# Patient Record
Sex: Female | Born: 1945 | Race: White | Hispanic: No | Smoking: Former smoker
Health system: Southern US, Community
[De-identification: ages and names within clinical notes are randomized; demographics above are authoritative.]

## PROBLEM LIST (undated history)

## (undated) DIAGNOSIS — N289 Disorder of kidney and ureter, unspecified: Secondary | ICD-10-CM

## (undated) DIAGNOSIS — E669 Obesity, unspecified: Secondary | ICD-10-CM

## (undated) DIAGNOSIS — Z5189 Encounter for other specified aftercare: Secondary | ICD-10-CM

## (undated) DIAGNOSIS — N189 Chronic kidney disease, unspecified: Secondary | ICD-10-CM

## (undated) DIAGNOSIS — I509 Heart failure, unspecified: Secondary | ICD-10-CM

## (undated) DIAGNOSIS — Z950 Presence of cardiac pacemaker: Secondary | ICD-10-CM

## (undated) DIAGNOSIS — I442 Atrioventricular block, complete: Secondary | ICD-10-CM

## (undated) DIAGNOSIS — J439 Emphysema, unspecified: Secondary | ICD-10-CM

## (undated) DIAGNOSIS — I428 Other cardiomyopathies: Secondary | ICD-10-CM

## (undated) DIAGNOSIS — I4811 Longstanding persistent atrial fibrillation: Secondary | ICD-10-CM

## (undated) DIAGNOSIS — E079 Disorder of thyroid, unspecified: Secondary | ICD-10-CM

## (undated) DIAGNOSIS — M109 Gout, unspecified: Secondary | ICD-10-CM

## (undated) DIAGNOSIS — J8489 Other specified interstitial pulmonary diseases: Secondary | ICD-10-CM

## (undated) DIAGNOSIS — H269 Unspecified cataract: Secondary | ICD-10-CM

## (undated) DIAGNOSIS — K5792 Diverticulitis of intestine, part unspecified, without perforation or abscess without bleeding: Secondary | ICD-10-CM

## (undated) DIAGNOSIS — M1990 Unspecified osteoarthritis, unspecified site: Secondary | ICD-10-CM

## (undated) DIAGNOSIS — I484 Atypical atrial flutter: Secondary | ICD-10-CM

## (undated) DIAGNOSIS — R0902 Hypoxemia: Secondary | ICD-10-CM

## (undated) DIAGNOSIS — J45909 Unspecified asthma, uncomplicated: Secondary | ICD-10-CM

## (undated) DIAGNOSIS — J9383 Other pneumothorax: Secondary | ICD-10-CM

## (undated) DIAGNOSIS — J449 Chronic obstructive pulmonary disease, unspecified: Secondary | ICD-10-CM

## (undated) DIAGNOSIS — I10 Essential (primary) hypertension: Secondary | ICD-10-CM

## (undated) HISTORY — DX: Disorder of thyroid, unspecified: E07.9

## (undated) HISTORY — PX: TUBAL LIGATION: SHX77

## (undated) HISTORY — DX: Atrioventricular block, complete: I44.2

## (undated) HISTORY — PX: CHOLECYSTECTOMY: SHX55

## (undated) HISTORY — DX: Obesity, unspecified: E66.9

## (undated) HISTORY — DX: Longstanding persistent atrial fibrillation: I48.11

## (undated) HISTORY — DX: Heart failure, unspecified: I50.9

## (undated) HISTORY — DX: Encounter for other specified aftercare: Z51.89

## (undated) HISTORY — DX: Unspecified cataract: H26.9

## (undated) HISTORY — DX: Hypoxemia: R09.02

## (undated) HISTORY — PX: APPENDECTOMY: SHX54

## (undated) HISTORY — DX: Other cardiomyopathies: I42.8

## (undated) HISTORY — DX: Gout, unspecified: M10.9

## (undated) HISTORY — DX: Atypical atrial flutter: I48.4

## (undated) HISTORY — DX: Diverticulitis of intestine, part unspecified, without perforation or abscess without bleeding: K57.92

## (undated) HISTORY — PX: COLON SURGERY: SHX602

## (undated) HISTORY — DX: Essential (primary) hypertension: I10

## (undated) HISTORY — DX: Chronic kidney disease, unspecified: N18.9

## (undated) HISTORY — DX: Emphysema, unspecified: J43.9

## (undated) HISTORY — DX: Other pneumothorax: J93.83

## (undated) HISTORY — DX: Disorder of kidney and ureter, unspecified: N28.9

## (undated) HISTORY — PX: ABDOMINAL HYSTERECTOMY: SHX81

## (undated) HISTORY — PX: CARDIAC CATHETERIZATION: SHX172

## (undated) HISTORY — DX: Unspecified osteoarthritis, unspecified site: M19.90

## (undated) HISTORY — PX: HERNIA REPAIR: SHX51

---

## 1995-06-09 HISTORY — PX: BREAST BIOPSY: SHX20

## 2004-11-25 ENCOUNTER — Ambulatory Visit: Payer: Self-pay | Admitting: Obstetrics and Gynecology

## 2005-01-06 ENCOUNTER — Ambulatory Visit: Payer: Self-pay | Admitting: Physician Assistant

## 2005-01-26 ENCOUNTER — Other Ambulatory Visit: Payer: Self-pay

## 2005-02-02 ENCOUNTER — Ambulatory Visit: Payer: Self-pay | Admitting: Unknown Physician Specialty

## 2005-02-11 ENCOUNTER — Encounter: Payer: Self-pay | Admitting: Unknown Physician Specialty

## 2005-03-08 ENCOUNTER — Encounter: Payer: Self-pay | Admitting: Unknown Physician Specialty

## 2005-04-08 ENCOUNTER — Encounter: Payer: Self-pay | Admitting: Unknown Physician Specialty

## 2005-05-08 ENCOUNTER — Encounter: Payer: Self-pay | Admitting: Unknown Physician Specialty

## 2005-12-23 ENCOUNTER — Ambulatory Visit: Payer: Self-pay | Admitting: Internal Medicine

## 2006-12-28 ENCOUNTER — Ambulatory Visit: Payer: Self-pay | Admitting: Internal Medicine

## 2008-07-17 ENCOUNTER — Ambulatory Visit: Payer: Self-pay | Admitting: Internal Medicine

## 2008-12-25 ENCOUNTER — Ambulatory Visit: Payer: Self-pay | Admitting: Urology

## 2009-08-01 ENCOUNTER — Ambulatory Visit: Payer: Self-pay | Admitting: Internal Medicine

## 2009-09-09 ENCOUNTER — Observation Stay: Payer: Self-pay | Admitting: Internal Medicine

## 2009-10-22 ENCOUNTER — Ambulatory Visit: Payer: Self-pay | Admitting: Pain Medicine

## 2009-11-07 ENCOUNTER — Ambulatory Visit: Payer: Self-pay | Admitting: Pain Medicine

## 2009-11-28 ENCOUNTER — Ambulatory Visit: Payer: Self-pay | Admitting: Pain Medicine

## 2009-12-16 ENCOUNTER — Ambulatory Visit: Payer: Self-pay | Admitting: Pain Medicine

## 2010-09-30 ENCOUNTER — Ambulatory Visit: Payer: Self-pay | Admitting: Internal Medicine

## 2011-01-07 ENCOUNTER — Ambulatory Visit: Payer: Self-pay | Admitting: Cardiovascular Disease

## 2011-02-27 ENCOUNTER — Emergency Department: Payer: Self-pay | Admitting: Emergency Medicine

## 2011-06-09 DIAGNOSIS — J9383 Other pneumothorax: Secondary | ICD-10-CM

## 2011-06-09 HISTORY — DX: Other pneumothorax: J93.83

## 2011-12-22 LAB — CBC WITH DIFFERENTIAL/PLATELET
Basophil #: 0.1 10*3/uL (ref 0.0–0.1)
Eosinophil %: 0.9 %
Lymphocyte #: 0.9 10*3/uL — ABNORMAL LOW (ref 1.0–3.6)
Lymphocyte %: 10.3 %
Monocyte %: 6.9 %
Neutrophil #: 6.8 10*3/uL — ABNORMAL HIGH (ref 1.4–6.5)
Neutrophil %: 81.1 %
Platelet: 161 10*3/uL (ref 150–440)
RBC: 5.3 10*6/uL — ABNORMAL HIGH (ref 3.80–5.20)
RDW: 14.9 % — ABNORMAL HIGH (ref 11.5–14.5)
WBC: 8.3 10*3/uL (ref 3.6–11.0)

## 2011-12-22 LAB — TSH: Thyroid Stimulating Horm: 0.525 u[IU]/mL

## 2011-12-22 LAB — BASIC METABOLIC PANEL
Anion Gap: 11 (ref 7–16)
BUN: 18 mg/dL (ref 7–18)
Calcium, Total: 9.5 mg/dL (ref 8.5–10.1)
EGFR (African American): >60
EGFR (Non-African Amer.): >60
Glucose: 101 mg/dL — ABNORMAL HIGH (ref 65–99)
Potassium: 3.6 mmol/L (ref 3.5–5.1)
Sodium: 143 mmol/L (ref 136–145)

## 2011-12-23 LAB — APTT: Activated PTT: 24.8 secs (ref 23.6–35.9)

## 2011-12-23 LAB — TROPONIN I: Troponin-I: <0.02 ng/mL

## 2011-12-24 ENCOUNTER — Inpatient Hospital Stay: Payer: Self-pay | Admitting: Internal Medicine

## 2011-12-24 LAB — APTT: Activated PTT: 26.4 secs (ref 23.6–35.9)

## 2011-12-24 LAB — CREATININE, SERUM
EGFR (African American): 56 — ABNORMAL LOW
EGFR (Non-African Amer.): 49 — ABNORMAL LOW

## 2012-01-04 ENCOUNTER — Ambulatory Visit: Payer: Self-pay | Admitting: Internal Medicine

## 2012-01-19 ENCOUNTER — Other Ambulatory Visit: Payer: Self-pay | Admitting: Cardiovascular Disease

## 2012-02-19 ENCOUNTER — Other Ambulatory Visit: Payer: Self-pay | Admitting: Internal Medicine

## 2012-02-19 LAB — PROTIME-INR
INR: 5.5
Prothrombin Time: 49.3 secs — ABNORMAL HIGH (ref 11.5–14.7)

## 2012-03-17 ENCOUNTER — Ambulatory Visit: Payer: Self-pay | Admitting: Internal Medicine

## 2012-11-29 ENCOUNTER — Encounter: Payer: Self-pay | Admitting: Critical Care Medicine

## 2012-12-12 ENCOUNTER — Encounter: Payer: Self-pay | Admitting: Critical Care Medicine

## 2012-12-25 ENCOUNTER — Inpatient Hospital Stay: Payer: Self-pay | Admitting: Internal Medicine

## 2012-12-25 LAB — TROPONIN I
Troponin-I: <0.02 ng/mL
Troponin-I: <0.02 ng/mL

## 2012-12-25 LAB — URINALYSIS, COMPLETE
Blood: NEGATIVE
Ketone: NEGATIVE
Leukocyte Esterase: NEGATIVE
Protein: NEGATIVE
RBC,UR: <1 /HPF (ref 0–5)
WBC UR: <1 /HPF (ref 0–5)

## 2012-12-25 LAB — BASIC METABOLIC PANEL
Anion Gap: 10 (ref 7–16)
BUN: 35 mg/dL — ABNORMAL HIGH (ref 7–18)
Chloride: 97 mmol/L — ABNORMAL LOW (ref 98–107)
Co2: 26 mmol/L (ref 21–32)
Creatinine: 1.27 mg/dL (ref 0.60–1.30)
EGFR (African American): 51 — ABNORMAL LOW
EGFR (Non-African Amer.): 44 — ABNORMAL LOW
Osmolality: 279 (ref 275–301)

## 2012-12-25 LAB — CBC
HGB: 16.8 g/dL — ABNORMAL HIGH (ref 12.0–16.0)
MCHC: 34.5 g/dL (ref 32.0–36.0)
MCV: 86 fL (ref 80–100)
RDW: 15.4 % — ABNORMAL HIGH (ref 11.5–14.5)
WBC: 22.7 10*3/uL — ABNORMAL HIGH (ref 3.6–11.0)

## 2012-12-25 LAB — CK TOTAL AND CKMB (NOT AT ARMC): CK-MB: 0.6 ng/mL (ref 0.5–3.6)

## 2012-12-25 LAB — APTT: Activated PTT: 26.2 secs (ref 23.6–35.9)

## 2012-12-25 LAB — PROTIME-INR: Prothrombin Time: 14.7 secs (ref 11.5–14.7)

## 2012-12-26 LAB — BASIC METABOLIC PANEL
BUN: 25 mg/dL — ABNORMAL HIGH (ref 7–18)
Chloride: 100 mmol/L (ref 98–107)
Co2: 24 mmol/L (ref 21–32)
EGFR (African American): >60
EGFR (Non-African Amer.): 58 — ABNORMAL LOW
Osmolality: 278 (ref 275–301)
Potassium: 4.1 mmol/L (ref 3.5–5.1)
Sodium: 133 mmol/L — ABNORMAL LOW (ref 136–145)

## 2012-12-26 LAB — HEMOGLOBIN A1C: Hemoglobin A1C: 6.6 % — ABNORMAL HIGH (ref 4.2–6.3)

## 2012-12-26 LAB — CBC WITH DIFFERENTIAL/PLATELET
Basophil #: 0 10*3/uL (ref 0.0–0.1)
Basophil %: 0.2 %
Eosinophil %: 0 %
HCT: 41.4 % (ref 35.0–47.0)
HGB: 14.3 g/dL (ref 12.0–16.0)
Lymphocyte #: 0.4 10*3/uL — ABNORMAL LOW (ref 1.0–3.6)
Lymphocyte %: 2.9 %
MCH: 29.6 pg (ref 26.0–34.0)
MCV: 86 fL (ref 80–100)
Monocyte #: 0.2 x10 3/mm (ref 0.2–0.9)
Monocyte %: 1.1 %
Neutrophil #: 13.8 10*3/uL — ABNORMAL HIGH (ref 1.4–6.5)
Platelet: 189 10*3/uL (ref 150–440)
RDW: 14.7 % — ABNORMAL HIGH (ref 11.5–14.5)

## 2012-12-26 LAB — TSH: Thyroid Stimulating Horm: 0.102 u[IU]/mL — ABNORMAL LOW

## 2012-12-26 LAB — PRO B NATRIURETIC PEPTIDE: B-Type Natriuretic Peptide: 122 pg/mL (ref 0–125)

## 2012-12-26 LAB — CK TOTAL AND CKMB (NOT AT ARMC): CK-MB: <0.5 ng/mL — ABNORMAL LOW (ref 0.5–3.6)

## 2012-12-27 LAB — CBC WITH DIFFERENTIAL/PLATELET
Basophil #: 0 10*3/uL (ref 0.0–0.1)
Basophil %: 0.1 %
Eosinophil #: 0 10*3/uL (ref 0.0–0.7)
Eosinophil %: 0 %
HCT: 40.2 % (ref 35.0–47.0)
HGB: 13.7 g/dL (ref 12.0–16.0)
Lymphocyte #: 0.5 10*3/uL — ABNORMAL LOW (ref 1.0–3.6)
Lymphocyte %: 2.4 %
MCH: 29.7 pg (ref 26.0–34.0)
MCV: 87 fL (ref 80–100)
Monocyte #: 0.4 x10 3/mm (ref 0.2–0.9)
Monocyte %: 1.9 %
Neutrophil %: 95.6 %
Platelet: 199 10*3/uL (ref 150–440)
RBC: 4.61 10*6/uL (ref 3.80–5.20)
RDW: 15.2 % — ABNORMAL HIGH (ref 11.5–14.5)

## 2012-12-27 LAB — BASIC METABOLIC PANEL
BUN: 26 mg/dL — ABNORMAL HIGH (ref 7–18)
Calcium, Total: 9.7 mg/dL (ref 8.5–10.1)
Creatinine: 1.52 mg/dL — ABNORMAL HIGH (ref 0.60–1.30)
EGFR (Non-African Amer.): 36 — ABNORMAL LOW
Glucose: 402 mg/dL — ABNORMAL HIGH (ref 65–99)
Potassium: 3.9 mmol/L (ref 3.5–5.1)
Sodium: 129 mmol/L — ABNORMAL LOW (ref 136–145)

## 2012-12-27 LAB — TSH: Thyroid Stimulating Horm: 0.069 u[IU]/mL — ABNORMAL LOW

## 2012-12-28 LAB — BASIC METABOLIC PANEL
Anion Gap: 12 (ref 7–16)
BUN: 32 mg/dL — ABNORMAL HIGH (ref 7–18)
Calcium, Total: 10.4 mg/dL — ABNORMAL HIGH (ref 8.5–10.1)
Chloride: 95 mmol/L — ABNORMAL LOW (ref 98–107)
Glucose: 391 mg/dL — ABNORMAL HIGH (ref 65–99)
Osmolality: 284 (ref 275–301)
Sodium: 130 mmol/L — ABNORMAL LOW (ref 136–145)

## 2012-12-28 LAB — CBC WITH DIFFERENTIAL/PLATELET
Basophil #: 0 10*3/uL (ref 0.0–0.1)
Eosinophil #: 0 10*3/uL (ref 0.0–0.7)
HGB: 13.4 g/dL (ref 12.0–16.0)
Lymphocyte %: 1.9 %
Monocyte #: 0.5 x10 3/mm (ref 0.2–0.9)
Monocyte %: 2.1 %

## 2012-12-30 LAB — CULTURE, BLOOD (SINGLE)

## 2013-01-06 ENCOUNTER — Encounter: Payer: Self-pay | Admitting: Critical Care Medicine

## 2013-02-06 ENCOUNTER — Encounter: Payer: Self-pay | Admitting: Critical Care Medicine

## 2013-03-08 ENCOUNTER — Encounter: Payer: Self-pay | Admitting: Critical Care Medicine

## 2013-06-13 ENCOUNTER — Ambulatory Visit: Payer: Self-pay | Admitting: Cardiovascular Disease

## 2013-07-20 HISTORY — PX: ATRIAL FIBRILLATION ABLATION: SHX5732

## 2013-10-24 ENCOUNTER — Ambulatory Visit: Payer: Self-pay | Admitting: Family Medicine

## 2013-11-01 HISTORY — PX: OTHER SURGICAL HISTORY: SHX169

## 2014-08-03 ENCOUNTER — Emergency Department: Payer: Self-pay | Admitting: Emergency Medicine

## 2014-08-07 ENCOUNTER — Encounter: Admit: 2014-08-07 | Disposition: A | Discharge status: Home / Self Care | Payer: Self-pay

## 2014-09-07 ENCOUNTER — Encounter: Admit: 2014-09-07 | Disposition: A | Discharge status: Home / Self Care | Payer: Self-pay

## 2014-09-25 NOTE — Consult Note (Signed)
PATIENT NAME:  Kathleen [name], Kathleen [name] A MR#:  [id number] DATE OF BIRTH:  19-Apr-1946  DATE OF CONSULTATION:  12/25/2011  REFERRING PHYSICIAN:   CONSULTING PHYSICIAN:  Yevonne Pax, MD  REASON FOR CONSULTATION:  Interstitial lung disease and asthma.  HISTORY OF PRESENT ILLNESS: 69 year old female has a history of asthma in the past. She has had multiple cardiac issues. She was seen in the office of the cardiologist and was noted to have atrial fibrillation. She had an echocardiogram showing normal LV function. She was noted to have rapid ventricular response. She was also having complaint of chest pain and therefore was admitted. She came in to the Emergency Room, had a CT scan of the chest done to look for pulmonary embolism, which was negative for pulmonary embolism but there were some interstitial changes noted which were suggestive of some interstitial lung disease, and for that reason I was asked to see her.  She said she is doing a little bit better. She has no current chest pain. Her shortness breath is somewhat better. She had denies any cough or congestion.   PAST MEDICAL HISTORY: Significant for asthma, hypertension, hyperlipidemia, hypothyroidism, and atrial fibrillation.   ALLERGIES: Cleocin, Demerol, penicillin, and tetracycline.   FAMILY HISTORY: Positive for breast cancer and chronic obstructive pulmonary disease.   SOCIAL HISTORY: She does have a history of smoking about 25 years ago.  She was approximately a pack a day smoker.   MEDICATIONS: Medications are reviewed on the electronic medical record.   REVIEW OF SYSTEMS: A full 12-point review of systems was performed and was unremarkable other than what is noted above in the history of present illness.   PHYSICAL EXAMINATION:  VITAL SIGNS: At time that she was seen, temperature 97.8,  pulse 76, respiratory rate 18, blood pressure 135/71, sats 94%.   NECK: Supple. No JVD. No adenopathy. No thyromegaly.   HEENT: Extraocular movements  grossly intact.   CHEST: Good air entry bilaterally. No rhonchi. A few basilar crackles were noted.   ABDOMEN: Soft and nontender.   EXTREMITIES: Without cyanosis or clubbing. Pulses equal.   NEURO:  She was awake and alert, moving all four extremities. Gait appeared to be normal.   SKIN: No acute rashes.   IMPRESSION:  1. 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  2. Asthma.   PLAN: She is doing much better. She should be continued on inhalers although I would not recommend doing Advair and Flovent. She can continue with Advair. She will continue with nebulizers. In addition we can potentially look at adding Daliresp. She will need PFTs as an outpatient. I will be happy to see her in the office in about 10 days. An appointment will be made. Thank you for consulting me in the care of this patient.  ____________________________ Yevonne Pax, MD sak:bjt D: 12/25/2011 13:10:10 ET T: 12/25/2011 14:23:42 ET JOB#: [id number]  cc: Yevonne Pax, MD, <Dictator> Yevonne Pax MD ELECTRONICALLY SIGNED 01/02/2012 13:30

## 2014-09-25 NOTE — Discharge Summary (Signed)
PATIENT NAME:  Kathleen Kathleen [name], Kathleen Kathleen [name] MR#:  [id number] DATE OF BIRTH:  06-07-1946  DATE OF ADMISSION:  12/24/2011 DATE OF DISCHARGE:  12/25/2011  ADMITTING DIAGNOSIS: Atrial fibrillation.   DISCHARGE DIAGNOSES:  1. Atrial fibrillation, converted to sinus rhythm now on amiodarone.  2. Chest pain, likely musculoskeletal.  3. Asthma exacerbation.  4. Questionable interstitial lung disease versus fibrosis.  5. Acute bronchitis, sinusitis.  6. Hypothyroidism.  7. Hyperlipidemia.  8. Obesity.  9. Elevated blood pressure on exertion, possibly stress related.   DISCHARGE CONDITION: Fair.   DISCHARGE MEDICATIONS: Patient is to resume her outpatient medications which are:  1. Fish oil 1 gram once daily.  2. Flovent HFA 44 mcg 1 puff twice daily.  3. Levaquin 500 mg p.o. to be taken until 12/28/2011.   4. VESIcare 5 mg p.o. daily as needed.  5. Hydrochlorothiazide p.o. once daily.  6. Singulair 10 mg p.o. daily.  7. Advair Diskus 250/50, 1 puff twice daily.  8. Lovastatin 40 mg p.o. daily.  9. Meloxicam 15 mg p.o. daily.  10. Multivitamins once daily.  11. Dexilant 6 mg p.o. daily.  12. Aspirin 325 mg p.o. daily.   ADDITIONAL MEDICATIONS:  1. Amiodarone 400 mg p.o. twice daily. 2. Xopenex Atrovent nebulizers via nebulizer machine every four hours as needed.  3. Synthroid 88 mcg p.o. daily.  4. Tussionex 5 mL p.o. twice daily.  5. Robitussin 10 mL q.4 hours as needed.  6. Prednisone 60 mg p.o. once on 12/26/2011, then taper x 10 mg every two days until stopped.  7. Humibid LA 600 mg p.o. twice daily.   HOME OXYGEN: None.   DIET: 2 grams salt, low fat, low cholesterol, regular consistency.   ACTIVITY LIMITATIONS: As tolerated.   FOLLOW UP: Follow-up appointment with Dr. Dario Guardian in two days after discharge, Dr. Adrian Blackwater will be scheduled. Dr. Freda Munro in one week after discharge.    CONSULTANTS:  1. Dr. Adrian Blackwater. 2. Dr. Freda Munro. 3. Care management.  4. Child psychotherapist.    HISTORY OF PRESENT ILLNESS: Patient is Kathleen [name] 69 year old Caucasian female with past medical history significant for history of asthma who presented to the hospital with complaints of shortness of breath as well as palpitations. Please refer to Dr. Hilbert Odor admission note on 12/22/2011. Apparently patient was seen by Dr. Adrian Blackwater at the cardiologist's office. She was complaining of some palpitations as well as shortness of breath. She had EKG done which revealed atrial fibrillation which was recurrent. Apparently patient had previous history of atrial fibrillation, however, she had been in normal sinus rhythm for Kathleen [name] while. She had echocardiogram done which revealed normal left ventricular function and no thrombus. Her atrial fibrillation rate went to low 100s that is when she was sent to the hospital for admission. She was also complaining of some intermittent chest pains which were going on and off for the past few days and shortness of breath as well as dizziness and palpitations. On arrival to the hospital patient actually converted back to sinus rhythm spontaneously, however, because of her discomfort in her chest she was admitted to the hospital for observation. Initially on arrival to the hospital patient's temperature was 99.1, pulse 78, respiration rate 27, blood pressure 141/65, saturation 96% on room air. Physical examination was unremarkable.  LABORATORY, DIAGNOSTIC AND RADIOLOGICAL DATA: CT scan of chest with IV contrast 12/24/2011 revealed no evidence of pulmonary arterial embolic disease. Interstitial infiltrate likely representing Kathleen [name] component of pulmonary edema, atelectasis versus infiltrate within lung  bases, indeterminant pulmonary nodules and surveillance evaluation is recommended.   Patient's lab data 12/22/2011 showed glucose 101, otherwise BMP was unremarkable. Patient's cardiac enzymes x2 were normal. TSH normal at 0.525. White blood cell count was normal at 8.3, hemoglobin 14.7,  platelet count 161, absolute neutrophil count 6.8. D-dimer less than 0.22. ABGs were done on room air, showed pH 7.48, pCO2 35, pO2 63, saturation 93.4%.   HOSPITAL COURSE: Patient was admitted to the hospital for further evaluation. She remained in sinus rhythm on 12/23/2011 and decision was made to discharge her on high dose of amiodarone, however, while she was walking and taking inhalation therapy she again converted into atrial fibrillation that is why her discharge was not complete. She was also noted to be hypoxic on her ABGs and further evaluation with CT scanning was entertained. Patient had to receive contrast allergy medications prior to her CT scan she was held till 12/24/2011. CT scan revealed possible infiltrate in lung bases as well as interstitial infiltrate, questionable pulmonary edema and indeterminate pulmonary nodules. She was given one dose of Lasix at 20 mg IV dose after which she diuresed Kathleen [name] little, however, her shortness of breath continued as well as chest pain as well as dizziness. She was seen by Dr. Freda Munro on 12/24/2013. He felt that patient very likely has interstitial lung disease, probable interstitial pneumonia versus fibrosis as well as asthma. He felt that she was doing much better with therapy, she should be continued on inhalers, although he felt that he would not recommended dosing together Advair as well as Flovent. He recommended to continue just Advair alone as well as continue nebulizers. He also felt that potentially they may be adding Daliresp. She also would need PFTs as outpatient. Talked to Dr. Freda Munro and he recommended follow up with him in his office in approximately 10 days after discharge. An appointment will be made prior to discharge from the hospital. On day of discharge patient felt satisfactory, did not complain of any significant discomfort. She was ambulated around nursing station and her oxygenation remained stable. Her vital signs: Temperature  97.8, pulse 76, respiration rate 18, blood pressure 135/71, saturation 92% to 94% on room air at rest as well as during ambulation.   TIME SPENT: 40 minutes.   ____________________________ Katharina Caper, MD rv:cms D: 12/25/2011 17:32:56 ET T: 12/28/2011 09:16:14 ET  JOB#: [id number] cc: Katharina Caper, MD, <Dictator> Marlyn Corporal, MD Guillermo Nehring MD ELECTRONICALLY SIGNED 12/30/2011 16:23

## 2014-09-25 NOTE — Consult Note (Signed)
PATIENT NAME:  Kathleen [name], Kathleen [name] MR#:  [id number] DATE OF BIRTH:  1946-05-18  DATE OF CONSULTATION:  12/22/2011  REFERRING PHYSICIAN:   CONSULTING PHYSICIAN:  Laurier Nancy, MD  HISTORY: I saw this patient, Kathleen [name], in the office today but on follow up I just came to see in the hospital. She is [name] 69 year old white female with [name] past medical history of hypertension, asthma, hyperlipidemia, hypothyroidism and atrial fibrillation. She was not feeling well for the past few days. She was having some palpitations and chest tightness and short of breath and dizziness thus we evaluated her in the office where she had an EKG done which showed sinus rhythm with frequent PVCs and APCs and the follow-up EKG shows atrial fibrillation. Echocardiogram was done which showed normal chamber size, normal left ventricular systolic function, mild mitral regurgitation, mild tricuspid regurgitation. Normal wall motion. She was admitted to the hospital. Right now she just converted to sinus rhythm at 78 beats per minute.   PHYSICAL EXAMINATION: Unremarkable. Vitals are stable.   ASSESSMENT AND PLAN: Patient probably can go home tomorrow. Advised increasing the amiodarone to 400 b.i.d. and also get TSH level. I think thyroid disease is causing her paroxysmal atrial fibrillation. She was in atrial fibrillation and now is in sinus rhythm. Her Thyroxine may need to be adjusted because she may not be taking enough. Normally Dr. Dario Guardian takes care of her hypothyroidism.   ____________________________ Laurier Nancy, MD sak:cms D: 12/22/2011 15:57:58 ET T: 12/22/2011 16:11:03 ET JOB#: [id number]  cc: Laurier Nancy, MD, <Dictator>  Laurier Nancy MD ELECTRONICALLY SIGNED 12/24/2011 13:13

## 2014-09-25 NOTE — H&P (Signed)
PATIENT NAME:  Kathleen Kathleen [name], Kathleen Kathleen [name] MR#:  [id number] DATE OF BIRTH:  1946-05-10  DATE OF ADMISSION:  12/22/2011  PRIMARY CARE PHYSICIAN:  Dr. Dario Guardian  CARDIOLOGIST: Dr. Adrian Blackwater   CHIEF COMPLAINT: Shortness of breath, palpitations.   HISTORY OF PRESENT ILLNESS: This is Kathleen [name] 69 year old female who went for Kathleen [name] routine followup to her cardiologist's office. She had been complaining of some palpitations and shortness of breath. She had an EKG done which showed atrial fibrillation. She has Kathleen [name] previous history of atrial fibrillation, but had been in normal sinus rhythm for quite Kathleen [name] while. She underwent an echocardiogram which showed normal LV function and no thrombus. They did Kathleen [name]  repeat EKG which still showed atrial fibrillation and the heart rates had gone up to the low 100s. She was therefore sent over to the hospital for admission. The patient also complained of some intermittent chest pain that has been going on for the past few days. She also complains of shortness of breath, which is mostly exertional in nature, but no nausea, no vomiting, no diaphoresis. Positive palpitations and positive dizziness. Upon coming to the hospital, the patient had actually converted back to Kathleen [name] sinus rhythm and presently remains in Kathleen [name] sinus rhythm. She is being admitted under observation for her paroxysmal atrial fibrillation.   REVIEW OF SYSTEMS:  CONSTITUTIONAL: No documented fever. No weight gain or weight loss. EYES: No blurred or double vision. ENT: No tinnitus no postnasal drip. No redness of the oropharynx. RESPIRATORY: No cough, no wheeze, no hemoptysis. CARDIOVASCULAR: Positive palpitations. No chest pain, no orthopnea, no syncope. GI: No nausea, no vomiting, no diarrhea, no abdominal pain, no melena, no hematochezia. GU: No dysuria or hematuria. ENDOCRINE: No polyuria or nocturia. No heat or cold intolerance. HEME: No anemia. No bruising. No bleeding. INTEGUMENT: No rashes. No lesions. MUSCULOSKELETAL: No arthritis, no  swelling, no gout. NEUROLOGIC: No numbness, no ataxia, no ataxia, no seizure-type activity. PSYCH: No anxiety or insomnia, no ADD.   PAST MEDICAL HISTORY:  1. Paroxysmal atrial fibrillation.  2. History of asthma.  3. Hypertension.  4. Hyperlipidemia.  5. Hypothyroidism.   ALLERGIES: Cleocin, Demerol, penicillin, tetracycline.   SOCIAL HISTORY: No smoking. No alcohol abuse. No illicit drug abuse. Lives at home by herself.   FAMILY HISTORY: The patient's mother had pancreatic and breast cancer. Father had emphysema.   CURRENT MEDICATIONS:  1. Advair 250/50 1 puff b.i.d.  2. Amiodarone 200 mg daily.  3. Aspirin 325 mg daily.  4. Dexilant 60 mg daily.  5. Fish oil 1 tab daily.  6. Flovent 2 puffs b.i.d.  7. Hydrochlorothiazide 25 mg daily.  8. Levaquin 500 mg daily.  9. Synthroid 25 mcg daily.  10. Lovastatin 40 mg daily.  11. Meloxicam 15 mg daily.  12. Multivitamin daily.  13. Vesicare 5 mg b.i.d.  14. Singulair 10 mg daily.   PHYSICAL EXAMINATION: VITAL SIGNS: Temperature 99.1, pulse 78, respirations 27, blood pressure 141/65, sats 96% on room air.   GENERAL: She is Kathleen [name] pleasant appearing female in no apparent distress.   HEENT: Atraumatic, normocephalic. Extraocular muscles are intact. Pupils are equal and reactive to light. Sclerae are anicteric. No conjunctival injection. No pharyngeal erythema.   NECK: Supple. No jugular venous distention, no bruits, no lymphadenopathy, no thyromegaly.   HEART: Regular rate and rhythm. No murmurs, rubs, or clicks.   LUNGS: Clear to auscultation bilaterally. No rales, no rhonchi, no wheezes.   ABDOMEN: Soft, flat, nontender, nondistended. Has good bowel sounds. No hepatosplenomegaly appreciated.  EXTREMITIES: No evidence of any cyanosis, clubbing, or peripheral edema. Has +2 pedal and radial pulses bilaterally.   NEUROLOGIC: The patient is alert, awake, and oriented times three with no focal motor or sensory deficits appreciated  bilaterally.   SKIN: Moist and warm with no rash appreciated.   LYMPHATIC: There is no cervical or axillary lymphadenopathy.  LABORATORY, DIAGNOSTIC, AND RADIOLOGICAL DATA: Currently pending.   ASSESSMENT AND PLAN: This is Kathleen [name] 69 year old female with past medical history of paroxysmal atrial fibrillation, hypertension, asthma, hyperlipidemia, and hypothyroidism who presents to the hospital. She was noted to be in rapid atrial fibrillation and feeling short of breath.  1. Atrial fibrillation: The patient has Kathleen [name] history of atrial fibrillation but had been in normal sinus rhythm previously. Prior to coming to the hospital she actually converted back to Kathleen [name] sinus rhythm. She was going to be admitted to the Intensive Care Unit for Kathleen [name] possible chemical cardioversion with amiodarone. I discussed with Dr. Welton Flakes and told him that the patient had converted back to Kathleen [name] sinus rhythm and he recommended starting the patient on amiodarone 400 mg b.i.d. and observing overnight. She already had an echo done in his office, which was normal. Therefore, I will not repeat that presently. I will continue aspirin for now and get Kathleen [name] cardiology consult as mentioned.  2. Asthma: No acute exacerbation. We will continue her Flovent and Advair. She is already on some Levaquin for underlying sinusitis and bronchitis and I will continue that for now.  3. Hypertension: Continue hydrochlorothiazide.  4. Hyperlipidemia: Continue lovastatin.  5. Hypothyroidism: Continue Synthroid.   CODE STATUS: The patient is Kathleen [name] FULL CODE.       TIME SPENT: 45 minutes.    ____________________________ Rolly Pancake. Cherlynn Kaiser, MD vjs:bjt D: 12/22/2011 15:09:26 ET T: 12/22/2011 15:38:29 ET JOB#: [id number]  cc: Rolly Pancake. Cherlynn Kaiser, MD, <Dictator> Marlyn Corporal, MD Houston Siren MD ELECTRONICALLY SIGNED 12/23/2011 14:03

## 2014-09-28 NOTE — Discharge Summary (Signed)
PATIENT NAME:  Kathleen [name], Kathleen [name] MR#:  [id number] DATE OF BIRTH:  [birth year]  DISCHARGE SUMMARY TRANSFER  DATE OF ADMISSION:  12/25/2012 DATE OF DISCHARGE:  12/28/2012  Possible transfer to [facility].   REASON FOR ADMISSION:  Shortness of breath and chest pressure.   FINAL DIAGNOSES: 1.  Acute on chronic respiratory failure.  2.  Chronic obstructive pulmonary disease.  3.  Paroxysmal atrial fibrillation with atrial flutter.  4.  History of chronic lower extremity swelling, without any history of congestive heart failure with normal BNP.  5.  Leukocytosis secondary to steroid use.  6.  Hyperglycemia secondary to steroid use.  7.  Hyperlipidemia.  8.  Hypothyroidism. The patient has low TSH, likely related to stress as she has normal T4, T3.  Continue treatment. Consider decreasing dose of thyroid hormone at some point, although her T3 and T4 are on the normal low rate.  9.  Atrial fibrillation.  10.  History of tachycardia, likely sinus tachycardia versus atrial fibrillation of admission.  11.  She has rapid ventricular response that is resolved.   CURRENT MEDICATIONS INCLUDE:  1.  Diltiazem ER 180 mg daily.  2.  Docusate with Senna 50/8.6 mg, 2 tablets once a day.  3.  Flonase nasal spray 2 sprays both nostrils daily.  4.  Guaifenesin 600 mg every 12 hours.  5.  Levofloxacin 250 mg every 24 hours.  6.  Levothyroxine 0..075 mg once daily.  7.  Lovastatin 40 mg once daily.  8.  Methylprednisolone 40 mg IV push every 6 hours.  9.  Singulair 10 mg once daily.  10.  Multaq 400 mg twice daily.   11.  Omega-3 fatty acids 1 gram daily.  12.  Omeprazole 20 mg twice daily.  13.  Aspirin 81 mg once daily.  14.  Xarelto 20 mg once a day.  15.  Insulin sliding scale.  16.  Albuterol .  17.  Pulmicort 0.5 mg twice daily.  18.  Spiriva 1 capsule daily.   HOSPITAL COURSE AND IMPORTANT TESTS:  This is a 69 year old female admitted on 12/25/2012 with a history of previous diagnosis  of BOOP and asthma who reports she goes to [facility] for her care. She was currently taking prednisone and was doing an attempt of tapered down her dose when. She was down to 30 mg a week, she had progressive worsening of shortness of breath.   Started to have some wheezing and dry cough, chills, but no fevers.   The patient has history of chronic respiratory failure; on chronic oxygen 1 to 2 liters at home, but was told to stop using it within the last month and then she was told to start using oxygen only at nighttime. As the shortness of breath got worse, the patient checked her oxygen saturations, and they apparently dropped and whenever that happens, she was _____ to have some pressure on her chest, for what she came to the ER. In the ER, her heart rate was high. She has history of atrial fibrillation and atrial flutter. She was noted to have a heart rate of 149, for what she received 1 dose of IV Cardizem. She was in atrial fibrillation with RVR. Her heart rate now slowed down to the 80s.   Her pressure on the chest resolved just right after changing her heart rate. She did not have any other specific symptoms. The patient is taking Multaq for her atrial fibrillation and also diltiazem and so far she states that  she has occasional palpitations at home.   During the evaluation in the ER, the patient was found to have a pulse of 95 after Cardizem, respirations of 24, blood pressure 120/58 and a temperature of 97.9.   The patient looked stable with cushingoid appearance, obese. She had occasional wheezing with the diminished breath sounds and her recorded oxygen saturations were around 94 on 2 to 4 liters of oxygen.   Again, apparently early she had a desaturation of oxygen, for what she was admitted for treatment of this condition. As her problems go:  The patient has acute on chronic respiratory failure at this moment, is likely due to a flare of chronic obstructive pulmonary disease versus interstitial  lung disease, also BOOP.   She does not have any signs of BOOP on the x-rays right now. There are no signs of consolidations. No signs of pneumonia. She is afebrile. She has not had any fever.   It seems like her secretions have been increasing a little bit more, but they remain clear. No significant sputum or changes of color in the sputum.   The patient also had COPD as a diagnosis. Could be related to this condition. She has been diagnosed with asthma in the past.   She was put on Solu-Medrol IV and her condition improved drastically within the first 24 hours, but after those 24 hours there were not any further improvement.   The patient has a cardiologist and a pulmonologist at [facility]. The pulmonologist, Dr. Amanda Pea. The cardiologist is Dr. _____. I had discussions with the 2 of them about her care. So far, a couple of things to mention was:  1.  The possibility of this being congestive heart failure. She has had an echo before without any significant systolic abnormalities, nor diastolic abnormalities.  2.  The patient has a normal BNP or beta natriuretic peptide.  3.  Overall, her x-rays did not show any significant signs of pulmonary edema or vascular congestion.   The patient states that she has a weight gain of 5 pounds and prior to that she has noticed significant weight gain of 10 to 20 pounds.   At this moment, I think that that might be related to steroids rather than fluid retention, as the patient was not edematous.   She has history of edema of the lower extremity, which are more chronic, based likely on obesity and venous insufficiency. The patient at this moment does not have any significant water retention. Yesterday her legs were not examined. Today she has a little bit of edema, mostly on the left lower extremity. We talk about the possibility of these being a DVT. The patient is taking Xarelto. She has been taking it religiously and I do not think that that might be a real  reason why not to check for PE. I offered her to have a CT scan of the chest with IV contrast and an ultrasound of the lower extremity to rule out DVT, but after our long conversations, we decided the patient likely needs to go to [facility] anyway because it is her preference. Since the suspicion is low, we are going to allow [facility] to do those tests, unless she is going to stay here for a longer time. I might just set up the CT scan to be done later.   So, I think overall her condition has not worsened and has not improved significantly after 24 hours, but it did after the first encounter with the patient. She  had a really good improvement once she started getting steroids, but now she is just not getting any better, weaker as she _____ for what I think this might be more related to the chronic power of her condition, COPD likely related to asthma and it might take a while for her to get better. She had this same experience at [facility] wherever she had the BOOP and she took a long time to recovery.   She is requesting transfer. We are going to arrange it. Overall, the patient looks stable right now.   1.  Paroxysmal atrial fibrillation with atrial flutter. The patient came to the ER. At that moment, her heart rate was in the 150s, irregular, with atrial fibrillation, rapid ventricular rate, for what 1 dose of Cardizem was given. After that happened, the patient went back to normal sinus and heart rate in the 80s. No more events. She was on telemetry, but does not need it anymore.  2.  Hyperlipidemia. Continue lovastatin.  3.  Hyperglycemia secondary to steroids use. The patient has blood sugars in 200s, 300s. Prior to that, they were normal. She had a hemoglobin A1c that is 6.6, so this is likely related to steroids again. She is on insulin sliding scale.  4.  Hypothyroidism. The patient has history of taking levothyroxine for a while. Her TSH is 0.1 on admission, repeated 0.06. Her free thyroxine levels are normal,  on the normal low, for what this could be just related to a stress. We recommend just to follow in 3 to 4 weeks.   No changes to her levothyroxine at this moment. Her lactic acid was 3.2. Her urinalysis was negative. Her white count is increasing up to 2200 and this is likely due to steroid use.   Her sodium is 130 and it is corrected by glucose. It is back to normal.            The patient again requested transfer to [facility]. We are trying to arrange it at this moment.   I spent so far about 75 minutes with this patient and trying to arrange the transfer.   ____________________________ Felipa Furnace, MD rsg:NTS D: 12/28/2012 15:23:20 ET T: 12/28/2012 16:20:33 ET JOB#: [id number]  cc: Felipa Furnace, MD, <Dictator> Suzy Kugel Juanda Chance MD ELECTRONICALLY SIGNED 01/01/2013 0:33

## 2014-09-28 NOTE — Discharge Summary (Signed)
PATIENT NAME:  Kathleen [name], Kathleen [name] MR#:  [id number] DATE OF BIRTH:  10-31-1945  DATE OF ADMISSION:  12/25/2012 DATE OF DISCHARGE:  12/29/2012  ADDENDUM:    Dr. Auburn Bilberry dictating an addendum to the patient's discharge summary done by Dr. Mordecai Maes on 12/28/2012.   Please note the patient was kept in the hospital one more day, due to awaiting bed at [facility]. She was continued on IV Solu-Medrol, IV levofloxacin and nebulizers. The patient continues to be short of breath and dyspneic with any type of exertion. Today, she has a bed available. She will be transferred to [facility]. Copies of her chart and disk have been made.     TIME SPENT: 35 minutes spent with the patient today.    ____________________________ Lacie Scotts. Allena Katz, MD shp:cc D: 12/29/2012 14:42:00 ET T: 12/29/2012 17:25:10 ET JOB#: [id number]  cc: Tashay Bozich H. Allena Katz, MD, <Dictator> Charise Carwin MD ELECTRONICALLY SIGNED 01/09/2013 8:55

## 2014-09-28 NOTE — Consult Note (Signed)
PATIENT NAME:  Kathleen [name], Kathleen [name] MR#:  [id number] DATE OF BIRTH:  [birth year]  DATE OF CONSULTATION:  12/28/2012  CONSULTING PHYSICIAN:  Yevonne Pax, MD  REASON FOR CONSULTATION: Cryptogenic organizing pneumonia.  This is a 69 year old Caucasian female who has past medical history significant for cryptogenic organizing pneumonia. She has been on chronic steroids. She is followed at [facility] for the diagnosis. She basically presented to the hospital with increasing shortness of breath. She has been on a recent taper of steroids. The patient had been having some cough. No hemoptysis noted. She has also been having some tightness in her chest. When she was seen in the Emergency Room, she was noted to have rapid ventricular response atrial fibrillation and she was given Cardizem, which took care of the high heart rate. She says that over the past couple of days, she really feels that she has not improved and in fact that she might be a little bit worse.   PAST MEDICAL HISTORY: Significant for cryptogenic organizing pneumonia, hypertension, hyperlipidemia and hypothyroidism.   ALLERGIES: NOTED TO DEMEROL, CLINDAMYCIN, PENICILLIN AND TETRACYCLINE.   FAMILY HISTORY: Noncontributory to the present.   SOCIAL HISTORY: She is a nonsmoker, does not drink or use alcohol.   MEDICATIONS: Reviewed on the electronic medical record.   REVIEW OF SYSTEMS: A complete 12-point review of systems is performed and is unremarkable other than what is noted above. In addition, it is positive for weight gain. She also has also moon facies. She has had the cough and some chest pressure.   PHYSICAL EXAMINATION: GENERAL: At the time that she was evaluated, she was awake and alert, sitting upright, getting a nebulizer treatment.  VITAL SIGNS: Temperature was 97.9, pulse 77, respiratory rate 18. Blood pressure was 152/85. Sats were 95%. She was on 2 liters nasal cannula.  NECK: Appeared to be supple.  There was no JVD and no adenopathy, no thyromegaly.  HEENT: The patient's face had the moon facial appearance. The extraocular movements were intact.  CHEST: Good air entry bilaterally. Did not appreciate any rhonchi. There were no rales. Expansion was equal.  CARDIOVASCULAR: S1, S2 normal. Regular rhythm. No gallop or rub.  ABDOMEN: Soft, obese. No hepatosplenomegaly noted.  NEUROLOGIC: She was awake and alert, moving all 4 extremities.  EXTREMITIES: Showed some edema. Pulses were equal.  SKIN: Without any acute rashes.  MUSCULOSKELETAL: She had no active synovitis.  LABORATORY, DIAGNOSTIC AND RADIOLOGICAL DATA: White count was 22.6, hemoglobin 13.4; hematocrit was 39.2. Chemistries showed a glucose of 391, BUN 32, creatinine 1.29; sodium was 130; potassium was 4.6. The chest x-ray showed no acute changes grossly. The patient did have a lactic acid level drawn on admission that was 3.2. EKG showed tachycardia initially on admission.   IMPRESSION: 1.  Acute on chronic respiratory failure.  2.  Cryptogenic organizing pneumonia.  3.  Steroid dependence.   PLAN: She is currently on steroids, which will be continued. Unfortunately, she has had a relapsing and remitting course with her cryptogenic organizing pneumonia. She will need a bump in her steroids. Also, she does follow with the [facility] pulmonary group in [place] and she is requesting to be transferred over there, which has been arranged apparently, so will defer to them. She will be continued on steroids, continued on inhalers, continued on Levaquin right now. Would consider doing a followup CT scan if she ends up not being transferred. Further recommendations as necessary.   ____________________________ Yevonne Pax, MD sak:jm D:  12/28/2012 16:06:12 ET T: 12/28/2012 16:59:32 ET JOB#: [id number]  cc: Yevonne Pax, MD, <Dictator> Yevonne Pax MD ELECTRONICALLY SIGNED 12/29/2012 21:30

## 2014-09-28 NOTE — H&P (Signed)
PATIENT NAME:  Kathleen [name], Kathleen [name] MR#:  [id number] DATE OF BIRTH:  07/05/1945  DATE OF ADMISSION:  12/25/2012  PRIMARY CARE PROVIDER:  Dr. Zada Finders  CARDIOLOGIST:  At [facility]   PULMONOLOGIST: Dr. Randall Hiss, also at [facility]  CHIEF COMPLAINT: Shortness of breath, chest pressure.   HISTORY OF PRESENT ILLNESS: The patient is a 69 year old white female with history of diagnosis of BOOP and asthma, who reports that she has been followed at [facility] for her BOOP, and they have attempted to taper off her prednisone, which she has been on chronically for a while. She has gone down to 30 mg this week, and over the past few days she has had progressive worsening shortness of breath. She has been wheezing more and has had a dry cough. She has had chills, but no fevers. She was on chronic oxygen, but was told to take the oxygen off a few months ago, and then was told to start wearing oxygen at nighttime and with activity. The patient reports that over the past few days her shortness of breath has been worse. She has checked her saturation, and that has also dropped. She reports today she started having pressure on the left side of her chest, and came to the ED here. When she arrived in the ED, her heart rate was high. She has a history of atrial fibrillation/atrial flutter, paroxysmal, and she was noted to have a heart rate of 149 and received a dose of IV Cardizem, and her heart rate is now controlled in the 80s. Her chest pressure since resolved. She otherwise denies any hemoptysis, denies any abdominal pain, nausea, vomiting or diarrhea. She does complain of having chronic blurred vision and having thinning of her hair, which she contributes to the prednisone. She also complains of on and off heart beating fast and skipping beats.   PAST MEDICAL HISTORY IS SIGNIFICANT FOR:   1.  Paroxysmal AFib.  2.  History of asthma/BOOP.  3.  Hypertension.  4.  Hyperlipidemia.  5.  Hypothyroidism.  ALLERGIES:  CLINDAMYCIN,  DEMEROL, PENICILLIN, TETRACYCLINE.   SOCIAL HISTORY: Does not smoke. Does not drink. No illicit drug use. Lives at home by herself.   FAMILY HISTORY:  Mother had pancreatic and breast cancer. Father had emphysema.   CURRENT MEDICATIONS: She is on benzonatate 100, 1 tab p.o. t.i.d. as needed for cough, diltiazem 180 daily, dronedarone 400 mg 1 tab p.o. b.i.d., fluticasone 2 sprays both nostrils b.i.d., Lasix 40 b.i.d., lovastatin 40 daily, omega-3 fatty acid 1 tab p.o. daily, omeprazole 20, 1 tab p.o. b.i.d., prednisone taper, she is doing  30 mg currently, Senokot 2 tabs 2 times a day, Singulair 10 daily, Spiriva 18 mcg daily, spironolactone 100 daily, Bactrim 1 tab Monday, Wednesday, Friday, Synthroid 75 mcg daily, Tylenol 500, 2 tabs q. 6, Ventolin 90 mcg 2 puffs q. 6 p.r.n., Xarelto 20 daily.   REVIEW OF SYSTEMS:   CONSTITUTIONAL:  No fevers. Complains of fatigue, weakness. No pain. No weight loss or weight gain.  EYES:  Complains of blurred vision. No pain. No redness. No inflammation. No glaucoma. No cataracts.  EARS, NOSE, THROAT: No tinnitus. No ear pain. No hearing loss. No seasonal allergies. No epistaxis. No nasal discharge. No snoring. No postnasal drip. No redness of the oropharynx. No difficulty swallowing.  RESPIRATORY: Complains of dry cough. Some wheezing. No hemoptysis. Complains of dyspnea. Has chronic interstitial lung disease with BOOP. Has asthma.  CARDIOVASCULAR: Denies any orthopnea. Complains of chest pressure. Has history  of having lower extremity edema. Has had normal echocardiograms in the past. Has chronic intermittent paroxysmal AFib, atrial flutter. Denies any syncope.  GASTROINTESTINAL: Complains of nausea yesterday. No vomiting. No diarrhea. No abdominal pain. No hematemesis. No melena. No ulcer. No GERD.  GENITOURINARY:  Denies any dysuria, hematuria, renal calculus or frequency.  ENDOCRINE: Denies any polyuria, nocturia.  HEMATOLOGIC/LYMPHATIC: Denies any  easy  brusing and bleeding SKIN:  No acne. No rash. No changes in mole, hair or skin.  MUSCULOSKELETAL: Denies any pain in neck, back or shoulder.  NEUROLOGIC:  No numbness. No CVA. No TIA. No seizures.  PSYCHIATRIC:  Denies any anxiety, insomnia, or ADD.   PHYSICAL EXAMINATION: VITAL SIGNS: Temperature 97.9, pulse 95, respirations 24, blood pressure 120/58.  GENERAL:  The patient is an obese Caucasian female, currently not in any acute distress.  HEENT: Head atraumatic, normocephalic. Pupils equally round, reactive to light and accommodation. There is no conjunctival pallor. No scleral icterus. Nasal exam shows no drainage or ulceration. Oropharynx is clear, without any exudate.  NECK:  No thyromegaly. No carotid bruits.  CARDIOVASCULAR: Irregularly irregular rhythm. No murmurs, rubs, clicks or gallops.  LUNGS:  She has occasional wheezing with diminished breath sounds, without any crackles, rhonchi. No accessory muscle usage.  ABDOMEN:  Soft, nontender, nondistended. Positive bowel sounds x 4.  EXTREMITIES:  She has 1+ edema.  SKIN:  No rash.  LYMPHATICS:  No lymph nodes palpable.  VASCULAR:  Good DP, PT pulses.  PSYCHIATRIC:  Not anxious or depressed.  NEUROLOGIC:  Awake, alert, oriented x 3. No focal deficits.   EVALUATIONS IN THE ED:  Glucose 188, BUN 35, creatinine 1.27, sodium 133, potassium 4.1, chloride 97, CO2 is 26, calcium 10. Troponin less than 0.02. WBC 22.7, hemoglobin 16.8, platelet count 280. INR 1.1. Urinalysis is negative. Lactic acid level 3.2. Chest x-ray shows no acute cardiopulmonary processes.   ASSESSMENT AND PLAN: The patient is a 69 year old white female with history of BOOP,  paroxysmal atrial fibrillation/atrial flutter, presents with worsening shortness of breath.   1.  Acute on chronic respiratory failure, likely due to flare of her interstitial lung disease. She has responded well to steroids in the past. At this time, will treat her with IV Solu-Medrol, nebulizers  with Xopenex, since she had an elevated heart rate, and IV antibiotics empirically. Will follow her respiratory status. If it does not improve, consider Pulmonary evaluation.   2. Paroxysmal atrial fibrillation/atrial flutter. She is chronically on Multaq, which we will continue. Elevated heart rate on presentation, now improved. Will resume Cardizem CD and continue Xarelto.    3.  History of lower extremity swelling. No history of congestive heart failure. Will continue Lasix, as taking at home. I will hold spironolactone for the time being. Leukocytosis, possibly due to steroids. Possible bronchitis. She will be treated with IV Levaquin.    4.   Hyperlipidemia. Continue lovastatin.   5.  Hyperglycemia, likely due to steroids. Will check a hemoglobin A1c.   6. Miscellaneous. The patient is already on Xarelto for deep vein thrombosis prophylaxis.  NOTE:  50 minutes spent on this H and P.      ____________________________ Lacie Scotts. Allena Katz, MD shp:mr D: 12/25/2012 17:36:51 ET T: 12/25/2012 18:48:39 ET JOB#: [id number]  cc: Shadd Dunstan H. Allena Katz, MD, <Dictator> Charise Carwin MD ELECTRONICALLY SIGNED 01/09/2013 8:58

## 2014-10-08 ENCOUNTER — Encounter: Payer: Medicare Other | Attending: Family Medicine

## 2014-10-08 DIAGNOSIS — I509 Heart failure, unspecified: Secondary | ICD-10-CM | POA: Insufficient documentation

## 2014-10-08 DIAGNOSIS — J449 Chronic obstructive pulmonary disease, unspecified: Secondary | ICD-10-CM | POA: Insufficient documentation

## 2014-10-15 ENCOUNTER — Other Ambulatory Visit: Payer: Self-pay

## 2014-10-15 DIAGNOSIS — Z1231 Encounter for screening mammogram for malignant neoplasm of breast: Secondary | ICD-10-CM

## 2014-10-22 ENCOUNTER — Telehealth: Payer: Self-pay

## 2014-10-22 NOTE — Telephone Encounter (Signed)
No answer. Couldn't leave message. °

## 2014-10-24 DIAGNOSIS — I509 Heart failure, unspecified: Secondary | ICD-10-CM | POA: Diagnosis present

## 2014-10-24 DIAGNOSIS — J449 Chronic obstructive pulmonary disease, unspecified: Secondary | ICD-10-CM | POA: Diagnosis not present

## 2014-10-24 NOTE — Progress Notes (Signed)
Daily Session Note  Patient Details  Name: Kathleen [name] MRN: [id number] Date of Birth: February 11, 1946 Referring Provider:  Valera Castle, *  Encounter Date: 10/24/2014  Check In:     Session Check In - 10/24/14 1227    Check-In   Staff Present Lestine Box BS, ACSM EP-C, Exercise Physiologist;Carroll Enterkin RN, BSN;Laureen Brown BS, RRT, Respiratory Therapist   ER physicians immediately available to respond to emergencies [facility] immediately available ER MD   Physician(s) gayle and williams   Medication changes reported     No   Fall or balance concerns reported    No   Warm-up and Cool-down Performed on first and last piece of equipment   VAD Patient? No   Pain Assessment   Currently in Pain? No/denies         Goals Met:  Proper associated with RPD/PD & O2 Sat Exercise tolerated well No report of cardiac concerns or symptoms Strength training completed today  Goals Unmet:  Not Applicable  Goals Comments:    Dr. Emily Filbert is Medical Director for [facility] and [facility].

## 2014-10-24 NOTE — Progress Notes (Signed)
Daily Session Note  Patient Details  Name: Kathleen [name] MRN: [id number] Date of Birth: 03/23/1946 Referring Provider:  Valera Castle, *  Encounter Date: 10/24/2014  Check In:     Session Check In - 10/24/14 1227    Check-In   Staff Present Lestine Box BS, ACSM EP-C, Exercise Physiologist;Carroll Enterkin RN, BSN;Laureen Brown BS, RRT, Respiratory Therapist   ER physicians immediately available to respond to emergencies [facility] immediately available ER MD   Physician(s) gayle and williams   Medication changes reported     No   Fall or balance concerns reported    No   Warm-up and Cool-down Performed on first and last piece of equipment   VAD Patient? No   Pain Assessment   Currently in Pain? No/denies         Goals Met:  Proper associated with RPD/PD & O2 Sat Independence with exercise equipment Exercise tolerated well Strength training completed today  Goals Unmet:  Not Applicable  Goals Comments: [name] returned to exercise today after recovery from shingles. She modified her exercise goals. She continues to have a dry cough and has an appointment with her pulmonologist tomorrow.   Dr. Emily Filbert is Medical Director for [facility] and [facility].

## 2014-10-26 ENCOUNTER — Encounter: Payer: Medicare Other | Admitting: Respiratory Therapy

## 2014-10-26 DIAGNOSIS — J449 Chronic obstructive pulmonary disease, unspecified: Secondary | ICD-10-CM

## 2014-10-26 NOTE — Progress Notes (Signed)
Daily Session Note  Patient Details  Name: Kathleen [name] MRN: [id number] Date of Birth: 1946/02/21 Referring Provider:  Valera Castle, *  Encounter Date: 10/26/2014  Check In:     Session Check In - 10/26/14 1242    Check-In   Staff Present Gerlene Burdock RN, BSN;Terriona Horlacher Blanch Media RRT, RCP Respiratory Therapist;Renee Dillard Essex MS, ACSM CEP Exercise Physiologist   ER physicians immediately available to respond to emergencies [facility] immediately available ER MD   Physician(s) Dr. Reita Cliche and Dr. Corky Downs   Medication changes reported     No   Fall or balance concerns reported    No   Warm-up and Cool-down Performed on first and last piece of equipment   VAD Patient? No   Pain Assessment   Currently in Pain? No/denies         Goals Met:  Proper associated with RPD/PD & O2 Sat Independence with exercise equipment Using PLB without cueing & demonstrates good technique Exercise tolerated well  Goals Unmet:  Not Applicable  Goals Comments:    Dr. Emily Filbert is Medical Director for [place] and [facility].

## 2014-10-29 ENCOUNTER — Encounter: Payer: Medicare Other | Admitting: *Deleted

## 2014-10-29 DIAGNOSIS — I509 Heart failure, unspecified: Secondary | ICD-10-CM

## 2014-10-29 DIAGNOSIS — J449 Chronic obstructive pulmonary disease, unspecified: Secondary | ICD-10-CM

## 2014-10-29 NOTE — Progress Notes (Signed)
Daily Session Note  Patient Details  Name: Kathleen [name] MRN: [id number] Date of Birth: January 03, 1946 Referring Provider:  Valera Castle, *  Encounter Date: 10/29/2014  Check In:     Session Check In - 10/29/14 1247    Check-In   Staff Present Heath Lark RN, BSN, CCRP;Laureen Owens Shark BS, RRT, Respiratory Therapist;Carroll Enterkin RN, Lavena Bullion MPH, CHES   ER physicians immediately available to respond to emergencies [facility] immediately available ER MD   Physician(s) Dr. Corky Downs Dr. Reita Cliche   Medication changes reported     No   Fall or balance concerns reported    No   Warm-up and Cool-down Performed on first and last piece of equipment   VAD Patient? No   Pain Assessment   Currently in Pain? Other (Comment)  No change in chronic pain   Multiple Pain Sites No           Exercise Prescription Changes - 10/29/14 1300    Exercise Review   Progression Yes   Response to Exercise   Blood Pressure (Admit) 130/70 mmHg   Blood Pressure (Exercise) 138/80 mmHg   Blood Pressure (Exit) 122/74 mmHg   Heart Rate (Admit) 100 bpm   Heart Rate (Exercise) 97 bpm   Heart Rate (Exit) 86 bpm   Oxygen Saturation (Admit) 93 %   Oxygen Saturation (Exercise) 90 %   Oxygen Saturation (Exit) 96 %   Rating of Perceived Exertion (Exercise) 13   Perceived Dyspnea (Exercise) 4   Intensity THRR unchanged   Progression Continue progressive overload as per policy without signs/symptoms or physical distress.   Resistance Training   Training Prescription Yes   Weight 3   Reps 10-12   Treadmill   MPH 1.7   Grade 2   Minutes 10   NuStep   Level 2   Watts 50   Minutes 15   REL-XR   Level 4   Watts 55   Minutes 15      Goals Met:  Proper associated with RPD/PD & O2 Sat Exercise tolerated well Strength training completed today  Goals Unmet:  Not Applicable  Goals Comments:     Dr. Emily Filbert is Medical Director for [place] and [facility].

## 2014-10-29 NOTE — Progress Notes (Signed)
Daily Session Note  Patient Details  Name: Kathleen [name] MRN: [id number] Date of Birth: 11-03-1945 Referring Provider:  Valera Castle, *  Encounter Date: 10/29/2014  Check In:     Session Check In - 10/29/14 1247    Check-In   Staff Present Heath Lark RN, BSN, CCRP;Laureen Owens Shark BS, RRT, Respiratory Therapist;Jequan Shahin RN, Lavena Bullion MPH, CHES   ER physicians immediately available to respond to emergencies [facility] immediately available ER MD   Physician(s) Dr. Corky Downs Dr. Reita Cliche   Medication changes reported     No   Fall or balance concerns reported    No   Warm-up and Cool-down Performed on first and last piece of equipment   VAD Patient? No   Pain Assessment   Currently in Pain? Other (Comment)  No change in chronic pain   Multiple Pain Sites No           Exercise Prescription Changes - 10/29/14 1000    Exercise Review   Progression Yes   Response to Exercise   Blood Pressure (Admit) 130/70 mmHg   Blood Pressure (Exercise) 138/80 mmHg   Blood Pressure (Exit) 122/74 mmHg   Intensity THRR unchanged   Progression Continue progressive overload as per policy without signs/symptoms or physical distress.   Resistance Training   Training Prescription Yes   Weight 3   Reps 10-12   Treadmill   MPH 1.7   Grade 2   Minutes 10   NuStep   Level 2   Watts 50   Minutes 15   REL-XR   Level 4   Watts 55   Minutes 15      Goals Met:  Proper associated with RPD/PD & O2 Sat Exercise tolerated well  Goals Unmet:  Not Applicable  Goals Comments:   Dr. Emily Filbert is Medical Director for [place] and [facility].

## 2014-10-29 NOTE — Progress Notes (Signed)
Daily Session Note  Patient Details  Name: Kathleen [name] MRN: [id number] Date of Birth: 1946-05-25 Referring Provider:  Valera Castle, *  Encounter Date: 10/29/2014  Check In:     Session Check In - 10/29/14 1247    Check-In   Staff Present Heath Lark RN, BSN, CCRP;Laureen Owens Shark BS, RRT, Respiratory Therapist;Carroll Enterkin RN, Lavena Bullion MPH, CHES   ER physicians immediately available to respond to emergencies [facility] immediately available ER MD   Physician(s) Dr. Corky Downs Dr. Reita Cliche   Medication changes reported     No   Fall or balance concerns reported    No   Warm-up and Cool-down Performed on first and last piece of equipment   VAD Patient? No   Pain Assessment   Currently in Pain? Other (Comment)  No change in chronic pain   Multiple Pain Sites No           Exercise Prescription Changes - 10/29/14 1300    Exercise Review   Progression Yes   Response to Exercise   Blood Pressure (Admit) 130/70 mmHg   Blood Pressure (Exercise) 138/80 mmHg   Blood Pressure (Exit) 122/74 mmHg   Heart Rate (Admit) 100 bpm   Heart Rate (Exercise) 97 bpm   Heart Rate (Exit) 86 bpm   Oxygen Saturation (Admit) 93 %   Oxygen Saturation (Exercise) 90 %   Oxygen Saturation (Exit) 96 %   Rating of Perceived Exertion (Exercise) 13   Perceived Dyspnea (Exercise) 4   Intensity THRR unchanged   Progression Continue progressive overload as per policy without signs/symptoms or physical distress.   Resistance Training   Training Prescription Yes   Weight 3   Reps 10-12   Treadmill   MPH 1.7   Grade 2   Minutes 10   NuStep   Level 2   Watts 50   Minutes 15   REL-XR   Level 4   Watts 55   Minutes 15      Goals Met:  Proper associated with RPD/PD & O2 Sat Independence with exercise equipment Exercise tolerated well Strength training completed today  Goals Unmet:  Not Applicable  Goals Comments: Worked with Ms Creekmore on the EL, since she has one at home.  It  was very difficult for her. We will continue the EL as an exercise goal. Ms Valiente also has [employer] and may consider continuing her exercise at a local gym.   Dr. Emily Filbert is Medical Director for [place] and [facility].

## 2014-10-29 NOTE — Progress Notes (Signed)
Pulmonary Individual Treatment Plan  Patient Details  Name: Kathleen [name] MRN: [id number] Date of Birth: 19-Nov-1945 Referring Provider:  Dione Housekeeper, *  Initial Encounter Date:    Visit Diagnosis: Mild COPD, CHF Patient's Home Medications on Admission: No current outpatient prescriptions on file.  Past Medical History: No past medical history on file.  Tobacco Use: History  Smoking status  . Not on file  Smokeless tobacco  . Not on file    Labs: Recent Review Flowsheet Data    There is no flowsheet data to display.       ADL UCSD:     ADL UCSD      05/22/14 0915       ADL UCSD   ADL Phase Entry     SOB Score total 44     Rest 0     Walk 0     Stairs 1     Bath 3     Shop 1         Pulmonary Function Assessment:   Exercise Target Goals:    Exercise Program Goal: Individual exercise prescription set with THRR, safety & activity barriers. Participant demonstrates ability to understand and report RPE using BORG scale, to self-measure pulse accurately, and to acknowledge the importance of the exercise prescription.  Exercise Prescription Goal: Starting with aerobic activity 30 plus minutes a day, 3 days per week for initial exercise prescription. Provide home exercise prescription and guidelines that participant acknowledges understanding prior to discharge.  Activity Barriers & Risk Stratification:   6 Minute Walk:     6 Minute Walk      05/22/14 0915 08/01/14 1213     6 Minute Walk   Phase Initial Mid Program    Distance 900 feet 1040 feet    Walk Time 6 minutes 6 minutes    Resting HR 83 bpm 100 bpm    Resting BP 122/72 mmHg 120/70 mmHg    Max Ex. HR 85 bpm 106 bpm    Max Ex. BP 130/82 mmHg 144/74 mmHg    RPE 14 13    Perceived Dyspnea  5 3       Initial Exercise Prescription:   Exercise Prescription Changes:     Exercise Prescription Changes      10/29/14 1000 10/29/14 1300         Exercise Review   Progression  Yes Yes      Response to Exercise   Blood Pressure (Admit) 130/70 mmHg 130/70 mmHg      Blood Pressure (Exercise) 138/80 mmHg 138/80 mmHg      Blood Pressure (Exit) 122/74 mmHg 122/74 mmHg      Heart Rate (Admit)  100 bpm      Heart Rate (Exercise)  97 bpm      Heart Rate (Exit)  86 bpm      Oxygen Saturation (Admit)  93 %      Oxygen Saturation (Exercise)  90 %      Oxygen Saturation (Exit)  96 %      Rating of Perceived Exertion (Exercise)  13      Perceived Dyspnea (Exercise)  4      Intensity THRR unchanged THRR unchanged      Progression Continue progressive overload as per policy without signs/symptoms or physical distress. Continue progressive overload as per policy without signs/symptoms or physical distress.      Resistance Training   Training Prescription Yes Yes      Weight 3  3      Reps 10-12 10-12      Treadmill   MPH 1.7 1.7      Grade 2 2      Minutes 10 10      NuStep   Level 2 2      Watts 50 50      Minutes 15 15      REL-XR   Level 4 4      Watts 55 55      Minutes 15 15         Discharge Exercise Prescription:    Nutrition:  Target Goals: Understanding of nutrition guidelines, daily intake of sodium 1500mg , cholesterol 200mg , calories 30% from fat and 7% or less from saturated fats, daily to have 5 or more servings of fruits and vegetables.  Biometrics:    Nutrition Therapy Plan and Nutrition Goals:   Nutrition Discharge: Rate Your Plate Scores:   Psychosocial: Target Goals: Acknowledge presence or absence of depression, maximize coping skills, provide positive support system. Participant is able to verbalize types and ability to use techniques and skills needed for reducing stress and depression.  Initial Review & Psychosocial Screening:   Quality of Life Scores:   PHQ-9:     Recent Review Flowsheet Data    There is no flowsheet data to display.      Psychosocial Evaluation and Intervention:   Psychosocial  Re-Evaluation:  Education: Education Goals: Education classes will be provided on a weekly basis, covering required topics. Participant will state understanding/return demonstration of topics presented.  Learning Barriers/Preferences:   Education Topics: Initial Evaluation Education: - Verbal, written and demonstration of respiratory meds, RPE/PD scales, oximetry and breathing techniques. Instruction on use of nebulizers and MDIs: cleaning and proper use, rinsing mouth with steroid doses and importance of monitoring MDI activations.   General Nutrition Guidelines/Fats and Fiber: -Group instruction provided by verbal, written material, models and posters to present the general guidelines for heart healthy nutrition. Gives an explanation and review of dietary fats and fiber.   Controlling Sodium/Reading Food Labels: -Group verbal and written material supporting the discussion of sodium use in heart healthy nutrition. Review and explanation with models, verbal and written materials for utilization of the food label.   Exercise Physiology & Risk Factors: - Group verbal and written instruction with models to review the exercise physiology of the cardiovascular system and associated critical values. Details cardiovascular disease risk factors and the goals associated with each risk factor.   Aerobic Exercise & Resistance Training: - Gives group verbal and written discussion on the health impact of inactivity. On the components of aerobic and resistive training programs and the benefits of this training and how to safely progress through these programs.   Flexibility, Balance, General Exercise Guidelines: - Provides group verbal and written instruction on the benefits of flexibility and balance training programs. Provides general exercise guidelines with specific guidelines to those with heart or lung disease. Demonstration and skill practice provided.   Stress Management: - Provides group  verbal and written instruction about the health risks of elevated stress, cause of high stress, and healthy ways to reduce stress.   Depression: - Provides group verbal and written instruction on the correlation between heart/lung disease and depressed mood, treatment options, and the stigmas associated with seeking treatment.   Exercise & Equipment Safety: - Individual verbal instruction and demonstration of equipment use and safety with use of the equipment.   Infection Prevention: - Provides verbal and written  material to individual with discussion of infection control including proper hand washing and proper equipment cleaning during exercise session.   Falls Prevention: - Provides verbal and written material to individual with discussion of falls prevention and safety.   Diabetes: - Individual verbal and written instruction to review signs/symptoms of diabetes, desired ranges of glucose level fasting, after meals and with exercise. Advice that pre and post exercise glucose checks will be done for 3 sessions at entry of program.   Chronic Lung Diseases: - Group verbal and written instruction to review new updates, new respiratory medications, new advancements in procedures and treatments. Provide informative websites and "800" numbers of self-education.   Lung Procedures: - Group verbal and written instruction to describe testing methods done to diagnose lung disease. Review the outcome of test results. Describe the treatment choices: Pulmonary Function Tests, ABGs and oximetry.   Energy Conservation: - Provide group verbal and written instruction for methods to conserve energy, plan and organize activities. Instruct on pacing techniques, use of adaptive equipment and posture/positioning to relieve shortness of breath.   Triggers: - Group verbal and written instruction to review types of environmental controls: home humidity, furnaces, filters, dust mite/pet prevention, HEPA  vacuums. To discuss weather changes, air quality and the benefits of nasal washing.   Exacerbations: - Group verbal and written instruction to provide: warning signs, infection symptoms, calling MD promptly, preventive modes, and value of vaccinations. Review: effective airway clearance, coughing and/or vibration techniques. Create an Sport and exercise psychologist.   Oxygen: - Individual and group verbal and written instruction on oxygen therapy. Includes supplement oxygen, available portable oxygen systems, continuous and intermittent flow rates, oxygen safety, concentrators, and Medicare reimbursement for oxygen.   Respiratory Medications: - Group verbal and written instruction to review medications for lung disease. Drug class, frequency, complications, importance of spacers, rinsing mouth after steroid MDI's, and proper cleaning methods for nebulizers.   AED/CPR: - Group verbal and written instruction with the use of models to demonstrate the basic use of the AED with the basic ABC's of resuscitation.   Breathing Retraining: - Provides individuals verbal and written instruction on purpose, frequency, and proper technique of diaphragmatic breathing and pursed-lipped breathing. Applies individual practice skills.   Anatomy and Physiology of the Lungs: - Group verbal and written instruction with the use of models to provide basic lung anatomy and physiology related to function, structure and complications of lung disease.   Heart Failure: - Group verbal and written instruction on the basics of heart failure: signs/symptoms, treatments, explanation of ejection fraction, enlarged heart and cardiomyopathy.   Sleep Apnea: - Individual verbal and written instruction to review Obstructive Sleep Apnea. Review of risk factors, methods for diagnosing and types of masks and machines for OSA.   Anxiety: - Provides group, verbal and written instruction on the correlation between heart/lung disease and  anxiety, treatment options, and management of anxiety.   Relaxation: - Provides group, verbal and written instruction about the benefits of relaxation for patients with heart/lung disease. Also provides patients with examples of relaxation techniques.   Knowledge Questionnaire Score:   Personal Goals and Risk Factors at Admission:     Personal Goals and Risk Factors at Admission - 05/22/14 0915    Personal Goals and Risk Factors on Admission    Weight Management Yes   Intervention Learn and follow the exercise and diet guidelines while in the program. Utilize the nutrition and education classes to help gain knowledge of the diet and exercise expectations in the  program   Admit Weight 214 lb 8 oz (97.297 kg)   Goal Weight 130 lb (58.968 kg)   Increase Aerobic Exercise and Physical Activity Yes   Intervention While in program, learn and follow the exercise prescription taught. Start at a low level workload and increase workload after able to maintain previous level for 30 minutes. Increase time before increasing intensity.   Understand more about Heart/Pulmonary Disease. Yes   Intervention While in program utilize professionals for any questions, and attend the education sessions. Great websites to use are www.americanheart.org or www.lung.org for reliable information.   Improve shortness of breath with ADL's Yes   Intervention While in program, learn and follow the exercise prescription taught. Start at a low level workload and increase workload ad advised by the exercise physiologist. Increase time before increasing intensity.   Develop more efficient breathing techniques such as purse lipped breathing and diaphragmatic breathing; and practicing self-pacing with activity Yes   Intervention While in program, learn and utilize the specific breathing techniques taught to you. Continue to practice and use the techniques as needed.   Increase knowledge of respiratory medications and ability to  use respiratory devices properly.  Yes   Intervention While in program learn and demonstrate appropriate use of your oxygen therapy by increasing flow with exertion, manage oxygen tank operation, including continuous and intermittent flow.  Understanding oxygen is a drug ordered by your physician.   Hypertension Yes   Lipids Yes      Personal Goals and Risk Factors Review:    Personal Goals Discharge:    Comments: 30 day review

## 2014-11-01 ENCOUNTER — Ambulatory Visit
Admission: RE | Admit: 2014-11-01 | Discharge: 2014-11-01 | Disposition: A | Discharge status: Home / Self Care | Payer: Medicare Other | Source: Ambulatory Visit | Attending: Family Medicine | Admitting: Family Medicine

## 2014-11-01 ENCOUNTER — Other Ambulatory Visit: Payer: Self-pay

## 2014-11-01 DIAGNOSIS — R922 Inconclusive mammogram: Secondary | ICD-10-CM | POA: Insufficient documentation

## 2014-11-01 DIAGNOSIS — Z1231 Encounter for screening mammogram for malignant neoplasm of breast: Secondary | ICD-10-CM

## 2014-11-02 ENCOUNTER — Encounter: Payer: Medicare Other | Admitting: Respiratory Therapy

## 2014-11-02 ENCOUNTER — Other Ambulatory Visit: Payer: Self-pay | Admitting: Family Medicine

## 2014-11-02 DIAGNOSIS — R928 Other abnormal and inconclusive findings on diagnostic imaging of breast: Secondary | ICD-10-CM

## 2014-11-02 DIAGNOSIS — I509 Heart failure, unspecified: Secondary | ICD-10-CM

## 2014-11-02 DIAGNOSIS — J449 Chronic obstructive pulmonary disease, unspecified: Secondary | ICD-10-CM | POA: Diagnosis not present

## 2014-11-02 NOTE — Progress Notes (Signed)
Daily Session Note  Patient Details  Name: Kathleen [name] MRN: [id number] Date of Birth: 1946-02-01 Referring Provider:  Valera Castle, *  Encounter Date: 11/02/2014  Check In:      Goals Met:  Proper associated with RPD/PD & O2 Sat Independence with exercise equipment Personal goals reviewed  Goals Unmet:  Not Applicable  Goals Comments:    Dr. Emily Filbert is Medical Director for [place] and [facility].

## 2014-11-09 ENCOUNTER — Encounter: Payer: Medicare Other | Attending: Family Medicine | Admitting: *Deleted

## 2014-11-09 DIAGNOSIS — J449 Chronic obstructive pulmonary disease, unspecified: Secondary | ICD-10-CM | POA: Diagnosis present

## 2014-11-09 DIAGNOSIS — I509 Heart failure, unspecified: Secondary | ICD-10-CM | POA: Diagnosis present

## 2014-11-09 NOTE — Progress Notes (Signed)
Daily Session Note  Patient Details  Name: Kathleen [name] MRN: [id number] Date of Birth: 10-20-1945 Referring Provider:  Valera Castle, *  Encounter Date: 11/09/2014  Check In:     Session Check In - 11/09/14 1234    Check-In   Staff Present Candiss Norse MS, ACSM CEP Exercise Physiologist;Carroll Enterkin RN, BSN;Laureen Janell Quiet, RRT, Respiratory Therapist   ER physicians immediately available to respond to emergencies [facility] immediately available ER MD   Physician(s) Kerman Passey and Thomasene Lot   Medication changes reported     No   Fall or balance concerns reported    No   Warm-up and Cool-down Performed on first and last piece of equipment   VAD Patient? No   Pain Assessment   Currently in Pain? No/denies   Multiple Pain Sites No           Exercise Prescription Changes - 11/09/14 1200    Treadmill   MPH 1.8   Grade 2   Minutes 10   NuStep   Level 2   Watts 50   Minutes 15   REL-XR   Level 3   Watts 50   Minutes 15      Goals Met:  Proper associated with RPD/PD & O2 Sat Independence with exercise equipment Using PLB without cueing & demonstrates good technique Exercise tolerated well Personal goals reviewed Strength training completed today  Goals Unmet:  Not Applicable  Goals Comments:    Dr. Emily Filbert is Medical Director for [place] and [facility].

## 2014-11-09 NOTE — Progress Notes (Signed)
Daily Session Note  Patient Details  Name: Kathleen [name] MRN: [id number] Date of Birth: [birth year] Referring Provider:  Valera Castle, *  Encounter Date: 11/09/2014  Check In:     Session Check In - 11/09/14 1234    Check-In   Staff Present Candiss Norse MS, ACSM CEP Exercise Physiologist;Carroll Enterkin RN, BSN;Laureen Janell Quiet, RRT, Respiratory Therapist   ER physicians immediately available to respond to emergencies [facility] immediately available ER MD   Physician(s) Kerman Passey and Thomasene Lot   Medication changes reported     No   Fall or balance concerns reported    No   Warm-up and Cool-down Performed on first and last piece of equipment   VAD Patient? No   Pain Assessment   Currently in Pain? No/denies   Multiple Pain Sites No           Exercise Prescription Changes - 11/09/14 1200    Treadmill   MPH 1.8   Grade 2   Minutes 10   NuStep   Level 2   Watts 50   Minutes 15   REL-XR   Level 3   Watts 50   Minutes 15      Goals Met:  Proper associated with RPD/PD & O2 Sat Independence with exercise equipment Exercise tolerated well Strength training completed today  Goals Unmet:  Not Applicable  Goals Comments: [name] is on an antibiotic for 2 months; her cardiologist performed blood work, echo, and chest xray to check on her cardiac status.   Dr. Emily Filbert is Medical Director for [place] and [facility].

## 2014-11-12 NOTE — Patient Instructions (Signed)
pul Discharge Instructions  Patient Details  Name: Kathleen [name] MRN: [id number] Date of Birth: 10/17/1945 Referring Provider:  Dione Housekeeper, *   Number of Visits: 36 Reason for Discharge:  Patient reached a stable level of exercise. Patient independent in their exercise.  Smoking History:  History  Smoking status   Not on file  Smokeless tobacco   Not on file    Diagnosis:  Chronic obstructive pulmonary disease, unspecified COPD  Initial Exercise Prescription:   Discharge Exercise Prescription:   Functional Capacity:     6 Minute Walk      05/22/14 0915 08/01/14 1213 11/09/14 1235   6 Minute Walk   Phase Initial Mid Program Discharge   Distance 900 feet 1040 feet 960 feet   Walk Time 6 minutes 6 minutes 6 minutes   Resting HR 83 bpm 100 bpm 95 bpm   Resting BP 122/72 mmHg 120/70 mmHg 110/80 mmHg   Max Ex. HR 85 bpm 106 bpm 109 bpm   Max Ex. BP 130/82 mmHg 144/74 mmHg 140/72 mmHg   RPE 14 13 12    Perceived Dyspnea  5 3 3       Quality of Life:   Personal Goals: Goals established at orientation with interventions provided to work toward goal.     Personal Goals and Risk Factors at Admission - 05/22/14 0915    Personal Goals and Risk Factors on Admission    Weight Management Yes   Intervention Learn and follow the exercise and diet guidelines while in the program. Utilize the nutrition and education classes to help gain knowledge of the diet and exercise expectations in the program   Admit Weight 214 lb 8 oz (97.297 kg)   Goal Weight 130 lb (58.968 kg)   Increase Aerobic Exercise and Physical Activity Yes   Intervention While in program, learn and follow the exercise prescription taught. Start at a low level workload and increase workload after able to maintain previous level for 30 minutes. Increase time before increasing intensity.   Understand more about Heart/Pulmonary Disease. Yes   Intervention While in program utilize professionals for  any questions, and attend the education sessions. Great websites to use are www.americanheart.org or www.lung.org for reliable information.   Improve shortness of breath with ADL's Yes   Intervention While in program, learn and follow the exercise prescription taught. Start at a low level workload and increase workload ad advised by the exercise physiologist. Increase time before increasing intensity.   Develop more efficient breathing techniques such as purse lipped breathing and diaphragmatic breathing; and practicing self-pacing with activity Yes   Intervention While in program, learn and utilize the specific breathing techniques taught to you. Continue to practice and use the techniques as needed.   Increase knowledge of respiratory medications and ability to use respiratory devices properly.  Yes   Intervention While in program learn and demonstrate appropriate use of your oxygen therapy by increasing flow with exertion, manage oxygen tank operation, including continuous and intermittent flow.  Understanding oxygen is a drug ordered by your physician.   Hypertension Yes   Lipids Yes       Personal Goals Discharge:   Nutrition & Weight - Outcomes:      Post Biometrics - 11/09/14 1236     Post  Biometrics   Waist Circumference 42 inches   Hip Circumference 53 inches   Waist to Hip Ratio 0.79 %      Nutrition:   Nutrition Discharge:   Education Questionnaire  Score:   GD04 Depression Questionnaire Results      Pre 5    Post 8  SF36                                                                   Pre           Post             Physical Fx     14         19                Role Fx: Physical    5   5              Bodily Pain     8   8    General Health    12   8   Vitality     13   9    Social Fx     6   6   Role Fx: Emotional    6   6   Mental Health    24   23    Goals reviewed with patient; copy given to patient.

## 2014-11-14 ENCOUNTER — Encounter: Payer: Medicare Other | Admitting: *Deleted

## 2014-11-14 DIAGNOSIS — J449 Chronic obstructive pulmonary disease, unspecified: Secondary | ICD-10-CM | POA: Diagnosis not present

## 2014-11-14 NOTE — Progress Notes (Signed)
Pulmonary Individual Treatment Plan  Patient Details  Name: Kathleen [name] MRN: [id number] Date of Birth: 02-24-1946 Referring Provider:  Dione Housekeeper, *  Initial Encounter Date:  05/23/2015  Visit Diagnosis: COPD, mild  Patient's Home Medications on Admission: No current outpatient prescriptions on file.  Past Medical History: No past medical history on file.  Tobacco Use: History  Smoking status   Not on file  Smokeless tobacco   Not on file    Labs: Recent Review Flowsheet Data    There is no flowsheet data to display.       ADL [facility]SD:     ADL [facility]SD      05/22/14 0915 11/09/14 1130     ADL [facility]SD   ADL Phase Entry Exit    SOB Score total 44 53    Rest 0 0    Walk 0 2    Stairs 1 3    Bath 3 2    Dress  0    Shop 1 2        Pulmonary Function Assessment:   Exercise Target Goals:    Exercise Program Goal: Individual exercise prescription set with THRR, safety & activity barriers. Participant demonstrates ability to understand and report RPE using BORG scale, to self-measure pulse accurately, and to acknowledge the importance of the exercise prescription.  Exercise Prescription Goal: Starting with aerobic activity 30 plus minutes a day, 3 days per week for initial exercise prescription. Provide home exercise prescription and guidelines that participant acknowledges understanding prior to discharge.  Activity Barriers & Risk Stratification:   6 Minute Walk:     6 Minute Walk      05/22/14 0915 08/01/14 1213 11/09/14 1235   6 Minute Walk   Phase Initial Mid Program Discharge   Distance 900 feet 1040 feet 960 feet   Walk Time 6 minutes 6 minutes 6 minutes   Resting HR 83 bpm 100 bpm 95 bpm   Resting BP 122/72 mmHg 120/70 mmHg 110/80 mmHg   Max Ex. HR 85 bpm 106 bpm 109 bpm   Max Ex. BP 130/82 mmHg 144/74 mmHg 140/72 mmHg   RPE 14 13 12    Perceived Dyspnea  5 3 3       Initial Exercise Prescription:   Exercise Prescription  Changes:     Exercise Prescription Changes      10/29/14 1000 10/29/14 1300 11/09/14 1200       Exercise Review   Progression Yes Yes      Response to Exercise   Blood Pressure (Admit) 130/70 mmHg 130/70 mmHg      Blood Pressure (Exercise) 138/80 mmHg 138/80 mmHg      Blood Pressure (Exit) 122/74 mmHg 122/74 mmHg      Heart Rate (Admit)  100 bpm      Heart Rate (Exercise)  97 bpm      Heart Rate (Exit)  86 bpm      Oxygen Saturation (Admit)  93 %      Oxygen Saturation (Exercise)  90 %      Oxygen Saturation (Exit)  96 %      Rating of Perceived Exertion (Exercise)  13      Perceived Dyspnea (Exercise)  4      Intensity THRR unchanged THRR unchanged      Progression Continue progressive overload as per policy without signs/symptoms or physical distress. Continue progressive overload as per policy without signs/symptoms or physical distress.      Resistance Training  Training Prescription Yes Yes      Weight 3 3      Reps 10-12 10-12      Treadmill   MPH 1.7 1.7 1.8     Grade 2 2 2      Minutes 10 10 10      NuStep   Level 2 2 2      Watts 50 50 50     Minutes 15 15 15      REL-XR   Level 4 4 3      Watts 55 55 50     Minutes 15 15 15         Discharge Exercise Prescription:     Discharge Prescription - 11/14/14 1500    Exercise Review   Progression Yes   Response to Exercise   Blood Pressure (Admit) 122/70 mmHg   Blood Pressure (Exercise) 138/80 mmHg   Blood Pressure (Exit) 118/60 mmHg   Heart Rate (Admit) 97 bpm   Heart Rate (Exercise) 94 bpm   Heart Rate (Exit) 81 bpm   Oxygen Saturation (Admit) 95 %   Oxygen Saturation (Exercise) 92 %   Oxygen Saturation (Exit) 94 %   Rating of Perceived Exertion (Exercise) 13   Perceived Dyspnea (Exercise) 3   Frequency Add 2 additional days to program exercise sessions.   Duration Progress to 50 minutes of aerobic without signs/symptoms of physical distress   Intensity Rest + 30   Progression Continue progressive  overload as per policy without signs/symptoms or physical distress.   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  Biometrics:      Post Biometrics - 11/09/14 1236     Post  Biometrics   Waist Circumference 42 inches   Hip Circumference 53 inches   Waist to Hip Ratio 0.79 %      Nutrition Therapy Plan and Nutrition Goals:   Nutrition Discharge: Rate Your Plate Scores:   Psychosocial: Target Goals: Acknowledge presence or absence of depression, maximize coping skills, provide positive support system. Participant is able to verbalize types and ability to use techniques and skills needed for reducing stress and depression.  Initial Review & Psychosocial Screening:   Quality of Life Scores:  GD04 Depression Questionnaire Results      Pre 5    Post 8   SF36                                                                   Pre           Post  Physical Fx     14                    19                Role Fx: Physical    5   5              Bodily Pain     8   8    General Health    12   8   Vitality                 13   9    Social Fx     6   6   Role Fx: Emotional    6   6   Mental Health    24   23  PHQ-9:     Recent Review Flowsheet Data    There is no flowsheet data to display.      Psychosocial Evaluation and Intervention:   Psychosocial Re-Evaluation:  Education: Education Goals: Education classes will be provided  on a weekly basis, covering required topics. Participant will state understanding/return demonstration of topics presented.  Learning Barriers/Preferences:   Education Topics: Initial Evaluation Education: - Verbal, written and demonstration of respiratory meds, RPE/PD scales, oximetry and breathing techniques. Instruction on use of nebulizers and MDIs: cleaning and proper use, rinsing mouth with steroid doses and importance of monitoring MDI activations.   General Nutrition Guidelines/Fats and Fiber: -Group instruction provided by verbal, written material, models and posters to present the general guidelines for heart healthy nutrition. Gives an explanation and review of dietary fats and fiber.   Controlling Sodium/Reading Food Labels: -Group verbal and written material supporting the discussion of sodium use in heart healthy nutrition. Review and explanation with models, verbal and written materials for utilization of the food label.   Exercise Physiology & Risk Factors: - Group verbal and written instruction with models to review the exercise physiology of the cardiovascular system and associated critical values. Details cardiovascular disease risk factors and the goals associated with each risk factor.   Aerobic Exercise & Resistance Training: - Gives group verbal and written discussion on the health impact of inactivity. On the components of aerobic and resistive training programs and the benefits of this training and how to safely progress through these programs.   Flexibility, Balance, General Exercise Guidelines: - Provides group verbal and written instruction on the benefits of flexibility and balance training programs. Provides general exercise guidelines with specific guidelines to those with heart or lung disease. Demonstration and skill practice provided.   Stress Management: - Provides group verbal and written instruction about the health risks of elevated stress, cause  of high stress, and healthy ways to reduce stress.   Depression: - Provides group verbal and written instruction on the correlation between heart/lung disease and depressed mood, treatment options, and the stigmas associated with seeking treatment.   Exercise & Equipment Safety: - Individual verbal instruction and demonstration of equipment use and safety with use of the equipment.   Infection Prevention: - Provides verbal and written material to individual with discussion of infection control including proper hand washing and proper equipment cleaning during exercise session.   Falls Prevention: - Provides verbal and written material to individual with discussion of falls prevention and safety.   Diabetes: - Individual verbal and written instruction to review signs/symptoms of diabetes, desired ranges of glucose level fasting, after meals and with exercise. Advice  that pre and post exercise glucose checks will be done for 3 sessions at entry of program.   Chronic Lung Diseases: - Group verbal and written instruction to review new updates, new respiratory medications, new advancements in procedures and treatments. Provide informative websites and "800" numbers of self-education.   Lung Procedures: - Group verbal and written instruction to describe testing methods done to diagnose lung disease. Review the outcome of test results. Describe the treatment choices: Pulmonary Function Tests, ABGs and oximetry.   Energy Conservation: - Provide group verbal and written instruction for methods to conserve energy, plan and organize activities. Instruct on pacing techniques, use of adaptive equipment and posture/positioning to relieve shortness of breath.   Triggers: - Group verbal and written instruction to review types of environmental controls: home humidity, furnaces, filters, dust mite/pet prevention, HEPA vacuums. To discuss weather changes, air quality and the benefits of nasal  washing.   Exacerbations: - Group verbal and written instruction to provide: warning signs, infection symptoms, calling MD promptly, preventive modes, and value of vaccinations. Review: effective airway clearance, coughing and/or vibration techniques. Create an Sport and exercise psychologist.   Oxygen: - Individual and group verbal and written instruction on oxygen therapy. Includes supplement oxygen, available portable oxygen systems, continuous and intermittent flow rates, oxygen safety, concentrators, and Medicare reimbursement for oxygen.   Respiratory Medications: - Group verbal and written instruction to review medications for lung disease. Drug class, frequency, complications, importance of spacers, rinsing mouth after steroid MDI's, and proper cleaning methods for nebulizers.   AED/CPR: - Group verbal and written instruction with the use of models to demonstrate the basic use of the AED with the basic ABC's of resuscitation.   Breathing Retraining: - Provides individuals verbal and written instruction on purpose, frequency, and proper technique of diaphragmatic breathing and pursed-lipped breathing. Applies individual practice skills.   Anatomy and Physiology of the Lungs: - Group verbal and written instruction with the use of models to provide basic lung anatomy and physiology related to function, structure and complications of lung disease.   Heart Failure: - Group verbal and written instruction on the basics of heart failure: signs/symptoms, treatments, explanation of ejection fraction, enlarged heart and cardiomyopathy.   Sleep Apnea: - Individual verbal and written instruction to review Obstructive Sleep Apnea. Review of risk factors, methods for diagnosing and types of masks and machines for OSA.   Anxiety: - Provides group, verbal and written instruction on the correlation between heart/lung disease and anxiety, treatment options, and management of anxiety.   Relaxation: -  Provides group, verbal and written instruction about the benefits of relaxation for patients with heart/lung disease. Also provides patients with examples of relaxation techniques.   Knowledge Questionnaire Score:   Personal Goals and Risk Factors at Admission:     Personal Goals and Risk Factors at Admission - 05/22/14 0915    Personal Goals and Risk Factors on Admission    Weight Management Yes   Intervention Learn and follow the exercise and diet guidelines while in the program. Utilize the nutrition and education classes to help gain knowledge of the diet and exercise expectations in the program   Admit Weight 214 lb 8 oz (97.297 kg)   Goal Weight 130 lb (58.968 kg)   Increase Aerobic Exercise and Physical Activity Yes   Intervention While in program, learn and follow the exercise prescription taught. Start at a low level workload and increase workload after able to maintain previous level for 30 minutes. Increase time before increasing intensity.  Understand more about Heart/Pulmonary Disease. Yes   Intervention While in program utilize professionals for any questions, and attend the education sessions. Great websites to use are www.americanheart.org or www.lung.org for reliable information.   Improve shortness of breath with ADL's Yes   Intervention While in program, learn and follow the exercise prescription taught. Start at a low level workload and increase workload ad advised by the exercise physiologist. Increase time before increasing intensity.   Develop more efficient breathing techniques such as purse lipped breathing and diaphragmatic breathing; and practicing self-pacing with activity Yes   Intervention While in program, learn and utilize the specific breathing techniques taught to you. Continue to practice and use the techniques as needed.   Increase knowledge of respiratory medications and ability to use respiratory devices properly.  Yes   Intervention While in program  learn and demonstrate appropriate use of your oxygen therapy by increasing flow with exertion, manage oxygen tank operation, including continuous and intermittent flow.  Understanding oxygen is a drug ordered by your physician.   Hypertension Yes   Lipids Yes      Personal Goals and Risk Factors Review:    Personal Goals Discharge:      Personal Goals at Discharge - 11/14/14 1130    Weight Management   Goals Progress/Improvement seen No   Comments Kathleen Carrero was hoping to lose weight during [facility], but she has maintained her weight or increased due to fluid. She is very careful about salt intake and has a nurse from [facility] who calls her regularly for  updates on her health and for education.   Increase Aerobic Exercise and Physical Activity   Goals Progress/Improvement seen  Yes   Comments Kathleen Dlugosz has improved her by 85ft, has increased her exercise goals with the aerobic equipment in [facility], and states daily walking has improved.   Understand more about Heart/Pulmonary Disease   Goals Progress/Improvement seen  Yes   Comments Kathleen Grosch has attended the education classes and has learned information for self-management of her COPD and CHF.   Improve shortness of breath with ADL's   Goals Progress/Improvement seen  Yes   Comments Kathleen Shreeve uses PLB and pacing to manage her shortness of breath.   Breathing Techniques   Goals Progress/Improvement seen  Yes   Comments Uses with activity   Increase knowledge of respiratory medications   Comments Uses her Albuterol, Flovent, and Spiriva properly; although she is concerned with the cost of the Spiriva.      Comments: Thank you for the opportunity to work with your patient, Kathleen Merida Alcantar.

## 2014-11-14 NOTE — Progress Notes (Signed)
Daily Session Note  Patient Details  Name: SEARRA CARNATHAN MRN: [id number] Date of Birth: April 30, 1946 Referring Provider:  Valera Castle, *  Encounter Date: 11/14/2014  Check In:     Session Check In - 11/14/14 1207    Check-In   Staff Present Candiss Norse MS, ACSM CEP Exercise Physiologist;Laureen Janell Quiet, RRT, Respiratory Therapist;Steven Way BS, ACSM EP-C, Exercise Physiologist   ER physicians immediately available to respond to emergencies [facility] immediately available ER MD   Physician(s) Claudette Head and Archie Balboa   Medication changes reported     No   Fall or balance concerns reported    No   Warm-up and Cool-down Performed on first and last piece of equipment   VAD Patient? No   Pain Assessment   Currently in Pain? No/denies   Multiple Pain Sites No         Goals Met:  Proper associated with RPD/PD & O2 Sat Independence with exercise equipment Using PLB without cueing & demonstrates good technique Exercise tolerated well Personal goals reviewed Strength training completed today  Goals Unmet:  Not Applicable  Goals Comments:    Dr. Emily Filbert is Medical Director for [facility] and [facility].

## 2014-11-14 NOTE — Progress Notes (Signed)
Discharge Summary  Patient Details  Name: Kathleen [name] MRN: [id number] Date of Birth: 11/29/1945 Referring Provider:  Dione Housekeeper, *   Number of Visits: 36  Reason for Discharge:  Patient reached a stable level of exercise. Patient independent in their exercise.  Smoking History:  History  Smoking status   Not on file  Smokeless tobacco   Not on file    Diagnosis:  COPD, mild  ADL UCSD:     ADL UCSD      05/22/14 0915 11/09/14 1130     ADL UCSD   ADL Phase Entry Exit    SOB Score total 44 53    Rest 0 0    Walk 0 2    Stairs 1 3    Bath 3 2    Dress  0    Shop 1 2       Initial Exercise Prescription:   Discharge Exercise Prescription:     Discharge Prescription - 11/14/14 1500    Exercise Review   Progression Yes   Response to Exercise   Blood Pressure (Admit) 122/70 mmHg   Blood Pressure (Exercise) 138/80 mmHg   Blood Pressure (Exit) 118/60 mmHg   Heart Rate (Admit) 97 bpm   Heart Rate (Exercise) 94 bpm   Heart Rate (Exit) 81 bpm   Oxygen Saturation (Admit) 95 %   Oxygen Saturation (Exercise) 92 %   Oxygen Saturation (Exit) 94 %   Rating of Perceived Exertion (Exercise) 13   Perceived Dyspnea (Exercise) 3   Frequency Add 2 additional days to program exercise sessions.   Duration Progress to 50 minutes of aerobic without signs/symptoms of physical distress   Intensity Rest + 30   Progression Continue progressive overload as per policy without signs/symptoms or physical distress.   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 HR 85 bpm 106 bpm 109 bpm   Max Ex. BP 130/82 mmHg 144/74 mmHg 140/72 mmHg   RPE Perceived Dyspnea  Psychological, QOL, Others - Outcomes: PHQ 2/9: No flowsheet data found.  Quality of Life:  GD04 Depression Questionnaire Results      Pre 5    Post 8   SF36  Pre           Post             Physical Fx     14                    19                Role Fx: Physical    5   5              Bodily Pain     8   8    General Health    12   8   Vitality                 13   9    Social Fx     6   6   Role Fx: Emotional    6   6   Mental Health    24   23   Personal Goals: Goals established at orientation with interventions provided to work toward goal.     Personal Goals and Risk Factors at Admission - 05/22/14 0915    Personal Goals and Risk Factors on Admission    Weight Management Yes   Intervention Learn and follow the exercise and diet guidelines while in the program. Utilize the nutrition and education classes to help gain knowledge of the diet and exercise expectations in the program   Admit Weight 214 lb 8 oz (97.297 kg)   Goal Weight 130 lb (58.968 kg)   Increase Aerobic Exercise and Physical Activity Yes   Intervention While in program, learn and follow the exercise prescription taught. Start at a low level workload and increase workload after able to maintain previous level for 30 minutes. Increase time before increasing intensity.   Understand more about Heart/Pulmonary Disease. Yes   Intervention While in program utilize professionals for any questions, and attend the education sessions. Great websites to use are www.americanheart.org or www.lung.org for reliable  information.   Improve shortness of breath with ADL's Yes   Intervention While in program, learn and follow the exercise prescription taught. Start at a low level workload and increase workload ad advised by the exercise physiologist. Increase time before increasing intensity.   Develop more efficient breathing techniques such as purse lipped breathing and diaphragmatic breathing; and practicing self-pacing with activity Yes   Intervention While in program, learn and utilize the specific breathing techniques taught to you. Continue to practice and use the techniques as needed.   Increase knowledge of respiratory medications and ability to use respiratory devices properly.  Yes   Intervention While in program learn and demonstrate appropriate use of your oxygen therapy by increasing flow with exertion, manage oxygen tank operation, including continuous and intermittent flow.  Understanding oxygen is a drug ordered by your physician.   Hypertension Yes   Lipids Yes       Personal Goals Discharge:     Personal Goals at Discharge - 11/14/14 1130    Weight Management   Goals Progress/Improvement seen No   Comments Ms Knouff was hoping to lose weight during [facility], but she has maintained her weight or increased due to fluid. She is very careful about salt intake and has a nurse from [facility] who calls her regularly for  updates on her health and for education.   Increase Aerobic Exercise and Physical Activity   Goals Progress/Improvement seen  Yes   Comments Ms Waskey has  improved her by 40ft, has increased her exercise goals with the aerobic equipment in [facility], and states daily walking has improved.   Understand more about Heart/Pulmonary Disease   Goals Progress/Improvement seen  Yes   Comments Ms Espericueta has attended the education classes and has learned information for self-management of her COPD and CHF.   Improve shortness of breath with ADL's   Goals Progress/Improvement seen  Yes    Comments Ms Colee uses PLB and pacing to manage her shortness of breath.   Breathing Techniques   Goals Progress/Improvement seen  Yes   Comments Uses with activity   Increase knowledge of respiratory medications   Comments Uses her Albuterol, Flovent, and Spiriva properly; although she is concerned with the cost of the Spiriva.      Nutrition & Weight - Outcomes:      Post Biometrics - 11/09/14 1236     Post  Biometrics   Waist Circumference 42 inches   Hip Circumference 53 inches   Waist to Hip Ratio 0.79 %      Nutrition:   Nutrition Discharge:   Education Questionnaire Score:   Goals reviewed with patient; copy given to patient.

## 2014-11-14 NOTE — Patient Instructions (Addendum)
pul Discharge Instructions  Patient Details  Name: Kathleen [name] MRN: [id number] Date of Birth: Jul 20, 1945 Referring Provider:  Dione Housekeeper, *   Number of Visits: 36  Reason for Discharge:  Patient reached a stable level of exercise. Patient independent in their exercise.  Smoking History:  History  Smoking status   Not on file  Smokeless tobacco   Not on file    Diagnosis:  COPD, mild  Initial Exercise Prescription:   Discharge Exercise Prescription:     Discharge Prescription - 11/14/14 1500    Exercise Review   Progression Yes   Response to Exercise   Blood Pressure (Admit) 122/70 mmHg   Blood Pressure (Exercise) 138/80 mmHg   Blood Pressure (Exit) 118/60 mmHg   Heart Rate (Admit) 97 bpm   Heart Rate (Exercise) 94 bpm   Heart Rate (Exit) 81 bpm   Oxygen Saturation (Admit) 95 %   Oxygen Saturation (Exercise) 92 %   Oxygen Saturation (Exit) 94 %   Rating of Perceived Exertion (Exercise) 13   Perceived Dyspnea (Exercise) 3   Frequency Add 2 additional days to program exercise sessions.   Duration Progress to 50 minutes of aerobic without signs/symptoms of physical distress   Intensity Rest + 30   Progression Continue progressive overload as per policy without signs/symptoms or physical distress.   Resistance Training   Training Prescription Yes   Weight 3   Reps 10-12   Treadmill   MPH 1.8   Grade 2   Minutes 15   NuStep   Level 4   Watts 55   Minutes 15   REL-XR   Level 3   Watts 50   Minutes 15   Home Exercise Plan   Plans to continue exercise at Home  Kathleen [name] has a home [employer] or [employer] membership       Functional Capacity:     6 Minute Walk      05/22/14 0915 08/01/14 1213 11/09/14 1235   6 Minute Walk   Phase Initial Mid Program Discharge   Distance 900 feet 1040 feet 960 feet   Walk Time 6 minutes 6 minutes 6 minutes   Resting HR 83 bpm 100 bpm 95 bpm   Resting BP 122/72 mmHg 120/70 mmHg 110/80 mmHg   Max Ex. HR 85 bpm 106 bpm 109 bpm   Max Ex. BP 130/82 mmHg 144/74 mmHg 140/72 mmHg   RPE Perceived Dyspnea  Quality of Life:  GD04 Depression Questionnaire Results      Pre 5    Post 8   SF36                                                                   Pre           Post             Physical Fx     14                    19                Role Fx: Physical    5   5  Bodily Pain     8   8    General Health    12   8   Vitality                 13   9    Social Fx     6   6   Role Fx: Emotional    6   6   Mental Health    24   23   Personal Goals: Goals established at orientation with interventions provided to work toward goal.     Personal Goals and Risk Factors at Admission - 05/22/14 0915    Personal Goals and Risk Factors on Admission    Weight Management Yes   Intervention Learn and follow the exercise and diet guidelines while in the program. Utilize the nutrition and education classes to help gain knowledge of the diet and exercise expectations in the program   Admit Weight 214 lb 8 oz (97.297 kg)   Goal Weight 130 lb (58.968 kg)   Increase Aerobic Exercise and Physical Activity Yes   Intervention While in program, learn and follow the exercise prescription taught. Start at a low level workload and increase workload after able to maintain previous level for 30 minutes. Increase time before increasing intensity.   Understand more about Heart/Pulmonary Disease. Yes   Intervention While in program utilize professionals for any questions, and attend the education sessions. Great websites to use are www.americanheart.org or www.lung.org for reliable information.   Improve shortness of breath with ADL's Yes   Intervention While in program, learn and follow the exercise prescription taught. Start at a low level workload and increase workload ad advised by the exercise physiologist. Increase time before increasing intensity.   Develop more  efficient breathing techniques such as purse lipped breathing and diaphragmatic breathing; and practicing self-pacing with activity Yes   Intervention While in program, learn and utilize the specific breathing techniques taught to you. Continue to practice and use the techniques as needed.   Increase knowledge of respiratory medications and ability to use respiratory devices properly.  Yes   Intervention While in program learn and demonstrate appropriate use of your oxygen therapy by increasing flow with exertion, manage oxygen tank operation, including continuous and intermittent flow.  Understanding oxygen is a drug ordered by your physician.   Hypertension Yes   Lipids Yes       Personal Goals Discharge:     Personal Goals at Discharge - 11/14/14 1130    Weight Management   Goals Progress/Improvement seen No   Comments Kathleen [name] was hoping to lose weight during [facility], but she has maintained her weight or increased due to fluid. She is very careful about salt intake and has a nurse from [facility] who calls her regularly for  updates on her health and for education.   Increase Aerobic Exercise and Physical Activity   Goals Progress/Improvement seen  Yes   Comments Kathleen [name] has improved her by 62ft, has increased her exercise goals with the aerobic equipment in [facility], and states daily walking has improved.   Understand more about Heart/Pulmonary Disease   Goals Progress/Improvement seen  Yes   Comments Kathleen [name] has attended the education classes and has learned information for self-management of her COPD and CHF.   Improve shortness of breath with ADL's   Goals Progress/Improvement seen  Yes   Comments Kathleen [name] uses PLB and pacing to manage her shortness of breath.  Breathing Techniques   Goals Progress/Improvement seen  Yes   Comments Uses with activity   Increase knowledge of respiratory medications   Comments Uses her Albuterol, Flovent, and Spiriva properly; although she is  concerned with the cost of the Spiriva.      Nutrition & Weight - Outcomes:      Post Biometrics - 11/09/14 1236     Post  Biometrics   Waist Circumference 42 inches   Hip Circumference 53 inches   Waist to Hip Ratio 0.79 %      Nutrition:   Nutrition Discharge:   Education Questionnaire Score:   Goals reviewed with patient; copy given to patient.

## 2014-11-16 ENCOUNTER — Ambulatory Visit
Admission: RE | Admit: 2014-11-16 | Discharge: 2014-11-16 | Disposition: A | Discharge status: Home / Self Care | Payer: Medicare Other | Source: Ambulatory Visit | Attending: Family Medicine | Admitting: Family Medicine

## 2014-11-16 DIAGNOSIS — R928 Other abnormal and inconclusive findings on diagnostic imaging of breast: Secondary | ICD-10-CM

## 2014-11-16 DIAGNOSIS — R922 Inconclusive mammogram: Secondary | ICD-10-CM | POA: Diagnosis not present

## 2014-11-21 ENCOUNTER — Encounter: Payer: Medicare Other | Admitting: *Deleted

## 2014-11-21 DIAGNOSIS — J449 Chronic obstructive pulmonary disease, unspecified: Secondary | ICD-10-CM

## 2014-11-21 NOTE — Progress Notes (Signed)
Daily Session Note  Patient Details  Name: Kathleen [name] MRN: [id number] Date of Birth: [birth year] Referring Provider:  Valera Castle, *  Encounter Date: 11/21/2014  Check In:     Session Check In - 11/21/14 1201    Check-In   Staff Present Carson Myrtle BS, RRT, Respiratory Therapist;Ariyel Jeangilles RN, BSN;Steven Way BS, ACSM EP-C, Exercise Physiologist   ER physicians immediately available to respond to emergencies [facility] immediately available ER MD   Physician(s) Dr. Edd Fabian and Dr. Reita Cliche   Medication changes reported     No   Fall or balance concerns reported    No   Warm-up and Cool-down Performed on first and last piece of equipment   Pain Assessment   Currently in Pain? No/denies         Goals Met:  Proper associated with RPD/PD & O2 Sat Exercise tolerated well  Goals Unmet:  Not Applicable  Goals Comments:    Dr. Emily Filbert is Medical Director for [place] and [facility].

## 2014-11-28 ENCOUNTER — Encounter: Payer: Medicare Other | Admitting: *Deleted

## 2014-11-28 NOTE — Progress Notes (Signed)
This encounter was created in error - please disregard.

## 2015-06-05 ENCOUNTER — Other Ambulatory Visit: Payer: Self-pay | Admitting: Family Medicine

## 2015-06-05 DIAGNOSIS — R928 Other abnormal and inconclusive findings on diagnostic imaging of breast: Secondary | ICD-10-CM

## 2015-06-05 DIAGNOSIS — N6310 Unspecified lump in the right breast, unspecified quadrant: Secondary | ICD-10-CM

## 2015-06-23 ENCOUNTER — Emergency Department
Admission: EM | Admit: 2015-06-23 | Discharge: 2015-06-23 | Disposition: A | Discharge status: Home / Self Care | Payer: Medicare Other | Attending: Emergency Medicine | Admitting: Emergency Medicine

## 2015-06-23 ENCOUNTER — Emergency Department: Payer: Medicare Other

## 2015-06-23 ENCOUNTER — Encounter: Payer: Self-pay | Admitting: Emergency Medicine

## 2015-06-23 DIAGNOSIS — Z87891 Personal history of nicotine dependence: Secondary | ICD-10-CM | POA: Insufficient documentation

## 2015-06-23 DIAGNOSIS — Z7951 Long term (current) use of inhaled steroids: Secondary | ICD-10-CM | POA: Insufficient documentation

## 2015-06-23 DIAGNOSIS — Z880 Allergy status to penicillin: Secondary | ICD-10-CM | POA: Diagnosis not present

## 2015-06-23 DIAGNOSIS — M546 Pain in thoracic spine: Secondary | ICD-10-CM

## 2015-06-23 DIAGNOSIS — Z79899 Other long term (current) drug therapy: Secondary | ICD-10-CM | POA: Insufficient documentation

## 2015-06-23 DIAGNOSIS — M549 Dorsalgia, unspecified: Secondary | ICD-10-CM | POA: Diagnosis present

## 2015-06-23 HISTORY — DX: Unspecified asthma, uncomplicated: J45.909

## 2015-06-23 HISTORY — DX: Chronic obstructive pulmonary disease, unspecified: J44.9

## 2015-06-23 LAB — COMPREHENSIVE METABOLIC PANEL
ALBUMIN: 4.6 g/dL (ref 3.5–5.0)
ALT: 10 U/L — AB (ref 14–54)
AST: 12 U/L — AB (ref 15–41)
Alkaline Phosphatase: 97 U/L (ref 38–126)
Anion gap: 11 (ref 5–15)
BUN: 23 mg/dL — AB (ref 6–20)
CHLORIDE: 99 mmol/L — AB (ref 101–111)
CO2: 23 mmol/L (ref 22–32)
Calcium: 9.7 mg/dL (ref 8.9–10.3)
Creatinine, Ser: 1.47 mg/dL — ABNORMAL HIGH (ref 0.44–1.00)
GFR calc Af Amer: 41 mL/min — ABNORMAL LOW (ref >60)
GFR, EST NON AFRICAN AMERICAN: 35 mL/min — AB (ref >60)
GLUCOSE: 128 mg/dL — AB (ref 65–99)
POTASSIUM: 4.5 mmol/L (ref 3.5–5.1)
SODIUM: 133 mmol/L — AB (ref 135–145)
Total Bilirubin: 1.2 mg/dL (ref 0.3–1.2)
Total Protein: 7.9 g/dL (ref 6.5–8.1)

## 2015-06-23 LAB — TROPONIN I

## 2015-06-23 LAB — CBC WITH DIFFERENTIAL/PLATELET
BASOS ABS: 0.1 10*3/uL (ref 0–0.1)
BASOS PCT: 1 %
EOS ABS: 0 10*3/uL (ref 0–0.7)
Eosinophils Relative: 0 %
HEMATOCRIT: 44.5 % (ref 35.0–47.0)
Hemoglobin: 14.5 g/dL (ref 12.0–16.0)
Lymphocytes Relative: 6 %
Lymphs Abs: 1.1 10*3/uL (ref 1.0–3.6)
MCH: 26.8 pg (ref 26.0–34.0)
MCHC: 32.6 g/dL (ref 32.0–36.0)
MCV: 82.3 fL (ref 80.0–100.0)
MONO ABS: 1.2 10*3/uL — AB (ref 0.2–0.9)
Monocytes Relative: 7 %
NEUTROS ABS: 15.9 10*3/uL — AB (ref 1.4–6.5)
NEUTROS PCT: 86 %
Platelets: 250 10*3/uL (ref 150–440)
RBC: 5.41 MIL/uL — ABNORMAL HIGH (ref 3.80–5.20)
RDW: 15.1 % — AB (ref 11.5–14.5)
WBC: 18.4 10*3/uL — ABNORMAL HIGH (ref 3.6–11.0)

## 2015-06-23 MED ORDER — MORPHINE SULFATE (PF) 4 MG/ML IV SOLN
4.0000 mg | Freq: Once | INTRAVENOUS | Status: AC
  Administered 2015-06-23: 4 mg via INTRAVENOUS

## 2015-06-23 MED ORDER — IOHEXOL 350 MG/ML SOLN
75.0000 mL | Freq: Once | INTRAVENOUS | Status: AC | PRN
  Administered 2015-06-23: 75 mL via INTRAVENOUS

## 2015-06-23 MED ORDER — SODIUM CHLORIDE 0.9 % IV BOLUS (SEPSIS)
1000.0000 mL | Freq: Once | INTRAVENOUS | Status: AC
  Administered 2015-06-23: 1000 mL via INTRAVENOUS

## 2015-06-23 MED ORDER — MORPHINE SULFATE (PF) 2 MG/ML IV SOLN
INTRAVENOUS | Status: AC
  Administered 2015-06-23: 4 mg via INTRAVENOUS
  Filled 2015-06-23: qty 2

## 2015-06-23 MED ORDER — OXYCODONE-ACETAMINOPHEN 5-325 MG PO TABS
2.0000 | ORAL_TABLET | Freq: Once | ORAL | Status: AC
  Administered 2015-06-23: 2 via ORAL
  Filled 2015-06-23: qty 2

## 2015-06-23 MED ORDER — ONDANSETRON HCL 4 MG/2ML IJ SOLN
4.0000 mg | Freq: Once | INTRAMUSCULAR | Status: AC
  Administered 2015-06-23: 4 mg via INTRAVENOUS
  Filled 2015-06-23: qty 2

## 2015-06-23 MED ORDER — MORPHINE SULFATE (PF) 4 MG/ML IV SOLN: 4.0000 mg | Freq: Once | INTRAVENOUS | Status: AC

## 2015-06-23 MED ORDER — OXYCODONE-ACETAMINOPHEN 5-325 MG PO TABS: 1.0000 | ORAL_TABLET | Freq: Four times a day (QID) | ORAL | Status: DC | PRN

## 2015-06-23 MED ORDER — MORPHINE SULFATE (PF) 4 MG/ML IV SOLN
INTRAVENOUS | Status: DC
Start: 2015-06-23 — End: 2015-06-23
  Filled 2015-06-23: qty 1

## 2015-06-23 NOTE — ED Provider Notes (Signed)
[facility] Emergency Department Provider Note  Time seen: 2:50 PM  I have reviewed the triage vital signs and the nursing notes.   HISTORY  Chief Complaint Back Pain and Shortness of Breath    HPI Kathleen [name] is a 70 y.o. female the past medical history of atrial fibrillation, COPD, asthma who presents the emergency department with back pain. According to the patient for the past week she has noticed increased cough with congestion. Patient was prescribed an antibiotic 7 days ago by her primary care physician. Since that time has continued to have yellow mucus production with cough, over last 3 days has now developed back pain between her shoulder blades.Patient states pain is much worse with any type of movement, especially with attempting to sit up.     Past Medical History  Diagnosis Date  . Atrial fibrillation (HCC)   . COPD (chronic obstructive pulmonary disease) (HCC)   . Asthma     There are no active problems to display for this patient.   Past Surgical History  Procedure Laterality Date  . Breast biopsy Bilateral 1997    negative  . Pacemaker insertion    . Abdominal hysterectomy    . Cholecystectomy    . Hernia repair    . Cardiac catheterization      Current Outpatient Rx  Name  Route  Sig  Dispense  Refill  . albuterol (PROAIR HFA) 108 (90 BASE) MCG/ACT inhaler   Inhalation   Inhale into the lungs.         . benzonatate (TESSALON) 200 MG capsule   Oral   Take by mouth.         . cyclobenzaprine (FLEXERIL) 10 MG tablet   Oral   Take by mouth.         . fluticasone (FLONASE) 50 MCG/ACT nasal spray   Nasal   Place into the nose.         . fluticasone (FLOVENT HFA) 110 MCG/ACT inhaler   Inhalation   Inhale into the lungs.         [name] EXPIRED: levothyroxine (SYNTHROID, LEVOTHROID) 75 MCG tablet   Oral   Take by mouth.         . montelukast (SINGULAIR) 10 MG tablet   Oral   Take by mouth.         .  EXPIRED: omeprazole (PRILOSEC) 20 MG capsule   Oral   Take by mouth.         . rivaroxaban (XARELTO) 20 MG TABS tablet   Oral   Take by mouth.         . senna-docusate (SENOKOT S) 8.6-50 MG per tablet   Oral   Take by mouth.         . EXPIRED: spironolactone (ALDACTONE) 25 MG tablet   Oral   Take by mouth.         . EXPIRED: tiotropium (SPIRIVA) 18 MCG inhalation capsule   Inhalation   Place into inhaler and inhale.         . torsemide (DEMADEX) 20 MG tablet      TAKE 2 TABLETS BY MOUTH ONCE DAILY           Allergies Dronedarone; Clindamycin; Meperidine; Penicillins; Rosuvastatin; Tetracycline; Lovastatin; Other; and Pneumococcal 13-val conj vacc  Family History  Problem Relation Age of Onset  . Breast cancer Mother 102    3 different times    Social History Social History  Substance Use Topics  .  Smoking status: Former Games developer  . Smokeless tobacco: Never Used  . Alcohol Use: No    Review of Systems Constitutional: Negative for fever. Cardiovascular: Negative for chest pain. Respiratory: Negative for shortness of breath. Gastrointestinal: Negative for abdominal pain Musculoskeletal: Positive for mid thoracic back pain. Neurological: Negative for headaches, focal weakness or numbness. 10-point ROS otherwise negative.  ____________________________________________   PHYSICAL EXAM:  VITAL SIGNS: ED Triage Vitals  Enc Vitals Group     BP --      Pulse --      Resp --      Temp --      Temp src --      SpO2 --      Weight --      Height --      Head Cir --      Peak Flow --      Pain Score 06/23/15 1424 10     Pain Loc --      Pain Edu? --      Excl. in GC? --     Constitutional: Alert and oriented. Well appearing and in no distress. Eyes: Normal exam ENT   Head: Normocephalic and atraumatic.   Mouth/Throat: Mucous membranes are moist. Cardiovascular: Normal rate, regular rhythm. No murmur Respiratory: Normal respiratory  effort without tachypnea nor retractions. Breath sounds are clear and equal bilaterally. No wheezes/rales/rhonchi. Gastrointestinal: Soft and nontender. No distention.   Musculoskeletal: Good range of motion in all extremities. No lower extremity tenderness or obvious edema. Patient does have mild tenderness to palpation of the mid thoracic spine especially in the paraspinal areas. No deformities noted. Neurologic:  Normal speech and language. No gross focal neurologic deficits  Skin:  Skin is warm, dry and intact.  Psychiatric: Mood and affect are normal. Speech and behavior are normal.  ____________________________________________    EKG  EGD done interpreted by myself shows a ventricular paced rhythm at 80 bpm, wide QRS consistent with ventricular pacemaker.  ____________________________________________    RADIOLOGY  Chest x-ray shows mild cardiomegaly, minimal vascular congestion. CT negative for PE.  ____________________________________________    INITIAL IMPRESSION / ASSESSMENT AND PLAN / ED COURSE  Pertinent labs & imaging results that were available during my care of the patient were reviewed by me and considered in my medical decision making (see chart for details).  Patient presents with cough with sputum production, now with back pain for the past several days. States back pain is worsened and is now painful for her to get out of bed. Patient has history patient for fibrillation is on Xarelto. We will check labs, chest x-ray, and close to monitor in the emergency department.  I discussed results with the patient. CT does not appear to show any acute findings. I discussed the enlarged aorta and pulmonary nodule, recommended follow up with her primary care physician for repeat imaging. Patient is agreeable. We'll prescribe pain medication and discharge patient home. I discussed return precautions. Likely musculoskeletal pain given largely normal  workup. ____________________________________________   FINAL CLINICAL IMPRESSION(S) / ED DIAGNOSES  Back pain   [name], MD 06/23/15 1757

## 2015-06-23 NOTE — Discharge Instructions (Signed)

## 2015-06-23 NOTE — ED Notes (Signed)
Pt arrived via EMS for complaints of back pain and shortness of breath. EMS reports CBG 150, 140/88.

## 2015-06-24 ENCOUNTER — Ambulatory Visit: Payer: Medicare Other | Attending: Family Medicine

## 2015-06-24 ENCOUNTER — Other Ambulatory Visit: Payer: Medicare Other

## 2015-07-05 ENCOUNTER — Ambulatory Visit
Admission: RE | Admit: 2015-07-05 | Discharge: 2015-07-05 | Disposition: A | Discharge status: Home / Self Care | Payer: Medicare Other | Source: Ambulatory Visit | Attending: Family Medicine | Admitting: Family Medicine

## 2015-07-05 ENCOUNTER — Other Ambulatory Visit: Payer: Self-pay | Admitting: Family Medicine

## 2015-07-05 DIAGNOSIS — N6310 Unspecified lump in the right breast, unspecified quadrant: Secondary | ICD-10-CM

## 2015-07-05 DIAGNOSIS — N6001 Solitary cyst of right breast: Secondary | ICD-10-CM | POA: Diagnosis not present

## 2015-07-05 DIAGNOSIS — R928 Other abnormal and inconclusive findings on diagnostic imaging of breast: Secondary | ICD-10-CM

## 2015-07-19 ENCOUNTER — Emergency Department
Admission: EM | Admit: 2015-07-19 | Discharge: 2015-07-19 | Disposition: A | Discharge status: Home / Self Care | Payer: Medicare Other | Attending: Emergency Medicine | Admitting: Emergency Medicine

## 2015-07-19 ENCOUNTER — Emergency Department: Payer: Medicare Other

## 2015-07-19 DIAGNOSIS — M10072 Idiopathic gout, left ankle and foot: Secondary | ICD-10-CM | POA: Diagnosis not present

## 2015-07-19 DIAGNOSIS — Z7951 Long term (current) use of inhaled steroids: Secondary | ICD-10-CM | POA: Insufficient documentation

## 2015-07-19 DIAGNOSIS — Z87891 Personal history of nicotine dependence: Secondary | ICD-10-CM | POA: Insufficient documentation

## 2015-07-19 DIAGNOSIS — Z79899 Other long term (current) drug therapy: Secondary | ICD-10-CM | POA: Diagnosis not present

## 2015-07-19 DIAGNOSIS — Z880 Allergy status to penicillin: Secondary | ICD-10-CM | POA: Diagnosis not present

## 2015-07-19 DIAGNOSIS — M79672 Pain in left foot: Secondary | ICD-10-CM | POA: Diagnosis present

## 2015-07-19 DIAGNOSIS — M109 Gout, unspecified: Secondary | ICD-10-CM

## 2015-07-19 HISTORY — DX: Presence of cardiac pacemaker: Z95.0

## 2015-07-19 HISTORY — DX: Other specified interstitial pulmonary diseases: J84.89

## 2015-07-19 LAB — COMPREHENSIVE METABOLIC PANEL
ALBUMIN: 4.2 g/dL (ref 3.5–5.0)
ALT: 12 U/L — ABNORMAL LOW (ref 14–54)
AST: 13 U/L — AB (ref 15–41)
Alkaline Phosphatase: 82 U/L (ref 38–126)
Anion gap: 7 (ref 5–15)
BUN: 18 mg/dL (ref 6–20)
CHLORIDE: 102 mmol/L (ref 101–111)
CO2: 26 mmol/L (ref 22–32)
Calcium: 9.4 mg/dL (ref 8.9–10.3)
Creatinine, Ser: 0.85 mg/dL (ref 0.44–1.00)
GFR calc Af Amer: >60 mL/min (ref >60)
GFR calc non Af Amer: >60 mL/min (ref >60)
GLUCOSE: 104 mg/dL — AB (ref 65–99)
POTASSIUM: 4.3 mmol/L (ref 3.5–5.1)
Sodium: 135 mmol/L (ref 135–145)
Total Bilirubin: 1.5 mg/dL — ABNORMAL HIGH (ref 0.3–1.2)
Total Protein: 7 g/dL (ref 6.5–8.1)

## 2015-07-19 LAB — CBC WITH DIFFERENTIAL/PLATELET
Basophils Absolute: 0.1 10*3/uL (ref 0–0.1)
Basophils Relative: 1 %
EOS PCT: 1 %
Eosinophils Absolute: 0.1 10*3/uL (ref 0–0.7)
HCT: 42.1 % (ref 35.0–47.0)
Hemoglobin: 13.8 g/dL (ref 12.0–16.0)
LYMPHS ABS: 1.1 10*3/uL (ref 1.0–3.6)
LYMPHS PCT: 10 %
MCH: 27.4 pg (ref 26.0–34.0)
MCHC: 32.7 g/dL (ref 32.0–36.0)
MCV: 83.6 fL (ref 80.0–100.0)
MONO ABS: 0.8 10*3/uL (ref 0.2–0.9)
Monocytes Relative: 7 %
Neutro Abs: 9.8 10*3/uL — ABNORMAL HIGH (ref 1.4–6.5)
Neutrophils Relative %: 81 %
PLATELETS: 178 10*3/uL (ref 150–440)
RBC: 5.04 MIL/uL (ref 3.80–5.20)
RDW: 16.4 % — AB (ref 11.5–14.5)
WBC: 12 10*3/uL — ABNORMAL HIGH (ref 3.6–11.0)

## 2015-07-19 LAB — URIC ACID: URIC ACID, SERUM: 10 mg/dL — AB (ref 2.3–6.6)

## 2015-07-19 MED ORDER — COLCHICINE 0.6 MG PO TABS: 0.6000 mg | ORAL_TABLET | Freq: Every day | ORAL | Status: DC

## 2015-07-19 NOTE — ED Provider Notes (Signed)
[facility] Emergency Department Provider Note  ____________________________________________  Time seen: Approximately 2:03 PM  I have reviewed the triage vital signs and the nursing notes.   HISTORY  Chief Complaint Foot Pain    HPI Kathleen [name] is a 70 y.o. female who presents emergency department complaining moderate articular joint pain. Patient is complaining of left ankle/foot pain.Patient states that she dropped something on her foot 4-5 weeks ago but pain had resolved prior to this. She is concerned that she may have injured her foot in that encounter. Patient denies any recent injury to area. She endorses swelling to the left ankle. She denies any numbness or tingling in her distal extremity. She denies any history of gout. 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 She has had no history of this complaint in the past.   Past Medical History  Diagnosis Date  . Atrial fibrillation (HCC)   . COPD (chronic obstructive pulmonary disease) (HCC)   . Asthma   . Pacemaker   . 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HCC)   . 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HCC)     There are no active problems to display for this patient.   Past Surgical History  Procedure Laterality Date  . Breast biopsy Bilateral 1997    negative  . Pacemaker insertion    . Abdominal hysterectomy    . Cholecystectomy    . Hernia repair    . Cardiac catheterization      Current Outpatient Rx  Name  Route  Sig  Dispense  Refill  . albuterol (PROAIR HFA) 108 (90 BASE) MCG/ACT inhaler   Inhalation   Inhale into the lungs.         . benzonatate (TESSALON) 200 MG capsule   Oral   Take by mouth.         . colchicine 0.6 MG tablet   Oral   Take 1 tablet (0.6 mg total) by mouth daily. Take 2 tablets first day, 1 hour later take 1 more tab for a total of 3 tabs  on the 1st day Take 1 tab daily for at least 6 more days. If  Symptoms persist past 6 days continue to use until prescription is finished   20 tablet   0   . cyclobenzaprine (FLEXERIL) 10 MG tablet   Oral   Take by mouth.         . fluticasone (FLONASE) 50 MCG/ACT nasal spray   Nasal   Place into the nose.         . fluticasone (FLOVENT HFA) 110 MCG/ACT inhaler   Inhalation   Inhale into the lungs.         [name] EXPIRED: levothyroxine (SYNTHROID, LEVOTHROID) 75 MCG tablet   Oral   Take by mouth.         . EXPIRED: montelukast (SINGULAIR) 10 MG tablet   Oral   Take by mouth.         . EXPIRED: omeprazole (PRILOSEC) 20 MG capsule   Oral   Take by mouth.         . oxyCODONE-acetaminophen (ROXICET) 5-325 MG tablet   Oral   Take 1 tablet by mouth every 6 (six) hours as needed.   20 tablet   0   . rivaroxaban (XARELTO) 20 MG TABS tablet   Oral   Take by mouth.         [name]  senna-docusate (SENOKOT S) 8.6-50 MG per tablet   Oral   Take by mouth.         . EXPIRED: spironolactone (ALDACTONE) 25 MG tablet   Oral   Take by mouth.         . EXPIRED: tiotropium (SPIRIVA) 18 MCG inhalation capsule   Inhalation   Place into inhaler and inhale.         . torsemide (DEMADEX) 20 MG tablet      TAKE 2 TABLETS BY MOUTH ONCE DAILY           Allergies Dronedarone; Clindamycin; Meperidine; Penicillins; Rosuvastatin; Tetracycline; Lovastatin; Other; and Pneumococcal 13-val conj vacc  Family History  Problem Relation Age of Onset  . Breast cancer Mother 53    3 different times  . Breast cancer Cousin     Social History Social History  Substance Use Topics  . Smoking status: Former Games developer  . Smokeless tobacco: Never Used  . Alcohol Use: No     Review of Systems  Constitutional: No fever/chills Cardiovascular: no chest pain. Respiratory: no cough. No SOB. Musculoskeletal: Negative for back pain. Patient reports left ankle pain. Skin: Negative for  rash. Neurological: Negative for headaches, focal weakness or numbness. 10-point ROS otherwise negative.  ____________________________________________   PHYSICAL EXAM:  VITAL SIGNS: ED Triage Vitals  Enc Vitals Group     BP 07/19/15 1248 129/66 mmHg     Pulse Rate 07/19/15 1248 85     Resp 07/19/15 1248 22     Temp 07/19/15 1248 97.9 F (36.6 C)     Temp Source 07/19/15 1248 Oral     SpO2 07/19/15 1248 97 %     Weight 07/19/15 1248 218 lb (98.884 kg)     Height 07/19/15 1248 5\' 2"  (1.575 m)     Head Cir --      Peak Flow --      Pain Score 07/19/15 1312 10     Pain Loc --      Pain Edu? --      Excl. in GC? --      Constitutional: Alert and oriented. Well appearing and in no acute distress. Eyes: Conjunctivae are normal. PERRL. EOMI. Head: Atraumatic. Neck: No stridor.   Hematological/Lymphatic/Immunilogical: No cervical lymphadenopathy. Cardiovascular: Normal rate, regular rhythm. Normal S1 and S2.  Good peripheral circulation. Respiratory: Normal respiratory effort without tachypnea or retractions. Lungs CTAB. Musculoskeletal: Edema is noted to the left ankle when compared with right. Area is mildly erythematous. Patient endorses tenderness to palpation over the anterior aspect of the ankle. No palpable abnormality. Area is very warm to palpation. Patient does have full range of motion to ankle. Dorsalis pedis pulses intact. Sensation intact 5 digits and equal to unaffected extremity. Neurologic:  Normal speech and language. No gross focal neurologic deficits are appreciated.  Skin:  Skin is warm, dry and intact. No rash noted. Psychiatric: Mood and affect are normal. Speech and behavior are normal. Patient exhibits appropriate insight and judgement.   ____________________________________________   LABS (all labs ordered are listed, but only abnormal results are displayed)  Labs Reviewed  URIC ACID - Abnormal; Notable for the following:    Uric Acid, Serum 10.0  (*)    All other components within normal limits  COMPREHENSIVE METABOLIC PANEL - Abnormal; Notable for the following:    Glucose, Bld 104 (*)    AST 13 (*)    ALT 12 (*)    Total Bilirubin 1.5 (*)  All other components within normal limits  CBC WITH DIFFERENTIAL/PLATELET - Abnormal; Notable for the following:    WBC 12.0 (*)    RDW 16.4 (*)    Neutro Abs 9.8 (*)    All other components within normal limits   ____________________________________________  EKG   ____________________________________________  RADIOLOGY Festus Barren Veatrice Eckstein, personally viewed and evaluated these images (plain radiographs) as part of my medical decision making, as well as reviewing the written report by the radiologist.  Dg Foot Complete Left  07/19/2015  CLINICAL DATA:  Dorsal left foot pain for 1.5 years, much worse in the past 2 days. Swelling. No known injury. EXAM: LEFT FOOT - COMPLETE 3+ VIEW COMPARISON:  None. FINDINGS: There is moderate diffuse soft tissue swelling and fat reticulation throughout the foot and lower leg. No acute fracture or dislocation is identified. A small plantar calcaneal enthesophyte is noted. Mild degenerative changes are noted in the midfoot. The bones appear osteopenic. No destructive osseous lesion is identified. IMPRESSION: Diffuse soft tissue swelling. No acute osseous abnormality identified. Electronically Signed   By: Sebastian Ache M.D.   On: 07/19/2015 13:56    ____________________________________________    PROCEDURES  Procedure(s) performed:       Medications - No data to display   ____________________________________________   INITIAL IMPRESSION / ASSESSMENT AND PLAN / ED COURSE  Pertinent labs & imaging results that were available during my care of the patient were reviewed by me and considered in my medical decision making (see chart for details).  Patient's diagnosis is consistent with gouty arthritis to the left ankle. Patient's labs are  reassuring other than high uric acid level.. Patient will be discharged home with prescriptions for colchicine. Patient is to follow up with primary care provider if symptoms persist past this treatment course. Patient is given ED precautions to return to the ED for any worsening or new symptoms.     ____________________________________________  FINAL CLINICAL IMPRESSION(S) / ED DIAGNOSES  Final diagnoses:  Gouty arthritis      NEW MEDICATIONS STARTED DURING THIS VISIT:  Discharge Medication List as of 07/19/2015  3:31 PM    START taking these medications   Details  colchicine 0.6 MG tablet Take 1 tablet (0.6 mg total) by mouth daily. Take 2 tablets first day, 1 hour later take 1 more tab for a total of 3 tabs on the 1st day Take 1 tab daily for at least 6 more days. If  Symptoms persist past 6 days continue to use until prescription is fi nished, Starting 07/19/2015, Until Discontinued, Print            Racheal Patches, PA-C 07/19/15 1605  Sharyn Creamer, MD 07/21/15 2307

## 2015-07-19 NOTE — ED Notes (Signed)
Pt reports pain to her left foot  Pt reports dropping something out of the freezer onto it approx 5 weeks ago   Pain to that foot really began two days ago  - pt has applied heating pads and she has been taking oxycodone for the pain yet nothing helps  Edema present

## 2015-07-19 NOTE — Discharge Instructions (Signed)
Gout °Gout is an inflammatory arthritis caused by a buildup of uric acid crystals in the joints. Uric acid is a chemical that is normally present in the blood. When the level of uric acid in the blood is too high it can form crystals that deposit in your joints and tissues. This causes joint redness, soreness, and swelling (inflammation). Repeat attacks are common. Over time, uric acid crystals can form into masses (tophi) near a joint, destroying bone and causing disfigurement. Gout is treatable and often preventable. °CAUSES  °The disease begins with elevated levels of uric acid in the blood. Uric acid is produced by your body when it breaks down a naturally found substance called purines. Certain foods you eat, such as meats and fish, contain high amounts of purines. Causes of an elevated uric acid level include: °· Being passed down from parent to child (heredity). °· Diseases that cause increased uric acid production (such as obesity, psoriasis, and certain cancers). °· Excessive alcohol use. °· Diet, especially diets rich in meat and seafood. °· Medicines, including certain cancer-fighting medicines (chemotherapy), water pills (diuretics), and aspirin. °· Chronic kidney disease. The kidneys are no longer able to remove uric acid well. °· Problems with metabolism. °Conditions strongly associated with gout include: °· Obesity. °· High blood pressure. °· High cholesterol. °· Diabetes. °Not everyone with elevated uric acid levels gets gout. It is not understood why some people get gout and others do not. Surgery, joint injury, and eating too much of certain foods are some of the factors that can lead to gout attacks. °SYMPTOMS  °· An attack of gout comes on quickly. It causes intense pain with redness, swelling, and warmth in a joint. °· Fever can occur. °· Often, only one joint is involved. Certain joints are more commonly involved: °· Base of the big toe. °· Knee. °· Ankle. °· Wrist. °· Finger. °Without  treatment, an attack usually goes away in a few days to weeks. Between attacks, you usually will not have symptoms, which is different from many other forms of arthritis. °DIAGNOSIS  °Your caregiver will suspect gout based on your symptoms and exam. In some cases, tests may be recommended. The tests may include: °· Blood tests. °· Urine tests. °· X-rays. °· Joint fluid exam. This exam requires a needle to remove fluid from the joint (arthrocentesis). Using a microscope, gout is confirmed when uric acid crystals are seen in the joint fluid. °TREATMENT  °There are two phases to gout treatment: treating the sudden onset (acute) attack and preventing attacks (prophylaxis). °· Treatment of an Acute Attack. °· Medicines are used. These include anti-inflammatory medicines or steroid medicines. °· An injection of steroid medicine into the affected joint is sometimes necessary. °· The painful joint is rested. Movement can worsen the arthritis. °· You may use warm or cold treatments on painful joints, depending which works best for you. °· Treatment to Prevent Attacks. °· If you suffer from frequent gout attacks, your caregiver may advise preventive medicine. These medicines are started after the acute attack subsides. These medicines either help your kidneys eliminate uric acid from your body or decrease your uric acid production. You may need to stay on these medicines for a very long time. °· The early phase of treatment with preventive medicine can be associated with an increase in acute gout attacks. For this reason, during the first few months of treatment, your caregiver may also advise you to take medicines usually used for acute gout treatment. Be sure you   understand your caregiver's directions. Your caregiver may make several adjustments to your medicine dose before these medicines are effective. °· Discuss dietary treatment with your caregiver or dietitian. Alcohol and drinks high in sugar and fructose and foods  such as meat, poultry, and seafood can increase uric acid levels. Your caregiver or dietitian can advise you on drinks and foods that should be limited. °HOME CARE INSTRUCTIONS  °· Do not take aspirin to relieve pain. This raises uric acid levels. °· Only take over-the-counter or prescription medicines for pain, discomfort, or fever as directed by your caregiver. °· Rest the joint as much as possible. When in bed, keep sheets and blankets off painful areas. °· Keep the affected joint raised (elevated). °· Apply warm or cold treatments to painful joints. Use of warm or cold treatments depends on which works best for you. °· Use crutches if the painful joint is in your leg. °· Drink enough fluids to keep your urine clear or pale yellow. This helps your body get rid of uric acid. Limit alcohol, sugary drinks, and fructose drinks. °· Follow your dietary instructions. Pay careful attention to the amount of protein you eat. Your daily diet should emphasize fruits, vegetables, whole grains, and fat-free or low-fat milk products. Discuss the use of coffee, vitamin C, and cherries with your caregiver or dietitian. These may be helpful in lowering uric acid levels. °· Maintain a healthy body weight. °SEEK MEDICAL CARE IF:  °· You develop diarrhea, vomiting, or any side effects from medicines. °· You do not feel better in 24 hours, or you are getting worse. °SEEK IMMEDIATE MEDICAL CARE IF:  °· Your joint becomes suddenly more tender, and you have chills or a fever. °MAKE SURE YOU:  °· Understand these instructions. °· Will watch your condition. °· Will get help right away if you are not doing well or get worse. °  °This information is not intended to replace advice given to you by your health care provider. Make sure you discuss any questions you have with your health care provider. °  °Document Released: 05/22/2000 Document Revised: 06/15/2014 Document Reviewed: 01/06/2012 °Elsevier Interactive Patient Education ©2016  [employer] ° °Low-Purine Diet °Purines are compounds that affect the level of uric acid in your body. A low-purine diet is a diet that is low in purines. Eating a low-purine diet can prevent the level of uric acid in your body from getting too high and causing gout or kidney stones or both. °WHAT DO I NEED TO KNOW ABOUT THIS DIET? °· Choose low-purine foods. Examples of low-purine foods are listed in the next section. °· Drink plenty of fluids, especially water. Fluids can help remove uric acid from your body. Try to drink 8-16 cups (1.9-3.8 L) a day. °· Limit foods high in fat, especially saturated fat, as fat makes it harder for the body to get rid of uric acid. Foods high in saturated fat include pizza, cheese, ice cream, whole milk, fried foods, and gravies. Choose foods that are lower in fat and lean sources of protein. Use olive oil when cooking as it contains healthy fats that are not high in saturated fat. °· Limit alcohol. Alcohol interferes with the elimination of uric acid from your body. If you are having a gout attack, avoid all alcohol. °· Keep in mind that different people's bodies react differently to different foods. You will probably learn over time which foods do or do not affect you. If you discover that a food tends to   cause your gout to flare up, avoid eating that food. You can more freely enjoy foods that do not cause problems. If you have any questions about a food item, talk to your dietitian or health care provider. °WHICH FOODS ARE LOW, MODERATE, AND HIGH IN PURINES? °The following is a list of foods that are low, moderate, and high in purines. You can eat any amount of the foods that are low in purines. You may be able to have small amounts of foods that are moderate in purines. Ask your health care provider how much of a food moderate in purines you can have. Avoid foods high in purines. °Grains °· Foods low in purines: Enriched white bread, pasta, rice, cake, cornbread,  popcorn. °· Foods moderate in purines: Whole-grain breads and cereals, wheat germ, bran, oatmeal. Uncooked oatmeal. Dry wheat bran or wheat germ. °· Foods high in purines: Pancakes, French toast, biscuits, muffins. °Vegetables °· Foods low in purines: All vegetables, except those that are moderate in purines. °· Foods moderate in purines: Asparagus, cauliflower, spinach, mushrooms, green peas. °Fruits °· All fruits are low in purines. °Meats and other Protein Foods °· Foods low in purines: Eggs, nuts, peanut butter. °· Foods moderate in purines: 80-90% lean beef, lamb, veal, pork, poultry, fish, eggs, peanut butter, nuts. Crab, lobster, oysters, and shrimp. Cooked dried beans, peas, and lentils. °· Foods high in purines: Anchovies, sardines, herring, mussels, tuna, codfish, scallops, trout, and haddock. Bacon. Organ meats (such as liver or kidney). Tripe. Game meat. Goose. Sweetbreads. °Dairy °· All dairy foods are low in purines. Low-fat and fat-free dairy products are best because they are low in saturated fat. °Beverages °· Drinks low in purines: Water, carbonated beverages, tea, coffee, cocoa. °· Drinks moderate in purines: Soft drinks and other drinks sweetened with high-fructose corn syrup. Juices. To find whether a food or drink is sweetened with high-fructose corn syrup, look at the ingredients list. °· Drinks high in purines: Alcoholic beverages (such as beer). °Condiments °· Foods low in purines: Salt, herbs, olives, pickles, relishes, vinegar. °· Foods moderate in purines: Butter, margarine, oils, mayonnaise. °Fats and Oils °· Foods low in purines: All types, except gravies and sauces made with meat. °· Foods high in purines: Gravies and sauces made with meat. °Other Foods °· Foods low in purines: Sugars, sweets, gelatin. Cake. Soups made without meat. °· Foods moderate in purines: Meat-based or fish-based soups, broths, or bouillons. Foods and drinks sweetened with high-fructose corn syrup. °· Foods high  in purines: High-fat desserts (such as ice cream, cookies, cakes, pies, doughnuts, and chocolate). °Contact your dietitian for more information on foods that are not listed here. °  °This information is not intended to replace advice given to you by your health care provider. Make sure you discuss any questions you have with your health care provider. °  °Document Released: 09/19/2010 Document Revised: 05/30/2013 Document Reviewed: 05/01/2013 °Elsevier Interactive Patient Education ©2016 [employer] ° °

## 2015-09-08 ENCOUNTER — Inpatient Hospital Stay
Admission: EM | Admit: 2015-09-08 | Discharge: 2015-09-11 | DRG: 190 | Disposition: A | Discharge status: Home / Self Care | Payer: Medicare Other | Attending: Internal Medicine | Admitting: Internal Medicine

## 2015-09-08 ENCOUNTER — Emergency Department: Payer: Medicare Other

## 2015-09-08 ENCOUNTER — Encounter: Payer: Self-pay | Admitting: Emergency Medicine

## 2015-09-08 ENCOUNTER — Other Ambulatory Visit: Payer: Self-pay

## 2015-09-08 DIAGNOSIS — J441 Chronic obstructive pulmonary disease with (acute) exacerbation: Secondary | ICD-10-CM | POA: Diagnosis present

## 2015-09-08 DIAGNOSIS — R509 Fever, unspecified: Secondary | ICD-10-CM

## 2015-09-08 DIAGNOSIS — Z9049 Acquired absence of other specified parts of digestive tract: Secondary | ICD-10-CM

## 2015-09-08 DIAGNOSIS — Z87891 Personal history of nicotine dependence: Secondary | ICD-10-CM

## 2015-09-08 DIAGNOSIS — Z7951 Long term (current) use of inhaled steroids: Secondary | ICD-10-CM

## 2015-09-08 DIAGNOSIS — Z888 Allergy status to other drugs, medicaments and biological substances status: Secondary | ICD-10-CM | POA: Diagnosis not present

## 2015-09-08 DIAGNOSIS — I4892 Unspecified atrial flutter: Secondary | ICD-10-CM | POA: Diagnosis present

## 2015-09-08 DIAGNOSIS — J159 Unspecified bacterial pneumonia: Secondary | ICD-10-CM

## 2015-09-08 DIAGNOSIS — I482 Chronic atrial fibrillation: Secondary | ICD-10-CM | POA: Diagnosis present

## 2015-09-08 DIAGNOSIS — E876 Hypokalemia: Secondary | ICD-10-CM | POA: Diagnosis present

## 2015-09-08 DIAGNOSIS — Z880 Allergy status to penicillin: Secondary | ICD-10-CM

## 2015-09-08 DIAGNOSIS — D696 Thrombocytopenia, unspecified: Secondary | ICD-10-CM | POA: Diagnosis present

## 2015-09-08 DIAGNOSIS — Z90710 Acquired absence of both cervix and uterus: Secondary | ICD-10-CM

## 2015-09-08 DIAGNOSIS — E871 Hypo-osmolality and hyponatremia: Secondary | ICD-10-CM | POA: Diagnosis present

## 2015-09-08 DIAGNOSIS — Z803 Family history of malignant neoplasm of breast: Secondary | ICD-10-CM | POA: Diagnosis not present

## 2015-09-08 DIAGNOSIS — Z9889 Other specified postprocedural states: Secondary | ICD-10-CM

## 2015-09-08 DIAGNOSIS — Z79899 Other long term (current) drug therapy: Secondary | ICD-10-CM | POA: Diagnosis not present

## 2015-09-08 DIAGNOSIS — B370 Candidal stomatitis: Secondary | ICD-10-CM | POA: Diagnosis present

## 2015-09-08 DIAGNOSIS — E039 Hypothyroidism, unspecified: Secondary | ICD-10-CM | POA: Diagnosis present

## 2015-09-08 DIAGNOSIS — J189 Pneumonia, unspecified organism: Secondary | ICD-10-CM

## 2015-09-08 DIAGNOSIS — E440 Moderate protein-calorie malnutrition: Secondary | ICD-10-CM | POA: Insufficient documentation

## 2015-09-08 DIAGNOSIS — J440 Chronic obstructive pulmonary disease with acute lower respiratory infection: Principal | ICD-10-CM | POA: Diagnosis present

## 2015-09-08 DIAGNOSIS — J9621 Acute and chronic respiratory failure with hypoxia: Secondary | ICD-10-CM | POA: Diagnosis present

## 2015-09-08 DIAGNOSIS — Z9981 Dependence on supplemental oxygen: Secondary | ICD-10-CM

## 2015-09-08 DIAGNOSIS — J9601 Acute respiratory failure with hypoxia: Secondary | ICD-10-CM

## 2015-09-08 LAB — CBC WITH DIFFERENTIAL/PLATELET
BASOS ABS: 0 10*3/uL (ref 0–0.1)
BASOS PCT: 1 %
EOS ABS: 0 10*3/uL (ref 0–0.7)
EOS PCT: 0 %
HCT: 41.2 % (ref 35.0–47.0)
Hemoglobin: 14.1 g/dL (ref 12.0–16.0)
LYMPHS PCT: 12 %
Lymphs Abs: 0.8 10*3/uL — ABNORMAL LOW (ref 1.0–3.6)
MCH: 28.7 pg (ref 26.0–34.0)
MCHC: 34.2 g/dL (ref 32.0–36.0)
MCV: 83.8 fL (ref 80.0–100.0)
MONO ABS: 0.5 10*3/uL (ref 0.2–0.9)
Monocytes Relative: 8 %
Neutro Abs: 4.9 10*3/uL (ref 1.4–6.5)
Neutrophils Relative %: 79 %
PLATELETS: 120 10*3/uL — AB (ref 150–440)
RBC: 4.91 MIL/uL (ref 3.80–5.20)
RDW: 16.3 % — AB (ref 11.5–14.5)
WBC: 6.2 10*3/uL (ref 3.6–11.0)

## 2015-09-08 LAB — COMPREHENSIVE METABOLIC PANEL
ALBUMIN: 3.7 g/dL (ref 3.5–5.0)
ALT: 12 U/L — AB (ref 14–54)
AST: 19 U/L (ref 15–41)
Alkaline Phosphatase: 54 U/L (ref 38–126)
Anion gap: 10 (ref 5–15)
BUN: 10 mg/dL (ref 6–20)
CALCIUM: 8.8 mg/dL — AB (ref 8.9–10.3)
CHLORIDE: 100 mmol/L — AB (ref 101–111)
CO2: 24 mmol/L (ref 22–32)
CREATININE: 0.88 mg/dL (ref 0.44–1.00)
GFR calc Af Amer: >60 mL/min (ref >60)
Glucose, Bld: 168 mg/dL — ABNORMAL HIGH (ref 65–99)
POTASSIUM: 3.6 mmol/L (ref 3.5–5.1)
SODIUM: 134 mmol/L — AB (ref 135–145)
Total Bilirubin: 1.7 mg/dL — ABNORMAL HIGH (ref 0.3–1.2)
Total Protein: 6.8 g/dL (ref 6.5–8.1)

## 2015-09-08 LAB — PROTIME-INR
INR: 1.11
Prothrombin Time: 14.5 seconds (ref 11.4–15.0)

## 2015-09-08 LAB — URIC ACID: Uric Acid, Serum: 6.8 mg/dL — ABNORMAL HIGH (ref 2.3–6.6)

## 2015-09-08 LAB — TROPONIN I

## 2015-09-08 LAB — RAPID INFLUENZA A&B ANTIGENS (ARMC ONLY)
INFLUENZA A (ARMC): NEGATIVE
INFLUENZA B (ARMC): NEGATIVE

## 2015-09-08 LAB — BRAIN NATRIURETIC PEPTIDE: B Natriuretic Peptide: 100 pg/mL (ref 0.0–100.0)

## 2015-09-08 MED ORDER — DEXTROSE 5 % IV SOLN
500.0000 mg | INTRAVENOUS | Status: DC
  Administered 2015-09-09 – 2015-09-10 (×2): 500 mg via INTRAVENOUS
  Filled 2015-09-08 (×3): qty 500

## 2015-09-08 MED ORDER — GUAIFENESIN ER 600 MG PO TB12
600.0000 mg | ORAL_TABLET | Freq: Two times a day (BID) | ORAL | Status: DC
  Administered 2015-09-08 – 2015-09-11 (×6): 600 mg via ORAL
  Filled 2015-09-08 (×6): qty 1

## 2015-09-08 MED ORDER — ALBUTEROL SULFATE (2.5 MG/3ML) 0.083% IN NEBU
2.5000 mg | INHALATION_SOLUTION | Freq: Four times a day (QID) | RESPIRATORY_TRACT | Status: DC
  Administered 2015-09-08 – 2015-09-11 (×12): 2.5 mg via RESPIRATORY_TRACT
  Filled 2015-09-08 (×12): qty 3

## 2015-09-08 MED ORDER — AZITHROMYCIN 500 MG IV SOLR
500.0000 mg | INTRAVENOUS | Status: DC
  Filled 2015-09-08: qty 500

## 2015-09-08 MED ORDER — POTASSIUM CHLORIDE CRYS ER 20 MEQ PO TBCR
20.0000 meq | EXTENDED_RELEASE_TABLET | Freq: Once | ORAL | Status: AC
  Administered 2015-09-08: 20 meq via ORAL
  Filled 2015-09-08: qty 1

## 2015-09-08 MED ORDER — FLUTICASONE PROPIONATE 50 MCG/ACT NA SUSP
2.0000 | Freq: Every day | NASAL | Status: DC
  Administered 2015-09-09 – 2015-09-11 (×3): 2 via NASAL
  Filled 2015-09-08: qty 16

## 2015-09-08 MED ORDER — SPIRONOLACTONE 25 MG PO TABS
75.0000 mg | ORAL_TABLET | Freq: Every day | ORAL | Status: DC
  Administered 2015-09-08 – 2015-09-11 (×4): 75 mg via ORAL
  Filled 2015-09-08 (×4): qty 3

## 2015-09-08 MED ORDER — METHYLPREDNISOLONE SODIUM SUCC 125 MG IJ SOLR
60.0000 mg | INTRAMUSCULAR | Status: DC
  Administered 2015-09-09 – 2015-09-10 (×2): 60 mg via INTRAVENOUS
  Filled 2015-09-08 (×2): qty 2

## 2015-09-08 MED ORDER — ONDANSETRON HCL 4 MG PO TABS
4.0000 mg | ORAL_TABLET | Freq: Four times a day (QID) | ORAL | Status: DC | PRN
  Administered 2015-09-10: 4 mg via ORAL
  Filled 2015-09-08: qty 1

## 2015-09-08 MED ORDER — HYDROCOD POLST-CPM POLST ER 10-8 MG/5ML PO SUER
5.0000 mL | Freq: Two times a day (BID) | ORAL | Status: DC | PRN
Start: 2015-09-08 — End: 2015-09-11
  Administered 2015-09-09 – 2015-09-11 (×3): 5 mL via ORAL
  Filled 2015-09-08 (×4): qty 5

## 2015-09-08 MED ORDER — BENZONATATE 100 MG PO CAPS
200.0000 mg | ORAL_CAPSULE | ORAL | Status: DC | PRN
  Administered 2015-09-10 – 2015-09-11 (×2): 200 mg via ORAL
  Filled 2015-09-08 (×2): qty 2

## 2015-09-08 MED ORDER — FUROSEMIDE 10 MG/ML IJ SOLN
20.0000 mg | Freq: Once | INTRAMUSCULAR | Status: AC
  Administered 2015-09-08: 20 mg via INTRAVENOUS
  Filled 2015-09-08 (×2): qty 2

## 2015-09-08 MED ORDER — MOMETASONE FURO-FORMOTEROL FUM 200-5 MCG/ACT IN AERO
2.0000 | INHALATION_SPRAY | Freq: Two times a day (BID) | RESPIRATORY_TRACT | Status: DC
  Administered 2015-09-08 – 2015-09-11 (×6): 2 via RESPIRATORY_TRACT
  Filled 2015-09-08 (×2): qty 8.8

## 2015-09-08 MED ORDER — VANCOMYCIN HCL 10 G IV SOLR
1250.0000 mg | Freq: Once | INTRAVENOUS | Status: AC
  Administered 2015-09-08: 1250 mg via INTRAVENOUS
  Filled 2015-09-08: qty 1250

## 2015-09-08 MED ORDER — METHYLPREDNISOLONE SODIUM SUCC 125 MG IJ SOLR: 60.0000 mg | INTRAMUSCULAR | Status: DC

## 2015-09-08 MED ORDER — LEVOTHYROXINE SODIUM 75 MCG PO TABS
75.0000 ug | ORAL_TABLET | Freq: Every day | ORAL | Status: DC
  Administered 2015-09-09 – 2015-09-11 (×3): 75 ug via ORAL
  Filled 2015-09-08 (×4): qty 1

## 2015-09-08 MED ORDER — MONTELUKAST SODIUM 10 MG PO TABS
10.0000 mg | ORAL_TABLET | Freq: Every day | ORAL | Status: DC
  Administered 2015-09-08 – 2015-09-10 (×3): 10 mg via ORAL
  Filled 2015-09-08 (×3): qty 1

## 2015-09-08 MED ORDER — METHYLPREDNISOLONE SODIUM SUCC 125 MG IJ SOLR
125.0000 mg | Freq: Once | INTRAMUSCULAR | Status: AC
  Administered 2015-09-08: 125 mg via INTRAVENOUS
  Filled 2015-09-08: qty 2

## 2015-09-08 MED ORDER — RIVAROXABAN 20 MG PO TABS
20.0000 mg | ORAL_TABLET | Freq: Every day | ORAL | Status: DC
  Administered 2015-09-08 – 2015-09-10 (×3): 20 mg via ORAL
  Filled 2015-09-08 (×3): qty 1

## 2015-09-08 MED ORDER — ACETAMINOPHEN 325 MG PO TABS
650.0000 mg | ORAL_TABLET | Freq: Once | ORAL | Status: AC
  Administered 2015-09-08: 650 mg via ORAL
  Filled 2015-09-08: qty 2

## 2015-09-08 MED ORDER — TORSEMIDE 20 MG PO TABS
20.0000 mg | ORAL_TABLET | Freq: Every day | ORAL | Status: DC
  Administered 2015-09-09 – 2015-09-11 (×3): 20 mg via ORAL
  Filled 2015-09-08 (×3): qty 1

## 2015-09-08 MED ORDER — LEVOFLOXACIN IN D5W 750 MG/150ML IV SOLN
750.0000 mg | INTRAVENOUS | Status: DC
  Administered 2015-09-08: 750 mg via INTRAVENOUS
  Filled 2015-09-08: qty 150

## 2015-09-08 MED ORDER — ACETAMINOPHEN 325 MG PO TABS: 650.0000 mg | ORAL_TABLET | Freq: Four times a day (QID) | ORAL | Status: DC | PRN

## 2015-09-08 MED ORDER — TIOTROPIUM BROMIDE MONOHYDRATE 18 MCG IN CAPS
18.0000 ug | ORAL_CAPSULE | Freq: Every day | RESPIRATORY_TRACT | Status: DC
  Administered 2015-09-09 – 2015-09-11 (×3): 18 ug via RESPIRATORY_TRACT
  Filled 2015-09-08: qty 5

## 2015-09-08 MED ORDER — ONDANSETRON HCL 4 MG/2ML IJ SOLN: 4.0000 mg | Freq: Four times a day (QID) | INTRAMUSCULAR | Status: DC | PRN

## 2015-09-08 MED ORDER — VANCOMYCIN HCL 10 G IV SOLR
1250.0000 mg | INTRAVENOUS | Status: DC
  Administered 2015-09-09: 1250 mg via INTRAVENOUS
  Filled 2015-09-08: qty 1250

## 2015-09-08 MED ORDER — POTASSIUM CHLORIDE IN NACL 20-0.9 MEQ/L-% IV SOLN: INTRAVENOUS | Status: DC

## 2015-09-08 MED ORDER — IPRATROPIUM BROMIDE 0.02 % IN SOLN
0.5000 mg | Freq: Four times a day (QID) | RESPIRATORY_TRACT | Status: DC
  Administered 2015-09-08 – 2015-09-10 (×8): 0.5 mg via RESPIRATORY_TRACT
  Filled 2015-09-08 (×8): qty 2.5

## 2015-09-08 MED ORDER — SODIUM CHLORIDE 0.9% FLUSH
3.0000 mL | Freq: Two times a day (BID) | INTRAVENOUS | Status: DC
  Administered 2015-09-08 – 2015-09-11 (×6): 3 mL via INTRAVENOUS

## 2015-09-08 MED ORDER — CETYLPYRIDINIUM CHLORIDE 0.05 % MT LIQD
7.0000 mL | Freq: Two times a day (BID) | OROMUCOSAL | Status: DC
  Administered 2015-09-08 – 2015-09-11 (×4): 7 mL via OROMUCOSAL

## 2015-09-08 MED ORDER — VANCOMYCIN HCL IN DEXTROSE 1-5 GM/200ML-% IV SOLN: 1000.0000 mg | Freq: Once | INTRAVENOUS | Status: DC

## 2015-09-08 MED ORDER — FEBUXOSTAT 40 MG PO TABS
40.0000 mg | ORAL_TABLET | Freq: Every day | ORAL | Status: DC
  Administered 2015-09-09 – 2015-09-11 (×3): 40 mg via ORAL
  Filled 2015-09-08 (×4): qty 1

## 2015-09-08 MED ORDER — IPRATROPIUM-ALBUTEROL 0.5-2.5 (3) MG/3ML IN SOLN
3.0000 mL | Freq: Once | RESPIRATORY_TRACT | Status: AC
  Administered 2015-09-08: 3 mL via RESPIRATORY_TRACT
  Filled 2015-09-08: qty 3

## 2015-09-08 MED ORDER — ACETAMINOPHEN 650 MG RE SUPP: 650.0000 mg | Freq: Four times a day (QID) | RECTAL | Status: DC | PRN

## 2015-09-08 NOTE — Progress Notes (Signed)
Pharmacy Antibiotic Note  Kathleen [name] is a 70 y.o. female admitted on 09/08/2015 with sepsis.  Pharmacy has been consulted for Vancomycin dosing. Patient was treated for influenza about 3 weeks ago then treated for pneumonia with Levaquin.   Ke=0.059 T1/2=12hr Vd=48L  Plan: Vancomycin 1250 IV every 18 hours.  Goal trough 15-20 mcg/mL.  Height: 5\' 2"  (157.5 cm) Weight: 210 lb 14.4 oz (95.664 kg) IBW/kg (Calculated) : 50.1  Temp (24hrs), Avg:99.9 F (37.7 C), Min:98.4 F (36.9 C), Max:102.7 F (39.3 C)   Recent Labs Lab 09/08/15 1720  WBC 6.2  CREATININE 0.88    Estimated Creatinine Clearance: 66 mL/min (by C-G formula based on Cr of 0.88).    Allergies  Allergen Reactions  . Allopurinol     Other reaction(s): Dizziness  . Dronedarone Rash  . Clindamycin Hives    Throat swelling  . Meperidine Nausea And Vomiting  . Penicillins Hives    Has patient had a PCN reaction causing immediate rash, facial/tongue/throat swelling, SOB or lightheadedness with hypotension: Yes Has patient had a PCN reaction causing severe rash involving mucus membranes or skin necrosis: No Has patient had a PCN reaction that required hospitalization No Has patient had a PCN reaction occurring within the last 10 years: No If all of the above answers are "NO", then may proceed with Cephalosporin use.  . Rosuvastatin Other (See Comments)  . Tetracycline Hives  . Lovastatin Rash  . Other Rash    Prolonged tape adhesive causes rash   . Pneumococcal 13-Val Conj Vacc Itching, Rash and Swelling    Local redness, itching, firm warm at injection sight.    Antimicrobials this admission: Vancomycin 4/2 >>  Levaquin 4/2 x 1 Azithromycin 4/3 >>  Dose adjustments this admission:  Microbiology results:  Thank you for allowing pharmacy to be a part of this patient's care.  Clovia Cuff, PharmD, BCPS 09/08/2015 9:12 PM

## 2015-09-08 NOTE — Progress Notes (Signed)
ekg performed and given to patient's RN.

## 2015-09-08 NOTE — ED Notes (Signed)
Pt arrived by EMS from home with c/o increased SOB. Pt states she has been sick for 3 wks and went to her PCP for "possible flu". Pt has hx COPD and asthma.

## 2015-09-08 NOTE — H&P (Addendum)
[facility] -  at [facility]   PATIENT NAME: Kathleen [name]    MR#:  [id number]  DATE OF BIRTH:  1945/11/27  DATE OF ADMISSION:  09/08/2015  PRIMARY CARE PHYSICIAN: Dione Housekeeper, MD   REQUESTING/REFERRING PHYSICIAN:   CHIEF COMPLAINT:   Chief Complaint  Patient presents with  . Shortness of Breath    HISTORY OF PRESENT ILLNESS: Kathleen [name]  is a 70 y.o. female with a known history of COPD/BOOP, respiratory failure, on 2 L of oxygen through nasal cannula at night who presents to the hospital with complaints of shortness of breath, cough, yellow phlegm production, fevers. PERRLA. Patient was diagnosis flu approximately 3 weeks ago for which she was initiated on Tamiflu, she finished therapy. However, still did not feel well and because of concerns of secondary infection. She was initiated on levofloxacin and finished a course as well. Still over the past 3 weeks and especially over the past 2 days. She's been having worsening oral intake, generalized weakness, fevers and chills, weight loss, shortness of breath, wheezes, cough, chest pain, feeling presyncopal, with cough, intermittent nausea and decided to come to emergency room for further evaluation. In emergency room she had high fever to 102.7, labs showed hyponatremia of 134, thrombocytopenia 120, no leukocytosis. Chest x-ray revealed hazy perihilar be basilar opacification, concerning for interstitial edema or infection. Hospitalist services were contacted for admission.   PAST MEDICAL HISTORY:   Past Medical History  Diagnosis Date  . Atrial fibrillation (HCC)   . COPD (chronic obstructive pulmonary disease) (HCC)   . Asthma   . Pacemaker   . 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HCC)   . 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HCC)     PAST SURGICAL HISTORY: Past Surgical History  Procedure Laterality Date  . Breast biopsy Bilateral 1997     negative  . Pacemaker insertion    . Abdominal hysterectomy    . Cholecystectomy    . Hernia repair    . Cardiac catheterization      SOCIAL HISTORY:  Social History  Substance Use Topics  . Smoking status: Former Games developer  . Smokeless tobacco: Never Used  . Alcohol Use: No    FAMILY HISTORY:  Family History  Problem Relation Age of Onset  . Breast cancer Mother 78    3 different times  . Breast cancer Cousin     DRUG ALLERGIES:  Allergies  Allergen Reactions  . Dronedarone Rash  . Clindamycin Hives    Throat swelling  . Meperidine Nausea And Vomiting  . Penicillins Hives  . Rosuvastatin Other (See Comments)  . Tetracycline Hives  . Lovastatin Rash  . Other Rash    Prolonged tape adhesive causes rash   . Pneumococcal 13-Val Conj Vacc Itching, Rash and Swelling    Local redness, itching, firm warm at injection sight.    Review of Systems  Constitutional: Positive for fever, chills, weight loss, malaise/fatigue and diaphoresis.  HENT: Positive for congestion.   Eyes: Negative for blurred vision and double vision.  Respiratory: Positive for cough, sputum production, shortness of breath and wheezing.   Cardiovascular: Positive for chest pain. Negative for palpitations, orthopnea, leg swelling and PND.  Gastrointestinal: Positive for nausea and diarrhea. Negative for vomiting, abdominal pain, constipation, blood in stool and melena.  Genitourinary: Negative for dysuria, urgency, frequency and hematuria.  Musculoskeletal: Positive for joint pain. Negative for falls.  Skin: Negative for rash.  Neurological: Positive for weakness. Negative for dizziness.  Psychiatric/Behavioral: Negative for depression and memory loss. The patient is not nervous/anxious.     MEDICATIONS AT HOME:  Prior to Admission medications   Medication Sig Start Date End Date Taking? Authorizing Provider  chlorpheniramine-HYDROcodone (TUSSIONEX) 10-8 MG/5ML SUER Take 5 mLs by mouth every 12  (twelve) hours as needed. For cough. 09/04/15  Yes Historical Provider, MD  levothyroxine (SYNTHROID, LEVOTHROID) 75 MCG tablet Take 75 mcg by mouth daily. 07/05/15  Yes Historical Provider, MD  SPIRIVA HANDIHALER 18 MCG inhalation capsule Place 18 mcg into inhaler and inhale daily. 06/04/15  Yes Historical Provider, MD  spironolactone (ALDACTONE) 50 MG tablet Take 75 mg by mouth daily. 06/21/15  Yes Historical Provider, MD  SYMBICORT 160-4.5 MCG/ACT inhaler Inhale 2 puffs into the lungs 2 (two) times daily. 06/04/15  Yes Historical Provider, MD  ULORIC 40 MG tablet Take 40 mg by mouth daily. 09/03/15  Yes Historical Provider, MD  albuterol (PROAIR HFA) 108 (90 BASE) MCG/ACT inhaler Inhale into the lungs. 11/28/12   Historical Provider, MD  benzonatate (TESSALON) 200 MG capsule Take by mouth. 03/05/14   Historical Provider, MD  colchicine 0.6 MG tablet Take 1 tablet (0.6 mg total) by mouth daily. Take 2 tablets first day, 1 hour later take 1 more tab for a total of 3 tabs on the 1st day Take 1 tab daily for at least 6 more days. If  Symptoms persist past 6 days continue to use until prescription is finished 07/19/15   Delorise Royals Cuthriell, PA-C  cyclobenzaprine (FLEXERIL) 10 MG tablet Take by mouth.    Historical Provider, MD  fluticasone (FLONASE) 50 MCG/ACT nasal spray Place into the nose. 11/08/13   Historical Provider, MD  fluticasone (FLOVENT HFA) 110 MCG/ACT inhaler Inhale into the lungs. 10/26/14 10/26/15  Historical Provider, MD  montelukast (SINGULAIR) 10 MG tablet Take by mouth. 06/28/14 06/28/15  Historical Provider, MD  omeprazole (PRILOSEC) 20 MG capsule Take by mouth. 03/19/14 03/19/15  Historical Provider, MD  oxyCODONE-acetaminophen (ROXICET) 5-325 MG tablet Take 1 tablet by mouth every 6 (six) hours as needed. 06/23/15   [name], MD  rivaroxaban (XARELTO) 20 MG TABS tablet Take by mouth. 04/11/14   Historical Provider, MD  senna-docusate (SENOKOT S) 8.6-50 MG per tablet Take by mouth.     Historical Provider, MD  torsemide (DEMADEX) 20 MG tablet TAKE 2 TABLETS BY MOUTH ONCE DAILY 10/09/14   Historical Provider, MD      PHYSICAL EXAMINATION:   VITAL SIGNS: Blood pressure 140/64, pulse 79, temperature 102.7 F (39.3 C), temperature source Oral, resp. rate 25, SpO2 94 %.  GENERAL:  70 y.o.-year-old patient lying in the bed in moderate to severe respiratory distress. Diaphoretic and uncomfortable, distraught EYES: Pupils equal, round, reactive to light and accommodation. No scleral icterus. Extraocular muscles intact.  HEENT: Head atraumatic, normocephalic. Oropharynx and nasopharynx clear.  NECK:  Supple, no jugular venous distention. No thyroid enlargement, no tenderness.  LUNGS: Some diminished breath sounds bilaterally, scattered wheezing, copious bilateral rales,rhonchi and crepitations in all lung fields. Coughing intermittently, dry cough, using accessory muscles of respiration.  CARDIOVASCULAR: S1, S2 normal. No murmurs, rubs, or gallops.  ABDOMEN: Soft, nontender, nondistended. Bowel sounds present. No organomegaly or mass.  EXTREMITIES: No pedal edema, cyanosis, or clubbing.  NEUROLOGIC: Cranial nerves II through XII are intact. Muscle strength 5/5 in all extremities. Sensation intact. Gait not checked.  PSYCHIATRIC: The patient is alert and oriented x 3.  SKIN: No obvious rash, lesion, or ulcer.   LABORATORY PANEL:  CBC  Recent Labs Lab 09/08/15 1720  WBC 6.2  HGB 14.1  HCT 41.2  PLT 120*  MCV 83.8  MCH 28.7  MCHC 34.2  RDW 16.3*  LYMPHSABS 0.8*  MONOABS 0.5  EOSABS 0.0  BASOSABS 0.0   ------------------------------------------------------------------------------------------------------------------  Chemistries   Recent Labs Lab 09/08/15 1720  NA 134*  K 3.6  CL 100*  CO2 24  GLUCOSE 168*  BUN 10  CREATININE 0.88  CALCIUM 8.8*  AST 19  ALT 12*  ALKPHOS 54  BILITOT 1.7*    ------------------------------------------------------------------------------------------------------------------  Cardiac Enzymes  Recent Labs Lab 09/08/15 1720  TROPONINI <0.03   ------------------------------------------------------------------------------------------------------------------  RADIOLOGY: Dg Chest Port 1 View  09/08/2015  CLINICAL DATA:  Shortness of breath. Sick for 3 weeks with possible influenza. EXAM: PORTABLE CHEST 1 VIEW COMPARISON:  06/23/2015 and CT 06/23/2015 FINDINGS: Left-sided pacemaker unchanged. Lungs are adequately inflated and demonstrate bilateral perihilar/ bibasilar opacification suggesting interstitial edema although cannot exclude infection. Possible small left effusion. Mild stable cardiomegaly. Minimal calcified plaque over the aortic arch. Remainder of the exam is unchanged. IMPRESSION: Hazy bilateral perihilar bibasilar opacification likely interstitial edema, although cannot exclude infection. Possible small amount of left pleural fluid. Electronically Signed   By: Elberta Fortis M.D.   On: 09/08/2015 17:18    EKG: Orders placed or performed during the hospital encounter of 09/08/15  . ED EKG  . ED EKG  Atrial flutter, rate about 70 -80 bpm, nonspecific ST-T changes  IMPRESSION AND PLAN:  Active Problems:   Acute respiratory failure with hypoxia (HCC)   Fever   Bacterial pneumonia   Hyponatremia   Acute on chronic respiratory failure with hypoxia (HCC) #1. Acute on chronic respiratory failure with hypoxia, admit patient to medical floor, continue oxygen therapy, wean as tolerated #2. Suspected bacterial pneumonia,  initiate patient on vancomycin and Zithromax, get sputum cultures, adjust antibiotic depending on culture results #3. Fever, influenza test was negative, likely bacterial pneumonia related, continue watching fever curve her with antibiotic therapy #4. Hyponatremia, Patient will be given 1 dose of Lasix intravenously, follow  sodium level in the morning #5. Thrombocytopenia, get pro time INR, follow patient's platelet count in the morning. Patient did have some confusion earlier today, follow patient's mental status, get manual differential in mental status deteriorates and platelet count does not recover with therapy to rule out TTP/HUS #6. Atrial flutter with known history of atrial fibrillation, chronic, continue outpatient medications  All the records are reviewed and case discussed with ED provider. Management plans discussed with the patient, family and they are in agreement.  CODE STATUS: Code Status History    This patient does not have a recorded code status. Please follow your organizational policy for patients in this situation.       TOTAL Critical care TIME TAKING CARE OF THIS PATIENT: 60 minutes.    Katharina Caper M.D on 09/08/2015 at 7:06 PM  Between 7am to 6pm - Pager - [phone] After 6pm go to [email or web address] - password [email or web address] [facility]  [facility]  Office  [phone]  CC: Primary care physician; Dione Housekeeper, MD

## 2015-09-08 NOTE — ED Provider Notes (Signed)
Time Seen: Approximately 1700 I have reviewed the triage notes  Chief Complaint: Shortness of Breath   History of Present Illness: Kathleen [name] is a 70 y.o. female who has a history of COPD. Patient's on home oxygen therapy supplemental at night at 2 L as needed. Patient states that she's been feeling bad now for the last 3 weeks and was diagnosed with influenza 3 weeks ago. She states she finished a course of Tamiflu. She still had a persistent cough and was placed on a one-week history of Levaquin. She states she was also previously on steroids but is not on that and is on currently. She states that the shortness of breath is "" gotten worse recently "". She denies any chest pain or cardiac ischemic history. She does have a history of atrial fibrillation and a pacemaker. Patient denies any peripheral edema calf tenderness or swelling. She states that she had a fever yesterday and describes it as a temperature of 101. She denies any persistent nausea or vomiting. Room air saturations post breathing treatment by EMS here is 89%.  Past Medical History  Diagnosis Date  . Atrial fibrillation (HCC)   . COPD (chronic obstructive pulmonary disease) (HCC)   . Asthma   . Pacemaker   . 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HCC)   . 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HCC)     There are no active problems to display for this patient.   Past Surgical History  Procedure Laterality Date  . Breast biopsy Bilateral 1997    negative  . Pacemaker insertion    . Abdominal hysterectomy    . Cholecystectomy    . Hernia repair    . Cardiac catheterization      Past Surgical History  Procedure Laterality Date  . Breast biopsy Bilateral 1997    negative  . Pacemaker insertion    . Abdominal hysterectomy    . Cholecystectomy    . Hernia repair    . Cardiac catheterization      Current Outpatient Rx  Name  Route  Sig  Dispense  Refill  . albuterol  (PROAIR HFA) 108 (90 BASE) MCG/ACT inhaler   Inhalation   Inhale into the lungs.         . benzonatate (TESSALON) 200 MG capsule   Oral   Take by mouth.         . colchicine 0.6 MG tablet   Oral   Take 1 tablet (0.6 mg total) by mouth daily. Take 2 tablets first day, 1 hour later take 1 more tab for a total of 3 tabs on the 1st day Take 1 tab daily for at least 6 more days. If  Symptoms persist past 6 days continue to use until prescription is finished   20 tablet   0   . cyclobenzaprine (FLEXERIL) 10 MG tablet   Oral   Take by mouth.         . fluticasone (FLONASE) 50 MCG/ACT nasal spray   Nasal   Place into the nose.         . fluticasone (FLOVENT HFA) 110 MCG/ACT inhaler   Inhalation   Inhale into the lungs.         [name] EXPIRED: levothyroxine (SYNTHROID, LEVOTHROID) 75 MCG tablet   Oral   Take by mouth.         . EXPIRED: montelukast (SINGULAIR) 10 MG tablet   Oral   Take by mouth.         [name]  EXPIRED: omeprazole (PRILOSEC) 20 MG capsule   Oral   Take by mouth.         . oxyCODONE-acetaminophen (ROXICET) 5-325 MG tablet   Oral   Take 1 tablet by mouth every 6 (six) hours as needed.   20 tablet   0   . rivaroxaban (XARELTO) 20 MG TABS tablet   Oral   Take by mouth.         . senna-docusate (SENOKOT S) 8.6-50 MG per tablet   Oral   Take by mouth.         . EXPIRED: spironolactone (ALDACTONE) 25 MG tablet   Oral   Take by mouth.         . EXPIRED: tiotropium (SPIRIVA) 18 MCG inhalation capsule   Inhalation   Place into inhaler and inhale.         . torsemide (DEMADEX) 20 MG tablet      TAKE 2 TABLETS BY MOUTH ONCE DAILY           Allergies:  Dronedarone; Clindamycin; Meperidine; Penicillins; Rosuvastatin; Tetracycline; Lovastatin; Other; and Pneumococcal 13-val conj vacc  Family History: Family History  Problem Relation Age of Onset  . Breast cancer Mother 60    3 different times  . Breast cancer Cousin     Social  History: Social History  Substance Use Topics  . Smoking status: Former Games developer  . Smokeless tobacco: Never Used  . Alcohol Use: No     Review of Systems:   10 point review of systems was performed and was otherwise negative:  Constitutional: No fever Eyes: No visual disturbances ENT: No sore throat, ear pain Cardiac: No chest pain Respiratory: No shortness of breath, wheezing, or stridor Abdomen: No abdominal pain, no vomiting, No diarrhea Endocrine: No weight loss, No night sweats Extremities: No peripheral edema, cyanosis Skin: No rashes, easy bruising Neurologic: No focal weakness, trouble with speech or swollowing Urologic: No dysuria, Hematuria, or urinary frequency   Physical Exam:  ED Triage Vitals  Enc Vitals Group     BP --      Pulse --      Resp --      Temp --      Temp src --      SpO2 --      Weight --      Height --      Head Cir --      Peak Flow --      Pain Score --      Pain Loc --      Pain Edu? --      Excl. in GC? --     General: Awake , Alert , and Oriented times 3; GCS 15Patient speaks in interrupted sentences. No audible wheezing at the bedside Head: Normal cephalic , atraumatic Eyes: Pupils equal , round, reactive to light Nose/Throat: No nasal drainage, patent upper airway without erythema or exudate.  Neck: Supple, Full range of motion, No anterior adenopathy or palpable thyroid masses Lungs coarse breath sounds auscultated symmetrically in all lung fields with an expiratory wheezing heard throughout. Heart: Regular rate, regular rhythm without murmurs , gallops , or rubs Abdomen: Soft, non tender without rebound, guarding , or rigidity; bowel sounds positive and symmetric in all 4 quadrants. No organomegaly .        Extremities: 2 plus symmetric pulses. No edema, clubbing or cyanosis Neurologic: normal ambulation, Motor symmetric without deficits, sensory intact Skin: warm, dry, no rashes  Labs:   All laboratory work was  reviewed including any pertinent negatives or positives listed below:  Labs Reviewed  RAPID INFLUENZA A&B ANTIGENS ([facility] ONLY)  CBC WITH DIFFERENTIAL/PLATELET  COMPREHENSIVE METABOLIC PANEL  TROPONIN I  Laboratory work was reviewed and showed no clinically significant abnormalities.   EKG: ED ECG REPORT I, Jennye Moccasin, the attending physician, personally viewed and interpreted this ECG.  Date: 09/08/2015 EKG Time: 1714 Rate: 81 Rhythm: Atrial flutter 4:1 conduction QRS Axis: normal Intervals: Left bundle branch block pattern ST/T Wave abnormalities: normal Conduction Disturbances: none Narrative Interpretation: unremarkable    Radiology:       Final result by Rad Results In Interface (09/08/15 17:18:20)   Narrative:   CLINICAL DATA: Shortness of breath. Sick for 3 weeks with possible influenza.  EXAM: PORTABLE CHEST 1 VIEW  COMPARISON: 06/23/2015 and CT 06/23/2015  FINDINGS: Left-sided pacemaker unchanged. Lungs are adequately inflated and demonstrate bilateral perihilar/ bibasilar opacification suggesting interstitial edema although cannot exclude infection. Possible small left effusion. Mild stable cardiomegaly. Minimal calcified plaque over the aortic arch. Remainder of the exam is unchanged.  IMPRESSION: Hazy bilateral perihilar bibasilar opacification likely interstitial edema, although cannot exclude infection. Possible small amount of left pleural fluid.      * I personally reviewed the radiologic studies   P ED Course: Patient was kept on a 2 L nasal cannula was given not at her breathing treatment here in emergency department along with IV Solu-Medrol. Given her clinical presentation especially with her fever, etc. I felt this was community-acquired pneumonia. Patient has numerous allergies but does tolerate Levaquin was started on IV Levaquin. Blood cultures 2 are pending. Holding at 96% on the 2 L nasal cannula. Repeat exam still shows  persistent coarse breath sounds but some clearing of her wheezing  Assessment Community-acquired pneumonia History of COPD and BOOP     Plan: * Inpatient management            Jennye Moccasin, MD 09/08/15 [phone]

## 2015-09-09 LAB — COMPREHENSIVE METABOLIC PANEL
ALT: 12 U/L — AB (ref 14–54)
ANION GAP: 7 (ref 5–15)
AST: 15 U/L (ref 15–41)
Albumin: 3.4 g/dL — ABNORMAL LOW (ref 3.5–5.0)
Alkaline Phosphatase: 47 U/L (ref 38–126)
BUN: 15 mg/dL (ref 6–20)
CHLORIDE: 103 mmol/L (ref 101–111)
CO2: 24 mmol/L (ref 22–32)
Calcium: 8.8 mg/dL — ABNORMAL LOW (ref 8.9–10.3)
Creatinine, Ser: 0.85 mg/dL (ref 0.44–1.00)
GFR calc non Af Amer: >60 mL/min (ref >60)
Glucose, Bld: 229 mg/dL — ABNORMAL HIGH (ref 65–99)
Potassium: 3 mmol/L — ABNORMAL LOW (ref 3.5–5.1)
SODIUM: 134 mmol/L — AB (ref 135–145)
Total Bilirubin: 1.1 mg/dL (ref 0.3–1.2)
Total Protein: 6.2 g/dL — ABNORMAL LOW (ref 6.5–8.1)

## 2015-09-09 LAB — URINALYSIS COMPLETE WITH MICROSCOPIC (ARMC ONLY)
Bilirubin Urine: NEGATIVE
Glucose, UA: 150 mg/dL — AB
KETONES UR: NEGATIVE mg/dL
LEUKOCYTES UA: NEGATIVE
NITRITE: NEGATIVE
PH: 5 (ref 5.0–8.0)
PROTEIN: NEGATIVE mg/dL
SPECIFIC GRAVITY, URINE: 1.008 (ref 1.005–1.030)

## 2015-09-09 LAB — TROPONIN I
Troponin I: <0.03 ng/mL (ref <0.031)
Troponin I: <0.03 ng/mL (ref <0.031)

## 2015-09-09 MED ORDER — DEXTROSE 5 % IV SOLN
1.0000 g | Freq: Every day | INTRAVENOUS | Status: DC
  Administered 2015-09-09 – 2015-09-11 (×3): 1 g via INTRAVENOUS
  Filled 2015-09-09 (×4): qty 10

## 2015-09-09 MED ORDER — NYSTATIN 100000 UNIT/ML MT SUSP
5.0000 mL | Freq: Four times a day (QID) | OROMUCOSAL | Status: DC
  Administered 2015-09-09 – 2015-09-11 (×7): 500000 [IU] via ORAL
  Filled 2015-09-09 (×7): qty 5

## 2015-09-09 MED ORDER — POTASSIUM CHLORIDE CRYS ER 20 MEQ PO TBCR
40.0000 meq | EXTENDED_RELEASE_TABLET | Freq: Once | ORAL | Status: AC
  Administered 2015-09-09: 40 meq via ORAL
  Filled 2015-09-09: qty 2

## 2015-09-09 NOTE — Progress Notes (Signed)
Initial Nutrition Assessment  DOCUMENTATION CODES:   Non-severe (moderate) malnutrition in context of acute illness/injury, Obesity unspecified  INTERVENTION:  -Monitor intake -Recommend mightyshake BID for added nutrition   NUTRITION DIAGNOSIS:   Inadequate oral intake related to acute illness as evidenced by per patient/family report, percent weight loss, energy intake < 75% for > 7 days.    GOAL:   Patient will meet greater than or equal to 90% of their needs    MONITOR:   PO intake, Supplement acceptance  REASON FOR ASSESSMENT:   Malnutrition Screening Tool    ASSESSMENT:   Pt admitted with acute respiratory failure with hypoxia, pneumonia, hyponatremia, thrombocytopenia. Recent diagnosis of the flu prior to admission. Past Medical History  Diagnosis Date  . Atrial fibrillation (HCC)   . COPD (chronic obstructive pulmonary disease) (HCC)   . Asthma   . Pacemaker   . 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HCC)   . 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HCC)     Pt reports eating few bites of eggs, 1/2 french toast this am for breakfast.   Reports over the past 3 weeks has been eating next to nothing, few bites at most secondary to not feeling well and having the flu.    Medications reviewed: solumedrol Labs reviewed: Na 134, K 3.0, Ca 8.8, glucose 229   Diet Order:  DIET SOFT Room service appropriate?: Yes; Fluid consistency:: Thin  Skin:  Reviewed, no issues  Last BM:  09/09/2015 loose (c-diff negative)  Height:   Ht Readings from Last 1 Encounters:  09/08/15 5\' 2"  (1.575 m)    Weight:   Wt Readings from Last 1 Encounters:  09/08/15 210 lb 14.4 oz (95.664 kg)    Ideal Body Weight:     BMI:  Body mass index is 38.56 kg/(m^2).  Estimated Nutritional Needs:   Kcal:  9518-8416 kcals/d.   Protein:  95-114 g/d  Fluid:  1.8-2.0 L/d  EDUCATION NEEDS:   No education needs identified at this  time  Kathleen [name], RD, LDN [phone] (pager) Weekend/On-Call pager [phone])

## 2015-09-09 NOTE — Care Management Note (Signed)
Case Management Note  Patient Details  Name: Kathleen [name] MRN: [id number] Date of Birth: 1945-11-10  Subjective/Objective:                 Patient admitted from home with Acute respiratory failure with hypoxia.  Patient lives at home alone. Patient [facility] her [facility] from [facility].  Patient denies any difficulty obtaining medications.  Patient has chronic oO2 provided by [employer].  I have confirmed with [employer] that patient's O2 is ordered continuous at home, 2 liters at rest, and 3 with exertion.  Patient has a CPAP at home, and also has a cane that she uses PRN.  Patient to bring portable tank for discharge.    Action/Plan:   Expected Discharge Date:                  Expected Discharge Plan:     In-House Referral:     Discharge planning Services     Post Acute Care Choice:    Choice offered to:     DME Arranged:    DME Agency:     HH Arranged:    HH Agency:     Status of Service:     Medicare Important Message Given:    Date Medicare IM Given:    Medicare IM give by:    Date Additional Medicare IM Given:    Additional Medicare Important Message give by:     If discussed at Long Length of Stay Meetings, dates discussed:    Additional Comments:  Chapman Fitch, RN 09/09/2015, 12:21 PM

## 2015-09-09 NOTE — Progress Notes (Signed)
Patient ID: Kathleen [name], female   DOB: 25-Apr-1946, 70 y.o.   MRN: [id number] [facility] PROGRESS NOTE  ADDIELYNN KLEINBERG CBU:[id number] DOB: Nov 16, 1945 DOA: 09/08/2015 PCP: Dione Housekeeper, MD  HPI/Subjective: Patient with cough and shortness of breath and wheezing. She was on outpatient Levaquin for 1 week. Feeling worse. She did have a productive cough but now unproductive. She did not sleep last night and was pretty jittery.  Objective: Filed Vitals:   09/08/15 2014 09/09/15 0434  BP: 129/72 132/70  Pulse: 79 80  Temp: 98.5 F (36.9 C) 97.9 F (36.6 C)  Resp: 20 22    Filed Weights   09/08/15 2014  Weight: 95.664 kg (210 lb 14.4 oz)    ROS: Review of Systems  Constitutional: Negative for fever and chills.  Eyes: Negative for blurred vision.  Respiratory: Positive for cough, shortness of breath and wheezing.   Cardiovascular: Negative for chest pain.  Gastrointestinal: Negative for nausea, vomiting, abdominal pain, diarrhea and constipation.  Genitourinary: Negative for dysuria.  Musculoskeletal: Negative for joint pain.  Neurological: Negative for dizziness and headaches.   Exam: Physical Exam  Constitutional: She is oriented to person, place, and time.  HENT:  Nose: No mucosal edema.  Mouth/Throat: No oropharyngeal exudate or posterior oropharyngeal edema.  Thrush in the mouth  Eyes: Conjunctivae, EOM and lids are normal. Pupils are equal, round, and reactive to light.  Neck: No JVD present. Carotid bruit is not present. No edema present. No thyroid mass and no thyromegaly present.  Cardiovascular: S1 normal and S2 normal.  Exam reveals no gallop.   No murmur heard. Pulses:      Dorsalis pedis pulses are 2+ on the right side, and 2+ on the left side.  Respiratory: No respiratory distress. She has decreased breath sounds in the right middle field, the right lower field, the left middle field and the left lower field. She has wheezes in the right  middle field, the right lower field, the left middle field and the left lower field. She has no rhonchi. She has no rales.  GI: Soft. Bowel sounds are normal. There is no tenderness.  Musculoskeletal:       Right ankle: She exhibits swelling.       Left ankle: She exhibits swelling.  Lymphadenopathy:    She has no cervical adenopathy.  Neurological: She is alert and oriented to person, place, and time. No cranial nerve deficit.  Skin: Skin is warm. No rash noted. Nails show no clubbing.  Psychiatric: She has a normal mood and affect.      Data Reviewed: Basic Metabolic Panel:  Recent Labs Lab 09/08/15 1720 09/09/15 0526  NA 134* 134*  K 3.6 3.0*  CL 100* 103  CO2 24 24  GLUCOSE 168* 229*  BUN 10 15  CREATININE 0.88 0.85  CALCIUM 8.8* 8.8*   Liver Function Tests:  Recent Labs Lab 09/08/15 1720 09/09/15 0526  AST 19 15  ALT 12* 12*  ALKPHOS 54 47  BILITOT 1.7* 1.1  PROT 6.8 6.2*  ALBUMIN 3.7 3.4*   CBC:  Recent Labs Lab 09/08/15 1720  WBC 6.2  NEUTROABS 4.9  HGB 14.1  HCT 41.2  MCV 83.8  PLT 120*   Cardiac Enzymes:  Recent Labs Lab 09/08/15 1720 09/08/15 2336 09/09/15 0522 09/09/15 1104  TROPONINI <0.03 <0.03 <0.03 <0.03   BNP (last 3 results)  Recent Labs  09/08/15 1721  BNP 100.0    Recent Results (from the past 240  hour(s))  Rapid Influenza A&B Antigens ([facility] only)     Status: None   Collection Time: 09/08/15  5:21 PM  Result Value Ref Range Status   Influenza A [facility]) NEGATIVE NEGATIVE Final   Influenza B [facility]) NEGATIVE NEGATIVE Final     Studies: Dg Chest Port 1 View  09/08/2015  CLINICAL DATA:  Shortness of breath. Sick for 3 weeks with possible influenza. EXAM: PORTABLE CHEST 1 VIEW COMPARISON:  06/23/2015 and CT 06/23/2015 FINDINGS: Left-sided pacemaker unchanged. Lungs are adequately inflated and demonstrate bilateral perihilar/ bibasilar opacification suggesting interstitial edema although cannot exclude infection. Possible  small left effusion. Mild stable cardiomegaly. Minimal calcified plaque over the aortic arch. Remainder of the exam is unchanged. IMPRESSION: Hazy bilateral perihilar bibasilar opacification likely interstitial edema, although cannot exclude infection. Possible small amount of left pleural fluid. Electronically Signed   By: Elberta Fortis M.D.   On: 09/08/2015 17:18    Scheduled Meds: . albuterol  2.5 mg Nebulization Q6H  . antiseptic oral rinse  7 mL Mouth Rinse BID  . azithromycin  500 mg Intravenous Q24H  . cefTRIAXone (ROCEPHIN)  IV  1 g Intravenous Daily  . febuxostat  40 mg Oral Daily  . fluticasone  2 spray Each Nare Daily  . guaiFENesin  600 mg Oral BID  . ipratropium  0.5 mg Nebulization Q6H  . levothyroxine  75 mcg Oral QAC breakfast  . methylPREDNISolone (SOLU-MEDROL) injection  60 mg Intravenous Q24H  . mometasone-formoterol  2 puff Inhalation BID  . montelukast  10 mg Oral QHS  . nystatin  5 mL Oral QID  . potassium chloride  40 mEq Oral Once  . rivaroxaban  20 mg Oral QHS  . sodium chloride flush  3 mL Intravenous Q12H  . spironolactone  75 mg Oral Daily  . tiotropium  18 mcg Inhalation Daily  . torsemide  20 mg Oral Daily   Assessment/Plan:  1. Acute hypoxic respiratory failure. Patient only wears oxygen at night. Now requiring it during the day. Pulse ox 89% on room air 2. Bilateral pneumonia and failed outpatient treatment. Patient was also influenza positive a as outpatient. Wasn't feeling well and had a week of Levaquin as outpatient. Curious choice of antibiotic on presentation with vancomycin and Zithromax. I will get rid of the vancomycin and add Rocephin. 3. COPD exacerbation continue Solu-Medrol daily and nebulizer treatments 4. Thrush nystatin swish and swallow 5. History of atrial fibrillation anticoagulated with Xarelto and rate controlled 6. Hypothyroidism unspecified on levothyroxine 7. Hypokalemia potassium replacement  Code Status:     Code Status  Orders        Start     Ordered   09/08/15 2032  Full code   Continuous     09/08/15 2032    Code Status History    Date Active Date Inactive Code Status Order ID Comments User Context   This patient has a current code status but no historical code status.     Disposition Plan: Home once breathing better  Antibiotics:  Rocephin  Zithromax  Time spent: 25 minutes  Alford Highland  [facility]

## 2015-09-09 NOTE — Evaluation (Signed)
Physical Therapy Evaluation Patient Details Name: Kathleen [name] MRN: [id number] DOB: 1946/01/15 Today's Date: 09/09/2015   History of Present Illness  Pt is a 70 y.o. female presenting with SOB, cough, and wheezing and admitted with acute on chronic respiratory failure with hypoxia and B PNA.  PMH includes COPD/BOOP, respiratory failure, on 2 L O2 [place] at night, a-fib, pacemaker.    Clinical Impression  Prior to admission, pt was independent (used SPC in community/outside and used 2 L O2 at night via nasal cannula).  Pt lives alone in 1 level home with stairs to enter.  Currently pt is modified independent with bed mobility, SBA with transfers, and CGA with short ambulation in room no AD (pt occasionally reaching for items in room to steady herself and distance limited d/t SOB and wheezing; O2 >93% on 3 L/min O2 via nasal cannula during session).  Pt would benefit from skilled PT to address noted impairments and functional limitations.  Recommend pt discharge to home when medically appropriate.     Follow Up Recommendations No PT follow up    Equipment Recommendations       Recommendations for Other Services       Precautions / Restrictions Precautions Precautions: Fall Restrictions Weight Bearing Restrictions: No      Mobility  Bed Mobility Overal bed mobility: Modified Independent             General bed mobility comments: Supine to/from sit with HOB elevated  Transfers Overall transfer level: Needs assistance Equipment used: None Transfers: Sit to/from Stand Sit to Stand: Supervision         General transfer comment: steady without loss of balance  Ambulation/Gait Ambulation/Gait assistance: Min guard Ambulation Distance (Feet): 20 Feet (in room) Assistive device: None Gait Pattern/deviations: Step-through pattern     General Gait Details: pt occasionally reaching for items in room to steady (pt had declined use of RW); decreased cadence  Stairs             Wheelchair Mobility    Modified Rankin (Stroke Patients Only)       Balance Overall balance assessment: Needs assistance Sitting-balance support: No upper extremity supported;Feet supported Sitting balance-Leahy Scale: Normal     Standing balance support: During functional activity Standing balance-Leahy Scale: Fair Standing balance comment: pt occasionally reaching for items to steady                             Pertinent Vitals/Pain Pain Assessment: No/denies pain  See flow sheet for details.    Home Living Family/patient expects to be discharged to:: Private residence Living Arrangements: Alone     Home Access: Stairs to enter Entrance Stairs-Rails: Left Entrance Stairs-Number of Steps: 3-4 tall steps Home Layout: One level Home Equipment: Cane - single point;Walker - 4 wheels      Prior Function Level of Independence: Independent with assistive device(s)         Comments: Uses SPC outside/in community.  Denies any h/o falls.     Hand Dominance        Extremity/Trunk Assessment   Upper Extremity Assessment: Generalized weakness           Lower Extremity Assessment: Generalized weakness         Communication   Communication: No difficulties  Cognition Arousal/Alertness: Awake/alert Behavior During Therapy: WFL for tasks assessed/performed Overall Cognitive Status: Within Functional Limits for tasks assessed  General Comments   Nursing cleared pt for participation in physical therapy.  Pt agreeable to PT session.    Exercises        Assessment/Plan    PT Assessment Patient needs continued PT services  PT Diagnosis Generalized weakness   PT Problem List Decreased activity tolerance;Cardiopulmonary status limiting activity;Decreased balance;Decreased mobility  PT Treatment Interventions DME instruction;Gait training;Stair training;Functional mobility training;Therapeutic  activities;Therapeutic exercise;Balance training;Patient/family education   PT Goals (Current goals can be found in the Care Plan section) Acute Rehab PT Goals Patient Stated Goal: to go home PT Goal Formulation: With patient Time For Goal Achievement: 09/23/15 Potential to Achieve Goals: Good    Frequency Min 2X/week   Barriers to discharge        Co-evaluation               End of Session Equipment Utilized During Treatment: Gait belt;Oxygen (3 L/min via nasal cannula) Activity Tolerance: Patient limited by fatigue (Limited d/t SOB and wheezing) Patient left: in bed;with call bell/phone within reach (nursing reporting pt did not need bed alarm) Nurse Communication: Mobility status (Vitals during session)         Time: 0454-0981 PT Time Calculation (min) (ACUTE ONLY): 18 min   Charges:   PT Evaluation $PT Eval Low Complexity: 1 Procedure     PT G CodesHendricks Limes 29-Sep-2015, 4:23 PM Hendricks Limes, PT [phone]

## 2015-09-09 NOTE — Progress Notes (Signed)
Inpatient Diabetes Program Recommendations  AACE/ADA: New Consensus Statement on Inpatient Glycemic Control (2015)  Target Ranges:  Prepandial:   less than 140 mg/dL      Peak postprandial:   less than 180 mg/dL (1-2 hours)      Critically ill patients:  140 - 180 mg/dL   Review of Glycemic Control  Results for Kathleen [name], Kathleen [name] (MRN [id number]) as of 09/09/2015 13:45  Ref. Range 09/08/2015 17:20 09/08/2015 17:21 09/08/2015 17:21 09/08/2015 23:27 09/08/2015 23:36 09/09/2015 05:22 09/09/2015 05:26 09/09/2015 11:04  Glucose Latest Ref Range: 65-99 mg/dL 678 (H)      938 (H)     Diabetes history: none Outpatient Diabetes medications: none Current orders for Inpatient glycemic control: none - steroids q24hours  Inpatient Diabetes Program Recommendations: Please consider checking an A1C and blood sugars tid and hs.   Consider carb modified/heart healthy diet  Susette Racer, RN, [place], [place], CDE Diabetes Coordinator Inpatient Diabetes Program  [phone] (Team Pager) [phone] [facility] Office) 09/09/2015 1:46 PM

## 2015-09-09 NOTE — Progress Notes (Signed)
2300- Notified Dr. Loney Loh of: Cardiopulmonary's request to notify of EKG's "unspecified pacemaker failure" message; patient's orange urine and that patient stated that Dr. Seth Bake in ED, told her that she was going to change her IVF and that there were no new orders for IVFs;  Dr. Loney Loh acknowledged. Windy Carina, RN 09/09/2015

## 2015-09-10 LAB — ROCKY MTN SPOTTED FVR ABS PNL(IGG+IGM)
RMSF IGG: NEGATIVE
RMSF IGM: 0.22 {index} (ref 0.00–0.89)

## 2015-09-10 LAB — BASIC METABOLIC PANEL
Anion gap: 10 (ref 5–15)
BUN: 20 mg/dL (ref 6–20)
CHLORIDE: 100 mmol/L — AB (ref 101–111)
CO2: 24 mmol/L (ref 22–32)
CREATININE: 0.89 mg/dL (ref 0.44–1.00)
Calcium: 9.3 mg/dL (ref 8.9–10.3)
GFR calc non Af Amer: >60 mL/min (ref >60)
Glucose, Bld: 255 mg/dL — ABNORMAL HIGH (ref 65–99)
Potassium: 3.9 mmol/L (ref 3.5–5.1)
SODIUM: 134 mmol/L — AB (ref 135–145)

## 2015-09-10 LAB — MAGNESIUM: MAGNESIUM: 1.9 mg/dL (ref 1.7–2.4)

## 2015-09-10 LAB — EHRLICHIA ANTIBODY PANEL
E CHAFFEENSIS AB, IGG: NEGATIVE
E chaffeensis (HGE) Ab, IgM: NEGATIVE
E. CHAFFEENSIS (HME) IGM TITER: NEGATIVE
E.Chaffeensis (HME) IgG: NEGATIVE

## 2015-09-10 LAB — GLUCOSE, CAPILLARY
GLUCOSE-CAPILLARY: 145 mg/dL — AB (ref 65–99)
Glucose-Capillary: 215 mg/dL — ABNORMAL HIGH (ref 65–99)

## 2015-09-10 MED ORDER — INSULIN ASPART 100 UNIT/ML ~~LOC~~ SOLN
0.0000 [IU] | Freq: Every day | SUBCUTANEOUS | Status: DC
  Administered 2015-09-10: 2 [IU] via SUBCUTANEOUS
  Filled 2015-09-10 (×2): qty 2

## 2015-09-10 MED ORDER — INSULIN ASPART 100 UNIT/ML ~~LOC~~ SOLN
0.0000 [IU] | Freq: Three times a day (TID) | SUBCUTANEOUS | Status: DC
  Administered 2015-09-10: 1 [IU] via SUBCUTANEOUS
  Administered 2015-09-11: 3 [IU] via SUBCUTANEOUS
  Administered 2015-09-11: 2 [IU] via SUBCUTANEOUS
  Filled 2015-09-10: qty 1
  Filled 2015-09-10: qty 2
  Filled 2015-09-10: qty 3

## 2015-09-10 NOTE — Care Management Important Message (Signed)
Important Message  Patient Details  Name: Kathleen [name] MRN: [id number] Date of Birth: Oct 22, 1945   Medicare Important Message Given:  Yes    Olegario Messier A Danity Schmelzer 09/10/2015, 10:15 AM

## 2015-09-10 NOTE — Progress Notes (Signed)
Inpatient Diabetes Program Recommendations  AACE/ADA: New Consensus Statement on Inpatient Glycemic Control (2015)  Target Ranges:  Prepandial:   less than 140 mg/dL      Peak postprandial:   less than 180 mg/dL (1-2 hours)      Critically ill patients:  140 - 180 mg/dL  Results for Kathleen [name], Kathleen [name] (MRN [id number]) as of 09/10/2015 09:44  Ref. Range 09/08/2015 17:20 09/09/2015 05:26 09/10/2015 04:03  Glucose Latest Ref Range: 65-99 mg/dL 681 (H) 275 (H) 170 (H)   Results for Kathleen [name], Kathleen [name] (MRN [id number]) as of 09/10/2015 09:44  Ref. Range 12/26/2012 06:29  Hemoglobin A1C Latest Ref Range: 4.2-6.3 % 6.6 (H)   Review of Glycemic Control  Diabetes history: No Outpatient Diabetes medications: NA Current orders for Inpatient glycemic control: None  Inpatient Diabetes Program Recommendations: Correction (SSI): While inpatient and ordered steroids, please consider ordering CBGs with Novolog correction scale. HgbA1C: Please consider ordering an A1C to evaluate glycemic control over the past 2-3 months. Diet: Please consider changing diet to Soft Carb Modified.  Thanks, Orlando Penner, RN, MSN, CDE Diabetes Coordinator Inpatient Diabetes Program [phone] (Team Pager from 8am to 5pm) [phone] (AP office) [phone] [facility] office) [phone] [facility] office)

## 2015-09-10 NOTE — Progress Notes (Signed)
Dr. Anne Hahn notified of pt c/o chest pain post abt infusion. Pt stated her chest started hurting after antibiotic ran at 5pm but subsided on its own after about 5 minutes. She did not report this to the nurse on prior shift. Md aware of symptoms post abt (nausea arm pain chest pain.) he doesn't think this is a true allergy. He suggested running abt at slower rate to see if pt tolerates better on next dose. Will notify day shift nurse.

## 2015-09-10 NOTE — Progress Notes (Signed)
Patient ID: Kathleen [name], female   DOB: 17-May-1946, 70 y.o.   MRN: [id number] [facility] PROGRESS NOTE  CARMA DWIGGINS WUJ:[id number] DOB: 21-Jan-1946 DOA: 09/08/2015 PCP: Dione Housekeeper, MD  HPI/Subjective: Patient still with shortness of breath with minimal activity. Still with cough and wheeze. Feels a little bit better than yesterday.  Objective: Filed Vitals:   09/10/15 0506 09/10/15 1322  BP: 122/68 120/60  Pulse: 81 88  Temp: 97.3 F (36.3 C) 97.7 F (36.5 C)  Resp: 20 19    Filed Weights   09/08/15 2014  Weight: 95.664 kg (210 lb 14.4 oz)    ROS: Review of Systems  Constitutional: Negative for fever and chills.  Eyes: Negative for blurred vision.  Respiratory: Positive for cough, shortness of breath and wheezing.   Cardiovascular: Negative for chest pain.  Gastrointestinal: Negative for nausea, vomiting, abdominal pain, diarrhea and constipation.  Genitourinary: Negative for dysuria.  Musculoskeletal: Negative for joint pain.  Neurological: Negative for dizziness and headaches.   Exam: Physical Exam  Constitutional: She is oriented to person, place, and time.  HENT:  Nose: No mucosal edema.  Mouth/Throat: No oropharyngeal exudate or posterior oropharyngeal edema.  Thrush in the mouth  Eyes: Conjunctivae, EOM and lids are normal. Pupils are equal, round, and reactive to light.  Neck: No JVD present. Carotid bruit is not present. No edema present. No thyroid mass and no thyromegaly present.  Cardiovascular: S1 normal and S2 normal.  Exam reveals no gallop.   No murmur heard. Pulses:      Dorsalis pedis pulses are 2+ on the right side, and 2+ on the left side.  Respiratory: No respiratory distress. She has decreased breath sounds in the right lower field and the left lower field. She has wheezes in the right middle field, the right lower field and the left lower field. She has no rhonchi. She has no rales.  GI: Soft. Bowel sounds are normal.  There is no tenderness.  Musculoskeletal:       Right ankle: She exhibits swelling.       Left ankle: She exhibits swelling.  Lymphadenopathy:    She has no cervical adenopathy.  Neurological: She is alert and oriented to person, place, and time. No cranial nerve deficit.  Skin: Skin is warm. No rash noted. Nails show no clubbing.  Psychiatric: She has a normal mood and affect.      Data Reviewed: Basic Metabolic Panel:  Recent Labs Lab 09/08/15 1720 09/09/15 0526 09/10/15 0403  NA 134* 134* 134*  K 3.6 3.0* 3.9  CL 100* 103 100*  CO2 24 24 24   GLUCOSE 168* 229* 255*  BUN 10 15 20   CREATININE 0.88 0.85 0.89  CALCIUM 8.8* 8.8* 9.3  MG  --   --  1.9   Liver Function Tests:  Recent Labs Lab 09/08/15 1720 09/09/15 0526  AST 19 15  ALT 12* 12*  ALKPHOS 54 47  BILITOT 1.7* 1.1  PROT 6.8 6.2*  ALBUMIN 3.7 3.4*   CBC:  Recent Labs Lab 09/08/15 1720  WBC 6.2  NEUTROABS 4.9  HGB 14.1  HCT 41.2  MCV 83.8  PLT 120*   Cardiac Enzymes:  Recent Labs Lab 09/08/15 1720 09/08/15 2336 09/09/15 0522 09/09/15 1104  TROPONINI <0.03 <0.03 <0.03 <0.03   BNP (last 3 results)  Recent Labs  09/08/15 1721  BNP 100.0    Recent Results (from the past 240 hour(s))  Rapid Influenza A&B Antigens ([facility] only)  Status: None   Collection Time: 09/08/15  5:21 PM  Result Value Ref Range Status   Influenza A ([facility]) NEGATIVE NEGATIVE Final   Influenza B ([facility]) NEGATIVE NEGATIVE Final  Culture, blood (Routine X 2) w Reflex to ID Panel     Status: None (Preliminary result)   Collection Time: 09/08/15  5:21 PM  Result Value Ref Range Status   Specimen Description BLOOD LEFT ANTECUBITAL  Final   Special Requests BOTTLES DRAWN AEROBIC AND ANAEROBIC  5CC  Final   Culture NO GROWTH 2 DAYS  Final   Report Status PENDING  Incomplete  Culture, blood (Routine X 2) w Reflex to ID Panel     Status: None (Preliminary result)   Collection Time: 09/08/15  5:21 PM  Result Value Ref  Range Status   Specimen Description BLOOD RIGHT ANTECUBITAL  Final   Special Requests BOTTLES DRAWN AEROBIC AND ANAEROBIC  4CC  Final   Culture NO GROWTH 2 DAYS  Final   Report Status PENDING  Incomplete     Studies: Dg Chest Port 1 View  09/08/2015  CLINICAL DATA:  Shortness of breath. Sick for 3 weeks with possible influenza. EXAM: PORTABLE CHEST 1 VIEW COMPARISON:  06/23/2015 and CT 06/23/2015 FINDINGS: Left-sided pacemaker unchanged. Lungs are adequately inflated and demonstrate bilateral perihilar/ bibasilar opacification suggesting interstitial edema although cannot exclude infection. Possible small left effusion. Mild stable cardiomegaly. Minimal calcified plaque over the aortic arch. Remainder of the exam is unchanged. IMPRESSION: Hazy bilateral perihilar bibasilar opacification likely interstitial edema, although cannot exclude infection. Possible small amount of left pleural fluid. Electronically Signed   By: Elberta Fortis M.D.   On: 09/08/2015 17:18    Scheduled Meds: . albuterol  2.5 mg Nebulization Q6H  . antiseptic oral rinse  7 mL Mouth Rinse BID  . azithromycin  500 mg Intravenous Q24H  . cefTRIAXone (ROCEPHIN)  IV  1 g Intravenous Daily  . febuxostat  40 mg Oral Daily  . fluticasone  2 spray Each Nare Daily  . guaiFENesin  600 mg Oral BID  . ipratropium  0.5 mg Nebulization Q6H  . levothyroxine  75 mcg Oral QAC breakfast  . methylPREDNISolone (SOLU-MEDROL) injection  60 mg Intravenous Q24H  . mometasone-formoterol  2 puff Inhalation BID  . montelukast  10 mg Oral QHS  . nystatin  5 mL Oral QID  . rivaroxaban  20 mg Oral QHS  . sodium chloride flush  3 mL Intravenous Q12H  . spironolactone  75 mg Oral Daily  . tiotropium  18 mcg Inhalation Daily  . torsemide  20 mg Oral Daily   Assessment/Plan:  1. Acute hypoxic respiratory failure. Patient only wears oxygen at night. Now requiring it during the day. Pulse ox 89% on room air. Will check pulse ox tomorrow morning on  room air 2. Bilateral pneumonia and failed outpatient treatment. Patient was also influenza positive a as outpatient. Wasn't feeling well and had a week of Levaquin as outpatient. Rocephin and Zithromax while here 3. COPD exacerbation continue Solu-Medrol daily and nebulizer treatments 4. Thrush nystatin swish and swallow 5. History of atrial fibrillation anticoagulated with Xarelto and rate controlled 6. Hypothyroidism unspecified on levothyroxine 7. Hypokalemia potassium replacement 8. Discontinue telemetry 9. Weakness appreciated physical therapy evaluation  Code Status:     Code Status Orders        Start     Ordered   09/08/15 2032  Full code   Continuous     09/08/15 2032  Code Status History    Date Active Date Inactive Code Status Order ID Comments User Context   This patient has a current code status but no historical code status.     Disposition Plan: Home once breathing better  Antibiotics:  Rocephin  Zithromax  Time spent: 25 minutes  Alford Highland  [facility] Hospitalists

## 2015-09-10 NOTE — Progress Notes (Signed)
Pt alert and oriented. After running antibiotic azithromycin pt again c/o not feeling well, she stated she felt like she had to throw up and 'my left arm is sore'. The same arm which her IV was infusing. / The antibiotics was stopped, the IV site was flushed and the pt reported she started to feel better after stopping it.  Dr. Renae Gloss aware that pt poorly tolerated dose of antibiotic. Continue to assess.

## 2015-09-11 DIAGNOSIS — E440 Moderate protein-calorie malnutrition: Secondary | ICD-10-CM | POA: Insufficient documentation

## 2015-09-11 LAB — CBC
HEMATOCRIT: 36.9 % (ref 35.0–47.0)
HEMOGLOBIN: 12.6 g/dL (ref 12.0–16.0)
MCH: 28.7 pg (ref 26.0–34.0)
MCHC: 34.1 g/dL (ref 32.0–36.0)
MCV: 84.2 fL (ref 80.0–100.0)
Platelets: 147 10*3/uL — ABNORMAL LOW (ref 150–440)
RBC: 4.38 MIL/uL (ref 3.80–5.20)
RDW: 16.1 % — AB (ref 11.5–14.5)
WBC: 8.5 10*3/uL (ref 3.6–11.0)

## 2015-09-11 LAB — GLUCOSE, CAPILLARY
Glucose-Capillary: 225 mg/dL — ABNORMAL HIGH (ref 65–99)
Glucose-Capillary: 169 mg/dL — ABNORMAL HIGH (ref 65–99)

## 2015-09-11 MED ORDER — ALBUTEROL SULFATE (2.5 MG/3ML) 0.083% IN NEBU: 2.5000 mg | INHALATION_SOLUTION | Freq: Four times a day (QID) | RESPIRATORY_TRACT | Status: DC | PRN

## 2015-09-11 MED ORDER — PREDNISONE 10 MG PO TABS: ORAL_TABLET | ORAL | Status: DC

## 2015-09-11 MED ORDER — CEFUROXIME AXETIL 500 MG PO TABS: 500.0000 mg | ORAL_TABLET | Freq: Two times a day (BID) | ORAL | Status: DC

## 2015-09-11 MED ORDER — GUAIFENESIN ER 600 MG PO TB12: 600.0000 mg | ORAL_TABLET | Freq: Two times a day (BID) | ORAL | Status: DC

## 2015-09-11 MED ORDER — AZITHROMYCIN 250 MG PO TABS
500.0000 mg | ORAL_TABLET | Freq: Every day | ORAL | Status: DC
  Administered 2015-09-11: 500 mg via ORAL
  Filled 2015-09-11: qty 2

## 2015-09-11 MED ORDER — PREDNISONE 50 MG PO TABS: 50.0000 mg | ORAL_TABLET | Freq: Every day | ORAL | Status: DC

## 2015-09-11 MED ORDER — AZITHROMYCIN 250 MG PO TABS: ORAL_TABLET | ORAL | Status: DC

## 2015-09-11 NOTE — Progress Notes (Signed)
Pt to be discharged per MD order. IV removed. Instructions reviewed with pt and all questions answered. Taken out in wheelchair.

## 2015-09-11 NOTE — Progress Notes (Signed)
Physical Therapy Treatment Patient Details Name: Kathleen [name] MRN: [id number] DOB: [birth year] Today's Date: 09/11/2015    History of Present Illness Pt is a 70 y.o. female presenting with SOB, cough, and wheezing and admitted with acute on chronic respiratory failure with hypoxia and B PNA.  PMH includes COPD/BOOP, respiratory failure, on 2 L O2 [place] at night, a-fib, pacemaker.      PT Comments    Pt with complains of increased cough, wheezing and shortness of breath feeling. O2 saturation 95% at rest and 94% throughout activity during session. Heart rate between 80*84 beats per minute. Pt demonstrates ability to ambulate with single point cane, which she does normally use outside of the house. Pt notes feeling weaker and slower with ambulation than baseline. Pt also demonstrates stair climbing with 1 hand rail and cane; pt educated on performing sideways as well with 2 hands on rail and demonstrates technique descending. Pt requires several minute rest periods between walks. Encouraged lower extremity exercises for activity tolerance and strengthening. Pt able to demonstrate toileting with supervision only. Pt up in chair comfortably. Pt educated on energy conservation techniques as well. Continue PT to progress strength, endurance to improve all functional mobility to allow optimal, safe return home.   Follow Up Recommendations  No PT follow up     Equipment Recommendations       Recommendations for Other Services       Precautions / Restrictions Precautions Precautions: Fall Restrictions Weight Bearing Restrictions: No    Mobility  Bed Mobility Overal bed mobility: Modified Independent                Transfers Overall transfer level: Needs assistance Equipment used: Straight cane Transfers: Sit to/from Stand Sit to Stand: Min guard         General transfer comment: Slow subjectively feels mildly weak and SOB. No LOB. Can measured for appropriate height and pt  educated on how to do so.   Ambulation/Gait Ambulation/Gait assistance: Min guard Ambulation Distance (Feet): 110 Feet (2nd 50 ft, steps, +20 ft. 3rd walk 110 ft) Assistive device: Straight cane Gait Pattern/deviations: Step-through pattern Gait velocity: reduced Gait velocity interpretation: Below normal speed for age/gender General Gait Details: Mildly guarded at times; notes feeling weak. Several minute rest periods require between walks. O2 saturation remain 94%    Stairs Stairs: Yes Stairs assistance: Min guard Stair Management: One rail Left;Step to pattern;Forwards;Sideways (forward up/sideways down) Number of Stairs: 4    Wheelchair Mobility    Modified Rankin (Stroke Patients Only)       Balance Overall balance assessment: No apparent balance deficits (not formally assessed)                                  Cognition Arousal/Alertness: Awake/alert Behavior During Therapy: WFL for tasks assessed/performed Overall Cognitive Status: Within Functional Limits for tasks assessed                      Exercises General Exercises - Lower Extremity Long Arc Quad: AROM;Strengthening;Both;20 reps;Seated Hip Flexion/Marching: AROM;Strengthening;20 reps;Seated Toe Raises: AROM;Both;20 reps;Seated Heel Raises: AROM;Both;20 reps;Seated Other Exercises Other Exercises: Supervision for toileting    General Comments        Pertinent Vitals/Pain Pain Assessment: No/denies pain    Home Living                      Prior  Function            PT Goals (current goals can now be found in the care plan section) Progress towards PT goals: Progressing toward goals    Frequency  Min 2X/week    PT Plan Current plan remains appropriate    Co-evaluation             End of Session Equipment Utilized During Treatment: Gait belt;Oxygen Activity Tolerance: Patient limited by fatigue;Patient tolerated treatment well (limited by feeling SOB,  weak, wheezing) Patient left: in chair;with call bell/phone within reach;with family/visitor present (Pt is a moderate fall risk )     Time: 1100-1151 PT Time Calculation (min) (ACUTE ONLY): 51 min  Charges:  $Gait Training: 23-37 mins $Therapeutic Exercise: 8-22 mins                    G Codes:      [name], PTA 09/11/2015, 1:33 PM

## 2015-09-11 NOTE — Discharge Summary (Addendum)
[facility] [place] [facility]   PATIENT NAME: Kathleen [name]    MR#:  [id number]  DATE OF BIRTH:  1945/07/16  DATE OF ADMISSION:  09/08/2015 ADMITTING PHYSICIAN: Katharina Caper, MD  DATE OF DISCHARGE: 09/11/2015  PRIMARY CARE PHYSICIAN: Dione Housekeeper, MD    ADMISSION DIAGNOSIS:  Community acquired pneumonia [J18.9] COPD exacerbation (HCC) [J44.1]  DISCHARGE DIAGNOSIS:  COPD exacerbation Pneumonia SECONDARY DIAGNOSIS:   Past Medical History  Diagnosis Date  . Atrial fibrillation (HCC)   . COPD (chronic obstructive pulmonary disease) (HCC)   . Asthma   . Pacemaker   . 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HCC)   . 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HCC)     HOSPITAL COURSE:   1. Acute hypoxic respiratory failure. Patient only wears oxygen at night. Now requiring it during the day. Pulse ox 89% on room air.pt;s sats remains >92% with exerion on RA per PT notes. Change to po steroid taper. 2. Bilateral pneumonia and failed outpatient treatment. Patient was also influenza positive a as outpatient. Wasn't feeling well and had a week of Levaquin as outpatient. Rocephin and Zithromax while here--->change to po ceftin and zithromax 3. COPD exacerbation continue Solu-Medrol daily and nebulizer treatments-->po steroid taper 4. Thrush nystatin swish and swallow 5. History of atrial fibrillation anticoagulated with Xarelto and rate controlled 6. Hypothyroidism unspecified on levothyroxine 7. Hypokalemia potassium replacement 8. Discontinue telemetry 9. Weakness appreciated physical therapy evaluation-no PT recommendations  Overall at baseline except some cough D/c home CONSULTS OBTAINED:     DRUG ALLERGIES:   Allergies  Allergen Reactions  . Allopurinol     Other reaction(s): Dizziness  . Dronedarone Rash  . Clindamycin Hives    Throat swelling  . Meperidine Nausea And Vomiting  .  Penicillins Hives    Has patient had a PCN reaction causing immediate rash, facial/tongue/throat swelling, SOB or lightheadedness with hypotension: Yes Has patient had a PCN reaction causing severe rash involving mucus membranes or skin necrosis: No Has patient had a PCN reaction that required hospitalization No Has patient had a PCN reaction occurring within the last 10 years: No If all of the above answers are "NO", then may proceed with Cephalosporin use.  . Rosuvastatin Other (See Comments)  . Tetracycline Hives  . Lovastatin Rash  . Other Rash    Prolonged tape adhesive causes rash   . Pneumococcal 13-Val Conj Vacc Itching, Rash and Swelling    Local redness, itching, firm warm at injection sight.    DISCHARGE MEDICATIONS:   Current Discharge Medication List    START taking these medications   Details  albuterol (PROVENTIL) (2.5 MG/3ML) 0.083% nebulizer solution Take 3 mLs (2.5 mg total) by nebulization every 6 (six) hours as needed for wheezing or shortness of breath. Qty: 75 mL, Refills: 12    azithromycin (ZITHROMAX) 250 MG tablet For 5 days Qty: 5 each, Refills: 0    cefUROXime (CEFTIN) 500 MG tablet Take 1 tablet (500 mg total) by mouth 2 (two) times daily with a meal. Qty: 10 tablet, Refills: 0    guaiFENesin (MUCINEX) 600 MG 12 hr tablet Take 1 tablet (600 mg total) by mouth 2 (two) times daily. Qty: 14 tablet, Refills: 0    predniSONE (DELTASONE) 10 MG tablet Take 50 mg on day 1 and taper by 10 mg daily then stop Qty: 15 tablet, Refills: 0      CONTINUE these medications which have NOT CHANGED   Details  albuterol (PROAIR HFA) 108 (90 Base) MCG/ACT inhaler Inhale 2 puffs into the lungs every 4 (four) hours as needed. For wheezing or shortness of breath.    benzonatate (TESSALON) 200 MG capsule Take 200 mg by mouth 3 (three) times daily as needed. For cough.    chlorpheniramine-HYDROcodone (TUSSIONEX) 10-8 MG/5ML SUER Take 5 mLs by mouth every 12 (twelve) hours  as needed. For cough. Refills: 0    Cholecalciferol (VITAMIN D) 2000 units tablet Take 2,000 Units by mouth at bedtime.    colchicine 0.6 MG tablet Take 1 tablet (0.6 mg total) by mouth daily. Take 2 tablets first day, 1 hour later take 1 more tab for a total of 3 tabs on the 1st day Take 1 tab daily for at least 6 more days. If  Symptoms persist past 6 days continue to use until prescription is finished Qty: 20 tablet, Refills: 0    fluticasone (FLONASE) 50 MCG/ACT nasal spray Place 2 sprays into the nose every morning.    levothyroxine (SYNTHROID, LEVOTHROID) 75 MCG tablet Take 75 mcg by mouth daily. Refills: 2    metroNIDAZOLE (METROCREAM) 0.75 % cream Apply 1 application topically 2 (two) times daily as needed. For rash Refills: 1    montelukast (SINGULAIR) 10 MG tablet Take 10 mg by mouth at bedtime.    OXYGEN Inhale 2 L/min into the lungs at bedtime as needed (for breathing.).    rivaroxaban (XARELTO) 20 MG TABS tablet Take 20 mg by mouth at bedtime.    senna-docusate (SENOKOT S) 8.6-50 MG per tablet Take 1 tablet by mouth daily as needed for mild constipation or moderate constipation.     SPIRIVA HANDIHALER 18 MCG inhalation capsule Place 18 mcg into inhaler and inhale daily. Refills: 11    spironolactone (ALDACTONE) 50 MG tablet Take 75 mg by mouth daily.    SYMBICORT 160-4.5 MCG/ACT inhaler Inhale 2 puffs into the lungs 2 (two) times daily. Refills: 12    torsemide (DEMADEX) 20 MG tablet Take 20 mg by mouth daily.    ULORIC 40 MG tablet Take 40 mg by mouth daily. Refills: 1    oxyCODONE-acetaminophen (ROXICET) 5-325 MG tablet Take 1 tablet by mouth every 6 (six) hours as needed. Qty: 20 tablet, Refills: 0        If you experience worsening of your admission symptoms, develop shortness of breath, life threatening emergency, suicidal or homicidal thoughts you must seek medical attention immediately by calling 911 or calling your MD immediately  if symptoms less  severe.  You Must read complete instructions/literature along with all the possible adverse reactions/side effects for all the Medicines you take and that have been prescribed to you. Take any new Medicines after you have completely understood and accept all the possible adverse reactions/side effects.   Please note  You were cared for by a hospitalist during your hospital stay. If you have any questions about your discharge medications or the care you received while you were in the hospital after you are discharged, you can call the unit and asked to speak with the hospitalist on call if the hospitalist that took care of you is not available. Once you are discharged, your primary care physician will handle any further medical issues. Please note that NO REFILLS for any discharge medications will be authorized once you are discharged, as it is imperative that you return to your primary care physician (or establish a relationship with a primary care physician if you do not have one) for  your aftercare needs so that they can reassess your need for medications and monitor your lab values. Today   SUBJECTIVE   Dry cough VITAL SIGNS:  Blood pressure 128/70, pulse 81, temperature 97.5 F (36.4 C), temperature source Oral, resp. rate 20, height 5\' 2"  (1.575 m), weight 95.664 kg (210 lb 14.4 oz), SpO2 93 %.  I/O:    Intake/Output Summary (Last 24 hours) at 09/11/15 1625 Last data filed at 09/11/15 1300  Gross per 24 hour  Intake   1220 ml  Output   3075 ml  Net  -1855 ml    PHYSICAL EXAMINATION:  GENERAL:  70 y.o.-year-old patient lying in the bed with no acute distress.  EYES: Pupils equal, round, reactive to light and accommodation. No scleral icterus. Extraocular muscles intact.  HEENT: Head atraumatic, normocephalic. Oropharynx and nasopharynx clear.  NECK:  Supple, no jugular venous distention. No thyroid enlargement, no tenderness.  LUNGS: Normal breath sounds bilaterally, no wheezing,  rales,rhonchi or crepitation. No use of accessory muscles of respiration.  CARDIOVASCULAR: S1, S2 normal. No murmurs, rubs, or gallops.  ABDOMEN: Soft, non-tender, non-distended. Bowel sounds present. No organomegaly or mass.  EXTREMITIES: No pedal edema, cyanosis, or clubbing.  NEUROLOGIC: Cranial nerves II through XII are intact. Muscle strength 5/5 in all extremities. Sensation intact. Gait not checked.  PSYCHIATRIC: The patient is alert and oriented x 3.  SKIN: No obvious rash, lesion, or ulcer.   DATA REVIEW:   CBC   Recent Labs Lab 09/11/15 0405  WBC 8.5  HGB 12.6  HCT 36.9  PLT 147*    Chemistries   Recent Labs Lab 09/09/15 0526 09/10/15 0403  NA 134* 134*  K 3.0* 3.9  CL 103 100*  CO2 24 24  GLUCOSE 229* 255*  BUN 15 20  CREATININE 0.85 0.89  CALCIUM 8.8* 9.3  MG  --  1.9  AST 15  --   ALT 12*  --   ALKPHOS 47  --   BILITOT 1.1  --     Microbiology Results   Recent Results (from the past 240 hour(s))  Rapid Influenza A&B Antigens ([facility] only)     Status: None   Collection Time: 09/08/15  5:21 PM  Result Value Ref Range Status   Influenza A ([facility]) NEGATIVE NEGATIVE Final   Influenza B ([facility]) NEGATIVE NEGATIVE Final  Culture, blood (Routine X 2) w Reflex to ID Panel     Status: None (Preliminary result)   Collection Time: 09/08/15  5:21 PM  Result Value Ref Range Status   Specimen Description BLOOD LEFT ANTECUBITAL  Final   Special Requests BOTTLES DRAWN AEROBIC AND ANAEROBIC  5CC  Final   Culture NO GROWTH 3 DAYS  Final   Report Status PENDING  Incomplete  Culture, blood (Routine X 2) w Reflex to ID Panel     Status: None (Preliminary result)   Collection Time: 09/08/15  5:21 PM  Result Value Ref Range Status   Specimen Description BLOOD RIGHT ANTECUBITAL  Final   Special Requests BOTTLES DRAWN AEROBIC AND ANAEROBIC  4CC  Final   Culture NO GROWTH 3 DAYS  Final   Report Status PENDING  Incomplete    RADIOLOGY:  No results found.   Management  plans discussed with the patient, family and they are in agreement.  CODE STATUS:     Code Status Orders        Start     Ordered   09/08/15 2032  Full code   Continuous  09/08/15 2032    Code Status History    Date Active Date Inactive Code Status Order ID Comments User Context   This patient has a current code status but no historical code status.      TOTAL TIME TAKING CARE OF THIS PATIENT: 40 minutes.    Jacorey Donaway M.D on 09/11/2015 at 4:25 PM  Between 7am to 6pm - Pager - [phone] After 6pm go to [email or web address] - password [email or web address]  [facility]  Office  [phone]  CC: Primary care physician; Dione Housekeeper, MD

## 2015-09-12 LAB — HEMOGLOBIN A1C: HEMOGLOBIN A1C: 6.1 % — AB (ref 4.0–6.0)

## 2015-09-13 LAB — CULTURE, BLOOD (ROUTINE X 2)
CULTURE: NO GROWTH
Culture: NO GROWTH

## 2015-09-17 ENCOUNTER — Emergency Department: Payer: Medicare Other

## 2015-09-17 ENCOUNTER — Encounter: Payer: Self-pay | Admitting: *Deleted

## 2015-09-17 ENCOUNTER — Inpatient Hospital Stay
Admission: EM | Admit: 2015-09-17 | Discharge: 2015-09-23 | DRG: 190 | Disposition: A | Discharge status: Home Health | Payer: Medicare Other | Attending: Internal Medicine | Admitting: Internal Medicine

## 2015-09-17 DIAGNOSIS — Z950 Presence of cardiac pacemaker: Secondary | ICD-10-CM

## 2015-09-17 DIAGNOSIS — I4891 Unspecified atrial fibrillation: Secondary | ICD-10-CM | POA: Diagnosis present

## 2015-09-17 DIAGNOSIS — Z881 Allergy status to other antibiotic agents status: Secondary | ICD-10-CM | POA: Diagnosis not present

## 2015-09-17 DIAGNOSIS — Z9981 Dependence on supplemental oxygen: Secondary | ICD-10-CM

## 2015-09-17 DIAGNOSIS — Z880 Allergy status to penicillin: Secondary | ICD-10-CM

## 2015-09-17 DIAGNOSIS — G4730 Sleep apnea, unspecified: Secondary | ICD-10-CM | POA: Diagnosis present

## 2015-09-17 DIAGNOSIS — J441 Chronic obstructive pulmonary disease with (acute) exacerbation: Secondary | ICD-10-CM | POA: Diagnosis present

## 2015-09-17 DIAGNOSIS — Z887 Allergy status to serum and vaccine status: Secondary | ICD-10-CM | POA: Diagnosis not present

## 2015-09-17 DIAGNOSIS — J440 Chronic obstructive pulmonary disease with acute lower respiratory infection: Principal | ICD-10-CM | POA: Diagnosis present

## 2015-09-17 DIAGNOSIS — Z7901 Long term (current) use of anticoagulants: Secondary | ICD-10-CM

## 2015-09-17 DIAGNOSIS — I480 Paroxysmal atrial fibrillation: Secondary | ICD-10-CM | POA: Diagnosis present

## 2015-09-17 DIAGNOSIS — J209 Acute bronchitis, unspecified: Secondary | ICD-10-CM | POA: Diagnosis present

## 2015-09-17 DIAGNOSIS — R0602 Shortness of breath: Secondary | ICD-10-CM

## 2015-09-17 DIAGNOSIS — J9621 Acute and chronic respiratory failure with hypoxia: Secondary | ICD-10-CM | POA: Diagnosis present

## 2015-09-17 DIAGNOSIS — Z87891 Personal history of nicotine dependence: Secondary | ICD-10-CM | POA: Diagnosis not present

## 2015-09-17 DIAGNOSIS — I482 Chronic atrial fibrillation: Secondary | ICD-10-CM | POA: Diagnosis present

## 2015-09-17 DIAGNOSIS — I5032 Chronic diastolic (congestive) heart failure: Secondary | ICD-10-CM | POA: Diagnosis present

## 2015-09-17 DIAGNOSIS — Z888 Allergy status to other drugs, medicaments and biological substances status: Secondary | ICD-10-CM

## 2015-09-17 DIAGNOSIS — R0600 Dyspnea, unspecified: Secondary | ICD-10-CM

## 2015-09-17 DIAGNOSIS — F419 Anxiety disorder, unspecified: Secondary | ICD-10-CM | POA: Diagnosis present

## 2015-09-17 LAB — BRAIN NATRIURETIC PEPTIDE: B Natriuretic Peptide: 90 pg/mL (ref 0.0–100.0)

## 2015-09-17 LAB — CBC WITH DIFFERENTIAL/PLATELET
BAND NEUTROPHILS: 8 %
BASOS PCT: 0 %
Basophils Absolute: 0 10*3/uL (ref 0–0.1)
Blasts: 0 %
EOS ABS: 0.2 10*3/uL (ref 0–0.7)
Eosinophils Relative: 1 %
HEMATOCRIT: 47.6 % — AB (ref 35.0–47.0)
HEMOGLOBIN: 16 g/dL (ref 12.0–16.0)
LYMPHS PCT: 19 %
Lymphs Abs: 3.1 10*3/uL (ref 1.0–3.6)
MCH: 28.1 pg (ref 26.0–34.0)
MCHC: 33.7 g/dL (ref 32.0–36.0)
MCV: 83.4 fL (ref 80.0–100.0)
MONO ABS: 0.2 10*3/uL (ref 0.2–0.9)
MONOS PCT: 1 %
Metamyelocytes Relative: 0 %
Myelocytes: 2 %
NEUTROS ABS: 12.8 10*3/uL — AB (ref 1.4–6.5)
NEUTROS PCT: 69 %
NRBC: 0 /100{WBCs}
OTHER: 0 %
Platelets: 371 10*3/uL (ref 150–440)
Promyelocytes Absolute: 0 %
RBC: 5.7 MIL/uL — ABNORMAL HIGH (ref 3.80–5.20)
RDW: 16.2 % — AB (ref 11.5–14.5)
WBC: 16.3 10*3/uL — ABNORMAL HIGH (ref 3.6–11.0)

## 2015-09-17 LAB — BASIC METABOLIC PANEL
Anion gap: 12 (ref 5–15)
BUN: 20 mg/dL (ref 6–20)
CALCIUM: 9.5 mg/dL (ref 8.9–10.3)
CHLORIDE: 101 mmol/L (ref 101–111)
CO2: 25 mmol/L (ref 22–32)
CREATININE: 0.89 mg/dL (ref 0.44–1.00)
GFR calc non Af Amer: >60 mL/min (ref >60)
Glucose, Bld: 116 mg/dL — ABNORMAL HIGH (ref 65–99)
Potassium: 3.6 mmol/L (ref 3.5–5.1)
SODIUM: 138 mmol/L (ref 135–145)

## 2015-09-17 LAB — TROPONIN I

## 2015-09-17 MED ORDER — LEVOTHYROXINE SODIUM 75 MCG PO TABS
75.0000 ug | ORAL_TABLET | Freq: Every day | ORAL | Status: DC
  Administered 2015-09-18 – 2015-09-23 (×6): 75 ug via ORAL
  Filled 2015-09-17 (×6): qty 1

## 2015-09-17 MED ORDER — SODIUM CHLORIDE 0.9 % IV SOLN: 250.0000 mL | INTRAVENOUS | Status: DC | PRN

## 2015-09-17 MED ORDER — SODIUM CHLORIDE 0.9% FLUSH
3.0000 mL | INTRAVENOUS | Status: DC | PRN
  Administered 2015-09-23: 08:00:00 3 mL via INTRAVENOUS
  Filled 2015-09-17: qty 3

## 2015-09-17 MED ORDER — HYDROCOD POLST-CPM POLST ER 10-8 MG/5ML PO SUER
5.0000 mL | Freq: Once | ORAL | Status: AC
  Administered 2015-09-17: 5 mL via ORAL
  Filled 2015-09-17: qty 5

## 2015-09-17 MED ORDER — TORSEMIDE 20 MG PO TABS
20.0000 mg | ORAL_TABLET | Freq: Every day | ORAL | Status: DC
  Administered 2015-09-18 – 2015-09-23 (×6): 20 mg via ORAL
  Filled 2015-09-17 (×6): qty 1

## 2015-09-17 MED ORDER — ACETAMINOPHEN 325 MG PO TABS: 650.0000 mg | ORAL_TABLET | Freq: Four times a day (QID) | ORAL | Status: DC | PRN

## 2015-09-17 MED ORDER — TIOTROPIUM BROMIDE MONOHYDRATE 18 MCG IN CAPS
18.0000 ug | ORAL_CAPSULE | Freq: Every day | RESPIRATORY_TRACT | Status: DC
  Administered 2015-09-18 – 2015-09-23 (×6): 18 ug via RESPIRATORY_TRACT
  Filled 2015-09-17 (×2): qty 5

## 2015-09-17 MED ORDER — FEBUXOSTAT 40 MG PO TABS
40.0000 mg | ORAL_TABLET | Freq: Every day | ORAL | Status: DC
  Administered 2015-09-18 – 2015-09-23 (×6): 40 mg via ORAL
  Filled 2015-09-17 (×6): qty 1

## 2015-09-17 MED ORDER — MONTELUKAST SODIUM 10 MG PO TABS
10.0000 mg | ORAL_TABLET | Freq: Every day | ORAL | Status: DC
  Administered 2015-09-17 – 2015-09-22 (×6): 10 mg via ORAL
  Filled 2015-09-17 (×7): qty 1

## 2015-09-17 MED ORDER — RIVAROXABAN 20 MG PO TABS
20.0000 mg | ORAL_TABLET | Freq: Every day | ORAL | Status: DC
  Administered 2015-09-17 – 2015-09-22 (×6): 20 mg via ORAL
  Filled 2015-09-17 (×6): qty 1

## 2015-09-17 MED ORDER — ONDANSETRON HCL 4 MG PO TABS: 4.0000 mg | ORAL_TABLET | Freq: Four times a day (QID) | ORAL | Status: DC | PRN

## 2015-09-17 MED ORDER — ONDANSETRON HCL 4 MG/2ML IJ SOLN: 4.0000 mg | Freq: Four times a day (QID) | INTRAMUSCULAR | Status: DC | PRN

## 2015-09-17 MED ORDER — POLYETHYLENE GLYCOL 3350 17 G PO PACK: 17.0000 g | PACK | Freq: Every day | ORAL | Status: DC | PRN

## 2015-09-17 MED ORDER — FLUTICASONE PROPIONATE 50 MCG/ACT NA SUSP
1.0000 | Freq: Every day | NASAL | Status: DC
  Administered 2015-09-18 – 2015-09-23 (×6): 1 via NASAL
  Filled 2015-09-17: qty 16

## 2015-09-17 MED ORDER — SPIRONOLACTONE 25 MG PO TABS
75.0000 mg | ORAL_TABLET | Freq: Every day | ORAL | Status: DC
  Administered 2015-09-18 – 2015-09-23 (×6): 75 mg via ORAL
  Filled 2015-09-17 (×8): qty 3

## 2015-09-17 MED ORDER — IPRATROPIUM-ALBUTEROL 0.5-2.5 (3) MG/3ML IN SOLN
3.0000 mL | RESPIRATORY_TRACT | Status: DC
  Administered 2015-09-17 – 2015-09-18 (×5): 3 mL via RESPIRATORY_TRACT
  Filled 2015-09-17 (×6): qty 3

## 2015-09-17 MED ORDER — MOMETASONE FURO-FORMOTEROL FUM 200-5 MCG/ACT IN AERO
2.0000 | INHALATION_SPRAY | Freq: Two times a day (BID) | RESPIRATORY_TRACT | Status: DC
  Administered 2015-09-17 – 2015-09-23 (×12): 2 via RESPIRATORY_TRACT
  Filled 2015-09-17: qty 8.8

## 2015-09-17 MED ORDER — SODIUM CHLORIDE 0.9% FLUSH
3.0000 mL | Freq: Two times a day (BID) | INTRAVENOUS | Status: DC
  Administered 2015-09-17 – 2015-09-22 (×11): 3 mL via INTRAVENOUS

## 2015-09-17 MED ORDER — IOPAMIDOL (ISOVUE-370) INJECTION 76%
100.0000 mL | Freq: Once | INTRAVENOUS | Status: AC | PRN
  Administered 2015-09-17: 100 mL via INTRAVENOUS

## 2015-09-17 MED ORDER — METHYLPREDNISOLONE SODIUM SUCC 125 MG IJ SOLR
60.0000 mg | INTRAMUSCULAR | Status: DC
  Administered 2015-09-18 – 2015-09-21 (×4): 60 mg via INTRAVENOUS
  Filled 2015-09-17 (×4): qty 2

## 2015-09-17 MED ORDER — ACETAMINOPHEN 650 MG RE SUPP: 650.0000 mg | Freq: Four times a day (QID) | RECTAL | Status: DC | PRN

## 2015-09-17 MED ORDER — HYDROCOD POLST-CPM POLST ER 10-8 MG/5ML PO SUER
5.0000 mL | Freq: Two times a day (BID) | ORAL | Status: DC
  Administered 2015-09-17 – 2015-09-23 (×12): 5 mL via ORAL
  Filled 2015-09-17 (×12): qty 5

## 2015-09-17 MED ORDER — IPRATROPIUM-ALBUTEROL 0.5-2.5 (3) MG/3ML IN SOLN
3.0000 mL | Freq: Once | RESPIRATORY_TRACT | Status: AC
  Administered 2015-09-17: 3 mL via RESPIRATORY_TRACT
  Filled 2015-09-17: qty 3

## 2015-09-17 MED ORDER — BENZONATATE 100 MG PO CAPS
100.0000 mg | ORAL_CAPSULE | Freq: Three times a day (TID) | ORAL | Status: DC | PRN
  Administered 2015-09-17 – 2015-09-20 (×2): 100 mg via ORAL
  Filled 2015-09-17 (×2): qty 1

## 2015-09-17 NOTE — ED Notes (Signed)
Pt was able to stand and ambulate independently to toilet. Pt remained on oxygen and did not destat. Pt in no acute distress at this time. No SOB after ambulation.

## 2015-09-17 NOTE — ED Notes (Addendum)
Pt arrived to ED reporting increased SOB and nonproductive cough. Pt was seen at [facility] today for symptoms. MD called [facility] so pt could be taken to ED. EMS reports pt was 85% on RA. Pt is 96% on 4L upon arrival to ED. Pt was d/c from hospital on Wednesday (4/5) after receiving treatment for pneumonia. Pt also had the flu 4 weeks ago.  Administered in route to ED:  1 Duoneb 125 Solu-Medrol

## 2015-09-17 NOTE — ED Notes (Signed)
Respiratory called and informed [facility] at bedside.

## 2015-09-17 NOTE — H&P (Signed)
[facility] [place] at [facility]   PATIENT NAME: Kathleen [name]    MR#:  [id number]  DATE OF BIRTH:  Dec 25, 1945  DATE OF ADMISSION:  09/17/2015  PRIMARY CARE PHYSICIAN: Dione Housekeeper, MD   REQUESTING/REFERRING PHYSICIAN: Dr. Mayford Knife  CHIEF COMPLAINT:   Chief Complaint  Patient presents with  . Shortness of Breath    HISTORY OF PRESENT ILLNESS:  Kathleen [name]  is a 70 y.o. female with a known history of COPD, sleep apnea, paroxysmal atrial fibrillation, COP presents to the emergency room with worsening shortness of breath and cough. Patient was recently in the hospital for acute bronchitis treated with steroids, antibiotics, nebulizers and improved well. On day of discharge patient's saturations were 92% on room air on ambulation. Patient was discharged home which she started worsening later. She started using oxygen even during the daytime she normally uses only at night. Today her saturations were 85% on 4 L oxygen when EMS saw her. Here patient has severe wheezing. No orthopnea or lower extremity edema. Afebrile. CT scan of the chest showed no pulmonary embolism or pneumonia. Patient is being admitted for COPD exacerbation with acute hypoxic respiratory failure. Dry cough.  PAST MEDICAL HISTORY:   Past Medical History  Diagnosis Date  . Atrial fibrillation (HCC)   . COPD (chronic obstructive pulmonary disease) (HCC)   . Asthma   . Pacemaker   . 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HCC)   . 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HCC)     PAST SURGICAL HISTORY:   Past Surgical History  Procedure Laterality Date  . Breast biopsy Bilateral 1997    negative  . Pacemaker insertion    . Abdominal hysterectomy    . Cholecystectomy    . Hernia repair    . Cardiac catheterization      SOCIAL HISTORY:   Social History  Substance Use Topics  . Smoking status: Former Games developer  . Smokeless tobacco:  Never Used  . Alcohol Use: No    FAMILY HISTORY:   Family History  Problem Relation Age of Onset  . Breast cancer Mother 72    3 different times  . Breast cancer Cousin     DRUG ALLERGIES:   Allergies  Allergen Reactions  . Allopurinol Other (See Comments)    Reaction:  Dizziness   . Clindamycin Anaphylaxis and Hives  . Dronedarone Rash  . Meperidine Nausea And Vomiting  . Penicillins Hives and Other (See Comments)    Has patient had a PCN reaction causing immediate rash, facial/tongue/throat swelling, SOB or lightheadedness with hypotension: No Has patient had a PCN reaction causing severe rash involving mucus membranes or skin necrosis: No Has patient had a PCN reaction that required hospitalization No Has patient had a PCN reaction occurring within the last 10 years: No If all of the above answers are "NO", then may proceed with Cephalosporin use.  . Rosuvastatin Other (See Comments)    Reaction:  Muscle spasms   . Tetracycline Hives  . Adhesive [Tape] Rash  . Lovastatin Rash  . Pneumococcal 13-Val Conj Vacc Itching, Swelling and Rash    REVIEW OF SYSTEMS:   Review of Systems  Constitutional: Positive for malaise/fatigue. Negative for fever, chills and weight loss.  HENT: Negative for hearing loss and nosebleeds.   Eyes: Negative for blurred vision, double vision and pain.  Respiratory: Positive for cough, sputum production, shortness of breath and wheezing. Negative for hemoptysis.   Cardiovascular: Positive for chest  pain (pleuritic). Negative for palpitations, orthopnea and leg swelling.  Gastrointestinal: Negative for nausea, vomiting, abdominal pain, diarrhea and constipation.  Genitourinary: Negative for dysuria and hematuria.  Musculoskeletal: Negative for myalgias, back pain and falls.  Skin: Negative for rash.  Neurological: Positive for weakness. Negative for dizziness, tremors, sensory change, speech change, focal weakness, seizures and headaches.   Endo/Heme/Allergies: Does not bruise/bleed easily.  Psychiatric/Behavioral: Negative for depression and memory loss. The patient is not nervous/anxious.     MEDICATIONS AT HOME:   Prior to Admission medications   Medication Sig Start Date End Date Taking? Authorizing Provider  albuterol (PROVENTIL HFA;VENTOLIN HFA) 108 (90 Base) MCG/ACT inhaler Inhale 2 puffs into the lungs every 4 (four) hours as needed for wheezing or shortness of breath.   Yes Historical Provider, MD  albuterol (PROVENTIL) (2.5 MG/3ML) 0.083% nebulizer solution Take 3 mLs (2.5 mg total) by nebulization every 6 (six) hours as needed for wheezing or shortness of breath. 09/11/15  Yes Enedina Finner, MD  benzonatate (TESSALON) 200 MG capsule Take 200 mg by mouth 3 (three) times daily as needed for cough.    Yes Historical Provider, MD  budesonide-formoterol (SYMBICORT) 160-4.5 MCG/ACT inhaler Inhale 2 puffs into the lungs 2 (two) times daily.   Yes Historical Provider, MD  cholecalciferol (VITAMIN D) 1000 units tablet Take 2,000 Units by mouth at bedtime.   Yes Historical Provider, MD  colchicine 0.6 MG tablet Take 0.6-1.2 mg by mouth daily as needed (for gout flares).   Yes Historical Provider, MD  febuxostat (ULORIC) 40 MG tablet Take 40 mg by mouth daily.   Yes Historical Provider, MD  fluticasone (FLONASE) 50 MCG/ACT nasal spray Place 1 spray into both nostrils daily.    Yes Historical Provider, MD  guaiFENesin-codeine 100-10 MG/5ML syrup Take 5 mLs by mouth 3 (three) times daily as needed for cough.   Yes Historical Provider, MD  levothyroxine (SYNTHROID, LEVOTHROID) 75 MCG tablet Take 75 mcg by mouth daily before breakfast.    Yes Historical Provider, MD  montelukast (SINGULAIR) 10 MG tablet Take 10 mg by mouth at bedtime.   Yes Historical Provider, MD  rivaroxaban (XARELTO) 20 MG TABS tablet Take 20 mg by mouth daily with supper.    Yes Historical Provider, MD  spironolactone (ALDACTONE) 50 MG tablet Take 75 mg by mouth daily.    Yes Historical Provider, MD  tiotropium (SPIRIVA) 18 MCG inhalation capsule Place 18 mcg into inhaler and inhale daily.   Yes Historical Provider, MD  torsemide (DEMADEX) 20 MG tablet Take 20 mg by mouth daily.   Yes Historical Provider, MD  cefUROXime (CEFTIN) 500 MG tablet Take 1 tablet (500 mg total) by mouth 2 (two) times daily with a meal. Patient not taking: Reported on 09/17/2015 09/11/15   Enedina Finner, MD  guaiFENesin (MUCINEX) 600 MG 12 hr tablet Take 1 tablet (600 mg total) by mouth 2 (two) times daily. Patient not taking: Reported on 09/17/2015 09/11/15   Enedina Finner, MD  oxyCODONE-acetaminophen (ROXICET) 5-325 MG tablet Take 1 tablet by mouth every 6 (six) hours as needed. Patient not taking: Reported on 09/08/2015 06/23/15   [name], MD     VITAL SIGNS:  Blood pressure 136/86, pulse 79, temperature 98.5 F (36.9 C), temperature source Oral, resp. rate 16, height 5\' 2"  (1.575 m), weight 92.987 kg (205 lb), SpO2 96 %.  PHYSICAL EXAMINATION:  Physical Exam  GENERAL:  70 y.o.-year-old patient lying in the bed with conversational dyspnea EYES: Pupils equal, round, reactive to  light and accommodation. No scleral icterus. Extraocular muscles intact.  HEENT: Head atraumatic, normocephalic. Oropharynx and nasopharynx clear. No oropharyngeal erythema, moist oral mucosa  NECK:  Supple, no jugular venous distention. No thyroid enlargement, no tenderness.  LUNGS: Increased  work of breathing with bilateral wheezing. CARDIOVASCULAR: S1, S2 normal. No murmurs, rubs, or gallops.  ABDOMEN: Soft, nontender, nondistended. Bowel sounds present. No organomegaly or mass.  EXTREMITIES: No pedal edema, cyanosis, or clubbing. + 2 pedal & radial pulses b/l.   NEUROLOGIC: Cranial nerves II through XII are intact. No focal Motor or sensory deficits appreciated b/l PSYCHIATRIC: The patient is alert and oriented x 3. Good affect.  SKIN: No obvious rash, lesion, or ulcer.   LABORATORY PANEL:    CBC  Recent Labs Lab 09/17/15 1628  WBC 16.3*  HGB 16.0  HCT 47.6*  PLT 371   ------------------------------------------------------------------------------------------------------------------  Chemistries   Recent Labs Lab 09/17/15 1628  NA 138  K 3.6  CL 101  CO2 25  GLUCOSE 116*  BUN 20  CREATININE 0.89  CALCIUM 9.5   ------------------------------------------------------------------------------------------------------------------  Cardiac Enzymes  Recent Labs Lab 09/17/15 1628  TROPONINI <0.03   ------------------------------------------------------------------------------------------------------------------  RADIOLOGY:  Dg Chest 2 View  09/17/2015  CLINICAL DATA:  Shortness of breath, cough. EXAM: CHEST  2 VIEW COMPARISON:  September 08, 2015. FINDINGS: Stable cardiomediastinal silhouette. Left-sided pacemaker is unchanged in position. No pneumothorax or significant pleural effusion is noted. Right lung is clear. Mild left basilar subsegmental atelectasis is noted. Bony thorax is unremarkable. IMPRESSION: Mild left basilar subsegmental atelectasis. Electronically Signed   By: Lupita Raider, M.D.   On: 09/17/2015 16:33   Ct Angio Chest Pe W/cm &/or Wo Cm  09/17/2015  CLINICAL DATA:  Increasing shortness of breath and cough EXAM: CT ANGIOGRAPHY CHEST WITH CONTRAST TECHNIQUE: Multidetector CT imaging of the chest was performed using the standard protocol during bolus administration of intravenous contrast. Multiplanar CT image reconstructions and MIPs were obtained to evaluate the vascular anatomy. CONTRAST:  75 mL Isovue 370. COMPARISON:  06/23/2015 FINDINGS: The lungs are well aerated bilaterally. Diffuse emphysematous changes are identified. No focal infiltrate or sizable effusion is seen. Minimal left lower lobe atelectatic changes are seen. The thoracic inlet is within normal limits. The thoracic aorta and its branches are stable in appearance from the prior exam.  Moderate coronary calcifications are seen. The pulmonary artery is well visualized with a normal branching pattern. No filling defects to suggest pulmonary emboli are noted. No sizable hilar or mediastinal adenopathy is seen. The liver demonstrates a few hypodensities similar to that seen on the prior exam. Remainder the upper abdomen is within normal limits. No acute bony abnormality is noted. A pacing device is again seen. Review of the MIP images confirms the above findings. IMPRESSION: No evidence of pulmonary emboli. Emphysematous changes with mild left lower lobe atelectasis. The remainder of the study is stable from the previous exam. Electronically Signed   By: Alcide Clever M.D.   On: 09/17/2015 18:11     IMPRESSION AND PLAN:   * Acute COPD exacerbation with acute hypoxic respiratory failure -IV steroids, Antibiotics - Scheduled Nebulizers - Inhalers -Wean O2 as tolerated - Consult pulmonary if no improvement  * Pafib Continue patient's anticoagulation and rate control medications  * Chronic diastolic CHF Continue home dose of torsemide No pulmonary edema  * DVT prophylaxis Patient is on Xarelto  All the records are reviewed and case discussed with ED provider. Management plans discussed with the patient,  family and they are in agreement.  CODE STATUS: FULL  TOTAL TIME TAKING CARE OF THIS PATIENT: 40 minutes.   Milagros Loll R M.D on 09/17/2015 at 6:52 PM  Between 7am to 6pm - Pager - [phone]  After 6pm go to [email or web address] - password [email or web address] [facility]  [facility]  Office  [phone]  CC: Primary care physician; Dione Housekeeper, MD  Note: This dictation was prepared with Dragon dictation along with smaller phrase technology. Any transcriptional errors that result from this process are unintentional.

## 2015-09-17 NOTE — ED Notes (Signed)
Patient transported to CT 

## 2015-09-17 NOTE — ED Provider Notes (Signed)
[facility] Emergency Department Provider Note     Time seen: ----------------------------------------- 3:48 PM on 09/17/2015 -----------------------------------------    I have reviewed the triage vital signs and the nursing notes.   HISTORY  Chief Complaint Shortness of Breath    HPI Kathleen [name] is a 70 y.o. female who presents ER for progressively worsening shortness of breath. Patient states she was admitted to the hospital last week, went home and felt okay and has progressively gotten worse. She's not had any fevers or chills but has had worsening shortness of breath and cough. Patient states she feels like she's got a cough herself to death. She wears 3 L of oxygen all the time, has been using inhalers without any improvement. She was sent by her primary care doctor's office today.   Past Medical History  Diagnosis Date  . Atrial fibrillation (HCC)   . COPD (chronic obstructive pulmonary disease) (HCC)   . Asthma   . Pacemaker   . 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HCC)   . BOOP (bronchiolitis obliterans with organizing pneumonia) [facility])     Patient Active Problem List   Diagnosis Date Noted  . Malnutrition of moderate degree 09/11/2015  . Fever 09/08/2015  . Hyponatremia 09/08/2015  . Acute respiratory failure with hypoxia (HCC) 09/08/2015  . Bacterial pneumonia 09/08/2015  . Acute on chronic respiratory failure with hypoxia (HCC) 09/08/2015    Past Surgical History  Procedure Laterality Date  . Breast biopsy Bilateral 1997    negative  . Pacemaker insertion    . Abdominal hysterectomy    . Cholecystectomy    . Hernia repair    . Cardiac catheterization      Allergies Allopurinol; Dronedarone; Clindamycin; Meperidine; Penicillins; Rosuvastatin; Tetracycline; Lovastatin; Other; and Pneumococcal 13-val conj vacc  Social History Social History  Substance Use Topics  . Smoking status: Former Games developer  .  Smokeless tobacco: Never Used  . Alcohol Use: No    Review of Systems Constitutional: Negative for fever. Eyes: Negative for visual changes. ENT: Negative for sore throat. Cardiovascular: Negative for chest pain. Respiratory: Positive shortness of breath and cough Gastrointestinal: Negative for abdominal pain, vomiting and diarrhea. Genitourinary: Negative for dysuria. Musculoskeletal: Negative for back pain. Skin: Negative for rash. Neurological: Negative for headaches, positive for weakness  10-point ROS otherwise negative.  ____________________________________________   PHYSICAL EXAM:  VITAL SIGNS: ED Triage Vitals  Enc Vitals Group     BP --      Pulse --      Resp --      Temp --      Temp src --      SpO2 --      Weight --      Height --      Head Cir --      Peak Flow --      Pain Score --      Pain Loc --      Pain Edu? --      Excl. in GC? --     Constitutional: Alert and oriented. Mild to moderate distress Eyes: Conjunctivae are normal. PERRL. Normal extraocular movements. ENT   Head: Normocephalic and atraumatic.   Nose: No congestion/rhinnorhea.   Mouth/Throat: Mucous membranes are moist.   Neck: No stridor. Cardiovascular: Normal rate, regular rhythm. No murmurs, rubs, or gallops. Respiratory: Tachypnea with rhonchi bilaterally Gastrointestinal: Soft and nontender. Normal bowel sounds Musculoskeletal: Nontender with normal range of motion in all extremities. No lower extremity  tenderness nor edema. Neurologic:  Normal speech and language. No gross focal neurologic deficits are appreciated.  Skin:  Skin is warm, dry and intact. No rash noted. Psychiatric: Mood and affect are normal. Speech and behavior are normal.  ____________________________________________  EKG: Interpreted by me.Ventricular paced rhythm with a rate of 81 bpm. No acute changes are identified.  ____________________________________________  ED COURSE:  Pertinent  labs & imaging results that were available during my care of the patient were reviewed by me and considered in my medical decision making (see chart for details). Patient is in some distress, we will provide additional steroids and breathing treatments. I will obtain x-rays and reevaluate. ____________________________________________    LABS (pertinent positives/negatives)  Labs Reviewed  CBC WITH DIFFERENTIAL/PLATELET - Abnormal; Notable for the following:    WBC 16.3 (*)    RBC 5.70 (*)    HCT 47.6 (*)    RDW 16.2 (*)    Neutro Abs 12.8 (*)    All other components within normal limits  BASIC METABOLIC PANEL - Abnormal; Notable for the following:    Glucose, Bld 116 (*)    All other components within normal limits  BLOOD GAS, VENOUS - Abnormal; Notable for the following:    pCO2, Ven 41 (*)    All other components within normal limits  BRAIN NATRIURETIC PEPTIDE  TROPONIN I    RADIOLOGY Images were viewed by me  Chest x-ray   IMPRESSION: Mild left basilar subsegmental atelectasis. IMPRESSION: No evidence of pulmonary emboli.  Emphysematous changes with mild left lower lobe atelectasis.  The remainder of the study is stable from the previous exam. ____________________________________________  FINAL ASSESSMENT AND PLAN  Dyspnea, COPD  Plan: Patient with labs and imaging as dictated above. Patient states she is not feeling better after nebs and steroids. She has been using same at home with increased oxygen requirements. All of her care is at [facility], patient is requesting transfer to [facility].   Emily Filbert, MD   Emily Filbert, MD 09/17/15 Zollie Pee

## 2015-09-18 MED ORDER — AZITHROMYCIN 250 MG PO TABS
500.0000 mg | ORAL_TABLET | Freq: Every day | ORAL | Status: AC
  Administered 2015-09-18: 500 mg via ORAL
  Filled 2015-09-18: qty 2

## 2015-09-18 MED ORDER — ENSURE ENLIVE PO LIQD
237.0000 mL | Freq: Two times a day (BID) | ORAL | Status: DC
  Administered 2015-09-18 – 2015-09-23 (×9): 237 mL via ORAL

## 2015-09-18 MED ORDER — AZITHROMYCIN 250 MG PO TABS
250.0000 mg | ORAL_TABLET | Freq: Every day | ORAL | Status: AC
  Administered 2015-09-19 – 2015-09-22 (×4): 250 mg via ORAL
  Filled 2015-09-18 (×4): qty 1

## 2015-09-18 MED ORDER — ALBUTEROL SULFATE (2.5 MG/3ML) 0.083% IN NEBU
2.5000 mg | INHALATION_SOLUTION | RESPIRATORY_TRACT | Status: DC
  Administered 2015-09-18 – 2015-09-23 (×30): 2.5 mg via RESPIRATORY_TRACT
  Filled 2015-09-18 (×30): qty 3

## 2015-09-18 NOTE — Progress Notes (Signed)
RT called by Cammy Copa, RN to give stat extra neb per Dr. Amado Coe, patient has no PRN orders.  RT to room to assess patient, patient found talking with dietary.  RT back into room after dietary finished talking with patient.  RT assessed patient, BBS expiratory wheezes, equal to what was heard at 0800 neb tx.  O2 sat 95% on 3LPM [place].  At this time, patient texting on phone, in no distress.  RT asked patient how she felt her breathing was and patient stated improved since Dr. Amado Coe in room, as she was having a "bad spell" at that time.  RT explained to patient, no PRN order in at this time but would be back at 1100, earliest time RT can give 1200 neb.  Patient agreeable and still in no distress.

## 2015-09-18 NOTE — Care Management (Signed)
Admitted to this facility with the diagnosis of acute bronchitis. Lives alone. Son is [name] [phone]. Last seen Dr, Zada Finders yesterday. Chronic home oxygen per [facility] 2 liters per nasal cannula. Works/owner of Primary school teacher. No home health in the past. 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 Pacemaker in place. Uses a cane to aid in ambulation. Takes care of all basic and instrumental activities of daily living herself, drives. Gwenette Greet RN MSN CCM Care Management [phone]

## 2015-09-18 NOTE — Progress Notes (Signed)
[facility] Physicians - [place] at [facility]   PATIENT NAME: Kathleen [name]    MR#:  [id number]  DATE OF BIRTH:  1946/04/01  SUBJECTIVE:  CHIEF COMPLAINT:  Patient's shortness of breath is slightly better but still continues to wheeze  REVIEW OF SYSTEMS:  CONSTITUTIONAL: No fever, fatigue or weakness.  EYES: No blurred or double vision.  EARS, NOSE, AND THROAT: No tinnitus or ear pain.  RESPIRATORY: Reports cough, shortness of breath, wheezing , denies hemoptysis.  CARDIOVASCULAR: No chest pain, orthopnea, edema.  GASTROINTESTINAL: No nausea, vomiting, diarrhea or abdominal pain.  GENITOURINARY: No dysuria, hematuria.  ENDOCRINE: No polyuria, nocturia,  HEMATOLOGY: No anemia, easy bruising or bleeding SKIN: No rash or lesion. MUSCULOSKELETAL: No joint pain or arthritis.   NEUROLOGIC: No tingling, numbness, weakness.  PSYCHIATRY: No anxiety or depression.   DRUG ALLERGIES:   Allergies  Allergen Reactions  . Allopurinol Other (See Comments)    Reaction:  Dizziness   . Clindamycin Anaphylaxis and Hives  . Dronedarone Rash  . Meperidine Nausea And Vomiting  . Penicillins Hives and Other (See Comments)    Has patient had a PCN reaction causing immediate rash, facial/tongue/throat swelling, SOB or lightheadedness with hypotension: No Has patient had a PCN reaction causing severe rash involving mucus membranes or skin necrosis: No Has patient had a PCN reaction that required hospitalization No Has patient had a PCN reaction occurring within the last 10 years: No If all of the above answers are "NO", then may proceed with Cephalosporin use.  . Rosuvastatin Other (See Comments)    Reaction:  Muscle spasms   . Tetracycline Hives  . Adhesive [Tape] Rash  . Lovastatin Rash  . Pneumococcal 13-Val Conj Vacc Itching, Swelling and Rash    VITALS:  Blood pressure 142/67, pulse 81, temperature 98.6 F (37 C), temperature source Oral, resp. rate 20, height 5\' 2"  (1.575  m), weight 95.255 kg (210 lb), SpO2 97 %.  PHYSICAL EXAMINATION:  GENERAL:  70 y.o.-year-old patient lying in the bed with no acute distress.  EYES: Pupils equal, round, reactive to light and accommodation. No scleral icterus. Extraocular muscles intact.  HEENT: Head atraumatic, normocephalic. Oropharynx and nasopharynx clear.  NECK:  Supple, no jugular venous distention. No thyroid enlargement, no tenderness.  LUNGS: Moderate  breath sounds bilaterally, diffuse  wheezing,  No rales,rhonchi or crepitation. No use of accessory muscles of respiration.  CARDIOVASCULAR: S1, S2 normal. No murmurs, rubs, or gallops.  ABDOMEN: Soft, nontender, nondistended. Bowel sounds present. No organomegaly or mass.  EXTREMITIES: No pedal edema, cyanosis, or clubbing.  NEUROLOGIC: Cranial nerves II through XII are intact. Muscle strength 5/5 in all extremities. Sensation intact. Gait not checked.  PSYCHIATRIC: The patient is alert and oriented x 3.  SKIN: No obvious rash, lesion, or ulcer.    LABORATORY PANEL:   CBC  Recent Labs Lab 09/17/15 1628  WBC 16.3*  HGB 16.0  HCT 47.6*  PLT 371   ------------------------------------------------------------------------------------------------------------------  Chemistries   Recent Labs Lab 09/17/15 1628  NA 138  K 3.6  CL 101  CO2 25  GLUCOSE 116*  BUN 20  CREATININE 0.89  CALCIUM 9.5   ------------------------------------------------------------------------------------------------------------------  Cardiac Enzymes  Recent Labs Lab 09/17/15 1628  TROPONINI <0.03   ------------------------------------------------------------------------------------------------------------------  RADIOLOGY:  Dg Chest 2 View  09/17/2015  CLINICAL DATA:  Shortness of breath, cough. EXAM: CHEST  2 VIEW COMPARISON:  September 08, 2015. FINDINGS: Stable cardiomediastinal silhouette. Left-sided pacemaker is unchanged in position. No pneumothorax or significant  pleural effusion is noted. Right lung is clear. Mild left basilar subsegmental atelectasis is noted. Bony thorax is unremarkable. IMPRESSION: Mild left basilar subsegmental atelectasis. Electronically Signed   By: Lupita Raider, M.D.   On: 09/17/2015 16:33   Ct Angio Chest Pe W/cm &/or Wo Cm  09/17/2015  CLINICAL DATA:  Increasing shortness of breath and cough EXAM: CT ANGIOGRAPHY CHEST WITH CONTRAST TECHNIQUE: Multidetector CT imaging of the chest was performed using the standard protocol during bolus administration of intravenous contrast. Multiplanar CT image reconstructions and MIPs were obtained to evaluate the vascular anatomy. CONTRAST:  75 mL Isovue 370. COMPARISON:  06/23/2015 FINDINGS: The lungs are well aerated bilaterally. Diffuse emphysematous changes are identified. No focal infiltrate or sizable effusion is seen. Minimal left lower lobe atelectatic changes are seen. The thoracic inlet is within normal limits. The thoracic aorta and its branches are stable in appearance from the prior exam. Moderate coronary calcifications are seen. The pulmonary artery is well visualized with a normal branching pattern. No filling defects to suggest pulmonary emboli are noted. No sizable hilar or mediastinal adenopathy is seen. The liver demonstrates a few hypodensities similar to that seen on the prior exam. Remainder the upper abdomen is within normal limits. No acute bony abnormality is noted. A pacing device is again seen. Review of the MIP images confirms the above findings. IMPRESSION: No evidence of pulmonary emboli. Emphysematous changes with mild left lower lobe atelectasis. The remainder of the study is stable from the previous exam. Electronically Signed   By: Alcide Clever M.D.   On: 09/17/2015 18:11    EKG:   Orders placed or performed during the hospital encounter of 09/17/15  . ED EKG  . ED EKG  . EKG 12-Lead  . EKG 12-Lead    ASSESSMENT AND PLAN:   * Acute COPD exacerbation with acute  hypoxic respiratory failure -Clinically improving with IV steroids, Antibiotics, continue the same - Scheduled Nebulizers - Inhalers as needed -Wean O2 as tolerated - Consult pulmonary if no improvement  * Pafib Continue patient's anticoagulation Xarelto and rate control medications  * Chronic diastolic CHF Continue home dose of torsemide No pulmonary edema  * DVT prophylaxis Patient is on Xarelto    All the records are reviewed and case discussed with Care Management/Social Workerr. Management plans discussed with the patient, family and they are in agreement.  CODE STATUS: FC  TOTAL TIME TAKING CARE OF THIS PATIENT: 35 minutes.   POSSIBLE D/C IN 2-3  DAYS, DEPENDING ON CLINICAL CONDITION.   Ramonita Lab M.D on 09/18/2015 at 1:41 PM  Between 7am to 6pm - Pager - [phone] After 6pm go to [email or web address] - password [email or web address] [facility]  [facility]  Office  [phone]  CC: Primary care physician; Dione Housekeeper, MD

## 2015-09-18 NOTE — Progress Notes (Signed)
Initial Nutrition Assessment  DOCUMENTATION CODES:   Obesity unspecified  INTERVENTION:   -Cater to pt preferences -Recommend Ensure Enlive po BID, each supplement provides 350 kcal and 20 grams of protein   NUTRITION DIAGNOSIS:   Inadequate oral intake related to poor appetite, acute illness as evidenced by per patient/family report.  GOAL:   Patient will meet greater than or equal to 90% of their needs  MONITOR:   PO intake, Supplement acceptance, Labs, Weight trends, I & O's  REASON FOR ASSESSMENT:   Malnutrition Screening Tool    ASSESSMENT:   Pt admitted difficulty breathing secondary to COPD exacerbation and acute bronchitis. Pt with recent admission one week ago with acute respiratory failure. Per pt with flu just previously as well.  Past Medical History  Diagnosis Date  . Atrial fibrillation (HCC)   . COPD (chronic obstructive pulmonary disease) (HCC)   . Asthma   . Pacemaker   . 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HCC)   . 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HCC)      Diet Order:  Diet Heart Room service appropriate?: Yes; Fluid consistency:: Thin  Pt reports eating breakfast this am and reports eating a [place] tray late yesterday after admission. Pt reports eating a cereal bar yesterday morning and that was all she had prior to admission.  Pt reports having poor appetite for the past 3-4 weeks starting when she got the flu. Pt reports eating 50% of meals, such as half of cereal bar, half of chicken sandwich for lunch and finishing the rest of dinner. Pt reports starting to eat but having early satiety. No supplements PTA. Pt reports no trouble with swallowing.   Medications: reviewed, Solumedrol Labs: reviewed, WDL  Gastrointestinal Profile: Last BM: 09/17/2015   Nutrition-Focused Physical Exam Findings: Nutrition-Focused physical exam completed. Findings are WDL for fat depletion, muscle depletion, and  edema.    Weight Change: Pt reports weight loss of 18lbs in 3 weeks PTA when first diagnosed with the flu. Pt reports weight of 223lbs and 3 weeks later 205lbs on her home scale. Pt reports her weight was much higher at her PCP office outpatient however. Per [facility] weight encounters pt weight recorded on 4/2 actual weight of 210lbs and current weight 208lbs last night on admission (0.9% weight loss in one week) 210lbs this morning.   Skin:  Reviewed, no issues   Height:   Ht Readings from Last 1 Encounters:  09/17/15 5\' 2"  (1.575 m)    Weight:   Wt Readings from Last 1 Encounters:  09/18/15 210 lb (95.255 kg)   Wt Readings from Last 10 Encounters:  09/18/15 210 lb (95.255 kg)  09/08/15 210 lb 14.4 oz (95.664 kg)  07/19/15 218 lb (98.884 kg)  06/23/15 208 lb (94.348 kg)     Ideal Body Weight:  50 kg  BMI:  Body mass index is 38.4 kg/(m^2).  Estimated Nutritional Needs:   Kcal:  1820-2235kcals  Protein:  95-114g protein  Fluid:  >1.8L fluid  EDUCATION NEEDS:   No education needs identified at this time  Leda Quail, RD, LDN Pager [phone] Weekend/On-Call Pager [phone]

## 2015-09-19 LAB — BLOOD GAS, VENOUS
PATIENT TEMPERATURE: 37
pCO2, Ven: 41 mmHg — ABNORMAL LOW (ref 44.0–60.0)
pH, Ven: 7.43 (ref 7.320–7.430)

## 2015-09-19 MED ORDER — ALPRAZOLAM 0.25 MG PO TABS: 0.2500 mg | ORAL_TABLET | Freq: Two times a day (BID) | ORAL | Status: DC | PRN

## 2015-09-19 NOTE — Progress Notes (Signed)
[facility] Physicians - [place] at [facility]   PATIENT NAME: Kathleen [name]    MR#:  [id number]  DATE OF BIRTH:  1946/05/17  SUBJECTIVE:  CHIEF COMPLAINT:  Patient's shortness of breath With minimal exertion and  continues to wheeze,Very anxious today  REVIEW OF SYSTEMS:  CONSTITUTIONAL: No fever, fatigue or weakness.  EYES: No blurred or double vision.  EARS, NOSE, AND THROAT: No tinnitus or ear pain.  RESPIRATORY: Reports cough, shortness of breath, wheezing , denies hemoptysis.  CARDIOVASCULAR: No chest pain, orthopnea, edema.  GASTROINTESTINAL: No nausea, vomiting, diarrhea or abdominal pain.  GENITOURINARY: No dysuria, hematuria.  ENDOCRINE: No polyuria, nocturia,  HEMATOLOGY: No anemia, easy bruising or bleeding SKIN: No rash or lesion. MUSCULOSKELETAL: No joint pain or arthritis.   NEUROLOGIC: No tingling, numbness, weakness.  PSYCHIATRY: No anxiety or depression.   DRUG ALLERGIES:   Allergies  Allergen Reactions  . Allopurinol Other (See Comments)    Reaction:  Dizziness   . Clindamycin Anaphylaxis and Hives  . Dronedarone Rash  . Meperidine Nausea And Vomiting  . Penicillins Hives and Other (See Comments)    Has patient had a PCN reaction causing immediate rash, facial/tongue/throat swelling, SOB or lightheadedness with hypotension: No Has patient had a PCN reaction causing severe rash involving mucus membranes or skin necrosis: No Has patient had a PCN reaction that required hospitalization No Has patient had a PCN reaction occurring within the last 10 years: No If all of the above answers are "NO", then may proceed with Cephalosporin use.  . Rosuvastatin Other (See Comments)    Reaction:  Muscle spasms   . Tetracycline Hives  . Adhesive [Tape] Rash  . Lovastatin Rash  . Pneumococcal 13-Val Conj Vacc Itching, Swelling and Rash    VITALS:  Blood pressure 129/54, pulse 80, temperature 98.3 F (36.8 C), temperature source Oral, resp. rate 20,  height 6' (1.829 m), weight 97.07 kg (214 lb), SpO2 96 %.  PHYSICAL EXAMINATION:  GENERAL:  70 y.o.-year-old patient lying in the bed with no acute distress.  EYES: Pupils equal, round, reactive to light and accommodation. No scleral icterus. Extraocular muscles intact.  HEENT: Head atraumatic, normocephalic. Oropharynx and nasopharynx clear.  NECK:  Supple, no jugular venous distention. No thyroid enlargement, no tenderness.  LUNGS: Moderate  breath sounds bilaterally, diffuse  wheezing,  No rales,rhonchi or crepitation. No use of accessory muscles of respiration.  CARDIOVASCULAR: S1, S2 normal. No murmurs, rubs, or gallops.  ABDOMEN: Soft, nontender, nondistended. Bowel sounds present. No organomegaly or mass.  EXTREMITIES: No pedal edema, cyanosis, or clubbing.  NEUROLOGIC: Cranial nerves II through XII are intact. Muscle strength 5/5 in all extremities. Sensation intact. Gait not checked.  PSYCHIATRIC: The patient is alert and oriented x 3.  SKIN: No obvious rash, lesion, or ulcer.    LABORATORY PANEL:   CBC  Recent Labs Lab 09/17/15 1628  WBC 16.3*  HGB 16.0  HCT 47.6*  PLT 371   ------------------------------------------------------------------------------------------------------------------  Chemistries   Recent Labs Lab 09/17/15 1628  NA 138  K 3.6  CL 101  CO2 25  GLUCOSE 116*  BUN 20  CREATININE 0.89  CALCIUM 9.5   ------------------------------------------------------------------------------------------------------------------  Cardiac Enzymes  Recent Labs Lab 09/17/15 1628  TROPONINI <0.03   ------------------------------------------------------------------------------------------------------------------  RADIOLOGY:  Dg Chest 2 View  09/17/2015  CLINICAL DATA:  Shortness of breath, cough. EXAM: CHEST  2 VIEW COMPARISON:  September 08, 2015. FINDINGS: Stable cardiomediastinal silhouette. Left-sided pacemaker is unchanged in position. No pneumothorax or  significant pleural effusion is noted. Right lung is clear. Mild left basilar subsegmental atelectasis is noted. Bony thorax is unremarkable. IMPRESSION: Mild left basilar subsegmental atelectasis. Electronically Signed   By: Lupita Raider, M.D.   On: 09/17/2015 16:33   Ct Angio Chest Pe W/cm &/or Wo Cm  09/17/2015  CLINICAL DATA:  Increasing shortness of breath and cough EXAM: CT ANGIOGRAPHY CHEST WITH CONTRAST TECHNIQUE: Multidetector CT imaging of the chest was performed using the standard protocol during bolus administration of intravenous contrast. Multiplanar CT image reconstructions and MIPs were obtained to evaluate the vascular anatomy. CONTRAST:  75 mL Isovue 370. COMPARISON:  06/23/2015 FINDINGS: The lungs are well aerated bilaterally. Diffuse emphysematous changes are identified. No focal infiltrate or sizable effusion is seen. Minimal left lower lobe atelectatic changes are seen. The thoracic inlet is within normal limits. The thoracic aorta and its branches are stable in appearance from the prior exam. Moderate coronary calcifications are seen. The pulmonary artery is well visualized with a normal branching pattern. No filling defects to suggest pulmonary emboli are noted. No sizable hilar or mediastinal adenopathy is seen. The liver demonstrates a few hypodensities similar to that seen on the prior exam. Remainder the upper abdomen is within normal limits. No acute bony abnormality is noted. A pacing device is again seen. Review of the MIP images confirms the above findings. IMPRESSION: No evidence of pulmonary emboli. Emphysematous changes with mild left lower lobe atelectasis. The remainder of the study is stable from the previous exam. Electronically Signed   By: Alcide Clever M.D.   On: 09/17/2015 18:11    EKG:   Orders placed or performed during the hospital encounter of 09/17/15  . ED EKG  . ED EKG  . EKG 12-Lead  . EKG 12-Lead    ASSESSMENT AND PLAN:   * Acute COPD  exacerbation with acute hypoxic respiratory failure -Clinically improving with IV steroids, Antibiotics, continue the same - Scheduled Nebulizers - Inhalers as needed -Wean O2 as tolerated - Consult pulmonary if no improvement  * Pafib Continue patient's anticoagulation Xarelto and rate control medications  * Chronic diastolic CHF Continue home dose of torsemide No pulmonary edema   * Anxiety Xanax prn   * DVT prophylaxis Patient is on Xarelto    All the records are reviewed and case discussed with Care Management/Social Workerr. Management plans discussed with the patient, family and they are in agreement.  CODE STATUS: FC  TOTAL TIME TAKING CARE OF THIS PATIENT: 35 minutes.   POSSIBLE D/C IN 2-3  DAYS, DEPENDING ON CLINICAL CONDITION.   Ramonita Lab M.D on 09/19/2015 at 2:58 PM  Between 7am to 6pm - Pager - [phone] After 6pm go to [email or web address] - password [email or web address] [facility]  [place] [facility]  Office  [phone]  CC: Primary care physician; Dione Housekeeper, MD

## 2015-09-19 NOTE — Care Management Important Message (Signed)
Important Message  Patient Details  Name: FELMA SANTOSO MRN: [id number] Date of Birth: [birth year]   Medicare Important Message Given:  Yes    Olegario Messier A Chabeli Barsamian 09/19/2015, 1:32 PM

## 2015-09-20 ENCOUNTER — Inpatient Hospital Stay: Payer: Medicare Other

## 2015-09-20 LAB — CBC
HCT: 40.4 % (ref 35.0–47.0)
HEMOGLOBIN: 13.3 g/dL (ref 12.0–16.0)
MCH: 27.8 pg (ref 26.0–34.0)
MCHC: 32.9 g/dL (ref 32.0–36.0)
MCV: 84.7 fL (ref 80.0–100.0)
PLATELETS: 239 10*3/uL (ref 150–440)
RBC: 4.77 MIL/uL (ref 3.80–5.20)
RDW: 16.1 % — ABNORMAL HIGH (ref 11.5–14.5)
WBC: 17.3 10*3/uL — AB (ref 3.6–11.0)

## 2015-09-20 LAB — CREATININE, SERUM: CREATININE: 0.83 mg/dL (ref 0.44–1.00)

## 2015-09-20 LAB — URIC ACID: URIC ACID, SERUM: 8.3 mg/dL — AB (ref 2.3–6.6)

## 2015-09-20 MED ORDER — GUAIFENESIN-DM 100-10 MG/5ML PO SYRP
10.0000 mL | ORAL_SOLUTION | ORAL | Status: DC | PRN
  Administered 2015-09-21 – 2015-09-23 (×2): 10 mL via ORAL
  Filled 2015-09-20 (×2): qty 10

## 2015-09-20 MED ORDER — LORATADINE 10 MG PO TABS
10.0000 mg | ORAL_TABLET | Freq: Every day | ORAL | Status: DC
  Administered 2015-09-20 – 2015-09-23 (×4): 10 mg via ORAL
  Filled 2015-09-20 (×4): qty 1

## 2015-09-20 NOTE — Progress Notes (Signed)
[facility] Physicians - [place] at [facility]   PATIENT NAME: Kathleen [name]    MR#:  [id number]  DATE OF BIRTH:  May 26, 1946  SUBJECTIVE:  CHIEF COMPLAINT:  Patient's shortness of breath is better reporting nasal congesion and   improved wheeze,Very anxious today  REVIEW OF SYSTEMS:  CONSTITUTIONAL: No fever, fatigue or weakness.  EYES: No blurred or double vision.  EARS, NOSE, AND THROAT: No tinnitus or ear pain.  RESPIRATORY: Reports cough, shortness of breath, wheezing , denies hemoptysis.  CARDIOVASCULAR: No chest pain, orthopnea, edema.  GASTROINTESTINAL: No nausea, vomiting, diarrhea or abdominal pain.  GENITOURINARY: No dysuria, hematuria.  ENDOCRINE: No polyuria, nocturia,  HEMATOLOGY: No anemia, easy bruising or bleeding SKIN: No rash or lesion. MUSCULOSKELETAL: No joint pain or arthritis.   NEUROLOGIC: No tingling, numbness, weakness.  PSYCHIATRY: No anxiety or depression.   DRUG ALLERGIES:   Allergies  Allergen Reactions  . Allopurinol Other (See Comments)    Reaction:  Dizziness   . Clindamycin Anaphylaxis and Hives  . Dronedarone Rash  . Meperidine Nausea And Vomiting  . Penicillins Hives and Other (See Comments)    Has patient had a PCN reaction causing immediate rash, facial/tongue/throat swelling, SOB or lightheadedness with hypotension: No Has patient had a PCN reaction causing severe rash involving mucus membranes or skin necrosis: No Has patient had a PCN reaction that required hospitalization No Has patient had a PCN reaction occurring within the last 10 years: No If all of the above answers are "NO", then may proceed with Cephalosporin use.  . Rosuvastatin Other (See Comments)    Reaction:  Muscle spasms   . Tetracycline Hives  . Adhesive [Tape] Rash  . Lovastatin Rash  . Pneumococcal 13-Val Conj Vacc Itching, Swelling and Rash    VITALS:  Blood pressure 128/56, pulse 81, temperature 98.7 F (37.1 C), temperature source Oral, resp.  rate 20, height 6' (1.829 m), weight 96.435 kg (212 lb 9.6 oz), SpO2 93 %.  PHYSICAL EXAMINATION:  GENERAL:  70 y.o.-year-old patient lying in the bed with no acute distress.  EYES: Pupils equal, round, reactive to light and accommodation. No scleral icterus. Extraocular muscles intact.  HEENT: Head atraumatic, normocephalic. Oropharynx and nasopharynx clear.  NECK:  Supple, no jugular venous distention. No thyroid enlargement, no tenderness.  LUNGS: Moderate  breath sounds bilaterally, diffuse  minmal wheezing,  No rales,rhonchi or crepitation. No use of accessory muscles of respiration.  CARDIOVASCULAR: S1, S2 normal. No murmurs, rubs, or gallops.  ABDOMEN: Soft, nontender, nondistended. Bowel sounds present. No organomegaly or mass.  EXTREMITIES: No pedal edema, cyanosis, or clubbing.  NEUROLOGIC: Cranial nerves II through XII are intact. Muscle strength 5/5 in all extremities. Sensation intact. Gait not checked.  PSYCHIATRIC: The patient is alert and oriented x 3.  SKIN: No obvious rash, lesion, or ulcer.    LABORATORY PANEL:   CBC  Recent Labs Lab 09/20/15 0452  WBC 17.3*  HGB 13.3  HCT 40.4  PLT 239   ------------------------------------------------------------------------------------------------------------------  Chemistries   Recent Labs Lab 09/17/15 1628 09/20/15 0452  NA 138  --   K 3.6  --   CL 101  --   CO2 25  --   GLUCOSE 116*  --   BUN 20  --   CREATININE 0.89 0.83  CALCIUM 9.5  --    ------------------------------------------------------------------------------------------------------------------  Cardiac Enzymes  Recent Labs Lab 09/17/15 1628  TROPONINI <0.03   ------------------------------------------------------------------------------------------------------------------  RADIOLOGY:  No results found.  EKG:   Orders  placed or performed during the hospital encounter of 09/17/15  . ED EKG  . ED EKG  . EKG 12-Lead  . EKG 12-Lead     ASSESSMENT AND PLAN:   * Acute COPD exacerbation with acute hypoxic respiratory failure -Clinically improving with IV steroids, Antibiotics, continue the same - Scheduled Nebulizers - Inhalers as needed -Wean O2 as tolerated - Consult pulmonary if no improvement - repeat chest x-ray - incente spirometry, chest PT  * Pafib Continue patient's anticoagulation Xarelto and rate control medications  * Chronic diastolic CHF Continue home dose of torsemide No pulmonary edema   * Anxiety Xanax prn   * DVT prophylaxis Patient is on Xarelto    All the records are reviewed and case discussed with Care Management/Social Workerr. Management plans discussed with the patient, family and they are in agreement.  CODE STATUS: FC  TOTAL TIME TAKING CARE OF THIS PATIENT: 35 minutes.   POSSIBLE D/C IN 1-2  DAYS, DEPENDING ON CLINICAL CONDITION.   Ramonita Lab M.D on 09/20/2015 at 3:22 PM  Between 7am to 6pm - Pager - [phone] After 6pm go to [email or web address] - password [email or web address] [facility]  [place] [facility]  Office  [phone]  CC: Primary care physician; Dione Housekeeper, MD

## 2015-09-21 MED ORDER — PREDNISONE 50 MG PO TABS
50.0000 mg | ORAL_TABLET | Freq: Every day | ORAL | Status: DC
  Administered 2015-09-22 – 2015-09-23 (×2): 50 mg via ORAL
  Filled 2015-09-21 (×2): qty 1

## 2015-09-21 NOTE — Evaluation (Signed)
Physical Therapy Evaluation Patient Details Name: TAKYAH CIARAMITARO MRN: [id number] DOB: [birth year] Today's Date: 09/21/2015   History of Present Illness  Kathleen [name] is a 70yo white female who comes to [facility] 4/11 after worsening SOB, and cough: c EMS pt found to have 85% SaO2 on 4L. Pt was admitted for PNA 1WA. CT imaging this visit is negative for PE, PNA. Pt is on RA during the day and 2L at night.   Clinical Impression  Patient able to perform household distance mobility with SPC in RUE and 1L/min O2 at supervision, with decreased gait speed (.75ms) and mild gait instability. Pt reports she has not been about to return to her PLOF due to multiple contiguous illnesses and prolonged respiratory distress. She feels that her balance is the greatest limiter at this point in time. The above impairments limiting indep in ADL, IADL, and functional mobility within the home and community. Pt will benefit from skilled PT intervention to address the above deficits in order to restore patient to PLOF and improve safety for return to home. Pt has adequate equipment, social support, and awareness of limitations. Pt monitors her SaO2 at home. All additional PT needs can be met p DC. Pt was asked to set up assistance with groceries and meals over the next week.      Follow Up Recommendations Home health PT    Equipment Recommendations  None recommended by PT    Recommendations for Other Services       Precautions / Restrictions Precautions Precautions: None      Mobility  Bed Mobility Overal bed mobility: Independent             General bed mobility comments: Supine to/from sit with HOB elevated  Transfers Overall transfer level: Independent Equipment used: None Transfers: Sit to/from Stand Sit to Stand: Independent            Ambulation/Gait Ambulation/Gait assistance: Supervision Ambulation Distance (Feet): 200 Feet Assistive device: Straight cane Gait Pattern/deviations:  Step-to pattern Gait velocity: .341m Gait velocity interpretation: <1.8 ft/sec, indicative of risk for recurrent falls General Gait Details: very slow, unsteady, DOE increaseing to 5/10, able to perfrom on 1L/min at 90% SaO2.   Stairs            Wheelchair Mobility    Modified Rankin (Stroke Patients Only)       Balance Overall balance assessment: Modified Independent (pt reports her balance is more unsteady than baseline. )                                           Pertinent Vitals/Pain Pain Assessment: No/denies pain    Home Living Family/patient expects to be discharged to:: Private residence Living Arrangements: Alone     Home Access: Stairs to enter Entrance Stairs-Rails: Left Entrance Stairs-Number of Steps: 3-4 tall steps Home Layout: One level Home Equipment: Cane - single point;Walker - 4 wheels      Prior Function Level of Independence: Independent with assistive device(s)         Comments: Uses SPC outside/in community.  Denies any h/o falls.     Hand Dominance        Extremity/Trunk Assessment               Lower Extremity Assessment: Generalized weakness;Overall [facility] Center[facility] tasks assessed         Communication   Communication:  No difficulties  Cognition Arousal/Alertness: Awake/alert Behavior During Therapy: WFL for tasks assessed/performed Overall Cognitive Status: Within Functional Limits for tasks assessed                      General Comments      Exercises        Assessment/Plan    PT Assessment All further PT needs can be met in the next venue of care  PT Diagnosis Generalized weakness;Abnormality of gait   PT Problem List Decreased activity tolerance;Cardiopulmonary status limiting activity;Decreased balance;Decreased mobility;Obesity  PT Treatment Interventions     PT Goals (Current goals can be found in the Care Plan section) Acute Rehab PT Goals PT Goal Formulation: All assessment  and education complete, DC therapy    Frequency     Barriers to discharge        Co-evaluation               End of Session Equipment Utilized During Treatment: Gait belt;Oxygen Activity Tolerance: Patient tolerated treatment well;Patient limited by fatigue;No increased pain Patient left: in chair;with call bell/phone within reach Nurse Communication: Mobility status;Other (comment)         Time: 7253-6644 PT Time Calculation (min) (ACUTE ONLY): 20 min   Charges:   PT Evaluation $PT Eval Low Complexity: 1 Procedure PT Treatments $Therapeutic Activity: 8-22 mins   PT G Codes:        12:30 PM, 26-Sep-2015 [name], PT, DPT PRN Physical Therapist - [place] License # [id number] [phone] (mobile)

## 2015-09-21 NOTE — Progress Notes (Signed)
[facility] Physicians - [place] at [facility]   PATIENT NAME: Kathleen [name]    MR#:  [id number]  DATE OF BIRTH:  [birth year]  SUBJECTIVE:  CHIEF COMPLAINT:  Patient's shortness of breath is better while resting but reporting exertional dyspnea  nasal co, improvedngesion and   improved wheeze.  REVIEW OF SYSTEMS:  CONSTITUTIONAL: No fever, fatigue or weakness.  EYES: No blurred or double vision.  EARS, NOSE, AND THROAT: No tinnitus or ear pain.  RESPIRATORY: Reports cough,  denies shortness of breath, wheezing , denies hemoptysis.  CARDIOVASCULAR: No chest pain, orthopnea, edema.  GASTROINTESTINAL: No nausea, vomiting, diarrhea or abdominal pain.  GENITOURINARY: No dysuria, hematuria.  ENDOCRINE: No polyuria, nocturia,  HEMATOLOGY: No anemia, easy bruising or bleeding SKIN: No rash or lesion. MUSCULOSKELETAL: No joint pain or arthritis.   NEUROLOGIC: No tingling, numbness, weakness.  PSYCHIATRY: No anxiety or depression.   DRUG ALLERGIES:   Allergies  Allergen Reactions  . Allopurinol Other (See Comments)    Reaction:  Dizziness   . Clindamycin Anaphylaxis and Hives  . Dronedarone Rash  . Meperidine Nausea And Vomiting  . Penicillins Hives and Other (See Comments)    Has patient had a PCN reaction causing immediate rash, facial/tongue/throat swelling, SOB or lightheadedness with hypotension: No Has patient had a PCN reaction causing severe rash involving mucus membranes or skin necrosis: No Has patient had a PCN reaction that required hospitalization No Has patient had a PCN reaction occurring within the last 10 years: No If all of the above answers are "NO", then may proceed with Cephalosporin use.  . Rosuvastatin Other (See Comments)    Reaction:  Muscle spasms   . Tetracycline Hives  . Adhesive [Tape] Rash  . Lovastatin Rash  . Pneumococcal 13-Val Conj Vacc Itching, Swelling and Rash    VITALS:  Blood pressure 110/58, pulse 79, temperature 97.9 F  (36.6 C), temperature source Oral, resp. rate 18, height 6' (1.829 m), weight 95.346 kg (210 lb 3.2 oz), SpO2 96 %.  PHYSICAL EXAMINATION:  GENERAL:  70 y.o.-year-old patient lying in the bed with no acute distress.  EYES: Pupils equal, round, reactive to light and accommodation. No scleral icterus. Extraocular muscles intact.  HEENT: Head atraumatic, normocephalic. Oropharynx and nasopharynx clear.  NECK:  Supple, no jugular venous distention. No thyroid enlargement, no tenderness.  LUNGS: Moderate  breath sounds bilaterally, no wheezing,  No rales,rhonchi or crepitation. No use of accessory muscles of respiration.  CARDIOVASCULAR: S1, S2 normal. No murmurs, rubs, or gallops.  ABDOMEN: Soft, nontender, nondistended. Bowel sounds present. No organomegaly or mass.  EXTREMITIES: No pedal edema, cyanosis, or clubbing.  NEUROLOGIC: Cranial nerves II through XII are intact. Muscle strength 5/5 in all extremities. Sensation intact. Gait not checked.  PSYCHIATRIC: The patient is alert and oriented x 3.  SKIN: No obvious rash, lesion, or ulcer.    LABORATORY PANEL:   CBC  Recent Labs Lab 09/20/15 0452  WBC 17.3*  HGB 13.3  HCT 40.4  PLT 239   ------------------------------------------------------------------------------------------------------------------  Chemistries   Recent Labs Lab 09/17/15 1628 09/20/15 0452  NA 138  --   K 3.6  --   CL 101  --   CO2 25  --   GLUCOSE 116*  --   BUN 20  --   CREATININE 0.89 0.83  CALCIUM 9.5  --    ------------------------------------------------------------------------------------------------------------------  Cardiac Enzymes  Recent Labs Lab 09/17/15 1628  TROPONINI <0.03   ------------------------------------------------------------------------------------------------------------------  RADIOLOGY:  Dg Chest 2  View  09/20/2015  CLINICAL DATA:  Shortness of Breath EXAM: CHEST  2 VIEW COMPARISON:  09/17/2015 FINDINGS: Cardiac  shadow is mildly enlarged. A pacing device is again noted. Lungs are mildly hyperinflated consistent with COPD. Bony structures are stable from the prior exam. IMPRESSION: COPD without acute abnormality. Electronically Signed   By: Alcide Clever M.D.   On: 09/20/2015 16:04    EKG:   Orders placed or performed during the hospital encounter of 09/17/15  . ED EKG  . ED EKG  . EKG 12-Lead  . EKG 12-Lead    ASSESSMENT AND PLAN:   * Acute COPD exacerbation with acute hypoxic respiratory failure -Clinically improving with IV steroids,taper to by mouth prednisone,  Antibiotics, continue the same - Scheduled Nebulizers - Inhalers as needed -Wean O2 as tolerated - Consult pulmonary if no improvement - repeat chest x-ray - incente spirometry, chest PT   * Pafib Continue patient's anticoagulation Xarelto and rate control medications  * Chronic diastolic CHF Continue home dose of torsemide No pulmonary edema   * Anxiety Xanax prn   * DVT prophylaxis Patient is on Xarelto  *PT consult is pending   All the records are reviewed and case discussed with Care Management/Social Workerr. Management plans discussed with the patient,  and son and they are in agreement.  CODE STATUS: FC  TOTAL TIME TAKING CARE OF THIS PATIENT: 35 minutes.   POSSIBLE D/C IN 1-2  DAYS, DEPENDING ON CLINICAL CONDITION.   Ramonita Lab M.D on 09/21/2015 at 12:17 PM  Between 7am to 6pm - Pager - [phone] After 6pm go to [email or web address] - password [email or web address] [facility]  [facility]  Office  [phone]  CC: Primary care physician; Dione Housekeeper, MD

## 2015-09-22 NOTE — Plan of Care (Signed)
Problem: Health Behavior/Discharge Planning: Goal: Ability to manage health-related needs will improve Outcome: Progressing Pt is ambulating but continues to be SOB with any exertion  Problem: Activity: Goal: Risk for activity intolerance will decrease Outcome: Not Progressing Pt continues to have dyspnea with any activity.

## 2015-09-22 NOTE — Progress Notes (Signed)
[facility] Physicians - [place] at [facility]   PATIENT NAME: Kathleen [name]    MR#:  [id number]  DATE OF BIRTH:  March 12, 1946  SUBJECTIVE:  CHIEF COMPLAINT:  Patient's shortness of breath is better while resting but reporting exertional dyspnea  , became hypoxic to 83% on 2 L of oxygen with ambulation today . Otherwise feeling better  and   improved wheeze.  REVIEW OF SYSTEMS:  CONSTITUTIONAL: No fever, fatigue or weakness.  EYES: No blurred or double vision.  EARS, NOSE, AND THROAT: No tinnitus or ear pain.  RESPIRATORY: Reports cough,  denies shortness of breath, wheezing , denies hemoptysis.  CARDIOVASCULAR: No chest pain, orthopnea, edema.  GASTROINTESTINAL: No nausea, vomiting, diarrhea or abdominal pain.  GENITOURINARY: No dysuria, hematuria.  ENDOCRINE: No polyuria, nocturia,  HEMATOLOGY: No anemia, easy bruising or bleeding SKIN: No rash or lesion. MUSCULOSKELETAL: No joint pain or arthritis.   NEUROLOGIC: No tingling, numbness, weakness.  PSYCHIATRY: No anxiety or depression.   DRUG ALLERGIES:   Allergies  Allergen Reactions  . Allopurinol Other (See Comments)    Reaction:  Dizziness   . Clindamycin Anaphylaxis and Hives  . Dronedarone Rash  . Meperidine Nausea And Vomiting  . Penicillins Hives and Other (See Comments)    Has patient had a PCN reaction causing immediate rash, facial/tongue/throat swelling, SOB or lightheadedness with hypotension: No Has patient had a PCN reaction causing severe rash involving mucus membranes or skin necrosis: No Has patient had a PCN reaction that required hospitalization No Has patient had a PCN reaction occurring within the last 10 years: No If all of the above answers are "NO", then may proceed with Cephalosporin use.  . Rosuvastatin Other (See Comments)    Reaction:  Muscle spasms   . Tetracycline Hives  . Adhesive [Tape] Rash  . Lovastatin Rash  . Pneumococcal 13-Val Conj Vacc Itching, Swelling and Rash     VITALS:  Blood pressure 133/71, pulse 90, temperature 97.6 F (36.4 C), temperature source Oral, resp. rate 17, height 6' (1.829 m), weight 95.709 kg (211 lb), SpO2 96 %.  PHYSICAL EXAMINATION:  GENERAL:  70 y.o.-year-old patient lying in the bed with no acute distress.  EYES: Pupils equal, round, reactive to light and accommodation. No scleral icterus. Extraocular muscles intact.  HEENT: Head atraumatic, normocephalic. Oropharynx and nasopharynx clear.  NECK:  Supple, no jugular venous distention. No thyroid enlargement, no tenderness.  LUNGS: Moderate  breath sounds bilaterally, no wheezing,  No rales,rhonchi or crepitation. No use of accessory muscles of respiration.  CARDIOVASCULAR: S1, S2 normal. No murmurs, rubs, or gallops.  ABDOMEN: Soft, nontender, nondistended. Bowel sounds present. No organomegaly or mass.  EXTREMITIES: No pedal edema, cyanosis, or clubbing.  NEUROLOGIC: Cranial nerves II through XII are intact. Muscle strength 5/5 in all extremities. Sensation intact. Gait not checked.  PSYCHIATRIC: The patient is alert and oriented x 3.  SKIN: No obvious rash, lesion, or ulcer.    LABORATORY PANEL:   CBC  Recent Labs Lab 09/20/15 0452  WBC 17.3*  HGB 13.3  HCT 40.4  PLT 239   ------------------------------------------------------------------------------------------------------------------  Chemistries   Recent Labs Lab 09/17/15 1628 09/20/15 0452  NA 138  --   K 3.6  --   CL 101  --   CO2 25  --   GLUCOSE 116*  --   BUN 20  --   CREATININE 0.89 0.83  CALCIUM 9.5  --    ------------------------------------------------------------------------------------------------------------------  Cardiac Enzymes  Recent Labs Lab 09/17/15  1628  TROPONINI <0.03   ------------------------------------------------------------------------------------------------------------------  RADIOLOGY:  Dg Chest 2 View  09/20/2015  CLINICAL DATA:  Shortness of Breath  EXAM: CHEST  2 VIEW COMPARISON:  09/17/2015 FINDINGS: Cardiac shadow is mildly enlarged. A pacing device is again noted. Lungs are mildly hyperinflated consistent with COPD. Bony structures are stable from the prior exam. IMPRESSION: COPD without acute abnormality. Electronically Signed   By: Alcide Clever M.D.   On: 09/20/2015 16:04    EKG:   Orders placed or performed during the hospital encounter of 09/17/15  . ED EKG  . ED EKG  . EKG 12-Lead  . EKG 12-Lead    ASSESSMENT AND PLAN:   * Acute COPD exacerbation with acute hypoxic respiratory failure -Patient is deconditioning as she desatted to 83% on 2 L with ambulation. Encouraged patient to ambulate in the hallway each shift -Lives on chronic home O2 2 L -Incentive spirometry and chest physical therapy -Clinically improving with IV steroids,taper to by mouth prednisone,  Antibiotics, continue the same - Scheduled Nebulizers - Inhalers as needed -Wean O2 as tolerated - Consult pulmonary if no improvement - repeat chest x-ray - incente spirometry, chest PT   * Pafib Continue patient's anticoagulation Xarelto and rate control medications  * Chronic diastolic CHF Continue home dose of torsemide No pulmonary edema   * Anxiety Xanax prn   * DVT prophylaxis Patient is on Xarelto  *PT consult is pending   All the records are reviewed and case discussed with Care Management/Social Workerr. Management plans discussed with the patient,  and son and they are in agreement.  CODE STATUS: FC  TOTAL TIME TAKING CARE OF THIS PATIENT: 35 minutes.   POSSIBLE D/C IN a.m. DAYS, DEPENDING ON CLINICAL CONDITION.   Ramonita Lab M.D on 09/22/2015 at 12:49 PM  Between 7am to 6pm - Pager - [phone] After 6pm go to [email or web address] - password [email or web address] [facility]  [facility]  Office  [phone]  CC: Primary care physician; Dione Housekeeper, MD

## 2015-09-23 LAB — CBC
HCT: 42.8 % (ref 35.0–47.0)
HEMOGLOBIN: 14.3 g/dL (ref 12.0–16.0)
MCH: 28.2 pg (ref 26.0–34.0)
MCHC: 33.4 g/dL (ref 32.0–36.0)
MCV: 84.3 fL (ref 80.0–100.0)
PLATELETS: 207 10*3/uL (ref 150–440)
RBC: 5.07 MIL/uL (ref 3.80–5.20)
RDW: 16.1 % — ABNORMAL HIGH (ref 11.5–14.5)
WBC: 18.5 10*3/uL — ABNORMAL HIGH (ref 3.6–11.0)

## 2015-09-23 LAB — CREATININE, SERUM
CREATININE: 0.9 mg/dL (ref 0.44–1.00)
GFR calc Af Amer: >60 mL/min (ref >60)

## 2015-09-23 MED ORDER — PREDNISONE 10 MG (21) PO TBPK: 10.0000 mg | ORAL_TABLET | Freq: Every day | ORAL | Status: DC

## 2015-09-23 MED ORDER — LORATADINE 10 MG PO TABS: 10.0000 mg | ORAL_TABLET | Freq: Every day | ORAL | Status: DC

## 2015-09-23 MED ORDER — GUAIFENESIN-DM 100-10 MG/5ML PO SYRP: 10.0000 mL | ORAL_SOLUTION | ORAL | Status: DC | PRN

## 2015-09-23 MED ORDER — ALBUTEROL SULFATE (2.5 MG/3ML) 0.083% IN NEBU: 2.5000 mg | INHALATION_SOLUTION | Freq: Four times a day (QID) | RESPIRATORY_TRACT | Status: DC | PRN

## 2015-09-23 MED ORDER — ALPRAZOLAM 0.25 MG PO TABS: 0.2500 mg | ORAL_TABLET | Freq: Two times a day (BID) | ORAL | Status: DC | PRN

## 2015-09-23 NOTE — Discharge Summary (Signed)
[facility] - [place] at [facility]   PATIENT NAME: Kathleen [name]    MR#:  [id number]  DATE OF BIRTH:  16-Sep-1945  DATE OF ADMISSION:  09/17/2015 ADMITTING PHYSICIAN: Milagros Loll, MD  DATE OF DISCHARGE: 09/23/15 PRIMARY CARE PHYSICIAN: Dione Housekeeper, MD    ADMISSION DIAGNOSIS:  Dyspnea [R06.00] Chronic obstructive pulmonary disease with acute exacerbation (HCC) [J44.1]  DISCHARGE DIAGNOSIS:  Acute on chronic hypoxic respiratory failure Acute COPD exacerbation Chronic atrial fibrillation Anxiety  SECONDARY DIAGNOSIS:   Past Medical History  Diagnosis Date  . Atrial fibrillation (HCC)   . COPD (chronic obstructive pulmonary disease) (HCC)   . Asthma   . Pacemaker   . 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HCC)   . BOOP (bronchiolitis obliterans with organizing pneumonia) [facility])     HOSPITAL COURSE:  * Acute COPD exacerbation with acute hypoxic respiratory failure -Patient is doing fine-Lives on chronic home O2 2 liters during bed time , now requiring continuous 2 L of oxygen via nasal cannula -Incentive spirometry and chest physical therapy provided during the hospital course. Continue incentive spirometry -Clinically improving with IV steroids,taper to by mouth prednisone, Antibiotics, continue the same - Scheduled Nebulizers - Inhalers as needed - Consult pulmonary if no improvement -  * Pafib Continue patient's anticoagulation Xarelto and rate control medications  * Chronic diastolic CHF Continue home dose of torsemide No pulmonary edema   * Anxiety Xanax prn   * DVT prophylaxis Patient is on Xarelto  *PT consult is recommending home health, social workeR WILL  arrange home health PT RN   DISCHARGE CONDITIONS:   FAIR  CONSULTS OBTAINED:      PROCEDURES none  DRUG ALLERGIES:   Allergies  Allergen Reactions  . Allopurinol Other (See Comments)    Reaction:  Dizziness   . Clindamycin  Anaphylaxis and Hives  . Dronedarone Rash  . Meperidine Nausea And Vomiting  . Penicillins Hives and Other (See Comments)    Has patient had a PCN reaction causing immediate rash, facial/tongue/throat swelling, SOB or lightheadedness with hypotension: No Has patient had a PCN reaction causing severe rash involving mucus membranes or skin necrosis: No Has patient had a PCN reaction that required hospitalization No Has patient had a PCN reaction occurring within the last 10 years: No If all of the above answers are "NO", then may proceed with Cephalosporin use.  . Rosuvastatin Other (See Comments)    Reaction:  Muscle spasms   . Tetracycline Hives  . Adhesive [Tape] Rash  . Lovastatin Rash  . Pneumococcal 13-Val Conj Vacc Itching, Swelling and Rash    DISCHARGE MEDICATIONS:   Current Discharge Medication List    START taking these medications   Details  ALPRAZolam (XANAX) 0.25 MG tablet Take 1 tablet (0.25 mg total) by mouth 2 (two) times daily as needed for anxiety. Qty: 30 tablet, Refills: 0    guaiFENesin-dextromethorphan (ROBITUSSIN DM) 100-10 MG/5ML syrup Take 10 mLs by mouth every 4 (four) hours as needed for cough. Qty: 118 mL, Refills: 0    loratadine (CLARITIN) 10 MG tablet Take 1 tablet (10 mg total) by mouth daily.    predniSONE (STERAPRED UNI-PAK 21 TAB) 10 MG (21) TBPK tablet Take 1 tablet (10 mg total) by mouth daily. Take 6 tablets by mouth for 1 day followed by  5 tablets by mouth for 1 day followed by  4 tablets by mouth for 1 day followed by  3 tablets by mouth for 1 day followed  by  2 tablets by mouth for 1 day followed by  1 tablet by mouth for a day and stop Qty: 21 tablet, Refills: 0      CONTINUE these medications which have CHANGED   Details  albuterol (PROVENTIL) (2.5 MG/3ML) 0.083% nebulizer solution Take 3 mLs (2.5 mg total) by nebulization every 6 (six) hours as needed for wheezing or shortness of breath. Qty: 60 vial, Refills: 0      CONTINUE  these medications which have NOT CHANGED   Details  albuterol (PROVENTIL HFA;VENTOLIN HFA) 108 (90 Base) MCG/ACT inhaler Inhale 2 puffs into the lungs every 4 (four) hours as needed for wheezing or shortness of breath.    benzonatate (TESSALON) 200 MG capsule Take 200 mg by mouth 3 (three) times daily as needed for cough.     budesonide-formoterol (SYMBICORT) 160-4.5 MCG/ACT inhaler Inhale 2 puffs into the lungs 2 (two) times daily.    cholecalciferol (VITAMIN D) 1000 units tablet Take 2,000 Units by mouth at bedtime.    colchicine 0.6 MG tablet Take 0.6-1.2 mg by mouth daily as needed (for gout flares).    febuxostat (ULORIC) 40 MG tablet Take 40 mg by mouth daily.    fluticasone (FLONASE) 50 MCG/ACT nasal spray Place 1 spray into both nostrils daily.     levothyroxine (SYNTHROID, LEVOTHROID) 75 MCG tablet Take 75 mcg by mouth daily before breakfast.  Refills: 2    montelukast (SINGULAIR) 10 MG tablet Take 10 mg by mouth at bedtime.    rivaroxaban (XARELTO) 20 MG TABS tablet Take 20 mg by mouth daily with supper.     spironolactone (ALDACTONE) 50 MG tablet Take 75 mg by mouth daily.    tiotropium (SPIRIVA) 18 MCG inhalation capsule Place 18 mcg into inhaler and inhale daily.    torsemide (DEMADEX) 20 MG tablet Take 20 mg by mouth daily.    guaiFENesin (MUCINEX) 600 MG 12 hr tablet Take 1 tablet (600 mg total) by mouth 2 (two) times daily. Qty: 14 tablet, Refills: 0    oxyCODONE-acetaminophen (ROXICET) 5-325 MG tablet Take 1 tablet by mouth every 6 (six) hours as needed. Qty: 20 tablet, Refills: 0      STOP taking these medications     guaiFENesin-codeine 100-10 MG/5ML syrup      cefUROXime (CEFTIN) 500 MG tablet          DISCHARGE INSTRUCTIONS:   Continue oxygen 2 L via nasal cannula Activity as tolerated Diet cardiac Follow-up with primary care physician in a week Follow-up with Cardiology in a week  DIET:  Cardiac diet  DISCHARGE CONDITION:   Fair  ACTIVITY:  Activity as tolerated with home health  OXYGEN:  Home Oxygen: Yes.     Oxygen Delivery: 2 liters/min via Patient connected to nasal cannula oxygen  DISCHARGE LOCATION:  home   If you experience worsening of your admission symptoms, develop shortness of breath, life threatening emergency, suicidal or homicidal thoughts you must seek medical attention immediately by calling 911 or calling your MD immediately  if symptoms less severe.  You Must read complete instructions/literature along with all the possible adverse reactions/side effects for all the Medicines you take and that have been prescribed to you. Take any new Medicines after you have completely understood and accpet all the possible adverse reactions/side effects.   Please note  You were cared for by a hospitalist during your hospital stay. If you have any questions about your discharge medications or the care you received while you were  in the hospital after you are discharged, you can call the unit and asked to speak with the hospitalist on call if the hospitalist that took care of you is not available. Once you are discharged, your primary care physician will handle any further medical issues. Please note that NO REFILLS for any discharge medications will be authorized once you are discharged, as it is imperative that you return to your primary care physician (or establish a relationship with a primary care physician if you do not have one) for your aftercare needs so that they can reassess your need for medications and monitor your lab values.     Today  Chief Complaint  Patient presents with  . Shortness of Breath   Patient is resting comfortably. Shortness of breath significantly improved. Denies any wheezing. Ambulated in the hallway with 2 L of oxygen without shortness of breath or hypoxia or tachycardia  ROS:  CONSTITUTIONAL: Denies fevers, chills. Denies any fatigue, weakness.  EYES: Denies blurry  vision, double vision, eye pain. EARS, NOSE, THROAT: Denies tinnitus, ear pain, hearing loss. RESPIRATORY: Denies cough, wheeze, shortness of breath.  CARDIOVASCULAR: Denies chest pain, palpitations, edema.  GASTROINTESTINAL: Denies nausea, vomiting, diarrhea, abdominal pain. Denies bright red blood per rectum. GENITOURINARY: Denies dysuria, hematuria. ENDOCRINE: Denies nocturia or thyroid problems. HEMATOLOGIC AND LYMPHATIC: Denies easy bruising or bleeding. SKIN: Denies rash or lesion. MUSCULOSKELETAL: Denies pain in neck, back, shoulder, knees, hips or arthritic symptoms.  NEUROLOGIC: Denies paralysis, paresthesias.  PSYCHIATRIC: Denies anxiety or depressive symptoms.   VITAL SIGNS:  Blood pressure 117/74, pulse 79, temperature 98 F (36.7 C), temperature source Oral, resp. rate 18, height 6' (1.829 m), weight 96.525 kg (212 lb 12.8 oz), SpO2 94 %.  I/O:    Intake/Output Summary (Last 24 hours) at 09/23/15 1358 Last data filed at 09/23/15 1300  Gross per 24 hour  Intake    603 ml  Output      0 ml  Net    603 ml    PHYSICAL EXAMINATION:  GENERAL:  70 y.o.-year-old patient lying in the bed with no acute distress.  EYES: Pupils equal, round, reactive to light and accommodation. No scleral icterus. Extraocular muscles intact.  HEENT: Head atraumatic, normocephalic. Oropharynx and nasopharynx clear.  NECK:  Supple, no jugular venous distention. No thyroid enlargement, no tenderness.  LUNGS: Normal breath sounds bilaterally, no wheezing, rales,rhonchi or crepitation. No use of accessory muscles of respiration.  CARDIOVASCULAR: S1, S2 normal. No murmurs, rubs, or gallops.  ABDOMEN: Soft, non-tender, non-distended. Bowel sounds present. No organomegaly or mass.  EXTREMITIES: No pedal edema, cyanosis, or clubbing.  NEUROLOGIC: Cranial nerves II through XII are intact. Muscle strength 5/5 in all extremities. Sensation intact. Gait not checked.  PSYCHIATRIC: The patient is alert and  oriented x 3.  SKIN: No obvious rash, lesion, or ulcer.   DATA REVIEW:   CBC  Recent Labs Lab 09/23/15 0851  WBC 18.5*  HGB 14.3  HCT 42.8  PLT 207    Chemistries   Recent Labs Lab 09/17/15 1628  09/23/15 0851  NA 138  --   --   K 3.6  --   --   CL 101  --   --   CO2 25  --   --   GLUCOSE 116*  --   --   BUN 20  --   --   CREATININE 0.89  < > 0.90  CALCIUM 9.5  --   --   < > = values  in this interval not displayed.  Cardiac Enzymes  Recent Labs Lab 09/17/15 1628  TROPONINI <0.03    Microbiology Results  Results for orders placed or performed during the hospital encounter of 09/08/15  Rapid Influenza A&B Antigens ([facility] only)     Status: None   Collection Time: 09/08/15  5:21 PM  Result Value Ref Range Status   Influenza A ([facility]) NEGATIVE NEGATIVE Final   Influenza B ([facility]) NEGATIVE NEGATIVE Final  Culture, blood (Routine X 2) w Reflex to ID Panel     Status: None   Collection Time: 09/08/15  5:21 PM  Result Value Ref Range Status   Specimen Description BLOOD LEFT ANTECUBITAL  Final   Special Requests BOTTLES DRAWN AEROBIC AND ANAEROBIC  5CC  Final   Culture NO GROWTH 5 DAYS  Final   Report Status 09/13/2015 FINAL  Final  Culture, blood (Routine X 2) w Reflex to ID Panel     Status: None   Collection Time: 09/08/15  5:21 PM  Result Value Ref Range Status   Specimen Description BLOOD RIGHT ANTECUBITAL  Final   Special Requests BOTTLES DRAWN AEROBIC AND ANAEROBIC  4CC  Final   Culture NO GROWTH 5 DAYS  Final   Report Status 09/13/2015 FINAL  Final    RADIOLOGY:  Dg Chest 2 View  09/20/2015  CLINICAL DATA:  Shortness of Breath EXAM: CHEST  2 VIEW COMPARISON:  09/17/2015 FINDINGS: Cardiac shadow is mildly enlarged. A pacing device is again noted. Lungs are mildly hyperinflated consistent with COPD. Bony structures are stable from the prior exam. IMPRESSION: COPD without acute abnormality. Electronically Signed   By: Alcide Clever M.D.   On: 09/20/2015 16:04     EKG:   Orders placed or performed during the hospital encounter of 09/17/15  . ED EKG  . ED EKG  . EKG 12-Lead  . EKG 12-Lead      Management plans discussed with the patient she is  in agreement. More than 50% time was spent on face-to-face education, counseling and coordination of care  CODE STATUS:     Code Status Orders        Start     Ordered   09/17/15 1848  Full code   Continuous     09/17/15 1847    Code Status History    Date Active Date Inactive Code Status Order ID Comments User Context   09/08/2015  8:32 PM 09/11/2015  7:58 PM Full Code [id number]  Katharina Caper, MD Inpatient      TOTAL TIME TAKING CARE OF THIS PATIENT: 45 minutes.    @  on 09/23/2015 at 1:58 PM  Between 7am to 6pm - Pager - [phone]  After 6pm go to [email or web address] - password [email or web address] [facility]  [place]  [facility]  Office  [phone]  CC: Primary care physician; Dione Housekeeper, MD

## 2015-09-23 NOTE — Progress Notes (Signed)
AT REST 02- 97% 2 L  With Exertion- 91% 2 L

## 2015-09-23 NOTE — Progress Notes (Signed)
SATURATION QUALIFICATIONS: (This note is used to comply with regulatory documentation for home oxygen)  Patient Saturations on Room Air at Rest = 88%  Patient Saturations on Room Air while Ambulating = %  Patient Saturations on 2 Liters of oxygen while Ambulating = 91%  Please briefly explain why patient needs home oxygen: COPD

## 2015-09-23 NOTE — Care Management (Addendum)
Discharge to home today per Dr. Amado Coe.  Discussed home health agencies with Ms. Mckinlay. Chose [facility]. Will fax information to [facility], Also gave Ms. [place] a personal care service list. Offered [facility] information. States she has been there before. Wants to go to [facility] program.  Gwenette Greet RN MSN CCM care Management [phone]

## 2015-09-23 NOTE — Care Management Important Message (Signed)
Important Message  Patient Details  Name: Kathleen [name] MRN: [id number] Date of Birth: 08-Mar-1946   Medicare Important Message Given:  Yes    Olegario Messier A Walter Min 09/23/2015, 11:10 AM

## 2015-09-23 NOTE — Discharge Instructions (Signed)
Continue oxygen 2 L via nasal cannula Activity as tolerated Diet cardiac Follow-up with primary care physician in a week Follow-up with Cardiology in a week

## 2015-11-27 ENCOUNTER — Other Ambulatory Visit: Payer: Self-pay | Admitting: Family Medicine

## 2015-11-27 DIAGNOSIS — N6310 Unspecified lump in the right breast, unspecified quadrant: Secondary | ICD-10-CM

## 2015-11-27 DIAGNOSIS — R928 Other abnormal and inconclusive findings on diagnostic imaging of breast: Secondary | ICD-10-CM

## 2015-12-02 DIAGNOSIS — Z950 Presence of cardiac pacemaker: Secondary | ICD-10-CM | POA: Insufficient documentation

## 2015-12-04 ENCOUNTER — Other Ambulatory Visit: Payer: Self-pay | Admitting: Family Medicine

## 2015-12-04 DIAGNOSIS — Z1231 Encounter for screening mammogram for malignant neoplasm of breast: Secondary | ICD-10-CM

## 2015-12-04 DIAGNOSIS — R928 Other abnormal and inconclusive findings on diagnostic imaging of breast: Secondary | ICD-10-CM

## 2015-12-20 ENCOUNTER — Ambulatory Visit
Admission: RE | Admit: 2015-12-20 | Discharge: 2015-12-20 | Disposition: A | Discharge status: Home / Self Care | Payer: Medicare Other | Source: Ambulatory Visit | Attending: Family Medicine | Admitting: Family Medicine

## 2015-12-20 DIAGNOSIS — Z1231 Encounter for screening mammogram for malignant neoplasm of breast: Secondary | ICD-10-CM

## 2015-12-20 DIAGNOSIS — N6310 Unspecified lump in the right breast, unspecified quadrant: Secondary | ICD-10-CM

## 2015-12-20 DIAGNOSIS — N63 Unspecified lump in breast: Secondary | ICD-10-CM | POA: Insufficient documentation

## 2015-12-20 DIAGNOSIS — R928 Other abnormal and inconclusive findings on diagnostic imaging of breast: Secondary | ICD-10-CM

## 2016-03-06 ENCOUNTER — Encounter: Payer: Self-pay | Admitting: Internal Medicine

## 2016-03-09 ENCOUNTER — Ambulatory Visit (INDEPENDENT_AMBULATORY_CARE_PROVIDER_SITE_OTHER): Payer: Medicare Other | Admitting: Internal Medicine

## 2016-03-09 ENCOUNTER — Encounter: Payer: Self-pay | Admitting: Internal Medicine

## 2016-03-09 VITALS — BP 124/70 | HR 83 | Ht 62.0 in | Wt 211.6 lb

## 2016-03-09 DIAGNOSIS — Z950 Presence of cardiac pacemaker: Secondary | ICD-10-CM

## 2016-03-09 DIAGNOSIS — I481 Persistent atrial fibrillation: Secondary | ICD-10-CM | POA: Diagnosis not present

## 2016-03-09 DIAGNOSIS — I428 Other cardiomyopathies: Secondary | ICD-10-CM | POA: Diagnosis not present

## 2016-03-09 DIAGNOSIS — I4819 Other persistent atrial fibrillation: Secondary | ICD-10-CM

## 2016-03-09 DIAGNOSIS — I442 Atrioventricular block, complete: Secondary | ICD-10-CM | POA: Diagnosis not present

## 2016-03-09 LAB — CUP PACEART INCLINIC DEVICE CHECK
Battery Voltage: 3.02 V
Brady Statistic AP VS Percent: 0 %
Brady Statistic RA Percent Paced: 0 %
Brady Statistic RV Percent Paced: 99.99 %
Date Time Interrogation Session: 20171002180946
Implantable Lead Implant Date: 20150527
Implantable Lead Location: 753860
Implantable Lead Model: 5076
Lead Channel Impedance Value: 4047 Ohm
Lead Channel Impedance Value: 4047 Ohm
Lead Channel Impedance Value: 4047 Ohm
Lead Channel Impedance Value: 4047 Ohm
Lead Channel Impedance Value: 475 Ohm
Lead Channel Pacing Threshold Amplitude: 0.75 V
Lead Channel Pacing Threshold Pulse Width: 0.4 ms
Lead Channel Setting Sensing Sensitivity: 0.9 mV
MDC IDC MSMT BATTERY REMAINING LONGEVITY: 84 mo
MDC IDC MSMT LEADCHNL LV IMPEDANCE VALUE: 4047 Ohm
MDC IDC MSMT LEADCHNL LV IMPEDANCE VALUE: 4047 Ohm
MDC IDC MSMT LEADCHNL RA IMPEDANCE VALUE: 4047 Ohm
MDC IDC MSMT LEADCHNL RV IMPEDANCE VALUE: 551 Ohm
MDC IDC SET LEADCHNL RV PACING AMPLITUDE: 2 V
MDC IDC SET LEADCHNL RV PACING PULSEWIDTH: 0.4 ms
MDC IDC STAT BRADY AP VP PERCENT: 0 %
MDC IDC STAT BRADY AS VP PERCENT: 100 %
MDC IDC STAT BRADY AS VS PERCENT: 0 %

## 2016-03-09 NOTE — Progress Notes (Signed)
Electrophysiology Office Note   Date:  03/09/2016   ID:  Kathleen [name], DOB Jul 17, 1945, MRN [id number]  PCP:  Dione Housekeeper, MD  Cardiologist:  Dr Deboraha Sprang [facility]) Primary Electrophysiologist: Dr Christin Fudge  Chief Complaint  Patient presents with  . Atrial Fibrillation     History of Present Illness: Kathleen [name] is a 70 y.o. female who presents today for electrophysiology evaluation.   The patient has a h/o obesity, BOOP, COPD, longstanding persistent afib, and CHB s/p AV nodal ablation who presents for EP consultation.  She has had a challenging CV history.  She says that she was diagnosed with atrial fibrillation in 2014.  She took multaq but developed a rash.  She was placed on tikosyn but continued to have afib.  She appears to have had PVI + CTI by Dr Christin Fudge 2/15.  She states that this took 9 hours and was not successful.  She continued to have atrial arrhythmias.  She underwent AV nodal ablation and pacemaker implantation 11/02/15.  LV lead could not be placed however she received a CRT-P device.  Per Dr Isidore Moos recent note, it appears that the lead was not placed due to high LV lead thresholds.  She had return of her AV nodal conduction and required repeat AV nodal ablation by Dr Wilford Grist the following day.  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  She returned to EP lab and had the device placed submuscular Sept 2015. She has done reasonably well since that time.  She has occasional fatigue.  She struggles with chronic lung disease.  She has required high dose steroids previously.  She is on CPAP. Recently, echo reveals EF 40%.  She followed up with Dr Wilford Grist and discussions were had regarding repeat ablation vs repeat endovascular  LV lead attempt vs epicardial LV lead.  She presents today for second opinion.  She reports having difficulty with recurrent lung infections.  She has had multiple prior  infections.   Past Medical History:  Diagnosis Date  . Asthma   . Atypical atrial flutter (HCC)   . 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HCC)   . Chronic renal insufficiency   . 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 COPD (chronic obstructive pulmonary disease) (HCC)   . Gout   . Longstanding persistent atrial fibrillation (HCC)   . Nonischemic cardiomyopathy (HCC)   . Obesity   . Pacemaker   . Spontaneous pneumothorax 2013   Past Surgical History:  Procedure Laterality Date  . ABDOMINAL HYSTERECTOMY    . ATRIAL FIBRILLATION ABLATION  07/20/2013   by Dr Christin Fudge  . AV nodal ablation  11/01/2013   by Dr Christin Fudge, repeated by Dr Wilford Grist  . BREAST BIOPSY Bilateral 1997   negative  . CARDIAC CATHETERIZATION    . CHOLECYSTECTOMY    . HERNIA REPAIR    . PACEMAKER INSERTION     MDT Viva CRT-P implanted by Dr Christin Fudge after AV nodal ablation,  LV lead could not be placed     Current Outpatient Prescriptions  Medication Sig Dispense Refill  . albuterol (PROVENTIL HFA;VENTOLIN HFA) 108 (90 Base) MCG/ACT inhaler Inhale 2 puffs into the lungs every 4 (four) hours as needed for wheezing or shortness of breath.    [name] albuterol (PROVENTIL) (2.5 MG/3ML) 0.083% nebulizer solution Take 3 mLs (2.5 mg total) by nebulization every 6 (six) hours as needed for wheezing or shortness  of breath. 60 vial 0  . benzonatate (TESSALON) 200 MG capsule Take 200 mg by mouth 3 (three) times daily as needed for cough.     . budesonide-formoterol (SYMBICORT) 160-4.5 MCG/ACT inhaler Inhale 2 puffs into the lungs 2 (two) times daily.    . cholecalciferol (VITAMIN D) 1000 units tablet Take 2,000 Units by mouth at bedtime.    . colchicine 0.6 MG tablet Take 0.6-1.2 mg by mouth daily as needed (for gout flares).    . Febuxostat 80 MG TABS Take 1 tablet by mouth daily.    . fluticasone (FLONASE) 50 MCG/ACT nasal spray Place 1 spray into both nostrils daily.     [name]  guaiFENesin-dextromethorphan (ROBITUSSIN DM) 100-10 MG/5ML syrup Take 10 mLs by mouth every 4 (four) hours as needed for cough. 118 mL 0  . levothyroxine (SYNTHROID, LEVOTHROID) 75 MCG tablet Take 75 mcg by mouth daily before breakfast.   2  . montelukast (SINGULAIR) 10 MG tablet Take 10 mg by mouth at bedtime.    . predniSONE (STERAPRED UNI-PAK 21 TAB) 10 MG (21) TBPK tablet Take 1 tablet (10 mg total) by mouth daily. Take 6 tablets by mouth for 1 day followed by  5 tablets by mouth for 1 day followed by  4 tablets by mouth for 1 day followed by  3 tablets by mouth for 1 day followed by  2 tablets by mouth for 1 day followed by  1 tablet by mouth for a day and stop 21 tablet 0  . rivaroxaban (XARELTO) 20 MG TABS tablet Take 20 mg by mouth daily with supper.     [name] spironolactone (ALDACTONE) 50 MG tablet Take 75 mg by mouth daily.    [name] tiotropium (SPIRIVA) 18 MCG inhalation capsule Place 18 mcg into inhaler and inhale daily.    [name] torsemide (DEMADEX) 20 MG tablet Take 20 mg by mouth daily.     No current facility-administered medications for this visit.     Allergies:   Allopurinol; Clindamycin; Pneumococcal 13-val conj vacc; Dronedarone; Meperidine; Penicillins; Rosuvastatin; Tetracycline; Adhesive [tape]; and Lovastatin   Social History:  The patient  reports that she has quit smoking. She has never used smokeless tobacco. She reports that she does not drink alcohol or use drugs.   Family History:  The patient's family history includes Breast cancer in her cousin; Breast cancer (age of onset: 55) in her mother.    ROS:  Please see the history of present illness.   All other systems are reviewed and negative.    PHYSICAL EXAM: VS:  BP 124/70   Pulse 83   Ht 5\' 2"  (1.575 m)   Wt 211 lb 9.6 oz (96 kg)   SpO2 94%   BMI 38.70 kg/m  , BMI Body mass index is 38.7 kg/m. GEN: overweight, in no acute distress, walks slowly with a cane HEENT: normal  Neck: no JVD, carotid bruits, or  masses Cardiac: RRR;  (paced) no murmurs, rubs, or gallops,no edema  Respiratory:  clear to auscultation bilaterally, normal work of breathing GI: soft, nontender, nondistended, + BS MS: no deformity or atrophy  Skin: warm and dry, device pocket is well healed Neuro:  Strength and sensation are intact Psych: euthymic mood, full affect  EKG:  EKG 4/17 reveals afib, V paced  Device interrogation is reviewed today in detail.  See PaceArt for details.   Recent Labs: 09/09/2015: ALT 12 09/10/2015: Magnesium 1.9 09/17/2015: B Natriuretic Peptide 90.0; BUN 20; Potassium 3.6; Sodium 138 09/23/2015:  Creatinine, Ser 0.90; Hemoglobin 14.3; Platelets 207    Lipid Panel  No results found for: CHOL, TRIG, HDL, CHOLHDL, VLDL, LDLCALC, LDLDIRECT   Wt Readings from Last 3 Encounters:  03/09/16 211 lb 9.6 oz (96 kg)  09/23/15 212 lb 12.8 oz (96.5 kg)  09/08/15 210 lb 14.4 oz (95.7 kg)      Other studies Reviewed: Additional studies/ records that were reviewed today include: echo 8/17, Dr Wilford GristKoontz' recent consult note, epic records, labs, ekgs, device interrogation  Review of the above records today demonstrates: EF 40%, as above   ASSESSMENT AND PLAN:  1.  Longstanding persistent afib I am not certain that her symptoms are related to afib.  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 I would advise lifelong anticoagulation for chads2vasc score of at least 3.  Continue xarelto. Any attempts at sinus rhythm would require addition of an atrial lead.  I worry that attempts to maintain sinus long term would likely be unsuccessful.  I have advised rate control going forward.    2. Complete heart block The patient has had prior AV nodal ablation. Device interrogation is reviewed today and notable for single RV lead with normal pacing parameters.  Does not appear that she was ever taken down from 80 bpm after AV nodal ablation.  I did not make any changes  today and will defer to her primary electrophysiology team. I would not advise upgrade to CRT-P.  Prior attempts at endovascular LV lead placement were unsuccessful.  Further attempts would be complicated by prior twiddling and subpectoral device placement.  I think that risks of an epicardial lead would be prohibitively high.  Her EF is only mildly depressed and she is suprisingly not on ACE/ARB/Beta blocker therapy (See below).  3. Nonischemic CM Possibly due to RV pacing. She is not on an ace inhibitor, ARB, or beta blocker and does not ever recall trying them.  Before any aggressive EP procedures would be entertained, I think that medicine optimization should be considered. Given her lung disease, I did not make changes today. I have advised that she discuss consideration of ARB and selective beta [facility].  Hopefully these would not exacerbate her chronic lung disease.  I have advised that she continue to [facility].  I will see as needed going forward.    Randolm IdolSigned, Nioma Mccubbins, MD  03/09/2016 3:04 PM     [facility] [address] [place] [address] [phone] (office) [phone] (fax)

## 2016-03-09 NOTE — Patient Instructions (Signed)
Medication Instructions:  Your physician recommends that you continue on your current medications as directed. Please refer to the Current Medication list given to you today.  Labwork: None ordered.  Testing/Procedures: None ordered.  Follow-Up: Your physician recommends that you schedule a follow-up appointment as needed.   Any Other Special Instructions Will Be Listed Below (If Applicable).     If you need a refill on your cardiac medications before your next appointment, please call your pharmacy.   

## 2016-12-29 IMAGING — US US BREAST LTD UNI RIGHT INC AXILLA
1 series · 2 of 2 positions shown · non-contrast
Comparison: Priors

CLINICAL DATA: Patient recalled from screening for right breast
mass.

EXAM:
DIGITAL DIAGNOSTIC RIGHT MAMMOGRAM WITH 3D TOMOSYNTHESIS
ULTRASOUND RIGHT BREAST

[Series 1: us breast ltd uni right inc axilla · 0.08mm/px · 2 of 2 slices shown]
[im 1/2]
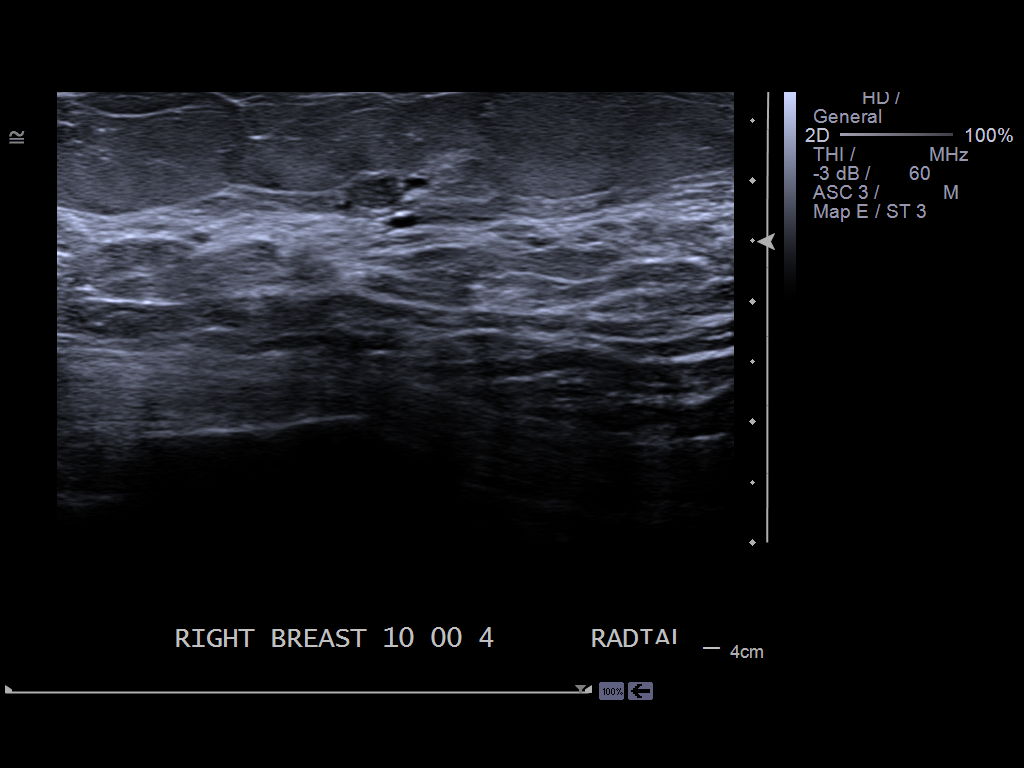
[im 2/2]
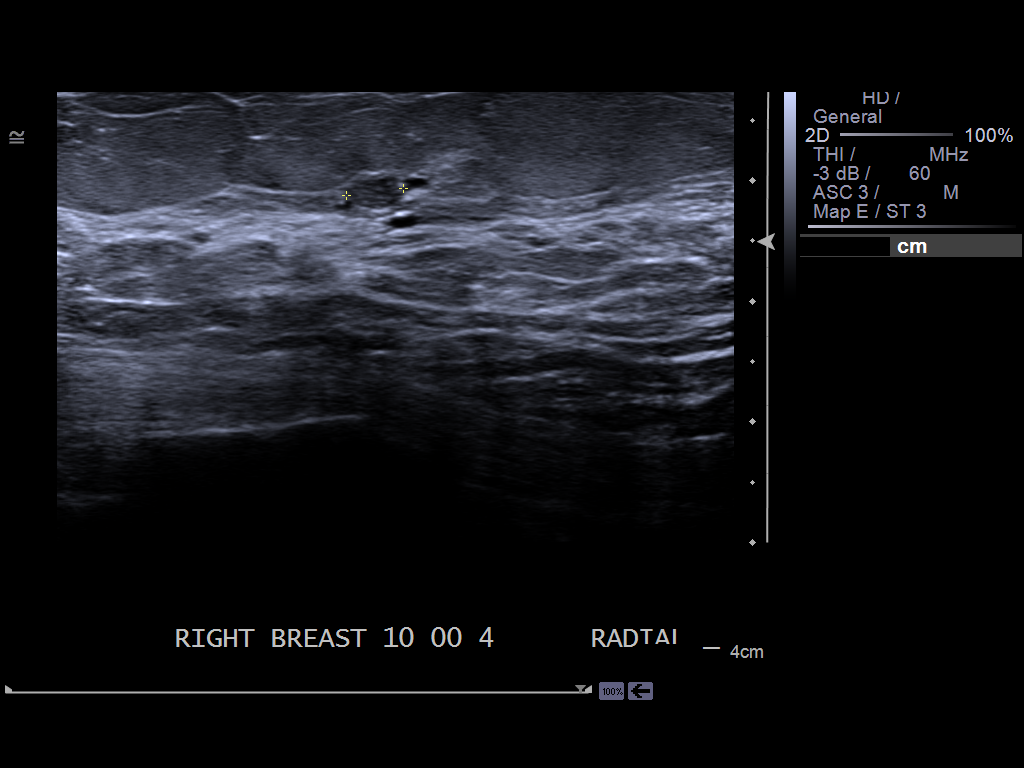

[2 of 2 positions shown; findings below may reference images not displayed]

ACR Breast Density Category c: The breast tissue is heterogeneously
dense, which may obscure small masses.
FINDINGS: Tomosynthesis spot compression CC and MLO views were obtained
demonstrating a persistent oval circumscribed 7 mm mass within the
upper-outer right breast middle depth.

On physical exam, I palpate no discrete mass within the upper-outer
right breast.

Targeted ultrasound is performed, showing a 7 x 3 x 6 mm oval
circumscribed hypoechoic mass within the right breast 10 o'clock
position 4 cm from the nipple, favored to represent a complicated
cyst. Immediately adjacent is an additional 5 x 3 x 5 mm oval
circumscribed mass, favored to represent a complicated cyst.
IMPRESSION: Probably benign right breast masses favored to represent complicated
cysts.

RECOMMENDATION:
Right breast diagnostic mammography and ultrasound in 6 months to
demonstrate stability of probably benign findings.

I have discussed the findings and recommendations with the patient.
Results were also provided in writing at the conclusion of the
visit. If applicable, a reminder letter will be sent to the patient
regarding the next appointment.

BI-RADS CATEGORY  3: Probably benign.

## 2017-01-15 ENCOUNTER — Other Ambulatory Visit: Payer: Self-pay | Admitting: Family Medicine

## 2017-01-15 DIAGNOSIS — Z1239 Encounter for other screening for malignant neoplasm of breast: Secondary | ICD-10-CM

## 2017-01-28 ENCOUNTER — Ambulatory Visit
Admission: RE | Admit: 2017-01-28 | Discharge: 2017-01-28 | Disposition: A | Discharge status: Home / Self Care | Payer: Medicare Other | Source: Ambulatory Visit | Attending: Family Medicine | Admitting: Family Medicine

## 2017-01-28 DIAGNOSIS — Z1239 Encounter for other screening for malignant neoplasm of breast: Secondary | ICD-10-CM

## 2017-01-28 DIAGNOSIS — Z1231 Encounter for screening mammogram for malignant neoplasm of breast: Secondary | ICD-10-CM | POA: Insufficient documentation

## 2017-04-16 IMAGING — MG MM DIAG BREAST TOMO UNI RIGHT
6 series · 6 of 14 positions shown · non-contrast
Comparison: Priors

CLINICAL DATA: Patient recalled from screening for right breast
mass.

EXAM:
DIGITAL DIAGNOSTIC RIGHT MAMMOGRAM WITH 3D TOMOSYNTHESIS
ULTRASOUND RIGHT BREAST

[R MLO]
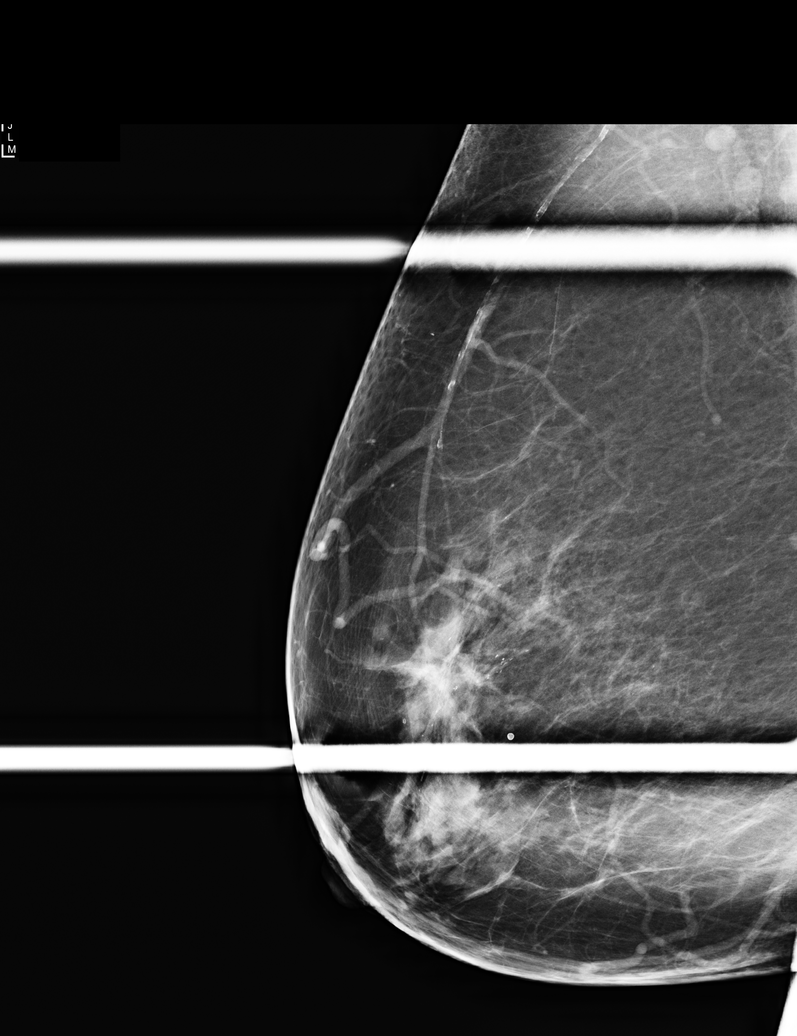

[R CC synth-2D]
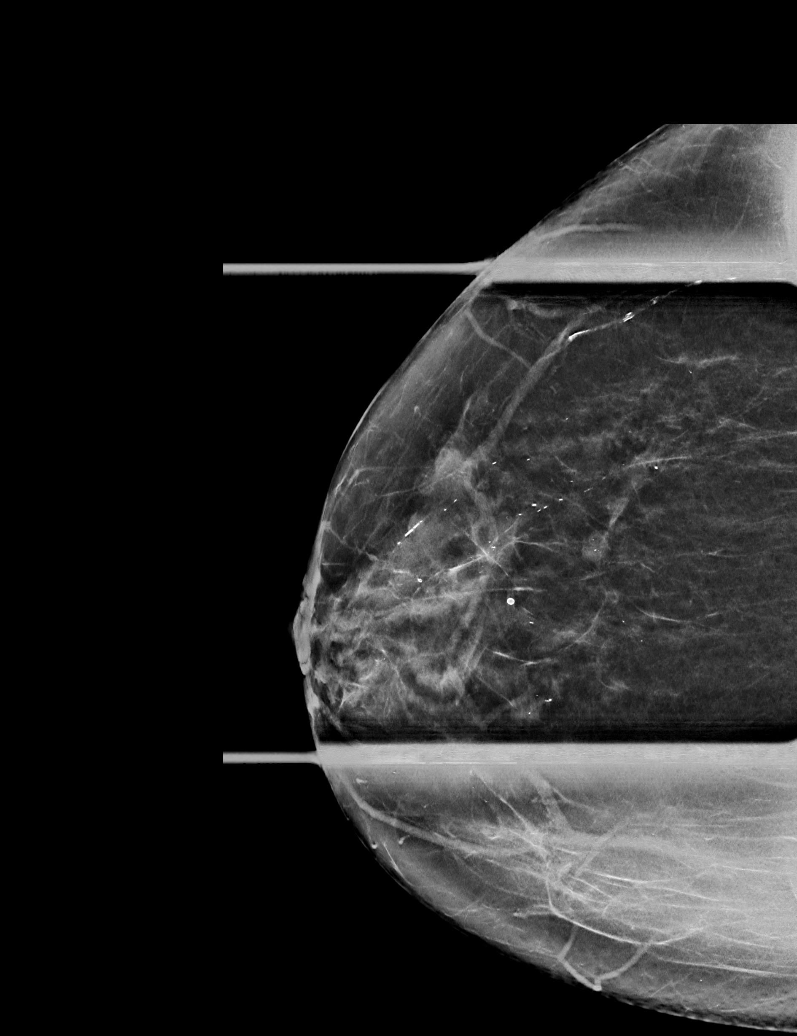

[R CC]
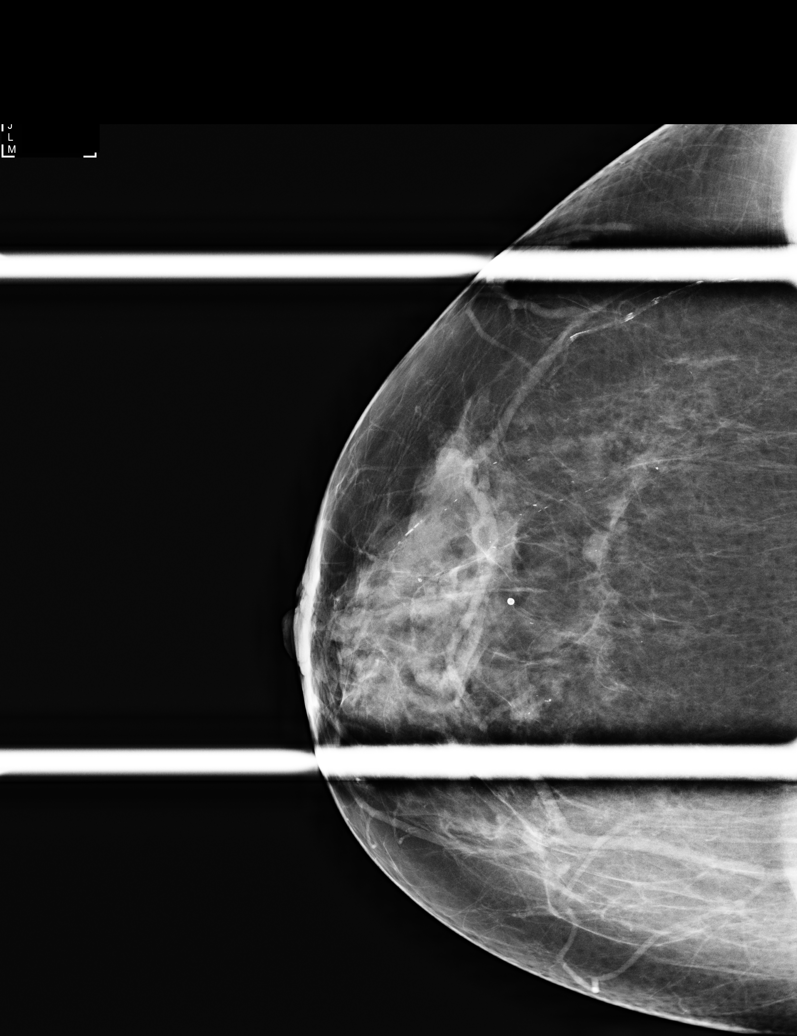

[R MLO synth-2D]
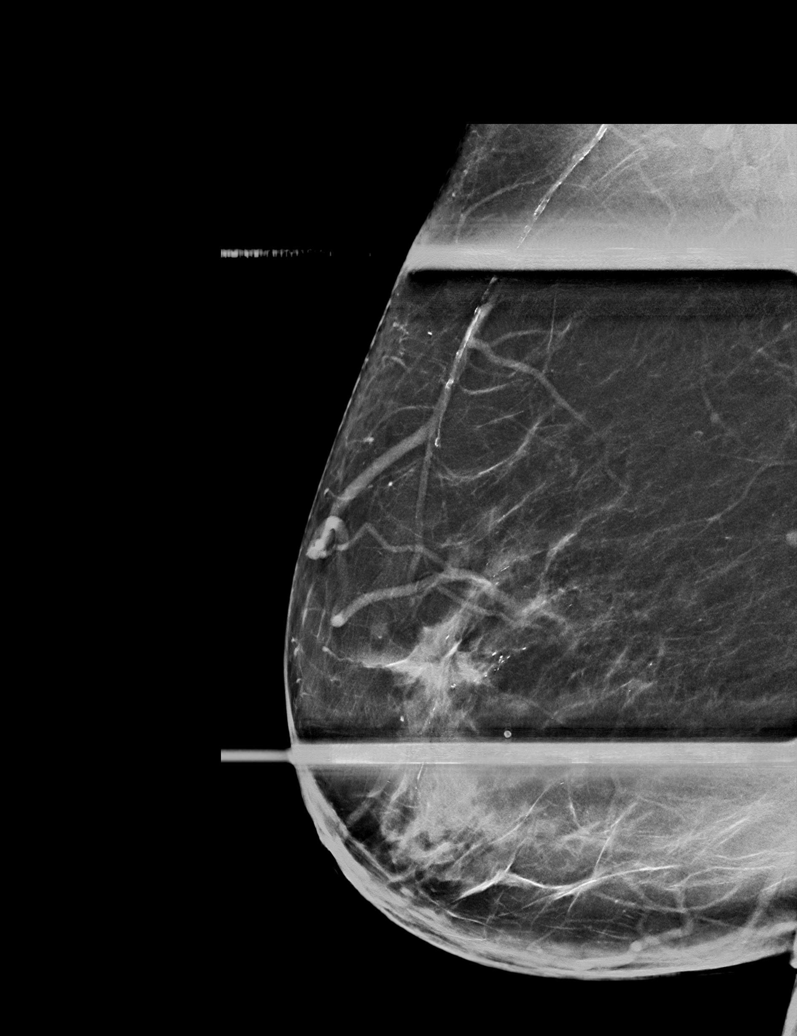

[R MLO tomo · tomo slice 35/70.0]
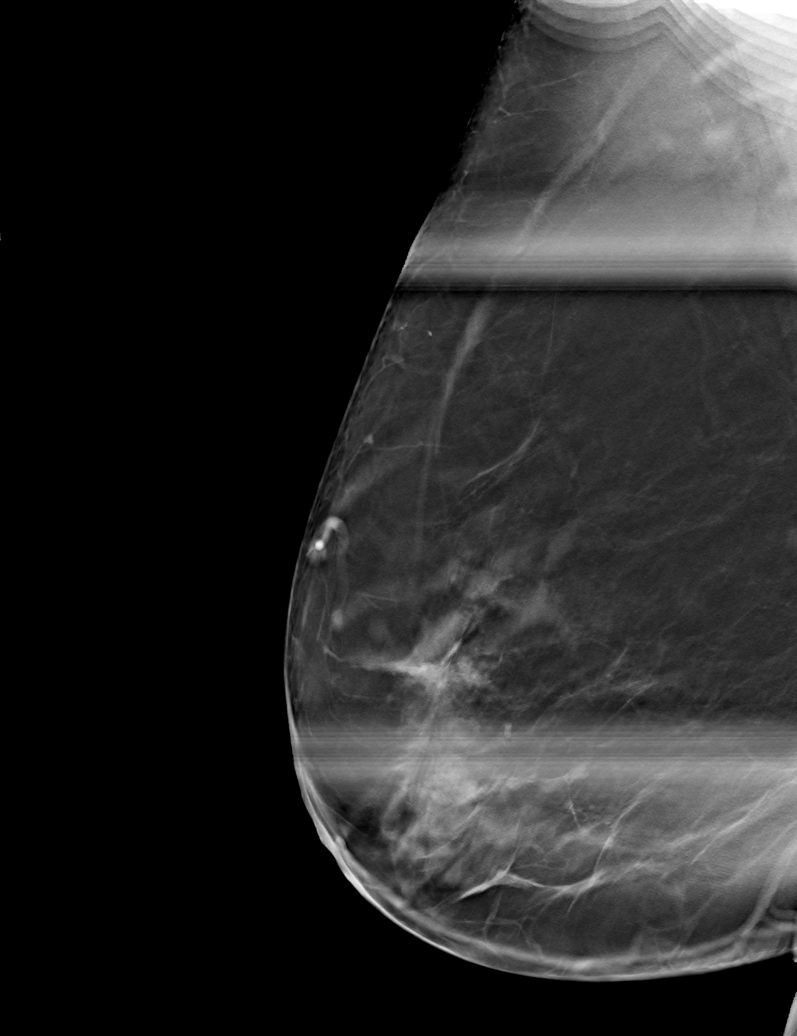

[R CC tomo · tomo slice 28/55.0]
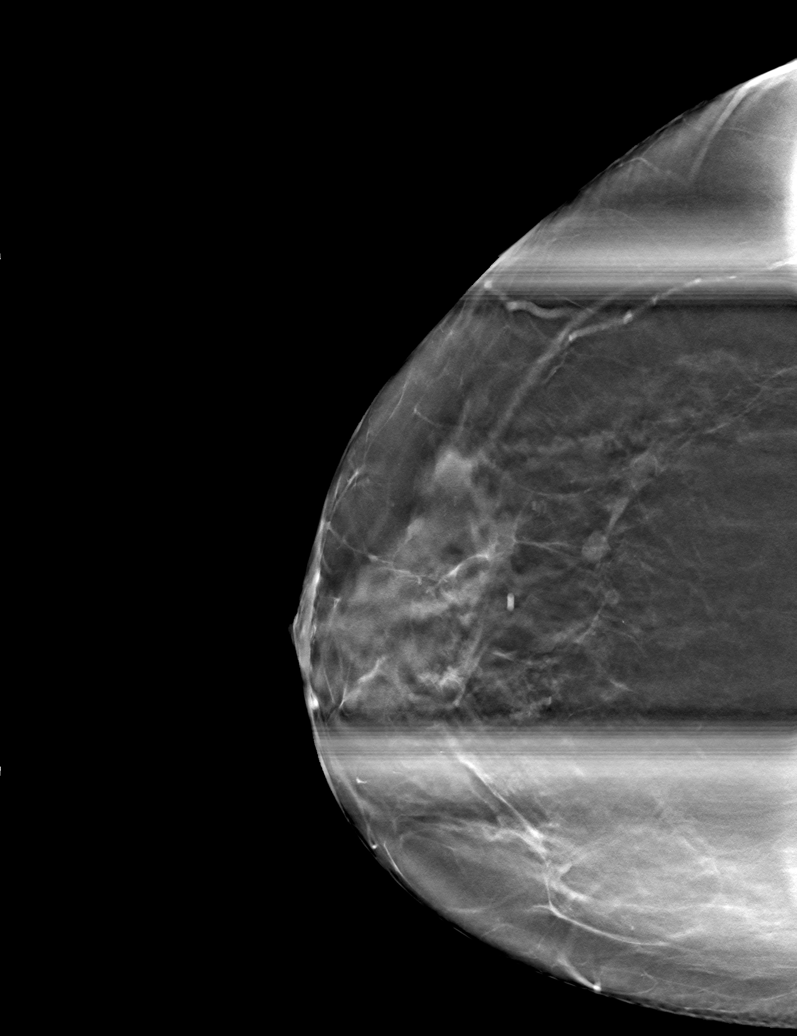

[6 of 14 positions shown; findings below may reference images not displayed]

ACR Breast Density Category c: The breast tissue is heterogeneously
dense, which may obscure small masses.
FINDINGS: Tomosynthesis spot compression CC and MLO views were obtained
demonstrating a persistent oval circumscribed 7 mm mass within the
upper-outer right breast middle depth.

On physical exam, I palpate no discrete mass within the upper-outer
right breast.

Targeted ultrasound is performed, showing a 7 x 3 x 6 mm oval
circumscribed hypoechoic mass within the right breast 10 o'clock
position 4 cm from the nipple, favored to represent a complicated
cyst. Immediately adjacent is an additional 5 x 3 x 5 mm oval
circumscribed mass, favored to represent a complicated cyst.
IMPRESSION: Probably benign right breast masses favored to represent complicated
cysts.

RECOMMENDATION:
Right breast diagnostic mammography and ultrasound in 6 months to
demonstrate stability of probably benign findings.

I have discussed the findings and recommendations with the patient.
Results were also provided in writing at the conclusion of the
visit. If applicable, a reminder letter will be sent to the patient
regarding the next appointment.

BI-RADS CATEGORY  3: Probably benign.

## 2017-05-13 ENCOUNTER — Other Ambulatory Visit: Payer: Self-pay

## 2017-05-13 ENCOUNTER — Emergency Department
Admission: EM | Admit: 2017-05-13 | Discharge: 2017-05-13 | Disposition: A | Discharge status: Home / Self Care | Payer: Medicare Other | Attending: Emergency Medicine | Admitting: Emergency Medicine

## 2017-05-13 ENCOUNTER — Encounter: Payer: Self-pay | Admitting: Emergency Medicine

## 2017-05-13 ENCOUNTER — Emergency Department: Payer: Medicare Other

## 2017-05-13 DIAGNOSIS — Y92481 Parking lot as the place of occurrence of the external cause: Secondary | ICD-10-CM | POA: Diagnosis not present

## 2017-05-13 DIAGNOSIS — Z7901 Long term (current) use of anticoagulants: Secondary | ICD-10-CM | POA: Diagnosis not present

## 2017-05-13 DIAGNOSIS — Z950 Presence of cardiac pacemaker: Secondary | ICD-10-CM | POA: Diagnosis not present

## 2017-05-13 DIAGNOSIS — Z87891 Personal history of nicotine dependence: Secondary | ICD-10-CM | POA: Diagnosis not present

## 2017-05-13 DIAGNOSIS — S0012XA Contusion of left eyelid and periocular area, initial encounter: Secondary | ICD-10-CM | POA: Insufficient documentation

## 2017-05-13 DIAGNOSIS — Z79899 Other long term (current) drug therapy: Secondary | ICD-10-CM | POA: Diagnosis not present

## 2017-05-13 DIAGNOSIS — J449 Chronic obstructive pulmonary disease, unspecified: Secondary | ICD-10-CM | POA: Insufficient documentation

## 2017-05-13 DIAGNOSIS — J45909 Unspecified asthma, uncomplicated: Secondary | ICD-10-CM | POA: Diagnosis not present

## 2017-05-13 DIAGNOSIS — Y999 Unspecified external cause status: Secondary | ICD-10-CM | POA: Diagnosis not present

## 2017-05-13 DIAGNOSIS — W010XXA Fall on same level from slipping, tripping and stumbling without subsequent striking against object, initial encounter: Secondary | ICD-10-CM | POA: Diagnosis not present

## 2017-05-13 DIAGNOSIS — Y9301 Activity, walking, marching and hiking: Secondary | ICD-10-CM | POA: Insufficient documentation

## 2017-05-13 DIAGNOSIS — S0990XA Unspecified injury of head, initial encounter: Secondary | ICD-10-CM | POA: Diagnosis present

## 2017-05-13 DIAGNOSIS — S0083XA Contusion of other part of head, initial encounter: Secondary | ICD-10-CM

## 2017-05-13 MED ORDER — HYDROCODONE-ACETAMINOPHEN 5-325 MG PO TABS
1.0000 | ORAL_TABLET | Freq: Once | ORAL | Status: AC
  Administered 2017-05-13: 1 via ORAL
  Filled 2017-05-13: qty 1

## 2017-05-13 MED ORDER — HYDROCODONE-ACETAMINOPHEN 5-325 MG PO TABS: 1.0000 | ORAL_TABLET | Freq: Three times a day (TID) | ORAL | 0 refills | Status: AC | PRN

## 2017-05-13 NOTE — ED Provider Notes (Signed)
[facility] Emergency Department Provider Note ____________________________________________  Time seen: 1555  I have reviewed the triage vital signs and the nursing notes.  HISTORY  Chief Complaint  Fall  HPI Kathleen [name] is a 71 y.o. female presents herself to the ED for evaluation of injury sustained following a mechanical fall.  Patient describes she tripped and fell over a parking lot, hitting the left side of her face.  She reports pain and swelling as well as bruising to the left periorbital region.  She also describes mild pain to the knees.  She denies any loss of consciousness, nausea, vomiting, dizziness, or weakness.  Also denies any neck pain, chest pain, or distal paresthesias.  Patient does take Xarelto for a fib/flutter, and has a pacemaker.  Her primary complaint is pain to the face.  Past Medical History:  Diagnosis Date  . Asthma   . Atypical atrial flutter (HCC)   . 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HCC)   . Chronic renal insufficiency   . 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 COPD (chronic obstructive pulmonary disease) (HCC)   . Gout   . Longstanding persistent atrial fibrillation (HCC)   . Nonischemic cardiomyopathy (HCC)   . Obesity   . Pacemaker   . Spontaneous pneumothorax 2013    Patient Active Problem List   Diagnosis Date Noted  . Acute bronchitis 09/17/2015  . Malnutrition of moderate degree 09/11/2015  . Fever 09/08/2015  . Hyponatremia 09/08/2015  . Acute respiratory failure with hypoxia (HCC) 09/08/2015  . Bacterial pneumonia 09/08/2015  . Acute on chronic respiratory failure with hypoxia (HCC) 09/08/2015    Past Surgical History:  Procedure Laterality Date  . ABDOMINAL HYSTERECTOMY    . ATRIAL FIBRILLATION ABLATION  07/20/2013   by Dr Christin Fudge  . AV nodal ablation  11/01/2013   by Dr Christin Fudge, repeated by Dr Wilford Grist  . BREAST BIOPSY Bilateral 1997   negative  . CARDIAC  CATHETERIZATION    . CHOLECYSTECTOMY    . HERNIA REPAIR    . PACEMAKER INSERTION     MDT Viva CRT-P implanted by Dr Christin Fudge after AV nodal ablation,  LV lead could not be placed    Prior to Admission medications   Medication Sig Start Date End Date Taking? Authorizing Provider  albuterol (PROVENTIL HFA;VENTOLIN HFA) 108 (90 Base) MCG/ACT inhaler Inhale 2 puffs into the lungs every 4 (four) hours as needed for wheezing or shortness of breath.    [provider]  albuterol (PROVENTIL) (2.5 MG/3ML) 0.083% nebulizer solution Take 3 mLs (2.5 mg total) by nebulization every 6 (six) hours as needed for wheezing or shortness of breath. 09/23/15   Gouru, Deanna Artis, MD  benzonatate (TESSALON) 200 MG capsule Take 200 mg by mouth 3 (three) times daily as needed for cough.     [provider]  budesonide-formoterol (SYMBICORT) 160-4.5 MCG/ACT inhaler Inhale 2 puffs into the lungs 2 (two) times daily.    [provider]  cholecalciferol (VITAMIN D) 1000 units tablet Take 2,000 Units by mouth at bedtime.    [provider]  colchicine 0.6 MG tablet Take 0.6-1.2 mg by mouth daily as needed (for gout flares).    [provider]  fluticasone (FLONASE) 50 MCG/ACT nasal spray Place 1 spray into both nostrils daily.     [provider]  guaiFENesin-dextromethorphan (ROBITUSSIN DM) 100-10 MG/5ML syrup Take 10 mLs by mouth every 4 (four) hours as needed for cough. 09/23/15  Ramonita LabGouru, Aruna, MD  HYDROcodone-acetaminophen (NORCO) 5-325 MG tablet Take 1 tablet by mouth 3 (three) times daily as needed for up to 3 days. 05/13/17 05/16/17  Casmere Hollenbeck, Charlesetta IvoryJenise V Bacon, PA-C  levothyroxine (SYNTHROID, LEVOTHROID) 75 MCG tablet Take 75 mcg by mouth daily before breakfast.     [provider]  montelukast (SINGULAIR) 10 MG tablet Take 10 mg by mouth at bedtime.    [provider]  predniSONE (STERAPRED UNI-PAK 21 TAB) 10 MG (21) TBPK tablet Take 1 tablet (10 mg total) by  mouth daily. Take 6 tablets by mouth for 1 day followed by  5 tablets by mouth for 1 day followed by  4 tablets by mouth for 1 day followed by  3 tablets by mouth for 1 day followed by  2 tablets by mouth for 1 day followed by  1 tablet by mouth for a day and stop 09/23/15   Ramonita LabGouru, Aruna, MD  rivaroxaban (XARELTO) 20 MG TABS tablet Take 20 mg by mouth daily with supper.     [provider]  spironolactone (ALDACTONE) 50 MG tablet Take 75 mg by mouth daily.    [provider]  tiotropium (SPIRIVA) 18 MCG inhalation capsule Place 18 mcg into inhaler and inhale daily.    [provider]  torsemide (DEMADEX) 20 MG tablet Take 20 mg by mouth daily.    [provider]    Allergies Allopurinol; Clindamycin; Pneumococcal 13-val conj vacc; Dronedarone; Meperidine; Penicillins; Rosuvastatin; Tetracycline; Adhesive [tape]; and Lovastatin  Family History  Problem Relation Age of Onset  . Breast cancer Mother 3442       3 different times  . Breast cancer Cousin     Social History Social History   Tobacco Use  . Smoking status: Former Games developermoker  . Smokeless tobacco: Never Used  Substance Use Topics  . Alcohol use: No    Alcohol/week: 0.0 oz  . Drug use: No    Review of Systems  Constitutional: Negative for fever. Eyes: Negative for visual changes. Facial bruising and swelling.  ENT: Negative for sore throat. Cardiovascular: Negative for chest pain. Respiratory: Negative for shortness of breath. Gastrointestinal: Negative for abdominal pain, vomiting and diarrhea. Genitourinary: Negative for dysuria. Musculoskeletal: Negative for back pain. Skin: Negative for rash. Neurological: Negative for headaches, focal weakness or numbness. ____________________________________________  PHYSICAL EXAM:  VITAL SIGNS: ED Triage Vitals [05/13/17 1433]  Enc Vitals Group     BP 136/78     Pulse Rate 80     Resp 18     Temp 98.7 F (37.1 C)     Temp Source Oral      SpO2 95 %     Weight 210 lb (95.3 kg)     Height 5\' 2"  (1.575 m)     Head Circumference      Peak Flow      Pain Score 10     Pain Loc      Pain Edu?      Excl. in GC?     Constitutional: Alert and oriented. Well appearing and in no distress. Head: Normocephalic and atraumatic, except for periorbital ecchymosis to the left eye. Eyes: Conjunctivae are normal. PERRL. Normal extraocular movements and fundi bilaterally.  Ears: Canals clear. TMs intact bilaterally. Nose: No congestion/rhinorrhea/epistaxis. Mouth/Throat: Mucous membranes are moist. Neck: Supple. No thyromegaly. Normal ROM. Cardiovascular: Normal rate, regular rhythm. Normal distal pulses. Respiratory: Normal respiratory effort. No wheezes/rales/rhonchi. Gastrointestinal: Soft and nontender. No distention. Musculoskeletal: Nontender with normal range  of motion in all extremities.  Neurologic:  CN II-Xii grossly intact. Normal speech and language. No gross focal neurologic deficits are appreciated. Skin:  Skin is warm, dry and intact. No rash noted. Psychiatric: Mood and affect are normal. Patient exhibits appropriate insight and judgment. ____________________________________________   RADIOLOGY  CT Head w/o CM  IMPRESSION: Atrophy with slight periventricular small vessel disease. No intracranial mass, hemorrhage, or acute infarct. Invagination of CSF into the sella is a finding of doubtful clinical significance.  Mild vascular calcification noted.  Left inferior frontal and supraorbital scalp hematoma. No intraorbital lesions. No fracture. There is slight mucosal thickening in several ethmoid air cells. ____________________________________________  PROCEDURES  Procedures Norco 5-325 mg PO ____________________________________________  INITIAL IMPRESSION / ASSESSMENT AND PLAN / ED COURSE  Geriatric patient with a ED evaluation of injury sustained following a mechanical fall.  Patient tripped over a  parking space block, and sustained a contusion to the left face and periorbital region.  CT scans are reassuring for no acute intracranial process or facial fractures.  Patient's exam is benign with no acute neuromuscular deficits or indication of concussive symptoms.  Patient complaint of facial tenderness is treated with hydrocodone in the ED.  Small prescription is provided for general pain relief.  She will follow-up with primary care provider for ongoing symptom management.  Precautions have been reviewed.  I reviewed the patient's prescription history over the last 12 months in the multi-state controlled substances database(s) that includes [place], [place], [place], [place], [place], [place], [place], [place], [place], [place], [place], [place], [place], and [place].  Results were notable for no current narcotic prescriptions ____________________________________________  FINAL CLINICAL IMPRESSION(S) / ED DIAGNOSES  Final diagnoses:  Contusion of face, initial encounter  Minor head injury, initial encounter      Lissa Hoard, PA-C 05/13/17 1629    Phineas Semen, MD 05/13/17 1714

## 2017-05-13 NOTE — ED Triage Notes (Signed)
Pt is on xarelto. Tripped and fell over parking block, hit left face. Swelling and bruising to left periorbital.  Mild pain to knees; no visible deformity or laceration. No LOC. No vomiting.  No neck pain.

## 2017-05-13 NOTE — ED Notes (Signed)
See triage note  States she tripped in parking lot  Hit face  Bruising noted to periorbital area  Denies any LOC

## 2017-05-13 NOTE — Discharge Instructions (Addendum)
Your exam and CT scan are essentially normal at this time. You have sustained a "black eye" from you fall. No brain injury or facial fractures are reported. Apply ice to reduce swelling. Take the pain medicine as needed. Follow-up with your provider or return to the ED as discussed.

## 2017-05-21 ENCOUNTER — Ambulatory Visit
Admission: RE | Admit: 2017-05-21 | Discharge: 2017-05-21 | Disposition: A | Discharge status: Home / Self Care | Payer: Medicare Other | Source: Ambulatory Visit | Attending: Physician Assistant | Admitting: Physician Assistant

## 2017-05-21 ENCOUNTER — Other Ambulatory Visit: Payer: Self-pay | Admitting: Physician Assistant

## 2017-05-21 DIAGNOSIS — S0990XS Unspecified injury of head, sequela: Secondary | ICD-10-CM

## 2017-05-21 DIAGNOSIS — I739 Peripheral vascular disease, unspecified: Secondary | ICD-10-CM | POA: Diagnosis not present

## 2017-05-21 DIAGNOSIS — G319 Degenerative disease of nervous system, unspecified: Secondary | ICD-10-CM | POA: Insufficient documentation

## 2017-05-21 DIAGNOSIS — S0003XA Contusion of scalp, initial encounter: Secondary | ICD-10-CM | POA: Diagnosis not present

## 2017-05-21 DIAGNOSIS — X58XXXA Exposure to other specified factors, initial encounter: Secondary | ICD-10-CM | POA: Diagnosis not present

## 2017-06-08 HISTORY — PX: PACEMAKER INSERTION: SHX728

## 2017-08-17 ENCOUNTER — Other Ambulatory Visit: Payer: Self-pay | Admitting: Internal Medicine

## 2017-08-17 DIAGNOSIS — R1012 Left upper quadrant pain: Secondary | ICD-10-CM

## 2017-08-26 ENCOUNTER — Ambulatory Visit
Admission: RE | Admit: 2017-08-26 | Discharge: 2017-08-26 | Disposition: A | Discharge status: Home / Self Care | Payer: Medicare Other | Source: Ambulatory Visit | Attending: Internal Medicine | Admitting: Internal Medicine

## 2017-08-26 DIAGNOSIS — R1012 Left upper quadrant pain: Secondary | ICD-10-CM

## 2017-08-26 DIAGNOSIS — N2889 Other specified disorders of kidney and ureter: Secondary | ICD-10-CM | POA: Diagnosis not present

## 2017-08-26 MED ORDER — IOPAMIDOL (ISOVUE-300) INJECTION 61%
100.0000 mL | Freq: Once | INTRAVENOUS | Status: AC | PRN
  Administered 2017-08-26: 100 mL via INTRAVENOUS

## 2017-09-07 ENCOUNTER — Other Ambulatory Visit: Payer: Self-pay | Admitting: Internal Medicine

## 2017-09-07 DIAGNOSIS — N289 Disorder of kidney and ureter, unspecified: Secondary | ICD-10-CM

## 2017-09-16 ENCOUNTER — Ambulatory Visit (HOSPITAL_COMMUNITY)
Admission: RE | Admit: 2017-09-16 | Discharge: 2017-09-16 | Disposition: A | Discharge status: Home / Self Care | Payer: Medicare Other | Source: Ambulatory Visit | Attending: Internal Medicine | Admitting: Internal Medicine

## 2017-09-16 DIAGNOSIS — I517 Cardiomegaly: Secondary | ICD-10-CM | POA: Insufficient documentation

## 2017-09-16 DIAGNOSIS — I700 Atherosclerosis of aorta: Secondary | ICD-10-CM | POA: Insufficient documentation

## 2017-09-16 DIAGNOSIS — K5730 Diverticulosis of large intestine without perforation or abscess without bleeding: Secondary | ICD-10-CM | POA: Diagnosis not present

## 2017-09-16 DIAGNOSIS — E882 Lipomatosis, not elsewhere classified: Secondary | ICD-10-CM | POA: Diagnosis not present

## 2017-09-16 DIAGNOSIS — N289 Disorder of kidney and ureter, unspecified: Secondary | ICD-10-CM | POA: Diagnosis present

## 2017-09-16 DIAGNOSIS — K7689 Other specified diseases of liver: Secondary | ICD-10-CM | POA: Insufficient documentation

## 2017-09-16 DIAGNOSIS — N281 Cyst of kidney, acquired: Secondary | ICD-10-CM | POA: Insufficient documentation

## 2017-09-16 LAB — CREATININE, SERUM
Creatinine, Ser: 0.91 mg/dL (ref 0.44–1.00)
GFR calc Af Amer: >60 mL/min (ref >60)
GFR calc non Af Amer: >60 mL/min (ref >60)

## 2017-09-16 MED ORDER — GADOBENATE DIMEGLUMINE 529 MG/ML IV SOLN
20.0000 mL | Freq: Once | INTRAVENOUS | Status: AC
  Administered 2017-09-16: 20 mL via INTRAVENOUS

## 2017-10-23 ENCOUNTER — Emergency Department
Admission: EM | Admit: 2017-10-23 | Discharge: 2017-10-23 | Disposition: A | Discharge status: Home / Self Care | Payer: Medicare Other | Attending: Emergency Medicine | Admitting: Emergency Medicine

## 2017-10-23 ENCOUNTER — Encounter: Payer: Self-pay | Admitting: Emergency Medicine

## 2017-10-23 DIAGNOSIS — K602 Anal fissure, unspecified: Secondary | ICD-10-CM | POA: Diagnosis not present

## 2017-10-23 DIAGNOSIS — J45909 Unspecified asthma, uncomplicated: Secondary | ICD-10-CM | POA: Insufficient documentation

## 2017-10-23 DIAGNOSIS — K5900 Constipation, unspecified: Secondary | ICD-10-CM | POA: Diagnosis not present

## 2017-10-23 DIAGNOSIS — J449 Chronic obstructive pulmonary disease, unspecified: Secondary | ICD-10-CM | POA: Insufficient documentation

## 2017-10-23 DIAGNOSIS — Z79899 Other long term (current) drug therapy: Secondary | ICD-10-CM | POA: Insufficient documentation

## 2017-10-23 DIAGNOSIS — Z950 Presence of cardiac pacemaker: Secondary | ICD-10-CM | POA: Diagnosis not present

## 2017-10-23 DIAGNOSIS — K922 Gastrointestinal hemorrhage, unspecified: Secondary | ICD-10-CM

## 2017-10-23 DIAGNOSIS — Z87891 Personal history of nicotine dependence: Secondary | ICD-10-CM | POA: Diagnosis not present

## 2017-10-23 DIAGNOSIS — Z7901 Long term (current) use of anticoagulants: Secondary | ICD-10-CM | POA: Insufficient documentation

## 2017-10-23 DIAGNOSIS — K625 Hemorrhage of anus and rectum: Secondary | ICD-10-CM | POA: Diagnosis present

## 2017-10-23 LAB — COMPREHENSIVE METABOLIC PANEL
ALT: 18 U/L (ref 14–54)
ANION GAP: 15 (ref 5–15)
AST: 33 U/L (ref 15–41)
Albumin: 4.4 g/dL (ref 3.5–5.0)
Alkaline Phosphatase: 48 U/L (ref 38–126)
BUN: 21 mg/dL — ABNORMAL HIGH (ref 6–20)
CHLORIDE: 98 mmol/L — AB (ref 101–111)
CO2: 23 mmol/L (ref 22–32)
Calcium: 9.2 mg/dL (ref 8.9–10.3)
Creatinine, Ser: 1 mg/dL (ref 0.44–1.00)
GFR, EST NON AFRICAN AMERICAN: 56 mL/min — AB (ref >60)
Glucose, Bld: 202 mg/dL — ABNORMAL HIGH (ref 65–99)
POTASSIUM: 3.7 mmol/L (ref 3.5–5.1)
SODIUM: 136 mmol/L (ref 135–145)
Total Bilirubin: 1.2 mg/dL (ref 0.3–1.2)
Total Protein: 7.2 g/dL (ref 6.5–8.1)

## 2017-10-23 LAB — CBC
HEMATOCRIT: 41.3 % (ref 35.0–47.0)
Hemoglobin: 14.3 g/dL (ref 12.0–16.0)
MCH: 30.2 pg (ref 26.0–34.0)
MCHC: 34.8 g/dL (ref 32.0–36.0)
MCV: 86.8 fL (ref 80.0–100.0)
PLATELETS: 187 10*3/uL (ref 150–440)
RBC: 4.75 MIL/uL (ref 3.80–5.20)
RDW: 14.7 % — ABNORMAL HIGH (ref 11.5–14.5)
WBC: 10.6 10*3/uL (ref 3.6–11.0)

## 2017-10-23 LAB — TYPE AND SCREEN
ABO/RH(D): A POS
ANTIBODY SCREEN: NEGATIVE

## 2017-10-23 MED ORDER — BISACODYL 5 MG PO TBEC: 5.0000 mg | DELAYED_RELEASE_TABLET | Freq: Every day | ORAL | 0 refills | Status: AC

## 2017-10-23 MED ORDER — DOCUSATE SODIUM 100 MG PO CAPS: 100.0000 mg | ORAL_CAPSULE | Freq: Two times a day (BID) | ORAL | 0 refills | Status: AC

## 2017-10-23 NOTE — ED Provider Notes (Signed)
[facility] Emergency Department Provider Note  ____________________________________________   First MD Initiated Contact with Patient 10/23/17 1829     (approximate)  I have reviewed the triage vital signs and the nursing notes.   HISTORY  Chief Complaint Rectal Bleeding   HPI Kathleen [name] is a 72 y.o. female who comes to the emergency department with bright red blood per rectum that began yesterday evening.  She has been constipated recently and she had sharp severe pain when having a bowel movement.  She has never had a colonoscopy secondary to complex cardiac history.  She recently had an ablation for atrial fibrillation several weeks ago and is currently still taking Xarelto.  She has no particular abdominal pain.  No fevers or chills.  No chest pain or shortness of breath.  Her symptoms are only when defecating and are mild to moderate.  Nothing particular seems to make them better.  Past Medical History:  Diagnosis Date  . Asthma   . Atypical atrial flutter (HCC)   . 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HCC)   . Chronic renal insufficiency   . 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 COPD (chronic obstructive pulmonary disease) (HCC)   . Gout   . Longstanding persistent atrial fibrillation (HCC)   . Nonischemic cardiomyopathy (HCC)   . Obesity   . Pacemaker   . Spontaneous pneumothorax 2013    Patient Active Problem List   Diagnosis Date Noted  . Acute bronchitis 09/17/2015  . Malnutrition of moderate degree 09/11/2015  . Fever 09/08/2015  . Hyponatremia 09/08/2015  . Acute respiratory failure with hypoxia (HCC) 09/08/2015  . Bacterial pneumonia 09/08/2015  . Acute on chronic respiratory failure with hypoxia (HCC) 09/08/2015    Past Surgical History:  Procedure Laterality Date  . ABDOMINAL HYSTERECTOMY    . ATRIAL FIBRILLATION ABLATION  07/20/2013   by Dr Christin Fudge  . AV nodal ablation  11/01/2013     by Dr Christin Fudge, repeated by Dr Wilford Grist  . BREAST BIOPSY Bilateral 1997   negative  . CARDIAC CATHETERIZATION    . CHOLECYSTECTOMY    . HERNIA REPAIR    . PACEMAKER INSERTION     MDT Viva CRT-P implanted by Dr Christin Fudge after AV nodal ablation,  LV lead could not be placed    Prior to Admission medications   Medication Sig Start Date End Date Taking? Authorizing Provider  albuterol (PROVENTIL HFA;VENTOLIN HFA) 108 (90 Base) MCG/ACT inhaler Inhale 2 puffs into the lungs every 4 (four) hours as needed for wheezing or shortness of breath.    [provider]  albuterol (PROVENTIL) (2.5 MG/3ML) 0.083% nebulizer solution Take 3 mLs (2.5 mg total) by nebulization every 6 (six) hours as needed for wheezing or shortness of breath. 09/23/15   Gouru, Deanna Artis, MD  benzonatate (TESSALON) 200 MG capsule Take 200 mg by mouth 3 (three) times daily as needed for cough.     [provider]  bisacodyl (DULCOLAX) 5 MG EC tablet Take 1 tablet (5 mg total) by mouth daily. 10/23/17 10/23/18  Merrily Brittle, MD  budesonide-formoterol (SYMBICORT) 160-4.5 MCG/ACT inhaler Inhale 2 puffs into the lungs 2 (two) times daily.    [provider]  cholecalciferol (VITAMIN D) 1000 units tablet Take 2,000 Units by mouth at bedtime.    [provider]  colchicine 0.6 MG tablet Take 0.6-1.2 mg by mouth daily as needed (for gout flares).    [provider]  docusate  sodium (COLACE) 100 MG capsule Take 1 capsule (100 mg total) by mouth 2 (two) times daily. 10/23/17 11/22/17  Merrily Brittle, MD  fluticasone (FLONASE) 50 MCG/ACT nasal spray Place 1 spray into both nostrils daily.     [provider]  guaiFENesin-dextromethorphan (ROBITUSSIN DM) 100-10 MG/5ML syrup Take 10 mLs by mouth every 4 (four) hours as needed for cough. 09/23/15   Gouru, Deanna Artis, MD  levothyroxine (SYNTHROID, LEVOTHROID) 75 MCG tablet Take 75 mcg by mouth daily before breakfast.     [provider]   montelukast (SINGULAIR) 10 MG tablet Take 10 mg by mouth at bedtime.    [provider]  predniSONE (STERAPRED UNI-PAK 21 TAB) 10 MG (21) TBPK tablet Take 1 tablet (10 mg total) by mouth daily. Take 6 tablets by mouth for 1 day followed by  5 tablets by mouth for 1 day followed by  4 tablets by mouth for 1 day followed by  3 tablets by mouth for 1 day followed by  2 tablets by mouth for 1 day followed by  1 tablet by mouth for a day and stop 09/23/15   Ramonita Lab, MD  rivaroxaban (XARELTO) 20 MG TABS tablet Take 20 mg by mouth daily with supper.     [provider]  spironolactone (ALDACTONE) 50 MG tablet Take 75 mg by mouth daily.    [provider]  tiotropium (SPIRIVA) 18 MCG inhalation capsule Place 18 mcg into inhaler and inhale daily.    [provider]  torsemide (DEMADEX) 20 MG tablet Take 20 mg by mouth daily.    [provider]    Allergies Allopurinol; Clindamycin; Pneumococcal 13-val conj vacc; Dronedarone; Meperidine; Penicillins; Rosuvastatin; Tetracycline; Adhesive [tape]; and Lovastatin  Family History  Problem Relation Age of Onset  . Breast cancer Mother 17       3 different times  . Breast cancer Cousin     Social History Social History   Tobacco Use  . Smoking status: Former Games developer  . Smokeless tobacco: Never Used  Substance Use Topics  . Alcohol use: No    Alcohol/week: 0.0 oz  . Drug use: No    Review of Systems Constitutional: No fever/chills Eyes: No visual changes. ENT: No sore throat. Cardiovascular: Denies chest pain. Respiratory: Denies shortness of breath. Gastrointestinal: No abdominal pain.  No nausea, no vomiting.  No diarrhea.  Positive for constipation. Genitourinary: Negative for dysuria. Musculoskeletal: Negative for back pain. Skin: Negative for rash. Neurological: Negative for headaches, focal weakness or numbness.   ____________________________________________   PHYSICAL  EXAM:  VITAL SIGNS: ED Triage Vitals  Enc Vitals Group     BP 10/23/17 1603 129/76     Pulse Rate 10/23/17 1603 90     Resp 10/23/17 1603 (!) 24     Temp 10/23/17 1603 99.1 F (37.3 C)     Temp Source 10/23/17 1603 Oral     SpO2 10/23/17 1603 94 %     Weight 10/23/17 1606 219 lb (99.3 kg)     Height 10/23/17 1606 5\' 2"  (1.575 m)     Head Circumference --      Peak Flow --      Pain Score 10/23/17 1606 0     Pain Loc --      Pain Edu? --      Excl. in GC? --     Constitutional: Alert and oriented x4 pleasant cooperative speaks full sentences no diaphoresis Eyes: PERRL EOMI. Head: Atraumatic. Nose: No congestion/rhinnorhea.  Mouth/Throat: No trismus Neck: No stridor.   Cardiovascular: Normal rate, regular rhythm. Grossly normal heart sounds.  Good peripheral circulation. Respiratory: Normal respiratory effort.  No retractions. Lungs CTAB and moving good air Gastrointestinal: Soft nontender Rectal exam performed with female nurse chaperone: Anal fissure with sentinel pile surrounding dark blood around her anus Musculoskeletal: Legs are equal in size Neurologic:  Normal speech and language. No gross focal neurologic deficits are appreciated. Skin:  Skin is warm, dry and intact. No rash noted. Psychiatric: Mood and affect are normal. Speech and behavior are normal.    ____________________________________________   DIFFERENTIAL includes but not limited to  Anal fissure, internal hemorrhoid, external hemorrhoid, diverticulitis, colon cancer ____________________________________________   LABS (all labs ordered are listed, but only abnormal results are displayed)  Labs Reviewed  COMPREHENSIVE METABOLIC PANEL - Abnormal; Notable for the following components:      Result Value   Chloride 98 (*)    Glucose, Bld 202 (*)    BUN 21 (*)    GFR calc non Af Amer 56 (*)    All other components within normal limits  CBC - Abnormal; Notable for the following components:   RDW 14.7  (*)    All other components within normal limits  TYPE AND SCREEN    Lab work reviewed by me with no signs of anemia __________________________________________  EKG  ED ECG REPORT I, Merrily Brittle, the attending physician, personally viewed and interpreted this ECG.  Date: 10/23/2017 EKG Time:  Rate: 88 Rhythm: Ventricular paced rhythm QRS Axis: Leftward axis Intervals: normal ST/T Wave abnormalities: normal Narrative Interpretation: no evidence of acute ischemia _________________________________________  RADIOLOGY   ____________________________________________   PROCEDURES  Procedure(s) performed: no  Procedures  Critical Care performed: no  Observation: no ____________________________________________   INITIAL IMPRESSION / ASSESSMENT AND PLAN / ED COURSE  Pertinent labs & imaging results that were available during my care of the patient were reviewed by me and considered in my medical decision making (see chart for details).  The patient arrives hemodynamically stable and quite well-appearing.  Rectal exam performed with female chaperone shows what appears to be an anal fissure with a sentinel pile.  I advised the patient to continue taking her Xarelto given her history of atrial fibrillation however I have encouraged her to increase the water and fiber in her diet as well as sitz baths.  I will also refer her to GI as an outpatient.  She is discharged home in improved condition verbalizes understanding agreement with plan.      ____________________________________________   FINAL CLINICAL IMPRESSION(S) / ED DIAGNOSES  Final diagnoses:  Anal fissure  Lower GI bleed      NEW MEDICATIONS STARTED DURING THIS VISIT:  Discharge Medication List as of 10/23/2017  6:42 PM    START taking these medications   Details  bisacodyl (DULCOLAX) 5 MG EC tablet Take 1 tablet (5 mg total) by mouth daily., Starting Sat 10/23/2017, Until Sun 10/23/2018, Print     docusate sodium (COLACE) 100 MG capsule Take 1 capsule (100 mg total) by mouth 2 (two) times daily., Starting Sat 10/23/2017, Until Mon 11/22/2017, Print         Note:  This document was prepared using Dragon voice recognition software and may include unintentional dictation errors.     Merrily Brittle, MD 10/24/17 1348

## 2017-10-23 NOTE — Discharge Instructions (Signed)
It was a pleasure to take care of you today, and thank you for coming to our emergency department.  If you have any questions or concerns before leaving please ask the nurse to grab me and I'm more than happy to go through your aftercare instructions again.  If you were prescribed any opioid pain medication today such as Norco, Vicodin, Percocet, morphine, hydrocodone, or oxycodone please make sure you do not drive when you are taking this medication as it can alter your ability to drive safely.  If you have any concerns once you are home that you are not improving or are in fact getting worse before you can make it to your follow-up appointment, please do not hesitate to call [phone] and come back for further evaluation.  Merrily Brittle, MD  Results for orders placed or performed during the hospital encounter of 10/23/17  Comprehensive metabolic panel  Result Value Ref Range   Sodium 136 135 - 145 mmol/L   Potassium 3.7 3.5 - 5.1 mmol/L   Chloride 98 (L) 101 - 111 mmol/L   CO2 23 22 - 32 mmol/L   Glucose, Bld 202 (H) 65 - 99 mg/dL   BUN 21 (H) 6 - 20 mg/dL   Creatinine, Ser 0.24 0.44 - 1.00 mg/dL   Calcium 9.2 8.9 - 09.7 mg/dL   Total Protein 7.2 6.5 - 8.1 g/dL   Albumin 4.4 3.5 - 5.0 g/dL   AST 33 15 - 41 U/L   ALT 18 14 - 54 U/L   Alkaline Phosphatase 48 38 - 126 U/L   Total Bilirubin 1.2 0.3 - 1.2 mg/dL   GFR calc non Af Amer 56 (L) >60 mL/min   GFR calc Af Amer >60 >60 mL/min   Anion gap 15 5 - 15  CBC  Result Value Ref Range   WBC 10.6 3.6 - 11.0 K/uL   RBC 4.75 3.80 - 5.20 MIL/uL   Hemoglobin 14.3 12.0 - 16.0 g/dL   HCT 35.3 29.9 - 24.2 %   MCV 86.8 80.0 - 100.0 fL   MCH 30.2 26.0 - 34.0 pg   MCHC 34.8 32.0 - 36.0 g/dL   RDW 68.3 (H) 41.9 - 62.2 %   Platelets 187 150 - 440 K/uL  Type and screen [facility]  Result Value Ref Range   ABO/RH(D) A POS    Antibody Screen NEG    Sample Expiration      10/26/2017 Performed at [facility], [address] [place], [place] [address]

## 2017-10-23 NOTE — ED Triage Notes (Signed)
Patient presents to the ED with bright red rectal bleeding that began last night. Patient states, "When I go to the bathroom, it sounds like I'm peeing, but it's all blood from my bottom."  Patient had a cardiac ablation on Wednesday.  Patient's last bowel movement was Tuesday morning.  Patient takes xarelto.  Patient reports shortness of breath since the ablation which she was checked for yesterday at [facility].

## 2018-02-08 ENCOUNTER — Other Ambulatory Visit: Payer: Self-pay | Admitting: Internal Medicine

## 2018-02-08 DIAGNOSIS — Z1231 Encounter for screening mammogram for malignant neoplasm of breast: Secondary | ICD-10-CM

## 2018-02-16 ENCOUNTER — Ambulatory Visit
Admission: RE | Admit: 2018-02-16 | Discharge: 2018-02-16 | Disposition: A | Discharge status: Home / Self Care | Payer: Medicare Other | Source: Ambulatory Visit | Attending: Internal Medicine | Admitting: Internal Medicine

## 2018-02-16 DIAGNOSIS — Z1231 Encounter for screening mammogram for malignant neoplasm of breast: Secondary | ICD-10-CM | POA: Insufficient documentation

## 2018-03-28 ENCOUNTER — Emergency Department: Payer: Medicare Other

## 2018-03-28 ENCOUNTER — Other Ambulatory Visit: Payer: Self-pay

## 2018-03-28 ENCOUNTER — Encounter: Payer: Self-pay | Admitting: Emergency Medicine

## 2018-03-28 ENCOUNTER — Inpatient Hospital Stay
Admission: EM | Admit: 2018-03-28 | Discharge: 2018-03-30 | DRG: 291 | Disposition: A | Discharge status: Home / Self Care | Payer: Medicare Other | Attending: Internal Medicine | Admitting: Internal Medicine

## 2018-03-28 DIAGNOSIS — Z9049 Acquired absence of other specified parts of digestive tract: Secondary | ICD-10-CM

## 2018-03-28 DIAGNOSIS — Z90710 Acquired absence of both cervix and uterus: Secondary | ICD-10-CM

## 2018-03-28 DIAGNOSIS — Z950 Presence of cardiac pacemaker: Secondary | ICD-10-CM

## 2018-03-28 DIAGNOSIS — Z887 Allergy status to serum and vaccine status: Secondary | ICD-10-CM

## 2018-03-28 DIAGNOSIS — M109 Gout, unspecified: Secondary | ICD-10-CM | POA: Diagnosis present

## 2018-03-28 DIAGNOSIS — Z881 Allergy status to other antibiotic agents status: Secondary | ICD-10-CM | POA: Diagnosis not present

## 2018-03-28 DIAGNOSIS — E669 Obesity, unspecified: Secondary | ICD-10-CM | POA: Diagnosis present

## 2018-03-28 DIAGNOSIS — J9611 Chronic respiratory failure with hypoxia: Secondary | ICD-10-CM | POA: Diagnosis present

## 2018-03-28 DIAGNOSIS — Z888 Allergy status to other drugs, medicaments and biological substances status: Secondary | ICD-10-CM

## 2018-03-28 DIAGNOSIS — Z23 Encounter for immunization: Secondary | ICD-10-CM

## 2018-03-28 DIAGNOSIS — Z9981 Dependence on supplemental oxygen: Secondary | ICD-10-CM

## 2018-03-28 DIAGNOSIS — I4811 Longstanding persistent atrial fibrillation: Secondary | ICD-10-CM | POA: Diagnosis present

## 2018-03-28 DIAGNOSIS — I428 Other cardiomyopathies: Secondary | ICD-10-CM | POA: Diagnosis present

## 2018-03-28 DIAGNOSIS — J441 Chronic obstructive pulmonary disease with (acute) exacerbation: Secondary | ICD-10-CM | POA: Diagnosis present

## 2018-03-28 DIAGNOSIS — Z7901 Long term (current) use of anticoagulants: Secondary | ICD-10-CM

## 2018-03-28 DIAGNOSIS — Z87891 Personal history of nicotine dependence: Secondary | ICD-10-CM

## 2018-03-28 DIAGNOSIS — Z79899 Other long term (current) drug therapy: Secondary | ICD-10-CM

## 2018-03-28 DIAGNOSIS — I5023 Acute on chronic systolic (congestive) heart failure: Principal | ICD-10-CM | POA: Diagnosis present

## 2018-03-28 DIAGNOSIS — Z7951 Long term (current) use of inhaled steroids: Secondary | ICD-10-CM | POA: Diagnosis not present

## 2018-03-28 DIAGNOSIS — I484 Atypical atrial flutter: Secondary | ICD-10-CM | POA: Diagnosis present

## 2018-03-28 DIAGNOSIS — Z91048 Other nonmedicinal substance allergy status: Secondary | ICD-10-CM | POA: Diagnosis not present

## 2018-03-28 DIAGNOSIS — J9621 Acute and chronic respiratory failure with hypoxia: Secondary | ICD-10-CM | POA: Diagnosis present

## 2018-03-28 DIAGNOSIS — Z79890 Hormone replacement therapy (postmenopausal): Secondary | ICD-10-CM

## 2018-03-28 DIAGNOSIS — Z880 Allergy status to penicillin: Secondary | ICD-10-CM

## 2018-03-28 DIAGNOSIS — Z6841 Body Mass Index (BMI) 40.0 and over, adult: Secondary | ICD-10-CM

## 2018-03-28 DIAGNOSIS — J9601 Acute respiratory failure with hypoxia: Secondary | ICD-10-CM

## 2018-03-28 DIAGNOSIS — I442 Atrioventricular block, complete: Secondary | ICD-10-CM | POA: Diagnosis present

## 2018-03-28 LAB — COMPREHENSIVE METABOLIC PANEL
ALBUMIN: 4.7 g/dL (ref 3.5–5.0)
ALT: 15 U/L (ref 0–44)
AST: 20 U/L (ref 15–41)
Alkaline Phosphatase: 64 U/L (ref 38–126)
Anion gap: 14 (ref 5–15)
BUN: 18 mg/dL (ref 8–23)
CHLORIDE: 102 mmol/L (ref 98–111)
CO2: 26 mmol/L (ref 22–32)
CREATININE: 0.94 mg/dL (ref 0.44–1.00)
Calcium: 9.3 mg/dL (ref 8.9–10.3)
GFR calc Af Amer: >60 mL/min (ref >60)
GFR, EST NON AFRICAN AMERICAN: 60 mL/min — AB (ref >60)
GLUCOSE: 168 mg/dL — AB (ref 70–99)
POTASSIUM: 3.2 mmol/L — AB (ref 3.5–5.1)
SODIUM: 142 mmol/L (ref 135–145)
Total Bilirubin: 1.1 mg/dL (ref 0.3–1.2)
Total Protein: 7.4 g/dL (ref 6.5–8.1)

## 2018-03-28 LAB — CBC
HCT: 46.9 % — ABNORMAL HIGH (ref 36.0–46.0)
HEMOGLOBIN: 15.6 g/dL — AB (ref 12.0–15.0)
MCH: 28.9 pg (ref 26.0–34.0)
MCHC: 33.3 g/dL (ref 30.0–36.0)
MCV: 86.9 fL (ref 80.0–100.0)
NRBC: 0 % (ref 0.0–0.2)
PLATELETS: 221 10*3/uL (ref 150–400)
RBC: 5.4 MIL/uL — AB (ref 3.87–5.11)
RDW: 13.8 % (ref 11.5–15.5)
WBC: 14.6 10*3/uL — ABNORMAL HIGH (ref 4.0–10.5)

## 2018-03-28 LAB — BRAIN NATRIURETIC PEPTIDE: B NATRIURETIC PEPTIDE 5: 77 pg/mL (ref 0.0–100.0)

## 2018-03-28 LAB — TROPONIN I

## 2018-03-28 MED ORDER — IPRATROPIUM-ALBUTEROL 0.5-2.5 (3) MG/3ML IN SOLN
3.0000 mL | Freq: Once | RESPIRATORY_TRACT | Status: AC
  Administered 2018-03-28: 3 mL via RESPIRATORY_TRACT
  Filled 2018-03-28: qty 3

## 2018-03-28 MED ORDER — INFLUENZA VAC SPLIT HIGH-DOSE 0.5 ML IM SUSY
0.5000 mL | PREFILLED_SYRINGE | INTRAMUSCULAR | Status: DC
  Filled 2018-03-28: qty 0.5

## 2018-03-28 MED ORDER — ALBUTEROL SULFATE (2.5 MG/3ML) 0.083% IN NEBU: 2.5000 mg | INHALATION_SOLUTION | RESPIRATORY_TRACT | Status: DC | PRN

## 2018-03-28 MED ORDER — IPRATROPIUM-ALBUTEROL 0.5-2.5 (3) MG/3ML IN SOLN
3.0000 mL | Freq: Four times a day (QID) | RESPIRATORY_TRACT | Status: DC
  Administered 2018-03-29 – 2018-03-30 (×5): 3 mL via RESPIRATORY_TRACT
  Filled 2018-03-28 (×7): qty 3

## 2018-03-28 MED ORDER — METHYLPREDNISOLONE SODIUM SUCC 125 MG IJ SOLR
60.0000 mg | INTRAMUSCULAR | Status: DC
  Administered 2018-03-29: 60 mg via INTRAVENOUS
  Filled 2018-03-28: qty 2

## 2018-03-28 MED ORDER — RIVAROXABAN 20 MG PO TABS
20.0000 mg | ORAL_TABLET | Freq: Every day | ORAL | Status: DC
  Administered 2018-03-28 – 2018-03-29 (×2): 20 mg via ORAL
  Filled 2018-03-28 (×2): qty 1

## 2018-03-28 MED ORDER — POLYETHYLENE GLYCOL 3350 17 G PO PACK: 17.0000 g | PACK | Freq: Every day | ORAL | Status: DC | PRN

## 2018-03-28 MED ORDER — FLUTICASONE PROPIONATE 50 MCG/ACT NA SUSP
1.0000 | Freq: Every day | NASAL | Status: DC
  Administered 2018-03-29 – 2018-03-30 (×2): 1 via NASAL
  Filled 2018-03-28: qty 16

## 2018-03-28 MED ORDER — MOMETASONE FURO-FORMOTEROL FUM 200-5 MCG/ACT IN AERO
2.0000 | INHALATION_SPRAY | Freq: Two times a day (BID) | RESPIRATORY_TRACT | Status: DC
  Administered 2018-03-28 – 2018-03-30 (×4): 2 via RESPIRATORY_TRACT
  Filled 2018-03-28: qty 8.8

## 2018-03-28 MED ORDER — VITAMIN D3 25 MCG (1000 UNIT) PO TABS
2000.0000 [IU] | ORAL_TABLET | Freq: Every day | ORAL | Status: DC
  Administered 2018-03-28 – 2018-03-29 (×2): 2000 [IU] via ORAL
  Filled 2018-03-28 (×4): qty 2

## 2018-03-28 MED ORDER — RIVAROXABAN 20 MG PO TABS: 20.0000 mg | ORAL_TABLET | Freq: Every day | ORAL | Status: DC

## 2018-03-28 MED ORDER — TORSEMIDE 20 MG PO TABS
20.0000 mg | ORAL_TABLET | Freq: Every day | ORAL | Status: DC
Start: 2018-03-28 — End: 2018-03-29
  Administered 2018-03-29: 20 mg via ORAL
  Filled 2018-03-28: qty 1

## 2018-03-28 MED ORDER — LEVOTHYROXINE SODIUM 50 MCG PO TABS
75.0000 ug | ORAL_TABLET | Freq: Every day | ORAL | Status: DC
  Administered 2018-03-29 – 2018-03-30 (×2): 75 ug via ORAL
  Filled 2018-03-28 (×3): qty 1

## 2018-03-28 MED ORDER — SPIRONOLACTONE 25 MG PO TABS
75.0000 mg | ORAL_TABLET | Freq: Every day | ORAL | Status: DC
  Administered 2018-03-29 – 2018-03-30 (×2): 75 mg via ORAL
  Filled 2018-03-28 (×2): qty 3

## 2018-03-28 MED ORDER — TIOTROPIUM BROMIDE MONOHYDRATE 18 MCG IN CAPS
18.0000 ug | ORAL_CAPSULE | Freq: Every day | RESPIRATORY_TRACT | Status: DC
  Administered 2018-03-29 – 2018-03-30 (×2): 18 ug via RESPIRATORY_TRACT
  Filled 2018-03-28: qty 5

## 2018-03-28 MED ORDER — ALBUTEROL SULFATE (2.5 MG/3ML) 0.083% IN NEBU
5.0000 mg | INHALATION_SOLUTION | Freq: Once | RESPIRATORY_TRACT | Status: AC
  Administered 2018-03-28: 5 mg via RESPIRATORY_TRACT
  Filled 2018-03-28: qty 6

## 2018-03-28 MED ORDER — ACETAMINOPHEN 650 MG RE SUPP: 650.0000 mg | Freq: Four times a day (QID) | RECTAL | Status: DC | PRN

## 2018-03-28 MED ORDER — METHYLPREDNISOLONE SODIUM SUCC 125 MG IJ SOLR
125.0000 mg | Freq: Once | INTRAMUSCULAR | Status: AC
  Administered 2018-03-28: 125 mg via INTRAVENOUS
  Filled 2018-03-28: qty 2

## 2018-03-28 MED ORDER — MONTELUKAST SODIUM 10 MG PO TABS
10.0000 mg | ORAL_TABLET | Freq: Every day | ORAL | Status: DC
  Administered 2018-03-28: 10 mg via ORAL
  Filled 2018-03-28: qty 1

## 2018-03-28 MED ORDER — ACETAMINOPHEN 325 MG PO TABS
650.0000 mg | ORAL_TABLET | Freq: Four times a day (QID) | ORAL | Status: DC | PRN
  Administered 2018-03-28: 650 mg via ORAL
  Filled 2018-03-28: qty 2

## 2018-03-28 MED ORDER — GUAIFENESIN-DM 100-10 MG/5ML PO SYRP
10.0000 mL | ORAL_SOLUTION | ORAL | Status: DC | PRN
  Administered 2018-03-29 – 2018-03-30 (×3): 10 mL via ORAL
  Filled 2018-03-28 (×3): qty 10

## 2018-03-28 NOTE — ED Notes (Signed)
Ambulated pt to end of hall, pulse ox dropped to 89. After pt used bathroom pt had a low of 84 pulse ox.

## 2018-03-28 NOTE — Progress Notes (Signed)
Family Meeting Note  Advance Directive yes   Being admitted with increasing shortness of breath and hypoxia. She has history of CHF systolic in the remote past along with COPD and uses oxygen nighttime. Discuss code status with patient she wishes to be full code  Time spent 1 16 minutes   Kathleen [name]

## 2018-03-28 NOTE — ED Triage Notes (Signed)
Chest pain x 1 week, states has had cough.

## 2018-03-28 NOTE — ED Provider Notes (Addendum)
[facility] Emergency Department Provider Note    First MD Initiated Contact with Patient 03/28/18 1456     (approximate)  I have reviewed the triage vital signs and the nursing notes.   HISTORY  Chief Complaint Chest Pain    HPI Kathleen [name] is a 72 y.o. female extensive cardiac and pulmonary history as listed below not on home oxygen presents the ER with worsening shortness of breath over the past week.  States with any exertion her pulse oximeter at home will go down to the 80s.  She has significant exertional dyspnea.  States that earlier in the week she did notice 8 pound weight gain but was able to drop that weight after increasing her torsemide at home.  Denies any chest pain.  Does have been having nonproductive cough.  No measured fevers.  She is actually at her pulmonologist clinic today when checked and was found to be hypoxic so she was directed to the ER for further evaluation.    Past Medical History:  Diagnosis Date  . Asthma   . Atypical atrial flutter (HCC)   . 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HCC)   . Chronic renal insufficiency   . 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 COPD (chronic obstructive pulmonary disease) (HCC)   . Gout   . Longstanding persistent atrial fibrillation   . Nonischemic cardiomyopathy (HCC)   . Obesity   . Pacemaker   . Spontaneous pneumothorax 2013   Family History  Problem Relation Age of Onset  . Breast cancer Mother 36       3 different times  . Breast cancer Cousin    Past Surgical History:  Procedure Laterality Date  . ABDOMINAL HYSTERECTOMY    . ATRIAL FIBRILLATION ABLATION  07/20/2013   by Dr Christin Fudge  . AV nodal ablation  11/01/2013   by Dr Christin Fudge, repeated by Dr Wilford Grist  . BREAST BIOPSY Bilateral 1997   negative  . CARDIAC CATHETERIZATION    . CHOLECYSTECTOMY    . HERNIA REPAIR    . PACEMAKER INSERTION  06/2017   MDT Viva CRT-P implanted by Dr  Christin Fudge after AV nodal ablation,  LV lead could not be placed   Patient Active Problem List   Diagnosis Date Noted  . Acute on chronic respiratory failure with hypoxemia (HCC) 03/28/2018  . Acute bronchitis 09/17/2015  . Malnutrition of moderate degree 09/11/2015  . Fever 09/08/2015  . Hyponatremia 09/08/2015  . Acute respiratory failure with hypoxia (HCC) 09/08/2015  . Bacterial pneumonia 09/08/2015  . Acute on chronic respiratory failure with hypoxia (HCC) 09/08/2015      Prior to Admission medications   Medication Sig Start Date End Date Taking? Authorizing Provider  albuterol (PROVENTIL HFA;VENTOLIN HFA) 108 (90 Base) MCG/ACT inhaler Inhale 2 puffs into the lungs every 4 (four) hours as needed for wheezing or shortness of breath.   Yes [provider]  albuterol (PROVENTIL) (2.5 MG/3ML) 0.083% nebulizer solution Take 3 mLs (2.5 mg total) by nebulization every 6 (six) hours as needed for wheezing or shortness of breath. 09/23/15  Yes Gouru, Deanna Artis, MD  azithromycin (ZITHROMAX) 250 MG tablet Take 250 mg by mouth daily.   Yes [provider]  bisacodyl (DULCOLAX) 5 MG EC tablet Take 1 tablet (5 mg total) by mouth daily. Patient taking differently: Take 5 mg by mouth daily as needed for moderate constipation.  10/23/17 10/23/18 Yes Merrily Brittle, MD  budesonide-formoterol [facility]) 160-4.5  MCG/ACT inhaler Inhale 2 puffs into the lungs 2 (two) times daily.   Yes [provider]  cholecalciferol (VITAMIN D) 1000 units tablet Take 2,000 Units by mouth at bedtime.   Yes [provider]  colchicine 0.6 MG tablet Take 0.6-1.2 mg by mouth daily as needed (for gout flares).   Yes [provider]  docusate sodium (COLACE) 100 MG capsule Take 100 mg by mouth 2 (two) times daily.   Yes [provider]  fluticasone (FLONASE) 50 MCG/ACT nasal spray Place 1 spray into both nostrils daily.    Yes [provider]  levothyroxine (SYNTHROID,  LEVOTHROID) 75 MCG tablet Take 75 mcg by mouth daily before breakfast.    Yes [provider]  rivaroxaban (XARELTO) 20 MG TABS tablet Take 20 mg by mouth daily with supper.    Yes [provider]  SPIRIVA RESPIMAT 2.5 MCG/ACT AERS Inhale 2 puffs into the lungs daily. 01/05/18  Yes [provider]  spironolactone (ALDACTONE) 50 MG tablet Take 75 mg by mouth daily.   Yes [provider]  torsemide (DEMADEX) 20 MG tablet Take 20 mg by mouth daily.   Yes [provider]  ULORIC 80 MG TABS Take 80 mg by mouth daily. 03/07/18  Yes [provider]  diclofenac sodium (VOLTAREN) 1 % GEL Apply 2 g topically 4 (four) times daily as needed (pain).    [provider]  montelukast (SINGULAIR) 10 MG tablet Take 10 mg by mouth at bedtime.    [provider]  predniSONE (DELTASONE) 2.5 MG tablet Take 3 tablets (7.5 mg total) by mouth daily for 7 days. 03/30/18 04/06/18  Ihor Austin, MD    Allergies Allopurinol; Clindamycin; Pneumococcal 13-val conj vacc; Dronedarone; Meperidine; Penicillins; Rosuvastatin; Tetracycline; Adhesive [tape]; and Lovastatin    Social History Social History   Tobacco Use  . Smoking status: Former Games developer  . Smokeless tobacco: Never Used  Substance Use Topics  . Alcohol use: No    Alcohol/week: 0.0 standard drinks  . Drug use: No    Review of Systems Patient denies headaches, rhinorrhea, blurry vision, numbness, shortness of breath, chest pain, edema, cough, abdominal pain, nausea, vomiting, diarrhea, dysuria, fevers, rashes or hallucinations unless otherwise stated above in HPI. ____________________________________________   PHYSICAL EXAM:  VITAL SIGNS: Vitals:   03/30/18 0505 03/30/18 0754  BP: (!) 125/56 (!) 154/75  Pulse: 79 74  Resp:    Temp: 98 F (36.7 C) 97.8 F (36.6 C)  SpO2: 93% 97%    Constitutional: Alert and oriented.  Eyes: Conjunctivae are normal.  Head: Atraumatic. Nose:  No congestion/rhinnorhea. Mouth/Throat: Mucous membranes are moist.   Neck: No stridor. Painless ROM.  Cardiovascular: Normal rate, regular rhythm. Grossly normal heart sounds.  Good peripheral circulation. Respiratory: Normal respiratory effort.  No retractions. Lungs with bibasilar crackles Gastrointestinal: Soft and nontender. No distention. No abdominal bruits. No CVA tenderness. Genitourinary:  Musculoskeletal: No lower extremity tenderness nor edema.  No joint effusions. Neurologic:  Normal speech and language. No gross focal neurologic deficits are appreciated. No facial droop Skin:  Skin is warm, dry and intact. No rash noted. Psychiatric: Mood and affect are normal. Speech and behavior are normal.  ____________________________________________   LABS (all labs ordered are listed, but only abnormal results are displayed)  No results found for this or any previous visit (from the past 24 hour(s)). ____________________________________________  EKG My review and personal interpretation at Time: 12:31   Indication: sob  Rate: 80  Rhythm: a-s  vpaced Axis: left Other: abnormal ekg ____________________________________________  RADIOLOGY  I personally reviewed all radiographic images ordered to evaluate for the above acute complaints and reviewed radiology reports and findings.  These findings were personally discussed with the patient.  Please see medical record for radiology report.  ____________________________________________   PROCEDURES  Procedure(s) performed:  .Critical Care Performed by: Willy Eddy, MD Authorized by: Willy Eddy, MD   Critical care provider statement:    Critical care time (minutes):  35   Critical care time was exclusive of:  Separately billable procedures and treating other patients   Critical care was necessary to treat or prevent imminent or life-threatening deterioration of the following conditions:  Respiratory failure   Critical  care was time spent personally by me on the following activities:  Development of treatment plan with patient or surrogate, discussions with consultants, evaluation of patient's response to treatment, examination of patient, obtaining history from patient or surrogate, ordering and performing treatments and interventions, ordering and review of laboratory studies, ordering and review of radiographic studies, pulse oximetry, re-evaluation of patient's condition and review of old charts      Critical Care performed: yes ____________________________________________   INITIAL IMPRESSION / ASSESSMENT AND PLAN / ED COURSE  Pertinent labs & imaging results that were available during my care of the patient were reviewed by me and considered in my medical decision making (see chart for details).   DDX: Asthma, copd, CHF, pna, ptx, malignancy, Pe, anemia   FUJIKO PICAZO is a 72 y.o. who presents to the ED with symptoms as described above.  She is afebrile hemodynamically stable on arrival.  Exam and history mixed at this point.  Does not seem quite consistent with heart failure the patient does report interval weight gain.  No edema or new effusions noted on chest x-ray.  States she is been compliant with her Xarelto.  Doubt PE.  Has had cough but no fever.  Mild leukocytosis but is also been on steroids.  No clear pneumonia on chest x-ray.  No evidence of pneumothorax.  Do suspect possible worsening underlying COPD.  Will give nebulizers and reassess.  Clinical Course as of Apr 07 715  Mon Mar 28, 2018  1601 Patient reassessed.  Does seem to be moving more air after nebulizer treatment.   [PR]  1624 Patient had to be hypoxic down to 84% after short ambulation.  Do think this is secondary to COPD exacerbation.  Will give Levaquin for productive cough and leukocytosis.  She discussed case with hospitalist for admission.   [PR]    Clinical Course User Index [PR] Willy Eddy, MD     As  part of my medical decision making, I reviewed the following data within the electronic MEDICAL RECORD NUMBER Nursing notes reviewed and incorporated, Labs reviewed, notes from prior ED visits.  ____________________________________________   FINAL CLINICAL IMPRESSION(S) / ED DIAGNOSES  Final diagnoses:  Acute respiratory failure with hypoxia (HCC)  COPD exacerbation (HCC)      NEW MEDICATIONS STARTED DURING THIS VISIT:  Discharge Medication List as of 03/30/2018 11:53 AM       Note:  This document was prepared using Dragon voice recognition software and may include unintentional dictation errors.    Willy Eddy, MD 03/28/18 1637    Willy Eddy, MD 04/06/18 [phone]

## 2018-03-28 NOTE — H&P (Signed)
[facility] - [place] at [facility]   PATIENT NAME: Kathleen [name]    MR#:  [id number]  DATE OF BIRTH:  1945/08/01  DATE OF ADMISSION:  03/28/2018  PRIMARY CARE PHYSICIAN: Leotis Shames, MD   REQUESTING/REFERRING PHYSICIAN:   CHIEF COMPLAINT:    HISTORY OF PRESENT ILLNESS:  Kathleen [name]  is a 72 y.o. 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 She noticed her stats have drop down to the 80s while she was ambulating at home and felt tachycardic. She also reports having 8 pounds weight gain in a week. She called her cardiologist at [facility] and was recommended to take torsemide 40 mg daily for three days. Patient states she dropped about 7 pounds and was feeling better how were she started noticing again increasing shortness of breath and hypoxia  Came to the emergency room her stats were in the upper 80s. She received IV Solu-Medrol. Her BNP is 77. She does not have any swelling in her legs. Chest x-ray negative for pulmonary edema. She is being admitted for acute on chronic hypoxic respiratory failure suspected due to COPD exacerbation with mild possible congestive heart failure systolic acute on chronic    PAST MEDICAL HISTORY:   Past Medical History:  Diagnosis Date  . Asthma   . Atypical atrial flutter (HCC)   . 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HCC)   . Chronic renal insufficiency   . 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 COPD (chronic obstructive pulmonary disease) (HCC)   . Gout   . Longstanding persistent atrial fibrillation   . Nonischemic cardiomyopathy (HCC)   . Obesity   . Pacemaker   . Spontaneous pneumothorax 2013    PAST SURGICAL HISTOIRY:    Past Surgical History:  Procedure Laterality Date  . ABDOMINAL HYSTERECTOMY    . ATRIAL FIBRILLATION ABLATION  07/20/2013   by Dr Christin Fudge  . AV nodal ablation  11/01/2013   by Dr Christin Fudge, repeated by Dr Wilford Grist  . BREAST BIOPSY Bilateral 1997   negative  . CARDIAC CATHETERIZATION    . CHOLECYSTECTOMY    . HERNIA REPAIR    . PACEMAKER INSERTION     MDT Viva CRT-P implanted by Dr Christin Fudge after AV nodal ablation,  LV lead could not be placed    SOCIAL HISTORY:   Social History   Tobacco Use  . Smoking status: Former Games developer  . Smokeless tobacco: Never Used  Substance Use Topics  . Alcohol use: No    Alcohol/week: 0.0 standard drinks    FAMILY HISTORY:   Family History  Problem Relation Age of Onset  . Breast cancer Mother 104       3 different times  . Breast cancer Cousin     DRUG ALLERGIES:   Allergies  Allergen Reactions  . Allopurinol Other (See Comments)    Reaction:  Dizziness   . Clindamycin Anaphylaxis and Hives  . Pneumococcal 13-Val Conj Vacc Itching, Swelling and Rash  . Dronedarone Rash  . Meperidine Nausea And Vomiting  . Penicillins Hives and Other (See Comments)    Has patient had a PCN reaction causing immediate rash, facial/tongue/throat swelling, SOB or lightheadedness with hypotension: No Has patient had a PCN reaction causing severe rash involving  mucus membranes or skin necrosis: No Has patient had a PCN reaction that required hospitalization No Has patient had a PCN reaction occurring within the last 10 years: No If all of the above answers are "NO", then may proceed with Cephalosporin use.  . Rosuvastatin Other (See Comments)    Reaction:  Muscle spasms   . Tetracycline Hives  . Adhesive [Tape] Rash  . Lovastatin Rash    Other reaction(s): Muscle Pain    REVIEW OF SYSTEMS:  Review of Systems  Constitutional: Negative for chills, fever and weight loss.  HENT: Negative for ear discharge, ear pain and nosebleeds.   Eyes: Negative for  blurred vision, pain and discharge.  Respiratory: Positive for shortness of breath and wheezing. Negative for sputum production and stridor.   Cardiovascular: Positive for leg swelling. Negative for chest pain, palpitations, orthopnea and PND.  Gastrointestinal: Negative for abdominal pain, diarrhea, nausea and vomiting.  Genitourinary: Negative for frequency and urgency.  Musculoskeletal: Negative for back pain and joint pain.  Neurological: Positive for weakness. Negative for sensory change, speech change and focal weakness.  Psychiatric/Behavioral: Negative for depression and hallucinations. The patient is not nervous/anxious.      MEDICATIONS AT HOME:   Prior to Admission medications   Medication Sig Start Date End Date Taking? Authorizing Provider  albuterol (PROVENTIL HFA;VENTOLIN HFA) 108 (90 Base) MCG/ACT inhaler Inhale 2 puffs into the lungs every 4 (four) hours as needed for wheezing or shortness of breath.    [provider]  albuterol (PROVENTIL) (2.5 MG/3ML) 0.083% nebulizer solution Take 3 mLs (2.5 mg total) by nebulization every 6 (six) hours as needed for wheezing or shortness of breath. 09/23/15   Gouru, Deanna Artis, MD  benzonatate (TESSALON) 200 MG capsule Take 200 mg by mouth 3 (three) times daily as needed for cough.     [provider]  bisacodyl (DULCOLAX) 5 MG EC tablet Take 1 tablet (5 mg total) by mouth daily. 10/23/17 10/23/18  Merrily Brittle, MD  budesonide-formoterol (SYMBICORT) 160-4.5 MCG/ACT inhaler Inhale 2 puffs into the lungs 2 (two) times daily.    [provider]  cholecalciferol (VITAMIN D) 1000 units tablet Take 2,000 Units by mouth at bedtime.    [provider]  colchicine 0.6 MG tablet Take 0.6-1.2 mg by mouth daily as needed (for gout flares).    [provider]  fluticasone (FLONASE) 50 MCG/ACT nasal spray Place 1 spray into both nostrils daily.     [provider]  guaiFENesin-dextromethorphan (ROBITUSSIN  DM) 100-10 MG/5ML syrup Take 10 mLs by mouth every 4 (four) hours as needed for cough. 09/23/15   Gouru, Deanna Artis, MD  levothyroxine (SYNTHROID, LEVOTHROID) 75 MCG tablet Take 75 mcg by mouth daily before breakfast.     [provider]  montelukast (SINGULAIR) 10 MG tablet Take 10 mg by mouth at bedtime.    [provider]  predniSONE (STERAPRED UNI-PAK 21 TAB) 10 MG (21) TBPK tablet Take 1 tablet (10 mg total) by mouth daily. Take 6 tablets by mouth for 1 day followed by  5 tablets by mouth for 1 day followed by  4 tablets by mouth for 1 day followed by  3 tablets by mouth for 1 day followed by  2 tablets by mouth for 1 day followed by  1 tablet by mouth for a day and stop 09/23/15   Ramonita Lab, MD  rivaroxaban (XARELTO) 20 MG TABS tablet Take 20 mg by mouth daily with supper.     [provider]  spironolactone (ALDACTONE) 50 MG tablet Take 75 mg by mouth daily.    [provider]  tiotropium (SPIRIVA) 18 MCG inhalation capsule Place 18 mcg into inhaler and inhale daily.    [provider]  torsemide (DEMADEX) 20 MG tablet Take 20 mg by mouth daily.    [provider]      VITAL SIGNS:  Blood pressure (!) 138/104, pulse 87, temperature 98.2 F (36.8 C), temperature source Oral, resp. rate 20, height 5' 1.5" (1.562 m), weight 102.5 kg, SpO2 (!) 89 %.  PHYSICAL EXAMINATION:  GENERAL:  72 y.o.-year-old patient lying in the bed with no acute distress. Obese EYES: Pupils equal, round, reactive to light and accommodation. No scleral icterus. Extraocular muscles intact.  HEENT: Head atraumatic, normocephalic. Oropharynx and nasopharynx clear.  NECK:  Supple, no jugular venous distention. No thyroid enlargement, no tenderness.  LUNGS: decreased breath sounds bilaterally, no wheezing, you left basilar rales,rhonchi or crepitation. No use of accessory muscles of respiration.  CARDIOVASCULAR: S1, S2 normal. No murmurs, rubs, or gallops.  ABDOMEN:  Soft, nontender, nondistended. Bowel sounds present. No organomegaly or mass.  EXTREMITIES: + pedal edema, no cyanosis, or clubbing.  NEUROLOGIC: Cranial nerves II through XII are intact. Muscle strength 5/5 in all extremities. Sensation intact. Gait not checked.  PSYCHIATRIC: The patient is alert and oriented x 3.  SKIN: No obvious rash, lesion, or ulcer.   LABORATORY PANEL:   CBC Recent Labs  Lab 03/28/18 1239  WBC 14.6*  HGB 15.6*  HCT 46.9*  PLT 221   ------------------------------------------------------------------------------------------------------------------  Chemistries  Recent Labs  Lab 03/28/18 1239  NA 142  K 3.2*  CL 102  CO2 26  GLUCOSE 168*  BUN 18  CREATININE 0.94  CALCIUM 9.3  AST 20  ALT 15  ALKPHOS 64  BILITOT 1.1   ------------------------------------------------------------------------------------------------------------------  Cardiac Enzymes Recent Labs  Lab 03/28/18 1239  TROPONINI <0.03   ------------------------------------------------------------------------------------------------------------------  RADIOLOGY:  Dg Chest 2 View  Result Date: 03/28/2018 CLINICAL DATA:  Mid chest pain for 1 week. Cough. History of COPD and atrial flutter. EXAM: CHEST - 2 VIEW COMPARISON:  Radiographs 09/20/2015.  CT 09/17/2015. FINDINGS: Interval revision of the left subclavian pacemaker with a new atrial lead. There is stable cardiomegaly. There is stable chronic lung disease with interstitial prominence of both lung bases. No edema, confluent airspace opacity, pleural effusion or pneumothorax. No acute osseous findings are evident. IMPRESSION: Interval pacemaker revision without demonstrated complication. Stable cardiomegaly and chronic lung disease. Electronically Signed   By: Carey Bullocks M.D.   On: 03/28/2018 13:28    EKG:   Paced rhythm IMPRESSION AND PLAN:  Kathleen [name]  is a 72 y.o. 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  1. acute on chronic hypoxic respiratory failure suspected due to COPD exacerbation and possible mild congestive heart failure acute on chronic systolic -IV Solu-Medrol, nebulizer, inhalers -assess for home oxygen during daytime. Patient uses nighttime oxygen -I will continue torsemide 20 mg daily. -BNP is 77 patient does not appear to be volume overloaded at present given she herself took 40 mg of torsemide daily for three days -echo of the heart done in May 2019 at [facility] [address] [address] showed MODERATE LV DYSFUNCTION (See above) EF 40% NORMAL LA PRESSURES WITH  NORMAL DIASTOLIC FUNCTION MILD RV SYSTOLIC DYSFUNCTION (See above) VALVULAR REGURGITATION: TRIVIAL MR, TRIVIAL PR, MILD TR  2. history of complete heart block with AV nodal ablation and pacemaker placement -patient follows with [facility]ology in [facility]  3.  chronic  Atrial flutter/fibrillation  -on PO Xarelto  4. DVT prophylaxis on Xarelto   Med rec has not been done by pharmacy tech. These make sure patient's medications reconciliation is done prior to discharge.   All the records are reviewed and case discussed with ED provider. Management plans discussed with the patient, family and they are in agreement.  CODE STATUS:full  TOTAL TIME TAKING CARE OF THIS PATIENT: 40 minutes.    Enedina Finner M.D on 03/28/2018 at 5:30 PM  Between 7am to 6pm - Pager - [phone]  After 6pm go to [email or web address] - password [email or web address]  [facility]  Office  [phone]  CC: Primary care physician; Leotis Shames, MD

## 2018-03-28 NOTE — Progress Notes (Addendum)
Pt did states that has a hx of fall and SOB but,  been refusing bed alarm but was educated about safety. Will continue to monitor.  Update 2053: Pt states that she wear oxygen 3 liters at night. Notify [facility]. Will continue to monitor.  Update 2113: Doctor Anne Hahn place and order for oxygen 3 liter. Pt was place on 3 liter oxygen. Will continue to monitor.  Update 2310: Pt did agreed to be on bed alarm. Will continue to monitor.

## 2018-03-29 MED ORDER — POTASSIUM CHLORIDE CRYS ER 20 MEQ PO TBCR
20.0000 meq | EXTENDED_RELEASE_TABLET | Freq: Once | ORAL | Status: AC
  Administered 2018-03-29: 20 meq via ORAL
  Filled 2018-03-29: qty 1

## 2018-03-29 MED ORDER — FUROSEMIDE 10 MG/ML IJ SOLN
40.0000 mg | Freq: Every day | INTRAMUSCULAR | Status: DC
  Administered 2018-03-29 – 2018-03-30 (×2): 40 mg via INTRAVENOUS
  Filled 2018-03-29 (×2): qty 4

## 2018-03-29 MED ORDER — LISINOPRIL 5 MG PO TABS
5.0000 mg | ORAL_TABLET | Freq: Every day | ORAL | Status: DC
  Filled 2018-03-29 (×2): qty 1

## 2018-03-29 NOTE — Care Management Note (Signed)
Case Management Note  Patient Details  Name: Kathleen [name] MRN: [id number] Date of Birth: [birth year]  Subjective/Objective:                    Action/Plan:   Expected Discharge Date:  03/31/18               Expected Discharge Plan:  Home/Self Care  In-House Referral:     Discharge planning Services  CM Consult, HF Clinic  Post Acute Care Choice:    Choice offered to:     DME Arranged:    DME Agency:     HH Arranged:    HH Agency:     Status of Service:  In process, will continue to follow  If discussed at Long Length of Stay Meetings, dates discussed:    Additional Comments:  [name], RN 03/29/2018, 9:53 AM

## 2018-03-29 NOTE — Progress Notes (Addendum)
Pt states that that she talked to doctor Enedina Finner and states that pt will be on IV lasix and breathing treatment. Pt was upset that non of this meds were ordered. Pt also states that she havent take her xarelto tonight, but will start tomorrow night. [facility] was called and talked to Doctor Anne Hahn and states pt concerns. Doctor Anne Hahn ordered breathing treatment scheduled and xarelto was ordered to be given tonight. Doctor Anne Hahn states that pt BNP was at 77 and chest x-ray was negative for anything acute and that might  the reason why lasix IV was not ordered. Pt was informed about this matter and she was receptive about the information. Will continue to monitor.

## 2018-03-29 NOTE — Progress Notes (Addendum)
Pharmacist [name]) was called about pt medication reconciliation and states will send pharmacy tech to do the reconciliation. At 2300 pharmacy (talked to [facility]) and states will send pharmacy tech to do the reconciliation. Will continue to monitor.

## 2018-03-29 NOTE — Progress Notes (Signed)
Diuresing well with lasix,steriods continue,remains on room air without distress.Chronic use of noctunal oxygen 3L.

## 2018-03-29 NOTE — Care Management Note (Addendum)
Case Management Note  Patient Details  Name: Kathleen [name] MRN: [id number] Date of Birth: [birth year]  Subjective/Objective:    Independent from home; lives alone.  Admitted with acute on chronic CHF.  Has noticed heart rate increasing to the 120's and increased SOB over the past couple of weeks.  Nocturnal O2 in place through [employer].  Will need qualifying O2 sats for continuous home O2.  [name] Noel, RN notified.  Patient reports sats going down to 84% at PCP yesterday with ambulation.  Has a functioning scale at home.  Agreeable to appointment with heart failure clinic.  Clinic made aware of appointment needs.  Chronic Xarelto and torsemide.   Has a nebulizer at home.  Current with PCP and denies difficulties with tarnsportation or obtaining medications.              Action/Plan:Declined home health services.       Expected Discharge Date:  03/31/18               Expected Discharge Plan:  Home/Self Care  In-House Referral:     Discharge planning Services  CM Consult, HF Clinic  Post Acute Care Choice:    Choice offered to:     DME Arranged:    DME Agency:     HH Arranged:    HH Agency:     Status of Service:  In process, will continue to follow  If discussed at Long Length of Stay Meetings, dates discussed:    Additional Comments:  [name], RN 03/29/2018, 9:43 AM

## 2018-03-29 NOTE — Progress Notes (Signed)
[facility] - [place] at [facility]   PATIENT NAME: Kathleen [name]    MR#:  [id number]  DATE OF BIRTH:  11-10-1945  SUBJECTIVE:  CHIEF COMPLAINT:   Chief Complaint  Patient presents with  . Chest Pain  Seen and evaluated today Has wheezing and difficulty breathing Patient says she has gained weight for the last couple of weeks No complaints of chest pain today  REVIEW OF SYSTEMS:    ROS  CONSTITUTIONAL: No documented fever. No fatigue, weakness. No weight gain, no weight loss.  EYES: No blurry or double vision.  ENT: No tinnitus. No postnasal drip. No redness of the oropharynx.  RESPIRATORY: No cough, has wheeze, no hemoptysis. Has dyspnea.  CARDIOVASCULAR: No chest pain. No orthopnea. No palpitations. No syncope.  GASTROINTESTINAL: No nausea, no vomiting or diarrhea. No abdominal pain. No melena or hematochezia.  GENITOURINARY: No dysuria or hematuria.  ENDOCRINE: No polyuria or nocturia. No heat or cold intolerance.  HEMATOLOGY: No anemia. No bruising. No bleeding.  INTEGUMENTARY: No rashes. No lesions.  MUSCULOSKELETAL: No arthritis. No swelling. No gout.  NEUROLOGIC: No numbness, tingling, or ataxia. No seizure-type activity.  PSYCHIATRIC: No anxiety. No insomnia. No ADD.   DRUG ALLERGIES:   Allergies  Allergen Reactions  . Allopurinol Other (See Comments)    Reaction:  Dizziness   . Clindamycin Anaphylaxis and Hives  . Pneumococcal 13-Val Conj Vacc Itching, Swelling and Rash  . Dronedarone Rash  . Meperidine Nausea And Vomiting  . Penicillins Hives and Other (See Comments)    Has patient had a PCN reaction causing immediate rash, facial/tongue/throat swelling, SOB or lightheadedness with hypotension: No Has patient had a PCN reaction causing severe rash involving mucus membranes or skin necrosis: No Has patient had a PCN reaction that required hospitalization No Has patient had a PCN reaction occurring within the last 10 years: No If all of the  above answers are "NO", then may proceed with Cephalosporin use.  . Rosuvastatin Other (See Comments)    Reaction:  Muscle spasms   . Tetracycline Hives  . Adhesive [Tape] Rash  . Lovastatin Rash    Other reaction(s): Muscle Pain    VITALS:  Blood pressure (!) 145/73, pulse 74, temperature 98.2 F (36.8 C), temperature source Oral, resp. rate 16, height 5' 0.5" (1.537 m), weight 100.5 kg, SpO2 96 %.  PHYSICAL EXAMINATION:   Physical Exam  GENERAL:  72 y.o.-year-old patient lying in the bed with no acute distress.  EYES: Pupils equal, round, reactive to light and accommodation. No scleral icterus. Extraocular muscles intact.  HEENT: Head atraumatic, normocephalic. Oropharynx and nasopharynx clear.  NECK:  Supple, no jugular venous distention. No thyroid enlargement, no tenderness.  LUNGS: Decreased breath sounds bilaterally, basal crepitations heard. No use of accessory muscles of respiration.  CARDIOVASCULAR: S1, S2 normal. No murmurs, rubs, or gallops.  ABDOMEN: Soft, nontender, nondistended. Bowel sounds present. No organomegaly or mass.  EXTREMITIES: No cyanosis, clubbing  has edema b/l.    NEUROLOGIC: Cranial nerves II through XII are intact. No focal Motor or sensory deficits b/l.   PSYCHIATRIC: The patient is alert and oriented x 3.  SKIN: No obvious rash, lesion, or ulcer.   LABORATORY PANEL:   CBC Recent Labs  Lab 03/28/18 1239  WBC 14.6*  HGB 15.6*  HCT 46.9*  PLT 221   ------------------------------------------------------------------------------------------------------------------ Chemistries  Recent Labs  Lab 03/28/18 1239  NA 142  K 3.2*  CL 102  CO2 26  GLUCOSE 168*  BUN 18  CREATININE 0.94  CALCIUM 9.3  AST 20  ALT 15  ALKPHOS 64  BILITOT 1.1   ------------------------------------------------------------------------------------------------------------------  Cardiac Enzymes Recent Labs  Lab 03/28/18 1239  TROPONINI <0.03    ------------------------------------------------------------------------------------------------------------------  RADIOLOGY:  Dg Chest 2 View  Result Date: 03/28/2018 CLINICAL DATA:  Mid chest pain for 1 week. Cough. History of COPD and atrial flutter. EXAM: CHEST - 2 VIEW COMPARISON:  Radiographs 09/20/2015.  CT 09/17/2015. FINDINGS: Interval revision of the left subclavian pacemaker with a new atrial lead. There is stable cardiomegaly. There is stable chronic lung disease with interstitial prominence of both lung bases. No edema, confluent airspace opacity, pleural effusion or pneumothorax. No acute osseous findings are evident. IMPRESSION: Interval pacemaker revision without demonstrated complication. Stable cardiomegaly and chronic lung disease. Electronically Signed   By: Carey Bullocks M.D.   On: 03/28/2018 13:28     ASSESSMENT AND PLAN:   72 year old female patient with history of complete heart block, AV nodal ablation in the past, pacemaker, COPD on night oxygen, boot, nonischemic cardia myopathy, systolic heart failure currently under hospitalist service for increased weight gain and shortness of breath  -Acute on chronic systolic heart failure exacerbation Discontinue torsemide Start patient on IV Lasix 40 mg daily Monitor electrolytes Daily body weight Input output chart  Recent echo reviewed Start low-dose ACE inhibitor  -Acute on chronic hypoxic respiratory failure Oxygen via nasal cannula IV Solu-Medrol and nebulization treatments  -Acute COPD exacerbation Nebulization treatments and steroids  -Chronic atrial flutter/fibrillation Continue oral Xarelto for anticoagulation  All the records are reviewed and case discussed with Care Management/Social Worker. Management plans discussed with the patient, family and they are in agreement.  CODE STATUS: Full code  DVT Prophylaxis: SCDs  TOTAL TIME TAKING CARE OF THIS PATIENT: 34 minutes.   POSSIBLE D/C IN 2 to  3 DAYS, DEPENDING ON CLINICAL CONDITION.  Ihor Austin M.D on 03/29/2018 at 2:05 PM  Between 7am to 6pm - Pager - [phone]  After 6pm go to [email or web address] - password [email or web address]  [facility]  Hospitalists  Office  [phone]  CC: Primary care physician; Leotis Shames, MD  Note: This dictation was prepared with Dragon dictation along with smaller phrase technology. Any transcriptional errors that result from this process are unintentional.

## 2018-03-29 NOTE — Progress Notes (Signed)
Completed patients med rec. Pt weights herself daily and takes extra torsemide for weight gain. Patient does not appear to be volume overloaded on admission. Patient main issue is acute on chronic hypoxic respiratory failure due to COPD.  Pt w/ EF of 40% Do not see an ACE or BB on pt medlist. Will need to follow up w/ pt if there is a particular reason why

## 2018-03-29 NOTE — Progress Notes (Signed)
SATURATION QUALIFICATIONS: (This note is used to comply with regulatory documentation for home oxygen)  Patient Saturations on Room Air at Rest = 96%  Patient Saturations on Room Air while Ambulating = 91%  Patient Saturations on 3 Liters of oxygen while Ambulating =95%  Please briefly explain why patient needs home oxygen:

## 2018-03-29 NOTE — Plan of Care (Signed)
  Problem: Health Behavior/Discharge Planning: Goal: Ability to manage health-related needs will improve Outcome: Progressing   Problem: Pain Managment: Goal: General experience of comfort will improve Outcome: Progressing   Problem: Safety: Goal: Ability to remain free from injury will improve Outcome: Progressing   

## 2018-03-30 LAB — CBC
HCT: 43.9 % (ref 36.0–46.0)
HEMOGLOBIN: 14.3 g/dL (ref 12.0–15.0)
MCH: 28.7 pg (ref 26.0–34.0)
MCHC: 32.6 g/dL (ref 30.0–36.0)
MCV: 88.2 fL (ref 80.0–100.0)
NRBC: 0 % (ref 0.0–0.2)
Platelets: 216 10*3/uL (ref 150–400)
RBC: 4.98 MIL/uL (ref 3.87–5.11)
RDW: 13.9 % (ref 11.5–15.5)
WBC: 13.4 10*3/uL — ABNORMAL HIGH (ref 4.0–10.5)

## 2018-03-30 LAB — BASIC METABOLIC PANEL
ANION GAP: 13 (ref 5–15)
BUN: 31 mg/dL — AB (ref 8–23)
CALCIUM: 9.3 mg/dL (ref 8.9–10.3)
CHLORIDE: 100 mmol/L (ref 98–111)
CO2: 28 mmol/L (ref 22–32)
Creatinine, Ser: 0.87 mg/dL (ref 0.44–1.00)
GFR calc Af Amer: >60 mL/min (ref >60)
GFR calc non Af Amer: >60 mL/min (ref >60)
GLUCOSE: 161 mg/dL — AB (ref 70–99)
POTASSIUM: 3.7 mmol/L (ref 3.5–5.1)
Sodium: 141 mmol/L (ref 135–145)

## 2018-03-30 MED ORDER — PREDNISONE 2.5 MG PO TABS: 7.5000 mg | ORAL_TABLET | Freq: Every day | ORAL | 0 refills | Status: AC

## 2018-03-30 NOTE — Plan of Care (Signed)

## 2018-03-30 NOTE — Discharge Summary (Signed)
[facility] - [place] at [facility]   PATIENT NAME: Kathleen [name]    MR#:  [id number]  DATE OF BIRTH:  1946/02/25  DATE OF ADMISSION:  03/28/2018 ADMITTING PHYSICIAN: Enedina Finner, MD  DATE OF DISCHARGE: 03/30/2018  PRIMARY CARE PHYSICIAN: Leotis Shames, MD   ADMISSION DIAGNOSIS:  COPD exacerbation (HCC) [J44.1] Acute respiratory failure with hypoxia (HCC) [J96.01] Acute on chronic systolic heart failure exacerbation DISCHARGE DIAGNOSIS:  Active Problems:   Acute on chronic respiratory failure with hypoxemia (HCC) Acute on chronic systolic heart failure exacerbation Acute COPD exacerbation Chronic atrial flutter  SECONDARY DIAGNOSIS:   Past Medical History:  Diagnosis Date  . Asthma   . Atypical atrial flutter (HCC)   . 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HCC)   . Chronic renal insufficiency   . 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 COPD (chronic obstructive pulmonary disease) (HCC)   . Gout   . Longstanding persistent atrial fibrillation   . Nonischemic cardiomyopathy (HCC)   . Obesity   . Pacemaker   . Spontaneous pneumothorax 2013     ADMITTING HISTORY Kathleen [name]  is a 72 y.o. 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 She noticed her stats have drop down to the 80s while she was ambulating at home and felt tachycardic. She also reports having 8 pounds weight gain in a week. She called her cardiologist at [facility] and was recommended to take torsemide 40 mg daily for three days. Patient states she dropped about 7 pounds and was feeling better how were she started noticing again increasing shortness of breath and hypoxiaCame to the emergency room her stats were  in the upper 80s. She received IV Solu-Medrol. Her BNP is 77.She does not have any swelling in her legs. Chest x-ray negative for pulmonary edema. She is being admitted for acute on chronic hypoxic respiratory failure suspected due to COPD exacerbation with mild possible congestive heart failure systolic acute on chronic  HOSPITAL COURSE:  Patient was admitted to telemetry.  Patient diuresed with IV Lasix.  She had gained weight secondary to fluid retention.  Patient received IV Solu-Medrol and nebulization treatments.  Shortness of breath improved.  Body weight decreased after diuresis.  She uses oxygen at bedtime.  Patient will be discharged to home on steroids and to continue torsemide for diuresis.  She recently had an echo as outpatient which was reviewed.  Patient will continue her cardiac medications.  She completed course of Zithromax antibiotic recently.  Follow-up with primary care physician in the clinic.  CONSULTS OBTAINED:  None  DRUG ALLERGIES:   Allergies  Allergen Reactions  . Allopurinol Other (See Comments)    Reaction:  Dizziness   . Clindamycin Anaphylaxis and Hives  . Pneumococcal 13-Val Conj Vacc Itching, Swelling and Rash  . Dronedarone Rash  . Meperidine Nausea And Vomiting  . Penicillins Hives and Other (See Comments)    Has patient had a PCN reaction causing immediate rash, facial/tongue/throat swelling, SOB or lightheadedness with hypotension: No Has patient had a PCN reaction causing severe rash involving mucus membranes or skin necrosis: No Has patient had a PCN reaction that required hospitalization No Has patient had a PCN reaction occurring within the last 10 years: No If all of  the above answers are "NO", then may proceed with Cephalosporin use.  . Rosuvastatin Other (See Comments)    Reaction:  Muscle spasms   . Tetracycline Hives  . Adhesive [Tape] Rash  . Lovastatin Rash    Other reaction(s): Muscle Pain    DISCHARGE MEDICATIONS:   Allergies as of  03/30/2018      Reactions   Allopurinol Other (See Comments)   Reaction:  Dizziness    Clindamycin Anaphylaxis, Hives   Pneumococcal 13-val Conj Vacc Itching, Swelling, Rash   Dronedarone Rash   Meperidine Nausea And Vomiting   Penicillins Hives, Other (See Comments)   Has patient had a PCN reaction causing immediate rash, facial/tongue/throat swelling, SOB or lightheadedness with hypotension: No Has patient had a PCN reaction causing severe rash involving mucus membranes or skin necrosis: No Has patient had a PCN reaction that required hospitalization No Has patient had a PCN reaction occurring within the last 10 years: No If all of the above answers are "NO", then may proceed with Cephalosporin use.   Rosuvastatin Other (See Comments)   Reaction:  Muscle spasms    Tetracycline Hives   Adhesive [tape] Rash   Lovastatin Rash   Other reaction(s): Muscle Pain      Medication List    TAKE these medications   albuterol 108 (90 Base) MCG/ACT inhaler Commonly known as:  PROVENTIL HFA;VENTOLIN HFA Inhale 2 puffs into the lungs every 4 (four) hours as needed for wheezing or shortness of breath.   albuterol (2.5 MG/3ML) 0.083% nebulizer solution Commonly known as:  PROVENTIL Take 3 mLs (2.5 mg total) by nebulization every 6 (six) hours as needed for wheezing or shortness of breath.   azithromycin 250 MG tablet Commonly known as:  ZITHROMAX Take 250 mg by mouth daily.   benzonatate 200 MG capsule Commonly known as:  TESSALON Take 200 mg by mouth 3 (three) times daily as needed for cough.   bisacodyl 5 MG EC tablet Commonly known as:  DULCOLAX Take 1 tablet (5 mg total) by mouth daily. What changed:    when to take this  reasons to take this   budesonide-formoterol 160-4.5 MCG/ACT inhaler Commonly known as:  SYMBICORT Inhale 2 puffs into the lungs 2 (two) times daily.   cholecalciferol 1000 units tablet Commonly known as:  VITAMIN D Take 2,000 Units by mouth at bedtime.    colchicine 0.6 MG tablet Take 0.6-1.2 mg by mouth daily as needed (for gout flares).   diclofenac sodium 1 % Gel Commonly known as:  VOLTAREN Apply 2 g topically 4 (four) times daily as needed (pain).   docusate sodium 100 MG capsule Commonly known as:  COLACE Take 100 mg by mouth 2 (two) times daily.   fluticasone 50 MCG/ACT nasal spray Commonly known as:  FLONASE Place 1 spray into both nostrils daily.   levothyroxine 75 MCG tablet Commonly known as:  SYNTHROID, LEVOTHROID Take 75 mcg by mouth daily before breakfast.   montelukast 10 MG tablet Commonly known as:  SINGULAIR Take 10 mg by mouth at bedtime.   predniSONE 2.5 MG tablet Commonly known as:  DELTASONE Take 3 tablets (7.5 mg total) by mouth daily for 7 days. What changed:    medication strength  how much to take  when to take this  additional instructions  Another medication with the same name was removed. Continue taking this medication, and follow the directions you see here.   SPIRIVA RESPIMAT 2.5 MCG/ACT Aers Generic drug:  Tiotropium Bromide Monohydrate  Inhale 2 puffs into the lungs daily.   spironolactone 50 MG tablet Commonly known as:  ALDACTONE Take 75 mg by mouth daily.   torsemide 20 MG tablet Commonly known as:  DEMADEX Take 20 mg by mouth daily.   ULORIC 80 MG Tabs Generic drug:  Febuxostat Take 80 mg by mouth daily.   XARELTO 20 MG Tabs tablet Generic drug:  rivaroxaban Take 20 mg by mouth daily with supper.       Today  Patient seen and evaluated today Has decreased shortness of breath No wheezing No chest pain  VITAL SIGNS:  Blood pressure (!) 154/75, pulse 74, temperature 97.8 F (36.6 C), temperature source Oral, resp. rate 16, height 5' 0.5" (1.537 m), weight 100 kg, SpO2 97 %.  I/O:    Intake/Output Summary (Last 24 hours) at 03/30/2018 1357 Last data filed at 03/30/2018 1126 Gross per 24 hour  Intake 600 ml  Output 3600 ml  Net -3000 ml    PHYSICAL  EXAMINATION:  Physical Exam  GENERAL:  72 y.o.-year-old patient lying in the bed with no acute distress.  LUNGS: Normal breath sounds bilaterally, no wheezing, rales,rhonchi or crepitation. No use of accessory muscles of respiration.  CARDIOVASCULAR: S1, S2 normal. No murmurs, rubs, or gallops.  ABDOMEN: Soft, non-tender, non-distended. Bowel sounds present. No organomegaly or mass.  NEUROLOGIC: Moves all 4 extremities. PSYCHIATRIC: The patient is alert and oriented x 3.  SKIN: No obvious rash, lesion, or ulcer.   DATA REVIEW:   CBC Recent Labs  Lab 03/30/18 0543  WBC 13.4*  HGB 14.3  HCT 43.9  PLT 216    Chemistries  Recent Labs  Lab 03/28/18 1239 03/30/18 0543  NA 142 141  K 3.2* 3.7  CL 102 100  CO2 26 28  GLUCOSE 168* 161*  BUN 18 31*  CREATININE 0.94 0.87  CALCIUM 9.3 9.3  AST 20  --   ALT 15  --   ALKPHOS 64  --   BILITOT 1.1  --     Cardiac Enzymes Recent Labs  Lab 03/28/18 1239  TROPONINI <0.03    Microbiology Results  Results for orders placed or performed during the hospital encounter of 09/08/15  Rapid Influenza A&B Antigens ([facility] only)     Status: None   Collection Time: 09/08/15  5:21 PM  Result Value Ref Range Status   Influenza A ([facility]) NEGATIVE NEGATIVE Final   Influenza B ([facility]) NEGATIVE NEGATIVE Final  Culture, blood (Routine X 2) w Reflex to ID Panel     Status: None   Collection Time: 09/08/15  5:21 PM  Result Value Ref Range Status   Specimen Description BLOOD LEFT ANTECUBITAL  Final   Special Requests BOTTLES DRAWN AEROBIC AND ANAEROBIC  5CC  Final   Culture NO GROWTH 5 DAYS  Final   Report Status 09/13/2015 FINAL  Final  Culture, blood (Routine X 2) w Reflex to ID Panel     Status: None   Collection Time: 09/08/15  5:21 PM  Result Value Ref Range Status   Specimen Description BLOOD RIGHT ANTECUBITAL  Final   Special Requests BOTTLES DRAWN AEROBIC AND ANAEROBIC  4CC  Final   Culture NO GROWTH 5 DAYS  Final   Report Status  09/13/2015 FINAL  Final    RADIOLOGY:  No results found.  Follow up with PCP in 1 week.  Management plans discussed with the patient, family and they are in agreement.  CODE STATUS: Full code    Code  Status Orders  (From admission, onward)         Start     Ordered   03/28/18 1837  Full code  Continuous     03/28/18 1836        Code Status History    Date Active Date Inactive Code Status Order ID Comments User Context   09/17/2015 1847 09/23/2015 1751 Full Code [id number]  Milagros Loll, MD ED   09/08/2015 2032 09/11/2015 1958 Full Code [id number]  Katharina Caper, MD Inpatient    Advance Directive Documentation     Most Recent Value  Type of Advance Directive  Healthcare Power of Attorney  Pre-existing out of facility DNR order (yellow form or pink MOST form)  -  "MOST" Form in Place?  -      TOTAL TIME TAKING CARE OF THIS PATIENT ON DAY OF DISCHARGE: more than 34 minutes.   Ihor Austin M.D on 03/30/2018 at 1:57 PM  Between 7am to 6pm - Pager - [phone]  After 6pm go to [email or web address] - password [email or web address]  [facility] [place] [facility]  Office  [phone]  CC: Primary care physician; Leotis Shames, MD  Note: This dictation was prepared with Dragon dictation along with smaller phrase technology. Any transcriptional errors that result from this process are unintentional.

## 2018-03-30 NOTE — Progress Notes (Signed)
Patient has been refusing to take lisinopril while in the hospital. [name] Flesher to discuss this with patient. She states that both her husband and her best friend had a cough with it and she doesn't want to take. Patient also states that her BP runs "low" at home. She reports BP of 110-118/70's at home but denies symptons of hypotension. Asked patient about beta blocker use. Not sure if she has been on, and why she is not on them. Explained that the ARB class of medication (lostartan) has a very low incidence of cough. Encouraged her to ask cardiologist about using ARB and/or BB.  Patient may potentially not be on BB due to COPD. Her EF is 40%. Therefore if she could tolerate, she would benefit from ARB/BB. Discussed the benefits with patient. Encourage patient to talk to cardiologist about this  Olene Floss, Pharm.D, BCPS Clinical Pharmacist

## 2018-03-30 NOTE — Plan of Care (Signed)
  Problem: Education: Goal: Knowledge of General Education information will improve Description Including pain rating scale, medication(s)/side effects and non-pharmacologic comfort measures Outcome: Progressing   Problem: Health Behavior/Discharge Planning: Goal: Ability to manage health-related needs will improve Outcome: Progressing   Problem: Clinical Measurements: Goal: Diagnostic test results will improve Outcome: Progressing   Problem: Pain Managment: Goal: General experience of comfort will improve Outcome: Progressing   Problem: Safety: Goal: Ability to remain free from injury will improve Outcome: Progressing   

## 2018-04-05 ENCOUNTER — Encounter: Payer: Self-pay | Admitting: Family

## 2018-04-05 ENCOUNTER — Telehealth: Payer: Self-pay

## 2018-04-05 ENCOUNTER — Ambulatory Visit: Payer: Medicare Other | Attending: Family | Admitting: Family

## 2018-04-05 DIAGNOSIS — Z792 Long term (current) use of antibiotics: Secondary | ICD-10-CM | POA: Insufficient documentation

## 2018-04-05 DIAGNOSIS — Z6841 Body Mass Index (BMI) 40.0 and over, adult: Secondary | ICD-10-CM | POA: Insufficient documentation

## 2018-04-05 DIAGNOSIS — I130 Hypertensive heart and chronic kidney disease with heart failure and stage 1 through stage 4 chronic kidney disease, or unspecified chronic kidney disease: Secondary | ICD-10-CM | POA: Insufficient documentation

## 2018-04-05 DIAGNOSIS — M109 Gout, unspecified: Secondary | ICD-10-CM | POA: Insufficient documentation

## 2018-04-05 DIAGNOSIS — Z885 Allergy status to narcotic agent status: Secondary | ICD-10-CM | POA: Insufficient documentation

## 2018-04-05 DIAGNOSIS — Z888 Allergy status to other drugs, medicaments and biological substances status: Secondary | ICD-10-CM | POA: Insufficient documentation

## 2018-04-05 DIAGNOSIS — Z950 Presence of cardiac pacemaker: Secondary | ICD-10-CM | POA: Diagnosis not present

## 2018-04-05 DIAGNOSIS — J441 Chronic obstructive pulmonary disease with (acute) exacerbation: Secondary | ICD-10-CM | POA: Insufficient documentation

## 2018-04-05 DIAGNOSIS — E669 Obesity, unspecified: Secondary | ICD-10-CM | POA: Insufficient documentation

## 2018-04-05 DIAGNOSIS — Z79890 Hormone replacement therapy (postmenopausal): Secondary | ICD-10-CM | POA: Diagnosis not present

## 2018-04-05 DIAGNOSIS — Z79899 Other long term (current) drug therapy: Secondary | ICD-10-CM | POA: Insufficient documentation

## 2018-04-05 DIAGNOSIS — Z880 Allergy status to penicillin: Secondary | ICD-10-CM | POA: Diagnosis not present

## 2018-04-05 DIAGNOSIS — Z9049 Acquired absence of other specified parts of digestive tract: Secondary | ICD-10-CM | POA: Insufficient documentation

## 2018-04-05 DIAGNOSIS — I10 Essential (primary) hypertension: Secondary | ICD-10-CM

## 2018-04-05 DIAGNOSIS — Z87891 Personal history of nicotine dependence: Secondary | ICD-10-CM | POA: Diagnosis not present

## 2018-04-05 DIAGNOSIS — I482 Chronic atrial fibrillation, unspecified: Secondary | ICD-10-CM | POA: Diagnosis not present

## 2018-04-05 DIAGNOSIS — Z881 Allergy status to other antibiotic agents status: Secondary | ICD-10-CM | POA: Insufficient documentation

## 2018-04-05 DIAGNOSIS — Z9889 Other specified postprocedural states: Secondary | ICD-10-CM | POA: Insufficient documentation

## 2018-04-05 DIAGNOSIS — J449 Chronic obstructive pulmonary disease, unspecified: Secondary | ICD-10-CM | POA: Diagnosis not present

## 2018-04-05 DIAGNOSIS — I5022 Chronic systolic (congestive) heart failure: Secondary | ICD-10-CM | POA: Diagnosis present

## 2018-04-05 DIAGNOSIS — I4821 Permanent atrial fibrillation: Secondary | ICD-10-CM

## 2018-04-05 DIAGNOSIS — Z7901 Long term (current) use of anticoagulants: Secondary | ICD-10-CM | POA: Insufficient documentation

## 2018-04-05 DIAGNOSIS — J410 Simple chronic bronchitis: Secondary | ICD-10-CM

## 2018-04-05 NOTE — Progress Notes (Signed)
Patient ID: Kathleen [name], female    DOB: Sep 03, 1945, 72 y.o.   MRN: [id number]  HPI  Kathleen [name] is a 72 y/o female with a history of HTN, CKD, atrial fibrillation, asthma, COPD, BOOP, former tobacco use and chronic heart failure.   Echo report from 10/22/17 reviewed and showed an EF of 40% along with mild TR.   RHC/LHC done 02/01/17 showed cardiac index of 2.0 and pulmonary wedge pressure of 10.  Admitted 03/28/18 due to COPD exacerbation along with mild HF exacerbation. Given IV lasix along with IV solu-medrol. Transitioned to oral therapy and discharged after 2 days.   She presents today for her initial visit with a chief complaint of minimal shortness of breath upon moderate exertion. She describes this as chronic in nature having been present for several years. She has associated fatigue, dizziness, cough, wheezing, chest pain, palpitations, easy bruising and difficulty sleeping along with this. She denies any abdominal distention, pedal edema or weight gain.   Past Medical History:  Diagnosis Date  . Asthma   . Atypical atrial flutter (HCC)   . 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HCC)   . CHF (congestive heart failure) (HCC)   . Chronic renal insufficiency   . 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 COPD (chronic obstructive pulmonary disease) (HCC)   . Gout   . Hypertension   . Longstanding persistent atrial fibrillation   . Nonischemic cardiomyopathy (HCC)   . Obesity   . Pacemaker   . Spontaneous pneumothorax 2013   Past Surgical History:  Procedure Laterality Date  . ABDOMINAL HYSTERECTOMY    . ATRIAL FIBRILLATION ABLATION  07/20/2013   by Dr Christin Fudge  . AV nodal ablation  11/01/2013   by Dr Christin Fudge, repeated by Dr Wilford Grist  . BREAST BIOPSY Bilateral 1997   negative  . CARDIAC CATHETERIZATION    . CHOLECYSTECTOMY    . HERNIA REPAIR    . PACEMAKER INSERTION  06/2017   MDT Viva CRT-P implanted by Dr Christin Fudge after AV nodal ablation,   LV lead could not be placed   Family History  Problem Relation Age of Onset  . Breast cancer Mother 62       3 different times  . Breast cancer Cousin    Social History   Tobacco Use  . Smoking status: Former Games developer  . Smokeless tobacco: Never Used  Substance Use Topics  . Alcohol use: No    Alcohol/week: 0.0 standard drinks   Allergies  Allergen Reactions  . Allopurinol Other (See Comments)    Reaction:  Dizziness   . Clindamycin Anaphylaxis and Hives  . Pneumococcal 13-Val Conj Vacc Itching, Swelling and Rash  . Dronedarone Rash  . Meperidine Nausea And Vomiting  . Penicillins Hives and Other (See Comments)    Has patient had a PCN reaction causing immediate rash, facial/tongue/throat swelling, SOB or lightheadedness with hypotension: No Has patient had a PCN reaction causing severe rash involving mucus membranes or skin necrosis: No Has patient had a PCN reaction that required hospitalization No Has patient had a PCN reaction occurring within the last 10 years: No If all of the above answers are "NO", then may proceed with Cephalosporin use.  . Rosuvastatin Other (See Comments)    Reaction:  Muscle spasms   . Tetracycline Hives  . Adhesive [Tape] Rash  . Lovastatin Rash    Other reaction(s): Muscle Pain   Prior to Admission medications   Medication Sig Start  Date End Date Taking? Authorizing Provider  albuterol (PROVENTIL HFA;VENTOLIN HFA) 108 (90 Base) MCG/ACT inhaler Inhale 2 puffs into the lungs every 4 (four) hours as needed for wheezing or shortness of breath.   Yes [provider]  albuterol (PROVENTIL) (2.5 MG/3ML) 0.083% nebulizer solution Take 3 mLs (2.5 mg total) by nebulization every 6 (six) hours as needed for wheezing or shortness of breath. 09/23/15  Yes Gouru, Deanna Artis, MD  azithromycin (ZITHROMAX) 250 MG tablet Take 250 mg by mouth daily.   Yes [provider]  bisacodyl (DULCOLAX) 5 MG EC tablet Take 1 tablet (5 mg total) by mouth  daily. Patient taking differently: Take 5 mg by mouth daily as needed for moderate constipation.  10/23/17 10/23/18 Yes Merrily Brittle, MD  budesonide-formoterol [facility]) 160-4.5 MCG/ACT inhaler Inhale 2 puffs into the lungs 2 (two) times daily.   Yes [provider]  cholecalciferol (VITAMIN D) 1000 units tablet Take 2,000 Units by mouth at bedtime.   Yes [provider]  colchicine 0.6 MG tablet Take 0.6-1.2 mg by mouth daily as needed (for gout flares).   Yes [provider]  diclofenac sodium (VOLTAREN) 1 % GEL Apply 2 g topically 4 (four) times daily as needed (pain).   Yes [provider]  docusate sodium (COLACE) 100 MG capsule Take 100 mg by mouth 2 (two) times daily.   Yes [provider]  fluticasone (FLONASE) 50 MCG/ACT nasal spray Place 1 spray into both nostrils daily.    Yes [provider]  levothyroxine (SYNTHROID, LEVOTHROID) 75 MCG tablet Take 75 mcg by mouth daily before breakfast.    Yes [provider]  montelukast (SINGULAIR) 10 MG tablet Take 10 mg by mouth at bedtime.   Yes [provider]  predniSONE (DELTASONE) 2.5 MG tablet Take 3 tablets (7.5 mg total) by mouth daily for 7 days. 03/30/18 04/06/18 Yes Pyreddy, Vivien Rota, MD  rivaroxaban (XARELTO) 20 MG TABS tablet Take 20 mg by mouth daily with supper.    Yes [provider]  SPIRIVA RESPIMAT 2.5 MCG/ACT AERS Inhale 2 puffs into the lungs daily. 01/05/18  Yes [provider]  spironolactone (ALDACTONE) 50 MG tablet Take 75 mg by mouth daily.   Yes [provider]  torsemide (DEMADEX) 20 MG tablet Take 20 mg by mouth daily.   Yes [provider]  ULORIC 80 MG TABS Take 80 mg by mouth daily. 03/07/18  Yes [provider]    Review of Systems  Constitutional: Positive for fatigue. Negative for appetite change.  HENT: Negative for congestion, rhinorrhea and sore throat.   Eyes: Negative.   Respiratory: Positive  for cough, shortness of breath and wheezing (sometimes). Negative for chest tightness.   Cardiovascular: Positive for chest pain (off and on) and palpitations. Negative for leg swelling.  Gastrointestinal: Negative for abdominal distention and abdominal pain.  Endocrine: Negative.   Genitourinary: Negative.   Musculoskeletal: Positive for arthralgias (right foot due to gout).  Skin: Negative.   Allergic/Immunologic: Negative.   Neurological: Positive for dizziness and light-headedness.  Hematological: Negative for adenopathy. Bruises/bleeds easily.  Psychiatric/Behavioral: Positive for sleep disturbance (didn't sleep well last night; wearing trilogy equilavent). Negative for dysphoric mood. The patient is not nervous/anxious.    Vitals:   04/05/18 1501  BP: 104/60  Pulse: 86  Resp: 18  SpO2: 94%  Weight: 222 lb 4 oz (100.8 kg)  Height: 5' (1.524 m)   Wt Readings from Last 3 Encounters:  04/05/18 222 lb 4 oz (  100.8 kg)  03/30/18 220 lb 6.4 oz (100 kg)  10/23/17 219 lb (99.3 kg)   Lab Results  Component Value Date   CREATININE 0.87 03/30/2018   CREATININE 0.94 03/28/2018   CREATININE 1.00 10/23/2017    Physical Exam  Constitutional: She is oriented to person, place, and time. She appears well-developed and well-nourished.  HENT:  Head: Normocephalic and atraumatic.  Neck: Normal range of motion. Neck supple. No JVD present.  Cardiovascular: Normal rate and regular rhythm.  Pulmonary/Chest: Effort normal. No respiratory distress. She has no wheezes. She has no rales.  Abdominal: Soft. She exhibits no distension.  Musculoskeletal:       Right lower leg: She exhibits no tenderness and no edema.       Left lower leg: She exhibits no tenderness and no edema.  Neurological: She is alert and oriented to person, place, and time.  Skin: Skin is warm and dry.  Psychiatric: She has a normal mood and affect. Her behavior is normal.  Nursing note and vitals reviewed.  Assessment &  Plan:  1: Chronic heart failure with reduced ejection fraction- - NYHA class II - euvolemic today - weighing daily and she was reminded to call for an overnight weight gain of >2 pounds or a weekly weight gain of >5 pounds - she is not adding salt to her food and tries to read food labels. Reviewed the importance of closely following a 2000mg  sodium diet and written information was given to her about this - discussed using entresto if BP allows; she says that her BP usually runs higher than it is today - discussed using beta-blocker but will defer this to her pulmonologist due to her severe COPD - saw cardiology Wilford Grist) 02/08/18 - BNP 03/28/18 was 77.0  2: HTN- - BP on the low side - saw PCP Thedore Mins) 03/31/18 - BMP 03/30/18 reviewed and showed sodium 141, potassium 3.7, creatinine 0.87 and GFR >60  3: COPD- - saw pulmonology ([facility]) 02/15/18 - PFT done June 2019 - patient to talk with pulmonologist about beta-blocker use  4: Chronic atrial fibrillation- - pacemaker placed 06/16/17 - cardioversion done 07/20/17 - ablation done May 2019  Patient did not bring her medications nor a list. Each medication was verbally reviewed with the patient and she was encouraged to bring the bottles to every visit to confirm accuracy of list.  Return in 1 month or sooner for any questions/problems before then.

## 2018-04-05 NOTE — Telephone Encounter (Signed)
Flagged on EMMI report for having questions about discharge papers.  Called and spoke with patient.  She mentioned the automated call had issues understanding her answer for this question and that she doesn't have any questions concerning her discharge papers at this time.  Reports doing well and aware of her follow up appointment with [facility] later this afternoon.  I thanked her for her feedback concerning the automated call.

## 2018-04-05 NOTE — Patient Instructions (Addendum)
Continue weighing daily and call for an overnight weight gain of > 2 pounds or a weekly weight gain of >5 pounds.  Ask pulmonologist about the use of a beta-blocker.

## 2018-04-06 ENCOUNTER — Encounter: Payer: Self-pay | Admitting: Family

## 2018-04-06 DIAGNOSIS — I5042 Chronic combined systolic (congestive) and diastolic (congestive) heart failure: Secondary | ICD-10-CM | POA: Insufficient documentation

## 2018-04-06 DIAGNOSIS — I4891 Unspecified atrial fibrillation: Secondary | ICD-10-CM | POA: Insufficient documentation

## 2018-04-06 DIAGNOSIS — I5022 Chronic systolic (congestive) heart failure: Secondary | ICD-10-CM | POA: Insufficient documentation

## 2018-04-06 DIAGNOSIS — I10 Essential (primary) hypertension: Secondary | ICD-10-CM | POA: Insufficient documentation

## 2018-04-06 DIAGNOSIS — J449 Chronic obstructive pulmonary disease, unspecified: Secondary | ICD-10-CM | POA: Insufficient documentation

## 2018-04-26 ENCOUNTER — Ambulatory Visit: Payer: Medicare Other | Attending: Family | Admitting: Family

## 2018-04-26 ENCOUNTER — Encounter: Payer: Self-pay | Admitting: Family

## 2018-04-26 VITALS — BP 124/78 | HR 95 | Resp 18 | Ht 60.0 in | Wt 222.1 lb

## 2018-04-26 DIAGNOSIS — Z6841 Body Mass Index (BMI) 40.0 and over, adult: Secondary | ICD-10-CM | POA: Insufficient documentation

## 2018-04-26 DIAGNOSIS — Z950 Presence of cardiac pacemaker: Secondary | ICD-10-CM | POA: Diagnosis not present

## 2018-04-26 DIAGNOSIS — I10 Essential (primary) hypertension: Secondary | ICD-10-CM

## 2018-04-26 DIAGNOSIS — I5022 Chronic systolic (congestive) heart failure: Secondary | ICD-10-CM

## 2018-04-26 DIAGNOSIS — I130 Hypertensive heart and chronic kidney disease with heart failure and stage 1 through stage 4 chronic kidney disease, or unspecified chronic kidney disease: Secondary | ICD-10-CM | POA: Diagnosis present

## 2018-04-26 DIAGNOSIS — I482 Chronic atrial fibrillation, unspecified: Secondary | ICD-10-CM | POA: Insufficient documentation

## 2018-04-26 DIAGNOSIS — E669 Obesity, unspecified: Secondary | ICD-10-CM | POA: Diagnosis not present

## 2018-04-26 DIAGNOSIS — M109 Gout, unspecified: Secondary | ICD-10-CM | POA: Insufficient documentation

## 2018-04-26 DIAGNOSIS — Z880 Allergy status to penicillin: Secondary | ICD-10-CM | POA: Insufficient documentation

## 2018-04-26 DIAGNOSIS — Z79899 Other long term (current) drug therapy: Secondary | ICD-10-CM | POA: Insufficient documentation

## 2018-04-26 DIAGNOSIS — J410 Simple chronic bronchitis: Secondary | ICD-10-CM

## 2018-04-26 DIAGNOSIS — G479 Sleep disorder, unspecified: Secondary | ICD-10-CM | POA: Diagnosis not present

## 2018-04-26 DIAGNOSIS — N189 Chronic kidney disease, unspecified: Secondary | ICD-10-CM | POA: Diagnosis not present

## 2018-04-26 DIAGNOSIS — J45909 Unspecified asthma, uncomplicated: Secondary | ICD-10-CM | POA: Diagnosis not present

## 2018-04-26 DIAGNOSIS — J449 Chronic obstructive pulmonary disease, unspecified: Secondary | ICD-10-CM | POA: Insufficient documentation

## 2018-04-26 DIAGNOSIS — Z888 Allergy status to other drugs, medicaments and biological substances status: Secondary | ICD-10-CM | POA: Insufficient documentation

## 2018-04-26 DIAGNOSIS — I4821 Permanent atrial fibrillation: Secondary | ICD-10-CM

## 2018-04-26 DIAGNOSIS — Z885 Allergy status to narcotic agent status: Secondary | ICD-10-CM | POA: Insufficient documentation

## 2018-04-26 DIAGNOSIS — Z87891 Personal history of nicotine dependence: Secondary | ICD-10-CM | POA: Insufficient documentation

## 2018-04-26 DIAGNOSIS — Z881 Allergy status to other antibiotic agents status: Secondary | ICD-10-CM | POA: Diagnosis not present

## 2018-04-26 DIAGNOSIS — Z7901 Long term (current) use of anticoagulants: Secondary | ICD-10-CM | POA: Diagnosis not present

## 2018-04-26 NOTE — Progress Notes (Signed)
Patient ID: SHAQUAYA WUELLNER, female    DOB: 05/10/1946, 72 y.o.   MRN: [id number]  HPI  Ms Radich is a 72 y/o female with a history of HTN, CKD, atrial fibrillation, asthma, COPD, BOOP, former tobacco use and chronic heart failure.   Echo report from 10/22/17 reviewed and showed an EF of 40% along with mild TR.   RHC/LHC done 02/01/17 showed cardiac index of 2.0 and pulmonary wedge pressure of 10.  Admitted 03/28/18 due to COPD exacerbation along with mild HF exacerbation. Given IV lasix along with IV solu-medrol. Transitioned to oral therapy and discharged after 2 days.   She presents today for a follow-up visit with a chief complaint of minimal shortness of breath upon moderate exertion. She describes this as chronic in nature having been present for several years. She has associated fatigue, cough, occasional chest pain, abdominal distention, dizziness, easy bruising and difficulty sleeping along with this. She denies any palpitations, pedal edema or weight gain.   Past Medical History:  Diagnosis Date  . Asthma   . Atypical atrial flutter (HCC)   . 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HCC)   . CHF (congestive heart failure) (HCC)   . Chronic renal insufficiency   . 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 COPD (chronic obstructive pulmonary disease) (HCC)   . Gout   . Hypertension   . Longstanding persistent atrial fibrillation   . Nonischemic cardiomyopathy (HCC)   . Obesity   . Pacemaker   . Spontaneous pneumothorax 2013   Past Surgical History:  Procedure Laterality Date  . ABDOMINAL HYSTERECTOMY    . ATRIAL FIBRILLATION ABLATION  07/20/2013   by Dr Christin Fudge  . AV nodal ablation  11/01/2013   by Dr Christin Fudge, repeated by Dr Wilford Grist  . BREAST BIOPSY Bilateral 1997   negative  . CARDIAC CATHETERIZATION    . CHOLECYSTECTOMY    . HERNIA REPAIR    . PACEMAKER INSERTION  06/2017   MDT Viva CRT-P implanted by Dr Christin Fudge after AV nodal ablation,   LV lead could not be placed   Family History  Problem Relation Age of Onset  . Breast cancer Mother 29       3 different times  . Breast cancer Cousin    Social History   Tobacco Use  . Smoking status: Former Games developer  . Smokeless tobacco: Never Used  Substance Use Topics  . Alcohol use: No    Alcohol/week: 0.0 standard drinks   Allergies  Allergen Reactions  . Allopurinol Other (See Comments)    Reaction:  Dizziness   . Clindamycin Anaphylaxis and Hives  . Pneumococcal 13-Val Conj Vacc Itching, Swelling and Rash  . Dronedarone Rash  . Meperidine Nausea And Vomiting  . Penicillins Hives and Other (See Comments)    Has patient had a PCN reaction causing immediate rash, facial/tongue/throat swelling, SOB or lightheadedness with hypotension: No Has patient had a PCN reaction causing severe rash involving mucus membranes or skin necrosis: No Has patient had a PCN reaction that required hospitalization No Has patient had a PCN reaction occurring within the last 10 years: No If all of the above answers are "NO", then may proceed with Cephalosporin use.  . Rosuvastatin Other (See Comments)    Reaction:  Muscle spasms   . Tetracycline Hives  . Adhesive [Tape] Rash  . Lovastatin Rash    Other reaction(s): Muscle Pain   Prior to Admission medications   Medication Sig Start  Date End Date Taking? Authorizing Provider  albuterol (PROVENTIL HFA;VENTOLIN HFA) 108 (90 Base) MCG/ACT inhaler Inhale 2 puffs into the lungs every 4 (four) hours as needed for wheezing or shortness of breath.   Yes [provider]  albuterol (PROVENTIL) (2.5 MG/3ML) 0.083% nebulizer solution Take 3 mLs (2.5 mg total) by nebulization every 6 (six) hours as needed for wheezing or shortness of breath. 09/23/15  Yes Gouru, Deanna Artis, MD  azithromycin (ZITHROMAX) 250 MG tablet Take 250 mg by mouth daily.   Yes [provider]  bisacodyl (DULCOLAX) 5 MG EC tablet Take 1 tablet (5 mg total) by mouth  daily. Patient taking differently: Take 5 mg by mouth daily as needed for moderate constipation.  10/23/17 10/23/18 Yes Merrily Brittle, MD  budesonide-formoterol [facility]) 160-4.5 MCG/ACT inhaler Inhale 2 puffs into the lungs 2 (two) times daily.   Yes [provider]  cholecalciferol (VITAMIN D) 1000 units tablet Take 2,000 Units by mouth at bedtime.   Yes [provider]  colchicine 0.6 MG tablet Take 0.6-1.2 mg by mouth daily as needed (for gout flares).   Yes [provider]  diclofenac sodium (VOLTAREN) 1 % GEL Apply 2 g topically 4 (four) times daily as needed (pain).   Yes [provider]  docusate sodium (COLACE) 100 MG capsule Take 100 mg by mouth 2 (two) times daily.   Yes [provider]  fluticasone (FLONASE) 50 MCG/ACT nasal spray Place 1 spray into both nostrils daily.    Yes [provider]  levothyroxine (SYNTHROID, LEVOTHROID) 75 MCG tablet Take 75 mcg by mouth daily before breakfast.    Yes [provider]  montelukast (SINGULAIR) 10 MG tablet Take 10 mg by mouth at bedtime.   Yes [provider]  predniSONE (DELTASONE) 5 MG tablet Take 5 mg by mouth daily with breakfast.   Yes [provider]  rivaroxaban (XARELTO) 20 MG TABS tablet Take 20 mg by mouth daily with supper.    Yes [provider]  SPIRIVA RESPIMAT 2.5 MCG/ACT AERS Inhale 2 puffs into the lungs daily. 01/05/18  Yes [provider]  spironolactone (ALDACTONE) 50 MG tablet Take 75 mg by mouth daily.   Yes [provider]  torsemide (DEMADEX) 20 MG tablet Take 20 mg by mouth daily.   Yes [provider]  ULORIC 80 MG TABS Take 80 mg by mouth daily. 03/07/18  Yes [provider]   Review of Systems  Constitutional: Positive for fatigue. Negative for appetite change.  HENT: Negative for congestion, postnasal drip and sore throat.   Eyes: Negative.   Respiratory: Positive for cough (worse w/  weather) and shortness of breath (improving). Negative for chest tightness.   Cardiovascular: Positive for chest pain (on occasion ). Negative for palpitations and leg swelling.  Gastrointestinal: Positive for abdominal distention. Negative for abdominal pain.  Endocrine: Negative.   Genitourinary: Negative.   Musculoskeletal: Positive for arthralgias (right foot due to gout).  Skin: Negative.   Allergic/Immunologic: Negative.   Neurological: Positive for dizziness and light-headedness.  Hematological: Negative for adenopathy. Bruises/bleeds easily.  Psychiatric/Behavioral: Positive for sleep disturbance (didn't sleep well last night; wearing trilogy equilavent). Negative for dysphoric mood. The patient is not nervous/anxious.    Vitals:   04/26/18 1245  BP: 124/78  Pulse: 95  Resp: 18  SpO2: 95%  Weight: 222 lb 2 oz (100.8 kg)  Height: 5' (1.524 m)   Wt Readings from Last 3 Encounters:  04/26/18 222 lb 2 oz (100.8  kg)  04/05/18 222 lb 4 oz (100.8 kg)  03/30/18 220 lb 6.4 oz (100 kg)   Lab Results  Component Value Date   CREATININE 0.87 03/30/2018   CREATININE 0.94 03/28/2018   CREATININE 1.00 10/23/2017   Physical Exam  Constitutional: She is oriented to person, place, and time. She appears well-developed and well-nourished.  HENT:  Head: Normocephalic and atraumatic.  Neck: Normal range of motion. Neck supple. No JVD present.  Cardiovascular: Normal rate and regular rhythm.  Pulmonary/Chest: Effort normal. No respiratory distress. She has no wheezes. She has no rales.  Abdominal: Soft. She exhibits no distension.  Musculoskeletal:       Right lower leg: She exhibits no tenderness and no edema.       Left lower leg: She exhibits no tenderness and no edema.  Neurological: She is alert and oriented to person, place, and time.  Skin: Skin is warm and dry.  Psychiatric: She has a normal mood and affect. Her behavior is normal.  Nursing note and vitals  reviewed.  Assessment & Plan:  1: Chronic heart failure with reduced ejection fraction- - NYHA class II - euvolemic today - weighing daily and she was reminded to call for an overnight weight gain of >2 pounds or a weekly weight gain of >5 pounds - weight unchanged from last visit here 1 month ago - she is not adding salt to her food and tries to read food labels. Reviewed the importance of closely following a 2000mg  sodium diet  - discussed using entresto and/ or metoprolol succinate. She says that she's discussed this with her pulmonologist who was going to speak with her cardiologist about the medications - advised her to let us know what is decided - saw cardiology Wilford Grist) 02/08/18 - BNP 03/28/18 was 77.0 - says that she's received her flu vaccine for this season  2: HTN- - BP looks good today - saw PCP Thedore Mins) 03/31/18 - BMP 03/30/18 reviewed and showed sodium 141, potassium 3.7, creatinine 0.87 and GFR >60  3: COPD- - saw pulmonology Jerolyn Center) 04/11/18 - PFT done June 2019 - 6 minute walk test completed 04/11/18  4: Chronic atrial fibrillation- - pacemaker placed 06/16/17 - cardioversion done 07/20/17 - ablation done May 2019  Patient did not bring her medications nor a list. Each medication was verbally reviewed with the patient and she was encouraged to bring the bottles to every visit to confirm accuracy of list.  Return in 3 months or sooner for any questions/problems before then.

## 2018-04-26 NOTE — Patient Instructions (Signed)
Continue weighing daily and call for an overnight weight gain of > 2 pounds or a weekly weight gain of >5 pounds. 

## 2018-04-27 ENCOUNTER — Encounter: Payer: Self-pay | Admitting: Family

## 2018-05-04 ENCOUNTER — Telehealth: Payer: Self-pay | Admitting: Family

## 2018-05-04 MED ORDER — SACUBITRIL-VALSARTAN 24-26 MG PO TABS: 1.0000 | ORAL_TABLET | Freq: Two times a day (BID) | ORAL | 3 refills | Status: DC

## 2018-05-04 NOTE — Telephone Encounter (Signed)
Spoke with patient regarding entresto use. She says that she spoke with cardiology who thought she could try entresto but not to use a beta-blocker due to her COPD.   Explained the use of entresto and that it can sometimes cause a lowering of her BP. She will check her BP at home prior to beginning the medication and then daily or every other day after wards, write the BP down and call me if the BP starts to drop. May need to try using her torsemide PRN at that point.   Advised patient that she needed to take entresto as 1 tablet twice daily. Will need BMP within a month to evaluate potassium (has been low but is now on spironolactone and potassium) and renal function. She sees her cardiologist 05/18/18 and will have her follow-up with [place] 06/06/18.   Patient verbalized understanding.

## 2018-05-10 ENCOUNTER — Telehealth: Payer: Self-pay | Admitting: Family

## 2018-05-10 NOTE — Telephone Encounter (Signed)
Patient called to say that she started her entresto yesterday and took 1 tablet twice daily. Today she woke up and her BP was 85/67 and she feels unbalanced.   Advised her to not take anymore entresto today. When her BP rises to SBP > 110, she can try taking the entresto again. If her BP drops again after resumption of medication, will stop it completely and add it as an allergy.  Patient was comfortable with this plan and will keep [place] posted about her BP.

## 2018-06-06 ENCOUNTER — Ambulatory Visit: Payer: Medicare Other | Attending: Family | Admitting: Family

## 2018-06-06 ENCOUNTER — Encounter: Payer: Self-pay | Admitting: Family

## 2018-06-06 VITALS — BP 122/80 | HR 96 | Resp 18 | Ht 60.0 in | Wt 217.2 lb

## 2018-06-06 DIAGNOSIS — Z888 Allergy status to other drugs, medicaments and biological substances status: Secondary | ICD-10-CM | POA: Diagnosis not present

## 2018-06-06 DIAGNOSIS — I428 Other cardiomyopathies: Secondary | ICD-10-CM | POA: Insufficient documentation

## 2018-06-06 DIAGNOSIS — Z79899 Other long term (current) drug therapy: Secondary | ICD-10-CM | POA: Diagnosis not present

## 2018-06-06 DIAGNOSIS — N189 Chronic kidney disease, unspecified: Secondary | ICD-10-CM | POA: Insufficient documentation

## 2018-06-06 DIAGNOSIS — Z7901 Long term (current) use of anticoagulants: Secondary | ICD-10-CM | POA: Diagnosis not present

## 2018-06-06 DIAGNOSIS — I509 Heart failure, unspecified: Secondary | ICD-10-CM | POA: Diagnosis present

## 2018-06-06 DIAGNOSIS — J410 Simple chronic bronchitis: Secondary | ICD-10-CM

## 2018-06-06 DIAGNOSIS — Z881 Allergy status to other antibiotic agents status: Secondary | ICD-10-CM | POA: Insufficient documentation

## 2018-06-06 DIAGNOSIS — Z803 Family history of malignant neoplasm of breast: Secondary | ICD-10-CM | POA: Diagnosis not present

## 2018-06-06 DIAGNOSIS — I5022 Chronic systolic (congestive) heart failure: Secondary | ICD-10-CM | POA: Diagnosis not present

## 2018-06-06 DIAGNOSIS — I10 Essential (primary) hypertension: Secondary | ICD-10-CM

## 2018-06-06 DIAGNOSIS — I130 Hypertensive heart and chronic kidney disease with heart failure and stage 1 through stage 4 chronic kidney disease, or unspecified chronic kidney disease: Secondary | ICD-10-CM | POA: Diagnosis not present

## 2018-06-06 DIAGNOSIS — Z6841 Body Mass Index (BMI) 40.0 and over, adult: Secondary | ICD-10-CM | POA: Diagnosis not present

## 2018-06-06 DIAGNOSIS — M109 Gout, unspecified: Secondary | ICD-10-CM | POA: Insufficient documentation

## 2018-06-06 DIAGNOSIS — Z880 Allergy status to penicillin: Secondary | ICD-10-CM | POA: Insufficient documentation

## 2018-06-06 DIAGNOSIS — Z950 Presence of cardiac pacemaker: Secondary | ICD-10-CM | POA: Diagnosis not present

## 2018-06-06 DIAGNOSIS — Z885 Allergy status to narcotic agent status: Secondary | ICD-10-CM | POA: Diagnosis not present

## 2018-06-06 DIAGNOSIS — I482 Chronic atrial fibrillation, unspecified: Secondary | ICD-10-CM | POA: Diagnosis not present

## 2018-06-06 DIAGNOSIS — E669 Obesity, unspecified: Secondary | ICD-10-CM | POA: Insufficient documentation

## 2018-06-06 DIAGNOSIS — I4821 Permanent atrial fibrillation: Secondary | ICD-10-CM

## 2018-06-06 DIAGNOSIS — Z87891 Personal history of nicotine dependence: Secondary | ICD-10-CM | POA: Diagnosis not present

## 2018-06-06 DIAGNOSIS — J449 Chronic obstructive pulmonary disease, unspecified: Secondary | ICD-10-CM | POA: Diagnosis not present

## 2018-06-06 DIAGNOSIS — Z7951 Long term (current) use of inhaled steroids: Secondary | ICD-10-CM | POA: Insufficient documentation

## 2018-06-06 NOTE — Patient Instructions (Signed)
Continue weighing daily and call for an overnight weight gain of > 2 pounds or a weekly weight gain of >5 pounds. 

## 2018-06-06 NOTE — Progress Notes (Signed)
[facility] - HEART FAILURE CLINIC - PHARMACIST COUNSELING NOTE   ASSESSMENT   (Brief summary of the presentation/problems identified): COPD gotten worse. Blood pressure runs low. Was started on Entresto but states she was told to hold it for now due to low blood pressure (low 80s). Was instructed to restart medication once BP was above 110, but never started it again.    Adherence assessment   Patient is using pill box as an adherence strategy.   Do you ever forget to take your medication? [] Yes (1) [x] No (0)  Do you ever skip doses due to side effects? [] Yes (1) [x] No (0)  Do you have trouble affording your medicines? [] Yes (1) [x] No (0)  Are you ever unable to pick up your medication due to transportation difficulties? [] Yes (1) [x] No (0)  Do you ever stop taking your medications because you don't believe they are helping? [] Yes (1) [x] No (0)  Total score _0______      Guideline-Directed Medical Therapy/Evidence Based Medicine   ACE/ARB/ARNI: Sherryll BurgerEntresto - currently being held due low BP.    Beta Blocker: none   Aldosterone Antagonist: spironolactone 75 mg daily  Diuretic: torsemide 20 mg daily    PLAN Beta blocker was not given due to COPD. Metoprolol succinate is a selective beta blocker and would be the beta blocker of choice in patient with COPD, if needed. Recommend to restart an low-dose ARB (losartan) if patient cannot tolerate Entresto. Patient refused to take lisinopril in the past due to husband and best friend having an issue with cough.    SUBJECTIVE   HPI: Follow up appointment. Pt states to be fatigue, possibly due to the holiday/cooking, and states she gained 6 lbs in the past week.    Past Medical History:  Diagnosis Date  . Asthma   . Atypical atrial flutter (HCC)   . 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HCC)   . CHF (congestive heart failure) (HCC)   . Chronic renal insufficiency   . 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   . COPD (chronic obstructive pulmonary disease) (HCC)   . Gout   . Hypertension   . Longstanding persistent atrial fibrillation   . Nonischemic cardiomyopathy (HCC)   . Obesity   . Pacemaker   . Spontaneous pneumothorax 2013      Current Outpatient Medications:  .  albuterol (PROVENTIL HFA;VENTOLIN HFA) 108 (90 Base) MCG/ACT inhaler, Inhale 2 puffs into the lungs every 4 (four) hours as needed for wheezing or shortness of breath., Disp: , Rfl:  .  albuterol (PROVENTIL) (2.5 MG/3ML) 0.083% nebulizer solution, Take 3 mLs (2.5 mg total) by nebulization every 6 (six) hours as needed for wheezing or shortness of breath., Disp: 60 vial, Rfl: 0 .  azithromycin (ZITHROMAX) 250 MG tablet, Take 250 mg by mouth daily., Disp: , Rfl:  .  bisacodyl (DULCOLAX) 5 MG EC tablet, Take 1 tablet (5 mg total) by mouth daily. (Patient taking differently: Take 5 mg by mouth daily as needed for moderate constipation. ), Disp: 30 tablet, Rfl: 0 .  budesonide-formoterol (SYMBICORT) 160-4.5 MCG/ACT inhaler, Inhale 2 puffs into the lungs 2 (two) times daily., Disp: , Rfl:  .  cholecalciferol (VITAMIN D) 1000 units tablet, Take 2,000 Units by mouth at bedtime., Disp: , Rfl:  .  colchicine 0.6 MG tablet, Take 0.6-1.2 mg by mouth daily as needed (for gout flares)., Disp: , Rfl:  .  diclofenac sodium (VOLTAREN) 1 % GEL, Apply 2 g topically  4 (four) times daily as needed (pain)., Disp: , Rfl:  .  docusate sodium (COLACE) 100 MG capsule, Take 100 mg by mouth 2 (two) times daily., Disp: , Rfl:  .  Ferrous Sulfate (IRON) 28 MG TABS, Take 28 mg by mouth. Take one tablet once daily, Disp: , Rfl:  .  fluticasone (FLONASE) 50 MCG/ACT nasal spray, Place 1 spray into both nostrils daily. , Disp: , Rfl:  .  levothyroxine (SYNTHROID, LEVOTHROID) 75 MCG tablet, Take 75 mcg by mouth daily before breakfast. , Disp: , Rfl: 2 .  montelukast (SINGULAIR) 10 MG tablet, Take 10 mg by mouth at bedtime., Disp: , Rfl:  .   predniSONE (DELTASONE) 5 MG tablet, Take 5 mg by mouth daily with breakfast., Disp: , Rfl:  .  rivaroxaban (XARELTO) 20 MG TABS tablet, Take 20 mg by mouth daily with supper. , Disp: , Rfl:  .  sacubitril-valsartan (ENTRESTO) 24-26 MG, Take 1 tablet by mouth 2 (two) times daily. (Patient not taking: Reported on 06/06/2018), Disp: 60 tablet, Rfl: 3 .  SPIRIVA RESPIMAT 2.5 MCG/ACT AERS, Inhale 2 puffs into the lungs daily., Disp: , Rfl: 11 .  spironolactone (ALDACTONE) 50 MG tablet, Take 75 mg by mouth daily., Disp: , Rfl:  .  torsemide (DEMADEX) 20 MG tablet, Take 20 mg by mouth daily., Disp: , Rfl:  .  ULORIC 80 MG TABS, Take 80 mg by mouth daily., Disp: , Rfl: 3    OBJECTIVE    BMP Latest Ref Rng & Units 03/30/2018 03/28/2018 10/23/2017  Glucose 70 - 99 mg/dL 003(K) 917(H) 150(V)  BUN 8 - 23 mg/dL 69(V) 18 94(I)  Creatinine 0.44 - 1.00 mg/dL 0.16 5.53 7.48  Sodium 135 - 145 mmol/L 141 142 136  Potassium 3.5 - 5.1 mmol/L 3.7 3.2(L) 3.7  Chloride 98 - 111 mmol/L 100 102 98(L)  CO2 22 - 32 mmol/L 28 26 23   Calcium 8.9 - 10.3 mg/dL 9.3 9.3 9.2    Vital signs: deferred  PE: Deferred  ECHO: Date 10/2017, EF 40   DRUGS TO AVOID IN HEART FAILURE  Drug or Class Mechanism  Analgesics . NSAIDs . COX-2 inhibitors . Glucocorticoids  Sodium and water retention, increased systemic vascular resistance, decreased response to diuretics   Diabetes Medications . Metformin . Thiazolidinediones o Rosiglitazone (Avandia) o Pioglitazone (Actos) . DPP4 Inhibitors o Saxagliptin (Onglyza) o Sitagliptin (Januvia)   Lactic acidosis Possible calcium channel blockade   Unknown  Antiarrhythmics . Class I  o Flecainide o Disopyramide . Class III o Sotalol . Other o Dronedarone  Negative inotrope, proarrhythmic   Proarrhythmic, beta blockade  Negative inotrope  Antihypertensives . Alpha Blockers o Doxazosin . Calcium Channel Blockers o Diltiazem o Verapamil o Nifedipine . Central  Alpha Adrenergics o Moxonidine . Peripheral Vasodilators o Minoxidil  Increases renin and aldosterone  Negative inotrope    Possible sympathetic withdrawal  Unknown  Anti-infective . Itraconazole . Amphotericin B  Negative inotrope Unknown  Hematologic . Anagrelide . Cilostazol   Possible inhibition of PD IV Inhibition of PD III causing arrhythmias  Neurologic/Psychiatric . Stimulants . Anti-Seizure Drugs o Carbamazepine o Pregabalin . Antidepressants o Tricyclics o Citalopram . Parkinsons o Bromocriptine o Pergolide o Pramipexole . Antipsychotics o Clozapine . Antimigraine o Ergotamine o Methysergide . Appetite suppressants . Bipolar o Lithium  Peripheral alpha and beta agonist activity  Negative inotrope and chronotrope Calcium channel blockade  Negative inotrope, proarrhythmic Dose-dependent QT prolongation  Excessive serotonin activity/valvular damage Excessive serotonin activity/valvular damage Unknown  IgE mediated hypersensitivy, calcium channel blockade  Excessive serotonin activity/valvular damage Excessive serotonin activity/valvular damage Valvular damage  Direct myofibrillar degeneration, adrenergic stimulation  Antimalarials . Chloroquine . Hydroxychloroquine Intracellular inhibition of lysosomal enzymes  Urologic Agents . Alpha Blockers o Doxazosin o Prazosin o Tamsulosin o Terazosin  Increased renin and aldosterone  Adapted from Page RL, et al. "Drugs That May Cause or Exacerbate Heart Failure: A Scientific Statement from the American Heart  Association." Circulation 2016; 134:e32-e69. DOI: 10.1161/CIR.0000000000000426   COUNSELING POINTS/CLINICAL PEARLS ( DELETE ANY DRUGS NOT ON PATIENTS MEDICATION LIST) Entresto (Goal: 97/103 mg twice daily)  Warn female patient to avoid pregnancy during therapy and to report a pregnancy to a  physician.  Advise patient to report symptomatic hypotension.  Side effects may include  hyperkalemia, cough, dizziness, or renal failure.  Torsemide  Side effects may include excessive urination.  Tell patient to report symptoms of ototoxicity.  Instruct patient to report lightheadedness or syncope.  Warn patient to avoid use of nonprescription NSAID products without first discussing it with  their healthcare provider. Spironolactone  Warn patient to report dehydration, hypotension, or symptoms of worsening renal function.  Counsel female patient to report gynecomastia.  Side effects may include diarrhea, nausea, vomiting, abdominal cramping, fever, leg  cramps, lethargy, mental confusion, decreased libido, irregular menses, and rash. Suspension: Tell patient to take drug consistently with respect to food, either before or after  a meal.  Advise patient to avoid potassium supplements and foods containing high levels of  potassium, including salt substitutes.  MEDICATION ADHERENCES TIPS AND STRATEGIES 1. Taking medication as prescribed improves patient outcomes in heart failure (reduces hospitalizations, improves symptoms, increases survival) 2. Side effects of medications can be managed by decreasing doses, switching agents, stopping drugs, or adding additional therapy. Please let someone in the Heart Failure Clinic know if you have having bothersome side effects so we can modify your regimen. Do not alter your medication regimen without talking to [place].  3. Medication reminders can help patients remember to take drugs on time. If you are missing or forgetting doses you can try linking behaviors, using pill boxes, or an electronic reminder like an alarm on your phone or an app. Some people can also get automated phone calls as medication reminders.   Time spent: 20 minutes  Ronnald Ramp, Pharm.D, BCPS Clinical Pharmacist 06/06/18

## 2018-06-06 NOTE — Progress Notes (Signed)
Patient ID: Kathleen [name], female    DOB: [birth year], 72 y.o.   MRN: [id number]  HPI  [name] is a 72 y/o female with a history of HTN, CKD, atrial fibrillation, asthma, COPD, BOOP, former tobacco use and chronic heart failure.   Echo report from 10/22/17 reviewed and showed an EF of 40% along with mild TR.   RHC/LHC done 02/01/17 showed cardiac index of 2.0 and pulmonary wedge pressure of 10.  Admitted 03/28/18 due to COPD exacerbation along with mild HF exacerbation. Given IV lasix along with IV solu-medrol. Transitioned to oral therapy and discharged after 2 days.   She presents today for a follow-up visit with a chief complaint of minimal shortness of breath upon moderate exertion. She describes this as chronic in nature having been present for several years. She has associated fatigue, dizziness, cough, chest pain, abdominal distention and difficulty sleeping along with this. She denies any palpitations, pedal edema or weight gain. Entresto 24/26mg  was started at her last visit but she was unable to tolerate it due to hypotension and it was subsequently stopped.   Past Medical History:  Diagnosis Date  . Asthma   . Atypical atrial flutter (HCC)   . 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HCC)   . CHF (congestive heart failure) (HCC)   . Chronic renal insufficiency   . 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 COPD (chronic obstructive pulmonary disease) (HCC)   . Gout   . Hypertension   . Longstanding persistent atrial fibrillation   . Nonischemic cardiomyopathy (HCC)   . Obesity   . Pacemaker   . Spontaneous pneumothorax 2013   Past Surgical History:  Procedure Laterality Date  . ABDOMINAL HYSTERECTOMY    . ATRIAL FIBRILLATION ABLATION  07/20/2013   by Dr Christin FudgeHegland  . AV nodal ablation  11/01/2013   by Dr Christin FudgeHegland, repeated by Dr Wilford GristKoontz  . BREAST BIOPSY Bilateral 1997   negative  . CARDIAC CATHETERIZATION    . CHOLECYSTECTOMY    . HERNIA  REPAIR    . PACEMAKER INSERTION  06/2017   MDT Viva CRT-P implanted by Dr Christin FudgeHegland after AV nodal ablation,  LV lead could not be placed   Family History  Problem Relation Age of Onset  . Breast cancer Mother 3742       3 different times  . Breast cancer Cousin    Social History   Tobacco Use  . Smoking status: Former Games developermoker  . Smokeless tobacco: Never Used  Substance Use Topics  . Alcohol use: No    Alcohol/week: 0.0 standard drinks   Allergies  Allergen Reactions  . Allopurinol Other (See Comments)    Reaction:  Dizziness   . Clindamycin Anaphylaxis and Hives  . Pneumococcal 13-Val Conj Vacc Itching, Swelling and Rash  . Dronedarone Rash  . Meperidine Nausea And Vomiting  . Penicillins Hives and Other (See Comments)    Has patient had a PCN reaction causing immediate rash, facial/tongue/throat swelling, SOB or lightheadedness with hypotension: No Has patient had a PCN reaction causing severe rash involving mucus membranes or skin necrosis: No Has patient had a PCN reaction that required hospitalization No Has patient had a PCN reaction occurring within the last 10 years: No If all of the above answers are "NO", then may proceed with Cephalosporin use.  . Rosuvastatin Other (See Comments)    Reaction:  Muscle spasms   . Tetracycline Hives  . Adhesive [Tape] Rash  .  Lovastatin Rash    Other reaction(s): Muscle Pain   Prior to Admission medications   Medication Sig Start Date End Date Taking? Authorizing Provider  albuterol (PROVENTIL HFA;VENTOLIN HFA) 108 (90 Base) MCG/ACT inhaler Inhale 2 puffs into the lungs every 4 (four) hours as needed for wheezing or shortness of breath.   Yes [provider]  albuterol (PROVENTIL) (2.5 MG/3ML) 0.083% nebulizer solution Take 3 mLs (2.5 mg total) by nebulization every 6 (six) hours as needed for wheezing or shortness of breath. 09/23/15  Yes Gouru, Deanna Artis, MD  azithromycin (ZITHROMAX) 250 MG tablet Take 250 mg by mouth daily.    Yes [provider]  bisacodyl (DULCOLAX) 5 MG EC tablet Take 1 tablet (5 mg total) by mouth daily. Patient taking differently: Take 5 mg by mouth daily as needed for moderate constipation.  10/23/17 10/23/18 Yes Merrily Brittle, MD  budesonide-formoterol [facility]) 160-4.5 MCG/ACT inhaler Inhale 2 puffs into the lungs 2 (two) times daily.   Yes [provider]  cholecalciferol (VITAMIN D) 1000 units tablet Take 2,000 Units by mouth at bedtime.   Yes [provider]  colchicine 0.6 MG tablet Take 0.6-1.2 mg by mouth daily as needed (for gout flares).   Yes [provider]  diclofenac sodium (VOLTAREN) 1 % GEL Apply 2 g topically 4 (four) times daily as needed (pain).   Yes [provider]  docusate sodium (COLACE) 100 MG capsule Take 100 mg by mouth 2 (two) times daily.   Yes [provider]  Ferrous Sulfate (IRON) 28 MG TABS Take 28 mg by mouth. Take one tablet once daily   Yes [provider]  fluticasone (FLONASE) 50 MCG/ACT nasal spray Place 1 spray into both nostrils daily.    Yes [provider]  levothyroxine (SYNTHROID, LEVOTHROID) 75 MCG tablet Take 75 mcg by mouth daily before breakfast.    Yes [provider]  predniSONE (DELTASONE) 5 MG tablet Take 5 mg by mouth daily with breakfast.   Yes [provider]  rivaroxaban (XARELTO) 20 MG TABS tablet Take 20 mg by mouth daily with supper.    Yes [provider]  SPIRIVA RESPIMAT 2.5 MCG/ACT AERS Inhale 2 puffs into the lungs daily. 01/05/18  Yes [provider]  spironolactone (ALDACTONE) 50 MG tablet Take 75 mg by mouth daily.   Yes [provider]  torsemide (DEMADEX) 20 MG tablet Take 20 mg by mouth daily.   Yes [provider]  ULORIC 80 MG TABS Take 80 mg by mouth daily. 03/07/18  Yes [provider]  montelukast (SINGULAIR) 10 MG tablet Take 10 mg by mouth at bedtime.    [provider]     Review of Systems  Constitutional: Positive for fatigue. Negative for appetite change.  HENT: Negative for congestion, postnasal drip and sore throat.   Eyes: Negative.   Respiratory: Positive for cough (worse w/ weather) and shortness of breath (improving). Negative for chest tightness.   Cardiovascular: Positive for chest pain (on occasion ). Negative for palpitations and leg swelling.  Gastrointestinal: Positive for abdominal distention. Negative for abdominal pain.  Endocrine: Negative.   Genitourinary: Negative.   Musculoskeletal: Positive for arthralgias (right foot due to gout).  Skin: Negative.   Allergic/Immunologic: Negative.   Neurological: Positive for dizziness and light-headedness.  Hematological: Negative for adenopathy. Bruises/bleeds easily.  Psychiatric/Behavioral: Positive for sleep disturbance (didn't sleep well last night; wearing trilogy equilavent). Negative for dysphoric mood. The patient is not nervous/anxious.    Vitals:  06/06/18 1009  BP: 122/80  Pulse: 96  Resp: 18  SpO2: 94%  Weight: 217 lb 4 oz (98.5 kg)  Height: 5' (1.524 m)   Wt Readings from Last 3 Encounters:  06/06/18 217 lb 4 oz (98.5 kg)  04/26/18 222 lb 2 oz (100.8 kg)  04/05/18 222 lb 4 oz (100.8 kg)   Lab Results  Component Value Date   CREATININE 0.87 03/30/2018   CREATININE 0.94 03/28/2018   CREATININE 1.00 10/23/2017    Physical Exam Vitals signs and nursing note reviewed.  Constitutional:      Appearance: She is well-developed.  HENT:     Head: Normocephalic and atraumatic.  Neck:     Musculoskeletal: Normal range of motion and neck supple.     Vascular: No JVD.  Cardiovascular:     Rate and Rhythm: Normal rate and regular rhythm.  Pulmonary:     Effort: Pulmonary effort is normal. No respiratory distress.     Breath sounds: No wheezing or rales.  Abdominal:     General: There is no distension.     Palpations: Abdomen is soft.  Musculoskeletal:     Right lower  leg: She exhibits no tenderness. No edema.     Left lower leg: She exhibits no tenderness. No edema.  Skin:    General: Skin is warm and dry.  Neurological:     Mental Status: She is alert and oriented to person, place, and time.  Psychiatric:        Behavior: Behavior normal.    Assessment & Plan:  1: Chronic heart failure with reduced ejection fraction- - NYHA class II - euvolemic today - weighing daily and she was reminded to call for an overnight weight gain of >2 pounds or a weekly weight gain of >5 pounds - weight down 5 pounds from last visit 6 weeks ago - she is not adding salt to her food and tries to read food labels. Reviewed the importance of closely following a 2000mg  sodium diet  - she was unable to tolerate entresto due to hypotension; will add this to her allergy list - saw cardiology Wilford Grist) 02/08/18 - BNP 03/28/18 was 77.0 - says that she's received her flu vaccine for this season - PharmD reconciled medications with the patient - may benefit from cardiac rehab  2: HTN- - BP looks good today - saw PCP Thedore Mins) 03/31/18 - BMP 04/21/18 reviewed and showed sodium 141, potassium 3.2, creatinine 0.8 and GFR 71  3: COPD- - saw pulmonology [facility]) 04/11/18 - PFT done June 2019 - 6 minute walk test completed 04/11/18 - concern regarding using a beta-blocker with the risk of bronchospasm; will defer to her cardiologist in this regard  4: Chronic atrial fibrillation- - pacemaker placed 06/16/17 - cardioversion done 07/20/17 - ablation done May 2019 - device interrogated 05/18/18  Patient did not bring her medications nor a list. Each medication was verbally reviewed with the patient and she was encouraged to bring the bottles to every visit to confirm accuracy of list.  Return in 2 months or sooner for any questions/problems before then.

## 2018-06-07 ENCOUNTER — Encounter: Payer: Self-pay | Admitting: Family

## 2018-07-26 ENCOUNTER — Other Ambulatory Visit: Payer: Self-pay

## 2018-07-26 ENCOUNTER — Encounter: Payer: Self-pay | Admitting: Family

## 2018-07-26 ENCOUNTER — Ambulatory Visit: Payer: Medicare Other | Attending: Family | Admitting: Family

## 2018-07-26 VITALS — BP 118/70 | HR 80 | Temp 98.7°F | Resp 18 | Ht 62.0 in | Wt 216.8 lb

## 2018-07-26 DIAGNOSIS — Z7952 Long term (current) use of systemic steroids: Secondary | ICD-10-CM | POA: Diagnosis not present

## 2018-07-26 DIAGNOSIS — Z87891 Personal history of nicotine dependence: Secondary | ICD-10-CM | POA: Insufficient documentation

## 2018-07-26 DIAGNOSIS — I428 Other cardiomyopathies: Secondary | ICD-10-CM | POA: Diagnosis not present

## 2018-07-26 DIAGNOSIS — J410 Simple chronic bronchitis: Secondary | ICD-10-CM

## 2018-07-26 DIAGNOSIS — Z888 Allergy status to other drugs, medicaments and biological substances status: Secondary | ICD-10-CM | POA: Insufficient documentation

## 2018-07-26 DIAGNOSIS — Z880 Allergy status to penicillin: Secondary | ICD-10-CM | POA: Insufficient documentation

## 2018-07-26 DIAGNOSIS — I5022 Chronic systolic (congestive) heart failure: Secondary | ICD-10-CM | POA: Diagnosis present

## 2018-07-26 DIAGNOSIS — J449 Chronic obstructive pulmonary disease, unspecified: Secondary | ICD-10-CM | POA: Diagnosis not present

## 2018-07-26 DIAGNOSIS — I4821 Permanent atrial fibrillation: Secondary | ICD-10-CM

## 2018-07-26 DIAGNOSIS — Z9049 Acquired absence of other specified parts of digestive tract: Secondary | ICD-10-CM | POA: Diagnosis not present

## 2018-07-26 DIAGNOSIS — E669 Obesity, unspecified: Secondary | ICD-10-CM | POA: Insufficient documentation

## 2018-07-26 DIAGNOSIS — Z803 Family history of malignant neoplasm of breast: Secondary | ICD-10-CM | POA: Insufficient documentation

## 2018-07-26 DIAGNOSIS — I4892 Unspecified atrial flutter: Secondary | ICD-10-CM | POA: Insufficient documentation

## 2018-07-26 DIAGNOSIS — Z6839 Body mass index (BMI) 39.0-39.9, adult: Secondary | ICD-10-CM | POA: Insufficient documentation

## 2018-07-26 DIAGNOSIS — I482 Chronic atrial fibrillation, unspecified: Secondary | ICD-10-CM | POA: Insufficient documentation

## 2018-07-26 DIAGNOSIS — M109 Gout, unspecified: Secondary | ICD-10-CM | POA: Insufficient documentation

## 2018-07-26 DIAGNOSIS — Z887 Allergy status to serum and vaccine status: Secondary | ICD-10-CM | POA: Insufficient documentation

## 2018-07-26 DIAGNOSIS — Z79890 Hormone replacement therapy (postmenopausal): Secondary | ICD-10-CM | POA: Diagnosis not present

## 2018-07-26 DIAGNOSIS — Z883 Allergy status to other anti-infective agents status: Secondary | ICD-10-CM | POA: Diagnosis not present

## 2018-07-26 DIAGNOSIS — I10 Essential (primary) hypertension: Secondary | ICD-10-CM

## 2018-07-26 DIAGNOSIS — I130 Hypertensive heart and chronic kidney disease with heart failure and stage 1 through stage 4 chronic kidney disease, or unspecified chronic kidney disease: Secondary | ICD-10-CM | POA: Diagnosis present

## 2018-07-26 DIAGNOSIS — Z950 Presence of cardiac pacemaker: Secondary | ICD-10-CM | POA: Diagnosis not present

## 2018-07-26 DIAGNOSIS — Z79899 Other long term (current) drug therapy: Secondary | ICD-10-CM | POA: Diagnosis not present

## 2018-07-26 DIAGNOSIS — Z7901 Long term (current) use of anticoagulants: Secondary | ICD-10-CM | POA: Diagnosis not present

## 2018-07-26 DIAGNOSIS — Z885 Allergy status to narcotic agent status: Secondary | ICD-10-CM | POA: Diagnosis not present

## 2018-07-26 DIAGNOSIS — N189 Chronic kidney disease, unspecified: Secondary | ICD-10-CM | POA: Insufficient documentation

## 2018-07-26 DIAGNOSIS — Z881 Allergy status to other antibiotic agents status: Secondary | ICD-10-CM | POA: Insufficient documentation

## 2018-07-26 NOTE — Patient Instructions (Signed)
Continue weighing daily and call for an overnight weight gain of > 2 pounds or a weekly weight gain of >5 pounds. 

## 2018-07-26 NOTE — Progress Notes (Signed)
Patient ID: Kathleen [name], female    DOB: 26-May-1946, 73 y.o.   MRN: [id number]  HPI  Kathleen [name] is a 73 y/o female with a history of HTN, CKD, atrial fibrillation, asthma, COPD, BOOP, former tobacco use and chronic heart failure.   Echo report from 10/22/17 reviewed and showed an EF of 40% along with mild TR.   RHC/LHC done 02/01/17 showed cardiac index of 2.0 and pulmonary wedge pressure of 10.  Admitted 03/28/18 due to COPD exacerbation along with mild HF exacerbation. Given IV lasix along with IV solu-medrol. Transitioned to oral therapy and discharged after 2 days.   She presents today for a follow-up visit with a chief complaint of moderate shortness of breath upon minimal exertion. She describes this as chronic in nature having been present for several years. She does feel like it's worsened some with the weather changes we've had. She has associated fatigue, cough, chest pain, dizziness and difficulty sleeping along with this. She denies any abdominal distention, palpitations, pedal edema or weight gain.   Past Medical History:  Diagnosis Date  . Asthma   . Atypical atrial flutter (HCC)   . 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HCC)   . CHF (congestive heart failure) (HCC)   . Chronic renal insufficiency   . 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 COPD (chronic obstructive pulmonary disease) (HCC)   . Gout   . Hypertension   . Longstanding persistent atrial fibrillation   . Nonischemic cardiomyopathy (HCC)   . Obesity   . Pacemaker   . Spontaneous pneumothorax 2013   Past Surgical History:  Procedure Laterality Date  . ABDOMINAL HYSTERECTOMY    . ATRIAL FIBRILLATION ABLATION  07/20/2013   by Dr Christin Fudge  . AV nodal ablation  11/01/2013   by Dr Christin Fudge, repeated by Dr Wilford Grist  . BREAST BIOPSY Bilateral 1997   negative  . CARDIAC CATHETERIZATION    . CHOLECYSTECTOMY    . HERNIA REPAIR    . PACEMAKER INSERTION  06/2017   MDT Viva CRT-P  implanted by Dr Christin Fudge after AV nodal ablation,  LV lead could not be placed   Family History  Problem Relation Age of Onset  . Breast cancer Mother 36       3 different times  . Breast cancer Cousin    Social History   Tobacco Use  . Smoking status: Former Games developer  . Smokeless tobacco: Never Used  Substance Use Topics  . Alcohol use: No    Alcohol/week: 0.0 standard drinks   Allergies  Allergen Reactions  . Allopurinol Other (See Comments)    Reaction:  Dizziness   . Clindamycin Anaphylaxis and Hives  . Pneumococcal 13-Val Conj Vacc Itching, Swelling and Rash  . Dronedarone Rash  . Entresto [Sacubitril-Valsartan] Other (See Comments)    hypotension  . Meperidine Nausea And Vomiting  . Penicillins Hives and Other (See Comments)    Has patient had a PCN reaction causing immediate rash, facial/tongue/throat swelling, SOB or lightheadedness with hypotension: No Has patient had a PCN reaction causing severe rash involving mucus membranes or skin necrosis: No Has patient had a PCN reaction that required hospitalization No Has patient had a PCN reaction occurring within the last 10 years: No If all of the above answers are "NO", then may proceed with Cephalosporin use.  . Rosuvastatin Other (See Comments)    Reaction:  Muscle spasms   . Tetracycline Hives  . Adhesive [Tape] Rash  .  Lovastatin Rash    Other reaction(s): Muscle Pain   Prior to Admission medications   Medication Sig Start Date End Date Taking? Authorizing Provider  albuterol (PROVENTIL HFA;VENTOLIN HFA) 108 (90 Base) MCG/ACT inhaler Inhale 2 puffs into the lungs every 4 (four) hours as needed for wheezing or shortness of breath.   Yes [provider]  albuterol (PROVENTIL) (2.5 MG/3ML) 0.083% nebulizer solution Take 3 mLs (2.5 mg total) by nebulization every 6 (six) hours as needed for wheezing or shortness of breath. 09/23/15  Yes Gouru, Deanna Artis, MD  azithromycin (ZITHROMAX) 250 MG tablet Take 250 mg by  mouth daily.   Yes [provider]  bisacodyl (DULCOLAX) 5 MG EC tablet Take 1 tablet (5 mg total) by mouth daily. Patient taking differently: Take 5 mg by mouth daily as needed for moderate constipation.  10/23/17 10/23/18 Yes Merrily Brittle, MD  budesonide-formoterol [facility]) 160-4.5 MCG/ACT inhaler Inhale 2 puffs into the lungs 2 (two) times daily.   Yes [provider]  cholecalciferol (VITAMIN D) 1000 units tablet Take 2,000 Units by mouth at bedtime.   Yes [provider]  colchicine 0.6 MG tablet Take 0.6-1.2 mg by mouth daily as needed (for gout flares).   Yes [provider]  diclofenac sodium (VOLTAREN) 1 % GEL Apply 2 g topically 4 (four) times daily as needed (pain).   Yes [provider]  docusate sodium (COLACE) 100 MG capsule Take 100 mg by mouth 2 (two) times daily.   Yes [provider]  Ferrous Sulfate (IRON) 28 MG TABS Take 28 mg by mouth. Take one tablet once daily   Yes [provider]  fluticasone (FLONASE) 50 MCG/ACT nasal spray Place 1 spray into both nostrils daily.    Yes [provider]  levothyroxine (SYNTHROID, LEVOTHROID) 75 MCG tablet Take 75 mcg by mouth daily before breakfast.    Yes [provider]  montelukast (SINGULAIR) 10 MG tablet Take 10 mg by mouth at bedtime.   Yes [provider]  predniSONE (DELTASONE) 5 MG tablet Take 5 mg by mouth daily with breakfast.   Yes [provider]  rivaroxaban (XARELTO) 20 MG TABS tablet Take 20 mg by mouth daily with supper.    Yes [provider]  SPIRIVA RESPIMAT 2.5 MCG/ACT AERS Inhale 2 puffs into the lungs daily. 01/05/18  Yes [provider]  spironolactone (ALDACTONE) 50 MG tablet Take 75 mg by mouth daily.   Yes [provider]  torsemide (DEMADEX) 20 MG tablet Take 20 mg by mouth daily.   Yes [provider]  ULORIC 80 MG TABS Take 80 mg by mouth daily. 03/07/18  Yes [provider]    Review of Systems  Constitutional: Positive for fatigue. Negative for appetite change.  HENT: Negative for congestion, postnasal drip and sore throat.   Eyes: Negative.   Respiratory: Positive for cough (worse w/ weather) and shortness of breath (with minimal exertion). Negative for chest tightness.   Cardiovascular: Positive for chest pain (on occasion ). Negative for palpitations and leg swelling.  Gastrointestinal: Negative for abdominal distention and abdominal pain.  Endocrine: Negative.   Genitourinary: Negative.   Musculoskeletal: Positive for arthralgias (right shoulder).  Skin: Negative.   Allergic/Immunologic: Negative.   Neurological: Positive for dizziness. Negative for light-headedness.  Hematological: Negative for adenopathy. Bruises/bleeds easily.  Psychiatric/Behavioral: Positive for sleep disturbance (didn't sleep well last night; wearing trilogy equilavent). Negative for dysphoric mood. The patient is not nervous/anxious.    Vitals:  07/26/18 1049 07/26/18 1111  BP: (!) 157/67 118/70  Pulse: 80   Resp: 18   Temp: 98.7 F (37.1 C)   TempSrc: Oral   SpO2: 95%   Weight: 216 lb 12.8 oz (98.3 kg)   Height: 5\' 2"  (1.575 m)    Wt Readings from Last 3 Encounters:  07/26/18 216 lb 12.8 oz (98.3 kg)  06/06/18 217 lb 4 oz (98.5 kg)  04/26/18 222 lb 2 oz (100.8 kg)    Lab Results  Component Value Date   CREATININE 0.87 03/30/2018   CREATININE 0.94 03/28/2018   CREATININE 1.00 10/23/2017    Physical Exam Vitals signs and nursing note reviewed.  Constitutional:      Appearance: She is well-developed.  HENT:     Head: Normocephalic and atraumatic.  Neck:     Musculoskeletal: Normal range of motion and neck supple.     Vascular: No JVD.  Cardiovascular:     Rate and Rhythm: Normal rate and regular rhythm.  Pulmonary:     Effort: Pulmonary effort is normal. No respiratory distress.     Breath sounds: No wheezing or rales.  Abdominal:      General: There is no distension.     Palpations: Abdomen is soft.  Musculoskeletal:     Right lower leg: She exhibits no tenderness. No edema.     Left lower leg: She exhibits no tenderness. No edema.  Skin:    General: Skin is warm and dry.  Neurological:     Mental Status: She is alert and oriented to person, place, and time.  Psychiatric:        Behavior: Behavior normal.    Assessment & Plan:  1: Chronic heart failure with reduced ejection fraction- - NYHA class III - euvolemic today - weighing daily and she was reminded to call for an overnight weight gain of >2 pounds or a weekly weight gain of >5 pounds - weight stable from last visit 2 months ago - she is not adding salt to her food and tries to read food labels. Reviewed the importance of closely following a 2000mg  sodium diet  - she was unable to tolerate entresto due to hypotension - saw cardiology Wilford Grist) 02/08/18 - BNP 03/28/18 was 77.0 - says that she's received her flu vaccine for this season - may benefit from cardiac rehab  2: HTN- - BP mildly elevated initially but much better upon recheck with a manual cuff - saw PCP Thedore Mins) 03/31/18 - BMP 05/18/18 reviewed and showed sodium 138, potassium 3.5, creatinine 0.9   3: COPD- - saw pulmonology ([facility]) 06/14/2018 - PFT done June 2019 - 6 minute walk test completed 04/11/18 - concern regarding using a beta-blocker with the risk of bronchospasm; will defer to her cardiologist in this regard  4: Chronic atrial fibrillation- - pacemaker placed 06/16/17 - cardioversion done 07/20/17 - ablation done May 2019 - device interrogated 05/18/18  Patient did not bring her medications nor a list. Each medication was verbally reviewed with the patient and she was encouraged to bring the bottles to every visit to confirm accuracy of list.  Return in 6 months or sooner for any questions/problems before then.

## 2018-11-11 ENCOUNTER — Other Ambulatory Visit: Payer: Self-pay | Admitting: Internal Medicine

## 2018-11-11 DIAGNOSIS — Z1231 Encounter for screening mammogram for malignant neoplasm of breast: Secondary | ICD-10-CM

## 2019-01-23 NOTE — Progress Notes (Signed)
Patient ID: Kathleen [name], female    DOB: [birth year], 73 y.o.   MRN: [id number]  HPI  Kathleen [name] is a 73y/o female with a history of HTN, CKD, atrial fibrillation, asthma, COPD, BOOP, former tobacco use and chronic heart failure.   Echo report from 10/22/17 reviewed and showed an EF of 40% along with mild TR.   RHC/LHC done 02/01/17 showed cardiac index of 2.0 and pulmonary wedge pressure of 10.  Has not been admitted or been in the ED in the last 6 months.    She presents today for a follow-up visit with a chief complaint of moderate shortness of breath upon minimal exertion. She describes this as chronic in nature having been present for several years. She has associated fatigue, productive cough, pedal edema, abdominal distention, dizziness, difficulty sleeping and weight gain along with this. She denies any palpitations. She has intermittently taken extra torsemide due to fluid retention but is having difficulty with gout in her right hand. She says that she's gained ~ 7 pounds in the last week but has urinated numerous times today. BP at home has been elevated for her at 147/74 & 161/89.Has recently been treated for acute bronchitis but has finished her antibiotic. Continues with productive, hacky sounding cough.   She had an episode of chest pain 2 weeks ago which lasted for 2 days without any relief. She tried taking anti-acid without any relief. CP subsequently resolved on its own. Had called cardiologist but has not heard back from them yet.   Scheduled for echo September 2020 with f/u visit with cardiologist after that.   Past Medical History:  Diagnosis Date  . Asthma   . Atypical atrial flutter (HCC)   . 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HCC)   . CHF (congestive heart failure) (HCC)   . Chronic renal insufficiency   . 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 COPD (chronic obstructive pulmonary disease) (HCC)   . Gout   . Hypertension   .  Longstanding persistent atrial fibrillation   . Nonischemic cardiomyopathy (HCC)   . Obesity   . Pacemaker   . Spontaneous pneumothorax 2013   Past Surgical History:  Procedure Laterality Date  . ABDOMINAL HYSTERECTOMY    . ATRIAL FIBRILLATION ABLATION  07/20/2013   by Dr Christin Fudge  . AV nodal ablation  11/01/2013   by Dr Christin Fudge, repeated by Dr Wilford Grist  . BREAST BIOPSY Bilateral 1997   negative  . CARDIAC CATHETERIZATION    . CHOLECYSTECTOMY    . HERNIA REPAIR    . PACEMAKER INSERTION  06/2017   MDT Viva CRT-P implanted by Dr Christin Fudge after AV nodal ablation,  LV lead could not be placed   Family History  Problem Relation Age of Onset  . Breast cancer Mother 30       3 different times  . Breast cancer Cousin    Social History   Tobacco Use  . Smoking status: Former Games developer  . Smokeless tobacco: Never Used  Substance Use Topics  . Alcohol use: No    Alcohol/week: 0.0 standard drinks   Allergies  Allergen Reactions  . Allopurinol Other (See Comments)    Reaction:  Dizziness   . Clindamycin Anaphylaxis and Hives  . Pneumococcal 13-Val Conj Vacc Itching, Swelling and Rash  . Dronedarone Rash  . Entresto [Sacubitril-Valsartan] Other (See Comments)    hypotension  . Meperidine Nausea And Vomiting  . Penicillins Hives and Other (See Comments)  Has patient had a PCN reaction causing immediate rash, facial/tongue/throat swelling, SOB or lightheadedness with hypotension: No Has patient had a PCN reaction causing severe rash involving mucus membranes or skin necrosis: No Has patient had a PCN reaction that required hospitalization No Has patient had a PCN reaction occurring within the last 10 years: No If all of the above answers are "NO", then may proceed with Cephalosporin use.  . Rosuvastatin Other (See Comments)    Reaction:  Muscle spasms   . Tetracycline Hives  . Adhesive [Tape] Rash  . Lovastatin Rash    Other reaction(s): Muscle Pain   Prior to Admission  medications   Medication Sig Start Date End Date Taking? Authorizing Provider  albuterol (PROVENTIL HFA;VENTOLIN HFA) 108 (90 Base) MCG/ACT inhaler Inhale 2 puffs into the lungs every 4 (four) hours as needed for wheezing or shortness of breath.   Yes [provider]  albuterol (PROVENTIL) (2.5 MG/3ML) 0.083% nebulizer solution Take 3 mLs (2.5 mg total) by nebulization every 6 (six) hours as needed for wheezing or shortness of breath. 09/23/15  Yes Gouru, Deanna ArtisAruna, MD  azithromycin (ZITHROMAX) 250 MG tablet Take 250 mg by mouth daily.   Yes [provider]  budesonide-formoterol (SYMBICORT) 160-4.5 MCG/ACT inhaler Inhale 2 puffs into the lungs 2 (two) times daily.   Yes [provider]  cholecalciferol (VITAMIN D) 1000 units tablet Take 2,000 Units by mouth at bedtime.   Yes [provider]  colchicine 0.6 MG tablet Take 0.6-1.2 mg by mouth daily as needed (for gout flares).   Yes [provider]  diclofenac sodium (VOLTAREN) 1 % GEL Apply 2 g topically 4 (four) times daily as needed (pain).   Yes [provider]  docusate sodium (COLACE) 100 MG capsule Take 100 mg by mouth 2 (two) times daily.   Yes [provider]  Ferrous Sulfate (IRON) 28 MG TABS Take 28 mg by mouth. Take one tablet once daily   Yes [provider]  fluticasone (FLONASE) 50 MCG/ACT nasal spray Place 1 spray into both nostrils daily.    Yes [provider]  levothyroxine (SYNTHROID, LEVOTHROID) 75 MCG tablet Take 75 mcg by mouth daily before breakfast.    Yes [provider]  montelukast (SINGULAIR) 10 MG tablet Take 10 mg by mouth at bedtime.   Yes [provider]  predniSONE (DELTASONE) 2.5 MG tablet Take 2.5 mg by mouth daily with breakfast.    Yes [provider]  probenecid (BENEMID) 500 MG tablet Take 500 mg by mouth 2 (two) times daily. 09/05/18 09/05/19 Yes [provider]  rivaroxaban (XARELTO) 20 MG TABS tablet  Take 20 mg by mouth daily with supper.    Yes [provider]  SPIRIVA RESPIMAT 2.5 MCG/ACT AERS Inhale 2 puffs into the lungs daily. 01/05/18  Yes [provider]  spironolactone (ALDACTONE) 50 MG tablet Take 75 mg by mouth daily.   Yes [provider]  torsemide (DEMADEX) 20 MG tablet Take 20 mg by mouth daily.   Yes [provider]     Review of Systems  Constitutional: Positive for fatigue. Negative for appetite change.  HENT: Negative for congestion, postnasal drip and sore throat.   Eyes: Negative.   Respiratory: Positive for cough (productive) and shortness of breath (with minimal exertion). Negative for chest tightness.   Cardiovascular: Positive for chest pain (on occasion ) and leg swelling. Negative for palpitations.  Gastrointestinal: Positive for abdominal distention. Negative for abdominal pain.  Endocrine: Negative.  Genitourinary: Negative.   Musculoskeletal: Positive for arthralgias (right shoulder).  Skin: Negative.   Allergic/Immunologic: Negative.   Neurological: Positive for dizziness. Negative for light-headedness.  Hematological: Negative for adenopathy. Bruises/bleeds easily.  Psychiatric/Behavioral: Positive for sleep disturbance (didn't sleep well last night; wearing resmed ). Negative for dysphoric mood. The patient is not nervous/anxious.    Vitals:   01/25/19 1000  BP: (!) 138/98  Pulse: 84  Resp: 18  SpO2: 95%  Weight: 219 lb 8 oz (99.6 kg)  Height: 5' (1.524 m)   Wt Readings from Last 3 Encounters:  01/25/19 219 lb 8 oz (99.6 kg)  07/26/18 216 lb 12.8 oz (98.3 kg)  06/06/18 217 lb 4 oz (98.5 kg)   Lab Results  Component Value Date   CREATININE 0.87 03/30/2018   CREATININE 0.94 03/28/2018   CREATININE 1.00 10/23/2017    Physical Exam Vitals signs and nursing note reviewed.  Constitutional:      Appearance: She is well-developed.  HENT:     Head: Normocephalic and atraumatic.  Neck:      Musculoskeletal: Normal range of motion and neck supple.     Vascular: No JVD.  Cardiovascular:     Rate and Rhythm: Normal rate and regular rhythm.  Pulmonary:     Effort: Pulmonary effort is normal. No respiratory distress.     Breath sounds: No wheezing or rales.  Abdominal:     General: There is no distension.     Palpations: Abdomen is soft.  Musculoskeletal:     Right lower leg: She exhibits no tenderness. No edema.     Left lower leg: She exhibits no tenderness. No edema.  Skin:    General: Skin is warm and dry.  Neurological:     Mental Status: She is alert and oriented to person, place, and time.  Psychiatric:        Behavior: Behavior normal.    Assessment & Plan:  1: Chronic heart failure with reduced ejection fraction- - NYHA class III - euvolemic today - weighing daily and she was reminded to call for an overnight weight gain of >2 pounds or a weekly weight gain of >5 pounds - weight up 3 pounds from last visit 6 months ago - she is not adding salt to her food and tries to read food labels. Reviewed the importance of closely following a 2000mg  sodium diet  - she was unable to tolerate entresto due to hypotension - patient willing to try losartan 25mg  daily - sees cardiology September 2020 and she will ask them to check BMP; this was written on her AVS - saw cardiology Weyman Croon) 05/18/18 - BNP 03/28/18 was 77.0 - may benefit from cardiac rehab  2: HTN- - BP mildly elevated for her; adding losartan per above  - saw PCP ([facility]) 11/11/2018 - BMP 11/16/2018 reviewed and showed sodium 142, potassium 3.8, creatinine 0.9 & GFR 62  3: COPD- - saw pulmonology Rodena Piety) 11/21/2018 - PFT done June 2019 - 6 minute walk test completed 04/11/18 - concern regarding using a beta-blocker with the risk of bronchospasm; will defer to her cardiologist in this regard  4: Chronic atrial fibrillation- - pacemaker placed 06/16/17 - cardioversion done 07/20/17 - ablation done May 2019 -  device interrogated 05/18/18  5: Chest pain- - no chest pain today - advised her to follow-up with cardiology and she says that she will call them today  Patient did not bring her medications nor a list. Each medication was verbally reviewed with the  patient and she was encouraged to bring the bottles to every visit to confirm accuracy of list.  Return in 6 weeks or sooner for any questions/problems before then.

## 2019-01-25 ENCOUNTER — Encounter: Payer: Self-pay | Admitting: Family

## 2019-01-25 ENCOUNTER — Ambulatory Visit: Payer: Medicare Other | Attending: Family | Admitting: Family

## 2019-01-25 ENCOUNTER — Other Ambulatory Visit: Payer: Self-pay

## 2019-01-25 VITALS — BP 138/98 | HR 84 | Resp 18 | Ht 60.0 in | Wt 219.5 lb

## 2019-01-25 DIAGNOSIS — Z9049 Acquired absence of other specified parts of digestive tract: Secondary | ICD-10-CM | POA: Insufficient documentation

## 2019-01-25 DIAGNOSIS — Z7951 Long term (current) use of inhaled steroids: Secondary | ICD-10-CM | POA: Diagnosis not present

## 2019-01-25 DIAGNOSIS — Z888 Allergy status to other drugs, medicaments and biological substances status: Secondary | ICD-10-CM | POA: Diagnosis not present

## 2019-01-25 DIAGNOSIS — J449 Chronic obstructive pulmonary disease, unspecified: Secondary | ICD-10-CM | POA: Insufficient documentation

## 2019-01-25 DIAGNOSIS — I428 Other cardiomyopathies: Secondary | ICD-10-CM | POA: Insufficient documentation

## 2019-01-25 DIAGNOSIS — R079 Chest pain, unspecified: Secondary | ICD-10-CM

## 2019-01-25 DIAGNOSIS — E669 Obesity, unspecified: Secondary | ICD-10-CM | POA: Insufficient documentation

## 2019-01-25 DIAGNOSIS — Z880 Allergy status to penicillin: Secondary | ICD-10-CM | POA: Diagnosis not present

## 2019-01-25 DIAGNOSIS — Z87891 Personal history of nicotine dependence: Secondary | ICD-10-CM | POA: Insufficient documentation

## 2019-01-25 DIAGNOSIS — Z6841 Body Mass Index (BMI) 40.0 and over, adult: Secondary | ICD-10-CM | POA: Diagnosis not present

## 2019-01-25 DIAGNOSIS — I10 Essential (primary) hypertension: Secondary | ICD-10-CM

## 2019-01-25 DIAGNOSIS — Z803 Family history of malignant neoplasm of breast: Secondary | ICD-10-CM | POA: Diagnosis not present

## 2019-01-25 DIAGNOSIS — Z7901 Long term (current) use of anticoagulants: Secondary | ICD-10-CM | POA: Insufficient documentation

## 2019-01-25 DIAGNOSIS — Z79890 Hormone replacement therapy (postmenopausal): Secondary | ICD-10-CM | POA: Insufficient documentation

## 2019-01-25 DIAGNOSIS — I130 Hypertensive heart and chronic kidney disease with heart failure and stage 1 through stage 4 chronic kidney disease, or unspecified chronic kidney disease: Secondary | ICD-10-CM | POA: Diagnosis present

## 2019-01-25 DIAGNOSIS — I5022 Chronic systolic (congestive) heart failure: Secondary | ICD-10-CM | POA: Diagnosis present

## 2019-01-25 DIAGNOSIS — I482 Chronic atrial fibrillation, unspecified: Secondary | ICD-10-CM | POA: Diagnosis not present

## 2019-01-25 DIAGNOSIS — J410 Simple chronic bronchitis: Secondary | ICD-10-CM

## 2019-01-25 DIAGNOSIS — Z79899 Other long term (current) drug therapy: Secondary | ICD-10-CM | POA: Insufficient documentation

## 2019-01-25 DIAGNOSIS — Z7952 Long term (current) use of systemic steroids: Secondary | ICD-10-CM | POA: Diagnosis not present

## 2019-01-25 DIAGNOSIS — N189 Chronic kidney disease, unspecified: Secondary | ICD-10-CM | POA: Diagnosis not present

## 2019-01-25 DIAGNOSIS — Z885 Allergy status to narcotic agent status: Secondary | ICD-10-CM | POA: Diagnosis not present

## 2019-01-25 DIAGNOSIS — Z881 Allergy status to other antibiotic agents status: Secondary | ICD-10-CM | POA: Insufficient documentation

## 2019-01-25 DIAGNOSIS — M109 Gout, unspecified: Secondary | ICD-10-CM | POA: Diagnosis not present

## 2019-01-25 DIAGNOSIS — Z950 Presence of cardiac pacemaker: Secondary | ICD-10-CM | POA: Diagnosis not present

## 2019-01-25 DIAGNOSIS — I4821 Permanent atrial fibrillation: Secondary | ICD-10-CM

## 2019-01-25 MED ORDER — LOSARTAN POTASSIUM 25 MG PO TABS: 25.0000 mg | ORAL_TABLET | Freq: Every day | ORAL | 3 refills | Status: DC

## 2019-01-25 NOTE — Patient Instructions (Addendum)
Continue weighing daily and call for an overnight weight gain of > 2 pounds or a weekly weight gain of >5 pounds.  Ask cardiologist about checking BMP (lab work to check potassium and kidney function) at your next visit with him.

## 2019-02-02 ENCOUNTER — Telehealth: Payer: Self-pay | Admitting: Family

## 2019-02-02 MED ORDER — LOSARTAN POTASSIUM 25 MG PO TABS: 12.5000 mg | ORAL_TABLET | Freq: Every day | ORAL | 3 refills | Status: DC

## 2019-02-02 NOTE — Telephone Encounter (Signed)
Patient called to say that she had a BP drop after taking the losartan with everything else. She said that her BP before taking the medication was 125/77 and about an hour afterwards, it was 96/76. She says that she did feel dizzy when it dropped as well.   Advised patient to cut the losartan in 1/2 and take 1/2 tablet (12.5mg ) daily. She can also take it at a different time of day instead of taking it with her other medications if that worked better for her.   Patient verbalized understanding and will let us know if the low BP continues.Entresto had previously also caused hypotension.

## 2019-02-07 ENCOUNTER — Telehealth: Payer: Self-pay | Admitting: Family

## 2019-02-07 NOTE — Telephone Encounter (Signed)
Patient called to report her BP readings at home. She says that they've ranged from 116-150/67-85 without taking the losartan. She did have dizziness with taking losartan previously.   Advised patient to take the 1/2 tablet of losartan at bedtime (12.5mg ) and see if she can tolerate it. She will call us back in 3 days to update on BP and symptoms. Patient is agreeable to this plan.

## 2019-02-21 ENCOUNTER — Ambulatory Visit
Admission: RE | Admit: 2019-02-21 | Discharge: 2019-02-21 | Disposition: A | Discharge status: Home / Self Care | Payer: Medicare Other | Source: Ambulatory Visit | Attending: Internal Medicine | Admitting: Internal Medicine

## 2019-02-21 DIAGNOSIS — Z1231 Encounter for screening mammogram for malignant neoplasm of breast: Secondary | ICD-10-CM | POA: Diagnosis present

## 2019-02-24 DIAGNOSIS — G720 Drug-induced myopathy: Secondary | ICD-10-CM | POA: Insufficient documentation

## 2019-02-24 DIAGNOSIS — T466X5A Adverse effect of antihyperlipidemic and antiarteriosclerotic drugs, initial encounter: Secondary | ICD-10-CM | POA: Insufficient documentation

## 2019-03-08 ENCOUNTER — Ambulatory Visit: Payer: Medicare Other | Admitting: Family

## 2019-03-14 ENCOUNTER — Emergency Department (HOSPITAL_COMMUNITY)
Admission: EM | Admit: 2019-03-14 | Discharge: 2019-03-15 | Disposition: A | Discharge status: Home / Self Care | Payer: Medicare Other | Attending: Emergency Medicine | Admitting: Emergency Medicine

## 2019-03-14 ENCOUNTER — Other Ambulatory Visit: Payer: Self-pay

## 2019-03-14 ENCOUNTER — Emergency Department (HOSPITAL_COMMUNITY): Payer: Medicare Other

## 2019-03-14 DIAGNOSIS — Z79899 Other long term (current) drug therapy: Secondary | ICD-10-CM | POA: Diagnosis not present

## 2019-03-14 DIAGNOSIS — Z87891 Personal history of nicotine dependence: Secondary | ICD-10-CM | POA: Insufficient documentation

## 2019-03-14 DIAGNOSIS — Z950 Presence of cardiac pacemaker: Secondary | ICD-10-CM | POA: Insufficient documentation

## 2019-03-14 DIAGNOSIS — I110 Hypertensive heart disease with heart failure: Secondary | ICD-10-CM | POA: Diagnosis not present

## 2019-03-14 DIAGNOSIS — R0602 Shortness of breath: Secondary | ICD-10-CM | POA: Diagnosis present

## 2019-03-14 DIAGNOSIS — Z7901 Long term (current) use of anticoagulants: Secondary | ICD-10-CM | POA: Insufficient documentation

## 2019-03-14 DIAGNOSIS — J441 Chronic obstructive pulmonary disease with (acute) exacerbation: Secondary | ICD-10-CM

## 2019-03-14 DIAGNOSIS — I5022 Chronic systolic (congestive) heart failure: Secondary | ICD-10-CM | POA: Insufficient documentation

## 2019-03-14 LAB — BASIC METABOLIC PANEL
Anion gap: 13 (ref 5–15)
BUN: 25 mg/dL — ABNORMAL HIGH (ref 8–23)
CO2: 25 mmol/L (ref 22–32)
Calcium: 9.7 mg/dL (ref 8.9–10.3)
Chloride: 99 mmol/L (ref 98–111)
Creatinine, Ser: 1.17 mg/dL — ABNORMAL HIGH (ref 0.44–1.00)
GFR calc Af Amer: 54 mL/min — ABNORMAL LOW (ref >60)
GFR calc non Af Amer: 47 mL/min — ABNORMAL LOW (ref >60)
Glucose, Bld: 160 mg/dL — ABNORMAL HIGH (ref 70–99)
Potassium: 3.9 mmol/L (ref 3.5–5.1)
Sodium: 137 mmol/L (ref 135–145)

## 2019-03-14 LAB — CBC
HCT: 44.3 % (ref 36.0–46.0)
Hemoglobin: 15.1 g/dL — ABNORMAL HIGH (ref 12.0–15.0)
MCH: 30.4 pg (ref 26.0–34.0)
MCHC: 34.1 g/dL (ref 30.0–36.0)
MCV: 89.3 fL (ref 80.0–100.0)
Platelets: 224 10*3/uL (ref 150–400)
RBC: 4.96 MIL/uL (ref 3.87–5.11)
RDW: 14.1 % (ref 11.5–15.5)
WBC: 13.1 10*3/uL — ABNORMAL HIGH (ref 4.0–10.5)
nRBC: 0 % (ref 0.0–0.2)

## 2019-03-14 LAB — TROPONIN I (HIGH SENSITIVITY)
Troponin I (High Sensitivity): 7 ng/L (ref <18)
Troponin I (High Sensitivity): 8 ng/L (ref <18)

## 2019-03-14 MED ORDER — FUROSEMIDE 10 MG/ML IJ SOLN
40.0000 mg | Freq: Once | INTRAMUSCULAR | Status: AC
  Administered 2019-03-15: 40 mg via INTRAVENOUS
  Filled 2019-03-14: qty 4

## 2019-03-14 MED ORDER — SODIUM CHLORIDE 0.9% FLUSH: 3.0000 mL | Freq: Once | INTRAVENOUS | Status: DC

## 2019-03-14 MED ORDER — METHYLPREDNISOLONE SODIUM SUCC 125 MG IJ SOLR
125.0000 mg | Freq: Once | INTRAMUSCULAR | Status: AC
  Administered 2019-03-15: 125 mg via INTRAVENOUS
  Filled 2019-03-14: qty 2

## 2019-03-14 NOTE — ED Notes (Signed)
SpO2 99% on 3lpm sitting on side of bed. SpO2 98% on 3lpm while ambulating.  SpO2 92% on no O2 while ambulating.  After ambulating without oxygen pt appears winded. Pt complained of shortness of breath when ambulating without oxygen.

## 2019-03-14 NOTE — ED Triage Notes (Signed)
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 Endorses intermittent chest pain and cough x 1 month. Pt wears 3L O2 at home, was just wearing it at night but now her shob is requiring her to wear it 24/7. Endorses hypoxia with exertion if not on oxygen. Hx COPD.

## 2019-03-14 NOTE — ED Notes (Signed)
Ria in lab advised she would add on BNP to previously collected blood

## 2019-03-15 DIAGNOSIS — J441 Chronic obstructive pulmonary disease with (acute) exacerbation: Secondary | ICD-10-CM | POA: Diagnosis not present

## 2019-03-15 LAB — BRAIN NATRIURETIC PEPTIDE: B Natriuretic Peptide: 80.3 pg/mL (ref 0.0–100.0)

## 2019-03-15 NOTE — ED Provider Notes (Signed)
[facility] EMERGENCY DEPARTMENT Provider Note   CSN: [id number] Arrival date & time: 03/14/19  1706     History   Chief Complaint Chief Complaint  Patient presents with  . Shortness of Breath    HPI Kathleen [name] is a 73 y.o. female presenting for evaluation of shortness of breath.  Patient states the past 4 days, she has had worsening shortness of breath.  Shortness breath is worse with exertion.  She has a history of COPD, is not on oxygen at night, but has been needing to wear it during the day.  Over the past 4 months, she has had multiple occurrences of bronchitis.  She called her doctor, was started on prednisone 3 days ago for her worsening shortness of breath.  Patient states that when she uses her nebulized albuterol, symptoms improved for a short period of time, however she is only been using this twice a day.  Symptoms improve when she takes Mucinex, which she has been using intermittently.  She denies fevers, chills, sore throat, chest pain, nausea, vomiting, abdominal pain, urinary symptoms, abnormal bowel movements.  She is on multiple preventatives inhalers, managed by pulmonology.  Patient also with a history of CHF, has been taking furosemide and spironolactone as prescribed.  History of paroxysmal A. fib, is on Xarelto.  Patient had similar symptoms last month, she was seen at [facility] and had a PE study which was negative.     HPI  Past Medical History:  Diagnosis Date  . Asthma   . Atypical atrial flutter (HCC)   . 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HCC)   . CHF (congestive heart failure) (HCC)   . Chronic renal insufficiency   . 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 COPD (chronic obstructive pulmonary disease) (HCC)   . Gout   . Hypertension   . Longstanding persistent atrial fibrillation   . Nonischemic cardiomyopathy (HCC)   . Obesity   . Pacemaker   . Spontaneous pneumothorax 2013    Patient  Active Problem List   Diagnosis Date Noted  . Chronic systolic heart failure (HCC) 04/06/2018  . HTN (hypertension) 04/06/2018  . COPD (chronic obstructive pulmonary disease) (HCC) 04/06/2018  . Atrial fibrillation (HCC) 04/06/2018  . Acute bronchitis 09/17/2015  . Malnutrition of moderate degree 09/11/2015  . Fever 09/08/2015  . Hyponatremia 09/08/2015    Past Surgical History:  Procedure Laterality Date  . ABDOMINAL HYSTERECTOMY    . ATRIAL FIBRILLATION ABLATION  07/20/2013   by Dr Christin Fudge  . AV nodal ablation  11/01/2013   by Dr Christin Fudge, repeated by Dr Wilford Grist  . BREAST BIOPSY Bilateral 1997   negative  . CARDIAC CATHETERIZATION    . CHOLECYSTECTOMY    . HERNIA REPAIR    . PACEMAKER INSERTION  06/2017   MDT Viva CRT-P implanted by Dr Christin Fudge after AV nodal ablation,  LV lead could not be placed     OB History   No obstetric history on file.      Home Medications    Prior to Admission medications   Medication Sig Start Date End Date Taking? Authorizing Provider  albuterol (PROVENTIL HFA;VENTOLIN HFA) 108 (90 Base) MCG/ACT inhaler Inhale 2 puffs into the lungs every 4 (four) hours as needed for wheezing or shortness of breath.   Yes [provider]  albuterol (PROVENTIL) (2.5 MG/3ML) 0.083% nebulizer solution Take 3 mLs (2.5 mg total) by nebulization every 6 (six) hours as needed for  wheezing or shortness of breath. 09/23/15  Yes Gouru, Illene Silver, MD  azithromycin (ZITHROMAX) 250 MG tablet Take 250 mg by mouth at bedtime.    Yes [provider]  budesonide (PULMICORT) 0.25 MG/2ML nebulizer solution Take 0.25 mg by nebulization 2 (two) times daily.  02/22/19  Yes [provider]  budesonide-formoterol (SYMBICORT) 160-4.5 MCG/ACT inhaler Inhale 2 puffs into the lungs 2 (two) times daily.   Yes [provider]  cholecalciferol (VITAMIN D) 1000 units tablet Take 2,000 Units by mouth at bedtime.   Yes [provider]  colchicine 0.6 MG  tablet Take 0.6-1.2 mg by mouth daily as needed (for gout flares).   Yes [provider]  diclofenac sodium (VOLTAREN) 1 % GEL Apply 2 g topically 4 (four) times daily as needed (pain).   Yes [provider]  docusate sodium (COLACE) 100 MG capsule Take 100 mg by mouth 2 (two) times daily.   Yes [provider]  Ferrous Sulfate (IRON) 28 MG TABS Take 28 mg by mouth daily.    Yes [provider]  fluticasone (FLONASE) 50 MCG/ACT nasal spray Place 1 spray into both nostrils daily.    Yes [provider]  levothyroxine (SYNTHROID, LEVOTHROID) 75 MCG tablet Take 75 mcg by mouth daily before breakfast.    Yes [provider]  losartan (COZAAR) 25 MG tablet Take 0.5 tablets (12.5 mg total) by mouth daily. 02/02/19 05/03/19 Yes Hackney, Tina A, FNP  montelukast (SINGULAIR) 10 MG tablet Take 10 mg by mouth at bedtime.   Yes [provider]  predniSONE (DELTASONE) 10 MG tablet Take 10-30 mg by mouth See admin instructions. Take 3 tablets for 5 days then take 2 tablets for 5 days then take 1 tablet for 5 days then stop   Yes [provider]  probenecid (BENEMID) 500 MG tablet Take 500 mg by mouth 2 (two) times daily. 09/05/18 09/05/19 Yes [provider]  rivaroxaban (XARELTO) 20 MG TABS tablet Take 20 mg by mouth daily with supper.    Yes [provider]  SPIRIVA RESPIMAT 2.5 MCG/ACT AERS Inhale 2 puffs into the lungs daily. 01/05/18  Yes [provider]  spironolactone (ALDACTONE) 50 MG tablet Take 75 mg by mouth daily.   Yes [provider]  torsemide (DEMADEX) 20 MG tablet Take 20 mg by mouth daily.   Yes [provider]  traMADol (ULTRAM) 50 MG tablet Take 50 mg by mouth 3 (three) times daily as needed for moderate pain.  02/24/19  Yes [provider]    Family History Family History  Problem Relation Age of Onset  . Breast cancer Mother 63       3 different times  . Breast cancer  Cousin     Social History Social History   Tobacco Use  . Smoking status: Former Research scientist (life sciences)  . Smokeless tobacco: Never Used  Substance Use Topics  . Alcohol use: No    Alcohol/week: 0.0 standard drinks  . Drug use: No     Allergies   Allopurinol, Clindamycin, Pneumococcal 13-val conj vacc, Dronedarone, Entresto [sacubitril-valsartan], Fosamax [alendronate sodium], Meperidine, Penicillins, Rosuvastatin, Tetracycline, Adhesive [tape], and Lovastatin   Review of Systems Review of Systems  Respiratory: Positive for cough and shortness of breath.   All other systems reviewed and are negative.    Physical Exam Updated Vital Signs BP 137/88   Pulse 69   Temp 98.3 F (36.8 C) (Oral)   Resp 16   SpO2 99%  Physical Exam Vitals signs and nursing note reviewed.  Constitutional:      General: She is not in acute distress.    Appearance: She is well-developed.     Comments: Obese female resting comfortably in the bed in no acute distress  HENT:     Head: Normocephalic and atraumatic.  Eyes:     Conjunctiva/sclera: Conjunctivae normal.     Pupils: Pupils are equal, round, and reactive to light.  Neck:     Musculoskeletal: Normal range of motion and neck supple.  Cardiovascular:     Rate and Rhythm: Normal rate and regular rhythm.  Pulmonary:     Effort: Pulmonary effort is normal. No respiratory distress.     Breath sounds: Normal breath sounds. No wheezing.     Comments: Speaking in full sentences.  Clear lung sounds in all fields.  No signs of respiratory distress.  Hacking cough noted intermittently throughout exam. Abdominal:     General: There is no distension.     Palpations: Abdomen is soft. There is no mass.     Tenderness: There is no abdominal tenderness. There is no guarding or rebound.  Musculoskeletal: Normal range of motion.  Skin:    General: Skin is warm and dry.     Capillary Refill: Capillary refill takes less than 2 seconds.  Neurological:     Mental  Status: She is alert and oriented to person, place, and time.      ED Treatments / Results  Labs (all labs ordered are listed, but only abnormal results are displayed) Labs Reviewed  BASIC METABOLIC PANEL - Abnormal; Notable for the following components:      Result Value   Glucose, Bld 160 (*)    BUN 25 (*)    Creatinine, Ser 1.17 (*)    GFR calc non Af Amer 47 (*)    GFR calc Af Amer 54 (*)    All other components within normal limits  CBC - Abnormal; Notable for the following components:   WBC 13.1 (*)    Hemoglobin 15.1 (*)    All other components within normal limits  BRAIN NATRIURETIC PEPTIDE  BRAIN NATRIURETIC PEPTIDE  TROPONIN I (HIGH SENSITIVITY)  TROPONIN I (HIGH SENSITIVITY)    EKG None  Radiology Dg Chest 2 View  Result Date: 03/14/2019 CLINICAL DATA:  Chest pain and shortness of breath. EXAM: CHEST - 2 VIEW COMPARISON:  03/28/2018 FINDINGS: Stable mild cardiomegaly. Transvenous pacemaker remains in appropriate position. Bibasilar scarring remains stable. No evidence of acute infiltrate or pleural effusion. IMPRESSION: Stable mild cardiomegaly and bibasilar scarring. No acute findings. Electronically Signed   By: Danae OrleansJohn A Stahl M.D.   On: 03/14/2019 17:43    Procedures Procedures (including critical care time)  Medications Ordered in ED Medications  sodium chloride flush (NS) 0.9 % injection 3 mL (has no administration in time range)  furosemide (LASIX) injection 40 mg (40 mg Intravenous Given 03/15/19 0007)  methylPREDNISolone sodium succinate (SOLU-MEDROL) 125 mg/2 mL injection 125 mg (125 mg Intravenous Given 03/15/19 0007)     Initial Impression / Assessment and Plan / ED Course  I have reviewed the triage vital signs and the nursing notes.  Pertinent labs & imaging results that were available during my care of the patient were reviewed by me and considered in my medical decision making (see chart for details).        Patient presenting for  evaluation of worsening shortness of breath.  Physical exam shows patient who appears nontoxic.  She is resting comfortably, no signs of respiratory distress.  Sats are stable at rest, although she is on oxygen.  Will test while she is not on oxygen.  Lung sounds are clear, especially after patient coughs.  Consistent with bronchitis, patient has had multiple flareups in the past several months.  Consider worsening COPD.  Consider pneumonia, although lower suspicion as patient is afebrile and lungs are clear.  Consider CHF exacerbation, although no obvious rales.  Doubt PE, as patient is on Xarelto and had similar symptoms with a negative PE study recently.  Labs reassuring.  Slight leukocytosis at 13, but doubt infection at this time.  Stable electrolytes.  Troponin stable.  BNP negative at 80.  Chest x-ray viewed interpreted by me, no pneumonia, pneumothorax, or effusion, shows stable cardiomegaly.  EKG without STEMI.  Case discussed with attending, Dr. Wilkie Aye evaluated the patient.  Sats while ambulating dropped from baseline without oxygen, however did not go below 92.  Patient was slightly more short of breath, but no respiratory distress.  Symptoms are likely due to COPD exacerbation.  As patient has already been started on prednisone, and is already on preventative and daily albuterol, not much more treatment to give.  Discussed increased use of albuterol well bronchitis flare is happening.  Discussed follow-up with pulmonologist, patient has an appointment in 2 days.  Discussed use of oxygen at home, patient can increase her oxygen as needed for shortness of breath.  I do not believe that she would benefit from an admission at this time, but would do better with pulmonology apt. At this time, pt appears safe for d/c. Return precautions given. Pt states she understands and agrees to plan.   Final Clinical Impressions(s) / ED Diagnoses   Final diagnoses:  COPD exacerbation (HCC)  SOB (shortness of  breath)    ED Discharge Orders    None       Alveria Apley, PA-C 03/15/19 0202    Shon Baton, MD 03/15/19 [phone]

## 2019-03-15 NOTE — Discharge Instructions (Addendum)
Continue taking home medications as prescribed. Start using your albuterol every 4 hours while awake for the next 2 days. Follow-up with your pulmonologist at your scheduled appointment on Thursday. Return to the emergency room if you develop fevers, worsening shortness of breath, or any new, worsening, concerning symptoms.

## 2019-03-15 NOTE — ED Notes (Signed)
Patient verbalized understanding of dc instructions, vss, ambulatory with nad.   

## 2019-03-20 ENCOUNTER — Ambulatory Visit: Payer: Medicare Other | Admitting: Family

## 2019-03-30 ENCOUNTER — Ambulatory Visit: Payer: Self-pay | Admitting: Urology

## 2019-04-10 ENCOUNTER — Ambulatory Visit: Payer: Medicare Other | Admitting: Family

## 2019-06-26 ENCOUNTER — Emergency Department: Payer: Medicare Other

## 2019-06-26 ENCOUNTER — Encounter: Payer: Self-pay | Admitting: Emergency Medicine

## 2019-06-26 ENCOUNTER — Inpatient Hospital Stay
Admission: EM | Admit: 2019-06-26 | Discharge: 2019-06-28 | DRG: 641 | Disposition: A | Discharge status: Home / Self Care | Payer: Medicare Other | Attending: Internal Medicine | Admitting: Internal Medicine

## 2019-06-26 DIAGNOSIS — N189 Chronic kidney disease, unspecified: Secondary | ICD-10-CM | POA: Diagnosis not present

## 2019-06-26 DIAGNOSIS — Z888 Allergy status to other drugs, medicaments and biological substances status: Secondary | ICD-10-CM | POA: Diagnosis not present

## 2019-06-26 DIAGNOSIS — I442 Atrioventricular block, complete: Secondary | ICD-10-CM | POA: Diagnosis present

## 2019-06-26 DIAGNOSIS — K5900 Constipation, unspecified: Secondary | ICD-10-CM | POA: Diagnosis not present

## 2019-06-26 DIAGNOSIS — Z881 Allergy status to other antibiotic agents status: Secondary | ICD-10-CM | POA: Diagnosis not present

## 2019-06-26 DIAGNOSIS — Z79899 Other long term (current) drug therapy: Secondary | ICD-10-CM

## 2019-06-26 DIAGNOSIS — Z950 Presence of cardiac pacemaker: Secondary | ICD-10-CM | POA: Diagnosis not present

## 2019-06-26 DIAGNOSIS — Z880 Allergy status to penicillin: Secondary | ICD-10-CM

## 2019-06-26 DIAGNOSIS — I4821 Permanent atrial fibrillation: Secondary | ICD-10-CM | POA: Diagnosis not present

## 2019-06-26 DIAGNOSIS — Z7901 Long term (current) use of anticoagulants: Secondary | ICD-10-CM | POA: Diagnosis not present

## 2019-06-26 DIAGNOSIS — R1084 Generalized abdominal pain: Secondary | ICD-10-CM | POA: Diagnosis not present

## 2019-06-26 DIAGNOSIS — E869 Volume depletion, unspecified: Secondary | ICD-10-CM | POA: Diagnosis present

## 2019-06-26 DIAGNOSIS — N179 Acute kidney failure, unspecified: Secondary | ICD-10-CM

## 2019-06-26 DIAGNOSIS — J8489 Other specified interstitial pulmonary diseases: Secondary | ICD-10-CM | POA: Diagnosis present

## 2019-06-26 DIAGNOSIS — I4891 Unspecified atrial fibrillation: Secondary | ICD-10-CM | POA: Diagnosis present

## 2019-06-26 DIAGNOSIS — I10 Essential (primary) hypertension: Secondary | ICD-10-CM | POA: Diagnosis present

## 2019-06-26 DIAGNOSIS — I4811 Longstanding persistent atrial fibrillation: Secondary | ICD-10-CM | POA: Diagnosis present

## 2019-06-26 DIAGNOSIS — I428 Other cardiomyopathies: Secondary | ICD-10-CM | POA: Diagnosis present

## 2019-06-26 DIAGNOSIS — M109 Gout, unspecified: Secondary | ICD-10-CM | POA: Diagnosis present

## 2019-06-26 DIAGNOSIS — I484 Atypical atrial flutter: Secondary | ICD-10-CM | POA: Diagnosis present

## 2019-06-26 DIAGNOSIS — I130 Hypertensive heart and chronic kidney disease with heart failure and stage 1 through stage 4 chronic kidney disease, or unspecified chronic kidney disease: Secondary | ICD-10-CM | POA: Diagnosis present

## 2019-06-26 DIAGNOSIS — E039 Hypothyroidism, unspecified: Secondary | ICD-10-CM | POA: Diagnosis present

## 2019-06-26 DIAGNOSIS — J9610 Chronic respiratory failure, unspecified whether with hypoxia or hypercapnia: Secondary | ICD-10-CM | POA: Diagnosis present

## 2019-06-26 DIAGNOSIS — Z9981 Dependence on supplemental oxygen: Secondary | ICD-10-CM | POA: Diagnosis not present

## 2019-06-26 DIAGNOSIS — Z91048 Other nonmedicinal substance allergy status: Secondary | ICD-10-CM

## 2019-06-26 DIAGNOSIS — Z20822 Contact with and (suspected) exposure to covid-19: Secondary | ICD-10-CM | POA: Diagnosis present

## 2019-06-26 DIAGNOSIS — Z79890 Hormone replacement therapy (postmenopausal): Secondary | ICD-10-CM | POA: Diagnosis not present

## 2019-06-26 DIAGNOSIS — E860 Dehydration: Secondary | ICD-10-CM | POA: Diagnosis present

## 2019-06-26 DIAGNOSIS — Z803 Family history of malignant neoplasm of breast: Secondary | ICD-10-CM | POA: Diagnosis not present

## 2019-06-26 DIAGNOSIS — N1831 Chronic kidney disease, stage 3a: Secondary | ICD-10-CM | POA: Diagnosis present

## 2019-06-26 DIAGNOSIS — Z87891 Personal history of nicotine dependence: Secondary | ICD-10-CM

## 2019-06-26 DIAGNOSIS — R1013 Epigastric pain: Secondary | ICD-10-CM | POA: Diagnosis not present

## 2019-06-26 DIAGNOSIS — I5032 Chronic diastolic (congestive) heart failure: Secondary | ICD-10-CM | POA: Diagnosis present

## 2019-06-26 DIAGNOSIS — Z7951 Long term (current) use of inhaled steroids: Secondary | ICD-10-CM

## 2019-06-26 DIAGNOSIS — J449 Chronic obstructive pulmonary disease, unspecified: Secondary | ICD-10-CM | POA: Diagnosis present

## 2019-06-26 DIAGNOSIS — T471X5A Adverse effect of other antacids and anti-gastric-secretion drugs, initial encounter: Secondary | ICD-10-CM | POA: Diagnosis present

## 2019-06-26 DIAGNOSIS — J439 Emphysema, unspecified: Secondary | ICD-10-CM | POA: Diagnosis present

## 2019-06-26 DIAGNOSIS — E210 Primary hyperparathyroidism: Secondary | ICD-10-CM | POA: Diagnosis present

## 2019-06-26 DIAGNOSIS — I5030 Unspecified diastolic (congestive) heart failure: Secondary | ICD-10-CM | POA: Diagnosis present

## 2019-06-26 LAB — COMPREHENSIVE METABOLIC PANEL
ALT: 13 U/L (ref 0–44)
AST: 20 U/L (ref 15–41)
Albumin: 5 g/dL (ref 3.5–5.0)
Alkaline Phosphatase: 54 U/L (ref 38–126)
Anion gap: 16 — ABNORMAL HIGH (ref 5–15)
BUN: 28 mg/dL — ABNORMAL HIGH (ref 8–23)
CO2: 27 mmol/L (ref 22–32)
Calcium: 14.5 mg/dL (ref 8.9–10.3)
Chloride: 92 mmol/L — ABNORMAL LOW (ref 98–111)
Creatinine, Ser: 1.66 mg/dL — ABNORMAL HIGH (ref 0.44–1.00)
GFR calc Af Amer: 35 mL/min — ABNORMAL LOW (ref >60)
GFR calc non Af Amer: 30 mL/min — ABNORMAL LOW (ref >60)
Glucose, Bld: 200 mg/dL — ABNORMAL HIGH (ref 70–99)
Potassium: 3.8 mmol/L (ref 3.5–5.1)
Sodium: 135 mmol/L (ref 135–145)
Total Bilirubin: 1.4 mg/dL — ABNORMAL HIGH (ref 0.3–1.2)
Total Protein: 8.1 g/dL (ref 6.5–8.1)

## 2019-06-26 LAB — POC SARS CORONAVIRUS 2 AG: SARS Coronavirus 2 Ag: NEGATIVE

## 2019-06-26 LAB — URINALYSIS, COMPLETE (UACMP) WITH MICROSCOPIC
Bacteria, UA: NONE SEEN
Bilirubin Urine: NEGATIVE
Glucose, UA: NEGATIVE mg/dL
Hgb urine dipstick: NEGATIVE
Ketones, ur: NEGATIVE mg/dL
Nitrite: NEGATIVE
Protein, ur: NEGATIVE mg/dL
Specific Gravity, Urine: 1.012 (ref 1.005–1.030)
pH: 5 (ref 5.0–8.0)

## 2019-06-26 LAB — CBC
HCT: 50.8 % — ABNORMAL HIGH (ref 36.0–46.0)
Hemoglobin: 17 g/dL — ABNORMAL HIGH (ref 12.0–15.0)
MCH: 28.5 pg (ref 26.0–34.0)
MCHC: 33.5 g/dL (ref 30.0–36.0)
MCV: 85.2 fL (ref 80.0–100.0)
Platelets: 269 10*3/uL (ref 150–400)
RBC: 5.96 MIL/uL — ABNORMAL HIGH (ref 3.87–5.11)
RDW: 13.5 % (ref 11.5–15.5)
WBC: 15.4 10*3/uL — ABNORMAL HIGH (ref 4.0–10.5)
nRBC: 0 % (ref 0.0–0.2)

## 2019-06-26 LAB — LIPASE, BLOOD: Lipase: 25 U/L (ref 11–51)

## 2019-06-26 LAB — VITAMIN D 25 HYDROXY (VIT D DEFICIENCY, FRACTURES): Vit D, 25-Hydroxy: 35.69 ng/mL (ref 30–100)

## 2019-06-26 MED ORDER — IOHEXOL 300 MG/ML  SOLN
75.0000 mL | Freq: Once | INTRAMUSCULAR | Status: AC | PRN
  Administered 2019-06-26: 75 mL via INTRAVENOUS

## 2019-06-26 MED ORDER — MOMETASONE FURO-FORMOTEROL FUM 200-5 MCG/ACT IN AERO
2.0000 | INHALATION_SPRAY | Freq: Two times a day (BID) | RESPIRATORY_TRACT | Status: DC
  Administered 2019-06-26 – 2019-06-28 (×4): 2 via RESPIRATORY_TRACT
  Filled 2019-06-26: qty 8.8

## 2019-06-26 MED ORDER — RIVAROXABAN 20 MG PO TABS
20.0000 mg | ORAL_TABLET | Freq: Every day | ORAL | Status: DC
  Administered 2019-06-26 – 2019-06-28 (×3): 20 mg via ORAL
  Filled 2019-06-26 (×3): qty 1

## 2019-06-26 MED ORDER — SODIUM CHLORIDE 0.9 % IV BOLUS: 1000.0000 mL | Freq: Once | INTRAVENOUS | Status: DC

## 2019-06-26 MED ORDER — SODIUM CHLORIDE 0.9 % IV SOLN: INTRAVENOUS | Status: DC

## 2019-06-26 MED ORDER — ACETAMINOPHEN 325 MG PO TABS: 650.0000 mg | ORAL_TABLET | Freq: Four times a day (QID) | ORAL | Status: DC | PRN

## 2019-06-26 MED ORDER — ONDANSETRON HCL 4 MG PO TABS: 4.0000 mg | ORAL_TABLET | Freq: Four times a day (QID) | ORAL | Status: DC | PRN

## 2019-06-26 MED ORDER — SENNOSIDES-DOCUSATE SODIUM 8.6-50 MG PO TABS
2.0000 | ORAL_TABLET | Freq: Two times a day (BID) | ORAL | Status: DC
  Administered 2019-06-26 – 2019-06-28 (×4): 2 via ORAL
  Filled 2019-06-26 (×4): qty 2

## 2019-06-26 MED ORDER — SODIUM CHLORIDE 0.9 % IV SOLN: Freq: Once | INTRAVENOUS | Status: DC

## 2019-06-26 MED ORDER — MORPHINE SULFATE (PF) 4 MG/ML IV SOLN
4.0000 mg | INTRAVENOUS | Status: DC | PRN
  Administered 2019-06-26: 4 mg via INTRAVENOUS
  Filled 2019-06-26: qty 1

## 2019-06-26 MED ORDER — SODIUM CHLORIDE 0.9% FLUSH: 3.0000 mL | Freq: Once | INTRAVENOUS | Status: DC

## 2019-06-26 MED ORDER — OXYCODONE HCL 5 MG PO TABS: 5.0000 mg | ORAL_TABLET | ORAL | Status: DC | PRN

## 2019-06-26 MED ORDER — FLUTICASONE PROPIONATE 50 MCG/ACT NA SUSP
1.0000 | Freq: Every day | NASAL | Status: DC
  Administered 2019-06-26 – 2019-06-28 (×3): 1 via NASAL
  Filled 2019-06-26: qty 16

## 2019-06-26 MED ORDER — PREDNISONE 20 MG PO TABS
10.0000 mg | ORAL_TABLET | Freq: Every day | ORAL | Status: DC
  Administered 2019-06-27 – 2019-06-28 (×2): 10 mg via ORAL
  Filled 2019-06-26 (×2): qty 1

## 2019-06-26 MED ORDER — ACETAMINOPHEN 650 MG RE SUPP: 650.0000 mg | Freq: Four times a day (QID) | RECTAL | Status: DC | PRN

## 2019-06-26 MED ORDER — TIOTROPIUM BROMIDE MONOHYDRATE 18 MCG IN CAPS
1.0000 | ORAL_CAPSULE | Freq: Every day | RESPIRATORY_TRACT | Status: DC
  Administered 2019-06-27 – 2019-06-28 (×2): 18 ug via RESPIRATORY_TRACT
  Filled 2019-06-26: qty 5

## 2019-06-26 MED ORDER — BISACODYL 10 MG RE SUPP
10.0000 mg | Freq: Every day | RECTAL | Status: DC | PRN
  Administered 2019-06-27: 10 mg via RECTAL
  Filled 2019-06-26: qty 1

## 2019-06-26 MED ORDER — ALBUTEROL SULFATE HFA 108 (90 BASE) MCG/ACT IN AERS
2.0000 | INHALATION_SPRAY | RESPIRATORY_TRACT | Status: DC | PRN
  Filled 2019-06-26: qty 6.7

## 2019-06-26 MED ORDER — AZITHROMYCIN 250 MG PO TABS
250.0000 mg | ORAL_TABLET | ORAL | Status: DC
  Administered 2019-06-26 – 2019-06-28 (×2): 250 mg via ORAL
  Filled 2019-06-26 (×3): qty 1

## 2019-06-26 MED ORDER — ONDANSETRON HCL 4 MG/2ML IJ SOLN: 4.0000 mg | Freq: Four times a day (QID) | INTRAMUSCULAR | Status: DC | PRN

## 2019-06-26 MED ORDER — ONDANSETRON HCL 4 MG/2ML IJ SOLN
4.0000 mg | Freq: Once | INTRAMUSCULAR | Status: AC
  Administered 2019-06-26: 4 mg via INTRAVENOUS
  Filled 2019-06-26: qty 2

## 2019-06-26 MED ORDER — ALBUTEROL SULFATE (2.5 MG/3ML) 0.083% IN NEBU: 2.5000 mg | INHALATION_SOLUTION | Freq: Four times a day (QID) | RESPIRATORY_TRACT | Status: DC | PRN

## 2019-06-26 MED ORDER — POLYETHYLENE GLYCOL 3350 17 G PO PACK
17.0000 g | PACK | Freq: Every day | ORAL | Status: DC
  Administered 2019-06-27 – 2019-06-28 (×2): 17 g via ORAL
  Filled 2019-06-26 (×2): qty 1

## 2019-06-26 MED ORDER — SODIUM CHLORIDE 0.9 % IV BOLUS
500.0000 mL | Freq: Once | INTRAVENOUS | Status: AC
  Administered 2019-06-26: 500 mL via INTRAVENOUS

## 2019-06-26 MED ORDER — GUAIFENESIN ER 600 MG PO TB12
600.0000 mg | ORAL_TABLET | Freq: Two times a day (BID) | ORAL | Status: DC
  Administered 2019-06-26 – 2019-06-28 (×4): 600 mg via ORAL
  Filled 2019-06-26 (×4): qty 1

## 2019-06-26 MED ORDER — LEVOTHYROXINE SODIUM 50 MCG PO TABS
75.0000 ug | ORAL_TABLET | Freq: Every day | ORAL | Status: DC
  Administered 2019-06-27 – 2019-06-28 (×2): 75 ug via ORAL
  Filled 2019-06-26 (×2): qty 2

## 2019-06-26 NOTE — ED Triage Notes (Signed)
Lower abdominal pain x 1 week. Denies vomiting. Some discomfort with urination.

## 2019-06-26 NOTE — ED Notes (Signed)
Pt OK to eat per Dr. Allena Katz. Dr. Allena Katz at bedside.

## 2019-06-26 NOTE — H&P (Signed)
[facility] History and Physical   Patient: Kathleen [name] ZOX:[id number]   PCP: Marguarite Arbour, MD DOB: 1946-02-13   DOA: 06/26/2019   DOS: 06/26/2019   DOS: the patient was seen and examined on 06/26/2019  Patient coming from: The patient is coming from Home  Chief Complaint: Abdominal pain  HPI: Kathleen [name] is a 74 y.o. 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 Patient presents with complaints of abdominal pain ongoing since 06/16/2018. Patient started having severe abdominal pain located in her lower abdomen which was intermittent in nature. As the day progressed of abdominal pain progressively becoming more persistent with dull achiness She also started having nausea without any vomiting. She started feeling tired and fatigued and felt that she was actually dehydrated. She started noticing that her urine output was also less. She denies any dizziness or lightheadedness, no diarrhea or actually constipation. She denies any fever or chills denies any cough or worsening of her chronic shortness of breath from her asthma and COPD. She mentions she was compliant with all her medications. Last Friday she went to pharmacy and started taking some Tums. Since Friday so far she has taken 3-6 Tums on a daily basis. She denies any recent change in her medication. Patient gets her parathyroid levels checked on a yearly basis  ED Course: Presents with abdominal pain.  CT abdomen and pelvis was negative for any acute pathology. On my personal evaluation I do see a significant stool burden. Labs were positive for acute kidney injury as well as hypercalcemia. Patient was referred for admission for further work-up and treatment  At her baseline ambulates without assistance independent for most of her ADL;  manages her medication on her own.  Review of Systems: as mentioned in the history of present illness.  All other  systems reviewed and are negative.  Past Medical History:  Diagnosis Date  . Asthma   . Atypical atrial flutter (HCC)   . 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HCC)   . CHF (congestive heart failure) (HCC)   . Chronic renal insufficiency   . 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 COPD (chronic obstructive pulmonary disease) (HCC)   . Gout   . Hypertension   . Longstanding persistent atrial fibrillation (HCC)   . Nonischemic cardiomyopathy (HCC)   . Obesity   . Pacemaker   . Spontaneous pneumothorax 2013   Past Surgical History:  Procedure Laterality Date  . ABDOMINAL HYSTERECTOMY    . ATRIAL FIBRILLATION ABLATION  07/20/2013   by Dr Christin Fudge  . AV nodal ablation  11/01/2013   by Dr Christin Fudge, repeated by Dr Wilford Grist  . BREAST BIOPSY Bilateral 1997   negative  . CARDIAC CATHETERIZATION    . CHOLECYSTECTOMY    . HERNIA REPAIR    . PACEMAKER INSERTION  06/2017   MDT Viva CRT-P implanted by Dr Christin Fudge after AV nodal ablation,  LV lead could not be placed   Social History:  reports that she has quit smoking. She has never used smokeless tobacco. She reports that she does not drink alcohol or use drugs.  Allergies  Allergen Reactions  . Allopurinol Other (See Comments)    Reaction:  Dizziness   . Clindamycin Anaphylaxis and Hives  . Pneumococcal 13-Val Conj Vacc Itching, Swelling and Rash  . Dronedarone Rash  . Entresto [Sacubitril-Valsartan] Other (See Comments)    hypotension  .  Fosamax [Alendronate Sodium] Nausea Only  . Meperidine Nausea And Vomiting  . Penicillins Hives and Other (See Comments)    Has patient had a PCN reaction causing immediate rash, facial/tongue/throat swelling, SOB or lightheadedness with hypotension: No Has patient had a PCN reaction causing severe rash involving mucus membranes or skin necrosis: No Has patient had a PCN reaction that required hospitalization No Has patient had a PCN reaction occurring within the  last 10 years: No If all of the above answers are "NO", then may proceed with Cephalosporin use.  . Rosuvastatin Other (See Comments)    Reaction:  Muscle spasms   . Tetracycline Hives  . Adhesive [Tape] Rash  . Lovastatin Rash    Other reaction(s): Muscle Pain    Family history reviewed and not pertinent Family History  Problem Relation Age of Onset  . Breast cancer Mother 55       3 different times  . Breast cancer Cousin      Prior to Admission medications   Medication Sig Start Date End Date Taking? Authorizing Provider  acetylcysteine (MUCOMYST) 20 % nebulizer solution Take 4 mLs by nebulization 4 (four) times daily.  06/07/19  Yes [provider]  albuterol (PROVENTIL HFA;VENTOLIN HFA) 108 (90 Base) MCG/ACT inhaler Inhale 2 puffs into the lungs every 4 (four) hours as needed for wheezing or shortness of breath.   Yes [provider]  albuterol (PROVENTIL) (2.5 MG/3ML) 0.083% nebulizer solution Take 2.5 mg by nebulization 4 (four) times daily.   Yes [provider]  azithromycin (ZITHROMAX) 250 MG tablet Take 250 mg by mouth every Monday, Wednesday, and Friday. (at dinnertime)   Yes [provider]  budesonide-formoterol (SYMBICORT) 160-4.5 MCG/ACT inhaler Inhale 2 puffs into the lungs 2 (two) times daily.   Yes [provider]  cholecalciferol (VITAMIN D) 1000 units tablet Take 2,000 Units by mouth at bedtime.   Yes [provider]  colchicine 0.6 MG tablet Take 0.6-1.2 mg by mouth daily as needed (for gout flares).   Yes [provider]  diclofenac sodium (VOLTAREN) 1 % GEL Apply 2 g topically 4 (four) times daily as needed (pain).   Yes [provider]  docusate sodium (COLACE) 100 MG capsule Take 100 mg by mouth daily.    Yes [provider]  febuxostat (ULORIC) 40 MG tablet Take 40 mg by mouth daily. 05/01/19  Yes [provider]  Ferrous Sulfate (IRON) 28 MG TABS Take 28 mg by mouth  daily.    Yes [provider]  fluticasone (FLONASE) 50 MCG/ACT nasal spray Place 2 sprays into both nostrils daily.    Yes [provider]  ibandronate (BONIVA) 150 MG tablet Take 150 mg by mouth every 30 (thirty) days. 04/25/19  Yes [provider]  levothyroxine (SYNTHROID, LEVOTHROID) 75 MCG tablet Take 75 mcg by mouth daily before breakfast.    Yes [provider]  Magnesium 250 MG TABS Take 500 mg by mouth every evening.   Yes [provider]  potassium chloride (KLOR-CON) 10 MEQ tablet Take 10 mEq by mouth 2 (two) times daily. 06/05/19  Yes [provider]  predniSONE (DELTASONE) 10 MG tablet Take 10 mg by mouth daily.    Yes [provider]  rivaroxaban (XARELTO) 20 MG TABS tablet Take 20 mg by mouth daily with supper.    Yes [provider]  SPIRIVA RESPIMAT 2.5 MCG/ACT AERS Inhale 2 puffs into the lungs daily. 01/05/18  Yes [provider]  spironolactone (ALDACTONE) 50 MG tablet Take 75 mg by mouth daily.   Yes [provider]  torsemide (DEMADEX) 20 MG tablet Take 20 mg by mouth daily.   Yes [provider]    Physical Exam: Vitals:   06/26/19 1402 06/26/19 1404 06/26/19 1408 06/26/19 1652  BP: (!) 139/91   (!) 154/93  Pulse: (!) 101   75  Resp: 19   17  Temp: 99.1 F (37.3 C)     TempSrc: Oral     SpO2: 96%   97%  Weight:  96.2 kg 96.2 kg   Height:  5' 0.5" (1.537 m) 5' 1.5" (1.562 m)     General: alert and oriented to time, place, and person. Appear in mild distress, affect appropriate Eyes: PERRL, Conjunctiva normal ENT: Oral Mucosa Clear, dry  Neck: no JVD, no Abnormal Mass Or lumps Cardiovascular: S1 and S2 Present, no Murmur, peripheral pulses symmetrical Respiratory: good respiratory effort, Bilateral Air entry equal and Decreased, no signs of accessory muscle use, bilateral  Crackles, no wheezes Abdomen: Bowel Sound present, Soft and mild lower abdominal tenderness,  no hernia Skin: no rashes  Extremities: no Pedal edema, no calf tenderness Neurologic: without any new focal findings Gait not checked due to patient safety concerns  Data Reviewed: I have personally reviewed and interpreted labs, imaging as discussed below.  CBC: Recent Labs  Lab 06/26/19 1412  WBC 15.4*  HGB 17.0*  HCT 50.8*  MCV 85.2  PLT 269   Basic Metabolic Panel: Recent Labs  Lab 06/26/19 1412  NA 135  K 3.8  CL 92*  CO2 27  GLUCOSE 200*  BUN 28*  CREATININE 1.66*  CALCIUM 14.5*   GFR: Estimated Creatinine Clearance: 32.8 mL/min (A) (by C-G formula based on SCr of 1.66 mg/dL (H)). Liver Function Tests: Recent Labs  Lab 06/26/19 1412  AST 20  ALT 13  ALKPHOS 54  BILITOT 1.4*  PROT 8.1  ALBUMIN 5.0   Recent Labs  Lab 06/26/19 1412  LIPASE 25   No results for input(s): AMMONIA in the last 168 hours. Coagulation Profile: No results for input(s): INR, PROTIME in the last 168 hours. Cardiac Enzymes: No results for input(s): CKTOTAL, CKMB, CKMBINDEX, TROPONINI in the last 168 hours. BNP (last 3 results) No results for input(s): PROBNP in the last 8760 hours. HbA1C: No results for input(s): HGBA1C in the last 72 hours. CBG: No results for input(s): GLUCAP in the last 168 hours. Lipid Profile: No results for input(s): CHOL, HDL, LDLCALC, TRIG, CHOLHDL, LDLDIRECT in the last 72 hours. Thyroid Function Tests: No results for input(s): TSH, T4TOTAL, FREET4, T3FREE, THYROIDAB in the last 72 hours. Anemia Panel: No results for input(s): VITAMINB12, FOLATE, FERRITIN, TIBC, IRON, RETICCTPCT in the last 72 hours. Urine analysis:    Component Value Date/Time   COLORURINE YELLOW (A) 06/26/2019 1412   APPEARANCEUR HAZY (A) 06/26/2019 1412   APPEARANCEUR Hazy 12/25/2012 1653   LABSPEC 1.012 06/26/2019 1412   LABSPEC 1.021 12/25/2012 1653   PHURINE 5.0 06/26/2019 1412   GLUCOSEU NEGATIVE 06/26/2019 1412   GLUCOSEU Negative 12/25/2012 1653   HGBUR NEGATIVE  06/26/2019 1412   BILIRUBINUR NEGATIVE 06/26/2019 1412   BILIRUBINUR Negative 12/25/2012 1653   KETONESUR NEGATIVE 06/26/2019 1412   PROTEINUR NEGATIVE 06/26/2019 1412   NITRITE NEGATIVE 06/26/2019 1412   LEUKOCYTESUR TRACE (A) 06/26/2019 1412   LEUKOCYTESUR Negative 12/25/2012 1653    Radiological Exams on Admission: CT ABDOMEN PELVIS W CONTRAST  Result Date: 06/26/2019 CLINICAL DATA:  Acute lower abdominal pain and distention. EXAM: CT ABDOMEN AND PELVIS WITH CONTRAST TECHNIQUE: Multidetector CT imaging of the abdomen and pelvis was performed using the standard protocol following bolus administration of intravenous contrast. CONTRAST:  9mL OMNIPAQUE IOHEXOL 300 MG/ML  SOLN COMPARISON:  August 26, 2017. April 11th 2019. FINDINGS: Lower chest: No acute abnormality. Hepatobiliary: No biliary dilatation. Status post cholecystectomy. Stable hepatic cysts. Pancreas: Unremarkable. No pancreatic ductal dilatation or surrounding inflammatory changes. Spleen: Normal in size without focal abnormality. Adrenals/Urinary Tract: Adrenal glands appear normal. No hydronephrosis or renal obstruction is noted. No renal or ureteral calculi are noted. Urinary bladder is unremarkable. Left renal exophytic cyst is again noted. Stomach/Bowel: The stomach appears normal. There is no evidence of bowel obstruction. Diverticulosis of sigmoid colon is noted without inflammation. The appendix is not visualized. Vascular/Lymphatic: Aortic atherosclerosis. No enlarged abdominal or pelvic lymph nodes. Reproductive: Status post hysterectomy. No adnexal masses. Other: No abdominal wall hernia or abnormality. No abdominopelvic ascites. Musculoskeletal: No acute or significant osseous findings. IMPRESSION: 1. Diverticulosis of sigmoid colon without inflammation. 2. No acute abnormality seen in the abdomen or pelvis. Aortic Atherosclerosis (ICD10-I70.0). Electronically Signed   By: Marijo Conception M.D.   On: 06/26/2019 15:46     Echocardiogram: Last echocardiogram in [facility] system showed EF of 55%  I reviewed all nursing notes, pharmacy notes, vitals, pertinent old records.  Assessment/Plan 1.  Hypercalcemia. Acute kidney injury. Dehydration Presents with complaints of abdominal pain. Patient had poor p.o. intake and was taking her diuretics. Potentially had some GI issues for which the etiology has not been identified yet. With this she developed AKI and probably taking Tums on top of that caused her to have some hypercalcemia and worsening of her symptoms. At present we will gently hydrate her with IV fluids. Holding oral medications. Provide her with PPI. PTH and PTH RP as well as vitamin D levels are ordered. Further work-up depending on the response to the treatment.  Currently no malignancy identified so far.  2.  Abdominal pain. Constipation Presents with complaints of abdominal pain. Etiology not identified at possibly related to hypercalcemia. Will provide stool softener to treat hypercalcemia and monitor.  3.  History of asthma. History of COPD.  Chronic respiratory failure. Continue oxygen continue inhalers. Patient is on Dupixent Roflumilast, azithromycin as well as chronic prednisone which I will continue.  4.  History of HFpEF. Essential hypertension. Patient is on torsemide and Aldactone which I will be holding. Last EF shows peak 55% EF.  5.  Chronic A. fib. SP PPM implant. AV node ablation. Currently rate controlled.  Plan continue Xarelto.  6.  Hypothyroidism. Continue Synthroid.  Nutrition: Cardiac diet DVT Prophylaxis: Therapeutic Anticoagulation with Xarelto  Advance goals of care discussion: Full code   Consults: none  Family Communication: no family was present at bedside, at the time of interview.  Disposition: Admitted as inpatient, telemetry unit. Likely to be discharged home, in 3 days.  I have discussed plan of care as described above with RN and  patient/family.  Author: Berle Mull, MD [facility] 06/26/2019 6:14 PM   To reach On-call, see care teams to locate the attending and reach out to them via [email or web address]. If 7PM-7AM, please contact night-coverage If you still have difficulty reaching the attending provider, please page the [facility] (Director on Call) for [facility] on amion for assistance.

## 2019-06-26 NOTE — ED Triage Notes (Signed)
Pt in via [facility] with c/o abd distention, pain and weight loss.

## 2019-06-26 NOTE — ED Notes (Signed)
Pt given coke at this time.  

## 2019-06-26 NOTE — ED Provider Notes (Signed)
[facility] Emergency Department Provider Note    First MD Initiated Contact with Patient 06/26/19 1452     (approximate)  I have reviewed the triage vital signs and the nursing notes.   HISTORY  Chief Complaint Abdominal Pain    HPI Kathleen [name] is a 74 y.o. female with below listed past medical history on chronic 4 L nasal cannula presents the ER for several days of progressively worsening abdominal pain that is crampy in nature rating across the lower abdomen associated with increasing distention stomach epigastric discomfort nausea.  No chest pain.  Patient was sent to the ER after checking into urgent care for further evaluation.     She does admit to taking Tums over the past few days for epigastric discomfort.  States that they have been monitoring her parathyroid hormone in clinic.   Past Medical History:  Diagnosis Date  . Asthma   . Atypical atrial flutter ([place])   . 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[place])   . CHF (congestive heart failure) ([place])   . Chronic renal insufficiency   . 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 COPD (chronic obstructive pulmonary disease) ([place])   . Gout   . Hypertension   . Longstanding persistent atrial fibrillation ([place])   . Nonischemic cardiomyopathy ([place])   . Obesity   . Pacemaker   . Spontaneous pneumothorax 2013   Family History  Problem Relation Age of Onset  . Breast cancer Mother 92       3 different times  . Breast cancer Cousin    Past Surgical History:  Procedure Laterality Date  . ABDOMINAL HYSTERECTOMY    . ATRIAL FIBRILLATION ABLATION  07/20/2013   by Dr Westley Gambles  . AV nodal ablation  11/01/2013   by Dr Westley Gambles, repeated by Dr Dwana Curd  . BREAST BIOPSY Bilateral 1997   negative  . CARDIAC CATHETERIZATION    . CHOLECYSTECTOMY    . HERNIA REPAIR    . PACEMAKER INSERTION  06/2017   MDT Viva CRT-P implanted by Dr Westley Gambles after AV nodal ablation,  LV  lead could not be placed   Patient Active Problem List   Diagnosis Date Noted  . Hypercalcemia due to a drug 06/26/2019  . Chronic systolic heart failure ([place]) 04/06/2018  . HTN (hypertension) 04/06/2018  . COPD (chronic obstructive pulmonary disease) ([place]) 04/06/2018  . Atrial fibrillation ([place]) 04/06/2018  . Acute bronchitis 09/17/2015  . Malnutrition of moderate degree 09/11/2015  . Fever 09/08/2015  . Hyponatremia 09/08/2015      Prior to Admission medications   Medication Sig Start Date End Date Taking? Authorizing Provider  albuterol (PROVENTIL HFA;VENTOLIN HFA) 108 (90 Base) MCG/ACT inhaler Inhale 2 puffs into the lungs every 4 (four) hours as needed for wheezing or shortness of breath.    [provider]  albuterol (PROVENTIL) (2.5 MG/3ML) 0.083% nebulizer solution Take 3 mLs (2.5 mg total) by nebulization every 6 (six) hours as needed for wheezing or shortness of breath. 09/23/15   Gouru, Illene Silver, MD  azithromycin (ZITHROMAX) 250 MG tablet Take 250 mg by mouth at bedtime.     [provider]  budesonide (PULMICORT) 0.25 MG/2ML nebulizer solution Take 0.25 mg by nebulization 2 (two) times daily.  02/22/19   [provider]  budesonide-formoterol (SYMBICORT) 160-4.5 MCG/ACT inhaler Inhale 2 puffs into the lungs 2 (two) times daily.    [provider]  cholecalciferol (VITAMIN D) 1000 units tablet Take  2,000 Units by mouth at bedtime.    [provider]  colchicine 0.6 MG tablet Take 0.6-1.2 mg by mouth daily as needed (for gout flares).    [provider]  diclofenac sodium (VOLTAREN) 1 % GEL Apply 2 g topically 4 (four) times daily as needed (pain).    [provider]  docusate sodium (COLACE) 100 MG capsule Take 100 mg by mouth 2 (two) times daily.    [provider]  Ferrous Sulfate (IRON) 28 MG TABS Take 28 mg by mouth daily.     [provider]  fluticasone (FLONASE) 50 MCG/ACT nasal spray Place 1  spray into both nostrils daily.     [provider]  levothyroxine (SYNTHROID, LEVOTHROID) 75 MCG tablet Take 75 mcg by mouth daily before breakfast.     [provider]  losartan (COZAAR) 25 MG tablet Take 0.5 tablets (12.5 mg total) by mouth daily. 02/02/19 05/03/19  Delma Freeze, FNP  montelukast (SINGULAIR) 10 MG tablet Take 10 mg by mouth at bedtime.    [provider]  predniSONE (DELTASONE) 10 MG tablet Take 10-30 mg by mouth See admin instructions. Take 3 tablets for 5 days then take 2 tablets for 5 days then take 1 tablet for 5 days then stop    [provider]  probenecid (BENEMID) 500 MG tablet Take 500 mg by mouth 2 (two) times daily. 09/05/18 09/05/19  [provider]  rivaroxaban (XARELTO) 20 MG TABS tablet Take 20 mg by mouth daily with supper.     [provider]  SPIRIVA RESPIMAT 2.5 MCG/ACT AERS Inhale 2 puffs into the lungs daily. 01/05/18   [provider]  spironolactone (ALDACTONE) 50 MG tablet Take 75 mg by mouth daily.    [provider]  torsemide (DEMADEX) 20 MG tablet Take 20 mg by mouth daily.    [provider]  traMADol (ULTRAM) 50 MG tablet Take 50 mg by mouth 3 (three) times daily as needed for moderate pain.  02/24/19   [provider]    Allergies Allopurinol, Clindamycin, Pneumococcal 13-val conj vacc, Dronedarone, Entresto [sacubitril-valsartan], Fosamax [alendronate sodium], Meperidine, Penicillins, Rosuvastatin, Tetracycline, Adhesive [tape], and Lovastatin    Social History Social History   Tobacco Use  . Smoking status: Former Games developer  . Smokeless tobacco: Never Used  Substance Use Topics  . Alcohol use: No    Alcohol/week: 0.0 standard drinks  . Drug use: No    Review of Systems Patient denies headaches, rhinorrhea, blurry vision, numbness, shortness of breath, chest pain, edema, cough, abdominal pain, nausea, vomiting, diarrhea, dysuria, fevers, rashes or  hallucinations unless otherwise stated above in HPI. ____________________________________________   PHYSICAL EXAM:  VITAL SIGNS: Vitals:   06/26/19 1402  BP: (!) 139/91  Pulse: (!) 101  Resp: 19  Temp: 99.1 F (37.3 C)  SpO2: 96%    Constitutional: Alert and oriented.  Eyes: Conjunctivae are normal.  Head: Atraumatic. Nose: No congestion/rhinnorhea. Mouth/Throat: Mucous membranes are moist.   Neck: No stridor. Painless ROM.  Cardiovascular: Normal rate, regular rhythm. Grossly normal heart sounds.  Good peripheral circulation. Respiratory: Normal respiratory effort.  No retractions. Lungs CTAB. Gastrointestinal: Soft, + distention.  Mild epigastric ttp.   No abdominal bruits. No CVA tenderness. Genitourinary:  Musculoskeletal: No lower extremity tenderness nor edema.  No joint effusions. Neurologic:  Normal speech and language. No gross focal neurologic deficits are appreciated. No facial droop Skin:  Skin is warm, dry and intact. No rash noted. Psychiatric:  Mood and affect are normal. Speech and behavior are normal.  ____________________________________________   LABS (all labs ordered are listed, but only abnormal results are displayed)  Results for orders placed or performed during the hospital encounter of 06/26/19 (from the past 24 hour(s))  Lipase, blood     Status: None   Collection Time: 06/26/19  2:12 PM  Result Value Ref Range   Lipase 25 11 - 51 U/L  Comprehensive metabolic panel     Status: Abnormal   Collection Time: 06/26/19  2:12 PM  Result Value Ref Range   Sodium 135 135 - 145 mmol/L   Potassium 3.8 3.5 - 5.1 mmol/L   Chloride 92 (L) 98 - 111 mmol/L   CO2 27 22 - 32 mmol/L   Glucose, Bld 200 (H) 70 - 99 mg/dL   BUN 28 (H) 8 - 23 mg/dL   Creatinine, Ser 7.78 (H) 0.44 - 1.00 mg/dL   Calcium 24.2 (HH) 8.9 - 10.3 mg/dL   Total Protein 8.1 6.5 - 8.1 g/dL   Albumin 5.0 3.5 - 5.0 g/dL   AST 20 15 - 41 U/L   ALT 13 0 - 44 U/L   Alkaline Phosphatase 54  38 - 126 U/L   Total Bilirubin 1.4 (H) 0.3 - 1.2 mg/dL   GFR calc non Af Amer 30 (L) >60 mL/min   GFR calc Af Amer 35 (L) >60 mL/min   Anion gap 16 (H) 5 - 15  CBC     Status: Abnormal   Collection Time: 06/26/19  2:12 PM  Result Value Ref Range   WBC 15.4 (H) 4.0 - 10.5 K/uL   RBC 5.96 (H) 3.87 - 5.11 MIL/uL   Hemoglobin 17.0 (H) 12.0 - 15.0 g/dL   HCT 35.3 (H) 61.4 - 43.1 %   MCV 85.2 80.0 - 100.0 fL   MCH 28.5 26.0 - 34.0 pg   MCHC 33.5 30.0 - 36.0 g/dL   RDW 54.0 08.6 - 76.1 %   Platelets 269 150 - 400 K/uL   nRBC 0.0 0.0 - 0.2 %  Urinalysis, Complete w Microscopic     Status: Abnormal   Collection Time: 06/26/19  2:12 PM  Result Value Ref Range   Color, Urine YELLOW (A) YELLOW   APPearance HAZY (A) CLEAR   Specific Gravity, Urine 1.012 1.005 - 1.030   pH 5.0 5.0 - 8.0   Glucose, UA NEGATIVE NEGATIVE mg/dL   Hgb urine dipstick NEGATIVE NEGATIVE   Bilirubin Urine NEGATIVE NEGATIVE   Ketones, ur NEGATIVE NEGATIVE mg/dL   Protein, ur NEGATIVE NEGATIVE mg/dL   Nitrite NEGATIVE NEGATIVE   Leukocytes,Ua TRACE (A) NEGATIVE   RBC / HPF 0-5 0 - 5 RBC/hpf   WBC, UA 6-10 0 - 5 WBC/hpf   Bacteria, UA NONE SEEN NONE SEEN   Squamous Epithelial / LPF 0-5 0 - 5   Mucus PRESENT    Hyaline Casts, UA PRESENT    ____________________________________________  EKG_________________________  RADIOLOGY  I personally reviewed all radiographic images ordered to evaluate for the above acute complaints and reviewed radiology reports and findings.  These findings were personally discussed with the patient.  Please see medical record for radiology report.  ____________________________________________   PROCEDURES  Procedure(s) performed:  Procedures    Critical Care performed: no ____________________________________________   INITIAL IMPRESSION / ASSESSMENT AND PLAN / ED COURSE  Pertinent labs & imaging results that were available during my care of the patient were reviewed by me  and considered in  my medical decision making (see chart for details).   DDX: diverticulitis, enteritis, sbo, mass, aki, electrolyte abn  Kathleen [name] is a 74 y.o. who presents to the ED with symptoms as described above.  Patient with abdominal pain decreased p.o. intake.  Does have evidence of acute hypercalcemia.  CT imaging ordered to evaluate for her acute pain.  CT imaging without evidence of acute abnormality.  Given her poor p.o. intake with hypercalcemia I do feel she will require further medical management hospital continue IV fluids.  Have discussed with the patient and available family all diagnostics and treatments performed thus far and all questions were answered to the best of my ability. The patient demonstrates understanding and agreement with plan.      The patient was evaluated in Emergency Department today for the symptoms described in the history of present illness. He/she was evaluated in the context of the global COVID-19 pandemic, which necessitated consideration that the patient might be at risk for infection with the SARS-CoV-2 virus that causes COVID-19. Institutional protocols and algorithms that pertain to the evaluation of patients at risk for COVID-19 are in a state of rapid change based on information released by regulatory bodies including the CDC and federal and state organizations. These policies and algorithms were followed during the patient's care in the ED.  As part of my medical decision making, I reviewed the following data within the electronic MEDICAL RECORD NUMBER Nursing notes reviewed and incorporated, Labs reviewed, notes from prior ED visits and [place] Controlled Substance Database   ____________________________________________   FINAL CLINICAL IMPRESSION(S) / ED DIAGNOSES  Final diagnoses:  Generalized abdominal pain  Hypercalcemia      NEW MEDICATIONS STARTED DURING THIS VISIT:  New Prescriptions   No medications on file     Note:  This  document was prepared using Dragon voice recognition software and may include unintentional dictation errors.    Willy Eddy, MD 06/26/19 [phone]

## 2019-06-27 ENCOUNTER — Encounter: Payer: Self-pay | Admitting: Internal Medicine

## 2019-06-27 ENCOUNTER — Other Ambulatory Visit: Payer: Self-pay

## 2019-06-27 DIAGNOSIS — R1084 Generalized abdominal pain: Secondary | ICD-10-CM

## 2019-06-27 LAB — COMPREHENSIVE METABOLIC PANEL
ALT: 8 U/L (ref 0–44)
AST: 18 U/L (ref 15–41)
Albumin: 4 g/dL (ref 3.5–5.0)
Alkaline Phosphatase: 44 U/L (ref 38–126)
Anion gap: 14 (ref 5–15)
BUN: 32 mg/dL — ABNORMAL HIGH (ref 8–23)
CO2: 28 mmol/L (ref 22–32)
Calcium: 12.1 mg/dL — ABNORMAL HIGH (ref 8.9–10.3)
Chloride: 95 mmol/L — ABNORMAL LOW (ref 98–111)
Creatinine, Ser: 1.5 mg/dL — ABNORMAL HIGH (ref 0.44–1.00)
GFR calc Af Amer: 40 mL/min — ABNORMAL LOW (ref >60)
GFR calc non Af Amer: 34 mL/min — ABNORMAL LOW (ref >60)
Glucose, Bld: 151 mg/dL — ABNORMAL HIGH (ref 70–99)
Potassium: 3.4 mmol/L — ABNORMAL LOW (ref 3.5–5.1)
Sodium: 137 mmol/L (ref 135–145)
Total Bilirubin: 1.4 mg/dL — ABNORMAL HIGH (ref 0.3–1.2)
Total Protein: 6.7 g/dL (ref 6.5–8.1)

## 2019-06-27 LAB — CBC
HCT: 44.8 % (ref 36.0–46.0)
Hemoglobin: 14.7 g/dL (ref 12.0–15.0)
MCH: 28.6 pg (ref 26.0–34.0)
MCHC: 32.8 g/dL (ref 30.0–36.0)
MCV: 87.2 fL (ref 80.0–100.0)
Platelets: 215 10*3/uL (ref 150–400)
RBC: 5.14 MIL/uL — ABNORMAL HIGH (ref 3.87–5.11)
RDW: 14 % (ref 11.5–15.5)
WBC: 13.8 10*3/uL — ABNORMAL HIGH (ref 4.0–10.5)
nRBC: 0 % (ref 0.0–0.2)

## 2019-06-27 LAB — PHOSPHORUS: Phosphorus: 3.7 mg/dL (ref 2.5–4.6)

## 2019-06-27 LAB — PTH, INTACT AND CALCIUM
Calcium, Total (PTH): 14.3 mg/dL (ref 8.7–10.3)
PTH: 25 pg/mL (ref 15–65)

## 2019-06-27 LAB — MAGNESIUM: Magnesium: 2 mg/dL (ref 1.7–2.4)

## 2019-06-27 LAB — SARS CORONAVIRUS 2 (TAT 6-24 HRS): SARS Coronavirus 2: NEGATIVE

## 2019-06-27 LAB — TSH: TSH: 1.248 u[IU]/mL (ref 0.350–4.500)

## 2019-06-27 LAB — CALCITRIOL (1,25 DI-OH VIT D): Vit D, 1,25-Dihydroxy: 7 pg/mL — ABNORMAL LOW (ref 19.9–79.3)

## 2019-06-27 MED ORDER — BENZONATATE 100 MG PO CAPS
100.0000 mg | ORAL_CAPSULE | Freq: Three times a day (TID) | ORAL | Status: DC
  Administered 2019-06-27 – 2019-06-28 (×5): 100 mg via ORAL
  Filled 2019-06-27 (×5): qty 1

## 2019-06-27 MED ORDER — SIMETHICONE 80 MG PO CHEW
80.0000 mg | CHEWABLE_TABLET | Freq: Four times a day (QID) | ORAL | Status: DC
  Administered 2019-06-27 – 2019-06-28 (×4): 80 mg via ORAL
  Filled 2019-06-27 (×4): qty 1

## 2019-06-27 MED ORDER — HYDROCOD POLST-CPM POLST ER 10-8 MG/5ML PO SUER
5.0000 mL | Freq: Two times a day (BID) | ORAL | Status: DC | PRN
  Administered 2019-06-27: 5 mL via ORAL
  Filled 2019-06-27: qty 5

## 2019-06-27 MED ORDER — DOCUSATE SODIUM 100 MG PO CAPS
100.0000 mg | ORAL_CAPSULE | Freq: Two times a day (BID) | ORAL | Status: DC
  Administered 2019-06-27 – 2019-06-28 (×4): 100 mg via ORAL
  Filled 2019-06-27 (×4): qty 1

## 2019-06-27 MED ORDER — TORSEMIDE 20 MG PO TABS
20.0000 mg | ORAL_TABLET | Freq: Every day | ORAL | Status: DC
  Administered 2019-06-27 – 2019-06-28 (×2): 20 mg via ORAL
  Filled 2019-06-27 (×3): qty 1

## 2019-06-27 NOTE — Evaluation (Addendum)
Physical Therapy Evaluation Patient Details Name: Kathleen [name] MRN: [id number] DOB: 04-Apr-1946 Today's Date: 06/27/2019   History of Present Illness  74 y.o. 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  Pt has been having abdominal pain ~1 week, getting progressively weaker.  Clinical Impression  Pt did well with PT exam and though she was initially indicating she was too weak to do a lot she was ultimately able to ambulate >200 ft (on 4 then 2 L O2) w/o excessive fatigue and showed good ability to tolerate mobility, light exercises, balance testing and functional mobility w/o overt safety concerns.  Plan to trial steps next visit (pt apparently has taller than normal steps to enter home) - she had been having HHPT until recently since last admission.  Follow Up Recommendations Home health PT(pt may not need per continued improvement)    Equipment Recommendations  None recommended by PT    Recommendations for Other Services       Precautions / Restrictions Precautions Precautions: Fall Restrictions Weight Bearing Restrictions: No      Mobility  Bed Mobility Overal bed mobility: Independent             General bed mobility comments: Pt was able to get up to EOB easily and w/o assist, initially indicated she did not think she was going to do as much.  Transfers Overall transfer level: Independent Equipment used: Rolling walker (2 wheeled)             General transfer comment: Pt was able to rise to standing slowly, but w/o assist and w/o safety concerns.  Some reliance on walker initially with   Ambulation/Gait Ambulation/Gait assistance: Supervision Gait Distance (Feet): 250 Feet Assistive device: Rolling walker (2 wheeled)       General Gait Details: Pt with consistent but cautious ambulation initially.  Pt's O2 remained in the high 90s on 4L for first 100 ft, 2L for the remaineder of the effort  with sats remaining in the mid 90s and no excessive fatigue.  Pt was able to ambulate w/o AD for the final 30-40 ft but was much less steady and consistently reaching for (even if she didn't touch) walls, rails, etc.    Stairs            Wheelchair Mobility    Modified Rankin (Stroke Patients Only)       Balance Overall balance assessment: Mild deficits observed, not formally tested;Modified Independent                                           Pertinent Vitals/Pain Pain Assessment: No/denies pain    Home Living Family/patient expects to be discharged to:: Private residence Living Arrangements: Alone Available Help at Discharge: (has 2 sons that live within an hour, not a lot of assist) Type of Home: House Home Access: Stairs to enter Entrance Stairs-Rails: Left;Right(too far apart to use both, reports they are tall steps) Entrance Stairs-Number of Steps: 4   Home Equipment: Walker - 4 wheels;Cane - single point      Prior Function Level of Independence: Independent;Independent with assistive device(s)(pt reports she uses her cane PRN)         Comments: Pt has food delivered, drives/OOH minimally more due to Covid than limitaions     Hand Dominance  Extremity/Trunk Assessment   Upper Extremity Assessment Upper Extremity Assessment: Generalized weakness;Overall WFL for tasks assessed; h/o R RTC tear (non-repaired)    Lower Extremity Assessment Lower Extremity Assessment: Generalized weakness;Overall WFL for tasks assessed       Communication   Communication: No difficulties  Cognition Arousal/Alertness: Awake/alert Behavior During Therapy: WFL for tasks assessed/performed Overall Cognitive Status: Within Functional Limits for tasks assessed                                        General Comments      Exercises     Assessment/Plan    PT Assessment Patient needs continued PT services  PT Problem List  Decreased strength;Decreased range of motion;Decreased activity tolerance;Decreased balance;Decreased knowledge of use of DME;Decreased safety awareness;Decreased knowledge of precautions;Decreased mobility       PT Treatment Interventions DME instruction;Gait training;Stair training;Functional mobility training;Therapeutic activities;Therapeutic exercise;Balance training;Neuromuscular re-education;Patient/family education    PT Goals (Current goals can be found in the Care Plan section)  Acute Rehab PT Goals Patient Stated Goal: get feeling stronger and go home PT Goal Formulation: With patient Time For Goal Achievement: 07/11/19 Potential to Achieve Goals: Good    Frequency Min 2X/week   Barriers to discharge        Co-evaluation               AM-PAC PT "6 Clicks" Mobility  Outcome Measure Help needed turning from your back to your side while in a flat bed without using bedrails?: None Help needed moving from lying on your back to sitting on the side of a flat bed without using bedrails?: None Help needed moving to and from a bed to a chair (including a wheelchair)?: None Help needed standing up from a chair using your arms (e.g., wheelchair or bedside chair)?: None Help needed to walk in hospital room?: A Little Help needed climbing 3-5 steps with a railing? : A Little 6 Click Score: 22    End of Session Equipment Utilized During Treatment: Gait belt;Oxygen(2-4 L) Activity Tolerance: Patient tolerated treatment well Patient left: with chair alarm set;with call bell/phone within reach Nurse Communication: Mobility status(O2 staying in the 90s on 2L during activity) PT Visit Diagnosis: Muscle weakness (generalized) (M62.81);Difficulty in walking, not elsewhere classified (R26.2)    Time: 1445-1520 PT Time Calculation (min) (ACUTE ONLY): 35 min   Charges:   PT Evaluation $PT Eval Low Complexity: 1 Low PT Treatments $Gait Training: 8-22 mins $Therapeutic Activity:  8-22 mins        Malachi Pro, DPT 06/27/2019, 5:09 PM

## 2019-06-27 NOTE — Progress Notes (Signed)
[facility] Progress Note  Patient: Kathleen [name]    WFU:[id number]  DOA: 06/26/2019     Date of Service: the patient was seen and examined on 06/27/2019  Chief Complaint  Patient presents with  . Abdominal Pain   Brief hospital course: Kathleen [name] is a 74 y.o. 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 Patient presents with complaints of abdominal pain ongoing since 06/16/2018. Patient started having severe abdominal pain located in her lower abdomen which was intermittent in nature. As the day progressed of abdominal pain progressively becoming more persistent with dull achiness She also started having nausea without any vomiting. She started feeling tired and fatigued and felt that she was actually dehydrated. She started noticing that her urine output was also less. She denies any dizziness or lightheadedness, no diarrhea or actually constipation. She denies any fever or chills denies any cough or worsening of her chronic shortness of breath from her asthma and COPD. She mentions she was compliant with all her medications. Last Friday she went to pharmacy and started taking some Tums. Since Friday so far she has taken 3-6 Tums on a daily basis. She denies any recent change in her medication. Patient gets her parathyroid levels checked on a yearly basis  Currently further plan is continue IV hydration.  Assessment and Plan: 1.  Hypercalcemia. Acute kidney injury. Dehydration Presents with complaints of abdominal pain. Patient had poor p.o. intake and was taking her diuretics. Potentially had some GI issues for which the etiology has not been identified yet. With this she developed AKI and probably taking Tums on top of that caused her to have some hypercalcemia and worsening of her symptoms. At present we will gently hydrate her with IV fluids. Holding oral medications. Provide her with PPI. PTH and PTH  RP as well as vitamin D levels are ordered. Nephrology consulted. Appreciate assistance. Recommend continuing currently IV fluids.  No indication for calcitonin or Zometa since it is improving.  2.  Abdominal pain. Constipation Presents with complaints of abdominal pain. Etiology not identified at possibly related to hypercalcemia. Will provide stool softener to treat hypercalcemia and monitor.  3.  History of asthma. History of COPD.  Chronic respiratory failure. Continue oxygen continue inhalers. Patient is on Dupixent Roflumilast, azithromycin as well as chronic prednisone which I will continue. Patient does not have COVID-19 infection.  Patient is not a PUI.  4.  History of HFpEF. Essential hypertension. Patient is on torsemide and Aldactone which I will be holding. Last EF shows peak 55% EF.  5.  Chronic A. fib. SP PPM implant. AV node ablation. Currently rate controlled.  Plan continue Xarelto.  6.  Hypothyroidism. Continue Synthroid.  Diet: Cardiac diet DVT Prophylaxis: Therapeutic Anticoagulation with Xarelto   Advance goals of care discussion: Full code  Family Communication: no family was present at bedside, at the time of interview.   Disposition:  Pt is from home, admitted with hypercalcemia, still has hypercalcemia and fatigue and tiredness with poor p.o. intake, which precludes a safe discharge. Discharge to home, when calcium improves and oral intake improves.  Subjective: Continues to have abdominal pain.  Reports nausea but no vomiting.  No BM.  No fever no chills.  Physical Exam: General:  alert oriented to time, place, and person.  Appear in moderate distress, affect appropriate Eyes: PERRL ENT: Oral Mucosa Clear, moist  Neck: no JVD,  Cardiovascular: S1 and S2  Present, no Murmur,  Respiratory: increased respiratory effort, Bilateral Air entry equal and Decreased, bilateral  Crackles, no wheezes Abdomen: Bowel Sound present, Soft and no  tenderness,  Skin: no rash Extremities: no Pedal edema, no calf tenderness Neurologic: without any new focal findings  Gait not checked due to patient safety concerns  Vitals:   06/27/19 0430 06/27/19 0500 06/27/19 0530 06/27/19 0904  BP: (!) 169/72 (!) 150/79 (!) 148/79 135/63  Pulse: 69 70 69 73  Resp: 18 17 18 20   Temp:    (!) 97.5 F (36.4 C)  TempSrc:    Oral  SpO2: 100% 99% 98% 100%  Weight:      Height:        Intake/Output Summary (Last 24 hours) at 06/27/2019 1934 Last data filed at 06/27/2019 1629 Gross per 24 hour  Intake 1699.77 ml  Output 400 ml  Net 1299.77 ml   Filed Weights   06/26/19 1404 06/26/19 1408  Weight: 96.2 kg 96.2 kg    Data Reviewed: I have personally reviewed and interpreted daily labs, tele strips, imagings as discussed above. I reviewed all nursing notes, pharmacy notes, vitals, pertinent old records I have discussed plan of care as described above with RN and patient/family.  CBC: Recent Labs  Lab 06/26/19 1412 06/27/19 0417  WBC 15.4* 13.8*  HGB 17.0* 14.7  HCT 50.8* 44.8  MCV 85.2 87.2  PLT 269 215   Basic Metabolic Panel: Recent Labs  Lab 06/26/19 1412 06/26/19 1618 06/27/19 0417  NA 135  --  137  K 3.8  --  3.4*  CL 92*  --  95*  CO2 27  --  28  GLUCOSE 200*  --  151*  BUN 28*  --  32*  CREATININE 1.66*  --  1.50*  CALCIUM 14.5* 14.3* 12.1*  MG  --   --  2.0  PHOS  --   --  3.7    Studies: No results found.  Scheduled Meds: . azithromycin  250 mg Oral Q M,W,F  . benzonatate  100 mg Oral TID  . docusate sodium  100 mg Oral BID  . fluticasone  1 spray Each Nare Daily  . guaiFENesin  600 mg Oral BID  . levothyroxine  75 mcg Oral Q0600  . mometasone-formoterol  2 puff Inhalation BID  . polyethylene glycol  17 g Oral Daily  . predniSONE  10 mg Oral Daily  . rivaroxaban  20 mg Oral Q supper  . senna-docusate  2 tablet Oral BID  . simethicone  80 mg Oral QID  . sodium chloride flush  3 mL Intravenous Once  .  tiotropium  1 capsule Inhalation Daily  . torsemide  20 mg Oral Daily   Continuous Infusions: . sodium chloride 100 mL/hr at 06/27/19 1629   PRN Meds: acetaminophen **OR** acetaminophen, albuterol, bisacodyl, chlorpheniramine-HYDROcodone, ondansetron **OR** ondansetron (ZOFRAN) IV, oxyCODONE  Time spent: 35 minutes  Author: 06/29/19, MD [facility] 06/27/2019 7:34 PM  To reach On-call, see care teams to locate the attending and reach out to them via [email or web address]. If 7PM-7AM, please contact night-coverage If you still have difficulty reaching the attending provider, please page the [facility] (Director on Call) for [facility] on amion for assistance.

## 2019-06-27 NOTE — ED Notes (Signed)
NP at bedside.

## 2019-06-27 NOTE — Consult Note (Signed)
[address] [place], [place] [address] Phone [phone]. Fax [phone]  Date: 06/27/2019                  Patient Name:  Kathleen [name]  MRN: [id number]  DOB: 11/19/1945  Age / Sex: 74 y.o., female         PCP: Marguarite Arbour, MD                 Service Requesting Consult: IM/ Rolly Salter, MD                 Reason for Consult: Hypercalcemia            History of Present Illness: Patient is a 74 y.o. female  admitted to [facility] on 06/26/2019 for evaluation of abdominal pain.  Patient reports abdominal pain for past 10 days States abdominal discomfort started last Saturday.  This past weekend on Friday, it was worse therefore they consulted outpatient pharmacist who recommended trying Gas-X and Tums for acid control.  Patient reports poor appetite but did take Tums regularly.  When abdominal pain got worse, she came to the hospital for evaluation. Evaluation so far, found that her creatinine had increased to 1.66, calcium was elevated at 14.5.  Baseline creatinine of 0.8 from December 28 noted in care everywhere records At present, her main complaint is excessive coughing She is able to eat without nausea or vomiting No leg edema  Medications: Outpatient medications: Medications Prior to Admission  Medication Sig Dispense Refill Last Dose  . acetylcysteine (MUCOMYST) 20 % nebulizer solution Take 4 mLs by nebulization 4 (four) times daily.    Past Week at Unknown time  . albuterol (PROVENTIL HFA;VENTOLIN HFA) 108 (90 Base) MCG/ACT inhaler Inhale 2 puffs into the lungs every 4 (four) hours as needed for wheezing or shortness of breath.   Unknown at PRN  . albuterol (PROVENTIL) (2.5 MG/3ML) 0.083% nebulizer solution Take 2.5 mg by nebulization 4 (four) times daily.   Past Week at Unknown time  . azithromycin (ZITHROMAX) 250 MG tablet Take 250 mg by mouth every Monday, Wednesday, and Friday. (at dinnertime)   06/23/2019 at 1800  . budesonide-formoterol (SYMBICORT)  160-4.5 MCG/ACT inhaler Inhale 2 puffs into the lungs 2 (two) times daily.   06/26/2019 at 0900  . cholecalciferol (VITAMIN D) 1000 units tablet Take 2,000 Units by mouth at bedtime.     . colchicine 0.6 MG tablet Take 0.6-1.2 mg by mouth daily as needed (for gout flares).   Unknown at PRN  . diclofenac sodium (VOLTAREN) 1 % GEL Apply 2 g topically 4 (four) times daily as needed (pain).   Unknown at PRN  . docusate sodium (COLACE) 100 MG capsule Take 100 mg by mouth daily.    06/26/2019 at 0900  . febuxostat (ULORIC) 40 MG tablet Take 40 mg by mouth daily.   06/26/2019 at 1000  . Ferrous Sulfate (IRON) 28 MG TABS Take 28 mg by mouth daily.      . fluticasone (FLONASE) 50 MCG/ACT nasal spray Place 2 sprays into both nostrils daily.    Past Week at Unknown time  . ibandronate (BONIVA) 150 MG tablet Take 150 mg by mouth every 30 (thirty) days.   As directed at Unknown  . levothyroxine (SYNTHROID, LEVOTHROID) 75 MCG tablet Take 75 mcg by mouth daily before breakfast.   2 06/26/2019 at 0900  . Magnesium 250 MG TABS Take 500 mg by mouth every evening.   06/25/2019 at Unknown  time  . potassium chloride (KLOR-CON) 10 MEQ tablet Take 10 mEq by mouth 2 (two) times daily.   06/26/2019 at 0900  . predniSONE (DELTASONE) 10 MG tablet Take 10 mg by mouth daily.    06/26/2019 at 0900  . rivaroxaban (XARELTO) 20 MG TABS tablet Take 20 mg by mouth daily with supper.    06/25/2019 at 1800  . SPIRIVA RESPIMAT 2.5 MCG/ACT AERS Inhale 2 puffs into the lungs daily.  11 06/26/2019 at 0900  . spironolactone (ALDACTONE) 50 MG tablet Take 75 mg by mouth daily.   06/26/2019 at 0900  . torsemide (DEMADEX) 20 MG tablet Take 20 mg by mouth daily.   06/26/2019 at 0900    Current medications: Current Facility-Administered Medications  Medication Dose Route Frequency Provider Last Rate Last Admin  . 0.9 %  sodium chloride infusion   Intravenous Continuous Rolly Salter, MD 100 mL/hr at 06/26/19 2030 New Bag at 06/26/19 2030  .  acetaminophen (TYLENOL) tablet 650 mg  650 mg Oral Q6H PRN Rolly Salter, MD       Or  . acetaminophen (TYLENOL) suppository 650 mg  650 mg Rectal Q6H PRN Rolly Salter, MD      . albuterol (VENTOLIN HFA) 108 (90 Base) MCG/ACT inhaler 2 puff  2 puff Inhalation Q4H PRN Rolly Salter, MD      . azithromycin [facility]) tablet 250 mg  250 mg Oral Q M,W,F Rolly Salter, MD   250 mg at 06/26/19 2037  . benzonatate (TESSALON) capsule 100 mg  100 mg Oral TID Rolly Salter, MD      . bisacodyl (DULCOLAX) suppository 10 mg  10 mg Rectal Daily PRN Rolly Salter, MD      . chlorpheniramine-HYDROcodone (TUSSIONEX) 10-8 MG/5ML suspension 5 mL  5 mL Oral Q12H PRN Rolly Salter, MD      . docusate sodium (COLACE) capsule 100 mg  100 mg Oral BID Manuela Schwartz, NP   100 mg at 06/27/19 0408  . fluticasone (FLONASE) 50 MCG/ACT nasal spray 1 spray  1 spray Each Nare Daily Rolly Salter, MD   1 spray at 06/27/19 0912  . guaiFENesin (MUCINEX) 12 hr tablet 600 mg  600 mg Oral BID Rolly Salter, MD   600 mg at 06/26/19 2316  . levothyroxine (SYNTHROID) tablet 75 mcg  75 mcg Oral Q0600 Rolly Salter, MD      . mometasone-formoterol [facility]) 200-5 MCG/ACT inhaler 2 puff  2 puff Inhalation BID Rolly Salter, MD   2 puff at 06/27/19 0908  . ondansetron (ZOFRAN) tablet 4 mg  4 mg Oral Q6H PRN Rolly Salter, MD       Or  . ondansetron [facility]) injection 4 mg  4 mg Intravenous Q6H PRN Rolly Salter, MD      . oxyCODONE (Oxy IR/ROXICODONE) immediate release tablet 5 mg  5 mg Oral Q4H PRN Rolly Salter, MD      . polyethylene glycol (MIRALAX / GLYCOLAX) packet 17 g  17 g Oral Daily Rolly Salter, MD      . predniSONE (DELTASONE) tablet 10 mg  10 mg Oral Daily Rolly Salter, MD      . rivaroxaban Carlena Hurl) tablet 20 mg  20 mg Oral Q supper Rolly Salter, MD   20 mg at 06/26/19 2034  . senna-docusate (Senokot-S) tablet 2 tablet  2 tablet Oral BID Rolly Salter, MD   2 tablet at  06/26/19 2316   . sodium chloride flush (NS) 0.9 % injection 3 mL  3 mL Intravenous Once Willy Eddy, MD      . tiotropium [facility]) inhalation capsule [facility] use ONLY) 18 mcg  1 capsule Inhalation Daily Rolly Salter, MD   18 mcg at 06/27/19 0908  . torsemide (DEMADEX) tablet 20 mg  20 mg Oral Daily Rolly Salter, MD          Allergies: Allergies  Allergen Reactions  . Allopurinol Other (See Comments)    Reaction:  Dizziness   . Clindamycin Anaphylaxis and Hives  . Pneumococcal 13-Val Conj Vacc Itching, Swelling and Rash  . Dronedarone Rash  . Entresto [Sacubitril-Valsartan] Other (See Comments)    hypotension  . Fosamax [Alendronate Sodium] Nausea Only  . Meperidine Nausea And Vomiting  . Penicillins Hives and Other (See Comments)    Has patient had a PCN reaction causing immediate rash, facial/tongue/throat swelling, SOB or lightheadedness with hypotension: No Has patient had a PCN reaction causing severe rash involving mucus membranes or skin necrosis: No Has patient had a PCN reaction that required hospitalization No Has patient had a PCN reaction occurring within the last 10 years: No If all of the above answers are "NO", then may proceed with Cephalosporin use.  . Rosuvastatin Other (See Comments)    Reaction:  Muscle spasms   . Tetracycline Hives  . Adhesive [Tape] Rash  . Lovastatin Rash    Other reaction(s): Muscle Pain      Past Medical History: Past Medical History:  Diagnosis Date  . Asthma   . Atypical atrial flutter (HCC)   . 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HCC)   . CHF (congestive heart failure) (HCC)   . Chronic renal insufficiency   . 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 COPD (chronic obstructive pulmonary disease) (HCC)   . Gout   . Hypertension   . Longstanding persistent atrial fibrillation (HCC)   . Nonischemic cardiomyopathy (HCC)   . Obesity   . Pacemaker   . Spontaneous pneumothorax 2013     Past  Surgical History: Past Surgical History:  Procedure Laterality Date  . ABDOMINAL HYSTERECTOMY    . ATRIAL FIBRILLATION ABLATION  07/20/2013   by Dr Christin Fudge  . AV nodal ablation  11/01/2013   by Dr Christin Fudge, repeated by Dr Wilford Grist  . BREAST BIOPSY Bilateral 1997   negative  . CARDIAC CATHETERIZATION    . CHOLECYSTECTOMY    . HERNIA REPAIR    . PACEMAKER INSERTION  06/2017   MDT Viva CRT-P implanted by Dr Christin Fudge after AV nodal ablation,  LV lead could not be placed     Family History: Family History  Problem Relation Age of Onset  . Breast cancer Mother 18       3 different times  . Breast cancer Cousin      Social History: Social History   Socioeconomic History  . Marital status: Widowed    Spouse name: Not on file  . Number of children: Not on file  . Years of education: Not on file  . Highest education level: Not on file  Occupational History  . Occupation: retired  Tobacco Use  . Smoking status: Former Games developer  . Smokeless tobacco: Never Used  Substance and Sexual Activity  . Alcohol use: No    Alcohol/week: 0.0 standard drinks  . Drug use: No  . Sexual activity: Not Currently  Other Topics Concern  .  Not on file  Social History Narrative   Pt lives in [place] alone.  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  Her son works in [employer].   Social Determinants of Health   Financial Resource Strain: Low Risk   . Difficulty of Paying Living Expenses: Not hard at all  Food Insecurity: No Food Insecurity  . Worried About Programme researcher, broadcasting/film/video in the Last Year: Never true  . Ran Out of Food in the Last Year: Never true  Transportation Needs: No Transportation Needs  . Lack of Transportation (Medical): No  . Lack of Transportation (Non-Medical): No  Physical Activity: Insufficiently Active  . Days of Exercise per Week: 2 days  . Minutes of Exercise per Session: 20 min  Stress: Stress Concern Present  . Feeling of Stress : Rather much  Social  Connections: Severely Isolated  . Frequency of Communication with Friends and Family: Once a week  . Frequency of Social Gatherings with Friends and Family: Once a week  . Attends Religious Services: Never  . Active Member of Clubs or Organizations: No  . Attends Banker Meetings: Never  . Marital Status: Widowed  Intimate Partner Violence: Not At Risk  . Fear of Current or Ex-Partner: No  . Emotionally Abused: No  . Physically Abused: No  . Sexually Abused: No     Review of Systems: Gen: Low-grade fever at home HEENT: Denies any vision or hearing complaints CV: No chest pain or shortness of breath at present Resp: Excessive cough.  No hemoptysis.  Has underlying COPD.  Requires 4 L oxygen by nasal cannula at baseline GI: Appetite is good today.  Had nausea and abdominal pain at home GU : Denies any problem with voiding.  No blood in the urine MS: Denies any bones or joints pain Derm:    No complaints Psych: No complaints Heme: No complaints Neuro: No complaints Endocrine.  Follows with Dr. Gershon Crane as outpatient for primary hyperparathyroidism  Vital Signs: Blood pressure 135/63, pulse 73, temperature (!) 97.5 F (36.4 C), temperature source Oral, resp. rate 20, height 5' 1.5" (1.562 m), weight 96.2 kg, SpO2 100 %.  No intake or output data in the 24 hours ending 06/27/19 0921  Weight trends: Filed Weights   06/26/19 1404 06/26/19 1408  Weight: 96.2 kg 96.2 kg    Physical Exam: General:  No acute distress, laying in the bed  HEENT  anicteric, moist oral mucous membrane  Lungs:  Coarse breath sounds bilaterally, excessive coughing  Heart::  Regular, no rub  Abdomen:  Soft, nontender  Extremities:  No peripheral edema  Neurologic:  Alert, able to answer questions appropriately  Skin:  No acute rashes    Lab results: Basic Metabolic Panel: Recent Labs  Lab 06/26/19 1412 06/26/19 1618 06/27/19 0417  NA 135  --  137  K 3.8  --  3.4*  CL 92*  --   95*  CO2 27  --  28  GLUCOSE 200*  --  151*  BUN 28*  --  32*  CREATININE 1.66*  --  1.50*  CALCIUM 14.5* 14.3* 12.1*  MG  --   --  2.0  PHOS  --   --  3.7    Liver Function Tests: Recent Labs  Lab 06/27/19 0417  AST 18  ALT 8  ALKPHOS 44  BILITOT 1.4*  PROT 6.7  ALBUMIN 4.0   Recent Labs  Lab 06/26/19 1412  LIPASE 25  No results for input(s): AMMONIA in the last 168 hours.  CBC: Recent Labs  Lab 06/26/19 1412 06/27/19 0417  WBC 15.4* 13.8*  HGB 17.0* 14.7  HCT 50.8* 44.8  MCV 85.2 87.2  PLT 269 215    Cardiac Enzymes: No results for input(s): CKTOTAL, TROPONINI in the last 168 hours.  BNP: Invalid input(s): POCBNP  CBG: No results for input(s): GLUCAP in the last 168 hours.  Microbiology: Recent Results (from the past 720 hour(s))  SARS CORONAVIRUS 2 (TAT 6-24 HRS) Nasopharyngeal Nasopharyngeal Swab     Status: None   Collection Time: 06/26/19  6:56 PM   Specimen: Nasopharyngeal Swab  Result Value Ref Range Status   SARS Coronavirus 2 NEGATIVE NEGATIVE Final    Comment: (NOTE) SARS-CoV-2 target nucleic acids are NOT DETECTED. The SARS-CoV-2 RNA is generally detectable in upper and lower respiratory specimens during the acute phase of infection. Negative results do not preclude SARS-CoV-2 infection, do not rule out co-infections with other pathogens, and should not be used as the sole basis for treatment or other patient management decisions. Negative results must be combined with clinical observations, patient history, and epidemiological information. The expected result is Negative. Fact Sheet for Patients: HairSlick.no Fact Sheet for Healthcare Providers: quierodirigir.com This test is not yet approved or cleared by the [place] FDA and  has been authorized for detection and/or diagnosis of SARS-CoV-2 by FDA under an Emergency Use Authorization (EUA). This EUA will remain  in effect  (meaning this test can be used) for the duration of the COVID-19 declaration under Section 56 4(b)(1) of the Act, 21 U.S.C. section 360bbb-3(b)(1), unless the authorization is terminated or revoked sooner. Performed at [facility], [address], [place], [place] [address]      Coagulation Studies: No results for input(s): LABPROT, INR in the last 72 hours.  Urinalysis: Recent Labs    06/26/19 1412  COLORURINE YELLOW*  LABSPEC 1.012  PHURINE 5.0  GLUCOSEU NEGATIVE  HGBUR NEGATIVE  BILIRUBINUR NEGATIVE  KETONESUR NEGATIVE  PROTEINUR NEGATIVE  NITRITE NEGATIVE  LEUKOCYTESUR TRACE*        Imaging: CT ABDOMEN PELVIS W CONTRAST  Result Date: 06/26/2019 CLINICAL DATA:  Acute lower abdominal pain and distention. EXAM: CT ABDOMEN AND PELVIS WITH CONTRAST TECHNIQUE: Multidetector CT imaging of the abdomen and pelvis was performed using the standard protocol following bolus administration of intravenous contrast. CONTRAST:  81mL OMNIPAQUE IOHEXOL 300 MG/ML  SOLN COMPARISON:  August 26, 2017. April 11th 2019. FINDINGS: Lower chest: No acute abnormality. Hepatobiliary: No biliary dilatation. Status post cholecystectomy. Stable hepatic cysts. Pancreas: Unremarkable. No pancreatic ductal dilatation or surrounding inflammatory changes. Spleen: Normal in size without focal abnormality. Adrenals/Urinary Tract: Adrenal glands appear normal. No hydronephrosis or renal obstruction is noted. No renal or ureteral calculi are noted. Urinary bladder is unremarkable. Left renal exophytic cyst is again noted. Stomach/Bowel: The stomach appears normal. There is no evidence of bowel obstruction. Diverticulosis of sigmoid colon is noted without inflammation. The appendix is not visualized. Vascular/Lymphatic: Aortic atherosclerosis. No enlarged abdominal or pelvic lymph nodes. Reproductive: Status post hysterectomy. No adnexal masses. Other: No abdominal wall hernia or abnormality. No abdominopelvic  ascites. Musculoskeletal: No acute or significant osseous findings. IMPRESSION: 1. Diverticulosis of sigmoid colon without inflammation. 2. No acute abnormality seen in the abdomen or pelvis. Aortic Atherosclerosis (ICD10-I70.0). Electronically Signed   By: Lupita Raider M.D.   On: 06/26/2019 15:46      Assessment & Plan: Pt is a 73  y.o. 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 intake, acute kidney injury, primary hyperparathyroidism Calcium level is improving with IV hydration + Forced diuresis with Torsemide Continue to monitor closely  #Acute kidney injury Multifactorial including volume depletion, IV contrast exposure for CT abdomen pelvis on January 18 Conservative management.  Serum creatinine has declined from 1.66-1.50 today Baseline creatinine of 0.80 from December 28 and outpatient records      LOS: 1 Emrah Ariola 1/19/20219:21 AM    Note: This note was prepared with Dragon dictation. Any transcription errors are unintentional

## 2019-06-28 DIAGNOSIS — N179 Acute kidney failure, unspecified: Secondary | ICD-10-CM

## 2019-06-28 DIAGNOSIS — I4821 Permanent atrial fibrillation: Secondary | ICD-10-CM

## 2019-06-28 DIAGNOSIS — I5032 Chronic diastolic (congestive) heart failure: Secondary | ICD-10-CM

## 2019-06-28 DIAGNOSIS — N189 Chronic kidney disease, unspecified: Secondary | ICD-10-CM

## 2019-06-28 DIAGNOSIS — R1084 Generalized abdominal pain: Secondary | ICD-10-CM

## 2019-06-28 DIAGNOSIS — I10 Essential (primary) hypertension: Secondary | ICD-10-CM

## 2019-06-28 DIAGNOSIS — K5900 Constipation, unspecified: Secondary | ICD-10-CM

## 2019-06-28 LAB — COMPREHENSIVE METABOLIC PANEL
ALT: 10 U/L (ref 0–44)
AST: 13 U/L — ABNORMAL LOW (ref 15–41)
Albumin: 3.4 g/dL — ABNORMAL LOW (ref 3.5–5.0)
Alkaline Phosphatase: 35 U/L — ABNORMAL LOW (ref 38–126)
Anion gap: 9 (ref 5–15)
BUN: 28 mg/dL — ABNORMAL HIGH (ref 8–23)
CO2: 30 mmol/L (ref 22–32)
Calcium: 9.6 mg/dL (ref 8.9–10.3)
Chloride: 100 mmol/L (ref 98–111)
Creatinine, Ser: 1.1 mg/dL — ABNORMAL HIGH (ref 0.44–1.00)
GFR calc Af Amer: 58 mL/min — ABNORMAL LOW (ref >60)
GFR calc non Af Amer: 50 mL/min — ABNORMAL LOW (ref >60)
Glucose, Bld: 134 mg/dL — ABNORMAL HIGH (ref 70–99)
Potassium: 3.1 mmol/L — ABNORMAL LOW (ref 3.5–5.1)
Sodium: 139 mmol/L (ref 135–145)
Total Bilirubin: 0.6 mg/dL (ref 0.3–1.2)
Total Protein: 5.5 g/dL — ABNORMAL LOW (ref 6.5–8.1)

## 2019-06-28 LAB — CBC WITH DIFFERENTIAL/PLATELET
Abs Immature Granulocytes: 0.13 10*3/uL — ABNORMAL HIGH (ref 0.00–0.07)
Basophils Absolute: 0.1 10*3/uL (ref 0.0–0.1)
Basophils Relative: 1 %
Eosinophils Absolute: 0.1 10*3/uL (ref 0.0–0.5)
Eosinophils Relative: 1 %
HCT: 36.9 % (ref 36.0–46.0)
Hemoglobin: 12.3 g/dL (ref 12.0–15.0)
Immature Granulocytes: 2 %
Lymphocytes Relative: 17 %
Lymphs Abs: 1.3 10*3/uL (ref 0.7–4.0)
MCH: 28.8 pg (ref 26.0–34.0)
MCHC: 33.3 g/dL (ref 30.0–36.0)
MCV: 86.4 fL (ref 80.0–100.0)
Monocytes Absolute: 0.6 10*3/uL (ref 0.1–1.0)
Monocytes Relative: 7 %
Neutro Abs: 5.7 10*3/uL (ref 1.7–7.7)
Neutrophils Relative %: 72 %
Platelets: 148 10*3/uL — ABNORMAL LOW (ref 150–400)
RBC: 4.27 MIL/uL (ref 3.87–5.11)
RDW: 13.5 % (ref 11.5–15.5)
WBC: 8 10*3/uL (ref 4.0–10.5)
nRBC: 0 % (ref 0.0–0.2)

## 2019-06-28 LAB — PROCALCITONIN: Procalcitonin: <0.1 ng/mL

## 2019-06-28 LAB — C-REACTIVE PROTEIN: CRP: <0.5 mg/dL (ref <1.0)

## 2019-06-28 LAB — MAGNESIUM: Magnesium: 1.9 mg/dL (ref 1.7–2.4)

## 2019-06-28 MED ORDER — LACTULOSE 10 GM/15ML PO SOLN
30.0000 g | ORAL | Status: AC
  Administered 2019-06-28: 30 g via ORAL
  Filled 2019-06-28: qty 60

## 2019-06-28 MED ORDER — POTASSIUM CHLORIDE CRYS ER 20 MEQ PO TBCR
40.0000 meq | EXTENDED_RELEASE_TABLET | Freq: Once | ORAL | Status: AC
  Administered 2019-06-28: 40 meq via ORAL
  Filled 2019-06-28: qty 2

## 2019-06-28 MED ORDER — SIMETHICONE 80 MG PO CHEW: 80.0000 mg | CHEWABLE_TABLET | Freq: Four times a day (QID) | ORAL | 0 refills | Status: DC

## 2019-06-28 MED ORDER — COLCHICINE 0.6 MG PO TABS: 0.6000 mg | ORAL_TABLET | Freq: Every day | ORAL | Status: DC | PRN

## 2019-06-28 MED ORDER — PANTOPRAZOLE SODIUM 40 MG PO TBEC
40.0000 mg | DELAYED_RELEASE_TABLET | Freq: Every day | ORAL | Status: DC
  Administered 2019-06-28: 40 mg via ORAL
  Filled 2019-06-28: qty 1

## 2019-06-28 MED ORDER — POLYETHYLENE GLYCOL 3350 17 G PO PACK: 17.0000 g | PACK | Freq: Every day | ORAL | 0 refills | Status: DC | PRN

## 2019-06-28 MED ORDER — VITAMIN D 25 MCG (1000 UNIT) PO TABS
1000.0000 [IU] | ORAL_TABLET | Freq: Every day | ORAL | Status: DC
  Administered 2019-06-28: 1000 [IU] via ORAL
  Filled 2019-06-28: qty 1

## 2019-06-28 MED ORDER — PANTOPRAZOLE SODIUM 40 MG PO TBEC: 40.0000 mg | DELAYED_RELEASE_TABLET | Freq: Every day | ORAL | 0 refills | Status: DC

## 2019-06-28 NOTE — Discharge Summary (Signed)
[facility] NAME: Kathleen [name]    MR#:  [id number]  DATE OF BIRTH:  [birth year]  DATE OF ADMISSION:  06/26/2019 ADMITTING PHYSICIAN: Lavina Hamman, MD  DATE OF DISCHARGE: 06/28/2019  PRIMARY CARE PHYSICIAN: Idelle Crouch, MD    ADMISSION DIAGNOSIS:  Hypercalcemia [E83.52] Hypercalcemia due to a drug [E83.52, T50.905A] Generalized abdominal pain [R10.84]  DISCHARGE DIAGNOSIS:  Active Problems:   HTN (hypertension)   COPD (chronic obstructive pulmonary disease) (HCC)   Atrial fibrillation (HCC)   Hypercalcemia due to a drug   (HFpEF) heart failure with preserved ejection fraction (HCC)   AKI (acute kidney injury) ([place])   SECONDARY DIAGNOSIS:   Past Medical History:  Diagnosis Date  . Asthma   . Atypical atrial flutter ([place])   . 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 CHF (congestive heart failure) ([place])   . Chronic renal insufficiency   . 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 COPD (chronic obstructive pulmonary disease) ([place])   . Gout   . Hypertension   . Longstanding persistent atrial fibrillation ([place])   . Nonischemic cardiomyopathy ([place])   . Obesity   . Pacemaker   . Spontaneous pneumothorax 2013    HOSPITAL COURSE:   1.  Hypercalcemia.  Likely combination of dehydration and calcium supplementation.  Patient was taking Tums.  Calcium improved from 14.5 down to 9.6 in 2 days.  No other treatments were given besides IV fluids.  PTH normal. 2.  Abdominal pain and constipation.  Patient complains of distention.  CT scan of the abdomen was negative.  Patient had not had a bowel movement in a while I did give lactulose today which she did have quite a few bowel movements.  She is still feeling a little distended.  Will prescribe Protonix upon going home so the patient does not have to use Tums. 3.  Chronic respiratory failure and COPD.  Continue all the patient's usual  nebulizers and medications and chronic prednisone. 4.  Acute kidney injury on chronic kidney disease stage IIIa 5.  Chronic diastolic congestive heart failure.  Can continue torsemide at home. 6.  Chronic atrial fibrillation.  Continue Xarelto for anticoagulation 7.  Hypothyroidism unspecified on levothyroxine 8.  Home health PT set up  DISCHARGE CONDITIONS:   Satisfactory  CONSULTS OBTAINED:  Nephrology  DRUG ALLERGIES:   Allergies  Allergen Reactions  . Allopurinol Other (See Comments)    Reaction:  Dizziness   . Clindamycin Anaphylaxis and Hives  . Pneumococcal 13-Val Conj Vacc Itching, Swelling and Rash  . Dronedarone Rash  . Entresto [Sacubitril-Valsartan] Other (See Comments)    hypotension  . Fosamax [Alendronate Sodium] Nausea Only  . Meperidine Nausea And Vomiting  . Penicillins Hives and Other (See Comments)    Has patient had a PCN reaction causing immediate rash, facial/tongue/throat swelling, SOB or lightheadedness with hypotension: No Has patient had a PCN reaction causing severe rash involving mucus membranes or skin necrosis: No Has patient had a PCN reaction that required hospitalization No Has patient had a PCN reaction occurring within the last 10 years: No If all of the above answers are "NO", then may proceed with Cephalosporin use.  . Rosuvastatin Other (See Comments)    Reaction:  Muscle spasms   . Tetracycline Hives  . Adhesive [Tape] Rash  . Lovastatin Rash    Other reaction(s): Muscle Pain    DISCHARGE MEDICATIONS:   Allergies  as of 06/28/2019      Reactions   Allopurinol Other (See Comments)   Reaction:  Dizziness    Clindamycin Anaphylaxis, Hives   Pneumococcal 13-val Conj Vacc Itching, Swelling, Rash   Dronedarone Rash   Entresto [sacubitril-valsartan] Other (See Comments)   hypotension   Fosamax [alendronate Sodium] Nausea Only   Meperidine Nausea And Vomiting   Penicillins Hives, Other (See Comments)   Has patient had a PCN reaction  causing immediate rash, facial/tongue/throat swelling, SOB or lightheadedness with hypotension: No Has patient had a PCN reaction causing severe rash involving mucus membranes or skin necrosis: No Has patient had a PCN reaction that required hospitalization No Has patient had a PCN reaction occurring within the last 10 years: No If all of the above answers are "NO", then may proceed with Cephalosporin use.   Rosuvastatin Other (See Comments)   Reaction:  Muscle spasms    Tetracycline Hives   Adhesive [tape] Rash   Lovastatin Rash   Other reaction(s): Muscle Pain      Medication List    STOP taking these medications   spironolactone 50 MG tablet Commonly known as: ALDACTONE     TAKE these medications   acetylcysteine 20 % nebulizer solution Commonly known as: MUCOMYST Take 4 mLs by nebulization 4 (four) times daily.   albuterol 108 (90 Base) MCG/ACT inhaler Commonly known as: VENTOLIN HFA Inhale 2 puffs into the lungs every 4 (four) hours as needed for wheezing or shortness of breath.   albuterol (2.5 MG/3ML) 0.083% nebulizer solution Commonly known as: PROVENTIL Take 2.5 mg by nebulization 4 (four) times daily.   azithromycin 250 MG tablet Commonly known as: ZITHROMAX Take 250 mg by mouth every Monday, Wednesday, and Friday. (at dinnertime)   budesonide-formoterol 160-4.5 MCG/ACT inhaler Commonly known as: SYMBICORT Inhale 2 puffs into the lungs 2 (two) times daily.   cholecalciferol 1000 units tablet Commonly known as: VITAMIN D Take 2,000 Units by mouth at bedtime.   colchicine 0.6 MG tablet Take 1 tablet (0.6 mg total) by mouth daily as needed (for gout flares). What changed: how much to take   diclofenac sodium 1 % Gel Commonly known as: VOLTAREN Apply 2 g topically 4 (four) times daily as needed (pain).   docusate sodium 100 MG capsule Commonly known as: COLACE Take 100 mg by mouth daily.   febuxostat 40 MG tablet Commonly known as: ULORIC Take 40 mg by  mouth daily.   fluticasone 50 MCG/ACT nasal spray Commonly known as: FLONASE Place 2 sprays into both nostrils daily.   ibandronate 150 MG tablet Commonly known as: BONIVA Take 150 mg by mouth every 30 (thirty) days.   Iron 28 MG Tabs Take 28 mg by mouth daily.   levothyroxine 75 MCG tablet Commonly known as: SYNTHROID Take 75 mcg by mouth daily before breakfast.   Magnesium 250 MG Tabs Take 500 mg by mouth every evening.   pantoprazole 40 MG tablet Commonly known as: PROTONIX Take 1 tablet (40 mg total) by mouth daily. Start taking on: June 29, 2019   polyethylene glycol 17 g packet Commonly known as: MIRALAX / GLYCOLAX Take 17 g by mouth daily as needed for moderate constipation.   potassium chloride 10 MEQ tablet Commonly known as: KLOR-CON Take 10 mEq by mouth 2 (two) times daily.   predniSONE 10 MG tablet Commonly known as: DELTASONE Take 10 mg by mouth daily.   simethicone 80 MG chewable tablet Commonly known as: MYLICON Chew 1 tablet (80 mg  total) by mouth 4 (four) times daily.   Spiriva Respimat 2.5 MCG/ACT Aers Generic drug: Tiotropium Bromide Monohydrate Inhale 2 puffs into the lungs daily.   torsemide 20 MG tablet Commonly known as: DEMADEX Take 20 mg by mouth daily.   Xarelto 20 MG Tabs tablet Generic drug: rivaroxaban Take 20 mg by mouth daily with supper.        DISCHARGE INSTRUCTIONS:   Follow-up PMD 5 days  If you experience worsening of your admission symptoms, develop shortness of breath, life threatening emergency, suicidal or homicidal thoughts you must seek medical attention immediately by calling 911 or calling your MD immediately  if symptoms less severe.  You Must read complete instructions/literature along with all the possible adverse reactions/side effects for all the Medicines you take and that have been prescribed to you. Take any new Medicines after you have completely understood and accept all the possible adverse  reactions/side effects.   Please note  You were cared for by a hospitalist during your hospital stay. If you have any questions about your discharge medications or the care you received while you were in the hospital after you are discharged, you can call the unit and asked to speak with the hospitalist on call if the hospitalist that took care of you is not available. Once you are discharged, your primary care physician will handle any further medical issues. Please note that NO REFILLS for any discharge medications will be authorized once you are discharged, as it is imperative that you return to your primary care physician (or establish a relationship with a primary care physician if you do not have one) for your aftercare needs so that they can reassess your need for medications and monitor your lab values.    Today   CHIEF COMPLAINT:   Chief Complaint  Patient presents with  . Abdominal Pain    HISTORY OF PRESENT ILLNESS:  Kathleen [name]  is a 74 y.o. female came in with abdominal pain and found to have severe hypercalcemia   VITAL SIGNS:  Blood pressure (!) 140/57, pulse 69, temperature 98 F (36.7 C), temperature source Oral, resp. rate 16, height 5' 1.5" (1.562 m), weight 96.2 kg, SpO2 100 %.  I/O:    Intake/Output Summary (Last 24 hours) at 06/28/2019 1825 Last data filed at 06/28/2019 7026 Gross per 24 hour  Intake 1248.49 ml  Output 1800 ml  Net -551.51 ml    PHYSICAL EXAMINATION:  GENERAL:  74 y.o.-year-old patient lying in the bed with no acute distress.  EYES: Pupils equal, round, reactive to light and accommodation. No scleral icterus. Extraocular muscles intact.  HEENT: Head atraumatic, normocephalic. Oropharynx and nasopharynx clear.  NECK:  Supple, no jugular venous distention. No thyroid enlargement, no tenderness.  LUNGS: Normal breath sounds bilaterally, no wheezing, rales,rhonchi or crepitation. No use of accessory muscles of respiration.  CARDIOVASCULAR:  S1, S2 normal. No murmurs, rubs, or gallops.  ABDOMEN: Soft, non-tender, non-distended. Bowel sounds present. No organomegaly or mass.  EXTREMITIES: No pedal edema, cyanosis, or clubbing.  NEUROLOGIC: Cranial nerves II through XII are intact. Muscle strength 5/5 in all extremities. Sensation intact. Gait not checked.  PSYCHIATRIC: The patient is alert and oriented x 3.  SKIN: No obvious rash, lesion, or ulcer.   DATA REVIEW:   CBC Recent Labs  Lab 06/28/19 0427  WBC 8.0  HGB 12.3  HCT 36.9  PLT 148*    Chemistries  Recent Labs  Lab 06/28/19 0427  NA 139  K 3.1*  CL 100  CO2 30  GLUCOSE 134*  BUN 28*  CREATININE 1.10*  CALCIUM 9.6  MG 1.9  AST 13*  ALT 10  ALKPHOS 35*  BILITOT 0.6    Microbiology Results  Results for orders placed or performed during the hospital encounter of 06/26/19  SARS CORONAVIRUS 2 (TAT 6-24 HRS) Nasopharyngeal Nasopharyngeal Swab     Status: None   Collection Time: 06/26/19  6:56 PM   Specimen: Nasopharyngeal Swab  Result Value Ref Range Status   SARS Coronavirus 2 NEGATIVE NEGATIVE Final    Comment: (NOTE) SARS-CoV-2 target nucleic acids are NOT DETECTED. The SARS-CoV-2 RNA is generally detectable in upper and lower respiratory specimens during the acute phase of infection. Negative results do not preclude SARS-CoV-2 infection, do not rule out co-infections with other pathogens, and should not be used as the sole basis for treatment or other patient management decisions. Negative results must be combined with clinical observations, patient history, and epidemiological information. The expected result is Negative. Fact Sheet for Patients: HairSlick.no Fact Sheet for Healthcare Providers: quierodirigir.com This test is not yet approved or cleared by the [place] FDA and  has been authorized for detection and/or diagnosis of SARS-CoV-2 by FDA under an Emergency Use Authorization  (EUA). This EUA will remain  in effect (meaning this test can be used) for the duration of the COVID-19 declaration under Section 56 4(b)(1) of the Act, 21 U.S.C. section 360bbb-3(b)(1), unless the authorization is terminated or revoked sooner. Performed at [facility], [address], [place], [place] [address]       Management plans discussed with the patient, and she is in agreement.  CODE STATUS:     Code Status Orders  (From admission, onward)         Start     Ordered   06/26/19 1741  Full code  Continuous     06/26/19 1742        Code Status History    Date Active Date Inactive Code Status Order ID Comments User Context   03/28/2018 1836 03/30/2018 1811 Full Code [id number]  Enedina Finner, MD Inpatient   09/17/2015 1847 09/23/2015 1751 Full Code [id number]  Milagros Loll, MD ED   09/08/2015 2032 09/11/2015 1958 Full Code [id number]  Katharina Caper, MD Inpatient   Advance Care Planning Activity      TOTAL TIME TAKING CARE OF THIS PATIENT: 35 minutes.    Alford Highland M.D on 06/28/2019 at 6:25 PM  Between 7am to 6pm - Pager - [phone]  After 6pm go to [email or web address] - password [email or web address]  [facility]  CC: Primary care physician; Marguarite Arbour, MD

## 2019-06-28 NOTE — TOC Initial Note (Signed)
Transition of Care [facility]) - Initial/Assessment Note    Patient Details  Name: Kathleen [name] MRN: [id number] Date of Birth: 1946-05-16  Transition of Care [facility] CM/SW Contact:    Trenton Founds, RN Phone Number: 06/28/2019, 3:46 PM  Clinical Narrative:     RNCM assessed patient at bedside. Patient up ambulating from bathroom on arrival to room. Patient reports to feeling some better, does report she had some constipation but nurse gave her something for that worked. Patient reports that she lives alone independently and still drives. Patient reports that she does not leave them home very often and for the most part has her groceries and other supplies delivered to the home. Patient reports no problems with follow up with MD or obtaining medications. Patient reports she is open to PT service and most recently had services through [facility] home health and that she would like to have them back again. RNCM placed call to [name] Sax with [facility] and she is able to accept patient back. Updated bedside nurse of plan. RNCM will follow for any needs.             Expected Discharge Plan: Home w Home Health Services Barriers to Discharge: Barriers Resolved   Patient Goals and CMS Choice Patient states their goals for this hospitalization and ongoing recovery are:: I want to figure out what is causing this issue with my Calcium CMS Medicare.gov Compare Post Acute Care list provided to:: Patient    Expected Discharge Plan and Services Expected Discharge Plan: Home w Home Health Services   Discharge Planning Services: CM Consult Post Acute Care Choice: Home Health Living arrangements for the past 2 months: Single Family Home Expected Discharge Date: 06/28/19                         HH Arranged: PT HH Agency: [facility] Date HH Agency Contacted: 06/28/19 Time HH Agency Contacted: 1500 Representative spoke with at [facility] Agency: [name] Sax  Prior Living  Arrangements/Services Living arrangements for the past 2 months: Single Family Home Lives with:: Self Patient language and need for interpreter reviewed:: Yes Do you feel safe going back to the place where you live?: Yes      Need for Family Participation in Patient Care: No (Comment) Care giver support system in place?: No (comment)   Criminal Activity/Legal Involvement Pertinent to Current Situation/Hospitalization: No - Comment as needed  Activities of Daily Living Home Assistive Devices/Equipment: Cane (specify quad or straight) ADL Screening (condition at time of admission) Patient's cognitive ability adequate to safely complete daily activities?: Yes Is the patient deaf or have difficulty hearing?: No Does the patient have difficulty seeing, even when wearing glasses/contacts?: No Does the patient have difficulty concentrating, remembering, or making decisions?: No Patient able to express need for assistance with ADLs?: Yes Does the patient have difficulty dressing or bathing?: No Independently performs ADLs?: Yes (appropriate for developmental age) Does the patient have difficulty walking or climbing stairs?: No Weakness of Legs: None Weakness of Arms/Hands: None  Permission Sought/Granted                  Emotional Assessment Appearance:: Appears stated age Attitude/Demeanor/Rapport: Engaged Affect (typically observed): Appropriate Orientation: : Oriented to Self, Oriented to Place, Oriented to  Time, Oriented to Situation Alcohol / Substance Use: Never Used Psych Involvement: No (comment)  Admission diagnosis:  Hypercalcemia [E83.52] Hypercalcemia due to a drug [E83.52, T50.905A] Generalized abdominal pain [R10.84] Patient  Active Problem List   Diagnosis Date Noted  . Hypercalcemia due to a drug 06/26/2019  . (HFpEF) heart failure with preserved ejection fraction ([place]) 06/26/2019  . AKI (acute kidney injury) ([place]) 06/26/2019  . Chronic systolic heart failure  ([place]) 04/06/2018  . HTN (hypertension) 04/06/2018  . COPD (chronic obstructive pulmonary disease) ([place]) 04/06/2018  . Atrial fibrillation ([place]) 04/06/2018  . Acute bronchitis 09/17/2015  . Malnutrition of moderate degree 09/11/2015  . Fever 09/08/2015  . Hyponatremia 09/08/2015   PCP:  Idelle Crouch, MD Pharmacy:   [facility] [phone], [place] [address] Phone: [phone] Fax: [phone]     Social Determinants of Health (SDOH) Interventions    Readmission Risk Interventions Readmission Risk Prevention Plan 06/28/2019  Transportation Screening Complete  PCP or Specialist Appt within 3-5 Days Complete  HRI or Home Care Consult Complete  Palliative Care Screening Not Applicable  Medication Review (RN Care Manager) Complete  Some recent data might be hidden

## 2019-06-28 NOTE — Plan of Care (Signed)
The patient has been discharged. IV removed. No fall. Discharged paper work provided. Discharged with oxygen.  Problem: Acute Rehab PT Goals(only PT should resolve) Goal: Pt Will Perform Standing Balance Or Pre-Gait Outcome: Completed/Met Goal: Pt Will Ambulate Outcome: Completed/Met Goal: Pt Will Go Up/Down Stairs Outcome: Completed/Met   Problem: Education: Goal: Knowledge of General Education information will improve Description: Including pain rating scale, medication(s)/side effects and non-pharmacologic comfort measures Outcome: Completed/Met   Problem: Health Behavior/Discharge Planning: Goal: Ability to manage health-related needs will improve Outcome: Completed/Met   Problem: Clinical Measurements: Goal: Ability to maintain clinical measurements within normal limits will improve Outcome: Completed/Met Goal: Will remain free from infection Outcome: Completed/Met Goal: Diagnostic test results will improve Outcome: Completed/Met Goal: Respiratory complications will improve Outcome: Completed/Met Goal: Cardiovascular complication will be avoided Outcome: Completed/Met   Problem: Activity: Goal: Risk for activity intolerance will decrease Outcome: Completed/Met   Problem: Nutrition: Goal: Adequate nutrition will be maintained Outcome: Completed/Met   Problem: Coping: Goal: Level of anxiety will decrease Outcome: Completed/Met   Problem: Elimination: Goal: Will not experience complications related to bowel motility Outcome: Completed/Met Goal: Will not experience complications related to urinary retention Outcome: Completed/Met   Problem: Pain Managment: Goal: General experience of comfort will improve Outcome: Completed/Met   Problem: Safety: Goal: Ability to remain free from injury will improve Outcome: Completed/Met

## 2019-06-28 NOTE — Progress Notes (Signed)
[facility] [place], [place] 06/28/19  Subjective:   Hospital day # 2  cvs: Denies any chest pain.  No leg edema pulm: Breathing is better compared to yesterday.  Now on 3 L oxygen.  Still has excessive coughing Gi: Appetite is good.  No nausea or vomiting. Renal: Serum creatinine and calcium levels have improved 01/19 0701 - 01/20 0700 In: 2948.3 [I.V.:2948.3] Out: 2200 [Urine:2200] Lab Results  Component Value Date   CREATININE 1.10 (H) 06/28/2019   CREATININE 1.50 (H) 06/27/2019   CREATININE 1.66 (H) 06/26/2019     Objective:  Vital signs in last 24 hours:  Temp:  [97.5 F (36.4 C)-98.4 F (36.9 C)] 97.5 F (36.4 C) (01/20 0408) Pulse Rate:  [70-79] 70 (01/20 0408) Resp:  [20] 20 (01/20 0408) BP: (137-140)/(61-67) 137/67 (01/20 0408) SpO2:  [100 %] 100 % (01/20 0408)  Weight change:  Filed Weights   06/26/19 1404 06/26/19 1408  Weight: 96.2 kg 96.2 kg    Intake/Output:    Intake/Output Summary (Last 24 hours) at 06/28/2019 0953 Last data filed at 06/28/2019 2725 Gross per 24 hour  Intake 2948.26 ml  Output 2200 ml  Net 748.26 ml     Physical Exam: General:  Laying in the bed, no acute distress  HEENT  anicteric, moist oral mucous membranes  Pulm/lungs  diffuse coarse breath sounds, wheezing, [place] O2  CVS/Heart  no rub or gallop  Abdomen:   Soft, nontender  Extremities:  No peripheral edema  Neurologic:  Alert, oriented  Skin:  No acute rashes    Basic Metabolic Panel:  Recent Labs  Lab 06/26/19 1412 06/26/19 1618 06/27/19 0417 06/28/19 0427  NA 135  --  137 139  K 3.8  --  3.4* 3.1*  CL 92*  --  95* 100  CO2 27  --  28 30  GLUCOSE 200*  --  151* 134*  BUN 28*  --  32* 28*  CREATININE 1.66*  --  1.50* 1.10*  CALCIUM 14.5* 14.3* 12.1* 9.6  MG  --   --  2.0 1.9  PHOS  --   --  3.7  --      CBC: Recent Labs  Lab 06/26/19 1412 06/27/19 0417 06/28/19 0427  WBC 15.4* 13.8* 8.0  NEUTROABS  --   --  5.7  HGB 17.0*  14.7 12.3  HCT 50.8* 44.8 36.9  MCV 85.2 87.2 86.4  PLT 269 215 148*     No results found for: HEPBSAG, HEPBSAB, HEPBIGM    Microbiology:  Recent Results (from the past 240 hour(s))  SARS CORONAVIRUS 2 (TAT 6-24 HRS) Nasopharyngeal Nasopharyngeal Swab     Status: None   Collection Time: 06/26/19  6:56 PM   Specimen: Nasopharyngeal Swab  Result Value Ref Range Status   SARS Coronavirus 2 NEGATIVE NEGATIVE Final    Comment: (NOTE) SARS-CoV-2 target nucleic acids are NOT DETECTED. The SARS-CoV-2 RNA is generally detectable in upper and lower respiratory specimens during the acute phase of infection. Negative results do not preclude SARS-CoV-2 infection, do not rule out co-infections with other pathogens, and should not be used as the sole basis for treatment or other patient management decisions. Negative results must be combined with clinical observations, patient history, and epidemiological information. The expected result is Negative. Fact Sheet for Patients: HairSlick.no Fact Sheet for Healthcare Providers: quierodirigir.com This test is not yet approved or cleared by the [place] FDA and  has been authorized for detection and/or diagnosis of SARS-CoV-2  by FDA under an Emergency Use Authorization (EUA). This EUA will remain  in effect (meaning this test can be used) for the duration of the COVID-19 declaration under Section 56 4(b)(1) of the Act, 21 U.S.C. section 360bbb-3(b)(1), unless the authorization is terminated or revoked sooner. Performed at [facility], [address] [place], [place] [address]     Coagulation Studies: No results for input(s): LABPROT, INR in the last 72 hours.  Urinalysis: Recent Labs    06/26/19 1412  COLORURINE YELLOW*  LABSPEC 1.012  PHURINE 5.0  GLUCOSEU NEGATIVE  HGBUR NEGATIVE  BILIRUBINUR NEGATIVE  KETONESUR NEGATIVE  PROTEINUR NEGATIVE  NITRITE NEGATIVE   LEUKOCYTESUR TRACE*      Imaging: CT ABDOMEN PELVIS W CONTRAST  Result Date: 06/26/2019 CLINICAL DATA:  Acute lower abdominal pain and distention. EXAM: CT ABDOMEN AND PELVIS WITH CONTRAST TECHNIQUE: Multidetector CT imaging of the abdomen and pelvis was performed using the standard protocol following bolus administration of intravenous contrast. CONTRAST:  12mL OMNIPAQUE IOHEXOL 300 MG/ML  SOLN COMPARISON:  August 26, 2017. April 11th 2019. FINDINGS: Lower chest: No acute abnormality. Hepatobiliary: No biliary dilatation. Status post cholecystectomy. Stable hepatic cysts. Pancreas: Unremarkable. No pancreatic ductal dilatation or surrounding inflammatory changes. Spleen: Normal in size without focal abnormality. Adrenals/Urinary Tract: Adrenal glands appear normal. No hydronephrosis or renal obstruction is noted. No renal or ureteral calculi are noted. Urinary bladder is unremarkable. Left renal exophytic cyst is again noted. Stomach/Bowel: The stomach appears normal. There is no evidence of bowel obstruction. Diverticulosis of sigmoid colon is noted without inflammation. The appendix is not visualized. Vascular/Lymphatic: Aortic atherosclerosis. No enlarged abdominal or pelvic lymph nodes. Reproductive: Status post hysterectomy. No adnexal masses. Other: No abdominal wall hernia or abnormality. No abdominopelvic ascites. Musculoskeletal: No acute or significant osseous findings. IMPRESSION: 1. Diverticulosis of sigmoid colon without inflammation. 2. No acute abnormality seen in the abdomen or pelvis. Aortic Atherosclerosis (ICD10-I70.0). Electronically Signed   By: Lupita Raider M.D.   On: 06/26/2019 15:46     Medications:    . azithromycin  250 mg Oral Q M,W,F  . benzonatate  100 mg Oral TID  . cholecalciferol  1,000 Units Oral Daily  . docusate sodium  100 mg Oral BID  . fluticasone  1 spray Each Nare Daily  . guaiFENesin  600 mg Oral BID  . lactulose  30 g Oral STAT  . levothyroxine  75  mcg Oral Q0600  . mometasone-formoterol  2 puff Inhalation BID  . pantoprazole  40 mg Oral Daily  . polyethylene glycol  17 g Oral Daily  . potassium chloride  40 mEq Oral Once  . predniSONE  10 mg Oral Daily  . rivaroxaban  20 mg Oral Q supper  . senna-docusate  2 tablet Oral BID  . simethicone  80 mg Oral QID  . sodium chloride flush  3 mL Intravenous Once  . tiotropium  1 capsule Inhalation Daily  . torsemide  20 mg Oral Daily   acetaminophen **OR** acetaminophen, albuterol, bisacodyl, chlorpheniramine-HYDROcodone, ondansetron **OR** ondansetron (ZOFRAN) IV, oxyCODONE  Assessment/ Plan:  74 y.o. 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 intake, acute kidney injury, primary hyperparathyroidism Calcium level is improving with IV  hydration + Forced diuresis with Torsemide Oral intake is adequate, may d/c iv fluids. Continue torsemide as needed Continue to monitor closely  #Acute kidney injury Multifactorial including volume depletion, IV contrast exposure for CT abdomen pelvis on January 18 Conservative management.  Serum creatinine has declined from 1.66-1.50->1.10 today Baseline creatinine of 0.80 from December 28 and outpatient records    LOS: [place] 1/20/20219:53 [place], [place]  Note: This note was prepared with Dragon dictation. Any transcription errors are unintentional

## 2019-06-28 NOTE — Discharge Instructions (Signed)
Hypercalcemia Hypercalcemia is when the level of calcium in a person's blood is above normal. The body needs calcium to make bones and keep them strong. Calcium also helps the muscles, nerves, brain, and heart work the way they should. Most of the calcium in the body is in the bones. There is also some calcium in the blood. Hypercalcemia can happen when calcium comes out of the bones, or when the kidneys are not able to remove calcium from the blood. Hypercalcemia can be mild or severe. What are the causes? There are many possible causes of hypercalcemia. Common causes of this condition include:  Hyperparathyroidism. This is a condition in which the body produces too much parathyroid hormone. There are four parathyroid glands in your neck. These glands produce a chemical messenger (hormone) that helps the body absorb calcium from foods and helps your bones release calcium.  Certain kinds of cancer. Less common causes of hypercalcemia include:  Getting too much calcium or vitamin D from your diet.  Kidney failure.  Hyperthyroidism.  Severe dehydration.  Being on bed rest or being inactive for a long time.  Certain medicines.  Infections. What increases the risk? You are more likely to develop this condition if you:  Are female.  Are 60 years of age or older.  Have a family history of hypercalcemia. What are the signs or symptoms? Mild hypercalcemia that starts slowly may not cause symptoms. Severe, sudden hypercalcemia is more likely to cause symptoms, such as:  Being more thirsty than usual.  Needing to urinate more often than usual.  Abdominal pain.  Nausea and vomiting.  Constipation.  Muscle pain, twitching, or weakness.  Feeling very tired. How is this diagnosed?  Hypercalcemia is usually diagnosed with a blood test. You may also have tests to help determine what is causing this condition, such as imaging tests and more blood tests. How is this  treated? Treatment for hypercalcemia depends on the cause. Treatment may include:  Receiving fluids through an IV.  Medicines that: ? Keep calcium levels steady after receiving fluids (loop diuretics). ? Keep calcium in your bones (bisphosphonates). ? Lower the calcium level in your blood.  Surgery to remove overactive parathyroid glands.  A procedure that filters your blood to correct calcium levels (hemodialysis). Follow these instructions at home:   Take over-the-counter and prescription medicines only as told by your health care provider.  Follow instructions from your health care provider about eating or drinking restrictions.  Drink enough fluid to keep your urine pale yellow.  Stay active. Weight-bearing exercise helps to keep calcium in your bones. Follow instructions from your health care provider about what type and level of exercise is safe for you.  Keep all follow-up visits as told by your health care provider. This is important. Contact a health care provider if you have:  A fever.  A heartbeat that is irregular or very fast.  Changes in mood, memory, or personality. Get help right away if you:  Have severe abdominal pain.  Have chest pain.  Have trouble breathing.  Become very confused and sleepy.  Lose consciousness. Summary  Hypercalcemia is when the level of calcium in a person's blood is above normal. The body needs calcium to make bones and keep them strong. Calcium also helps the muscles, nerves, brain, and heart work the way they should.  There are many possible causes of hypercalcemia, and treatment depends on the cause.  Take over-the-counter and prescription medicines only as told by your health care   provider.  Follow instructions from your health care provider about eating or drinking restrictions. This information is not intended to replace advice given to you by your health care provider. Make sure you discuss any questions you have with  your health care provider. Document Revised: 06/21/2018 Document Reviewed: 02/28/2018 Elsevier Patient Education  2020 Elsevier Inc.  

## 2019-07-03 LAB — PTH-RELATED PEPTIDE: PTH-related peptide: <2 pmol/L

## 2019-07-13 ENCOUNTER — Other Ambulatory Visit: Payer: Self-pay | Admitting: Internal Medicine

## 2019-07-13 ENCOUNTER — Ambulatory Visit: Payer: Medicare Other | Attending: Internal Medicine

## 2019-07-13 DIAGNOSIS — R1310 Dysphagia, unspecified: Secondary | ICD-10-CM

## 2019-07-13 DIAGNOSIS — Z23 Encounter for immunization: Secondary | ICD-10-CM

## 2019-07-13 NOTE — Progress Notes (Signed)
   Covid-19 Vaccination Clinic  Name:  Kathleen [name]    MRN: [id number] DOB: [birth year]  07/13/2019  [name] was observed post Covid-19 immunization for 15 minutes without incidence. She was provided with Vaccine Information Sheet and instruction to access the V-Safe system.   [name] was instructed to call 911 with any severe reactions post vaccine: [name] Difficulty breathing  . Swelling of your face and throat  . A fast heartbeat  . A bad rash all over your body  . Dizziness and weakness    Immunizations Administered    Name Date Dose VIS Date Route   Pfizer COVID-19 Vaccine 07/13/2019 12:29 PM 0.3 mL 05/19/2019 Intramuscular   Manufacturer: [employer], [employer]   Lot: [id number]   NDC: [address]-9

## 2019-07-25 ENCOUNTER — Other Ambulatory Visit: Payer: Self-pay

## 2019-07-25 ENCOUNTER — Other Ambulatory Visit: Payer: Self-pay | Admitting: Internal Medicine

## 2019-07-25 ENCOUNTER — Ambulatory Visit
Admission: RE | Admit: 2019-07-25 | Discharge: 2019-07-25 | Disposition: A | Discharge status: Home / Self Care | Payer: Medicare Other | Source: Ambulatory Visit | Attending: Internal Medicine | Admitting: Internal Medicine

## 2019-07-25 DIAGNOSIS — R1310 Dysphagia, unspecified: Secondary | ICD-10-CM | POA: Insufficient documentation

## 2019-08-08 ENCOUNTER — Ambulatory Visit: Payer: Medicare Other | Attending: Internal Medicine

## 2019-08-08 DIAGNOSIS — Z23 Encounter for immunization: Secondary | ICD-10-CM

## 2019-08-08 NOTE — Progress Notes (Signed)
   Covid-19 Vaccination Clinic  Name:  Kathleen [name]    MRN: [id number] DOB: Dec 02, 1945  08/08/2019  [name] was observed post Covid-19 immunization for 15 minutes without incident. She was provided with Vaccine Information Sheet and instruction to access the V-Safe system.   [name] was instructed to call 911 with any severe reactions post vaccine: [name] Difficulty breathing  . Swelling of face and throat  . A fast heartbeat  . A bad rash all over body  . Dizziness and weakness   Immunizations Administered    Name Date Dose VIS Date Route   Pfizer COVID-19 Vaccine 08/08/2019  9:20 AM 0.3 mL 05/19/2019 Intramuscular   Manufacturer: [employer], [employer]   Lot: [id number]   NDC: [address]-7

## 2019-10-12 DIAGNOSIS — Z789 Other specified health status: Secondary | ICD-10-CM | POA: Insufficient documentation

## 2019-11-14 ENCOUNTER — Other Ambulatory Visit: Payer: Self-pay | Admitting: Family Medicine

## 2019-11-14 DIAGNOSIS — R1900 Intra-abdominal and pelvic swelling, mass and lump, unspecified site: Secondary | ICD-10-CM

## 2019-11-14 DIAGNOSIS — I5042 Chronic combined systolic (congestive) and diastolic (congestive) heart failure: Secondary | ICD-10-CM

## 2019-11-20 ENCOUNTER — Ambulatory Visit
Admission: RE | Admit: 2019-11-20 | Discharge: 2019-11-20 | Disposition: A | Discharge status: Home / Self Care | Payer: Medicare Other | Source: Ambulatory Visit | Attending: Family Medicine | Admitting: Family Medicine

## 2019-11-20 ENCOUNTER — Other Ambulatory Visit: Payer: Self-pay

## 2019-11-20 DIAGNOSIS — R1900 Intra-abdominal and pelvic swelling, mass and lump, unspecified site: Secondary | ICD-10-CM

## 2019-11-20 DIAGNOSIS — I5042 Chronic combined systolic (congestive) and diastolic (congestive) heart failure: Secondary | ICD-10-CM | POA: Insufficient documentation

## 2019-11-23 ENCOUNTER — Ambulatory Visit: Payer: Medicare Other

## 2020-01-16 DIAGNOSIS — R5381 Other malaise: Secondary | ICD-10-CM | POA: Insufficient documentation

## 2020-02-07 ENCOUNTER — Other Ambulatory Visit: Payer: Self-pay | Admitting: Internal Medicine

## 2020-02-07 DIAGNOSIS — Z1231 Encounter for screening mammogram for malignant neoplasm of breast: Secondary | ICD-10-CM

## 2020-04-08 ENCOUNTER — Other Ambulatory Visit: Payer: Self-pay

## 2020-04-08 ENCOUNTER — Ambulatory Visit
Admission: RE | Admit: 2020-04-08 | Discharge: 2020-04-08 | Disposition: A | Discharge status: Home / Self Care | Payer: Medicare Other | Source: Ambulatory Visit | Attending: Internal Medicine | Admitting: Internal Medicine

## 2020-04-08 DIAGNOSIS — Z1231 Encounter for screening mammogram for malignant neoplasm of breast: Secondary | ICD-10-CM | POA: Diagnosis present

## 2020-05-08 HISTORY — PX: PACEMAKER INSERTION: SHX728

## 2020-11-07 ENCOUNTER — Encounter (HOSPITAL_BASED_OUTPATIENT_CLINIC_OR_DEPARTMENT_OTHER): Payer: Self-pay | Admitting: Emergency Medicine

## 2020-11-07 ENCOUNTER — Emergency Department (HOSPITAL_BASED_OUTPATIENT_CLINIC_OR_DEPARTMENT_OTHER): Payer: Medicare Other

## 2020-11-07 ENCOUNTER — Emergency Department (HOSPITAL_BASED_OUTPATIENT_CLINIC_OR_DEPARTMENT_OTHER)
Admission: EM | Admit: 2020-11-07 | Discharge: 2020-11-07 | Disposition: A | Discharge status: Home / Self Care | Payer: Medicare Other | Attending: Emergency Medicine | Admitting: Emergency Medicine

## 2020-11-07 ENCOUNTER — Other Ambulatory Visit: Payer: Self-pay

## 2020-11-07 ENCOUNTER — Other Ambulatory Visit (HOSPITAL_BASED_OUTPATIENT_CLINIC_OR_DEPARTMENT_OTHER): Payer: Self-pay

## 2020-11-07 DIAGNOSIS — Z7901 Long term (current) use of anticoagulants: Secondary | ICD-10-CM | POA: Insufficient documentation

## 2020-11-07 DIAGNOSIS — I4891 Unspecified atrial fibrillation: Secondary | ICD-10-CM | POA: Diagnosis not present

## 2020-11-07 DIAGNOSIS — J45909 Unspecified asthma, uncomplicated: Secondary | ICD-10-CM | POA: Diagnosis not present

## 2020-11-07 DIAGNOSIS — J8489 Other specified interstitial pulmonary diseases: Secondary | ICD-10-CM

## 2020-11-07 DIAGNOSIS — Z950 Presence of cardiac pacemaker: Secondary | ICD-10-CM | POA: Diagnosis not present

## 2020-11-07 DIAGNOSIS — Z87891 Personal history of nicotine dependence: Secondary | ICD-10-CM | POA: Diagnosis not present

## 2020-11-07 DIAGNOSIS — Z20822 Contact with and (suspected) exposure to covid-19: Secondary | ICD-10-CM | POA: Diagnosis not present

## 2020-11-07 DIAGNOSIS — J410 Simple chronic bronchitis: Secondary | ICD-10-CM | POA: Diagnosis not present

## 2020-11-07 DIAGNOSIS — R339 Retention of urine, unspecified: Secondary | ICD-10-CM | POA: Insufficient documentation

## 2020-11-07 DIAGNOSIS — R0602 Shortness of breath: Secondary | ICD-10-CM | POA: Diagnosis present

## 2020-11-07 DIAGNOSIS — Z7951 Long term (current) use of inhaled steroids: Secondary | ICD-10-CM | POA: Diagnosis not present

## 2020-11-07 DIAGNOSIS — I110 Hypertensive heart disease with heart failure: Secondary | ICD-10-CM | POA: Diagnosis not present

## 2020-11-07 DIAGNOSIS — I5022 Chronic systolic (congestive) heart failure: Secondary | ICD-10-CM | POA: Diagnosis not present

## 2020-11-07 DIAGNOSIS — J84116 Cryptogenic organizing pneumonia: Secondary | ICD-10-CM | POA: Diagnosis not present

## 2020-11-07 DIAGNOSIS — R911 Solitary pulmonary nodule: Secondary | ICD-10-CM

## 2020-11-07 LAB — COMPREHENSIVE METABOLIC PANEL
ALT: 10 U/L (ref 0–44)
AST: 11 U/L — ABNORMAL LOW (ref 15–41)
Albumin: 4 g/dL (ref 3.5–5.0)
Alkaline Phosphatase: 45 U/L (ref 38–126)
Anion gap: 11 (ref 5–15)
BUN: 16 mg/dL (ref 8–23)
CO2: 23 mmol/L (ref 22–32)
Calcium: 8.8 mg/dL — ABNORMAL LOW (ref 8.9–10.3)
Chloride: 104 mmol/L (ref 98–111)
Creatinine, Ser: 0.72 mg/dL (ref 0.44–1.00)
GFR, Estimated: >60 mL/min (ref >60)
Glucose, Bld: 125 mg/dL — ABNORMAL HIGH (ref 70–99)
Potassium: 3.7 mmol/L (ref 3.5–5.1)
Sodium: 138 mmol/L (ref 135–145)
Total Bilirubin: 0.8 mg/dL (ref 0.3–1.2)
Total Protein: 6.4 g/dL — ABNORMAL LOW (ref 6.5–8.1)

## 2020-11-07 LAB — URINALYSIS, ROUTINE W REFLEX MICROSCOPIC
Bilirubin Urine: NEGATIVE
Glucose, UA: NEGATIVE mg/dL
Hgb urine dipstick: NEGATIVE
Ketones, ur: NEGATIVE mg/dL
Nitrite: NEGATIVE
Protein, ur: NEGATIVE mg/dL
Specific Gravity, Urine: >1.03 — ABNORMAL HIGH (ref 1.005–1.030)
pH: 5.5 (ref 5.0–8.0)

## 2020-11-07 LAB — CBC WITH DIFFERENTIAL/PLATELET
Abs Immature Granulocytes: 0.07 10*3/uL (ref 0.00–0.07)
Basophils Absolute: 0.1 10*3/uL (ref 0.0–0.1)
Basophils Relative: 1 %
Eosinophils Absolute: 0.1 10*3/uL (ref 0.0–0.5)
Eosinophils Relative: 1 %
HCT: 41.9 % (ref 36.0–46.0)
Hemoglobin: 13.5 g/dL (ref 12.0–15.0)
Immature Granulocytes: 1 %
Lymphocytes Relative: 12 %
Lymphs Abs: 1.2 10*3/uL (ref 0.7–4.0)
MCH: 27.8 pg (ref 26.0–34.0)
MCHC: 32.2 g/dL (ref 30.0–36.0)
MCV: 86.4 fL (ref 80.0–100.0)
Monocytes Absolute: 0.8 10*3/uL (ref 0.1–1.0)
Monocytes Relative: 8 %
Neutro Abs: 7.7 10*3/uL (ref 1.7–7.7)
Neutrophils Relative %: 77 %
Platelets: 184 10*3/uL (ref 150–400)
RBC: 4.85 MIL/uL (ref 3.87–5.11)
RDW: 14.8 % (ref 11.5–15.5)
WBC: 10 10*3/uL (ref 4.0–10.5)
nRBC: 0 % (ref 0.0–0.2)

## 2020-11-07 LAB — URINALYSIS, MICROSCOPIC (REFLEX)

## 2020-11-07 LAB — RESP PANEL BY RT-PCR (FLU A&B, COVID) ARPGX2
Influenza A by PCR: NEGATIVE
Influenza B by PCR: NEGATIVE
SARS Coronavirus 2 by RT PCR: NEGATIVE

## 2020-11-07 LAB — BRAIN NATRIURETIC PEPTIDE: B Natriuretic Peptide: 31 pg/mL (ref 0.0–100.0)

## 2020-11-07 LAB — OCCULT BLOOD X 1 CARD TO LAB, STOOL: Fecal Occult Bld: NEGATIVE

## 2020-11-07 LAB — TROPONIN I (HIGH SENSITIVITY)
Troponin I (High Sensitivity): 4 ng/L (ref <18)
Troponin I (High Sensitivity): 4 ng/L (ref <18)

## 2020-11-07 MED ORDER — PREDNISONE 20 MG PO TABS
60.0000 mg | ORAL_TABLET | Freq: Every day | ORAL | 0 refills | Status: AC
  Filled 2020-11-07: qty 15, 5d supply, fill #0

## 2020-11-07 MED ORDER — PREDNISONE 20 MG PO TABS: 60.0000 mg | ORAL_TABLET | Freq: Every day | ORAL | 0 refills | Status: DC

## 2020-11-07 MED ORDER — CEFUROXIME AXETIL 500 MG PO TABS: 500.0000 mg | ORAL_TABLET | Freq: Two times a day (BID) | ORAL | 0 refills | Status: DC

## 2020-11-07 MED ORDER — AZITHROMYCIN 250 MG PO TABS: 250.0000 mg | ORAL_TABLET | Freq: Every day | ORAL | 0 refills | Status: DC

## 2020-11-07 MED ORDER — IPRATROPIUM-ALBUTEROL 0.5-2.5 (3) MG/3ML IN SOLN
3.0000 mL | Freq: Once | RESPIRATORY_TRACT | Status: AC
Start: 2020-11-07 — End: 2020-11-07
  Administered 2020-11-07: 3 mL via RESPIRATORY_TRACT
  Filled 2020-11-07: qty 3

## 2020-11-07 MED ORDER — AZITHROMYCIN 250 MG PO TABS
250.0000 mg | ORAL_TABLET | Freq: Every day | ORAL | 0 refills | Status: DC
  Filled 2020-11-07: qty 6, 6d supply, fill #0

## 2020-11-07 MED ORDER — CEFUROXIME AXETIL 500 MG PO TABS
500.0000 mg | ORAL_TABLET | Freq: Two times a day (BID) | ORAL | 0 refills | Status: DC
  Filled 2020-11-07 (×2): qty 14, 7d supply, fill #0

## 2020-11-07 NOTE — ED Triage Notes (Signed)
Pt via pov from home with sob that began 2 days ago. Pt reports hx of copd, chf, has pacemaker; states she is on 3.5L O2 chronically, and that she increased to 4 L yesterday. Pt states she took two covid tests at home that were negative. Pt coughing during triage. Reports painful urination as well as rectal blood (bright red, hx of hemorrhoids). Pt alert & oriented, using accessory muscles to breathe.

## 2020-11-07 NOTE — Discharge Instructions (Addendum)
You have a 6 mm nodule in the posterior aspect of the right middle lobe. You need a CT scan in 3-6 months. If it is normal, then you can have a CT in 18-24 months.

## 2020-11-07 NOTE — ED Notes (Signed)
Foley bag changed to a leg bag prior to discharge. 

## 2020-11-07 NOTE — ED Provider Notes (Signed)
[facility]   CSN: [id number] Arrival date & time: 11/07/20  [id number]     History Chief Complaint  Patient presents with  . Shortness of Breath    Kathleen [name] is a 75 y.o. female.  Kathleen [name] recently moved to the area and is now in assisted living.  She follows with [facility] cardiology and has a history of heart failure.  Ejection fraction 40 to 45% based on echo performed April 2021.  She has a history of a pacemaker for complete heart block and has had intermittent atrial fibrillation.  She also has a history of pulmonary [facility].  She has had a cough, chest tightness, and increased dyspnea for the past 1.5 weeks.  She has had stable 3 pillow orthopnea.  She has had to increase her supplemental oxygen up to about 4 L from her usual 2.  The history is provided by the patient.  Shortness of Breath Severity:  Severe Onset quality:  Gradual Duration:  2 weeks Timing:  Constant Progression:  Worsening Chronicity:  Recurrent Context comment:  Unknown cause Relieved by:  Nothing Worsened by:  Exertion Ineffective treatments:  Oxygen and inhaler Associated symptoms: chest pain (like someone is squeezing me), cough (sometimes productive of yellow sputum) and fever   Associated symptoms: no abdominal pain, no ear pain, no rash, no sore throat and no vomiting        Past Medical History:  Diagnosis Date  . Asthma   . Atypical atrial flutter (HCC)   . 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HCC)   . CHF (congestive heart failure) (HCC)   . Chronic renal insufficiency   . 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 COPD (chronic obstructive pulmonary disease) (HCC)   . Gout   . Hypertension   . Longstanding persistent atrial fibrillation (HCC)   . Nonischemic cardiomyopathy (HCC)   . Obesity   . Pacemaker   . Spontaneous pneumothorax 2013     Patient Active Problem List   Diagnosis Date Noted  . Generalized abdominal pain   . Constipation   . Hypercalcemia 06/26/2019  . (HFpEF) heart failure with preserved ejection fraction (HCC) 06/26/2019  . Acute kidney injury superimposed on CKD (HCC) 06/26/2019  . Chronic systolic heart failure (HCC) 04/06/2018  . HTN (hypertension) 04/06/2018  . COPD (chronic obstructive pulmonary disease) (HCC) 04/06/2018  . Atrial fibrillation (HCC) 04/06/2018  . Acute bronchitis 09/17/2015  . Malnutrition of moderate degree 09/11/2015  . Fever 09/08/2015  . Hyponatremia 09/08/2015    Past Surgical History:  Procedure Laterality Date  . ABDOMINAL HYSTERECTOMY    . ATRIAL FIBRILLATION ABLATION  07/20/2013   by Dr Christin FudgeHegland  . AV nodal ablation  11/01/2013   by Dr Christin FudgeHegland, repeated by Dr Wilford GristKoontz  . BREAST BIOPSY Bilateral 1997   negative  . CARDIAC CATHETERIZATION    . CHOLECYSTECTOMY    . HERNIA REPAIR    . PACEMAKER INSERTION  06/2017   MDT Viva CRT-P implanted by Dr Christin FudgeHegland after AV nodal ablation,  LV lead could not be placed     OB History   No obstetric history on file.     Family History  Problem Relation Age of Onset  . Breast cancer Mother 842       3 different times  . Breast cancer Cousin     Social History   Tobacco Use  . Smoking  status: Former Smoker    Quit date: 06/09/1987    Years since quitting: 33.4  . Smokeless tobacco: Never Used  Vaping Use  . Vaping Use: Never used  Substance Use Topics  . Alcohol use: No    Alcohol/week: 0.0 standard drinks  . Drug use: No    Home Medications Prior to Admission medications   Medication Sig Start Date End Date Taking? Authorizing Provider  acetylcysteine (MUCOMYST) 20 % nebulizer solution Take 4 mLs by nebulization 4 (four) times daily.  06/07/19   [provider]  albuterol (PROVENTIL HFA;VENTOLIN HFA) 108 (90 Base) MCG/ACT inhaler Inhale 2 puffs into the lungs every 4 (four) hours as needed for  wheezing or shortness of breath.    [provider]  albuterol (PROVENTIL) (2.5 MG/3ML) 0.083% nebulizer solution Take 2.5 mg by nebulization 4 (four) times daily.    [provider]  azithromycin (ZITHROMAX) 250 MG tablet Take 250 mg by mouth every Monday, Wednesday, and Friday. (at dinnertime)    [provider]  budesonide-formoterol (SYMBICORT) 160-4.5 MCG/ACT inhaler Inhale 2 puffs into the lungs 2 (two) times daily.    [provider]  cholecalciferol (VITAMIN D) 1000 units tablet Take 2,000 Units by mouth at bedtime.    [provider]  colchicine 0.6 MG tablet Take 1 tablet (0.6 mg total) by mouth daily as needed (for gout flares). 06/28/19   Alford Highland, MD  diclofenac sodium (VOLTAREN) 1 % GEL Apply 2 g topically 4 (four) times daily as needed (pain).    [provider]  docusate sodium (COLACE) 100 MG capsule Take 100 mg by mouth daily.     [provider]  febuxostat (ULORIC) 40 MG tablet Take 40 mg by mouth daily. 05/01/19   [provider]  Ferrous Sulfate (IRON) 28 MG TABS Take 28 mg by mouth daily.     [provider]  fluticasone (FLONASE) 50 MCG/ACT nasal spray Place 2 sprays into both nostrils daily.     [provider]  ibandronate (BONIVA) 150 MG tablet Take 150 mg by mouth every 30 (thirty) days. 04/25/19   [provider]  levothyroxine (SYNTHROID, LEVOTHROID) 75 MCG tablet Take 75 mcg by mouth daily before breakfast.     [provider]  Magnesium 250 MG TABS Take 500 mg by mouth every evening.    [provider]  pantoprazole (PROTONIX) 40 MG tablet Take 1 tablet (40 mg total) by mouth daily. 06/29/19   Alford Highland, MD  polyethylene glycol (MIRALAX / GLYCOLAX) 17 g packet Take 17 g by mouth daily as needed for moderate constipation. 06/28/19   Alford Highland, MD  potassium chloride (KLOR-CON) 10 MEQ tablet Take 10 mEq by mouth 2 (two) times daily.  06/05/19   [provider]  predniSONE (DELTASONE) 10 MG tablet Take 10 mg by mouth daily.     [provider]  rivaroxaban (XARELTO) 20 MG TABS tablet Take 20 mg by mouth daily with supper.     [provider]  simethicone (MYLICON) 80 MG chewable tablet Chew 1 tablet (80 mg total) by mouth 4 (four) times daily. 06/28/19   Alford Highland, MD  SPIRIVA RESPIMAT 2.5 MCG/ACT AERS Inhale 2 puffs into the lungs daily. 01/05/18   [provider]  torsemide (DEMADEX) 20 MG tablet Take 20 mg by mouth daily.    [provider]    Allergies    Allopurinol, Clindamycin, Pneumococcal 13-val conj vacc, Dronedarone, Entresto [sacubitril-valsartan], Fosamax [alendronate sodium],  Meperidine, Penicillins, Rosuvastatin, Tetracycline, Adhesive [tape], and Lovastatin  Review of Systems   Review of Systems  Constitutional: Positive for fever. Negative for chills.  HENT: Negative for ear pain and sore throat.   Eyes: Negative for pain and visual disturbance.  Respiratory: Positive for cough (sometimes productive of yellow sputum) and shortness of breath.   Cardiovascular: Positive for chest pain (like someone is squeezing me). Negative for palpitations.  Gastrointestinal: Positive for blood in stool. Negative for abdominal pain and vomiting.       Saw bright red blood this morning while straining to urinate  Genitourinary: Positive for difficulty urinating. Negative for dysuria and hematuria.  Musculoskeletal: Negative for arthralgias and back pain.  Skin: Negative for color change and rash.  Neurological: Negative for seizures and syncope.  All other systems reviewed and are negative.   Physical Exam Updated Vital Signs BP (!) 141/79 (BP Location: Left Arm)   Pulse 83   Temp 99.7 F (37.6 C) (Oral)   Resp (!) 24   Ht 5' 1.5" (1.562 m)   Wt 101.6 kg   SpO2 99%   BMI 41.64 kg/m   Physical Exam Vitals and nursing note reviewed.  Constitutional:       General: She is not in acute distress.    Appearance: She is well-developed.  HENT:     Head: Normocephalic and atraumatic.  Eyes:     Conjunctiva/sclera: Conjunctivae normal.  Cardiovascular:     Rate and Rhythm: Normal rate and regular rhythm.     Heart sounds: No murmur heard.   Pulmonary:     Effort: Tachypnea and respiratory distress present.     Breath sounds: Examination of the right-upper field reveals wheezing and rhonchi. Examination of the left-upper field reveals wheezing and rhonchi. Examination of the right-middle field reveals wheezing and rhonchi. Examination of the left-middle field reveals wheezing and rhonchi. Examination of the right-lower field reveals decreased breath sounds, wheezing and rhonchi. Examination of the left-lower field reveals decreased breath sounds, wheezing and rhonchi. Decreased breath sounds, wheezing and rhonchi present.  Abdominal:     Palpations: Abdomen is soft.     Tenderness: There is no abdominal tenderness.     Comments: Rectal exam normal; no stool in vault; no blood   Musculoskeletal:     Cervical back: Neck supple.  Skin:    General: Skin is warm and dry.  Neurological:     General: No focal deficit present.     Mental Status: She is alert.  Psychiatric:        Mood and Affect: Mood normal.        Behavior: Behavior normal.     ED Results / Procedures / Treatments   Labs (all labs ordered are listed, but only abnormal results are displayed) Labs Reviewed  COMPREHENSIVE METABOLIC PANEL - Abnormal; Notable for the following components:      Result Value   Glucose, Bld 125 (*)    Calcium 8.8 (*)    Total Protein 6.4 (*)    AST 11 (*)    All other components within normal limits  URINALYSIS, ROUTINE W REFLEX MICROSCOPIC - Abnormal; Notable for the following components:   Specific Gravity, Urine >1.030 (*)    Leukocytes,Ua MODERATE (*)    All other components within normal limits  URINALYSIS, MICROSCOPIC (REFLEX) -  Abnormal; Notable for the following components:   Bacteria, UA RARE (*)    All other components within normal limits  RESP PANEL BY RT-PCR (FLU  A&B, COVID) ARPGX2  CBC WITH DIFFERENTIAL/PLATELET  BRAIN NATRIURETIC PEPTIDE  OCCULT BLOOD X 1 CARD TO LAB, STOOL  POC OCCULT BLOOD, ED  TROPONIN I (HIGH SENSITIVITY)  TROPONIN I (HIGH SENSITIVITY)    EKG EKG Interpretation  Date/Time:  Thursday November 07 2020 09:15:15 EDT Ventricular Rate:  80 PR Interval:  231 QRS Duration: 136 QT Interval:  390 QTC Calculation: 450 R Axis:   -75 Text Interpretation: Sinus rhythm Prolonged PR interval Left bundle branch block No acute ischemia Confirmed by Pieter Partridge ([id number]) on 11/07/2020 3:53:18 PM   Radiology CT Chest Wo Contrast  Result Date: 11/07/2020 CLINICAL DATA:  Respiratory illness. EXAM: CT CHEST WITHOUT CONTRAST TECHNIQUE: Multidetector CT imaging of the chest was performed following the standard protocol without IV contrast. COMPARISON:  CTA Chest 09/17/2015 FINDINGS: Cardiovascular: The heart size is normal. No substantial pericardial effusion. Coronary artery calcification is evident. Atherosclerotic calcification is noted in the wall of the thoracic aorta. Left permanent pacemaker noted. Mediastinum/Nodes: No mediastinal lymphadenopathy. No evidence for gross hilar lymphadenopathy although assessment is limited by the lack of intravenous contrast on today's study. The esophagus has normal imaging features. There is no axillary lymphadenopathy. Lungs/Pleura: Centrilobular emphsyema noted. Tiny right middle lobe perifissural nodules on images 84 and 89 of series 4 are stable since 2017 consistent with benign etiology. There is a new 6 mm irregular nodule in the posterior right middle lobe visible on image 92/4. Clip device noted along vasculature of the left lower lobe. Clustered small nodules are seen in the left lower lobe measuring up to 5 mm (image 65/4). Upper Abdomen: 9 mm hypoattenuating lesion  in the lateral segment left liver is similar to prior study consistent with benign etiology. Subtle nodularity of hepatic contour raises the question of underlying cirrhosis. Musculoskeletal: No worrisome lytic or sclerotic osseous abnormality. IMPRESSION: 1. New 6 mm irregular nodule in the posterior right middle lobe. Additional clustered small nodules in the left lower lobe are likely infectious/inflammatory. Non-contrast chest CT at 3-6 months is recommended. If the nodules are stable at time of repeat CT, then future CT at 18-24 months (from today's scan) is considered optional for low-risk patients, but is recommended for high-risk patients. This recommendation follows the consensus statement: Guidelines for Management of Incidental Pulmonary Nodules Detected on CT Images: From the Fleischner Society 2017; Radiology 2017; 284:228-243. 2. Subtle nodularity of hepatic contour raises the question of underlying cirrhosis. 3. Aortic Atherosclerosis (ICD10-I70.0) and Emphysema (ICD10-J43.9). Electronically Signed   By: Kennith Center M.D.   On: 11/07/2020 14:14   DG Chest Port 1 View  Result Date: 11/07/2020 CLINICAL DATA:  75 year old female with cough and shortness of breath. Congestion. EXAM: PORTABLE CHEST 1 VIEW COMPARISON:  Chest radiographs 03/14/2019 and earlier. FINDINGS: Portable AP semi upright view at 0950 hours. Chronic left chest cardiac pacemaker. Stable cardiac size and mediastinal contours. Chronic hypo ventilation at the left lung base. And compared to 2019 radiographs lung markings appear to be stable. No pneumothorax. Visualized tracheal air column is within normal limits. Paucity of bowel gas in the upper abdomen. No acute osseous abnormality identified. IMPRESSION: No convincing acute cardiopulmonary abnormality. Electronically Signed   By: Odessa Fleming M.D.   On: 11/07/2020 09:57    Procedures Procedures   Medications Ordered in ED Medications  ipratropium-albuterol (DUONEB) 0.5-2.5 (3)  MG/3ML nebulizer solution 3 mL (has no administration in time range)    ED Course  I have reviewed the triage vital signs and the nursing notes.  Pertinent labs & imaging results that were available during my care of the patient were reviewed by me and considered in my medical decision making (see chart for details).    MDM Rules/Calculators/A&P                          Ms. Gedeon has chronic lung disease and oxygen requirements at baseline.  She has been struggling with increased cough and shortness of breath.  ED evaluation was undertaken to ensure her symptoms were not related to congestive heart failure, acute coronary syndrome, consolidative pneumonia, and infectious process.  At this point, I think it is most likely an exacerbation of her chronic bronchitis and her inflammatory pneumonia.  She will be given steroids and a short course of antibiotics due to the complexity of her case.  I do not think she requires admission, and she did respond to bronchodilators.  She is looking for a new pulmonologist, and her son will help her with this process.  She also complained of urinary retention which seems to be unrelated to her respiratory illness.  Pre and post void residuals were the same, and a Foley catheter was placed.   Final Clinical Impression(s) / ED Diagnoses Final diagnoses:  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HCC)  Simple chronic bronchitis (HCC)  Urinary retention  Lung nodule    Rx / DC Orders ED Discharge Orders         Ordered    predniSONE (DELTASONE) 20 MG tablet  Daily        11/07/20 1558    azithromycin (ZITHROMAX) 250 MG tablet  Daily        11/07/20 1601    cefUROXime (CEFTIN) 500 MG tablet  2 times daily with meals        11/07/20 1601           Koleen Distance, MD 11/07/20 1606

## 2020-11-08 NOTE — ED Notes (Signed)
Opened chart to follow up with complaint.

## 2020-11-09 ENCOUNTER — Emergency Department (HOSPITAL_COMMUNITY)
Admission: EM | Admit: 2020-11-09 | Discharge: 2020-11-09 | Disposition: A | Discharge status: Home / Self Care | Payer: Medicare Other | Attending: Emergency Medicine | Admitting: Emergency Medicine

## 2020-11-09 ENCOUNTER — Other Ambulatory Visit: Payer: Self-pay

## 2020-11-09 ENCOUNTER — Encounter (HOSPITAL_COMMUNITY): Payer: Self-pay | Admitting: Emergency Medicine

## 2020-11-09 DIAGNOSIS — J449 Chronic obstructive pulmonary disease, unspecified: Secondary | ICD-10-CM | POA: Insufficient documentation

## 2020-11-09 DIAGNOSIS — Z87891 Personal history of nicotine dependence: Secondary | ICD-10-CM | POA: Diagnosis not present

## 2020-11-09 DIAGNOSIS — N3289 Other specified disorders of bladder: Secondary | ICD-10-CM | POA: Diagnosis not present

## 2020-11-09 DIAGNOSIS — Z79899 Other long term (current) drug therapy: Secondary | ICD-10-CM | POA: Diagnosis not present

## 2020-11-09 DIAGNOSIS — I509 Heart failure, unspecified: Secondary | ICD-10-CM | POA: Diagnosis not present

## 2020-11-09 DIAGNOSIS — J45909 Unspecified asthma, uncomplicated: Secondary | ICD-10-CM | POA: Diagnosis not present

## 2020-11-09 DIAGNOSIS — Z7951 Long term (current) use of inhaled steroids: Secondary | ICD-10-CM | POA: Diagnosis not present

## 2020-11-09 DIAGNOSIS — Z950 Presence of cardiac pacemaker: Secondary | ICD-10-CM | POA: Insufficient documentation

## 2020-11-09 DIAGNOSIS — Z7901 Long term (current) use of anticoagulants: Secondary | ICD-10-CM | POA: Diagnosis not present

## 2020-11-09 DIAGNOSIS — I110 Hypertensive heart disease with heart failure: Secondary | ICD-10-CM | POA: Diagnosis not present

## 2020-11-09 MED ORDER — BENZONATATE 100 MG PO CAPS
200.0000 mg | ORAL_CAPSULE | Freq: Once | ORAL | Status: AC
  Administered 2020-11-09: 200 mg via ORAL
  Filled 2020-11-09: qty 2

## 2020-11-09 MED ORDER — FOSFOMYCIN TROMETHAMINE 3 G PO PACK
3.0000 g | PACK | Freq: Once | ORAL | Status: AC
  Administered 2020-11-09: 3 g via ORAL
  Filled 2020-11-09: qty 3

## 2020-11-09 NOTE — ED Triage Notes (Signed)
Patient arrived with EMS from [facility] assisted living facility reports persistent bladder pain for 2 days , denies fever or chills .

## 2020-11-09 NOTE — ED Provider Notes (Signed)
[facility] EMERGENCY DEPARTMENT Provider Note   CSN: [id number] Arrival date & time: 11/09/20  0442     History Chief Complaint  Patient presents with  . Bladder Pain     Kathleen [name] is a 75 y.o. female.  75 yo F with a chief complaint of bladder spasms.  She has been having these ever since she had a Foley catheter placed a couple days ago when she came into the emergency department for urinary retention.  Found to have greater than 600 cc of urine in her bladder.  Since then she has had severe pain especially when she coughs.  She has history of bronchiolitis and had an exacerbation at the same time she had her urinary retention and was started on steroids and had some improvement and was discharged home.  The history is provided by the patient.  Illness Severity:  Moderate Onset quality:  Gradual Duration:  2 days Timing:  Constant Progression:  Worsening Chronicity:  New Associated symptoms: no chest pain, no congestion, no fever, no headaches, no myalgias, no nausea, no rhinorrhea, no shortness of breath, no vomiting and no wheezing        Past Medical History:  Diagnosis Date  . Asthma   . Atypical atrial flutter (HCC)   . 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HCC)   . CHF (congestive heart failure) (HCC)   . Chronic renal insufficiency   . 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 COPD (chronic obstructive pulmonary disease) (HCC)   . Gout   . Hypertension   . Longstanding persistent atrial fibrillation (HCC)   . Nonischemic cardiomyopathy (HCC)   . Obesity   . Pacemaker   . Spontaneous pneumothorax 2013    Patient Active Problem List   Diagnosis Date Noted  . Generalized abdominal pain   . Constipation   . Hypercalcemia 06/26/2019  . (HFpEF) heart failure with preserved ejection fraction (HCC) 06/26/2019  . Acute kidney injury superimposed on CKD (HCC) 06/26/2019  . Chronic systolic heart failure  (HCC) 70/35/0093  . HTN (hypertension) 04/06/2018  . COPD (chronic obstructive pulmonary disease) (HCC) 04/06/2018  . Atrial fibrillation (HCC) 04/06/2018  . Acute bronchitis 09/17/2015  . Malnutrition of moderate degree 09/11/2015  . Fever 09/08/2015  . Hyponatremia 09/08/2015    Past Surgical History:  Procedure Laterality Date  . ABDOMINAL HYSTERECTOMY    . ATRIAL FIBRILLATION ABLATION  07/20/2013   by Dr Christin Fudge  . AV nodal ablation  11/01/2013   by Dr Christin Fudge, repeated by Dr Wilford Grist  . BREAST BIOPSY Bilateral 1997   negative  . CARDIAC CATHETERIZATION    . CHOLECYSTECTOMY    . HERNIA REPAIR    . PACEMAKER INSERTION  06/2017   MDT Viva CRT-P implanted by Dr Christin Fudge after AV nodal ablation,  LV lead could not be placed     OB History   No obstetric history on file.     Family History  Problem Relation Age of Onset  . Breast cancer Mother 73       3 different times  . Breast cancer Cousin     Social History   Tobacco Use  . Smoking status: Former Smoker    Quit date: 06/09/1987    Years since quitting: 33.4  . Smokeless tobacco: Never Used  Vaping Use  . Vaping Use: Never used  Substance Use Topics  . Alcohol use: No    Alcohol/week: 0.0 standard drinks  .  Drug use: No    Home Medications Prior to Admission medications   Medication Sig Start Date End Date Taking? Authorizing Provider  albuterol (PROVENTIL HFA;VENTOLIN HFA) 108 (90 Base) MCG/ACT inhaler Inhale 2 puffs into the lungs every 4 (four) hours as needed for wheezing or shortness of breath.   Yes [provider]  azelastine (ASTELIN) 0.1 % nasal spray Place 2 sprays into both nostrils 2 (two) times daily. 09/06/20  Yes [provider]  azithromycin (ZITHROMAX) 250 MG tablet Take first 2 tablets together on Day 1, then 1 tablet every day until finished. 11/07/20  Yes Koleen Distance, MD  cefUROXime (CEFTIN) 500 MG tablet Take 1 tablet (500 mg total) by mouth 2 (two) times daily with a meal  for 7 days. 11/07/20 11/14/20 Yes Koleen Distance, MD  cholecalciferol (VITAMIN D) 1000 units tablet Take 2,000 Units by mouth at bedtime.   Yes [provider]  colchicine 0.6 MG tablet Take 1 tablet (0.6 mg total) by mouth daily as needed (for gout flares). 06/28/19  Yes Wieting, Richard, MD  cyanocobalamin (,VITAMIN B-12,) 1000 MCG/ML injection Inject 1,000 mcg into the muscle every 30 (thirty) days. 08/07/20  Yes [provider]  docusate sodium (COLACE) 100 MG capsule Take 100 mg by mouth daily.    Yes [provider]  dofetilide (TIKOSYN) 250 MCG capsule Take 250 mcg by mouth in the morning and at bedtime. 08/17/20 08/17/21 Yes [provider]  ENTRESTO 24-26 MG Take 1 tablet by mouth 2 (two) times daily. 10/21/20  Yes [provider]  EPINEPHrine 0.3 mg/0.3 mL IJ SOAJ injection Inject 0.3 mLs into the muscle as needed for anaphylaxis. 05/29/19  Yes [provider]  ezetimibe (ZETIA) 10 MG tablet Take 10 mg by mouth at bedtime. 10/01/20  Yes [provider]  febuxostat (ULORIC) 40 MG tablet Take 40 mg by mouth at bedtime. 05/01/19  Yes [provider]  fluticasone-salmeterol (ADVAIR HFA) 230-21 MCG/ACT inhaler Inhale 2 puffs into the lungs in the morning and at bedtime. 12/05/19 12/04/20 Yes [provider]  levothyroxine (SYNTHROID) 50 MCG tablet Take 50 mcg by mouth daily before breakfast.   Yes [provider]  Magnesium 500 MG TABS Take 500 mg by mouth every evening.   Yes [provider]  NYSTATIN powder Apply 1 application topically 2 (two) times daily as needed (rash). 08/19/20  Yes [provider]  polyethylene glycol (MIRALAX / GLYCOLAX) 17 g packet Take 17 g by mouth daily as needed for moderate constipation. 06/28/19  Yes Wieting, Richard, MD  predniSONE (DELTASONE) 20 MG tablet Take 3 tablets (60 mg total) by mouth daily for 5 days. 11/07/20 11/12/20 Yes Koleen Distance, MD  rivaroxaban (XARELTO)  20 MG TABS tablet Take 20 mg by mouth daily with supper.    Yes [provider]  sodium chloride HYPERTONIC 3 % nebulizer solution Take 4 mLs by nebulization 2 (two) times daily. 11/05/20  Yes [provider]  SPIRIVA RESPIMAT 2.5 MCG/ACT AERS Inhale 2 puffs into the lungs daily. 01/05/18  Yes [provider]  spironolactone (ALDACTONE) 25 MG tablet Take 25 mg by mouth daily. 11/03/20  Yes [provider]  torsemide (DEMADEX) 20 MG tablet Take 20 mg by mouth once a week.   Yes [provider]  pantoprazole (PROTONIX) 40 MG tablet Take 1 tablet (40 mg total) by mouth daily. Patient not taking: No sig reported 06/29/19   Alford Highland, MD  simethicone [facility]) 80  MG chewable tablet Chew 1 tablet (80 mg total) by mouth 4 (four) times daily. Patient not taking: No sig reported 06/28/19   Alford Highland, MD    Allergies    Allopurinol, Clindamycin, Pneumococcal 13-val conj vacc, Dronedarone, Entresto [sacubitril-valsartan], Fosamax [alendronate sodium], Meperidine, Penicillins, Rosuvastatin, Tetracycline, Adhesive [tape], and Lovastatin  Review of Systems   Review of Systems  Constitutional: Negative for chills and fever.  HENT: Negative for congestion and rhinorrhea.   Eyes: Negative for redness and visual disturbance.  Respiratory: Negative for shortness of breath and wheezing.   Cardiovascular: Negative for chest pain and palpitations.  Gastrointestinal: Negative for nausea and vomiting.  Genitourinary: Negative for dysuria and urgency.  Musculoskeletal: Negative for arthralgias and myalgias.  Skin: Negative for pallor and wound.  Neurological: Negative for dizziness and headaches.    Physical Exam Updated Vital Signs BP (!) 120/56   Pulse 71   Temp 98.4 F (36.9 C)   Resp (!) 21   SpO2 98%   Physical Exam Vitals and nursing note reviewed.  Constitutional:      General: She is not in acute distress.    Appearance: She is  well-developed. She is not diaphoretic.  HENT:     Head: Normocephalic and atraumatic.  Eyes:     Pupils: Pupils are equal, round, and reactive to light.  Cardiovascular:     Rate and Rhythm: Normal rate and regular rhythm.     Heart sounds: No murmur heard. No friction rub. No gallop.   Pulmonary:     Effort: Pulmonary effort is normal.     Breath sounds: No wheezing or rales.  Abdominal:     General: There is no distension.     Palpations: Abdomen is soft.     Tenderness: There is no abdominal tenderness.  Musculoskeletal:        General: No tenderness.     Cervical back: Normal range of motion and neck supple.  Skin:    General: Skin is warm and dry.  Neurological:     Mental Status: She is alert and oriented to person, place, and time.  Psychiatric:        Behavior: Behavior normal.     ED Results / Procedures / Treatments   Labs (all labs ordered are listed, but only abnormal results are displayed) Labs Reviewed - No data to display  EKG None  Radiology CT Chest Wo Contrast  Result Date: 11/07/2020 CLINICAL DATA:  Respiratory illness. EXAM: CT CHEST WITHOUT CONTRAST TECHNIQUE: Multidetector CT imaging of the chest was performed following the standard protocol without IV contrast. COMPARISON:  CTA Chest 09/17/2015 FINDINGS: Cardiovascular: The heart size is normal. No substantial pericardial effusion. Coronary artery calcification is evident. Atherosclerotic calcification is noted in the wall of the thoracic aorta. Left permanent pacemaker noted. Mediastinum/Nodes: No mediastinal lymphadenopathy. No evidence for gross hilar lymphadenopathy although assessment is limited by the lack of intravenous contrast on today's study. The esophagus has normal imaging features. There is no axillary lymphadenopathy. Lungs/Pleura: Centrilobular emphsyema noted. Tiny right middle lobe perifissural nodules on images 84 and 89 of series 4 are stable since 2017 consistent with benign etiology.  There is a new 6 mm irregular nodule in the posterior right middle lobe visible on image 92/4. Clip device noted along vasculature of the left lower lobe. Clustered small nodules are seen in the left lower lobe measuring up to 5 mm (image 65/4). Upper Abdomen: 9 mm hypoattenuating lesion in the lateral segment left liver is  similar to prior study consistent with benign etiology. Subtle nodularity of hepatic contour raises the question of underlying cirrhosis. Musculoskeletal: No worrisome lytic or sclerotic osseous abnormality. IMPRESSION: 1. New 6 mm irregular nodule in the posterior right middle lobe. Additional clustered small nodules in the left lower lobe are likely infectious/inflammatory. Non-contrast chest CT at 3-6 months is recommended. If the nodules are stable at time of repeat CT, then future CT at 18-24 months (from today's scan) is considered optional for low-risk patients, but is recommended for high-risk patients. This recommendation follows the consensus statement: Guidelines for Management of Incidental Pulmonary Nodules Detected on CT Images: From the Fleischner Society 2017; Radiology 2017; 284:228-243. 2. Subtle nodularity of hepatic contour raises the question of underlying cirrhosis. 3. Aortic Atherosclerosis (ICD10-I70.0) and Emphysema (ICD10-J43.9). Electronically Signed   By: Kennith Center M.D.   On: 11/07/2020 14:14   DG Chest Port 1 View  Result Date: 11/07/2020 CLINICAL DATA:  75 year old female with cough and shortness of breath. Congestion. EXAM: PORTABLE CHEST 1 VIEW COMPARISON:  Chest radiographs 03/14/2019 and earlier. FINDINGS: Portable AP semi upright view at 0950 hours. Chronic left chest cardiac pacemaker. Stable cardiac size and mediastinal contours. Chronic hypo ventilation at the left lung base. And compared to 2019 radiographs lung markings appear to be stable. No pneumothorax. Visualized tracheal air column is within normal limits. Paucity of bowel gas in the upper  abdomen. No acute osseous abnormality identified. IMPRESSION: No convincing acute cardiopulmonary abnormality. Electronically Signed   By: Odessa Fleming M.D.   On: 11/07/2020 09:57    Procedures Procedures   Medications Ordered in ED Medications - No data to display  ED Course  I have reviewed the triage vital signs and the nursing notes.  Pertinent labs & imaging results that were available during my care of the patient were reviewed by me and considered in my medical decision making (see chart for details).    MDM Rules/Calculators/A&P                          75 yo F with a cc of bladder spasms.  Catheter removed with improvement.  Awaiting trial of void.   The patients results and plan were reviewed and discussed.   Any x-rays performed were independently reviewed by myself.   Differential diagnosis were considered with the presenting HPI.  Medications - No data to display  Vitals:   11/09/20 0446 11/09/20 0600  BP: (!) 126/104 (!) 120/56  Pulse: 73 71  Resp: 17 (!) 21  Temp: 98.4 F (36.9 C)   SpO2: 97% 98%    Final diagnoses:  Bladder spasm      Final Clinical Impression(s) / ED Diagnoses Final diagnoses:  Bladder spasm    Rx / DC Orders ED Discharge Orders    None       Melene Plan, DO 11/09/20 2258

## 2020-11-09 NOTE — ED Provider Notes (Signed)
I received this patient in signout from Dr. Adela Lank.  She had recently had Foley catheter placed for urinary retention and return to the ED due to bladder spasms.  Foley was removed and at time of signout, awaiting voiding trial.  Patient was able to urinate twice in the ED and is comfortable and feels much better on reassessment.  Urine culture sent.  UA from a few days ago shows leukocytes, no severe signs of infection however Dr. Adela Lank had treated for possible infection with fosfomycin in the ED.  Patient already has urology follow-up scheduled.  Return precautions reviewed.   Daylan Boggess, Ambrose Finland, MD 11/09/20 1057

## 2020-11-09 NOTE — ED Notes (Signed)
Up to bedside commode able to urinate without difficulty.

## 2020-11-10 LAB — URINE CULTURE

## 2020-11-14 ENCOUNTER — Emergency Department (HOSPITAL_COMMUNITY): Payer: Medicare Other

## 2020-11-14 ENCOUNTER — Other Ambulatory Visit: Payer: Self-pay

## 2020-11-14 ENCOUNTER — Inpatient Hospital Stay (HOSPITAL_COMMUNITY)
Admission: EM | Admit: 2020-11-14 | Discharge: 2020-11-19 | DRG: 193 | Disposition: A | Discharge status: Skilled Nursing Facility | Payer: Medicare Other | Attending: Internal Medicine | Admitting: Internal Medicine

## 2020-11-14 ENCOUNTER — Encounter (HOSPITAL_COMMUNITY): Payer: Self-pay | Admitting: Emergency Medicine

## 2020-11-14 DIAGNOSIS — Z20822 Contact with and (suspected) exposure to covid-19: Secondary | ICD-10-CM | POA: Diagnosis present

## 2020-11-14 DIAGNOSIS — Z87891 Personal history of nicotine dependence: Secondary | ICD-10-CM | POA: Diagnosis not present

## 2020-11-14 DIAGNOSIS — I4892 Unspecified atrial flutter: Secondary | ICD-10-CM | POA: Diagnosis present

## 2020-11-14 DIAGNOSIS — Z950 Presence of cardiac pacemaker: Secondary | ICD-10-CM

## 2020-11-14 DIAGNOSIS — Z825 Family history of asthma and other chronic lower respiratory diseases: Secondary | ICD-10-CM | POA: Diagnosis not present

## 2020-11-14 DIAGNOSIS — I5042 Chronic combined systolic (congestive) and diastolic (congestive) heart failure: Secondary | ICD-10-CM | POA: Diagnosis present

## 2020-11-14 DIAGNOSIS — I4811 Longstanding persistent atrial fibrillation: Secondary | ICD-10-CM | POA: Diagnosis present

## 2020-11-14 DIAGNOSIS — Z7901 Long term (current) use of anticoagulants: Secondary | ICD-10-CM | POA: Diagnosis not present

## 2020-11-14 DIAGNOSIS — Z6841 Body Mass Index (BMI) 40.0 and over, adult: Secondary | ICD-10-CM

## 2020-11-14 DIAGNOSIS — Z79890 Hormone replacement therapy (postmenopausal): Secondary | ICD-10-CM

## 2020-11-14 DIAGNOSIS — J189 Pneumonia, unspecified organism: Principal | ICD-10-CM | POA: Diagnosis present

## 2020-11-14 DIAGNOSIS — N3289 Other specified disorders of bladder: Secondary | ICD-10-CM | POA: Diagnosis present

## 2020-11-14 DIAGNOSIS — J9621 Acute and chronic respiratory failure with hypoxia: Secondary | ICD-10-CM | POA: Diagnosis present

## 2020-11-14 DIAGNOSIS — Z79899 Other long term (current) drug therapy: Secondary | ICD-10-CM

## 2020-11-14 DIAGNOSIS — Z888 Allergy status to other drugs, medicaments and biological substances status: Secondary | ICD-10-CM | POA: Diagnosis not present

## 2020-11-14 DIAGNOSIS — Z880 Allergy status to penicillin: Secondary | ICD-10-CM

## 2020-11-14 DIAGNOSIS — G4733 Obstructive sleep apnea (adult) (pediatric): Secondary | ICD-10-CM | POA: Diagnosis not present

## 2020-11-14 DIAGNOSIS — J440 Chronic obstructive pulmonary disease with acute lower respiratory infection: Secondary | ICD-10-CM | POA: Diagnosis present

## 2020-11-14 DIAGNOSIS — E662 Morbid (severe) obesity with alveolar hypoventilation: Secondary | ICD-10-CM | POA: Diagnosis present

## 2020-11-14 DIAGNOSIS — J398 Other specified diseases of upper respiratory tract: Secondary | ICD-10-CM | POA: Diagnosis present

## 2020-11-14 DIAGNOSIS — I110 Hypertensive heart disease with heart failure: Secondary | ICD-10-CM | POA: Diagnosis present

## 2020-11-14 DIAGNOSIS — I4819 Other persistent atrial fibrillation: Secondary | ICD-10-CM | POA: Diagnosis not present

## 2020-11-14 DIAGNOSIS — J449 Chronic obstructive pulmonary disease, unspecified: Secondary | ICD-10-CM | POA: Diagnosis not present

## 2020-11-14 DIAGNOSIS — I428 Other cardiomyopathies: Secondary | ICD-10-CM | POA: Diagnosis present

## 2020-11-14 DIAGNOSIS — J9601 Acute respiratory failure with hypoxia: Secondary | ICD-10-CM | POA: Diagnosis not present

## 2020-11-14 LAB — RESP PANEL BY RT-PCR (FLU A&B, COVID) ARPGX2
Influenza A by PCR: NEGATIVE
Influenza B by PCR: NEGATIVE
SARS Coronavirus 2 by RT PCR: NEGATIVE

## 2020-11-14 LAB — CBC
HCT: 42.7 % (ref 36.0–46.0)
Hemoglobin: 13.5 g/dL (ref 12.0–15.0)
MCH: 28.2 pg (ref 26.0–34.0)
MCHC: 31.6 g/dL (ref 30.0–36.0)
MCV: 89.3 fL (ref 80.0–100.0)
Platelets: 208 10*3/uL (ref 150–400)
RBC: 4.78 MIL/uL (ref 3.87–5.11)
RDW: 14.6 % (ref 11.5–15.5)
WBC: 14.9 10*3/uL — ABNORMAL HIGH (ref 4.0–10.5)
nRBC: 0 % (ref 0.0–0.2)

## 2020-11-14 LAB — I-STAT CHEM 8, ED
BUN: 15 mg/dL (ref 8–23)
Calcium, Ion: 1.21 mmol/L (ref 1.15–1.40)
Chloride: 101 mmol/L (ref 98–111)
Creatinine, Ser: 0.8 mg/dL (ref 0.44–1.00)
Glucose, Bld: 120 mg/dL — ABNORMAL HIGH (ref 70–99)
HCT: 38 % (ref 36.0–46.0)
Hemoglobin: 12.9 g/dL (ref 12.0–15.0)
Potassium: 4.2 mmol/L (ref 3.5–5.1)
Sodium: 138 mmol/L (ref 135–145)
TCO2: 29 mmol/L (ref 22–32)

## 2020-11-14 LAB — I-STAT VENOUS BLOOD GAS, ED
Acid-Base Excess: 4 mmol/L — ABNORMAL HIGH (ref 0.0–2.0)
Bicarbonate: 30.1 mmol/L — ABNORMAL HIGH (ref 20.0–28.0)
Calcium, Ion: 1.21 mmol/L (ref 1.15–1.40)
HCT: 39 % (ref 36.0–46.0)
Hemoglobin: 13.3 g/dL (ref 12.0–15.0)
O2 Saturation: 93 %
Potassium: 4.2 mmol/L (ref 3.5–5.1)
Sodium: 139 mmol/L (ref 135–145)
TCO2: 32 mmol/L (ref 22–32)
pCO2, Ven: 51.9 mmHg (ref 44.0–60.0)
pH, Ven: 7.371 (ref 7.250–7.430)
pO2, Ven: 70 mmHg — ABNORMAL HIGH (ref 32.0–45.0)

## 2020-11-14 LAB — HIV ANTIBODY (ROUTINE TESTING W REFLEX): HIV Screen 4th Generation wRfx: NONREACTIVE

## 2020-11-14 LAB — STREP PNEUMONIAE URINARY ANTIGEN: Strep Pneumo Urinary Antigen: NEGATIVE

## 2020-11-14 MED ORDER — SPIRONOLACTONE 25 MG PO TABS
25.0000 mg | ORAL_TABLET | Freq: Every day | ORAL | Status: DC
  Administered 2020-11-15 – 2020-11-18 (×4): 25 mg via ORAL
  Filled 2020-11-14 (×6): qty 1

## 2020-11-14 MED ORDER — SODIUM CHLORIDE 0.9 % IV BOLUS
500.0000 mL | Freq: Once | INTRAVENOUS | Status: AC
  Administered 2020-11-14: 500 mL via INTRAVENOUS

## 2020-11-14 MED ORDER — RAMELTEON 8 MG PO TABS
8.0000 mg | ORAL_TABLET | Freq: Every day | ORAL | Status: DC
  Administered 2020-11-14: 8 mg via ORAL
  Filled 2020-11-14 (×2): qty 1

## 2020-11-14 MED ORDER — PANTOPRAZOLE SODIUM 40 MG PO TBEC
40.0000 mg | DELAYED_RELEASE_TABLET | Freq: Every day | ORAL | Status: DC
  Administered 2020-11-14 – 2020-11-18 (×5): 40 mg via ORAL
  Filled 2020-11-14 (×6): qty 1

## 2020-11-14 MED ORDER — LEVOFLOXACIN IN D5W 750 MG/150ML IV SOLN
750.0000 mg | INTRAVENOUS | Status: DC
Start: 2020-11-15 — End: 2020-11-14

## 2020-11-14 MED ORDER — AZITHROMYCIN 250 MG PO TABS
500.0000 mg | ORAL_TABLET | Freq: Every day | ORAL | Status: DC
  Administered 2020-11-15: 500 mg via ORAL
  Filled 2020-11-14: qty 2

## 2020-11-14 MED ORDER — GUAIFENESIN-DM 100-10 MG/5ML PO SYRP
5.0000 mL | ORAL_SOLUTION | ORAL | Status: DC | PRN
  Administered 2020-11-16 – 2020-11-18 (×6): 5 mL via ORAL
  Filled 2020-11-14 (×7): qty 5

## 2020-11-14 MED ORDER — CYANOCOBALAMIN 1000 MCG/ML IJ SOLN: 1000.0000 ug | INTRAMUSCULAR | Status: DC

## 2020-11-14 MED ORDER — TIOTROPIUM BROMIDE MONOHYDRATE 2.5 MCG/ACT IN AERS: 2.0000 | INHALATION_SPRAY | Freq: Every day | RESPIRATORY_TRACT | Status: DC

## 2020-11-14 MED ORDER — SACUBITRIL-VALSARTAN 24-26 MG PO TABS
1.0000 | ORAL_TABLET | Freq: Two times a day (BID) | ORAL | Status: DC
  Administered 2020-11-14 – 2020-11-18 (×9): 1 via ORAL
  Filled 2020-11-14 (×9): qty 1

## 2020-11-14 MED ORDER — LEVOTHYROXINE SODIUM 50 MCG PO TABS
50.0000 ug | ORAL_TABLET | Freq: Every day | ORAL | Status: DC
  Administered 2020-11-15 – 2020-11-18 (×4): 50 ug via ORAL
  Filled 2020-11-14 (×4): qty 1

## 2020-11-14 MED ORDER — ALBUTEROL SULFATE HFA 108 (90 BASE) MCG/ACT IN AERS
2.0000 | INHALATION_SPRAY | RESPIRATORY_TRACT | Status: DC | PRN
  Administered 2020-11-16 – 2020-11-18 (×3): 2 via RESPIRATORY_TRACT
  Filled 2020-11-14: qty 6.7

## 2020-11-14 MED ORDER — DOFETILIDE 250 MCG PO CAPS
250.0000 ug | ORAL_CAPSULE | Freq: Two times a day (BID) | ORAL | Status: DC
  Administered 2020-11-14 – 2020-11-18 (×9): 250 ug via ORAL
  Filled 2020-11-14 (×11): qty 1

## 2020-11-14 MED ORDER — POLYETHYLENE GLYCOL 3350 17 G PO PACK: 17.0000 g | PACK | Freq: Every day | ORAL | Status: DC | PRN

## 2020-11-14 MED ORDER — BENZONATATE 100 MG PO CAPS
100.0000 mg | ORAL_CAPSULE | Freq: Three times a day (TID) | ORAL | Status: DC | PRN
  Administered 2020-11-14 – 2020-11-16 (×5): 100 mg via ORAL
  Filled 2020-11-14 (×5): qty 1

## 2020-11-14 MED ORDER — SODIUM CHLORIDE 0.9 % IV SOLN
2.0000 g | INTRAVENOUS | Status: DC
  Administered 2020-11-15: 2 g via INTRAVENOUS
  Filled 2020-11-14: qty 2

## 2020-11-14 MED ORDER — PREDNISONE 5 MG PO TABS
7.5000 mg | ORAL_TABLET | Freq: Every day | ORAL | Status: DC
  Administered 2020-11-15 – 2020-11-18 (×4): 7.5 mg via ORAL
  Filled 2020-11-14 (×5): qty 1

## 2020-11-14 MED ORDER — EZETIMIBE 10 MG PO TABS
10.0000 mg | ORAL_TABLET | Freq: Every day | ORAL | Status: DC
  Administered 2020-11-14 – 2020-11-18 (×5): 10 mg via ORAL
  Filled 2020-11-14 (×5): qty 1

## 2020-11-14 MED ORDER — FLUTICASONE FUROATE-VILANTEROL 200-25 MCG/INH IN AEPB
1.0000 | INHALATION_SPRAY | Freq: Every day | RESPIRATORY_TRACT | Status: DC
  Administered 2020-11-15 – 2020-11-18 (×4): 1 via RESPIRATORY_TRACT
  Filled 2020-11-14: qty 28

## 2020-11-14 MED ORDER — LEVOFLOXACIN IN D5W 750 MG/150ML IV SOLN
750.0000 mg | Freq: Once | INTRAVENOUS | Status: AC
  Administered 2020-11-14: 750 mg via INTRAVENOUS
  Filled 2020-11-14: qty 150

## 2020-11-14 MED ORDER — IPRATROPIUM-ALBUTEROL 0.5-2.5 (3) MG/3ML IN SOLN
3.0000 mL | RESPIRATORY_TRACT | Status: DC | PRN
  Administered 2020-11-14 – 2020-11-16 (×3): 3 mL via RESPIRATORY_TRACT
  Filled 2020-11-14 (×3): qty 3

## 2020-11-14 MED ORDER — RIVAROXABAN 20 MG PO TABS
20.0000 mg | ORAL_TABLET | Freq: Every day | ORAL | Status: DC
  Administered 2020-11-14 – 2020-11-18 (×5): 20 mg via ORAL
  Filled 2020-11-14 (×5): qty 1

## 2020-11-14 MED ORDER — PHENOL 1.4 % MT LIQD
1.0000 | OROMUCOSAL | Status: DC | PRN
  Administered 2020-11-14 – 2020-11-15 (×2): 1 via OROMUCOSAL
  Filled 2020-11-14: qty 177

## 2020-11-14 MED ORDER — UMECLIDINIUM BROMIDE 62.5 MCG/INH IN AEPB
1.0000 | INHALATION_SPRAY | Freq: Every day | RESPIRATORY_TRACT | Status: DC
  Administered 2020-11-15 – 2020-11-18 (×4): 1 via RESPIRATORY_TRACT
  Filled 2020-11-14: qty 7

## 2020-11-14 NOTE — ED Notes (Signed)
Pharm tech at BS ?

## 2020-11-14 NOTE — ED Notes (Signed)
Pt up to b/r by stedy on 4L O2

## 2020-11-14 NOTE — Progress Notes (Signed)
Pharmacy Antibiotic Note  Kathleen [name] is a 75 y.o. female admitted on 11/14/2020 with pneumonia.  Pharmacy has been consulted for Ceftriaxone and azithromycin dosing.   Patient with increased work of breathing, leukocytosis. Diagnosed with recurrent CAP.  Patient received one dose of levofloxacin IV, pharmacy consulted to assess penicillin allergy and switch to CTX + azithro. Patient meets criteria of non-severe penicillin allergy (see allergy tab), so switched antibiotics.   Plan: CTX 2g q24hr Azithromycin 500mg  once daily  Height: 5' 1.5" (156.2 cm) Weight: 101.6 kg (223 lb 15.8 oz) IBW/kg (Calculated) : 48.95  No data recorded.  Recent Labs  Lab 11/14/20 1055 11/14/20 1116  WBC 14.9*  --   CREATININE  --  0.80    Estimated Creatinine Clearance: 69.2 mL/min (by C-G formula based on SCr of 0.8 mg/dL).    Allergies  Allergen Reactions   Allopurinol Other (See Comments)    Reaction:  Dizziness    Clindamycin Anaphylaxis and Hives   Pneumococcal 13-Val Conj Vacc Itching, Swelling and Rash   Dronedarone Rash   Entresto [Sacubitril-Valsartan] Other (See Comments)    hypotension   Fosamax [Alendronate Sodium] Nausea Only   Meperidine Nausea And Vomiting   Penicillins Hives and Other (See Comments)    Has patient had a PCN reaction causing immediate rash, facial/tongue/throat swelling, SOB or lightheadedness with hypotension: No Has patient had a PCN reaction causing severe rash involving mucus membranes or skin necrosis: No Has patient had a PCN reaction that required hospitalization No Has patient had a PCN reaction occurring within the last 10 years: No If all of the above answers are "NO", then may proceed with Cephalosporin use.   Rosuvastatin Other (See Comments)    Reaction:  Muscle spasms    Tetracycline Hives   Adhesive [Tape] Rash   Lovastatin Rash    Other reaction(s): Muscle Pain    Antimicrobials this admission: 6/9 levofloxacin x1 CTX 6/10 >> Azithro  6/10 >>  Dose adjustments this admission: NA  Microbiology results: N/A  Thank you for allowing pharmacy to be a part of this patient's care.  8/9, PharmD PGY1 Pharmacy Resident 11/14/2020 5:08 PM

## 2020-11-14 NOTE — ED Triage Notes (Addendum)
Patient here for evaluation of of cough. States she went to her pulmonologist in [facility] yesterday and was told to go to the ED at [facility] for treatment of bronchitis that was not responding to outpatient treatment. Patient states she waited nine hours without being seen so she left. Patient here for same complaint. Patient alert, oriented, and in no apparent distress at this time.

## 2020-11-14 NOTE — Plan of Care (Signed)

## 2020-11-14 NOTE — ED Notes (Signed)
Pt alert, NAD, calm, interactive, resps e/ mildly labored, speaking in partial sentences. Mentions chest soreness from coughing, sob, occ minimal nausea. Wants something for cough. VSS. Denies questions or needs. Admitting MD into room at this time.

## 2020-11-14 NOTE — H&P (Signed)
Date: 11/14/2020               Patient Name:  Kathleen [name] MRN: [id number]  DOB: September 18, 1945 Age / Sex: 75 y.o., female   PCP: Marguarite Arbour, MD         Medical Service: Internal Medicine Teaching Service         Attending Physician: Dr. Earl Lagos, MD    First Contact: Dr. Evie Lacks Pager: [phone]  Second Contact: Dr. 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 She was in her usual state of health until 2 weeks ago when she began to have insidious onset productive cough with yellow, dark mucous and dyspnea at rest. She is on 3L at home intermittently but needed to increase her oxygen to 5L. She also used her nebulizer at home without relief although she mentions she only had hypertonic saline available. She mentions she may have had possible sick contact as other residents of her senior home facility has been ill recently. She was seen at urgent care center and was discharged with 5 days of azithromycin and 60mg  of prednisone for COPD exacerbation without significant improvement. She mentions her symptoms did not improve and she was also sent for 7 day course of cefuroxime, which she has been taking as prescribed. She mentions her symptoms continued and she went to her outpatient pulmonology office at [facility] and was told to go to ED for admission. However, she left without being seen due to long wait at [facility] Emergency Department and came to [facility].  Chart review confirms recent visit to urgent care and [facility] pulmonology. CT chest on 11/07/20 showed posterior right middle lobe nodule at the time as well as left lower lobe  nodules concerning for infections.    Meds:  Current Meds  Medication Sig   albuterol (PROVENTIL HFA;VENTOLIN HFA) 108 (90 Base) MCG/ACT inhaler Inhale 2 puffs into the lungs every 4 (four) hours as needed for wheezing or shortness of breath.   azelastine (ASTELIN) 0.1 % nasal spray Place 2 sprays into both nostrils 2 (two) times daily.   Cholecalciferol (VITAMIN D3) 50 MCG (2000 UT) TABS Take 2,000 Units by mouth daily.   cyanocobalamin (,VITAMIN B-12,) 1000 MCG/ML injection Inject 1,000 mcg into the muscle every 30 (thirty) days.   docusate sodium (COLACE) 100 MG capsule Take 100 mg by mouth daily.    dofetilide (TIKOSYN) 250 MCG capsule Take 250 mcg by mouth in the morning and at bedtime.   ENTRESTO 24-26 MG Take 1 tablet by mouth 2 (two) times daily.   EPINEPHrine 0.3 mg/0.3 mL IJ SOAJ injection Inject 0.3 mLs into the muscle as needed for anaphylaxis.   ezetimibe (ZETIA) 10 MG tablet Take 10 mg by mouth at bedtime.   febuxostat (ULORIC) 40 MG tablet Take 40 mg by mouth at bedtime.   fluticasone-salmeterol (ADVAIR HFA) 230-21 MCG/ACT inhaler Inhale 2 puffs into the lungs in the morning and at bedtime.   levothyroxine (SYNTHROID) 50 MCG tablet Take 50 mcg by mouth daily before breakfast.   Magnesium 500 MG TABS Take 500 mg by mouth every evening.  NYSTATIN powder Apply 1 application topically 2 (two) times daily as needed (rash).   polyethylene glycol (MIRALAX / GLYCOLAX) 17 g packet Take 17 g by mouth daily as needed for moderate constipation.   PREDNISONE PO Take 7.5 mg by mouth daily.   rivaroxaban (XARELTO) 20 MG TABS tablet Take 20 mg by mouth daily with supper.    sodium chloride HYPERTONIC 3 % nebulizer solution Take 4 mLs by nebulization 2 (two) times daily.   SPIRIVA RESPIMAT 2.5 MCG/ACT AERS Inhale 2 puffs into the lungs daily.   spironolactone (ALDACTONE) 25 MG tablet Take 25 mg by mouth daily.   torsemide (DEMADEX) 20 MG tablet Take 20 mg by mouth once a week.    Allergies: Allergies as of 11/14/2020 - Review Complete 11/14/2020  Allergen Reaction Noted   Allopurinol Other (See Comments) 09/08/2015   Clindamycin Anaphylaxis and Hives 10/29/2014   Pneumococcal 13-val conj vacc Itching, Swelling, and Rash 11/21/2014   Dronedarone Rash 10/29/2014   Entresto [sacubitril-valsartan] Other (See Comments) 06/07/2018   Fosamax [alendronate sodium] Nausea Only 01/25/2019   Meperidine Nausea And Vomiting 10/29/2014   Penicillins Hives and Other (See Comments) 10/29/2014   Rosuvastatin Other (See Comments) 10/29/2014   Tetracycline Hives 10/29/2014   Adhesive [tape] Rash 09/17/2015   Lovastatin Rash 05/23/2013   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HCC)    CHF (congestive heart failure) (HCC)    Chronic renal insufficiency    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    COPD (chronic obstructive pulmonary disease) (HCC)    Gout    Hypertension    Longstanding persistent atrial fibrillation (HCC)    Nonischemic cardiomyopathy (HCC)    Obesity    Pacemaker    Spontaneous pneumothorax 2013   Family History:  Mother had breast and pancreatic cancer Father had emphysema  Social History: Lives at senior living facility. Denies any alcohol, illicit substance use. Used to smoke 1 ppd for 20 years until the 90s.   Review of Systems: A complete ROS was negative except as per HPI.   Physical Exam: Blood pressure (!) 125/52, pulse 76, resp. rate (!) 23, height 5' 1.5" (1.562 m), weight 101.6 kg, SpO2 99 %.  Gen: Well-developed, ill-appearing HEENT: NCAT head, hearing intact, EOMI, MMM Neck: supple, ROM intact CV: RRR, S1, S2 normal Pulm: Bibasilar rales L>R, frequent coughs,  Abd: Soft, BS+, NTND, No rebound, no guarding Extm: ROM intact, Peripheral pulses intact, Trace ankle edema Skin: Dry, Warm, normal turgor, no rashes, lesions,  wounds.  Neuro: AAOx3  EKG: personally reviewed my interpretation is + left-bundle block, no ischemic changes  CXR: personally reviewed my interpretation is no pleural effusion, + left lower lobe opacity.  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 X-ray with LLL opacity consistent with pneumonia. Failed outpatient oral therapy with 5 days of azithromycin, 6 days of cefuroxime as well as 5 days of high dose steroids for COPD exacerbation. With worsening oxygen requirement, will need admission for IV antibiotics and monitoring. Received 1 dose  of Levaquin in ED for possible allergy - C/w Levaquin (Switch to azithromycin, ceftriaxone after pharmacy review) - Continuous pulse ox - Robitussin, tessalon for cough - C/w home prednisone dose, spiriva, advair - Trend cbc  OHS/OSA Combined systolic diastolic heart failure Mentions uses BIPAP nightly. Does not appear volume overloaded.  - Bipap qhs - Monitor I/Os, Daily weights - C/w home meds: spironolactone 25mg  daily, Entresto 24-26mg  BID  Persistent A.fib HR 71. CHADSVASC2 score of 4. On dofetilide . CONFIRMED LAST DOSE 11/13/20 PM. Only missed 6/9 am dose - Telemetry - C/w home meds: Xarelto 20mg , dofetilide 8/9 - Need to ensure dofetilide administration tonight or will have to do re-initiation for dofetilide  DVT prophx: xarelto Diet: Cardiac Bowel: miralax Code: Full  Prior to Admission Living Arrangement: Home Anticipated Discharge Location: SNF Barriers to Discharge: Medical tx  Dispo: Admit patient to  Inpatient with expected length of stay greater than 2 midnights.  Signed: , MD 11/14/2020, 5:10 PM Pager: [phone] After 5pm on weekdays and 1pm on weekends: On Call Pager: [phone]

## 2020-11-14 NOTE — ED Provider Notes (Signed)
[facility] EMERGENCY DEPARTMENT Provider Note   CSN: [id number] Arrival date & time: 11/14/20  0915     History Chief Complaint  Patient presents with   Cough    Kathleen [name] is a 75 y.o. female.  HPI 75 year old female chronic lung disease secondary to bleed, COPD, CHF, atrial flutter, anticoagulation, presents today complaining of cough and dyspnea.  Patient states she began having symptoms approximately week ago.  She was seen at [facility] ED at that time.  She was started on Ceftin, Zithromax, and prednisone.  She has continued to have cough and dyspnea.  She was seen by her pulmonologist doctor yesterday in [place].  She was told that she needed to be admitted to the hospital.  She went to [facility] and waited 9 hours to be seen, at that point, she decided to come to the emergency department here in [place] where she lives.  She endorses some intermittent subjective fever and chills.  She has ongoing dyspnea and has had increased oxygen requirement from her baseline which she usually only uses at night.  She reports that she has had productive cough that she has had increasing cough with decreased mucus production over the past couple of days.  She has been taking all of her medications as prescribed. She has had 2 COVID vaccines and 2 boosters.    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HCC)    CHF (congestive heart failure) (HCC)    Chronic renal insufficiency    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    COPD (chronic obstructive pulmonary disease) (HCC)    Gout    Hypertension    Longstanding persistent atrial fibrillation (HCC)    Nonischemic cardiomyopathy (HCC)    Obesity    Pacemaker    Spontaneous pneumothorax 2013    Patient Active Problem List   Diagnosis Date Noted   Generalized abdominal pain    Constipation     Hypercalcemia 06/26/2019   (HFpEF) heart failure with preserved ejection fraction (HCC) 06/26/2019   Acute kidney injury superimposed on CKD (HCC) 06/26/2019   Chronic systolic heart failure (HCC) 04/06/2018   HTN (hypertension) 04/06/2018   COPD (chronic obstructive pulmonary disease) (HCC) 04/06/2018   Atrial fibrillation (HCC) 04/06/2018   Acute bronchitis 09/17/2015   Malnutrition of moderate degree 09/11/2015   Fever 09/08/2015   Hyponatremia 09/08/2015    Past Surgical History:  Procedure Laterality Date   ABDOMINAL HYSTERECTOMY     ATRIAL FIBRILLATION ABLATION  07/20/2013   by Dr Christin Fudge   AV nodal ablation  11/01/2013   by Dr Christin Fudge, repeated by Dr Wilford Grist   BREAST BIOPSY Bilateral 1997   negative   CARDIAC CATHETERIZATION     CHOLECYSTECTOMY     HERNIA REPAIR     PACEMAKER INSERTION  06/2017   MDT Viva CRT-P implanted by Dr Christin Fudge after AV nodal ablation,  LV lead could not be placed     OB History   No obstetric history on file.     Family History  Problem Relation Age of Onset   Breast cancer Mother 1       3 different times   Breast cancer Cousin     Social History   Tobacco Use   Smoking status: Former    Pack years: 0.00    Types: Cigarettes    Quit date:  06/09/1987    Years since quitting: 33.4   Smokeless tobacco: Never  Vaping Use   Vaping Use: Never used  Substance Use Topics   Alcohol use: No    Alcohol/week: 0.0 standard drinks   Drug use: No    Home Medications Prior to Admission medications   Medication Sig Start Date End Date Taking? Authorizing Provider  albuterol (PROVENTIL HFA;VENTOLIN HFA) 108 (90 Base) MCG/ACT inhaler Inhale 2 puffs into the lungs every 4 (four) hours as needed for wheezing or shortness of breath.    [provider]  azelastine (ASTELIN) 0.1 % nasal spray Place 2 sprays into both nostrils 2 (two) times daily. 09/06/20   [provider]  azithromycin (ZITHROMAX) 250 MG tablet Take first 2  tablets together on Day 1, then 1 tablet every day until finished. 11/07/20   Koleen Distance, MD  cefUROXime (CEFTIN) 500 MG tablet Take 1 tablet (500 mg total) by mouth 2 (two) times daily with a meal for 7 days. 11/07/20 11/14/20  Koleen Distance, MD  cholecalciferol (VITAMIN D) 1000 units tablet Take 2,000 Units by mouth at bedtime.    [provider]  colchicine 0.6 MG tablet Take 1 tablet (0.6 mg total) by mouth daily as needed (for gout flares). 06/28/19   Alford Highland, MD  cyanocobalamin (,VITAMIN B-12,) 1000 MCG/ML injection Inject 1,000 mcg into the muscle every 30 (thirty) days. 08/07/20   [provider]  docusate sodium (COLACE) 100 MG capsule Take 100 mg by mouth daily.     [provider]  dofetilide (TIKOSYN) 250 MCG capsule Take 250 mcg by mouth in the morning and at bedtime. 08/17/20 08/17/21  [provider]  ENTRESTO 24-26 MG Take 1 tablet by mouth 2 (two) times daily. 10/21/20   [provider]  EPINEPHrine 0.3 mg/0.3 mL IJ SOAJ injection Inject 0.3 mLs into the muscle as needed for anaphylaxis. 05/29/19   [provider]  ezetimibe (ZETIA) 10 MG tablet Take 10 mg by mouth at bedtime. 10/01/20   [provider]  febuxostat (ULORIC) 40 MG tablet Take 40 mg by mouth at bedtime. 05/01/19   [provider]  fluticasone-salmeterol (ADVAIR HFA) 230-21 MCG/ACT inhaler Inhale 2 puffs into the lungs in the morning and at bedtime. 12/05/19 12/04/20  [provider]  levothyroxine (SYNTHROID) 50 MCG tablet Take 50 mcg by mouth daily before breakfast.    [provider]  Magnesium 500 MG TABS Take 500 mg by mouth every evening.    [provider]  NYSTATIN powder Apply 1 application topically 2 (two) times daily as needed (rash). 08/19/20   [provider]  pantoprazole (PROTONIX) 40 MG tablet Take 1 tablet (40 mg total) by mouth daily. Patient not taking: No sig reported 06/29/19   Alford Highland,  MD  polyethylene glycol (MIRALAX / GLYCOLAX) 17 g packet Take 17 g by mouth daily as needed for moderate constipation. 06/28/19   Alford Highland, MD  rivaroxaban (XARELTO) 20 MG TABS tablet Take 20 mg by mouth daily with supper.     [provider]  simethicone (MYLICON) 80 MG chewable tablet Chew 1 tablet (80 mg total) by mouth 4 (four) times daily. Patient not taking: No sig reported 06/28/19   Alford Highland, MD  sodium chloride HYPERTONIC 3 % nebulizer solution Take 4 mLs by nebulization 2 (two) times daily. 11/05/20   [provider]  SPIRIVA RESPIMAT 2.5 MCG/ACT AERS Inhale 2 puffs into the lungs daily. 01/05/18  [provider]  spironolactone (ALDACTONE) 25 MG tablet Take 25 mg by mouth daily. 11/03/20   [provider]  torsemide (DEMADEX) 20 MG tablet Take 20 mg by mouth once a week.    [provider]    Allergies    Allopurinol, Clindamycin, Pneumococcal 13-val conj vacc, Dronedarone, Entresto [sacubitril-valsartan], Fosamax [alendronate sodium], Meperidine, Penicillins, Rosuvastatin, Tetracycline, Adhesive [tape], and Lovastatin  Review of Systems   Review of Systems  Constitutional:  Positive for activity change, appetite change and fever.  HENT:  Positive for congestion.   Eyes: Negative.   Respiratory:  Positive for cough and shortness of breath.   Cardiovascular: Negative.   Gastrointestinal:  Positive for nausea.  Endocrine: Negative.   Genitourinary:  Positive for difficulty urinating.  Musculoskeletal: Negative.   Skin: Negative.   Allergic/Immunologic: Negative.   Neurological: Negative.   Hematological: Negative.   Psychiatric/Behavioral: Negative.    All other systems reviewed and are negative.  Physical Exam Updated Vital Signs BP 113/64   Pulse 91   Resp (!) 21   Ht 1.562 m (5' 1.5")   Wt 101.6 kg   SpO2 95%   BMI 41.64 kg/m   Physical Exam Vitals and nursing note reviewed.  Constitutional:       Appearance: Normal appearance. She is obese.  HENT:     Head: Normocephalic and atraumatic.     Nose: Nose normal.     Mouth/Throat:     Mouth: Mucous membranes are dry.     Pharynx: Oropharynx is clear.  Cardiovascular:     Rate and Rhythm: Normal rate.  Pulmonary:     Breath sounds: Rales present.     Comments: Rales left base Left chest wall with pacemaker in place with no signs or symptoms of infection.  No redness, tenderness, or discharge Abdominal:     General: Abdomen is flat.     Palpations: Abdomen is soft.  Musculoskeletal:        General: Normal range of motion.     Cervical back: Normal range of motion.  Skin:    General: Skin is warm.     Capillary Refill: Capillary refill takes less than 2 seconds.  Neurological:     Mental Status: She is alert.  Psychiatric:        Mood and Affect: Mood normal.    ED Results / Procedures / Treatments   Labs (all labs ordered are listed, but only abnormal results are displayed) Labs Reviewed  CBC  BLOOD GAS, VENOUS  I-STAT CHEM 8, ED    EKG EKG Interpretation  Date/Time:  Thursday November 14 2020 11:29:00 EDT Ventricular Rate:  75 PR Interval:  225 QRS Duration: 128 QT Interval:  407 QTC Calculation: 455 R Axis:   -88 Text Interpretation: Sinus rhythm Prolonged PR interval Nonspecific IVCD with LAD Anterolateral infarct, old Confirmed by Margarita Grizzle [id number]) on 11/14/2020 12:02:47 PM  Radiology DG Chest 2 View  Result Date: 11/14/2020 CLINICAL DATA:  Shortness of breath.  Cough, and fever EXAM: CHEST - 2 VIEW COMPARISON:  11/07/2020 FINDINGS: Left chest wall pacer is noted with leads in the right atrial appendage and right ventricle. Heart size appears normal. Compared with the previous exam there is been interval increase in opacification to the left lung base concerning for airspace disease. Right lung is clear. No signs of pleural effusion or edema. IMPRESSION: Left lower lobe airspace opacity compatible with  pneumonia. Electronically Signed   By: Veronda Prude.D.  On: 11/14/2020 10:09    Procedures Procedures   Medications Ordered in ED Medications  sodium chloride 0.9 % bolus 500 mL (has no administration in time range)  levofloxacin (LEVAQUIN) IVPB 750 mg (has no administration in time range)    ED Course  I have reviewed the triage vital signs and the nursing notes.  Pertinent labs & imaging results that were available during my care of the patient were reviewed by me and considered in my medical decision making (see chart for details).    MDM Rules/Calculators/A&P                          75 year old female with chronic lung disease presents today with failed outpatient treatment of cough with dyspnea.  She has a new left lower lobe infiltrate on chest x-Jiro Kiester.  Patient has penicillin allergy.  She is given Levaquin here in the ED. Discussed with Dr. Erich Montane, on for internal medicine teaching service.  He will see and evaluate patient for admission. Final Clinical Impression(s) / ED Diagnoses Final diagnoses:  Community acquired pneumonia of left lower lobe of lung    Rx / DC Orders ED Discharge Orders     None        Margarita Grizzle, MD 11/14/20 1217

## 2020-11-15 DIAGNOSIS — J189 Pneumonia, unspecified organism: Principal | ICD-10-CM

## 2020-11-15 DIAGNOSIS — J9601 Acute respiratory failure with hypoxia: Secondary | ICD-10-CM

## 2020-11-15 LAB — RESPIRATORY PANEL BY PCR

## 2020-11-15 LAB — CBC
HCT: 40.8 % (ref 36.0–46.0)
Hemoglobin: 12.9 g/dL (ref 12.0–15.0)
MCH: 28.3 pg (ref 26.0–34.0)
MCHC: 31.6 g/dL (ref 30.0–36.0)
MCV: 89.5 fL (ref 80.0–100.0)
Platelets: 207 10*3/uL (ref 150–400)
RBC: 4.56 MIL/uL (ref 3.87–5.11)
RDW: 14.7 % (ref 11.5–15.5)
WBC: 9.8 10*3/uL (ref 4.0–10.5)
nRBC: 0 % (ref 0.0–0.2)

## 2020-11-15 LAB — BASIC METABOLIC PANEL
Anion gap: 10 (ref 5–15)
BUN: 15 mg/dL (ref 8–23)
CO2: 25 mmol/L (ref 22–32)
Calcium: 9.1 mg/dL (ref 8.9–10.3)
Chloride: 100 mmol/L (ref 98–111)
Creatinine, Ser: 0.92 mg/dL (ref 0.44–1.00)
GFR, Estimated: >60 mL/min (ref >60)
Glucose, Bld: 160 mg/dL — ABNORMAL HIGH (ref 70–99)
Potassium: 4 mmol/L (ref 3.5–5.1)
Sodium: 135 mmol/L (ref 135–145)

## 2020-11-15 MED ORDER — IPRATROPIUM-ALBUTEROL 0.5-2.5 (3) MG/3ML IN SOLN
3.0000 mL | Freq: Two times a day (BID) | RESPIRATORY_TRACT | Status: DC
  Administered 2020-11-15 (×2): 3 mL via RESPIRATORY_TRACT
  Filled 2020-11-15 (×4): qty 3

## 2020-11-15 MED ORDER — AMOXICILLIN-POT CLAVULANATE 875-125 MG PO TABS: 1.0000 | ORAL_TABLET | Freq: Two times a day (BID) | ORAL | Status: DC

## 2020-11-15 MED ORDER — MELATONIN 3 MG PO TABS
3.0000 mg | ORAL_TABLET | Freq: Every day | ORAL | Status: DC
  Administered 2020-11-15 – 2020-11-18 (×4): 3 mg via ORAL
  Filled 2020-11-15 (×4): qty 1

## 2020-11-15 NOTE — Evaluation (Signed)
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 Admitted 6/9 with dx of community acquired pneumonia.   Clinical Impression  Pt admitted with above diagnosis. PTA pt resided in ILF, mod I mobility using rollator for community amb and no AD in her apt. On eval, she required supervision bed mobility, min guard assist transfers, and min/HHA ambulation 25'. Distance limited by fatigue. Pt on 2L continuous O2 with SpO2 > 90%. Pt currently with functional limitations due to the deficits listed below (see PT Problem List). Pt will benefit from skilled PT to increase their independence and safety with mobility to allow discharge to the venue listed below.  Recommend ST SNF at d/c due to pt without available assist to manage iADLs and inability to walk the required increased distance to/from ILF dining room for meals.      Follow Up Recommendations SNF    Equipment Recommendations  None recommended by PT    Recommendations for Other Services       Precautions / Restrictions Precautions Precautions: Fall;Other (comment) Precaution Comments: watch sats      Mobility  Bed Mobility Overal bed mobility: Needs Assistance Bed Mobility: Supine to Sit     Supine to sit: Supervision;HOB elevated     General bed mobility comments: supervision for safety    Transfers Overall transfer level: Needs assistance Equipment used: 1 person hand held assist Transfers: Sit to/from Stand;Stand Pivot Transfers Sit to Stand: Min guard Stand pivot transfers: Min guard       General transfer comment: increased time, min guard for safety  Ambulation/Gait Ambulation/Gait assistance: Min assist Gait  Distance (Feet): 25 Feet Assistive device: 1 person hand held assist Gait Pattern/deviations: Step-through pattern;Decreased stride length Gait velocity: decreased   General Gait Details: amb on 2L with SpO2 92%. Fatigues quickly.  Stairs            Wheelchair Mobility    Modified Rankin (Stroke Patients Only)       Balance Overall balance assessment: Needs assistance Sitting-balance support: No upper extremity supported;Feet supported Sitting balance-Leahy Scale: Good     Standing balance support: Single extremity supported;No upper extremity supported;During functional activity Standing balance-Leahy Scale: Fair Standing balance comment: static stand without UE support. HHA amb                             Pertinent Vitals/Pain Pain Assessment: No/denies pain    Home Living Family/patient expects to be discharged to:: Other (Comment) (independent living)                      Prior Function Level of Independence: Independent;Independent with assistive device(s)         Comments: rollator in community, no AD in home, drives     Hand Dominance        Extremity/Trunk Assessment   Upper Extremity Assessment Upper Extremity Assessment: Defer to OT evaluation    Lower Extremity Assessment Lower Extremity Assessment: Generalized weakness    Cervical / Trunk Assessment Cervical / Trunk Assessment: Normal  Communication   Communication: No difficulties  Cognition Arousal/Alertness: Awake/alert Behavior During Therapy: WFL for tasks assessed/performed Overall Cognitive Status: Within Functional  Limits for tasks assessed                                        General Comments General comments (skin integrity, edema, etc.): SpO2 > 90% on 2L    Exercises     Assessment/Plan    PT Assessment Patient needs continued PT services  PT Problem List Decreased strength;Decreased mobility;Decreased activity  tolerance;Decreased balance;Cardiopulmonary status limiting activity       PT Treatment Interventions Therapeutic activities;Gait training;Therapeutic exercise;Patient/family education;Balance training;Functional mobility training    PT Goals (Current goals can be found in the Care Plan section)  Acute Rehab PT Goals Patient Stated Goal: feel better PT Goal Formulation: With patient Time For Goal Achievement: 11/29/20 Potential to Achieve Goals: Good    Frequency Min 3X/week   Barriers to discharge Decreased caregiver support      Co-evaluation               AM-PAC PT "6 Clicks" Mobility  Outcome Measure Help needed turning from your back to your side while in a flat bed without using bedrails?: None Help needed moving from lying on your back to sitting on the side of a flat bed without using bedrails?: A Little Help needed moving to and from a bed to a chair (including a wheelchair)?: A Little Help needed standing up from a chair using your arms (e.g., wheelchair or bedside chair)?: A Little Help needed to walk in hospital room?: A Little Help needed climbing 3-5 steps with a railing? : A Lot 6 Click Score: 18    End of Session Equipment Utilized During Treatment: Gait belt;Oxygen Activity Tolerance: Patient limited by fatigue Patient left: in chair;with call bell/phone within reach Nurse Communication: Mobility status PT Visit Diagnosis: Difficulty in walking, not elsewhere classified (R26.2);Muscle weakness (generalized) (M62.81)    Time: 7322-0254 PT Time Calculation (min) (ACUTE ONLY): 29 min   Charges:   PT Evaluation $PT Eval Moderate Complexity: 1 Mod PT Treatments $Gait Training: 8-22 mins        Aida Raider, PT  Office # [phone] Pager [phone]   Ilda Foil 11/15/2020, 12:59 PM

## 2020-11-15 NOTE — Progress Notes (Signed)
  Date: 11/15/2020  Patient name: Kathleen [name]  Medical record number: [id number]  Date of birth: [birth year]   I have seen and evaluated Kathleen [name] and discussed their care with the Residency Team.  In brief, patient is a 75 year old female with a past history of chronic hypoxic respiratory failure on 3 L O2 at home, persistent A. fib, chronic combined systolic and diastolic heart failure, asthma/COPD, OSA, OHS came to the ED with worsening cough and shortness of breath over the last couple of weeks.  Patient is feeling well until approximately 2 weeks ago when she began to have a productive cough with yellow phlegm associated with worsening shortness of breath and dyspnea at rest.  Over the course of the last couple weeks patient has had increase her oxygen gradually up to 5 L from her baseline of 3.  Patient has also been using her nebulizer at home without relief.  Patient was initially seen in urgent care and was treated with azithromycin and prednisone for COPD exacerbation without significant improvement.  Patient also was prescribed a 7-day course of cefuroxime for possible pneumonia.  Patient was seen by her pulmonologist at [facility] and was told to go to the ED for admission.  However, patient waited for 9 hours in the ED and was unable to be seen and came to [facility] emergency room for further evaluation.  Today, patient states that her shortness of breath has improved but she still has a persistent cough.  PMHx, Fam Hx, and/or Soc Hx : As per resident admit note  Vitals:   11/14/20 2311 11/15/20 0500  BP:  110/62  Pulse: 77 80  Resp: 20 20  Temp:  98.2 F (36.8 C)  SpO2: 94%    General: Awake, alert, oriented x3, NAD CVS: Regular rate and rhythm, normal heart sounds Lungs: Bilateral wheezes noted diffusely which resolved with coughing, coarse breath sounds heard bilaterally Abdomen: Soft, nontender, nondistended, normoactive bowel sounds Extremities: Trace bilateral lower  extremity pitting edema noted, nontender to palpation Psych: Normal mood and affect HEENT: Normocephalic, atraumatic Skin: Warm and dry  Assessment and Plan: I have seen and evaluated the patient as outlined above. I agree with the formulated Assessment and Plan as detailed in the residents' note, with the following changes:   1.  Acute on chronic hypoxic respiratory failure secondary to community-acquired pneumonia: -Patient presented ED with worsening shortness of breath and dyspnea exertion over the last 2 weeks and failed outpatient treatment with cefuroxime and azithromycin.  Chest x-ray showed left lower lobe opacity consistent with pneumonia. -Patient's leukocytosis has resolved today and her shortness of breath has improved -Given patient failed outpatient treatment with cephalosporins and azithromycin we will consider transitioning the patient to Unasyn while here.  When patient is ready for DC will transition to Augmentin -Continue with Robitussin and Tessalon for cough -Continue with home prednisone, Spiriva and Advair for her underlying COPD -No further work-up at this time -Patient should be stable for DC to SNF in 1 to 2 days  Earl Lagos, MD 6/10/20221:48 PM

## 2020-11-15 NOTE — Progress Notes (Signed)
HD#1 Subjective:  Overnight Events: None  [name] is sitting up at the edge of the bed breathing comfortably on 2 L nasal cannula.  She states that she has been dealing with this symptoms of worsening shortness of breath for the past few weeks.  She has seen multiple providers and is quite frustrated that there is no solution.  She feels fairly tired this morning but overall her breathing has improved significantly.  She states her COPD is well controlled.  She denies chills but states she did feel warm.  Over the past few weeks she has felt more short of breath with ambulating.  Her oxygen is only supposed to used with exertion and she has needed it at rest.   Has been having some urinary retention. Went to [facility] and had a catheter placed. Took the catheter out at [facility]. Does have bladder spasms. Since yesterday night, she has been able to urinate without difficulty.  Objective:  Vital signs in last 24 hours: Vitals:   11/14/20 2032 11/14/20 2311 11/15/20 0500 11/15/20 0609  BP:   110/62   Pulse: 84 77 80   Resp: 18 20 20    Temp:   98.2 F (36.8 C)   TempSrc:      SpO2: 98% 94%    Weight:    101.4 kg  Height:       Supplemental O2: Nasal Cannula SpO2: 94 % O2 Flow Rate (L/min): 4 L/min   Physical Exam:  Constitutional: In no acute distress HENT: normocephalic atraumatic Eyes: conjunctiva non-erythematous Neck: supple Cardiovascular: regular rate and  rhythm no m/r/g Pulmonary/Chest: normal work of breathing on 2 L nasal cannula, wheezing improved with cough.  Rhonchi bilateral lung bases. Abdominal: soft, non-tender, non-distended MSK: normal bulk and tone Neurological: alert & oriented x 3, 5/5 strength in bilateral upper and lower extremities, normal gait Skin: warm and dry Psych: Normal mood and thought process  Filed Weights   11/14/20 0956 11/15/20 0609  Weight: 101.6 kg 101.4 kg     Intake/Output Summary (Last 24 hours) at 11/15/2020 1442 Last data  filed at 11/15/2020 01/15/2021 Gross per 24 hour  Intake 480 ml  Output 1325 ml  Net -845 ml   Net IO Since Admission: -345 mL [11/15/20 1442]  Pertinent Labs: CBC Latest Ref Rng & Units 11/15/2020 11/14/2020 11/14/2020  WBC 4.0 - 10.5 K/uL 9.8 - -  Hemoglobin 12.0 - 15.0 g/dL 01/14/2021 03.5 59.7  Hematocrit 36.0 - 46.0 % 40.8 39.0 38.0  Platelets 150 - 400 K/uL 207 - -    CMP Latest Ref Rng & Units 11/15/2020 11/14/2020 11/14/2020  Glucose 70 - 99 mg/dL 01/14/2021) - 384(T)  BUN 8 - 23 mg/dL 15 - 15  Creatinine 364(W - 1.00 mg/dL 8.03 - 2.12  Sodium 2.48 - 145 mmol/L 135 139 138  Potassium 3.5 - 5.1 mmol/L 4.0 4.2 4.2  Chloride 98 - 111 mmol/L 100 - 101  CO2 22 - 32 mmol/L 25 - -  Calcium 8.9 - 10.3 mg/dL 9.1 - -  Total Protein 6.5 - 8.1 g/dL - - -  Total Bilirubin 0.3 - 1.2 mg/dL - - -  Alkaline Phos 38 - 126 U/L - - -  AST 15 - 41 U/L - - -  ALT 0 - 44 U/L - - -    Imaging: No results found.  Assessment/Plan:   Active Problems:   Community acquired pneumonia   Patient Summary: Kathleen [name] is a 75 y.o.  with a pertinent PMHx of chronic hypoxic resp failure on 3L at home, persistent A.fib, combined systolic/diastolic CHF, asthma/COPD overlap, OSA, OHS presenting to [facility] w/ cough, dyspnea for bacterial/viral pneumonia versus COPD exacerbation  Acute on chronic hypoxic respiratory failure COPD/Asthma overlap Hx of tracheobronchomalacia Patient with persistent cough and shortness of breath after failing azithromycin and cefuroxime on outpatient treatment over the past 2 weeks.  Uncertain if no improvement due to antibiotics or because not due to bacterial etiology.  Overall feeling better today, will transition to Augmentin.  WBC improved.  Patient without fevers.  Will need close pulmonary follow-up once discharged. - Transition to Augmentin tomorrow for total course of 5 days - Viral panel pending - Continuous pulse ox - Robitussin, tessalon for cough - C/w home prednisone dose, spiriva,  advair - Trend cbc   OHS/OSA Combined systolic diastolic heart failure We will continue BiPAP while admitted.  Last echo with EF of 55%.  Does not appear hypervolemic on exam.  - Bipap qhs - Monitor I/Os, Daily weights - C/w home meds: spironolactone 25mg  daily, Entresto 24-26mg  BID   Persistent A.fib HR 71. CHADSVASC2 score of 4.  Continue home medications.  Patient in regular rate and rhythm during my exam today. -Continuous telemetry - C/w home meds: Xarelto 20mg , dofetilide - Need to ensure dofetilide administration tonight or will have to do re-initiation for dofetilide  Diet: Heart Healthy IVF: None,None VTE:  Xarelto Code: Full PT/OT recs: SNF for Subacute PT, none.  Dispo: Anticipated discharge to Skilled nursing facility pending placement  DO Internal Medicine Resident PGY-1 Pager [phone] Please contact the on call pager after 5 pm and on weekends at [phone].

## 2020-11-15 NOTE — Plan of Care (Signed)
  Problem: Safety: Goal: Ability to remain free from injury will improve Outcome: Progressing   

## 2020-11-15 NOTE — Progress Notes (Signed)
I spoke with Ms Chaudhari regarding her symptoms. She states that she was seen by a urologist in the past for urine retention. She states that during that time she experienced lower abdominal pain an distention. She feels like she may be experiencing similar symptoms during this admission. Bladder scans ordered overnight, but she had good urine output documented. We will need to keep a close eye on her urine output to avoid over distention injury in the future.    Chari Manning, D.O.  Internal Medicine Resident, PGY-2 [facility]  Pager: [phone] 8:21 AM, 11/15/2020

## 2020-11-15 NOTE — Evaluation (Signed)
Occupational Therapy Evaluation Patient Details Name: Kathleen [name] MRN: [id number] DOB: Oct 18, 1945 Today's Date: 11/15/2020    History of Present Illness 75 yo F w/ PMH of chronic hypoxic resp failure on 3L at home, persistent A.fib, combined systolic/diastolic CHF, asthma/COPD overlap, OSA, OHS presenting to [facility] with complaint of cough and dyspnea. Admitted 6/9 with dx of community acquired pneumonia.   Clinical Impression   PTA patient was living in an ILF apartment (since 09/2020) and was grossly I with ADLs/IADLs including driving without AD. Patient reports use of rollator for community mobility. Patient currently functioning below baseline demonstrating observed ADLs including toileting/hygiene/clothing management and toilet transfers with Min guard to Min A. Patient also limited by deficits listed below including decreased cardiopulmonary endurance, generalized weakness and decreased activity tolerance requiring frequent and extended rest breaks and would benefit from continued acute OT services in prep for safe d/c to next level of care with recommendation for SNF.     Follow Up Recommendations  SNF    Equipment Recommendations  Other (comment) (Defer to next level of care)    Recommendations for Other Services       Precautions / Restrictions Precautions Precautions: Fall;Other (comment) Precaution Comments: watch sats Restrictions Weight Bearing Restrictions: No      Mobility Bed Mobility Overal bed mobility: Needs Assistance Bed Mobility: Sit to Supine     Supine to sit: Supervision;HOB elevated Sit to supine: Supervision;HOB elevated   General bed mobility comments: Supervision A for safety.    Transfers Overall transfer level: Needs assistance Equipment used: 1 person hand held assist Transfers: Sit to/from Stand Sit to Stand: Min guard Stand pivot transfers: Min guard       General transfer comment: Requires increased time/effort and HHA. Quick to  fatigue.    Balance Overall balance assessment: Needs assistance Sitting-balance support: No upper extremity supported;Feet supported Sitting balance-Leahy Scale: Good     Standing balance support: Single extremity supported;No upper extremity supported;During functional activity Standing balance-Leahy Scale: Fair Standing balance comment: static stand without UE support. HHA with functional mobility.                           ADL either performed or assessed with clinical judgement   ADL Overall ADL's : Needs assistance/impaired                         Toilet Transfer: Min Pension scheme manager Details (indicate cue type and reason): Min guard with HHA. Patient attempting to "furniture walk" holding onto counters/doors. Toileting- Clothing Manipulation and Hygiene: Minimal assistance;Sit to/from stand Toileting - Clothing Manipulation Details (indicate cue type and reason): Min A for hygiene for thoroughness.     Functional mobility during ADLs: Min guard General ADL Comments: Patient greatly limited by decreased activity tolerance, decreased cardiopulmonary endurance, and generalized weakness.     Vision         Perception     Praxis      Pertinent Vitals/Pain Pain Assessment: No/denies pain     Hand Dominance Right   Extremity/Trunk Assessment Upper Extremity Assessment Upper Extremity Assessment: Generalized weakness   Lower Extremity Assessment Lower Extremity Assessment: Defer to PT evaluation   Cervical / Trunk Assessment Cervical / Trunk Assessment: Normal   Communication Communication Communication: No difficulties   Cognition Arousal/Alertness: Awake/alert Behavior During Therapy: WFL for tasks assessed/performed Overall Cognitive Status: Within Functional Limits for tasks assessed  General Comments  SpO2 93% throughout on 2L O2 via [place].    Exercises     Shoulder  Instructions      Home Living Family/patient expects to be discharged to:: Other (Comment) (ILF) Living Arrangements: Alone Available Help at Discharge: Family;Available PRN/intermittently (Pt's son lives in [place].) Type of Home: Independent living facility Home Access: Elevator     Home Layout: One level     Bathroom Shower/Tub: Walk-in Soil scientist Toilet: Handicapped height     Home Equipment: Environmental consultant - 4 wheels;Cane - single point;Shower seat          Prior Functioning/Environment Level of Independence: Independent;Independent with assistive device(s)        Comments: rollator in community, no AD in home, drives. I with ADLs.        OT Problem List: Decreased strength;Decreased activity tolerance;Impaired balance (sitting and/or standing);Obesity      OT Treatment/Interventions: Self-care/ADL training;Therapeutic exercise;Energy conservation;Therapeutic activities;Patient/family education;Balance training    OT Goals(Current goals can be found in the care plan section) Acute Rehab OT Goals Patient Stated Goal: feel better OT Goal Formulation: With patient Time For Goal Achievement: 11/29/20 Potential to Achieve Goals: Good ADL Goals Pt Will Perform Grooming: with modified independence;standing Pt Will Perform Upper Body Dressing: with modified independence;sitting Pt Will Perform Lower Body Dressing: with modified independence;sit to/from stand;with adaptive equipment Pt Will Transfer to Toilet: with modified independence;ambulating;bedside commode Pt/caregiver will Perform Home Exercise Program: Increased strength;Both right and left upper extremity Additional ADL Goal #1: Patient will tolerate 15 minutes of ADL activity with 1 seated rest break indicating increased activity tolerance.  OT Frequency: Min 2X/week   Barriers to D/C: Decreased caregiver support  Lives alone       Co-evaluation              AM-PAC OT "6 Clicks" Daily Activity      Outcome Measure Help from another person eating meals?: None Help from another person taking care of personal grooming?: A Little Help from another person toileting, which includes using toliet, bedpan, or urinal?: A Little Help from another person bathing (including washing, rinsing, drying)?: A Little Help from another person to put on and taking off regular upper body clothing?: A Little Help from another person to put on and taking off regular lower body clothing?: A Little 6 Click Score: 19   End of Session Equipment Utilized During Treatment: Gait belt;Oxygen Nurse Communication: Mobility status  Activity Tolerance: Patient tolerated treatment well Patient left: in bed;with call bell/phone within reach;with bed alarm set  OT Visit Diagnosis: Unsteadiness on feet (R26.81);Muscle weakness (generalized) (M62.81)                Time: 4562-5638 OT Time Calculation (min): 21 min Charges:  OT General Charges $OT Visit: 1 Visit OT Evaluation $OT Eval Moderate Complexity: 1 Mod  Jinny Sweetland H. OTR/L Supplemental OT, Department of rehab services [phone]  Lisa Milian R H. 11/15/2020, 2:35 PM

## 2020-11-15 NOTE — Plan of Care (Signed)

## 2020-11-16 DIAGNOSIS — J449 Chronic obstructive pulmonary disease, unspecified: Secondary | ICD-10-CM

## 2020-11-16 DIAGNOSIS — G4733 Obstructive sleep apnea (adult) (pediatric): Secondary | ICD-10-CM

## 2020-11-16 DIAGNOSIS — I4819 Other persistent atrial fibrillation: Secondary | ICD-10-CM

## 2020-11-16 LAB — COMPREHENSIVE METABOLIC PANEL
ALT: 12 U/L (ref 0–44)
AST: 16 U/L (ref 15–41)
Albumin: 2.8 g/dL — ABNORMAL LOW (ref 3.5–5.0)
Alkaline Phosphatase: 34 U/L — ABNORMAL LOW (ref 38–126)
Anion gap: 9 (ref 5–15)
BUN: 12 mg/dL (ref 8–23)
CO2: 25 mmol/L (ref 22–32)
Calcium: 8.9 mg/dL (ref 8.9–10.3)
Chloride: 105 mmol/L (ref 98–111)
Creatinine, Ser: 0.73 mg/dL (ref 0.44–1.00)
GFR, Estimated: >60 mL/min (ref >60)
Glucose, Bld: 144 mg/dL — ABNORMAL HIGH (ref 70–99)
Potassium: 3.8 mmol/L (ref 3.5–5.1)
Sodium: 139 mmol/L (ref 135–145)
Total Bilirubin: 0.7 mg/dL (ref 0.3–1.2)
Total Protein: 4.9 g/dL — ABNORMAL LOW (ref 6.5–8.1)

## 2020-11-16 LAB — CBC WITH DIFFERENTIAL/PLATELET
Abs Immature Granulocytes: 0.11 10*3/uL — ABNORMAL HIGH (ref 0.00–0.07)
Basophils Absolute: 0.1 10*3/uL (ref 0.0–0.1)
Basophils Relative: 1 %
Eosinophils Absolute: 0.1 10*3/uL (ref 0.0–0.5)
Eosinophils Relative: 2 %
HCT: 37.7 % (ref 36.0–46.0)
Hemoglobin: 12.2 g/dL (ref 12.0–15.0)
Immature Granulocytes: 1 %
Lymphocytes Relative: 16 %
Lymphs Abs: 1.2 10*3/uL (ref 0.7–4.0)
MCH: 28.5 pg (ref 26.0–34.0)
MCHC: 32.4 g/dL (ref 30.0–36.0)
MCV: 88.1 fL (ref 80.0–100.0)
Monocytes Absolute: 0.5 10*3/uL (ref 0.1–1.0)
Monocytes Relative: 7 %
Neutro Abs: 5.6 10*3/uL (ref 1.7–7.7)
Neutrophils Relative %: 73 %
Platelets: 169 10*3/uL (ref 150–400)
RBC: 4.28 MIL/uL (ref 3.87–5.11)
RDW: 14.7 % (ref 11.5–15.5)
WBC: 7.6 10*3/uL (ref 4.0–10.5)
nRBC: 0 % (ref 0.0–0.2)

## 2020-11-16 LAB — LEGIONELLA PNEUMOPHILA SEROGP 1 UR AG: L. pneumophila Serogp 1 Ur Ag: NEGATIVE

## 2020-11-16 LAB — MAGNESIUM: Magnesium: 2.1 mg/dL (ref 1.7–2.4)

## 2020-11-16 MED ORDER — POTASSIUM CHLORIDE CRYS ER 20 MEQ PO TBCR
40.0000 meq | EXTENDED_RELEASE_TABLET | Freq: Once | ORAL | Status: AC
  Administered 2020-11-16: 40 meq via ORAL
  Filled 2020-11-16: qty 2

## 2020-11-16 MED ORDER — SODIUM CHLORIDE 0.9 % IV SOLN
500.0000 mg | INTRAVENOUS | Status: DC
  Administered 2020-11-16: 500 mg via INTRAVENOUS
  Filled 2020-11-16 (×2): qty 500

## 2020-11-16 MED ORDER — SALINE SPRAY 0.65 % NA SOLN
1.0000 | NASAL | Status: DC | PRN
Start: 2020-11-16 — End: 2020-11-19
  Administered 2020-11-16: 1 via NASAL
  Filled 2020-11-16: qty 44

## 2020-11-16 MED ORDER — BENZONATATE 100 MG PO CAPS
200.0000 mg | ORAL_CAPSULE | Freq: Three times a day (TID) | ORAL | Status: DC
  Administered 2020-11-16 – 2020-11-18 (×8): 200 mg via ORAL
  Filled 2020-11-16 (×8): qty 2

## 2020-11-16 MED ORDER — IPRATROPIUM-ALBUTEROL 0.5-2.5 (3) MG/3ML IN SOLN
3.0000 mL | Freq: Four times a day (QID) | RESPIRATORY_TRACT | Status: DC
  Administered 2020-11-16 – 2020-11-18 (×8): 3 mL via RESPIRATORY_TRACT
  Filled 2020-11-16 (×7): qty 3

## 2020-11-16 MED ORDER — SODIUM CHLORIDE 0.9 % IV SOLN
3.0000 g | Freq: Four times a day (QID) | INTRAVENOUS | Status: DC
  Filled 2020-11-16 (×3): qty 8

## 2020-11-16 MED ORDER — SODIUM CHLORIDE 3 % IN NEBU
4.0000 mL | INHALATION_SOLUTION | Freq: Every day | RESPIRATORY_TRACT | Status: DC
  Administered 2020-11-17 – 2020-11-18 (×2): 4 mL via RESPIRATORY_TRACT
  Filled 2020-11-16 (×3): qty 4

## 2020-11-16 MED ORDER — SODIUM CHLORIDE 0.9 % IV SOLN
2.0000 g | Freq: Three times a day (TID) | INTRAVENOUS | Status: DC
  Administered 2020-11-16 – 2020-11-18 (×8): 2 g via INTRAVENOUS
  Filled 2020-11-16 (×10): qty 2

## 2020-11-16 NOTE — [place] FL2 (Addendum)
[place] MEDICAID FL2 LEVEL OF CARE SCREENING TOOL     IDENTIFICATION  Patient Name: Kathleen [name] Birthdate: 23-Feb-1946 Sex: female Admission Date (Current Location): 11/14/2020  [facility] and [place] Number:  Producer, television/film/video and Address:  [facility], [address], [place], [place] [address]      Provider Number: [id number]  Attending Physician Name and Address:  Earl Lagos, MD  Relative Name and Phone Number:  [name], son, [phone]    Current Level of Care: Hospital Recommended Level of Care: Skilled Nursing Facility Prior Approval Number:    Date Approved/Denied:   PASRR Number: [id number]  Discharge Plan: SNF    Current Diagnoses: Patient Active Problem List   Diagnosis Date Noted   Community acquired pneumonia 11/14/2020   Generalized abdominal pain    Constipation    Hypercalcemia 06/26/2019   (HFpEF) heart failure with preserved ejection fraction (HCC) 06/26/2019   Acute kidney injury superimposed on CKD (HCC) 06/26/2019   Chronic systolic heart failure (HCC) 04/06/2018   HTN (hypertension) 04/06/2018   COPD (chronic obstructive pulmonary disease) (HCC) 04/06/2018   Atrial fibrillation (HCC) 04/06/2018   Acute bronchitis 09/17/2015   Malnutrition of moderate degree 09/11/2015   Fever 09/08/2015   Hyponatremia 09/08/2015    Orientation RESPIRATION BLADDER Height & Weight     Self, Time, Situation, Place  O2 (3L Nasal cannula); has home Bipap machine at night Continent Weight: 223 lb 6.4 oz (101.3 kg) Height:  5' 1.5" (156.2 cm)  BEHAVIORAL SYMPTOMS/MOOD NEUROLOGICAL BOWEL NUTRITION STATUS      Continent Diet (Please see DC Summary)  AMBULATORY STATUS COMMUNICATION OF NEEDS Skin   Limited Assist Verbally Normal                       Personal Care Assistance Level of Assistance  Bathing, Feeding, Dressing Bathing Assistance: Limited assistance Feeding assistance: Independent Dressing Assistance:  Limited assistance     Functional Limitations Info             SPECIAL CARE FACTORS FREQUENCY  PT (By licensed PT), OT (By licensed OT)     PT Frequency: 5x/week OT Frequency: 5x/week            Contractures Contractures Info: Not present    Additional Factors Info  Code Status, Allergies Code Status Info: Full Allergies Info: Allopurinol, Clindamycin, Pneumococcal 13-val Conj Vacc, Dronedarone, Entresto (Sacubitril-valsartan), Fosamax (Alendronate Sodium), Meperidine, Penicillins, Rosuvastatin, Tetracycline, Adhesive (Tape), Lovastatin           Current Medications (11/16/2020):  This is the current hospital active medication list Current Facility-Administered Medications  Medication Dose Route Frequency Provider Last Rate Last Admin   albuterol (VENTOLIN HFA) 108 (90 Base) MCG/ACT inhaler 2 puff  2 puff Inhalation Q4H PRN Theotis Barrio, MD       Ampicillin-Sulbactam (UNASYN) 3 g in sodium chloride 0.9 % 100 mL IVPB  3 g Intravenous Q6H Desiree Hane, RPH       benzonatate (TESSALON) capsule 100 mg  100 mg Oral TID PRN Theotis Barrio, MD   100 mg at 11/16/20 0727   [START ON 12/05/2020] cyanocobalamin ((VITAMIN B-12)) injection 1,000 mcg  1,000 mcg Intramuscular Q30 days Theotis Barrio, MD       dofetilide [facility]) capsule 250 mcg  250 mcg Oral BID Theotis Barrio, MD   250 mcg at 11/16/20 1048   ezetimibe (ZETIA) tablet 10 mg  10 mg Oral QHS  Theotis Barrio, MD   10 mg at 11/15/20 2201   fluticasone furoate-vilanterol (BREO ELLIPTA) 200-25 MCG/INH 1 puff  1 puff Inhalation Daily Theotis Barrio, MD   1 puff at 11/16/20 1104   guaiFENesin-dextromethorphan (ROBITUSSIN DM) 100-10 MG/5ML syrup 5 mL  5 mL Oral Q4H PRN Theotis Barrio, MD       ipratropium-albuterol (DUONEB) 0.5-2.5 (3) MG/3ML nebulizer solution 3 mL  3 mL Nebulization Q6H Verdene Lennert, MD   3 mL at 11/16/20 1050   levothyroxine (SYNTHROID) tablet 50 mcg  50 mcg Oral QAC breakfast Theotis Barrio, MD   50 mcg at  11/16/20 0506   melatonin tablet 3 mg  3 mg Oral QHS Glenford Bayley, MD   3 mg at 11/15/20 2201   pantoprazole (PROTONIX) EC tablet 40 mg  40 mg Oral Daily Theotis Barrio, MD   40 mg at 11/16/20 1047   phenol (CHLORASEPTIC) mouth spray 1 spray  1 spray Mouth/Throat PRN Glenford Bayley, MD   1 spray at 11/15/20 2016   polyethylene glycol (MIRALAX / GLYCOLAX) packet 17 g  17 g Oral Daily PRN Theotis Barrio, MD       predniSONE (DELTASONE) tablet 7.5 mg  7.5 mg Oral Q breakfast Theotis Barrio, MD   7.5 mg at 11/16/20 1048   rivaroxaban (XARELTO) tablet 20 mg  20 mg Oral Q supper Theotis Barrio, MD   20 mg at 11/15/20 1633   sacubitril-valsartan (ENTRESTO) 24-26 mg per tablet  1 tablet Oral BID Theotis Barrio, MD   1 tablet at 11/16/20 1104   sodium chloride (OCEAN) 0.65 % nasal spray 1 spray  1 spray Each Nare PRN John Giovanni, MD   1 spray at 11/16/20 0607   sodium chloride HYPERTONIC 3 % nebulizer solution 4 mL  4 mL Nebulization Daily Verdene Lennert, MD       spironolactone (ALDACTONE) tablet 25 mg  25 mg Oral Daily Theotis Barrio, MD   25 mg at 11/16/20 1047   umeclidinium bromide (INCRUSE ELLIPTA) 62.5 MCG/INH 1 puff  1 puff Inhalation Daily Earl Lagos, MD   1 puff at 11/16/20 1103     Discharge Medications: Please see discharge summary for a list of discharge medications.  Relevant Imaging Results:  Relevant Lab Results:   Additional Information SSn: [id number]. Pfizer COVID-19 Vaccine 10/09/2020 , 08/08/2019 , 07/13/2019. On Tikosyn  Carley Strickling S Isiah Scheel, LCSW

## 2020-11-16 NOTE — Progress Notes (Signed)
Patient stated she didn't want to wear BIPAP tonight. She doesn't like our masks. RT instructed patient to call if she changes her mind.

## 2020-11-16 NOTE — Progress Notes (Signed)
Ampicillin was given pt stated she was allergic to penicillin MD paged to be notify MD [place].

## 2020-11-16 NOTE — Progress Notes (Addendum)
Pharmacy Antibiotic Note  Kathleen [name] is a 75 y.o. female admitted on 11/14/2020 with pneumonia.  Pharmacy has been consulted for Cefepime dosing.  Patient initially presented to ED with increased work of breathing and leukocytosis. Diagnosed with recurrent CAP. In ED, received one dose of levofloxacin IV, pharmacy consulted to assess penicillin allergy and switch to CTX + azithro. Patient meets criteria of non-severe penicillin allergy (see allergy tab), so switched antibiotics.  Patient now with increased coughing, continued SOB and dizziness. Initially planned to broaden coverage to Unasyn but given patient's discomfort with history of hives, pharmacy was asked to adjust regimen to cefepime and azithromycin.   Plan: Azithromycin 500 mg IV q24h  Cefepime 2g IV q8h   Height: 5' 1.5" (156.2 cm) Weight: 101.3 kg (223 lb 6.4 oz) IBW/kg (Calculated) : 48.95  Temp (24hrs), Avg:98 F (36.7 C), Min:97.8 F (36.6 C), Max:98.2 F (36.8 C)  Recent Labs  Lab 11/14/20 1055 11/14/20 1116 11/15/20 0140 11/16/20 0338  WBC 14.9*  --  9.8 7.6  CREATININE  --  0.80 0.92 0.73     Estimated Creatinine Clearance: 69.1 mL/min (by C-G formula based on SCr of 0.73 mg/dL).    Allergies  Allergen Reactions   Allopurinol Other (See Comments)    Reaction:  Dizziness    Clindamycin Anaphylaxis and Hives   Pneumococcal 13-Val Conj Vacc Itching, Swelling and Rash   Dronedarone Rash   Entresto [Sacubitril-Valsartan] Other (See Comments)    hypotension   Fosamax [Alendronate Sodium] Nausea Only   Meperidine Nausea And Vomiting   Penicillins Hives and Other (See Comments)    Has patient had a PCN reaction causing immediate rash, facial/tongue/throat swelling, SOB or lightheadedness with hypotension: No Has patient had a PCN reaction causing severe rash involving mucus membranes or skin necrosis: No Has patient had a PCN reaction that required hospitalization No Has patient had a PCN reaction  occurring within the last 10 years: No If all of the above answers are "NO", then may proceed with Cephalosporin use.   Rosuvastatin Other (See Comments)    Reaction:  Muscle spasms    Tetracycline Hives   Adhesive [Tape] Rash   Lovastatin Rash    Other reaction(s): Muscle Pain    Antimicrobials this admission: 6/9 levofloxacin x1 CTX 6/10 >> 6/11 Azithro 6/10 >>  Cefepime 6/11>>  Dose adjustments this admission: NA  Microbiology results: N/A  Thank you for allowing pharmacy to be a part of this patient's care.  Trixie Rude, PharmD PGY1 Acute Care Pharmacy Resident 11/16/2020 8:27 AM  Please check [email or web address] for unit-specific pharmacy phone numbers.

## 2020-11-16 NOTE — TOC Initial Note (Signed)
Transition of Care [facility]) - Initial/Assessment Note    Patient Details  Name: Kathleen [name] MRN: [id number] Date of Birth: 1946/03/18  Transition of Care [facility]) CM/SW Contact:    Mearl Latin, LCSW Phone Number: 11/16/2020, 11:52 AM  Clinical Narrative:                 CSW received consult for possible SNF placement at time of discharge. CSW spoke with patient. She resides at [facility] and they are not able to provide much rehab. Patient expressed understanding of PT recommendation and is agreeable to SNF placement at time of discharge. Patient reports preference for [facility] as it has been recommended to her. CSW discussed insurance authorization process and provided Medicare SNF ratings list. Patient has received three COVID vaccines. Patient expressed being hopeful for rehab and to feel better soon. No further questions reported at this time.    Expected Discharge Plan: Skilled Nursing Facility Barriers to Discharge: Continued Medical Work up   Patient Goals and CMS Choice Patient states their goals for this hospitalization and ongoing recovery are:: Rehab CMS Medicare.gov Compare Post Acute Care list provided to:: Patient Choice offered to / list presented to : Patient  Expected Discharge Plan and Services Expected Discharge Plan: Skilled Nursing Facility In-house Referral: Clinical Social Work   Post Acute Care Choice: Skilled Nursing Facility Living arrangements for the past 2 months: Independent Living Facility                                      Prior Living Arrangements/Services Living arrangements for the past 2 months: Independent Living Facility Lives with:: Self Patient language and need for interpreter reviewed:: Yes Do you feel safe going back to the place where you live?: Yes      Need for Family Participation in Patient Care: No (Comment) Care giver support system in place?: Yes (comment)   Criminal Activity/Legal Involvement  Pertinent to Current Situation/Hospitalization: No - Comment as needed  Activities of Daily Living Home Assistive Devices/Equipment: Cane (specify quad or straight), Shower chair with back, Environmental consultant (specify type) ADL Screening (condition at time of admission) Patient's cognitive ability adequate to safely complete daily activities?: Yes Is the patient deaf or have difficulty hearing?: No Does the patient have difficulty seeing, even when wearing glasses/contacts?: No Does the patient have difficulty concentrating, remembering, or making decisions?: No Patient able to express need for assistance with ADLs?: Yes Does the patient have difficulty dressing or bathing?: No Independently performs ADLs?: Yes (appropriate for developmental age) Does the patient have difficulty walking or climbing stairs?: Yes (pt gets short of breath) Weakness of Legs: None Weakness of Arms/Hands: None  Permission Sought/Granted Permission sought to share information with : Facility Medical sales representative, Family Supports Permission granted to share information with : Yes, Verbal Permission Granted     Permission granted to share info w AGENCY: SNFs        Emotional Assessment Appearance:: Appears stated age Attitude/Demeanor/Rapport: Engaged, Gracious Affect (typically observed): Accepting, Appropriate, Pleasant Orientation: : Oriented to Self, Oriented to Place, Oriented to  Time, Oriented to Situation Alcohol / Substance Use: Not Applicable Psych Involvement: No (comment)  Admission diagnosis:  Community acquired pneumonia [J18.9] Community acquired pneumonia of left lower lobe of lung [J18.9] Patient Active Problem List   Diagnosis Date Noted   Community acquired pneumonia 11/14/2020   Generalized abdominal pain    Constipation  Hypercalcemia 06/26/2019   (HFpEF) heart failure with preserved ejection fraction (HCC) 06/26/2019   Acute kidney injury superimposed on CKD (HCC) 06/26/2019   Chronic  systolic heart failure (HCC) 04/06/2018   HTN (hypertension) 04/06/2018   COPD (chronic obstructive pulmonary disease) (HCC) 04/06/2018   Atrial fibrillation (HCC) 04/06/2018   Acute bronchitis 09/17/2015   Malnutrition of moderate degree 09/11/2015   Fever 09/08/2015   Hyponatremia 09/08/2015   PCP:  Marguarite Arbour, MD Pharmacy:   [facility] [phone] - [place],  - [address] AT [facility] [address] [facility] [address] [place] [address] Phone: [phone] Fax: [phone]     Social Determinants of Health (SDOH) Interventions    Readmission Risk Interventions Readmission Risk Prevention Plan 06/28/2019  Transportation Screening Complete  PCP or Specialist Appt within 3-5 Days Complete  HRI or Home Care Consult Complete  Palliative Care Screening Not Applicable  Medication Review (RN Care Manager) Complete  Some recent data might be hidden

## 2020-11-16 NOTE — Progress Notes (Signed)
HD#2 Subjective:   Overnight Events: None  Kathleen [name] states she has been doing alright except her cough has started back up and is more frequent now. She does feel short of breath, especially early this morning and had to ask for a breathing treatment. Cough is described as really bad and if she has a long coughing spell, feels like she is going to pass out.  Has some dizziness when she tries to stand up.  No burning when she urinates and no hematuria noted.  Objective:  Vital signs in last 24 hours: Vitals:   11/15/20 0609 11/15/20 1511 11/15/20 1936 11/16/20 0512  BP:  (!) 113/58 (!) 130/58 131/61  Pulse:  71 73 73  Resp:  20 19   Temp:  98.2 F (36.8 C) 98 F (36.7 C) 97.8 F (36.6 C)  TempSrc:      SpO2:  93% 94% 99%  Weight: 101.4 kg   101.3 kg  Height:       Supplemental O2: Nasal Cannula SpO2: 99 % O2 Flow Rate (L/min): 3 L/min  Physical Exam:  Constitutional: In no acute distress HENT: normocephalic atraumatic Eyes: conjunctiva non-erythematous Neck: supple Cardiovascular: regular rate and  rhythm no m/r/g Pulmonary/Chest: normal work of breathing on 3 L nasal cannula. Rhonchi bilaterally throughout with fine bibasilar crackles. No wheezing.  Abdominal: soft, non-tender, non-distended MSK: normal bulk and tone Neurological: alert & oriented x 3 Skin: warm and dry Psych: Normal mood and thought process  Filed Weights   11/14/20 0956 11/15/20 0609 11/16/20 0512  Weight: 101.6 kg 101.4 kg 101.3 kg    Intake/Output Summary (Last 24 hours) at 11/16/2020 0653 Last data filed at 11/16/2020 0525 Gross per 24 hour  Intake 960 ml  Output 1400 ml  Net -440 ml    Net IO Since Admission: -785 mL [11/16/20 0653]  Pertinent Labs: CBC Latest Ref Rng & Units 11/16/2020 11/15/2020 11/14/2020  WBC 4.0 - 10.5 K/uL 7.6 9.8 -  Hemoglobin 12.0 - 15.0 g/dL 48.1 85.6 31.4  Hematocrit 36.0 - 46.0 % 37.7 40.8 39.0  Platelets 150 - 400 K/uL 169 207 -   CMP Latest Ref Rng  & Units 11/16/2020 11/15/2020 11/14/2020  Glucose 70 - 99 mg/dL 970(Y) 637(C) -  BUN 8 - 23 mg/dL 12 15 -  Creatinine 5.88 - 1.00 mg/dL 5.02 7.74 -  Sodium 128 - 145 mmol/L 139 135 139  Potassium 3.5 - 5.1 mmol/L 3.8 4.0 4.2  Chloride 98 - 111 mmol/L 105 100 -  CO2 22 - 32 mmol/L 25 25 -  Calcium 8.9 - 10.3 mg/dL 8.9 9.1 -  Total Protein 6.5 - 8.1 g/dL 4.9(L) - -  Total Bilirubin 0.3 - 1.2 mg/dL 0.7 - -  Alkaline Phos 38 - 126 U/L 34(L) - -  AST 15 - 41 U/L 16 - -  ALT 0 - 44 U/L 12 - -   Imaging: No results found.  Assessment/Plan:   Active Problems:   Community acquired pneumonia   Patient Summary: Kathleen [name] is a 75 y.o. 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 Work up so far revealed no  viral etiologies. Given patient's cough is persisted and actually worsening today, will continue with broad spectrum antibiotics. Patient has a penicillin allergy and uncomfortable taking Unasyn/Augmentin. Discussed with pharmacy, who is recommending Azithromycin and Cefepime while admitted to complete a 5-7 day course.   - Continue supplemental oxygen to maintain oxygen saturation above 88%  - Continuous pulse ox - Start Cefepime per pharmacy dosing - Start Azithromycin 500 mg IV daily  - Schedule Tessalon pearls at increased dose, 200 mg TID  - Start  daily 3% saline nebulizer treatment - Schedule duoneb treatments q6h  - C/w home prednisone dose, spiriva, advair - Trend cbc   OHS/OSA Combined systolic diastolic heart failure We will continue BiPAP while  admitted.  Last echo with EF of 55%.  Does not appear hypervolemic on exam.  - Bipap qhs - Monitor I/Os, Daily weights - C/w home meds: spironolactone 25mg  daily, Entresto 24-26mg  BID   Persistent A.fib HR 71. CHADSVASC2 score of 4.  Continue home medications.  Patient in regular rate and rhythm during my exam today. - Continuous telemetry - C/w home meds: Xarelto 20mg , dofetilide  Diet: Heart Healthy IVF: None,None VTE:  Xarelto Code: Full PT/OT recs: SNF for Subacute PT, none.  Dispo: Anticipated discharge to Skilled nursing facility pending placement   Dr. Internal Medicine PGY-2  Pager: [phone] After 5pm on weekdays and 1pm on weekends: On Call pager [phone]  11/16/2020, 1:03 PM

## 2020-11-17 LAB — COMPREHENSIVE METABOLIC PANEL
ALT: 15 U/L (ref 0–44)
AST: 15 U/L (ref 15–41)
Albumin: 3.3 g/dL — ABNORMAL LOW (ref 3.5–5.0)
Alkaline Phosphatase: 39 U/L (ref 38–126)
Anion gap: 9 (ref 5–15)
BUN: 14 mg/dL (ref 8–23)
CO2: 24 mmol/L (ref 22–32)
Calcium: 9.4 mg/dL (ref 8.9–10.3)
Chloride: 105 mmol/L (ref 98–111)
Creatinine, Ser: 0.7 mg/dL (ref 0.44–1.00)
GFR, Estimated: >60 mL/min (ref >60)
Glucose, Bld: 119 mg/dL — ABNORMAL HIGH (ref 70–99)
Potassium: 4 mmol/L (ref 3.5–5.1)
Sodium: 138 mmol/L (ref 135–145)
Total Bilirubin: 0.9 mg/dL (ref 0.3–1.2)
Total Protein: 5.6 g/dL — ABNORMAL LOW (ref 6.5–8.1)

## 2020-11-17 LAB — CBC
HCT: 41 % (ref 36.0–46.0)
Hemoglobin: 13.1 g/dL (ref 12.0–15.0)
MCH: 28.4 pg (ref 26.0–34.0)
MCHC: 32 g/dL (ref 30.0–36.0)
MCV: 88.9 fL (ref 80.0–100.0)
Platelets: 156 10*3/uL (ref 150–400)
RBC: 4.61 MIL/uL (ref 3.87–5.11)
RDW: 14.7 % (ref 11.5–15.5)
WBC: 8.7 10*3/uL (ref 4.0–10.5)
nRBC: 0 % (ref 0.0–0.2)

## 2020-11-17 NOTE — Progress Notes (Signed)
HD#3 Subjective:  Overnight Events: Patient did not where CPAP overnight  Kathleen [name] sitting up on edge of bed, breathing comfortably on 4L [place].  Notes improvement of her shortness of breath over the past day.  States her cough is improved with cough medicines as well.  She has to sleep sitting up during hospitalization, at home she sleeps on 3 pillows to help with her shortness of breath.  She is eating and drinking well.  Urinating and passing her bowels without difficulty.  Discussed plan with patient to discharge to SNF facility tomorrow pending COVID test.  She did not wear her CPAP overnight as the mask did not fit comfortably and was too tight.  Discussed with her that she can bring her own machine to the hospital as well as to the SNF facility.  Her daughter-in-law is attempting to bring her her home machine.  Patient voiced concern of being discharged too soon, discussed with her that we will reassess tomorrow but overall her breathing is improved and she feels better.  She agrees with this plan  Objective:  Vital signs in last 24 hours: Vitals:   11/16/20 1925 11/16/20 2043 11/17/20 0241 11/17/20 0339  BP:  118/63  (!) 120/39  Pulse:  74  71  Resp:  18  20  Temp:  98.1 F (36.7 C)  97.8 F (36.6 C)  TempSrc:  Oral    SpO2: 96% 97% 96% 98%  Weight:      Height:       Supplemental O2: Nasal Cannula SpO2: 98 % O2 Flow Rate (L/min): 3 L/min  Physical Exam:  Constitutional: Well-appearing female, no acute distress.  Nasal cannula in place. HENT: normocephalic atraumatic Eyes: conjunctiva non-erythematous Neck: supple Cardiovascular: regular rate and rhythm, no m/r/g.  Pulmonary/Chest: normal work of breathing on 4 L, rhonchi cleared with cough.  Fine bibasilar crackles.  No wheezing Abdominal: soft, non-tender, non-distended MSK: normal bulk and tone Neurological: alert & oriented x 3 Skin: warm and dry Psych: Normal mood and thought process  Filed Weights   11/14/20  0956 11/15/20 0609 11/16/20 0512  Weight: 101.6 kg 101.4 kg 101.3 kg    No intake or output data in the 24 hours ending 11/17/20 0716 Net IO Since Admission: -785 mL [11/17/20 0716]  Pertinent Labs: CBC Latest Ref Rng & Units 11/16/2020 11/15/2020 11/14/2020  WBC 4.0 - 10.5 K/uL 7.6 9.8 -  Hemoglobin 12.0 - 15.0 g/dL 72.6 20.3 55.9  Hematocrit 36.0 - 46.0 % 37.7 40.8 39.0  Platelets 150 - 400 K/uL 169 207 -    CMP Latest Ref Rng & Units 11/16/2020 11/15/2020 11/14/2020  Glucose 70 - 99 mg/dL 741(U) 384(T) -  BUN 8 - 23 mg/dL 12 15 -  Creatinine 3.64 - 1.00 mg/dL 6.80 3.21 -  Sodium 224 - 145 mmol/L 139 135 139  Potassium 3.5 - 5.1 mmol/L 3.8 4.0 4.2  Chloride 98 - 111 mmol/L 105 100 -  CO2 22 - 32 mmol/L 25 25 -  Calcium 8.9 - 10.3 mg/dL 8.9 9.1 -  Total Protein 6.5 - 8.1 g/dL 4.9(L) - -  Total Bilirubin 0.3 - 1.2 mg/dL 0.7 - -  Alkaline Phos 38 - 126 U/L 34(L) - -  AST 15 - 41 U/L 16 - -  ALT 0 - 44 U/L 12 - -    Imaging: No results found.  Assessment/Plan:   Active Problems:   Community acquired pneumonia   Patient Summary: Kathleen [name] is  a 75 y.o. with a pertinent PMHx of chronic hypoxic resp failure on 3L at home, persistent A.fib, combined systolic/diastolic CHF, asthma/COPD overlap, OSA, OHS presenting to [facility] w/ cough, dyspnea for bacterial/viral pneumonia versus COPD exacerbation  Acute on chronic hypoxic respiratory failure COPD/Asthma overlap Hx of tracheobronchomalacia Patient feels better from yesterday in terms of shortness of breath and, plan to continue cefepime.  We will continue symptomatic treatment for cough and shortness of breath.  Oxygen supplementation appears to have improved overall, will continue to wean as tolerable. - Cefepime day 2/5 - Continuous pulse ox, wean as tolerable O2 sat goal over 88%. - Robitussin, tessalon for cough - C/w home prednisone dose, spiriva, advair - Follow-up with pulmonology on outpatient basis   OHS/OSA Combined  systolic diastolic heart failure We will continue BiPAP while admitted.  Attempting to bring home machine.  Last echo with EF of 55%.  Does not appear hypervolemic on exam. - Bipap qhs - Monitor I/Os, Daily weights - C/w home meds: spironolactone 25mg  daily, Entresto 24-26mg  BID   Persistent A.fib HR 71. CHADSVASC2 score of 4.  Continue home medications.  Patient in regular rate and rhythm during my exam today. - Continuous telemetry - C/w home meds: Xarelto 20mg , dofetilide - Need to ensure dofetilide administration tonight or will have to do re-initiation for dofetilide  Diet: Heart Healthy IVF: None,None VTE:  xarelto Code: Full PT/OT recs: SNF for Subacute PT,  Dispo: Anticipated discharge to Skilled nursing facility tomorrow pending further improvement  DO Internal Medicine Resident PGY-1 Pager [phone] Please contact the on call pager after 5 pm and on weekends at [phone].

## 2020-11-17 NOTE — Progress Notes (Signed)
Pt stated she did not want to wear bipap tonight. RT will continue to monitor as needed.

## 2020-11-17 NOTE — TOC Progression Note (Signed)
Transition of Care [facility]) - Progression Note    Patient Details  Name: Kathleen [name] MRN: [id number] Date of Birth: 02/21/1946  Transition of Care [facility]) CM/SW Contact  Carley Hammed, [place] Phone Number: 11/17/2020, 9:59 AM  Clinical Narrative:    CSW noted that [facility] had offered on this pt, and confirmed with pt this was her first choice. Pt agreed to [place], CSW spoke with facility and they can take pt tomorrow. MD notified and covid ordered. SW will continue to follow for DC needs.   Expected Discharge Plan: Skilled Nursing Facility Barriers to Discharge: Continued Medical Work up  Expected Discharge Plan and Services Expected Discharge Plan: Skilled Nursing Facility In-house Referral: Clinical Social Work   Post Acute Care Choice: Skilled Nursing Facility Living arrangements for the past 2 months: Independent Living Facility                                       Social Determinants of Health (SDOH) Interventions    Readmission Risk Interventions Readmission Risk Prevention Plan 06/28/2019  Transportation Screening Complete  PCP or Specialist Appt within 3-5 Days Complete  HRI or Home Care Consult Complete  Palliative Care Screening Not Applicable  Medication Review (RN Care Manager) Complete  Some recent data might be hidden

## 2020-11-18 LAB — BASIC METABOLIC PANEL
Anion gap: 8 (ref 5–15)
BUN: 18 mg/dL (ref 8–23)
CO2: 27 mmol/L (ref 22–32)
Calcium: 9.7 mg/dL (ref 8.9–10.3)
Chloride: 103 mmol/L (ref 98–111)
Creatinine, Ser: 0.76 mg/dL (ref 0.44–1.00)
GFR, Estimated: >60 mL/min (ref >60)
Glucose, Bld: 129 mg/dL — ABNORMAL HIGH (ref 70–99)
Potassium: 4 mmol/L (ref 3.5–5.1)
Sodium: 138 mmol/L (ref 135–145)

## 2020-11-18 LAB — CBC WITH DIFFERENTIAL/PLATELET
Abs Immature Granulocytes: 0.11 10*3/uL — ABNORMAL HIGH (ref 0.00–0.07)
Basophils Absolute: 0.1 10*3/uL (ref 0.0–0.1)
Basophils Relative: 1 %
Eosinophils Absolute: 0.2 10*3/uL (ref 0.0–0.5)
Eosinophils Relative: 2 %
HCT: 39.6 % (ref 36.0–46.0)
Hemoglobin: 12.6 g/dL (ref 12.0–15.0)
Immature Granulocytes: 1 %
Lymphocytes Relative: 13 %
Lymphs Abs: 1.4 10*3/uL (ref 0.7–4.0)
MCH: 27.9 pg (ref 26.0–34.0)
MCHC: 31.8 g/dL (ref 30.0–36.0)
MCV: 87.8 fL (ref 80.0–100.0)
Monocytes Absolute: 0.8 10*3/uL (ref 0.1–1.0)
Monocytes Relative: 8 %
Neutro Abs: 8.3 10*3/uL — ABNORMAL HIGH (ref 1.7–7.7)
Neutrophils Relative %: 75 %
Platelets: 173 10*3/uL (ref 150–400)
RBC: 4.51 MIL/uL (ref 3.87–5.11)
RDW: 14.7 % (ref 11.5–15.5)
WBC: 11 10*3/uL — ABNORMAL HIGH (ref 4.0–10.5)
nRBC: 0 % (ref 0.0–0.2)

## 2020-11-18 LAB — SARS CORONAVIRUS 2 (TAT 6-24 HRS): SARS Coronavirus 2: NEGATIVE

## 2020-11-18 MED ORDER — GUAIFENESIN-CODEINE 100-10 MG/5ML PO SOLN
10.0000 mL | Freq: Four times a day (QID) | ORAL | Status: DC | PRN
  Administered 2020-11-18 (×3): 10 mL via ORAL
  Filled 2020-11-18 (×3): qty 10

## 2020-11-18 MED ORDER — GUAIFENESIN-CODEINE 100-10 MG/5ML PO SOLN: 10.0000 mL | Freq: Four times a day (QID) | ORAL | 0 refills | Status: DC | PRN

## 2020-11-18 MED ORDER — ALBUTEROL SULFATE (2.5 MG/3ML) 0.083% IN NEBU
INHALATION_SOLUTION | RESPIRATORY_TRACT | Status: AC
  Administered 2020-11-18: 2.5 mg
  Filled 2020-11-18: qty 3

## 2020-11-18 MED ORDER — CEFDINIR 300 MG PO CAPS: 300.0000 mg | ORAL_CAPSULE | Freq: Two times a day (BID) | ORAL | 0 refills | Status: AC

## 2020-11-18 MED ORDER — ALBUTEROL SULFATE (2.5 MG/3ML) 0.083% IN NEBU
2.5000 mg | INHALATION_SOLUTION | Freq: Two times a day (BID) | RESPIRATORY_TRACT | Status: DC
  Administered 2020-11-18: 2.5 mg via RESPIRATORY_TRACT
  Filled 2020-11-18: qty 3

## 2020-11-18 MED ORDER — ALBUTEROL SULFATE (2.5 MG/3ML) 0.083% IN NEBU: 2.5000 mg | INHALATION_SOLUTION | RESPIRATORY_TRACT | Status: DC

## 2020-11-18 MED ORDER — ALBUTEROL SULFATE (2.5 MG/3ML) 0.083% IN NEBU
2.5000 mg | INHALATION_SOLUTION | Freq: Four times a day (QID) | RESPIRATORY_TRACT | Status: DC
  Administered 2020-11-18: 2.5 mg via RESPIRATORY_TRACT
  Filled 2020-11-18: qty 3

## 2020-11-18 MED ORDER — PHENOL 1.4 % MT LIQD
1.0000 | OROMUCOSAL | 0 refills | Status: DC | PRN
Start: 2020-11-18 — End: 2020-12-10

## 2020-11-18 MED ORDER — GUAIFENESIN ER 600 MG PO TB12: 600.0000 mg | ORAL_TABLET | Freq: Two times a day (BID) | ORAL | 2 refills | Status: DC

## 2020-11-18 MED ORDER — GUAIFENESIN ER 600 MG PO TB12
600.0000 mg | ORAL_TABLET | Freq: Two times a day (BID) | ORAL | Status: DC
  Administered 2020-11-18 (×2): 600 mg via ORAL
  Filled 2020-11-18 (×2): qty 1

## 2020-11-18 NOTE — Progress Notes (Signed)
Physical Therapy Treatment Patient Details Name: Kathleen [name] MRN: [id number] DOB: 03/21/1946 Today's Date: 11/18/2020    History of Present Illness 75 yo F w/ PMH of chronic hypoxic resp failure on 3L at home, persistent A.fib, combined systolic/diastolic CHF, asthma/COPD overlap, OSA, OHS presenting to [facility] with complaint of cough and dyspnea. Admitted 6/9 with dx of community acquired pneumonia.    PT Comments    Pt admitted with above diagnosis. Pt was able to ambulate without device with good stability in room.  Sats 90% and > on 4LO2.  Pt able to ambulate 80 feet before rest break. Has rollator where she can rest at A living when she desires.  She is progressing.  Feel pt could gain endurance if she goes to SNF first as she will get more therapy there at [facility].  Will continue to follow acutely.  Pt currently with functional limitations due to balance and endurance deficits. Pt will benefit from skilled PT to increase their independence and safety with mobility to allow discharge to the venue listed below.      Follow Up Recommendations  SNF (for endurance training)     Equipment Recommendations  None recommended by PT    Recommendations for Other Services       Precautions / Restrictions Precautions Precautions: Fall;Other (comment) Precaution Comments: watch sats Restrictions Weight Bearing Restrictions: No    Mobility  Bed Mobility Overal bed mobility: Needs Assistance             General bed mobility comments: Pt sitting EOB on arrival.    Transfers Overall transfer level: Needs assistance Equipment used: 1 person hand held assist Transfers: Sit to/from Stand Sit to Stand: Min guard Stand pivot transfers: Min guard       General transfer comment: Requires increased time/effort. Quick to fatigue.  Ambulation/Gait Ambulation/Gait assistance: Supervision Gait Distance (Feet): 80 Feet Assistive device: None Gait Pattern/deviations: Step-through  pattern;Decreased stride length Gait velocity: decreased Gait velocity interpretation: <1.31 ft/sec, indicative of household ambulator General Gait Details: amb on 4L with SpO2 90% and greater. Encouraged pursed lip breathing.  Fatigues quickly. Overall did well but does report she has to walk further at her A living to get to dining hall.  Does have rollator.   Stairs             Wheelchair Mobility    Modified Rankin (Stroke Patients Only)       Balance Overall balance assessment: Needs assistance Sitting-balance support: No upper extremity supported;Feet supported Sitting balance-Leahy Scale: Good     Standing balance support: Single extremity supported;No upper extremity supported;During functional activity Standing balance-Leahy Scale: Fair Standing balance comment: static stand and walking without UE support.                            Cognition Arousal/Alertness: Awake/alert Behavior During Therapy: WFL for tasks assessed/performed Overall Cognitive Status: Within Functional Limits for tasks assessed                                        Exercises      General Comments General comments (skin integrity, edema, etc.): SpO2 95% at rest on 4L and desat to 90% with activity on 4L.      Pertinent Vitals/Pain Pain Assessment: No/denies pain    Home Living  Prior Function            PT Goals (current goals can now be found in the care plan section) Acute Rehab PT Goals Patient Stated Goal: feel better Progress towards PT goals: Progressing toward goals    Frequency    Min 3X/week      PT Plan Current plan remains appropriate    Co-evaluation              AM-PAC PT "6 Clicks" Mobility   Outcome Measure  Help needed turning from your back to your side while in a flat bed without using bedrails?: None Help needed moving from lying on your back to sitting on the side of a flat bed  without using bedrails?: A Little Help needed moving to and from a bed to a chair (including a wheelchair)?: A Little Help needed standing up from a chair using your arms (e.g., wheelchair or bedside chair)?: A Little Help needed to walk in hospital room?: A Little Help needed climbing 3-5 steps with a railing? : A Lot 6 Click Score: 18    End of Session Equipment Utilized During Treatment: Gait belt;Oxygen Activity Tolerance: Patient limited by fatigue Patient left: with call bell/phone within reach;in bed (sitting EOB per pt preference) Nurse Communication: Mobility status PT Visit Diagnosis: Difficulty in walking, not elsewhere classified (R26.2);Muscle weakness (generalized) (M62.81)     Time: 4665-9935 PT Time Calculation (min) (ACUTE ONLY): 23 min  Charges:  $Gait Training: 23-37 mins                     [name],PT Acute Rehab Services [phone] (pager)    [name] 11/18/2020, 11:09 AM

## 2020-11-18 NOTE — Discharge Summary (Signed)
Name: Kathleen [name] MRN: [id number] DOB: 1945-07-15 75 y.o. PCP: Marguarite Arbour, MD  Date of Admission: 11/14/2020  9:17 AM Date of Discharge: 11/18/20 Attending Physician: Dr. Heide Spark  Discharge Diagnosis: Active Problems:   Community acquired pneumonia   Discharge Medications: Allergies as of 11/18/2020       Reactions   Allopurinol Other (See Comments)   Reaction:  Dizziness    Clindamycin Anaphylaxis, Hives   Pneumococcal 13-val Conj Vacc Itching, Swelling, Rash   Dronedarone Rash   Entresto [sacubitril-valsartan] Other (See Comments)   hypotension   Fosamax [alendronate Sodium] Nausea Only   Meperidine Nausea And Vomiting   Penicillins Hives, Other (See Comments)   Has patient had a PCN reaction causing immediate rash, facial/tongue/throat swelling, SOB or lightheadedness with hypotension: No Has patient had a PCN reaction causing severe rash involving mucus membranes or skin necrosis: No Has patient had a PCN reaction that required hospitalization No Has patient had a PCN reaction occurring within the last 10 years: No If all of the above answers are "NO", then may proceed with Cephalosporin use.   Rosuvastatin Other (See Comments)   Reaction:  Muscle spasms    Tetracycline Hives   Adhesive [tape] Rash   Lovastatin Rash   Other reaction(s): Muscle Pain        Medication List     STOP taking these medications    azithromycin 250 MG tablet Commonly known as: ZITHROMAX   cefUROXime 500 MG tablet Commonly known as: CEFTIN       TAKE these medications    Advair HFA 230-21 MCG/ACT inhaler Generic drug: fluticasone-salmeterol Inhale 2 puffs into the lungs in the morning and at bedtime.   albuterol 108 (90 Base) MCG/ACT inhaler Commonly known as: VENTOLIN HFA Inhale 2 puffs into the lungs every 4 (four) hours as needed for wheezing or shortness of breath.   azelastine 0.1 % nasal spray Commonly known as: ASTELIN Place 2 sprays into both nostrils  2 (two) times daily.   cefdinir 300 MG capsule Commonly known as: OMNICEF Take 1 capsule (300 mg total) by mouth 2 (two) times daily for 2 days.   colchicine 0.6 MG tablet Take 1 tablet (0.6 mg total) by mouth daily as needed (for gout flares).   cyanocobalamin 1000 MCG/ML injection Commonly known as: (VITAMIN B-12) Inject 1,000 mcg into the muscle every 30 (thirty) days.   docusate sodium 100 MG capsule Commonly known as: COLACE Take 100 mg by mouth daily.   dofetilide 250 MCG capsule Commonly known as: TIKOSYN Take 250 mcg by mouth in the morning and at bedtime.   Entresto 24-26 MG Generic drug: sacubitril-valsartan Take 1 tablet by mouth 2 (two) times daily.   EPINEPHrine 0.3 mg/0.3 mL Soaj injection Commonly known as: EPI-PEN Inject 0.3 mLs into the muscle as needed for anaphylaxis.   ezetimibe 10 MG tablet Commonly known as: ZETIA Take 10 mg by mouth at bedtime.   febuxostat 40 MG tablet Commonly known as: ULORIC Take 40 mg by mouth at bedtime.   guaiFENesin 600 MG 12 hr tablet Commonly known as: Mucinex Take 1 tablet (600 mg total) by mouth 2 (two) times daily.   guaiFENesin-codeine 100-10 MG/5ML syrup Take 10 mLs by mouth every 6 (six) hours as needed for cough.   levothyroxine 50 MCG tablet Commonly known as: SYNTHROID Take 50 mcg by mouth daily before breakfast.   Magnesium 500 MG Tabs Take 500 mg by mouth every evening.   nystatin powder Generic  drug: nystatin Apply 1 application topically 2 (two) times daily as needed (rash).   pantoprazole 40 MG tablet Commonly known as: PROTONIX Take 1 tablet (40 mg total) by mouth daily.   phenol 1.4 % Liqd Commonly known as: CHLORASEPTIC Use as directed 1 spray in the mouth or throat as needed for throat irritation / pain.   polyethylene glycol 17 g packet Commonly known as: MIRALAX / GLYCOLAX Take 17 g by mouth daily as needed for moderate constipation.   PREDNISONE PO Take 7.5 mg by mouth daily.    rivaroxaban 20 MG Tabs tablet Commonly known as: XARELTO Take 20 mg by mouth daily with supper.   simethicone 80 MG chewable tablet Commonly known as: MYLICON Chew 1 tablet (80 mg total) by mouth 4 (four) times daily.   sodium chloride HYPERTONIC 3 % nebulizer solution Take 4 mLs by nebulization 2 (two) times daily.   Spiriva Respimat 2.5 MCG/ACT Aers Generic drug: Tiotropium Bromide Monohydrate Inhale 2 puffs into the lungs daily.   spironolactone 25 MG tablet Commonly known as: ALDACTONE Take 25 mg by mouth daily.   torsemide 20 MG tablet Commonly known as: DEMADEX Take 20 mg by mouth once a week.   Vitamin D3 50 MCG (2000 UT) Tabs Take 2,000 Units by mouth daily.               Durable Medical Equipment  (From admission, onward)           Start     Ordered   11/18/20 1429  DME Oxygen  Once       Question Answer Comment  Length of Need 6 Months   Mode or (Route) Nasal cannula   Liters per Minute 4   Frequency Continuous (stationary and portable oxygen unit needed)   Oxygen delivery system Gas      11/18/20 1435           Disposition and follow-up:   Ms.Kathleen [name] was discharged from [facility] in Stable condition.  At the hospital follow up visit please address:  1.  Follow-up:  a.  Hypoxia-follow-up oxygen supplementation    b.  History of tracheobronchomalacia/COPD- follow up pulmonologist   c. Cough - Ensure improvement or cough medicine helping   d. OHS/OSA - Continue nightly CPaP  2.  Labs / imaging needed at time of follow-up: CBC, CMP, TSH  3.  Pending labs/ test needing follow-up: None  4.  Medication Changes  Started: guaifenesin, guaifenesin-codeine  Abx - Cefdinir End Date: 06/15  Follow-up Appointments:  Follow-up Information     Sparks, Duane Lope, MD Follow up.   Specialty: Internal Medicine Contact information: [address] [place] [address] [phone]         [facility]  Pulmonary Care. Schedule an appointment as soon as possible for a visit.   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  X-ray consistent with left lower lobe opacity.  She failed multiple outpatient treatments prior to her admission, she failed 5 days of azithromycin and 6 days of cefuroxime and.  She was also given 5 days of high-dose steroids for COPD exacerbation.  Upon admission she presented with leukocytosis  that improved as well as her shortness of breath.  She was started on cough suppressants as well as continued on home prednisone, duo nebs, Spiriva, Advair.  She improved during her hospitalization on IV antibiotics and she was continued on cefdinir at discharge.  She was evaluated by PT and OT who recommended SNF placement.  She required increased oxygen supplementation.  I believe that this is from deconditioning over the past few weeks since the patient has not been feeling well.  Discussed that she will need close follow-up with her pulmonologist, she is due establish care with [facility] pulmonology.  Requested the patient to call their office and schedule an appointment as soon as possible.  She will be discharged to SNF facility today.  OHS/OSA Combined systolic diastolic heart failure Patient is on home BiPAP.  I do not believe that she was having any types of CHF exacerbation.  She was continued on her home torsemide.  There were 2 evenings where she did not wear her BiPAP because she did not have her home 1.  We will continue her on this at discharge.  Patient has her own BiPAP machine to take to her SNF facility.  Discharge Subjective: Patient states  that she is doing better overall.  She feels as though when she has coughing spells her oxygen levels dropped and she worries about her tracheobronchial malacia closing.  Discussed with her that we will be writing her for cough medicine.  Discharge Exam:   BP 109/81 (BP Location: Right Arm)   Pulse 82   Temp 97.9 F (36.6 C) (Oral)   Resp 20   Ht 5' 1.5" (1.562 m)   Wt 103.1 kg   SpO2 94%   BMI 42.25 kg/m  Constitutional: resting on edge of bed, in NAD HENT: normocephalic atraumatic,  in place Eyes: conjunctiva non-erythematous Neck: supple Cardiovascular: regular rate and rhythm, no m/r/g Pulmonary/Chest: normal work of breathing on 4 L nasal cannula, mild crackles at the bases bilaterally Abdominal: soft, non-tender, non-distended MSK: normal bulk and tone Neurological: alert & oriented x 3 Skin: warm and dry Psych: Normal mood and thought process  Pertinent Labs, Studies, and Procedures:  CBC Latest Ref Rng & Units 11/18/2020 11/17/2020 11/16/2020  WBC 4.0 - 10.5 K/uL 11.0(H) 8.7 7.6  Hemoglobin 12.0 - 15.0 g/dL 40.9 81.1 91.4  Hematocrit 36.0 - 46.0 % 39.6 41.0 37.7  Platelets 150 - 400 K/uL 173 156 169    CMP Latest Ref Rng & Units 11/18/2020 11/17/2020 11/16/2020  Glucose 70 - 99 mg/dL 782(N) 562(Z) 308(M)  BUN 8 - 23 mg/dL 18 14 12   Creatinine 0.44 - 1.00 mg/dL 5.78 4.69  Sodium 135 - 145 mmol/L 138 138 139  Potassium 3.5 - 5.1 mmol/L 4.0 4.0 3.8  Chloride 98 - 111 mmol/L 103 105 105  CO2 22 - 32 mmol/L 27 24 25   Calcium 8.9 - 10.3 mg/dL 9.7 9.4 8.9  Total Protein 6.5 - 8.1 g/dL - 5.6(L) 4.9(L)  Total Bilirubin 0.3 - 1.2 mg/dL - 0.9 0.7  Alkaline Phos 38 - 126 U/L - 39 34(L)  AST 15 - 41 U/L - 15 16  ALT 0 - 44 U/L - 15 12    DG Chest 2 View  Result Date: 11/14/2020 CLINICAL DATA:  Shortness of breath.  Cough, and fever EXAM: CHEST - 2 VIEW COMPARISON:  11/07/2020 FINDINGS: Left chest wall pacer is noted with leads in the right atrial appendage and right  ventricle. Heart size appears  normal. Compared with the previous exam there is been interval increase in opacification to the left lung base concerning for airspace disease. Right lung is clear. No signs of pleural effusion or edema. IMPRESSION: Left lower lobe airspace opacity compatible with pneumonia. Electronically Signed   By: Signa Kell M.D.   On: 11/14/2020 10:09     Discharge Instructions: Discharge Instructions     Call MD for:  difficulty breathing, headache or visual disturbances   Complete by: As directed    Call MD for:  extreme fatigue   Complete by: As directed    Call MD for:  hives   Complete by: As directed    Call MD for:  persistant dizziness or light-headedness   Complete by: As directed    Call MD for:  persistant nausea and vomiting   Complete by: As directed    Call MD for:  redness, tenderness, or signs of infection (pain, swelling, redness, odor or green/yellow discharge around incision site)   Complete by: As directed    Call MD for:  severe uncontrolled pain   Complete by: As directed    Call MD for:  temperature >100.4   Complete by: As directed    Diet - low sodium heart healthy   Complete by: As directed    Increase activity slowly   Complete by: As directed        Signed: Belva Agee, MD 11/18/2020, 2:39 PM   Pager: [phone]

## 2020-11-18 NOTE — Discharge Instructions (Signed)
Kathleen [name]  Thank you for allowing me to care for you during this hospitalization.  You came in quite short of breath with a lot of coughing and mucus.  I am glad that you are doing better.  We started you on antibiotics as well as strong cough medicine, we will continue these on an outpatient basis.  You will complete a course of antibiotics.  If you find yourself getting short of breath and having coughing fits try to take long slow deep breaths in through her nose and out through her mouth.  Please call your pulmonology's office and try to get a sooner follow-up appointment.  I believe that the past few weeks have been very hard on you and have made you quite fatigued.  He will be working with physical therapy at the rehab facility to become stronger and hopefully require less oxygen as time goes on.  I am glad you are feeling better overall and wish you the best,  Dr. Kirtland Bouchard

## 2020-11-18 NOTE — Plan of Care (Signed)

## 2020-11-18 NOTE — Care Management Important Message (Signed)
Important Message  Patient Details  Name: Kathleen [name] MRN: [id number] Date of Birth: Sep 06, 1945   Medicare Important Message Given:  Yes     [name] [name] 11/18/2020, 3:35 PM

## 2020-11-18 NOTE — TOC Transition Note (Signed)
Transition of Care [facility]) - CM/SW Discharge Note   Patient Details  Name: Kathleen [name] MRN: [id number] Date of Birth: 17-May-1946  Transition of Care [facility]) CM/SW Contact:  Lorri Frederick, LCSW Phone Number: 11/18/2020, 3:39 PM   Clinical Narrative:   Pt discharging to [facility].  RN call report to [phone].    Final next level of care: Skilled Nursing Facility Barriers to Discharge: Barriers Resolved   Patient Goals and CMS Choice Patient states their goals for this hospitalization and ongoing recovery are:: Rehab CMS Medicare.gov Compare Post Acute Care list provided to:: Patient Choice offered to / list presented to : Patient  Discharge Placement              Patient chooses bed at:  [facility]) Patient to be transferred to facility by: PTAR Name of family member notified: unable to reach son, spoke with pt and she will call son's wife as son is out of town Patient and family notified of of transfer: 11/18/20  Discharge Plan and Services In-house Referral: Clinical Social Work   Post Acute Care Choice: Skilled Nursing Facility                               Social Determinants of Health (SDOH) Interventions     Readmission Risk Interventions Readmission Risk Prevention Plan 06/28/2019  Transportation Screening Complete  PCP or Specialist Appt within 3-5 Days Complete  HRI or Home Care Consult Complete  Palliative Care Screening Not Applicable  Medication Review (RN Care Manager) Complete  Some recent data might be hidden

## 2020-11-18 NOTE — Progress Notes (Signed)
Pt refusing bipap for the night. RT will continue to monitor as needed. ?

## 2020-11-18 NOTE — Progress Notes (Signed)
Nutrition Brief Note  MD consulted RD to assess patient's nutrition status.  Spoke with pt at bedside. Pt reports eating well at home and is concerned with the amount of food she just purchased that will likely go to waste before she gets back home. Pt reports eating well at the hospital, reporting the food tasting pretty good.  Pt denies any changes in weight. See below for weight changes in Epic. Wt Readings from Last 15 Encounters:  11/18/20 103.1 kg  11/07/20 101.6 kg  06/26/19 96.2 kg  01/25/19 99.6 kg  07/26/18 98.3 kg  06/06/18 98.5 kg  04/26/18 100.8 kg  04/05/18 100.8 kg  03/30/18 100 kg  10/23/17 99.3 kg  05/13/17 95.3 kg  03/09/16 96 kg  09/23/15 96.5 kg  09/08/15 95.7 kg  07/19/15 98.9 kg   On exam, pt has no depletions. Of note, pt has mild edema in BLE.  Body mass index is 42.25 kg/m. Patient meets criteria for Obesity Class 3 based on current BMI.   Current diet order is [employer], patient is consuming approximately 75-100% of meals at this time. Labs and medications reviewed.   No nutrition interventions warranted at this time. If nutrition issues arise, please consult RD.   Vertell Limber, RD, LDN Registered Dietitian I After-Hours/Weekend Pager # in [place]

## 2020-11-20 ENCOUNTER — Telehealth: Payer: Self-pay

## 2020-11-20 NOTE — Telephone Encounter (Signed)
Received fax from patient's pharmacy which indicates the cough medicine, Codeine-Guaifenesin, which was prescribed by Dr. Evie Lacks following patient's in-patient hospitalization is not covered.  The message from the pharmacy indicates the fax provided alternate medications, however, there are no medications listed on the fax.  TC placed to [facility], RN placed on hold for over 12 minutes, RN unable to continue holding.  RN faxed the "Drug Change Request" back to [facility] with instructions to resend with preferred alternatives. SChaplin, RN,BSN

## 2020-12-04 ENCOUNTER — Telehealth (HOSPITAL_COMMUNITY): Payer: Self-pay | Admitting: Internal Medicine

## 2020-12-04 NOTE — Telephone Encounter (Signed)
Pt called to f/u w/referral from [facility], Dr Deboraha Sprang, pt can be reached [phone], please advise

## 2020-12-10 ENCOUNTER — Ambulatory Visit (INDEPENDENT_AMBULATORY_CARE_PROVIDER_SITE_OTHER): Payer: Medicare Other

## 2020-12-10 ENCOUNTER — Encounter: Payer: Self-pay | Admitting: Pulmonary Disease

## 2020-12-10 ENCOUNTER — Ambulatory Visit (INDEPENDENT_AMBULATORY_CARE_PROVIDER_SITE_OTHER): Payer: Medicare Other | Admitting: Pulmonary Disease

## 2020-12-10 ENCOUNTER — Other Ambulatory Visit: Payer: Self-pay

## 2020-12-10 VITALS — BP 128/84 | HR 82 | Temp 98.1°F | Ht 61.5 in | Wt 220.0 lb

## 2020-12-10 DIAGNOSIS — J449 Chronic obstructive pulmonary disease, unspecified: Secondary | ICD-10-CM

## 2020-12-10 DIAGNOSIS — J189 Pneumonia, unspecified organism: Secondary | ICD-10-CM

## 2020-12-10 DIAGNOSIS — J398 Other specified diseases of upper respiratory tract: Secondary | ICD-10-CM

## 2020-12-10 MED ORDER — BENZONATATE 200 MG PO CAPS: 200.0000 mg | ORAL_CAPSULE | Freq: Three times a day (TID) | ORAL | 1 refills | Status: DC | PRN

## 2020-12-10 MED ORDER — PREDNISONE 10 MG PO TABS: ORAL_TABLET | ORAL | 0 refills | Status: DC

## 2020-12-10 NOTE — Telephone Encounter (Signed)
Received fax referral from [facility], referral sent to schedulers to arrange appt.

## 2020-12-10 NOTE — Progress Notes (Signed)
Synopsis: Referred in June 2022 for COPD by Aram Beecham, MD  Subjective:   PATIENT ID: Kathleen [name] GENDER: female DOB: Jun 27, 1945, MRN: [id number]  HPI  Chief Complaint  Patient presents with   Consult    Self referral for COPD. Recently moved to the area and needed to establish with a pulmonologist. Previous pulmonologist was in [place], Dr. Ellwood Sayers at [facility].    Kathleen [name] is 39 woman, former smoker with asthma-COPD overlap syndrome, tracheobronchomalacia, BOOP in 2013 and chronic hypoxemic respiratory failure who is referred to pulmonary clinic today to establish care.   She was previously followed at [facility] and has recently moved to [place].   She was hospitalized at [facility] 11/14/20 to 11/19/20 for treatment of pneumonia and respiratory failure. She was treated with cefuroxime and azithromycin and then with a 5 day course of IM ceftriaxone that was completed last Monday at her rehab center. She was discharged to a skilled nursing facility.   She reports her cough has worsened over the past 3 days, similar to when she was admitted with pneumonia. Prior to the last 3 days she thought her cough was improving. She has minimal sputum production at this time. It is a dry, barking cough. She has been using advair 230-56mcg 2 puffs twice daily and spiriva daily along with albuterol and 3% saline nebulizer treatments 3 times per day. She is also taking 7.5mg  of prednisone daily. She reports being on prednisone over the past 3 years and they have been trying to wean her down.  Office notes from [facility] pulmonology on 11/12/20 reviewed.   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HCC)    CHF (congestive heart failure) (HCC)    Chronic renal insufficiency    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    COPD (chronic obstructive pulmonary disease) (HCC)    Gout     Hypertension    Longstanding persistent atrial fibrillation (HCC)    Nonischemic cardiomyopathy (HCC)    Obesity    Pacemaker    Spontaneous pneumothorax 2013     Family History  Problem Relation Age of Onset   Breast cancer Mother 71       3 different times   Breast cancer Cousin      Social History   Socioeconomic History   Marital status: Widowed    Spouse name: Not on file   Number of children: Not on file   Years of education: Not on file   Highest education level: Not on file  Occupational History   Occupation: retired  Tobacco Use   Smoking status: Former    Pack years: 0.00    Types: Cigarettes    Quit date: 06/09/1987    Years since quitting: 33.5   Smokeless tobacco: Never  Vaping Use   Vaping Use: Never used  Substance and Sexual Activity   Alcohol use: No    Alcohol/week: 0.0 standard drinks   Drug use: No   Sexual activity: Not Currently  Other Topics Concern   Not on file  Social History Narrative   Pt lives in [place] alone.  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  Her son works in [employer].   Social Determinants of Health   Financial Resource Strain: Not on file  Food Insecurity: Not on file  Transportation Needs: Not on file  Physical Activity: Not on file  Stress: Not on file  Social Connections: Not on file  Intimate Partner Violence: Not on file     Allergies  Allergen Reactions   Allopurinol Other (See Comments)    Reaction:  Dizziness    Clindamycin Anaphylaxis and Hives   Pneumococcal 13-Val Conj Vacc Itching, Swelling and Rash   Dronedarone Rash   Entresto [Sacubitril-Valsartan] Other (See Comments)    hypotension   Fosamax [Alendronate Sodium] Nausea Only   Meperidine Nausea And Vomiting   Penicillins Hives and Other (See Comments)    Has patient had a PCN reaction causing immediate rash, facial/tongue/throat swelling, SOB or lightheadedness with hypotension: No Has patient had a PCN reaction causing severe  rash involving mucus membranes or skin necrosis: No Has patient had a PCN reaction that required hospitalization No Has patient had a PCN reaction occurring within the last 10 years: No If all of the above answers are "NO", then may proceed with Cephalosporin use.   Rosuvastatin Other (See Comments)    Reaction:  Muscle spasms    Tetracycline Hives   Adhesive [Tape] Rash   Lovastatin Rash    Other reaction(s): Muscle Pain     Outpatient Medications Prior to Visit  Medication Sig Dispense Refill   albuterol (PROVENTIL HFA;VENTOLIN HFA) 108 (90 Base) MCG/ACT inhaler Inhale 2 puffs into the lungs every 4 (four) hours as needed for wheezing or shortness of breath.     azelastine (ASTELIN) 0.1 % nasal spray Place 2 sprays into both nostrils 2 (two) times daily.     Cholecalciferol (VITAMIN D3) 50 MCG (2000 UT) TABS Take 2,000 Units by mouth daily.     cyanocobalamin (,VITAMIN B-12,) 1000 MCG/ML injection Inject 1,000 mcg into the muscle every 30 (thirty) days.     docusate sodium (COLACE) 100 MG capsule Take 100 mg by mouth daily.      dofetilide (TIKOSYN) 250 MCG capsule Take 250 mcg by mouth in the morning and at bedtime.     ENTRESTO 24-26 MG Take 1 tablet by mouth 2 (two) times daily.     EPINEPHrine 0.3 mg/0.3 mL IJ SOAJ injection Inject 0.3 mLs into the muscle as needed for anaphylaxis.     ezetimibe (ZETIA) 10 MG tablet Take 10 mg by mouth at bedtime.     febuxostat (ULORIC) 40 MG tablet Take 40 mg by mouth at bedtime.     guaiFENesin (MUCINEX) 600 MG 12 hr tablet Take 1 tablet (600 mg total) by mouth 2 (two) times daily. 60 tablet 2   guaiFENesin-codeine 100-10 MG/5ML syrup Take 10 mLs by mouth every 6 (six) hours as needed for cough. 120 mL 0   levothyroxine (SYNTHROID) 50 MCG tablet Take 50 mcg by mouth daily before breakfast.  2   Magnesium 500 MG TABS Take 500 mg by mouth every evening.     pantoprazole (PROTONIX) 40 MG tablet Take 1 tablet (40 mg total) by mouth daily. 30 tablet 0    polyethylene glycol (MIRALAX / GLYCOLAX) 17 g packet Take 17 g by mouth daily as needed for moderate constipation. 30 each 0   PREDNISONE PO Take 7.5 mg by mouth daily.     rivaroxaban (XARELTO) 20 MG TABS tablet Take 20 mg by mouth daily with supper.      simethicone (MYLICON) 80 MG chewable tablet Chew 1 tablet (80 mg total) by mouth 4 (four) times daily. 30 tablet 0   sodium chloride HYPERTONIC 3 % nebulizer solution Take 4  mLs by nebulization 2 (two) times daily.     SPIRIVA RESPIMAT 2.5 MCG/ACT AERS Inhale 2 puffs into the lungs daily.  11   spironolactone (ALDACTONE) 25 MG tablet Take 25 mg by mouth daily.     torsemide (DEMADEX) 20 MG tablet Take 20 mg by mouth once a week.     fluticasone-salmeterol (ADVAIR HFA) 230-21 MCG/ACT inhaler Inhale 2 puffs into the lungs in the morning and at bedtime.     colchicine 0.6 MG tablet Take 1 tablet (0.6 mg total) by mouth daily as needed (for gout flares).     NYSTATIN powder Apply 1 application topically 2 (two) times daily as needed (rash).     phenol (CHLORASEPTIC) 1.4 % LIQD Use as directed 1 spray in the mouth or throat as needed for throat irritation / pain. 20 mL 0   No facility-administered medications prior to visit.    Review of Systems  Constitutional:  Negative for chills, fever, malaise/fatigue and weight loss.  HENT:  Negative for congestion, sinus pain and sore throat.   Eyes: Negative.   Respiratory:  Positive for cough, shortness of breath and wheezing. Negative for hemoptysis and sputum production.   Cardiovascular:  Negative for chest pain, palpitations, orthopnea, claudication and leg swelling.  Gastrointestinal:  Negative for abdominal pain, heartburn, nausea and vomiting.  Genitourinary: Negative.   Musculoskeletal:  Negative for joint pain and myalgias.  Skin:  Negative for rash.  Neurological:  Negative for weakness.  Endo/Heme/Allergies: Negative.   Psychiatric/Behavioral: Negative.       Objective:   Vitals:    12/10/20 0857  BP: 128/84  Pulse: 82  Temp: 98.1 F (36.7 C)  TempSrc: Oral  SpO2: 96%  Weight: 220 lb (99.8 kg)  Height: 5' 1.5" (1.562 m)     Physical Exam Constitutional:      General: She is not in acute distress.    Appearance: She is obese. She is not ill-appearing.  HENT:     Head: Normocephalic and atraumatic.  Eyes:     General: No scleral icterus.    Conjunctiva/sclera: Conjunctivae normal.     Pupils: Pupils are equal, round, and reactive to light.  Cardiovascular:     Rate and Rhythm: Normal rate and regular rhythm.     Pulses: Normal pulses.     Heart sounds: Normal heart sounds. No murmur heard. Pulmonary:     Effort: Pulmonary effort is normal.     Breath sounds: Rhonchi (scattered, bilateral) present. No wheezing or rales.  Abdominal:     General: Bowel sounds are normal.     Palpations: Abdomen is soft.  Musculoskeletal:     Right lower leg: No edema.     Left lower leg: No edema.  Lymphadenopathy:     Cervical: No cervical adenopathy.  Skin:    General: Skin is warm and dry.  Neurological:     General: No focal deficit present.     Mental Status: She is alert.  Psychiatric:        Mood and Affect: Mood normal.        Behavior: Behavior normal.        Thought Content: Thought content normal.        Judgment: Judgment normal.    CBC    Component Value Date/Time   WBC 11.0 (H) 11/18/2020 0505   RBC 4.51 11/18/2020 0505   HGB 12.6 11/18/2020 0505   HGB 13.4 12/28/2012 0748   HCT 39.6 11/18/2020 0505  HCT 39.2 12/28/2012 0748   PLT 173 11/18/2020 0505   PLT 186 12/28/2012 0748   MCV 87.8 11/18/2020 0505   MCV 87 12/28/2012 0748   MCH 27.9 11/18/2020 0505   MCHC 31.8 11/18/2020 0505   RDW 14.7 11/18/2020 0505   RDW 15.0 (H) 12/28/2012 0748   LYMPHSABS 1.4 11/18/2020 0505   LYMPHSABS 0.4 (L) 12/28/2012 0748   MONOABS 0.8 11/18/2020 0505   MONOABS 0.5 12/28/2012 0748   EOSABS 0.2 11/18/2020 0505   EOSABS 0.0 12/28/2012 0748    BASOSABS 0.1 11/18/2020 0505   BASOSABS 0.0 12/28/2012 0748     Chest imaging: CT Chest ILD protocol 11/07/2020 locally: Lungs/Pleura: Centrilobular emphsyema noted. Tiny right middle lobe  perifissural nodules are stable since 2017 consistent with benign etiology. There is a new 6 mm  irregular nodule in the posterior right middle lobe visible. Clip device noted along vasculature of the left lower lobe.  Clustered small nodules are seen in the left lower lobe measuring up to 5 mm. 1. New 6 mm irregular nodule in the posterior right middle lobe.  Additional clustered small nodules in the left lower lobe are likely  infectious/inflammatory. Non-contrast chest CT at 3-6 months is  recommended. If the nodules are stable at time of repeat CT, then  future CT at 18-24 months (from today's scan) is considered optional  for low-risk patients, but is recommended for high-risk patients.  This recommendation follows the consensus statement: Guidelines for  Management of Incidental Pulmonary Nodules Detected on CT Images:  From the Fleischner Society 2017; Radiology 2017; 284:228-243.  2. Subtle nodularity of hepatic contour raises the question of  underlying cirrhosis.  3. Aortic Atherosclerosis (ICD10-I70.0) and Emphysema (ICD10-J43.9). PFT: No flowsheet data found. Pulmonary Function Test (PFT) Latest Ref Rng & Units 03/29/2013 11/27/2014 11/25/2016 11/10/2017 12/07/2019 01/23/2020 05/28/2020  FVC PRE L 2.08 2.1 2 2.02 2.01 1.80 1.94  FVC % PRE PRED % 77 78.6 76.2 80.3 - - -  FEV1 PRE L 1.29 1.38 1.33 1.15 1.25 1.03 0.99  FEV1 % PRE PRED % 61 66.2 65.5 59.4 - - -  FEV1/FVC PRE % 62 65.62 66.48 57.12 62.28 57.21 51.18  TLC PRE L 3.48 - - 3.85 - - -  TLC % PRE PRED % 74 - - 83.4 - - -  RV PRE L 1.38 - - 1.83 - - -  RV % PRE PRED % 69 - - 89.7 - - -  DLCO PRE ml/(min*mmHg) 7.7 - - 8.37 - - -  DLCO % PRE PRED % 44 - - 49.6 - - -  FEF25-75% PRE L/s 0.49 0.72 0.72 0.43 0.54 0.41 0.35  FEF25-75% % PRE  PRED % 24 35.5 37.5 23.2 - - -   6 minute walk test (01/23/2020): The patient's resting oxygen saturation is 96% with a HR of 80. With exertion her lowest oxygen saturation was 92% on room air. Her peak HR was 103. She walked a total of 53m which was 25% predicted.    Assessment & Plan:   Asthma-COPD overlap syndrome (HCC)  Tracheobronchomalacia  Community acquired pneumonia, unspecified laterality - Plan: DG Chest 2 View  Discussion: Avalynne Diver is 74 woman, former smoker with asthma-COPD overlap syndrome, tracheobronchomalacia, BOOP in 2013 and chronic hypoxemic respiratory failure who is referred to pulmonary clinic today to establish care.   She continues to experience cough with minimal sputum production with associated shortness of breath and wheezing.  She has diffuse rhonchi on exam.  Given she has taken multiple rounds of antibiotics recently her pneumonia should be appropriately treated.  We will treat her for prolonged asthma exacerbation and provide her with a steroid taper.  She is to continue Breztri inhaler 2 puffs twice daily and as needed albuterol inhaler or nebulizer treatments.  She is to stop 3% saline nebulizer treatments as this could be aggravating to her airways and leading to worsening of her cough.  She is to continue supplemental oxygen use until we are able to get her cough symptoms under better control and then we will assess her for the ability to safely come off oxygen as she was not previously on oxygen prior to her recent hospital admission.   Follow up in 4 weeks.  >60 minutes was spent on this visit which included chart review of her records from [facility], recent hospitalization, direct patient care and documentation.   Melody Comas, MD [facility] Office: [phone]   Current Outpatient Medications:    albuterol (PROVENTIL HFA;VENTOLIN HFA) 108 (90 Base) MCG/ACT inhaler, Inhale 2 puffs into the lungs every 4 (four) hours as  needed for wheezing or shortness of breath., Disp: , Rfl:    azelastine (ASTELIN) 0.1 % nasal spray, Place 2 sprays into both nostrils 2 (two) times daily., Disp: , Rfl:    benzonatate (TESSALON) 200 MG capsule, Take 1 capsule (200 mg total) by mouth 3 (three) times daily as needed for cough., Disp: 30 capsule, Rfl: 1   Cholecalciferol (VITAMIN D3) 50 MCG (2000 UT) TABS, Take 2,000 Units by mouth daily., Disp: , Rfl:    cyanocobalamin (,VITAMIN B-12,) 1000 MCG/ML injection, Inject 1,000 mcg into the muscle every 30 (thirty) days., Disp: , Rfl:    docusate sodium (COLACE) 100 MG capsule, Take 100 mg by mouth daily. , Disp: , Rfl:    dofetilide (TIKOSYN) 250 MCG capsule, Take 250 mcg by mouth in the morning and at bedtime., Disp: , Rfl:    ENTRESTO 24-26 MG, Take 1 tablet by mouth 2 (two) times daily., Disp: , Rfl:    EPINEPHrine 0.3 mg/0.3 mL IJ SOAJ injection, Inject 0.3 mLs into the muscle as needed for anaphylaxis., Disp: , Rfl:    ezetimibe (ZETIA) 10 MG tablet, Take 10 mg by mouth at bedtime., Disp: , Rfl:    febuxostat (ULORIC) 40 MG tablet, Take 40 mg by mouth at bedtime., Disp: , Rfl:    guaiFENesin (MUCINEX) 600 MG 12 hr tablet, Take 1 tablet (600 mg total) by mouth 2 (two) times daily., Disp: 60 tablet, Rfl: 2   guaiFENesin-codeine 100-10 MG/5ML syrup, Take 10 mLs by mouth every 6 (six) hours as needed for cough., Disp: 120 mL, Rfl: 0   levothyroxine (SYNTHROID) 50 MCG tablet, Take 50 mcg by mouth daily before breakfast., Disp: , Rfl: 2   Magnesium 500 MG TABS, Take 500 mg by mouth every evening., Disp: , Rfl:    pantoprazole (PROTONIX) 40 MG tablet, Take 1 tablet (40 mg total) by mouth daily., Disp: 30 tablet, Rfl: 0   polyethylene glycol (MIRALAX / GLYCOLAX) 17 g packet, Take 17 g by mouth daily as needed for moderate constipation., Disp: 30 each, Rfl: 0   predniSONE (DELTASONE) 10 MG tablet, Take 4 tabs by mouth for 3 days, then 3 for 3 days, 2 for 3 days, 1 for 3 days and stop, Disp: 30  tablet, Rfl: 0   PREDNISONE PO, Take 7.5 mg by mouth daily., Disp: , Rfl:    rivaroxaban (  XARELTO) 20 MG TABS tablet, Take 20 mg by mouth daily with supper. , Disp: , Rfl:    simethicone (MYLICON) 80 MG chewable tablet, Chew 1 tablet (80 mg total) by mouth 4 (four) times daily., Disp: 30 tablet, Rfl: 0   sodium chloride HYPERTONIC 3 % nebulizer solution, Take 4 mLs by nebulization 2 (two) times daily., Disp: , Rfl:    SPIRIVA RESPIMAT 2.5 MCG/ACT AERS, Inhale 2 puffs into the lungs daily., Disp: , Rfl: 11   spironolactone (ALDACTONE) 25 MG tablet, Take 25 mg by mouth daily., Disp: , Rfl:    torsemide (DEMADEX) 20 MG tablet, Take 20 mg by mouth once a week., Disp: , Rfl:    fluticasone-salmeterol (ADVAIR HFA) 230-21 MCG/ACT inhaler, Inhale 2 puffs into the lungs in the morning and at bedtime., Disp: , Rfl:

## 2020-12-10 NOTE — Patient Instructions (Addendum)
Use advair 230-69mcg 2 puffs twice daily  Use spiriva 1 puff daily  Stop using the 3% saline nebs.  Continue albuterol nebulizer treatments throughout the day, every 4-6 hours as needed.   Take prednisone: 40mg  x 4 days 30mg  x 3 days 20mg  x 3 days 10mg  x 3 days Return to baseline 7.5mg  daily there after  We will get a chest radiograph today  Continue supplemental oxygen and bipap therapy.   Continue tessalon perles as needed for your cough symptoms.   Use flutter valve 2-3 times per day after albuterol nebulizer  Use incentive spirometry 2-3 times per day

## 2020-12-11 NOTE — Telephone Encounter (Signed)
Dr. Francine Graven please advise on following My Chart message:   I saw you yesterday and you said you wanted to put me on an antibiotic but didn't know which one since I am on tikosyn  I contacted Dr Sela Hilding, my electrophysiologist at [facility]. He suggested cepalosporin, Of course, any antibiotic you want me to be on, you can check with the pharmacy about it having an interaction with tikosyn. I did look at the xray and saw there is still infection, Also mentioned parital colapse of lung.  The xray would prove that to be the left lung. Please let me know if you still want me to be on an antibiotic! Thank you!      Thank you

## 2020-12-12 MED ORDER — CEFPODOXIME PROXETIL 200 MG PO TABS: 200.0000 mg | ORAL_TABLET | Freq: Two times a day (BID) | ORAL | 0 refills | Status: AC

## 2020-12-12 NOTE — Telephone Encounter (Signed)
Patient checking on Dr. Nickola Major. Patient phone number is [phone].

## 2020-12-26 ENCOUNTER — Encounter (HOSPITAL_COMMUNITY): Payer: Medicare Other | Admitting: Cardiology

## 2020-12-27 ENCOUNTER — Telehealth: Payer: Self-pay | Admitting: Pulmonary Disease

## 2020-12-27 NOTE — Telephone Encounter (Signed)
Called and spoke with patient. She verbalized understanding. She will call us back if she develops any increased SOB or if the cough changes. She is aware that she can call back during the weekend to speak with the on call provider and not wait until Monday morning.   Nothing further needed at time of call.

## 2020-12-27 NOTE — Telephone Encounter (Signed)
Called and spoke with patient. She stated that the NP at [facility] listened to her lungs yesterday and stated it sounded like the fluid was back in her lungs, especially the left lung. She noticed that she has been wheezing more for the past 2-3 days but it has not affected her daily routine. She has a productive cough but has not seen any color to the phlegm. Denied any fevers or body aches.   She just finished the prednisone taper from JD today (she was the 40mg  x3 days, 30mg  x3 days, etc taper). She will also finish the cefpodoxime 200mg  today as well.   The NP at [facility] suggested that she needed more prednisone to last until her appt with JD on 01/07/21.   Pharmacy is [facility] on .   RB, can you please advise since JD is not available today? Thanks!

## 2020-12-27 NOTE — Telephone Encounter (Signed)
I would finish both medications, wait to see whether her symptoms begin to worsen before committing to a repeat course prednisone. She needs to call us next week if she notices a decline off prednisone and abx.

## 2021-01-01 ENCOUNTER — Telehealth: Payer: Self-pay | Admitting: Pulmonary Disease

## 2021-01-01 NOTE — Telephone Encounter (Signed)
Called and spoke with pt stating to her that we can get her in for an appt tomorrow 7/28 at 10:45 with JD for reevaluation. Pt said that she is scheduled for a f/u wth JD Mon. 8/1 but I stated to her that I would just reschedule her appt to tomorrow and she verbalized understanding. Pt's appt has been changed to tomorrow 7/28 at 10:45 with JD. Nothing further needed.

## 2021-01-02 ENCOUNTER — Encounter: Payer: Self-pay | Admitting: Pulmonary Disease

## 2021-01-02 ENCOUNTER — Ambulatory Visit (INDEPENDENT_AMBULATORY_CARE_PROVIDER_SITE_OTHER): Payer: Medicare Other | Admitting: Pulmonary Disease

## 2021-01-02 ENCOUNTER — Other Ambulatory Visit: Payer: Self-pay

## 2021-01-02 VITALS — BP 126/84 | HR 77 | Ht 61.5 in | Wt 224.6 lb

## 2021-01-02 DIAGNOSIS — J449 Chronic obstructive pulmonary disease, unspecified: Secondary | ICD-10-CM

## 2021-01-02 DIAGNOSIS — J398 Other specified diseases of upper respiratory tract: Secondary | ICD-10-CM | POA: Diagnosis not present

## 2021-01-02 DIAGNOSIS — J849 Interstitial pulmonary disease, unspecified: Secondary | ICD-10-CM | POA: Diagnosis not present

## 2021-01-02 NOTE — Progress Notes (Addendum)
Synopsis: Referred in June 2022 for COPD by Aram Beecham, MD  Subjective:   PATIENT ID: Kathleen [name] GENDER: female DOB: 1945/06/19, MRN: [id number]  HPI  Chief Complaint  Patient presents with   Follow-up    3 week f/u. States she has noticed that her SOB has increased over the last week. A NP came to her house last week and noticed that her lungs did not sound clear. Finished abx last Friday. Still has a productive cough with dark yellow phlegm.    Kathleen [name] is a 75 year old woman, former smoker with asthma-COPD overlap syndrome, tracheobronchomalacia, BOOP in 2013 and chronic hypoxemic respiratory failure who returns to pulmonary clinic for follow up.   Since last visit she continues to complain of cough, shortness of breath and wheezing. She was treated with prolonged prednisone taper. She reports her breathing was somewhat better but once she started getting to the lower doses of prednisone her symptoms returned. We also stopped the 3% hypertonic saline nebs due to airway irritation.   She is accompanied by her son today.   OV 12/10/20 She was previously followed at [facility] and has recently moved to [place].   She was hospitalized at [facility] 11/14/20 to 11/19/20 for treatment of pneumonia and respiratory failure. She was treated with cefuroxime and azithromycin and then with a 5 day course of IM ceftriaxone that was completed last Monday at her rehab center. She was discharged to a skilled nursing facility.   She reports her cough has worsened over the past 3 days, similar to when she was admitted with pneumonia. Prior to the last 3 days she thought her cough was improving. She has minimal sputum production at this time. It is a dry, barking cough. She has been using advair 230-71mcg 2 puffs twice daily and spiriva daily along with albuterol and 3% saline nebulizer treatments 3 times per day. She is also taking 7.5mg  of prednisone daily. She reports being on prednisone  over the past 3 years and they have been trying to wean her down.  Office notes from [facility] pulmonology on 11/12/20 reviewed.   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HCC)    CHF (congestive heart failure) (HCC)    Chronic renal insufficiency    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    COPD (chronic obstructive pulmonary disease) (HCC)    Gout    Hypertension    Longstanding persistent atrial fibrillation (HCC)    Nonischemic cardiomyopathy (HCC)    Obesity    Pacemaker    Spontaneous pneumothorax 2013     Family History  Problem Relation Age of Onset   Breast cancer Mother 11       3 different times   Breast cancer Cousin      Social History   Socioeconomic History   Marital status: Widowed    Spouse name: Not on file   Number of children: Not on file   Years of education: Not on file   Highest education level: Not on file  Occupational History   Occupation: retired  Tobacco Use   Smoking status: Former    Types: Cigarettes    Quit date: 06/09/1987    Years since quitting: 33.6   Smokeless tobacco: Never  Vaping Use   Vaping Use: Never used  Substance and Sexual Activity   Alcohol use: No  Alcohol/week: 0.0 standard drinks   Drug use: No   Sexual activity: Not Currently  Other Topics Concern   Not on file  Social History Narrative   Pt lives in [place] alone.  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  Her son works in [employer].   Social Determinants of Health   Financial Resource Strain: Not on file  Food Insecurity: Not on file  Transportation Needs: Not on file  Physical Activity: Not on file  Stress: Not on file  Social Connections: Not on file  Intimate Partner Violence: Not on file     Allergies  Allergen Reactions   Allopurinol Other (See Comments)    Reaction:  Dizziness    Clindamycin Anaphylaxis and Hives    Pneumococcal 13-Val Conj Vacc Itching, Swelling and Rash   Dronedarone Rash   Entresto [Sacubitril-Valsartan] Other (See Comments)    hypotension   Fosamax [Alendronate Sodium] Nausea Only   Meperidine Nausea And Vomiting   Penicillins Hives and Other (See Comments)    Has patient had a PCN reaction causing immediate rash, facial/tongue/throat swelling, SOB or lightheadedness with hypotension: No Has patient had a PCN reaction causing severe rash involving mucus membranes or skin necrosis: No Has patient had a PCN reaction that required hospitalization No Has patient had a PCN reaction occurring within the last 10 years: No If all of the above answers are "NO", then may proceed with Cephalosporin use.   Rosuvastatin Other (See Comments)    Reaction:  Muscle spasms    Tetracycline Hives   Adhesive [Tape] Rash   Lovastatin Rash    Other reaction(s): Muscle Pain     Outpatient Medications Prior to Visit  Medication Sig Dispense Refill   albuterol (PROVENTIL HFA;VENTOLIN HFA) 108 (90 Base) MCG/ACT inhaler Inhale 2 puffs into the lungs every 4 (four) hours as needed for wheezing or shortness of breath.     azelastine (ASTELIN) 0.1 % nasal spray Place 2 sprays into both nostrils 2 (two) times daily.     benzonatate (TESSALON) 200 MG capsule Take 1 capsule (200 mg total) by mouth 3 (three) times daily as needed for cough. 30 capsule 1   Cholecalciferol (VITAMIN D3) 50 MCG (2000 UT) TABS Take 2,000 Units by mouth daily.     cyanocobalamin (,VITAMIN B-12,) 1000 MCG/ML injection Inject 1,000 mcg into the muscle every 30 (thirty) days.     docusate sodium (COLACE) 100 MG capsule Take 100 mg by mouth daily.      dofetilide (TIKOSYN) 250 MCG capsule Take 250 mcg by mouth in the morning and at bedtime.     ENTRESTO 24-26 MG Take 1 tablet by mouth 2 (two) times daily.     EPINEPHrine 0.3 mg/0.3 mL IJ SOAJ injection Inject 0.3 mLs into the muscle as needed for anaphylaxis.     ezetimibe (ZETIA) 10 MG  tablet Take 10 mg by mouth at bedtime.     febuxostat (ULORIC) 40 MG tablet Take 40 mg by mouth at bedtime.     guaiFENesin (MUCINEX) 600 MG 12 hr tablet Take 1 tablet (600 mg total) by mouth 2 (two) times daily. 60 tablet 2   guaiFENesin-codeine 100-10 MG/5ML syrup Take 10 mLs by mouth every 6 (six) hours as needed for cough. 120 mL 0   levothyroxine (SYNTHROID) 50 MCG tablet Take 50 mcg by mouth daily before breakfast.  2   Magnesium 500 MG TABS Take 500 mg by mouth every evening.  pantoprazole (PROTONIX) 40 MG tablet Take 1 tablet (40 mg total) by mouth daily. 30 tablet 0   polyethylene glycol (MIRALAX / GLYCOLAX) 17 g packet Take 17 g by mouth daily as needed for moderate constipation. 30 each 0   PREDNISONE PO Take 7.5 mg by mouth daily.     rivaroxaban (XARELTO) 20 MG TABS tablet Take 20 mg by mouth daily with supper.      simethicone (MYLICON) 80 MG chewable tablet Chew 1 tablet (80 mg total) by mouth 4 (four) times daily. 30 tablet 0   sodium chloride HYPERTONIC 3 % nebulizer solution Take 4 mLs by nebulization 2 (two) times daily.     SPIRIVA RESPIMAT 2.5 MCG/ACT AERS Inhale 2 puffs into the lungs daily.  11   spironolactone (ALDACTONE) 25 MG tablet Take 25 mg by mouth daily.     torsemide (DEMADEX) 20 MG tablet Take 20 mg by mouth once a week.     fluticasone-salmeterol (ADVAIR HFA) 230-21 MCG/ACT inhaler Inhale 2 puffs into the lungs in the morning and at bedtime.     predniSONE (DELTASONE) 10 MG tablet Take 4 tabs by mouth for 3 days, then 3 for 3 days, 2 for 3 days, 1 for 3 days and stop 30 tablet 0   No facility-administered medications prior to visit.    Review of Systems  Constitutional:  Negative for chills, fever, malaise/fatigue and weight loss.  HENT:  Negative for congestion, sinus pain and sore throat.   Eyes: Negative.   Respiratory:  Positive for cough, shortness of breath and wheezing. Negative for hemoptysis and sputum production.   Cardiovascular:  Negative for  chest pain, palpitations, orthopnea, claudication and leg swelling.  Gastrointestinal:  Negative for abdominal pain, heartburn, nausea and vomiting.  Genitourinary: Negative.   Musculoskeletal:  Negative for joint pain and myalgias.  Skin:  Negative for rash.  Neurological:  Negative for weakness.  Endo/Heme/Allergies: Negative.   Psychiatric/Behavioral: Negative.       Objective:   Vitals:   01/02/21 1100  BP: 126/84  Pulse: 77  SpO2: 96%  Weight: 224 lb 9.6 oz (101.9 kg)  Height: 5' 1.5" (1.562 m)     Physical Exam Constitutional:      General: She is not in acute distress.    Appearance: She is obese. She is not ill-appearing.  HENT:     Head: Normocephalic and atraumatic.  Eyes:     General: No scleral icterus.    Conjunctiva/sclera: Conjunctivae normal.     Pupils: Pupils are equal, round, and reactive to light.  Cardiovascular:     Rate and Rhythm: Normal rate and regular rhythm.     Pulses: Normal pulses.     Heart sounds: Normal heart sounds. No murmur heard. Pulmonary:     Effort: Pulmonary effort is normal.     Breath sounds: No wheezing, rhonchi or rales.  Abdominal:     General: Bowel sounds are normal.     Palpations: Abdomen is soft.  Musculoskeletal:     Right lower leg: No edema.     Left lower leg: No edema.  Lymphadenopathy:     Cervical: No cervical adenopathy.  Skin:    General: Skin is warm and dry.  Neurological:     General: No focal deficit present.     Mental Status: She is alert.  Psychiatric:        Mood and Affect: Mood normal.        Behavior: Behavior normal.  Thought Content: Thought content normal.        Judgment: Judgment normal.    CBC    Component Value Date/Time   WBC 11.0 (H) 11/18/2020 0505   RBC 4.51 11/18/2020 0505   HGB 12.6 11/18/2020 0505   HGB 13.4 12/28/2012 0748   HCT 39.6 11/18/2020 0505   HCT 39.2 12/28/2012 0748   PLT 173 11/18/2020 0505   PLT 186 12/28/2012 0748   MCV 87.8 11/18/2020 0505    MCV 87 12/28/2012 0748   MCH 27.9 11/18/2020 0505   MCHC 31.8 11/18/2020 0505   RDW 14.7 11/18/2020 0505   RDW 15.0 (H) 12/28/2012 0748   LYMPHSABS 1.4 11/18/2020 0505   LYMPHSABS 0.4 (L) 12/28/2012 0748   MONOABS 0.8 11/18/2020 0505   MONOABS 0.5 12/28/2012 0748   EOSABS 0.2 11/18/2020 0505   EOSABS 0.0 12/28/2012 0748   BASOSABS 0.1 11/18/2020 0505   BASOSABS 0.0 12/28/2012 0748     Chest imaging: CXR 12/10/20 Not much interval change in radiographic appearance of the chest since 11/14/2020. Small moderate left pleural effusion with airspace disease at the lingula and left base, atelectasis versus pneumonia. Streaky right infrahilar airspace disease as well.  CT Chest ILD protocol 11/07/2020 locally: Lungs/Pleura: Centrilobular emphsyema noted. Tiny right middle lobe  perifissural nodules are stable since 2017 consistent with benign etiology. There is a new 6 mm  irregular nodule in the posterior right middle lobe visible. Clip device noted along vasculature of the left lower lobe.  Clustered small nodules are seen in the left lower lobe measuring up to 5 mm. 1. New 6 mm irregular nodule in the posterior right middle lobe.  Additional clustered small nodules in the left lower lobe are likely  infectious/inflammatory. Non-contrast chest CT at 3-6 months is  recommended. If the nodules are stable at time of repeat CT, then  future CT at 18-24 months (from today's scan) is considered optional  for low-risk patients, but is recommended for high-risk patients.  This recommendation follows the consensus statement: Guidelines for  Management of Incidental Pulmonary Nodules Detected on CT Images:  From the Fleischner Society 2017; Radiology 2017; 284:228-243.  2. Subtle nodularity of hepatic contour raises the question of  underlying cirrhosis.  3. Aortic Atherosclerosis (ICD10-I70.0) and Emphysema (ICD10-J43.9). PFT: No flowsheet data found. Pulmonary Function Test (PFT) Latest  Ref Rng & Units 03/29/2013 11/27/2014 11/25/2016 11/10/2017 12/07/2019 01/23/2020 05/28/2020  FVC PRE L 2.08 2.1 2 2.02 2.01 1.80 1.94  FVC % PRE PRED % 77 78.6 76.2 80.3 - - -  FEV1 PRE L 1.29 1.38 1.33 1.15 1.25 1.03 0.99  FEV1 % PRE PRED % 61 66.2 65.5 59.4 - - -  FEV1/FVC PRE % 62 65.62 66.48 57.12 62.28 57.21 51.18  TLC PRE L 3.48 - - 3.85 - - -  TLC % PRE PRED % 74 - - 83.4 - - -  RV PRE L 1.38 - - 1.83 - - -  RV % PRE PRED % 69 - - 89.7 - - -  DLCO PRE ml/(min*mmHg) 7.7 - - 8.37 - - -  DLCO % PRE PRED % 44 - - 49.6 - - -  FEF25-75% PRE L/s 0.49 0.72 0.72 0.43 0.54 0.41 0.35  FEF25-75% % PRE PRED % 24 35.5 37.5 23.2 - - -   6 minute walk test (01/23/2020): The patient's resting oxygen saturation is 96% with a HR of 80. With exertion her lowest oxygen saturation was 92% on room air. Her peak  HR was 103. She walked a total of 1m which was 25% predicted.    Assessment & Plan:   Asthma-COPD overlap syndrome (HCC)  Tracheobronchomalacia - Plan: CT Chest High Resolution, Bipap titration, Desensitization mask fit  Interstitial pulmonary disease (HCC) - Plan: CT Chest High Resolution, ANA, ANCA screen with reflex titer, Anti-DNA antibody, double-stranded, Anti-scleroderma antibody, Anti-Smith antibody, Centromere Antibodies, CK total and CKMB (cardiac)not at [facility], C-reactive protein, Cyclic citrul peptide antibody, IgG, Rheumatoid factor, RNP Antibodies, Sjogren's syndrome antibods(ssa + ssb), Sjogren's syndrome antibods(ssa + ssb), RNP Antibodies, Rheumatoid factor, Cyclic citrul peptide antibody, IgG, C-reactive protein, CK total and CKMB (cardiac)not at [facility], Centromere Antibodies, Anti-Smith antibody, Anti-scleroderma antibody, Anti-DNA antibody, double-stranded, ANCA screen with reflex titer, ANA  Discussion: Kathleen [name] is a 75 year old woman, former smoker with asthma-COPD overlap syndrome, tracheobronchomalacia, BOOP in 2013 and chronic hypoxemic respiratory failure who returns to  pulmonary clinic for follow up.  She continues to have barking cough, wheezing and shortness of breath. Her lungs are clear on auscultation today compared to the rhonchi on previous visit. We will hold off on further steroids or antibiotics at this time. She is to continue her inhaler therapy and as needed albuterol treatments. She is to continue bipap at night.   We will check a high resolution CT chest scan given her history of organizing pneumonia. We will also check inflammatory labs today.  We will schedule her for in lab bipap titration study and refer her for a mask fitting.   She is to continue supplemental oxygen use until we are able to get her cough symptoms under better control and then we will assess her for the ability to safely come off oxygen as she was not previously on oxygen prior to her recent hospital admission.   Follow up in 3 months.  Melody Comas, MD [facility] Office: [phone]  Addendum 03/07/21: Patient did not improve much with trelegy ellipta inhaler. We will transition her to nebulizer treatments with budesonide twice daily, brovana twice daily and yupelri daily.  JBD   Current Outpatient Medications:    albuterol (PROVENTIL HFA;VENTOLIN HFA) 108 (90 Base) MCG/ACT inhaler, Inhale 2 puffs into the lungs every 4 (four) hours as needed for wheezing or shortness of breath., Disp: , Rfl:    azelastine (ASTELIN) 0.1 % nasal spray, Place 2 sprays into both nostrils 2 (two) times daily., Disp: , Rfl:    benzonatate (TESSALON) 200 MG capsule, Take 1 capsule (200 mg total) by mouth 3 (three) times daily as needed for cough., Disp: 30 capsule, Rfl: 1   Cholecalciferol (VITAMIN D3) 50 MCG (2000 UT) TABS, Take 2,000 Units by mouth daily., Disp: , Rfl:    cyanocobalamin (,VITAMIN B-12,) 1000 MCG/ML injection, Inject 1,000 mcg into the muscle every 30 (thirty) days., Disp: , Rfl:    docusate sodium (COLACE) 100 MG capsule, Take 100 mg by mouth  daily. , Disp: , Rfl:    dofetilide (TIKOSYN) 250 MCG capsule, Take 250 mcg by mouth in the morning and at bedtime., Disp: , Rfl:    ENTRESTO 24-26 MG, Take 1 tablet by mouth 2 (two) times daily., Disp: , Rfl:    EPINEPHrine 0.3 mg/0.3 mL IJ SOAJ injection, Inject 0.3 mLs into the muscle as needed for anaphylaxis., Disp: , Rfl:    ezetimibe (ZETIA) 10 MG tablet, Take 10 mg by mouth at bedtime., Disp: , Rfl:    febuxostat (ULORIC) 40 MG tablet, Take 40 mg by mouth at  bedtime., Disp: , Rfl:    guaiFENesin (MUCINEX) 600 MG 12 hr tablet, Take 1 tablet (600 mg total) by mouth 2 (two) times daily., Disp: 60 tablet, Rfl: 2   guaiFENesin-codeine 100-10 MG/5ML syrup, Take 10 mLs by mouth every 6 (six) hours as needed for cough., Disp: 120 mL, Rfl: 0   levothyroxine (SYNTHROID) 50 MCG tablet, Take 50 mcg by mouth daily before breakfast., Disp: , Rfl: 2   Magnesium 500 MG TABS, Take 500 mg by mouth every evening., Disp: , Rfl:    pantoprazole (PROTONIX) 40 MG tablet, Take 1 tablet (40 mg total) by mouth daily., Disp: 30 tablet, Rfl: 0   polyethylene glycol (MIRALAX / GLYCOLAX) 17 g packet, Take 17 g by mouth daily as needed for moderate constipation., Disp: 30 each, Rfl: 0   PREDNISONE PO, Take 7.5 mg by mouth daily., Disp: , Rfl:    rivaroxaban (XARELTO) 20 MG TABS tablet, Take 20 mg by mouth daily with supper. , Disp: , Rfl:    simethicone (MYLICON) 80 MG chewable tablet, Chew 1 tablet (80 mg total) by mouth 4 (four) times daily., Disp: 30 tablet, Rfl: 0   sodium chloride HYPERTONIC 3 % nebulizer solution, Take 4 mLs by nebulization 2 (two) times daily., Disp: , Rfl:    SPIRIVA RESPIMAT 2.5 MCG/ACT AERS, Inhale 2 puffs into the lungs daily., Disp: , Rfl: 11   spironolactone (ALDACTONE) 25 MG tablet, Take 25 mg by mouth daily., Disp: , Rfl:    torsemide (DEMADEX) 20 MG tablet, Take 20 mg by mouth once a week., Disp: , Rfl:    fluticasone-salmeterol (ADVAIR HFA) 230-21 MCG/ACT inhaler, Inhale 2 puffs into  the lungs in the morning and at bedtime., Disp: , Rfl:

## 2021-01-02 NOTE — Patient Instructions (Signed)
We will check inflammatory labs today and schedule you for a high resolution CT chest scan for further evalution.   Continue current dose of prednisone 7.5mg  daily  Continue Breztri inhaler 2 puffs twice daily  Continue nebulizer treatments as needed.

## 2021-01-03 LAB — RNP ANTIBODIES: ENA RNP Ab: <0.2 AI (ref 0.0–0.9)

## 2021-01-03 LAB — C-REACTIVE PROTEIN: CRP: <1 mg/dL (ref 0.5–20.0)

## 2021-01-07 ENCOUNTER — Encounter: Payer: Self-pay | Admitting: Pulmonary Disease

## 2021-01-07 ENCOUNTER — Other Ambulatory Visit: Payer: Self-pay

## 2021-01-07 ENCOUNTER — Encounter (HOSPITAL_COMMUNITY): Payer: Self-pay

## 2021-01-07 ENCOUNTER — Ambulatory Visit: Payer: Medicare Other | Admitting: Pulmonary Disease

## 2021-01-07 ENCOUNTER — Ambulatory Visit (HOSPITAL_COMMUNITY)
Admission: RE | Admit: 2021-01-07 | Discharge: 2021-01-07 | Disposition: A | Discharge status: Home / Self Care | Payer: Medicare Other | Source: Ambulatory Visit | Attending: Pulmonary Disease | Admitting: Pulmonary Disease

## 2021-01-07 DIAGNOSIS — J398 Other specified diseases of upper respiratory tract: Secondary | ICD-10-CM | POA: Insufficient documentation

## 2021-01-07 DIAGNOSIS — J849 Interstitial pulmonary disease, unspecified: Secondary | ICD-10-CM | POA: Insufficient documentation

## 2021-01-08 LAB — SJOGREN'S SYNDROME ANTIBODS(SSA + SSB)
SSA (Ro) (ENA) Antibody, IgG: <1 AI
SSB (La) (ENA) Antibody, IgG: <1 AI

## 2021-01-08 LAB — ANCA SCREEN W REFLEX TITER: ANCA Screen: NEGATIVE

## 2021-01-08 LAB — ANTI-SCLERODERMA ANTIBODY: Scleroderma (Scl-70) (ENA) Antibody, IgG: <1 AI

## 2021-01-08 LAB — CK TOTAL AND CKMB (NOT AT ARMC)

## 2021-01-08 LAB — ANTI-DNA ANTIBODY, DOUBLE-STRANDED: ds DNA Ab: <1 IU/mL

## 2021-01-08 LAB — CYCLIC CITRUL PEPTIDE ANTIBODY, IGG: Cyclic Citrullin Peptide Ab: <16 UNITS

## 2021-01-08 LAB — ANA: Anti Nuclear Antibody (ANA): NEGATIVE

## 2021-01-08 LAB — ANTI-SMITH ANTIBODY: ENA SM Ab Ser-aCnc: <1 AI

## 2021-01-08 LAB — CENTROMERE ANTIBODIES: Centromere Ab Screen: <1 AI

## 2021-01-08 LAB — RHEUMATOID FACTOR: Rheumatoid fact SerPl-aCnc: <14 IU/mL (ref <14)

## 2021-01-12 NOTE — Telephone Encounter (Signed)
JD please advise. Thanks   Kathleen [name] [facility] Hi, I said something to my heart doctor about the bronchialmalacia that you had called me about. He did not know any specialty but set me up with an appointment with Dr Barb Merino, who does stints. He is a pulmonologist located in the [facility].  I would like you to let me know where I can find information on bronchiamalacia being treated at [facility]? Also, please make a reservation with another doctor if you think they are actually able to discuss my problem.  The appointment I have is for September 7th.. I can always cancel it.  Thank you.-Kathleen [name]

## 2021-01-13 NOTE — Telephone Encounter (Signed)
I spoke with the pt  Advised that I have faxed over copy of her CT chest and ov note to Dr Barb Merino  She states that sjhe has little to no energy  Breathing is unchanged  Still using her o2 and sats have been 92% or above on 4lpm  She is scheduled to see her PCP 01/15/21 about her fatigue  Nothing further needed at this time

## 2021-01-14 ENCOUNTER — Telehealth: Payer: Self-pay | Admitting: Pulmonary Disease

## 2021-01-14 NOTE — Telephone Encounter (Signed)
I have called and LM on VM for Kathleen [name] to call me back about this order.

## 2021-01-14 NOTE — Telephone Encounter (Signed)
Kathleen [name] has left for the day will leave in triage and try back tomorrow.   LMTCB with patient to see where she had sleep study done so we can obtain a copy. Upon reviewing Media patient had a sleep study done in 2015 via [facility].

## 2021-01-15 ENCOUNTER — Telehealth: Payer: Self-pay | Admitting: Pulmonary Disease

## 2021-01-15 DIAGNOSIS — J398 Other specified diseases of upper respiratory tract: Secondary | ICD-10-CM

## 2021-01-15 NOTE — Telephone Encounter (Signed)
Called and spoke with patient, states she has had 3 sleep studies that were all done in the sleep lab, the most recent one, about 6 years ago was done in [place], ordered by the pulmonologist she was seeing at the time.  It was done in a Hotel, the entire floor was reserved for the sleep test, however, it was prom season and she did not rest well d/t the screaming of the teenagers at prom.  The other 2 studies were done in [facility] near [place] at a sleep center there.  This is all the detail she could provide.  She states they were all negative for sleep apnea.  Dr. Francine Graven, Her last sleep study was done on 09/23/2013 at [facility].  The results are in Care Everywhere.  I printed out a copy and will put it in her box for your review.  Thank you.

## 2021-01-15 NOTE — Telephone Encounter (Signed)
Lmtcb for [place].  

## 2021-01-15 NOTE — Telephone Encounter (Signed)
Spoke with Kathleen [name]  He states since the pt has never tried CPAP he wonders if she should start with CPAP titration rather than BIPAP She is scheduled for BIPAP titration 8/11 at 8 pm Thanks

## 2021-01-15 NOTE — Telephone Encounter (Signed)
Spoke with Marita Kansas at [facility] at [facility], he needed clarification regarding the titration study for the patient scheduled tonight.  He wanted to know Dr. Francine Graven wanted the patient started out on CPAP and then transitioned to BIPAP if that was what was needed.  I messaged Dr. Francine Graven and he stated to start on CPAP and transition to BIPAP if needed.  New order placed for CPAP titration with instructions per Dr. Francine Graven.  Also included the reason for the positive pressure is not d/t sleep disordered breathing, it is needed d/t her tracheobronchomalacia.  Nothing further needed.

## 2021-01-15 NOTE — Telephone Encounter (Signed)
[facility], no answer, had to [facility].

## 2021-01-15 NOTE — Telephone Encounter (Signed)
She needs positive airway pressure at night due to her tracheobronchomalacia rather than any sleep disordered breathing.  She should still follow through with the CPAP or BiPAP titration that has been scheduled for her this month.  Thanks, jon

## 2021-01-15 NOTE — Telephone Encounter (Signed)
Per my last office note patient told me she has been on CPAP therapy in the past and currently has a BiPAP at the facility she is at.  I am trying to qualify her for CPAP or BiPAP for her history of tracheobronchomalacia not necessarily obstructive sleep apnea.  I am okay with what ever device she can be fitted for at the titration study as long as she it is comfortable and she is able to tolerate it when sleeping.  She needs positive airway pressure support at night for tracheobronchomalacia.  Thanks,  Cletis Athens

## 2021-01-16 ENCOUNTER — Encounter (HOSPITAL_BASED_OUTPATIENT_CLINIC_OR_DEPARTMENT_OTHER): Payer: Medicare Other | Admitting: Pulmonary Disease

## 2021-01-19 ENCOUNTER — Ambulatory Visit (HOSPITAL_BASED_OUTPATIENT_CLINIC_OR_DEPARTMENT_OTHER): Payer: Medicare Other | Attending: Pulmonary Disease | Admitting: Pulmonary Disease

## 2021-01-19 DIAGNOSIS — Z79899 Other long term (current) drug therapy: Secondary | ICD-10-CM | POA: Insufficient documentation

## 2021-01-19 DIAGNOSIS — I4892 Unspecified atrial flutter: Secondary | ICD-10-CM | POA: Insufficient documentation

## 2021-01-19 DIAGNOSIS — J9612 Chronic respiratory failure with hypercapnia: Secondary | ICD-10-CM | POA: Insufficient documentation

## 2021-01-19 DIAGNOSIS — J449 Chronic obstructive pulmonary disease, unspecified: Secondary | ICD-10-CM | POA: Diagnosis not present

## 2021-01-19 DIAGNOSIS — J398 Other specified diseases of upper respiratory tract: Secondary | ICD-10-CM | POA: Diagnosis present

## 2021-01-19 DIAGNOSIS — J9611 Chronic respiratory failure with hypoxia: Secondary | ICD-10-CM | POA: Diagnosis not present

## 2021-01-19 DIAGNOSIS — G4730 Sleep apnea, unspecified: Secondary | ICD-10-CM | POA: Diagnosis not present

## 2021-01-20 DIAGNOSIS — J9611 Chronic respiratory failure with hypoxia: Secondary | ICD-10-CM

## 2021-01-20 DIAGNOSIS — J398 Other specified diseases of upper respiratory tract: Secondary | ICD-10-CM

## 2021-01-20 DIAGNOSIS — J9612 Chronic respiratory failure with hypercapnia: Secondary | ICD-10-CM | POA: Diagnosis not present

## 2021-01-20 NOTE — Procedures (Signed)
Patient Name: Kathleen [name], Kathleen [name] Date: 01/19/2021 Gender: Female D.O.B: [birth year] Age (years): 75 Referring Provider: Melody Comas Height (inches): 61 Interpreting Physician: Cyril Mourning MD, ABSM Weight (lbs): 220 RPSGT: Cherylann Parr BMI: 42 MRN: [id number] Neck Size: 16.00 <br> <br> CLINICAL INFORMATION The patient is referred for a BiPAP titration to treat sleep apnea.  H/o asthma-COPD overlap syndrome, tracheobronchomalacia, BOOP in 2013 and chronic hypoxemic respiratory failure . PAP was recommended for tracheobronchomalacia  Date of NPSG, Split Night or HST: none available.   SLEEP STUDY TECHNIQUE As per the AASM Manual for the Scoring of Sleep and Associated Events v2.3 (April 2016) with a hypopnea requiring 4% desaturations.  The channels recorded and monitored were frontal, central and occipital EEG, electrooculogram (EOG), submentalis EMG (chin), nasal and oral airflow, thoracic and abdominal wall motion, anterior tibialis EMG, snore microphone, electrocardiogram, and pulse oximetry. Bilevel positive airway pressure (BPAP) was initiated at the beginning of the study and titrated to treat sleep-disordered breathing.  MEDICATIONS Medications self-administered by patient taken the night of the study : Tikosin, Entresto  RESPIRATORY PARAMETERS Optimal IPAP Pressure (cm): 12 AHI at Optimal Pressure (/hr) 0 Optimal EPAP Pressure (cm): 8   Overall Minimal O2 (%): 87.0 Minimal O2 at Optimal Pressure (%): 89.0 SLEEP ARCHITECTURE Start Time: 11:01:15 PM Stop Time: 5:08:06 AM Total Time (min): 366.8 Total Sleep Time (min): 332.5 Sleep Latency (min): 1.4 Sleep Efficiency (%): 90.6% REM Latency (min): 153.0 WASO (min): 33.0 Stage N1 (%): 2.4% Stage N2 (%): 88.4% Stage N3 (%): 0.0% Stage R (%): 9.2 Supine (%): 4.22 Arousal Index (/hr): 6.1     CARDIAC DATA The 2 lead EKG demonstrated sinus rhythm, pacemaker generated. The mean heart rate was 71.4 beats per minute. Other EKG  findings include: Atrial Flutter. LEG MOVEMENT DATA The total Periodic Limb Movements of Sleep (PLMS) were 0. The PLMS index was 0.0. A PLMS index of <15 is considered normal in adults.  IMPRESSIONS - An optimal PAP pressure was selected for this patient ( 12 / 8 cm of water). CPAP was not tolerated due to pressure & switched to BiPAP for better tolerance - Central sleep apnea was not noted during this titration (CAI = 0.4/h). - Mild oxygen desaturations were observed during this titration (min O2 = 87.0%). - No snoring was audible during this study. - 2-lead EKG demonstrated: Atrial Flutter - Clinically significant periodic limb movements were not noted during this study. Arousals associated with PLMs were rare.   DIAGNOSIS - Tracheobroncomalacia - Chronic hypoxic and hypercarbic respiratory failure   RECOMMENDATIONS - Trial of BiPAP therapy on 12/8 cm H2O with a Small size Resmed Full Face Mask AirFit F30 mask and heated humidification. - Avoid alcohol, sedatives and other CNS depressants that may worsen sleep apnea and disrupt normal sleep architecture. - Sleep hygiene should be reviewed to assess factors that may improve sleep quality. - Weight management and regular exercise should be initiated or continued. - Return to [facility] for re-evaluation after 4 weeks of therapy   Cyril Mourning MD Board Certified in Sleep medicine

## 2021-01-21 NOTE — Telephone Encounter (Signed)
Pt had a bipap titration on 01/19/2021. Will close encounter.

## 2021-01-22 ENCOUNTER — Encounter: Payer: Self-pay | Admitting: Internal Medicine

## 2021-01-22 ENCOUNTER — Ambulatory Visit (INDEPENDENT_AMBULATORY_CARE_PROVIDER_SITE_OTHER): Payer: Medicare Other | Admitting: Internal Medicine

## 2021-01-22 ENCOUNTER — Other Ambulatory Visit: Payer: Self-pay

## 2021-01-22 VITALS — BP 126/76 | HR 75 | Temp 98.5°F | Resp 20 | Ht 62.0 in | Wt 226.2 lb

## 2021-01-22 DIAGNOSIS — R739 Hyperglycemia, unspecified: Secondary | ICD-10-CM | POA: Insufficient documentation

## 2021-01-22 DIAGNOSIS — G4730 Sleep apnea, unspecified: Secondary | ICD-10-CM | POA: Diagnosis not present

## 2021-01-22 DIAGNOSIS — R400 Somnolence: Secondary | ICD-10-CM | POA: Insufficient documentation

## 2021-01-22 DIAGNOSIS — Z880 Allergy status to penicillin: Secondary | ICD-10-CM

## 2021-01-22 DIAGNOSIS — J410 Simple chronic bronchitis: Secondary | ICD-10-CM

## 2021-01-22 MED ORDER — AMPHETAMINE-DEXTROAMPHETAMINE 10 MG PO TABS: 10.0000 mg | ORAL_TABLET | Freq: Two times a day (BID) | ORAL | 0 refills | Status: DC

## 2021-01-22 NOTE — Progress Notes (Signed)
Subjective:  Patient ID: Kathleen [name], female    DOB: 20-Jun-1945  Age: 75 y.o. MRN: [id number]  CC: New Patient (Initial Visit) (She would  like to discuss her breathing. )  This visit occurred during the SARS-CoV-2 public health emergency.  Safety protocols were in place, including screening questions prior to the visit, additional usage of staff PPE, and extensive cleaning of exam room while observing appropriate contact time as indicated for disinfecting solutions.    HPI Kathleen [name] presents for establishing.  Her previous PCP prescribed Provigil to help keep her awake during the day.  It was too expensive and the insurance company would not pay for it.  She has sleep apnea and is treated with BiPAP.  She gets about 6 or 7 hours of sleep each night.  She complains of excessive daytime sleepiness, napping, and sleep attacks.  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   She has a past surgical history that includes Breast biopsy (Bilateral, 1997); Pacemaker insertion (06/2017); Abdominal hysterectomy; Cholecystectomy; Hernia repair; Cardiac catheterization; Atrial fibrillation ablation (07/20/2013); and AV nodal ablation (11/01/2013).   Her family history includes Breast cancer in her cousin; Breast cancer (age of onset: 28) in her mother.She reports that she quit smoking about 33 years ago. Her smoking use included cigarettes. She has never used smokeless tobacco. She reports that she does not drink alcohol and does not use drugs.  Outpatient Medications  Prior to Visit  Medication Sig Dispense Refill   albuterol (PROVENTIL HFA;VENTOLIN HFA) 108 (90 Base) MCG/ACT inhaler Inhale 2 puffs into the lungs every 4 (four) hours as needed for wheezing or shortness of breath.     benzonatate (TESSALON) 200 MG capsule Take 1 capsule (200 mg total) by mouth 3 (three) times daily as needed for cough. 30 capsule 1   Cholecalciferol (VITAMIN D3) 50 MCG (2000 UT) TABS Take 2,000 Units by mouth daily.     cyanocobalamin (,VITAMIN B-12,) 1000 MCG/ML injection Inject 1,000 mcg into the muscle every 30 (thirty) days.     docusate sodium (COLACE) 100 MG capsule Take 100 mg by mouth daily.      dofetilide (TIKOSYN) 250 MCG capsule Take 250 mcg by mouth in the morning and at bedtime.     ENTRESTO 24-26 MG Take 1 tablet by mouth 2 (two) times daily.     ezetimibe (ZETIA) 10 MG tablet Take 10 mg by mouth at bedtime.     febuxostat (ULORIC) 40 MG tablet Take 40 mg by mouth at bedtime.     ipratropium-albuterol (DUONEB) 0.5-2.5 (3) MG/3ML SOLN Inhale 3 mLs into the lungs in the morning, at noon, in the evening, and at bedtime.     levothyroxine (SYNTHROID) 50 MCG tablet Take 50 mcg by mouth daily before breakfast.  2   Magnesium 500 MG TABS Take 500 mg by mouth every evening.     PREDNISONE PO Take 7.5 mg by mouth daily.     rivaroxaban (XARELTO) 20 MG TABS tablet Take 20 mg by mouth daily with supper.      sodium chloride HYPERTONIC 3 % nebulizer solution Take 4 mLs by nebulization 2 (two) times daily.     SPIRIVA  RESPIMAT 2.5 MCG/ACT AERS Inhale 2 puffs into the lungs daily.  11   spironolactone (ALDACTONE) 25 MG tablet Take 25 mg by mouth daily.     torsemide (DEMADEX) 20 MG tablet Take 20 mg by mouth 2 (two) times a week.     traMADol (ULTRAM) 50 MG tablet Take 50 mg by mouth every 8 (eight) hours as needed.     azelastine (ASTELIN) 0.1 % nasal spray Place 2 sprays into both nostrils 2 (two) times daily. (Patient not taking: Reported on 01/22/2021)     EPINEPHrine 0.3  mg/0.3 mL IJ SOAJ injection Inject 0.3 mLs into the muscle as needed for anaphylaxis. (Patient not taking: Reported on 01/22/2021)     fluticasone-salmeterol (ADVAIR HFA) 230-21 MCG/ACT inhaler Inhale 2 puffs into the lungs in the morning and at bedtime.     pantoprazole (PROTONIX) 40 MG tablet Take 1 tablet (40 mg total) by mouth daily. (Patient not taking: Reported on 01/22/2021) 30 tablet 0   polyethylene glycol (MIRALAX / GLYCOLAX) 17 g packet Take 17 g by mouth daily as needed for moderate constipation. (Patient not taking: Reported on 01/22/2021) 30 each 0   simethicone (MYLICON) 80 MG chewable tablet Chew 1 tablet (80 mg total) by mouth 4 (four) times daily. (Patient not taking: Reported on 01/22/2021) 30 tablet 0   guaiFENesin (MUCINEX) 600 MG 12 hr tablet Take 1 tablet (600 mg total) by mouth 2 (two) times daily. (Patient not taking: Reported on 01/22/2021) 60 tablet 2   guaiFENesin-codeine 100-10 MG/5ML syrup Take 10 mLs by mouth every 6 (six) hours as needed for cough. (Patient not taking: Reported on 01/22/2021) 120 mL 0   magnesium gluconate (MAGONATE) 500 MG tablet Take by mouth.     No facility-administered medications prior to visit.    ROS Review of Systems  Constitutional:  Positive for fatigue. Negative for diaphoresis and unexpected weight change.  HENT: Negative.    Eyes: Negative.   Respiratory:  Positive for apnea, cough and shortness of breath. Negative for chest tightness and wheezing.   Cardiovascular:  Negative for chest pain, palpitations and leg swelling.  Gastrointestinal:  Negative for abdominal pain and diarrhea.  Endocrine: Negative.   Genitourinary: Negative.   Musculoskeletal: Negative.   Skin: Negative.   Neurological:  Negative for dizziness, weakness, light-headedness and headaches.  Hematological:  Negative for adenopathy. Does not bruise/bleed easily.  Psychiatric/Behavioral: Negative.  Negative for decreased concentration, dysphoric mood, hallucinations,  sleep disturbance and suicidal ideas. The patient is not nervous/anxious.    Objective:  BP 126/76   Pulse 75   Temp 98.5 F (36.9 C) (Oral)   Resp 20   Ht 5\' 2"  (1.575 m)   Wt 226 lb 3.2 oz (102.6 kg)   SpO2 98%   BMI 41.37 kg/m   Physical Exam Vitals reviewed.  Constitutional:      Appearance: She is ill-appearing (Continuous O2).  HENT:     Nose: Nose normal.     Mouth/Throat:     Mouth: Mucous membranes are moist.  Eyes:     Conjunctiva/sclera: Conjunctivae normal.  Cardiovascular:     Rate and Rhythm: Normal rate and regular rhythm.     Heart sounds: No murmur heard. Pulmonary:     Effort: Tachypnea and accessory muscle usage present.     Breath sounds: No decreased air movement. No wheezing, rhonchi or rales.  Abdominal:     General: Abdomen is protuberant. Bowel sounds are normal. There is no distension.  Palpations: Abdomen is soft. There is no hepatomegaly, splenomegaly or mass.     Tenderness: There is no guarding.  Musculoskeletal:        General: Normal range of motion.     Cervical back: Neck supple.     Right lower leg: No edema.  Lymphadenopathy:     Cervical: No cervical adenopathy.  Skin:    General: Skin is warm.     Findings: No rash.  Neurological:     General: No focal deficit present.     Mental Status: She is alert.  Psychiatric:        Mood and Affect: Mood normal.        Behavior: Behavior normal.    Lab Results  Component Value Date   WBC 11.0 (H) 11/18/2020   HGB 12.6 11/18/2020   HCT 39.6 11/18/2020   PLT 173 11/18/2020   GLUCOSE 129 (H) 11/18/2020   ALT 15 11/17/2020   AST 15 11/17/2020   NA 138 11/18/2020   K 4.0 11/18/2020   CL 103 11/18/2020   CREATININE 0.76 11/18/2020   BUN 18 11/18/2020   CO2 27 11/18/2020   TSH 1.248 06/27/2019   INR 1.11 09/08/2015   HGBA1C 6.1 (H) 09/11/2015     Assessment & Plan:   Cataleya was seen today for new patient (initial visit).  Diagnoses and all orders for this  visit:  Daytime somnolence -     amphetamine-dextroamphetamine (ADDERALL) 10 MG tablet; Take 1 tablet (10 mg total) by mouth 2 (two) times daily with a meal.  Sleep apnea, unspecified type -     amphetamine-dextroamphetamine (ADDERALL) 10 MG tablet; Take 1 tablet (10 mg total) by mouth 2 (two) times daily with a meal.  I have discontinued Bevelyn Ngo. Fariss's guaiFENesin-codeine, guaiFENesin, and magnesium gluconate. I am also having her start on amphetamine-dextroamphetamine. Additionally, I am having her maintain her levothyroxine, rivaroxaban, torsemide, albuterol, Spiriva Respimat, docusate sodium, febuxostat, Magnesium, polyethylene glycol, simethicone, pantoprazole, Advair HFA, azelastine, cyanocobalamin, dofetilide, EPINEPHrine, ezetimibe, spironolactone, sodium chloride HYPERTONIC, Entresto, Vitamin D3, PREDNISONE PO, benzonatate, traMADol, and ipratropium-albuterol.  Meds ordered this encounter  Medications   amphetamine-dextroamphetamine (ADDERALL) 10 MG tablet    Sig: Take 1 tablet (10 mg total) by mouth 2 (two) times daily with a meal.    Dispense:  180 tablet    Refill:  0     Follow-up: Return in about 3 months (around 04/24/2021).  Sanda Linger, MD

## 2021-01-22 NOTE — Patient Instructions (Signed)
Narcolepsy Narcolepsy is a neurological disorder that causes people to fall asleep suddenly and without control (have sleep attacks) during the daytime. It is a lifelong disorder. Narcolepsy disrupts the sleep cycle at night, which then causes daytime sleepiness. What are the causes? The cause of narcolepsy is not fully understood, but it may be related to: Low levels of hypocretin, a chemical (neurotransmitter) in the brain that controls sleep and wake cycles. Hypocretin imbalance may be caused by: Abnormal genes that are passed from parent to child (inherited). An autoimmune disease in which the body's defense system (immune system) attacks the brain cells that make hypocretin. Infection, tumor, or injury in the area of the brain that controls sleep. Exposure to poisons (toxins), such as heavy metals, pesticides, and secondhand smoke. What are the signs or symptoms? Symptoms of this condition include: Excessive daytime sleepiness. This is the most common symptom and is usually the first symptom you will notice. This may affect your performance at work or school. Sleep attacks. You may fall asleep in the middle of an activity, especially low-energy activities like reading or watching TV. Feeling like you cannot think clearly and trouble focusing or remembering things. You may also feel depressed. Sudden muscle weakness (cataplexy). When this occurs, your speech may become slurred, or your knees may buckle. Cataplexy is usually triggered by surprise, anger, fear, or laughter. Losing the ability to speak or move (sleep paralysis). This may occur just as you start to fall asleep or wake up. You will be aware of the paralysis. It usually lasts for just a few seconds or minutes. Seeing, hearing, tasting, smelling, or feeling things that are not real (hallucinations). Hallucinations may occur with sleep paralysis. They can happen when you are falling asleep, waking up, or dozing. Trouble staying asleep  at night (insomnia) and restless sleep. How is this diagnosed? This condition may be diagnosed based on: A physical exam to rule out any other problems that may be causing your symptoms. You may be asked to write down your sleeping patterns for several weeks in a sleep diary. This will help your health care provider make a diagnosis. Sleep studies that measure how well your REM sleep is regulated. These tests also measure your heart rate, breathing, movement, and brain waves. These tests include: An overnight sleep study (polysomnogram). A daytime sleep study that is done while you take several naps during the day (multiple sleep latency test, MSLT). This test measures how quickly you fall asleep and how quickly you enter REM sleep. Removal of spinal fluid to measure hypocretin levels. How is this treated? There is no cure for this condition, but treatment can help relieve symptoms. Treatment may include: Lifestyle and sleeping strategies to help you cope with the condition, such as: Exercising regularly. Maintaining a regular sleep schedule. Avoiding caffeine and large meals before bed. Medicines. These may include: Medicines that help keep you awake and alert (stimulants) to fight daytime sleepiness. Medicines that treat depression (antidepressants). These may be used to treat cataplexy. Sodium oxybate. This is a strong medicine to help you relax (sedative) that you may take at night. It can help control daytime sleepiness and cataplexy. Other treatments may include mental health counseling or joining a support group. Follow these instructions at home: Sleeping habits  Get about 8 hours of sleep every night. Go to sleep and get up at about the same time every day. Keep your bedroom dark, quiet, and comfortable. When you feel very tired, take short naps. Schedule naps   so that you take them at about the same time every day. Before bedtime: Avoid bright lights and screens. Relax. Try  activities like reading or taking a warm bath. Activity Get at least 20 minutes of exercise every day. This will help you sleep better at night and reduce daytime sleepiness. Avoid exercising within 3 hours of bedtime. Do not drive or use heavy machinery if you are sleepy. If possible, take a nap before driving. Do not swim or go out on the water without a life jacket. Eating and drinking Do not drink alcohol or caffeinated beverages within 4-5 hours of bedtime. Do not eat a large meal before bedtime. Eat meals at about the same times every day. General instructions  Take over-the-counter and prescription medicines only as told by your health care provider. Keep a sleep diary as told by your health care provider. Tell your employer or teachers that you have narcolepsy. You may be able to adjust your schedule to include time for naps. Do not use any products that contain nicotine or tobacco, such as cigarettes, e-cigarettes, and chewing tobacco. If you need help quitting, ask your health care provider. Keep all follow-up visits as told by your health care provider. This is important. Where to find more information National Institute of Neurological Disorders: www.ninds.nih.gov Contact a health care provider if: Your symptoms are not getting better. You have increasingly high blood pressure (hypertension). You have changes in your heart rhythm. You are having a hard time determining what is real and what is not (psychosis). Get help right away if you: Hurt yourself during a sleep attack or an attack of cataplexy. Have chest pain. Have trouble breathing. These symptoms may represent a serious problem that is an emergency. Do not wait to see if the symptoms will go away. Get medical help right away. Call your local emergency services (911 in the [place] Do not drive yourself to the hospital. Summary Narcolepsy is a neurological disorder that causes people to fall asleep suddenly, and without  control, during the daytime (sleep attacks). It is a lifelong disorder. There is no cure for this condition, but treatment can help relieve symptoms. Go to sleep and get up at about the same time every day. Follow instructions about sleep and activities as told by your health care provider. Take over-the-counter and prescription medicines only as told by your health care provider. This information is not intended to replace advice given to you by your health care provider. Make sure you discuss any questions you have with your health care provider. Document Revised: 01/04/2019 Document Reviewed: 01/04/2019 Elsevier Patient Education  2022 Elsevier Inc.  

## 2021-01-23 ENCOUNTER — Other Ambulatory Visit: Payer: Medicare Other

## 2021-01-23 DIAGNOSIS — Z880 Allergy status to penicillin: Secondary | ICD-10-CM | POA: Insufficient documentation

## 2021-01-23 NOTE — Addendum Note (Signed)
Addended by: [name] on: 01/23/2021 07:59 AM   Modules accepted: Orders

## 2021-01-24 ENCOUNTER — Encounter: Payer: Self-pay | Admitting: Family Medicine

## 2021-01-24 ENCOUNTER — Ambulatory Visit (INDEPENDENT_AMBULATORY_CARE_PROVIDER_SITE_OTHER): Payer: Medicare Other | Admitting: Family Medicine

## 2021-01-24 ENCOUNTER — Other Ambulatory Visit: Payer: Self-pay

## 2021-01-24 DIAGNOSIS — M546 Pain in thoracic spine: Secondary | ICD-10-CM | POA: Diagnosis not present

## 2021-01-24 NOTE — Progress Notes (Signed)
Office Visit Note   Patient: Kathleen [name]           Date of Birth: April 18, 1946           MRN: [id number] Visit Date: 01/24/2021 Requested by: Marguarite Arbour, MD [address] [facility] [place],  [place] [address] PCP: [name], MD  Subjective: Chief Complaint  Patient presents with   Middle Back - Pain    Pain around the bottom of the right scapula x at least several weeks. Hurts with breathing. Had xray -- referred here for compression fracture of the Tsp.    HPI: She is here with back pain.  Midthoracic pain for the past 3 or 4 weeks.  No injury, she just woke up with pain 1 day.  It hurts to take a deep breath and to reach behind her back.  She recently had x-rays and there was concern for a compression fracture.               ROS:   All other systems were reviewed and are negative.  Objective: Vital Signs: There were no vitals taken for this visit.  Physical Exam:  General:  Alert and oriented, in no acute distress. Pulm:  Breathing unlabored. Psy:  Normal mood, congruent affect.  Back: She has no rash or bruising.  There is no significant tenderness to palpation of the thoracic spinous processes.  To the right of midline around T9-10 area she has a tender trigger point that seems to reproduce her pain.    Imaging: Unable to view the images she brought on CD.  It did not work properly.    Assessment & Plan: Right-sided thoracic pain, suspect myofascial pain -Discussed with her and elected to inject the symptomatic trigger point today.  If this does not help, then possibly MRI scan.     Procedures: Trigger point injection: After sterile prep with Betadine, injected 3 cc 1% lidocaine without epinephrine and 40 mg Depo-Medrol into the symptomatic trigger point.  She had good relief during the anesthetic phase.       PMFS History: Patient Active Problem List   Diagnosis Date Noted   Penicillin allergy 01/23/2021   Chronic  hyperglycemia 01/22/2021   Daytime somnolence 01/22/2021   Sleep apnea 01/22/2021   Community acquired pneumonia 11/14/2020   Generalized abdominal pain    Constipation    Hypercalcemia 06/26/2019   (HFpEF) heart failure with preserved ejection fraction (HCC) 06/26/2019   Acute kidney injury superimposed on CKD (HCC) 06/26/2019   Chronic systolic heart failure (HCC) 04/06/2018   HTN (hypertension) 04/06/2018   COPD (chronic obstructive pulmonary disease) (HCC) 04/06/2018   Atrial fibrillation (HCC) 04/06/2018   Acute bronchitis 09/17/2015   Malnutrition of moderate degree 09/11/2015   Fever 09/08/2015   Hyponatremia 09/08/2015   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HCC)    CHF (congestive heart failure) (HCC)    Chronic renal insufficiency    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    COPD (chronic obstructive pulmonary disease) (HCC)    Gout    Hypertension    Longstanding persistent atrial fibrillation (HCC)    Nonischemic cardiomyopathy (HCC)    Obesity    Pacemaker    Spontaneous pneumothorax 2013    Family History  Problem Relation Age of Onset   Breast cancer Mother 22  3 different times   Breast cancer Cousin     Past Surgical History:  Procedure Laterality Date   ABDOMINAL HYSTERECTOMY     ATRIAL FIBRILLATION ABLATION  07/20/2013   by Dr Christin Fudge   AV nodal ablation  11/01/2013   by Dr Christin Fudge, repeated by Dr Wilford Grist   BREAST BIOPSY Bilateral 1997   negative   CARDIAC CATHETERIZATION     CHOLECYSTECTOMY     HERNIA REPAIR     PACEMAKER INSERTION  06/2017   MDT Viva CRT-P implanted by Dr Christin Fudge after AV nodal ablation,  LV lead could not be placed   Social History   Occupational History   Occupation: retired  Tobacco Use   Smoking status: Former    Types: Cigarettes    Quit date: 06/09/1987    Years since quitting: 33.6   Smokeless  tobacco: Never  Vaping Use   Vaping Use: Never used  Substance and Sexual Activity   Alcohol use: No    Alcohol/week: 0.0 standard drinks   Drug use: No   Sexual activity: Not Currently

## 2021-01-29 ENCOUNTER — Other Ambulatory Visit: Payer: Self-pay

## 2021-01-29 ENCOUNTER — Ambulatory Visit (INDEPENDENT_AMBULATORY_CARE_PROVIDER_SITE_OTHER): Payer: Medicare Other | Admitting: Allergy

## 2021-01-29 VITALS — BP 110/60 | HR 86 | Temp 97.6°F | Resp 20 | Ht 62.0 in | Wt 218.6 lb

## 2021-01-29 DIAGNOSIS — T50905D Adverse effect of unspecified drugs, medicaments and biological substances, subsequent encounter: Secondary | ICD-10-CM

## 2021-01-29 NOTE — Progress Notes (Signed)
New Patient Note  RE: Kathleen [name] MRN: [id number] DOB: 04-26-1946 Date of Office Visit: 01/29/2021  Referring provider: [name], MD Primary care provider: [name], MD  Chief Complaint: drug allergy  History of present illness: Kathleen [name] is a 75 y.o. female presenting today for consultation for drug allergy to penicillin.  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   She presents today with her son.  She states she has a penicillin allergy.  She takes a heart medication (tikosyn) and states when she had pneumonia in January she could not take her typical antibiotic of levaquin due to this heart medication as she states they interact with each other.  Because of this she had to use other antibiotics and states she went through 5 different antibiotics to help treat this pneumonia.  She has had complications with this pneumonia and required stay in rehab.  She is on supplemental oxygen and states she normally only requires about 2 L but since the pneumonia she is still up to 4L with her oxygen.  She believes if she were able to take a penicillin antibiotic that this would of help prevent some of the complications she had with this recent pneumonia.  Thus her presentation to my allergy clinic today.  She states she had hives as a child with penicillin.  She believes she did have penicillin medications prior to the tim she developed hives without issue.  She has been avoiding penicillin based medication since childhood.    Review of systems: Review of Systems  Constitutional: Negative.   HENT: Negative.    Eyes: Negative.   Respiratory:  Positive for cough and sputum production.   Cardiovascular: Negative.   Gastrointestinal: Negative.   Musculoskeletal: Negative.   Skin: Negative.   Neurological: Negative.    All other systems  negative unless noted above in HPI  Past medical history: 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HCC)    CHF (congestive heart failure) (HCC)    Chronic renal insufficiency    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    COPD (chronic obstructive pulmonary disease) (HCC)    Gout    Hypertension    Longstanding persistent atrial fibrillation (HCC)    Nonischemic cardiomyopathy (HCC)    Obesity    Pacemaker    Spontaneous pneumothorax 2013    Past surgical history: Past Surgical History:  Procedure Laterality Date   ABDOMINAL HYSTERECTOMY     ATRIAL FIBRILLATION ABLATION  07/20/2013   by Dr Christin Fudge   AV nodal ablation  11/01/2013   by Dr Christin Fudge, repeated by Dr Wilford Grist   BREAST BIOPSY Bilateral 1997   negative   CARDIAC CATHETERIZATION     CHOLECYSTECTOMY     HERNIA REPAIR     PACEMAKER INSERTION  06/2017   MDT Viva CRT-P implanted by Dr Christin Fudge after AV nodal ablation,  LV lead could not be placed    Family history:  Family History  Problem Relation Age of Onset   Breast cancer Mother 10       3 different times   Breast cancer Cousin     Social history:  Tobacco Use   Smoking status: Former    Types: Cigarettes    Quit date: 06/09/1987    Years  since quitting: 33.6   Smokeless tobacco: Never  Vaping Use   Vaping Use: Never used    Medication List: Current Outpatient Medications  Medication Sig Dispense Refill   albuterol (PROVENTIL HFA;VENTOLIN HFA) 108 (90 Base) MCG/ACT inhaler Inhale 2 puffs into the lungs every 4 (four) hours as needed for wheezing or shortness of breath.     amphetamine-dextroamphetamine (ADDERALL) 10 MG tablet Take 1 tablet (10 mg total) by mouth 2 (two) times daily with a meal. 180 tablet 0   azelastine (ASTELIN) 0.1 % nasal spray Place 2 sprays into both nostrils 2 (two) times daily.     benzonatate (TESSALON) 200 MG capsule  Take 1 capsule (200 mg total) by mouth 3 (three) times daily as needed for cough. 30 capsule 1   Cholecalciferol (VITAMIN D3) 50 MCG (2000 UT) TABS Take 2,000 Units by mouth daily.     cyanocobalamin (,VITAMIN B-12,) 1000 MCG/ML injection Inject 1,000 mcg into the muscle every 30 (thirty) days.     docusate sodium (COLACE) 100 MG capsule Take 100 mg by mouth daily.      dofetilide (TIKOSYN) 250 MCG capsule Take 250 mcg by mouth in the morning and at bedtime.     ENTRESTO 24-26 MG Take 1 tablet by mouth 2 (two) times daily.     EPINEPHrine 0.3 mg/0.3 mL IJ SOAJ injection Inject 0.3 mLs into the muscle as needed for anaphylaxis.     ezetimibe (ZETIA) 10 MG tablet Take 10 mg by mouth at bedtime.     febuxostat (ULORIC) 40 MG tablet Take 40 mg by mouth at bedtime.     ipratropium-albuterol (DUONEB) 0.5-2.5 (3) MG/3ML SOLN Inhale 3 mLs into the lungs in the morning, at noon, in the evening, and at bedtime.     levothyroxine (SYNTHROID) 50 MCG tablet Take 50 mcg by mouth daily before breakfast.  2   Magnesium 500 MG TABS Take 500 mg by mouth every evening.     polyethylene glycol (MIRALAX / GLYCOLAX) 17 g packet Take 17 g by mouth daily as needed for moderate constipation. 30 each 0   PREDNISONE PO Take 7.5 mg by mouth daily.     rivaroxaban (XARELTO) 20 MG TABS tablet Take 20 mg by mouth daily with supper.      sodium chloride HYPERTONIC 3 % nebulizer solution Take 4 mLs by nebulization 2 (two) times daily.     SPIRIVA RESPIMAT 2.5 MCG/ACT AERS Inhale 2 puffs into the lungs daily.  11   spironolactone (ALDACTONE) 25 MG tablet Take 25 mg by mouth daily.     torsemide (DEMADEX) 20 MG tablet Take 20 mg by mouth 2 (two) times a week.     fluticasone-salmeterol (ADVAIR HFA) 230-21 MCG/ACT inhaler Inhale 2 puffs into the lungs in the morning and at bedtime.     pantoprazole (PROTONIX) 40 MG tablet Take 1 tablet (40 mg total) by mouth daily. (Patient not taking: Reported on 01/22/2021) 30 tablet 0    simethicone (MYLICON) 80 MG chewable tablet Chew 1 tablet (80 mg total) by mouth 4 (four) times daily. (Patient not taking: Reported on 01/22/2021) 30 tablet 0   No current facility-administered medications for this visit.    Known medication allergies: Allergies  Allergen Reactions   Allopurinol Other (See Comments)    Reaction:  Dizziness    Clindamycin Anaphylaxis and Hives   Pneumococcal 13-Val Conj Vacc Itching, Swelling and Rash   Dronedarone Rash   Entresto [Sacubitril-Valsartan] Other (See Comments)  hypotension   Fosamax [Alendronate Sodium] Nausea Only   Meperidine Nausea And Vomiting   Penicillins Hives and Other (See Comments)    Has patient had a PCN reaction causing immediate rash, facial/tongue/throat swelling, SOB or lightheadedness with hypotension: No Has patient had a PCN reaction causing severe rash involving mucus membranes or skin necrosis: No Has patient had a PCN reaction that required hospitalization No Has patient had a PCN reaction occurring within the last 10 years: No If all of the above answers are "NO", then may proceed with Cephalosporin use.   Rosuvastatin Other (See Comments)    Reaction:  Muscle spasms    Tetracycline Hives   Adhesive [Tape] Rash   Lovastatin Rash    Other reaction(s): Muscle Pain     Physical examination: Blood pressure 110/60, pulse 86, temperature 97.6 F (36.4 C), temperature source Temporal, resp. rate 20, height 5\' 2"  (1.575 m), weight 218 lb 9.6 oz (99.2 kg), SpO2 92 %.  General: Alert, interactive, in no acute distress, nasal canula in place. HEENT: PERRLA, post-pharynx non erythematous. Neck: Supple without lymphadenopathy. Lungs: Clear to auscultation without wheezing, rhonchi or rales. {no increased work of breathing. CV: Normal S1, S2 without murmurs. Abdomen: Nondistended, nontender. Skin: Warm and dry, without lesions or rashes. Extremities:  No clubbing, cyanosis or edema. Neuro:   Grossly  intact.  Diagnositics/Labs:  Penicillin testing:  Skin prick testing is positive to histamine and negative to Pre-Pen, PCNG 5000u/ml and saline control Intradermal testing is negative to saline control, Prepen, PCNG 50 u/ml, PCNG 5000u/ml Allergy testing results were read and interpreted by provider, documented by clinical staff.   Assessment and plan: Adverse effect of medication -penicillin testing today is negative -advised to proceed with graded oral challenge to penicillin which has been scheduled today -remember to stay off all antihistamines for 3 days prior to oral challenge  Follow-up for graded oral challenge to penicillin  I appreciate the opportunity to take part in Kathleen [name]'s care. Please do not hesitate to contact me with questions.  Sincerely,   , MD Allergy/Immunology [facility]

## 2021-01-30 ENCOUNTER — Encounter: Payer: Self-pay | Admitting: Allergy

## 2021-01-30 NOTE — Patient Instructions (Signed)
-  penicillin testing today is negative -advised to proceed with graded oral challenge to penicillin which has been scheduled today -remember to stay off all antihistamines for 3 days prior to oral challenge  Follow-up for graded oral challenge to penicillin

## 2021-01-31 NOTE — Telephone Encounter (Signed)
Dr. Francine Graven please advise on patient mychart message  I show on the after visit summary that you stated for me to continue Breztri inhaler 2 puffs twice daily. I take Spiriva Respimat 2 puffs in am and Advair HFA 2 puffs twice daily.  Would you rather I take Breztri? Would it help me breathe better? I am having a hard time breathing!!! I am still on 4 liters 24/7. If you decide to order Markus Daft, please use [facility] at [address] and [address].    Instructions   Return in about 3 months (around 04/04/2021). We will check inflammatory labs today and schedule you for a high resolution CT chest scan for further evalution.    Continue current dose of prednisone 7.5mg  daily   Continue Breztri inhaler 2 puffs twice daily   Continue nebulizer treatments as needed.

## 2021-02-03 ENCOUNTER — Ambulatory Visit (INDEPENDENT_AMBULATORY_CARE_PROVIDER_SITE_OTHER): Payer: Medicare Other | Admitting: Family Medicine

## 2021-02-03 ENCOUNTER — Other Ambulatory Visit: Payer: Self-pay | Admitting: Family Medicine

## 2021-02-03 ENCOUNTER — Encounter: Payer: Self-pay | Admitting: Family Medicine

## 2021-02-03 ENCOUNTER — Other Ambulatory Visit: Payer: Self-pay

## 2021-02-03 VITALS — BP 114/60 | HR 84 | Temp 98.0°F | Resp 16

## 2021-02-03 DIAGNOSIS — T50905D Adverse effect of unspecified drugs, medicaments and biological substances, subsequent encounter: Secondary | ICD-10-CM | POA: Diagnosis not present

## 2021-02-03 DIAGNOSIS — T886XXD Anaphylactic reaction due to adverse effect of correct drug or medicament properly administered, subsequent encounter: Secondary | ICD-10-CM

## 2021-02-03 DIAGNOSIS — J449 Chronic obstructive pulmonary disease, unspecified: Secondary | ICD-10-CM

## 2021-02-03 DIAGNOSIS — J398 Other specified diseases of upper respiratory tract: Secondary | ICD-10-CM

## 2021-02-03 NOTE — Telephone Encounter (Signed)
Called and spoke with the patient for bipap titration. I asked her if she felt the Advair and Spriva were working for her. She stated that she can tell a difference with the Spiriva but no so much with the Advair. She said she has never been on the [place] before. She is willing to try the Breztri if JD feels it would be ok for her to try.   JD, please advise if you are ok with switching to [facility]. Thanks!

## 2021-02-03 NOTE — Patient Instructions (Addendum)
In office penicillin challenge Kathleen [name] was able to tolerate the penicillin challenge today at the office without adverse signs or symptoms of an allergic reaction. Therefore, she has the same risk of systemic reaction associated with the use of penicillin as the general population.  - Do not give any penicillin  for the next 24 hours. - Monitor for allergic symptoms such as rash, wheezing, diarrhea, swelling, and vomiting for the next 24 hours. If severe symptoms occur, treat with EpiPen injection and call 911. For less severe symptoms treat with Benadryl 50 mg every 4 hours and call the clinic.   Call the clinic if this treatment plan is not working well for you  Follow up as needed

## 2021-02-03 NOTE — Progress Notes (Signed)
[address] [place] [address] Dept: [phone]  FOLLOW UP NOTE  Patient ID: Kathleen [name], female    DOB: 11-Dec-1945  Age: 75 y.o. MRN: [id number] Date of Office Visit: 02/03/2021  Assessment  Chief Complaint: Food/Drug Challenge (Penicillin )  HPI Kathleen [name] is a 75 year old female who presents the clinic for follow-up visit with an office oral penicillin challenge.  She was last seen in this clinic on 01/29/2021 by Dr. Delorse Lek for evaluation of penicillin allergy with skin testing.  Significant related medical history includes history of tobacco use, tracheomalacia, continuous oxygen between 2 and 4 L via nasal cannula, Boop requiring frequent antibiotics and congestive heart failure requiring Tikosyn.  She is accompanied by her son who assists with history.  At today's visit, she reports that she is feeling well and has not taken any antihistamines over the last 3 days.  She continues oxygen 3 L via nasal cannula.  Other than slight cough which has been longstanding, she denies cardiopulmonary, gastrointestinal, and integumentary symptoms before beginning penicillin oral challenge.  Her current medications are listed in the chart.   Drug Allergies:  Allergies  Allergen Reactions   Allopurinol Other (See Comments)    Reaction:  Dizziness    Clindamycin Anaphylaxis and Hives   Pneumococcal 13-Val Conj Vacc Itching, Swelling and Rash   Dronedarone Rash   Entresto [Sacubitril-Valsartan] Other (See Comments)    hypotension   Fosamax [Alendronate Sodium] Nausea Only   Meperidine Nausea And Vomiting   Penicillins Hives and Other (See Comments)    Has patient had a PCN reaction causing immediate rash, facial/tongue/throat swelling, SOB or lightheadedness with hypotension: No Has patient had a PCN reaction causing severe rash involving mucus membranes or skin necrosis: No Has patient had a PCN reaction that required hospitalization No Has patient had a PCN  reaction occurring within the last 10 years: No If all of the above answers are "NO", then may proceed with Cephalosporin use.   Rosuvastatin Other (See Comments)    Reaction:  Muscle spasms    Tetracycline Hives   Adhesive [Tape] Rash   Lovastatin Rash    Other reaction(s): Muscle Pain    Physical Exam: BP 114/60 (BP Location: Left Arm, Patient Position: Sitting, Cuff Size: Large)   Pulse 84   Temp 98 F (36.7 C) (Temporal)   Resp 16   SpO2 96%    Physical Exam Vitals reviewed.  Constitutional:      Appearance: Normal appearance.  HENT:     Head: Normocephalic and atraumatic.     Right Ear: Tympanic membrane normal.     Left Ear: Tympanic membrane normal.     Nose:     Comments: Bilateral nares slightly erythematous with clear nasal drainage noted.  Pharynx normal.  Ears normal.  Eyes normal.    Mouth/Throat:     Pharynx: Oropharynx is clear.  Eyes:     Conjunctiva/sclera: Conjunctivae normal.  Cardiovascular:     Rate and Rhythm: Normal rate and regular rhythm.     Heart sounds: Normal heart sounds. No murmur heard. Pulmonary:     Effort: Pulmonary effort is normal.     Breath sounds: Normal breath sounds.     Comments: Lungs clear to auscultation Musculoskeletal:        General: Normal range of motion.     Cervical back: Normal range of motion and neck supple.  Skin:    General: Skin is warm and dry.  Neurological:  Mental Status: She is alert and oriented to person, place, and time.  Psychiatric:        Mood and Affect: Mood normal.        Behavior: Behavior normal.        Thought Content: Thought content normal.        Judgment: Judgment normal.    Diagnostics: FVC 1.61, FEV1 0.95.  Predicted FVC 2.53, predicted FEV1 1.96.  Spirometry indicates obstruction and restriction.  Open graded penicillin oral challenge: The patient was able to tolerate the challenge today without adverse signs or symptoms. Vital signs were stable throughout the challenge and  observation period. She received 3 doses separated by 30 minutes, each of which was separated by vitals and a brief physical exam. She received the following doses:0.1 ml, 1 ml, and 9 ml of penicillin 250 mg/5 ml for a total of 500 mg. She was monitored for 60 minutes following the last dose.   The patient had negative skin prick tests to penicillin and pre pen  and was able to tolerate the open graded oral challenge today without adverse signs or symptoms. Therefore, she has the same risk of systemic reaction associated with  penicillin  as the general population.   Assessment and Plan: 1. Anaphylactic shock, due to adverse effect of correct medicinal substance properly administered, subsequent encounter   2. Asthma-COPD overlap syndrome [facility])     Patient Instructions  In office penicillin challenge Kathleen [name] was able to tolerate the penicillin challenge today at the office without adverse signs or symptoms of an allergic reaction. Therefore, she has the same risk of systemic reaction associated with the use of penicillin as the general population.  - Do not give any penicillin  for the next 24 hours. - Monitor for allergic symptoms such as rash, wheezing, diarrhea, swelling, and vomiting for the next 24 hours. If severe symptoms occur, treat with EpiPen injection and call 911. For less severe symptoms treat with Benadryl 50 mg every 4 hours and call the clinic.   Call the clinic if this treatment plan is not working well for you  Follow up as needed    Return if symptoms worsen or fail to improve.    Thank you for the opportunity to care for this patient.  Please do not hesitate to contact me with questions.  Thermon Leyland, FNP [facility]

## 2021-02-07 ENCOUNTER — Telehealth: Payer: Self-pay | Admitting: Pulmonary Disease

## 2021-02-07 NOTE — Telephone Encounter (Signed)
Spoke with the pt  She is calling bc h not heard from [facility] about BIPAP  Called [facility] and spoke with Aurther Loft  Was advised that they are going to reach out to the pt today

## 2021-02-11 ENCOUNTER — Telehealth: Payer: Self-pay | Admitting: Pulmonary Disease

## 2021-02-11 NOTE — Telephone Encounter (Signed)
ATC, NA and no option to leave msg, will call back

## 2021-02-11 NOTE — Telephone Encounter (Signed)
Pt states [facility] came over and changed ventilator setting from 300 to 350 with possible increase to 400. 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 Please advise [phone]

## 2021-02-12 NOTE — Telephone Encounter (Signed)
Pt returning missed call [phone]

## 2021-02-13 ENCOUNTER — Encounter (HOSPITAL_COMMUNITY): Payer: Self-pay | Admitting: Internal Medicine

## 2021-02-13 ENCOUNTER — Other Ambulatory Visit: Payer: Self-pay

## 2021-02-13 ENCOUNTER — Ambulatory Visit (HOSPITAL_COMMUNITY)
Admission: RE | Admit: 2021-02-13 | Discharge: 2021-02-13 | Disposition: A | Discharge status: Home / Self Care | Payer: Medicare Other | Source: Ambulatory Visit | Attending: Internal Medicine | Admitting: Internal Medicine

## 2021-02-13 ENCOUNTER — Other Ambulatory Visit (HOSPITAL_COMMUNITY): Payer: Self-pay

## 2021-02-13 VITALS — BP 90/50 | HR 84 | Wt 221.6 lb

## 2021-02-13 DIAGNOSIS — Z888 Allergy status to other drugs, medicaments and biological substances status: Secondary | ICD-10-CM | POA: Diagnosis not present

## 2021-02-13 DIAGNOSIS — J8489 Other specified interstitial pulmonary diseases: Secondary | ICD-10-CM | POA: Diagnosis not present

## 2021-02-13 DIAGNOSIS — Z6841 Body Mass Index (BMI) 40.0 and over, adult: Secondary | ICD-10-CM | POA: Diagnosis not present

## 2021-02-13 DIAGNOSIS — G4733 Obstructive sleep apnea (adult) (pediatric): Secondary | ICD-10-CM | POA: Insufficient documentation

## 2021-02-13 DIAGNOSIS — Z7952 Long term (current) use of systemic steroids: Secondary | ICD-10-CM | POA: Insufficient documentation

## 2021-02-13 DIAGNOSIS — R531 Weakness: Secondary | ICD-10-CM | POA: Diagnosis not present

## 2021-02-13 DIAGNOSIS — N189 Chronic kidney disease, unspecified: Secondary | ICD-10-CM | POA: Diagnosis not present

## 2021-02-13 DIAGNOSIS — Z7951 Long term (current) use of inhaled steroids: Secondary | ICD-10-CM | POA: Insufficient documentation

## 2021-02-13 DIAGNOSIS — J9611 Chronic respiratory failure with hypoxia: Secondary | ICD-10-CM | POA: Diagnosis not present

## 2021-02-13 DIAGNOSIS — I130 Hypertensive heart and chronic kidney disease with heart failure and stage 1 through stage 4 chronic kidney disease, or unspecified chronic kidney disease: Secondary | ICD-10-CM | POA: Insufficient documentation

## 2021-02-13 DIAGNOSIS — Z87891 Personal history of nicotine dependence: Secondary | ICD-10-CM | POA: Diagnosis not present

## 2021-02-13 DIAGNOSIS — I5032 Chronic diastolic (congestive) heart failure: Secondary | ICD-10-CM | POA: Diagnosis not present

## 2021-02-13 DIAGNOSIS — I4819 Other persistent atrial fibrillation: Secondary | ICD-10-CM | POA: Diagnosis not present

## 2021-02-13 DIAGNOSIS — I5022 Chronic systolic (congestive) heart failure: Secondary | ICD-10-CM | POA: Diagnosis not present

## 2021-02-13 DIAGNOSIS — Z7901 Long term (current) use of anticoagulants: Secondary | ICD-10-CM | POA: Insufficient documentation

## 2021-02-13 DIAGNOSIS — Z79899 Other long term (current) drug therapy: Secondary | ICD-10-CM | POA: Diagnosis not present

## 2021-02-13 DIAGNOSIS — R42 Dizziness and giddiness: Secondary | ICD-10-CM | POA: Insufficient documentation

## 2021-02-13 DIAGNOSIS — Z9981 Dependence on supplemental oxygen: Secondary | ICD-10-CM | POA: Diagnosis not present

## 2021-02-13 LAB — BASIC METABOLIC PANEL
Anion gap: 11 (ref 5–15)
BUN: 26 mg/dL — ABNORMAL HIGH (ref 8–23)
CO2: 26 mmol/L (ref 22–32)
Calcium: 9.7 mg/dL (ref 8.9–10.3)
Chloride: 100 mmol/L (ref 98–111)
Creatinine, Ser: 0.88 mg/dL (ref 0.44–1.00)
GFR, Estimated: >60 mL/min (ref >60)
Glucose, Bld: 188 mg/dL — ABNORMAL HIGH (ref 70–99)
Potassium: 4.4 mmol/L (ref 3.5–5.1)
Sodium: 137 mmol/L (ref 135–145)

## 2021-02-13 LAB — BRAIN NATRIURETIC PEPTIDE: B Natriuretic Peptide: 28.4 pg/mL (ref 0.0–100.0)

## 2021-02-13 MED ORDER — LOSARTAN POTASSIUM 25 MG PO TABS: 25.0000 mg | ORAL_TABLET | Freq: Every day | ORAL | 6 refills | Status: DC

## 2021-02-13 MED ORDER — TORSEMIDE 20 MG PO TABS: 20.0000 mg | ORAL_TABLET | ORAL | 6 refills | Status: DC

## 2021-02-13 NOTE — Telephone Encounter (Signed)
Spoke with the pt  She states that [facility] came to her home 02/11/21 and increased VENT settings from 350 to 300  We ordered BIPAP but but stated she has never been on a BIPAP  She also was advised that she needs [place] to prescribe VEST therapy and change all her inhalers to nebs  They suggested that she take perforomist, budesonide and yupelri bc this would help her more than powder inh She states she is currently back on the symbicort and taking spiriva, duoneb and saline nebs  She ran out of the advair, still had symbicort and feels this helped her more  She says her breathing seems to be slowly getting worse every day  She is scheduled with Dr Francine Graven 03/27/21 and her appt with [facility] got moved out further than planned (? Date)  I called [facility] to clarify everything- her NIV was started in 2018 per Dr Virgina Jock in [place], and he gave orders to titrate tidal volume range from 300-400. They check her vent every month and this is why they came out to her home.

## 2021-02-13 NOTE — Progress Notes (Addendum)
ADVANCED HF CLINIC CONSULT NOTE  Referring Physician: Dr. Jeralyn Ruths  Primary Care: [name], MD Primary Cardiologist: Dr. Jeralyn Ruths ([facility])  HPI:  Ms. Steinmiller is a 75 y/o woman with morbid obesity, OSA on CPAP, BOOP with tracheobronchomalacia, diastolic HF, persistent AF s/p AVN ablation and pacemaker implant 2017 (has failed upgrade to BiV). Recently moved from [place] to [place] and referred by Dr. Jeralyn Ruths to establish cardiology care.   Previous echo with EF 40% but EF recovered. Echo ([facility]) 8/22 EF > 55% RV normal   She has been followed by Dr. Wilford Grist and Jeralyn Ruths at [facility]. Has maintained NSR on Tikosyn.   Now followed by Dr. Francine Graven in Pulmonary for her BOOP.    She is here with her son (who works in Consulting civil engineer at [employer]). Was in hospital in 6/22 for PNA. Came out of [facility] in July and was on torsemide 3x/week. Saw Dr. Jeralyn Ruths last month and felt she was dry and torsemide cut back to 2x/week (Sunday and Thursday). Weight typically 216-219. Recently up to 221-226. Today was 219. She has Cardiomems in place but not using lately. Says she is always SOB but felt it was worse today. Was on Entresto for awhile and it was stopped due to low BP. Restarted 8 or 9 months ago. BP usually 100-115 but lately 90-100s. Feels dizzy and weak. Failed Jardiance with frequent UTIs. Walks with [employer] to go to cafeteria. Has OT/PT. No CP.   Review of Systems: [y] = yes, [ ]  = no   General: Weight gain [ y]; Weight loss [ ] ; Anorexia [ ] ; Fatigue [ y]; Fever [ ] ; Chills [ ] ; Weakness [ ]   Cardiac: Chest pain/pressure [ ] ; Resting SOB [ y]; Exertional SOB [ y]; Orthopnea [ ] ; Pedal Edema [ y]; Palpitations [ ] ; Syncope [ ] ; Presyncope [ ] ; Paroxysmal nocturnal dyspnea[ ]   Pulmonary: Cough [ y]; Wheezing[ ] ; Hemoptysis[ ] ; Sputum [ ] ; Snoring [ y]  GI: Vomiting[ ] ; Dysphagia[ ] ; Melena[ ] ; Hematochezia [ ] ; Heartburn[ ] ; Abdominal pain [ ] ; Constipation [ ] ; Diarrhea [ ] ; BRBPR [ ]   GU: Hematuria[ ] ;  Dysuria [ ] ; Nocturia[ ]   Vascular: Pain in legs with walking [ ] ; Pain in feet with lying flat [ ] ; Non-healing sores [ ] ; Stroke [ ] ; TIA [ ] ; Slurred speech [ ] ;  Neuro: Headaches[ ] ; Vertigo[ ] ; Seizures[ ] ; Paresthesias[ ] ;Blurred vision [ ] ; Diplopia [ ] ; Vision changes [ ]   Ortho/Skin: Arthritis [ y]; Joint pain ]; Muscle pain [ ] ; Joint swelling [ ] ; Back Pain [ ] ; Rash [ ]   Psych: Depression[ ] ; Anxiety[ ]   Heme: Bleeding problems [ ] ; Clotting disorders [ ] ; Anemia [ ]   Endocrine: Diabetes [ ] ; Thyroid dysfunction[ ]    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HCC)    CHF (congestive heart failure) (HCC)    Chronic renal insufficiency    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     azelastine (ASTELIN) 0.1 % nasal spray Place 2 sprays into both nostrils 2 (two) times daily.     Cholecalciferol (VITAMIN D3) 50 MCG (2000 UT) TABS Take 2,000 Units by mouth daily.     cyanocobalamin (,VITAMIN B-12,) 1000 MCG/ML injection Inject 1,000 mcg into the muscle every 30 (thirty) days.     docusate sodium (COLACE) 100 MG capsule Take 100 mg by mouth 2 (two) times daily.     dofetilide (TIKOSYN) 250 MCG capsule Take 250 mcg by mouth in the morning and at bedtime.     ENTRESTO 24-26 MG Take 1 tablet by mouth 2 (two) times daily.     ezetimibe (ZETIA) 10 MG tablet Take 10 mg by mouth at bedtime.     febuxostat (ULORIC) 40 MG  tablet Take 40 mg by mouth at bedtime.     ipratropium-albuterol (DUONEB) 0.5-2.5 (3) MG/3ML SOLN Inhale 3 mLs into the lungs in the morning, at noon, in the evening, and at bedtime.     levothyroxine (SYNTHROID) 50 MCG tablet Take 50 mcg by mouth daily before breakfast.  2   Magnesium 500 MG TABS Take 500 mg by mouth every evening.     polyethylene glycol (MIRALAX / GLYCOLAX) 17 g packet Take 17 g by mouth daily as needed for moderate constipation. 30 each 0   PREDNISONE PO Take 7.5 mg by mouth daily.     rivaroxaban (XARELTO) 20 MG TABS tablet Take 20 mg by mouth daily with supper.      sodium chloride HYPERTONIC 3 % nebulizer solution Take 4 mLs by nebulization 2 (two) times daily.     SPIRIVA RESPIMAT 2.5 MCG/ACT AERS Inhale 2 puffs into the lungs daily.  11   spironolactone (ALDACTONE) 25 MG tablet Take 25 mg by mouth daily.     torsemide (DEMADEX) 20 MG tablet Take 20 mg by mouth 2 (two) times a week.     fluticasone-salmeterol (ADVAIR HFA) 230-21 MCG/ACT inhaler Inhale 2 puffs into the lungs in the morning and at bedtime.     No current facility-administered medications for this encounter.    Allergies  Allergen Reactions   Allopurinol Other (See Comments)    Reaction:  Dizziness    Clindamycin Anaphylaxis and Hives   Pneumococcal 13-Val Conj Vacc Itching, Swelling and Rash   Dronedarone Rash   Entresto [Sacubitril-Valsartan] Other (See Comments)    hypotension   Fosamax [Alendronate Sodium] Nausea Only   Meperidine Nausea And Vomiting   Rosuvastatin Other (See Comments)    Reaction:  Muscle spasms    Tetracycline Hives   Adhesive [Tape] Rash   Lovastatin Rash    Other reaction(s): Muscle Pain      Social History   Socioeconomic History   Marital status: Widowed    Spouse name: Not on file   Number of children: Not on file   Years of education: Not on file   Highest education level: Not on file  Occupational History   Occupation: retired  Tobacco Use   Smoking  status: Former    Types: Cigarettes    Quit date: 06/09/1987    Years since quitting: 33.7   Smokeless tobacco: Never  Vaping Use   Vaping Use: Never used  Substance and Sexual Activity   Alcohol use: No    Alcohol/week: 0.0 standard drinks   Drug use: No   Sexual activity: Not Currently  Other Topics Concern   Not on file  Social History Narrative   Pt lives in [place] alone.  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  Her son works in [employer].   Social Determinants of Health   Financial Resource Strain: Not on file  Food Insecurity: Not on file  Transportation Needs: Not on file  Physical Activity: Not on file  Stress: Not on file  Social Connections: Not on file  Intimate Partner Violence: Not on file      Family History  Problem Relation Age of Onset   Breast cancer Mother 50       3 different times   Breast cancer Cousin     Vitals:   02/13/21 1448  BP: (!) 90/50  Pulse: 84  SpO2: 95%  Weight: 100.5 kg (221 lb 9.6 oz)    PHYSICAL EXAM: General:  Obese women on nasal cannula O2 HEENT: normal Neck: supple. no JVD. Carotids 2+ bilat; no bruits. No lymphadenopathy or thryomegaly appreciated. Cor: PMI nondisplaced. Regular rate & rhythm. No rubs, gallops or murmurs. Lungs: coarse Abdomen: obese soft, nontender, nondistended. No hepatosplenomegaly. No bruits or masses. Good bowel sounds. Extremities: no cyanosis, clubbing, rash, edema Neuro: alert & oriented x 3, cranial nerves grossly intact. moves all 4 extremities w/o difficulty. Affect pleasant.  ECG: NSR pacing 89 QTc 472 Personally reviewed   ASSESSMENT & PLAN:   1. Chronic diastolic HF - Echo 8/22 EF 55%. RV ok  - Chronic NYHA III  - Volume status ok. Continue Torsemide 20 mg 2x/week. Take extra tablet if weight >= 222 - Stop Entresto 24/26 bid due to low BP. Switch to losartan - Continue Cleda Daub 25 daily - Failed Jardiance due to frequent UTIs - Will arrange to get her set up on our  Cardiomems system  2. 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 Francine Graven)  3. Persistent AF - s/p AVN ablation and PM (failed upgrade to BiV) - maintaining NSR on Tikosyn - Continue Xarelto  4. Morbid obesity  - Body mass index is 40.53 kg/m.  5. OSA - on Bipap - Continue BIPAP   Arvilla Meres, MD  2:58 PM

## 2021-02-13 NOTE — Progress Notes (Signed)
Medication Samples have been provided to the patient.  Drug name: Xarelto       Strength: 20 mg        Qty: 4  LOT: [id number]  Exp.Date: 04/2023  Dosing instructions: Take 1 tablet daily  The patient has been instructed regarding the correct time, dose, and frequency of taking this medication, including desired effects and most common side effects.   Smitty Cords Ariann Khaimov 4:10 PM 02/13/2021

## 2021-02-13 NOTE — Patient Instructions (Signed)
Stop [employer] Losartan 25 mg Daily AT BEDTIME  Continue Torsemide 20 mg twice a week, take an extra tab as needed for weight of 222 lbs or greater  Labs done today, your results will be available in MyChart, we will contact you for abnormal readings.  We will get you enrolled into our Cardiomems program  Please call our office in December to schedule your follow up for January 2023  Do the following things EVERYDAY: Weigh yourself in the morning before breakfast. Write it down and keep it in a log. Take your medicines as prescribed Eat low salt foods--Limit salt (sodium) to 2000 mg per day.  Stay as active as you can everyday Limit all fluids for the day to less than 2 liters  Weigh yourself EVERY morning after you go to the bathroom but before you eat or drink anything. Write this number down in a weight log/diary. If you gain 3 pounds overnight or 5 pounds in a week, call the heart failure clinic  Limit your fluid intake to less than 2 liters of fluid per day. Fluid includes all drinks, coffee, juice, ice chips, soup, jello, and all other liquids.  Restrict your sodium intake to less than 2000mg  per day. This will help prevent your body from holding onto fluid. Read food labels as a lot of canned and packaged foods have a lot of sodium.  If you have any questions or concerns before your next appointment please send a message through [place] or call our office at [phone].    TO LEAVE A MESSAGE FOR THE NURSE SELECT OPTION 2, PLEASE LEAVE A MESSAGE INCLUDING: YOUR NAME DATE OF BIRTH CALL BACK NUMBER REASON FOR CALL**this is important as we prioritize the call backs  YOU WILL RECEIVE A CALL BACK THE SAME DAY AS LONG AS YOU CALL BEFORE 4:00 PM  At the [facility], you and your health needs are our priority. As part of our continuing mission to provide you with exceptional heart care, we have created designated Provider Care Teams. These Care Teams  include your primary Cardiologist (physician) and Advanced Practice Providers (APPs- Physician Assistants and Nurse Practitioners) who all work together to provide you with the care you need, when you need it.   You may see any of the following providers on your designated Care Team at your next follow up: Dr [phone] Dr Arvilla Meres Dr Marca Ancona, NP Brandon Melnick, Robbie Lis [place] Mikki Santee, PharmD   Please be sure to bring in all your medications bottles to every appointment.

## 2021-02-20 ENCOUNTER — Encounter: Payer: Self-pay | Admitting: Internal Medicine

## 2021-02-21 ENCOUNTER — Telehealth: Payer: Self-pay

## 2021-02-21 ENCOUNTER — Other Ambulatory Visit: Payer: Self-pay | Admitting: Internal Medicine

## 2021-02-21 DIAGNOSIS — B356 Tinea cruris: Secondary | ICD-10-CM

## 2021-02-21 MED ORDER — KETOCONAZOLE 2 % EX CREA: 1.0000 "application " | TOPICAL_CREAM | Freq: Two times a day (BID) | CUTANEOUS | 2 refills | Status: DC

## 2021-02-21 NOTE — Telephone Encounter (Signed)
Percussion vest bronchiectasis "Insurance is needing this added to last OV"    Ashly [phone]

## 2021-02-24 NOTE — Assessment & Plan Note (Signed)
Percussion vest bronchiectasis

## 2021-02-24 NOTE — Telephone Encounter (Signed)
Called Ashly ([employer]) LVM to discuss.

## 2021-02-24 NOTE — Telephone Encounter (Signed)
Ashly calling back to speak w/ nurse regarding patient.  Says he was going to be in the area today & he just wanted to discuss a few things  Call back [phone]

## 2021-03-03 ENCOUNTER — Encounter: Payer: Self-pay | Admitting: Internal Medicine

## 2021-03-04 ENCOUNTER — Other Ambulatory Visit: Payer: Self-pay | Admitting: Internal Medicine

## 2021-03-04 DIAGNOSIS — J410 Simple chronic bronchitis: Secondary | ICD-10-CM

## 2021-03-04 MED ORDER — PREDNISONE 5 MG PO TABS: 5.0000 mg | ORAL_TABLET | Freq: Every day | ORAL | 0 refills | Status: DC

## 2021-03-04 NOTE — Telephone Encounter (Signed)
Received the following message from patient:   "I did see Dr Barb Merino at [facility] on Sept 21.  He said I am not a candidate for the stent procedure  done at [facility]. Nor am I a candidate for the non-invasive procedure done at [place].  He believes if I did have either operation, I would probably not live through them and if I did, I would be in a nursing home the rest of my life. He showed me the CT of my airway.  It shows the food tube wide open, but the airway tube is a small slither.    I gave recommendations from [facility] to one of your nurses.  I think these must be from the sleep study numbers.  They have brought a vest for me to use to help with clearing my lungs.  It is very heavy.  I have asked the heart doctor if it would bother my pacemaker and lead bundle.  They also are suggesting using 3 drugs in a nebulizer twice a day. What do you think of their suggestions? If you like [employer]'s suggestions, then they want me off of the inhalers-stating I cant get the medicine down enough in my lungs. Please let me know what you want me to do!!"  I looked to see if we had ever ordered a vest for her and did not see the order. [employer] suggested the vest for her and somehow was able to authorize it without a prescription/order. They have also suggested nebulizer treatments for her.   JD, can you please advise? Thanks!

## 2021-03-07 ENCOUNTER — Telehealth: Payer: Self-pay | Admitting: Pulmonary Disease

## 2021-03-07 MED ORDER — BUDESONIDE 0.5 MG/2ML IN SUSP: 0.5000 mg | Freq: Two times a day (BID) | RESPIRATORY_TRACT | 11 refills | Status: DC

## 2021-03-07 MED ORDER — ARFORMOTEROL TARTRATE 15 MCG/2ML IN NEBU: 15.0000 ug | INHALATION_SOLUTION | Freq: Two times a day (BID) | RESPIRATORY_TRACT | 11 refills | Status: DC

## 2021-03-07 MED ORDER — YUPELRI 175 MCG/3ML IN SOLN: 175.0000 ug | Freq: Every day | RESPIRATORY_TRACT | 0 refills | Status: DC

## 2021-03-07 NOTE — Telephone Encounter (Signed)
Yes, ok to order and discontinue her trelegy. I will update the last office note.  Thanks, Cletis [place]

## 2021-03-07 NOTE — Telephone Encounter (Signed)
Call made to [facility] with [employer]. Their therapist was going out monthly to check her vent. He reports she is proactive in her treatment. Stating basically when she wants something she wants something. She voiced to the therapist she does not feel like the trelegy is effective for her SOB. Therapist suggested she switched all her inhaler medications to nebulizer medications.   Per Morrie Sheldon these meds will need to be added to last OV note in order to be covered by insurance and at no cost to patient. Must states in note patient is switching her inhalers to nebulizer medications.   Therapist suggested Brovana BID , Budesonide BID , and Yupelri daily 21ml.  If they receive the order they will be able    JD please advise.

## 2021-03-07 NOTE — Telephone Encounter (Signed)
Call made to patient, confirmed DOB. Made aware of JD recommendations. Aware script has been sent to [facility].   Call returned to [place] with Patsy Lager made aware note has been updated and scripts have been sent. Nothing further needed at this time.

## 2021-03-13 NOTE — Telephone Encounter (Signed)
This has been addressed. See phone notes. Inhalers have been D/C and switched to nebulizer medications.   Nothing further needed at this time.

## 2021-03-14 ENCOUNTER — Other Ambulatory Visit (HOSPITAL_COMMUNITY): Payer: Self-pay

## 2021-03-27 ENCOUNTER — Ambulatory Visit (INDEPENDENT_AMBULATORY_CARE_PROVIDER_SITE_OTHER): Payer: Medicare Other | Admitting: Pulmonary Disease

## 2021-03-27 ENCOUNTER — Other Ambulatory Visit: Payer: Self-pay

## 2021-03-27 ENCOUNTER — Telehealth (HOSPITAL_COMMUNITY): Payer: Self-pay | Admitting: Adult Health

## 2021-03-27 ENCOUNTER — Encounter: Payer: Self-pay | Admitting: Pulmonary Disease

## 2021-03-27 VITALS — BP 122/68 | HR 89 | Ht 62.0 in | Wt 222.0 lb

## 2021-03-27 DIAGNOSIS — J449 Chronic obstructive pulmonary disease, unspecified: Secondary | ICD-10-CM | POA: Diagnosis not present

## 2021-03-27 DIAGNOSIS — J398 Other specified diseases of upper respiratory tract: Secondary | ICD-10-CM

## 2021-03-27 NOTE — Patient Instructions (Signed)
Let's try and hold the budesonide nebulizer treatment and monitor for any improvement in your joint aches.   Continue brovana and yupelri nebulizer treatments  Continue bipap therapy when sleeping.

## 2021-03-27 NOTE — Telephone Encounter (Signed)
   Cardiomems Goal 17   Todays reading 21   Please call and ask her to take an extra 20 mg torsemide today.  Hudsyn Champine NP-C  3:49 PM

## 2021-03-27 NOTE — Progress Notes (Signed)
Synopsis: Referred in June 2022 for COPD by Aram Beecham, MD  Subjective:   PATIENT ID: Kathleen [name] GENDER: female DOB: Jun 27, 1945, MRN: [id number]  HPI  Chief Complaint  Patient presents with   Follow-up    3 mo f/u. Has switched over to [place], Pulmicort and Yupelri. Concerned about the Pulmicort causing joint pain.    Kathleen [name] is a 75 year old woman, former smoker with asthma-COPD overlap syndrome, tracheobronchomalacia, BOOP in 2013 and chronic hypoxemic respiratory failure who returns to pulmonary clinic for follow up.   She has been doing better since last visit with the transition from inhaler therapy to ICS/LABA/LAMA nebulizer therapy. She is concerned that the budesonide nebulizer is leading to diffuse joint aches of her shoulders, wrists, hips, knees, ankles and feet.   She was evaluated by Dr. Barb Merino at [facility] and is not a candidate for stent placement for her tracheobronchomalacia.   She has been taking torsemide for fluctuations in her weight due to her heart failure which has alleviated some of her dyspnea.   She is accompanied by her son today.   01/02/21 Since last visit she continues to complain of cough, shortness of breath and wheezing. She was treated with prolonged prednisone taper. She reports her breathing was somewhat better but once she started getting to the lower doses of prednisone her symptoms returned. We also stopped the 3% hypertonic saline nebs due to airway irritation.   OV 12/10/20 She was previously followed at [facility] and has recently moved to [place].   She was hospitalized at [facility] 11/14/20 to 11/19/20 for treatment of pneumonia and respiratory failure. She was treated with cefuroxime and azithromycin and then with a 5 day course of IM ceftriaxone that was completed last Monday at her rehab center. She was discharged to a skilled nursing facility.   She reports her cough has worsened over the past 3  days, similar to when she was admitted with pneumonia. Prior to the last 3 days she thought her cough was improving. She has minimal sputum production at this time. It is a dry, barking cough. She has been using advair 230-71mcg 2 puffs twice daily and spiriva daily along with albuterol and 3% saline nebulizer treatments 3 times per day. She is also taking 7.5mg  of prednisone daily. She reports being on prednisone over the past 3 years and they have been trying to wean her down.  Office notes from [facility] pulmonology on 11/12/20 reviewed.   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HCC)    CHF (congestive heart failure) (HCC)    Chronic renal insufficiency    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    COPD (chronic obstructive pulmonary disease) (HCC)    Gout    Hypertension    Longstanding persistent atrial fibrillation (HCC)    Nonischemic cardiomyopathy (HCC)    Obesity    Pacemaker    Spontaneous pneumothorax 2013     Family History  Problem Relation Age of Onset   Breast cancer Mother 62       3 different times   Breast cancer Cousin      Social History   Socioeconomic History   Marital status: Widowed    Spouse name: Not on file   Number of children: Not on file   Years of education: Not on file   Highest  education level: Not on file  Occupational History   Occupation: retired  Tobacco Use   Smoking status: Former    Types: Cigarettes    Quit date: 06/09/1987    Years since quitting: 33.8   Smokeless tobacco: Never  Vaping Use   Vaping Use: Never used  Substance and Sexual Activity   Alcohol use: No    Alcohol/week: 0.0 standard drinks   Drug use: No   Sexual activity: Not Currently  Other Topics Concern   Not on file  Social History Narrative   Pt lives in [place] alone.  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  Her son works in [employer].    Social Determinants of Health   Financial Resource Strain: Not on file  Food Insecurity: Not on file  Transportation Needs: Not on file  Physical Activity: Not on file  Stress: Not on file  Social Connections: Not on file  Intimate Partner Violence: Not on file     Allergies  Allergen Reactions   Allopurinol Other (See Comments)    Reaction:  Dizziness    Clindamycin Anaphylaxis and Hives   Pneumococcal 13-Val Conj Vacc Itching, Swelling and Rash   Dronedarone Rash   Entresto [Sacubitril-Valsartan] Other (See Comments)    hypotension   Fosamax [Alendronate Sodium] Nausea Only   Meperidine Nausea And Vomiting   Rosuvastatin Other (See Comments)    Reaction:  Muscle spasms    Tetracycline Hives   Adhesive [Tape] Rash   Lovastatin Rash    Other reaction(s): Muscle Pain     Outpatient Medications Prior to Visit  Medication Sig Dispense Refill   albuterol (PROVENTIL HFA;VENTOLIN HFA) 108 (90 Base) MCG/ACT inhaler Inhale 2 puffs into the lungs every 4 (four) hours as needed for wheezing or shortness of breath.     arformoterol (BROVANA) 15 MCG/2ML NEBU Take 2 mLs (15 mcg total) by nebulization 2 (two) times daily. 120 mL 11   azelastine (ASTELIN) 0.1 % nasal spray Place 2 sprays into both nostrils 2 (two) times daily.     budesonide (PULMICORT) 0.5 MG/2ML nebulizer solution Take 2 mLs (0.5 mg total) by nebulization 2 (two) times daily. 120 mL 11   Cholecalciferol (VITAMIN D3) 50 MCG (2000 UT) TABS Take 2,000 Units by mouth daily.     cyanocobalamin (,VITAMIN B-12,) 1000 MCG/ML injection Inject 1,000 mcg into the muscle every 30 (thirty) days.     docusate sodium (COLACE) 100 MG capsule Take 100 mg by mouth 2 (two) times daily.     dofetilide (TIKOSYN) 250 MCG capsule Take 250 mcg by mouth in the morning and at bedtime.     ezetimibe (ZETIA) 10 MG tablet Take 10 mg by mouth at bedtime.     febuxostat (ULORIC) 40 MG tablet Take 40 mg by mouth at bedtime.      ipratropium-albuterol (DUONEB) 0.5-2.5 (3) MG/3ML SOLN Inhale 3 mLs into the lungs in the morning, at noon, in the evening, and at bedtime.     ketoconazole (NIZORAL) 2 % cream Apply 1 application topically 2 (two) times daily. 60 g 2   levothyroxine (SYNTHROID) 50 MCG tablet Take 50 mcg by mouth daily before breakfast.  2   losartan (COZAAR) 25 MG tablet Take 1 tablet (25 mg total) by mouth at bedtime. 30 tablet 6   Magnesium 500 MG TABS Take 500 mg by mouth every evening.     polyethylene glycol (MIRALAX / GLYCOLAX) 17 g packet Take 17 g by mouth  daily as needed for moderate constipation. 30 each 0   predniSONE (DELTASONE) 5 MG tablet Take 1 tablet (5 mg total) by mouth daily with breakfast. 30 tablet 0   revefenacin (YUPELRI) 175 MCG/3ML nebulizer solution Take 3 mLs (175 mcg total) by nebulization daily. 90 mL 0   rivaroxaban (XARELTO) 20 MG TABS tablet Take 20 mg by mouth daily with supper.      spironolactone (ALDACTONE) 25 MG tablet Take 25 mg by mouth daily.     torsemide (DEMADEX) 20 MG tablet Take 1 tablet (20 mg total) by mouth 2 (two) times a week. Take extra tab for weight 222 lbs or greater 20 tablet 6   sodium chloride HYPERTONIC 3 % nebulizer solution Take 4 mLs by nebulization 2 (two) times daily.     SPIRIVA RESPIMAT 2.5 MCG/ACT AERS Inhale 2 puffs into the lungs daily.  11   No facility-administered medications prior to visit.   Review of Systems  Constitutional:  Negative for chills, fever, malaise/fatigue and weight loss.  HENT:  Negative for congestion, sinus pain and sore throat.   Eyes: Negative.   Respiratory:  Positive for cough, shortness of breath and wheezing. Negative for hemoptysis and sputum production.   Cardiovascular:  Positive for orthopnea and leg swelling. Negative for chest pain, palpitations and claudication.  Gastrointestinal:  Negative for abdominal pain, heartburn, nausea and vomiting.  Genitourinary: Negative.   Musculoskeletal:  Negative for joint  pain and myalgias.  Skin:  Negative for rash.  Neurological:  Negative for weakness.  Endo/Heme/Allergies: Negative.   Psychiatric/Behavioral: Negative.     Objective:   Vitals:   03/27/21 1359  BP: 122/68  Pulse: 89  SpO2: 96%  Weight: 222 lb (100.7 kg)  Height: 5\' 2"  (1.575 m)   Physical Exam Constitutional:      General: She is not in acute distress.    Appearance: She is obese. She is not ill-appearing.  HENT:     Head: Normocephalic and atraumatic.  Eyes:     General: No scleral icterus.    Conjunctiva/sclera: Conjunctivae normal.     Pupils: Pupils are equal, round, and reactive to light.  Cardiovascular:     Rate and Rhythm: Normal rate and regular rhythm.     Pulses: Normal pulses.     Heart sounds: Normal heart sounds. No murmur heard. Pulmonary:     Effort: Pulmonary effort is normal.     Breath sounds: No wheezing, rhonchi or rales.  Abdominal:     General: Bowel sounds are normal.     Palpations: Abdomen is soft.  Musculoskeletal:     Right lower leg: No edema.     Left lower leg: No edema.  Lymphadenopathy:     Cervical: No cervical adenopathy.  Skin:    General: Skin is warm and dry.  Neurological:     General: No focal deficit present.     Mental Status: She is alert.  Psychiatric:        Mood and Affect: Mood normal.        Behavior: Behavior normal.        Thought Content: Thought content normal.        Judgment: Judgment normal.    CBC    Component Value Date/Time   WBC 11.0 (H) 11/18/2020 0505   RBC 4.51 11/18/2020 0505   HGB 12.6 11/18/2020 0505   HGB 13.4 12/28/2012 0748   HCT 39.6 11/18/2020 0505   HCT 39.2 12/28/2012 0748   PLT  173 11/18/2020 0505   PLT 186 12/28/2012 0748   MCV 87.8 11/18/2020 0505   MCV 87 12/28/2012 0748   MCH 27.9 11/18/2020 0505   MCHC 31.8 11/18/2020 0505   RDW 14.7 11/18/2020 0505   RDW 15.0 (H) 12/28/2012 0748   LYMPHSABS 1.4 11/18/2020 0505   LYMPHSABS 0.4 (L) 12/28/2012 0748   MONOABS 0.8  11/18/2020 0505   MONOABS 0.5 12/28/2012 0748   EOSABS 0.2 11/18/2020 0505   EOSABS 0.0 12/28/2012 0748   BASOSABS 0.1 11/18/2020 0505   BASOSABS 0.0 12/28/2012 0748   BMP Latest Ref Rng & Units 02/13/2021 11/18/2020 11/17/2020  Glucose 70 - 99 mg/dL 409(B) 353(G) 992(E)  BUN 8 - 23 mg/dL 26(S) 18 14  Creatinine 0.44 - 1.00 mg/dL 3.41 9.62 2.29  Sodium 135 - 145 mmol/L 137 138 138  Potassium 3.5 - 5.1 mmol/L 4.4 4.0 4.0  Chloride 98 - 111 mmol/L 100 103 105  CO2 22 - 32 mmol/L 26 27 24   Calcium 8.9 - 10.3 mg/dL 9.7 9.7 9.4   Chest imaging: HRCT Chest 01/07/21 1. Tracheobronchomalacia, with collapse of the trachea on expiratory phase imaging. 2. 03/09/21 appearing, bandlike scarring and or atelectasis of the bilateral lung bases. No evidence of fibrotic interstitial lung disease. 3. Previously noted new nodule of the posterior right middle lobe is resolved. 4. New atelectasis or consolidation of the posterior lingula. Scattered ground-glass opacity in the left upper lobe. Findings are nonspecific and infectious or inflammatory. 5. Emphysema. 6. Coronary artery disease.  PFT: No flowsheet data found. Pulmonary Function Test (PFT) Latest Ref Rng & Units 03/29/2013 11/27/2014 11/25/2016 11/10/2017 12/07/2019 01/23/2020 05/28/2020  FVC PRE L 2.08 2.1 2 2.02 2.01 1.80 1.94  FVC % PRE PRED % 77 78.6 76.2 80.3 - - -  FEV1 PRE L 1.29 1.38 1.33 1.15 1.25 1.03 0.99  FEV1 % PRE PRED % 61 66.2 65.5 59.4 - - -  FEV1/FVC PRE % 62 65.62 66.48 57.12 62.28 57.21 51.18  TLC PRE L 3.48 - - 3.85 - - -  TLC % PRE PRED % 74 - - 83.4 - - -  RV PRE L 1.38 - - 1.83 - - -  RV % PRE PRED % 69 - - 89.7 - - -  DLCO PRE ml/(min*mmHg) 7.7 - - 8.37 - - -  DLCO % PRE PRED % 44 - - 49.6 - - -  FEF25-75% PRE L/s 0.49 0.72 0.72 0.43 0.54 0.41 0.35  FEF25-75% % PRE PRED % 24 35.5 37.5 23.2 - - -   6 minute walk test (01/23/2020): The patient's resting oxygen saturation is 96% with a HR of 80. With exertion her lowest  oxygen saturation was 92% on room air. Her peak HR was 103. She walked a total of 2m which was 25% predicted.    Assessment & Plan:   Tracheobronchomalacia  Asthma-COPD overlap syndrome (HCC)  Discussion: Kathleen [name] is a 75 year old woman, former smoker with asthma-COPD overlap syndrome, tracheobronchomalacia, BOOP in 2013 and chronic hypoxemic respiratory failure who returns to pulmonary clinic for follow up.  She is doing better on nebulizer regimen compared to inhaler therapy. We will hold her budesonide nebulizer treatments as she feels this is contributing to diffuse joint aches. If we need to resume ICS, then we can resume budesonide at reduced dose of 0.25mg .   She is to continue bipap therapy and supplemental oxygen at this time.   There are no treatment options for her tracheobronchomalacia. She  will continue to experience intermittent cough episodes and wheezing due to this. Her lungs are clear on exam today.   Follow up in 6 months.  Melody Comas, MD [facility] Office: [phone]   Current Outpatient Medications:    albuterol (PROVENTIL HFA;VENTOLIN HFA) 108 (90 Base) MCG/ACT inhaler, Inhale 2 puffs into the lungs every 4 (four) hours as needed for wheezing or shortness of breath., Disp: , Rfl:    arformoterol (BROVANA) 15 MCG/2ML NEBU, Take 2 mLs (15 mcg total) by nebulization 2 (two) times daily., Disp: 120 mL, Rfl: 11   azelastine (ASTELIN) 0.1 % nasal spray, Place 2 sprays into both nostrils 2 (two) times daily., Disp: , Rfl:    budesonide (PULMICORT) 0.5 MG/2ML nebulizer solution, Take 2 mLs (0.5 mg total) by nebulization 2 (two) times daily., Disp: 120 mL, Rfl: 11   Cholecalciferol (VITAMIN D3) 50 MCG (2000 UT) TABS, Take 2,000 Units by mouth daily., Disp: , Rfl:    cyanocobalamin (,VITAMIN B-12,) 1000 MCG/ML injection, Inject 1,000 mcg into the muscle every 30 (thirty) days., Disp: , Rfl:    docusate sodium (COLACE) 100 MG capsule, Take  100 mg by mouth 2 (two) times daily., Disp: , Rfl:    dofetilide (TIKOSYN) 250 MCG capsule, Take 250 mcg by mouth in the morning and at bedtime., Disp: , Rfl:    ezetimibe (ZETIA) 10 MG tablet, Take 10 mg by mouth at bedtime., Disp: , Rfl:    febuxostat (ULORIC) 40 MG tablet, Take 40 mg by mouth at bedtime., Disp: , Rfl:    ipratropium-albuterol (DUONEB) 0.5-2.5 (3) MG/3ML SOLN, Inhale 3 mLs into the lungs in the morning, at noon, in the evening, and at bedtime., Disp: , Rfl:    ketoconazole (NIZORAL) 2 % cream, Apply 1 application topically 2 (two) times daily., Disp: 60 g, Rfl: 2   levothyroxine (SYNTHROID) 50 MCG tablet, Take 50 mcg by mouth daily before breakfast., Disp: , Rfl: 2   losartan (COZAAR) 25 MG tablet, Take 1 tablet (25 mg total) by mouth at bedtime., Disp: 30 tablet, Rfl: 6   Magnesium 500 MG TABS, Take 500 mg by mouth every evening., Disp: , Rfl:    polyethylene glycol (MIRALAX / GLYCOLAX) 17 g packet, Take 17 g by mouth daily as needed for moderate constipation., Disp: 30 each, Rfl: 0   predniSONE (DELTASONE) 5 MG tablet, Take 1 tablet (5 mg total) by mouth daily with breakfast., Disp: 30 tablet, Rfl: 0   revefenacin (YUPELRI) 175 MCG/3ML nebulizer solution, Take 3 mLs (175 mcg total) by nebulization daily., Disp: 90 mL, Rfl: 0   rivaroxaban (XARELTO) 20 MG TABS tablet, Take 20 mg by mouth daily with supper. , Disp: , Rfl:    spironolactone (ALDACTONE) 25 MG tablet, Take 25 mg by mouth daily., Disp: , Rfl:    torsemide (DEMADEX) 20 MG tablet, Take 1 tablet (20 mg total) by mouth 2 (two) times a week. Take extra tab for weight 222 lbs or greater, Disp: 20 tablet, Rfl: 6

## 2021-03-28 ENCOUNTER — Encounter: Payer: Self-pay | Admitting: Pulmonary Disease

## 2021-03-28 NOTE — Telephone Encounter (Signed)
Pt aware.

## 2021-03-31 ENCOUNTER — Encounter: Payer: Self-pay | Admitting: Internal Medicine

## 2021-03-31 NOTE — Telephone Encounter (Signed)
Received the following message from patient:   "I did stop the Budesonide and within 2 days the terrible pain in my joints had stopped.  I am not breathing as well though and I have been coughing up phlegm more. It seems to be thicker. Do you want to order a medicine like Budesonide that will help the breathing and not cause joint pain or should I chance using it once a day and hope not to have the pain?"  JD, can you please advise? Thanks!

## 2021-04-01 ENCOUNTER — Other Ambulatory Visit: Payer: Self-pay | Admitting: Pulmonary Disease

## 2021-04-01 ENCOUNTER — Other Ambulatory Visit: Payer: Self-pay | Admitting: Internal Medicine

## 2021-04-01 DIAGNOSIS — J410 Simple chronic bronchitis: Secondary | ICD-10-CM

## 2021-04-01 DIAGNOSIS — D510 Vitamin B12 deficiency anemia due to intrinsic factor deficiency: Secondary | ICD-10-CM

## 2021-04-01 MED ORDER — CYANOCOBALAMIN 1000 MCG/ML IJ SOLN: 1000.0000 ug | INTRAMUSCULAR | 0 refills | Status: DC

## 2021-04-01 MED ORDER — PREDNISONE 5 MG PO TABS: 2.5000 mg | ORAL_TABLET | Freq: Every day | ORAL | 0 refills | Status: AC

## 2021-04-02 ENCOUNTER — Other Ambulatory Visit: Payer: Self-pay | Admitting: Internal Medicine

## 2021-04-04 MED ORDER — BUDESONIDE 0.25 MG/2ML IN SUSP: 0.2500 mg | Freq: Two times a day (BID) | RESPIRATORY_TRACT | 12 refills | Status: DC

## 2021-04-10 ENCOUNTER — Telehealth: Payer: Self-pay

## 2021-04-10 ENCOUNTER — Telehealth (HOSPITAL_COMMUNITY): Payer: Self-pay | Admitting: Adult Health

## 2021-04-10 NOTE — Telephone Encounter (Signed)
(  3:31 pm) SW spoke with patient to scheduled a visit with patient. RN/SW scheduled a visit for 04/17/21.

## 2021-04-10 NOTE — Telephone Encounter (Signed)
  Cardiomems Goal 17  Reading 21  I left message to take extra 20 mg torsemide Doyce Saling NP-C  3:11 PM

## 2021-04-14 ENCOUNTER — Emergency Department (HOSPITAL_COMMUNITY): Payer: Medicare Other

## 2021-04-14 ENCOUNTER — Emergency Department (HOSPITAL_COMMUNITY)
Admission: EM | Admit: 2021-04-14 | Discharge: 2021-04-15 | Disposition: A | Discharge status: Home / Self Care | Payer: Medicare Other | Attending: Emergency Medicine | Admitting: Emergency Medicine

## 2021-04-14 DIAGNOSIS — Z7901 Long term (current) use of anticoagulants: Secondary | ICD-10-CM | POA: Diagnosis not present

## 2021-04-14 DIAGNOSIS — I5022 Chronic systolic (congestive) heart failure: Secondary | ICD-10-CM | POA: Insufficient documentation

## 2021-04-14 DIAGNOSIS — R55 Syncope and collapse: Secondary | ICD-10-CM | POA: Insufficient documentation

## 2021-04-14 DIAGNOSIS — R509 Fever, unspecified: Secondary | ICD-10-CM | POA: Diagnosis present

## 2021-04-14 DIAGNOSIS — I130 Hypertensive heart and chronic kidney disease with heart failure and stage 1 through stage 4 chronic kidney disease, or unspecified chronic kidney disease: Secondary | ICD-10-CM | POA: Insufficient documentation

## 2021-04-14 DIAGNOSIS — I4891 Unspecified atrial fibrillation: Secondary | ICD-10-CM | POA: Insufficient documentation

## 2021-04-14 DIAGNOSIS — J181 Lobar pneumonia, unspecified organism: Secondary | ICD-10-CM | POA: Diagnosis not present

## 2021-04-14 DIAGNOSIS — J449 Chronic obstructive pulmonary disease, unspecified: Secondary | ICD-10-CM | POA: Diagnosis not present

## 2021-04-14 DIAGNOSIS — N189 Chronic kidney disease, unspecified: Secondary | ICD-10-CM | POA: Insufficient documentation

## 2021-04-14 DIAGNOSIS — J45909 Unspecified asthma, uncomplicated: Secondary | ICD-10-CM | POA: Diagnosis not present

## 2021-04-14 DIAGNOSIS — Z20822 Contact with and (suspected) exposure to covid-19: Secondary | ICD-10-CM | POA: Diagnosis not present

## 2021-04-14 DIAGNOSIS — R109 Unspecified abdominal pain: Secondary | ICD-10-CM | POA: Diagnosis not present

## 2021-04-14 DIAGNOSIS — J189 Pneumonia, unspecified organism: Secondary | ICD-10-CM

## 2021-04-14 DIAGNOSIS — Z79899 Other long term (current) drug therapy: Secondary | ICD-10-CM | POA: Diagnosis not present

## 2021-04-14 DIAGNOSIS — Z950 Presence of cardiac pacemaker: Secondary | ICD-10-CM | POA: Diagnosis not present

## 2021-04-14 DIAGNOSIS — Z87891 Personal history of nicotine dependence: Secondary | ICD-10-CM | POA: Diagnosis not present

## 2021-04-14 LAB — CBC
HCT: 39.9 % (ref 36.0–46.0)
Hemoglobin: 12.7 g/dL (ref 12.0–15.0)
MCH: 26.8 pg (ref 26.0–34.0)
MCHC: 31.8 g/dL (ref 30.0–36.0)
MCV: 84.4 fL (ref 80.0–100.0)
Platelets: 217 10*3/uL (ref 150–400)
RBC: 4.73 MIL/uL (ref 3.87–5.11)
RDW: 14.8 % (ref 11.5–15.5)
WBC: 16.5 10*3/uL — ABNORMAL HIGH (ref 4.0–10.5)
nRBC: 0 % (ref 0.0–0.2)

## 2021-04-14 LAB — TSH: TSH: 1.502 u[IU]/mL (ref 0.350–4.500)

## 2021-04-14 LAB — COMPREHENSIVE METABOLIC PANEL
ALT: 11 U/L (ref 0–44)
AST: 12 U/L — ABNORMAL LOW (ref 15–41)
Albumin: 3.9 g/dL (ref 3.5–5.0)
Alkaline Phosphatase: 60 U/L (ref 38–126)
Anion gap: 11 (ref 5–15)
BUN: 13 mg/dL (ref 8–23)
CO2: 25 mmol/L (ref 22–32)
Calcium: 8.9 mg/dL (ref 8.9–10.3)
Chloride: 98 mmol/L (ref 98–111)
Creatinine, Ser: 0.72 mg/dL (ref 0.44–1.00)
GFR, Estimated: >60 mL/min (ref >60)
Glucose, Bld: 157 mg/dL — ABNORMAL HIGH (ref 70–99)
Potassium: 3.7 mmol/L (ref 3.5–5.1)
Sodium: 134 mmol/L — ABNORMAL LOW (ref 135–145)
Total Bilirubin: 1.2 mg/dL (ref 0.3–1.2)
Total Protein: 6.9 g/dL (ref 6.5–8.1)

## 2021-04-14 LAB — RESP PANEL BY RT-PCR (FLU A&B, COVID) ARPGX2
Influenza A by PCR: NEGATIVE
Influenza B by PCR: NEGATIVE
SARS Coronavirus 2 by RT PCR: NEGATIVE

## 2021-04-14 LAB — URINALYSIS, ROUTINE W REFLEX MICROSCOPIC
Bilirubin Urine: NEGATIVE
Glucose, UA: NEGATIVE mg/dL
Hgb urine dipstick: NEGATIVE
Ketones, ur: NEGATIVE mg/dL
Nitrite: NEGATIVE
Protein, ur: NEGATIVE mg/dL
Specific Gravity, Urine: 1.018 (ref 1.005–1.030)
pH: 5 (ref 5.0–8.0)

## 2021-04-14 LAB — LIPASE, BLOOD: Lipase: 24 U/L (ref 11–51)

## 2021-04-14 LAB — MAGNESIUM: Magnesium: 2 mg/dL (ref 1.7–2.4)

## 2021-04-14 MED ORDER — IPRATROPIUM BROMIDE 0.02 % IN SOLN
0.5000 mg | Freq: Once | RESPIRATORY_TRACT | Status: DC
Start: 2021-04-14 — End: 2021-04-15

## 2021-04-14 MED ORDER — ALBUTEROL SULFATE (2.5 MG/3ML) 0.083% IN NEBU: 5.0000 mg | INHALATION_SOLUTION | Freq: Once | RESPIRATORY_TRACT | Status: DC

## 2021-04-14 NOTE — ED Provider Notes (Signed)
Emergency Medicine Provider Triage Evaluation Note  DNIYAH GRANT , a 75 y.o. female  was evaluated in triage.  Pt complains of myalgias, fever, cough, nausea and now lower abdominal pain.  Review of Systems  Positive: Fever, cough, anorexia, nausea and 1 episode of syncope today Negative: Diarrhea, dysuria  Physical Exam  BP 128/68 (BP Location: Left Arm)   Pulse 78   Temp 98.3 F (36.8 C) (Oral)   Resp 16   SpO2 98% Comment: Simultaneous filing. User may not have seen previous data. Gen:   Awake, no distress   Resp:  Normal effort, wheezing bilaterally and decreased breath sounds in the bases.  On 4 L of oxygen MSK:   Moves extremities without difficulty no swelling Other:  Pain with palpation in the pelvis area of the abdomen bilaterally  Medical Decision Making  Medically screening exam initiated at 10:29 AM.  Appropriate orders placed.  REGLA FITZGIBBON was informed that the remainder of the evaluation will be completed by another provider, this initial triage assessment does not replace that evaluation, and the importance of remaining in the ED until their evaluation is complete.     Gwyneth Sprout, MD 04/14/21 1031

## 2021-04-14 NOTE — ED Triage Notes (Signed)
Ems brings pt in from home for abdominal pain and nausea for 2 days. Pt reports dizziness and syncope this morning. Denies any falls.

## 2021-04-15 ENCOUNTER — Emergency Department (HOSPITAL_COMMUNITY): Payer: Medicare Other

## 2021-04-15 DIAGNOSIS — J181 Lobar pneumonia, unspecified organism: Secondary | ICD-10-CM | POA: Diagnosis not present

## 2021-04-15 LAB — TROPONIN I (HIGH SENSITIVITY): Troponin I (High Sensitivity): 4 ng/L (ref <18)

## 2021-04-15 MED ORDER — AZITHROMYCIN 250 MG PO TABS: 250.0000 mg | ORAL_TABLET | Freq: Every day | ORAL | 0 refills | Status: AC

## 2021-04-15 MED ORDER — ONDANSETRON HCL 4 MG PO TABS: 4.0000 mg | ORAL_TABLET | Freq: Three times a day (TID) | ORAL | 0 refills | Status: DC | PRN

## 2021-04-15 MED ORDER — AMOXICILLIN-POT CLAVULANATE 875-125 MG PO TABS
1.0000 | ORAL_TABLET | Freq: Once | ORAL | Status: AC
  Administered 2021-04-15: 1 via ORAL
  Filled 2021-04-15: qty 1

## 2021-04-15 MED ORDER — ONDANSETRON 4 MG PO TBDP
4.0000 mg | ORAL_TABLET | Freq: Once | ORAL | Status: AC
  Administered 2021-04-15: 4 mg via ORAL
  Filled 2021-04-15: qty 1

## 2021-04-15 MED ORDER — AZITHROMYCIN 250 MG PO TABS
500.0000 mg | ORAL_TABLET | Freq: Every day | ORAL | Status: DC
  Administered 2021-04-15: 500 mg via ORAL
  Filled 2021-04-15: qty 2

## 2021-04-15 MED ORDER — AMOXICILLIN-POT CLAVULANATE 875-125 MG PO TABS: 1.0000 | ORAL_TABLET | Freq: Two times a day (BID) | ORAL | 0 refills | Status: AC

## 2021-04-15 NOTE — ED Provider Notes (Signed)
[facility] DEPT Provider Note   CSN: [id number] Arrival date & time: 04/14/21  0947     History Chief Complaint  Patient presents with   Abdominal Pain    Kathleen [name] is a 75 y.o. female.  HPI  Patient with significant medical history of asthma, A. fib currently on Eliquis, CHF, COPD, hypertension, chronic respiratory failure currently on 4 L via nasal cannula presents with chief complaint of URI-like symptoms.  Patient states symptoms started on Saturday, she endorses fevers, chills, nasal congestion, slight cough, stomach pain, nausea, retching, general body aches.  She states that she has not had much of an appetite, but is able to tolerate p.o. if needed.  Some pain is in her epigastric region, does not radiate, is a dull-like sensation, worsened with cough and retching.  SHe denies any sort of chest pain, pleuritic chest pain, worsening peripheral edema, she has had no increase in her O2 requirements.  She does note that earlier today she was talking to her son and she passed out.  States that she was talking to him while she was sitting on her bed and then woke up with a phone next to her.  She is unsure what of happened, she denies biting her tongue, become incontinent, she states she felt fine after the incident.  She does note that she is felt slightly dizzy when she goes from sitting to standing position, but this improves while she is sitting or laying down.  Patient is vaccine against COVID but has not had her influenza vaccine, denies any recent sick contacts, has no other complaints this time.  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    COPD (chronic obstructive pulmonary disease) ([place])    Gout    Hypertension     Longstanding persistent atrial fibrillation ([place])    Nonischemic cardiomyopathy ([place])    Obesity    Pacemaker    Spontaneous pneumothorax 2013    Patient Active Problem List   Diagnosis Date Noted   Daytime somnolence 01/22/2021   Sleep apnea 01/22/2021   Constipation    (HFpEF) heart failure with preserved ejection fraction ([place]) [id number]   Chronic systolic heart failure ([place]) 04/06/2018   HTN (hypertension) 04/06/2018   COPD (chronic obstructive pulmonary disease) ([place]) 04/06/2018   Atrial fibrillation ([place]) 04/06/2018   Malnutrition of moderate degree 09/11/2015    Past Surgical History:  Procedure Laterality Date   ABDOMINAL HYSTERECTOMY     ATRIAL FIBRILLATION ABLATION  07/20/2013   by Dr Westley Gambles   AV nodal ablation  11/01/2013   by Dr Westley Gambles, repeated by Dr Dwana Curd   BREAST BIOPSY Bilateral 1997   negative   CARDIAC CATHETERIZATION     CHOLECYSTECTOMY     HERNIA REPAIR     PACEMAKER INSERTION  06/2017   MDT Viva CRT-P implanted by Dr Westley Gambles after AV nodal ablation,  LV lead could not be placed     OB History   No obstetric history on file.     Family History  Problem Relation Age of Onset   Breast cancer Mother 32       3 different times   Breast cancer Cousin     Social History   Tobacco  Use   Smoking status: Former    Types: Cigarettes    Quit date: 06/09/1987    Years since quitting: 33.8   Smokeless tobacco: Never  Vaping Use   Vaping Use: Never used  Substance Use Topics   Alcohol use: No    Alcohol/week: 0.0 standard drinks   Drug use: No    Home Medications Prior to Admission medications   Medication Sig Start Date End Date Taking? Authorizing Provider  albuterol (PROVENTIL HFA;VENTOLIN HFA) 108 (90 Base) MCG/ACT inhaler Inhale 2 puffs into the lungs every 4 (four) hours as needed for wheezing or shortness of breath.   Yes [provider]  amoxicillin-clavulanate (AUGMENTIN) 875-125 MG tablet Take 1 tablet by mouth every 12  (twelve) hours for 6 days. 04/15/21 04/21/21 Yes Carroll Sage, PA-C  arformoterol (BROVANA) 15 MCG/2ML NEBU Take 2 mLs (15 mcg total) by nebulization 2 (two) times daily. 03/07/21 06/14/21 Yes Martina Sinner, MD  azelastine (ASTELIN) 0.1 % nasal spray Place 2 sprays into both nostrils 2 (two) times daily. 09/06/20  Yes [provider]  azithromycin (ZITHROMAX) 250 MG tablet Take 1 tablet (250 mg total) by mouth daily for 4 days. Take 1 every day until finished. 04/15/21 04/19/21 Yes Carroll Sage, PA-C  budesonide (PULMICORT) 0.25 MG/2ML nebulizer solution Take 2 mLs (0.25 mg total) by nebulization 2 (two) times daily. Patient taking differently: Take 0.25 mg by nebulization 2 (two) times daily as needed (wheezing/shortness of breath). 04/04/21  Yes Martina Sinner, MD  Cholecalciferol (VITAMIN D3) 50 MCG (2000 UT) TABS Take 2,000 Units by mouth daily.   Yes [provider]  cyanocobalamin (,VITAMIN B-12,) 1000 MCG/ML injection Inject 1 mL (1,000 mcg total) into the muscle every 30 (thirty) days. 04/01/21  Yes [name], MD  docusate sodium (COLACE) 100 MG capsule Take 100 mg by mouth 2 (two) times daily as needed for mild constipation.   Yes [provider]  dofetilide (TIKOSYN) 250 MCG capsule Take 250 mcg by mouth in the morning and at bedtime. 08/17/20 08/17/21 Yes [provider]  ezetimibe (ZETIA) 10 MG tablet Take 10 mg by mouth at bedtime. 10/01/20  Yes [provider]  febuxostat (ULORIC) 40 MG tablet Take 40 mg by mouth at bedtime. 05/01/19  Yes [provider]  ipratropium-albuterol (DUONEB) 0.5-2.5 (3) MG/3ML SOLN Inhale 3 mLs into the lungs every 6 (six) hours as needed (wheezing/shortness of breath).   Yes [provider]  ketoconazole (NIZORAL) 2 % cream Apply 1 application topically 2 (two) times daily. 02/21/21  Yes [name], MD  levothyroxine (SYNTHROID) 50 MCG tablet Take 50 mcg by mouth daily before  breakfast.   Yes [provider]  losartan (COZAAR) 25 MG tablet Take 1 tablet (25 mg total) by mouth at bedtime. 02/13/21  Yes Bensimhon, [name], MD  Magnesium 500 MG TABS Take 500 mg by mouth every evening.   Yes [provider]  ondansetron (ZOFRAN) 4 MG tablet Take 1 tablet (4 mg total) by mouth every 8 (eight) hours as needed for nausea or vomiting. 04/15/21  Yes Carroll Sage, PA-C  predniSONE (DELTASONE) 5 MG tablet Take 0.5 tablets (2.5 mg total) by mouth daily with breakfast. 04/01/21 05/01/21 Yes [name], MD  rivaroxaban (XARELTO) 20 MG TABS tablet Take 20 mg by mouth daily with supper.    Yes [provider]  spironolactone (ALDACTONE) 25 MG tablet Take 25 mg by mouth daily. 11/03/20  Yes  [provider]  torsemide (DEMADEX) 20 MG tablet Take 1 tablet (20 mg total) by mouth 2 (two) times a week. Take extra tab for weight 222 lbs or greater Patient taking differently: Take 20 mg by mouth 2 (two) times a week. Take extra tab for weight 2 lbs or greater 02/13/21  Yes Bensimhon, Shaune Pascal, MD  traMADol (ULTRAM) 50 MG tablet Take 50 mg by mouth every 6 (six) hours as needed for moderate pain.   Yes [provider]  YUPELRI 175 MCG/3ML nebulizer solution USE 1 VIAL IN NEBULIZER DAILY - DO NOT MIX WITH OTHER NEB MEDS,USE BEFORE OR AFTER Patient taking differently: Take 175 mcg by nebulization daily. 04/02/21  Yes Freddi Starr, MD    Allergies    Allopurinol, Clindamycin, Pneumococcal 13-val conj vacc, Dronedarone, Entresto [sacubitril-valsartan], Fosamax [alendronate sodium], Meperidine, Rosuvastatin, Tetracycline, Adhesive [tape], and Lovastatin  Review of Systems   Review of Systems  Constitutional:  Positive for chills and fever.  HENT:  Positive for congestion.   Respiratory:  Positive for cough. Negative for shortness of breath.   Cardiovascular:  Negative for chest pain.  Gastrointestinal:  Positive for abdominal pain and  nausea. Negative for vomiting.  Genitourinary:  Negative for enuresis.  Musculoskeletal:  Positive for myalgias. Negative for back pain.  Skin:  Negative for rash.  Neurological:  Negative for dizziness and headaches.  Hematological:  Does not bruise/bleed easily.   Physical Exam Updated Vital Signs BP (!) 147/109   Pulse 74   Temp 98.3 F (36.8 C) (Oral)   Resp 20   SpO2 98%   Physical Exam Vitals and nursing note reviewed.  Constitutional:      General: She is not in acute distress.    Appearance: She is not ill-appearing.  HENT:     Head: Normocephalic and atraumatic.     Nose: No congestion.     Mouth/Throat:     Mouth: Mucous membranes are moist.  Eyes:     Conjunctiva/sclera: Conjunctivae normal.  Cardiovascular:     Rate and Rhythm: Normal rate and regular rhythm.     Pulses: Normal pulses.     Heart sounds: No murmur heard.   No friction rub. No gallop.  Pulmonary:     Effort: No respiratory distress.     Breath sounds: No wheezing, rhonchi or rales.     Comments: No signs of respiratory distress, nontachypneic, nonhypoxic, currently at her baseline O2 requirements of 4 L via nasal cannula. Abdominal:     Palpations: Abdomen is soft.     Tenderness: There is no abdominal tenderness. There is no right CVA tenderness or left CVA tenderness.  Musculoskeletal:     Comments: Patient is moving all 4 extremities.  Skin:    General: Skin is warm and dry.  Neurological:     Mental Status: She is alert.     Comments: No facial asymmetry, no difficult word finding, no slurring of words, able to follow two-step commands, no unilateral weakness present my exam.  Patient able to ambulate without difficulty.  Psychiatric:        Mood and Affect: Mood normal.    ED Results / Procedures / Treatments   Labs (all labs ordered are listed, but only abnormal results are displayed) Labs Reviewed  COMPREHENSIVE METABOLIC PANEL - Abnormal; Notable for the following components:       Result Value   Sodium 134 (*)    Glucose, Bld 157 (*)    AST 12 (*)  All other components within normal limits  CBC - Abnormal; Notable for the following components:   WBC 16.5 (*)    All other components within normal limits  URINALYSIS, ROUTINE W REFLEX MICROSCOPIC - Abnormal; Notable for the following components:   APPearance HAZY (*)    Leukocytes,Ua MODERATE (*)    Bacteria, UA RARE (*)    All other components within normal limits  RESP PANEL BY RT-PCR (FLU A&B, COVID) ARPGX2  LIPASE, BLOOD  MAGNESIUM  TSH  TROPONIN I (HIGH SENSITIVITY)    EKG EKG Interpretation  Date/Time:  Monday April 14 2021 09:56:54 EST Ventricular Rate:  71 PR Interval:  228 QRS Duration: 136 QT Interval:  409 QTC Calculation: 445 R Axis:   -63 Text Interpretation: Sinus rhythm Prolonged PR interval IVCD, consider atypical RBBB LVH with secondary repolarization abnormality Inferior infarct, old Anterior infarct, old Lateral leads are also involved Baseline wander in lead(s) V1 V3 V5 V6 No significant change since last tracing Confirmed by Blanchie Dessert [phone]) on 04/14/2021 10:39:32 AM  Radiology CT Head Wo Contrast  Result Date: 04/15/2021 CLINICAL DATA:  Dizziness, syncope, fall, leukocytosis EXAM: CT HEAD WITHOUT CONTRAST TECHNIQUE: Contiguous axial images were obtained from the base of the skull through the vertex without intravenous contrast. COMPARISON:  05/21/2017 FINDINGS: Brain: No evidence of acute infarction, hemorrhage, hydrocephalus, extra-axial collection or mass lesion/mass effect. Mild cortical atrophy. Vascular: No hyperdense vessel or unexpected calcification. Skull: Normal. Negative for fracture or focal lesion. Sinuses/Orbits: The visualized paranasal sinuses are essentially clear. The mastoid air cells are unopacified. Other: None. IMPRESSION: No evidence of acute intracranial abnormality. Mild cortical atrophy. Electronically Signed   By: Julian Hy M.D.   On:  04/15/2021 01:02   DG Chest Port 1 View  Result Date: 04/14/2021 CLINICAL DATA:  Shortness of breath, cough. Additional history provided: Abdominal pain and nausea for 2 days, dizziness and syncope this morning. History of COPD, CHF, hypertension, asthma, atrial fibrillation, pacemaker. EXAM: PORTABLE CHEST 1 VIEW COMPARISON:  CT chest 01/07/2021. Chest radiographs 12/10/2020 and earlier. FINDINGS: Redemonstrated left chest multi lead implantable cardiac device. Heart size at the upper limits of normal, unchanged. Aortic atherosclerosis. Streaky opacities within the right lung base compatible with atelectasis. Mild ill-defined opacity within the left lung base, which may reflect atelectasis, scarring or pneumonia. Trace left pleural effusion is questioned. No evidence of pneumothorax. No acute bony abnormality identified. IMPRESSION: Ill-defined opacity within the left lung base which may reflect atelectasis, scarring or pneumonia. A trace left pleural effusion is also questioned. Mild atelectasis within the right lung base. Aortic Atherosclerosis (ICD10-I70.0). Electronically Signed   By: Kellie Simmering D.O.   On: 04/14/2021 10:43    Procedures Procedures   Medications Ordered in ED Medications  albuterol (PROVENTIL) (2.5 MG/3ML) 0.083% nebulizer solution 5 mg (has no administration in time range)  ipratropium (ATROVENT) nebulizer solution 0.5 mg (has no administration in time range)  azithromycin (ZITHROMAX) tablet 500 mg (has no administration in time range)  amoxicillin-clavulanate (AUGMENTIN) 875-125 MG per tablet 1 tablet (1 tablet Oral Given 04/15/21 0019)  ondansetron (ZOFRAN-ODT) disintegrating tablet 4 mg (4 mg Oral Given 04/15/21 0019)    ED Course  I have reviewed the triage vital signs and the nursing notes.  Pertinent labs & imaging results that were available during my care of the patient were reviewed by me and considered in my medical decision making (see chart for details).     MDM Rules/Calculators/A&P  Initial impression-presents with URI-like symptoms single episode.  She is alert, does not appear in acute stress, vital signs reassuring.  Triage obtain basic lab work-up, will add on CT had she had a fall while on Eliquis, interrogate pacemaker, ambulate the patient reassessed.  Work-up-CBC shows slight leukocytosis 16.5, CMP shows sodium 134, glucose 157, lipase is 24, UA shows moderate leukocytes, white blood cells, rare bacteria, squamous cells present, mag was 2, TSH was 1.5, for troponin was 4, chest x-ray reveals ill-defined opacity within the left lung base may reflect atelectasis scarring or pneumonia, trace left pleural effusion is questionable.  CT head negative for acute findings.  EKG sinus without signs of ischemia.  Reassessment-patient was ambulated remained at or above 97% while on 4 L via nasal cannula which is her baseline.  Orthostatics were negative.  Patient is reassessed updated lab and imaging, she has no complaints this time, she is agreeable for discharge.  Rule out- low suspicion for CVA or intracranial head bleed as patient denies change in vision, paresthesias or weakness to upper lower extremities, no neuro deficits noted on exam, CT head did not reveal any acute findings.  Low suspicion for ACS or arrhythmias as patient denies chest pain, shortness of breath, no hypoperfusion or fluid overload on exam, EKG sinus without signs of ischemia, first troponin is 4, will defer second troponinand chest pain-free free and then 12 hours making ACS extremely unlikely at this time.  Low suspicion for arrhythmias as her pacemaker was interrogated did not show any acute abnormalities present..  Low suspicion for systemic infection as patient is nontoxic-appearing, vital signs reassuring, no obvious source infection noted on exam.  I have low suspicion patient be hospitalized due to viral illness she has no new oxygen requirements, she  is nontachypneic, nonhypoxic, showed no signs respiratory distress.   Plan-  Left lobe opacity-concern for possible pneumonia, will start her on antibiotics, have her continue with her nebulizer treatment, follow-up with pulmonology in a week's time for reevaluation.  Gave her strict return precautions. Syncopal like episode-unclear etiology, possible vasovagal she states that she has not been eating or drinking very much, continues to hydrate, follow-up with her PCP for further evaluation.  Vital signs have remained stable, no indication for hospital admission.  Patient discussed with attending and they agreed with assessment and plan.  Patient given at home care as well strict return precautions.  Patient verbalized that they understood agreed to said plan.  Final Clinical Impression(s) / ED Diagnoses Final diagnoses:  Community acquired pneumonia of left lower lobe of lung  Syncope, unspecified syncope type    Rx / DC Orders ED Discharge Orders          Ordered    azithromycin (ZITHROMAX) 250 MG tablet  Daily        04/15/21 0152    amoxicillin-clavulanate (AUGMENTIN) 875-125 MG tablet  Every 12 hours        04/15/21 0152    ondansetron (ZOFRAN) 4 MG tablet  Every 8 hours PRN        04/15/21 0152             Marcello Fennel, PA-C 04/15/21 0153    Ezequiel Essex, MD 04/16/21 1807    Ezequiel Essex, MD 04/16/21 1809

## 2021-04-15 NOTE — Discharge Instructions (Signed)
Chest x-ray reveals possible left lower lobe pneumonia starting you on antibiotics please take as prescribed.  Also given you Zofran please use as needed for nausea.  Recommend staying hydrated, you can use over-the-counter pain medications like ibuprofen Tylenol every 6 hours as needed for pain control as well as fever control.  Please follow-up with your pulmonologist for further evaluation.  Come back to the emergency department if you develop chest pain, shortness of breath, severe abdominal pain, uncontrolled nausea, vomiting, diarrhea.

## 2021-04-15 NOTE — ED Notes (Signed)
Oxygen levels remained above 97% while ambulating. Pt is on her normal 4lpm of oxygen via nasal canula

## 2021-04-17 ENCOUNTER — Encounter: Payer: Self-pay | Admitting: Internal Medicine

## 2021-04-17 ENCOUNTER — Non-Acute Institutional Stay: Payer: Medicare Other

## 2021-04-17 ENCOUNTER — Other Ambulatory Visit: Payer: Self-pay

## 2021-04-17 DIAGNOSIS — Z515 Encounter for palliative care: Secondary | ICD-10-CM

## 2021-04-17 NOTE — Progress Notes (Signed)
COMMUNITY PALLIATIVE CARE SW NOTE  PATIENT NAME: Kathleen [name] DOB: 08-10-1945 MRN: [id number]  PRIMARY CARE PROVIDER: [name], MD  RESPONSIBLE PARTY:  Acct ID - Guarantor Home Phone Work Phone Relationship Acct Type  [id number] VICKEE, MORMINO* [phone]  Self P/F     [address], [place], [place] [address]     PLAN OF CARE and INTERVENTIONS:             GOALS OF CARE/ ADVANCE CARE PLANNING:  Goal is for patient to remain in her apartment at [facility]. Patient report that she is a DNR, but is unable to locate form-SW to follow-up.  SOCIAL/EMOTIONAL/SPIRITUAL ASSESSMENT/ INTERVENTIONS:  SW completed an initial visit with patient at her independent apartment. SW provided education regarding the palliative care program. Patient verbalized understanding and provided verbal consent to services. Patient is alert and oriented x  4 and was very engaged and able to provide a status update on herself. Patient reported that she was recently diagnosed with pneumonia and is taking a regiment of antibiotics ( 250 mg of azithromycin, 1x day  for 4 days and 125 mg of Amoxicillin, 2x day for 6 days). Patient was diagnosed following a ED visit on 04/14/21. Patient report that she is running a 101-102 temperature today. She is currently on continuous o2 at 4L. SW observed a congested cough, which the patient report has worsen. Patient is independent for all ADL's, but admits she is having difficulty with showers due to not being able to raise her arms very high. She does have a shower chair. Patient is not open to shower assistance as she desires to try to do her personal care for as long a she can. Patient report having generalized pain all over, but particularly to her right shoulder and hip. She ambulates independently in her apartment, but uses her rolator when she goes down for meals or when she goes out to the doctor. Patient goals and advanced directives were discussed. Patient's goal is  to remain in her apartment. She also desires to feel better as she does not believe her current ABT regiment will be helpful. SOCIAL HISTORY: Patient moved to the facility from [place], [place]. She is widowed. She worked as a travel Geologist, engineering. She has three children. Patient report that her daughter serves as her HCPOA. She reports that is a DNR, but is unable to locate the form. She will follow-up with her physician. SW offered to assist her with advance directives. Patient is ongoing open to palliative care support/visits.  PATIENT/CAREGIVER EDUCATION/ COPING:  Patient appears to be coping adequately, but report that she is not feeling well overall. Patient's children are supportive and she has a supportive network of peers at there facility. PERSONAL EMERGENCY PLAN:  Per facility protocol.  COMMUNITY RESOURCES COORDINATION/ HEALTH CARE NAVIGATION:  None. FINANCIAL/LEGAL CONCERNS/INTERVENTIONS:  None     SOCIAL HX:  Social History   Tobacco Use   Smoking status: Former    Types: Cigarettes    Quit date: 06/09/1987    Years since quitting: 33.8   Smokeless tobacco: Never  Substance Use Topics   Alcohol use: No    Alcohol/week: 0.0 standard drinks    CODE STATUS: DNR noted by patient ADVANCED DIRECTIVES: Yes MOST FORM COMPLETE: No HOSPICE EDUCATION PROVIDED: No  PPS: Patient is alert and oriented x 4. She is on continuous o2. She is independent for ADL's.  Duration of visit and documentation: 60 minutes.   Clydia Llano,  LCSW

## 2021-04-18 NOTE — Telephone Encounter (Signed)
JD please advise on mychart message.  thanks

## 2021-04-22 ENCOUNTER — Ambulatory Visit (INDEPENDENT_AMBULATORY_CARE_PROVIDER_SITE_OTHER): Payer: Medicare Other | Admitting: Internal Medicine

## 2021-04-22 ENCOUNTER — Other Ambulatory Visit: Payer: Self-pay

## 2021-04-22 ENCOUNTER — Ambulatory Visit (INDEPENDENT_AMBULATORY_CARE_PROVIDER_SITE_OTHER): Payer: Medicare Other

## 2021-04-22 ENCOUNTER — Encounter: Payer: Self-pay | Admitting: Internal Medicine

## 2021-04-22 VITALS — BP 116/76 | HR 76 | Temp 98.3°F | Ht 62.0 in | Wt 223.0 lb

## 2021-04-22 DIAGNOSIS — I10 Essential (primary) hypertension: Secondary | ICD-10-CM | POA: Diagnosis not present

## 2021-04-22 DIAGNOSIS — K5904 Chronic idiopathic constipation: Secondary | ICD-10-CM

## 2021-04-22 DIAGNOSIS — Z23 Encounter for immunization: Secondary | ICD-10-CM | POA: Diagnosis not present

## 2021-04-22 DIAGNOSIS — E1140 Type 2 diabetes mellitus with diabetic neuropathy, unspecified: Secondary | ICD-10-CM | POA: Insufficient documentation

## 2021-04-22 DIAGNOSIS — J189 Pneumonia, unspecified organism: Secondary | ICD-10-CM | POA: Diagnosis not present

## 2021-04-22 DIAGNOSIS — M810 Age-related osteoporosis without current pathological fracture: Secondary | ICD-10-CM

## 2021-04-22 LAB — BASIC METABOLIC PANEL
BUN: 13 mg/dL (ref 6–23)
CO2: 29 mEq/L (ref 19–32)
Calcium: 9.9 mg/dL (ref 8.4–10.5)
Chloride: 103 mEq/L (ref 96–112)
Creatinine, Ser: 0.71 mg/dL (ref 0.40–1.20)
GFR: 83.82 mL/min (ref >60.00)
Glucose, Bld: 129 mg/dL — ABNORMAL HIGH (ref 70–99)
Potassium: 4.3 mEq/L (ref 3.5–5.1)
Sodium: 140 mEq/L (ref 135–145)

## 2021-04-22 LAB — CBC WITH DIFFERENTIAL/PLATELET
Basophils Absolute: 0.1 10*3/uL (ref 0.0–0.1)
Basophils Relative: 0.8 % (ref 0.0–3.0)
Eosinophils Absolute: 0.2 10*3/uL (ref 0.0–0.7)
Eosinophils Relative: 1.9 % (ref 0.0–5.0)
HCT: 37.6 % (ref 36.0–46.0)
Hemoglobin: 12.2 g/dL (ref 12.0–15.0)
Lymphocytes Relative: 8.2 % — ABNORMAL LOW (ref 12.0–46.0)
Lymphs Abs: 0.8 10*3/uL (ref 0.7–4.0)
MCHC: 32.4 g/dL (ref 30.0–36.0)
MCV: 80.9 fl (ref 78.0–100.0)
Monocytes Absolute: 0.7 10*3/uL (ref 0.1–1.0)
Monocytes Relative: 7.5 % (ref 3.0–12.0)
Neutro Abs: 8.1 10*3/uL — ABNORMAL HIGH (ref 1.4–7.7)
Neutrophils Relative %: 81.6 % — ABNORMAL HIGH (ref 43.0–77.0)
Platelets: 264 10*3/uL (ref 150.0–400.0)
RBC: 4.65 Mil/uL (ref 3.87–5.11)
RDW: 15.8 % — ABNORMAL HIGH (ref 11.5–15.5)
WBC: 9.9 10*3/uL (ref 4.0–10.5)

## 2021-04-22 LAB — HEMOGLOBIN A1C: Hgb A1c MFr Bld: 6.5 % (ref 4.6–6.5)

## 2021-04-22 MED ORDER — LINACLOTIDE 145 MCG PO CAPS: 145.0000 ug | ORAL_CAPSULE | Freq: Every day | ORAL | 1 refills | Status: DC

## 2021-04-22 NOTE — Progress Notes (Signed)
Subjective:  Patient ID: Kathleen [name], female    DOB: 18-Feb-1946  Age: 75 y.o. MRN: [id number]  CC: Diabetes and Cough  This visit occurred during the SARS-CoV-2 public health emergency.  Safety protocols were in place, including screening questions prior to the visit, additional usage of staff PPE, and extensive cleaning of exam room while observing appropriate contact time as indicated for disinfecting solutions.    HPI Kathleen [name] presents for f/up -  She was recently seen in the ED with upper respiratory sx's.  A chest x-ray suggested left lower lobe pneumonia with effusion.  She has completed a course of broad-spectrum antibiotics and is feeling much better.  She continues to have chills and feels weak and lightheaded but she denies fever, cough, diaphoresis, chest pain, or night sweats.  She wants to find a new endocrinologist to manage hypercalcemia and osteoporosis.  Outpatient Medications Prior to Visit  Medication Sig Dispense Refill   albuterol (PROVENTIL HFA;VENTOLIN HFA) 108 (90 Base) MCG/ACT inhaler Inhale 2 puffs into the lungs every 4 (four) hours as needed for wheezing or shortness of breath.     arformoterol (BROVANA) 15 MCG/2ML NEBU Take 2 mLs (15 mcg total) by nebulization 2 (two) times daily. 120 mL 11   azelastine (ASTELIN) 0.1 % nasal spray Place 2 sprays into both nostrils 2 (two) times daily.     budesonide (PULMICORT) 0.25 MG/2ML nebulizer solution Take 2 mLs (0.25 mg total) by nebulization 2 (two) times daily. (Patient taking differently: Take 0.25 mg by nebulization 2 (two) times daily as needed (wheezing/shortness of breath).) 60 mL 12   Cholecalciferol (VITAMIN D3) 50 MCG (2000 UT) TABS Take 2,000 Units by mouth daily.     cyanocobalamin (,VITAMIN B-12,) 1000 MCG/ML injection Inject 1 mL (1,000 mcg total) into the muscle every 30 (thirty) days. 10 mL 0   docusate sodium (COLACE) 100 MG capsule Take 100 mg by mouth 2 (two) times daily as needed for mild  constipation.     dofetilide (TIKOSYN) 250 MCG capsule Take 250 mcg by mouth in the morning and at bedtime.     ezetimibe (ZETIA) 10 MG tablet Take 10 mg by mouth at bedtime.     febuxostat (ULORIC) 40 MG tablet Take 40 mg by mouth at bedtime.     ipratropium-albuterol (DUONEB) 0.5-2.5 (3) MG/3ML SOLN Inhale 3 mLs into the lungs every 6 (six) hours as needed (wheezing/shortness of breath).     ketoconazole (NIZORAL) 2 % cream Apply 1 application topically 2 (two) times daily. 60 g 2   levothyroxine (SYNTHROID) 50 MCG tablet Take 50 mcg by mouth daily before breakfast.  2   losartan (COZAAR) 25 MG tablet Take 1 tablet (25 mg total) by mouth at bedtime. 30 tablet 6   Magnesium 500 MG TABS Take 500 mg by mouth every evening.     ondansetron (ZOFRAN) 4 MG tablet Take 1 tablet (4 mg total) by mouth every 8 (eight) hours as needed for nausea or vomiting. 12 tablet 0   predniSONE (DELTASONE) 5 MG tablet Take 0.5 tablets (2.5 mg total) by mouth daily with breakfast. 15 tablet 0   rivaroxaban (XARELTO) 20 MG TABS tablet Take 20 mg by mouth daily with supper.      spironolactone (ALDACTONE) 25 MG tablet Take 25 mg by mouth daily.     torsemide (DEMADEX) 20 MG tablet Take 1 tablet (20 mg total) by mouth 2 (two) times a week. Take extra tab for weight 222 lbs  or greater (Patient taking differently: Take 20 mg by mouth 2 (two) times a week. Take extra tab for weight 2 lbs or greater) 20 tablet 6   traMADol (ULTRAM) 50 MG tablet Take 50 mg by mouth every 6 (six) hours as needed for moderate pain.     YUPELRI 175 MCG/3ML nebulizer solution USE 1 VIAL IN NEBULIZER DAILY - DO NOT MIX WITH OTHER NEB MEDS,USE BEFORE OR AFTER (Patient taking differently: Take 175 mcg by nebulization daily.) 30 mL 11   No facility-administered medications prior to visit.    ROS Review of Systems  Constitutional:  Positive for chills. Negative for diaphoresis, fatigue and fever.  HENT: Negative.    Eyes: Negative.   Respiratory:   Positive for shortness of breath. Negative for cough, choking, chest tightness and wheezing.   Cardiovascular:  Negative for chest pain, palpitations and leg swelling.  Gastrointestinal:  Positive for constipation. Negative for abdominal pain, diarrhea, nausea and vomiting.       She has had chronic, worsening constipation for several years.  She is not getting any symptom relief with Dulcolax or MiraLAX.  Endocrine: Negative.  Negative for polydipsia, polyphagia and polyuria.  Genitourinary: Negative.  Negative for difficulty urinating.  Musculoskeletal: Negative.   Skin: Negative.   Neurological:  Positive for weakness and light-headedness. Negative for dizziness and numbness.  Hematological:  Negative for adenopathy. Does not bruise/bleed easily.  Psychiatric/Behavioral: Negative.     Objective:  BP 116/76 (BP Location: Right Arm, Patient Position: Sitting, Cuff Size: Large)   Pulse 76   Temp 98.3 F (36.8 C) (Oral)   Ht 5\' 2"  (1.575 m)   Wt 223 lb (101.2 kg)   SpO2 97%   BMI 40.79 kg/m   BP Readings from Last 3 Encounters:  04/22/21 116/76  04/15/21 135/60  03/27/21 122/68    Wt Readings from Last 3 Encounters:  04/22/21 223 lb (101.2 kg)  03/27/21 222 lb (100.7 kg)  02/13/21 221 lb 9.6 oz (100.5 kg)    Physical Exam Vitals reviewed.  Constitutional:      General: She is not in acute distress.    Appearance: She is obese. She is not toxic-appearing or diaphoretic.  HENT:     Nose: Nose normal.     Mouth/Throat:     Mouth: Mucous membranes are moist.  Eyes:     General: No scleral icterus.    Conjunctiva/sclera: Conjunctivae normal.  Cardiovascular:     Rate and Rhythm: Normal rate and regular rhythm.     Heart sounds: S1 normal and S2 normal. Heart sounds are distant. No murmur heard.   Gallop present. S4 sounds present.  Pulmonary:     Effort: Pulmonary effort is normal. No accessory muscle usage or respiratory distress.     Breath sounds: No stridor.  Examination of the right-upper field reveals decreased breath sounds. Examination of the left-upper field reveals decreased breath sounds. Examination of the right-middle field reveals decreased breath sounds. Examination of the left-middle field reveals decreased breath sounds. Examination of the right-lower field reveals decreased breath sounds. Examination of the left-lower field reveals decreased breath sounds. Decreased breath sounds present. No wheezing, rhonchi or rales.  Abdominal:     General: Abdomen is protuberant. Bowel sounds are normal. There is no distension.     Palpations: Abdomen is soft. There is no hepatomegaly, splenomegaly or mass.     Tenderness: There is no abdominal tenderness.  Musculoskeletal:     Cervical back: Neck supple.  Right lower leg: Pitting Edema (trace) present.     Left lower leg: Pitting Edema (trace) present.  Lymphadenopathy:     Cervical: No cervical adenopathy.  Neurological:     Mental Status: She is alert.    Lab Results  Component Value Date   WBC 9.9 04/22/2021   HGB 12.2 04/22/2021   HCT 37.6 04/22/2021   PLT 264.0 04/22/2021   GLUCOSE 129 (H) 04/22/2021   ALT 11 04/14/2021   AST 12 (L) 04/14/2021   NA 140 04/22/2021   K 4.3 04/22/2021   CL 103 04/22/2021   CREATININE 0.71 04/22/2021   BUN 13 04/22/2021   CO2 29 04/22/2021   TSH 1.502 04/14/2021   INR 1.11 09/08/2015   HGBA1C 6.5 04/22/2021    CT Head Wo Contrast  Result Date: 04/15/2021 CLINICAL DATA:  Dizziness, syncope, fall, leukocytosis EXAM: CT HEAD WITHOUT CONTRAST TECHNIQUE: Contiguous axial images were obtained from the base of the skull through the vertex without intravenous contrast. COMPARISON:  05/21/2017 FINDINGS: Brain: No evidence of acute infarction, hemorrhage, hydrocephalus, extra-axial collection or mass lesion/mass effect. Mild cortical atrophy. Vascular: No hyperdense vessel or unexpected calcification. Skull: Normal. Negative for fracture or focal lesion.  Sinuses/Orbits: The visualized paranasal sinuses are essentially clear. The mastoid air cells are unopacified. Other: None. IMPRESSION: No evidence of acute intracranial abnormality. Mild cortical atrophy. Electronically Signed   By: Charline Bills M.D.   On: 04/15/2021 01:02   DG Chest Port 1 View  Result Date: 04/14/2021 CLINICAL DATA:  Shortness of breath, cough. Additional history provided: Abdominal pain and nausea for 2 days, dizziness and syncope this morning. History of COPD, CHF, hypertension, asthma, atrial fibrillation, pacemaker. EXAM: PORTABLE CHEST 1 VIEW COMPARISON:  CT chest 01/07/2021. Chest radiographs 12/10/2020 and earlier. FINDINGS: Redemonstrated left chest multi lead implantable cardiac device. Heart size at the upper limits of normal, unchanged. Aortic atherosclerosis. Streaky opacities within the right lung base compatible with atelectasis. Mild ill-defined opacity within the left lung base, which may reflect atelectasis, scarring or pneumonia. Trace left pleural effusion is questioned. No evidence of pneumothorax. No acute bony abnormality identified. IMPRESSION: Ill-defined opacity within the left lung base which may reflect atelectasis, scarring or pneumonia. A trace left pleural effusion is also questioned. Mild atelectasis within the right lung base. Aortic Atherosclerosis (ICD10-I70.0). Electronically Signed   By: Jackey Loge D.O.   On: 04/14/2021 10:43   DG Chest 2 View  Result Date: 04/22/2021 CLINICAL DATA:  Pneumonia follow-up. EXAM: CHEST - 2 VIEW COMPARISON:  Chest x-ray dated April 14, 2021. FINDINGS: Unchanged left chest wall pacemaker. Normal heart size. Normal pulmonary vascularity. Bibasilar scarring and atelectasis. Aeration at the left lung base has slightly improved. No pleural effusion or pneumothorax. No acute osseous abnormality. IMPRESSION: 1. Improved aeration at the left lung base. Electronically Signed   By: Obie Dredge M.D.   On: 04/22/2021  10:51     Assessment & Plan:   Kathleen [name] was seen today for diabetes and cough.  Diagnoses and all orders for this visit:  Pneumonia of left lower lobe due to infectious organism- Based on her symptoms, exam, and chest x-ray this has resolved. -     DG Chest 2 View; Future -     CBC with Differential/Platelet; Future -     CBC with Differential/Platelet  Age-related osteoporosis without current pathological fracture -     Ambulatory referral to Endocrinology  Type 2 diabetes mellitus with diabetic neuropathy, without long-term current use of  insulin (HCC)- I recommended that she start taking a GLP-1 agonist. -     Basic metabolic panel; Future -     Hemoglobin A1c; Future -     Hemoglobin A1c -     Basic metabolic panel -     Semaglutide (RYBELSUS) 3 MG TABS; Take 3 mg by mouth daily.  Primary hypertension- Her blood pressure is very well controlled. -     CBC with Differential/Platelet; Future -     Basic metabolic panel; Future -     Basic metabolic panel -     CBC with Differential/Platelet  Flu vaccine need -     Flu Vaccine QUAD High Dose(Fluad)  Chronic idiopathic constipation -     linaclotide (LINZESS) 145 MCG CAPS capsule; Take 1 capsule (145 mcg total) by mouth daily before breakfast.  I am having Kathleen [name] start on linaclotide and Rybelsus. I am also having her maintain her levothyroxine, rivaroxaban, albuterol, febuxostat, Magnesium, azelastine, dofetilide, ezetimibe, spironolactone, Vitamin D3, ipratropium-albuterol, docusate sodium, losartan, torsemide, ketoconazole, arformoterol, cyanocobalamin, predniSONE, Yupelri, budesonide, traMADol, and ondansetron.  Meds ordered this encounter  Medications   linaclotide (LINZESS) 145 MCG CAPS capsule    Sig: Take 1 capsule (145 mcg total) by mouth daily before breakfast.    Dispense:  90 capsule    Refill:  1   Semaglutide (RYBELSUS) 3 MG TABS    Sig: Take 3 mg by mouth daily.    Dispense:  30 tablet    Refill:   0      Follow-up: Return in about 3 months (around 07/23/2021).  Sanda Linger, MD

## 2021-04-22 NOTE — Patient Instructions (Signed)
Type 2 Diabetes Mellitus, Diagnosis, Adult ?Type 2 diabetes (type 2 diabetes mellitus) is a long-term, or chronic, disease. In type 2 diabetes, one or both of these problems may be present: ?The pancreas does not make enough of a hormone called insulin. ?Cells in the body do not respond properly to the insulin that the body makes (insulin resistance). ?Normally, insulin allows blood sugar (glucose) to enter cells in the body. The cells use glucose for energy. Insulin resistance or lack of insulin causes excess glucose to build up in the blood instead of going into cells. This causes high blood glucose (hyperglycemia).  ?What are the causes? ?The exact cause of type 2 diabetes is not known. ?What increases the risk? ?The following factors may make you more likely to develop this condition: ?Having a family member with type 2 diabetes. ?Being overweight or obese. ?Being inactive (sedentary). ?Having been diagnosed with insulin resistance. ?Having a history of prediabetes, diabetes when you were pregnant (gestational diabetes), or polycystic ovary syndrome (PCOS). ?What are the signs or symptoms? ?In the early stage of this condition, you may not have symptoms. Symptoms develop slowly and may include: ?Increased thirst or hunger. ?Increased urination. ?Unexplained weight loss. ?Tiredness (fatigue) or weakness. ?Vision changes, such as blurry vision. ?Dark patches on the skin. ?How is this diagnosed? ?This condition is diagnosed based on your symptoms, your medical history, a physical exam, and your blood glucose level. Your blood glucose may be checked with one or more of the following blood tests: ?A fasting blood glucose (FBG) test. You will not be allowed to eat (you will fast) for 8 hours or longer before a blood sample is taken. ?A random blood glucose test. This test checks blood glucose at any time of day regardless of when you ate. ?An A1C (hemoglobin A1C) blood test. This test provides information about blood  glucose levels over the previous 2-3 months. ?An oral glucose tolerance test (OGTT). This test measures your blood glucose at two times: ?After fasting. This is your baseline blood glucose level. ?Two hours after drinking a beverage that contains glucose. ?You may be diagnosed with type 2 diabetes if: ?Your fasting blood glucose level is 126 mg/dL (7.0 mmol/L) or higher. ?Your random blood glucose level is 200 mg/dL (11.1 mmol/L) or higher. ?Your A1C level is 6.5% or higher. ?Your oral glucose tolerance test result is higher than 200 mg/dL (11.1 mmol/L). ?These blood tests may be repeated to confirm your diagnosis. ?How is this treated? ?Your treatment may be managed by a specialist called an endocrinologist. Type 2 diabetes may be treated by following instructions from your health care provider about: ?Making dietary and lifestyle changes. These may include: ?Following a personalized nutrition plan that is developed by a registered dietitian. ?Exercising regularly. ?Finding ways to manage stress. ?Checking your blood glucose level as often as told. ?Taking diabetes medicines or insulin daily. This helps to keep your blood glucose levels in the healthy range. ?Taking medicines to help prevent complications from diabetes. Medicines may include: ?Aspirin. ?Medicine to lower cholesterol. ?Medicine to control blood pressure. ?Your health care provider will set treatment goals for you. Your goals will be based on your age, other medical conditions you have, and how you respond to diabetes treatment. Generally, the goal of treatment is to maintain the following blood glucose levels: ?Before meals: 80-130 mg/dL (4.4-7.2 mmol/L). ?After meals: below 180 mg/dL (10 mmol/L). ?A1C level: less than 7%. ?Follow these instructions at home: ?Questions to ask your health care provider ?  Consider asking the following questions: ?Should I meet with a certified diabetes care and education specialist? ?What diabetes medicines do I need,  and when should I take them? ?What equipment will I need to manage my diabetes at home? ?How often do I need to check my blood glucose? ?Where can I find a support group for people with diabetes? ?What number can I call if I have questions? ?When is my next appointment? ?General instructions ?Take over-the-counter and prescription medicines only as told by your health care provider. ?Keep all follow-up visits. This is important. ?Where to find more information ?For help and guidance and for more information about diabetes, please visit: ?American Diabetes Association (ADA): www.diabetes.org ?American Association of Diabetes Care and Education Specialists (ADCES): www.diabeteseducator.org ?International Diabetes Federation (IDF): www.idf.org ?Contact a health care provider if: ?Your blood glucose is at or above 240 mg/dL (13.3 mmol/L) for 2 days in a row. ?You have been sick or have had a fever for 2 days or longer, and you are not getting better. ?You have any of the following problems for more than 6 hours: ?You cannot eat or drink. ?You have nausea and vomiting. ?You have diarrhea. ?Get help right away if: ?You have severe hypoglycemia. This means your blood glucose is lower than 54 mg/dL (3.0 mmol/L). ?You become confused or you have trouble thinking clearly. ?You have difficulty breathing. ?You have moderate or large ketone levels in your urine. ?These symptoms may represent a serious problem that is an emergency. Do not wait to see if the symptoms will go away. Get medical help right away. Call your local emergency services (911 in the [place] Do not drive yourself to the hospital. ?Summary ?Type 2 diabetes mellitus is a long-term, or chronic, disease. In type 2 diabetes, the pancreas does not make enough of a hormone called insulin, or cells in the body do not respond properly to insulin that the body makes. ?This condition is treated by making dietary and lifestyle changes and taking diabetes medicines or  insulin. ?Your health care provider will set treatment goals for you. Your goals will be based on your age, other medical conditions you have, and how you respond to diabetes treatment. ?Keep all follow-up visits. This is important. ?This information is not intended to replace advice given to you by your health care provider. Make sure you discuss any questions you have with your health care provider. ?Document Revised: 08/19/2020 Document Reviewed: 08/19/2020 ?Elsevier Patient Education ? 2022 [employer] ? ?

## 2021-04-23 MED ORDER — RYBELSUS 3 MG PO TABS: 3.0000 mg | ORAL_TABLET | Freq: Every day | ORAL | 0 refills | Status: DC

## 2021-04-29 ENCOUNTER — Encounter: Payer: Self-pay | Admitting: Internal Medicine

## 2021-04-29 ENCOUNTER — Encounter: Payer: Self-pay | Admitting: Pulmonary Disease

## 2021-04-29 NOTE — Telephone Encounter (Signed)
JD please advise thanks  I was taking Budesonide .50mg , which you changed to Budesonide .25  because I have been having joint pain. I have re-ordered the  Budesonide .25. I still have joint pain, but not as bad. Also, breathing is not as good with Budesonide .25. I tried taking Aleve for the pain in the joints, but have been told I can not take it with Xarelto.  Could you please either tell me something I can take to help with the pain or change to an entirely new drug.  Thank you

## 2021-05-05 ENCOUNTER — Telehealth (HOSPITAL_COMMUNITY): Payer: Self-pay | Admitting: *Deleted

## 2021-05-05 NOTE — Telephone Encounter (Signed)
Pt called c/o dizziness and heart racing off/on. Had lengthy conversation with pt, she states last Tue her heart was racing off/on and then since Thursday she has been having dizziness off/on with today being the worst. She reports passing out in early Nov and being diagnosed with pna, she states this improved however she continues to run a low grade fever (around 100) daily, she has been in contact with pcp regarding this. Cardiomems readings showed HR was 120s last Tue but Wed and today has been in 80s. Per Dr Gala Romney pt needs 14 day Zio to eval/quantify a-fib, pt agreeable and will come tomorrow for monitor.

## 2021-05-06 ENCOUNTER — Ambulatory Visit (HOSPITAL_COMMUNITY)
Admission: RE | Admit: 2021-05-06 | Discharge: 2021-05-06 | Disposition: A | Discharge status: Home / Self Care | Payer: Medicare Other | Source: Ambulatory Visit | Attending: Internal Medicine | Admitting: Internal Medicine

## 2021-05-06 ENCOUNTER — Other Ambulatory Visit: Payer: Self-pay

## 2021-05-06 ENCOUNTER — Ambulatory Visit (HOSPITAL_COMMUNITY)
Admission: RE | Admit: 2021-05-06 | Discharge: 2021-05-06 | Disposition: A | Discharge status: Home / Self Care | Payer: Medicare Other | Source: Ambulatory Visit | Attending: Cardiology | Admitting: Cardiology

## 2021-05-06 ENCOUNTER — Other Ambulatory Visit (HOSPITAL_COMMUNITY): Payer: Self-pay | Admitting: Internal Medicine

## 2021-05-06 VITALS — BP 110/70 | Wt 221.6 lb

## 2021-05-06 DIAGNOSIS — I10 Essential (primary) hypertension: Secondary | ICD-10-CM | POA: Insufficient documentation

## 2021-05-06 DIAGNOSIS — Z0130 Encounter for examination of blood pressure without abnormal findings: Secondary | ICD-10-CM | POA: Insufficient documentation

## 2021-05-06 DIAGNOSIS — I4891 Unspecified atrial fibrillation: Secondary | ICD-10-CM

## 2021-05-06 DIAGNOSIS — I4819 Other persistent atrial fibrillation: Secondary | ICD-10-CM

## 2021-05-06 NOTE — Progress Notes (Signed)
Pt in for Zio per Dr. Gala Romney. BP checked  Zio patch placed onto patient.  All instructions and information reviewed with patient, they verbalize understanding with no questions.

## 2021-05-08 ENCOUNTER — Encounter: Payer: Self-pay | Admitting: Internal Medicine

## 2021-05-10 ENCOUNTER — Inpatient Hospital Stay (HOSPITAL_BASED_OUTPATIENT_CLINIC_OR_DEPARTMENT_OTHER)
Admission: EM | Admit: 2021-05-10 | Discharge: 2021-05-16 | DRG: 872 | Disposition: A | Discharge status: Home / Self Care | Payer: Medicare Other | Attending: Family Medicine | Admitting: Family Medicine

## 2021-05-10 ENCOUNTER — Other Ambulatory Visit: Payer: Self-pay

## 2021-05-10 ENCOUNTER — Emergency Department (HOSPITAL_BASED_OUTPATIENT_CLINIC_OR_DEPARTMENT_OTHER): Payer: Medicare Other

## 2021-05-10 ENCOUNTER — Encounter (HOSPITAL_BASED_OUTPATIENT_CLINIC_OR_DEPARTMENT_OTHER): Payer: Self-pay

## 2021-05-10 DIAGNOSIS — I495 Sick sinus syndrome: Secondary | ICD-10-CM | POA: Diagnosis present

## 2021-05-10 DIAGNOSIS — N19 Unspecified kidney failure: Secondary | ICD-10-CM | POA: Diagnosis not present

## 2021-05-10 DIAGNOSIS — E039 Hypothyroidism, unspecified: Secondary | ICD-10-CM | POA: Diagnosis present

## 2021-05-10 DIAGNOSIS — E860 Dehydration: Secondary | ICD-10-CM | POA: Diagnosis present

## 2021-05-10 DIAGNOSIS — Z950 Presence of cardiac pacemaker: Secondary | ICD-10-CM

## 2021-05-10 DIAGNOSIS — M810 Age-related osteoporosis without current pathological fracture: Secondary | ICD-10-CM | POA: Diagnosis present

## 2021-05-10 DIAGNOSIS — Z87891 Personal history of nicotine dependence: Secondary | ICD-10-CM

## 2021-05-10 DIAGNOSIS — J8489 Other specified interstitial pulmonary diseases: Secondary | ICD-10-CM | POA: Diagnosis present

## 2021-05-10 DIAGNOSIS — I428 Other cardiomyopathies: Secondary | ICD-10-CM | POA: Diagnosis present

## 2021-05-10 DIAGNOSIS — Z9981 Dependence on supplemental oxygen: Secondary | ICD-10-CM

## 2021-05-10 DIAGNOSIS — B372 Candidiasis of skin and nail: Secondary | ICD-10-CM | POA: Diagnosis present

## 2021-05-10 DIAGNOSIS — L03314 Cellulitis of groin: Secondary | ICD-10-CM | POA: Diagnosis present

## 2021-05-10 DIAGNOSIS — I10 Essential (primary) hypertension: Secondary | ICD-10-CM | POA: Diagnosis present

## 2021-05-10 DIAGNOSIS — I4819 Other persistent atrial fibrillation: Secondary | ICD-10-CM

## 2021-05-10 DIAGNOSIS — I442 Atrioventricular block, complete: Secondary | ICD-10-CM | POA: Diagnosis present

## 2021-05-10 DIAGNOSIS — A419 Sepsis, unspecified organism: Principal | ICD-10-CM | POA: Diagnosis present

## 2021-05-10 DIAGNOSIS — Z888 Allergy status to other drugs, medicaments and biological substances status: Secondary | ICD-10-CM

## 2021-05-10 DIAGNOSIS — D72829 Elevated white blood cell count, unspecified: Secondary | ICD-10-CM

## 2021-05-10 DIAGNOSIS — I4891 Unspecified atrial fibrillation: Secondary | ICD-10-CM | POA: Diagnosis present

## 2021-05-10 DIAGNOSIS — E669 Obesity, unspecified: Secondary | ICD-10-CM | POA: Diagnosis present

## 2021-05-10 DIAGNOSIS — Z887 Allergy status to serum and vaccine status: Secondary | ICD-10-CM

## 2021-05-10 DIAGNOSIS — M109 Gout, unspecified: Secondary | ICD-10-CM | POA: Diagnosis present

## 2021-05-10 DIAGNOSIS — I5032 Chronic diastolic (congestive) heart failure: Secondary | ICD-10-CM | POA: Diagnosis not present

## 2021-05-10 DIAGNOSIS — G4733 Obstructive sleep apnea (adult) (pediatric): Secondary | ICD-10-CM | POA: Diagnosis present

## 2021-05-10 DIAGNOSIS — Z881 Allergy status to other antibiotic agents status: Secondary | ICD-10-CM

## 2021-05-10 DIAGNOSIS — Z79890 Hormone replacement therapy (postmenopausal): Secondary | ICD-10-CM

## 2021-05-10 DIAGNOSIS — I130 Hypertensive heart and chronic kidney disease with heart failure and stage 1 through stage 4 chronic kidney disease, or unspecified chronic kidney disease: Secondary | ICD-10-CM | POA: Diagnosis present

## 2021-05-10 DIAGNOSIS — L03313 Cellulitis of chest wall: Secondary | ICD-10-CM | POA: Diagnosis present

## 2021-05-10 DIAGNOSIS — J9611 Chronic respiratory failure with hypoxia: Secondary | ICD-10-CM | POA: Diagnosis present

## 2021-05-10 DIAGNOSIS — Z7901 Long term (current) use of anticoagulants: Secondary | ICD-10-CM

## 2021-05-10 DIAGNOSIS — I5030 Unspecified diastolic (congestive) heart failure: Secondary | ICD-10-CM | POA: Diagnosis present

## 2021-05-10 DIAGNOSIS — Z6841 Body Mass Index (BMI) 40.0 and over, adult: Secondary | ICD-10-CM

## 2021-05-10 DIAGNOSIS — Z79899 Other long term (current) drug therapy: Secondary | ICD-10-CM

## 2021-05-10 DIAGNOSIS — I4811 Longstanding persistent atrial fibrillation: Secondary | ICD-10-CM | POA: Diagnosis present

## 2021-05-10 DIAGNOSIS — Z20822 Contact with and (suspected) exposure to covid-19: Secondary | ICD-10-CM | POA: Diagnosis present

## 2021-05-10 DIAGNOSIS — N61 Mastitis without abscess: Secondary | ICD-10-CM | POA: Insufficient documentation

## 2021-05-10 DIAGNOSIS — J449 Chronic obstructive pulmonary disease, unspecified: Secondary | ICD-10-CM | POA: Diagnosis present

## 2021-05-10 DIAGNOSIS — Z803 Family history of malignant neoplasm of breast: Secondary | ICD-10-CM

## 2021-05-10 DIAGNOSIS — L304 Erythema intertrigo: Secondary | ICD-10-CM | POA: Diagnosis present

## 2021-05-10 LAB — CBC WITH DIFFERENTIAL/PLATELET
Abs Immature Granulocytes: 0.04 10*3/uL (ref 0.00–0.07)
Basophils Absolute: 0.1 10*3/uL (ref 0.0–0.1)
Basophils Relative: 1 %
Eosinophils Absolute: 0.1 10*3/uL (ref 0.0–0.5)
Eosinophils Relative: 1 %
HCT: 39.3 % (ref 36.0–46.0)
Hemoglobin: 12.2 g/dL (ref 12.0–15.0)
Immature Granulocytes: 0 %
Lymphocytes Relative: 6 %
Lymphs Abs: 0.8 10*3/uL (ref 0.7–4.0)
MCH: 25.4 pg — ABNORMAL LOW (ref 26.0–34.0)
MCHC: 31 g/dL (ref 30.0–36.0)
MCV: 81.9 fL (ref 80.0–100.0)
Monocytes Absolute: 1 10*3/uL (ref 0.1–1.0)
Monocytes Relative: 8 %
Neutro Abs: 11 10*3/uL — ABNORMAL HIGH (ref 1.7–7.7)
Neutrophils Relative %: 84 %
Platelets: 238 10*3/uL (ref 150–400)
RBC: 4.8 MIL/uL (ref 3.87–5.11)
RDW: 14.7 % (ref 11.5–15.5)
WBC: 13 10*3/uL — ABNORMAL HIGH (ref 4.0–10.5)
nRBC: 0 % (ref 0.0–0.2)

## 2021-05-10 LAB — RESP PANEL BY RT-PCR (FLU A&B, COVID) ARPGX2
Influenza A by PCR: NEGATIVE
Influenza B by PCR: NEGATIVE
SARS Coronavirus 2 by RT PCR: NEGATIVE

## 2021-05-10 LAB — URINALYSIS, ROUTINE W REFLEX MICROSCOPIC
Bilirubin Urine: NEGATIVE
Glucose, UA: NEGATIVE mg/dL
Hgb urine dipstick: NEGATIVE
Ketones, ur: NEGATIVE mg/dL
Nitrite: NEGATIVE
Protein, ur: NEGATIVE mg/dL
Specific Gravity, Urine: 1.012 (ref 1.005–1.030)
pH: 5.5 (ref 5.0–8.0)

## 2021-05-10 LAB — COMPREHENSIVE METABOLIC PANEL
ALT: 6 U/L (ref 0–44)
AST: 9 U/L — ABNORMAL LOW (ref 15–41)
Albumin: 4.1 g/dL (ref 3.5–5.0)
Alkaline Phosphatase: 46 U/L (ref 38–126)
Anion gap: 10 (ref 5–15)
BUN: 13 mg/dL (ref 8–23)
CO2: 25 mmol/L (ref 22–32)
Calcium: 10 mg/dL (ref 8.9–10.3)
Chloride: 101 mmol/L (ref 98–111)
Creatinine, Ser: 0.64 mg/dL (ref 0.44–1.00)
GFR, Estimated: >60 mL/min (ref >60)
Glucose, Bld: 132 mg/dL — ABNORMAL HIGH (ref 70–99)
Potassium: 4 mmol/L (ref 3.5–5.1)
Sodium: 136 mmol/L (ref 135–145)
Total Bilirubin: 0.8 mg/dL (ref 0.3–1.2)
Total Protein: 6.6 g/dL (ref 6.5–8.1)

## 2021-05-10 LAB — PROTIME-INR
INR: 1.4 — ABNORMAL HIGH (ref 0.8–1.2)
Prothrombin Time: 17.5 seconds — ABNORMAL HIGH (ref 11.4–15.2)

## 2021-05-10 LAB — LACTIC ACID, PLASMA
Lactic Acid, Venous: 1.8 mmol/L (ref 0.5–1.9)
Lactic Acid, Venous: 1.6 mmol/L (ref 0.5–1.9)

## 2021-05-10 LAB — APTT: aPTT: 35 seconds (ref 24–36)

## 2021-05-10 MED ORDER — ARFORMOTEROL TARTRATE 15 MCG/2ML IN NEBU
15.0000 ug | INHALATION_SOLUTION | Freq: Two times a day (BID) | RESPIRATORY_TRACT | Status: DC
  Administered 2021-05-10 – 2021-05-16 (×9): 15 ug via RESPIRATORY_TRACT
  Filled 2021-05-10 (×13): qty 2

## 2021-05-10 MED ORDER — SODIUM CHLORIDE 0.9 % IV SOLN: INTRAVENOUS | Status: DC | PRN

## 2021-05-10 MED ORDER — TRAMADOL HCL 50 MG PO TABS
50.0000 mg | ORAL_TABLET | Freq: Four times a day (QID) | ORAL | Status: DC | PRN
  Administered 2021-05-10 – 2021-05-14 (×9): 50 mg via ORAL
  Filled 2021-05-10 (×9): qty 1

## 2021-05-10 MED ORDER — ALBUTEROL SULFATE (2.5 MG/3ML) 0.083% IN NEBU: 2.5000 mg | INHALATION_SOLUTION | RESPIRATORY_TRACT | Status: DC | PRN

## 2021-05-10 MED ORDER — ACETAMINOPHEN 650 MG RE SUPP: 650.0000 mg | Freq: Four times a day (QID) | RECTAL | Status: DC | PRN

## 2021-05-10 MED ORDER — ACETAMINOPHEN 325 MG PO TABS
650.0000 mg | ORAL_TABLET | Freq: Once | ORAL | Status: AC
  Administered 2021-05-10: 650 mg via ORAL
  Filled 2021-05-10: qty 2

## 2021-05-10 MED ORDER — ACETAMINOPHEN 325 MG PO TABS
650.0000 mg | ORAL_TABLET | Freq: Four times a day (QID) | ORAL | Status: DC | PRN
  Administered 2021-05-12: 650 mg via ORAL
  Filled 2021-05-10: qty 2

## 2021-05-10 MED ORDER — ONDANSETRON HCL 4 MG/2ML IJ SOLN
4.0000 mg | Freq: Four times a day (QID) | INTRAMUSCULAR | Status: DC | PRN
  Administered 2021-05-10 – 2021-05-14 (×3): 4 mg via INTRAVENOUS
  Filled 2021-05-10 (×3): qty 2

## 2021-05-10 MED ORDER — LACTATED RINGERS IV SOLN: INTRAVENOUS | Status: AC

## 2021-05-10 MED ORDER — LEVOTHYROXINE SODIUM 50 MCG PO TABS
50.0000 ug | ORAL_TABLET | Freq: Every day | ORAL | Status: DC
  Administered 2021-05-11 – 2021-05-16 (×6): 50 ug via ORAL
  Filled 2021-05-10 (×6): qty 1

## 2021-05-10 MED ORDER — GERHARDT'S BUTT CREAM
TOPICAL_CREAM | Freq: Two times a day (BID) | CUTANEOUS | Status: DC
  Filled 2021-05-10: qty 1

## 2021-05-10 MED ORDER — DOFETILIDE 250 MCG PO CAPS
250.0000 ug | ORAL_CAPSULE | Freq: Two times a day (BID) | ORAL | Status: DC
  Administered 2021-05-10 – 2021-05-16 (×12): 250 ug via ORAL
  Filled 2021-05-10 (×13): qty 1

## 2021-05-10 MED ORDER — EZETIMIBE 10 MG PO TABS
10.0000 mg | ORAL_TABLET | Freq: Every day | ORAL | Status: DC
  Administered 2021-05-10 – 2021-05-15 (×6): 10 mg via ORAL
  Filled 2021-05-10 (×6): qty 1

## 2021-05-10 MED ORDER — SODIUM CHLORIDE 0.9 % IV SOLN
1.0000 g | INTRAVENOUS | Status: DC
  Administered 2021-05-11 – 2021-05-12 (×2): 1 g via INTRAVENOUS
  Filled 2021-05-10 (×2): qty 10

## 2021-05-10 MED ORDER — SODIUM CHLORIDE 0.9 % IV SOLN
2.0000 g | Freq: Once | INTRAVENOUS | Status: AC
  Administered 2021-05-10: 2 g via INTRAVENOUS
  Filled 2021-05-10: qty 20

## 2021-05-10 MED ORDER — RIVAROXABAN 20 MG PO TABS
20.0000 mg | ORAL_TABLET | Freq: Every day | ORAL | Status: DC
  Administered 2021-05-10 – 2021-05-15 (×6): 20 mg via ORAL
  Filled 2021-05-10 (×6): qty 1

## 2021-05-10 MED ORDER — LACTATED RINGERS IV BOLUS (SEPSIS)
500.0000 mL | Freq: Once | INTRAVENOUS | Status: AC
  Administered 2021-05-10: 500 mL via INTRAVENOUS

## 2021-05-10 NOTE — ED Notes (Signed)
Pt assisted to [facility], Urine obtained and Covid swab.

## 2021-05-10 NOTE — ED Notes (Signed)
Pt also added that she has had intermittent dizziness for the past 2-3 days. Pt stated that she is unsure if she reported this to the RN or MD earlier. Pt states she is "Ok at this time. Pt also added that she thinks the intermittent pain to her chest is related to the cellulitis and movement.

## 2021-05-10 NOTE — H&P (Signed)
History and Physical    PLEASE NOTE THAT DRAGON DICTATION SOFTWARE WAS USED IN THE CONSTRUCTION OF THIS NOTE.   [name]:[id number] DOB: 1946/03/19 DOA: 05/10/2021  PCP: [name], MD  Patient coming from: home   I have personally briefly reviewed patient's old medical records in [place]  Chief Complaint: Left chest wall erythema  HPI: Kathleen [name] is a 75 y.o. 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   While acknowledging a history of intertriginous rash on the rest, the patient reports 3 to 4 days of new onset erythema associated with the anterior aspect of the left chest wall.  She notes this is associated with increased tenderness, increased warmth to touch.  She reports intermittent serous drainage in the absence of any purulent appearing drainage.  This is been associated with subjective fever in the absence of chills, full body rigors, or generalized myalgias.  Denies new rash in any other location.  She acknowledges left-sided pacemaker, and states that this was placed in 2019.  Denies any recent trauma.  She also reports new onset intermittent nausea for the last few days, in the absence of any associated vomiting.  However, she reports consequential significant decline in oral intake over the last few days.  Denies any abdominal pain, diarrhea, melena, or hematochezia. She denies shortness of breath, diaphoresis, palpitations, dizziness, presyncope, or syncope.No recent headache, neck stiffness,  dysuria, gross hematuria.   Chart reviewed, including most recent cardiology documentation from 02/13/2021, the patient has a history of previous depressed systolic function, with prior EF noted to be 40%, while noting subsequent improvement in EF, with most recent echocardiogram in August 2022 showing LVEF greater than 55%.  This cardiology documentation from September 2022 also notes the presence of chronic diastolic heart failure associated with [place] heart classification 3.  Her outpatient diuretic regimen includes spironolactone as well as torsemide taken twice per week.     [facility] ED Course:  Vital signs in the ED were notable for the following: Temperature max 100.0; initial heart rate 99, decreasing subsequently to 73 following interval administration of IV fluids; initial blood pressure 94/65, which subsequently increased to 158/75 following interval IV fluids; respiratory rate 16-23, oxygen saturation 96-100 point baseline 4 L continuous nasal cannula.  Labs were notable for the following: CMP notable for the following: Sodium 136, bicarbonate 25, creatinine 0.64 relative to most recent prior creatinine data point of 0.71 on 04/22/2021, with BUN to creatinine ratio 20.3.  Liver enzymes within normal limits.  Lactic acid 1.8.  CBC notable for white blood cell count 13,000 with 84% neutrophils.  Urinalysis showed 6-10 white blood cells and nitrate negative.  COVID-19/influenza PCR were checked in the ED today and found to be negative.  Blood cultures x2 collected prior to initiation of IV antibiotics.   Imaging and additional notable ED work-up: Chest x-ray showed evidence of left-sided pacemaker, and showed bibasilar atelectasis, suspected small bilateral pleural effusions, in the absence of any evidence of infiltrate, edema, or pneumothorax.  While in the ED, the following were administered: Rocephin, lactated Ringer's x500 cc  bolus.  Socially, the patient was transferred to [facility]  for observation to the med telemetry unit for further evaluation management of sepsis due to cellulitis of the left chest wall.      Review of Systems: As per HPI otherwise 10 point review of systems negative.   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[place])    CHF (congestive heart failure) (HCC)    Chronic renal insufficiency    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    COPD (chronic obstructive pulmonary disease) (HCC)    Gout    Hypertension    Longstanding persistent atrial fibrillation (HCC)    Nonischemic cardiomyopathy ([place])    Obesity    Pacemaker    Spontaneous pneumothorax 2013    Past Surgical History:  Procedure Laterality Date   ABDOMINAL HYSTERECTOMY     ATRIAL FIBRILLATION ABLATION  07/20/2013   by Dr Westley Gambles   AV nodal ablation  11/01/2013   by Dr Westley Gambles, repeated by Dr Dwana Curd   BREAST BIOPSY Bilateral 1997   negative   CARDIAC CATHETERIZATION     CHOLECYSTECTOMY     HERNIA REPAIR     PACEMAKER INSERTION  06/2017   MDT Viva CRT-P implanted by Dr Westley Gambles after AV nodal ablation,  LV lead could not be placed    Social History:  reports that she quit smoking about 33 years ago. Her smoking use included cigarettes. She has never used smokeless tobacco. She reports that she does not drink alcohol and does not use drugs.   Allergies  Allergen Reactions   Allopurinol Other (See Comments)    Reaction:  Dizziness    Clindamycin Anaphylaxis and Hives   Pneumococcal 13-Val Conj Vacc Itching, Swelling and Rash   Dronedarone Rash   Entresto [Sacubitril-Valsartan] Other (See Comments)    hypotension   Fosamax [Alendronate Sodium] Nausea Only   Meperidine Nausea And Vomiting   Rosuvastatin Other (See Comments)    Reaction:  Muscle spasms    Tetracycline Hives   Adhesive [Tape] Rash   Lovastatin Rash    Other reaction(s): Muscle Pain    Family  History  Problem Relation Age of Onset   Breast cancer Mother 30       3 different times   Breast cancer Cousin     Family history reviewed and not pertinent    Prior to Admission medications   Medication Sig Start Date End Date Taking? Authorizing Provider  albuterol (PROVENTIL HFA;VENTOLIN HFA) 108 (90 Base) MCG/ACT inhaler Inhale 2 puffs into the lungs every 4 (four) hours as needed for wheezing or shortness of breath.    [provider]  arformoterol (BROVANA) 15 MCG/2ML NEBU Take 2 mLs (15 mcg total) by nebulization 2 (two) times daily. 03/07/21 06/14/21  Freddi Starr, MD  azelastine (ASTELIN) 0.1 % nasal spray Place 2 sprays into both nostrils 2 (two) times daily. 09/06/20   [provider]  budesonide (PULMICORT) 0.25 MG/2ML nebulizer solution Take 2 mLs (0.25 mg total) by nebulization 2 (two) times daily. Patient taking differently: Take 0.25 mg by nebulization 2 (two) times daily as needed (wheezing/shortness of breath). 04/04/21   Freddi Starr, MD  Cholecalciferol (VITAMIN D3) 50 MCG (2000 UT) TABS Take 2,000 Units by mouth daily.    [provider]  cyanocobalamin (,VITAMIN B-12,) 1000 MCG/ML injection Inject 1 mL (1,000 mcg total) into  the muscle every 30 (thirty) days. 04/01/21   [name], MD  docusate sodium (COLACE) 100 MG capsule Take 100 mg by mouth 2 (two) times daily as needed for mild constipation.    [provider]  dofetilide (TIKOSYN) 250 MCG capsule Take 250 mcg by mouth in the morning and at bedtime. 08/17/20 08/17/21  [provider]  ezetimibe (ZETIA) 10 MG tablet Take 10 mg by mouth at bedtime. 10/01/20   [provider]  febuxostat (ULORIC) 40 MG tablet Take 40 mg by mouth at bedtime. 05/01/19   [provider]  ipratropium-albuterol (DUONEB) 0.5-2.5 (3) MG/3ML SOLN Inhale 3 mLs into the lungs every 6 (six) hours as needed (wheezing/shortness of breath).    [provider]   ketoconazole (NIZORAL) 2 % cream Apply 1 application topically 2 (two) times daily. 02/21/21   [name], MD  levothyroxine (SYNTHROID) 50 MCG tablet Take 50 mcg by mouth daily before breakfast.    [provider]  linaclotide Rolan Lipa) 145 MCG CAPS capsule Take 1 capsule (145 mcg total) by mouth daily before breakfast. 04/22/21   [name], MD  losartan (COZAAR) 25 MG tablet Take 1 tablet (25 mg total) by mouth at bedtime. 02/13/21   Bensimhon, Shaune Pascal, MD  Magnesium 500 MG TABS Take 500 mg by mouth every evening.    [provider]  ondansetron (ZOFRAN) 4 MG tablet Take 1 tablet (4 mg total) by mouth every 8 (eight) hours as needed for nausea or vomiting. 04/15/21   Marcello Fennel, PA-C  rivaroxaban (XARELTO) 20 MG TABS tablet Take 20 mg by mouth daily with supper.     [provider]  Semaglutide (RYBELSUS) 3 MG TABS Take 3 mg by mouth daily. 04/23/21   [name], MD  spironolactone (ALDACTONE) 25 MG tablet Take 25 mg by mouth daily. 11/03/20   [provider]  torsemide (DEMADEX) 20 MG tablet Take 1 tablet (20 mg total) by mouth 2 (two) times a week. Take extra tab for weight 222 lbs or greater Patient taking differently: Take 20 mg by mouth 2 (two) times a week. Take extra tab for weight 2 lbs or greater 02/13/21   Bensimhon, Shaune Pascal, MD  traMADol (ULTRAM) 50 MG tablet Take 50 mg by mouth every 6 (six) hours as needed for moderate pain.    [provider]  YUPELRI 175 MCG/3ML nebulizer solution USE 1 VIAL IN NEBULIZER DAILY - DO NOT MIX WITH OTHER NEB MEDS,USE BEFORE OR AFTER Patient taking differently: Take 175 mcg by nebulization daily. 04/02/21   Freddi Starr, MD     Objective    Physical Exam: Vitals:   05/10/21 1815 05/10/21 1936 05/10/21 2000 05/10/21 2109  BP: 99/63 104/75 (!) 113/45   Pulse: 71 73 73   Resp: (!) '22 20 11   ' Temp:  99.7 F (37.6 C)    TempSrc:      SpO2: 100% 100% 100%   Weight:    100 kg   Height:    5' (1.524 m)    General: appears to be stated age; alert, oriented Skin: warm, dry, erythema associated with the anterior aspect of the left chest wall, with evidence of some serous drainage in the absence of purulence; increased warmth;  Head:  AT/[place] Mouth:  Oral mucosa membranes appear dry, normal dentition Neck: supple; trachea midline Heart:  RRR; did not appreciate any M/R/G Lungs: CTAB, did not appreciate any wheezes, rales, or rhonchi  Abdomen: + BS; soft, ND, NT Vascular: 2+ pedal pulses b/l; 2+ radial pulses b/l Extremities: no peripheral edema, no muscle wasting Neuro: strength and sensation intact in upper and lower extremities b/l    Labs on Admission: I have personally reviewed following labs and imaging studies  CBC: Recent Labs  Lab 05/10/21 1224  WBC 13.0*  NEUTROABS 11.0*  HGB 12.2  HCT 39.3  MCV 81.9  PLT 435   Basic Metabolic Panel: Recent Labs  Lab 05/10/21 1224  NA 136  K 4.0  CL 101  CO2 25  GLUCOSE 132*  BUN 13  CREATININE 0.64  CALCIUM 10.0   GFR: Estimated Creatinine Clearance: 65.5 mL/min (by C-G formula based on SCr of 0.64 mg/dL). Liver Function Tests: Recent Labs  Lab 05/10/21 1224  AST 9*  ALT 6  ALKPHOS 46  BILITOT 0.8  PROT 6.6  ALBUMIN 4.1   No results for input(s): LIPASE, AMYLASE in the last 168 hours. No results for input(s): AMMONIA in the last 168 hours. Coagulation Profile: Recent Labs  Lab 05/10/21 1224  INR 1.4*   Cardiac Enzymes: No results for input(s): CKTOTAL, CKMB, CKMBINDEX, TROPONINI in the last 168 hours. BNP (last 3 results) No results for input(s): PROBNP in the last 8760 hours. HbA1C: No results for input(s): HGBA1C in the last 72 hours. CBG: No results for input(s): GLUCAP in the last 168 hours. Lipid Profile: No results for input(s): CHOL, HDL, LDLCALC, TRIG, CHOLHDL, LDLDIRECT in the last 72 hours. Thyroid Function Tests: No results for input(s): TSH, T4TOTAL, FREET4, T3FREE,  THYROIDAB in the last 72 hours. Anemia Panel: No results for input(s): VITAMINB12, FOLATE, FERRITIN, TIBC, IRON, RETICCTPCT in the last 72 hours. Urine analysis:    Component Value Date/Time   COLORURINE YELLOW 05/10/2021 1720   APPEARANCEUR CLEAR 05/10/2021 1720   APPEARANCEUR Hazy 12/25/2012 1653   LABSPEC 1.012 05/10/2021 1720   LABSPEC 1.021 12/25/2012 1653   PHURINE 5.5 05/10/2021 1720   GLUCOSEU NEGATIVE 05/10/2021 1720   GLUCOSEU Negative 12/25/2012 1653   HGBUR NEGATIVE 05/10/2021 1720   BILIRUBINUR NEGATIVE 05/10/2021 1720   BILIRUBINUR Negative 12/25/2012 1653   KETONESUR NEGATIVE 05/10/2021 1720   PROTEINUR NEGATIVE 05/10/2021 1720   NITRITE NEGATIVE 05/10/2021 1720   LEUKOCYTESUR MODERATE (A) 05/10/2021 1720   LEUKOCYTESUR Negative 12/25/2012 1653    Radiological Exams on Admission: DG Chest Port 1 View  Result Date: 05/10/2021 CLINICAL DATA:  Evaluate for sepsis EXAM: PORTABLE CHEST 1 VIEW COMPARISON:  None. FINDINGS: LEFT-sided pacemaker overlies stable enlarged cardiac silhouette. There is bibasilar atelectasis and probable small effusions. No pneumothorax. No pulmonary edema. IMPRESSION: Cardiomegaly, bibasilar atelectasis and bilateral pleural effusions. Electronically Signed   By: Suzy Bouchard M.D.   On: 05/10/2021 13:11      Assessment/Plan   Principal Problem:   Cellulitis of chest wall Active Problems:   HTN (hypertension)   Atrial fibrillation (HCC)   (HFpEF) heart failure with preserved ejection fraction (HCC)   Sepsis (HCC)   Leukocytosis   Dehydration   Acute prerenal azotemia     #) Sepsis due to cellulitis of the left chest wall: Diagnosis suspected on the basis of 3 to 4 days of new onset left chest wall erythema associated with tenderness, increased warmth to touch, and serous drainage.  SIRS criteria met via presenting leukocytosis and tachypnea.  Of note, in the absence of any evidence of end organ damage, including a non-elevated  presenting LA of 1.8, pt's sepsis does not meet criteria to  be considered severe in nature. Also, in the absence of lactic acid level that is greater than or equal to 4.0, and in the absence of any associated hypotension refractory to IVF's, there are no indications for administration of a 30 mL/kg IVF bolus at this time.  In the absence of any associated purulent drainage, MRSA is felt to be less likely.  Blood cultures x2 collected at [facility] ED followed by initiation of Rocephin, which will be continued.   No e/o additional underlying infectious process at this time, including urinalysis that was inconsistent with UTI, COVID-19/influenza negative, and chest x-ray showing no evidence of infiltrate to suggest pneumonia.     Plan: CBC w/ diff in AM.  Follow-up for results of blood cultures x2.  Rocephin.  Prn acetaminophen.  Continue as needed tramadol.      #) Dehydration: Clinical evidence to suggest presenting dehydration, including physical exam and laboratory findings consistent with this.  Specifically, evidence of dry oral mucosal membranes on exam, as well as labs consistent with acute prerenal azotemia.  This appears to be on the basis of the patient's report of significant decline in oral intake over the last 3 to 4 days in the setting of intermittent nausea.  Not associated with AKI at this time.  Will initiate gentle continuous IV fluids overnight, as further detailed below, we will closely monitoring for ensuing evidence for development of acute volume overload given the patient's history of chronic diastolic heart failure with [place] heart classification 3.  Plan: Lactated Ringer's at 50 cc/h x 8 hours.  Monitor strict I's and O's and daily weights.  Repeat BMP in the morning.       #) Persistent atrial fibrillation: Documented history of such, complicated by history of sick sinus syndrome/complete heart block prompting pacemaker placement 2019. In the setting of a  CHA2DS2-VASc score of 5, there is an indication for the patient to be on chronic anticoagulation for thromboembolic prophylaxis. Consistent with this, the patient is chronically anticoagulated on Xarelto. Home AV nodal blocking regimen: None.  On Tikosyn as outpatient. most recent echocardiogram was reportedly performed in August 2022, with results as further detailed above.   Plan: monitor strict I's & O's and daily weights. Repeat BMP and CBC in the morning. Check serum magnesium level. Continue home Xarelto and Tikosyn.         #) Chronic diastolic heart failure: documented history of such review of most recent cardiology documentation from September 2022, with most recent echocardiogram performed August 2022, with notable associated findings including LVEF greater than 55% along with evidence of diastolic dysfunction consistent with your heart classification 3. No clinical evidence to suggest acutely decompensated heart failure at this time. home diuretic regimen reportedly consists of the following: Spironolactone as well as torsemide, with the latter taken twice per week.  Hold home diuretics for now in the setting of clinical evidence of presenting mild dehydration, as above.    Plan: monitor strict I's & O's and daily weights. Repeat BMP in AM. Check serum mag level.  Hold home diuretic regimen for now.        #) Chronic hypoxic respiratory failure: Documented history of such, with reported baseline supplemental oxygen requirement of continuous 2 to 4 L nasal cannula.  This is in the setting of history of severe COPD as well as documentation of BOOP, for which the patient follows with Dr. As her outpatient pulmonologist.  At this time, she is maintaining oxygen saturations in the high  90s on her baseline supplemental oxygen.  No evidence to suggest acute COPD exacerbation at this time.  Outpatient respiratory regimen includes Brovana and prn albuterol.   Plan: Continue home respiratory  regimen.  Add on serum magnesium level.      #) Essential Hypertension: documented history of such, with outpatient antihypertensive regimen including losartan.  Systolic blood pressures pressure in the ED today initially in the 90s, with ensuing improvement at into normotensive values following interval IV fluids.  In the context of this initial soft blood pressure as well as presenting sepsis, will hold home losartan for now.   Plan: Close monitoring of subsequent blood pressure via routine vital signs.  Hold home losartan for now, as above.       #) acquired hypothyroidism: documented h/o such, on Synthroid as outpatient.   Plan: cont home Synthroid.       DVT prophylaxis: ; continue home Xarelto Code Status: Full code Family Communication: none Disposition Plan: Per Rounding Team Consults called: none;  Admission status: obs; med telemetry   PLEASE NOTE THAT DRAGON DICTATION SOFTWARE WAS USED IN THE CONSTRUCTION OF THIS NOTE.   [place] DO [facility]  From [place]   05/10/2021, 9:29 PM

## 2021-05-10 NOTE — Treatment Plan (Signed)
75 yo with hx COPD, s/p pacemaker placement, persistent Kathleen [name] fib, chronic renal insufficiency, HF, colitis obliterans, NICM who presented with pain, rash and fever to L breast.  Chronic intertriginous rash of L breast, intermittent fevers at home.  Concern for worsening rash, developing/worsening cellulitis.  Started on abx with ceftriaxone.  Labs notable for leukocytosis.  Vitals for tachypnea.  She's on 4 L O2, but has chronic O2 dependence and reportedly increased it recently.  CXR with bilateral effusions.  Plan to admit for cellulitis.  Has ziopatch recently placed with cards, notably.  Admit to [facility], tele.

## 2021-05-10 NOTE — ED Notes (Signed)
Pt reports pain in right chest with exertion. Pt states that it started to hurt when she pushed her self to get out of the the bed. Pt stated that pain was a 7 when she attempted to get out of bed but states that it has gone down from a 7 to a 5. Pt states that she is fine when she is laying still.

## 2021-05-10 NOTE — ED Triage Notes (Signed)
Patient here POV from Home with Skin Infection  Patient had Operation at [facility] in December of 2021 and since she has been having Intermittent Skin Infection (Yeast possibly). Patient was given Nystatin.  Since then she has been having these infections intermittently. Patient here today due to worsening Rash around Breast (Mainly Left).   Patient uncomfortable in Triage. A&Ox4. GCS 15. Ambulatory with Dan Humphreys.

## 2021-05-10 NOTE — ED Notes (Signed)
Care Handoff/Report given to [place], [place]. All Questions Answered at this Time.

## 2021-05-10 NOTE — ED Notes (Signed)
ED Provider at bedside. 

## 2021-05-10 NOTE — ED Provider Notes (Signed)
[facility] EMERGENCY DEPT Provider Note   CSN: [id number] Arrival date & time: 05/10/21  1103     History Chief Complaint  Patient presents with   Rash    Kathleen [name] is a 76 y.o. female.  HPI 75 year old female history of COPD, status post pacemaker, persistent atrial fibrillation, obesity, chronic renal insufficiency, CHF, colitis obliterans, nonischemic cardiomyopathy, hypertension presents today complaining of pain, rash, and fever.  She describes having an intertriginous infection chronically for several months.  However, several days ago she began having spreading erythema and rash with associated fever on her left breast.  This is painful.  She had a fever of 100 degrees here.  She reports fever at home but is unclear exactly how high it has been.  She is oxygen dependent and has required increasing oxygen to 4 L.  She has a Biopatch in place since November 30.  It is just above the left breast.  Also has a pacemaker on the left side.  She reports no tenderness or pain around the pacemaker site.  She has had nausea and has had decreased p.o. intake secondary to this.  She reports an 11 pound weight loss without any increase in diuretics.    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[place])    CHF (congestive heart failure) ([place])    Chronic renal insufficiency    Complete heart block ([place]) s/p AV nodal ablation    COPD (chronic obstructive pulmonary disease) ([place])    Gout    Hypertension    Longstanding persistent atrial fibrillation ([place])    Nonischemic cardiomyopathy ([place])    Obesity    Pacemaker    Spontaneous pneumothorax 2013    Patient Active Problem List   Diagnosis Date Noted   Cellulitis of breast 05/10/2021   Pneumonia of left lower lobe due to infectious organism 04/22/2021   Age-related osteoporosis without current pathological fracture  04/22/2021   Type 2 diabetes mellitus with diabetic neuropathy, without long-term current use of insulin ([place]) 04/22/2021   Chronic idiopathic constipation 04/22/2021   Flu vaccine need 04/22/2021   Daytime somnolence 01/22/2021   Sleep apnea 01/22/2021   Constipation    (HFpEF) heart failure with preserved ejection fraction ([place]) [id number]   Chronic systolic heart failure ([place]) 04/06/2018   HTN (hypertension) 04/06/2018   COPD (chronic obstructive pulmonary disease) ([place]) 04/06/2018   Atrial fibrillation ([place]) 04/06/2018   Malnutrition of moderate degree 09/11/2015    Past Surgical History:  Procedure Laterality Date   ABDOMINAL HYSTERECTOMY     ATRIAL FIBRILLATION ABLATION  07/20/2013   by Dr Westley Gambles   AV nodal ablation  11/01/2013   by Dr Westley Gambles, repeated by Dr Dwana Curd   BREAST BIOPSY Bilateral 1997   negative   CARDIAC CATHETERIZATION     CHOLECYSTECTOMY     HERNIA REPAIR     PACEMAKER INSERTION  06/2017   MDT Viva CRT-P implanted by Dr Westley Gambles after AV nodal ablation,  LV lead could not be placed     OB History   No obstetric history on file.     Family History  Problem Relation Age of Onset   Breast cancer Mother 79       3 different times   Breast cancer Cousin     Social History   Tobacco Use   Smoking status: Former    Types: Cigarettes  Quit date: 06/09/1987    Years since quitting: 33.9   Smokeless tobacco: Never  Vaping Use   Vaping Use: Never used  Substance Use Topics   Alcohol use: No    Alcohol/week: 0.0 standard drinks   Drug use: No    Home Medications Prior to Admission medications   Medication Sig Start Date End Date Taking? Authorizing Provider  albuterol (PROVENTIL HFA;VENTOLIN HFA) 108 (90 Base) MCG/ACT inhaler Inhale 2 puffs into the lungs every 4 (four) hours as needed for wheezing or shortness of breath.    [provider]  arformoterol (BROVANA) 15 MCG/2ML NEBU Take 2 mLs (15 mcg total) by nebulization 2 (two) times  daily. 03/07/21 06/14/21  Martina Sinner, MD  azelastine (ASTELIN) 0.1 % nasal spray Place 2 sprays into both nostrils 2 (two) times daily. 09/06/20   [provider]  budesonide (PULMICORT) 0.25 MG/2ML nebulizer solution Take 2 mLs (0.25 mg total) by nebulization 2 (two) times daily. Patient taking differently: Take 0.25 mg by nebulization 2 (two) times daily as needed (wheezing/shortness of breath). 04/04/21   Martina Sinner, MD  Cholecalciferol (VITAMIN D3) 50 MCG (2000 UT) TABS Take 2,000 Units by mouth daily.    [provider]  cyanocobalamin (,VITAMIN B-12,) 1000 MCG/ML injection Inject 1 mL (1,000 mcg total) into the muscle every 30 (thirty) days. 04/01/21   [name], MD  docusate sodium (COLACE) 100 MG capsule Take 100 mg by mouth 2 (two) times daily as needed for mild constipation.    [provider]  dofetilide (TIKOSYN) 250 MCG capsule Take 250 mcg by mouth in the morning and at bedtime. 08/17/20 08/17/21  [provider]  ezetimibe (ZETIA) 10 MG tablet Take 10 mg by mouth at bedtime. 10/01/20   [provider]  febuxostat (ULORIC) 40 MG tablet Take 40 mg by mouth at bedtime. 05/01/19   [provider]  ipratropium-albuterol (DUONEB) 0.5-2.5 (3) MG/3ML SOLN Inhale 3 mLs into the lungs every 6 (six) hours as needed (wheezing/shortness of breath).    [provider]  ketoconazole (NIZORAL) 2 % cream Apply 1 application topically 2 (two) times daily. 02/21/21   [name], MD  levothyroxine (SYNTHROID) 50 MCG tablet Take 50 mcg by mouth daily before breakfast.    [provider]  linaclotide Karlene Einstein) 145 MCG CAPS capsule Take 1 capsule (145 mcg total) by mouth daily before breakfast. 04/22/21   [name], MD  losartan (COZAAR) 25 MG tablet Take 1 tablet (25 mg total) by mouth at bedtime. 02/13/21   Bensimhon, [name], MD  Magnesium 500 MG TABS Take 500 mg by mouth every evening.    [provider]  ondansetron (ZOFRAN) 4 MG tablet Take 1 tablet (4 mg total) by mouth every 8 (eight) hours as needed for nausea or vomiting. 04/15/21   Carroll Sage, PA-C  rivaroxaban (XARELTO) 20 MG TABS tablet Take 20 mg by mouth daily with supper.     [provider]  Semaglutide (RYBELSUS) 3 MG TABS Take 3 mg by mouth daily. 04/23/21   [name], MD  spironolactone (ALDACTONE) 25 MG tablet Take 25 mg by mouth daily. 11/03/20   [provider]  torsemide (DEMADEX) 20 MG tablet Take 1 tablet (20 mg total) by mouth 2 (two) times a week. Take extra tab for weight 222 lbs or greater Patient taking differently: Take 20 mg by mouth 2 (two) times a week. Take extra tab for weight 2  lbs or greater 02/13/21   Bensimhon, Shaune Pascal, MD  traMADol (ULTRAM) 50 MG tablet Take 50 mg by mouth every 6 (six) hours as needed for moderate pain.    [provider]  YUPELRI 175 MCG/3ML nebulizer solution USE 1 VIAL IN NEBULIZER DAILY - DO NOT MIX WITH OTHER NEB MEDS,USE BEFORE OR AFTER Patient taking differently: Take 175 mcg by nebulization daily. 04/02/21   Freddi Starr, MD    Allergies    Allopurinol, Clindamycin, Pneumococcal 13-val conj vacc, Dronedarone, Entresto [sacubitril-valsartan], Fosamax [alendronate sodium], Meperidine, Rosuvastatin, Tetracycline, Adhesive [tape], and Lovastatin  Review of Systems   Review of Systems  All other systems reviewed and are negative.  Physical Exam Updated Vital Signs BP (!) 115/51   Pulse 70   Temp 100 F (37.8 C) (Oral)   Resp (!) 21   Ht 1.575 m (5\' 2" )   Wt 100.5 kg   SpO2 100%   BMI 40.52 kg/m   Physical Exam Vitals and nursing note reviewed.  Constitutional:      General: She is not in acute distress.    Appearance: Normal appearance. She is obese. She is ill-appearing.  HENT:     Head: Normocephalic.     Right Ear: External ear normal.     Nose: Nose normal.     Mouth/Throat:     Pharynx: Oropharynx is clear.  Eyes:      Pupils: Pupils are equal, round, and reactive to light.  Cardiovascular:     Rate and Rhythm: Normal rate. Rhythm irregular.  Pulmonary:     Effort: Pulmonary effort is normal.     Breath sounds: Normal breath sounds.  Abdominal:     General: Abdomen is flat.     Palpations: Abdomen is soft.  Musculoskeletal:        General: Normal range of motion.     Cervical back: Normal range of motion.  Skin:    General: Skin is warm and dry.     Comments: Erythema from below the left breast where there is a beefy redness that spreads up across her breast on the left with well delineated margins, warmth, but no evidence of abscess  Neurological:     General: No focal deficit present.     Mental Status: She is alert.  Psychiatric:        Mood and Affect: Mood normal.        Behavior: Behavior normal.    ED Results / Procedures / Treatments   Labs (all labs ordered are listed, but only abnormal results are displayed) Labs Reviewed  COMPREHENSIVE METABOLIC PANEL - Abnormal; Notable for the following components:      Result Value   Glucose, Bld 132 (*)    AST 9 (*)    All other components within normal limits  CBC WITH DIFFERENTIAL/PLATELET - Abnormal; Notable for the following components:   WBC 13.0 (*)    MCH 25.4 (*)    Neutro Abs 11.0 (*)    All other components within normal limits  PROTIME-INR - Abnormal; Notable for the following components:   Prothrombin Time 17.5 (*)    INR 1.4 (*)    All other components within normal limits  CULTURE, BLOOD (ROUTINE X 2)  CULTURE, BLOOD (ROUTINE X 2)  URINE CULTURE  RESP PANEL BY RT-PCR (FLU A&B, COVID) [id number]  LACTIC ACID, PLASMA  APTT  LACTIC ACID, PLASMA  URINALYSIS, ROUTINE W REFLEX MICROSCOPIC    EKG EKG Interpretation  Date/Time:  Saturday May 10 2021 12:34:29 EST Ventricular Rate:  79 PR Interval:  228 QRS Duration: 125 QT Interval:  398 QTC Calculation: 457 R Axis:   -76 Text Interpretation: Atrial-sensed  ventricular-paced complexes No further analysis attempted due to paced rhythm Confirmed by Pattricia Boss [phone]) on 05/10/2021 2:40:11 PM  Radiology DG Chest Port 1 View  Result Date: 05/10/2021 CLINICAL DATA:  Evaluate for sepsis EXAM: PORTABLE CHEST 1 VIEW COMPARISON:  None. FINDINGS: LEFT-sided pacemaker overlies stable enlarged cardiac silhouette. There is bibasilar atelectasis and probable small effusions. No pneumothorax. No pulmonary edema. IMPRESSION: Cardiomegaly, bibasilar atelectasis and bilateral pleural effusions. Electronically Signed   By: Suzy Bouchard M.D.   On: 05/10/2021 13:11    Procedures Procedures   Medications Ordered in ED Medications  0.9 %  sodium chloride infusion (0 mLs Intravenous Stopped 05/10/21 1335)  lactated ringers bolus 500 mL (500 mLs Intravenous New Bag/Given 05/10/21 1334)  cefTRIAXone (ROCEPHIN) 2 g in sodium chloride 0.9 % 100 mL IVPB (0 g Intravenous Stopped 05/10/21 1334)    ED Course  I have reviewed the triage vital signs and the nursing notes.  Pertinent labs & imaging results that were available during my care of the patient were reviewed by me and considered in my medical decision making (see chart for details).  Clinical Course as of 05/10/21 1459  Sat May 10, 2021  1456 Discussed care with Dr. Florene Glen.  He is excepted the patient to his service at [facility]. [DR]    Clinical Course User Index [DR] Pattricia Boss, MD   MDM Rules/Calculators/A&P                         Undifferentiated sepsis order set initiated.  Rocephin ordered IV.  Given patient's CHF and normotensive at this time, will give 500 cc bolus only. Patient presents today with cellulitis.  She is febrile, has leukocytosis.  She does have a Zio patch in place that is somewhat here to this will likely need to be moved but is not currently connected to the cellulitis. Plan admission for further antibiotics and would advised that cardiology be consulted to remove Zio  patch. Final Clinical Impression(s) / ED Diagnoses Final diagnoses:  Cellulitis of chest wall    Rx / DC Orders ED Discharge Orders     None        Pattricia Boss, MD 05/10/21 1459

## 2021-05-11 DIAGNOSIS — L304 Erythema intertrigo: Secondary | ICD-10-CM | POA: Diagnosis present

## 2021-05-11 DIAGNOSIS — A419 Sepsis, unspecified organism: Secondary | ICD-10-CM | POA: Diagnosis present

## 2021-05-11 DIAGNOSIS — Z6841 Body Mass Index (BMI) 40.0 and over, adult: Secondary | ICD-10-CM | POA: Diagnosis not present

## 2021-05-11 DIAGNOSIS — I10 Essential (primary) hypertension: Secondary | ICD-10-CM | POA: Diagnosis not present

## 2021-05-11 DIAGNOSIS — I428 Other cardiomyopathies: Secondary | ICD-10-CM | POA: Diagnosis present

## 2021-05-11 DIAGNOSIS — L03313 Cellulitis of chest wall: Secondary | ICD-10-CM | POA: Diagnosis present

## 2021-05-11 DIAGNOSIS — I5032 Chronic diastolic (congestive) heart failure: Secondary | ICD-10-CM | POA: Diagnosis present

## 2021-05-11 DIAGNOSIS — I4811 Longstanding persistent atrial fibrillation: Secondary | ICD-10-CM | POA: Diagnosis present

## 2021-05-11 DIAGNOSIS — E039 Hypothyroidism, unspecified: Secondary | ICD-10-CM | POA: Diagnosis present

## 2021-05-11 DIAGNOSIS — B372 Candidiasis of skin and nail: Secondary | ICD-10-CM | POA: Diagnosis present

## 2021-05-11 DIAGNOSIS — I495 Sick sinus syndrome: Secondary | ICD-10-CM | POA: Diagnosis present

## 2021-05-11 DIAGNOSIS — Z79899 Other long term (current) drug therapy: Secondary | ICD-10-CM | POA: Diagnosis not present

## 2021-05-11 DIAGNOSIS — E860 Dehydration: Secondary | ICD-10-CM | POA: Diagnosis present

## 2021-05-11 DIAGNOSIS — Z87891 Personal history of nicotine dependence: Secondary | ICD-10-CM | POA: Diagnosis not present

## 2021-05-11 DIAGNOSIS — J449 Chronic obstructive pulmonary disease, unspecified: Secondary | ICD-10-CM | POA: Diagnosis present

## 2021-05-11 DIAGNOSIS — Z20822 Contact with and (suspected) exposure to covid-19: Secondary | ICD-10-CM | POA: Diagnosis present

## 2021-05-11 DIAGNOSIS — J8489 Other specified interstitial pulmonary diseases: Secondary | ICD-10-CM | POA: Diagnosis present

## 2021-05-11 DIAGNOSIS — D72829 Elevated white blood cell count, unspecified: Secondary | ICD-10-CM | POA: Diagnosis not present

## 2021-05-11 DIAGNOSIS — I4819 Other persistent atrial fibrillation: Secondary | ICD-10-CM | POA: Diagnosis not present

## 2021-05-11 DIAGNOSIS — I442 Atrioventricular block, complete: Secondary | ICD-10-CM | POA: Diagnosis present

## 2021-05-11 DIAGNOSIS — J9611 Chronic respiratory failure with hypoxia: Secondary | ICD-10-CM | POA: Diagnosis present

## 2021-05-11 DIAGNOSIS — E669 Obesity, unspecified: Secondary | ICD-10-CM | POA: Diagnosis present

## 2021-05-11 DIAGNOSIS — L03314 Cellulitis of groin: Secondary | ICD-10-CM | POA: Diagnosis present

## 2021-05-11 DIAGNOSIS — M109 Gout, unspecified: Secondary | ICD-10-CM | POA: Diagnosis present

## 2021-05-11 DIAGNOSIS — I480 Paroxysmal atrial fibrillation: Secondary | ICD-10-CM | POA: Diagnosis not present

## 2021-05-11 DIAGNOSIS — N19 Unspecified kidney failure: Secondary | ICD-10-CM | POA: Diagnosis not present

## 2021-05-11 DIAGNOSIS — I130 Hypertensive heart and chronic kidney disease with heart failure and stage 1 through stage 4 chronic kidney disease, or unspecified chronic kidney disease: Secondary | ICD-10-CM | POA: Diagnosis present

## 2021-05-11 DIAGNOSIS — Z803 Family history of malignant neoplasm of breast: Secondary | ICD-10-CM | POA: Diagnosis not present

## 2021-05-11 DIAGNOSIS — Z950 Presence of cardiac pacemaker: Secondary | ICD-10-CM | POA: Diagnosis not present

## 2021-05-11 LAB — COMPREHENSIVE METABOLIC PANEL
ALT: 8 U/L (ref 0–44)
AST: 11 U/L — ABNORMAL LOW (ref 15–41)
Albumin: 2.9 g/dL — ABNORMAL LOW (ref 3.5–5.0)
Alkaline Phosphatase: 48 U/L (ref 38–126)
Anion gap: 10 (ref 5–15)
BUN: 9 mg/dL (ref 8–23)
CO2: 23 mmol/L (ref 22–32)
Calcium: 8.6 mg/dL — ABNORMAL LOW (ref 8.9–10.3)
Chloride: 104 mmol/L (ref 98–111)
Creatinine, Ser: 0.76 mg/dL (ref 0.44–1.00)
GFR, Estimated: >60 mL/min (ref >60)
Glucose, Bld: 107 mg/dL — ABNORMAL HIGH (ref 70–99)
Potassium: 3.9 mmol/L (ref 3.5–5.1)
Sodium: 137 mmol/L (ref 135–145)
Total Bilirubin: 0.6 mg/dL (ref 0.3–1.2)
Total Protein: 5.6 g/dL — ABNORMAL LOW (ref 6.5–8.1)

## 2021-05-11 LAB — CBC WITH DIFFERENTIAL/PLATELET
Abs Immature Granulocytes: 0.06 10*3/uL (ref 0.00–0.07)
Basophils Absolute: 0.1 10*3/uL (ref 0.0–0.1)
Basophils Relative: 1 %
Eosinophils Absolute: 0.2 10*3/uL (ref 0.0–0.5)
Eosinophils Relative: 3 %
HCT: 36.8 % (ref 36.0–46.0)
Hemoglobin: 11.5 g/dL — ABNORMAL LOW (ref 12.0–15.0)
Immature Granulocytes: 1 %
Lymphocytes Relative: 13 %
Lymphs Abs: 0.8 10*3/uL (ref 0.7–4.0)
MCH: 26.1 pg (ref 26.0–34.0)
MCHC: 31.3 g/dL (ref 30.0–36.0)
MCV: 83.4 fL (ref 80.0–100.0)
Monocytes Absolute: 0.7 10*3/uL (ref 0.1–1.0)
Monocytes Relative: 10 %
Neutro Abs: 4.8 10*3/uL (ref 1.7–7.7)
Neutrophils Relative %: 72 %
Platelets: 190 10*3/uL (ref 150–400)
RBC: 4.41 MIL/uL (ref 3.87–5.11)
RDW: 14.6 % (ref 11.5–15.5)
WBC: 6.7 10*3/uL (ref 4.0–10.5)
nRBC: 0 % (ref 0.0–0.2)

## 2021-05-11 LAB — MAGNESIUM: Magnesium: 1.9 mg/dL (ref 1.7–2.4)

## 2021-05-11 MED ORDER — FLUCONAZOLE 100MG IVPB
100.0000 mg | INTRAVENOUS | Status: DC
  Administered 2021-05-11 – 2021-05-14 (×4): 100 mg via INTRAVENOUS
  Filled 2021-05-11 (×8): qty 50

## 2021-05-11 MED ORDER — SODIUM CHLORIDE 0.9 % IV SOLN: 100.0000 mg | Freq: Two times a day (BID) | INTRAVENOUS | Status: DC

## 2021-05-11 MED ORDER — IPRATROPIUM-ALBUTEROL 0.5-2.5 (3) MG/3ML IN SOLN: 3.0000 mL | Freq: Four times a day (QID) | RESPIRATORY_TRACT | Status: DC | PRN

## 2021-05-11 MED ORDER — MAGNESIUM OXIDE -MG SUPPLEMENT 400 (240 MG) MG PO TABS
400.0000 mg | ORAL_TABLET | Freq: Every evening | ORAL | Status: DC
  Administered 2021-05-11 – 2021-05-15 (×5): 400 mg via ORAL
  Filled 2021-05-11 (×6): qty 1

## 2021-05-11 MED ORDER — TORSEMIDE 20 MG PO TABS
20.0000 mg | ORAL_TABLET | ORAL | Status: DC
  Administered 2021-05-12: 20 mg via ORAL
  Filled 2021-05-11: qty 1

## 2021-05-11 MED ORDER — SULFAMETHOXAZOLE-TRIMETHOPRIM 800-160 MG PO TABS: 1.0000 | ORAL_TABLET | Freq: Two times a day (BID) | ORAL | Status: DC

## 2021-05-11 MED ORDER — MAGNESIUM 500 MG PO TABS: 500.0000 mg | ORAL_TABLET | Freq: Every evening | ORAL | Status: DC

## 2021-05-11 MED ORDER — VANCOMYCIN HCL 1000 MG/200ML IV SOLN
1000.0000 mg | INTRAVENOUS | Status: DC
  Administered 2021-05-11 – 2021-05-12 (×2): 1000 mg via INTRAVENOUS
  Filled 2021-05-11 (×3): qty 200

## 2021-05-11 MED ORDER — BUDESONIDE 0.25 MG/2ML IN SUSP: 0.2500 mg | Freq: Two times a day (BID) | RESPIRATORY_TRACT | Status: DC

## 2021-05-11 MED ORDER — FEBUXOSTAT 40 MG PO TABS
40.0000 mg | ORAL_TABLET | Freq: Every day | ORAL | Status: DC
  Administered 2021-05-11 – 2021-05-15 (×5): 40 mg via ORAL
  Filled 2021-05-11 (×6): qty 1

## 2021-05-11 MED ORDER — NYSTATIN 100000 UNIT/GM EX POWD
Freq: Three times a day (TID) | CUTANEOUS | Status: DC
  Filled 2021-05-11 (×3): qty 15

## 2021-05-11 MED ORDER — SPIRONOLACTONE 25 MG PO TABS
25.0000 mg | ORAL_TABLET | Freq: Every day | ORAL | Status: DC
  Administered 2021-05-12 – 2021-05-16 (×5): 25 mg via ORAL
  Filled 2021-05-11 (×5): qty 1

## 2021-05-11 MED ORDER — LOSARTAN POTASSIUM 50 MG PO TABS
25.0000 mg | ORAL_TABLET | Freq: Every day | ORAL | Status: DC
  Administered 2021-05-11 – 2021-05-15 (×5): 25 mg via ORAL
  Filled 2021-05-11 (×5): qty 1

## 2021-05-11 MED ORDER — SPIRONOLACTONE 25 MG PO TABS: 25.0000 mg | ORAL_TABLET | Freq: Every day | ORAL | Status: DC

## 2021-05-11 MED ORDER — BUDESONIDE 0.25 MG/2ML IN SUSP
0.2500 mg | Freq: Two times a day (BID) | RESPIRATORY_TRACT | Status: DC
  Administered 2021-05-11 – 2021-05-16 (×8): 0.25 mg via RESPIRATORY_TRACT
  Filled 2021-05-11 (×12): qty 2

## 2021-05-11 NOTE — Progress Notes (Signed)
[facility] PROGRESS NOTE    Progress Note  Kathleen [name]  [id number] DOB: May 30, 1946 DOA: 05/10/2021 PCP: [name], MD     Brief Narrative:   Kathleen [name] is an 75 y.o. 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   Assessment/Plan:   Sepsis extensive chest wall cellulitis under breast bilaterally and in the groin area bilaterally/Candida intertrigo: Blood cultures have been ordered.  Sepsis physiology has resolved Started on Rocephin, will add Bactrim for MRSA Has remained afebrile since admission. Was started on IV fluids for also dehydration. When she is able to take orals discontinue IV fluids.  Persistent atrial fibrillation: With a CHADS Vascor of 5 Continue Xarelto, Tikosyn, potassium is 4 magnesium is pending.  Chronic diastolic heart failure: Last 2D echo in 2022 showed an EF of 55% she appears euvolemic on physical exam. Resume home dose of diuretics.  Chronic respiratory failure with hypoxia: Continue 2 L of oxygen and inhalers.  Essential hypertension: No changes made to his medication continue current home regimen restart losartan.      DVT prophylaxis: Xarelto Family Communication:husband Status is: Observation  The patient will require care spanning > 2 midnights and should be moved to inpatient because: Sepsis due to extensive severe bilateral chest wall and bilateral groin cellulitis and candidal intertrigo       Code Status:     Code Status Orders  (From admission, onward)           Start     Ordered   05/10/21 2128  Full code  Continuous        05/10/21  2127           Code Status History     Date Active Date Inactive Code Status Order ID Comments User Context   11/14/2020 1422 11/19/2020 0529 Full Code [id number]  Theotis Barrio, MD ED   06/26/2019 1742 06/29/2019 0101 Full Code [id number]  Rolly Salter, MD ED   03/28/2018 1836 03/30/2018 1811 Full Code [id number]  Enedina Finner, MD Inpatient   09/17/2015 1847 09/23/2015 1751 Full Code [id number]  Milagros Loll, MD ED   09/08/2015 2032 09/11/2015 1958 Full Code [id number]  Katharina Caper, MD Inpatient         IV Access:   Peripheral IV   Procedures and diagnostic studies:   DG Chest Port 1 View  Result Date: 05/10/2021 CLINICAL DATA:  Evaluate for sepsis EXAM: PORTABLE CHEST 1 VIEW COMPARISON:  None. FINDINGS: LEFT-sided pacemaker overlies stable enlarged cardiac silhouette. There is bibasilar atelectasis and probable small effusions. No pneumothorax. No pulmonary edema. IMPRESSION: Cardiomegaly, bibasilar atelectasis and bilateral pleural effusions. Electronically Signed   By: Genevive Bi M.D.   On: 05/10/2021 13:11     Medical Consultants:   None.   Subjective:    Kathleen [name] relates she is doing pain, no appetite  Objective:    Vitals:   05/11/21 0400 05/11/21 0453 05/11/21 0600 05/11/21 0609  BP:    (!) 108/54  Pulse: 69  69 70  Resp: (!) 22  18 18   Temp:      TempSrc:      SpO2: 97%  99%  Weight:  101.1 kg    Height:       SpO2: 99 % O2 Flow Rate (L/min): 4 L/min   Intake/Output Summary (Last 24 hours) at 05/11/2021 0748 Last data filed at 05/11/2021 [id number] Gross per 24 hour  Intake 1044.52 ml  Output --  Net 1044.52 ml   Filed Weights   05/10/21 1121 05/10/21 2109 05/11/21 0453  Weight: 100.5 kg 100 kg 101.1 kg    Exam: General exam: In no acute distress. Respiratory system: Good air movement and clear to auscultation. Cardiovascular system: S1 & S2 heard, RRR. No JVD. Gastrointestinal system: Abdomen is nondistended, soft and nontender.   Extremities: No pedal edema. Skin extensive chest wall cellulitis and candidal intertrigo, also she has extensive bilateral groin cellulitis Psychiatry: Judgement and insight appear normal. Mood & affect appropriate.    Data Reviewed:    Labs: Basic Metabolic Panel: Recent Labs  Lab 05/10/21 1224  NA 136  K 4.0  CL 101  CO2 25  GLUCOSE 132*  BUN 13  CREATININE 0.64  CALCIUM 10.0   GFR Estimated Creatinine Clearance: 65.9 mL/min (by C-G formula based on SCr of 0.64 mg/dL). Liver Function Tests: Recent Labs  Lab 05/10/21 1224  AST 9*  ALT 6  ALKPHOS 46  BILITOT 0.8  PROT 6.6  ALBUMIN 4.1   No results for input(s): LIPASE, AMYLASE in the last 168 hours. No results for input(s): AMMONIA in the last 168 hours. Coagulation profile Recent Labs  Lab 05/10/21 1224  INR 1.4*   COVID-19 Labs  No results for input(s): DDIMER, FERRITIN, LDH, CRP in the last 72 hours.  Lab Results  Component Value Date   SARSCOV2NAA NEGATIVE 05/10/2021   SARSCOV2NAA NEGATIVE 04/14/2021   [place] NEGATIVE 11/17/2020   [place] NEGATIVE 11/14/2020    CBC: Recent Labs  Lab 05/10/21 1224 05/11/21 0707  WBC 13.0* 6.7  NEUTROABS 11.0* 4.8  HGB 12.2 11.5*  HCT 39.3 36.8  MCV 81.9 83.4  PLT 238 190   Cardiac Enzymes: No results for input(s): CKTOTAL, CKMB, CKMBINDEX, TROPONINI in the last 168 hours. BNP (last 3 results) No results for input(s): PROBNP in the last 8760 hours. CBG: No results for input(s): GLUCAP in the last 168 hours. D-Dimer: No results for input(s): DDIMER in the last 72 hours. Hgb A1c: No results for input(s): HGBA1C in the last 72 hours. Lipid Profile: No results for input(s): CHOL, HDL, LDLCALC, TRIG, CHOLHDL, LDLDIRECT in the last 72 hours. Thyroid function studies: No results for input(s): TSH, T4TOTAL, T3FREE, THYROIDAB in the last 72 hours.  Invalid input(s): FREET3 Anemia work up: No results for input(s): VITAMINB12, FOLATE, FERRITIN,  TIBC, IRON, RETICCTPCT in the last 72 hours. Sepsis Labs: Recent Labs  Lab 05/10/21 1224 05/10/21 2156 05/11/21 0707  WBC 13.0*  --  6.7  LATICACIDVEN 1.8 1.6  --    Microbiology Recent Results (from the past 240 hour(s))  Resp Panel by RT-PCR (Flu A&B, Covid)     Status: None   Collection Time: 05/10/21  5:20 PM   Specimen: Nasopharyngeal(NP) swabs in vial transport medium  Result Value Ref Range Status   SARS Coronavirus 2 by RT PCR NEGATIVE NEGATIVE Final    Comment: (NOTE) SARS-CoV-2 target nucleic acids are NOT DETECTED.  The SARS-CoV-2 RNA is generally detectable in upper respiratory specimens during the acute phase of infection. The lowest concentration of SARS-CoV-2 viral copies this assay can detect is 138 copies/mL. A negative result does not preclude SARS-Cov-2 infection and should  not be used as the sole basis for treatment or other patient management decisions. A negative result may occur with  improper specimen collection/handling, submission of specimen other than nasopharyngeal swab, presence of viral mutation(s) within the areas targeted by this assay, and inadequate number of viral copies(<138 copies/mL). A negative result must be combined with clinical observations, patient history, and epidemiological information. The expected result is Negative.  Fact Sheet for Patients:  EntrepreneurPulse.com.au  Fact Sheet for Healthcare Providers:  IncredibleEmployment.be  This test is no t yet approved or cleared by the [place] FDA and  has been authorized for detection and/or diagnosis of SARS-CoV-2 by FDA under an Emergency Use Authorization (EUA). This EUA will remain  in effect (meaning this test can be used) for the duration of the COVID-19 declaration under Section 564(b)(1) of the Act, 21 U.S.C.section 360bbb-3(b)(1), unless the authorization is terminated  or revoked sooner.       Influenza A by PCR NEGATIVE  NEGATIVE Final   Influenza B by PCR NEGATIVE NEGATIVE Final    Comment: (NOTE) The Xpert Xpress SARS-CoV-2/FLU/RSV plus assay is intended as an aid in the diagnosis of influenza from Nasopharyngeal swab specimens and should not be used as a sole basis for treatment. Nasal washings and aspirates are unacceptable for Xpert Xpress SARS-CoV-2/FLU/RSV testing.  Fact Sheet for Patients: EntrepreneurPulse.com.au  Fact Sheet for Healthcare Providers: IncredibleEmployment.be  This test is not yet approved or cleared by the [place] FDA and has been authorized for detection and/or diagnosis of SARS-CoV-2 by FDA under an Emergency Use Authorization (EUA). This EUA will remain in effect (meaning this test can be used) for the duration of the COVID-19 declaration under Section 564(b)(1) of the Act, 21 U.S.C. section 360bbb-3(b)(1), unless the authorization is terminated or revoked.  Performed at [facility], [address], [place], [place] [address]      Medications:    arformoterol  15 mcg Nebulization BID   dofetilide  250 mcg Oral BID   ezetimibe  10 mg Oral QHS   Gerhardt's butt cream   Topical BID   levothyroxine  50 mcg Oral QAC breakfast   rivaroxaban  20 mg Oral Q supper   Continuous Infusions:  sodium chloride Stopped (05/10/21 1335)   cefTRIAXone (ROCEPHIN)  IV 1 g (05/11/21 0605)      LOS: 0 days   Charlynne Cousins  [facility]  05/11/2021, 7:48 AM

## 2021-05-11 NOTE — Progress Notes (Signed)
Pharmacy Antibiotic Note  Kathleen [name] is a 75 y.o. female admitted on 05/10/2021 with cellulitis.  Pharmacy has been consulted for vancomycin dosing.  Plan: Vancomycin 1000 mg iv q24h . Estimated AUC: 458.8 mcg*hr/mL   Height: 5' (152.4 cm) Weight: 101.1 kg (222 lb 14.2 oz) IBW/kg (Calculated) : 45.5  Temp (24hrs), Avg:99.3 F (37.4 C), Min:98.2 F (36.8 C), Max:100 F (37.8 C)  Recent Labs  Lab 05/10/21 1224 05/10/21 2156 05/11/21 0707  WBC 13.0*  --  6.7  CREATININE 0.64  --  0.76  LATICACIDVEN 1.8 1.6  --     Estimated Creatinine Clearance: 65.9 mL/min (by C-G formula based on SCr of 0.76 mg/dL).    Allergies  Allergen Reactions   Allopurinol Other (See Comments)    Reaction:  Dizziness    Clindamycin Anaphylaxis and Hives   Pneumococcal 13-Val Conj Vacc Itching, Swelling and Rash   Dronedarone Rash   Entresto [Sacubitril-Valsartan] Other (See Comments)    hypotension   Fosamax [Alendronate Sodium] Nausea Only   Meperidine Nausea And Vomiting   Rosuvastatin Other (See Comments)    Reaction:  Muscle spasms    Tetracycline Hives   Adhesive [Tape] Rash   Lovastatin Rash    Other reaction(s): Muscle Pain    Antimicrobials this admission: 12/2 CTX >>  12/3 Vanco >>    Microbiology results: 12/3 BCx: in progress 12/3 UCx: in progress    Thank you for allowing pharmacy to be a part of this patient's care.  Greta Doom BS, PharmD, BCPS Clinical Pharmacist 05/11/2021 8:40 AM

## 2021-05-11 NOTE — Progress Notes (Signed)
Patient arrived to unit via stretcher from [facility], pt placed in RM [id number], full assessment completed upon arrival, pt oriented to the room, call bell within reach. Bed at lowest position.

## 2021-05-11 NOTE — Plan of Care (Signed)

## 2021-05-12 DIAGNOSIS — I480 Paroxysmal atrial fibrillation: Secondary | ICD-10-CM

## 2021-05-12 DIAGNOSIS — L03314 Cellulitis of groin: Secondary | ICD-10-CM

## 2021-05-12 LAB — URINE CULTURE

## 2021-05-12 LAB — CBC
HCT: 34.7 % — ABNORMAL LOW (ref 36.0–46.0)
Hemoglobin: 10.9 g/dL — ABNORMAL LOW (ref 12.0–15.0)
MCH: 25.6 pg — ABNORMAL LOW (ref 26.0–34.0)
MCHC: 31.4 g/dL (ref 30.0–36.0)
MCV: 81.6 fL (ref 80.0–100.0)
Platelets: 202 10*3/uL (ref 150–400)
RBC: 4.25 MIL/uL (ref 3.87–5.11)
RDW: 14.6 % (ref 11.5–15.5)
WBC: 7.1 10*3/uL (ref 4.0–10.5)
nRBC: 0 % (ref 0.0–0.2)

## 2021-05-12 LAB — BASIC METABOLIC PANEL
Anion gap: 6 (ref 5–15)
BUN: 8 mg/dL (ref 8–23)
CO2: 26 mmol/L (ref 22–32)
Calcium: 8.5 mg/dL — ABNORMAL LOW (ref 8.9–10.3)
Chloride: 103 mmol/L (ref 98–111)
Creatinine, Ser: 0.71 mg/dL (ref 0.44–1.00)
GFR, Estimated: >60 mL/min (ref >60)
Glucose, Bld: 160 mg/dL — ABNORMAL HIGH (ref 70–99)
Potassium: 3.8 mmol/L (ref 3.5–5.1)
Sodium: 135 mmol/L (ref 135–145)

## 2021-05-12 MED ORDER — POTASSIUM CHLORIDE CRYS ER 20 MEQ PO TBCR
20.0000 meq | EXTENDED_RELEASE_TABLET | Freq: Two times a day (BID) | ORAL | Status: AC
  Administered 2021-05-12: 20 meq via ORAL
  Filled 2021-05-12: qty 1

## 2021-05-12 MED ORDER — MAGNESIUM OXIDE -MG SUPPLEMENT 400 (240 MG) MG PO TABS
400.0000 mg | ORAL_TABLET | Freq: Two times a day (BID) | ORAL | Status: AC
  Administered 2021-05-12 (×2): 400 mg via ORAL
  Filled 2021-05-12: qty 1

## 2021-05-12 MED ORDER — POLYETHYLENE GLYCOL 3350 17 G PO PACK
17.0000 g | PACK | Freq: Two times a day (BID) | ORAL | Status: AC
  Administered 2021-05-12 – 2021-05-13 (×4): 17 g via ORAL
  Filled 2021-05-12 (×4): qty 1

## 2021-05-12 MED ORDER — POTASSIUM CHLORIDE CRYS ER 10 MEQ PO TBCR
EXTENDED_RELEASE_TABLET | ORAL | Status: AC
  Administered 2021-05-12: 20 meq
  Filled 2021-05-12: qty 2

## 2021-05-12 NOTE — Progress Notes (Signed)
Patient with essential HTN. Here for RN visit for BP check.   Arvilla Meres, MD  10:01 PM

## 2021-05-12 NOTE — Plan of Care (Signed)
  Problem: Education: Goal: Knowledge of General Education information will improve Description: Including pain rating scale, medication(s)/side effects and non-pharmacologic comfort measures Outcome: Progressing   Problem: Health Behavior/Discharge Planning: Goal: Ability to manage health-related needs will improve Outcome: Progressing   Problem: Clinical Measurements: Goal: Respiratory complications will improve Outcome: Progressing   

## 2021-05-12 NOTE — Evaluation (Signed)
Physical Therapy Evaluation Patient Details Name: Kathleen [name] MRN: [id number] DOB: 24-Nov-1945 Today's Date: 05/12/2021  History of Present Illness  Patient is a 75 y/o female who presents from [facility] on 12/4 due to chest wall erythema and drainage. Found to have sepsis due to chest wall cellulitis. CXR-bilbasiliar atelectasis and bil pleural effusions. PMH includes respiratory failure on home 02, combined CHF, COPD, asthma, OSA, BOOP, complete heart block, A-flutter, HTN, nonischemic cardiomyopathy.  Clinical Impression  Patient presents with generalized weakness, dyspnea on exertion, decreased activity tolerance, endurance and impaired mobility s/p above. Pt lives at [facility] and reports using rollator vs SPC for ambulation PTA vs furniture walking. Today, pt tolerated bed mobility, transfers and gait training with Min guard assist requiring a few standing rest breaks due to fatigue/SOB. Sp02 remained >93% on 4L/min 02 [place]. Encouraged walking to bathroom with nursing and increasing activity. Currently not functioning at baseline but will likely improve with increased mobility and may not need HHPT. Will follow acutely to maximize independence and mobility prior to return home. Would benefit from working with mobility tech- consulted.        Recommendations for follow up therapy are one component of a multi-disciplinary discharge planning process, led by the attending physician.  Recommendations may be updated based on patient status, additional functional criteria and insurance authorization.  Follow Up Recommendations Home health PT (pending improvement)    Assistance Recommended at Discharge Intermittent Supervision/Assistance  Functional Status Assessment Patient has had a recent decline in their functional status and demonstrates the ability to make significant improvements in function in a reasonable and predictable amount of time.  Equipment Recommendations  None recommended by  PT    Recommendations for Other Services       Precautions / Restrictions Precautions Precautions: Fall Restrictions Weight Bearing Restrictions: No      Mobility  Bed Mobility Overal bed mobility: Needs Assistance Bed Mobility: Supine to Sit     Supine to sit: Supervision;HOB elevated     General bed mobility comments: Use of rail, + dizziness.    Transfers Overall transfer level: Needs assistance Equipment used: None Transfers: Sit to/from Stand Sit to Stand: Min guard           General transfer comment: Min guard for safety, stood initially with RW in front but pushed it away due to not feeling safe using it, Stood from EOB x1, from toilet x1, transferred to chair post ambulation.    Ambulation/Gait Ambulation/Gait assistance: Min guard Gait Distance (Feet): 140 Feet Assistive device: None (rail PRN) Gait Pattern/deviations: Step-through pattern;Decreased stride length;Wide base of support Gait velocity: decreased Gait velocity interpretation: <1.31 ft/sec, indicative of household ambulator   General Gait Details: Slow, guarded gait holding onto rail at times, a few standing rest breaks. 2/4 DOE. Sp02 remained >93% on 4L/min 02 [place].  Stairs            Wheelchair Mobility    Modified Rankin (Stroke Patients Only)       Balance Overall balance assessment: Needs assistance Sitting-balance support: Feet supported;No upper extremity supported Sitting balance-Leahy Scale: Good Sitting balance - Comments: Able to donn socks without LOB.   Standing balance support: During functional activity Standing balance-Leahy Scale: Fair Standing balance comment: Able to stand statically without UE support but needs UE support PRN with walking.  Pertinent Vitals/Pain Pain Assessment: Faces Faces Pain Scale: Hurts even more Pain Location: bil shoulders Pain Descriptors / Indicators: Sore;Aching Pain Intervention(s):  Monitored during session;Repositioned    Home Living Family/patient expects to be discharged to:: Private residence ([facility]) Living Arrangements: Alone Available Help at Discharge: Family;Available PRN/intermittently Type of Home: Independent living facility Home Access: Elevator       Home Layout: One level Home Equipment: Rollator (4 wheels);Cane - single point;Shower seat      Prior Function Prior Level of Function : Independent/Modified Independent             Mobility Comments: Uses rollator to carry oxygen, uses SPC for community. Drives. Does IADLs. ADLs Comments: Independent     Hand Dominance   Dominant Hand: Right    Extremity/Trunk Assessment   Upper Extremity Assessment Upper Extremity Assessment: Defer to OT evaluation    Lower Extremity Assessment Lower Extremity Assessment: Generalized weakness    Cervical / Trunk Assessment Cervical / Trunk Assessment: Normal  Communication   Communication: No difficulties  Cognition Arousal/Alertness: Awake/alert Behavior During Therapy: WFL for tasks assessed/performed Overall Cognitive Status: Within Functional Limits for tasks assessed                                          General Comments General comments (skin integrity, edema, etc.): Sp02 remained in mid-high 90s on rL/min 02 [place]    Exercises     Assessment/Plan    PT Assessment Patient needs continued PT services  PT Problem List Decreased strength;Decreased mobility;Cardiopulmonary status limiting activity;Decreased activity tolerance;Decreased balance       PT Treatment Interventions Therapeutic activities;Gait training;Functional mobility training;Balance training;Therapeutic exercise;Patient/family education    PT Goals (Current goals can be found in the Care Plan section)  Acute Rehab PT Goals Patient Stated Goal: to get better PT Goal Formulation: With patient Time For Goal Achievement:  05/26/21 Potential to Achieve Goals: Good    Frequency Min 3X/week   Barriers to discharge Decreased caregiver support      Co-evaluation               AM-PAC PT "6 Clicks" Mobility  Outcome Measure Help needed turning from your back to your side while in a flat bed without using bedrails?: A Little Help needed moving from lying on your back to sitting on the side of a flat bed without using bedrails?: A Little Help needed moving to and from a bed to a chair (including a wheelchair)?: A Little Help needed standing up from a chair using your arms (e.g., wheelchair or bedside chair)?: A Little Help needed to walk in hospital room?: A Little Help needed climbing 3-5 steps with a railing? : A Little 6 Click Score: 18    End of Session Equipment Utilized During Treatment: Oxygen Activity Tolerance: Patient tolerated treatment well Patient left: in chair;with call bell/phone within reach Nurse Communication: Mobility status PT Visit Diagnosis: Muscle weakness (generalized) (M62.81);Difficulty in walking, not elsewhere classified (R26.2)    Time: 6283-1517 PT Time Calculation (min) (ACUTE ONLY): 26 min   Charges:   PT Evaluation $PT Eval Moderate Complexity: 1 Mod PT Treatments $Gait Training: 8-22 mins        Vale Haven, PT, DPT Acute Rehabilitation Services Pager [phone] Office [phone]     Blake Divine A Cace Osorto 05/12/2021, 9:20 AM

## 2021-05-12 NOTE — Progress Notes (Signed)
Nutrition Brief Note  Patient identified on the Malnutrition Screening Tool (MST) Report.  Spoke with pt at bedside. Pt reports limited PO over the last 4 days PTA. She reports having her appetite back and has eaten most of her breakfast and lunch per RD observation and pt report.  Wt Readings from Last 15 Encounters:  05/12/21 100.1 kg  05/06/21 100.5 kg  04/22/21 101.2 kg  03/27/21 100.7 kg  02/13/21 100.5 kg  01/29/21 99.2 kg  01/22/21 102.6 kg  01/19/21 99.8 kg  01/02/21 101.9 kg  12/10/20 99.8 kg  11/18/20 103.1 kg  11/07/20 101.6 kg  06/26/19 96.2 kg  01/25/19 99.6 kg  07/26/18 98.3 kg   Pt denies any recent changes in her weight other than some fluid shift - 7 lbs in 5 days due to water weight loss and dehydration.   However, weight remains stable per Epic as seen above.  On exam, pt with no depletions.  Body mass index is 43.1 kg/m. Patient meets criteria for Obesity Class 3 based on current BMI.   Current diet order is Regular, patient is consuming approximately 50-85%% of meals at this time. Labs and medications reviewed.   No nutrition interventions warranted at this time. If nutrition issues arise, please consult RD.   Vertell Limber, RD, LDN (she/her/hers) Clinical Inpatient Dietitian RD Pager/After-Hours/Weekend Pager # in [place]

## 2021-05-12 NOTE — Progress Notes (Signed)
Mobility Specialist Criteria Algorithm Info.   05/12/21 1035  Mobility  Activity Ambulated in hall  Range of Motion/Exercises Active;All extremities  Level of Assistance Standby assist, set-up cues, supervision of patient - no hands on  Assistive Device Four wheel walker  Distance Ambulated (ft) 200 ft  Mobility Ambulated with assistance in hallway  Mobility Response Tolerated well  Mobility performed by Mobility specialist  Bed Position Chair   Patient received lying in bed, eager to participate. Got to EOB independently and stood with supervision. Ambulated in hallway with slow steady gait requiring one standing rest break. Required standing rest x3 secondary to fatigue. Returned to room without incident or complaint. Was left lying supine in bed with all needs met and call bell in reach.  05/12/2021 11:06 AM

## 2021-05-12 NOTE — Progress Notes (Signed)
[facility] PROGRESS NOTE    Progress Note  Kathleen [name]  [id number] DOB: 12/29/1945 DOA: 05/10/2021 PCP: [name], MD     Brief Narrative:   Kathleen [name] is an 75 y.o. 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   Assessment/Plan:   Sepsis extensive chest wall cellulitis under breast bilaterally and in the groin area bilaterally/[place]ida intertrigo: Blood cultures have been ordered.  Sepsis physiology has resolved. T-max of 98.8  Continue Rocephin and IV vancomycin Physical therapy has been consulted recommend home health PT. The erythema is improved, still tender but it has receded on the demarcated areas.  Persistent atrial fibrillation: With a CHADS Vascor of 5 Continue Xarelto, Tikosyn, potassium is 4 magnesium is pending.  Chronic diastolic heart failure: Last 2D echo in 2022 showed an EF of 55% she appears euvolemic on physical exam. Resume home dose of diuretics.  Chronic respiratory failure with hypoxia: Continue 2 L of oxygen and inhalers.  Essential hypertension: No changes made to his medication continue current home regimen restart losartan.      DVT prophylaxis: Xarelto Family Communication:husband Status is: Observation  The patient will require care spanning > 2 midnights and should be moved to inpatient because: Sepsis due to extensive severe bilateral chest wall and bilateral groin cellulitis and candidal intertrigo       Code Status:     Code Status Orders  (From admission, onward)           Start     Ordered   05/10/21 2128  Full code  Continuous         05/10/21 2127           Code Status History     Date Active Date Inactive Code Status Order ID Comments User Context   11/14/2020 1422 11/19/2020 0529 Full Code [id number]  Mosetta Anis, MD ED   06/26/2019 1742 06/29/2019 0101 Full Code [id number]  Lavina Hamman, MD ED   03/28/2018 1836 03/30/2018 1811 Full Code [id number]  Fritzi Mandes, MD Inpatient   09/17/2015 1847 09/23/2015 1751 Full Code [id number]  Hillary Bow, MD ED   09/08/2015 2032 09/11/2015 1958 Full Code [id number]  Theodoro Grist, MD Inpatient         IV Access:   Peripheral IV   Procedures and diagnostic studies:   DG Chest Port 1 View  Result Date: 05/10/2021 CLINICAL DATA:  Evaluate for sepsis EXAM: PORTABLE CHEST 1 VIEW COMPARISON:  None. FINDINGS: LEFT-sided pacemaker overlies stable enlarged cardiac silhouette. There is bibasilar atelectasis and probable small effusions. No pneumothorax. No pulmonary edema. IMPRESSION: Cardiomegaly, bibasilar atelectasis and bilateral pleural effusions. Electronically Signed   By: Suzy Bouchard M.D.   On: 05/10/2021 13:11     Medical Consultants:   None.   Subjective:    SHAQUILLE FISHBURN relates her pain is improved appetite has also improved  Objective:    Vitals:   05/12/21 0355 05/12/21 0417 05/12/21 0736 05/12/21 0835  BP:  135/63 118/66   Pulse:  70 70 73  Resp:  18 16 18   Temp:  97.8 F (36.6 C) 97.9 F (36.6 C)   TempSrc:  Oral  Oral   SpO2:  96% 97% 98%  Weight: 100.1 kg     Height:       SpO2: 98 % O2 Flow Rate (L/min): 4 L/min   Intake/Output Summary (Last 24 hours) at 05/12/2021 1019 Last data filed at 05/11/2021 1500 Gross per 24 hour  Intake 250 ml  Output --  Net 250 ml    Filed Weights   05/10/21 2109 05/11/21 0453 05/12/21 0355  Weight: 100 kg 101.1 kg 100.1 kg    Exam: General exam: In no acute distress. Respiratory system: Good air movement and clear to auscultation. Cardiovascular system: S1 & S2 heard, RRR. No  JVD. Gastrointestinal system: Abdomen is nondistended, soft and nontender.  Skin extensive chest wall cellulitis and candidal intertrigo have receded, not as tender to touch as yesterday  psychiatry: Judgement and insight appear normal. Mood & affect appropriate.    Data Reviewed:    Labs: Basic Metabolic Panel: Recent Labs  Lab 05/10/21 1224 05/11/21 0707 05/12/21 0230  NA 136 137 135  K 4.0 3.9 3.8  CL 101 104 103  CO2 25 23 26   GLUCOSE 132* 107* 160*  BUN 13 9 8   CREATININE 0.64 0.76 0.71  CALCIUM 10.0 8.6* 8.5*  MG  --  1.9  --     GFR Estimated Creatinine Clearance: 65.5 mL/min (by C-G formula based on SCr of 0.71 mg/dL). Liver Function Tests: Recent Labs  Lab 05/10/21 1224 05/11/21 0707  AST 9* 11*  ALT 6 8  ALKPHOS 46 48  BILITOT 0.8 0.6  PROT 6.6 5.6*  ALBUMIN 4.1 2.9*    No results for input(s): LIPASE, AMYLASE in the last 168 hours. No results for input(s): AMMONIA in the last 168 hours. Coagulation profile Recent Labs  Lab 05/10/21 1224  INR 1.4*    COVID-19 Labs  No results for input(s): DDIMER, FERRITIN, LDH, CRP in the last 72 hours.  Lab Results  Component Value Date   SARSCOV2NAA NEGATIVE 05/10/2021   SARSCOV2NAA NEGATIVE 04/14/2021   [place] NEGATIVE 11/17/2020   [place] NEGATIVE 11/14/2020    CBC: Recent Labs  Lab 05/10/21 1224 05/11/21 0707 05/12/21 0230  WBC 13.0* 6.7 7.1  NEUTROABS 11.0* 4.8  --   HGB 12.2 11.5* 10.9*  HCT 39.3 36.8 34.7*  MCV 81.9 83.4 81.6  PLT 238 190 202    Cardiac Enzymes: No results for input(s): CKTOTAL, CKMB, CKMBINDEX, TROPONINI in the last 168 hours. BNP (last 3 results) No results for input(s): PROBNP in the last 8760 hours. CBG: No results for input(s): GLUCAP in the last 168 hours. D-Dimer: No results for input(s): DDIMER in the last 72 hours. Hgb A1c: No results for input(s): HGBA1C in the last 72 hours. Lipid Profile: No results for input(s): CHOL, HDL, LDLCALC, TRIG,  CHOLHDL, LDLDIRECT in the last 72 hours. Thyroid function studies: No results for input(s): TSH, T4TOTAL, T3FREE, THYROIDAB in the last 72 hours.  Invalid input(s): FREET3 Anemia work up: No results for input(s): VITAMINB12, FOLATE, FERRITIN, TIBC, IRON, RETICCTPCT in the last 72 hours. Sepsis Labs: Recent Labs  Lab 05/10/21 1224 05/10/21 2156 05/11/21 0707 05/12/21 0230  WBC 13.0*  --  6.7 7.1  LATICACIDVEN 1.8 1.6  --   --     Microbiology Recent Results (from the past 240 hour(s))  Blood Culture (routine x 2)     Status: None (Preliminary result)   Collection Time: 05/10/21 12:19 PM   Specimen: BLOOD  Result Value Ref Range Status   Specimen  Description   Final    BLOOD BLOOD LEFT ARM Performed at [facility], [address], [place], [place] [address]    Special Requests   Final    BOTTLES DRAWN AEROBIC AND ANAEROBIC Blood Culture results may not be optimal due to an excessive volume of blood received in culture bottles Performed at [facility], [address] [place], [place] [address]    Culture   Final    NO GROWTH 2 DAYS Performed at [facility], [address] [place], [place] [address]    Report Status PENDING  Incomplete  Blood Culture (routine x 2)     Status: None (Preliminary result)   Collection Time: 05/10/21 12:20 PM   Specimen: BLOOD  Result Value Ref Range Status   Specimen Description   Final    BLOOD BLOOD RIGHT ARM Performed at [facility], [address], [place], [place] [address]    Special Requests   Final    BOTTLES DRAWN AEROBIC AND ANAEROBIC Blood Culture adequate volume Performed at [facility], [address] [place], [place] [address]    Culture   Final    NO GROWTH 2 DAYS Performed at [facility], [address], [place], [place] [address]    Report Status PENDING  Incomplete  Urine Culture     Status: Abnormal   Collection Time:  05/10/21  5:20 PM   Specimen: In/Out Cath Urine  Result Value Ref Range Status   Specimen Description   Final    IN/OUT CATH URINE Performed at [facility], [address] [place], [place] [address]    Special Requests   Final    NONE Performed at [facility], [address], [place], [place] [address]    Culture MULTIPLE SPECIES PRESENT, SUGGEST RECOLLECTION (A)  Final   Report Status 05/12/2021 FINAL  Final  Resp Panel by RT-PCR (Flu A&B, Covid)     Status: None   Collection Time: 05/10/21  5:20 PM   Specimen: Nasopharyngeal(NP) swabs in vial transport medium  Result Value Ref Range Status   SARS Coronavirus 2 by RT PCR NEGATIVE NEGATIVE Final    Comment: (NOTE) SARS-CoV-2 target nucleic acids are NOT DETECTED.  The SARS-CoV-2 RNA is generally detectable in upper respiratory specimens during the acute phase of infection. The lowest concentration of SARS-CoV-2 viral copies this assay can detect is 138 copies/mL. A negative result does not preclude SARS-Cov-2 infection and should not be used as the sole basis for treatment or other patient management decisions. A negative result may occur with  improper specimen collection/handling, submission of specimen other than nasopharyngeal swab, presence of viral mutation(s) within the areas targeted by this assay, and inadequate number of viral copies(<138 copies/mL). A negative result must be combined with clinical observations, patient history, and epidemiological information. The expected result is Negative.  Fact Sheet for Patients:  BloggerCourse.com  Fact Sheet for Healthcare Providers:  SeriousBroker.it  This test is no t yet approved or cleared by the [place] FDA and  has been authorized for detection and/or diagnosis of SARS-CoV-2 by FDA under an Emergency Use Authorization (EUA). This EUA will remain  in effect (meaning this  test can be used) for the duration of the COVID-19 declaration under Section 564(b)(1) of the Act, 21 U.S.C.section 360bbb-3(b)(1), unless the authorization is terminated  or revoked sooner.       Influenza A by PCR NEGATIVE NEGATIVE Final   Influenza  B by PCR NEGATIVE NEGATIVE Final    Comment: (NOTE) The Xpert Xpress SARS-CoV-2/FLU/RSV plus assay is intended as an aid in the diagnosis of influenza from Nasopharyngeal swab specimens and should not be used as a sole basis for treatment. Nasal washings and aspirates are unacceptable for Xpert Xpress SARS-CoV-2/FLU/RSV testing.  Fact Sheet for Patients: EntrepreneurPulse.com.au  Fact Sheet for Healthcare Providers: IncredibleEmployment.be  This test is not yet approved or cleared by the [place] FDA and has been authorized for detection and/or diagnosis of SARS-CoV-2 by FDA under an Emergency Use Authorization (EUA). This EUA will remain in effect (meaning this test can be used) for the duration of the COVID-19 declaration under Section 564(b)(1) of the Act, 21 U.S.C. section 360bbb-3(b)(1), unless the authorization is terminated or revoked.  Performed at [facility], [address], [place], [place] [address]      Medications:    arformoterol  15 mcg Nebulization BID   budesonide  0.25 mg Nebulization BID   dofetilide  250 mcg Oral BID   ezetimibe  10 mg Oral QHS   febuxostat  40 mg Oral QHS   Gerhardt's butt cream   Topical BID   levothyroxine  50 mcg Oral QAC breakfast   losartan  25 mg Oral QHS   magnesium oxide  400 mg Oral QPM   magnesium oxide  400 mg Oral BID   nystatin   Topical TID   potassium chloride  20 mEq Oral BID   rivaroxaban  20 mg Oral Q supper   spironolactone  25 mg Oral Daily   torsemide  20 mg Oral Once per day on Mon Thu   Continuous Infusions:  sodium chloride Stopped (05/10/21 1335)   cefTRIAXone (ROCEPHIN)  IV 1 g (05/11/21  0605)   fluconazole (DIFLUCAN) IV 100 mg (05/12/21 0906)   vancomycin 1,000 mg (05/11/21 1018)      LOS: 1 day   Charlynne Cousins  [facility]  05/12/2021, 10:19 AM

## 2021-05-13 MED ORDER — CEPHALEXIN 500 MG PO CAPS
500.0000 mg | ORAL_CAPSULE | Freq: Three times a day (TID) | ORAL | Status: DC
  Administered 2021-05-13 – 2021-05-16 (×10): 500 mg via ORAL
  Filled 2021-05-13 (×11): qty 1

## 2021-05-13 MED ORDER — VANCOMYCIN HCL IN DEXTROSE 1-5 GM/200ML-% IV SOLN
1000.0000 mg | INTRAVENOUS | Status: DC
  Filled 2021-05-13: qty 200

## 2021-05-13 MED ORDER — DOXYCYCLINE HYCLATE 100 MG PO TABS: 100.0000 mg | ORAL_TABLET | Freq: Two times a day (BID) | ORAL | Status: DC

## 2021-05-13 NOTE — Progress Notes (Signed)
[facility] PROGRESS NOTE    Progress Note  Kathleen [name]  [id number] DOB: 07/27/1945 DOA: 05/10/2021 PCP: [name], MD     Brief Narrative:   Kathleen [name] is an 75 y.o. 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   Assessment/Plan:   Sepsis extensive chest wall cellulitis under breast bilaterally and in the groin area bilaterally/Candida intertrigo: Blood cultures have been ordered.  Sepsis physiology has resolved. T-max of 98.1. Transition to oral Keflex will monitor for 24 hours if it continues to improve, she can be discharged tomorrow morning. Physical therapy has been consulted recommend home health PT. Erythema continues to improve.  Persistent atrial fibrillation: With a CHADS Vascor of 5 Continue Xarelto, Tikosyn, potassium is 4 magnesium is pending.  Chronic diastolic heart failure: Last 2D echo in 2022 showed an EF of 55% she appears euvolemic on physical exam. Resume home dose of diuretics.  Chronic respiratory failure with hypoxia: Continue 2 L of oxygen and inhalers.  Essential hypertension: No changes made to his medication continue current home regimen restart losartan.      DVT prophylaxis: Xarelto Family Communication:husband Status is: Observation  The patient will require care spanning > 2 midnights and should be moved to inpatient because: Sepsis due to extensive severe bilateral chest wall and bilateral groin cellulitis and candidal intertrigo       Code Status:     Code Status Orders  (From admission, onward)           Start     Ordered   05/10/21 2128   Full code  Continuous        05/10/21 2127           Code Status History     Date Active Date Inactive Code Status Order ID Comments User Context   11/14/2020 1422 11/19/2020 0529 Full Code [id number]  Mosetta Anis, MD ED   06/26/2019 1742 06/29/2019 0101 Full Code [id number]  Lavina Hamman, MD ED   03/28/2018 1836 03/30/2018 1811 Full Code [id number]  Fritzi Mandes, MD Inpatient   09/17/2015 1847 09/23/2015 1751 Full Code [id number]  Hillary Bow, MD ED   09/08/2015 2032 09/11/2015 1958 Full Code [id number]  Theodoro Grist, MD Inpatient         IV Access:   Peripheral IV   Procedures and diagnostic studies:   No results found.   Medical Consultants:   None.   Subjective:    Kathleen [name] relates her left breast a chest, but the erythema has significantly improved.  Objective:    Vitals:   05/12/21 2002 05/12/21 2043 05/13/21 0318 05/13/21 0615  BP: 123/61  129/68   Pulse: 69  70   Resp: 18  18   Temp: 97.7 F (36.5 C)  97.7 F (36.5 C)   TempSrc: Oral  Oral   SpO2: 100% 99% 100%   Weight:    100.1 kg  Height:       SpO2: 100 % O2 Flow Rate (L/min): 5 L/min  No intake or output data in the 24 hours ending 05/13/21 0805  Filed Weights   05/11/21 0453 05/12/21 0355 05/13/21 0615  Weight:  101.1 kg 100.1 kg 100.1 kg    Exam: General exam: In no acute distress. Respiratory system: Good air movement and clear to auscultation. Cardiovascular system: S1 & S2 heard, RRR. No JVD. Skin extensive chest wall cellulitis and candidal intertrigo have receded, not as tender to touch as yesterday  psychiatry: Judgement and insight appear normal. Mood & affect appropriate.    Data Reviewed:    Labs: Basic Metabolic Panel: Recent Labs  Lab 05/10/21 1224 05/11/21 0707 05/12/21 0230  NA 136 137 135  K 4.0 3.9 3.8  CL 101 104 103  CO2 25 23 26   GLUCOSE 132* 107* 160*  BUN 13 9 8   CREATININE 0.64 0.76 0.71  CALCIUM 10.0 8.6* 8.5*  MG  --  1.9  --      GFR Estimated Creatinine Clearance: 65.5 mL/min (by C-G formula based on SCr of 0.71 mg/dL). Liver Function Tests: Recent Labs  Lab 05/10/21 1224 05/11/21 0707  AST 9* 11*  ALT 6 8  ALKPHOS 46 48  BILITOT 0.8 0.6  PROT 6.6 5.6*  ALBUMIN 4.1 2.9*    No results for input(s): LIPASE, AMYLASE in the last 168 hours. No results for input(s): AMMONIA in the last 168 hours. Coagulation profile Recent Labs  Lab 05/10/21 1224  INR 1.4*    COVID-19 Labs  No results for input(s): DDIMER, FERRITIN, LDH, CRP in the last 72 hours.  Lab Results  Component Value Date   SARSCOV2NAA NEGATIVE 05/10/2021   SARSCOV2NAA NEGATIVE 04/14/2021   SARSCOV2NAA NEGATIVE 11/17/2020   SARSCOV2NAA NEGATIVE 11/14/2020    CBC: Recent Labs  Lab 05/10/21 1224 05/11/21 0707 05/12/21 0230  WBC 13.0* 6.7 7.1  NEUTROABS 11.0* 4.8  --   HGB 12.2 11.5* 10.9*  HCT 39.3 36.8 34.7*  MCV 81.9 83.4 81.6  PLT 238 190 202    Cardiac Enzymes: No results for input(s): CKTOTAL, CKMB, CKMBINDEX, TROPONINI in the last 168 hours. BNP (last 3 results) No results for input(s): PROBNP in the last 8760 hours. CBG: No results for input(s): GLUCAP in the last 168 hours. D-Dimer: No results for input(s): DDIMER in the last 72 hours. Hgb A1c: No results for input(s): HGBA1C in the last 72 hours. Lipid Profile: No results for input(s): CHOL, HDL, LDLCALC, TRIG, CHOLHDL, LDLDIRECT in the last 72 hours. Thyroid function studies: No results for input(s): TSH, T4TOTAL, T3FREE, THYROIDAB in the last 72 hours.  Invalid input(s): FREET3 Anemia work up: No results for input(s): VITAMINB12, FOLATE, FERRITIN, TIBC, IRON, RETICCTPCT in the last 72 hours. Sepsis Labs: Recent Labs  Lab 05/10/21 1224 05/10/21 2156 05/11/21 0707 05/12/21 0230  WBC 13.0*  --  6.7 7.1  LATICACIDVEN 1.8 1.6  --   --     Microbiology Recent Results (from the past 240 hour(s))  Blood Culture (routine x 2)     Status: None  (Preliminary result)   Collection Time: 05/10/21 12:19 PM   Specimen: BLOOD  Result Value Ref Range Status   Specimen Description   Final    BLOOD BLOOD LEFT ARM Performed at [facility], [address], [place], [address] [place]    Special Requests   Final    BOTTLES DRAWN AEROBIC AND ANAEROBIC Blood Culture results may not be optimal due to an excessive volume of blood received in culture bottles Performed at [facility], [address], [place], [address] [place]    Culture   Final    NO GROWTH 2 DAYS Performed at [facility], [place] [address], [place], [place] [address]    Report Status PENDING  Incomplete  Blood Culture (routine x 2)     Status: None (Preliminary result)   Collection Time: 05/10/21 12:20 PM   Specimen: BLOOD  Result Value Ref Range Status   Specimen Description   Final    BLOOD BLOOD RIGHT ARM Performed at [facility], [address], [place], [place] [address]    Special Requests   Final    BOTTLES DRAWN AEROBIC AND ANAEROBIC Blood Culture adequate volume Performed at [facility], [address], [place], [place] [address]    Culture   Final    NO GROWTH 2 DAYS Performed at [facility], [name] [address], [place], [place] [address]    Report Status PENDING  Incomplete  Urine Culture     Status: Abnormal   Collection Time: 05/10/21  5:20 PM   Specimen: In/Out Cath Urine  Result Value Ref Range Status   Specimen Description   Final    IN/OUT CATH URINE Performed at [facility], [address] [place], [place] [address]    Special Requests   Final    NONE Performed at [facility], [address] [place], [place] [address]    Culture MULTIPLE SPECIES PRESENT, SUGGEST RECOLLECTION (A)  Final   Report Status 05/12/2021 FINAL  Final  Resp Panel by RT-PCR (Flu A&B, Covid)     Status: None   Collection Time: 05/10/21   5:20 PM   Specimen: Nasopharyngeal(NP) swabs in vial transport medium  Result Value Ref Range Status   SARS Coronavirus 2 by RT PCR NEGATIVE NEGATIVE Final    Comment: (NOTE) SARS-CoV-2 target nucleic acids are NOT DETECTED.  The SARS-CoV-2 RNA is generally detectable in upper respiratory specimens during the acute phase of infection. The lowest concentration of SARS-CoV-2 viral copies this assay can detect is 138 copies/mL. A negative result does not preclude SARS-Cov-2 infection and should not be used as the sole basis for treatment or other patient management decisions. A negative result may occur with  improper specimen collection/handling, submission of specimen other than nasopharyngeal swab, presence of viral mutation(s) within the areas targeted by this assay, and inadequate number of viral copies(<138 copies/mL). A negative result must be combined with clinical observations, patient history, and epidemiological information. The expected result is Negative.  Fact Sheet for Patients:  EntrepreneurPulse.com.au  Fact Sheet for Healthcare Providers:  IncredibleEmployment.be  This test is no t yet approved or cleared by the [place] FDA and  has been authorized for detection and/or diagnosis of SARS-CoV-2 by FDA under an Emergency Use Authorization (EUA). This EUA will remain  in effect (meaning this test can be used) for the duration of the COVID-19 declaration under Section 564(b)(1) of the Act, 21 U.S.C.section 360bbb-3(b)(1), unless the authorization is terminated  or revoked sooner.       Influenza A by PCR NEGATIVE NEGATIVE Final   Influenza B by PCR NEGATIVE NEGATIVE Final    Comment: (NOTE) The Xpert Xpress SARS-CoV-2/FLU/RSV plus assay is intended as an aid in the diagnosis of influenza from Nasopharyngeal swab specimens and should not be used as a sole basis for treatment. Nasal washings and aspirates are unacceptable for  Xpert Xpress SARS-CoV-2/FLU/RSV testing.  Fact Sheet for Patients: EntrepreneurPulse.com.au  Fact Sheet for Healthcare Providers: IncredibleEmployment.be  This test is not yet approved or cleared by the [place] FDA and has been authorized for detection and/or  diagnosis of SARS-CoV-2 by FDA under an Emergency Use Authorization (EUA). This EUA will remain in effect (meaning this test can be used) for the duration of the COVID-19 declaration under Section 564(b)(1) of the Act, 21 U.S.C. section 360bbb-3(b)(1), unless the authorization is terminated or revoked.  Performed at [facility], [address], [place], [place] [address]      Medications:    arformoterol  15 mcg Nebulization BID   budesonide  0.25 mg Nebulization BID   dofetilide  250 mcg Oral BID   ezetimibe  10 mg Oral QHS   febuxostat  40 mg Oral QHS   Gerhardt's butt cream   Topical BID   levothyroxine  50 mcg Oral QAC breakfast   losartan  25 mg Oral QHS   magnesium oxide  400 mg Oral QPM   nystatin   Topical TID   polyethylene glycol  17 g Oral BID   rivaroxaban  20 mg Oral Q supper   spironolactone  25 mg Oral Daily   torsemide  20 mg Oral Once per day on Mon Thu   Continuous Infusions:  sodium chloride Stopped (05/10/21 1335)   cefTRIAXone (ROCEPHIN)  IV 1 g (05/12/21 1100)   fluconazole (DIFLUCAN) IV 100 mg (05/12/21 0906)   vancomycin        LOS: 2 days   Charlynne Cousins  [facility]  05/13/2021, 8:05 AM

## 2021-05-13 NOTE — Plan of Care (Signed)
  Problem: Clinical Measurements: Goal: Cardiovascular complication will be avoided Outcome: Progressing   Problem: Activity: Goal: Risk for activity intolerance will decrease Outcome: Progressing   Problem: Coping: Goal: Level of anxiety will decrease Outcome: Not Progressing  Pt doesn't feel ready to discharge

## 2021-05-13 NOTE — Plan of Care (Signed)

## 2021-05-13 NOTE — Progress Notes (Signed)
Mobility Specialist Progress Note   05/13/21 1100  Mobility  Activity Ambulated in hall  Level of Assistance Modified independent, requires aide device or extra time  Assistive Device Four wheel walker  Distance Ambulated (ft) 275 ft  Mobility Ambulated with assistance in hallway  Mobility Response Tolerated well  Mobility performed by Mobility specialist  $Mobility charge 1 Mobility   Received pt in bed having no complaints and agreeable to mobility. Asymptomatic throughout ambulation, returned back to bed w/ call bell by side and all needs met.  Holland Falling Mobility Specialist Phone Number [phone]

## 2021-05-14 ENCOUNTER — Ambulatory Visit: Payer: Medicare Other | Admitting: Internal Medicine

## 2021-05-14 DIAGNOSIS — M10072 Idiopathic gout, left ankle and foot: Secondary | ICD-10-CM

## 2021-05-14 MED ORDER — FLUCONAZOLE 100 MG PO TABS
100.0000 mg | ORAL_TABLET | Freq: Every day | ORAL | Status: DC
  Administered 2021-05-15 – 2021-05-16 (×2): 100 mg via ORAL
  Filled 2021-05-14 (×2): qty 1

## 2021-05-14 MED ORDER — IPRATROPIUM-ALBUTEROL 0.5-2.5 (3) MG/3ML IN SOLN
3.0000 mL | Freq: Four times a day (QID) | RESPIRATORY_TRACT | Status: DC
  Administered 2021-05-14 – 2021-05-15 (×3): 3 mL via RESPIRATORY_TRACT
  Filled 2021-05-14 (×3): qty 3

## 2021-05-14 MED ORDER — ALBUTEROL SULFATE (2.5 MG/3ML) 0.083% IN NEBU: 2.5000 mg | INHALATION_SOLUTION | Freq: Four times a day (QID) | RESPIRATORY_TRACT | Status: DC

## 2021-05-14 MED ORDER — PREDNISONE 20 MG PO TABS
40.0000 mg | ORAL_TABLET | Freq: Every day | ORAL | Status: DC
  Administered 2021-05-14 – 2021-05-16 (×3): 40 mg via ORAL
  Filled 2021-05-14 (×3): qty 2

## 2021-05-14 MED ORDER — GUAIFENESIN ER 600 MG PO TB12
600.0000 mg | ORAL_TABLET | Freq: Two times a day (BID) | ORAL | Status: DC
  Administered 2021-05-14 – 2021-05-16 (×5): 600 mg via ORAL
  Filled 2021-05-14 (×5): qty 1

## 2021-05-14 NOTE — Care Management Important Message (Signed)
Important Message  Patient Details  Name: Kathleen [name] MRN: [id number] Date of Birth: 1946/03/09   Medicare Important Message Given:  Yes     Sherilyn Banker 05/14/2021, 12:05 PM

## 2021-05-14 NOTE — Progress Notes (Signed)
PT Cancellation Note  Patient Details Name: Kathleen [name] MRN: [id number] DOB: January 19, 1946   Cancelled Treatment:    Reason Eval/Treat Not Completed: Other (comment);Medical issues which prohibited therapy.  Pt was nauseated and light headed, up in chair and resting.  Retry as time and pt allow.   Ivar Drape 05/14/2021, 1:12 PM  Samul Dada, PT PhD Acute Rehab Dept. Number: [facility] [id number] and [facility] [phone]

## 2021-05-14 NOTE — TOC Initial Note (Addendum)
Transition of Care [facility]) - Initial/Assessment Note    Patient Details  Name: Kathleen [name] MRN: [id number] Date of Birth: [birth year]  Transition of Care [facility]) CM/SW Contact:    Epifanio Lesches, RN Phone Number: 05/14/2021, 2:00 PM  Clinical Narrative:                Presents with sepsis / chest wall cellulitis under the breasts and in the groin area bilaterally. From home alone, [facility]. States has supportive son that lives close by. PTA independent with ADL's. Oxygen dependent, provider [facility]. Pt active with [facility] , HHPT. States would like to continue using them @ d/c. PT evaluation pending....  TOC team following and assist with needs.  Expected Discharge Plan: Home w Home Health Services Barriers to Discharge: Continued Medical Work up   Patient Goals and CMS Choice     Choice offered to / list presented to : Patient (Pt active with [facility] and would like to continue with their services)  Expected Discharge Plan and Services Expected Discharge Plan: Home w Home Health Services   Discharge Planning Services: CM Consult   Living arrangements for the past 2 months: Single Family Home                           HH Arranged: PT [facility] Agency: [facility] Date [facility] Agency Contacted: 05/14/21 Time HH Agency Contacted: [id number] Representative spoke with at [facility] Agency: [facility]  Prior Living Arrangements/Services Living arrangements for the past 2 months: Single Family Home Lives with:: Self Patient language and need for interpreter reviewed:: Yes Do you feel safe going back to the place where you live?: Yes      Need for Family Participation in Patient Care: Yes (Comment) Care giver support system in place?: Yes (comment) Current home services: DME, Home PT (Home oxygen, [facility]) Criminal Activity/Legal Involvement Pertinent to Current Situation/Hospitalization: No - Comment as needed  Activities of Daily Living Home Assistive  Devices/Equipment: Walker (specify type) ADL Screening (condition at time of admission) Patient's cognitive ability adequate to safely complete daily activities?: Yes Is the patient deaf or have difficulty hearing?: No Does the patient have difficulty seeing, even when wearing glasses/contacts?: No Does the patient have difficulty concentrating, remembering, or making decisions?: No Patient able to express need for assistance with ADLs?: Yes Does the patient have difficulty dressing or bathing?: No Independently performs ADLs?: Yes (appropriate for developmental age) Does the patient have difficulty walking or climbing stairs?: No Weakness of Legs: Both Weakness of Arms/Hands: None  Permission Sought/Granted   Permission granted to share information with : Yes, Verbal Permission Granted  Share Information with NAME: Sherlyn Ebbert (Son) [phone]    Cindi Ghazarian (Son)   [phone]           Emotional Assessment Appearance:: Appears stated age Attitude/Demeanor/Rapport: Engaged Affect (typically observed): Accepting Orientation: : Oriented to Self, Oriented to Place, Oriented to  Time, Oriented to Situation Alcohol / Substance Use: Not Applicable Psych Involvement: No (comment)  Admission diagnosis:  Cellulitis of chest wall [L03.313] Cellulitis of breast [N61.0] Sepsis (HCC) [A41.9] Patient Active Problem List   Diagnosis Date Noted   Cellulitis of chest wall 05/11/2021   Sepsis (HCC) 05/11/2021   Leukocytosis 05/11/2021   Dehydration 05/11/2021   Acute prerenal azotemia 05/11/2021   Cellulitis of breast 05/10/2021   Pneumonia of left lower lobe due to infectious organism 04/22/2021   Age-related  osteoporosis without current pathological fracture 04/22/2021   Type 2 diabetes mellitus with diabetic neuropathy, without long-term current use of insulin (HCC) 04/22/2021   Chronic idiopathic constipation 04/22/2021   Flu vaccine need 04/22/2021   Daytime somnolence  01/22/2021   Sleep apnea 01/22/2021   Constipation    (HFpEF) heart failure with preserved ejection fraction (HCC) 06/26/2019   Chronic systolic heart failure (HCC) 04/06/2018   HTN (hypertension) 04/06/2018   COPD (chronic obstructive pulmonary disease) (HCC) 04/06/2018   Atrial fibrillation (HCC) 04/06/2018   Malnutrition of moderate degree 09/11/2015   PCP:  [name], MD Pharmacy:   [facility] [id number] [place], [place] - [address] AT [facility] [address] & [facility] [address] [place] [address] Phone: [phone] Fax: [phone]  [facility] - [place], [place] - [address] [facility] [employer] [address] [place] [address] Phone: [phone] Fax: [phone]     Social Determinants of Health (SDOH) Interventions    Readmission Risk Interventions Readmission Risk Prevention Plan 06/28/2019  Transportation Screening Complete  PCP or Specialist Appt within 3-5 Days Complete  HRI or Home Care Consult Complete  Palliative Care Screening Not Applicable  Medication Review (RN Care Manager) Complete  Some recent data might be hidden

## 2021-05-14 NOTE — Plan of Care (Signed)
  Problem: Pain Managment: Goal: General experience of comfort will improve Outcome: Progressing   Problem: Safety: Goal: Ability to remain free from injury will improve Outcome: Progressing   Problem: Skin Integrity: Goal: Risk for impaired skin integrity will decrease Outcome: Progressing   

## 2021-05-14 NOTE — Progress Notes (Signed)
[facility]  PROGRESS NOTE  Kathleen [name] [id number] DOB: 07-02-1945 DOA: 05/10/2021 PCP: [name], MD   Brief HPI:   75 year old female with past medical history of persistent atrial fibrillation, sick sinus syndrome, complete heart block status post pacemaker in 2019 on Xarelto, chronic diastolic heart failure, chronic hypoxemic respiratory failure on 2 L of oxygen due to Boop, essential hypertension, history of Candida intertrigo came to hospital with left chest wall cellulitis 4 days prior to admission.    Subjective   Patient states that chest wall cellulitis has significantly improved with antibiotics.  She complains of left ankle swelling and pain.  She has a history of gout.  And she is on febuxostat 40 mg daily at home.   Assessment/Plan:    Sepsis due to extensive chest wall cellulitis -Involving skin under the breasts and groin area/candidal intertrigo -Sepsis physiology has resolved -Antibiotics have been transitioned to p.o. Keflex -Erythema has significantly improved  Gout -Patient has acute gout attack involving the left ankle  -Likely from diuresis/dehydration -We will start prednisone 40 mg daily -Continue febuxostat  Persistent atrial fibrillation -CHA2DS2-VASc score is 5 -Continue Xarelto, Tikosyn  Chronic diastolic heart failure -Last 2D echo in 2022 showed EF of 55% -Patient is euvolemic -We will hold torsemide   Essential hypertension -Blood pressure is stable -Continue Aldactone, losartan    Medications     arformoterol  15 mcg Nebulization BID   budesonide  0.25 mg Nebulization BID   cephALEXin  500 mg Oral Q8H   dofetilide  250 mcg Oral BID   ezetimibe  10 mg Oral QHS   febuxostat  40 mg Oral QHS   [START ON 05/15/2021] fluconazole  100 mg Oral Daily   Gerhardt's butt cream   Topical BID   guaiFENesin  600 mg Oral BID   ipratropium-albuterol  3 mL Nebulization Q6H   levothyroxine  50 mcg Oral QAC breakfast    losartan  25 mg Oral QHS   magnesium oxide  400 mg Oral QPM   nystatin   Topical TID   predniSONE  40 mg Oral Q breakfast   rivaroxaban  20 mg Oral Q supper   spironolactone  25 mg Oral Daily   torsemide  20 mg Oral Once per day on Mon Thu     Data Reviewed:   CBG:  No results for input(s): GLUCAP in the last 168 hours.  SpO2: 94 % O2 Flow Rate (L/min): 4 L/min    Vitals:   05/14/21 0734 05/14/21 1218 05/14/21 1342 05/14/21 1600  BP: 119/62 (!) 134/53  (!) 123/55  Pulse: 68 71  73  Resp: 18 17  17   Temp: 99 F (37.2 C) 98.6 F (37 C)  99.1 F (37.3 C)  TempSrc: Oral Oral  Oral  SpO2: 96% 98% 98% 94%  Weight:      Height:         Intake/Output Summary (Last 24 hours) at 05/14/2021 1714 Last data filed at 05/14/2021 0849 Gross per 24 hour  Intake 240 ml  Output --  Net 240 ml    No intake/output data recorded.  Filed Weights   05/11/21 0453 05/12/21 0355 05/13/21 0615  Weight: 101.1 kg 100.1 kg 100.1 kg    Data Reviewed: Basic Metabolic Panel: Recent Labs  Lab 05/10/21 1224 05/11/21 0707 05/12/21 0230  NA 136 137 135  K 4.0 3.9 3.8  CL 101 104 103  CO2 25 23 26   GLUCOSE 132* 107* 160*  BUN 13 9 8   CREATININE 0.64 0.76 0.71  CALCIUM 10.0 8.6* 8.5*  MG  --  1.9  --    Liver Function Tests: Recent Labs  Lab 05/10/21 1224 05/11/21 0707  AST 9* 11*  ALT 6 8  ALKPHOS 46 48  BILITOT 0.8 0.6  PROT 6.6 5.6*  ALBUMIN 4.1 2.9*   No results for input(s): LIPASE, AMYLASE in the last 168 hours. No results for input(s): AMMONIA in the last 168 hours. CBC: Recent Labs  Lab 05/10/21 1224 05/11/21 0707 05/12/21 0230  WBC 13.0* 6.7 7.1  NEUTROABS 11.0* 4.8  --   HGB 12.2 11.5* 10.9*  HCT 39.3 36.8 34.7*  MCV 81.9 83.4 81.6  PLT 238 190 202   Cardiac Enzymes: No results for input(s): CKTOTAL, CKMB, CKMBINDEX, TROPONINI in the last 168 hours. BNP (last 3 results) Recent Labs    11/07/20 0946 02/13/21 1547  BNP 31.0 28.4    ProBNP (last 3  results) No results for input(s): PROBNP in the last 8760 hours.  CBG: No results for input(s): GLUCAP in the last 168 hours.     Radiology Reports  No results found.     Antibiotics: Anti-infectives (From admission, onward)    Start     Dose/Rate Route Frequency Ordered Stop   05/15/21 1000  fluconazole (DIFLUCAN) tablet 100 mg        100 mg Oral Daily 05/14/21 1607     05/13/21 1000  vancomycin (VANCOCIN) IVPB 1000 mg/200 mL premix  Status:  Discontinued        1,000 mg 200 mL/hr over 60 Minutes Intravenous Every 24 hours 05/13/21 0637 05/13/21 0811   05/13/21 1000  doxycycline (VIBRA-TABS) tablet 100 mg  Status:  Discontinued        100 mg Oral Every 12 hours 05/13/21 0811 05/13/21 0820   05/13/21 0915  cephALEXin (KEFLEX) capsule 500 mg        500 mg Oral Every 8 hours 05/13/21 0820     05/11/21 1000  sulfamethoxazole-trimethoprim (BACTRIM DS) 800-160 MG per tablet 1 tablet  Status:  Discontinued        1 tablet Oral Every 12 hours 05/11/21 0813 05/11/21 0819   05/11/21 0930  vancomycin (VANCOREADY) IVPB 1000 mg/200 mL  Status:  Discontinued        1,000 mg 200 mL/hr over 60 Minutes Intravenous Every 24 hours 05/11/21 0840 05/13/21 0637   05/11/21 0900  doxycycline (VIBRAMYCIN) 100 mg in sodium chloride 0.9 % 250 mL IVPB  Status:  Discontinued        100 mg 125 mL/hr over 120 Minutes Intravenous Every 12 hours 05/11/21 0801 05/11/21 0813   05/11/21 0900  fluconazole (DIFLUCAN) IVPB 100 mg  Status:  Discontinued        100 mg 50 mL/hr over 60 Minutes Intravenous Every 24 hours 05/11/21 0808 05/14/21 1607   05/11/21 0700  cefTRIAXone (ROCEPHIN) 1 g in sodium chloride 0.9 % 100 mL IVPB  Status:  Discontinued        1 g 200 mL/hr over 30 Minutes Intravenous Every 24 hours 05/10/21 2129 05/13/21 0811   05/10/21 1215  cefTRIAXone (ROCEPHIN) 2 g in sodium chloride 0.9 % 100 mL IVPB        2 g 200 mL/hr over 30 Minutes Intravenous  Once 05/10/21 1207 05/10/21 1334          DVT prophylaxis: Xarelto  Code Status: Full code  Family Communication: No family at bedside  Consultants:   Procedures:     Objective    Physical Examination:   General-appears in no acute distress Heart-S1-S2, regular, no murmur auscultated Lungs-bilateral rhonchi auscultated Chest wall-very mild erythema noted on the chest wall Abdomen-soft, nontender, no organomegaly Extremities-left ankle erythema and tenderness noted, trace edema in the foot  Neuro-alert, oriented x3, no focal deficit noted  Status is: Inpatient  Dispo: The patient is from: Home              Anticipated d/c is to: Home              Anticipated d/c date is: 05/15/2021              Patient currently not stable for discharge  Barrier to discharge-ongoing treatment for gout  COVID-19 Labs  No results for input(s): DDIMER, FERRITIN, LDH, CRP in the last 72 hours.  Lab Results  Component Value Date   SARSCOV2NAA NEGATIVE 05/10/2021   [place] NEGATIVE 04/14/2021   [place] NEGATIVE 11/17/2020   [place] NEGATIVE 11/14/2020            Recent Results (from the past 240 hour(s))  Blood Culture (routine x 2)     Status: None (Preliminary result)   Collection Time: 05/10/21 12:19 PM   Specimen: BLOOD  Result Value Ref Range Status   Specimen Description   Final    BLOOD BLOOD LEFT ARM Performed at [facility], [address], [place], [place] [address]    Special Requests   Final    BOTTLES DRAWN AEROBIC AND ANAEROBIC Blood Culture results may not be optimal due to an excessive volume of blood received in culture bottles Performed at [facility], [address], [place], [place] [address]    Culture   Final    NO GROWTH 4 DAYS Performed at [facility], [place] [address], [place], [place] [address]    Report Status PENDING  Incomplete  Blood Culture (routine x 2)     Status: None (Preliminary result)   Collection  Time: 05/10/21 12:20 PM   Specimen: BLOOD  Result Value Ref Range Status   Specimen Description   Final    BLOOD BLOOD RIGHT ARM Performed at [facility], [address], [place], [place] [address]    Special Requests   Final    BOTTLES DRAWN AEROBIC AND ANAEROBIC Blood Culture adequate volume Performed at [facility], [address] [place], [place] [address]    Culture   Final    NO GROWTH 4 DAYS Performed at [facility], [place] [address] [place], [place] [address]    Report Status PENDING  Incomplete  Urine Culture     Status: Abnormal   Collection Time: 05/10/21  5:20 PM   Specimen: In/Out Cath Urine  Result Value Ref Range Status   Specimen Description   Final    IN/OUT CATH URINE Performed at [facility], [address], [place], [place] [address]    Special Requests   Final    NONE Performed at [facility], [address], [place], [place] [address]    Culture MULTIPLE SPECIES PRESENT, SUGGEST RECOLLECTION (A)  Final   Report Status 05/12/2021 FINAL  Final  Resp Panel by RT-PCR (Flu A&B, Covid)     Status: None   Collection Time: 05/10/21  5:20 PM   Specimen: Nasopharyngeal(NP) swabs in vial transport medium  Result Value Ref Range Status   SARS Coronavirus 2 by  RT PCR NEGATIVE NEGATIVE Final    Comment: (NOTE) SARS-CoV-2 target nucleic acids are NOT DETECTED.  The SARS-CoV-2 RNA is generally detectable in upper respiratory specimens during the acute phase of infection. The lowest concentration of SARS-CoV-2 viral copies this assay can detect is 138 copies/mL. A negative result does not preclude SARS-Cov-2 infection and should not be used as the sole basis for treatment or other patient management decisions. A negative result may occur with  improper specimen collection/handling, submission of specimen other than nasopharyngeal swab, presence of viral mutation(s) within  the areas targeted by this assay, and inadequate number of viral copies(<138 copies/mL). A negative result must be combined with clinical observations, patient history, and epidemiological information. The expected result is Negative.  Fact Sheet for Patients:  EntrepreneurPulse.com.au  Fact Sheet for Healthcare Providers:  IncredibleEmployment.be  This test is no t yet approved or cleared by the [place] FDA and  has been authorized for detection and/or diagnosis of SARS-CoV-2 by FDA under an Emergency Use Authorization (EUA). This EUA will remain  in effect (meaning this test can be used) for the duration of the COVID-19 declaration under Section 564(b)(1) of the Act, 21 U.S.C.section 360bbb-3(b)(1), unless the authorization is terminated  or revoked sooner.       Influenza A by PCR NEGATIVE NEGATIVE Final   Influenza B by PCR NEGATIVE NEGATIVE Final    Comment: (NOTE) The Xpert Xpress SARS-CoV-2/FLU/RSV plus assay is intended as an aid in the diagnosis of influenza from Nasopharyngeal swab specimens and should not be used as a sole basis for treatment. Nasal washings and aspirates are unacceptable for Xpert Xpress SARS-CoV-2/FLU/RSV testing.  Fact Sheet for Patients: EntrepreneurPulse.com.au  Fact Sheet for Healthcare Providers: IncredibleEmployment.be  This test is not yet approved or cleared by the [place] FDA and has been authorized for detection and/or diagnosis of SARS-CoV-2 by FDA under an Emergency Use Authorization (EUA). This EUA will remain in effect (meaning this test can be used) for the duration of the COVID-19 declaration under Section 564(b)(1) of the Act, 21 U.S.C. section 360bbb-3(b)(1), unless the authorization is terminated or revoked.  Performed at [facility], [address], [place], [place] [address]     Oswald Hillock   [facility] If 7PM-7AM, please contact night-coverage at [email or web address], Office  [phone]   05/14/2021, 5:14 PM  LOS: 3 days

## 2021-05-14 NOTE — Progress Notes (Signed)
Mobility Specialist Criteria Algorithm Info.  05/14/21 1000  Mobility  Activity Ambulated in room;Transferred:  Bed to chair  Range of Motion/Exercises Active;All extremities  Level of Assistance Contact guard assist, steadying assist  Assistive Device Front wheel walker  Distance Ambulated (ft) 10 ft  Mobility Ambulated with assistance in room;Out of bed to chair with meals  Mobility Response Tolerated well  Mobility performed by Mobility specialist  Bed Position Chair    Patient received lying supine in bed willing to participate in mobility despite feeling nauseated. Agreed upon ambulating short distance to chair. Pt stood and ambulated min guard to recliner chair with slower gait than past session. She complained of 9/10 pain in the ankle of LLE. Ankle is red and pain is present with WB, non-WB (inversion, eversion, plantar and dorsiflexion), and palpitation. She stated she has history of gout and has concerns of possible flair. RN notified. Pt was left in recliner chair with all needs met and RN present.    05/14/2021 10:19 AM

## 2021-05-15 LAB — CULTURE, BLOOD (ROUTINE X 2)
Culture: NO GROWTH
Culture: NO GROWTH
Special Requests: ADEQUATE

## 2021-05-15 LAB — SARS CORONAVIRUS 2 (TAT 6-24 HRS): SARS Coronavirus 2: NEGATIVE

## 2021-05-15 MED ORDER — ACETYLCYSTEINE 20 % IN SOLN
4.0000 mL | Freq: Once | RESPIRATORY_TRACT | Status: AC
  Administered 2021-05-15: 4 mL via RESPIRATORY_TRACT
  Filled 2021-05-15: qty 4

## 2021-05-15 MED ORDER — TORSEMIDE 20 MG PO TABS
20.0000 mg | ORAL_TABLET | Freq: Every day | ORAL | Status: DC
  Administered 2021-05-15 – 2021-05-16 (×2): 20 mg via ORAL
  Filled 2021-05-15 (×2): qty 1

## 2021-05-15 MED ORDER — WHITE PETROLATUM EX OINT: TOPICAL_OINTMENT | CUTANEOUS | Status: DC | PRN

## 2021-05-15 MED ORDER — ALBUTEROL SULFATE (2.5 MG/3ML) 0.083% IN NEBU
2.5000 mg | INHALATION_SOLUTION | Freq: Once | RESPIRATORY_TRACT | Status: AC
  Administered 2021-05-15: 2.5 mg via RESPIRATORY_TRACT
  Filled 2021-05-15: qty 3

## 2021-05-15 MED ORDER — POTASSIUM CHLORIDE CRYS ER 20 MEQ PO TBCR
20.0000 meq | EXTENDED_RELEASE_TABLET | Freq: Once | ORAL | Status: AC
Start: 2021-05-15 — End: 2021-05-15
  Administered 2021-05-15: 20 meq via ORAL
  Filled 2021-05-15: qty 1

## 2021-05-15 MED ORDER — BENZONATATE 100 MG PO CAPS
200.0000 mg | ORAL_CAPSULE | Freq: Two times a day (BID) | ORAL | Status: DC | PRN
  Administered 2021-05-15 (×2): 200 mg via ORAL
  Filled 2021-05-15 (×2): qty 2

## 2021-05-15 NOTE — Progress Notes (Signed)
[facility]  PROGRESS NOTE  Kathleen [name] [id number] DOB: 1945/11/23 DOA: 05/10/2021 PCP: [name], MD   Brief HPI:   75 year old female with past medical history of persistent atrial fibrillation, sick sinus syndrome, complete heart block status post pacemaker in 2019 on Xarelto, chronic diastolic heart failure, chronic hypoxemic respiratory failure on 2 L of oxygen due to Boop, essential hypertension, history of Candida intertrigo came to hospital with left chest wall cellulitis 4 days prior to admission.    Subjective   Coughing this morning, left foot gout has significantly improved.   Assessment/Plan:    Sepsis due to extensive chest wall cellulitis -Involving skin under the breasts and groin area/candidal intertrigo -Sepsis physiology has resolved -Antibiotics have been transitioned to p.o. Keflex -Erythema has significantly improved  Gout -Patient has acute gout attack involving the left ankle  -Likely from diuresis/dehydration -Significantly improved after starting prednisone 40 mg daily -Continue febuxostat  Persistent atrial fibrillation -CHA2DS2-VASc score is 5 -Continue Xarelto, Tikosyn  Cough -Likely from underlying bronchitis -Patient said that she was exposed to her  roommate who turned out to be COVID-positive before she came to hospital -Initial COVID-19 test was negative on Saturday -Repeat COVID-19 RT-PCR -Start Tessalon Perles 200 mg p.o. twice daily as needed -1 dose of Mucomyst nebulizer -Humidifier in the room  Chronic diastolic heart failure -Last 2D echo in 2022 showed EF of 55% -Patient is euvolemic -Started back on torsemide at lower dose of 20 mg p.o. daily   Essential hypertension -Blood pressure is stable -Continue Aldactone, losartan    Medications     acetylcysteine  4 mL Nebulization Once   arformoterol  15 mcg Nebulization BID   budesonide  0.25 mg Nebulization BID   cephALEXin  500 mg Oral Q8H    dofetilide  250 mcg Oral BID   ezetimibe  10 mg Oral QHS   febuxostat  40 mg Oral QHS   fluconazole  100 mg Oral Daily   Gerhardt's butt cream   Topical BID   guaiFENesin  600 mg Oral BID   levothyroxine  50 mcg Oral QAC breakfast   losartan  25 mg Oral QHS   magnesium oxide  400 mg Oral QPM   nystatin   Topical TID   predniSONE  40 mg Oral Q breakfast   rivaroxaban  20 mg Oral Q supper   spironolactone  25 mg Oral Daily   torsemide  20 mg Oral Daily     Data Reviewed:   CBG:  No results for input(s): GLUCAP in the last 168 hours.  SpO2: 99 % O2 Flow Rate (L/min): 4 L/min    Vitals:   05/15/21 0500 05/15/21 0835 05/15/21 0848 05/15/21 0849  BP:  (!) 149/73    Pulse:  76    Resp:  18    Temp:  98.4 F (36.9 C)    TempSrc:  Oral    SpO2:  100% 99% 99%  Weight: 99.7 kg     Height:        No intake or output data in the 24 hours ending 05/15/21 1209   12/06 1901 - 12/08 0700 In: 240 [P.O.:240] Out: -   Filed Weights   05/12/21 0355 05/13/21 0615 05/15/21 0500  Weight: 100.1 kg 100.1 kg 99.7 kg    Data Reviewed: Basic Metabolic Panel: Recent Labs  Lab 05/10/21 1224 05/11/21 0707 05/12/21 0230  NA 136 137 135  K 4.0 3.9 3.8  CL 101 104 103  CO2 25 23 26   GLUCOSE 132* 107* 160*  BUN 13 9 8   CREATININE 0.64 0.76 0.71  CALCIUM 10.0 8.6* 8.5*  MG  --  1.9  --    Liver Function Tests: Recent Labs  Lab 05/10/21 1224 05/11/21 0707  AST 9* 11*  ALT 6 8  ALKPHOS 46 48  BILITOT 0.8 0.6  PROT 6.6 5.6*  ALBUMIN 4.1 2.9*   No results for input(s): LIPASE, AMYLASE in the last 168 hours. No results for input(s): AMMONIA in the last 168 hours. CBC: Recent Labs  Lab 05/10/21 1224 05/11/21 0707 05/12/21 0230  WBC 13.0* 6.7 7.1  NEUTROABS 11.0* 4.8  --   HGB 12.2 11.5* 10.9*  HCT 39.3 36.8 34.7*  MCV 81.9 83.4 81.6  PLT 238 190 202   Cardiac Enzymes: No results for input(s): CKTOTAL, CKMB, CKMBINDEX, TROPONINI in the last 168 hours. BNP (last 3  results) Recent Labs    11/07/20 0946 02/13/21 1547  BNP 31.0 28.4    ProBNP (last 3 results) No results for input(s): PROBNP in the last 8760 hours.  CBG: No results for input(s): GLUCAP in the last 168 hours.     Radiology Reports  No results found.     Antibiotics: Anti-infectives (From admission, onward)    Start     Dose/Rate Route Frequency Ordered Stop   05/15/21 1000  fluconazole (DIFLUCAN) tablet 100 mg        100 mg Oral Daily 05/14/21 1607     05/13/21 1000  vancomycin (VANCOCIN) IVPB 1000 mg/200 mL premix  Status:  Discontinued        1,000 mg 200 mL/hr over 60 Minutes Intravenous Every 24 hours 05/13/21 0637 05/13/21 0811   05/13/21 1000  doxycycline (VIBRA-TABS) tablet 100 mg  Status:  Discontinued        100 mg Oral Every 12 hours 05/13/21 0811 05/13/21 0820   05/13/21 0915  cephALEXin (KEFLEX) capsule 500 mg        500 mg Oral Every 8 hours 05/13/21 0820     05/11/21 1000  sulfamethoxazole-trimethoprim (BACTRIM DS) 800-160 MG per tablet 1 tablet  Status:  Discontinued        1 tablet Oral Every 12 hours 05/11/21 0813 05/11/21 0819   05/11/21 0930  vancomycin (VANCOREADY) IVPB 1000 mg/200 mL  Status:  Discontinued        1,000 mg 200 mL/hr over 60 Minutes Intravenous Every 24 hours 05/11/21 0840 05/13/21 0637   05/11/21 0900  doxycycline (VIBRAMYCIN) 100 mg in sodium chloride 0.9 % 250 mL IVPB  Status:  Discontinued        100 mg 125 mL/hr over 120 Minutes Intravenous Every 12 hours 05/11/21 0801 05/11/21 0813   05/11/21 0900  fluconazole (DIFLUCAN) IVPB 100 mg  Status:  Discontinued        100 mg 50 mL/hr over 60 Minutes Intravenous Every 24 hours 05/11/21 0808 05/14/21 1607   05/11/21 0700  cefTRIAXone (ROCEPHIN) 1 g in sodium chloride 0.9 % 100 mL IVPB  Status:  Discontinued        1 g 200 mL/hr over 30 Minutes Intravenous Every 24 hours 05/10/21 2129 05/13/21 0811   05/10/21 1215  cefTRIAXone (ROCEPHIN) 2 g in sodium chloride 0.9 % 100 mL IVPB         2 g 200 mL/hr over 30 Minutes Intravenous  Once 05/10/21 1207 05/10/21 1334         DVT prophylaxis: Xarelto  Code Status:  Full code  Family Communication: No family at bedside   Consultants:   Procedures:     Objective    Physical Examination:   General-appears in no acute distress Heart-S1-S2, regular, no murmur auscultated Lungs-scattered rhonchi bilaterally Abdomen-soft, nontender, no organomegaly Extremities-no edema in the lower extremities Neuro-alert, oriented x3, no focal deficit noted  Status is: Inpatient  Dispo: The patient is from: Home              Anticipated d/c is to: Home              Anticipated d/c date is: 05/16/2021              Patient currently not stable for discharge  Barrier to discharge-ongoing treatment for gout  COVID-19 Labs  No results for input(s): DDIMER, FERRITIN, LDH, CRP in the last 72 hours.  Lab Results  Component Value Date   SARSCOV2NAA NEGATIVE 05/10/2021   SARSCOV2NAA NEGATIVE 04/14/2021   SARSCOV2NAA NEGATIVE 11/17/2020   SARSCOV2NAA NEGATIVE 11/14/2020            Recent Results (from the past 240 hour(s))  Blood Culture (routine x 2)     Status: None   Collection Time: 05/10/21 12:19 PM   Specimen: BLOOD  Result Value Ref Range Status   Specimen Description   Final    BLOOD BLOOD LEFT ARM Performed at [facility], [address], [place], [place] [address]    Special Requests   Final    BOTTLES DRAWN AEROBIC AND ANAEROBIC Blood Culture results may not be optimal due to an excessive volume of blood received in culture bottles Performed at [facility], [address] [place], [place] [address]    Culture   Final    NO GROWTH 5 DAYS Performed at [facility], [address] [place], [place] [address]    Report Status 05/15/2021 FINAL  Final  Blood Culture (routine x 2)     Status: None   Collection Time: 05/10/21 12:20 PM   Specimen: BLOOD   Result Value Ref Range Status   Specimen Description   Final    BLOOD BLOOD RIGHT ARM Performed at [facility], [address] [place], [place] [address]    Special Requests   Final    BOTTLES DRAWN AEROBIC AND ANAEROBIC Blood Culture adequate volume Performed at [facility], [address], [place], [place] [address]    Culture   Final    NO GROWTH 5 DAYS Performed at [facility], [address], [place], [place] [address]    Report Status 05/15/2021 FINAL  Final  Urine Culture     Status: Abnormal   Collection Time: 05/10/21  5:20 PM   Specimen: In/Out Cath Urine  Result Value Ref Range Status   Specimen Description   Final    IN/OUT CATH URINE Performed at [facility], [address], [place], [place] [address]    Special Requests   Final    NONE Performed at [facility], [address] [place], [place] [address]    Culture MULTIPLE SPECIES PRESENT, SUGGEST RECOLLECTION (A)  Final   Report Status 05/12/2021 FINAL  Final  Resp Panel by RT-PCR (Flu A&B, Covid)     Status: None   Collection Time: 05/10/21  5:20 PM   Specimen: Nasopharyngeal(NP) swabs in vial transport medium  Result Value Ref Range Status   SARS Coronavirus 2 by RT PCR NEGATIVE NEGATIVE Final  Comment: (NOTE) SARS-CoV-2 target nucleic acids are NOT DETECTED.  The SARS-CoV-2 RNA is generally detectable in upper respiratory specimens during the acute phase of infection. The lowest concentration of SARS-CoV-2 viral copies this assay can detect is 138 copies/mL. A negative result does not preclude SARS-Cov-2 infection and should not be used as the sole basis for treatment or other patient management decisions. A negative result may occur with  improper specimen collection/handling, submission of specimen other than nasopharyngeal swab, presence of viral mutation(s) within the areas targeted by this assay, and  inadequate number of viral copies(<138 copies/mL). A negative result must be combined with clinical observations, patient history, and epidemiological information. The expected result is Negative.  Fact Sheet for Patients:  EntrepreneurPulse.com.au  Fact Sheet for Healthcare Providers:  IncredibleEmployment.be  This test is no t yet approved or cleared by the [place] FDA and  has been authorized for detection and/or diagnosis of SARS-CoV-2 by FDA under an Emergency Use Authorization (EUA). This EUA will remain  in effect (meaning this test can be used) for the duration of the COVID-19 declaration under Section 564(b)(1) of the Act, 21 U.S.C.section 360bbb-3(b)(1), unless the authorization is terminated  or revoked sooner.       Influenza A by PCR NEGATIVE NEGATIVE Final   Influenza B by PCR NEGATIVE NEGATIVE Final    Comment: (NOTE) The Xpert Xpress SARS-CoV-2/FLU/RSV plus assay is intended as an aid in the diagnosis of influenza from Nasopharyngeal swab specimens and should not be used as a sole basis for treatment. Nasal washings and aspirates are unacceptable for Xpert Xpress SARS-CoV-2/FLU/RSV testing.  Fact Sheet for Patients: EntrepreneurPulse.com.au  Fact Sheet for Healthcare Providers: IncredibleEmployment.be  This test is not yet approved or cleared by the [place] FDA and has been authorized for detection and/or diagnosis of SARS-CoV-2 by FDA under an Emergency Use Authorization (EUA). This EUA will remain in effect (meaning this test can be used) for the duration of the COVID-19 declaration under Section 564(b)(1) of the Act, 21 U.S.C. section 360bbb-3(b)(1), unless the authorization is terminated or revoked.  Performed at [facility], [address], [place], [place] [address]     Oswald Hillock   [facility] If 7PM-7AM, please contact  night-coverage at [email or web address], Office  [phone]   05/15/2021, 12:09 PM  LOS: 4 days

## 2021-05-15 NOTE — Progress Notes (Addendum)
Physical Therapy Treatment Patient Details Name: Kathleen [name] MRN: [id number] DOB: Oct 29, 1945 Today's Date: 05/15/2021   History of Present Illness Patient is a 75 y/o female who presents from [facility] on 12/4 due to chest wall erythema and drainage. Found to have sepsis due to chest wall cellulitis. CXR-bilbasiliar atelectasis and bil pleural effusions. PMH includes respiratory failure on home 02, combined CHF, COPD, asthma, OSA, BOOP, complete heart block, A-flutter, HTN, nonischemic cardiomyopathy.    PT Comments    Pt admitted with above diagnosis. Pt was able to ambulate with RW with good safety overall.  Pt also up and down steps with min guard assist. Pt progressing.  Pt currently with functional limitations due to balance and endurance deficits. Pt will benefit from skilled PT to increase their independence and safety with mobility to allow discharge to the venue listed below.      Recommendations for follow up therapy are one component of a multi-disciplinary discharge planning process, led by the attending physician.  Recommendations may be updated based on patient status, additional functional criteria and insurance authorization.  Follow Up Recommendations  Home health PT (pending improvement)     Assistance Recommended at Discharge Intermittent Supervision/Assistance  Equipment Recommendations  None recommended by PT    Recommendations for Other Services       Precautions / Restrictions Precautions Precautions: Fall Restrictions Weight Bearing Restrictions: No     Mobility  Bed Mobility               General bed mobility comments: Pt in bathroom on arrival wihtout O2 with sats 87% on RA. Replaced O2 when she came out of bathroom at 4L and sats up to 92% rather quickly    Transfers Overall transfer level: Needs assistance Equipment used: None Transfers: Sit to/from Stand Sit to Stand: Min guard           General transfer comment: Min guard for  safety    Ambulation/Gait Ambulation/Gait assistance: Min guard Gait Distance (Feet): 250 Feet Assistive device: Rolling walker (2 wheels) Gait Pattern/deviations: Step-through pattern;Decreased stride length;Wide base of support Gait velocity: decreased Gait velocity interpretation: <1.31 ft/sec, indicative of household ambulator   General Gait Details: Slow, guarded gait holding onto rail at times, a few standing rest breaks. 2/4 DOE. Sp02 remained >93% on 4L/min 02 [place].    Wheelchair Mobility    Modified Rankin (Stroke Patients Only)       Balance Overall balance assessment: Needs assistance Sitting-balance support: Feet supported;No upper extremity supported Sitting balance-Leahy Scale: Good     Standing balance support: During functional activity Standing balance-Leahy Scale: Fair Standing balance comment: Able to stand statically without UE support but needs UE support PRN with walking.                            Cognition Arousal/Alertness: Awake/alert Behavior During Therapy: WFL for tasks assessed/performed Overall Cognitive Status: Within Functional Limits for tasks assessed                                          Exercises General Exercises - Lower Extremity Long Arc Quad: AROM;Both;10 reps;Seated    General Comments General comments (skin integrity, edema, etc.): SpO2 87% on RA and 92% with 4LO2      Pertinent Vitals/Pain Pain Assessment: Faces Faces Pain Scale: Hurts even more  Pain Location: bil shoulders Pain Descriptors / Indicators: Sore;Aching Pain Intervention(s): Limited activity within patient's tolerance;Monitored during session;Repositioned    Home Living                          Prior Function            PT Goals (current goals can now be found in the care plan section) Acute Rehab PT Goals Patient Stated Goal: to get better Progress towards PT goals: Progressing toward goals     Frequency    Min 3X/week      PT Plan Current plan remains appropriate    Co-evaluation              AM-PAC PT "6 Clicks" Mobility   Outcome Measure  Help needed turning from your back to your side while in a flat bed without using bedrails?: A Little Help needed moving from lying on your back to sitting on the side of a flat bed without using bedrails?: A Little Help needed moving to and from a bed to a chair (including a wheelchair)?: A Little Help needed standing up from a chair using your arms (e.g., wheelchair or bedside chair)?: A Little Help needed to walk in hospital room?: A Little Help needed climbing 3-5 steps with a railing? : A Little 6 Click Score: 18    End of Session Equipment Utilized During Treatment: Oxygen;Gait belt Activity Tolerance: Patient tolerated treatment well Patient left: in chair;with call bell/phone within reach Nurse Communication: Mobility status PT Visit Diagnosis: Muscle weakness (generalized) (M62.81);Difficulty in walking, not elsewhere classified (R26.2)     Time: [id number] PT Time Calculation (min) (ACUTE ONLY): 21 min  Charges:  $Gait Training: 8-22 mins                     Kanyon Bunn M,PT Acute Rehab Services [phone] (pager)    Alvira Philips 05/15/2021, 1:04 PM

## 2021-05-15 NOTE — Progress Notes (Signed)
  Mobility Specialist Criteria Algorithm Info.   05/15/21 1725  Mobility  Activity Ambulated in hall;Dangled on edge of bed  Range of Motion/Exercises Active;All extremities  Level of Assistance Standby assist, set-up cues, supervision of patient - no hands on  Assistive Device Front wheel walker  Distance Ambulated (ft) 240 ft  Mobility Ambulated with assistance in hallway  Mobility Response Tolerated well  Mobility performed by Mobility specialist  Bed Position Semi-fowlers   Patient received dangling EOB eager to participate in mobility. Ambulated in hallway with slow steady gait. Returned to room without incident or complaint and was left dangling EOB with all needs met.   05/15/2021 5:25 PM

## 2021-05-16 ENCOUNTER — Telehealth: Payer: Self-pay

## 2021-05-16 LAB — BASIC METABOLIC PANEL
Anion gap: 10 (ref 5–15)
BUN: 19 mg/dL (ref 8–23)
CO2: 32 mmol/L (ref 22–32)
Calcium: 9.9 mg/dL (ref 8.9–10.3)
Chloride: 93 mmol/L — ABNORMAL LOW (ref 98–111)
Creatinine, Ser: 1.07 mg/dL — ABNORMAL HIGH (ref 0.44–1.00)
GFR, Estimated: 55 mL/min — ABNORMAL LOW (ref >60)
Glucose, Bld: 189 mg/dL — ABNORMAL HIGH (ref 70–99)
Potassium: 4.5 mmol/L (ref 3.5–5.1)
Sodium: 135 mmol/L (ref 135–145)

## 2021-05-16 LAB — CBC
HCT: 35.2 % — ABNORMAL LOW (ref 36.0–46.0)
Hemoglobin: 10.8 g/dL — ABNORMAL LOW (ref 12.0–15.0)
MCH: 25.2 pg — ABNORMAL LOW (ref 26.0–34.0)
MCHC: 30.7 g/dL (ref 30.0–36.0)
MCV: 82.2 fL (ref 80.0–100.0)
Platelets: 268 10*3/uL (ref 150–400)
RBC: 4.28 MIL/uL (ref 3.87–5.11)
RDW: 14.6 % (ref 11.5–15.5)
WBC: 12.3 10*3/uL — ABNORMAL HIGH (ref 4.0–10.5)
nRBC: 0 % (ref 0.0–0.2)

## 2021-05-16 MED ORDER — NYSTATIN 100000 UNIT/GM EX POWD: Freq: Three times a day (TID) | CUTANEOUS | 0 refills | Status: DC | PRN

## 2021-05-16 MED ORDER — BENZONATATE 200 MG PO CAPS
200.0000 mg | ORAL_CAPSULE | Freq: Two times a day (BID) | ORAL | 0 refills | Status: DC | PRN
Start: 2021-05-16 — End: 2021-05-23

## 2021-05-16 MED ORDER — GUAIFENESIN ER 600 MG PO TB12
600.0000 mg | ORAL_TABLET | Freq: Two times a day (BID) | ORAL | 0 refills | Status: DC
Start: 2021-05-16 — End: 2021-06-24

## 2021-05-16 MED ORDER — CEPHALEXIN 500 MG PO CAPS: 500.0000 mg | ORAL_CAPSULE | Freq: Three times a day (TID) | ORAL | 0 refills | Status: DC

## 2021-05-16 MED ORDER — PREDNISONE 10 MG PO TABS: 10.0000 mg | ORAL_TABLET | Freq: Every day | ORAL | 0 refills | Status: DC

## 2021-05-16 NOTE — Progress Notes (Signed)
  Mobility Specialist Criteria Algorithm Info.   05/16/21 1100  Oxygen Therapy  SpO2 95 %  O2 Device Nasal Cannula  Mobility  Activity Ambulated in hall;Dangled on edge of bed  Range of Motion/Exercises Active;All extremities  Level of Assistance Standby assist, set-up cues, supervision of patient - no hands on  Assistive Device Front wheel walker  Distance Ambulated (ft) 490 ft  Mobility Ambulated with assistance in hallway  Mobility Response Tolerated well  Mobility performed by Mobility specialist  Bed Position Semi-fowlers   Patient received lying in bed eager to participate with anticipation to be discharged soon. Upon standing and ambulating short distance in room pt c/o dizziness, she explained is chronic and dissipated quickly. Ambulated in hallway supervision level with slow steady gait. Required standing rest break x2 secondary to coughing spell. Returned to room without complaint or incident. Was left with all needs met and NT present.  05/16/2021 12:10 PM

## 2021-05-16 NOTE — Progress Notes (Signed)
Notified Lama MD via secure chat of increase in WBC and creatinine over night.

## 2021-05-16 NOTE — Telephone Encounter (Signed)
Transition Care Management Follow-up Telephone Call Date of discharge and from where: 05/16/2021 from [facility] Diagnosis: Cellulitis of chest wall, Sepsis How have you been since you were released from the hospital? "Doing fine" Any questions or concerns? No  Items Reviewed: Did the pt receive and understand the discharge instructions provided? Yes  Medications obtained and verified? Yes  Other? No  Any new allergies since your discharge? No  Dietary orders reviewed? No Do you have support at home? Yes , sons  Home Care and Equipment/Supplies: Were home health services ordered? yes If so, what is the name of the agency? [facility]  Has the agency set up a time to come to the patient's home? no Were any new equipment or medical supplies ordered?  No What is the name of the medical supply agency? N/a Were you able to get the supplies/equipment? no Do you have any questions related to the use of the equipment or supplies? No  Functional Questionnaire: (I = Independent and D = Dependent) ADLs: I  Bathing/Dressing- I  Meal Prep- I  Eating- I  Maintaining continence- I  Transferring/Ambulation- I  Managing Meds- I  Follow up appointments reviewed:  PCP Hospital f/u appt confirmed? Yes  Scheduled to see Sanda Linger, MD on 05/23/2021 @ 9:00 am . [facility] f/u appt confirmed? Yes  Scheduled to see Hillis Range, MD on 05/23/2021 @ 10:00 am. Are transportation arrangements needed? No  If their condition worsens, is the pt aware to call PCP or go to the Emergency Dept.? Yes Was the patient provided with contact information for the PCP's office or ED? Yes Was to pt encouraged to call back with questions or concerns? Yes

## 2021-05-16 NOTE — Discharge Summary (Signed)
Physician Discharge Summary  Kathleen [name] [id number] DOB: December 12, 1945 DOA: 05/10/2021  PCP: [name], MD  Admit date: 05/10/2021 Discharge date: 05/16/2021  Time spent: 60 minutes  Recommendations for Outpatient Follow-up:  Follow-up PCP in 1 week  Discharge Diagnoses:  Principal Problem:   Cellulitis of chest wall Active Problems:   HTN (hypertension)   Atrial fibrillation (HCC)   (HFpEF) heart failure with preserved ejection fraction (HCC)   Sepsis (HCC)   Leukocytosis   Dehydration   Acute prerenal azotemia   Discharge Condition: Stable  Diet recommendation: Regular diet  Filed Weights   05/13/21 0615 05/15/21 0500 05/16/21 0500  Weight: 100.1 kg 99.7 kg 98.9 kg    History of present illness:  75 year old female with past medical history of persistent atrial fibrillation, sick sinus syndrome, complete heart block status post pacemaker in 2019 on Xarelto, chronic diastolic heart failure, chronic hypoxemic respiratory failure on 2 L of oxygen due to [place], essential hypertension, history of Candida intertrigo came to hospital with left chest wall cellulitis 4 days prior to admission.    Hospital Course:   Sepsis due to extensive chest wall cellulitis -Involving skin under the breasts and groin area/candidal intertrigo -Sepsis physiology has resolved -Antibiotics have been transitioned to p.o. Keflex  Chest wall cellulitis -Erythema has significantly improved -We will discharge on p.o. Keflex for 2 more days to complete 5 days of therapy  Candida/intertrigo -Nystatin powder 3 times daily as needed   Gout -Patient has acute gout attack involving the left ankle  -Likely from diuresis/dehydration -Significantly improved after starting prednisone 40 mg daily -We will discharge on prednisone taper for 4 more days -Continue febuxostat   Persistent atrial fibrillation -CHA2DS2-VASc score is 5 -Continue Xarelto, Tikosyn   Cough -Likely from underlying  bronchitis; significantly improved -Patient said that she was exposed to her  roommate who turned out to be COVID-positive before she came to hospital -Initial COVID-19 test was negative on Saturday -Repeat COVID-19 RT-PCR -Started on Tessalon Perles 200 mg p.o. twice daily as needed    Chronic diastolic heart failure -Last 2D echo in 2022 showed EF of 55% -Patient is euvolemic -Started back on torsemide  -Continue home oxygen 4 L/min     Essential hypertension -Blood pressure is stable -Continue Aldactone, losartan   Leukocytosis -She is afebrile, likely from steroids  Procedures:   Consultations:   Discharge Exam: Vitals:   05/16/21 0557 05/16/21 0812  BP: (!) 143/63 (!) 145/68  Pulse: 71 72  Resp: 20 17  Temp: 97.6 F (36.4 C) 98 F (36.7 C)  SpO2: 100% 97%    General: Appears in no acute distress Cardiovascular: S1-S2, regular, no murmur auscultated Respiratory: Clear to auscultation bilaterally  Discharge Instructions   Discharge Instructions     Diet - low sodium heart healthy   Complete by: As directed    Increase activity slowly   Complete by: As directed       Allergies as of 05/16/2021       Reactions   Allopurinol Other (See Comments)   Reaction:  Dizziness    Clindamycin Anaphylaxis, Hives   Pneumococcal 13-val Conj Vacc Itching, Swelling, Rash   Dronedarone Rash   Entresto [sacubitril-valsartan] Other (See Comments)   hypotension   Fosamax [alendronate Sodium] Nausea Only   Meperidine Nausea And Vomiting   Rosuvastatin Other (See Comments)   Reaction:  Muscle spasms    Tetracycline Hives   Adhesive [tape] Rash   Lovastatin Rash   Other  reaction(s): Muscle Pain        Medication List     STOP taking these medications    ketoconazole 2 % cream Commonly known as: NIZORAL       TAKE these medications    acetaminophen 650 MG CR tablet Commonly known as: TYLENOL Take 1,300 mg by mouth every 8 (eight) hours as needed for  pain (take with budesonide).   albuterol 108 (90 Base) MCG/ACT inhaler Commonly known as: VENTOLIN HFA Inhale 2 puffs into the lungs every 4 (four) hours as needed for wheezing or shortness of breath.   arformoterol 15 MCG/2ML Nebu Commonly known as: BROVANA Take 2 mLs (15 mcg total) by nebulization 2 (two) times daily.   azelastine 0.1 % nasal spray Commonly known as: ASTELIN Place 2 sprays into both nostrils 2 (two) times daily.   benzonatate 200 MG capsule Commonly known as: TESSALON Take 1 capsule (200 mg total) by mouth 2 (two) times daily as needed for cough.   budesonide 0.25 MG/2ML nebulizer solution Commonly known as: Pulmicort Take 2 mLs (0.25 mg total) by nebulization 2 (two) times daily.   cephALEXin 500 MG capsule Commonly known as: KEFLEX Take 1 capsule (500 mg total) by mouth every 8 (eight) hours.   cyanocobalamin 1000 MCG/ML injection Commonly known as: (VITAMIN B-12) Inject 1 mL (1,000 mcg total) into the muscle every 30 (thirty) days.   docusate sodium 100 MG capsule Commonly known as: COLACE Take 100 mg by mouth 2 (two) times daily.   dofetilide 250 MCG capsule Commonly known as: TIKOSYN Take 250 mcg by mouth in the morning and at bedtime.   ezetimibe 10 MG tablet Commonly known as: ZETIA Take 10 mg by mouth at bedtime.   febuxostat 40 MG tablet Commonly known as: ULORIC Take 40 mg by mouth at bedtime.   guaiFENesin 600 MG 12 hr tablet Commonly known as: MUCINEX Take 1 tablet (600 mg total) by mouth 2 (two) times daily.   ipratropium-albuterol 0.5-2.5 (3) MG/3ML Soln Commonly known as: DUONEB Inhale 3 mLs into the lungs every 6 (six) hours as needed (wheezing/shortness of breath).   levothyroxine 50 MCG tablet Commonly known as: SYNTHROID Take 50 mcg by mouth daily before breakfast.   linaclotide 145 MCG Caps capsule Commonly known as: Linzess Take 1 capsule (145 mcg total) by mouth daily before breakfast. What changed:  when to take  this reasons to take this   losartan 25 MG tablet Commonly known as: COZAAR Take 1 tablet (25 mg total) by mouth at bedtime.   Magnesium 500 MG Tabs Take 500 mg by mouth at bedtime.   nystatin powder Commonly known as: MYCOSTATIN/NYSTOP Apply topically 3 (three) times daily as needed (Rash under breast).   ondansetron 4 MG tablet Commonly known as: ZOFRAN Take 1 tablet (4 mg total) by mouth every 8 (eight) hours as needed for nausea or vomiting.   OXYGEN Inhale 4 L into the lungs continuous. Use with resmed ventilator   predniSONE 10 MG tablet Commonly known as: DELTASONE Take 1 tablet (10 mg total) by mouth daily. Prednisone 40 mg po daily x 1 day then Prednisone 30 mg po daily x 1 day then Prednisone 20 mg po daily x 1 day then Prednisone 10 mg daily x 1 day then stop...   rivaroxaban 20 MG Tabs tablet Commonly known as: XARELTO Take 20 mg by mouth daily with supper.   Rybelsus 3 MG Tabs Generic drug: Semaglutide Take 3 mg by mouth daily.  spironolactone 25 MG tablet Commonly known as: ALDACTONE Take 25 mg by mouth every morning.   torsemide 20 MG tablet Commonly known as: DEMADEX Take 1 tablet (20 mg total) by mouth 2 (two) times a week. Take extra tab for weight 222 lbs or greater What changed:  when to take this reasons to take this additional instructions   traMADol 50 MG tablet Commonly known as: ULTRAM Take 50 mg by mouth every 6 (six) hours as needed for moderate pain.   Vitamin D3 50 MCG (2000 UT) Tabs Take 2,000 Units by mouth at bedtime.   Yupelri 175 MCG/3ML nebulizer solution Generic drug: revefenacin USE 1 VIAL IN NEBULIZER DAILY - DO NOT MIX WITH OTHER NEB MEDS,USE BEFORE OR AFTER What changed: See the new instructions.       Allergies  Allergen Reactions   Allopurinol Other (See Comments)    Reaction:  Dizziness    Clindamycin Anaphylaxis and Hives   Pneumococcal 13-Val Conj Vacc Itching, Swelling and Rash   Dronedarone Rash    Entresto [Sacubitril-Valsartan] Other (See Comments)    hypotension   Fosamax [Alendronate Sodium] Nausea Only   Meperidine Nausea And Vomiting   Rosuvastatin Other (See Comments)    Reaction:  Muscle spasms    Tetracycline Hives   Adhesive [Tape] Rash   Lovastatin Rash    Other reaction(s): Muscle Pain    Follow-up Information     [facility]   Specialty: [place] Why: Resumption of home health PT services will be provided by [facility] information: [address] [place] [address] [phone]         [name], MD Follow up in 1 week(s).   Specialty: Internal Medicine Contact information: [place] [address] [phone]                  The results of significant diagnostics from this hospitalization (including imaging, microbiology, ancillary and laboratory) are listed below for reference.    Significant Diagnostic Studies: DG Chest 2 View  Result Date: 04/22/2021 CLINICAL DATA:  Pneumonia follow-up. EXAM: CHEST - 2 VIEW COMPARISON:  Chest x-ray dated April 14, 2021. FINDINGS: Unchanged left chest wall pacemaker. Normal heart size. Normal pulmonary vascularity. Bibasilar scarring and atelectasis. Aeration at the left lung base has slightly improved. No pleural effusion or pneumothorax. No acute osseous abnormality. IMPRESSION: 1. Improved aeration at the left lung base. Electronically Signed   By: Titus Dubin M.D.   On: 04/22/2021 10:51   DG Chest Port 1 View  Result Date: 05/10/2021 CLINICAL DATA:  Evaluate for sepsis EXAM: PORTABLE CHEST 1 VIEW COMPARISON:  None. FINDINGS: LEFT-sided pacemaker overlies stable enlarged cardiac silhouette. There is bibasilar atelectasis and probable small effusions. No pneumothorax. No pulmonary edema. IMPRESSION: Cardiomegaly, bibasilar atelectasis and bilateral pleural effusions. Electronically Signed   By: Suzy Bouchard  M.D.   On: 05/10/2021 13:11    Microbiology: Recent Results (from the past 240 hour(s))  Blood Culture (routine x 2)     Status: None   Collection Time: 05/10/21 12:19 PM   Specimen: BLOOD  Result Value Ref Range Status   Specimen Description   Final    BLOOD BLOOD LEFT ARM Performed at [facility], [address] [place],  [address]    Special Requests   Final    BOTTLES DRAWN AEROBIC AND ANAEROBIC Blood Culture results may not be optimal due to an excessive  volume of blood received in culture bottles Performed at [facility], [address], [place], [place] [address]    Culture   Final    NO GROWTH 5 DAYS Performed at [facility], [address] [place], [place] [address]    Report Status 05/15/2021 FINAL  Final  Blood Culture (routine x 2)     Status: None   Collection Time: 05/10/21 12:20 PM   Specimen: BLOOD  Result Value Ref Range Status   Specimen Description   Final    BLOOD BLOOD RIGHT ARM Performed at [facility], [address], [place], [place] [address]    Special Requests   Final    BOTTLES DRAWN AEROBIC AND ANAEROBIC Blood Culture adequate volume Performed at [facility], [address], [place], [place] [address]    Culture   Final    NO GROWTH 5 DAYS Performed at [facility], [address], [place], [place] [address]    Report Status 05/15/2021 FINAL  Final  Urine Culture     Status: Abnormal   Collection Time: 05/10/21  5:20 PM   Specimen: In/Out Cath Urine  Result Value Ref Range Status   Specimen Description   Final    IN/OUT CATH URINE Performed at [facility], [address] [place], [place] [address]    Special Requests   Final    NONE Performed at [facility], [address] [place], [place] [address]    Culture MULTIPLE SPECIES PRESENT, SUGGEST RECOLLECTION (A)  Final   Report Status  05/12/2021 FINAL  Final  Resp Panel by RT-PCR (Flu A&B, Covid)     Status: None   Collection Time: 05/10/21  5:20 PM   Specimen: Nasopharyngeal(NP) swabs in vial transport medium  Result Value Ref Range Status   SARS Coronavirus 2 by RT PCR NEGATIVE NEGATIVE Final    Comment: (NOTE) SARS-CoV-2 target nucleic acids are NOT DETECTED.  The SARS-CoV-2 RNA is generally detectable in upper respiratory specimens during the acute phase of infection. The lowest concentration of SARS-CoV-2 viral copies this assay can detect is 138 copies/mL. A negative result does not preclude SARS-Cov-2 infection and should not be used as the sole basis for treatment or other patient management decisions. A negative result may occur with  improper specimen collection/handling, submission of specimen other than nasopharyngeal swab, presence of viral mutation(s) within the areas targeted by this assay, and inadequate number of viral copies(<138 copies/mL). A negative result must be combined with clinical observations, patient history, and epidemiological information. The expected result is Negative.  Fact Sheet for Patients:  BloggerCourse.com  Fact Sheet for Healthcare Providers:  SeriousBroker.it  This test is no t yet approved or cleared by the [place] FDA and  has been authorized for detection and/or diagnosis of SARS-CoV-2 by FDA under an Emergency Use Authorization (EUA). This EUA will remain  in effect (meaning this test can be used) for the duration of the COVID-19 declaration under Section 564(b)(1) of the Act, 21 U.S.C.section 360bbb-3(b)(1), unless the authorization is terminated  or revoked sooner.       Influenza A by PCR NEGATIVE NEGATIVE Final   Influenza B by PCR NEGATIVE NEGATIVE Final    Comment: (NOTE) The Xpert Xpress SARS-CoV-2/FLU/RSV plus assay is intended as an aid in the diagnosis of influenza from Nasopharyngeal swab  specimens and should not be used as a sole basis for treatment. Nasal washings and aspirates are unacceptable for Xpert  Xpress SARS-CoV-2/FLU/RSV testing.  Fact Sheet for Patients: EntrepreneurPulse.com.au  Fact Sheet for Healthcare Providers: IncredibleEmployment.be  This test is not yet approved or cleared by the [place] FDA and has been authorized for detection and/or diagnosis of SARS-CoV-2 by FDA under an Emergency Use Authorization (EUA). This EUA will remain in effect (meaning this test can be used) for the duration of the COVID-19 declaration under Section 564(b)(1) of the Act, 21 U.S.C. section 360bbb-3(b)(1), unless the authorization is terminated or revoked.  Performed at [facility], [place], [place] [address]   SARS CORONAVIRUS 2 (TAT 6-24 HRS) Nasopharyngeal Nasopharyngeal Swab     Status: None   Collection Time: 05/15/21 12:06 PM   Specimen: Nasopharyngeal Swab  Result Value Ref Range Status   SARS Coronavirus 2 NEGATIVE NEGATIVE Final    Comment: (NOTE) SARS-CoV-2 target nucleic acids are NOT DETECTED.  The SARS-CoV-2 RNA is generally detectable in upper and lower respiratory specimens during the acute phase of infection. Negative results do not preclude SARS-CoV-2 infection, do not rule out co-infections with other pathogens, and should not be used as the sole basis for treatment or other patient management decisions. Negative results must be combined with clinical observations, patient history, and epidemiological information. The expected result is Negative.  Fact Sheet for Patients: SugarRoll.be  Fact Sheet for Healthcare Providers: https://www.woods-mathews.com/  This test is not yet approved or cleared by the [place] FDA and  has been authorized for detection and/or diagnosis of SARS-CoV-2 by FDA under an Emergency Use  Authorization (EUA). This EUA will remain  in effect (meaning this test can be used) for the duration of the COVID-19 declaration under Se ction 564(b)(1) of the Act, 21 U.S.C. section 360bbb-3(b)(1), unless the authorization is terminated or revoked sooner.  Performed at [facility], [place] [address] [place], [place] [address]      Labs: Basic Metabolic Panel: Recent Labs  Lab 05/10/21 1224 05/11/21 0707 05/12/21 0230 05/16/21 0058  NA 136 137 135 135  K 4.0 3.9 3.8 4.5  CL 101 104 103 93*  CO2 25 23 26  32  GLUCOSE 132* 107* 160* 189*  BUN 13 9 8 19   CREATININE 0.64 0.76 0.71 1.07*  CALCIUM 10.0 8.6* 8.5* 9.9  MG  --  1.9  --   --    Liver Function Tests: Recent Labs  Lab 05/10/21 1224 05/11/21 0707  AST 9* 11*  ALT 6 8  ALKPHOS 46 48  BILITOT 0.8 0.6  PROT 6.6 5.6*  ALBUMIN 4.1 2.9*   No results for input(s): LIPASE, AMYLASE in the last 168 hours. No results for input(s): AMMONIA in the last 168 hours. CBC: Recent Labs  Lab 05/10/21 1224 05/11/21 0707 05/12/21 0230 05/16/21 0058  WBC 13.0* 6.7 7.1 12.3*  NEUTROABS 11.0* 4.8  --   --   HGB 12.2 11.5* 10.9* 10.8*  HCT 39.3 36.8 34.7* 35.2*  MCV 81.9 83.4 81.6 82.2  PLT 238 190 202 268   Cardiac Enzymes: No results for input(s): CKTOTAL, CKMB, CKMBINDEX, TROPONINI in the last 168 hours. BNP: BNP (last 3 results) Recent Labs    11/07/20 0946 02/13/21 1547  BNP 31.0 28.4    ProBNP (last 3 results) No results for input(s): PROBNP in the last 8760 hours.  CBG: No results for input(s): GLUCAP in the last 168 hours.     Signed:  Oswald Hillock MD.  [facility] 05/16/2021, 11:36 AM

## 2021-05-21 ENCOUNTER — Telehealth: Payer: Self-pay | Admitting: Internal Medicine

## 2021-05-21 NOTE — Telephone Encounter (Signed)
Joselyn Glassman, a PT from [facility], has called and states they want an order to continue PT for 1x week for 9 weeks.    Callback #- [id number]  Please lvm, if unavailable.

## 2021-05-21 NOTE — Telephone Encounter (Signed)
Verbal order given  

## 2021-05-23 ENCOUNTER — Encounter: Payer: Self-pay | Admitting: Internal Medicine

## 2021-05-23 ENCOUNTER — Other Ambulatory Visit: Payer: Self-pay

## 2021-05-23 ENCOUNTER — Telehealth: Payer: Self-pay | Admitting: Internal Medicine

## 2021-05-23 ENCOUNTER — Ambulatory Visit (INDEPENDENT_AMBULATORY_CARE_PROVIDER_SITE_OTHER): Payer: Medicare Other | Admitting: Internal Medicine

## 2021-05-23 ENCOUNTER — Telehealth: Payer: Self-pay

## 2021-05-23 ENCOUNTER — Encounter (HOSPITAL_BASED_OUTPATIENT_CLINIC_OR_DEPARTMENT_OTHER): Payer: Self-pay | Admitting: Internal Medicine

## 2021-05-23 VITALS — BP 112/68 | HR 82 | Ht 61.0 in | Wt 208.0 lb

## 2021-05-23 VITALS — BP 128/86 | HR 83 | Temp 98.0°F | Ht 60.0 in | Wt 210.0 lb

## 2021-05-23 DIAGNOSIS — B372 Candidiasis of skin and nail: Secondary | ICD-10-CM | POA: Insufficient documentation

## 2021-05-23 DIAGNOSIS — I5032 Chronic diastolic (congestive) heart failure: Secondary | ICD-10-CM

## 2021-05-23 DIAGNOSIS — E611 Iron deficiency: Secondary | ICD-10-CM

## 2021-05-23 DIAGNOSIS — I10 Essential (primary) hypertension: Secondary | ICD-10-CM | POA: Diagnosis not present

## 2021-05-23 DIAGNOSIS — N1831 Chronic kidney disease, stage 3a: Secondary | ICD-10-CM | POA: Diagnosis not present

## 2021-05-23 DIAGNOSIS — D539 Nutritional anemia, unspecified: Secondary | ICD-10-CM

## 2021-05-23 DIAGNOSIS — D6869 Other thrombophilia: Secondary | ICD-10-CM | POA: Diagnosis not present

## 2021-05-23 DIAGNOSIS — L233 Allergic contact dermatitis due to drugs in contact with skin: Secondary | ICD-10-CM | POA: Diagnosis not present

## 2021-05-23 DIAGNOSIS — I4819 Other persistent atrial fibrillation: Secondary | ICD-10-CM

## 2021-05-23 DIAGNOSIS — N61 Mastitis without abscess: Secondary | ICD-10-CM

## 2021-05-23 LAB — CUP PACEART INCLINIC DEVICE CHECK
Brady Statistic RV Percent Paced: 100 %
Date Time Interrogation Session: 20221216104308
Implantable Lead Implant Date: 20150527
Implantable Lead Location: 753860
Implantable Lead Model: 5076
Implantable Pulse Generator Implant Date: 20150527
Lead Channel Pacing Threshold Amplitude: 0.75 V
Lead Channel Pacing Threshold Amplitude: 0.75 V
Lead Channel Pacing Threshold Amplitude: 1 V
Lead Channel Pacing Threshold Pulse Width: 0.4 ms
Lead Channel Pacing Threshold Pulse Width: 0.4 ms
Lead Channel Pacing Threshold Pulse Width: 0.4 ms
Lead Channel Sensing Intrinsic Amplitude: 1.5 mV
Lead Channel Sensing Intrinsic Amplitude: 5.4 mV

## 2021-05-23 LAB — CBC WITH DIFFERENTIAL/PLATELET
Basophils Absolute: 0.1 10*3/uL (ref 0.0–0.1)
Basophils Relative: 0.6 % (ref 0.0–3.0)
Eosinophils Absolute: 0.1 10*3/uL (ref 0.0–0.7)
Eosinophils Relative: 0.8 % (ref 0.0–5.0)
HCT: 39.7 % (ref 36.0–46.0)
Hemoglobin: 12.7 g/dL (ref 12.0–15.0)
Lymphocytes Relative: 6.5 % — ABNORMAL LOW (ref 12.0–46.0)
Lymphs Abs: 0.9 10*3/uL (ref 0.7–4.0)
MCHC: 32.1 g/dL (ref 30.0–36.0)
MCV: 79.2 fl (ref 78.0–100.0)
Monocytes Absolute: 1 10*3/uL (ref 0.1–1.0)
Monocytes Relative: 7.1 % (ref 3.0–12.0)
Neutro Abs: 11.4 10*3/uL — ABNORMAL HIGH (ref 1.4–7.7)
Neutrophils Relative %: 85 % — ABNORMAL HIGH (ref 43.0–77.0)
Platelets: 235 10*3/uL (ref 150.0–400.0)
RBC: 5.01 Mil/uL (ref 3.87–5.11)
RDW: 16.4 % — ABNORMAL HIGH (ref 11.5–15.5)
WBC: 13.5 10*3/uL — ABNORMAL HIGH (ref 4.0–10.5)

## 2021-05-23 LAB — IBC + FERRITIN
Ferritin: 187.1 ng/mL (ref 10.0–291.0)
Iron: 31 ug/dL — ABNORMAL LOW (ref 42–145)
Saturation Ratios: 8.5 % — ABNORMAL LOW (ref 20.0–50.0)
TIBC: 366.8 ug/dL (ref 250.0–450.0)
Transferrin: 262 mg/dL (ref 212.0–360.0)

## 2021-05-23 LAB — BASIC METABOLIC PANEL
BUN: 18 mg/dL (ref 6–23)
CO2: 29 mEq/L (ref 19–32)
Calcium: 10 mg/dL (ref 8.4–10.5)
Chloride: 96 mEq/L (ref 96–112)
Creatinine, Ser: 0.88 mg/dL (ref 0.40–1.20)
GFR: 64.75 mL/min (ref >60.00)
Glucose, Bld: 178 mg/dL — ABNORMAL HIGH (ref 70–99)
Potassium: 3.8 mEq/L (ref 3.5–5.1)
Sodium: 136 mEq/L (ref 135–145)

## 2021-05-23 LAB — FOLATE: Folate: 10.4 ng/mL (ref >5.9)

## 2021-05-23 MED ORDER — FLUCONAZOLE 50 MG PO TABS: 100.0000 mg | ORAL_TABLET | Freq: Every day | ORAL | 0 refills | Status: DC

## 2021-05-23 MED ORDER — TRAMADOL HCL 50 MG PO TABS: 50.0000 mg | ORAL_TABLET | Freq: Four times a day (QID) | ORAL | 0 refills | Status: AC | PRN

## 2021-05-23 MED ORDER — TRIAMCINOLONE ACETONIDE 0.5 % EX CREA: 1.0000 "application " | TOPICAL_CREAM | Freq: Three times a day (TID) | CUTANEOUS | 1 refills | Status: DC

## 2021-05-23 MED ORDER — AMOXICILLIN-POT CLAVULANATE 875-125 MG PO TABS: 1.0000 | ORAL_TABLET | Freq: Two times a day (BID) | ORAL | 0 refills | Status: AC

## 2021-05-23 MED ORDER — ACCRUFER 30 MG PO CAPS
1.0000 | ORAL_CAPSULE | Freq: Two times a day (BID) | ORAL | 1 refills | Status: DC
Start: 2021-05-23 — End: 2021-08-13

## 2021-05-23 NOTE — Progress Notes (Signed)
Subjective:  Patient ID: Kathleen [name], female    DOB: 10-15-1945  Age: 75 y.o. MRN: [id number]  CC: Rash   This visit occurred during the SARS-CoV-2 public health emergency.  Safety protocols were in place, including screening questions prior to the visit, additional usage of staff PPE, and extensive cleaning of exam room while observing appropriate contact time as indicated for disinfecting solutions.    HPI COOPER SHUE presents for f/up -   She was recently admitted for cellulitis over her left lower breast and chest wall.  It was felt to be related to tinea under her breast.  She took her last dose of Keflex a few days ago and complains that the redness, pain, and swelling in her left breast has returned.  She has been using nystatin cream for the tinea and says it has caused itching and she feels like the tinea has not resolved.  She tells me she has a similar rash in her groin that looks like the rash under her breast.  She has also had low-grade fever and chills.  The area is painful and she requests a refill of tramadol.  Admit date: 05/10/2021 Discharge date: 05/16/2021   Time spent: 60 minutes   Recommendations for Outpatient Follow-up:  Follow-up PCP in 1 week   Discharge Diagnoses:  Principal Problem:   Cellulitis of chest wall Active Problems:   HTN (hypertension)   Atrial fibrillation (HCC)   (HFpEF) heart failure with preserved ejection fraction (HCC)   Sepsis (HCC)   Leukocytosis   Dehydration   Acute prerenal azotemia   Outpatient Medications Prior to Visit  Medication Sig Dispense Refill   acetaminophen (TYLENOL) 650 MG CR tablet Take 1,300 mg by mouth every 8 (eight) hours as needed for pain (take with budesonide).     albuterol (PROVENTIL HFA;VENTOLIN HFA) 108 (90 Base) MCG/ACT inhaler Inhale 2 puffs into the lungs every 4 (four) hours as needed for wheezing or shortness of breath.     arformoterol (BROVANA) 15 MCG/2ML NEBU Take 2 mLs (15 mcg total) by  nebulization 2 (two) times daily. 120 mL 11   azelastine (ASTELIN) 0.1 % nasal spray Place 2 sprays into both nostrils 2 (two) times daily.     budesonide (PULMICORT) 0.25 MG/2ML nebulizer solution Take 2 mLs (0.25 mg total) by nebulization 2 (two) times daily. 60 mL 12   Cholecalciferol (VITAMIN D3) 50 MCG (2000 UT) TABS Take 2,000 Units by mouth at bedtime.     cyanocobalamin (,VITAMIN B-12,) 1000 MCG/ML injection Inject 1 mL (1,000 mcg total) into the muscle every 30 (thirty) days. 10 mL 0   docusate sodium (COLACE) 100 MG capsule Take 100 mg by mouth 2 (two) times daily.     dofetilide (TIKOSYN) 250 MCG capsule Take 250 mcg by mouth in the morning and at bedtime.     ezetimibe (ZETIA) 10 MG tablet Take 10 mg by mouth at bedtime.     febuxostat (ULORIC) 40 MG tablet Take 40 mg by mouth at bedtime.     guaiFENesin (MUCINEX) 600 MG 12 hr tablet Take 1 tablet (600 mg total) by mouth 2 (two) times daily. 20 tablet 0   ipratropium-albuterol (DUONEB) 0.5-2.5 (3) MG/3ML SOLN Inhale 3 mLs into the lungs every 6 (six) hours as needed (wheezing/shortness of breath).     levothyroxine (SYNTHROID) 50 MCG tablet Take 50 mcg by mouth daily before breakfast.  2   linaclotide (LINZESS) 145 MCG CAPS capsule Take 1 capsule (145  mcg total) by mouth daily before breakfast. 90 capsule 1   losartan (COZAAR) 25 MG tablet Take 1 tablet (25 mg total) by mouth at bedtime. 30 tablet 6   Magnesium 500 MG TABS Take 500 mg by mouth at bedtime.     ondansetron (ZOFRAN) 4 MG tablet Take 1 tablet (4 mg total) by mouth every 8 (eight) hours as needed for nausea or vomiting. 12 tablet 0   OXYGEN Inhale 4 L into the lungs continuous. Use with resmed ventilator     predniSONE (DELTASONE) 10 MG tablet Take 1 tablet (10 mg total) by mouth daily. Prednisone 40 mg po daily x 1 day then Prednisone 30 mg po daily x 1 day then Prednisone 20 mg po daily x 1 day then Prednisone 10 mg daily x 1 day then stop... 10 tablet 0   rivaroxaban  (XARELTO) 20 MG TABS tablet Take 20 mg by mouth daily with supper.      spironolactone (ALDACTONE) 25 MG tablet Take 25 mg by mouth every morning.     torsemide (DEMADEX) 20 MG tablet Take 1 tablet (20 mg total) by mouth 2 (two) times a week. Take extra tab for weight 222 lbs or greater 20 tablet 6   YUPELRI 175 MCG/3ML nebulizer solution USE 1 VIAL IN NEBULIZER DAILY - DO NOT MIX WITH OTHER NEB MEDS,USE BEFORE OR AFTER 30 mL 11   benzonatate (TESSALON) 200 MG capsule Take 1 capsule (200 mg total) by mouth 2 (two) times daily as needed for cough. 20 capsule 0   cephALEXin (KEFLEX) 500 MG capsule Take 1 capsule (500 mg total) by mouth every 8 (eight) hours. 6 capsule 0   nystatin (MYCOSTATIN/NYSTOP) powder Apply topically 3 (three) times daily as needed (Rash under breast). 15 g 0   traMADol (ULTRAM) 50 MG tablet Take 50 mg by mouth every 6 (six) hours as needed for moderate pain.     Semaglutide (RYBELSUS) 3 MG TABS Take 3 mg by mouth daily. (Patient not taking: Reported on 05/11/2021) 30 tablet 0   No facility-administered medications prior to visit.    ROS Review of Systems  Constitutional:  Positive for chills and fever. Negative for diaphoresis and fatigue.  HENT: Negative.    Eyes: Negative.   Respiratory:  Positive for shortness of breath. Negative for cough, chest tightness and wheezing.   Cardiovascular:  Negative for chest pain, palpitations and leg swelling.  Gastrointestinal:  Negative for abdominal pain, constipation, diarrhea, nausea and vomiting.  Endocrine: Negative.   Genitourinary: Negative.  Negative for difficulty urinating.  Musculoskeletal:  Negative for arthralgias and myalgias.  Skin:  Positive for color change and rash.  Neurological: Negative.  Negative for dizziness and weakness.  Hematological:  Negative for adenopathy. Does not bruise/bleed easily.  Psychiatric/Behavioral: Negative.     Objective:  BP 128/86 (BP Location: Left Arm, Patient Position: Sitting,  Cuff Size: Normal)    Pulse 83    Temp 98 F (36.7 C) (Oral)    Ht 5' (1.524 m)    Wt 210 lb (95.3 kg)    SpO2 95%    BMI 41.01 kg/m   BP Readings from Last 3 Encounters:  05/23/21 112/68  05/23/21 128/86  05/16/21 (!) 144/79    Wt Readings from Last 3 Encounters:  05/23/21 208 lb (94.3 kg)  05/23/21 210 lb (95.3 kg)  05/16/21 218 lb 0.6 oz (98.9 kg)    Physical Exam Vitals reviewed.  Constitutional:      General: She  is not in acute distress.    Appearance: She is ill-appearing (on O2). She is not toxic-appearing or diaphoretic.  HENT:     Nose: Nose normal.     Mouth/Throat:     Mouth: Mucous membranes are moist.  Eyes:     General: No scleral icterus.    Conjunctiva/sclera: Conjunctivae normal.  Cardiovascular:     Rate and Rhythm: Normal rate and regular rhythm.     Heart sounds: No murmur heard. Pulmonary:     Effort: Pulmonary effort is normal.     Breath sounds: No stridor. No wheezing, rhonchi or rales.  Chest:     Chest wall: No mass, swelling or tenderness.  Breasts:    Right: No swelling, bleeding, inverted nipple, mass, nipple discharge, skin change or tenderness.     Left: Skin change present. No swelling, bleeding, inverted nipple, mass, nipple discharge or tenderness.       Comments: Underneath the left breast there is a large swath of faint erythema with some peeling.  The skin is macerated but there are no specific lesions.  On the left lower quadrant of the breast there is warmth and erythema with some streaking. Abdominal:     General: Abdomen is protuberant. Bowel sounds are normal. There is no distension.     Palpations: Abdomen is soft. There is no hepatomegaly, splenomegaly or mass.     Tenderness: There is no abdominal tenderness.  Musculoskeletal:        General: Normal range of motion.     Cervical back: Neck supple.     Right lower leg: No edema.     Left lower leg: No edema.  Lymphadenopathy:     Cervical: No cervical adenopathy.   Skin:    Findings: Erythema and rash present.  Neurological:     General: No focal deficit present.     Mental Status: She is alert.  Psychiatric:        Mood and Affect: Mood normal.        Behavior: Behavior normal.    Lab Results  Component Value Date   WBC 13.5 (H) 05/23/2021   HGB 12.7 05/23/2021   HCT 39.7 05/23/2021   PLT 235.0 05/23/2021   GLUCOSE 178 (H) 05/23/2021   ALT 8 05/11/2021   AST 11 (L) 05/11/2021   NA 136 05/23/2021   K 3.8 05/23/2021   CL 96 05/23/2021   CREATININE 0.88 05/23/2021   BUN 18 05/23/2021   CO2 29 05/23/2021   TSH 1.502 04/14/2021   INR 1.4 (H) 05/10/2021   HGBA1C 6.5 04/22/2021    DG Chest Port 1 View  Result Date: 05/10/2021 CLINICAL DATA:  Evaluate for sepsis EXAM: PORTABLE CHEST 1 VIEW COMPARISON:  None. FINDINGS: LEFT-sided pacemaker overlies stable enlarged cardiac silhouette. There is bibasilar atelectasis and probable small effusions. No pneumothorax. No pulmonary edema. IMPRESSION: Cardiomegaly, bibasilar atelectasis and bilateral pleural effusions. Electronically Signed   By: Genevive Bi M.D.   On: 05/10/2021 13:11    Assessment & Plan:   Roshawnda was seen today for rash.  Diagnoses and all orders for this visit:  Primary hypertension- Her blood pressure is adequately well controlled. -     CBC with Differential/Platelet; Future -     Basic metabolic panel; Future -     Basic metabolic panel -     CBC with Differential/Platelet  Stage 3a chronic kidney disease (HCC)- Her renal function is stable. -     CBC with Differential/Platelet; Future -  Basic metabolic panel; Future -     Basic metabolic panel -     CBC with Differential/Platelet  Allergic contact dermatitis due to drugs in contact with skin- I am concerned she is allergic to the topical nystatin.  Will discontinue it and treat with triamcinolone cream. -     triamcinolone cream (KENALOG) 0.5 %; Apply 1 application topically 3 (three) times  daily.  Candidiasis of skin- I recommended that she treat this with a systemic option. -     fluconazole (DIFLUCAN) 50 MG tablet; Take 2 tablets (100 mg total) by mouth daily.  Cellulitis of breast- Will treat for strep with Augmentin. -     amoxicillin-clavulanate (AUGMENTIN) 875-125 MG tablet; Take 1 tablet by mouth 2 (two) times daily for 10 days. -     traMADol (ULTRAM) 50 MG tablet; Take 1 tablet (50 mg total) by mouth every 6 (six) hours as needed for up to 7 days for moderate pain.  Deficiency anemia- Will screen for vitamin deficiencies. -     CBC with Differential/Platelet; Future -     IBC + Ferritin; Future -     Folate; Future -     Zinc; Future -     Vitamin B1; Future -     Cancel: Vitamin B12; Future -     Vitamin B1 -     Zinc -     Folate -     IBC + Ferritin -     CBC with Differential/Platelet  Dietary iron deficiency- I recommended that she start an oral iron supplement. -     Ferric Maltol (ACCRUFER) 30 MG CAPS; Take 1 capsule by mouth in the morning and at bedtime.   I have discontinued Viveca Raitz. Woloszyn's Rybelsus, cephALEXin, benzonatate, and nystatin. I have also changed her traMADol. Additionally, I am having her start on triamcinolone cream, fluconazole, amoxicillin-clavulanate, and ACCRUFeR. Lastly, I am having her maintain her levothyroxine, rivaroxaban, albuterol, febuxostat, Magnesium, azelastine, dofetilide, ezetimibe, spironolactone, Vitamin D3, ipratropium-albuterol, docusate sodium, losartan, torsemide, arformoterol, cyanocobalamin, Yupelri, budesonide, ondansetron, linaclotide, OXYGEN, acetaminophen, guaiFENesin, and predniSONE.  Meds ordered this encounter  Medications   triamcinolone cream (KENALOG) 0.5 %    Sig: Apply 1 application topically 3 (three) times daily.    Dispense:  45 g    Refill:  1   fluconazole (DIFLUCAN) 50 MG tablet    Sig: Take 2 tablets (100 mg total) by mouth daily.    Dispense:  14 tablet    Refill:  0    amoxicillin-clavulanate (AUGMENTIN) 875-125 MG tablet    Sig: Take 1 tablet by mouth 2 (two) times daily for 10 days.    Dispense:  20 tablet    Refill:  0   traMADol (ULTRAM) 50 MG tablet    Sig: Take 1 tablet (50 mg total) by mouth every 6 (six) hours as needed for up to 7 days for moderate pain.    Dispense:  30 tablet    Refill:  0   Ferric Maltol (ACCRUFER) 30 MG CAPS    Sig: Take 1 capsule by mouth in the morning and at bedtime.    Dispense:  180 capsule    Refill:  1      Follow-up: Return in about 1 week (around 05/30/2021).  Scarlette Calico, MD

## 2021-05-23 NOTE — Progress Notes (Signed)
Kathleen Lima, MD: Primary Cardiologist:  Dr Haroldine Laws EP: Previously Dr Dwana Curd ([facility])  Kathleen [name] is a 75 y.o. female with a h/o obesity, BOOP, COPD, permanent afib, and CHB s/p AV nodal ablation and PPM (MDT) by Dr Westley Gambles who presents today to establish care in the Electrophysiology device clinic.  I saw her in 2017 to establish.  I have not seen her since that time.  Per my last note:  She has had a challenging CV history.  She says that she was diagnosed with atrial fibrillation in 2014.  She took multaq but developed a rash.  She was placed on tikosyn but continued to have afib.  She appears to have had PVI + CTI by Dr Westley Gambles 2/15.  She states that this took 9 hours and was not successful.  She continued to have atrial arrhythmias.  She underwent AV nodal ablation and pacemaker implantation 11/02/15.  LV lead could not be placed however she received a CRT-P device.  Per Dr Zettie Cooley recent note, it appears that the lead was not placed due to high LV lead thresholds.  She had return of her AV nodal conduction and required repeat AV nodal ablation by Dr Dwana Curd the following day.  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  She returned to EP lab and had the device placed submuscular Sept 2015. She has done reasonably well since that time.  She has occasional fatigue.  She struggles with chronic lung disease.  She has required high dose steroids previously.  She is on CPAP.  Per Dr Samuella Bruin note (8/22), she had redo ablation 2019.  She was started on tikosyn 3/22 with improvement in her AF burden.  The patient also had left bundle lead placed 05/09/2020.  She is doing reasonably well.  AF is controlled.  Breathing is "better".  She is not very active.  She is primarily limited by DJD.  She wears O2.  She was recently hospitalized with L breast skin infection (notes reviewed).  She did not have any involvement of her  pacemaker pocket.   She did not have positive blood cultures.   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    COPD (chronic obstructive pulmonary disease) (HCC)    Gout    Hypertension    Longstanding persistent atrial fibrillation (HCC)    Nonischemic cardiomyopathy ([place])    Obesity    Pacemaker    Spontaneous pneumothorax 2013   Past Surgical History:  Procedure Laterality Date   ABDOMINAL HYSTERECTOMY     ATRIAL FIBRILLATION ABLATION  07/20/2013   by Dr Westley Gambles   AV nodal ablation  11/01/2013   by Dr Westley Gambles, repeated by Dr Dwana Curd   BREAST BIOPSY Bilateral 1997   negative   CARDIAC CATHETERIZATION     CHOLECYSTECTOMY     HERNIA REPAIR     PACEMAKER INSERTION  06/2017   MDT Viva CRT-P implanted by Dr Westley Gambles after AV nodal ablation,  LV lead could not be placed    Social History   Socioeconomic History   Marital status: Widowed    Spouse name: Not on file   Number of children: Not on file  Years of education: Not on file   Highest education level: Not on file  Occupational History   Occupation: retired  Tobacco Use   Smoking status: Former    Types: Cigarettes    Quit date: 06/09/1987    Years since quitting: 33.9   Smokeless tobacco: Never  Vaping Use   Vaping Use: Never used  Substance and Sexual Activity   Alcohol use: No    Alcohol/week: 0.0 standard drinks   Drug use: No   Sexual activity: Not Currently  Other Topics Concern   Not on file  Social History Narrative   Pt lives in [place] alone.  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  Her son works in [employer].   Social Determinants of Health   Financial Resource Strain: Not on file  Food Insecurity: Not on file  Transportation Needs: Not on file  Physical  Activity: Not on file  Stress: Not on file  Social Connections: Not on file  Intimate Partner Violence: Not on file    Family History  Problem Relation Age of Onset   Breast cancer Mother 68       3 different times   Breast cancer Cousin     Allergies  Allergen Reactions   Allopurinol Other (See Comments)    Reaction:  Dizziness    Clindamycin Anaphylaxis and Hives   Pneumococcal 13-Val Conj Vacc Itching, Swelling and Rash   Dronedarone Rash   Entresto [Sacubitril-Valsartan] Other (See Comments)    hypotension   Fosamax [Alendronate Sodium] Nausea Only   Meperidine Nausea And Vomiting   Rosuvastatin Other (See Comments)    Reaction:  Muscle spasms    Tetracycline Hives   Adhesive [Tape] Rash   Lovastatin Rash    Other reaction(s): Muscle Pain    Current Outpatient Medications  Medication Sig Dispense Refill   acetaminophen (TYLENOL) 650 MG CR tablet Take 1,300 mg by mouth every 8 (eight) hours as needed for pain (take with budesonide).     albuterol (PROVENTIL HFA;VENTOLIN HFA) 108 (90 Base) MCG/ACT inhaler Inhale 2 puffs into the lungs every 4 (four) hours as needed for wheezing or shortness of breath.     amoxicillin-clavulanate (AUGMENTIN) 875-125 MG tablet Take 1 tablet by mouth 2 (two) times daily for 10 days. 20 tablet 0   arformoterol (BROVANA) 15 MCG/2ML NEBU Take 2 mLs (15 mcg total) by nebulization 2 (two) times daily. 120 mL 11   azelastine (ASTELIN) 0.1 % nasal spray Place 2 sprays into both nostrils 2 (two) times daily.     budesonide (PULMICORT) 0.25 MG/2ML nebulizer solution Take 2 mLs (0.25 mg total) by nebulization 2 (two) times daily. 60 mL 12   Cholecalciferol (VITAMIN D3) 50 MCG (2000 UT) TABS Take 2,000 Units by mouth at bedtime.     cyanocobalamin (,VITAMIN B-12,) 1000 MCG/ML injection Inject 1 mL (1,000 mcg total) into the muscle every 30 (thirty) days. 10 mL 0   docusate sodium (COLACE) 100 MG capsule Take 100 mg by mouth 2 (two) times daily.      dofetilide (TIKOSYN) 250 MCG capsule Take 250 mcg by mouth in the morning and at bedtime.     ezetimibe (ZETIA) 10 MG tablet Take 10 mg by mouth at bedtime.     febuxostat (ULORIC) 40 MG tablet Take 40 mg by mouth at bedtime.     fluconazole (DIFLUCAN) 50 MG tablet Take 2 tablets (100 mg total) by mouth daily. 14 tablet 0   guaiFENesin (  MUCINEX) 600 MG 12 hr tablet Take 1 tablet (600 mg total) by mouth 2 (two) times daily. 20 tablet 0   ipratropium-albuterol (DUONEB) 0.5-2.5 (3) MG/3ML SOLN Inhale 3 mLs into the lungs every 6 (six) hours as needed (wheezing/shortness of breath).     levothyroxine (SYNTHROID) 50 MCG tablet Take 50 mcg by mouth daily before breakfast.  2   linaclotide (LINZESS) 145 MCG CAPS capsule Take 1 capsule (145 mcg total) by mouth daily before breakfast. 90 capsule 1   losartan (COZAAR) 25 MG tablet Take 1 tablet (25 mg total) by mouth at bedtime. 30 tablet 6   Magnesium 500 MG TABS Take 500 mg by mouth at bedtime.     ondansetron (ZOFRAN) 4 MG tablet Take 1 tablet (4 mg total) by mouth every 8 (eight) hours as needed for nausea or vomiting. 12 tablet 0   OXYGEN Inhale 4 L into the lungs continuous. Use with resmed ventilator     predniSONE (DELTASONE) 10 MG tablet Take 1 tablet (10 mg total) by mouth daily. Prednisone 40 mg po daily x 1 day then Prednisone 30 mg po daily x 1 day then Prednisone 20 mg po daily x 1 day then Prednisone 10 mg daily x 1 day then stop... 10 tablet 0   rivaroxaban (XARELTO) 20 MG TABS tablet Take 20 mg by mouth daily with supper.      spironolactone (ALDACTONE) 25 MG tablet Take 25 mg by mouth every morning.     torsemide (DEMADEX) 20 MG tablet Take 1 tablet (20 mg total) by mouth 2 (two) times a week. Take extra tab for weight 222 lbs or greater 20 tablet 6   traMADol (ULTRAM) 50 MG tablet Take 1 tablet (50 mg total) by mouth every 6 (six) hours as needed for up to 7 days for moderate pain. 30 tablet 0   triamcinolone cream (KENALOG) 0.5 % Apply 1  application topically 3 (three) times daily. 45 g 1   YUPELRI 175 MCG/3ML nebulizer solution USE 1 VIAL IN NEBULIZER DAILY - DO NOT MIX WITH OTHER NEB MEDS,USE BEFORE OR AFTER 30 mL 11   No current facility-administered medications for this visit.    ROS- all systems are reviewed and negative except as per HPI  Physical Exam: Vitals:   05/23/21 1033  BP: 112/68  Pulse: 82  SpO2: 92%  Weight: 208 lb (94.3 kg)  Height: 5\' 1"  (1.549 m)    GEN- The patient is well appearing, alert and oriented x 3 today.   Head- normocephalic, atraumatic Eyes-  Sclera clear, conjunctiva pink Ears- hearing intact Oropharynx- clear Neck- supple,  Lungs- Clear to ausculation bilaterally, normal work of breathing Chest- pacemaker pocket is well healed Heart- Regular rate and rhythm,  GI- soft,  Extremities- no clubbing, cyanosis, or edema MS- no significant deformity or atrophy Skin- no rash or lesion Psych- euthymic mood, full affect Neuro- strength and sensation are intact  Pacemaker interrogation- reviewed in detail today,  See PACEART report  Ekg reveals sinus with V pacing  Assessment and Plan:  Complete heart block S/p prior AV nodal ablation at [facility] Normal pacemaker function She has a 3830 left bundle lead in the LV port.  This is confirmed by review of CXR 05/10/21. See Claudia Desanctis Art report No changes today We could consider left  bundle only pacing in the future.  I am not convinced that RV pacing is beneficial. she is not device dependant today  2. Chronic diastolic dysfunction Follows with Dr  Bensimhon Echo 2022 shows preserved EF. Euvolemic today  3. Persistent afib well controlled with tikosyn Recent labs are reviewed Will follow-up in the AF clinic every 3-6 months On xarelto for stroke prevention  4. Obesity Body mass index is 39.3 kg/m. Lifestyle modification advised  5. HTN Stable No change required today  We will follow remotely going forward Return to see AF  clinic every 3-6 months while on tikosyn  Return to see EP MD in a year

## 2021-05-23 NOTE — Telephone Encounter (Signed)
Patient was marked as a no show for an appointment 05/14/2021 @ 1:00 with Dr Lawerance Bach patient could not be appointment due to being in the hospital 05-11-11-09.

## 2021-05-23 NOTE — Patient Instructions (Signed)
Medication Instructions:  Your physician recommends that you continue on your current medications as directed. Please refer to the Current Medication list given to you today. *If you need a refill on your cardiac medications before your next appointment, please call your pharmacy*  Lab Work: None. If you have labs (blood work) drawn today and your tests are completely normal, you will receive your results only by: MyChart Message (if you have MyChart) OR A paper copy in the mail If you have any lab test that is abnormal or we need to change your treatment, we will call you to review the results.  Testing/Procedures: None.  Follow-Up: At [facility], you and your health needs are our priority.  As part of our continuing mission to provide you with exceptional heart care, we have created designated Provider Care Teams.  These Care Teams include your primary Cardiologist (physician) and Advanced Practice Providers (APPs -  Physician Assistants and Nurse Practitioners) who all work together to provide you with the care you need, when you need it.  Your physician wants you to follow-up in: 3 months in the [facility] Clinic they will contact you to schedule.   Remote monitoring is used to monitor your Pacemaker from home. This monitoring reduces the number of office visits required to check your device to one time per year. It allows [place] to keep an eye on the functioning of your device to ensure it is working properly. You are scheduled for a device check from home on 08/22/21. You may send your transmission at any time that day. If you have a wireless device, the transmission will be sent automatically. After your physician reviews your transmission, you will receive a postcard with your next transmission date.  We recommend signing up for the patient portal called "MyChart".  Sign up information is provided on this After Visit Summary.  MyChart is used to connect with patients for Virtual Visits  (Telemedicine).  Patients are able to view lab/test results, encounter notes, upcoming appointments, etc.  Non-urgent messages can be sent to your provider as well.   To learn more about what you can do with MyChart, go to [email or web address].    Any Other Special Instructions Will Be Listed Below (If Applicable).

## 2021-05-23 NOTE — Telephone Encounter (Signed)
Patient was seen in Dr. Johney Frame clinic today and needs transferring back into clinic for remote monitoring. Patient is currently followed by [facility] [phone]. Attempted to call to request release. No answer, LMTCB. Request for transfer in has been completed by myself in [facility].

## 2021-05-23 NOTE — Patient Instructions (Signed)

## 2021-05-26 ENCOUNTER — Encounter: Payer: Self-pay | Admitting: Internal Medicine

## 2021-05-26 ENCOUNTER — Other Ambulatory Visit: Payer: Self-pay | Admitting: Internal Medicine

## 2021-05-26 DIAGNOSIS — M10379 Gout due to renal impairment, unspecified ankle and foot: Secondary | ICD-10-CM | POA: Insufficient documentation

## 2021-05-26 MED ORDER — METHYLPREDNISOLONE 4 MG PO TBPK: ORAL_TABLET | ORAL | 0 refills | Status: AC

## 2021-05-27 ENCOUNTER — Encounter (HOSPITAL_COMMUNITY): Payer: Self-pay | Admitting: Internal Medicine

## 2021-05-29 ENCOUNTER — Telehealth: Payer: Self-pay | Admitting: Gastroenterology

## 2021-05-29 NOTE — Telephone Encounter (Signed)
I spoke with Dr. Cloyde Reams of [facility], a former colleague of mine.  He has discussed the patient's history in regards to a previous virtual colonoscopy that showed 2 polyps in the transverse colon.  She was supposed to have a colonoscopy at [facility] but due to comorbidities from a heart and lung perspective this was held for a time.  I have reviewed her most recent labs and she does have some iron deficiency but no overt anemia.  As a result of her living in [place] now I am happy to be of assistance in regards to her endoscopic evaluation. If she would like to meet me, I am happy to meet her in clinic. I am also happy to go ahead and have her scheduled with me as my next available colonoscopy plus EGD due to history of IDA. She will need 2-day hold of Xarelto so we will need to be in touch with patient's cardiologist. Please offer her my next available EGD/colonoscopy slot in the hospital based setting. Please update me when this has been scheduled so I can update her referring provider. Thanks. GM

## 2021-05-29 NOTE — Telephone Encounter (Signed)
Pt called in stating that her PCP recently informed her that she may have colon cancer. She said her PCP "sent her a lab result stating she could have infection, internal bleeding, and colon cancer." Pt currently see's Dr. Lucretia Roers with [facility]. She recently moved to [place], and said that he mentioned a colonoscopy could be done with Dr. Meridee Score potentially in a hospital setting d/t her history of CHF/COPD. Pt was informed that a referral would need to be made. Fax number for this office was provided. Pt stated that she would be in touch with [facility], and let us know with any updates. No further questions at this time.

## 2021-05-29 NOTE — Telephone Encounter (Signed)
Inbound call from patient. States that her PCP believes that she may have colon cancer and wants her to have a colonoscopy. Patient currently goes to [facility] and sees Dr. Pixie Casino.  Patient stated that Dr. Lucretia Roers thinks that she should have her procedure done in a hospital atmosphere and recommends her to come to Dr. Meridee Score.  Please advise.

## 2021-05-30 ENCOUNTER — Telehealth: Payer: Self-pay

## 2021-05-30 LAB — VITAMIN B1: Vitamin B1 (Thiamine): 11 nmol/L (ref 8–30)

## 2021-05-30 LAB — ZINC: Zinc: 72 ug/dL (ref 60–130)

## 2021-05-30 NOTE — Telephone Encounter (Signed)
Spoke with pt regarding new visit with GM. It is an OV scheduled for 07/04/21 at 2:10 pm.

## 2021-05-30 NOTE — Telephone Encounter (Signed)
The is in Carelink. I spoke with the patient and she gave me her most recent pacemaker model/serial number. She also gave me her His bundle lead model and serial number.

## 2021-06-03 NOTE — Telephone Encounter (Signed)
Pt will set up procedure at upcoming office visit on 07/04/21.

## 2021-06-03 NOTE — Addendum Note (Signed)
Encounter addended by: Crissie Figures, RN on: 06/03/2021 10:33 AM  Actions taken: Imaging Exam ended

## 2021-06-05 ENCOUNTER — Telehealth: Payer: Self-pay | Admitting: Pulmonary Disease

## 2021-06-05 NOTE — Telephone Encounter (Signed)
Called and spoke with pt stating to her that the instructions for her budesonide neb sol specifies for her to take this twice a day. Pt said when she last spoke with [facility], she said that [facility] told her that they had instructions stating for her to take it once a day and I stated to her again that she is supposed to be taking the med twice a day.  Stated to pt that we would call [facility] tomorrow 12/30 to get this clarified and she verbalized understanding. Pt said that she has 4 days left of the 0.25mg  sol.

## 2021-06-06 ENCOUNTER — Telehealth: Payer: Self-pay | Admitting: Internal Medicine

## 2021-06-06 NOTE — Telephone Encounter (Signed)
Ree Kida PT w/ [facility] calling in  Wanted to make [place] aware patient refused visit today stating that she had the flu

## 2021-06-10 ENCOUNTER — Other Ambulatory Visit (HOSPITAL_COMMUNITY): Payer: Self-pay | Admitting: *Deleted

## 2021-06-10 ENCOUNTER — Encounter (HOSPITAL_COMMUNITY): Payer: Self-pay | Admitting: Internal Medicine

## 2021-06-10 MED ORDER — SPIRONOLACTONE 25 MG PO TABS: 25.0000 mg | ORAL_TABLET | Freq: Every morning | ORAL | 3 refills | Status: DC

## 2021-06-10 NOTE — Telephone Encounter (Signed)
Noted  

## 2021-06-11 ENCOUNTER — Telehealth: Payer: Self-pay | Admitting: Internal Medicine

## 2021-06-11 MED ORDER — BUDESONIDE 0.25 MG/2ML IN SUSP: 0.2500 mg | Freq: Two times a day (BID) | RESPIRATORY_TRACT | 12 refills | Status: DC

## 2021-06-11 NOTE — Telephone Encounter (Signed)
Called [facility] pharmacy and spoke with Thayer Ohm about pt's budesonide Rx. Per Thayer Ohm, the quantity that was shown to be dispensed is for a 15-day supply not 30-day supply as it should be instead of if pt is to do the Rx twice a day.  Stated to [place] that I would get the quantity changed and he verbalized understanding. New Rx has been sent.   Called and spoke with pt letting her know that this had been done and she verbalized understanding. Nothing further needed.

## 2021-06-11 NOTE — Telephone Encounter (Signed)
Patient calling in  Patient says she finally received the med Ferric Maltol (ACCRUFER) 30 MG CAPS in the mail & says she is also taking levothyroxine (SYNTHROID) 50 MCG tablet & she read that the two meds should not be taken together  Wants provider to confirm if its okay to take both  Please call [phone]

## 2021-06-11 NOTE — Telephone Encounter (Signed)
Called pt, LVM with instruction per PCP. MyChart sent also.

## 2021-06-13 ENCOUNTER — Encounter: Payer: Self-pay | Admitting: Internal Medicine

## 2021-06-23 ENCOUNTER — Telehealth: Payer: Self-pay | Admitting: Pulmonary Disease

## 2021-06-23 NOTE — Telephone Encounter (Signed)
Called and spoke with patient. She has been having a hard time with getting her budesonide from [facility]. Each month they will tell her something is wrong with the prescription. I advised her that I would call [facility] to see why they are having a problem filling the prescription. She verbalized understanding.   I called [facility] and spoke with Harriett Sine. She was able to find the patient in their system. She stated that for some reason the RX had defaulted back to once daily. After I told her about the next RX that was sent on 01/04, she was able to update the patient's chart for the next shipment. Per Harriett Sine, the patient should not have any more issues with getting her medication moving forward.   Called and spoke with patient again. She verbalized understanding. She is aware to call us if she experiences anymore issues.   Nothing further needed at time of call.

## 2021-06-24 ENCOUNTER — Other Ambulatory Visit: Payer: Self-pay

## 2021-06-24 ENCOUNTER — Ambulatory Visit (HOSPITAL_COMMUNITY)
Admission: RE | Admit: 2021-06-24 | Discharge: 2021-06-24 | Disposition: A | Discharge status: Home / Self Care | Payer: Medicare Other | Source: Ambulatory Visit | Attending: Internal Medicine | Admitting: Internal Medicine

## 2021-06-24 ENCOUNTER — Encounter (HOSPITAL_COMMUNITY): Payer: Self-pay | Admitting: Internal Medicine

## 2021-06-24 VITALS — BP 144/76 | HR 80 | Wt 206.8 lb

## 2021-06-24 DIAGNOSIS — I5032 Chronic diastolic (congestive) heart failure: Secondary | ICD-10-CM | POA: Diagnosis present

## 2021-06-24 DIAGNOSIS — Z87891 Personal history of nicotine dependence: Secondary | ICD-10-CM | POA: Insufficient documentation

## 2021-06-24 DIAGNOSIS — Z6839 Body mass index (BMI) 39.0-39.9, adult: Secondary | ICD-10-CM | POA: Diagnosis not present

## 2021-06-24 DIAGNOSIS — Z9981 Dependence on supplemental oxygen: Secondary | ICD-10-CM | POA: Diagnosis not present

## 2021-06-24 DIAGNOSIS — G4733 Obstructive sleep apnea (adult) (pediatric): Secondary | ICD-10-CM | POA: Diagnosis not present

## 2021-06-24 DIAGNOSIS — I130 Hypertensive heart and chronic kidney disease with heart failure and stage 1 through stage 4 chronic kidney disease, or unspecified chronic kidney disease: Secondary | ICD-10-CM | POA: Insufficient documentation

## 2021-06-24 DIAGNOSIS — Z7901 Long term (current) use of anticoagulants: Secondary | ICD-10-CM | POA: Insufficient documentation

## 2021-06-24 DIAGNOSIS — Z79899 Other long term (current) drug therapy: Secondary | ICD-10-CM | POA: Insufficient documentation

## 2021-06-24 DIAGNOSIS — I4819 Other persistent atrial fibrillation: Secondary | ICD-10-CM | POA: Diagnosis not present

## 2021-06-24 DIAGNOSIS — J9611 Chronic respiratory failure with hypoxia: Secondary | ICD-10-CM | POA: Diagnosis not present

## 2021-06-24 MED ORDER — TORSEMIDE 20 MG PO TABS: ORAL_TABLET | ORAL | 3 refills | Status: DC

## 2021-06-24 NOTE — Progress Notes (Addendum)
ADVANCED HF CLINIC NOTE  Referring Physician: Dr. Weyman Croon  Primary Care: [name], MD Primary Cardiologist: Dr. Weyman Croon ([facility])  HPI:  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 Recently moved from [place] to [place] and referred by Dr. Weyman Croon to establish cardiology care.   Previous echo with EF 40% but EF recovered. Echo ([facility]) 8/22 EF > 55% RV normal   She has been followed by Dr. Dwana Curd and Weyman Croon at [facility]. Has maintained NSR on Tikosyn.   RHC + LHC (01/17/20): RA 12, PA mean 29, PCWP 13, CI 2.2, Coronary angiogram without any stenosis  RHC (02/08/20): RA 8, PAM 24, wedge 16, CI 2.5  Now followed by Dr. Erin Fulling in Pulmonary for her BOOP.    I saw her for the fist time in 9/22. She is here with her son (who works in Engineer, technical sales at [employer]). She has also seen Dr. Weyman Croon at [facility]. Was on Entresto for awhile and it was stopped due to low BP. Failed Jardiance with frequent UTIs. Lives at [facility]. Had Norovirus in December. Now feeling better. Doing Cardiomens 1-2x/week. Last reading on 06/20/20 was 16 (goal 17).  Gets around with [place]. SOB with mild activity. Only taking spiro 25. No taking torsemide regularly. Last week took 2 torsemide for 10 pound weight gain. Drinking a lot of water. Not taking BP regularly. Occasionally dizzy. Compliant with Xarelto. No bleeding.   Zio  11/22   1. Predominantly AF with v-pacing with periods of sinus rhythm with V-pacing - avg HR of 75 bpm. 2. 2. Rare PACs and PVCs.   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     albuterol (PROVENTIL HFA;VENTOLIN HFA) 108 (90 Base) MCG/ACT inhaler Inhale 2 puffs into the lungs every 4 (four) hours as needed for wheezing or shortness of breath.     arformoterol (BROVANA) 15 MCG/2ML NEBU Take 2 mLs (15 mcg total) by nebulization 2 (two) times daily. 120 mL 11   azelastine (ASTELIN) 0.1 % nasal spray Place 2 sprays into both nostrils 2 (two) times daily.     budesonide (PULMICORT) 0.25 MG/2ML nebulizer solution Take 2 mLs (0.25 mg total) by nebulization 2 (two) times daily. 120 mL 12   Cholecalciferol (VITAMIN D3) 50 MCG (2000 UT) TABS Take 2,000 Units by mouth at bedtime.     cyanocobalamin (,VITAMIN B-12,) 1000 MCG/ML injection Inject 1 mL (1,000 mcg total) into the muscle every 30 (thirty) days. 10 mL 0   docusate sodium (COLACE) 100 MG capsule Take 100 mg by mouth 2 (  two) times daily.     dofetilide (TIKOSYN) 250 MCG capsule Take 250 mcg by mouth in the morning and at bedtime.     ezetimibe (ZETIA) 10 MG tablet Take 10 mg by mouth at bedtime.     febuxostat (ULORIC) 40 MG tablet Take 40 mg by mouth at bedtime.     Ferric Maltol (ACCRUFER) 30 MG CAPS Take 1 capsule by mouth in the morning and at bedtime. 180 capsule 1   guaiFENesin (MUCINEX) 600 MG 12 hr tablet Take 600 mg by mouth as needed.     ipratropium-albuterol (DUONEB) 0.5-2.5 (3) MG/3ML SOLN Inhale 3 mLs into the lungs every 6 (six) hours as needed (wheezing/shortness of breath).     levothyroxine (SYNTHROID) 50 MCG tablet Take 50 mcg by mouth daily before breakfast.  2    linaCLOtide (LINZESS PO) Take 145 mg by mouth as needed.     losartan (COZAAR) 25 MG tablet Take 1 tablet (25 mg total) by mouth at bedtime. 30 tablet 6   Magnesium 500 MG TABS Take 500 mg by mouth at bedtime.     OXYGEN Inhale 4 L into the lungs continuous. Use with resmed ventilator     rivaroxaban (XARELTO) 20 MG TABS tablet Take 20 mg by mouth daily with supper.      spironolactone (ALDACTONE) 25 MG tablet Take 1 tablet (25 mg total) by mouth every morning. 30 tablet 3   torsemide (DEMADEX) 20 MG tablet Take 20 mg by mouth as needed.     YUPELRI 175 MCG/3ML nebulizer solution USE 1 VIAL IN NEBULIZER DAILY - DO NOT MIX WITH OTHER NEB MEDS,USE BEFORE OR AFTER 30 mL 11   No current facility-administered medications for this encounter.    Allergies  Allergen Reactions   Allopurinol Other (See Comments)    Reaction:  Dizziness    Clindamycin Anaphylaxis and Hives   Pneumococcal 13-Val Conj Vacc Itching, Swelling and Rash   Dronedarone Rash   Entresto [Sacubitril-Valsartan] Other (See Comments)    hypotension   Fosamax [Alendronate Sodium] Nausea Only   Meperidine Nausea And Vomiting   Rosuvastatin Other (See Comments)    Reaction:  Muscle spasms    Tetracycline Hives   Adhesive [Tape] Rash   Lovastatin Rash    Other reaction(s): Muscle Pain      Social History   Socioeconomic History   Marital status: Widowed    Spouse name: Not on file   Number of children: Not on file   Years of education: Not on file   Highest education level: Not on file  Occupational History   Occupation: retired  Tobacco Use   Smoking status: Former    Types: Cigarettes    Quit date: 06/09/1987    Years since quitting: 34.0   Smokeless tobacco: Never  Vaping Use   Vaping Use: Never used  Substance and Sexual Activity   Alcohol use: No    Alcohol/week: 0.0 standard drinks   Drug use: No   Sexual activity: Not Currently  Other Topics Concern   Not on file  Social History Narrative   Pt  lives in [place] alone.  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  Her son works in [employer].   Social Determinants of Health   Financial Resource Strain: Not on file  Food Insecurity: Not on file  Transportation Needs: Not on file  Physical Activity: Not on file  Stress: Not on file  Social Connections: Not on file  Intimate Partner Violence: Not on file      Family History  Problem Relation Age of Onset   Breast cancer Mother 64       3 different times   Breast cancer Cousin     Vitals:   06/24/21 1136  BP: (!) 144/76  Pulse: 80  SpO2: 97%  Weight: 93.8 kg (206 lb 12.8 oz)     PHYSICAL EXAM: General:  Obese woman wearing O2 by [place]. No resp difficulty + chronic cough  HEENT: normal Neck: supple. no JVD. Carotids 2+ bilat; no bruits. No lymphadenopathy or thryomegaly appreciated. Cor: PMI nondisplaced. Regular rate & rhythm. No rubs, gallops or murmurs. Lungs: clear Abdomen: obese soft, nontender, nondistended. No hepatosplenomegaly. No bruits or masses. Good bowel sounds. Extremities: no cyanosis, clubbing, rash, edema Neuro: alert & orientedx3, cranial nerves grossly intact. moves all 4 extremities w/o difficulty. Affect pleasant  ECG. NSR with v-pacing 78 Personally reviewed   ASSESSMENT & PLAN:   1. Chronic diastolic HF - Echo 123456 EF 55%. RV ok  - Chronic NYHA III  - Volume status ok on exam and Cardiomems. Only taking torsemide 20 mg as needed. Would go back to taking 2x/week + PRN - Failed Entresto due to low BP.Continue losartan - Continue Spiro 25 daily - Failed Jardiance due to frequent UTIs - Encouraged daily compliance Cardiomems - Recent labs were ok   2. 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 Erin Fulling)  3. Persistent AF - s/p AVN ablation and PM (failed upgrade to BiV) - maintaining NSR on Tikosyn (Zio showed significant AF burden) but in NSR today and Pacer interrogation today < 0.1%  AF No VT Personally reviewed - QTC 423ms today.  - Continue Xarelto  4. Morbid obesity  - Body mass index is 39.07 kg/m.  5. OSA - on Bipap - Continue BIPAP   Glori Bickers, MD  12:10 PM

## 2021-06-24 NOTE — Addendum Note (Signed)
Encounter addended by: Linda Hedges, RN on: 06/24/2021 12:24 PM  Actions taken: Order list changed, Clinical Note Signed

## 2021-06-24 NOTE — Patient Instructions (Signed)
Medication Changes:  Take Torsemide 20mg  Monday & Friday  Lab Work:  none  Testing/Procedures:  none  Referrals:  none  Special Instructions // Education:  Use Cardiomems Daily and Limit Water in Take   Follow-Up in: 4 months  At the [facility], you and your health needs are our priority. We have a designated team specialized in the treatment of Heart Failure. This Care Team includes your primary Heart Failure Specialized Cardiologist (physician), Advanced Practice Providers (APPs- Physician Assistants and Nurse Practitioners), and Pharmacist who all work together to provide you with the care you need, when you need it.   You may see any of the following providers on your designated Care Team at your next follow up:  Dr Saturday Dr Arvilla Meres, NP Carron Curie, Robbie Lis [facility], [place], PharmD   Please be sure to bring in all your medications bottles to every appointment.   Need to Contact Karle Plumber:  If you have any questions or concerns before your next appointment please send [place] a message through [place] or call our office at [phone].    TO LEAVE A MESSAGE FOR THE NURSE SELECT OPTION 2, PLEASE LEAVE A MESSAGE INCLUDING: YOUR NAME DATE OF BIRTH CALL BACK NUMBER REASON FOR CALL**this is important as we prioritize the call backs  YOU WILL RECEIVE A CALL BACK THE SAME DAY AS LONG AS YOU CALL BEFORE 4:00 PM

## 2021-06-24 NOTE — Addendum Note (Signed)
Encounter addended by: Dolores Patty, MD on: 06/24/2021 12:18 PM  Actions taken: Clinical Note Signed, Charge Capture section accepted

## 2021-06-25 ENCOUNTER — Encounter (HOSPITAL_COMMUNITY): Payer: Self-pay | Admitting: Internal Medicine

## 2021-06-30 ENCOUNTER — Encounter (HOSPITAL_COMMUNITY): Payer: Self-pay | Admitting: Internal Medicine

## 2021-07-04 ENCOUNTER — Ambulatory Visit (INDEPENDENT_AMBULATORY_CARE_PROVIDER_SITE_OTHER): Payer: Medicare Other | Admitting: Gastroenterology

## 2021-07-04 ENCOUNTER — Encounter: Payer: Self-pay | Admitting: Gastroenterology

## 2021-07-04 VITALS — BP 126/78 | HR 80 | Ht 60.0 in | Wt 207.2 lb

## 2021-07-04 DIAGNOSIS — Z86010 Personal history of colonic polyps: Secondary | ICD-10-CM | POA: Diagnosis not present

## 2021-07-04 DIAGNOSIS — R1310 Dysphagia, unspecified: Secondary | ICD-10-CM

## 2021-07-04 DIAGNOSIS — Z7901 Long term (current) use of anticoagulants: Secondary | ICD-10-CM

## 2021-07-04 DIAGNOSIS — E611 Iron deficiency: Secondary | ICD-10-CM | POA: Diagnosis not present

## 2021-07-04 DIAGNOSIS — R933 Abnormal findings on diagnostic imaging of other parts of digestive tract: Secondary | ICD-10-CM

## 2021-07-04 DIAGNOSIS — J9611 Chronic respiratory failure with hypoxia: Secondary | ICD-10-CM

## 2021-07-04 NOTE — Patient Instructions (Signed)
Your provider has requested that you go to the basement level for lab work in 3 weeks (07/25/2021).Press "B" on the elevator. The lab is located at the first door on the left as you exit the elevator.  You will need follow-up in 3 months. Office will contact you to schedule.   You have been scheduled for a Barium Esophogram at [facility] (1st floor of the hospital) on 07/10/21 at 11:00am. Please arrive 30 minutes prior to your appointment for registration. Make certain not to have anything to eat or drink 3 hours prior to your test. If you need to reschedule for any reason, please contact radiology at [phone] to do so. __________________________________________________________________ A barium swallow is an examination that concentrates on views of the esophagus. This tends to be a double contrast exam (barium and two liquids which, when combined, create a gas to distend the wall of the oesophagus) or single contrast (non-ionic iodine based). The study is usually tailored to your symptoms so a good history is essential. Attention is paid during the study to the form, structure and configuration of the esophagus, looking for functional disorders (such as aspiration, dysphagia, achalasia, motility and reflux) EXAMINATION You may be asked to change into a gown, depending on the type of swallow being performed. A radiologist and radiographer will perform the procedure. The radiologist will advise you of the type of contrast selected for your procedure and direct you during the exam. You will be asked to stand, sit or lie in several different positions and to hold a small amount of fluid in your mouth before being asked to swallow while the imaging is performed .In some instances you may be asked to swallow barium coated marshmallows to assess the motility of a solid food bolus. The exam can be recorded as a digital or video fluoroscopy procedure. POST PROCEDURE It will take 1-2 days for the  barium to pass through your system. To facilitate this, it is important, unless otherwise directed, to increase your fluids for the next 24-48hrs and to resume your normal diet.  This test typically takes about 30 minutes to perform. _______________________________________________________________  Thank you for choosing me and [facility].  Dr. Rush Landmark

## 2021-07-04 NOTE — Progress Notes (Signed)
[place] VISIT   Primary Care Provider [name], MD [place] [address] [phone]  Referring Provider Dr. Nancie Neas of [facility]   Patient Profile: Kathleen [name] is a 76 y.o. 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 fib (status post prior AVN ablation and PPM on anticoagulation), hypertension, gout, CRI, iron deficiency, family history of pancreas cancer (mother), colon polyps (found on CT colonoscopy in 2021).  The patient presents to the [facility] for an evaluation and management of problem(s) noted below:  Problem List 1. History of colon polyps   2. Abnormal CT scan, colon   3. Iron deficiency   4. Dysphagia, unspecified type   5. Chronic anticoagulation   6. Chronic respiratory failure with hypoxia (HCC)     History of Present Illness This is the patient's first visit to the outpatient [facility] clinic.  She was referred by one of my former colleagues, Dr. Nancie Neas of [facility].  In 2021, the patient was found to have heme positive stool.  She followed up with her primary gastroenterologist and then subsequently with her main [facility] gastroenterologist back in 2021.  She was also experiencing some atypical chest discomforts.  She underwent CT colonoscopy which showed 2 polyps.  She was supposed to undergo a colonoscopy in 2021 however she had significant cardiovascular and pulmonary issues that precluded this from occurring.  When she established care in [place] and subsequently had repeat labs checked in the end of 2022, she was found to have iron deficiency without anemia.  She reached out to Dr. Clydene Laming and subsequently he reach out to me since she has transitioned her care to [facility] to see if endoscopic evaluation could potentially be done locally.  She is being seen today  for further evaluation.  The patient is accompanied by her son.  The patient states that overall from a GI standpoint things are stable.  Very intermittently she may have some abdominal discomfort but it is not anything more than what she is experienced in the past.  Interestingly however, she has developed some newer difficulty with swallowing over the course of the last few months.  She has not had any significant weight loss as a result of this.  She has not changed her diet as a result of this.  She does not regurgitate her food however, it just has a sensation of taking a bit longer than normal for things to pass in the mid chest.  She does not describe having the symptoms previously.  However based on notation in the chart, it looks like she has previously had pH impedance and manometry testing per Dr. Clydene Laming back in 2014.  The patient feels that her lung status is not as good as it had been last year.  She thinks that when she was on higher dose budesonide, this did help her but she had symptoms from that.  She has not seen her pulmonologist since last year and will be due for at least a 17-monthfollow-up in the next few months, and she will work on coordinating that.  She is not seeing any overt GI bleeding (liquid melena or hematochezia or maroon stools).  Her stools have become darker over the course of the last few weeks because she has been taking oral iron after she was prescribed and received it earlier in January (so she is  only been on it for approximately 3 weeks).  Patient does not take significant nonsteroidals or BC/Goody powders.  She is endoscopy nave to both EGD and colonoscopy.  She has had thrush in the past but nothing currently.  GI Review of Systems Positive as above Negative for odynophagia, nausea, vomiting, decreased appetite, early satiety, alteration of bowel habits  Review of Systems General: Denies fevers/chills/weight loss unintentionally HEENT: Denies current oral  lesions Cardiovascular: Denies chest pain Pulmonary: SOB at baseline per her report Gastroenterological: See HPI Genitourinary: Denies darkened urine or hematuria Hematological: Positive for history of easy bruising/bleeding due to anticoagulation Endocrine: Denies temperature intolerance Dermatological: Denies jaundice Psychological: Mood is stable   Medications Current Outpatient Medications  Medication Sig Dispense Refill   acetaminophen (TYLENOL) 650 MG CR tablet Take 1,300 mg by mouth every 8 (eight) hours as needed for pain (take with budesonide).     albuterol (PROVENTIL HFA;VENTOLIN HFA) 108 (90 Base) MCG/ACT inhaler Inhale 2 puffs into the lungs every 4 (four) hours as needed for wheezing or shortness of breath.     arformoterol (BROVANA) 15 MCG/2ML NEBU Take 2 mLs (15 mcg total) by nebulization 2 (two) times daily. 120 mL 11   azelastine (ASTELIN) 0.1 % nasal spray Place 2 sprays into both nostrils 2 (two) times daily.     budesonide (PULMICORT) 0.25 MG/2ML nebulizer solution Take 2 mLs (0.25 mg total) by nebulization 2 (two) times daily. 120 mL 12   Cholecalciferol (VITAMIN D3) 50 MCG (2000 UT) TABS Take 2,000 Units by mouth at bedtime.     cyanocobalamin (,VITAMIN B-12,) 1000 MCG/ML injection Inject 1 mL (1,000 mcg total) into the muscle every 30 (thirty) days. 10 mL 0   docusate sodium (COLACE) 100 MG capsule Take 100 mg by mouth 2 (two) times daily.     dofetilide (TIKOSYN) 250 MCG capsule Take 250 mcg by mouth in the morning and at bedtime.     ezetimibe (ZETIA) 10 MG tablet Take 10 mg by mouth at bedtime.     febuxostat (ULORIC) 40 MG tablet Take 40 mg by mouth at bedtime.     Ferric Maltol (ACCRUFER) 30 MG CAPS Take 1 capsule by mouth in the morning and at bedtime. 180 capsule 1   guaiFENesin (MUCINEX) 600 MG 12 hr tablet Take 600 mg by mouth as needed.     ipratropium-albuterol (DUONEB) 0.5-2.5 (3) MG/3ML SOLN Inhale 3 mLs into the lungs every 6 (six) hours as needed  (wheezing/shortness of breath).     levothyroxine (SYNTHROID) 50 MCG tablet Take 50 mcg by mouth daily before breakfast.  2   linaCLOtide (LINZESS PO) Take 145 mg by mouth as needed.     losartan (COZAAR) 25 MG tablet Take 1 tablet (25 mg total) by mouth at bedtime. 30 tablet 6   Magnesium 500 MG TABS Take 500 mg by mouth at bedtime.     OXYGEN Inhale 4 L into the lungs continuous. Use with resmed ventilator     rivaroxaban (XARELTO) 20 MG TABS tablet Take 20 mg by mouth daily with supper.      spironolactone (ALDACTONE) 25 MG tablet Take 1 tablet (25 mg total) by mouth every morning. 30 tablet 3   torsemide (DEMADEX) 20 MG tablet Take 36m (1 Tab) Monday and Friday 60 tablet 3   YUPELRI 175 MCG/3ML nebulizer solution USE 1 VIAL IN NEBULIZER DAILY - DO NOT MIX WITH OTHER NEB MEDS,USE BEFORE OR AFTER 30 mL 11   No current facility-administered medications  for this visit.    Allergies Allergies  Allergen Reactions   Allopurinol Other (See Comments)    Reaction:  Dizziness    Clindamycin Anaphylaxis and Hives   Pneumococcal 13-Val Conj Vacc Itching, Swelling and Rash   Dronedarone Rash   Entresto [Sacubitril-Valsartan] Other (See Comments)    hypotension   Fosamax [Alendronate Sodium] Nausea Only   Meperidine Nausea And Vomiting   Rosuvastatin Other (See Comments)    Reaction:  Muscle spasms    Tetracycline Hives   Adhesive [Tape] Rash   Lovastatin Rash    Other reaction(s): Muscle Pain    Histories 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[place])    CHF (congestive heart failure) (HCC)    Chronic renal insufficiency    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    COPD (chronic obstructive pulmonary disease) (HCC)    Gout    Hypertension    Longstanding persistent atrial fibrillation (HCC)    Nonischemic cardiomyopathy (HCC)    Obesity    Pacemaker    Spontaneous  pneumothorax 2013   Past Surgical History:  Procedure Laterality Date   ABDOMINAL HYSTERECTOMY     ATRIAL FIBRILLATION ABLATION  07/20/2013   by Dr Westley Gambles   AV nodal ablation  11/01/2013   by Dr Westley Gambles, repeated by Dr Dwana Curd   BREAST BIOPSY Bilateral 1997   negative   CARDIAC CATHETERIZATION     CHOLECYSTECTOMY     HERNIA REPAIR     PACEMAKER INSERTION  06/2017   MDT Viva CRT-P implanted by Dr Westley Gambles after AV nodal ablation,  LV lead could not be placed   PACEMAKER INSERTION  05/2020   with lead bundle   Social History   Socioeconomic History   Marital status: Widowed    Spouse name: Not on file   Number of children: Not on file   Years of education: Not on file   Highest education level: Not on file  Occupational History   Occupation: retired  Tobacco Use   Smoking status: Former    Types: Cigarettes    Quit date: 06/09/1987    Years since quitting: 34.1   Smokeless tobacco: Never  Vaping Use   Vaping Use: Never used  Substance and Sexual Activity   Alcohol use: No    Alcohol/week: 0.0 standard drinks   Drug use: No   Sexual activity: Not Currently  Other Topics Concern   Not on file  Social History Narrative   Pt lives in [place] alone.  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  Her son works in [employer].   Social Determinants of Health   Financial Resource Strain: Not on file  Food Insecurity: Not on file  Transportation Needs: Not on file  Physical Activity: Not on file  Stress: Not on file  Social Connections: Not on file  Intimate Partner Violence: Not on file   Family History  Problem Relation Age of Onset   Breast cancer Mother 19       3 different times   Pancreatic cancer Mother    Breast cancer Cousin    Colon cancer Neg Hx    Esophageal cancer Neg Hx    Stomach cancer Neg Hx    Inflammatory bowel disease Neg Hx    Liver disease Neg Hx    Rectal cancer Neg Hx    I  have reviewed her medical, social, and family history in  detail and updated the electronic medical record as necessary.    PHYSICAL EXAMINATION  BP 126/78    Pulse 80    Ht 5' (1.524 m)    Wt 207 lb 3.2 oz (94 kg)    BMI 40.47 kg/m  Wt Readings from Last 3 Encounters:  07/04/21 207 lb 3.2 oz (94 kg)  06/24/21 206 lb 12.8 oz (93.8 kg)  05/23/21 208 lb (94.3 kg)  GEN: NAD, accompanied by son, appears older than stated age, appears chronically ill but is nontoxic PSYCH: Cooperative, without pressured speech EYE: Conjunctivae pale-pink ENT: MMM, without oral ulcers, no thrush NECK: Enlarged neck girth CV: Nontachycardic RESP: Some wheezing present GI: NABS, soft, rounded, obese, NT, without rebound MSK/EXT: Bilateral lower extremity edema present SKIN: No jaundice NEURO:  Alert & Oriented x 3, no focal deficits   REVIEW OF DATA  I reviewed the following data at the time of this encounter:  GI Procedures and Studies  No prior colonoscopies or upper endoscopies  Laboratory Studies  Reviewed those in epic and care everywhere  Imaging Studies  September 2021 CT colonoscopy ([facility]) Findings:  The prep is adequate, with minimal residual fluid and stool in the colon.  There is adequate gaseous distention of the colon. No masses are identified within the colon. In the mid transverse colon, there is a 10 mm polyp (series 3, image 233). In the distal transverse colon there is an additional 9 mm polyp (series 3, image 153).   Evaluation of the soft tissues of the abdomen and pelvis is limited due to the lack of contrast material and the low radiation dose protocol.    Incidental findings include AICD leads within the right ventricular apex.  Multiple hypoattenuating liver lesions are unchanged. Exophytic left renal cyst.  Impression: Two transverse colon polyps measuring up to 10 mm.    ASSESSMENT  Ms. Demo is a 76 y.o. 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 fib (status post prior AVN ablation and PPM on anticoagulation), hypertension, gout, CRI, iron deficiency, family history of pancreas cancer (mother), colon polyps (found on CT colonoscopy in 2021).  The patient is seen today for evaluation and management of:  1. History of colon polyps   2. Abnormal CT scan, colon   3. Iron deficiency   4. Dysphagia, unspecified type   5. Chronic anticoagulation   6. Chronic respiratory failure with hypoxia (HCC)    This is the patient is hemodynamically stable.  Clinically, she has evidence of iron deficiency without anemia.  She also has a history of multiple colon polyps on previous CT colonography that were never removed as a result of progressive systemic disorders that precluded her from safe anesthesia.  She has pretty significant underlying lung disease as well as tracheomalacia.  She is an extremely high risk individual for anesthesia related procedures.  Certainly in the setting of iron deficiency of unclear etiology, we often need to exclude chronic GI blood loss.  In the setting of her previous heme positive stool and known colon polyps, certainly we need to keep this in mind.  The likelihood of the 2 small 10 mm polyps having progressed to a cancer is quite low in such a short period in time, but nothing in the medicine is perfect or absolute.  In the setting of her newer onset dysphagia symptoms, endoscopic evaluation  from above would normally be recommended but again she has a high risk for issues from an anesthesia standpoint.  We will begin her further work-up by pursuing a barium swallow.  We will subsequently see in a few more weeks time, how her iron levels have responded to oral iron therapy.  She needs to follow-up with her pulmonologist, such that we can see if any further optimization from a respiratory standpoint can be performed/occur.  She is already received the previous okay from cardiology for undergoing endoscopic evaluation and so  we will keep that in mind.  If she remains a high risk individual, there may be some role for considering repeating her CT colonography but time will tell.  Lets see how she has done with iron therapy and how she does and follow-up with pulmonary.  We will set her up a follow-up in approximately 6 weeks and go from there.  She and her son agree that she is a high risk individual and her not sure that they would want to undergo procedures based on her current medical status but certainly do not want to miss a cancer if something is present.  All patient questions were answered to the best of my ability, and the patient agrees to the aforementioned plan of action with follow-up as indicated.   PLAN  Iron indices and celiac panel to be obtained in 3 to 5 weeks Proceed with scheduling barium swallow Holding on endoscopic evaluation from above/below for now but could consider in future May consider repeat CT colonography Appreciate pulmonary follow-up for further respiratory optimization, if endoscopic evaluation needs to be pursued   Orders Placed This Encounter  Procedures   DG ESOPHAGUS W SINGLE CM (SOL OR THIN BA)   CBC   Comp Met (CMET)   IBC + Ferritin   IgA   Tissue transglutaminase, IgA    New Prescriptions   No medications on file   Modified Medications   No medications on file    Planned Follow Up No follow-ups on file.   Total Time in Face-to-Face and in Coordination of Care for patient including independent/personal interpretation/review of prior testing, medical history, examination, medication adjustment, communicating results with the patient directly, and documentation within the EHR is 45 minutes.   Justice Britain, MD [facility] Office # [phone]

## 2021-07-07 ENCOUNTER — Encounter: Payer: Self-pay | Admitting: Gastroenterology

## 2021-07-07 DIAGNOSIS — R1310 Dysphagia, unspecified: Secondary | ICD-10-CM | POA: Insufficient documentation

## 2021-07-07 DIAGNOSIS — E611 Iron deficiency: Secondary | ICD-10-CM | POA: Insufficient documentation

## 2021-07-07 DIAGNOSIS — Z860100 Personal history of colon polyps, unspecified: Secondary | ICD-10-CM | POA: Insufficient documentation

## 2021-07-07 DIAGNOSIS — R933 Abnormal findings on diagnostic imaging of other parts of digestive tract: Secondary | ICD-10-CM | POA: Insufficient documentation

## 2021-07-07 DIAGNOSIS — Z7901 Long term (current) use of anticoagulants: Secondary | ICD-10-CM | POA: Insufficient documentation

## 2021-07-08 ENCOUNTER — Other Ambulatory Visit (HOSPITAL_COMMUNITY): Payer: Self-pay | Admitting: *Deleted

## 2021-07-08 MED ORDER — DOFETILIDE 250 MCG PO CAPS: 250.0000 ug | ORAL_CAPSULE | Freq: Two times a day (BID) | ORAL | 2 refills | Status: DC

## 2021-07-10 ENCOUNTER — Other Ambulatory Visit: Payer: Self-pay

## 2021-07-10 ENCOUNTER — Ambulatory Visit (HOSPITAL_COMMUNITY)
Admission: RE | Admit: 2021-07-10 | Discharge: 2021-07-10 | Disposition: A | Discharge status: Home / Self Care | Payer: Medicare Other | Source: Ambulatory Visit | Attending: Gastroenterology | Admitting: Gastroenterology

## 2021-07-10 DIAGNOSIS — Z860100 Personal history of colon polyps, unspecified: Secondary | ICD-10-CM

## 2021-07-10 DIAGNOSIS — Z7901 Long term (current) use of anticoagulants: Secondary | ICD-10-CM

## 2021-07-10 DIAGNOSIS — E611 Iron deficiency: Secondary | ICD-10-CM

## 2021-07-10 DIAGNOSIS — R1310 Dysphagia, unspecified: Secondary | ICD-10-CM | POA: Diagnosis present

## 2021-07-11 ENCOUNTER — Telehealth: Payer: Self-pay | Admitting: Internal Medicine

## 2021-07-11 NOTE — Telephone Encounter (Signed)
Kathleen [name] states pt has been in a lot of pain and is getting worse. Pt is convinced its breathing medication.   Wanting provider to check and see if there are any other pain meds or if he can change rx.  Reports trouble sleeping, loss of appetite.

## 2021-07-14 NOTE — Telephone Encounter (Signed)
Called patient and she states the pain is in all her joints.  She stopped her budesonide as she was thinking her pain was coming from that. She stopped the medication on 07/11/21. Please advise. Patient is requesting a call back

## 2021-07-30 ENCOUNTER — Other Ambulatory Visit (INDEPENDENT_AMBULATORY_CARE_PROVIDER_SITE_OTHER): Payer: Medicare Other

## 2021-07-30 DIAGNOSIS — E611 Iron deficiency: Secondary | ICD-10-CM | POA: Diagnosis not present

## 2021-07-30 DIAGNOSIS — Z7901 Long term (current) use of anticoagulants: Secondary | ICD-10-CM

## 2021-07-30 DIAGNOSIS — Z86010 Personal history of colonic polyps: Secondary | ICD-10-CM

## 2021-07-30 DIAGNOSIS — R1310 Dysphagia, unspecified: Secondary | ICD-10-CM

## 2021-07-30 LAB — CBC
HCT: 39.9 % (ref 36.0–46.0)
Hemoglobin: 12.9 g/dL (ref 12.0–15.0)
MCHC: 32.3 g/dL (ref 30.0–36.0)
MCV: 80.1 fl (ref 78.0–100.0)
Platelets: 270 10*3/uL (ref 150.0–400.0)
RBC: 4.98 Mil/uL (ref 3.87–5.11)
RDW: 17.1 % — ABNORMAL HIGH (ref 11.5–15.5)
WBC: 8.1 10*3/uL (ref 4.0–10.5)

## 2021-07-30 LAB — COMPREHENSIVE METABOLIC PANEL
ALT: 10 U/L (ref 0–35)
AST: 15 U/L (ref 0–37)
Albumin: 4.4 g/dL (ref 3.5–5.2)
Alkaline Phosphatase: 58 U/L (ref 39–117)
BUN: 15 mg/dL (ref 6–23)
CO2: 29 mEq/L (ref 19–32)
Calcium: 9.7 mg/dL (ref 8.4–10.5)
Chloride: 102 mEq/L (ref 96–112)
Creatinine, Ser: 0.73 mg/dL (ref 0.40–1.20)
GFR: 80.92 mL/min (ref >60.00)
Glucose, Bld: 142 mg/dL — ABNORMAL HIGH (ref 70–99)
Potassium: 4.2 mEq/L (ref 3.5–5.1)
Sodium: 137 mEq/L (ref 135–145)
Total Bilirubin: 0.7 mg/dL (ref 0.2–1.2)
Total Protein: 7.1 g/dL (ref 6.0–8.3)

## 2021-07-30 LAB — IBC + FERRITIN
Ferritin: 131.6 ng/mL (ref 10.0–291.0)
Iron: 62 ug/dL (ref 42–145)
Saturation Ratios: 14.5 % — ABNORMAL LOW (ref 20.0–50.0)
TIBC: 428.4 ug/dL (ref 250.0–450.0)
Transferrin: 306 mg/dL (ref 212.0–360.0)

## 2021-07-31 LAB — IGA: Immunoglobulin A: 118 mg/dL (ref 70–320)

## 2021-07-31 LAB — TISSUE TRANSGLUTAMINASE, IGA: (tTG) Ab, IgA: <1 U/mL

## 2021-08-03 ENCOUNTER — Encounter: Payer: Self-pay | Admitting: Internal Medicine

## 2021-08-07 ENCOUNTER — Other Ambulatory Visit: Payer: Self-pay

## 2021-08-07 ENCOUNTER — Ambulatory Visit (INDEPENDENT_AMBULATORY_CARE_PROVIDER_SITE_OTHER): Payer: Medicare Other

## 2021-08-07 ENCOUNTER — Encounter: Payer: Self-pay | Admitting: Internal Medicine

## 2021-08-07 ENCOUNTER — Ambulatory Visit (INDEPENDENT_AMBULATORY_CARE_PROVIDER_SITE_OTHER): Payer: Medicare Other | Admitting: Internal Medicine

## 2021-08-07 VITALS — BP 134/82 | HR 73 | Temp 98.3°F | Resp 20 | Ht 60.0 in | Wt 199.0 lb

## 2021-08-07 DIAGNOSIS — R058 Other specified cough: Secondary | ICD-10-CM

## 2021-08-07 DIAGNOSIS — E785 Hyperlipidemia, unspecified: Secondary | ICD-10-CM | POA: Insufficient documentation

## 2021-08-07 DIAGNOSIS — J22 Unspecified acute lower respiratory infection: Secondary | ICD-10-CM

## 2021-08-07 DIAGNOSIS — I10 Essential (primary) hypertension: Secondary | ICD-10-CM

## 2021-08-07 DIAGNOSIS — E1140 Type 2 diabetes mellitus with diabetic neuropathy, unspecified: Secondary | ICD-10-CM

## 2021-08-07 DIAGNOSIS — R059 Cough, unspecified: Secondary | ICD-10-CM | POA: Insufficient documentation

## 2021-08-07 DIAGNOSIS — B372 Candidiasis of skin and nail: Secondary | ICD-10-CM

## 2021-08-07 DIAGNOSIS — I5032 Chronic diastolic (congestive) heart failure: Secondary | ICD-10-CM

## 2021-08-07 DIAGNOSIS — N1831 Chronic kidney disease, stage 3a: Secondary | ICD-10-CM

## 2021-08-07 LAB — URINALYSIS, ROUTINE W REFLEX MICROSCOPIC
Bilirubin Urine: NEGATIVE
Hgb urine dipstick: NEGATIVE
Nitrite: NEGATIVE
Specific Gravity, Urine: 1.015 (ref 1.000–1.030)
Total Protein, Urine: NEGATIVE
Urine Glucose: NEGATIVE
Urobilinogen, UA: 1 (ref 0.0–1.0)
pH: 6.5 (ref 5.0–8.0)

## 2021-08-07 LAB — HEMOGLOBIN A1C: Hgb A1c MFr Bld: 5.9 % (ref 4.6–6.5)

## 2021-08-07 LAB — LIPID PANEL
Cholesterol: 174 mg/dL (ref 0–200)
HDL: 35 mg/dL — ABNORMAL LOW (ref >39.00)
LDL Cholesterol: 99 mg/dL (ref 0–99)
NonHDL: 139.07
Total CHOL/HDL Ratio: 5
Triglycerides: 200 mg/dL — ABNORMAL HIGH (ref 0.0–149.0)
VLDL: 40 mg/dL (ref 0.0–40.0)

## 2021-08-07 LAB — MICROALBUMIN / CREATININE URINE RATIO
Creatinine,U: 138.2 mg/dL
Microalb Creat Ratio: 0.6 mg/g (ref 0.0–30.0)
Microalb, Ur: 0.8 mg/dL (ref 0.0–1.9)

## 2021-08-07 MED ORDER — CEFDINIR 300 MG PO CAPS: 300.0000 mg | ORAL_CAPSULE | Freq: Two times a day (BID) | ORAL | 0 refills | Status: AC

## 2021-08-07 MED ORDER — EMPAGLIFLOZIN 10 MG PO TABS: 10.0000 mg | ORAL_TABLET | Freq: Every day | ORAL | 1 refills | Status: DC

## 2021-08-07 MED ORDER — FLUCONAZOLE 150 MG PO TABS: 150.0000 mg | ORAL_TABLET | ORAL | 0 refills | Status: DC

## 2021-08-07 MED ORDER — EZETIMIBE 10 MG PO TABS: 10.0000 mg | ORAL_TABLET | Freq: Every day | ORAL | 1 refills | Status: DC

## 2021-08-07 NOTE — Progress Notes (Signed)
Subjective:  Patient ID: Kathleen [name], female    DOB: May 07, 1946  Age: 76 y.o. MRN: [id number]  CC: COPD, Rash, Hyperlipidemia, Cough, and Diabetes  This visit occurred during the SARS-CoV-2 public health emergency.  Safety protocols were in place, including screening questions prior to the visit, additional usage of staff PPE, and extensive cleaning of exam room while observing appropriate contact time as indicated for disinfecting solutions.    HPI Kathleen [name] presents for f/up -  She complains of a 10-day history of cough productive of thick yellow phlegm with worsening shortness of breath and chills.  Outpatient Medications Prior to Visit  Medication Sig Dispense Refill   acetaminophen (TYLENOL) 650 MG CR tablet Take 1,300 mg by mouth every 8 (eight) hours as needed for pain (take with budesonide).     albuterol (PROVENTIL HFA;VENTOLIN HFA) 108 (90 Base) MCG/ACT inhaler Inhale 2 puffs into the lungs every 4 (four) hours as needed for wheezing or shortness of breath.     arformoterol (BROVANA) 15 MCG/2ML NEBU Take 2 mLs (15 mcg total) by nebulization 2 (two) times daily. 120 mL 11   azelastine (ASTELIN) 0.1 % nasal spray Place 2 sprays into both nostrils 2 (two) times daily.     budesonide (PULMICORT) 0.25 MG/2ML nebulizer solution Take 2 mLs (0.25 mg total) by nebulization 2 (two) times daily. 120 mL 12   Cholecalciferol (VITAMIN D3) 50 MCG (2000 UT) TABS Take 2,000 Units by mouth at bedtime.     cyanocobalamin (,VITAMIN B-12,) 1000 MCG/ML injection Inject 1 mL (1,000 mcg total) into the muscle every 30 (thirty) days. 10 mL 0   docusate sodium (COLACE) 100 MG capsule Take 100 mg by mouth 2 (two) times daily.     dofetilide (TIKOSYN) 250 MCG capsule Take 1 capsule (250 mcg total) by mouth in the morning and at bedtime. 60 capsule 2   febuxostat (ULORIC) 40 MG tablet Take 40 mg by mouth at bedtime.     Ferric Maltol (ACCRUFER) 30 MG CAPS Take 1 capsule by mouth in the morning and  at bedtime. 180 capsule 1   guaiFENesin (MUCINEX) 600 MG 12 hr tablet Take 600 mg by mouth as needed.     ipratropium-albuterol (DUONEB) 0.5-2.5 (3) MG/3ML SOLN Inhale 3 mLs into the lungs every 6 (six) hours as needed (wheezing/shortness of breath).     levothyroxine (SYNTHROID) 50 MCG tablet Take 50 mcg by mouth daily before breakfast.  2   linaCLOtide (LINZESS PO) Take 145 mg by mouth as needed.     losartan (COZAAR) 25 MG tablet Take 1 tablet (25 mg total) by mouth at bedtime. 30 tablet 6   Magnesium 500 MG TABS Take 500 mg by mouth at bedtime.     OXYGEN Inhale 4 L into the lungs continuous. Use with resmed ventilator     spironolactone (ALDACTONE) 25 MG tablet Take 1 tablet (25 mg total) by mouth every morning. 30 tablet 3   torsemide (DEMADEX) 20 MG tablet Take 20mg  (1 Tab) Monday and Friday 60 tablet 3   YUPELRI 175 MCG/3ML nebulizer solution USE 1 VIAL IN NEBULIZER DAILY - DO NOT MIX WITH OTHER NEB MEDS,USE BEFORE OR AFTER 30 mL 11   ezetimibe (ZETIA) 10 MG tablet Take 10 mg by mouth at bedtime.     rivaroxaban (XARELTO) 20 MG TABS tablet Take 20 mg by mouth daily with supper.      No facility-administered medications prior to visit.    ROS Review of  Systems  Constitutional:  Positive for chills. Negative for fatigue and fever.  HENT: Negative.    Respiratory:  Positive for cough and shortness of breath. Negative for wheezing.   Cardiovascular:  Negative for chest pain, palpitations and leg swelling.  Gastrointestinal:  Negative for abdominal pain, constipation, diarrhea, nausea and vomiting.  Endocrine: Negative.   Genitourinary: Negative.  Negative for difficulty urinating and dysuria.  Musculoskeletal:  Positive for arthralgias. Negative for myalgias.  Skin:  Positive for color change and rash.  Neurological:  Negative for dizziness, weakness and headaches.  Hematological:  Negative for adenopathy. Does not bruise/bleed easily.  Psychiatric/Behavioral: Negative.      Objective:  BP 134/82 (BP Location: Left Wrist, Patient Position: Sitting, Cuff Size: Normal)    Pulse 73    Temp 98.3 F (36.8 C) (Oral)    Resp 20    Ht 5' (1.524 m)    Wt 199 lb (90.3 kg)    SpO2 97%    BMI 38.86 kg/m   BP Readings from Last 3 Encounters:  08/07/21 134/82  07/04/21 126/78  06/24/21 (!) 144/76    Wt Readings from Last 3 Encounters:  08/07/21 199 lb (90.3 kg)  07/04/21 207 lb 3.2 oz (94 kg)  06/24/21 206 lb 12.8 oz (93.8 kg)    Physical Exam Vitals reviewed.  Constitutional:      General: She is not in acute distress.    Appearance: She is obese. She is not ill-appearing, toxic-appearing or diaphoretic.  HENT:     Nose: Nose normal.     Mouth/Throat:     Mouth: Mucous membranes are moist.  Eyes:     General: No scleral icterus.    Conjunctiva/sclera: Conjunctivae normal.  Cardiovascular:     Rate and Rhythm: Normal rate and regular rhythm.     Heart sounds: No murmur heard. Pulmonary:     Effort: Pulmonary effort is normal.     Breath sounds: No stridor. No wheezing, rhonchi or rales.  Abdominal:     General: Abdomen is protuberant.     Palpations: There is no mass.     Tenderness: There is no abdominal tenderness. There is no guarding.     Hernia: No hernia is present.  Musculoskeletal:        General: Normal range of motion.     Cervical back: Neck supple.     Right lower leg: No edema.     Left lower leg: No edema.  Skin:    General: Skin is warm.     Coloration: Skin is not jaundiced.     Findings: Erythema and rash present.       Neurological:     General: No focal deficit present.     Mental Status: She is alert.    Lab Results  Component Value Date   WBC 8.1 07/30/2021   HGB 12.9 07/30/2021   HCT 39.9 07/30/2021   PLT 270.0 07/30/2021   GLUCOSE 142 (H) 07/30/2021   CHOL 174 08/07/2021   TRIG 200.0 (H) 08/07/2021   HDL 35.00 (L) 08/07/2021   LDLCALC 99 08/07/2021   ALT 10 07/30/2021   AST 15 07/30/2021   NA 137  07/30/2021   K 4.2 07/30/2021   CL 102 07/30/2021   CREATININE 0.73 07/30/2021   BUN 15 07/30/2021   CO2 29 07/30/2021   TSH 1.502 04/14/2021   INR 1.4 (H) 05/10/2021   HGBA1C 5.9 08/07/2021   MICROALBUR 0.8 08/07/2021    DG ESOPHAGUS W  SINGLE CM (SOL OR THIN BA)  Result Date: 07/10/2021 CLINICAL DATA:  Dysphagia. EXAM: ESOPHOGRAM / BARIUM SWALLOW / BARIUM TABLET STUDY TECHNIQUE: Combined double contrast and single contrast examination performed using effervescent crystals, thick barium liquid, and thin barium liquid. The patient was observed with fluoroscopy swallowing a 13 mm barium sulphate tablet. FLUOROSCOPY: Radiation Exposure Index (as provided by the fluoroscopic device): 20.2 mGy mGy Kerma COMPARISON:  July 25, 2019. FINDINGS: No mass or stricture is noted in the esophagus. No hiatal hernia or reflux is noted. Barium tablet passed through esophagus and into stomach without difficulty or delay. IMPRESSION: No definite abnormality seen in the esophagus. Electronically Signed   By: Marijo Conception M.D.   On: 07/10/2021 11:43   DG Chest 2 View  Result Date: 08/07/2021 CLINICAL DATA:  Productive cough for 1.5 weeks. EXAM: CHEST - 2 VIEW COMPARISON:  AP chest 07/11/2020 FINDINGS: Left chest wall cardiac pacer is again seen with leads overlying the right atrium right ventricle and likely coronary sinus. Cardiac silhouette is again moderately enlarged. Mediastinal contours are within normal limits. Small left and minimal right pleural effusions, similar to prior. Bibasilar mild airspace opacities possible atelectasis. No pneumothorax is seen. A prior electronic device overlying the inferior medial aspect of the cardiac pacer on the prior frontal radiograph is no longer visualized. The right humeral head is high-riding and contacts the undersurface of the acromion, again indicating likely superior full-thickness rotator cuff tear. IMPRESSION:: IMPRESSION: 1. Stable cardiomegaly. 2. Small left and  trace right pleural effusions with associated likely bibasilar atelectasis. Electronically Signed   By: Yvonne Kendall M.D.   On: 08/07/2021 12:25     Assessment & Plan:   Kathleen [name] was seen today for copd, rash, hyperlipidemia, cough and diabetes.  Diagnoses and all orders for this visit:  Primary hypertension- Her blood pressure is well controlled. -     Urinalysis, Routine w reflex microscopic; Future -     Urinalysis, Routine w reflex microscopic  Type 2 diabetes mellitus with diabetic neuropathy, without long-term current use of insulin ([place])- Her blood sugar is well controlled. -     Microalbumin / creatinine urine ratio; Future -     Hemoglobin A1c; Future -     Ambulatory referral to Ophthalmology -     Hemoglobin A1c -     Microalbumin / creatinine urine ratio  Stage 3a chronic kidney disease ([place])- Her renal function is stable. -     Microalbumin / creatinine urine ratio; Future -     Urinalysis, Routine w reflex microscopic; Future -     Urinalysis, Routine w reflex microscopic -     Microalbumin / creatinine urine ratio  Hyperlipidemia with target LDL less than 100 -     Lipid panel; Future -     Discontinue: ezetimibe (ZETIA) 10 MG tablet; Take 1 tablet (10 mg total) by mouth at bedtime. -     Lipid panel  Chronic heart failure with preserved ejection fraction ([place])- Will add Jardiance for cardiovascular risk reduction. -     empagliflozin (JARDIANCE) 10 MG TABS tablet; Take 1 tablet (10 mg total) by mouth daily before breakfast.  Candidiasis of skin -     fluconazole (DIFLUCAN) 150 MG tablet; Take 1 tablet (150 mg total) by mouth once a week.  Cough productive of purulent sputum- Her chest x-ray is negative for mass or infiltrate.  We will treat for lower respiratory tract infection with cefdinir. -     DG  Chest 2 View; Future  LRTI (lower respiratory tract infection) -     cefdinir (OMNICEF) 300 MG capsule; Take 1 capsule (300 mg total) by mouth 2 (two) times daily  for 7 days.   I have discontinued Kathleen [name]. Kathleen [name]'s ezetimibe and ezetimibe. I am also having her start on fluconazole, empagliflozin, cefdinir, and Nexletol. Additionally, I am having her maintain her levothyroxine, albuterol, febuxostat, Magnesium, azelastine, Vitamin D3, ipratropium-albuterol, docusate sodium, losartan, arformoterol, cyanocobalamin, Yupelri, OXYGEN, acetaminophen, ACCRUFeR, spironolactone, budesonide, guaiFENesin, linaCLOtide (LINZESS PO), torsemide, and dofetilide.  Meds ordered this encounter  Medications   DISCONTD: ezetimibe (ZETIA) 10 MG tablet    Sig: Take 1 tablet (10 mg total) by mouth at bedtime.    Dispense:  90 tablet    Refill:  1   fluconazole (DIFLUCAN) 150 MG tablet    Sig: Take 1 tablet (150 mg total) by mouth once a week.    Dispense:  6 tablet    Refill:  0   empagliflozin (JARDIANCE) 10 MG TABS tablet    Sig: Take 1 tablet (10 mg total) by mouth daily before breakfast.    Dispense:  90 tablet    Refill:  1   cefdinir (OMNICEF) 300 MG capsule    Sig: Take 1 capsule (300 mg total) by mouth 2 (two) times daily for 7 days.    Dispense:  14 capsule    Refill:  0   Bempedoic Acid (NEXLETOL) 180 MG TABS    Sig: Take 1 tablet by mouth daily.    Dispense:  90 tablet    Refill:  1     Follow-up: Return in about 3 months (around 11/07/2021).  Scarlette Calico, MD

## 2021-08-07 NOTE — Patient Instructions (Signed)
Type 2 Diabetes Mellitus, Diagnosis, Adult ?Type 2 diabetes (type 2 diabetes mellitus) is a long-term, or chronic, disease. In type 2 diabetes, one or both of these problems may be present: ?The pancreas does not make enough of a hormone called insulin. ?Cells in the body do not respond properly to the insulin that the body makes (insulin resistance). ?Normally, insulin allows blood sugar (glucose) to enter cells in the body. The cells use glucose for energy. Insulin resistance or lack of insulin causes excess glucose to build up in the blood instead of going into cells. This causes high blood glucose (hyperglycemia).  ?What are the causes? ?The exact cause of type 2 diabetes is not known. ?What increases the risk? ?The following factors may make you more likely to develop this condition: ?Having a family member with type 2 diabetes. ?Being overweight or obese. ?Being inactive (sedentary). ?Having been diagnosed with insulin resistance. ?Having a history of prediabetes, diabetes when you were pregnant (gestational diabetes), or polycystic ovary syndrome (PCOS). ?What are the signs or symptoms? ?In the early stage of this condition, you may not have symptoms. Symptoms develop slowly and may include: ?Increased thirst or hunger. ?Increased urination. ?Unexplained weight loss. ?Tiredness (fatigue) or weakness. ?Vision changes, such as blurry vision. ?Dark patches on the skin. ?How is this diagnosed? ?This condition is diagnosed based on your symptoms, your medical history, a physical exam, and your blood glucose level. Your blood glucose may be checked with one or more of the following blood tests: ?A fasting blood glucose (FBG) test. You will not be allowed to eat (you will fast) for 8 hours or longer before a blood sample is taken. ?A random blood glucose test. This test checks blood glucose at any time of day regardless of when you ate. ?An A1C (hemoglobin A1C) blood test. This test provides information about blood  glucose levels over the previous 2-3 months. ?An oral glucose tolerance test (OGTT). This test measures your blood glucose at two times: ?After fasting. This is your baseline blood glucose level. ?Two hours after drinking a beverage that contains glucose. ?You may be diagnosed with type 2 diabetes if: ?Your fasting blood glucose level is 126 mg/dL (7.0 mmol/L) or higher. ?Your random blood glucose level is 200 mg/dL (11.1 mmol/L) or higher. ?Your A1C level is 6.5% or higher. ?Your oral glucose tolerance test result is higher than 200 mg/dL (11.1 mmol/L). ?These blood tests may be repeated to confirm your diagnosis. ?How is this treated? ?Your treatment may be managed by a specialist called an endocrinologist. Type 2 diabetes may be treated by following instructions from your health care provider about: ?Making dietary and lifestyle changes. These may include: ?Following a personalized nutrition plan that is developed by a registered dietitian. ?Exercising regularly. ?Finding ways to manage stress. ?Checking your blood glucose level as often as told. ?Taking diabetes medicines or insulin daily. This helps to keep your blood glucose levels in the healthy range. ?Taking medicines to help prevent complications from diabetes. Medicines may include: ?Aspirin. ?Medicine to lower cholesterol. ?Medicine to control blood pressure. ?Your health care provider will set treatment goals for you. Your goals will be based on your age, other medical conditions you have, and how you respond to diabetes treatment. Generally, the goal of treatment is to maintain the following blood glucose levels: ?Before meals: 80-130 mg/dL (4.4-7.2 mmol/L). ?After meals: below 180 mg/dL (10 mmol/L). ?A1C level: less than 7%. ?Follow these instructions at home: ?Questions to ask your health care provider ?  Consider asking the following questions: ?Should I meet with a certified diabetes care and education specialist? ?What diabetes medicines do I need,  and when should I take them? ?What equipment will I need to manage my diabetes at home? ?How often do I need to check my blood glucose? ?Where can I find a support group for people with diabetes? ?What number can I call if I have questions? ?When is my next appointment? ?General instructions ?Take over-the-counter and prescription medicines only as told by your health care provider. ?Keep all follow-up visits. This is important. ?Where to find more information ?For help and guidance and for more information about diabetes, please visit: ?American Diabetes Association (ADA): www.diabetes.org ?American Association of Diabetes Care and Education Specialists (ADCES): www.diabeteseducator.org ?International Diabetes Federation (IDF): www.idf.org ?Contact a health care provider if: ?Your blood glucose is at or above 240 mg/dL (13.3 mmol/L) for 2 days in a row. ?You have been sick or have had a fever for 2 days or longer, and you are not getting better. ?You have any of the following problems for more than 6 hours: ?You cannot eat or drink. ?You have nausea and vomiting. ?You have diarrhea. ?Get help right away if: ?You have severe hypoglycemia. This means your blood glucose is lower than 54 mg/dL (3.0 mmol/L). ?You become confused or you have trouble thinking clearly. ?You have difficulty breathing. ?You have moderate or large ketone levels in your urine. ?These symptoms may represent a serious problem that is an emergency. Do not wait to see if the symptoms will go away. Get medical help right away. Call your local emergency services (911 in the [place] Do not drive yourself to the hospital. ?Summary ?Type 2 diabetes mellitus is a long-term, or chronic, disease. In type 2 diabetes, the pancreas does not make enough of a hormone called insulin, or cells in the body do not respond properly to insulin that the body makes. ?This condition is treated by making dietary and lifestyle changes and taking diabetes medicines or  insulin. ?Your health care provider will set treatment goals for you. Your goals will be based on your age, other medical conditions you have, and how you respond to diabetes treatment. ?Keep all follow-up visits. This is important. ?This information is not intended to replace advice given to you by your health care provider. Make sure you discuss any questions you have with your health care provider. ?Document Revised: 08/19/2020 Document Reviewed: 08/19/2020 ?Elsevier Patient Education ? 2022 [employer] ? ?

## 2021-08-08 ENCOUNTER — Other Ambulatory Visit (HOSPITAL_COMMUNITY): Payer: Self-pay | Admitting: *Deleted

## 2021-08-08 ENCOUNTER — Encounter: Payer: Self-pay | Admitting: Internal Medicine

## 2021-08-08 MED ORDER — NEXLETOL 180 MG PO TABS: 1.0000 | ORAL_TABLET | Freq: Every day | ORAL | 1 refills | Status: DC

## 2021-08-08 MED ORDER — RIVAROXABAN 20 MG PO TABS: 20.0000 mg | ORAL_TABLET | Freq: Every day | ORAL | 3 refills | Status: DC

## 2021-08-10 ENCOUNTER — Other Ambulatory Visit: Payer: Self-pay | Admitting: Internal Medicine

## 2021-08-12 ENCOUNTER — Telehealth: Payer: Self-pay

## 2021-08-12 ENCOUNTER — Encounter: Payer: Self-pay | Admitting: Internal Medicine

## 2021-08-12 NOTE — Telephone Encounter (Signed)
Patient called in with questions about a xray she got done and would like a call back. Patient states the xray results say something about a previous device is no longer there and she has internal bleeding and thinks it is from the PPM leads coming loose.  ?

## 2021-08-12 NOTE — Telephone Encounter (Signed)
Patient has questions about her most recent xray on 08/07/2021. Advised patient I would send a message to Dr. Elberta Fortis to see if he can check and give any feedback.  ?

## 2021-08-13 ENCOUNTER — Encounter (HOSPITAL_COMMUNITY): Payer: Self-pay | Admitting: Internal Medicine

## 2021-08-13 ENCOUNTER — Other Ambulatory Visit: Payer: Self-pay | Admitting: Internal Medicine

## 2021-08-13 DIAGNOSIS — E611 Iron deficiency: Secondary | ICD-10-CM

## 2021-08-13 MED ORDER — ACCRUFER 30 MG PO CAPS: 1.0000 | ORAL_CAPSULE | Freq: Two times a day (BID) | ORAL | 0 refills | Status: DC

## 2021-08-13 NOTE — Telephone Encounter (Signed)
Called patient to follow up per Dr. Curt Bears recommendations. Left patient message on VM in regards to xray.  ?

## 2021-08-14 ENCOUNTER — Telehealth: Payer: Self-pay

## 2021-08-14 NOTE — Telephone Encounter (Signed)
Key: [id number] ?Nexletol ?Approved ? ?Key: [id number] ?Febuxostat ?Approved ? ? ?

## 2021-08-20 ENCOUNTER — Other Ambulatory Visit (HOSPITAL_COMMUNITY): Payer: Self-pay

## 2021-08-20 ENCOUNTER — Telehealth (HOSPITAL_COMMUNITY): Payer: Self-pay | Admitting: Pharmacy Technician

## 2021-08-20 NOTE — Telephone Encounter (Signed)
Advanced Heart Failure Patient Advocate Encounter ? ?Patient left message stating that she is in the donut hole and needs help with Xarelto. Did a price comparison with Eliquis, and it is only $5 less. The patient would need to spend 4% OOP on medications before she would be considered for assistance through [employer].  ? ?Could potentially help with Jardiance to offset paying for Xarelto at this time. Called patient back and left her a message to return the call. ? ?Archer Asa, CPhT ? ?

## 2021-08-21 ENCOUNTER — Other Ambulatory Visit: Payer: Self-pay

## 2021-08-21 ENCOUNTER — Encounter (HOSPITAL_COMMUNITY): Payer: Self-pay | Admitting: Nurse Practitioner

## 2021-08-21 ENCOUNTER — Ambulatory Visit (HOSPITAL_COMMUNITY)
Admission: RE | Admit: 2021-08-21 | Discharge: 2021-08-21 | Disposition: A | Discharge status: Home / Self Care | Payer: Medicare Other | Source: Ambulatory Visit | Attending: Nurse Practitioner | Admitting: Nurse Practitioner

## 2021-08-21 ENCOUNTER — Other Ambulatory Visit (HOSPITAL_COMMUNITY): Payer: Self-pay

## 2021-08-21 VITALS — BP 114/56 | HR 74 | Ht 60.0 in | Wt 199.0 lb

## 2021-08-21 DIAGNOSIS — E669 Obesity, unspecified: Secondary | ICD-10-CM | POA: Diagnosis not present

## 2021-08-21 DIAGNOSIS — I4891 Unspecified atrial fibrillation: Secondary | ICD-10-CM | POA: Insufficient documentation

## 2021-08-21 DIAGNOSIS — J449 Chronic obstructive pulmonary disease, unspecified: Secondary | ICD-10-CM | POA: Insufficient documentation

## 2021-08-21 DIAGNOSIS — I442 Atrioventricular block, complete: Secondary | ICD-10-CM | POA: Insufficient documentation

## 2021-08-21 DIAGNOSIS — I4819 Other persistent atrial fibrillation: Secondary | ICD-10-CM | POA: Diagnosis not present

## 2021-08-21 DIAGNOSIS — I5022 Chronic systolic (congestive) heart failure: Secondary | ICD-10-CM | POA: Insufficient documentation

## 2021-08-21 DIAGNOSIS — I110 Hypertensive heart disease with heart failure: Secondary | ICD-10-CM | POA: Diagnosis not present

## 2021-08-21 DIAGNOSIS — Z950 Presence of cardiac pacemaker: Secondary | ICD-10-CM | POA: Insufficient documentation

## 2021-08-21 DIAGNOSIS — D6869 Other thrombophilia: Secondary | ICD-10-CM | POA: Diagnosis not present

## 2021-08-21 DIAGNOSIS — J8489 Other specified interstitial pulmonary diseases: Secondary | ICD-10-CM | POA: Diagnosis not present

## 2021-08-21 DIAGNOSIS — Z7901 Long term (current) use of anticoagulants: Secondary | ICD-10-CM | POA: Insufficient documentation

## 2021-08-21 LAB — MAGNESIUM: Magnesium: 2 mg/dL (ref 1.7–2.4)

## 2021-08-21 NOTE — Telephone Encounter (Signed)
Advanced Heart Failure Patient Advocate Encounter ? ?Spoke with patient. From the income she gave it sounds like she has already spent the 4% OOP required for Xarelto assistance through [employer]. She is going to work on getting POI and OOP to bring to her appointment on 04/26. Will get signature on application at that time.  ? ?Archer Asa, CPhT ? ?

## 2021-08-21 NOTE — Progress Notes (Signed)
? ?Primary Care Physician: [name], MD ?Referring Physician:  Dr. Rayann Heman  ?Prior EP: Dr. Rayann Heman ?AHF: Dr.Benshimon  ? ?Kathleen [name] is a 76 y.o. 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 ?Per Dr. Jackalyn Lombard note of 05/2021,  ? She has had a challenging CV history.  She says that she was diagnosed with atrial fibrillation in 2014.  She took multaq but developed a rash.  She was placed on tikosyn but continued to have afib.  She appears to have had PVI + CTI by Dr Westley Gambles 2/15.  She states that this took 9 hours and was not successful.  She continued to have atrial arrhythmias.  She underwent AV nodal ablation and pacemaker implantation 11/02/15.  LV lead could not be placed however she received a CRT-P device.  Per Dr Zettie Cooley recent note, it appears that the lead was not placed due to high LV lead thresholds.  She had return of her AV nodal conduction and required repeat AV nodal ablation by Dr Dwana Curd the following day.  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  She returned to EP lab and had the device placed submuscular Sept 2015.  ?Per Dr Samuella Bruin note (8/22), she had redo ablation 2019.  ? ? She was started on tikosyn 3/22 with improvement in her AF burden.The patient also had left bundle lead placed 05/09/2020. ? ?She was hospitalized  for sepsis due to extensive chest wall cellulitis 12/22 at [facility]. She no longer is followed at [facility] as she moved out of the area.  ? ?She is in the afib clinic for Tikosyn surveillance. She is a sensed/v paced. She has not had any awareness of afib. She continues on tikosyn 250 mcg bid. No issues with anticoagulation.  ?  ? She was placed on Tikosyn possibly last March 2022 at [facility]. She struggles with chronic lung disease. She is on CPAP and continuous O2 via  nasal cannula at 4 L /min ? ?Today, she denies symptoms of palpitations, chest pain, shortness of breath, orthopnea, PND, lower extremity edema, dizziness, presyncope, syncope, or neurologic sequela. The patient is tolerating medications without difficulties and is otherwise without complaint today.  ? ?Past Medical History:  ?Diagnosis Date  ? Asthma   ? Atypical atrial flutter ([place])   ? 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[place])   ? CHF (congestive heart failure) ([place])   ? Chronic renal insufficiency   ? 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 COPD (chronic obstructive pulmonary disease) ([place])   ? Gout   ? Hypertension   ? Longstanding persistent atrial fibrillation ([place])   ? Nonischemic cardiomyopathy ([place])   ? Obesity   ? Pacemaker   ? Spontaneous pneumothorax 2013  ? ?Past Surgical History:  ?Procedure Laterality Date  ? ABDOMINAL HYSTERECTOMY    ? ATRIAL FIBRILLATION ABLATION  07/20/2013  ? by Dr Westley Gambles  ? AV nodal ablation  11/01/2013  ? by Dr Westley Gambles, repeated by Dr Dwana Curd  ? BREAST BIOPSY Bilateral 1997  ? negative  ? CARDIAC CATHETERIZATION    ? CHOLECYSTECTOMY    ? HERNIA REPAIR    ? PACEMAKER INSERTION  06/2017  ? MDT Viva CRT-P implanted by Dr Westley Gambles after AV nodal ablation,  LV lead could not  be placed  ? PACEMAKER INSERTION  05/2020  ? with lead bundle  ? ? ?Current Outpatient Medications  ?Medication Sig Dispense Refill  ? acetaminophen (TYLENOL) 650 MG CR tablet Take 1,300 mg by mouth as needed for pain (take with budesonide).    ? albuterol (PROVENTIL HFA;VENTOLIN HFA) 108 (90 Base) MCG/ACT inhaler Inhale 2 puffs into the lungs every 4 (four) hours as needed for wheezing or shortness of breath.    ? azelastine (ASTELIN) 0.1 % nasal spray Place 2 sprays into both nostrils 2 (two) times daily.    ? Cholecalciferol (VITAMIN D3) 50 MCG (2000 UT) TABS Take 2,000 Units by mouth at bedtime.    ? cyanocobalamin (,VITAMIN B-12,) 1000 MCG/ML injection Inject 1 mL (1,000  mcg total) into the muscle every 30 (thirty) days. 10 mL 0  ? docusate sodium (COLACE) 100 MG capsule Take 100 mg by mouth 2 (two) times daily.    ? dofetilide (TIKOSYN) 250 MCG capsule Take 1 capsule (250 mcg total) by mouth in the morning and at bedtime. 60 capsule 2  ? ezetimibe (ZETIA) 10 MG tablet Take 10 mg by mouth daily.    ? febuxostat (ULORIC) 40 MG tablet TAKE 1 TABLET(40 MG) BY MOUTH EVERY NIGHT 90 tablet 1  ? fluconazole (DIFLUCAN) 150 MG tablet Take 1 tablet (150 mg total) by mouth once a week. 6 tablet 0  ? guaiFENesin (MUCINEX) 600 MG 12 hr tablet Take 600 mg by mouth as needed.    ? levothyroxine (SYNTHROID) 50 MCG tablet Take 50 mcg by mouth daily before breakfast.  2  ? linaclotide (LINZESS) 72 MCG capsule Take 72 mg by mouth as needed.    ? losartan (COZAAR) 25 MG tablet Take 1 tablet (25 mg total) by mouth at bedtime. 30 tablet 6  ? Magnesium 500 MG TABS Take 500 mg by mouth at bedtime.    ? OXYGEN Inhale 4 L into the lungs continuous. Use with resmed ventilator    ? rivaroxaban (XARELTO) 20 MG TABS tablet Take 1 tablet (20 mg total) by mouth daily with supper. 30 tablet 3  ? spironolactone (ALDACTONE) 25 MG tablet Take 1 tablet (25 mg total) by mouth every morning. 30 tablet 3  ? torsemide (DEMADEX) 20 MG tablet Take 20mg  (1 Tab) Monday and Friday 60 tablet 3  ? traMADol (ULTRAM) 50 MG tablet Take 50 mg by mouth as needed.    ? YUPELRI 175 MCG/3ML nebulizer solution USE 1 VIAL IN NEBULIZER DAILY - DO NOT MIX WITH OTHER NEB MEDS,USE BEFORE OR AFTER 30 mL 11  ? Bempedoic Acid (NEXLETOL) 180 MG TABS Take 1 tablet by mouth daily. (Patient not taking: Reported on 08/21/2021) 90 tablet 1  ? ?No current facility-administered medications for this encounter.  ? ? ?Allergies  ?Allergen Reactions  ? Allopurinol Other (See Comments)  ?  Reaction:  Dizziness   ? Clindamycin Anaphylaxis and Hives  ? Pneumococcal 13-Val Conj Vacc Itching, Swelling and Rash  ? Dronedarone Rash  ? Brovana [Arformoterol]   ?   Caused muscle pain  ? Budesonide   ?  Caused extreme joint pain  ? Entresto [Sacubitril-Valsartan] Other (See Comments)  ?  hypotension  ? Fosamax [Alendronate Sodium] Nausea Only  ? Jardiance [Empagliflozin]   ?  Caused a vaginal infection  ? Meperidine Nausea And Vomiting  ? Rosuvastatin Other (See Comments)  ?  Reaction:  Muscle spasms   ? Tetracycline Hives  ? Adhesive [Tape] Rash  ? Lovastatin Rash  ?  Other reaction(s): Muscle Pain  ? ? ?Social History  ? ?Socioeconomic History  ? Marital status: Widowed  ?  Spouse name: Not on file  ? Number of children: Not on file  ? Years of education: Not on file  ? Highest education level: Not on file  ?Occupational History  ? Occupation: retired  ?Tobacco Use  ? Smoking status: Former  ?  Types: Cigarettes  ?  Quit date: 06/09/1987  ?  Years since quitting: 34.2  ? Smokeless tobacco: Never  ?Vaping Use  ? Vaping Use: Never used  ?Substance and Sexual Activity  ? Alcohol use: No  ?  Alcohol/week: 0.0 standard drinks  ? Drug use: No  ? Sexual activity: Not Currently  ?Other Topics Concern  ? Not on file  ?Social History Narrative  ? Pt lives in [place] alone.  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  Her son works in [employer].  ? ?Social Determinants of Health  ? ?Financial Resource Strain: Not on file  ?Food Insecurity: Not on file  ?Transportation Needs: Not on file  ?Physical Activity: Not on file  ?Stress: Not on file  ?Social Connections: Not on file  ?Intimate Partner Violence: Not on file  ? ? ?Family History  ?Problem Relation Age of Onset  ? Breast cancer Mother 88  ?     3 different times  ? Pancreatic cancer Mother   ? Breast cancer Cousin   ? Colon cancer Neg Hx   ? Esophageal cancer Neg Hx   ? Stomach cancer Neg Hx   ? Inflammatory bowel disease Neg Hx   ? Liver disease Neg Hx   ? Rectal cancer Neg Hx   ? ? ?ROS- All systems are reviewed and negative except as per the HPI above ? ?Physical Exam: ?Vitals:  ? 08/21/21 1118  ?BP: (!) 114/56  ?Pulse:  74  ?Weight: 90.3 kg  ?Height: 5' (1.524 m)  ? ?Wt Readings from Last 3 Encounters:  ?08/21/21 90.3 kg  ?08/07/21 90.3 kg  ?07/04/21 94 kg  ? ? ?Labs: ?Lab Results  ?Component Value Date  ? NA 137 02/22/

## 2021-09-01 ENCOUNTER — Telehealth: Payer: Self-pay | Admitting: Internal Medicine

## 2021-09-01 NOTE — Telephone Encounter (Signed)
Type of form received- Handicapped Placard  ? ?Form placed in- Provider mailbox ? ?Additional instructions from the patient- Notify by phone when completed Corene Cornea)  ? ?Things to remember: ?[place] office: If form received in person, remind patient that forms take 7-10 business days ?CMA should attach charge sheet and put on Supervisor's desk  ?

## 2021-09-02 NOTE — Telephone Encounter (Signed)
Forms have been completed and given to PCP for signature.  °

## 2021-09-02 NOTE — Telephone Encounter (Signed)
Pt's son, [name], has been informed that forms are completed and ready for pick up at the front desk. ?

## 2021-09-03 ENCOUNTER — Emergency Department (HOSPITAL_BASED_OUTPATIENT_CLINIC_OR_DEPARTMENT_OTHER)
Admission: EM | Admit: 2021-09-03 | Discharge: 2021-09-03 | Disposition: A | Discharge status: Home / Self Care | Payer: Medicare Other | Attending: Emergency Medicine | Admitting: Emergency Medicine

## 2021-09-03 ENCOUNTER — Encounter (HOSPITAL_BASED_OUTPATIENT_CLINIC_OR_DEPARTMENT_OTHER): Payer: Self-pay

## 2021-09-03 ENCOUNTER — Other Ambulatory Visit: Payer: Self-pay

## 2021-09-03 ENCOUNTER — Emergency Department (HOSPITAL_BASED_OUTPATIENT_CLINIC_OR_DEPARTMENT_OTHER): Payer: Medicare Other

## 2021-09-03 ENCOUNTER — Emergency Department (HOSPITAL_BASED_OUTPATIENT_CLINIC_OR_DEPARTMENT_OTHER): Payer: Medicare Other | Admitting: Radiology

## 2021-09-03 DIAGNOSIS — Z9981 Dependence on supplemental oxygen: Secondary | ICD-10-CM | POA: Diagnosis not present

## 2021-09-03 DIAGNOSIS — Z7901 Long term (current) use of anticoagulants: Secondary | ICD-10-CM | POA: Insufficient documentation

## 2021-09-03 DIAGNOSIS — B379 Candidiasis, unspecified: Secondary | ICD-10-CM | POA: Diagnosis not present

## 2021-09-03 DIAGNOSIS — Z950 Presence of cardiac pacemaker: Secondary | ICD-10-CM | POA: Diagnosis not present

## 2021-09-03 DIAGNOSIS — I110 Hypertensive heart disease with heart failure: Secondary | ICD-10-CM | POA: Diagnosis not present

## 2021-09-03 DIAGNOSIS — S299XXA Unspecified injury of thorax, initial encounter: Secondary | ICD-10-CM | POA: Diagnosis present

## 2021-09-03 DIAGNOSIS — X58XXXA Exposure to other specified factors, initial encounter: Secondary | ICD-10-CM | POA: Insufficient documentation

## 2021-09-03 DIAGNOSIS — I2510 Atherosclerotic heart disease of native coronary artery without angina pectoris: Secondary | ICD-10-CM | POA: Diagnosis not present

## 2021-09-03 DIAGNOSIS — I509 Heart failure, unspecified: Secondary | ICD-10-CM | POA: Diagnosis not present

## 2021-09-03 DIAGNOSIS — S20312A Abrasion of left front wall of thorax, initial encounter: Secondary | ICD-10-CM | POA: Insufficient documentation

## 2021-09-03 DIAGNOSIS — J411 Mucopurulent chronic bronchitis: Secondary | ICD-10-CM | POA: Diagnosis not present

## 2021-09-03 DIAGNOSIS — B372 Candidiasis of skin and nail: Secondary | ICD-10-CM

## 2021-09-03 DIAGNOSIS — I4819 Other persistent atrial fibrillation: Secondary | ICD-10-CM | POA: Insufficient documentation

## 2021-09-03 DIAGNOSIS — T148XXA Other injury of unspecified body region, initial encounter: Secondary | ICD-10-CM

## 2021-09-03 LAB — BRAIN NATRIURETIC PEPTIDE: B Natriuretic Peptide: 65.8 pg/mL (ref 0.0–100.0)

## 2021-09-03 LAB — CBC WITH DIFFERENTIAL/PLATELET
Abs Immature Granulocytes: 0.03 10*3/uL (ref 0.00–0.07)
Basophils Absolute: 0.1 10*3/uL (ref 0.0–0.1)
Basophils Relative: 1 %
Eosinophils Absolute: 0.1 10*3/uL (ref 0.0–0.5)
Eosinophils Relative: 1 %
HCT: 39.2 % (ref 36.0–46.0)
Hemoglobin: 12.4 g/dL (ref 12.0–15.0)
Immature Granulocytes: 0 %
Lymphocytes Relative: 10 %
Lymphs Abs: 0.8 10*3/uL (ref 0.7–4.0)
MCH: 25.7 pg — ABNORMAL LOW (ref 26.0–34.0)
MCHC: 31.6 g/dL (ref 30.0–36.0)
MCV: 81.2 fL (ref 80.0–100.0)
Monocytes Absolute: 0.6 10*3/uL (ref 0.1–1.0)
Monocytes Relative: 7 %
Neutro Abs: 6.7 10*3/uL (ref 1.7–7.7)
Neutrophils Relative %: 81 %
Platelets: 224 10*3/uL (ref 150–400)
RBC: 4.83 MIL/uL (ref 3.87–5.11)
RDW: 14.4 % (ref 11.5–15.5)
WBC: 8.3 10*3/uL (ref 4.0–10.5)
nRBC: 0 % (ref 0.0–0.2)

## 2021-09-03 LAB — BASIC METABOLIC PANEL
Anion gap: 9 (ref 5–15)
BUN: 15 mg/dL (ref 8–23)
CO2: 27 mmol/L (ref 22–32)
Calcium: 10.3 mg/dL (ref 8.9–10.3)
Chloride: 104 mmol/L (ref 98–111)
Creatinine, Ser: 0.67 mg/dL (ref 0.44–1.00)
GFR, Estimated: >60 mL/min (ref >60)
Glucose, Bld: 109 mg/dL — ABNORMAL HIGH (ref 70–99)
Potassium: 4.1 mmol/L (ref 3.5–5.1)
Sodium: 140 mmol/L (ref 135–145)

## 2021-09-03 MED ORDER — PREDNISONE 10 MG PO TABS: 40.0000 mg | ORAL_TABLET | Freq: Every day | ORAL | 0 refills | Status: AC

## 2021-09-03 MED ORDER — MUPIROCIN 2 % EX OINT
TOPICAL_OINTMENT | Freq: Once | CUTANEOUS | Status: AC
  Filled 2021-09-03: qty 22

## 2021-09-03 MED ORDER — CEFUROXIME AXETIL 500 MG PO TABS
500.0000 mg | ORAL_TABLET | Freq: Two times a day (BID) | ORAL | 0 refills | Status: AC
Start: 2021-09-03 — End: 2021-09-08

## 2021-09-03 MED ORDER — ZINC OXIDE 40 % EX OINT: 1.0000 "application " | TOPICAL_OINTMENT | CUTANEOUS | 0 refills | Status: DC | PRN

## 2021-09-03 MED ORDER — MUPIROCIN CALCIUM 2 % EX CREA: TOPICAL_CREAM | Freq: Once | CUTANEOUS | Status: DC

## 2021-09-03 MED ORDER — NYSTATIN 100000 UNIT/GM EX CREA: TOPICAL_CREAM | CUTANEOUS | 0 refills | Status: DC

## 2021-09-03 MED ORDER — FLUCONAZOLE 150 MG PO TABS: 150.0000 mg | ORAL_TABLET | Freq: Every day | ORAL | 0 refills | Status: DC

## 2021-09-03 MED ORDER — FLUCONAZOLE 150 MG PO TABS
150.0000 mg | ORAL_TABLET | Freq: Once | ORAL | Status: AC
  Administered 2021-09-03: 150 mg via ORAL
  Filled 2021-09-03: qty 1

## 2021-09-03 MED ORDER — SULFAMETHOXAZOLE-TRIMETHOPRIM 800-160 MG PO TABS: 1.0000 | ORAL_TABLET | Freq: Two times a day (BID) | ORAL | 0 refills | Status: DC

## 2021-09-03 MED ORDER — MUPIROCIN CALCIUM 2 % EX CREA: 1.0000 "application " | TOPICAL_CREAM | Freq: Two times a day (BID) | CUTANEOUS | 0 refills | Status: DC

## 2021-09-03 MED ORDER — NYSTATIN 100000 UNIT/GM EX POWD
Freq: Once | CUTANEOUS | Status: AC
Start: 2021-09-03 — End: 2021-09-03
  Filled 2021-09-03: qty 15

## 2021-09-03 NOTE — ED Notes (Signed)
Patient transported to X-ray 

## 2021-09-03 NOTE — ED Provider Notes (Signed)
?[facility] EMERGENCY DEPT ?Provider Note ? ? ?CSN: [id number] ?Arrival date & time: 09/03/21  8469 ? ?  ? ?History ? ?Chief Complaint  ?Patient presents with  ? URI  ? Rash  ? ? ?Kathleen [name] is a 76 y.o. female. ? ? ?URI ? ? 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  ? ?She states that she has a hx of left breast cellulitis in December and as admitted. Was originally a yeast infection, then bacterial infection, progressed to sepsis requiring admission.  ? ?Since discharge, she has had a persistent rash in that area. Had been on Nystatin powder previously but her PCP stopped the medication. She has been trying Triamcinolone cream after being taken off of Nystain powder. She endorses worsening pain and swelling under the left breast. ? ?Was treated with a 7d course of Omnicef on 08/07/21 for a lower respiratory infection and concern for developing PNA.  ? ?Home Medications ?Prior to Admission medications   ?Medication Sig Start Date End Date Taking? Authorizing Provider  ?cefUROXime (CEFTIN) 500 MG tablet Take 1 tablet (500 mg total) by mouth 2 (two) times daily with a meal for 5 days. 09/03/21 09/08/21 Yes Ernie Avena, MD  ?fluconazole (DIFLUCAN) 150 MG tablet Take 1 tablet (150 mg total) by mouth daily. 09/06/21  Yes Ernie Avena, MD  ?liver oil-zinc oxide (DESITIN) 40 % ointment Apply 1 application. topically as needed for irritation. 09/03/21  Yes Ernie Avena, MD  ?mupirocin cream (BACTROBAN) 2 % Apply 1 application. topically 2 (two) times daily. 09/03/21  Yes Ernie Avena, MD  ?nystatin cream (MYCOSTATIN) Apply to affected area 2 times daily 09/03/21  Yes Ernie Avena, MD  ?predniSONE (DELTASONE) 10 MG tablet Take 4 tablets (40 mg total) by mouth  daily for 5 days. 09/03/21 09/08/21 Yes Ernie Avena, MD  ?acetaminophen (TYLENOL) 650 MG CR tablet Take 1,300 mg by mouth as needed for pain (take with budesonide).    [provider]  ?albuterol (PROVENTIL HFA;VENTOLIN HFA) 108 (90 Base) MCG/ACT inhaler Inhale 2 puffs into the lungs every 4 (four) hours as needed for wheezing or shortness of breath.    [provider]  ?azelastine (ASTELIN) 0.1 % nasal spray Place 2 sprays into both nostrils 2 (two) times daily. 09/06/20   [provider]  ?Bempedoic Acid (NEXLETOL) 180 MG TABS Take 1 tablet by mouth daily. ?Patient not taking: Reported on 08/21/2021 08/08/21   [name], MD  ?Cholecalciferol (VITAMIN D3) 50 MCG (2000 UT) TABS Take 2,000 Units by mouth at bedtime.    [provider]  ?cyanocobalamin (,VITAMIN B-12,) 1000 MCG/ML injection Inject 1 mL (1,000 mcg total) into the muscle every 30 (thirty) days. 04/01/21   [name], MD  ?docusate sodium (COLACE) 100 MG capsule Take 100 mg by mouth 2 (two) times daily.    [provider]  ?dofetilide (TIKOSYN) 250 MCG capsule Take 1 capsule (250 mcg total) by mouth in the morning and at bedtime. 07/08/21 07/08/22  Allred, Fayrene Fearing, MD  ?ezetimibe (ZETIA) 10 MG tablet Take 10 mg by mouth daily. 08/07/21   [provider]  ?febuxostat (ULORIC) 40 MG tablet TAKE 1 TABLET(40 MG) BY MOUTH EVERY NIGHT 08/10/21   [name], MD  ?guaiFENesin [facility]) 600  MG 12 hr tablet Take 600 mg by mouth as needed.    [provider]  ?levothyroxine (SYNTHROID) 50 MCG tablet Take 50 mcg by mouth daily before breakfast.    [provider]  ?linaclotide (LINZESS) 72 MCG capsule Take 72 mg by mouth as needed.    [provider]  ?losartan (COZAAR) 25 MG tablet Take 1 tablet (25 mg total) by mouth at bedtime. 02/13/21   Bensimhon, [name], MD  ?Magnesium 500 MG TABS Take 500 mg by mouth at bedtime.    [provider]  ?OXYGEN Inhale 4 L into the lungs  continuous. Use with resmed ventilator    [provider]  ?rivaroxaban (XARELTO) 20 MG TABS tablet Take 1 tablet (20 mg total) by mouth daily with supper. 08/08/21   Bensimhon, [name], MD  ?spironolactone (ALDACTONE) 25 MG tablet Take 1 tablet (25 mg total) by mouth every morning. 06/10/21   Bensimhon, [name], MD  ?torsemide (DEMADEX) 20 MG tablet Take 20mg  (1 Tab) Monday and Friday 06/24/21   Bensimhon, [name], MD  ?traMADol (ULTRAM) 50 MG tablet Take 50 mg by mouth as needed. 05/23/21   [provider]  ?Mikael Spray 175 MCG/3ML nebulizer solution USE 1 VIAL IN NEBULIZER DAILY - DO NOT MIX WITH OTHER NEB MEDS,USE BEFORE OR AFTER 04/02/21   Martina Sinner, MD  ?   ? ?Allergies    ?Allopurinol, Clindamycin, Pneumococcal 13-val conj vacc, Dronedarone, Brovana [arformoterol], Budesonide, Entresto [sacubitril-valsartan], Fosamax [alendronate sodium], Jardiance [empagliflozin], Meperidine, Rosuvastatin, Tetracycline, Adhesive [tape], and Lovastatin   ? ?Review of Systems   ?Review of Systems  ?All other systems reviewed and are negative. ? ?Physical Exam ?Updated Vital Signs ?BP 135/70   Pulse 70   Temp 97.8 ?F (36.6 ?C)   Resp 20   SpO2 100%  ?Physical Exam ?Vitals and nursing note reviewed.  ?Constitutional:   ?   General: She is not in acute distress. ?   Appearance: She is well-developed. She is obese.  ?HENT:  ?   Head: Normocephalic and atraumatic.  ?Eyes:  ?   Conjunctiva/sclera: Conjunctivae normal.  ?   Pupils: Pupils are equal, round, and reactive to light.  ?Cardiovascular:  ?   Rate and Rhythm: Normal rate and regular rhythm.  ?   Heart sounds: No murmur heard. ?Pulmonary:  ?   Effort: Pulmonary effort is normal. No respiratory distress.  ?   Breath sounds: Wheezing present.  ?Chest:  ?   Comments: Clearly de-marked area of erythema and skin breakdown along the skin fold along the left breast and left chest wall ?Abdominal:  ?   General: There is no distension.  ?   Palpations: Abdomen  is soft.  ?   Tenderness: There is no abdominal tenderness. There is no guarding.  ?Musculoskeletal:     ?   General: No swelling, deformity or signs of injury.  ?   Cervical back: Neck supple.  ?Skin: ?   General: Skin is warm and dry.  ?   Capillary Refill: Capillary refill takes less than 2 seconds.  ?   Findings: Rash present.  ?   Comments: Area of erythema along the patient's groin, beefy red in appearance  ?Neurological:  ?   General: No focal deficit present.  ?   Mental Status: She is alert. Mental status is at baseline.  ?Psychiatric:     ?   Mood and Affect: Mood normal.  ? ? ?ED Results / Procedures /  Treatments   ?Labs ?(all labs ordered are listed, but only abnormal results are displayed) ?Labs Reviewed  ?CBC WITH DIFFERENTIAL/PLATELET - Abnormal; Notable for the following components:  ?    Result Value  ? MCH 25.7 (*)   ? All other components within normal limits  ?BASIC METABOLIC PANEL - Abnormal; Notable for the following components:  ? Glucose, Bld 109 (*)   ? All other components within normal limits  ?BRAIN NATRIURETIC PEPTIDE  ? ? ?EKG ?EKG Interpretation ? ?Date/Time:  Wednesday September 03 2021 10:50:08 EDT ?Ventricular Rate:  70 ?PR Interval:  185 ?QRS Duration: 141 ?QT Interval:  441 ?QTC Calculation: 476 ?R Axis:   -65 ?Text Interpretation: Atrial-sensed ventricular-paced rhythm Left bundle branch block No significant change since last tracing Confirmed by Ernie Avena ([id number]) on 09/03/2021 11:03:59 AM ? ?Radiology ?No results found. ? ?Procedures ?Procedures  ? ? ?Medications Ordered in ED ?Medications  ?fluconazole (DIFLUCAN) tablet 150 mg (150 mg Oral Given 09/03/21 1120)  ?nystatin (MYCOSTATIN/NYSTOP) topical powder ( Topical Given 09/03/21 1118)  ?mupirocin ointment (BACTROBAN) 2 % ( Topical Given 09/03/21 1118)  ? ? ?ED Course/ Medical Decision Making/ A&P ?  ?                        ?Medical Decision Making ?Amount and/or Complexity of Data Reviewed ?Labs: ordered. ?Radiology:  ordered. ? ?Risk ?OTC drugs. ?Prescription drug management. ? ? 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

## 2021-09-03 NOTE — Discharge Instructions (Addendum)
You were evaluated in the Emergency Department and after careful evaluation, we did not find any emergent condition requiring admission or further testing in the hospital. ? ?Your exam/testing today was overall reassuring.  Your laboratory work-up was reassuring.  You had no elevation of white count to suggest bacterial pneumonia.  Your CT imaging was also reassuring pointing away from bacterial pneumonia.  Your symptoms of productive cough are likely due to your chronic COPD.  Your home nebulizer.  We will place you on a short prednisone burst in addition to antibiotic.  You have a yeast infection of your skin and lower abdomen.  This is causing skin breakdown which is developing into a wound.  Recommend aggressive wound care measures to include Desitin ointment, Bactroban, nystatin cream in addition to dressings daily.  Recommend you take an additional Diflucan tablet in 3 days time to treat a yeast infection of your skin. ? ?Please return to the Emergency Department if you experience any worsening of your condition.  Thank you for allowing [place] to be a part of your care. ? ?

## 2021-09-03 NOTE — ED Notes (Signed)
Dressing applied to area under left breast with bacitracin ointment and nystatin power ?

## 2021-09-03 NOTE — ED Triage Notes (Signed)
This pt., who is on chronic O2 tells me that she has a rash under left breast. She denies fever, and is in no distress. She c/o chronic cough (hx of tracheobronciomalacia. ?

## 2021-09-03 NOTE — ED Notes (Signed)
RT Note: Pt. arrived on own home portable Oxygen device/[place], placed on 3.5 lpm n/c while in Triage, this RT familiar with pt. and was seen here at [facility] in the past, currently here to be seen for ? Cellulitis-L chest, RT assessment done, RT to monitor. ?

## 2021-09-08 ENCOUNTER — Other Ambulatory Visit: Payer: Self-pay | Admitting: Internal Medicine

## 2021-09-08 ENCOUNTER — Telehealth: Payer: Self-pay | Admitting: Internal Medicine

## 2021-09-08 DIAGNOSIS — N61 Mastitis without abscess: Secondary | ICD-10-CM

## 2021-09-08 DIAGNOSIS — B372 Candidiasis of skin and nail: Secondary | ICD-10-CM

## 2021-09-08 MED ORDER — MUPIROCIN CALCIUM 2 % EX CREA: 1.0000 "application " | TOPICAL_CREAM | Freq: Two times a day (BID) | CUTANEOUS | 2 refills | Status: DC

## 2021-09-08 MED ORDER — NYSTATIN 100000 UNIT/GM EX CREA: TOPICAL_CREAM | CUTANEOUS | 1 refills | Status: DC

## 2021-09-08 NOTE — Telephone Encounter (Signed)
Pt states she was prescribed ?mupirocin cream (BACTROBAN) 2 % ? ?nystatin cream (MYCOSTATIN)  at the ed on 3-29 for cellulitis ? ?Pt states she is running out of cream and inquiring if provider can send in another rx ? ?Pharmacy [facility] [phone] - [place], [place] - [address] AT [facility] OF [address] ?

## 2021-09-09 ENCOUNTER — Other Ambulatory Visit: Payer: Self-pay | Admitting: Internal Medicine

## 2021-09-09 DIAGNOSIS — N61 Mastitis without abscess: Secondary | ICD-10-CM

## 2021-09-09 MED ORDER — MUPIROCIN 2 % EX OINT: 1.0000 "application " | TOPICAL_OINTMENT | Freq: Two times a day (BID) | CUTANEOUS | 2 refills | Status: DC

## 2021-09-09 NOTE — Telephone Encounter (Signed)
Pt called to get an update on message.  ? ?States that she has been out for two days now. Please send over ointment.  ?

## 2021-09-09 NOTE — Telephone Encounter (Signed)
Pt stated that the pharmacy can not get the mupirocin in a cream as Rx'd on 4/3. However, then can get it in an ointment but they would need the Rx changed to reflect that. Please advise.  ?

## 2021-09-24 ENCOUNTER — Telehealth: Payer: Self-pay | Admitting: Pulmonary Disease

## 2021-09-24 NOTE — Telephone Encounter (Signed)
Pt called, and request to change providers. Patient reasoning for wanting to switch providers were "they don't get along they don't see eye to eye " and she was told by Jill Alexanders, at  [facility] that Dr Vassie Loll, was good, so she would like to establish care with Dr Vassie Loll. Pt states that she understands policy after she was told. Patient sates she would like someone to call her to discuss furthermore. Her contact number is [phone] routing to triage.  ?

## 2021-09-25 NOTE — Telephone Encounter (Signed)
Kathleen Milch, MD ?to [facility] Triage Parks Ranger, MD   ?   3:31 PM ?I may not have any openings through t he summer so suggest she stay with current provider or find other provider in our clinic  ? ? ? ?Called and spoke with pt letting her know the info from Dr. Vassie Loll and went ahead and looked to see when next avail for pt to see Dr. Vassie Loll would be. Next avail is 6/1 which pt was okay with that so new pt appt has been scheduled for pt with Dr. Vassie Loll. Dr. Francine Graven was also made aware of pt's reason for the provider change. Nothing further needed. ?

## 2021-09-25 NOTE — Telephone Encounter (Signed)
This patient wants to know if she can switch to Dr. Vassie Loll from Dr. Francine Graven. Please advise if ok to change. ? ?

## 2021-10-01 ENCOUNTER — Ambulatory Visit (INDEPENDENT_AMBULATORY_CARE_PROVIDER_SITE_OTHER): Payer: Medicare Other

## 2021-10-01 ENCOUNTER — Other Ambulatory Visit (HOSPITAL_COMMUNITY): Payer: Self-pay | Admitting: Internal Medicine

## 2021-10-01 ENCOUNTER — Other Ambulatory Visit (HOSPITAL_COMMUNITY): Payer: Self-pay

## 2021-10-01 ENCOUNTER — Encounter (HOSPITAL_COMMUNITY): Payer: Self-pay | Admitting: Internal Medicine

## 2021-10-01 ENCOUNTER — Ambulatory Visit (HOSPITAL_COMMUNITY)
Admission: RE | Admit: 2021-10-01 | Discharge: 2021-10-01 | Disposition: A | Discharge status: Home / Self Care | Payer: Medicare Other | Source: Ambulatory Visit | Attending: Internal Medicine | Admitting: Internal Medicine

## 2021-10-01 VITALS — BP 120/78 | HR 72 | Wt 194.8 lb

## 2021-10-01 DIAGNOSIS — M109 Gout, unspecified: Secondary | ICD-10-CM | POA: Insufficient documentation

## 2021-10-01 DIAGNOSIS — L233 Allergic contact dermatitis due to drugs in contact with skin: Secondary | ICD-10-CM

## 2021-10-01 DIAGNOSIS — Z7901 Long term (current) use of anticoagulants: Secondary | ICD-10-CM | POA: Diagnosis not present

## 2021-10-01 DIAGNOSIS — Z79899 Other long term (current) drug therapy: Secondary | ICD-10-CM | POA: Diagnosis not present

## 2021-10-01 DIAGNOSIS — Z9981 Dependence on supplemental oxygen: Secondary | ICD-10-CM | POA: Insufficient documentation

## 2021-10-01 DIAGNOSIS — I5022 Chronic systolic (congestive) heart failure: Secondary | ICD-10-CM

## 2021-10-01 DIAGNOSIS — J8489 Other specified interstitial pulmonary diseases: Secondary | ICD-10-CM | POA: Diagnosis not present

## 2021-10-01 DIAGNOSIS — I130 Hypertensive heart and chronic kidney disease with heart failure and stage 1 through stage 4 chronic kidney disease, or unspecified chronic kidney disease: Secondary | ICD-10-CM | POA: Diagnosis not present

## 2021-10-01 DIAGNOSIS — N189 Chronic kidney disease, unspecified: Secondary | ICD-10-CM | POA: Insufficient documentation

## 2021-10-01 DIAGNOSIS — Z9989 Dependence on other enabling machines and devices: Secondary | ICD-10-CM | POA: Insufficient documentation

## 2021-10-01 DIAGNOSIS — J9611 Chronic respiratory failure with hypoxia: Secondary | ICD-10-CM | POA: Insufficient documentation

## 2021-10-01 DIAGNOSIS — Z6838 Body mass index (BMI) 38.0-38.9, adult: Secondary | ICD-10-CM | POA: Insufficient documentation

## 2021-10-01 DIAGNOSIS — I4819 Other persistent atrial fibrillation: Secondary | ICD-10-CM | POA: Diagnosis not present

## 2021-10-01 DIAGNOSIS — I5032 Chronic diastolic (congestive) heart failure: Secondary | ICD-10-CM

## 2021-10-01 DIAGNOSIS — G4733 Obstructive sleep apnea (adult) (pediatric): Secondary | ICD-10-CM | POA: Diagnosis not present

## 2021-10-01 DIAGNOSIS — I4811 Longstanding persistent atrial fibrillation: Secondary | ICD-10-CM | POA: Diagnosis not present

## 2021-10-01 NOTE — Patient Instructions (Signed)
There has been no changes to your medications. ? ?You have been referred to Rheumatology. They will call you to arrange your appointment. ? ?Your physician has requested that you have an echocardiogram. Echocardiography is a painless test that uses sound waves to create images of your heart. It provides your doctor with information about the size and shape of your heart and how well your heart?s chambers and valves are working. This procedure takes approximately one hour. There are no restrictions for this procedure. ? ?Your physician recommends that you schedule a follow-up appointment in: 9 months with an Echocardiogram (December 2023)  **please call the office in September to arrange your follow up appointment ** ? ?If you have any questions or concerns before your next appointment please send [place] a message through [place] or call our office at [phone].   ? ?TO LEAVE A MESSAGE FOR THE NURSE SELECT OPTION 2, PLEASE LEAVE A MESSAGE INCLUDING: ?YOUR NAME ?DATE OF BIRTH ?CALL BACK NUMBER ?REASON FOR CALL**this is important as we prioritize the call backs ? ?YOU WILL RECEIVE A CALL BACK THE SAME DAY AS LONG AS YOU CALL BEFORE 4:00 PM ? ?At the [facility], you and your health needs are our priority. As part of our continuing mission to provide you with exceptional heart care, we have created designated Provider Care Teams. These Care Teams include your primary Cardiologist (physician) and Advanced Practice Providers (APPs- Physician Assistants and Nurse Practitioners) who all work together to provide you with the care you need, when you need it.  ? ?You may see any of the following providers on your designated Care Team at your next follow up: ?Dr Arvilla Meres ?Dr Marca Ancona ?Tonye Becket, NP ?Robbie Lis, PA ?Jessica Milford,NP ?Anna Genre, PA ?Karle Plumber, PharmD ? ? ?Please be sure to bring in all your medications bottles to every appointment.  ? ? ? ?

## 2021-10-01 NOTE — Progress Notes (Signed)
? ?ADVANCED HF CLINIC NOTE ? ? ?Primary Care: [name], MD ?Primary Cardiologist: Dr. Jeralyn Ruths ([facility]) ?HF Cardiologist: Dr. Gala Romney ? ?HPI: ?Ms. Rotella is a 76 y.o. 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 Recently moved from [place] to [place] and referred by Dr. Jeralyn Ruths to establish cardiology care.  ? ?Previous echo with EF 40% but EF recovered. Echo ([facility]) 8/22 EF > 55% RV normal  ? ?She has been followed by Dr. Wilford Grist and Jeralyn Ruths at [facility]. Has maintained NSR on Tikosyn.  ? ?RHC + LHC (01/17/20): RA 12, PA mean 29, PCWP 13, CI 2.2, Coronary angiogram without any stenosis  RHC (02/08/20): RA 8, PAM 24, wedge 16, CI 2.5 ? ?Now followed by Dr. Francine Graven in Pulmonary for her BOOP.   ? ?Dr. Gala Romney saw her for the fist time in 9/22. ? ?Zio 11/22: Predominantly AF with v-pacing with periods of sinus rhythm with V-pacing - avg HR of 75 bpm. 2. 2. Rare PACs and PVCs.  ? ?Follow up 1/23 stable NYHA III, volume OK on torsemide 2x/week and PRN. ? ?Seen in AF clinic 3/23, continued on dofetilide 250 bid. ? ?Today she returns for HF follow up with son. Overall feeling OK. Breathing is not great over the last few days, needing more O2 during the day. Chronically on 4L oxygen. Off She will follow up with Dr. Vassie Loll soon. Gets around with rollator, drinking a lot of fluid. Experiencing palpitations recently x couple months. Denies CP, edema, or PND/Orthopnea. Appetite ok. No fever or chills. Taking all medications. Lives in [facility]. Has had 5 gout attacks since 12/22, allergic to colchicine and allopurinol.  ? ?Pacer interrogation today < 0.1% AF No VT Personally reviewed ? ?Past Medical History:  ?Diagnosis Date  ? Asthma   ? Atypical atrial flutter (HCC)   ? 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HCC)   ? CHF (congestive heart failure) (HCC)   ? Chronic  renal insufficiency   ? 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 COPD (chronic obstructive pulmonary disease) (HCC)   ? Gout   ? Hypertension   ? Longstanding persistent atrial fibrillation (HCC)   ? Nonischemic cardiomyopathy (HCC)   ? Obesity   ? Pacemaker   ? Spontaneous pneumothorax 2013  ? ?Current Outpatient Medications  ?Medication Sig Dispense Refill  ? acetaminophen (TYLENOL) 650 MG CR tablet Take 1,300 mg by mouth as needed for pain (take with budesonide).    ? albuterol (PROVENTIL HFA;VENTOLIN HFA) 108 (90 Base) MCG/ACT inhaler Inhale 2 puffs into the lungs every 4 (four) hours as needed for wheezing or shortness of breath.    ? azelastine (ASTELIN) 0.1 % nasal spray Place 2 sprays into both nostrils 2 (two) times daily.    ? Bempedoic Acid (NEXLETOL) 180 MG TABS Take 1 tablet by mouth daily. 90 tablet 1  ? Cholecalciferol (VITAMIN D3) 50 MCG (2000 UT) TABS Take 2,000 Units by mouth at bedtime.    ? cyanocobalamin (,VITAMIN B-12,) 1000 MCG/ML injection Inject 1 mL (1,000 mcg total) into the muscle every 30 (thirty) days. 10 mL 0  ? docusate sodium (COLACE) 100 MG capsule Take 100 mg by mouth 2 (two) times daily.    ? dofetilide (TIKOSYN) 250 MCG capsule Take 1 capsule (250 mcg total) by mouth in the morning and at bedtime. 60 capsule 2  ?  ezetimibe (ZETIA) 10 MG tablet Take 10 mg by mouth daily.    ? febuxostat (ULORIC) 40 MG tablet TAKE 1 TABLET(40 MG) BY MOUTH EVERY NIGHT 90 tablet 1  ? guaiFENesin (MUCINEX) 600 MG 12 hr tablet Take 600 mg by mouth as needed.    ? levothyroxine (SYNTHROID) 50 MCG tablet Take 50 mcg by mouth daily before breakfast.  2  ? liver oil-zinc oxide (DESITIN) 40 % ointment Apply 1 application. topically as needed for irritation. 56.7 g 0  ? losartan (COZAAR) 25 MG tablet TAKE 1 TABLET(25 MG) BY MOUTH AT BEDTIME 90 tablet 3  ? Magnesium 500 MG TABS Take 500 mg by mouth at bedtime.    ? mupirocin ointment (BACTROBAN) 2 % Apply 1 application. topically 2 (two) times  daily. 30 g 2  ? nystatin cream (MYCOSTATIN) Apply to affected area 2 times daily 90 g 1  ? OXYGEN Inhale 4 L into the lungs continuous. Use with resmed ventilator    ? rivaroxaban (XARELTO) 20 MG TABS tablet Take 1 tablet (20 mg total) by mouth daily with supper. 30 tablet 3  ? spironolactone (ALDACTONE) 25 MG tablet Take 1 tablet (25 mg total) by mouth every morning. 30 tablet 3  ? torsemide (DEMADEX) 20 MG tablet Take 20mg  (1 Tab) Monday and Friday 60 tablet 3  ? traMADol (ULTRAM) 50 MG tablet Take 50 mg by mouth as needed.    ? YUPELRI 175 MCG/3ML nebulizer solution USE 1 VIAL IN NEBULIZER DAILY - DO NOT MIX WITH OTHER NEB MEDS,USE BEFORE OR AFTER 30 mL 11  ? ?No current facility-administered medications for this encounter.  ? ?Allergies  ?Allergen Reactions  ? Allopurinol Other (See Comments)  ?  Reaction:  Dizziness   ? Clindamycin Anaphylaxis and Hives  ? Pneumococcal 13-Val Conj Vacc Itching, Swelling and Rash  ? Dronedarone Rash  ? Brovana [Arformoterol]   ?  Caused muscle pain  ? Budesonide   ?  Caused extreme joint pain  ? Entresto [Sacubitril-Valsartan] Other (See Comments)  ?  hypotension  ? Fosamax [Alendronate Sodium] Nausea Only  ? Jardiance [Empagliflozin]   ?  Caused a vaginal infection  ? Meperidine Nausea And Vomiting  ? Rosuvastatin Other (See Comments)  ?  Reaction:  Muscle spasms   ? Tetracycline Hives  ? Adhesive [Tape] Rash  ? Lovastatin Rash  ?  Other reaction(s): Muscle Pain  ? ?Social History  ? ?Socioeconomic History  ? Marital status: Widowed  ?  Spouse name: Not on file  ? Number of children: Not on file  ? Years of education: Not on file  ? Highest education level: Not on file  ?Occupational History  ? Occupation: retired  ?Tobacco Use  ? Smoking status: Former  ?  Types: Cigarettes  ?  Quit date: 06/09/1987  ?  Years since quitting: 34.3  ? Smokeless tobacco: Never  ?Vaping Use  ? Vaping Use: Never used  ?Substance and Sexual Activity  ? Alcohol use: No  ?  Alcohol/week: 0.0 standard  drinks  ? Drug use: No  ? Sexual activity: Not Currently  ?Other Topics Concern  ? Not on file  ?Social History Narrative  ? Pt lives in [place] alone.  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  Her son works in [employer].  ? ?Social Determinants of Health  ? ?Financial Resource Strain: Not on file  ?Food Insecurity: Not on file  ?Transportation Needs: Not on file  ?Physical Activity: Not  on file  ?Stress: Not on file  ?Social Connections: Not on file  ?Intimate Partner Violence: Not on file  ? ?Family History  ?Problem Relation Age of Onset  ? Breast cancer Mother 31  ?     3 different times  ? Pancreatic cancer Mother   ? Breast cancer Cousin   ? Colon cancer Neg Hx   ? Esophageal cancer Neg Hx   ? Stomach cancer Neg Hx   ? Inflammatory bowel disease Neg Hx   ? Liver disease Neg Hx   ? Rectal cancer Neg Hx   ? ?BP 120/78   Pulse 72   Wt 88.4 kg   SpO2 97%   BMI 38.04 kg/m?  ? ?Wt Readings from Last 3 Encounters:  ?10/01/21 88.4 kg  ?08/21/21 90.3 kg  ?08/07/21 90.3 kg  ? ?PHYSICAL EXAM: ?General:  Obese woman wearing O2 by [place]. No resp difficulty + chronic cough  ?HEENT: normal ?Neck: supple. no JVD. Carotids 2+ bilat; no bruits. No lymphadenopathy or thryomegaly appreciated. ?Cor: PMI nondisplaced. Regular rate & rhythm. No rubs, gallops or murmurs. ?Lungs: clear ?Abdomen: obese soft, nontender, nondistended. No hepatosplenomegaly. No bruits or masses. Good bowel sounds. ?Extremities: no cyanosis, clubbing, rash, edema ?Neuro: alert & orientedx3, cranial nerves grossly intact. moves all 4 extremities w/o difficulty. Affect pleasant ? ?Device interrogation (personally reviewed): goal 17, today's reading 15 ? ?ASSESSMENT & PLAN:  ?1. Chronic diastolic HF ?- Echo 123456 EF 55%. RV ok  ?- Chronic NYHA III. Volume status ok on exam and Cardiomems.  ?- Continue torsemide 20 mg 2x/week + PRN (Mondays and Fridays). ?- Continue losartan 25 mg daily (failed Entresto due to low BP) ?- Continue spiro 25  daily ?- No Jardiance with frequent UTIs. ?- Encouraged daily compliance Cardiomems. ?- Labs (3/23) ok, SCr 0.67, K 4.1 ? ?2. 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 Keturah Barre

## 2021-10-01 NOTE — Telephone Encounter (Signed)
Sent in application, POI and OOP via fax. ? ?Will follow up. ? ?

## 2021-10-01 NOTE — Addendum Note (Signed)
Encounter addended by: Linda Hedges, RN on: 10/01/2021 1:13 PM ? Actions taken: Visit diagnoses modified, Problem List modified, Order list changed, Diagnosis association updated, Clinical Note Signed

## 2021-10-02 ENCOUNTER — Other Ambulatory Visit (HOSPITAL_COMMUNITY): Payer: Self-pay | Admitting: *Deleted

## 2021-10-02 LAB — CUP PACEART REMOTE DEVICE CHECK
Battery Remaining Longevity: 102 mo
Battery Voltage: 3 V
Brady Statistic AP VP Percent: 69.69 %
Brady Statistic AP VS Percent: 0.01 %
Brady Statistic AS VP Percent: 30.29 %
Brady Statistic AS VS Percent: 0.01 %
Brady Statistic RA Percent Paced: 69.67 %
Brady Statistic RV Percent Paced: 99.98 %
Date Time Interrogation Session: 20230426133837
Implantable Lead Implant Date: 20150527
Implantable Lead Implant Date: 20211221
Implantable Lead Location: 753858
Implantable Lead Location: 753860
Implantable Lead Model: 3830
Implantable Lead Model: 5076
Implantable Pulse Generator Implant Date: 20211221
Lead Channel Impedance Value: 285 Ohm
Lead Channel Impedance Value: 323 Ohm
Lead Channel Impedance Value: 342 Ohm
Lead Channel Impedance Value: 361 Ohm
Lead Channel Impedance Value: 380 Ohm
Lead Channel Impedance Value: 418 Ohm
Lead Channel Impedance Value: 475 Ohm
Lead Channel Impedance Value: 494 Ohm
Lead Channel Impedance Value: 513 Ohm
Lead Channel Pacing Threshold Amplitude: 0.5 V
Lead Channel Pacing Threshold Amplitude: 0.625 V
Lead Channel Pacing Threshold Amplitude: 0.625 V
Lead Channel Pacing Threshold Pulse Width: 0.4 ms
Lead Channel Pacing Threshold Pulse Width: 0.4 ms
Lead Channel Pacing Threshold Pulse Width: 0.4 ms
Lead Channel Sensing Intrinsic Amplitude: 1.875 mV
Lead Channel Sensing Intrinsic Amplitude: 1.875 mV
Lead Channel Sensing Intrinsic Amplitude: 15.25 mV
Lead Channel Sensing Intrinsic Amplitude: 15.25 mV
Lead Channel Setting Pacing Amplitude: 1.25 V
Lead Channel Setting Pacing Amplitude: 1.5 V
Lead Channel Setting Pacing Amplitude: 2 V
Lead Channel Setting Pacing Pulse Width: 0.4 ms
Lead Channel Setting Pacing Pulse Width: 0.4 ms
Lead Channel Setting Sensing Sensitivity: 2 mV

## 2021-10-02 MED ORDER — DOFETILIDE 250 MCG PO CAPS: 250.0000 ug | ORAL_CAPSULE | Freq: Two times a day (BID) | ORAL | 4 refills | Status: DC

## 2021-10-04 ENCOUNTER — Other Ambulatory Visit: Payer: Self-pay

## 2021-10-04 ENCOUNTER — Emergency Department (HOSPITAL_COMMUNITY): Payer: Medicare Other

## 2021-10-04 ENCOUNTER — Encounter (HOSPITAL_COMMUNITY): Payer: Self-pay | Admitting: *Deleted

## 2021-10-04 ENCOUNTER — Inpatient Hospital Stay (HOSPITAL_COMMUNITY)
Admission: EM | Admit: 2021-10-04 | Discharge: 2021-10-09 | DRG: 086 | Disposition: A | Discharge status: Skilled Nursing Facility | Payer: Medicare Other | Attending: Internal Medicine | Admitting: Internal Medicine

## 2021-10-04 DIAGNOSIS — Z87892 Personal history of anaphylaxis: Secondary | ICD-10-CM

## 2021-10-04 DIAGNOSIS — Z7901 Long term (current) use of anticoagulants: Secondary | ICD-10-CM

## 2021-10-04 DIAGNOSIS — R402362 Coma scale, best motor response, obeys commands, at arrival to emergency department: Secondary | ICD-10-CM | POA: Diagnosis present

## 2021-10-04 DIAGNOSIS — M4807 Spinal stenosis, lumbosacral region: Secondary | ICD-10-CM | POA: Diagnosis present

## 2021-10-04 DIAGNOSIS — I480 Paroxysmal atrial fibrillation: Secondary | ICD-10-CM | POA: Diagnosis not present

## 2021-10-04 DIAGNOSIS — E876 Hypokalemia: Secondary | ICD-10-CM | POA: Diagnosis not present

## 2021-10-04 DIAGNOSIS — E039 Hypothyroidism, unspecified: Secondary | ICD-10-CM | POA: Diagnosis present

## 2021-10-04 DIAGNOSIS — I609 Nontraumatic subarachnoid hemorrhage, unspecified: Secondary | ICD-10-CM

## 2021-10-04 DIAGNOSIS — I4811 Longstanding persistent atrial fibrillation: Secondary | ICD-10-CM | POA: Diagnosis present

## 2021-10-04 DIAGNOSIS — E785 Hyperlipidemia, unspecified: Secondary | ICD-10-CM | POA: Diagnosis present

## 2021-10-04 DIAGNOSIS — R402142 Coma scale, eyes open, spontaneous, at arrival to emergency department: Secondary | ICD-10-CM | POA: Diagnosis present

## 2021-10-04 DIAGNOSIS — Z950 Presence of cardiac pacemaker: Secondary | ICD-10-CM

## 2021-10-04 DIAGNOSIS — Z6837 Body mass index (BMI) 37.0-37.9, adult: Secondary | ICD-10-CM

## 2021-10-04 DIAGNOSIS — I130 Hypertensive heart and chronic kidney disease with heart failure and stage 1 through stage 4 chronic kidney disease, or unspecified chronic kidney disease: Secondary | ICD-10-CM | POA: Diagnosis present

## 2021-10-04 DIAGNOSIS — M48061 Spinal stenosis, lumbar region without neurogenic claudication: Secondary | ICD-10-CM | POA: Diagnosis present

## 2021-10-04 DIAGNOSIS — R402252 Coma scale, best verbal response, oriented, at arrival to emergency department: Secondary | ICD-10-CM | POA: Diagnosis present

## 2021-10-04 DIAGNOSIS — Z881 Allergy status to other antibiotic agents status: Secondary | ICD-10-CM

## 2021-10-04 DIAGNOSIS — W010XXA Fall on same level from slipping, tripping and stumbling without subsequent striking against object, initial encounter: Secondary | ICD-10-CM | POA: Diagnosis present

## 2021-10-04 DIAGNOSIS — Z91048 Other nonmedicinal substance allergy status: Secondary | ICD-10-CM

## 2021-10-04 DIAGNOSIS — J9611 Chronic respiratory failure with hypoxia: Secondary | ICD-10-CM | POA: Diagnosis present

## 2021-10-04 DIAGNOSIS — Z90710 Acquired absence of both cervix and uterus: Secondary | ICD-10-CM

## 2021-10-04 DIAGNOSIS — G4733 Obstructive sleep apnea (adult) (pediatric): Secondary | ICD-10-CM | POA: Diagnosis present

## 2021-10-04 DIAGNOSIS — Z87891 Personal history of nicotine dependence: Secondary | ICD-10-CM

## 2021-10-04 DIAGNOSIS — Z79890 Hormone replacement therapy (postmenopausal): Secondary | ICD-10-CM

## 2021-10-04 DIAGNOSIS — M109 Gout, unspecified: Secondary | ICD-10-CM | POA: Diagnosis present

## 2021-10-04 DIAGNOSIS — S066X0A Traumatic subarachnoid hemorrhage without loss of consciousness, initial encounter: Secondary | ICD-10-CM | POA: Diagnosis not present

## 2021-10-04 DIAGNOSIS — I428 Other cardiomyopathies: Secondary | ICD-10-CM | POA: Diagnosis present

## 2021-10-04 DIAGNOSIS — Z9981 Dependence on supplemental oxygen: Secondary | ICD-10-CM

## 2021-10-04 DIAGNOSIS — N189 Chronic kidney disease, unspecified: Secondary | ICD-10-CM | POA: Diagnosis present

## 2021-10-04 DIAGNOSIS — I484 Atypical atrial flutter: Secondary | ICD-10-CM | POA: Diagnosis present

## 2021-10-04 DIAGNOSIS — Z79899 Other long term (current) drug therapy: Secondary | ICD-10-CM

## 2021-10-04 DIAGNOSIS — Z887 Allergy status to serum and vaccine status: Secondary | ICD-10-CM

## 2021-10-04 DIAGNOSIS — I10 Essential (primary) hypertension: Secondary | ICD-10-CM | POA: Diagnosis present

## 2021-10-04 DIAGNOSIS — J449 Chronic obstructive pulmonary disease, unspecified: Secondary | ICD-10-CM | POA: Diagnosis present

## 2021-10-04 DIAGNOSIS — I5032 Chronic diastolic (congestive) heart failure: Secondary | ICD-10-CM | POA: Diagnosis present

## 2021-10-04 DIAGNOSIS — Z888 Allergy status to other drugs, medicaments and biological substances status: Secondary | ICD-10-CM

## 2021-10-04 DIAGNOSIS — K5732 Diverticulitis of large intestine without perforation or abscess without bleeding: Secondary | ICD-10-CM | POA: Diagnosis not present

## 2021-10-04 DIAGNOSIS — S060XAA Concussion with loss of consciousness status unknown, initial encounter: Secondary | ICD-10-CM

## 2021-10-04 DIAGNOSIS — I4891 Unspecified atrial fibrillation: Secondary | ICD-10-CM | POA: Diagnosis present

## 2021-10-04 DIAGNOSIS — E669 Obesity, unspecified: Secondary | ICD-10-CM | POA: Diagnosis present

## 2021-10-04 HISTORY — DX: Concussion with loss of consciousness status unknown, initial encounter: S06.0XAA

## 2021-10-04 LAB — COMPREHENSIVE METABOLIC PANEL
ALT: 13 U/L (ref 0–44)
AST: 18 U/L (ref 15–41)
Albumin: 3.7 g/dL (ref 3.5–5.0)
Alkaline Phosphatase: 31 U/L — ABNORMAL LOW (ref 38–126)
Anion gap: 10 (ref 5–15)
BUN: 20 mg/dL (ref 8–23)
CO2: 25 mmol/L (ref 22–32)
Calcium: 9.2 mg/dL (ref 8.9–10.3)
Chloride: 101 mmol/L (ref 98–111)
Creatinine, Ser: 0.89 mg/dL (ref 0.44–1.00)
GFR, Estimated: >60 mL/min (ref >60)
Glucose, Bld: 127 mg/dL — ABNORMAL HIGH (ref 70–99)
Potassium: 3.9 mmol/L (ref 3.5–5.1)
Sodium: 136 mmol/L (ref 135–145)
Total Bilirubin: 0.7 mg/dL (ref 0.3–1.2)
Total Protein: 6.2 g/dL — ABNORMAL LOW (ref 6.5–8.1)

## 2021-10-04 LAB — CBC WITH DIFFERENTIAL/PLATELET
Abs Immature Granulocytes: 0.18 10*3/uL — ABNORMAL HIGH (ref 0.00–0.07)
Basophils Absolute: 0.1 10*3/uL (ref 0.0–0.1)
Basophils Relative: 1 %
Eosinophils Absolute: 0.2 10*3/uL (ref 0.0–0.5)
Eosinophils Relative: 1 %
HCT: 41.8 % (ref 36.0–46.0)
Hemoglobin: 13.2 g/dL (ref 12.0–15.0)
Immature Granulocytes: 1 %
Lymphocytes Relative: 8 %
Lymphs Abs: 1.1 10*3/uL (ref 0.7–4.0)
MCH: 26.8 pg (ref 26.0–34.0)
MCHC: 31.6 g/dL (ref 30.0–36.0)
MCV: 85 fL (ref 80.0–100.0)
Monocytes Absolute: 0.8 10*3/uL (ref 0.1–1.0)
Monocytes Relative: 6 %
Neutro Abs: 10.4 10*3/uL — ABNORMAL HIGH (ref 1.7–7.7)
Neutrophils Relative %: 83 %
Platelets: 249 10*3/uL (ref 150–400)
RBC: 4.92 MIL/uL (ref 3.87–5.11)
RDW: 15 % (ref 11.5–15.5)
WBC: 12.7 10*3/uL — ABNORMAL HIGH (ref 4.0–10.5)
nRBC: 0 % (ref 0.0–0.2)

## 2021-10-04 LAB — PROTIME-INR
INR: 1.4 — ABNORMAL HIGH (ref 0.8–1.2)
Prothrombin Time: 16.9 seconds — ABNORMAL HIGH (ref 11.4–15.2)

## 2021-10-04 MED ORDER — MORPHINE SULFATE (PF) 2 MG/ML IV SOLN
0.5000 mg | Freq: Once | INTRAVENOUS | Status: AC
  Administered 2021-10-05: 0.5 mg via INTRAVENOUS
  Filled 2021-10-04: qty 1

## 2021-10-04 MED ORDER — MORPHINE SULFATE (PF) 2 MG/ML IV SOLN
2.0000 mg | Freq: Once | INTRAVENOUS | Status: AC
  Administered 2021-10-04: 2 mg via INTRAVENOUS
  Filled 2021-10-04: qty 1

## 2021-10-04 MED ORDER — ONDANSETRON HCL 4 MG/2ML IJ SOLN
4.0000 mg | Freq: Once | INTRAMUSCULAR | Status: AC
  Administered 2021-10-04: 4 mg via INTRAVENOUS
  Filled 2021-10-04: qty 2

## 2021-10-04 MED ORDER — EZETIMIBE 10 MG PO TABS
10.0000 mg | ORAL_TABLET | Freq: Every day | ORAL | Status: DC
Start: 2021-10-05 — End: 2021-10-04

## 2021-10-04 MED ORDER — REVEFENACIN 175 MCG/3ML IN SOLN
175.0000 ug | Freq: Every day | RESPIRATORY_TRACT | Status: DC
  Administered 2021-10-06 – 2021-10-08 (×3): 175 ug via RESPIRATORY_TRACT
  Filled 2021-10-04 (×6): qty 3

## 2021-10-04 MED ORDER — LEVOTHYROXINE SODIUM 50 MCG PO TABS
50.0000 ug | ORAL_TABLET | Freq: Every day | ORAL | Status: DC
  Administered 2021-10-05 – 2021-10-08 (×4): 50 ug via ORAL
  Filled 2021-10-04 (×4): qty 1

## 2021-10-04 MED ORDER — ACETAMINOPHEN 650 MG RE SUPP: 650.0000 mg | Freq: Four times a day (QID) | RECTAL | Status: DC | PRN

## 2021-10-04 MED ORDER — VITAMIN D 25 MCG (1000 UNIT) PO TABS
2000.0000 [IU] | ORAL_TABLET | Freq: Every day | ORAL | Status: DC
  Administered 2021-10-05 – 2021-10-08 (×4): 2000 [IU] via ORAL
  Filled 2021-10-04 (×4): qty 2

## 2021-10-04 MED ORDER — ACETAMINOPHEN 325 MG PO TABS
650.0000 mg | ORAL_TABLET | Freq: Four times a day (QID) | ORAL | Status: DC | PRN
  Administered 2021-10-05 – 2021-10-08 (×8): 650 mg via ORAL
  Filled 2021-10-04 (×8): qty 2

## 2021-10-04 MED ORDER — DOFETILIDE 250 MCG PO CAPS
250.0000 ug | ORAL_CAPSULE | Freq: Two times a day (BID) | ORAL | Status: DC
  Administered 2021-10-05 – 2021-10-08 (×8): 250 ug via ORAL
  Filled 2021-10-04 (×10): qty 1

## 2021-10-04 MED ORDER — ALBUTEROL SULFATE (2.5 MG/3ML) 0.083% IN NEBU: 2.5000 mg | INHALATION_SOLUTION | RESPIRATORY_TRACT | Status: DC | PRN

## 2021-10-04 MED ORDER — LOSARTAN POTASSIUM 50 MG PO TABS
25.0000 mg | ORAL_TABLET | Freq: Every day | ORAL | Status: DC
  Administered 2021-10-05 – 2021-10-08 (×4): 25 mg via ORAL
  Filled 2021-10-04 (×4): qty 1

## 2021-10-04 MED ORDER — HYDRALAZINE HCL 20 MG/ML IJ SOLN
10.0000 mg | INTRAMUSCULAR | Status: DC | PRN
Start: 2021-10-04 — End: 2021-10-09

## 2021-10-04 MED ORDER — MAGNESIUM OXIDE -MG SUPPLEMENT 400 (240 MG) MG PO TABS
400.0000 mg | ORAL_TABLET | Freq: Every day | ORAL | Status: DC
  Administered 2021-10-05 – 2021-10-08 (×4): 400 mg via ORAL
  Filled 2021-10-04 (×4): qty 1

## 2021-10-04 MED ORDER — DOCUSATE SODIUM 100 MG PO CAPS
100.0000 mg | ORAL_CAPSULE | Freq: Two times a day (BID) | ORAL | Status: DC
  Administered 2021-10-05 – 2021-10-08 (×8): 100 mg via ORAL
  Filled 2021-10-04 (×8): qty 1

## 2021-10-04 MED ORDER — SPIRONOLACTONE 25 MG PO TABS
25.0000 mg | ORAL_TABLET | Freq: Every morning | ORAL | Status: DC
Start: 2021-10-05 — End: 2021-10-09
  Administered 2021-10-05 – 2021-10-08 (×4): 25 mg via ORAL
  Filled 2021-10-04 (×4): qty 1

## 2021-10-04 MED ORDER — BEMPEDOIC ACID 180 MG PO TABS: 1.0000 | ORAL_TABLET | Freq: Every day | ORAL | Status: DC

## 2021-10-04 NOTE — ED Triage Notes (Signed)
Thew pt arrived by [facility] from [facility]  the pt fell backward  out of her w/c and the chair fell away from her and she fell to the floor striking her head  she has a laceration to the back of her head a and o on arrival no pain in areas from the neck and head  ?

## 2021-10-04 NOTE — ED Notes (Signed)
Trauma Response Nurse Documentation ? ? ?Kathleen [name] is a 76 y.o. female arriving to [facility] ED via EMS ? ?On Xarelto (rivaroxaban) daily. Trauma was activated as a Level 2 based on the following trauma criteria Elderly patients > 65 with head trauma on anti-coagulation (excluding ASA). Trauma team at the bedside on patient arrival. Patient cleared for CT by Dr. Dina Rich. Patient to CT with team. GCS 15. ? ?Per patient she is from [facility]. She was sitting in her rollater and reached to get something and fell over hitting the back of her head. She takes Xarelto for heart failure. Head is wrapped, reported laceration to back of head. C-collar placed by EMS. ? ?History  ? Past Medical History:  ?Diagnosis Date  ? Asthma   ? Atypical atrial flutter ([place])   ? 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[place])   ? CHF (congestive heart failure) ([place])   ? Chronic renal insufficiency   ? 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 COPD (chronic obstructive pulmonary disease) ([place])   ? Gout   ? Hypertension   ? Longstanding persistent atrial fibrillation ([place])   ? Nonischemic cardiomyopathy ([place])   ? Obesity   ? Pacemaker   ? Spontaneous pneumothorax 2013  ?  ? Past Surgical History:  ?Procedure Laterality Date  ? ABDOMINAL HYSTERECTOMY    ? ATRIAL FIBRILLATION ABLATION  07/20/2013  ? by Dr Westley Gambles  ? AV nodal ablation  11/01/2013  ? by Dr Westley Gambles, repeated by Dr Dwana Curd  ? BREAST BIOPSY Bilateral 1997  ? negative  ? CARDIAC CATHETERIZATION    ? CHOLECYSTECTOMY    ? HERNIA REPAIR    ? PACEMAKER INSERTION  06/2017  ? MDT Viva CRT-P implanted by Dr Westley Gambles after AV nodal ablation,  LV lead could not be placed  ? PACEMAKER INSERTION  05/2020  ? with lead bundle  ?  ? ?Initial Focused Assessment (If applicable, or please see trauma documentation): ?Patient is A&Ox4, reported no loss of consciousness, GCS 15 ?Pupils equal and reactive ?Per patient she did have bleeding from laceration  but this has since stopped after being wrapped ?Complaining of severe pain to back of head and neck ?No other traumatic injury identified ? ?CT's Completed:   ?CT Head and CT C-Spine  ? ?Interventions:  ?CT Head/Cspine ?Pain meds ? ?Consults completed:  ?Neurosurgeon at 1750. ? ?Bedside handoff with ED RN Gerald Stabs C.   ? ?Kathleen [name]  ?Trauma Response RN ? ?Please call TRN at [phone] for further assistance. ?  ?

## 2021-10-04 NOTE — ED Notes (Signed)
This RN reached out to reviving RN Victorino Dike  RN who reports she will let this RN know when she is available to take the patient  ?

## 2021-10-04 NOTE — ED Notes (Signed)
Pt c/o a severe headache no  rt side of her head  she fell backward at the nursing home out of a chair on wheels falling backward striking her head  lac seen by ems  no loc  no other injuries   pain head and neck  c-collar remains in place  ?

## 2021-10-04 NOTE — Consult Note (Signed)
Reason for Consult:SAH Referring Physician: EDP  CARDEN KRIZMAN is an 76 y.o. female.   HPI:  76 year old female presented to the ED tonight after sustaining a fall. She states that she was trying to place something on her rolator and it slipped out from underneath her. She denies any LOC. Complains of some headaches but not dizziness NV or vision changes. She is accompanied by her husband. She has a very extensive medical history including CHF. 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    COPD (chronic obstructive pulmonary disease) ([place])    Gout    Hypertension    Longstanding persistent atrial fibrillation (HCC)    Nonischemic cardiomyopathy ([place])    Obesity    Pacemaker    Spontaneous pneumothorax 2013    Past Surgical History:  Procedure Laterality Date   ABDOMINAL HYSTERECTOMY     ATRIAL FIBRILLATION ABLATION  07/20/2013   by Dr Westley Gambles   AV nodal ablation  11/01/2013   by Dr Westley Gambles, repeated by Dr Dwana Curd   BREAST BIOPSY Bilateral 1997   negative   CARDIAC CATHETERIZATION     CHOLECYSTECTOMY     HERNIA REPAIR     PACEMAKER INSERTION  06/2017   MDT Viva CRT-P implanted by Dr Westley Gambles after AV nodal ablation,  LV lead could not be placed   PACEMAKER INSERTION  05/2020   with lead bundle    Allergies  Allergen Reactions   Allopurinol Other (See Comments)    Reaction:  Dizziness    Clindamycin Anaphylaxis and Hives   Pneumococcal 13-Val Conj Vacc Itching, Swelling and Rash   Dronedarone Rash   Brovana [Arformoterol]     Caused muscle pain   Budesonide     Caused extreme joint pain   Entresto [Sacubitril-Valsartan] Other (See Comments)    hypotension   Fosamax [Alendronate Sodium] Nausea Only    Jardiance [Empagliflozin]     Caused a vaginal infection   Meperidine Nausea And Vomiting   Microplegia Msa-Msg [Plegisol]     Loopy,diarrhea   Rosuvastatin Other (See Comments)    Reaction:  Muscle spasms    Tetracycline Hives   Adhesive [Tape] Rash   Lovastatin Rash    Other reaction(s): Muscle Pain    Social History   Tobacco Use   Smoking status: Former    Types: Cigarettes    Quit date: 06/09/1987    Years since quitting: 34.3   Smokeless tobacco: Never  Substance Use Topics   Alcohol use: No    Alcohol/week: 0.0 standard drinks    Family History  Problem Relation Age of Onset   Breast cancer Mother 16       3 different times   Pancreatic cancer Mother    Breast cancer Cousin    Colon cancer Neg Hx    Esophageal cancer Neg Hx    Stomach cancer Neg Hx    Inflammatory bowel disease Neg Hx    Liver disease Neg Hx    Rectal cancer Neg Hx      Review of Systems  Positive ROS:as above  All other systems have been reviewed and were otherwise negative with the exception of those mentioned in the  HPI and as above.  Objective: Vital signs in last 24 hours: Temp:  [97.9 F (36.6 C)] 97.9 F (36.6 C) (04/29 1651) Pulse Rate:  [69-74] 69 (04/29 1845) Resp:  [13-18] 18 (04/29 1845) BP: (90-149)/(63-72) 115/72 (04/29 1745) SpO2:  [98 %-100 %] 98 % (04/29 1845) Weight:  [88 kg] 88 kg (04/29 1826)  General Appearance: Alert, cooperative, no distress, appears stated age Head: Normocephalic, without obvious abnormality, atraumatic Eyes: PERRL, conjunctiva/corneas clear, EOM's intact, fundi benign, both eyes      Lungs:  respirations unlabored Heart: Regular rate and rhythm Extremities: Extremities normal, atraumatic, no cyanosis or edema Pulses: 2+ and symmetric all extremities Skin: Skin color, texture, turgor normal, no rashes or lesions  NEUROLOGIC:   Mental status: A&O x4, no aphasia, good attention span, Memory and fund of knowledge Motor Exam - grossly normal,  normal tone and bulk Sensory Exam - grossly normal Reflexes: symmetric, no pathologic reflexes, No Hoffman's, No clonus Coordination - grossly normal Gait - not tested Balance - not tested Cranial Nerves: I: smell Not tested  II: visual acuity  OS: na    OD: na  II: visual fields Full to confrontation  II: pupils Equal, round, reactive to light  III,VII: ptosis None  III,IV,VI: extraocular muscles  Full ROM  V: mastication Normal  V: facial light touch sensation  Normal  V,VII: corneal reflex  Present  VII: facial muscle function - upper  Normal  VII: facial muscle function - lower Normal  VIII: hearing Not tested  IX: soft palate elevation  Normal  IX,X: gag reflex Present  XI: trapezius strength  5/5  XI: sternocleidomastoid strength 5/5  XI: neck flexion strength  5/5  XII: tongue strength  Normal    Data Review Lab Results  Component Value Date   WBC 12.7 (H) 10/04/2021   HGB 13.2 10/04/2021   HCT 41.8 10/04/2021   MCV 85.0 10/04/2021   PLT 249 10/04/2021   Lab Results  Component Value Date   NA 136 10/04/2021   K 3.9 10/04/2021   CL 101 10/04/2021   CO2 25 10/04/2021   BUN 20 10/04/2021   CREATININE 0.89 10/04/2021   GLUCOSE 127 (H) 10/04/2021   Lab Results  Component Value Date   INR 1.4 (H) 10/04/2021    Radiology: CT Head Wo Contrast  Result Date: 10/04/2021 CLINICAL DATA:  Trauma, patient on anticoagulationregimen EXAM: CT HEAD WITHOUT CONTRAST TECHNIQUE: Contiguous axial images were obtained from the base of the skull through the vertex without intravenous contrast. RADIATION DOSE REDUCTION: This exam was performed according to the departmental dose-optimization program which includes automated exposure control, adjustment of the mA and/or kV according to patient size and/or use of iterative reconstruction technique. COMPARISON:  04/15/2021 FINDINGS: Brain: There are small foci of increased density in the left parietal cortex. This finding may suggest  small foci of subarachnoid hemorrhage. There is no epidural or subdural fluid collection. There is no demonstrable blood within the ventricles. There is no shift of midline structures. Cortical sulci are prominent. Vascular: Unremarkable. Skull: No fracture is seen in the calvarium. There is subcutaneous hematoma in the right parietal scalp. Sinuses/Orbits: There is mild mucosal thickening in the ethmoid and right maxillary sinuses. There is possible air-fluid level in the left side of sphenoid sinus. Other: None IMPRESSION: There are small foci of subarachnoid hemorrhage in the left parietal lobe. There is no epidural or subdural hematoma. Atrophy. There is subcutaneous hematoma in the right parietal scalp. Sinusitis. Imaging  findings were relayed to Dr. Dina Rich by telephone call. Electronically Signed   By: Elmer Picker M.D.   On: 10/04/2021 17:36   CT Cervical Spine Wo Contrast  Result Date: 10/04/2021 CLINICAL DATA:  Trauma EXAM: CT CERVICAL SPINE WITHOUT CONTRAST TECHNIQUE: Multidetector CT imaging of the cervical spine was performed without intravenous contrast. Multiplanar CT image reconstructions were also generated. RADIATION DOSE REDUCTION: This exam was performed according to the departmental dose-optimization program which includes automated exposure control, adjustment of the mA and/or kV according to patient size and/or use of iterative reconstruction technique. COMPARISON:  None. FINDINGS: Alignment: There is minimal 1-2 mm anterolisthesis at C4-C5 level. This may be due to previous ligament injury and facet degeneration. Skull base and vertebrae: No recent fracture is seen. Tiny smooth marginated calcifications adjacent to the anterior inferior aspects of bodies of C4 and C5 vertebrae may suggest ununited accessory ossification centers. Soft tissues and spinal canal: There is no central spinal stenosis. Disc levels: There is mild encroachment of neural foramina at C3-C4 and C4-C5 levels.  Upper chest: Centrilobular emphysema is seen. Other: There is inhomogeneous attenuation in the thyroid. IMPRESSION: No recent fracture is seen in the cervical spine. Minimal anterolisthesis at C4-C5 level may suggest previous ligament injury and facet degeneration. COPD.  There is inhomogeneous attenuation in the thyroid. Electronically Signed   By: Elmer Picker M.D.   On: 10/04/2021 17:40     Assessment/Plan: 76 year old female presented to the ED tonight after sustaining a fall and hitting her head on the ground. CT head showed a very small SAH in the left parietal lobe, no mass effect or midline shift. No surgical intervention is warranted at this time. Since she is on xarelto and has a very extensive medical history we would recommend medical admission for observation overnight with a repeat head CT in the morning. No blood thinners for now. If her head CT is stable in the morning she can likely be discharged home.    Ocie Cornfield Favio Moder 10/04/2021 10:59 PM

## 2021-10-04 NOTE — H&P (Signed)
?History and Physical  ? ? ?Kathleen [name] [id number] DOB: March 19, 1946 DOA: 10/04/2021 ? ?PCP: [name], MD  ?Patient coming from: Skilled nursing facility. ? ?Chief Complaint: Fall. ? ?HPI: Kathleen [name] is a 76 y.o. female with history of chronic diastolic CHF, atrial fibrillation, chronic respiratory failure with history of Boop, sleep apnea had a fall at concert.  Patient states she missed walker and fell onto the floor hit her head but did not lose consciousness.  Patient sustained a scalp hematoma on the bilateral scalp.  Was brought to the ER. ? ?ED Course: CT head and C-spine shows parietal scalp hematoma and also subarachnoid hemorrhage.  Patient's last dose of Xarelto was 24 hours ago.  On-call neurosurgeon was consulted requested admission for observation.  Not required to any reversal of Xarelto. ? ?Review of Systems: As per HPI, rest all negative. ? ? ?Past Medical History:  ?Diagnosis Date  ? Asthma   ? Atypical atrial flutter ([place])   ? 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[place])   ? CHF (congestive heart failure) ([place])   ? Chronic renal insufficiency   ? 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 COPD (chronic obstructive pulmonary disease) ([place])   ? Gout   ? Hypertension   ? Longstanding persistent atrial fibrillation ([place])   ? Nonischemic cardiomyopathy ([place])   ? Obesity   ? Pacemaker   ? Spontaneous pneumothorax 2013  ? ? ?Past Surgical History:  ?Procedure Laterality Date  ? ABDOMINAL HYSTERECTOMY    ? ATRIAL FIBRILLATION ABLATION  07/20/2013  ? by Dr Westley Gambles  ? AV nodal ablation  11/01/2013  ? by Dr Westley Gambles, repeated by Dr Dwana Curd  ? BREAST BIOPSY Bilateral 1997  ? negative  ? CARDIAC CATHETERIZATION    ? CHOLECYSTECTOMY    ? HERNIA REPAIR    ? PACEMAKER INSERTION  06/2017  ? MDT Viva CRT-P implanted by Dr Westley Gambles after AV nodal ablation,  LV lead could not be placed  ? PACEMAKER INSERTION  05/2020  ? with lead bundle  ? ? ? reports that she quit smoking  about 34 years ago. Her smoking use included cigarettes. She has never used smokeless tobacco. She reports that she does not drink alcohol and does not use drugs. ? ?Allergies  ?Allergen Reactions  ? Allopurinol Other (See Comments)  ?  Reaction:  Dizziness   ? Clindamycin Anaphylaxis and Hives  ? Pneumococcal 13-Val Conj Vacc Itching, Swelling and Rash  ? Dronedarone Rash  ? Brovana [Arformoterol]   ?  Caused muscle pain  ? Budesonide   ?  Caused extreme joint pain  ? Entresto [Sacubitril-Valsartan] Other (See Comments)  ?  hypotension  ? Fosamax [Alendronate Sodium] Nausea Only  ? Jardiance [Empagliflozin]   ?  Caused a vaginal infection  ? Meperidine Nausea And Vomiting  ? Rosuvastatin Other (See Comments)  ?  Reaction:  Muscle spasms   ? Tetracycline Hives  ? Adhesive [Tape] Rash  ? Lovastatin Rash  ?  Other reaction(s): Muscle Pain  ? ? ?Family History  ?Problem Relation Age of Onset  ? Breast cancer Mother 69  ?     3 different times  ? Pancreatic cancer Mother   ? Breast cancer Cousin   ? Colon cancer Neg Hx   ? Esophageal cancer Neg Hx   ? Stomach cancer Neg Hx   ? Inflammatory bowel disease Neg Hx   ? Liver disease Neg Hx   ? Rectal  cancer Neg Hx   ? ? ?Prior to Admission medications   ?Medication Sig Start Date End Date Taking? Authorizing Provider  ?acetaminophen (TYLENOL) 650 MG CR tablet Take 1,300 mg by mouth as needed for pain (take with budesonide).    [provider]  ?albuterol (PROVENTIL HFA;VENTOLIN HFA) 108 (90 Base) MCG/ACT inhaler Inhale 2 puffs into the lungs every 4 (four) hours as needed for wheezing or shortness of breath.    [provider]  ?azelastine (ASTELIN) 0.1 % nasal spray Place 2 sprays into both nostrils 2 (two) times daily. 09/06/20   [provider]  ?Bempedoic Acid (NEXLETOL) 180 MG TABS Take 1 tablet by mouth daily. 08/08/21   [name], MD  ?Cholecalciferol (VITAMIN D3) 50 MCG (2000 UT) TABS Take 2,000 Units by mouth at bedtime.    [provider]  ?cyanocobalamin (,VITAMIN B-12,) 1000 MCG/ML injection Inject 1 mL (1,000 mcg total) into the muscle every 30 (thirty) days. 04/01/21   [name], MD  ?docusate sodium (COLACE) 100 MG capsule Take 100 mg by mouth 2 (two) times daily.    [provider]  ?dofetilide (TIKOSYN) 250 MCG capsule Take 1 capsule (250 mcg total) by mouth in the morning and at bedtime. 10/02/21 10/02/22  Sherran Needs, NP  ?ezetimibe (ZETIA) 10 MG tablet Take 10 mg by mouth daily. 08/07/21   [provider]  ?febuxostat (ULORIC) 40 MG tablet TAKE 1 TABLET(40 MG) BY MOUTH EVERY NIGHT 08/10/21   [name], MD  ?guaiFENesin (MUCINEX) 600 MG 12 hr tablet Take 600 mg by mouth as needed.    [provider]  ?levothyroxine (SYNTHROID) 50 MCG tablet Take 50 mcg by mouth daily before breakfast.    [provider]  ?liver oil-zinc oxide (DESITIN) 40 % ointment Apply 1 application. topically as needed for irritation. 09/03/21   Regan Lemming, MD  ?losartan (COZAAR) 25 MG tablet TAKE 1 TABLET(25 MG) BY MOUTH AT BEDTIME 10/01/21   Bensimhon, Shaune Pascal, MD  ?Magnesium 500 MG TABS Take 500 mg by mouth at bedtime.    [provider]  ?mupirocin ointment (BACTROBAN) 2 % Apply 1 application. topically 2 (two) times daily. 09/09/21   [name], MD  ?nystatin cream (MYCOSTATIN) Apply to affected area 2 times daily 09/08/21   [name], MD  ?OXYGEN Inhale 4 L into the lungs continuous. Use with resmed ventilator    [provider]  ?rivaroxaban (XARELTO) 20 MG TABS tablet Take 1 tablet (20 mg total) by mouth daily with supper. 08/08/21   Bensimhon, Shaune Pascal, MD  ?spironolactone (ALDACTONE) 25 MG tablet Take 1 tablet (25 mg total) by mouth every morning. 06/10/21   Bensimhon, Shaune Pascal, MD  ?torsemide (DEMADEX) 20 MG tablet Take 20mg  (1 Tab) Monday and Friday 06/24/21   Bensimhon, Shaune Pascal, MD  ?traMADol (ULTRAM) 50 MG tablet Take 50 mg by mouth as needed. 05/23/21   [provider]  ?Maretta Bees 175 MCG/3ML nebulizer solution USE 1 VIAL IN NEBULIZER DAILY - DO NOT MIX WITH OTHER NEB MEDS,USE BEFORE OR AFTER 04/02/21   Freddi Starr, MD  ? ? ?Physical Exam: ?Constitutional: Moderately built and nourished. ?Vitals:  ? 10/04/21 1740 10/04/21 1745 10/04/21 1826 10/04/21 1845  ?BP:  115/72    ?Pulse:  71  69  ?Resp:  15  18  ?Temp:      ?SpO2:  100%  98%  ?Weight: 88 kg  88 kg   ?  Height: 5' (1.524 m)  5' (1.524 m)   ? ?Eyes: Anicteric no pallor. ?ENMT: No discharge from the ears eyes nose and mouth.  Scalp hematoma. ?Neck: No neck rigidity. ?Respiratory: No rhonchi or crepitations. ?Cardiovascular: S1-S2 heard. ?Abdomen: Soft nontender bowel sound present. ?Musculoskeletal: No edema. ?Skin: Scalp hematoma. ?Neurologic: Alert awake oriented time place and person.  Moves all extremities. ?Psychiatric: Appears normal.  Normal affect. ? ? ?Labs on Admission: I have personally reviewed following labs and imaging studies ? ?CBC: ?Recent Labs  ?Lab 10/04/21 ?1735  ?WBC 12.7*  ?NEUTROABS 10.4*  ?HGB 13.2  ?HCT 41.8  ?MCV 85.0  ?PLT 249  ? ?Basic Metabolic Panel: ?Recent Labs  ?Lab 10/04/21 ?1735  ?NA 136  ?K 3.9  ?CL 101  ?CO2 25  ?GLUCOSE 127*  ?BUN 20  ?CREATININE 0.89  ?CALCIUM 9.2  ? ?GFR: ?Estimated Creatinine Clearance: 54.7 mL/min (by C-G formula based on SCr of 0.89 mg/dL). ?Liver Function Tests: ?Recent Labs  ?Lab 10/04/21 ?1735  ?AST 18  ?ALT 13  ?ALKPHOS 31*  ?BILITOT 0.7  ?PROT 6.2*  ?ALBUMIN 3.7  ? ?No results for input(s): LIPASE, AMYLASE in the last 168 hours. ?No results for input(s): AMMONIA in the last 168 hours. ?Coagulation Profile: ?Recent Labs  ?Lab 10/04/21 ?1735  ?INR 1.4*  ? ?Cardiac Enzymes: ?No results for input(s): CKTOTAL, CKMB, CKMBINDEX, TROPONINI in the last 168 hours. ?BNP (last 3 results) ?No results for input(s): PROBNP in the last 8760 hours. ?HbA1C: ?No results for input(s): HGBA1C in the last 72 hours. ?CBG: ?No results for input(s): GLUCAP in the last  168 hours. ?Lipid Profile: ?No results for input(s): CHOL, HDL, LDLCALC, TRIG, CHOLHDL, LDLDIRECT in the last 72 hours. ?Thyroid Function Tests: ?No results for input(s): TSH, T4TOTAL, FREET4, T3FREE, THYROIDAB in

## 2021-10-04 NOTE — ED Notes (Signed)
To c-t and back to the room  she is c/o more head and neck pain  ?

## 2021-10-04 NOTE — ED Notes (Signed)
I attempted to give report to 4n I was told  that did not get a page about the room   room assigned for 34 minutes  they will call back ?

## 2021-10-04 NOTE — Progress Notes (Signed)
Chaplain responded to fall on thinners.  Pt comes from nursing facility [facility]). No family is present. Please contact for spiritual care as needed for support.  ? ?Minus Liberty, Chaplain ?Pager:  [phone] ? ? ? 10/04/21 1600  ?Clinical Encounter Type  ?Visited With Patient not available  ?Visit Type Trauma  ?Referral From Physician  ?Consult/Referral To Chaplain  ?Stress Factors  ?Patient Stress Factors Health changes  ? ? ?

## 2021-10-04 NOTE — ED Notes (Signed)
C-collar removed by dr Hyman Bower ?

## 2021-10-04 NOTE — ED Notes (Signed)
Neuro paged kim pa will see ?

## 2021-10-04 NOTE — Progress Notes (Signed)
Orthopedic Tech Progress Note ?Patient Details:  ?Kathleen [name] ?[birth year] ?[id number] ? ?Patient ID: Kathleen [name], female   DOB: 11-11-1945, 76 y.o.   MRN: [id number] ? ?Kathleen [name] ?10/04/2021, 4:53 PMResponded to level 2 trauma. ? ?

## 2021-10-04 NOTE — ED Provider Notes (Signed)
?[facility] ?Provider Note ? ? ?CSN: [id number] ?Arrival date & time: 10/04/21  1641 ? ?  ? ?History ? ?Chief Complaint  ?Patient presents with  ? level 2   ? ? ?Kathleen [name] is a 76 y.o. female. ? ?HPI ? ?76 year old female with past medical history of COPD on chronic oxygen, HTN, heart block, CHF, A-fib anticoagulated on Xarelto presents emergency department with fall on blood thinners.  Patient evaluated as a level 2 trauma with head injury.  Patient was reportedly at her assisted living, tried to get out of her rolling chair when the chair rolled from behind her and she fell back striking the posterior aspect of her head.  No reported loss of consciousness.  Patient states she did not brace herself or sustain any musculoskeletal injury on the fall.  She is currently complaining of posterior head pain.  Patient is reported to be compliant with Xarelto.  C-collar placed prior to arrival.  Patient is on supplemental nasal cannula oxygen which is baseline.  No reported recent illness. ? ?Home Medications ?Prior to Admission medications   ?Medication Sig Start Date End Date Taking? Authorizing Provider  ?acetaminophen (TYLENOL) 650 MG CR tablet Take 1,300 mg by mouth as needed for pain (take with budesonide).    [provider]  ?albuterol (PROVENTIL HFA;VENTOLIN HFA) 108 (90 Base) MCG/ACT inhaler Inhale 2 puffs into the lungs every 4 (four) hours as needed for wheezing or shortness of breath.    [provider]  ?azelastine (ASTELIN) 0.1 % nasal spray Place 2 sprays into both nostrils 2 (two) times daily. 09/06/20   [provider]  ?Bempedoic Acid (NEXLETOL) 180 MG TABS Take 1 tablet by mouth daily. 08/08/21   [name], MD  ?Cholecalciferol (VITAMIN D3) 50 MCG (2000 UT) TABS Take 2,000 Units by mouth at bedtime.    [provider]  ?cyanocobalamin (,VITAMIN B-12,) 1000 MCG/ML injection Inject 1 mL (1,000 mcg total) into the muscle every  30 (thirty) days. 04/01/21   [name], MD  ?docusate sodium (COLACE) 100 MG capsule Take 100 mg by mouth 2 (two) times daily.    [provider]  ?dofetilide (TIKOSYN) 250 MCG capsule Take 1 capsule (250 mcg total) by mouth in the morning and at bedtime. 10/02/21 10/02/22  Sherran Needs, NP  ?ezetimibe (ZETIA) 10 MG tablet Take 10 mg by mouth daily. 08/07/21   [provider]  ?febuxostat (ULORIC) 40 MG tablet TAKE 1 TABLET(40 MG) BY MOUTH EVERY NIGHT 08/10/21   [name], MD  ?guaiFENesin (MUCINEX) 600 MG 12 hr tablet Take 600 mg by mouth as needed.    [provider]  ?levothyroxine (SYNTHROID) 50 MCG tablet Take 50 mcg by mouth daily before breakfast.    [provider]  ?liver oil-zinc oxide (DESITIN) 40 % ointment Apply 1 application. topically as needed for irritation. 09/03/21   Regan Lemming, MD  ?losartan (COZAAR) 25 MG tablet TAKE 1 TABLET(25 MG) BY MOUTH AT BEDTIME 10/01/21   Bensimhon, Shaune Pascal, MD  ?Magnesium 500 MG TABS Take 500 mg by mouth at bedtime.    [provider]  ?mupirocin ointment (BACTROBAN) 2 % Apply 1 application. topically 2 (two) times daily. 09/09/21   [name], MD  ?nystatin cream (MYCOSTATIN) Apply to affected area 2 times daily 09/08/21   [name], MD  ?OXYGEN Inhale 4 L into the lungs continuous. Use with resmed ventilator    [provider]  ?rivaroxaban (XARELTO) 20 MG TABS tablet Take 1 tablet (20 mg total) by mouth daily with supper. 08/08/21   Bensimhon, Shaune Pascal, MD  ?spironolactone (ALDACTONE) 25 MG tablet Take 1 tablet (25 mg total) by mouth every morning. 06/10/21   Bensimhon, Shaune Pascal, MD  ?torsemide (DEMADEX) 20 MG tablet Take 20mg  (1 Tab) Monday and Friday 06/24/21   Bensimhon, Shaune Pascal, MD  ?traMADol (ULTRAM) 50 MG tablet Take 50 mg by mouth as needed. 05/23/21   [provider]  ?Maretta Bees 175 MCG/3ML nebulizer solution USE 1 VIAL IN NEBULIZER DAILY - DO NOT MIX WITH OTHER NEB MEDS,USE BEFORE  OR AFTER 04/02/21   Freddi Starr, MD  ?   ? ?Allergies    ?Allopurinol, Clindamycin, Pneumococcal 13-val conj vacc, Dronedarone, Brovana [arformoterol], Budesonide, Entresto [sacubitril-valsartan], Fosamax [alendronate sodium], Jardiance [empagliflozin], Meperidine, Rosuvastatin, Tetracycline, Adhesive [tape], and Lovastatin   ? ?Review of Systems   ?Review of Systems  ?Constitutional:  Negative for fever.  ?Respiratory:  Negative for shortness of breath.   ?Cardiovascular:  Negative for chest pain.  ?Gastrointestinal:  Negative for abdominal pain, diarrhea and vomiting.  ?Musculoskeletal:  Negative for neck pain.  ?Skin:  Negative for rash.  ?Neurological:  Positive for headaches.  ? ?Physical Exam ?Updated Vital Signs ?BP (!) 149/63   Pulse 74   Temp 97.9 ?F (36.6 ?C)   Resp 16  ?Physical Exam ?Vitals and nursing note reviewed.  ?Constitutional:   ?   General: She is not in acute distress. ?   Appearance: Normal appearance.  ?HENT:  ?   Head: Normocephalic.  ?   Comments: Head wrapped PTA by EMS ?   Mouth/Throat:  ?   Mouth: Mucous membranes are moist.  ?Neck:  ?   Comments: C collar in place ?Cardiovascular:  ?   Rate and Rhythm: Normal rate.  ?Pulmonary:  ?   Effort: Pulmonary effort is normal. No respiratory distress.  ?Abdominal:  ?   Palpations: Abdomen is soft.  ?   Tenderness: There is no abdominal tenderness.  ?Musculoskeletal:     ?   General: No swelling or deformity.  ?   Comments: No bony injury or TTP  ?Skin: ?   General: Skin is warm.  ?Neurological:  ?   Mental Status: She is alert and oriented to person, place, and time. Mental status is at baseline.  ?Psychiatric:     ?   Mood and Affect: Mood normal.  ? ? ?ED Results / Procedures / Treatments   ?Labs ?(all labs ordered are listed, but only abnormal results are displayed) ?Labs Reviewed - No data to display ? ?EKG ?None ? ?Radiology ?No results found. ? ?Procedures ?Marland KitchenCritical Care ?Performed by: Lorelle Gibbs, DO ?Authorized by:  Lorelle Gibbs, DO  ? ?Critical care provider statement:  ?  Critical care time (minutes):  45 ?  Critical care time was exclusive of:  Separately billable procedures and treating other patients ?  Critical care was necessary to treat or prevent imminent or life-threatening deterioration of the following conditions:  Trauma ?  Critical care was time spent personally by me on the following activities:  Development of treatment plan with patient or surrogate, discussions with consultants, evaluation of patient's response to treatment, examination of patient, ordering and review of laboratory studies, ordering and review of radiographic studies, ordering and performing treatments and interventions, pulse oximetry, re-evaluation of patient's condition and review of old charts  ? ? ?Medications Ordered in  ED ?Medications - No data to display ? ?ED Course/ Medical Decision Making/ A&P ?  ?                        ?Medical Decision Making ?Amount and/or Complexity of Data Reviewed ?Labs: ordered. ?Radiology: ordered. ? ?Risk ?Prescription drug management. ?Decision regarding hospitalization. ? ? ?This patient presents to the ED for concern of fall with head injury on blood thinners, this involves an extensive number of treatment options, and is a complaint that carries with it a high risk of complications and morbidity.  The differential diagnosis includes head injury, ICH, concussion ? ? ?Additional history obtained: ?-Additional history obtained from family at bedside ?-External records from outside source obtained and reviewed including: Chart review including previous notes, labs, imaging, consultation notes ? ? ?Lab Tests: ?-I ordered, reviewed, and interpreted labs.  The pertinent results include: Baseline findings ? ? ? ?Imaging Studies ordered: ?-I ordered imaging studies including CT of the head and cervical spine ?-I independently visualized and interpreted imaging which showed punctate left parietal  subarachnoid hemorrhage with right sided parietal hematoma ?-I agree with the radiologist interpretation ? ? ?Medicines ordered and prescription drug management: ?-I ordered medication including pain medicine for heada

## 2021-10-05 ENCOUNTER — Observation Stay (HOSPITAL_COMMUNITY): Payer: Medicare Other

## 2021-10-05 DIAGNOSIS — I609 Nontraumatic subarachnoid hemorrhage, unspecified: Secondary | ICD-10-CM | POA: Diagnosis not present

## 2021-10-05 LAB — BASIC METABOLIC PANEL
Anion gap: 6 (ref 5–15)
BUN: 18 mg/dL (ref 8–23)
CO2: 24 mmol/L (ref 22–32)
Calcium: 9 mg/dL (ref 8.9–10.3)
Chloride: 104 mmol/L (ref 98–111)
Creatinine, Ser: 0.92 mg/dL (ref 0.44–1.00)
GFR, Estimated: >60 mL/min (ref >60)
Glucose, Bld: 136 mg/dL — ABNORMAL HIGH (ref 70–99)
Potassium: 4 mmol/L (ref 3.5–5.1)
Sodium: 134 mmol/L — ABNORMAL LOW (ref 135–145)

## 2021-10-05 LAB — CBC
HCT: 38.2 % (ref 36.0–46.0)
Hemoglobin: 12 g/dL (ref 12.0–15.0)
MCH: 26.4 pg (ref 26.0–34.0)
MCHC: 31.4 g/dL (ref 30.0–36.0)
MCV: 84 fL (ref 80.0–100.0)
Platelets: 226 10*3/uL (ref 150–400)
RBC: 4.55 MIL/uL (ref 3.87–5.11)
RDW: 15.1 % (ref 11.5–15.5)
WBC: 10.3 10*3/uL (ref 4.0–10.5)
nRBC: 0 % (ref 0.0–0.2)

## 2021-10-05 MED ORDER — ORAL CARE MOUTH RINSE
15.0000 mL | Freq: Two times a day (BID) | OROMUCOSAL | Status: DC
  Administered 2021-10-05 – 2021-10-08 (×8): 15 mL via OROMUCOSAL

## 2021-10-05 NOTE — Progress Notes (Signed)
PT awake and alert on 4LNC. Pt states that she has home Cpap but doesn't wear it. Requests to start Cpap tomorrow night as she hasn't been able to rest tonight. . No resp distress noted. Will continue to monitor.  ?

## 2021-10-05 NOTE — Progress Notes (Signed)
PT Cancellation Note ? ?Patient Details ?Name: Kathleen [name] ?MRN: [id number] ?DOB: 09-Aug-1945 ? ? ?Cancelled Treatment:    Reason Eval/Treat Not Completed: Patient at procedure or test/unavailable upon my arrival, leaving unit for CT. Will return for evaluation later this morning.  ? ?Vickki Muff, PT, DPT  ? ?Acute Rehabilitation Department ?Pager #: [phone] - [phone] ? ? ?Kathleen [name] ?10/05/2021, 11:17 AM ?

## 2021-10-05 NOTE — Progress Notes (Signed)
?PROGRESS NOTE ? ? ? ?Kathleen [name]  [id number] DOB: Jan 15, 1946 DOA: 10/04/2021 ?PCP: [name], MD  ? ? ?Brief Narrative:  ? 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  No reported loss of consciousness.  Patient states she missed her rolling walker and fell to the floor.  EMS was called and brought to the ED for further evaluation. ? ?In the ED, temperature 97.9 ?F, HR 74, RR 16, BP 130/66, SPO2 100% on room air.  Sodium 136, potassium 3.9, chloride 101, CO2 25, glucose 127, BUN 20, creatinine 0.89.  WBC 12.7, hemoglobin 13.2, platelets 249.  INR 1.4.  CT head without contrast with small foci of subarachnoid hemorrhage in the left parietal lobe, no epidural/subdural hematoma, noted atrophy and subcutaneous hematoma right parietal scalp.  CT C-spine with no recent fracture seen, minimal anterior listhesis C4-5 suggestive of previous ligamentous injury and facet degeneration.  Neurosurgery was consulted and recommended medical admission for observation with repeat imaging.  Did not receive reversal of Xarelto. ? ?Assessment & Plan: ?  ?Subarachnoid hemorrhage, traumatic ?Patient sustaining a mechanical fall striking the posterior aspect of her head after missing her rolling walker.  No loss of consciousness.  CT head without contrast with small foci of subarachnoid hemorrhage in the left parietal lobe with no epidural/subdural hematoma.  CT C-spine with no recent fracture seen.  Was evaluated by neurosurgery with recommendation of medical admission with further observation and repeat imaging.  Repeat CT head 4/30 shows previous foci of hyperdensity no longer seen. ?--Continue to  hold Xarelto for 72 hours ? ?Chronic diastolic congestive heart failure ?Essential hypertension ?--Losartan 25 mg p.o. daily ?--Spironolactone 25 mg p.o. daily ?--holding home Torsemide 20 mg Monday/Friday ? ?Atrial fibrillation on chronic anticoagulation ?--Tikosyn 250 mg twice daily ?--Holding Xarelto as above for 72 hours ? ?Chronic hypoxic respiratory failure ?Hx BOOP ?--Stable on home 4 L nasal cannula ?--Kathleen [name] neb daily ?--albuterol PRN ? ?Hypothyroidism ?--Levothyroxine 50 mcg p.o. daily ? ?OSA: Continue nocturnal CPAP ? ?Morbid obesity ?Body mass index is 37.89 kg/m?[name]  Discussed with patient needs for aggressive lifestyle changes/weight loss as this complicates all facets of care.  Outpatient follow-up with PCP. ?Physical deconditioning ?Gait disturbance ?L-spine spine and bilateral hip x-rays negative for fracture.  Patient continues with difficulty ambulating following a fall.  Seen by PT/OT with recommendation of SNF placement as patient does not have family support at home and would have difficulty navigating stairs and to get to her bathroom. ?--TOC for SNF placement ? ?DVT prophylaxis: SCDs Start: 10/04/21 2041 ? ?  Code Status: Full Code ?Family Communication: no family present at bedside this am ? ?Disposition Plan:  ?Level of care: Progressive ?Status is: Observation ?The patient remains OBS appropriate and will d/c before 2 midnights. ?  ? ?Consultants:  ?Neurosurgery ? ?Procedures:  ?None ? ?Antimicrobials:  ?None ? ? ?Subjective: ?Seen at bedside, resting comfortably.  Sitting in bedside chair.  Repeat head CT this morning shows resolution of SAH.  Continues with ambulatory dysfunction, pain.  Work with PT/OT.  Requesting SNF placement as has poor social support at independent living would have to navigate stairs and believes she is unable to.  No other questions or concerns at this time.  Denies headache, no visual changes, no fever/chills/night sweats, no nausea/vomiting/diarrhea, no chest  pain, no palpitations, no shortness of breath, no abdominal pain, no focal weakness, no fatigue, no paresthesias.  No acute events overnight per nurse staff. ? ?Objective: ?Vitals:  ? 10/05/21 0032 10/05/21 0320 10/05/21 0743 10/05/21 1135  ?BP: (!) 126/50 97/73 (!) 94/50 108/64  ?Pulse: 72 70 70 69  ?Resp: 19 15 19  (!) 22  ?Temp: 98.6 ?F (37 ?C) 98 ?F (36.7 ?C) 98.5 ?F (36.9 ?C) 98 ?F (36.7 ?C)  ?TempSrc: Oral Oral Oral Oral  ?SpO2: 100% 97% 96% 95%  ?Weight:      ?Height:      ? ? ?Intake/Output Summary (Last 24 hours) at 10/05/2021 1315 ?Last data filed at 10/04/2021 2003 ?Gross per 24 hour  ?Intake 0 ml  ?Output 0 ml  ?Net 0 ml  ? ?Filed Weights  ? 10/04/21 1740 10/04/21 1826  ?Weight: 88 kg 88 kg  ? ? ?Examination: ? ?Physical Exam: ?GEN: NAD, alert and oriented x 3, obese ?HEENT: NCAT, PERRL, EOMI, sclera clear, MMM ?PULM: CTAB w/o wheezes/crackles, normal respiratory effort, on 4 L nasal cannula which is her baseline ?CV: RRR w/o M/G/R ?GI: abd soft, NTND, NABS, no R/G/M ?MSK: no peripheral edema, muscle strength globally intact 5/5 bilateral upper/lower extremities ?NEURO: CN II-XII intact, no focal deficits, sensation to light touch intact ?PSYCH: normal mood/affect ?Integumentary: dry/intact, no rashes or wounds ? ? ? ?Data Reviewed: I have personally reviewed following labs and imaging studies ? ?CBC: ?Recent Labs  ?Lab 10/04/21 ?1735 10/05/21 ?0135  ?WBC 12.7* 10.3  ?NEUTROABS 10.4*  --   ?HGB 13.2 12.0  ?HCT 41.8 38.2  ?MCV 85.0 84.0  ?PLT 249 226  ? ?Basic Metabolic Panel: ?Recent Labs  ?Lab 10/04/21 ?1735 10/05/21 ?0135  ?NA 136 134*  ?K 3.9 4.0  ?CL 101 104  ?CO2 25 24  ?GLUCOSE 127* 136*  ?BUN 20 18  ?CREATININE 0.89 0.92  ?CALCIUM 9.2 9.0  ? ?GFR: ?Estimated Creatinine Clearance: 52.9 mL/min (by C-G formula based on SCr of 0.92 mg/dL). ?Liver Function Tests: ?Recent Labs  ?Lab 10/04/21 ?1735  ?AST 18  ?ALT 13  ?ALKPHOS 31*  ?BILITOT 0.7  ?PROT 6.2*  ?ALBUMIN 3.7  ? ?No results for input(s): LIPASE,  AMYLASE in the last 168 hours. ?No results for input(s): AMMONIA in the last 168 hours. ?Coagulation Profile: ?Recent Labs  ?Lab 10/04/21 ?1735  ?INR 1.4*  ? ?Cardiac Enzymes: ?No results for input(s): CKTOTAL, CKMB, CKMBINDEX, TROPONINI in the last 168 hours. ?BNP (last 3 results) ?No results for input(s): PROBNP in the last 8760 hours. ?HbA1C: ?No results for input(s): HGBA1C in the last 72 hours. ?CBG: ?No results for input(s): GLUCAP in the last 168 hours. ?Lipid Profile: ?No results for input(s): CHOL, HDL, LDLCALC, TRIG, CHOLHDL, LDLDIRECT in the last 72 hours. ?Thyroid Function Tests: ?No results for input(s): TSH, T4TOTAL, FREET4, T3FREE, THYROIDAB in the last 72 hours. ?Anemia Panel: ?No results for input(s): VITAMINB12, FOLATE, FERRITIN, TIBC, IRON, RETICCTPCT in the last 72 hours. ?Sepsis Labs: ?No results for input(s): PROCALCITON, LATICACIDVEN in the last 168 hours. ? ?No results found for this or any previous visit (from the past 240 hour(s)).  ? ? ? ? ? ?Radiology Studies: ?DG Lumbar Spine 2-3 Views ? ?Result Date: 10/05/2021 ?CLINICAL DATA:  76 year old female with right side low back pain. EXAM: LUMBAR SPINE - 2-3 VIEW COMPARISON:  CT Abdomen and Pelvis  06/26/2019. FINDINGS: Stable cholecystectomy clips. Mild levoconvex lumbar scoliosis. Normal lumbar segmentation. Stable lumbar lordosis since 2021 with mild grade anterolisthesis of L4 on L5. Mild to moderate lower lumbar facet hypertrophy. Stable disc spaces. No acute osseous abnormality identified. Calcified aortic atherosclerosis. Partially visible chronic cardiac pacemaker. Negative visible lung bases and bowel gas. IMPRESSION: 1. No acute osseous abnormality identified in the lumbar spine. 2. Chronic grade 1 anterolisthesis of L4 on L5 with facet arthropathy. 3.  Aortic Atherosclerosis (ICD10-I70.0). Electronically Signed   By: Genevie Ann M.D.   On: 10/05/2021 11:12  ? ?CT HEAD WO CONTRAST (5MM) ? ?Result Date: 10/05/2021 ?CLINICAL DATA:  Stroke,  follow up EXAM: CT HEAD WITHOUT CONTRAST TECHNIQUE: Contiguous axial images were obtained from the base of the skull through the vertex without intravenous contrast. RADIATION DOSE REDUCTION: This exam was

## 2021-10-05 NOTE — Care Management Obs Status (Signed)
MEDICARE OBSERVATION STATUS NOTIFICATION ? ? ?Patient Details  ?Name: Kathleen [name] ?MRN: [id number] ?Date of Birth: 05/12/1946 ? ? ?Medicare Observation Status Notification Given:  Yes ? ? ? ?Windle Guard, LCSW ?10/05/2021, 2:16 PM ?

## 2021-10-05 NOTE — ED Notes (Signed)
Report handed off to Cheri Rous RN on 4N ?

## 2021-10-05 NOTE — TOC Initial Note (Addendum)
Transition of Care (TOC) - Initial/Assessment Note  ? ? ?Patient Details  ?Name: Kathleen [name] ?MRN: [id number] ?Date of Birth: [birth year] ? ?Transition of Care (TOC) CM/SW Contact:    ?Alfredia Ferguson, LCSW ?Phone Number: ?10/05/2021, 1:57 PM ? ?Clinical Narrative:                 ?CSW advised by attending patient will need SNF per PT recommendation and not having sufficient supports at her independent living facility. CSW met with patient and introduced herself and role. CSW discussed the referral process and confirmed preference for [facility]. Patient provided verbal consent for SNF referrals. CSW informed patient of observation status and discussed the [facility] letter with patient but patient declined to sign. Patient was informed it was a confirmation she was informed but she affirmed. CSW updated RNCM to see what follow-up is appropriate. CSW has initiated SNF referrals.  ? ?2:45p: CSW provided patient copy of completed [place] letter.  ? ?Expected Discharge Plan: [place] ?Barriers to Discharge: SNF Pending bed offer ? ? ?Patient Goals and CMS Choice ?Patient states their goals for this hospitalization and ongoing recovery are:: "I'm in so much pain I think rehab will help." ?CMS Medicare.gov Compare Post Acute Care list provided to:: Patient ?Choice offered to / list presented to : Patient ? ?Expected Discharge Plan and Services ?Expected Discharge Plan: [place] ?  ?  ?Post Acute Care Choice: [place] ?  ?                ?  ?  ?  ?  ?  ?  ?  ?  ?  ?  ? ?Prior Living Arrangements/Services ?  ?Lives with:: Self ?Patient language and need for interpreter reviewed:: Yes ?Do you feel safe going back to the place where you live?: Yes      ?Need for Family Participation in Patient Care: No (Comment) ?Care giver support system in place?: No (comment) ?  ?Criminal Activity/Legal Involvement Pertinent to Current Situation/Hospitalization: No - Comment as needed ? ?Activities of  Daily Living ?  ?  ? ?Permission Sought/Granted ?Permission sought to share information with : Customer service manager ?Permission granted to share information with : Yes, Verbal Permission Granted ? Share Information with NAME: for SNF referrals ?   ?   ?   ? ?Emotional Assessment ?Appearance:: Appears older than stated age ?Attitude/Demeanor/Rapport: Engaged ?Affect (typically observed): Accepting ?Orientation: : Oriented to Self, Oriented to Place, Oriented to  Time, Oriented to Situation ?Alcohol / Substance Use: Not Applicable ?Psych Involvement: No (comment) ? ?Admission diagnosis:  Subarachnoid hemorrhage ([place]) [I60.9] ?SAH (subarachnoid hemorrhage) ([place]) [I60.9] ?Subarachnoid bleed ([place]) [I60.9] ?Patient Active Problem List  ? Diagnosis Date Noted  ? Subarachnoid hemorrhage ([place]) 10/04/2021  ? Subarachnoid bleed ([place]) 10/04/2021  ? Gout 10/01/2021  ? Hyperlipidemia with target LDL less than 100 08/07/2021  ? Cough productive of purulent sputum 08/07/2021  ? LRTI (lower respiratory tract infection) 08/07/2021  ? Chronic anticoagulation 07/07/2021  ? Dysphagia 07/07/2021  ? Abnormal CT scan, colon 07/07/2021  ? History of colon polyps 07/07/2021  ? Stage 3a chronic kidney disease ([place]) 05/23/2021  ? Allergic contact dermatitis due to drugs in contact with skin 05/23/2021  ? Candidiasis of skin 05/23/2021  ? Dietary iron deficiency 05/23/2021  ? Age-related osteoporosis without current pathological fracture 04/22/2021  ? Type 2 diabetes mellitus with diabetic neuropathy, without long-term current use of insulin ([place]) 04/22/2021  ? Chronic idiopathic constipation 04/22/2021  ?  Daytime somnolence 01/22/2021  ? Sleep apnea 01/22/2021  ? Constipation   ? (HFpEF) heart failure with preserved ejection fraction ([place]) 06/26/2019  ? Chronic systolic heart failure ([place]) 04/06/2018  ? HTN (hypertension) 04/06/2018  ? COPD (chronic obstructive pulmonary disease) ([place]) 04/06/2018  ? Atrial fibrillation ([place])  04/06/2018  ? Chronic respiratory failure with hypoxia ([place]) 03/28/2018  ? Malnutrition of moderate degree 09/11/2015  ? ?PCP:  [name], MD ?Pharmacy:   ?[facility] [place], [address] ?[address] ?[place] [address] ?Phone: [phone] Fax: [phone] ? ?[place], [address] ?[address] ?[address] ?[place] [address] ?Phone: [phone] Fax: [phone] ? ?[facility] [place] [place] [address] [place] ?[address] ?[place] [id number] ?Phone: [phone] Fax: [phone] ? ? ? ? ?Social Determinants of Health (SDOH) Interventions ?  ? ?Readmission Risk Interventions ? ?  06/28/2019  ?  3:42 PM  ?Readmission Risk Prevention Plan  ?Transportation Screening Complete  ?PCP or Specialist Appt within 3-5 Days Complete  ?[place] or Home Care Consult Complete  ?Palliative Care Screening Not Applicable  ?Medication Review Press photographer) Complete  ? ? ? ?

## 2021-10-05 NOTE — Progress Notes (Signed)
Placed patient on bipap 10/5 with oxygen set at 4lpm with Sp02=97^ ? ?

## 2021-10-05 NOTE — Progress Notes (Signed)
NEUROSURGERY PROGRESS NOTE ? ?Doing well. Complains of appropriate head soreness. ?No numbness, tingling or weakness ?Ambulating and voiding well ?Good strength and sensation ? ?Temp:  [97.9 ?F (36.6 ?C)-98.6 ?F (37 ?C)] 98.5 ?F (36.9 ?C) (04/30 [id number]) ?Pulse Rate:  [69-74] 70 (04/30 0743) ?Resp:  [13-26] 19 (04/30 0743) ?BP: (90-149)/(45-88) 94/50 (04/30 0743) ?SpO2:  [96 %-100 %] 96 % (04/30 0743) ?Weight:  [88 kg] 88 kg (04/29 1826) ? ?Plan: ?S/p fall with small sah on xarelto. Once follow up head CT is done this morning, if it is stable, ok to discharge home from our standpoint. Can restart xarelto in 48-72 hours if CT scan stable.  ? ?Kathleen Chiquito, NP ?10/05/2021 ?8:21 AM ?  ?

## 2021-10-05 NOTE — Evaluation (Signed)
Physical Therapy Evaluation Patient Details Name: Kathleen [name] MRN: [id number] DOB: Nov 06, 1945 Today's Date: 10/05/2021  History of Present Illness  The pt is a 76 yo female presenting 4/29 after a fall at her independent living resulting in injury to the back of her head. CT revealed: subarachnoid hemorrhage, no other acute injury or abnormalities. PMH includes: 3L chronic oxygen dependence, pacemaker, A-fib, CHF, asthma/COPD, OSA and OHS.   Clinical Impression  Pt in bed upon arrival of PT, agreeable to evaluation at this time. Prior to admission the pt was mobilizing with use of rollator, independent with all ADLs, driving, and did not need any assist to manage at her independent living facility. The pt now presents with limitations in functional mobility, strength, power, endurance, and dynamic stability due to above dx and resulting pain, and will continue to benefit from skilled PT to address these deficits. The pt initially required minA to complete sit-stand transfers, but was able to progress to slow movements without physical assist, getting cues for technique and hand placement only. She was able to complete slow but generally steady ambulation in her room with RW and minG for safety. She also completed ~60 ft of hallway ambulation with slow, step-to gait pattern due to pain in LLE.   The pt currently requires short duration of increased assist for mobility in her home and assist with ADLs, we discussed availability of extra assist from the facility or hiring an aide with the pt, but she states she needs to contact her son about getting increased assist. If no assist is available and the pt will be home alone at d/c, would recommend short stint SNF rehab as she is at increased risk of falls given strength and mobility deficits at present.   Gait Speed: 0.41m/s using RW and with 4L O2. (Gait speed <0.83m/s indicates increased risk of falls and dependence in ADLs)         Recommendations  for follow up therapy are one component of a multi-disciplinary discharge planning process, led by the attending physician.  Recommendations may be updated based on patient status, additional functional criteria and insurance authorization.  Follow Up Recommendations Home health PT (SNF if no assist is available)    Assistance Recommended at Discharge Intermittent Supervision/Assistance  Patient can return home with the following  A little help with walking and/or transfers;A little help with bathing/dressing/bathroom;Assistance with cooking/housework;Assist for transportation;Help with stairs or ramp for entrance    Equipment Recommendations Rolling walker (2 wheels) (shower seat)  Recommendations for Other Services       Functional Status Assessment Patient has had a recent decline in their functional status and demonstrates the ability to make significant improvements in function in a reasonable and predictable amount of time.     Precautions / Restrictions Precautions Precautions: Fall Restrictions Weight Bearing Restrictions: No      Mobility  Bed Mobility Overal bed mobility: Needs Assistance Bed Mobility: Supine to Sit     Supine to sit: Min guard     General bed mobility comments: pt initially coming to sitting EOB then impulsively returning to supine. able to complete a second time slowly with increased effort, no assist but max encouragement    Transfers Overall transfer level: Needs assistance Equipment used: Rolling walker (2 wheels) Transfers: Sit to/from Stand Sit to Stand: Min assist, Min guard           General transfer comment: minA to boost to standing initially, then able to complete transfers from [facility]  and recliner without assist, cues for hand positioning    Ambulation/Gait Ambulation/Gait assistance: Min guard Gait Distance (Feet): 15 Feet (+ 60 ft) Assistive device: Rolling walker (2 wheels) Gait Pattern/deviations: Step-to pattern, Decreased  stance time - left, Decreased stride length, Shuffle Gait velocity: 0.14 m/s Gait velocity interpretation: <1.31 ft/sec, indicative of household ambulator   General Gait Details: pt with small steps and minimal clearance. step-to pattern with dependence on BUE support. slowed speed    Balance Overall balance assessment: Needs assistance Sitting-balance support: No upper extremity supported, Feet supported Sitting balance-Leahy Scale: Fair Sitting balance - Comments: able to lean slightly outside BOS   Standing balance support: Bilateral upper extremity supported, During functional activity Standing balance-Leahy Scale: Poor Standing balance comment: static stance at sink without UE support, BUE support for gait                             Pertinent Vitals/Pain Pain Assessment Pain Assessment: Faces Faces Pain Scale: Hurts whole lot Pain Location: L hip and low back pain Pain Descriptors / Indicators: Discomfort, Pressure Pain Intervention(s): Monitored during session, Repositioned    Home Living Family/patient expects to be discharged to:: Private residence Living Arrangements: Alone Available Help at Discharge: Family;Available PRN/intermittently Type of Home: Independent living facility Home Access: Elevator       Home Layout: One level Home Equipment: Rollator (4 wheels);Cane - single point;Shower seat Additional Comments: pt reports "far" distance from room to dining hall at facility    Prior Function Prior Level of Function : Independent/Modified Independent             Mobility Comments: Uses rollator to carry oxygen, uses SPC for community. Drives. Does IADLs. ADLs Comments: Independent     Hand Dominance   Dominant Hand: Right    Extremity/Trunk Assessment   Upper Extremity Assessment Upper Extremity Assessment: Defer to OT evaluation    Lower Extremity Assessment Lower Extremity Assessment: Generalized weakness;LLE deficits/detail LLE  Deficits / Details: significant pain in L hip and low back, pt informed imaging is clear of acute bony injury. Pt able to bear wt but limited by pain LLE: Unable to fully assess due to pain LLE Sensation: WNL    Cervical / Trunk Assessment Cervical / Trunk Assessment: Kyphotic;Other exceptions Cervical / Trunk Exceptions: large body habitus, bloody bump on posterior aspect of head  Communication   Communication: No difficulties  Cognition Arousal/Alertness: Awake/alert Behavior During Therapy: WFL for tasks assessed/performed Overall Cognitive Status: Within Functional Limits for tasks assessed                                 General Comments: pt with slightly flat affect and slowed responses. May be close to baseline personality. fearful of pain and avoiding movement due to pain at times until encouraged to perform        General Comments General comments (skin integrity, edema, etc.): BP in supine: 108/64; 70 BPM; 95% O2    Exercises     Assessment/Plan    PT Assessment Patient needs continued PT services  PT Problem List Decreased strength;Decreased range of motion;Decreased activity tolerance;Decreased balance;Decreased mobility;Decreased coordination;Pain       PT Treatment Interventions DME instruction;Gait training;Stair training;Functional mobility training;Therapeutic activities;Therapeutic exercise;Balance training;Patient/family education    PT Goals (Current goals can be found in the Care Plan section)  Acute Rehab PT Goals Patient Stated  Goal: to reduce pain, maintain independence PT Goal Formulation: With patient Time For Goal Achievement: 10/19/21 Potential to Achieve Goals: Good    Frequency Min 4X/week        AM-PAC PT "6 Clicks" Mobility  Outcome Measure Help needed turning from your back to your side while in a flat bed without using bedrails?: None Help needed moving from lying on your back to sitting on the side of a flat bed without  using bedrails?: A Little Help needed moving to and from a bed to a chair (including a wheelchair)?: A Little Help needed standing up from a chair using your arms (e.g., wheelchair or bedside chair)?: A Little Help needed to walk in hospital room?: A Little Help needed climbing 3-5 steps with a railing? : A Lot 6 Click Score: 18    End of Session Equipment Utilized During Treatment: Gait belt;Oxygen Activity Tolerance: Patient limited by pain Patient left: in chair;with call bell/phone within reach;with chair alarm set Nurse Communication: Mobility status PT Visit Diagnosis: Other abnormalities of gait and mobility (R26.89);Muscle weakness (generalized) (M62.81);Pain Pain - Right/Left: Left Pain - part of body: Hip;Leg    Time: 1191-4782 PT Time Calculation (min) (ACUTE ONLY): 40 min   Charges:   PT Evaluation $PT Eval Moderate Complexity: 1 Mod          Vickki Muff, PT, DPT   Acute Rehabilitation Department Pager #: [phone]  Ronnie Derby 10/05/2021, 12:45 PM

## 2021-10-05 NOTE — Evaluation (Signed)
Occupational Therapy Evaluation ?Patient Details ?Name: Kathleen [name] ?MRN: [id number] ?DOB: [birth year] ?Today's Date: 10/05/2021 ? ? ?History of Present Illness The pt is a 76 yo female presenting 4/29 after a fall at her independent living resulting in injury to the back of her head. CT revealed: subarachnoid hemorrhage, no other acute injury or abnormalities. PMH includes: 3L chronic oxygen dependence, pacemaker, A-fib, CHF, asthma/COPD, OSA and OHS.  ? ?Clinical Impression ?  ?Pt PTA: Pt living in ILF, 1 meal delivered to room; reports independence with ADL and mobility using a RW. Pt reports that her son and family live nearby, but pt could not determine amount of assist that family could provide. Pt currently,  limited by decreased strength, decreased ability to care for self and increased pain. Pt with pain mostly with transitional movement and when leaning on varying sides of hips. Pt set-upA to modA overall for ADL tasks. Pt minA to minguardA for mobility with heavy reliance on RW.  ?BP in supine: 108/64 (78); 70 BPM; 95% O2;  ?BP in sitting 94/73(80), 72BPM, 97% O2.(dizziness resolves within a few seconds)  ?O2 on 4L throughout session. ?Pt would benefit from continued OT skilled services. OT following acutely. ? ?** Recommending SNF first and then [facility] if that falls through. Pt lives alone and is in increased pain with limited ability to help herself.  ? ?Recommendations for follow up therapy are one component of a multi-disciplinary discharge planning process, led by the attending physician.  Recommendations may be updated based on patient status, additional functional criteria and insurance authorization.  ? ?Follow Up Recommendations ? Skilled nursing-short term rehab (<3 hours/day) (If pt can have a home health aid or family to assist for 2-4 hours/day for ADL needs and have her meals delivered to her room, than HHOT would be beneficial. Due to her increased pain, it would not be safe to send her back  to ILF without extra assist.)  ?  ?Assistance Recommended at Discharge    ?Patient can return home with the following A little help with walking and/or transfers;Assistance with cooking/housework;Assist for transportation;A little help with bathing/dressing/bathroom ? ?  ?Functional Status Assessment ? Patient has had a recent decline in their functional status and demonstrates the ability to make significant improvements in function in a reasonable and predictable amount of time.  ?Equipment Recommendations ? BSC/3in1  ?  ?Recommendations for Other Services   ? ? ?  ?Precautions / Restrictions Precautions ?Precautions: Fall ?Restrictions ?Weight Bearing Restrictions: No  ? ?  ? ?Mobility Bed Mobility ?Overal bed mobility: Needs Assistance ?Bed Mobility: Supine to Sit ?  ?  ?Supine to sit: Min guard ?  ?  ?General bed mobility comments: pt initially coming to sitting EOB then impulsively returning to supin reporting dizzines. Able to complete a second time slowly with increased effort, no assist but max encouragement ?  ? ?Transfers ?Overall transfer level: Needs assistance ?Equipment used: Rolling walker (2 wheels) ?Transfers: Sit to/from Stand ?Sit to Stand: Min assist, Min guard ?  ?  ?  ?  ?  ?General transfer comment: minA to boost to standing initially, then able to complete transfers from [facility] and recliner without assist, cues for hand positioning ?  ? ?  ?Balance Overall balance assessment: Needs assistance ?Sitting-balance support: No upper extremity supported, Feet supported ?Sitting balance-Leahy Scale: Fair ?Sitting balance - Comments: able to lean slightly outside BOS ?  ?Standing balance support: Bilateral upper extremity supported, During functional activity ?Standing balance-Leahy Scale: Poor ?  Standing balance comment: static stance at sink without UE support, BUE support for gait ?  ?  ?  ?  ?  ?  ?  ?  ?  ?  ?  ?   ? ?ADL either performed or assessed with clinical judgement  ? ?ADL Overall ADL's :  Needs assistance/impaired ?Eating/Feeding: Set up;Sitting ?  ?Grooming: Supervision/safety;Standing ?Grooming Details (indicate cue type and reason): stood at sink x3 mins ?Upper Body Bathing: Minimal assistance;Sitting ?  ?Lower Body Bathing: Maximal assistance;Sitting/lateral leans;Sit to/from stand;Cueing for safety ?  ?Upper Body Dressing : Minimal assistance;Sitting ?  ?Lower Body Dressing: Maximal assistance;Sitting/lateral leans;Sit to/from stand;Cueing for safety ?Lower Body Dressing Details (indicate cue type and reason): can perform donning L shoe using figure 4 technique, but assisted with R shoe. ?Toilet Transfer: Min guard;Ambulation;Rolling walker (2 wheels);Grab bars ?Toilet Transfer Details (indicate cue type and reason): verbal cues for hand placement ?Toileting- Clothing Manipulation and Hygiene: Moderate assistance;Sitting/lateral lean;Sit to/from stand;Cueing for safety ?Toileting - Clothing Manipulation Details (indicate cue type and reason): supervisionA for anterior pericare ?  ?  ?Functional mobility during ADLs: Min guard;Rolling walker (2 wheels);Cueing for safety ?General ADL Comments: Pt limited by decreased strength, decreased ability to care for self and increased pain. Pt with pain mostly with transitional movement and when leaning on varying sides of hips.  ? ? ? ?Vision Baseline Vision/History: 0 No visual deficits ?Ability to See in Adequate Light: 0 Adequate ?Patient Visual Report: No change from baseline ?Vision Assessment?: Yes ?Eye Alignment: Within Functional Limits ?Ocular Range of Motion: Within Functional Limits ?Alignment/Gaze Preference: Within Defined Limits ?Additional Comments: dizziness with initial sitting; closed eyes and laid back down- pt able to try again to steady self; no blurriness reported.  ?   ?Perception   ?  ?Praxis   ?  ? ?Pertinent Vitals/Pain Pain Assessment ?Pain Assessment: Faces ?Pain Score: 6  ?Faces Pain Scale: Hurts whole lot ?Pain Location: L hip  and low back pain ?Pain Descriptors / Indicators: Discomfort, Pressure ?Pain Intervention(s): Monitored during session  ? ? ? ?Hand Dominance Right ?  ?Extremity/Trunk Assessment Upper Extremity Assessment ?Upper Extremity Assessment: Generalized weakness;RUE deficits/detail;LUE deficits/detail ?RUE Deficits / Details: 3+/5 strength ?LUE Deficits / Details: 3+/5 strength ?  ?Lower Extremity Assessment ?Lower Extremity Assessment: Generalized weakness ?LLE Deficits / Details: significant pain in L hip and low back. ?LLE: Unable to fully assess due to pain ?LLE Sensation: WNL ?  ?Cervical / Trunk Assessment ?Cervical / Trunk Assessment: Kyphotic;Other exceptions ?Cervical / Trunk Exceptions: large body habitus, bloody bump on posterior aspect of pt's head ?  ?Communication Communication ?Communication: No difficulties ?  ?Cognition Arousal/Alertness: Awake/alert ?Behavior During Therapy: [facility] for tasks assessed/performed ?Overall Cognitive Status: Within Functional Limits for tasks assessed ?  ?  ?  ?  ?  ?  ?  ?  ?  ?  ?  ?  ?  ?  ?  ?  ?General Comments: Pt with slightly flat affect and slowed responses. May be close to baseline personality; Fearful of pain and avoiding movement due to pain at times until encouraged to perform. ?  ?  ?General Comments  (dizziness resolves within a few seconds) ? ?  ?Exercises   ?  ?Shoulder Instructions    ? ? ?Home Living Family/patient expects to be discharged to:: Private residence ?Living Arrangements: Alone ?Available Help at Discharge: Family;Available PRN/intermittently ?Type of Home: Independent living facility ?Home Access: Elevator ?  ?  ?Home Layout: One level ?  ?  ?  Bathroom Shower/Tub: Walk-in shower ?  ?Bathroom Toilet: Handicapped height ?  ?  ?Home Equipment: Rollator (4 wheels);Cane - single point;Shower seat ?  ?Additional Comments: pt reports "far" distance from room to dining hall at facility ?  ? ?  ?Prior Functioning/Environment Prior Level of Function :  Independent/Modified Independent ?  ?  ?  ?  ?  ?  ?Mobility Comments: Uses rollator to carry oxygen, uses SPC for community. Drives. Does IADLs. ?ADLs Comments: Independent ?  ? ?  ?  ?OT Problem List: [id number]

## 2021-10-05 NOTE — NC FL2 (Signed)
?[place] MEDICAID FL2 LEVEL OF CARE SCREENING TOOL  ?  ? ?IDENTIFICATION  ?Patient Name: ?Kathleen [name] Birthdate: 16-May-1946 Sex: female Admission Date (Current Location): ?10/04/2021  ?[place] and [place] Number: ? [place] ?  Facility and Address:  ?[facility] [place] [address], [place], [place] [address] ?     Provider Number: ?[id number]  ?Attending Physician Name and Address:  ?[place] Eric J, DO ? Relative Name and Phone Number:  ?  ?   ?Current Level of Care: ?Hospital Recommended Level of Care: ?[place] Prior Approval Number: ?  ? ?Date Approved/Denied: ?  PASRR Number: ?[id number] ? ?Discharge Plan: ?SNF ?  ? ?Current Diagnoses: ?Patient Active Problem List  ? Diagnosis Date Noted  ? Subarachnoid hemorrhage ([place]) 10/04/2021  ? Subarachnoid bleed ([place]) 10/04/2021  ? Gout 10/01/2021  ? Hyperlipidemia with target LDL less than 100 08/07/2021  ? Cough productive of purulent sputum 08/07/2021  ? LRTI (lower respiratory tract infection) 08/07/2021  ? Chronic anticoagulation 07/07/2021  ? Dysphagia 07/07/2021  ? Abnormal CT scan, colon 07/07/2021  ? History of colon polyps 07/07/2021  ? Stage 3a chronic kidney disease ([place]) 05/23/2021  ? Allergic contact dermatitis due to drugs in contact with skin 05/23/2021  ? Candidiasis of skin 05/23/2021  ? Dietary iron deficiency 05/23/2021  ? Age-related osteoporosis without current pathological fracture 04/22/2021  ? Type 2 diabetes mellitus with diabetic neuropathy, without long-term current use of insulin ([place]) 04/22/2021  ? Chronic idiopathic constipation 04/22/2021  ? Daytime somnolence 01/22/2021  ? Sleep apnea 01/22/2021  ? Constipation   ? (HFpEF) heart failure with preserved ejection fraction ([place]) 06/26/2019  ? Chronic systolic heart failure ([place]) 04/06/2018  ? HTN (hypertension) 04/06/2018  ? COPD (chronic obstructive pulmonary disease) ([place]) 04/06/2018  ? Atrial fibrillation ([place]) 04/06/2018  ? Chronic respiratory  failure with hypoxia ([place]) 03/28/2018  ? Malnutrition of moderate degree 09/11/2015  ? ? ?Orientation RESPIRATION BLADDER Height & Weight   ?  ?Self, Time, Situation, Place ? O2 (nasaul cannula 3 l/min) Continent Weight: 194 lb 0.1 oz (88 kg) ?Height:  5' (152.4 cm)  ?BEHAVIORAL SYMPTOMS/MOOD NEUROLOGICAL BOWEL NUTRITION STATUS  ?    Continent Diet (see DC summary)  ?AMBULATORY STATUS COMMUNICATION OF NEEDS Skin   ?Limited Assist Verbally Normal ?  ?  ?  ?    ?     ?     ? ? ?Personal Care Assistance Level of Assistance  ?Bathing, Feeding, Dressing Bathing Assistance: Limited assistance ?Feeding assistance: Independent ?Dressing Assistance: Limited assistance ?   ? ?Functional Limitations Info  ?    ?  ?   ? ? ?SPECIAL CARE FACTORS FREQUENCY  ?PT (By licensed PT), OT (By licensed OT)   ?  ?PT Frequency: 5x weekly ?OT Frequency: 5x weekly ?  ?  ?  ?   ? ? ?Contractures Contractures Info: Not present  ? ? ?Additional Factors Info  ?Allergies, Code Status Code Status Info: full ?Allergies Info: Allopurinol  Clindamycin  Pneumococcal 13-val Conj Vacc  Dronedarone  Brovana (arformoterol)  Budesonide  Entresto (sacubitril-valsartan)  Fosamax (alendronate Sodium)  Jardiance (empagliflozin)  Meperidine  Microplegia Msa-msg (plegisol)  Rosuvastatin  Tetracycline  Adhesive (tape)  Lovastatin ?  ?  ?  ?   ? ?Current Medications (10/05/2021):  This is the current hospital active medication list ?Current Facility-Administered Medications  ?Medication Dose Route Frequency Provider Last Rate Last Admin  ? acetaminophen (TYLENOL) tablet 650 mg  650 mg  Oral Q6H PRN Rise Patience, MD   650 mg at 10/05/21 [id number]  ? Or  ? acetaminophen (TYLENOL) suppository 650 mg  650 mg Rectal Q6H PRN Rise Patience, MD      ? albuterol (PROVENTIL) (2.5 MG/3ML) 0.083% nebulizer solution 2.5 mg  2.5 mg Inhalation Q4H PRN Rise Patience, MD      ? Bempedoic Acid TABS 1 tablet  1 tablet Oral Daily Rise Patience, MD      ?  cholecalciferol (VITAMIN D3) tablet 2,000 Units  2,000 Units Oral QHS Rise Patience, MD      ? docusate sodium (COLACE) capsule 100 mg  100 mg Oral BID Rise Patience, MD   100 mg at 10/05/21 [id number]  ? dofetilide (TIKOSYN) capsule 250 mcg  250 mcg Oral BID Rise Patience, MD   250 mcg at 10/05/21 [id number]  ? hydrALAZINE (APRESOLINE) injection 10 mg  10 mg Intravenous Q4H PRN Rise Patience, MD      ? levothyroxine (SYNTHROID) tablet 50 mcg  50 mcg Oral Q0600 Rise Patience, MD   50 mcg at 10/05/21 [id number]  ? losartan (COZAAR) tablet 25 mg  25 mg Oral QHS Rise Patience, MD      ? magnesium oxide (MAG-OX) tablet 400 mg  400 mg Oral QHS Rise Patience, MD      ? MEDLINE mouth rinse  15 mL Mouth Rinse BID [place] Donnamarie Poag, DO      ? revefenacin (YUPELRI) nebulizer solution 175 mcg  175 mcg Nebulization Daily Rise Patience, MD      ? spironolactone (ALDACTONE) tablet 25 mg  25 mg Oral q morning Rise Patience, MD   25 mg at 10/05/21 [id number]  ? ? ? ?Discharge Medications: ?Please see discharge summary for a list of discharge medications. ? ?Relevant Imaging Results: ? ?Relevant Lab Results: ? ? ?Additional Information ?SSn: [phone]. [place] COVID-19 Vaccine 10/09/2020 , 08/08/2019 , 07/13/2019. On Tikosyn ? ?Alfredia Ferguson, LCSW ? ? ? ? ?

## 2021-10-05 NOTE — Progress Notes (Signed)
Transition of Care (TOC) - CAGE-AID Screening ? ? ?Patient Details  ?Name: Kathleen [name] ?MRN: [id number] ?Date of Birth: 14-Sep-1945 ? ?Transition of Care (TOC) CM/SW Contact:    ?Clovis Cao, RN ?Phone Number: [phone] ?10/05/2021, 6:08 PM ? ? ?Clinical Narrative: ?Pt denies alcohol or drug use. ? ? ?CAGE-AID Screening: ?  ? ?Have You Ever Felt You Ought to Cut Down on Your Drinking or Drug Use?: No ?Have People Annoyed You By Critizing Your Drinking Or Drug Use?: No ?Have You Felt Bad Or Guilty About Your Drinking Or Drug Use?: No ?Have You Ever Had a Drink or Used Drugs First Thing In The Morning to Steady Your Nerves or to Get Rid of a Hangover?: No ?CAGE-AID Score: 0 ? ?Substance Abuse Education Offered: No ? ?  ? ? ? ? ? ? ?

## 2021-10-06 ENCOUNTER — Other Ambulatory Visit: Payer: Self-pay | Admitting: Internal Medicine

## 2021-10-06 ENCOUNTER — Observation Stay (HOSPITAL_COMMUNITY): Payer: Medicare Other

## 2021-10-06 DIAGNOSIS — I480 Paroxysmal atrial fibrillation: Secondary | ICD-10-CM | POA: Diagnosis not present

## 2021-10-06 DIAGNOSIS — I10 Essential (primary) hypertension: Secondary | ICD-10-CM | POA: Diagnosis not present

## 2021-10-06 DIAGNOSIS — Z7901 Long term (current) use of anticoagulants: Secondary | ICD-10-CM | POA: Diagnosis not present

## 2021-10-06 DIAGNOSIS — M109 Gout, unspecified: Secondary | ICD-10-CM | POA: Diagnosis present

## 2021-10-06 DIAGNOSIS — Z9981 Dependence on supplemental oxygen: Secondary | ICD-10-CM | POA: Diagnosis not present

## 2021-10-06 DIAGNOSIS — I5032 Chronic diastolic (congestive) heart failure: Secondary | ICD-10-CM | POA: Diagnosis present

## 2021-10-06 DIAGNOSIS — I130 Hypertensive heart and chronic kidney disease with heart failure and stage 1 through stage 4 chronic kidney disease, or unspecified chronic kidney disease: Secondary | ICD-10-CM | POA: Diagnosis present

## 2021-10-06 DIAGNOSIS — K5732 Diverticulitis of large intestine without perforation or abscess without bleeding: Secondary | ICD-10-CM | POA: Diagnosis not present

## 2021-10-06 DIAGNOSIS — J9611 Chronic respiratory failure with hypoxia: Secondary | ICD-10-CM | POA: Diagnosis present

## 2021-10-06 DIAGNOSIS — G4733 Obstructive sleep apnea (adult) (pediatric): Secondary | ICD-10-CM | POA: Diagnosis present

## 2021-10-06 DIAGNOSIS — I428 Other cardiomyopathies: Secondary | ICD-10-CM | POA: Diagnosis present

## 2021-10-06 DIAGNOSIS — S066X0A Traumatic subarachnoid hemorrhage without loss of consciousness, initial encounter: Secondary | ICD-10-CM | POA: Diagnosis present

## 2021-10-06 DIAGNOSIS — I4811 Longstanding persistent atrial fibrillation: Secondary | ICD-10-CM | POA: Diagnosis present

## 2021-10-06 DIAGNOSIS — I484 Atypical atrial flutter: Secondary | ICD-10-CM | POA: Diagnosis present

## 2021-10-06 DIAGNOSIS — E039 Hypothyroidism, unspecified: Secondary | ICD-10-CM | POA: Diagnosis present

## 2021-10-06 DIAGNOSIS — M4807 Spinal stenosis, lumbosacral region: Secondary | ICD-10-CM | POA: Diagnosis present

## 2021-10-06 DIAGNOSIS — W010XXA Fall on same level from slipping, tripping and stumbling without subsequent striking against object, initial encounter: Secondary | ICD-10-CM | POA: Diagnosis present

## 2021-10-06 DIAGNOSIS — R402252 Coma scale, best verbal response, oriented, at arrival to emergency department: Secondary | ICD-10-CM | POA: Diagnosis present

## 2021-10-06 DIAGNOSIS — M48061 Spinal stenosis, lumbar region without neurogenic claudication: Secondary | ICD-10-CM | POA: Diagnosis present

## 2021-10-06 DIAGNOSIS — E669 Obesity, unspecified: Secondary | ICD-10-CM | POA: Diagnosis present

## 2021-10-06 DIAGNOSIS — Z6837 Body mass index (BMI) 37.0-37.9, adult: Secondary | ICD-10-CM | POA: Diagnosis not present

## 2021-10-06 DIAGNOSIS — R402362 Coma scale, best motor response, obeys commands, at arrival to emergency department: Secondary | ICD-10-CM | POA: Diagnosis present

## 2021-10-06 DIAGNOSIS — N189 Chronic kidney disease, unspecified: Secondary | ICD-10-CM | POA: Diagnosis present

## 2021-10-06 DIAGNOSIS — I609 Nontraumatic subarachnoid hemorrhage, unspecified: Secondary | ICD-10-CM | POA: Diagnosis present

## 2021-10-06 DIAGNOSIS — R402142 Coma scale, eyes open, spontaneous, at arrival to emergency department: Secondary | ICD-10-CM | POA: Diagnosis present

## 2021-10-06 DIAGNOSIS — E785 Hyperlipidemia, unspecified: Secondary | ICD-10-CM | POA: Diagnosis present

## 2021-10-06 DIAGNOSIS — E876 Hypokalemia: Secondary | ICD-10-CM | POA: Diagnosis not present

## 2021-10-06 DIAGNOSIS — J410 Simple chronic bronchitis: Secondary | ICD-10-CM | POA: Diagnosis not present

## 2021-10-06 MED ORDER — LORAZEPAM 2 MG/ML IJ SOLN: 1.0000 mg | INTRAMUSCULAR | Status: AC

## 2021-10-06 MED ORDER — AMOXICILLIN-POT CLAVULANATE 875-125 MG PO TABS
1.0000 | ORAL_TABLET | Freq: Two times a day (BID) | ORAL | Status: DC
  Administered 2021-10-06 – 2021-10-08 (×6): 1 via ORAL
  Filled 2021-10-06 (×7): qty 1

## 2021-10-06 NOTE — Progress Notes (Signed)
Mobility Specialist: Progress Note ? ? 10/06/21 1538  ?Mobility  ?Activity Ambulated with assistance in hallway  ?Level of Assistance Minimal assist, patient does 75% or more  ?Assistive Device Front wheel walker  ?Distance Ambulated (ft) 90 ft  ?Activity Response Tolerated well  ?$Mobility charge 1 Mobility  ? ?Pre-Mobility:  ?   Supine: 71 HR, 87/54 (59) BP, 97% SpO2 ?   Seated EOB: 128/73 (91) BP ?Post-Mobility: 72 HR, 138/56 (78) BP, 98% SpO2 ? ?Pt received in bed and agreeable to ambulation. Ambulated on 4 L/min [place]. C/o R hip and L knee pain as well as dizziness, BP as seen above. C/o 8/10 R hip and 5/10 L knee pain during ambulation. After returning to EOB pt c/o feeling dizzy upon sitting, resolved after returning to supine. Call bell and phone are in reach. Bed alarm is on.  ? ?Harrell Gave Jobeth Pangilinan ?Mobility Specialist ?Mobility Specialist [place]: [phone] ?Mobility Specialist [place]: [phone] ? ?

## 2021-10-06 NOTE — TOC CAGE-AID Note (Signed)
Transition of Care (TOC) - CAGE-AID Screening ? ? ?Patient Details  ?Name: Kathleen [name] ?MRN: [id number] ?Date of Birth: [birth year] ? ?Transition of Care (TOC) CM/SW Contact:    ?Coralee Pesa, LCSWA ?Phone Number: ?10/06/2021, 12:12 PM ? ? ?Clinical Narrative: ?RN completed assessment, no needs noted, pt scored 0 on CAGE- AID. ? ? ?CAGE-AID Screening: ?  ? ?Have You Ever Felt You Ought to Cut Down on Your Drinking or Drug Use?: No ?Have People Annoyed You By Critizing Your Drinking Or Drug Use?: No ?Have You Felt Bad Or Guilty About Your Drinking Or Drug Use?: No ?Have You Ever Had a Drink or Used Drugs First Thing In The Morning to Steady Your Nerves or to Get Rid of a Hangover?: No ?CAGE-AID Score: 0 ? ?Substance Abuse Education Offered: No ? ?  ? ? ? ? ? ? ?

## 2021-10-06 NOTE — Progress Notes (Signed)
Physical Therapy Treatment ?Patient Details ?Name: Kathleen [name] ?MRN: [id number] ?DOB: June 28, 1945 ?Today's Date: 10/06/2021 ? ? ?History of Present Illness The pt is a 76 yo female presenting 4/29 after a fall at her independent living resulting in injury to the back of her head. CT revealed: subarachnoid hemorrhage, no other acute injury or abnormalities. PMH includes: 3L chronic oxygen dependence, pacemaker, A-fib, CHF, asthma/COPD, OSA and OHS. ? ?  ?PT Comments  ? ? Pt in pain this AM, relays head pain and dizziness with mvmt. Pt did have dizziness with supine>sit but resolved once sitting up. Pt transferred bed<>BSC with min A +2, crying out in pain throughout. Pt did not feel that she could ambulate today due to pain and transport arrived to take her to CT. Changing d/c rec to SNF as pt will need more assist based on session today. PT will continue to follow. ?   ?Recommendations for follow up therapy are one component of a multi-disciplinary discharge planning process, led by the attending physician.  Recommendations may be updated based on patient status, additional functional criteria and insurance authorization. ? ?Follow Up Recommendations ? Skilled nursing-short term rehab (<3 hours/day) ?  ?  ?Assistance Recommended at Discharge Frequent or constant Supervision/Assistance  ?Patient can return home with the following Assistance with cooking/housework;Assist for transportation;Help with stairs or ramp for entrance;A lot of help with walking and/or transfers;A lot of help with bathing/dressing/bathroom ?  ?Equipment Recommendations ? Rolling walker (2 wheels) (shower seat)  ?  ?Recommendations for Other Services   ? ? ?  ?Precautions / Restrictions Precautions ?Precautions: Fall ?Restrictions ?Weight Bearing Restrictions: No  ?  ? ?Mobility ? Bed Mobility ?Overal bed mobility: Needs Assistance ?Bed Mobility: Supine to Sit ?  ?  ?Supine to sit: Mod assist ?  ?  ?General bed mobility comments: pt needed mod A  to initiate moving to R side of bed and elevate trunk into sitting. Increased time needed and cues for continuously breathing ?  ? ?Transfers ?Overall transfer level: Needs assistance ?Equipment used: Rolling walker (2 wheels) ?Transfers: Sit to/from Stand, Bed to chair/wheelchair/BSC ?Sit to Stand: Min assist, +2 safety/equipment ?Stand pivot transfers: Min assist, +2 safety/equipment ?  ?  ?  ?  ?General transfer comment: pt very anxious about mobility. RN present for +2 for safety and to encourage pt. Pt took pivot steps to [facility], crying out in pain on the way. Sit to stand from [facility] with min A. Pivoted back to bed with min A +2. ?  ? ?Ambulation/Gait ?  ?  ?  ?  ?  ?  ?  ?General Gait Details: transport arrived to take pt to CT and pt reported she had too much pain to ambulate ? ? ?Stairs ?  ?  ?  ?  ?  ? ? ?Wheelchair Mobility ?  ? ?Modified Rankin (Stroke Patients Only) ?  ? ? ?  ?Balance Overall balance assessment: Needs assistance ?Sitting-balance support: No upper extremity supported, Feet supported ?Sitting balance-Leahy Scale: Fair ?  ?  ?Standing balance support: Bilateral upper extremity supported, During functional activity ?Standing balance-Leahy Scale: Poor ?Standing balance comment: reliant on UE and external support ?  ?  ?  ?  ?  ?  ?  ?  ?  ?  ?  ?  ? ?  ?Cognition Arousal/Alertness: Awake/alert ?Behavior During Therapy: Anxious ?Overall Cognitive Status: Within Functional Limits for tasks assessed ?  ?  ?  ?  ?  ?  ?  ?  ?  ?  ?  ?  ?  ?  ?  ?  ?  General Comments: pt very anxious with mvmt and fearful of falling ?  ?  ? ?  ?Exercises   ? ?  ?General Comments General comments (skin integrity, edema, etc.): dizziness with rolling in bed and coming to sitting but pt denied after that. Was able to urinate on [facility] ?  ?  ? ?Pertinent Vitals/Pain Pain Assessment ?Pain Assessment: Faces ?Faces Pain Scale: Hurts whole lot ?Pain Location: head ?Pain Descriptors / Indicators: Discomfort ?Pain Intervention(s):  Limited activity within patient's tolerance, Monitored during session  ? ? ?Home Living   ?  ?  ?  ?  ?  ?  ?  ?  ?  ?   ?  ?Prior Function    ?  ?  ?   ? ?PT Goals (current goals can now be found in the care plan section) Acute Rehab PT Goals ?Patient Stated Goal: to reduce pain, maintain independence ?PT Goal Formulation: With patient ?Time For Goal Achievement: 10/19/21 ?Potential to Achieve Goals: Good ?Progress towards PT goals: Not progressing toward goals - comment (pain and anxiety) ? ?  ?Frequency ? ? ? Min 4X/week ? ? ? ?  ?PT Plan Discharge plan needs to be updated  ? ? ?Co-evaluation   ?  ?  ?  ?  ? ?  ?AM-PAC PT "6 Clicks" Mobility   ?Outcome Measure ? Help needed turning from your back to your side while in a flat bed without using bedrails?: None ?Help needed moving from lying on your back to sitting on the side of a flat bed without using bedrails?: A Lot ?Help needed moving to and from a bed to a chair (including a wheelchair)?: A Lot ?Help needed standing up from a chair using your arms (e.g., wheelchair or bedside chair)?: A Lot ?Help needed to walk in hospital room?: A Lot ?Help needed climbing 3-5 steps with a railing? : Total ?6 Click Score: 13 ? ?  ?End of Session Equipment Utilized During Treatment: Oxygen ?Activity Tolerance: Patient limited by pain ?Patient left: with call bell/phone within reach;in bed;with nursing/sitter in room ?Nurse Communication: Mobility status ?PT Visit Diagnosis: Other abnormalities of gait and mobility (R26.89);Muscle weakness (generalized) (M62.81);Pain ?Pain - part of body:  (head) ?  ? ? ?Time: [id number] ?PT Time Calculation (min) (ACUTE ONLY): 17 min ? ?Charges:  $Therapeutic Activity: 8-22 mins          ?          ? ?Leighton Roach, PT  ?Acute Rehab Services ? Pager [phone] ?Office [phone] ? ? ? ?[place] ?10/06/2021, 1:13 PM ? ?

## 2021-10-06 NOTE — Progress Notes (Addendum)
?PROGRESS NOTE ? ? ? ?Kathleen [name]  [id number] DOB: [birth year] DOA: 10/04/2021 ?PCP: [name], MD  ? ? ?Brief Narrative:  ? 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  No reported loss of consciousness.  Patient states she missed her rolling walker and fell to the floor.  EMS was called and brought to the ED for further evaluation. ? ?In the ED, temperature 97.9 ?F, HR 74, RR 16, BP 130/66, SPO2 100% on room air.  Sodium 136, potassium 3.9, chloride 101, CO2 25, glucose 127, BUN 20, creatinine 0.89.  WBC 12.7, hemoglobin 13.2, platelets 249.  INR 1.4.  CT head without contrast with small foci of subarachnoid hemorrhage in the left parietal lobe, no epidural/subdural hematoma, noted atrophy and subcutaneous hematoma right parietal scalp.  CT C-spine with no recent fracture seen, minimal anterior listhesis C4-5 suggestive of previous ligamentous injury and facet degeneration.  Neurosurgery was consulted and recommended medical admission for observation with repeat imaging.  Did not receive reversal of Xarelto. ? ?Assessment & Plan: ?  ?Subarachnoid hemorrhage, traumatic ?Patient sustaining a mechanical fall striking the posterior aspect of her head after missing her rolling walker.  No loss of consciousness.  CT head without contrast with small foci of subarachnoid hemorrhage in the left parietal lobe with no epidural/subdural hematoma.  CT C-spine with no recent fracture seen.  Was evaluated by neurosurgery with recommendation of medical admission with further observation and repeat imaging.  Repeat CT head 4/30 shows previous foci of hyperdensity no longer seen. ?--Continue to  hold Xarelto for 72 hours; likely restart 5/2 ? ?Acute sigmoid diverticulitis  ?CT pelvis without contrast 5/1 with wall thickening and pericolic stranding sigmoid suggestive of acute sigmoid diverticulitis.  Tolerating diet without abdominal symptoms. ?--Augmentin 875-125 mg p.o. twice daily x10 days ? ?Chronic diastolic congestive heart failure ?Essential hypertension ?--Losartan 25 mg p.o. daily ?--Spironolactone 25 mg p.o. daily ?--holding home Torsemide 20 mg Monday/Friday ? ?Atrial fibrillation on chronic anticoagulation ?--Tikosyn 250 mg twice daily ?--Holding Xarelto as above for 72 hours ? ?Chronic hypoxic respiratory failure ?Hx BOOP ?--Stable on home 4 L nasal cannula ?--Maretta Bees neb daily ?--albuterol PRN ? ?Hypothyroidism ?--Levothyroxine 50 mcg p.o. daily ? ?OSA: Continue nocturnal CPAP ? ?Morbid obesity ?Body mass index is 37.89 kg/m?[name]  Discussed with patient needs for aggressive lifestyle changes/weight loss as this complicates all facets of care.  Outpatient follow-up with PCP. ? ?Physical deconditioning ?Gait disturbance ?Right lower extremity weakness/paresthesias ?L-spine spine and bilateral hip x-rays negative for fracture.  Patient continues with difficulty ambulating following a fall.  Seen by PT/OT with recommendation of SNF placement as patient does not have family support at home and would have difficulty navigating stairs and to get to her bathroom.  CT pelvis without contrast with degenerative changes facet joints lower lumbar spine, minimal anterolisthesis L4-L5, significant spinal stenosis L4-L5 no fractures noted and bony structures. ?--MR L-spine: Pending ?--TOC for SNF placement ? ?DVT prophylaxis: SCDs Start: 10/04/21 2041 ? ?  Code Status: Full Code ?Family Communication: no family present at bedside this am ? ?Disposition Plan:  ?Level of care: Progressive ?Status is: Observation ?The patient will require care spanning > 2  midnights and should be moved to inpatient because: Acute  diverticulitis noted on CT pelvis, weakness, paresthesias concerning for radiculopathy with recent trauma/fall pending MR L-spine.  We will also likely need SNF placement due to ambulatory dysfunction/gait disturbance; and need for aggressive rehabilitation ?  ? ?Consultants:  ?Neurosurgery ? ?Procedures:  ?None ? ?Antimicrobials:  ?None ? ? ?Subjective: ?Seen at bedside, resting comfortably.  Neurosurgery NP present.  RN present.  Patient now reports weakness, pain and paresthesias starting at her lumbar region radiating down her right lower extremity.  Patient states unable to ambulate.  Discussed will obtain MR L-spine; states she has tolerated MRIs in the past without issue.  Pending SNF placement.  No other questions or concerns at this time.  Denies headache, no visual changes, no fever/chills/night sweats, no nausea/vomiting/diarrhea, no chest pain, no palpitations, no shortness of breath, no abdominal pain, no focal weakness, no fatigue, no paresthesias.  No acute events overnight per nurse staff. ? ?Objective: ?Vitals:  ? 10/05/21 2332 10/06/21 0401 10/06/21 0756 10/06/21 0807  ?BP:  (!) 129/58    ?Pulse: 75 73    ?Resp: 18 20    ?Temp:  97.7 ?F (36.5 ?C)  98.5 ?F (36.9 ?C)  ?TempSrc:  Oral  Oral  ?SpO2: 97% 92% 97%   ?Weight:      ?Height:      ? ? ?Intake/Output Summary (Last 24 hours) at 10/06/2021 1028 ?Last data filed at 10/06/2021 0700 ?Gross per 24 hour  ?Intake 240 ml  ?Output --  ?Net 240 ml  ? ?Filed Weights  ? 10/04/21 1740 10/04/21 1826  ?Weight: 88 kg 88 kg  ? ? ?Examination: ? ?Physical Exam: ?GEN: NAD, alert and oriented x 3, obese ?HEENT: NCAT, PERRL, EOMI, sclera clear, MMM ?PULM: CTAB w/o wheezes/crackles, normal respiratory effort, on 4 L nasal cannula which is her baseline ?CV: RRR w/o M/G/R ?GI: abd soft, NTND, NABS, no R/G/M ?MSK: no peripheral edema, muscle strength globally intact 5/5 bilateral upper/lower extremities ?NEURO: CN II-XII intact, no focal deficits, sensation to light touch  intact ?PSYCH: normal mood/affect ?Integumentary: dry/intact, no rashes or wounds ? ? ? ?Data Reviewed: I have personally reviewed following labs and imaging studies ? ?CBC: ?Recent Labs  ?Lab 10/04/21 ?1735 10/05/21 ?0135  ?WBC 12.7* 10.3  ?NEUTROABS 10.4*  --   ?HGB 13.2 12.0  ?HCT 41.8 38.2  ?MCV 85.0 84.0  ?PLT 249 226  ? ?Basic Metabolic Panel: ?Recent Labs  ?Lab 10/04/21 ?1735 10/05/21 ?0135  ?NA 136 134*  ?K 3.9 4.0  ?CL 101 104  ?CO2 25 24  ?GLUCOSE 127* 136*  ?BUN 20 18  ?CREATININE 0.89 0.92  ?CALCIUM 9.2 9.0  ? ?GFR: ?Estimated Creatinine Clearance: 52.9 mL/min (by C-G formula based on SCr of 0.92 mg/dL). ?Liver Function Tests: ?Recent Labs  ?Lab 10/04/21 ?1735  ?AST 18  ?ALT 13  ?ALKPHOS 31*  ?BILITOT 0.7  ?PROT 6.2*  ?ALBUMIN 3.7  ? ?No results for input(s): LIPASE, AMYLASE in the last 168 hours. ?No results for input(s): AMMONIA in the last 168 hours. ?Coagulation Profile: ?Recent Labs  ?Lab 10/04/21 ?1735  ?INR 1.4*  ? ?Cardiac Enzymes: ?No results for input(s): CKTOTAL, CKMB, CKMBINDEX, TROPONINI in the last 168 hours. ?BNP (last 3 results) ?No results for input(s): PROBNP in the last 8760 hours. ?HbA1C: ?No results for input(s): HGBA1C in the last 72 hours. ?CBG: ?No results for input(s): GLUCAP in the last 168 hours. ?Lipid Profile: ?No results for input(s): CHOL, HDL, LDLCALC, TRIG,  CHOLHDL, LDLDIRECT in the last 72 hours. ?Thyroid Function Tests: ?No results for input(s): TSH, T4TOTAL, FREET4, T3FREE, THYROIDAB in the last 72 hours. ?Anemia Panel: ?No results for input(s): VITAMINB12, FOLATE, FERRITIN, TIBC, IRON, RETICCTPCT in the last 72 hours. ?Sepsis Labs: ?No results for input(s): PROCALCITON, LATICACIDVEN in the last 168 hours. ? ?No results found for this or any previous visit (from the past 240 hour(s)).  ? ? ? ? ? ?Radiology Studies: ?DG Lumbar Spine 2-3 Views ? ?Result Date: 10/05/2021 ?CLINICAL DATA:  76 year old female with right side low back pain. EXAM: LUMBAR SPINE - 2-3 VIEW  COMPARISON:  CT Abdomen and Pelvis 06/26/2019. FINDINGS: Stable cholecystectomy clips. Mild levoconvex lumbar scoliosis. Normal lumbar segmentation. Stable lumbar lordosis since 2021 with mild grade anterolis

## 2021-10-06 NOTE — TOC Progression Note (Signed)
Transition of Care (TOC) - Progression Note  ? ? ?Patient Details  ?Name: Kathleen [name] ?MRN: [id number] ?Date of Birth: 1945/07/12 ? ?Transition of Care (TOC) CM/SW Contact  ?Vinie Sill, LCSW ?Phone Number: ?10/06/2021, 2:30 PM ? ?Clinical Narrative:    ? ?CSW met with patient at bedside. She remains agreeable to short term rehab at [facility]. Patient states, preferred SNF  is [facility].  ? ?[place] confirmed availability- SNF states they are able to admit tomorrow.  ? ?TOC will continue to follow and assist with discharge planning. ? ? ? ?Expected Discharge Plan: [place] ?Barriers to Discharge: SNF Pending bed offer ? ?Expected Discharge Plan and Services ?Expected Discharge Plan: [place] ?  ?  ?Post Acute Care Choice: [place] ?  ?                ?  ?  ?  ?  ?  ?  ?  ?  ?  ?  ? ? ?Social Determinants of Health (SDOH) Interventions ?  ? ?Readmission Risk Interventions ? ?  06/28/2019  ?  3:42 PM  ?Readmission Risk Prevention Plan  ?Transportation Screening Complete  ?PCP or Specialist Appt within 3-5 Days Complete  ?[place] or Home Care Consult Complete  ?Palliative Care Screening Not Applicable  ?Medication Review Press photographer) Complete  ? ? ?

## 2021-10-06 NOTE — Progress Notes (Signed)
Subjective: ?Patient reports mild headaches at time. Her biggest complaint today is her buttuck and right leg pain with numbness and tingling. States it is very painful to stand because of this pain  ? ?Objective: ?Vital signs in last 24 hours: ?Temp:  [97.7 ?F (36.5 ?C)-98.5 ?F (36.9 ?C)] 98.5 ?F (36.9 ?C) (05/01 [id number]) ?Pulse Rate:  [69-78] 73 (05/01 0401) ?Resp:  [17-22] 20 (05/01 0401) ?BP: (108-129)/(46-84) 129/58 (05/01 0401) ?SpO2:  [92 %-97 %] 97 % (05/01 0756) ? ?Intake/Output from previous day: ?04/30 0701 - 05/01 0700 ?In: 240 [P.O.:240] ?Out: -  ?Intake/Output this shift: ?No intake/output data recorded. ? ?Neurologic: Grossly normal ? ?Lab Results: ?Lab Results  ?Component Value Date  ? WBC 10.3 10/05/2021  ? HGB 12.0 10/05/2021  ? HCT 38.2 10/05/2021  ? MCV 84.0 10/05/2021  ? PLT 226 10/05/2021  ? ?Lab Results  ?Component Value Date  ? INR 1.4 (H) 10/04/2021  ? ?BMET ?Lab Results  ?Component Value Date  ? NA 134 (L) 10/05/2021  ? K 4.0 10/05/2021  ? CL 104 10/05/2021  ? CO2 24 10/05/2021  ? GLUCOSE 136 (H) 10/05/2021  ? BUN 18 10/05/2021  ? CREATININE 0.92 10/05/2021  ? CALCIUM 9.0 10/05/2021  ? ? ?Studies/Results: ?DG Lumbar Spine 2-3 Views ? ?Result Date: 10/05/2021 ?CLINICAL DATA:  76 year old female with right side low back pain. EXAM: LUMBAR SPINE - 2-3 VIEW COMPARISON:  CT Abdomen and Pelvis 06/26/2019. FINDINGS: Stable cholecystectomy clips. Mild levoconvex lumbar scoliosis. Normal lumbar segmentation. Stable lumbar lordosis since 2021 with mild grade anterolisthesis of L4 on L5. Mild to moderate lower lumbar facet hypertrophy. Stable disc spaces. No acute osseous abnormality identified. Calcified aortic atherosclerosis. Partially visible chronic cardiac pacemaker. Negative visible lung bases and bowel gas. IMPRESSION: 1. No acute osseous abnormality identified in the lumbar spine. 2. Chronic grade 1 anterolisthesis of L4 on L5 with facet arthropathy. 3.  Aortic Atherosclerosis (ICD10-I70.0).  Electronically Signed   By: Genevie Ann M.D.   On: 10/05/2021 11:12  ? ?CT HEAD WO CONTRAST (5MM) ? ?Result Date: 10/05/2021 ?CLINICAL DATA:  Stroke, follow up EXAM: CT HEAD WITHOUT CONTRAST TECHNIQUE: Contiguous axial images were obtained from the base of the skull through the vertex without intravenous contrast. RADIATION DOSE REDUCTION: This exam was performed according to the departmental dose-optimization program which includes automated exposure control, adjustment of the mA and/or kV according to patient size and/or use of iterative reconstruction technique. COMPARISON:  10/04/2021 FINDINGS: Brain: Previously seen foci of hyperdensity are no longer identified. No new hemorrhage. No new loss of gray-white differentiation. Ventricles and sulci are stable in size and configuration. Vascular: No new finding. Skull: Calvarium is unremarkable. Sinuses/Orbits: No acute finding. Other: Posterior right scalp soft tissue swelling. IMPRESSION: Previously seen foci of hyperdensity are no longer identified. No new hemorrhage. Electronically Signed   By: Macy Mis M.D.   On: 10/05/2021 11:33  ? ?CT Head Wo Contrast ? ?Result Date: 10/04/2021 ?CLINICAL DATA:  Trauma, patient on anticoagulationregimen EXAM: CT HEAD WITHOUT CONTRAST TECHNIQUE: Contiguous axial images were obtained from the base of the skull through the vertex without intravenous contrast. RADIATION DOSE REDUCTION: This exam was performed according to the departmental dose-optimization program which includes automated exposure control, adjustment of the mA and/or kV according to patient size and/or use of iterative reconstruction technique. COMPARISON:  04/15/2021 FINDINGS: Brain: There are small foci of increased density in the left parietal cortex. This finding may suggest small foci of subarachnoid hemorrhage. There is no epidural  or subdural fluid collection. There is no demonstrable blood within the ventricles. There is no shift of midline structures.  Cortical sulci are prominent. Vascular: Unremarkable. Skull: No fracture is seen in the calvarium. There is subcutaneous hematoma in the right parietal scalp. Sinuses/Orbits: There is mild mucosal thickening in the ethmoid and right maxillary sinuses. There is possible air-fluid level in the left side of sphenoid sinus. Other: None IMPRESSION: There are small foci of subarachnoid hemorrhage in the left parietal lobe. There is no epidural or subdural hematoma. Atrophy. There is subcutaneous hematoma in the right parietal scalp. Sinusitis. Imaging findings were relayed to Dr. Dina Rich by telephone call. Electronically Signed   By: Elmer Picker M.D.   On: 10/04/2021 17:36  ? ?CT Cervical Spine Wo Contrast ? ?Result Date: 10/04/2021 ?CLINICAL DATA:  Trauma EXAM: CT CERVICAL SPINE WITHOUT CONTRAST TECHNIQUE: Multidetector CT imaging of the cervical spine was performed without intravenous contrast. Multiplanar CT image reconstructions were also generated. RADIATION DOSE REDUCTION: This exam was performed according to the departmental dose-optimization program which includes automated exposure control, adjustment of the mA and/or kV according to patient size and/or use of iterative reconstruction technique. COMPARISON:  None. FINDINGS: Alignment: There is minimal 1-2 mm anterolisthesis at C4-C5 level. This may be due to previous ligament injury and facet degeneration. Skull base and vertebrae: No recent fracture is seen. Tiny smooth marginated calcifications adjacent to the anterior inferior aspects of bodies of C4 and C5 vertebrae may suggest ununited accessory ossification centers. Soft tissues and spinal canal: There is no central spinal stenosis. Disc levels: There is mild encroachment of neural foramina at C3-C4 and C4-C5 levels. Upper chest: Centrilobular emphysema is seen. Other: There is inhomogeneous attenuation in the thyroid. IMPRESSION: No recent fracture is seen in the cervical spine. Minimal  anterolisthesis at C4-C5 level may suggest previous ligament injury and facet degeneration. COPD.  There is inhomogeneous attenuation in the thyroid. Electronically Signed   By: Elmer Picker M.D.   On: 10/04/2021 17:40  ? ?DG HIPS BILAT WITH PELVIS 3-4 VIEWS ? ?Result Date: 10/05/2021 ?CLINICAL DATA:  76 year old female with severe right side pain. EXAM: DG HIP (WITH OR WITHOUT PELVIS) 3-4V BILAT COMPARISON:  Lumbar radiographs today. CT Abdomen and Pelvis 06/26/2019. FINDINGS: Femoral heads are normally located. Hip joint spaces appear symmetric and stable since 2021, relatively normal for age. No pelvis fracture identified. Grossly intact proximal femurs. Negative visible bowel gas. Left lower quadrant surgical clip is in the on the prior CT. Greater omentum IMPRESSION: No acute osseous abnormality identified. Hip joint spaces within normal limits for age. Electronically Signed   By: Genevie Ann M.D.   On: 10/05/2021 11:13   ? ?Assessment/Plan: ?S/p fall with small SAH that has resolved. MRI ordered of lumbar spine to rule out radiculopathy. Ok to d/c home from nsgy standpoint if able to ambulate. Will review lumbar mri once done. ? ? LOS: 0 days  ? ? ?Ocie Cornfield Aleksa Collinsworth ?10/06/2021, 10:00 AM ? ? ?  ?

## 2021-10-07 ENCOUNTER — Inpatient Hospital Stay (HOSPITAL_COMMUNITY): Payer: Medicare Other

## 2021-10-07 DIAGNOSIS — I609 Nontraumatic subarachnoid hemorrhage, unspecified: Secondary | ICD-10-CM | POA: Diagnosis not present

## 2021-10-07 LAB — BASIC METABOLIC PANEL
Anion gap: 9 (ref 5–15)
BUN: 13 mg/dL (ref 8–23)
CO2: 22 mmol/L (ref 22–32)
Calcium: 9.3 mg/dL (ref 8.9–10.3)
Chloride: 103 mmol/L (ref 98–111)
Creatinine, Ser: 0.79 mg/dL (ref 0.44–1.00)
GFR, Estimated: >60 mL/min (ref >60)
Glucose, Bld: 188 mg/dL — ABNORMAL HIGH (ref 70–99)
Potassium: 3.3 mmol/L — ABNORMAL LOW (ref 3.5–5.1)
Sodium: 134 mmol/L — ABNORMAL LOW (ref 135–145)

## 2021-10-07 LAB — CBC
HCT: 35.8 % — ABNORMAL LOW (ref 36.0–46.0)
Hemoglobin: 11.2 g/dL — ABNORMAL LOW (ref 12.0–15.0)
MCH: 26.2 pg (ref 26.0–34.0)
MCHC: 31.3 g/dL (ref 30.0–36.0)
MCV: 83.6 fL (ref 80.0–100.0)
Platelets: 194 10*3/uL (ref 150–400)
RBC: 4.28 MIL/uL (ref 3.87–5.11)
RDW: 14.8 % (ref 11.5–15.5)
WBC: 8.6 10*3/uL (ref 4.0–10.5)
nRBC: 0 % (ref 0.0–0.2)

## 2021-10-07 LAB — MAGNESIUM: Magnesium: 1.9 mg/dL (ref 1.7–2.4)

## 2021-10-07 MED ORDER — POTASSIUM CHLORIDE CRYS ER 20 MEQ PO TBCR
30.0000 meq | EXTENDED_RELEASE_TABLET | ORAL | Status: AC
  Administered 2021-10-07 (×2): 30 meq via ORAL
  Filled 2021-10-07 (×2): qty 1

## 2021-10-07 MED ORDER — LORAZEPAM 2 MG/ML IJ SOLN
INTRAMUSCULAR | Status: DC
Start: 2021-10-07 — End: 2021-10-07
  Filled 2021-10-07: qty 1

## 2021-10-07 MED ORDER — ONDANSETRON HCL 4 MG/2ML IJ SOLN
4.0000 mg | Freq: Four times a day (QID) | INTRAMUSCULAR | Status: DC | PRN
  Administered 2021-10-07: 4 mg via INTRAVENOUS
  Filled 2021-10-07: qty 2

## 2021-10-07 MED ORDER — TRAMADOL HCL 50 MG PO TABS
50.0000 mg | ORAL_TABLET | Freq: Three times a day (TID) | ORAL | Status: DC | PRN
  Administered 2021-10-07 – 2021-10-08 (×4): 50 mg via ORAL
  Filled 2021-10-07 (×4): qty 1

## 2021-10-07 NOTE — Progress Notes (Signed)
Mobility Specialist: Progress Note ? ? 10/07/21 1633  ?Mobility  ?Activity Ambulated with assistance in hallway  ?Level of Assistance Minimal assist, patient does 75% or more  ?Assistive Device Front wheel walker  ?Distance Ambulated (ft) 170 ft  ?Activity Response Tolerated well  ?$Mobility charge 1 Mobility  ? ?Pt received in bed and agreeable to ambulation. Ambulated on 4 L/min [place]. To BR per request, void successful, then hallway ambulation. No c/o throughout. Pt to the recliner after session per request with call bell and phone in reach.  ? ?Harrell Gave Jereld Presti ?Mobility Specialist ?Mobility Specialist [place]: [phone] ?Mobility Specialist [place]: [phone] ? ?

## 2021-10-07 NOTE — TOC Progression Note (Signed)
Transition of Care (TOC) - Progression Note  ? ? ?Patient Details  ?Name: Kathleen [name] ?MRN: [id number] ?Date of Birth: 1945-11-06 ? ?Transition of Care (TOC) CM/SW Contact  ?Eduard Roux, LCSW ?Phone Number: ?10/07/2021, 4:12 PM ? ?Clinical Narrative:    ? ?SNF informed CSW too late to discharge today. If medically stable will plan for d/c tomorrow. ? ?Antony Blackbird, MSW, LCSW ?Clinical Social Worker ? ? ? ?Expected Discharge Plan: Skilled Nursing Facility ?Barriers to Discharge: SNF Pending bed offer ? ?Expected Discharge Plan and Services ?Expected Discharge Plan: Skilled Nursing Facility ?  ?  ?Post Acute Care Choice: Skilled Nursing Facility ?  ?                ?  ?  ?  ?  ?  ?  ?  ?  ?  ?  ? ? ?Social Determinants of Health (SDOH) Interventions ?  ? ?Readmission Risk Interventions ? ?  06/28/2019  ?  3:42 PM  ?Readmission Risk Prevention Plan  ?Transportation Screening Complete  ?PCP or Specialist Appt within 3-5 Days Complete  ?HRI or Home Care Consult Complete  ?Palliative Care Screening Not Applicable  ?Medication Review Oceanographer) Complete  ? ? ?

## 2021-10-07 NOTE — Progress Notes (Signed)
Patient refuses BiPap for HS due to dissatisfaction with the hospital provided mask.  Patient states that she will bring her mask from home to use for her BiPap tomorrow night.  Patient advised that RT will be available to help her if she changes her mind or has questions or concerns.  ?

## 2021-10-07 NOTE — Progress Notes (Signed)
Subjective: ?Patient reports still having back and buttocks pain. Her biggest complaint is her right hip. She is unable to sit up in a chair bc of the pain  ? ?Objective: ?Vital signs in last 24 hours: ?Temp:  [98.1 ?F (36.7 ?C)-98.9 ?F (37.2 ?C)] 98.1 ?F (36.7 ?C) (05/02 [id number]) ?Pulse Rate:  [69-73] 70 (05/02 0338) ?Resp:  [9-19] 18 (05/02 0338) ?BP: (99-159)/(52-99) 114/52 (05/02 [id number]) ?SpO2:  [91 %-98 %] 93 % (05/02 0338) ? ?Intake/Output from previous day: ?05/01 0701 - 05/02 0700 ?In: 1200 [P.O.:1200] ?Out: -  ?Intake/Output this shift: ?No intake/output data recorded. ? ?Neurologic: Grossly normal ? ?Lab Results: ?Lab Results  ?Component Value Date  ? WBC 8.6 10/07/2021  ? HGB 11.2 (L) 10/07/2021  ? HCT 35.8 (L) 10/07/2021  ? MCV 83.6 10/07/2021  ? PLT 194 10/07/2021  ? ?Lab Results  ?Component Value Date  ? INR 1.4 (H) 10/04/2021  ? ?BMET ?Lab Results  ?Component Value Date  ? NA 134 (L) 10/07/2021  ? K 3.3 (L) 10/07/2021  ? CL 103 10/07/2021  ? CO2 22 10/07/2021  ? GLUCOSE 188 (H) 10/07/2021  ? BUN 13 10/07/2021  ? CREATININE 0.79 10/07/2021  ? CALCIUM 9.3 10/07/2021  ? ? ?Studies/Results: ?DG Lumbar Spine 2-3 Views ? ?Result Date: 10/05/2021 ?CLINICAL DATA:  76 year old female with right side low back pain. EXAM: LUMBAR SPINE - 2-3 VIEW COMPARISON:  CT Abdomen and Pelvis 06/26/2019. FINDINGS: Stable cholecystectomy clips. Mild levoconvex lumbar scoliosis. Normal lumbar segmentation. Stable lumbar lordosis since 2021 with mild grade anterolisthesis of L4 on L5. Mild to moderate lower lumbar facet hypertrophy. Stable disc spaces. No acute osseous abnormality identified. Calcified aortic atherosclerosis. Partially visible chronic cardiac pacemaker. Negative visible lung bases and bowel gas. IMPRESSION: 1. No acute osseous abnormality identified in the lumbar spine. 2. Chronic grade 1 anterolisthesis of L4 on L5 with facet arthropathy. 3.  Aortic Atherosclerosis (ICD10-I70.0). Electronically Signed   By: Genevie Ann  M.D.   On: 10/05/2021 11:12  ? ?CT HEAD WO CONTRAST (5MM) ? ?Result Date: 10/05/2021 ?CLINICAL DATA:  Stroke, follow up EXAM: CT HEAD WITHOUT CONTRAST TECHNIQUE: Contiguous axial images were obtained from the base of the skull through the vertex without intravenous contrast. RADIATION DOSE REDUCTION: This exam was performed according to the departmental dose-optimization program which includes automated exposure control, adjustment of the mA and/or kV according to patient size and/or use of iterative reconstruction technique. COMPARISON:  10/04/2021 FINDINGS: Brain: Previously seen foci of hyperdensity are no longer identified. No new hemorrhage. No new loss of gray-white differentiation. Ventricles and sulci are stable in size and configuration. Vascular: No new finding. Skull: Calvarium is unremarkable. Sinuses/Orbits: No acute finding. Other: Posterior right scalp soft tissue swelling. IMPRESSION: Previously seen foci of hyperdensity are no longer identified. No new hemorrhage. Electronically Signed   By: Macy Mis M.D.   On: 10/05/2021 11:33  ? ?CT PELVIS WO CONTRAST ? ?Result Date: 10/06/2021 ?CLINICAL DATA:  Pelvic pain EXAM: CT PELVIS WITHOUT CONTRAST TECHNIQUE: Multidetector CT imaging of the pelvis was performed following the standard protocol without intravenous contrast. RADIATION DOSE REDUCTION: This exam was performed according to the departmental dose-optimization program which includes automated exposure control, adjustment of the mA and/or kV according to patient size and/or use of iterative reconstruction technique. COMPARISON:  None. FINDINGS: There is no free fluid in the pelvis. Multiple diverticula are seen in the sigmoid colon. There is stranding in the fat planes adjacent to sigmoid. There is no loculated pericolic  fluid collection. Small amount of free fluid is seen in the right pelvis. Urinary bladder is unremarkable. No fracture is seen in bony structures. No significant changes are noted  in the hip joints. Degenerative changes are noted in facet joints in the lower lumbar spine. Minimal anterolisthesis is seen at L4-L5 level. There is significant spinal stenosis at L4-L5 level. IMPRESSION: Diverticulosis of colon. Wall thickening and pericolic stranding in the sigmoid suggests acute sigmoid diverticulitis. There is no loculated pericolic fluid collection. There is free fluid in right side of pelvis, most likely related to sigmoid diverticulitis. No recent fracture is seen in the pelvis. Lumbar spondylosis, particularly severe at L4-L5 level with spinal stenosis and encroachment of neural foramina. Electronically Signed   By: Elmer Picker M.D.   On: 10/06/2021 12:29  ? ?DG HIPS BILAT WITH PELVIS 3-4 VIEWS ? ?Result Date: 10/05/2021 ?CLINICAL DATA:  76 year old female with severe right side pain. EXAM: DG HIP (WITH OR WITHOUT PELVIS) 3-4V BILAT COMPARISON:  Lumbar radiographs today. CT Abdomen and Pelvis 06/26/2019. FINDINGS: Femoral heads are normally located. Hip joint spaces appear symmetric and stable since 2021, relatively normal for age. No pelvis fracture identified. Grossly intact proximal femurs. Negative visible bowel gas. Left lower quadrant surgical clip is in the on the prior CT. Greater omentum IMPRESSION: No acute osseous abnormality identified. Hip joint spaces within normal limits for age. Electronically Signed   By: Genevie Ann M.D.   On: 10/05/2021 11:13   ? ?Assessment/Plan: ?S/p fall with resolving SAH. Awaiting MRI lumbar. Continue therapy ? ? LOS: 1 day  ? ? ?Ocie Cornfield Darrick Greenlaw ?10/07/2021, 7:53 AM ? ? ?  ?

## 2021-10-07 NOTE — Progress Notes (Signed)
Informed of MRI for today.  ? ?Device system confirmed to be MRI conditional, with implant date > 6 weeks ago, and no evidence of abandoned or epicardial leads in review of most recent CXR ?Interrogation from today reviewed, pt is currently AP-VP at 70 bpm ?Change device settings for MRI to DOO at 90 bpm ? ?Tachy-therapies to off if applicable. ? ?Program device back to pre-MRI settings after completion of exam. ? ?Graciella Freer, PA-C  ?10/07/2021 1:07 PM   ?

## 2021-10-07 NOTE — Progress Notes (Addendum)
?PROGRESS NOTE ? ? ? ?Kathleen [name]  KXF:[id number] DOB: March 27, 1946 DOA: 10/04/2021 ?PCP: [name], MD  ? ? ?Brief Narrative:  ? 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  No reported loss of consciousness.  Patient states she missed her rolling walker and fell to the floor.  EMS was called and brought to the ED for further evaluation. ? ?In the ED, temperature 97.9 ?F, HR 74, RR 16, BP 130/66, SPO2 100% on room air.  Sodium 136, potassium 3.9, chloride 101, CO2 25, glucose 127, BUN 20, creatinine 0.89.  WBC 12.7, hemoglobin 13.2, platelets 249.  INR 1.4.  CT head without contrast with small foci of subarachnoid hemorrhage in the left parietal lobe, no epidural/subdural hematoma, noted atrophy and subcutaneous hematoma right parietal scalp.  CT C-spine with no recent fracture seen, minimal anterior listhesis C4-5 suggestive of previous ligamentous injury and facet degeneration.  Neurosurgery was consulted and recommended medical admission for observation with repeat imaging.  Did not receive reversal of Xarelto. ? ?Assessment & Plan: ?  ?Subarachnoid hemorrhage, traumatic ?Patient sustaining a mechanical fall striking the posterior aspect of her head after missing her rolling walker.  No loss of consciousness.  CT head without contrast with small foci of subarachnoid hemorrhage in the left parietal lobe with no epidural/subdural hematoma.  CT C-spine with no recent fracture seen.  Was evaluated by neurosurgery with recommendation of medical admission with further observation and repeat imaging.  Repeat CT head 4/30 shows previous foci of hyperdensity no longer seen. ?--Continue to  hold Xarelto for 72 hours; likely restart 5/2; but will await MRI results ? ?Acute sigmoid diverticulitis  ?CT pelvis without contrast 5/1 with wall thickening and pericolic stranding sigmoid suggestive of acute sigmoid diverticulitis.  Tolerating diet without abdominal symptoms. ?--Augmentin 875-125 mg p.o. twice daily x10 days ? ?Chronic diastolic congestive heart failure ?Essential hypertension ?--Losartan 25 mg p.o. daily ?--Spironolactone 25 mg p.o. daily ?--holding home Torsemide 20 mg Monday/Friday ? ?Atrial fibrillation on chronic anticoagulation ?--Tikosyn 250 mg twice daily ?--Holding Xarelto as above for 72 hours ? ?Chronic hypoxic respiratory failure ?Hx BOOP ?--Stable on home 4 L nasal cannula ?--Mikael Spray neb daily ?--albuterol PRN ? ?Hypokalemia ?Potassium 3.3, will replete. ? ?Hypothyroidism ?--Levothyroxine 50 mcg p.o. daily ? ?OSA: Continue nocturnal CPAP ? ?Morbid obesity ?Body mass index is 37.89 kg/m?[name]  Discussed with patient needs for aggressive lifestyle changes/weight loss as this complicates all facets of care.  Outpatient follow-up with PCP. ? ?Physical deconditioning ?Gait disturbance ?Right lower extremity weakness/paresthesias ?L-spine spine and bilateral hip x-rays negative for fracture.  Patient continues with difficulty ambulating following a fall.  Seen by PT/OT with recommendation of SNF placement as patient does not have family support at home and would have difficulty navigating stairs and to get to her bathroom.  CT pelvis without contrast with degenerative changes facet joints lower lumbar spine, minimal anterolisthesis L4-L5, significant spinal stenosis L4-L5 no fractures noted and bony structures. ?--MR L-spine: Pending ?--Patient has bed availability [facility]; awaiting MRI and results prior to stability for discharge ? ?DVT prophylaxis: SCDs Start: 10/04/21 2041 ? ?  Code Status: Full Code ?Family Communication: no  family present at bedside this am ? ?Disposition  Plan:  ?Level of care: Progressive ?Status is: Observation ?The patient will require care spanning > 2 midnights and should be moved to inpatient because: Acute diverticulitis noted on CT pelvis, weakness, paresthesias concerning for radiculopathy with recent trauma/fall pending MR L-spine.  We will also likely need SNF placement due to ambulatory dysfunction/gait disturbance; and need for aggressive rehabilitation ?  ? ?Consultants:  ?Neurosurgery ? ?Procedures:  ?None ? ?Antimicrobials:  ?None ? ? ?Subjective: ?Seen at bedside, sitting in bedside chair.  MRI was not performed yesterday as patient has pacemaker that needs to be programmed by device representative which will be done today.  Continues to complain of low back pain radiating down her right hip with associated weakness and paresthesias.  Although she claims paresthesias, she seems to have intact sensation down her entire right leg with physical exam.  Reports she cannot ambulate.  Patient has bed availability at [facility], awaiting MRI L-spine to ensure no significant abnormality before safe for discharge.  No other questions or concerns at this time.  Denies headache, no visual changes, no fever/chills/night sweats, no nausea/vomiting/diarrhea, no chest pain, no palpitations, no shortness of breath, no abdominal pain, no fatigue.  No acute events overnight per nurse staff. ? ?Objective: ?Vitals:  ? 10/06/21 2343 10/07/21 4944 10/07/21 0804 10/07/21 0856  ?BP: (!) 108/52 (!) 114/52 (!) 120/52   ?Pulse: 70 70 70   ?Resp: 16 18 16    ?Temp: 98.7 ?F (37.1 ?C) 98.1 ?F (36.7 ?C) 98.1 ?F (36.7 ?C)   ?TempSrc: Axillary Oral    ?SpO2: 97% 93% 99% 98%  ?Weight:      ?Height:      ? ? ?Intake/Output Summary (Last 24 hours) at 10/07/2021 0936 ?Last data filed at 10/07/2021 0100 ?Gross per 24 hour  ?Intake 1200 ml  ?Output --  ?Net 1200 ml  ? ?Filed Weights  ? 10/04/21 1740 10/04/21 1826  ?Weight: 88 kg 88 kg  ? ? ?Examination: ? ?Physical Exam: ?GEN: NAD, alert  and oriented x 3, obese ?HEENT: NCAT, PERRL, EOMI, sclera clear, MMM ?PULM: CTAB w/o wheezes/crackles, normal respiratory effort, on 4 L nasal cannula which is her baseline ?CV: RRR w/o M/G/R ?GI: abd soft, NTND, NABS, no R/G/M ?MSK: no peripheral edema, muscle strength globally intact 5/5 bilateral upper/lower extremities ?NEURO: CN II-XII intact, no focal deficits, sensation to light touch intact ?PSYCH: normal mood/affect ?Integumentary: dry/intact, no rashes or wounds ? ? ? ?Data Reviewed: I have personally reviewed following labs and imaging studies ? ?CBC: ?Recent Labs  ?Lab 10/04/21 ?1735 10/05/21 ?0135 10/07/21 ?0228  ?WBC 12.7* 10.3 8.6  ?NEUTROABS 10.4*  --   --   ?HGB 13.2 12.0 11.2*  ?HCT 41.8 38.2 35.8*  ?MCV 85.0 84.0 83.6  ?PLT 249 226 194  ? ?Basic Metabolic Panel: ?Recent Labs  ?Lab 10/04/21 ?1735 10/05/21 ?0135 10/07/21 ?0228  ?NA 136 134* 134*  ?K 3.9 4.0 3.3*  ?CL 101 104 103  ?CO2 25 24 22   ?GLUCOSE 127* 136* 188*  ?BUN 20 18 13   ?CREATININE 0.89 0.92 0.79  ?CALCIUM 9.2 9.0 9.3  ?MG  --   --  1.9  ? ?GFR: ?Estimated Creatinine Clearance: 60.9 mL/min (by C-G formula based on SCr of 0.79 mg/dL). ?Liver Function Tests: ?Recent Labs  ?Lab 10/04/21 ?1735  ?AST 18  ?ALT 13  ?ALKPHOS 31*  ?BILITOT 0.7  ?PROT 6.2*  ?ALBUMIN 3.7  ? ?No results for input(s): LIPASE,  AMYLASE in the last 168 hours. ?No results for input(s): AMMONIA in the last 168 hours. ?Coagulation Profile: ?Recent Labs  ?Lab 10/04/21 ?1735  ?INR 1.4*  ? ?Cardiac Enzymes: ?No results for input(s): CKTOTAL, CKMB, CKMBINDEX, TROPONINI in the last 168 hours. ?BNP (last 3 results) ?No results for input(s): PROBNP in the last 8760 hours. ?HbA1C: ?No results for input(s): HGBA1C in the last 72 hours. ?CBG: ?No results for input(s): GLUCAP in the last 168 hours. ?Lipid Profile: ?No results for input(s): CHOL, HDL, LDLCALC, TRIG, CHOLHDL, LDLDIRECT in the last 72 hours. ?Thyroid Function Tests: ?No results for input(s): TSH, T4TOTAL, FREET4,  T3FREE, THYROIDAB in the last 72 hours. ?Anemia Panel: ?No results for input(s): VITAMINB12, FOLATE, FERRITIN, TIBC, IRON, RETICCTPCT in the last 72 hours. ?Sepsis Labs: ?No results for input(s): PROCALCI

## 2021-10-07 NOTE — Progress Notes (Signed)
Patient down to MRI from 4 [place]. Patient has medtronic device. CLE sent. Orders for DOO 90. Will re-program patient once scan is completed.  ?

## 2021-10-08 DIAGNOSIS — I10 Essential (primary) hypertension: Secondary | ICD-10-CM | POA: Diagnosis not present

## 2021-10-08 DIAGNOSIS — J410 Simple chronic bronchitis: Secondary | ICD-10-CM | POA: Diagnosis not present

## 2021-10-08 DIAGNOSIS — I609 Nontraumatic subarachnoid hemorrhage, unspecified: Secondary | ICD-10-CM | POA: Diagnosis not present

## 2021-10-08 DIAGNOSIS — I480 Paroxysmal atrial fibrillation: Secondary | ICD-10-CM | POA: Diagnosis not present

## 2021-10-08 LAB — BASIC METABOLIC PANEL
Anion gap: 9 (ref 5–15)
BUN: 17 mg/dL (ref 8–23)
CO2: 24 mmol/L (ref 22–32)
Calcium: 9.6 mg/dL (ref 8.9–10.3)
Chloride: 103 mmol/L (ref 98–111)
Creatinine, Ser: 0.74 mg/dL (ref 0.44–1.00)
GFR, Estimated: >60 mL/min (ref >60)
Glucose, Bld: 122 mg/dL — ABNORMAL HIGH (ref 70–99)
Potassium: 4.6 mmol/L (ref 3.5–5.1)
Sodium: 136 mmol/L (ref 135–145)

## 2021-10-08 MED ORDER — AMOXICILLIN-POT CLAVULANATE 875-125 MG PO TABS
1.0000 | ORAL_TABLET | Freq: Two times a day (BID) | ORAL | 0 refills | Status: AC
Start: 2021-10-08 — End: 2021-10-15

## 2021-10-08 MED ORDER — TRAMADOL HCL 50 MG PO TABS: 50.0000 mg | ORAL_TABLET | Freq: Four times a day (QID) | ORAL | 0 refills | Status: AC | PRN

## 2021-10-08 MED ORDER — DEXAMETHASONE 4 MG PO TABS: 4.0000 mg | ORAL_TABLET | Freq: Four times a day (QID) | ORAL | 0 refills | Status: DC

## 2021-10-08 MED ORDER — METHYLPREDNISOLONE 4 MG PO TBPK: ORAL_TABLET | ORAL | 0 refills | Status: DC

## 2021-10-08 MED ORDER — DEXAMETHASONE 4 MG PO TABS
4.0000 mg | ORAL_TABLET | Freq: Four times a day (QID) | ORAL | Status: DC
  Administered 2021-10-08 (×2): 4 mg via ORAL
  Filled 2021-10-08 (×2): qty 1

## 2021-10-08 NOTE — TOC Transition Note (Signed)
Transition of Care (TOC) - CM/SW Discharge Note ? ? ?Patient Details  ?Name: Kathleen [name] ?MRN: [id number] ?Date of Birth: 1945/08/17 ? ?Transition of Care (TOC) CM/SW Contact:  ?Eduard Roux, LCSW ?Phone Number: ?10/08/2021, 3:33 PM ? ? ?Clinical Narrative:    ? ?Patient will Discharge to: [facility]  ?Discharge Date: 10/08/2021 ?Family Notified: son ?Transport [id number] ? ?Per MD patient is ready for discharge. RN, patient, and facility notified of discharge. Discharge Summary sent to facility. RN given number for [phone]. Ambulance transport requested for patient.  ? ?Clinical Social Worker signing off. ? ?Antony Blackbird, MSW, LCSW ?Clinical Social Worker ? ? ? ? ?Final next level of care: Skilled Nursing Facility ?Barriers to Discharge: Barriers Resolved ? ? ?Patient Goals and CMS Choice ?Patient states their goals for this hospitalization and ongoing recovery are:: "I'm in so much pain I think rehab will help." ?CMS Medicare.gov Compare Post Acute Care list provided to:: Patient ?Choice offered to / list presented to : Patient ? ?Discharge Placement ?  ?           ?Patient chooses bed at: [facility] ?Patient to be transferred to facility by: [facility] ?Name of family member notified: son ?Patient and family notified of of transfer: 10/08/21 ? ?Discharge Plan and Services ?  ?  ?Post Acute Care Choice: Skilled Nursing Facility          ?  ?  ?  ?  ?  ?  ?  ?  ?  ?  ? ?Social Determinants of Health (SDOH) Interventions ?  ? ? ?Readmission Risk Interventions ? ?  06/28/2019  ?  3:42 PM  ?Readmission Risk Prevention Plan  ?Transportation Screening Complete  ?PCP or Specialist Appt within 3-5 Days Complete  ?HRI or Home Care Consult Complete  ?Palliative Care Screening Not Applicable  ?Medication Review Oceanographer) Complete  ? ? ? ? ? ?

## 2021-10-08 NOTE — Progress Notes (Signed)
Physical Therapy Treatment ?Patient Details ?Name: Kathleen [name] ?MRN: [id number] ?DOB: 1945/07/18 ?Today's Date: 10/08/2021 ? ? ?History of Present Illness The pt is a 76 yo female presenting 4/29 after a fall at her independent living resulting in injury to the back of her head. CT revealed: subarachnoid hemorrhage, no other acute injury or abnormalities. PMH includes: 3L chronic oxygen dependence, pacemaker, A-fib, CHF, asthma/COPD, OSA and OHS. ? ?  ?PT Comments  ? ? Pt tolerates treatment well, ambulating for increased distances. Pt continues to demonstrate slowed mobility in all aspects, but is able to tolerate multiple transfers and ADLs in addition to ambulation. Pt will benefit from continued acute PT services in an effort to return to independence.   ?Recommendations for follow up therapy are one component of a multi-disciplinary discharge planning process, led by the attending physician.  Recommendations may be updated based on patient status, additional functional criteria and insurance authorization. ? ?Follow Up Recommendations ? Skilled nursing-short term rehab (<3 hours/day) ?  ?  ?Assistance Recommended at Discharge Intermittent Supervision/Assistance  ?Patient can return home with the following Assistance with cooking/housework;Assist for transportation;Help with stairs or ramp for entrance;A lot of help with walking and/or transfers;A lot of help with bathing/dressing/bathroom ?  ?Equipment Recommendations ? Rolling walker (2 wheels)  ?  ?Recommendations for Other Services   ? ? ?  ?Precautions / Restrictions Precautions ?Precautions: Fall ?Restrictions ?Weight Bearing Restrictions: No  ?  ? ?Mobility ? Bed Mobility ?Overal bed mobility: Needs Assistance ?Bed Mobility: Supine to Sit ?  ?  ?Supine to sit: Min guard, HOB elevated ?  ?  ?General bed mobility comments: increased time and use of rails ?  ? ?Transfers ?Overall transfer level: Needs assistance ?Equipment used: Rolling walker (2  wheels) ?Transfers: Sit to/from Stand ?Sit to Stand: Min guard ?  ?  ?  ?  ?  ?General transfer comment: increased time ?  ? ?Ambulation/Gait ?Ambulation/Gait assistance: Min guard ?Gait Distance (Feet): 80 Feet ?Assistive device: Rolling walker (2 wheels) ?Gait Pattern/deviations: Step-through pattern ?Gait velocity: reduced ?Gait velocity interpretation: <1.31 ft/sec, indicative of household ambulator ?  ?General Gait Details: pt with slowed step-through gait, reduced stride length ? ? ?Stairs ?  ?  ?  ?  ?  ? ? ?Wheelchair Mobility ?  ? ?Modified Rankin (Stroke Patients Only) ?  ? ? ?  ?Balance Overall balance assessment: Needs assistance ?Sitting-balance support: No upper extremity supported, Feet supported ?Sitting balance-Leahy Scale: Fair ?  ?  ?Standing balance support: Single extremity supported, Bilateral upper extremity supported, Reliant on assistive device for balance ?Standing balance-Leahy Scale: Poor ?  ?  ?  ?  ?  ?  ?  ?  ?  ?  ?  ?  ?  ? ?  ?Cognition Arousal/Alertness: Awake/alert ?Behavior During Therapy: [facility] for tasks assessed/performed ?Overall Cognitive Status: Within Functional Limits for tasks assessed ?  ?  ?  ?  ?  ?  ?  ?  ?  ?  ?  ?  ?  ?  ?  ?  ?  ?  ?  ? ?  ?Exercises   ? ?  ?General Comments General comments (skin integrity, edema, etc.): pt denies dizziness this session, VSS on 4L [place] ?  ?  ? ?Pertinent Vitals/Pain Pain Assessment ?Pain Assessment: Faces ?Faces Pain Scale: Hurts even more ?Pain Location: back ?Pain Descriptors / Indicators: Discomfort ?Pain Intervention(s): Monitored during session  ? ? ?Home Living   ?  ?  ?  ?  ?  ?  ?  ?  ?  ?   ?  ?  Prior Function    ?  ?  ?   ? ?PT Goals (current goals can now be found in the care plan section) Acute Rehab PT Goals ?Patient Stated Goal: to reduce pain, maintain independence ?Progress towards PT goals: Progressing toward goals ? ?  ?Frequency ? ? ? Min 3X/week ? ? ? ?  ?PT Plan Current plan remains appropriate  ? ? ?Co-evaluation    ?  ?  ?  ?  ? ?  ?AM-PAC PT "6 Clicks" Mobility   ?Outcome Measure ? Help needed turning from your back to your side while in a flat bed without using bedrails?: None ?Help needed moving from lying on your back to sitting on the side of a flat bed without using bedrails?: A Little ?Help needed moving to and from a bed to a chair (including a wheelchair)?: A Little ?Help needed standing up from a chair using your arms (e.g., wheelchair or bedside chair)?: A Little ?Help needed to walk in hospital room?: A Little ?Help needed climbing 3-5 steps with a railing? : Total ?6 Click Score: 17 ? ?  ?End of Session Equipment Utilized During Treatment: Oxygen ?Activity Tolerance: Patient tolerated treatment well ?Patient left: in chair;with call bell/phone within reach;with chair alarm set ?Nurse Communication: Mobility status ?PT Visit Diagnosis: Other abnormalities of gait and mobility (R26.89);Muscle weakness (generalized) (M62.81);Pain ?Pain - Right/Left: Left ?Pain - part of body: Hip;Leg ?  ? ? ?Time: [id number] ?PT Time Calculation (min) (ACUTE ONLY): 28 min ? ?Charges:  $Gait Training: 8-22 mins ?$Therapeutic Activity: 8-22 mins          ?          ?Zenaida Niece, PT, DPT ?Acute Rehabilitation ?Pager: [phone] ?Office [phone] ? ? ? ?Zenaida Niece ?10/08/2021, 4:56 PM ? ?

## 2021-10-08 NOTE — Progress Notes (Signed)
Subjective: ?Patient reports still having a lot of buttocks pain that radiates to her right leg, she is sitting up in the chair today. Still reports a lot of pain when walking  ? ?Objective: ?Vital signs in last 24 hours: ?Temp:  [97.8 ?F (36.6 ?C)-98.7 ?F (37.1 ?C)] 98.1 ?F (36.7 ?C) (05/03 1206) ?Pulse Rate:  [69-92] 92 (05/03 1206) ?Resp:  [16-20] 20 (05/03 1206) ?BP: (93-130)/(48-96) 107/84 (05/03 1206) ?SpO2:  [93 %-98 %] 94 % (05/03 1206) ? ?Intake/Output from previous day: ?No intake/output data recorded. ?Intake/Output this shift: ?No intake/output data recorded. ? ?Neurologic: Grossly normal ? ?Lab Results: ?Lab Results  ?Component Value Date  ? WBC 8.6 10/07/2021  ? HGB 11.2 (L) 10/07/2021  ? HCT 35.8 (L) 10/07/2021  ? MCV 83.6 10/07/2021  ? PLT 194 10/07/2021  ? ?Lab Results  ?Component Value Date  ? INR 1.4 (H) 10/04/2021  ? ?BMET ?Lab Results  ?Component Value Date  ? NA 136 10/08/2021  ? K 4.6 10/08/2021  ? CL 103 10/08/2021  ? CO2 24 10/08/2021  ? GLUCOSE 122 (H) 10/08/2021  ? BUN 17 10/08/2021  ? CREATININE 0.74 10/08/2021  ? CALCIUM 9.6 10/08/2021  ? ? ?Studies/Results: ?MR LUMBAR SPINE WO CONTRAST ? ?Result Date: 10/07/2021 ?CLINICAL DATA:  Fall.  Lumbar radiculopathy, trauma EXAM: MRI LUMBAR SPINE WITHOUT CONTRAST TECHNIQUE: Multiplanar, multisequence MR imaging of the lumbar spine was performed. No intravenous contrast was administered. COMPARISON:  CT pelvis 10/06/2021. Lumbar spine x-ray 10/05/2021, MRI 09/11/2009 FINDINGS: Segmentation:  Standard. Alignment:  5 mm grade 1 anterolisthesis L4 on L5. Vertebrae:  No fracture, evidence of discitis, or bone lesion. Conus medullaris and cauda equina: Conus extends to the L1 level. Conus and cauda equina appear normal. Paraspinal and other soft tissues: Similar findings of acute sigmoid diverticulitis with ill-defined free fluid within the pelvis, as seen on recent pelvis CT. Disc levels: T12-L1: No significant disc protrusion, foraminal stenosis, or  canal stenosis. L1-L2: No significant disc protrusion, foraminal stenosis, or canal stenosis. L2-L3: Minimal annular disc bulge. Mild bilateral facet hypertrophy. No foraminal or canal stenosis. L3-L4: Mild annular disc bulge. Mild bilateral facet hypertrophy. No foraminal or canal stenosis. L4-L5: Anterolisthesis with disc uncovering and diffuse disc bulge. Moderate-severe bilateral facet arthropathy with trace bilateral facet joint effusions. Buckling of the ligamentum flavum. Findings contribute to severe canal stenosis with mild bilateral foraminal stenosis. L5-S1: Central disc protrusion with moderate bilateral facet arthropathy. Moderate bilateral subarticular recess stenosis without canal stenosis. Mild right foraminal stenosis. IMPRESSION: 1. Multilevel lumbar spondylosis, as described above. 2. At L4-L5, there is severe canal stenosis and mild bilateral foraminal stenosis. 3. At L5-S1, there is moderate bilateral subarticular recess stenosis and mild right foraminal stenosis. 4. Similar findings of acute sigmoid diverticulitis with ill-defined free fluid within the pelvis, as seen on recent pelvis CT. Electronically Signed   By: Davina Poke D.O.   On: 10/07/2021 14:39   ? ?Assessment/Plan: ?S/p fall with tSAH that has resolved. Her MRI does show severe stenosis at L4-5 but this is chronic. It could be the source of her pain. Will put her on some steroids and let this settle down with time. No acute surgical intervention warranted right now.  ? ? LOS: 2 days  ? ? ?Ocie Cornfield Tyvion Edmondson ?10/08/2021, 1:39 PM ? ? ?  ?

## 2021-10-08 NOTE — Plan of Care (Signed)
Discharged to [facility].  ?

## 2021-10-08 NOTE — Discharge Summary (Addendum)
?Physician Discharge Summary ?  ?Patient: Kathleen [name] MRN: [id number] DOB: 02-04-1946  ?Admit date:     10/04/2021  ?Discharge date: 10/08/21  ?Discharge Physician: Hosie Poisson  ? ?PCP: [name], MD  ? ?Recommendations at discharge:  ?Please follow up with PCP in one week.  ?Please follow up with Neurosurgery as needed.  ? ?Discharge Diagnoses: ?Principal Problem: ?  Subarachnoid hemorrhage ([place]) ?Active Problems: ?  HTN (hypertension) ?  COPD (chronic obstructive pulmonary disease) ([place]) ?  Atrial fibrillation ([place]) ?  Subarachnoid bleed ([place]) ? ? ? ?Hospital Course: ?  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  No reported loss of consciousness.  Patient states she missed her rolling walker and fell to the floor.  EMS was called and brought to the ED for further evaluation. ?  ?In the ED, temperature 97.9 ?F, HR 74, RR 16, BP 130/66, SPO2 100% on room air.  Sodium 136, potassium 3.9, chloride 101, CO2 25, glucose 127, BUN 20, creatinine 0.89.  WBC 12.7, hemoglobin 13.2, platelets 249.  INR 1.4.  CT head without contrast with small foci of subarachnoid hemorrhage in the left parietal lobe, no epidural/subdural hematoma, noted atrophy and subcutaneous hematoma right parietal scalp.  CT C-spine with no recent fracture seen, minimal anterior listhesis C4-5 suggestive of previous ligamentous injury and facet degeneration.  Neurosurgery was consulted and recommended medical admission for observation with repeat imaging.  Did not receive reversal of Xarelto. ?  ? ?Assessment and Plan: ? ?Subarachnoid hemorrhage, traumatic ?Patient sustaining a mechanical fall striking the posterior  aspect of her head after missing her rolling walker.  No loss of consciousness.  CT head without contrast with small foci of subarachnoid hemorrhage in the left parietal lobe with no epidural/subdural hematoma.  CT C-spine with no recent fracture seen.  Was evaluated by neurosurgery with recommendation of medical admission with further observation and repeat imaging.  Repeat CT head 4/30 shows previous foci of hyperdensity no longer seen. ?- restart xarelto today.  ?  ?Acute sigmoid diverticulitis  ?CT pelvis without contrast 5/1 with wall thickening and pericolic stranding sigmoid suggestive of acute sigmoid diverticulitis.  Tolerating diet without abdominal symptoms. ?--Augmentin 875-125 mg p.o. twice daily x10 days ?  ?Chronic diastolic congestive heart failure ?Essential hypertension ?--Losartan 25 mg p.o. daily ?--Spironolactone 25 mg p.o. daily ?--Resume Torsemide 20 mg Monday/Friday ?  ?Atrial fibrillation on chronic anticoagulation ?--Tikosyn 250 mg twice daily ?--Resume Xarelto  ?  ?Chronic hypoxic respiratory failure ?Hx BOOP ?--Stable on home 4 L nasal cannula ?--Maretta Bees neb daily ?--albuterol PRN ?  ?Hypokalemia ?REPLACED.  ?  ?Hypothyroidism ?--Levothyroxine 50 mcg p.o. daily ?  ?OSA: Continue nocturnal CPAP ?  ?Morbid obesity ?Body mass index is 37.89 kg/m?[name]  Discussed with patient needs for aggressive lifestyle changes/weight loss as this complicates all facets of care.  Outpatient follow-up with PCP. ?  ?Physical deconditioning ?Gait disturbance ?Right lower extremity weakness/paresthesias ?L-spine spine and bilateral hip x-rays negative for fracture.  Patient continues with difficulty ambulating following a fall.  Seen by PT/OT with recommendation of SNF placement as patient does not have family support at home and would have difficulty navigating stairs and  to get to her bathroom.  CT pelvis without contrast with degenerative changes facet joints lower lumbar spine, minimal anterolisthesis L4-L5,  significant spinal stenosis L4-L5 no fractures noted and bony structures. ?--MR L-spine: shows spinal stenosis at L4 L5 and L5 and S1.  ?--Patient has bed availability at [facility] per [facility];  ? ? ? ?Consultants: neurosurgery.  ?Procedures performed: MRI ls   ?Disposition: Skilled nursing facility ?Diet recommendation:  ?Discharge Diet Orders (From admission, onward)  ? ?  Start     Ordered  ? 10/08/21 0000  Diet - low sodium heart healthy       ? 10/08/21 1451  ? ?  ?  ? ?  ? ?Regular diet ?DISCHARGE MEDICATION: ?Allergies as of 10/08/2021   ? ?   Reactions  ? Allopurinol Other (See Comments)  ? Reaction:  Dizziness   ? Clindamycin Anaphylaxis, Hives  ? Pneumococcal 13-val Conj Vacc Itching, Swelling, Rash  ? Dronedarone Rash  ? Brovana [arformoterol]   ? Caused muscle pain  ? Budesonide   ? Caused extreme joint pain  ? Entresto [sacubitril-valsartan] Other (See Comments)  ? hypotension  ? Fosamax [alendronate Sodium] Nausea Only  ? Jardiance [empagliflozin]   ? Caused a vaginal infection  ? Meperidine Nausea And Vomiting  ? Microplegia Msa-msg [plegisol]   ? Loopy,diarrhea  ? Rosuvastatin Other (See Comments)  ? Reaction:  Muscle spasms   ? Tetracycline Hives  ? Adhesive [tape] Rash  ? Lovastatin Rash  ? Other reaction(s): Muscle Pain  ? ?  ? ?  ?Medication List  ?  ? ?STOP taking these medications   ? ?ezetimibe 10 MG tablet ?Commonly known as: ZETIA ?  ? ?  ? ?TAKE these medications   ? ?acetaminophen 650 MG CR tablet ?Commonly known as: TYLENOL ?Take 1,300 mg by mouth as needed for pain (take with budesonide). ?  ?albuterol 108 (90 Base) MCG/ACT inhaler ?Commonly known as: VENTOLIN HFA ?Inhale 2 puffs into the lungs every 4 (four) hours as needed for wheezing or shortness of breath. ?  ?amoxicillin-clavulanate 875-125 MG tablet ?Commonly known as: AUGMENTIN ?Take 1 tablet by mouth 2 (two) times daily for 7 days. ?  ?azelastine 0.1 % nasal spray ?Commonly known as: ASTELIN ?Place 2 sprays into both nostrils 2  (two) times daily. ?  ?cyanocobalamin 1000 MCG/ML injection ?Commonly known as: (VITAMIN B-12) ?Inject 1 mL (1,000 mcg total) into the muscle every 30 (thirty) days. ?  ?docusate sodium 100 MG capsule ?Commonly known as: COLACE ?Take 100 mg by mouth 2 (two) times daily. ?  ?dofetilide 250 MCG capsule ?Commonly known as: TIKOSYN ?Take 1 capsule (250 mcg total) by mouth in the morning and at bedtime. ?  ?febuxostat 40 MG tablet ?Commonly known as: ULORIC ?TAKE 1 TABLET(40 MG) BY MOUTH EVERY NIGHT ?What changed: See the new instructions. ?  ?guaiFENesin 600 MG 12 hr tablet ?Commonly known as: [place] ?Take 600 mg by mouth 2 (two) times daily as needed for cough. ?  ?levothyroxine 50 MCG tablet ?Commonly known as: SYNTHROID ?Take 50 mcg by mouth daily before breakfast. ?  ?liver oil-zinc oxide 40 % ointment ?Commonly known as: DESITIN ?Apply 1 application. topically as needed for irritation. ?  ?losartan 25 MG tablet ?Commonly known as: COZAAR ?TAKE 1 TABLET(25 MG) BY MOUTH AT BEDTIME ?What changed: See the new instructions. ?  ?Magnesium 500 MG Tabs ?Take 500 mg by mouth at bedtime. ?  ?methylPREDNISolone 4 MG Tbpk tablet ?Commonly known as: MEDROL DOSEPAK ?  Use as prescribed. ?  ?mupirocin ointment 2 % ?Commonly known as: BACTROBAN ?Apply 1 application. topically 2 (two) times daily. ?  ?Nexletol 180 MG Tabs ?Generic drug: Bempedoic Acid ?Take 1 tablet by mouth daily. ?What changed:  ?how much to take ?when to take this ?  ?nystatin cream ?Commonly known as: MYCOSTATIN ?Apply to affected area 2 times daily ?What changed:  ?how much to take ?how to take this ?when to take this ?additional instructions ?  ?OXYGEN ?Inhale 4 L into the lungs continuous. Use with resmed ventilator ?  ?rivaroxaban 20 MG Tabs tablet ?Commonly known as: XARELTO ?Take 1 tablet (20 mg total) by mouth daily with supper. ?  ?spironolactone 25 MG tablet ?Commonly known as: ALDACTONE ?Take 1 tablet (25 mg total) by mouth every morning. ?  ?torsemide  20 MG tablet ?Commonly known as: DEMADEX ?Take 20mg  (1 Tab) Monday and Friday ?What changed:  ?how much to take ?how to take this ?when to take this ?additional instructions ?  ?traMADol 50 MG tablet ?Comm

## 2021-10-09 NOTE — Progress Notes (Signed)
Pt refusing bipap for the night. ?

## 2021-10-10 ENCOUNTER — Other Ambulatory Visit (HOSPITAL_COMMUNITY): Payer: Self-pay

## 2021-10-10 NOTE — Telephone Encounter (Signed)
Xarelto assistance has a new application through [employer]. New application, POI and OOP sent via fax.  ?

## 2021-10-13 NOTE — Telephone Encounter (Signed)
Received communication from [place] that the patient's application was missing the [employer]. Called [employer]. Representative stated that all required documents were received and that she would move forward with the application process. We should get a fax update once completed.  ?

## 2021-10-16 NOTE — Progress Notes (Signed)
Remote pacemaker transmission.   

## 2021-10-20 ENCOUNTER — Other Ambulatory Visit (HOSPITAL_COMMUNITY): Payer: Self-pay

## 2021-10-20 NOTE — Telephone Encounter (Signed)
Received communication from [place] that the patient was denied for assistance. Called and spoke to [place]. The representative stated that the patient would have to go through the [place] select program. The patient would be eligible to get Xarelto for $85 for 30 days and $240 for 90 days.  ? ?The patient's current 30 day co-pay is $127.76. Will check the patient's OOP report for 2023 and if the 4% has already been spent, will call [employer] back.  ?

## 2021-10-24 ENCOUNTER — Encounter (HOSPITAL_COMMUNITY): Payer: Self-pay

## 2021-10-24 ENCOUNTER — Encounter (HOSPITAL_COMMUNITY): Payer: Self-pay | Admitting: Internal Medicine

## 2021-10-27 ENCOUNTER — Encounter (HOSPITAL_COMMUNITY): Payer: Self-pay | Admitting: Internal Medicine

## 2021-10-28 ENCOUNTER — Other Ambulatory Visit (HOSPITAL_COMMUNITY): Payer: Self-pay | Admitting: Internal Medicine

## 2021-10-31 ENCOUNTER — Telehealth: Payer: Self-pay | Admitting: Internal Medicine

## 2021-10-31 NOTE — Telephone Encounter (Signed)
Joselyn Glassman, PT with [facility], called requesting verbal orders for home health PT for patient. Call back number is [phone] and he said it is fine to leave a detailed message on his voicemail.

## 2021-10-31 NOTE — Telephone Encounter (Signed)
Verbal order given to Joselyn Glassman to proceed with PT.

## 2021-11-04 ENCOUNTER — Telehealth: Payer: Self-pay

## 2021-11-04 ENCOUNTER — Encounter: Payer: Self-pay | Admitting: Internal Medicine

## 2021-11-04 ENCOUNTER — Other Ambulatory Visit: Payer: Self-pay | Admitting: Internal Medicine

## 2021-11-04 DIAGNOSIS — G8929 Other chronic pain: Secondary | ICD-10-CM | POA: Insufficient documentation

## 2021-11-04 DIAGNOSIS — Z515 Encounter for palliative care: Secondary | ICD-10-CM

## 2021-11-04 DIAGNOSIS — M545 Low back pain, unspecified: Secondary | ICD-10-CM | POA: Insufficient documentation

## 2021-11-04 MED ORDER — TRAMADOL HCL 50 MG PO TABS: 50.0000 mg | ORAL_TABLET | Freq: Four times a day (QID) | ORAL | 2 refills | Status: DC | PRN

## 2021-11-04 NOTE — Telephone Encounter (Signed)
Kathleen [name] is requesting VO for OT.  1 time a week for 6 weeks.  Please advise

## 2021-11-04 NOTE — Telephone Encounter (Signed)
Verbal orders given to [facility] for OT.

## 2021-11-06 ENCOUNTER — Ambulatory Visit (INDEPENDENT_AMBULATORY_CARE_PROVIDER_SITE_OTHER): Payer: Medicare Other | Admitting: Pulmonary Disease

## 2021-11-06 ENCOUNTER — Encounter: Payer: Self-pay | Admitting: Pulmonary Disease

## 2021-11-06 DIAGNOSIS — J410 Simple chronic bronchitis: Secondary | ICD-10-CM

## 2021-11-06 DIAGNOSIS — J9611 Chronic respiratory failure with hypoxia: Secondary | ICD-10-CM | POA: Diagnosis not present

## 2021-11-06 MED ORDER — BUDESONIDE-FORMOTEROL FUMARATE 160-4.5 MCG/ACT IN AERO: 2.0000 | INHALATION_SPRAY | Freq: Two times a day (BID) | RESPIRATORY_TRACT | 6 refills | Status: DC

## 2021-11-06 NOTE — Progress Notes (Signed)
Subjective:    Patient ID: Kathleen [name], female    DOB: 1945-10-27, 76 y.o.   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  She has previously seen my partner Dr. Jarvis Newcomer, last seen 03/2021 Asthma onset in 20s She was previously followed at [facility], evaluated by Dr. Myles Gip at [facility] interventional pulmonary and felt not to be a candidate for stent placement for tracheobronchomalacia She was switched from an inhaler regimen in 2022 to budesonide/Brovana and Yupelri combination  PMH - BOOP in 0000000 Chronic diastolic HF, persistent AF s/p AVN ablation and pacemaker implant 2017 (has failed upgrade to BiV).   Meds -did not tolerate budesonide/Brovana due to joint pains  Last hospitalization 11/2020 for pneumonia, respiratory failure She has moved to independent living since. She had a fall with concussion 10/04/2021, I reviewed head CT and hospitalization records.  She has been unable to use her NIV due to scalp hematoma Due to persistent joint pains she self discontinued budesonide and Brovana -after about 3 months joint pain subsided History is corroborated by her son Corene Cornea She uses 3 L pulse at rest and 4 L on ambulation Reviewed last consultation from CHF service  Significant tests/ events reviewed BiPAP titration study 01/2021 12/8 cm  08/2021 CT chest without contrast emphysema, stable nodules  HRCT 01/2021 emphysema, tracheobronchomalacia , bland scarring both bases , new atelectasis/consolidation of the lingula with scattered groundglass in left upper lobe  6MWT 01/2020  With exertion her lowest oxygen saturation was 92% on room air. Her peak HR was 103. She walked a total of 67m which was 25% predicted   Arlyce Harman 01/2021 severe airway obstruction, ratio 59, FEV1 0.95/48%, FVC 1.61/64%    Past Medical History:  Diagnosis Date   Asthma    Atypical  atrial flutter (HCC)    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[place])    CHF (congestive heart failure) (HCC)    Chronic renal insufficiency    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    COPD (chronic obstructive pulmonary disease) (HCC)    Gout    Hypertension    Longstanding persistent atrial fibrillation (HCC)    Nonischemic cardiomyopathy ([place])    Obesity    Pacemaker    Spontaneous pneumothorax 2013    Past Surgical History:  Procedure Laterality Date   ABDOMINAL HYSTERECTOMY     ATRIAL FIBRILLATION ABLATION  07/20/2013   by Dr Westley Gambles   AV nodal ablation  11/01/2013   by Dr Westley Gambles, repeated by Dr Dwana Curd   BREAST BIOPSY Bilateral 1997   negative   CARDIAC CATHETERIZATION     CHOLECYSTECTOMY     HERNIA REPAIR     PACEMAKER INSERTION  06/2017   MDT Viva CRT-P implanted by Dr Westley Gambles after AV nodal ablation,  LV lead could not be placed   PACEMAKER INSERTION  05/2020   with lead bundle    Allergies  Allergen Reactions   Allopurinol Other (See Comments)    Reaction:  Dizziness    Clindamycin Anaphylaxis and Hives   Pneumococcal 13-Val Conj Vacc Itching, Swelling and Rash   Dronedarone Rash   Brovana [Arformoterol]     Caused muscle pain   Budesonide     Caused extreme joint pain   Entresto [Sacubitril-Valsartan] Other (See Comments)    hypotension   Fosamax [Alendronate Sodium] Nausea Only   Jardiance [Empagliflozin]  Caused a vaginal infection   Meperidine Nausea And Vomiting   Microplegia Msa-Msg [Plegisol]     Loopy,diarrhea   Rosuvastatin Other (See Comments)    Reaction:  Muscle spasms    Tetracycline Hives   Adhesive [Tape] Rash   Lovastatin Rash    Other reaction(s): Muscle Pain     Social History   Socioeconomic History   Marital status: Widowed    Spouse name: Not on file   Number of children: Not on file   Years of education: Not on file   Highest education level: Not on file  Occupational History    Occupation: retired  Tobacco Use   Smoking status: Former    Types: Cigarettes    Quit date: 06/09/1987    Years since quitting: 34.4   Smokeless tobacco: Never  Vaping Use   Vaping Use: Never used  Substance and Sexual Activity   Alcohol use: No    Alcohol/week: 0.0 standard drinks   Drug use: No   Sexual activity: Not Currently  Other Topics Concern   Not on file  Social History Narrative   Pt lives in [place] alone.  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  Her son works in [employer].   Social Determinants of Health   Financial Resource Strain: Not on file  Food Insecurity: Not on file  Transportation Needs: Not on file  Physical Activity: Not on file  Stress: Not on file  Social Connections: Not on file  Intimate Partner Violence: Not on file    Family History  Problem Relation Age of Onset   Breast cancer Mother 75       3 different times   Pancreatic cancer Mother    Breast cancer Cousin    Colon cancer Neg Hx    Esophageal cancer Neg Hx    Stomach cancer Neg Hx    Inflammatory bowel disease Neg Hx    Liver disease Neg Hx    Rectal cancer Neg Hx       Review of Systems neg for any significant sore throat, dysphagia, itching, sneezing, nasal congestion or excess/ purulent secretions, fever, chills, sweats, unintended wt loss, pleuritic or exertional cp, hempoptysis, orthopnea pnd or change in chronic leg swelling. Also denies presyncope, palpitations, heartburn, abdominal pain, nausea, vomiting, diarrhea or change in bowel or urinary habits, dysuria,hematuria, rash, arthralgias, visual complaints, headache, numbness weakness or ataxia.     Objective:   Physical Exam  Gen. Pleasant, obese, in no distress, normal affect ENT - no pallor,icterus, no post nasal drip, class 2-3 airway Neck: No JVD, no thyromegaly, no carotid bruits Lungs: no use of accessory muscles, no dullness to percussion, decreased without rales or rhonchi  Cardiovascular:  Rhythm regular, heart sounds  normal, no murmurs or gallops, no peripheral edema Abdomen: soft and non-tender, no hepatosplenomegaly, BS normal. Musculoskeletal: No deformities, no cyanosis or clubbing Neuro:  alert, non focal, no tremors       Assessment & Plan:

## 2021-11-06 NOTE — Assessment & Plan Note (Signed)
Ideally she needs to be on triple therapy. She has been unable to tolerate budesonide and Brovana She feels Mikael Spray has been of benefit and will continue. We will add Symbicort so that she is on open triple  We discussed signs and symptoms of COPD exacerbation

## 2021-11-06 NOTE — Patient Instructions (Addendum)
  X Rx for symbicort - 2puffs twice daily  Continue on Yupelri  X Amb sat on 4 L  pulse   Get back on Resmed machine - expectation is 4 hrs/ night

## 2021-11-06 NOTE — Assessment & Plan Note (Signed)
I encouraged her to get back on her NIV now that the scalp hematoma has subsided. It does seem like BiPAP was adequate on the titration study and we can consider switching out the NIV to BiPAP 12/8 in the future  On ambulation she was able to maintain saturation on 4 L pulse

## 2021-11-07 ENCOUNTER — Telehealth: Payer: Self-pay | Admitting: Internal Medicine

## 2021-11-07 NOTE — Telephone Encounter (Signed)
Called in requesting verbals for Speech Therapy 1x week- 8 weeks. Also, pt does not want OT services, requesting verbals to d/c.   Please call ASAP.

## 2021-11-07 NOTE — Telephone Encounter (Signed)
Verbal order given to [place] to start speech therapy and d/c

## 2021-11-11 ENCOUNTER — Other Ambulatory Visit: Payer: Medicare Other | Admitting: Family Medicine

## 2021-11-11 ENCOUNTER — Encounter: Payer: Self-pay | Admitting: Family Medicine

## 2021-11-11 VITALS — HR 74 | Resp 18

## 2021-11-11 DIAGNOSIS — Z7901 Long term (current) use of anticoagulants: Secondary | ICD-10-CM

## 2021-11-11 DIAGNOSIS — F0781 Postconcussional syndrome: Secondary | ICD-10-CM | POA: Insufficient documentation

## 2021-11-11 DIAGNOSIS — I480 Paroxysmal atrial fibrillation: Secondary | ICD-10-CM

## 2021-11-11 DIAGNOSIS — K5904 Chronic idiopathic constipation: Secondary | ICD-10-CM

## 2021-11-11 DIAGNOSIS — K921 Melena: Secondary | ICD-10-CM | POA: Insufficient documentation

## 2021-11-11 DIAGNOSIS — I609 Nontraumatic subarachnoid hemorrhage, unspecified: Secondary | ICD-10-CM

## 2021-11-11 DIAGNOSIS — Z515 Encounter for palliative care: Secondary | ICD-10-CM

## 2021-11-11 NOTE — Progress Notes (Signed)
[facility] Pall[facility] Care Consult Note Telephone: [phone]  Fax: [phone]    Date of encounter: 11/11/21 2:49 PM PATIENT NAME: Kathleen [name] [address] [place] [address]   [id number] (home)  DOB: Nov 16, 1945 MRN: [id number] PRIMARY CARE PROVIDER:    [name], MD,  [place] [address] [phone]  REFERRING PROVIDER:   [name], MD [address] [place],  [place] [address] [phone]  RESPONSIBLE PARTY:    Contact Information     Name Relation Home Work Ward Son [phone]  [phone]   Gretchen, [place] [phone]  [phone]   Alveria Apley Daughter   [phone]        I met face to face [place] in [place]. Palliative Care was asked to follow this patient by consultation request of  [name], MD to address advance care planning and complex medical decision making. 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 Concern for possible vertigo, may benefit from vestibular therapy with PT Has Tramadol prescribed through PCP for headache.  PAF with chronic anticoagulation Rate controlled on Tikosyn Anticoagulated on Xarelto Some blood in stool x 1-last HGB 11.2/HCT 35.8% on 10/07/21  Chronic idiopathic constipation/blood in stool Advised to avoid straining. Can use Linzess as prescribed. Likely compounded by pain med.  Continue Colace, can substitute Sennokot for Colace or add generic PEG 17 gm in 8 ounces fluid of choice daily. 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  Palliative Care  Encounter Although pt had recent [place] due to fall and has had some significant weight loss over the last 5 months (17 lbs); she is living independently, managing bathing and dressing with no confusion or apparent impaired judgement. Her respiratory status appears to be stable, not worsening.  No evidence of decompensated heart failure.   Continue to monitor closely, doubt prognosis of 6 months or less at present time.  Advance Care Planning/Goals of Care: Goals include to maximize quality of life and symptom management.  CODE STATUS: Currently full code    Follow up Palliative Care Visit: Palliative care will continue to follow for complex medical decision making, advance care planning, and clarification of goals. Return 3 weeks or prn.   This visit was coded based on medical decision making (MDM).  PPS: 70%  HOSPICE ELIGIBILITY/DIAGNOSIS: TBD  Chief Complaint: Acute visit requested by facility to assess pt for possible transition to Hospice services with recent decline in function.  HISTORY OF PRESENT ILLNESS:  Kathleen [name] is a 76 y.o. year old female with recent fall and traumatic head injury resulting in subarachnoid hemorrhage, concussion and post concussive syndrome. 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 Pt had an admission on 10/04/21 due to a fall with resulting subarachnoid hemorrhage.  She fell backwards and hit her posterior head.  She has concussion and cannot tolerate pressure on her posterior head, even currently so she has  been unable to tolerate her NIV for sleep apnea.  She has a therapy vest from [facility] but cannot put it on because it is too heavy and she is unable to manipulate the vest because of a torn right rotator cuff.  She does  want a smaller portable O2 tank that is more lightweight.  She states that they do not want her to use her rollator but to use a rolling walker for ambulation.  She but states that it is too heavy to lift when getting off the elevator.  Having headaches since fall, particularly after periods of concentrating.  She also has dizziness that she says sometimes is momentary, other times she describes a sudden sensation of the room "spinning around me then all of a sudden things will start to come at my face."  No fall since that fall.  Right side, low back hurts.  C/o she can't see as well.  No nausea or vomiting, difficulty waking up. Has had 4 episodes of gout and stopped daily Prednisone after 2-3 years.  Appetite is improved.  Since the week before last Thanksgiving has lost 42 lbs and thinks it is in part due to her stopping eating desserts and ice cream. Independent with bathing and dressing, continent of bowel and bladder. Denies coughing or choking after eating or drinking. Pt states she had a recent episode of constipation with severe straining and noted large amount of bright red blood on the stool and in the commode.  She says she has been taking colace as much as 3 capsules per day but is also on Tramadol for pain.  She states taking the high dose of Linzess is sometimes too much.  She denies any further bleeding. Working with PT from [facility] for strengthening, [facility] for cognitive exercises. States becoming easily frustrated and irritable at times. Last 2d echo done at [facility] 01/08/21 for chronic combined systolic and diastolic CHF: Mild LVH/moderate ventricular hypertrophy, EF > 55%, 36 mm HG peak RV pressure.  History obtained from review of EMR, discussion with  Kathleen [name].  I reviewed available labs, medications, imaging, studies and related documents from the EMR.  Records reviewed and summarized above.   ROS  General: NAD Head:  c/o headache with intense prolonged focus or application of pressure to  posterior occiput EYES: endorses worsened vision since fall ENMT: denies dysphagia Cardiovascular: denies chest pain, denies DOE Pulmonary: has intermittent cough and recently coughed up large hard yellow "ball", denies increased SOB Abdomen: endorses good appetite, endorses constipation, endorses continence of bowel GU: denies dysuria, endorses continence of urine MSK:  denies increased weakness, no further falls reported.  Reports unsteady gait, pain in low back particularly on the right. Skin: denies rashes or wounds Neurological:  denies insomnia Psych: Endorses intermittent irritability and mood lability Heme/lymph/immuno: denies bruises, c/o bright red blood in stool and commode after straining  Physical Exam: Current and past weights: 190 lbs as of 11/06/2021, 199 lbs as of 08/07/21, weight 207 lbs 3.2 ounces as of 07/04/21 Constitutional: NAD General: frail appearing, thin/WNWD/obese  EYES: anicteric sclera, lids intact, no discharge. No facial asymmetry ENMT: intact hearing, oral mucous membranes moist, dentition intact CV: S1S2, RRR, trace non-pitting pedal edema bilaterally, some dusky discoloration of bilateral feet Pulmonary: CTAB except fine crackles RLL, no increased work of breathing, no cough, On 4L/ min [place] Abdomen: hypo-active BS + 4 quadrants, soft and non tender, no ascites GU: deferred MSK: no sarcopenia, moves all extremities, ambulatory Skin: warm and dry,  no rashes or wounds on visible skin Neuro:  no generalized weakness,  no cognitive impairment Psych: non-anxious affect, A and O x 3 Hem/lymph/immuno: no widespread bruising   Thank you for the opportunity to participate in the care of Kathleen [name].  The palliative care team will continue to follow. Please call our office at [phone] if we can be of additional assistance.   Marijo Conception, FNP-C  COVID-19 PATIENT SCREENING TOOL Asked and negative response unless otherwise noted:   Have you had symptoms of  covid, tested positive or been in contact with someone with symptoms/positive test in the past 5-10 days?  No

## 2021-11-12 ENCOUNTER — Encounter: Payer: Self-pay | Admitting: Internal Medicine

## 2021-11-12 ENCOUNTER — Telehealth: Payer: Self-pay

## 2021-11-12 ENCOUNTER — Ambulatory Visit (INDEPENDENT_AMBULATORY_CARE_PROVIDER_SITE_OTHER): Payer: Medicare Other | Admitting: Internal Medicine

## 2021-11-12 VITALS — BP 126/76 | HR 76 | Temp 97.8°F | Resp 16 | Ht 62.0 in | Wt 189.0 lb

## 2021-11-12 DIAGNOSIS — F0781 Postconcussional syndrome: Secondary | ICD-10-CM

## 2021-11-12 DIAGNOSIS — I5032 Chronic diastolic (congestive) heart failure: Secondary | ICD-10-CM

## 2021-11-12 DIAGNOSIS — E611 Iron deficiency: Secondary | ICD-10-CM | POA: Diagnosis not present

## 2021-11-12 DIAGNOSIS — E039 Hypothyroidism, unspecified: Secondary | ICD-10-CM | POA: Diagnosis not present

## 2021-11-12 DIAGNOSIS — Z515 Encounter for palliative care: Secondary | ICD-10-CM

## 2021-11-12 DIAGNOSIS — J410 Simple chronic bronchitis: Secondary | ICD-10-CM | POA: Diagnosis not present

## 2021-11-12 DIAGNOSIS — G8929 Other chronic pain: Secondary | ICD-10-CM

## 2021-11-12 DIAGNOSIS — M545 Low back pain, unspecified: Secondary | ICD-10-CM

## 2021-11-12 DIAGNOSIS — J9611 Chronic respiratory failure with hypoxia: Secondary | ICD-10-CM

## 2021-11-12 LAB — IBC + FERRITIN
Ferritin: 155 ng/mL (ref 10.0–291.0)
Iron: 126 ug/dL (ref 42–145)
Saturation Ratios: 28.7 % (ref 20.0–50.0)
TIBC: 439.6 ug/dL (ref 250.0–450.0)
Transferrin: 314 mg/dL (ref 212.0–360.0)

## 2021-11-12 LAB — CBC WITH DIFFERENTIAL/PLATELET
Basophils Absolute: 0 10*3/uL (ref 0.0–0.1)
Basophils Relative: 0.1 % (ref 0.0–3.0)
Eosinophils Absolute: 0 10*3/uL (ref 0.0–0.7)
Eosinophils Relative: 0.1 % (ref 0.0–5.0)
HCT: 40.2 % (ref 36.0–46.0)
Hemoglobin: 13.2 g/dL (ref 12.0–15.0)
Lymphocytes Relative: 6.1 % — ABNORMAL LOW (ref 12.0–46.0)
Lymphs Abs: 1.2 10*3/uL (ref 0.7–4.0)
MCHC: 32.9 g/dL (ref 30.0–36.0)
MCV: 83 fl (ref 78.0–100.0)
Monocytes Absolute: 1 10*3/uL (ref 0.1–1.0)
Monocytes Relative: 5.4 % (ref 3.0–12.0)
Neutro Abs: 16.8 10*3/uL — ABNORMAL HIGH (ref 1.4–7.7)
Neutrophils Relative %: 88.3 % — ABNORMAL HIGH (ref 43.0–77.0)
Platelets: 285 10*3/uL (ref 150.0–400.0)
RBC: 4.84 Mil/uL (ref 3.87–5.11)
RDW: 17.6 % — ABNORMAL HIGH (ref 11.5–15.5)
WBC: 19 10*3/uL (ref 4.0–10.5)

## 2021-11-12 LAB — TSH: TSH: 0.2 u[IU]/mL — ABNORMAL LOW (ref 0.35–5.50)

## 2021-11-12 MED ORDER — XTAMPZA ER 9 MG PO C12A: 1.0000 | EXTENDED_RELEASE_CAPSULE | Freq: Two times a day (BID) | ORAL | 0 refills | Status: DC | PRN

## 2021-11-12 NOTE — Telephone Encounter (Signed)
CRITICAL VALUE STICKER  CRITICAL VALUE: WBC 19.0   RECEIVER (on-site recipient of call): Tekesha Almgren, CMA  DATE & TIME NOTIFIED: 6/7 @ 2.05pm  MESSENGER (representative from lab): Clydie Braun  MD NOTIFIED: PCP  TIME OF NOTIFICATION: 2.05pm

## 2021-11-12 NOTE — Patient Instructions (Signed)
Hypothyroidism ? ?Hypothyroidism is when the thyroid gland does not make enough of certain hormones. This is called an underactive thyroid. The thyroid gland is a small gland located in the lower front part of the neck, just in front of the windpipe (trachea). This gland makes hormones that help control how the body uses food for energy (metabolism) as well as how the heart and brain function. These hormones also play a role in keeping your bones strong. When the thyroid is underactive, it produces too little of the hormones thyroxine (T4) and triiodothyronine (T3). ?What are the causes? ?This condition may be caused by: ?Hashimoto's disease. This is a disease in which the body's disease-fighting system (immune system) attacks the thyroid gland. This is the most common cause. ?Viral infections. ?Pregnancy. ?Certain medicines. ?Birth defects. ?Problems with a gland in the center of the brain (pituitary gland). ?Lack of enough iodine in the diet. ?Other causes may include: ?Past radiation treatments to the head or neck for cancer. ?Past treatment with radioactive iodine. ?Past exposure to radiation in the environment. ?Past surgical removal of part or all of the thyroid. ?What increases the risk? ?You are more likely to develop this condition if: ?You are female. ?You have a family history of thyroid conditions. ?You use a medicine called lithium. ?You take medicines that affect the immune system (immunosuppressants). ?What are the signs or symptoms? ?Common symptoms of this condition include: ?Not being able to tolerate cold. ?Feeling as though you have no energy (lethargy). ?Lack of appetite. ?Constipation. ?Sadness or depression. ?Weight gain that is not explained by a change in diet or exercise habits. ?Menstrual irregularity. ?Dry skin, coarse hair, or brittle nails. ?Other symptoms may include: ?Muscle pain. ?Slowing of thought processes. ?Poor memory. ?How is this diagnosed? ?This condition may be diagnosed  based on: ?Your symptoms, your medical history, and a physical exam. ?Blood tests. ?You may also have imaging tests, such as an ultrasound or MRI. ?How is this treated? ?This condition is treated with medicine that replaces the thyroid hormones that your body does not make. After you begin treatment, it may take several weeks for symptoms to go away. ?Follow these instructions at home: ?Take over-the-counter and prescription medicines only as told by your health care provider. ?If you start taking any new medicines, tell your health care provider. ?Keep all follow-up visits as told by your health care provider. This is important. ?As your condition improves, your dosage of thyroid hormone medicine may change. ?You will need to have blood tests regularly so that your health care provider can monitor your condition. ?Contact a health care provider if: ?Your symptoms do not get better with treatment. ?You are taking thyroid hormone replacement medicine and you: ?Sweat a lot. ?Have tremors. ?Feel anxious. ?Lose weight rapidly. ?Cannot tolerate heat. ?Have emotional swings. ?Have diarrhea. ?Feel weak. ?Get help right away if: ?You have chest pain. ?You have an irregular heartbeat. ?You have a rapid heartbeat. ?You have difficulty breathing. ?These symptoms may be an emergency. Get help right away. Call 911. ?Do not wait to see if the symptoms will go away. ?Do not drive yourself to the hospital. ?Summary ?Hypothyroidism is when the thyroid gland does not make enough of certain hormones (it is underactive). ?When the thyroid is underactive, it produces too little of the hormones thyroxine (T4) and triiodothyronine (T3). ?The most common cause is Hashimoto's disease, a disease in which the body's disease-fighting system (immune system) attacks the thyroid gland. The condition can also be caused   by viral infections, medicine, pregnancy, or past radiation treatment to the head or neck. ?Symptoms may include weight gain, dry  skin, constipation, feeling as though you do not have energy, and not being able to tolerate cold. ?This condition is treated with medicine to replace the thyroid hormones that your body does not make. ?This information is not intended to replace advice given to you by your health care provider. Make sure you discuss any questions you have with your health care provider. ?Document Revised: 05/27/2021 Document Reviewed: 05/27/2021 ?Elsevier Patient Education ? 2023 [employer] ? ?

## 2021-11-12 NOTE — Progress Notes (Signed)
Subjective:  Patient ID: Kathleen [name], female    DOB: [birth year]  Age: 76 y.o. MRN: [id number]  CC: Anemia and Hypothyroidism   HPI LANAISHA RAFFA presents for f/up - She complains that tramadol is not controlling her pain.  Dr Yetta Barre,  This is Kathleen [name], nurse practitioner with [facility] and I am following your patient.  I was asked by her independent living facility to evaluate her because of recent changes/decline in function for possible transition to Hospice services. My note is available in Epic and I had a visit with her on 11/11/21.  As you are most likely already familiar pt had a fall with traumatic subarachnoid hemorrhage at the beginning of May 2023.  She continues to be symptomatic with headache intermittently, worsened vision, and what seems consistent with vertigo.  She is receiving PT through [facility] and if you agree with the assessment it may be good to see if they can do some vestibular PT rehab with her to decrease the dizziness and risk of fall.  She also has had some constipation with recent episode x 1 of bright red frank blood in stool after significant straining. She has lost 17 lbs since the first of the year but states she has cut out deserts. 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  Her last HGB was done around the fall and was 11.4 on 10/07/21.  VS were stable.  She appears stable from a respiratory status and without evidence of decompensated heart failure.  Unless I am missing something, I do not see clear evidence of a life expectancy of 6 months or less to indicate a need to refer to Hospice.  I do think she bears close watching and if continued issues with balance a discussion of risk vs benefit for continued anticoagulation.  Please advise if you believe differently after  you have seen her or call if you have any questions.  Respectfully,  Kathleen [name]  Cell [phone]    Outpatient Medications Prior to Visit  Medication Sig Dispense Refill   acetaminophen (TYLENOL) 650 MG CR tablet Take 1,300 mg by mouth as needed for pain (take with budesonide).     albuterol (PROVENTIL HFA;VENTOLIN HFA) 108 (90 Base) MCG/ACT inhaler Inhale 2 puffs into the lungs every 4 (four) hours as needed for wheezing or shortness of breath.     azelastine (ASTELIN) 0.1 % nasal spray Place 2 sprays into both nostrils 2 (two) times daily.     Bempedoic Acid (NEXLETOL) 180 MG TABS Take 1 tablet by mouth daily. (Patient taking differently: Take 180 mg by mouth at bedtime.) 90 tablet 1   Cholecalciferol (VITAMIN D3) 50 MCG (2000 UT) TABS Take 2,000 Units by mouth at bedtime.     cyanocobalamin (,VITAMIN B-12,) 1000 MCG/ML injection Inject 1 mL (1,000 mcg total) into the muscle every 30 (thirty) days. 10 mL 0   docusate sodium (COLACE) 100 MG capsule Take 100 mg by mouth 2 (two) times daily.     dofetilide (TIKOSYN) 250 MCG capsule Take 1 capsule (250 mcg total) by mouth in the morning and at bedtime. 60 capsule 4   febuxostat (ULORIC) 40 MG tablet TAKE 1 TABLET(40 MG) BY MOUTH EVERY NIGHT (Patient taking differently: Take 40 mg by mouth at bedtime.) 90 tablet 1  guaiFENesin (MUCINEX) 600 MG 12 hr tablet Take 600 mg by mouth 2 (two) times daily as needed for cough.     liver oil-zinc oxide (DESITIN) 40 % ointment Apply 1 application. topically as needed for irritation. 56.7 g 0   losartan (COZAAR) 25 MG tablet TAKE 1 TABLET(25 MG) BY MOUTH AT BEDTIME (Patient taking differently: Take 25 mg by mouth at bedtime.) 90 tablet 3   Magnesium 500 MG TABS Take 500 mg by mouth at bedtime.     mupirocin ointment (BACTROBAN) 2 % Apply 1 application. topically 2 (two) times daily. 30 g 2   nystatin cream (MYCOSTATIN) Apply to affected area 2 times daily (Patient taking differently: Apply 1  application. topically 2 (two) times daily.) 90 g 1   OXYGEN Inhale 4 L into the lungs continuous. Use with resmed ventilator     rivaroxaban (XARELTO) 20 MG TABS tablet Take 1 tablet (20 mg total) by mouth daily with supper. 30 tablet 3   spironolactone (ALDACTONE) 25 MG tablet TAKE 1 TABLET(25 MG) BY MOUTH EVERY MORNING 90 tablet 3   torsemide (DEMADEX) 20 MG tablet Take 20mg  (1 Tab) Monday and Friday 60 tablet 3   YUPELRI 175 MCG/3ML nebulizer solution USE 1 VIAL IN NEBULIZER DAILY - DO NOT MIX WITH OTHER NEB MEDS,USE BEFORE OR AFTER (Patient taking differently: Take 175 mcg by nebulization daily.) 30 mL 11   levothyroxine (SYNTHROID) 50 MCG tablet Take 50 mcg by mouth daily before breakfast.  2   traMADol (ULTRAM) 50 MG tablet Take 1 tablet (50 mg total) by mouth every 6 (six) hours as needed. 100 tablet 2   budesonide-formoterol (SYMBICORT) 160-4.5 MCG/ACT inhaler Inhale 2 puffs into the lungs 2 (two) times daily. 1 each 6   No facility-administered medications prior to visit.    ROS Review of Systems  Constitutional:  Positive for fatigue. Negative for appetite change, chills, diaphoresis and unexpected weight change.  Respiratory:  Positive for apnea, cough and shortness of breath. Negative for choking, chest tightness and wheezing.   Cardiovascular:  Negative for chest pain, palpitations and leg swelling.  Gastrointestinal:  Positive for constipation. Negative for abdominal pain, diarrhea, nausea and vomiting.  Endocrine: Negative.   Genitourinary: Negative.  Negative for difficulty urinating.  Musculoskeletal:  Positive for back pain, gait problem and neck pain. Negative for joint swelling and myalgias.  Skin: Negative.   Neurological:  Positive for dizziness, speech difficulty, weakness and numbness. Negative for facial asymmetry, light-headedness and headaches.  Hematological:  Negative for adenopathy. Does not bruise/bleed easily.  Psychiatric/Behavioral: Negative.       Objective:  BP 126/76 (BP Location: Right Arm, Patient Position: Sitting, Cuff Size: Large)   Pulse 76   Temp 97.8 F (36.6 C) (Oral)   Resp 16   Ht 5\' 2"  (1.575 m)   Wt 189 lb (85.7 kg)   SpO2 96%   BMI 34.57 kg/m   BP Readings from Last 3 Encounters:  11/12/21 126/76  11/06/21 126/74  10/08/21 106/62    Wt Readings from Last 3 Encounters:  11/12/21 189 lb (85.7 kg)  11/06/21 190 lb (86.2 kg)  10/04/21 194 lb 0.1 oz (88 kg)    Physical Exam Vitals reviewed.  Constitutional:      General: She is not in acute distress.    Appearance: She is ill-appearing (continuous oxygen). She is not toxic-appearing or diaphoretic.  HENT:     Nose: Nose normal.     Mouth/Throat:     Mouth:  Mucous membranes are moist.  Eyes:     General: No scleral icterus.    Conjunctiva/sclera: Conjunctivae normal.  Cardiovascular:     Rate and Rhythm: Normal rate and regular rhythm.     Heart sounds: No murmur heard. Pulmonary:     Effort: Pulmonary effort is normal.     Breath sounds: Examination of the right-lower field reveals rales. Examination of the left-lower field reveals rales. Rales present. No decreased breath sounds, wheezing or rhonchi.  Abdominal:     General: Abdomen is protuberant. Bowel sounds are normal. There is no distension.     Palpations: Abdomen is soft. There is no hepatomegaly, splenomegaly or mass.     Tenderness: There is no abdominal tenderness.  Musculoskeletal:     Cervical back: Neck supple.  Lymphadenopathy:     Cervical: No cervical adenopathy.  Skin:    General: Skin is warm and dry.     Findings: No rash.  Neurological:     Mental Status: Mental status is at baseline.     Motor: Weakness present.     Gait: Gait abnormal.  Psychiatric:        Mood and Affect: Mood normal.        Behavior: Behavior normal.     Lab Results  Component Value Date   WBC 19.0 Repeated and verified X2. (HH) 11/12/2021   HGB 13.2 11/12/2021   HCT 40.2 11/12/2021    PLT 285.0 11/12/2021   GLUCOSE 122 (H) 10/08/2021   CHOL 174 08/07/2021   TRIG 200.0 (H) 08/07/2021   HDL 35.00 (L) 08/07/2021   LDLCALC 99 08/07/2021   ALT 13 10/04/2021   AST 18 10/04/2021   NA 136 10/08/2021   K 4.6 10/08/2021   CL 103 10/08/2021   CREATININE 0.74 10/08/2021   BUN 17 10/08/2021   CO2 24 10/08/2021   TSH 0.20 (L) 11/12/2021   INR 1.4 (H) 10/04/2021   HGBA1C 5.9 08/07/2021   MICROALBUR 0.8 08/07/2021    DG Lumbar Spine 2-3 Views  Result Date: 10/05/2021 CLINICAL DATA:  76 year old female with right side low back pain. EXAM: LUMBAR SPINE - 2-3 VIEW COMPARISON:  CT Abdomen and Pelvis 06/26/2019. FINDINGS: Stable cholecystectomy clips. Mild levoconvex lumbar scoliosis. Normal lumbar segmentation. Stable lumbar lordosis since 2021 with mild grade anterolisthesis of L4 on L5. Mild to moderate lower lumbar facet hypertrophy. Stable disc spaces. No acute osseous abnormality identified. Calcified aortic atherosclerosis. Partially visible chronic cardiac pacemaker. Negative visible lung bases and bowel gas. IMPRESSION: 1. No acute osseous abnormality identified in the lumbar spine. 2. Chronic grade 1 anterolisthesis of L4 on L5 with facet arthropathy. 3.  Aortic Atherosclerosis (ICD10-I70.0). Electronically Signed   By: Genevie Ann M.D.   On: 10/05/2021 11:12   CT HEAD WO CONTRAST (5MM)  Result Date: 10/05/2021 CLINICAL DATA:  Stroke, follow up EXAM: CT HEAD WITHOUT CONTRAST TECHNIQUE: Contiguous axial images were obtained from the base of the skull through the vertex without intravenous contrast. RADIATION DOSE REDUCTION: This exam was performed according to the departmental dose-optimization program which includes automated exposure control, adjustment of the mA and/or kV according to patient size and/or use of iterative reconstruction technique. COMPARISON:  10/04/2021 FINDINGS: Brain: Previously seen foci of hyperdensity are no longer identified. No new hemorrhage. No new loss of  gray-white differentiation. Ventricles and sulci are stable in size and configuration. Vascular: No new finding. Skull: Calvarium is unremarkable. Sinuses/Orbits: No acute finding. Other: Posterior right scalp soft tissue swelling. IMPRESSION: Previously  seen foci of hyperdensity are no longer identified. No new hemorrhage. Electronically Signed   By: Macy Mis M.D.   On: 10/05/2021 11:33   CT Head Wo Contrast  Result Date: 10/04/2021 CLINICAL DATA:  Trauma, patient on anticoagulationregimen EXAM: CT HEAD WITHOUT CONTRAST TECHNIQUE: Contiguous axial images were obtained from the base of the skull through the vertex without intravenous contrast. RADIATION DOSE REDUCTION: This exam was performed according to the departmental dose-optimization program which includes automated exposure control, adjustment of the mA and/or kV according to patient size and/or use of iterative reconstruction technique. COMPARISON:  04/15/2021 FINDINGS: Brain: There are small foci of increased density in the left parietal cortex. This finding may suggest small foci of subarachnoid hemorrhage. There is no epidural or subdural fluid collection. There is no demonstrable blood within the ventricles. There is no shift of midline structures. Cortical sulci are prominent. Vascular: Unremarkable. Skull: No fracture is seen in the calvarium. There is subcutaneous hematoma in the right parietal scalp. Sinuses/Orbits: There is mild mucosal thickening in the ethmoid and right maxillary sinuses. There is possible air-fluid level in the left side of sphenoid sinus. Other: None IMPRESSION: There are small foci of subarachnoid hemorrhage in the left parietal lobe. There is no epidural or subdural hematoma. Atrophy. There is subcutaneous hematoma in the right parietal scalp. Sinusitis. Imaging findings were relayed to Dr. Dina Rich by telephone call. Electronically Signed   By: Elmer Picker M.D.   On: 10/04/2021 17:36   CT Cervical Spine Wo  Contrast  Result Date: 10/04/2021 CLINICAL DATA:  Trauma EXAM: CT CERVICAL SPINE WITHOUT CONTRAST TECHNIQUE: Multidetector CT imaging of the cervical spine was performed without intravenous contrast. Multiplanar CT image reconstructions were also generated. RADIATION DOSE REDUCTION: This exam was performed according to the departmental dose-optimization program which includes automated exposure control, adjustment of the mA and/or kV according to patient size and/or use of iterative reconstruction technique. COMPARISON:  None. FINDINGS: Alignment: There is minimal 1-2 mm anterolisthesis at C4-C5 level. This may be due to previous ligament injury and facet degeneration. Skull base and vertebrae: No recent fracture is seen. Tiny smooth marginated calcifications adjacent to the anterior inferior aspects of bodies of C4 and C5 vertebrae may suggest ununited accessory ossification centers. Soft tissues and spinal canal: There is no central spinal stenosis. Disc levels: There is mild encroachment of neural foramina at C3-C4 and C4-C5 levels. Upper chest: Centrilobular emphysema is seen. Other: There is inhomogeneous attenuation in the thyroid. IMPRESSION: No recent fracture is seen in the cervical spine. Minimal anterolisthesis at C4-C5 level may suggest previous ligament injury and facet degeneration. COPD.  There is inhomogeneous attenuation in the thyroid. Electronically Signed   By: Elmer Picker M.D.   On: 10/04/2021 17:40   DG HIPS BILAT WITH PELVIS 3-4 VIEWS  Result Date: 10/05/2021 CLINICAL DATA:  76 year old female with severe right side pain. EXAM: DG HIP (WITH OR WITHOUT PELVIS) 3-4V BILAT COMPARISON:  Lumbar radiographs today. CT Abdomen and Pelvis 06/26/2019. FINDINGS: Femoral heads are normally located. Hip joint spaces appear symmetric and stable since 2021, relatively normal for age. No pelvis fracture identified. Grossly intact proximal femurs. Negative visible bowel gas. Left lower quadrant  surgical clip is in the on the prior CT. Greater omentum IMPRESSION: No acute osseous abnormality identified. Hip joint spaces within normal limits for age. Electronically Signed   By: Genevie Ann M.D.   On: 10/05/2021 11:13    Assessment & Plan:   Makaylia was seen today for  anemia and hypothyroidism.  Diagnoses and all orders for this visit:  Post concussive syndrome -     Ambulatory referral to Hospice  Dietary iron deficiency- Her H&H are normal now.  Her white cell count is elevated but this is likely related to dexamethasone. -     IBC + Ferritin; Future -     CBC with Differential/Platelet; Future -     CBC with Differential/Platelet -     IBC + Ferritin  Simple chronic bronchitis (HCC) -     Ambulatory referral to Hospice  Chronic respiratory failure with hypoxia ([place]) -     Ambulatory referral to Hospice  Acquired hypothyroidism- Her TSH is suppressed.  Will discontinue T4. -     TSH; Future -     TSH  Chronic heart failure with preserved ejection fraction ([place])- She has a normal volume status. -     Ambulatory referral to Hospice  Encounter for palliative care involving management of pain -     oxyCODONE ER (XTAMPZA ER) 9 MG C12A; Take 1 tablet by mouth 2 (two) times daily as needed.  Chronic bilateral low back pain without sciatica -     oxyCODONE ER (XTAMPZA ER) 9 MG C12A; Take 1 tablet by mouth 2 (two) times daily as needed.   I have discontinued Josilin Sether. Hon's levothyroxine, traMADol, and budesonide-formoterol. I am also having her start on Xtampza ER. Additionally, I am having her maintain her albuterol, Magnesium, azelastine, Vitamin D3, docusate sodium, cyanocobalamin, Yupelri, OXYGEN, acetaminophen, guaiFENesin, torsemide, rivaroxaban, Nexletol, febuxostat, liver oil-zinc oxide, nystatin cream, mupirocin ointment, losartan, dofetilide, and spironolactone.  Meds ordered this encounter  Medications   oxyCODONE ER (XTAMPZA ER) 9 MG C12A    Sig: Take 1 tablet  by mouth 2 (two) times daily as needed.    Dispense:  60 capsule    Refill:  0     Follow-up: Return in about 6 months (around 05/14/2022).  Scarlette Calico, MD

## 2021-11-19 ENCOUNTER — Ambulatory Visit (INDEPENDENT_AMBULATORY_CARE_PROVIDER_SITE_OTHER): Payer: Medicare Other

## 2021-11-19 ENCOUNTER — Ambulatory Visit (INDEPENDENT_AMBULATORY_CARE_PROVIDER_SITE_OTHER): Payer: Medicare Other | Admitting: Internal Medicine

## 2021-11-19 ENCOUNTER — Encounter: Payer: Self-pay | Admitting: Internal Medicine

## 2021-11-19 ENCOUNTER — Telehealth: Payer: Self-pay | Admitting: Internal Medicine

## 2021-11-19 VITALS — BP 124/72 | HR 73 | Temp 98.4°F | Ht 62.0 in | Wt 193.0 lb

## 2021-11-19 DIAGNOSIS — M10042 Idiopathic gout, left hand: Secondary | ICD-10-CM

## 2021-11-19 DIAGNOSIS — D510 Vitamin B12 deficiency anemia due to intrinsic factor deficiency: Secondary | ICD-10-CM | POA: Diagnosis not present

## 2021-11-19 DIAGNOSIS — R051 Acute cough: Secondary | ICD-10-CM

## 2021-11-19 DIAGNOSIS — M79642 Pain in left hand: Secondary | ICD-10-CM

## 2021-11-19 MED ORDER — CYANOCOBALAMIN 1000 MCG/ML IJ SOLN: 1000.0000 ug | INTRAMUSCULAR | 0 refills | Status: DC

## 2021-11-19 MED ORDER — METHYLPREDNISOLONE ACETATE 80 MG/ML IJ SUSP
80.0000 mg | Freq: Once | INTRAMUSCULAR | Status: AC
  Administered 2021-11-19: 80 mg via INTRAMUSCULAR

## 2021-11-19 MED ORDER — COLCHICINE 0.6 MG PO TABS: 0.6000 mg | ORAL_TABLET | Freq: Two times a day (BID) | ORAL | 1 refills | Status: DC

## 2021-11-19 NOTE — Telephone Encounter (Signed)
See MyChart message dated 6/14 in regard.

## 2021-11-19 NOTE — Telephone Encounter (Signed)
Pt called in and stated she thinks she has a bite on her knuckle near pinky on left hand ring finger.    Informed pt that note would be started and assistant will callback with recommendations.

## 2021-11-19 NOTE — Patient Instructions (Signed)
Gout  Gout is a condition that causes painful swelling of the joints. Gout is a type of inflammation of the joints (arthritis). This condition is caused by having too much uric acid in the body. Uric acid is a chemical that forms when the body breaks down substances called purines. Purines are important for building body proteins. When the body has too much uric acid, sharp crystals can form and build up inside the joints. This causes pain and swelling. Gout attacks can happen quickly and may be very painful (acute gout). Over time, the attacks can affect more joints and become more frequent (chronic gout). Gout can also cause uric acid to build up under the skin and inside the kidneys. What are the causes? This condition is caused by too much uric acid in your blood. This can happen because: Your kidneys do not remove enough uric acid from your blood. This is the most common cause. Your body makes too much uric acid. This can happen with some cancers and cancer treatments. It can also occur if your body is breaking down too many red blood cells (hemolytic anemia). You eat too many foods that are high in purines. These foods include organ meats and some seafood. Alcohol, especially beer, is also high in purines. A gout attack may be triggered by trauma or stress. What increases the risk? The following factors may make you more likely to develop this condition: Having a family history of gout. Being female and middle-aged. Being female and having gone through menopause. Taking certain medicines, including aspirin, cyclosporine, diuretics, levodopa, and niacin. Having an organ transplant. Having certain conditions, such as: Being obese. Lead poisoning. Kidney disease. A skin condition called psoriasis. Other factors include: Losing weight too quickly. Being dehydrated. Frequently drinking alcohol, especially beer. Frequently drinking beverages that are sweetened with a type of sugar called  fructose. What are the signs or symptoms? An attack of acute gout happens quickly. It usually occurs in just one joint. The most common place is the big toe. Attacks often start at night. Other joints that may be affected include joints of the feet, ankle, knee, fingers, wrist, or elbow. Symptoms of this condition may include: Severe pain. Warmth. Swelling. Stiffness. Tenderness. The affected joint may be very painful to touch. Shiny, red, or purple skin. Chills and fever. Chronic gout may cause symptoms more frequently. More joints may be involved. You may also have white or yellow lumps (tophi) on your hands or feet or in other areas near your joints. How is this diagnosed? This condition is diagnosed based on your symptoms, your medical history, and a physical exam. You may have tests, such as: Blood tests to measure uric acid levels. Removal of joint fluid with a thin needle (aspiration) to look for uric acid crystals. X-rays to look for joint damage. How is this treated? Treatment for this condition has two phases: treating an acute attack and preventing future attacks. Acute gout treatment may include medicines to reduce pain and swelling, including: NSAIDs, such as ibuprofen. Steroids. These are strong anti-inflammatory medicines that can be taken by mouth (orally) or injected into a joint. Colchicine. This medicine relieves pain and swelling when it is taken soon after an attack. It can be given by mouth or through an IV. Preventive treatment may include: Daily use of smaller doses of NSAIDs or colchicine. Use of a medicine that reduces uric acid levels in your blood, such as allopurinol. Changes to your diet. You may need to see   a dietitian about what to eat and drink to prevent gout. Follow these instructions at home: During a gout attack  If directed, put ice on the affected area. To do this: Put ice in a plastic bag. Place a towel between your skin and the bag. Leave the  ice on for 20 minutes, 2-3 times a day. Remove the ice if your skin turns bright red. This is very important. If you cannot feel pain, heat, or cold, you have a greater risk of damage to the area. Raise (elevate) the affected joint above the level of your heart as often as possible. Rest the joint as much as possible. If the affected joint is in your leg, you may be given crutches to use. Follow instructions from your health care provider about eating or drinking restrictions. Avoiding future gout attacks Follow a low-purine diet as told by your dietitian or health care provider. Avoid foods and drinks that are high in purines, including liver, kidney, anchovies, asparagus, herring, mushrooms, mussels, and beer. Maintain a healthy weight or lose weight if you are overweight. If you want to lose weight, talk with your health care provider. Do not lose weight too quickly. Start or maintain an exercise program as told by your health care provider. Eating and drinking Avoid drinking beverages that contain fructose. Drink enough fluids to keep your urine pale yellow. If you drink alcohol: Limit how much you have to: 0-1 drink a day for women who are not pregnant. 0-2 drinks a day for men. Know how much alcohol is in a drink. In the [place] one drink equals one 12 oz bottle of beer (355 mL), one 5 oz glass of wine (148 mL), or one 1 oz glass of hard liquor (44 mL). General instructions Take over-the-counter and prescription medicines only as told by your health care provider. Ask your health care provider if the medicine prescribed to you requires you to avoid driving or using machinery. Return to your normal activities as told by your health care provider. Ask your health care provider what activities are safe for you. Keep all follow-up visits. This is important. Where to find more information National Institutes of Health: www.niams.nih.gov Contact a health care provider if you have: Another  gout attack. Continuing symptoms of a gout attack after 10 days of treatment. Side effects from your medicines. Chills or a fever. Burning pain when you urinate. Pain in your lower back or abdomen. Get help right away if you: Have severe or uncontrolled pain. Cannot urinate. Summary Gout is painful swelling of the joints caused by having too much uric acid in the body. The most common site for gout to occur is in the big toe, but it can affect other joints in the body. Medicines and dietary changes can help to prevent and treat gout attacks. This information is not intended to replace advice given to you by your health care provider. Make sure you discuss any questions you have with your health care provider. Document Revised: 02/26/2021 Document Reviewed: 02/26/2021 Elsevier Patient Education  2023 [employer]  

## 2021-11-19 NOTE — Progress Notes (Signed)
Subjective:  Patient ID: Kathleen [name], female    DOB: [birth year]  Age: 76 y.o. MRN: [id number]  CC: Cough   HPI GENEAL FARANDA presents for f/up -  She complains of a 2-day history of nontraumatic pain, redness, and swelling in her left hand over the ring finger.  She also complains of persistent cough.  Outpatient Medications Prior to Visit  Medication Sig Dispense Refill   acetaminophen (TYLENOL) 650 MG CR tablet Take 1,300 mg by mouth as needed for pain (take with budesonide).     albuterol (PROVENTIL HFA;VENTOLIN HFA) 108 (90 Base) MCG/ACT inhaler Inhale 2 puffs into the lungs every 4 (four) hours as needed for wheezing or shortness of breath.     azelastine (ASTELIN) 0.1 % nasal spray Place 2 sprays into both nostrils 2 (two) times daily.     Bempedoic Acid (NEXLETOL) 180 MG TABS Take 1 tablet by mouth daily. (Patient taking differently: Take 180 mg by mouth at bedtime.) 90 tablet 1   Cholecalciferol (VITAMIN D3) 50 MCG (2000 UT) TABS Take 2,000 Units by mouth at bedtime.     docusate sodium (COLACE) 100 MG capsule Take 100 mg by mouth 2 (two) times daily.     dofetilide (TIKOSYN) 250 MCG capsule Take 1 capsule (250 mcg total) by mouth in the morning and at bedtime. 60 capsule 4   febuxostat (ULORIC) 40 MG tablet TAKE 1 TABLET(40 MG) BY MOUTH EVERY NIGHT (Patient taking differently: Take 40 mg by mouth at bedtime.) 90 tablet 1   losartan (COZAAR) 25 MG tablet TAKE 1 TABLET(25 MG) BY MOUTH AT BEDTIME (Patient taking differently: Take 25 mg by mouth at bedtime.) 90 tablet 3   Magnesium 500 MG TABS Take 500 mg by mouth at bedtime.     mupirocin ointment (BACTROBAN) 2 % Apply 1 application. topically 2 (two) times daily. 30 g 2   nystatin cream (MYCOSTATIN) Apply to affected area 2 times daily (Patient taking differently: Apply 1 application  topically 2 (two) times daily.) 90 g 1   oxyCODONE ER (XTAMPZA ER) 9 MG C12A Take 1 tablet by mouth 2 (two) times daily as needed. 60  capsule 0   OXYGEN Inhale 4 L into the lungs continuous. Use with resmed ventilator     rivaroxaban (XARELTO) 20 MG TABS tablet Take 1 tablet (20 mg total) by mouth daily with supper. 30 tablet 3   spironolactone (ALDACTONE) 25 MG tablet TAKE 1 TABLET(25 MG) BY MOUTH EVERY MORNING 90 tablet 3   torsemide (DEMADEX) 20 MG tablet Take 20mg  (1 Tab) Monday and Friday 60 tablet 3   YUPELRI 175 MCG/3ML nebulizer solution USE 1 VIAL IN NEBULIZER DAILY - DO NOT MIX WITH OTHER NEB MEDS,USE BEFORE OR AFTER (Patient taking differently: Take 175 mcg by nebulization daily.) 30 mL 11   cyanocobalamin (,VITAMIN B-12,) 1000 MCG/ML injection Inject 1 mL (1,000 mcg total) into the muscle every 30 (thirty) days. 10 mL 0   guaiFENesin (MUCINEX) 600 MG 12 hr tablet Take 600 mg by mouth 2 (two) times daily as needed for cough. (Patient not taking: Reported on 11/19/2021)     liver oil-zinc oxide (DESITIN) 40 % ointment Apply 1 application. topically as needed for irritation. (Patient not taking: Reported on 11/19/2021) 56.7 g 0   No facility-administered medications prior to visit.    ROS Review of Systems  Constitutional:  Positive for fatigue. Negative for appetite change, diaphoresis, fever and unexpected weight change.  HENT: Negative.  Eyes: Negative.   Respiratory:  Positive for shortness of breath. Negative for cough and wheezing.   Cardiovascular:  Negative for chest pain, palpitations and leg swelling.  Gastrointestinal:  Negative for abdominal pain and nausea.  Endocrine: Negative.   Genitourinary: Negative.   Musculoskeletal:  Positive for arthralgias, back pain and joint swelling.  Skin:  Positive for color change.  Neurological: Negative.  Negative for dizziness, weakness, light-headedness and headaches.  Hematological:  Negative for adenopathy. Does not bruise/bleed easily.  Psychiatric/Behavioral: Negative.      Objective:  BP 124/72 (BP Location: Right Arm, Patient Position: Sitting, Cuff  Size: Normal)   Pulse 73   Temp 98.4 F (36.9 C) (Oral)   Ht 5\' 2"  (1.575 m)   Wt 193 lb (87.5 kg)   SpO2 97%   BMI 35.30 kg/m   BP Readings from Last 3 Encounters:  11/19/21 124/72  11/12/21 126/76  11/06/21 126/74    Wt Readings from Last 3 Encounters:  11/19/21 193 lb (87.5 kg)  11/12/21 189 lb (85.7 kg)  11/06/21 190 lb (86.2 kg)    Physical Exam Vitals reviewed.  Constitutional:      General: She is not in acute distress.    Appearance: She is not ill-appearing, toxic-appearing or diaphoretic.  HENT:     Nose: Nose normal.     Mouth/Throat:     Mouth: Mucous membranes are moist.  Eyes:     General: No scleral icterus.    Conjunctiva/sclera: Conjunctivae normal.  Cardiovascular:     Rate and Rhythm: Normal rate and regular rhythm.     Heart sounds: No murmur heard. Pulmonary:     Effort: Pulmonary effort is normal.     Breath sounds: No stridor. No wheezing, rhonchi or rales.  Abdominal:     General: Abdomen is flat.     Palpations: There is no mass.     Tenderness: There is no abdominal tenderness. There is no guarding.     Hernia: No hernia is present.  Musculoskeletal:        General: Swelling and tenderness present.     Cervical back: Neck supple.     Right lower leg: No edema.     Left lower leg: No edema.  Lymphadenopathy:     Cervical: No cervical adenopathy.  Skin:    General: Skin is warm and dry.     Findings: Erythema present.  Neurological:     General: No focal deficit present.     Mental Status: Mental status is at baseline.  Psychiatric:        Mood and Affect: Mood normal.        Behavior: Behavior normal.     Lab Results  Component Value Date   WBC 19.0 Repeated and verified X2. (HH) 11/12/2021   HGB 13.2 11/12/2021   HCT 40.2 11/12/2021   PLT 285.0 11/12/2021   GLUCOSE 122 (H) 10/08/2021   CHOL 174 08/07/2021   TRIG 200.0 (H) 08/07/2021   HDL 35.00 (L) 08/07/2021   LDLCALC 99 08/07/2021   ALT 13 10/04/2021   AST 18  10/04/2021   NA 136 10/08/2021   K 4.6 10/08/2021   CL 103 10/08/2021   CREATININE 0.74 10/08/2021   BUN 17 10/08/2021   CO2 24 10/08/2021   TSH 0.20 (L) 11/12/2021   INR 1.4 (H) 10/04/2021   HGBA1C 5.9 08/07/2021   MICROALBUR 0.8 08/07/2021    CT HEAD WO CONTRAST (5MM)  Result Date: 10/05/2021 CLINICAL DATA:  Stroke, follow up EXAM: CT HEAD WITHOUT CONTRAST TECHNIQUE: Contiguous axial images were obtained from the base of the skull through the vertex without intravenous contrast. RADIATION DOSE REDUCTION: This exam was performed according to the departmental dose-optimization program which includes automated exposure control, adjustment of the mA and/or kV according to patient size and/or use of iterative reconstruction technique. COMPARISON:  10/04/2021 FINDINGS: Brain: Previously seen foci of hyperdensity are no longer identified. No new hemorrhage. No new loss of gray-white differentiation. Ventricles and sulci are stable in size and configuration. Vascular: No new finding. Skull: Calvarium is unremarkable. Sinuses/Orbits: No acute finding. Other: Posterior right scalp soft tissue swelling. IMPRESSION: Previously seen foci of hyperdensity are no longer identified. No new hemorrhage. Electronically Signed   By: Macy Mis M.D.   On: 10/05/2021 11:33   DG HIPS BILAT WITH PELVIS 3-4 VIEWS  Result Date: 10/05/2021 CLINICAL DATA:  76 year old female with severe right side pain. EXAM: DG HIP (WITH OR WITHOUT PELVIS) 3-4V BILAT COMPARISON:  Lumbar radiographs today. CT Abdomen and Pelvis 06/26/2019. FINDINGS: Femoral heads are normally located. Hip joint spaces appear symmetric and stable since 2021, relatively normal for age. No pelvis fracture identified. Grossly intact proximal femurs. Negative visible bowel gas. Left lower quadrant surgical clip is in the on the prior CT. Greater omentum IMPRESSION: No acute osseous abnormality identified. Hip joint spaces within normal limits for age.  Electronically Signed   By: Genevie Ann M.D.   On: 10/05/2021 11:13   DG Lumbar Spine 2-3 Views  Result Date: 10/05/2021 CLINICAL DATA:  76 year old female with right side low back pain. EXAM: LUMBAR SPINE - 2-3 VIEW COMPARISON:  CT Abdomen and Pelvis 06/26/2019. FINDINGS: Stable cholecystectomy clips. Mild levoconvex lumbar scoliosis. Normal lumbar segmentation. Stable lumbar lordosis since 2021 with mild grade anterolisthesis of L4 on L5. Mild to moderate lower lumbar facet hypertrophy. Stable disc spaces. No acute osseous abnormality identified. Calcified aortic atherosclerosis. Partially visible chronic cardiac pacemaker. Negative visible lung bases and bowel gas. IMPRESSION: 1. No acute osseous abnormality identified in the lumbar spine. 2. Chronic grade 1 anterolisthesis of L4 on L5 with facet arthropathy. 3.  Aortic Atherosclerosis (ICD10-I70.0). Electronically Signed   By: Genevie Ann M.D.   On: 10/05/2021 11:12   CT Cervical Spine Wo Contrast  Result Date: 10/04/2021 CLINICAL DATA:  Trauma EXAM: CT CERVICAL SPINE WITHOUT CONTRAST TECHNIQUE: Multidetector CT imaging of the cervical spine was performed without intravenous contrast. Multiplanar CT image reconstructions were also generated. RADIATION DOSE REDUCTION: This exam was performed according to the departmental dose-optimization program which includes automated exposure control, adjustment of the mA and/or kV according to patient size and/or use of iterative reconstruction technique. COMPARISON:  None. FINDINGS: Alignment: There is minimal 1-2 mm anterolisthesis at C4-C5 level. This may be due to previous ligament injury and facet degeneration. Skull base and vertebrae: No recent fracture is seen. Tiny smooth marginated calcifications adjacent to the anterior inferior aspects of bodies of C4 and C5 vertebrae may suggest ununited accessory ossification centers. Soft tissues and spinal canal: There is no central spinal stenosis. Disc levels: There is mild  encroachment of neural foramina at C3-C4 and C4-C5 levels. Upper chest: Centrilobular emphysema is seen. Other: There is inhomogeneous attenuation in the thyroid. IMPRESSION: No recent fracture is seen in the cervical spine. Minimal anterolisthesis at C4-C5 level may suggest previous ligament injury and facet degeneration. COPD.  There is inhomogeneous attenuation in the thyroid. Electronically Signed   By: Prudy Feeler.D.  On: 10/04/2021 17:40   CT Head Wo Contrast  Result Date: 10/04/2021 CLINICAL DATA:  Trauma, patient on anticoagulationregimen EXAM: CT HEAD WITHOUT CONTRAST TECHNIQUE: Contiguous axial images were obtained from the base of the skull through the vertex without intravenous contrast. RADIATION DOSE REDUCTION: This exam was performed according to the departmental dose-optimization program which includes automated exposure control, adjustment of the mA and/or kV according to patient size and/or use of iterative reconstruction technique. COMPARISON:  04/15/2021 FINDINGS: Brain: There are small foci of increased density in the left parietal cortex. This finding may suggest small foci of subarachnoid hemorrhage. There is no epidural or subdural fluid collection. There is no demonstrable blood within the ventricles. There is no shift of midline structures. Cortical sulci are prominent. Vascular: Unremarkable. Skull: No fracture is seen in the calvarium. There is subcutaneous hematoma in the right parietal scalp. Sinuses/Orbits: There is mild mucosal thickening in the ethmoid and right maxillary sinuses. There is possible air-fluid level in the left side of sphenoid sinus. Other: None IMPRESSION: There are small foci of subarachnoid hemorrhage in the left parietal lobe. There is no epidural or subdural hematoma. Atrophy. There is subcutaneous hematoma in the right parietal scalp. Sinusitis. Imaging findings were relayed to Dr. Dina Rich by telephone call. Electronically Signed   By: Elmer Picker M.D.   On: 10/04/2021 17:36    DG Hand Complete Left  Result Date: 11/19/2021 CLINICAL DATA:  Pain and swelling EXAM: LEFT HAND - COMPLETE 3+ VIEW COMPARISON:  None Available. FINDINGS: No recent fracture or dislocation is seen. Severe degenerative changes are noted in first carpometacarpal joint. Degenerative changes with bony spurs seen in the multiple interphalangeal joints and metacarpophalangeal joints. There is soft tissue swelling in the fingers especially the ring finger. There are no opaque foreign bodies. IMPRESSION: No fracture or dislocation is seen. Degenerative changes are noted in multiple joints, more severe in the first carpometacarpal joint. Electronically Signed   By: Elmer Picker M.D.   On: 11/19/2021 15:49   DG Chest 2 View  Result Date: 11/19/2021 CLINICAL DATA:  Cough. EXAM: CHEST - 2 VIEW COMPARISON:  09/03/2021. FINDINGS: Trachea is midline. Heart is enlarged, stable. Right atrium, right ventricle and left bundle branch pacemaker leads. CardioMEMS remote pulmonary arterial pressure monitoring device projects over the left hilum. Mild perihilar and bibasilar interstitial prominence and indistinctness. Probable trace bilateral pleural effusions. IMPRESSION: Probable mild congestive heart failure. Electronically Signed   By: Lorin Picket M.D.   On: 11/19/2021 14:46     Assessment & Plan:   Janaan was seen today for cough.  Diagnoses and all orders for this visit:  Acute cough- Her chest x-ray is negative for mass or infiltrate. -     DG Chest 2 View; Future  Left hand pain- Her symptoms, exam, and x-ray are consistent with acute gouty arthropathy.  She will continue oxycodone for pain and will treat the pain and inflammation with methylprednisolone and colchicine. -     DG Hand Complete Left; Future  Acute idiopathic gout of left hand -     DG Hand Complete Left; Future -     colchicine 0.6 MG tablet; Take 1 tablet (0.6 mg total) by mouth 2 (two)  times daily. -     methylPREDNISolone acetate (DEPO-MEDROL) injection 80 mg  Vitamin B12 deficiency anemia due to intrinsic factor deficiency -     cyanocobalamin (,VITAMIN B-12,) 1000 MCG/ML injection; Inject 1 mL (1,000 mcg total) into the muscle every 30 (thirty)  days.   I am having Franne Grip. Ratto start on colchicine. I am also having her maintain her albuterol, Magnesium, azelastine, Vitamin D3, docusate sodium, Yupelri, OXYGEN, acetaminophen, guaiFENesin, torsemide, rivaroxaban, Nexletol, febuxostat, liver oil-zinc oxide, nystatin cream, mupirocin ointment, losartan, dofetilide, spironolactone, Xtampza ER, and cyanocobalamin. We administered methylPREDNISolone acetate.  Meds ordered this encounter  Medications   colchicine 0.6 MG tablet    Sig: Take 1 tablet (0.6 mg total) by mouth 2 (two) times daily.    Dispense:  60 tablet    Refill:  1   methylPREDNISolone acetate (DEPO-MEDROL) injection 80 mg   cyanocobalamin (,VITAMIN B-12,) 1000 MCG/ML injection    Sig: Inject 1 mL (1,000 mcg total) into the muscle every 30 (thirty) days.    Dispense:  10 mL    Refill:  0     Follow-up: Return if symptoms worsen or fail to improve.  Scarlette Calico, MD

## 2021-11-19 NOTE — Telephone Encounter (Signed)
PT has an OV today at 2.20pm

## 2021-11-20 ENCOUNTER — Encounter: Payer: Self-pay | Admitting: Internal Medicine

## 2021-11-25 ENCOUNTER — Telehealth: Payer: Self-pay | Admitting: Internal Medicine

## 2021-11-25 NOTE — Telephone Encounter (Signed)
Pt has been informed and expressed understanding.  

## 2021-11-25 NOTE — Telephone Encounter (Signed)
Pt had a visit on 6.14.23 and thought she saw in her paperwork that she was supposed to stop taking her thyroid supplement. She has since stopped taking her Levothyroxine but wanted to make sure that was the right thing to do.  Should she stop taking the medication, or continue like normal? Please Advise.   Lurena Joiner: [phone]

## 2021-11-26 ENCOUNTER — Encounter (HOSPITAL_COMMUNITY): Payer: Self-pay | Admitting: Internal Medicine

## 2021-11-28 ENCOUNTER — Telehealth: Payer: Self-pay

## 2021-11-28 NOTE — Telephone Encounter (Signed)
Ok to proceed with just palliative at this time.

## 2021-11-28 NOTE — Telephone Encounter (Signed)
Has she had change in clinical situation recently? I see she was assessed by palliative care 11/11/21 with the expectation she did not have <6 month prognosis. I do not see any clear change in clinical condition documented in epic.

## 2021-11-28 NOTE — Telephone Encounter (Signed)
Can we speak with patient on her clinical status?

## 2021-12-01 ENCOUNTER — Encounter (HOSPITAL_COMMUNITY): Payer: Self-pay

## 2021-12-02 ENCOUNTER — Other Ambulatory Visit: Payer: Medicare Other | Admitting: Family Medicine

## 2021-12-02 ENCOUNTER — Encounter: Payer: Self-pay | Admitting: Family Medicine

## 2021-12-02 VITALS — HR 70 | Resp 18

## 2021-12-02 DIAGNOSIS — I5022 Chronic systolic (congestive) heart failure: Secondary | ICD-10-CM

## 2021-12-02 DIAGNOSIS — F0781 Postconcussional syndrome: Secondary | ICD-10-CM

## 2021-12-02 DIAGNOSIS — R7989 Other specified abnormal findings of blood chemistry: Secondary | ICD-10-CM

## 2021-12-02 DIAGNOSIS — J410 Simple chronic bronchitis: Secondary | ICD-10-CM

## 2021-12-02 DIAGNOSIS — M109 Gout, unspecified: Secondary | ICD-10-CM

## 2021-12-03 ENCOUNTER — Telehealth: Payer: Self-pay | Admitting: Family Medicine

## 2021-12-03 DIAGNOSIS — J441 Chronic obstructive pulmonary disease with (acute) exacerbation: Secondary | ICD-10-CM

## 2021-12-03 DIAGNOSIS — J4489 Other specified chronic obstructive pulmonary disease: Secondary | ICD-10-CM | POA: Insufficient documentation

## 2021-12-03 NOTE — Telephone Encounter (Signed)
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 Azithromycin 500 mg po day one,  250 mg po days 2-5. Prednisone 40 mg daily x 7 days. Increase warm fluids and rest. Tussin cough syrup per OTC directions  S:  TCF pt stating her symptoms are worse today with new chest congestion, increased sputum production and cought/hoarseness and worsening SOB. Pt states she coughed significantly last night and this kept her awake most of the night.  She states when she finally got to sleep and woke up she was noting increased SOB.  She has had more sputum production, yellow colored.  O:  Hoarse, barking cough noted. Mild SOB noted A & O x 3  Joycelyn Man FNP-C [facility]

## 2021-12-18 ENCOUNTER — Encounter: Payer: Self-pay | Admitting: Internal Medicine

## 2021-12-23 ENCOUNTER — Telehealth: Payer: Self-pay | Admitting: Family Medicine

## 2021-12-23 DIAGNOSIS — G8929 Other chronic pain: Secondary | ICD-10-CM

## 2021-12-23 DIAGNOSIS — F0781 Postconcussional syndrome: Secondary | ICD-10-CM

## 2021-12-23 DIAGNOSIS — R7989 Other specified abnormal findings of blood chemistry: Secondary | ICD-10-CM

## 2021-12-23 DIAGNOSIS — M10042 Idiopathic gout, left hand: Secondary | ICD-10-CM

## 2021-12-23 NOTE — Telephone Encounter (Signed)
TCF pt with multiple questions.  She is due for eye exam but has concerns that when she sees things that are rapid in sequence that it makes her vertigo worsen.  She is concerned that the Ophthalmologist will not get accurate eye exam with substances put in her eye.  Advised pt that the meds used to dilate eyes shouldn't interfere or change because of her postconcussive states however it may cause worsening of her vertigo symptoms so I would recommend waiting until she recovered some more before having eye exam.    She states the finger she had a dx of gout in from her PCP is starting to hurt and get red again so she thinks she may have stopped her Colchicine too soon. She does not want to take pain meds but does not know what else she can do for her finger.  She c/o pain in her low back, almost at her coccyx, more to the left side and states she has had continuous numbness in one area of her left leg since getting her concussion.  She states pain is worse when sitting or ambulating.  States when at [facility] for rehab she was put on a medication that starts with DEX... which she thinks was a steroid that helped with her pain and inflammation.  She has a hx of back pain, was seen previously at a pain clinic and was given ESI but does not think she wants to do that again nor does she really want to take pain meds.  She states that PCP told her to stop her synthroid but now she is feeling sluggish and tired so she thinks she may need to go back on meds.  Reminded her that she had too much hormone per her most recent TSH level and that she should likely have a repeat level and encouraged her to follow up with PCP.  She states having an appointment with the Cardiology clinic and wanting to know if they could check it.  Encouraged her to ask and see if they could do it and send to her PCP since she will be in their office.  Advised pt to use ice pack to back for 15 minutes TID and will send in a steroid dose  pack to help decrease inflammation which will coincidentally also help with gout flare. Have follow up visit next week. Joycelyn Man FNP-C

## 2021-12-24 ENCOUNTER — Telehealth: Payer: Self-pay | Admitting: Internal Medicine

## 2021-12-24 NOTE — Telephone Encounter (Signed)
Joselyn Glassman the physical therapist at [facility] is requesting verbal orders to continue home health services and speech therapy. Once a week for 8 weeks. He stated pt will be moving this week session to next week due to her having multiple appointments this week and family coming to visit her for her birthday.   Please advise   CB: [phone] Ok to leave a message.

## 2021-12-24 NOTE — Telephone Encounter (Signed)
Verbal orders given via VM 

## 2021-12-25 ENCOUNTER — Ambulatory Visit (HOSPITAL_COMMUNITY)
Admission: RE | Admit: 2021-12-25 | Discharge: 2021-12-25 | Disposition: A | Discharge status: Home / Self Care | Payer: Medicare Other | Source: Ambulatory Visit | Attending: Nurse Practitioner | Admitting: Nurse Practitioner

## 2021-12-25 ENCOUNTER — Encounter (HOSPITAL_COMMUNITY): Payer: Self-pay | Admitting: Nurse Practitioner

## 2021-12-25 VITALS — BP 130/80 | HR 71 | Ht 62.0 in | Wt 196.2 lb

## 2021-12-25 DIAGNOSIS — I110 Hypertensive heart disease with heart failure: Secondary | ICD-10-CM | POA: Insufficient documentation

## 2021-12-25 DIAGNOSIS — E669 Obesity, unspecified: Secondary | ICD-10-CM | POA: Diagnosis not present

## 2021-12-25 DIAGNOSIS — Z7901 Long term (current) use of anticoagulants: Secondary | ICD-10-CM | POA: Diagnosis not present

## 2021-12-25 DIAGNOSIS — D6869 Other thrombophilia: Secondary | ICD-10-CM

## 2021-12-25 DIAGNOSIS — I442 Atrioventricular block, complete: Secondary | ICD-10-CM | POA: Insufficient documentation

## 2021-12-25 DIAGNOSIS — I480 Paroxysmal atrial fibrillation: Secondary | ICD-10-CM | POA: Diagnosis not present

## 2021-12-25 DIAGNOSIS — I5022 Chronic systolic (congestive) heart failure: Secondary | ICD-10-CM | POA: Diagnosis not present

## 2021-12-25 DIAGNOSIS — J449 Chronic obstructive pulmonary disease, unspecified: Secondary | ICD-10-CM | POA: Diagnosis not present

## 2021-12-25 DIAGNOSIS — I4891 Unspecified atrial fibrillation: Secondary | ICD-10-CM | POA: Diagnosis present

## 2021-12-25 DIAGNOSIS — I4819 Other persistent atrial fibrillation: Secondary | ICD-10-CM

## 2021-12-25 DIAGNOSIS — J8489 Other specified interstitial pulmonary diseases: Secondary | ICD-10-CM | POA: Diagnosis not present

## 2021-12-25 LAB — BASIC METABOLIC PANEL
Anion gap: 10 (ref 5–15)
BUN: 27 mg/dL — ABNORMAL HIGH (ref 8–23)
CO2: 25 mmol/L (ref 22–32)
Calcium: 10.3 mg/dL (ref 8.9–10.3)
Chloride: 107 mmol/L (ref 98–111)
Creatinine, Ser: 1 mg/dL (ref 0.44–1.00)
GFR, Estimated: 59 mL/min — ABNORMAL LOW (ref >60)
Glucose, Bld: 125 mg/dL — ABNORMAL HIGH (ref 70–99)
Potassium: 4.3 mmol/L (ref 3.5–5.1)
Sodium: 142 mmol/L (ref 135–145)

## 2021-12-25 LAB — TSH: TSH: 2.949 u[IU]/mL (ref 0.350–4.500)

## 2021-12-25 LAB — MAGNESIUM: Magnesium: 2.4 mg/dL (ref 1.7–2.4)

## 2021-12-25 NOTE — Telephone Encounter (Signed)
Noted  

## 2021-12-25 NOTE — Progress Notes (Signed)
Primary Care Physician: [name], MD Referring Physician:  Dr. Johney Frame  Prior EP: Dr. Johney Frame AHF: Dr.Benshimon   Kathleen [name] is a 76 y.o. female with a h/o  obesity, BOOP, COPD, persistent  afib, and CHB s/p AV nodal ablation and PPM (MDT) by Dr Christin Fudge who presents today to establish care in the Electrophysiology device clinic  Per Dr. Jenel Lucks note of 05/2021,   She has had a challenging CV history.  She says that she was diagnosed with atrial fibrillation in 2014.  She took multaq but developed a rash.  She was placed on tikosyn but continued to have afib.  She appears to have had PVI + CTI by Dr Christin Fudge 2/15.  She states that this took 9 hours and was not successful.  She continued to have atrial arrhythmias.  She underwent AV nodal ablation and pacemaker implantation 11/02/15.  LV lead could not be placed however she received a CRT-P device.  Per Dr Isidore Moos recent note, it appears that the lead was not placed due to high LV lead thresholds.  She had return of her AV nodal conduction and required repeat AV nodal ablation by Dr Wilford Grist the following day.  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  She returned to EP lab and had the device placed submuscular Sept 2015.  Per Dr Donia Ast note (8/22), she had redo ablation 2019.    She was started on tikosyn 3/22 with improvement in her AF burden.The patient also had left bundle lead placed 05/09/2020.  She was hospitalized  for sepsis due to extensive chest wall cellulitis 12/22 at [facility]. She no longer is followed at [facility] as she moved out of the area.   She is in the afib clinic for Tikosyn surveillance. She is a sensed/v paced. She has not had any awareness of afib. She continues on tikosyn 250 mcg bid. No issues with anticoagulation.     She was placed on Tikosyn possibly last March 2022 at [facility]. She struggles with chronic lung disease. She is on CPAP and continuous O2 via  nasal cannula at 4 L /min  F/u in afib clinic 12/26/22 for Tikosyn surveillance. She is av paced. She reports that she had a fall with head trauma end of April with subarachnoid hemorrhage that resolved holding xarelto that  did not require reversal of xarelto. She also that TSH indicated hyperthyroidism at .20 and her replacement was stopped but now feels her symptoms of hypothyroidism have returned. Asking for a TSH to be drawn today. Last paceart report did not show any significant afib burden.   Today, she denies symptoms of palpitations, chest pain, shortness of breath, orthopnea, PND, lower extremity edema, dizziness, presyncope, syncope, or neurologic sequela. The patient is tolerating medications without difficulties and is otherwise without complaint today.   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HCC)    CHF (congestive heart failure) (HCC)    Chronic renal insufficiency    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    COPD (chronic obstructive pulmonary disease) (HCC)    Gout    Hypertension    Longstanding persistent atrial fibrillation (HCC)    Nonischemic cardiomyopathy (HCC)    Obesity    Pacemaker    Spontaneous pneumothorax 2013   Past Surgical History:  Procedure Laterality Date  ABDOMINAL HYSTERECTOMY     ATRIAL FIBRILLATION ABLATION  07/20/2013   by Dr Christin Fudge   AV nodal ablation  11/01/2013   by Dr Christin Fudge, repeated by Dr Wilford Grist   BREAST BIOPSY Bilateral 1997   negative   CARDIAC CATHETERIZATION     CHOLECYSTECTOMY     HERNIA REPAIR     PACEMAKER INSERTION  06/2017   MDT Viva CRT-P implanted by Dr Christin Fudge after AV nodal ablation,  LV lead could not be placed   PACEMAKER INSERTION  05/2020   with lead bundle    Current Outpatient Medications  Medication Sig Dispense Refill   acetaminophen (TYLENOL) 650 MG CR tablet Take 1,300 mg by mouth as needed  for pain (take with budesonide).     albuterol (PROVENTIL HFA;VENTOLIN HFA) 108 (90 Base) MCG/ACT inhaler Inhale 2 puffs into the lungs every 4 (four) hours as needed for wheezing or shortness of breath.     azelastine (ASTELIN) 0.1 % nasal spray Place 2 sprays into both nostrils 2 (two) times daily.     Bempedoic Acid (NEXLETOL) 180 MG TABS Take 1 tablet by mouth daily. (Patient taking differently: Take 180 mg by mouth at bedtime.) 90 tablet 1   Cholecalciferol (VITAMIN D3) 50 MCG (2000 UT) TABS Take 2,000 Units by mouth at bedtime.     colchicine 0.6 MG tablet Take 1 tablet (0.6 mg total) by mouth 2 (two) times daily. 60 tablet 1   cyanocobalamin (,VITAMIN B-12,) 1000 MCG/ML injection Inject 1 mL (1,000 mcg total) into the muscle every 30 (thirty) days. 10 mL 0   docusate sodium (COLACE) 100 MG capsule Take 100 mg by mouth 2 (two) times daily.     dofetilide (TIKOSYN) 250 MCG capsule Take 1 capsule (250 mcg total) by mouth in the morning and at bedtime. 60 capsule 4   febuxostat (ULORIC) 40 MG tablet TAKE 1 TABLET(40 MG) BY MOUTH EVERY NIGHT (Patient taking differently: Take 40 mg by mouth at bedtime.) 90 tablet 1   guaiFENesin (MUCINEX) 600 MG 12 hr tablet Take 600 mg by mouth 2 (two) times daily as needed for cough. (Patient not taking: Reported on 11/19/2021)     liver oil-zinc oxide (DESITIN) 40 % ointment Apply 1 application. topically as needed for irritation. (Patient not taking: Reported on 11/19/2021) 56.7 g 0   losartan (COZAAR) 25 MG tablet TAKE 1 TABLET(25 MG) BY MOUTH AT BEDTIME (Patient taking differently: Take 25 mg by mouth at bedtime.) 90 tablet 3   Magnesium 500 MG TABS Take 500 mg by mouth at bedtime.     mupirocin ointment (BACTROBAN) 2 % Apply 1 application. topically 2 (two) times daily. 30 g 2   nystatin cream (MYCOSTATIN) Apply to affected area 2 times daily (Patient taking differently: Apply 1 application  topically 2 (two) times daily.) 90 g 1   oxyCODONE ER (XTAMPZA ER) 9  MG C12A Take 1 tablet by mouth 2 (two) times daily as needed. 60 capsule 0   OXYGEN Inhale 4 L into the lungs continuous. Use with resmed ventilator     rivaroxaban (XARELTO) 20 MG TABS tablet Take 1 tablet (20 mg total) by mouth daily with supper. 30 tablet 3   spironolactone (ALDACTONE) 25 MG tablet TAKE 1 TABLET(25 MG) BY MOUTH EVERY MORNING 90 tablet 3   torsemide (DEMADEX) 20 MG tablet Take 20mg  (1 Tab) Monday and Friday 60 tablet 3   YUPELRI 175 MCG/3ML nebulizer solution USE 1 VIAL IN NEBULIZER DAILY - DO NOT MIX  WITH OTHER NEB MEDS,USE BEFORE OR AFTER (Patient taking differently: Take 175 mcg by nebulization daily.) 30 mL 11   No current facility-administered medications for this encounter.    Allergies  Allergen Reactions   Allopurinol Other (See Comments)    Reaction:  Dizziness    Clindamycin Anaphylaxis and Hives   Pneumococcal 13-Val Conj Vacc Itching, Swelling and Rash   Dronedarone Rash   Brovana [Arformoterol]     Caused muscle pain   Budesonide     Caused extreme joint pain   Entresto [Sacubitril-Valsartan] Other (See Comments)    hypotension   Fosamax [Alendronate Sodium] Nausea Only   Jardiance [Empagliflozin]     Caused a vaginal infection   Meperidine Nausea And Vomiting   Microplegia Msa-Msg [Plegisol]     Loopy,diarrhea   Rosuvastatin Other (See Comments)    Reaction:  Muscle spasms    Tetracycline Hives   Adhesive [Tape] Rash   Lovastatin Rash    Other reaction(s): Muscle Pain    Social History   Socioeconomic History   Marital status: Widowed    Spouse name: Not on file   Number of children: Not on file   Years of education: Not on file   Highest education level: Not on file  Occupational History   Occupation: retired  Tobacco Use   Smoking status: Former    Types: Cigarettes    Quit date: 06/09/1987    Years since quitting: 34.5   Smokeless tobacco: Never  Vaping Use   Vaping Use: Never used  Substance and Sexual Activity   Alcohol use:  No    Alcohol/week: 0.0 standard drinks of alcohol   Drug use: No   Sexual activity: Not Currently  Other Topics Concern   Not on file  Social History Narrative   Pt lives in [place] alone.  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  Her son works in [employer].   Social Determinants of Health   Financial Resource Strain: Low Risk  (07/26/2018)   Overall Financial Resource Strain (CARDIA)    Difficulty of Paying Living Expenses: Not hard at all  Food Insecurity: No Food Insecurity (07/26/2018)   Hunger Vital Sign    Worried About Running Out of Food in the Last Year: Never true    Ran Out of Food in the Last Year: Never true  Transportation Needs: No Transportation Needs (07/26/2018)   PRAPARE - Administrator, Civil Service (Medical): No    Lack of Transportation (Non-Medical): No  Physical Activity: Insufficiently Active (07/26/2018)   Exercise Vital Sign    Days of Exercise per Week: 2 days    Minutes of Exercise per Session: 20 min  Stress: Stress Concern Present (07/26/2018)   [employer] of Occupational Health - Occupational Stress Questionnaire    Feeling of Stress : Rather much  Social Connections: Socially Isolated (07/26/2018)   Social Connection and Isolation Panel [NHANES]    Frequency of Communication with Friends and Family: Once a week    Frequency of Social Gatherings with Friends and Family: Once a week    Attends Religious Services: Never    Database administrator or Organizations: No    Attends Banker Meetings: Never    Marital Status: Widowed  Intimate Partner Violence: Not At Risk (07/26/2018)   Humiliation, Afraid, Rape, and Kick questionnaire    Fear of Current or Ex-Partner: No    Emotionally Abused: No    Physically Abused:  No    Sexually Abused: No    Family History  Problem Relation Age of Onset   Breast cancer Mother 32       3 different times   Pancreatic cancer Mother    Breast cancer Cousin    Colon  cancer Neg Hx    Esophageal cancer Neg Hx    Stomach cancer Neg Hx    Inflammatory bowel disease Neg Hx    Liver disease Neg Hx    Rectal cancer Neg Hx     ROS- All systems are reviewed and negative except as per the HPI above  Physical Exam: There were no vitals filed for this visit.  Wt Readings from Last 3 Encounters:  11/19/21 87.5 kg  11/12/21 85.7 kg  11/06/21 86.2 kg    Labs: Lab Results  Component Value Date   NA 136 10/08/2021   K 4.6 10/08/2021   CL 103 10/08/2021   CO2 24 10/08/2021   GLUCOSE 122 (H) 10/08/2021   BUN 17 10/08/2021   CREATININE 0.74 10/08/2021   CALCIUM 9.6 10/08/2021   PHOS 3.7 06/27/2019   MG 1.9 10/07/2021   Lab Results  Component Value Date   INR 1.4 (H) 10/04/2021   Lab Results  Component Value Date   CHOL 174 08/07/2021   HDL 35.00 (L) 08/07/2021   LDLCALC 99 08/07/2021   TRIG 200.0 (H) 08/07/2021     GEN- The patient is well appearing, alert and oriented x 3 today.   Head- normocephalic, atraumatic Eyes-  Sclera clear, conjunctiva pink Ears- hearing intact Oropharynx- clear Neck- supple, no JVP Lymph- no cervical lymphadenopathy Lungs- Clear to ausculation bilaterally, normal work of breathing Heart- Regular rate and rhythm, no murmurs, rubs or gallops, PMI not laterally displaced GI- soft, NT, ND, + BS Extremities- no clubbing, cyanosis, or edema MS- no significant deformity or atrophy Skin- no rash or lesion Psych- euthymic mood, full affect Neuro- strength and sensation are intact  EKG Vent. rate 71 BPM PR interval 206 ms QRS duration 126 ms QT/QTcB 434/471 ms P-R-T axes 114 -51 95 AV dual-paced rhythm Abnormal ECG When compared with ECG of 03-Sep-2021 10:50, PREVIOUS ECG IS PRESENT No significant change since last tracing Confirmed by Nicki Guadalajara ([id number]) on 12/25/2021 11:59:54 AM    Assessment and Plan:  1. Afib Very complicated history (see HPI) prior care  at [facility] She is now living in the area and  no longer receiving car there She did reestablish with Dr. Johney Frame in December but he has left his position and will establish with Dr. Elberta Fortis in December.  No change in treatment  Continue dofetilide 250 mcg bid  Continue xarelto 20 mg qd for a  CHA2DS2VASc of 6 Bmet/mag/tsh today   2. Chronic systolic HF Per Dr. Gala Romney  Appears normovolemic  3. HTN Stable   4. Complete Heart Block S/p AV nodal ablation at [facility]  Per device clinic Has remote check 7/26  Will see back in 1 year  for Tikosyn surveillance  as she has appointment with Dr. Elberta Fortis to get established in December     Eppie Barhorst C[facility] Fol[facility] [address] [place], [place] [address] [phone]

## 2021-12-25 NOTE — Telephone Encounter (Signed)
Kathleen [name] from [facility] called, unaware that [facility] requested recertification of ST and HH.   Informed her that verbals were given. She will continue ST for 1x week- 4 weeks.

## 2021-12-29 ENCOUNTER — Other Ambulatory Visit (HOSPITAL_COMMUNITY): Payer: Self-pay

## 2021-12-29 MED ORDER — RIVAROXABAN 20 MG PO TABS: 20.0000 mg | ORAL_TABLET | Freq: Every day | ORAL | 5 refills | Status: DC

## 2021-12-30 ENCOUNTER — Other Ambulatory Visit: Payer: Medicare Other | Admitting: Family Medicine

## 2021-12-30 ENCOUNTER — Encounter: Payer: Self-pay | Admitting: Family Medicine

## 2021-12-30 VITALS — HR 66 | Resp 20

## 2021-12-30 DIAGNOSIS — M10042 Idiopathic gout, left hand: Secondary | ICD-10-CM

## 2021-12-30 DIAGNOSIS — F0781 Postconcussional syndrome: Secondary | ICD-10-CM

## 2021-12-30 DIAGNOSIS — G8929 Other chronic pain: Secondary | ICD-10-CM

## 2021-12-30 NOTE — Progress Notes (Signed)
[facility] Consult Note Telephone: [phone]  Fax: [phone]    Date of encounter: 12/30/21 10:31 AM PATIENT NAME: Kathleen [name] [address] [place] [address]   [phone] (home)  DOB: 02-Jul-1945 MRN: [id number] PRIMARY CARE PROVIDER:    [name], MD,  [place] [address] [phone]  REFERRING PROVIDER:   [name], MD [address] [place],   [address] [phone]  RESPONSIBLE PARTY:    Contact Information     Name Relation Home Work [place] Son [phone]  [phone]   Verania, [place] [phone]  [phone]   Alveria Apley Daughter   [phone]        I met face to face [place]. Palliative Care was asked to follow this patient by consultation request of  [name], MD to address advance care planning and complex medical decision making. This is a follow up visit.  ASSESSMENT, SYMPTOM MANAGEMENT AND PLAN / RECOMMENDATIONS:  Post concussive syndrome Continued headache and improving vertigo with HH PT vestibular therapy Encouraged to alternate periods of acute concentration with periods of rest to avoid headache.  2.  Chronic low back pain Some improvement with medrol dose pack, continues to have numbness of anterior right thigh/rectum but no incontinence. She has had recent steroids x 2 with acute COPD exacerbation and now with back pain. Advised that she may need referral to neurosurgery for evaluation and management of back pain if no significant improvement and/or pain clinic for further management Advised risks to continuing steroid treatments for blood sugar and vision which may outweight benefits.  3.   Acute gout, unspecified cause Decreased pain and edema in left ring finger with Medrol dose pack. Continue colchicine as prescribed.      Advance Care Planning/Goals of Care:  Goals include to maximize quality of life and symptom management.  CODE STATUS: Full Code at present   This visit was coded based on medical decision making (MDM).  PPS: 70%  HOSPICE ELIGIBILITY/DIAGNOSIS: TBD  Chief Complaint:  Palliative Care is following up for chronic medical management of chronic respiratory failure with COPD and chronic systolic heart failure.  HISTORY OF PRESENT ILLNESS:  Kathleen [name] is a 76 y.o. 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 Pain is not significantly improved in the 3 bones above her coccyx with more pain on left side.  Pain and edema in left hand improved.  Continues to be dizzy and is working with PT.  She states her being able to cook is improved. She was seen at afib clinic and was doing well. The big episode of dizziness has not happened since PT did the [place] manuevers but has small amounts of dizziness every day.  Has had to increase O2 to 4-5 L with activity recently with increased SOB.  No falls. Continues to cough mucous plugs but has some size to them that interferes with breathing. Constipation "all the time".  Can't feel pressure around rectum to know when she is passing a stool and right leg on anterior side are  numb.  Denies blood in stools.   History obtained from review of EMR, discussion with Kathleen [name].   12/25/21 BMP remarkable for elevated glucose 125 and BUN 27, low eGFR 59 with normal Cr 1.00. Magnesium normal at 2.4. TSH normal at 2.949.  EKG shows AV dual paced rhythm with no change from prior.  I reviewed available labs, medications, imaging,  studies and related documents from the EMR.  Records reviewed and summarized above.   ROS General: NAD EYES: denies vision changes Cardiovascular: denies chest pain, endorses DOE Pulmonary: cough mucous plugs Abdomen: endorses increased appetite, endorses chronic constipation but no further blood in stools, endorses continence of bowel GU: denies dysuria, endorses continence of urine MSK:  denies increased weakness, no falls reported Neurological: endorses pain in low back which she states not significantly better, endorses headache and daily dizziness, denies insomnia Psych: Endorses positive mood Heme/lymph/immuno: endorses bruising, no abnormal bleeding  Physical Exam: Current and past weights: 193 lbs today on home scale Constitutional: NAD General: WD, obese  CV: S1S2, RRR, no LE edema Pulmonary: CTAB except fine crackles in right base, no increased work of breathing, on 4 L O2 per [place] Abdomen: normo-active BS + 4 quadrants, soft and non tender MSK: ambulatory with rolling walker vs rollator Neuro:  PERRL Psych: non-anxious affect, A and O x 3    Thank you for the opportunity to participate in the care of [name].  The palliative care team will continue to follow. Please call our office at [phone] if we can be of additional assistance.   Marijo Conception, FNP-C  COVID-19 PATIENT SCREENING TOOL Asked and negative response unless otherwise noted:   Have you had symptoms of covid, tested positive or been in contact with someone with symptoms/positive test in the past 5-10 days?  No

## 2021-12-31 ENCOUNTER — Ambulatory Visit (INDEPENDENT_AMBULATORY_CARE_PROVIDER_SITE_OTHER): Payer: Medicare Other

## 2021-12-31 DIAGNOSIS — I480 Paroxysmal atrial fibrillation: Secondary | ICD-10-CM

## 2021-12-31 LAB — CUP PACEART REMOTE DEVICE CHECK
Battery Remaining Longevity: 99 mo
Battery Voltage: 2.99 V
Brady Statistic AP VP Percent: 73.33 %
Brady Statistic AP VS Percent: 0.01 %
Brady Statistic AS VP Percent: 26.63 %
Brady Statistic AS VS Percent: 0.02 %
Brady Statistic RA Percent Paced: 73.29 %
Brady Statistic RV Percent Paced: 99.96 %
Date Time Interrogation Session: 20230726080633
Implantable Lead Implant Date: 20150527
Implantable Lead Implant Date: 20211221
Implantable Lead Location: 753858
Implantable Lead Location: 753860
Implantable Lead Model: 3830
Implantable Lead Model: 5076
Implantable Pulse Generator Implant Date: 20211221
Lead Channel Impedance Value: 323 Ohm
Lead Channel Impedance Value: 323 Ohm
Lead Channel Impedance Value: 342 Ohm
Lead Channel Impedance Value: 342 Ohm
Lead Channel Impedance Value: 399 Ohm
Lead Channel Impedance Value: 437 Ohm
Lead Channel Impedance Value: 437 Ohm
Lead Channel Impedance Value: 494 Ohm
Lead Channel Impedance Value: 513 Ohm
Lead Channel Pacing Threshold Amplitude: 0.625 V
Lead Channel Pacing Threshold Amplitude: 0.625 V
Lead Channel Pacing Threshold Amplitude: 0.75 V
Lead Channel Pacing Threshold Pulse Width: 0.4 ms
Lead Channel Pacing Threshold Pulse Width: 0.4 ms
Lead Channel Pacing Threshold Pulse Width: 0.4 ms
Lead Channel Sensing Intrinsic Amplitude: 1.625 mV
Lead Channel Sensing Intrinsic Amplitude: 1.625 mV
Lead Channel Sensing Intrinsic Amplitude: 15.25 mV
Lead Channel Sensing Intrinsic Amplitude: 15.25 mV
Lead Channel Setting Pacing Amplitude: 1.25 V
Lead Channel Setting Pacing Amplitude: 1.5 V
Lead Channel Setting Pacing Amplitude: 2 V
Lead Channel Setting Pacing Pulse Width: 0.4 ms
Lead Channel Setting Pacing Pulse Width: 0.4 ms
Lead Channel Setting Sensing Sensitivity: 2 mV

## 2022-01-08 DIAGNOSIS — I484 Atypical atrial flutter: Secondary | ICD-10-CM

## 2022-01-08 DIAGNOSIS — Z9981 Dependence on supplemental oxygen: Secondary | ICD-10-CM

## 2022-01-08 DIAGNOSIS — I700 Atherosclerosis of aorta: Secondary | ICD-10-CM

## 2022-01-08 DIAGNOSIS — M109 Gout, unspecified: Secondary | ICD-10-CM

## 2022-01-08 DIAGNOSIS — E785 Hyperlipidemia, unspecified: Secondary | ICD-10-CM

## 2022-01-08 DIAGNOSIS — M47816 Spondylosis without myelopathy or radiculopathy, lumbar region: Secondary | ICD-10-CM

## 2022-01-08 DIAGNOSIS — I4819 Other persistent atrial fibrillation: Secondary | ICD-10-CM

## 2022-01-08 DIAGNOSIS — R911 Solitary pulmonary nodule: Secondary | ICD-10-CM

## 2022-01-08 DIAGNOSIS — Z7901 Long term (current) use of anticoagulants: Secondary | ICD-10-CM

## 2022-01-08 DIAGNOSIS — M4807 Spinal stenosis, lumbosacral region: Secondary | ICD-10-CM | POA: Diagnosis not present

## 2022-01-08 DIAGNOSIS — E039 Hypothyroidism, unspecified: Secondary | ICD-10-CM

## 2022-01-08 DIAGNOSIS — M4316 Spondylolisthesis, lumbar region: Secondary | ICD-10-CM

## 2022-01-08 DIAGNOSIS — I130 Hypertensive heart and chronic kidney disease with heart failure and stage 1 through stage 4 chronic kidney disease, or unspecified chronic kidney disease: Secondary | ICD-10-CM

## 2022-01-08 DIAGNOSIS — I5042 Chronic combined systolic (congestive) and diastolic (congestive) heart failure: Secondary | ICD-10-CM

## 2022-01-08 DIAGNOSIS — Z87891 Personal history of nicotine dependence: Secondary | ICD-10-CM

## 2022-01-08 DIAGNOSIS — Z950 Presence of cardiac pacemaker: Secondary | ICD-10-CM

## 2022-01-08 DIAGNOSIS — I428 Other cardiomyopathies: Secondary | ICD-10-CM

## 2022-01-08 DIAGNOSIS — J9611 Chronic respiratory failure with hypoxia: Secondary | ICD-10-CM

## 2022-01-08 DIAGNOSIS — M48061 Spinal stenosis, lumbar region without neurogenic claudication: Secondary | ICD-10-CM | POA: Diagnosis not present

## 2022-01-08 DIAGNOSIS — J449 Chronic obstructive pulmonary disease, unspecified: Secondary | ICD-10-CM | POA: Diagnosis not present

## 2022-01-08 DIAGNOSIS — Z6838 Body mass index (BMI) 38.0-38.9, adult: Secondary | ICD-10-CM

## 2022-01-08 DIAGNOSIS — M5136 Other intervertebral disc degeneration, lumbar region: Secondary | ICD-10-CM

## 2022-01-08 DIAGNOSIS — Z9181 History of falling: Secondary | ICD-10-CM

## 2022-01-08 DIAGNOSIS — S066XAD Traumatic subarachnoid hemorrhage with loss of consciousness status unknown, subsequent encounter: Secondary | ICD-10-CM | POA: Diagnosis not present

## 2022-01-08 DIAGNOSIS — N183 Chronic kidney disease, stage 3 unspecified: Secondary | ICD-10-CM

## 2022-01-08 DIAGNOSIS — M4312 Spondylolisthesis, cervical region: Secondary | ICD-10-CM

## 2022-01-08 DIAGNOSIS — Z8701 Personal history of pneumonia (recurrent): Secondary | ICD-10-CM

## 2022-01-08 DIAGNOSIS — K5730 Diverticulosis of large intestine without perforation or abscess without bleeding: Secondary | ICD-10-CM

## 2022-01-08 DIAGNOSIS — E662 Morbid (severe) obesity with alveolar hypoventilation: Secondary | ICD-10-CM

## 2022-01-14 ENCOUNTER — Telehealth: Payer: Self-pay | Admitting: Internal Medicine

## 2022-01-14 NOTE — Telephone Encounter (Signed)
Tyler from [facility] states that the pt is consistently reporting headaches and dizziness. They ruled out vertigo & hypertension. Frances Furbish is concerned their may be a serious underlying problem and want [place] to refer the pt to a neuro-center.  For any questions please call Judyann Munson: [phone]

## 2022-01-15 NOTE — Telephone Encounter (Signed)
LVM for Joselyn Glassman to please call us back with what type of referral is needed per Dr. Yetta Barre. Is the pt needing Neurology or Neurosurgery referral?

## 2022-01-15 NOTE — Telephone Encounter (Signed)
Spoke to [place], referral would be for Neurology

## 2022-01-18 NOTE — Progress Notes (Unsigned)
Office Visit Note  Patient: Kathleen [name]             Date of Birth: 07-01-1945           MRN: [id number]             PCP: [name], MD Referring: Dolores Patty, MD Visit Date: 01/19/2022   Subjective:  Gout (Bilateral feet, left hand.)   History of Present Illness: Kathleen [name] is a 76 y.o. female here for evaluation and management of chronic gout.  She has had gouty arthritis ongoing for a few years now most often affecting the right foot.  She saw a rheumatology provider in [place] for management more recently just being treated with her primary care office.  She has previously taken allopurinol stopped due to concern about allergic reaction.  She was on maintenance colchicine for flare prevention but had stopped this due to lack of efficacy.  More recently was prescribed colchicine during severe flareup of pain in her left hand with no obvious clinical response.  She was treated with probenecid apparently stopped due to poor clinical response, not sure if this was uric acid or other parameters.  More recently appears to have been treated with uloric since 2020 but with increased frequency and duration of gout flares in the past year. She was admitted to the hospital in December apparently with sepsis secondary to some chest wall cellulitis.  During his hospital stay she developed right and then the left-sided foot swelling and severe pain.  She also had a recurrence of the right foot symptoms within a few days after hospital discharge.  This was treated with prednisone with clinical improvement.  Symptoms remained okay with her feet but in June developed sudden left fourth PIP joint pain swelling and erythema.  This was thought to represent a new gout flare and treated with oral colchicine.  Symptoms are ongoing for about a month before resolving on their own she did not see any difference when taking the medicine.  She has been taking her subluxes at 40 mg daily  consistently throughout this time.  Right now joints are pretty much all back to baseline.  Activities of Daily Living:  Patient reports morning stiffness for 2-3 hours.   Patient Denies nocturnal pain.  Difficulty dressing/grooming: Denies Difficulty climbing stairs: Reports Difficulty getting out of chair: Reports Difficulty using hands for taps, buttons, cutlery, and/or writing: Reports  Review of Systems  Constitutional:  Positive for fatigue.  HENT:  Positive for mouth dryness. Negative for mouth sores.   Eyes:  Negative for dryness.  Respiratory:  Positive for shortness of breath.   Cardiovascular:  Negative for chest pain and palpitations.  Gastrointestinal:  Positive for constipation. Negative for blood in stool and diarrhea.  Endocrine: Negative for increased urination.  Genitourinary:  Negative for involuntary urination.  Musculoskeletal:  Positive for joint pain, joint pain, myalgias, morning stiffness and myalgias. Negative for joint swelling, muscle weakness and muscle tenderness.  Skin:  Negative for color change, rash, hair loss and sensitivity to sunlight.  Allergic/Immunologic: Positive for susceptible to infections.  Neurological:  Positive for dizziness and headaches.  Hematological:  Negative for swollen glands.  Psychiatric/Behavioral:  Positive for sleep disturbance. Negative for depressed mood. The patient is not nervous/anxious.     PMFS History:  Patient Active Problem List   Diagnosis Date Noted   COPD with acute exacerbation (HCC) 12/03/2021   Low TSH level 12/02/2021   Acute cough  11/19/2021   Left hand pain 11/19/2021   Acute idiopathic gout of left hand 11/19/2021   Acquired hypothyroidism 11/12/2021   Encounter for palliative care involving management of pain 11/12/2021   Post concussive syndrome 11/11/2021   Chronic left-sided low back pain 11/04/2021   Gout 10/01/2021   Hyperlipidemia with target LDL less than 100 08/07/2021   Chronic  anticoagulation 07/07/2021   Stage 3a chronic kidney disease ([place]) 05/23/2021   Dietary iron deficiency 05/23/2021   Age-related osteoporosis without current pathological fracture 04/22/2021   Type 2 diabetes mellitus with diabetic neuropathy, without long-term current use of insulin ([place]) 04/22/2021   Chronic idiopathic constipation 04/22/2021   Sleep apnea 01/22/2021   (HFpEF) heart failure with preserved ejection fraction ([place]) [id number]   Chronic systolic heart failure ([place]) 04/06/2018   HTN (hypertension) 04/06/2018   COPD (chronic obstructive pulmonary disease) ([place]) 04/06/2018   Atrial fibrillation ([place]) 04/06/2018   Chronic respiratory failure with hypoxia ([place]) 03/28/2018   Malnutrition of moderate degree 09/11/2015    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[place])    CHF (congestive heart failure) ([place])    Chronic renal insufficiency    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    Concussion 10/04/2021   COPD (chronic obstructive pulmonary disease) (HCC)    Gout    Hypertension    Longstanding persistent atrial fibrillation (HCC)    Nonischemic cardiomyopathy ([place])    Obesity    Pacemaker    Spontaneous pneumothorax 2013    Family History  Problem Relation Age of Onset   Breast cancer Mother 2       3 different times   Pancreatic cancer Mother    Emphysema Father    Healthy Brother    Breast cancer Cousin    Valvular heart disease Son    Obesity Daughter    Colon cancer Neg Hx    Esophageal cancer Neg Hx    Stomach cancer Neg Hx    Inflammatory bowel disease Neg Hx    Liver disease Neg Hx    Rectal cancer Neg Hx    Past Surgical History:  Procedure Laterality Date   ABDOMINAL HYSTERECTOMY     APPENDECTOMY     ATRIAL FIBRILLATION ABLATION  07/20/2013   by Dr Westley Gambles   AV nodal ablation  11/01/2013   by Dr Westley Gambles, repeated by Dr Dwana Curd    BREAST BIOPSY Bilateral 1997   negative   CARDIAC CATHETERIZATION     CHOLECYSTECTOMY     HERNIA REPAIR     PACEMAKER INSERTION  06/2017   MDT Viva CRT-P implanted by Dr Westley Gambles after AV nodal ablation,  LV lead could not be placed   PACEMAKER INSERTION  05/2020   with lead bundle   Social History   Social History Narrative   Pt lives in [place] alone.  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  Her son works in [employer].   Immunization History  Administered Date(s) Administered   Fluad Quad(high Dose 65+) 04/22/2021   Hepatitis A, Adult 04/12/2001   PFIZER Comirnaty(Gray Top)Covid-19 Tri-Sucrose Vaccine 03/29/2020, 10/09/2020   PFIZER(Purple Top)SARS-COV-2 Vaccination 07/13/2019, 08/08/2019   Pneumococcal Conjugate-13 08/30/2014   Pneumococcal Polysaccharide-23 03/31/2019   Tdap 10/02/2013   Yellow Fever 04/12/2001     Objective: Vital Signs: BP 108/69 (BP Location: Left Arm, Patient Position: Sitting, Cuff Size: Normal)  Pulse 70   Resp 15   Ht 5' 1.5" (1.562 m)   Wt 197 lb 3.2 oz (89.4 kg)   BMI 36.66 kg/m    Physical Exam Constitutional:      Appearance: She is obese.  Cardiovascular:     Rate and Rhythm: Normal rate and regular rhythm.  Pulmonary:     Breath sounds: Normal breath sounds.     Comments: On supplemental oxygen by nasal cannula Musculoskeletal:     Right lower leg: No edema.     Left lower leg: No edema.  Skin:    General: Skin is warm and dry.     Comments: Friable skin with few minor bruises on extensor surfaces  Neurological:     Mental Status: She is alert.  Psychiatric:        Mood and Affect: Mood normal.     Musculoskeletal Exam:  Right shoulder abduction limited to less than horizontal, tenderness with lateral and posterior palpation and with resisted external rotation Elbows full ROM no tenderness or swelling Wrists full ROM no tenderness or swelling Fingers full ROM, no palpable synovitis Knees full ROM right knee  with anterior and joint line tenderness to palpation, patellofemoral crepitus present, no effusion Ankles full ROM no tenderness or swelling MTPs full ROM no tenderness or swelling   Investigation: No additional findings.  Imaging: CUP PACEART REMOTE DEVICE CHECK  Result Date: 12/31/2021 Scheduled remote reviewed. Normal device function.  No episodes recorded during monitoring period. BiV Pacing 100%, Effective Pacing 99.9%. Next remote 91 days. MC   Recent Labs: Lab Results  Component Value Date   WBC 19.0 Repeated and verified X2. (HH) 11/12/2021   HGB 13.2 11/12/2021   PLT 285.0 11/12/2021   NA 142 12/25/2021   K 4.3 12/25/2021   CL 107 12/25/2021   CO2 25 12/25/2021   GLUCOSE 125 (H) 12/25/2021   BUN 27 (H) 12/25/2021   CREATININE 1.00 12/25/2021   BILITOT 0.7 10/04/2021   ALKPHOS 31 (L) 10/04/2021   AST 18 10/04/2021   ALT 13 10/04/2021   PROT 6.2 (L) 10/04/2021   ALBUMIN 3.7 10/04/2021   CALCIUM 10.3 12/25/2021   GFRAA 58 (L) 06/28/2019    Speciality Comments: No specialty comments available.  Procedures:  No procedures performed Allergies: Allopurinol, Clindamycin, Pneumococcal 13-val conj vacc, Dronedarone, Brovana [arformoterol], Budesonide, Entresto [sacubitril-valsartan], Fosamax [alendronate sodium], Jardiance [empagliflozin], Meperidine, Microplegia msa-msg [plegisol], Rosuvastatin, Tetracycline, Adhesive [tape], and Lovastatin   Assessment / Plan:     Visit Diagnoses: Chronic gout without tophus, unspecified cause, unspecified site - Plan: Uric acid, CBC with Differential/Platelet  Symptoms look well controlled today but has had a number of flareups.  Suspect the multiple episodes earlier this year were related to her sepsis and hospitalization.  Rechecking uric acid and blood counts today.  If uric acid is at goal continue febuxostat 40 mg daily would not recommend long-term colchicine for prophylaxis due to lack of improvement also drug interactions with  her cardiovascular regimen.  She cannot tolerate allopurinol due to hypersensitivity reaction and had lack of clinical response to probenecid with repeat flares and uric acid remaining above goal also with renal impairment.  Stage 3a chronic kidney disease (HCC)  Appears stable but probably the major contributor to her gout.  Discussed relationship of impaired uric acid excretion with development of gout and why she needs long term medical management unlikely to be diet controlled although low purine diet also helpful.  Acute idiopathic gout of left hand  Not completely sure whether the hand inflammation was gout since this was an new location for her with very prolonged inflammation.  She does describe what sounds like postinflammatory desquamation around the PIP joint which would be characteristic.  No residual changes we can just follow-up as needed for this.  Orders: Orders Placed This Encounter  Procedures   Uric acid   CBC with Differential/Platelet   No orders of the defined types were placed in this encounter.    Follow-Up Instructions: Return in about 6 months (around 07/22/2022) for Gout on uloric f/u 58mos.   [name], MD  Note - This record has been created using [employer].  Chart creation errors have been sought, but may not always  have been located. Such creation errors do not reflect on  the standard of medical care.

## 2022-01-19 ENCOUNTER — Ambulatory Visit: Payer: Medicare Other | Attending: Internal Medicine | Admitting: Internal Medicine

## 2022-01-19 ENCOUNTER — Encounter: Payer: Self-pay | Admitting: Internal Medicine

## 2022-01-19 VITALS — BP 108/69 | HR 70 | Resp 15 | Ht 61.5 in | Wt 197.2 lb

## 2022-01-19 DIAGNOSIS — M1A9XX0 Chronic gout, unspecified, without tophus (tophi): Secondary | ICD-10-CM | POA: Diagnosis present

## 2022-01-19 DIAGNOSIS — N1831 Chronic kidney disease, stage 3a: Secondary | ICD-10-CM | POA: Insufficient documentation

## 2022-01-19 DIAGNOSIS — M10042 Idiopathic gout, left hand: Secondary | ICD-10-CM | POA: Insufficient documentation

## 2022-01-20 LAB — CBC WITH DIFFERENTIAL/PLATELET
Absolute Monocytes: 590 cells/uL (ref 200–950)
Basophils Absolute: 41 cells/uL (ref 0–200)
Basophils Relative: 0.5 %
Eosinophils Absolute: 172 cells/uL (ref 15–500)
Eosinophils Relative: 2.1 %
HCT: 37 % (ref 35.0–45.0)
Hemoglobin: 12.2 g/dL (ref 11.7–15.5)
Lymphs Abs: 951 cells/uL (ref 850–3900)
MCH: 28.3 pg (ref 27.0–33.0)
MCHC: 33 g/dL (ref 32.0–36.0)
MCV: 85.8 fL (ref 80.0–100.0)
MPV: 10 fL (ref 7.5–12.5)
Monocytes Relative: 7.2 %
Neutro Abs: 6445 cells/uL (ref 1500–7800)
Neutrophils Relative %: 78.6 %
Platelets: 247 10*3/uL (ref 140–400)
RBC: 4.31 10*6/uL (ref 3.80–5.10)
RDW: 14.1 % (ref 11.0–15.0)
Total Lymphocyte: 11.6 %
WBC: 8.2 10*3/uL (ref 3.8–10.8)

## 2022-01-20 LAB — URIC ACID: Uric Acid, Serum: 5.4 mg/dL (ref 2.5–7.0)

## 2022-01-20 NOTE — Progress Notes (Signed)
Uric acid looks well controlled at 5.4 no need to change her febuxostat dose continue 40 mg once daily.  I do not think she needs to be taking anymore colchicine.  Can follow-up in about 6 months or they can reach out let us know if starts having any flareup.

## 2022-01-23 NOTE — Progress Notes (Signed)
Carelink Summary Report / Loop Recorder 

## 2022-01-26 ENCOUNTER — Encounter: Payer: Self-pay | Admitting: Pulmonary Disease

## 2022-01-27 ENCOUNTER — Other Ambulatory Visit: Payer: Medicare Other | Admitting: Family Medicine

## 2022-01-27 VITALS — HR 72 | Resp 20

## 2022-01-27 DIAGNOSIS — F0781 Postconcussional syndrome: Secondary | ICD-10-CM

## 2022-01-27 DIAGNOSIS — J441 Chronic obstructive pulmonary disease with (acute) exacerbation: Secondary | ICD-10-CM

## 2022-01-27 NOTE — Telephone Encounter (Signed)
Spk to patient, provider recommendation given   Nothing further

## 2022-01-27 NOTE — Progress Notes (Signed)
[facility] Palliative Care Consult Note Telephone: [phone]  Fax: [phone]    Date of encounter: 01/27/22 10:23 AM PATIENT NAME: Kathleen [name] [address] [name] [address]   [phone] (home)  DOB: 1945/10/04 MRN: [id number] PRIMARY CARE PROVIDER:    [name], MD,  [place] [address] [phone]  REFERRING PROVIDER:   [name], MD [address] [place],  [place] [address] [phone]  RESPONSIBLE PARTY:    Contact Information     Name Relation Home Work [place] Son [phone]  [phone]   Jezabelle, [place] [phone]  [phone]   Alveria Apley Daughter   [phone]        I met face to face [place] in [place]. Palliative Care was asked to follow this patient by consultation request of  [name], MD to address advance care planning and complex medical decision making. This is a follow up visit.  ASSESSMENT, SYMPTOM MANAGEMENT AND PLAN / RECOMMENDATIONS:  Post concussive syndrome Continued headache, vertigo resolved with [facility] PT vestibular therapy Requesting referral to neurology  2.    Acute bronchitis Azithromycin dose pack Albuterol nebs inhale 1 neb Q 6 hrs prn for increased wheezing SOB, Prednisone 40 mg daily x 7 days.  Take with food.        Advance Care Planning/Goals of Care: Goals include to maximize quality of life and symptom management.  CODE STATUS: Full Code at present   This visit was coded based on medical decision making (MDM).  PPS: 70%  HOSPICE ELIGIBILITY/DIAGNOSIS: TBD  Chief Complaint:  Palliative Care is following for chronic management in setting of COPD with worsening SOB and cough.  HISTORY OF PRESENT ILLNESS:  Kathleen [name] is a 76 y.o. year old female with hx of traumatic head injury  from fall in April 2023 resulting in subarachnoid hemorrhage, concussion and post  concussive syndrome. 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  Coughing up dark yellow, has chills, unsure about fever.  Using O2@ 4L/min with increased SOB.  Having joint pain with Symbicort.  Current weight 192 on home scale this am.  Appetite is down.  Has dizziness. Has Kathleen [name] brand cough syrup which she states has worked for her before about 6 hours.  She got her Prolia shot. Currently not taking any thyroid medicine for now with TSH normal at 2.949.  She is taking Yupelri nebulizers. Not having vertigo now with PT exercises.  No falls.  Has some dizziness and continues to have headaches. She continues to be symptomatic from her concussion and wants to follow up with Neurology and states she has been unable to get referral from PCP office.     History obtained from review of EMR, discussion with Kathleen [name].    I reviewed  EMR and there are no available labs, imaging, studies or related documents since last visit.   ROS General: NAD EYES: denies vision changes Cardiovascular: denies chest pain, endorses DOE Pulmonary: Increased cough productive of yellow mucous Abdomen: endorses poor appetite, endorses continence of bowel MSK:  endorses myalgias widespread, no falls reported Neurological: endorses pain in low back which she states not significantly better, endorses headache and daily dizziness, denies insomnia  Psych: Endorses positive mood   Physical Exam: Current and past weights: 193 lbs today on home scale Constitutional: NAD General: WD, obese  CV: S1S2, RRR, no LE edema Pulmonary: Expiratory wheeze in RLL, increased inspiratory time, no increased work of breathing, on 4 L O2 per [place] Abdomen: normo-active BS + 4 quadrants, soft and non  tender MSK: ambulatory with rolling walker vs rollator Neuro:  PERRL Psych: non-anxious affect, A and O x 3    Thank you for the opportunity to participate in the care of [name].  The palliative care team will continue to follow. Please call our office at [phone] if we can be of additional assistance.   Kathleen Conception, FNP-C  COVID-19 PATIENT SCREENING TOOL Asked and negative response unless otherwise noted:   Have you had symptoms of covid, tested positive or been in contact with someone with symptoms/positive test in the past 5-10 days?  No

## 2022-02-02 ENCOUNTER — Encounter: Payer: Self-pay | Admitting: Family Medicine

## 2022-02-10 ENCOUNTER — Other Ambulatory Visit: Payer: Self-pay | Admitting: Internal Medicine

## 2022-02-10 DIAGNOSIS — F0781 Postconcussional syndrome: Secondary | ICD-10-CM

## 2022-02-17 ENCOUNTER — Encounter: Payer: Self-pay | Admitting: Pulmonary Disease

## 2022-02-17 ENCOUNTER — Ambulatory Visit (INDEPENDENT_AMBULATORY_CARE_PROVIDER_SITE_OTHER): Payer: Medicare Other | Admitting: Pulmonary Disease

## 2022-02-17 VITALS — BP 118/78 | HR 88 | Ht 61.5 in | Wt 194.8 lb

## 2022-02-17 DIAGNOSIS — J9611 Chronic respiratory failure with hypoxia: Secondary | ICD-10-CM | POA: Diagnosis not present

## 2022-02-17 DIAGNOSIS — J410 Simple chronic bronchitis: Secondary | ICD-10-CM

## 2022-02-17 DIAGNOSIS — G4730 Sleep apnea, unspecified: Secondary | ICD-10-CM | POA: Diagnosis not present

## 2022-02-17 DIAGNOSIS — J441 Chronic obstructive pulmonary disease with (acute) exacerbation: Secondary | ICD-10-CM | POA: Diagnosis not present

## 2022-02-17 MED ORDER — FLUTICASONE-SALMETEROL 115-21 MCG/ACT IN AERO: 2.0000 | INHALATION_SPRAY | Freq: Two times a day (BID) | RESPIRATORY_TRACT | 5 refills | Status: DC

## 2022-02-17 MED ORDER — AZELASTINE HCL 0.1 % NA SOLN: 2.0000 | Freq: Two times a day (BID) | NASAL | 5 refills | Status: DC

## 2022-02-17 MED ORDER — AMOXICILLIN-POT CLAVULANATE 875-125 MG PO TABS: 1.0000 | ORAL_TABLET | Freq: Two times a day (BID) | ORAL | 0 refills | Status: DC

## 2022-02-17 NOTE — Assessment & Plan Note (Signed)
Treating with a course of Augmentin.  She is recently completed courses of prednisone

## 2022-02-17 NOTE — Assessment & Plan Note (Signed)
She should ideally be on triple therapy but has been unable to tolerate budesonide due to joint pains.  She will continue on Yupelri and have asked her to take Advair instead of Symbicort.  She prefers HFA and we will send in prescription for Advair HFA 115. I will treat her for an exacerbation with a short course of Augmentin given yellow sputum production. Refills will be provided on azelastine

## 2022-02-17 NOTE — Patient Instructions (Signed)
X Rx  advair HFA - 2 puffs twice daily  X Rx Augmentin 875 bid x 7 days  X Refill azelastine  X dc NIV Rx for BiPAP 12/8 to [facility] with O2 blended in

## 2022-02-17 NOTE — Progress Notes (Signed)
   Subjective:    Patient ID: Kathleen [name], female    DOB: 05-02-1946, 76 y.o.   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 Renaee Munda at [facility] interventional pulmonary and felt not to be a candidate for stent placement for tracheobronchomalacia    PMH - BOOP in 2013 Chronic diastolic HF, persistent AF s/p AVN ablation and pacemaker implant 2017 (has failed upgrade to BiV).    Meds -She was switched from an inhaler regimen in 2022 to budesonide/Brovana and Yupelri combination -did not tolerate budesonide/Brovana due to joint pains   Last hospitalization 11/2020 for pneumonia, respiratory failure She had a fall with concussion 10/04/2021 Last office visit 11/2021, she felt a [place] was helping, obtains this from [facility].  Did not tolerate budesonide, so I switched her to Symbicort however she could not tolerate this either.  She has some expired albuterol and asks if this is okay to take. She reports persistent cough especially last night was very bad this is difficult to expectorate but she was finally able to bring up some yellow sputum and felt better.  She has had to increase oxygen up to 5 L, needs home PT and saturation has been documented at 92%.  She has a palliative care nurse, and was prescribed a Z-Pak with prednisone last month, felt better while she was on it and then symptoms got worse again  I have reviewed palliative care and rheumatology notes where she was treated for gout.  I also reviewed cardiology report.  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 Her peak HR was 103. She walked a total of 42m which was 25% predicted     Cleda Daub 01/2021 severe airway obstruction, ratio 59, FEV1 0.95/48%, FVC 1.61/64%  Review of Systems neg for any significant sore throat, dysphagia, itching, sneezing, nasal congestion or excess/ purulent secretions, fever, chills, sweats, unintended wt loss, pleuritic or exertional cp, hempoptysis, orthopnea pnd or change in chronic leg swelling. Also denies presyncope, palpitations, heartburn, abdominal pain, nausea, vomiting, diarrhea or change in bowel or urinary habits, dysuria,hematuria, rash, arthralgias, visual complaints, headache, numbness weakness or ataxia.     Objective:   Physical Exam  Gen. Pleasant, obese, in no distress ENT - no lesions, no post nasal drip Neck: No JVD, no thyromegaly, no carotid bruits Lungs: no use of accessory muscles, no dullness to percussion, decreased without rales or rhonchi  Cardiovascular: Rhythm regular, heart sounds  normal, no murmurs or gallops, no peripheral edema Musculoskeletal: No deformities, no cyanosis or clubbing , no tremors       Assessment & Plan:

## 2022-02-17 NOTE — Assessment & Plan Note (Signed)
Continue oxygen as needed to maintain saturation above 88%. She is unable to tolerate NIV due to noise and oxygen issues and we will switch her to BiPAP 12/8 cm based on her previous titration study

## 2022-02-18 NOTE — Telephone Encounter (Signed)
Patient called again regarding her Advair.  She stated she sent a mychart message as well.  Patient is waiting for a response.  

## 2022-02-19 NOTE — Telephone Encounter (Signed)
Dr. Vassie Loll, please advise on pt's message regarding Advair. Thank you!

## 2022-02-19 NOTE — Telephone Encounter (Signed)
Patient called again regarding her Advair.  She stated she sent a mychart message as well.  Patient is waiting for a response.

## 2022-02-24 ENCOUNTER — Other Ambulatory Visit (HOSPITAL_COMMUNITY): Payer: Self-pay

## 2022-02-24 NOTE — Telephone Encounter (Signed)
Test claims return the following:  Advair HFA - requires PA Advair Diskus -$105.45 Breo - requires PA [facility] - requires PA  Running Airduo Respi through insurance brings back $27.70 co-pay. Also mentions patient may be in coverage gap.

## 2022-02-24 NOTE — Telephone Encounter (Signed)
Prior auth team - please run BIV for ICS/LABA (Advair, Breo or Dulera).   She did not tolerate Symbicort   Airduo is always an option via [employer]  Knox Saliva, PharmD, MPH, BCPS, CPP Clinical Pharmacist (Rheumatology and Pulmonology)

## 2022-02-27 ENCOUNTER — Telehealth: Payer: Self-pay

## 2022-02-27 DIAGNOSIS — M1A9XX0 Chronic gout, unspecified, without tophus (tophi): Secondary | ICD-10-CM

## 2022-02-27 MED ORDER — PREDNISONE 10 MG PO TABS: ORAL_TABLET | ORAL | 0 refills | Status: AC

## 2022-02-27 NOTE — Telephone Encounter (Signed)
FYI-I spoke with Kathleen [name] pain swelling and erythema recurred on the left foot starting since about 2 days ago.  This is slightly different than her previous gout flare she describes the pain is less severe does not involve the ankle which was the typical site for involvement.  I recommend we still treat this with a short course of prednisone as suspected gout flareup.  If she is not seeing a complete resolution of symptoms then we could plan a follow-up to investigate further and see if there is some other ongoing process.

## 2022-02-27 NOTE — Telephone Encounter (Signed)
Patient called stating she is having a gout flare up and would like a Rx to help. Please advise, thanks!

## 2022-02-27 NOTE — Addendum Note (Signed)
Addended by: [name] on: 02/27/2022 02:17 PM   Modules accepted: Orders

## 2022-03-03 ENCOUNTER — Encounter: Payer: Self-pay | Admitting: Family Medicine

## 2022-03-03 ENCOUNTER — Other Ambulatory Visit: Payer: Medicare Other | Admitting: Family Medicine

## 2022-03-03 VITALS — HR 78 | Resp 20

## 2022-03-03 DIAGNOSIS — H1031 Unspecified acute conjunctivitis, right eye: Secondary | ICD-10-CM | POA: Insufficient documentation

## 2022-03-03 DIAGNOSIS — F0781 Postconcussional syndrome: Secondary | ICD-10-CM

## 2022-03-03 DIAGNOSIS — I5033 Acute on chronic diastolic (congestive) heart failure: Secondary | ICD-10-CM

## 2022-03-03 DIAGNOSIS — J441 Chronic obstructive pulmonary disease with (acute) exacerbation: Secondary | ICD-10-CM

## 2022-03-03 NOTE — Progress Notes (Signed)
[facility] Palliative Care Consult Note Telephone: [phone]  Fax: [phone]    Date of encounter: 03/03/22 11:05 AM PATIENT NAME: Kathleen [name] [address] [place] [address]   [phone] (home)  DOB: 1945-09-20 MRN: [id number] PRIMARY CARE PROVIDER:    [name], MD,  [place] [address] [phone]  REFERRING PROVIDER:   [name], MD [address] [place],  [place] [address] [phone]  RESPONSIBLE PARTY:    Contact Information     Name Relation Home Work Kathleen [name] [phone]  [phone]   Kathleen, [name] [phone]  [phone]   Alveria Apley Daughter   [phone]        I met [place] to face [place]. Palliative Care was asked to follow this patient by consultation request of  [name], MD to address advance care planning and complex medical decision making. This is a follow up visit.  ASSESSMENT, SYMPTOM MANAGEMENT AND PLAN / RECOMMENDATIONS: Heart Failure with preserved EF Continue Cozaar 25 mg QHS, Spironolactone 25 mg daily Continue daily weight, if weigh is increased by 3 lbs/24 hrs or 5 lbs in 5 days she needs to take an extra Torsemide 20 mg tablet on Wed in addition to daily dose that is given on Monday and Friday.  2.  Post concussive syndrome Continued headache, intermittent vertigo. Can take some antivert as previously prescribed. Can take Tylenol at start of headache, decrease visual and noise stimulation. If develops fever will need to contact PCP.  3.   Acute conjunctivitis vs blood vessel rupture Zaditor 1 gtt to left eye BID x 7 days. If doesn't resolve, have eye pain with movement, blurry vision or foreign body sensation then need to see an ophthalmologist.  4.    Acute bronchitis exacerbation Complete Augmentin and steroids as prescribed       Advance Care Planning/Goals of Care:  Goals include to maximize quality of life and symptom management.  CODE STATUS: Full Code at present   This visit was coded based on medical decision making (MDM).  PPS: 70%  HOSPICE ELIGIBILITY/DIAGNOSIS: TBD  Chief Complaint:  Palliative Care is following for chronic medical management of pt with chronic respiratory failure in setting of COPD with chronic CHF.  HISTORY OF PRESENT ILLNESS:  Kathleen [name] is a 76 y.o. 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  [employer] is not covering her Advair inhaler and she said the pharmacy didn't have all she spent on medications on the list. She saw Pulmonologist on 02/17/22, was having a flare and he started her on Augmentin instead of Azithromycin and steroids.  She is currently on O2 at 4.5 L/min and trying to wean down. Cough was productive of dark yellow phlegm.  She was seeing Dr Rayann Heman in the EP lab but he has left.  She states her weight can go up by 2-3 lbs then 1 lb and by the end of 5 days she has gone up by 5 pounds.  She states weighing 193 lbs on last Monday, 198 lbs on Sunday. Has  had some chest pain and smothering sensation when laying down.  Has pain in right temple. Has not been driving since April when she fell.   Frequently has 2 bands of headache on posterior right parietal area and occipital area.  Having some persistent dizziness. Can't bend over because she is dizzy. PT stopped as of Monday. She has gotten an appt with [facility] on 03/12/22.   Scleral injection on right side outer canthus.  States on awakening on Sunday the sclera  on that side was red on both sides of the iris.  She thinks this occurred as a result of prolonged reading. She denies having coughing before redness of sclera. Vision is not changed and has no pain with eye movement. Has had some nausea.   She c/o constipation and states she takes Linzess 146 mg but can go 10 days between Bms.    Having gout in her left foot and had to be restarted on Prednisone which has helped.       History obtained from review of EMR, discussion with Ms. Abdo.    I reviewed  EMR and there are no available labs, imaging, studies or related documents since last visit.   ROS General: NAD EYES: denies vision changes Cardiovascular: denies chest pain, endorses DOE Pulmonary: Increased cough productive of yellow mucous Abdomen: endorses poor appetite, endorses continence of bowel MSK:  endorses myalgias widespread, no falls reported Neurological: endorses pain in low back which she states not significantly better, endorses headache and daily dizziness, denies insomnia Psych: Endorses positive mood   Physical Exam: Current and past weights: 193 lbs today on home scale Constitutional: NAD General: WD, obese  Eye:  Injection of right sclera at outer canthus CV: S1S2, RRR, 1+ L pedal edema. No right temporal bruit Pulmonary: CTAB with coarse dry cough, on 4.5 L O2 per Wallace Abdomen: normo-active BS + 4 quadrants, soft and non tender MSK: ambulatory with rollator Neuro:  Mild right temporoparietal tenderness Psych: tangential thought process, pressured speech. A and O x 3    Thank you for the opportunity to participate in the care of [name].  The palliative care team will continue to follow. Please call our office at [phone] if we can be of additional assistance.   Marijo Conception, FNP-C  COVID-19 PATIENT SCREENING TOOL Asked and negative response unless otherwise noted:   Have you had symptoms of covid, tested positive or been in contact with someone  with symptoms/positive test in the past 5-10 days?  Has had symptoms but no known sick contacts.

## 2022-03-05 ENCOUNTER — Other Ambulatory Visit: Payer: Self-pay | Admitting: Internal Medicine

## 2022-03-12 ENCOUNTER — Encounter: Payer: Self-pay | Admitting: Neurology

## 2022-03-12 ENCOUNTER — Ambulatory Visit (INDEPENDENT_AMBULATORY_CARE_PROVIDER_SITE_OTHER): Payer: Medicare Other | Admitting: Neurology

## 2022-03-12 VITALS — BP 123/69 | HR 84 | Ht 61.0 in | Wt 194.0 lb

## 2022-03-12 DIAGNOSIS — R269 Unspecified abnormalities of gait and mobility: Secondary | ICD-10-CM | POA: Diagnosis not present

## 2022-03-12 DIAGNOSIS — F0781 Postconcussional syndrome: Secondary | ICD-10-CM

## 2022-03-12 DIAGNOSIS — S060XAD Concussion with loss of consciousness status unknown, subsequent encounter: Secondary | ICD-10-CM | POA: Diagnosis not present

## 2022-03-12 DIAGNOSIS — G44229 Chronic tension-type headache, not intractable: Secondary | ICD-10-CM

## 2022-03-12 MED ORDER — AMITRIPTYLINE HCL 25 MG PO TABS: 25.0000 mg | ORAL_TABLET | Freq: Every day | ORAL | 3 refills | Status: DC

## 2022-03-12 NOTE — Progress Notes (Signed)
[facility]  PATIENT: Kathleen [name] DOB: [birth year]  REQUESTING CLINICIAN: [name], MD HISTORY FROM: Patient  REASON FOR VISIT: Fall, post traumatic headaches, post concussive syndrome    HISTORICAL  CHIEF COMPLAINT:  Chief Complaint  Patient presents with   New Patient (Initial Visit)    NP internal referral for Post concussive syndrome C/o headaches, dizziness, blurry vision     HISTORY OF PRESENT ILLNESS:  This is a 76 year old woman past medical history of COPD, atrial fibrillation on anticoagulation and CHF who is presenting for postconcussive syndrome.  Patient reports having a fall back in April.  Patient states she was sitting on her rollator, she got up and lost control of the walker then she fell and and hit her head on a concrete block.  She is not sure how long she was out but she had severe headaches then.  She was taken to the ED and the initial head CT showed subarachnoid hemorrhage.  Since then she had experienced initially severe vertigo and severe headache.  She has completed vestibular therapy, vertigo is resolved but she still having periods of lightheadedness that she described as unsteadiness.  In terms of the headaches she still having headaches, on average 4 headaches per week, headaches are described as throbbing pain and sometimes bandlike pain that is across her head.  Currently she has not been taking any medication for headaches. She reports on September 21, again in her apartment she has episode of unsteadiness but luckily for her she fell on her bed. Since the accident, she is having difficulty following instructions, she has back pain for which she takes tramadol nightly, she has blurry vision after reading 30 minutes and then she has to stop and also complain of decreased hearing on the right side.  She does report low energy.  Currently she is not doing physical therapy at her assisted living facility, she complaints of low  energy but she does walk from her room to the diner.    OTHER MEDICAL CONDITIONS: COPD, Atrial fibrillation, CHF   REVIEW OF SYSTEMS: Full 14 system review of systems performed and negative with exception of: As noted in the HPI   ALLERGIES: Allergies  Allergen Reactions   Allopurinol Other (See Comments)    Reaction:  Dizziness    Clindamycin Anaphylaxis and Hives   Pneumococcal 13-Val Conj Vacc Itching, Swelling and Rash   Dronedarone Rash   Brovana [Arformoterol]     Caused muscle pain   Budesonide     Caused extreme joint pain   Entresto [Sacubitril-Valsartan] Other (See Comments)    hypotension   Fosamax [Alendronate Sodium] Nausea Only   Jardiance [Empagliflozin]     Caused a vaginal infection   Meperidine Nausea And Vomiting   Microplegia Msa-Msg [Plegisol]     Loopy,diarrhea   Rosuvastatin Other (See Comments)    Reaction:  Muscle spasms    Tetracycline Hives   Adhesive [Tape] Rash   Lovastatin Rash    Other reaction(s): Muscle Pain    HOME MEDICATIONS: Outpatient Medications Prior to Visit  Medication Sig Dispense Refill   acetaminophen (TYLENOL) 650 MG CR tablet Take 1,300 mg by mouth as needed for pain (take with budesonide).     azelastine (ASTELIN) 0.1 % nasal spray Place 2 sprays into both nostrils 2 (two) times daily. 30 mL 5   Bempedoic Acid (NEXLETOL) 180 MG TABS Take 1 tablet by mouth daily. 90 tablet 1   Cholecalciferol (VITAMIN D3) 50 MCG (  2000 UT) TABS Take 2,000 Units by mouth at bedtime.     cyanocobalamin (,VITAMIN B-12,) 1000 MCG/ML injection Inject 1 mL (1,000 mcg total) into the muscle every 30 (thirty) days. 10 mL 0   docusate sodium (COLACE) 100 MG capsule Take 100 mg by mouth 2 (two) times daily.     dofetilide (TIKOSYN) 250 MCG capsule Take 1 capsule (250 mcg total) by mouth in the morning and at bedtime. 60 capsule 4   febuxostat (ULORIC) 40 MG tablet TAKE 1 TABLET(40 MG) BY MOUTH EVERY NIGHT 90 tablet 0   losartan (COZAAR) 25 MG tablet  TAKE 1 TABLET(25 MG) BY MOUTH AT BEDTIME 90 tablet 3   Magnesium 500 MG TABS Take 500 mg by mouth at bedtime.     mupirocin ointment (BACTROBAN) 2 % Apply 1 application. topically 2 (two) times daily. 30 g 2   nystatin cream (MYCOSTATIN) Apply to affected area 2 times daily 90 g 1   OXYGEN Inhale 4 L into the lungs continuous. Use with resmed ventilator     rivaroxaban (XARELTO) 20 MG TABS tablet Take 1 tablet (20 mg total) by mouth daily with supper. 30 tablet 5   spironolactone (ALDACTONE) 25 MG tablet TAKE 1 TABLET(25 MG) BY MOUTH EVERY MORNING 90 tablet 3   torsemide (DEMADEX) 20 MG tablet Take 20mg  (1 Tab) Monday and Friday 60 tablet 3   YUPELRI 175 MCG/3ML nebulizer solution USE 1 VIAL IN NEBULIZER DAILY - DO NOT MIX WITH OTHER NEB MEDS,USE BEFORE OR AFTER 30 mL 11   amoxicillin-clavulanate (AUGMENTIN) 875-125 MG tablet Take 1 tablet by mouth 2 (two) times daily. 14 tablet 0   fluticasone-salmeterol (ADVAIR HFA) 115-21 MCG/ACT inhaler Inhale 2 puffs into the lungs 2 (two) times daily. 8 g 5   guaiFENesin (MUCINEX) 600 MG 12 hr tablet Take 600 mg by mouth 2 (two) times daily as needed for cough.     oxyCODONE ER (XTAMPZA ER) 9 MG C12A Take 1 tablet by mouth 2 (two) times daily as needed. 60 capsule 0   No facility-administered medications prior to visit.    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    Concussion 10/04/2021   COPD (chronic obstructive pulmonary disease) (HCC)    Gout    Hypertension    Longstanding persistent atrial fibrillation (HCC)    Nonischemic cardiomyopathy (HCC)    Obesity    Pacemaker    Spontaneous pneumothorax 2013    PAST SURGICAL HISTORY: Past Surgical History:  Procedure Laterality Date    ABDOMINAL HYSTERECTOMY     APPENDECTOMY     ATRIAL FIBRILLATION ABLATION  07/20/2013   by Dr 09/17/2013   AV nodal ablation  11/01/2013   by Dr 11/03/2013, repeated by Dr Christin Fudge   BREAST BIOPSY Bilateral 1997   negative   CARDIAC CATHETERIZATION     CHOLECYSTECTOMY     HERNIA REPAIR     PACEMAKER INSERTION  06/2017   MDT Viva CRT-P implanted by Dr 07/2017 after AV nodal ablation,  LV lead could not be placed   PACEMAKER INSERTION  05/2020   with lead bundle    FAMILY HISTORY: Family History  Problem Relation Age of Onset   Breast cancer Mother 33  3 different times   Pancreatic cancer Mother    Emphysema Father    Healthy Brother    Breast cancer Cousin    Valvular heart disease Son    Obesity Daughter    Colon cancer Neg Hx    Esophageal cancer Neg Hx    Stomach cancer Neg Hx    Inflammatory bowel disease Neg Hx    Liver disease Neg Hx    Rectal cancer Neg Hx     SOCIAL HISTORY: Social History   Socioeconomic History   Marital status: Widowed    Spouse name: Not on file   Number of children: Not on file   Years of education: Not on file   Highest education level: Not on file  Occupational History   Occupation: retired  Tobacco Use   Smoking status: Former    Packs/day: 1.00    Years: 20.00    Total pack years: 20.00    Types: Cigarettes    Quit date: 06/09/1987    Years since quitting: 34.7    Passive exposure: Past   Smokeless tobacco: Never   Tobacco comments:    Former smoker 12/25/21  Vaping Use   Vaping Use: Never used  Substance and Sexual Activity   Alcohol use: No    Alcohol/week: 0.0 standard drinks of alcohol   Drug use: No   Sexual activity: Not Currently  Other Topics Concern   Not on file  Social History Narrative   Pt lives in [place] alone.  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  Her son works in [employer].   Social Determinants of Health   Financial Resource Strain: Low Risk  (07/26/2018)   Overall Financial  Resource Strain (CARDIA)    Difficulty of Paying Living Expenses: Not hard at all  Food Insecurity: No Food Insecurity (07/26/2018)   Hunger Vital Sign    Worried About Running Out of Food in the Last Year: Never true    Ran Out of Food in the Last Year: Never true  Transportation Needs: No Transportation Needs (07/26/2018)   PRAPARE - Administrator, Civil Service (Medical): No    Lack of Transportation (Non-Medical): No  Physical Activity: Insufficiently Active (07/26/2018)   Exercise Vital Sign    Days of Exercise per Week: 2 days    Minutes of Exercise per Session: 20 min  Stress: Stress Concern Present (07/26/2018)   [employer] of Occupational Health - Occupational Stress Questionnaire    Feeling of Stress : Rather much  Social Connections: Socially Isolated (07/26/2018)   Social Connection and Isolation Panel [NHANES]    Frequency of Communication with Friends and Family: Once a week    Frequency of Social Gatherings with Friends and Family: Once a week    Attends Religious Services: Never    Database administrator or Organizations: No    Attends Banker Meetings: Never    Marital Status: Widowed  Intimate Partner Violence: Not At Risk (07/26/2018)   Humiliation, Afraid, Rape, and Kick questionnaire    Fear of Current or Ex-Partner: No    Emotionally Abused: No    Physically Abused: No    Sexually Abused: No    PHYSICAL EXAM   GENERAL EXAM/CONSTITUTIONAL: Vitals:  Vitals:   03/12/22 1546  BP: 123/69  Pulse: 84  Weight: 194 lb (88 kg)  Height: 5\' 1"  (1.549 m)   Body mass index is 36.66 kg/m. Wt Readings from Last 3 Encounters:  03/12/22 194 lb (88 kg)  02/17/22 194 lb 12.8 oz (88.4 kg)  01/19/22 197 lb 3.2 oz (89.4 kg)   Patient is in no distress; well developed, nourished and groomed; neck is supple, She uses supplemental oxygen for her COPD  EYES: Pupils round and reactive to light, Visual fields full to confrontation, Extraocular  movements intacts,   MUSCULOSKELETAL: Gait, strength, tone, movements noted in Neurologic exam below  NEUROLOGIC: MENTAL STATUS:      No data to display         awake, alert, oriented to person, place and time recent and remote memory intact normal attention and concentration language fluent, comprehension intact, naming intact fund of knowledge appropriate  CRANIAL NERVE:  2nd, 3rd, 4th, 6th - pupils equal and reactive to light, visual fields full to confrontation, extraocular muscles intact, no nystagmus 5th - facial sensation symmetric 7th - facial strength symmetric 8th - hearing intact 9th - palate elevates symmetrically, uvula midline 11th - shoulder shrug symmetric 12th - tongue protrusion midline  MOTOR:  normal bulk and tone, full strength in the BUE, BLE  SENSORY:  normal and symmetric to light touch  COORDINATION:  finger-nose-finger, fine finger movements normal  GAIT/STATION:  Walk with a rollator     DIAGNOSTIC DATA (LABS, IMAGING, TESTING) - I reviewed patient records, labs, notes, testing and imaging myself where available.  Lab Results  Component Value Date   WBC 8.2 01/19/2022   HGB 12.2 01/19/2022   HCT 37.0 01/19/2022   MCV 85.8 01/19/2022   PLT 247 01/19/2022      Component Value Date/Time   NA 142 12/25/2021 1210   NA 130 (L) 12/28/2012 0748   K 4.3 12/25/2021 1210   K 4.6 12/28/2012 0748   CL 107 12/25/2021 1210   CL 95 (L) 12/28/2012 0748   CO2 25 12/25/2021 1210   CO2 23 12/28/2012 0748   GLUCOSE 125 (H) 12/25/2021 1210   GLUCOSE 391 (H) 12/28/2012 0748   BUN 27 (H) 12/25/2021 1210   BUN 32 (H) 12/28/2012 0748   CREATININE 1.00 12/25/2021 1210   CREATININE 1.29 12/28/2012 0748   CALCIUM 10.3 12/25/2021 1210   CALCIUM 14.3 (HH) 06/26/2019 1618   PROT 6.2 (L) 10/04/2021 1735   ALBUMIN 3.7 10/04/2021 1735   AST 18 10/04/2021 1735   ALT 13 10/04/2021 1735   ALKPHOS 31 (L) 10/04/2021 1735   BILITOT 0.7 10/04/2021 1735    GFRNONAA 59 (L) 12/25/2021 1210   GFRNONAA 43 (L) 12/28/2012 0748   GFRAA 58 (L) 06/28/2019 0427   GFRAA 50 (L) 12/28/2012 0748   Lab Results  Component Value Date   CHOL 174 08/07/2021   HDL 35.00 (L) 08/07/2021   LDLCALC 99 08/07/2021   TRIG 200.0 (H) 08/07/2021   CHOLHDL 5 08/07/2021   Lab Results  Component Value Date   HGBA1C 5.9 08/07/2021   No results found for: "VITAMINB12" Lab Results  Component Value Date   TSH 2.949 12/25/2021    Head CT 10/04/21 There are small foci of subarachnoid hemorrhage in the left parietal lobe. There is no epidural or subdural hematoma. Atrophy. There is subcutaneous hematoma in the right parietal scalp    ASSESSMENT AND PLAN  76 y.o. year old female with COPD, CHF, atrial fibrillation on anticoagulation who is presenting after a fall and postconcussive syndrome.  Her main complaint currently is the headaches.  She is having more than 15 episodes per month.  On exam also she was noted to  be very emotionally labile.  I will start her on amitriptyline as headache prevention and also advised the patient to try Tylenol or Aleve for headaches since she is not taking anything at the moment for abortive medication.  Discussed with patient that she should not take the OTC medications more than 3 days/week and to contact us if the headaches are getting worse.  She voices understanding, also encouraged her to start with physical activity, to start with PT at her assisted living facility.  She voices understanding.  Continue your other medications, continue with oxygen supplement, and follow-up in 6 months or sooner if worse.   1. Concussion with unknown loss of consciousness status, subsequent encounter   2. Post concussive syndrome   3. Chronic tension-type headache, not intractable   4. Gait abnormality      Patient Instructions  Start amitriptyline 25 mg, half tablet nightly for 1 week then increase to full tablet Continue your other  medications Continue to follow with PCP Follow-up in 6 months or sooner if worse.  No orders of the defined types were placed in this encounter.   Meds ordered this encounter  Medications   amitriptyline (ELAVIL) 25 MG tablet    Sig: Take 1 tablet (25 mg total) by mouth at bedtime.    Dispense:  30 tablet    Refill:  3    Return in about 6 months (around 09/11/2022).   Windell Norfolk, MD 03/13/2022, 12:02 PM  [facility] [address], [address] [place], [place] [address] [phone]

## 2022-03-13 NOTE — Patient Instructions (Signed)
Start amitriptyline 25 mg, half tablet nightly for 1 week then increase to full tablet Continue your other medications Continue to follow with PCP Follow-up in 6 months or sooner if worse.

## 2022-03-16 NOTE — Telephone Encounter (Signed)
She can get the ENT referral thru primary care. We would not recommend driving as she is still symptomatic. If she insist to drive, she can always get a DMV driving evaluation

## 2022-03-16 NOTE — Telephone Encounter (Signed)
I reviewed the chart, I did not see any discussion about driving for this pt.   Will have MD review.

## 2022-03-17 ENCOUNTER — Other Ambulatory Visit: Payer: Self-pay

## 2022-03-17 ENCOUNTER — Encounter (HOSPITAL_COMMUNITY): Payer: Self-pay | Admitting: Emergency Medicine

## 2022-03-17 ENCOUNTER — Emergency Department (HOSPITAL_COMMUNITY): Payer: Medicare Other

## 2022-03-17 ENCOUNTER — Inpatient Hospital Stay (HOSPITAL_COMMUNITY)
Admission: EM | Admit: 2022-03-17 | Discharge: 2022-03-22 | DRG: 871 | Disposition: A | Discharge status: Home Health | Payer: Medicare Other | Source: Skilled Nursing Facility | Attending: Student | Admitting: Student

## 2022-03-17 DIAGNOSIS — I25110 Atherosclerotic heart disease of native coronary artery with unstable angina pectoris: Secondary | ICD-10-CM | POA: Diagnosis present

## 2022-03-17 DIAGNOSIS — R509 Fever, unspecified: Secondary | ICD-10-CM | POA: Diagnosis not present

## 2022-03-17 DIAGNOSIS — I214 Non-ST elevation (NSTEMI) myocardial infarction: Secondary | ICD-10-CM | POA: Diagnosis present

## 2022-03-17 DIAGNOSIS — G4733 Obstructive sleep apnea (adult) (pediatric): Secondary | ICD-10-CM | POA: Diagnosis present

## 2022-03-17 DIAGNOSIS — A419 Sepsis, unspecified organism: Principal | ICD-10-CM | POA: Diagnosis present

## 2022-03-17 DIAGNOSIS — G4730 Sleep apnea, unspecified: Secondary | ICD-10-CM | POA: Diagnosis present

## 2022-03-17 DIAGNOSIS — T50B95A Adverse effect of other viral vaccines, initial encounter: Secondary | ICD-10-CM | POA: Diagnosis present

## 2022-03-17 DIAGNOSIS — E876 Hypokalemia: Secondary | ICD-10-CM | POA: Diagnosis present

## 2022-03-17 DIAGNOSIS — E1122 Type 2 diabetes mellitus with diabetic chronic kidney disease: Secondary | ICD-10-CM | POA: Diagnosis present

## 2022-03-17 DIAGNOSIS — Z881 Allergy status to other antibiotic agents status: Secondary | ICD-10-CM

## 2022-03-17 DIAGNOSIS — J441 Chronic obstructive pulmonary disease with (acute) exacerbation: Secondary | ICD-10-CM | POA: Diagnosis present

## 2022-03-17 DIAGNOSIS — J449 Chronic obstructive pulmonary disease, unspecified: Secondary | ICD-10-CM | POA: Diagnosis present

## 2022-03-17 DIAGNOSIS — Z950 Presence of cardiac pacemaker: Secondary | ICD-10-CM

## 2022-03-17 DIAGNOSIS — Z7901 Long term (current) use of anticoagulants: Secondary | ICD-10-CM

## 2022-03-17 DIAGNOSIS — I4891 Unspecified atrial fibrillation: Secondary | ICD-10-CM | POA: Diagnosis present

## 2022-03-17 DIAGNOSIS — J9611 Chronic respiratory failure with hypoxia: Secondary | ICD-10-CM | POA: Diagnosis present

## 2022-03-17 DIAGNOSIS — I959 Hypotension, unspecified: Secondary | ICD-10-CM | POA: Diagnosis present

## 2022-03-17 DIAGNOSIS — J440 Chronic obstructive pulmonary disease with acute lower respiratory infection: Secondary | ICD-10-CM | POA: Diagnosis present

## 2022-03-17 DIAGNOSIS — E1140 Type 2 diabetes mellitus with diabetic neuropathy, unspecified: Secondary | ICD-10-CM | POA: Diagnosis present

## 2022-03-17 DIAGNOSIS — I10 Essential (primary) hypertension: Secondary | ICD-10-CM | POA: Diagnosis present

## 2022-03-17 DIAGNOSIS — I5181 Takotsubo syndrome: Secondary | ICD-10-CM | POA: Diagnosis present

## 2022-03-17 DIAGNOSIS — I5043 Acute on chronic combined systolic (congestive) and diastolic (congestive) heart failure: Secondary | ICD-10-CM | POA: Diagnosis present

## 2022-03-17 DIAGNOSIS — I447 Left bundle-branch block, unspecified: Secondary | ICD-10-CM | POA: Diagnosis present

## 2022-03-17 DIAGNOSIS — Z888 Allergy status to other drugs, medicaments and biological substances status: Secondary | ICD-10-CM

## 2022-03-17 DIAGNOSIS — I272 Pulmonary hypertension, unspecified: Secondary | ICD-10-CM | POA: Diagnosis present

## 2022-03-17 DIAGNOSIS — Z825 Family history of asthma and other chronic lower respiratory diseases: Secondary | ICD-10-CM

## 2022-03-17 DIAGNOSIS — I442 Atrioventricular block, complete: Secondary | ICD-10-CM | POA: Diagnosis present

## 2022-03-17 DIAGNOSIS — Z79899 Other long term (current) drug therapy: Secondary | ICD-10-CM

## 2022-03-17 DIAGNOSIS — R7989 Other specified abnormal findings of blood chemistry: Secondary | ICD-10-CM | POA: Diagnosis not present

## 2022-03-17 DIAGNOSIS — I480 Paroxysmal atrial fibrillation: Secondary | ICD-10-CM | POA: Diagnosis not present

## 2022-03-17 DIAGNOSIS — I130 Hypertensive heart and chronic kidney disease with heart failure and stage 1 through stage 4 chronic kidney disease, or unspecified chronic kidney disease: Secondary | ICD-10-CM | POA: Diagnosis present

## 2022-03-17 DIAGNOSIS — I5032 Chronic diastolic (congestive) heart failure: Secondary | ICD-10-CM | POA: Diagnosis not present

## 2022-03-17 DIAGNOSIS — Z6837 Body mass index (BMI) 37.0-37.9, adult: Secondary | ICD-10-CM

## 2022-03-17 DIAGNOSIS — J439 Emphysema, unspecified: Secondary | ICD-10-CM | POA: Diagnosis present

## 2022-03-17 DIAGNOSIS — N1831 Chronic kidney disease, stage 3a: Secondary | ICD-10-CM | POA: Diagnosis present

## 2022-03-17 DIAGNOSIS — I4811 Longstanding persistent atrial fibrillation: Secondary | ICD-10-CM | POA: Diagnosis present

## 2022-03-17 DIAGNOSIS — J189 Pneumonia, unspecified organism: Secondary | ICD-10-CM | POA: Diagnosis present

## 2022-03-17 DIAGNOSIS — Y929 Unspecified place or not applicable: Secondary | ICD-10-CM

## 2022-03-17 DIAGNOSIS — F0781 Postconcussional syndrome: Secondary | ICD-10-CM | POA: Diagnosis present

## 2022-03-17 DIAGNOSIS — R6520 Severe sepsis without septic shock: Secondary | ICD-10-CM | POA: Diagnosis present

## 2022-03-17 DIAGNOSIS — Z1152 Encounter for screening for COVID-19: Secondary | ICD-10-CM

## 2022-03-17 DIAGNOSIS — I5030 Unspecified diastolic (congestive) heart failure: Secondary | ICD-10-CM | POA: Diagnosis present

## 2022-03-17 DIAGNOSIS — Z87440 Personal history of urinary (tract) infections: Secondary | ICD-10-CM

## 2022-03-17 DIAGNOSIS — Z87891 Personal history of nicotine dependence: Secondary | ICD-10-CM

## 2022-03-17 DIAGNOSIS — Z90710 Acquired absence of both cervix and uterus: Secondary | ICD-10-CM

## 2022-03-17 DIAGNOSIS — I428 Other cardiomyopathies: Secondary | ICD-10-CM | POA: Diagnosis present

## 2022-03-17 DIAGNOSIS — Z800 Family history of malignant neoplasm of digestive organs: Secondary | ICD-10-CM

## 2022-03-17 DIAGNOSIS — Z9981 Dependence on supplemental oxygen: Secondary | ICD-10-CM

## 2022-03-17 DIAGNOSIS — E669 Obesity, unspecified: Secondary | ICD-10-CM

## 2022-03-17 DIAGNOSIS — I454 Nonspecific intraventricular block: Secondary | ICD-10-CM | POA: Diagnosis present

## 2022-03-17 DIAGNOSIS — J410 Simple chronic bronchitis: Secondary | ICD-10-CM | POA: Diagnosis not present

## 2022-03-17 DIAGNOSIS — J4489 Other specified chronic obstructive pulmonary disease: Secondary | ICD-10-CM | POA: Diagnosis present

## 2022-03-17 DIAGNOSIS — I5021 Acute systolic (congestive) heart failure: Secondary | ICD-10-CM

## 2022-03-17 DIAGNOSIS — Z87820 Personal history of traumatic brain injury: Secondary | ICD-10-CM

## 2022-03-17 DIAGNOSIS — Z803 Family history of malignant neoplasm of breast: Secondary | ICD-10-CM

## 2022-03-17 DIAGNOSIS — Z9049 Acquired absence of other specified parts of digestive tract: Secondary | ICD-10-CM

## 2022-03-17 DIAGNOSIS — H8110 Benign paroxysmal vertigo, unspecified ear: Secondary | ICD-10-CM | POA: Diagnosis present

## 2022-03-17 LAB — URINALYSIS, ROUTINE W REFLEX MICROSCOPIC
Bacteria, UA: NONE SEEN
Bilirubin Urine: NEGATIVE
Glucose, UA: NEGATIVE mg/dL
Hgb urine dipstick: NEGATIVE
Ketones, ur: NEGATIVE mg/dL
Nitrite: NEGATIVE
Protein, ur: NEGATIVE mg/dL
Specific Gravity, Urine: 1.012 (ref 1.005–1.030)
WBC, UA: >50 WBC/hpf — ABNORMAL HIGH (ref 0–5)
pH: 7 (ref 5.0–8.0)

## 2022-03-17 LAB — COMPREHENSIVE METABOLIC PANEL
ALT: 11 U/L (ref 0–44)
AST: 15 U/L (ref 15–41)
Albumin: 3.4 g/dL — ABNORMAL LOW (ref 3.5–5.0)
Alkaline Phosphatase: 31 U/L — ABNORMAL LOW (ref 38–126)
Anion gap: 11 (ref 5–15)
BUN: 8 mg/dL (ref 8–23)
CO2: 21 mmol/L — ABNORMAL LOW (ref 22–32)
Calcium: 9 mg/dL (ref 8.9–10.3)
Chloride: 102 mmol/L (ref 98–111)
Creatinine, Ser: 0.83 mg/dL (ref 0.44–1.00)
GFR, Estimated: >60 mL/min (ref >60)
Glucose, Bld: 147 mg/dL — ABNORMAL HIGH (ref 70–99)
Potassium: 4.1 mmol/L (ref 3.5–5.1)
Sodium: 134 mmol/L — ABNORMAL LOW (ref 135–145)
Total Bilirubin: 0.8 mg/dL (ref 0.3–1.2)
Total Protein: 6.3 g/dL — ABNORMAL LOW (ref 6.5–8.1)

## 2022-03-17 LAB — CBC WITH DIFFERENTIAL/PLATELET
Abs Immature Granulocytes: 0.06 10*3/uL (ref 0.00–0.07)
Basophils Absolute: 0 10*3/uL (ref 0.0–0.1)
Basophils Relative: 0 %
Eosinophils Absolute: 0 10*3/uL (ref 0.0–0.5)
Eosinophils Relative: 0 %
HCT: 36.8 % (ref 36.0–46.0)
Hemoglobin: 11.8 g/dL — ABNORMAL LOW (ref 12.0–15.0)
Immature Granulocytes: 1 %
Lymphocytes Relative: 3 %
Lymphs Abs: 0.3 10*3/uL — ABNORMAL LOW (ref 0.7–4.0)
MCH: 27.6 pg (ref 26.0–34.0)
MCHC: 32.1 g/dL (ref 30.0–36.0)
MCV: 86.2 fL (ref 80.0–100.0)
Monocytes Absolute: 0.4 10*3/uL (ref 0.1–1.0)
Monocytes Relative: 4 %
Neutro Abs: 9 10*3/uL — ABNORMAL HIGH (ref 1.7–7.7)
Neutrophils Relative %: 92 %
Platelets: 195 10*3/uL (ref 150–400)
RBC: 4.27 MIL/uL (ref 3.87–5.11)
RDW: 14.1 % (ref 11.5–15.5)
WBC: 9.8 10*3/uL (ref 4.0–10.5)
nRBC: 0 % (ref 0.0–0.2)

## 2022-03-17 LAB — RESP PANEL BY RT-PCR (FLU A&B, COVID) ARPGX2
Influenza A by PCR: NEGATIVE
Influenza B by PCR: NEGATIVE
SARS Coronavirus 2 by RT PCR: NEGATIVE

## 2022-03-17 LAB — TROPONIN I (HIGH SENSITIVITY): Troponin I (High Sensitivity): 10 ng/L (ref <18)

## 2022-03-17 LAB — LACTIC ACID, PLASMA: Lactic Acid, Venous: 0.9 mmol/L (ref 0.5–1.9)

## 2022-03-17 LAB — BRAIN NATRIURETIC PEPTIDE: B Natriuretic Peptide: 195.9 pg/mL — ABNORMAL HIGH (ref 0.0–100.0)

## 2022-03-17 MED ORDER — VANCOMYCIN HCL 1250 MG/250ML IV SOLN: 1250.0000 mg | INTRAVENOUS | Status: DC

## 2022-03-17 MED ORDER — MAGNESIUM OXIDE -MG SUPPLEMENT 400 (240 MG) MG PO TABS
400.0000 mg | ORAL_TABLET | Freq: Every day | ORAL | Status: DC
  Administered 2022-03-18 – 2022-03-21 (×5): 400 mg via ORAL
  Filled 2022-03-17 (×5): qty 1

## 2022-03-17 MED ORDER — AMITRIPTYLINE HCL 50 MG PO TABS
25.0000 mg | ORAL_TABLET | Freq: Every day | ORAL | Status: DC
  Administered 2022-03-18: 25 mg via ORAL
  Filled 2022-03-17 (×5): qty 1

## 2022-03-17 MED ORDER — AZELASTINE HCL 0.1 % NA SOLN
2.0000 | Freq: Two times a day (BID) | NASAL | Status: DC
  Administered 2022-03-18 – 2022-03-22 (×8): 2 via NASAL
  Filled 2022-03-17 (×2): qty 30

## 2022-03-17 MED ORDER — REVEFENACIN 175 MCG/3ML IN SOLN
175.0000 ug | Freq: Every day | RESPIRATORY_TRACT | Status: DC
  Administered 2022-03-19 – 2022-03-20 (×2): 175 ug via RESPIRATORY_TRACT
  Filled 2022-03-17 (×3): qty 3

## 2022-03-17 MED ORDER — RIVAROXABAN 20 MG PO TABS
20.0000 mg | ORAL_TABLET | Freq: Every day | ORAL | Status: DC
  Administered 2022-03-18: 20 mg via ORAL
  Filled 2022-03-17: qty 2

## 2022-03-17 MED ORDER — LOSARTAN POTASSIUM 50 MG PO TABS: 25.0000 mg | ORAL_TABLET | Freq: Every day | ORAL | Status: DC

## 2022-03-17 MED ORDER — ACETAMINOPHEN 500 MG PO TABS
1000.0000 mg | ORAL_TABLET | Freq: Once | ORAL | Status: AC
  Administered 2022-03-17: 1000 mg via ORAL
  Filled 2022-03-17: qty 2

## 2022-03-17 MED ORDER — SPIRONOLACTONE 25 MG PO TABS: 25.0000 mg | ORAL_TABLET | Freq: Every day | ORAL | Status: DC

## 2022-03-17 MED ORDER — GUAIFENESIN-DM 100-10 MG/5ML PO SYRP
5.0000 mL | ORAL_SOLUTION | ORAL | Status: DC | PRN
  Administered 2022-03-20 – 2022-03-22 (×4): 5 mL via ORAL
  Filled 2022-03-17 (×5): qty 5

## 2022-03-17 MED ORDER — VANCOMYCIN HCL 2000 MG/400ML IV SOLN
2000.0000 mg | Freq: Once | INTRAVENOUS | Status: AC
  Administered 2022-03-17: 2000 mg via INTRAVENOUS
  Filled 2022-03-17: qty 400

## 2022-03-17 MED ORDER — ACETAMINOPHEN 325 MG PO TABS
650.0000 mg | ORAL_TABLET | Freq: Four times a day (QID) | ORAL | Status: DC | PRN
  Administered 2022-03-18: 650 mg via ORAL
  Filled 2022-03-17: qty 2

## 2022-03-17 MED ORDER — ALBUTEROL SULFATE (2.5 MG/3ML) 0.083% IN NEBU
2.5000 mg | INHALATION_SOLUTION | RESPIRATORY_TRACT | Status: DC | PRN
  Administered 2022-03-18 – 2022-03-20 (×4): 2.5 mg via RESPIRATORY_TRACT
  Filled 2022-03-17 (×4): qty 3

## 2022-03-17 MED ORDER — ACETAMINOPHEN 650 MG RE SUPP: 650.0000 mg | Freq: Four times a day (QID) | RECTAL | Status: DC | PRN

## 2022-03-17 MED ORDER — SODIUM CHLORIDE 0.9 % IV SOLN
2.0000 g | Freq: Once | INTRAVENOUS | Status: AC
  Administered 2022-03-17: 2 g via INTRAVENOUS
  Filled 2022-03-17: qty 12.5

## 2022-03-17 MED ORDER — VITAMIN D 25 MCG (1000 UNIT) PO TABS
2000.0000 [IU] | ORAL_TABLET | Freq: Every day | ORAL | Status: DC
  Administered 2022-03-18 – 2022-03-21 (×5): 2000 [IU] via ORAL
  Filled 2022-03-17 (×5): qty 2

## 2022-03-17 MED ORDER — FEBUXOSTAT 40 MG PO TABS
40.0000 mg | ORAL_TABLET | Freq: Every day | ORAL | Status: DC
  Administered 2022-03-19: 40 mg via ORAL
  Filled 2022-03-17 (×2): qty 1

## 2022-03-17 MED ORDER — BEMPEDOIC ACID 180 MG PO TABS: 1.0000 | ORAL_TABLET | Freq: Every day | ORAL | Status: DC

## 2022-03-17 MED ORDER — ALBUTEROL SULFATE (2.5 MG/3ML) 0.083% IN NEBU
INHALATION_SOLUTION | RESPIRATORY_TRACT | Status: AC
  Administered 2022-03-18: 2.5 mg via RESPIRATORY_TRACT
  Filled 2022-03-17: qty 3

## 2022-03-17 MED ORDER — SODIUM CHLORIDE 0.9 % IV SOLN: 2.0000 g | Freq: Two times a day (BID) | INTRAVENOUS | Status: DC

## 2022-03-17 MED ORDER — DOFETILIDE 250 MCG PO CAPS
250.0000 ug | ORAL_CAPSULE | Freq: Two times a day (BID) | ORAL | Status: DC
  Administered 2022-03-18 – 2022-03-22 (×9): 250 ug via ORAL
  Filled 2022-03-17 (×11): qty 1

## 2022-03-17 MED ORDER — SODIUM CHLORIDE 0.9 % IV SOLN
2.0000 g | INTRAVENOUS | Status: DC
  Administered 2022-03-18: 2 g via INTRAVENOUS
  Filled 2022-03-17: qty 20

## 2022-03-17 NOTE — Progress Notes (Signed)
Pharmacy Antibiotic Note  Kathleen [name] is a 76 y.o. female admitted on 03/17/2022 with sepsis. Pharmacy has been consulted for cefepime and vancomycin dosing.  Plan: Initiate cefepime 2g Q12h Initiate vancomycin 2000mg  LD then 1250 Q36 (eAUC 463.7, Scr 0.83, Vd 0.5) Monitor renal function, s/s of infection, follow cultures for pathogen directed therapy   Height: 5\' 1"  (814.4 cm) Weight: 87.5 kg (193 lb) IBW/kg (Calculated) : 47.8  Temp (24hrs), Avg:103.1 F (39.5 C), Min:103.1 F (39.5 C), Max:103.1 F (39.5 C)  Recent Labs  Lab 03/17/22 1847  WBC 9.8  CREATININE 0.83  LATICACIDVEN 0.9    Estimated Creatinine Clearance: 58.9 mL/min (by C-G formula based on SCr of 0.83 mg/dL).    Allergies  Allergen Reactions   Allopurinol Other (See Comments)    Reaction:  Dizziness    Clindamycin Anaphylaxis and Hives   Pneumococcal 13-Val Conj Vacc Itching, Swelling and Rash   Dronedarone Rash   Brovana [Arformoterol]     Caused muscle pain   Budesonide     Caused extreme joint pain   Entresto [Sacubitril-Valsartan] Other (See Comments)    hypotension   Fosamax [Alendronate Sodium] Nausea Only   Jardiance [Empagliflozin]     Caused a vaginal infection   Meperidine Nausea And Vomiting   Microplegia Msa-Msg [Plegisol]     Loopy,diarrhea   Rosuvastatin Other (See Comments)    Reaction:  Muscle spasms    Tetracycline Hives   Adhesive [Tape] Rash   Lovastatin Rash    Other reaction(s): Muscle Pain    Antimicrobials this admission: Cefepime 10/10 >>  Vancomycin 10/10 >>   Dose adjustments this admission: N/a  Microbiology results: 10/10 BCx: collected 10/10 resp panel: negative  Thank you for allowing pharmacy to be a part of this patient's care.  Titus Dubin, PharmD PGY1 Pharmacy Resident 03/17/2022 9:18 PM

## 2022-03-17 NOTE — ED Triage Notes (Signed)
Pt BIB [facility] to [facility] ED. EMS reported patient had weakness on Thursday after taking new medication. Yesterday patient had flu shot , started feeling worse. At 1700 patient was talking to son, she could not find her words, she was stuttering. EMS reported patient had no neuro deficit. Patient use oxygen 4 liters at home. She is from  [facility].

## 2022-03-17 NOTE — ED Provider Notes (Signed)
[facility] EMERGENCY DEPARTMENT Provider Note   CSN: [id number] Arrival date & time: 03/17/22  1815     History  Chief Complaint  Patient presents with   Code Sepsis    Kathleen [name] is a 76 y.o. female.  76 year old female with prior medical history as detailed below presents for evaluation.  Patient reports that she began feeling fatigued and tired over the weekend.  Yesterday the patient reports that she had subjective fevers and felt mildly confused.  Patient resides at [facility] in independent living.  She does use 4 L nasal cannula O2 at all times.  She reports increased cough over the last 72 hours.  Cough is dry and nonproductive.  She denies significant worsening of her shortness of breath.  She does report mildly worsened shortness of breath with exertion.  She has not adjusted her FiO2.   Her fever was worse this afternoon.  She called EMS for transport and evaluation.  She has not taken anything for her fever.  She denies known sick contacts.  She denies abdominal pain.  She denies chest pain.  She denies urinary symptoms.  The history is provided by the patient and medical records.       Home Medications Prior to Admission medications   Medication Sig Start Date End Date Taking? Authorizing Provider  acetaminophen (TYLENOL) 650 MG CR tablet Take 1,300 mg by mouth as needed for pain (take with budesonide).    [provider]  amitriptyline (ELAVIL) 25 MG tablet Take 1 tablet (25 mg total) by mouth at bedtime. 03/12/22   Windell Norfolk, MD  azelastine (ASTELIN) 0.1 % nasal spray Place 2 sprays into both nostrils 2 (two) times daily. 02/17/22   Oretha Milch, MD  Bempedoic Acid (NEXLETOL) 180 MG TABS Take 1 tablet by mouth daily. 08/08/21   [name], MD  Cholecalciferol (VITAMIN D3) 50 MCG (2000 UT) TABS Take 2,000 Units by mouth at bedtime.    [provider]  cyanocobalamin (,VITAMIN B-12,) 1000 MCG/ML injection Inject 1 mL  (1,000 mcg total) into the muscle every 30 (thirty) days. 11/19/21   [name], MD  docusate sodium (COLACE) 100 MG capsule Take 100 mg by mouth 2 (two) times daily.    [provider]  dofetilide (TIKOSYN) 250 MCG capsule Take 1 capsule (250 mcg total) by mouth in the morning and at bedtime. 10/02/21 10/02/22  Newman Nip, NP  febuxostat (ULORIC) 40 MG tablet TAKE 1 TABLET(40 MG) BY MOUTH EVERY NIGHT 03/05/22   [name], MD  losartan (COZAAR) 25 MG tablet TAKE 1 TABLET(25 MG) BY MOUTH AT BEDTIME 10/01/21   Bensimhon, [name], MD  Magnesium 500 MG TABS Take 500 mg by mouth at bedtime.    [provider]  mupirocin ointment (BACTROBAN) 2 % Apply 1 application. topically 2 (two) times daily. 09/09/21   [name], MD  nystatin cream (MYCOSTATIN) Apply to affected area 2 times daily 09/08/21   [name], MD  OXYGEN Inhale 4 L into the lungs continuous. Use with resmed ventilator    [provider]  rivaroxaban (XARELTO) 20 MG TABS tablet Take 1 tablet (20 mg total) by mouth daily with supper. 12/29/21   Bensimhon, [name], MD  spironolactone (ALDACTONE) 25 MG tablet TAKE 1 TABLET(25 MG) BY MOUTH EVERY MORNING 10/28/21   Bensimhon, [name], MD  torsemide [facility]) 20 MG tablet Take 20mg  (1 Tab) Monday and Friday 06/24/21  Bensimhon, Shaune Pascal, MD  YUPELRI 175 MCG/3ML nebulizer solution USE 1 VIAL IN NEBULIZER DAILY - DO NOT MIX WITH OTHER NEB MEDS,USE BEFORE OR AFTER 04/02/21   Freddi Starr, MD      Allergies    Allopurinol, Clindamycin, Pneumococcal 13-val conj vacc, Dronedarone, Brovana [arformoterol], Budesonide, Entresto [sacubitril-valsartan], Fosamax [alendronate sodium], Jardiance [empagliflozin], Meperidine, Microplegia msa-msg [plegisol], Rosuvastatin, Tetracycline, Adhesive [tape], and Lovastatin    Review of Systems   Review of Systems  All other systems reviewed and are negative.   Physical Exam Updated Vital Signs BP (!) 151/40    Pulse 84   Temp (!) 103.1 F (39.5 C) (Oral)   Resp (!) 33   Ht 5\' 1"  (1.549 m)   Wt 87.5 kg   SpO2 99%   BMI 36.47 kg/m  Physical Exam Vitals and nursing note reviewed.  Constitutional:      General: She is not in acute distress.    Appearance: Normal appearance. She is well-developed.  HENT:     Head: Normocephalic and atraumatic.     Mouth/Throat:     Mouth: Mucous membranes are dry.  Eyes:     Conjunctiva/sclera: Conjunctivae normal.     Pupils: Pupils are equal, round, and reactive to light.  Cardiovascular:     Rate and Rhythm: Regular rhythm. Tachycardia present.     Heart sounds: Normal heart sounds.  Pulmonary:     Effort: Pulmonary effort is normal. No respiratory distress.     Comments: Decreased breath sounds at bilateral bases Abdominal:     General: There is no distension.     Palpations: Abdomen is soft.     Tenderness: There is no abdominal tenderness.  Musculoskeletal:        General: No deformity. Normal range of motion.     Cervical back: Normal range of motion and neck supple.  Skin:    General: Skin is warm and dry.  Neurological:     General: No focal deficit present.     Mental Status: She is alert and oriented to person, place, and time.    ED Results / Procedures / Treatments   Labs (all labs ordered are listed, but only abnormal results are displayed) Labs Reviewed  CULTURE, BLOOD (ROUTINE X 2)  CULTURE, BLOOD (ROUTINE X 2)  RESP PANEL BY RT-PCR (FLU A&B, COVID) ARPGX2  CBC WITH DIFFERENTIAL/PLATELET  COMPREHENSIVE METABOLIC PANEL  LACTIC ACID, PLASMA  LACTIC ACID, PLASMA  URINALYSIS, ROUTINE W REFLEX MICROSCOPIC  BRAIN NATRIURETIC PEPTIDE  TROPONIN I (HIGH SENSITIVITY)    EKG EKG Interpretation  Date/Time:  Tuesday March 17 2022 18:24:12 EDT Ventricular Rate:  89 PR Interval:  222 QRS Duration: 134 QT Interval:  358 QTC Calculation: 436 R Axis:   -74 Text Interpretation: Sinus rhythm Prolonged PR interval Nonspecific IVCD  with LAD Probable inferior infarct, recent Extensive anterior infarct, old Lateral leads are also involved Confirmed by Dene Gentry [phone]) on 03/17/2022 6:33:38 PM  Radiology DG Chest Port 1 View  Result Date: 03/17/2022 CLINICAL DATA:  Fever. Weakness. EXAM: PORTABLE CHEST 1 VIEW COMPARISON:  11/19/2021, CT 09/03/2021 FINDINGS: Multi lead left-sided pacemaker in place. Stable cardiomegaly. Unchanged mediastinal contours with aortic atherosclerosis. Chronic hyperinflation. Increased bronchial thickening from prior. Left lung base assessment is suboptimal due to overlapping soft tissue structures. No large pleural effusion. No pneumothorax. Chronic right shoulder arthropathy. IMPRESSION: 1. Increased bronchial thickening from prior, may be infectious or inflammatory. 2. Chronic hyperinflation.  Unchanged cardiomegaly. Electronically Signed   By:  Narda Rutherford M.D.   On: 03/17/2022 19:08    Procedures Procedures    Medications Ordered in ED Medications  vancomycin (VANCOREADY) IVPB 2000 mg/400 mL (has no administration in time range)  ceFEPIme (MAXIPIME) 2 g in sodium chloride 0.9 % 100 mL IVPB (has no administration in time range)  acetaminophen (TYLENOL) tablet 1,000 mg (1,000 mg Oral Given 03/17/22 1849)    ED Course/ Medical Decision Making/ A&P                           Medical Decision Making Amount and/or Complexity of Data Reviewed Labs: ordered. Radiology: ordered.  Risk OTC drugs. Prescription drug management. Decision regarding hospitalization.    Medical Screen Complete  This patient presented to the ED with complaint of fever, malaise, fatigue.  This complaint involves an extensive number of treatment options. The initial differential diagnosis includes, but is not limited to, dural versus viral infection, metabolic abnormality, etc.  This presentation is: Acute, Chronic, Self-Limited, Previously Undiagnosed, Uncertain Prognosis, Complicated, Systemic  Symptoms, and Threat to Life/Bodily Function  Patient with multiple comorbidities is presenting with complaint of malaise, fatigue, cough, mild shortness of breath, and fever.  Presentation is concerning given patient's initial temperature of 103.1.  Broad-spectrum antibiotics administered on arrival and initial labs obtained.  COVID and flu are negative.  Chest x-ray is without clear restriction of infiltrate.  UA is pending at time of admission.  Patient would benefit from admission.  Hospitalist service is aware of case and will evaluate for same.  Additional history obtained:  External records from outside sources obtained and reviewed including prior ED visits and prior Inpatient records.    Lab Tests:  I ordered and personally interpreted labs.  The pertinent results include: BC, CMP, lactic acid, COVID, flu, BMP, troponin, UA   Imaging Studies ordered:  I ordered imaging studies including ET head, chest x-ray I independently visualized and interpreted obtained imaging which showed NAD I agree with the radiologist interpretation.   Cardiac Monitoring:  The patient was maintained on a cardiac monitor.  I personally viewed and interpreted the cardiac monitor which showed an underlying rhythm of: NSR   Medicines ordered:  I ordered medication including broad spectrum antibiotics for suspected infection Reevaluation of the patient after these medicines showed that the patient: improved  Problem List / ED Course:  Fever   Reevaluation:  After the interventions noted above, I reevaluated the patient and found that they have: improved   Disposition:  After consideration of the diagnostic results and the patients response to treatment, I feel that the patent would benefit from admission.          Final Clinical Impression(s) / ED Diagnoses Final diagnoses:  Fever, unspecified fever cause    Rx / DC Orders ED Discharge Orders     None          Wynetta Fines, MD 03/17/22 2150

## 2022-03-17 NOTE — H&P (Signed)
History and Physical    Kathleen [name] ZYS:[id number] DOB: 1945/08/02 DOA: 03/17/2022  PCP: [name], MD  Patient coming from: Independent living facility  I have personally briefly reviewed patient's old medical records available.   Chief Complaint: Fever, confusion for 1 day  HPI: Kathleen [name] is a 76 y.o. 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  Today she was very tired and fatigued.  She felt mildly confused yesterday.  Today she called her son and she sounded like she is having a stroke as she could not talk and sounded confused so EMS was activated and she was brought to the ER.  Patient tells me that she did fairly well until her flu shot yesterday.  She does have chronic cough and has been slightly worsening last few days.  No sputum production.  No sick contacts.  Not needed more than usual oxygen.  Her temperature was 103 at her apartment, once her temperature came down she felt pretty well. ED Course: Temperature 103, blood pressure stable, WC count 9.8.  On 4 L oxygen.  Chest x-ray with bilateral lower lobe opacity, mostly chronic findings.  CT head normal.  Blood cultures were drawn.  Patient was given 1 dose of vancomycin and cefepime COVID-19 influenza swab negative.  Requested admission due to severity of symptoms.  Review of Systems: all systems are reviewed and pertinent positive as per HPI otherwise rest are negative.    Past Medical History:  Diagnosis Date   Asthma    Atypical atrial flutter (HCC)    BOOP  (bronchiolitis obliterans with organizing pneumonia) (HCC)    CHF (congestive heart failure) (HCC)    Chronic renal insufficiency    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    Concussion 10/04/2021   COPD (chronic obstructive pulmonary disease) (HCC)    Gout    Hypertension    Longstanding persistent atrial fibrillation (HCC)    Nonischemic cardiomyopathy (HCC)    Obesity    Pacemaker    Spontaneous pneumothorax 2013    Past Surgical History:  Procedure Laterality Date   ABDOMINAL HYSTERECTOMY     APPENDECTOMY     ATRIAL FIBRILLATION ABLATION  07/20/2013   by Dr Christin Fudge   AV nodal ablation  11/01/2013   by Dr Christin Fudge, repeated by Dr Wilford Grist   BREAST BIOPSY Bilateral 1997   negative   CARDIAC CATHETERIZATION     CHOLECYSTECTOMY     HERNIA REPAIR     PACEMAKER INSERTION  06/2017   MDT Viva CRT-P implanted by Dr Christin Fudge after AV nodal ablation,  LV lead could not be placed   PACEMAKER INSERTION  05/2020   with lead bundle    Social history   reports that she quit smoking about 34 years ago. Her smoking use included cigarettes. She has a 20.00 pack-year smoking history. She has been exposed to tobacco smoke. She has never used smokeless tobacco. She reports that she does not drink alcohol and does not use drugs.  Allergies  Allergen Reactions   Allopurinol Other (See Comments)    Reaction:  Dizziness    Clindamycin Anaphylaxis and Hives  Pneumococcal 13-Val Conj Vacc Itching, Swelling and Rash   Dronedarone Rash   Brovana [Arformoterol]     Caused muscle pain   Budesonide     Caused extreme joint pain   Entresto [Sacubitril-Valsartan] Other (See Comments)    hypotension   Fosamax [Alendronate Sodium] Nausea Only   Jardiance [Empagliflozin]     Caused a vaginal infection   Meperidine Nausea And Vomiting   Microplegia Msa-Msg [Plegisol]     Loopy,diarrhea   Rosuvastatin Other (See Comments)    Reaction:  Muscle spasms    Tetracycline Hives   Adhesive  [Tape] Rash   Lovastatin Rash    Other reaction(s): Muscle Pain    Family History  Problem Relation Age of Onset   Breast cancer Mother 87       3 different times   Pancreatic cancer Mother    Emphysema Father    Healthy Brother    Breast cancer Cousin    Valvular heart disease Son    Obesity Daughter    Colon cancer Neg Hx    Esophageal cancer Neg Hx    Stomach cancer Neg Hx    Inflammatory bowel disease Neg Hx    Liver disease Neg Hx    Rectal cancer Neg Hx      Prior to Admission medications   Medication Sig Start Date End Date Taking? Authorizing Provider  acetaminophen (TYLENOL) 650 MG CR tablet Take 1,300 mg by mouth as needed for pain (take with budesonide).    [provider]  amitriptyline (ELAVIL) 25 MG tablet Take 1 tablet (25 mg total) by mouth at bedtime. 03/12/22   Windell Norfolk, MD  azelastine (ASTELIN) 0.1 % nasal spray Place 2 sprays into both nostrils 2 (two) times daily. 02/17/22   Oretha Milch, MD  Bempedoic Acid (NEXLETOL) 180 MG TABS Take 1 tablet by mouth daily. 08/08/21   [name], MD  Cholecalciferol (VITAMIN D3) 50 MCG (2000 UT) TABS Take 2,000 Units by mouth at bedtime.    [provider]  cyanocobalamin (,VITAMIN B-12,) 1000 MCG/ML injection Inject 1 mL (1,000 mcg total) into the muscle every 30 (thirty) days. 11/19/21   [name], MD  docusate sodium (COLACE) 100 MG capsule Take 100 mg by mouth 2 (two) times daily.    [provider]  dofetilide (TIKOSYN) 250 MCG capsule Take 1 capsule (250 mcg total) by mouth in the morning and at bedtime. 10/02/21 10/02/22  Newman Nip, NP  febuxostat (ULORIC) 40 MG tablet TAKE 1 TABLET(40 MG) BY MOUTH EVERY NIGHT 03/05/22   [name], MD  losartan (COZAAR) 25 MG tablet TAKE 1 TABLET(25 MG) BY MOUTH AT BEDTIME 10/01/21   Bensimhon, [name], MD  Magnesium 500 MG TABS Take 500 mg by mouth at bedtime.    [provider]  mupirocin ointment (BACTROBAN) 2 % Apply 1  application. topically 2 (two) times daily. 09/09/21   [name], MD  nystatin cream (MYCOSTATIN) Apply to affected area 2 times daily 09/08/21   [name], MD  OXYGEN Inhale 4 L into the lungs continuous. Use with resmed ventilator    [provider]  rivaroxaban (XARELTO) 20 MG TABS tablet Take 1 tablet (20 mg total) by mouth daily with supper. 12/29/21   Bensimhon, [name], MD  spironolactone (ALDACTONE) 25 MG tablet TAKE 1 TABLET(25 MG) BY MOUTH EVERY MORNING 10/28/21   Bensimhon, [name], MD  torsemide (DEMADEX) 20 MG tablet Take 20mg  (1  Tab) Monday and Friday 06/24/21   Bensimhon, [name], MD  YUPELRI 175 MCG/3ML nebulizer solution USE 1 VIAL IN NEBULIZER DAILY - DO NOT MIX WITH OTHER NEB MEDS,USE BEFORE OR AFTER 04/02/21   Martina Sinner, MD    Physical Exam: Vitals:   03/17/22 2015 03/17/22 2030 03/17/22 2120 03/17/22 2145  BP: (!) 102/42 (!) 115/50 101/76 103/64  Pulse: 77 80 76 76  Resp: (!) 23 (!) 23 20   Temp:   99.6 F (37.6 C)   TempSrc:   Oral   SpO2: 96% 97% 98% 100%  Weight:      Height:        Constitutional: NAD, calm, comfortable, able to talk in complete sentences. Vitals:   03/17/22 2015 03/17/22 2030 03/17/22 2120 03/17/22 2145  BP: (!) 102/42 (!) 115/50 101/76 103/64  Pulse: 77 80 76 76  Resp: (!) 23 (!) 23 20   Temp:   99.6 F (37.6 C)   TempSrc:   Oral   SpO2: 96% 97% 98% 100%  Weight:      Height:       Eyes: PERRL, lids and conjunctivae normal ENMT: Mucous membranes are moist. Posterior pharynx clear of any exudate or lesions.Normal dentition.  Neck: normal, supple, no masses, no thyromegaly Respiratory: clear to auscultation bilaterally, no wheezing, no crackles. Normal respiratory effort. No accessory muscle use.  No added sounds. Cardiovascular: Regular rate and rhythm, no murmurs / rubs / gallops. No extremity edema. 2+ pedal pulses. No carotid bruits.  Pacemaker left precordium. Abdomen: no tenderness, no masses palpated.  No hepatosplenomegaly. Bowel sounds positive.  Musculoskeletal: no clubbing / cyanosis. No joint deformity upper and lower extremities. Good ROM, no contractures. Normal muscle tone.  Skin: no rashes, lesions, ulcers. No induration Neurologic: CN 2-12 grossly intact. Sensation intact, DTR normal. Strength 5/5 in all 4.  Psychiatric: Normal judgment and insight. Alert and oriented x 3. Normal mood.     Labs on Admission: I have personally reviewed following labs and imaging studies  CBC: Recent Labs  Lab 03/17/22 1847  WBC 9.8  NEUTROABS 9.0*  HGB 11.8*  HCT 36.8  MCV 86.2  PLT 195   Basic Metabolic Panel: Recent Labs  Lab 03/17/22 1847  NA 134*  K 4.1  CL 102  CO2 21*  GLUCOSE 147*  BUN 8  CREATININE 0.83  CALCIUM 9.0   GFR: Estimated Creatinine Clearance: 58.9 mL/min (by C-G formula based on SCr of 0.83 mg/dL). Liver Function Tests: Recent Labs  Lab 03/17/22 1847  AST 15  ALT 11  ALKPHOS 31*  BILITOT 0.8  PROT 6.3*  ALBUMIN 3.4*   No results for input(s): "LIPASE", "AMYLASE" in the last 168 hours. No results for input(s): "AMMONIA" in the last 168 hours. Coagulation Profile: No results for input(s): "INR", "PROTIME" in the last 168 hours. Cardiac Enzymes: No results for input(s): "CKTOTAL", "CKMB", "CKMBINDEX", "TROPONINI" in the last 168 hours. BNP (last 3 results) No results for input(s): "PROBNP" in the last 8760 hours. HbA1C: No results for input(s): "HGBA1C" in the last 72 hours. CBG: No results for input(s): "GLUCAP" in the last 168 hours. Lipid Profile: No results for input(s): "CHOL", "HDL", "LDLCALC", "TRIG", "CHOLHDL", "LDLDIRECT" in the last 72 hours. Thyroid Function Tests: No results for input(s): "TSH", "T4TOTAL", "FREET4", "T3FREE", "THYROIDAB" in the last 72 hours. Anemia Panel: No results for input(s): "VITAMINB12", "FOLATE", "FERRITIN", "TIBC", "IRON", "RETICCTPCT" in the last 72 hours. Urine analysis:    Component Value Date/Time  COLORURINE YELLOW 08/07/2021 [place] 08/07/2021 [place] 12/25/2012 1653   LABSPEC 1.015 08/07/2021 1219   LABSPEC 1.021 12/25/2012 1653   PHURINE 6.5 08/07/2021 1219   GLUCOSEU NEGATIVE 08/07/2021 1219   HGBUR NEGATIVE 08/07/2021 1219   BILIRUBINUR NEGATIVE 08/07/2021 1219   BILIRUBINUR Negative 12/25/2012 1653   KETONESUR TRACE (A) 08/07/2021 1219   PROTEINUR NEGATIVE 05/10/2021 1720   UROBILINOGEN 1.0 08/07/2021 1219   NITRITE NEGATIVE 08/07/2021 1219   LEUKOCYTESUR SMALL (A) 08/07/2021 1219   LEUKOCYTESUR Negative 12/25/2012 1653    Radiological Exams on Admission: CT Head Wo Contrast  Result Date: 03/17/2022 CLINICAL DATA:  Mental status change, unknown cause. Fever and weakness. EXAM: CT HEAD WITHOUT CONTRAST TECHNIQUE: Contiguous axial images were obtained from the base of the skull through the vertex without intravenous contrast. RADIATION DOSE REDUCTION: This exam was performed according to the departmental dose-optimization program which includes automated exposure control, adjustment of the mA and/or kV according to patient size and/or use of iterative reconstruction technique. COMPARISON:  10/05/2021. FINDINGS: Brain: No acute intracranial hemorrhage, midline shift or mass effect. No extra-axial fluid collection. Diffuse atrophy is noted. Gray-white matter differentiation is within normal limits. No hydrocephalus. Vascular: No hyperdense vessel or unexpected calcification. Skull: No acute fracture. Sinuses/Orbits: There is partial opacification of the sphenoid sinus on the left and ethmoid air cells on the right. No acute orbital abnormality. Other: None. IMPRESSION: 1. No acute intracranial process. 2. Atrophy. Electronically Signed   By: Brett Fairy M.D.   On: 03/17/2022 20:59   DG Chest Port 1 View  Result Date: 03/17/2022 CLINICAL DATA:  Fever. Weakness. EXAM: PORTABLE CHEST 1 VIEW COMPARISON:  11/19/2021, CT 09/03/2021 FINDINGS: Multi  lead left-sided pacemaker in place. Stable cardiomegaly. Unchanged mediastinal contours with aortic atherosclerosis. Chronic hyperinflation. Increased bronchial thickening from prior. Left lung base assessment is suboptimal due to overlapping soft tissue structures. No large pleural effusion. No pneumothorax. Chronic right shoulder arthropathy. IMPRESSION: 1. Increased bronchial thickening from prior, may be infectious or inflammatory. 2. Chronic hyperinflation.  Unchanged cardiomegaly. Electronically Signed   By: Keith Rake M.D.   On: 03/17/2022 19:08    EKG: Independently reviewed.  No acute findings.  She has mostly chronic changes on her EKG including bundle branch block and prolonged PR interval.  Assessment/Plan Active Problems:   Sepsis ([place])     1.  Sepsis present on admission, sepsis due to altered mental status, fever 103 and respiratory 33 on presentation.  Likely source of infection pneumonia or respiratory virus. Agree with admission given severity of symptoms. Patient received vancomycin and cefepime in the ER, blood cultures were drawn.  She looks fairly stable and differential diagnosis includes viral infection or febrile reaction to influenza vaccine. Given her severe comorbidities, she needs to be monitored in the hospital. We will start patient on Rocephin, avoid azithromycin with use of Tikosyn. Chest physiotherapy, incentive spirometry, deep breathing exercises, sputum induction, mucolytic's and bronchodilators. Sputum cultures, blood cultures, Legionella and streptococcal antigen. Supplemental oxygen to keep saturations more than 90%.  Mobilize.  2.  Chronic a flutter status post pacemaker: Rate controlled. Uninterrupted Tikosyn therapy, resume tonight. Therapeutic on Xarelto.  3.  Chronic respiratory failure with hypoxemia, BOOP: Supportive treatment.  Does not use ventilator at home even though prescribed.  Bronchodilators.  Continue Claritin.  4.  Essential  hypertension: Blood pressure fairly stable.  Resume losartan.  DVT prophylaxis: Xarelto Code Status: Full code Family Communication: Son at the bedside  Disposition Plan: Back to independent living Consults called: None Admission status: Inpatient, telemetry.   Dorcas Carrow MD [facility] Pager [phone]

## 2022-03-18 ENCOUNTER — Inpatient Hospital Stay (HOSPITAL_COMMUNITY): Payer: Medicare Other

## 2022-03-18 DIAGNOSIS — E1140 Type 2 diabetes mellitus with diabetic neuropathy, unspecified: Secondary | ICD-10-CM | POA: Diagnosis not present

## 2022-03-18 DIAGNOSIS — A419 Sepsis, unspecified organism: Secondary | ICD-10-CM | POA: Diagnosis not present

## 2022-03-18 DIAGNOSIS — R509 Fever, unspecified: Secondary | ICD-10-CM

## 2022-03-18 DIAGNOSIS — I214 Non-ST elevation (NSTEMI) myocardial infarction: Secondary | ICD-10-CM

## 2022-03-18 DIAGNOSIS — Z7901 Long term (current) use of anticoagulants: Secondary | ICD-10-CM

## 2022-03-18 DIAGNOSIS — N1831 Chronic kidney disease, stage 3a: Secondary | ICD-10-CM

## 2022-03-18 DIAGNOSIS — R6520 Severe sepsis without septic shock: Secondary | ICD-10-CM

## 2022-03-18 DIAGNOSIS — G4730 Sleep apnea, unspecified: Secondary | ICD-10-CM | POA: Diagnosis not present

## 2022-03-18 DIAGNOSIS — F0781 Postconcussional syndrome: Secondary | ICD-10-CM

## 2022-03-18 DIAGNOSIS — I5032 Chronic diastolic (congestive) heart failure: Secondary | ICD-10-CM

## 2022-03-18 DIAGNOSIS — I480 Paroxysmal atrial fibrillation: Secondary | ICD-10-CM

## 2022-03-18 DIAGNOSIS — J410 Simple chronic bronchitis: Secondary | ICD-10-CM

## 2022-03-18 DIAGNOSIS — I959 Hypotension, unspecified: Secondary | ICD-10-CM

## 2022-03-18 DIAGNOSIS — I10 Essential (primary) hypertension: Secondary | ICD-10-CM

## 2022-03-18 DIAGNOSIS — E669 Obesity, unspecified: Secondary | ICD-10-CM

## 2022-03-18 DIAGNOSIS — R7989 Other specified abnormal findings of blood chemistry: Secondary | ICD-10-CM

## 2022-03-18 LAB — TROPONIN I (HIGH SENSITIVITY)
Troponin I (High Sensitivity): 2438 ng/L (ref <18)
Troponin I (High Sensitivity): 1869 ng/L (ref <18)
Troponin I (High Sensitivity): 1658 ng/L (ref <18)

## 2022-03-18 LAB — PROCALCITONIN: Procalcitonin: 7.36 ng/mL

## 2022-03-18 LAB — RESPIRATORY PANEL BY PCR

## 2022-03-18 LAB — CREATININE, SERUM
Creatinine, Ser: 0.85 mg/dL (ref 0.44–1.00)
GFR, Estimated: >60 mL/min (ref >60)

## 2022-03-18 LAB — MAGNESIUM: Magnesium: 1.9 mg/dL (ref 1.7–2.4)

## 2022-03-18 LAB — BRAIN NATRIURETIC PEPTIDE: B Natriuretic Peptide: 438.3 pg/mL — ABNORMAL HIGH (ref 0.0–100.0)

## 2022-03-18 LAB — STREP PNEUMONIAE URINARY ANTIGEN: Strep Pneumo Urinary Antigen: NEGATIVE

## 2022-03-18 LAB — CBG MONITORING, ED: Glucose-Capillary: 137 mg/dL — ABNORMAL HIGH (ref 70–99)

## 2022-03-18 MED ORDER — SODIUM CHLORIDE 0.9 % IV BOLUS
500.0000 mL | Freq: Once | INTRAVENOUS | Status: AC
  Administered 2022-03-18: 500 mL via INTRAVENOUS

## 2022-03-18 MED ORDER — VANCOMYCIN HCL 1250 MG/250ML IV SOLN
1250.0000 mg | INTRAVENOUS | Status: DC
  Administered 2022-03-19: 1250 mg via INTRAVENOUS
  Filled 2022-03-18: qty 250

## 2022-03-18 MED ORDER — IOHEXOL 350 MG/ML SOLN
65.0000 mL | Freq: Once | INTRAVENOUS | Status: AC | PRN
  Administered 2022-03-18: 65 mL via INTRAVENOUS

## 2022-03-18 MED ORDER — SODIUM CHLORIDE 0.9 % IV SOLN
2.0000 g | Freq: Two times a day (BID) | INTRAVENOUS | Status: DC
  Administered 2022-03-18 (×2): 2 g via INTRAVENOUS
  Filled 2022-03-18 (×2): qty 12.5

## 2022-03-18 NOTE — ED Notes (Signed)
Patient reports no improvement with breathing treatment.  RT called and will come evaluate patient.

## 2022-03-18 NOTE — ED Notes (Signed)
Patient c/o increased SHOB.  States she has "the chills and can't get a good breathe."  SpO2 saturations at 93% on 4 L [place].  Patient states she normally has to do breathing treatments at home due to difficulty breathing.  Patient repositioned in bed to help her sit up to open lungs and allow her to take deeper breaths.

## 2022-03-18 NOTE — ED Notes (Signed)
ED TO INPATIENT HANDOFF REPORT  ED Nurse Name and Phone #: Delorise Shiner [phone]  S Name/Age/Gender Kathleen [name] 76 y.o. female Room/Bed: 030C/030C  Code Status   Code Status: Full Code  Home/SNF/Other Nursing Home Patient oriented to: self, place, time, and situation Is this baseline? Yes   Triage Complete: Triage complete  Chief Complaint Sepsis [facility]) [A41.9]  Triage Note Pt BIB [facility] to [facility] ED. EMS reported patient had weakness on Thursday after taking new medication. Yesterday patient had flu shot , started feeling worse. At 1700 patient was talking to son, she could not find her words, she was stuttering. EMS reported patient had no neuro deficit. Patient use oxygen 4 liters at home. She is from  [facility].   Allergies Allergies  Allergen Reactions   Allopurinol Other (See Comments)    Reaction:  Dizziness    Clindamycin Anaphylaxis and Hives   Pneumococcal 13-Val Conj Vacc Itching, Swelling and Rash   Dronedarone Rash   Brovana [Arformoterol]     Caused muscle pain   Budesonide     Caused extreme joint pain   Entresto [Sacubitril-Valsartan] Other (See Comments)    hypotension   Fosamax [Alendronate Sodium] Nausea Only   Jardiance [Empagliflozin]     Caused a vaginal infection   Meperidine Nausea And Vomiting   Microplegia Msa-Msg [Plegisol]     Loopy,diarrhea   Rosuvastatin Other (See Comments)    Reaction:  Muscle spasms    Tetracycline Hives   Adhesive [Tape] Rash   Lovastatin Rash    Other reaction(s): Muscle Pain    Level of Care/Admitting Diagnosis ED Disposition     ED Disposition  Admit   Condition  --   Comment  Hospital Area: [facility] [[id number]]  Level of Care: Telemetry Medical [[id number]]  May admit patient to [facility] or [facility] if equivalent level of care is available:: Yes  Covid Evaluation: Confirmed COVID Negative  Diagnosis: Sepsis [facility]) [[id number]]  Admitting Physician: Dorcas Carrow  [[id number]]  Attending Physician: Dorcas Carrow [[id number]]  Certification:: I certify this patient will need inpatient services for at least 2 midnights  Estimated Length of Stay: 3          B Medical/Surgery History 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HCC)    CHF (congestive heart failure) (HCC)    Chronic renal insufficiency    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    Concussion 10/04/2021   COPD (chronic obstructive pulmonary disease) (HCC)    Gout    Hypertension    Longstanding persistent atrial fibrillation (HCC)    Nonischemic cardiomyopathy (HCC)    Obesity    Pacemaker    Spontaneous pneumothorax 2013   Past Surgical History:  Procedure Laterality Date   ABDOMINAL HYSTERECTOMY     APPENDECTOMY     ATRIAL FIBRILLATION ABLATION  07/20/2013   by Dr Christin Fudge   AV nodal ablation  11/01/2013   by Dr Christin Fudge, repeated by Dr Wilford Grist   BREAST BIOPSY Bilateral 1997   negative   CARDIAC CATHETERIZATION     CHOLECYSTECTOMY     HERNIA REPAIR     PACEMAKER INSERTION  06/2017   MDT Viva CRT-P implanted by Dr Christin Fudge after AV nodal ablation,  LV lead could not be placed   PACEMAKER INSERTION  05/2020   with lead bundle  A IV Location/Drains/Wounds Patient Lines/Drains/Airways Status     Active Line/Drains/Airways     Name Placement date Placement time Site Days   Peripheral IV 03/17/22 20 G Left;Posterior Wrist 03/17/22  1851  Wrist  1   Peripheral IV 03/17/22 18 G Left Antecubital 03/17/22  1816  Antecubital  1            Intake/Output Last 24 hours  Intake/Output Summary (Last 24 hours) at 03/18/2022 1932 Last data filed at 03/18/2022 0430 Gross per 24 hour  Intake 600 ml  Output --  Net 600 ml    Labs/Imaging Results for orders placed or performed during the hospital encounter of 03/17/22 (from the past 48 hour(s))  Brain  natriuretic peptide     Status: Abnormal   Collection Time: 03/17/22  6:41 PM  Result Value Ref Range   B Natriuretic Peptide 195.9 (H) 0.0 - 100.0 pg/mL    Comment: Performed at [facility], [address], [place], [place] [address]  CBC with Differential     Status: Abnormal   Collection Time: 03/17/22  6:47 PM  Result Value Ref Range   WBC 9.8 4.0 - 10.5 K/uL   RBC 4.27 3.87 - 5.11 MIL/uL   Hemoglobin 11.8 (L) 12.0 - 15.0 g/dL   HCT 60.4 54.0 - 98.1 %   MCV 86.2 80.0 - 100.0 fL   MCH 27.6 26.0 - 34.0 pg   MCHC 32.1 30.0 - 36.0 g/dL   RDW 19.1 47.8 - 29.5 %   Platelets 195 150 - 400 K/uL   nRBC 0.0 0.0 - 0.2 %   Neutrophils Relative % 92 %   Neutro Abs 9.0 (H) 1.7 - 7.7 K/uL   Lymphocytes Relative 3 %   Lymphs Abs 0.3 (L) 0.7 - 4.0 K/uL   Monocytes Relative 4 %   Monocytes Absolute 0.4 0.1 - 1.0 K/uL   Eosinophils Relative 0 %   Eosinophils Absolute 0.0 0.0 - 0.5 K/uL   Basophils Relative 0 %   Basophils Absolute 0.0 0.0 - 0.1 K/uL   Immature Granulocytes 1 %   Abs Immature Granulocytes 0.06 0.00 - 0.07 K/uL    Comment: Performed at [facility], [address], [place], [place] [address]  Comprehensive metabolic panel     Status: Abnormal   Collection Time: 03/17/22  6:47 PM  Result Value Ref Range   Sodium 134 (L) 135 - 145 mmol/L   Potassium 4.1 3.5 - 5.1 mmol/L   Chloride 102 98 - 111 mmol/L   CO2 21 (L) 22 - 32 mmol/L   Glucose, Bld 147 (H) 70 - 99 mg/dL    Comment: Glucose reference range applies only to samples taken after fasting for at least 8 hours.   BUN 8 8 - 23 mg/dL   Creatinine, Ser 8.65 0.44 - 1.00 mg/dL   Calcium 9.0 8.9 - 78.4 mg/dL   Total Protein 6.3 (L) 6.5 - 8.1 g/dL   Albumin 3.4 (L) 3.5 - 5.0 g/dL   AST 15 15 - 41 U/L   ALT 11 0 - 44 U/L   Alkaline Phosphatase 31 (L) 38 - 126 U/L   Total Bilirubin 0.8 0.3 - 1.2 mg/dL   GFR, Estimated >69 >62 mL/min    Comment: (NOTE) Calculated using the CKD-EPI Creatinine Equation (2021)     Anion gap 11 5 - 15    Comment: Performed at [facility], [address], [place], [place] [address]  Lactic acid, plasma     Status: None   Collection Time: 03/17/22  6:47 PM  Result Value Ref Range   Lactic Acid, Venous 0.9 0.5 - 1.9 mmol/L    Comment: Performed at [facility], [address] [place], [place] [address]  Culture, blood (routine x 2)     Status: None (Preliminary result)   Collection Time: 03/17/22  6:47 PM   Specimen: BLOOD  Result Value Ref Range   Specimen Description BLOOD BLOOD LEFT FOREARM    Special Requests      BOTTLES DRAWN AEROBIC AND ANAEROBIC Blood Culture results may not be optimal due to an excessive volume of blood received in culture bottles   Culture      NO GROWTH < 24 HOURS Performed at [facility], [address] [place], [place] [address]    Report Status PENDING   Troponin I (High Sensitivity)     Status: None   Collection Time: 03/17/22  6:47 PM  Result Value Ref Range   Troponin I (High Sensitivity) 10 <18 ng/L    Comment: (NOTE) Elevated high sensitivity troponin I (hsTnI) values and significant  changes across serial measurements may suggest ACS but many other  chronic and acute conditions are known to elevate hsTnI results.  Refer to the "Links" section for chest pain algorithms and additional  guidance. Performed at [facility], [address], [place], [place] [address]   Resp Panel by RT-PCR (Flu A&B, Covid)     Status: None   Collection Time: 03/17/22  6:47 PM   Specimen: Nasal Swab  Result Value Ref Range   SARS Coronavirus 2 by RT PCR NEGATIVE NEGATIVE    Comment: (NOTE) SARS-CoV-2 target nucleic acids are NOT DETECTED.  The SARS-CoV-2 RNA is generally detectable in upper respiratory specimens during the acute phase of infection. The lowest concentration of SARS-CoV-2 viral copies this assay can detect is 138 copies/mL. A negative result does not preclude SARS-Cov-2 infection and should not be  used as the sole basis for treatment or other patient management decisions. A negative result may occur with  improper specimen collection/handling, submission of specimen other than nasopharyngeal swab, presence of viral mutation(s) within the areas targeted by this assay, and inadequate number of viral copies(<138 copies/mL). A negative result must be combined with clinical observations, patient history, and epidemiological information. The expected result is Negative.  Fact Sheet for Patients:  BloggerCourse.com  Fact Sheet for Healthcare Providers:  SeriousBroker.it  This test is no t yet approved or cleared by the [place] FDA and  has been authorized for detection and/or diagnosis of SARS-CoV-2 by FDA under an Emergency Use Authorization (EUA). This EUA will remain  in effect (meaning this test can be used) for the duration of the COVID-19 declaration under Section 564(b)(1) of the Act, 21 U.S.C.section 360bbb-3(b)(1), unless the authorization is terminated  or revoked sooner.       Influenza A by PCR NEGATIVE NEGATIVE   Influenza B by PCR NEGATIVE NEGATIVE    Comment: (NOTE) The Xpert Xpress SARS-CoV-2/FLU/RSV plus assay is intended as an aid in the diagnosis of influenza from Nasopharyngeal swab specimens and should not be used as a sole basis for treatment. Nasal washings and aspirates are unacceptable for Xpert Xpress SARS-CoV-2/FLU/RSV testing.  Fact Sheet for Patients: BloggerCourse.com  Fact Sheet for Healthcare Providers: SeriousBroker.it  This test is not yet approved or cleared by the [place] and has been authorized  for detection and/or diagnosis of SARS-CoV-2 by FDA under an Emergency Use Authorization (EUA). This EUA will remain in effect (meaning this test can be used) for the duration of the COVID-19 declaration under Section 564(b)(1) of the  Act, 21 U.S.C. section 360bbb-3(b)(1), unless the authorization is terminated or revoked.  Performed at [facility], [address] [place], [place] [address]   Culture, blood (routine x 2)     Status: None (Preliminary result)   Collection Time: 03/17/22  7:55 PM   Specimen: BLOOD  Result Value Ref Range   Specimen Description BLOOD BLOOD RIGHT HAND    Special Requests      BOTTLES DRAWN AEROBIC AND ANAEROBIC Blood Culture adequate volume   Culture      NO GROWTH < 24 HOURS Performed at [facility], [address], [place], [place] [address]    Report Status PENDING   Respiratory (~20 pathogens) panel by PCR     Status: None   Collection Time: 03/17/22  9:23 PM   Specimen: Respiratory  Result Value Ref Range   Adenovirus NOT DETECTED NOT DETECTED   Coronavirus 229E NOT DETECTED NOT DETECTED    Comment: (NOTE) The Coronavirus on the Respiratory Panel, DOES NOT test for the novel  Coronavirus (2019 nCoV)    Coronavirus HKU1 NOT DETECTED NOT DETECTED   Coronavirus NL63 NOT DETECTED NOT DETECTED   Coronavirus OC43 NOT DETECTED NOT DETECTED   Metapneumovirus NOT DETECTED NOT DETECTED   Rhinovirus / Enterovirus NOT DETECTED NOT DETECTED   Influenza A NOT DETECTED NOT DETECTED   Influenza B NOT DETECTED NOT DETECTED   Parainfluenza Virus 1 NOT DETECTED NOT DETECTED   Parainfluenza Virus 2 NOT DETECTED NOT DETECTED   Parainfluenza Virus 3 NOT DETECTED NOT DETECTED   Parainfluenza Virus 4 NOT DETECTED NOT DETECTED   Respiratory Syncytial Virus NOT DETECTED NOT DETECTED   Bordetella pertussis NOT DETECTED NOT DETECTED   Bordetella Parapertussis NOT DETECTED NOT DETECTED   Chlamydophila pneumoniae NOT DETECTED NOT DETECTED   Mycoplasma pneumoniae NOT DETECTED NOT DETECTED    Comment: Performed at [facility], [address] [place], [place] [address]  Urinalysis, Routine w reflex microscopic     Status: Abnormal   Collection Time: 03/17/22 10:44 PM  Result  Value Ref Range   Color, Urine YELLOW YELLOW   APPearance HAZY (A) CLEAR   Specific Gravity, Urine 1.012 1.005 - 1.030   pH 7.0 5.0 - 8.0   Glucose, UA NEGATIVE NEGATIVE mg/dL   Hgb urine dipstick NEGATIVE NEGATIVE   Bilirubin Urine NEGATIVE NEGATIVE   Ketones, ur NEGATIVE NEGATIVE mg/dL   Protein, ur NEGATIVE NEGATIVE mg/dL   Nitrite NEGATIVE NEGATIVE   Leukocytes,Ua LARGE (A) NEGATIVE   RBC / HPF 0-5 0 - 5 RBC/hpf   WBC, UA >50 (H) 0 - 5 WBC/hpf   Bacteria, UA NONE SEEN NONE SEEN   Squamous Epithelial / LPF 0-5 0 - 5    Comment: Performed at [facility], [address], [place], [place] [address]  Brain natriuretic peptide     Status: Abnormal   Collection Time: 03/18/22  5:41 AM  Result Value Ref Range   B Natriuretic Peptide 438.3 (H) 0.0 - 100.0 pg/mL    Comment: Performed at [facility], [address], [place], [place] [address]  Troponin I (High Sensitivity)     Status: Abnormal   Collection Time: 03/18/22  5:49 AM  Result Value  Ref Range   Troponin I (High Sensitivity) 2,438 (HH) <18 ng/L    Comment: CRITICAL RESULT CALLED TO, READ BACK BY AND VERIFIED WITH T. DEAN, RN, [phone] 03/18/22, A. RAMSEY (NOTE) Elevated high sensitivity troponin I (hsTnI) values and significant  changes across serial measurements may suggest ACS but many other  chronic and acute conditions are known to elevate hsTnI results.  Refer to the "Links" section for chest pain algorithms and additional  guidance. Performed at [facility], [address] [place], [place] [address]   Creatinine, serum     Status: None   Collection Time: 03/18/22  5:49 AM  Result Value Ref Range   Creatinine, Ser 0.85 0.44 - 1.00 mg/dL   GFR, Estimated >76 >72 mL/min    Comment: (NOTE) Calculated using the CKD-EPI Creatinine Equation (2021) Performed at [facility], [address], [place], [place] [address]   Magnesium     Status: None   Collection Time: 03/18/22  5:49 AM  Result  Value Ref Range   Magnesium 1.9 1.7 - 2.4 mg/dL    Comment: Performed at [facility], [address] [place], [place] [address]  CBG monitoring, ED     Status: Abnormal   Collection Time: 03/18/22  8:32 AM  Result Value Ref Range   Glucose-Capillary 137 (H) 70 - 99 mg/dL    Comment: Glucose reference range applies only to samples taken after fasting for at least 8 hours.  Troponin I (High Sensitivity)     Status: Abnormal   Collection Time: 03/18/22  8:46 AM  Result Value Ref Range   Troponin I (High Sensitivity) 1,869 (HH) <18 ng/L    Comment: CRITICAL VALUE NOTED. VALUE IS CONSISTENT WITH PREVIOUSLY REPORTED/CALLED VALUE (NOTE) Elevated high sensitivity troponin I (hsTnI) values and significant  changes across serial measurements may suggest ACS but many other  chronic and acute conditions are known to elevate hsTnI results.  Refer to the "Links" section for chest pain algorithms and additional  guidance. Performed at [facility], [address], [place], [place] [address]   Troponin I (High Sensitivity)     Status: Abnormal   Collection Time: 03/18/22 11:46 AM  Result Value Ref Range   Troponin I (High Sensitivity) 1,658 (HH) <18 ng/L    Comment: CRITICAL VALUE NOTED. VALUE IS CONSISTENT WITH PREVIOUSLY REPORTED/CALLED VALUE (NOTE) Elevated high sensitivity troponin I (hsTnI) values and significant  changes across serial measurements may suggest ACS but many other  chronic and acute conditions are known to elevate hsTnI results.  Refer to the "Links" section for chest pain algorithms and additional  guidance. Performed at [facility], [address], [place], [place] [address]   Procalcitonin - Baseline     Status: None   Collection Time: 03/18/22 11:46 AM  Result Value Ref Range   Procalcitonin 7.36 ng/mL    Comment:        Interpretation: PCT > 2 ng/mL: Systemic infection (sepsis) is likely, unless other causes are known. (NOTE)       Sepsis  PCT Algorithm           Lower Respiratory Tract                                      Infection PCT Algorithm    ----------------------------     ----------------------------  PCT < 0.25 ng/mL                PCT < 0.10 ng/mL          Strongly encourage             Strongly discourage   discontinuation of antibiotics    initiation of antibiotics    ----------------------------     -----------------------------       PCT 0.25 - 0.50 ng/mL            PCT 0.10 - 0.25 ng/mL               OR       >80% decrease in PCT            Discourage initiation of                                            antibiotics      Encourage discontinuation           of antibiotics    ----------------------------     -----------------------------         PCT >= 0.50 ng/mL              PCT 0.26 - 0.50 ng/mL               AND       <80% decrease in PCT              Encourage initiation of                                             antibiotics       Encourage continuation           of antibiotics    ----------------------------     -----------------------------        PCT >= 0.50 ng/mL                  PCT > 0.50 ng/mL               AND         increase in PCT                  Strongly encourage                                      initiation of antibiotics    Strongly encourage escalation           of antibiotics                                     -----------------------------                                           PCT <= 0.25 ng/mL  OR                                        > 80% decrease in PCT                                      Discontinue / Do not initiate                                             antibiotics  Performed at [facility], [place] [address] [place], [place] [address]    CT Angio Chest Pulmonary Embolism (PE) W or WO Contrast  Result Date: 03/18/2022 CLINICAL DATA:  High clinical suspicion for PE EXAM: CT ANGIOGRAPHY  CHEST WITH CONTRAST TECHNIQUE: Multidetector CT imaging of the chest was performed using the standard protocol during bolus administration of intravenous contrast. Multiplanar CT image reconstructions and MIPs were obtained to evaluate the vascular anatomy. RADIATION DOSE REDUCTION: This exam was performed according to the departmental dose-optimization program which includes automated exposure control, adjustment of the mA and/or kV according to patient size and/or use of iterative reconstruction technique. CONTRAST:  48mL OMNIPAQUE IOHEXOL 350 MG/ML SOLN COMPARISON:  Previous CT done on 09/03/2021, chest radiograph done on 03/17/2022 FINDINGS: Cardiovascular: There are scattered coronary artery calcifications. Pacemaker battery is seen in the left infraclavicular region. Contrast density in the lumen of thoracic aorta is less than adequate to evaluate for dissection. There is ectasia of main pulmonary artery measuring 3.8 cm suggesting pulmonary arterial hypertension. There are no intraluminal filling defects in central pulmonary artery branches. Motion artifacts limit evaluation of small peripheral branches. Mediastinum/Nodes: No significant lymphadenopathy seen. Lungs/Pleura: Centrilobular emphysema is seen. Small linear densities are seen in lower lung fields suggesting scarring or subsegmental atelectasis. There is no focal pulmonary consolidation. There is no pleural effusion or pneumothorax. There is narrowing of AP diameter of trachea and mainstem bronchi suggesting possible bronchospasm. Upper Abdomen: No acute findings are seen. Musculoskeletal: Degenerative changes are noted in both shoulders. Degenerative changes are noted in lower thoracic spine. Review of the MIP images confirms the above findings. IMPRESSION: There is no evidence of pulmonary artery embolism. There is ectasia of main pulmonary artery suggesting pulmonary arterial hypertension. Coronary artery calcifications are seen. Centrilobular  emphysema. Linear densities in the lower lung fields suggest scarring or subsegmental atelectasis. Electronically Signed   By: Elmer Picker M.D.   On: 03/18/2022 13:09   CT Head Wo Contrast  Result Date: 03/17/2022 CLINICAL DATA:  Mental status change, unknown cause. Fever and weakness. EXAM: CT HEAD WITHOUT CONTRAST TECHNIQUE: Contiguous axial images were obtained from the base of the skull through the vertex without intravenous contrast. RADIATION DOSE REDUCTION: This exam was performed according to the departmental dose-optimization program which includes automated exposure control, adjustment of the mA and/or kV according to patient size and/or use of iterative reconstruction technique. COMPARISON:  10/05/2021. FINDINGS: Brain: No acute intracranial hemorrhage, midline shift or mass effect. No extra-axial fluid collection. Diffuse atrophy is noted. Gray-white matter differentiation is within normal limits. No hydrocephalus. Vascular: No hyperdense vessel or unexpected calcification. Skull: No acute fracture. Sinuses/Orbits: There is partial opacification of the sphenoid sinus on the left and  ethmoid air cells on the right. No acute orbital abnormality. Other: None. IMPRESSION: 1. No acute intracranial process. 2. Atrophy. Electronically Signed   By: Thornell Sartorius M.D.   On: 03/17/2022 20:59   DG Chest Port 1 View  Result Date: 03/17/2022 CLINICAL DATA:  Fever. Weakness. EXAM: PORTABLE CHEST 1 VIEW COMPARISON:  11/19/2021, CT 09/03/2021 FINDINGS: Multi lead left-sided pacemaker in place. Stable cardiomegaly. Unchanged mediastinal contours with aortic atherosclerosis. Chronic hyperinflation. Increased bronchial thickening from prior. Left lung base assessment is suboptimal due to overlapping soft tissue structures. No large pleural effusion. No pneumothorax. Chronic right shoulder arthropathy. IMPRESSION: 1. Increased bronchial thickening from prior, may be infectious or inflammatory. 2. Chronic  hyperinflation.  Unchanged cardiomegaly. Electronically Signed   By: Narda Rutherford M.D.   On: 03/17/2022 19:08    Pending Labs Unresulted Labs (From admission, onward)     Start     Ordered   03/19/22 0500  Procalcitonin  Daily at 5am,   R      03/18/22 1219   03/19/22 0500  Renal function panel  Tomorrow morning,   R        03/18/22 1252   03/19/22 0500  CBC  Tomorrow morning,   R        03/18/22 1252   03/19/22 0500  Magnesium  Tomorrow morning,   R        03/18/22 1252   03/18/22 1258  MRSA Next Gen by PCR, Nasal  Once,   R        03/18/22 1257   03/18/22 0500  Creatinine, serum  Daily at 5am,   R      03/17/22 2311   03/17/22 2357  Legionella Pneumophila Serogp 1 Ur Ag  Once,   R        03/17/22 2356   03/17/22 2357  Strep pneumoniae urinary antigen  Once,   R        03/17/22 2356   03/17/22 2357  Expectorated Sputum Assessment w Gram Stain, Rflx to Resp Cult  Once,   R        03/17/22 2356            Vitals/Pain Today's Vitals   03/18/22 1700 03/18/22 1801 03/18/22 1845 03/18/22 1900  BP: (!) 99/49 (!) 94/54  (!) 98/49  Pulse: 83 84 87 86  Resp:  20  20  Temp:      TempSrc:      SpO2: 100% 100% 100% 99%  Weight:      Height:      PainSc:        Isolation Precautions Droplet precaution  Medications Medications  dofetilide (TIKOSYN) capsule 250 mcg (250 mcg Oral Given 03/18/22 1412)  revefenacin (YUPELRI) nebulizer solution 175 mcg (175 mcg Nebulization Not Given 03/18/22 0834)  rivaroxaban (XARELTO) tablet 20 mg (20 mg Oral Given 03/18/22 1734)  amitriptyline (ELAVIL) tablet 25 mg (25 mg Oral Given 03/18/22 0311)  Bempedoic Acid TABS 1 tablet (1 tablet Oral Not Given 03/18/22 0951)  azelastine (ASTELIN) 0.1 % nasal spray 2 spray (2 sprays Each Nare Not Given 03/18/22 0951)  febuxostat (ULORIC) tablet 40 mg (40 mg Oral Not Given 03/18/22 0951)  magnesium oxide (MAG-OX) tablet 400 mg (400 mg Oral Given 03/18/22 0311)  cholecalciferol (VITAMIN D3) 25 MCG  (1000 UNIT) tablet 2,000 Units (2,000 Units Oral Given 03/18/22 0311)  acetaminophen (TYLENOL) tablet 650 mg (650 mg Oral Given 03/18/22 0314)    Or  acetaminophen (TYLENOL) suppository  650 mg ( Rectal See Alternative 03/18/22 0314)  albuterol (PROVENTIL) (2.5 MG/3ML) 0.083% nebulizer solution 2.5 mg (2.5 mg Nebulization Given 03/18/22 1806)  guaiFENesin-dextromethorphan (ROBITUSSIN DM) 100-10 MG/5ML syrup 5 mL (has no administration in time range)  ceFEPIme (MAXIPIME) 2 g in sodium chloride 0.9 % 100 mL IVPB (0 g Intravenous Stopped 03/18/22 1405)  vancomycin (VANCOREADY) IVPB 1250 mg/250 mL (has no administration in time range)  acetaminophen (TYLENOL) tablet 1,000 mg (1,000 mg Oral Given 03/17/22 1849)  vancomycin (VANCOREADY) IVPB 2000 mg/400 mL (0 mg Intravenous Stopped 03/18/22 0109)  ceFEPIme (MAXIPIME) 2 g in sodium chloride 0.9 % 100 mL IVPB (0 g Intravenous Stopped 03/17/22 2034)  sodium chloride 0.9 % bolus 500 mL (0 mLs Intravenous Stopped 03/18/22 1507)  iohexol (OMNIPAQUE) 350 MG/ML injection 65 mL (65 mLs Intravenous Contrast Given 03/18/22 1253)    Mobility manual wheelchair High fall risk   R Recommendations: See Admitting Provider Note  Report given to:   Additional Notes:

## 2022-03-18 NOTE — ED Notes (Signed)
Patient assisted to hospital bed for comfort.  New gown provided.

## 2022-03-18 NOTE — Progress Notes (Signed)
Pharmacy Antibiotic Note  Kathleen [name] is a 76 y.o. female admitted on 03/17/2022 with sepsis.  Pharmacy has been consulted for cefepime and vancomycin dosing.  Patient presented with new onset body ache, fever, fatigue, and mild confusion after receiving her flu shot. No respiratory pathogens noted on panel. Now presumed to have sepsis. Has received vancomycin 2000 mg once, cefepime 2 g once, and ceftriaxone 2 g once.   Plan: Initiate cefepime 2 g every 12 hours. Discontinue ceftriaxone. Initiate vancomycin 1250 mg every 36 hours (eAUC 473).  Monitor renal function, CBC, s/sx of infection, and vancomycin levels at steady state.   Height: 5\' 1"  (154.9 cm) Weight: 87.5 kg (193 lb) IBW/kg (Calculated) : 47.8  Temp (24hrs), Avg:100 F (37.8 C), Min:98 F (36.7 C), Max:103.1 F (39.5 C)  Recent Labs  Lab 03/17/22 1847 03/18/22 0549  WBC 9.8  --   CREATININE 0.83 0.85  LATICACIDVEN 0.9  --     Estimated Creatinine Clearance: 57.5 mL/min (by C-G formula based on SCr of 0.85 mg/dL).    Allergies  Allergen Reactions   Allopurinol Other (See Comments)    Reaction:  Dizziness    Clindamycin Anaphylaxis and Hives   Pneumococcal 13-Val Conj Vacc Itching, Swelling and Rash   Dronedarone Rash   Brovana [Arformoterol]     Caused muscle pain   Budesonide     Caused extreme joint pain   Entresto [Sacubitril-Valsartan] Other (See Comments)    hypotension   Fosamax [Alendronate Sodium] Nausea Only   Jardiance [Empagliflozin]     Caused a vaginal infection   Meperidine Nausea And Vomiting   Microplegia Msa-Msg [Plegisol]     Loopy,diarrhea   Rosuvastatin Other (See Comments)    Reaction:  Muscle spasms    Tetracycline Hives   Adhesive [Tape] Rash   Lovastatin Rash    Other reaction(s): Muscle Pain    Antimicrobials this admission: Vancomycin 2000 mg once (10/10)  Cefepime 2 g once (10/10) Ceftriaxone 2 g once (10/11)   Microbiology results: 10/10 BCx: collected  Thank  you for allowing pharmacy to be a part of this patient's care.  Sinda Du, PharmD Candidate 03/18/2022 12:43 PM

## 2022-03-18 NOTE — ED Notes (Signed)
Patient continues to complain of chills.  Temperature checked.  Patient provided with warm blankets for comfort

## 2022-03-18 NOTE — ED Notes (Signed)
MD returned page  Made aware of Troponin of 2,438, no current chest pain, no additional SOB, and new EKG obtain.  No new orders received.

## 2022-03-18 NOTE — Consult Note (Signed)
Cardiology Consultation   Patient ID: Kathleen [name] MRN: [id number]; DOB: 1945-07-07  Admit date: 03/17/2022 Date of Consult: 03/18/2022  PCP:  [name], MD   [facility] Providers Cardiologist:  None  Advanced Heart Failure:  Arvilla Meres, MD     Patient Profile:   Kathleen [name] is a 76 y.o. 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 Alanda Slim.  History of Present Illness:   Kathleen [name] is a 76 year old female with past medical history noted above.  She has been followed by Dr. Jeralyn Ruths ([facility]) as well as Dr. Gala Romney in the heart failure clinic.  She moved to [facility] back 02/2021 to [facility] and was referred by her cardiologist at [facility] to establish care with a heart failure clinic.  Notes indicate previous echo with an EF of 40% but recovered.  Echocardiogram at [facility] 01/2021 showed EF greater than 55% with normal RV function.  She has been maintained on Tikosyn.  Right and left heart cath 01/2020, RA 12, PA mean 29, PCWP 13, CI 2.2 and angiogram without significant stenosis.  She is also followed by Dr. Francine Graven with pulmonary for BOOP.  At her initial visit 02/2021 it was noted her weight was up after being discharged from SNF.  Reported shortness of breath at baseline but was worse at that visit.  Had been recently resumed on Entresto.  Has failed Jardiance in the setting of frequent UTIs.  She had a CardioMEMS but was not using frequently.  Wore a ZIO 04/2021 showing predominantly A-fib with V pacing with intermittent episodes of sinus rhythm with V pacing with average heart rate in the 70s.  She was last seen in the heart failure clinic 09/2021 and reported being at her baseline.  She is chronically  on 4 L nasal cannula.  Pacer was interrogated at that visit showing <0.1% AF, no VT. he was continued on torsemide 20 mg twice weekly, losartan 25 mg daily, spironolactone 25 mg daily, Tikosyn and Xarelto.   Last seen in the A-fib clinic 12/2021.  She was noted to be a sensed, V paced.  She had had a recent fall with head trauma at the end of April with a subarachnoid hemorrhage that resolved after holding Xarelto and did not require reversal agents.  Instructed to continue Tikosyn and Xarelto with plans to see back in 1 year for Tikosyn surveillance.  Presented to the ED on 10/10 with complaints of generalized weakness, confusion, fever and myalgias after receiving the flu shot the afternoon prior.  Called her son the day of admission and seemed confused, he was worried she was having a stroke.  EMS was called.  While in route she demonstrated no focal neurological symptoms.  In the ED she was noted to be febrile with a temperature of 103.  Labs were notable for sodium 134, potassium 4.1, creatinine 0.8, BNP 195, hsTn 10>> 2438>> 1869>> 1658, lactic acid 0.9, hemoglobin 11.8, WBC 9.8.  Respiratory panel negative.  Blood cultures obtained and started on IV antibiotics, vanc/cefepime.  EKG notable for sinus rhythm, atrial sensed/V paced. Admitted to internal medicine for further management.  Cardiology asked to evaluate in regards to elevated troponin.  In talking with the patient she has not had an anginal  symptoms, though she is quite limited in her physical activity. Reports compliance with her home medications PTA.   Underwent CT angio with no evidence of PE, suggestion of pulmonary arterial hypertension, coronary artery calcifications, emphysema with lower lung field suggesting scarring versus subsegmental atelectasis.   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    Concussion 10/04/2021   COPD (chronic obstructive pulmonary disease) (HCC)    Gout    Hypertension    Longstanding persistent atrial fibrillation (HCC)    Nonischemic cardiomyopathy (HCC)    Obesity    Pacemaker    Spontaneous pneumothorax 2013    Past Surgical History:  Procedure Laterality Date   ABDOMINAL HYSTERECTOMY     APPENDECTOMY     ATRIAL FIBRILLATION ABLATION  07/20/2013   by Dr Christin Fudge   AV nodal ablation  11/01/2013   by Dr Christin Fudge, repeated by Dr Wilford Grist   BREAST BIOPSY Bilateral 1997   negative   CARDIAC CATHETERIZATION     CHOLECYSTECTOMY     HERNIA REPAIR     PACEMAKER INSERTION  06/2017   MDT Viva CRT-P implanted by Dr Christin Fudge after AV nodal ablation,  LV lead could not be placed   PACEMAKER INSERTION  05/2020   with lead bundle     Home Medications:  Prior to Admission medications   Medication Sig Start Date End Date Taking? Authorizing Provider  acetaminophen (TYLENOL) 650 MG CR tablet Take 1,300 mg by mouth as needed for pain.   Yes [provider]  albuterol (PROVENTIL) (2.5 MG/3ML) 0.083% nebulizer solution Take 2.5 mg by nebulization every 6 (six) hours as needed for shortness of breath or wheezing. 01/28/22  Yes [provider]  amitriptyline (ELAVIL) 25 MG tablet Take 1 tablet (25 mg total) by mouth at bedtime. 03/12/22  Yes Camara, Amalia Hailey, MD  azelastine (ASTELIN) 0.1 % nasal spray Place 2 sprays into both nostrils 2 (two) times daily. 02/17/22  Yes Oretha Milch, MD  Bempedoic Acid (NEXLETOL) 180 MG TABS Take 1 tablet by mouth daily. 08/08/21  Yes [name], MD  Cholecalciferol (VITAMIN D3) 50 MCG (2000 UT) TABS Take 2,000 Units by mouth at bedtime.   Yes [provider]  cyanocobalamin (,VITAMIN B-12,) 1000 MCG/ML injection Inject 1 mL (1,000 mcg total) into the muscle every 30 (thirty) days. 11/19/21  Yes [name], MD  docusate sodium (COLACE) 100 MG capsule Take 100 mg by mouth 2 (two) times daily.   Yes [provider]  dofetilide (TIKOSYN) 250 MCG capsule Take 1 capsule (250 mcg total) by mouth in the morning and at bedtime. 10/02/21 10/02/22 Yes Newman Nip, NP  febuxostat (ULORIC) 40 MG tablet TAKE 1 TABLET(40 MG) BY MOUTH EVERY NIGHT Patient taking differently: Take 40 mg by mouth at bedtime. 03/05/22  Yes [name], MD  fluticasone-salmeterol (ADVAIR HFA) 230-21 MCG/ACT inhaler Inhale 2 puffs into the lungs daily.   Yes [provider]  linaclotide (LINZESS) 145 MCG CAPS capsule Take 145 mcg by mouth daily as needed (constipation).   Yes [provider]  losartan (COZAAR) 25 MG tablet TAKE 1 TABLET(25 MG) BY MOUTH AT BEDTIME Patient taking differently: Take 25 mg by mouth at bedtime. 10/01/21  Yes  Bensimhon, [name], MD  Magnesium 500 MG TABS Take 500 mg by mouth at bedtime.   Yes [provider]  mupirocin ointment (BACTROBAN) 2 % Apply 1 application. topically 2 (two) times daily. Patient taking differently: Apply 1 application  topically 2 (two) times daily as needed (wound care). 09/09/21  Yes [name], MD  nystatin cream (MYCOSTATIN) Apply to affected area 2 times daily Patient taking differently: Apply 1 Application topically 2 (two) times daily as needed for dry skin. 09/08/21  Yes [name], MD  OXYGEN Inhale 4.5 L into the lungs continuous. Use with resmed ventilator   Yes [provider]  rivaroxaban (XARELTO) 20 MG TABS tablet Take 1 tablet (20 mg total) by mouth daily with supper. 12/29/21  Yes Bensimhon, [name], MD  spironolactone (ALDACTONE) 25 MG tablet TAKE 1 TABLET(25 MG) BY MOUTH EVERY MORNING Patient taking differently: Take 25 mg by mouth daily. 10/28/21  Yes Bensimhon, [name], MD  torsemide (DEMADEX) 20 MG tablet Take  (1 Tab) Monday and Friday Patient taking differently: Take 20 mg by mouth 2 (two) times a  week. Take  (1 Tab) Monday and Friday 06/24/21  Yes Bensimhon, [name], MD  traMADol (ULTRAM) 50 MG tablet Take 50 mg by mouth every 6 (six) hours as needed for pain. 02/02/22  Yes [provider]  YUPELRI 175 MCG/3ML nebulizer solution USE 1 VIAL IN NEBULIZER DAILY - DO NOT MIX WITH OTHER NEB MEDS,USE BEFORE OR AFTER Patient taking differently: Take 175 mcg by nebulization daily. 04/02/21  Yes Martina Sinner, MD    Inpatient Medications: Scheduled Meds:  amitriptyline  25 mg Oral QHS   azelastine  2 spray Each Nare BID   Bempedoic Acid  1 tablet Oral Daily   cholecalciferol  2,000 Units Oral QHS   dofetilide  250 mcg Oral BID   febuxostat  40 mg Oral Daily   magnesium oxide  400 mg Oral QHS   revefenacin  175 mcg Nebulization Daily   rivaroxaban  20 mg Oral Q supper   Continuous Infusions:  ceFEPime (MAXIPIME) IV Stopped (03/18/22 1405)   [START ON 03/19/2022] vancomycin     PRN Meds: acetaminophen **OR** acetaminophen, albuterol, guaiFENesin-dextromethorphan  Allergies:    Allergies  Allergen Reactions   Allopurinol Other (See Comments)    Reaction:  Dizziness    Clindamycin Anaphylaxis and Hives   Pneumococcal 13-Val Conj Vacc Itching, Swelling and Rash   Dronedarone Rash   Brovana [Arformoterol]     Caused muscle pain   Budesonide     Caused extreme joint pain   Entresto [Sacubitril-Valsartan] Other (See Comments)    hypotension   Fosamax [Alendronate Sodium] Nausea Only   Jardiance [Empagliflozin]     Caused a vaginal infection   Meperidine Nausea And Vomiting   Microplegia Msa-Msg [Plegisol]     Loopy,diarrhea   Rosuvastatin Other (See Comments)    Reaction:  Muscle spasms    Tetracycline Hives   Adhesive [Tape] Rash   Lovastatin Rash    Other reaction(s): Muscle Pain    Social History:   Social History   Socioeconomic History   Marital status: Widowed    Spouse name: Not on file   Number of children: Not on file   Years of education:  Not on file   Highest education level: Not on file  Occupational History   Occupation: retired  Tobacco Use   Smoking status: Former    Packs/day: 1.00  Years: 20.00    Total pack years: 20.00    Types: Cigarettes    Quit date: 06/09/1987    Years since quitting: 34.7    Passive exposure: Past   Smokeless tobacco: Never   Tobacco comments:    Former smoker 12/25/21  Vaping Use   Vaping Use: Never used  Substance and Sexual Activity   Alcohol use: No    Alcohol/week: 0.0 standard drinks of alcohol   Drug use: No   Sexual activity: Not Currently  Other Topics Concern   Not on file  Social History Narrative   Pt lives in [place] alone.  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  Her son works in [employer].   Social Determinants of Health   Financial Resource Strain: Low Risk  (07/26/2018)   Overall Financial Resource Strain (CARDIA)    Difficulty of Paying Living Expenses: Not hard at all  Food Insecurity: No Food Insecurity (07/26/2018)   Hunger Vital Sign    Worried About Running Out of Food in the Last Year: Never true    Ran Out of Food in the Last Year: Never true  Transportation Needs: No Transportation Needs (07/26/2018)   PRAPARE - Hydrologist (Medical): No    Lack of Transportation (Non-Medical): No  Physical Activity: Insufficiently Active (07/26/2018)   Exercise Vital Sign    Days of Exercise per Week: 2 days    Minutes of Exercise per Session: 20 min  Stress: Stress Concern Present (07/26/2018)   [place]    Feeling of Stress : Rather much  Social Connections: Socially Isolated (07/26/2018)   Social Connection and Isolation Panel [NHANES]    Frequency of Communication with Friends and Family: Once a week    Frequency of Social Gatherings with Friends and Family: Once a week    Attends Religious Services: Never    Marine scientist or Organizations:  No    Attends Archivist Meetings: Never    Marital Status: Widowed  Intimate Partner Violence: Not At Risk (07/26/2018)   Humiliation, Afraid, Rape, and Kick questionnaire    Fear of Current or Ex-Partner: No    Emotionally Abused: No    Physically Abused: No    Sexually Abused: No    Family History:    Family History  Problem Relation Age of Onset   Breast cancer Mother 17       3 different times   Pancreatic cancer Mother    Emphysema Father    Healthy Brother    Breast cancer Cousin    Valvular heart disease Son    Obesity Daughter    Colon cancer Neg Hx    Esophageal cancer Neg Hx    Stomach cancer Neg Hx    Inflammatory bowel disease Neg Hx    Liver disease Neg Hx    Rectal cancer Neg Hx      ROS:  Please see the history of present illness.   All other ROS reviewed and negative.     Physical Exam/Data:   Vitals:   03/18/22 0945 03/18/22 1058 03/18/22 1145 03/18/22 1315  BP: (!) 98/57 (!) 85/40  (!) 91/43  Pulse: 83 82  86  Resp: 20 18  18   Temp:   98.2 F (36.8 C)   TempSrc:   Oral   SpO2: 98% 100%  100%  Weight:      Height:  Intake/Output Summary (Last 24 hours) at 03/18/2022 1430 Last data filed at 03/18/2022 0430 Gross per 24 hour  Intake 600 ml  Output --  Net 600 ml      03/17/2022    6:43 PM 03/12/2022    3:46 PM 02/17/2022   10:51 AM  Last 3 Weights  Weight (lbs) 193 lb 194 lb 194 lb 12.8 oz  Weight (kg) 87.544 kg 87.998 kg 88.361 kg     Body mass index is 36.47 kg/m.  General:  Obese, ill-appearing older female HEENT: normal Neck: no JVD Vascular: No carotid bruits; Distal pulses 2+ bilaterally Cardiac:  normal S1, S2; RRR; no murmur  Lungs:  Course breath sounds Abd: soft, nontender, no hepatomegaly  Ext: no edema Musculoskeletal:  No deformities, BUE and BLE strength normal and equal Skin: warm and dry  Neuro:  CNs 2-12 intact, no focal abnormalities noted Psych:  Normal affect   EKG:  The EKG was  personally reviewed and demonstrates:  Sinus Rhythm A-sensed/v-paced  Relevant CV Studies:  [facility] 01/2020  RA - 14, 8 mean 12  RV 40, 10  PA 43,15 mean 29  PCWP - 19, 7 mean 13  Cardiac Index 2.2   Coronary angiogram without any stenosis    Echo: 01/2020  INTERPRETATION ---------------------------------------------------------------    MILD LV DYSFUNCTION (See above) WITH MILD LVH    NORMAL LA PRESSURES WITH DIASTOLIC DYSFUNCTION    NORMAL RIGHT VENTRICULAR SYSTOLIC FUNCTION    VALVULAR REGURGITATION: TRIVIAL MR, TRIVIAL PR, TRIVIAL TR    NO VALVULAR STENOSIS    SMALL PERICARDIAL EFFUSION (See above)   Laboratory Data:  High Sensitivity Troponin:   Recent Labs  Lab 03/17/22 1847 03/18/22 0549 03/18/22 0846 03/18/22 1146  TROPONINIHS 10 2,438* 1,869* 1,658*     Chemistry Recent Labs  Lab 03/17/22 1847 03/18/22 0549  NA 134*  --   K 4.1  --   CL 102  --   CO2 21*  --   GLUCOSE 147*  --   BUN 8  --   CREATININE 0.83 0.85  CALCIUM 9.0  --   MG  --  1.9  GFRNONAA >60 >60  ANIONGAP 11  --     Recent Labs  Lab 03/17/22 1847  PROT 6.3*  ALBUMIN 3.4*  AST 15  ALT 11  ALKPHOS 31*  BILITOT 0.8   Lipids No results for input(s): "CHOL", "TRIG", "HDL", "LABVLDL", "LDLCALC", "CHOLHDL" in the last 168 hours.  Hematology Recent Labs  Lab 03/17/22 1847  WBC 9.8  RBC 4.27  HGB 11.8*  HCT 36.8  MCV 86.2  MCH 27.6  MCHC 32.1  RDW 14.1  PLT 195   Thyroid No results for input(s): "TSH", "FREET4" in the last 168 hours.  BNP Recent Labs  Lab 03/17/22 1841  BNP 195.9*    DDimer No results for input(s): "DDIMER" in the last 168 hours.   Radiology/Studies:  CT Angio Chest Pulmonary Embolism (PE) W or WO Contrast  Result Date: 03/18/2022 CLINICAL DATA:  High clinical suspicion for PE EXAM: CT ANGIOGRAPHY CHEST WITH CONTRAST TECHNIQUE: Multidetector CT imaging of the chest was performed using the standard protocol during bolus administration of intravenous  contrast. Multiplanar CT image reconstructions and MIPs were obtained to evaluate the vascular anatomy. RADIATION DOSE REDUCTION: This exam was performed according to the departmental dose-optimization program which includes automated exposure control, adjustment of the mA and/or kV according to patient size and/or use of iterative reconstruction technique. CONTRAST:  65mL OMNIPAQUE  IOHEXOL 350 MG/ML SOLN COMPARISON:  Previous CT done on 09/03/2021, chest radiograph done on 03/17/2022 FINDINGS: Cardiovascular: There are scattered coronary artery calcifications. Pacemaker battery is seen in the left infraclavicular region. Contrast density in the lumen of thoracic aorta is less than adequate to evaluate for dissection. There is ectasia of main pulmonary artery measuring 3.8 cm suggesting pulmonary arterial hypertension. There are no intraluminal filling defects in central pulmonary artery branches. Motion artifacts limit evaluation of small peripheral branches. Mediastinum/Nodes: No significant lymphadenopathy seen. Lungs/Pleura: Centrilobular emphysema is seen. Small linear densities are seen in lower lung fields suggesting scarring or subsegmental atelectasis. There is no focal pulmonary consolidation. There is no pleural effusion or pneumothorax. There is narrowing of AP diameter of trachea and mainstem bronchi suggesting possible bronchospasm. Upper Abdomen: No acute findings are seen. Musculoskeletal: Degenerative changes are noted in both shoulders. Degenerative changes are noted in lower thoracic spine. Review of the MIP images confirms the above findings. IMPRESSION: There is no evidence of pulmonary artery embolism. There is ectasia of main pulmonary artery suggesting pulmonary arterial hypertension. Coronary artery calcifications are seen. Centrilobular emphysema. Linear densities in the lower lung fields suggest scarring or subsegmental atelectasis. Electronically Signed   By: Ernie Avena M.D.    On: 03/18/2022 13:09   CT Head Wo Contrast  Result Date: 03/17/2022 CLINICAL DATA:  Mental status change, unknown cause. Fever and weakness. EXAM: CT HEAD WITHOUT CONTRAST TECHNIQUE: Contiguous axial images were obtained from the base of the skull through the vertex without intravenous contrast. RADIATION DOSE REDUCTION: This exam was performed according to the departmental dose-optimization program which includes automated exposure control, adjustment of the mA and/or kV according to patient size and/or use of iterative reconstruction technique. COMPARISON:  10/05/2021. FINDINGS: Brain: No acute intracranial hemorrhage, midline shift or mass effect. No extra-axial fluid collection. Diffuse atrophy is noted. Gray-white matter differentiation is within normal limits. No hydrocephalus. Vascular: No hyperdense vessel or unexpected calcification. Skull: No acute fracture. Sinuses/Orbits: There is partial opacification of the sphenoid sinus on the left and ethmoid air cells on the right. No acute orbital abnormality. Other: None. IMPRESSION: 1. No acute intracranial process. 2. Atrophy. Electronically Signed   By: Thornell Sartorius M.D.   On: 03/17/2022 20:59   DG Chest Port 1 View  Result Date: 03/17/2022 CLINICAL DATA:  Fever. Weakness. EXAM: PORTABLE CHEST 1 VIEW COMPARISON:  11/19/2021, CT 09/03/2021 FINDINGS: Multi lead left-sided pacemaker in place. Stable cardiomegaly. Unchanged mediastinal contours with aortic atherosclerosis. Chronic hyperinflation. Increased bronchial thickening from prior. Left lung base assessment is suboptimal due to overlapping soft tissue structures. No large pleural effusion. No pneumothorax. Chronic right shoulder arthropathy. IMPRESSION: 1. Increased bronchial thickening from prior, may be infectious or inflammatory. 2. Chronic hyperinflation.  Unchanged cardiomegaly. Electronically Signed   By: Narda Rutherford M.D.   On: 03/17/2022 19:08     Assessment and Plan:   Kathleen [name] is a 76 y.o. 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 Alanda Slim.  Elevated Troponin -- in the setting of acute illness, no anginal symptoms. hsTn peaked at 2438. EKG sinus with V-pacing. Unclear etiology of elevated troponin, though suspect likely demand in the setting of illness, myocarditis? -- echo pending, if decline in EF will need further ischemic  work up  Sepsis Suspected PNA BOOP -- on chronic 4L [place], follows with pulmonary as an outpatient -- respiratory panel negative. BC pending. CT negative for PE suggestion of pulmonary arterial hypertension, emphysema with lower lung field suggesting scarring versus subsegmental atelectasis -- antibiotics per primary team  Persistent Afib S/p AVN ablation with PPM '17 -- maintaining sinus rhythm -- continue Tikosyn and xarelto    HFpEF -- last echo at [facility] with preserved LV function, does not appear volume overloaded on exam -- repeat echo pending  Hypotension -- likely from hypovolemia/sepsis -- home BP meds held  Risk Assessment/Risk Scores:  [id number]}   TIMI Risk Score for Unstable Angina or Non-ST Elevation MI:   The patient's TIMI risk score is 2, which indicates a 8% risk of all cause mortality, new or recurrent myocardial infarction or need for urgent revascularization in the next 14 days.{  CHA2DS2-VASc Score = 5  This indicates a 7.2% annual risk of stroke. The patient's score is based upon: CHF History: 1 HTN History: 1 Diabetes History: 0 Stroke History: 0 Vascular Disease History: 0 Age Score: 2 Gender Score: 1    For questions or updates, please contact [facility] Please consult [email or web address] for contact info under    Signed, Laverda Page, NP   03/18/2022 2:30 PM

## 2022-03-18 NOTE — ED Notes (Addendum)
Critical lab received.  Patient has no current complaints of chest pain or SOB.  Reports only current pain "is in my hips."  New EKG obtain at this time.  Admit MD has been paged.  Waiting for response.   Will continue to monitor patient

## 2022-03-18 NOTE — ED Notes (Signed)
Pt CBG 137 mg/dL.  Verified with [place], Therapist, sports.  Glucometer did not transfer data to chart.

## 2022-03-18 NOTE — Progress Notes (Signed)
PROGRESS NOTE  Kathleen [name] [id number] DOB: [birth year]   PCP: [name], MD  Patient is from: Home.  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  In ED, febrile to 103.  WBC 9.8.  CXR with bilateral lower lobe opacity but mostly chronic.  CT head without acute finding.  COVID-19 and influenza PCR nonreactive.  Cultures drawn.  She was started on broad-spectrum antibiotics, and admitted.  Patient's troponin trended from 03-2499 overnight.  She had no chest pain or acute ischemic finding on EKG.  Cardiology consulted the next morning.  Subjective: Seen and examined earlier this morning.  No major events overnight of this morning.  She has no chest pain.  Dry hacking cough.  She also reports shortness of breath and generalized weakness.  Denies GI or UTI symptoms.  Objective: Vitals:   03/18/22 0814 03/18/22 0945 03/18/22 1058 03/18/22 1145  BP:  (!) 98/57 (!) 85/40   Pulse:  83 82   Resp:  20 18   Temp:    98.2 F (36.8 C)  TempSrc:    Oral  SpO2: 99% 98% 100%   Weight:      Height:        Examination:  GENERAL: No apparent distress.  Nontoxic. HEENT: MMM.  Vision and hearing grossly intact.  NECK: Supple.  No apparent JVD.  RESP:  No IWOB.  Fair aeration bilaterally.  Dry cough noted during exam CVS:  RRR. Heart sounds normal.  ABD/GI/GU: BS+. Abd soft, NTND.  MSK/EXT:  Moves extremities. No apparent deformity. No edema.  SKIN: no apparent skin lesion or wound NEURO: Awake, alert and oriented appropriately.  No apparent focal neuro deficit. PSYCH: Calm. Normal affect.   Procedures:  None  Microbiology  summarized: [id number] and influenza PCR nonreactive. Full RVP negative. Blood cultures pending  Assessment and plan: Active Problems:   HTN (hypertension)   COPD (chronic obstructive pulmonary disease) (HCC)   Atrial fibrillation (HCC)   (HFpEF) heart failure with preserved ejection fraction (HCC)   Sleep apnea   Type 2 diabetes mellitus with diabetic neuropathy, without long-term current use of insulin (HCC)   Stage 3a chronic kidney disease (HCC)   Chronic anticoagulation   Post concussive syndrome   Severe sepsis (HCC)   Non-STEMI (non-ST elevated myocardial infarction) (HCC)   Hypotension  Severe sepsis likely due to pneumonia: POA.  Febrile to 103 with tachypnea.  She has dry hacking cough.  CXR raises concern for by basilar opacities.  She has significantly elevated troponin concerning for non-STEMI.  She is also hypotensive.  COVID-19, influenza PCR full RVP panel negative.  No lactic acidosis.  Cannot rule out bacteremia.  -Broaden antibiotics to cefepime and vancomycin.  Discontinue ceftriaxone. -CT angio chest to rule out PE.  This could also give [place] more information about pneumonia. -Follow-up blood cultures -Check procalcitonin  Non-STEMI: Troponin trended from 10>>> 2438>> 1900.  Patient without chest pain.  No acute ischemic finding on EKG. -Echocardiogram and BNP -Cardiology consulted -Hold off IV heparin since she is already on Xarelto.  Hypotension: Due to sepsis?  Also concern for non-STEMI. -Hold home antihypertensive meds -IV NS bolus  500 cc x 1 -Echocardiogram as above.  Flulike illness-due to flu vaccine?  She had generalized weakness, confusion, fever, myalgia after flu vaccine.  COVID-19 and influenza PCR nonreactive.  Full RVP panel negative. -Supportive care  Paroxysmal A-fib on Eliquis: In sinus rhythm. -Continue home Xarelto and Tikosyn. 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  She is on torsemide and Aldactone at home. -Check BNP and echocardiogram -Holding diuretics in the setting of hypotension  Postconcussive syndrome -Supportive care  Generalized weakness -PT/OT eval  Morbid obesity: Elevated BMI with comorbidity as above. Body mass index is 36.47 kg/m.           DVT prophylaxis:  rivaroxaban (XARELTO) tablet 20 mg Start: 03/18/22 1700 rivaroxaban (XARELTO) tablet 20 mg  Code Status: Full code Family Communication: None at bedside Level of care: Telemetry Medical Status is: Inpatient Remains inpatient appropriate because: Due to severe sepsis, possible non-STEMI and hypotension   Final disposition: Remains inpatient Consultants:  Cardiology  Sch Meds:  Scheduled Meds:  amitriptyline  25 mg Oral QHS   azelastine  2 spray Each Nare BID   Bempedoic Acid  1 tablet Oral Daily   cholecalciferol  2,000 Units Oral QHS   dofetilide  250 mcg Oral BID   febuxostat  40 mg Oral Daily   magnesium oxide  400 mg Oral QHS   revefenacin  175 mcg Nebulization Daily   rivaroxaban  20 mg Oral Q supper   Continuous Infusions:  cefTRIAXone (ROCEPHIN)  IV Stopped (03/18/22 0430)   PRN Meds:.acetaminophen **OR** acetaminophen, albuterol, guaiFENesin-dextromethorphan  Antimicrobials: Anti-infectives (From admission, onward)    Start     Dose/Rate Route Frequency Ordered Stop   03/19/22 0900  vancomycin (VANCOREADY) IVPB 1250 mg/250 mL  Status:  Discontinued        1,250 mg 166.7 mL/hr over 90 Minutes Intravenous Every 36 hours 03/17/22 2311 03/18/22 0027   03/18/22 0730  ceFEPIme (MAXIPIME) 2 g in sodium chloride 0.9 % 100 mL IVPB  Status:  Discontinued        2 g 200 mL/hr over 30 Minutes Intravenous Every 12 hours 03/17/22 2311 03/18/22 0027   03/18/22 0000  cefTRIAXone (ROCEPHIN) 2 g in sodium chloride 0.9 % 100 mL IVPB        2 g 200 mL/hr over 30  Minutes Intravenous Every 24 hours 03/17/22 2356 03/22/22 2359   03/17/22 1915  vancomycin (VANCOREADY) IVPB 2000 mg/400 mL        2,000 mg 200 mL/hr over 120 Minutes Intravenous  Once 03/17/22 1901 03/18/22 0109   03/17/22 1915  ceFEPIme (MAXIPIME) 2 g in sodium chloride 0.9 % 100 mL IVPB        2 g 200 mL/hr over 30 Minutes Intravenous  Once 03/17/22 1901 03/17/22 2034        I have personally reviewed the following labs and images: CBC: Recent Labs  Lab 03/17/22 1847  WBC 9.8  NEUTROABS 9.0*  HGB 11.8*  HCT 36.8  MCV 86.2  PLT 195   BMP &GFR Recent Labs  Lab 03/17/22 1847 03/18/22 0549  NA 134*  --   K 4.1  --   CL 102  --   CO2 21*  --   GLUCOSE 147*  --   BUN 8  --   CREATININE 0.83 0.85  CALCIUM 9.0  --  MG  --  1.9   Estimated Creatinine Clearance: 57.5 mL/min (by C-G formula based on SCr of 0.85 mg/dL). Liver & Pancreas: Recent Labs  Lab 03/17/22 1847  AST 15  ALT 11  ALKPHOS 31*  BILITOT 0.8  PROT 6.3*  ALBUMIN 3.4*   No results for input(s): "LIPASE", "AMYLASE" in the last 168 hours. No results for input(s): "AMMONIA" in the last 168 hours. Diabetic: No results for input(s): "HGBA1C" in the last 72 hours. Recent Labs  Lab 03/18/22 0832  GLUCAP 137*   Cardiac Enzymes: No results for input(s): "CKTOTAL", "CKMB", "CKMBINDEX", "TROPONINI" in the last 168 hours. No results for input(s): "PROBNP" in the last 8760 hours. Coagulation Profile: No results for input(s): "INR", "PROTIME" in the last 168 hours. Thyroid Function Tests: No results for input(s): "TSH", "T4TOTAL", "FREET4", "T3FREE", "THYROIDAB" in the last 72 hours. Lipid Profile: No results for input(s): "CHOL", "HDL", "LDLCALC", "TRIG", "CHOLHDL", "LDLDIRECT" in the last 72 hours. Anemia Panel: No results for input(s): "VITAMINB12", "FOLATE", "FERRITIN", "TIBC", "IRON", "RETICCTPCT" in the last 72 hours. Urine analysis:    Component Value Date/Time   COLORURINE YELLOW 03/17/2022  2244   APPEARANCEUR HAZY (A) 03/17/2022 2244   APPEARANCEUR Hazy 12/25/2012 1653   LABSPEC 1.012 03/17/2022 2244   LABSPEC 1.021 12/25/2012 1653   PHURINE 7.0 03/17/2022 2244   GLUCOSEU NEGATIVE 03/17/2022 2244   GLUCOSEU NEGATIVE 08/07/2021 1219   HGBUR NEGATIVE 03/17/2022 2244   BILIRUBINUR NEGATIVE 03/17/2022 2244   BILIRUBINUR Negative 12/25/2012 [place] 03/17/2022 2244   PROTEINUR NEGATIVE 03/17/2022 2244   UROBILINOGEN 1.0 08/07/2021 1219   NITRITE NEGATIVE 03/17/2022 2244   LEUKOCYTESUR LARGE (A) 03/17/2022 2244   LEUKOCYTESUR Negative 12/25/2012 1653   Sepsis Labs: Invalid input(s): "PROCALCITONIN", "LACTICIDVEN"  Microbiology: Recent Results (from the past 240 hour(s))  Resp Panel by RT-PCR (Flu A&B, Covid)     Status: None   Collection Time: 03/17/22  6:47 PM   Specimen: Nasal Swab  Result Value Ref Range Status   SARS Coronavirus 2 by RT PCR NEGATIVE NEGATIVE Final    Comment: (NOTE) SARS-CoV-2 target nucleic acids are NOT DETECTED.  The SARS-CoV-2 RNA is generally detectable in upper respiratory specimens during the acute phase of infection. The lowest concentration of SARS-CoV-2 viral copies this assay can detect is 138 copies/mL. A negative result does not preclude SARS-Cov-2 infection and should not be used as the sole basis for treatment or other patient management decisions. A negative result may occur with  improper specimen collection/handling, submission of specimen other than nasopharyngeal swab, presence of viral mutation(s) within the areas targeted by this assay, and inadequate number of viral copies(<138 copies/mL). A negative result must be combined with clinical observations, patient history, and epidemiological information. The expected result is Negative.  Fact Sheet for Patients:  EntrepreneurPulse.com.au  Fact Sheet for Healthcare Providers:  IncredibleEmployment.be  This test is no t  yet approved or cleared by the [place] FDA and  has been authorized for detection and/or diagnosis of SARS-CoV-2 by FDA under an Emergency Use Authorization (EUA). This EUA will remain  in effect (meaning this test can be used) for the duration of the COVID-19 declaration under Section 564(b)(1) of the Act, 21 U.S.C.section 360bbb-3(b)(1), unless the authorization is terminated  or revoked sooner.       Influenza A by PCR NEGATIVE NEGATIVE Final   Influenza B by PCR NEGATIVE NEGATIVE Final    Comment: (NOTE) The Xpert Xpress SARS-CoV-2/FLU/RSV plus assay is intended  as an aid in the diagnosis of influenza from Nasopharyngeal swab specimens and should not be used as a sole basis for treatment. Nasal washings and aspirates are unacceptable for Xpert Xpress SARS-CoV-2/FLU/RSV testing.  Fact Sheet for Patients: EntrepreneurPulse.com.au  Fact Sheet for Healthcare Providers: IncredibleEmployment.be  This test is not yet approved or cleared by the [place] FDA and has been authorized for detection and/or diagnosis of SARS-CoV-2 by FDA under an Emergency Use Authorization (EUA). This EUA will remain in effect (meaning this test can be used) for the duration of the COVID-19 declaration under Section 564(b)(1) of the Act, 21 U.S.C. section 360bbb-3(b)(1), unless the authorization is terminated or revoked.  Performed at [facility], [place] [address], [place], [place] [address]   Respiratory (~20 pathogens) panel by PCR     Status: None   Collection Time: 03/17/22  9:23 PM   Specimen: Respiratory  Result Value Ref Range Status   Adenovirus NOT DETECTED NOT DETECTED Final   Coronavirus 229E NOT DETECTED NOT DETECTED Final    Comment: (NOTE) The Coronavirus on the Respiratory Panel, DOES NOT test for the novel  Coronavirus (2019 nCoV)    Coronavirus HKU1 NOT DETECTED NOT DETECTED Final   Coronavirus NL63 NOT DETECTED NOT DETECTED  Final   Coronavirus OC43 NOT DETECTED NOT DETECTED Final   Metapneumovirus NOT DETECTED NOT DETECTED Final   Rhinovirus / Enterovirus NOT DETECTED NOT DETECTED Final   Influenza A NOT DETECTED NOT DETECTED Final   Influenza B NOT DETECTED NOT DETECTED Final   Parainfluenza Virus 1 NOT DETECTED NOT DETECTED Final   Parainfluenza Virus 2 NOT DETECTED NOT DETECTED Final   Parainfluenza Virus 3 NOT DETECTED NOT DETECTED Final   Parainfluenza Virus 4 NOT DETECTED NOT DETECTED Final   Respiratory Syncytial Virus NOT DETECTED NOT DETECTED Final   Bordetella pertussis NOT DETECTED NOT DETECTED Final   Bordetella Parapertussis NOT DETECTED NOT DETECTED Final   Chlamydophila pneumoniae NOT DETECTED NOT DETECTED Final   Mycoplasma pneumoniae NOT DETECTED NOT DETECTED Final    Comment: Performed at [facility], [place]. [address] [place], [place] [address]    Radiology Studies: CT Head Wo Contrast  Result Date: 03/17/2022 CLINICAL DATA:  Mental status change, unknown cause. Fever and weakness. EXAM: CT HEAD WITHOUT CONTRAST TECHNIQUE: Contiguous axial images were obtained from the base of the skull through the vertex without intravenous contrast. RADIATION DOSE REDUCTION: This exam was performed according to the departmental dose-optimization program which includes automated exposure control, adjustment of the mA and/or kV according to patient size and/or use of iterative reconstruction technique. COMPARISON:  10/05/2021. FINDINGS: Brain: No acute intracranial hemorrhage, midline shift or mass effect. No extra-axial fluid collection. Diffuse atrophy is noted. Gray-white matter differentiation is within normal limits. No hydrocephalus. Vascular: No hyperdense vessel or unexpected calcification. Skull: No acute fracture. Sinuses/Orbits: There is partial opacification of the sphenoid sinus on the left and ethmoid air cells on the right. No acute orbital abnormality. Other: None. IMPRESSION: 1. No acute  intracranial process. 2. Atrophy. Electronically Signed   By: Brett Fairy M.D.   On: 03/17/2022 20:59   DG Chest Port 1 View  Result Date: 03/17/2022 CLINICAL DATA:  Fever. Weakness. EXAM: PORTABLE CHEST 1 VIEW COMPARISON:  11/19/2021, CT 09/03/2021 FINDINGS: Multi lead left-sided pacemaker in place. Stable cardiomegaly. Unchanged mediastinal contours with aortic atherosclerosis. Chronic hyperinflation. Increased bronchial thickening from prior. Left lung base assessment is suboptimal due to overlapping soft tissue structures. No large  pleural effusion. No pneumothorax. Chronic right shoulder arthropathy. IMPRESSION: 1. Increased bronchial thickening from prior, may be infectious or inflammatory. 2. Chronic hyperinflation.  Unchanged cardiomegaly. Electronically Signed   By: Keith Rake M.D.   On: 03/17/2022 19:08      Keesha Pellum T. Tazewell  If 7PM-7AM, please contact night-coverage [email or web address] 03/18/2022, 12:24 PM

## 2022-03-19 ENCOUNTER — Inpatient Hospital Stay (HOSPITAL_COMMUNITY): Payer: Medicare Other

## 2022-03-19 DIAGNOSIS — A419 Sepsis, unspecified organism: Secondary | ICD-10-CM | POA: Diagnosis not present

## 2022-03-19 DIAGNOSIS — N1831 Chronic kidney disease, stage 3a: Secondary | ICD-10-CM | POA: Diagnosis not present

## 2022-03-19 DIAGNOSIS — E1140 Type 2 diabetes mellitus with diabetic neuropathy, unspecified: Secondary | ICD-10-CM | POA: Diagnosis not present

## 2022-03-19 DIAGNOSIS — G4730 Sleep apnea, unspecified: Secondary | ICD-10-CM | POA: Diagnosis not present

## 2022-03-19 DIAGNOSIS — I214 Non-ST elevation (NSTEMI) myocardial infarction: Secondary | ICD-10-CM

## 2022-03-19 DIAGNOSIS — I5021 Acute systolic (congestive) heart failure: Secondary | ICD-10-CM

## 2022-03-19 DIAGNOSIS — I5043 Acute on chronic combined systolic (congestive) and diastolic (congestive) heart failure: Secondary | ICD-10-CM | POA: Insufficient documentation

## 2022-03-19 LAB — RENAL FUNCTION PANEL
Albumin: 2.7 g/dL — ABNORMAL LOW (ref 3.5–5.0)
Anion gap: 8 (ref 5–15)
BUN: 9 mg/dL (ref 8–23)
CO2: 22 mmol/L (ref 22–32)
Calcium: 8.4 mg/dL — ABNORMAL LOW (ref 8.9–10.3)
Chloride: 102 mmol/L (ref 98–111)
Creatinine, Ser: 0.75 mg/dL (ref 0.44–1.00)
GFR, Estimated: >60 mL/min (ref >60)
Glucose, Bld: 163 mg/dL — ABNORMAL HIGH (ref 70–99)
Phosphorus: 1.2 mg/dL — ABNORMAL LOW (ref 2.5–4.6)
Potassium: 3.2 mmol/L — ABNORMAL LOW (ref 3.5–5.1)
Sodium: 132 mmol/L — ABNORMAL LOW (ref 135–145)

## 2022-03-19 LAB — CBC
HCT: 29.8 % — ABNORMAL LOW (ref 36.0–46.0)
Hemoglobin: 10.1 g/dL — ABNORMAL LOW (ref 12.0–15.0)
MCH: 27.8 pg (ref 26.0–34.0)
MCHC: 33.9 g/dL (ref 30.0–36.0)
MCV: 82.1 fL (ref 80.0–100.0)
Platelets: 164 10*3/uL (ref 150–400)
RBC: 3.63 MIL/uL — ABNORMAL LOW (ref 3.87–5.11)
RDW: 14.3 % (ref 11.5–15.5)
WBC: 7.3 10*3/uL (ref 4.0–10.5)
nRBC: 0 % (ref 0.0–0.2)

## 2022-03-19 LAB — GLUCOSE, CAPILLARY: Glucose-Capillary: 143 mg/dL — ABNORMAL HIGH (ref 70–99)

## 2022-03-19 LAB — ECHOCARDIOGRAM COMPLETE
Area-P 1/2: 3.99 cm2
Calc EF: 34.5 %
Height: 61 in
S' Lateral: 4.1 cm
Single Plane A2C EF: 37.4 %
Single Plane A4C EF: 32 %
Weight: 3097.02 oz

## 2022-03-19 LAB — MRSA NEXT GEN BY PCR, NASAL: MRSA by PCR Next Gen: NOT DETECTED

## 2022-03-19 LAB — PROCALCITONIN: Procalcitonin: 12.37 ng/mL

## 2022-03-19 LAB — MAGNESIUM: Magnesium: 2 mg/dL (ref 1.7–2.4)

## 2022-03-19 MED ORDER — SODIUM CHLORIDE 0.9 % WEIGHT BASED INFUSION: 1.0000 mL/kg/h | INTRAVENOUS | Status: DC

## 2022-03-19 MED ORDER — DOXYCYCLINE HYCLATE 100 MG PO TABS
100.0000 mg | ORAL_TABLET | Freq: Two times a day (BID) | ORAL | Status: DC
  Administered 2022-03-19 – 2022-03-22 (×7): 100 mg via ORAL
  Filled 2022-03-19 (×7): qty 1

## 2022-03-19 MED ORDER — SODIUM CHLORIDE 0.9 % IV SOLN: 2.0000 g | Freq: Three times a day (TID) | INTRAVENOUS | Status: DC

## 2022-03-19 MED ORDER — HEPARIN (PORCINE) 25000 UT/250ML-% IV SOLN
1500.0000 [IU]/h | INTRAVENOUS | Status: DC
  Administered 2022-03-19: 1250 [IU]/h via INTRAVENOUS
  Filled 2022-03-19: qty 250

## 2022-03-19 MED ORDER — SODIUM CHLORIDE 0.9 % IV SOLN: 500.0000 mg | INTRAVENOUS | Status: DC

## 2022-03-19 MED ORDER — ASPIRIN 81 MG PO CHEW
81.0000 mg | CHEWABLE_TABLET | ORAL | Status: AC
  Administered 2022-03-20: 81 mg via ORAL
  Filled 2022-03-19: qty 1

## 2022-03-19 MED ORDER — SODIUM CHLORIDE 0.9 % IV SOLN: 250.0000 mL | INTRAVENOUS | Status: DC | PRN

## 2022-03-19 MED ORDER — SODIUM CHLORIDE 0.9% FLUSH
3.0000 mL | Freq: Two times a day (BID) | INTRAVENOUS | Status: DC
  Administered 2022-03-19 – 2022-03-22 (×6): 3 mL via INTRAVENOUS

## 2022-03-19 MED ORDER — POTASSIUM PHOSPHATES 15 MMOLE/5ML IV SOLN
30.0000 mmol | Freq: Once | INTRAVENOUS | Status: AC
  Administered 2022-03-19: 30 mmol via INTRAVENOUS
  Filled 2022-03-19: qty 10

## 2022-03-19 MED ORDER — POTASSIUM CHLORIDE CRYS ER 20 MEQ PO TBCR
40.0000 meq | EXTENDED_RELEASE_TABLET | Freq: Once | ORAL | Status: AC
  Administered 2022-03-19: 40 meq via ORAL
  Filled 2022-03-19: qty 2

## 2022-03-19 MED ORDER — SODIUM CHLORIDE 0.9 % WEIGHT BASED INFUSION: 3.0000 mL/kg/h | INTRAVENOUS | Status: DC

## 2022-03-19 MED ORDER — PERFLUTREN LIPID MICROSPHERE
1.0000 mL | INTRAVENOUS | Status: AC | PRN
  Administered 2022-03-19: 5 mL via INTRAVENOUS

## 2022-03-19 MED ORDER — POTASSIUM CHLORIDE CRYS ER 20 MEQ PO TBCR
40.0000 meq | EXTENDED_RELEASE_TABLET | ORAL | Status: AC
  Administered 2022-03-19: 40 meq via ORAL
  Filled 2022-03-19: qty 2

## 2022-03-19 MED ORDER — SODIUM CHLORIDE 0.9 % IV SOLN
1.0000 g | INTRAVENOUS | Status: DC
  Administered 2022-03-19 – 2022-03-21 (×3): 1 g via INTRAVENOUS
  Filled 2022-03-19 (×3): qty 10

## 2022-03-19 MED ORDER — SODIUM CHLORIDE 0.9% FLUSH: 3.0000 mL | INTRAVENOUS | Status: DC | PRN

## 2022-03-19 NOTE — Progress Notes (Signed)
Pharmacy Antibiotic Note  Kathleen [name] is a 76 y.o. female admitted on 03/17/2022 with sepsis. Pharmacy has been consulted for cefepime and vancomycin dosing. Doxycycline added as well for atypical coverage.  Plan: Increase cefepime to 2g IV q8h Continue vancomycin 1250mg  IV q36h Doxycycline added by MD   Height: 5\' 1"  (154.9 cm) Weight:  (does not want to stand up now/too tired/instructed to call to stand on the scale when she has to use the restroom next/will pass on to day shift/RN notified) IBW/kg (Calculated) : 47.8  Temp (24hrs), Avg:98.6 F (37 C), Min:97.7 F (36.5 C), Max:99.6 F (37.6 C)  Recent Labs  Lab 03/17/22 1847 03/18/22 0549 03/19/22 0026 03/19/22 0027  WBC 9.8  --   --  7.3  CREATININE 0.83 0.85 0.75  --   LATICACIDVEN 0.9  --   --   --      Estimated Creatinine Clearance: 61.2 mL/min (by C-G formula based on SCr of 0.75 mg/dL).    Allergies  Allergen Reactions   Allopurinol Other (See Comments)    Reaction:  Dizziness    Clindamycin Anaphylaxis and Hives   Pneumococcal 13-Val Conj Vacc Itching, Swelling and Rash   Dronedarone Rash   Brovana [Arformoterol]     Caused muscle pain   Budesonide     Caused extreme joint pain   Entresto [Sacubitril-Valsartan] Other (See Comments)    hypotension   Fosamax [Alendronate Sodium] Nausea Only   Jardiance [Empagliflozin]     Caused a vaginal infection   Meperidine Nausea And Vomiting   Microplegia Msa-Msg [Plegisol]     Loopy,diarrhea   Rosuvastatin Other (See Comments)    Reaction:  Muscle spasms    Tetracycline Hives   Adhesive [Tape] Rash   Lovastatin Rash    Other reaction(s): Muscle Pain    Antimicrobials this admission: Cefepime 10/10 >> Vancomycin 10/10 >> Doxycycline 10/12 >>  Microbiology results: 10/10 BCx: NGTD  Thank you for allowing pharmacy to be a part of this patient's care.  Arrie Senate, PharmD, BCPS, [facility] Clinical Pharmacist [phone] Please check AMION for all [place] numbers 03/19/2022

## 2022-03-19 NOTE — Evaluation (Signed)
Physical Therapy Evaluation Patient Details Name: Kathleen [name] MRN: [id number] DOB: May 17, 1946 Today's Date: 03/19/2022  History of Present Illness  Pt is 76 yo female who presented to ED 10/10 via EMS with fever, confusion for 1 day. Work up revealed sepsis present on admission. Hospital course complicated by elevated troponin, suspect elevation due to demand ischemia in setting of illness. Plan for heart cath on 10/13. 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   Clinical Impression  Pt in bed upon arrival of PT, agreeable to evaluation at this time. Prior to admission the pt was ambulating with use of rollator or SPC for mobility, reports no falls since April 2023. The pt now presents with limitations in functional mobility, strength, power, endurance, and dynamic stability due to above dx, and will continue to benefit from skilled PT to address these deficits. The pt reports she typically has no limitations in endurance but does not participate in any consistent activity or exercise at baseline. The pt was limited to ~15 ft ambulation with use of RW in her room on 4L O2 at this time due to onset of fatigue and reports of SOB with activity (SpO2 remained >98% on 4L). Mobilizing on minG level at this time but will benefit from continued mobility and skilled PT to progress functional endurance and LE strength/power to improve capacity for transfers. Anticipate the pt will be able to return to ILF but would benefit from HHPT in that setting to maximize functional independence and endurance.      Recommendations for follow up therapy are one component of a multi-disciplinary discharge planning process, led by the attending physician.  Recommendations may be updated based on patient status, additional functional criteria and insurance  authorization.  Follow Up Recommendations Home health PT      Assistance Recommended at Discharge Intermittent Supervision/Assistance  Patient can return home with the following  A little help with walking and/or transfers;A little help with bathing/dressing/bathroom;Assistance with cooking/housework;Direct supervision/assist for medications management;Direct supervision/assist for financial management;Assist for transportation;Help with stairs or ramp for entrance    Equipment Recommendations None recommended by PT  Recommendations for Other Services       Functional Status Assessment Patient has had a recent decline in their functional status and demonstrates the ability to make significant improvements in function in a reasonable and predictable amount of time.     Precautions / Restrictions Precautions Precautions: Fall Precaution Comments: watch O2, on 4L Restrictions Weight Bearing Restrictions: No      Mobility  Bed Mobility Overal bed mobility: Needs Assistance Bed Mobility: Sit to Supine       Sit to supine: Min guard, HOB elevated   General bed mobility comments: min guard for safety. extra time/effort. tends to use whole body movements. struggled a bit to advance BLE onto bed but no physical assist needed.    Transfers Overall transfer level: Needs assistance Equipment used: Rolling walker (2 wheels) Transfers: Sit to/from Stand Sit to Stand: Min guard           General transfer comment: minG for safety, no overt LOB, benefits from use of BUE due to poor LE strength    Ambulation/Gait Ambulation/Gait assistance: Min guard Gait Distance (Feet): 15 Feet Assistive device: Rolling walker (2 wheels) Gait Pattern/deviations: Step-through pattern, Decreased stride length Gait velocity: decreased Gait velocity interpretation: <1.31 ft/sec, indicative of household ambulator  General Gait Details: pt with small steps and minimal stride, antalgic due to R  hip pain. no overt LOB and SpO2 stable on 4L    Balance Overall balance assessment: Needs assistance Sitting-balance support: No upper extremity supported, Feet supported Sitting balance-Leahy Scale: Fair Sitting balance - Comments: no UE support   Standing balance support: Bilateral upper extremity supported, During functional activity Standing balance-Leahy Scale: Fair Standing balance comment: can static stand without UE support, BUE support for gait                             Pertinent Vitals/Pain Pain Assessment Pain Assessment: Faces Faces Pain Scale: Hurts little more Pain Location: R hip (chronic but inflamed from immobility) Pain Descriptors / Indicators: Discomfort Pain Intervention(s): Limited activity within patient's tolerance, Monitored during session, Repositioned (offered heat and ice, pt declined)    Home Living Family/patient expects to be discharged to:: Private residence Living Arrangements: Alone Available Help at Discharge: Family;Available PRN/intermittently Type of Home: Independent living facility Home Access: Elevator       Home Layout: One level Home Equipment: Rollator (4 wheels);Cane - single point;Shower seat Additional Comments: [facility].    Prior Function Prior Level of Function : Independent/Modified Independent             Mobility Comments: occasional cane at store until able to get a basket, uses rollator in home and around facility ADLs Comments: Independent     Hand Dominance   Dominant Hand: Right    Extremity/Trunk Assessment   Upper Extremity Assessment Upper Extremity Assessment: Defer to OT evaluation;Generalized weakness RUE Deficits / Details: limited AROM shoulder flexion d/t h/o rotator cuff    Lower Extremity Assessment Lower Extremity Assessment: Generalized weakness (grossly 3+/5 to MMT, denies difference in sensation. chronic R hip pain)    Cervical / Trunk Assessment Cervical / Trunk  Assessment: Normal  Communication   Communication: No difficulties  Cognition Arousal/Alertness: Awake/alert Behavior During Therapy: WFL for tasks assessed/performed Overall Cognitive Status: Within Functional Limits for tasks assessed                                          General Comments General comments (skin integrity, edema, etc.): VSS on 4L with all mobility. BP stable despite pt reports of dizziness        Assessment/Plan    PT Assessment Patient needs continued PT services  PT Problem List Decreased strength;Decreased range of motion;Decreased activity tolerance;Decreased balance;Decreased mobility;Cardiopulmonary status limiting activity       PT Treatment Interventions Gait training;DME instruction;Stair training;Functional mobility training;Therapeutic activities;Balance training;Therapeutic exercise;Patient/family education    PT Goals (Current goals can be found in the Care Plan section)  Acute Rehab PT Goals Patient Stated Goal: return home and to independence PT Goal Formulation: With patient Time For Goal Achievement: 04/02/22 Potential to Achieve Goals: Good    Frequency Min 3X/week        AM-PAC PT "6 Clicks" Mobility  Outcome Measure Help needed turning from your back to your side while in a flat bed without using bedrails?: A Little Help needed moving from lying on your back to sitting on the side of a flat bed without using bedrails?: A Little Help needed moving to and from a bed to a chair (including a wheelchair)?: A Little Help needed standing up from a  chair using your arms (e.g., wheelchair or bedside chair)?: A Little Help needed to walk in hospital room?: A Lot Help needed climbing 3-5 steps with a railing? : Total 6 Click Score: 15    End of Session Equipment Utilized During Treatment: Gait belt;Oxygen Activity Tolerance: Patient tolerated treatment well;Patient limited by fatigue Patient left: in bed;with call  bell/phone within reach;with bed alarm set Nurse Communication: Mobility status PT Visit Diagnosis: Unsteadiness on feet (R26.81);Other abnormalities of gait and mobility (R26.89);Muscle weakness (generalized) (M62.81)    Time: 3491-7915 PT Time Calculation (min) (ACUTE ONLY): 31 min   Charges:   PT Evaluation $PT Eval Low Complexity: 1 Low PT Treatments $Therapeutic Exercise: 8-22 mins        Vickki Muff, PT, DPT   Acute Rehabilitation Department  Ronnie Derby 03/19/2022, 4:54 PM

## 2022-03-19 NOTE — Plan of Care (Signed)
  Problem: Education: Goal: Knowledge of General Education information will improve Description: Including pain rating scale, medication(s)/side effects and non-pharmacologic comfort measures Outcome: Progressing   Problem: Health Behavior/Discharge Planning: Goal: Ability to manage health-related needs will improve Outcome: Progressing   Problem: Clinical Measurements: Goal: Ability to maintain clinical measurements within normal limits will improve Outcome: Progressing Goal: Will remain free from infection Outcome: Progressing Goal: Respiratory complications will improve Outcome: Progressing Goal: Cardiovascular complication will be avoided Outcome: Progressing   Problem: Activity: Goal: Risk for activity intolerance will decrease Outcome: Progressing   Problem: Nutrition: Goal: Adequate nutrition will be maintained Outcome: Progressing   Problem: Coping: Goal: Level of anxiety will decrease Outcome: Progressing   Problem: Elimination: Goal: Will not experience complications related to bowel motility Outcome: Progressing   Problem: Pain Managment: Goal: General experience of comfort will improve Outcome: Progressing   Problem: Safety: Goal: Ability to remain free from injury will improve Outcome: Progressing   Problem: Skin Integrity: Goal: Risk for impaired skin integrity will decrease Outcome: Progressing   

## 2022-03-19 NOTE — Progress Notes (Signed)
Echocardiogram 2D Echocardiogram has been performed.  Fidel Levy 03/19/2022, 10:51 AM

## 2022-03-19 NOTE — Progress Notes (Signed)
Nurse reached out to provider Cyndia Skeeters, MD to see if patient needed baseline labs before Heparin gtt is started. Will continue to monitor.

## 2022-03-19 NOTE — Progress Notes (Signed)
Rounding Note    Patient Name: Kathleen [name] Date of Encounter: 03/19/2022  [place] Cardiologist: None   Subjective   Still slightly better this morning. Echo at the bedside.   Inpatient Medications    Scheduled Meds:  amitriptyline  25 mg Oral QHS   azelastine  2 spray Each Nare BID   cholecalciferol  2,000 Units Oral QHS   dofetilide  250 mcg Oral BID   doxycycline  100 mg Oral Q12H   febuxostat  40 mg Oral Daily   magnesium oxide  400 mg Oral QHS   revefenacin  175 mcg Nebulization Daily   rivaroxaban  20 mg Oral Q supper   Continuous Infusions:  ceFEPime (MAXIPIME) IV Stopped (03/18/22 2234)   potassium PHOSPHATE IVPB (in mmol) 30 mmol (03/19/22 0820)   vancomycin     PRN Meds: acetaminophen **OR** acetaminophen, albuterol, guaiFENesin-dextromethorphan   Vital Signs    Vitals:   03/19/22 0344 03/19/22 0718 03/19/22 0818 03/19/22 0849  BP: 108/62 (!) 98/58 (!) 106/46   Pulse:  75    Resp:  (!) 22 17   Temp: 97.7 F (36.5 C) 97.7 F (36.5 C)    TempSrc: Oral Oral    SpO2:  99%  100%  Weight:      Height:        Intake/Output Summary (Last 24 hours) at 03/19/2022 0901 Last data filed at 03/19/2022 0350 Gross per 24 hour  Intake 319.27 ml  Output --  Net 319.27 ml      03/18/2022    8:42 PM 03/17/2022    6:43 PM 03/12/2022    3:46 PM  Last 3 Weights  Weight (lbs) 193 lb 9 oz 193 lb 194 lb  Weight (kg) 87.8 kg 87.544 kg 87.998 kg      Telemetry    Sinus Rhythm - Personally Reviewed  ECG    No new tracing  Physical Exam   GEN: Obese older female, on 4L [place] chronically Neck: No JVD Cardiac: RRR, no murmurs, rubs, or gallops.  Respiratory: Clear to auscultation bilaterally. GI: Soft, nontender, non-distended  MS: No edema; No deformity. Neuro:  Nonfocal  Psych: Normal affect   Labs    High Sensitivity Troponin:   Recent Labs  Lab 03/17/22 1847 03/18/22 0549 03/18/22 0846 03/18/22 1146  TROPONINIHS 10 2,438*  1,869* 1,658*     Chemistry Recent Labs  Lab 03/17/22 1847 03/18/22 0549 03/19/22 0026 03/19/22 0027  NA 134*  --  132*  --   K 4.1  --  3.2*  --   CL 102  --  102  --   CO2 21*  --  22  --   GLUCOSE 147*  --  163*  --   BUN 8  --  9  --   CREATININE 0.83 0.85 0.75  --   CALCIUM 9.0  --  8.4*  --   MG  --  1.9  --  2.0  PROT 6.3*  --   --   --   ALBUMIN 3.4*  --  2.7*  --   AST 15  --   --   --   ALT 11  --   --   --   ALKPHOS 31*  --   --   --   BILITOT 0.8  --   --   --   GFRNONAA >60 >60 >60  --   ANIONGAP 11  --  8  --  Lipids No results for input(s): "CHOL", "TRIG", "HDL", "LABVLDL", "LDLCALC", "CHOLHDL" in the last 168 hours.  Hematology Recent Labs  Lab 03/17/22 1847 03/19/22 0027  WBC 9.8 7.3  RBC 4.27 3.63*  HGB 11.8* 10.1*  HCT 36.8 29.8*  MCV 86.2 82.1  MCH 27.6 27.8  MCHC 32.1 33.9  RDW 14.1 14.3  PLT 195 164   Thyroid No results for input(s): "TSH", "FREET4" in the last 168 hours.  BNP Recent Labs  Lab 03/17/22 1841 03/18/22 0541  BNP 195.9* 438.3*    DDimer No results for input(s): "DDIMER" in the last 168 hours.   Radiology    CT Angio Chest Pulmonary Embolism (PE) W or WO Contrast  Result Date: 03/18/2022 CLINICAL DATA:  High clinical suspicion for PE EXAM: CT ANGIOGRAPHY CHEST WITH CONTRAST TECHNIQUE: Multidetector CT imaging of the chest was performed using the standard protocol during bolus administration of intravenous contrast. Multiplanar CT image reconstructions and MIPs were obtained to evaluate the vascular anatomy. RADIATION DOSE REDUCTION: This exam was performed according to the departmental dose-optimization program which includes automated exposure control, adjustment of the mA and/or kV according to patient size and/or use of iterative reconstruction technique. CONTRAST:  13mL OMNIPAQUE IOHEXOL 350 MG/ML SOLN COMPARISON:  Previous CT done on 09/03/2021, chest radiograph done on 03/17/2022 FINDINGS: Cardiovascular: There are  scattered coronary artery calcifications. Pacemaker battery is seen in the left infraclavicular region. Contrast density in the lumen of thoracic aorta is less than adequate to evaluate for dissection. There is ectasia of main pulmonary artery measuring 3.8 cm suggesting pulmonary arterial hypertension. There are no intraluminal filling defects in central pulmonary artery branches. Motion artifacts limit evaluation of small peripheral branches. Mediastinum/Nodes: No significant lymphadenopathy seen. Lungs/Pleura: Centrilobular emphysema is seen. Small linear densities are seen in lower lung fields suggesting scarring or subsegmental atelectasis. There is no focal pulmonary consolidation. There is no pleural effusion or pneumothorax. There is narrowing of AP diameter of trachea and mainstem bronchi suggesting possible bronchospasm. Upper Abdomen: No acute findings are seen. Musculoskeletal: Degenerative changes are noted in both shoulders. Degenerative changes are noted in lower thoracic spine. Review of the MIP images confirms the above findings. IMPRESSION: There is no evidence of pulmonary artery embolism. There is ectasia of main pulmonary artery suggesting pulmonary arterial hypertension. Coronary artery calcifications are seen. Centrilobular emphysema. Linear densities in the lower lung fields suggest scarring or subsegmental atelectasis. Electronically Signed   By: Ernie Avena M.D.   On: 03/18/2022 13:09   CT Head Wo Contrast  Result Date: 03/17/2022 CLINICAL DATA:  Mental status change, unknown cause. Fever and weakness. EXAM: CT HEAD WITHOUT CONTRAST TECHNIQUE: Contiguous axial images were obtained from the base of the skull through the vertex without intravenous contrast. RADIATION DOSE REDUCTION: This exam was performed according to the departmental dose-optimization program which includes automated exposure control, adjustment of the mA and/or kV according to patient size and/or use of  iterative reconstruction technique. COMPARISON:  10/05/2021. FINDINGS: Brain: No acute intracranial hemorrhage, midline shift or mass effect. No extra-axial fluid collection. Diffuse atrophy is noted. Gray-white matter differentiation is within normal limits. No hydrocephalus. Vascular: No hyperdense vessel or unexpected calcification. Skull: No acute fracture. Sinuses/Orbits: There is partial opacification of the sphenoid sinus on the left and ethmoid air cells on the right. No acute orbital abnormality. Other: None. IMPRESSION: 1. No acute intracranial process. 2. Atrophy. Electronically Signed   By: Thornell Sartorius M.D.   On: 03/17/2022 20:59   DG Chest [facility]  1 View  Result Date: 03/17/2022 CLINICAL DATA:  Fever. Weakness. EXAM: PORTABLE CHEST 1 VIEW COMPARISON:  11/19/2021, CT 09/03/2021 FINDINGS: Multi lead left-sided pacemaker in place. Stable cardiomegaly. Unchanged mediastinal contours with aortic atherosclerosis. Chronic hyperinflation. Increased bronchial thickening from prior. Left lung base assessment is suboptimal due to overlapping soft tissue structures. No large pleural effusion. No pneumothorax. Chronic right shoulder arthropathy. IMPRESSION: 1. Increased bronchial thickening from prior, may be infectious or inflammatory. 2. Chronic hyperinflation.  Unchanged cardiomegaly. Electronically Signed   By: Narda Rutherford M.D.   On: 03/17/2022 19:08    Cardiac Studies   Echo: pending  Patient Profile     76 y.o. 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 Alanda Slim.  Assessment & Plan    Elevated Troponin -- in the setting of acute illness, no anginal symptoms. hsTn peaked at 2438. EKG sinus with V-pacing. Unclear etiology of elevated troponin,  though suspect likely demand in the setting of illness, myocarditis? -- echo at the bedside   Sepsis Suspected PNA BOOP -- on chronic 4L [place], follows with pulmonary as an outpatient -- respiratory panel negative. BC pending. CT negative for PE suggestion of pulmonary arterial hypertension, emphysema with lower lung field suggesting scarring versus subsegmental atelectasis -- antibiotics per primary team   Persistent Afib S/p AVN ablation with PPM '17 -- maintaining sinus rhythm -- continue Tikosyn (QTc 0.42 this morning on telemetry) and xarelto     HFpEF -- last [facility] at [facility] with preserved LV function, does not appear volume overloaded on exam -- repeat echo pending   Hypotension -- likely from hypovolemia/sepsis -- home BP meds held   Hypokalemia -- K+ 3.2, suppl  For questions or updates, please contact  [facility] Please consult [email or web address] for contact info under        Signed, Laverda Page, NP  03/19/2022, 9:01 AM

## 2022-03-19 NOTE — Progress Notes (Signed)
[place] for heparin Indication: atrial fibrillation  Allergies  Allergen Reactions   Allopurinol Other (See Comments)    Reaction:  Dizziness    Clindamycin Anaphylaxis and Hives   Pneumococcal 13-Val Conj Vacc Itching, Swelling and Rash   Dronedarone Rash   Brovana [Arformoterol]     Caused muscle pain   Budesonide     Caused extreme joint pain   Entresto [Sacubitril-Valsartan] Other (See Comments)    hypotension   Fosamax [Alendronate Sodium] Nausea Only   Jardiance [Empagliflozin]     Caused a vaginal infection   Meperidine Nausea And Vomiting   Microplegia Msa-Msg [Plegisol]     Loopy,diarrhea   Rosuvastatin Other (See Comments)    Reaction:  Muscle spasms    Tetracycline Hives   Adhesive [Tape] Rash   Lovastatin Rash    Other reaction(s): Muscle Pain    Patient Measurements: Height: 5\' 1"  (154.9 cm) Weight:  (does not want to stand up now/too tired/instructed to call to stand on the scale when she has to use the restroom next/will pass on to day shift/RN notified) IBW/kg (Calculated) : 47.8 Heparin Dosing Weight: 87kg  Vital Signs: Temp: 97.7 F (36.5 C) (10/12 0718) Temp Source: Oral (10/12 0718) BP: 106/46 (10/12 0818) Pulse Rate: 75 (10/12 0718)  Labs: Recent Labs    03/17/22 1847 03/18/22 0549 03/18/22 0846 03/18/22 1146 03/19/22 0026 03/19/22 0027  HGB 11.8*  --   --   --   --  10.1*  HCT 36.8  --   --   --   --  29.8*  PLT 195  --   --   --   --  164  CREATININE 0.83 0.85  --   --  0.75  --   TROPONINIHS 10 2,438* 1,869* 1,658*  --   --     Estimated Creatinine Clearance: 61.2 mL/min (by C-G formula based on SCr of 0.75 mg/dL).   Medical History: 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[place])    CHF (congestive heart failure) ([place])    Chronic renal insufficiency    Complete heart block ([place]) s/p AV  nodal ablation    Concussion 10/04/2021   COPD (chronic obstructive pulmonary disease) (HCC)    Gout    Hypertension    Longstanding persistent atrial fibrillation (HCC)    Nonischemic cardiomyopathy ([place])    Obesity    Pacemaker    Spontaneous pneumothorax 2013     Assessment: 76 yoF on Xarelto PTA for hx AF admitted with elevated troponins and possible ACS. Pharmacy consulted to hold Xarelto and start IV heparin with need for [facility]. Xarelto last given 10/11 1730.  Goal of Therapy:  Heparin level 0.3-0.7 units/ml aPTT 66-102 seconds Monitor platelets by anticoagulation protocol: Yes   Plan:  Start heparin tonight at 1730 no bolus at 1250 units/h Check aPTT and heparin level 8h after starting  Arrie Senate, PharmD, BCPS, [facility] Clinical Pharmacist [phone] Please check AMION for all [place] numbers 03/19/2022

## 2022-03-19 NOTE — Evaluation (Signed)
Occupational Therapy Evaluation Patient Details Name: Kathleen [name] MRN: [id number] DOB: [birth year] Today's Date: 03/19/2022   History of Present Illness Pt is 76 yo female who presented to ED via EMS with fever, confusion for 1 day. 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   Clinical Impression   Pt admitted with the above diagnoses and presents with below problem list. Pt will benefit from continued acute OT to address the below listed deficits and maximize independence with basic ADLs prior to d/c to venue below. At baseline, pt is mod I with ADLs, rollator at baseline. She resides in an ILF, occasional out in the community to complete her own shopping. Pt received sitting EOB. Completed bed mobility back to supine at end of session. DOE up to 2/4 with lengthy talking and during bed mobility. On 4L O2 throughout session with SpO2 >90. Plan to further assess transfers/mobility next session. Per clinical judgement pt currently needs up to mod A with LB ADLs, and up to min A with UB ADLs.       Recommendations for follow up therapy are one component of a multi-disciplinary discharge planning process, led by the attending physician.  Recommendations may be updated based on patient status, additional functional criteria and insurance authorization.   Follow Up Recommendations  Home health OT (pending progress vs rehab at [facility])    Assistance Recommended at Discharge Intermittent Supervision/Assistance  Patient can return home with the following A little help with walking and/or transfers;A little help with bathing/dressing/bathroom;Assistance with cooking/housework;Assist for transportation    Functional Status Assessment  Patient has had a recent decline in their functional status and demonstrates the ability to make significant improvements in function in a reasonable and predictable amount of time.   Equipment Recommendations  None recommended by OT    Recommendations for Other Services PT consult     Precautions / Restrictions Precautions Precautions: Fall Restrictions Weight Bearing Restrictions: No      Mobility Bed Mobility Overal bed mobility: Needs Assistance Bed Mobility: Sit to Supine       Sit to supine: Min guard, HOB elevated   General bed mobility comments: min guard for safety. extra time/effort. tends to use whole body movements. struggled a bit to advance BLE onto bed but no physical assist needed.    Transfers                   General transfer comment: deferred d/t current cardiac status      Balance Overall balance assessment: Needs assistance Sitting-balance support: No upper extremity supported, Feet supported Sitting balance-Leahy Scale: Fair Sitting balance - Comments: able to drink from cup while sitting EOB                                   ADL either performed or assessed with clinical judgement   ADL Overall ADL's : Needs assistance/impaired Eating/Feeding: Modified independent;Set up   Grooming: Minimal assistance;Sitting   Upper Body Bathing: Sitting;Minimal assistance   Lower Body Bathing: Moderate assistance   Upper Body Dressing : Minimal assistance;Sitting   Lower Body Dressing: Moderate assistance;Sit to/from stand   Toilet Transfer: Moderate assistance             General ADL Comments: pt completed bed mobility only this session d/t cardiac status (discussed with RN prior). Pt reports  yesterday she walked to bathroom but became very SOB and needed to go up to 6L O2. Today seen on 4L (baseline) with SpO2 >90. DOE 2/4 with lengthy talking and EOB activity.     Vision         Perception     Praxis      Pertinent Vitals/Pain       Hand Dominance Right   Extremity/Trunk Assessment Upper Extremity Assessment Upper Extremity Assessment: Generalized weakness;RUE deficits/detail RUE  Deficits / Details: limited AROM shoulder flexion d/t h/o rotator cuff   Lower Extremity Assessment Lower Extremity Assessment: Defer to PT evaluation   Cervical / Trunk Assessment Cervical / Trunk Assessment: Normal   Communication Communication Communication: No difficulties   Cognition Arousal/Alertness: Awake/alert Behavior During Therapy: WFL for tasks assessed/performed Overall Cognitive Status: Within Functional Limits for tasks assessed                                       General Comments       Exercises     Shoulder Instructions      Home Living Family/patient expects to be discharged to:: Private residence Living Arrangements: Alone Available Help at Discharge: Family;Available PRN/intermittently Type of Home: Independent living facility Home Access: Elevator     Home Layout: One level     Bathroom Shower/Tub: Occupational psychologist: Handicapped height     Home Equipment: Brandon (4 wheels);Cane - single point;Shower seat   Additional Comments: [facility].      Prior Functioning/Environment Prior Level of Function : Independent/Modified Independent             Mobility Comments: occasional cane at store until able to get a basket ADLs Comments: Independent        OT Problem List: Decreased strength;Decreased activity tolerance;Impaired balance (sitting and/or standing);Decreased knowledge of use of DME or AE;Decreased knowledge of precautions;Cardiopulmonary status limiting activity;Pain      OT Treatment/Interventions: Self-care/ADL training;Energy conservation;DME and/or AE instruction;Therapeutic activities;Patient/family education;Balance training    OT Goals(Current goals can be found in the care plan section) Acute Rehab OT Goals Patient Stated Goal: back to ILF unit OT Goal Formulation: With patient Time For Goal Achievement: 04/02/22 Potential to Achieve Goals: Good ADL Goals Pt Will Perform Upper  Body Dressing: with modified independence;sitting Pt Will Perform Lower Body Dressing: with modified independence;sit to/from stand Pt Will Transfer to Toilet: with modified independence;ambulating;bedside commode Pt Will Perform Toileting - Clothing Manipulation and hygiene: with modified independence;sit to/from stand  OT Frequency: Min 2X/week    Co-evaluation              AM-PAC OT "6 Clicks" Daily Activity     Outcome Measure Help from another person eating meals?: None Help from another person taking care of personal grooming?: A Little Help from another person toileting, which includes using toliet, bedpan, or urinal?: A Little Help from another person bathing (including washing, rinsing, drying)?: A Little Help from another person to put on and taking off regular upper body clothing?: A Little Help from another person to put on and taking off regular lower body clothing?: A Little 6 Click Score: 19   End of Session Equipment Utilized During Treatment: Oxygen (4L) Nurse Communication: Other (comment);Precautions;Mobility status (spoke with RN prior to session)  Activity Tolerance: Other (comment) (DOE up to 2/4 with lengthy talking and activity EOB. Recovered in a timely  manner) Patient left: in bed;with call bell/phone within reach;Other (comment) (did not alter bed alarm)  OT Visit Diagnosis: Unsteadiness on feet (R26.81);Muscle weakness (generalized) (M62.81);Pain                Time: 5053-9767 OT Time Calculation (min): 28 min Charges:  OT General Charges $OT Visit: 1 Visit OT Evaluation $OT Eval Moderate Complexity: Owen, OT Acute Rehabilitation Services Office: [phone]   Hortencia Pilar 03/19/2022, 12:42 PM

## 2022-03-19 NOTE — Progress Notes (Addendum)
PROGRESS NOTE  Kathleen [name] [id number] DOB: 12-24-1945   PCP: [name], MD  Patient is from: ILF.  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  In ED, febrile to 103.  WBC 9.8.  CXR with bilateral lower lobe opacity but mostly chronic.  CT head without acute finding.  COVID-19 and influenza PCR nonreactive.  Cultures drawn.  She was started on broad-spectrum antibiotics, and admitted.  Patient's troponin trended from 03-2499 overnight.  She had no chest pain or acute ischemic finding on EKG.  Cardiology consulted. TTE with LVEF of 35 to 40% (official results pending).  CTA chest negative for PE or pneumonia.  Plan for left heart catheterization on 10/13.  Subjective: Seen and examined earlier this morning.  No major events overnight of this morning.  She says she feels about the same.  Still with dyspnea on exertion and dry cough.  She reports chest pain with cough but not with exertion.  Denies GI or UTI symptoms.  Objective: Vitals:   03/19/22 0718 03/19/22 0818 03/19/22 0849 03/19/22 1126  BP: (!) 98/58 (!) 106/46  (!) 98/52  Pulse: 75   75  Resp: (!) 22 17  17   Temp: 97.7 F (36.5 C)   98 F (36.7 C)  TempSrc: Oral   Oral  SpO2: 99%  100% 98%  Weight:    87.2 kg  Height:    5\' 1"  (1.549 m)    Examination:  GENERAL: No apparent distress.  Nontoxic. HEENT: MMM.  Vision and hearing grossly intact.  NECK: Supple.  No apparent JVD.  RESP:  No IWOB.  Fair aeration bilaterally. CVS:  RRR. Heart sounds normal.  ABD/GI/GU: BS+. Abd soft, NTND.  MSK/EXT:  Moves extremities. No apparent deformity. No edema.  SKIN: no apparent  skin lesion or wound NEURO: Awake and alert. Oriented appropriately.  No apparent focal neuro deficit. PSYCH: Calm. Normal affect.   Procedures:  None  Microbiology summarized: [id number] and influenza PCR nonreactive. Full RVP negative. Blood cultures pending  Assessment and plan: Active Problems:   HTN (hypertension)   COPD (chronic obstructive pulmonary disease) (HCC)   Atrial fibrillation (HCC)   (HFpEF) heart failure with preserved ejection fraction (HCC)   Sleep apnea   Type 2 diabetes mellitus with diabetic neuropathy, without long-term current use of insulin (HCC)   Stage 3a chronic kidney disease (HCC)   Chronic anticoagulation   Post concussive syndrome   Severe sepsis (HCC)   Non-STEMI (non-ST elevated myocardial infarction) (HCC)   Hypotension   Morbid obesity (HCC)   Acute systolic CHF (congestive heart failure) ([place])  Severe sepsis likely due to pneumonia: POA.  Febrile to 103 with tachypnea.  She has dry hacking cough.  CXR raises concern for by basilar opacities but CTA chest negative for PE or pneumonia.  COVID-19, influenza PCR full RVP panel negative.  Blood cultures NGTD.  MRSA PCR negative.  No lactic acidosis but markedly elevated procalcitonin. -De-escalate antibiotic to ceftriaxone and doxycycline  Non-STEMI: Troponin trended from 10>>> 2438>> 1900.  Patient without chest pain breath DOE.  TTE with reduced EF (new).  Official read pending. -Cardiology on board-started IV  heparin.  Plan for heart catheterization on 10/13 -Hold Uloric.   Acute systolic CHF: BNP elevated.  TTE with LVEF of 35 to 40% (official read pending).  Appears euvolemic on exam. -Per cardiology. -Strict intake and output.  Hypotension: Due to sepsis?  Also concern for non-STEMI. -Continue holding antihypertensive meds.  Flulike illness-due to flu vaccine?  She had generalized weakness, confusion, fever, myalgia after flu vaccine.  COVID-19 and influenza PCR nonreactive.  Full RVP  panel negative. -Supportive care  Paroxysmal A-fib on Eliquis: In sinus rhythm. -Cardiology on board.  On home Tikosyn.  On heparin for anticoagulation.  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 Body mass index is 36.33 kg/m.           DVT prophylaxis:    Code Status: Full code Family Communication: None at bedside.  Attempted to call patient's son but no answer. Level of care: Telemetry Medical Status is: Inpatient Remains inpatient appropriate because: Severe sepsis, non-STEMI, acute systolic CHF   Final disposition: Remains inpatient Consultants:  Cardiology  Sch Meds:  Scheduled Meds:  amitriptyline  25 mg Oral QHS   azelastine  2 spray Each Nare BID   cholecalciferol  2,000 Units Oral QHS   dofetilide  250 mcg Oral BID   doxycycline  100 mg Oral Q12H   magnesium oxide  400 mg Oral QHS   revefenacin  175 mcg Nebulization Daily   Continuous Infusions:  cefTRIAXone (ROCEPHIN)  IV     heparin     potassium PHOSPHATE IVPB (in mmol) 30 mmol (03/19/22 0820)   PRN Meds:.acetaminophen **OR** acetaminophen, albuterol, guaiFENesin-dextromethorphan  Antimicrobials: Anti-infectives (From admission, onward)    Start     Dose/Rate Route Frequency Ordered Stop   03/19/22 1500  cefTRIAXone (ROCEPHIN) 1 g in sodium chloride 0.9 % 100 mL IVPB        1 g 200 mL/hr over 30 Minutes Intravenous Every 24 hours 03/19/22 1400 03/23/22 1459   03/19/22 1400  ceFEPIme (MAXIPIME) 2 g in sodium chloride 0.9 % 100 mL IVPB  Status:  Discontinued        2 g 200 mL/hr over 30 Minutes Intravenous Every 8 hours 03/19/22 0916 03/19/22 1400   03/19/22 1000  vancomycin (VANCOREADY) IVPB 1250 mg/250 mL   Status:  Discontinued        1,250 mg 166.7 mL/hr over 90 Minutes Intravenous Every 36 hours 03/18/22 1257 03/19/22 1331   03/19/22 1000  doxycycline (VIBRA-TABS) tablet 100 mg        100 mg Oral Every 12 hours 03/19/22 0726     03/19/22 0900  vancomycin (VANCOREADY) IVPB 1250 mg/250 mL  Status:  Discontinued        1,250 mg 166.7 mL/hr over 90 Minutes Intravenous Every 36 hours 03/17/22 2311 03/18/22 0027   03/19/22 0800  azithromycin (ZITHROMAX) 500 mg in sodium chloride 0.9 % 250 mL IVPB  Status:  Discontinued        500 mg 250 mL/hr over 60 Minutes Intravenous Every 24 hours 03/19/22 0714 03/19/22 0725   03/18/22 1315  ceFEPIme (MAXIPIME) 2 g in sodium chloride 0.9 % 100 mL IVPB  Status:  Discontinued        2 g 200 mL/hr over 30 Minutes Intravenous Every 12  hours 03/18/22 1257 03/19/22 0916   03/18/22 0730  ceFEPIme (MAXIPIME) 2 g in sodium chloride 0.9 % 100 mL IVPB  Status:  Discontinued        2 g 200 mL/hr over 30 Minutes Intravenous Every 12 hours 03/17/22 2311 03/18/22 0027   03/18/22 0000  cefTRIAXone (ROCEPHIN) 2 g in sodium chloride 0.9 % 100 mL IVPB  Status:  Discontinued        2 g 200 mL/hr over 30 Minutes Intravenous Every 24 hours 03/17/22 2356 03/18/22 1257   03/17/22 1915  vancomycin (VANCOREADY) IVPB 2000 mg/400 mL        2,000 mg 200 mL/hr over 120 Minutes Intravenous  Once 03/17/22 1901 03/18/22 0109   03/17/22 1915  ceFEPIme (MAXIPIME) 2 g in sodium chloride 0.9 % 100 mL IVPB        2 g 200 mL/hr over 30 Minutes Intravenous  Once 03/17/22 1901 03/17/22 2034        I have personally reviewed the following labs and images: CBC: Recent Labs  Lab 03/17/22 1847 03/19/22 0027  WBC 9.8 7.3  NEUTROABS 9.0*  --   HGB 11.8* 10.1*  HCT 36.8 29.8*  MCV 86.2 82.1  PLT 195 164   BMP &GFR Recent Labs  Lab 03/17/22 1847 03/18/22 0549 03/19/22 0026 03/19/22 0027  NA 134*  --  132*  --   K 4.1  --  3.2*  --   CL 102  --  102  --   CO2 21*  --  22  --    GLUCOSE 147*  --  163*  --   BUN 8  --  9  --   CREATININE 0.83 0.85 0.75  --   CALCIUM 9.0  --  8.4*  --   MG  --  1.9  --  2.0  PHOS  --   --  1.2*  --    Estimated Creatinine Clearance: 61 mL/min (by C-G formula based on SCr of 0.75 mg/dL). Liver & Pancreas: Recent Labs  Lab 03/17/22 1847 03/19/22 0026  AST 15  --   ALT 11  --   ALKPHOS 31*  --   BILITOT 0.8  --   PROT 6.3*  --   ALBUMIN 3.4* 2.7*   No results for input(s): "LIPASE", "AMYLASE" in the last 168 hours. No results for input(s): "AMMONIA" in the last 168 hours. Diabetic: No results for input(s): "HGBA1C" in the last 72 hours. Recent Labs  Lab 03/18/22 0832 03/19/22 0626  GLUCAP 137* 143*   Cardiac Enzymes: No results for input(s): "CKTOTAL", "CKMB", "CKMBINDEX", "TROPONINI" in the last 168 hours. No results for input(s): "PROBNP" in the last 8760 hours. Coagulation Profile: No results for input(s): "INR", "PROTIME" in the last 168 hours. Thyroid Function Tests: No results for input(s): "TSH", "T4TOTAL", "FREET4", "T3FREE", "THYROIDAB" in the last 72 hours. Lipid Profile: No results for input(s): "CHOL", "HDL", "LDLCALC", "TRIG", "CHOLHDL", "LDLDIRECT" in the last 72 hours. Anemia Panel: No results for input(s): "VITAMINB12", "FOLATE", "FERRITIN", "TIBC", "IRON", "RETICCTPCT" in the last 72 hours. Urine analysis:    Component Value Date/Time   COLORURINE YELLOW 03/17/2022 2244   APPEARANCEUR HAZY (A) 03/17/2022 2244   APPEARANCEUR Hazy 12/25/2012 1653   LABSPEC 1.012 03/17/2022 2244   LABSPEC 1.021 12/25/2012 1653   PHURINE 7.0 03/17/2022 [place] 03/17/2022 2244   GLUCOSEU NEGATIVE 08/07/2021 1219   HGBUR NEGATIVE 03/17/2022 [place] 03/17/2022 2244   BILIRUBINUR Negative  12/25/2012 [place] 03/17/2022 2244   PROTEINUR NEGATIVE 03/17/2022 2244   UROBILINOGEN 1.0 08/07/2021 1219   NITRITE NEGATIVE 03/17/2022 2244   LEUKOCYTESUR LARGE (A)  03/17/2022 2244   LEUKOCYTESUR Negative 12/25/2012 1653   Sepsis Labs: Invalid input(s): "PROCALCITONIN", "LACTICIDVEN"  Microbiology: Recent Results (from the past 240 hour(s))  Culture, blood (routine x 2)     Status: None (Preliminary result)   Collection Time: 03/17/22  6:47 PM   Specimen: BLOOD  Result Value Ref Range Status   Specimen Description BLOOD BLOOD LEFT FOREARM  Final   Special Requests   Final    BOTTLES DRAWN AEROBIC AND ANAEROBIC Blood Culture results may not be optimal due to an excessive volume of blood received in culture bottles   Culture   Final    NO GROWTH 2 DAYS Performed at [facility], [place] [address], [place], [place] [address]    Report Status PENDING  Incomplete  Resp Panel by RT-PCR (Flu A&B, Covid)     Status: None   Collection Time: 03/17/22  6:47 PM   Specimen: Nasal Swab  Result Value Ref Range Status   SARS Coronavirus 2 by RT PCR NEGATIVE NEGATIVE Final    Comment: (NOTE) SARS-CoV-2 target nucleic acids are NOT DETECTED.  The SARS-CoV-2 RNA is generally detectable in upper respiratory specimens during the acute phase of infection. The lowest concentration of SARS-CoV-2 viral copies this assay can detect is 138 copies/mL. A negative result does not preclude SARS-Cov-2 infection and should not be used as the sole basis for treatment or other patient management decisions. A negative result may occur with  improper specimen collection/handling, submission of specimen other than nasopharyngeal swab, presence of viral mutation(s) within the areas targeted by this assay, and inadequate number of viral copies(<138 copies/mL). A negative result must be combined with clinical observations, patient history, and epidemiological information. The expected result is Negative.  Fact Sheet for Patients:  EntrepreneurPulse.com.au  Fact Sheet for Healthcare Providers:  IncredibleEmployment.be  This test is no  t yet approved or cleared by the [place] FDA and  has been authorized for detection and/or diagnosis of SARS-CoV-2 by FDA under an Emergency Use Authorization (EUA). This EUA will remain  in effect (meaning this test can be used) for the duration of the COVID-19 declaration under Section 564(b)(1) of the Act, 21 U.S.C.section 360bbb-3(b)(1), unless the authorization is terminated  or revoked sooner.       Influenza A by PCR NEGATIVE NEGATIVE Final   Influenza B by PCR NEGATIVE NEGATIVE Final    Comment: (NOTE) The Xpert Xpress SARS-CoV-2/FLU/RSV plus assay is intended as an aid in the diagnosis of influenza from Nasopharyngeal swab specimens and should not be used as a sole basis for treatment. Nasal washings and aspirates are unacceptable for Xpert Xpress SARS-CoV-2/FLU/RSV testing.  Fact Sheet for Patients: EntrepreneurPulse.com.au  Fact Sheet for Healthcare Providers: IncredibleEmployment.be  This test is not yet approved or cleared by the [place] FDA and has been authorized for detection and/or diagnosis of SARS-CoV-2 by FDA under an Emergency Use Authorization (EUA). This EUA will remain in effect (meaning this test can be used) for the duration of the COVID-19 declaration under Section 564(b)(1) of the Act, 21 U.S.C. section 360bbb-3(b)(1), unless the authorization is terminated or revoked.  Performed at [facility], [place] [address], [place],  [address]   Culture, blood (routine x 2)     Status: None (Preliminary result)  Collection Time: 03/17/22  7:55 PM   Specimen: BLOOD  Result Value Ref Range Status   Specimen Description BLOOD BLOOD RIGHT HAND  Final   Special Requests   Final    BOTTLES DRAWN AEROBIC AND ANAEROBIC Blood Culture adequate volume   Culture   Final    NO GROWTH 2 DAYS Performed at [facility], [address], [place], [place] [address]    Report Status PENDING  Incomplete   Respiratory (~20 pathogens) panel by PCR     Status: None   Collection Time: 03/17/22  9:23 PM   Specimen: Respiratory  Result Value Ref Range Status   Adenovirus NOT DETECTED NOT DETECTED Final   Coronavirus 229E NOT DETECTED NOT DETECTED Final    Comment: (NOTE) The Coronavirus on the Respiratory Panel, DOES NOT test for the novel  Coronavirus (2019 nCoV)    Coronavirus HKU1 NOT DETECTED NOT DETECTED Final   Coronavirus NL63 NOT DETECTED NOT DETECTED Final   Coronavirus OC43 NOT DETECTED NOT DETECTED Final   Metapneumovirus NOT DETECTED NOT DETECTED Final   Rhinovirus / Enterovirus NOT DETECTED NOT DETECTED Final   Influenza A NOT DETECTED NOT DETECTED Final   Influenza B NOT DETECTED NOT DETECTED Final   Parainfluenza Virus 1 NOT DETECTED NOT DETECTED Final   Parainfluenza Virus 2 NOT DETECTED NOT DETECTED Final   Parainfluenza Virus 3 NOT DETECTED NOT DETECTED Final   Parainfluenza Virus 4 NOT DETECTED NOT DETECTED Final   Respiratory Syncytial Virus NOT DETECTED NOT DETECTED Final   Bordetella pertussis NOT DETECTED NOT DETECTED Final   Bordetella Parapertussis NOT DETECTED NOT DETECTED Final   Chlamydophila pneumoniae NOT DETECTED NOT DETECTED Final   Mycoplasma pneumoniae NOT DETECTED NOT DETECTED Final    Comment: Performed at [facility], [place], [place], [place] [address]  MRSA Next Gen by PCR, Nasal     Status: None   Collection Time: 03/19/22 12:03 AM   Specimen: Nasal Mucosa; Nasal Swab  Result Value Ref Range Status   MRSA by PCR Next Gen NOT DETECTED NOT DETECTED Final    Comment: (NOTE) The GeneXpert MRSA Assay (FDA approved for NASAL specimens only), is one component of a comprehensive MRSA colonization surveillance program. It is not intended to diagnose MRSA infection nor to guide or monitor treatment for MRSA infections. Test performance is not FDA approved in patients less than 22 years old. Performed at [facility], [place] [address] [place], [place] [address]     Radiology Studies: No results found.    Tahlor Berenguer T. Brinsmade  If 7PM-7AM, please contact night-coverage [email or web address] 03/19/2022, 2:00 PM

## 2022-03-20 ENCOUNTER — Encounter (HOSPITAL_COMMUNITY)
Admission: EM | Disposition: A | Discharge status: Home Health | Payer: Self-pay | Source: Skilled Nursing Facility | Attending: Student

## 2022-03-20 DIAGNOSIS — E1140 Type 2 diabetes mellitus with diabetic neuropathy, unspecified: Secondary | ICD-10-CM | POA: Diagnosis not present

## 2022-03-20 DIAGNOSIS — A419 Sepsis, unspecified organism: Secondary | ICD-10-CM | POA: Diagnosis not present

## 2022-03-20 DIAGNOSIS — N1831 Chronic kidney disease, stage 3a: Secondary | ICD-10-CM | POA: Diagnosis not present

## 2022-03-20 DIAGNOSIS — J441 Chronic obstructive pulmonary disease with (acute) exacerbation: Secondary | ICD-10-CM

## 2022-03-20 DIAGNOSIS — I5043 Acute on chronic combined systolic (congestive) and diastolic (congestive) heart failure: Secondary | ICD-10-CM

## 2022-03-20 DIAGNOSIS — G4730 Sleep apnea, unspecified: Secondary | ICD-10-CM | POA: Diagnosis not present

## 2022-03-20 HISTORY — PX: RIGHT/LEFT HEART CATH AND CORONARY ANGIOGRAPHY: CATH118266

## 2022-03-20 LAB — POCT I-STAT 7, (LYTES, BLD GAS, ICA,H+H)
Acid-base deficit: 7 mmol/L — ABNORMAL HIGH (ref 0.0–2.0)
Bicarbonate: 18.1 mmol/L — ABNORMAL LOW (ref 20.0–28.0)
Calcium, Ion: 1.19 mmol/L (ref 1.15–1.40)
HCT: 28 % — ABNORMAL LOW (ref 36.0–46.0)
Hemoglobin: 9.5 g/dL — ABNORMAL LOW (ref 12.0–15.0)
O2 Saturation: 96 %
Potassium: 4 mmol/L (ref 3.5–5.1)
Sodium: 132 mmol/L — ABNORMAL LOW (ref 135–145)
TCO2: 19 mmol/L — ABNORMAL LOW (ref 22–32)
pCO2 arterial: 33.4 mmHg (ref 32–48)
pH, Arterial: 7.343 — ABNORMAL LOW (ref 7.35–7.45)
pO2, Arterial: 87 mmHg (ref 83–108)

## 2022-03-20 LAB — POCT I-STAT EG7
Acid-base deficit: 5 mmol/L — ABNORMAL HIGH (ref 0.0–2.0)
Acid-base deficit: 6 mmol/L — ABNORMAL HIGH (ref 0.0–2.0)
Bicarbonate: 19 mmol/L — ABNORMAL LOW (ref 20.0–28.0)
Bicarbonate: 19.7 mmol/L — ABNORMAL LOW (ref 20.0–28.0)
Calcium, Ion: 1.2 mmol/L (ref 1.15–1.40)
Calcium, Ion: 1.22 mmol/L (ref 1.15–1.40)
HCT: 27 % — ABNORMAL LOW (ref 36.0–46.0)
HCT: 28 % — ABNORMAL LOW (ref 36.0–46.0)
Hemoglobin: 9.2 g/dL — ABNORMAL LOW (ref 12.0–15.0)
Hemoglobin: 9.5 g/dL — ABNORMAL LOW (ref 12.0–15.0)
O2 Saturation: 62 %
O2 Saturation: 65 %
Potassium: 4.3 mmol/L (ref 3.5–5.1)
Potassium: 4.3 mmol/L (ref 3.5–5.1)
Sodium: 137 mmol/L (ref 135–145)
Sodium: 137 mmol/L (ref 135–145)
TCO2: 20 mmol/L — ABNORMAL LOW (ref 22–32)
TCO2: 21 mmol/L — ABNORMAL LOW (ref 22–32)
pCO2, Ven: 34.2 mmHg — ABNORMAL LOW (ref 44–60)
pCO2, Ven: 35.2 mmHg — ABNORMAL LOW (ref 44–60)
pH, Ven: 7.353 (ref 7.25–7.43)
pH, Ven: 7.355 (ref 7.25–7.43)
pO2, Ven: 33 mmHg (ref 32–45)
pO2, Ven: 35 mmHg (ref 32–45)

## 2022-03-20 LAB — BASIC METABOLIC PANEL
Anion gap: 9 (ref 5–15)
BUN: 7 mg/dL — ABNORMAL LOW (ref 8–23)
CO2: 21 mmol/L — ABNORMAL LOW (ref 22–32)
Calcium: 8.6 mg/dL — ABNORMAL LOW (ref 8.9–10.3)
Chloride: 107 mmol/L (ref 98–111)
Creatinine, Ser: 0.61 mg/dL (ref 0.44–1.00)
GFR, Estimated: >60 mL/min (ref >60)
Glucose, Bld: 128 mg/dL — ABNORMAL HIGH (ref 70–99)
Potassium: 4.4 mmol/L (ref 3.5–5.1)
Sodium: 137 mmol/L (ref 135–145)

## 2022-03-20 LAB — CBC
HCT: 31.7 % — ABNORMAL LOW (ref 36.0–46.0)
Hemoglobin: 10.4 g/dL — ABNORMAL LOW (ref 12.0–15.0)
MCH: 27.4 pg (ref 26.0–34.0)
MCHC: 32.8 g/dL (ref 30.0–36.0)
MCV: 83.6 fL (ref 80.0–100.0)
Platelets: 177 10*3/uL (ref 150–400)
RBC: 3.79 MIL/uL — ABNORMAL LOW (ref 3.87–5.11)
RDW: 14.2 % (ref 11.5–15.5)
WBC: 6.8 10*3/uL (ref 4.0–10.5)
nRBC: 0 % (ref 0.0–0.2)

## 2022-03-20 LAB — LEGIONELLA PNEUMOPHILA SEROGP 1 UR AG: L. pneumophila Serogp 1 Ur Ag: NEGATIVE

## 2022-03-20 LAB — PROCALCITONIN: Procalcitonin: 5.88 ng/mL

## 2022-03-20 LAB — MAGNESIUM: Magnesium: 2 mg/dL (ref 1.7–2.4)

## 2022-03-20 LAB — HEPARIN LEVEL (UNFRACTIONATED): Heparin Unfractionated: 0.21 IU/mL — ABNORMAL LOW (ref 0.30–0.70)

## 2022-03-20 LAB — GLUCOSE, CAPILLARY: Glucose-Capillary: 148 mg/dL — ABNORMAL HIGH (ref 70–99)

## 2022-03-20 LAB — APTT: aPTT: 39 seconds — ABNORMAL HIGH (ref 24–36)

## 2022-03-20 LAB — BRAIN NATRIURETIC PEPTIDE: B Natriuretic Peptide: 305.9 pg/mL — ABNORMAL HIGH (ref 0.0–100.0)

## 2022-03-20 SURGERY — RIGHT/LEFT HEART CATH AND CORONARY ANGIOGRAPHY
Anesthesia: LOCAL

## 2022-03-20 MED ORDER — FENTANYL CITRATE (PF) 100 MCG/2ML IJ SOLN
INTRAMUSCULAR | Status: DC | PRN
  Administered 2022-03-20: 25 ug via INTRAVENOUS

## 2022-03-20 MED ORDER — IPRATROPIUM-ALBUTEROL 0.5-2.5 (3) MG/3ML IN SOLN
3.0000 mL | RESPIRATORY_TRACT | Status: DC | PRN
  Administered 2022-03-21: 3 mL via RESPIRATORY_TRACT
  Filled 2022-03-20: qty 3

## 2022-03-20 MED ORDER — HEPARIN SODIUM (PORCINE) 1000 UNIT/ML IJ SOLN
INTRAMUSCULAR | Status: AC
  Filled 2022-03-20: qty 10

## 2022-03-20 MED ORDER — LIDOCAINE HCL (PF) 1 % IJ SOLN
INTRAMUSCULAR | Status: AC
  Filled 2022-03-20: qty 30

## 2022-03-20 MED ORDER — SODIUM CHLORIDE 0.9 % IV SOLN: 250.0000 mL | INTRAVENOUS | Status: DC | PRN

## 2022-03-20 MED ORDER — HEPARIN (PORCINE) IN NACL 1000-0.9 UT/500ML-% IV SOLN
INTRAVENOUS | Status: DC | PRN
  Administered 2022-03-20 (×2): 500 mL

## 2022-03-20 MED ORDER — VERAPAMIL HCL 2.5 MG/ML IV SOLN
INTRAVENOUS | Status: AC
  Filled 2022-03-20: qty 2

## 2022-03-20 MED ORDER — HEPARIN (PORCINE) IN NACL 1000-0.9 UT/500ML-% IV SOLN
INTRAVENOUS | Status: AC
  Filled 2022-03-20: qty 1000

## 2022-03-20 MED ORDER — LIDOCAINE HCL (PF) 1 % IJ SOLN
INTRAMUSCULAR | Status: DC | PRN
  Administered 2022-03-20: 10 mL

## 2022-03-20 MED ORDER — SODIUM CHLORIDE 0.9% FLUSH: 3.0000 mL | INTRAVENOUS | Status: DC | PRN

## 2022-03-20 MED ORDER — FLUTICASONE FUROATE-VILANTEROL 200-25 MCG/ACT IN AEPB
1.0000 | INHALATION_SPRAY | Freq: Every day | RESPIRATORY_TRACT | Status: DC
  Filled 2022-03-20: qty 28

## 2022-03-20 MED ORDER — PANTOPRAZOLE SODIUM 40 MG PO TBEC
40.0000 mg | DELAYED_RELEASE_TABLET | Freq: Every day | ORAL | Status: DC
  Administered 2022-03-20 – 2022-03-22 (×3): 40 mg via ORAL
  Filled 2022-03-20 (×3): qty 1

## 2022-03-20 MED ORDER — MIDAZOLAM HCL 2 MG/2ML IJ SOLN
INTRAMUSCULAR | Status: DC | PRN
  Administered 2022-03-20: 1 mg via INTRAVENOUS

## 2022-03-20 MED ORDER — IOHEXOL 350 MG/ML SOLN
INTRAVENOUS | Status: DC | PRN
  Administered 2022-03-20: 60 mL

## 2022-03-20 MED ORDER — SODIUM CHLORIDE 0.9 % IV SOLN: INTRAVENOUS | Status: DC

## 2022-03-20 MED ORDER — METHYLPREDNISOLONE SODIUM SUCC 40 MG IJ SOLR
40.0000 mg | Freq: Two times a day (BID) | INTRAMUSCULAR | Status: DC
  Administered 2022-03-20 – 2022-03-22 (×5): 40 mg via INTRAVENOUS
  Filled 2022-03-20 (×5): qty 1

## 2022-03-20 MED ORDER — RIVAROXABAN 20 MG PO TABS
20.0000 mg | ORAL_TABLET | Freq: Every day | ORAL | Status: DC
  Administered 2022-03-21 – 2022-03-22 (×2): 20 mg via ORAL
  Filled 2022-03-20 (×2): qty 1

## 2022-03-20 MED ORDER — SODIUM CHLORIDE 0.9% FLUSH
3.0000 mL | Freq: Two times a day (BID) | INTRAVENOUS | Status: DC
  Administered 2022-03-20 – 2022-03-22 (×4): 3 mL via INTRAVENOUS

## 2022-03-20 MED ORDER — FENTANYL CITRATE (PF) 100 MCG/2ML IJ SOLN
INTRAMUSCULAR | Status: AC
  Filled 2022-03-20: qty 2

## 2022-03-20 MED ORDER — MIDAZOLAM HCL 2 MG/2ML IJ SOLN
INTRAMUSCULAR | Status: AC
  Filled 2022-03-20: qty 2

## 2022-03-20 SURGICAL SUPPLY — 17 items
CATH INFINITI 5FR AL1 (CATHETERS) IMPLANT
CATH INFINITI 5FR MULTPACK ANG (CATHETERS) IMPLANT
CATH SWAN GANZ 7F STRAIGHT (CATHETERS) IMPLANT
GLIDESHEATH SLEND SS 6F .021 (SHEATH) IMPLANT
GUIDEWIRE INQWIRE 1.5J.035X260 (WIRE) IMPLANT
INQWIRE 1.5J .035X260CM (WIRE)
KIT HEART LEFT (KITS) ×1 IMPLANT
PACK CARDIAC CATHETERIZATION (CUSTOM PROCEDURE TRAY) ×1 IMPLANT
SHEATH GLIDE SLENDER 4/5FR (SHEATH) IMPLANT
SHEATH PINNACLE 5F 10CM (SHEATH) IMPLANT
SHEATH PINNACLE 7F 10CM (SHEATH) IMPLANT
SHEATH PROBE COVER 6X72 (BAG) IMPLANT
SYR MEDRAD MARK 7 150ML (SYRINGE) ×1 IMPLANT
TRANSDUCER W/STOPCOCK (MISCELLANEOUS) ×1 IMPLANT
TUBING CIL FLEX 10 FLL-RA (TUBING) ×1 IMPLANT
WIRE EMERALD 3MM-J .025X260CM (WIRE) IMPLANT
WIRE EMERALD 3MM-J .035X150CM (WIRE) IMPLANT

## 2022-03-20 NOTE — Care Management Important Message (Signed)
Important Message  Patient Details  Name: Kathleen [name] MRN: [id number] Date of Birth: 14-Sep-1945   Medicare Important Message Given:  Yes     Orbie Pyo 03/20/2022, 2:19 PM

## 2022-03-20 NOTE — H&P (View-Only) (Signed)
Rounding Note    Patient Name: Kathleen [name] Date of Encounter: 03/20/2022  [place] Cardiologist: None   Subjective   Better this morning, but had trouble breathing last night. Had to increase her O2 to 5L. No chest pain.   Inpatient Medications    Scheduled Meds:  amitriptyline  25 mg Oral QHS   azelastine  2 spray Each Nare BID   cholecalciferol  2,000 Units Oral QHS   dofetilide  250 mcg Oral BID   doxycycline  100 mg Oral Q12H   magnesium oxide  400 mg Oral QHS   revefenacin  175 mcg Nebulization Daily   sodium chloride flush  3 mL Intravenous Q12H   Continuous Infusions:  sodium chloride     sodium chloride     cefTRIAXone (ROCEPHIN)  IV 1 g (03/19/22 1549)   heparin 1,500 Units/hr (03/20/22 0335)   PRN Meds: sodium chloride, acetaminophen **OR** acetaminophen, albuterol, guaiFENesin-dextromethorphan, sodium chloride flush   Vital Signs    Vitals:   03/19/22 2311 03/20/22 0335 03/20/22 0614 03/20/22 0815  BP: 127/69 (!) 112/58  (!) 100/55  Pulse: 84 87  75  Resp: (!) 22 20  20   Temp: 99.3 F (37.4 C) 98.1 F (36.7 C)  97.9 F (36.6 C)  TempSrc: Oral Oral  Oral  SpO2: 100% 99%  98%  Weight:   87.9 kg   Height:        Intake/Output Summary (Last 24 hours) at 03/20/2022 0835 Last data filed at 03/20/2022 0340 Gross per 24 hour  Intake --  Output 0 ml  Net 0 ml      03/20/2022    6:14 AM 03/19/2022   11:26 AM 03/18/2022    8:42 PM  Last 3 Weights  Weight (lbs) 193 lb 12.6 oz 192 lb 4.8 oz 193 lb 9 oz  Weight (kg) 87.9 kg 87.227 kg 87.8 kg      Telemetry    Sinus Rhythm - Personally Reviewed  ECG    No new tracing  Physical Exam   GEN: Obese older female,  @5L  Neck: No JVD Cardiac: RRR, no murmurs, rubs, or gallops.  Respiratory: Diminished in bases, rhonchi GI: Soft, nontender, non-distended  MS: No edema; No deformity. Neuro:  Nonfocal  Psych: Normal affect   Labs    High Sensitivity Troponin:   Recent  Labs  Lab 03/17/22 1847 03/18/22 0549 03/18/22 0846 03/18/22 1146  TROPONINIHS 10 2,438* 1,869* 1,658*     Chemistry Recent Labs  Lab 03/17/22 1847 03/18/22 0549 03/19/22 0026 03/19/22 0027 03/20/22 0202  NA 134*  --  132*  --  137  K 4.1  --  3.2*  --  4.4  CL 102  --  102  --  107  CO2 21*  --  22  --  21*  GLUCOSE 147*  --  163*  --  128*  BUN 8  --  9  --  7*  CREATININE 0.83 0.85 0.75  --  0.61  CALCIUM 9.0  --  8.4*  --  8.6*  MG  --  1.9  --  2.0 2.0  PROT 6.3*  --   --   --   --   ALBUMIN 3.4*  --  2.7*  --   --   AST 15  --   --   --   --   ALT 11  --   --   --   --   [facility]  31*  --   --   --   --   BILITOT 0.8  --   --   --   --   GFRNONAA >60 >60 >60  --  >60  ANIONGAP 11  --  8  --  9    Lipids No results for input(s): "CHOL", "TRIG", "HDL", "LABVLDL", "LDLCALC", "CHOLHDL" in the last 168 hours.  Hematology Recent Labs  Lab 03/17/22 1847 03/19/22 0027 03/20/22 0202  WBC 9.8 7.3 6.8  RBC 4.27 3.63* 3.79*  HGB 11.8* 10.1* 10.4*  HCT 36.8 29.8* 31.7*  MCV 86.2 82.1 83.6  MCH 27.6 27.8 27.4  MCHC 32.1 33.9 32.8  RDW 14.1 14.3 14.2  PLT 195 164 177   Thyroid No results for input(s): "TSH", "FREET4" in the last 168 hours.  BNP Recent Labs  Lab 03/17/22 1841 03/18/22 0541 03/20/22 0202  BNP 195.9* 438.3* 305.9*    DDimer No results for input(s): "DDIMER" in the last 168 hours.   Radiology    ECHOCARDIOGRAM COMPLETE  Result Date: 03/19/2022    ECHOCARDIOGRAM REPORT   Patient Name:   Kathleen [name] Date of Exam: 03/19/2022 Medical Rec #:  [id number]       Height:       61.0 in Accession #:    [id number]      Weight:       193.6 lb Date of Birth:  [birth year]       BSA:          1.863 m Patient Age:    76 years        BP:           106/46 mmHg Patient Gender: F               HR:           77 bpm. Exam Location:  Inpatient Procedure: 2D Echo, 3D Echo, Cardiac Doppler, Color Doppler and Intracardiac            Opacification Agent Indications:     NSTEMI I21.4  History:        Patient has no prior history of Echocardiogram examinations.                 Cardiomyopathy and CHF, Pacemaker, COPD, Arrythmias:Atrial                 Fibrillation and Atrial Flutter; Risk Factors:Hypertension.  Sonographer:    Bernadene Person RDCS Referring Phys: Wendee Beavers, T IMPRESSIONS  1. Left ventricular ejection fraction, by estimation, is 35 to 40%. The left ventricle has moderately decreased function. The left ventricle demonstrates regional wall motion abnormalities (see scoring diagram/findings for description). Left ventricular  diastolic parameters are consistent with Grade I diastolic dysfunction (impaired relaxation). Elevated left ventricular end-diastolic pressure.  2. Right ventricular systolic function is normal. The right ventricular size is normal. There is mildly elevated pulmonary artery systolic pressure.  3. Right atrial size was mildly dilated.  4. The mitral valve is normal in structure. Mild mitral valve regurgitation. No evidence of mitral stenosis.  5. The aortic valve is tricuspid. There is mild calcification of the aortic valve. Aortic valve regurgitation is not visualized. No aortic stenosis is present.  6. Aortic dilatation noted. There is mild dilatation of the ascending aorta, measuring 37 mm.  7. The inferior vena cava is dilated in size with >50% respiratory variability, suggesting right atrial pressure of 8 mmHg. FINDINGS  Left Ventricle: Left ventricular ejection fraction, by  estimation, is 35 to 40%. The left ventricle has moderately decreased function. The left ventricle demonstrates regional wall motion abnormalities. Definity contrast agent was given IV to delineate the left ventricular endocardial borders. The left ventricular internal cavity size was normal in size. There is no left ventricular hypertrophy. Left ventricular diastolic parameters are consistent with Grade I diastolic dysfunction (impaired relaxation). Elevated left  ventricular end-diastolic pressure.  LV Wall Scoring: The entire septum, entire inferior wall, and apex are hypokinetic. The entire anterior wall and entire lateral wall are normal. Right Ventricle: The right ventricular size is normal. No increase in right ventricular wall thickness. Right ventricular systolic function is normal. There is mildly elevated pulmonary artery systolic pressure. The tricuspid regurgitant velocity is 2.72  m/s, and with an assumed right atrial pressure of 8 mmHg, the estimated right ventricular systolic pressure is 37.6 mmHg. Left Atrium: Left atrial size was normal in size. Right Atrium: Right atrial size was mildly dilated. Pericardium: There is no evidence of pericardial effusion. Mitral Valve: The mitral valve is normal in structure. Mild mitral valve regurgitation, with posteriorly-directed jet. No evidence of mitral valve stenosis. Tricuspid Valve: The tricuspid valve is normal in structure. Tricuspid valve regurgitation is mild . No evidence of tricuspid stenosis. Aortic Valve: The aortic valve is tricuspid. There is mild calcification of the aortic valve. Aortic valve regurgitation is not visualized. No aortic stenosis is present. Pulmonic Valve: The pulmonic valve was normal in structure. Pulmonic valve regurgitation is trivial. No evidence of pulmonic stenosis. Aorta: Aortic dilatation noted. There is mild dilatation of the ascending aorta, measuring 37 mm. Venous: The inferior vena cava is dilated in size with greater than 50% respiratory variability, suggesting right atrial pressure of 8 mmHg. IAS/Shunts: No atrial level shunt detected by color flow Doppler.  LEFT VENTRICLE PLAX 2D LVIDd:         5.10 cm     Diastology LVIDs:         4.10 cm     LV e' medial:    5.36 cm/s LV PW:         1.20 cm     LV E/e' medial:  14.3 LV IVS:        1.00 cm     LV e' lateral:   6.41 cm/s LVOT diam:     2.10 cm     LV E/e' lateral: 12.0 LV SV:         62 LV SV Index:   33 LVOT Area:     3.46  cm                             3D Volume EF: LV Volumes (MOD)           3D EF:        39 % LV vol d, MOD A2C: 86.2 ml LV EDV:       135 ml LV vol d, MOD A4C: 90.6 ml LV ESV:       83 ml LV vol s, MOD A2C: 54.0 ml LV SV:        52 ml LV vol s, MOD A4C: 61.6 ml LV SV MOD A2C:     32.2 ml LV SV MOD A4C:     90.6 ml LV SV MOD BP:      30.7 ml RIGHT VENTRICLE RV S prime:     11.50 cm/s TAPSE (M-mode): 2.1 cm LEFT ATRIUM  Index        RIGHT ATRIUM           Index LA diam:        3.50 cm 1.88 cm/m   RA Area:     19.30 cm LA Vol (A2C):   64.0 ml 34.36 ml/m  RA Volume:   55.90 ml  30.01 ml/m LA Vol (A4C):   53.3 ml 28.61 ml/m LA Biplane Vol: 58.9 ml 31.62 ml/m  AORTIC VALVE LVOT Vmax:   106.00 cm/s LVOT Vmean:  66.000 cm/s LVOT VTI:    0.180 m  AORTA Ao Asc diam: 3.70 cm MITRAL VALVE               TRICUSPID VALVE MV Area (PHT): 3.99 cm    TR Peak grad:   29.6 mmHg MV Decel Time: 190 msec    TR Vmax:        272.00 cm/s MV E velocity: 76.70 cm/s MV A velocity: 66.00 cm/s  SHUNTS MV E/A ratio:  1.16        Systemic VTI:  0.18 m                            Systemic Diam: 2.10 cm Skeet Latch MD Electronically signed by Skeet Latch MD Signature Date/Time: 03/19/2022/2:21:27 PM    Final    CT Angio Chest Pulmonary Embolism (PE) W or WO Contrast  Result Date: 03/18/2022 CLINICAL DATA:  High clinical suspicion for PE EXAM: CT ANGIOGRAPHY CHEST WITH CONTRAST TECHNIQUE: Multidetector CT imaging of the chest was performed using the standard protocol during [name] administration of intravenous contrast. Multiplanar CT image reconstructions and MIPs were obtained to evaluate the vascular anatomy. RADIATION DOSE REDUCTION: This exam was performed according to the departmental dose-optimization program which includes automated exposure control, adjustment of the mA and/or kV according to patient size and/or use of iterative reconstruction technique. CONTRAST:  78mL OMNIPAQUE IOHEXOL 350 MG/ML SOLN COMPARISON:   Previous CT done on 09/03/2021, chest radiograph done on 03/17/2022 FINDINGS: Cardiovascular: There are scattered coronary artery calcifications. Pacemaker battery is seen in the left infraclavicular region. Contrast density in the lumen of thoracic aorta is less than adequate to evaluate for dissection. There is ectasia of main pulmonary artery measuring 3.8 cm suggesting pulmonary arterial hypertension. There are no intraluminal filling defects in central pulmonary artery branches. Motion artifacts limit evaluation of small peripheral branches. Mediastinum/Nodes: No significant lymphadenopathy seen. Lungs/Pleura: Centrilobular emphysema is seen. Small linear densities are seen in lower lung fields suggesting scarring or subsegmental atelectasis. There is no focal pulmonary consolidation. There is no pleural effusion or pneumothorax. There is narrowing of AP diameter of trachea and mainstem bronchi suggesting possible bronchospasm. Upper Abdomen: No acute findings are seen. Musculoskeletal: Degenerative changes are noted in both shoulders. Degenerative changes are noted in lower thoracic spine. Review of the MIP images confirms the above findings. IMPRESSION: There is no evidence of pulmonary artery embolism. There is ectasia of main pulmonary artery suggesting pulmonary arterial hypertension. Coronary artery calcifications are seen. Centrilobular emphysema. Linear densities in the lower lung fields suggest scarring or subsegmental atelectasis. Electronically Signed   By: Elmer Picker M.D.   On: 03/18/2022 13:09    Cardiac Studies   Echo: 03/19/22  IMPRESSIONS     1. Left ventricular ejection fraction, by estimation, is 35 to 40%. The  left ventricle has moderately decreased function. The left ventricle  demonstrates regional wall motion abnormalities (see  scoring  diagram/findings for description). Left ventricular   diastolic parameters are consistent with Grade I diastolic dysfunction   (impaired relaxation). Elevated left ventricular end-diastolic pressure.   2. Right ventricular systolic function is normal. The right ventricular  size is normal. There is mildly elevated pulmonary artery systolic  pressure.   3. Right atrial size was mildly dilated.   4. The mitral valve is normal in structure. Mild mitral valve  regurgitation. No evidence of mitral stenosis.   5. The aortic valve is tricuspid. There is mild calcification of the  aortic valve. Aortic valve regurgitation is not visualized. No aortic  stenosis is present.   6. Aortic dilatation noted. There is mild dilatation of the ascending  aorta, measuring 37 mm.   7. The inferior vena cava is dilated in size with >50% respiratory  variability, suggesting right atrial pressure of 8 mmHg.   FINDINGS   Left Ventricle: Left ventricular ejection fraction, by estimation, is 35  to 40%. The left ventricle has moderately decreased function. The left  ventricle demonstrates regional wall motion abnormalities. Definity  contrast agent was given IV to delineate  the left ventricular endocardial borders. The left ventricular internal  cavity size was normal in size. There is no left ventricular hypertrophy.  Left ventricular diastolic parameters are consistent with Grade I  diastolic dysfunction (impaired  relaxation). Elevated left ventricular end-diastolic pressure.      LV Wall Scoring:  The entire septum, entire inferior wall, and apex are hypokinetic. The  entire  anterior wall and entire lateral wall are normal.   Right Ventricle: The right ventricular size is normal. No increase in  right ventricular wall thickness. Right ventricular systolic function is  normal. There is mildly elevated pulmonary artery systolic pressure. The  tricuspid regurgitant velocity is 2.72   m/s, and with an assumed right atrial pressure of 8 mmHg, the estimated  right ventricular systolic pressure is 123456 mmHg.   Left Atrium: Left  atrial size was normal in size.   Right Atrium: Right atrial size was mildly dilated.   Pericardium: There is no evidence of pericardial effusion.   Mitral Valve: The mitral valve is normal in structure. Mild mitral valve  regurgitation, with posteriorly-directed jet. No evidence of mitral valve  stenosis.   Tricuspid Valve: The tricuspid valve is normal in structure. Tricuspid  valve regurgitation is mild . No evidence of tricuspid stenosis.   Aortic Valve: The aortic valve is tricuspid. There is mild calcification  of the aortic valve. Aortic valve regurgitation is not visualized. No  aortic stenosis is present.   Pulmonic Valve: The pulmonic valve was normal in structure. Pulmonic valve  regurgitation is trivial. No evidence of pulmonic stenosis.   Aorta: Aortic dilatation noted. There is mild dilatation of the ascending  aorta, measuring 37 mm.   Venous: The inferior vena cava is dilated in size with greater than 50%  respiratory variability, suggesting right atrial pressure of 8 mmHg.   IAS/Shunts: No atrial level shunt detected by color flow Doppler.   Patient Profile     76 y.o. 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 Cyndia Skeeters.    Assessment & Plan    Elevated Troponin -- in the setting of acute illness, no anginal symptoms. hsTn peaked  at 2438. EKG sinus with V-pacing. Unclear etiology of elevated troponin, could be demand but echo yesterday with drop in LVEF 35-40%.  -- Now planned for [facility] for ischemia work up    Sepsis Suspected PNA BOOP Acute on chronic hypoxia -- on chronic 4L [place], follows with pulmonary as an outpatient -- respiratory panel negative. BC pending. CT negative for PE suggestion of pulmonary arterial  hypertension, emphysema with lower lung field suggesting scarring versus subsegmental atelectasis -- antibiotics per primary team -- increased O2 requirements, planned for [facility] to further define etiology   Persistent Afib S/p AVN ablation with PPM '17 -- maintaining sinus rhythm -- continue Tikosyn (QTc 0.41 this morning on telemetry) -- Xarelto held with need for cath, on IV heparin    HFrEF (EF 35-40%) -- last echo at [facility] with preserved LV function, does not appear volume overloaded on exam -- repeat echo showed drop in LVEF to 35-40%, g1DD, normal RV size and function, mildly dilated RA, elevated LVEDP with hypokinetic septum and inferior wall.  -- GDMT has been limited in the past, unable to tolerate Entresto due to low BP and no SGLT2i with UTIs   Hypotension -- likely from hypovolemia/sepsis -- home BP meds held presently   Hypokalemia -- resolved    For questions or updates, please contact [place] Please consult [email or web address] for contact info under        Signed, Reino Bellis, NP  03/20/2022, 8:35 AM

## 2022-03-20 NOTE — Progress Notes (Signed)
Rounding Note    Patient Name: Kathleen [name] Date of Encounter: 03/20/2022  [place] Cardiologist: None   Subjective   Better this morning, but had trouble breathing last night. Had to increase her O2 to 5L. No chest pain.   Inpatient Medications    Scheduled Meds:  amitriptyline  25 mg Oral QHS   azelastine  2 spray Each Nare BID   cholecalciferol  2,000 Units Oral QHS   dofetilide  250 mcg Oral BID   doxycycline  100 mg Oral Q12H   magnesium oxide  400 mg Oral QHS   revefenacin  175 mcg Nebulization Daily   sodium chloride flush  3 mL Intravenous Q12H   Continuous Infusions:  sodium chloride     sodium chloride     cefTRIAXone (ROCEPHIN)  IV 1 g (03/19/22 1549)   heparin 1,500 Units/hr (03/20/22 0335)   PRN Meds: sodium chloride, acetaminophen **OR** acetaminophen, albuterol, guaiFENesin-dextromethorphan, sodium chloride flush   Vital Signs    Vitals:   03/19/22 2311 03/20/22 0335 03/20/22 0614 03/20/22 0815  BP: 127/69 (!) 112/58  (!) 100/55  Pulse: 84 87  75  Resp: (!) 22 20  20   Temp: 99.3 F (37.4 C) 98.1 F (36.7 C)  97.9 F (36.6 C)  TempSrc: Oral Oral  Oral  SpO2: 100% 99%  98%  Weight:   87.9 kg   Height:        Intake/Output Summary (Last 24 hours) at 03/20/2022 0835 Last data filed at 03/20/2022 0340 Gross per 24 hour  Intake --  Output 0 ml  Net 0 ml      03/20/2022    6:14 AM 03/19/2022   11:26 AM 03/18/2022    8:42 PM  Last 3 Weights  Weight (lbs) 193 lb 12.6 oz 192 lb 4.8 oz 193 lb 9 oz  Weight (kg) 87.9 kg 87.227 kg 87.8 kg      Telemetry    Sinus Rhythm - Personally Reviewed  ECG    No new tracing  Physical Exam   GEN: Obese older female, [place] @5L  Neck: No JVD Cardiac: RRR, no murmurs, rubs, or gallops.  Respiratory: Diminished in bases, rhonchi GI: Soft, nontender, non-distended  MS: No edema; No deformity. Neuro:  Nonfocal  Psych: Normal affect   Labs    High Sensitivity Troponin:   Recent  Labs  Lab 03/17/22 1847 03/18/22 0549 03/18/22 0846 03/18/22 1146  TROPONINIHS 10 2,438* 1,869* 1,658*     Chemistry Recent Labs  Lab 03/17/22 1847 03/18/22 0549 03/19/22 0026 03/19/22 0027 03/20/22 0202  NA 134*  --  132*  --  137  K 4.1  --  3.2*  --  4.4  CL 102  --  102  --  107  CO2 21*  --  22  --  21*  GLUCOSE 147*  --  163*  --  128*  BUN 8  --  9  --  7*  CREATININE 0.83 0.85 0.75  --  0.61  CALCIUM 9.0  --  8.4*  --  8.6*  MG  --  1.9  --  2.0 2.0  PROT 6.3*  --   --   --   --   ALBUMIN 3.4*  --  2.7*  --   --   AST 15  --   --   --   --   ALT 11  --   --   --   --   [facility]  31*  --   --   --   --   BILITOT 0.8  --   --   --   --   GFRNONAA >60 >60 >60  --  >60  ANIONGAP 11  --  8  --  9    Lipids No results for input(s): "CHOL", "TRIG", "HDL", "LABVLDL", "LDLCALC", "CHOLHDL" in the last 168 hours.  Hematology Recent Labs  Lab 03/17/22 1847 03/19/22 0027 03/20/22 0202  WBC 9.8 7.3 6.8  RBC 4.27 3.63* 3.79*  HGB 11.8* 10.1* 10.4*  HCT 36.8 29.8* 31.7*  MCV 86.2 82.1 83.6  MCH 27.6 27.8 27.4  MCHC 32.1 33.9 32.8  RDW 14.1 14.3 14.2  PLT 195 164 177   Thyroid No results for input(s): "TSH", "FREET4" in the last 168 hours.  BNP Recent Labs  Lab 03/17/22 1841 03/18/22 0541 03/20/22 0202  BNP 195.9* 438.3* 305.9*    DDimer No results for input(s): "DDIMER" in the last 168 hours.   Radiology    ECHOCARDIOGRAM COMPLETE  Result Date: 03/19/2022    ECHOCARDIOGRAM REPORT   Patient Name:   Kathleen [name] Date of Exam: 03/19/2022 Medical Rec #:  [id number]       Height:       61.0 in Accession #:    [id number]      Weight:       193.6 lb Date of Birth:  01-28-1946       BSA:          1.863 m Patient Age:    76 years        BP:           106/46 mmHg Patient Gender: F               HR:           77 bpm. Exam Location:  Inpatient Procedure: 2D Echo, 3D Echo, Cardiac Doppler, Color Doppler and Intracardiac            Opacification Agent Indications:     NSTEMI I21.4  History:        Patient has no prior history of Echocardiogram examinations.                 Cardiomyopathy and CHF, Pacemaker, COPD, Arrythmias:Atrial                 Fibrillation and Atrial Flutter; Risk Factors:Hypertension.  Sonographer:    Bernadene Person RDCS Referring Phys: Wendee Beavers, T IMPRESSIONS  1. Left ventricular ejection fraction, by estimation, is 35 to 40%. The left ventricle has moderately decreased function. The left ventricle demonstrates regional wall motion abnormalities (see scoring diagram/findings for description). Left ventricular  diastolic parameters are consistent with Grade I diastolic dysfunction (impaired relaxation). Elevated left ventricular end-diastolic pressure.  2. Right ventricular systolic function is normal. The right ventricular size is normal. There is mildly elevated pulmonary artery systolic pressure.  3. Right atrial size was mildly dilated.  4. The mitral valve is normal in structure. Mild mitral valve regurgitation. No evidence of mitral stenosis.  5. The aortic valve is tricuspid. There is mild calcification of the aortic valve. Aortic valve regurgitation is not visualized. No aortic stenosis is present.  6. Aortic dilatation noted. There is mild dilatation of the ascending aorta, measuring 37 mm.  7. The inferior vena cava is dilated in size with >50% respiratory variability, suggesting right atrial pressure of 8 mmHg. FINDINGS  Left Ventricle: Left ventricular ejection fraction, by  estimation, is 35 to 40%. The left ventricle has moderately decreased function. The left ventricle demonstrates regional wall motion abnormalities. Definity contrast agent was given IV to delineate the left ventricular endocardial borders. The left ventricular internal cavity size was normal in size. There is no left ventricular hypertrophy. Left ventricular diastolic parameters are consistent with Grade I diastolic dysfunction (impaired relaxation). Elevated left  ventricular end-diastolic pressure.  LV Wall Scoring: The entire septum, entire inferior wall, and apex are hypokinetic. The entire anterior wall and entire lateral wall are normal. Right Ventricle: The right ventricular size is normal. No increase in right ventricular wall thickness. Right ventricular systolic function is normal. There is mildly elevated pulmonary artery systolic pressure. The tricuspid regurgitant velocity is 2.72  m/s, and with an assumed right atrial pressure of 8 mmHg, the estimated right ventricular systolic pressure is 37.6 mmHg. Left Atrium: Left atrial size was normal in size. Right Atrium: Right atrial size was mildly dilated. Pericardium: There is no evidence of pericardial effusion. Mitral Valve: The mitral valve is normal in structure. Mild mitral valve regurgitation, with posteriorly-directed jet. No evidence of mitral valve stenosis. Tricuspid Valve: The tricuspid valve is normal in structure. Tricuspid valve regurgitation is mild . No evidence of tricuspid stenosis. Aortic Valve: The aortic valve is tricuspid. There is mild calcification of the aortic valve. Aortic valve regurgitation is not visualized. No aortic stenosis is present. Pulmonic Valve: The pulmonic valve was normal in structure. Pulmonic valve regurgitation is trivial. No evidence of pulmonic stenosis. Aorta: Aortic dilatation noted. There is mild dilatation of the ascending aorta, measuring 37 mm. Venous: The inferior vena cava is dilated in size with greater than 50% respiratory variability, suggesting right atrial pressure of 8 mmHg. IAS/Shunts: No atrial level shunt detected by color flow Doppler.  LEFT VENTRICLE PLAX 2D LVIDd:         5.10 cm     Diastology LVIDs:         4.10 cm     LV e' medial:    5.36 cm/s LV PW:         1.20 cm     LV E/e' medial:  14.3 LV IVS:        1.00 cm     LV e' lateral:   6.41 cm/s LVOT diam:     2.10 cm     LV E/e' lateral: 12.0 LV SV:         62 LV SV Index:   33 LVOT Area:     3.46  cm                             3D Volume EF: LV Volumes (MOD)           3D EF:        39 % LV vol d, MOD A2C: 86.2 ml LV EDV:       135 ml LV vol d, MOD A4C: 90.6 ml LV ESV:       83 ml LV vol s, MOD A2C: 54.0 ml LV SV:        52 ml LV vol s, MOD A4C: 61.6 ml LV SV MOD A2C:     32.2 ml LV SV MOD A4C:     90.6 ml LV SV MOD BP:      30.7 ml RIGHT VENTRICLE RV S prime:     11.50 cm/s TAPSE (M-mode): 2.1 cm LEFT ATRIUM               Index        RIGHT ATRIUM           Index LA diam:        3.50 cm 1.88 cm/m   RA Area:     19.30 cm LA Vol (A2C):   64.0 ml 34.36 ml/m  RA Volume:   55.90 ml  30.01 ml/m LA Vol (A4C):   53.3 ml 28.61 ml/m LA Biplane Vol: 58.9 ml 31.62 ml/m  AORTIC VALVE LVOT Vmax:   106.00 cm/s LVOT Vmean:  66.000 cm/s LVOT VTI:    0.180 m  AORTA Ao Asc diam: 3.70 cm MITRAL VALVE               TRICUSPID VALVE MV Area (PHT): 3.99 cm    TR Peak grad:   29.6 mmHg MV Decel Time: 190 msec    TR Vmax:        272.00 cm/s MV E velocity: 76.70 cm/s MV A velocity: 66.00 cm/s  SHUNTS MV E/A ratio:  1.16        Systemic VTI:  0.18 m                            Systemic Diam: 2.10 cm Skeet Latch MD Electronically signed by Skeet Latch MD Signature Date/Time: 03/19/2022/2:21:27 PM    Final    CT Angio Chest Pulmonary Embolism (PE) W or WO Contrast  Result Date: 03/18/2022 CLINICAL DATA:  High clinical suspicion for PE EXAM: CT ANGIOGRAPHY CHEST WITH CONTRAST TECHNIQUE: Multidetector CT imaging of the chest was performed using the standard protocol during bolus administration of intravenous contrast. Multiplanar CT image reconstructions and MIPs were obtained to evaluate the vascular anatomy. RADIATION DOSE REDUCTION: This exam was performed according to the departmental dose-optimization program which includes automated exposure control, adjustment of the mA and/or kV according to patient size and/or use of iterative reconstruction technique. CONTRAST:  78mL OMNIPAQUE IOHEXOL 350 MG/ML SOLN COMPARISON:   Previous CT done on 09/03/2021, chest radiograph done on 03/17/2022 FINDINGS: Cardiovascular: There are scattered coronary artery calcifications. Pacemaker battery is seen in the left infraclavicular region. Contrast density in the lumen of thoracic aorta is less than adequate to evaluate for dissection. There is ectasia of main pulmonary artery measuring 3.8 cm suggesting pulmonary arterial hypertension. There are no intraluminal filling defects in central pulmonary artery branches. Motion artifacts limit evaluation of small peripheral branches. Mediastinum/Nodes: No significant lymphadenopathy seen. Lungs/Pleura: Centrilobular emphysema is seen. Small linear densities are seen in lower lung fields suggesting scarring or subsegmental atelectasis. There is no focal pulmonary consolidation. There is no pleural effusion or pneumothorax. There is narrowing of AP diameter of trachea and mainstem bronchi suggesting possible bronchospasm. Upper Abdomen: No acute findings are seen. Musculoskeletal: Degenerative changes are noted in both shoulders. Degenerative changes are noted in lower thoracic spine. Review of the MIP images confirms the above findings. IMPRESSION: There is no evidence of pulmonary artery embolism. There is ectasia of main pulmonary artery suggesting pulmonary arterial hypertension. Coronary artery calcifications are seen. Centrilobular emphysema. Linear densities in the lower lung fields suggest scarring or subsegmental atelectasis. Electronically Signed   By: Elmer Picker M.D.   On: 03/18/2022 13:09    Cardiac Studies   Echo: 03/19/22  IMPRESSIONS     1. Left ventricular ejection fraction, by estimation, is 35 to 40%. The  left ventricle has moderately decreased function. The left ventricle  demonstrates regional wall motion abnormalities (see  scoring  diagram/findings for description). Left ventricular   diastolic parameters are consistent with Grade I diastolic dysfunction   (impaired relaxation). Elevated left ventricular end-diastolic pressure.   2. Right ventricular systolic function is normal. The right ventricular  size is normal. There is mildly elevated pulmonary artery systolic  pressure.   3. Right atrial size was mildly dilated.   4. The mitral valve is normal in structure. Mild mitral valve  regurgitation. No evidence of mitral stenosis.   5. The aortic valve is tricuspid. There is mild calcification of the  aortic valve. Aortic valve regurgitation is not visualized. No aortic  stenosis is present.   6. Aortic dilatation noted. There is mild dilatation of the ascending  aorta, measuring 37 mm.   7. The inferior vena cava is dilated in size with >50% respiratory  variability, suggesting right atrial pressure of 8 mmHg.   FINDINGS   Left Ventricle: Left ventricular ejection fraction, by estimation, is 35  to 40%. The left ventricle has moderately decreased function. The left  ventricle demonstrates regional wall motion abnormalities. Definity  contrast agent was given IV to delineate  the left ventricular endocardial borders. The left ventricular internal  cavity size was normal in size. There is no left ventricular hypertrophy.  Left ventricular diastolic parameters are consistent with Grade I  diastolic dysfunction (impaired  relaxation). Elevated left ventricular end-diastolic pressure.      LV Wall Scoring:  The entire septum, entire inferior wall, and apex are hypokinetic. The  entire  anterior wall and entire lateral wall are normal.   Right Ventricle: The right ventricular size is normal. No increase in  right ventricular wall thickness. Right ventricular systolic function is  normal. There is mildly elevated pulmonary artery systolic pressure. The  tricuspid regurgitant velocity is 2.72   m/s, and with an assumed right atrial pressure of 8 mmHg, the estimated  right ventricular systolic pressure is 123456 mmHg.   Left Atrium: Left  atrial size was normal in size.   Right Atrium: Right atrial size was mildly dilated.   Pericardium: There is no evidence of pericardial effusion.   Mitral Valve: The mitral valve is normal in structure. Mild mitral valve  regurgitation, with posteriorly-directed jet. No evidence of mitral valve  stenosis.   Tricuspid Valve: The tricuspid valve is normal in structure. Tricuspid  valve regurgitation is mild . No evidence of tricuspid stenosis.   Aortic Valve: The aortic valve is tricuspid. There is mild calcification  of the aortic valve. Aortic valve regurgitation is not visualized. No  aortic stenosis is present.   Pulmonic Valve: The pulmonic valve was normal in structure. Pulmonic valve  regurgitation is trivial. No evidence of pulmonic stenosis.   Aorta: Aortic dilatation noted. There is mild dilatation of the ascending  aorta, measuring 37 mm.   Venous: The inferior vena cava is dilated in size with greater than 50%  respiratory variability, suggesting right atrial pressure of 8 mmHg.   IAS/Shunts: No atrial level shunt detected by color flow Doppler.   Patient Profile     76 y.o. 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 Cyndia Skeeters.    Assessment & Plan    Elevated Troponin -- in the setting of acute illness, no anginal symptoms. hsTn peaked  at 2438. EKG sinus with V-pacing. Unclear etiology of elevated troponin, could be demand but echo yesterday with drop in LVEF 35-40%.  -- Now planned for [facility] for ischemia work up    Sepsis Suspected PNA BOOP Acute on chronic hypoxia -- on chronic 4L [place], follows with pulmonary as an outpatient -- respiratory panel negative. BC pending. CT negative for PE suggestion of pulmonary arterial  hypertension, emphysema with lower lung field suggesting scarring versus subsegmental atelectasis -- antibiotics per primary team -- increased O2 requirements, planned for [facility] to further define etiology   Persistent Afib S/p AVN ablation with PPM '17 -- maintaining sinus rhythm -- continue Tikosyn (QTc 0.41 this morning on telemetry) -- Xarelto held with need for cath, on IV heparin    HFrEF (EF 35-40%) -- last echo at [facility] with preserved LV function, does not appear volume overloaded on exam -- repeat echo showed drop in LVEF to 35-40%, g1DD, normal RV size and function, mildly dilated RA, elevated LVEDP with hypokinetic septum and inferior wall.  -- GDMT has been limited in the past, unable to tolerate Entresto due to low BP and no SGLT2i with UTIs   Hypotension -- likely from hypovolemia/sepsis -- home BP meds held presently   Hypokalemia -- resolved    For questions or updates, please contact [place] Please consult [email or web address] for contact info under        Signed, Reino Bellis, NP  03/20/2022, 8:35 AM

## 2022-03-20 NOTE — Progress Notes (Signed)
PROGRESS NOTE  Kathleen [name] [id number] DOB: 02-Jun-1946   PCP: [name], MD  Patient is from: ILF.  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  In ED, febrile to 103.  WBC 9.8.  CXR with bilateral lower lobe opacity but mostly chronic.  CT head without acute finding.  COVID-19 and influenza PCR nonreactive.  Cultures drawn.  She was started on broad-spectrum antibiotics, and admitted.  Patient's troponin trended from 03-2499 overnight.  She had no chest pain or acute ischemic finding on EKG.  Cardiology consulted. TTE with LVEF of 35 to 40%, RWMA, G1-DD.  Patient was started on IV heparin.  Plan for [facility] on 10/13.  Antibiotic de-escalated to CTX and doxycycline for possible pneumonia.   Subjective: Seen and examined earlier this morning.  No major events overnight of this morning.  Had episode of dyspnea and cough overnight that has improved with breathing treatment.  Denies chest pain.  Denies GI or UTI symptoms.  Aware of the plan about her catheterization today.  Objective: Vitals:   03/20/22 0614 03/20/22 0815 03/20/22 0836 03/20/22 1106  BP:  (!) 100/55  (!) 106/53  Pulse:  75 74 82  Resp:  20 18 17   Temp:  97.9 F (36.6 C)  98.1 F (36.7 C)  TempSrc:  Oral  Oral  SpO2:  98% 97% 97%  Weight: 87.9 kg     Height:        Examination:  GENERAL: No apparent distress.  Nontoxic. HEENT: MMM.  Vision and hearing grossly intact.  NECK: Supple.  No apparent JVD.  RESP:  No IWOB.  Fair aeration bilaterally.  Bibasilar crackles more on the left. CVS:  RRR. Heart sounds normal.  ABD/GI/GU: BS+. Abd soft, NTND.  MSK/EXT:   Moves extremities. No apparent deformity.  Trace BLE edema. SKIN: no apparent skin lesion or wound NEURO: Awake and alert. Oriented appropriately.  No apparent focal neuro deficit. PSYCH: Calm. Normal affect.   Procedures:  None  Microbiology summarized: [id number] and influenza PCR nonreactive. Full RVP negative. Blood cultures NGTD MRSA PCR screen negative.  Assessment and plan: Active Problems:   HTN (hypertension)   COPD (chronic obstructive pulmonary disease) (HCC)   Atrial fibrillation (HCC)   (HFpEF) heart failure with preserved ejection fraction (HCC)   Sleep apnea   Type 2 diabetes mellitus with diabetic neuropathy, without long-term current use of insulin (HCC)   Stage 3a chronic kidney disease (HCC)   Chronic anticoagulation   Post concussive syndrome   Severe sepsis (HCC)   Non-STEMI (non-ST elevated myocardial infarction) (HCC)   Hypotension   Morbid obesity (HCC)   Acute systolic CHF (congestive heart failure) ([place])  Severe sepsis likely due to pneumonia: POA.  Febrile to 103 with tachypnea.  She has dry hacking cough.  CXR raises concern for by basilar opacities but CTA chest negative for PE or pneumonia.  COVID-19, influenza PCR full RVP panel negative.  Blood cultures NGTD.  MRSA PCR negative.  No lactic acidosis.  Markedly elevated Pro-Cal but improving. -Continue CAP coverage with CTX and doxycycline  Non-STEMI: Troponin trended from 10>>> 2438>> 1900. TTE with LVEF of 35  to 40%, RWMA and G1 DD.  Remains chest pain-free but dry cough and DOE -Cardiology on board-started IV heparin.   -R/LHC today (10/13). -Hold Uloric.   Acute combined CHF: BNP elevated.  CT as above.  Appears euvolemic on exam.  She is on torsemide at home. -Cardiology on board -[facility] today -Strict intake and output.  Hypotension: Soft blood pressure -Continue holding antihypertensive meds and diuretics.  Flulike illness-due to flu vaccine?  Had weakness, confusion, fever, myalgia  after flu vaccine.  COVID-19, influenza and full RVP negative.  Symptoms seems to have resolved. -Supportive care  COPD with mild acute exacerbation/chronic respiratory failure with hypoxia/history of BOO: Still with shortness of breath, DOE and dry cough.  Lung exam with bibasilar crackles more on the left. -Start IV Solu-Medrol -Antibiotics as above -Breo Ellipta, Yupelri, as needed DuoNeb -Incentive spirometry -Antitussive and mucolytic's  Paroxysmal A-fib on Eliquis: In sinus rhythm. -Cardiology on board.  On home Tikosyn.  On heparin for anticoagulation.  Complete heart block s/p PPM -Per cardiology  Hypokalemia/hypophosphatemia -Monitor and replenish as appropriate  Generalized weakness -PT/OT eval  Morbid obesity: Elevated BMI with comorbidity as above. Body mass index is 36.62 kg/m.           DVT prophylaxis:    Code Status: Full code Family Communication: Updated patient's son from patient's phone at bedside Level of care: Telemetry Medical Status is: Inpatient Remains inpatient appropriate because: Severe sepsis, non-STEMI, acute systolic CHF and COPD exacerbation   Final disposition: Remains inpatient Consultants:  Cardiology  Sch Meds:  Scheduled Meds:  amitriptyline  25 mg Oral QHS   azelastine  2 spray Each Nare BID   cholecalciferol  2,000 Units Oral QHS   dofetilide  250 mcg Oral BID   doxycycline  100 mg Oral Q12H   fluticasone furoate-vilanterol  1 puff Inhalation Daily   magnesium oxide  400 mg Oral QHS   methylPREDNISolone (SOLU-MEDROL) injection  40 mg Intravenous Q12H   [START ON 03/21/2022] pantoprazole  40 mg Oral Daily   revefenacin  175 mcg Nebulization Daily   sodium chloride flush  3 mL Intravenous Q12H   Continuous Infusions:  sodium chloride     sodium chloride 50 mL/hr at 03/20/22 0840   cefTRIAXone (ROCEPHIN)  IV 1 g (03/19/22 1549)   heparin 1,500 Units/hr (03/20/22 0335)   PRN Meds:.sodium chloride, acetaminophen  **OR** acetaminophen, guaiFENesin-dextromethorphan, ipratropium-albuterol, sodium chloride flush  Antimicrobials: Anti-infectives (From admission, onward)    Start     Dose/Rate Route Frequency Ordered Stop   03/19/22 1500  cefTRIAXone (ROCEPHIN) 1 g in sodium chloride 0.9 % 100 mL IVPB        1 g 200 mL/hr over 30 Minutes Intravenous Every 24 hours 03/19/22 1400 03/23/22 1459   03/19/22 1400  ceFEPIme (MAXIPIME) 2 g in sodium chloride 0.9 % 100 mL IVPB  Status:  Discontinued        2 g 200 mL/hr over 30 Minutes Intravenous Every 8 hours 03/19/22 0916 03/19/22 1400   03/19/22 1000  vancomycin (VANCOREADY) IVPB 1250 mg/250 mL  Status:  Discontinued        1,250 mg 166.7 mL/hr over 90 Minutes Intravenous Every 36 hours 03/18/22 1257 03/19/22 1331   03/19/22 1000  doxycycline (VIBRA-TABS) tablet 100 mg        100 mg Oral Every 12 hours 03/19/22 0726     03/19/22 0900  vancomycin (VANCOREADY) IVPB 1250 mg/250 mL  Status:  Discontinued  1,250 mg 166.7 mL/hr over 90 Minutes Intravenous Every 36 hours 03/17/22 2311 03/18/22 0027   03/19/22 0800  azithromycin (ZITHROMAX) 500 mg in sodium chloride 0.9 % 250 mL IVPB  Status:  Discontinued        500 mg 250 mL/hr over 60 Minutes Intravenous Every 24 hours 03/19/22 0714 03/19/22 0725   03/18/22 1315  ceFEPIme (MAXIPIME) 2 g in sodium chloride 0.9 % 100 mL IVPB  Status:  Discontinued        2 g 200 mL/hr over 30 Minutes Intravenous Every 12 hours 03/18/22 1257 03/19/22 0916   03/18/22 0730  ceFEPIme (MAXIPIME) 2 g in sodium chloride 0.9 % 100 mL IVPB  Status:  Discontinued        2 g 200 mL/hr over 30 Minutes Intravenous Every 12 hours 03/17/22 2311 03/18/22 0027   03/18/22 0000  cefTRIAXone (ROCEPHIN) 2 g in sodium chloride 0.9 % 100 mL IVPB  Status:  Discontinued        2 g 200 mL/hr over 30 Minutes Intravenous Every 24 hours 03/17/22 2356 03/18/22 1257   03/17/22 1915  vancomycin (VANCOREADY) IVPB 2000 mg/400 mL        2,000 mg 200  mL/hr over 120 Minutes Intravenous  Once 03/17/22 1901 03/18/22 0109   03/17/22 1915  ceFEPIme (MAXIPIME) 2 g in sodium chloride 0.9 % 100 mL IVPB        2 g 200 mL/hr over 30 Minutes Intravenous  Once 03/17/22 1901 03/17/22 2034        I have personally reviewed the following labs and images: CBC: Recent Labs  Lab 03/17/22 1847 03/19/22 0027 03/20/22 0202  WBC 9.8 7.3 6.8  NEUTROABS 9.0*  --   --   HGB 11.8* 10.1* 10.4*  HCT 36.8 29.8* 31.7*  MCV 86.2 82.1 83.6  PLT 195 164 177   BMP &GFR Recent Labs  Lab 03/17/22 1847 03/18/22 0549 03/19/22 0026 03/19/22 0027 03/20/22 0202  NA 134*  --  132*  --  137  K 4.1  --  3.2*  --  4.4  CL 102  --  102  --  107  CO2 21*  --  22  --  21*  GLUCOSE 147*  --  163*  --  128*  BUN 8  --  9  --  7*  CREATININE 0.83 0.85 0.75  --  0.61  CALCIUM 9.0  --  8.4*  --  8.6*  MG  --  1.9  --  2.0 2.0  PHOS  --   --  1.2*  --   --    Estimated Creatinine Clearance: 61.2 mL/min (by C-G formula based on SCr of 0.61 mg/dL). Liver & Pancreas: Recent Labs  Lab 03/17/22 1847 03/19/22 0026  AST 15  --   ALT 11  --   ALKPHOS 31*  --   BILITOT 0.8  --   PROT 6.3*  --   ALBUMIN 3.4* 2.7*   No results for input(s): "LIPASE", "AMYLASE" in the last 168 hours. No results for input(s): "AMMONIA" in the last 168 hours. Diabetic: No results for input(s): "HGBA1C" in the last 72 hours. Recent Labs  Lab 03/18/22 0832 03/19/22 0626 03/20/22 0620  GLUCAP 137* 143* 148*   Cardiac Enzymes: No results for input(s): "CKTOTAL", "CKMB", "CKMBINDEX", "TROPONINI" in the last 168 hours. No results for input(s): "PROBNP" in the last 8760 hours. Coagulation Profile: No results for input(s): "INR", "PROTIME" in the last 168 hours. Thyroid  Function Tests: No results for input(s): "TSH", "T4TOTAL", "FREET4", "T3FREE", "THYROIDAB" in the last 72 hours. Lipid Profile: No results for input(s): "CHOL", "HDL", "LDLCALC", "TRIG", "CHOLHDL", "LDLDIRECT" in  the last 72 hours. Anemia Panel: No results for input(s): "VITAMINB12", "FOLATE", "FERRITIN", "TIBC", "IRON", "RETICCTPCT" in the last 72 hours. Urine analysis:    Component Value Date/Time   COLORURINE YELLOW 03/17/2022 2244   APPEARANCEUR HAZY (A) 03/17/2022 2244   APPEARANCEUR Hazy 12/25/2012 1653   LABSPEC 1.012 03/17/2022 2244   LABSPEC 1.021 12/25/2012 1653   PHURINE 7.0 03/17/2022 2244   GLUCOSEU NEGATIVE 03/17/2022 2244   GLUCOSEU NEGATIVE 08/07/2021 1219   HGBUR NEGATIVE 03/17/2022 2244   BILIRUBINUR NEGATIVE 03/17/2022 2244   BILIRUBINUR Negative 12/25/2012 [place] 03/17/2022 2244   PROTEINUR NEGATIVE 03/17/2022 2244   UROBILINOGEN 1.0 08/07/2021 1219   NITRITE NEGATIVE 03/17/2022 2244   LEUKOCYTESUR LARGE (A) 03/17/2022 2244   LEUKOCYTESUR Negative 12/25/2012 1653   Sepsis Labs: Invalid input(s): "PROCALCITONIN", "LACTICIDVEN"  Microbiology: Recent Results (from the past 240 hour(s))  Culture, blood (routine x 2)     Status: None (Preliminary result)   Collection Time: 03/17/22  6:47 PM   Specimen: BLOOD  Result Value Ref Range Status   Specimen Description BLOOD BLOOD LEFT FOREARM  Final   Special Requests   Final    BOTTLES DRAWN AEROBIC AND ANAEROBIC Blood Culture results may not be optimal due to an excessive volume of blood received in culture bottles   Culture   Final    NO GROWTH 2 DAYS Performed at [facility], [place] [address] [place], [place] [address]    Report Status PENDING  Incomplete  Resp Panel by RT-PCR (Flu A&B, Covid)     Status: None   Collection Time: 03/17/22  6:47 PM   Specimen: Nasal Swab  Result Value Ref Range Status   SARS Coronavirus 2 by RT PCR NEGATIVE NEGATIVE Final    Comment: (NOTE) SARS-CoV-2 target nucleic acids are NOT DETECTED.  The SARS-CoV-2 RNA is generally detectable in upper respiratory specimens during the acute phase of infection. The lowest concentration of SARS-CoV-2 viral copies this  assay can detect is 138 copies/mL. A negative result does not preclude SARS-Cov-2 infection and should not be used as the sole basis for treatment or other patient management decisions. A negative result may occur with  improper specimen collection/handling, submission of specimen other than nasopharyngeal swab, presence of viral mutation(s) within the areas targeted by this assay, and inadequate number of viral copies(<138 copies/mL). A negative result must be combined with clinical observations, patient history, and epidemiological information. The expected result is Negative.  Fact Sheet for Patients:  EntrepreneurPulse.com.au  Fact Sheet for Healthcare Providers:  IncredibleEmployment.be  This test is no t yet approved or cleared by the [place] FDA and  has been authorized for detection and/or diagnosis of SARS-CoV-2 by FDA under an Emergency Use Authorization (EUA). This EUA will remain  in effect (meaning this test can be used) for the duration of the COVID-19 declaration under Section 564(b)(1) of the Act, 21 U.S.C.section 360bbb-3(b)(1), unless the authorization is terminated  or revoked sooner.       Influenza A by PCR NEGATIVE NEGATIVE Final   Influenza B by PCR NEGATIVE NEGATIVE Final    Comment: (NOTE) The Xpert Xpress SARS-CoV-2/FLU/RSV plus assay is intended as an aid in the diagnosis of influenza from Nasopharyngeal swab specimens and should not be used as a sole basis for treatment.  Nasal washings and aspirates are unacceptable for Xpert Xpress SARS-CoV-2/FLU/RSV testing.  Fact Sheet for Patients: EntrepreneurPulse.com.au  Fact Sheet for Healthcare Providers: IncredibleEmployment.be  This test is not yet approved or cleared by the [place] FDA and has been authorized for detection and/or diagnosis of SARS-CoV-2 by FDA under an Emergency Use Authorization (EUA). This EUA will  remain in effect (meaning this test can be used) for the duration of the COVID-19 declaration under Section 564(b)(1) of the Act, 21 U.S.C. section 360bbb-3(b)(1), unless the authorization is terminated or revoked.  Performed at [facility], [place] [address] [place], [place] [address]   Culture, blood (routine x 2)     Status: None (Preliminary result)   Collection Time: 03/17/22  7:55 PM   Specimen: BLOOD  Result Value Ref Range Status   Specimen Description BLOOD BLOOD RIGHT HAND  Final   Special Requests   Final    BOTTLES DRAWN AEROBIC AND ANAEROBIC Blood Culture adequate volume   Culture   Final    NO GROWTH 2 DAYS Performed at [facility], [place] [address], [place], [place] [address]    Report Status PENDING  Incomplete  Respiratory (~20 pathogens) panel by PCR     Status: None   Collection Time: 03/17/22  9:23 PM   Specimen: Respiratory  Result Value Ref Range Status   Adenovirus NOT DETECTED NOT DETECTED Final   Coronavirus 229E NOT DETECTED NOT DETECTED Final    Comment: (NOTE) The Coronavirus on the Respiratory Panel, DOES NOT test for the novel  Coronavirus (2019 nCoV)    Coronavirus HKU1 NOT DETECTED NOT DETECTED Final   Coronavirus NL63 NOT DETECTED NOT DETECTED Final   Coronavirus OC43 NOT DETECTED NOT DETECTED Final   Metapneumovirus NOT DETECTED NOT DETECTED Final   Rhinovirus / Enterovirus NOT DETECTED NOT DETECTED Final   Influenza A NOT DETECTED NOT DETECTED Final   Influenza B NOT DETECTED NOT DETECTED Final   Parainfluenza Virus 1 NOT DETECTED NOT DETECTED Final   Parainfluenza Virus 2 NOT DETECTED NOT DETECTED Final   Parainfluenza Virus 3 NOT DETECTED NOT DETECTED Final   Parainfluenza Virus 4 NOT DETECTED NOT DETECTED Final   Respiratory Syncytial Virus NOT DETECTED NOT DETECTED Final   Bordetella pertussis NOT DETECTED NOT DETECTED Final   Bordetella Parapertussis NOT DETECTED NOT DETECTED Final   Chlamydophila pneumoniae NOT DETECTED  NOT DETECTED Final   Mycoplasma pneumoniae NOT DETECTED NOT DETECTED Final    Comment: Performed at [facility], [place]. [address], [place], [place] [address]  MRSA Next Gen by PCR, Nasal     Status: None   Collection Time: 03/19/22 12:03 AM   Specimen: Nasal Mucosa; Nasal Swab  Result Value Ref Range Status   MRSA by PCR Next Gen NOT DETECTED NOT DETECTED Final    Comment: (NOTE) The GeneXpert MRSA Assay (FDA approved for NASAL specimens only), is one component of a comprehensive MRSA colonization surveillance program. It is not intended to diagnose MRSA infection nor to guide or monitor treatment for MRSA infections. Test performance is not FDA approved in patients less than 13 years old. Performed at [facility], [place] [address] [place], [place] [address]     Radiology Studies: No results found.    Jasreet Dickie T. [place]  If 7PM-7AM, please contact night-coverage [email or web address] 03/20/2022, 11:45 AM

## 2022-03-20 NOTE — Progress Notes (Signed)
ANTICOAGULATION CONSULT NOTE  Pharmacy Consult for heparin Indication: atrial fibrillation  Allergies  Allergen Reactions   Allopurinol Other (See Comments)    Reaction:  Dizziness    Clindamycin Anaphylaxis and Hives   Pneumococcal 13-Val Conj Vacc Itching, Swelling and Rash   Dronedarone Rash   Brovana [Arformoterol]     Caused muscle pain   Budesonide     Caused extreme joint pain   Entresto [Sacubitril-Valsartan] Other (See Comments)    hypotension   Fosamax [Alendronate Sodium] Nausea Only   Jardiance [Empagliflozin]     Caused a vaginal infection   Meperidine Nausea And Vomiting   Microplegia Msa-Msg [Plegisol]     Loopy,diarrhea   Rosuvastatin Other (See Comments)    Reaction:  Muscle spasms    Tetracycline Hives   Adhesive [Tape] Rash   Lovastatin Rash    Other reaction(s): Muscle Pain    Patient Measurements: Height: 5\' 1"  (154.9 cm) Weight: 87.2 kg (192 lb 4.8 oz) IBW/kg (Calculated) : 47.8 Heparin Dosing Weight: 87kg  Vital Signs: Temp: 99.3 F (37.4 C) (10/12 2311) Temp Source: Oral (10/12 2311) BP: 127/69 (10/12 2311) Pulse Rate: 84 (10/12 2311)  Labs: Recent Labs    03/17/22 1847 03/18/22 0549 03/18/22 0846 03/18/22 1146 03/19/22 0026 03/19/22 0027 03/20/22 0202  HGB 11.8*  --   --   --   --  10.1* 10.4*  HCT 36.8  --   --   --   --  29.8* 31.7*  PLT 195  --   --   --   --  164 177  APTT  --   --   --   --   --   --  39*  HEPARINUNFRC  --   --   --   --   --   --  0.21*  CREATININE 0.83 0.85  --   --  0.75  --   --   TROPONINIHS 10 2,438* 1,869* 1,658*  --   --   --      Estimated Creatinine Clearance: 61 mL/min (by C-G formula based on SCr of 0.75 mg/dL).   Medical History: 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[place])    CHF (congestive heart failure) ([place])    Chronic renal insufficiency    Complete heart block ([place]) s/p AV  nodal ablation    Concussion 10/04/2021   COPD (chronic obstructive pulmonary disease) (HCC)    Gout    Hypertension    Longstanding persistent atrial fibrillation (HCC)    Nonischemic cardiomyopathy ([place])    Obesity    Pacemaker    Spontaneous pneumothorax 2013     Assessment: 76 yoF on Xarelto PTA for hx AF admitted with elevated troponins and possible ACS. Pharmacy consulted to hold Xarelto and start IV heparin with need for [facility]. Xarelto last given 10/11 1730.  Heparin level subtherapeutic: 0.21 on 1250 units/hr; Hgb 10.4 but stable; no issues with infusion or overt s/sx of bleeding reported; will increase heparin gtt ~ 3 units/kg/hr  Goal of Therapy:  Heparin level 0.3-0.7 units/ml aPTT 66-102 seconds Monitor platelets by anticoagulation protocol: Yes   Plan:  Increase heparin gtt to 1500 units/h Check aPTT and heparin level 8h after starting  Georga Bora, PharmD Clinical Pharmacist 03/20/2022 3:01 AM Please check AMION for all [place] numbers

## 2022-03-20 NOTE — Progress Notes (Signed)
Mobility Specialist Progress Note    03/20/22 1149  Mobility  Activity Ambulated with assistance in hallway  Level of Assistance Contact guard assist, steadying assist  Assistive Device Front wheel walker  Distance Ambulated (ft) 30 ft  Activity Response Tolerated fair  Mobility Referral Yes  $Mobility charge 1 Mobility   Pre-Mobility: 83 HR, 100% SpO2 Post-Mobility: 80 HR, 98% SpO2  Pt received in bed and agreeable. No complaints on walk. On 6LO2 during. Returned to sitting EOB with call bell in reach. On 5LO2 in room.     Hildred Alamin Mobility Specialist  Secure Chat Only

## 2022-03-20 NOTE — Interval H&P Note (Signed)
History and Physical Interval Note:  03/20/2022 12:18 PM  Kathleen [name]  has presented today for surgery, with the diagnosis of nstemi.  The various methods of treatment have been discussed with the patient and family. After consideration of risks, benefits and other options for treatment, the patient has consented to  Procedure(s): RIGHT/LEFT HEART CATH AND CORONARY ANGIOGRAPHY (N/A) as a surgical intervention.  The patient's history has been reviewed, patient examined, no change in status, stable for surgery.  I have reviewed the patient's chart and labs.  Questions were answered to the patient's satisfaction.   Cath Lab Visit (complete for each Cath Lab visit)  Clinical Evaluation Leading to the Procedure:   ACS: Yes.    Non-ACS:    Anginal Classification: CCS IV  Anti-ischemic medical therapy: Minimal Therapy (1 class of medications)  Non-Invasive Test Results: No non-invasive testing performed  Prior CABG: No previous CABG        Kathleen [name] [facility] 03/20/2022 12:18 PM

## 2022-03-20 NOTE — Progress Notes (Signed)
Site area: right groin  Site Prior to Removal:  Level 0  Pressure Applied For 20 MINUTES    Minutes Beginning at 1430  Manual:   Yes.    Patient Status During Pull:  Stable  Post Pull Groin Site:  Level 0  Post Pull Instructions Given:  Yes.    Post Pull Pulses Present:  Yes.    Dressing Applied:  Yes.    Comments:  Bed rest started at 1450 X 4  hr.

## 2022-03-21 DIAGNOSIS — A419 Sepsis, unspecified organism: Secondary | ICD-10-CM | POA: Diagnosis not present

## 2022-03-21 DIAGNOSIS — G4730 Sleep apnea, unspecified: Secondary | ICD-10-CM | POA: Diagnosis not present

## 2022-03-21 DIAGNOSIS — E1140 Type 2 diabetes mellitus with diabetic neuropathy, unspecified: Secondary | ICD-10-CM | POA: Diagnosis not present

## 2022-03-21 DIAGNOSIS — N1831 Chronic kidney disease, stage 3a: Secondary | ICD-10-CM | POA: Diagnosis not present

## 2022-03-21 LAB — RENAL FUNCTION PANEL
Albumin: 3 g/dL — ABNORMAL LOW (ref 3.5–5.0)
Anion gap: 7 (ref 5–15)
BUN: 8 mg/dL (ref 8–23)
CO2: 21 mmol/L — ABNORMAL LOW (ref 22–32)
Calcium: 8.6 mg/dL — ABNORMAL LOW (ref 8.9–10.3)
Chloride: 108 mmol/L (ref 98–111)
Creatinine, Ser: 0.67 mg/dL (ref 0.44–1.00)
GFR, Estimated: >60 mL/min (ref >60)
Glucose, Bld: 170 mg/dL — ABNORMAL HIGH (ref 70–99)
Phosphorus: 1.6 mg/dL — ABNORMAL LOW (ref 2.5–4.6)
Potassium: 4.5 mmol/L (ref 3.5–5.1)
Sodium: 136 mmol/L (ref 135–145)

## 2022-03-21 LAB — CBC
HCT: 33.2 % — ABNORMAL LOW (ref 36.0–46.0)
Hemoglobin: 11 g/dL — ABNORMAL LOW (ref 12.0–15.0)
MCH: 27.4 pg (ref 26.0–34.0)
MCHC: 33.1 g/dL (ref 30.0–36.0)
MCV: 82.8 fL (ref 80.0–100.0)
Platelets: 186 10*3/uL (ref 150–400)
RBC: 4.01 MIL/uL (ref 3.87–5.11)
RDW: 14.1 % (ref 11.5–15.5)
WBC: 4.7 10*3/uL (ref 4.0–10.5)
nRBC: 0 % (ref 0.0–0.2)

## 2022-03-21 LAB — GLUCOSE, CAPILLARY
Glucose-Capillary: 184 mg/dL — ABNORMAL HIGH (ref 70–99)
Glucose-Capillary: 122 mg/dL — ABNORMAL HIGH (ref 70–99)
Glucose-Capillary: 194 mg/dL — ABNORMAL HIGH (ref 70–99)

## 2022-03-21 LAB — MAGNESIUM: Magnesium: 2.1 mg/dL (ref 1.7–2.4)

## 2022-03-21 MED ORDER — LEVALBUTEROL HCL 0.63 MG/3ML IN NEBU
0.6300 mg | INHALATION_SOLUTION | RESPIRATORY_TRACT | Status: DC | PRN
  Administered 2022-03-21: 0.63 mg via RESPIRATORY_TRACT
  Filled 2022-03-21: qty 3

## 2022-03-21 MED ORDER — FUROSEMIDE 40 MG PO TABS
40.0000 mg | ORAL_TABLET | Freq: Once | ORAL | Status: AC
  Administered 2022-03-21: 40 mg via ORAL
  Filled 2022-03-21: qty 1

## 2022-03-21 MED ORDER — IPRATROPIUM BROMIDE 0.02 % IN SOLN: 0.5000 mg | RESPIRATORY_TRACT | Status: DC | PRN

## 2022-03-21 MED ORDER — IPRATROPIUM BROMIDE 0.02 % IN SOLN
0.5000 mg | Freq: Three times a day (TID) | RESPIRATORY_TRACT | Status: DC
  Administered 2022-03-22: 0.5 mg via RESPIRATORY_TRACT
  Filled 2022-03-21: qty 2.5

## 2022-03-21 MED ORDER — LEVALBUTEROL HCL 0.63 MG/3ML IN NEBU
0.6300 mg | INHALATION_SOLUTION | Freq: Three times a day (TID) | RESPIRATORY_TRACT | Status: DC
  Administered 2022-03-22: 0.63 mg via RESPIRATORY_TRACT
  Filled 2022-03-21: qty 3

## 2022-03-21 MED ORDER — BENZONATATE 100 MG PO CAPS
100.0000 mg | ORAL_CAPSULE | Freq: Three times a day (TID) | ORAL | Status: DC
  Administered 2022-03-21 – 2022-03-22 (×4): 100 mg via ORAL
  Filled 2022-03-21 (×4): qty 1

## 2022-03-21 MED ORDER — IPRATROPIUM BROMIDE 0.02 % IN SOLN
0.5000 mg | Freq: Three times a day (TID) | RESPIRATORY_TRACT | Status: DC
  Administered 2022-03-21 (×2): 0.5 mg via RESPIRATORY_TRACT
  Filled 2022-03-21 (×2): qty 2.5

## 2022-03-21 MED ORDER — SODIUM PHOSPHATES 45 MMOLE/15ML IV SOLN
30.0000 mmol | Freq: Once | INTRAVENOUS | Status: AC
  Administered 2022-03-21: 30 mmol via INTRAVENOUS
  Filled 2022-03-21: qty 10

## 2022-03-21 MED ORDER — GUAIFENESIN ER 600 MG PO TB12
600.0000 mg | ORAL_TABLET | Freq: Two times a day (BID) | ORAL | Status: DC
  Administered 2022-03-21 – 2022-03-22 (×3): 600 mg via ORAL
  Filled 2022-03-21 (×3): qty 1

## 2022-03-21 MED ORDER — LEVALBUTEROL HCL 0.63 MG/3ML IN NEBU
0.6300 mg | INHALATION_SOLUTION | Freq: Three times a day (TID) | RESPIRATORY_TRACT | Status: DC
  Administered 2022-03-21 (×2): 0.63 mg via RESPIRATORY_TRACT
  Filled 2022-03-21 (×2): qty 3

## 2022-03-21 NOTE — Progress Notes (Signed)
Rounding Note    Patient Name: Kathleen [name] Date of Encounter: 03/21/2022  [facility] Cardiologist: None [facility] - Dr. Wyonia Hough. Haroldine Laws AHF  Subjective   S/p R/LHC with minor CAD and mild PHTN.  Inpatient Medications    Scheduled Meds:  amitriptyline  25 mg Oral QHS   azelastine  2 spray Each Nare BID   cholecalciferol  2,000 Units Oral QHS   dofetilide  250 mcg Oral BID   doxycycline  100 mg Oral Q12H   fluticasone furoate-vilanterol  1 puff Inhalation Daily   magnesium oxide  400 mg Oral QHS   methylPREDNISolone (SOLU-MEDROL) injection  40 mg Intravenous BID   pantoprazole  40 mg Oral Daily   revefenacin  175 mcg Nebulization Daily   rivaroxaban  20 mg Oral Daily   sodium chloride flush  3 mL Intravenous Q12H   sodium chloride flush  3 mL Intravenous Q12H   Continuous Infusions:  sodium chloride     cefTRIAXone (ROCEPHIN)  IV 200 mL/hr at 03/21/22 0605   sodium phosphate 30 mmol in dextrose 5 % 250 mL infusion     PRN Meds: sodium chloride, acetaminophen **OR** acetaminophen, guaiFENesin-dextromethorphan, ipratropium-albuterol, sodium chloride flush   Vital Signs    Vitals:   03/20/22 2347 03/21/22 0355 03/21/22 0500 03/21/22 0727  BP: 119/69 126/65  101/84  Pulse: 73 69  76  Resp: 20 19  18   Temp: 97.9 F (36.6 C) 98.2 F (36.8 C)  98.2 F (36.8 C)  TempSrc: Oral Oral  Oral  SpO2: 98% 100%  100%  Weight:   87.8 kg   Height:        Intake/Output Summary (Last 24 hours) at 03/21/2022 0827 Last data filed at 03/21/2022 0800 Gross per 24 hour  Intake 1346.85 ml  Output --  Net 1346.85 ml      03/21/2022    5:00 AM 03/20/2022    6:14 AM 03/19/2022   11:26 AM  Last 3 Weights  Weight (lbs) 193 lb 9 oz 193 lb 12.6 oz 192 lb 4.8 oz  Weight (kg) 87.8 kg 87.9 kg 87.227 kg      Telemetry    ASVP - Personally Reviewed  ECG    ASVP - Personally Reviewed  Physical Exam   GEN: No acute distress.   Neck: No JVD Cardiac: RRR, no  murmurs, rubs, or gallops.  Respiratory: fine rhonchi diffusely GI: Soft, nontender, non-distended  MS: No edema; No deformity. Neuro:  Nonfocal  Psych: Normal affect   Labs    High Sensitivity Troponin:   Recent Labs  Lab 03/17/22 1847 03/18/22 0549 03/18/22 0846 03/18/22 1146  TROPONINIHS 10 2,438* 1,869* 1,658*     Chemistry Recent Labs  Lab 03/17/22 1847 03/18/22 0549 03/19/22 0026 03/19/22 0027 03/20/22 0202 03/20/22 1309 03/20/22 1312 03/20/22 1316 03/21/22 0022  NA 134*  --  132*  --  137   < > 137 132* 136  K 4.1  --  3.2*  --  4.4   < > 4.3 4.0 4.5  CL 102  --  102  --  107  --   --   --  108  CO2 21*  --  22  --  21*  --   --   --  21*  GLUCOSE 147*  --  163*  --  128*  --   --   --  170*  BUN 8  --  9  --  7*  --   --   --  8  CREATININE 0.83   < > 0.75  --  0.61  --   --   --  0.67  CALCIUM 9.0  --  8.4*  --  8.6*  --   --   --  8.6*  MG  --    < >  --  2.0 2.0  --   --   --  2.1  PROT 6.3*  --   --   --   --   --   --   --   --   ALBUMIN 3.4*  --  2.7*  --   --   --   --   --  3.0*  AST 15  --   --   --   --   --   --   --   --   ALT 11  --   --   --   --   --   --   --   --   ALKPHOS 31*  --   --   --   --   --   --   --   --   BILITOT 0.8  --   --   --   --   --   --   --   --   GFRNONAA >60   < > >60  --  >60  --   --   --  >60  ANIONGAP 11  --  8  --  9  --   --   --  7   < > = values in this interval not displayed.    Lipids No results for input(s): "CHOL", "TRIG", "HDL", "LABVLDL", "LDLCALC", "CHOLHDL" in the last 168 hours.  Hematology Recent Labs  Lab 03/19/22 0027 03/20/22 0202 03/20/22 1309 03/20/22 1312 03/20/22 1316 03/21/22 0022  WBC 7.3 6.8  --   --   --  4.7  RBC 3.63* 3.79*  --   --   --  4.01  HGB 10.1* 10.4*   < > 9.5* 9.5* 11.0*  HCT 29.8* 31.7*   < > 28.0* 28.0* 33.2*  MCV 82.1 83.6  --   --   --  82.8  MCH 27.8 27.4  --   --   --  27.4  MCHC 33.9 32.8  --   --   --  33.1  RDW 14.3 14.2  --   --   --  14.1  PLT 164  177  --   --   --  186   < > = values in this interval not displayed.   Thyroid No results for input(s): "TSH", "FREET4" in the last 168 hours.  BNP Recent Labs  Lab 03/17/22 1841 03/18/22 0541 03/20/22 0202  BNP 195.9* 438.3* 305.9*    DDimer No results for input(s): "DDIMER" in the last 168 hours.   Radiology    CARDIAC CATHETERIZATION  Result Date: 03/20/2022   Prox LAD to Mid LAD lesion is 25% stenosed.   Prox Cx to Dist Cx lesion is 10% stenosed.   LV end diastolic pressure is normal.   Hemodynamic findings consistent with pulmonary hypertension. Minor nonobstructive CAD Normal LV filling pressures. PCWP 15/18 mm Hg with mean 13 mm Hg. LVEDP 14 mm Hg Mild pulmonary HTN. PAP 41/15, mean 28 mm Hg Preserved cardiac output 5.31 l/min with index 2.85. Plan: recommend continued medical therapy. Does not appear to be volume overloaded. Suspect troponin leak secondary to acute febrile  illness.  ECHOCARDIOGRAM COMPLETE  Result Date: 03/19/2022    ECHOCARDIOGRAM REPORT   Patient Name:   Kathleen [name] Date of Exam: 03/19/2022 Medical Rec #:  [id number]       Height:       61.0 in Accession #:    [id number]      Weight:       193.6 lb Date of Birth:  [birth year]       BSA:          1.863 m Patient Age:    59 years        BP:           106/46 mmHg Patient Gender: F               HR:           77 bpm. Exam Location:  Inpatient Procedure: 2D Echo, 3D Echo, Cardiac Doppler, Color Doppler and Intracardiac            Opacification Agent Indications:    NSTEMI I21.4  History:        Patient has no prior history of Echocardiogram examinations.                 Cardiomyopathy and CHF, Pacemaker, COPD, Arrythmias:Atrial                 Fibrillation and Atrial Flutter; Risk Factors:Hypertension.  Sonographer:    Bernadene Person RDCS Referring Phys: Wendee Beavers, T IMPRESSIONS  1. Left ventricular ejection fraction, by estimation, is 35 to 40%. The left ventricle has moderately decreased function. The left  ventricle demonstrates regional wall motion abnormalities (see scoring diagram/findings for description). Left ventricular  diastolic parameters are consistent with Grade I diastolic dysfunction (impaired relaxation). Elevated left ventricular end-diastolic pressure.  2. Right ventricular systolic function is normal. The right ventricular size is normal. There is mildly elevated pulmonary artery systolic pressure.  3. Right atrial size was mildly dilated.  4. The mitral valve is normal in structure. Mild mitral valve regurgitation. No evidence of mitral stenosis.  5. The aortic valve is tricuspid. There is mild calcification of the aortic valve. Aortic valve regurgitation is not visualized. No aortic stenosis is present.  6. Aortic dilatation noted. There is mild dilatation of the ascending aorta, measuring 37 mm.  7. The inferior vena cava is dilated in size with >50% respiratory variability, suggesting right atrial pressure of 8 mmHg. FINDINGS  Left Ventricle: Left ventricular ejection fraction, by estimation, is 35 to 40%. The left ventricle has moderately decreased function. The left ventricle demonstrates regional wall motion abnormalities. Definity contrast agent was given IV to delineate the left ventricular endocardial borders. The left ventricular internal cavity size was normal in size. There is no left ventricular hypertrophy. Left ventricular diastolic parameters are consistent with Grade I diastolic dysfunction (impaired relaxation). Elevated left ventricular end-diastolic pressure.  LV Wall Scoring: The entire septum, entire inferior wall, and apex are hypokinetic. The entire anterior wall and entire lateral wall are normal. Right Ventricle: The right ventricular size is normal. No increase in right ventricular wall thickness. Right ventricular systolic function is normal. There is mildly elevated pulmonary artery systolic pressure. The tricuspid regurgitant velocity is 2.72  m/s, and with an assumed  right atrial pressure of 8 mmHg, the estimated right ventricular systolic pressure is 123456 mmHg. Left Atrium: Left atrial size was normal in size. Right Atrium: Right atrial size was mildly dilated. Pericardium: There is no evidence of pericardial effusion.  Mitral Valve: The mitral valve is normal in structure. Mild mitral valve regurgitation, with posteriorly-directed jet. No evidence of mitral valve stenosis. Tricuspid Valve: The tricuspid valve is normal in structure. Tricuspid valve regurgitation is mild . No evidence of tricuspid stenosis. Aortic Valve: The aortic valve is tricuspid. There is mild calcification of the aortic valve. Aortic valve regurgitation is not visualized. No aortic stenosis is present. Pulmonic Valve: The pulmonic valve was normal in structure. Pulmonic valve regurgitation is trivial. No evidence of pulmonic stenosis. Aorta: Aortic dilatation noted. There is mild dilatation of the ascending aorta, measuring 37 mm. Venous: The inferior vena cava is dilated in size with greater than 50% respiratory variability, suggesting right atrial pressure of 8 mmHg. IAS/Shunts: No atrial level shunt detected by color flow Doppler.  LEFT VENTRICLE PLAX 2D LVIDd:         5.10 cm     Diastology LVIDs:         4.10 cm     LV e' medial:    5.36 cm/s LV PW:         1.20 cm     LV E/e' medial:  14.3 LV IVS:        1.00 cm     LV e' lateral:   6.41 cm/s LVOT diam:     2.10 cm     LV E/e' lateral: 12.0 LV SV:         62 LV SV Index:   33 LVOT Area:     3.46 cm                             3D Volume EF: LV Volumes (MOD)           3D EF:        39 % LV vol d, MOD A2C: 86.2 ml LV EDV:       135 ml LV vol d, MOD A4C: 90.6 ml LV ESV:       83 ml LV vol s, MOD A2C: 54.0 ml LV SV:        52 ml LV vol s, MOD A4C: 61.6 ml LV SV MOD A2C:     32.2 ml LV SV MOD A4C:     90.6 ml LV SV MOD BP:      30.7 ml RIGHT VENTRICLE RV S prime:     11.50 cm/s TAPSE (M-mode): 2.1 cm LEFT ATRIUM             Index        RIGHT ATRIUM            Index LA diam:        3.50 cm 1.88 cm/m   RA Area:     19.30 cm LA Vol (A2C):   64.0 ml 34.36 ml/m  RA Volume:   55.90 ml  30.01 ml/m LA Vol (A4C):   53.3 ml 28.61 ml/m LA Biplane Vol: 58.9 ml 31.62 ml/m  AORTIC VALVE LVOT Vmax:   106.00 cm/s LVOT Vmean:  66.000 cm/s LVOT VTI:    0.180 m  AORTA Ao Asc diam: 3.70 cm MITRAL VALVE               TRICUSPID VALVE MV Area (PHT): 3.99 cm    TR Peak grad:   29.6 mmHg MV Decel Time: 190 msec    TR Vmax:        272.00 cm/s MV E velocity: 76.70  cm/s MV A velocity: 66.00 cm/s  SHUNTS MV E/A ratio:  1.16        Systemic VTI:  0.18 m                            Systemic Diam: 2.10 cm Skeet Latch MD Electronically signed by Skeet Latch MD Signature Date/Time: 03/19/2022/2:21:27 PM    Final     Cardiac Studies   Patient Profile     76 y.o. female with a hx of morbid obesity, OSA on CPAP, BOOP with tracheobronchomalacia, HFpEF, persistent atrial fib status post AVN ablation and pacemaker implant '17 (failed upgrade to BiV), twiddler's syndrome who is being seen for the evaluation of elevated troponin, felt to be demand ischemia in the setting of acute febrile illness.   Assessment & Plan    Active Problems:   HTN (hypertension)   Atrial fibrillation (HCC)   Sleep apnea   Type 2 diabetes mellitus with diabetic neuropathy, without long-term current use of insulin (HCC)   Stage 3a chronic kidney disease (HCC)   Chronic anticoagulation   Post concussive syndrome   COPD with acute exacerbation (HCC)   Severe sepsis (HCC)   Non-STEMI (non-ST elevated myocardial infarction) (HCC)   Hypotension   Morbid obesity ([place])   Acute on chronic combined systolic and diastolic CHF (congestive heart failure) (HCC)  Elevated Troponin -- in the setting of acute illness, no anginal symptoms. hsTn peaked at 2438. EKG sinus with V-pacing.  --  R/LHC showed mild CAD and mild PHTN - continue medical therapy.  - likely stress cardiomyopathy in setting of acute  illness, recommend rechecking echo in 6 weeks and follow up with cardiology.    Sepsis Suspected PNA BOOP Acute on chronic hypoxia -- on chronic 4L [place], follows with pulmonary as an outpatient -- respiratory panel negative. BC NGTD. CT negative for PE suggestion of pulmonary hypertension, emphysema with lower lung field suggesting scarring versus subsegmental atelectasis -- antibiotics per primary team   Persistent Afib S/p AVN ablation with PPM '17 -- maintaining sinus rhythm -- continue Tikosyn -- Xarelto held with need for cath, has now been resumed.   HFrEF (EF 35-40%) -- last [facility] with preserved LV function, does not appear volume overloaded on exam -- repeat echo showed drop in LVEF to 35-40%, g1DD, normal RV size and function, mildly dilated RA, elevated LVEDP with hypokinetic septum and inferior wall. Suspect demand ischemia/stress CM with minor CAD on cath.  -- GDMT has been limited in the past, unable to tolerate Entresto due to low BP and no SGLT2i with UTIs - resume home torsemide when BP allows. I/O appears inaccurate.   Hypotension -- likely from hypovolemia/sepsis -- home BP meds held presently, resume when able.    Hypokalemia -- resolved   [place] will sign off and follow as needed while in hospital. Medication Recommendations:  no change Other recommendations (labs, testing, etc):  echo in 6 weeks Follow up as an outpatient:  will arrange f/u with [facility] per patient's request.   For questions or updates, please contact [place] Please consult [email or web address] for contact info under        Signed, Elouise Munroe, MD  03/21/2022, 8:27 AM

## 2022-03-21 NOTE — Progress Notes (Signed)
Occupational Therapy Treatment Patient Details Name: Kathleen [name] MRN: [id number] DOB: [birth year] Today's Date: 03/21/2022   History of present illness Pt is 76 yo female who presented to ED 10/10 via EMS with fever, confusion for 1 day. Work up revealed sepsis present on admission. Hospital course complicated by elevated troponin, suspect elevation due to demand ischemia in setting of illness. Plan for heart cath on 10/13. 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   OT comments  Patient seated on EOB upon entry.  Patient address LB dressing with min assist to donn socks, toilet transfers with RW and min guard assist, and for grooming seated and standing at sink. Patient required frequent rest breaks due to complaints of fatigue. Patient attempted ambulation on RA only with patient SpO2 dropping to 84 after 10 feet and required seated rest break. Patient returned to 4 liters and quickly returned to 98.  Patient states she would rather go home and received HHOT than go to SNF. Acute OT to continue to follow.    Recommendations for follow up therapy are one component of a multi-disciplinary discharge planning process, led by the attending physician.  Recommendations may be updated based on patient status, additional functional criteria and insurance authorization.    Follow Up Recommendations  Home health OT (pending progress vs rehab at [facility])    Assistance Recommended at Discharge Intermittent Supervision/Assistance  Patient can return home with the following  A little help with walking and/or transfers;A little help with bathing/dressing/bathroom;Assistance with cooking/housework;Assist for transportation   Equipment Recommendations  None recommended by OT    Recommendations for Other Services      Precautions / Restrictions  Precautions Precautions: Fall Precaution Comments: watch O2, on 4L Restrictions Weight Bearing Restrictions: No       Mobility Bed Mobility Overal bed mobility: Needs Assistance Bed Mobility: Sit to Supine       Sit to supine: Min guard, HOB elevated   General bed mobility comments: increased time and min guard for safety    Transfers Overall transfer level: Needs assistance Equipment used: Rolling walker (2 wheels) Transfers: Sit to/from Stand Sit to Stand: Min guard           General transfer comment: min guard for safety     Balance Overall balance assessment: Needs assistance Sitting-balance support: No upper extremity supported, Feet supported Sitting balance-Leahy Scale: Fair Sitting balance - Comments: seated on EOB upon entry   Standing balance support: Bilateral upper extremity supported, No upper extremity supported, During functional activity Standing balance-Leahy Scale: Fair Standing balance comment: stood at sink for grooming tasks without UE support                           ADL either performed or assessed with clinical judgement   ADL Overall ADL's : Needs assistance/impaired     Grooming: Wash/dry hands;Wash/dry face;Oral care;Set up;Supervision/safety;Sitting;Standing Grooming Details (indicate cue type and reason): supervision when standing             Lower Body Dressing: Minimal assistance;Sitting/lateral leans Lower Body Dressing Details (indicate cue type and reason): to change socks seated on EOB Toilet Transfer: BSC/3in1;Rolling walker (2 wheels);Min guard Toilet Transfer Details (indicate cue type and reason): min guard for safety with verbal cues for hand placement  General ADL Comments: frequent rest breaks due to fatigue    Extremity/Trunk Assessment              Vision       Perception     Praxis      Cognition Arousal/Alertness: Awake/alert Behavior During Therapy: WFL for tasks  assessed/performed Overall Cognitive Status: Within Functional Limits for tasks assessed                                          Exercises      Shoulder Instructions       General Comments attempted ambulation on RA only with patient tolerating 10 feet before requiring rest break and SpO2 dropping to 84. Patient returned to 4 liters and returned to 98 SpO2    Pertinent Vitals/ Pain       Pain Assessment Pain Assessment: Faces Faces Pain Scale: Hurts a little bit Pain Location: right hip Pain Descriptors / Indicators: Discomfort Pain Intervention(s): Limited activity within patient's tolerance, Monitored during session, Repositioned  Home Living                                          Prior Functioning/Environment              Frequency  Min 2X/week        Progress Toward Goals  OT Goals(current goals can now be found in the care plan section)  Progress towards OT goals: Progressing toward goals  Acute Rehab OT Goals Patient Stated Goal: go home OT Goal Formulation: With patient Time For Goal Achievement: 04/02/22 Potential to Achieve Goals: Good ADL Goals Pt Will Perform Upper Body Dressing: with modified independence;sitting Pt Will Perform Lower Body Dressing: with modified independence;sit to/from stand Pt Will Transfer to Toilet: with modified independence;ambulating;bedside commode Pt Will Perform Toileting - Clothing Manipulation and hygiene: with modified independence;sit to/from stand  Plan Discharge plan remains appropriate    Co-evaluation                 AM-PAC OT "6 Clicks" Daily Activity     Outcome Measure   Help from another person eating meals?: None Help from another person taking care of personal grooming?: A Little Help from another person toileting, which includes using toliet, bedpan, or urinal?: A Little Help from another person bathing (including washing, rinsing, drying)?: A  Little Help from another person to put on and taking off regular upper body clothing?: A Little Help from another person to put on and taking off regular lower body clothing?: A Little 6 Click Score: 19    End of Session Equipment Utilized During Treatment: Rolling walker (2 wheels);Oxygen (4 liters)  OT Visit Diagnosis: Unsteadiness on feet (R26.81);Muscle weakness (generalized) (M62.81);Pain Pain - Right/Left: Right Pain - part of body: Hip   Activity Tolerance Patient limited by fatigue   Patient Left in bed;with call bell/phone within reach   Nurse Communication Mobility status;Other (comment) (SpO2 readings on RA)        Time: 6948-5462 OT Time Calculation (min): 60 min  Charges: OT General Charges $OT Visit: 1 Visit OT Treatments $Self Care/Home Management : 53-67 mins  Alfonse Flavors, OTA Acute Rehabilitation Services  Office [phone]   Dewain Penning 03/21/2022, 9:32 AM

## 2022-03-21 NOTE — Plan of Care (Signed)
  Problem: Clinical Measurements: Goal: Cardiovascular complication will be avoided Outcome: Progressing   Problem: Activity: Goal: Risk for activity intolerance will decrease Outcome: Progressing   Problem: Nutrition: Goal: Adequate nutrition will be maintained Outcome: Progressing   Problem: Coping: Goal: Level of anxiety will decrease Outcome: Progressing   Problem: Elimination: Goal: Will not experience complications related to urinary retention Outcome: Progressing   Problem: Pain Managment: Goal: General experience of comfort will improve Outcome: Progressing   Problem: Clinical Measurements: Goal: Respiratory complications will improve Outcome: Not Progressing

## 2022-03-21 NOTE — Progress Notes (Signed)
PROGRESS NOTE  Kathleen [name] JJH:[id number] DOB: 1946/01/07   PCP: [name], MD  Patient is from: [facility].  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  In ED, febrile to 103.  WBC 9.8.  CXR with bilateral lower lobe opacity but mostly chronic.  CT head without acute finding.  COVID-19 and influenza PCR nonreactive.  Cultures drawn.  She was started on broad-spectrum antibiotics, and admitted.  Patient's troponin trended from 03-2499 overnight.  She had no chest pain or acute ischemic finding on EKG.  Cardiology consulted. TTE with LVEF of 35 to 40%, RWMA, G1-DD.  Patient was started on IV heparin.  Underwent R/LHC on 10/13 that showed mild nonobstructive CAD and mild PHTN.  Cardiology signed off.  Antibiotic de-escalated to CTX and doxycycline for possible pneumonia.  Started systemic Solu-Medrol with nebs for shortness of breath and cough.   Subjective: Seen and examined earlier this morning.  She reports having a rough night due to cough and short of breath.  Cough is dry.  Reports chest pain with cough.  Denies GI or UTI symptoms.  Objective: Vitals:   03/21/22 0355 03/21/22 0500 03/21/22 0727 03/21/22 1110  BP: 126/65  101/84 132/66  Pulse: 69  76 86  Resp: 19  18 18   Temp: 98.2 F (36.8 C)  98.2 F (36.8 C) 97.7 F (36.5 C)  TempSrc: Oral  Oral Oral  SpO2: 100%  100% 98%  Weight:  87.8 kg    Height:        Examination:  GENERAL: No apparent distress.  Nontoxic. HEENT: MMM.  Vision and hearing grossly intact.  NECK: Supple.  No apparent JVD.  RESP:  No IWOB.  Fair aeration but with mild bibasilar crackles.  Hacking dry  cough during exam CVS:  RRR. Heart sounds normal.  ABD/GI/GU: BS+. Abd soft, NTND.  MSK/EXT:  Moves extremities. No apparent deformity.  Trace BLE edema SKIN: no apparent skin lesion or wound NEURO: Awake and alert. Oriented appropriately.  No apparent focal neuro deficit. PSYCH: Calm. Normal affect.   Procedures:  10/13-R/LHC showed mild nonobstructive CAD and mild PHTN  Microbiology summarized: COVID-19 and influenza PCR nonreactive. Full RVP negative. Blood cultures NGTD MRSA PCR screen negative.  Assessment and plan: Active Problems:   HTN (hypertension)   Atrial fibrillation (HCC)   Sleep apnea   Type 2 diabetes mellitus with diabetic neuropathy, without long-term current use of insulin (HCC)   Stage 3a chronic kidney disease (HCC)   Chronic anticoagulation   Post concussive syndrome   COPD with acute exacerbation (HCC)   Severe sepsis (HCC)   Non-STEMI (non-ST elevated myocardial infarction) (HCC)   Hypotension   Morbid obesity (HCC)   Acute on chronic combined systolic and diastolic CHF (congestive heart failure) (HCC)  COPD with mild acute exacerbation/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  Lung exam with bibasilar crackles more on the left. -Continue IV Solu-Medrol. -Continue ceftriaxone and doxycycline -Discontinue inhalers.  Scheduled Xopenex and Atrovent every 8 hours and as needed every 4 hours. -Scheduled Mucinex.  Added Tessalon Perles -Incentive spirometry -Wean oxygen as able  regardless of home 4 L.  Severe sepsis likely due to pneumonia: POA.  Febrile to 103 with tachypnea.  She has dry hacking cough.  CXR raises concern for by basilar opacities but CTA chest negative for PE or pneumonia.  COVID-19, influenza PCR full RVP panel negative.  Blood cultures NGTD.  MRSA PCR negative.  No lactic acidosis.  Markedly elevated Pro-Cal but improving. -Continue CAP coverage with CTX and doxycycline  Non-STEMI:  Troponin 10>>> 2438>> 1900. TTE with LVEF of 35 to 40%, RWMA and G1 DD.  Remains chest pain-free but dry cough, SOB and DOE.  R/LHC with mild CAD and mild PHTN. -Cardiology signed off.  Acute combined CHF/mild PHTN: BNP elevated.  TTE and R/LHC as above.  Concern about a stress-induced cardiomyopathy in the setting of acute illness.  Appears euvolemic on exam.  She is on torsemide at home. -Cardiology recommends repeat TTE in 6 weeks -P.o. Lasix 40 mg x 1 today -Strict intake and output.  Hypotension: Resolved. -Continue holding antihypertensive meds -Lasix as above  Flulike illness-due to flu vaccine?  Had weakness, confusion, fever, myalgia after flu vaccine.  COVID-19, influenza and full RVP negative.  Symptoms seems to have resolved. -Supportive care  Paroxysmal A-fib on Eliquis: In sinus rhythm. -Continue home Tikosyn and Xarelto  Complete heart block s/p PPM -Per cardiology  Hypokalemia/hypophosphatemia -Monitor and replenish as appropriate  Generalized weakness -PT/OT eval  Morbid obesity: Elevated BMI with comorbidity as above. Body mass index is 36.57 kg/m.           DVT prophylaxis:   rivaroxaban (XARELTO) tablet 20 mg  Code Status: Full code Family Communication: None at bedside. Level of care: Telemetry Medical Status is: Inpatient Remains inpatient appropriate because: See OPD exacerbation/respiratory distress   Final disposition: Likely home in the next 24 to 48 hours. Consultants:  Cardiology-signed off  Sch Meds:  Scheduled Meds:  amitriptyline  25 mg Oral QHS   azelastine  2 spray Each Nare BID   benzonatate  100 mg Oral TID   cholecalciferol  2,000 Units Oral QHS   dofetilide  250 mcg Oral BID   doxycycline  100 mg Oral Q12H   guaiFENesin  600 mg Oral BID   levalbuterol  0.63 mg Nebulization Q8H   And   ipratropium  0.5 mg Nebulization Q8H   magnesium oxide  400 mg Oral QHS   methylPREDNISolone (SOLU-MEDROL) injection  40 mg Intravenous  BID   pantoprazole  40 mg Oral Daily   rivaroxaban  20 mg Oral Daily   sodium chloride flush  3 mL Intravenous Q12H   sodium chloride flush  3 mL Intravenous Q12H   Continuous Infusions:  sodium chloride     cefTRIAXone (ROCEPHIN)  IV 200 mL/hr at 03/21/22 0605   sodium phosphate 30 mmol in dextrose 5 % 250 mL infusion 30 mmol (03/21/22 0906)   PRN Meds:.sodium chloride, acetaminophen **OR** acetaminophen, guaiFENesin-dextromethorphan, levalbuterol **AND** ipratropium, sodium chloride flush  Antimicrobials: Anti-infectives (From admission, onward)    Start     Dose/Rate Route Frequency Ordered Stop   03/19/22 1500  cefTRIAXone (ROCEPHIN) 1 g in sodium chloride 0.9 % 100 mL IVPB        1 g 200 mL/hr over 30 Minutes Intravenous Every 24 hours 03/19/22 1400 03/23/22 1459   03/19/22 1400  ceFEPIme (MAXIPIME) 2 g in sodium chloride 0.9 % 100 mL IVPB  Status:  Discontinued        2 g 200 mL/hr over 30 Minutes  Intravenous Every 8 hours 03/19/22 0916 03/19/22 1400   03/19/22 1000  vancomycin (VANCOREADY) IVPB 1250 mg/250 mL  Status:  Discontinued        1,250 mg 166.7 mL/hr over 90 Minutes Intravenous Every 36 hours 03/18/22 1257 03/19/22 1331   03/19/22 1000  doxycycline (VIBRA-TABS) tablet 100 mg        100 mg Oral Every 12 hours 03/19/22 0726     03/19/22 0900  vancomycin (VANCOREADY) IVPB 1250 mg/250 mL  Status:  Discontinued        1,250 mg 166.7 mL/hr over 90 Minutes Intravenous Every 36 hours 03/17/22 2311 03/18/22 0027   03/19/22 0800  azithromycin (ZITHROMAX) 500 mg in sodium chloride 0.9 % 250 mL IVPB  Status:  Discontinued        500 mg 250 mL/hr over 60 Minutes Intravenous Every 24 hours 03/19/22 0714 03/19/22 0725   03/18/22 1315  ceFEPIme (MAXIPIME) 2 g in sodium chloride 0.9 % 100 mL IVPB  Status:  Discontinued        2 g 200 mL/hr over 30 Minutes Intravenous Every 12 hours 03/18/22 1257 03/19/22 0916   03/18/22 0730  ceFEPIme (MAXIPIME) 2 g in sodium chloride 0.9 % 100 mL  IVPB  Status:  Discontinued        2 g 200 mL/hr over 30 Minutes Intravenous Every 12 hours 03/17/22 2311 03/18/22 0027   03/18/22 0000  cefTRIAXone (ROCEPHIN) 2 g in sodium chloride 0.9 % 100 mL IVPB  Status:  Discontinued        2 g 200 mL/hr over 30 Minutes Intravenous Every 24 hours 03/17/22 2356 03/18/22 1257   03/17/22 1915  vancomycin (VANCOREADY) IVPB 2000 mg/400 mL        2,000 mg 200 mL/hr over 120 Minutes Intravenous  Once 03/17/22 1901 03/18/22 0109   03/17/22 1915  ceFEPIme (MAXIPIME) 2 g in sodium chloride 0.9 % 100 mL IVPB        2 g 200 mL/hr over 30 Minutes Intravenous  Once 03/17/22 1901 03/17/22 2034        I have personally reviewed the following labs and images: CBC: Recent Labs  Lab 03/17/22 1847 03/19/22 0027 03/20/22 0202 03/20/22 1309 03/20/22 1312 03/20/22 1316 03/21/22 0022  WBC 9.8 7.3 6.8  --   --   --  4.7  NEUTROABS 9.0*  --   --   --   --   --   --   HGB 11.8* 10.1* 10.4* 9.2* 9.5* 9.5* 11.0*  HCT 36.8 29.8* 31.7* 27.0* 28.0* 28.0* 33.2*  MCV 86.2 82.1 83.6  --   --   --  82.8  PLT 195 164 177  --   --   --  186   BMP &GFR Recent Labs  Lab 03/17/22 1847 03/18/22 0549 03/19/22 0026 03/19/22 0027 03/20/22 0202 03/20/22 1309 03/20/22 1312 03/20/22 1316 03/21/22 0022  NA 134*  --  132*  --  137 137 137 132* 136  K 4.1  --  3.2*  --  4.4 4.3 4.3 4.0 4.5  CL 102  --  102  --  107  --   --   --  108  CO2 21*  --  22  --  21*  --   --   --  21*  GLUCOSE 147*  --  163*  --  128*  --   --   --  170*  BUN 8  --  9  --  7*  --   --   --  8  CREATININE 0.83 0.85 0.75  --  0.61  --   --   --  0.67  CALCIUM 9.0  --  8.4*  --  8.6*  --   --   --  8.6*  MG  --  1.9  --  2.0 2.0  --   --   --  2.1  PHOS  --   --  1.2*  --   --   --   --   --  1.6*   Estimated Creatinine Clearance: 61.2 mL/min (by C-G formula based on SCr of 0.67 mg/dL). Liver & Pancreas: Recent Labs  Lab 03/17/22 1847 03/19/22 0026 03/21/22 0022  AST 15  --   --   ALT 11   --   --   ALKPHOS 31*  --   --   BILITOT 0.8  --   --   PROT 6.3*  --   --   ALBUMIN 3.4* 2.7* 3.0*   No results for input(s): "LIPASE", "AMYLASE" in the last 168 hours. No results for input(s): "AMMONIA" in the last 168 hours. Diabetic: No results for input(s): "HGBA1C" in the last 72 hours. Recent Labs  Lab 03/18/22 0832 03/19/22 0626 03/20/22 0620 03/21/22 0618 03/21/22 1111  GLUCAP 137* 143* 148* 184* 122*   Cardiac Enzymes: No results for input(s): "CKTOTAL", "CKMB", "CKMBINDEX", "TROPONINI" in the last 168 hours. No results for input(s): "PROBNP" in the last 8760 hours. Coagulation Profile: No results for input(s): "INR", "PROTIME" in the last 168 hours. Thyroid Function Tests: No results for input(s): "TSH", "T4TOTAL", "FREET4", "T3FREE", "THYROIDAB" in the last 72 hours. Lipid Profile: No results for input(s): "CHOL", "HDL", "LDLCALC", "TRIG", "CHOLHDL", "LDLDIRECT" in the last 72 hours. Anemia Panel: No results for input(s): "VITAMINB12", "FOLATE", "FERRITIN", "TIBC", "IRON", "RETICCTPCT" in the last 72 hours. Urine analysis:    Component Value Date/Time   COLORURINE YELLOW 03/17/2022 2244   APPEARANCEUR HAZY (A) 03/17/2022 2244   APPEARANCEUR Hazy 12/25/2012 1653   LABSPEC 1.012 03/17/2022 2244   LABSPEC 1.021 12/25/2012 1653   PHURINE 7.0 03/17/2022 2244   GLUCOSEU NEGATIVE 03/17/2022 2244   GLUCOSEU NEGATIVE 08/07/2021 1219   HGBUR NEGATIVE 03/17/2022 2244   BILIRUBINUR NEGATIVE 03/17/2022 2244   BILIRUBINUR Negative 12/25/2012 [place] 03/17/2022 2244   PROTEINUR NEGATIVE 03/17/2022 2244   UROBILINOGEN 1.0 08/07/2021 1219   NITRITE NEGATIVE 03/17/2022 2244   LEUKOCYTESUR LARGE (A) 03/17/2022 2244   LEUKOCYTESUR Negative 12/25/2012 1653   Sepsis Labs: Invalid input(s): "PROCALCITONIN", "LACTICIDVEN"  Microbiology: Recent Results (from the past 240 hour(s))  Culture, blood (routine x 2)     Status: None (Preliminary result)    Collection Time: 03/17/22  6:47 PM   Specimen: BLOOD  Result Value Ref Range Status   Specimen Description BLOOD BLOOD LEFT FOREARM  Final   Special Requests   Final    BOTTLES DRAWN AEROBIC AND ANAEROBIC Blood Culture results may not be optimal due to an excessive volume of blood received in culture bottles   Culture   Final    NO GROWTH 4 DAYS Performed at [facility], [place] [address] [place], [place] [address]    Report Status PENDING  Incomplete  Resp Panel by RT-PCR (Flu A&B, Covid)     Status: None   Collection Time: 03/17/22  6:47 PM   Specimen: Nasal Swab  Result Value Ref Range Status   SARS Coronavirus 2  by RT PCR NEGATIVE NEGATIVE Final    Comment: (NOTE) SARS-CoV-2 target nucleic acids are NOT DETECTED.  The SARS-CoV-2 RNA is generally detectable in upper respiratory specimens during the acute phase of infection. The lowest concentration of SARS-CoV-2 viral copies this assay can detect is 138 copies/mL. A negative result does not preclude SARS-Cov-2 infection and should not be used as the sole basis for treatment or other patient management decisions. A negative result may occur with  improper specimen collection/handling, submission of specimen other than nasopharyngeal swab, presence of viral mutation(s) within the areas targeted by this assay, and inadequate number of viral copies(<138 copies/mL). A negative result must be combined with clinical observations, patient history, and epidemiological information. The expected result is Negative.  Fact Sheet for Patients:  EntrepreneurPulse.com.au  Fact Sheet for Healthcare Providers:  IncredibleEmployment.be  This test is no t yet approved or cleared by the [place] FDA and  has been authorized for detection and/or diagnosis of SARS-CoV-2 by FDA under an Emergency Use Authorization (EUA). This EUA will remain  in effect (meaning this test can be used) for the duration  of the COVID-19 declaration under Section 564(b)(1) of the Act, 21 U.S.C.section 360bbb-3(b)(1), unless the authorization is terminated  or revoked sooner.       Influenza A by PCR NEGATIVE NEGATIVE Final   Influenza B by PCR NEGATIVE NEGATIVE Final    Comment: (NOTE) The Xpert Xpress SARS-CoV-2/FLU/RSV plus assay is intended as an aid in the diagnosis of influenza from Nasopharyngeal swab specimens and should not be used as a sole basis for treatment. Nasal washings and aspirates are unacceptable for Xpert Xpress SARS-CoV-2/FLU/RSV testing.  Fact Sheet for Patients: EntrepreneurPulse.com.au  Fact Sheet for Healthcare Providers: IncredibleEmployment.be  This test is not yet approved or cleared by the [place] FDA and has been authorized for detection and/or diagnosis of SARS-CoV-2 by FDA under an Emergency Use Authorization (EUA). This EUA will remain in effect (meaning this test can be used) for the duration of the COVID-19 declaration under Section 564(b)(1) of the Act, 21 U.S.C. section 360bbb-3(b)(1), unless the authorization is terminated or revoked.  Performed at [facility], [place] [address], [place], [place] [address]   Culture, blood (routine x 2)     Status: None (Preliminary result)   Collection Time: 03/17/22  7:55 PM   Specimen: BLOOD  Result Value Ref Range Status   Specimen Description BLOOD BLOOD RIGHT HAND  Final   Special Requests   Final    BOTTLES DRAWN AEROBIC AND ANAEROBIC Blood Culture adequate volume   Culture   Final    NO GROWTH 4 DAYS Performed at [facility], [place] [address], [place], [place] [address]    Report Status PENDING  Incomplete  Respiratory (~20 pathogens) panel by PCR     Status: None   Collection Time: 03/17/22  9:23 PM   Specimen: Respiratory  Result Value Ref Range Status   Adenovirus NOT DETECTED NOT DETECTED Final   Coronavirus 229E NOT DETECTED NOT DETECTED Final     Comment: (NOTE) The Coronavirus on the Respiratory Panel, DOES NOT test for the novel  Coronavirus (2019 nCoV)    Coronavirus HKU1 NOT DETECTED NOT DETECTED Final   Coronavirus NL63 NOT DETECTED NOT DETECTED Final   Coronavirus OC43 NOT DETECTED NOT DETECTED Final   Metapneumovirus NOT DETECTED NOT DETECTED Final   Rhinovirus / Enterovirus NOT DETECTED NOT DETECTED Final   Influenza A NOT DETECTED NOT DETECTED Final  Influenza B NOT DETECTED NOT DETECTED Final   Parainfluenza Virus 1 NOT DETECTED NOT DETECTED Final   Parainfluenza Virus 2 NOT DETECTED NOT DETECTED Final   Parainfluenza Virus 3 NOT DETECTED NOT DETECTED Final   Parainfluenza Virus 4 NOT DETECTED NOT DETECTED Final   Respiratory Syncytial Virus NOT DETECTED NOT DETECTED Final   Bordetella pertussis NOT DETECTED NOT DETECTED Final   Bordetella Parapertussis NOT DETECTED NOT DETECTED Final   Chlamydophila pneumoniae NOT DETECTED NOT DETECTED Final   Mycoplasma pneumoniae NOT DETECTED NOT DETECTED Final    Comment: Performed at [facility], [place] [address] [place], [place] [address]  MRSA Next Gen by PCR, Nasal     Status: None   Collection Time: 03/19/22 12:03 AM   Specimen: Nasal Mucosa; Nasal Swab  Result Value Ref Range Status   MRSA by PCR Next Gen NOT DETECTED NOT DETECTED Final    Comment: (NOTE) The GeneXpert MRSA Assay (FDA approved for NASAL specimens only), is one component of a comprehensive MRSA colonization surveillance program. It is not intended to diagnose MRSA infection nor to guide or monitor treatment for MRSA infections. Test performance is not FDA approved in patients less than 30 years old. Performed at [facility], [place] [address], [place], [place] [address]     Radiology Studies: No results found.    Governor Matos [place]. [place]  If 7PM-7AM, please contact night-coverage [email or web address] 03/21/2022, 12:59 PM

## 2022-03-21 NOTE — Progress Notes (Signed)
Mobility Specialist Progress Note    03/21/22 1528  Mobility  Activity Ambulated independently in hallway  Level of Assistance Modified independent, requires aide device or extra time  Assistive Device Four wheel walker  Distance Ambulated (ft) 170 ft  Activity Response Tolerated well  Mobility Referral Yes  $Mobility charge 1 Mobility   Pre-Mobility: 76 HR, 90% SpO2 During Mobility: 86 HR Post-Mobility: 83 HR  Pt received sitting EOB and agreeable. No complaints on walk. Returned to [facility] with RN present.   Hildred Alamin Mobility Specialist  Secure Chat Only

## 2022-03-22 ENCOUNTER — Other Ambulatory Visit: Payer: Self-pay | Admitting: Physician Assistant

## 2022-03-22 DIAGNOSIS — G4730 Sleep apnea, unspecified: Secondary | ICD-10-CM | POA: Diagnosis not present

## 2022-03-22 DIAGNOSIS — I5022 Chronic systolic (congestive) heart failure: Secondary | ICD-10-CM

## 2022-03-22 DIAGNOSIS — N1831 Chronic kidney disease, stage 3a: Secondary | ICD-10-CM | POA: Diagnosis not present

## 2022-03-22 DIAGNOSIS — A419 Sepsis, unspecified organism: Secondary | ICD-10-CM | POA: Diagnosis not present

## 2022-03-22 DIAGNOSIS — E1140 Type 2 diabetes mellitus with diabetic neuropathy, unspecified: Secondary | ICD-10-CM | POA: Diagnosis not present

## 2022-03-22 LAB — RENAL FUNCTION PANEL
Albumin: 3.1 g/dL — ABNORMAL LOW (ref 3.5–5.0)
Anion gap: 9 (ref 5–15)
BUN: 13 mg/dL (ref 8–23)
CO2: 23 mmol/L (ref 22–32)
Calcium: 8.7 mg/dL — ABNORMAL LOW (ref 8.9–10.3)
Chloride: 106 mmol/L (ref 98–111)
Creatinine, Ser: 0.68 mg/dL (ref 0.44–1.00)
GFR, Estimated: >60 mL/min (ref >60)
Glucose, Bld: 145 mg/dL — ABNORMAL HIGH (ref 70–99)
Phosphorus: 2.7 mg/dL (ref 2.5–4.6)
Potassium: 4.2 mmol/L (ref 3.5–5.1)
Sodium: 138 mmol/L (ref 135–145)

## 2022-03-22 LAB — CBC
HCT: 32.2 % — ABNORMAL LOW (ref 36.0–46.0)
Hemoglobin: 10.4 g/dL — ABNORMAL LOW (ref 12.0–15.0)
MCH: 26.8 pg (ref 26.0–34.0)
MCHC: 32.3 g/dL (ref 30.0–36.0)
MCV: 83 fL (ref 80.0–100.0)
Platelets: 225 10*3/uL (ref 150–400)
RBC: 3.88 MIL/uL (ref 3.87–5.11)
RDW: 14.1 % (ref 11.5–15.5)
WBC: 8.4 10*3/uL (ref 4.0–10.5)
nRBC: 0.2 % (ref 0.0–0.2)

## 2022-03-22 LAB — CULTURE, BLOOD (ROUTINE X 2)
Culture: NO GROWTH
Culture: NO GROWTH
Special Requests: ADEQUATE

## 2022-03-22 LAB — BRAIN NATRIURETIC PEPTIDE: B Natriuretic Peptide: 380.3 pg/mL — ABNORMAL HIGH (ref 0.0–100.0)

## 2022-03-22 LAB — GLUCOSE, CAPILLARY: Glucose-Capillary: 182 mg/dL — ABNORMAL HIGH (ref 70–99)

## 2022-03-22 LAB — MAGNESIUM: Magnesium: 2 mg/dL (ref 1.7–2.4)

## 2022-03-22 LAB — LIPOPROTEIN A (LPA): Lipoprotein (a): 46.2 nmol/L — ABNORMAL HIGH (ref <75.0)

## 2022-03-22 MED ORDER — PREDNISONE 10 MG PO TABS: 40.0000 mg | ORAL_TABLET | Freq: Every day | ORAL | 0 refills | Status: AC

## 2022-03-22 MED ORDER — MECLIZINE HCL 25 MG PO TABS
25.0000 mg | ORAL_TABLET | Freq: Two times a day (BID) | ORAL | Status: DC | PRN
  Administered 2022-03-22: 25 mg via ORAL
  Filled 2022-03-22 (×2): qty 1

## 2022-03-22 MED ORDER — IPRATROPIUM-ALBUTEROL 0.5-2.5 (3) MG/3ML IN SOLN: 3.0000 mL | Freq: Four times a day (QID) | RESPIRATORY_TRACT | 2 refills | Status: DC | PRN

## 2022-03-22 MED ORDER — BENZONATATE 100 MG PO CAPS: 100.0000 mg | ORAL_CAPSULE | Freq: Three times a day (TID) | ORAL | 0 refills | Status: DC

## 2022-03-22 NOTE — Discharge Summary (Signed)
Physician Discharge Summary  Kathleen [name] [id number] DOB: 1946/03/07 DOA: 03/17/2022  PCP: [name], MD  Admit date: 03/17/2022 Discharge date: 03/22/2022 Admitted From: ILF Disposition: ILF Recommendations for Outpatient Follow-up:  Follow up with PCP in 1 to 2 weeks Cardiology to arrange outpatient follow-up in 6 weeks Recommend outpatient follow-up with pulmonology in 1 to 2 weeks or sooner if needed Check BMP and CBC in 1 to 2 weeks Please follow up on the following pending results: None  Home Health: PT/OT Equipment/Devices: Patient has appropriate DME  Discharge Condition: Stable CODE STATUS: Full code  Follow-up Information     [name], MD. Schedule an appointment as soon as possible for a visit in 1 week(s).   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  In ED, febrile to 103.  WBC 9.8.  CXR with bilateral lower lobe opacity but mostly chronic.  CT head without acute finding.  COVID-19 and influenza PCR nonreactive.  Cultures drawn.  She was started on broad-spectrum antibiotics, and admitted.   Patient's troponin trended from 03-2499 overnight.  She had no chest pain or acute ischemic finding on EKG.  Cardiology consulted. TTE with LVEF of 35 to 40%, RWMA, G1-DD.  Patient was started on IV heparin.  Underwent R/LHC on 10/13 that showed mild nonobstructive CAD and mild PHTN.  Cardiology signed off.   Antibiotic de-escalated to CTX and doxycycline for possible pneumonia.  Started on IV Solu-Medrol with scheduled and as needed nebulizers for COPD exacerbation.  Eventually, patient's symptoms  improved.  She is cleared for discharge for outpatient follow-up by cardiology.  She completed antibiotic course for possible pneumonia.  She is discharged on p.o. prednisone, home Advair and Yupelri.  Changed albuterol nebulizer to DuoNeb.  Recommend outpatient follow-up with pulmonology.  Home health PT/OT ordered as recommended by therapy.  See individual problem list below for more.   Problems addressed during this hospitalization Active Problems:   HTN (hypertension)   Atrial fibrillation (HCC)   Sleep apnea   Type 2 diabetes mellitus with diabetic neuropathy, without long-term current use of insulin (HCC)   Stage 3a chronic kidney disease (HCC)   Chronic anticoagulation   Post concussive syndrome   COPD with acute exacerbation (HCC)   Severe sepsis (HCC)   Non-STEMI (non-ST elevated myocardial infarction) (HCC)   Hypotension   Morbid obesity ([place])   Acute on chronic combined systolic and diastolic CHF (congestive heart failure) (HCC)   COPD with mild acute exacerbation/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  Improved. -Discharged on p.o. prednisone for 2 more days. -Completed antibiotic course in-house. -Continue home Advair and Yupelri -Changing as needed albuterol to DuoNeb -[employer] -Encouraged to use minimum oxygen to keep a saturation above 90% -Encouraged to follow-up with her pulmonologist   Severe sepsis likely due to pneumonia: POA.  Febrile to 103 with tachypnea.  She has dry hacking cough.  CXR raises concern for by basilar opacities but CTA chest negative for PE or pneumonia.  COVID-19, influenza PCR full RVP panel negative.  Blood cultures NGTD.  MRSA PCR negative.  No lactic acidosis.  Markedly elevated Pro-Cal but improving.  Completed CAP coverage in house.   Non-STEMI: Troponin 10>>> 2438>> 1900. TTE with LVEF of 35 to 40%, RWMA and G1 DD.  Remains chest pain-free but dry cough, SOB and DOE.  R/LHC  with mild CAD and mild PHTN. -Cardiology signed off. -Continue home losartan, Aldactone and torsemide -Cardiology to arrange outpatient follow-up in 6 weeks for repeat echo   Acute combined CHF/mild PHTN: BNP elevated.  TTE and R/LHC as above.  Concern about a stress-induced cardiomyopathy in the setting of acute illness.  Appears euvolemic on exam.  She is on torsemide at home. -Cardiology recommends repeat TTE in 6 weeks -Continue home losartan, Aldactone and torsemide -Cardiology to arrange outpatient follow-up.   Hypotension: Resolved. -Continue home meds   Flulike illness-due to flu vaccine?  Had weakness, confusion, fever, myalgia after flu vaccine.  COVID-19, influenza and full RVP negative.  Symptoms seems to have resolved.   Paroxysmal A-fib on Eliquis: In sinus rhythm. -Continue home Tikosyn and Xarelto   Complete heart block s/p PPM   Hypokalemia/hypophosphatemia: Resolved.  Episode of BPPV: Resolved.   Generalized weakness -HH PT/OT ordered.   Morbid obesity:  Body mass index is 37.49 kg/m.            Vital signs Vitals:   03/22/22 0400 03/22/22 0505 03/22/22 0757 03/22/22 1400  BP: 121/72  126/70 130/80  Pulse: 75  80   Temp: 97.6 F (36.4 C)  97.6 F (36.4 C)   Resp: (!) 21  (!) 23   Height:      Weight:  90 kg    SpO2: 100%  100%   TempSrc: Oral  Oral   BMI (Calculated):  37.51       Discharge exam  GENERAL: No apparent distress.  Nontoxic. HEENT: MMM.  Vision and hearing grossly intact.  NECK: Supple.  No apparent JVD.  RESP:  No IWOB.  Fair aeration bilaterally. CVS:  RRR. Heart sounds normal.  ABD/GI/GU: BS+. Abd soft, NTND.  MSK/EXT:  Moves extremities. No apparent deformity. No edema.  SKIN: no apparent skin lesion or wound NEURO: Awake and alert. Oriented appropriately.  No apparent focal neuro deficit. PSYCH: Calm. Normal affect.   Discharge Instructions Discharge Instructions     (HEART FAILURE PATIENTS) Call MD:  Anytime you  have any of the following symptoms: 1) 3 pound weight gain in 24 hours or 5 pounds in 1 week 2) shortness of breath, with or without a dry hacking cough 3) swelling in the hands, feet or stomach 4) if you have to sleep on extra pillows at night in order to breathe.   Complete by: As directed    Call MD for:  difficulty breathing, headache or visual disturbances   Complete by: As directed    Call MD for:  extreme fatigue   Complete by: As directed    Call MD for:  persistant dizziness or light-headedness   Complete by: As directed    Diet - low sodium heart healthy   Complete by: As directed    Discharge instructions   Complete by: As directed    It has been a pleasure taking care of you!  You were hospitalized due to flulike illness after flu vaccines.  You have been treated for COPD, possible pneumonia, and heart failure.  You completed antibiotic course for possible pneumonia.  We are discharging on prednisone for COPD.  Continue taking your medications for  your heart.  Follow-up with your primary care doctor and pulmonologist in 1 to 2 weeks or sooner if needed.  Neurology will arrange outpatient follow-up for your heart failure.  Please review your new medication list and the directions on your medications before you take them.   Take care,   Increase activity slowly   Complete by: As directed       Allergies as of 03/22/2022       Reactions   Allopurinol Other (See Comments)   Reaction:  Dizziness    Clindamycin Anaphylaxis, Hives   Pneumococcal 13-val Conj Vacc Itching, Swelling, Rash   Dronedarone Rash   Brovana [arformoterol]    Caused muscle pain   Budesonide    Caused extreme joint pain   Entresto [sacubitril-valsartan] Other (See Comments)   hypotension   Fosamax [alendronate Sodium] Nausea Only   Jardiance [empagliflozin]    Caused a vaginal infection   Meperidine Nausea And Vomiting   Microplegia Msa-msg [plegisol]    Loopy,diarrhea   Rosuvastatin Other (See  Comments)   Reaction:  Muscle spasms    Tetracycline Hives   Adhesive [tape] Rash   Lovastatin Rash   Other reaction(s): Muscle Pain        Medication List     STOP taking these medications    albuterol (2.5 MG/3ML) 0.083% nebulizer solution Commonly known as: PROVENTIL       TAKE these medications    acetaminophen 650 MG CR tablet Commonly known as: TYLENOL Take 1,300 mg by mouth as needed for pain.   amitriptyline 25 MG tablet Commonly known as: ELAVIL Take 1 tablet (25 mg total) by mouth at bedtime.   azelastine 0.1 % nasal spray Commonly known as: ASTELIN Place 2 sprays into both nostrils 2 (two) times daily.   benzonatate 100 MG capsule Commonly known as: TESSALON Take 1 capsule (100 mg total) by mouth 3 (three) times daily.   cyanocobalamin 1000 MCG/ML injection Commonly known as: VITAMIN B12 Inject 1 mL (1,000 mcg total) into the muscle every 30 (thirty) days.   docusate sodium 100 MG capsule Commonly known as: COLACE Take 100 mg by mouth 2 (two) times daily.   dofetilide 250 MCG capsule Commonly known as: TIKOSYN Take 1 capsule (250 mcg total) by mouth in the morning and at bedtime.   febuxostat 40 MG tablet Commonly known as: ULORIC TAKE 1 TABLET(40 MG) BY MOUTH EVERY NIGHT What changed: See the new instructions.   fluticasone-salmeterol 230-21 MCG/ACT inhaler Commonly known as: ADVAIR HFA Inhale 2 puffs into the lungs daily.   ipratropium-albuterol 0.5-2.5 (3) MG/3ML Soln Commonly known as: DUONEB Take 3 mLs by nebulization every 6 (six) hours as needed (Shortness of breath, cough and/or wheeze).   Linzess 145 MCG Caps capsule Generic drug: linaclotide Take 145 mcg by mouth daily as needed (constipation).   losartan 25 MG tablet Commonly known as: COZAAR TAKE 1 TABLET(25 MG) BY MOUTH AT BEDTIME What changed: See the new instructions.   Magnesium 500 MG Tabs Take 500 mg by mouth at bedtime.   mupirocin ointment 2 % Commonly known  as: BACTROBAN Apply 1 application. topically 2 (two) times daily. What changed:  when to take this reasons to take this   Nexletol 180 MG Tabs Generic drug: Bempedoic Acid Take 1 tablet by mouth daily.   nystatin cream Commonly known as: MYCOSTATIN Apply to affected area 2 times daily What changed:  how much to take how to take this when to take this reasons to  take this additional instructions   OXYGEN Inhale 4.5 L into the lungs continuous. Use with resmed ventilator   predniSONE 10 MG tablet Commonly known as: DELTASONE Take 4 tablets (40 mg total) by mouth daily for 2 days. Start taking on: March 23, 2022   rivaroxaban 20 MG Tabs tablet Commonly known as: XARELTO Take 1 tablet (20 mg total) by mouth daily with supper.   spironolactone 25 MG tablet Commonly known as: ALDACTONE TAKE 1 TABLET(25 MG) BY MOUTH EVERY MORNING What changed: See the new instructions.   torsemide 20 MG tablet Commonly known as: DEMADEX Take 20mg  (1 Tab) Monday and Friday What changed:  how much to take how to take this when to take this   traMADol 50 MG tablet Commonly known as: ULTRAM Take 50 mg by mouth every 6 (six) hours as needed for pain.   Vitamin D3 50 MCG (2000 UT) Tabs Take 2,000 Units by mouth at bedtime.   Yupelri 175 MCG/3ML nebulizer solution Generic drug: revefenacin USE 1 VIAL IN NEBULIZER DAILY - DO NOT MIX WITH OTHER NEB MEDS,USE BEFORE OR AFTER What changed: See the new instructions.        Consultations: Cardiology  Procedures/Studies: 10/13-R/LHC showed mild nonobstructive CAD and mild PHTN   CARDIAC CATHETERIZATION  Result Date: 03/20/2022   Prox LAD to Mid LAD lesion is 25% stenosed.   Prox Cx to Dist Cx lesion is 10% stenosed.   LV end diastolic pressure is normal.   Hemodynamic findings consistent with pulmonary hypertension. Minor nonobstructive CAD Normal LV filling pressures. PCWP 15/18 mm Hg with mean 13 mm Hg. LVEDP 14 mm Hg Mild  pulmonary HTN. PAP 41/15, mean 28 mm Hg Preserved cardiac output 5.31 l/min with index 2.85. Plan: recommend continued medical therapy. Does not appear to be volume overloaded. Suspect troponin leak secondary to acute febrile illness.  ECHOCARDIOGRAM COMPLETE  Result Date: 03/19/2022    ECHOCARDIOGRAM REPORT   Patient Name:   Kathleen [name] Date of Exam: 03/19/2022 Medical Rec #:  [id number]       Height:       61.0 in Accession #:    [id number]      Weight:       193.6 lb Date of Birth:  Jun 15, 1945       BSA:          1.863 m Patient Age:    83 years        BP:           106/46 mmHg Patient Gender: F               HR:           77 bpm. Exam Location:  Inpatient Procedure: 2D Echo, 3D Echo, Cardiac Doppler, Color Doppler and Intracardiac            Opacification Agent Indications:    NSTEMI I21.4  History:        Patient has no prior history of Echocardiogram examinations.                 Cardiomyopathy and CHF, Pacemaker, COPD, Arrythmias:Atrial                 Fibrillation and Atrial Flutter; Risk Factors:Hypertension.  Sonographer:    Bernadene Person RDCS Referring Phys: Wendee Beavers, T IMPRESSIONS  1. Left ventricular ejection fraction, by estimation, is 35 to 40%. The left ventricle has moderately decreased function. The left ventricle demonstrates regional wall motion abnormalities (  see scoring diagram/findings for description). Left ventricular  diastolic parameters are consistent with Grade I diastolic dysfunction (impaired relaxation). Elevated left ventricular end-diastolic pressure.  2. Right ventricular systolic function is normal. The right ventricular size is normal. There is mildly elevated pulmonary artery systolic pressure.  3. Right atrial size was mildly dilated.  4. The mitral valve is normal in structure. Mild mitral valve regurgitation. No evidence of mitral stenosis.  5. The aortic valve is tricuspid. There is mild calcification of the aortic valve. Aortic valve regurgitation is not  visualized. No aortic stenosis is present.  6. Aortic dilatation noted. There is mild dilatation of the ascending aorta, measuring 37 mm.  7. The inferior vena cava is dilated in size with >50% respiratory variability, suggesting right atrial pressure of 8 mmHg. FINDINGS  Left Ventricle: Left ventricular ejection fraction, by estimation, is 35 to 40%. The left ventricle has moderately decreased function. The left ventricle demonstrates regional wall motion abnormalities. Definity contrast agent was given IV to delineate the left ventricular endocardial borders. The left ventricular internal cavity size was normal in size. There is no left ventricular hypertrophy. Left ventricular diastolic parameters are consistent with Grade I diastolic dysfunction (impaired relaxation). Elevated left ventricular end-diastolic pressure.  LV Wall Scoring: The entire septum, entire inferior wall, and apex are hypokinetic. The entire anterior wall and entire lateral wall are normal. Right Ventricle: The right ventricular size is normal. No increase in right ventricular wall thickness. Right ventricular systolic function is normal. There is mildly elevated pulmonary artery systolic pressure. The tricuspid regurgitant velocity is 2.72  m/s, and with an assumed right atrial pressure of 8 mmHg, the estimated right ventricular systolic pressure is 123456 mmHg. Left Atrium: Left atrial size was normal in size. Right Atrium: Right atrial size was mildly dilated. Pericardium: There is no evidence of pericardial effusion. Mitral Valve: The mitral valve is normal in structure. Mild mitral valve regurgitation, with posteriorly-directed jet. No evidence of mitral valve stenosis. Tricuspid Valve: The tricuspid valve is normal in structure. Tricuspid valve regurgitation is mild . No evidence of tricuspid stenosis. Aortic Valve: The aortic valve is tricuspid. There is mild calcification of the aortic valve. Aortic valve regurgitation is not  visualized. No aortic stenosis is present. Pulmonic Valve: The pulmonic valve was normal in structure. Pulmonic valve regurgitation is trivial. No evidence of pulmonic stenosis. Aorta: Aortic dilatation noted. There is mild dilatation of the ascending aorta, measuring 37 mm. Venous: The inferior vena cava is dilated in size with greater than 50% respiratory variability, suggesting right atrial pressure of 8 mmHg. IAS/Shunts: No atrial level shunt detected by color flow Doppler.  LEFT VENTRICLE PLAX 2D LVIDd:         5.10 cm     Diastology LVIDs:         4.10 cm     LV e' medial:    5.36 cm/s LV PW:         1.20 cm     LV E/e' medial:  14.3 LV IVS:        1.00 cm     LV e' lateral:   6.41 cm/s LVOT diam:     2.10 cm     LV E/e' lateral: 12.0 LV SV:         62 LV SV Index:   33 LVOT Area:     3.46 cm  3D Volume EF: LV Volumes (MOD)           3D EF:        39 % LV vol d, MOD A2C: 86.2 ml LV EDV:       135 ml LV vol d, MOD A4C: 90.6 ml LV ESV:       83 ml LV vol s, MOD A2C: 54.0 ml LV SV:        52 ml LV vol s, MOD A4C: 61.6 ml LV SV MOD A2C:     32.2 ml LV SV MOD A4C:     90.6 ml LV SV MOD BP:      30.7 ml RIGHT VENTRICLE RV S prime:     11.50 cm/s TAPSE (M-mode): 2.1 cm LEFT ATRIUM             Index        RIGHT ATRIUM           Index LA diam:        3.50 cm 1.88 cm/m   RA Area:     19.30 cm LA Vol (A2C):   64.0 ml 34.36 ml/m  RA Volume:   55.90 ml  30.01 ml/m LA Vol (A4C):   53.3 ml 28.61 ml/m LA Biplane Vol: 58.9 ml 31.62 ml/m  AORTIC VALVE LVOT Vmax:   106.00 cm/s LVOT Vmean:  66.000 cm/s LVOT VTI:    0.180 m  AORTA Ao Asc diam: 3.70 cm MITRAL VALVE               TRICUSPID VALVE MV Area (PHT): 3.99 cm    TR Peak grad:   29.6 mmHg MV Decel Time: 190 msec    TR Vmax:        272.00 cm/s MV E velocity: 76.70 cm/s MV A velocity: 66.00 cm/s  SHUNTS MV E/A ratio:  1.16        Systemic VTI:  0.18 m                            Systemic Diam: 2.10 cm Skeet Latch MD Electronically signed  by Skeet Latch MD Signature Date/Time: 03/19/2022/2:21:27 PM    Final    CT Angio Chest Pulmonary Embolism (PE) W or WO Contrast  Result Date: 03/18/2022 CLINICAL DATA:  High clinical suspicion for PE EXAM: CT ANGIOGRAPHY CHEST WITH CONTRAST TECHNIQUE: Multidetector CT imaging of the chest was performed using the standard protocol during bolus administration of intravenous contrast. Multiplanar CT image reconstructions and MIPs were obtained to evaluate the vascular anatomy. RADIATION DOSE REDUCTION: This exam was performed according to the departmental dose-optimization program which includes automated exposure control, adjustment of the mA and/or kV according to patient size and/or use of iterative reconstruction technique. CONTRAST:  36mL OMNIPAQUE IOHEXOL 350 MG/ML SOLN COMPARISON:  Previous CT done on 09/03/2021, chest radiograph done on 03/17/2022 FINDINGS: Cardiovascular: There are scattered coronary artery calcifications. Pacemaker battery is seen in the left infraclavicular region. Contrast density in the lumen of thoracic aorta is less than adequate to evaluate for dissection. There is ectasia of main pulmonary artery measuring 3.8 cm suggesting pulmonary arterial hypertension. There are no intraluminal filling defects in central pulmonary artery branches. Motion artifacts limit evaluation of small peripheral branches. Mediastinum/Nodes: No significant lymphadenopathy seen. Lungs/Pleura: Centrilobular emphysema is seen. Small linear densities are seen in lower lung fields suggesting scarring or subsegmental atelectasis. There is no focal pulmonary consolidation. There is no pleural  effusion or pneumothorax. There is narrowing of AP diameter of trachea and mainstem bronchi suggesting possible bronchospasm. Upper Abdomen: No acute findings are seen. Musculoskeletal: Degenerative changes are noted in both shoulders. Degenerative changes are noted in lower thoracic spine. Review of the MIP images  confirms the above findings. IMPRESSION: There is no evidence of pulmonary artery embolism. There is ectasia of main pulmonary artery suggesting pulmonary arterial hypertension. Coronary artery calcifications are seen. Centrilobular emphysema. Linear densities in the lower lung fields suggest scarring or subsegmental atelectasis. Electronically Signed   By: Elmer Picker M.D.   On: 03/18/2022 13:09   CT Head Wo Contrast  Result Date: 03/17/2022 CLINICAL DATA:  Mental status change, unknown cause. Fever and weakness. EXAM: CT HEAD WITHOUT CONTRAST TECHNIQUE: Contiguous axial images were obtained from the base of the skull through the vertex without intravenous contrast. RADIATION DOSE REDUCTION: This exam was performed according to the departmental dose-optimization program which includes automated exposure control, adjustment of the mA and/or kV according to patient size and/or use of iterative reconstruction technique. COMPARISON:  10/05/2021. FINDINGS: Brain: No acute intracranial hemorrhage, midline shift or mass effect. No extra-axial fluid collection. Diffuse atrophy is noted. Gray-white matter differentiation is within normal limits. No hydrocephalus. Vascular: No hyperdense vessel or unexpected calcification. Skull: No acute fracture. Sinuses/Orbits: There is partial opacification of the sphenoid sinus on the left and ethmoid air cells on the right. No acute orbital abnormality. Other: None. IMPRESSION: 1. No acute intracranial process. 2. Atrophy. Electronically Signed   By: Brett Fairy M.D.   On: 03/17/2022 20:59   DG Chest Port 1 View  Result Date: 03/17/2022 CLINICAL DATA:  Fever. Weakness. EXAM: PORTABLE CHEST 1 VIEW COMPARISON:  11/19/2021, CT 09/03/2021 FINDINGS: Multi lead left-sided pacemaker in place. Stable cardiomegaly. Unchanged mediastinal contours with aortic atherosclerosis. Chronic hyperinflation. Increased bronchial thickening from prior. Left lung base assessment is  suboptimal due to overlapping soft tissue structures. No large pleural effusion. No pneumothorax. Chronic right shoulder arthropathy. IMPRESSION: 1. Increased bronchial thickening from prior, may be infectious or inflammatory. 2. Chronic hyperinflation.  Unchanged cardiomegaly. Electronically Signed   By: Keith Rake M.D.   On: 03/17/2022 19:08       The results of significant diagnostics from this hospitalization (including imaging, microbiology, ancillary and laboratory) are listed below for reference.     Microbiology: Recent Results (from the past 240 hour(s))  Culture, blood (routine x 2)     Status: None (Preliminary result)   Collection Time: 03/17/22  6:47 PM   Specimen: BLOOD  Result Value Ref Range Status   Specimen Description BLOOD BLOOD LEFT FOREARM  Final   Special Requests   Final    BOTTLES DRAWN AEROBIC AND ANAEROBIC Blood Culture results may not be optimal due to an excessive volume of blood received in culture bottles   Culture   Final    NO GROWTH 4 DAYS Performed at [facility], [place] [address], [place], [place] [address]    Report Status PENDING  Incomplete  Resp Panel by RT-PCR (Flu A&B, Covid)     Status: None   Collection Time: 03/17/22  6:47 PM   Specimen: Nasal Swab  Result Value Ref Range Status   SARS Coronavirus 2 by RT PCR NEGATIVE NEGATIVE Final    Comment: (NOTE) SARS-CoV-2 target nucleic acids are NOT DETECTED.  The SARS-CoV-2 RNA is generally detectable in upper respiratory specimens during the acute phase of infection. The lowest concentration of SARS-CoV-2 viral copies this  assay can detect is 138 copies/mL. A negative result does not preclude SARS-Cov-2 infection and should not be used as the sole basis for treatment or other patient management decisions. A negative result may occur with  improper specimen collection/handling, submission of specimen other than nasopharyngeal swab, presence of viral mutation(s) within the areas  targeted by this assay, and inadequate number of viral copies(<138 copies/mL). A negative result must be combined with clinical observations, patient history, and epidemiological information. The expected result is Negative.  Fact Sheet for Patients:  EntrepreneurPulse.com.au  Fact Sheet for Healthcare Providers:  IncredibleEmployment.be  This test is no t yet approved or cleared by the [place] FDA and  has been authorized for detection and/or diagnosis of SARS-CoV-2 by FDA under an Emergency Use Authorization (EUA). This EUA will remain  in effect (meaning this test can be used) for the duration of the COVID-19 declaration under Section 564(b)(1) of the Act, 21 U.S.C.section 360bbb-3(b)(1), unless the authorization is terminated  or revoked sooner.       Influenza A by PCR NEGATIVE NEGATIVE Final   Influenza B by PCR NEGATIVE NEGATIVE Final    Comment: (NOTE) The Xpert Xpress SARS-CoV-2/FLU/RSV plus assay is intended as an aid in the diagnosis of influenza from Nasopharyngeal swab specimens and should not be used as a sole basis for treatment. Nasal washings and aspirates are unacceptable for Xpert Xpress SARS-CoV-2/FLU/RSV testing.  Fact Sheet for Patients: EntrepreneurPulse.com.au  Fact Sheet for Healthcare Providers: IncredibleEmployment.be  This test is not yet approved or cleared by the [place] FDA and has been authorized for detection and/or diagnosis of SARS-CoV-2 by FDA under an Emergency Use Authorization (EUA). This EUA will remain in effect (meaning this test can be used) for the duration of the COVID-19 declaration under Section 564(b)(1) of the Act, 21 U.S.C. section 360bbb-3(b)(1), unless the authorization is terminated or revoked.  Performed at [facility], [place] [address] [place], [place] [address]   Culture, blood (routine x 2)     Status: None (Preliminary result)    Collection Time: 03/17/22  7:55 PM   Specimen: BLOOD  Result Value Ref Range Status   Specimen Description BLOOD BLOOD RIGHT HAND  Final   Special Requests   Final    BOTTLES DRAWN AEROBIC AND ANAEROBIC Blood Culture adequate volume   Culture   Final    NO GROWTH 4 DAYS Performed at [facility], [place] [address] [place], [place] [address]    Report Status PENDING  Incomplete  Respiratory (~20 pathogens) panel by PCR     Status: None   Collection Time: 03/17/22  9:23 PM   Specimen: Respiratory  Result Value Ref Range Status   Adenovirus NOT DETECTED NOT DETECTED Final   Coronavirus 229E NOT DETECTED NOT DETECTED Final    Comment: (NOTE) The Coronavirus on the Respiratory Panel, DOES NOT test for the novel  Coronavirus (2019 nCoV)    Coronavirus HKU1 NOT DETECTED NOT DETECTED Final   Coronavirus NL63 NOT DETECTED NOT DETECTED Final   Coronavirus OC43 NOT DETECTED NOT DETECTED Final   Metapneumovirus NOT DETECTED NOT DETECTED Final   Rhinovirus / Enterovirus NOT DETECTED NOT DETECTED Final   Influenza A NOT DETECTED NOT DETECTED Final   Influenza B NOT DETECTED NOT DETECTED Final   Parainfluenza Virus 1 NOT DETECTED NOT DETECTED Final   Parainfluenza Virus 2 NOT DETECTED NOT DETECTED Final   Parainfluenza Virus 3 NOT DETECTED NOT DETECTED Final   Parainfluenza Virus  4 NOT DETECTED NOT DETECTED Final   Respiratory Syncytial Virus NOT DETECTED NOT DETECTED Final   Bordetella pertussis NOT DETECTED NOT DETECTED Final   Bordetella Parapertussis NOT DETECTED NOT DETECTED Final   Chlamydophila pneumoniae NOT DETECTED NOT DETECTED Final   Mycoplasma pneumoniae NOT DETECTED NOT DETECTED Final    Comment: Performed at [facility], [place] [address] [place], [place] [address]  MRSA Next Gen by PCR, Nasal     Status: None   Collection Time: 03/19/22 12:03 AM   Specimen: Nasal Mucosa; Nasal Swab  Result Value Ref Range Status   MRSA by PCR Next Gen NOT DETECTED NOT DETECTED  Final    Comment: (NOTE) The GeneXpert MRSA Assay (FDA approved for NASAL specimens only), is one component of a comprehensive MRSA colonization surveillance program. It is not intended to diagnose MRSA infection nor to guide or monitor treatment for MRSA infections. Test performance is not FDA approved in patients less than 104 years old. Performed at [facility], [place] [address], [place], [place] [address]      Labs:  CBC: Recent Labs  Lab 03/17/22 1847 03/19/22 0027 03/20/22 0202 03/20/22 1309 03/20/22 1312 03/20/22 1316 03/21/22 0022 03/22/22 0039  WBC 9.8 7.3 6.8  --   --   --  4.7 8.4  NEUTROABS 9.0*  --   --   --   --   --   --   --   HGB 11.8* 10.1* 10.4* 9.2* 9.5* 9.5* 11.0* 10.4*  HCT 36.8 29.8* 31.7* 27.0* 28.0* 28.0* 33.2* 32.2*  MCV 86.2 82.1 83.6  --   --   --  82.8 83.0  PLT 195 164 177  --   --   --  186 225   BMP &GFR Recent Labs  Lab 03/17/22 1847 03/18/22 0549 03/19/22 0026 03/19/22 0027 03/20/22 0202 03/20/22 1309 03/20/22 1312 03/20/22 1316 03/21/22 0022 03/22/22 0039  NA 134*  --  132*  --  137 137 137 132* 136 138  K 4.1  --  3.2*  --  4.4 4.3 4.3 4.0 4.5 4.2  CL 102  --  102  --  107  --   --   --  108 106  CO2 21*  --  22  --  21*  --   --   --  21* 23  GLUCOSE 147*  --  163*  --  128*  --   --   --  170* 145*  BUN 8  --  9  --  7*  --   --   --  8 13  CREATININE 0.83 0.85 0.75  --  0.61  --   --   --  0.67 0.68  CALCIUM 9.0  --  8.4*  --  8.6*  --   --   --  8.6* 8.7*  MG  --  1.9  --  2.0 2.0  --   --   --  2.1 2.0  PHOS  --   --  1.2*  --   --   --   --   --  1.6* 2.7   Estimated Creatinine Clearance: 62.1 mL/min (by C-G formula based on SCr of 0.68 mg/dL). Liver & Pancreas: Recent Labs  Lab 03/17/22 1847 03/19/22 0026 03/21/22 0022 03/22/22 0039  AST 15  --   --   --   ALT 11  --   --   --   ALKPHOS 31*  --   --   --  BILITOT 0.8  --   --   --   PROT 6.3*  --   --   --   ALBUMIN 3.4* 2.7* 3.0* 3.1*   No results  for input(s): "LIPASE", "AMYLASE" in the last 168 hours. No results for input(s): "AMMONIA" in the last 168 hours. Diabetic: No results for input(s): "HGBA1C" in the last 72 hours. Recent Labs  Lab 03/20/22 0620 03/21/22 0618 03/21/22 1111 03/21/22 1516 03/22/22 0500  GLUCAP 148* 184* 122* 194* 182*   Cardiac Enzymes: No results for input(s): "CKTOTAL", "CKMB", "CKMBINDEX", "TROPONINI" in the last 168 hours. No results for input(s): "PROBNP" in the last 8760 hours. Coagulation Profile: No results for input(s): "INR", "PROTIME" in the last 168 hours. Thyroid Function Tests: No results for input(s): "TSH", "T4TOTAL", "FREET4", "T3FREE", "THYROIDAB" in the last 72 hours. Lipid Profile: No results for input(s): "CHOL", "HDL", "LDLCALC", "TRIG", "CHOLHDL", "LDLDIRECT" in the last 72 hours. Anemia Panel: No results for input(s): "VITAMINB12", "FOLATE", "FERRITIN", "TIBC", "IRON", "RETICCTPCT" in the last 72 hours. Urine analysis:    Component Value Date/Time   COLORURINE YELLOW 03/17/2022 2244   APPEARANCEUR HAZY (A) 03/17/2022 2244   APPEARANCEUR Hazy 12/25/2012 1653   LABSPEC 1.012 03/17/2022 2244   LABSPEC 1.021 12/25/2012 1653   PHURINE 7.0 03/17/2022 2244   GLUCOSEU NEGATIVE 03/17/2022 2244   GLUCOSEU NEGATIVE 08/07/2021 1219   HGBUR NEGATIVE 03/17/2022 2244   BILIRUBINUR NEGATIVE 03/17/2022 2244   BILIRUBINUR Negative 12/25/2012 [place] 03/17/2022 2244   PROTEINUR NEGATIVE 03/17/2022 2244   UROBILINOGEN 1.0 08/07/2021 1219   NITRITE NEGATIVE 03/17/2022 2244   LEUKOCYTESUR LARGE (A) 03/17/2022 2244   LEUKOCYTESUR Negative 12/25/2012 1653   Sepsis Labs: Invalid input(s): "PROCALCITONIN", "LACTICIDVEN"   SIGNED:  Mercy Riding, MD  [facility] 03/22/2022, 2:28 PM

## 2022-03-22 NOTE — Progress Notes (Signed)
Pt got discharged to home, discharge instructions provided and patient showed understanding to it, IV taken out,Telemonitor DC,pt left unit in wheelchair with all of the belongings accompanied with a family member (Son)  Teneshia Hedeen, RN 

## 2022-03-22 NOTE — Plan of Care (Signed)
  Problem: Education: Goal: Knowledge of General Education information will improve Description: Including pain rating scale, medication(s)/side effects and non-pharmacologic comfort measures Outcome: Completed/Met   Problem: Health Behavior/Discharge Planning: Goal: Ability to manage health-related needs will improve Outcome: Completed/Met   Problem: Clinical Measurements: Goal: Ability to maintain clinical measurements within normal limits will improve Outcome: Completed/Met Goal: Will remain free from infection Outcome: Completed/Met Goal: Diagnostic test results will improve Outcome: Completed/Met Goal: Respiratory complications will improve Outcome: Completed/Met Goal: Cardiovascular complication will be avoided Outcome: Completed/Met   Problem: Activity: Goal: Risk for activity intolerance will decrease Outcome: Completed/Met   Problem: Nutrition: Goal: Adequate nutrition will be maintained Outcome: Completed/Met   Problem: Coping: Goal: Level of anxiety will decrease Outcome: Completed/Met   Problem: Elimination: Goal: Will not experience complications related to bowel motility Outcome: Completed/Met Goal: Will not experience complications related to urinary retention Outcome: Completed/Met   Problem: Pain Managment: Goal: General experience of comfort will improve Outcome: Completed/Met   Problem: Safety: Goal: Ability to remain free from injury will improve Outcome: Completed/Met   Problem: Skin Integrity: Goal: Risk for impaired skin integrity will decrease Outcome: Completed/Met   Problem: Acute Rehab OT Goals (only OT should resolve) Goal: Pt. Will Perform Upper Body Dressing Outcome: Completed/Met Goal: Pt. Will Perform Lower Body Dressing Outcome: Completed/Met Goal: Pt. Will Transfer To Toilet Outcome: Completed/Met Goal: Pt. Will Perform Toileting-Clothing Manipulation Outcome: Completed/Met   Problem: Education: Goal: Understanding of CV  disease, CV risk reduction, and recovery process will improve Outcome: Completed/Met Goal: Individualized Educational Video(s) Outcome: Completed/Met   Problem: Activity: Goal: Ability to return to baseline activity level will improve Outcome: Completed/Met   Problem: Cardiovascular: Goal: Ability to achieve and maintain adequate cardiovascular perfusion will improve Outcome: Completed/Met Goal: Vascular access site(s) Level 0-1 will be maintained Outcome: Completed/Met   Problem: Health Behavior/Discharge Planning: Goal: Ability to safely manage health-related needs after discharge will improve Outcome: Completed/Met   Problem: Acute Rehab PT Goals(only PT should resolve) Goal: Patient Will Transfer Sit To/From Stand Outcome: Completed/Met Goal: Pt Will Ambulate Outcome: Completed/Met Goal: Pt/caregiver will Perform Home Exercise Program Outcome: Completed/Met   Problem: Education: Goal: Knowledge of cardiac device and self-care will improve Outcome: Completed/Met Goal: Ability to safely manage health related needs after discharge will improve Outcome: Completed/Met Goal: Individualized Educational Video(s) Outcome: Completed/Met   Problem: Cardiac: Goal: Ability to achieve and maintain adequate cardiopulmonary perfusion will improve Outcome: Completed/Met

## 2022-03-22 NOTE — TOC Initial Note (Signed)
Transition of Care [facility]) - Initial/Assessment Note    Patient Details  Name: Kathleen [name] MRN: [id number] Date of Birth: 11-10-1945  Transition of Care [facility]) CM/SW Contact:    Charnice Zwilling G., RN Phone Number: 03/22/2022, 9:46 AM  Clinical Narrative:                  Kathleen [name] is a 76 y.o. female with medical history significant of extensive cardiovascular history including atypical atrial flutter, complete heart block status post pacemaker, nonischemic cardiomyopathy, chronic diastolic heart failure with preserved ejection fraction, obesity, essential hypertension, gout, chronic hypoxemic respiratory failure on 4 L oxygen secondary to [place], on noninvasive ventilator at home but not using who received flu vaccine , since then started feeling body ache and fever, she was very tired and fatigued.  She felt mildly confused.  She called her son and she sounded like she is having a stroke as she could not talk and sounded confused so EMS was activated and she was brought to the ER.  Patient tells me that she did fairly well until her flu shot.  She does have chronic cough and has been slightly worsening last few days.  No sputum production.  No sick contacts.  Not needed more than usual oxygen.  Her temperature was 103 at her apartment, once her temperature came down she felt pretty well. ED Course: Temperature 103, blood pressure stable, WC count 9.8.  On 4 L oxygen.  Chest x-ray with bilateral lower lobe opacity, mostly chronic findings.  CT head normal.  Blood cultures were drawn.  Patient was given 1 dose of vancomycin and cefepime COVID-19 influenza swab negative.  Requested admission due to severity of symptoms.  RNCM received order for HHPT and OT.  RNCM spoke to patient and offered choice for [facility] services.  Patient stated she has used [place] [facility] services before and would like to use them again.  Georgina Snell at [facility] contacted with orders and accepted for services.  Patient in  agreement.  Expected Discharge Plan: [place] Barriers to Discharge: No Barriers Identified  Patient Goals and CMS Choice Patient states their goals for this hospitalization and ongoing recovery are:: to return to her home CMS Medicare.gov Compare Post Acute Care list provided to:: Patient Choice offered to / list presented to : Patient  Expected Discharge Plan and Services Expected Discharge Plan: [place]   Discharge Planning Services: CM Consult Post Acute Care Choice: [place] arrangements for the past 2 months: Apartment Expected Discharge Date: 03/22/22               DME Arranged: N/A DME Agency: NA       HH Arranged: PT, OT HH Agency: [place] Date [facility] Agency Contacted: 03/22/22 Time [place]: 0945 Representative spoke with at [place]: Georgina Snell  Prior Living Arrangements/Services Living arrangements for the past 2 months: Apartment Lives with:: Self Patient language and need for interpreter reviewed:: Yes Do you feel safe going back to the place where you live?: Yes      Need for Family Participation in Patient Care: No (Comment) Care giver support system in place?: Yes (comment) Current home services: Other (comment) (none) Criminal Activity/Legal Involvement Pertinent to Current Situation/Hospitalization: No - Comment as needed  Activities of Daily Living Home Assistive Devices/Equipment: Nebulizer, Oxygen, Walker (specify type), Cane (specify quad or straight) (Rollator) ADL Screening (condition at time of admission) Patient's cognitive ability adequate  to safely complete daily activities?: Yes Is the patient deaf or have difficulty hearing?: No Does the patient have difficulty seeing, even when wearing glasses/contacts?: No Does the patient have difficulty concentrating, remembering, or making decisions?: No Patient able to express need for assistance with ADLs?: Yes Does the patient have  difficulty dressing or bathing?: No Independently performs ADLs?: Yes (appropriate for developmental age) Does the patient have difficulty walking or climbing stairs?: Yes Weakness of Legs: Both Weakness of Arms/Hands: None  Permission Sought/Granted                 Emotional Assessment Appearance:: Appears stated age Attitude/Demeanor/Rapport: Gracious Affect (typically observed): Accepting Orientation: : Oriented to Self, Oriented to Place, Oriented to  Time, Oriented to Situation   Psych Involvement: No (comment)  Admission diagnosis:  Sepsis ([place]) [A41.9] Fever, unspecified fever cause [R50.9] Patient Active Problem List   Diagnosis Date Noted   Acute on chronic combined systolic and diastolic CHF (congestive heart failure) ([place]) 03/19/2022   Non-STEMI (non-ST elevated myocardial infarction) ([place]) 03/18/2022   Hypotension 03/18/2022   Morbid obesity ([place]) 03/18/2022   Severe sepsis ([place]) 03/17/2022   Acute conjunctivitis of right eye 03/03/2022   COPD with acute exacerbation ([place]) 12/03/2021   Low TSH level 12/02/2021   Acute idiopathic gout of left hand 11/19/2021   Acquired hypothyroidism 11/12/2021   Encounter for palliative care involving management of pain 11/12/2021   Post concussive syndrome 11/11/2021   Chronic left-sided low back pain 11/04/2021   Gout 10/01/2021   Hyperlipidemia with target LDL less than 100 08/07/2021   Chronic anticoagulation 07/07/2021   Stage 3a chronic kidney disease ([place]) 05/23/2021   Dietary iron deficiency 05/23/2021   Age-related osteoporosis without current pathological fracture 04/22/2021   Type 2 diabetes mellitus with diabetic neuropathy, without long-term current use of insulin ([place]) 04/22/2021   Sleep apnea 01/22/2021   Statin intolerance 10/12/2019   Statin myopathy 02/24/2019   HTN (hypertension) 04/06/2018   Atrial fibrillation ([place]) 04/06/2018   Chronic respiratory failure with hypoxia ([place]) 03/28/2018   Cardiac  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 12/02/2015   Malnutrition of moderate degree 09/11/2015   PCP:  [name], MD Pharmacy:   [facility] [place], [place] - [address] AT [place] [address] Phone: [phone] Fax: [phone]  [place], [place] [place] [place] [address] Phone: [phone] Fax: [phone]  Social Determinants of Health (SDOH) Interventions   Readmission Risk Interventions    06/28/2019    3:42 PM  Readmission Risk Prevention Plan  Transportation Screening Complete  PCP or Specialist Appt within 3-5 Days Complete  HRI or Home Care Consult Complete  Palliative Care Screening Not Applicable  Medication Review (RN Care Manager) Complete

## 2022-03-22 NOTE — Progress Notes (Signed)
Ordered echo to be scheduled in 6 weeks per Dr. Margaretann Loveless with appt with AHF clinic right after.   Triage, can you please help me with scheduling the echo and AHF appt? She see Dr. Haroldine Laws.  Thanks Angie

## 2022-03-23 ENCOUNTER — Telehealth: Payer: Self-pay

## 2022-03-23 ENCOUNTER — Encounter (HOSPITAL_COMMUNITY): Payer: Self-pay | Admitting: Cardiology

## 2022-03-23 NOTE — Telephone Encounter (Signed)
Transition Care Management Follow-up Telephone Call Date of discharge and from where: [facility] 03/22/2022 How have you been since you were released from the hospital? weakness Any questions or concerns? No  Items Reviewed: Did the pt receive and understand the discharge instructions provided? Yes  Medications obtained and verified? Yes  Other? Yes  Any new allergies since your discharge? Yes  Dietary orders reviewed? No Do you have support at home? Yes   Home Care and Equipment/Supplies: Were home health services ordered? yes If so, what is the name of the agency? [facility]  Has the agency set up a time to come to the patient's home? no Were any new equipment or medical supplies ordered?  No What is the name of the medical supply agency? N/a Were you able to get the supplies/equipment? no Do you have any questions related to the use of the equipment or supplies? No  Functional Questionnaire: (I = Independent and D = Dependent) ADLs: I  Bathing/Dressing- I  Meal Prep- I  Eating- I  Maintaining continence- I  Transferring/Ambulation- I  Managing Meds- I  Follow up appointments reviewed:  PCP Hospital f/u appt confirmed? Yes  Scheduled to see Harland Dingwall on 03/25/2022 @ 1:20. [place] [facility] f/u appt confirmed? No   Are transportation arrangements needed? No  If their condition worsens, is the pt aware to call PCP or go to the Emergency Dept.? Yes Was the patient provided with contact information for the PCP's office or ED? Yes Was to pt encouraged to call back with questions or concerns? Yes

## 2022-03-25 ENCOUNTER — Encounter: Payer: Self-pay | Admitting: Family Medicine

## 2022-03-25 ENCOUNTER — Ambulatory Visit (INDEPENDENT_AMBULATORY_CARE_PROVIDER_SITE_OTHER): Payer: Medicare Other | Admitting: Family Medicine

## 2022-03-25 VITALS — BP 136/66 | HR 75 | Temp 97.6°F | Ht 61.0 in | Wt 194.0 lb

## 2022-03-25 DIAGNOSIS — E1140 Type 2 diabetes mellitus with diabetic neuropathy, unspecified: Secondary | ICD-10-CM | POA: Diagnosis not present

## 2022-03-25 DIAGNOSIS — Z7901 Long term (current) use of anticoagulants: Secondary | ICD-10-CM

## 2022-03-25 DIAGNOSIS — I10 Essential (primary) hypertension: Secondary | ICD-10-CM

## 2022-03-25 DIAGNOSIS — Z789 Other specified health status: Secondary | ICD-10-CM

## 2022-03-25 DIAGNOSIS — Z8619 Personal history of other infectious and parasitic diseases: Secondary | ICD-10-CM | POA: Diagnosis not present

## 2022-03-25 DIAGNOSIS — E785 Hyperlipidemia, unspecified: Secondary | ICD-10-CM | POA: Diagnosis not present

## 2022-03-25 DIAGNOSIS — Z09 Encounter for follow-up examination after completed treatment for conditions other than malignant neoplasm: Secondary | ICD-10-CM | POA: Diagnosis not present

## 2022-03-25 DIAGNOSIS — J9611 Chronic respiratory failure with hypoxia: Secondary | ICD-10-CM

## 2022-03-25 DIAGNOSIS — Z950 Presence of cardiac pacemaker: Secondary | ICD-10-CM

## 2022-03-25 MED ORDER — NEXLETOL 180 MG PO TABS: 1.0000 | ORAL_TABLET | Freq: Every day | ORAL | 0 refills | Status: DC

## 2022-03-25 NOTE — Patient Instructions (Signed)
Please call your pulmonology office to schedule an appointment and let them know that are unable to get the inhalers you were prescribed.   Follow up with your cardiologist and Dr. Ronnald Ramp as scheduled.   Let us know if you need anything.

## 2022-03-25 NOTE — Progress Notes (Signed)
Subjective:     Patient ID: Kathleen [name], female    DOB: 1945-08-19, 76 y.o.   MRN: [id number]  Chief Complaint  Patient presents with   Hospitalization Follow-up    DC date: 03/22/22    HPI Patient is in today for hospital discharge follow up. Here with her son. She usually sees Dr. Ronnald Ramp who is her PCP but seeing me today for hospital discharge follow up.   Hospitalization from 03/17/2022- 03/22/2022 for severe sepsis likely due to pneumonia. Completed antibiotics prior to d/c home.   She has COPD and had an acute exacerbation. Oxygen dependent.  Does not have a follow up with pulmonologist for recent hospitalization. She will call and schedule.  Dr. Elsworth Soho is her pulmonologist States she does not have Duoneb or Advair yet.   Rheumatologist - Dr. Benjamine Mola    Palliative care nurse is schedule to come out soon.    Non-STEMI-  TTE with LVEF of 35-40%   Needs close f/u with cardiologist. She does go to the A-fib clinic on Xarelto and Tikosyn. In sinus rhythm.  Taking losartan, Aldactone and  for CHF  She has also been dealing with Vertigo  Getting home PT.   She is seeing a neurologist.  Hx of concussion in April 2023 from a fall.    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    Concussion 10/04/2021   COPD (chronic obstructive pulmonary disease) (HCC)    Gout    Hypertension    Longstanding persistent atrial  fibrillation (HCC)    Nonischemic cardiomyopathy ([place])    Obesity    Pacemaker    Spontaneous pneumothorax 2013    Past Surgical History:  Procedure Laterality Date   ABDOMINAL HYSTERECTOMY     APPENDECTOMY     ATRIAL FIBRILLATION ABLATION  07/20/2013   by Dr Westley Gambles   AV nodal ablation  11/01/2013   by Dr Westley Gambles, repeated by Dr Dwana Curd   BREAST BIOPSY Bilateral 1997   negative   CARDIAC CATHETERIZATION     CHOLECYSTECTOMY     HERNIA REPAIR     PACEMAKER INSERTION  06/2017   MDT Viva CRT-P implanted by Dr Westley Gambles after AV nodal ablation,  LV lead could not be placed   PACEMAKER INSERTION  05/2020   with lead bundle   RIGHT/LEFT HEART CATH AND CORONARY ANGIOGRAPHY N/A 03/20/2022   Procedure: RIGHT/LEFT HEART CATH AND CORONARY ANGIOGRAPHY;  Surgeon: Martinique, Peter M, MD;  Location: [facility];  Service: Cardiovascular;  Laterality: N/A;    Family History  Problem Relation Age of Onset   Breast cancer Mother 5       3 different times   Pancreatic cancer Mother    Emphysema Father    Healthy Brother    Breast cancer Cousin    Valvular heart disease Son  Obesity Daughter    Colon cancer Neg Hx    Esophageal cancer Neg Hx    Stomach cancer Neg Hx    Inflammatory bowel disease Neg Hx    Liver disease Neg Hx    Rectal cancer Neg Hx     Social History   Socioeconomic History   Marital status: Widowed    Spouse name: Not on file   Number of children: Not on file   Years of education: Not on file   Highest education level: Not on file  Occupational History   Occupation: retired  Tobacco Use   Smoking status: Former    Packs/day: 1.00    Years: 20.00    Total pack years: 20.00    Types: Cigarettes    Quit date: 06/09/1987    Years since quitting: 34.8    Passive exposure: Past   Smokeless tobacco: Never   Tobacco comments:    Former smoker 12/25/21  Vaping Use   Vaping Use: Never used  Substance and Sexual Activity   Alcohol use: No    Alcohol/week:  0.0 standard drinks of alcohol   Drug use: No   Sexual activity: Not Currently  Other Topics Concern   Not on file  Social History Narrative   Pt lives in [place] alone.  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  Her son works in [employer].   Social Determinants of Health   Financial Resource Strain: Low Risk  (07/26/2018)   Overall Financial Resource Strain (CARDIA)    Difficulty of Paying Living Expenses: Not hard at all  Food Insecurity: No Food Insecurity (07/26/2018)   Hunger Vital Sign    Worried About Running Out of Food in the Last Year: Never true    Ran Out of Food in the Last Year: Never true  Transportation Needs: No Transportation Needs (07/26/2018)   PRAPARE - Hydrologist (Medical): No    Lack of Transportation (Non-Medical): No  Physical Activity: Insufficiently Active (07/26/2018)   Exercise Vital Sign    Days of Exercise per Week: 2 days    Minutes of Exercise per Session: 20 min  Stress: Stress Concern Present (07/26/2018)   [place]    Feeling of Stress : Rather much  Social Connections: Socially Isolated (07/26/2018)   Social Connection and Isolation Panel [NHANES]    Frequency of Communication with Friends and Family: Once a week    Frequency of Social Gatherings with Friends and Family: Once a week    Attends Religious Services: Never    Marine scientist or Organizations: No    Attends Archivist Meetings: Never    Marital Status: Widowed  Intimate Partner Violence: Not At Risk (07/26/2018)   Humiliation, Afraid, Rape, and Kick questionnaire    Fear of Current or Ex-Partner: No    Emotionally Abused: No    Physically Abused: No    Sexually Abused: No    Outpatient Medications Prior to Visit  Medication Sig Dispense Refill   acetaminophen (TYLENOL) 650 MG CR tablet Take 1,300 mg by mouth as needed for pain.     azelastine (ASTELIN)  0.1 % nasal spray Place 2 sprays into both nostrils 2 (two) times daily. 30 mL 5   Cholecalciferol (VITAMIN D3) 50 MCG (2000 UT) TABS Take 2,000 Units by mouth at bedtime.     cyanocobalamin (,VITAMIN B-12,) 1000 MCG/ML injection Inject 1 mL (  1,000 mcg total) into the muscle every 30 (thirty) days. 10 mL 0   docusate sodium (COLACE) 100 MG capsule Take 100 mg by mouth 2 (two) times daily.     dofetilide (TIKOSYN) 250 MCG capsule Take 1 capsule (250 mcg total) by mouth in the morning and at bedtime. 60 capsule 4   febuxostat (ULORIC) 40 MG tablet TAKE 1 TABLET(40 MG) BY MOUTH EVERY NIGHT (Patient taking differently: Take 40 mg by mouth at bedtime.) 90 tablet 0   fluticasone-salmeterol (ADVAIR HFA) 230-21 MCG/ACT inhaler Inhale 2 puffs into the lungs daily.     ipratropium-albuterol (DUONEB) 0.5-2.5 (3) MG/3ML SOLN Take 3 mLs by nebulization every 6 (six) hours as needed (Shortness of breath, cough and/or wheeze). 360 mL 2   linaclotide (LINZESS) 145 MCG CAPS capsule Take 145 mcg by mouth daily as needed (constipation).     losartan (COZAAR) 25 MG tablet TAKE 1 TABLET(25 MG) BY MOUTH AT BEDTIME (Patient taking differently: Take 25 mg by mouth at bedtime.) 90 tablet 3   Magnesium 500 MG TABS Take 500 mg by mouth at bedtime.     mupirocin ointment (BACTROBAN) 2 % Apply 1 application. topically 2 (two) times daily. (Patient taking differently: Apply 1 application  topically 2 (two) times daily as needed (wound care).) 30 g 2   nystatin cream (MYCOSTATIN) Apply to affected area 2 times daily (Patient taking differently: Apply 1 Application topically 2 (two) times daily as needed for dry skin.) 90 g 1   OXYGEN Inhale 4.5 L into the lungs continuous. Use with resmed ventilator     predniSONE (DELTASONE) 10 MG tablet Take 4 tablets (40 mg total) by mouth daily for 2 days. 8 tablet 0   rivaroxaban (XARELTO) 20 MG TABS tablet Take 1 tablet (20 mg total) by mouth daily with supper. 30 tablet 5   spironolactone  (ALDACTONE) 25 MG tablet TAKE 1 TABLET(25 MG) BY MOUTH EVERY MORNING (Patient taking differently: Take 25 mg by mouth daily.) 90 tablet 3   torsemide (DEMADEX) 20 MG tablet Take 20mg  (1 Tab) Monday and Friday (Patient taking differently: Take 20 mg by mouth 2 (two) times a week. Take 20mg  (1 Tab) Monday and Friday) 60 tablet 3   traMADol (ULTRAM) 50 MG tablet Take 50 mg by mouth every 6 (six) hours as needed for pain.     YUPELRI 175 MCG/3ML nebulizer solution USE 1 VIAL IN NEBULIZER DAILY - DO NOT MIX WITH OTHER NEB MEDS,USE BEFORE OR AFTER (Patient taking differently: Take 175 mcg by nebulization daily.) 30 mL 11   Bempedoic Acid (NEXLETOL) 180 MG TABS Take 1 tablet by mouth daily. 90 tablet 1   amitriptyline (ELAVIL) 25 MG tablet Take 1 tablet (25 mg total) by mouth at bedtime. (Patient not taking: Reported on 03/25/2022) 30 tablet 3   benzonatate (TESSALON) 100 MG capsule Take 1 capsule (100 mg total) by mouth 3 (three) times daily. (Patient not taking: Reported on 03/25/2022) 20 capsule 0   No facility-administered medications prior to visit.    Allergies  Allergen Reactions   Allopurinol Other (See Comments)    Reaction:  Dizziness    Clindamycin Anaphylaxis and Hives   Flublok [Influenza Vaccine Recombinant] Other (See Comments)    Fever 103 with no alternative explanation day after vaccine.  Santiago Glad Highfill FNP-C   Pneumococcal 13-Val Conj Vacc Itching, Swelling and Rash   Dronedarone Rash   Brovana [Arformoterol]     Caused muscle pain   Budesonide  Caused extreme joint pain   Entresto [Sacubitril-Valsartan] Other (See Comments)    hypotension   Fosamax [Alendronate Sodium] Nausea Only   Jardiance [Empagliflozin]     Caused a vaginal infection   Meperidine Nausea And Vomiting   Microplegia Msa-Msg [Plegisol]     Loopy,diarrhea   Rosuvastatin Other (See Comments)    Reaction:  Muscle spasms    Tetracycline Hives   Adhesive [Tape] Rash   Lovastatin Rash    Other  reaction(s): Muscle Pain    ROS     Objective:    Physical Exam Constitutional:      General: She is not in acute distress.    Appearance: She is ill-appearing.  Cardiovascular:     Rate and Rhythm: Normal rate and regular rhythm.  Pulmonary:     Effort: Pulmonary effort is normal.     Breath sounds: Normal breath sounds.  Musculoskeletal:     Right lower leg: No edema.     Left lower leg: No edema.  Skin:    General: Skin is warm and dry.  Neurological:     Mental Status: She is alert and oriented to person, place, and time.  Psychiatric:        Mood and Affect: Mood normal.        Behavior: Behavior normal.     BP 136/66 (BP Location: Left Wrist, Patient Position: Sitting, Cuff Size: Large)   Pulse 75   Temp 97.6 F (36.4 C) (Temporal)   Ht 5\' 1"  (1.549 m)   Wt 194 lb (88 kg)   SpO2 95% Comment: oxygen  BMI 36.66 kg/m  Wt Readings from Last 3 Encounters:  03/25/22 194 lb (88 kg)  03/22/22 198 lb 6.6 oz (90 kg)  03/12/22 194 lb (88 kg)       Assessment & Plan:   Problem List Items Addressed This Visit       Cardiovascular and Mediastinum   HTN (hypertension) (Chronic)    BP in goal range today. Continue current medications. Follow up with cardiologist.       Relevant Medications   Bempedoic Acid (NEXLETOL) 180 MG TABS     Respiratory   Chronic respiratory failure with hypoxia (HCC)    Continue oxygen and medications recommend by Dr. Elsworth Soho. Schedule follow up with Dr. Elsworth Soho due to recent hospitalization.         Endocrine   Type 2 diabetes mellitus with diabetic neuropathy, without long-term current use of insulin (HCC)     Other   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   Chronic anticoagulation    Continue therapy      History of severe sepsis   Hospital discharge follow-up - Primary    In depth review of hospitalization notes and results. Medications reviewed.  She will need to reach out to her pulmonologist regarding  inhaler and nebulizer concerns. Continue oxygen at home. Continue PT. Palliative care nurse scheduled for visit.  Her son is helping to care for her and with her today.  Follow up with PCP and other specialists as recommended.       Hyperlipidemia with target LDL less than 100   Relevant Medications   Bempedoic Acid (NEXLETOL) 180 MG TABS   Statin intolerance    I am having Franne Grip. Julieanne Manson maintain her Magnesium, Vitamin D3, docusate sodium, Yupelri, OXYGEN, acetaminophen, torsemide, nystatin cream, mupirocin ointment, losartan, dofetilide, spironolactone, cyanocobalamin, rivaroxaban, azelastine, febuxostat, amitriptyline, traMADol, linaclotide, fluticasone-salmeterol, benzonatate, ipratropium-albuterol, and Nexletol.  Meds ordered  this encounter  Medications   Bempedoic Acid (NEXLETOL) 180 MG TABS    Sig: Take 1 tablet by mouth daily.    Dispense:  90 tablet    Refill:  0    Order Specific Question:   Supervising Provider    Answer:   Pricilla Holm A [id number]

## 2022-03-27 ENCOUNTER — Telehealth: Payer: Self-pay | Admitting: Internal Medicine

## 2022-03-27 NOTE — Telephone Encounter (Signed)
Verbal orders given to [facility] for PT for 2 x a week for 2 weeks, then 1 x a week for 6 weeks.

## 2022-03-27 NOTE — Telephone Encounter (Signed)
Kathleen [name] is needing verbal orders for Physical therapy 2 x a week for 2 weeks, then 1 x a week for 6 weeks.

## 2022-03-31 ENCOUNTER — Encounter: Payer: Self-pay | Admitting: Family Medicine

## 2022-03-31 ENCOUNTER — Other Ambulatory Visit: Payer: Medicare Other | Admitting: Family Medicine

## 2022-03-31 VITALS — HR 67 | Resp 20

## 2022-03-31 DIAGNOSIS — F0781 Postconcussional syndrome: Secondary | ICD-10-CM

## 2022-03-31 DIAGNOSIS — I214 Non-ST elevation (NSTEMI) myocardial infarction: Secondary | ICD-10-CM

## 2022-03-31 DIAGNOSIS — I5043 Acute on chronic combined systolic (congestive) and diastolic (congestive) heart failure: Secondary | ICD-10-CM

## 2022-03-31 DIAGNOSIS — R079 Chest pain, unspecified: Secondary | ICD-10-CM

## 2022-03-31 NOTE — Assessment & Plan Note (Signed)
In depth review of hospitalization notes and results. Medications reviewed.  She will need to reach out to her pulmonologist regarding inhaler and nebulizer concerns. Continue oxygen at home. Continue PT. Palliative care nurse scheduled for visit.  Her son is helping to care for her and with her today.  Follow up with PCP and other specialists as recommended.

## 2022-03-31 NOTE — Assessment & Plan Note (Signed)
Continue oxygen and medications recommend by Dr. Elsworth Soho. Schedule follow up with Dr. Elsworth Soho due to recent hospitalization.

## 2022-03-31 NOTE — Progress Notes (Signed)
[facility] Consult Note Telephone: [phone]  Fax: [phone]    Date of encounter: 03/31/22 11:19 AM PATIENT NAME: Kathleen [name] [address] [place] [address]   [phone] (home)  DOB: 10/12/1945 MRN: [id number] PRIMARY CARE PROVIDER:    [name], MD,  [place] [address] [phone]  REFERRING PROVIDER:   [name], MD [address] [place],   [address] [phone]  RESPONSIBLE PARTY:    Contact Information     Name Relation Home Work Kathleen [name] [phone]  [phone]   Kathleen, [name] [phone]  [phone]   Kathleen [name] Daughter   [phone]        I met [place] face [place]. Palliative Care was asked to follow this patient by consultation request of  Kathleen Lima, MD to address advance care planning and complex medical decision making. This is a follow up visit.  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 Continue daily weight, if weight is increased by 3 lbs/24 hrs or 5 lbs in 5 days she needs to take an extra Torsemide 20 mg tablet on Wed in addition to daily dose that is given on Monday and Friday. NTG 0.4 mg SL, #25 prescribed.  Take 1 tab under the tongue Q 5 min x 3 if having chest pain, by second tab call 911.  Sent message to Cardiologist to confirm.  2.   Chronic Respiratory Failure/COPD exacerbation Continues to have significant cough, has completed Prednisone taper from the hospital. Completed antibiotics while inpatient. Continue O2@ 4L/min,  monitor for worsening cough, SOB or sputum production  4.  Post concussive syndrome Continued headache, intermittent  vertigo. Seen by Neuro and recommended to take Tylenol or Aleve no more than 3 days per week for abortive therapy. Neurologist started her on Amitriptylline for preventive therapy Can take abortive therapy at start of headache, decrease visual and noise stimulation.  5.   Acute conjunctivitis vs blood vessel rupture Resolved.      Advance Care Planning/Goals of Care: Goals include to maximize quality of life and symptom management.  CODE STATUS: Full Code at present   This visit was coded based on medical decision making (MDM).  PPS: 60%  HOSPICE ELIGIBILITY/DIAGNOSIS: TBD  Chief Complaint:  Palliative Care is following for chronic medical management of pt with chronic respiratory failure with recent FUO, COPD exacerbation with acute on chronic respiratory failure, acute on chronic heart failure and NSTEMI.  HISTORY OF PRESENT ILLNESS:  Kathleen [name] is a 76 y.o. 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  She is currently on O2 at 4.5 L/min. She had appt with [facility] on 03/12/22. She received the flu shot on 03/15/22 and had a severe reaction on 03/16/22 with fever to 103, slurred speech and encephalopathy. Had NSTEMI with highly elevated troponin, cardiomyopathy. Since hospitalization she is getting PT at home.  Had CP last night with feeling of "heavy weight on my chest" and had pain referring to her jaw which lasted for awhile. Had pain at rest and lasted a while. Denies nausea and vomiting from last night but has had nausea. Amitryptyline given by Neurologist and helps her sleep  with hangover effect but being used for headache prevention. Has treated with Delene Loll previously but had a bad reaction. She was treated  in the hospital with Ceftriaxone and Doxycycline.      History obtained from review of EMR, discussion with Kathleen [name].  03/19/22 TTE with LVEF of 35 to 40%, RWMA, G1-DD.  Patient was started on IV heparin.  Underwent R/LHC on 10/13 that showed mild nonobstructive CAD and mild PHTN.  CTA chest negative for PE or pneumonia.  COVID-19, influenza PCR full RVP panel negative.  Blood cultures NGTD.  MRSA PCR negative  I reviewed  EMR and there are no available labs, imaging, studies or related documents since last visit.   ROS General: NAD EYES: denies vision changes Cardiovascular: denies chest pain, endorses DOE Pulmonary: Increased cough productive of yellow mucous Abdomen: endorses poor appetite, endorses continence of bowel MSK:  endorses myalgias widespread, no falls reported Neurological: endorses pain in low back which she states not significantly better, endorses headache and daily dizziness, denies insomnia Psych: Endorses positive mood   Physical Exam: Current and past weights: 194 lbs on 03/25/22 Constitutional: NAD General: WD, obese  Eye:  Injection of right sclera at outer canthus CV: S1S2, RRR, 1+ L pedal edema. No right temporal bruit Pulmonary: CTAB with coarse dry cough, on 4.5 L O2 per [place] Abdomen: normo-active BS + 4 quadrants, soft and non tender MSK: ambulatory with rollator, Neuro:  Psych: tangential thought process, pressured speech. A and O x 3    Thank you for the opportunity to participate in the care of Kathleen [name].  The palliative care team will continue to follow. Please call our office at [phone] if we can be of additional assistance.   Marijo Conception, FNP-C  COVID-19 PATIENT SCREENING TOOL Asked and negative response unless otherwise noted:   Have you had symptoms of covid, tested positive or been in  contact with someone with symptoms/positive test in the past 5-10 days?  No

## 2022-03-31 NOTE — Assessment & Plan Note (Signed)
Continue therapy

## 2022-03-31 NOTE — Assessment & Plan Note (Signed)
BP in goal range today. Continue current medications. Follow up with cardiologist.

## 2022-04-01 ENCOUNTER — Telehealth: Payer: Self-pay | Admitting: Family Medicine

## 2022-04-01 ENCOUNTER — Ambulatory Visit (INDEPENDENT_AMBULATORY_CARE_PROVIDER_SITE_OTHER): Payer: Medicare Other

## 2022-04-01 DIAGNOSIS — I480 Paroxysmal atrial fibrillation: Secondary | ICD-10-CM | POA: Diagnosis not present

## 2022-04-01 NOTE — Telephone Encounter (Signed)
After receiving message from Dr Peter Martinique regarding the lack of significant blockage on recent heart cath that he thought pt needed conservative management for chest pain. Sent the following My Chart message to pt: Kathleen [name], Received a response from Dr Martinique last night around 9 pm.  He states that since you didn't have significant blockage he didn't think you would need the nitroglycerin but just to monitor symptoms.  I think that if you have the pain and it persists like it did the other night you should call Dr Doug Sou office for further instruction.  I think they will tell you to go to the ER and if you have to go because of chest pain I would recommend calling an ambulance because they will be able to get you on the monitor and if you are having EKG changes will get you seen in the ER quicker. Sorry for the confusion. Hope you had a better night last night. Damaris Hippo FNP-C

## 2022-04-02 ENCOUNTER — Telehealth (HOSPITAL_COMMUNITY): Payer: Self-pay | Admitting: Internal Medicine

## 2022-04-02 LAB — CUP PACEART REMOTE DEVICE CHECK
Battery Remaining Longevity: 94 mo
Battery Voltage: 2.99 V
Brady Statistic AP VP Percent: 67.21 %
Brady Statistic AP VS Percent: 0.02 %
Brady Statistic AS VP Percent: 32.76 %
Brady Statistic AS VS Percent: 0.01 %
Brady Statistic RA Percent Paced: 67.17 %
Brady Statistic RV Percent Paced: 99.96 %
Date Time Interrogation Session: 20231024214811
Implantable Lead Connection Status: 753985
Implantable Lead Connection Status: 753985
Implantable Lead Implant Date: 20150527
Implantable Lead Implant Date: 20211221
Implantable Lead Location: 753858
Implantable Lead Location: 753860
Implantable Lead Model: 3830
Implantable Lead Model: 5076
Implantable Pulse Generator Implant Date: 20211221
Lead Channel Impedance Value: 304 Ohm
Lead Channel Impedance Value: 304 Ohm
Lead Channel Impedance Value: 304 Ohm
Lead Channel Impedance Value: 342 Ohm
Lead Channel Impedance Value: 399 Ohm
Lead Channel Impedance Value: 399 Ohm
Lead Channel Impedance Value: 418 Ohm
Lead Channel Impedance Value: 475 Ohm
Lead Channel Impedance Value: 494 Ohm
Lead Channel Pacing Threshold Amplitude: 0.75 V
Lead Channel Pacing Threshold Amplitude: 0.75 V
Lead Channel Pacing Threshold Amplitude: 1 V
Lead Channel Pacing Threshold Pulse Width: 0.4 ms
Lead Channel Pacing Threshold Pulse Width: 0.4 ms
Lead Channel Pacing Threshold Pulse Width: 0.4 ms
Lead Channel Sensing Intrinsic Amplitude: 0.875 mV
Lead Channel Sensing Intrinsic Amplitude: 0.875 mV
Lead Channel Sensing Intrinsic Amplitude: 15.25 mV
Lead Channel Sensing Intrinsic Amplitude: 15.25 mV
Lead Channel Setting Pacing Amplitude: 1.25 V
Lead Channel Setting Pacing Amplitude: 1.5 V
Lead Channel Setting Pacing Amplitude: 2 V
Lead Channel Setting Pacing Pulse Width: 0.4 ms
Lead Channel Setting Pacing Pulse Width: 0.4 ms
Lead Channel Setting Sensing Sensitivity: 2 mV
Zone Setting Status: 755011

## 2022-04-02 NOTE — Telephone Encounter (Signed)
Pt would like f/u call regarding Dx, please call

## 2022-04-03 ENCOUNTER — Other Ambulatory Visit: Payer: Self-pay | Admitting: Acute Care

## 2022-04-03 ENCOUNTER — Telehealth: Payer: Self-pay | Admitting: Pulmonary Disease

## 2022-04-03 DIAGNOSIS — J069 Acute upper respiratory infection, unspecified: Secondary | ICD-10-CM

## 2022-04-03 MED ORDER — AMOXICILLIN-POT CLAVULANATE 875-125 MG PO TABS: 1.0000 | ORAL_TABLET | Freq: Two times a day (BID) | ORAL | 0 refills | Status: DC

## 2022-04-03 NOTE — Telephone Encounter (Signed)
I was able to get her scheduled with MW on Monday at 1130am.

## 2022-04-03 NOTE — Telephone Encounter (Signed)
Called and spoke with patient. She stated that she was in the hospital for a few days starting 10/10 due to a fever and bad reaction to a flu vaccine she received as well as PNA. She was discharged on 10/15 and felt much better. On Monday (10/23) her home health nurse came for a visit and listened to her lungs. Per the nurse, she could hear some rattling in both lungs. At this time she did not feel bad and didn't think anything of it. Yesterday, her fever returned and she developed a cough. She has not been able to produce any phlegm but she can feel the congestion in her chest now.   She is not currently on any antibiotics and has not tried anything over the counter yet. Confirmed that she is still using 4L of O2. Denies any increased SOB. She is fearful that she will develop PNA again.   She wants to know if she can have something sent in for her.   Pharmacy is [facility] on [address] and [address].   Judson Roch, can you please advise since RA is not available today? Thanks!

## 2022-04-03 NOTE — Telephone Encounter (Signed)
Spoke with pt

## 2022-04-03 NOTE — Telephone Encounter (Signed)
Called and spoke with patient. She verbalized understanding. I was able to get her scheduled with MW on 10/30 at 1130am.   Nothing further needed at time of call.

## 2022-04-05 NOTE — Progress Notes (Unsigned)
Kathleen [name], female    DOB: Sep 01, 1945   MRN: [id number]   Brief patient profile:  76  yowf quit smoking 1989  but lots of environmental smoke exposure 2014 with bad cough > PTX at [facility] 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  In ED, febrile to 103.  WBC 9.8.  CXR with bilateral lower lobe opacity but mostly chronic.  CT head without acute finding.  COVID-19 and influenza PCR nonreactive.  Cultures drawn.  She was started on broad-spectrum antibiotics, and admitted.   Patient's troponin trended from 03-2499 overnight.  She had no chest pain or acute ischemic finding on EKG.  Cardiology consulted. TTE with LVEF of 35 to 40%, RWMA, G1-DD.  Patient was started on IV heparin.  Underwent R/LHC on 10/13 that showed mild nonobstructive CAD and mild PHTN.  Cardiology signed off.   Antibiotic de-escalated to CTX and doxycycline for possible pneumonia.  Started on IV Solu-Medrol with scheduled and as needed nebulizers for COPD exacerbation.   Eventually, patient's symptoms improved.  She is cleared for discharge for outpatient follow-up by cardiology.  She completed antibiotic course for possible pneumonia.  She is discharged on p.o. prednisone, home Advair and Yupelri.  Changed albuterol nebulizer to DuoNeb.  Recommend outpatient follow-up with pulmonology.   Home health PT/OT ordered as recommended by therapy.   See individual problem list below for more.    Problems addressed during this hospitalization Active Problems:   HTN (hypertension)   Atrial  fibrillation (HCC)   Sleep apnea   Type 2 diabetes mellitus with diabetic neuropathy, without long-term current use of insulin (HCC)   Stage 3a chronic kidney disease (HCC)   Chronic anticoagulation   Post concussive syndrome   COPD with acute exacerbation (HCC)   Severe sepsis (HCC)   Non-STEMI (non-ST elevated myocardial infarction) (HCC)   Hypotension   Morbid obesity (HCC)   Acute on chronic combined systolic and diastolic CHF (congestive heart failure) (HCC)   COPD with mild acute exacerbation/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  Improved. -Discharged on p.o. prednisone for 2 more days. -Completed antibiotic course in-house. -Continue home Advair and Yupelri -Changing as needed albuterol to DuoNeb -[employer] -Encouraged to use minimum oxygen to keep a saturation above 90% -Encouraged to follow-up with her pulmonologist   Severe sepsis likely due to pneumonia: POA.  Febrile to 103 with tachypnea.  She has dry hacking cough.  CXR raises concern for by basilar opacities but CTA chest negative for PE or pneumonia.  COVID-19, influenza PCR full RVP panel negative.  Blood cultures NGTD.  MRSA PCR negative.  No lactic acidosis.  Markedly elevated Pro-Cal but improving.  Completed CAP coverage in house.   Non-STEMI: Troponin 10>>> 2438>> 1900. TTE with LVEF of 35 to 40%, RWMA and G1 DD.  Remains chest pain-free but dry cough, SOB and DOE.  R/LHC with mild CAD and mild PHTN. -Cardiology signed off. -Continue home losartan, Aldactone and torsemide -Cardiology to arrange outpatient follow-up in 6  weeks for repeat echo   Acute combined CHF/mild PHTN: BNP elevated.  TTE and R/LHC as above.  Concern about a stress-induced cardiomyopathy in the setting of acute illness.  Appears euvolemic on exam.  She is on torsemide at home. -Cardiology recommends repeat TTE in 6 weeks -Continue home losartan, Aldactone and torsemide -Cardiology to  arrange outpatient follow-up.   Hypotension: Resolved. -Continue home meds   Flulike illness-due to flu vaccine?  Had weakness, confusion, fever, myalgia after flu vaccine.  COVID-19, influenza and full RVP negative.  Symptoms seems to have resolved.   Paroxysmal A-fib on Eliquis: In sinus rhythm. -Continue home Tikosyn and Xarelto   Complete heart block s/p PPM   Hypokalemia/hypophosphatemia: Resolved.   Episode of BPPV: Resolved.   Generalized weakness -HH PT/OT ordered.   History of Present Illness  04/06/2022  Pulmonary/ Acute office eval/Alana [facility]  main on advair 230 2 puffs each am and yupelri each pm  No chief complaint on file. Dyspnea:  rollator up and down hallways to dining room on 4lpm but not checking sats  Cough: mucus is dark yellow on augmentin x 3 Sleep: flat bed 3 pillows  02 :   4lpm hs,  3lpm sitting  and 4lpm walking  SABA use: not using   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HCC)    CHF (congestive heart failure) (HCC)    Chronic renal insufficiency    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    Concussion 10/04/2021   COPD (chronic obstructive pulmonary disease) (HCC)    Gout    Hypertension    Longstanding persistent atrial fibrillation (HCC)    Nonischemic cardiomyopathy (HCC)    Obesity    Pacemaker    Spontaneous pneumothorax 2013    Outpatient Medications Prior to Visit  Medication Sig Dispense Refill   acetaminophen (TYLENOL) 650 MG CR tablet Take 1,300 mg by mouth as needed for pain.     amoxicillin-clavulanate (AUGMENTIN) 875-125 MG tablet Take 1 tablet by mouth 2 (two) times daily. 14 tablet 0   azelastine (ASTELIN) 0.1 % nasal spray Place 2 sprays into both nostrils 2 (two) times daily. 30 mL 5   Bempedoic Acid (NEXLETOL) 180 MG TABS Take 1 tablet by mouth daily. 90 tablet 0   Cholecalciferol (VITAMIN D3) 50 MCG (2000 UT)  TABS Take 2,000 Units by mouth at bedtime.     cyanocobalamin (,VITAMIN B-12,) 1000 MCG/ML injection Inject 1 mL (1,000 mcg total) into the muscle every 30 (thirty) days. 10 mL 0   docusate sodium (COLACE) 100 MG capsule Take 100 mg by mouth 2 (two) times daily.     dofetilide (TIKOSYN) 250 MCG capsule Take 1 capsule (250 mcg total) by mouth in the morning and at bedtime. 60 capsule 4   febuxostat (ULORIC) 40 MG tablet TAKE 1 TABLET(40 MG) BY MOUTH EVERY NIGHT (Patient taking differently: Take 40 mg by mouth at bedtime.) 90 tablet 0   fluticasone-salmeterol (ADVAIR HFA) 230-21 MCG/ACT inhaler Inhale 2 puffs into the lungs daily.     ipratropium-albuterol (DUONEB) 0.5-2.5 (3) MG/3ML SOLN Take 3 mLs by nebulization every 6 (six) hours as needed (Shortness of breath, cough and/or wheeze). 360 mL 2   linaclotide (LINZESS) 145 MCG CAPS capsule Take 145 mcg by mouth daily as needed (constipation).     losartan (COZAAR) 25 MG tablet TAKE 1 TABLET(25 MG) BY MOUTH  AT BEDTIME (Patient taking differently: Take 25 mg by mouth at bedtime.) 90 tablet 3   Magnesium 500 MG TABS Take 500 mg by mouth at bedtime.     mupirocin ointment (BACTROBAN) 2 % Apply 1 application. topically 2 (two) times daily. (Patient taking differently: Apply 1 application  topically 2 (two) times daily as needed (wound care).) 30 g 2   nystatin cream (MYCOSTATIN) Apply to affected area 2 times daily (Patient taking differently: Apply 1 Application topically 2 (two) times daily as needed for dry skin.) 90 g 1   OXYGEN Inhale 4.5 L into the lungs continuous. Use with resmed ventilator     rivaroxaban (XARELTO) 20 MG TABS tablet Take 1 tablet (20 mg total) by mouth daily with supper. 30 tablet 5   spironolactone (ALDACTONE) 25 MG tablet TAKE 1 TABLET(25 MG) BY MOUTH EVERY MORNING (Patient taking differently: Take 25 mg by mouth daily.) 90 tablet 3   torsemide (DEMADEX) 20 MG tablet Take 20mg  (1 Tab) Monday and Friday (Patient taking  differently: Take 20 mg by mouth 2 (two) times a week. Take 20mg  (1 Tab) Monday and Friday) 60 tablet 3   traMADol (ULTRAM) 50 MG tablet Take 50 mg by mouth every 6 (six) hours as needed for pain.     YUPELRI 175 MCG/3ML nebulizer solution USE 1 VIAL IN NEBULIZER DAILY - DO NOT MIX WITH OTHER NEB MEDS,USE BEFORE OR AFTER (Patient taking differently: Take 175 mcg by nebulization daily.) 30 mL 11   amitriptyline (ELAVIL) 25 MG tablet Take 1 tablet (25 mg total) by mouth at bedtime. (Patient not taking: Reported on 03/25/2022) 30 tablet 3   benzonatate (TESSALON) 100 MG capsule Take 1 capsule (100 mg total) by mouth 3 (three) times daily. (Patient not taking: Reported on 03/25/2022) 20 capsule 0   No facility-administered medications prior to visit.     Objective:     BP 114/62 (BP Location: Left Arm, Patient Position: Sitting, Cuff Size: Normal)   Pulse 87   Temp 97.8 F (36.6 C) (Oral)   Ht 5\' 2"  (1.575 m)   Wt 192 lb 12.8 oz (87.5 kg)   SpO2 95% Comment: On O2  BMI 35.26 kg/m   SpO2: 95 % (On O2) O2 Type: Pulse O2 O2 Flow Rate (L/min): 4 L/min      Assessment   No problem-specific Assessment & Plan notes found for this encounter.     Christinia Gully, MD 04/06/2022

## 2022-04-06 ENCOUNTER — Encounter: Payer: Self-pay | Admitting: Internal Medicine

## 2022-04-06 ENCOUNTER — Ambulatory Visit (INDEPENDENT_AMBULATORY_CARE_PROVIDER_SITE_OTHER): Payer: Medicare Other | Admitting: Internal Medicine

## 2022-04-06 VITALS — BP 114/62 | HR 87 | Temp 97.8°F | Ht 62.0 in | Wt 192.8 lb

## 2022-04-06 DIAGNOSIS — G319 Degenerative disease of nervous system, unspecified: Secondary | ICD-10-CM

## 2022-04-06 DIAGNOSIS — Z48812 Encounter for surgical aftercare following surgery on the circulatory system: Secondary | ICD-10-CM | POA: Diagnosis not present

## 2022-04-06 DIAGNOSIS — I480 Paroxysmal atrial fibrillation: Secondary | ICD-10-CM

## 2022-04-06 DIAGNOSIS — Z950 Presence of cardiac pacemaker: Secondary | ICD-10-CM

## 2022-04-06 DIAGNOSIS — Z9181 History of falling: Secondary | ICD-10-CM

## 2022-04-06 DIAGNOSIS — I442 Atrioventricular block, complete: Secondary | ICD-10-CM

## 2022-04-06 DIAGNOSIS — G4730 Sleep apnea, unspecified: Secondary | ICD-10-CM

## 2022-04-06 DIAGNOSIS — J441 Chronic obstructive pulmonary disease with (acute) exacerbation: Secondary | ICD-10-CM

## 2022-04-06 DIAGNOSIS — N1831 Chronic kidney disease, stage 3a: Secondary | ICD-10-CM

## 2022-04-06 DIAGNOSIS — I272 Pulmonary hypertension, unspecified: Secondary | ICD-10-CM

## 2022-04-06 DIAGNOSIS — J432 Centrilobular emphysema: Secondary | ICD-10-CM | POA: Diagnosis not present

## 2022-04-06 DIAGNOSIS — R040 Epistaxis: Secondary | ICD-10-CM

## 2022-04-06 DIAGNOSIS — I088 Other rheumatic multiple valve diseases: Secondary | ICD-10-CM

## 2022-04-06 DIAGNOSIS — I5043 Acute on chronic combined systolic (congestive) and diastolic (congestive) heart failure: Secondary | ICD-10-CM

## 2022-04-06 DIAGNOSIS — I214 Non-ST elevation (NSTEMI) myocardial infarction: Secondary | ICD-10-CM | POA: Diagnosis not present

## 2022-04-06 DIAGNOSIS — J9611 Chronic respiratory failure with hypoxia: Secondary | ICD-10-CM

## 2022-04-06 DIAGNOSIS — I130 Hypertensive heart and chronic kidney disease with heart failure and stage 1 through stage 4 chronic kidney disease, or unspecified chronic kidney disease: Secondary | ICD-10-CM

## 2022-04-06 DIAGNOSIS — I2510 Atherosclerotic heart disease of native coronary artery without angina pectoris: Secondary | ICD-10-CM

## 2022-04-06 DIAGNOSIS — I428 Other cardiomyopathies: Secondary | ICD-10-CM

## 2022-04-06 DIAGNOSIS — Z7951 Long term (current) use of inhaled steroids: Secondary | ICD-10-CM

## 2022-04-06 DIAGNOSIS — Z6835 Body mass index (BMI) 35.0-35.9, adult: Secondary | ICD-10-CM

## 2022-04-06 DIAGNOSIS — E1140 Type 2 diabetes mellitus with diabetic neuropathy, unspecified: Secondary | ICD-10-CM

## 2022-04-06 DIAGNOSIS — E1122 Type 2 diabetes mellitus with diabetic chronic kidney disease: Secondary | ICD-10-CM

## 2022-04-06 DIAGNOSIS — I4892 Unspecified atrial flutter: Secondary | ICD-10-CM

## 2022-04-06 MED ORDER — FLUTICASONE FUROATE-VILANTEROL 100-25 MCG/ACT IN AEPB: 1.0000 | INHALATION_SPRAY | Freq: Every day | RESPIRATORY_TRACT | 4 refills | Status: DC

## 2022-04-06 MED ORDER — ALBUTEROL SULFATE HFA 108 (90 BASE) MCG/ACT IN AERS: 2.0000 | INHALATION_SPRAY | RESPIRATORY_TRACT | 2 refills | Status: DC | PRN

## 2022-04-06 NOTE — Patient Instructions (Addendum)
Plan A = Automatic = Always=    BREO one click each am along with Yupelri and stop the advair  Plan B = Backup (to supplement plan A, not to replace it) Only use your albuterol inhaler(puffer)  as a rescue medication to be used if you can't catch your breath by resting or doing a relaxed purse lip breathing pattern.  - The less you use it, the better it will work when you need it. - Ok to use the inhaler up to 2 puffs  every 4 hours if you must but call for appointment if use goes up over your usual need - Don't leave home without it !!  (think of it like the spare tire for your car)   Plan C = Crisis (instead of Plan B but only if Plan B stops working) - only use your albuterol nebulizer if you first try Plan B and it fails to help > ok to use the nebulizer up to every 4 hours but if start needing it regularly call for immediate appointment  Make sure you check your oxygen saturation  AT  your highest level of activity (not after you stop)   to be sure it stays over 90% and adjust  02 flow upward to maintain this level if needed but remember to turn it back to previous settings when you stop (to conserve your supply).   My office will be contacting you by phone for referral to ENT   - if you don't hear back from my office within one week please call us back or notify us thru MyChart and we'll address it right away.   Please schedule a follow up office visit in 2  weeks, sooner if needed

## 2022-04-08 ENCOUNTER — Encounter: Payer: Self-pay | Admitting: Internal Medicine

## 2022-04-08 NOTE — Assessment & Plan Note (Signed)
Advised:  Make sure you check your oxygen saturation  AT  your highest level of activity (not after you stop)   to be sure it stays over 90% and adjust  02 flow upward to maintain this level if needed but remember to turn it back to previous settings when you stop (to conserve your supply).          Each maintenance medication was reviewed in detail including emphasizing most importantly the difference between maintenance and prns and under what circumstances the prns are to be triggered using an action plan format where appropriate.  Total time for H and P, chart review, counseling, reviewing hfa/neb/02  device(s) and generating customized AVS unique to this ACUTE  office visit / same day charting = 30 min with pt new to me

## 2022-04-08 NOTE — Assessment & Plan Note (Signed)
Quit smoking 1989 - Spirometry  02/03/21  FEV1 0.95 (48%)  Ratio 0.59   With classic concave curvature to f/v  Acute flare in settin of rhinitis/? Sinusitis > already on augmentin but confused about how/ when to take maint vs prns so rec  Complete the augmetin and f/u ent  Breo 100 / yupelri qam and prn saba: Re SABA :  I spent extra time with pt today reviewing appropriate use of albuterol for prn use on exertion with the following points: 1) saba is for relief of sob that does not improve by walking a slower pace or resting but rather if the pt does not improve after trying this first. 2) If the pt is convinced, as many are, that saba helps recover from activity faster then it's easy to tell if this is the case by re-challenging : ie stop, take the inhaler, then p 5 minutes try the exact same activity (intensity of workload) that just caused the symptoms and see if they are substantially diminished or not after saba 3) if there is an activity that reproducibly causes the symptoms, try the saba 15 min before the activity on alternate days   If in fact the saba really does help, then fine to continue to use it prn but advised may need to look closer at the maintenance regimen being used to achieve better control of airways disease with exertion.    F/u with Elsworth Soho as planned, call sooner if needed

## 2022-04-14 NOTE — Progress Notes (Signed)
Remote pacemaker transmission.   

## 2022-04-15 ENCOUNTER — Other Ambulatory Visit: Payer: Medicare Other | Admitting: Family Medicine

## 2022-04-15 ENCOUNTER — Encounter: Payer: Self-pay | Admitting: Family Medicine

## 2022-04-15 VITALS — HR 77 | Resp 18

## 2022-04-15 DIAGNOSIS — I5022 Chronic systolic (congestive) heart failure: Secondary | ICD-10-CM

## 2022-04-15 DIAGNOSIS — J441 Chronic obstructive pulmonary disease with (acute) exacerbation: Secondary | ICD-10-CM

## 2022-04-15 DIAGNOSIS — J9611 Chronic respiratory failure with hypoxia: Secondary | ICD-10-CM

## 2022-04-15 DIAGNOSIS — F321 Major depressive disorder, single episode, moderate: Secondary | ICD-10-CM

## 2022-04-15 NOTE — Progress Notes (Signed)
[facility] Consult Note Telephone: [phone]  Fax: [phone]    Date of encounter: 04/15/22 11:10 AM PATIENT NAME: Kathleen [name] Kathleen [name] [address] [place] Kathleen [name] [id number]   [id number] (home)  DOB: 12-19-1945 MRN: [id number] PRIMARY CARE PROVIDER:    [name], MD,  [place] [address] [phone]  REFERRING PROVIDER:   [name], MD [address] [place],  [place] [address] [phone]  RESPONSIBLE PARTY:    Contact Information     Name Relation Home Work Kathleen [name] Son [phone]  [phone]   Kathleen [name] [phone]  [phone]   Kathleen [name] Daughter   [phone]        I met face to face with patient in her [place] facility. Palliative Care was asked to follow this patient by consultation request of  [name], MD to address advance care planning and complex medical decision making. This is a follow up visit.  ASSESSMENT, SYMPTOM MANAGEMENT AND PLAN / RECOMMENDATIONS:   Single episode major depression, moderate Given recent chronic health challenges pt is struggling and may be exacerbated in part by hx of SDH last year. Recommend possible SSRI like Fluoxetine in low dose to see if improves outlook and functioning.   Chronic respiratory failure with hypoxia/acute   COPD exacerbation Exacerbation resolving, completing antibiotics, SOB at baseline less sputum production Continue recommendations from Pulmonology with Kathleen [name] and Kathleen [name] with Albuterol for rescue.    Chronic systolic heart failure Question possible early volume overload  Follow up repeat echo per Cardiology to evaluate EF and for wall motion abnormality or improvement. Continue daily weights, Aldactone, torsemide.      Advance Care Planning/Goals of Care: Goals include to maximize quality of life and symptom management.  CODE STATUS: Full Code at  present   This visit was coded based on medical decision making (MDM).  PPS: 50%  HOSPICE ELIGIBILITY/DIAGNOSIS: TBD  Chief Complaint:  Palliative Care is following for chronic medical management of pt with chronic respiratory failure with COPD and heart failure.  HISTORY OF PRESENT ILLNESS:  Kathleen [name] is a 76 y.o. 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  She is currently on O2 at 4 L/min. She is having a repeat echo 05/04/22.  She says she will see the NP at around 2 pm to go over her echo.  The medicine her Neurologist prescribed she had to stop due to worsening fatigue and she couldn't halve that small a pill (amitriptylline). She was sleeping half the day on the medicine.   She has occasional cough.  She continues to have PT. She states she saw Dr Melvyn Novas who changed her meds to [facility].  He wants her on Breo Ellipta.  Pt states she is frustrated with not being able to schedule her Cardiologist appt for January especially given her recent significant cardiac enzyme elevation.  She becomes tearful when discussing. No PND but has 3 pillow orthopnea that is unchanged.  No falls, but has not had headache. Doesn't eat much because doesn't like the quality of food.  Reports increased fatigue  and lack of energy.  Advises some intermittent chest pain with activity. Had recent COPD exacerbation and has completed antibiotic therapy with improvement in cough, decrease in sputum production and normal in color instead of yellow.  Has a craft show she is trying to sew some things for but has trouble deriving motivation to  work on them, has trouble with focus, is emotionally labile, sleeping more.     History obtained from review of EMR, discussion with Kathleen [name].    I reviewed EMR and available labs, imaging, studies or related documents are summarized above.   ROS General: NAD EYES: denies vision changes Cardiovascular: denies chest pain, endorses DOE Pulmonary: Decreased cough, occasionally productive  Abdomen: endorses poor appetite, endorses continence of bowel MSK:   no falls reported Neurological: endorses chronic pain in low back, Reports 1 headache recently with intermittent dizziness, denies insomnia Psych: Endorses feeling down/depressed   Physical Exam: Current and past weights: 191.8 lbs on 04/15/22, weight 197 lbs 3.2 oz on 01/19/22 (weight fluctuates with heart failure) Constitutional: NAD General: WD, obese  CV: S1S2, RRR, rate controlled today, trace pedal edema. Pulmonary: CTAB except LLL fine crackles, on 4 L O2 per [place] Abdomen: normo-active BS + 4 quadrants, soft and non tender MSK: ambulatory with rollator Neuro:  A and O x 3 Psych: tearful, depressed affect.     Thank you for the opportunity to participate in the care of [name].  The palliative care team will continue to follow. Please call our office at [phone] if we can be of additional assistance.   Kathleen Conception, FNP-C  COVID-19 PATIENT SCREENING TOOL Asked and negative response unless otherwise noted:   Have you had symptoms of covid, tested positive or been in contact with someone with symptoms/positive test in the past 5-10 days?  No

## 2022-04-20 ENCOUNTER — Other Ambulatory Visit (HOSPITAL_COMMUNITY): Payer: Self-pay | Admitting: *Deleted

## 2022-04-20 MED ORDER — DOFETILIDE 250 MCG PO CAPS: 250.0000 ug | ORAL_CAPSULE | Freq: Two times a day (BID) | ORAL | 6 refills | Status: DC

## 2022-04-21 ENCOUNTER — Ambulatory Visit (INDEPENDENT_AMBULATORY_CARE_PROVIDER_SITE_OTHER): Payer: Medicare Other

## 2022-04-21 ENCOUNTER — Telehealth: Payer: Self-pay | Admitting: Nurse Practitioner

## 2022-04-21 ENCOUNTER — Ambulatory Visit (INDEPENDENT_AMBULATORY_CARE_PROVIDER_SITE_OTHER): Payer: Medicare Other | Admitting: Nurse Practitioner

## 2022-04-21 ENCOUNTER — Encounter: Payer: Self-pay | Admitting: Nurse Practitioner

## 2022-04-21 VITALS — BP 110/62 | HR 48 | Temp 98.7°F | Ht 61.0 in | Wt 188.0 lb

## 2022-04-21 DIAGNOSIS — J189 Pneumonia, unspecified organism: Secondary | ICD-10-CM

## 2022-04-21 DIAGNOSIS — I480 Paroxysmal atrial fibrillation: Secondary | ICD-10-CM

## 2022-04-21 DIAGNOSIS — J9611 Chronic respiratory failure with hypoxia: Secondary | ICD-10-CM | POA: Diagnosis not present

## 2022-04-21 DIAGNOSIS — J441 Chronic obstructive pulmonary disease with (acute) exacerbation: Secondary | ICD-10-CM

## 2022-04-21 LAB — BASIC METABOLIC PANEL
BUN: 15 mg/dL (ref 6–23)
CO2: 28 mEq/L (ref 19–32)
Calcium: 9.6 mg/dL (ref 8.4–10.5)
Chloride: 98 mEq/L (ref 96–112)
Creatinine, Ser: 0.86 mg/dL (ref 0.40–1.20)
GFR: 66.13 mL/min (ref >60.00)
Glucose, Bld: 116 mg/dL — ABNORMAL HIGH (ref 70–99)
Potassium: 4.1 mEq/L (ref 3.5–5.1)
Sodium: 135 mEq/L (ref 135–145)

## 2022-04-21 LAB — CBC WITH DIFFERENTIAL/PLATELET
Basophils Absolute: 0 10*3/uL (ref 0.0–0.1)
Basophils Relative: 0.7 % (ref 0.0–3.0)
Eosinophils Absolute: 0 10*3/uL (ref 0.0–0.7)
Eosinophils Relative: 0.1 % (ref 0.0–5.0)
HCT: 39.6 % (ref 36.0–46.0)
Hemoglobin: 13.1 g/dL (ref 12.0–15.0)
Lymphocytes Relative: 9.3 % — ABNORMAL LOW (ref 12.0–46.0)
Lymphs Abs: 0.6 10*3/uL — ABNORMAL LOW (ref 0.7–4.0)
MCHC: 33.1 g/dL (ref 30.0–36.0)
MCV: 82.4 fl (ref 78.0–100.0)
Monocytes Absolute: 0.6 10*3/uL (ref 0.1–1.0)
Monocytes Relative: 8.6 % (ref 3.0–12.0)
Neutro Abs: 5.7 10*3/uL (ref 1.4–7.7)
Neutrophils Relative %: 81.3 % — ABNORMAL HIGH (ref 43.0–77.0)
Platelets: 201 10*3/uL (ref 150.0–400.0)
RBC: 4.8 Mil/uL (ref 3.87–5.11)
RDW: 15.7 % — ABNORMAL HIGH (ref 11.5–15.5)
WBC: 7 10*3/uL (ref 4.0–10.5)

## 2022-04-21 MED ORDER — ALBUTEROL SULFATE (2.5 MG/3ML) 0.083% IN NEBU: 2.5000 mg | INHALATION_SOLUTION | Freq: Four times a day (QID) | RESPIRATORY_TRACT | 5 refills | Status: DC | PRN

## 2022-04-21 MED ORDER — PREDNISONE 10 MG PO TABS: ORAL_TABLET | ORAL | 0 refills | Status: DC

## 2022-04-21 MED ORDER — CEFPODOXIME PROXETIL 200 MG PO TABS: 200.0000 mg | ORAL_TABLET | Freq: Two times a day (BID) | ORAL | 0 refills | Status: DC

## 2022-04-21 MED ORDER — METHYLPREDNISOLONE ACETATE 80 MG/ML IJ SUSP
80.0000 mg | Freq: Once | INTRAMUSCULAR | Status: AC
  Administered 2022-04-21: 80 mg via INTRAMUSCULAR

## 2022-04-21 NOTE — Progress Notes (Signed)
@Patient  ID: , female    DOB: November 23, 1945, 76 y.o.   MRN: [id number]  Chief Complaint  Patient presents with   Follow-up    C/o increased sob x 2-3 days, cough-dk. Yellow thick, ?fever, wheezing    Referring provider: [id number], MD  HPI: 76 year old female, former smoker followed for asthma/COPD, tracheobronchomalacia, chronic respiratory failure on supplemental O2, OSA on BiPAP. She is a patient of Dr. 61 and last seen in office on 04/06/2022 by Dr. 04/08/2022. Past medical history significant for history of NSTEMI, hypertension, PAF on Xarelto, CHF, type 2 diabetes, HLD, obesity.  She was recently hospitalized from 03/17/2022 to 03/22/2022 for sepsis felt initially thought to be related to pneumonia.  She had a fevers up to 103 and a dry hacking cough.  CTA was negative for PE or acute airspace disease.  She was treated for AECOPD with steroids and IV antibiotics.  She was weaned to her baseline supplemental O2 and discharged on 03/24/2022.  She then contacted the office on 10/27 with recurrent fevers and increased cough.  She was advised to come in for an office visit.  She was also sent in Augmentin for 7 days.  She was seen 04/06/2022 by Dr. 04/08/2022 for acute visit.  She was advised to finish antibiotics and continue triple therapy regimen.  TEST/EVENTS:  03/18/2022 CTA chest: No evidence of PE.  Ectasia of main pulmonary artery, suggesting PAH.  Centrilobular emphysema.  Small linear densities in the lower lung fields, suggest scarring or atelectasis.  No focal pulmonary consolidation.  04/21/2022: Today-follow-up Patient presents today for intended follow-up with her son.  Unfortunately, over the last 2 to 3 days she had recurrent fevers and has developed an increased productive cough with yellow sputum.  She feels very fatigued and rundown.  Breathing is more short than it normally is.  She also has chest congestion and wheezing.  She did have an episode of  hemoptysis yesterday.  No further occurrences since.  She denies any calf pain or swelling.  She is compliant with her Xarelto.  Has not missed any doses.  She is eating and drinking well.  She is using Breo once a day and Yupelri once a day.  Uses her albuterol rescue inhaler twice daily.  Does not have any rescue medicine for nebulizer machine.  She completed previously prescribed Augmentin around 11/3.  She increased her supplemental oxygen at home to 5 L.  Previously on 4.  Not routinely checking her oxygen levels at home.  Allergies  Allergen Reactions   Allopurinol Other (See Comments)    Reaction:  Dizziness    Clindamycin Anaphylaxis and Hives   Flublok [Influenza Vaccine Recombinant] Other (See Comments)    Fever 103 with no alternative explanation day after vaccine.  13/3 [place] FNP-C   Pneumococcal 13-Val Conj Vacc Itching, Swelling and Rash   Dronedarone Rash   Brovana [Arformoterol]     Caused muscle pain   Budesonide     Caused extreme joint pain   Entresto [Sacubitril-Valsartan] Other (See Comments)    hypotension   Fosamax [Alendronate Sodium] Nausea Only   Jardiance [Empagliflozin]     Caused a vaginal infection   Meperidine Nausea And Vomiting   Microplegia Msa-Msg [Plegisol]     Loopy,diarrhea   Rosuvastatin Other (See Comments)    Reaction:  Muscle spasms    Tetracycline Hives   Adhesive [Tape] Rash   Lovastatin Rash    Other reaction(s):  Muscle Pain    Immunization History  Administered Date(s) Administered   Fluad Quad(high Dose 65+) 04/22/2021, 03/16/2022   Hepatitis A, Adult 04/12/2001   Influenza, High Dose Seasonal PF 03/16/2019   PFIZER Comirnaty(Gray Top)Covid-19 Tri-Sucrose Vaccine 03/29/2020, 10/09/2020   PFIZER(Purple Top)SARS-COV-2 Vaccination 07/13/2019, 08/08/2019   Pneumococcal Conjugate-13 08/30/2014   Pneumococcal Polysaccharide-23 03/31/2019   Tdap 10/02/2013   Yellow Fever 04/12/2001    Past Medical History:  Diagnosis Date    Asthma    Atypical atrial flutter (HCC)    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HCC)    CHF (congestive heart failure) (HCC)    Chronic renal insufficiency    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    Concussion 10/04/2021   COPD (chronic obstructive pulmonary disease) (HCC)    Gout    Hypertension    Longstanding persistent atrial fibrillation (HCC)    Nonischemic cardiomyopathy (HCC)    Obesity    Pacemaker    Spontaneous pneumothorax 2013    Tobacco History: Social History   Tobacco Use  Smoking Status Former   Packs/day: 1.00   Years: 20.00   Total pack years: 20.00   Types: Cigarettes   Quit date: 06/09/1987   Years since quitting: 34.8   Passive exposure: Past  Smokeless Tobacco Never  Tobacco Comments   Former smoker 12/25/21   Counseling given: Not Answered Tobacco comments: Former smoker 12/25/21   Outpatient Medications Prior to Visit  Medication Sig Dispense Refill   acetaminophen (TYLENOL) 650 MG CR tablet Take 1,300 mg by mouth as needed for pain.     albuterol (VENTOLIN HFA) 108 (90 Base) MCG/ACT inhaler Inhale 2 puffs into the lungs every 4 (four) hours as needed for wheezing or shortness of breath. 8 g 2   azelastine (ASTELIN) 0.1 % nasal spray Place 2 sprays into both nostrils 2 (two) times daily. 30 mL 5   Bempedoic Acid (NEXLETOL) 180 MG TABS Take 1 tablet by mouth daily. 90 tablet 0   Cholecalciferol (VITAMIN D3) 50 MCG (2000 UT) TABS Take 2,000 Units by mouth at bedtime.     cyanocobalamin (,VITAMIN B-12,) 1000 MCG/ML injection Inject 1 mL (1,000 mcg total) into the muscle every 30 (thirty) days. 10 mL 0   docusate sodium (COLACE) 100 MG capsule Take 100 mg by mouth 2 (two) times daily.     dofetilide (TIKOSYN) 250 MCG capsule Take 1 capsule (250 mcg total) by mouth in the morning and at bedtime. 60 capsule 6   febuxostat (ULORIC) 40 MG tablet TAKE 1 TABLET(40 MG) BY MOUTH EVERY NIGHT (Patient taking differently: Take  40 mg by mouth at bedtime.) 90 tablet 0   fluticasone furoate-vilanterol (BREO ELLIPTA) 100-25 MCG/ACT AEPB Inhale 1 puff into the lungs daily. 60 each 4   linaclotide (LINZESS) 145 MCG CAPS capsule Take 145 mcg by mouth daily as needed (constipation).     losartan (COZAAR) 25 MG tablet TAKE 1 TABLET(25 MG) BY MOUTH AT BEDTIME (Patient taking differently: Take 25 mg by mouth at bedtime.) 90 tablet 3   Magnesium 500 MG TABS Take 500 mg by mouth at bedtime.     mupirocin ointment (BACTROBAN) 2 % Apply 1 application. topically 2 (two) times daily. (Patient taking differently: Apply 1 application  topically 2 (two) times daily as needed (wound care).) 30 g 2   nystatin cream (MYCOSTATIN) Apply to affected area 2 times daily (Patient taking differently: Apply 1 Application topically 2 (two) times daily as needed for  dry skin.) 90 g 1   OXYGEN Inhale 4.5 L into the lungs continuous. Use with resmed ventilator     rivaroxaban (XARELTO) 20 MG TABS tablet Take 1 tablet (20 mg total) by mouth daily with supper. 30 tablet 5   spironolactone (ALDACTONE) 25 MG tablet TAKE 1 TABLET(25 MG) BY MOUTH EVERY MORNING (Patient taking differently: Take 25 mg by mouth daily.) 90 tablet 3   torsemide (DEMADEX) 20 MG tablet Take 20mg  (1 Tab) Monday and Friday (Patient taking differently: Take 20 mg by mouth 2 (two) times a week. Take 20mg  (1 Tab) Monday and Friday) 60 tablet 3   traMADol (ULTRAM) 50 MG tablet Take 50 mg by mouth every 6 (six) hours as needed for pain.     YUPELRI 175 MCG/3ML nebulizer solution USE 1 VIAL IN NEBULIZER DAILY - DO NOT MIX WITH OTHER NEB MEDS,USE BEFORE OR AFTER (Patient taking differently: Take 175 mcg by nebulization daily.) 30 mL 11   amoxicillin-clavulanate (AUGMENTIN) 875-125 MG tablet Take 1 tablet by mouth 2 (two) times daily. 14 tablet 0   ipratropium-albuterol (DUONEB) 0.5-2.5 (3) MG/3ML SOLN Take 3 mLs by nebulization every 6 (six) hours as needed (Shortness of breath, cough and/or  wheeze). 360 mL 2   No facility-administered medications prior to visit.     Review of Systems:   Constitutional: No weight loss or gain, night sweats, fevers, chills. +fatigue, lassitude. HEENT: No headaches, difficulty swallowing, tooth/dental problems, or sore throat. No sneezing, itching, ear ache. +occasional nasal congestion, post nasal drip CV:  No chest pain, orthopnea, PND, swelling in lower extremities, anasarca, dizziness, palpitations, syncope Resp: +shortness of breath with exertion; productive cough; wheezing; chest congestion; hemoptysis episode x 1. No chest wall deformity GI:  No heartburn, indigestion, abdominal pain, nausea, vomiting, diarrhea, change in bowel habits, loss of appetite, bloody stools.  GU: No dysuria, change in color of urine, urgency or frequency.  No flank pain, no hematuria  Skin: No rash, lesions, ulcerations MSK:  No joint pain or swelling.   Neuro: No dizziness or lightheadedness.  Psych: No depression or anxiety. Mood stable.     Physical Exam:  BP 110/62 (BP Location: Right Arm, Cuff Size: Large)   Pulse (!) 48   Temp 98.7 F (37.1 C) (Temporal)   Ht 5\' 1"  (1.549 m)   Wt 188 lb (85.3 kg)   SpO2 94%   BMI 35.52 kg/m   GEN: Pleasant, interactive, chronically-ill appearing; obese; in no acute distress HEENT:  Normocephalic and atraumatic. PERRLA. Sclera white. Nasal turbinates pink, moist and patent bilaterally. No rhinorrhea present. Oropharynx pink and moist, without exudate or edema. No lesions, ulcerations, or postnasal drip.  NECK:  Supple w/ fair ROM. No JVD present. Normal carotid impulses w/o bruits. Thyroid symmetrical with no goiter or nodules palpated. No lymphadenopathy.   CV: RRR, no m/r/g, no peripheral edema. Pulses intact, +2 bilaterally. No cyanosis, pallor or clubbing. PULMONARY:  Unlabored, regular breathing. Scattered rhonchi bilaterally A&P. Congested cough. No accessory muscle use.  GI: BS present and normoactive.  Soft, non-tender to palpation. No organomegaly or masses detected.  MSK: No erythema, warmth or tenderness. Cap refil <2 sec all extrem. No deformities or joint swelling noted.  Neuro: A/Ox3. No focal deficits noted.   Skin: Warm, no lesions or rashe Psych: Normal affect and behavior. Judgement and thought content appropriate.     Lab Results:  CBC    Component Value Date/Time   WBC 8.4 03/22/2022 0039   RBC 3.88  03/22/2022 0039   HGB 10.4 (L) 03/22/2022 0039   HGB 13.4 12/28/2012 0748   HCT 32.2 (L) 03/22/2022 0039   HCT 39.2 12/28/2012 0748   PLT 225 03/22/2022 0039   PLT 186 12/28/2012 0748   MCV 83.0 03/22/2022 0039   MCV 87 12/28/2012 0748   MCH 26.8 03/22/2022 0039   MCHC 32.3 03/22/2022 0039   RDW 14.1 03/22/2022 0039   RDW 15.0 (H) 12/28/2012 0748   LYMPHSABS 0.3 (L) 03/17/2022 1847   LYMPHSABS 0.4 (L) 12/28/2012 0748   MONOABS 0.4 03/17/2022 1847   MONOABS 0.5 12/28/2012 0748   EOSABS 0.0 03/17/2022 1847   EOSABS 0.0 12/28/2012 0748   BASOSABS 0.0 03/17/2022 1847   BASOSABS 0.0 12/28/2012 0748    BMET    Component Value Date/Time   NA 138 03/22/2022 0039   NA 130 (L) 12/28/2012 0748   K 4.2 03/22/2022 0039   K 4.6 12/28/2012 0748   CL 106 03/22/2022 0039   CL 95 (L) 12/28/2012 0748   CO2 23 03/22/2022 0039   CO2 23 12/28/2012 0748   GLUCOSE 145 (H) 03/22/2022 0039   GLUCOSE 391 (H) 12/28/2012 0748   BUN 13 03/22/2022 0039   BUN 32 (H) 12/28/2012 0748   CREATININE 0.68 03/22/2022 0039   CREATININE 1.29 12/28/2012 0748   CALCIUM 8.7 (L) 03/22/2022 0039   CALCIUM 14.3 (HH) 06/26/2019 1618   GFRNONAA >60 03/22/2022 0039   GFRNONAA 43 (L) 12/28/2012 0748   GFRAA 58 (L) 06/28/2019 0427   GFRAA 50 (L) 12/28/2012 0748    BNP    Component Value Date/Time   BNP 380.3 (H) 03/22/2022 0041     Imaging:  DG Chest 2 View  Result Date: 04/21/2022 CLINICAL DATA:  Productive cough EXAM: CHEST - 2 VIEW COMPARISON:  Chest radiograph 03/17/2022, CTA chest  03/18/2022 FINDINGS: The left chest wall cardiac device and associated leads are stable. The cardiomediastinal silhouette is stable with unchanged mild cardiomegaly. Lungs remain hyperinflated with flattening of the diaphragms consistent with underlying COPD. There are patchy opacities in the right lower lobe which are increased from the prior study. There is no other focal airspace disease. There is no pulmonary edema. There is no pleural effusion or pneumothorax There is no acute osseous abnormality. IMPRESSION: Patchy opacities in the right lower lobe suspicious for pneumonia superimposed on background COPD. Recommend follow-up radiographs in 3-4 weeks to ensure resolution. Electronically Signed   By: Lesia Hausen M.D.   On: 04/21/2022 12:20   CUP PACEART REMOTE DEVICE CHECK  Result Date: 04/02/2022 Scheduled remote reviewed. Normal device function.  Next remote 91 days. LA   methylPREDNISolone acetate (DEPO-MEDROL) injection 80 mg     Date Action Dose Route User   04/21/2022 1230 Given 80 mg Intramuscular (Left Upper Outer Quadrant) Charlott Holler, RN           No data to display          No results found for: "NITRICOXIDE"      Assessment & Plan:   CAP (community acquired pneumonia) Right lower lobe opacities consistent with pneumonia.  She was recently hospitalized for sepsis felt to be related to pneumonia.  Imaging at the time was without any acute process.  Treated with IV antibiotics.  She then had recurrence of fevers and increased cough and was treated 10/27 with 7-day course of Augmentin.  She felt some better but never back to her baseline.  Developed productive cough and new fevers 2  to 3 days ago.  Limited on antibiotic choices based on her allergies and current medications.  Unable to treat her with quinolones or macrolides due to her being on Tikosyn.  She is allergic to tetracyclines.  We will treat her with 14-day course of cefpodoxime.  Sputum culture ordered  today.  Instructed her on proper collection techniques.  She was nontoxic-appearing with stable heart rate and blood pressure.  We were able to maintain her oxygen on 6 L/min.  She was provided with very strict return/ED precautions.  Instructed on mucociliary clearance therapies.  Close follow-up.  Patient Instructions  Continue Breo 1 puff daily. Brush tongue and rinse mouth afterwards Continue Yupelri 1 vial once daily  Continue Albuterol inhaler 2 puffs or 3 mL neb every 6 hours as needed for shortness of breath or wheezing. Notify if symptoms persist despite rescue inhaler/neb use. Use neb twice daily  Continue astelin 2 sprays each nostril Twice daily  Continue supplemental oxygen 5-6 lpm to maintain saturations >88-90% Continue BiPAP nightly, minimum of 4-6 hours  Cefpodoxime 200 mg Twice daily for 14 days. Take with food  Prednisone taper. 4 tabs for 3 days, then 3 tabs for 3 days, 2 tabs for 3 days, then 1 tab for 3 days, then stop. Take in AM with food. Guaifenesin 600 mg Twice daily for chest congestion Flutter valve 2-3 times a day following breathing treatment  If you develop worsening symptoms or are unable to maintain oxygen levels >88-90%, you need to go to the hospital  Collect sputum culture and return  Labs today - CBC with diff and BMET   Follow up in 7 days with Dr. Vassie Loll or Philis Nettle. If symptoms do not improve or worsen, please contact office for sooner follow up or seek emergency care.    COPD with acute exacerbation (HCC) Unresolving AECOPD now with superimposed infection.  We will treat her with Depo injection and prednisone taper.  She will continue to use triple therapy with Virgel Bouquet and Mikael Spray.  Rx sent for albuterol nebs.  Instructed her to use these twice a day prior to inhaler therapy.  See above plan.  Chronic respiratory failure with hypoxia (HCC) Increased O2 requirement from 4 L/min to 6 L/min.  She was able to maintain greater than 90% on 6 L in the  office.  She will monitor at home for goal greater than 88 to 90%.  Understands that she needs to go to the hospital should she have further increased oxygen requirements.  Atrial fibrillation (HCC) Right controlled at visit.  She is on Xarelto which I suspect is related to the episode of hemoptysis that she had as well as airway irritation.  Advised her to go to the ED if mass hemoptysis this develops.   I spent 45 minutes of dedicated to the care of this patient on the date of this encounter to include pre-visit review of records, face-to-face time with the patient discussing conditions above, post visit ordering of testing, clinical documentation with the electronic health record, making appropriate referrals as documented, and communicating necessary findings to members of the patients care team.  Noemi Chapel, NP 04/21/2022  Pt aware and understands NP's role.

## 2022-04-21 NOTE — Assessment & Plan Note (Signed)
Increased O2 requirement from 4 L/min to 6 L/min.  She was able to maintain greater than 90% on 6 L in the office.  She will monitor at home for goal greater than 88 to 90%.  Understands that she needs to go to the hospital should she have further increased oxygen requirements.

## 2022-04-21 NOTE — Assessment & Plan Note (Signed)
Unresolving AECOPD now with superimposed infection.  We will treat her with Depo injection and prednisone taper.  She will continue to use triple therapy with Virgel Bouquet and Mikael Spray.  Rx sent for albuterol nebs.  Instructed her to use these twice a day prior to inhaler therapy.  See above plan.

## 2022-04-21 NOTE — Assessment & Plan Note (Signed)
Right controlled at visit.  She is on Xarelto which I suspect is related to the episode of hemoptysis that she had as well as airway irritation.  Advised her to go to the ED if mass hemoptysis this develops.

## 2022-04-21 NOTE — Assessment & Plan Note (Signed)
Right lower lobe opacities consistent with pneumonia.  She was recently hospitalized for sepsis felt to be related to pneumonia.  Imaging at the time was without any acute process.  Treated with IV antibiotics.  She then had recurrence of fevers and increased cough and was treated 10/27 with 7-day course of Augmentin.  She felt some better but never back to her baseline.  Developed productive cough and new fevers 2 to 3 days ago.  Limited on antibiotic choices based on her allergies and current medications.  Unable to treat her with quinolones or macrolides due to her being on Tikosyn.  She is allergic to tetracyclines.  We will treat her with 14-day course of cefpodoxime.  Sputum culture ordered today.  Instructed her on proper collection techniques.  She was nontoxic-appearing with stable heart rate and blood pressure.  We were able to maintain her oxygen on 6 L/min.  She was provided with very strict return/ED precautions.  Instructed on mucociliary clearance therapies.  Close follow-up.  Patient Instructions  Continue Breo 1 puff daily. Brush tongue and rinse mouth afterwards Continue Yupelri 1 vial once daily  Continue Albuterol inhaler 2 puffs or 3 mL neb every 6 hours as needed for shortness of breath or wheezing. Notify if symptoms persist despite rescue inhaler/neb use. Use neb twice daily  Continue astelin 2 sprays each nostril Twice daily  Continue supplemental oxygen 5-6 lpm to maintain saturations >88-90% Continue BiPAP nightly, minimum of 4-6 hours  Cefpodoxime 200 mg Twice daily for 14 days. Take with food  Prednisone taper. 4 tabs for 3 days, then 3 tabs for 3 days, 2 tabs for 3 days, then 1 tab for 3 days, then stop. Take in AM with food. Guaifenesin 600 mg Twice daily for chest congestion Flutter valve 2-3 times a day following breathing treatment  If you develop worsening symptoms or are unable to maintain oxygen levels >88-90%, you need to go to the hospital  Collect sputum  culture and return  Labs today - CBC with diff and BMET   Follow up in 7 days with Dr. Vassie Loll or Philis Nettle. If symptoms do not improve or worsen, please contact office for sooner follow up or seek emergency care.

## 2022-04-21 NOTE — Patient Instructions (Addendum)
Continue Breo 1 puff daily. Brush tongue and rinse mouth afterwards Continue Yupelri 1 vial once daily  Continue Albuterol inhaler 2 puffs or 3 mL neb every 6 hours as needed for shortness of breath or wheezing. Notify if symptoms persist despite rescue inhaler/neb use. Use neb twice daily  Continue astelin 2 sprays each nostril Twice daily  Continue supplemental oxygen 5-6 lpm to maintain saturations >88-90% Continue BiPAP nightly, minimum of 4-6 hours  Cefpodoxime 200 mg Twice daily for 14 days. Take with food  Prednisone taper. 4 tabs for 3 days, then 3 tabs for 3 days, 2 tabs for 3 days, then 1 tab for 3 days, then stop. Take in AM with food. Guaifenesin 600 mg Twice daily for chest congestion Flutter valve 2-3 times a day following breathing treatment  If you develop worsening symptoms or are unable to maintain oxygen levels >88-90%, you need to go to the hospital  Collect sputum culture and return  Labs today - CBC with diff and BMET   Follow up in 7 days with Dr. Vassie Loll or Philis Nettle. If symptoms do not improve or worsen, please contact office for sooner follow up or seek emergency care.

## 2022-04-22 ENCOUNTER — Other Ambulatory Visit: Payer: Self-pay | Admitting: Nurse Practitioner

## 2022-04-22 ENCOUNTER — Telehealth: Payer: Self-pay

## 2022-04-22 MED ORDER — FLUTICASONE FUROATE-VILANTEROL 200-25 MCG/ACT IN AEPB: 1.0000 | INHALATION_SPRAY | Freq: Every day | RESPIRATORY_TRACT | 1 refills | Status: DC

## 2022-04-22 NOTE — Telephone Encounter (Signed)
Yes, that is fine. Breo 200 1 puff daily. Brush tongue and rinse mouth afterwards. 5 refills. Thanks.

## 2022-04-22 NOTE — Telephone Encounter (Signed)
Kathleen Jorja Loa, MD 04/07/2022  7:45 AM EDT Back to Top    Optivol elevated. Enroll in [facility] clinic.

## 2022-04-22 NOTE — Telephone Encounter (Signed)
Referred to ICM clinic by Dr Elberta Fortis due to elevated Optivol.   Attempted call to patient for ICM intro and left message for return call.

## 2022-04-22 NOTE — Telephone Encounter (Signed)
Patient verbalized understanding. Patient verified her pharmacy.   Nothing further needed.

## 2022-04-22 NOTE — Telephone Encounter (Signed)
Called and spoke with patient. Patient stated that she needed breo 200 called into the pharmacy instead of the breo 100. Patient stated that the hospital gave her breo 200.   KC please advise if you are okay with me sending in breo 200 for the pt.

## 2022-04-27 DIAGNOSIS — F321 Major depressive disorder, single episode, moderate: Secondary | ICD-10-CM | POA: Insufficient documentation

## 2022-04-28 ENCOUNTER — Inpatient Hospital Stay (HOSPITAL_COMMUNITY): Payer: Medicare Other

## 2022-04-28 ENCOUNTER — Ambulatory Visit (INDEPENDENT_AMBULATORY_CARE_PROVIDER_SITE_OTHER): Payer: Medicare Other | Admitting: Nurse Practitioner

## 2022-04-28 ENCOUNTER — Inpatient Hospital Stay (HOSPITAL_COMMUNITY)
Admission: EM | Admit: 2022-04-28 | Discharge: 2022-05-04 | DRG: 194 | Disposition: A | Discharge status: Home Health | Payer: Medicare Other | Attending: Internal Medicine | Admitting: Internal Medicine

## 2022-04-28 ENCOUNTER — Ambulatory Visit (INDEPENDENT_AMBULATORY_CARE_PROVIDER_SITE_OTHER): Payer: Medicare Other

## 2022-04-28 ENCOUNTER — Encounter: Payer: Self-pay | Admitting: Nurse Practitioner

## 2022-04-28 ENCOUNTER — Encounter (HOSPITAL_COMMUNITY): Payer: Self-pay | Admitting: Emergency Medicine

## 2022-04-28 VITALS — BP 132/86 | HR 85 | Ht 61.0 in | Wt 183.2 lb

## 2022-04-28 DIAGNOSIS — I10 Essential (primary) hypertension: Secondary | ICD-10-CM | POA: Diagnosis present

## 2022-04-28 DIAGNOSIS — Z87891 Personal history of nicotine dependence: Secondary | ICD-10-CM

## 2022-04-28 DIAGNOSIS — J189 Pneumonia, unspecified organism: Secondary | ICD-10-CM

## 2022-04-28 DIAGNOSIS — R7989 Other specified abnormal findings of blood chemistry: Secondary | ICD-10-CM | POA: Diagnosis present

## 2022-04-28 DIAGNOSIS — J9809 Other diseases of bronchus, not elsewhere classified: Secondary | ICD-10-CM | POA: Diagnosis present

## 2022-04-28 DIAGNOSIS — I2510 Atherosclerotic heart disease of native coronary artery without angina pectoris: Secondary | ICD-10-CM | POA: Diagnosis present

## 2022-04-28 DIAGNOSIS — Z6834 Body mass index (BMI) 34.0-34.9, adult: Secondary | ICD-10-CM | POA: Diagnosis not present

## 2022-04-28 DIAGNOSIS — Z9981 Dependence on supplemental oxygen: Secondary | ICD-10-CM

## 2022-04-28 DIAGNOSIS — Z881 Allergy status to other antibiotic agents status: Secondary | ICD-10-CM

## 2022-04-28 DIAGNOSIS — I480 Paroxysmal atrial fibrillation: Secondary | ICD-10-CM | POA: Diagnosis not present

## 2022-04-28 DIAGNOSIS — M109 Gout, unspecified: Secondary | ICD-10-CM | POA: Diagnosis present

## 2022-04-28 DIAGNOSIS — E1122 Type 2 diabetes mellitus with diabetic chronic kidney disease: Secondary | ICD-10-CM | POA: Diagnosis present

## 2022-04-28 DIAGNOSIS — E785 Hyperlipidemia, unspecified: Secondary | ICD-10-CM | POA: Diagnosis present

## 2022-04-28 DIAGNOSIS — J121 Respiratory syncytial virus pneumonia: Secondary | ICD-10-CM | POA: Diagnosis present

## 2022-04-28 DIAGNOSIS — I252 Old myocardial infarction: Secondary | ICD-10-CM

## 2022-04-28 DIAGNOSIS — I4811 Longstanding persistent atrial fibrillation: Secondary | ICD-10-CM | POA: Diagnosis present

## 2022-04-28 DIAGNOSIS — J329 Chronic sinusitis, unspecified: Secondary | ICD-10-CM | POA: Diagnosis present

## 2022-04-28 DIAGNOSIS — J441 Chronic obstructive pulmonary disease with (acute) exacerbation: Secondary | ICD-10-CM | POA: Diagnosis present

## 2022-04-28 DIAGNOSIS — J9611 Chronic respiratory failure with hypoxia: Secondary | ICD-10-CM | POA: Diagnosis present

## 2022-04-28 DIAGNOSIS — E1140 Type 2 diabetes mellitus with diabetic neuropathy, unspecified: Secondary | ICD-10-CM | POA: Diagnosis present

## 2022-04-28 DIAGNOSIS — Z888 Allergy status to other drugs, medicaments and biological substances status: Secondary | ICD-10-CM

## 2022-04-28 DIAGNOSIS — J8489 Other specified interstitial pulmonary diseases: Secondary | ICD-10-CM | POA: Diagnosis present

## 2022-04-28 DIAGNOSIS — Z7951 Long term (current) use of inhaled steroids: Secondary | ICD-10-CM

## 2022-04-28 DIAGNOSIS — Z9049 Acquired absence of other specified parts of digestive tract: Secondary | ICD-10-CM

## 2022-04-28 DIAGNOSIS — Z7901 Long term (current) use of anticoagulants: Secondary | ICD-10-CM

## 2022-04-28 DIAGNOSIS — G4730 Sleep apnea, unspecified: Secondary | ICD-10-CM | POA: Diagnosis present

## 2022-04-28 DIAGNOSIS — I5042 Chronic combined systolic (congestive) and diastolic (congestive) heart failure: Secondary | ICD-10-CM | POA: Diagnosis present

## 2022-04-28 DIAGNOSIS — G4733 Obstructive sleep apnea (adult) (pediatric): Secondary | ICD-10-CM | POA: Diagnosis present

## 2022-04-28 DIAGNOSIS — Z825 Family history of asthma and other chronic lower respiratory diseases: Secondary | ICD-10-CM

## 2022-04-28 DIAGNOSIS — Z91048 Other nonmedicinal substance allergy status: Secondary | ICD-10-CM

## 2022-04-28 DIAGNOSIS — J398 Other specified diseases of upper respiratory tract: Secondary | ICD-10-CM | POA: Diagnosis present

## 2022-04-28 DIAGNOSIS — J440 Chronic obstructive pulmonary disease with acute lower respiratory infection: Secondary | ICD-10-CM | POA: Diagnosis present

## 2022-04-28 DIAGNOSIS — E669 Obesity, unspecified: Secondary | ICD-10-CM | POA: Diagnosis present

## 2022-04-28 DIAGNOSIS — N1831 Chronic kidney disease, stage 3a: Secondary | ICD-10-CM | POA: Diagnosis present

## 2022-04-28 DIAGNOSIS — Z1152 Encounter for screening for COVID-19: Secondary | ICD-10-CM | POA: Diagnosis not present

## 2022-04-28 DIAGNOSIS — J181 Lobar pneumonia, unspecified organism: Secondary | ICD-10-CM | POA: Diagnosis not present

## 2022-04-28 DIAGNOSIS — I272 Pulmonary hypertension, unspecified: Secondary | ICD-10-CM | POA: Diagnosis present

## 2022-04-28 DIAGNOSIS — I5031 Acute diastolic (congestive) heart failure: Secondary | ICD-10-CM | POA: Diagnosis not present

## 2022-04-28 DIAGNOSIS — I4891 Unspecified atrial fibrillation: Secondary | ICD-10-CM | POA: Diagnosis present

## 2022-04-28 DIAGNOSIS — J210 Acute bronchiolitis due to respiratory syncytial virus: Secondary | ICD-10-CM | POA: Diagnosis not present

## 2022-04-28 DIAGNOSIS — Z887 Allergy status to serum and vaccine status: Secondary | ICD-10-CM

## 2022-04-28 DIAGNOSIS — Z91199 Patient's noncompliance with other medical treatment and regimen due to unspecified reason: Secondary | ICD-10-CM

## 2022-04-28 DIAGNOSIS — Y95 Nosocomial condition: Secondary | ICD-10-CM | POA: Diagnosis present

## 2022-04-28 DIAGNOSIS — I130 Hypertensive heart and chronic kidney disease with heart failure and stage 1 through stage 4 chronic kidney disease, or unspecified chronic kidney disease: Secondary | ICD-10-CM | POA: Diagnosis present

## 2022-04-28 DIAGNOSIS — Z90710 Acquired absence of both cervix and uterus: Secondary | ICD-10-CM

## 2022-04-28 DIAGNOSIS — Z87820 Personal history of traumatic brain injury: Secondary | ICD-10-CM

## 2022-04-28 DIAGNOSIS — J4489 Other specified chronic obstructive pulmonary disease: Secondary | ICD-10-CM | POA: Diagnosis present

## 2022-04-28 DIAGNOSIS — Z950 Presence of cardiac pacemaker: Secondary | ICD-10-CM

## 2022-04-28 DIAGNOSIS — Z79891 Long term (current) use of opiate analgesic: Secondary | ICD-10-CM

## 2022-04-28 DIAGNOSIS — Z79899 Other long term (current) drug therapy: Secondary | ICD-10-CM

## 2022-04-28 LAB — COMPREHENSIVE METABOLIC PANEL
ALT: 14 U/L (ref 0–44)
AST: 14 U/L — ABNORMAL LOW (ref 15–41)
Albumin: 4.1 g/dL (ref 3.5–5.0)
Alkaline Phosphatase: 26 U/L — ABNORMAL LOW (ref 38–126)
Anion gap: 14 (ref 5–15)
BUN: 26 mg/dL — ABNORMAL HIGH (ref 8–23)
CO2: 27 mmol/L (ref 22–32)
Calcium: 10.2 mg/dL (ref 8.9–10.3)
Chloride: 98 mmol/L (ref 98–111)
Creatinine, Ser: 1.01 mg/dL — ABNORMAL HIGH (ref 0.44–1.00)
GFR, Estimated: 58 mL/min — ABNORMAL LOW (ref >60)
Glucose, Bld: 142 mg/dL — ABNORMAL HIGH (ref 70–99)
Potassium: 4 mmol/L (ref 3.5–5.1)
Sodium: 139 mmol/L (ref 135–145)
Total Bilirubin: 0.6 mg/dL (ref 0.3–1.2)
Total Protein: 6.6 g/dL (ref 6.5–8.1)

## 2022-04-28 LAB — LACTIC ACID, PLASMA
Lactic Acid, Venous: 1.9 mmol/L (ref 0.5–1.9)
Lactic Acid, Venous: 1.5 mmol/L (ref 0.5–1.9)

## 2022-04-28 LAB — CBC WITH DIFFERENTIAL/PLATELET
Abs Immature Granulocytes: 0.31 10*3/uL — ABNORMAL HIGH (ref 0.00–0.07)
Basophils Absolute: 0.1 10*3/uL (ref 0.0–0.1)
Basophils Relative: 0 %
Eosinophils Absolute: 0 10*3/uL (ref 0.0–0.5)
Eosinophils Relative: 0 %
HCT: 41.3 % (ref 36.0–46.0)
Hemoglobin: 13.4 g/dL (ref 12.0–15.0)
Immature Granulocytes: 2 %
Lymphocytes Relative: 8 %
Lymphs Abs: 1.1 10*3/uL (ref 0.7–4.0)
MCH: 27.1 pg (ref 26.0–34.0)
MCHC: 32.4 g/dL (ref 30.0–36.0)
MCV: 83.6 fL (ref 80.0–100.0)
Monocytes Absolute: 0.4 10*3/uL (ref 0.1–1.0)
Monocytes Relative: 3 %
Neutro Abs: 11.8 10*3/uL — ABNORMAL HIGH (ref 1.7–7.7)
Neutrophils Relative %: 87 %
Platelets: 281 10*3/uL (ref 150–400)
RBC: 4.94 MIL/uL (ref 3.87–5.11)
RDW: 14 % (ref 11.5–15.5)
WBC: 13.7 10*3/uL — ABNORMAL HIGH (ref 4.0–10.5)
nRBC: 0 % (ref 0.0–0.2)

## 2022-04-28 LAB — RESP PANEL BY RT-PCR (FLU A&B, COVID) ARPGX2
Influenza A by PCR: NEGATIVE
Influenza B by PCR: NEGATIVE
SARS Coronavirus 2 by RT PCR: NEGATIVE

## 2022-04-28 LAB — TROPONIN I (HIGH SENSITIVITY)
Troponin I (High Sensitivity): 6 ng/L (ref <18)
Troponin I (High Sensitivity): 4 ng/L (ref <18)

## 2022-04-28 LAB — BRAIN NATRIURETIC PEPTIDE: B Natriuretic Peptide: 58.7 pg/mL (ref 0.0–100.0)

## 2022-04-28 MED ORDER — SODIUM CHLORIDE 3 % IN NEBU
4.0000 mL | INHALATION_SOLUTION | Freq: Three times a day (TID) | RESPIRATORY_TRACT | Status: DC
  Filled 2022-04-28 (×2): qty 4

## 2022-04-28 MED ORDER — ACETAMINOPHEN 650 MG RE SUPP: 650.0000 mg | Freq: Four times a day (QID) | RECTAL | Status: DC | PRN

## 2022-04-28 MED ORDER — DOFETILIDE 250 MCG PO CAPS
250.0000 ug | ORAL_CAPSULE | Freq: Two times a day (BID) | ORAL | Status: DC
  Filled 2022-04-28: qty 1

## 2022-04-28 MED ORDER — DOCUSATE SODIUM 100 MG PO CAPS
100.0000 mg | ORAL_CAPSULE | Freq: Two times a day (BID) | ORAL | Status: DC
  Administered 2022-04-29 – 2022-05-04 (×11): 100 mg via ORAL
  Filled 2022-04-28 (×11): qty 1

## 2022-04-28 MED ORDER — GUAIFENESIN ER 600 MG PO TB12
600.0000 mg | ORAL_TABLET | Freq: Two times a day (BID) | ORAL | Status: DC | PRN
  Administered 2022-04-29 – 2022-05-01 (×4): 600 mg via ORAL
  Filled 2022-04-28 (×4): qty 1

## 2022-04-28 MED ORDER — POLYETHYLENE GLYCOL 3350 17 G PO PACK
17.0000 g | PACK | Freq: Every day | ORAL | Status: DC | PRN
  Administered 2022-05-02: 17 g via ORAL
  Filled 2022-04-28: qty 1

## 2022-04-28 MED ORDER — SODIUM CHLORIDE 0.9 % IV SOLN
2.0000 g | Freq: Two times a day (BID) | INTRAVENOUS | Status: DC
  Administered 2022-04-29 – 2022-04-30 (×3): 2 g via INTRAVENOUS
  Filled 2022-04-28 (×3): qty 12.5

## 2022-04-28 MED ORDER — DOFETILIDE 250 MCG PO CAPS
250.0000 ug | ORAL_CAPSULE | Freq: Two times a day (BID) | ORAL | Status: DC
  Administered 2022-04-29 – 2022-05-04 (×11): 250 ug via ORAL
  Filled 2022-04-28 (×15): qty 1

## 2022-04-28 MED ORDER — ONDANSETRON HCL 4 MG/2ML IJ SOLN: 4.0000 mg | Freq: Four times a day (QID) | INTRAMUSCULAR | Status: DC | PRN

## 2022-04-28 MED ORDER — LINACLOTIDE 145 MCG PO CAPS
145.0000 ug | ORAL_CAPSULE | Freq: Every day | ORAL | Status: DC | PRN
  Filled 2022-04-28: qty 1

## 2022-04-28 MED ORDER — AZELASTINE HCL 0.1 % NA SOLN
2.0000 | Freq: Two times a day (BID) | NASAL | Status: DC
  Administered 2022-04-29 – 2022-05-04 (×11): 2 via NASAL
  Filled 2022-04-28: qty 30

## 2022-04-28 MED ORDER — LACTATED RINGERS IV SOLN: INTRAVENOUS | Status: DC

## 2022-04-28 MED ORDER — FEBUXOSTAT 40 MG PO TABS
40.0000 mg | ORAL_TABLET | Freq: Every day | ORAL | Status: DC
  Administered 2022-04-29 – 2022-05-03 (×5): 40 mg via ORAL
  Filled 2022-04-28 (×6): qty 1

## 2022-04-28 MED ORDER — REVEFENACIN 175 MCG/3ML IN SOLN: 175.0000 ug | Freq: Every day | RESPIRATORY_TRACT | Status: DC

## 2022-04-28 MED ORDER — FLUTICASONE FUROATE-VILANTEROL 100-25 MCG/ACT IN AEPB
1.0000 | INHALATION_SPRAY | Freq: Every day | RESPIRATORY_TRACT | Status: DC
  Administered 2022-04-29 – 2022-04-30 (×2): 1 via RESPIRATORY_TRACT
  Filled 2022-04-28: qty 28

## 2022-04-28 MED ORDER — IPRATROPIUM-ALBUTEROL 0.5-2.5 (3) MG/3ML IN SOLN
3.0000 mL | RESPIRATORY_TRACT | Status: AC
  Administered 2022-04-28 (×3): 3 mL via RESPIRATORY_TRACT
  Filled 2022-04-28: qty 9

## 2022-04-28 MED ORDER — SODIUM CHLORIDE 0.9% FLUSH
3.0000 mL | Freq: Two times a day (BID) | INTRAVENOUS | Status: DC
  Administered 2022-04-29 – 2022-05-04 (×11): 3 mL via INTRAVENOUS

## 2022-04-28 MED ORDER — TRAMADOL HCL 50 MG PO TABS
50.0000 mg | ORAL_TABLET | Freq: Four times a day (QID) | ORAL | Status: DC | PRN
  Administered 2022-04-30 – 2022-05-01 (×2): 50 mg via ORAL
  Filled 2022-04-28 (×2): qty 1

## 2022-04-28 MED ORDER — SODIUM CHLORIDE 3 % IN NEBU
4.0000 mL | INHALATION_SOLUTION | Freq: Three times a day (TID) | RESPIRATORY_TRACT | Status: DC
  Administered 2022-04-29: 4 mL via RESPIRATORY_TRACT
  Filled 2022-04-28 (×20): qty 4

## 2022-04-28 MED ORDER — OXYCODONE HCL 5 MG PO TABS
5.0000 mg | ORAL_TABLET | ORAL | Status: DC | PRN
  Administered 2022-04-30 (×2): 5 mg via ORAL
  Filled 2022-04-28 (×3): qty 1

## 2022-04-28 MED ORDER — SODIUM CHLORIDE 0.9 % IV SOLN
2.0000 g | Freq: Once | INTRAVENOUS | Status: AC
  Administered 2022-04-28: 2 g via INTRAVENOUS
  Filled 2022-04-28: qty 12.5

## 2022-04-28 MED ORDER — ACETAMINOPHEN 325 MG PO TABS
650.0000 mg | ORAL_TABLET | Freq: Four times a day (QID) | ORAL | Status: DC | PRN
  Administered 2022-04-28: 650 mg via ORAL
  Filled 2022-04-28: qty 2

## 2022-04-28 MED ORDER — VANCOMYCIN HCL 1500 MG/300ML IV SOLN
1500.0000 mg | Freq: Once | INTRAVENOUS | Status: AC
  Administered 2022-04-28: 1500 mg via INTRAVENOUS
  Filled 2022-04-28: qty 300

## 2022-04-28 MED ORDER — ONDANSETRON HCL 4 MG PO TABS: 4.0000 mg | ORAL_TABLET | Freq: Four times a day (QID) | ORAL | Status: DC | PRN

## 2022-04-28 MED ORDER — HYDRALAZINE HCL 20 MG/ML IJ SOLN: 5.0000 mg | INTRAMUSCULAR | Status: DC | PRN

## 2022-04-28 MED ORDER — ALBUTEROL SULFATE (2.5 MG/3ML) 0.083% IN NEBU: 2.5000 mg | INHALATION_SOLUTION | RESPIRATORY_TRACT | Status: DC | PRN

## 2022-04-28 MED ORDER — FLUTICASONE FUROATE-VILANTEROL 100-25 MCG/ACT IN AEPB: 1.0000 | INHALATION_SPRAY | Freq: Every day | RESPIRATORY_TRACT | Status: DC

## 2022-04-28 MED ORDER — DOCUSATE SODIUM 100 MG PO CAPS
100.0000 mg | ORAL_CAPSULE | Freq: Two times a day (BID) | ORAL | Status: DC
  Administered 2022-04-28: 100 mg via ORAL
  Filled 2022-04-28: qty 1

## 2022-04-28 MED ORDER — REVEFENACIN 175 MCG/3ML IN SOLN
175.0000 ug | Freq: Every day | RESPIRATORY_TRACT | Status: DC
  Administered 2022-04-29 – 2022-05-04 (×5): 175 ug via RESPIRATORY_TRACT
  Filled 2022-04-28 (×6): qty 3

## 2022-04-28 MED ORDER — MORPHINE SULFATE (PF) 2 MG/ML IV SOLN: 2.0000 mg | INTRAVENOUS | Status: DC | PRN

## 2022-04-28 MED ORDER — VANCOMYCIN HCL 750 MG/150ML IV SOLN
750.0000 mg | INTRAVENOUS | Status: DC
  Administered 2022-04-29: 750 mg via INTRAVENOUS
  Filled 2022-04-28: qty 150

## 2022-04-28 MED ORDER — ALBUTEROL SULFATE (2.5 MG/3ML) 0.083% IN NEBU
2.5000 mg | INHALATION_SOLUTION | Freq: Four times a day (QID) | RESPIRATORY_TRACT | Status: DC | PRN
  Administered 2022-04-28: 2.5 mg via RESPIRATORY_TRACT

## 2022-04-28 MED ORDER — BISACODYL 5 MG PO TBEC: 5.0000 mg | DELAYED_RELEASE_TABLET | Freq: Every day | ORAL | Status: DC | PRN

## 2022-04-28 MED ORDER — METHYLPREDNISOLONE SODIUM SUCC 125 MG IJ SOLR
125.0000 mg | Freq: Every day | INTRAMUSCULAR | Status: AC
  Administered 2022-04-28 – 2022-04-30 (×3): 125 mg via INTRAVENOUS
  Filled 2022-04-28 (×3): qty 2

## 2022-04-28 MED ORDER — IPRATROPIUM-ALBUTEROL 0.5-2.5 (3) MG/3ML IN SOLN
3.0000 mL | Freq: Four times a day (QID) | RESPIRATORY_TRACT | Status: DC
  Administered 2022-04-28 – 2022-04-30 (×7): 3 mL via RESPIRATORY_TRACT
  Filled 2022-04-28 (×7): qty 3

## 2022-04-28 MED ORDER — RIVAROXABAN 20 MG PO TABS
20.0000 mg | ORAL_TABLET | Freq: Every day | ORAL | Status: DC
  Administered 2022-04-29 – 2022-05-03 (×6): 20 mg via ORAL
  Filled 2022-04-28 (×4): qty 1
  Filled 2022-04-28: qty 2
  Filled 2022-04-28: qty 1

## 2022-04-28 NOTE — Telephone Encounter (Signed)
Attempted ICM referral call to patient and no answer.  Pt seen in ED today, 11/21 for pneumonia.

## 2022-04-28 NOTE — Assessment & Plan Note (Signed)
Unresolved/worsening AECOPD. See above plan.

## 2022-04-28 NOTE — Progress Notes (Signed)
Pt transported from ED18 to CT1 and back by RN and RT w/o complications.

## 2022-04-28 NOTE — ED Provider Triage Note (Signed)
Emergency Medicine Provider Triage Evaluation Note  Kathleen [name] , a 76 y.o. female  was evaluated in triage.  Pt complains of chest pain, shortness of breath. States that she has been struggling with pneumonia for the past month. Just completed 7 days of Augmentin and states she is more short of breath. Also endorses right sided chest tightness. Is on 4 L O2 at baseline, has been bumped up to 5 L since pneumonia diagnosis. Is currently on 6 L. States that she also feels like her abdomen is more swollen than normal like when she has CHF exacerbations. Was seen by her pcp this morning and sent here for evaluation.  Review of Systems  Positive:  Negative:  Physical Exam  BP (!) 131/98 (BP Location: Left Wrist)   Pulse 89   Temp 98 F (36.7 C)   Resp 18   Ht 5\' 1"  (1.549 m)   Wt 83 kg   SpO2 93%   BMI 34.57 kg/m  Gen:   Awake, no distress   Resp:  Normal effort  MSK:   Moves extremities without difficulty  Other:   Medical Decision Making  Medically screening exam initiated at 11:52 AM.  Appropriate orders placed.  Kathleen [name] was informed that the remainder of the evaluation will be completed by another provider, this initial triage assessment does not replace that evaluation, and the importance of remaining in the ED until their evaluation is complete.     [name], PA-C 04/28/22 1155

## 2022-04-28 NOTE — Consult Note (Addendum)
NAME:  Kathleen [name], MRN:  [id number], DOB:  [birth year], LOS: 0 ADMISSION DATE:  04/28/2022, CONSULTATION DATE: 11/21 REFERRING MD: Dr. Ophelia Charter, CHIEF COMPLAINT: Pneumonia  History of Present Illness:  76 year old female with past medical history as below, which is significant for COPD, BOOP, on 4 L home O2, tracheobronchomalacia, OSA noncompliant with home BiPAP, paroxysmal atrial fibrillation on Xarelto and former smoker.  She is a patient of Dr. Vassie Loll in the pulmonary clinic.  Her tracheobronchomalacia has been worked up at [facility] and she was offered a stent trial followed by tracheal bronchoplasty but was considered high risk and she did decided to decline.  She has been noncompliant with NIMV since that time as well.  COPD controller medications include Johnathan Hausen, and albuterol.  She was recently admitted to [facility] from 10/10 to 10/15 and was treated for CAP, and COPD exacerbation with improvement in her symptoms. Symptoms quickly returned after discharge to home. She presented to the pulmonary clinic 11/14 with complaints of recurrent fevers and productive cough. And was again treated with antibiotics and steroids. Returned to clinic 11/21 with worsening symptoms and was sent to the emergency department.   Upon arrival to the ED she was complaining of dyspnea, fatigue, and right sided chest pain. She was requiring 6L [place] to maintain sats in the 90s. Cough is non-productive currently and comes and goes at times. She was admitted to the hospitalist service for treatment of PNA and COPD exacerbation. PCCM has been asked to evaluate.   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   Significant Hospital Events: Including procedures, antibiotic start and stop dates in addition to other pertinent events     Interim History / Subjective:    Objective   Blood pressure (!) 134/111, pulse 69, temperature 98.7 F (37.1 C), temperature source Oral, resp. rate 11, height 5\' 1"  (1.549 m), weight 83 kg, SpO2 100 %.       No intake or output data in the 24 hours ending 04/28/22 1554 Filed Weights   04/28/22 1132  Weight: 83 kg    Examination: General: chronically ill elderly appearing female in NAD HENT: [place]/AT, PERRL, no JVD Lungs: Bilateral expiratory wheeze. Loud barking cough. Cardiovascular: RRR, no MRG Abdomen: Soft, NT, ND Extremities: No acute deformity or edema.  Neuro: Alert, oriented, non-focal.    Resolved Hospital Problem list     Assessment & Plan:   Acute on chronic hypoxia: She has an extensive pulmonary history including COPD, BOOP, Tracheobronchomalacia, and OSA. She has been non-compliant with NIMV. Complaining of URI symptoms for over a month at this point and has failed multiple courses of antibiotics and steroids. Differential is broad, but includes viral illness, bronchiectasis exacerbation. Less likely PNA and COPD flare at this point. Possible COPD exacerbation Possible HAP Concern for bronchiectasis or viral illness exacerbating tracheobronchomalacia.  - Supplemental O2 for sat goal 88-95% - Repeat RVP - Empiric antibiotics and systemic steroids per primary - Yupelri, breo (allergies to budesonide and brovana listed) - CT sinus to assess for chronic sinusitis.  - Considering high res CT chest - Nocturnal BiPAP if she will wear it  Best Practice (right click and "Reselect  all SmartList Selections" daily)   Per primary  Labs   CBC: Recent Labs  Lab 04/28/22 1200  WBC 13.7*  NEUTROABS 11.8*  HGB  13.4  HCT 41.3  MCV 83.6  PLT 281    Basic Metabolic Panel: Recent Labs  Lab 04/28/22 1200  NA 139  K 4.0  CL 98  CO2 27  GLUCOSE 142*  BUN 26*  CREATININE 1.01*  CALCIUM 10.2   GFR: Estimated Creatinine Clearance: 47 mL/min (A) (by C-G formula based on SCr of 1.01 mg/dL (H)). Recent Labs  Lab 04/28/22 1200 04/28/22 1258 04/28/22 1424  WBC 13.7*  --   --   LATICACIDVEN  --  1.9 1.5    Liver Function Tests: Recent Labs  Lab 04/28/22 1200  AST 14*  ALT 14  ALKPHOS 26*  BILITOT 0.6  PROT 6.6  ALBUMIN 4.1   No results for input(s): "LIPASE", "AMYLASE" in the last 168 hours. No results for input(s): "AMMONIA" in the last 168 hours.  ABG    Component Value Date/Time   PHART 7.343 (L) 03/20/2022 1316   PCO2ART 33.4 03/20/2022 1316   PO2ART 87 03/20/2022 1316   HCO3 18.1 (L) 03/20/2022 1316   TCO2 19 (L) 03/20/2022 1316   ACIDBASEDEF 7.0 (H) 03/20/2022 1316   O2SAT 96 03/20/2022 1316     Coagulation Profile: No results for input(s): "INR", "PROTIME" in the last 168 hours.  Cardiac Enzymes: No results for input(s): "CKTOTAL", "CKMB", "CKMBINDEX", "TROPONINI" in the last 168 hours.  HbA1C: Hemoglobin A1C  Date/Time Value Ref Range Status  12/26/2012 06:29 AM 6.6 (H) 4.2 - 6.3 % Final    Comment:    The American Diabetes Association recommends that a primary goal of therapy should be <7% and that physicians should reevaluate the treatment regimen in patients with HbA1c values consistently >8%.    Hgb A1c MFr Bld  Date/Time Value Ref Range Status  08/07/2021 12:19 PM 5.9 4.6 - 6.5 % Final    Comment:    Glycemic Control Guidelines for People with Diabetes:Non Diabetic:  <6%Goal of Therapy: <7%Additional Action Suggested:  >8%   04/22/2021 11:39 AM 6.5 4.6 - 6.5 % Final    Comment:    Glycemic Control Guidelines for People with Diabetes:Non Diabetic:  <6%Goal of Therapy: <7%Additional Action Suggested:  >8%     CBG: No results for input(s):  "GLUCAP" in the last 168 hours.  Review of Systems:    Bolds are positive  Constitutional: weight loss, gain, night sweats, Fevers, chills, fatigue .  HEENT: headaches, Sore throat, sneezing, nasal congestion, post nasal drip, Difficulty swallowing, Tooth/dental problems, visual complaints visual changes, ear ache CV:  chest pain, radiates:,Orthopnea, PND, swelling in lower extremities, dizziness, palpitations, syncope.  GI  heartburn, indigestion, abdominal pain, nausea, vomiting, diarrhea, change in bowel habits, loss of appetite, bloody stools.  Resp: cough, productive: , hemoptysis, dyspnea, chest pain, pleuritic.  Skin: rash or itching or icterus GU: dysuria, change in color of urine, urgency or frequency. flank pain, hematuria  MS: joint pain or swelling. decreased range of motion  Psych: change in mood or affect. depression or anxiety.  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   Surgical History:   Past Surgical History:  Procedure Laterality Date   ABDOMINAL HYSTERECTOMY     APPENDECTOMY     ATRIAL FIBRILLATION ABLATION  07/20/2013   by Dr Christin Fudge   AV nodal ablation  11/01/2013   by Dr Christin Fudge, repeated by Dr Wilford Grist   BREAST BIOPSY Bilateral 1997   negative   CARDIAC CATHETERIZATION     CHOLECYSTECTOMY     HERNIA REPAIR     PACEMAKER INSERTION  06/2017   MDT Viva CRT-P implanted by Dr Christin Fudge after AV nodal ablation,  LV lead could not be placed   PACEMAKER INSERTION   05/2020   with lead bundle   RIGHT/LEFT HEART CATH AND CORONARY ANGIOGRAPHY N/A 03/20/2022   Procedure: RIGHT/LEFT HEART CATH AND CORONARY ANGIOGRAPHY;  Surgeon: [place], Peter M, MD;  Location: [facility] INVASIVE CV LAB;  Service: Cardiovascular;  Laterality: N/A;     Social History:   reports that she quit smoking about 34 years ago. Her smoking use included cigarettes. She has a 20.00 pack-year smoking history. She has been exposed to tobacco smoke. She has never used smokeless tobacco. She reports that she does not drink alcohol and does not use drugs.   Family History:  Her family history includes Breast cancer in her cousin; Breast cancer (age of onset: 29) in her mother; Emphysema in her father; Healthy in her brother; Obesity in her daughter; Pancreatic cancer in her mother; Valvular heart disease in her son. There is no history of Colon cancer, Esophageal cancer, Stomach cancer, Inflammatory bowel disease, Liver disease, or Rectal cancer.   Allergies Allergies  Allergen Reactions   Allopurinol Other (See Comments)    Reaction:  Dizziness    Clindamycin Anaphylaxis and Hives   Flublok [Influenza Vaccine Recombinant] Other (See Comments)    Fever 103 with no alternative explanation day after vaccine.  Clydie Braun Highfill FNP-C   Pneumococcal 13-Val Conj Vacc Itching, Swelling and Rash   Dronedarone Rash   Brovana [Arformoterol]     Caused muscle pain   Budesonide     Caused extreme joint pain   Entresto [Sacubitril-Valsartan] Other (See Comments)    hypotension   Fosamax [Alendronate Sodium] Nausea Only   Jardiance [Empagliflozin]     Caused a vaginal infection   Meperidine Nausea And Vomiting   Microplegia Msa-Msg [Plegisol]     Loopy,diarrhea   Rosuvastatin Other (See Comments)    Reaction:  Muscle spasms    Tetracycline Hives   Adhesive [Tape] Rash   Lovastatin Rash    Other reaction(s): Muscle Pain     Home Medications  Prior to Admission medications   Medication Sig Start  Date End Date Taking? Authorizing Provider  acetaminophen (TYLENOL) 650 MG CR tablet Take 1,300 mg by mouth as needed for pain.    [provider]  albuterol (PROVENTIL) (2.5 MG/3ML) 0.083% nebulizer solution Take 3 mLs (2.5 mg total) by nebulization every 6 (six) hours as needed for wheezing or shortness of breath. 04/21/22   Cobb, Ruby Cola, NP  albuterol (VENTOLIN HFA) 108 (90 Base) MCG/ACT inhaler Inhale 2 puffs into the lungs every 4 (four) hours as needed for wheezing or shortness of breath. 04/06/22   Nyoka Cowden, MD  azelastine (ASTELIN) 0.1 % nasal spray Place 2 sprays into both nostrils 2 (two) times daily. 02/17/22   Oretha Milch, MD  Bempedoic Acid (NEXLETOL) 180 MG TABS Take 1 tablet by mouth daily.  03/25/22   Henson, Vickie L, NP-C  cefpodoxime (VANTIN) 200 MG tablet Take 1 tablet (200 mg total) by mouth 2 (two) times daily for 14 days. 04/21/22 05/05/22  Cobb, Ruby Cola, NP  Cholecalciferol (VITAMIN D3) 50 MCG (2000 UT) TABS Take 2,000 Units by mouth at bedtime.    [provider]  cyanocobalamin (,VITAMIN B-12,) 1000 MCG/ML injection Inject 1 mL (1,000 mcg total) into the muscle every 30 (thirty) days. 11/19/21   [name], MD  docusate sodium (COLACE) 100 MG capsule Take 100 mg by mouth 2 (two) times daily.    [provider]  dofetilide (TIKOSYN) 250 MCG capsule Take 1 capsule (250 mcg total) by mouth in the morning and at bedtime. 04/20/22 04/20/23  Newman Nip, NP  febuxostat (ULORIC) 40 MG tablet TAKE 1 TABLET(40 MG) BY MOUTH EVERY NIGHT Patient taking differently: Take 40 mg by mouth at bedtime. 03/05/22   [name], MD  fluticasone furoate-vilanterol (BREO ELLIPTA) 100-25 MCG/ACT AEPB Inhale 1 puff into the lungs daily. 04/06/22   Nyoka Cowden, MD  fluticasone furoate-vilanterol (BREO ELLIPTA) 200-25 MCG/ACT AEPB INHALE 1 PUFF INTO THE LUNGS DAILY 04/22/22   Nyoka Cowden, MD  linaclotide [facility]) 145 MCG CAPS capsule Take  145 mcg by mouth daily as needed (constipation).    [provider]  losartan (COZAAR) 25 MG tablet TAKE 1 TABLET(25 MG) BY MOUTH AT BEDTIME Patient taking differently: Take 25 mg by mouth at bedtime. 10/01/21   Bensimhon, [name], MD  Magnesium 500 MG TABS Take 500 mg by mouth at bedtime.    [provider]  mupirocin ointment (BACTROBAN) 2 % Apply 1 application. topically 2 (two) times daily. Patient taking differently: Apply 1 application  topically 2 (two) times daily as needed (wound care). 09/09/21   [name], MD  nystatin cream (MYCOSTATIN) Apply to affected area 2 times daily Patient taking differently: Apply 1 Application topically 2 (two) times daily as needed for dry skin. 09/08/21   [name], MD  OXYGEN Inhale 4.5 L into the lungs continuous. Use with resmed ventilator    [provider]  predniSONE (DELTASONE) 10 MG tablet 4 tabs for 3 days, then 3 tabs for 3 days, 2 tabs for 3 days, then 1 tab for 3 days, then stop 04/21/22   Cobb, Ruby Cola, NP  rivaroxaban (XARELTO) 20 MG TABS tablet Take 1 tablet (20 mg total) by mouth daily with supper. 12/29/21   Bensimhon, [name], MD  spironolactone (ALDACTONE) 25 MG tablet TAKE 1 TABLET(25 MG) BY MOUTH EVERY MORNING Patient taking differently: Take 25 mg by mouth daily. 10/28/21   Bensimhon, [name], MD  torsemide (DEMADEX) 20 MG tablet Take 20mg  (1 Tab) Monday and Friday Patient taking differently: Take 20 mg by mouth 2 (two) times a week. Take 20mg  (1 Tab) Monday and Friday 06/24/21   Bensimhon, Wednesday, MD  traMADol (ULTRAM) 50 MG tablet Take 50 mg by mouth every 6 (six) hours as needed for pain. 02/02/22   [provider]  YUPELRI 175 MCG/3ML nebulizer solution USE 1 VIAL IN NEBULIZER DAILY - DO NOT MIX WITH OTHER NEB MEDS,USE BEFORE OR AFTER Patient taking differently: Take 175 mcg by nebulization daily. 04/02/21   02/04/22, MD          04/04/21, AGACNP-BC [place]  [facility]  See Amion for personal pager PCCM on call pager [phone] until 7pm. Please call  [facility] 7p-7a. [phone]  04/28/2022 5:02 PM

## 2022-04-28 NOTE — ED Triage Notes (Addendum)
Pt sent from PCP for possible PNA. Pt wears 5L O2 at baseline. Pt recently on abx for PNA. Endorses right shoulder blade pain, SOB and unproductive cough.

## 2022-04-28 NOTE — H&P (Signed)
History and Physical    Patient: Kathleen [name]:[id number] DOB: 10-07-1945 DOA: 04/28/2022 DOS: the patient was seen and examined on 04/28/2022 PCP: Kathleen Grandchild, MD  Patient coming from: Home - lives alone; NOK: Daughter, Kathleen [name], [phone]   Chief Complaint: SOB  HPI: Kathleen [name] is a 76 y.o. female with medical history significant of afib, BOOP, chronic combined CHF, tracheobronchomalacia, COPD, HTN, OSA on BIPAP, and pacemaker placement presenting with SOB.  She was seen by pulmonology on 11/14 and again this AM; she was treated in the interim with cefpodoxime but has continued to feel worse.   "Same thing's that been going on."  She had PNA and it wasn't getting better.  Saw pulm last week, given more antibiotics.  She went to pulm this AM and they sent her in.  She just wasn't improving.  "It NEVER leaves."   R lung is not moving air.  No fevers, "I run a low-grade almost all the time."  Cough is nonproductive mostly.  She has been able to cough a little bit of sputum up.  +wheezing, more than at baseline.  She feels pretty bad all the time.  She is currently on 5L, moved up to 4L within the last year.  She was on 2L prior to moving to [place] 1.5 years ago.    ER Course:  Sent from pulm - SOB, cough, PNA not responding to outpatient therapy.  Needing increased O2.  Also with COPD exacerbation.  Given Vanc/Cefepime.     Review of Systems: As mentioned in the history of present illness. All other systems reviewed and are negative. 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    Concussion 10/04/2021   COPD (chronic obstructive pulmonary disease) (HCC)    Gout    Hypertension    Longstanding persistent atrial fibrillation (HCC)    on Xarelto   Nonischemic  cardiomyopathy (HCC)    Obesity    Pacemaker    Spontaneous pneumothorax 2013   Past Surgical History:  Procedure Laterality Date   ABDOMINAL HYSTERECTOMY     APPENDECTOMY     ATRIAL FIBRILLATION ABLATION  07/20/2013   by Dr Christin Fudge   AV nodal ablation  11/01/2013   by Dr Christin Fudge, repeated by Dr Wilford Grist   BREAST BIOPSY Bilateral 1997   negative   CARDIAC CATHETERIZATION     CHOLECYSTECTOMY     HERNIA REPAIR     PACEMAKER INSERTION  06/2017   MDT Viva CRT-P implanted by Dr Christin Fudge after AV nodal ablation,  LV lead could not be placed   PACEMAKER INSERTION  05/2020   with lead bundle   RIGHT/LEFT HEART CATH AND CORONARY ANGIOGRAPHY N/A 03/20/2022   Procedure: RIGHT/LEFT HEART CATH AND CORONARY ANGIOGRAPHY;  Surgeon: [place], Peter M, MD;  Location: [facility] INVASIVE CV LAB;  Service: Cardiovascular;  Laterality: N/A;   Social History:  reports that she quit smoking about 34 years ago. Her smoking use included cigarettes. She has a 20.00 pack-year smoking history. She has been exposed to tobacco smoke. She has never used smokeless tobacco. She reports that she does not drink alcohol and does not use drugs.  Allergies  Allergen Reactions   Allopurinol Other (See Comments)    Reaction:  Dizziness    Clindamycin  Anaphylaxis and Hives   Flublok [Influenza Vaccine Recombinant] Other (See Comments)    Fever 103 with no alternative explanation day after vaccine.  Clydie Braun Highfill FNP-C   Pneumococcal 13-Val Conj Vacc Itching, Swelling and Rash   Dronedarone Rash   Brovana [Arformoterol]     Caused muscle pain   Budesonide     Caused extreme joint pain   Entresto [Sacubitril-Valsartan] Other (See Comments)    hypotension   Fosamax [Alendronate Sodium] Nausea Only   Jardiance [Empagliflozin]     Caused a vaginal infection   Meperidine Nausea And Vomiting   Microplegia Msa-Msg [Plegisol]     Loopy,diarrhea   Rosuvastatin Other (See Comments)    Reaction:  Muscle spasms    Tetracycline  Hives   Adhesive [Tape] Rash   Lovastatin Rash and Other (See Comments)    Muscle Pain    Family History  Problem Relation Age of Onset   Breast cancer Mother 71       3 different times   Pancreatic cancer Mother    Emphysema Father    Healthy Brother    Breast cancer Cousin    Valvular heart disease Son    Obesity Daughter    Colon cancer Neg Hx    Esophageal cancer Neg Hx    Stomach cancer Neg Hx    Inflammatory bowel disease Neg Hx    Liver disease Neg Hx    Rectal cancer Neg Hx     Prior to Admission medications   Medication Sig Start Date End Date Taking? Authorizing Provider  acetaminophen (TYLENOL) 650 MG CR tablet Take 1,300 mg by mouth as needed for pain.    [provider]  albuterol (PROVENTIL) (2.5 MG/3ML) 0.083% nebulizer solution Take 3 mLs (2.5 mg total) by nebulization every 6 (six) hours as needed for wheezing or shortness of breath. 04/21/22   Cobb, Ruby Cola, NP  albuterol (VENTOLIN HFA) 108 (90 Base) MCG/ACT inhaler Inhale 2 puffs into the lungs every 4 (four) hours as needed for wheezing or shortness of breath. 04/06/22   Nyoka Cowden, MD  azelastine (ASTELIN) 0.1 % nasal spray Place 2 sprays into both nostrils 2 (two) times daily. 02/17/22   Oretha Milch, MD  Bempedoic Acid (NEXLETOL) 180 MG TABS Take 1 tablet by mouth daily. 03/25/22   Henson, Vickie L, NP-C  cefpodoxime (VANTIN) 200 MG tablet Take 1 tablet (200 mg total) by mouth 2 (two) times daily for 14 days. 04/21/22 05/05/22  Cobb, Ruby Cola, NP  Cholecalciferol (VITAMIN D3) 50 MCG (2000 UT) TABS Take 2,000 Units by mouth at bedtime.    [provider]  cyanocobalamin (,VITAMIN B-12,) 1000 MCG/ML injection Inject 1 mL (1,000 mcg total) into the muscle every 30 (thirty) days. 11/19/21   Kathleen Grandchild, MD  docusate sodium (COLACE) 100 MG capsule Take 100 mg by mouth 2 (two) times daily.    [provider]  dofetilide (TIKOSYN) 250 MCG capsule Take 1 capsule (250 mcg  total) by mouth in the morning and at bedtime. 04/20/22 04/20/23  Newman Nip, NP  febuxostat (ULORIC) 40 MG tablet TAKE 1 TABLET(40 MG) BY MOUTH EVERY NIGHT Patient taking differently: Take 40 mg by mouth at bedtime. 03/05/22   Kathleen Grandchild, MD  fluticasone furoate-vilanterol (BREO ELLIPTA) 100-25 MCG/ACT AEPB Inhale 1 puff into the lungs daily. 04/06/22   Nyoka Cowden, MD  fluticasone furoate-vilanterol (BREO ELLIPTA) 200-25 MCG/ACT AEPB INHALE 1 PUFF INTO THE LUNGS DAILY 04/22/22  Nyoka Cowden, MD  linaclotide [facility]) 145 MCG CAPS capsule Take 145 mcg by mouth daily as needed (constipation).    [provider]  losartan (COZAAR) 25 MG tablet TAKE 1 TABLET(25 MG) BY MOUTH AT BEDTIME Patient taking differently: Take 25 mg by mouth at bedtime. 10/01/21   Bensimhon, [name], MD  Magnesium 500 MG TABS Take 500 mg by mouth at bedtime.    [provider]  mupirocin ointment (BACTROBAN) 2 % Apply 1 application. topically 2 (two) times daily. Patient taking differently: Apply 1 application  topically 2 (two) times daily as needed (wound care). 09/09/21   Kathleen Grandchild, MD  nystatin cream (MYCOSTATIN) Apply to affected area 2 times daily Patient taking differently: Apply 1 Application topically 2 (two) times daily as needed for dry skin. 09/08/21   Kathleen Grandchild, MD  OXYGEN Inhale 4.5 L into the lungs continuous. Use with resmed ventilator    [provider]  predniSONE (DELTASONE) 10 MG tablet 4 tabs for 3 days, then 3 tabs for 3 days, 2 tabs for 3 days, then 1 tab for 3 days, then stop 04/21/22   Cobb, Ruby Cola, NP  rivaroxaban (XARELTO) 20 MG TABS tablet Take 1 tablet (20 mg total) by mouth daily with supper. 12/29/21   Bensimhon, [name], MD  spironolactone (ALDACTONE) 25 MG tablet TAKE 1 TABLET(25 MG) BY MOUTH EVERY MORNING Patient taking differently: Take 25 mg by mouth daily. 10/28/21   Bensimhon, [name], MD  torsemide (DEMADEX) 20 MG tablet Take 20mg   (1 Tab) Monday and Friday Patient taking differently: Take 20 mg by mouth 2 (two) times a week. Take 20mg  (1 Tab) Monday and Friday 06/24/21   Bensimhon, Saturday, MD  traMADol (ULTRAM) 50 MG tablet Take 50 mg by mouth every 6 (six) hours as needed for pain. 02/02/22   [provider]  YUPELRI 175 MCG/3ML nebulizer solution USE 1 VIAL IN NEBULIZER DAILY - DO NOT MIX WITH OTHER NEB MEDS,USE BEFORE OR AFTER Patient taking differently: Take 175 mcg by nebulization daily. 04/02/21   02/04/22, MD    Physical Exam: Vitals:   04/28/22 1530 04/28/22 1600 04/28/22 1638 04/28/22 1736  BP: (!) 116/99 135/82    Pulse: 80 77    Resp: (!) 21 19    Temp:   (!) 97.2 F (36.2 C) (!) 100.6 F (38.1 C)  TempSrc:   Oral Oral  SpO2: 100% 98%    Weight:      Height:       General:  Appears chronically ill with a harsh very coarse cough periodically through evaluation Eyes:  PERRL, EOMI, normal lids, iris ENT:  grossly normal hearing, lips & tongue, mmm Neck:  no LAD, masses or thyromegaly Cardiovascular:  RRR, no m/r/g. No LE edema.  Respiratory:   Diffuse wheezing, poor air movement, harsh and violent cough.  Normal to mildly increased respiratory effort, on 5L [place] O2. Abdomen:  soft, NT, ND Skin:  no rash or induration seen on limited exam Musculoskeletal:  grossly normal tone BUE/BLE, good ROM, no bony abnormality Psychiatric:  blunted mood and affect, speech fluent and appropriate, AOx3 Neurologic:  CN 2-12 grossly intact, moves all extremities in coordinated fashion   Radiological Exams on Admission: Independently reviewed - see discussion in A/P where applicable  DG Chest 2 View  Result Date: 04/28/2022 CLINICAL DATA:  RLL pna; worsening dyspnea/cough despite abx therapy EXAM: CHEST - 2 VIEW COMPARISON:  Radiograph 06/21/2021 FINDINGS:  Unchanged cardiomediastinal silhouette and pacemaker leads. Unchanged CardioMEMS device. There is no focal airspace consolidation. Slight  improvement in right lower lung airspace disease. Blunted costophrenic sulci suggesting trace effusions. No evidence of pneumothorax. No acute osseous abnormality. Thoracic spondylosis. IMPRESSION: Slight improvement right lower lung airspace disease. Blunted costophrenic sulci suggesting trace effusions. Electronically Signed   By: Caprice Renshaw M.D.   On: 04/28/2022 10:20    EKG: Independently reviewed.  Ventricular paced with rate 76; NSCSLT   Labs on Admission: I have personally reviewed the available labs and imaging studies at the time of the admission.  Pertinent labs:    Glucose 142 BUN 26/Creatinine 1.01/GFR 58 BNP 58.7 HS troponin 6 Lactate 1.9 WBC 13.7 COVID pending Blood cultures x 2 pending   Assessment and Plan: Principal Problem:   CAP (community acquired pneumonia) Active Problems:   HTN (hypertension)   Atrial fibrillation (HCC)   Sleep apnea   Type 2 diabetes mellitus with diabetic neuropathy, without long-term current use of insulin (HCC)   Stage 3a chronic kidney disease (HCC)   Hyperlipidemia with target LDL less than 100   COPD with acute exacerbation (HCC)   Tracheobronchomalacia    Refractory PNA -Patient with complicated medical history, recently treated with antibiotics, steroids, cough suppressants but still having symptoms -CXR with non-resolving PNA -Currently with low-grade fever -Horrible cough -Influenza negative. -COVID-19 negative. -Respiratory virus panel ordered to include pertussis -Will order lower respiratory tract procalcitonin level.   -CURB-65 score is 2 - will admit the patient to progressive care -Pneumonia Severity Index (PSI) is Class 3, 1% mortality. -The patient has failed outpatient therapy; will treat with Cefepime and Vancomycin. -LR @ 50cc/hr x 10 hours -Fever control -Repeat CBC in am -Will add albuterol PRN -Will add standing Duonebs -Will add Mucinex for cough  Tracheobronchomalacia/OSA -Wears nocturnal  BIPAP -Cough is likely related at least in part to this issue -Continue BIPAP qhs and prn  COPD -She appears quite tight with wheezing -Suspect that there is a COPD component contributing to presentation -Will add steroids to antibiotics -Standing Duonebs, prn  -Continue Breo, Yupelri  Chronic combined CHF -Appears compensated -Will hold spironolactone, toresemide for now  HTN -Hold losartan; generally, ACE is associated with cough but not ARB but will hold anyway  Afib -Continue Tikosyn -Continue Xarelto  DM -Last A1c was 5.9 -Not on medications -Will follow for now without coverage but if steroids increase her glucose this may need to be added  Stage 3a CKD -Appears to be stable at this time -Attempt to avoid nephrotoxic medications -Recheck BMP in AM      Advance Care Planning:   Code Status: Full Code   Consults: Pulmonology; RT; TOC team; nutrition; PT/OT  DVT Prophylaxis: Xarelto  Family Communication: Son (works for [employer]) was present throughout evalaution  Severity of Illness: The appropriate patient status for this patient is INPATIENT. Inpatient status is judged to be reasonable and necessary in order to provide the required intensity of service to ensure the patient's safety. The patient's presenting symptoms, physical exam findings, and initial radiographic and laboratory data in the context of their chronic comorbidities is felt to place them at high risk for further clinical deterioration. Furthermore, it is not anticipated that the patient will be medically stable for discharge from the hospital within 2 midnights of admission.   * I certify that at the point of admission it is my clinical judgment that the patient will require inpatient hospital care spanning beyond 2  midnights from the point of admission due to high intensity of service, high risk for further deterioration and high frequency of surveillance required.*  Author: Jonah Blue,  MD 04/28/2022 6:39 PM  For on call review [email or web address].

## 2022-04-28 NOTE — Assessment & Plan Note (Signed)
Hospitalized 03/17/2022 to 03/22/2022 for sepsis felt to be related to pneumonia.  She was treated with IV antibiotics and steroids for AECOPD.  She was discharged after clinically improving.  Had worsening symptoms and was started on Augmentin 7-day course on 10/27, which she completed around 11/3.  Unfortunately, she had recurrent fevers, developed a more productive cough and had worsening fatigue.  She was seen 11/14 and started on 14-day course of cefpodoxime due to worsening right lower lobe pneumonia on imaging.  She is also started on prednisone taper for unresolved AECOPD.  Labs were stable at the time. Today, she has clinically worsened.  CXR did show some slight improvement in the right lower lobe opacities; however, there are new trace bilateral pleural effusions, likely parapneumonic.  Still has a week left cefpodoxime.  She was treated with albuterol neb and Depo injection.  Minimal improvement after neb.  At this point, she has failed outpatient treatment.  Limited on antibiotics given her cardiac medications.  She was advised that she will likely need hospital admission for further treatment.  EMS transport declined by patient.  Vital signs were stable and she was able to maintain saturations on 5 L continuous O2 so son will take her via private vehicle.

## 2022-04-28 NOTE — ED Provider Notes (Signed)
[facility] EMERGENCY DEPARTMENT Provider Note   CSN: [id number] Arrival date & time: 04/28/22  1124     History  Chief Complaint  Patient presents with   Shortness of Breath    Kathleen [name] is a 76 y.o. female.  HPI     76yo female with history of asthma/COPD, tracheobronchomalacia, chronic respiratory failure on supplemental O2 5L, OSA on BiPAP, NSTEMI, htn, PAF on xarelto, CHF, type 2 DM, hlpd, obesity, recent hospitalization with fevers, elevated troponins with nonobstructive CAD, mild pulmonary hypertension, who presents with concern for continued shortness of breath, fatigue, cough after being referred to ED from pulmonology clinic.  She was recently hospitalized from October 10 to 15 for sepsis felt likely secondary to pneumonia with fevers up to 103 and a dry cough.  CTA was negative for PE and acute airspace disease and was treated for COPD with steroids and IV antibiotics was able to wean to her supplemental O2, contacted the pulmonology office October 27 with recurrent fevers and increased cough and was given Augmentin for 7 days, saw Dr. Sharee Pimple on October 30 and was advised to finish the antibiotics.  11/14 saw Cobb NP for follow up and had recurrent fevers. Had completed abx 11/3, increased home o2 to 5 from 4.  Today, was seen in pulm clinic on day 7 of cefpodoxime for worsening right sided pneumonia noted 11/14.  She has had worsening fatigue, dyspnea despite the abx and was sent to the ED for likely admission and treatment.   She reports some right sided chest pain taht improves when she moves her shoulder in certain ways. Feels it in her back/right shoulder blade. Cough rattling but difficult to raise the sputum.  No increasing leg swelling. No fevers since last week.   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 Authorizing Provider  acetaminophen (TYLENOL) 650 MG CR tablet Take 1,300 mg by mouth as needed for pain.    [provider]  albuterol (PROVENTIL) (2.5 MG/3ML) 0.083% nebulizer solution Take 3 mLs (2.5 mg total) by nebulization every 6 (six) hours as needed for wheezing or shortness of breath. 04/21/22   Cobb, Ruby Cola, NP  albuterol (VENTOLIN HFA) 108 (90 Base) MCG/ACT inhaler Inhale 2 puffs into the lungs every 4 (four) hours as needed for wheezing or shortness of breath. 04/06/22   Nyoka Cowden, MD  azelastine (ASTELIN) 0.1 % nasal spray Place 2 sprays into both nostrils 2 (two) times daily. 02/17/22   Oretha Milch, MD  Bempedoic Acid (NEXLETOL) 180 MG TABS Take 1 tablet by mouth daily. 03/25/22   Henson, Vickie L, NP-C  cefpodoxime (VANTIN) 200 MG tablet Take 1 tablet (200 mg total) by mouth 2 (two) times daily for 14 days. 04/21/22 05/05/22  Cobb, Ruby Cola, NP  Cholecalciferol (VITAMIN D3) 50 MCG (2000 UT) TABS  Take 2,000 Units by mouth at bedtime.    [provider]  cyanocobalamin (,VITAMIN B-12,) 1000 MCG/ML injection Inject 1 mL (1,000 mcg total) into the muscle every 30 (thirty) days. 11/19/21   [name], MD  docusate sodium (COLACE) 100 MG capsule Take 100 mg by mouth 2 (two) times daily.    [provider]  dofetilide (TIKOSYN) 250 MCG capsule Take 1 capsule (250 mcg total) by mouth in the morning and at bedtime. 04/20/22 04/20/23  Newman Nip, NP  febuxostat (ULORIC) 40 MG tablet TAKE 1  TABLET(40 MG) BY MOUTH EVERY NIGHT Patient taking differently: Take 40 mg by mouth at bedtime. 03/05/22   [name], MD  fluticasone furoate-vilanterol (BREO ELLIPTA) 100-25 MCG/ACT AEPB Inhale 1 puff into the lungs daily. 04/06/22   Nyoka Cowden, MD  fluticasone furoate-vilanterol (BREO ELLIPTA) 200-25 MCG/ACT AEPB INHALE 1 PUFF INTO THE LUNGS DAILY 04/22/22   Nyoka Cowden, MD  linaclotide [facility]) 145 MCG CAPS capsule Take 145 mcg by mouth daily as needed (constipation).    [provider]  losartan (COZAAR) 25 MG tablet TAKE 1 TABLET(25 MG) BY MOUTH AT BEDTIME Patient taking differently: Take 25 mg by mouth at bedtime. 10/01/21   Bensimhon, [name], MD  Magnesium 500 MG TABS Take 500 mg by mouth at bedtime.    [provider]  mupirocin ointment (BACTROBAN) 2 % Apply 1 application. topically 2 (two) times daily. Patient taking differently: Apply 1 application  topically 2 (two) times daily as needed (wound care). 09/09/21   [name], MD  nystatin cream (MYCOSTATIN) Apply to affected area 2 times daily Patient taking differently: Apply 1 Application topically 2 (two) times daily as needed for dry skin. 09/08/21   [name], MD  OXYGEN Inhale 4.5 L into the lungs continuous. Use with resmed ventilator    [provider]  predniSONE (DELTASONE) 10 MG tablet 4 tabs for 3 days, then 3 tabs for 3 days, 2 tabs for 3 days, then 1 tab for 3 days, then stop 04/21/22   Cobb, Ruby Cola, NP  rivaroxaban (XARELTO) 20 MG TABS tablet Take 1 tablet (20 mg total) by mouth daily with supper. 12/29/21   Bensimhon, [name], MD  spironolactone (ALDACTONE) 25 MG tablet TAKE 1 TABLET(25 MG) BY MOUTH EVERY MORNING Patient taking differently: Take 25 mg by mouth daily. 10/28/21   Bensimhon, [name], MD  torsemide (DEMADEX) 20 MG tablet Take 20mg  (1 Tab) Monday and Friday Patient taking differently: Take 20 mg by mouth 2 (two) times a week. Take 20mg  (1 Tab) Monday and  Friday 06/24/21   Bensimhon, [name], MD  traMADol (ULTRAM) 50 MG tablet Take 50 mg by mouth every 6 (six) hours as needed for pain. 02/02/22   [provider]  YUPELRI 175 MCG/3ML nebulizer solution USE 1 VIAL IN NEBULIZER DAILY - DO NOT MIX WITH OTHER NEB MEDS,USE BEFORE OR AFTER Patient taking differently: Take 175 mcg by nebulization daily. 04/02/21   Martina Sinner, MD      Allergies    Allopurinol, Clindamycin, Flublok [influenza vaccine recombinant], Pneumococcal 13-val conj vacc, Dronedarone, Brovana [arformoterol], Budesonide, Entresto [sacubitril-valsartan], Fosamax [alendronate sodium], Jardiance [empagliflozin], Meperidine, Microplegia msa-msg [plegisol], Rosuvastatin, Tetracycline, Adhesive [tape], and Lovastatin    Review of Systems   Review of Systems  Physical Exam Updated Vital Signs BP 130/63   Pulse 77   Temp 98.7 F (37.1 C) (Oral)   Resp 20  Ht 5\' 1"  (1.549 m)   Wt 83 kg   SpO2 97%   BMI 34.57 kg/m  Physical Exam Vitals and nursing note reviewed.  Constitutional:      General: She is not in acute distress.    Appearance: She is well-developed. She is not diaphoretic.  HENT:     Head: Normocephalic and atraumatic.  Eyes:     Conjunctiva/sclera: Conjunctivae normal.  Cardiovascular:     Rate and Rhythm: Normal rate and regular rhythm.     Heart sounds: Normal heart sounds. No murmur heard.    No friction rub. No gallop.  Pulmonary:     Effort: Pulmonary effort is normal. No respiratory distress.     Breath sounds: Wheezing and rhonchi present. No rales.  Abdominal:     General: There is no distension.     Palpations: Abdomen is soft.     Tenderness: There is no abdominal tenderness. There is no guarding.  Musculoskeletal:        General: No tenderness.     Cervical back: Normal range of motion.     Right lower leg: No edema.     Left lower leg: No edema.  Skin:    General: Skin is warm and dry.     Findings: No erythema or rash.   Neurological:     Mental Status: She is alert and oriented to person, place, and time.     ED Results / Procedures / Treatments   Labs (all labs ordered are listed, but only abnormal results are displayed) Labs Reviewed  CBC WITH DIFFERENTIAL/PLATELET - Abnormal; Notable for the following components:      Result Value   WBC 13.7 (*)    Neutro Abs 11.8 (*)    Abs Immature Granulocytes 0.31 (*)    All other components within normal limits  COMPREHENSIVE METABOLIC PANEL - Abnormal; Notable for the following components:   Glucose, Bld 142 (*)    BUN 26 (*)    Creatinine, Ser 1.01 (*)    AST 14 (*)    Alkaline Phosphatase 26 (*)    GFR, Estimated 58 (*)    All other components within normal limits  CULTURE, BLOOD (ROUTINE X 2)  CULTURE, BLOOD (ROUTINE X 2)  RESP PANEL BY RT-PCR (FLU A&B, COVID) ARPGX2  MRSA NEXT GEN BY PCR, NASAL  BRAIN NATRIURETIC PEPTIDE  LACTIC ACID, PLASMA  LACTIC ACID, PLASMA  TROPONIN I (HIGH SENSITIVITY)  TROPONIN I (HIGH SENSITIVITY)    EKG EKG Interpretation  Date/Time:  Tuesday April 28 2022 11:27:48 EST Ventricular Rate:  76 PR Interval:    QRS Duration: 152 QT Interval:  448 QTC Calculation: 504 R Axis:   -72 Text Interpretation: Ventricular-paced rhythm Abnormal ECG When compared with ECG of 22-Mar-2022 10:02, PREVIOUS ECG IS PRESENT No significant change since last tracing Confirmed by 24-Mar-2022 (Alvira Monday) on 04/28/2022 1:25:47 PM  Radiology DG Chest 2 View  Result Date: 04/28/2022 CLINICAL DATA:  RLL pna; worsening dyspnea/cough despite abx therapy EXAM: CHEST - 2 VIEW COMPARISON:  Radiograph 06/21/2021 FINDINGS: Unchanged cardiomediastinal silhouette and pacemaker leads. Unchanged CardioMEMS device. There is no focal airspace consolidation. Slight improvement in right lower lung airspace disease. Blunted costophrenic sulci suggesting trace effusions. No evidence of pneumothorax. No acute osseous abnormality. Thoracic spondylosis.  IMPRESSION: Slight improvement right lower lung airspace disease. Blunted costophrenic sulci suggesting trace effusions. Electronically Signed   By: 06/23/2021 M.D.   On: 04/28/2022 10:20    Procedures Procedures  Medications Ordered in ED Medications  vancomycin (VANCOREADY) IVPB 1500 mg/300 mL (has no administration in time range)  methylPREDNISolone sodium succinate (SOLU-MEDROL) 125 mg/2 mL injection 125 mg (125 mg Intravenous Given 04/28/22 1358)  ipratropium-albuterol (DUONEB) 0.5-2.5 (3) MG/3ML nebulizer solution 3 mL (has no administration in time range)  ceFEPIme (MAXIPIME) 2 g in sodium chloride 0.9 % 100 mL IVPB (0 g Intravenous Stopped 04/28/22 1352)    ED Course/ Medical Decision Making/ A&P                            76yo female with history of asthma/COPD, tracheobronchomalacia, chronic respiratory failure on supplemental O2 5L, OSA on BiPAP, NSTEMI, htn, PAF on xarelto, CHF, type 2 DM, hlpd, obesity, recent hospitalization with fevers, elevated troponins with nonobstructive CAD, mild pulmonary hypertension, who presents with concern for continued shortness of breath, fatigue, cough after being referred to ED from pulmonology clinic.  Differential diagnosis for dyspnea includes ACS, PE, COPD exacerbation, CHF exacerbation, anemia, pneumonia, viral etiology such as COVID 19 infection, metabolic abnormality.    Chest x-ray was done earlier today and reviewed showing improvement however possible effusions with blunted costophrenic angle and some continued opacities.  EKG was evaluated by me which showed paced rhythm no change.  BNP was less than previous. Troponin less than previous and normal.  She is on anticoagulation, had recent CT in October and have low suspicoin at this time for PE. Low suspicion for ACS, PE, CHF exacerbation.    Labs reviewed and personally evaluated by me show slight increase in Cr, mild leukocytosis, stable hgb.  Given symptoms of cough, dyspnea,  worsening despite outpatient antibiotics and COPD treatment as an outpatient, will admit for failed outpatient treatment of pneumonia.  Blood cx, lactate sent.  Ordered vanc/cefepime as well as solumedrol and duonebs.         Final Clinical Impression(s) / ED Diagnoses Final diagnoses:  HCAP (healthcare-associated pneumonia)  COPD exacerbation (HCC)    Rx / DC Orders ED Discharge Orders     None         Alvira Monday, MD 04/28/22 1400

## 2022-04-28 NOTE — ED Notes (Signed)
MD Ophelia Charter notified and aware of pt 100.6 fever.  Tylenol administered per standing orders.  No new orders at this time.

## 2022-04-28 NOTE — Progress Notes (Signed)
Pharmacy Antibiotic Note  Kathleen [name] is a 75 y.o. female admitted on 04/28/2022 with pneumonia.  Pharmacy has been consulted for vancomycin and cefepime dosing.  Patient presenting with shortness of breath and increased oxygen requirements. Recently diagnosed with pneumonia and treated outpatient with augmentin and cefpodoxime. On admission, patient is afebrile, WBC of 13.7, and scr 1.01 which is elevated from baseline (~0.7-0.8). Team would like to start broad spectrum antibiotics.   Plan: Start cefepime 2g IV q12 hours Give vancomycin 1500 mg IV x1, then 750 mg IV q24 hours (Scr 1.01, eAUC 523, Vd 0.5) Monitor clinical status, renal function, culture data, and LOT  Height: 5\' 1"  (154.9 cm) Weight: 83 kg (182 lb 15.7 oz) IBW/kg (Calculated) : 47.8  Temp (24hrs), Avg:98.4 F (36.9 C), Min:98 F (36.7 C), Max:98.7 F (37.1 C)  Recent Labs  Lab 04/28/22 1200  WBC 13.7*  CREATININE 1.01*    Estimated Creatinine Clearance: 47 mL/min (A) (by C-G formula based on SCr of 1.01 mg/dL (H)).    Allergies  Allergen Reactions   Allopurinol Other (See Comments)    Reaction:  Dizziness    Clindamycin Anaphylaxis and Hives   Flublok [Influenza Vaccine Recombinant] Other (See Comments)    Fever 103 with no alternative explanation day after vaccine.  04/30/22 [place] FNP-C   Pneumococcal 13-Val Conj Vacc Itching, Swelling and Rash   Dronedarone Rash   Brovana [Arformoterol]     Caused muscle pain   Budesonide     Caused extreme joint pain   Entresto [Sacubitril-Valsartan] Other (See Comments)    hypotension   Fosamax [Alendronate Sodium] Nausea Only   Jardiance [Empagliflozin]     Caused a vaginal infection   Meperidine Nausea And Vomiting   Microplegia Msa-Msg [Plegisol]     Loopy,diarrhea   Rosuvastatin Other (See Comments)    Reaction:  Muscle spasms    Tetracycline Hives   Adhesive [Tape] Rash   Lovastatin Rash    Other reaction(s): Muscle Pain    Antimicrobials this  admission: Cefepime 11/21 >> Vancomycin 11/21 >>  Dose adjustments this admission: N/A  Microbiology results: 11/21 Bcx: 11/21 MRSA PCR: 11/21 RVP:   Thank you for allowing pharmacy to be a part of this patient's care.  12/21, PharmD, [employer] PGY1 Pharmacy Resident 04/28/2022 1:33 PM

## 2022-04-28 NOTE — ED Notes (Signed)
Pt will be put on Bipap in about an hour as she just ate dinner for safety reasons.  MD Ophelia Charter notified and aware.  No new orders a this time.

## 2022-04-28 NOTE — Progress Notes (Signed)
@Patient  ID: , female    DOB: 19-Nov-1945, 76 y.o.   MRN: [id number]  Chief Complaint  Patient presents with   Follow-up    Pt f/u she is still feeling bad, O2 dropped this morning to 85% but came back up. Pt does state she feels like her breathing is worse.      Referring provider: [id number], MD  HPI: 76 year old female, former smoker followed for asthma/COPD, tracheobronchomalacia, chronic respiratory failure on supplemental O2, OSA on BiPAP. She is a patient of Dr. 61 and last seen in office on 04/21/2022 by Laporshia Hogen,NP. Past medical history significant for history of NSTEMI, hypertension, PAF on Xarelto, CHF, type 2 diabetes, HLD, obesity.  She was recently hospitalized from 03/17/2022 to 03/22/2022 for sepsis felt initially thought to be related to pneumonia.  She had a fevers up to 103 and a dry hacking cough.  CTA was negative for PE or acute airspace disease.  She was treated for AECOPD with steroids and IV antibiotics.  She was weaned to her baseline supplemental O2 and discharged on 03/24/2022.  She then contacted the office on 10/27 with recurrent fevers and increased cough.  She was advised to come in for an office visit.  She was also sent in Augmentin for 7 days.  She was seen 04/06/2022 by Dr. 04/08/2022 for acute visit.  She was advised to finish antibiotics and continue triple therapy regimen.  TEST/EVENTS:  03/18/2022 CTA chest: No evidence of PE.  Ectasia of main pulmonary artery, suggesting PAH.  Centrilobular emphysema.  Small linear densities in the lower lung fields, suggest scarring or atelectasis.  No focal pulmonary consolidation.  04/21/2022: OV with India Jolin NP for follow-up with her son.  Unfortunately, over the last 2 to 3 days she had recurrent fevers and has developed an increased productive cough with yellow sputum.  She feels very fatigued and rundown.  Breathing is more short than it normally is.  She also has chest congestion and wheezing.   She did have an episode of hemoptysis yesterday.  No further occurrences since.  She denies any calf pain or swelling.  She is compliant with her Xarelto.  Has not missed any doses.  She is eating and drinking well.  She is using Breo once a day and Yupelri once a day.  Uses her albuterol rescue inhaler twice daily.  Does not have any rescue medicine for nebulizer machine.  She completed previously prescribed Augmentin around 11/3.  She increased her supplemental oxygen at home to 5 L.  Previously on 4.  Not routinely checking her oxygen levels at home.CXR with worsening RLL pna. Unable to treat her with fluoroquinolones or macrolides due to use of Tikosyn. Started on 14 day course of cefpodoxime. Pred taper for AECOPD. Sputum cultures ordered. CBC with normalized hgb and without leukocytosis.   04/28/2022: Today - follow up Patient presents today for follow-up.  She was seen last week with recurrent fevers and productive cough.  CXR showed worsening right lower lobe opacities.  She had increased oxygen requirements up to 5 L, previously on 4 L.  She had just completed Augmentin around 11/3 for a 7-day course.  Unfortunately, unable to treat her with flora quinolones or macrolides due to her Tikosyn use.  She was started on cefpodoxime for a 14-day course.  She is currently on day 7.  She was also treated with prednisone taper due to AECOPD.  Today, she tells me that she  is not feeling any better.  She actually feels worse.  She feels significantly fatigued.  Feels like her breathing has declined.  Cough is less productive but still feels congested.  It is paroxysmal at times.  She has been struggling to drink fluids as it is hard to breathe and drink at the same time.  She has been able to eat okay but feels like her appetite is not what it normally is.  No recurrence of fevers.  She denies any hemoptysis, lower extremity swelling, orthopnea, palpitations.  She was not able to collect sputum cultures previously  ordered.  No increased oxygen demand.  She is still on the 5 L that she increased her self to last time.  She did note that her oxygen briefly dropped to 85% this morning but she quickly recovered back up into the 90s.  Allergies  Allergen Reactions   Allopurinol Other (See Comments)    Reaction:  Dizziness    Clindamycin Anaphylaxis and Hives   Flublok [Influenza Vaccine Recombinant] Other (See Comments)    Fever 103 with no alternative explanation day after vaccine.  Clydie Braun Highfill FNP-C   Pneumococcal 13-Val Conj Vacc Itching, Swelling and Rash   Dronedarone Rash   Brovana [Arformoterol]     Caused muscle pain   Budesonide     Caused extreme joint pain   Entresto [Sacubitril-Valsartan] Other (See Comments)    hypotension   Fosamax [Alendronate Sodium] Nausea Only   Jardiance [Empagliflozin]     Caused a vaginal infection   Meperidine Nausea And Vomiting   Microplegia Msa-Msg [Plegisol]     Loopy,diarrhea   Rosuvastatin Other (See Comments)    Reaction:  Muscle spasms    Tetracycline Hives   Adhesive [Tape] Rash   Lovastatin Rash    Other reaction(s): Muscle Pain    Immunization History  Administered Date(s) Administered   Fluad Quad(high Dose 65+) 04/22/2021, 03/16/2022   Hepatitis A, Adult 04/12/2001   Influenza, High Dose Seasonal PF 03/16/2019   PFIZER Comirnaty(Gray Top)Covid-19 Tri-Sucrose Vaccine 03/29/2020, 10/09/2020   PFIZER(Purple Top)SARS-COV-2 Vaccination 07/13/2019, 08/08/2019   Pneumococcal Conjugate-13 08/30/2014   Pneumococcal Polysaccharide-23 03/31/2019   Tdap 10/02/2013   Yellow Fever 04/12/2001    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HCC)    CHF (congestive heart failure) (HCC)    Chronic renal insufficiency    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    Concussion 10/04/2021   COPD (chronic obstructive pulmonary disease) (HCC)     Gout    Hypertension    Longstanding persistent atrial fibrillation (HCC)    Nonischemic cardiomyopathy (HCC)    Obesity    Pacemaker    Spontaneous pneumothorax 2013    Tobacco History: Social History   Tobacco Use  Smoking Status Former   Packs/day: 1.00   Years: 20.00   Total pack years: 20.00   Types: Cigarettes   Quit date: 06/09/1987   Years since quitting: 34.9   Passive exposure: Past  Smokeless Tobacco Never  Tobacco Comments   Former smoker 12/25/21   Counseling given: Not Answered Tobacco comments: Former smoker 12/25/21   Outpatient Medications Prior to Visit  Medication Sig Dispense Refill   acetaminophen (TYLENOL) 650 MG CR tablet Take 1,300 mg by mouth as needed for pain.     albuterol (PROVENTIL) (2.5 MG/3ML) 0.083% nebulizer solution Take 3 mLs (2.5 mg total) by  nebulization every 6 (six) hours as needed for wheezing or shortness of breath. 75 mL 5   albuterol (VENTOLIN HFA) 108 (90 Base) MCG/ACT inhaler Inhale 2 puffs into the lungs every 4 (four) hours as needed for wheezing or shortness of breath. 8 g 2   azelastine (ASTELIN) 0.1 % nasal spray Place 2 sprays into both nostrils 2 (two) times daily. 30 mL 5   Bempedoic Acid (NEXLETOL) 180 MG TABS Take 1 tablet by mouth daily. 90 tablet 0   cefpodoxime (VANTIN) 200 MG tablet Take 1 tablet (200 mg total) by mouth 2 (two) times daily for 14 days. 28 tablet 0   Cholecalciferol (VITAMIN D3) 50 MCG (2000 UT) TABS Take 2,000 Units by mouth at bedtime.     cyanocobalamin (,VITAMIN B-12,) 1000 MCG/ML injection Inject 1 mL (1,000 mcg total) into the muscle every 30 (thirty) days. 10 mL 0   docusate sodium (COLACE) 100 MG capsule Take 100 mg by mouth 2 (two) times daily.     dofetilide (TIKOSYN) 250 MCG capsule Take 1 capsule (250 mcg total) by mouth in the morning and at bedtime. 60 capsule 6   febuxostat (ULORIC) 40 MG tablet TAKE 1 TABLET(40 MG) BY MOUTH EVERY NIGHT (Patient taking differently: Take 40 mg by mouth  at bedtime.) 90 tablet 0   fluticasone furoate-vilanterol (BREO ELLIPTA) 100-25 MCG/ACT AEPB Inhale 1 puff into the lungs daily. 60 each 4   fluticasone furoate-vilanterol (BREO ELLIPTA) 200-25 MCG/ACT AEPB INHALE 1 PUFF INTO THE LUNGS DAILY 180 each 3   linaclotide (LINZESS) 145 MCG CAPS capsule Take 145 mcg by mouth daily as needed (constipation).     losartan (COZAAR) 25 MG tablet TAKE 1 TABLET(25 MG) BY MOUTH AT BEDTIME (Patient taking differently: Take 25 mg by mouth at bedtime.) 90 tablet 3   Magnesium 500 MG TABS Take 500 mg by mouth at bedtime.     mupirocin ointment (BACTROBAN) 2 % Apply 1 application. topically 2 (two) times daily. (Patient taking differently: Apply 1 application  topically 2 (two) times daily as needed (wound care).) 30 g 2   nystatin cream (MYCOSTATIN) Apply to affected area 2 times daily (Patient taking differently: Apply 1 Application topically 2 (two) times daily as needed for dry skin.) 90 g 1   OXYGEN Inhale 4.5 L into the lungs continuous. Use with resmed ventilator     predniSONE (DELTASONE) 10 MG tablet 4 tabs for 3 days, then 3 tabs for 3 days, 2 tabs for 3 days, then 1 tab for 3 days, then stop 30 tablet 0   rivaroxaban (XARELTO) 20 MG TABS tablet Take 1 tablet (20 mg total) by mouth daily with supper. 30 tablet 5   spironolactone (ALDACTONE) 25 MG tablet TAKE 1 TABLET(25 MG) BY MOUTH EVERY MORNING (Patient taking differently: Take 25 mg by mouth daily.) 90 tablet 3   torsemide (DEMADEX) 20 MG tablet Take  (1 Tab) Monday and Friday (Patient taking differently: Take 20 mg by mouth 2 (two) times a week. Take  (1 Tab) Monday and Friday) 60 tablet 3   traMADol (ULTRAM) 50 MG tablet Take 50 mg by mouth every 6 (six) hours as needed for pain.     YUPELRI 175 MCG/3ML nebulizer solution USE 1 VIAL IN NEBULIZER DAILY - DO NOT MIX WITH OTHER NEB MEDS,USE BEFORE OR AFTER (Patient taking differently: Take 175 mcg by nebulization daily.) 30 mL 11   No  facility-administered medications prior to visit.     Review of Systems:  Constitutional: No weight loss or gain, night sweats, fevers, chills. +fatigue, lassitude. HEENT: No headaches, difficulty swallowing, tooth/dental problems, or sore throat. No sneezing, itching, ear ache. +occasional nasal congestion, post nasal drip CV:  +chest discomfort. No orthopnea, PND, swelling in lower extremities, anasarca, dizziness, palpitations, syncope Resp: +shortness of breath with exertion; congested, paroxysmal cough; wheezing; chest congestion. No hemoptysis. No chest wall deformity GI:  +decreased appetite. No heartburn, indigestion, abdominal pain, nausea, vomiting, diarrhea, change in bowel habits, bloody stools.  GU: +decreased urine output yesterday, improved today. No dysuria, change in color of urine, urgency or frequency.  No flank pain, no hematuria  Skin: No rash, lesions, ulcerations MSK:  No joint pain or swelling.   Neuro: No dizziness or lightheadedness.  Psych: No depression or anxiety. Mood stable.     Physical Exam:  BP 132/86   Pulse 85   Ht 5\' 1"  (1.549 m)   Wt 183 lb 3.2 oz (83.1 kg)   SpO2 94% Comment: 5L POC  BMI 34.62 kg/m   GEN: Pleasant, interactive, acute on chronically-ill appearing; obese; in no acute distress HEENT:  Normocephalic and atraumatic. PERRLA. Sclera white. Nasal turbinates pink, moist and patent bilaterally. No rhinorrhea present. Oropharynx pink and moist, without exudate or edema. No lesions, ulcerations, or postnasal drip.  NECK:  Supple w/ fair ROM. No JVD present. Normal carotid impulses w/o bruits. Thyroid symmetrical with no goiter or nodules palpated. No lymphadenopathy.   CV: RRR, no m/r/g, no peripheral edema. Pulses intact, +2 bilaterally. No cyanosis, pallor or clubbing. PULMONARY:  Increased work of breathing; improved post neb. Scattered rhonchi bilaterally A&P. Congested cough. No accessory muscle use.  GI: BS present and normoactive.  Soft, non-tender to palpation. No organomegaly or masses detected.  MSK: No erythema, warmth or tenderness. Cap refil <2 sec all extrem. No deformities or joint swelling noted.  Neuro: A/Ox3. No focal deficits noted.   Skin: Warm, no lesions or rashe Psych: Normal affect and behavior. Judgement and thought content appropriate.     Lab Results:  CBC    Component Value Date/Time   WBC 7.0 04/21/2022 1312   RBC 4.80 04/21/2022 1312   HGB 13.1 04/21/2022 1312   HGB 13.4 12/28/2012 0748   HCT 39.6 04/21/2022 1312   HCT 39.2 12/28/2012 0748   PLT 201.0 04/21/2022 1312   PLT 186 12/28/2012 0748   MCV 82.4 04/21/2022 1312   MCV 87 12/28/2012 0748   MCH 26.8 03/22/2022 0039   MCHC 33.1 04/21/2022 1312   RDW 15.7 (H) 04/21/2022 1312   RDW 15.0 (H) 12/28/2012 0748   LYMPHSABS 0.6 (L) 04/21/2022 1312   LYMPHSABS 0.4 (L) 12/28/2012 0748   MONOABS 0.6 04/21/2022 1312   MONOABS 0.5 12/28/2012 0748   EOSABS 0.0 04/21/2022 1312   EOSABS 0.0 12/28/2012 0748   BASOSABS 0.0 04/21/2022 1312   BASOSABS 0.0 12/28/2012 0748    BMET    Component Value Date/Time   NA 135 04/21/2022 1312   NA 130 (L) 12/28/2012 0748   K 4.1 04/21/2022 1312   K 4.6 12/28/2012 0748   CL 98 04/21/2022 1312   CL 95 (L) 12/28/2012 0748   CO2 28 04/21/2022 1312   CO2 23 12/28/2012 0748   GLUCOSE 116 (H) 04/21/2022 1312   GLUCOSE 391 (H) 12/28/2012 0748   BUN 15 04/21/2022 1312   BUN 32 (H) 12/28/2012 0748   CREATININE 0.86 04/21/2022 1312   CREATININE 1.29 12/28/2012 0748   CALCIUM 9.6 04/21/2022 1312  CALCIUM 14.3 (HH) 06/26/2019 1618   GFRNONAA >60 03/22/2022 0039   GFRNONAA 43 (L) 12/28/2012 0748   GFRAA 58 (L) 06/28/2019 0427   GFRAA 50 (L) 12/28/2012 0748    BNP    Component Value Date/Time   BNP 380.3 (H) 03/22/2022 0041     Imaging:  DG Chest 2 View  Result Date: 04/28/2022 CLINICAL DATA:  RLL pna; worsening dyspnea/cough despite abx therapy EXAM: CHEST - 2 VIEW COMPARISON:   Radiograph 06/21/2021 FINDINGS: Unchanged cardiomediastinal silhouette and pacemaker leads. Unchanged CardioMEMS device. There is no focal airspace consolidation. Slight improvement in right lower lung airspace disease. Blunted costophrenic sulci suggesting trace effusions. No evidence of pneumothorax. No acute osseous abnormality. Thoracic spondylosis. IMPRESSION: Slight improvement right lower lung airspace disease. Blunted costophrenic sulci suggesting trace effusions. Electronically Signed   By: Caprice Renshaw M.D.   On: 04/28/2022 10:20   DG Chest 2 View  Result Date: 04/21/2022 CLINICAL DATA:  Productive cough EXAM: CHEST - 2 VIEW COMPARISON:  Chest radiograph 03/17/2022, CTA chest 03/18/2022 FINDINGS: The left chest wall cardiac device and associated leads are stable. The cardiomediastinal silhouette is stable with unchanged mild cardiomegaly. Lungs remain hyperinflated with flattening of the diaphragms consistent with underlying COPD. There are patchy opacities in the right lower lobe which are increased from the prior study. There is no other focal airspace disease. There is no pulmonary edema. There is no pleural effusion or pneumothorax There is no acute osseous abnormality. IMPRESSION: Patchy opacities in the right lower lobe suspicious for pneumonia superimposed on background COPD. Recommend follow-up radiographs in 3-4 weeks to ensure resolution. Electronically Signed   By: Lesia Hausen M.D.   On: 04/21/2022 12:20   CUP PACEART REMOTE DEVICE CHECK  Result Date: 04/02/2022 Scheduled remote reviewed. Normal device function.  Next remote 91 days. LA   albuterol (PROVENTIL) (2.5 MG/3ML) 0.083% nebulizer solution 2.5 mg     Date Action Dose Route User   04/28/2022 1015 Given 2.5 mg Nebulization Morrie Sheldon E, CMA      methylPREDNISolone acetate (DEPO-MEDROL) injection 80 mg     Date Action Dose Route User   04/21/2022 1230 Given 80 mg Intramuscular (Left Upper Outer Quadrant) Charlott Holler, RN           No data to display          No results found for: "NITRICOXIDE"      Assessment & Plan:   CAP (community acquired pneumonia) Hospitalized 03/17/2022 to 03/22/2022 for sepsis felt to be related to pneumonia.  She was treated with IV antibiotics and steroids for AECOPD.  She was discharged after clinically improving.  Had worsening symptoms and was started on Augmentin 7-day course on 10/27, which she completed around 11/3.  Unfortunately, she had recurrent fevers, developed a more productive cough and had worsening fatigue.  She was seen 11/14 and started on 14-day course of cefpodoxime due to worsening right lower lobe pneumonia on imaging.  She is also started on prednisone taper for unresolved AECOPD.  Labs were stable at the time. Today, she has clinically worsened.  CXR did show some slight improvement in the right lower lobe opacities; however, there are new trace bilateral pleural effusions, likely parapneumonic.  Still has a week left cefpodoxime.  She was treated with albuterol neb and Depo injection.  Minimal improvement after neb.  At this point, she has failed outpatient treatment.  Limited on antibiotics given her cardiac medications.  She was advised that she  will likely need hospital admission for further treatment.  EMS transport declined by patient.  Vital signs were stable and she was able to maintain saturations on 5 L continuous O2 so son will take her via private vehicle.  COPD with acute exacerbation (HCC) Unresolved/worsening AECOPD. See above plan.   Chronic respiratory failure with hypoxia (HCC) Increased O2 requirement from baseline; 4 >>5 lpm. Able to maintain sats >88% on 5 lpm continuous in office today. See above plan.   I spent 45 minutes of dedicated to the care of this patient on the date of this encounter to include pre-visit review of records, face-to-face time with the patient discussing conditions above, post visit ordering  of testing, clinical documentation with the electronic health record, making appropriate referrals as documented, and communicating necessary findings to members of the patients care team.  Noemi Chapel, NP 04/28/2022  Pt aware and understands NP's role.

## 2022-04-28 NOTE — Assessment & Plan Note (Signed)
Increased O2 requirement from baseline; 4 >>5 lpm. Able to maintain sats >88% on 5 lpm continuous in office today. See above plan.

## 2022-04-28 NOTE — Progress Notes (Addendum)
RT went to place patient on BiPAP at this time, patient had just got done eating and still drinking therefore patient will have to wait to be placed on BiPAP at this time. RT made RN and MD made aware. RT will continue to monitor.

## 2022-04-28 NOTE — Patient Instructions (Signed)
Go to the emergency department due to your worsening symptoms   Follow up once discharged with Dr. Vassie Loll or Philis Nettle

## 2022-04-29 ENCOUNTER — Other Ambulatory Visit: Payer: Self-pay

## 2022-04-29 DIAGNOSIS — J398 Other specified diseases of upper respiratory tract: Secondary | ICD-10-CM

## 2022-04-29 DIAGNOSIS — J181 Lobar pneumonia, unspecified organism: Secondary | ICD-10-CM | POA: Diagnosis not present

## 2022-04-29 DIAGNOSIS — J189 Pneumonia, unspecified organism: Secondary | ICD-10-CM | POA: Diagnosis present

## 2022-04-29 DIAGNOSIS — G4733 Obstructive sleep apnea (adult) (pediatric): Secondary | ICD-10-CM | POA: Diagnosis not present

## 2022-04-29 LAB — RESPIRATORY PANEL BY PCR

## 2022-04-29 LAB — CBC
HCT: 39 % (ref 36.0–46.0)
Hemoglobin: 12.1 g/dL (ref 12.0–15.0)
MCH: 26.7 pg (ref 26.0–34.0)
MCHC: 31 g/dL (ref 30.0–36.0)
MCV: 85.9 fL (ref 80.0–100.0)
Platelets: 281 10*3/uL (ref 150–400)
RBC: 4.54 MIL/uL (ref 3.87–5.11)
RDW: 14.3 % (ref 11.5–15.5)
WBC: 16.5 10*3/uL — ABNORMAL HIGH (ref 4.0–10.5)
nRBC: 0 % (ref 0.0–0.2)

## 2022-04-29 LAB — GLUCOSE, CAPILLARY
Glucose-Capillary: 185 mg/dL — ABNORMAL HIGH (ref 70–99)
Glucose-Capillary: 204 mg/dL — ABNORMAL HIGH (ref 70–99)

## 2022-04-29 LAB — BASIC METABOLIC PANEL
Anion gap: 13 (ref 5–15)
BUN: 19 mg/dL (ref 8–23)
CO2: 25 mmol/L (ref 22–32)
Calcium: 9.7 mg/dL (ref 8.9–10.3)
Chloride: 100 mmol/L (ref 98–111)
Creatinine, Ser: 0.88 mg/dL (ref 0.44–1.00)
GFR, Estimated: >60 mL/min (ref >60)
Glucose, Bld: 132 mg/dL — ABNORMAL HIGH (ref 70–99)
Potassium: 4.3 mmol/L (ref 3.5–5.1)
Sodium: 138 mmol/L (ref 135–145)

## 2022-04-29 LAB — MAGNESIUM: Magnesium: 2.2 mg/dL (ref 1.7–2.4)

## 2022-04-29 LAB — PROCALCITONIN: Procalcitonin: <0.1 ng/mL

## 2022-04-29 MED ORDER — HYDROCODONE BIT-HOMATROP MBR 5-1.5 MG/5ML PO SOLN
5.0000 mL | Freq: Four times a day (QID) | ORAL | Status: DC | PRN
  Administered 2022-04-30 – 2022-05-01 (×4): 5 mL via ORAL
  Filled 2022-04-29 (×5): qty 5

## 2022-04-29 MED ORDER — FLUTICASONE PROPIONATE 50 MCG/ACT NA SUSP
2.0000 | Freq: Every day | NASAL | Status: DC
  Administered 2022-04-29 – 2022-05-04 (×6): 2 via NASAL
  Filled 2022-04-29: qty 16

## 2022-04-29 MED ORDER — INSULIN ASPART 100 UNIT/ML IJ SOLN
0.0000 [IU] | Freq: Three times a day (TID) | INTRAMUSCULAR | Status: DC
  Administered 2022-04-29: 2 [IU] via SUBCUTANEOUS
  Administered 2022-04-30 (×2): 1 [IU] via SUBCUTANEOUS
  Administered 2022-04-30: 3 [IU] via SUBCUTANEOUS
  Administered 2022-05-01: 2 [IU] via SUBCUTANEOUS
  Administered 2022-05-01: 1 [IU] via SUBCUTANEOUS
  Administered 2022-05-02 (×2): 2 [IU] via SUBCUTANEOUS
  Administered 2022-05-02: 5 [IU] via SUBCUTANEOUS
  Administered 2022-05-03 (×2): 2 [IU] via SUBCUTANEOUS
  Administered 2022-05-03: 5 [IU] via SUBCUTANEOUS
  Administered 2022-05-04 (×2): 2 [IU] via SUBCUTANEOUS

## 2022-04-29 MED ORDER — PREDNISONE 5 MG PO TABS
10.0000 mg | ORAL_TABLET | Freq: Every day | ORAL | Status: DC
  Administered 2022-04-29 – 2022-04-30 (×2): 10 mg via ORAL
  Filled 2022-04-29 (×2): qty 2

## 2022-04-29 MED ORDER — ACETAMINOPHEN 325 MG PO TABS
650.0000 mg | ORAL_TABLET | Freq: Four times a day (QID) | ORAL | Status: DC | PRN
  Filled 2022-04-29: qty 2

## 2022-04-29 NOTE — Consult Note (Signed)
NAME:  Kathleen [name], MRN:  [id number], DOB:  [birth year], LOS: 1 ADMISSION DATE:  04/28/2022, CONSULTATION DATE: 11/21 REFERRING MD: Dr. Lorin Mercy, CHIEF COMPLAINT: Pneumonia  History of Present Illness:  76 year old female with past medical history as below, which is significant for COPD, BOOP, on 4 L home O2, tracheobronchomalacia, OSA noncompliant with home BiPAP, paroxysmal atrial fibrillation on Xarelto and former smoker.  She is a patient of Dr. Elsworth Soho in the pulmonary clinic.  Her tracheobronchomalacia has been worked up at [facility] and she was offered a stent trial followed by tracheal bronchoplasty but was considered high risk and she did decided to decline.  She has been noncompliant with NIMV since that time as well.  COPD controller medications include Maurine Minister, and albuterol.  She was recently admitted to [facility] from 10/10 to 10/15 and was treated for CAP, and COPD exacerbation with improvement in her symptoms. Symptoms quickly returned after discharge to home. She presented to the pulmonary clinic 11/14 with complaints of recurrent fevers and productive cough. And was again treated with antibiotics and steroids. Returned to clinic 11/21 with worsening symptoms and was sent to the emergency department.   Upon arrival to the ED she was complaining of dyspnea, fatigue, and right sided chest pain. She was requiring 6L [place] to maintain sats in the 90s. Cough is non-productive currently and comes and goes at times. She was admitted to the hospitalist service for treatment of PNA and COPD exacerbation. PCCM has been asked to evaluate.   Pertinent  Medical History   has a past medical history of Asthma, 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[place]), CHF (congestive heart failure) ([place]), Chronic renal insufficiency, Complete heart block ([place]) s/p AV nodal ablation, Concussion (10/04/2021), COPD (chronic obstructive pulmonary disease) ([place]), Gout, Hypertension, Longstanding  persistent atrial fibrillation ([place]), Nonischemic cardiomyopathy ([place]), Obesity, Pacemaker, and Spontaneous pneumothorax (2013).   Significant Hospital Events: Including procedures, antibiotic start and stop dates in addition to other pertinent events   11/21 admitted  Interim History / Subjective:  Overnight slept poorly-- only slept about an hour. Coughing is her most bothersome symptom.  Objective   Blood pressure (!) 144/71, pulse 77, temperature 97.9 F (36.6 C), temperature source Oral, resp. rate (!) 21, height 5\' 1"  (1.549 m), weight 83 kg, SpO2 97 %.    FiO2 (%):  [30 %] 30 %   Intake/Output Summary (Last 24 hours) at 04/29/2022 0707 Last data filed at 04/29/2022 0142 Gross per 24 hour  Intake 100 ml  Output --  Net 100 ml   Filed Weights   04/28/22 1132  Weight: 83 kg    Examination: General: chronically ill appearing woman sitting up in bed in NAD HENT: [place]/AT, eyes anicteric Lungs: Mild bilateral wheezing, frequent coughing. No conversational dyspnea.  Cardiovascular: S1S2, RRR Abdomen: soft, NT Extremities: no cyanosis Neuro: awake, alert, moving all extremities. Answering questions appropriately.  CT sinus: diffuse sinus disease Procalcitonin pending WBC 16.5  Resolved Hospital Problem list     Assessment & Plan:   Chronic hypoxic respiratory failure- she has extensive pulmonary history including COPD, BOOP, Tracheobronchomalacia, and OSA. She has had difficulty with compliance with NIMV. She has failed multiple courses of antibiotics and steroids as an outpatient. Imaging showed improvement, but leukocytosis is worsening and clinically she is not resolved.  -RVP pending> still not collected -respiratory cultures- routine, AFB, fungal -con't routine antibiotics -adding prednisone, hycodan cough syrup -Con't PTA azelastine, adding flonase. Needs to see ENT as an outpatient (  previously referred) -con't CPT, hypertonic saline, frequent BD -breo, yupelri   -con't supplemental O2; titrate as needed to maintain SpO2 >88%  Best Practice (right click and "Reselect all SmartList Selections" daily)   Per primary  Labs   CBC: Recent Labs  Lab 04/28/22 1200  WBC 13.7*  NEUTROABS 11.8*  HGB 13.4  HCT 41.3  MCV 83.6  PLT 281     Basic Metabolic Panel: Recent Labs  Lab 04/28/22 1200  NA 139  K 4.0  CL 98  CO2 27  GLUCOSE 142*  BUN 26*  CREATININE 1.01*  CALCIUM 10.2      Steffanie Dunn, DO 04/29/22 7:53 AM [facility]  For contact information, see Amion. If no response to pager, please call PCCM consult pager. After hours, 7PM- 7AM, please call Elink.

## 2022-04-29 NOTE — Progress Notes (Signed)
SHALAH MARIAN  [id number] DOB: 01-08-1946 DOA: 04/28/2022 PCP: [name], MD    Brief Narrative:  76 year old with a history of atrial fibrillation, BOOP, tracheobronchomalacia, COPD, OSA on BiPAP, pacemaker, and chronic CHF who presented to the ER with shortness of breath.  She was seen by her Pulmonologist 11/14 and again 11/21 AM and despite antibiotic therapy her symptoms progressed.  Consultants:  None  Goals of Care:  Code Status: Full Code   DVT prophylaxis: Xarelto  Interim Hx: Afebrile.  Vital signs stable.  Saturating 100% on 5 L nasal cannula.  Reports that she is still not back at her baseline and remains short of breath.  Denies chest pain nausea vomiting or abdominal pain.  Assessment & Plan:  Nonresolving RML pneumonia - BOOP COVID and influenza negative -more extensive respiratory cultures and RVP pending -continue routine antibiotics for now -prednisone added by pulmonary -continue COPD and hypertonic saline  Chronic sinusitis Sinus CT noted diffuse paranasal sinus disease -needs to follow-up with ENT as an outpatient  Tracheobronchomalacia - OSA Continue nightly BiPAP -poorly compliant with BiPAP at home  COPD with acute exacerbation Requires 4L [place] support at baseline at home -wean back towards baseline as able  Chronic combined systolic and diastolic CHF without acute exacerbation Currently well compensated -monitor weights and ins and outs  HTN Blood pressure reasonably controlled -follow without change today  Chronic atrial fibrillation Continue Tikosyn and Xarelto -heart rate controlled at present  DM Diet controlled/does not require home medication -monitor CBG while on steroid  CKD stage IIIa Baseline creatinine appears to be approximately 0.8 -creatinine ever so slightly elevated at admission at 1.0 -follow trend   Family Communication: No family present Disposition: From home -anticipate eventual discharge  home   Objective: Blood pressure (!) 144/71, pulse 77, temperature 97.9 F (36.6 C), temperature source Oral, resp. rate (!) 21, height 5\' 1"  (1.549 m), weight 83 kg, SpO2 97 %.  Intake/Output Summary (Last 24 hours) at 04/29/2022 0730 Last data filed at 04/29/2022 0142 Gross per 24 hour  Intake 100 ml  Output --  Net 100 ml   Filed Weights   04/28/22 1132  Weight: 83 kg    Examination: General: No acute respiratory distress Lungs: Poor air movement bilaterally with scattered crackles and mild expiratory wheezing Cardiovascular: Regular rate without murmur gallop or rub normal S1 and S2 Abdomen: Nontender, nondistended, soft, bowel sounds positive, no rebound, no ascites, no appreciable mass Extremities: No significant cyanosis, clubbing, or edema bilateral lower extremities  CBC: Recent Labs  Lab 04/28/22 1200 04/29/22 0500  WBC 13.7* 16.5*  NEUTROABS 11.8*  --   HGB 13.4 12.1  HCT 41.3 39.0  MCV 83.6 85.9  PLT 281 [id number]   Basic Metabolic Panel: Recent Labs  Lab 04/28/22 1200  NA 139  K 4.0  CL 98  CO2 27  GLUCOSE 142*  BUN 26*  CREATININE 1.01*  CALCIUM 10.2   GFR: Estimated Creatinine Clearance: 47 mL/min (A) (by C-G formula based on SCr of 1.01 mg/dL (H)).   Scheduled Meds:  azelastine  2 spray Each Nare BID   docusate sodium  100 mg Oral BID   dofetilide  250 mcg Oral BID   febuxostat  40 mg Oral QHS   fluticasone  2 spray Each Nare Daily   fluticasone furoate-vilanterol  1 puff Inhalation Daily   ipratropium-albuterol  3 mL Nebulization Q6H   methylPREDNISolone (SOLU-MEDROL) injection  125 mg Intravenous Daily   predniSONE  10 mg Oral Q breakfast   revefenacin  175 mcg Nebulization Daily   rivaroxaban  20 mg Oral Q supper   sodium chloride flush  3 mL Intravenous Q12H   sodium chloride HYPERTONIC  4 mL Nebulization TID   Continuous Infusions:  ceFEPime (MAXIPIME) IV Stopped (04/29/22 0142)   vancomycin       LOS: 1 day   Lonia Blood, MD [facility] Office  [phone] Pager - Text Page per Amion  If 7PM-7AM, please contact night-coverage per Amion 04/29/2022, 7:30 AM

## 2022-04-29 NOTE — Progress Notes (Signed)
PT Cancellation Note  Patient Details Name: Kathleen [name] MRN: [id number] DOB: 1946/05/01   Cancelled Treatment:    Reason Eval/Treat Not Completed: Patient at procedure or test/unavailable. Pt OTF. Will plan to follow-up later as time permits.   Raymond Gurney, PT, DPT Acute Rehabilitation Services  Office: [phone]    Kathleen [name] 04/29/2022, 2:14 PM

## 2022-04-29 NOTE — Evaluation (Signed)
Occupational Therapy Evaluation Patient Details Name: Kathleen [name] MRN: [id number] DOB: [birth year] Today's Date: 04/29/2022   History of Present Illness 76 y.o. female with medical history significant of afib, BOOP, chronic combined CHF, tracheobronchomalacia, COPD, HTN, OSA on BIPAP, and pacemaker placement presenting with SOB with cough.   Clinical Impression   Patient admitted for the diagnosis above.  PTA she resides at a local ILF.  She typically walks the facility with a 4WRW, has someone to assist with home management, but otherwise in Ind with ADL, bill payment, meds, and light meal prep. Deficits is SOB, increased O2 need, and poor activity tolerance.  OT will continue efforts in the acute setting, and no post acute OT is anticipated.        Recommendations for follow up therapy are one component of a multi-disciplinary discharge planning process, led by the attending physician.  Recommendations may be updated based on patient status, additional functional criteria and insurance authorization.   Follow Up Recommendations  No OT follow up     Assistance Recommended at Discharge Intermittent Supervision/Assistance  Patient can return home with the following Assist for transportation;Assistance with cooking/housework    Functional Status Assessment  Patient has had a recent decline in their functional status and demonstrates the ability to make significant improvements in function in a reasonable and predictable amount of time.  Equipment Recommendations  None recommended by OT    Recommendations for Other Services       Precautions / Restrictions Precautions Precautions: Fall Precaution Comments: Increasing O2 need.  Watch HR Restrictions Weight Bearing Restrictions: No      Mobility Bed Mobility Overal bed mobility: Modified Independent               Patient Response: Cooperative  Transfers Overall transfer level: Needs assistance Equipment used:  Rolling walker (2 wheels) Transfers: Sit to/from Stand, Bed to chair/wheelchair/BSC Sit to Stand: Supervision     Step pivot transfers: Min guard     General transfer comment: closer to supervision with the RW for in the room mobility/toileting.      Balance Overall balance assessment: Needs assistance Sitting-balance support: Feet supported Sitting balance-Leahy Scale: Good     Standing balance support: Reliant on assistive device for balance Standing balance-Leahy Scale: Fair Standing balance comment: able to static stand for grooming                           ADL either performed or assessed with clinical judgement   ADL                                         General ADL Comments: generalized supervision due to line mangement.  Initial Min Guard without the RW     Vision Patient Visual Report: No change from baseline       Perception     Praxis      Pertinent Vitals/Pain Pain Assessment Pain Assessment: No/denies pain     Hand Dominance Right   Extremity/Trunk Assessment Upper Extremity Assessment Upper Extremity Assessment: Overall WFL for tasks assessed   Lower Extremity Assessment Lower Extremity Assessment: Defer to PT evaluation   Cervical / Trunk Assessment Cervical / Trunk Assessment: Normal   Communication Communication Communication: No difficulties   Cognition Arousal/Alertness: Awake/alert Behavior During Therapy: WFL for tasks assessed/performed Overall Cognitive Status: Within Functional Limits  for tasks assessed                                       General Comments   HR to mid 80's with light mobility.      Exercises     Shoulder Instructions      Home Living Family/patient expects to be discharged to:: Private residence Living Arrangements: Alone Available Help at Discharge: Family;Available PRN/intermittently Type of Home: Independent living facility Home Access: Elevator      Home Layout: One level     Bathroom Shower/Tub: Producer, television/film/video: Handicapped height Bathroom Accessibility: Yes How Accessible: Accessible via walker Home Equipment: Rollator (4 wheels);Cane - single point;Shower seat;Grab bars - tub/shower   Additional Comments: [facility].      Prior Functioning/Environment Prior Level of Function : Independent/Modified Independent             Mobility Comments: occasional cane at store until able to get a basket, uses rollator around facility. ADLs Comments: Ind with ADL, bill payment, meds, and meal prep.  Assist with home management.        OT Problem List: Decreased activity tolerance;Impaired balance (sitting and/or standing);Decreased strength      OT Treatment/Interventions: Self-care/ADL training;Patient/family education;Balance training;Therapeutic activities;DME and/or AE instruction    OT Goals(Current goals can be found in the care plan section) Acute Rehab OT Goals Patient Stated Goal: Hoping to reutn to her ILF OT Goal Formulation: With patient Time For Goal Achievement: 05/13/22 Potential to Achieve Goals: Good ADL Goals Pt Will Perform Grooming: with modified independence;standing Pt Will Perform Lower Body Dressing: with modified independence;sit to/from stand Pt Will Transfer to Toilet: with modified independence;ambulating;regular height toilet  OT Frequency: Min 2X/week    Co-evaluation              AM-PAC OT "6 Clicks" Daily Activity     Outcome Measure Help from another person eating meals?: None Help from another person taking care of personal grooming?: A Little Help from another person toileting, which includes using toliet, bedpan, or urinal?: A Little Help from another person bathing (including washing, rinsing, drying)?: A Little Help from another person to put on and taking off regular upper body clothing?: None Help from another person to put on and taking off regular lower  body clothing?: A Little 6 Click Score: 20   End of Session Equipment Utilized During Treatment: Rolling walker (2 wheels);Oxygen Nurse Communication: Mobility status  Activity Tolerance: Patient tolerated treatment well Patient left:    OT Visit Diagnosis: Unsteadiness on feet (R26.81);History of falling (Z91.81);Muscle weakness (generalized) (M62.81)                Time: 5361-4431 OT Time Calculation (min): 20 min Charges:  OT General Charges $OT Visit: 1 Visit OT Evaluation $OT Eval Moderate Complexity: 1 Mod  04/29/2022  RP, OTR/L  Acute Rehabilitation Services  Office:  [phone]   Suzanna Obey 04/29/2022, 3:45 PM

## 2022-04-29 NOTE — Evaluation (Signed)
Physical Therapy Evaluation Patient Details Name: Kathleen [name] MRN: [id number] DOB: Sep 01, 1945 Today's Date: 04/29/2022  History of Present Illness  76 y.o. female with presenting 04/28/22 with SOB and cough. She was seen by her Pulmonologist 11/14 and again 11/21 AM and despite antibiotic therapy her symptoms progressed. Pt with nonresolving RML pneumonia - BOOP. PMH: afib, BOOP, chronic combined CHF, tracheobronchomalacia, COPD, HTN, OSA on BIPAP, and pacemaker placement   Clinical Impression  Pt presents with condition above and deficits mentioned below, see PT Problem List. PTA, she was mod I using a rollator in her ILF but no AD in her home. She likely does some furniture surfing in her home though. She lives alone in [place] [facility] with elevator access to her home. Currently, pt demonstrates deficits in activity tolerance, aerobic endurance, and balance. She ambulates slowly using a RW at a min guard assist level with no LOB. She does appear to be at risk for falls, evident by her very slow gait speed. Her SpO2 did drop to 89% on 4-5L O2 via [place], but recovered quickly with seated rest break. Recommend pt resume her HHPT upon d/c. Will continue to follow acutely.     Recommendations for follow up therapy are one component of a multi-disciplinary discharge planning process, led by the attending physician.  Recommendations may be updated based on patient status, additional functional criteria and insurance authorization.  Follow Up Recommendations Home health PT (resume)      Assistance Recommended at Discharge PRN  Patient can return home with the following  Assistance with cooking/housework;Assist for transportation    Equipment Recommendations None recommended by PT  Recommendations for Other Services       Functional Status Assessment Patient has had a recent decline in their functional status and demonstrates the ability to make significant improvements in function in a reasonable  and predictable amount of time.     Precautions / Restrictions Precautions Precautions: Fall Precaution Comments: Increasing O2 need (4-5L baseline).  Watch HR Restrictions Weight Bearing Restrictions: No      Mobility  Bed Mobility               General bed mobility comments: Pt standing with OT upon arrival. Sitting EOB end of session.    Transfers                   General transfer comment: Pt standing with OT upon arrival.    Ambulation/Gait Ambulation/Gait assistance: Min guard Gait Distance (Feet): 95 Feet Assistive device: Rolling walker (2 wheels) Gait Pattern/deviations: Step-through pattern, Decreased stride length Gait velocity: reduced Gait velocity interpretation: <1.31 ft/sec, indicative of household ambulator   General Gait Details: Pt with slow, but mostly steady gait using RW. She fatigues with SpO2 dropping to 89% on 4-6L O2 via [place]  Stairs            Wheelchair Mobility    Modified Rankin (Stroke Patients Only)       Balance Overall balance assessment: Needs assistance Sitting-balance support: Feet supported Sitting balance-Leahy Scale: Good     Standing balance support: No upper extremity supported, Bilateral upper extremity supported, During functional activity Standing balance-Leahy Scale: Fair Standing balance comment: Able to stand to perform ADLs at sink without UE support, benefits from RW for gait > a few ft                             Pertinent Vitals/Pain Pain  Assessment Pain Assessment: No/denies pain    Home Living Family/patient expects to be discharged to:: Private residence Living Arrangements: Alone Available Help at Discharge: Family;Available PRN/intermittently Type of Home: Independent living facility Home Access: Elevator       Home Layout: One level Home Equipment: Rollator (4 wheels);Cane - single point;Shower seat;Grab bars - tub/shower Additional Comments: [facility].     Prior Function Prior Level of Function : Independent/Modified Independent             Mobility Comments: occasional cane at store until able to get a basket, uses rollator around facility. No AD, likely furniture surfs, within the home. ADLs Comments: Ind with ADL, bill payment, meds, and meal prep.  Assist with home management.     Hand Dominance   Dominant Hand: Right    Extremity/Trunk Assessment   Upper Extremity Assessment Upper Extremity Assessment: Defer to OT evaluation    Lower Extremity Assessment Lower Extremity Assessment: Overall WFL for tasks assessed    Cervical / Trunk Assessment Cervical / Trunk Assessment: Normal  Communication   Communication: No difficulties  Cognition Arousal/Alertness: Awake/alert Behavior During Therapy: WFL for tasks assessed/performed Overall Cognitive Status: Within Functional Limits for tasks assessed                                          General Comments General comments (skin integrity, edema, etc.): SpO2 down to 89% on 4-6L O2 with gait, recovers with seated rest break    Exercises     Assessment/Plan    PT Assessment Patient needs continued PT services  PT Problem List Decreased activity tolerance;Decreased balance;Decreased mobility;Cardiopulmonary status limiting activity       PT Treatment Interventions DME instruction;Gait training;Functional mobility training;Therapeutic activities;Therapeutic exercise;Balance training;Neuromuscular re-education;Patient/family education    PT Goals (Current goals can be found in the Care Plan section)  Acute Rehab PT Goals Patient Stated Goal: to get better PT Goal Formulation: With patient Time For Goal Achievement: 05/13/22 Potential to Achieve Goals: Good    Frequency Min 3X/week     Co-evaluation               AM-PAC PT "6 Clicks" Mobility  Outcome Measure Help needed turning from your back to your side while in a flat bed without  using bedrails?: None Help needed moving from lying on your back to sitting on the side of a flat bed without using bedrails?: None Help needed moving to and from a bed to a chair (including a wheelchair)?: A Little Help needed standing up from a chair using your arms (e.g., wheelchair or bedside chair)?: A Little Help needed to walk in hospital room?: A Little Help needed climbing 3-5 steps with a railing? : A Little 6 Click Score: 20    End of Session Equipment Utilized During Treatment: Oxygen Activity Tolerance: Patient tolerated treatment well Patient left: in bed;with call bell/phone within reach;with bed alarm set Nurse Communication: Other (comment) (pt requesting CPAP machine) PT Visit Diagnosis: Unsteadiness on feet (R26.81);Other abnormalities of gait and mobility (R26.89)    Time: 1610-9604 PT Time Calculation (min) (ACUTE ONLY): 25 min   Charges:   PT Evaluation $PT Eval Moderate Complexity: 1 Mod PT Treatments $Gait Training: 8-22 mins        Raymond Gurney, PT, DPT Acute Rehabilitation Services  Office: [phone]   Kathleen [name] 04/29/2022, 4:06 PM

## 2022-04-29 NOTE — Progress Notes (Signed)
RT NOTE: RT administered hypertonic nebulizer. Patient coughing uncontrollably to the point she is retching and vomiting. Patient just ate breakfast. CPT not performed at this time. RT gave patient flutter valve and educated on use. Patient will use later when she is not coughing to the point of vomiting.

## 2022-04-29 NOTE — ED Notes (Signed)
ED TO INPATIENT HANDOFF REPORT  ED Nurse Name and Phone #: Brien Mates [phone]  S Name/Age/Gender Kathleen [name] 76 y.o. female Room/Bed: 018C/018C  Code Status   Code Status: Full Code  Home: [facility]  Patient oriented to: self, place, time, and situation Is this baseline? Yes   Triage Complete: Triage complete  Chief Complaint CAP (community acquired pneumonia) [J18.9] Pneumonia [J18.9]  Triage Note Pt sent from PCP for possible PNA. Pt wears 5L O2 at baseline. Pt recently on abx for PNA. Endorses right shoulder blade pain, SOB and unproductive cough.    Allergies Allergies  Allergen Reactions   Allopurinol Other (See Comments)    Reaction:  Dizziness    Clindamycin Anaphylaxis and Hives   Flublok [Influenza Vaccine Recombinant] Other (See Comments)    Fever 103 with no alternative explanation day after vaccine.  Santiago Glad Highfill FNP-C   Pneumococcal 13-Val Conj Vacc Itching, Swelling and Rash   Dronedarone Rash   Brovana [Arformoterol]     Caused muscle pain   Budesonide     Caused extreme joint pain   Entresto [Sacubitril-Valsartan] Other (See Comments)    hypotension   Fosamax [Alendronate Sodium] Nausea Only   Jardiance [Empagliflozin]     Caused a vaginal infection   Meperidine Nausea And Vomiting   Microplegia Msa-Msg [Plegisol]     Loopy,diarrhea   Rosuvastatin Other (See Comments)    Reaction:  Muscle spasms    Tetracycline Hives   Adhesive [Tape] Rash   Lovastatin Rash and Other (See Comments)    Muscle Pain    Level of Care/Admitting Diagnosis ED Disposition     ED Disposition  Admit   Condition  --   [facility]: [facility] [[id number]]  Level of Care: Telemetry Medical [104]  May admit patient to [facility] or [facility] if equivalent level of care is available:: Yes  Covid Evaluation: Confirmed COVID Negative  Diagnosis: Pneumonia [id number]  Admitting Physician: Thereasa Solo, JEFFREY T  [[id number]]  Attending Physician: Thereasa Solo, JEFFREY T [id number]  Certification:: I certify this patient will need inpatient services for at least 2 midnights          B Medical/Surgery History Past Medical History:  Diagnosis Date   Asthma    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[facility])    CHF (congestive heart failure) ([place])    Chronic renal insufficiency    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    Concussion 10/04/2021   COPD (chronic obstructive pulmonary disease) (HCC)    Gout    Hypertension    Longstanding persistent atrial fibrillation (HCC)    on Xarelto   Nonischemic cardiomyopathy ([place])    Obesity    Pacemaker    Spontaneous pneumothorax 2013   Past Surgical History:  Procedure Laterality Date   ABDOMINAL HYSTERECTOMY     APPENDECTOMY     ATRIAL FIBRILLATION ABLATION  07/20/2013   by Dr Westley Gambles   AV nodal ablation  11/01/2013   by Dr Westley Gambles, repeated by Dr Dwana Curd   BREAST BIOPSY Bilateral 1997   negative   CARDIAC CATHETERIZATION     CHOLECYSTECTOMY     HERNIA REPAIR     PACEMAKER INSERTION  06/2017   MDT Viva CRT-P implanted by Dr Westley Gambles after AV nodal ablation,  LV lead could not be placed   PACEMAKER INSERTION  05/2020   with lead bundle   RIGHT/LEFT HEART CATH AND CORONARY ANGIOGRAPHY N/A 03/20/2022  Procedure: RIGHT/LEFT HEART CATH AND CORONARY ANGIOGRAPHY;  Surgeon: Martinique, Peter M, MD;  Location: [facility];  Service: Cardiovascular;  Laterality: N/A;     A IV Location/Drains/Wounds Patient Lines/Drains/Airways Status     Active Line/Drains/Airways     Name Placement date Placement time Site Days   Peripheral IV 04/28/22 20 G Left Antecubital 04/28/22  1312  Antecubital  1            Intake/Output Last 24 hours  Intake/Output Summary (Last 24 hours) at 04/29/2022 1300 Last data filed at 04/29/2022 0142 Gross per 24 hour  Intake 100 ml  Output --  Net 100 ml    Labs/Imaging Results for orders  placed or performed during the hospital encounter of 04/28/22 (from the past 48 hour(s))  CBC with Differential     Status: Abnormal   Collection Time: 04/28/22 12:00 PM  Result Value Ref Range   WBC 13.7 (H) 4.0 - 10.5 K/uL   RBC 4.94 3.87 - 5.11 MIL/uL   Hemoglobin 13.4 12.0 - 15.0 g/dL   HCT 41.3 36.0 - 46.0 %   MCV 83.6 80.0 - 100.0 fL   MCH 27.1 26.0 - 34.0 pg   MCHC 32.4 30.0 - 36.0 g/dL   RDW 14.0 11.5 - 15.5 %   Platelets 281 150 - 400 K/uL   nRBC 0.0 0.0 - 0.2 %   Neutrophils Relative % 87 %   Neutro Abs 11.8 (H) 1.7 - 7.7 K/uL   Lymphocytes Relative 8 %   Lymphs Abs 1.1 0.7 - 4.0 K/uL   Monocytes Relative 3 %   Monocytes Absolute 0.4 0.1 - 1.0 K/uL   Eosinophils Relative 0 %   Eosinophils Absolute 0.0 0.0 - 0.5 K/uL   Basophils Relative 0 %   Basophils Absolute 0.1 0.0 - 0.1 K/uL   Immature Granulocytes 2 %   Abs Immature Granulocytes 0.31 (H) 0.00 - 0.07 K/uL    Comment: Performed at [facility], [address] [place], [place] [address]  Comprehensive metabolic panel     Status: Abnormal   Collection Time: 04/28/22 12:00 PM  Result Value Ref Range   Sodium 139 135 - 145 mmol/L   Potassium 4.0 3.5 - 5.1 mmol/L   Chloride 98 98 - 111 mmol/L   CO2 27 22 - 32 mmol/L   Glucose, Bld 142 (H) 70 - 99 mg/dL    Comment: Glucose reference range applies only to samples taken after fasting for at least 8 hours.   BUN 26 (H) 8 - 23 mg/dL   Creatinine, Ser 1.01 (H) 0.44 - 1.00 mg/dL   Calcium 10.2 8.9 - 10.3 mg/dL   Total Protein 6.6 6.5 - 8.1 g/dL   Albumin 4.1 3.5 - 5.0 g/dL   AST 14 (L) 15 - 41 U/L   ALT 14 0 - 44 U/L   Alkaline Phosphatase 26 (L) 38 - 126 U/L   Total Bilirubin 0.6 0.3 - 1.2 mg/dL   GFR, Estimated 58 (L) >60 mL/min    Comment: (NOTE) Calculated using the CKD-EPI Creatinine Equation (2021)    Anion gap 14 5 - 15    Comment: Performed at [facility] [address] [place], [place] [address]  Brain natriuretic peptide     Status: None    Collection Time: 04/28/22 12:00 PM  Result Value Ref Range   B Natriuretic Peptide 58.7 0.0 - 100.0 pg/mL    Comment: Performed at [facility]  [facility], [address] [place], [place] [address]  Troponin I (High Sensitivity)     Status: None   Collection Time: 04/28/22 12:00 PM  Result Value Ref Range   Troponin I (High Sensitivity) 6 <18 ng/L    Comment: (NOTE) Elevated high sensitivity troponin I (hsTnI) values and significant  changes across serial measurements may suggest ACS but many other  chronic and acute conditions are known to elevate hsTnI results.  Refer to the "Links" section for chest pain algorithms and additional  guidance. Performed at [facility], [address], [place], [place] [address]   Resp Panel by RT-PCR (Flu A&B, Covid) Anterior Nasal Swab     Status: None   Collection Time: 04/28/22 12:41 PM   Specimen: Anterior Nasal Swab  Result Value Ref Range   SARS Coronavirus 2 by RT PCR NEGATIVE NEGATIVE    Comment: (NOTE) SARS-CoV-2 target nucleic acids are NOT DETECTED.  The SARS-CoV-2 RNA is generally detectable in upper respiratory specimens during the acute phase of infection. The lowest concentration of SARS-CoV-2 viral copies this assay can detect is 138 copies/mL. A negative result does not preclude SARS-Cov-2 infection and should not be used as the sole basis for treatment or other patient management decisions. A negative result may occur with  improper specimen collection/handling, submission of specimen other than nasopharyngeal swab, presence of viral mutation(s) within the areas targeted by this assay, and inadequate number of viral copies(<138 copies/mL). A negative result must be combined with clinical observations, patient history, and epidemiological information. The expected result is Negative.  Fact Sheet for Patients:  BloggerCourse.com  Fact Sheet for Healthcare Providers:   SeriousBroker.it  This test is no t yet approved or cleared by the [place] FDA and  has been authorized for detection and/or diagnosis of SARS-CoV-2 by FDA under an Emergency Use Authorization (EUA). This EUA will remain  in effect (meaning this test can be used) for the duration of the COVID-19 declaration under Section 564(b)(1) of the Act, 21 U.S.C.section 360bbb-3(b)(1), unless the authorization is terminated  or revoked sooner.       Influenza A by PCR NEGATIVE NEGATIVE   Influenza B by PCR NEGATIVE NEGATIVE    Comment: (NOTE) The Xpert Xpress SARS-CoV-2/FLU/RSV plus assay is intended as an aid in the diagnosis of influenza from Nasopharyngeal swab specimens and should not be used as a sole basis for treatment. Nasal washings and aspirates are unacceptable for Xpert Xpress SARS-CoV-2/FLU/RSV testing.  Fact Sheet for Patients: BloggerCourse.com  Fact Sheet for Healthcare Providers: SeriousBroker.it  This test is not yet approved or cleared by the [place] FDA and has been authorized for detection and/or diagnosis of SARS-CoV-2 by FDA under an Emergency Use Authorization (EUA). This EUA will remain in effect (meaning this test can be used) for the duration of the COVID-19 declaration under Section 564(b)(1) of the Act, 21 U.S.C. section 360bbb-3(b)(1), unless the authorization is terminated or revoked.  Performed at [facility], [address], [place], [place] [address]   Blood culture (routine x 2)     Status: None (Preliminary result)   Collection Time: 04/28/22 12:43 PM   Specimen: BLOOD RIGHT ARM  Result Value Ref Range   Specimen Description BLOOD RIGHT ARM    Special Requests      BOTTLES DRAWN AEROBIC AND ANAEROBIC Blood Culture results may not be optimal due to an excessive volume of blood received in culture bottles   Culture  NO GROWTH < 24 HOURS Performed at  [facility] [address] [place], [place] [address]    Report Status PENDING   Blood culture (routine x 2)     Status: None (Preliminary result)   Collection Time: 04/28/22 12:58 PM   Specimen: BLOOD LEFT ARM  Result Value Ref Range   Specimen Description BLOOD LEFT ARM    Special Requests      BOTTLES DRAWN AEROBIC AND ANAEROBIC Blood Culture results may not be optimal due to an excessive volume of blood received in culture bottles   Culture      NO GROWTH < 24 HOURS Performed at [facility] [address], [place], [place] [address]    Report Status PENDING   Lactic acid, plasma     Status: None   Collection Time: 04/28/22 12:58 PM  Result Value Ref Range   Lactic Acid, Venous 1.9 0.5 - 1.9 mmol/L    Comment: Performed at [facility], [place] [address] [place], [place] [address]  Lactic acid, plasma     Status: None   Collection Time: 04/28/22  2:24 PM  Result Value Ref Range   Lactic Acid, Venous 1.5 0.5 - 1.9 mmol/L    Comment: Performed at [facility] [address] [place], [place] [address]  Troponin I (High Sensitivity)     Status: None   Collection Time: 04/28/22  2:24 PM  Result Value Ref Range   Troponin I (High Sensitivity) 4 <18 ng/L    Comment: (NOTE) Elevated high sensitivity troponin I (hsTnI) values and significant  changes across serial measurements may suggest ACS but many other  chronic and acute conditions are known to elevate hsTnI results.  Refer to the "Links" section for chest pain algorithms and additional  guidance. Performed at [facility], [place] [address], [place], [place] [id number]   Basic metabolic panel     Status: Abnormal   Collection Time: 04/29/22  5:00 AM  Result Value Ref Range   Sodium 138 135 - 145 mmol/L   Potassium 4.3 3.5 - 5.1 mmol/L   Chloride 100 98 - 111 mmol/L   CO2 25 22 - 32 mmol/L   Glucose, Bld 132 (H) 70 - 99 mg/dL    Comment: Glucose reference range applies only to samples taken  after fasting for at least 8 hours.   BUN 19 8 - 23 mg/dL   Creatinine, Ser 0.88 0.44 - 1.00 mg/dL   Calcium 9.7 8.9 - 10.3 mg/dL   GFR, Estimated >60 >60 mL/min    Comment: (NOTE) Calculated using the CKD-EPI Creatinine Equation (2021)    Anion gap 13 5 - 15    Comment: Performed at [facility] [address] [place],  [address]  CBC     Status: Abnormal   Collection Time: 04/29/22  5:00 AM  Result Value Ref Range   WBC 16.5 (H) 4.0 - 10.5 K/uL   RBC 4.54 3.87 - 5.11 MIL/uL   Hemoglobin 12.1 12.0 - 15.0 g/dL   HCT 39.0 36.0 - 46.0 %   MCV 85.9 80.0 - 100.0 fL   MCH 26.7 26.0 - 34.0 pg   MCHC 31.0 30.0 - 36.0 g/dL   RDW 14.3 11.5 - 15.5 %   Platelets 281 150 - 400 K/uL   nRBC 0.0 0.0 - 0.2 %    Comment: Performed at [facility], [place] [address] [place], [place]  [address]  Procalcitonin     Status: None   Collection Time: 04/29/22  5:00 AM  Result Value Ref Range   Procalcitonin <0.10 ng/mL    Comment:        Interpretation: PCT (Procalcitonin) <= 0.5 ng/mL: Systemic infection (sepsis) is not likely. Local bacterial infection is possible. (NOTE)       Sepsis PCT Algorithm           Lower Respiratory Tract                                      Infection PCT Algorithm    ----------------------------     ----------------------------         PCT < 0.25 ng/mL                PCT < 0.10 ng/mL          Strongly encourage             Strongly discourage   discontinuation of antibiotics    initiation of antibiotics    ----------------------------     -----------------------------       PCT 0.25 - 0.50 ng/mL            PCT 0.10 - 0.25 ng/mL               OR       >80% decrease in PCT            Discourage initiation of                                            antibiotics      Encourage discontinuation           of antibiotics    ----------------------------     -----------------------------         PCT >= 0.50 ng/mL              PCT 0.26 - 0.50 ng/mL                AND        <80% decrease in PCT             Encourage initiation of                                             antibiotics       Encourage continuation           of antibiotics    ----------------------------     -----------------------------        PCT >= 0.50 ng/mL                  PCT > 0.50 ng/mL               AND         increase in PCT                  Strongly encourage  initiation of antibiotics    Strongly encourage escalation           of antibiotics                                     -----------------------------                                           PCT <= 0.25 ng/mL                                                 OR                                        > 80% decrease in PCT                                      Discontinue / Do not initiate                                             antibiotics  Performed at [facility], [address], [place], [place] [address]   Magnesium     Status: None   Collection Time: 04/29/22  5:00 AM  Result Value Ref Range   Magnesium 2.2 1.7 - 2.4 mg/dL    Comment: Performed at [facility], [place] [address] [place], [place] [address]   CT MAXILLOFACIAL WO CONTRAST  Result Date: 04/28/2022 CLINICAL DATA:  Head trauma, abnormal mental status. Sinusitis. Trauma to left face a nasal passage in the past. EXAM: CT MAXILLOFACIAL WITHOUT CONTRAST TECHNIQUE: Multidetector CT imaging of the maxillofacial structures was performed. Multiplanar CT image reconstructions were also generated. RADIATION DOSE REDUCTION: This exam was performed according to the departmental dose-optimization program which includes automated exposure control, adjustment of the mA and/or kV according to patient size and/or use of iterative reconstruction technique. COMPARISON:  03/20/2022. FINDINGS: Osseous: No fracture or mandibular dislocation. No destructive process. Orbits: Negative. No traumatic or  inflammatory finding. Sinuses: There is partial opacification of the right frontal sinus, bilateral ethmoid air cells, bilateral sphenoid and maxillary sinuses. Soft tissues: No significant soft tissue abnormality. Limited intracranial: No acute abnormality.  Atrophy is noted. IMPRESSION: 1. No evidence of acute fracture. 2. Diffuse paranasal sinus disease. Electronically Signed   By: Brett Fairy M.D.   On: 04/28/2022 23:49   DG Chest 2 View  Result Date: 04/28/2022 CLINICAL DATA:  RLL pna; worsening dyspnea/cough despite abx therapy EXAM: CHEST - 2 VIEW COMPARISON:  Radiograph 06/21/2021 FINDINGS: Unchanged cardiomediastinal silhouette and pacemaker leads. Unchanged CardioMEMS device. There is no focal airspace consolidation. Slight improvement in right lower lung airspace disease. Blunted costophrenic sulci suggesting trace effusions. No evidence of pneumothorax. No acute osseous abnormality. Thoracic spondylosis. IMPRESSION: Slight improvement right lower lung airspace disease. Blunted costophrenic sulci suggesting trace effusions. Electronically Signed   By:  Maurine Simmering M.D.   On: 04/28/2022 10:20    Pending Labs Unresulted Labs (From admission, onward)     Start     Ordered   04/29/22 0500  Procalcitonin  Daily at 5am,   R      04/28/22 1539   04/28/22 1629  Acid Fast Smear (AFB)  (AFB smear + Culture w reflexed sensitivities panel)  Once,   R       See Hyperspace for full Linked Orders Report.   04/28/22 1629   04/28/22 1629  Acid Fast Culture with reflexed sensitivities  (AFB smear + Culture w reflexed sensitivities panel)  Once,   R       See Hyperspace for full Linked Orders Report.   04/28/22 1629   04/28/22 1629  Culture, fungus without smear  Once,   R        04/28/22 1629   04/28/22 1540  Respiratory (~20 pathogens) panel by PCR  (Respiratory panel by PCR (~20 pathogens, ~24 hr TAT)  w precautions)  Once,   R        04/28/22 1539   04/28/22 1540  Procalcitonin - Baseline  ONCE -  URGENT,   URGENT        04/28/22 1539   04/28/22 1531  Expectorated Sputum Assessment w Gram Stain, Rflx to Resp Cult  (COPD / Pneumonia / Cellulitis / Lower Extremity Wound)  Once,   R        04/28/22 1533   04/28/22 1255  MRSA Next Gen by PCR, Nasal  (MRSA Screening)  Once,   URGENT        04/28/22 1255            Vitals/Pain Today's Vitals   04/29/22 0732 04/29/22 0813 04/29/22 1215 04/29/22 1226  BP: (!) 154/89  (!) 142/61   Pulse: 73 88 72   Resp: (!) 25 (!) 22 20   Temp: 97.8 F (36.6 C)   97.7 F (36.5 C)  TempSrc: Axillary   Oral  SpO2: 100% 100% 98%   Weight:      Height:      PainSc: 0-No pain       Isolation Precautions Droplet precaution  Medications Medications  methylPREDNISolone sodium succinate (SOLU-MEDROL) 125 mg/2 mL injection 125 mg (125 mg Intravenous Given 04/29/22 1038)  sodium chloride flush (NS) 0.9 % injection 3 mL (3 mLs Intravenous Given 04/29/22 1039)  morphine (PF) 2 MG/ML injection 2 mg (has no administration in time range)  polyethylene glycol (MIRALAX / GLYCOLAX) packet 17 g (has no administration in time range)  bisacodyl (DULCOLAX) EC tablet 5 mg (has no administration in time range)  ondansetron (ZOFRAN) tablet 4 mg (has no administration in time range)    Or  ondansetron (ZOFRAN) injection 4 mg (has no administration in time range)  guaiFENesin (MUCINEX) 12 hr tablet 600 mg (600 mg Oral Given 04/29/22 0006)  hydrALAZINE (APRESOLINE) injection 5 mg (has no administration in time range)  ipratropium-albuterol (DUONEB) 0.5-2.5 (3) MG/3ML nebulizer solution 3 mL (3 mLs Nebulization Given 04/29/22 0731)  albuterol (PROVENTIL) (2.5 MG/3ML) 0.083% nebulizer solution 2.5 mg (has no administration in time range)  rivaroxaban (XARELTO) tablet 20 mg (20 mg Oral Given 04/29/22 0006)  oxyCODONE (Oxy IR/ROXICODONE) immediate release tablet 5 mg (has no administration in time range)  sodium chloride HYPERTONIC 3 % nebulizer solution 4 mL (4 mLs  Nebulization Given 04/29/22 0813)  ceFEPIme (MAXIPIME) 2 g in sodium chloride 0.9 % 100 mL  IVPB (2 g Intravenous New Bag/Given 04/29/22 1254)  vancomycin (VANCOREADY) IVPB 750 mg/150 mL (has no administration in time range)  revefenacin (YUPELRI) nebulizer solution 175 mcg (175 mcg Nebulization Given 04/29/22 1038)  fluticasone furoate-vilanterol (BREO ELLIPTA) 100-25 MCG/ACT 1 puff (1 puff Inhalation Given 04/29/22 1033)  azelastine (ASTELIN) 0.1 % nasal spray 2 spray (2 sprays Each Nare Given 04/29/22 1034)  docusate sodium (COLACE) capsule 100 mg (100 mg Oral Given 04/29/22 1046)  febuxostat (ULORIC) tablet 40 mg (0 mg Oral Hold 04/28/22 2125)  linaclotide (LINZESS) capsule 145 mcg (has no administration in time range)  traMADol (ULTRAM) tablet 50 mg (has no administration in time range)  dofetilide (TIKOSYN) capsule 250 mcg (250 mcg Oral Given 04/29/22 1036)  HYDROcodone bit-homatropine (HYCODAN) 5-1.5 MG/5ML syrup 5 mL (has no administration in time range)  predniSONE (DELTASONE) tablet 10 mg (10 mg Oral Given 04/29/22 1036)  fluticasone (FLONASE) 50 MCG/ACT nasal spray 2 spray (2 sprays Each Nare Given 04/29/22 1034)  acetaminophen (TYLENOL) tablet 650 mg (has no administration in time range)  vancomycin (VANCOREADY) IVPB 1500 mg/300 mL (0 mg Intravenous Stopped 04/28/22 1615)  ceFEPIme (MAXIPIME) 2 g in sodium chloride 0.9 % 100 mL IVPB (0 g Intravenous Stopped 04/28/22 1352)  ipratropium-albuterol (DUONEB) 0.5-2.5 (3) MG/3ML nebulizer solution 3 mL (3 mLs Nebulization Given 04/28/22 1424)    Mobility walks with person assist Low fall risk   Focused Assessments Cardiac Assessment Handoff:  Cardiac Rhythm: Normal sinus rhythm Lab Results  Component Value Date   CKTOTAL CANCELED 01/02/2021   CKMB CANCELED 01/02/2021   TROPONINI <0.03 03/28/2018   No results found for: "DDIMER" Does the Patient currently have chest pain? No .  , Pulmonary Assessment Handoff:  Lung sounds:  Bilateral Breath Sounds: Diminished, Expiratory wheezes L Breath Sounds: Diminished, Expiratory wheezes O2 Device: Nasal Cannula O2 Flow Rate (L/min): 4 L/min    R Recommendations: See Admitting Provider Note  Report given to:   Additional Notes: Very pleasant lady, alert oriented. Her main concern is her inability to breath due intermittent sudden cough. Otherwise independent.

## 2022-04-30 DIAGNOSIS — G4733 Obstructive sleep apnea (adult) (pediatric): Secondary | ICD-10-CM | POA: Diagnosis not present

## 2022-04-30 DIAGNOSIS — J398 Other specified diseases of upper respiratory tract: Secondary | ICD-10-CM | POA: Diagnosis not present

## 2022-04-30 DIAGNOSIS — J181 Lobar pneumonia, unspecified organism: Secondary | ICD-10-CM | POA: Diagnosis not present

## 2022-04-30 DIAGNOSIS — J441 Chronic obstructive pulmonary disease with (acute) exacerbation: Secondary | ICD-10-CM | POA: Diagnosis not present

## 2022-04-30 DIAGNOSIS — J210 Acute bronchiolitis due to respiratory syncytial virus: Secondary | ICD-10-CM | POA: Diagnosis not present

## 2022-04-30 LAB — CBC
HCT: 38.7 % (ref 36.0–46.0)
Hemoglobin: 12.2 g/dL (ref 12.0–15.0)
MCH: 26.9 pg (ref 26.0–34.0)
MCHC: 31.5 g/dL (ref 30.0–36.0)
MCV: 85.2 fL (ref 80.0–100.0)
Platelets: 248 10*3/uL (ref 150–400)
RBC: 4.54 MIL/uL (ref 3.87–5.11)
RDW: 14.6 % (ref 11.5–15.5)
WBC: 14.6 10*3/uL — ABNORMAL HIGH (ref 4.0–10.5)
nRBC: 0 % (ref 0.0–0.2)

## 2022-04-30 LAB — GLUCOSE, CAPILLARY
Glucose-Capillary: 219 mg/dL — ABNORMAL HIGH (ref 70–99)
Glucose-Capillary: 123 mg/dL — ABNORMAL HIGH (ref 70–99)
Glucose-Capillary: 132 mg/dL — ABNORMAL HIGH (ref 70–99)
Glucose-Capillary: 177 mg/dL — ABNORMAL HIGH (ref 70–99)

## 2022-04-30 LAB — BASIC METABOLIC PANEL
Anion gap: 13 (ref 5–15)
BUN: 22 mg/dL (ref 8–23)
CO2: 21 mmol/L — ABNORMAL LOW (ref 22–32)
Calcium: 9.5 mg/dL (ref 8.9–10.3)
Chloride: 103 mmol/L (ref 98–111)
Creatinine, Ser: 0.76 mg/dL (ref 0.44–1.00)
GFR, Estimated: >60 mL/min (ref >60)
Glucose, Bld: 144 mg/dL — ABNORMAL HIGH (ref 70–99)
Potassium: 4.7 mmol/L (ref 3.5–5.1)
Sodium: 137 mmol/L (ref 135–145)

## 2022-04-30 LAB — PROCALCITONIN: Procalcitonin: <0.1 ng/mL

## 2022-04-30 LAB — MRSA NEXT GEN BY PCR, NASAL: MRSA by PCR Next Gen: NOT DETECTED

## 2022-04-30 MED ORDER — IPRATROPIUM-ALBUTEROL 0.5-2.5 (3) MG/3ML IN SOLN
3.0000 mL | RESPIRATORY_TRACT | Status: DC | PRN
  Administered 2022-05-01 – 2022-05-02 (×4): 3 mL via RESPIRATORY_TRACT
  Filled 2022-04-30 (×5): qty 3

## 2022-04-30 MED ORDER — FLUTICASONE FUROATE-VILANTEROL 200-25 MCG/ACT IN AEPB
1.0000 | INHALATION_SPRAY | Freq: Every day | RESPIRATORY_TRACT | Status: DC
  Administered 2022-04-30 – 2022-05-04 (×5): 1 via RESPIRATORY_TRACT
  Filled 2022-04-30: qty 28

## 2022-04-30 MED ORDER — PREDNISONE 20 MG PO TABS
60.0000 mg | ORAL_TABLET | Freq: Every day | ORAL | Status: DC
  Administered 2022-05-01 – 2022-05-02 (×2): 60 mg via ORAL
  Filled 2022-04-30 (×2): qty 3

## 2022-04-30 NOTE — Progress Notes (Signed)
Kathleen [name]  JOA:[id number] DOB: 02/27/1946 DOA: 04/28/2022 PCP: [name], MD    Brief Narrative:  76 year old with a history of atrial fibrillation, BOOP, tracheobronchomalacia, COPD, OSA on BiPAP, pacemaker, and chronic CHF who presented to the ER with shortness of breath.  She was seen by her Pulmonologist 11/14 and again 11/21 AM and despite antibiotic therapy her symptoms progressed.  Consultants:  None  Goals of Care:  Code Status: Full Code   DVT prophylaxis: Xarelto  Interim Hx: Very slowly improving.  The patient still feels that her breathing is not back to her baseline and she reports feeling extremely weak in general.  She denies chest pain nausea vomiting or headache.  Assessment & Plan:  RSV pneumonia/URI - BOOP COVID and influenza negative - prednisone added by pulmonary -her respiratory viral panel returned positive for RSV today, and clinically this is consistent with her course/presentation -continue supportive care -rehabilitation stay may be necessary  Chronic sinusitis Sinus CT noted diffuse paranasal sinus disease -needs to follow-up with ENT as an outpatient  Tracheobronchomalacia - OSA Continue nightly BiPAP -poorly compliant with BiPAP at home -recommendations per pulmonary  COPD with acute exacerbation Requires 4L [place] support at baseline at home - wean back towards baseline as able  Chronic combined systolic and diastolic CHF without acute exacerbation Currently well compensated -monitor weights and ins and outs  HTN Blood pressure reasonably controlled -follow without change today  Chronic atrial fibrillation Continue Tikosyn and Xarelto -heart rate controlled at present  DM Diet controlled/does not require home medication - monitor CBG while on steroid  CKD stage IIIa Baseline creatinine appears to be approximately 0.8 -creatinine ever so slightly elevated at admission at 1.0, and has now returned to her baseline   Family  Communication: No family present Disposition: From ILF - anticipate eventual discharge to same ILF unless SNF rehab stay is indicated   Objective: Blood pressure 139/65, pulse 83, temperature 97.8 F (36.6 C), temperature source Oral, resp. rate 17, height 5\' 1"  (1.549 m), weight 83 kg, SpO2 97 %.  Intake/Output Summary (Last 24 hours) at 04/30/2022 1105 Last data filed at 04/30/2022 0400 Gross per 24 hour  Intake 350 ml  Output --  Net 350 ml    Filed Weights   04/28/22 1132  Weight: 83 kg    Examination: General: No acute respiratory distress Lungs: Poor air movement bilaterally -scattered crackles without change Cardiovascular: Regular rate without murmur gallop or rub  Abdomen: Nontender, nondistended, soft, bowel sounds positive Extremities: No significant edema bilateral lower extremities  CBC: Recent Labs  Lab 04/28/22 1200 04/29/22 0500 04/30/22 0158  WBC 13.7* 16.5* 14.6*  NEUTROABS 11.8*  --   --   HGB 13.4 12.1 12.2  HCT 41.3 39.0 38.7  MCV 83.6 85.9 85.2  PLT 281 281 248    Basic Metabolic Panel: Recent Labs  Lab 04/28/22 1200 04/29/22 0500 04/30/22 0158  NA 139 138 137  K 4.0 4.3 4.7  CL 98 100 103  CO2 27 25 21*  GLUCOSE 142* 132* 144*  BUN 26* 19 22  CREATININE 1.01* 0.88 0.76  CALCIUM 10.2 9.7 9.5  MG  --  2.2  --     GFR: Estimated Creatinine Clearance: 59.4 mL/min (by C-G formula based on SCr of 0.76 mg/dL).   Scheduled Meds:  azelastine  2 spray Each Nare BID   docusate sodium  100 mg Oral BID   dofetilide  250 mcg Oral BID   febuxostat  40 mg Oral QHS   fluticasone  2 spray Each Nare Daily   fluticasone furoate-vilanterol  1 puff Inhalation Daily   insulin aspart  0-9 Units Subcutaneous TID WC   ipratropium-albuterol  3 mL Nebulization Q6H   methylPREDNISolone (SOLU-MEDROL) injection  125 mg Intravenous Daily   predniSONE  10 mg Oral Q breakfast   revefenacin  175 mcg Nebulization Daily   rivaroxaban  20 mg Oral Q supper    sodium chloride flush  3 mL Intravenous Q12H   sodium chloride HYPERTONIC  4 mL Nebulization TID   Continuous Infusions:  ceFEPime (MAXIPIME) IV 2 g (04/30/22 0054)   vancomycin Stopped (04/29/22 1945)     LOS: 2 days   Cherene Altes, MD [facility] Office  [phone] Pager - Text Page per Kathleen [name]  If 7PM-7AM, please contact night-coverage per Amion 04/30/2022, 11:05 AM

## 2022-04-30 NOTE — Progress Notes (Signed)
NAME:  Kathleen [name], MRN:  [id number], DOB:  1946-04-05, LOS: 2 ADMISSION DATE:  04/28/2022, CONSULTATION DATE: 11/21 REFERRING MD: Dr. Ophelia Charter, CHIEF COMPLAINT: Pneumonia  BRIEF  76 year old female with past medical history as below, which is significant for COPD, BOOP, on 4 L home O2, tracheobronchomalacia, OSA noncompliant with home BiPAP, paroxysmal atrial fibrillation on Xarelto and former smoker.  She is a patient of Dr. Vassie Loll in the pulmonary clinic.  Her tracheobronchomalacia has been worked up at [facility] and she was offered a stent trial followed by tracheal bronchoplasty but was considered high risk and she did decided to decline.  She has been noncompliant with NIMV since that time as well.  COPD controller medications include Johnathan Hausen, and albuterol.  She was recently admitted to [facility] from 10/10 to 10/15 and was treated for CAP, and COPD exacerbation with improvement in her symptoms. Symptoms quickly returned after discharge to home. She presented to the pulmonary clinic 11/14 with complaints of recurrent fevers and productive cough. And was again treated with antibiotics and steroids. Returned to clinic 11/21 with worsening symptoms and was sent to the emergency department.   Upon arrival to the ED she was complaining of dyspnea, fatigue, and right sided chest pain. She was requiring 6L [place] to maintain sats in the 90s. Cough is non-productive currently and comes and goes at times. She was admitted to the hospitalist service for treatment of PNA and COPD exacerbation. PCCM has been asked to evaluate.   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   reports that she quit smoking about 34 years ago. Her smoking use included cigarettes. She has a 20.00 pack-year smoking history. She has been exposed to tobacco smoke. She has never used smokeless tobacco.   Latest Reference Range & Units 12/22/11 15:18 12/26/12 06:29 12/27/12 05:45 12/28/12 07:48 06/23/15 14:52 07/19/15 14:37 09/08/15 17:20 09/17/15 16:28 06/28/19 04:27 11/07/20 09:46 11/16/20 03:38 11/18/20 05:05 04/22/21 11:39 05/10/21 12:24 05/11/21 07:07 05/23/21 09:42 09/03/21 10:55 10/04/21 17:35 11/12/21 11:59 01/19/22 11:10 03/17/22 18:47 04/21/22 13:12 04/28/22 12:00  Eosinophils Absolute 0.0 - 0.5 K/uL 0.1 0.0 0.0 0.0 0.0 0.1 0.0 0.2 0.1 0.1 0.1 0.2 0.2 0.1 0.2 0.1 0.1 0.2 0.0 172 0.0 0.0 0.0        No data to display          Latest Reference Range & Units 03/20/22 13:16  pH, Arterial 7.35 - 7.45  7.343 (L)  pCO2 arterial 32 - 48 mmHg 33.4  pO2, Arterial 83 - 108 mmHg 87  (L): Data is abnormally low  Significant Hospital Events: Including procedures, antibiotic start and stop dates in addition to other pertinent events   03/18/22 - C chest emphysema + 11/21 admitted BNP 59, trop 4 RSV positive PCR CT snus - diffuse paranasal sinus disease  11/22 - Overnight slept poorly-- only slept about an hour. Coughing is her most bothersome symptom.  Interim History / Subjective:   11/23 -  No fevver since 04/28/22.  WBC down.  On cefepime and vanmc  Baseline 4L [place] x 2 years. Tried dupiexent few years ago for few months (before publication of BOREAS trial for recc AECOPD) without  benefit.  Baseline eos normal. Does not currently feel fully well. Currenty on  - o2 5L [place] (baseline 4L) - breo 100 (baseline 200 and wants it back)  - yupelri - dupnebx scheduled Q6h ( - I Vsteropds since admit -> changing to po pred 60 05/01/22 (she is frustated with recc aecopd) - off all  antibiotics   Objective   Blood pressure 136/80, pulse 74, temperature 98 F (36.7 C), temperature source Oral, resp. rate 20, height 5\' 1"  (1.549 m), weight 83 kg, SpO2 96 %.    FiO2 (%):  [30 %] 30 %   Intake/Output Summary (Last 24 hours) at 04/30/2022 1450 Last data filed at 04/30/2022 1318 Gross per 24 hour  Intake 350 ml  Output --  Net 350 ml   Filed Weights   04/28/22 1132  Weight: 83 kg    Examination: General: No distress. Looks well. STbale Obsese. On o2. Mild cushingoid Neuro: Alert and Oriented x 3. GCS 15. Speech normal Psych: Pleasant Resp:  Barrel Chest - no.  Wheeze - mild faint wheeze, Crackles - no, No overt respiratory distress CVS: Normal heart sounds. Murmurs - no Ext: Stigmata of Connective Tissue Disease - n HEENT: Normal upper airway. PEERL +. No post nasal drip  Resolved Hospital Problem list     Assessment & Plan:   Chronic hypoxic respiratory failure- she has extensive pulmonary history including COPD, BOOP, Tracheobronchomalacia, and OSA. She has had difficulty with compliance with NIMV. She has failed multiple courses of antibiotics and steroids as an outpatient. Imaging showed improvement,  Currently admitted with RSV - AECOPD  Plan  - ok to change to pred 68m 05/01/22 and taper and stop in 12 days  - chagne breo to 200 - continue Yupelri - dc duoneb schedueld -> chagne to PRN - No role for  Dupixent in aecopd reduction ( Failed it in past  and no high eos) - = consider ARNASA COPD trial   Best Practice (right click and "Reselect all SmartList Selections" daily)   Per primay     SIGNATURE    Dr. 05/03/22, M.D., F.C.C.P,  Pulmonary and Critical Care Medicine Staff Physician, [facility] Center Director - Interstitial Lung Disease  Program  Medical Director - [facility] Long ICU Pulmonary Fibrosis [facility] Network at [facility] [place], [place], [place]   Pager: [phone], If no answer   -> Check AMION or Try [phone] Telephone (clinical office): [phone] Telephone (research): [phone]  2:50 PM 04/30/2022    LABS   Anti-infectives (From admission, onward)    Start     Dose/Rate Route Frequency Ordered Stop   04/29/22 1400  vancomycin (VANCOREADY) IVPB 750 mg/150 mL  Status:  Discontinued        750 mg 150 mL/hr over 60 Minutes Intravenous Every 24 hours 04/28/22 1608 04/30/22 1110   04/29/22 0130  ceFEPIme (MAXIPIME) 2 g in sodium chloride 0.9 % 100 mL IVPB  Status:  Discontinued        2 g 200 mL/hr over 30 Minutes Intravenous Every 12 hours 04/28/22 1608 04/30/22 1110   04/28/22 1300  vancomycin (VANCOREADY) IVPB 1500 mg/300 mL        1,500 mg 150 mL/hr over 120 Minutes Intravenous  Once 04/28/22 1255 04/28/22 1615   04/28/22 1300  ceFEPIme (MAXIPIME) 2 g in sodium chloride 0.9 % 100 mL IVPB        2 g 200 mL/hr  over 30 Minutes Intravenous  Once 04/28/22 1255 04/28/22 1352        PULMONARY No results for input(s): "PHART", "PCO2ART", "PO2ART", "HCO3", "TCO2", "O2SAT" in the last 168 hours.  Invalid input(s): "PCO2", "PO2"  CBC Recent Labs  Lab 04/28/22 1200 04/29/22 0500 04/30/22 0158  HGB 13.4 12.1 12.2  HCT 41.3 39.0 38.7  WBC 13.7* 16.5* 14.6*  PLT 281 281 248    COAGULATION No results for input(s): "INR" in the last 168 hours.  CARDIAC  No results for input(s): "TROPONINI" in the last 168 hours. No results for input(s): "PROBNP" in the last 168 hours.   CHEMISTRY Recent Labs  Lab 04/28/22 1200 04/29/22 0500 04/30/22 0158  NA 139 138 137  K 4.0 4.3 4.7  CL 98 100 103  CO2 27 25 21*  GLUCOSE 142* 132* 144*  BUN 26* 19 22  CREATININE 1.01* 0.88 0.76  CALCIUM 10.2 9.7 9.5  MG  --  2.2  --    Estimated Creatinine Clearance: 59.4 mL/min (by C-G formula based on SCr of 0.76 mg/dL).   LIVER Recent Labs  Lab 04/28/22 1200  AST 14*  ALT 14  ALKPHOS 26*  BILITOT 0.6  PROT 6.6  ALBUMIN 4.1      INFECTIOUS Recent Labs  Lab 04/28/22 1258 04/28/22 1424 04/29/22 0500 04/30/22 0158  LATICACIDVEN 1.9 1.5  --   --   PROCALCITON  --   --  <0.10 <0.10     ENDOCRINE CBG (last 3)  Recent Labs    04/29/22 2050 04/30/22 0807 04/30/22 1210  GLUCAP 204* 219* 123*         IMAGING x48h  - image(s) personally visualized  -   highlighted in bold CT MAXILLOFACIAL WO CONTRAST  Result Date: 04/28/2022 CLINICAL DATA:  Head trauma, abnormal mental status. Sinusitis. Trauma to left face a nasal passage in the past. EXAM: CT MAXILLOFACIAL WITHOUT CONTRAST TECHNIQUE: Multidetector CT imaging of the maxillofacial structures was performed. Multiplanar CT image reconstructions were also generated. RADIATION DOSE REDUCTION: This exam was performed according to the departmental dose-optimization program which includes automated exposure control, adjustment of the mA and/or kV according to patient size and/or use of iterative reconstruction technique. COMPARISON:  03/20/2022. FINDINGS: Osseous: No fracture or mandibular dislocation. No destructive process. Orbits: Negative. No traumatic or inflammatory finding. Sinuses: There is partial opacification of the right frontal sinus, bilateral ethmoid air cells, bilateral sphenoid and maxillary sinuses. Soft tissues: No significant soft tissue abnormality. Limited intracranial: No acute abnormality.  Atrophy is noted. IMPRESSION: 1. No evidence of acute fracture. 2. Diffuse paranasal sinus disease. Electronically Signed   By: Thornell Sartorius M.D.   On: 04/28/2022 23:49

## 2022-05-01 ENCOUNTER — Telehealth: Payer: Self-pay | Admitting: Internal Medicine

## 2022-05-01 DIAGNOSIS — J441 Chronic obstructive pulmonary disease with (acute) exacerbation: Secondary | ICD-10-CM

## 2022-05-01 DIAGNOSIS — J210 Acute bronchiolitis due to respiratory syncytial virus: Secondary | ICD-10-CM

## 2022-05-01 LAB — CBC
HCT: 37 % (ref 36.0–46.0)
Hemoglobin: 11.7 g/dL — ABNORMAL LOW (ref 12.0–15.0)
MCH: 27.1 pg (ref 26.0–34.0)
MCHC: 31.6 g/dL (ref 30.0–36.0)
MCV: 85.8 fL (ref 80.0–100.0)
Platelets: 241 10*3/uL (ref 150–400)
RBC: 4.31 MIL/uL (ref 3.87–5.11)
RDW: 14.7 % (ref 11.5–15.5)
WBC: 13.5 10*3/uL — ABNORMAL HIGH (ref 4.0–10.5)
nRBC: 0.2 % (ref 0.0–0.2)

## 2022-05-01 LAB — BASIC METABOLIC PANEL
Anion gap: 10 (ref 5–15)
BUN: 25 mg/dL — ABNORMAL HIGH (ref 8–23)
CO2: 28 mmol/L (ref 22–32)
Calcium: 9.9 mg/dL (ref 8.9–10.3)
Chloride: 101 mmol/L (ref 98–111)
Creatinine, Ser: 1 mg/dL (ref 0.44–1.00)
GFR, Estimated: 59 mL/min — ABNORMAL LOW (ref >60)
Glucose, Bld: 136 mg/dL — ABNORMAL HIGH (ref 70–99)
Potassium: 4.8 mmol/L (ref 3.5–5.1)
Sodium: 139 mmol/L (ref 135–145)

## 2022-05-01 LAB — MAGNESIUM: Magnesium: 2.3 mg/dL (ref 1.7–2.4)

## 2022-05-01 LAB — HEMOGLOBIN A1C
Hgb A1c MFr Bld: 5.8 % — ABNORMAL HIGH (ref 4.8–5.6)
Mean Plasma Glucose: 120 mg/dL

## 2022-05-01 LAB — SEDIMENTATION RATE: Sed Rate: 1 mm/hr (ref 0–22)

## 2022-05-01 LAB — GLUCOSE, CAPILLARY
Glucose-Capillary: 110 mg/dL — ABNORMAL HIGH (ref 70–99)
Glucose-Capillary: 129 mg/dL — ABNORMAL HIGH (ref 70–99)
Glucose-Capillary: 179 mg/dL — ABNORMAL HIGH (ref 70–99)

## 2022-05-01 MED ORDER — HYDROCOD POLI-CHLORPHE POLI ER 10-8 MG/5ML PO SUER
5.0000 mL | Freq: Two times a day (BID) | ORAL | Status: DC
  Administered 2022-05-01 – 2022-05-04 (×6): 5 mL via ORAL
  Filled 2022-05-01 (×6): qty 5

## 2022-05-01 MED ORDER — OXYCODONE HCL 5 MG PO TABS
5.0000 mg | ORAL_TABLET | ORAL | Status: DC | PRN
  Administered 2022-05-01 – 2022-05-02 (×2): 5 mg via ORAL
  Filled 2022-05-01 (×2): qty 1

## 2022-05-01 MED ORDER — SALINE SPRAY 0.65 % NA SOLN
1.0000 | NASAL | Status: DC | PRN
  Filled 2022-05-01 (×2): qty 44

## 2022-05-01 MED ORDER — BENZONATATE 100 MG PO CAPS: 200.0000 mg | ORAL_CAPSULE | Freq: Three times a day (TID) | ORAL | Status: DC | PRN

## 2022-05-01 MED ORDER — GUAIFENESIN ER 600 MG PO TB12
1200.0000 mg | ORAL_TABLET | Freq: Two times a day (BID) | ORAL | Status: DC
  Administered 2022-05-01 – 2022-05-04 (×6): 1200 mg via ORAL
  Filled 2022-05-01 (×6): qty 2

## 2022-05-01 MED ORDER — TRAMADOL HCL 50 MG PO TABS: 50.0000 mg | ORAL_TABLET | Freq: Four times a day (QID) | ORAL | Status: DC | PRN

## 2022-05-01 NOTE — Progress Notes (Signed)
NAME:  Kathleen [name], MRN:  [id number], DOB:  Apr 06, 1946, LOS: 3 ADMISSION DATE:  04/28/2022, CONSULTATION DATE: 11/21 REFERRING MD: Dr. Lorin Mercy, CHIEF COMPLAINT: Pneumonia  BRIEF  76 year old female with past medical history as below, which is significant for COPD, BOOP, on 4 L home O2, tracheobronchomalacia, OSA noncompliant with home BiPAP, paroxysmal atrial fibrillation on Xarelto and former smoker.  She is a patient of Dr. Elsworth Soho in the pulmonary clinic.  Her tracheobronchomalacia has been worked up at [facility] and she was offered a stent trial followed by tracheal bronchoplasty but was considered high risk and she did decided to decline.  She has been noncompliant with NIMV since that time as well.  COPD controller medications include Maurine Minister, and albuterol.  She was recently admitted to [facility] from 10/10 to 10/15 and was treated for CAP, and COPD exacerbation with improvement in her symptoms. Symptoms quickly returned after discharge to home. She presented to the pulmonary clinic 11/14 with complaints of recurrent fevers and productive cough. And was again treated with antibiotics and steroids. Returned to clinic 11/21 with worsening symptoms and was sent to the emergency department.   Upon arrival to the ED she was complaining of dyspnea, fatigue, and right sided chest pain. She was requiring 6L [place] to maintain sats in the 90s. Cough is non-productive currently and comes and goes at times. She was admitted to the hospitalist service for treatment of PNA and COPD exacerbation. PCCM has been asked to evaluate.   Pertinent  Medical History   has a past medical history of Asthma, 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[place]), CHF (congestive heart failure) ([place]), Chronic renal insufficiency, Complete heart block ([place]) s/p AV nodal ablation, Concussion (10/04/2021), COPD (chronic obstructive pulmonary disease) ([place]), Gout, Hypertension, Longstanding persistent atrial fibrillation  ([place]), Nonischemic cardiomyopathy ([place]), Obesity, Pacemaker, and Spontaneous pneumothorax (2013).   reports that she quit smoking about 34 years ago. Her smoking use included cigarettes. She has a 20.00 pack-year smoking history. She has been exposed to tobacco smoke. She has never used smokeless tobacco.   Latest Reference Range & Units 12/22/11 15:18 12/26/12 06:29 12/27/12 05:45 12/28/12 07:48 06/23/15 14:52 07/19/15 14:37 09/08/15 17:20 09/17/15 16:28 06/28/19 04:27 11/07/20 09:46 11/16/20 03:38 11/18/20 05:05 04/22/21 11:39 05/10/21 12:24 05/11/21 07:07 05/23/21 09:42 09/03/21 10:55 10/04/21 17:35 11/12/21 11:59 01/19/22 11:10 03/17/22 18:47 04/21/22 13:12 04/28/22 12:00  Eosinophils Absolute 0.0 - 0.5 K/uL 0.1 0.0 0.0 0.0 0.0 0.1 0.0 0.2 0.1 0.1 0.1 0.2 0.2 0.1 0.2 0.1 0.1 0.2 0.0 172 0.0 0.0 0.0        No data to display          Latest Reference Range & Units 03/20/22 13:16  pH, Arterial 7.35 - 7.45  7.343 (L)  pCO2 arterial 32 - 48 mmHg 33.4  pO2, Arterial 83 - 108 mmHg 87  (L): Data is abnormally low  Significant Hospital Events: Including procedures, antibiotic start and stop dates in addition to other pertinent events   03/18/22 - C chest emphysema + 11/21 admitted BNP 59, trop 4 RSV positive PCR CT snus - diffuse paranasal sinus disease  11/22 - Overnight slept poorly-- only slept about an hour. Coughing is her most bothersome symptom.  Interim History / Subjective:   11/23 -  No fevver since 04/28/22.  WBC down.  On cefepime and vanmc  Baseline 4L [place] x 2 years. Tried dupiexent few years ago for few months (before publication of BOREAS trial for recc AECOPD) without  benefit.  Baseline eos normal. Does not currently feel fully well. Currenty on  - o2 5L [place] (baseline 4L) - breo 100 (baseline 200 and wants it back)  - yupelri - dupnebx scheduled Q6h ( - I Vsteropds since admit -> changing to po pred 60 05/02/22 (she is frustated with recc aecopd) - off all  antibiotics   Objective   Blood pressure 138/75, pulse 74, temperature 97.6 F (36.4 C), temperature source Oral, resp. rate 18, height 5\' 1"  (1.549 m), weight 83 kg, SpO2 99 %.        Intake/Output Summary (Last 24 hours) at 05/01/2022 05/03/2022 Last data filed at 05/01/2022 05/03/2022 Gross per 24 hour  Intake 4 ml  Output --  Net 4 ml   Filed Weights   04/28/22 1132  Weight: 83 kg    Examination: General: Obese female in no acute distress on 4 L nasal cannula HEENT: MM pink/moist tender left frontal sinus area to palpation  Neuro: Grossly intact without focal defect CV: Heart sounds are regular PULM: Coarse rhonchi  GI: soft, bsx4 active  GU: Extr  warm/dry, 1+ lower edema  Skin: no rashes or lesions     Resolved Hospital Problem list     Assessment & Plan:   Chronic hypoxic respiratory failure- she has extensive pulmonary history including COPD, BOOP, Tracheobronchomalacia, and OSA. She has had difficulty with compliance with NIMV. She has failed multiple courses of antibiotics and steroids as an outpatient. Imaging showed improvement,  Currently admitted with RSV - AECOPD  Plan Change to oral prednisone and on weaning protocol Breo was increased to 200 Continue Yupelri  Add nasal salt water for sinus infections as documented by CT scan She reports no interest in being in research trials.  May need further updates per physician.     Best Practice (right click and "Reselect all SmartList Selections" daily)   Per primay     SIGNATURE    Dr. 04/30/22, M.D., F.C.C.P,  Pulmonary and Critical Care Medicine Staff Physician, [facility] Center Director - Interstitial Lung Disease  Program  Medical Director - [facility] Long ICU Pulmonary Fibrosis [facility] Network at [facility] [place], [place], [place]   Pager: [phone], If no answer  -> Check AMION or Try [phone] Telephone (clinical office): [phone] Telephone  (research): [phone]  8:38 AM 05/01/2022    LABS   Anti-infectives (From admission, onward)    Start     Dose/Rate Route Frequency Ordered Stop   04/29/22 1400  vancomycin (VANCOREADY) IVPB 750 mg/150 mL  Status:  Discontinued        750 mg 150 mL/hr over 60 Minutes Intravenous Every 24 hours 04/28/22 1608 04/30/22 1110   04/29/22 0130  ceFEPIme (MAXIPIME) 2 g in sodium chloride 0.9 % 100 mL IVPB  Status:  Discontinued        2 g 200 mL/hr over 30 Minutes Intravenous Every 12 hours 04/28/22 1608 04/30/22 1110   04/28/22 1300  vancomycin (VANCOREADY) IVPB 1500 mg/300 mL        1,500 mg 150 mL/hr over 120 Minutes Intravenous  Once 04/28/22 1255 04/28/22 1615   04/28/22 1300  ceFEPIme (MAXIPIME) 2 g in sodium chloride 0.9 % 100 mL IVPB        2 g 200 mL/hr over 30 Minutes Intravenous  Once 04/28/22 1255 04/28/22 1352        PULMONARY No results for input(s): "PHART", "PCO2ART", "PO2ART", "  HCO3", "TCO2", "O2SAT" in the last 168 hours.  Invalid input(s): "PCO2", "PO2"  CBC Recent Labs  Lab 04/29/22 0500 04/30/22 0158 05/01/22 0229  HGB 12.1 12.2 11.7*  HCT 39.0 38.7 37.0  WBC 16.5* 14.6* 13.5*  PLT 281 248 241    COAGULATION No results for input(s): "INR" in the last 168 hours.  CARDIAC  No results for input(s): "TROPONINI" in the last 168 hours. No results for input(s): "PROBNP" in the last 168 hours.   CHEMISTRY Recent Labs  Lab 04/28/22 1200 04/29/22 0500 04/30/22 0158 05/01/22 0229  NA 139 138 137 139  K 4.0 4.3 4.7 4.8  CL 98 100 103 101  CO2 27 25 21* 28  GLUCOSE 142* 132* 144* 136*  BUN 26* 19 22 25*  CREATININE 1.01* 0.88 0.76 1.00  CALCIUM 10.2 9.7 9.5 9.9  MG  --  2.2  --  2.3   Estimated Creatinine Clearance: 47.5 mL/min (by C-G formula based on SCr of 1 mg/dL).   LIVER Recent Labs  Lab 04/28/22 1200  AST 14*  ALT 14  ALKPHOS 26*  BILITOT 0.6  PROT 6.6  ALBUMIN 4.1     INFECTIOUS Recent Labs  Lab 04/28/22 1258  04/28/22 1424 04/29/22 0500 04/30/22 0158  LATICACIDVEN 1.9 1.5  --   --   PROCALCITON  --   --  <0.10 <0.10     ENDOCRINE CBG (last 3)  Recent Labs    04/30/22 1552 04/30/22 2018 05/01/22 0752  GLUCAP 132* 177* 110*         IMAGING x48h  - image(s) personally visualized  -   highlighted in bold No results found.   Richardson Landry Dupree Givler ACNP Acute Care Nurse Practitioner [place] Please consult Amion 05/01/2022, 8:38 AM

## 2022-05-01 NOTE — Progress Notes (Signed)
Occupational Therapy Treatment Patient Details Name: Kathleen [name] MRN: [id number] DOB: 11/13/1945 Today's Date: 05/01/2022   History of present illness 76 y.o. female with presenting 04/28/22 with SOB and cough. She was seen by her Pulmonologist 11/14 and again 11/21 AM and despite antibiotic therapy her symptoms progressed. 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 Patient was supervision to ambulate to sink and stood at sink to perform bathing and dressing with seated rest break between tasks and increased time to recover.  Patient is aware of limitations and is cautious of over exhaustion. Patient to continue to be followed by acute OT.    Recommendations for follow up therapy are one component of a multi-disciplinary discharge planning process, led by the attending physician.  Recommendations may be updated based on patient status, additional functional criteria and insurance authorization.    Follow Up Recommendations  No OT follow up     Assistance Recommended at Discharge Intermittent Supervision/Assistance  Patient can return home with the following  Assist for transportation;Assistance with cooking/housework   Equipment Recommendations  None recommended by OT    Recommendations for Other Services      Precautions / Restrictions Precautions Precautions: Fall Precaution Comments: Increasing O2 need (4-5L baseline).  Watch HR Restrictions Weight Bearing Restrictions: No       Mobility Bed Mobility Overal bed mobility: Modified Independent             General bed mobility comments: seated on EOB at beginning and end of session    Transfers Overall transfer level: Needs assistance Equipment used: Rolling walker (2 wheels) Transfers: Sit to/from Stand, Bed to chair/wheelchair/BSC Sit to Stand:  Supervision     Step pivot transfers: Supervision     General transfer comment: supervision for safety     Balance Overall balance assessment: Needs assistance Sitting-balance support: Feet supported Sitting balance-Leahy Scale: Good     Standing balance support: No upper extremity supported, Bilateral upper extremity supported, During functional activity Standing balance-Leahy Scale: Fair Standing balance comment: able to stand at sink for self care without UE support                           ADL either performed or assessed with clinical judgement   ADL Overall ADL's : Needs assistance/impaired     Grooming: Wash/dry hands;Wash/dry face;Oral care;Standing;Supervision/safety Grooming Details (indicate cue type and reason): standing at sink Upper Body Bathing: Supervision/ safety;Standing   Lower Body Bathing: Supervison/ safety;Sit to/from stand Lower Body Bathing Details (indicate cue type and reason): stood for peri care Upper Body Dressing : Set up   Lower Body Dressing: Min guard Lower Body Dressing Details (indicate cue type and reason): to change underwear               General ADL Comments: Patient required frequent rest breaks during self care    Extremity/Trunk Assessment              Vision       Perception     Praxis      Cognition Arousal/Alertness: Awake/alert Behavior During Therapy: WFL for tasks assessed/performed Overall Cognitive Status: Within Functional Limits for tasks assessed  Exercises      Shoulder Instructions       General Comments VSS on 5liters O2    Pertinent Vitals/ Pain       Pain Assessment Pain Assessment: No/denies pain  Home Living                                          Prior Functioning/Environment              Frequency  Min 2X/week        Progress Toward Goals  OT Goals(current goals can now be  found in the care plan section)  Progress towards OT goals: Progressing toward goals  Acute Rehab OT Goals Patient Stated Goal: get better OT Goal Formulation: With patient Time For Goal Achievement: 05/13/22 Potential to Achieve Goals: Good ADL Goals Pt Will Perform Grooming: with modified independence;standing Pt Will Perform Lower Body Dressing: with modified independence;sit to/from stand Pt Will Transfer to Toilet: with modified independence;ambulating;regular height toilet  Plan Discharge plan remains appropriate    Co-evaluation                 AM-PAC OT "6 Clicks" Daily Activity     Outcome Measure   Help from another person eating meals?: None Help from another person taking care of personal grooming?: A Little Help from another person toileting, which includes using toliet, bedpan, or urinal?: A Little Help from another person bathing (including washing, rinsing, drying)?: A Little Help from another person to put on and taking off regular upper body clothing?: None Help from another person to put on and taking off regular lower body clothing?: A Little 6 Click Score: 20    End of Session Equipment Utilized During Treatment: Rolling walker (2 wheels);Oxygen (5 liters)  OT Visit Diagnosis: Unsteadiness on feet (R26.81);History of falling (Z91.81);Muscle weakness (generalized) (M62.81)   Activity Tolerance Patient tolerated treatment well   Patient Left in bed;with call bell/phone within reach (seated on EOB)   Nurse Communication Mobility status        Time: 6063-0160 OT Time Calculation (min): 45 min  Charges: OT General Charges $OT Visit: 1 Visit OT Treatments $Self Care/Home Management : 38-52 mins  Alfonse Flavors, OTA Acute Rehabilitation Services  Office [phone]   Dewain Penning 05/01/2022, 10:06 AM

## 2022-05-01 NOTE — Progress Notes (Addendum)
Physical Therapy Treatment Patient Details Name: Kathleen [name] MRN: [id number] DOB: 11-Feb-1946 Today's Date: 05/01/2022   History of Present Illness 76 y.o. female with presenting 04/28/22 with SOB and cough. She was seen by her Pulmonologist 11/14 and again 11/21 AM and despite antibiotic therapy her symptoms progressed. Pt with nonresolving RML pneumonia - BOOP. Pt +RSV. PMH: afib, BOOP, chronic combined CHF, tracheobronchomalacia, COPD, HTN, OSA on BIPAP, and pacemaker placement    PT Comments    Pt making good progress with mobility. Should be able to return home when medically ready and can resume HHPT.    Recommendations for follow up therapy are one component of a multi-disciplinary discharge planning process, led by the attending physician.  Recommendations may be updated based on patient status, additional functional criteria and insurance authorization.  Follow Up Recommendations  Home health PT (resume)     Assistance Recommended at Discharge PRN  Patient can return home with the following Assistance with cooking/housework;Assist for transportation   Equipment Recommendations  None recommended by PT    Recommendations for Other Services       Precautions / Restrictions Precautions Precautions: Fall Precaution Comments: Increasing O2 need (4-5L baseline).  Watch HR Restrictions Weight Bearing Restrictions: No     Mobility  Bed Mobility               General bed mobility comments: Pt sitting EOB    Transfers Overall transfer level: Modified independent Equipment used: Rollator (4 wheels) Transfers: Sit to/from Stand Sit to Stand: Modified independent (Device/Increase time)           General transfer comment: From bed and from toilet    Ambulation/Gait Ambulation/Gait assistance: Supervision Gait Distance (Feet): 275 Feet Assistive device: Rollator (4 wheels) Gait Pattern/deviations: Step-through pattern, Decreased stride length Gait  velocity: decr Gait velocity interpretation: <1.31 ft/sec, indicative of household ambulator   General Gait Details: Supervision for Media planner    Modified Rankin (Stroke Patients Only)       Balance Overall balance assessment: Needs assistance Sitting-balance support: Feet supported Sitting balance-Leahy Scale: Good     Standing balance support: No upper extremity supported, Bilateral upper extremity supported, During functional activity Standing balance-Leahy Scale: Fair                              Cognition Arousal/Alertness: Awake/alert Behavior During Therapy: WFL for tasks assessed/performed Overall Cognitive Status: Within Functional Limits for tasks assessed                                          Exercises      General Comments General comments (skin integrity, edema, etc.): VSS on 6L with amb (portable tank doesn't have 5L setting), SpO2 94% at end of amb      Pertinent Vitals/Pain      Home Living                          Prior Function            PT Goals (current goals can now be found in the care plan section) Acute Rehab PT Goals Patient Stated Goal: to get better Progress towards PT goals: Progressing toward goals  Frequency    Min 3X/week      PT Plan Current plan remains appropriate    Co-evaluation              AM-PAC PT "6 Clicks" Mobility   Outcome Measure  Help needed turning from your back to your side while in a flat bed without using bedrails?: None Help needed moving from lying on your back to sitting on the side of a flat bed without using bedrails?: None Help needed moving to and from a bed to a chair (including a wheelchair)?: A Little Help needed standing up from a chair using your arms (e.g., wheelchair or bedside chair)?: None Help needed to walk in hospital room?: A Little Help needed climbing 3-5 steps with a  railing? : A Little 6 Click Score: 21    End of Session Equipment Utilized During Treatment: Oxygen Activity Tolerance: Patient tolerated treatment well Patient left: in bed;with call bell/phone within reach Nurse Communication: Other (comment) (notified nurse secretary that pt asking for respiratory for a breathing treatment) PT Visit Diagnosis: Unsteadiness on feet (R26.81);Other abnormalities of gait and mobility (R26.89)     Time: 1310-1331 PT Time Calculation (min) (ACUTE ONLY): 21 min  Charges:  $Gait Training: 8-22 mins                     [facility] PT Acute Rehabilitation Services Office [phone]    Angelina Ok [facility] 05/01/2022, 2:43 PM

## 2022-05-01 NOTE — Telephone Encounter (Signed)
Front desk Dr Vassie Loll patient admitted with AECOPD due to RSV and sinus. Need app followup in 1-2 weeks

## 2022-05-01 NOTE — TOC Initial Note (Signed)
Transition of Care [facility]) - Initial/Assessment Note    Patient Details  Name: Kathleen [name] MRN: [id number] Date of Birth: [birth year]  [facility] CM/SW Contact:    Kermit Balo, RN Phone Number: 05/01/2022, 4:03 PM  Clinical Narrative:                 Pt lives at [facility]. She is active with [facility] for home health services. CM has updated Kandee Keen with [place] on admission. Pt is on 4L [place] at home through [facility].  Pt manages her own medications at home without any issues.  Either [place] or her son provides needed transportation. TOC following.  Expected Discharge Plan: Home w Home Health Services Barriers to Discharge: Continued Medical Work up   Patient Goals and CMS Choice   CMS Medicare.gov Compare Post Acute Care list provided to:: Patient Choice offered to / list presented to : Patient  Expected Discharge Plan and Services Expected Discharge Plan: Home w Home Health Services   Discharge Planning Services: CM Consult Post Acute Care Choice: Home Health Living arrangements for the past 2 months: Apartment                           HH Arranged: PT HH Agency: [facility] Date [facility] Agency Contacted: 05/01/22   Representative spoke with at [facility] Agency: Kandee Keen  Prior Living Arrangements/Services Living arrangements for the past 2 months: Apartment Lives with:: Self Patient language and need for interpreter reviewed:: Yes            Current home services: DME (oxygen/ nebulizer/ cane/ walker/ rollator) Criminal Activity/Legal Involvement Pertinent to Current Situation/Hospitalization: No - Comment as needed  Activities of Daily Living Home Assistive Devices/Equipment: Environmental consultant (specify type) (rollator) ADL Screening (condition at time of admission) Patient's cognitive ability adequate to safely complete daily activities?: Yes Is the patient deaf or have difficulty hearing?: No Does the patient have difficulty seeing, even when wearing  glasses/contacts?: No Does the patient have difficulty concentrating, remembering, or making decisions?: No Patient able to express need for assistance with ADLs?: Yes Does the patient have difficulty dressing or bathing?: Yes Independently performs ADLs?: No Communication: Independent Dressing (OT): Independent with device (comment) Grooming: Independent Feeding: Independent Bathing: Independent with device (comment) Toileting: Needs assistance Is this a change from baseline?: Pre-admission baseline In/Out Bed: Needs assistance Is this a change from baseline?: Pre-admission baseline Walks in Home: Independent with device (comment) Does the patient have difficulty walking or climbing stairs?: Yes Weakness of Legs: Both Weakness of Arms/Hands: None  Permission Sought/Granted                  Emotional Assessment Appearance:: Appears stated age Attitude/Demeanor/Rapport: Engaged Affect (typically observed): Accepting Orientation: : Oriented to Self, Oriented to Place, Oriented to  Time, Oriented to Situation   Psych Involvement: No (comment)  Admission diagnosis:  Pneumonia [J18.9] COPD exacerbation (HCC) [J44.1] CAP (community acquired pneumonia) [J18.9] HCAP (healthcare-associated pneumonia) [J18.9] Patient Active Problem List   Diagnosis Date Noted   Pneumonia 04/29/2022   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   Depression, major, single episode, moderate (HCC) 04/27/2022   CAP (community acquired pneumonia) 04/21/2022   Hospital discharge follow-up 03/25/2022   History of severe sepsis 03/25/2022   Acute on chronic combined systolic and diastolic CHF (congestive heart failure) (HCC) 03/19/2022   Non-STEMI (non-ST elevated myocardial infarction) (HCC) 03/18/2022   Hypotension 03/18/2022   Morbid obesity (HCC) 03/18/2022  Severe sepsis (HCC) 03/17/2022   Acute conjunctivitis of right eye 03/03/2022   COPD with acute exacerbation (HCC) 12/03/2021   Low TSH level  12/02/2021   Acute idiopathic gout of left hand 11/19/2021   Acquired hypothyroidism 11/12/2021   Encounter for palliative care involving management of pain 11/12/2021   Post concussive syndrome 11/11/2021   Chronic left-sided low back pain 11/04/2021   Gout 10/01/2021   Hyperlipidemia with target LDL less than 100 08/07/2021   Chronic anticoagulation 07/07/2021   Stage 3a chronic kidney disease (HCC) 05/23/2021   Dietary iron deficiency 05/23/2021   Age-related osteoporosis without current pathological fracture 04/22/2021   Type 2 diabetes mellitus with diabetic neuropathy, without long-term current use of insulin (HCC) 04/22/2021   Sleep apnea 01/22/2021   Statin intolerance 10/12/2019   Statin myopathy 02/24/2019   Chronic systolic heart failure (HCC) 04/06/2018   HTN (hypertension) 04/06/2018   Atrial fibrillation (HCC) 04/06/2018   Chronic respiratory failure with hypoxia (HCC) 03/28/2018   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

## 2022-05-01 NOTE — Progress Notes (Signed)
Kathleen [name]  DHR:[id number] DOB: 04-27-1946 DOA: 04/28/2022 PCP: [name], MD    Brief Narrative:  76 year old with a history of atrial fibrillation, BOOP, tracheobronchomalacia, COPD, OSA on BiPAP, pacemaker, and chronic CHF who presented to the ER with shortness of breath.  She was seen by her Pulmonologist 11/14 and again 11/21 AM and despite antibiotic therapy her symptoms progressed.  Consultants:  None  Goals of Care:  Code Status: Full Code   DVT prophylaxis: Xarelto  Interim Hx: Afebrile.  Vital signs stable.  Now on 5 L nasal cannula support, versus her home baseline of 4 L.  Therapy has seen the patient and reports she is appropriate for home health physical therapy.  Appears to be further improved today.  Still feels quite dyspneic and suffering with frequent coughing.  Denies nausea vomiting or abdominal pain.  Assessment & Plan:  RSV pneumonia/URI - BOOP COVID and influenza negative - prednisone added by Pulmonary - her respiratory viral panel returned positive for RSV, and clinically this is consistent with her course/presentation - continue supportive care as suggested by Pulmonary   Chronic sinusitis Sinus CT noted diffuse paranasal sinus disease - needs to follow-up with ENT as an outpatient - saline irrigation added   Tracheobronchomalacia - OSA Continue nightly BiPAP -poorly compliant with BiPAP at home - recommendations as per Pulmonary  COPD with acute exacerbation Requires 4L [place] support at baseline at home - wean back towards baseline as able  Chronic combined systolic and diastolic CHF without acute exacerbation Currently well compensated -monitor weights and ins and outs  Filed Weights   04/28/22 1132  Weight: 83 kg    HTN Blood pressure reasonably controlled -follow without change today  Chronic atrial fibrillation Continue Tikosyn and Xarelto -heart rate controlled at present  DM Diet controlled/does not require home medication -  monitor CBG while on steroid  CKD stage IIIa Baseline creatinine appears to be approximately 0.8 -creatinine ever so slightly elevated at admission at 1.0, and has now returned to her baseline   Family Communication: No family present Disposition: From ILF - anticipate eventual discharge to same ILF w/ HHPT   Objective: Blood pressure 138/75, pulse 74, temperature 97.6 F (36.4 C), temperature source Oral, resp. rate 18, height 5\' 1"  (1.549 m), weight 83 kg, SpO2 99 %.  Intake/Output Summary (Last 24 hours) at 05/01/2022 1024 Last data filed at 05/01/2022 0739 Gross per 24 hour  Intake 4 ml  Output --  Net 4 ml    Filed Weights   04/28/22 1132  Weight: 83 kg    Examination: General: No acute respiratory distress Lungs: Poor air movement bilaterally -no focal crackles or wheezing Cardiovascular: Regular rate without murmur gallop or rub  Abdomen: Nontender, nondistended, soft, bowel sounds positive Extremities: No significant edema B LE  CBC: Recent Labs  Lab 04/28/22 1200 04/29/22 0500 04/30/22 0158 05/01/22 0229  WBC 13.7* 16.5* 14.6* 13.5*  NEUTROABS 11.8*  --   --   --   HGB 13.4 12.1 12.2 11.7*  HCT 41.3 39.0 38.7 37.0  MCV 83.6 85.9 85.2 85.8  PLT 281 281 248 241    Basic Metabolic Panel: Recent Labs  Lab 04/29/22 0500 04/30/22 0158 05/01/22 0229  NA 138 137 139  K 4.3 4.7 4.8  CL 100 103 101  CO2 25 21* 28  GLUCOSE 132* 144* 136*  BUN 19 22 25*  CREATININE 0.88 0.76 1.00  CALCIUM 9.7 9.5 9.9  MG 2.2  --  2.3    GFR: Estimated Creatinine Clearance: 47.5 mL/min (by C-G formula based on SCr of 1 mg/dL).   Scheduled Meds:  azelastine  2 spray Each Nare BID   docusate sodium  100 mg Oral BID   dofetilide  250 mcg Oral BID   febuxostat  40 mg Oral QHS   fluticasone  2 spray Each Nare Daily   fluticasone furoate-vilanterol  1 puff Inhalation Daily   insulin aspart  0-9 Units Subcutaneous TID WC   predniSONE  60 mg Oral Q breakfast    revefenacin  175 mcg Nebulization Daily   rivaroxaban  20 mg Oral Q supper   sodium chloride flush  3 mL Intravenous Q12H   sodium chloride HYPERTONIC  4 mL Nebulization TID     LOS: 3 days   Lonia Blood, MD [facility] Office  [phone] Pager - Text Page per Loretha Stapler  If 7PM-7AM, please contact night-coverage per Amion 05/01/2022, 10:24 AM

## 2022-05-02 DIAGNOSIS — J398 Other specified diseases of upper respiratory tract: Secondary | ICD-10-CM | POA: Diagnosis not present

## 2022-05-02 DIAGNOSIS — G4733 Obstructive sleep apnea (adult) (pediatric): Secondary | ICD-10-CM | POA: Diagnosis not present

## 2022-05-02 DIAGNOSIS — J181 Lobar pneumonia, unspecified organism: Secondary | ICD-10-CM | POA: Diagnosis not present

## 2022-05-02 LAB — GLUCOSE, CAPILLARY
Glucose-Capillary: 282 mg/dL — ABNORMAL HIGH (ref 70–99)
Glucose-Capillary: 170 mg/dL — ABNORMAL HIGH (ref 70–99)
Glucose-Capillary: 173 mg/dL — ABNORMAL HIGH (ref 70–99)
Glucose-Capillary: 196 mg/dL — ABNORMAL HIGH (ref 70–99)

## 2022-05-02 MED ORDER — SPIRONOLACTONE 25 MG PO TABS
25.0000 mg | ORAL_TABLET | Freq: Every day | ORAL | Status: DC
  Administered 2022-05-02 – 2022-05-04 (×3): 25 mg via ORAL
  Filled 2022-05-02 (×3): qty 1

## 2022-05-02 MED ORDER — PREDNISONE 20 MG PO TABS
40.0000 mg | ORAL_TABLET | Freq: Every day | ORAL | Status: DC
  Administered 2022-05-03 – 2022-05-04 (×2): 40 mg via ORAL
  Filled 2022-05-02 (×2): qty 2

## 2022-05-02 MED ORDER — CYANOCOBALAMIN 1000 MCG/ML IJ SOLN
1000.0000 ug | Freq: Once | INTRAMUSCULAR | Status: AC
  Administered 2022-05-02: 1000 ug via INTRAMUSCULAR
  Filled 2022-05-02: qty 1

## 2022-05-02 MED ORDER — TORSEMIDE 20 MG PO TABS
20.0000 mg | ORAL_TABLET | ORAL | Status: DC
  Administered 2022-05-04: 20 mg via ORAL
  Filled 2022-05-02: qty 1

## 2022-05-02 MED ORDER — IPRATROPIUM-ALBUTEROL 0.5-2.5 (3) MG/3ML IN SOLN
3.0000 mL | Freq: Two times a day (BID) | RESPIRATORY_TRACT | Status: DC
  Administered 2022-05-02 – 2022-05-04 (×4): 3 mL via RESPIRATORY_TRACT
  Filled 2022-05-02 (×4): qty 3

## 2022-05-02 NOTE — Progress Notes (Signed)
Kathleen [name]  ZOX:[id number] DOB: 1945-12-11 DOA: 04/28/2022 PCP: [name], MD    Brief Narrative:  76 year old with a history of atrial fibrillation, BOOP, tracheobronchomalacia, COPD, OSA on BiPAP, pacemaker, and chronic CHF who presented to the ER with shortness of breath.  She was seen by her Pulmonologist 11/14 and again 11/21 AM and despite antibiotic therapy her symptoms progressed.  Consultants:  None  Goals of Care:  Code Status: Full Code   DVT prophylaxis: Xarelto  Interim Hx: No acute events reported overnight.  Patient has noted return of some swelling in her ankles and feet in the absence of her usual home diuretic.  Continues to require 5 L nasal cannula support, compared to her baseline 4 L support.  Still reports feeling very weak and dyspneic with exertion and feels very uncomfortable with the idea of returning to independent living today.  Assessment & Plan:  RSV pneumonia/URI - BOOP COVID and influenza negative - prednisone added by Pulmonary - her respiratory viral panel returned positive for RSV, and clinically this is consistent with her course/presentation - continue supportive care as suggested by Pulmonary   Chronic sinusitis Sinus CT noted diffuse paranasal sinus disease - needs to follow-up with ENT as an outpatient - saline irrigation added   Tracheobronchomalacia - OSA Continue nightly BiPAP - poorly compliant with BiPAP at home - recommendations as per Pulmonary -counseled patient on need to strictly adhere to nightly BiPAP use and how failure to do so will contribute to her respiratory symptoms even during the day  COPD with acute exacerbation Requires 4L [place] support at baseline at home - wean back towards baseline as able  Chronic combined systolic and diastolic CHF without acute exacerbation Very mildly volume overloaded on exam today - resume usual diuretic doses today and follow  HTN Blood pressure reasonably controlled -follow with  resumption of diuretic  Chronic atrial fibrillation Continue Tikosyn and Xarelto -heart rate controlled at present  DM Diet controlled/does not require home medication - monitor CBG while on steroid  CKD stage IIIa Baseline creatinine appears to be approximately 0.8 -creatinine ever so slightly elevated at admission at 1.0, and has now returned to her baseline   Family Communication: No family present Disposition: From ILF - anticipate eventual discharge to same ILF w/ HHPT once exertional dyspnea improved   Objective: Blood pressure (!) 147/78, pulse 71, temperature 97.7 F (36.5 C), temperature source Oral, resp. rate 18, height 5\' 1"  (1.549 m), weight 83 kg, SpO2 90 %.  Intake/Output Summary (Last 24 hours) at 05/02/2022 1024 Last data filed at 05/01/2022 1602 Gross per 24 hour  Intake --  Output 1 ml  Net -1 ml    Filed Weights   04/28/22 1132  Weight: 83 kg    Examination: General: No acute respiratory distress at rest Lungs: Poor air movement bilaterally without significant change in exam from yesterday Cardiovascular: Regular rate without murmur gallop or rub  Abdomen: Nontender, nondistended, soft, bowel sounds positive Extremities: Trace bilateral lower extremity edema  CBC: Recent Labs  Lab 04/28/22 1200 04/29/22 0500 04/30/22 0158 05/01/22 0229  WBC 13.7* 16.5* 14.6* 13.5*  NEUTROABS 11.8*  --   --   --   HGB 13.4 12.1 12.2 11.7*  HCT 41.3 39.0 38.7 37.0  MCV 83.6 85.9 85.2 85.8  PLT 281 281 248 241    Basic Metabolic Panel: Recent Labs  Lab 04/29/22 0500 04/30/22 0158 05/01/22 0229  NA 138 137 139  K 4.3 4.7  4.8  CL 100 103 101  CO2 25 21* 28  GLUCOSE 132* 144* 136*  BUN 19 22 25*  CREATININE 0.88 0.76 1.00  CALCIUM 9.7 9.5 9.9  MG 2.2  --  2.3    GFR: Estimated Creatinine Clearance: 47.5 mL/min (by C-G formula based on SCr of 1 mg/dL).   Scheduled Meds:  azelastine  2 spray Each Nare BID   chlorpheniramine-HYDROcodone  5 mL  Oral Q12H   docusate sodium  100 mg Oral BID   dofetilide  250 mcg Oral BID   febuxostat  40 mg Oral QHS   fluticasone  2 spray Each Nare Daily   fluticasone furoate-vilanterol  1 puff Inhalation Daily   guaiFENesin  1,200 mg Oral BID   insulin aspart  0-9 Units Subcutaneous TID WC   ipratropium-albuterol  3 mL Nebulization BID   predniSONE  60 mg Oral Q breakfast   revefenacin  175 mcg Nebulization Daily   rivaroxaban  20 mg Oral Q supper   sodium chloride flush  3 mL Intravenous Q12H   sodium chloride HYPERTONIC  4 mL Nebulization TID   spironolactone  25 mg Oral Daily   torsemide  20 mg Oral Once per day on Mon Thu     LOS: 4 days   Lonia Blood, MD [facility] Office  [phone] Pager - Text Page per Loretha Stapler  If 7PM-7AM, please contact night-coverage per Amion 05/02/2022, 10:24 AM

## 2022-05-02 NOTE — Progress Notes (Signed)
Pt now requesting pain meds before placing BIPAP on.  RN notified, pt to call when ready for BIPAP.

## 2022-05-02 NOTE — Progress Notes (Addendum)
Pt brought in home BIPAP machine.  RT inspected for damages and defects, none were found.  Pt requesting assistance getting BIPAP mask on around 2300.

## 2022-05-02 NOTE — Progress Notes (Signed)
Occupational Therapy Treatment Patient Details Name: Kathleen [name] MRN: [id number] DOB: Sep 02, 1945 Today's Date: 05/02/2022   History of present illness 76 y.o. female with presenting 04/28/22 with SOB and cough. She was seen by her Pulmonologist 11/14 and again 11/21 AM and despite antibiotic therapy her symptoms progressed. Pt with nonresolving RML pneumonia - BOOP. Pt +RSV. PMH: afib, BOOP, chronic combined CHF, tracheobronchomalacia, COPD, HTN, OSA on BIPAP, and pacemaker placement   OT comments  Pt. Seen for skilled OT treatment session.  Pt. States she is "exhausted" for eob sitting and online c.mas shopping.  Eob lb dressing task and education on energy conservation. Pt. Able to site examples of energy conservation she was using prior to admit.  Pt. Able to initiate rest breaks when needed. Some cueing not to continue conversation when she was sob or began coughing.  Education on edema management as she noted both ankles with edema.  Assisted with getting b les elevated in bed and adjusting socks to reduce indention.  Pt. Receptive and good an implementation.     Recommendations for follow up therapy are one component of a multi-disciplinary discharge planning process, led by the attending physician.  Recommendations may be updated based on patient status, additional functional criteria and insurance authorization.    Follow Up Recommendations  No OT follow up     Assistance Recommended at Discharge Intermittent Supervision/Assistance  Patient can return home with the following  Assist for transportation;Assistance with cooking/housework   Equipment Recommendations  None recommended by OT    Recommendations for Other Services      Precautions / Restrictions Precautions Precautions: Fall Precaution Comments: Increasing O2 need (4-5L baseline).  Watch HR       Mobility Bed Mobility Overal bed mobility: Modified Independent             General bed mobility comments: Pt  sitting EOB at beginning of session but able to bring b les into bed at end of session without physical assist    Transfers                         Balance                                           ADL either performed or assessed with clinical judgement   ADL Overall ADL's : Needs assistance/impaired                     Lower Body Dressing: Min guard;Sitting/lateral leans Lower Body Dressing Details (indicate cue type and reason): don/doff socks               General ADL Comments: pt. stating that she is very tired. session limited to eob and in bed today.  also noted bles are presenting with edema.  educated on benefits and need for elevation.  also to check indentions from sock and adjust as needed to prevent skin issues.   reviewed and discussed energy conservation strategies.  pt. very aware and good at implementing and following energy conservation prior to admit.    Extremity/Trunk Assessment              Vision       Perception     Praxis      Cognition Arousal/Alertness: Awake/alert Behavior During Therapy: WFL for tasks assessed/performed Overall Cognitive  Status: Within Functional Limits for tasks assessed                                          Exercises      Shoulder Instructions       General Comments      Pertinent Vitals/ Pain       Pain Assessment Pain Assessment: No/denies pain  Home Living                                          Prior Functioning/Environment              Frequency  Min 2X/week        Progress Toward Goals  OT Goals(current goals can now be found in the care plan section)  Progress towards OT goals: Progressing toward goals     Plan Discharge plan remains appropriate    Co-evaluation                 AM-PAC OT "6 Clicks" Daily Activity     Outcome Measure   Help from another person eating meals?: None Help from  another person taking care of personal grooming?: A Little Help from another person toileting, which includes using toliet, bedpan, or urinal?: A Little Help from another person bathing (including washing, rinsing, drying)?: A Little Help from another person to put on and taking off regular upper body clothing?: None Help from another person to put on and taking off regular lower body clothing?: A Little 6 Click Score: 20    End of Session    OT Visit Diagnosis: Unsteadiness on feet (R26.81);History of falling (Z91.81);Muscle weakness (generalized) (M62.81)   Activity Tolerance Patient limited by fatigue   Patient Left in bed;with call bell/phone within reach;with bed alarm set   Nurse Communication          Time: 3235-5732 OT Time Calculation (min): 17 min  Charges: OT General Charges $OT Visit: 1 Visit OT Treatments $Self Care/Home Management : 8-22 mins  Boneta Lucks, COTA/L Acute Rehabilitation [phone]   Alessandra Bevels Lorraine-COTA/L 05/02/2022, 11:52 AM

## 2022-05-03 DIAGNOSIS — J441 Chronic obstructive pulmonary disease with (acute) exacerbation: Secondary | ICD-10-CM | POA: Diagnosis not present

## 2022-05-03 DIAGNOSIS — I10 Essential (primary) hypertension: Secondary | ICD-10-CM

## 2022-05-03 DIAGNOSIS — J189 Pneumonia, unspecified organism: Secondary | ICD-10-CM | POA: Diagnosis not present

## 2022-05-03 DIAGNOSIS — I480 Paroxysmal atrial fibrillation: Secondary | ICD-10-CM | POA: Diagnosis not present

## 2022-05-03 DIAGNOSIS — E1140 Type 2 diabetes mellitus with diabetic neuropathy, unspecified: Secondary | ICD-10-CM

## 2022-05-03 LAB — BASIC METABOLIC PANEL
Anion gap: 9 (ref 5–15)
BUN: 22 mg/dL (ref 8–23)
CO2: 28 mmol/L (ref 22–32)
Calcium: 10.4 mg/dL — ABNORMAL HIGH (ref 8.9–10.3)
Chloride: 100 mmol/L (ref 98–111)
Creatinine, Ser: 0.66 mg/dL (ref 0.44–1.00)
GFR, Estimated: >60 mL/min (ref >60)
Glucose, Bld: 118 mg/dL — ABNORMAL HIGH (ref 70–99)
Potassium: 4.4 mmol/L (ref 3.5–5.1)
Sodium: 137 mmol/L (ref 135–145)

## 2022-05-03 LAB — CULTURE, BLOOD (ROUTINE X 2)
Culture: NO GROWTH
Culture: NO GROWTH

## 2022-05-03 LAB — GLUCOSE, CAPILLARY
Glucose-Capillary: 155 mg/dL — ABNORMAL HIGH (ref 70–99)
Glucose-Capillary: 282 mg/dL — ABNORMAL HIGH (ref 70–99)
Glucose-Capillary: 174 mg/dL — ABNORMAL HIGH (ref 70–99)
Glucose-Capillary: 181 mg/dL — ABNORMAL HIGH (ref 70–99)

## 2022-05-03 LAB — MAGNESIUM: Magnesium: 2 mg/dL (ref 1.7–2.4)

## 2022-05-03 MED ORDER — FUROSEMIDE 10 MG/ML IJ SOLN
40.0000 mg | Freq: Once | INTRAMUSCULAR | Status: AC
  Administered 2022-05-03: 40 mg via INTRAVENOUS
  Filled 2022-05-03: qty 4

## 2022-05-03 NOTE — Progress Notes (Signed)
Kathleen [name]  GGE:[id number] DOB: 1946/05/07 DOA: 04/28/2022 PCP: [name], MD    Brief Narrative:  76 year old with a history of atrial fibrillation, BOOP, tracheobronchomalacia, COPD, OSA on BiPAP, pacemaker, and chronic CHF who presented to the ER with shortness of breath.  She was seen by her Pulmonologist 11/14 and again 11/21 AM and despite antibiotic therapy her symptoms progressed.  Significant studies 11/21>> COVID influenza PCR: Negative 11/21>> respiratory virus panel: Positive for RSV 11/21>> blood culture: No growth 11/21>> CXR: Improvement in right lower lobe airspace disease. 11/21>> CT maxillofacial: Diffuse paranasal sinus disease  Consultants:  PCCM  Goals of Care:  Code Status: Full Code   DVT prophylaxis: Xarelto  Interim Hx: Continues to cough-understands that she will be coughing for the next several days/few weeks. Concerned about discharge today, as she has an outpatient echo scheduled for tomorrow-and does not want to come back to the hospital right away after discharge.  Assessment & Plan: RSV pneumonia/URI - BOOP Overall stable Tolerating 4-5 L of oxygen Lungs are mostly clear-some transmitted upper airway sounds Continue bronchodilators/long prednisone taper Pulmonary will arrange for outpatient follow-up  Chronic sinusitis Sinus CT noted diffuse paranasal sinus disease - needs to follow-up with ENT as an outpatient - saline irrigation added   Tracheobronchomalacia - OSA Continue nightly BiPAP - poorly compliant with BiPAP at home - recommendations as per Pulmonary -counseled patient on need to strictly adhere to nightly BiPAP use and how failure to do so will contribute to her respiratory symptoms even during the day  COPD with acute exacerbation Much improved  Moving air well-mostly transmitted upper airway sounds  Requiring around 4-5 L of oxygen (4 L at baseline)   Chronic combined systolic and diastolic CHF without acute  exacerbation Mild lower extremity edema  1 dose of IV Lasix today  Continue daily Aldactone  Back on her usual regimen of torsemide twice weekly-starting tomorrow.  HTN BP stable on diuretic regimen.    Chronic atrial fibrillation Continue Tikosyn and Xarelto -heart rate controlled at present  DM Diet controlled/does not require home medication - monitor CBG while on steroid  CKD stage IIIa Baseline creatinine appears to be approximately 0.8 -creatinine ever so slightly elevated at admission at 1.0, and has now returned to her baseline  Family Communication: No family present Disposition: From ILF - anticipate eventual discharge to same ILF w/ HHPT hopefully tomorrow.   Objective: Blood pressure (!) 154/54, pulse 77, temperature 97.8 F (36.6 C), temperature source Oral, resp. rate 20, height 5\' 1"  (1.549 m), weight 83 kg, SpO2 95 %.  Intake/Output Summary (Last 24 hours) at 05/03/2022 0905 Last data filed at 05/02/2022 2350 Gross per 24 hour  Intake --  Output 3 ml  Net -3 ml    Filed Weights   04/28/22 1132  Weight: 83 kg    Examination: General: No acute respiratory distress at rest Lungs: Poor air movement bilaterally without significant change in exam from yesterday Cardiovascular: Regular rate without murmur gallop or rub  Abdomen: Nontender, nondistended, soft, bowel sounds positive Extremities: Trace bilateral lower extremity edema  CBC: Recent Labs  Lab 04/28/22 1200 04/29/22 0500 04/30/22 0158 05/01/22 0229  WBC 13.7* 16.5* 14.6* 13.5*  NEUTROABS 11.8*  --   --   --   HGB 13.4 12.1 12.2 11.7*  HCT 41.3 39.0 38.7 37.0  MCV 83.6 85.9 85.2 85.8  PLT 281 281 248 241    Basic Metabolic Panel: Recent Labs  Lab 04/29/22 0500  04/30/22 0158 05/01/22 0229 05/03/22 0303  NA 138 137 139 137  K 4.3 4.7 4.8 4.4  CL 100 103 101 100  CO2 25 21* 28 28  GLUCOSE 132* 144* 136* 118*  BUN 19 22 25* 22  CREATININE 0.88 0.76 1.00 0.66  CALCIUM 9.7 9.5 9.9  10.4*  MG 2.2  --  2.3 2.0    GFR: Estimated Creatinine Clearance: 59.4 mL/min (by C-G formula based on SCr of 0.66 mg/dL).   Scheduled Meds:  azelastine  2 spray Each Nare BID   chlorpheniramine-HYDROcodone  5 mL Oral Q12H   docusate sodium  100 mg Oral BID   dofetilide  250 mcg Oral BID   febuxostat  40 mg Oral QHS   fluticasone  2 spray Each Nare Daily   fluticasone furoate-vilanterol  1 puff Inhalation Daily   guaiFENesin  1,200 mg Oral BID   insulin aspart  0-9 Units Subcutaneous TID WC   ipratropium-albuterol  3 mL Nebulization BID   predniSONE  40 mg Oral Q breakfast   revefenacin  175 mcg Nebulization Daily   rivaroxaban  20 mg Oral Q supper   sodium chloride flush  3 mL Intravenous Q12H   sodium chloride HYPERTONIC  4 mL Nebulization TID   spironolactone  25 mg Oral Daily   [START ON 05/04/2022] torsemide  20 mg Oral Once per day on Mon Fri     LOS: 5 days   Lonia Blood, MD [facility] Office  [phone] Pager - Text Page per Loretha Stapler  If 7PM-7AM, please contact night-coverage per Amion 05/03/2022, 9:05 AM

## 2022-05-04 ENCOUNTER — Other Ambulatory Visit (HOSPITAL_COMMUNITY): Payer: Self-pay

## 2022-05-04 ENCOUNTER — Ambulatory Visit (HOSPITAL_COMMUNITY): Payer: Medicare Other

## 2022-05-04 ENCOUNTER — Encounter (HOSPITAL_COMMUNITY): Payer: Medicare Other

## 2022-05-04 ENCOUNTER — Inpatient Hospital Stay (HOSPITAL_COMMUNITY): Payer: Medicare Other

## 2022-05-04 DIAGNOSIS — I10 Essential (primary) hypertension: Secondary | ICD-10-CM | POA: Diagnosis not present

## 2022-05-04 DIAGNOSIS — I5031 Acute diastolic (congestive) heart failure: Secondary | ICD-10-CM

## 2022-05-04 DIAGNOSIS — J441 Chronic obstructive pulmonary disease with (acute) exacerbation: Secondary | ICD-10-CM | POA: Diagnosis not present

## 2022-05-04 DIAGNOSIS — E785 Hyperlipidemia, unspecified: Secondary | ICD-10-CM | POA: Diagnosis not present

## 2022-05-04 DIAGNOSIS — J189 Pneumonia, unspecified organism: Secondary | ICD-10-CM | POA: Diagnosis not present

## 2022-05-04 LAB — ECHOCARDIOGRAM COMPLETE
Height: 61 in
Weight: 2927.71 oz

## 2022-05-04 LAB — GLUCOSE, CAPILLARY
Glucose-Capillary: 194 mg/dL — ABNORMAL HIGH (ref 70–99)
Glucose-Capillary: 197 mg/dL — ABNORMAL HIGH (ref 70–99)

## 2022-05-04 MED ORDER — HYDROCOD POLI-CHLORPHE POLI ER 10-8 MG/5ML PO SUER
5.0000 mL | Freq: Two times a day (BID) | ORAL | 0 refills | Status: DC
  Filled 2022-05-04: qty 70, 7d supply, fill #0

## 2022-05-04 MED ORDER — IPRATROPIUM-ALBUTEROL 0.5-2.5 (3) MG/3ML IN SOLN
3.0000 mL | RESPIRATORY_TRACT | 1 refills | Status: DC | PRN
  Filled 2022-05-04: qty 90, 5d supply, fill #0

## 2022-05-04 MED ORDER — BENZONATATE 200 MG PO CAPS
200.0000 mg | ORAL_CAPSULE | Freq: Three times a day (TID) | ORAL | 0 refills | Status: DC | PRN
  Filled 2022-05-04: qty 20, 7d supply, fill #0

## 2022-05-04 MED ORDER — GUAIFENESIN ER 600 MG PO TB12
1200.0000 mg | ORAL_TABLET | Freq: Two times a day (BID) | ORAL | 0 refills | Status: AC
  Filled 2022-05-04: qty 28, 7d supply, fill #0

## 2022-05-04 MED ORDER — PERFLUTREN LIPID MICROSPHERE
1.0000 mL | INTRAVENOUS | Status: AC | PRN
  Administered 2022-05-04: 4 mL via INTRAVENOUS

## 2022-05-04 MED ORDER — PREDNISONE 10 MG PO TABS
ORAL_TABLET | ORAL | 0 refills | Status: DC
  Filled 2022-05-04: qty 50, 20d supply, fill #0

## 2022-05-04 NOTE — Progress Notes (Signed)
  Echocardiogram 2D Echocardiogram has been performed.  Maren Reamer 05/04/2022, 11:39 AM

## 2022-05-04 NOTE — Telephone Encounter (Signed)
Patient is scheduled to see Rhunette Croft, NP on 05/19/2022.

## 2022-05-04 NOTE — TOC Transition Note (Signed)
Transition of Care [facility]) - CM/SW Discharge Note   Patient Details  Name: Kathleen [name] MRN: [id number] Date of Birth: 1945-11-06  [facility] CM/SW Contact:  Harriet Masson, RN Phone Number: 05/04/2022, 3:38 PM   Clinical Narrative:     Patient stable for discharge.  Cory with [facility] notified of discharge.  No other TOC needs at this time.  Final next level of care: Home w Home Health Services Barriers to Discharge: Barriers Resolved   Patient Goals and CMS Choice Patient states their goals for this hospitalization and ongoing recovery are:: return home CMS Medicare.gov Compare Post Acute Care list provided to:: Patient Choice offered to / list presented to : Patient  Discharge Placement               home        Discharge Plan and Services   Discharge Planning Services: CM Consult Post Acute Care Choice: Home Health                    HH Arranged: PT [facility] Agency: [facility] Date [facility] Agency Contacted: 05/01/22   Representative spoke with at [facility] Agency: Kandee Keen  Social Determinants of Health (SDOH) Interventions     Readmission Risk Interventions    05/04/2022    3:37 PM  Readmission Risk Prevention Plan  Transportation Screening Complete  PCP or Specialist Appt within 5-7 Days Complete  Home Care Screening Complete  Medication Review (RN CM) Complete

## 2022-05-04 NOTE — Discharge Summary (Signed)
PATIENT DETAILS Name: Kathleen [name] Age: 76 y.o. Sex: female Date of Birth: 1946/05/28 MRN: [id number]. Admitting Physician: Lonia Blood, MD [id number], Bernadene Bell, MD  Admit Date: 04/28/2022 Discharge date: 05/04/2022  Recommendations for Outpatient Follow-up:  Follow up with PCP in 1-2 weeks Please obtain CMP/CBC in one week Please ensure follow-up with pulmonology-Taper steroids further depending on clinical response during that visit. Consider ENT follow-up-see below  Admitted From:  Home  Disposition: Home health   Discharge Condition: fair  CODE STATUS:   Code Status: Full Code   Diet recommendation:  Diet Order             Diet - low sodium heart healthy           Diet heart healthy/carb modified Room service appropriate? No; Fluid consistency: Thin  Diet effective now                    Brief Summary: 38-year-old with a history of atrial fibrillation, BOOP, tracheobronchomalacia, COPD, OSA on BiPAP, pacemaker, and chronic CHF who presented to the ER with shortness of breath.  She was seen by her Pulmonologist 11/14 and again 11/21 AM and despite antibiotic therapy her symptoms progressed.   Significant studies 11/21>> COVID influenza PCR: Negative 11/21>> respiratory virus panel: Positive for RSV 11/21>> blood culture: No growth 11/21>> CXR: Improvement in right lower lobe airspace disease. 11/21>> CT maxillofacial: Diffuse paranasal sinus disease   Consultants:  PCCM   Goals of Care:  Code Status: Full Code    DVT prophylaxis: Xarelto  Brief Hospital Course: RSV pneumonia/URI - BOOP Overall improved-stable Requiring around 4-5 L of oxygen Continue bronchodilators Slowly taper steroids-PCCM will arrange follow-up in the clinic-await decision to prolong steroid taper can be made.   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   Resume losartan on discharge  Chronic atrial fibrillation Continue Tikosyn and Xarelto -heart rate controlled at present   DM Diet controlled/does not require home medication - monitor CBG while on steroid   CKD stage IIIa Baseline creatinine appears to be approximately 0.8 -creatinine ever so slightly elevated at admission at 1.0, and has now returned to her baseline  Obesity: Estimated body mass index is 34.57 kg/m as calculated from the following:   Height as of this encounter: 5\' 1"  (1.549 m).   Weight as of this encounter: 83 kg.    Discharge Diagnoses:  Principal Problem:   CAP (community acquired pneumonia) Active Problems:   HTN (hypertension)   Atrial fibrillation (HCC)   Sleep apnea   Type 2 diabetes mellitus with diabetic neuropathy, without long-term current use of insulin (HCC)  Stage 3a chronic kidney disease (HCC)   Hyperlipidemia with target LDL less than 100   COPD with acute exacerbation (HCC)   Tracheobronchomalacia   Pneumonia   Discharge Instructions:  Activity:  As tolerated with Full fall precautions use walker/cane & assistance as needed   Discharge Instructions      (HEART FAILURE PATIENTS) Call MD:  Anytime you have any of the following symptoms: 1) 3 pound weight gain in 24 hours or 5 pounds in 1 week 2) shortness of breath, with or without a dry hacking cough 3) swelling in the hands, feet or stomach 4) if you have to sleep on extra pillows at night in order to breathe.   Complete by: As directed    Call MD for:  difficulty breathing, headache or visual disturbances   Complete by: As directed    Diet - low sodium heart healthy   Complete by: As directed    Discharge instructions   Complete by: As directed    Follow with Primary MD  [name], MD in 1-2 weeks  Pulmonology office will call you with a follow-up appointment  Please ask your primary care practitioner regarding outpatient ENT referral for chronic sinusitis  Please get a complete blood count and chemistry panel checked by your Primary MD at your next visit, and again as instructed by your Primary MD.  Get Medicines reviewed and adjusted: Please take all your medications with you for your next visit with your Primary MD  Laboratory/radiological data: Please request your Primary MD to go over all hospital tests and procedure/radiological results at the follow up, please ask your Primary MD to get all Hospital records sent to his/her office.  In some cases, they will be blood work, cultures and biopsy results pending at the time of your discharge. Please request that your primary care M.D. follows up on these results.  Also Note the following: If you experience worsening of your admission symptoms, develop shortness of breath, life threatening emergency, suicidal or homicidal thoughts you must seek medical attention immediately by calling 911 or calling your MD immediately  if symptoms less severe.  You must read complete instructions/literature along with all the possible adverse reactions/side effects for all the Medicines you take and that have been prescribed to you. Take any new  Medicines after you have completely understood and accpet all the possible adverse reactions/side effects.   Do not drive when taking Pain medications or sleeping medications (Benzodaizepines)  Do not take more than prescribed Pain, Sleep and Anxiety Medications. It is not advisable to combine anxiety,sleep and pain medications without talking with your primary care practitioner  Special Instructions: If you have smoked or chewed Tobacco  in the last 2 yrs please stop smoking, stop any regular Alcohol  and or any Recreational drug use.  Wear Seat belts while driving.  Please note: You were cared for by a hospitalist during your hospital stay. Once you are discharged, your primary care physician will handle any further medical issues. Please note that NO REFILLS for any discharge medications will be authorized once you are discharged, as it is imperative that you return to your primary care physician (or establish a relationship with a primary care physician if you do not have one) for your post hospital discharge needs so that they can reassess your need for medications and monitor your lab values.   Increase activity slowly   Complete by: As directed       Allergies as of  05/04/2022       Reactions   Allopurinol Other (See Comments)   Reaction:  Dizziness    Clindamycin Anaphylaxis, Hives   Flublok [influenza Vaccine Recombinant] Other (See Comments)   Fever 103 with no alternative explanation day after vaccine.  Santiago Glad Highfill FNP-C   Pneumococcal 13-val Conj Vacc Itching, Swelling, Rash   Dronedarone Rash   Brovana [arformoterol]    Caused muscle pain   Budesonide    Caused extreme joint pain   Entresto [sacubitril-valsartan] Other (See Comments)   hypotension   Fosamax [alendronate Sodium] Nausea Only   Jardiance [empagliflozin]    Caused a vaginal infection   Meperidine Nausea And Vomiting   Microplegia Msa-msg [plegisol]    Loopy,diarrhea   Rosuvastatin Other (See  Comments)   Reaction:  Muscle spasms    Tetracycline Hives   Adhesive [tape] Rash   Lovastatin Rash, Other (See Comments)   Muscle Pain        Medication List     STOP taking these medications    cefpodoxime 200 MG tablet Commonly known as: VANTIN       TAKE these medications    acetaminophen 650 MG CR tablet Commonly known as: TYLENOL Take 1,300 mg by mouth every 8 (eight) hours as needed for pain.   albuterol 108 (90 Base) MCG/ACT inhaler Commonly known as: VENTOLIN HFA Inhale 2 puffs into the lungs every 4 (four) hours as needed for wheezing or shortness of breath. What changed: Another medication with the same name was removed. Continue taking this medication, and follow the directions you see here.   azelastine 0.1 % nasal spray Commonly known as: ASTELIN Place 2 sprays into both nostrils 2 (two) times daily.   benzonatate 200 MG capsule Commonly known as: TESSALON Take 1 capsule (200 mg total) by mouth 3 (three) times daily as needed for cough.   chlorpheniramine-HYDROcodone 10-8 MG/5ML Commonly known as: TUSSIONEX Take 5 mLs by mouth every 12 (twelve) hours.   cyanocobalamin 1000 MCG/ML injection Commonly known as: VITAMIN B12 Inject 1 mL (1,000 mcg total) into the muscle every 30 (thirty) days.   docusate sodium 100 MG capsule Commonly known as: COLACE Take 100 mg by mouth 2 (two) times daily.   dofetilide 250 MCG capsule Commonly known as: TIKOSYN Take 1 capsule (250 mcg total) by mouth in the morning and at bedtime.   febuxostat 40 MG tablet Commonly known as: ULORIC TAKE 1 TABLET(40 MG) BY MOUTH EVERY NIGHT What changed: See the new instructions.   fluticasone furoate-vilanterol 200-25 MCG/ACT Aepb Commonly known as: BREO ELLIPTA INHALE 1 PUFF INTO THE LUNGS DAILY   guaiFENesin 600 MG 12 hr tablet Commonly known as: MUCINEX Take 2 tablets (1,200 mg total) by mouth 2 (two) times daily for 7 days.   ipratropium-albuterol 0.5-2.5 (3) MG/3ML  Soln Commonly known as: DUONEB Take 3 mLs by nebulization every 4 (four) hours as needed.   Linzess 145 MCG Caps capsule Generic drug: linaclotide Take 145 mcg by mouth daily as needed (constipation).   losartan 25 MG tablet Commonly known as: COZAAR TAKE 1 TABLET(25 MG) BY MOUTH AT BEDTIME What changed: See the new instructions.   Magnesium 500 MG Tabs Take 500 mg by mouth at bedtime.   mupirocin ointment 2 % Commonly known as: BACTROBAN Apply 1 application. topically 2 (two) times daily. What changed:  when to take this reasons to take this   Nexletol 180 MG Tabs Generic drug: Bempedoic Acid Take 1 tablet by mouth daily.  nitroGLYCERIN 0.4 MG SL tablet Commonly known as: NITROSTAT Place 0.4 mg under the tongue every 5 (five) minutes as needed for chest pain. Patient has for emergency use   nystatin cream Commonly known as: MYCOSTATIN Apply to affected area 2 times daily What changed:  how much to take how to take this when to take this reasons to take this additional instructions   OXYGEN Inhale 4.5 L into the lungs continuous. Use with resmed ventilator   predniSONE 10 MG tablet Commonly known as: DELTASONE Take 4 tablets (40 mg) daily for 5 days, then 3 tablets (30 mg) daily for 5 days, then, 2 tablets (20 mg) daily for 5 days, then,Take 1 tablets (10 mg) daily for 5 days, then stop   rivaroxaban 20 MG Tabs tablet Commonly known as: XARELTO Take 1 tablet (20 mg total) by mouth daily with supper.   spironolactone 25 MG tablet Commonly known as: ALDACTONE TAKE 1 TABLET(25 MG) BY MOUTH EVERY MORNING What changed: See the new instructions.   torsemide 20 MG tablet Commonly known as: DEMADEX Take 20mg  (1 Tab) Monday and Friday What changed:  how much to take how to take this when to take this   traMADol 50 MG tablet Commonly known as: ULTRAM Take 50 mg by mouth every 6 (six) hours as needed for pain.   Vitamin D3 50 MCG (2000 UT) Tabs Take 2,000  Units by mouth at bedtime.   Yupelri 175 MCG/3ML nebulizer solution Generic drug: revefenacin USE 1 VIAL IN NEBULIZER DAILY - DO NOT MIX WITH OTHER NEB MEDS,USE BEFORE OR AFTER What changed: See the new instructions.        Follow-up Information     [facility], [facility] Follow up.   Specialty: Home Health Services Why: The home health agency will contact you for the next home visit. Contact information: [place] [address] [phone]         [name], MD. Schedule an appointment as soon as possible for a visit in 1 week(s).   Specialty: Internal Medicine Contact information: [place] [address] [phone]         [facility] Follow up.   Specialty: Pulmonology Why: Hospital follow up, Office will call with date/time, If you dont hear from them,please give them a call Contact information: [address] [place] [phone]        Constance Haw, MD Follow up on 05/18/2022.   Specialty: Cardiology Why: appt at 11:30 am Contact information: [address] [place] [address] [phone]         Clayton Bibles, NP Follow up on 05/19/2022.   Specialty: Nurse Practitioner Why: appt at 11:30 Contact information: [address] [place] [address] [phone]                Allergies  Allergen Reactions   Allopurinol Other (See Comments)    Reaction:  Dizziness    Clindamycin Anaphylaxis and Hives   Flublok [Influenza Vaccine Recombinant] Other (See Comments)    Fever 103 with no alternative explanation day after vaccine.  Santiago Glad Highfill FNP-C   Pneumococcal 13-Val Conj Vacc Itching, Swelling and Rash   Dronedarone Rash   Brovana [Arformoterol]     Caused muscle pain   Budesonide     Caused extreme joint pain   Entresto [Sacubitril-Valsartan] Other (See Comments)    hypotension  Fosamax  [Alendronate Sodium] Nausea Only   Jardiance [Empagliflozin]     Caused a vaginal infection   Meperidine Nausea And Vomiting   Microplegia Msa-Msg [Plegisol]     Loopy,diarrhea   Rosuvastatin Other (See Comments)    Reaction:  Muscle spasms    Tetracycline Hives   Adhesive [Tape] Rash   Lovastatin Rash and Other (See Comments)    Muscle Pain     Other Procedures/Studies: CT MAXILLOFACIAL WO CONTRAST  Result Date: 04/28/2022 CLINICAL DATA:  Head trauma, abnormal mental status. Sinusitis. Trauma to left face a nasal passage in the past. EXAM: CT MAXILLOFACIAL WITHOUT CONTRAST TECHNIQUE: Multidetector CT imaging of the maxillofacial structures was performed. Multiplanar CT image reconstructions were also generated. RADIATION DOSE REDUCTION: This exam was performed according to the departmental dose-optimization program which includes automated exposure control, adjustment of the mA and/or kV according to patient size and/or use of iterative reconstruction technique. COMPARISON:  03/20/2022. FINDINGS: Osseous: No fracture or mandibular dislocation. No destructive process. Orbits: Negative. No traumatic or inflammatory finding. Sinuses: There is partial opacification of the right frontal sinus, bilateral ethmoid air cells, bilateral sphenoid and maxillary sinuses. Soft tissues: No significant soft tissue abnormality. Limited intracranial: No acute abnormality.  Atrophy is noted. IMPRESSION: 1. No evidence of acute fracture. 2. Diffuse paranasal sinus disease. Electronically Signed   By: Brett Fairy M.D.   On: 04/28/2022 23:49   DG Chest 2 View  Result Date: 04/28/2022 CLINICAL DATA:  RLL pna; worsening dyspnea/cough despite abx therapy EXAM: CHEST - 2 VIEW COMPARISON:  Radiograph 06/21/2021 FINDINGS: Unchanged cardiomediastinal silhouette and pacemaker leads. Unchanged CardioMEMS device. There is no focal airspace consolidation. Slight improvement in right lower lung airspace disease. Blunted  costophrenic sulci suggesting trace effusions. No evidence of pneumothorax. No acute osseous abnormality. Thoracic spondylosis. IMPRESSION: Slight improvement right lower lung airspace disease. Blunted costophrenic sulci suggesting trace effusions. Electronically Signed   By: Maurine Simmering M.D.   On: 04/28/2022 10:20   DG Chest 2 View  Result Date: 04/21/2022 CLINICAL DATA:  Productive cough EXAM: CHEST - 2 VIEW COMPARISON:  Chest radiograph 03/17/2022, CTA chest 03/18/2022 FINDINGS: The left chest wall cardiac device and associated leads are stable. The cardiomediastinal silhouette is stable with unchanged mild cardiomegaly. Lungs remain hyperinflated with flattening of the diaphragms consistent with underlying COPD. There are patchy opacities in the right lower lobe which are increased from the prior study. There is no other focal airspace disease. There is no pulmonary edema. There is no pleural effusion or pneumothorax There is no acute osseous abnormality. IMPRESSION: Patchy opacities in the right lower lobe suspicious for pneumonia superimposed on background COPD. Recommend follow-up radiographs in 3-4 weeks to ensure resolution. Electronically Signed   By: Valetta Mole M.D.   On: 04/21/2022 12:20     TODAY-DAY OF DISCHARGE:  Subjective:   [facility] today has no headache,no chest abdominal pain,no new weakness tingling or numbness, feels much better wants to go home today.  Objective:   Blood pressure (!) 152/82, pulse 80, temperature 97.9 F (36.6 C), temperature source Oral, resp. rate 18, height 5\' 1"  (1.549 m), weight 83 kg, SpO2 96 %. No intake or output data in the 24 hours ending 05/04/22 1158 Filed Weights   04/28/22 1132  Weight: 83 kg    Exam: Awake Alert, Oriented *3, No new F.N deficits, Normal affect [place].AT,PERRAL Supple Neck,No JVD, No cervical lymphadenopathy appriciated.  Symmetrical Chest wall movement, Good air movement bilaterally, CTAB RRR,No Gallops,Rubs or  new Murmurs, No Parasternal Heave +ve B.Sounds, Abd Soft, Non tender, No organomegaly appriciated, No rebound -guarding or rigidity. No Cyanosis, Clubbing or edema, No new Rash or bruise   PERTINENT RADIOLOGIC STUDIES: No results found.   PERTINENT LAB RESULTS: CBC: No results for input(s): "WBC", "HGB", "HCT", "PLT" in the last 72 hours. CMET CMP     Component Value Date/Time   NA 137 05/03/2022 0303   NA 130 (L) 12/28/2012 0748   K 4.4 05/03/2022 0303   K 4.6 12/28/2012 0748   CL 100 05/03/2022 0303   CL 95 (L) 12/28/2012 0748   CO2 28 05/03/2022 0303   CO2 23 12/28/2012 0748   GLUCOSE 118 (H) 05/03/2022 0303   GLUCOSE 391 (H) 12/28/2012 0748   BUN 22 05/03/2022 0303   BUN 32 (H) 12/28/2012 0748   CREATININE 0.66 05/03/2022 0303   CREATININE 1.29 12/28/2012 0748   CALCIUM 10.4 (H) 05/03/2022 0303   CALCIUM 14.3 (HH) 06/26/2019 1618   PROT 6.6 04/28/2022 1200   ALBUMIN 4.1 04/28/2022 1200   AST 14 (L) 04/28/2022 1200   ALT 14 04/28/2022 1200   ALKPHOS 26 (L) 04/28/2022 1200   BILITOT 0.6 04/28/2022 1200   GFRNONAA >60 05/03/2022 0303   GFRNONAA 43 (L) 12/28/2012 0748   GFRAA 58 (L) 06/28/2019 0427   GFRAA 50 (L) 12/28/2012 0748    GFR Estimated Creatinine Clearance: 59.4 mL/min (by C-G formula based on SCr of 0.66 mg/dL). No results for input(s): "LIPASE", "AMYLASE" in the last 72 hours. No results for input(s): "CKTOTAL", "CKMB", "CKMBINDEX", "TROPONINI" in the last 72 hours. Invalid input(s): "POCBNP" No results for input(s): "DDIMER" in the last 72 hours. No results for input(s): "HGBA1C" in the last 72 hours. No results for input(s): "CHOL", "HDL", "LDLCALC", "TRIG", "CHOLHDL", "LDLDIRECT" in the last 72 hours. No results for input(s): "TSH", "T4TOTAL", "T3FREE", "THYROIDAB" in the last 72 hours.  Invalid input(s): "FREET3" No results for input(s): "VITAMINB12", "FOLATE", "FERRITIN", "TIBC", "IRON", "RETICCTPCT" in the last 72 hours. Coags: No results for  input(s): "INR" in the last 72 hours.  Invalid input(s): "PT" Microbiology: Recent Results (from the past 240 hour(s))  Resp Panel by RT-PCR (Flu A&B, Covid) Anterior Nasal Swab     Status: None   Collection Time: 04/28/22 12:41 PM   Specimen: Anterior Nasal Swab  Result Value Ref Range Status   SARS Coronavirus 2 by RT PCR NEGATIVE NEGATIVE Final    Comment: (NOTE) SARS-CoV-2 target nucleic acids are NOT DETECTED.  The SARS-CoV-2 RNA is generally detectable in upper respiratory specimens during the acute phase of infection. The lowest concentration of SARS-CoV-2 viral copies this assay can detect is 138 copies/mL. A negative result does not preclude SARS-Cov-2 infection and should not be used as the sole basis for treatment or other patient management decisions. A negative result may occur with  improper specimen collection/handling, submission of specimen other than nasopharyngeal swab, presence of viral mutation(s) within the areas targeted by this assay, and inadequate number of viral copies(<138 copies/mL). A negative result must be combined with clinical observations, patient history, and epidemiological information. The expected result is Negative.  Fact Sheet for Patients:  EntrepreneurPulse.com.au  Fact Sheet for Healthcare Providers:  IncredibleEmployment.be  This test is no t yet approved or cleared by the [place] FDA and  has been authorized for detection and/or diagnosis of SARS-CoV-2 by FDA under an Emergency Use Authorization (EUA). This EUA will remain  in effect (meaning this test can be used) for  the duration of the COVID-19 declaration under Section 564(b)(1) of the Act, 21 U.S.C.section 360bbb-3(b)(1), unless the authorization is terminated  or revoked sooner.       Influenza A by PCR NEGATIVE NEGATIVE Final   Influenza B by PCR NEGATIVE NEGATIVE Final    Comment: (NOTE) The Xpert Xpress SARS-CoV-2/FLU/RSV plus  assay is intended as an aid in the diagnosis of influenza from Nasopharyngeal swab specimens and should not be used as a sole basis for treatment. Nasal washings and aspirates are unacceptable for Xpert Xpress SARS-CoV-2/FLU/RSV testing.  Fact Sheet for Patients: EntrepreneurPulse.com.au  Fact Sheet for Healthcare Providers: IncredibleEmployment.be  This test is not yet approved or cleared by the [place] FDA and has been authorized for detection and/or diagnosis of SARS-CoV-2 by FDA under an Emergency Use Authorization (EUA). This EUA will remain in effect (meaning this test can be used) for the duration of the COVID-19 declaration under Section 564(b)(1) of the Act, 21 U.S.C. section 360bbb-3(b)(1), unless the authorization is terminated or revoked.  Performed at [facility], [place] [address], [place], [place] [address]   Respiratory (~20 pathogens) panel by PCR     Status: Abnormal   Collection Time: 04/28/22 12:41 PM   Specimen: Nasopharyngeal Swab; Respiratory  Result Value Ref Range Status   Adenovirus NOT DETECTED NOT DETECTED Final   Coronavirus 229E NOT DETECTED NOT DETECTED Final    Comment: (NOTE) The Coronavirus on the Respiratory Panel, DOES NOT test for the novel  Coronavirus (2019 nCoV)    Coronavirus HKU1 NOT DETECTED NOT DETECTED Final   Coronavirus NL63 NOT DETECTED NOT DETECTED Final   Coronavirus OC43 NOT DETECTED NOT DETECTED Final   Metapneumovirus NOT DETECTED NOT DETECTED Final   Rhinovirus / Enterovirus NOT DETECTED NOT DETECTED Final   Influenza A NOT DETECTED NOT DETECTED Final   Influenza B NOT DETECTED NOT DETECTED Final   Parainfluenza Virus 1 NOT DETECTED NOT DETECTED Final   Parainfluenza Virus 2 NOT DETECTED NOT DETECTED Final   Parainfluenza Virus 3 NOT DETECTED NOT DETECTED Final   Parainfluenza Virus 4 NOT DETECTED NOT DETECTED Final   Respiratory Syncytial Virus DETECTED (A) NOT DETECTED  Final   Bordetella pertussis NOT DETECTED NOT DETECTED Final   Bordetella Parapertussis NOT DETECTED NOT DETECTED Final   Chlamydophila pneumoniae NOT DETECTED NOT DETECTED Final   Mycoplasma pneumoniae NOT DETECTED NOT DETECTED Final    Comment: Performed at [facility], [place]. [address], [place], [place] [address]  Blood culture (routine x 2)     Status: None   Collection Time: 04/28/22 12:43 PM   Specimen: BLOOD RIGHT ARM  Result Value Ref Range Status   Specimen Description BLOOD RIGHT ARM  Final   Special Requests   Final    BOTTLES DRAWN AEROBIC AND ANAEROBIC Blood Culture results may not be optimal due to an excessive volume of blood received in culture bottles   Culture   Final    NO GROWTH 5 DAYS Performed at [facility], [place] [address], [place], [place] [address]    Report Status 05/03/2022 FINAL  Final  Blood culture (routine x 2)     Status: None   Collection Time: 04/28/22 12:58 PM   Specimen: BLOOD LEFT ARM  Result Value Ref Range Status   Specimen Description BLOOD LEFT ARM  Final   Special Requests   Final    BOTTLES DRAWN AEROBIC AND ANAEROBIC Blood Culture results may not be optimal due to  an excessive volume of blood received in culture bottles   Culture   Final    NO GROWTH 5 DAYS Performed at [facility], [place] [address] [place], [place] [address]    Report Status 05/03/2022 FINAL  Final  MRSA Next Gen by PCR, Nasal     Status: None   Collection Time: 04/29/22  8:23 PM   Specimen: Nasal Mucosa; Nasal Swab  Result Value Ref Range Status   MRSA by PCR Next Gen NOT DETECTED NOT DETECTED Final    Comment: (NOTE) The GeneXpert MRSA Assay (FDA approved for NASAL specimens only), is one component of a comprehensive MRSA colonization surveillance program. It is not intended to diagnose MRSA infection nor to guide or monitor treatment for MRSA infections. Test performance is not FDA approved in patients less than 51 years old. Performed at [facility], [place] [address] [place], [place] [address]     FURTHER DISCHARGE INSTRUCTIONS:  Get Medicines reviewed and adjusted: Please take all your medications with you for your next visit with your Primary MD  Laboratory/radiological data: Please request your Primary MD to go over all hospital tests and procedure/radiological results at the follow up, please ask your Primary MD to get all Hospital records sent to his/her office.  In some cases, they will be blood work, cultures and biopsy results pending at the time of your discharge. Please request that your primary care M.D. goes through all the records of your hospital data and follows up on these results.  Also Note the following: If you experience worsening of your admission symptoms, develop shortness of breath, life threatening emergency, suicidal or homicidal thoughts you must seek medical attention immediately by calling 911 or calling your MD immediately  if symptoms less severe.  You must read complete instructions/literature along with all the possible adverse reactions/side effects for all the Medicines you take and that have been prescribed to you. Take any new Medicines after you have completely understood and accpet all the possible adverse reactions/side effects.   Do not drive when taking Pain medications or sleeping medications (Benzodaizepines)  Do not take more than prescribed Pain, Sleep and Anxiety Medications. It is not advisable to combine anxiety,sleep and pain medications without talking with your primary care practitioner  Special Instructions: If you have smoked or chewed Tobacco  in the last 2 yrs please stop smoking, stop any regular Alcohol  and or any Recreational drug use.  Wear Seat belts while driving.  Please note: You were cared for by a hospitalist during your hospital stay. Once you are discharged, your primary care physician will handle any further medical issues. Please note that NO REFILLS  for any discharge medications will be authorized once you are discharged, as it is imperative that you return to your primary care physician (or establish a relationship with a primary care physician if you do not have one) for your post hospital discharge needs so that they can reassess your need for medications and monitor your lab values.  Total Time spent coordinating discharge including counseling, education and face to face time equals greater than 30 minutes.  SignedOren Binet 05/04/2022 11:58 AM

## 2022-05-04 NOTE — Care Management Important Message (Signed)
Important Message  Patient Details  Name: Kathleen [name] MRN: [id number] Date of Birth: 1945-10-20   Medicare Important Message Given:  Yes     [name] [name] 05/04/2022, 3:38 PM

## 2022-05-05 ENCOUNTER — Telehealth: Payer: Self-pay

## 2022-05-05 ENCOUNTER — Encounter (HOSPITAL_BASED_OUTPATIENT_CLINIC_OR_DEPARTMENT_OTHER): Payer: Self-pay | Admitting: Pulmonary Disease

## 2022-05-05 ENCOUNTER — Other Ambulatory Visit: Payer: Self-pay | Admitting: Internal Medicine

## 2022-05-05 ENCOUNTER — Telehealth: Payer: Self-pay | Admitting: Pulmonary Disease

## 2022-05-05 DIAGNOSIS — E785 Hyperlipidemia, unspecified: Secondary | ICD-10-CM

## 2022-05-05 DIAGNOSIS — D510 Vitamin B12 deficiency anemia due to intrinsic factor deficiency: Secondary | ICD-10-CM

## 2022-05-05 MED ORDER — NEXLETOL 180 MG PO TABS: 1.0000 | ORAL_TABLET | Freq: Every day | ORAL | 0 refills | Status: DC

## 2022-05-05 MED ORDER — LINACLOTIDE 145 MCG PO CAPS: 145.0000 ug | ORAL_CAPSULE | Freq: Every day | ORAL | 0 refills | Status: DC | PRN

## 2022-05-05 MED ORDER — CYANOCOBALAMIN 1000 MCG/ML IJ SOLN: 1000.0000 ug | INTRAMUSCULAR | 0 refills | Status: DC

## 2022-05-05 MED ORDER — YUPELRI 175 MCG/3ML IN SOLN: 175.0000 ug | Freq: Every day | RESPIRATORY_TRACT | 11 refills | Status: DC

## 2022-05-05 NOTE — Telephone Encounter (Signed)
I called and spoke with the pt and she says [facility] needs order for Yupelri refill  I have sent this to them  Nothing further needed

## 2022-05-05 NOTE — Telephone Encounter (Signed)
Patient would like the nurse to call her regarding her medication for Yuperli.  She stated she is having problems getting this medication.  Please call to discuss at [phone]

## 2022-05-05 NOTE — Telephone Encounter (Signed)
Transition Care Management Follow-up Telephone Call Date of discharge and from where: [facility] 05-04-22 Dx: community acquired pneumonia  How have you been since you were released from the hospital? Feeling really weak and tired  Any questions or concerns? No  Items Reviewed: Did the pt receive and understand the discharge instructions provided? Yes  Medications obtained and verified? Yes  Other? No  Any new allergies since your discharge? No  Dietary orders reviewed? Yes Do you have support at home? Yes   Home Care and Equipment/Supplies: Were home health services ordered? Yes PT If so, what is the name of the agency? [facility]   Has the agency set up a time to come to the patient's home? no Were any new equipment or medical supplies ordered?  No What is the name of the medical supply agency? na Were you able to get the supplies/equipment? not applicable Do you have any questions related to the use of the equipment or supplies? No  Functional Questionnaire: (I = Independent and D = Dependent) ADLs: I  Bathing/Dressing- I  Meal Prep- I  Eating- I  Maintaining continence- I  Transferring/Ambulation- I  Managing Meds- I  Follow up appointments reviewed:  PCP Hospital f/u appt confirmed? Yes  Scheduled to see Dr Yetta Barre  on 05-14-22 @ 1040am. Specialist [facility] f/u appt confirmed? Yes  Scheduled to see Dr Elberta Fortis on 05-18-22 @ 1130 and Dr Allison Quarry 05-19-22 at 1130am . Are transportation arrangements needed? No  If their condition worsens, is the pt aware to call PCP or go to the Emergency Dept.? Yes Was the patient provided with contact information for the PCP's office or ED? Yes Was to pt encouraged to call back with questions or concerns? Yes   Woodfin Ganja LPN [facility] Nurse Health Advisor Direct Dial [phone]

## 2022-05-07 ENCOUNTER — Telehealth: Payer: Self-pay | Admitting: Internal Medicine

## 2022-05-07 NOTE — Telephone Encounter (Signed)
Tyler from [facility] called for verbal orders to continue home health PT for: 1X for 1 week 2X for 2 weeks  Please call Joselyn Glassman to confirm:  [phone]  Detailed messages can be left at this number.

## 2022-05-07 NOTE — Telephone Encounter (Signed)
VM left for Joselyn Glassman to proceed with VO for PT for 1X for 1 week 2X for 2 weeks.

## 2022-05-13 ENCOUNTER — Encounter: Payer: Self-pay | Admitting: Family Medicine

## 2022-05-13 ENCOUNTER — Non-Acute Institutional Stay: Payer: Medicare Other | Admitting: Family Medicine

## 2022-05-13 VITALS — BP 126/60 | HR 80 | Temp 98.5°F | Resp 18

## 2022-05-13 DIAGNOSIS — B338 Other specified viral diseases: Secondary | ICD-10-CM

## 2022-05-13 DIAGNOSIS — J9611 Chronic respiratory failure with hypoxia: Secondary | ICD-10-CM

## 2022-05-13 DIAGNOSIS — F321 Major depressive disorder, single episode, moderate: Secondary | ICD-10-CM

## 2022-05-13 DIAGNOSIS — J441 Chronic obstructive pulmonary disease with (acute) exacerbation: Secondary | ICD-10-CM

## 2022-05-13 DIAGNOSIS — I5020 Unspecified systolic (congestive) heart failure: Secondary | ICD-10-CM

## 2022-05-13 DIAGNOSIS — J398 Other specified diseases of upper respiratory tract: Secondary | ICD-10-CM

## 2022-05-13 NOTE — Telephone Encounter (Signed)
Spoke with patient and ICM intro given.  Patient is agreeable to ICM monthly follow up.  Advised transmission will send automatically between 12 Midnight and 6:00 AM if monitor is by bedside.  Explained will call with results after transmission is reviewed.  Explained a Remote Home Transmission will be seen as daytime appointment on office summary visit sheet but the time is not relevant since all transmissions are sent at night time so there is no obligation to stay by the monitor at that time appointed time during the day.    She has been very sick with pneumonia and RSV.  NP will be coming to the home today and usually checks lungs while she is there.  She has all of her post hospital appointments scheduled for this month including Dr Elberta Fortis, Dr Gala Romney, PCP and pulmonologist.  She still has chest congestion and taking mucinex.    She reports havimg a cardiomems but most of the time she cannot get the pillow to work. She will discuss with Dr Suella Grove at12/29 OV.  Advised I do not typically monitor device fluid levels monthly if a cardiomems is in place but she will know more once she has OV with Dr Gala Romney.    She takes Torsemide twice a week.   She lives alone in a independent senior living apartment.   Provided ICM direct number and explained should call if experiencing any fluid symptoms such as weight gain, shortness of breath or extremity/abdominal swelling.   1st ICM remote transmission scheduled for 05/25/2022.

## 2022-05-13 NOTE — Progress Notes (Unsigned)
[facility] Consult Note Telephone: [phone]  Fax: [phone]    Date of encounter: 05/13/22 11:31 AM PATIENT NAME: Kathleen [name] [address] [place] [id number]   [phone] (home)  DOB: 04-23-1946 MRN: [id number] PRIMARY CARE PROVIDER:    [name], MD,  [place] [address] [phone]  REFERRING PROVIDER:   [name], MD [address] [place],  [place] [address] [phone]  RESPONSIBLE PARTY:    Contact Information     Name Relation Home Work Kathleen [name] Son [phone]  [phone]   Kathleen, [name] [phone]  [phone]   Kathleen [name] Daughter   [phone]        I met face to face with patient in [place] [facility] facility. Palliative Care was asked to follow this patient by consultation request of  [name], MD to address advance care planning and complex medical decision making. This is a follow up visit   ASSESSMENT , SYMPTOM MANAGEMENT AND PLAN / RECOMMENDATIONS:   RSV infection/COPD exacerbation Continue steroid taper as prescribed during hospitalization. Keep scheduled follow up with PCP and Pulmonologist. Continue current respiratory neb treatments as prescribed. Will give limited refill of Hydrocodone/Chlompheniramine ER- 5 ml BID prn cough (disp 70 ml, no refill) to allow for sleep and relief of coughing.   Chronic respiratory failure/ tracheobronchiomalacia Continues on O2 @ 4 L, sometimes titrates up to 5 L with increased dyspnea and exertion. Continue to use Trellegy vent at night.  3.    Heart failure with reduced EF Question if result of NSTEMI or type 2 NSTEMI and pt would benefit from addition of SGLT2 inhibitor as she is on ARB, diuretic, not a good candidate for beta blocker with severe pulmonary disease and is already on Aldactone or replacement with entresto. Recommend follow up with Cardiology ASAP  4.     Major Depression, moderate episode Slight improvement in mood and function. Question if pt would benefit and be receptive to SSRI.  Advance Care Planning/Goals of Care: Goals include to maximize quality of life and symptom management.  CODE STATUS: Full code.  Pt has not been ready to discuss code status or transition to more comfort based care.     Follow up Palliative Care Visit: Palliative care will continue to follow for complex medical decision making, advance care planning, and clarification of goals. Return 2 weeks or prn.   This visit was coded based on medical decision making (MDM).  PPS: 40%  HOSPICE ELIGIBILITY/DIAGNOSIS: TBD  Chief Complaint:  Palliative Care is continuing to follow patient for chronic medical management in setting of chronic respiratory failure with recent admission for RSV pneumonia. Currently has "fluid around my heart."  HISTORY OF PRESENT ILLNESS:  Kathleen [name] is a 76 y.o. 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 She had significant serial elevations of troponins during hospitalization in October following her influenza vaccine the day prior to admission with development of fever.  She had a signficant decline from normal EF 55-60% to 35-40% with wall motion abnormality during that hospitalization.  She was recently  treated x 2 for COPD exacerbation with antibiotics and steroids, persistent cough.  She was admitted to the hospital and Influenza A & B, SARS-CoV-2 were negative.  Respiratory biofire panel was positive for RSV with acute on chronic respiratory failure  and pneumonia with COPD exacerbation.  She was taken off antibiotics, started on increased steroid taper and nebulizer treatments.  2d echo was repeated 6 weeks after prior and still indicated LV EF 35-40% with regional wall motion abnormality and dyssynchronous wall movements consistent with RV pacing and trivial pericardial effusion.  Seen sitting on couch at home just after taking DuoNeb treatment.  Continues to cough, had intermittent temp 100 since being home, last was on Sat/SUN.  She has had some narcotic cough medicine with hydrocodone that was allowing her to sleep. She had done a DuoNeb breathing tx with no relief.  She says she had an episode of "smothering" while wearing trellegy last night so she took it off after 3 hours. Endorses chest heaviness intermittently, no pain.   Down to 3 Prednisone tabs daily for now and working on slow taper.  Not on antibiotics currently.  Pt had pansinusitis on maxillofacial CT while inpatient with RSV pneumonia.  She states she has been unable to get an appointment with her Cardiologist Dr Haroldine Laws until January and was originally supposed to be in November.  She has made multiple callls to the office and now appointment has been delayed until January. She expressed concern over the reason behind the decline in her EF and the "fluid around my heart".  Using [place] supplied by [employer].  History obtained from review of EMR, discussion with Ms. Chumney.  CBC    Component Value Date/Time   WBC 13.5 (H) 05/01/2022 0229   RBC 4.31 05/01/2022 0229   HGB 11.7 (L) 05/01/2022 0229   HGB 13.4 12/28/2012 0748   HCT 37.0 05/01/2022 0229   HCT 39.2 12/28/2012 0748   PLT 241 05/01/2022 0229   PLT 186 12/28/2012 0748   MCV 85.8 05/01/2022 0229   MCV 87 12/28/2012 0748   MCH 27.1 05/01/2022 0229   MCHC 31.6 05/01/2022 0229   RDW 14.7 05/01/2022 0229   RDW 15.0 (H) 12/28/2012 0748   LYMPHSABS 1.1 04/28/2022 1200   LYMPHSABS 0.4 (L) 12/28/2012 0748   MONOABS 0.4  04/28/2022 1200   MONOABS 0.5 12/28/2012 0748   EOSABS 0.0 04/28/2022 1200   EOSABS 0.0 12/28/2012 0748   BASOSABS 0.1 04/28/2022 1200   BASOSABS 0.0 12/28/2012 0748      Latest Ref Rng & Units 05/03/2022    3:03 AM 05/01/2022    2:29 AM 04/30/2022    1:58 AM  CMP  Glucose 70 - 99 mg/dL 118  136  144   BUN 8 - 23 mg/dL _0 Creatinine 0.44 - 1.00 mg/dL 0.66  1.00  0.76   Sodium 135 - 145 mmol/L 137  139  137   Potassium 3.5 - 5.1 mmol/L 4.4  4.8  4.7   Chloride 98 - 111 mmol/L 100  101  103   CO2 22 - 32 mmol/L _1 Calcium 8.9 - 10.3 mg/dL 10.4  9.9  9.5     04/28/22 Respiratory Biofire Component Ref Range & Units 2 wk ago 1 mo ago 1 yr ago  Adenovirus NOT DETECTED NOT DETECTED NOT DETECTED NOT DETECTED  Coronavirus 229E NOT DETECTED NOT DETECTED NOT DETECTED CM NOT DETECTED CM  Comment: (  NOTE) The Coronavirus on the Respiratory Panel, DOES NOT test for the novel Coronavirus (2019 nCoV)  Coronavirus HKU1 NOT DETECTED NOT DETECTED NOT DETECTED NOT DETECTED  Coronavirus NL63 NOT DETECTED NOT DETECTED NOT DETECTED NOT DETECTED  Coronavirus OC43 NOT DETECTED NOT DETECTED NOT DETECTED NOT DETECTED  Metapneumovirus NOT DETECTED NOT DETECTED NOT DETECTED NOT DETECTED  Rhinovirus / Enterovirus NOT DETECTED NOT DETECTED NOT DETECTED NOT DETECTED  Influenza A NOT DETECTED NOT DETECTED NOT DETECTED NOT DETECTED  Influenza B NOT DETECTED NOT DETECTED NOT DETECTED NOT DETECTED  Parainfluenza Virus 1 NOT DETECTED NOT DETECTED NOT DETECTED NOT DETECTED  Parainfluenza Virus 2 NOT DETECTED NOT DETECTED NOT DETECTED NOT DETECTED  Parainfluenza Virus 3 NOT DETECTED NOT DETECTED NOT DETECTED NOT DETECTED  Parainfluenza Virus 4 NOT DETECTED NOT DETECTED NOT DETECTED NOT DETECTED  Respiratory Syncytial Virus NOT DETECTED DETECTED Abnormal  NOT DETECTED NOT DETECTED  Bordetella pertussis NOT DETECTED NOT DETECTED NOT DETECTED NOT DETECTED  Bordetella Parapertussis NOT DETECTED NOT  DETECTED NOT DETECTED NOT DETECTED  Chlamydophila pneumoniae NOT DETECTED NOT DETECTED NOT DETECTED NOT DETECTED  Mycoplasma pneumoniae NOT DETECTED NOT DETECTED NOT DETECTED CM NOT DETECTED CM  Comment: Performed at [facility], [place] [address] [place], [place] [address]   04/28/22 CXR PA and lat: IMPRESSION: Slight improvement right lower lung airspace disease. Blunted costophrenic sulci suggesting trace effusions.  04/30/22 MRSA PCR negative 04/28/22 Blood cultures negative x 2  05/04/22 2d echo: 1. Left ventricular ejection fraction 35 to 40%.  Left ventricle has moderately decreased function and demonstrates global hypokinesis with septal-lateral dyssynchrony consistent with RV pacing. The left ventricular   internal cavity size was mildly dilated. Grade I diastolic dysfunction  2. Right ventricular systolic function is mildly reduced. The right  ventricular size is normal. Tricuspid regurgitation signal is inadequate for assessing PA pressure.   3. Trivial mitral valve regurgitation without stenosis.   4. Mild calcification of the aortic valve without regurgitation or  stenosis.   5. Aortic dilatation noted.  Mild dilatation of the ascending  aorta, measuring 38 mm.     I reviewed EMR for available labs, medications, imaging, studies and related documents.  There are no new records since last visit/Records reviewed and summarized above.   ROS General: NAD Cardiovascular: endorses chest pain with cough, endorses DOE Pulmonary: endorses non productive cough, denies increased SOB Abdomen: endorses good appetite, denies constipation, endorses continence of bowel GU: denies dysuria, endorses continence of urine MSK:  denies increased weakness, no falls reported Skin: denies rashes or wounds Neurological: denies pain, denies insomnia Psych:  mood is slightly improved Heme/lymph/immuno: denies bruises, abnormal bleeding  Physical Exam: Current and past  weights: Constitutional: NAD General: WNWD ENMT: intact hearing, oral mucous membranes moist CV: S1S2, IRIR, no LE edema Pulmonary: coarse rub and expiratory wheeze in BLL, worse in right base, no increased work of breathing, hoarse non-productive cough on 5L [place] Abdomen: normo-active BS + 4 quadrants, soft and non tender GU: deferred MSK: no sarcopenia, moves all extremities, ambulatory Skin: warm and dry, no rashes or wounds on visible skin Neuro:  no generalized weakness,  no cognitive impairment Psych: non-anxious affect, A and O x 3 Hem/lymph/immuno: no widespread bruising   Thank you for the opportunity to participate in the care of [name].  The palliative care team will continue to follow. Please call our office at [phone] if we can be of additional assistance.   Marijo Conception, FNP -C  COVID-19 PATIENT  SCREENING TOOL Asked and negative response unless otherwise noted:   Have you had symptoms of covid, tested positive or been in contact with someone with symptoms/positive test in the past 5-10 days?  NO

## 2022-05-14 ENCOUNTER — Encounter: Payer: Self-pay | Admitting: Family Medicine

## 2022-05-14 ENCOUNTER — Encounter: Payer: Self-pay | Admitting: Internal Medicine

## 2022-05-14 ENCOUNTER — Ambulatory Visit (INDEPENDENT_AMBULATORY_CARE_PROVIDER_SITE_OTHER): Payer: Medicare Other

## 2022-05-14 ENCOUNTER — Ambulatory Visit (INDEPENDENT_AMBULATORY_CARE_PROVIDER_SITE_OTHER): Payer: Medicare Other | Admitting: Internal Medicine

## 2022-05-14 ENCOUNTER — Ambulatory Visit: Payer: Medicare Other

## 2022-05-14 VITALS — BP 128/82 | HR 82 | Temp 98.0°F | Ht 61.0 in | Wt 183.0 lb

## 2022-05-14 DIAGNOSIS — J441 Chronic obstructive pulmonary disease with (acute) exacerbation: Secondary | ICD-10-CM

## 2022-05-14 DIAGNOSIS — J189 Pneumonia, unspecified organism: Secondary | ICD-10-CM

## 2022-05-14 DIAGNOSIS — Z515 Encounter for palliative care: Secondary | ICD-10-CM

## 2022-05-14 DIAGNOSIS — B338 Other specified viral diseases: Secondary | ICD-10-CM | POA: Insufficient documentation

## 2022-05-14 DIAGNOSIS — G8929 Other chronic pain: Secondary | ICD-10-CM

## 2022-05-14 DIAGNOSIS — M545 Low back pain, unspecified: Secondary | ICD-10-CM | POA: Diagnosis not present

## 2022-05-14 DIAGNOSIS — I5020 Unspecified systolic (congestive) heart failure: Secondary | ICD-10-CM | POA: Insufficient documentation

## 2022-05-14 MED ORDER — IPRATROPIUM-ALBUTEROL 0.5-2.5 (3) MG/3ML IN SOLN: 3.0000 mL | RESPIRATORY_TRACT | 1 refills | Status: DC | PRN

## 2022-05-14 MED ORDER — TRAMADOL HCL 50 MG PO TABS: 50.0000 mg | ORAL_TABLET | Freq: Four times a day (QID) | ORAL | 1 refills | Status: DC | PRN

## 2022-05-14 NOTE — Progress Notes (Signed)
Subjective:  Patient ID: Kathleen [name], female    DOB: [birth year]  Age: 76 y.o. MRN: [id number]  CC: Cough   HPI FALANA CLAGG presents for f/up -  She was recently admitted for RSV infection that was complicated by RLL PNA. Her cough has improved and is non-productive. She has completed the antibiotics and is taking steroids.   Lurline Idol, FNP  [name], MD Dr Yetta Barre, Just wanted to follow up with you on Kathleen [name] who has a follow up appointment with you tomorrow.  She has had orthopnea long-standing but has had some chest pressure even when using Trellegy vent.  She continues to have a dry, barking cough.  She had some hydrocodone-chlompheniramine ER syrup when she was d/c'd from the hospital (70 cc).  I did order her an additional 70 ml to take 5 ml BID prn for cough.  She states she has "fluid around my heart", echo noted no improvement in EF and "trivial pericardial effusion".  She was able to speak in complete sentences without having to stop to breathe and continues on a Prednisone taper.  She was supposed to have followed up with Dr Gala Romney in November and appt was moved out to December but when she called to schedule they now have her scheduled in January.  She was febrile but had significantly elevated serial troponins in October 2023 and echo at that time showed reduction from normal EF 55% to 35-40%.  Report reads: Left ventricular ejection fraction, by estimation, is 35 to 40%. The left ventricle has moderately decreased function. The left ventricle demonstrates global hypokinesis with septal-lateral dyssynchrony consistent with RV pacing. The left ventricular  internal cavity size was mildly dilated. Left ventricular diastolic parameters are consistent with Grade I diastolic dysfunction.  She states being told she did not have an MI but NSTEMI is listed as reason for echo and with the wall motion abnormality/elevated troponins she might have a type 2  NSTEMI but would likely benefit from sooner follow up with Cardiology.  Can you help with getting her seen by Dr Gala Romney or one of his partners sooner than January if possible? Please let me know if I can be of any assistance to you or there is something to follow up on.  I am attempting to schedule with her for follow up in 2 weeks just to keep a good eye on her since she has also just been treated for RSV pneumonia. Respectfully, Joycelyn Man FNP-C [facility] [phone]  Admit Date: 04/28/2022 Discharge date: 05/04/2022   Recommendations for Outpatient Follow-up:  Follow up with PCP in 1-2 weeks Please obtain CMP/CBC in one week Please ensure follow-up with pulmonology-Taper steroids further depending on clinical response during that visit. Consider ENT follow-up-see below   Admitted From:  Home   Disposition: Home health   Discharge Condition: fair   CODE STATUS:   Code Status: Full Code    Diet recommendation:  Diet Order                  Diet - low sodium heart healthy             Diet heart healthy/carb modified Room service appropriate? No; Fluid consistency: Thin  Diet effective now                         Brief Summary: 59-year-old with a history of atrial fibrillation, BOOP, tracheobronchomalacia, COPD, OSA on  BiPAP, pacemaker, and chronic CHF who presented to the ER with shortness of breath.  She was seen by her Pulmonologist 11/14 and again 11/21 AM and despite antibiotic therapy her symptoms progressed.   Significant studies 11/21>> COVID influenza PCR: Negative 11/21>> respiratory virus panel: Positive for RSV 11/21>> blood culture: No growth 11/21>> CXR: Improvement in right lower lobe airspace disease. 11/21>> CT maxillofacial: Diffuse paranasal sinus disease   Consultants:  PCCM   Goals of Care:  Code Status: Full Code    DVT prophylaxis: Xarelto   Brief Hospital Course: RSV pneumonia/URI - BOOP Overall  improved-stable Requiring around 4-5 L of oxygen Continue bronchodilators Slowly taper steroids-PCCM will arrange follow-up in the clinic-await decision to prolong steroid taper can be made.   Chronic sinusitis Sinus CT noted diffuse paranasal sinus disease - needs to follow-up with ENT as an outpatient - saline irrigation added    Tracheobronchomalacia - OSA Continue nightly BiPAP - poorly compliant with BiPAP at home - recommendations as per Pulmonary -counseled patient on need to strictly adhere to nightly BiPAP use and how failure to do so will contribute to her respiratory symptoms even during the day   COPD with acute exacerbation Much improved  Moving air well-mostly transmitted upper airway sounds  Requiring around 4-5 L of oxygen (4 L at baseline)    Chronic combined systolic and diastolic CHF without acute exacerbation Given 1 dose of IV Lasix on 11/26 with significant improvement in leg edema Resume prior regimen of Demadex/Aldactone Resume losartan on discharge Repeat echo on 11/27-essentially same as prior-EF 35-40% Follow-up with cardiology   HTN BP stable on diuretic regimen.   Resume losartan on discharge   Chronic atrial fibrillation Continue Tikosyn and Xarelto -heart rate controlled at present   DM Diet controlled/does not require home medication - monitor CBG while on steroid   CKD stage IIIa Baseline creatinine appears to be approximately 0.8 -creatinine ever so slightly elevated at admission at 1.0, and has now returned to her baseline   Obesity: Estimated body mass index is 34.57 kg/m as calculated from the following:   Height as of this encounter: 5\' 1"  (1.549 m).   Weight as of this encounter: 83 kg.      Discharge Diagnoses:  Principal Problem:   CAP (community acquired pneumonia) Active Problems:   HTN (hypertension)   Atrial fibrillation (HCC)   Sleep apnea   Type 2 diabetes mellitus with diabetic neuropathy, without long-term current use  of insulin (HCC)   Stage 3a chronic kidney disease (HCC)   Hyperlipidemia with target LDL less than 100   COPD with acute exacerbation (HCC)   Tracheobronchomalacia   Pneumonia    Outpatient Medications Prior to Visit  Medication Sig Dispense Refill   acetaminophen (TYLENOL) 650 MG CR tablet Take 1,300 mg by mouth every 8 (eight) hours as needed for pain.     albuterol (VENTOLIN HFA) 108 (90 Base) MCG/ACT inhaler Inhale 2 puffs into the lungs every 4 (four) hours as needed for wheezing or shortness of breath. 8 g 2   azelastine (ASTELIN) 0.1 % nasal spray Place 2 sprays into both nostrils 2 (two) times daily. 30 mL 5   Bempedoic Acid (NEXLETOL) 180 MG TABS Take 1 tablet by mouth daily. 90 tablet 0   benzonatate (TESSALON) 200 MG capsule Take 1 capsule (200 mg total) by mouth 3 (three) times daily as needed for cough. 20 capsule 0   chlorpheniramine-HYDROcodone (TUSSIONEX) 10-8 MG/5ML Take 5 mLs by mouth every  12 (twelve) hours. 70 mL 0   Cholecalciferol (VITAMIN D3) 50 MCG (2000 UT) TABS Take 2,000 Units by mouth at bedtime.     cyanocobalamin (VITAMIN B12) 1000 MCG/ML injection Inject 1 mL (1,000 mcg total) into the muscle every 30 (thirty) days. 10 mL 0   docusate sodium (COLACE) 100 MG capsule Take 100 mg by mouth 2 (two) times daily.     dofetilide (TIKOSYN) 250 MCG capsule Take 1 capsule (250 mcg total) by mouth in the morning and at bedtime. 60 capsule 6   febuxostat (ULORIC) 40 MG tablet TAKE 1 TABLET(40 MG) BY MOUTH EVERY NIGHT (Patient taking differently: Take 40 mg by mouth at bedtime.) 90 tablet 0   fluticasone furoate-vilanterol (BREO ELLIPTA) 200-25 MCG/ACT AEPB INHALE 1 PUFF INTO THE LUNGS DAILY 180 each 3   linaclotide (LINZESS) 145 MCG CAPS capsule Take 1 capsule (145 mcg total) by mouth daily as needed (constipation). 90 capsule 0   losartan (COZAAR) 25 MG tablet TAKE 1 TABLET(25 MG) BY MOUTH AT BEDTIME (Patient taking differently: Take 25 mg by mouth at bedtime.) 90 tablet 3    Magnesium 500 MG TABS Take 500 mg by mouth at bedtime.     mupirocin ointment (BACTROBAN) 2 % Apply 1 application. topically 2 (two) times daily. (Patient taking differently: Apply 1 application  topically 2 (two) times daily as needed (wound care).) 30 g 2   nitroGLYCERIN (NITROSTAT) 0.4 MG SL tablet Place 0.4 mg under the tongue every 5 (five) minutes as needed for chest pain. Patient has for emergency use     nystatin cream (MYCOSTATIN) Apply to affected area 2 times daily (Patient taking differently: Apply 1 Application topically 2 (two) times daily as needed for dry skin.) 90 g 1   OXYGEN Inhale 4.5 L into the lungs continuous. Use with resmed ventilator     predniSONE (DELTASONE) 10 MG tablet Take 4 tablets (40 mg) daily for 5 days, then 3 tablets (30 mg) daily for 5 days, then, 2 tablets (20 mg) daily for 5 days, then,Take 1 tablets (10 mg) daily for 5 days, then stop 50 tablet 0   revefenacin (YUPELRI) 175 MCG/3ML nebulizer solution Take 3 mLs (175 mcg total) by nebulization daily. 90 mL 11   rivaroxaban (XARELTO) 20 MG TABS tablet Take 1 tablet (20 mg total) by mouth daily with supper. 30 tablet 5   spironolactone (ALDACTONE) 25 MG tablet TAKE 1 TABLET(25 MG) BY MOUTH EVERY MORNING (Patient taking differently: Take 25 mg by mouth daily.) 90 tablet 3   torsemide (DEMADEX) 20 MG tablet Take  (1 Tab) Monday and Friday (Patient taking differently: Take 20 mg by mouth 2 (two) times a week. Take  (1 Tab) Monday and Friday) 60 tablet 3   ipratropium-albuterol (DUONEB) 0.5-2.5 (3) MG/3ML SOLN Use 1 vial (3 mLs) by nebulization every 4 (four) hours as needed. 360 mL 1   traMADol (ULTRAM) 50 MG tablet Take 50 mg by mouth every 6 (six) hours as needed for pain.     No facility-administered medications prior to visit.    ROS Review of Systems  Constitutional:  Positive for fatigue. Negative for chills, fever and unexpected weight change.  HENT: Negative.  Negative for sore throat.   Eyes:  Negative.   Respiratory:  Positive for cough and shortness of breath. Negative for chest tightness and wheezing.   Cardiovascular:  Negative for chest pain, palpitations and leg swelling.  Gastrointestinal: Negative.  Negative for abdominal pain, diarrhea, nausea and vomiting.  Endocrine: Negative.   Genitourinary: Negative.  Negative for difficulty urinating.  Musculoskeletal:  Positive for back pain. Negative for myalgias and neck pain.  Skin: Negative.   Allergic/Immunologic: Negative.   Neurological: Negative.  Negative for dizziness and weakness.  Hematological:  Negative for adenopathy. Does not bruise/bleed easily.  Psychiatric/Behavioral: Negative.      Objective:  BP 128/82 (BP Location: Right Arm, Patient Position: Sitting, Cuff Size: Large)   Pulse 82   Temp 98 F (36.7 C) (Oral)   Ht 5\' 1"  (1.549 m)   Wt 183 lb (83 kg)   SpO2 96%   BMI 34.58 kg/m   BP Readings from Last 3 Encounters:  05/18/22 124/80  05/14/22 128/82  05/13/22 126/60    Wt Readings from Last 3 Encounters:  05/18/22 184 lb (83.5 kg)  05/14/22 183 lb (83 kg)  04/28/22 182 lb 15.7 oz (83 kg)    Physical Exam Vitals reviewed.  Constitutional:      General: She is not in acute distress.    Appearance: She is not toxic-appearing or diaphoretic.  Eyes:     General: No scleral icterus.    Pupils: Pupils are equal, round, and reactive to light.  Cardiovascular:     Rate and Rhythm: Normal rate and regular rhythm.     Heart sounds: No murmur heard. Pulmonary:     Effort: No tachypnea.     Breath sounds: No stridor. Examination of the right-middle field reveals rhonchi. Examination of the left-middle field reveals rhonchi. Examination of the right-lower field reveals rhonchi. Examination of the left-lower field reveals rhonchi. Rhonchi present. No decreased breath sounds, wheezing or rales.  Chest:     Chest wall: No tenderness.  Abdominal:     General: Abdomen is flat.     Palpations: There is  no mass.     Tenderness: There is no abdominal tenderness. There is no guarding.     Hernia: No hernia is present.  Musculoskeletal:        General: Normal range of motion.     Cervical back: Neck supple.     Right lower leg: No edema.     Left lower leg: No edema.  Lymphadenopathy:     Cervical: No cervical adenopathy.  Skin:    General: Skin is warm and dry.  Neurological:     General: No focal deficit present.     Mental Status: She is alert.  Psychiatric:        Mood and Affect: Mood normal.        Behavior: Behavior normal.     Lab Results  Component Value Date   WBC 13.5 (H) 05/01/2022   HGB 11.7 (L) 05/01/2022   HCT 37.0 05/01/2022   PLT 241 05/01/2022   GLUCOSE 118 (H) 05/03/2022   CHOL 174 08/07/2021   TRIG 200.0 (H) 08/07/2021   HDL 35.00 (L) 08/07/2021   LDLCALC 99 08/07/2021   ALT 14 04/28/2022   AST 14 (L) 04/28/2022   NA 137 05/03/2022   K 4.4 05/03/2022   CL 100 05/03/2022   CREATININE 0.66 05/03/2022   BUN 22 05/03/2022   CO2 28 05/03/2022   TSH 2.949 12/25/2021   INR 1.4 (H) 10/04/2021   HGBA1C 5.8 (H) 04/30/2022   MICROALBUR 0.8 08/07/2021    CT MAXILLOFACIAL WO CONTRAST  Result Date: 04/28/2022 CLINICAL DATA:  Head trauma, abnormal mental status. Sinusitis. Trauma to left face a nasal passage in the past. EXAM: CT MAXILLOFACIAL WITHOUT CONTRAST TECHNIQUE:  Multidetector CT imaging of the maxillofacial structures was performed. Multiplanar CT image reconstructions were also generated. RADIATION DOSE REDUCTION: This exam was performed according to the departmental dose-optimization program which includes automated exposure control, adjustment of the mA and/or kV according to patient size and/or use of iterative reconstruction technique. COMPARISON:  03/20/2022. FINDINGS: Osseous: No fracture or mandibular dislocation. No destructive process. Orbits: Negative. No traumatic or inflammatory finding. Sinuses: There is partial opacification of the right  frontal sinus, bilateral ethmoid air cells, bilateral sphenoid and maxillary sinuses. Soft tissues: No significant soft tissue abnormality. Limited intracranial: No acute abnormality.  Atrophy is noted. IMPRESSION: 1. No evidence of acute fracture. 2. Diffuse paranasal sinus disease. Electronically Signed   By: Thornell Sartorius M.D.   On: 04/28/2022 23:49   DG Chest 2 View  Result Date: 04/28/2022 CLINICAL DATA:  RLL pna; worsening dyspnea/cough despite abx therapy EXAM: CHEST - 2 VIEW COMPARISON:  Radiograph 06/21/2021 FINDINGS: Unchanged cardiomediastinal silhouette and pacemaker leads. Unchanged CardioMEMS device. There is no focal airspace consolidation. Slight improvement in right lower lung airspace disease. Blunted costophrenic sulci suggesting trace effusions. No evidence of pneumothorax. No acute osseous abnormality. Thoracic spondylosis. IMPRESSION: Slight improvement right lower lung airspace disease. Blunted costophrenic sulci suggesting trace effusions. Electronically Signed   By: Caprice Renshaw M.D.   On: 04/28/2022 10:20   DG Chest 2 View  Result Date: 05/14/2022 CLINICAL DATA:  Pneumonia. EXAM: CHEST - 2 VIEW COMPARISON:  April 28, 2022. FINDINGS: Stable cardiomediastinal silhouette. Left-sided pacemaker is unchanged in position. Left lung is clear. Stable right infrahilar opacity is noted concerning for possible atelectasis or infiltrate. Bony thorax is unremarkable. IMPRESSION: Stable right infrahilar opacity is scribe above. Electronically Signed   By: Lupita Raider M.D.   On: 05/14/2022 11:33   ECHOCARDIOGRAM COMPLETE  Result Date: 05/04/2022    ECHOCARDIOGRAM REPORT   Patient Name:   Kathleen [name] Date of Exam: 05/04/2022 Medical Rec #:  [id number]       Height: Accession #:    [id number]      Weight: Date of Birth:  03-15-1946       BSA: Patient Age:    76 years        BP:           152/85 mmHg Patient Gender: F               HR:           73 bpm. Exam Location:  Inpatient  Procedure: 2D Echo, Cardiac Doppler, Color Doppler and Intracardiac            Opacification Agent Indications:    CHF-Acute Diastolic I50.31  History:        Patient has prior history of Echocardiogram examinations, most                 recent 03/19/2022. Cardiomyopathy and CHF, NSTEMMi, Pacemaker,                 Arrythmias:Atrial Fibrillation, Signs/Symptoms:Hypotension; Risk                 Factors:Hypertension, Sleep Apnea, Diabetes, Dyslipidemia and                 Former Smoker.  Sonographer:    Aron Baba Referring Phys: [id number] DANIEL R BENSIMHON  Sonographer Comments: Technically challenging study due to limited acoustic windows. Image acquisition challenging due to patient body habitus, Image acquisition challenging due to COPD and Image acquisition  challenging due to respiratory motion. Patient  not able to lie flat due to breathing. IMPRESSIONS  1. Left ventricular ejection fraction, by estimation, is 35 to 40%. The left ventricle has moderately decreased function. The left ventricle demonstrates global hypokinesis with septal-lateral dyssynchrony consistent with RV pacing. The left ventricular  internal cavity size was mildly dilated. Left ventricular diastolic parameters are consistent with Grade I diastolic dysfunction (impaired relaxation).  2. Right ventricular systolic function is mildly reduced. The right ventricular size is normal. Tricuspid regurgitation signal is inadequate for assessing PA pressure.  3. The mitral valve is normal in structure. Trivial mitral valve regurgitation. No evidence of mitral stenosis.  4. The aortic valve is tricuspid. There is mild calcification of the aortic valve. Aortic valve regurgitation is not visualized. No aortic stenosis is present.  5. Aortic dilatation noted. There is mild dilatation of the ascending aorta, measuring 38 mm.  6. The inferior vena cava is normal in size with greater than 50% respiratory variability, suggesting right atrial pressure of 3  mmHg.  7. Technically difficult study with poor acoustic windows. FINDINGS  Left Ventricle: Left ventricular ejection fraction, by estimation, is 35 to 40%. The left ventricle has moderately decreased function. The left ventricle demonstrates global hypokinesis. Definity contrast agent was given IV to delineate the left ventricular endocardial borders. The left ventricular internal cavity size was mildly dilated. There is no left ventricular hypertrophy. Left ventricular diastolic parameters are consistent with Grade I diastolic dysfunction (impaired relaxation). Right Ventricle: The right ventricular size is normal. No increase in right ventricular wall thickness. Right ventricular systolic function is mildly reduced. Tricuspid regurgitation signal is inadequate for assessing PA pressure. Left Atrium: Left atrial size was normal in size. Right Atrium: Right atrial size was normal in size. Pericardium: Trivial pericardial effusion is present. Mitral Valve: The mitral valve is normal in structure. Trivial mitral valve regurgitation. No evidence of mitral valve stenosis. Tricuspid Valve: The tricuspid valve is normal in structure. Tricuspid valve regurgitation is not demonstrated. Aortic Valve: The aortic valve is tricuspid. There is mild calcification of the aortic valve. Aortic valve regurgitation is not visualized. No aortic stenosis is present. Pulmonic Valve: The pulmonic valve was normal in structure. Pulmonic valve regurgitation is trivial. Aorta: The aortic root is normal in size and structure and aortic dilatation noted. There is mild dilatation of the ascending aorta, measuring 38 mm. Venous: The inferior vena cava is normal in size with greater than 50% respiratory variability, suggesting right atrial pressure of 3 mmHg. IAS/Shunts: No atrial level shunt detected by color flow Doppler. Additional Comments: A device lead is visualized in the right ventricle. Dalton [employer] Electronically signed by Wilfred Lacy Signature Date/Time: 05/04/2022/1:18:06 PM    Final    CT MAXILLOFACIAL WO CONTRAST  Result Date: 04/28/2022 CLINICAL DATA:  Head trauma, abnormal mental status. Sinusitis. Trauma to left face a nasal passage in the past. EXAM: CT MAXILLOFACIAL WITHOUT CONTRAST TECHNIQUE: Multidetector CT imaging of the maxillofacial structures was performed. Multiplanar CT image reconstructions were also generated. RADIATION DOSE REDUCTION: This exam was performed according to the departmental dose-optimization program which includes automated exposure control, adjustment of the mA and/or kV according to patient size and/or use of iterative reconstruction technique. COMPARISON:  03/20/2022. FINDINGS: Osseous: No fracture or mandibular dislocation. No destructive process. Orbits: Negative. No traumatic or inflammatory finding. Sinuses: There is partial opacification of the right frontal sinus, bilateral ethmoid air cells, bilateral sphenoid and maxillary sinuses. Soft tissues: No significant soft tissue  abnormality. Limited intracranial: No acute abnormality.  Atrophy is noted. IMPRESSION: 1. No evidence of acute fracture. 2. Diffuse paranasal sinus disease. Electronically Signed   By: Thornell Sartorius M.D.   On: 04/28/2022 23:49   DG Chest 2 View  Result Date: 04/28/2022 CLINICAL DATA:  RLL pna; worsening dyspnea/cough despite abx therapy EXAM: CHEST - 2 VIEW COMPARISON:  Radiograph 06/21/2021 FINDINGS: Unchanged cardiomediastinal silhouette and pacemaker leads. Unchanged CardioMEMS device. There is no focal airspace consolidation. Slight improvement in right lower lung airspace disease. Blunted costophrenic sulci suggesting trace effusions. No evidence of pneumothorax. No acute osseous abnormality. Thoracic spondylosis. IMPRESSION: Slight improvement right lower lung airspace disease. Blunted costophrenic sulci suggesting trace effusions. Electronically Signed   By: Caprice Renshaw M.D.   On: 04/28/2022 10:20   DG  Chest 2 View  Result Date: 04/21/2022 CLINICAL DATA:  Productive cough EXAM: CHEST - 2 VIEW COMPARISON:  Chest radiograph 03/17/2022, CTA chest 03/18/2022 FINDINGS: The left chest wall cardiac device and associated leads are stable. The cardiomediastinal silhouette is stable with unchanged mild cardiomegaly. Lungs remain hyperinflated with flattening of the diaphragms consistent with underlying COPD. There are patchy opacities in the right lower lobe which are increased from the prior study. There is no other focal airspace disease. There is no pulmonary edema. There is no pleural effusion or pneumothorax There is no acute osseous abnormality. IMPRESSION: Patchy opacities in the right lower lobe suspicious for pneumonia superimposed on background COPD. Recommend follow-up radiographs in 3-4 weeks to ensure resolution. Electronically Signed   By: Lesia Hausen M.D.   On: 04/21/2022 12:20   CUP PACEART REMOTE DEVICE CHECK  Result Date: 04/02/2022 Scheduled remote reviewed. Normal device function.  Next remote 91 days. [place]  CARDIAC CATHETERIZATION  Result Date: 03/20/2022   Prox LAD to Mid LAD lesion is 25% stenosed.   Prox Cx to Dist Cx lesion is 10% stenosed.   LV end diastolic pressure is normal.   Hemodynamic findings consistent with pulmonary hypertension. Minor nonobstructive CAD Normal LV filling pressures. PCWP 15/18 mm Hg with mean 13 mm Hg. LVEDP 14 mm Hg Mild pulmonary HTN. PAP 41/15, mean 28 mm Hg Preserved cardiac output 5.31 l/min with index 2.85. Plan: recommend continued medical therapy. Does not appear to be volume overloaded. Suspect troponin leak secondary to acute febrile illness.  ECHOCARDIOGRAM COMPLETE  Result Date: 03/19/2022    ECHOCARDIOGRAM REPORT   Patient Name:   Kathleen [name] Date of Exam: 03/19/2022 Medical Rec #:  [id number]       Height:       61.0 in Accession #:    [id number]      Weight:       193.6 lb Date of Birth:  03-Dec-1945       BSA:          1.863 m Patient  Age:    76 years        BP:           106/46 mmHg Patient Gender: F               HR:           77 bpm. Exam Location:  Inpatient Procedure: 2D Echo, 3D Echo, Cardiac Doppler, Color Doppler and Intracardiac            Opacification Agent Indications:    NSTEMI I21.4  History:        Patient has no prior history of Echocardiogram examinations.  Cardiomyopathy and CHF, Pacemaker, COPD, Arrythmias:Atrial                 Fibrillation and Atrial Flutter; Risk Factors:Hypertension.  Sonographer:    Eulah Pont RDCS Referring Phys: Candelaria Stagers, T IMPRESSIONS  1. Left ventricular ejection fraction, by estimation, is 35 to 40%. The left ventricle has moderately decreased function. The left ventricle demonstrates regional wall motion abnormalities (see scoring diagram/findings for description). Left ventricular  diastolic parameters are consistent with Grade I diastolic dysfunction (impaired relaxation). Elevated left ventricular end-diastolic pressure.  2. Right ventricular systolic function is normal. The right ventricular size is normal. There is mildly elevated pulmonary artery systolic pressure.  3. Right atrial size was mildly dilated.  4. The mitral valve is normal in structure. Mild mitral valve regurgitation. No evidence of mitral stenosis.  5. The aortic valve is tricuspid. There is mild calcification of the aortic valve. Aortic valve regurgitation is not visualized. No aortic stenosis is present.  6. Aortic dilatation noted. There is mild dilatation of the ascending aorta, measuring 37 mm.  7. The inferior vena cava is dilated in size with >50% respiratory variability, suggesting right atrial pressure of 8 mmHg. FINDINGS  Left Ventricle: Left ventricular ejection fraction, by estimation, is 35 to 40%. The left ventricle has moderately decreased function. The left ventricle demonstrates regional wall motion abnormalities. Definity contrast agent was given IV to delineate the left ventricular  endocardial borders. The left ventricular internal cavity size was normal in size. There is no left ventricular hypertrophy. Left ventricular diastolic parameters are consistent with Grade I diastolic dysfunction (impaired relaxation). Elevated left ventricular end-diastolic pressure.  LV Wall Scoring: The entire septum, entire inferior wall, and apex are hypokinetic. The entire anterior wall and entire lateral wall are normal. Right Ventricle: The right ventricular size is normal. No increase in right ventricular wall thickness. Right ventricular systolic function is normal. There is mildly elevated pulmonary artery systolic pressure. The tricuspid regurgitant velocity is 2.72  m/s, and with an assumed right atrial pressure of 8 mmHg, the estimated right ventricular systolic pressure is 37.6 mmHg. Left Atrium: Left atrial size was normal in size. Right Atrium: Right atrial size was mildly dilated. Pericardium: There is no evidence of pericardial effusion. Mitral Valve: The mitral valve is normal in structure. Mild mitral valve regurgitation, with posteriorly-directed jet. No evidence of mitral valve stenosis. Tricuspid Valve: The tricuspid valve is normal in structure. Tricuspid valve regurgitation is mild . No evidence of tricuspid stenosis. Aortic Valve: The aortic valve is tricuspid. There is mild calcification of the aortic valve. Aortic valve regurgitation is not visualized. No aortic stenosis is present. Pulmonic Valve: The pulmonic valve was normal in structure. Pulmonic valve regurgitation is trivial. No evidence of pulmonic stenosis. Aorta: Aortic dilatation noted. There is mild dilatation of the ascending aorta, measuring 37 mm. Venous: The inferior vena cava is dilated in size with greater than 50% respiratory variability, suggesting right atrial pressure of 8 mmHg. IAS/Shunts: No atrial level shunt detected by color flow Doppler.  LEFT VENTRICLE PLAX 2D LVIDd:         5.10 cm     Diastology LVIDs:          4.10 cm     LV e' medial:    5.36 cm/s LV PW:         1.20 cm     LV E/e' medial:  14.3 LV IVS:        1.00 cm  LV e' lateral:   6.41 cm/s LVOT diam:     2.10 cm     LV E/e' lateral: 12.0 LV SV:         62 LV SV Index:   33 LVOT Area:     3.46 cm                             3D Volume EF: LV Volumes (MOD)           3D EF:        39 % LV vol d, MOD A2C: 86.2 ml LV EDV:       135 ml LV vol d, MOD A4C: 90.6 ml LV ESV:       83 ml LV vol s, MOD A2C: 54.0 ml LV SV:        52 ml LV vol s, MOD A4C: 61.6 ml LV SV MOD A2C:     32.2 ml LV SV MOD A4C:     90.6 ml LV SV MOD BP:      30.7 ml RIGHT VENTRICLE RV S prime:     11.50 cm/s TAPSE (M-mode): 2.1 cm LEFT ATRIUM             Index        RIGHT ATRIUM           Index LA diam:        3.50 cm 1.88 cm/m   RA Area:     19.30 cm LA Vol (A2C):   64.0 ml 34.36 ml/m  RA Volume:   55.90 ml  30.01 ml/m LA Vol (A4C):   53.3 ml 28.61 ml/m LA Biplane Vol: 58.9 ml 31.62 ml/m  AORTIC VALVE LVOT Vmax:   106.00 cm/s LVOT Vmean:  66.000 cm/s LVOT VTI:    0.180 m  AORTA Ao Asc diam: 3.70 cm MITRAL VALVE               TRICUSPID VALVE MV Area (PHT): 3.99 cm    TR Peak grad:   29.6 mmHg MV Decel Time: 190 msec    TR Vmax:        272.00 cm/s MV E velocity: 76.70 cm/s MV A velocity: 66.00 cm/s  SHUNTS MV E/A ratio:  1.16        Systemic VTI:  0.18 m                            Systemic Diam: 2.10 cm Chilton Si MD Electronically signed by Chilton Si MD Signature Date/Time: 03/19/2022/2:21:27 PM    Final    CT Angio Chest Pulmonary Embolism (PE) W or WO Contrast  Result Date: 03/18/2022 CLINICAL DATA:  High clinical suspicion for PE EXAM: CT ANGIOGRAPHY CHEST WITH CONTRAST TECHNIQUE: Multidetector CT imaging of the chest was performed using the standard protocol during bolus administration of intravenous contrast. Multiplanar CT image reconstructions and MIPs were obtained to evaluate the vascular anatomy. RADIATION DOSE REDUCTION: This exam was performed according to the  departmental dose-optimization program which includes automated exposure control, adjustment of the mA and/or kV according to patient size and/or use of iterative reconstruction technique. CONTRAST:  25mL OMNIPAQUE IOHEXOL 350 MG/ML SOLN COMPARISON:  Previous CT done on 09/03/2021, chest radiograph done on 03/17/2022 FINDINGS: Cardiovascular: There are scattered coronary artery calcifications. Pacemaker battery is seen in the left infraclavicular region. Contrast density in the lumen of thoracic  aorta is less than adequate to evaluate for dissection. There is ectasia of main pulmonary artery measuring 3.8 cm suggesting pulmonary arterial hypertension. There are no intraluminal filling defects in central pulmonary artery branches. Motion artifacts limit evaluation of small peripheral branches. Mediastinum/Nodes: No significant lymphadenopathy seen. Lungs/Pleura: Centrilobular emphysema is seen. Small linear densities are seen in lower lung fields suggesting scarring or subsegmental atelectasis. There is no focal pulmonary consolidation. There is no pleural effusion or pneumothorax. There is narrowing of AP diameter of trachea and mainstem bronchi suggesting possible bronchospasm. Upper Abdomen: No acute findings are seen. Musculoskeletal: Degenerative changes are noted in both shoulders. Degenerative changes are noted in lower thoracic spine. Review of the MIP images confirms the above findings. IMPRESSION: There is no evidence of pulmonary artery embolism. There is ectasia of main pulmonary artery suggesting pulmonary arterial hypertension. Coronary artery calcifications are seen. Centrilobular emphysema. Linear densities in the lower lung fields suggest scarring or subsegmental atelectasis. Electronically Signed   By: Ernie Avena M.D.   On: 03/18/2022 13:09   CT Head Wo Contrast  Result Date: 03/17/2022 CLINICAL DATA:  Mental status change, unknown cause. Fever and weakness. EXAM: CT HEAD WITHOUT  CONTRAST TECHNIQUE: Contiguous axial images were obtained from the base of the skull through the vertex without intravenous contrast. RADIATION DOSE REDUCTION: This exam was performed according to the departmental dose-optimization program which includes automated exposure control, adjustment of the mA and/or kV according to patient size and/or use of iterative reconstruction technique. COMPARISON:  10/05/2021. FINDINGS: Brain: No acute intracranial hemorrhage, midline shift or mass effect. No extra-axial fluid collection. Diffuse atrophy is noted. Gray-white matter differentiation is within normal limits. No hydrocephalus. Vascular: No hyperdense vessel or unexpected calcification. Skull: No acute fracture. Sinuses/Orbits: There is partial opacification of the sphenoid sinus on the left and ethmoid air cells on the right. No acute orbital abnormality. Other: None. IMPRESSION: 1. No acute intracranial process. 2. Atrophy. Electronically Signed   By: Thornell Sartorius M.D.   On: 03/17/2022 20:59   DG Chest Port 1 View  Result Date: 03/17/2022 CLINICAL DATA:  Fever. Weakness. EXAM: PORTABLE CHEST 1 VIEW COMPARISON:  11/19/2021, CT 09/03/2021 FINDINGS: Multi lead left-sided pacemaker in place. Stable cardiomegaly. Unchanged mediastinal contours with aortic atherosclerosis. Chronic hyperinflation. Increased bronchial thickening from prior. Left lung base assessment is suboptimal due to overlapping soft tissue structures. No large pleural effusion. No pneumothorax. Chronic right shoulder arthropathy. IMPRESSION: 1. Increased bronchial thickening from prior, may be infectious or inflammatory. 2. Chronic hyperinflation.  Unchanged cardiomegaly. Electronically Signed   By: Narda Rutherford M.D.   On: 03/17/2022 19:08     Assessment & Plan:   Sariah was seen today for cough.  Diagnoses and all orders for this visit:  Pneumonia of right lower lobe due to infectious organism- CXR is unchanged. Based on her improving  sx's I don't think additional antibiotics are indicated. -     DG Chest 2 View; Future  Chronic left-sided low back pain without sciatica -     traMADol (ULTRAM) 50 MG tablet; Take 1 tablet (50 mg total) by mouth every 6 (six) hours as needed.  COPD with acute exacerbation (HCC) -     ipratropium-albuterol (DUONEB) 0.5-2.5 (3) MG/3ML SOLN; Use 1 vial (3 mLs) by nebulization every 4 (four) hours as needed.  Encounter for palliative care involving management of pain -     traMADol (ULTRAM) 50 MG tablet; Take 1 tablet (50 mg total) by mouth every 6 (six) hours  as needed.   I have changed Bevelyn Ngo. Kathleen [name]'s traMADol. I am also having her maintain her Magnesium, Vitamin D3, docusate sodium, OXYGEN, acetaminophen, torsemide, nystatin cream, mupirocin ointment, losartan, spironolactone, rivaroxaban, azelastine, febuxostat, albuterol, dofetilide, fluticasone furoate-vilanterol, nitroGLYCERIN, benzonatate, chlorpheniramine-HYDROcodone, predniSONE, cyanocobalamin, linaclotide, Nexletol, Yupelri, and ipratropium-albuterol.  Meds ordered this encounter  Medications   traMADol (ULTRAM) 50 MG tablet    Sig: Take 1 tablet (50 mg total) by mouth every 6 (six) hours as needed.    Dispense:  360 tablet    Refill:  1   ipratropium-albuterol (DUONEB) 0.5-2.5 (3) MG/3ML SOLN    Sig: Use 1 vial (3 mLs) by nebulization every 4 (four) hours as needed.    Dispense:  360 mL    Refill:  1     Follow-up: No follow-ups on file.  Kathleen Linger, MD

## 2022-05-18 ENCOUNTER — Ambulatory Visit: Payer: Medicare Other | Attending: Cardiology | Admitting: Cardiology

## 2022-05-18 ENCOUNTER — Encounter: Payer: Self-pay | Admitting: Cardiology

## 2022-05-18 VITALS — BP 124/80 | HR 74 | Ht 61.0 in | Wt 184.0 lb

## 2022-05-18 DIAGNOSIS — I442 Atrioventricular block, complete: Secondary | ICD-10-CM | POA: Diagnosis not present

## 2022-05-18 DIAGNOSIS — Z79899 Other long term (current) drug therapy: Secondary | ICD-10-CM | POA: Insufficient documentation

## 2022-05-18 DIAGNOSIS — I4819 Other persistent atrial fibrillation: Secondary | ICD-10-CM | POA: Insufficient documentation

## 2022-05-18 DIAGNOSIS — D6869 Other thrombophilia: Secondary | ICD-10-CM | POA: Diagnosis not present

## 2022-05-18 NOTE — Progress Notes (Signed)
Electrophysiology Office Note   Date:  05/18/2022   ID:  DELLENE MCGROARTY, DOB 27-Jul-1945, MRN [id number]  PCP:  Kathleen Grandchild, MD  Cardiologist:  Bensimhon Primary Electrophysiologist:  Kathleen [name] Kathleen Loa, MD    Chief Complaint: AF   History of Present Illness: Kathleen [name] is a 76 y.o. female who is being seen today for the evaluation of AF at the request of Kathleen Grandchild, MD. Presenting today for electrophysiology evaluation.  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  She is post PVI and CTI in 2015.  This was an unsuccessful procedure.  She underwent AV nodal ablation and pacemaker implant 11/02/2015.  She did not have an LV lead placed due to elevated thresholds.  She had return of AV node conduction requiring repeat AV node ablation she returned to the EP lab and had the device placed submuscular September 2015.  She had redo ablation in 2019.  She was started on dofetilide in 2022 with improvement in A-fib burden.  She had a left bundle lead placed 05/09/2020.  She had a recent hospitalization for an acute viral illness.  She was found to have a low ejection fraction.  She underwent left heart catheterization that showed nonobstructive coronary artery disease.  It was thought that her troponin elevation was due to her acute illness.  Today, she denies  symptoms of palpitations, chest pain, shortness of breath, orthopnea, PND, lower extremity edema, claudication, dizziness, presyncope, syncope, bleeding, or neurologic sequela. The patient is tolerating medications without difficulties.    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    Concussion 10/04/2021   COPD  (chronic obstructive pulmonary disease) (HCC)    Gout    Hypertension    Longstanding persistent atrial fibrillation (HCC)    on Xarelto   Nonischemic cardiomyopathy (HCC)    Obesity    Pacemaker    Spontaneous pneumothorax 2013   Past Surgical History:  Procedure Laterality Date   ABDOMINAL HYSTERECTOMY     APPENDECTOMY     ATRIAL FIBRILLATION ABLATION  07/20/2013   by Kathleen [name]   AV nodal ablation  11/01/2013   by Kathleen [name], repeated by Kathleen [name]   BREAST BIOPSY Bilateral 1997   negative   CARDIAC CATHETERIZATION     CHOLECYSTECTOMY     HERNIA REPAIR     PACEMAKER INSERTION  06/2017   MDT Viva CRT-P implanted by Kathleen [name] after AV nodal ablation,  LV lead could not be placed   PACEMAKER INSERTION  05/2020   with lead bundle   RIGHT/LEFT HEART CATH AND CORONARY ANGIOGRAPHY N/A 03/20/2022   Procedure: RIGHT/LEFT HEART CATH AND CORONARY ANGIOGRAPHY;  Surgeon: [place], Peter M, MD;  Location: [facility] INVASIVE CV LAB;  Service: Cardiovascular;  Laterality: N/A;     Current Outpatient Medications  Medication Sig Dispense Refill   acetaminophen (TYLENOL) 650 MG CR tablet Take 1,300 mg by mouth every 8 (eight) hours as needed for pain.     albuterol (VENTOLIN HFA) 108 (90 Base) MCG/ACT inhaler Inhale 2 puffs into the lungs every 4 (four) hours as needed for wheezing or shortness of breath. 8 g 2   azelastine (ASTELIN) 0.1 % nasal  spray Place 2 sprays into both nostrils 2 (two) times daily. 30 mL 5   Bempedoic Acid (NEXLETOL) 180 MG TABS Take 1 tablet by mouth daily. 90 tablet 0   benzonatate (TESSALON) 200 MG capsule Take 1 capsule (200 mg total) by mouth 3 (three) times daily as needed for cough. 20 capsule 0   chlorpheniramine-HYDROcodone (TUSSIONEX) 10-8 MG/5ML Take 5 mLs by mouth every 12 (twelve) hours. 70 mL 0   Cholecalciferol (VITAMIN D3) 50 MCG (2000 UT) TABS Take 2,000 Units by mouth at bedtime.     cyanocobalamin (VITAMIN B12) 1000 MCG/ML injection Inject 1 mL (1,000 mcg  total) into the muscle every 30 (thirty) days. 10 mL 0   docusate sodium (COLACE) 100 MG capsule Take 100 mg by mouth 2 (two) times daily.     dofetilide (TIKOSYN) 250 MCG capsule Take 1 capsule (250 mcg total) by mouth in the morning and at bedtime. 60 capsule 6   febuxostat (ULORIC) 40 MG tablet TAKE 1 TABLET(40 MG) BY MOUTH EVERY NIGHT (Patient taking differently: Take 40 mg by mouth at bedtime.) 90 tablet 0   fluticasone furoate-vilanterol (BREO ELLIPTA) 200-25 MCG/ACT AEPB INHALE 1 PUFF INTO THE LUNGS DAILY 180 each 3   ipratropium-albuterol (DUONEB) 0.5-2.5 (3) MG/3ML SOLN Use 1 vial (3 mLs) by nebulization every 4 (four) hours as needed. 360 mL 1   linaclotide (LINZESS) 145 MCG CAPS capsule Take 1 capsule (145 mcg total) by mouth daily as needed (constipation). 90 capsule 0   losartan (COZAAR) 25 MG tablet TAKE 1 TABLET(25 MG) BY MOUTH AT BEDTIME (Patient taking differently: Take 25 mg by mouth at bedtime.) 90 tablet 3   Magnesium 500 MG TABS Take 500 mg by mouth at bedtime.     mupirocin ointment (BACTROBAN) 2 % Apply 1 application. topically 2 (two) times daily. (Patient taking differently: Apply 1 application  topically 2 (two) times daily as needed (wound care).) 30 g 2   nystatin cream (MYCOSTATIN) Apply to affected area 2 times daily (Patient taking differently: Apply 1 Application topically 2 (two) times daily as needed for dry skin.) 90 g 1   OXYGEN Inhale 4.5 L into the lungs continuous. Use with resmed ventilator     predniSONE (DELTASONE) 10 MG tablet Take 4 tablets (40 mg) daily for 5 days, then 3 tablets (30 mg) daily for 5 days, then, 2 tablets (20 mg) daily for 5 days, then,Take 1 tablets (10 mg) daily for 5 days, then stop 50 tablet 0   revefenacin (YUPELRI) 175 MCG/3ML nebulizer solution Take 3 mLs (175 mcg total) by nebulization daily. 90 mL 11   rivaroxaban (XARELTO) 20 MG TABS tablet Take 1 tablet (20 mg total) by mouth daily with supper. 30 tablet 5   spironolactone  (ALDACTONE) 25 MG tablet TAKE 1 TABLET(25 MG) BY MOUTH EVERY MORNING (Patient taking differently: Take 25 mg by mouth daily.) 90 tablet 3   torsemide (DEMADEX) 20 MG tablet Take 20mg  (1 Tab) Monday and Friday (Patient taking differently: Take 20 mg by mouth 2 (two) times a week. Take 20mg  (1 Tab) Monday and Friday) 60 tablet 3   traMADol (ULTRAM) 50 MG tablet Take 1 tablet (50 mg total) by mouth every 6 (six) hours as needed. 360 tablet 1   nitroGLYCERIN (NITROSTAT) 0.4 MG SL tablet Place 0.4 mg under the tongue every 5 (five) minutes as needed for chest pain. Patient has for emergency use     No current facility-administered medications for this visit.  Allergies:   Allopurinol, Clindamycin, Flublok [influenza vaccine recombinant], Pneumococcal 13-val conj vacc, Dronedarone, Brovana [arformoterol], Budesonide, Entresto [sacubitril-valsartan], Fosamax [alendronate sodium], Jardiance [empagliflozin], Meperidine, Microplegia msa-msg [plegisol], Rosuvastatin, Tetracycline, Adhesive [tape], and Lovastatin   Social History:  The patient  reports that she quit smoking about 34 years ago. Her smoking use included cigarettes. She has a 20.00 pack-year smoking history. She has been exposed to tobacco smoke. She has never used smokeless tobacco. She reports that she does not drink alcohol and does not use drugs.   Family History:  The patient's family history includes Breast cancer in her cousin; Breast cancer (age of onset: 69) in her mother; Emphysema in her father; Healthy in her brother; Obesity in her daughter; Pancreatic cancer in her mother; Valvular heart disease in her son.    ROS:  Please see the history of present illness.   Otherwise, review of systems is positive for none.   All other systems are reviewed and negative.    PHYSICAL EXAM: VS:  BP 124/80   Pulse 74   Ht 5\' 1"  (1.549 [name])   Wt 184 lb (83.5 kg)   SpO2 94%   BMI 34.77 kg/[name]  , BMI Body mass index is 34.77 kg/[name]. GEN: Well  nourished, well developed, in no acute distress  HEENT: normal  Neck: no JVD, carotid bruits, or masses Cardiac: RRR; no murmurs, rubs, or gallops,no edema  Respiratory:  clear to auscultation bilaterally, normal work of breathing GI: soft, nontender, nondistended, + BS MS: no deformity or atrophy  Skin: warm and dry, device pocket is well healed Neuro:  Strength and sensation are intact Psych: euthymic mood, full affect  EKG:  EKG is ordered today. Personal review of the ekg ordered shows AV paced  Device interrogation is reviewed today in detail.  See PaceArt for details.   Recent Labs: 12/25/2021: TSH 2.949 04/28/2022: ALT 14; B Natriuretic Peptide 58.7 05/01/2022: Hemoglobin 11.7; Platelets 241 05/03/2022: BUN 22; Creatinine, Ser 0.66; Magnesium 2.0; Potassium 4.4; Sodium 137    Lipid Panel     Component Value Date/Time   CHOL 174 08/07/2021 1219   TRIG 200.0 (H) 08/07/2021 1219   HDL 35.00 (L) 08/07/2021 1219   CHOLHDL 5 08/07/2021 1219   VLDL 40.0 08/07/2021 1219   LDLCALC 99 08/07/2021 1219     Wt Readings from Last 3 Encounters:  05/18/22 184 lb (83.5 kg)  05/14/22 183 lb (83 kg)  04/28/22 182 lb 15.7 oz (83 kg)      Other studies Reviewed: Additional studies/ records that were reviewed today include: TTE 05/04/22  Review of the above records today demonstrates:   1. Left ventricular ejection fraction, by estimation, is 35 to 40%. The  left ventricle has moderately decreased function. The left ventricle  demonstrates global hypokinesis with septal-lateral dyssynchrony  consistent with RV pacing. The left ventricular   internal cavity size was mildly dilated. Left ventricular diastolic  parameters are consistent with Grade I diastolic dysfunction (impaired  relaxation).   2. Right ventricular systolic function is mildly reduced. The right  ventricular size is normal. Tricuspid regurgitation signal is inadequate  for assessing PA pressure.   3. The mitral  valve is normal in structure. Trivial mitral valve  regurgitation. No evidence of mitral stenosis.   4. The aortic valve is tricuspid. There is mild calcification of the  aortic valve. Aortic valve regurgitation is not visualized. No aortic  stenosis is present.   5. Aortic dilatation noted. There is mild dilatation  of the ascending  aorta, measuring 38 mm.   6. The inferior vena cava is normal in size with greater than 50%  respiratory variability, suggesting right atrial pressure of 3 mmHg.   7. Technically difficult study with poor acoustic windows.    ASSESSMENT AND PLAN:  1.  Persistent atrial fibrillation: Very complicated history as above.  CHA2DS2-VASc of 6.  Remains AV paced. Dofetilide to 50 mg twice daily Xarelto 20 mg daily  2.  Complete heart block: Status post AV node ablation at [facility].  Has Medtronic pacemaker.  Device functioning appropriately.  No changes at this time.  3.  Secondary hypercoagulable state: Currently on Xarelto for atrial fibrillation as above  4.  High risk medication monitoring: Currently on dofetilide as above.  QTc is remained stable.  5.  Chronic systolic heart failure: Follows with heart failure clinic.  Normovolemic on exam.  6.  Hypertension: Currently well-controlled   Current medicines are reviewed at length with the patient today.   The patient does not have concerns regarding her medicines.  The following changes were made today:  none  Labs/ tests ordered today include: none Orders Placed This Encounter  Procedures   EKG 12-Lead     Disposition:   FU 6 very loud in the office today months  Signed, Shalona Harbour Kathleen Loa, MD  05/18/2022 12:19 PM     [facility] [address] [place] [address] [phone] (office) [phone] (fax)

## 2022-05-18 NOTE — Patient Instructions (Addendum)
Client medication Instructions:  Your physician recommends that Kathleen continue on your current medications as directed. Please refer to the Current Medication list given to Kathleen today.  *If Kathleen need a refill on your cardiac medications before your next appointment, please call your pharmacy*   Lab Work: None ordered If Kathleen have labs (blood work) drawn today and your tests are completely normal, Kathleen Kathleen [name] receive your results only by: MyChart Message (if Kathleen have MyChart) OR A paper copy in the mail If Kathleen have any lab test that is abnormal or we need to change your treatment, we Kathleen [name] call Kathleen to review the results.   Testing/Procedures: None ordered   Follow-Up: At [facility], Kathleen and your health needs are our priority.  As part of our continuing mission to provide Kathleen with exceptional heart care, we have created designated Provider Care Teams.  These Care Teams include your primary Cardiologist (physician) and Advanced Practice Providers (APPs -  Physician Assistants and Nurse Practitioners) who all work together to provide Kathleen with the care Kathleen need, when Kathleen need it.  We recommend signing up for the patient portal called "MyChart".  Sign up information is provided on this After Visit Summary.  MyChart is used to connect with patients for Virtual Visits (Telemedicine).  Patients are able to view lab/test results, encounter notes, upcoming appointments, etc.  Non-urgent messages can be sent to your provider as well.   To learn more about what Kathleen can do with MyChart, go to [email or web address].    Your next appointment:   6 month(s)  The format for your next appointment:   In Person  Provider:   You Kathleen [name] follow up in the [facility] located at [facility]. Your provider Kathleen [name] be: Kathleen [name], Kathleen [name]    Your physician wants Kathleen to follow-up in: 1 year with Kathleen [name] or Kathleen [name].   Kathleen [name] receive a reminder letter in the mail two months in  advance. If Kathleen don't receive a letter, please call our office to schedule the follow-up appointment.    Thank Kathleen for choosing [facility]!!   Kathleen Horn, RN [phone]  Other Instructions    Important Information About Sugar

## 2022-05-19 ENCOUNTER — Ambulatory Visit (INDEPENDENT_AMBULATORY_CARE_PROVIDER_SITE_OTHER): Payer: Medicare Other | Admitting: Nurse Practitioner

## 2022-05-19 ENCOUNTER — Encounter: Payer: Self-pay | Admitting: Nurse Practitioner

## 2022-05-19 VITALS — BP 110/60 | HR 77 | Ht 61.0 in | Wt 185.8 lb

## 2022-05-19 DIAGNOSIS — J398 Other specified diseases of upper respiratory tract: Secondary | ICD-10-CM | POA: Diagnosis not present

## 2022-05-19 DIAGNOSIS — J441 Chronic obstructive pulmonary disease with (acute) exacerbation: Secondary | ICD-10-CM | POA: Diagnosis not present

## 2022-05-19 DIAGNOSIS — J189 Pneumonia, unspecified organism: Secondary | ICD-10-CM

## 2022-05-19 DIAGNOSIS — I5020 Unspecified systolic (congestive) heart failure: Secondary | ICD-10-CM

## 2022-05-19 DIAGNOSIS — J349 Unspecified disorder of nose and nasal sinuses: Secondary | ICD-10-CM | POA: Diagnosis not present

## 2022-05-19 DIAGNOSIS — J9611 Chronic respiratory failure with hypoxia: Secondary | ICD-10-CM

## 2022-05-19 DIAGNOSIS — I480 Paroxysmal atrial fibrillation: Secondary | ICD-10-CM

## 2022-05-19 LAB — BASIC METABOLIC PANEL
BUN: 16 mg/dL (ref 6–23)
CO2: 28 mEq/L (ref 19–32)
Calcium: 9.8 mg/dL (ref 8.4–10.5)
Chloride: 103 mEq/L (ref 96–112)
Creatinine, Ser: 0.72 mg/dL (ref 0.40–1.20)
GFR: 81.81 mL/min (ref >60.00)
Glucose, Bld: 161 mg/dL — ABNORMAL HIGH (ref 70–99)
Potassium: 4.5 mEq/L (ref 3.5–5.1)
Sodium: 140 mEq/L (ref 135–145)

## 2022-05-19 LAB — CBC
HCT: 40 % (ref 36.0–46.0)
Hemoglobin: 13.3 g/dL (ref 12.0–15.0)
MCHC: 33.2 g/dL (ref 30.0–36.0)
MCV: 83.2 fl (ref 78.0–100.0)
Platelets: 209 10*3/uL (ref 150.0–400.0)
RBC: 4.81 Mil/uL (ref 3.87–5.11)
RDW: 17.1 % — ABNORMAL HIGH (ref 11.5–15.5)
WBC: 11.1 10*3/uL — ABNORMAL HIGH (ref 4.0–10.5)

## 2022-05-19 MED ORDER — BENZONATATE 200 MG PO CAPS: 200.0000 mg | ORAL_CAPSULE | Freq: Three times a day (TID) | ORAL | 0 refills | Status: DC | PRN

## 2022-05-19 MED ORDER — FLUTICASONE PROPIONATE 50 MCG/ACT NA SUSP: 2.0000 | Freq: Every day | NASAL | 2 refills | Status: DC

## 2022-05-19 MED ORDER — PREDNISONE 10 MG PO TABS: ORAL_TABLET | ORAL | 0 refills | Status: DC

## 2022-05-19 NOTE — Assessment & Plan Note (Signed)
She has BiPAP, which she wears inconsistently. Currently having trouble due to her cough. Encouraged her to wear nightly.

## 2022-05-19 NOTE — Progress Notes (Signed)
@Patient  ID: , female    DOB: 06/19/1945, 76 y.o.   MRN: [id number]  Chief Complaint  Patient presents with   Follow-up    Referring provider: [id number], MD  HPI: 76 year old female, former smoker followed for asthma/COPD, tracheobronchomalacia, chronic respiratory failure on supplemental O2, OSA on BiPAP. She is a patient of Dr. 61 and last seen in office on 04/28/2022 by Naveh Rickles,NP. Past medical history significant for history of NSTEMI, hypertension, PAF on Xarelto, CHF, type 2 diabetes, HLD, obesity.  She was hospitalized from 03/17/2022 to 03/22/2022 for sepsis felt initially thought to be related to pneumonia.  She had a fevers up to 103 and a dry hacking cough.  CTA was negative for PE or acute airspace disease.  She was treated for AECOPD with steroids and IV antibiotics.  She was weaned to her baseline supplemental O2 and discharged on 03/24/2022.  She then contacted the office on 10/27 with recurrent fevers and increased cough.  She was advised to come in for an office visit.  She was also sent in Augmentin for 7 days.  She was seen 04/06/2022 by Dr. 04/08/2022 for acute visit.  She was advised to finish antibiotics and continue triple therapy regimen.  TEST/EVENTS:  03/18/2022 CTA chest: No evidence of PE.  Ectasia of main pulmonary artery, suggesting PAH.  Centrilobular emphysema.  Small linear densities in the lower lung fields, suggest scarring or atelectasis.  No focal pulmonary consolidation.  04/21/2022: OV with Santhiago Collingsworth NP for follow-up with her son.  Unfortunately, over the last 2 to 3 days she had recurrent fevers and has developed an increased productive cough with yellow sputum.  She feels very fatigued and rundown.  Breathing is more short than it normally is.  She also has chest congestion and wheezing.  She did have an episode of hemoptysis yesterday.  No further occurrences since.  She denies any calf pain or swelling.  She is compliant with her  Xarelto.  Has not missed any doses.  She is eating and drinking well.  She is using Breo once a day and Yupelri once a day.  Uses her albuterol rescue inhaler twice daily.  Does not have any rescue medicine for nebulizer machine.  She completed previously prescribed Augmentin around 11/3.  She increased her supplemental oxygen at home to 5 L.  Previously on 4.  Not routinely checking her oxygen levels at home.CXR with worsening RLL pna. Unable to treat her with fluoroquinolones or macrolides due to use of Tikosyn. Started on 14 day course of cefpodoxime. Pred taper for AECOPD. Sputum cultures ordered. CBC with normalized hgb and without leukocytosis.   04/28/2022: OV with Terreon Ekholm NP for follow-up.  She was seen last week with recurrent fevers and productive cough.  CXR showed worsening right lower lobe opacities.  She had increased oxygen requirements up to 5 L, previously on 4 L.  She had just completed Augmentin around 11/3 for a 7-day course.  Unfortunately, unable to treat her with flora quinolones or macrolides due to her Tikosyn use.  She was started on cefpodoxime for a 14-day course.  She is currently on day 7.  She was also treated with prednisone taper due to AECOPD.  Today, she tells me that she is not feeling any better.  She actually feels worse.  She feels significantly fatigued.  Feels like her breathing has declined.  Cough is less productive but still feels congested.  It is paroxysmal at  times.  She has been struggling to drink fluids as it is hard to breathe and drink at the same time.  She has been able to eat okay but feels like her appetite is not what it normally is.  No recurrence of fevers.  She denies any hemoptysis, lower extremity swelling, orthopnea, palpitations.  She was not able to collect sputum cultures previously ordered.  No increased oxygen demand.  She is still on the 5 L that she increased her self to last time.  She did note that her oxygen briefly dropped to 85% this morning  but she quickly recovered back up into the 90s.  Given her clinical worsening, she was advised admission to the hospital for further treatment.  05/19/2022: Today-follow-up Patient presents today for hospital follow-up.  After I saw her last, she was admitted from 04/28/2022 to 05/04/2022 for acute on chronic respiratory failure secondary to RSV pneumonia/Boop?[name]  She was initially treated with IV antibiotics which were discontinued and continued on IV steroids.  She was transition to prolonged steroid taper upon discharge.  She was also found to have chronic paranasal sinus disease, suspected to be contributing to her cough.  Today, she tells me that she is feeling better but not quite back to her baseline.  She still has a persistent cough which is quite bothersome. She feels like her cough started getting worse when she tapered down to the 20 mg of prednisone, which she has 1 day left of.  Cough is dry but feels congested.  Breathing feels relatively stable.  She does feel like she is wheezing a little bit more.  No fevers, chills, hemoptysis, increased lower extremity swelling.  Oxygen stable on 4 to 5 L/min.  Eating and drinking well.  She is using Tessalon as needed for cough and Tussionex at bedtime.  She uses her Breo once a day followed by her Yupelri.  She is also using her DuoNebs twice a day.  She is trying to be more consistent with her BiPAP at night but this is difficult with her cough.  She has a follow-up appointment with cardiology later this month.  She also has an appoint with ENT for overdue follow-up/further recommendations on the 28th.  Allergies  Allergen Reactions   Allopurinol Other (See Comments)    Reaction:  Dizziness    Clindamycin Anaphylaxis and Hives   Flublok [Influenza Vaccine Recombinant] Other (See Comments)    Fever 103 with no alternative explanation day after vaccine.  Clydie Braun Highfill FNP-C   Pneumococcal 13-Val Conj Vacc Itching, Swelling and Rash   Dronedarone  Rash   Brovana [Arformoterol]     Caused muscle pain   Budesonide     Caused extreme joint pain   Entresto [Sacubitril-Valsartan] Other (See Comments)    hypotension   Fosamax [Alendronate Sodium] Nausea Only   Jardiance [Empagliflozin]     Caused a vaginal infection   Meperidine Nausea And Vomiting   Microplegia Msa-Msg [Plegisol]     Loopy,diarrhea   Rosuvastatin Other (See Comments)    Reaction:  Muscle spasms    Tetracycline Hives   Adhesive [Tape] Rash   Lovastatin Rash and Other (See Comments)    Muscle Pain    Immunization History  Administered Date(s) Administered   Fluad Quad(high Dose 65+) 04/22/2021, 03/16/2022   Hepatitis A, Adult 04/12/2001   Influenza, High Dose Seasonal PF 03/16/2019   PFIZER Comirnaty(Gray Top)Covid-19 Tri-Sucrose Vaccine 03/29/2020, 10/09/2020   PFIZER(Purple Top)SARS-COV-2 Vaccination 07/13/2019, 08/08/2019   Pneumococcal Conjugate-13  08/30/2014   Pneumococcal Polysaccharide-23 03/31/2019   Tdap 10/02/2013   Yellow Fever 04/12/2001    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    Concussion 10/04/2021   COPD (chronic obstructive pulmonary disease) (HCC)    Gout    Hypertension    Longstanding persistent atrial fibrillation (HCC)    on Xarelto   Nonischemic cardiomyopathy (HCC)    Obesity    Pacemaker    Spontaneous pneumothorax 2013    Tobacco History: Social History   Tobacco Use  Smoking Status Former   Packs/day: 1.00   Years: 20.00   Total pack years: 20.00   Types: Cigarettes   Quit date: 06/09/1987   Years since quitting: 34.9   Passive exposure: Past  Smokeless Tobacco Never  Tobacco Comments   Former smoker 12/25/21   Counseling given: Not Answered Tobacco comments: Former smoker 12/25/21   Outpatient Medications  Prior to Visit  Medication Sig Dispense Refill   albuterol (VENTOLIN HFA) 108 (90 Base) MCG/ACT inhaler Inhale 2 puffs into the lungs every 4 (four) hours as needed for wheezing or shortness of breath. 8 g 2   azelastine (ASTELIN) 0.1 % nasal spray Place 2 sprays into both nostrils 2 (two) times daily. 30 mL 5   fluticasone furoate-vilanterol (BREO ELLIPTA) 200-25 MCG/ACT AEPB INHALE 1 PUFF INTO THE LUNGS DAILY 180 each 3   ipratropium-albuterol (DUONEB) 0.5-2.5 (3) MG/3ML SOLN Use 1 vial (3 mLs) by nebulization every 4 (four) hours as needed. 360 mL 1   OXYGEN Inhale 4.5 L into the lungs continuous. Use with resmed ventilator     revefenacin (YUPELRI) 175 MCG/3ML nebulizer solution Take 3 mLs (175 mcg total) by nebulization daily. 90 mL 11   predniSONE (DELTASONE) 10 MG tablet Take 4 tablets (40 mg) daily for 5 days, then 3 tablets (30 mg) daily for 5 days, then, 2 tablets (20 mg) daily for 5 days, then,Take 1 tablets (10 mg) daily for 5 days, then stop 50 tablet 0   acetaminophen (TYLENOL) 650 MG CR tablet Take 1,300 mg by mouth every 8 (eight) hours as needed for pain.     Bempedoic Acid (NEXLETOL) 180 MG TABS Take 1 tablet by mouth daily. 90 tablet 0   chlorpheniramine-HYDROcodone (TUSSIONEX) 10-8 MG/5ML Take 5 mLs by mouth every 12 (twelve) hours. 70 mL 0   Cholecalciferol (VITAMIN D3) 50 MCG (2000 UT) TABS Take 2,000 Units by mouth at bedtime.     cyanocobalamin (VITAMIN B12) 1000 MCG/ML injection Inject 1 mL (1,000 mcg total) into the muscle every 30 (thirty) days. 10 mL 0   docusate sodium (COLACE) 100 MG capsule Take 100 mg by mouth 2 (two) times daily.     dofetilide (TIKOSYN) 250 MCG capsule Take 1 capsule (250 mcg total) by mouth in the morning and at bedtime. 60 capsule 6   febuxostat (ULORIC) 40 MG tablet TAKE 1 TABLET(40 MG) BY MOUTH EVERY NIGHT (Patient taking differently: Take 40 mg by mouth at bedtime.) 90 tablet 0   linaclotide (LINZESS) 145 MCG CAPS capsule Take 1 capsule (145  mcg total) by mouth daily as needed (constipation). 90 capsule 0   losartan (COZAAR) 25 MG tablet TAKE 1 TABLET(25 MG) BY MOUTH AT BEDTIME (Patient taking differently: Take 25 mg by mouth at bedtime.)  90 tablet 3   Magnesium 500 MG TABS Take 500 mg by mouth at bedtime.     mupirocin ointment (BACTROBAN) 2 % Apply 1 application. topically 2 (two) times daily. (Patient taking differently: Apply 1 application  topically 2 (two) times daily as needed (wound care).) 30 g 2   nystatin cream (MYCOSTATIN) Apply to affected area 2 times daily (Patient taking differently: Apply 1 Application topically 2 (two) times daily as needed for dry skin.) 90 g 1   rivaroxaban (XARELTO) 20 MG TABS tablet Take 1 tablet (20 mg total) by mouth daily with supper. 30 tablet 5   spironolactone (ALDACTONE) 25 MG tablet TAKE 1 TABLET(25 MG) BY MOUTH EVERY MORNING (Patient taking differently: Take 25 mg by mouth daily.) 90 tablet 3   torsemide (DEMADEX) 20 MG tablet Take 20mg  (1 Tab) Monday and Friday (Patient taking differently: Take 20 mg by mouth 2 (two) times a week. Take 20mg  (1 Tab) Monday and Friday) 60 tablet 3   traMADol (ULTRAM) 50 MG tablet Take 1 tablet (50 mg total) by mouth every 6 (six) hours as needed. 360 tablet 1   benzonatate (TESSALON) 200 MG capsule Take 1 capsule (200 mg total) by mouth 3 (three) times daily as needed for cough. 20 capsule 0   No facility-administered medications prior to visit.     Review of Systems:   Constitutional: No weight loss or gain, night sweats, fevers, chills. +fatigue, lassitude. HEENT: No headaches, difficulty swallowing, tooth/dental problems, or sore throat. No sneezing, itching, ear ache. +occasional nasal congestion, post nasal drip CV:  +chest discomfort. No orthopnea, PND, swelling in lower extremities, anasarca, dizziness, palpitations, syncope Resp: +shortness of breath with exertion; congested, paroxysmal cough; wheezing; chest congestion. No hemoptysis. No chest  wall deformity GI:  No heartburn, indigestion, abdominal pain, nausea, vomiting, diarrhea, change in bowel habits, bloody stools.  GU: No dysuria, change in color of urine, urgency or frequency.  No flank pain, no hematuria  Skin: No rash, lesions, ulcerations MSK:  No joint pain or swelling.   Neuro: No dizziness or lightheadedness.  Psych: No depression or anxiety. Mood stable.     Physical Exam:  BP 110/60 (BP Location: Right Arm)   Pulse 77   Ht 5\' 1"  (1.549 m)   Wt 185 lb 12.8 oz (84.3 kg)   SpO2 96%   BMI 35.11 kg/m   GEN: Pleasant, interactive, acute on chronically-ill appearing; obese; in no acute distress HEENT:  Normocephalic and atraumatic. PERRLA. Sclera white. Nasal turbinates pink, moist and patent bilaterally. No rhinorrhea present. Oropharynx pink and moist, without exudate or edema. No lesions, ulcerations, or postnasal drip.  NECK:  Supple w/ fair ROM. No JVD present. Normal carotid impulses w/o bruits. Thyroid symmetrical with no goiter or nodules palpated. No lymphadenopathy.   CV: RRR, no m/r/g, no peripheral edema. Pulses intact, +2 bilaterally. No cyanosis, pallor or clubbing. PULMONARY: Unlabored, regular breathing.  Scattered rhonchi with end expiratory wheezes bilaterally R>L A&P. Congested cough. No accessory muscle use.  GI: BS present and normoactive. Soft, non-tender to palpation. No organomegaly or masses detected.  MSK: No erythema, warmth or tenderness. Cap refil <2 sec all extrem. No deformities or joint swelling noted.  Neuro: A/Ox3. No focal deficits noted.   Skin: Warm, no lesions or rashe Psych: Normal affect and behavior. Judgement and thought content appropriate.     Lab Results:  CBC    Component Value Date/Time   WBC 13.5 (H) 05/01/2022 0229   RBC 4.31  05/01/2022 0229   HGB 11.7 (L) 05/01/2022 0229   HGB 13.4 12/28/2012 0748   HCT 37.0 05/01/2022 0229   HCT 39.2 12/28/2012 0748   PLT 241 05/01/2022 0229   PLT 186 12/28/2012 0748    MCV 85.8 05/01/2022 0229   MCV 87 12/28/2012 0748   MCH 27.1 05/01/2022 0229   MCHC 31.6 05/01/2022 0229   RDW 14.7 05/01/2022 0229   RDW 15.0 (H) 12/28/2012 0748   LYMPHSABS 1.1 04/28/2022 1200   LYMPHSABS 0.4 (L) 12/28/2012 0748   MONOABS 0.4 04/28/2022 1200   MONOABS 0.5 12/28/2012 0748   EOSABS 0.0 04/28/2022 1200   EOSABS 0.0 12/28/2012 0748   BASOSABS 0.1 04/28/2022 1200   BASOSABS 0.0 12/28/2012 0748    BMET    Component Value Date/Time   NA 137 05/03/2022 0303   NA 130 (L) 12/28/2012 0748   K 4.4 05/03/2022 0303   K 4.6 12/28/2012 0748   CL 100 05/03/2022 0303   CL 95 (L) 12/28/2012 0748   CO2 28 05/03/2022 0303   CO2 23 12/28/2012 0748   GLUCOSE 118 (H) 05/03/2022 0303   GLUCOSE 391 (H) 12/28/2012 0748   BUN 22 05/03/2022 0303   BUN 32 (H) 12/28/2012 0748   CREATININE 0.66 05/03/2022 0303   CREATININE 1.29 12/28/2012 0748   CALCIUM 10.4 (H) 05/03/2022 0303   CALCIUM 14.3 (HH) 06/26/2019 1618   GFRNONAA >60 05/03/2022 0303   GFRNONAA 43 (L) 12/28/2012 0748   GFRAA 58 (L) 06/28/2019 0427   GFRAA 50 (L) 12/28/2012 0748    BNP    Component Value Date/Time   BNP 58.7 04/28/2022 1200     Imaging:  DG Chest 2 View  Result Date: 05/14/2022 CLINICAL DATA:  Pneumonia. EXAM: CHEST - 2 VIEW COMPARISON:  April 28, 2022. FINDINGS: Stable cardiomediastinal silhouette. Left-sided pacemaker is unchanged in position. Left lung is clear. Stable right infrahilar opacity is noted concerning for possible atelectasis or infiltrate. Bony thorax is unremarkable. IMPRESSION: Stable right infrahilar opacity is scribe above. Electronically Signed   By: Lupita Raider M.D.   On: 05/14/2022 11:33   ECHOCARDIOGRAM COMPLETE  Result Date: 05/04/2022    ECHOCARDIOGRAM REPORT   Patient Name:   Kathleen [name] Date of Exam: 05/04/2022 Medical Rec #:  [id number]       Height: Accession #:    [id number]      Weight: Date of Birth:  1946-05-25       BSA: Patient Age:    76 years         BP:           152/85 mmHg Patient Gender: F               HR:           73 bpm. Exam Location:  Inpatient Procedure: 2D Echo, Cardiac Doppler, Color Doppler and Intracardiac            Opacification Agent Indications:    CHF-Acute Diastolic I50.31  History:        Patient has prior history of Echocardiogram examinations, most                 recent 03/19/2022. Cardiomyopathy and CHF, NSTEMMi, Pacemaker,                 Arrythmias:Atrial Fibrillation, Signs/Symptoms:Hypotension; Risk                 Factors:Hypertension, Sleep Apnea, Diabetes, Dyslipidemia and  Former Smoker.  Sonographer:    Aron Baba Referring Phys: [id number] DANIEL R BENSIMHON  Sonographer Comments: Technically challenging study due to limited acoustic windows. Image acquisition challenging due to patient body habitus, Image acquisition challenging due to COPD and Image acquisition challenging due to respiratory motion. Patient  not able to lie flat due to breathing. IMPRESSIONS  1. Left ventricular ejection fraction, by estimation, is 35 to 40%. The left ventricle has moderately decreased function. The left ventricle demonstrates global hypokinesis with septal-lateral dyssynchrony consistent with RV pacing. The left ventricular  internal cavity size was mildly dilated. Left ventricular diastolic parameters are consistent with Grade I diastolic dysfunction (impaired relaxation).  2. Right ventricular systolic function is mildly reduced. The right ventricular size is normal. Tricuspid regurgitation signal is inadequate for assessing PA pressure.  3. The mitral valve is normal in structure. Trivial mitral valve regurgitation. No evidence of mitral stenosis.  4. The aortic valve is tricuspid. There is mild calcification of the aortic valve. Aortic valve regurgitation is not visualized. No aortic stenosis is present.  5. Aortic dilatation noted. There is mild dilatation of the ascending aorta, measuring 38 mm.  6. The inferior vena cava  is normal in size with greater than 50% respiratory variability, suggesting right atrial pressure of 3 mmHg.  7. Technically difficult study with poor acoustic windows. FINDINGS  Left Ventricle: Left ventricular ejection fraction, by estimation, is 35 to 40%. The left ventricle has moderately decreased function. The left ventricle demonstrates global hypokinesis. Definity contrast agent was given IV to delineate the left ventricular endocardial borders. The left ventricular internal cavity size was mildly dilated. There is no left ventricular hypertrophy. Left ventricular diastolic parameters are consistent with Grade I diastolic dysfunction (impaired relaxation). Right Ventricle: The right ventricular size is normal. No increase in right ventricular wall thickness. Right ventricular systolic function is mildly reduced. Tricuspid regurgitation signal is inadequate for assessing PA pressure. Left Atrium: Left atrial size was normal in size. Right Atrium: Right atrial size was normal in size. Pericardium: Trivial pericardial effusion is present. Mitral Valve: The mitral valve is normal in structure. Trivial mitral valve regurgitation. No evidence of mitral valve stenosis. Tricuspid Valve: The tricuspid valve is normal in structure. Tricuspid valve regurgitation is not demonstrated. Aortic Valve: The aortic valve is tricuspid. There is mild calcification of the aortic valve. Aortic valve regurgitation is not visualized. No aortic stenosis is present. Pulmonic Valve: The pulmonic valve was normal in structure. Pulmonic valve regurgitation is trivial. Aorta: The aortic root is normal in size and structure and aortic dilatation noted. There is mild dilatation of the ascending aorta, measuring 38 mm. Venous: The inferior vena cava is normal in size with greater than 50% respiratory variability, suggesting right atrial pressure of 3 mmHg. IAS/Shunts: No atrial level shunt detected by color flow Doppler. Additional Comments:  A device lead is visualized in the right ventricle. Dalton [employer] Electronically signed by Wilfred Lacy Signature Date/Time: 05/04/2022/1:18:06 PM    Final    CT MAXILLOFACIAL WO CONTRAST  Result Date: 04/28/2022 CLINICAL DATA:  Head trauma, abnormal mental status. Sinusitis. Trauma to left face a nasal passage in the past. EXAM: CT MAXILLOFACIAL WITHOUT CONTRAST TECHNIQUE: Multidetector CT imaging of the maxillofacial structures was performed. Multiplanar CT image reconstructions were also generated. RADIATION DOSE REDUCTION: This exam was performed according to the departmental dose-optimization program which includes automated exposure control, adjustment of the mA and/or kV according to patient size and/or use of iterative reconstruction technique. COMPARISON:  03/20/2022. FINDINGS: Osseous: No fracture or mandibular dislocation. No destructive process. Orbits: Negative. No traumatic or inflammatory finding. Sinuses: There is partial opacification of the right frontal sinus, bilateral ethmoid air cells, bilateral sphenoid and maxillary sinuses. Soft tissues: No significant soft tissue abnormality. Limited intracranial: No acute abnormality.  Atrophy is noted. IMPRESSION: 1. No evidence of acute fracture. 2. Diffuse paranasal sinus disease. Electronically Signed   By: Thornell Sartorius M.D.   On: 04/28/2022 23:49   DG Chest 2 View  Result Date: 04/28/2022 CLINICAL DATA:  RLL pna; worsening dyspnea/cough despite abx therapy EXAM: CHEST - 2 VIEW COMPARISON:  Radiograph 06/21/2021 FINDINGS: Unchanged cardiomediastinal silhouette and pacemaker leads. Unchanged CardioMEMS device. There is no focal airspace consolidation. Slight improvement in right lower lung airspace disease. Blunted costophrenic sulci suggesting trace effusions. No evidence of pneumothorax. No acute osseous abnormality. Thoracic spondylosis. IMPRESSION: Slight improvement right lower lung airspace disease. Blunted costophrenic sulci  suggesting trace effusions. Electronically Signed   By: Caprice Renshaw M.D.   On: 04/28/2022 10:20   DG Chest 2 View  Result Date: 04/21/2022 CLINICAL DATA:  Productive cough EXAM: CHEST - 2 VIEW COMPARISON:  Chest radiograph 03/17/2022, CTA chest 03/18/2022 FINDINGS: The left chest wall cardiac device and associated leads are stable. The cardiomediastinal silhouette is stable with unchanged mild cardiomegaly. Lungs remain hyperinflated with flattening of the diaphragms consistent with underlying COPD. There are patchy opacities in the right lower lobe which are increased from the prior study. There is no other focal airspace disease. There is no pulmonary edema. There is no pleural effusion or pneumothorax There is no acute osseous abnormality. IMPRESSION: Patchy opacities in the right lower lobe suspicious for pneumonia superimposed on background COPD. Recommend follow-up radiographs in 3-4 weeks to ensure resolution. Electronically Signed   By: Lesia Hausen M.D.   On: 04/21/2022 12:20    albuterol (PROVENTIL) (2.5 MG/3ML) 0.083% nebulizer solution 2.5 mg     Date Action Dose Route User   Discharged on 05/04/2022   Admitted on 04/28/2022   04/28/2022 1015 Given 2.5 mg Nebulization Jaynee Eagles, CMA      methylPREDNISolone acetate (DEPO-MEDROL) injection 80 mg     Date Action Dose Route User   Discharged on 05/04/2022   Admitted on 04/28/2022   04/21/2022 1230 Given 80 mg Intramuscular (Left Upper Outer Quadrant) Charlott Holler, RN           No data to display          No results found for: "NITRICOXIDE"      Assessment & Plan:   COPD with acute exacerbation (HCC) Unresolved AECOPD secondary to RSV pneumonia/BOOP.  She is slow to improve.  Recent chest x-ray showed persistent but stable right lower lobe opacity.  Left lung was clear.  Was doing better on higher doses of steroids.  She does have bronchospasm on exam today.  We are going to treat her with Depo injection and  stop her back up to 30 mg prednisone for the next 5 days with plans to taper down to 5 mg, which is where she will remain until she is seen back in office.  She is a diabetic but not currently on therapy; check BMET today. May have to consider addition of short term insulin if BS remains elevated. Advised that I think we should hold off on any further antibiotic therapy unless she develops new/worsening infectious symptoms.  Do suspect a component of her cough is related to airway irritation.  Target cough control measures and sinus symptoms.  She will continue on triple therapy regimen.  Strict return precautions.  Patient Instructions  Continue Breo 1 puff daily. Brush tongue and rinse mouth afterwards Continue Yupelri 1 vial once daily  Continue Albuterol inhaler 2 puffs or 3 mL neb every 6 hours as needed for shortness of breath or wheezing. Notify if symptoms persist despite rescue inhaler/neb use. Use neb twice daily  Continue astelin 2 sprays each nostril Twice daily  Continue saline nasal irrigations 1-2 times a day  Continue benzonatate 1 capsule Three times a day for cough. Use on a scheduled basis for the next week Continue Tussionex 5 mL every 12 hours for cough Continue supplemental oxygen 4-5 lpm to maintain saturations >88-90% Continue BiPAP nightly, minimum of 4-6 hours  Use your sinus rinse followed by flonase and astelin nasal sprays about 15-30 minutes afterwards.    Increase prednisone back to 30 mg for 5 days then 20 mg daily for 5 days then 10 mg for 5 days then 5 mg until you are seen back. If your cough/breathing worsens with taper, please call me so we can see you back sooner Guaifenesin 600 mg Twice daily for chest congestion Flutter valve 2-3 times a day following breathing treatment  Labs today - CBC, BMET  Follow up with [facility], Nose and Throat as scheduled - [phone] call to find out what time your appt is    Follow up in 4 weeks with repeat imaging with Dr. Vassie Loll  (1st) or Katie Dontrell Stuck,NP. If symptoms do not improve or worsen, please contact office for sooner follow up or seek emergency care   Pneumonia See above. Recheck CBC today to ensure she has not developed a worsening leukocytosis. She is on prednisone so we did discuss that if her WBC remains elevated it is likely related to this and we would recheck once she's off.   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 Currently having trouble due to her cough. Encouraged her to wear nightly.   Chronic respiratory failure with hypoxia (HCC) Stable without increased O2 requirement. Goal >88-90%  Heart failure with reduced ejection fraction (HCC) Euvolemic on exam. Follow up with cardiology as scheduled.   Atrial fibrillation (HCC) In NSR today. She is on Tikosyn, which limits our choices of treatment options for recurrent AECOPD. Follow up with cardiology as scheduled.   Paranasal sinus disease Diffuse disease on imaging. Likely contributing to cough. Add on fluticasone nasal spray given persistent symptoms. See above plan.    I spent 45 minutes of dedicated to the care of this patient on the date of this encounter to include pre-visit review of records, face-to-face time with the patient discussing conditions above, post visit ordering of testing, clinical documentation with the electronic health record, making appropriate referrals as documented, and communicating necessary findings to members of the patients care team.  Noemi Chapel, NP 05/19/2022  Pt aware and understands NP's role.

## 2022-05-19 NOTE — Assessment & Plan Note (Signed)
Euvolemic on exam. Follow up with cardiology as scheduled 

## 2022-05-19 NOTE — Assessment & Plan Note (Addendum)
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  She is slow to improve.  Recent chest x-ray showed persistent but stable right lower lobe opacity.  Left lung was clear.  Was doing better on higher doses of steroids.  She does have bronchospasm on exam today.  We are going to treat her with Depo injection and stop her back up to 30 mg prednisone for the next 5 days with plans to taper down to 5 mg, which is where she will remain until she is seen back in office.  She is a diabetic but not currently on therapy; check BMET today. May have to consider addition of short term insulin if BS remains elevated. Advised that I think we should hold off on any further antibiotic therapy unless she develops new/worsening infectious symptoms.  Do suspect a component of her cough is related to airway irritation.  Target cough control measures and sinus symptoms.  She will continue on triple therapy regimen.  Strict return precautions.  Patient Instructions  Continue Breo 1 puff daily. Brush tongue and rinse mouth afterwards Continue Yupelri 1 vial once daily  Continue Albuterol inhaler 2 puffs or 3 mL neb every 6 hours as needed for shortness of breath or wheezing. Notify if symptoms persist despite rescue inhaler/neb use. Use neb twice daily  Continue astelin 2 sprays each nostril Twice daily  Continue saline nasal irrigations 1-2 times a day  Continue benzonatate 1 capsule Three times a day for cough. Use on a scheduled basis for the next week Continue Tussionex 5 mL every 12 hours for cough Continue supplemental oxygen 4-5 lpm to maintain saturations >88-90% Continue BiPAP nightly, minimum of 4-6 hours  Use your sinus rinse followed by flonase and astelin nasal sprays about 15-30 minutes afterwards.    Increase prednisone back to 30 mg for 5 days then 20 mg daily for 5 days then 10 mg for 5 days then 5 mg until you are seen back. If your cough/breathing worsens with taper, please call me so we can see you back  sooner Guaifenesin 600 mg Twice daily for chest congestion Flutter valve 2-3 times a day following breathing treatment  Labs today - CBC, BMET  Follow up with Ear, Nose and Throat as scheduled - [phone] call to find out what time your appt is    Follow up in 4 weeks with repeat imaging with Dr. Vassie Loll (1st) or Katie Sebastiano Luecke,NP. If symptoms do not improve or worsen, please contact office for sooner follow up or seek emergency care

## 2022-05-19 NOTE — Assessment & Plan Note (Signed)
In NSR today. She is on Tikosyn, which limits our choices of treatment options for recurrent AECOPD. Follow up with cardiology as scheduled.

## 2022-05-19 NOTE — Patient Instructions (Addendum)
Continue Breo 1 puff daily. Brush tongue and rinse mouth afterwards Continue Yupelri 1 vial once daily  Continue Albuterol inhaler 2 puffs or 3 mL neb every 6 hours as needed for shortness of breath or wheezing. Notify if symptoms persist despite rescue inhaler/neb use. Use neb twice daily  Continue astelin 2 sprays each nostril Twice daily  Continue saline nasal irrigations 1-2 times a day  Continue benzonatate 1 capsule Three times a day for cough. Use on a scheduled basis for the next week Continue Tussionex 5 mL every 12 hours for cough Continue supplemental oxygen 4-5 lpm to maintain saturations >88-90% Continue BiPAP nightly, minimum of 4-6 hours  Use your sinus rinse followed by flonase and astelin nasal sprays about 15-30 minutes afterwards.    Increase prednisone back to 30 mg for 5 days then 20 mg daily for 5 days then 10 mg for 5 days then 5 mg until you are seen back. If your cough/breathing worsens with taper, please call me so we can see you back sooner Guaifenesin 600 mg Twice daily for chest congestion Flutter valve 2-3 times a day following breathing treatment  Labs today - CBC, BMET  Follow up with Ear, Nose and Throat as scheduled - [phone] call to find out what time your appt is    Follow up in 4 weeks with repeat imaging with Dr. Vassie Loll (1st) or Katie Lelan Cush,NP. If symptoms do not improve or worsen, please contact office for sooner follow up or seek emergency care

## 2022-05-19 NOTE — Assessment & Plan Note (Addendum)
See above. Recheck CBC today to ensure she has not developed a worsening leukocytosis. She is on prednisone so we did discuss that if her WBC remains elevated it is likely related to this and we would recheck once she's off.

## 2022-05-19 NOTE — Assessment & Plan Note (Signed)
Stable without increased O2 requirement. Goal >88-90% 

## 2022-05-19 NOTE — Assessment & Plan Note (Signed)
Diffuse disease on imaging. Likely contributing to cough. Add on fluticasone nasal spray given persistent symptoms. See above plan.

## 2022-05-21 ENCOUNTER — Ambulatory Visit (INDEPENDENT_AMBULATORY_CARE_PROVIDER_SITE_OTHER): Payer: Medicare Other | Admitting: *Deleted

## 2022-05-21 DIAGNOSIS — Z0000 Encounter for general adult medical examination without abnormal findings: Secondary | ICD-10-CM

## 2022-05-21 NOTE — Progress Notes (Signed)
Subjective:   Kathleen [name] is a 76 y.o. female who presents for an Initial Medicare Annual Wellness Visit. I connected with  [name] on 05/21/22 by a audio enabled telemedicine application and verified that I am speaking with the correct person using two identifiers.  Patient Location: Home  Provider Location: Home Office  I discussed the limitations of evaluation and management by telemedicine. The patient expressed understanding and agreed to proceed.  Review of Systems    Deferred to PCP Cardiac Risk Factors include: diabetes mellitus;dyslipidemia;hypertension;sedentary lifestyle;obesity (BMI >30kg/m2)     Objective:    Today's Vitals   05/21/22 1244  PainSc: 3    There is no height or weight on file to calculate BMI.     05/21/2022    1:05 PM 04/30/2022    8:00 AM 04/29/2022    7:46 PM 04/29/2022    5:00 PM 04/29/2022    7:39 AM 03/17/2022    6:34 PM 10/04/2021    6:28 PM  Advanced Directives  Does Patient Have a Medical Advance Directive? Yes   Yes Yes Yes Yes  Type of Estate agent of [place];Living will Living will    Healthcare Power of [place];Living will Living will;Healthcare Power of Attorney  Does patient want to make changes to medical advance directive? No - Patient declined No - Patient declined No - Patient declined No - Patient declined No - Patient declined    Copy of Healthcare Power of Attorney in Chart? No - copy requested No - copy requested         Current Medications (verified) Outpatient Encounter Medications as of 05/21/2022  Medication Sig   acetaminophen (TYLENOL) 650 MG CR tablet Take 1,300 mg by mouth every 8 (eight) hours as needed for pain.   albuterol (VENTOLIN HFA) 108 (90 Base) MCG/ACT inhaler Inhale 2 puffs into the lungs every 4 (four) hours as needed for wheezing or shortness of breath.   azelastine (ASTELIN) 0.1 % nasal spray Place 2 sprays into both nostrils 2 (two) times daily.   Bempedoic Acid  (NEXLETOL) 180 MG TABS Take 1 tablet by mouth daily.   benzonatate (TESSALON) 200 MG capsule Take 1 capsule (200 mg total) by mouth 3 (three) times daily as needed for cough.   chlorpheniramine-HYDROcodone (TUSSIONEX) 10-8 MG/5ML Take 5 mLs by mouth every 12 (twelve) hours.   Cholecalciferol (VITAMIN D3) 50 MCG (2000 UT) TABS Take 2,000 Units by mouth at bedtime.   cyanocobalamin (VITAMIN B12) 1000 MCG/ML injection Inject 1 mL (1,000 mcg total) into the muscle every 30 (thirty) days.   docusate sodium (COLACE) 100 MG capsule Take 100 mg by mouth 2 (two) times daily.   dofetilide (TIKOSYN) 250 MCG capsule Take 1 capsule (250 mcg total) by mouth in the morning and at bedtime.   febuxostat (ULORIC) 40 MG tablet TAKE 1 TABLET(40 MG) BY MOUTH EVERY NIGHT (Patient taking differently: Take 40 mg by mouth at bedtime.)   fluticasone (FLONASE) 50 MCG/ACT nasal spray Place 2 sprays into both nostrils daily.   fluticasone furoate-vilanterol (BREO ELLIPTA) 200-25 MCG/ACT AEPB INHALE 1 PUFF INTO THE LUNGS DAILY   ipratropium-albuterol (DUONEB) 0.5-2.5 (3) MG/3ML SOLN Use 1 vial (3 mLs) by nebulization every 4 (four) hours as needed.   linaclotide (LINZESS) 145 MCG CAPS capsule Take 1 capsule (145 mcg total) by mouth daily as needed (constipation).   losartan (COZAAR) 25 MG tablet TAKE 1 TABLET(25 MG) BY MOUTH AT BEDTIME (Patient taking differently: Take 25 mg  by mouth at bedtime.)   Magnesium 500 MG TABS Take 500 mg by mouth at bedtime.   mupirocin ointment (BACTROBAN) 2 % Apply 1 application. topically 2 (two) times daily. (Patient taking differently: Apply 1 application  topically 2 (two) times daily as needed (wound care).)   nystatin cream (MYCOSTATIN) Apply to affected area 2 times daily (Patient taking differently: Apply 1 Application topically 2 (two) times daily as needed for dry skin.)   OXYGEN Inhale 4.5 L into the lungs continuous. Use with resmed ventilator   predniSONE (DELTASONE) 10 MG tablet Take 3  tabs for 5 days then 2 tabs daily for 5 days then 1 tab for 5 days then 1/2 tablet daily until you are seen back   revefenacin (YUPELRI) 175 MCG/3ML nebulizer solution Take 3 mLs (175 mcg total) by nebulization daily.   rivaroxaban (XARELTO) 20 MG TABS tablet Take 1 tablet (20 mg total) by mouth daily with supper.   spironolactone (ALDACTONE) 25 MG tablet TAKE 1 TABLET(25 MG) BY MOUTH EVERY MORNING (Patient taking differently: Take 25 mg by mouth daily.)   torsemide (DEMADEX) 20 MG tablet Take 20mg  (1 Tab) Monday and Friday (Patient taking differently: Take 20 mg by mouth 2 (two) times a week. Take 20mg  (1 Tab) Monday and Friday)   traMADol (ULTRAM) 50 MG tablet Take 1 tablet (50 mg total) by mouth every 6 (six) hours as needed.   No facility-administered encounter medications on file as of 05/21/2022.    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    Concussion 10/04/2021   COPD (chronic obstructive pulmonary disease) (HCC)    Gout    Hypertension    Longstanding persistent atrial fibrillation (HCC)    on Xarelto   Nonischemic cardiomyopathy (HCC)    Obesity    Pacemaker    Spontaneous pneumothorax 2013   Past Surgical History:  Procedure Laterality Date   ABDOMINAL HYSTERECTOMY     APPENDECTOMY     ATRIAL FIBRILLATION ABLATION  07/20/2013   by Dr 2014   AV nodal ablation  11/01/2013   by Dr Christin Fudge, repeated by Dr  11/03/2013   BREAST BIOPSY Bilateral 1997   negative   CARDIAC CATHETERIZATION     CHOLECYSTECTOMY     HERNIA REPAIR     PACEMAKER INSERTION  06/2017   MDT Viva CRT-P implanted by Dr Wilford Grist after AV nodal ablation,  LV lead could not be placed   PACEMAKER INSERTION  05/2020   with lead bundle   RIGHT/LEFT HEART CATH AND CORONARY ANGIOGRAPHY N/A 03/20/2022   Procedure: RIGHT/LEFT HEART CATH AND CORONARY ANGIOGRAPHY;  Surgeon: 06/2020, Peter M, MD;  Location: [facility] INVASIVE CV LAB;  Service: Cardiovascular;  Laterality: N/A;   Family History  Problem Relation Age of Onset   Breast cancer Mother 61       3 different times   Pancreatic cancer Mother    Emphysema Father    Healthy Brother    Breast cancer Cousin    Valvular heart disease Son    Obesity Daughter    Colon cancer  Neg Hx    Esophageal cancer Neg Hx    Stomach cancer Neg Hx    Inflammatory bowel disease Neg Hx    Liver disease Neg Hx    Rectal cancer Neg Hx    Social History   Socioeconomic History   Marital status: Widowed    Spouse name: Not on file   Number of children: 1   Years of education: Not on file   Highest education level: Not on file  Occupational History   Occupation: retired  Tobacco Use   Smoking status: Former    Packs/day: 1.00    Years: 20.00    Total pack years: 20.00    Types: Cigarettes    Quit date: 06/09/1987    Years since quitting: 34.9    Passive exposure: Past   Smokeless tobacco: Never   Tobacco comments:    Former smoker 12/25/21  Vaping Use   Vaping Use: Never used  Substance and Sexual Activity   Alcohol use: No    Alcohol/week: 0.0 standard drinks of alcohol   Drug use: No   Sexual activity: Not Currently  Other Topics Concern   Not on file  Social History Narrative   Pt lives in [place] alone.  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  Her son works in [employer].   Social Determinants of Health   Financial Resource Strain: Low Risk  (05/21/2022)   Overall  Financial Resource Strain (CARDIA)    Difficulty of Paying Living Expenses: Not very hard  Food Insecurity: No Food Insecurity (05/21/2022)   Hunger Vital Sign    Worried About Running Out of Food in the Last Year: Never true    Ran Out of Food in the Last Year: Never true  Transportation Needs: No Transportation Needs (05/21/2022)   PRAPARE - Administrator, Civil Service (Medical): No    Lack of Transportation (Non-Medical): No  Physical Activity: Insufficiently Active (05/21/2022)   Exercise Vital Sign    Days of Exercise per Week: 2 days    Minutes of Exercise per Session: 20 min  Stress: No Stress Concern Present (05/21/2022)   [employer] of Occupational Health - Occupational Stress Questionnaire    Feeling of Stress : Only a little  Social Connections: Moderately Isolated (05/21/2022)   Social Connection and Isolation Panel [NHANES]    Frequency of Communication with Friends and Family: More than three times a week    Frequency of Social Gatherings with Friends and Family: More than three times a week    Attends Religious Services: Never    Database administrator or Organizations: Yes    Attends Engineer, structural: More than 4 times per year    Marital Status: Widowed    Tobacco Counseling Counseling given: Not Answered Tobacco comments: Former smoker 12/25/21   Clinical Intake:  Pre-visit preparation completed: Yes  Pain : 0-10 Pain Score: 3  Pain Type: Chronic pain Pain Location: Generalized Pain Descriptors / Indicators: Aching, Discomfort Pain Relieving Factors: medication  Pain Relieving Factors: medication  Nutritional Status: BMI > 30  Obese Nutritional Risks: None Diabetes: Yes CBG done?: No Did pt. bring in CBG monitor from home?: No  How often do you need to have someone help you when you read instructions, pamphlets, or other written materials from your doctor or pharmacy?: 1 - Never  Diabetic?Yes Nutrition Risk  Assessment:  Has the patient had any N/V/D within the last 2 months?  No  Does the patient have any non-healing wounds?  No  Has the patient had any unintentional weight loss or weight gain?  No   Diabetes:  Is the patient diabetic?  Yes  If diabetic, was a CBG obtained today?  No  Did the patient bring in their glucometer from home?  No  How often do you monitor your CBG's? Reports does not take blood sugar at home.   Financial Strains and Diabetes Management:  Are you having any financial strains with the device, your supplies or your medication? No .  Does the patient want to be seen by Chronic Care Management for management of their diabetes?  No  Would the patient like to be referred to a Nutritionist or for Diabetic Management?  No   Diabetic Exams:  Diabetic Eye Exam: Overdue for diabetic eye exam. Pt has been advised about the importance in completing this exam. Patient advised to call and schedule an eye exam. Diabetic Foot Exam: Overdue, Pt has been advised about the importance in completing this exam. Pt is scheduled for diabetic foot exam on deferred to PCP.   Interpreter Needed?: No  Information entered by :: Blanchie Serve RN   Activities of Daily Living    05/21/2022    1:00 PM 04/29/2022    5:00 PM  In your present state of health, do you have any difficulty performing the following activities:  Hearing? 0 0  Vision? 0 0  Difficulty concentrating or making decisions? 0 0  Walking or climbing stairs? 0 1  Dressing or bathing? 0 1  Doing errands, shopping? 0 1  Preparing Food and eating ? N   Using the Toilet? N   In the past six months, have you accidently leaked urine? N   Do you have problems with loss of bowel control? N   Managing your Medications? N   Managing your Finances? N   Housekeeping or managing your Housekeeping? Y   Comment family assist: resides at independent living facility     Patient Care Team: [name], MD as PCP - General  (Internal Medicine) Bensimhon, [name], MD as PCP - Advanced Heart Failure (Cardiology) Regan Lemming, MD as PCP - Electrophysiology (Cardiology)  Indicate any recent Medical Services you may have received from other than [facility] providers in the past year (date may be approximate).     Assessment:   This is a routine wellness examination for Jaime.  Hearing/Vision screen No results found.  Dietary issues and exercise activities discussed: Current Exercise Habits: Structured exercise class, Type of exercise: Other - see comments (PT 2 times a week), Time (Minutes): 20, Frequency (Times/Week): 3, Weekly Exercise (Minutes/Week): 60, Intensity: Mild, Exercise limited by: orthopedic condition(s)   Goals Addressed   None   Depression Screen    05/21/2022   12:53 PM 05/14/2022   10:44 AM 01/22/2021    2:51 PM 04/05/2018    3:05 PM  PHQ 2/9 Scores  PHQ - 2 Score 1 0 0 0  PHQ- 9 Score  0      Fall Risk    05/21/2022    1:12 PM 05/14/2022   10:44 AM 01/22/2021    2:50 PM 01/25/2019   10:03 AM 07/26/2018   10:55 AM  Fall Risk   Falls in the past year? 1 0 0 0 0  Number falls in past yr: 0 0 0 0   Injury with Fall? 1 0 0 0   Risk for fall  due to : History of fall(s);Impaired balance/gait;Impaired mobility No Fall Risks     Follow up Falls evaluation completed Falls evaluation completed   Falls evaluation completed    FALL RISK PREVENTION PERTAINING TO THE HOME:  Any stairs in or around the home? No  If so, are there any without handrails?  N/A Home free of loose throw rugs in walkways, pet beds, electrical cords, etc? Yes  Adequate lighting in your home to reduce risk of falls? Yes   ASSISTIVE DEVICES UTILIZED TO PREVENT FALLS:  Life alert? Yes  Use of a cane, walker or w/c? Yes  Grab bars in the bathroom? Yes  Shower chair or bench in shower? Yes  Elevated toilet seat or a handicapped toilet? Yes   Cognitive Function:        05/21/2022    1:04 PM  6CIT Screen   What Year? 0 points  What month? 0 points  What time? 0 points  Count back from 20 0 points  Months in reverse 0 points  Repeat phrase 0 points  Total Score 0 points    Immunizations Immunization History  Administered Date(s) Administered   Fluad Quad(high Dose 65+) 04/22/2021, 03/16/2022   Hepatitis A, Adult 04/12/2001   Influenza, High Dose Seasonal PF 03/16/2019   PFIZER Comirnaty(Gray Top)Covid-19 Tri-Sucrose Vaccine 03/29/2020, 10/09/2020   PFIZER(Purple Top)SARS-COV-2 Vaccination 07/13/2019, 08/08/2019   Pneumococcal Conjugate-13 08/30/2014   Pneumococcal Polysaccharide-23 03/31/2019   Tdap 10/02/2013   Yellow Fever 04/12/2001    TDAP status: Up to date  Flu Vaccine status: Up to date  Pneumococcal vaccine status: Up to date  Covid-19 vaccine status: Information provided on how to obtain vaccines.   Qualifies for Shingles Vaccine? Yes   Zostavax completed No   Shingrix Completed?: No.    Education has been provided regarding the importance of this vaccine. Patient has been advised to call insurance company to determine out of pocket expense if they have not yet received this vaccine. Advised may also receive vaccine at local pharmacy or Health Dept. Verbalized acceptance and understanding.  Screening Tests Health Maintenance  Topic Date Due   OPHTHALMOLOGY EXAM  Never done   Hepatitis C Screening  Never done   Zoster Vaccines- Shingrix (1 of 2) Never done   COLONOSCOPY (Pts 45-35yrs Insurance coverage will need to be confirmed)  Never done   DEXA SCAN  Never done   COVID-19 Vaccine (5 - 2023-24 season) 02/06/2022   FOOT EXAM  04/23/2022   HEMOGLOBIN A1C  10/29/2022   DTaP/Tdap/Td (2 - Td or Tdap) 10/03/2023   Pneumonia Vaccine 7+ Years old  Completed   INFLUENZA VACCINE  Completed   HPV VACCINES  Aged Out    Health Maintenance  Health Maintenance Due  Topic Date Due   OPHTHALMOLOGY EXAM  Never done   Hepatitis C Screening  Never done   Zoster  Vaccines- Shingrix (1 of 2) Never done   COLONOSCOPY (Pts 45-6yrs Insurance coverage will need to be confirmed)  Never done   DEXA SCAN  Never done   COVID-19 Vaccine (5 - 2023-24 season) 02/06/2022   FOOT EXAM  04/23/2022    Colorectal cancer screening: No longer required.   Mammogram status: No longer required due to age.  Lung Cancer Screening: (Low Dose CT Chest recommended if Age 68-80 years, 30 pack-year currently smoking OR have quit w/in 15years.) does not qualify.   Additional Screening:  Hepatitis C Screening: does qualify; Completed education provided  Vision Screening: Recommended annual  ophthalmology exams for early detection of glaucoma and other disorders of the eye. Is the patient up to date with their annual eye exam?  Yes  Who is the provider or what is the name of the office in which the patient attends annual eye exams? Dr. Elmer Picker If pt is not established with a provider, would they like to be referred to a provider to establish care?  N/A .   Dental Screening: Recommended annual dental exams for proper oral hygiene  Community Resource Referral / Chronic Care Management: CRR required this visit?  No   CCM required this visit?  No      Plan:     I have personally reviewed and noted the following in the patient's chart:   Medical and social history Use of alcohol, tobacco or illicit drugs  Current medications and supplements including opioid prescriptions. Patient is currently taking opioid prescriptions. Information provided to patient regarding non-opioid alternatives. Patient advised to discuss non-opioid treatment plan with their provider. Functional ability and status Nutritional status Physical activity Advanced directives List of other physicians Hospitalizations, surgeries, and ER visits in previous 12 months Vitals Screenings to include cognitive, depression, and falls Referrals and appointments  In addition, I have reviewed and discussed  with patient certain preventive protocols, quality metrics, and best practice recommendations. A written personalized care plan for preventive services as well as general preventive health recommendations were provided to patient.     Wanda Plump, RN   05/21/2022   Nurse Notes:  Ms. Fedorchak , Thank you for taking time to come for your Medicare Wellness Visit. I appreciate your ongoing commitment to your health goals. Please review the following plan we discussed and let me know if I can assist you in the future.   These are the goals we discussed:  Goals   None     This is a list of the screening recommended for you and due dates:  Health Maintenance  Topic Date Due   Eye exam for diabetics  Never done   Hepatitis C Screening: USPSTF Recommendation to screen - Ages 17-79 yo.  Never done   Zoster (Shingles) Vaccine (1 of 2) Never done   Colon Cancer Screening  Never done   DEXA scan (bone density measurement)  Never done   COVID-19 Vaccine (5 - 2023-24 season) 02/06/2022   Complete foot exam   04/23/2022   Hemoglobin A1C  10/29/2022   DTaP/Tdap/Td vaccine (2 - Td or Tdap) 10/03/2023   Pneumonia Vaccine  Completed   Flu Shot  Completed   HPV Vaccine  Aged Out

## 2022-05-21 NOTE — Patient Instructions (Signed)

## 2022-05-22 ENCOUNTER — Other Ambulatory Visit: Payer: Self-pay | Admitting: Family Medicine

## 2022-05-22 ENCOUNTER — Ambulatory Visit (INDEPENDENT_AMBULATORY_CARE_PROVIDER_SITE_OTHER): Payer: Medicare Other | Admitting: Family Medicine

## 2022-05-22 ENCOUNTER — Ambulatory Visit (INDEPENDENT_AMBULATORY_CARE_PROVIDER_SITE_OTHER): Payer: Medicare Other

## 2022-05-22 ENCOUNTER — Encounter: Payer: Self-pay | Admitting: Family Medicine

## 2022-05-22 VITALS — BP 128/82 | HR 70 | Temp 97.6°F | Ht 61.0 in | Wt 184.0 lb

## 2022-05-22 DIAGNOSIS — R059 Cough, unspecified: Secondary | ICD-10-CM

## 2022-05-22 DIAGNOSIS — R0602 Shortness of breath: Secondary | ICD-10-CM

## 2022-05-22 DIAGNOSIS — J9611 Chronic respiratory failure with hypoxia: Secondary | ICD-10-CM

## 2022-05-22 DIAGNOSIS — I5020 Unspecified systolic (congestive) heart failure: Secondary | ICD-10-CM | POA: Diagnosis not present

## 2022-05-22 LAB — CBC WITH DIFFERENTIAL/PLATELET
Basophils Absolute: 0.1 10*3/uL (ref 0.0–0.1)
Basophils Relative: 0.5 % (ref 0.0–3.0)
Eosinophils Absolute: 0.1 10*3/uL (ref 0.0–0.7)
Eosinophils Relative: 0.7 % (ref 0.0–5.0)
HCT: 41.4 % (ref 36.0–46.0)
Hemoglobin: 13.6 g/dL (ref 12.0–15.0)
Lymphocytes Relative: 16.2 % (ref 12.0–46.0)
Lymphs Abs: 1.6 10*3/uL (ref 0.7–4.0)
MCHC: 32.9 g/dL (ref 30.0–36.0)
MCV: 83.5 fl (ref 78.0–100.0)
Monocytes Absolute: 0.7 10*3/uL (ref 0.1–1.0)
Monocytes Relative: 6.7 % (ref 3.0–12.0)
Neutro Abs: 7.7 10*3/uL (ref 1.4–7.7)
Neutrophils Relative %: 75.9 % (ref 43.0–77.0)
Platelets: 234 10*3/uL (ref 150.0–400.0)
RBC: 4.95 Mil/uL (ref 3.87–5.11)
RDW: 17.3 % — ABNORMAL HIGH (ref 11.5–15.5)
WBC: 10.1 10*3/uL (ref 4.0–10.5)

## 2022-05-22 MED ORDER — AZELASTINE HCL 0.1 % NA SOLN: 2.0000 | Freq: Two times a day (BID) | NASAL | 0 refills | Status: DC

## 2022-05-22 MED ORDER — HYDROCOD POLI-CHLORPHE POLI ER 10-8 MG/5ML PO SUER: 5.0000 mL | Freq: Two times a day (BID) | ORAL | 0 refills | Status: DC

## 2022-05-22 NOTE — Progress Notes (Signed)
Please let patient know that her white count is trending down, which is good news. It is still mildly elevated, which is likely due to steroid use. Thanks.

## 2022-05-22 NOTE — Patient Instructions (Signed)
Please go downstairs for a chest X ray and labs.   You will hear from [place] with the results.   I refilled your bedtime cough syrup.

## 2022-05-22 NOTE — Progress Notes (Unsigned)
Subjective:     Patient ID: Kathleen [name], female    DOB: 08-26-1945, 76 y.o.   MRN: [id number]  Chief Complaint  Patient presents with   Wheezing    Recently in hospital for pneumonia and RSV, still has pneumonia in right lung and is starting to sound worse. Is also having some fluid retention.     HPI Patient is in today for worsening shortness of breath. Recent hospitalization for RSV and pneumonia. She had a hospital discharge follow up with her PCP, cardiologist and pulmonologist.  She has been taking oral steroids. Reports fluid retention. Gained 6 lbs and lost 4 lbs with diuretics.   Takes torsemide on Monday and Fridays but takes it on Wednesday prn.   Taking Tussionex at bedtime.  Taking Mucinex during the day.   Requests refill of tussionex.   Denies fever, chills, dizziness, chest pain, palpitations, abdominal pain, N/V/D, urinary symptoms, LE edema.      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    Concussion 10/04/2021   COPD (chronic obstructive pulmonary disease) (HCC)    Gout    Hypertension    Longstanding persistent atrial fibrillation (HCC)    on Xarelto   Nonischemic cardiomyopathy (HCC)    Obesity    Pacemaker    Spontaneous pneumothorax 2013    Past Surgical History:  Procedure Laterality Date   ABDOMINAL HYSTERECTOMY     APPENDECTOMY     ATRIAL FIBRILLATION ABLATION  07/20/2013   by Dr Christin Fudge   AV nodal ablation  11/01/2013   by Dr Christin Fudge, repeated by Dr Wilford Grist   BREAST BIOPSY Bilateral 1997   negative   CARDIAC CATHETERIZATION      CHOLECYSTECTOMY     HERNIA REPAIR     PACEMAKER INSERTION  06/2017   MDT Viva CRT-P implanted by Dr Christin Fudge after AV nodal ablation,  LV lead could not be placed   PACEMAKER INSERTION  05/2020   with lead bundle   RIGHT/LEFT HEART CATH AND CORONARY ANGIOGRAPHY N/A 03/20/2022   Procedure: RIGHT/LEFT HEART CATH AND CORONARY ANGIOGRAPHY;  Surgeon: [place]az[place], Peter M, MD;  [facility]: [facility] INVASIVE CV LAB;  Service: Cardiovascular;  Laterality: N/A;    Family History  Problem Relation Age of Onset   Breast cancer Mother 16       3 different times   Pancreatic cancer Mother    Emphysema Father    Healthy Brother    Breast cancer Cousin    Valvular heart disease Son    Obesity Daughter    Colon cancer Neg Hx    Esophageal cancer Neg Hx    Stomach cancer Neg Hx    Inflammatory bowel disease Neg Hx    Liver disease Neg Hx    Rectal cancer Neg Hx     Social History   Socioeconomic History   Marital status: Widowed    Spouse name: Not on file   Number of children: 1   Years of education: Not on file   Highest  education level: Not on file  Occupational History   Occupation: retired  Tobacco Use   Smoking status: Former    Packs/day: 1.00    Years: 20.00    Total pack years: 20.00    Types: Cigarettes    Quit date: 06/09/1987    Years since quitting: 34.9    Passive exposure: Past   Smokeless tobacco: Never   Tobacco comments:    Former smoker 12/25/21  Vaping Use   Vaping Use: Never used  Substance and Sexual Activity   Alcohol use: No    Alcohol/week: 0.0 standard drinks of alcohol   Drug use: No   Sexual activity: Not Currently  Other Topics Concern   Not on file  Social History Narrative   Pt lives in [place] alone.  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  Her son works in [employer].   Social Determinants of Health   Financial Resource Strain: Low Risk  (05/21/2022)   Overall Financial Resource Strain (CARDIA)    Difficulty of Paying Living  Expenses: Not very hard  Food Insecurity: No Food Insecurity (05/21/2022)   Hunger Vital Sign    Worried About Running Out of Food in the Last Year: Never true    Ran Out of Food in the Last Year: Never true  Transportation Needs: No Transportation Needs (05/21/2022)   PRAPARE - Administrator, Civil Service (Medical): No    Lack of Transportation (Non-Medical): No  Physical Activity: Insufficiently Active (05/21/2022)   Exercise Vital Sign    Days of Exercise per Week: 2 days    Minutes of Exercise per Session: 20 min  Stress: No Stress Concern Present (05/21/2022)   [employer] of Occupational Health - Occupational Stress Questionnaire    Feeling of Stress : Only a little  Social Connections: Moderately Isolated (05/21/2022)   Social Connection and Isolation Panel [NHANES]    Frequency of Communication with Friends and Family: More than three times a week    Frequency of Social Gatherings with Friends and Family: More than three times a week    Attends Religious Services: Never    Database administrator or Organizations: Yes    Attends Engineer, structural: More than 4 times per year    Marital Status: Widowed  Intimate Partner Violence: Not At Risk (05/21/2022)   Humiliation, Afraid, Rape, and Kick questionnaire    Fear of Current or Ex-Partner: No    Emotionally Abused: No    Physically Abused: No    Sexually Abused: No    Outpatient Medications Prior to Visit  Medication Sig Dispense Refill   acetaminophen (TYLENOL) 650 MG CR tablet Take 1,300 mg by mouth every 8 (eight) hours as needed for pain.     Bempedoic Acid (NEXLETOL) 180 MG TABS Take 1 tablet by mouth daily. 90 tablet 0   benzonatate (TESSALON) 200 MG capsule Take 1 capsule (200 mg total) by mouth 3 (three) times daily as needed for cough. 30 capsule 0   Cholecalciferol (VITAMIN D3) 50 MCG (2000 UT) TABS Take 2,000 Units by mouth at bedtime.     cyanocobalamin (VITAMIN B12) 1000 MCG/ML  injection Inject 1 mL (1,000 mcg total) into the muscle every 30 (thirty) days. 10 mL 0   docusate sodium (COLACE) 100 MG capsule Take 100 mg by mouth 2 (two) times daily.     dofetilide (TIKOSYN) 250 MCG capsule Take 1 capsule (250 mcg total) by mouth in the  morning and at bedtime. 60 capsule 6   febuxostat (ULORIC) 40 MG tablet TAKE 1 TABLET(40 MG) BY MOUTH EVERY NIGHT (Patient taking differently: Take 40 mg by mouth at bedtime.) 90 tablet 0   fluticasone (FLONASE) 50 MCG/ACT nasal spray Place 2 sprays into both nostrils daily. 18.2 mL 2   fluticasone furoate-vilanterol (BREO ELLIPTA) 200-25 MCG/ACT AEPB INHALE 1 PUFF INTO THE LUNGS DAILY 180 each 3   ipratropium-albuterol (DUONEB) 0.5-2.5 (3) MG/3ML SOLN Use 1 vial (3 mLs) by nebulization every 4 (four) hours as needed. 360 mL 1   linaclotide (LINZESS) 145 MCG CAPS capsule Take 1 capsule (145 mcg total) by mouth daily as needed (constipation). 90 capsule 0   losartan (COZAAR) 25 MG tablet TAKE 1 TABLET(25 MG) BY MOUTH AT BEDTIME (Patient taking differently: Take 25 mg by mouth at bedtime.) 90 tablet 3   Magnesium 500 MG TABS Take 500 mg by mouth at bedtime.     mupirocin ointment (BACTROBAN) 2 % Apply 1 application. topically 2 (two) times daily. (Patient taking differently: Apply 1 application  topically 2 (two) times daily as needed (wound care).) 30 g 2   nystatin cream (MYCOSTATIN) Apply to affected area 2 times daily (Patient taking differently: Apply 1 Application topically 2 (two) times daily as needed for dry skin.) 90 g 1   OXYGEN Inhale 4.5 L into the lungs continuous. Use with resmed ventilator     predniSONE (DELTASONE) 10 MG tablet Take 3 tabs for 5 days then 2 tabs daily for 5 days then 1 tab for 5 days then 1/2 tablet daily until you are seen back 50 tablet 0   revefenacin (YUPELRI) 175 MCG/3ML nebulizer solution Take 3 mLs (175 mcg total) by nebulization daily. 90 mL 11   rivaroxaban (XARELTO) 20 MG TABS tablet Take 1 tablet (20 mg  total) by mouth daily with supper. 30 tablet 5   spironolactone (ALDACTONE) 25 MG tablet TAKE 1 TABLET(25 MG) BY MOUTH EVERY MORNING (Patient taking differently: Take 25 mg by mouth daily.) 90 tablet 3   torsemide (DEMADEX) 20 MG tablet Take 20mg  (1 Tab) Monday and Friday (Patient taking differently: Take 20 mg by mouth 2 (two) times a week. Take 20mg  (1 Tab) Monday and Friday) 60 tablet 3   traMADol (ULTRAM) 50 MG tablet Take 1 tablet (50 mg total) by mouth every 6 (six) hours as needed. 360 tablet 1   azelastine (ASTELIN) 0.1 % nasal spray Place 2 sprays into both nostrils 2 (two) times daily. 30 mL 5   chlorpheniramine-HYDROcodone (TUSSIONEX) 10-8 MG/5ML Take 5 mLs by mouth every 12 (twelve) hours. 70 mL 0   albuterol (VENTOLIN HFA) 108 (90 Base) MCG/ACT inhaler Inhale 2 puffs into the lungs every 4 (four) hours as needed for wheezing or shortness of breath. (Patient not taking: Reported on 05/22/2022) 8 g 2   No facility-administered medications prior to visit.    Allergies  Allergen Reactions   Allopurinol Other (See Comments)    Reaction:  Dizziness    Clindamycin Anaphylaxis and Hives   Flublok [Influenza Vaccine Recombinant] Other (See Comments)    Fever 103 with no alternative explanation day after vaccine.  Wednesday Highfill FNP-C   Pneumococcal 13-Val Conj Vacc Itching, Swelling and Rash   Dronedarone Rash   Brovana [Arformoterol]     Caused muscle pain   Budesonide     Caused extreme joint pain   Entresto [Sacubitril-Valsartan] Other (See Comments)    hypotension   Fosamax [Alendronate Sodium] Nausea  Only   Jardiance [Empagliflozin]     Caused a vaginal infection   Meperidine Nausea And Vomiting   Microplegia Msa-Msg [Plegisol]     Loopy,diarrhea   Rosuvastatin Other (See Comments)    Reaction:  Muscle spasms    Tetracycline Hives   Adhesive [Tape] Rash   Lovastatin Rash and Other (See Comments)    Muscle Pain    ROS     Objective:    Physical  Exam Constitutional:      General: She is not in acute distress.    Appearance: She is not ill-appearing.  HENT:     Mouth/Throat:     Mouth: Mucous membranes are moist.  Eyes:     Extraocular Movements: Extraocular movements intact.     Conjunctiva/sclera: Conjunctivae normal.  Cardiovascular:     Rate and Rhythm: Normal rate and regular rhythm.  Pulmonary:     Effort: Pulmonary effort is normal.     Breath sounds: Wheezing and rhonchi present.     Comments: On oxygen via [place] Musculoskeletal:     Right lower leg: No edema.     Left lower leg: No edema.  Skin:    General: Skin is warm and dry.  Neurological:     General: No focal deficit present.     Mental Status: She is alert and oriented to person, place, and time.  Psychiatric:        Mood and Affect: Mood normal.        Behavior: Behavior normal.        Thought Content: Thought content normal.     BP 128/82 (BP Location: Left Arm, Patient Position: Sitting, Cuff Size: Large)   Pulse 70   Temp 97.6 F (36.4 C) (Temporal)   Ht  (1.549 m)   Wt 184 lb (83.5 kg)   SpO2 93%   BMI 34.77 kg/m  Wt Readings from Last 3 Encounters:  05/22/22 184 lb (83.5 kg)  05/19/22 185 lb 12.8 oz (84.3 kg)  05/18/22 184 lb (83.5 kg)       Assessment & Plan:   Problem List Items Addressed This Visit       Cardiovascular and Mediastinum   Heart failure with reduced ejection fraction (HCC)     Respiratory   Chronic respiratory failure with hypoxia (HCC)   Other Visit Diagnoses     Shortness of breath    -  Primary   Relevant Orders   DG Chest 2 View (Completed)   CBC with Differential/Platelet (Completed)   Cough, unspecified type       Relevant Medications   chlorpheniramine-HYDROcodone (TUSSIONEX) 10-8 MG/5ML      Reviewed notes from PCP, hospital discharge summary, cardiologist and pulmonologist. She is not in any acute distress.  Currently euvolemic. Stat CBC and chest x-ray ordered.  Refilled  Tussionex. Continue current medications. Follow-up pending results  I have discontinued Marily Konczal. Mcgroarty's azelastine. I am also having her maintain her Magnesium, Vitamin D3, docusate sodium, OXYGEN, acetaminophen, torsemide, nystatin cream, mupirocin ointment, losartan, spironolactone, rivaroxaban, febuxostat, albuterol, dofetilide, fluticasone furoate-vilanterol, cyanocobalamin, linaclotide, Nexletol, Yupelri, traMADol, ipratropium-albuterol, fluticasone, predniSONE, benzonatate, and chlorpheniramine-HYDROcodone.  Meds ordered this encounter  Medications   DISCONTD: azelastine (ASTELIN) 0.1 % nasal spray    Sig: Place 2 sprays into both nostrils 2 (two) times daily.    Dispense:  30 mL    Refill:  0   chlorpheniramine-HYDROcodone (TUSSIONEX) 10-8 MG/5ML    Sig: Take 5 mLs by mouth every 12 (twelve)  hours.    Dispense:  70 mL    Refill:  0    Order Specific Question:   Supervising Provider    Answer:   Hillard Danker A [[id number]]

## 2022-05-25 ENCOUNTER — Ambulatory Visit (INDEPENDENT_AMBULATORY_CARE_PROVIDER_SITE_OTHER): Payer: Medicare Other

## 2022-05-25 DIAGNOSIS — Z950 Presence of cardiac pacemaker: Secondary | ICD-10-CM | POA: Diagnosis not present

## 2022-05-25 DIAGNOSIS — I5022 Chronic systolic (congestive) heart failure: Secondary | ICD-10-CM

## 2022-05-26 NOTE — Progress Notes (Signed)
EPIC Encounter for ICM Monitoring  Patient Name: MARIONNA GONIA is a 76 y.o. female Date: 05/26/2022 Primary Care Physican: [name], MD Primary Cardiologist: Bensimhon Electrophysiologist: Camnitz Bi-V Pacing: 100%  05/19/2023 Weight: 184 lbs        1st ICM Remote Transmission.  Heart Failure questions reviewed.  Pt reports she is still congested and coughing post pneumonia.    Pt lives alone in independent senior housing.  Hospitalized with pneumonia and RSV at end of November which correlates with impedance possible dryness.    Optivol thoracic impedance suggesting normal fluid levels but was suggesting possible fluid accumulation from 10/6-11/3.  Possible dryness from 11/25-11/11.   Prescribed:  Torsemide 20 mg take 1 tablet(s) (20 mg total) by mouth every Monday and Friday.   Spironolactone 25 mg take 1 tablet daily  Recommendations:   Encouraged to call if experiencing fluid symptoms.  Follow-up plan: ICM clinic phone appointment on 06/29/2022.   91 day device clinic remote transmission 07/01/2022.    EP/Cardiology Office Visits: 06/06/2023 with Dr. Suella Grove.    Copy of ICM check sent to Dr. Elberta Fortis.   3 month ICM trend: 05/25/2022.    12-14 Month ICM trend:     Karie Soda, RN 05/26/2022 3:14 PM

## 2022-05-27 ENCOUNTER — Other Ambulatory Visit: Payer: Medicare Other | Admitting: Family Medicine

## 2022-05-27 ENCOUNTER — Encounter: Payer: Self-pay | Admitting: Family Medicine

## 2022-05-27 VITALS — HR 79 | Temp 97.4°F | Resp 20

## 2022-05-27 DIAGNOSIS — J9611 Chronic respiratory failure with hypoxia: Secondary | ICD-10-CM

## 2022-05-27 DIAGNOSIS — R058 Other specified cough: Secondary | ICD-10-CM

## 2022-05-27 DIAGNOSIS — I5020 Unspecified systolic (congestive) heart failure: Secondary | ICD-10-CM

## 2022-05-27 NOTE — Progress Notes (Signed)
[facility] Palliative Care Consult Note Telephone: [phone]  Fax: [phone]    Date of encounter: 05/27/22 1:23 PM PATIENT NAME: Kathleen [name] [address] [place] [address]   [id number] (home)  DOB: 1946/05/11 MRN: [id number] PRIMARY CARE PROVIDER:    [name], MD,  [place] [address] [phone]  REFERRING PROVIDER:   [name], MD [address] [place],  [place] [address] [phone]  RESPONSIBLE PARTY:    Contact Information     Name Relation Home Work [place] Son [phone]  [phone]   Cherrish, Vitali [phone]  [phone]   Alveria Apley Daughter   [phone]        I met face to face with patient in [place] facility. Palliative Care was asked to follow this patient by consultation request of  [name], MD to address advance care planning and complex medical decision making. This is a follow up visit   ASSESSMENT , SYMPTOM MANAGEMENT AND PLAN / RECOMMENDATIONS:   Post viral cough syndrome/Chronic respiratory failure Sequelae of post viral cough from RSV, no evidence of acute infection. Agree with steroid admin to help reduce symptom burden.    Heart failure with reduced EF Appears euvolemic presently. Advised on prolonged QT and risks with additive prolongation especially with Tikosyn for rhythm control.   Advance Care Planning/Goals of Care: Goals include to maximize quality of life and symptom management.  CODE STATUS: Full code.  Pt has not been ready to discuss code status or transition to more comfort based care.     Follow up Palliative Care Visit: Palliative care will continue to follow for complex medical decision making, advance care planning, and clarification of goals. Return 4 weeks or prn.   This visit was coded based on medical decision making (MDM).  PPS: 40%  HOSPICE ELIGIBILITY/DIAGNOSIS:  TBD  Chief Complaint:  Palliative Care is continuing to follow patient for chronic medical management in setting of COPD, chronic respiratory failure.   HISTORY OF PRESENT ILLNESS:  Kathleen [name] is a 76 y.o. 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  She was recently diagnosed with RSV.  She states she is still on Prednisone and dose was upped to 3 tabs daily.  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  She still has pneumonia on right lung.  NP repeated CXR on 05/22/22 which showed resolution of pneumonia. Still continues to have hoarse, barking cough, non-productive. Overall reports feeling better except for cough which has made her ribs hurt. Pt states she used to be on Azithromycin in prophylactic dose but was told that she couldn't do this with Tikosyn.  Advised pt it was due to QT prolongation and risk of life threatening arrhythmia if QT too prolonged.    History obtained from review of EMR, discussion with Kathleen [name].     Latest Ref Rng & Units 05/22/2022    9:01 AM 05/19/2022   12:11 PM 05/01/2022    2:29 AM  CBC  WBC 4.0 - 10.5 K/uL 10.1  11.1  13.5   Hemoglobin 12.0 - 15.0 g/dL 13.6  13.3  11.7   Hematocrit 36.0 - 46.0 % 41.4  40.0  37.0   Platelets 150.0 - 400.0 K/uL 234.0  209.0  241        Latest Ref Rng & Units 05/19/2022   12:11 PM 05/03/2022    3:03 AM 05/01/2022    2:29 AM  CMP  Glucose 70 - 99 mg/dL 161  118  136   BUN 6 - 23 mg/dL _0 Creatinine 0.40 - 1.20 mg/dL 0.72  0.66  1.00   Sodium 135 - 145 mEq/L 140  137  139    Potassium 3.5 - 5.1 mEq/L 4.5  4.4  4.8   Chloride 96 - 112 mEq/L 103  100  101   CO2 19 - 32 mEq/L _1 Calcium 8.4 - 10.5 mg/dL 9.8  10.4  9.9        Latest Ref Rng & Units 04/28/2022   12:00 PM 03/22/2022   12:39 AM 03/21/2022   12:22 AM  Hepatic Function  Total Protein 6.5 - 8.1 g/dL 6.6     Albumin 3.5 - 5.0 g/dL 4.1  3.1  3.0   AST 15 - 41 U/L 14     ALT 0 - 44 U/L 14     Alk Phosphatase 38 - 126 U/L 26     Total Bilirubin 0.3 - 1.2 mg/dL 0.6       05/22/22 CXR 2 view: FINDINGS: Unchanged left chest wall pacemaker and mild cardiomegaly with prominent pericardial fat pads. Normal pulmonary vascularity. Unchanged mild atelectasis at the medial right lung base. Similar emphysematous changes in the upper lobes. No focal consolidation, pleural effusion, or pneumothorax. No acute osseous abnormality.   IMPRESSION: 1. No acute cardiopulmonary disease.  I reviewed EMR for available labs, medications, imaging, studies and related documents.  Records reviewed and summarized above.   ROS General: NAD Cardiovascular: endorses right sided chest pain, endorses DOE Pulmonary: endorses non productive cough, denies increased SOB Abdomen: endorses good appetite, denies constipation, endorses continence of bowel GU: denies dysuria, endorses continence of urine MSK:  denies increased weakness, no falls reported Skin: denies rashes or wounds Neurological: denies pain, has some insomnia Psych:  mood is slightly improved Heme/lymph/immuno: denies bruises, abnormal bleeding  Physical Exam: Current and past weights:  183 lbs Constitutional: NAD General: WN,WD ENMT: intact hearing, oral mucous membranes moist CV: S1S2, IRIR, no LE edema Pulmonary: Faint crackle in RLL/fine crackles in LLL, , no increased work of breathing, hoarse non-productive cough on 4-5L [place] Abdomen: normo-active BS + 4 quadrants, soft and non tender GU: deferred MSK: no sarcopenia, moves all extremities,  ambulatory Skin: warm and dry, no rashes or wounds on visible skin Neuro:  no generalized weakness,  no cognitive impairment Psych: non-anxious affect, A and O x 3 Hem/lymph/immuno: no widespread bruising   Thank you for the opportunity to participate in the care of Kathleen [name].  The palliative care team will continue to follow. Please call our office at [phone] if we can be of additional assistance.   Marijo Conception, FNP -C  COVID-19 PATIENT SCREENING TOOL Asked and negative response unless otherwise noted:   Have you had symptoms of covid, tested positive or been in contact with someone with symptoms/positive test in the past 5-10 days?  NO

## 2022-06-02 ENCOUNTER — Encounter: Payer: Self-pay | Admitting: Family Medicine

## 2022-06-02 DIAGNOSIS — R058 Other specified cough: Secondary | ICD-10-CM | POA: Insufficient documentation

## 2022-06-04 DIAGNOSIS — E785 Hyperlipidemia, unspecified: Secondary | ICD-10-CM

## 2022-06-04 DIAGNOSIS — I272 Pulmonary hypertension, unspecified: Secondary | ICD-10-CM

## 2022-06-04 DIAGNOSIS — Z9981 Dependence on supplemental oxygen: Secondary | ICD-10-CM

## 2022-06-04 DIAGNOSIS — Z7901 Long term (current) use of anticoagulants: Secondary | ICD-10-CM

## 2022-06-04 DIAGNOSIS — Z950 Presence of cardiac pacemaker: Secondary | ICD-10-CM

## 2022-06-04 DIAGNOSIS — J441 Chronic obstructive pulmonary disease with (acute) exacerbation: Secondary | ICD-10-CM | POA: Diagnosis not present

## 2022-06-04 DIAGNOSIS — I088 Other rheumatic multiple valve diseases: Secondary | ICD-10-CM

## 2022-06-04 DIAGNOSIS — N1831 Chronic kidney disease, stage 3a: Secondary | ICD-10-CM

## 2022-06-04 DIAGNOSIS — I428 Other cardiomyopathies: Secondary | ICD-10-CM

## 2022-06-04 DIAGNOSIS — I5042 Chronic combined systolic (congestive) and diastolic (congestive) heart failure: Secondary | ICD-10-CM

## 2022-06-04 DIAGNOSIS — I480 Paroxysmal atrial fibrillation: Secondary | ICD-10-CM

## 2022-06-04 DIAGNOSIS — I130 Hypertensive heart and chronic kidney disease with heart failure and stage 1 through stage 4 chronic kidney disease, or unspecified chronic kidney disease: Secondary | ICD-10-CM | POA: Diagnosis not present

## 2022-06-04 DIAGNOSIS — G319 Degenerative disease of nervous system, unspecified: Secondary | ICD-10-CM

## 2022-06-04 DIAGNOSIS — I252 Old myocardial infarction: Secondary | ICD-10-CM

## 2022-06-04 DIAGNOSIS — Z6834 Body mass index (BMI) 34.0-34.9, adult: Secondary | ICD-10-CM

## 2022-06-04 DIAGNOSIS — J432 Centrilobular emphysema: Secondary | ICD-10-CM | POA: Diagnosis not present

## 2022-06-04 DIAGNOSIS — I442 Atrioventricular block, complete: Secondary | ICD-10-CM

## 2022-06-04 DIAGNOSIS — I4892 Unspecified atrial flutter: Secondary | ICD-10-CM

## 2022-06-04 DIAGNOSIS — I2510 Atherosclerotic heart disease of native coronary artery without angina pectoris: Secondary | ICD-10-CM

## 2022-06-04 DIAGNOSIS — G4733 Obstructive sleep apnea (adult) (pediatric): Secondary | ICD-10-CM

## 2022-06-04 DIAGNOSIS — E1140 Type 2 diabetes mellitus with diabetic neuropathy, unspecified: Secondary | ICD-10-CM

## 2022-06-04 DIAGNOSIS — Z9181 History of falling: Secondary | ICD-10-CM

## 2022-06-04 DIAGNOSIS — Z8701 Personal history of pneumonia (recurrent): Secondary | ICD-10-CM

## 2022-06-04 DIAGNOSIS — J9611 Chronic respiratory failure with hypoxia: Secondary | ICD-10-CM | POA: Diagnosis not present

## 2022-06-04 DIAGNOSIS — E1122 Type 2 diabetes mellitus with diabetic chronic kidney disease: Secondary | ICD-10-CM

## 2022-06-05 ENCOUNTER — Encounter (HOSPITAL_COMMUNITY): Payer: Self-pay | Admitting: Internal Medicine

## 2022-06-05 ENCOUNTER — Ambulatory Visit (HOSPITAL_COMMUNITY)
Admit: 2022-06-05 | Discharge: 2022-06-05 | Disposition: A | Discharge status: Home / Self Care | Payer: Medicare Other | Attending: Internal Medicine | Admitting: Internal Medicine

## 2022-06-05 VITALS — BP 100/60 | HR 83 | Wt 189.4 lb

## 2022-06-05 DIAGNOSIS — I5042 Chronic combined systolic (congestive) and diastolic (congestive) heart failure: Secondary | ICD-10-CM | POA: Diagnosis not present

## 2022-06-05 DIAGNOSIS — Z6835 Body mass index (BMI) 35.0-35.9, adult: Secondary | ICD-10-CM | POA: Insufficient documentation

## 2022-06-05 DIAGNOSIS — G4733 Obstructive sleep apnea (adult) (pediatric): Secondary | ICD-10-CM | POA: Insufficient documentation

## 2022-06-05 DIAGNOSIS — Z9981 Dependence on supplemental oxygen: Secondary | ICD-10-CM | POA: Diagnosis not present

## 2022-06-05 DIAGNOSIS — I4811 Longstanding persistent atrial fibrillation: Secondary | ICD-10-CM | POA: Diagnosis not present

## 2022-06-05 DIAGNOSIS — Z87440 Personal history of urinary (tract) infections: Secondary | ICD-10-CM | POA: Insufficient documentation

## 2022-06-05 DIAGNOSIS — Z7901 Long term (current) use of anticoagulants: Secondary | ICD-10-CM | POA: Insufficient documentation

## 2022-06-05 DIAGNOSIS — J9611 Chronic respiratory failure with hypoxia: Secondary | ICD-10-CM | POA: Diagnosis not present

## 2022-06-05 DIAGNOSIS — I5022 Chronic systolic (congestive) heart failure: Secondary | ICD-10-CM | POA: Diagnosis not present

## 2022-06-05 DIAGNOSIS — Z7952 Long term (current) use of systemic steroids: Secondary | ICD-10-CM | POA: Diagnosis not present

## 2022-06-05 DIAGNOSIS — Z79899 Other long term (current) drug therapy: Secondary | ICD-10-CM | POA: Insufficient documentation

## 2022-06-05 DIAGNOSIS — I130 Hypertensive heart and chronic kidney disease with heart failure and stage 1 through stage 4 chronic kidney disease, or unspecified chronic kidney disease: Secondary | ICD-10-CM | POA: Insufficient documentation

## 2022-06-05 DIAGNOSIS — N189 Chronic kidney disease, unspecified: Secondary | ICD-10-CM | POA: Diagnosis not present

## 2022-06-05 DIAGNOSIS — J8489 Other specified interstitial pulmonary diseases: Secondary | ICD-10-CM | POA: Insufficient documentation

## 2022-06-05 DIAGNOSIS — Z7951 Long term (current) use of inhaled steroids: Secondary | ICD-10-CM | POA: Insufficient documentation

## 2022-06-05 DIAGNOSIS — Z950 Presence of cardiac pacemaker: Secondary | ICD-10-CM | POA: Insufficient documentation

## 2022-06-05 DIAGNOSIS — Z87891 Personal history of nicotine dependence: Secondary | ICD-10-CM | POA: Insufficient documentation

## 2022-06-05 DIAGNOSIS — I2510 Atherosclerotic heart disease of native coronary artery without angina pectoris: Secondary | ICD-10-CM | POA: Diagnosis not present

## 2022-06-05 DIAGNOSIS — I5032 Chronic diastolic (congestive) heart failure: Secondary | ICD-10-CM | POA: Diagnosis present

## 2022-06-05 DIAGNOSIS — I480 Paroxysmal atrial fibrillation: Secondary | ICD-10-CM

## 2022-06-05 NOTE — Progress Notes (Addendum)
ADVANCED HF CLINIC NOTE   Primary Care: [name], MD Primary Cardiologist: Dr. Weyman Croon ([place]) HF Cardiologist: Dr. Haroldine Laws  HPI: [name] is a 76 y.o. 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 Recently moved from [place] to [place] and referred by Dr. Weyman Croon to establish cardiology care.   Previous echo with EF 40% but EF recovered. Echo ([facility]) 8/22 EF > 55% RV normal   She has been followed by Dr. Dwana Curd and Weyman Croon at [facility]. Has maintained NSR on Tikosyn.   RHC + LHC (01/17/20): RA 12, PA mean 29, PCWP 13, CI 2.2, Coronary angiogram without any stenosis  RHC (02/08/20): RA 8, PAM 24, wedge 16, CI 2.5  Now followed by Dr. Erin Fulling in Pulmonary for her BOOP.    Saw [place] in HF Clinic for the fist time in 9/22.  Zio 11/22: Predominantly AF with v-pacing with periods of sinus rhythm with V-pacing - avg HR of 75 bpm. 2. 2. Rare PACs and PVCs.   Follow up 1/23 stable NYHA III, volume OK on torsemide 2x/week and PRN.  Seen in AF clinic 3/23, continued on dofetilide 250 bid.  Admitted in 4/23 with mechanical fall Admitted in 10/23 with fever/AMS after flu shot Admitted in 11/23 with RSV  Today she returns for HF follow up with her daughter. Wears 4-5L O2 chronically. Doesn't feel great. Struggles with ADLs. Taking lasix MWF. Very fatigued. Wearing bipap most nights. Some swelling in belly but no LE edema. Echo 10/23 and 11/23 EF 35-40% (I felt 40-45%). Having episodes of CP that can wake her up from sleep. No exertional CP.   Heart cath 10/23 LAD 25% Cx 10%  PA 41/15 (28) PCWP 13 Fick 5.3/2.8   Pacer interrogation today No AF/VT Fluid low. Activity level 1-2hr/day. 100% bivP  Past Medical History:  Diagnosis Date   Asthma    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     albuterol (VENTOLIN HFA) 108 (90 Base) MCG/ACT inhaler Inhale 2 puffs into the lungs every 4 (four) hours as needed for wheezing or shortness of breath. 8 g 2   azelastine (ASTELIN) 0.1 % nasal spray USE 2 SPRAYS IN EACH NOSTRIL TWICE DAILY 90 mL 0   Bempedoic Acid (NEXLETOL) 180 MG TABS Take 1 tablet by mouth daily. 90 tablet 0   chlorpheniramine-HYDROcodone (TUSSIONEX) 10-8 MG/5ML Take 5 mLs by mouth every 12 (twelve) hours. 70 mL 0   Cholecalciferol (VITAMIN D3) 50 MCG (2000 UT) TABS Take 2,000 Units by mouth at bedtime.     cyanocobalamin (VITAMIN B12) 1000 MCG/ML injection Inject 1 mL (1,000 mcg total) into the muscle every 30 (thirty) days. 10 mL 0   docusate sodium (COLACE) 100 MG capsule Take 100 mg  by mouth 2 (two) times daily.     dofetilide (TIKOSYN) 250 MCG capsule Take 1 capsule (250 mcg total) by mouth in the morning and at bedtime. 60 capsule 6   febuxostat (ULORIC) 40 MG tablet TAKE 1 TABLET(40 MG) BY MOUTH EVERY NIGHT 90 tablet 0   fluticasone (FLONASE) 50 MCG/ACT nasal spray Place 2 sprays into both nostrils daily. 18.2 mL 2   fluticasone furoate-vilanterol (BREO ELLIPTA) 200-25 MCG/ACT AEPB INHALE 1 PUFF INTO THE LUNGS DAILY 180 each 3   ipratropium-albuterol (DUONEB) 0.5-2.5 (3) MG/3ML SOLN Use 1 vial (3 mLs) by nebulization every 4 (four) hours as needed. 360 mL 1   linaclotide (LINZESS) 145 MCG CAPS capsule Take 1 capsule (145 mcg total) by mouth daily as  needed (constipation). 90 capsule 0   losartan (COZAAR) 25 MG tablet TAKE 1 TABLET(25 MG) BY MOUTH AT BEDTIME 90 tablet 3   Magnesium 500 MG TABS Take 500 mg by mouth at bedtime.     nystatin cream (MYCOSTATIN) Apply to affected area 2 times daily 90 g 1   OXYGEN Inhale 4.5 L into the lungs continuous. Use with resmed ventilator     predniSONE (DELTASONE) 10 MG tablet Take 10 mg by mouth daily with breakfast.     revefenacin (YUPELRI) 175 MCG/3ML nebulizer solution Take 3 mLs (175 mcg total) by nebulization daily. 90 mL 11   rivaroxaban (XARELTO) 20 MG TABS tablet Take 1 tablet (20 mg total) by mouth daily with supper. 30 tablet 5   spironolactone (ALDACTONE) 25 MG tablet TAKE 1 TABLET(25 MG) BY MOUTH EVERY MORNING 90 tablet 3   torsemide (DEMADEX) 20 MG tablet Take 20mg  (1 Tab) Monday and Friday 60 tablet 3   traMADol (ULTRAM) 50 MG tablet Take 1 tablet (50 mg total) by mouth every 6 (six) hours as needed. 360 tablet 1   No current facility-administered medications for this encounter.   Allergies  Allergen Reactions   Allopurinol Other (See Comments)    Reaction:  Dizziness    Clindamycin Anaphylaxis and Hives   Flublok [Influenza Vaccine Recombinant] Other (See Comments)    Fever 103 with no alternative explanation day after vaccine.  Santiago Glad Highfill FNP-C   Pneumococcal 13-Val Conj Vacc Itching, Swelling and Rash   Dronedarone Rash   Brovana [Arformoterol]     Caused muscle pain   Budesonide     Caused extreme joint pain   Entresto [Sacubitril-Valsartan] Other (See Comments)    hypotension   Fosamax [Alendronate Sodium] Nausea Only   Jardiance [Empagliflozin]     Caused a vaginal infection   Meperidine Nausea And Vomiting   Microplegia Msa-Msg [Plegisol]     Loopy,diarrhea   Rosuvastatin Other (See Comments)    Reaction:  Muscle spasms    Tetracycline Hives   Adhesive [Tape] Rash   Lovastatin Rash and Other (See Comments)    Muscle Pain   Social History   Socioeconomic  History   Marital status: Widowed    Spouse name: Not on file   Number of children: 1   Years of education: Not on file   Highest education level: Not on file  Occupational History   Occupation: retired  Tobacco Use   Smoking status: Former    Packs/day: 1.00    Years: 20.00    Total pack years: 20.00    Types: Cigarettes    Quit date: 06/09/1987    Years since quitting: 35.0    Passive exposure: Past   Smokeless tobacco: Never  Tobacco comments:    Former smoker 12/25/21  Vaping Use   Vaping Use: Never used  Substance and Sexual Activity   Alcohol use: No    Alcohol/week: 0.0 standard drinks of alcohol   Drug use: No   Sexual activity: Not Currently  Other Topics Concern   Not on file  Social History Narrative   Pt lives in [place] alone.  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  Her son works in [employer].   Social Determinants of Health   Financial Resource Strain: Low Risk  (05/21/2022)   Overall Financial Resource Strain (CARDIA)    Difficulty of Paying Living Expenses: Not very hard  Food Insecurity: No Food Insecurity (05/21/2022)   Hunger Vital Sign    Worried About Running Out of Food in the Last Year: Never true    Ran Out of Food in the Last Year: Never true  Transportation Needs: No Transportation Needs (05/21/2022)   PRAPARE - Hydrologist (Medical): No    Lack of Transportation (Non-Medical): No  Physical Activity: Insufficiently Active (05/21/2022)   Exercise Vital Sign    Days of Exercise per Week: 2 days    Minutes of Exercise per Session: 20 min  Stress: No Stress Concern Present (05/21/2022)   [place]    Feeling of Stress : Only a little  Social Connections: Moderately Isolated (05/21/2022)   Social Connection and Isolation Panel [NHANES]    Frequency of Communication with Friends and Family: More than three times a week    Frequency  of Social Gatherings with Friends and Family: More than three times a week    Attends Religious Services: Never    Marine scientist or Organizations: Yes    Attends Music therapist: More than 4 times per year    Marital Status: Widowed  Intimate Partner Violence: Not At Risk (05/21/2022)   Humiliation, Afraid, Rape, and Kick questionnaire    Fear of Current or Ex-Partner: No    Emotionally Abused: No    Physically Abused: No    Sexually Abused: No   Family History  Problem Relation Age of Onset   Breast cancer Mother 53       3 different times   Pancreatic cancer Mother    Emphysema Father    Healthy Brother    Breast cancer Cousin    Valvular heart disease Son    Obesity Daughter    Colon cancer Neg Hx    Esophageal cancer Neg Hx    Stomach cancer Neg Hx    Inflammatory bowel disease Neg Hx    Liver disease Neg Hx    Rectal cancer Neg Hx    BP 100/60   Pulse 83   Wt 85.9 kg (189 lb 6.4 oz)   SpO2 97% Comment: 5 l n/c  BMI 35.79 kg/m   Wt Readings from Last 3 Encounters:  06/05/22 85.9 kg (189 lb 6.4 oz)  05/22/22 83.5 kg (184 lb)  05/19/22 84.3 kg (185 lb 12.8 oz)   PHYSICAL EXAM: General:  Obese woman wearing O2 by [place]. No resp difficulty  HEENT: normal Neck: supple. no JVD. Carotids 2+ bilat; no bruits. No lymphadenopathy or thryomegaly appreciated. Cor: PMI nondisplaced. Regular rate & rhythm. No rubs, gallops or murmurs. Lungs: clear Abdomen:obese  soft, nontender, nondistended. No hepatosplenomegaly. No bruits or masses. Good bowel sounds. Extremities: no cyanosis, clubbing, rash, edema  Neuro: alert & orientedx3, cranial nerves grossly intact. moves all 4 extremities w/o difficulty. Affect pleasant   ASSESSMENT & PLAN:   1. Chronic systolic/diastolic HF - Echo 8/22 EF 55%. RV ok  - Echo 10/23 and 11.23 EF 35-40% (I thought 40-45%) - R/L cath 10/23 minimal CAD.  - Chronic NYHA III-IIIB - mostly due to lung disease and obesity. Volume  status ok on exam and Optivol - Continue torsemide 20 mg 3x/week + PRN (MWF). - Continue losartan 25 mg daily (failed Entresto due to low BP) - Continue spiro 25 daily - No b-blocker with low BP and severe lung disease - No Jardiance with frequent UTIs. - Encouraged compliance with Cardiomems. - Recent labs reviewed and are ok  - See back in 3-4 months to repeat echo   2. 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 Francine Graven) - Followed by Dr. Vassie Loll - Sats ok today  3. Persistent AF - s/p AVN ablation and PM (failed upgrade to BiV) - maintaining NSR on Tikosyn (Zio showed significant AF burden) but in NSR by interrogation today Minimal AF on device interrogation - Followed by EP/AF Clinic - Continue Xarelto.  4. Morbid obesity  - Body mass index is 35.79 kg/m. - Continue weight loss efforts  5. OSA - On Bipap.  Arvilla Meres, MD  11:47 AM

## 2022-06-05 NOTE — Patient Instructions (Signed)
No changes to medications.  Your physician has requested that you have an echocardiogram. Echocardiography is a painless test that uses sound waves to create images of your heart. It provides your doctor with information about the size and shape of your heart and how well your heart's chambers and valves are working. This procedure takes approximately one hour. There are no restrictions for this procedure. Please do NOT wear cologne, perfume, aftershave, or lotions (deodorant is allowed). Please arrive 15 minutes prior to your appointment time.  Your physician recommends that you schedule a follow-up appointment in: 4 months with echocardiogram  If you have any questions or concerns before your next appointment please send [place] a message through [place] or call our office at [phone].    TO LEAVE A MESSAGE FOR THE NURSE SELECT OPTION 2, PLEASE LEAVE A MESSAGE INCLUDING: YOUR NAME DATE OF BIRTH CALL BACK NUMBER REASON FOR CALL**this is important as we prioritize the call backs  YOU WILL RECEIVE A CALL BACK THE SAME DAY AS LONG AS YOU CALL BEFORE 4:00 PM  At the [facility], you and your health needs are our priority. As part of our continuing mission to provide you with exceptional heart care, we have created designated Provider Care Teams. These Care Teams include your primary Cardiologist (physician) and Advanced Practice Providers (APPs- Physician Assistants and Nurse Practitioners) who all work together to provide you with the care you need, when you need it.   You may see any of the following providers on your designated Care Team at your next follow up: Dr Arvilla Meres Dr Marca Ancona Dr. Marcos Eke, NP Robbie Lis, [place] [facility] [place], [place] Brynda Peon, NP Karle Plumber, PharmD   Please be sure to bring in all your medications bottles to every appointment.

## 2022-06-10 ENCOUNTER — Other Ambulatory Visit: Payer: Self-pay | Admitting: Internal Medicine

## 2022-06-11 DIAGNOSIS — Z9981 Dependence on supplemental oxygen: Secondary | ICD-10-CM | POA: Insufficient documentation

## 2022-06-11 DIAGNOSIS — Z7901 Long term (current) use of anticoagulants: Secondary | ICD-10-CM | POA: Insufficient documentation

## 2022-06-11 DIAGNOSIS — R040 Epistaxis: Secondary | ICD-10-CM | POA: Insufficient documentation

## 2022-06-12 ENCOUNTER — Other Ambulatory Visit (HOSPITAL_COMMUNITY): Payer: Self-pay

## 2022-06-18 ENCOUNTER — Encounter (HOSPITAL_BASED_OUTPATIENT_CLINIC_OR_DEPARTMENT_OTHER): Payer: Self-pay | Admitting: Pulmonary Disease

## 2022-06-18 ENCOUNTER — Ambulatory Visit (INDEPENDENT_AMBULATORY_CARE_PROVIDER_SITE_OTHER): Payer: Medicare Other | Admitting: Pulmonary Disease

## 2022-06-18 VITALS — BP 108/68 | HR 80 | Temp 98.8°F | Ht 60.5 in | Wt 186.6 lb

## 2022-06-18 DIAGNOSIS — J441 Chronic obstructive pulmonary disease with (acute) exacerbation: Secondary | ICD-10-CM

## 2022-06-18 DIAGNOSIS — J9611 Chronic respiratory failure with hypoxia: Secondary | ICD-10-CM | POA: Diagnosis not present

## 2022-06-18 DIAGNOSIS — G4730 Sleep apnea, unspecified: Secondary | ICD-10-CM

## 2022-06-18 MED ORDER — NYSTATIN 100000 UNIT/ML MT SUSP: 5.0000 mL | Freq: Four times a day (QID) | OROMUCOSAL | 0 refills | Status: DC

## 2022-06-18 NOTE — Assessment & Plan Note (Signed)
Main issue is tracheobronchomalacia and some degree of asthma but functionally being treated for COPD. Exacerbation was due to RSV infection is slowly resolving. Will continue to taper prednisone to off and I have asked her to decrease prednisone to 5 mg on alternate days and discontinue after this month. . She will continue Yupelri and have asked her to change to albuterol nebs instead of DuoNebs to avoid anticholinergic toxicity. I have asked her to decrease Breo to 100 mcg daily to avoid thrush

## 2022-06-18 NOTE — Assessment & Plan Note (Signed)
I doubt that NIV is helping her in any way.  In fact she appears to be bothered by the alarms and this is disrupting her sleep. I reviewed compliance and she is barely using this, average usage is 3 hours per night with several missed nights she has only used it 16 out of 30 days. ABG does not show hypercarbia.  Main reason is tracheobronchomalacia and she would do well with splinting with BiPAP, previous titration study has shown requirement of 12/8 cm and we will reinstitute this and DC NIV She will continue on oxygen blended into the BiPAP

## 2022-06-18 NOTE — Progress Notes (Signed)
Subjective:    Patient ID: Kathleen [name], female    DOB: 1945/06/09, 77 y.o.   MRN: [id number]  HPI  77 yo woman, ex-smoker with asthma/COPD overlap syndrome, tracheobronchomalacia and chronic hypoxic respiratory failure  -She uses 3 L pulse at rest and 4 L on ambulation   Asthma onset in 20s She was previously followed at [facility], evaluated by Dr. Myles Gip at [facility] interventional pulmonary and felt not to be a candidate for stent placement for tracheobronchomalacia     PMH - BOOP in 2094 Chronic diastolic HF, persistent AF s/p AVN ablation and pacemaker implant 2017 (has failed upgrade to BiV).    Meds -She was switched from an inhaler regimen in 2022 to budesonide/Brovana and Yupelri combination -did not tolerate budesonide/Brovana due to joint pains - did not tolerate symbicort   Last hospitalization 11/2020 for pneumonia, respiratory failure She had a fall with concussion 10/04/2021  Chief Complaint  Patient presents with   Follow-up    Pt states she was in the hospital in October for allergic reaction to Flu shots. In November she was put in the hospital with severe pneumonia and RSV. After Chistmas she had another Virus.    12/2021 OV >> changed to Advair HFA and Yupelri combinations, since could not tolerate budesonide or Symbicort -unable to tolerate NIV due to noise and oxygen issues and we will switch her to BiPAP 12/8 cm based on her previous titration study  She was hospitalized from 03/17/2022 to 03/22/2022 for sepsis felt initially thought to be related to pneumonia.  She had a fevers up to 103 and a dry hacking cough.  CTA was negative for PE or acute airspace disease.  She was treated for AECOPD with steroids and IV antibiotics.  She was weaned to her baseline supplemental O2 and discharged on [place].   She then contacted the office on 10/27 with recurrent fevers and increased cough.  She was advised to come in for an office visit.  She was also sent in Augmentin  for 7 days  04/2022 OV >> CXR with worsening RLL pna. Unable to treat her with fluoroquinolones or macrolides due to use of Tikosyn. Started on 14 day course of cefpodoxime. Pred taper for AECOPD  admitted from 04/28/2022 to 05/04/2022 for acute on chronic respiratory failure secondary to RSV pneumonia/Boop? >> prolonged steroid taper upon discharge.   She arrives with her son today.  Several issues were discussed.  She was tapered down to 10 mg of prednisone.  She feels that her nebulizer is old and takes her 15 minutes to obtain Yupelri.  Kathleen [name] works the best. For some reason she was also given DuoNebs which she is taking twice daily. She is obtaining Yupelri from [facility]. She reports left nasal bleeds for which she has seen ENT Dr. Constance Holster. She reports oral thrush but has settled down with Breo, for some reason she is on the 200 dose. I have reviewed all previous office visits and hospital course and discharge summaries  She was using up to 4.5 L of oxygen, breathing is a little worse since discharge and tapering of the prednisone.  Sugars run high while on prednisone. She remains on NIV during sleep.  The alarms continue to bother her  Significant tests/ events reviewed BiPAP titration study 01/2021 12/8 cm  ABG 03/2022 7.3 4/33/87 04/2022 CT sinuses diffuse paranasal sinus disease  03/2022 CTA chest neg  08/2021 CT chest without contrast emphysema, stable nodules   HRCT  01/2021 emphysema, tracheobronchomalacia , bland scarring both bases , new atelectasis/consolidation of the lingula with scattered groundglass in left upper lobe   6MWT 01/2020  With exertion her lowest oxygen saturation was 92% on room air. Her peak HR was 103. She walked a total of 27m which was 25% predicted     Arlyce Harman 01/2021 severe airway obstruction, ratio 59, FEV1 0.95/48%, FVC 1.61/64%  Review of Systems neg for any significant sore throat, dysphagia, itching, sneezing, nasal congestion or excess/  purulent secretions, fever, chills, sweats, unintended wt loss, pleuritic or exertional cp, hempoptysis, orthopnea pnd or change in chronic leg swelling. Also denies presyncope, palpitations, heartburn, abdominal pain, nausea, vomiting, diarrhea or change in bowel or urinary habits, dysuria,hematuria, rash, arthralgias, visual complaints, headache, numbness weakness or ataxia.     Objective:   Physical Exam  Gen. Pleasant, obese, in no distress, normal affect ENT - no pallor,icterus, no post nasal drip, class 2-3 airway Neck: No JVD, no thyromegaly, no carotid bruits Lungs: no use of accessory muscles, no dullness to percussion, decreased without rales or rhonchi  Cardiovascular: Rhythm regular, heart sounds  normal, no murmurs or gallops, no peripheral edema Abdomen: soft and non-tender, no hepatosplenomegaly, BS normal. Musculoskeletal: No deformities, no cyanosis or clubbing Neuro:  alert, non focal, no tremors       Assessment & Plan:    Oral thrush -due to inhaled steroids, nystatin will be prescribed

## 2022-06-18 NOTE — Patient Instructions (Signed)
Decrease prednisone to 5 mg 3 times a week M/W/F until in January, then stop  Use saline drops in both naris twice daily Stop using DuoNebs and use albuterol nebs instead Decrease Breo to 100 mg to avoid thrush, rinse mouth well after use  X discontinue NIV , Rx for BiPAP 12/8 cm to [facility]  X Rx for new nebulizer to [facility]  X Rx for nystatin 10,000 units 5 mL twice daily swish and swallow

## 2022-06-24 ENCOUNTER — Non-Acute Institutional Stay: Payer: Medicare Other | Admitting: Family Medicine

## 2022-06-24 ENCOUNTER — Encounter: Payer: Self-pay | Admitting: Family Medicine

## 2022-06-24 VITALS — HR 80 | Temp 97.7°F | Resp 22 | Wt 184.0 lb

## 2022-06-24 DIAGNOSIS — J449 Chronic obstructive pulmonary disease, unspecified: Secondary | ICD-10-CM

## 2022-06-24 DIAGNOSIS — I5022 Chronic systolic (congestive) heart failure: Secondary | ICD-10-CM

## 2022-06-24 DIAGNOSIS — J9611 Chronic respiratory failure with hypoxia: Secondary | ICD-10-CM

## 2022-06-24 DIAGNOSIS — Z515 Encounter for palliative care: Secondary | ICD-10-CM

## 2022-06-24 NOTE — Progress Notes (Signed)
[facility] Palliative Care Consult Note Telephone: [phone]  Fax: [phone]    Date of encounter: 06/24/22 1:45 PM PATIENT NAME: BETHANNY TOELLE [address] [address] [place] [address]   [phone] (home)  DOB: 1946/02/26 MRN: [id number] PRIMARY CARE PROVIDER:    [name], MD,  [place] [address] [phone]  REFERRING PROVIDER:   [name], MD [address] [place],  [place] [address] [phone]  RESPONSIBLE PARTY:    Contact Information     Name Relation Home Work Knippa Son [phone]  [phone]   Ibeth, Fahmy [phone]  [phone]   Alveria Apley Daughter   [phone]        I met face to face with patient in [facility]. Palliative Care was asked to follow this patient by consultation request of  [name], MD to address advance care planning and complex medical decision making. This is a follow up visit   ASSESSMENT , SYMPTOM MANAGEMENT AND PLAN / RECOMMENDATIONS:  Chronic Respiratory Failure with Hypoxia/Unspecified COPD Stable on current plan with minimal change from Pulmonologist to Breo-Ellipta 100-25 mcg daily Continues on Prednisone 10 mg daily Continues on O2@4 .5 L [place].   Heart failure with reduced EF Appears euvolemic presently, weight stable. Continue ARB, Spironolactone, Torsemide and Nexletol per existing prescribed directions. Was evaluated by Cardiologist who plans repeat echo in April to reassess EF.  3.  Palliative Care Encounter At next visit will attempt to elicit goals of care from patient and discuss normal disease progression/trajectory.  Advance Care Planning/Goals of Care: Goals include to maximize quality of life and symptom management.  CODE STATUS: Full code.  Pt has not been ready to discuss code status or transition to more comfort based care.     Follow up Palliative Care Visit:  Palliative care will continue to follow for complex medical decision making, advance care planning, and clarification of goals. Return 4 weeks or prn.   This visit was coded based on medical decision making (MDM).  PPS: 60%  HOSPICE ELIGIBILITY/DIAGNOSIS: TBD  Chief Complaint:  Palliative Care is continuing to follow patient for chronic medical management in setting of COPD, chronic respiratory failure.   HISTORY OF PRESENT ILLNESS:  COURTENAY HIRTH is a 77 y.o. 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  Not having a lot of urination.  Drinking well and having dry mouth.  Constipation is improved and doing Linzess once weekly and taking Colace BID.  Denies fever, worsening cough and congestion.  Intermittent cough productive of thick yellow sputum.  Her Breo-Ellipta 200 mcg was decreased to 100 mcg dose at recent Pulmonologist visit.  She continues on Yupelri and he wants to have her use only Albuterol if she needs it instead of DuoNeb.  She saw the Pulmonologist Dr Elsworth Soho on Jun 18, 2022. Verbalizes confusion over the change in medication regimen when she is feeling stable and the best she has in months.  Per Dr Bari Mantis notes due to sleep disruption with NIV, pt has only used about 3 hrs per night when used and only used  16 of 30 days in the month prior.  He was reordering BiPAP at 12/8 cm level with bleed in of Oxygen which was her last setting.  Recent exacerbation following RSV infection slowly resolving and he has instructed that she decrease Prednisone to 5 mg on alternate days and d/c after this month.  He  recommended continuing Yupelri and change DuoNebs to Albuterol to avoid anticholinergic toxicity, he decrease Breo Ellipta dose to help avoid thrush which he treated her for at that visit. She also reported ENT follow up and was told due to continued epistaxis from her left nare to use Afrin on cotton ball to stop bleeding. She followed up with Cardiologist Dr Haroldine Laws who advised that he believed the EF was underestimated and wanted to do a repeat with scheduled follow up in April 2024.  History obtained from review of EMR, discussion with Ms. Seppala.    I reviewed EMR for available labs, medications, imaging, studies and related documents.  Records reviewed and summarized above.   ROS General: NAD ENT:  Having some epistaxis Cardiovascular: denies chest pain, endorses DOE Pulmonary: endorses non productive cough, denies increased SOB Abdomen: endorses good appetite, denies constipation, endorses continence of bowel GU: denies dysuria, endorses continence of urine MSK:  denies increased weakness, no falls reported Skin: denies rashes or wounds Neurological: denies pain, has some insomnia with the loud noise from her Trellegy vent Psych:  mood is slightly improved Heme/lymph/immuno: denies bruises, abnormal bleeding  Physical Exam: Current and past weights:  184 lbs on home scale today, 186 lbs 9.6 oz on 06/18/22 at Pulmonology office Constitutional: NAD General: WN,WD ENMT: intact hearing, oral mucous membranes moist CV: S1S2, IRIR, no LE edema Pulmonary:  CTAB, diminished in LLL, no increased work of breathing, O2 at 4.5 L [place] Abdomen: normo-active BS + 4 quadrants, soft and non tender GU: deferred MSK: no sarcopenia, moves all extremities, ambulatory with rolling walker Skin: warm and dry, no rashes or wounds on visible skin Neuro:  no generalized weakness,  no cognitive impairment Psych: non-anxious affect, A and O x 3 Hem/lymph/immuno: no widespread bruising   Thank you for  the opportunity to participate in the care of [name].  The palliative care team will continue to follow. Please call our office at [phone] if we can be of additional assistance.   Marijo Conception, FNP -C  COVID-19 PATIENT SCREENING TOOL Asked and negative response unless otherwise noted:   Have you had symptoms of covid, tested positive or been in contact with someone with symptoms/positive test in the past 5-10 days?  NO

## 2022-06-28 ENCOUNTER — Encounter: Payer: Self-pay | Admitting: Family Medicine

## 2022-06-28 DIAGNOSIS — J449 Chronic obstructive pulmonary disease, unspecified: Secondary | ICD-10-CM | POA: Insufficient documentation

## 2022-06-28 DIAGNOSIS — Z515 Encounter for palliative care: Secondary | ICD-10-CM | POA: Insufficient documentation

## 2022-06-29 ENCOUNTER — Ambulatory Visit: Payer: Medicare Other | Attending: Cardiology

## 2022-06-29 DIAGNOSIS — I5022 Chronic systolic (congestive) heart failure: Secondary | ICD-10-CM | POA: Diagnosis not present

## 2022-06-29 DIAGNOSIS — Z950 Presence of cardiac pacemaker: Secondary | ICD-10-CM

## 2022-06-30 LAB — CUP PACEART REMOTE DEVICE CHECK
Battery Remaining Longevity: 89 mo
Battery Voltage: 2.99 V
Brady Statistic AP VP Percent: 99.68 %
Brady Statistic AP VS Percent: 0.01 %
Brady Statistic AS VP Percent: 0.3 %
Brady Statistic AS VS Percent: 0 %
Brady Statistic RA Percent Paced: 99.69 %
Brady Statistic RV Percent Paced: 99.98 %
Date Time Interrogation Session: 20240122110825
Implantable Lead Connection Status: 753985
Implantable Lead Connection Status: 753985
Implantable Lead Implant Date: 20150527
Implantable Lead Implant Date: 20211221
Implantable Lead Location: 753858
Implantable Lead Location: 753860
Implantable Lead Model: 3830
Implantable Lead Model: 5076
Implantable Pulse Generator Implant Date: 20211221
Lead Channel Impedance Value: 266 Ohm
Lead Channel Impedance Value: 266 Ohm
Lead Channel Impedance Value: 285 Ohm
Lead Channel Impedance Value: 342 Ohm
Lead Channel Impedance Value: 361 Ohm
Lead Channel Impedance Value: 380 Ohm
Lead Channel Impedance Value: 418 Ohm
Lead Channel Impedance Value: 437 Ohm
Lead Channel Impedance Value: 475 Ohm
Lead Channel Pacing Threshold Amplitude: 0.625 V
Lead Channel Pacing Threshold Amplitude: 0.75 V
Lead Channel Pacing Threshold Amplitude: 0.75 V
Lead Channel Pacing Threshold Pulse Width: 0.4 ms
Lead Channel Pacing Threshold Pulse Width: 0.4 ms
Lead Channel Pacing Threshold Pulse Width: 0.4 ms
Lead Channel Sensing Intrinsic Amplitude: 1.375 mV
Lead Channel Sensing Intrinsic Amplitude: 1.375 mV
Lead Channel Sensing Intrinsic Amplitude: 6.25 mV
Lead Channel Sensing Intrinsic Amplitude: 6.25 mV
Lead Channel Setting Pacing Amplitude: 1.25 V
Lead Channel Setting Pacing Amplitude: 1.5 V
Lead Channel Setting Pacing Amplitude: 2 V
Lead Channel Setting Pacing Pulse Width: 0.4 ms
Lead Channel Setting Pacing Pulse Width: 0.4 ms
Lead Channel Setting Sensing Sensitivity: 2 mV
Zone Setting Status: 755011

## 2022-07-01 ENCOUNTER — Ambulatory Visit: Payer: Medicare Other | Attending: Cardiology

## 2022-07-01 DIAGNOSIS — I442 Atrioventricular block, complete: Secondary | ICD-10-CM | POA: Diagnosis not present

## 2022-07-03 NOTE — Progress Notes (Signed)
EPIC Encounter for ICM Monitoring  Patient Name: Kathleen [name] is a 77 y.o. female Date: 07/03/2022 Primary Care Physican: [name], MD Primary Cardiologist: [place] Electrophysiologist: [place] Bi-V Pacing: 100%          05/19/2023 Weight: 184 lbs  07/03/2022 Weight: 188 lbs                                                            Spoke with patient and heart failure questions reviewed.  Transmission results reviewed.  Pt reports SOB, weight increase of 6-8 lbs over the last 2 weeks and PT reported hearing wheezing and crackling in lungs.      Optivol thoracic impedance suggesteing possible fluid accumulation starting 1/22.    Prescribed:  Torsemide 20 mg take 1 tablet(s) (20 mg total) by mouth every Monday and Friday.   Spironolactone 25 mg take 1 tablet daily   Labs: 05/19/2022 Creatinine 0.72, BUN 16, Potassium 4.5, Sodium 140  05/03/2022 Creatinine 0.66, BUN 22, Potassium 4.4, Sodium 137, GFR >60  05/01/2022 Creatinine 1.00, BUN 25, Potassium 4.8, Sodium 139, GFR 59  04/30/2022 Creatinine 0.76, BUN 22, Potassium 4.7, Sodium 137, GFR >60 04/29/2022 Creatinine 0.88, BUN 19, Potassium 4.3, Sodium 138, GFR >60  04/28/2022 Creatinine 1.01, BUN 26, Potassium 4.0, Sodium 139, GFR 58  04/21/2022 Creatinine 0.86, BUN 15, Potassium 4.1, Sodium 135  A complete set of results can be found in Results Review.  Recommendations:  She took prescribed dose of Torsemide this AM.  Advised to take extra Torsemide this afternoon and extra 20 mg tomorrow morning. If symptoms become urgent to use ER if needed.     Follow-up plan: ICM clinic phone appointment on 07/06/2022 to recheck fluid levels.   91 day device clinic remote transmission 09/30/2022.     EP/Cardiology Office Visits:  09/15/2022 with HF Clinic.     Copy of ICM check sent to Dr. Curt Bears and Dr Haroldine Laws as Juluis Rainier and review.   3 month ICM trend: 07/01/2022.    12-14 Month ICM trend:     Rosalene Billings, RN 07/03/2022 9:00  AM

## 2022-07-06 ENCOUNTER — Ambulatory Visit: Payer: Medicare Other | Attending: Cardiology

## 2022-07-06 DIAGNOSIS — I5022 Chronic systolic (congestive) heart failure: Secondary | ICD-10-CM

## 2022-07-06 DIAGNOSIS — Z950 Presence of cardiac pacemaker: Secondary | ICD-10-CM

## 2022-07-07 ENCOUNTER — Encounter: Payer: Self-pay | Admitting: Internal Medicine

## 2022-07-07 ENCOUNTER — Telehealth: Payer: Self-pay

## 2022-07-07 NOTE — Telephone Encounter (Signed)
Remote ICM transmission received.  Attempted call to patient regarding ICM remote transmission and left message to return call. ? ?

## 2022-07-07 NOTE — Progress Notes (Signed)
EPIC Encounter for ICM Monitoring  Patient Name: Kathleen [name] is a 77 y.o. female Date: 07/07/2022 Primary Care Physican: [name], MD Primary Cardiologist: [place] Electrophysiologist: Curt Bears Bi-V Pacing: 100%          05/19/2023 Weight: 184 lbs  07/03/2022 Weight: 188 lbs                                                     Attempted call to patient and unable to reach.  Left message to return call. Transmission reviewed.  Follow up call to check on patients previous report of fluid symptoms of SOB, 6-8 lb weight increase within 2 weeks and wheezing/crackling in lungs according to home PT.      Optivol thoracic impedance suggesting fluid levels returned to normal after recommendation to take extra Torsemide.    Prescribed:  Torsemide 20 mg take 1 tablet(s) (20 mg total) by mouth every Monday and Friday.   Spironolactone 25 mg take 1 tablet daily   Labs: 05/19/2022 Creatinine 0.72, BUN 16, Potassium 4.5, Sodium 140  05/03/2022 Creatinine 0.66, BUN 22, Potassium 4.4, Sodium 137, GFR >60  05/01/2022 Creatinine 1.00, BUN 25, Potassium 4.8, Sodium 139, GFR 59  04/30/2022 Creatinine 0.76, BUN 22, Potassium 4.7, Sodium 137, GFR >60 04/29/2022 Creatinine 0.88, BUN 19, Potassium 4.3, Sodium 138, GFR >60  04/28/2022 Creatinine 1.01, BUN 26, Potassium 4.0, Sodium 139, GFR 58  04/21/2022 Creatinine 0.86, BUN 15, Potassium 4.1, Sodium 135  A complete set of results can be found in Results Review.   Recommendations:  Unable to reach.       Follow-up plan: ICM clinic phone appointment on 08/03/2022.   91 day device clinic remote transmission 09/30/2022.     EP/Cardiology Office Visits:  09/15/2022 with HF Clinic.     Copy of ICM check sent to Dr. Curt Bears  3 month ICM trend: 07/06/2022.    12-14 Month ICM trend:     Rosalene Billings, RN 07/07/2022 10:43 AM

## 2022-07-07 NOTE — Progress Notes (Addendum)
Spoke with patient and heart failure questions reviewed.  Transmission results reviewed.  Pt reports weight fluctuation in relation to when taking Torsemide.  Her weight dropped to 183 lbs after taking extra Torsemide on 1/26. On Saturday she had a sudden onset of pain in jaw and chest after pulling out a heavy crockpot in the pantry.   On Sunday she had extreme fatigue.   She has meals fixed at her retirement home and unable to get low salt meals.    07/04/2022 Weight: 183 lbs (after taking extra Torsemide) 07/05/2022 Weight: 185 lbs 07/06/2022 Weight: 187.4 lbs 07/07/2022 Weight: 184.6 lbs  Dr Bensimhon's 07/06/2022 office visit note says to continue torsemide 20 mg 3x/week + PRN (MWF).    Advised to take extra Torsemide dosage this week if needed for weight gain.    Copy sent to Dr Haroldine Laws for review and any further recommendations if needed.

## 2022-07-08 NOTE — Progress Notes (Signed)
Spoke with patient and she said her weight is up again today and was 187.4 lbs.  She did take her normal Torsemide dosage today and will take 2 on Friday if her weight is still elevated by 4-5 lbs.  Will recheck fluid levels on 2/5

## 2022-07-13 ENCOUNTER — Other Ambulatory Visit (HOSPITAL_COMMUNITY): Payer: Self-pay | Admitting: Internal Medicine

## 2022-07-15 ENCOUNTER — Ambulatory Visit: Payer: Medicare Other | Attending: Cardiology

## 2022-07-15 DIAGNOSIS — I5022 Chronic systolic (congestive) heart failure: Secondary | ICD-10-CM

## 2022-07-15 DIAGNOSIS — Z950 Presence of cardiac pacemaker: Secondary | ICD-10-CM

## 2022-07-15 NOTE — Progress Notes (Signed)
EPIC Encounter for ICM Monitoring  Patient Name: Kathleen [name] is a 77 y.o. female Date: 07/15/2022 Primary Care Physican: [name], MD Primary Cardiologist: [place] Electrophysiologist: Camn[place] Bi-V Pacing: 100%          05/19/2023 Weight: 184 lbs  07/03/2022 Weight: 188 lbs 07/15/2022 Weight: 187.4 lbs                                                     Spoke with patient and heart failure questions reviewed.  Transmission results reviewed.  Pt reports she is still SOB and she is unable to get back to baseline weight even after taking extra Torsemide.   184.6 lbs was lowest weight in last 10 days.    Optivol thoracic impedance suggesting fluid levels returned to normal on 2/7 after taking 2 Furosemide at same time x 1 day and extra 1 tablet over the weekend.    Prescribed:  Torsemide 20 mg take 1 tablet(s) (20 mg total) by mouth every Monday and Friday.   Spironolactone 25 mg take 1 tablet daily   Labs: 05/19/2022 Creatinine 0.72, BUN 16, Potassium 4.5, Sodium 140  05/03/2022 Creatinine 0.66, BUN 22, Potassium 4.4, Sodium 137, GFR >60  05/01/2022 Creatinine 1.00, BUN 25, Potassium 4.8, Sodium 139, GFR 59  04/30/2022 Creatinine 0.76, BUN 22, Potassium 4.7, Sodium 137, GFR >60 04/29/2022 Creatinine 0.88, BUN 19, Potassium 4.3, Sodium 138, GFR >60  04/28/2022 Creatinine 1.01, BUN 26, Potassium 4.0, Sodium 139, GFR 58  04/21/2022 Creatinine 0.86, BUN 15, Potassium 4.1, Sodium 135  A complete set of results can be found in Results Review.   Recommendations:   Advised to call HF clinic for appointment to evaluation of current symptoms since extra Torsemide is not resolving symptoms.   Follow-up plan: ICM clinic phone appointment on 07/22/2022 to recheck fluid levels.  91 day device clinic remote transmission 09/30/2022.     EP/Cardiology Office Visits:  09/15/2022 with HF Clinic.     Copy of ICM check sent to Dr. Curt Bears.  3 month ICM trend: 07/14/2022.    12-14 Month ICM  trend:     Rosalene Billings, RN 07/15/2022 11:15 AM

## 2022-07-16 ENCOUNTER — Encounter (HOSPITAL_COMMUNITY): Payer: Self-pay | Admitting: *Deleted

## 2022-07-22 ENCOUNTER — Ambulatory Visit: Payer: Medicare Other | Attending: Cardiology

## 2022-07-22 DIAGNOSIS — Z950 Presence of cardiac pacemaker: Secondary | ICD-10-CM

## 2022-07-22 DIAGNOSIS — I5022 Chronic systolic (congestive) heart failure: Secondary | ICD-10-CM

## 2022-07-22 MED ORDER — POTASSIUM CHLORIDE ER 10 MEQ PO TBCR: 10.0000 meq | EXTENDED_RELEASE_TABLET | Freq: Every day | ORAL | 3 refills | Status: DC

## 2022-07-22 MED ORDER — TORSEMIDE 20 MG PO TABS: 20.0000 mg | ORAL_TABLET | Freq: Every day | ORAL | 2 refills | Status: DC

## 2022-07-22 NOTE — Progress Notes (Unsigned)
Office Visit Note  Patient: Kathleen [name]             Date of Birth: 08-13-1945           MRN: [id number]             PCP: [name], MD Referring: [name], MD Visit Date: 07/23/2022   Subjective:  No chief complaint on file.   History of Present Illness: Kathleen [name] is a 77 y.o. female here for follow up ***   Previous HPI 01/19/22 Kathleen [name] is a 77 y.o. female here for evaluation and management of chronic gout.  She has had gouty arthritis ongoing for a few years now most often affecting the right foot.  She saw a rheumatology provider in [place] for management more recently just being treated with her primary care office.  She has previously taken allopurinol stopped due to concern about allergic reaction.  She was on maintenance colchicine for flare prevention but had stopped this due to lack of efficacy.  More recently was prescribed colchicine during severe flareup of pain in her left hand with no obvious clinical response.  She was treated with probenecid apparently stopped due to poor clinical response, not sure if this was uric acid or other parameters.  More recently appears to have been treated with uloric since 2020 but with increased frequency and duration of gout flares in the past year. She was admitted to the hospital in December apparently with sepsis secondary to some chest wall cellulitis.  During his hospital stay she developed right and then the left-sided foot swelling and severe pain.  She also had a recurrence of the right foot symptoms within a few days after hospital discharge.  This was treated with prednisone with clinical improvement.  Symptoms remained okay with her feet but in June developed sudden left fourth PIP joint pain swelling and erythema.  This was thought to represent a new gout flare and treated with oral colchicine.  Symptoms are ongoing for about a month before resolving on their own she did not see any difference when  taking the medicine.  She has been taking her subluxes at 40 mg daily consistently throughout this time.  Right now joints are pretty much all back to baseline.   No Rheumatology ROS completed.   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) 12/03/2021   Low TSH level 12/02/2021   Acute idiopathic gout of left hand 11/19/2021   Acquired hypothyroidism 11/12/2021   Encounter for palliative care involving management of pain 11/12/2021   Post concussive syndrome 11/11/2021   Chronic left-sided low back pain 11/04/2021   Gout 10/01/2021   Hyperlipidemia with target LDL less than 100 08/07/2021   Chronic anticoagulation 07/07/2021   Stage 3a chronic kidney disease ([facility]) 05/23/2021   Dietary iron deficiency 05/23/2021   Age-related osteoporosis without current pathological fracture 04/22/2021   Type 2 diabetes mellitus with diabetic neuropathy, without long-term current use of insulin ([facility]) 04/22/2021  Sleep apnea 01/22/2021   Statin intolerance 10/12/2019   Statin myopathy [id number]   Chronic systolic heart failure ([facility]) 04/06/2018   HTN (hypertension) 04/06/2018   Atrial fibrillation ([facility]) 04/06/2018   Chronic respiratory failure with hypoxia ([place]) 03/28/2018   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 12/02/2015   Malnutrition of moderate degree 09/11/2015    Past Medical History:  Diagnosis Date   Asthma    BOOP  (bronchiolitis obliterans with organizing pneumonia) ([facility])    CHF (congestive heart failure) ([facility])    Chronic renal insufficiency    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    Concussion 10/04/2021   COPD (chronic obstructive pulmonary disease) (H[facility])    Gout    Hypertension    Longstanding persistent atrial fibrillation (HCC)    on Xarelto   Nonischemic cardiomyopathy (HCC)    Obesity    Pacemaker    Spontaneous pneumothorax 2013    Family History  Problem Relation Age of Onset   Breast cancer Mother 91       3 different times   Pancreatic cancer Mother    Emphysema Father    Healthy Brother    Breast cancer Cousin    Valvular heart disease Son    Obesity Daughter    Colon cancer Neg Hx    Esophageal cancer Neg Hx    Stomach cancer Neg Hx    Inflammatory bowel disease Neg Hx    Liver disease Neg Hx    Rectal cancer Neg Hx    Past Surgical History:  Procedure Laterality Date   ABDOMINAL HYSTERECTOMY     APPENDECTOMY     ATRIAL FIBRILLATION ABLATION  07/20/2013   by Dr Westley Gambles   AV nodal ablation  11/01/2013   by Dr Westley Gambles, repeated by Dr Dwana Curd   BREAST BIOPSY Bilateral 1997   negative   CARDIAC CATHETERIZATION     CHOLECYSTECTOMY     HERNIA REPAIR     PACEMAKER INSERTION  06/2017   MDT Viva CRT-P implanted by Dr Westley Gambles after AV nodal ablation,  LV lead could not be placed   PACEMAKER INSERTION  05/2020   with lead bundle   RIGHT/LEFT HEART CATH AND CORONARY ANGIOGRAPHY N/A 03/20/2022   Procedure: RIGHT/LEFT HEART CATH AND CORONARY ANGIOGRAPHY;  Surgeon: Martinique, Peter M, MD;  Location: [facility];  Service: Cardiovascular;  Laterality: N/A;   Social History   Social History Narrative   Pt lives in [place] alone.  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  Her son works in [employer].   Immunization History  Administered Date(s) Administered   Fluad Quad(high Dose 65+) 04/22/2021, 03/16/2022   Hepatitis A, Adult 04/12/2001    Influenza, High Dose Seasonal PF 03/16/2019   PFIZER Comirnaty(Gray Top)Covid-19 Tri-[place]uc[place] Vaccine 03/29/2020, 10/09/2020   PFIZER(Purple Top)SARS-COV-2 Vaccination 07/13/2019, 08/08/2019   Pneumococcal Conjugate-13 08/30/2014   Pneumococcal Polysaccharide-23 03/31/2019   Tdap 10/02/2013   Yellow Fever 04/12/2001     Objective: Vital Signs: There were no vitals taken for this visit.   Physical Exam   Musculoskeletal Exam: ***  CDAI Exam: CDAI Score: -- Patient Global: --; Provider Global: -- Swollen: --; Tender: -- Joint Exam 07/23/2022   No joint exam has been documented for this visit   There is currently no information documented on the homunculus. Go to the Rheumatology activity and complete the homunculus joint exam.  Investigation: No additional findings.  Imaging: CUP PACEART REMOTE DEVICE CHECK  Result  Date: 06/30/2022 Scheduled remote reviewed. Normal device function.  Next remote 91 days- JJB   Recent Labs: Lab Results  Component Value Date   WBC 10.1 05/22/2022   HGB 13.6 05/22/2022   PLT 234.0 05/22/2022   NA 140 05/19/2022   K 4.5 05/19/2022   CL 103 05/19/2022   CO2 28 05/19/2022   GLUCOSE 161 (H) 05/19/2022   BUN 16 05/19/2022   CREATININE 0.72 05/19/2022   BILITOT 0.6 04/28/2022   ALKPHOS 26 (L) 04/28/2022   AST 14 (L) 04/28/2022   ALT 14 04/28/2022   PROT 6.6 04/28/2022   ALBUMIN 4.1 04/28/2022   CALCIUM 9.8 05/19/2022   GFRAA 58 (L) 06/28/2019    Speciality Comments: No specialty comments available.  Procedures:  No procedures performed Allergies: Allopurinol, Clindamycin, Flublok [influenza vaccine recombinant], Pneumococcal 13-val conj vacc, Dronedarone, Brovana [arformoterol], Budesonide, Entresto [sacubitril-valsartan], Fosamax [alendronate sodium], Jardiance [empagliflozin], Meperidine, Microplegia msa-msg [plegisol], Rosuvastatin, Tetracycline, Adhesive [tape], and Lovastatin   Assessment / Plan:     Visit Diagnoses: No  diagnosis found.  ***  Orders: No orders of the defined types were placed in this encounter.  No orders of the defined types were placed in this encounter.    Follow-Up Instructions: No follow-ups on file.   [name], MD  Note - This record has been created using [employer].  Chart creation errors have been sought, but may not always  have been located. Such creation errors do not reflect on  the standard of medical care.

## 2022-07-22 NOTE — Progress Notes (Signed)
  Received: Today [place], Maricela Bo, FNP  Joannah Gitlin Panda, RN Increase torsemide to 20 mg daily. Add 20 KCL daily. Repeat BMET in 10 days, thanks!

## 2022-07-22 NOTE — Progress Notes (Signed)
[place], NP that patient is also taking spironolactone and received updated potassium instructions.   Received: Today [place], Maricela Bo, FNP  Jerrald Doverspike Panda, RN Thank you, I did not see that. Let's do 10 KCL daily with repeat BMEt in 10 days. Thanks!

## 2022-07-22 NOTE — Progress Notes (Signed)
Spoke with patient and advised Allena Katz, NP at HF clinic recommended she take Torsemide 20 mg by mouth daily and new prescription of Potassium 10 mEq 1 tablet by mouth daily.  Allergy/Contraindication of Plegisol showed when Potassium ordered and Janett Billow approved she to continue with Potassium prescription (per secure chat).  BMET scheduled for 2/26.  Pt verbalized understanding of plan and agreed.

## 2022-07-22 NOTE — Progress Notes (Signed)
EPIC Encounter for ICM Monitoring  Patient Name: Kathleen [name] is a 77 y.o. female Date: 07/22/2022 Primary Care Physican: [name], MD Primary Cardiologist: [place] Electrophysiologist: [place] Bi-V Pacing: 100%          05/19/2023 Weight: 184 lbs  06/30/2022 Weight: 181 lbs 07/03/2022 Weight: 188 lbs 07/15/2022 Weight: 187.4 lbs 07/22/2022 Weight: 191 lbs                                                     Spoke with patient and heart failure questions reviewed.  Transmission results reviewed.  Pt reports a lot of swelling in feet and approximately 10 lb weight gain in the last 3 weeks.      Optivol thoracic impedance suggesting possible fluid accumulation starting 1/22 and unable to maintain baseline normal after taking extra Torsemide each week since 1/22.    Prescribed:  Torsemide 20 mg take 1 tablet(s) (20 mg total) by mouth every Monday and Friday.   Spironolactone 25 mg take 1 tablet daily   Labs: 05/19/2022 Creatinine 0.72, BUN 16, Potassium 4.5, Sodium 140  05/03/2022 Creatinine 0.66, BUN 22, Potassium 4.4, Sodium 137, GFR >60  05/01/2022 Creatinine 1.00, BUN 25, Potassium 4.8, Sodium 139, GFR 59  04/30/2022 Creatinine 0.76, BUN 22, Potassium 4.7, Sodium 137, GFR >60 04/29/2022 Creatinine 0.88, BUN 19, Potassium 4.3, Sodium 138, GFR >60  04/28/2022 Creatinine 1.01, BUN 26, Potassium 4.0, Sodium 139, GFR 58  04/21/2022 Creatinine 0.86, BUN 15, Potassium 4.1, Sodium 135  A complete set of results can be found in Results Review.   Recommendations:   She attempted to contact HF clinic for appointment and unable to get through to speak with someone.  Copy sent to Allena Katz, NP and Dr Haroldine Laws for review and recommendations.  For last several weeks, extra Torsemide is not resolving symptoms and she does not have good urine output even if doubling the dosage.    Follow-up plan: ICM clinic phone appointment on 07/28/2022 to recheck fluid levels.  91 day device clinic  remote transmission 09/30/2022.     EP/Cardiology Office Visits:  09/15/2022 with HF Clinic.     Copy of ICM check sent to Dr. Curt Bears.  3 month ICM trend: 07/22/2022.    12-14 Month ICM trend:     Rosalene Billings, RN 07/22/2022 7:31 AM

## 2022-07-22 NOTE — Progress Notes (Signed)
Remote pacemaker transmission.   

## 2022-07-23 ENCOUNTER — Ambulatory Visit: Payer: Medicare Other | Attending: Internal Medicine | Admitting: Internal Medicine

## 2022-07-23 ENCOUNTER — Encounter: Payer: Self-pay | Admitting: Internal Medicine

## 2022-07-23 VITALS — BP 120/75 | HR 77 | Resp 20 | Ht 60.5 in | Wt 189.0 lb

## 2022-07-23 DIAGNOSIS — M1A9XX0 Chronic gout, unspecified, without tophus (tophi): Secondary | ICD-10-CM | POA: Insufficient documentation

## 2022-07-23 DIAGNOSIS — M25512 Pain in left shoulder: Secondary | ICD-10-CM | POA: Diagnosis not present

## 2022-07-23 DIAGNOSIS — M10042 Idiopathic gout, left hand: Secondary | ICD-10-CM | POA: Diagnosis not present

## 2022-07-23 DIAGNOSIS — M25559 Pain in unspecified hip: Secondary | ICD-10-CM | POA: Diagnosis present

## 2022-07-23 DIAGNOSIS — G8929 Other chronic pain: Secondary | ICD-10-CM | POA: Diagnosis present

## 2022-07-23 DIAGNOSIS — N1831 Chronic kidney disease, stage 3a: Secondary | ICD-10-CM | POA: Diagnosis not present

## 2022-07-23 MED ORDER — TRIAMCINOLONE ACETONIDE 40 MG/ML IJ SUSP
40.0000 mg | INTRAMUSCULAR | Status: AC | PRN
  Administered 2022-07-23: 40 mg via INTRA_ARTICULAR

## 2022-07-23 MED ORDER — LIDOCAINE HCL 1 % IJ SOLN
3.0000 mL | INTRAMUSCULAR | Status: AC | PRN
  Administered 2022-07-23: 3 mL

## 2022-07-23 MED ORDER — CYCLOBENZAPRINE HCL 5 MG PO TABS: 5.0000 mg | ORAL_TABLET | Freq: Every evening | ORAL | 0 refills | Status: DC | PRN

## 2022-07-27 ENCOUNTER — Telehealth: Payer: Self-pay

## 2022-07-27 NOTE — Telephone Encounter (Signed)
Attempted to return patient call as requested by voice mail message regarding if potassium can cause side effect of face redness.   Left message stating to call the pharmacists regarding all side effects of Potassium.  Checked side effects online for Potassium and face redness is not listed but the following was listed as possible side effects which include but not limited to trouble breathing, weakness, GI upset or changes in heartbeat.  Patients voice mail message stated she was only having redness in her face.

## 2022-07-28 ENCOUNTER — Ambulatory Visit: Payer: Medicare Other | Attending: Cardiology

## 2022-07-28 DIAGNOSIS — Z950 Presence of cardiac pacemaker: Secondary | ICD-10-CM

## 2022-07-28 DIAGNOSIS — I5022 Chronic systolic (congestive) heart failure: Secondary | ICD-10-CM

## 2022-07-28 NOTE — Progress Notes (Signed)
  Received: Today [place], Maricela Bo, FNP  Jenene Kauffmann Panda, RN No changes for now, thanks Gervase Colberg!

## 2022-07-28 NOTE — Progress Notes (Signed)
EPIC Encounter for ICM Monitoring  Patient Name: Kathleen [name] is a 77 y.o. female Date: 07/28/2022 Primary Care Physican: [name], MD Primary Cardiologist: [place] Electrophysiologist: [place] Bi-V Pacing: 100%          05/19/2023 Weight: 184 lbs  06/30/2022 Weight: 181 lbs 07/03/2022 Weight: 188 lbs 07/15/2022 Weight: 187.4 lbs 07/22/2022 Weight: 191 lbs 07/28/2022 Weight: 184.6 lbs                                                     Spoke with patient and heart failure questions reviewed.  Transmission results reviewed.  Pt reports 6.5 lb weight loss and decrease swelling of feet since changing Torsemide to 20 mg daily.  She reports breathing is better.     Optivol thoracic impedance suggesting fluid levels returned to norm to slight dryness after Torsemide 20 mg dosage changed to 1 tablet daily.   Fluid index returned to normal.   Prescribed:  Torsemide 20 mg take 1 tablet(s) (20 mg total) by mouth daily (changed to daily 2/14).   Potassium 10 mEq take 1 tablet (10 mEq total) by mouth daily (started 2/14) Spironolactone 25 mg take 1 tablet daily   Labs: 08/03/2022 BMET Scheduled 05/19/2022 Creatinine 0.72, BUN 16, Potassium 4.5, Sodium 140  05/03/2022 Creatinine 0.66, BUN 22, Potassium 4.4, Sodium 137, GFR >60  05/01/2022 Creatinine 1.00, BUN 25, Potassium 4.8, Sodium 139, GFR 59  04/30/2022 Creatinine 0.76, BUN 22, Potassium 4.7, Sodium 137, GFR >60 04/29/2022 Creatinine 0.88, BUN 19, Potassium 4.3, Sodium 138, GFR >60  04/28/2022 Creatinine 1.01, BUN 26, Potassium 4.0, Sodium 139, GFR 58  04/21/2022 Creatinine 0.86, BUN 15, Potassium 4.1, Sodium 135  A complete set of results can be found in Results Review.   Recommendations:   Copy sent to Allena Katz NP at [facility] clinic for review after changing Torsemide 20 mg dosage to 1 tablet daily.   Follow-up plan: ICM clinic phone appointment on 08/03/2022 for 31 day and to recheck fluid levels.  91 day device clinic remote  transmission 09/30/2022.     EP/Cardiology Office Visits:  09/15/2022 with HF Clinic.     Copy of ICM check sent to Dr. Curt Bears.  3 month ICM trend: 07/28/2022.    12-14 Month ICM trend:     Rosalene Billings, RN 07/28/2022 9:31 AM

## 2022-07-29 ENCOUNTER — Non-Acute Institutional Stay: Payer: Medicare Other | Admitting: Family Medicine

## 2022-07-29 ENCOUNTER — Encounter: Payer: Self-pay | Admitting: Family Medicine

## 2022-07-29 VITALS — HR 89 | Temp 97.3°F | Resp 18 | Wt 184.2 lb

## 2022-07-29 DIAGNOSIS — I5022 Chronic systolic (congestive) heart failure: Secondary | ICD-10-CM

## 2022-07-29 DIAGNOSIS — J9611 Chronic respiratory failure with hypoxia: Secondary | ICD-10-CM

## 2022-07-29 DIAGNOSIS — J449 Chronic obstructive pulmonary disease, unspecified: Secondary | ICD-10-CM

## 2022-07-29 NOTE — Progress Notes (Signed)
[facility] Consult Note Telephone: [phone]  Fax: [phone]    Date of encounter: 07/29/22 1:09 PM PATIENT NAME: Kathleen [name] [name] [id number]   [phone] (home)  DOB: 03-11-1946 MRN: [id number] PRIMARY CARE PROVIDER:    [name], MD,  [place] [address] [phone]  REFERRING PROVIDER:   [name], MD [address] [place],  [place] [address] [phone]  RESPONSIBLE PARTY:    Contact Information     Name Relation Home Work [place] Son [phone]  [phone]   Wilda, [place] [phone]  [phone]   Alveria Apley Daughter   [phone]        I met face to face with patient in [facility]. Palliative Care was asked to follow this patient by consultation request of  Kathleen Lima, MD to address advance care planning and complex medical decision making. This is a follow up visit   ASSESSMENT , SYMPTOM MANAGEMENT AND PLAN / RECOMMENDATIONS:  Heart failure with reduced EF Stable at present, euvolemic presently with weight stabilizing after recent adjustment in Torsemide to 20 mg BID. Continue ARB, Spironolactone, Torsemide with potassium supplement and Nexletol to retain rate control per existing prescribed directions.  Chronic Respiratory Failure with Hypoxia/Unspecified COPD Stable on current plan. Continue Breo-Ellipta 100-25 mcg daily, Yupelri neb, Astelin nasal spray and Prednisone 10 mg daily Continues on O2'@4'$ .5 L [place].    Advance Care Planning/Goals of Care: Goals include to maximize quality of life and symptom management.  CODE STATUS: Full code     Follow up Palliative Care Visit: Palliative care will continue to follow for complex medical decision making, advance care planning, and clarification of goals. Return 4 weeks or prn.   This visit was coded based on medical decision making  (MDM).  PPS: 60%  HOSPICE ELIGIBILITY/DIAGNOSIS: TBD  Chief Complaint:  Palliative Care continues to follow pt with chronic respiratory failure with hypoxia due to heart failure and COPD.  HISTORY OF PRESENT ILLNESS:  Kathleen [name] is a 77 y.o. 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  Saw Rheumatologist recently and injected left shoulder. 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  She states despite being compliant with her Torsemide and drinking lots of fluid she has dry mouth, had decreased urine output and gained 10 lbs.  She was advised by Cardiology to double her Torsemide and come in on 08/03/22 for labs. Has since lost 7 lbs. She had Prolia injection yesterday. States not coughing currently. Endorses othopnea and sensation of PND with Trellegy vent.  "I almost can't get up high enough on pillows." Not sleeping more or having activity intolerance.  She continues taking Mucinex.  Chronic constipation which only comes out watery with use of Linzess.  Had pacemaker interrogation on 06/29/22 with Cardiologist indicating: Normal remote reviewed. Battery and lead parameters stable.    History obtained from review of EMR, discussion with Kathleen [name].    I reviewed EMR for available labs, medications, imaging, studies and related documents.  No new records since last visit.  ROS General: NAD Cardiovascular: denies chest pain, endorses DOE, orthopnea Pulmonary: rare NP cough, endorses recent increased SOB Abdomen: endorses good appetite, endorses chronic constipation, endorses continence of bowel GU: endorses  recent decrease in urine output until Torsemide dose doubled, endorses continence of urine MSK:  denies increased weakness, no falls reported Skin: denies rashes or wounds Neurological: denies pain, insomnia Psych:  mood is stable Heme/lymph/immuno: denies bruises, abnormal bleeding  Physical Exam: Current and past weights:  184.2 lbs on home scale today down from 191 lbs on Friday Constitutional: NAD General: WN,WD ENMT: intact hearing CV: S1S2, RRR with LUSB murmur, no LE edema Pulmonary:  CTAB, no increased work of breathing, O2 at 4.5 L [place] Abdomen: normo-active BS + 4 quadrants, soft and non tender GU: deferred MSK: no sarcopenia, moves all extremities, ambulatory independently with increased steadiness of gait, uses rolling walker outside of apartment or when unsteady Skin: warm and dry, no rashes or wounds on visible skin Neuro:  no generalized weakness,  no cognitive impairment Psych: non-anxious affect, A and O x 3 Hem/lymph/immuno: no widespread bruising   Thank you for the opportunity to participate in the care of Kathleen [name].  The palliative care team will continue to follow. Please call our office at [phone] if we can be of additional assistance.   Kathleen Conception, FNP -C  COVID-19 PATIENT SCREENING TOOL Asked and negative response unless otherwise noted:   Have you had symptoms of covid, tested positive or been in contact with someone with symptoms/positive test in the past 5-10 days?  No

## 2022-07-30 ENCOUNTER — Ambulatory Visit (INDEPENDENT_AMBULATORY_CARE_PROVIDER_SITE_OTHER): Payer: Medicare Other | Admitting: Nurse Practitioner

## 2022-07-30 ENCOUNTER — Encounter: Payer: Self-pay | Admitting: Nurse Practitioner

## 2022-07-30 VITALS — BP 122/74 | HR 81 | Ht 60.5 in | Wt 187.2 lb

## 2022-07-30 DIAGNOSIS — J9611 Chronic respiratory failure with hypoxia: Secondary | ICD-10-CM | POA: Diagnosis not present

## 2022-07-30 DIAGNOSIS — M25511 Pain in right shoulder: Secondary | ICD-10-CM

## 2022-07-30 DIAGNOSIS — J4489 Other specified chronic obstructive pulmonary disease: Secondary | ICD-10-CM

## 2022-07-30 DIAGNOSIS — G8929 Other chronic pain: Secondary | ICD-10-CM

## 2022-07-30 DIAGNOSIS — G4730 Sleep apnea, unspecified: Secondary | ICD-10-CM | POA: Diagnosis not present

## 2022-07-30 MED ORDER — FLUTICASONE FUROATE-VILANTEROL 100-25 MCG/ACT IN AEPB: 1.0000 | INHALATION_SPRAY | Freq: Every day | RESPIRATORY_TRACT | 5 refills | Status: DC

## 2022-07-30 NOTE — Assessment & Plan Note (Addendum)
At some point, [facility] transitioned her to NIV from BiPAP. Unclear what prompted the change. She tells me the machine broke and they brought the NIV out to replace it. She has tried to be consistent with using it; however, the alarms disrupt her sleep and she doesn't feel like it provides her with much benefit given this. She does not have any hypercarbia. Orders were placed by Dr. Elsworth Soho 1/11 to d/c NIV and restart BiPAP therapy at 12/8 cmH2O, which maintained her during her BiPAP titration in 2022.  For some reason, the DME company declined to provide her with the prescribed therapy and instead, gave her a new NIV machine. We are attempting to reach them to determine why this occurred and get her started on the correct therapy.

## 2022-07-30 NOTE — Progress Notes (Signed)
$@Patienth$  ID: Kathleen [name], female    DOB: 04-06-1946, 77 y.o.   MRN: [id number]  Chief Complaint  Patient presents with   Follow-up    Pt f/u states that she feels stronger than usual but still has some fatigue. She is taking mucinex that seems to help a lot with chest congestion. Currently using 4L POC and 4.5L continuous @ home    Referring provider: [name], MD  HPI: 77 year old female, former smoker followed for asthma/COPD, tracheobronchomalacia, chronic respiratory failure on supplemental O2, OSA on BiPAP. She is a patient of Dr. Bari Mantis and last seen in office on 05/19/2022 by Arieanna Pressey,NP. Past medical history significant for history of NSTEMI, hypertension, PAF on Xarelto, CHF, type 2 diabetes, HLD, obesity.  She was hospitalized from 03/17/2022 to 03/22/2022 for sepsis felt initially thought to be related to pneumonia.  She had a fevers up to 103 and a dry hacking cough.  CTA was negative for PE or acute airspace disease.  She was treated for AECOPD with steroids and IV antibiotics.  She was weaned to her baseline supplemental O2 and discharged on [place].  She then contacted the office on 10/27 with recurrent fevers and increased cough.  She was advised to come in for an office visit.  She was also sent in Augmentin for 7 days.  She was seen 04/06/2022 by Dr. Melvyn Novas for acute visit.  She was advised to finish antibiotics and continue triple therapy regimen.  TEST/EVENTS:  01/2021 BiPAP titration: 12/8 cmH2O 01/2021 spiro: severe airway obstruction; ratio 59, FEV1 48%, FVC 64% 03/2022 ABG: 7.34/33/87 03/18/2022 CTA chest: No evidence of PE.  Ectasia of main pulmonary artery, suggesting PAH.  Centrilobular emphysema.  Small linear densities in the lower lung fields, suggest scarring or atelectasis.  No focal pulmonary consolidation.  04/21/2022: OV with Elon Lomeli NP for follow-up with her son.  Unfortunately, over the last 2 to 3 days she had recurrent fevers and has developed  an increased productive cough with yellow sputum.  She feels very fatigued and rundown.  Breathing is more short than it normally is.  She also has chest congestion and wheezing.  She did have an episode of hemoptysis yesterday.  No further occurrences since.  She denies any calf pain or swelling.  She is compliant with her Xarelto.  Has not missed any doses.  She is eating and drinking well.  She is using Breo once a day and Yupelri once a day.  Uses her albuterol rescue inhaler twice daily.  Does not have any rescue medicine for nebulizer machine.  She completed previously prescribed Augmentin around 11/3.  She increased her supplemental oxygen at home to 5 L.  Previously on 4.  Not routinely checking her oxygen levels at home.CXR with worsening RLL pna. Unable to treat her with fluoroquinolones or macrolides due to use of Tikosyn. Started on 14 day course of cefpodoxime. Pred taper for AECOPD. Sputum cultures ordered. CBC with normalized hgb and without leukocytosis.   04/28/2022: OV with Rmani Kapusta NP for follow-up.  She was seen last week with recurrent fevers and productive cough.  CXR showed worsening right lower lobe opacities.  She had increased oxygen requirements up to 5 L, previously on 4 L.  She had just completed Augmentin around 11/3 for a 7-day course.  Unfortunately, unable to treat her with flora quinolones or macrolides due to her Tikosyn use.  She was started on cefpodoxime for a 14-day course.  She is  currently on day 7.  She was also treated with prednisone taper due to AECOPD.  Today, she tells me that she is not feeling any better.  She actually feels worse.  She feels significantly fatigued.  Feels like her breathing has declined.  Cough is less productive but still feels congested.  It is paroxysmal at times.  She has been struggling to drink fluids as it is hard to breathe and drink at the same time.  She has been able to eat okay but feels like her appetite is not what it normally is.  No  recurrence of fevers.  She denies any hemoptysis, lower extremity swelling, orthopnea, palpitations.  She was not able to collect sputum cultures previously ordered.  No increased oxygen demand.  She is still on the 5 L that she increased her self to last time.  She did note that her oxygen briefly dropped to 85% this morning but she quickly recovered back up into the 90s.  Given her clinical worsening, she was advised admission to the hospital for further treatment.  05/19/2022: Ok Edwards with Dontavis Tschantz NP for hospital follow-up.  After I saw her last, she was admitted from 04/28/2022 to 05/04/2022 for acute on chronic respiratory failure secondary to RSV pneumonia/[place]?[name]  She was initially treated with IV antibiotics which were discontinued and continued on IV steroids.  She was transition to prolonged steroid taper upon discharge.  She was also found to have chronic paranasal sinus disease, suspected to be contributing to her cough.  Today, she tells me that she is feeling better but not quite back to her baseline.  She still has a persistent cough which is quite bothersome. She feels like her cough started getting worse when she tapered down to the 20 mg of prednisone, which she has 1 day left of.  Cough is dry but feels congested.  Breathing feels relatively stable.  She does feel like she is wheezing a little bit more.  No fevers, chills, hemoptysis, increased lower extremity swelling.  Oxygen stable on 4 to 5 L/min.  Eating and drinking well.  She is using Tessalon as needed for cough and Tussionex at bedtime.  She uses her Breo once a day followed by her Yupelri.  She is also using her DuoNebs twice a day.  She is trying to be more consistent with her BiPAP at night but this is difficult with her cough.  She has a follow-up appointment with cardiology later this month.  She also has an appoint with ENT for overdue follow-up/further recommendations on the 28th.  06/18/2022: OV with Dr. Elsworth Soho. She was tapered down to 10  mg of prednisone daily. Neb machine is old so it takes a long time to get Yupelri in; however, she feels Maretta Bees works the best for her. Having left nosebleeds; followed by Dr. Constance Holster with ENT. She has oral thrush. At some point was put on Breo 200. Exacerbation due to RSV infection. Plan to taper off prednisone. Did not feel as though NIV was helping; also having trouble with the alarms disrupting her sleep. ABG without hypercarbia. Felt she would do well with BiPAP, previous titration with 12/8 cmH2O. D/c NIV and restart BiPAP with O2 bled through.   07/30/2022: Today - follow up Patient presents today for follow up. She is feeling the best she's felt in a few months. Doing much better compared to when I saw her last. She still has dyspnea on exertion but she feels back to baseline. Cough remains congested  but stable. She denies any increased chest congestion, fevers, chills, leg swelling. She is using [place]. Using her albuterol nebs 1-2 times a day. Nose bleeds have gotten somewhat better.  Unfortunately after her last visit, NIV was d/c and BiPAP was ordered but Dr. Elsworth Soho but when [facility] came out a few weeks ago, they told her BiPAP was not appropriate for her and they brought her another NIV machine. She tells me that she was told she doesn't ventilate enough for a BiPAP by the person that came to her house. She is very frustrated and confused by this. She also feels like the new machine is not helping her and not blowing enough air. She struggled to wear her old NIV due to alarms so only averaged around 3 hours of usage.   Allergies  Allergen Reactions   Allopurinol Other (See Comments)    Reaction:  Dizziness    Clindamycin Anaphylaxis and Hives   Flublok [Influenza Vaccine Recombinant] Other (See Comments)    Fever 103 with no alternative explanation day after vaccine.  Santiago Glad Highfill FNP-C   Pneumococcal 13-Val Conj Vacc Itching, Swelling and Rash   Dronedarone Rash   Brovana  [Arformoterol]     Caused muscle pain   Budesonide     Caused extreme joint pain   Entresto [Sacubitril-Valsartan] Other (See Comments)    hypotension   Fosamax [Alendronate Sodium] Nausea Only   Jardiance [Empagliflozin]     Caused a vaginal infection   Meperidine Nausea And Vomiting   Microplegia Msa-Msg [Plegisol]     Loopy,diarrhea   Rosuvastatin Other (See Comments)    Reaction:  Muscle spasms    Tetracycline Hives   Adhesive [Tape] Rash   Lovastatin Rash and Other (See Comments)    Muscle Pain    Immunization History  Administered Date(s) Administered   Fluad Quad(high Dose 65+) 04/22/2021, 03/16/2022   Hepatitis A, Adult 04/12/2001   Influenza, High Dose Seasonal PF 03/16/2019   PFIZER Comirnaty(Gray Top)Covid-19 Tri-Sucrose Vaccine 03/29/2020, 10/09/2020   PFIZER(Purple Top)SARS-COV-2 Vaccination 07/13/2019, 08/08/2019   Pneumococcal Conjugate-13 08/30/2014   Pneumococcal Polysaccharide-23 03/31/2019   Tdap 10/02/2013   Yellow Fever 04/12/2001    Past Medical History:  Diagnosis Date   Asthma    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[place])    CHF (congestive heart failure) ([place])    Chronic renal insufficiency    Complete heart block ([place]) s/p AV nodal ablation    Concussion 10/04/2021   COPD (chronic obstructive pulmonary disease) ([place])    Gout    Hypertension    Longstanding persistent atrial fibrillation (HCC)    on Xarelto   Nonischemic cardiomyopathy ([place])    Obesity    Pacemaker    Spontaneous pneumothorax 2013    Tobacco History: Social History   Tobacco Use  Smoking Status Former   Packs/day: 1.00   Years: 20.00   Total pack years: 20.00   Types: Cigarettes   Quit date: 06/09/1987   Years since quitting: 35.1   Passive exposure: Past  Smokeless Tobacco Never  Tobacco Comments   Former smoker 12/25/21   Counseling given: Not Answered Tobacco comments: Former smoker 12/25/21   Outpatient Medications Prior to Visit   Medication Sig Dispense Refill   acetaminophen (TYLENOL) 650 MG CR tablet Take 1,300 mg by mouth every 8 (eight) hours as needed for pain.     albuterol (PROVENTIL) (2.5 MG/3ML) 0.083% nebulizer solution Take 2.5 mg by nebulization every 6 (six) hours as needed  for wheezing or shortness of breath.     albuterol (VENTOLIN HFA) 108 (90 Base) MCG/ACT inhaler Inhale 2 puffs into the lungs every 4 (four) hours as needed for wheezing or shortness of breath. 8 g 2   azelastine (ASTELIN) 0.1 % nasal spray USE 2 SPRAYS IN EACH NOSTRIL TWICE DAILY 90 mL 0   Bempedoic Acid (NEXLETOL) 180 MG TABS Take 1 tablet by mouth daily. 90 tablet 0   Cholecalciferol (VITAMIN D3) 50 MCG (2000 UT) TABS Take 2,000 Units by mouth at bedtime.     cyanocobalamin (VITAMIN B12) 1000 MCG/ML injection Inject 1 mL (1,000 mcg total) into the muscle every 30 (thirty) days. 10 mL 0   cyclobenzaprine (FLEXERIL) 5 MG tablet Take 1 tablet (5 mg total) by mouth at bedtime as needed (Lateral hip pain). 30 tablet 0   docusate sodium (COLACE) 100 MG capsule Take 100 mg by mouth 2 (two) times daily.     dofetilide (TIKOSYN) 250 MCG capsule Take 1 capsule (250 mcg total) by mouth in the morning and at bedtime. 60 capsule 6   febuxostat (ULORIC) 40 MG tablet TAKE 1 TABLET(40 MG) BY MOUTH EVERY NIGHT 90 tablet 0   guaiFENesin (MUCINEX) 600 MG 12 hr tablet Take 600 mg by mouth 2 (two) times daily.     linaclotide (LINZESS) 145 MCG CAPS capsule Take 1 capsule (145 mcg total) by mouth daily as needed (constipation). 90 capsule 0   losartan (COZAAR) 25 MG tablet TAKE 1 TABLET(25 MG) BY MOUTH AT BEDTIME 90 tablet 3   Magnesium 500 MG TABS Take 500 mg by mouth at bedtime.     nystatin cream (MYCOSTATIN) Apply to affected area 2 times daily 90 g 1   OXYGEN Inhale 4.5 L into the lungs continuous. Use with resmed ventilator     potassium chloride (KLOR-CON) 10 MEQ tablet Take 1 tablet (10 mEq total) by mouth daily. 90 tablet 3   revefenacin (YUPELRI)  175 MCG/3ML nebulizer solution Take 3 mLs (175 mcg total) by nebulization daily. 90 mL 11   spironolactone (ALDACTONE) 25 MG tablet TAKE 1 TABLET(25 MG) BY MOUTH EVERY MORNING 90 tablet 3   torsemide (DEMADEX) 20 MG tablet Take 1 tablet (20 mg total) by mouth daily. (Patient taking differently: Take 20 mg by mouth 2 (two) times daily.) 90 tablet 2   traMADol (ULTRAM) 50 MG tablet Take 1 tablet (50 mg total) by mouth every 6 (six) hours as needed. 360 tablet 1   XARELTO 20 MG TABS tablet TAKE 1 TABLET(20 MG) BY MOUTH DAILY WITH SUPPER 30 tablet 5   fluticasone furoate-vilanterol (BREO ELLIPTA) 100-25 MCG/ACT AEPB Inhale 1 puff into the lungs daily.     No facility-administered medications prior to visit.     Review of Systems:   Constitutional: No weight loss or gain, night sweats, fevers, chills. +fatigue, lassitude (improved) HEENT: No headaches, difficulty swallowing, tooth/dental problems, or sore throat. No sneezing, itching, ear ache. +occasional nasal congestion, post nasal drip CV: No orthopnea, chest pain, PND, swelling in lower extremities, anasarca, dizziness, palpitations, syncope Resp: +shortness of breath with exertion (baseline); congested cough (improved). No wheezing. No hemoptysis. No chest wall deformity GI:  No heartburn, indigestion, abdominal pain, nausea, vomiting, diarrhea, change in bowel habits, bloody stools.  GU: No dysuria, change in color of urine, urgency or frequency.   Skin: No rash, lesions, ulcerations MSK:  No joint pain or swelling.   Neuro: No dizziness or lightheadedness.  Psych: No depression or anxiety.  Mood stable.     Physical Exam:  BP 122/74   Pulse 81   Ht 5' 0.5" (1.537 m)   Wt 187 lb 3.2 oz (84.9 kg)   SpO2 96%   BMI 35.96 kg/m   GEN: Pleasant, interactive, chronically-ill appearing; obese; in no acute distress HEENT:  Normocephalic and atraumatic. PERRLA. Sclera white. Nasal turbinates pink, moist and patent bilaterally. No  rhinorrhea present. Oropharynx pink and moist, without exudate or edema. No lesions, ulcerations, or postnasal drip.  NECK:  Supple w/ fair ROM. No JVD present. Normal carotid impulses w/o bruits. Thyroid symmetrical with no goiter or nodules palpated. No lymphadenopathy.   CV: RRR, no m/r/g, no peripheral edema. Pulses intact, +2 bilaterally. No cyanosis, pallor or clubbing. PULMONARY: Unlabored, regular breathing. Clear bilaterally A&P w/o wheezes/rales/rhonchi. No accessory muscle use.  GI: BS present and normoactive. Soft, non-tender to palpation. No organomegaly or masses detected.  MSK: No erythema, warmth or tenderness. Cap refil <2 sec all extrem. No deformities or joint swelling noted.  Neuro: A/Ox3. No focal deficits noted.   Skin: Warm, no lesions or rashe Psych: Normal affect and behavior. Judgement and thought content appropriate.     Lab Results:  CBC    Component Value Date/Time   WBC 10.1 05/22/2022 0901   RBC 4.95 05/22/2022 0901   HGB 13.6 05/22/2022 0901   HGB 13.4 12/28/2012 0748   HCT 41.4 05/22/2022 0901   HCT 39.2 12/28/2012 0748   PLT 234.0 05/22/2022 0901   PLT 186 12/28/2012 0748   MCV 83.5 05/22/2022 0901   MCV 87 12/28/2012 0748   MCH 27.1 05/01/2022 0229   MCHC 32.9 05/22/2022 0901   RDW 17.3 (H) 05/22/2022 0901   RDW 15.0 (H) 12/28/2012 0748   LYMPHSABS 1.6 05/22/2022 0901   LYMPHSABS 0.4 (L) 12/28/2012 0748   MONOABS 0.7 05/22/2022 0901   MONOABS 0.5 12/28/2012 0748   EOSABS 0.1 05/22/2022 0901   EOSABS 0.0 12/28/2012 0748   BASOSABS 0.1 05/22/2022 0901   BASOSABS 0.0 12/28/2012 0748    BMET    Component Value Date/Time   NA 140 05/19/2022 1211   NA 130 (L) 12/28/2012 0748   K 4.5 05/19/2022 1211   K 4.6 12/28/2012 0748   CL 103 05/19/2022 1211   CL 95 (L) 12/28/2012 0748   CO2 28 05/19/2022 1211   CO2 23 12/28/2012 0748   GLUCOSE 161 (H) 05/19/2022 1211   GLUCOSE 391 (H) 12/28/2012 0748   BUN 16 05/19/2022 1211   BUN 32 (H)  12/28/2012 0748   CREATININE 0.72 05/19/2022 1211   CREATININE 1.29 12/28/2012 0748   CALCIUM 9.8 05/19/2022 1211   CALCIUM 14.3 (HH) 06/26/2019 1618   GFRNONAA >60 05/03/2022 0303   GFRNONAA 43 (L) 12/28/2012 0748   GFRAA 58 (L) 06/28/2019 0427   GFRAA 50 (L) 12/28/2012 0748    BNP    Component Value Date/Time   BNP 58.7 04/28/2022 1200     Imaging:  No results found.  lidocaine (XYLOCAINE) 1 % (with pres) injection 3 mL     Date Action Dose Route User   07/23/2022 1215 Given 3 mL Other (Left Shoulder) [name], MD      triamcinolone acetonide [facility]) injection 40 mg     Date Action Dose Route User   07/23/2022 1215 Given 40 mg Intra-articular (Left Shoulder) [name], MD           No data to display  No results found for: "NITRICOXIDE"      Assessment & Plan:   COPD with asthma Severe COPD with asthmatic component. She had recurrent exacerbations following RSV infection. She is clinically improved and has been stable since tapering off steroids at the end of January. Compensated on current regimen. Action plan in place.  Patient Instructions  Continue Breo 1 puff daily. Brush tongue and rinse mouth afterwards Continue Yupelri 1 vial once daily  Continue Albuterol inhaler 2 puffs or 3 mL neb every 6 hours as needed for shortness of breath or wheezing. Notify if symptoms persist despite rescue inhaler/neb use. Use neb twice daily  Continue astelin 2 sprays each nostril Twice daily  Continue saline nasal irrigations 1-2 times a day  Continue benzonatate 1 capsule Three times a day for cough. Use on a scheduled basis for the next week Continue Tussionex 5 mL every 12 hours for cough Continue supplemental oxygen 4-5 lpm to maintain saturations >88-90% Continue Guaifenesin 600 mg Twice daily for chest congestion Continue Flutter valve 2-3 times a day following breathing treatment   Use your sinus rinse followed by flonase  and astelin nasal sprays about 15-30 minutes afterwards.    Start BiPAP once received from [facility] - we will follow up on this today and determine why they provided you with another ventilator and not a BiPAP   Follow up in 12 weeks to see how BiPAP is going with Dr. Elsworth Soho (1st) or Katie Natesha Hassey,NP. If symptoms do not improve or worsen, please contact office for sooner follow up or seek emergency care   Chronic respiratory failure with hypoxia ([place]) Stable without increased O2 requirement. Goal >88-90%  Sleep apnea At some point, [facility] transitioned her to NIV from BiPAP. Unclear what prompted the change. She tells me the machine broke and they brought the NIV out to replace it. She has tried to be consistent with using it; however, the alarms disrupt her sleep and she doesn't feel like it provides her with much benefit given this. She does not have any hypercarbia. Orders were placed by Dr. Elsworth Soho 1/11 to d/c NIV and restart BiPAP therapy at 12/8 cmH2O, which maintained her during her BiPAP titration in 2022.  For some reason, the DME company declined to provide her with the prescribed therapy and instead, gave her a new NIV machine. We are attempting to reach them to determine why this occurred and get her started on the correct therapy.   I spent 40 minutes of dedicated to the care of this patient on the date of this encounter to include pre-visit review of records, face-to-face time with the patient discussing conditions above, post visit ordering of testing, clinical documentation with the electronic health record, making appropriate referrals as documented, and communicating necessary findings to members of the patients care team.  Clayton Bibles, NP 07/30/2022  Pt aware and understands NP's role.

## 2022-07-30 NOTE — Patient Instructions (Addendum)
Continue Breo 1 puff daily. Brush tongue and rinse mouth afterwards Continue Yupelri 1 vial once daily  Continue Albuterol inhaler 2 puffs or 3 mL neb every 6 hours as needed for shortness of breath or wheezing. Notify if symptoms persist despite rescue inhaler/neb use. Use neb twice daily  Continue astelin 2 sprays each nostril Twice daily  Continue saline nasal irrigations 1-2 times a day  Continue benzonatate 1 capsule Three times a day for cough. Use on a scheduled basis for the next week Continue Tussionex 5 mL every 12 hours for cough Continue supplemental oxygen 4-5 lpm to maintain saturations >88-90% Continue Guaifenesin 600 mg Twice daily for chest congestion Continue Flutter valve 2-3 times a day following breathing treatment   Use your sinus rinse followed by flonase and astelin nasal sprays about 15-30 minutes afterwards.    Start BiPAP once received from [facility] - we will follow up on this today and determine why they provided you with another ventilator and not a BiPAP   Follow up in 12 weeks to see how BiPAP is going with Dr. Elsworth Soho (1st) or Katie Thang Flett,NP. If symptoms do not improve or worsen, please contact office for sooner follow up or seek emergency care

## 2022-07-30 NOTE — Assessment & Plan Note (Signed)
Severe COPD with asthmatic component. She had recurrent exacerbations following RSV infection. She is clinically improved and has been stable since tapering off steroids at the end of January. Compensated on current regimen. Action plan in place.  Patient Instructions  Continue Breo 1 puff daily. Brush tongue and rinse mouth afterwards Continue Yupelri 1 vial once daily  Continue Albuterol inhaler 2 puffs or 3 mL neb every 6 hours as needed for shortness of breath or wheezing. Notify if symptoms persist despite rescue inhaler/neb use. Use neb twice daily  Continue astelin 2 sprays each nostril Twice daily  Continue saline nasal irrigations 1-2 times a day  Continue benzonatate 1 capsule Three times a day for cough. Use on a scheduled basis for the next week Continue Tussionex 5 mL every 12 hours for cough Continue supplemental oxygen 4-5 lpm to maintain saturations >88-90% Continue Guaifenesin 600 mg Twice daily for chest congestion Continue Flutter valve 2-3 times a day following breathing treatment   Use your sinus rinse followed by flonase and astelin nasal sprays about 15-30 minutes afterwards.    Start BiPAP once received from [facility] - we will follow up on this today and determine why they provided you with another ventilator and not a BiPAP   Follow up in 12 weeks to see how BiPAP is going with Dr. Elsworth Soho (1st) or Katie Parlee Amescua,NP. If symptoms do not improve or worsen, please contact office for sooner follow up or seek emergency care

## 2022-07-30 NOTE — Assessment & Plan Note (Signed)
Stable without increased O2 requirement. Goal >88-90% 

## 2022-07-31 ENCOUNTER — Telehealth: Payer: Self-pay | Admitting: Nurse Practitioner

## 2022-07-31 DIAGNOSIS — J9611 Chronic respiratory failure with hypoxia: Secondary | ICD-10-CM

## 2022-07-31 NOTE — Telephone Encounter (Signed)
Called and spoke with Ashly from [facility]. He was calling for clarification on the bipap order and D/Cing her NIV. He stated that it would be easier to get her qualified with the COPD diagnosis. In order to do this, she would need to have a ONO on oxygen, ABG and bipap titration. I advised him that she has already completed a ABG and bipap titration. He was unable to see these documents so I offered to fax them to him. I will fax these to [phone].  He is aware that I will ask Joellen Jersey if she is willing to order the ONO on oxygen. He stated that once this has been completed, she will be able to get the bipap machine.  Katie and Dr. Elsworth Soho, please advise if you all are ok with ordering the ONO. Thanks!

## 2022-08-03 ENCOUNTER — Ambulatory Visit
Admission: RE | Admit: 2022-08-03 | Discharge: 2022-08-03 | Disposition: A | Discharge status: Home / Self Care | Payer: Medicare Other | Source: Ambulatory Visit | Attending: Cardiology | Admitting: Cardiology

## 2022-08-03 ENCOUNTER — Ambulatory Visit (INDEPENDENT_AMBULATORY_CARE_PROVIDER_SITE_OTHER): Payer: Medicare Other

## 2022-08-03 DIAGNOSIS — I5022 Chronic systolic (congestive) heart failure: Secondary | ICD-10-CM | POA: Insufficient documentation

## 2022-08-03 DIAGNOSIS — Z950 Presence of cardiac pacemaker: Secondary | ICD-10-CM | POA: Diagnosis not present

## 2022-08-03 LAB — BASIC METABOLIC PANEL
Anion gap: 12 (ref 5–15)
BUN: 22 mg/dL (ref 8–23)
CO2: 28 mmol/L (ref 22–32)
Calcium: 9.5 mg/dL (ref 8.9–10.3)
Chloride: 98 mmol/L (ref 98–111)
Creatinine, Ser: 0.8 mg/dL (ref 0.44–1.00)
GFR, Estimated: >60 mL/min (ref >60)
Glucose, Bld: 100 mg/dL — ABNORMAL HIGH (ref 70–99)
Potassium: 3.8 mmol/L (ref 3.5–5.1)
Sodium: 138 mmol/L (ref 135–145)

## 2022-08-03 NOTE — Telephone Encounter (Signed)
New ONO order placed. Put in comments to do on bipap or room air per Dr Angus Palms instructions. Called and updated [facility]. Nothing further needed

## 2022-08-03 NOTE — Telephone Encounter (Signed)
[facility] is aware that the patient has had an ABG and bipap titration. They are still requesting an ONO on oxygen. Please send response back to triage as I am not in triage this week.

## 2022-08-05 ENCOUNTER — Telehealth: Payer: Self-pay | Admitting: Pulmonary Disease

## 2022-08-05 ENCOUNTER — Telehealth: Payer: Self-pay

## 2022-08-05 DIAGNOSIS — J9611 Chronic respiratory failure with hypoxia: Secondary | ICD-10-CM

## 2022-08-05 DIAGNOSIS — G4730 Sleep apnea, unspecified: Secondary | ICD-10-CM

## 2022-08-05 DIAGNOSIS — J4489 Other specified chronic obstructive pulmonary disease: Secondary | ICD-10-CM

## 2022-08-05 NOTE — Telephone Encounter (Signed)
New ONO order placed specifying 4L O2. Routing back to [employer].

## 2022-08-05 NOTE — Telephone Encounter (Signed)
Remote ICM transmission received.  Attempted call to patient regarding ICM remote transmission and left detailed message per DPR.  Advised to return call for any fluid symptoms or questions. Next ICM remote transmission scheduled 09/07/2022.

## 2022-08-05 NOTE — Telephone Encounter (Signed)
Called [facility] and spoke with a representative, she states there is a ticket for her POC.  Because of the high liter flow (4-5 liters), the battery does not last as long, she will have to make sure she has the changing cords with her.  I also inquired about the Bipap and was told it was still showing incomplete.  A message will be sent to get an update on the status of the Bipap.  ATC patient x1.  LVM to return call.

## 2022-08-05 NOTE — Progress Notes (Signed)
Returned call to patient as requested by voice mail message due to needed clarification on Torsemide and Potassium dosage.  Advised the prescription of Torsemide and Potassium were changed on 2/14 to tablet each once a day.  Advised I had provided those instructions for that change and sent updated prescriptions.  Encouraged to write down any med changes with dates, instructions and who she spoke with so can refer back to any changes.  Advised to follow the latest prescription and reviewed instructions.  She verbalized understanding.

## 2022-08-05 NOTE — Telephone Encounter (Signed)
Pt calling because her O2 is not working properly and [facility] is not providing good service to get the issue resolved. She was left with one battery and it is not charging properly and can not leave the house for another medical appt because the O2 won't last.   She is supposed to be getting a Bi-Pap machine from them as well but has lost trust in the company and seeks another supplier. Pls call to advise @ [phone]

## 2022-08-05 NOTE — Telephone Encounter (Signed)
[facility] message - ONO order   Coltrane, Kathleen [name], [name]; Adams, Melissa L We did receive the ONO order, however the order needs to be written for ONO on 4lpm. The patient can not do room air and she does not have a Bipap at this time. ONO on her current liter flow is how she will qualify for Bipap.

## 2022-08-05 NOTE — Progress Notes (Signed)
EPIC Encounter for ICM Monitoring  Patient Name: Kathleen [name] is a 77 y.o. female Date: 08/05/2022 Primary Care Physican: [name], MD Primary Cardiologist: [place] Electrophysiologist: [place] Bi-V Pacing: 100%          05/19/2023 Weight: 184 lbs  06/30/2022 Weight: 181 lbs 07/03/2022 Weight: 188 lbs 07/15/2022 Weight: 187.4 lbs 07/22/2022 Weight: 191 lbs 07/28/2022 Weight: 184.6 lbs                                                     Attempted call to patient and unable to reach.  Left detailed message per DPR regarding transmission. Transmission reviewed.    Optivol thoracic impedance suggesting normal fluid levels.   Fluid index returned to normal.   Prescribed:  Torsemide 20 mg take 1 tablet(s) (20 mg total) by mouth daily (changed to daily 2/14).   Potassium 10 mEq take 1 tablet (10 mEq total) by mouth daily (started 2/14) Spironolactone 25 mg take 1 tablet daily   Labs: 08/03/2022 Creatinine 0.80, BUN 22, Potassium 3.8, Sodium 138, GFR >60 05/19/2022 Creatinine 0.72, BUN 16, Potassium 4.5, Sodium 140  05/03/2022 Creatinine 0.66, BUN 22, Potassium 4.4, Sodium 137, GFR >60  05/01/2022 Creatinine 1.00, BUN 25, Potassium 4.8, Sodium 139, GFR 59  04/30/2022 Creatinine 0.76, BUN 22, Potassium 4.7, Sodium 137, GFR >60 04/29/2022 Creatinine 0.88, BUN 19, Potassium 4.3, Sodium 138, GFR >60  04/28/2022 Creatinine 1.01, BUN 26, Potassium 4.0, Sodium 139, GFR 58  04/21/2022 Creatinine 0.86, BUN 15, Potassium 4.1, Sodium 135  A complete set of results can be found in Results Review.   Recommendations:  Left voice mail with ICM number and encouraged to call if experiencing any fluid symptoms.   Follow-up plan: ICM clinic phone appointment on 09/07/2022.  91 day device clinic remote transmission 09/30/2022.     EP/Cardiology Office Visits:  09/15/2022 with [facility].     Copy of ICM check sent to Dr. Curt Bears.  3 month ICM trend: 08/02/2022.    12-14 Month ICM trend:      Rosalene Billings, RN 08/05/2022 8:15 AM

## 2022-08-07 ENCOUNTER — Telehealth (HOSPITAL_COMMUNITY): Payer: Self-pay | Admitting: Cardiology

## 2022-08-07 LAB — HM DIABETES EYE EXAM

## 2022-08-07 NOTE — Telephone Encounter (Signed)
Pt called to confirm dose of diuretics Reports she was instructed to increase for 10 days and unsure is she should continue MWF or daily dose of 20 mg of torsemide   Per ICD treatment team  the prescription of Torsemide and Potassium were changed on 2/14 to tablet each once a day  Pt aware and voiced understanding

## 2022-08-13 ENCOUNTER — Ambulatory Visit (INDEPENDENT_AMBULATORY_CARE_PROVIDER_SITE_OTHER): Payer: Medicare Other | Admitting: Sports Medicine

## 2022-08-13 ENCOUNTER — Other Ambulatory Visit: Payer: Self-pay | Admitting: Internal Medicine

## 2022-08-13 ENCOUNTER — Ambulatory Visit (INDEPENDENT_AMBULATORY_CARE_PROVIDER_SITE_OTHER): Payer: Medicare Other

## 2022-08-13 ENCOUNTER — Encounter: Payer: Self-pay | Admitting: Sports Medicine

## 2022-08-13 DIAGNOSIS — M25511 Pain in right shoulder: Secondary | ICD-10-CM

## 2022-08-13 DIAGNOSIS — G8929 Other chronic pain: Secondary | ICD-10-CM

## 2022-08-13 DIAGNOSIS — M546 Pain in thoracic spine: Secondary | ICD-10-CM

## 2022-08-13 DIAGNOSIS — M47814 Spondylosis without myelopathy or radiculopathy, thoracic region: Secondary | ICD-10-CM

## 2022-08-13 DIAGNOSIS — M75121 Complete rotator cuff tear or rupture of right shoulder, not specified as traumatic: Secondary | ICD-10-CM | POA: Diagnosis not present

## 2022-08-13 DIAGNOSIS — E785 Hyperlipidemia, unspecified: Secondary | ICD-10-CM

## 2022-08-13 DIAGNOSIS — M19011 Primary osteoarthritis, right shoulder: Secondary | ICD-10-CM

## 2022-08-13 MED ORDER — BUPIVACAINE HCL 0.25 % IJ SOLN
2.0000 mL | INTRAMUSCULAR | Status: AC | PRN
  Administered 2022-08-13: 2 mL via INTRA_ARTICULAR

## 2022-08-13 MED ORDER — METHYLPREDNISOLONE ACETATE 40 MG/ML IJ SUSP
40.0000 mg | INTRAMUSCULAR | Status: AC | PRN
  Administered 2022-08-13: 40 mg via INTRA_ARTICULAR

## 2022-08-13 MED ORDER — LIDOCAINE HCL 1 % IJ SOLN
2.0000 mL | INTRAMUSCULAR | Status: AC | PRN
  Administered 2022-08-13: 2 mL

## 2022-08-13 NOTE — Progress Notes (Signed)
Kathleen BOULTON - 77 y.o. female MRN [id number]  Date of birth: [birth year]  Office Visit Note: Visit Date: 08/13/2022 PCP: [name], MD Referred by: Clayton Bibles, NP  Subjective: Chief Complaint  Patient presents with   Right Shoulder - Pain   Middle Back - Pain   HPI: Kathleen [name] is a pleasant 77 y.o. female who presents today for acute on chronic right shoulder pain; mid back pain and trapezius pain.  Kathleen [name] has had many years of right shoulder pain.  She did have rotator cuff surgery 20+ years ago.  Prior to her moved to [facility] she saw a physician about 4-5 years ago who told her she had full tearing of her rotator cuff tendons.   Mid and upper back pain -this has bothered her for a few years, although was exacerbated on September 06, 2021 where she had a bad fall that included concussion and a brain bleed that was treated conservatively.  This was her only real fall.  She did see Dr. Junius Roads back in 2022 and did do trigger point injections.  She reports she is not a surgical candidate given her degree of congestive heart failure as well as COPD, she is chronically on nasal cannula 3-4 L.  Is taken Flexeril 5 mg as well as Tylenol for pain control.  Last A1c was 5.8 on 04/30/2022.  Pertinent ROS were reviewed with the patient and found to be negative unless otherwise specified above in HPI.   Assessment & Plan: Visit Diagnoses:  1. Chronic right shoulder pain   2. Pain in thoracic spine   3. Primary osteoarthritis, right shoulder   4. Complete tear of right rotator cuff, unspecified whether traumatic   5. Thoracic spondylosis    Plan: Discussed with Kathleen [name] the nature of her acute on chronic right shoulder pain which is indicative of both osteoarthritis as well as likely full-thickness tearing of the rotator cuff.  The only permanent solution to improve her pain and function would be a total reverse shoulder arthroplasty, however she is not a surgical candidate.   Through shared decision-making, did proceed with subacromial joint injection, patient tolerated well.  She will continue Tylenol and ice as needed for pain control.  In terms of her thoracic spine, she does have an exaggerated thoracic kyphosis.  She has reciprocal muscle hypertonicity in the trapezius and paraspinal musculature.  I would like to get her into formalized physical therapy to work on posture improvement, likely some dry needling, possible TENS unit and other treatment modalities as they see fit.  Hopefully they can give her some range of motion exercises for the shoulder as well.  I would like to see her back in about 6 weeks to see what sort of progress she is making.    We also may consider trigger point injections in the future  Follow-up: Return in about 6 weeks (around 09/24/2022) for for right shoulder, back.   Meds & Orders: No orders of the defined types were placed in this encounter.   Orders Placed This Encounter  Procedures   XR Shoulder Right   XR Thoracic Spine 2 View   Ambulatory referral to Physical Therapy     Procedures: Large Joint Inj: R subacromial bursa on 08/13/2022 7:47 PM Indications: pain Details: 22 G 1.5 in needle, posterior approach Medications: 2 mL lidocaine 1 %; 2 mL bupivacaine 0.25 %; 40 mg methylPREDNISolone acetate 40 MG/ML Outcome: tolerated well, no immediate complications  Subacromial Joint Injection,  Right Shoulder After discussion on risks/benefits/indications, informed verbal consent was obtained. A timeout was then performed. Patient was seated on table in exam room. The patient's shoulder was prepped with betadine and alcohol swabs and utilizing posterior approach a 22G, 1.5" needle was directed anteriorly and laterally into the patient's subacromial space was injected with 2:2:1 mixture of lidocaine:bupivicaine:depomedrol with appreciation of free-flowing of the injectate into the bursal space. Patient tolerated the procedure well without  immediate complications.   Procedure, treatment alternatives, risks and benefits explained, specific risks discussed. Consent was given by the patient. Immediately prior to procedure a time out was called to verify the correct patient, procedure, equipment, support staff and site/side marked as required. Patient was prepped and draped in the usual sterile fashion.          Clinical History: No specialty comments available.  She reports that she quit smoking about 35 years ago. Her smoking use included cigarettes. She has a 20.00 pack-year smoking history. She has been exposed to tobacco smoke. She has never used smokeless tobacco.  Recent Labs    01/19/22 1110 04/30/22 0158  HGBA1C  --  5.8*  LABURIC 5.4  --     Objective:    Physical Exam  Gen: Well-appearing, in no acute distress; non-toxic CV:  Well-perfused. Warm.  Resp: There is mild conversational dyspnea; patient with nasal cannula oxygen in place.  No wheezing. Psych: Fluid speech in conversation; appropriate affect; normal thought process Neuro: Sensation intact throughout. No gross coordination deficits.   Ortho Exam - Thoracic: No specific midline spinous process TTP.  There is exaggerated thoracic kyphosis.  There is hypertonicity of bilateral trapezius muscles, right greater than left.  Bilateral for protraction.  Equivocal flexion and extension of the thoracic spine.  - Right shoulder: There is no redness or swelling of the right shoulder.  There is some generalized TTP throughout the shoulder and surrounding musculature.  There is marked restriction in both active and passive range of motion.  Forward flexion 50 degrees, able to be taken to 90 degrees passively.  Abduction 85 degrees to 95 degrees passively.  External rotation is only about 35 degrees.  There is crepitus noted throughout range of motion in all directions  Imaging: XR Shoulder Right  Result Date: 08/13/2022 3 views of the right shoulder including AP,  scapular Y and axial views were ordered and reviewed by myself.  X-rays demonstrate significantly high riding humeral head, suggestive of blowout rotator cuff tear.  There is severe advanced osteoarthritis of the glenohumeral joint and AC joint.  No acute fracture noted.  XR Thoracic Spine 2 View  Result Date: 08/13/2022 2 views of the thoracic spine including AP and lateral femoral ordered and reviewed by myself.  No significant scoliotic change, there is increased thoracic kyphosis.  There are degenerative changes noted of the mid to lower thoracic spine.  Possibly very subtle vascular IV disc height at T10.  Anterior spurring noted of the lower thoracic spine.   Evaluation of CT scan of the cervical spine from 10/04/2021 shows spondylosis, mild anterior listhesis and facet degeneration.  No recent fracture is seen in the cervical spine. Minimal anterolisthesis at C4-C5 level may suggest previous ligament injury and facet degeneration.  Narrative & Impression  CLINICAL DATA:  Fall.  Lumbar radiculopathy, trauma   EXAM: MRI LUMBAR SPINE WITHOUT CONTRAST   TECHNIQUE: Multiplanar, multisequence MR imaging of the lumbar spine was performed. No intravenous contrast was administered.   COMPARISON:  CT pelvis 10/06/2021.  Lumbar spine x-ray 10/05/2021, MRI 09/11/2009   FINDINGS: Segmentation:  Standard.   Alignment:  5 mm grade 1 anterolisthesis L4 on L5.   Vertebrae:  No fracture, evidence of discitis, or bone lesion.   Conus medullaris and cauda equina: Conus extends to the L1 level. Conus and cauda equina appear normal.   Paraspinal and other soft tissues: Similar findings of acute sigmoid diverticulitis with ill-defined free fluid within the pelvis, as seen on recent pelvis CT.   Disc levels:   T12-L1: No significant disc protrusion, foraminal stenosis, or canal stenosis.   L1-L2: No significant disc protrusion, foraminal stenosis, or canal stenosis.   L2-L3: Minimal annular  disc bulge. Mild bilateral facet hypertrophy. No foraminal or canal stenosis.   L3-L4: Mild annular disc bulge. Mild bilateral facet hypertrophy. No foraminal or canal stenosis.   L4-L5: Anterolisthesis with disc uncovering and diffuse disc bulge. Moderate-severe bilateral facet arthropathy with trace bilateral facet joint effusions. Buckling of the ligamentum flavum. Findings contribute to severe canal stenosis with mild bilateral foraminal stenosis.   L5-S1: Central disc protrusion with moderate bilateral facet arthropathy. Moderate bilateral subarticular recess stenosis without canal stenosis. Mild right foraminal stenosis.   IMPRESSION: 1. Multilevel lumbar spondylosis, as described above. 2. At L4-L5, there is severe canal stenosis and mild bilateral foraminal stenosis. 3. At L5-S1, there is moderate bilateral subarticular recess stenosis and mild right foraminal stenosis. 4. Similar findings of acute sigmoid diverticulitis with ill-defined free fluid within the pelvis, as seen on recent pelvis CT.     Electronically Signed   By: Davina Poke D.O.   On: 10/07/2021 14:39   Past Medical/Family/Surgical/Social History: Medications & Allergies reviewed per EMR, new medications updated. 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   Depression, major, single episode, moderate ([place]) 04/27/2022   Non-STEMI (non-ST elevated myocardial infarction) ([place]) 03/18/2022   Hypotension 03/18/2022   Morbid obesity ([place]) 03/18/2022   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   Low TSH level 12/02/2021   Acute idiopathic gout of left hand 11/19/2021   Acquired  hypothyroidism 11/12/2021   Encounter for palliative care involving management of pain 11/12/2021   Post concussive syndrome 11/11/2021   Chronic left-sided low back pain 11/04/2021   Gout 10/01/2021   Hyperlipidemia with target LDL less than 100 08/07/2021   Chronic anticoagulation 07/07/2021   Stage 3a chronic kidney disease ([place]) 05/23/2021   Dietary iron deficiency 05/23/2021   Age-related osteoporosis without current pathological fracture 04/22/2021   Type 2 diabetes mellitus with diabetic neuropathy, without long-term current use of insulin ([place]) 04/22/2021   Sleep apnea 01/22/2021   Statin intolerance 10/12/2019   Statin myopathy [id number]   Chronic systolic heart failure ([place]) 04/06/2018   HTN (hypertension) 04/06/2018   Atrial fibrillation ([place]) 04/06/2018   Chronic respiratory failure with hypoxia ([place]) 03/28/2018   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 12/02/2015   Malnutrition of moderate degree 09/11/2015   Past Medical History:  Diagnosis Date   Asthma    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[place])    CHF (congestive heart failure) ([place])    Chronic renal insufficiency    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  Concussion 10/04/2021   COPD (chronic obstructive pulmonary disease) ([facility])    Gout    Hypertension    Longstanding persistent atrial fibrillation ([facility])    on Xarelto   Nonischemic cardiomyopathy ([facility])    Obesity    Pacemaker    Spontaneous pneumothorax 2013   Family History  Problem Relation Age of Onset   Breast cancer Mother 49       3 different times   Pancreatic cancer Mother    Emphysema Father    Healthy Brother    Breast cancer Cousin    Valvular heart disease Son    Obesity Daughter    Colon cancer Neg Hx    Esophageal cancer Neg Hx    Stomach cancer Neg Hx    Inflammatory bowel disease Neg Hx    Liver disease Neg Hx    Rectal cancer Neg Hx    Past Surgical History:  Procedure  Laterality Date   ABDOMINAL HYSTERECTOMY     APPENDECTOMY     ATRIAL FIBRILLATION ABLATION  07/20/2013   by Dr Westley Gambles   AV nodal ablation  11/01/2013   by Dr Westley Gambles, repeated by Dr Dwana Curd   BREAST BIOPSY Bilateral 1997   negative   CARDIAC CATHETERIZATION     CHOLECYSTECTOMY     HERNIA REPAIR     PACEMAKER INSERTION  06/2017   MDT Viva CRT-P implanted by Dr Westley Gambles after AV nodal ablation,  LV lead could not be placed   PACEMAKER INSERTION  05/2020   with lead bundle   RIGHT/LEFT HEART CATH AND CORONARY ANGIOGRAPHY N/A 03/20/2022   Procedure: RIGHT/LEFT HEART CATH AND CORONARY ANGIOGRAPHY;  Surgeon: Martinique, Peter M, MD;  Location: [facility];  Service: Cardiovascular;  Laterality: N/A;   Social History   Occupational History   Occupation: retired  Tobacco Use   Smoking status: Former    Packs/day: 1.00    Years: 20.00    Total pack years: 20.00    Types: Cigarettes    Quit date: 06/09/1987    Years since quitting: 35.2    Passive exposure: Past   Smokeless tobacco: Never   Tobacco comments:    Former smoker 12/25/21  Vaping Use   Vaping Use: Never used  Substance and Sexual Activity   Alcohol use: No    Alcohol/week: 0.0 standard drinks of alcohol   Drug use: No   Sexual activity: Not Currently

## 2022-08-13 NOTE — Progress Notes (Signed)
History of RC surgery 20+ years  Possible re-tear; but surgery is not indicated Limited ROM due to pain Has not had injection in this shoulder  Complaints of mid back pain Fall back in April that left her in hospital for a while Does not remember any xrays of the shoulder or mid back  Takes tramadol at night for pain And occasional flexeril

## 2022-08-20 NOTE — Telephone Encounter (Signed)
Rerouting to Triage for 2nd attempt to call. Thank you.

## 2022-08-20 NOTE — Telephone Encounter (Signed)
See encounter from 07/31/22.

## 2022-08-26 ENCOUNTER — Non-Acute Institutional Stay: Payer: Medicare Other | Admitting: Family Medicine

## 2022-08-26 ENCOUNTER — Telehealth: Payer: Self-pay | Admitting: Pulmonary Disease

## 2022-08-26 VITALS — HR 77 | Temp 98.0°F | Resp 20

## 2022-08-26 DIAGNOSIS — I5022 Chronic systolic (congestive) heart failure: Secondary | ICD-10-CM

## 2022-08-26 DIAGNOSIS — J9611 Chronic respiratory failure with hypoxia: Secondary | ICD-10-CM

## 2022-08-26 NOTE — Telephone Encounter (Signed)
ONO on 4L [place] showed minimal desat about 1 min This should qualify her for biPAP Please check with [facility] that they have what they need

## 2022-08-26 NOTE — Telephone Encounter (Signed)
Attempted to call pt but unable to reach. Left message to return call.   While waiting for pt to return call, sending this to [place], [facility] so she can see if [facility] has all they need now for pt to be able to get a BIPAP machine.

## 2022-08-26 NOTE — Progress Notes (Signed)
[facility] Pall[facility] Consult Note Telephone: [phone]  Fax: [phone]    Date of encounter: 08/26/22 1:38 PM PATIENT NAME: Kathleen [name] [address] [place] [address]   [phone] (home)  DOB: 1946/01/17 MRN: [id number] PRIMARY CARE PROVIDER:    [name], MD,  [place] [address] [phone]  REFERRING PROVIDER:   [name], MD [address] [place],  [place] [address] [phone]  RESPONSIBLE PARTY:    Contact Information     Name Relation Home Work [place] Son [phone]  [phone]   Artha, Zemba [phone]  [phone]   Alveria Apley Daughter   [phone]        I met face to face with patient in [facility] facility. Palliative Care was asked to follow this patient by consultation request of  [name], MD to address advance care planning and complex medical decision making. This is a follow up visit   ASSESSMENT , SYMPTOM MANAGEMENT AND PLAN / RECOMMENDATIONS:  Heart failure with reduced EF Stable, appears euvolemic presently.  Continue ARB, Spironolactone, Torsemide with potassium supplement and Nexletol to retain rate control per existing prescribed directions.  Chronic Respiratory Failure with Hypoxia/Unspecified COPD Stable on current plan. Continue Mucinex, Breo-Ellipta 100-25 mcg daily, Yupelri neb, Astelin nasal spray. Continues on O2@4   L [place].   CODE STATUS: Full cod   Follow up Palliative Care Visit: Palliative care will continue to follow for complex medical decision making, advance care planning, and clarification of goals. Return 4 weeks or prn.   This visit was coded based on medical decision making (MDM).  PPS: 60%  HOSPICE ELIGIBILITY/DIAGNOSIS: TBD  Chief Complaint:  Palliative Care continues to follow pt with chronic respiratory failure with hypoxia due to heart failure and COPD.  HISTORY  OF PRESENT ILLNESS:  Kathleen [name] is a 77 y.o. year old female with hx of traumatic head injury from fall in April 2023 resulting in subarachnoid hemorrhage, concussion and post concussive syndrome. 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  In interview states that TSH has been slowly increasing 01/2022 2.910 , 08/18/22 at 4.090 and has pending follow up appt to discuss restart of levothyroxine. Reports continued dizziness intermittently generally with bed mobility and at times from sitting to standing but overall has improved substantially. Has recently started working with PT and reports increased pain in R Shoulder due to increased exercise. Continues with current Mucinex regimen. States "I feel the best I have since I moved here." Denies worsening SOB, PND.  Denies nausea or falls but has been noting weight gain and attributing it to not being on Levothyroxine.   History obtained from review of EMR, discussion with [name].     Latest Ref Rng & Units 05/22/2022    9:01 AM 05/19/2022   12:11 PM 05/01/2022    2:29 AM  CBC  WBC 4.0 - 10.5 K/uL 10.1  11.1  13.5   Hemoglobin 12.0 - 15.0 g/dL 13.6  13.3  11.7   Hematocrit 36.0 - 46.0 % 41.4  40.0  37.0   Platelets 150.0 - 400.0 K/uL 234.0  209.0  241        Latest Ref Rng & Units 08/03/2022  11:03 AM 05/19/2022   12:11 PM 05/03/2022    3:03 AM  CMP  Glucose 70 - 99 mg/dL 100  161  118   BUN 8 - 23 mg/dL 22  16  22    Creatinine 0.44 - 1.00 mg/dL 0.80  0.72  0.66   Sodium 135 - 145 mmol/L 138  140  137   Potassium 3.5 - 5.1 mmol/L 3.8  4.5  4.4   Chloride 98 - 111 mmol/L 98  103  100   CO2 22 - 32 mmol/L 28  28  28    Calcium 8.9 - 10.3 mg/dL 9.5  9.8  10.4      I reviewed EMR for available labs,  medications, imaging, studies and related documents.   Physical Exam: Constitutional: NAD General: WN,WD ENMT: intact hearing CV: S1S2, RRR, Trace BLE edema Pulmonary:  CTAB, no increased work of breathing/rales/rhonchi, O2 at 4 L [place] Abdomen: normo-active BS + 4 quadrants, soft and non tender GU: deferred MSK: Decreased ROM R shoulder, no sarcopenia, ambulatory independently with steady gate, uses rollator outside of apartment and prn Skin: warm and dry, no rashes or wounds on visible skin Neuro:  no generalized weakness,  no cognitive impairment Psych: non-anxious affect, A and O x 3 Hem/lymph/immuno: no widespread bruising   Thank you for the opportunity to participate in the care of [name].  The palliative care team will continue to follow. Please call our office at [phone] if we can be of additional assistance.   Marijo Conception, FNP -C  COVID-19 PATIENT SCREENING TOOL Asked and negative response unless otherwise noted:   Have you had symptoms of covid, tested positive or been in contact with someone with symptoms/positive test in the past 5-10 days?  No

## 2022-08-27 NOTE — Telephone Encounter (Signed)
[place]ocks, Ashly  [place], [name]; [place], Terri; [place], Melissa L we may have everything let me check , an ABG may be averte if all sleep studies and titrations can be obtained.

## 2022-08-31 ENCOUNTER — Encounter: Payer: Self-pay | Admitting: Family Medicine

## 2022-09-07 ENCOUNTER — Ambulatory Visit: Payer: Medicare Other | Attending: Cardiology

## 2022-09-07 DIAGNOSIS — Z950 Presence of cardiac pacemaker: Secondary | ICD-10-CM | POA: Diagnosis not present

## 2022-09-07 DIAGNOSIS — I5022 Chronic systolic (congestive) heart failure: Secondary | ICD-10-CM

## 2022-09-07 NOTE — Progress Notes (Signed)
Spoke with patient and patient wanted to clarify earlier recommendation.  Advised again to take Potassium 2 tablets (20 mEq total) x 3 days only with Torsemide 2 tablets (40 mg total) x 3 days only.  She will resume Torsemide 1 tablet and Potassium 1 tablet on Friday, 4/5 as prescribed.  Pt previously wrote instructions but still unsure dosage.  She reports understanding how to take the recommended dosages for next 3 days.

## 2022-09-07 NOTE — Telephone Encounter (Signed)
Patient calling back for ONO results. Patient states phone was messed up but working now and advise her to look out for our number.    Also states [facility] brought patient new Biapap while they were checking her old one. Patient has not been able to use for over a month because it have a severe smell of smoke and barely blowing air. Advised patient to contact [facility] again to see if there is anything that can be done. Her insurance is now sates she needs some kind of wrist procedure to check for gasses before having new machine covered. Unsure of where to route so routing to Triage and [facility].  Please advise.

## 2022-09-07 NOTE — Progress Notes (Signed)
EPIC Encounter for ICM Monitoring  Patient Name: Kathleen [name] is a 77 y.o. female Date: 09/07/2022 Primary Care Physican: [name], MD Primary Cardiologist: Thayer Electrophysiologist: Camnitz Bi-V Pacing: 100%          05/19/2023 Weight: 184 lbs  06/30/2022 Weight: 181 lbs 07/03/2022 Weight: 188 lbs 07/15/2022 Weight: 187.4 lbs 07/22/2022 Weight: 191 lbs 07/28/2022 Weight: 184.6 lbs 09/07/2022 Weight: 187 lbs                                                     Spoke with patient and heart failure questions reviewed.  Transmission results reviewed.  Pt continues to have weight gain and feeling tired all the time.  Weight was 190 lbs earlier this week.  She confirmed she takes Torsemide 20 mg daily.    Diet:  Eats foods that are prepared at the facility she lives in which are not low salt.    Optivol thoracic impedance suggesting possible fluid accumulation starting 2/25.   Prescribed:  Torsemide 20 mg take 1 tablet(s) (20 mg total) by mouth daily (changed to daily 2/14).   Potassium 10 mEq take 1 tablet (10 mEq total) by mouth daily (started 2/14) Spironolactone 25 mg take 1 tablet daily   Labs: 08/18/2022 Creatinine 0.8,   BUN 24, Potassium 4.3, Sodium 142, GFR 77 08/03/2022 Creatinine 0.80, BUN 22, Potassium 3.8, Sodium 138, GFR >60 05/19/2022 Creatinine 0.72, BUN 16, Potassium 4.5, Sodium 140  A complete set of results can be found in Results Review.   Recommendations:  Advised to take Torsemide 2 tablets (40 mg total) x 3 days only along with Potassium 2 tablets daily x 3 days only.  Advised after 3rd day, return to prescription of Torsemide 1 tablet daily and Potassium 1 tablet daily.    Follow-up plan: ICM clinic phone appointment on 09/11/2022 to recheck fluid levels (Device will be checked at HF clinic on 4/9).  91 day device clinic remote transmission 09/30/2022.     EP/Cardiology Office Visits:  09/15/2022 with HF Clinic with echo.     Copy of ICM check sent to Dr.  Curt Bears and Dr Haroldine Laws as Juluis Rainier.    3 month ICM trend: 09/07/2022.    12-14 Month ICM trend:     Rosalene Billings, RN 09/07/2022 1:24 PM

## 2022-09-07 NOTE — Telephone Encounter (Signed)
Please advise 

## 2022-09-08 NOTE — Telephone Encounter (Signed)
[place], Noni Saupe, [name]; [place], [place]; [place], Terri; [place], [place]) will be  processing and submitting for delivery.

## 2022-09-08 NOTE — Telephone Encounter (Signed)
Pt called the office and I relayed her all the messages from Dr. Elsworth Soho and Raven. Pt verbalized understanding. Nothing further needed.

## 2022-09-11 ENCOUNTER — Ambulatory Visit: Payer: Medicare Other | Attending: Cardiology

## 2022-09-11 DIAGNOSIS — Z950 Presence of cardiac pacemaker: Secondary | ICD-10-CM

## 2022-09-11 DIAGNOSIS — I5022 Chronic systolic (congestive) heart failure: Secondary | ICD-10-CM

## 2022-09-11 NOTE — Progress Notes (Signed)
EPIC Encounter for ICM Monitoring  Patient Name: Kathleen [name] is a 77 y.o. female Date: 09/11/2022 Primary Care Physican: [name], MD Primary Cardiologist: Bensimhon Electrophysiologist: Camnitz Bi-V Pacing: 100%          05/19/2023 Weight: 184 lbs  06/30/2022 Weight: 181 lbs 07/03/2022 Weight: 188 lbs 07/15/2022 Weight: 187.4 lbs 07/22/2022 Weight: 191 lbs 07/28/2022 Weight: 184.6 lbs 09/07/2022 Weight: 187 lbs                                                     Spoke with patient and heart failure questions reviewed.  Transmission results reviewed.  Pt reports feeling better after taking extra Torsemide.    Diet:  She lives in an assisted living  Eats foods that are prepared at the facility she lives in which are not low salt.     Optivol thoracic impedance suggesting possible fluid accumulation starting 2/25.   Prescribed:  Torsemide 20 mg take 1 tablet(s) (20 mg total) by mouth daily (changed to daily 2/14).   Potassium 10 mEq take 1 tablet (10 mEq total) by mouth daily (started 2/14) Spironolactone 25 mg take 1 tablet daily   Labs: 08/18/2022 Creatinine 0.8,   BUN 24, Potassium 4.3, Sodium 142, GFR 77 08/03/2022 Creatinine 0.80, BUN 22, Potassium 3.8, Sodium 138, GFR >60 05/19/2022 Creatinine 0.72, BUN 16, Potassium 4.5, Sodium 140  A complete set of results can be found in Results Review.   Recommendations:  Advised to resume taking prescribed dosage of 1 Torsemide daily and 1 Potassium tablet daily and she verbalized understanding.    Follow-up plan: ICM clinic phone appointment on 10/12/2022.  91 day device clinic remote transmission 09/30/2022.     EP/Cardiology Office Visits:  09/15/2022 with HF Clinic with echo.     Copy of ICM check sent to Dr. Elberta Fortis.   3 month ICM trend: 09/11/2022.    12-14 Month ICM trend:     Karie Soda, RN 09/11/2022 10:38 AM

## 2022-09-15 ENCOUNTER — Ambulatory Visit (HOSPITAL_COMMUNITY)
Admission: RE | Admit: 2022-09-15 | Discharge: 2022-09-15 | Disposition: A | Discharge status: Home / Self Care | Payer: Medicare Other | Source: Ambulatory Visit | Attending: Internal Medicine | Admitting: Internal Medicine

## 2022-09-15 ENCOUNTER — Encounter (HOSPITAL_COMMUNITY): Payer: Self-pay

## 2022-09-15 ENCOUNTER — Ambulatory Visit (HOSPITAL_BASED_OUTPATIENT_CLINIC_OR_DEPARTMENT_OTHER)
Admission: RE | Admit: 2022-09-15 | Discharge: 2022-09-15 | Disposition: A | Discharge status: Home / Self Care | Payer: Medicare Other | Source: Ambulatory Visit | Attending: Internal Medicine | Admitting: Internal Medicine

## 2022-09-15 VITALS — BP 122/70 | HR 74 | Wt 190.2 lb

## 2022-09-15 DIAGNOSIS — I4811 Longstanding persistent atrial fibrillation: Secondary | ICD-10-CM | POA: Insufficient documentation

## 2022-09-15 DIAGNOSIS — R5383 Other fatigue: Secondary | ICD-10-CM

## 2022-09-15 DIAGNOSIS — I2510 Atherosclerotic heart disease of native coronary artery without angina pectoris: Secondary | ICD-10-CM | POA: Diagnosis not present

## 2022-09-15 DIAGNOSIS — Z9981 Dependence on supplemental oxygen: Secondary | ICD-10-CM | POA: Diagnosis not present

## 2022-09-15 DIAGNOSIS — E669 Obesity, unspecified: Secondary | ICD-10-CM | POA: Diagnosis not present

## 2022-09-15 DIAGNOSIS — Z6836 Body mass index (BMI) 36.0-36.9, adult: Secondary | ICD-10-CM | POA: Diagnosis not present

## 2022-09-15 DIAGNOSIS — I5022 Chronic systolic (congestive) heart failure: Secondary | ICD-10-CM | POA: Diagnosis not present

## 2022-09-15 DIAGNOSIS — I480 Paroxysmal atrial fibrillation: Secondary | ICD-10-CM

## 2022-09-15 DIAGNOSIS — J8489 Other specified interstitial pulmonary diseases: Secondary | ICD-10-CM | POA: Diagnosis not present

## 2022-09-15 DIAGNOSIS — G4733 Obstructive sleep apnea (adult) (pediatric): Secondary | ICD-10-CM | POA: Diagnosis not present

## 2022-09-15 DIAGNOSIS — J9611 Chronic respiratory failure with hypoxia: Secondary | ICD-10-CM | POA: Insufficient documentation

## 2022-09-15 DIAGNOSIS — R1900 Intra-abdominal and pelvic swelling, mass and lump, unspecified site: Secondary | ICD-10-CM | POA: Diagnosis not present

## 2022-09-15 DIAGNOSIS — Z7901 Long term (current) use of anticoagulants: Secondary | ICD-10-CM | POA: Insufficient documentation

## 2022-09-15 DIAGNOSIS — I130 Hypertensive heart and chronic kidney disease with heart failure and stage 1 through stage 4 chronic kidney disease, or unspecified chronic kidney disease: Secondary | ICD-10-CM | POA: Diagnosis not present

## 2022-09-15 DIAGNOSIS — Z79899 Other long term (current) drug therapy: Secondary | ICD-10-CM | POA: Diagnosis not present

## 2022-09-15 DIAGNOSIS — I5042 Chronic combined systolic (congestive) and diastolic (congestive) heart failure: Secondary | ICD-10-CM | POA: Diagnosis present

## 2022-09-15 DIAGNOSIS — N189 Chronic kidney disease, unspecified: Secondary | ICD-10-CM | POA: Insufficient documentation

## 2022-09-15 LAB — IRON AND TIBC
Iron: 34 ug/dL (ref 28–170)
Saturation Ratios: 7 % — ABNORMAL LOW (ref 10.4–31.8)
TIBC: 491 ug/dL — ABNORMAL HIGH (ref 250–450)
UIBC: 457 ug/dL

## 2022-09-15 LAB — BASIC METABOLIC PANEL
Anion gap: 12 (ref 5–15)
BUN: 19 mg/dL (ref 8–23)
CO2: 29 mmol/L (ref 22–32)
Calcium: 10.1 mg/dL (ref 8.9–10.3)
Chloride: 98 mmol/L (ref 98–111)
Creatinine, Ser: 0.84 mg/dL (ref 0.44–1.00)
GFR, Estimated: >60 mL/min (ref >60)
Glucose, Bld: 99 mg/dL (ref 70–99)
Potassium: 4.3 mmol/L (ref 3.5–5.1)
Sodium: 139 mmol/L (ref 135–145)

## 2022-09-15 LAB — CBC
HCT: 40.8 % (ref 36.0–46.0)
Hemoglobin: 12.9 g/dL (ref 12.0–15.0)
MCH: 26.5 pg (ref 26.0–34.0)
MCHC: 31.6 g/dL (ref 30.0–36.0)
MCV: 84 fL (ref 80.0–100.0)
Platelets: 274 10*3/uL (ref 150–400)
RBC: 4.86 MIL/uL (ref 3.87–5.11)
RDW: 13.8 % (ref 11.5–15.5)
WBC: 10 10*3/uL (ref 4.0–10.5)
nRBC: 0 % (ref 0.0–0.2)

## 2022-09-15 LAB — ECHOCARDIOGRAM COMPLETE
Area-P 1/2: 3.12 cm2
Calc EF: 38.3 %
S' Lateral: 4.9 cm
Single Plane A2C EF: 40.2 %
Single Plane A4C EF: 34.8 %

## 2022-09-15 LAB — BRAIN NATRIURETIC PEPTIDE: B Natriuretic Peptide: 48 pg/mL (ref 0.0–100.0)

## 2022-09-15 LAB — FERRITIN: Ferritin: 40 ng/mL (ref 11–307)

## 2022-09-15 NOTE — Progress Notes (Signed)
ADVANCED HF CLINIC NOTE   Primary Care: [name], MD Primary Cardiologist: Dr. Jeralyn Ruths ([facility]) HF Cardiologist: Dr. Gala Romney  HPI: Kathleen [name] is a 77 y.o. 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 Recently moved from [place] to [place] and referred by Dr. Jeralyn Ruths to establish cardiology care.   Previous echo with EF 40% but EF recovered. Echo ([facility]) 8/22 EF > 55% RV normal   She has been followed by Dr. Wilford Grist and Jeralyn Ruths at [facility]. Has maintained NSR on Tikosyn.   RHC + LHC (01/17/20): RA 12, PA mean 29, PCWP 13, CI 2.2, Coronary angiogram without any stenosis  RHC (02/08/20): RA 8, PAM 24, wedge 16, CI 2.5  Now followed by Dr. Francine Graven in Pulmonary for her BOOP.    Saw [place] in HF Clinic for the fist time in 9/22.  Zio 11/22: Predominantly AF with v-pacing with periods of sinus rhythm with V-pacing - avg HR of 75 bpm. 2. 2. Rare PACs and PVCs.   Follow up 1/23 stable NYHA III, volume OK on torsemide 2x/week and PRN.  Seen in AF clinic 3/23, continued on dofetilide 250 bid.  Admitted in 4/23 with mechanical fall Admitted in 10/23 with fever/AMS after flu shot Admitted in 11/23 with RSV  Follow up 12/23, NYHA III-IIIb, volume OK. Repeat echo arranged.  Today she returns for HF follow up with her son. Overall feeling fair. Remains on 4 L oxygen. She has very little energy. Doing PT for R shoulder. No significant SOB with ADLs or walking short distances with her RW (on oxygen). Feels swelling in abdomen. Denies palpitations, abnormal bleeding, CP, dizziness, or PND/Orthopnea. Appetite ok. No fever or chills. Weight at home 188 pounds. Taking all medications. She does not have BiPap, having issues with [facility].  Cardiac Studies  - Echo 10/23 and 11/23 EF 35-40% (Dr. Gala Romney felt 40-45%). Having episodes of CP that can wake her up from  sleep. No exertional CP.   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     albuterol (PROVENTIL) (2.5 MG/3ML) 0.083% nebulizer solution Take 2.5 mg by nebulization every 6 (six) hours as needed for wheezing or shortness of breath.     albuterol (VENTOLIN HFA) 108 (90 Base) MCG/ACT inhaler Inhale 2 puffs into the lungs every 4 (four) hours as needed for wheezing or shortness of breath. 8 g 2   azelastine (ASTELIN) 0.1 % nasal spray USE 2 SPRAYS IN EACH NOSTRIL TWICE DAILY 90 mL 0   Cholecalciferol (VITAMIN D3) 50 MCG (2000 UT) TABS Take 2,000 Units by mouth at bedtime.     cyanocobalamin (VITAMIN  B12) 1000 MCG/ML injection Inject 1 mL (1,000 mcg total) into the muscle every 30 (thirty) days. 10 mL 0   cyclobenzaprine (FLEXERIL) 5 MG tablet Take 1 tablet (5 mg total) by mouth at bedtime as needed (Lateral hip pain). 30 tablet 0   docusate sodium (COLACE) 100 MG capsule Take 100 mg by mouth 2 (two) times daily.     dofetilide (TIKOSYN) 250 MCG capsule Take 1 capsule (250 mcg total) by mouth in the morning and at bedtime. 60 capsule 6   febuxostat (ULORIC) 40 MG tablet TAKE 1 TABLET(40 MG) BY MOUTH EVERY NIGHT 90 tablet 0   fluticasone furoate-vilanterol (BREO ELLIPTA) 100-25 MCG/ACT AEPB Inhale 1 puff into the lungs daily. 30 each 5    guaiFENesin (MUCINEX) 600 MG 12 hr tablet Take 600 mg by mouth 2 (two) times daily.     linaclotide (LINZESS) 145 MCG CAPS capsule Take 1 capsule (145 mcg total) by mouth daily as needed (constipation). 90 capsule 0   losartan (COZAAR) 25 MG tablet TAKE 1 TABLET(25 MG) BY MOUTH AT BEDTIME 90 tablet 3   Magnesium 500 MG TABS Take 500 mg by mouth at bedtime.     NEXLETOL 180 MG TABS TAKE 1 TABLET BY MOUTH DAILY 90 tablet 0   nystatin cream (MYCOSTATIN) Apply to affected area 2 times daily 90 g 1   OXYGEN Inhale 4 L into the lungs continuous. Use with resmed ventilator     potassium chloride (KLOR-CON) 10 MEQ tablet Take 1 tablet (10 mEq total) by mouth daily. 90 tablet 3   revefenacin (YUPELRI) 175 MCG/3ML nebulizer solution Take 3 mLs (175 mcg total) by nebulization daily. 90 mL 11   spironolactone (ALDACTONE) 25 MG tablet TAKE 1 TABLET(25 MG) BY MOUTH EVERY MORNING 90 tablet 3   torsemide (DEMADEX) 20 MG tablet Take 1 tablet (20 mg total) by mouth daily. 90 tablet 2   traMADol (ULTRAM) 50 MG tablet Take 1 tablet (50 mg total) by mouth every 6 (six) hours as needed. 360 tablet 1   XARELTO 20 MG TABS tablet TAKE 1 TABLET(20 MG) BY MOUTH DAILY WITH SUPPER 30 tablet 5   No current facility-administered medications for this encounter.   Allergies  Allergen Reactions   Allopurinol Other (See Comments)    Reaction:  Dizziness    Clindamycin Anaphylaxis and Hives   Flublok [Influenza Vaccine Recombinant] Other (See Comments)    Fever 103 with no alternative explanation day after vaccine.  Kathleen Braun Highfill FNP-C   Pneumococcal 13-Val Conj Vacc Itching, Swelling and Rash   Dronedarone Rash   Brovana [Arformoterol]     Caused muscle pain   Budesonide     Caused extreme joint pain   Entresto [Sacubitril-Valsartan] Other (See Comments)    hypotension   Fosamax [Alendronate Sodium] Nausea Only   Jardiance [Empagliflozin]     Caused a vaginal infection   Meperidine Nausea And Vomiting    Microplegia Msa-Msg [Plegisol]     Loopy,diarrhea   Rosuvastatin Other (See Comments)    Reaction:  Muscle spasms    Tetracycline Hives   Adhesive [Tape] Rash   Lovastatin Rash and Other (See Comments)    Muscle Pain   Social History   Socioeconomic History   Marital status: Widowed    Spouse name: Not on file   Number of children: 1   Years of education: Not on file   Highest education level: Not on file  Occupational History   Occupation: retired  Tobacco Use  Smoking status: Former    Packs/day: 1.00    Years: 20.00    Additional pack years: 0.00    Total pack years: 20.00    Types: Cigarettes    Quit date: 06/09/1987    Years since quitting: 35.2    Passive exposure: Past   Smokeless tobacco: Never   Tobacco comments:    Former smoker 12/25/21  Vaping Use   Vaping Use: Never used  Substance and Sexual Activity   Alcohol use: No    Alcohol/week: 0.0 standard drinks of alcohol   Drug use: No   Sexual activity: Not Currently  Other Topics Concern   Not on file  Social History Narrative   [place].  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  Her son works in [employer].   Social Determinants of Health   Financial Resource Strain: Low Risk  (05/21/2022)   Overall Financial Resource Strain (CARDIA)    Difficulty of Paying Living Expenses: Not very hard  Food Insecurity: No Food Insecurity (05/21/2022)   Hunger Vital Sign    Worried About Running Out of Food in the Last Year: Never true    Ran Out of Food in the Last Year: Never true  Transportation Needs: No Transportation Needs (05/21/2022)   PRAPARE - Administrator, Civil ServiceTransportation    Lack of Transportation (Medical): No    Lack of Transportation (Non-Medical): No  Physical Activity: Insufficiently Active (05/21/2022)   Exercise Vital Sign    Days of Exercise per Week: 2 days    Minutes of Exercise per Session: 20 min  Stress: No Stress Concern Present (05/21/2022)   [employer]  [employer] - Occupational Stress Questionnaire    Feeling of Stress : Only a little  Social Connections: Moderately Isolated (05/21/2022)   Social Connection and Isolation Panel [NHANES]    Frequency of Communication with Friends and Family: More than three times a week    Frequency of Social Gatherings with Friends and Family: More than three times a week    Attends Religious Services: Never    Database administratorActive Member of Clubs or Organizations: Yes    Attends Engineer, structuralClub or Organization Meetings: More than 4 times per year    Marital Status: Widowed  Intimate Partner Violence: Not At Risk (05/21/2022)   Humiliation, Afraid, Rape, and Kick questionnaire    Fear of Current or Ex-Partner: No    Emotionally Abused: No    Physically Abused: No    Sexually Abused: No   Family History  Problem Relation Age of Onset   Breast cancer Mother [id number]       3 different times   Pancreatic cancer Mother    Emphysema Father    Healthy Brother    Breast cancer Cousin    Valvular heart disease Son    Obesity Daughter    Colon cancer Neg Hx    Esophageal cancer Neg Hx    Stomach cancer Neg Hx    Inflammatory bowel disease Neg Hx    Liver disease Neg Hx    Rectal cancer Neg Hx    BP 122/70   Pulse 74   Wt 86.3 kg (190 lb 3.2 oz)   SpO2 99% Comment: Patient is on 4-liters of oxygen.  BMI 36.53 kg/m   Wt Readings from Last 3 Encounters:  09/15/22 86.3 kg (190 lb 3.2 oz)  07/30/22 84.9 kg (187 lb 3.2 oz)  07/29/22 83.6 kg (184 lb 3.2 oz)   PHYSICAL  EXAM: General:  NAD. No resp difficulty, arrived in [facility] on oxygen HEENT: Normal Neck: Supple. No JVD, thick neck. Carotids 2+ bilat; no bruits. No lymphadenopathy or thryomegaly appreciated. Cor: PMI nondisplaced. Regular rate & rhythm. No rubs, gallops or murmurs. Lungs: Clear Abdomen: Soft, obese, nontender, nondistended. No hepatosplenomegaly. No bruits or masses. Good bowel sounds. Extremities: No cyanosis, clubbing, rash, edema Neuro: Alert &  oriented x 3, cranial nerves grossly intact. Moves all 4 extremities w/o difficulty. Affect pleasant.  Device interrogation (personally reviewed): OptiVol has been up but now downtrending, thoracic impedence at baseline, no recent AF, no VT, 0.9 hr/day activity  ASSESSMENT & PLAN:   1. Chronic systolic/diastolic HF - Echo (5/20): EF 80%. RV ok  - Echo (10/23) and (11/23) EF 35-40% (Dr. Gala Romney thought 40-45%) - R/L cath (10/23):  minimal CAD.  - Chronic NYHA III-IIIb, mostly due to lung disease and obesity. Volume status ok on exam and Optivol. - Continue torsemide 20 mg daily/10 KCL daily. - Continue losartan 25 mg qhs (failed Entresto due to low BP) - Continue spiro 25 mg daily. - No b-blocker with recent low BPs and severe lung disease. - No Jardiance with frequent UTIs. - Encouraged compliance with Cardiomems. - Echo today EF appears ~ 40% on my read, official results pending - Labs today.  2. 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 Francine Graven) - Followed by Dr. Vassie Loll - Sats 99% on 4L today.  3. Persistent AF - s/p AVN ablation and PM (failed upgrade to BiV) - maintaining NSR on Tikosyn (Zio showed significant AF burden) but in NSR by interrogation today Minimal AF on device interrogation - Followed by EP/AF Clinic - Continue Xarelto. No bleeding issues.  4. Morbid obesity  - Body mass index is 36.53 kg/m. - Continue weight loss efforts  5. OSA - Needs BiPap. - Her son will call [facility] today  6. Fatigue - suspect multifactorial - Needs BiPap - CBC and iron studies today.  Follow up in 4 months with Dr. Gala Romney  Jacklynn Ganong, FNP  11:11 AM

## 2022-09-15 NOTE — Patient Instructions (Signed)
Labs done today. We will contact you only if your labs are abnormal.  No medication changes were made. Please continue all current medications as prescribed.  Your physician recommends that you schedule a follow-up appointment in: 4 months with Dr. Gala Romney. Please contact our office in July to schedule a August appointment.   If you have any questions or concerns before your next appointment please send [place] a message through [place] or call our office at [phone].    TO LEAVE A MESSAGE FOR THE NURSE SELECT OPTION 2, PLEASE LEAVE A MESSAGE INCLUDING: YOUR NAME DATE OF BIRTH CALL BACK NUMBER REASON FOR CALL**this is important as we prioritize the call backs  YOU WILL RECEIVE A CALL BACK THE SAME DAY AS LONG AS YOU CALL BEFORE 4:00 PM   Do the following things EVERYDAY: Weigh yourself in the morning before breakfast. Write it down and keep it in a log. Take your medicines as prescribed Eat low salt foods--Limit salt (sodium) to 2000 mg per day.  Stay as active as you can everyday Limit all fluids for the day to less than 2 liters   At the [facility], you and your health needs are our priority. As part of our continuing mission to provide you with exceptional heart care, we have created designated Provider Care Teams. These Care Teams include your primary Cardiologist (physician) and Advanced Practice Providers (APPs- Physician Assistants and Nurse Practitioners) who all work together to provide you with the care you need, when you need it.   You may see any of the following providers on your designated Care Team at your next follow up: Dr Arvilla Meres Dr Carron Curie, NP Robbie Lis, [place] Karle Plumber, PharmD   Please be sure to bring in all your medications bottles to every appointment.

## 2022-09-15 NOTE — Progress Notes (Signed)
Echocardiogram 2D Echocardiogram has been performed.  Kathleen [name] 09/15/2022, 11:08 AM

## 2022-09-16 ENCOUNTER — Other Ambulatory Visit (HOSPITAL_COMMUNITY): Payer: Self-pay

## 2022-09-16 DIAGNOSIS — I5022 Chronic systolic (congestive) heart failure: Secondary | ICD-10-CM

## 2022-09-16 NOTE — Addendum Note (Signed)
Encounter addended by: Jacklynn Ganong, FNP on: 09/16/2022 10:07 AM  Actions taken: Alternative orders not taken and original order placed, Order list changed

## 2022-09-17 ENCOUNTER — Ambulatory Visit (INDEPENDENT_AMBULATORY_CARE_PROVIDER_SITE_OTHER): Payer: Medicare Other | Admitting: Neurology

## 2022-09-17 ENCOUNTER — Encounter: Payer: Self-pay | Admitting: Neurology

## 2022-09-17 VITALS — BP 129/60 | HR 106 | Ht 61.0 in | Wt 191.2 lb

## 2022-09-17 DIAGNOSIS — F0781 Postconcussional syndrome: Secondary | ICD-10-CM

## 2022-09-17 DIAGNOSIS — G629 Polyneuropathy, unspecified: Secondary | ICD-10-CM | POA: Diagnosis not present

## 2022-09-17 DIAGNOSIS — R42 Dizziness and giddiness: Secondary | ICD-10-CM

## 2022-09-17 DIAGNOSIS — G44229 Chronic tension-type headache, not intractable: Secondary | ICD-10-CM | POA: Diagnosis not present

## 2022-09-17 MED ORDER — GABAPENTIN 100 MG PO CAPS: 100.0000 mg | ORAL_CAPSULE | Freq: Every day | ORAL | 0 refills | Status: DC

## 2022-09-17 NOTE — Patient Instructions (Signed)
Continue current medications Continue with physical therapy Trial of gabapentin 100 mg nightly Follow-up in 1 year or sooner if worse

## 2022-09-17 NOTE — Progress Notes (Signed)
[facility]  PATIENT: Kathleen [name] DOB: [birth year]  REQUESTING CLINICIAN: [name], MD HISTORY FROM: Patient  REASON FOR VISIT: Fall, post traumatic headaches, post concussive syndrome    HISTORICAL  CHIEF COMPLAINT:  Chief Complaint  Patient presents with   Follow-up    Rm 17, alone Concussion with unknown loss of consciousness status & Post concussive syndrome(Has dizziness if moves head frequently) Chronic tension-type headache, not intractable(pt stated that they haven't had any in the past month)   Gait abnormality (feels like that's improving)   Paraesthesia bilateral feet (ongoing a year)     INTERVAL HISTORY 09/17/2022:  Patient presents today for follow-up, last visit was 6 months ago.  At that time we started her on amitriptyline, she states she could not tolerate the medication, was making her very sleepy and loopy.  She self discontinued.  In terms of the headache she has been doing better, actually headaches improved and the last episode was a month and a half ago.  Currently she is complaining of occasional dizziness.  She reports while in bed if she turned her head to the right she will have less than a minute dizziness.  These episodes are less severe than her previous vertigo that she experienced which required Epley maneuver.  No falls due to the dizziness.  She is also complaining of bilateral feet numbness that she describes as feeling like she is wearing socks when in fact she is not wearing socks.  She denies any pain. She is doing PT but only for shoulder pain    HISTORY OF PRESENT ILLNESS:  This is a 77 year old woman past medical history of COPD, atrial fibrillation on anticoagulation and CHF who is presenting for postconcussive syndrome.  Patient reports having a fall back in April.  Patient states she was sitting on her rollator, she got up and lost control of the walker then she fell and and hit her head on a concrete block.   She is not sure how long she was out but she had severe headaches then.  She was taken to the ED and the initial head CT showed subarachnoid hemorrhage.  Since then she had experienced initially severe vertigo and severe headache.  She has completed vestibular therapy, vertigo is resolved but she still having periods of lightheadedness that she described as unsteadiness.  In terms of the headaches she still having headaches, on average 4 headaches per week, headaches are described as throbbing pain and sometimes bandlike pain that is across her head.  Currently she has not been taking any medication for headaches. She reports on September 21, again in her apartment she has episode of unsteadiness but luckily for her she fell on her bed. Since the accident, she is having difficulty following instructions, she has back pain for which she takes tramadol nightly, she has blurry vision after reading 30 minutes and then she has to stop and also complain of decreased hearing on the right side.  She does report low energy.  Currently she is not doing physical therapy at her assisted living facility, she complaints of low energy but she does walk from her room to the diner.    OTHER MEDICAL CONDITIONS: COPD, Atrial fibrillation, CHF   REVIEW OF SYSTEMS: Full 14 system review of systems performed and negative with exception of: As noted in the HPI   ALLERGIES: Allergies  Allergen Reactions   Allopurinol Other (See Comments)    Reaction:  Dizziness    Clindamycin  Anaphylaxis and Hives   Flublok [Influenza Vaccine Recombinant] Other (See Comments)    Fever 103 with no alternative explanation day after vaccine.  Clydie Braun Highfill FNP-C   Pneumococcal 13-Val Conj Vacc Itching, Swelling and Rash   Dronedarone Rash   Brovana [Arformoterol]     Caused muscle pain   Budesonide     Caused extreme joint pain   Entresto [Sacubitril-Valsartan] Other (See Comments)    hypotension   Fosamax [Alendronate Sodium]  Nausea Only   Jardiance [Empagliflozin]     Caused a vaginal infection   Meperidine Nausea And Vomiting   Microplegia Msa-Msg [Plegisol]     Loopy,diarrhea   Rosuvastatin Other (See Comments)    Reaction:  Muscle spasms    Tetracycline Hives   Adhesive [Tape] Rash   Lovastatin Rash and Other (See Comments)    Muscle Pain    HOME MEDICATIONS: Outpatient Medications Prior to Visit  Medication Sig Dispense Refill   acetaminophen (TYLENOL) 650 MG CR tablet Take 1,300 mg by mouth every 8 (eight) hours as needed for pain.     albuterol (PROVENTIL) (2.5 MG/3ML) 0.083% nebulizer solution Take 2.5 mg by nebulization every 6 (six) hours as needed for wheezing or shortness of breath.     albuterol (VENTOLIN HFA) 108 (90 Base) MCG/ACT inhaler Inhale 2 puffs into the lungs every 4 (four) hours as needed for wheezing or shortness of breath. 8 g 2   azelastine (ASTELIN) 0.1 % nasal spray USE 2 SPRAYS IN EACH NOSTRIL TWICE DAILY 90 mL 0   Cholecalciferol (VITAMIN D3) 50 MCG (2000 UT) TABS Take 2,000 Units by mouth at bedtime.     cyanocobalamin (VITAMIN B12) 1000 MCG/ML injection Inject 1 mL (1,000 mcg total) into the muscle every 30 (thirty) days. 10 mL 0   cyclobenzaprine (FLEXERIL) 5 MG tablet Take 1 tablet (5 mg total) by mouth at bedtime as needed (Lateral hip pain). 30 tablet 0   docusate sodium (COLACE) 100 MG capsule Take 100 mg by mouth 2 (two) times daily.     dofetilide (TIKOSYN) 250 MCG capsule Take 1 capsule (250 mcg total) by mouth in the morning and at bedtime. 60 capsule 6   febuxostat (ULORIC) 40 MG tablet TAKE 1 TABLET(40 MG) BY MOUTH EVERY NIGHT 90 tablet 0   fluticasone furoate-vilanterol (BREO ELLIPTA) 100-25 MCG/ACT AEPB Inhale 1 puff into the lungs daily. 30 each 5   guaiFENesin (MUCINEX) 600 MG 12 hr tablet Take 600 mg by mouth 2 (two) times daily.     linaclotide (LINZESS) 145 MCG CAPS capsule Take 1 capsule (145 mcg total) by mouth daily as needed (constipation). 90 capsule 0    losartan (COZAAR) 25 MG tablet TAKE 1 TABLET(25 MG) BY MOUTH AT BEDTIME 90 tablet 3   Magnesium 500 MG TABS Take 500 mg by mouth at bedtime.     NEXLETOL 180 MG TABS TAKE 1 TABLET BY MOUTH DAILY 90 tablet 0   nystatin cream (MYCOSTATIN) Apply to affected area 2 times daily 90 g 1   OXYGEN Inhale 4 L into the lungs continuous. Use with resmed ventilator     potassium chloride (KLOR-CON) 10 MEQ tablet Take 1 tablet (10 mEq total) by mouth daily. 90 tablet 3   revefenacin (YUPELRI) 175 MCG/3ML nebulizer solution Take 3 mLs (175 mcg total) by nebulization daily. 90 mL 11   spironolactone (ALDACTONE) 25 MG tablet TAKE 1 TABLET(25 MG) BY MOUTH EVERY MORNING 90 tablet 3   torsemide (DEMADEX) 20 MG tablet Take  1 tablet (20 mg total) by mouth daily. 90 tablet 2   traMADol (ULTRAM) 50 MG tablet Take 1 tablet (50 mg total) by mouth every 6 (six) hours as needed. 360 tablet 1   XARELTO 20 MG TABS tablet TAKE 1 TABLET(20 MG) BY MOUTH DAILY WITH SUPPER 30 tablet 5   No facility-administered medications prior to visit.    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    Concussion 10/04/2021   COPD (chronic obstructive pulmonary disease)    Gout    Hypertension    Longstanding persistent atrial fibrillation    on Xarelto   Nonischemic cardiomyopathy    Obesity    Pacemaker    Spontaneous pneumothorax 2013    PAST SURGICAL HISTORY: Past Surgical History:  Procedure Laterality Date   ABDOMINAL HYSTERECTOMY     APPENDECTOMY     ATRIAL FIBRILLATION ABLATION  07/20/2013   by Dr Christin FudgeHegland   AV nodal ablation  11/01/2013   by Dr Christin FudgeHegland, repeated by Dr Wilford GristKoontz   BREAST BIOPSY Bilateral 1997   negative   CARDIAC CATHETERIZATION     CHOLECYSTECTOMY     HERNIA REPAIR     PACEMAKER INSERTION   06/2017   MDT Viva CRT-P implanted by Dr Christin FudgeHegland after AV nodal ablation,  LV lead could not be placed   PACEMAKER INSERTION  05/2020   with lead bundle   RIGHT/LEFT HEART CATH AND CORONARY ANGIOGRAPHY N/A 03/20/2022   Procedure: RIGHT/LEFT HEART CATH AND CORONARY ANGIOGRAPHY;  Surgeon: SwazilandJordan, Peter M, MD;  [facility];  Service: Cardiovascular;  Laterality: N/A;    FAMILY HISTORY: Family History  Problem Relation Age of Onset   Breast cancer Mother [id number]       3 different times   Pancreatic cancer Mother    Emphysema Father    Healthy Brother    Breast cancer Cousin    Valvular heart disease Son    Obesity Daughter    Colon cancer Neg Hx    Esophageal cancer Neg Hx    Stomach cancer Neg Hx    Inflammatory bowel disease Neg Hx    Liver disease Neg Hx    Rectal cancer Neg Hx     SOCIAL HISTORY: Social History   Socioeconomic History   Marital status: Widowed    Spouse name: Not on file   Number of children: 1   Years of education: Not on file   Highest education level: Not on file  Occupational History   Occupation: retired  Tobacco Use   Smoking status: Former    Packs/day: 1.00    Years: 20.00    Additional pack years: 0.00    Total pack years: 20.00    Types: Cigarettes    Quit date: 06/09/1987    Years since quitting: 35.2    Passive exposure: Past   Smokeless tobacco: Never   Tobacco comments:    Former smoker 12/25/21  Vaping Use   Vaping Use: Never used  Substance and Sexual Activity   Alcohol use: No    Alcohol/week: 0.0 standard drinks of alcohol   Drug use: No   Sexual activity: Not Currently  Other Topics Concern   Not on file  Social History Narrative   [place] lives in [place]  alone.  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  Her son works in [employer].   Social Determinants of Health   Financial Resource Strain: Low Risk  (05/21/2022)   Overall Financial Resource Strain (CARDIA)    Difficulty of Paying Living  Expenses: Not very hard  Food Insecurity: No Food Insecurity (05/21/2022)   Hunger Vital Sign    Worried About Running Out of Food in the Last Year: Never true    Ran Out of Food in the Last Year: Never true  Transportation Needs: No Transportation Needs (05/21/2022)   PRAPARE - Administrator, Civil Service (Medical): No    Lack of Transportation (Non-Medical): No  Physical Activity: Insufficiently Active (05/21/2022)   Exercise Vital Sign    Days of Exercise per Week: 2 days    Minutes of Exercise per Session: 20 min  Stress: No Stress Concern Present (05/21/2022)   [employer] of Occupational Health - Occupational Stress Questionnaire    Feeling of Stress : Only a little  Social Connections: Moderately Isolated (05/21/2022)   Social Connection and Isolation Panel [NHANES]    Frequency of Communication with Friends and Family: More than three times a week    Frequency of Social Gatherings with Friends and Family: More than three times a week    Attends Religious Services: Never    Database administrator or Organizations: Yes    Attends Engineer, structural: More than 4 times per year    Marital Status: Widowed  Intimate Partner Violence: Not At Risk (05/21/2022)   Humiliation, Afraid, Rape, and Kick questionnaire    Fear of Current or Ex-Partner: No    Emotionally Abused: No    Physically Abused: No    Sexually Abused: No    PHYSICAL EXAM   GENERAL EXAM/CONSTITUTIONAL: Vitals:  Vitals:   09/17/22 1526  BP: 129/60  Pulse: (!) 106  Weight: 191 lb 3.2 oz (86.7 kg)  Height: 5\' 1"  (1.549 m)   Body mass index is 36.13 kg/m. Wt Readings from Last 3 Encounters:  09/17/22 191 lb 3.2 oz (86.7 kg)  09/15/22 190 lb 3.2 oz (86.3 kg)  07/30/22 187 lb 3.2 oz (84.9 kg)   Patient is in no distress; well developed, nourished and groomed; neck is supple, She uses supplemental oxygen for her COPD  EYES: Visual fields full to confrontation, Extraocular  movements intacts,   MUSCULOSKELETAL: Gait, strength, tone, movements noted in Neurologic exam below  NEUROLOGIC: MENTAL STATUS:      No data to display         awake, alert, oriented to person, place and time recent and remote memory intact normal attention and concentration language fluent, comprehension intact, naming intact fund of knowledge appropriate  CRANIAL NERVE:  2nd, 3rd, 4th, 6th - visual fields full to confrontation, extraocular muscles intact, no nystagmus 5th - facial sensation symmetric 7th - facial strength symmetric 8th - hearing intact 9th - palate elevates symmetrically, uvula midline 11th - shoulder shrug symmetric 12th - tongue protrusion midline  MOTOR:  normal bulk and tone, full strength in the BUE, BLE  SENSORY:  normal and symmetric to light touch  COORDINATION:  finger-nose-finger, fine finger movements normal  GAIT/STATION:  Walk with a rollator     DIAGNOSTIC DATA (LABS, IMAGING, TESTING) - I reviewed patient records, labs, notes, testing and imaging myself where available.  Lab Results  Component Value Date   WBC 10.0 09/15/2022   HGB 12.9  09/15/2022   HCT 40.8 09/15/2022   MCV 84.0 09/15/2022   PLT 274 09/15/2022      Component Value Date/Time   NA 139 09/15/2022 1134   NA 130 (L) 12/28/2012 0748   K 4.3 09/15/2022 1134   K 4.6 12/28/2012 0748   CL 98 09/15/2022 1134   CL 95 (L) 12/28/2012 0748   CO2 29 09/15/2022 1134   CO2 23 12/28/2012 0748   GLUCOSE 99 09/15/2022 1134   GLUCOSE 391 (H) 12/28/2012 0748   BUN 19 09/15/2022 1134   BUN 32 (H) 12/28/2012 0748   CREATININE 0.84 09/15/2022 1134   CREATININE 1.29 12/28/2012 0748   CALCIUM 10.1 09/15/2022 1134   CALCIUM 14.3 (HH) 06/26/2019 1618   PROT 6.6 04/28/2022 1200   ALBUMIN 4.1 04/28/2022 1200   AST 14 (L) 04/28/2022 1200   ALT 14 04/28/2022 1200   ALKPHOS 26 (L) 04/28/2022 1200   BILITOT 0.6 04/28/2022 1200   GFRNONAA >60 09/15/2022 1134   GFRNONAA 43  (L) 12/28/2012 0748   GFRAA 58 (L) 06/28/2019 0427   GFRAA 50 (L) 12/28/2012 0748   Lab Results  Component Value Date   CHOL 174 08/07/2021   HDL 35.00 (L) 08/07/2021   LDLCALC 99 08/07/2021   TRIG 200.0 (H) 08/07/2021   CHOLHDL 5 08/07/2021   Lab Results  Component Value Date   HGBA1C 5.8 (H) 04/30/2022   No results found for: "VITAMINB12" Lab Results  Component Value Date   TSH 2.949 12/25/2021    Head CT 10/04/21 There are small foci of subarachnoid hemorrhage in the left parietal lobe. There is no epidural or subdural hematoma. Atrophy. There is subcutaneous hematoma in the right parietal scalp   ASSESSMENT AND PLAN  77 y.o. year old female with COPD, CHF, atrial fibrillation on anticoagulation who is presenting for follow-up for postconcussive syndrome.  In terms of the headaches, they improved, currently, her last headache episode was 1-1/28-month ago.  Her current complaint right now is occasional dizziness and bilateral feet numbness consistent with peripheral neuropathy.  I will start her on a very low-dose 100 mg gabapentin at night.  Patient will contact me within the month to update me on the result.  Continue physical therapy, continue current medications and follow-up in 1 year or sooner if worse.    1. Post concussive syndrome   2. Chronic tension-type headache, not intractable   3. Dizziness   4. Peripheral polyneuropathy     Patient Instructions  Continue current medications Continue with physical therapy Trial of gabapentin 100 mg nightly Follow-up in 1 year or sooner if worse  No orders of the defined types were placed in this encounter.   Meds ordered this encounter  Medications   gabapentin (NEURONTIN) 100 MG capsule    Sig: Take 1 capsule (100 mg total) by mouth at bedtime.    Dispense:  30 capsule    Refill:  0    Return in about 1 year (around 09/17/2023).  I have spent a total of 30 minutes dedicated to this patient today, preparing to  see patient, performing a medically appropriate examination and evaluation, ordering tests and/or medications and procedures, and counseling and educating the patient/family/caregiver; independently interpreting result and communicating results to the family/patient/caregiver; and documenting clinical information in the electronic medical record.  Windell Norfolk, MD 09/17/2022, 4:13 PM  [facility] [address], [address] [place], [place] [address] [phone]

## 2022-09-18 ENCOUNTER — Telehealth (HOSPITAL_BASED_OUTPATIENT_CLINIC_OR_DEPARTMENT_OTHER): Payer: Self-pay | Admitting: Pulmonary Disease

## 2022-09-18 NOTE — Telephone Encounter (Signed)
Patient wanted to leave a message and have someone call her back regarding bipap issues. States Dr. Vassie Loll has been trying to get bipap machine but having hard time with her medicare insurance. Patient states she saw her cardiologist and her heart usage is 35% and cannot use what we have been sent it. Also states that she is on a ventilator for 2 years since when she needed a bipap through [facility] and they did not have one for her. So a MD did not take her off Bipap, [facility] did. Please advise.

## 2022-09-21 NOTE — Telephone Encounter (Signed)
Called and spoke with patient. Patient stated that [facility] was supposed to replace her bipap but they were having trouble finding one. She stated that they brought her a ventilator instead of a bipap machine. Patient stated that it was [facility] that took her off the bipap machine. Patient said she wanted to let Dr. Vassie Loll know because he's been trying to get her a bipap but he's been having trouble with medicare and that maybe this will make a difference.   Routing to RA as an Financial planner.

## 2022-09-21 NOTE — Telephone Encounter (Signed)
Called and spoke with patient. Patient verbalized understanding. Patient also stated that her ventilator is messed up and [facility] won't come out to fix it. She stated that it's got cigarette smoke in it and they're telling her she don't need to use it because of how her heart is.   RA, please advise.

## 2022-09-21 NOTE — Telephone Encounter (Signed)
Called and spoke with patient. She has been scheduled with TP on Friday.   Nothing further needed.

## 2022-09-22 ENCOUNTER — Telehealth: Payer: Self-pay | Admitting: Adult Health

## 2022-09-22 NOTE — Telephone Encounter (Signed)
This patient's son sent me a long email about her ongoing issues with NIV versus BiPAP  Please let him know that she has a ventilator right now.  This machine is more sophisticated and at a higher level than BiPAP.  Only reason I wanted to switch her to a BiPAP was because the alarms were bothering her. She carries a diagnosis of tracheobronchomalacia for which the ventilator has been approved by insurance.  There are technical problems with insurance approval for BiPAP and that is what we are facing right now with DME and insurance.  She has follow-up appointment with me on 5/30. Meantime she should use the ventilator.  This would be just as effective as BiPAP.  So I want him to be assured that patient care is not being compromised.  Regarding cigarette odor from ventilator, that is something they would have to discuss with DME and consider changing out the machine  I tried to arrange appointment with APP in the interim just to reassess if her status has changed.  Otherwise we can keep appointment on May 30 with me

## 2022-09-22 NOTE — Telephone Encounter (Signed)
PT calling about appt on Friday. States she does not have transportation that day but can come in Thursday after 2:30. No appts avail w/Ms. Parrett then that can accommodate her.  Pls call her to advise. She states she must be seen sooner than later.   [phone] is her #. TY.

## 2022-09-23 ENCOUNTER — Telehealth (HOSPITAL_BASED_OUTPATIENT_CLINIC_OR_DEPARTMENT_OTHER): Payer: Self-pay | Admitting: Pulmonary Disease

## 2022-09-23 NOTE — Telephone Encounter (Signed)
Called and spoke with patient. Patient verbalized understanding. Patient wanted to cancel her appointment with TP and just wait and see Dr. Vassie Loll on the 30th of May. Canceled patients appointment with TP.   Nothing further needed.

## 2022-09-23 NOTE — Telephone Encounter (Signed)
Patient states we told her she could go ahead with new machine but [facility] had to order since they did not have any new machines, but they were hesitant on ordering since heart usage 35% worried and that is not good with the new machine she would receive. Please advise.

## 2022-09-24 ENCOUNTER — Ambulatory Visit (INDEPENDENT_AMBULATORY_CARE_PROVIDER_SITE_OTHER): Payer: Medicare Other | Admitting: Sports Medicine

## 2022-09-24 ENCOUNTER — Ambulatory Visit (HOSPITAL_COMMUNITY)
Admission: RE | Admit: 2022-09-24 | Discharge: 2022-09-24 | Disposition: A | Discharge status: Home / Self Care | Payer: Medicare Other | Source: Ambulatory Visit | Attending: Internal Medicine | Admitting: Internal Medicine

## 2022-09-24 ENCOUNTER — Encounter: Payer: Self-pay | Admitting: Sports Medicine

## 2022-09-24 ENCOUNTER — Ambulatory Visit (INDEPENDENT_AMBULATORY_CARE_PROVIDER_SITE_OTHER): Payer: Medicare Other

## 2022-09-24 DIAGNOSIS — G5603 Carpal tunnel syndrome, bilateral upper limbs: Secondary | ICD-10-CM | POA: Diagnosis not present

## 2022-09-24 DIAGNOSIS — M25512 Pain in left shoulder: Secondary | ICD-10-CM

## 2022-09-24 DIAGNOSIS — M19011 Primary osteoarthritis, right shoulder: Secondary | ICD-10-CM | POA: Diagnosis not present

## 2022-09-24 DIAGNOSIS — I5022 Chronic systolic (congestive) heart failure: Secondary | ICD-10-CM | POA: Diagnosis not present

## 2022-09-24 DIAGNOSIS — M75121 Complete rotator cuff tear or rupture of right shoulder, not specified as traumatic: Secondary | ICD-10-CM

## 2022-09-24 MED ORDER — SODIUM CHLORIDE 0.9 % IV SOLN
510.0000 mg | Freq: Once | INTRAVENOUS | Status: AC
  Administered 2022-09-24: 510 mg via INTRAVENOUS
  Filled 2022-09-24: qty 510

## 2022-09-24 NOTE — Progress Notes (Signed)
Not doing great; Injection took several days to kick in, but not enough to relieve the pain  New problem today; left shoulder pain

## 2022-09-24 NOTE — Telephone Encounter (Signed)
Called and spoke with pt to see what she found out from Bee. Pt said that [facility] first told her that they didn't have a new ventilator in stock and they would have to order it. Pt then said that she was told by [facility] that with her heart function being at 35%, her receiving a new machine would not be good.  Pt said she was told that [facility] would have to research on her receiving a new machine and they told her that if it works out for her getting a new machine or when they had more information for her that [facility] would reach back out to her.  Routing this information to Dr. Vassie Loll as an [place]ain Childes.

## 2022-09-24 NOTE — Progress Notes (Signed)
Kathleen [name] - 77 y.o. female MRN [id number]  Date of birth: 25-Mar-1946  Office Visit Note: Visit Date: 09/24/2022 PCP: [name], MD Referred by: [name], MD  Subjective: Chief Complaint  Patient presents with   Right Shoulder - Pain   HPI: Kathleen [name] is a pleasant 77 y.o. female who presents today for follow-up of chronic right shoulder pain. Here for new problem of left shoulder pain.  Last visit on 08/13/2022 we did proceed with subacromial joint injection for the right shoulder. It took a few days to kick in, but then she started to notice some pain relief and less pain with ROM.  She still does not have full function and still has some pain although less since that injection.  Did start Physical Therapy - has had about 4-5 sessions working on the right shoulder and upper back. Did note that she has some increased range of motion on the right shoulder, but slow progress.  She has new onset of left shoulder pain.  She states that over a week ago she was reaching for a 2 L bottle of soda when she felt a pain within the shoulder, feels like the shoulder "moved" or "gave out" on her.  She was able to move the shoulder and reports did not feel like it was out of the joint.  She does have some soreness in the shoulder with certain motions since then.  Is using topical Voltaren gel.  She does have chronic numbness of bilateral hands and fingers.  Has a history of carpal tunnel syndrome in the past.  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  Did suggest gabapentin 100 mg, she has not started this yet.  Has used cock-up wrist braces in the past, but did not feel like she got great relief.  She is not a surgical candidate given her degree of congestive heart failure as well as COPD, she is chronically on nasal cannula 3-4 L.  Is taken Flexeril 5 mg as well as Tylenol for pain control.   Pertinent ROS were reviewed with the patient and found  to be negative unless otherwise specified above in HPI.   Assessment & Plan: Visit Diagnoses:  1. Acute pain of left shoulder   2. Primary osteoarthritis, right shoulder   3. Complete tear of right rotator cuff, unspecified whether traumatic   4. Bilateral carpal tunnel syndrome    Plan: Discussed with Kathleen [name] that her left shoulder pain that has been a possible subluxation versus irritation of the rotator cuff, although x-rays do not show any acute fracture.  I would like her to still move the arm but hold from therapy or guided rehab for additional 2 weeks, after that 2 weeks she may then begin rotator cuff physical therapy.  I did send a prescription to add to her current physical therapy office for the shoulder at that time.  She may use topical Voltaren gel as well.  Her right shoulder has severe advanced osteoarthritis with concomitant tearing of the right rotator cuff.  She did get some relief from subacromial joint injection.  We may in the future consider ultrasound-guided glenohumeral joint injection to see if she gets more pain relief with this into the joint itself.  She will continue her therapy to help work on improving range of motion and function.  She does seem to have symptoms of carpal tunnel syndrome and has a history of this in the past.  She  is not interested in surgical treatment.  She does have cock up wrist braces that she wore in the past without much relief.  She may resume use at nighttime if she wishes.  She was prescribed gabapentin 100 mg nightly by her neurologist, but has not started yet.  I do not think it would be beneficial for her to start this.  She is agreeable.  She does have tramadol 50 mg that she takes every 6 hours as needed, she may continue this.  Discussed she may want to avoid taking this and the gabapentin at the same time due to side effects, she is understanding.  Follow-up in 4 weeks.  If the left shoulder is not improved, may consider subacromial joint  injection.  Could consider glenohumeral joint injection under ultrasound for the right shoulder.  Follow-up: Return in about 4 weeks (around 10/22/2022) for for bilateral shoulder pain (30-min).   Meds & Orders: No orders of the defined types were placed in this encounter.   Orders Placed This Encounter  Procedures   XR Shoulder Left    - Begin Gabapentin 100mg  qHS  Procedures: No procedures performed      Clinical History: No specialty comments available.  She reports that she quit smoking about 35 years ago. Her smoking use included cigarettes. She has a 20.00 pack-year smoking history. She has been exposed to tobacco smoke. She has never used smokeless tobacco.  Recent Labs    01/19/22 1110 04/30/22 0158  HGBA1C  --  5.8*  LABURIC 5.4  --     Objective:   Vital Signs: There were no vitals taken for this visit.  Physical Exam  Gen: Well-appearing, in no acute distress; non-toxic CV: Well-perfused. Warm.  Resp: There is mild conversational dyspnea; patient with nasal cannula oxygen in place. No wheezing.  Psych: Fluid speech in conversation; appropriate affect; normal thought process Neuro: Sensation intact throughout. No gross coordination deficits.   Ortho Exam - Left shoulder: No overlying swelling or redness.  There is positive TTP palpating over the insertional aspect of the supraspinatus and infraspinatus tendons.  There is near full range of motion actively albeit some pain at endrange external rotation.  There is good strength without significant weakness although pain with resisted ER and drop arm test.  Neurovascular intact distally.  - Wrists: No swelling about the hands.  Positive Tinel's bilaterally at the carpal tunnel.  Imaging: XR Shoulder Left  Result Date: 09/24/2022 3 views of the left shoulder including AP, scapular Y and axial view were ordered and reviewed by myself.  X-rays demonstrate moderate AC joint arthritic change.  There are some mild  glenohumeral joint arthritis with a high riding humeral head indicative of rotator cuff arthropathy.  No acute fracture noted.   XR Shoulder Right   Result Date: 08/13/2022 3 views of the right shoulder including AP, scapular Y and axial views were ordered and reviewed by myself.  X-rays demonstrate significantly high riding humeral head, suggestive of blowout rotator cuff tear.  There is severe advanced osteoarthritis of the glenohumeral joint and AC joint.  No acute fracture noted.  Narrative & Impression  CLINICAL DATA:  Trauma   EXAM: CT CERVICAL SPINE WITHOUT CONTRAST   TECHNIQUE: Multidetector CT imaging of the cervical spine was performed without intravenous contrast. Multiplanar CT image reconstructions were also generated.   RADIATION DOSE REDUCTION: This exam was performed according to the departmental dose-optimization program which includes automated exposure control, adjustment of the mA and/or kV according  to patient size and/or use of iterative reconstruction technique.   COMPARISON:  None.   FINDINGS: Alignment: There is minimal 1-2 mm anterolisthesis at C4-C5 level. This may be due to previous ligament injury and facet degeneration.   Skull base and vertebrae: No recent fracture is seen. Tiny smooth marginated calcifications adjacent to the anterior inferior aspects of bodies of C4 and C5 vertebrae may suggest ununited accessory ossification centers.   Soft tissues and spinal canal: There is no central spinal stenosis.   Disc levels: There is mild encroachment of neural foramina at C3-C4 and C4-C5 levels.   Upper chest: Centrilobular emphysema is seen.   Other: There is inhomogeneous attenuation in the thyroid.   IMPRESSION: No recent fracture is seen in the cervical spine. Minimal anterolisthesis at C4-C5 level may suggest previous ligament injury and facet degeneration.   COPD.  There is inhomogeneous attenuation in the thyroid.     Electronically  Signed   By: Ernie Avena M.D.   On: 10/04/2021 17:40    Past Medical/Family/Surgical/Social History: Medications & Allergies reviewed per EMR, new medications updated. 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   Depression, major, single episode, moderate 04/27/2022   Non-STEMI (non-ST elevated myocardial infarction) 03/18/2022   Hypotension 03/18/2022   Morbid obesity 03/18/2022   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   Low TSH level 12/02/2021   Acute idiopathic gout of left hand 11/19/2021   Acquired hypothyroidism 11/12/2021   Encounter for palliative care involving management of pain 11/12/2021   Post concussive syndrome 11/11/2021   Chronic left-sided low back pain 11/04/2021   Gout 10/01/2021   Hyperlipidemia with target LDL less than 100 08/07/2021   Chronic anticoagulation 07/07/2021   Stage 3a chronic kidney disease 05/23/2021   Dietary iron deficiency 05/23/2021   Age-related osteoporosis without current pathological fracture 04/22/2021   Type 2 diabetes mellitus with diabetic neuropathy, without long-term current use of insulin 04/22/2021   Sleep apnea 01/22/2021   Statin intolerance 10/12/2019   Statin myopathy 02/24/2019   Chronic systolic heart failure 04/06/2018   HTN (hypertension) 04/06/2018   Atrial fibrillation 04/06/2018   Chronic respiratory failure with hypoxia 03/28/2018   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 12/02/2015   Malnutrition of moderate degree 09/11/2015   Past Medical History:  Diagnosis Date   Asthma    BOOP (bronchiolitis obliterans with organizing pneumonia)    CHF (congestive heart  failure)    Chronic renal insufficiency    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    Concussion 10/04/2021   COPD (chronic obstructive pulmonary disease)    Gout    Hypertension    Longstanding persistent atrial fibrillation    on Xarelto   Nonischemic cardiomyopathy    Obesity    Pacemaker    Spontaneous pneumothorax 2013   Family History  Problem Relation Age of Onset   Breast cancer Mother 11       3 different times   Pancreatic cancer Mother    Emphysema Father    Healthy Brother    Breast cancer Cousin    Valvular heart disease Son    Obesity Daughter    Colon cancer Neg Hx  Esophageal cancer Neg Hx    Stomach cancer Neg Hx    Inflammatory bowel disease Neg Hx    Liver disease Neg Hx    Rectal cancer Neg Hx    Past Surgical History:  Procedure Laterality Date   ABDOMINAL HYSTERECTOMY     APPENDECTOMY     ATRIAL FIBRILLATION ABLATION  07/20/2013   by Dr Christin Fudge   AV nodal ablation  11/01/2013   by Dr Christin Fudge, repeated by Dr Wilford Grist   BREAST BIOPSY Bilateral 1997   negative   CARDIAC CATHETERIZATION     CHOLECYSTECTOMY     HERNIA REPAIR     PACEMAKER INSERTION  06/2017   MDT Viva CRT-P implanted by Dr Christin Fudge after AV nodal ablation,  LV lead could not be placed   PACEMAKER INSERTION  05/2020   with lead bundle   RIGHT/LEFT HEART CATH AND CORONARY ANGIOGRAPHY N/A 03/20/2022   Procedure: RIGHT/LEFT HEART CATH AND CORONARY ANGIOGRAPHY;  Surgeon: [place], Peter M, MD;  Location: [facility] INVASIVE CV LAB;  Service: Cardiovascular;  Laterality: N/A;   Social History   Occupational History   Occupation: retired  Tobacco Use   Smoking status: Former    Packs/day: 1.00    Years: 20.00    Additional pack years: 0.00    Total pack years: 20.00    Types: Cigarettes    Quit date: 06/09/1987    Years since quitting: 35.3    Passive exposure: Past   Smokeless tobacco: Never   Tobacco comments:    Former smoker 12/25/21  Vaping Use   Vaping Use: Never used   Substance and Sexual Activity   Alcohol use: No    Alcohol/week: 0.0 standard drinks of alcohol   Drug use: No   Sexual activity: Not Currently

## 2022-09-25 ENCOUNTER — Encounter: Payer: Self-pay | Admitting: Internal Medicine

## 2022-09-25 ENCOUNTER — Ambulatory Visit: Payer: Medicare Other | Admitting: Adult Health

## 2022-09-29 ENCOUNTER — Other Ambulatory Visit: Payer: Self-pay | Admitting: *Deleted

## 2022-09-29 NOTE — Telephone Encounter (Signed)
Patient called and stated she has heard from [place] from [place] who states they have the BiPAP for her and will call to schedule and has not heard anything since last week. Wanted to ask if insurance would would pay for this machine since having lots of issues with insurance covering. Please advise.

## 2022-09-29 NOTE — Telephone Encounter (Signed)
Patient contacted the office stating that she is wanting to switch her Uloric to our office. She planned on doing that in January and the prescription request was sent to her PCP.   Last Fill: 06/10/2022  Labs: 09/15/2022 CBC/BMP WNL  Next Visit: 01/21/2023  Last Visit: 07/23/2022  DX: Acute idiopathic gout of left hand   Current Dose per office note 07/23/2022: Uloric 40 mg daily.   Okay to refill Uloric?

## 2022-09-30 ENCOUNTER — Ambulatory Visit (INDEPENDENT_AMBULATORY_CARE_PROVIDER_SITE_OTHER): Payer: Medicare Other

## 2022-09-30 DIAGNOSIS — I442 Atrioventricular block, complete: Secondary | ICD-10-CM | POA: Diagnosis not present

## 2022-09-30 LAB — CUP PACEART REMOTE DEVICE CHECK
Battery Remaining Longevity: 89 mo
Battery Voltage: 2.99 V
Brady Statistic AP VP Percent: 76.74 %
Brady Statistic AP VS Percent: 0.01 %
Brady Statistic AS VP Percent: 23.25 %
Brady Statistic AS VS Percent: 0 %
Brady Statistic RA Percent Paced: 76.69 %
Brady Statistic RV Percent Paced: 99.99 %
Date Time Interrogation Session: 20240423215325
Implantable Lead Connection Status: 753985
Implantable Lead Connection Status: 753985
Implantable Lead Implant Date: 20150527
Implantable Lead Implant Date: 20211221
Implantable Lead Location: 753858
Implantable Lead Location: 753860
Implantable Lead Model: 3830
Implantable Lead Model: 5076
Implantable Pulse Generator Implant Date: 20211221
Lead Channel Impedance Value: 304 Ohm
Lead Channel Impedance Value: 304 Ohm
Lead Channel Impedance Value: 323 Ohm
Lead Channel Impedance Value: 342 Ohm
Lead Channel Impedance Value: 380 Ohm
Lead Channel Impedance Value: 399 Ohm
Lead Channel Impedance Value: 418 Ohm
Lead Channel Impedance Value: 456 Ohm
Lead Channel Impedance Value: 475 Ohm
Lead Channel Pacing Threshold Amplitude: 0.75 V
Lead Channel Pacing Threshold Amplitude: 0.75 V
Lead Channel Pacing Threshold Amplitude: 1 V
Lead Channel Pacing Threshold Pulse Width: 0.4 ms
Lead Channel Pacing Threshold Pulse Width: 0.4 ms
Lead Channel Pacing Threshold Pulse Width: 0.4 ms
Lead Channel Sensing Intrinsic Amplitude: 1 mV
Lead Channel Sensing Intrinsic Amplitude: 1 mV
Lead Channel Sensing Intrinsic Amplitude: 6.25 mV
Lead Channel Sensing Intrinsic Amplitude: 6.25 mV
Lead Channel Setting Pacing Amplitude: 1.25 V
Lead Channel Setting Pacing Amplitude: 1.5 V
Lead Channel Setting Pacing Amplitude: 2 V
Lead Channel Setting Pacing Pulse Width: 0.4 ms
Lead Channel Setting Pacing Pulse Width: 0.4 ms
Lead Channel Setting Sensing Sensitivity: 2 mV
Zone Setting Status: 755011

## 2022-09-30 MED ORDER — FEBUXOSTAT 40 MG PO TABS: 40.0000 mg | ORAL_TABLET | Freq: Every day | ORAL | 0 refills | Status: DC

## 2022-09-30 NOTE — Telephone Encounter (Signed)
Called and spoke with patient. Patient stated that she wanted to know if her insurance company would pay for the bipap machine that she will be getting. Advised patient she would have to call [employer] or her insurance company to see if they will cover it. Also advised patient that if she needed anything from [place] just to give [place] a call back.   Nothing further needed.

## 2022-10-07 ENCOUNTER — Encounter: Payer: Self-pay | Admitting: Family Medicine

## 2022-10-07 ENCOUNTER — Non-Acute Institutional Stay: Payer: Medicare Other | Admitting: Family Medicine

## 2022-10-07 VITALS — HR 70 | Temp 97.8°F

## 2022-10-07 DIAGNOSIS — J441 Chronic obstructive pulmonary disease with (acute) exacerbation: Secondary | ICD-10-CM

## 2022-10-07 DIAGNOSIS — I5022 Chronic systolic (congestive) heart failure: Secondary | ICD-10-CM

## 2022-10-07 DIAGNOSIS — I480 Paroxysmal atrial fibrillation: Secondary | ICD-10-CM

## 2022-10-07 NOTE — Progress Notes (Signed)
Therapist, nutritional Palliative Care Consult Note Telephone: [phone]  Fax: [phone]   Date of encounter: 10/07/22 2:12 PM PATIENT NAME: Kathleen [name] [address] [place] [address]   [phone] (home)  DOB: 22-Mar-1946 MRN: [id number] PRIMARY CARE PROVIDER:    [name], MD,  [address] [place] [address] [phone]  REFERRING PROVIDER:   [name], MD [address] [place],  [place] [address] [phone]  Emergency Contact:    Contact Information     Name Relation Home Work [place] Son [phone]  [phone]   Kathleen [name], Kathleen [name] [phone]  [phone]   Kathleen [name] Daughter   [phone]       Health Care POA/Health Care Agent   I met face to face with patient in [facility] facility. Palliative Care was asked to follow this patient by consultation request of Kathleen Grandchild, MD to address advance care planning and complex medical decision making. This is an initial/follow up visit.  CODE STATUS: Full Code    ASSESSMENT AND / RECOMMENDATIONS:  PPS: 60%  COPD with acute exacerbation/chronic          respiratory failure Prednisone 40 mg daily x 6 days with food. Cefdinir 300 mg BID x 10 days (has tolerated Keflex previously) Continue current maintenance and rescue nebulizer treatments Continue O2 @ 4L [place]. Notify PCP if fever, increased work of breathing or if no improvement with current therapy.  2.    Chronic systolic heart failure Appear euvolemic Weight is stable at baseline. Continue ARB, beta blocker, torsemide and spironolactone with potassium replacement Continue daily weight  3.    Paroxysmal atrial fibrillation Rate controlled on beta blocker and tikosyn Anticoagulated on Xarelto Given QT prolongation with Tikosyn, will defer use of Azithromycin for COPD exacerbation.  Follow up Palliative Care Visit:  Palliative Care continuing to  follow up by monitoring for changes in appetite, weight, functional and cognitive status for chronic disease progression and management in agreement with patient's stated goals of care. Next visit in 2 weeks or prn.  This visit was coded based on medical decision making (MDM).  Chief Complaint  Palliative Care is continuing to follow pt for chronic medical management in setting of COPD and heart failure with c/o dizziness.  HISTORY OF PRESENT ILLNESS: Kathleen [name] is a 77 y.o. year old female with COPD and systolic heart failure, having increased dizziness today different than her normal vertigo sensation.  Says she feels like she is going to fall or pass out.  She states having pain in bilateral shoulders has some rotator cuff arthopathy in bilateral shoulders, right worse than left. She has been instructed to stop using her left shoulder and plan is to begin PT with both shoulders.  She self administers Vitamin B 12 injections, gave one in her right thigh with circumferential redness but no heat or increased pain. She has not been eating much today but states fluid intake has been good. Denies fever. Has hoarse cough productive of thick yellow sputum when she can get anything up and had wheezing this am. States she normally has trouble in the spring with her COPD and has recently had to have a window air conditioning unit installed in her room as the central air conditioning went out and while having it repaired. She has not received a BiPap unit and her trellegy unit is not working. She remains on 4L per [facility].   ACTIVITIES OF DAILY  LIVING: CONTINENT OF BLADDER/BOWEL? Yes BATHING/DRESSING/FEEDING: Independent   MOBILITY:   Rollator   APPETITE? 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   Depression, major, single episode, moderate (HCC) 04/27/2022   Non-STEMI (non-ST elevated myocardial infarction) (HCC) 03/18/2022   Hypotension 03/18/2022   Morbid obesity (HCC) 03/18/2022   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   Low TSH level 12/02/2021   Acute idiopathic gout of left hand 11/19/2021   Acquired hypothyroidism 11/12/2021   Encounter for palliative care involving management of pain 11/12/2021   Post concussive syndrome 11/11/2021   Chronic left-sided low back pain 11/04/2021   Gout 10/01/2021   Hyperlipidemia with target LDL less than 100 08/07/2021   Chronic anticoagulation 07/07/2021   Stage 3a chronic kidney disease (HCC) 05/23/2021   Dietary iron deficiency 05/23/2021   Age-related osteoporosis without current pathological fracture 04/22/2021   Type 2 diabetes mellitus with diabetic neuropathy, without long-term current use of insulin (HCC) 04/22/2021   Sleep apnea 01/22/2021   Statin intolerance 10/12/2019   Statin myopathy 02/24/2019   Chronic systolic heart failure (HCC) 04/06/2018   HTN (hypertension) 04/06/2018   Atrial fibrillation (HCC) 04/06/2018   Chronic respiratory failure with hypoxia (HCC) 03/28/2018   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 12/02/2015   Malnutrition of moderate degree 09/11/2015   PAST MEDICAL HISTORY:  Active Ambulatory Problems    Diagnosis Date Noted   Malnutrition of moderate degree 09/11/2015   Chronic respiratory failure with hypoxia (HCC) 03/28/2018   Chronic systolic heart failure (HCC) 04/06/2018   HTN (hypertension) 04/06/2018   Atrial fibrillation (HCC) 04/06/2018   Sleep apnea 01/22/2021   Age-related osteoporosis without current pathological fracture  04/22/2021   Type 2 diabetes mellitus with diabetic neuropathy, without long-term current use of insulin (HCC) 04/22/2021   Stage 3a chronic kidney disease (HCC) 05/23/2021   Dietary iron deficiency 05/23/2021   Chronic anticoagulation 07/07/2021   Hyperlipidemia with target LDL less than 100 08/07/2021   Gout 10/01/2021   Chronic left-sided low back pain 11/04/2021   Post concussive syndrome 11/11/2021   Acquired hypothyroidism 11/12/2021   Encounter for palliative care involving management of pain 11/12/2021   Acute idiopathic gout of left hand 11/19/2021   Low TSH level 12/02/2021   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   Non-STEMI (non-ST elevated myocardial infarction) (HCC) 03/18/2022   Hypotension 03/18/2022   Morbid obesity (HCC) 03/18/2022   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 12/02/2015   Statin myopathy 02/24/2019   Statin intolerance 10/12/2019   Depression, major, single episode, moderate (HCC) 04/27/2022   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   Pneumonia 04/29/2022   Heart failure with reduced ejection fraction (HCC) 05/14/2022   Paranasal sinus disease 05/19/2022   Post-viral cough syndrome 06/02/2022   Chronic obstructive pulmonary disease (HCC) 06/28/2022   Palliative care encounter 06/28/2022   Pain in left shoulder 07/23/2022   Resolved Ambulatory Problems    Diagnosis Date Noted   Fever 09/08/2015   Hyponatremia 09/08/2015   Acute respiratory failure with hypoxia (HCC) 09/08/2015   Bacterial pneumonia 09/08/2015   Acute on  chronic respiratory failure with hypoxia (HCC) 09/08/2015   Acute bronchitis 09/17/2015   COPD (chronic obstructive pulmonary disease) (HCC) 04/06/2018   Hypercalcemia 06/26/2019   (HFpEF) heart failure with preserved ejection fraction (HCC) 06/26/2019   Acute kidney injury superimposed on CKD (HCC) 06/26/2019   Generalized abdominal pain    Constipation    Community acquired pneumonia 11/14/2020   Chronic hyperglycemia  01/22/2021   Daytime somnolence 01/22/2021   Penicillin allergy 01/23/2021   Pneumonia of left lower lobe due to infectious organism 04/22/2021   Chronic idiopathic constipation 04/22/2021   Flu vaccine need 04/22/2021   Cellulitis of breast 05/10/2021   Cellulitis of chest wall 05/11/2021   Sepsis (HCC) 05/11/2021   Leukocytosis 05/11/2021   Dehydration 05/11/2021   Acute prerenal azotemia 05/11/2021   Allergic contact dermatitis due to drugs in contact with skin 05/23/2021   Candidiasis of skin 05/23/2021   Deficiency anemia 05/23/2021   Acute gout due to renal impairment involving foot 05/26/2021   Dysphagia 07/07/2021   Iron deficiency 07/07/2021   Abnormal CT scan, colon 07/07/2021   History of colon polyps 07/07/2021   Cough productive of purulent sputum 08/07/2021   LRTI (lower respiratory tract infection) 08/07/2021   Subarachnoid hemorrhage (HCC) 10/04/2021   Subarachnoid bleed (HCC) 10/04/2021   Blood in stool, frank 11/11/2021   Acute cough 11/19/2021   Left hand pain 11/19/2021   Acute conjunctivitis of right eye 03/03/2022   Severe sepsis (HCC) 03/17/2022   Acute on chronic combined systolic and diastolic CHF (congestive heart failure) (HCC) 03/19/2022   Hospital discharge follow-up 03/25/2022   History of severe sepsis 03/25/2022   CAP (community acquired pneumonia) 04/21/2022   RSV infection 05/14/2022   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    Concussion 10/04/2021   Hypertension    Longstanding persistent atrial fibrillation (HCC)    Nonischemic cardiomyopathy (HCC)    Obesity    Pacemaker    Spontaneous pneumothorax 2013   SOCIAL HX:  Social History   Tobacco Use   Smoking status: Former    Packs/day: 1.00    Years: 20.00    Additional pack years:  0.00    Total pack years: 20.00    Types: Cigarettes    Quit date: 06/09/1987    Years since quitting: 35.3    Passive exposure: Past   Smokeless tobacco: Never   Tobacco comments:    Former smoker 12/25/21  Substance Use Topics   Alcohol use: No    Alcohol/week: 0.0 standard drinks of alcohol   FAMILY HX:  Family History  Problem Relation Age of Onset   Breast cancer Mother 56       3 different times   Pancreatic cancer Mother    Emphysema Father    Healthy Brother    Breast cancer Cousin    Valvular heart disease Son    Obesity Daughter    Colon cancer Neg Hx    Esophageal cancer Neg Hx    Stomach cancer Neg Hx    Inflammatory bowel disease Neg Hx    Liver disease Neg Hx    Rectal cancer Neg Hx        Preferred Pharmacy: ALLERGIES:  Allergies  Allergen Reactions   Allopurinol Other (See Comments)  Reaction:  Dizziness    Clindamycin Anaphylaxis and Hives   Flublok [Influenza Vaccine Recombinant] Other (See Comments)    Fever 103 with no alternative explanation day after vaccine.  Clydie Braun Kessler Solly FNP-C   Pneumococcal 13-Val Conj Vacc Itching, Swelling and Rash   Dronedarone Rash   Brovana [Arformoterol]     Caused muscle pain   Budesonide     Caused extreme joint pain   Entresto [Sacubitril-Valsartan] Other (See Comments)    hypotension   Fosamax [Alendronate Sodium] Nausea Only   Jardiance [Empagliflozin]     Caused a vaginal infection   Meperidine Nausea And Vomiting   Microplegia Msa-Msg [Plegisol]     Loopy,diarrhea   Rosuvastatin Other (See Comments)    Reaction:  Muscle spasms    Tetracycline Hives   Adhesive [Tape] Rash   Lovastatin Rash and Other (See Comments)    Muscle Pain     PERTINENT MEDICATIONS:  Outpatient Encounter Medications as of 10/07/2022  Medication Sig   acetaminophen (TYLENOL) 650 MG CR tablet Take 1,300 mg by mouth every 8 (eight) hours as needed for pain.   albuterol (PROVENTIL) (2.5 MG/3ML) 0.083% nebulizer solution Take 2.5  mg by nebulization every 6 (six) hours as needed for wheezing or shortness of breath.   albuterol (VENTOLIN HFA) 108 (90 Base) MCG/ACT inhaler Inhale 2 puffs into the lungs every 4 (four) hours as needed for wheezing or shortness of breath.   azelastine (ASTELIN) 0.1 % nasal spray USE 2 SPRAYS IN EACH NOSTRIL TWICE DAILY   Cholecalciferol (VITAMIN D3) 50 MCG (2000 UT) TABS Take 2,000 Units by mouth at bedtime.   cyanocobalamin (VITAMIN B12) 1000 MCG/ML injection Inject 1 mL (1,000 mcg total) into the muscle every 30 (thirty) days.   cyclobenzaprine (FLEXERIL) 5 MG tablet Take 1 tablet (5 mg total) by mouth at bedtime as needed (Lateral hip pain).   docusate sodium (COLACE) 100 MG capsule Take 100 mg by mouth 2 (two) times daily.   dofetilide (TIKOSYN) 250 MCG capsule Take 1 capsule (250 mcg total) by mouth in the morning and at bedtime.   febuxostat (ULORIC) 40 MG tablet Take 1 tablet (40 mg total) by mouth daily.   fluticasone furoate-vilanterol (BREO ELLIPTA) 100-25 MCG/ACT AEPB Inhale 1 puff into the lungs daily.   gabapentin (NEURONTIN) 100 MG capsule Take 1 capsule (100 mg total) by mouth at bedtime.   guaiFENesin (MUCINEX) 600 MG 12 hr tablet Take 600 mg by mouth 2 (two) times daily.   linaclotide (LINZESS) 145 MCG CAPS capsule Take 1 capsule (145 mcg total) by mouth daily as needed (constipation).   losartan (COZAAR) 25 MG tablet TAKE 1 TABLET(25 MG) BY MOUTH AT BEDTIME   Magnesium 500 MG TABS Take 500 mg by mouth at bedtime.   NEXLETOL 180 MG TABS TAKE 1 TABLET BY MOUTH DAILY   nystatin cream (MYCOSTATIN) Apply to affected area 2 times daily   OXYGEN Inhale 4 L into the lungs continuous. Use with resmed ventilator   potassium chloride (KLOR-CON) 10 MEQ tablet Take 1 tablet (10 mEq total) by mouth daily.   revefenacin (YUPELRI) 175 MCG/3ML nebulizer solution Take 3 mLs (175 mcg total) by nebulization daily.   spironolactone (ALDACTONE) 25 MG tablet TAKE 1 TABLET(25 MG) BY MOUTH EVERY  MORNING   torsemide (DEMADEX) 20 MG tablet Take 1 tablet (20 mg total) by mouth daily.   traMADol (ULTRAM) 50 MG tablet Take 1 tablet (50 mg total) by mouth every 6 (six) hours as needed.  XARELTO 20 MG TABS tablet TAKE 1 TABLET(20 MG) BY MOUTH DAILY WITH SUPPER   No facility-administered encounter medications on file as of 10/07/2022.    History obtained from review of EMR, discussion with patient.   CBC    Component Value Date/Time   WBC 10.0 09/15/2022 1134   RBC 4.86 09/15/2022 1134   HGB 12.9 09/15/2022 1134   HGB 13.4 12/28/2012 0748   HCT 40.8 09/15/2022 1134   HCT 39.2 12/28/2012 0748   PLT 274 09/15/2022 1134   PLT 186 12/28/2012 0748   MCV 84.0 09/15/2022 1134   MCV 87 12/28/2012 0748   MCH 26.5 09/15/2022 1134   MCHC 31.6 09/15/2022 1134   RDW 13.8 09/15/2022 1134   RDW 15.0 (H) 12/28/2012 0748   LYMPHSABS 1.6 05/22/2022 0901   LYMPHSABS 0.4 (L) 12/28/2012 0748   MONOABS 0.7 05/22/2022 0901   MONOABS 0.5 12/28/2012 0748   EOSABS 0.1 05/22/2022 0901   EOSABS 0.0 12/28/2012 0748   BASOSABS 0.1 05/22/2022 0901   BASOSABS 0.0 12/28/2012 0748    CMP    Latest Ref Rng & Units 09/15/2022   11:34 AM 08/03/2022   11:03 AM 05/19/2022   12:11 PM  CMP  Glucose 70 - 99 mg/dL 99  045  409   BUN 8 - 23 mg/dL 19  22  16    Creatinine 0.44 - 1.00 mg/dL 8.11  9.14  7.82   Sodium 135 - 145 mmol/L 139  138  140   Potassium 3.5 - 5.1 mmol/L 4.3  3.8  4.5   Chloride 98 - 111 mmol/L 98  98  103   CO2 22 - 32 mmol/L 29  28  28    Calcium 8.9 - 10.3 mg/dL 95.6  9.5  9.8      I reviewed available labs, medications, imaging, studies and related documents from the EMR.  Records reviewed and summarized above.   Physical Exam: GENERAL: NAD LUNGS: Coarse rales with expiratory wheeze on the right side, diminished breath sounds in left lung, no increased work of breathing, on 4L CARDIAC:  S1S2, RRR with no MRG, No edema, Mild cyanosis of nailbeds and pallor of lips ABD:  Normo-active  BS x 4 quads, soft, non-tender EXTREMITIES: Normal ROM, no deformity, strength equal, No muscle atrophy/subcutaneous fat loss NEURO:  No weakness or cognitive impairment PSYCH:  non-anxious affect, A & O x 3  Thank you for the opportunity to participate in the care of Megan Howey. Please call our main office at [phone] if we can be of additional assistance.    Joycelyn Man FNP-C  [email or web address] .[facility]  Phone:  [phone]

## 2022-10-09 DIAGNOSIS — J441 Chronic obstructive pulmonary disease with (acute) exacerbation: Secondary | ICD-10-CM | POA: Insufficient documentation

## 2022-10-12 ENCOUNTER — Other Ambulatory Visit: Payer: Self-pay | Admitting: Internal Medicine

## 2022-10-12 ENCOUNTER — Ambulatory Visit: Payer: Medicare Other | Attending: Cardiology

## 2022-10-12 DIAGNOSIS — I5022 Chronic systolic (congestive) heart failure: Secondary | ICD-10-CM | POA: Diagnosis not present

## 2022-10-12 DIAGNOSIS — Z950 Presence of cardiac pacemaker: Secondary | ICD-10-CM | POA: Diagnosis not present

## 2022-10-12 DIAGNOSIS — M25559 Pain in unspecified hip: Secondary | ICD-10-CM

## 2022-10-13 NOTE — Telephone Encounter (Signed)
Last Fill: 07/23/2022  Next Visit: 01/21/2023  Last Visit: 07/23/2022  Dx: Lateral pain of hip   Current Dose per office note on 07/23/2022: flexeril 5 mg at night as needed   Okay to refill Flexeril?

## 2022-10-14 ENCOUNTER — Telehealth: Payer: Self-pay

## 2022-10-14 NOTE — Telephone Encounter (Signed)
Remote ICM transmission received.  Attempted call to patient regarding ICM remote transmission and left detailed message per DPR.  Advised to return call for any fluid symptoms or questions. Next ICM remote transmission scheduled 11/16/2022.    

## 2022-10-14 NOTE — Progress Notes (Signed)
EPIC Encounter for ICM Monitoring  Patient Name: Kathleen [name] is a 77 y.o. female Date: 10/14/2022 Primary Care Physican: [name], MD Primary Cardiologist: Bensimhon Electrophysiologist: Camnitz Bi-V Pacing: 100%          05/19/2023 Weight: 184 lbs  06/30/2022 Weight: 181 lbs 07/03/2022 Weight: 188 lbs 07/15/2022 Weight: 187.4 lbs 07/22/2022 Weight: 191 lbs 07/28/2022 Weight: 184.6 lbs 09/07/2022 Weight: 187 lbs                                                     Attempted call to patient and unable to reach.  Left detailed message per DPR regarding transmission. Transmission reviewed.    Diet:  She lives in an assisted living  Eats foods that are prepared at the facility she lives in which are not low salt.     Optivol thoracic impedance suggesting fluid levels returned to normal 4/27.   Prescribed:  Torsemide 20 mg take 1 tablet(s) (20 mg total) by mouth daily (changed to daily 2/14).   Potassium 10 mEq take 1 tablet (10 mEq total) by mouth daily (started 2/14) Spironolactone 25 mg take 1 tablet daily   Labs: 08/18/2022 Creatinine 0.8,   BUN 24, Potassium 4.3, Sodium 142, GFR 77 08/03/2022 Creatinine 0.80, BUN 22, Potassium 3.8, Sodium 138, GFR >60 05/19/2022 Creatinine 0.72, BUN 16, Potassium 4.5, Sodium 140  A complete set of results can be found in Results Review.   Recommendations:  Left voice mail with ICM number and encouraged to call if experiencing any fluid symptoms.   Follow-up plan: ICM clinic phone appointment on 11/16/2022.  91 day device clinic remote transmission 12/30/2022.     EP/Cardiology Office Visits:    Recall 01/13/2023 with Dr Gala Romney.  Recall 05/13/2023 with Francis Dowse, PA or Otilio Saber, [place].     Copy of ICM check sent to Dr. Elberta Fortis.   3 month ICM trend: 10/12/2022.    12-14 Month ICM trend:     Karie Soda, RN 10/14/2022 12:27 PM

## 2022-10-19 ENCOUNTER — Telehealth: Payer: Self-pay | Admitting: Sports Medicine

## 2022-10-19 NOTE — Telephone Encounter (Signed)
[facility] P/T is waiting on notes they are stating they have sent several faxes and have not received anything back. Please call [phone] Damme)

## 2022-10-19 NOTE — Telephone Encounter (Signed)
Refaxed the paper work today

## 2022-10-21 ENCOUNTER — Encounter: Payer: Self-pay | Admitting: Sports Medicine

## 2022-10-21 ENCOUNTER — Ambulatory Visit (INDEPENDENT_AMBULATORY_CARE_PROVIDER_SITE_OTHER): Payer: Medicare Other | Admitting: Sports Medicine

## 2022-10-21 DIAGNOSIS — G5603 Carpal tunnel syndrome, bilateral upper limbs: Secondary | ICD-10-CM | POA: Diagnosis not present

## 2022-10-21 DIAGNOSIS — G5793 Unspecified mononeuropathy of bilateral lower limbs: Secondary | ICD-10-CM | POA: Diagnosis not present

## 2022-10-21 DIAGNOSIS — M12812 Other specific arthropathies, not elsewhere classified, left shoulder: Secondary | ICD-10-CM

## 2022-10-21 DIAGNOSIS — M19011 Primary osteoarthritis, right shoulder: Secondary | ICD-10-CM | POA: Diagnosis not present

## 2022-10-21 DIAGNOSIS — M25512 Pain in left shoulder: Secondary | ICD-10-CM

## 2022-10-21 DIAGNOSIS — G8929 Other chronic pain: Secondary | ICD-10-CM

## 2022-10-21 DIAGNOSIS — M19012 Primary osteoarthritis, left shoulder: Secondary | ICD-10-CM

## 2022-10-21 MED ORDER — METHYLPREDNISOLONE ACETATE 40 MG/ML IJ SUSP
40.0000 mg | INTRAMUSCULAR | Status: AC | PRN
  Administered 2022-10-21: 40 mg via INTRA_ARTICULAR

## 2022-10-21 MED ORDER — LIDOCAINE HCL 1 % IJ SOLN
2.0000 mL | INTRAMUSCULAR | Status: AC | PRN
  Administered 2022-10-21: 2 mL

## 2022-10-21 MED ORDER — BUPIVACAINE HCL 0.25 % IJ SOLN
2.0000 mL | INTRAMUSCULAR | Status: AC | PRN
  Administered 2022-10-21: 2 mL via INTRA_ARTICULAR

## 2022-10-21 NOTE — Progress Notes (Signed)
Doing much better;  States she is unsure if it was the injection or PT that has relieved the pain, but she is doing better

## 2022-10-21 NOTE — Progress Notes (Signed)
Kathleen [name] - 77 y.o. female MRN [id number]  Date of birth: 01-Jun-1946  Office Visit Note: Visit Date: 10/21/2022 PCP: Kathleen Grandchild, MD Referred by: Kathleen Grandchild, MD  Subjective: Chief Complaint  Patient presents with   Right Shoulder - Follow-up   Left Shoulder - Follow-up   HPI: Kathleen [name] is a pleasant 77 y.o. female who presents today for bilateral shoulder pain.  We did perform right shoulder injection on 08/13/2022 ([facility]) and did get her started in formalized physical therapy for both of her shoulders.  She does note she has made good improvements and had pain relief from the injection as well as improve range of motion of the right shoulder with physical therapy.  She is doing physical therapy twice weekly.  She has discontinued the tramadol and has transition to gabapentin 100 mg nightly.  Carpal tunnel symptoms are at bay.  She is not a surgical candidate given her degree of congestive heart failure as well as COPD, she is chronically on nasal cannula 3-4 L.  Is taken Flexeril 5 mg as well as Tylenol for pain control.   Pertinent ROS were reviewed with the patient and found to be negative unless otherwise specified above in HPI.   Assessment & Plan: Visit Diagnoses:  1. Primary osteoarthritis of shoulders, bilateral   2. Chronic left shoulder pain   3. Rotator cuff arthropathy, left   4. Bilateral carpal tunnel syndrome   5. Neuropathy involving both lower extremities    Plan: Discussed with Kathleen [name] that I am glad that both of her shoulders are finding improvement from the right shoulder injection as well as starting formalized physical therapy.  She does have advanced osteoarthritis of the right shoulder so we will try to maximize range of motion and function.  In terms of her left shoulder, she has more impingement and catching symptoms that are suggestive of rotator cuff tearing or arthropathy, through shared decision making elected to proceed with subacromial  joint injection.  She may use ice, Tylenol for any postinjection pain.  I would like her to rest for the next 24 to 48 hours, but then may resume all activity and continue physical therapy for both of her shoulders.  She will continue gabapentin 100 mg nightly, she is having this medication prescribed by neurology, may suggest increasing this slowly to see if this starts helping with her nerve related symptoms and lower extremity neuropathy.  We will see her back in about 6 weeks to reevaluate the shoulders and see how she is doing.  Follow-up: Return in about 6 weeks (around 12/02/2022) for bil shoulders.   Meds & Orders: No orders of the defined types were placed in this encounter.   Orders Placed This Encounter  Procedures   Large Joint Inj: L subacromial bursa     Procedures: Large Joint Inj: L subacromial bursa on 10/21/2022 11:15 AM Indications: pain Details: 22 G 1.5 in needle, posterior approach Medications: 2 mL lidocaine 1 %; 2 mL bupivacaine 0.25 %; 40 mg methylPREDNISolone acetate 40 MG/ML Outcome: tolerated well, no immediate complications  Subacromial Joint Injection, Left Shoulder After discussion on risks/benefits/indications, informed verbal consent was obtained. A timeout was then performed. Patient was seated on table in exam room. The patient's shoulder was prepped with betadine and alcohol swabs and utilizing posterior approach a 22G, 1.5" needle was directed anteriorly and laterally into the patient's subacromial space was injected with 2:2:1 mixture of lidocaine:bupivicaine:depomedrol with appreciation of free-flowing of the  injectate into the bursal space. Patient tolerated the procedure well without immediate complications.   Procedure, treatment alternatives, risks and benefits explained, specific risks discussed. Consent was given by the patient. Immediately prior to procedure a time out was called to verify the correct patient, procedure, equipment, support staff and  site/side marked as required. Patient was prepped and draped in the usual sterile fashion.          Clinical History: No specialty comments available.  She reports that she quit smoking about 35 years ago. Her smoking use included cigarettes. She has a 20.00 pack-year smoking history. She has been exposed to tobacco smoke. She has never used smokeless tobacco.  Recent Labs    01/19/22 1110 04/30/22 0158  HGBA1C  --  5.8*  LABURIC 5.4  --     Objective:   Vital Signs: There were no vitals taken for this visit.  Physical Exam  Gen: Well-appearing, in no acute distress; non-toxic CV: Well-perfused. Warm.  Resp: Breathing unlabored in conversation on nasal canula; no wheezing. Psych: Fluid speech in conversation; appropriate affect; normal thought process Neuro: Sensation intact throughout. No gross coordination deficits.   Ortho Exam - Right shoulder: No specific bony TTP.  There is crepitus taking the shoulder through range of motion.  She is actively able to get the shoulder up to approximately 100 degrees today.  - Left shoulder: There is full active and passive range of motion although painful drop arm test, positive empty can and resisted ER.  There is no significant weakness but there is pain with rotator cuff testing.  Imaging: No results found.  Past Medical/Family/Surgical/Social History: Medications & Allergies reviewed per EMR, new medications updated. 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   Depression, major, single episode, moderate (HCC) 04/27/2022   Non-STEMI (non-ST  elevated myocardial infarction) (HCC) 03/18/2022   Hypotension 03/18/2022   Morbid obesity (HCC) 03/18/2022   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   Low TSH level 12/02/2021   Acute idiopathic gout of left hand 11/19/2021   Acquired hypothyroidism 11/12/2021   Encounter for palliative care involving management of pain 11/12/2021   Post concussive syndrome 11/11/2021   Chronic left-sided low back pain 11/04/2021   Gout 10/01/2021   Hyperlipidemia with target LDL less than 100 08/07/2021   Chronic anticoagulation 07/07/2021   Stage 3a chronic kidney disease (HCC) 05/23/2021   Dietary iron deficiency 05/23/2021   Age-related osteoporosis without current pathological fracture 04/22/2021   Type 2 diabetes mellitus with diabetic neuropathy, without long-term current use of insulin (HCC) 04/22/2021   Sleep apnea 01/22/2021   Statin intolerance 10/12/2019   Statin myopathy 02/24/2019   Chronic systolic heart failure (HCC) 04/06/2018   HTN (hypertension) 04/06/2018   Atrial fibrillation (HCC) 04/06/2018   Chronic respiratory failure with hypoxia (HCC) 03/28/2018   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 12/02/2015   Malnutrition of moderate degree 09/11/2015   Past Medical History:  Diagnosis Date   Asthma    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HCC)    CHF (congestive heart failure) (HCC)    Chronic renal insufficiency    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    Concussion 10/04/2021   COPD (chronic obstructive pulmonary disease) (HCC)    Gout    Hypertension    Longstanding persistent atrial fibrillation (HCC)    on Xarelto   Nonischemic cardiomyopathy (HCC)    Obesity    Pacemaker    Spontaneous pneumothorax 2013   Family History  Problem Relation Age of Onset   Breast cancer Mother 71       3 different times   Pancreatic cancer Mother    Emphysema Father    Healthy Brother    Breast cancer Cousin    Valvular heart disease  Son    Obesity Daughter    Colon cancer Neg Hx    Esophageal cancer Neg Hx    Stomach cancer Neg Hx    Inflammatory bowel disease Neg Hx    Liver disease Neg Hx    Rectal cancer Neg Hx    Past Surgical History:  Procedure Laterality Date   ABDOMINAL HYSTERECTOMY     APPENDECTOMY     ATRIAL FIBRILLATION ABLATION  07/20/2013   by Dr Christin Fudge   AV nodal ablation  11/01/2013   by Dr Christin Fudge, repeated by Dr Wilford Grist   BREAST BIOPSY Bilateral 1997   negative   CARDIAC CATHETERIZATION     CHOLECYSTECTOMY     HERNIA REPAIR     PACEMAKER INSERTION  06/2017   MDT Viva CRT-P implanted by Dr Christin Fudge after AV nodal ablation,  LV lead could not be placed   PACEMAKER INSERTION  05/2020   with lead bundle   RIGHT/LEFT HEART CATH AND CORONARY ANGIOGRAPHY N/A 03/20/2022   Procedure: RIGHT/LEFT HEART CATH AND CORONARY ANGIOGRAPHY;  Surgeon: [place], Peter M, MD;  Location: [facility] INVASIVE CV LAB;  Service: Cardiovascular;  Laterality: N/A;   Social History   Occupational History   Occupation: retired  Tobacco Use   Smoking status: Former    Packs/day: 1.00    Years: 20.00    Additional pack years: 0.00    Total pack years: 20.00    Types: Cigarettes    Quit date: 06/09/1987    Years since quitting: 35.3    Passive exposure: Past   Smokeless tobacco: Never   Tobacco comments:    Former smoker 12/25/21  Vaping Use   Vaping Use: Never used  Substance and Sexual Activity   Alcohol use: No    Alcohol/week: 0.0 standard drinks of alcohol   Drug use: No   Sexual activity: Not Currently

## 2022-10-22 ENCOUNTER — Non-Acute Institutional Stay: Payer: Medicare Other | Admitting: Family Medicine

## 2022-10-22 ENCOUNTER — Encounter: Payer: Self-pay | Admitting: Family Medicine

## 2022-10-22 VITALS — HR 71 | Temp 98.6°F | Resp 20

## 2022-10-22 DIAGNOSIS — I5033 Acute on chronic diastolic (congestive) heart failure: Secondary | ICD-10-CM

## 2022-10-22 DIAGNOSIS — J441 Chronic obstructive pulmonary disease with (acute) exacerbation: Secondary | ICD-10-CM

## 2022-10-22 DIAGNOSIS — K5904 Chronic idiopathic constipation: Secondary | ICD-10-CM

## 2022-10-22 DIAGNOSIS — J9611 Chronic respiratory failure with hypoxia: Secondary | ICD-10-CM

## 2022-10-22 DIAGNOSIS — I480 Paroxysmal atrial fibrillation: Secondary | ICD-10-CM

## 2022-10-22 DIAGNOSIS — Z7901 Long term (current) use of anticoagulants: Secondary | ICD-10-CM

## 2022-10-22 NOTE — Progress Notes (Signed)
Therapist, nutritional Palliative Care Consult Note Telephone: [phone]  Fax: [phone]   Date of encounter: 10/22/22 2:47 PM PATIENT NAME: Kathleen [name] [address] [place] [address]   [phone] (home)  DOB: 1945-06-19 MRN: [id number] PRIMARY CARE PROVIDER:    [name], MD,  [address] [place] [address] [phone]  REFERRING PROVIDER:   [name], MD [address] [place],  [place] [address] [phone]  Emergency Contact:    Contact Information     Name Relation Home Work [place] Son [phone]  [phone]   [place], Meltzer [phone]  [phone]   Fanny Skates Daughter   [phone]        I met face to face with patient in [facility] facility. Palliative Care was asked to follow this patient by consultation request of [name], MD to address advance care planning and complex medical decision making. This is an initial/follow up visit.  CODE STATUS: Full Code    ASSESSMENT AND / RECOMMENDATIONS:  PPS: 60%  COPD with acute exacerbation/chronic          respiratory failure Resolving, completed Cefdinir and Prednisone burst. Continue current maintenance and rescue nebulizer treatments Has needed increase O2 @ 4.5 L [place]. Notify PCP if fever, increased work of breathing or if no improvement with current therapy.  2.    Chronic systolic heart failure Appears euvolemic Weight is stable at baseline. Continue ARB, beta blocker, torsemide and spironolactone with potassium replacement Continue daily weight  3.    Paroxysmal atrial fibrillation with chronic anticoagulation                                                                                          Rate controlled on beta blocker and tikosyn Anticoagulated on Xarelto No noted bleeding  4.  Chronic idiopathic constipation Exchange Senokot-S 2 tabs BID for Colace Continue Linzess  and magnesium as prescribed.  Follow up Palliative Care Visit:  Palliative Care continuing to follow up by monitoring for changes in appetite, weight, functional and cognitive status for chronic disease progression and management in agreement with patient's stated goals of care. Next visit in 4 weeks or prn.  This visit was coded based on medical decision making (MDM).  Chief Complaint  Palliative Care is continuing to follow pt for chronic medical management in setting of COPD and heart failure.  HISTORY OF PRESENT ILLNESS: Kathleen [name] is a 77 y.o. year old female with COPD and systolic heart failure, having increased dizziness today different than her normal vertigo sensation.  Says she feels like she is going to fall or pass out.  She had injection in her right shoulder yesterday. She states she took her antibiotic prescribed by this provider and for about 30 minutes was feeling lightheaded which she attributes to the antibiotic.  She reports no significant BM x 3 weeks, only small amounts. She has used her Linzess in the lower dose but had no significant improvement. She had some wheezing yesterday and earlier today but overall feels improved. Was bending over today to get something from  the floor, lost her balance but caught herself before falling. She is continuing to work with PT for her shoulder.   ACTIVITIES OF DAILY LIVING: CONTINENT OF BLADDER/BOWEL? Yes BATHING/DRESSING/FEEDING: Independent   MOBILITY:   Rollator   APPETITE? 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   Depression, major, single episode, moderate (HCC) 04/27/2022   Non-STEMI (non-ST elevated myocardial infarction) (HCC) 03/18/2022   Hypotension 03/18/2022   Morbid obesity (HCC) 03/18/2022   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   Low TSH level 12/02/2021   Acute idiopathic gout of left hand 11/19/2021   Acquired hypothyroidism 11/12/2021   Encounter for palliative care involving management of pain 11/12/2021   Post concussive syndrome 11/11/2021   Chronic left-sided low back pain 11/04/2021   Gout 10/01/2021   Hyperlipidemia with target LDL less than 100 08/07/2021   Chronic anticoagulation 07/07/2021   Stage 3a chronic kidney disease (HCC) 05/23/2021   Dietary iron deficiency 05/23/2021   Age-related osteoporosis without current pathological fracture 04/22/2021   Type 2 diabetes mellitus with diabetic neuropathy, without long-term current use of insulin (HCC) 04/22/2021   Sleep apnea 01/22/2021   Statin intolerance 10/12/2019   Statin myopathy 02/24/2019   Chronic systolic heart failure (HCC) 04/06/2018   HTN (hypertension) 04/06/2018   Atrial fibrillation (HCC) 04/06/2018   Chronic respiratory failure with hypoxia (HCC) 03/28/2018   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 12/02/2015   Malnutrition of moderate degree 09/11/2015   PAST MEDICAL HISTORY:  Active Ambulatory Problems    Diagnosis Date Noted   Malnutrition of moderate degree 09/11/2015   Chronic respiratory failure with hypoxia (HCC) 03/28/2018   Chronic systolic heart failure (HCC) 04/06/2018   HTN (hypertension) 04/06/2018   Atrial fibrillation (HCC) 04/06/2018   Sleep apnea 01/22/2021   Age-related osteoporosis without current pathological fracture 04/22/2021   Type 2 diabetes mellitus with diabetic neuropathy, without long-term current use of insulin (HCC) 04/22/2021   Stage 3a chronic kidney disease (HCC) 05/23/2021    Dietary iron deficiency 05/23/2021   Chronic anticoagulation 07/07/2021   Hyperlipidemia with target LDL less than 100 08/07/2021   Gout 10/01/2021   Chronic left-sided low back pain 11/04/2021   Post concussive syndrome 11/11/2021   Acquired hypothyroidism 11/12/2021   Encounter for palliative care involving management of pain 11/12/2021   Acute idiopathic gout of left hand 11/19/2021   Low TSH level 12/02/2021   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   Non-STEMI (non-ST elevated myocardial infarction) (HCC) 03/18/2022   Hypotension 03/18/2022   Morbid obesity (HCC) 03/18/2022   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 12/02/2015   Statin myopathy 02/24/2019   Statin intolerance 10/12/2019   Depression, major, single episode, moderate (HCC) 04/27/2022   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   Pneumonia 04/29/2022   Heart failure with reduced ejection fraction (HCC) 05/14/2022   Paranasal sinus disease 05/19/2022   Post-viral cough syndrome 06/02/2022   Chronic obstructive pulmonary disease (HCC) 06/28/2022   Palliative care encounter 06/28/2022   Pain in left shoulder 07/23/2022   COPD with acute exacerbation (HCC)  10/09/2022   Resolved Ambulatory Problems    Diagnosis Date Noted   Fever 09/08/2015   Hyponatremia 09/08/2015   Acute respiratory failure with hypoxia (HCC) 09/08/2015   Bacterial pneumonia 09/08/2015   Acute on chronic respiratory failure with hypoxia (HCC) 09/08/2015   Acute bronchitis 09/17/2015   COPD (chronic obstructive pulmonary disease) (HCC) 04/06/2018   Hypercalcemia 06/26/2019   (HFpEF) heart failure with preserved ejection fraction (HCC) 06/26/2019   Acute kidney injury superimposed on CKD (HCC) 06/26/2019   Generalized abdominal pain    Constipation    Community acquired pneumonia 11/14/2020   Chronic hyperglycemia 01/22/2021   Daytime somnolence 01/22/2021   Penicillin allergy 01/23/2021   Pneumonia of left lower lobe due to infectious  organism 04/22/2021   Chronic idiopathic constipation 04/22/2021   Flu vaccine need 04/22/2021   Cellulitis of breast 05/10/2021   Cellulitis of chest wall 05/11/2021   Sepsis (HCC) 05/11/2021   Leukocytosis 05/11/2021   Dehydration 05/11/2021   Acute prerenal azotemia 05/11/2021   Allergic contact dermatitis due to drugs in contact with skin 05/23/2021   Candidiasis of skin 05/23/2021   Deficiency anemia 05/23/2021   Acute gout due to renal impairment involving foot 05/26/2021   Dysphagia 07/07/2021   Iron deficiency 07/07/2021   Abnormal CT scan, colon 07/07/2021   History of colon polyps 07/07/2021   Cough productive of purulent sputum 08/07/2021   LRTI (lower respiratory tract infection) 08/07/2021   Subarachnoid hemorrhage (HCC) 10/04/2021   Subarachnoid bleed (HCC) 10/04/2021   Blood in stool, frank 11/11/2021   Acute cough 11/19/2021   Left hand pain 11/19/2021   Acute conjunctivitis of right eye 03/03/2022   Severe sepsis (HCC) 03/17/2022   Acute on chronic combined systolic and diastolic CHF (congestive heart failure) (HCC) 03/19/2022   Hospital discharge follow-up 03/25/2022   History of severe sepsis 03/25/2022   CAP (community acquired pneumonia) 04/21/2022   RSV infection 05/14/2022   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    Concussion 10/04/2021   Hypertension    Longstanding persistent atrial fibrillation (HCC)    Nonischemic cardiomyopathy (HCC)    Obesity    Pacemaker    Spontaneous pneumothorax 2013   SOCIAL HX:  Social History   Tobacco Use   Smoking status: Former    Packs/day: 1.00    Years: 20.00    Additional pack years: 0.00    Total pack years: 20.00    Types: Cigarettes    Quit date: 06/09/1987    Years since quitting: 35.3    Passive  exposure: Past   Smokeless tobacco: Never   Tobacco comments:    Former smoker 12/25/21  Substance Use Topics   Alcohol use: No    Alcohol/week: 0.0 standard drinks of alcohol   FAMILY HX:  Family History  Problem Relation Age of Onset   Breast cancer Mother 75       3 different times   Pancreatic cancer Mother    Emphysema Father    Healthy Brother    Breast cancer Cousin    Valvular heart disease Son    Obesity Daughter    Colon cancer Neg Hx    Esophageal cancer Neg Hx    Stomach cancer Neg Hx    Inflammatory bowel disease Neg Hx  Liver disease Neg Hx    Rectal cancer Neg Hx        Preferred Pharmacy: ALLERGIES:  Allergies  Allergen Reactions   Allopurinol Other (See Comments)    Reaction:  Dizziness    Clindamycin Anaphylaxis and Hives   Flublok [Influenza Vaccine Recombinant] Other (See Comments)    Fever 103 with no alternative explanation day after vaccine.  Clydie Braun Shaheed Schmuck FNP-C   Pneumococcal 13-Val Conj Vacc Itching, Swelling and Rash   Dronedarone Rash   Brovana [Arformoterol]     Caused muscle pain   Budesonide     Caused extreme joint pain   Entresto [Sacubitril-Valsartan] Other (See Comments)    hypotension   Fosamax [Alendronate Sodium] Nausea Only   Jardiance [Empagliflozin]     Caused a vaginal infection   Meperidine Nausea And Vomiting   Microplegia Msa-Msg [Plegisol]     Loopy,diarrhea   Rosuvastatin Other (See Comments)    Reaction:  Muscle spasms    Tetracycline Hives   Adhesive [Tape] Rash   Lovastatin Rash and Other (See Comments)    Muscle Pain     PERTINENT MEDICATIONS:  Outpatient Encounter Medications as of 10/22/2022  Medication Sig   acetaminophen (TYLENOL) 650 MG CR tablet Take 1,300 mg by mouth every 8 (eight) hours as needed for pain.   albuterol (PROVENTIL) (2.5 MG/3ML) 0.083% nebulizer solution Take 2.5 mg by nebulization every 6 (six) hours as needed for wheezing or shortness of breath.   albuterol (VENTOLIN HFA) 108  (90 Base) MCG/ACT inhaler Inhale 2 puffs into the lungs every 4 (four) hours as needed for wheezing or shortness of breath.   azelastine (ASTELIN) 0.1 % nasal spray USE 2 SPRAYS IN EACH NOSTRIL TWICE DAILY   Cholecalciferol (VITAMIN D3) 50 MCG (2000 UT) TABS Take 2,000 Units by mouth at bedtime.   cyanocobalamin (VITAMIN B12) 1000 MCG/ML injection Inject 1 mL (1,000 mcg total) into the muscle every 30 (thirty) days.   cyclobenzaprine (FLEXERIL) 5 MG tablet TAKE 1 TABLET BY MOUTH AT BEDTIME AS NEEDED FOR LATERAL HIP PAIN   docusate sodium (COLACE) 100 MG capsule Take 100 mg by mouth 2 (two) times daily.   dofetilide (TIKOSYN) 250 MCG capsule Take 1 capsule (250 mcg total) by mouth in the morning and at bedtime.   febuxostat (ULORIC) 40 MG tablet Take 1 tablet (40 mg total) by mouth daily.   fluticasone furoate-vilanterol (BREO ELLIPTA) 100-25 MCG/ACT AEPB Inhale 1 puff into the lungs daily.   gabapentin (NEURONTIN) 100 MG capsule Take 1 capsule (100 mg total) by mouth at bedtime.   guaiFENesin (MUCINEX) 600 MG 12 hr tablet Take 600 mg by mouth 2 (two) times daily.   linaclotide (LINZESS) 145 MCG CAPS capsule Take 1 capsule (145 mcg total) by mouth daily as needed (constipation).   losartan (COZAAR) 25 MG tablet TAKE 1 TABLET(25 MG) BY MOUTH AT BEDTIME   Magnesium 500 MG TABS Take 500 mg by mouth at bedtime.   NEXLETOL 180 MG TABS TAKE 1 TABLET BY MOUTH DAILY   nystatin cream (MYCOSTATIN) Apply to affected area 2 times daily   OXYGEN Inhale 4 L into the lungs continuous. Use with resmed ventilator   potassium chloride (KLOR-CON) 10 MEQ tablet Take 1 tablet (10 mEq total) by mouth daily.   revefenacin (YUPELRI) 175 MCG/3ML nebulizer solution Take 3 mLs (175 mcg total) by nebulization daily.   spironolactone (ALDACTONE) 25 MG tablet TAKE 1 TABLET(25 MG) BY MOUTH EVERY MORNING   torsemide (DEMADEX) 20 MG  tablet Take 1 tablet (20 mg total) by mouth daily.   traMADol (ULTRAM) 50 MG tablet Take 1 tablet  (50 mg total) by mouth every 6 (six) hours as needed.   XARELTO 20 MG TABS tablet TAKE 1 TABLET(20 MG) BY MOUTH DAILY WITH SUPPER   No facility-administered encounter medications on file as of 10/22/2022.    History obtained from review of EMR, discussion with patient.   CBC    Component Value Date/Time   WBC 10.0 09/15/2022 1134   RBC 4.86 09/15/2022 1134   HGB 12.9 09/15/2022 1134   HGB 13.4 12/28/2012 0748   HCT 40.8 09/15/2022 1134   HCT 39.2 12/28/2012 0748   PLT 274 09/15/2022 1134   PLT 186 12/28/2012 0748   MCV 84.0 09/15/2022 1134   MCV 87 12/28/2012 0748   MCH 26.5 09/15/2022 1134   MCHC 31.6 09/15/2022 1134   RDW 13.8 09/15/2022 1134   RDW 15.0 (H) 12/28/2012 0748   LYMPHSABS 1.6 05/22/2022 0901   LYMPHSABS 0.4 (L) 12/28/2012 0748   MONOABS 0.7 05/22/2022 0901   MONOABS 0.5 12/28/2012 0748   EOSABS 0.1 05/22/2022 0901   EOSABS 0.0 12/28/2012 0748   BASOSABS 0.1 05/22/2022 0901   BASOSABS 0.0 12/28/2012 0748    CMP    Latest Ref Rng & Units 09/15/2022   11:34 AM 08/03/2022   11:03 AM 05/19/2022   12:11 PM  CMP  Glucose 70 - 99 mg/dL 99  161  096   BUN 8 - 23 mg/dL 19  22  16    Creatinine 0.44 - 1.00 mg/dL 0.45  4.09  8.11   Sodium 135 - 145 mmol/L 139  138  140   Potassium 3.5 - 5.1 mmol/L 4.3  3.8  4.5   Chloride 98 - 111 mmol/L 98  98  103   CO2 22 - 32 mmol/L 29  28  28    Calcium 8.9 - 10.3 mg/dL 91.4  9.5  9.8      I reviewed available labs, medications, imaging, studies and related documents from the EMR.  Records reviewed and summarized above.   Physical Exam: GENERAL: NAD LUNGS: Diminished breath sounds in RLL, otherwise clear, no increased work of breathing, on 4.5 L CARDIAC:  S1S2, RRR with lusb murmur, No edema/cyanosis ABD:  Hypo-active BS x 4 quads, mod distended soft, non-tender EXTREMITIES: Normal ROM, no deformity, strength equal, No muscle atrophy/subcutaneous fat loss NEURO:  No weakness or cognitive impairment PSYCH:  non-anxious  affect, A & O x 3  Thank you for the opportunity to participate in the care of Kathleen [name]. Please call our main office at [phone] if we can be of additional assistance.    Joycelyn Man FNP-C  [facility]  Phone:  [phone]

## 2022-10-26 DIAGNOSIS — I5033 Acute on chronic diastolic (congestive) heart failure: Secondary | ICD-10-CM | POA: Insufficient documentation

## 2022-10-27 NOTE — Progress Notes (Signed)
Remote pacemaker transmission.   

## 2022-11-03 ENCOUNTER — Other Ambulatory Visit: Payer: Self-pay | Admitting: Neurology

## 2022-11-03 ENCOUNTER — Encounter: Payer: Self-pay | Admitting: Neurology

## 2022-11-03 MED ORDER — GABAPENTIN 100 MG PO CAPS: 200.0000 mg | ORAL_CAPSULE | Freq: Every day | ORAL | 6 refills | Status: DC

## 2022-11-03 NOTE — Telephone Encounter (Signed)
Done. Gabapentin increased to 200 mg nightly

## 2022-11-05 ENCOUNTER — Encounter (HOSPITAL_BASED_OUTPATIENT_CLINIC_OR_DEPARTMENT_OTHER): Payer: Self-pay | Admitting: Pulmonary Disease

## 2022-11-05 ENCOUNTER — Ambulatory Visit (INDEPENDENT_AMBULATORY_CARE_PROVIDER_SITE_OTHER): Payer: Medicare Other | Admitting: Pulmonary Disease

## 2022-11-05 ENCOUNTER — Telehealth (HOSPITAL_BASED_OUTPATIENT_CLINIC_OR_DEPARTMENT_OTHER): Payer: Self-pay | Admitting: Pulmonary Disease

## 2022-11-05 VITALS — BP 116/68 | HR 98 | Temp 98.4°F | Ht 60.0 in | Wt 191.4 lb

## 2022-11-05 DIAGNOSIS — J398 Other specified diseases of upper respiratory tract: Secondary | ICD-10-CM | POA: Diagnosis not present

## 2022-11-05 DIAGNOSIS — J9611 Chronic respiratory failure with hypoxia: Secondary | ICD-10-CM

## 2022-11-05 DIAGNOSIS — I5022 Chronic systolic (congestive) heart failure: Secondary | ICD-10-CM

## 2022-11-05 DIAGNOSIS — J4489 Other specified chronic obstructive pulmonary disease: Secondary | ICD-10-CM | POA: Diagnosis not present

## 2022-11-05 MED ORDER — AMOXICILLIN-POT CLAVULANATE 875-125 MG PO TABS: 1.0000 | ORAL_TABLET | Freq: Two times a day (BID) | ORAL | 0 refills | Status: DC

## 2022-11-05 MED ORDER — AZELASTINE HCL 0.1 % NA SOLN: 2.0000 | Freq: Two times a day (BID) | NASAL | 0 refills | Status: DC

## 2022-11-05 MED ORDER — YUPELRI 175 MCG/3ML IN SOLN: 175.0000 ug | Freq: Every day | RESPIRATORY_TRACT | 11 refills | Status: DC

## 2022-11-05 NOTE — Patient Instructions (Signed)
BiPAP is working well on current settings  X Rx for Augmentin 875 mg twice daily for 7 days  x refills on azelastine and Yupelri

## 2022-11-05 NOTE — Assessment & Plan Note (Signed)
She has gained 6 pounds within this week from her baseline weight of 185. I encouraged her to take an extra dose of torsemide

## 2022-11-05 NOTE — Assessment & Plan Note (Addendum)
Maintained on BiPAP during sleep 12/8 cm. Reviewed download which shows good control of events.  Machine is working well she is very compliant Hopeful that this will prevent hospitalization.  She has not had a hospital visit for the past 6 months  Weight loss encouraged, compliance with goal of at least 4-6 hrs every night is the expectation. Advised against medications with sedative side effects Cautioned against driving when sleepy - understanding that sleepiness will vary on a day to day basis

## 2022-11-05 NOTE — Telephone Encounter (Signed)
Patient states Kathleen [name] is called in through [facility] instead of [facility] because of the price difference. Sending as an Financial planner. Patient was just called about a refill two days ago so she fine and will call if she has issues in the future.

## 2022-11-05 NOTE — Progress Notes (Signed)
Subjective:    Patient ID: Kathleen [name], female    DOB: 03/25/1946, 77 y.o.   MRN: [id number]  HPI  77 yo woman, ex-smoker with asthma/COPD overlap syndrome, tracheobronchomalacia and chronic hypoxic respiratory failure  -She uses 3 L pulse at rest and 4 L on ambulation   Asthma onset in 20s She was previously followed at [facility], evaluated by Dr. Renaee Munda at [facility] interventional pulmonary and felt not to be a candidate for stent placement for tracheobronchomalacia     PMH - BOOP in 2013 HFrEF,  persistent AF s/p AVN ablation and pacemaker implant 2017 (has failed upgrade to BiV), on Tikosyn -left nasal bleeds  Desensitized to penicillin   Meds -She was switched from an inhaler regimen in 2022 to budesonide/Brovana and Yupelri combination -did not tolerate budesonide/Brovana due to joint pains - did not tolerate symbicort   Last hospitalization 11/2020 for pneumonia, respiratory failure hospitalized 03/2022 for sepsis  related to pneumonia, CTA was negative for PE or acute airspace disease. admitted from 04/2022 for acute on chronic respiratory failure secondary to RSV pneumonia/Boop? >> prolonged steroid taper upon discharge.     Chief Complaint  Patient presents with   Follow-up    Follow up.    23-month follow-up visit. After a lot of back-and-forth with [facility] she was finally able to obtain a BiPAP machine.  She has been using this for 2 weeks.  She also requested a replacement mask due to exposure to smoke and is still waiting on this. Last office visit we tapered prednisone to off.  She is maintained on regimen of Breo and Yupelri.  This is working well.  She is followed by palliative care at home and was given course of cefdinir and prednisone a month ago she now reports cough with yellow sputum production and occasional wheezing which is relieved by nebulizer. She needs refills on azelastine She has gained weight up to 191 pounds and plans to take an extra dose of torsemide  next week  All previous communications with patient and her son were reviewed since last visit    Significant tests/ events reviewed BiPAP titration study 01/2021 12/8 cm   ABG 03/2022 7.3 4/33/87 04/2022 CT sinuses diffuse paranasal sinus disease  03/2022 CTA chest neg  08/2021 CT chest without contrast emphysema, stable nodules   HRCT 01/2021 emphysema, tracheobronchomalacia , bland scarring both bases , new atelectasis/consolidation of the lingula with scattered groundglass in left upper lobe   01/2020  With exertion her lowest oxygen saturation was 92% on room air. Her peak HR was 103. She walked a total of 22m which was 25% predicted     Cleda Daub 01/2021 severe airway obstruction, ratio 59, FEV1 0.95/48%, FVC 1.61/64%  Review of Systems neg for any significant sore throat, dysphagia, itching, sneezing, nasal congestion or excess/ purulent secretions, fever, chills, sweats, unintended wt loss, pleuritic or exertional cp, hempoptysis, orthopnea pnd or change in chronic leg swelling. Also denies presyncope, palpitations, heartburn, abdominal pain, nausea, vomiting, diarrhea or change in bowel or urinary habits, dysuria,hematuria, rash, arthralgias, visual complaints, headache, numbness weakness or ataxia.     Objective:   Physical Exam  Gen. Pleasant, obese, in no distress, normal affect ENT - no pallor,icterus, no post nasal drip, class 2-3 airway Neck: No JVD, no thyromegaly, no carotid bruits Lungs: no use of accessory muscles, no dullness to percussion, decreased without rales or rhonchi  Cardiovascular: Rhythm regular, heart sounds  normal, no murmurs or gallops, 1+ peripheral edema  Abdomen: soft and non-tender, no hepatosplenomegaly, BS normal. Musculoskeletal: No deformities, no cyanosis or clubbing Neuro:  alert, non focal, no tremors        Assessment & Plan:

## 2022-11-05 NOTE — Assessment & Plan Note (Signed)
Current regimen of Breo and Yupelri nebs is working well with albuterol for breakthrough. We will treat her for an acute bronchitis given yellow sputum production with Augmentin for 7 days. She has been desensitized to penicillin Refills to be provided on azelastine and Mikael Spray

## 2022-11-05 NOTE — Assessment & Plan Note (Signed)
Continue 4 L of oxygen, maintain saturation 90% and above

## 2022-11-11 ENCOUNTER — Other Ambulatory Visit: Payer: Self-pay | Admitting: Internal Medicine

## 2022-11-11 DIAGNOSIS — E785 Hyperlipidemia, unspecified: Secondary | ICD-10-CM

## 2022-11-16 ENCOUNTER — Non-Acute Institutional Stay: Payer: Medicare Other | Admitting: Family Medicine

## 2022-11-16 ENCOUNTER — Ambulatory Visit: Payer: Medicare Other | Attending: Cardiology

## 2022-11-16 VITALS — HR 74 | Temp 97.9°F | Resp 20

## 2022-11-16 DIAGNOSIS — I482 Chronic atrial fibrillation, unspecified: Secondary | ICD-10-CM

## 2022-11-16 DIAGNOSIS — I5022 Chronic systolic (congestive) heart failure: Secondary | ICD-10-CM | POA: Diagnosis not present

## 2022-11-16 DIAGNOSIS — Z950 Presence of cardiac pacemaker: Secondary | ICD-10-CM

## 2022-11-16 DIAGNOSIS — J9611 Chronic respiratory failure with hypoxia: Secondary | ICD-10-CM

## 2022-11-16 DIAGNOSIS — J449 Chronic obstructive pulmonary disease, unspecified: Secondary | ICD-10-CM

## 2022-11-16 NOTE — Progress Notes (Signed)
Therapist, nutritional Palliative Care Consult Note Telephone: [phone]  Fax: [phone]   Date of encounter: 11/16/22 2:47 PM PATIENT NAME: Kathleen [name] [address] [place] [address]   [phone] (home)  DOB: 1945-11-12 MRN: [id number] PRIMARY CARE PROVIDER:    [name], MD,  [address] [place] [address] [phone]  REFERRING PROVIDER:   [name], MD [address] [place],  [place] [address] [phone]  Emergency Contact:    Contact Information     Name Relation Home Work [place] Son [phone]  [phone]   [place]ja, Siuda [phone]  [phone]   Fanny Skates Daughter   [phone]        I met face to face with patient in [facility] facility. Palliative Care was asked to follow this patient by consultation request of [name], MD to address advance care planning and complex medical decision making. This is a follow up visit.  CODE STATUS: Full Code    ASSESSMENT AND / RECOMMENDATIONS:  PPS: 60%   Chronic systolic heart failure Recent significant weight gain. Symptomatic with dyspnea and edema.  Advised increase in torsemide BID x 3 days then resume normal schedule. Continue Cozaar 25 mg at bedtime and Spironolactone, Nexletol for rhythm control.  2.  Chronic atrial fibrillation Has pacemaker in place. Continue Nexletol and Tikosyn for rhythm control , rate controlled at present. Has been off anticoagulation since severe fall with head bleed > 1 year ago. Continue O2 and Bi-Pap.  3.  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 Has just completed a 2nd round of antibiotics. Currently no increased sputum production/wheezing or fever. Likely would not benefit from repeat antibiotic therapy currently. Advised to treat for fluid retention with  diuretics/potassium, continue daily weights.  Will follow up in 7-10 days.  Call for worsening.   Follow up Palliative Care Visit:  Palliative Care continuing to follow up by monitoring for changes in appetite, weight, functional and cognitive status for chronic disease progression and management in agreement with patient's stated goals of care. Next visit in 7-10 days or prn.  This visit was coded based on medical decision making (MDM).  Chief Complaint  Palliative Care is continuing to follow pt for chronic medical management in setting of COPD and heart failure.  HISTORY OF PRESENT ILLNESS: Kathleen [name] is a 77 y.o. year old female with COPD and systolic heart failure, having increased dizziness today different than her normal vertigo sensation.   She states she has a feeling of not being strong enough to walk around.  She has gone shopping with her daughter-in-law and was tired but enjoyed the day.  She states she noted no significant improvement with Cefdinir.  She has continued to have significant SOB, had completed the 5-7 days of Prednisone 40 mg with no significant improvement.  She states having some left over Prednisone which she took.  She is doing PT for her shoulders.  She saw Dr Vassie Loll her Pulmonologist who gave her a course of Augmentin 7 days.  She gained from 185 to 191 lbs and took an extra Torsemide.  Her BM has improved.  She has been using her BiPap on a setting of 12/8 cm doing well.  States having a bad day today and woke up wheezing severely this am. Denies PND, continues to cough up thick, light yellow sputum.  Her O2 is 4L min.  She  states when she first gets up she feels less well.    ACTIVITIES OF DAILY LIVING: CONTINENT OF BLADDER/BOWEL? Yes BATHING/DRESSING/FEEDING: Independent   MOBILITY:   Rollator   APPETITE? 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   Depression, major, single episode, moderate (HCC) 04/27/2022   Non-STEMI (non-ST elevated myocardial infarction) (HCC) 03/18/2022   Hypotension 03/18/2022   Morbid obesity (HCC) 03/18/2022   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   Low TSH level 12/02/2021   Acute idiopathic gout of left hand 11/19/2021   Acquired hypothyroidism 11/12/2021   Encounter for palliative care involving management of pain 11/12/2021   Post concussive syndrome 11/11/2021   Chronic left-sided low back pain 11/04/2021   Gout 10/01/2021   Hyperlipidemia with target LDL less than 100 08/07/2021   Chronic anticoagulation 07/07/2021   Stage 3a chronic kidney disease (HCC) 05/23/2021   Dietary iron deficiency 05/23/2021   Age-related osteoporosis without current pathological fracture 04/22/2021   Type 2 diabetes mellitus with diabetic neuropathy, without long-term current use of insulin (HCC) 04/22/2021   Sleep apnea 01/22/2021   Statin intolerance 10/12/2019   Statin myopathy 02/24/2019   Chronic systolic heart failure (HCC) 04/06/2018   HTN (hypertension) 04/06/2018   Atrial fibrillation (HCC) 04/06/2018   Chronic respiratory failure with hypoxia (HCC) 03/28/2018   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 12/02/2015   Malnutrition of moderate degree 09/11/2015   PAST MEDICAL HISTORY:  Active Ambulatory Problems    Diagnosis Date Noted   Malnutrition of moderate degree 09/11/2015   Chronic respiratory failure with hypoxia (HCC) 03/28/2018   Chronic systolic heart failure  (HCC) 16/03/9603   HTN (hypertension) 04/06/2018   Atrial fibrillation (HCC) 04/06/2018   Sleep apnea 01/22/2021   Age-related osteoporosis without current pathological fracture 04/22/2021   Type 2 diabetes mellitus with diabetic neuropathy, without long-term current use of insulin (HCC) 04/22/2021   Stage 3a chronic kidney disease (HCC) 05/23/2021   Dietary iron deficiency 05/23/2021   Chronic anticoagulation 07/07/2021   Hyperlipidemia with target LDL less than 100 08/07/2021   Gout 10/01/2021   Chronic left-sided low back pain 11/04/2021   Post concussive syndrome 11/11/2021   Acquired hypothyroidism 11/12/2021   Encounter for palliative care involving management of pain 11/12/2021   Acute idiopathic gout of left hand 11/19/2021   Low TSH level 12/02/2021   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   Non-STEMI (non-ST elevated myocardial infarction) (HCC) 03/18/2022   Hypotension 03/18/2022   Morbid obesity (HCC) 03/18/2022   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 12/02/2015   Statin myopathy 02/24/2019   Statin intolerance 10/12/2019   Depression, major, single episode, moderate (HCC) 04/27/2022   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   Pneumonia 04/29/2022   Heart failure with reduced ejection fraction (HCC) 05/14/2022   Paranasal sinus disease 05/19/2022   Post-viral cough syndrome 06/02/2022   Chronic obstructive pulmonary disease (HCC) 06/28/2022   Palliative care encounter 06/28/2022   Pain in left shoulder 07/23/2022   COPD with acute exacerbation (HCC) 10/09/2022  Acute on chronic heart failure with preserved ejection fraction (HCC) 10/26/2022   Resolved Ambulatory Problems    Diagnosis Date Noted   Fever 09/08/2015   Hyponatremia 09/08/2015   Acute respiratory failure with hypoxia (HCC) 09/08/2015   Bacterial pneumonia 09/08/2015   Acute on chronic respiratory failure with hypoxia (HCC) 09/08/2015   Acute bronchitis 09/17/2015   COPD (chronic obstructive  pulmonary disease) (HCC) 04/06/2018   Hypercalcemia 06/26/2019   (HFpEF) heart failure with preserved ejection fraction (HCC) 06/26/2019   Acute kidney injury superimposed on CKD (HCC) 06/26/2019   Generalized abdominal pain    Constipation    Community acquired pneumonia 11/14/2020   Chronic hyperglycemia 01/22/2021   Daytime somnolence 01/22/2021   Penicillin allergy 01/23/2021   Pneumonia of left lower lobe due to infectious organism 04/22/2021   Chronic idiopathic constipation 04/22/2021   Flu vaccine need 04/22/2021   Cellulitis of breast 05/10/2021   Cellulitis of chest wall 05/11/2021   Sepsis (HCC) 05/11/2021   Leukocytosis 05/11/2021   Dehydration 05/11/2021   Acute prerenal azotemia 05/11/2021   Allergic contact dermatitis due to drugs in contact with skin 05/23/2021   Candidiasis of skin 05/23/2021   Deficiency anemia 05/23/2021   Acute gout due to renal impairment involving foot 05/26/2021   Dysphagia 07/07/2021   Iron deficiency 07/07/2021   Abnormal CT scan, colon 07/07/2021   History of colon polyps 07/07/2021   Cough productive of purulent sputum 08/07/2021   LRTI (lower respiratory tract infection) 08/07/2021   Subarachnoid hemorrhage (HCC) 10/04/2021   Subarachnoid bleed (HCC) 10/04/2021   Blood in stool, frank 11/11/2021   Acute cough 11/19/2021   Left hand pain 11/19/2021   Acute conjunctivitis of right eye 03/03/2022   Severe sepsis (HCC) 03/17/2022   Acute on chronic combined systolic and diastolic CHF (congestive heart failure) (HCC) 03/19/2022   Hospital discharge follow-up 03/25/2022   History of severe sepsis 03/25/2022   CAP (community acquired pneumonia) 04/21/2022   RSV infection 05/14/2022   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    Concussion  10/04/2021   Hypertension    Longstanding persistent atrial fibrillation (HCC)    Nonischemic cardiomyopathy (HCC)    Obesity    Pacemaker    Spontaneous pneumothorax 2013   SOCIAL HX:  Social History   Tobacco Use   Smoking status: Former    Packs/day: 1.00    Years: 20.00    Additional pack years: 0.00    Total pack years: 20.00    Types: Cigarettes    Quit date: 06/09/1987    Years since quitting: 35.4    Passive exposure: Past   Smokeless tobacco: Never   Tobacco comments:    Former smoker 12/25/21  Substance Use Topics   Alcohol use: No    Alcohol/week: 0.0 standard drinks of alcohol   FAMILY HX:  Family History  Problem Relation Age of Onset   Breast cancer Mother 67       3 different times   Pancreatic cancer Mother    Emphysema Father    Healthy Brother    Breast cancer Cousin    Valvular heart disease Son    Obesity Daughter    Colon cancer Neg Hx    Esophageal cancer Neg Hx    Stomach cancer  Neg Hx    Inflammatory bowel disease Neg Hx    Liver disease Neg Hx    Rectal cancer Neg Hx        Preferred Pharmacy: ALLERGIES:  Allergies  Allergen Reactions   Allopurinol Other (See Comments)    Reaction:  Dizziness    Clindamycin Anaphylaxis and Hives   Flublok [Influenza Vaccine Recombinant] Other (See Comments)    Fever 103 with no alternative explanation day after vaccine.  Clydie Braun [place] FNP-C   Pneumococcal 13-Val Conj Vacc Itching, Swelling and Rash   Dronedarone Rash   Brovana [Arformoterol]     Caused muscle pain   Budesonide     Caused extreme joint pain   Entresto [Sacubitril-Valsartan] Other (See Comments)    hypotension   Fosamax [Alendronate Sodium] Nausea Only   Jardiance [Empagliflozin]     Caused a vaginal infection   Meperidine Nausea And Vomiting   Microplegia Msa-Msg [Plegisol]     Loopy,diarrhea   Rosuvastatin Other (See Comments)    Reaction:  Muscle spasms    Tetracycline Hives   Adhesive [Tape] Rash   Lovastatin Rash  and Other (See Comments)    Muscle Pain     PERTINENT MEDICATIONS:  Outpatient Encounter Medications as of 11/16/2022  Medication Sig   acetaminophen (TYLENOL) 650 MG CR tablet Take 1,300 mg by mouth every 8 (eight) hours as needed for pain.   albuterol (PROVENTIL) (2.5 MG/3ML) 0.083% nebulizer solution Take 2.5 mg by nebulization every 6 (six) hours as needed for wheezing or shortness of breath.   albuterol (VENTOLIN HFA) 108 (90 Base) MCG/ACT inhaler Inhale 2 puffs into the lungs every 4 (four) hours as needed for wheezing or shortness of breath.   amoxicillin-clavulanate (AUGMENTIN) 875-125 MG tablet Take 1 tablet by mouth 2 (two) times daily.   azelastine (ASTELIN) 0.1 % nasal spray Place 2 sprays into both nostrils 2 (two) times daily.   Cholecalciferol (VITAMIN D3) 50 MCG (2000 UT) TABS Take 2,000 Units by mouth at bedtime.   cyanocobalamin (VITAMIN B12) 1000 MCG/ML injection Inject 1 mL (1,000 mcg total) into the muscle every 30 (thirty) days.   cyclobenzaprine (FLEXERIL) 5 MG tablet TAKE 1 TABLET BY MOUTH AT BEDTIME AS NEEDED FOR LATERAL HIP PAIN   docusate sodium (COLACE) 100 MG capsule Take 100 mg by mouth 2 (two) times daily.   dofetilide (TIKOSYN) 250 MCG capsule Take 1 capsule (250 mcg total) by mouth in the morning and at bedtime.   febuxostat (ULORIC) 40 MG tablet Take 1 tablet (40 mg total) by mouth daily.   fluticasone furoate-vilanterol (BREO ELLIPTA) 100-25 MCG/ACT AEPB Inhale 1 puff into the lungs daily.   gabapentin (NEURONTIN) 100 MG capsule Take 2 capsules (200 mg total) by mouth at bedtime.   guaiFENesin (MUCINEX) 600 MG 12 hr tablet Take 600 mg by mouth 2 (two) times daily.   linaclotide (LINZESS) 145 MCG CAPS capsule Take 1 capsule (145 mcg total) by mouth daily as needed (constipation).   losartan (COZAAR) 25 MG tablet TAKE 1 TABLET(25 MG) BY MOUTH AT BEDTIME   Magnesium 500 MG TABS Take 500 mg by mouth at bedtime.   NEXLETOL 180 MG TABS TAKE 1 TABLET BY MOUTH DAILY    nystatin cream (MYCOSTATIN) Apply to affected area 2 times daily   OXYGEN Inhale 4 L into the lungs continuous. Use with resmed ventilator   potassium chloride (KLOR-CON) 10 MEQ tablet Take 1 tablet (10 mEq total) by mouth daily.   revefenacin (YUPELRI) 175 MCG/3ML  nebulizer solution Take 3 mLs (175 mcg total) by nebulization daily.   spironolactone (ALDACTONE) 25 MG tablet TAKE 1 TABLET(25 MG) BY MOUTH EVERY MORNING   torsemide (DEMADEX) 20 MG tablet Take 1 tablet (20 mg total) by mouth daily.   traMADol (ULTRAM) 50 MG tablet Take 1 tablet (50 mg total) by mouth every 6 (six) hours as needed.   XARELTO 20 MG TABS tablet TAKE 1 TABLET(20 MG) BY MOUTH DAILY WITH SUPPER   No facility-administered encounter medications on file as of 11/16/2022.    History obtained from review of EMR, discussion with patient.   CBC    Component Value Date/Time   WBC 10.0 09/15/2022 1134   RBC 4.86 09/15/2022 1134   HGB 12.9 09/15/2022 1134   HGB 13.4 12/28/2012 0748   HCT 40.8 09/15/2022 1134   HCT 39.2 12/28/2012 0748   PLT 274 09/15/2022 1134   PLT 186 12/28/2012 0748   MCV 84.0 09/15/2022 1134   MCV 87 12/28/2012 0748   MCH 26.5 09/15/2022 1134   MCHC 31.6 09/15/2022 1134   RDW 13.8 09/15/2022 1134   RDW 15.0 (H) 12/28/2012 0748   LYMPHSABS 1.6 05/22/2022 0901   LYMPHSABS 0.4 (L) 12/28/2012 0748   MONOABS 0.7 05/22/2022 0901   MONOABS 0.5 12/28/2012 0748   EOSABS 0.1 05/22/2022 0901   EOSABS 0.0 12/28/2012 0748   BASOSABS 0.1 05/22/2022 0901   BASOSABS 0.0 12/28/2012 0748    CMP    Latest Ref Rng & Units 09/15/2022   11:34 AM 08/03/2022   11:03 AM 05/19/2022   12:11 PM  CMP  Glucose 70 - 99 mg/dL 99  409  811   BUN 8 - 23 mg/dL 19  22  16    Creatinine 0.44 - 1.00 mg/dL 9.14  7.82  9.56   Sodium 135 - 145 mmol/L 139  138  140   Potassium 3.5 - 5.1 mmol/L 4.3  3.8  4.5   Chloride 98 - 111 mmol/L 98  98  103   CO2 22 - 32 mmol/L 29  28  28    Calcium 8.9 - 10.3 mg/dL 21.3  9.5  9.8       I reviewed available labs, medications, imaging, studies and related documents from the EMR.  Records reviewed and summarized above.   Physical Exam: GENERAL: NAD LUNGS: Fine crackles RLL and scattered in left lung, no increased work of breathing, on 4 L CARDIAC:  S1S2, RRR with lusb murmur, trace  edema BLE /cyanosis ABD:  Normo-active BS x 4 quads, mod distended soft, non-tender EXTREMITIES: Normal ROM, no deformity, strength equal, No muscle atrophy/subcutaneous fat loss NEURO:  No weakness or cognitive impairment PSYCH:  non-anxious affect, A & O x 3  Thank you for the opportunity to participate in the care of Maybel Leaman. Please call our main office at [phone] if we can be of additional assistance.    Joycelyn Man FNP-C  [email or web address]  Phone:  [phone]

## 2022-11-17 ENCOUNTER — Ambulatory Visit (HOSPITAL_COMMUNITY): Payer: Medicare Other | Admitting: Physician Assistant

## 2022-11-17 NOTE — Progress Notes (Signed)
EPIC Encounter for ICM Monitoring  Patient Name: Kathleen [name] is a 77 y.o. female Date: 11/17/2022 Primary Care Physican: [name], MD Primary Cardiologist: Bensimhon Electrophysiologist: Camnitz Bi-V Pacing: 100%          05/19/2023 Weight: 184 lbs  06/30/2022 Weight: 181 lbs 07/03/2022 Weight: 188 lbs 07/15/2022   Weight: 187.4 lbs 07/22/2022 Weight: 191 lbs 07/28/2022 Weight: 184.6 lbs 09/07/2022   Weight: 187 lbs 11/17/2022 Weight: 190-191 lbs                                                     Spoke with patient and heart failure questions reviewed.  Transmission results reviewed.  Pt asymptomatic for fluid accumulation.  Reports feeling well at this time and voices no complaints.  Palliative care nurse advised to take extra Torsemide for the next 3 days due to what she heard in the left lung.    Diet:  She lives in an assisted living  Eats foods that are prepared at the facility she lives in which are not low salt.     Optivol thoracic impedance suggesting normal fluid levels.   Prescribed:  Torsemide 20 mg take 1 tablet(s) (20 mg total) by mouth daily.   Potassium 10 mEq take 1 tablet (10 mEq total) by mouth daily  Spironolactone 25 mg take 1 tablet daily   Labs: 08/18/2022 Creatinine 0.8,   BUN 24, Potassium 4.3, Sodium 142, GFR 77 08/03/2022 Creatinine 0.80, BUN 22, Potassium 3.8, Sodium 138, GFR >60 05/19/2022 Creatinine 0.72, BUN 16, Potassium 4.5, Sodium 140  A complete set of results can be found in Results Review.   Recommendations:  No changes and encouraged to call if experiencing any fluid symptoms.   Follow-up plan: ICM clinic phone appointment on 12/21/2022.  91 day device clinic remote transmission 12/30/2022.     EP/Cardiology Office Visits:    Recall 01/13/2023 with Dr Gala Romney.  Recall 05/13/2023 with Francis Dowse, PA or Otilio Saber, [place].     Copy of ICM check sent to Dr. Elberta Fortis.   3 month ICM trend: 11/16/2022.    12-14 Month ICM trend:     Karie Soda, RN 11/17/2022 4:01 PM

## 2022-11-18 ENCOUNTER — Ambulatory Visit (HOSPITAL_COMMUNITY)
Admission: RE | Admit: 2022-11-18 | Discharge: 2022-11-18 | Disposition: A | Discharge status: Home / Self Care | Payer: Medicare Other | Source: Ambulatory Visit | Attending: Physician Assistant | Admitting: Physician Assistant

## 2022-11-18 VITALS — BP 94/72 | HR 72 | Ht 60.0 in | Wt 191.4 lb

## 2022-11-18 DIAGNOSIS — Z79899 Other long term (current) drug therapy: Secondary | ICD-10-CM | POA: Insufficient documentation

## 2022-11-18 DIAGNOSIS — D6869 Other thrombophilia: Secondary | ICD-10-CM | POA: Insufficient documentation

## 2022-11-18 DIAGNOSIS — I4819 Other persistent atrial fibrillation: Secondary | ICD-10-CM | POA: Insufficient documentation

## 2022-11-18 DIAGNOSIS — Z87891 Personal history of nicotine dependence: Secondary | ICD-10-CM | POA: Diagnosis not present

## 2022-11-18 DIAGNOSIS — Z5181 Encounter for therapeutic drug level monitoring: Secondary | ICD-10-CM | POA: Diagnosis not present

## 2022-11-18 DIAGNOSIS — I442 Atrioventricular block, complete: Secondary | ICD-10-CM | POA: Diagnosis not present

## 2022-11-18 DIAGNOSIS — I110 Hypertensive heart disease with heart failure: Secondary | ICD-10-CM | POA: Diagnosis not present

## 2022-11-18 DIAGNOSIS — Z7901 Long term (current) use of anticoagulants: Secondary | ICD-10-CM | POA: Insufficient documentation

## 2022-11-18 DIAGNOSIS — D6859 Other primary thrombophilia: Secondary | ICD-10-CM | POA: Insufficient documentation

## 2022-11-18 DIAGNOSIS — I5022 Chronic systolic (congestive) heart failure: Secondary | ICD-10-CM | POA: Diagnosis not present

## 2022-11-18 LAB — MAGNESIUM: Magnesium: 2.4 mg/dL (ref 1.7–2.4)

## 2022-11-18 NOTE — Progress Notes (Signed)
Primary Care Physician: [name], MD Referring Physician:  Dr. Johney Frame  Prior EP: Dr. Johney Frame AHF: Dr.Benshimon   Kathleen [name] is a 77 y.o. female with a h/o  obesity, BOOP, COPD, persistent  afib, and CHB s/p AV nodal ablation and PPM (MDT) by Dr Christin Fudge who presents today to establish care in the Electrophysiology device clinic  Per Dr. Jenel Lucks note of 05/2021,   She has had a challenging CV history.  She says that she was diagnosed with atrial fibrillation in 2014.  She took multaq but developed a rash.  She was placed on tikosyn but continued to have afib.  She appears to have had PVI + CTI by Dr Christin Fudge 2/15.  She states that this took 9 hours and was not successful.  She continued to have atrial arrhythmias.  She underwent AV nodal ablation and pacemaker implantation 11/02/15.  LV lead could not be placed however she received a CRT-P device.  Per Dr Isidore Moos recent note, it appears that the lead was not placed due to high LV lead thresholds.  She had return of her AV nodal conduction and required repeat AV nodal ablation by Dr Wilford Grist the following day.  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  She returned to EP lab and had the device placed submuscular Sept 2015.  Per Dr Donia Ast note (8/22), she had redo ablation 2019.    She was started on tikosyn 3/22 with improvement in her AF burden.The patient also had left bundle lead placed 05/09/2020.  She was hospitalized  for sepsis due to extensive chest wall cellulitis 12/22 at [facility] She no longer is followed at [facility] as she moved out of the area.   She is in the afib clinic for Tikosyn surveillance. She is a sensed/v paced. She has not had any awareness of afib. She continues on tikosyn 250 mcg bid. No issues with anticoagulation.     She was placed on Tikosyn possibly last March 2022 at [facility]. She struggles with chronic lung disease. She is on CPAP and continuous O2 via  nasal cannula at 4 L /min  F/u in afib clinic 12/26/22 for Tikosyn surveillance. She is av paced. She reports that she had a fall with head trauma end of April with subarachnoid hemorrhage that resolved holding xarelto that  did not require reversal of xarelto. She also that TSH indicated hyperthyroidism at .20 and her replacement was stopped but now feels her symptoms of hypothyroidism have returned. Asking for a TSH to be drawn today. Last paceart report did not show any significant afib burden.   Follow up in the AF clinic 11/18/22. Patient reports that she has been feeling much more fatigued over the past several weeks. She has seen Dr Vassie Loll and has been on two rounds of antibiotics, felt to be acute bronchitis. She has finished Augmentin but does not feel better. She had a subjective fever two days ago. Optivol on her PPM shows normal fluid levels. PPM also shows 0% afib burden.   Today, she denies symptoms of palpitations, chest pain, orthopnea, PND, lower extremity edema, dizziness, presyncope, syncope, or neurologic sequela. The patient is tolerating medications without difficulties and is otherwise without complaint today.   Past Medical History:  Diagnosis Date   Asthma    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HCC)    CHF (congestive heart failure) (HCC)    Chronic renal insufficiency    Complete  heart block (HCC) s/p AV nodal ablation    Concussion 10/04/2021   COPD (chronic obstructive pulmonary disease) (HCC)    Gout    Hypertension    Longstanding persistent atrial fibrillation (HCC)    on Xarelto   Nonischemic cardiomyopathy (HCC)    Obesity    Pacemaker    Spontaneous pneumothorax 2013   Past Surgical History:  Procedure Laterality Date   ABDOMINAL HYSTERECTOMY     APPENDECTOMY     ATRIAL FIBRILLATION ABLATION  07/20/2013   by Dr Christin Fudge   AV nodal ablation  11/01/2013   by Dr Christin Fudge, repeated by Dr Wilford Grist   BREAST BIOPSY Bilateral 1997   negative    CARDIAC CATHETERIZATION     CHOLECYSTECTOMY     HERNIA REPAIR     PACEMAKER INSERTION  06/2017   MDT Viva CRT-P implanted by Dr Christin Fudge after AV nodal ablation,  LV lead could not be placed   PACEMAKER INSERTION  05/2020   with lead bundle   RIGHT/LEFT HEART CATH AND CORONARY ANGIOGRAPHY N/A 03/20/2022   Procedure: RIGHT/LEFT HEART CATH AND CORONARY ANGIOGRAPHY;  Surgeon: Swaziland, Peter M, MD;  Location: [facility] INVASIVE CV LAB;  Service: Cardiovascular;  Laterality: N/A;    Current Outpatient Medications  Medication Sig Dispense Refill   acetaminophen (TYLENOL) 650 MG CR tablet Take 1,300 mg by mouth every 8 (eight) hours as needed for pain.     albuterol (PROVENTIL) (2.5 MG/3ML) 0.083% nebulizer solution Take 2.5 mg by nebulization every 6 (six) hours as needed for wheezing or shortness of breath.     albuterol (VENTOLIN HFA) 108 (90 Base) MCG/ACT inhaler Inhale 2 puffs into the lungs every 4 (four) hours as needed for wheezing or shortness of breath. 8 g 2   azelastine (ASTELIN) 0.1 % nasal spray Place 2 sprays into both nostrils 2 (two) times daily. 90 mL 0   Cholecalciferol (VITAMIN D3) 50 MCG (2000 UT) TABS Take 2,000 Units by mouth at bedtime.     cyanocobalamin (VITAMIN B12) 1000 MCG/ML injection Inject 1 mL (1,000 mcg total) into the muscle every 30 (thirty) days. 10 mL 0   cyclobenzaprine (FLEXERIL) 5 MG tablet TAKE 1 TABLET BY MOUTH AT BEDTIME AS NEEDED FOR LATERAL HIP PAIN 30 tablet 0   docusate sodium (COLACE) 100 MG capsule Take 100 mg by mouth 2 (two) times daily.     dofetilide (TIKOSYN) 250 MCG capsule Take 1 capsule (250 mcg total) by mouth in the morning and at bedtime. 60 capsule 6   febuxostat (ULORIC) 40 MG tablet Take 1 tablet (40 mg total) by mouth daily. 90 tablet 0   fluticasone furoate-vilanterol (BREO ELLIPTA) 100-25 MCG/ACT AEPB Inhale 1 puff into the lungs daily. 30 each 5   gabapentin (NEURONTIN) 100 MG capsule Take 2 capsules (200 mg total) by mouth at bedtime. 60  capsule 6   guaiFENesin (MUCINEX) 600 MG 12 hr tablet Take 600 mg by mouth 2 (two) times daily.     linaclotide (LINZESS) 145 MCG CAPS capsule Take 1 capsule (145 mcg total) by mouth daily as needed (constipation). 90 capsule 0   losartan (COZAAR) 25 MG tablet TAKE 1 TABLET(25 MG) BY MOUTH AT BEDTIME 90 tablet 3   Magnesium 500 MG TABS Take 500 mg by mouth at bedtime.     NEXLETOL 180 MG TABS TAKE 1 TABLET BY MOUTH DAILY 90 tablet 0   nystatin cream (MYCOSTATIN) Apply to affected area 2 times daily 90 g 1   OXYGEN  Inhale 4 L into the lungs continuous. Use with resmed ventilator     potassium chloride (KLOR-CON) 10 MEQ tablet Take 1 tablet (10 mEq total) by mouth daily. 90 tablet 3   revefenacin (YUPELRI) 175 MCG/3ML nebulizer solution Take 3 mLs (175 mcg total) by nebulization daily. 90 mL 11   spironolactone (ALDACTONE) 25 MG tablet TAKE 1 TABLET(25 MG) BY MOUTH EVERY MORNING 90 tablet 3   torsemide (DEMADEX) 20 MG tablet Take 1 tablet (20 mg total) by mouth daily. 90 tablet 2   traMADol (ULTRAM) 50 MG tablet Take 1 tablet (50 mg total) by mouth every 6 (six) hours as needed. 360 tablet 1   XARELTO 20 MG TABS tablet TAKE 1 TABLET(20 MG) BY MOUTH DAILY WITH SUPPER 30 tablet 5   No current facility-administered medications for this encounter.    Allergies  Allergen Reactions   Allopurinol Other (See Comments)    Reaction:  Dizziness    Clindamycin Anaphylaxis and Hives   Flublok [Influenza Vaccine Recombinant] Other (See Comments)    Fever 103 with no alternative explanation day after vaccine.  Clydie Braun Highfill FNP-C   Pneumococcal 13-Val Conj Vacc Itching, Swelling and Rash   Dronedarone Rash   Brovana [Arformoterol]     Caused muscle pain   Budesonide     Caused extreme joint pain   Entresto [Sacubitril-Valsartan] Other (See Comments)    hypotension   Fosamax [Alendronate Sodium] Nausea Only   Jardiance [Empagliflozin]     Caused a vaginal infection   Meperidine Nausea And Vomiting    Microplegia Msa-Msg [Plegisol]     Loopy,diarrhea   Rosuvastatin Other (See Comments)    Reaction:  Muscle spasms    Tetracycline Hives   Adhesive [Tape] Rash   Lovastatin Rash and Other (See Comments)    Muscle Pain    Social History   Socioeconomic History   Marital status: Widowed    Spouse name: Not on file   Number of children: 1   Years of education: Not on file   Highest education level: Not on file  Occupational History   Occupation: retired  Tobacco Use   Smoking status: Former    Packs/day: 1.00    Years: 20.00    Additional pack years: 0.00    Total pack years: 20.00    Types: Cigarettes    Quit date: 06/09/1987    Years since quitting: 35.4    Passive exposure: Past   Smokeless tobacco: Never   Tobacco comments:    Former smoker 12/25/21  Vaping Use   Vaping Use: Never used  Substance and Sexual Activity   Alcohol use: No    Alcohol/week: 0.0 standard drinks of alcohol   Drug use: No   Sexual activity: Not Currently  Other Topics Concern   Not on file  Social History Narrative   Pt lives in [place] alone.  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  Her son works in [employer].   Social Determinants of Health   Financial Resource Strain: Low Risk  (05/21/2022)   Overall Financial Resource Strain (CARDIA)    Difficulty of Paying Living Expenses: Not very hard  Food Insecurity: No Food Insecurity (05/21/2022)   Hunger Vital Sign    Worried About Running Out of Food in the Last Year: Never true    Ran Out of Food in the Last Year: Never true  Transportation Needs: No Transportation Needs (05/21/2022)   PRAPARE - Transportation    Lack  of Transportation (Medical): No    Lack of Transportation (Non-Medical): No  Physical Activity: Insufficiently Active (05/21/2022)   Exercise Vital Sign    Days of Exercise per Week: 2 days    Minutes of Exercise per Session: 20 min  Stress: No Stress Concern Present (05/21/2022)   [employer] of  Occupational Health - Occupational Stress Questionnaire    Feeling of Stress : Only a little  Social Connections: Moderately Isolated (05/21/2022)   Social Connection and Isolation Panel [NHANES]    Frequency of Communication with Friends and Family: More than three times a week    Frequency of Social Gatherings with Friends and Family: More than three times a week    Attends Religious Services: Never    Database administrator or Organizations: Yes    Attends Engineer, structural: More than 4 times per year    Marital Status: Widowed  Intimate Partner Violence: Not At Risk (05/21/2022)   Humiliation, Afraid, Rape, and Kick questionnaire    Fear of Current or Ex-Partner: No    Emotionally Abused: No    Physically Abused: No    Sexually Abused: No    Family History  Problem Relation Age of Onset   Breast cancer Mother 9       3 different times   Pancreatic cancer Mother    Emphysema Father    Healthy Brother    Breast cancer Cousin    Valvular heart disease Son    Obesity Daughter    Colon cancer Neg Hx    Esophageal cancer Neg Hx    Stomach cancer Neg Hx    Inflammatory bowel disease Neg Hx    Liver disease Neg Hx    Rectal cancer Neg Hx     ROS- All systems are reviewed and negative except as per the HPI above  Physical Exam: Vitals:   11/18/22 1041  BP: 94/72  Pulse: 72  Weight: 86.8 kg  Height: 5' (1.524 m)    Wt Readings from Last 3 Encounters:  11/18/22 86.8 kg  11/05/22 86.8 kg  09/24/22 85.7 kg    GEN- The patient is a well appearing female, alert and oriented x 3 today.   HEENT-head normocephalic, atraumatic, sclera clear, conjunctiva pink, hearing intact, trachea midline. Lungs- Clear to ausculation bilaterally, normal work of breathing, on O2 nasal canula Heart- Regular rate and rhythm, no murmurs, rubs or gallops  GI- soft, NT, ND, + BS Extremities- no clubbing, cyanosis, or edema MS- no significant deformity or atrophy Skin- no rash or  lesion Psych- euthymic mood, full affect Neuro- strength and sensation are intact   EKG today demonstrates AV dual paced rhythm Vent. rate 73 BPM PR interval 178 ms QRS duration 148 ms QT/QTcB 440/484 ms   CHA2DS2-VASc Score = 5  The patient's score is based upon: CHF History: 1 HTN History: 1 Diabetes History: 0 Stroke History: 0 Vascular Disease History: 0 Age Score: 2 Gender Score: 1       ASSESSMENT AND PLAN: 1. Persistent Atrial Fibrillation (ICD10:  I48.19) The patient's CHA2DS2-VASc score is 5, indicating a 7.2% annual risk of stroke.   Very complicated history (see HPI) prior care at [facility] Last device interrogation showed 0% afib burden.  Continue dofetilide 250 mcg BID. QT stable. Continue Xarelto 20 mg daily Recent bmet reviewed, check magnesium today.  Fatigue does not appear afib related. She plans to reach back out to her pulmonologist.   2. Secondary Hypercoagulable State (ICD10:  Z61.09) The patient is at significant risk for stroke/thromboembolism based upon her CHA2DS2-VASc Score of 5.  Continue Rivaroxaban (Xarelto).   3. Chronic systolic HF Per Dr. Gala Romney  Fluid status appears stable. Optivol on 11/16/22 suggested normal fluid levels.  4. HTN Stable, no changes today.  5. Complete Heart Block S/p AV nodal ablation at [facility]  Per device clinic   Follow up in the AF clinic in 6 months.    Jorja Loa PA-C Afib Clinic [facility] [address] [place], [place] [address] [phone]

## 2022-11-23 ENCOUNTER — Encounter: Payer: Self-pay | Admitting: Family Medicine

## 2022-11-25 ENCOUNTER — Encounter: Payer: Medicare Other | Admitting: Family Medicine

## 2022-11-26 ENCOUNTER — Non-Acute Institutional Stay: Payer: Medicare Other | Admitting: Family Medicine

## 2022-11-26 ENCOUNTER — Encounter: Payer: Self-pay | Admitting: Family Medicine

## 2022-11-26 ENCOUNTER — Other Ambulatory Visit: Payer: Self-pay | Admitting: Family Medicine

## 2022-11-26 VITALS — HR 72 | Temp 97.4°F | Resp 20 | Wt 192.0 lb

## 2022-11-26 DIAGNOSIS — I482 Chronic atrial fibrillation, unspecified: Secondary | ICD-10-CM

## 2022-11-26 DIAGNOSIS — R300 Dysuria: Secondary | ICD-10-CM

## 2022-11-26 DIAGNOSIS — I5022 Chronic systolic (congestive) heart failure: Secondary | ICD-10-CM

## 2022-11-26 DIAGNOSIS — N1831 Chronic kidney disease, stage 3a: Secondary | ICD-10-CM

## 2022-11-26 DIAGNOSIS — J9611 Chronic respiratory failure with hypoxia: Secondary | ICD-10-CM

## 2022-11-26 NOTE — Progress Notes (Signed)
Therapist, nutritional Palliative Care Consult Note Telephone: [phone]  Fax: [phone]   Date of encounter: 11/26/22 11:22 AM PATIENT NAME: Kathleen [name] [address] [place] [address]   [phone] (home)  DOB: 1945/09/07 MRN: [id number] PRIMARY CARE PROVIDER:    [name], MD,  [address] [place] [address] [phone]  REFERRING PROVIDER:   [name], MD [address] [place],  [place] [address] [phone]  Emergency Contact:    Contact Information     Name Relation Home Work [place] Son [phone]  [phone]   Tea, [place] [phone]  [phone]   Fanny Skates Daughter   [phone]        I met face to face with patient in [facility] facility. Palliative Care was asked to follow this patient by consultation request of [name], MD to address advance care planning and complex medical decision making. This is a follow up visit.  CODE STATUS: Full Code    ASSESSMENT AND / RECOMMENDATIONS:  PPS: 60%   Chronic systolic heart failure Pt lost 3 lbs on increased Torsemide dose but stated poor urine output, then gained 5 lbs. Symptomatic with PND. Continue Cozaar 25 mg at bedtime, Torsemide daily and Spironolactone, Nexletol for rhythm control. Stat CXR outpatient, BNP  2.  Chronic atrial fibrillation Rate controlled on Nexlitol Has been off anticoagulation since severe fall with head bleed > 1 year ago. Continue O2 at 4L and Bi-Pap.   3.  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 Currently no increased sputum production/wheezing or fever. Doubt PNA or COPD exacerbation-obtain CXR, CBC.  4.  CKD stage 3 Encourage increased fluid intake.   CMP stat to be done at [facility].  5.  Constipation Poor bowel sounds and no results with  multiple agents. Acute flat and upright abdomen to assess for bowel obstruction or ileus.  Follow up Palliative Care Visit:  Palliative Care continuing to follow up by monitoring for changes in appetite, weight, functional and cognitive status for chronic disease progression and management in agreement with patient's stated goals of care. Next visit in 7-10 days or prn.  This visit was coded based on medical decision making (MDM).  Chief Complaint  Palliative Care is continuing to follow pt for chronic medical management in setting of COPD and heart failure.  HISTORY OF PRESENT ILLNESS: Kathleen [name] is a 77 y.o. year old female with COPD and systolic heart failure, having increased dizziness today different than her normal vertigo sensation.   She states she has a feeling of not being strong enough to walk around.  She has gone shopping with her daughter-in-law and was tired but enjoyed the day.  She states she noted no significant improvement with Cefdinir.  She has continued to have significant SOB, had completed the 5-7 days of Prednisone 40 mg with no significant improvement.  She states having some left over Prednisone which she took.  She is doing PT for her shoulders.  She states she has black thin ribbon stool but has had significant difficulty with having a BM despite using Sennosides 2 po BID.  She has had nausea but no vomiting.  She is gaining weight, increased fatigue. Has had large amounts of gas and stools mucousy. Has been having PT for her arm/shoulder.  She has had increased fatigue and less activity tolerance.  Suffocating sensation with BiPap more so in the  last week.  ACTIVITIES OF DAILY LIVING: CONTINENT OF BLADDER/BOWEL? Yes BATHING/DRESSING/FEEDING: Independent   MOBILITY:   Rollator   APPETITE? Fair WEIGHT: 192 (down 188 then up to 192) lbs on home scale today  CURRENT PROBLEM LIST:  Patient Active Problem List   Diagnosis Date Noted   Hypercoagulable state due to  persistent atrial fibrillation (HCC) 11/18/2022   Acute on chronic heart failure with preserved ejection fraction (HCC) 10/26/2022   COPD with acute exacerbation (HCC) 10/09/2022   Pain in left shoulder 07/23/2022   Chronic obstructive pulmonary disease (HCC) 06/28/2022   Palliative care encounter 06/28/2022   Post-viral cough syndrome 06/02/2022   Paranasal sinus disease 05/19/2022   Heart failure with reduced ejection fraction (HCC) 05/14/2022   Pneumonia 04/29/2022   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   Depression, major, single episode, moderate (HCC) 04/27/2022   Non-STEMI (non-ST elevated myocardial infarction) (HCC) 03/18/2022   Hypotension 03/18/2022   Morbid obesity (HCC) 03/18/2022   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   Low TSH level 12/02/2021   Acute idiopathic gout of left hand 11/19/2021   Acquired hypothyroidism 11/12/2021   Encounter for palliative care involving management of pain 11/12/2021   Post concussive syndrome 11/11/2021   Chronic left-sided low back pain 11/04/2021   Gout 10/01/2021   Hyperlipidemia with target LDL less than 100 08/07/2021   Chronic anticoagulation 07/07/2021   Stage 3a chronic kidney disease (HCC) 05/23/2021   Dietary iron deficiency 05/23/2021   Age-related osteoporosis without current pathological fracture 04/22/2021   Type 2 diabetes mellitus with diabetic neuropathy, without long-term current use of insulin (HCC) 04/22/2021   Sleep apnea 01/22/2021   Statin intolerance 10/12/2019   Statin myopathy 02/24/2019   Chronic systolic heart failure (HCC) 04/06/2018   HTN (hypertension) 04/06/2018   Atrial fibrillation (HCC) 04/06/2018   Chronic respiratory failure with hypoxia (HCC) 03/28/2018   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 12/02/2015   Malnutrition of moderate degree 09/11/2015   PAST MEDICAL HISTORY:  Active Ambulatory Problems    Diagnosis Date Noted   Malnutrition of moderate degree 09/11/2015   Chronic  respiratory failure with hypoxia (HCC) 03/28/2018   Chronic systolic heart failure (HCC) 04/06/2018   HTN (hypertension) 04/06/2018   Atrial fibrillation (HCC) 04/06/2018   Sleep apnea 01/22/2021   Age-related osteoporosis without current pathological fracture 04/22/2021   Type 2 diabetes mellitus with diabetic neuropathy, without long-term current use of insulin (HCC) 04/22/2021   Stage 3a chronic kidney disease (HCC) 05/23/2021   Dietary iron deficiency 05/23/2021   Chronic anticoagulation 07/07/2021   Hyperlipidemia with target LDL less than 100 08/07/2021   Gout 10/01/2021   Chronic left-sided low back pain 11/04/2021   Post concussive syndrome 11/11/2021   Acquired hypothyroidism 11/12/2021   Encounter for palliative care involving management of pain 11/12/2021   Acute idiopathic gout of left hand 11/19/2021   Low TSH level 12/02/2021   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   Non-STEMI (non-ST elevated myocardial infarction) (HCC) 03/18/2022   Hypotension 03/18/2022   Morbid obesity (HCC) 03/18/2022   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 12/02/2015   Statin myopathy 02/24/2019   Statin intolerance 10/12/2019   Depression, major, single episode, moderate (HCC) 04/27/2022   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   Pneumonia 04/29/2022   Heart failure with reduced ejection fraction (HCC) 05/14/2022   Paranasal sinus disease 05/19/2022   Post-viral cough syndrome 06/02/2022   Chronic obstructive pulmonary disease (HCC) 06/28/2022   Palliative care encounter 06/28/2022   Pain in left shoulder 07/23/2022  COPD with acute exacerbation (HCC) 10/09/2022   Acute on chronic heart failure with preserved ejection fraction (HCC) 10/26/2022   Hypercoagulable state due to persistent atrial fibrillation (HCC) 11/18/2022   Resolved Ambulatory Problems    Diagnosis Date Noted   Fever 09/08/2015   Hyponatremia 09/08/2015   Acute respiratory failure with hypoxia (HCC) 09/08/2015    Bacterial pneumonia 09/08/2015   Acute on chronic respiratory failure with hypoxia (HCC) 09/08/2015   Acute bronchitis 09/17/2015   COPD (chronic obstructive pulmonary disease) (HCC) 04/06/2018   Hypercalcemia 06/26/2019   (HFpEF) heart failure with preserved ejection fraction (HCC) 06/26/2019   Acute kidney injury superimposed on CKD (HCC) 06/26/2019   Generalized abdominal pain    Constipation    Community acquired pneumonia 11/14/2020   Chronic hyperglycemia 01/22/2021   Daytime somnolence 01/22/2021   Penicillin allergy 01/23/2021   Pneumonia of left lower lobe due to infectious organism 04/22/2021   Chronic idiopathic constipation 04/22/2021   Flu vaccine need 04/22/2021   Cellulitis of breast 05/10/2021   Cellulitis of chest wall 05/11/2021   Sepsis (HCC) 05/11/2021   Leukocytosis 05/11/2021   Dehydration 05/11/2021   Acute prerenal azotemia 05/11/2021   Allergic contact dermatitis due to drugs in contact with skin 05/23/2021   Candidiasis of skin 05/23/2021   Deficiency anemia 05/23/2021   Acute gout due to renal impairment involving foot 05/26/2021   Dysphagia 07/07/2021   Iron deficiency 07/07/2021   Abnormal CT scan, colon 07/07/2021   History of colon polyps 07/07/2021   Cough productive of purulent sputum 08/07/2021   LRTI (lower respiratory tract infection) 08/07/2021   Subarachnoid hemorrhage (HCC) 10/04/2021   Subarachnoid bleed (HCC) 10/04/2021   Blood in stool, frank 11/11/2021   Acute cough 11/19/2021   Left hand pain 11/19/2021   Acute conjunctivitis of right eye 03/03/2022   Severe sepsis (HCC) 03/17/2022   Acute on chronic combined systolic and diastolic CHF (congestive heart failure) (HCC) 03/19/2022   Hospital discharge follow-up 03/25/2022   History of severe sepsis 03/25/2022   CAP (community acquired pneumonia) 04/21/2022   RSV infection 05/14/2022   Past Medical History:  Diagnosis Date   Asthma    BOOP (bronchiolitis obliterans with  organizing pneumonia) (HCC)    CHF (congestive heart failure) (HCC)    Chronic renal insufficiency    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    Concussion 10/04/2021   Hypertension    Longstanding persistent atrial fibrillation (HCC)    Nonischemic cardiomyopathy (HCC)    Obesity    Pacemaker    Spontaneous pneumothorax 2013   SOCIAL HX:  Social History   Tobacco Use   Smoking status: Former    Packs/day: 1.00    Years: 20.00    Additional pack years: 0.00    Total pack years: 20.00    Types: Cigarettes    Quit date: 06/09/1987    Years since quitting: 35.4    Passive exposure: Past   Smokeless tobacco: Never   Tobacco comments:    Former smoker 12/25/21  Substance Use Topics   Alcohol use: No    Alcohol/week: 0.0 standard drinks of alcohol   FAMILY HX:  Family History  Problem Relation Age of Onset   Breast cancer Mother 56       3 different times   Pancreatic cancer Mother    Emphysema Father    Healthy Brother    Breast cancer Cousin    Valvular heart disease Son    Obesity Daughter  Colon cancer Neg Hx    Esophageal cancer Neg Hx    Stomach cancer Neg Hx    Inflammatory bowel disease Neg Hx    Liver disease Neg Hx    Rectal cancer Neg Hx        Preferred Pharmacy: ALLERGIES:  Allergies  Allergen Reactions   Allopurinol Other (See Comments)    Reaction:  Dizziness    Clindamycin Anaphylaxis and Hives   Flublok [Influenza Vaccine Recombinant] Other (See Comments)    Fever 103 with no alternative explanation day after vaccine.  Clydie Braun Dante Cooter FNP-C   Pneumococcal 13-Val Conj Vacc Itching, Swelling and Rash   Dronedarone Rash   Brovana [Arformoterol]     Caused muscle pain   Budesonide     Caused extreme joint pain   Entresto [Sacubitril-Valsartan] Other (See Comments)    hypotension   Fosamax [Alendronate Sodium] Nausea Only   Jardiance [Empagliflozin]     Caused a vaginal infection   Meperidine Nausea And Vomiting   Microplegia  Msa-Msg [Plegisol]     Loopy,diarrhea   Rosuvastatin Other (See Comments)    Reaction:  Muscle spasms    Tetracycline Hives   Adhesive [Tape] Rash   Lovastatin Rash and Other (See Comments)    Muscle Pain     PERTINENT MEDICATIONS:  Outpatient Encounter Medications as of 11/26/2022  Medication Sig   acetaminophen (TYLENOL) 650 MG CR tablet Take 1,300 mg by mouth every 8 (eight) hours as needed for pain.   albuterol (PROVENTIL) (2.5 MG/3ML) 0.083% nebulizer solution Take 2.5 mg by nebulization every 6 (six) hours as needed for wheezing or shortness of breath.   albuterol (VENTOLIN HFA) 108 (90 Base) MCG/ACT inhaler Inhale 2 puffs into the lungs every 4 (four) hours as needed for wheezing or shortness of breath.   azelastine (ASTELIN) 0.1 % nasal spray Place 2 sprays into both nostrils 2 (two) times daily.   Cholecalciferol (VITAMIN D3) 50 MCG (2000 UT) TABS Take 2,000 Units by mouth at bedtime.   cyanocobalamin (VITAMIN B12) 1000 MCG/ML injection Inject 1 mL (1,000 mcg total) into the muscle every 30 (thirty) days.   cyclobenzaprine (FLEXERIL) 5 MG tablet TAKE 1 TABLET BY MOUTH AT BEDTIME AS NEEDED FOR LATERAL HIP PAIN   docusate sodium (COLACE) 100 MG capsule Take 100 mg by mouth 2 (two) times daily.   dofetilide (TIKOSYN) 250 MCG capsule Take 1 capsule (250 mcg total) by mouth in the morning and at bedtime.   febuxostat (ULORIC) 40 MG tablet Take 1 tablet (40 mg total) by mouth daily.   fluticasone furoate-vilanterol (BREO ELLIPTA) 100-25 MCG/ACT AEPB Inhale 1 puff into the lungs daily.   gabapentin (NEURONTIN) 100 MG capsule Take 2 capsules (200 mg total) by mouth at bedtime.   guaiFENesin (MUCINEX) 600 MG 12 hr tablet Take 600 mg by mouth 2 (two) times daily.   linaclotide (LINZESS) 145 MCG CAPS capsule Take 1 capsule (145 mcg total) by mouth daily as needed (constipation).   losartan (COZAAR) 25 MG tablet TAKE 1 TABLET(25 MG) BY MOUTH AT BEDTIME   Magnesium 500 MG TABS Take 500 mg by  mouth at bedtime.   NEXLETOL 180 MG TABS TAKE 1 TABLET BY MOUTH DAILY   nystatin cream (MYCOSTATIN) Apply to affected area 2 times daily   OXYGEN Inhale 4 L into the lungs continuous. Use with resmed ventilator   potassium chloride (KLOR-CON) 10 MEQ tablet Take 1 tablet (10 mEq total) by mouth daily.   revefenacin (YUPELRI) 175 MCG/3ML  nebulizer solution Take 3 mLs (175 mcg total) by nebulization daily.   spironolactone (ALDACTONE) 25 MG tablet TAKE 1 TABLET(25 MG) BY MOUTH EVERY MORNING   torsemide (DEMADEX) 20 MG tablet Take 1 tablet (20 mg total) by mouth daily.   traMADol (ULTRAM) 50 MG tablet Take 1 tablet (50 mg total) by mouth every 6 (six) hours as needed.   XARELTO 20 MG TABS tablet TAKE 1 TABLET(20 MG) BY MOUTH DAILY WITH SUPPER   No facility-administered encounter medications on file as of 11/26/2022.    History obtained from review of EMR, discussion with patient.   CBC    Component Value Date/Time   WBC 10.0 09/15/2022 1134   RBC 4.86 09/15/2022 1134   HGB 12.9 09/15/2022 1134   HGB 13.4 12/28/2012 0748   HCT 40.8 09/15/2022 1134   HCT 39.2 12/28/2012 0748   PLT 274 09/15/2022 1134   PLT 186 12/28/2012 0748   MCV 84.0 09/15/2022 1134   MCV 87 12/28/2012 0748   MCH 26.5 09/15/2022 1134   MCHC 31.6 09/15/2022 1134   RDW 13.8 09/15/2022 1134   RDW 15.0 (H) 12/28/2012 0748   LYMPHSABS 1.6 05/22/2022 0901   LYMPHSABS 0.4 (L) 12/28/2012 0748   MONOABS 0.7 05/22/2022 0901   MONOABS 0.5 12/28/2012 0748   EOSABS 0.1 05/22/2022 0901   EOSABS 0.0 12/28/2012 0748   BASOSABS 0.1 05/22/2022 0901   BASOSABS 0.0 12/28/2012 0748    CMP    Latest Ref Rng & Units 09/15/2022   11:34 AM 08/03/2022   11:03 AM 05/19/2022   12:11 PM  CMP  Glucose 70 - 99 mg/dL 99  161  096   BUN 8 - 23 mg/dL 19  22  16    Creatinine 0.44 - 1.00 mg/dL 0.45  4.09  8.11   Sodium 135 - 145 mmol/L 139  138  140   Potassium 3.5 - 5.1 mmol/L 4.3  3.8  4.5   Chloride 98 - 111 mmol/L 98  98  103   CO2  22 - 32 mmol/L 29  28  28    Calcium 8.9 - 10.3 mg/dL 91.4  9.5  9.8      I reviewed available labs, medications, imaging, studies and related documents from the EMR.  Records reviewed and summarized above.   Physical Exam: GENERAL: NAD LUNGS: Fine crackles RLL and scattered in left lung, no increased work of breathing, on 4 L CARDIAC:  S1S2, RRR with lusb murmur, trace  edema BLE /cyanosis ABD:  Normo-active BS x 4 quads, mod distended soft, non-tender EXTREMITIES: Normal ROM, no deformity, strength equal, No muscle atrophy/subcutaneous fat loss NEURO:  No weakness or cognitive impairment PSYCH:  non-anxious affect, A & O x 3  Thank you for the opportunity to participate in the care of Briggitte Boline. Please call our main office at [phone] if we can be of additional assistance.    Joycelyn Man FNP-C  [email or web address] .[facility]  Phone:  [phone]

## 2022-11-27 ENCOUNTER — Other Ambulatory Visit (HOSPITAL_BASED_OUTPATIENT_CLINIC_OR_DEPARTMENT_OTHER): Payer: Self-pay | Admitting: Family Medicine

## 2022-11-27 ENCOUNTER — Ambulatory Visit (HOSPITAL_BASED_OUTPATIENT_CLINIC_OR_DEPARTMENT_OTHER)
Admission: RE | Admit: 2022-11-27 | Discharge: 2022-11-27 | Disposition: A | Discharge status: Home / Self Care | Payer: Medicare Other | Source: Ambulatory Visit | Attending: Family Medicine | Admitting: Family Medicine

## 2022-11-27 DIAGNOSIS — J9611 Chronic respiratory failure with hypoxia: Secondary | ICD-10-CM

## 2022-11-27 DIAGNOSIS — I5033 Acute on chronic diastolic (congestive) heart failure: Secondary | ICD-10-CM

## 2022-12-01 ENCOUNTER — Other Ambulatory Visit (HOSPITAL_COMMUNITY): Payer: Self-pay | Admitting: *Deleted

## 2022-12-01 MED ORDER — DOFETILIDE 250 MCG PO CAPS: 250.0000 ug | ORAL_CAPSULE | Freq: Two times a day (BID) | ORAL | 6 refills | Status: DC

## 2022-12-02 ENCOUNTER — Encounter: Payer: Self-pay | Admitting: Sports Medicine

## 2022-12-02 ENCOUNTER — Other Ambulatory Visit: Payer: Self-pay | Admitting: *Deleted

## 2022-12-02 ENCOUNTER — Ambulatory Visit (INDEPENDENT_AMBULATORY_CARE_PROVIDER_SITE_OTHER): Payer: Medicare Other | Admitting: Sports Medicine

## 2022-12-02 DIAGNOSIS — M19012 Primary osteoarthritis, left shoulder: Secondary | ICD-10-CM

## 2022-12-02 DIAGNOSIS — M75121 Complete rotator cuff tear or rupture of right shoulder, not specified as traumatic: Secondary | ICD-10-CM | POA: Diagnosis not present

## 2022-12-02 DIAGNOSIS — M19011 Primary osteoarthritis, right shoulder: Secondary | ICD-10-CM

## 2022-12-02 DIAGNOSIS — J9611 Chronic respiratory failure with hypoxia: Secondary | ICD-10-CM | POA: Diagnosis not present

## 2022-12-02 DIAGNOSIS — G5793 Unspecified mononeuropathy of bilateral lower limbs: Secondary | ICD-10-CM

## 2022-12-02 DIAGNOSIS — M12812 Other specific arthropathies, not elsewhere classified, left shoulder: Secondary | ICD-10-CM

## 2022-12-02 MED ORDER — SPIRONOLACTONE 25 MG PO TABS: ORAL_TABLET | ORAL | 3 refills | Status: DC

## 2022-12-02 MED ORDER — BUPIVACAINE HCL 0.25 % IJ SOLN
2.0000 mL | INTRAMUSCULAR | Status: AC | PRN
Start: 2022-12-02 — End: 2022-12-02
  Administered 2022-12-02: 2 mL via INTRA_ARTICULAR

## 2022-12-02 MED ORDER — METHYLPREDNISOLONE ACETATE 40 MG/ML IJ SUSP
40.0000 mg | INTRAMUSCULAR | Status: AC | PRN
Start: 2022-12-02 — End: 2022-12-02
  Administered 2022-12-02: 40 mg via INTRA_ARTICULAR

## 2022-12-02 MED ORDER — LIDOCAINE HCL 1 % IJ SOLN
2.0000 mL | INTRAMUSCULAR | Status: AC | PRN
Start: 2022-12-02 — End: 2022-12-02
  Administered 2022-12-02: 2 mL

## 2022-12-02 NOTE — Progress Notes (Signed)
In pain; right shoulder especially  Currently taking prednisone due to a lung infection Gabapentin at night for pain

## 2022-12-02 NOTE — Progress Notes (Signed)
Kathleen [name] - 77 y.o. female MRN [id number]  Date of birth: January 24, 1946  Office Visit Note: Visit Date: 12/02/2022 PCP: [name], MD Referred by: [name], MD  Subjective: Chief Complaint  Patient presents with   Left Shoulder - Follow-up   Right Shoulder - Follow-up   HPI: Kathleen [name] is a pleasant 77 y.o. female who presents today for acute on chronic bilateral shoulder pain.  We did perform right shoulder injection on 08/13/2022 (SAJ) and did get her started in formalized physical therapy for both of her shoulders. Left SAJ injection on 10/21/22.  Her right shoulder is most bothersome today, feels it in the shoulder joint with crepitus to range of motion.  Also has some pain over the anterior AC joint.  She continues in physical therapy performing twice weekly, is noticing improvements in her range of motion.  Left shoulder essentially has full range of motion, right is still limited but improving.  She is taking gabapentin 200 mg nightly which helps and certainly has helped her lower extremity neuropathy.  Feels like she is able to feel the sole of her feet better.  Currently on a prednisone taper for her lung infection.  Does take tramadol as needed for pain as well.  Pertinent ROS were reviewed with the patient and found to be negative unless otherwise specified above in HPI.   Assessment & Plan: Visit Diagnoses:  1. Primary osteoarthritis of shoulders, bilateral   2. Rotator cuff arthropathy, left   3. Neuropathy involving both lower extremities   4. Complete tear of right rotator cuff, unspecified whether traumatic   5. Chronic respiratory failure with hypoxia (HCC)    Plan: Kathleen [name] continues with bilateral shoulder pain, she is experiencing an exacerbation of her right osteoarthritic shoulder.  She also has some pain over the [facility] joint today.  Through shared decision-making, proceeded with right shoulder subacromial joint injection which patient tolerated well.   She will notify me over the next 10-14 days the degree of improvement.  If she still has pain over top of the [facility] joint we may consider bringing her back in for an injection in this location.  I would like her to continue her formalized physical therapy and home rehab for both of her shoulders.  She will continue her gabapentin 200 mg nightly for this pain as well as her lower extremity neuropathy which does seem to be helping her.  As reminder, she is not a surgical candidate given her cardiopulmonary medical conditions.  Will see her back for both shoulders depending on the improvement from the right shoulder injection.  Follow-up: Return for She will call or message me in 10-14 days to update on shoulder.   Meds & Orders: No orders of the defined types were placed in this encounter.   Orders Placed This Encounter  Procedures   Large Joint Inj     Procedures: Large Joint Inj: R subacromial bursa on 12/02/2022 11:15 AM Indications: pain Details: 22 G 1.5 in needle, posterior approach Medications: 2 mL lidocaine 1 %; 2 mL bupivacaine 0.25 %; 40 mg methylPREDNISolone acetate 40 MG/ML Outcome: tolerated well, no immediate complications  Subacromial Joint Injection, Right Shoulder After discussion on risks/benefits/indications, informed verbal consent was obtained. A timeout was then performed. Patient was seated on table in exam room. The patient's shoulder was prepped with betadine and alcohol swabs and utilizing posterior approach a 22G, 1.5" needle was directed anteriorly and laterally into the patient's subacromial space was  injected with 2:2:1 mixture of lidocaine:bupivicaine:depomedrol with appreciation of free-flowing of the injectate into the bursal space. Patient tolerated the procedure well without immediate complications.   Procedure, treatment alternatives, risks and benefits explained, specific risks discussed. Consent was given by the patient. Immediately prior to procedure a time out  was called to verify the correct patient, procedure, equipment, support staff and site/side marked as required. Patient was prepped and draped in the usual sterile fashion.          Clinical History: No specialty comments available.  She reports that she quit smoking about 35 years ago. Her smoking use included cigarettes. She has a 20.00 pack-year smoking history. She has been exposed to tobacco smoke. She has never used smokeless tobacco.  Recent Labs    01/19/22 1110 04/30/22 0158  HGBA1C  --  5.8*  LABURIC 5.4  --     Objective:   Vital Signs: There were no vitals taken for this visit.  Physical Exam  Gen: Well-appearing, in no acute distress; non-toxic CV: Regular Rate. Well-perfused. Warm.  Resp: Breathing unlabored on 3-4 L of [place]. Psych: Fluid speech in conversation; appropriate affect; normal thought process Neuro: Sensation intact throughout. No gross coordination deficits.   Ortho Exam -Right shoulder: Positive TTP over the [facility] joint as well as palpating within the glenohumeral joint.  There is crepitus to range of motion, active forward flexion to 110 degrees, able to take her about 10 degrees further passively before bony block.  Left shoulder: There is full active and passive range of motion although mild pain with drop arm and empty can testing, no redness or swelling.  Imaging:  R-shoulder XR 08/13/22: 3 views of the right shoulder including AP, scapular Y and axial views  were ordered and reviewed by myself.  X-rays demonstrate significantly  high riding humeral head, suggestive of blowout rotator cuff tear.  There  is severe advanced osteoarthritis of the glenohumeral joint and AC joint.   No acute fracture noted.   Past Medical/Family/Surgical/Social History: Medications & Allergies reviewed per EMR, new medications updated. 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   Depression, major, single episode, moderate (HCC) 04/27/2022   Non-STEMI (non-ST elevated myocardial infarction) (HCC) 03/18/2022   Hypotension 03/18/2022   Morbid obesity (HCC) 03/18/2022   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   Low TSH level 12/02/2021   Acute idiopathic gout of left hand 11/19/2021   Acquired hypothyroidism 11/12/2021   Encounter for palliative care involving management of pain 11/12/2021   Post concussive syndrome 11/11/2021   Chronic left-sided low back pain 11/04/2021   Gout 10/01/2021   Hyperlipidemia with target LDL less than 100 08/07/2021   Chronic anticoagulation 07/07/2021   Stage 3a chronic kidney disease (HCC) 05/23/2021   Dietary iron deficiency 05/23/2021   Age-related osteoporosis without current pathological fracture 04/22/2021   Type 2 diabetes mellitus with diabetic neuropathy, without long-term current use of insulin (HCC) 04/22/2021   Sleep apnea 01/22/2021   Statin  intolerance 10/12/2019   Constipation    Statin myopathy 02/24/2019   Chronic systolic heart failure (HCC) 04/06/2018   HTN (hypertension) 04/06/2018   Atrial fibrillation (HCC) 04/06/2018   Chronic respiratory failure with hypoxia (HCC) 03/28/2018   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 12/02/2015   Malnutrition of moderate degree 09/11/2015   Past Medical History:  Diagnosis Date   Asthma    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HCC)    CHF (congestive heart failure)  (HCC)    Chronic renal insufficiency    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    Concussion 10/04/2021   COPD (chronic obstructive pulmonary disease) (HCC)    Gout    Hypertension    Longstanding persistent atrial fibrillation (HCC)    on Xarelto   Nonischemic cardiomyopathy (HCC)    Obesity    Pacemaker    Spontaneous pneumothorax 2013   Family History  Problem Relation Age of Onset   Breast cancer Mother 68       3 different times   Pancreatic cancer Mother    Emphysema Father    Healthy Brother    Breast cancer Cousin    Valvular heart disease Son    Obesity Daughter    Colon cancer Neg Hx    Esophageal cancer Neg Hx    Stomach cancer Neg Hx    Inflammatory bowel disease Neg Hx    Liver disease Neg Hx    Rectal cancer Neg Hx    Past Surgical History:  Procedure Laterality Date   ABDOMINAL HYSTERECTOMY     APPENDECTOMY     ATRIAL FIBRILLATION ABLATION  07/20/2013   by Dr Christin Fudge   AV nodal ablation  11/01/2013   by Dr Christin Fudge, repeated by Dr Wilford Grist   BREAST BIOPSY Bilateral 1997   negative   CARDIAC CATHETERIZATION     CHOLECYSTECTOMY     HERNIA REPAIR     PACEMAKER INSERTION  06/2017   MDT Viva CRT-P implanted by Dr Christin Fudge after AV nodal ablation,  LV lead could not be placed   PACEMAKER INSERTION  05/2020   with lead bundle   RIGHT/LEFT HEART CATH AND CORONARY ANGIOGRAPHY N/A 03/20/2022   Procedure: RIGHT/LEFT HEART CATH AND CORONARY ANGIOGRAPHY;  Surgeon: [place], Peter M, MD;  Location: [facility] INVASIVE CV LAB;  Service: Cardiovascular;  Laterality: N/A;   Social History   Occupational History   Occupation: retired  Tobacco Use   Smoking status: Former    Packs/day: 1.00    Years: 20.00    Additional pack years: 0.00    Total pack years: 20.00    Types: Cigarettes    Quit date: 06/09/1987    Years since quitting: 35.5    Passive exposure: Past   Smokeless tobacco: Never   Tobacco comments:    Former smoker 12/25/21  Vaping Use   Vaping  Use: Never used  Substance and Sexual Activity   Alcohol use: No    Alcohol/week: 0.0 standard drinks of alcohol   Drug use: No   Sexual activity: Not Currently

## 2022-12-07 ENCOUNTER — Other Ambulatory Visit (HOSPITAL_COMMUNITY): Payer: Self-pay

## 2022-12-07 MED ORDER — LOSARTAN POTASSIUM 25 MG PO TABS: ORAL_TABLET | ORAL | 3 refills | Status: DC

## 2022-12-08 ENCOUNTER — Telehealth (HOSPITAL_COMMUNITY): Payer: Self-pay | Admitting: Cardiology

## 2022-12-08 NOTE — Telephone Encounter (Signed)
Service Recovery Patient called to request follow up appointment with Dr Gala Romney  Advised appts are full with provider at the moment -offered APP appt -offered [facility] appt  Pt declined both  Reports this is "insanity". She had to wait over 8 months to see Dr Gala Romney last year and feeling like she will have to do the same this year. Unsure why this office holds the schedules they way they do. Reports this is not right for someone with only 20% heart function.  Reports she was told to call in July for Aug/Sept appt and unable to schedule at the moment. Reports she will call everyday until someone cancels

## 2022-12-18 ENCOUNTER — Telehealth: Payer: Self-pay | Admitting: Pulmonary Disease

## 2022-12-18 NOTE — Telephone Encounter (Signed)
If BNP is normal & she has not gained weight, then unlikely to be heart failure Seems like she has had multiple rounds of ABx CXR 6/21 noted Triage,Can we try to get her in with OV with APP plesae?

## 2022-12-18 NOTE — Telephone Encounter (Signed)
-----   Message from Lurline Idol sent at 12/18/2022  2:05 PM EDT ----- Regarding: Dyspnea Dr Vassie Loll, Ms Perri had been good until May.  Then she started having increased dyspnea, wheezing and DOE.  She was treated with Cefdinir with no improvement and I think after that you gave her 7 days Augmentin she got some improvement then. After another 3 weeks she became ill again with similar symptoms.  I checked a BNP which was normal and CXR showed some infiltrates on the LLL with a slightly elevated WBC.  I treated her with a full 10 day course of Augmentin and a Prednisone burst.  She improved again and had gotten her O2 to 4L that was June 20th.  Home Covid test negative today. Has had to increase her O2 to 5L and is very dyspneic with minimal exertion, weight is down and she has been unable to tolerate her BI-Pap saying she feels as if she is smothering.  I don't think she is currently in distress but she definitely has a coarse bronchitic cough.  I have encouraged her to call and make appointment with you or go to the ER if she gets too SOB.  Any suggestions?  Respectfully, Joycelyn Man FNP-C [facility]

## 2022-12-21 ENCOUNTER — Other Ambulatory Visit (HOSPITAL_COMMUNITY): Payer: Self-pay

## 2022-12-21 ENCOUNTER — Ambulatory Visit: Payer: Medicare Other | Attending: Cardiology

## 2022-12-21 ENCOUNTER — Ambulatory Visit (INDEPENDENT_AMBULATORY_CARE_PROVIDER_SITE_OTHER): Payer: Medicare Other | Admitting: Pulmonary Disease

## 2022-12-21 ENCOUNTER — Ambulatory Visit (HOSPITAL_BASED_OUTPATIENT_CLINIC_OR_DEPARTMENT_OTHER): Payer: Medicare Other

## 2022-12-21 ENCOUNTER — Encounter (HOSPITAL_BASED_OUTPATIENT_CLINIC_OR_DEPARTMENT_OTHER): Payer: Self-pay | Admitting: Pulmonary Disease

## 2022-12-21 VITALS — BP 114/66 | HR 77 | Temp 98.2°F | Ht 60.0 in | Wt 190.4 lb

## 2022-12-21 DIAGNOSIS — I5022 Chronic systolic (congestive) heart failure: Secondary | ICD-10-CM | POA: Diagnosis not present

## 2022-12-21 DIAGNOSIS — R059 Cough, unspecified: Secondary | ICD-10-CM | POA: Diagnosis not present

## 2022-12-21 DIAGNOSIS — Z950 Presence of cardiac pacemaker: Secondary | ICD-10-CM

## 2022-12-21 MED ORDER — PREDNISONE 10 MG PO TABS: ORAL_TABLET | ORAL | 0 refills | Status: AC

## 2022-12-21 MED ORDER — CEFPODOXIME PROXETIL 200 MG PO TABS: 200.0000 mg | ORAL_TABLET | Freq: Two times a day (BID) | ORAL | 0 refills | Status: AC

## 2022-12-21 MED ORDER — HYDROCOD POLI-CHLORPHE POLI ER 10-8 MG/5ML PO SUER: 5.0000 mL | Freq: Every evening | ORAL | 0 refills | Status: DC | PRN

## 2022-12-21 NOTE — Telephone Encounter (Signed)
I called and spoke with the pt and scheduled for acute visit with JE- she prefers [facility] and no APPS with openings for next couple wks.

## 2022-12-21 NOTE — Patient Instructions (Signed)
Asthma-COPD exacerbation Possible pneumonia --Prednisone taper ordered --START vantin 200 mg twice a day for five days

## 2022-12-21 NOTE — Telephone Encounter (Signed)
Appt sch for 8/29

## 2022-12-21 NOTE — Progress Notes (Signed)
Subjective:   PATIENT ID: Kathleen [name] GENDER: female DOB: [birth year], MRN: [id number]  Chief Complaint  Patient presents with   Acute Visit    Sob ( 1 week )  pt also has a cough. Using 5L o2     Reason for Visit: Acute visit  [name] is a 77 year old female former smoker with asthma-COPD overlap, tracheobronchomalacia and chronic respiratory failure (3L at rest and 4L activity), NIV followed by Dr. Vassie Loll at [facility] who presents for acute visit.  She is on [place] for maintenace. Last week she has had worsening shortness of breath. She has a Palliative care NP who sees her weekly and recommended to increase to 5L. Any exertion will tire her out and have near syncope. She has began having productive cough associated with some wheezing. Worsened with laying down and having difficulty sleeping. Has not been able to use BiPAP due to symptoms. She is having chills and shakes. Denies fevers.   I have personally reviewed patient's past medical/family/social history, allergies, current medications.  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    Concussion 10/04/2021   COPD (chronic obstructive pulmonary disease) (HCC)    Gout    Hypertension    Longstanding persistent atrial fibrillation (HCC)    on Xarelto   Nonischemic cardiomyopathy (HCC)    Obesity    Pacemaker    Spontaneous pneumothorax 2013     Family History  Problem Relation Age of Onset   Breast cancer Mother 72       3 different times   Pancreatic cancer Mother    Emphysema Father    Healthy Brother    Breast cancer Cousin    Valvular heart disease Son    Obesity Daughter    Colon cancer Neg Hx    Esophageal cancer Neg Hx    Stomach cancer Neg Hx    Inflammatory bowel disease Neg Hx     Liver disease Neg Hx    Rectal cancer Neg Hx      Social History   Occupational History   Occupation: retired  Tobacco Use   Smoking status: Former    Current packs/day: 0.00    Average packs/day: 1 pack/day for 20.0 years (20.0 ttl pk-yrs)    Types: Cigarettes    Start date: 06/09/1967    Quit date: 06/09/1987    Years since quitting: 35.5    Passive exposure: Past   Smokeless tobacco: Never   Tobacco comments:    Former smoker 12/25/21  Vaping Use   Vaping status: Never Used  Substance and Sexual Activity   Alcohol use: No    Alcohol/week: 0.0 standard drinks of alcohol   Drug use: No   Sexual activity: Not Currently    Allergies  Allergen Reactions   Allopurinol Other (See Comments)    Reaction:  Dizziness    Clindamycin Anaphylaxis and Hives   Flublok [Influenza Vaccine Recombinant] Other (See Comments)    Fever 103 with no alternative explanation day after vaccine.  Clydie Braun Highfill FNP-C   Pneumococcal 13-Val Conj Vacc Itching, Swelling and Rash   Dronedarone Rash   Brovana [Arformoterol]     Caused muscle pain   Budesonide     Caused extreme joint pain  Entresto [Sacubitril-Valsartan] Other (See Comments)    hypotension   Fosamax [Alendronate Sodium] Nausea Only   Jardiance [Empagliflozin]     Caused a vaginal infection   Meperidine Nausea And Vomiting   Microplegia Msa-Msg [Plegisol]     Loopy,diarrhea   Rosuvastatin Other (See Comments)    Reaction:  Muscle spasms    Tetracycline Hives   Adhesive [Tape] Rash   Lovastatin Rash and Other (See Comments)    Muscle Pain     Outpatient Medications Prior to Visit  Medication Sig Dispense Refill   acetaminophen (TYLENOL) 650 MG CR tablet Take 1,300 mg by mouth every 8 (eight) hours as needed for pain.     albuterol (PROVENTIL) (2.5 MG/3ML) 0.083% nebulizer solution Take 2.5 mg by nebulization every 6 (six) hours as needed for wheezing or shortness of breath.     albuterol (VENTOLIN HFA) 108 (90 Base) MCG/ACT  inhaler Inhale 2 puffs into the lungs every 4 (four) hours as needed for wheezing or shortness of breath. 8 g 2   azelastine (ASTELIN) 0.1 % nasal spray Place 2 sprays into both nostrils 2 (two) times daily. 90 mL 0   Cholecalciferol (VITAMIN D3) 50 MCG (2000 UT) TABS Take 2,000 Units by mouth at bedtime.     cyanocobalamin (VITAMIN B12) 1000 MCG/ML injection Inject 1 mL (1,000 mcg total) into the muscle every 30 (thirty) days. 10 mL 0   cyclobenzaprine (FLEXERIL) 5 MG tablet TAKE 1 TABLET BY MOUTH AT BEDTIME AS NEEDED FOR LATERAL HIP PAIN 30 tablet 0   docusate sodium (COLACE) 100 MG capsule Take 100 mg by mouth 2 (two) times daily.     dofetilide (TIKOSYN) 250 MCG capsule Take 1 capsule (250 mcg total) by mouth in the morning and at bedtime. 60 capsule 6   febuxostat (ULORIC) 40 MG tablet Take 1 tablet (40 mg total) by mouth daily. 90 tablet 0   fluticasone furoate-vilanterol (BREO ELLIPTA) 100-25 MCG/ACT AEPB Inhale 1 puff into the lungs daily. 30 each 5   gabapentin (NEURONTIN) 100 MG capsule Take 2 capsules (200 mg total) by mouth at bedtime. 60 capsule 6   guaiFENesin (MUCINEX) 600 MG 12 hr tablet Take 600 mg by mouth 2 (two) times daily.     linaclotide (LINZESS) 145 MCG CAPS capsule Take 1 capsule (145 mcg total) by mouth daily as needed (constipation). 90 capsule 0   losartan (COZAAR) 25 MG tablet TAKE 1 TABLET(25 MG) BY MOUTH AT BEDTIME 90 tablet 3   Magnesium 500 MG TABS Take 500 mg by mouth at bedtime.     NEXLETOL 180 MG TABS TAKE 1 TABLET BY MOUTH DAILY 90 tablet 0   nystatin cream (MYCOSTATIN) Apply to affected area 2 times daily 90 g 1   OXYGEN Inhale 4 L into the lungs continuous. Use with resmed ventilator     revefenacin (YUPELRI) 175 MCG/3ML nebulizer solution Take 3 mLs (175 mcg total) by nebulization daily. 90 mL 11   spironolactone (ALDACTONE) 25 MG tablet TAKE 1 TABLET(25 MG) BY MOUTH EVERY MORNING 90 tablet 3   torsemide (DEMADEX) 20 MG tablet Take 1 tablet (20 mg total)  by mouth daily. 90 tablet 2   traMADol (ULTRAM) 50 MG tablet Take 1 tablet (50 mg total) by mouth every 6 (six) hours as needed. 360 tablet 1   XARELTO 20 MG TABS tablet TAKE 1 TABLET(20 MG) BY MOUTH DAILY WITH SUPPER 30 tablet 5   potassium chloride (KLOR-CON) 10 MEQ tablet Take 1 tablet (10  mEq total) by mouth daily. 90 tablet 3   No facility-administered medications prior to visit.    Review of Systems  Constitutional:  Positive for chills and malaise/fatigue. Negative for diaphoresis, fever and weight loss.  HENT:  Negative for congestion.   Respiratory:  Positive for cough, sputum production, shortness of breath and wheezing. Negative for hemoptysis.   Cardiovascular:  Negative for chest pain, palpitations and leg swelling.     Objective:   Vitals:   12/21/22 1457  BP: 114/66  Pulse: 77  Temp: 98.2 F (36.8 C)  TempSrc: Oral  SpO2: 96%  Weight: 86.4 kg  Height: 5' (1.524 m)   SpO2: 96 % on 4L  Physical Exam: General: Chronically ill appearing-appearing, no acute distress HENT: [place], AT Eyes: EOMI, no scleral icterus Respiratory: Diminished bases.  No crackles, wheezing or rales Cardiovascular: RRR, -M/R/G, no JVD Extremities:-Edema,-tenderness Neuro: AAO x4, CNII-XII grossly intact Psych: Normal mood, normal affect  Data Reviewed:  Imaging: CXR 12/21/22 Increased right interstitial markings  PFT: None on file  Labs:    Latest Ref Rng & Units 09/15/2022   11:34 AM 05/22/2022    9:01 AM 05/19/2022   12:11 PM  CBC  WBC 4.0 - 10.5 K/uL 10.0  10.1  11.1   Hemoglobin 12.0 - 15.0 g/dL 40.9  81.1  91.4   Hematocrit 36.0 - 46.0 % 40.8  41.4  40.0   Platelets 150 - 400 K/uL 274  234.0  209.0         Assessment & Plan:   Discussion: 77 year old female former smoker with asthma-COPD overlap, tracheobronchomalacia and chronic respiratory failure (3L at rest and 4L activity), NIV followed by Dr. Vassie Loll at [facility] who presents for acute visit. In  exacerbation. CXR with possible perihilar infiltrate on right side.   Asthma-COPD exacerbation Possible pneumonia --Prednisone taper ordered --START vantin 200 mg twice a day for five days --Cough syrup ordered  Orders Placed This Encounter  Procedures   DG Chest Portable 2 Views    Standing Status:   Future    Standing Expiration Date:   12/21/2023    Order Specific Question:   Reason for Exam (SYMPTOM  OR DIAGNOSIS REQUIRED)    Answer:   cough, chills    Order Specific Question:   Preferred imaging location?    Answer:   [facility]   DG Chest 2 View    Standing Status:   Future    Number of Occurrences:   1    Standing Expiration Date:   12/21/2023    Order Specific Question:   Reason for Exam (SYMPTOM  OR DIAGNOSIS REQUIRED)    Answer:   cough    Order Specific Question:   Preferred imaging location?    Answer:   [facility]   Meds ordered this encounter  Medications   predniSONE (DELTASONE) 10 MG tablet    Sig: Take 4 tablets (40 mg total) by mouth daily with breakfast for 2 days, THEN 3 tablets (30 mg total) daily with breakfast for 2 days, THEN 2 tablets (20 mg total) daily with breakfast for 2 days, THEN 1 tablet (10 mg total) daily with breakfast for 2 days.    Dispense:  20 tablet    Refill:  0   cefpodoxime (VANTIN) 200 MG tablet    Sig: Take 1 tablet (200 mg total) by mouth 2 (two) times daily for 5 days.    Dispense:  10 tablet    Refill:  0    No follow-ups on file. Follow-up with Dr. Vassie Loll  I have spent a total time of 30-minutes on the day of the appointment reviewing prior documentation, coordinating care and discussing medical diagnosis and plan with the patient/family. Imaging, labs and tests included in this note have been reviewed and interpreted independently by me.  Legna Mausolf Mechele Collin, MD [facility] 12/21/2022 3:54 PM  Office Number [phone]

## 2022-12-21 NOTE — Telephone Encounter (Signed)
Patient checking on message from Dr. Vassie Loll. Patient phone number is [phone].

## 2022-12-23 ENCOUNTER — Telehealth: Payer: Self-pay

## 2022-12-23 NOTE — Progress Notes (Signed)
EPIC Encounter for ICM Monitoring  Patient Name: Kathleen [name] is a 77 y.o. female Date: 12/23/2022 Primary Care Physican: [name], MD Primary Cardiologist: Bensimhon Electrophysiologist: Kathreen Cornfield Pacing: 100%          07/28/2022 Weight: 184.6 lbs 09/07/2022   Weight: 187 lbs 11/17/2022 Weight: 190-191 lbs                                                     Attempted call to patient and unable to reach.  Left detailed message per DPR regarding transmission. Transmission reviewed.    Diet:  She lives in an assisted living  Eats foods that are prepared at the facility she lives in which are not low salt.     Optivol thoracic impedance suggesting normal fluid levels with the exception of possible fluid accumulation from 6/11-6/27   Prescribed:  Torsemide 20 mg take 1 tablet(s) (20 mg total) by mouth daily.   Potassium 10 mEq take 1 tablet (10 mEq total) by mouth daily  Spironolactone 25 mg take 1 tablet daily   Labs: 08/18/2022 Creatinine 0.8,   BUN 24, Potassium 4.3, Sodium 142, GFR 77 08/03/2022 Creatinine 0.80, BUN 22, Potassium 3.8, Sodium 138, GFR >60 05/19/2022 Creatinine 0.72, BUN 16, Potassium 4.5, Sodium 140  A complete set of results can be found in Results Review.   Recommendations:  Left voice mail with ICM number and encouraged to call if experiencing any fluid symptoms.   Follow-up plan: ICM clinic phone appointment on 01/25/2023.  91 day device clinic remote transmission 12/30/2022.     EP/Cardiology Office Visits:    02/04/2023 with Dr Gala Romney.  Recall 05/13/2023 with Francis Dowse, PA or Otilio Saber, [place].     Copy of ICM check sent to Dr. Elberta Fortis.   3 month ICM trend: 12/21/2022.    12-14 Month ICM trend:     Karie Soda, RN 12/23/2022 2:12 PM

## 2022-12-23 NOTE — Telephone Encounter (Signed)
Remote ICM transmission received.  Attempted call to patient regarding ICM remote transmission and left detailed message per DPR.  Left ICM phone number and advised to return call for any fluid symptoms or questions. Next ICM remote transmission scheduled 01/25/2023.

## 2022-12-27 ENCOUNTER — Encounter (HOSPITAL_BASED_OUTPATIENT_CLINIC_OR_DEPARTMENT_OTHER): Payer: Self-pay | Admitting: Pulmonary Disease

## 2022-12-29 ENCOUNTER — Other Ambulatory Visit: Payer: Self-pay | Admitting: Internal Medicine

## 2022-12-29 MED ORDER — CEFPODOXIME PROXETIL 200 MG PO TABS: 200.0000 mg | ORAL_TABLET | Freq: Two times a day (BID) | ORAL | 0 refills | Status: AC

## 2022-12-29 MED ORDER — PREDNISONE 10 MG PO TABS: ORAL_TABLET | ORAL | 0 refills | Status: AC

## 2022-12-29 NOTE — Telephone Encounter (Signed)
Last Fill: 09/30/2022   Labs: 09/15/2022 [facility] BMP WNL  Next Visit: 01/21/2023  Last Visit: 07/23/2022  DX:  Acute idiopathic gout of left hand   Current Dose per office note 07/23/2022: Uloric 40 mg daily   Okay to refill Uloric?

## 2022-12-30 ENCOUNTER — Ambulatory Visit: Payer: Medicare Other

## 2022-12-30 DIAGNOSIS — I442 Atrioventricular block, complete: Secondary | ICD-10-CM

## 2022-12-31 LAB — CUP PACEART REMOTE DEVICE CHECK
Battery Remaining Longevity: 88 mo
Battery Voltage: 2.98 V
Brady Statistic AP VP Percent: 86.63 %
Brady Statistic AP VS Percent: 0.03 %
Brady Statistic AS VP Percent: 13.32 %
Brady Statistic RA Percent Paced: 86.54 %
Brady Statistic RV Percent Paced: 99.95 %
Date Time Interrogation Session: 20240724021837
Implantable Lead Connection Status: 753985
Implantable Lead Connection Status: 753985
Implantable Lead Implant Date: 20150527
Implantable Lead Location: 753858
Implantable Lead Location: 753860
Implantable Lead Model: 3830
Implantable Lead Model: 5076
Implantable Pulse Generator Implant Date: 20211221
Lead Channel Impedance Value: 266 Ohm
Lead Channel Impedance Value: 342 Ohm
Lead Channel Impedance Value: 342 Ohm
Lead Channel Impedance Value: 342 Ohm
Lead Channel Impedance Value: 361 Ohm
Lead Channel Impedance Value: 437 Ohm
Lead Channel Impedance Value: 475 Ohm
Lead Channel Impedance Value: 475 Ohm
Lead Channel Impedance Value: 532 Ohm
Lead Channel Pacing Threshold Amplitude: 0.625 V
Lead Channel Pacing Threshold Amplitude: 0.625 V
Lead Channel Pacing Threshold Amplitude: 0.625 V
Lead Channel Pacing Threshold Pulse Width: 0.4 ms
Lead Channel Pacing Threshold Pulse Width: 0.4 ms
Lead Channel Sensing Intrinsic Amplitude: 1.5 mV
Lead Channel Sensing Intrinsic Amplitude: 1.5 mV
Lead Channel Sensing Intrinsic Amplitude: 6.25 mV
Lead Channel Sensing Intrinsic Amplitude: 6.25 mV
Lead Channel Setting Pacing Amplitude: 1.25 V
Lead Channel Setting Pacing Amplitude: 2 V
Lead Channel Setting Pacing Pulse Width: 0.4 ms
Lead Channel Setting Pacing Pulse Width: 0.4 ms
Lead Channel Setting Sensing Sensitivity: 2 mV
Zone Setting Status: 755011

## 2023-01-08 ENCOUNTER — Telehealth: Payer: Self-pay | Admitting: Pulmonary Disease

## 2023-01-08 NOTE — Telephone Encounter (Signed)
See 7/15 mychart encounter. Patient is still not feeling well despite taking round of antibiotics. Secure chat sent to [facility] to see if we can use a hold spot next week to evaluate patient. No acutes available in [facility] or [facility] for weeks. Will wait for [facility] response to see what to do.

## 2023-01-08 NOTE — Progress Notes (Deleted)
Office Visit Note  Patient: Kathleen [name]             Date of Birth: [birth year]           MRN: [id number]             PCP: [name], MD Referring: [name], MD Visit Date: 01/21/2023   Subjective:  No chief complaint on file.   History of Present Illness: Kathleen [name] is a 77 y.o. 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    Previous HPI 07/23/2022 Kathleen [name] is a 77 y.o. 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  She has not had any new flareups since her last visit.  Recently had some additional adjustments to her diuretic medication due to increase in volume overload and oxygen requirement.  For about the past 3 weeks is also having increase in left shoulder pain with decreased mobility.  This is limiting her quite a bit since the right shoulder has little use due to full-thickness tendon tears for which she is not a surgical candidate to repair due to severe cardiopulmonary disease.  Less severe is having pain in bilateral hips this frequently bothers her lying in bed at night or if she is seated in fixed position for more than about half an hour.   Previous HPI 01/19/22 Kathleen [name] is a 77 y.o. female here for evaluation and management of chronic gout.  She has had gouty arthritis ongoing for a few years now most often affecting the right foot.  She saw a rheumatology provider in [place] for management more recently just being treated with her primary care office.  She has previously taken allopurinol stopped due to concern about allergic reaction.  She was on maintenance colchicine for flare prevention but had stopped this due to lack of efficacy.  More recently was prescribed colchicine during severe flareup of pain in her left hand with no obvious clinical response.  She was treated with probenecid apparently stopped due to poor clinical response, not sure if this was uric acid or other parameters.  More  recently appears to have been treated with uloric since 2020 but with increased frequency and duration of gout flares in the past year. She was admitted to the hospital in December apparently with sepsis secondary to some chest wall cellulitis.  During his hospital stay she developed right and then the left-sided foot swelling and severe pain.  She also had a recurrence of the right foot symptoms within a few days after hospital discharge.  This was treated with prednisone with clinical improvement.  Symptoms remained okay with her feet but in June developed sudden left fourth PIP joint pain swelling and erythema.  This was thought to represent a new gout flare and treated with oral colchicine.  Symptoms are ongoing for about a month before resolving on their own she did not see any difference when taking the medicine.  She has been taking her subluxes at 40 mg daily consistently throughout this time.  Right now joints are pretty much all back to baseline.   No Rheumatology ROS completed.   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   Depression, major, single episode, moderate (HCC) 04/27/2022   Non-STEMI (non-ST elevated myocardial infarction) (HCC) 03/18/2022   Hypotension 03/18/2022   Morbid obesity (HCC) 03/18/2022   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   Low TSH level 12/02/2021   Acute idiopathic gout of left hand  11/19/2021   Acquired hypothyroidism 11/12/2021   Encounter for palliative care involving management of pain 11/12/2021   Post concussive syndrome 11/11/2021   Chronic left-sided low back pain 11/04/2021   Gout 10/01/2021   Hyperlipidemia with target LDL less than 100 08/07/2021   Chronic anticoagulation 07/07/2021   Stage 3a chronic kidney disease (HCC) 05/23/2021   Dietary iron deficiency 05/23/2021   Age-related osteoporosis without current pathological fracture 04/22/2021   Type 2 diabetes mellitus with diabetic neuropathy, without long-term current use of insulin (HCC) 04/22/2021   Sleep apnea 01/22/2021   Statin intolerance 10/12/2019   Constipation    Statin myopathy 02/24/2019   Chronic systolic heart failure (HCC) 04/06/2018   HTN (hypertension) 04/06/2018   Atrial fibrillation (HCC) 04/06/2018   Chronic respiratory failure with hypoxia (HCC) 03/28/2018   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 12/02/2015   Malnutrition of moderate degree 09/11/2015    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    Concussion 10/04/2021   COPD (chronic obstructive pulmonary disease) (HCC)    Gout    Hypertension    Longstanding persistent atrial fibrillation (HCC)    on Xarelto   Nonischemic cardiomyopathy (HCC)    Obesity    Pacemaker    Spontaneous pneumothorax 2013    Family History  Problem Relation Age of Onset   Breast cancer Mother 6       3 different times   Pancreatic cancer Mother    Emphysema Father    Healthy Brother    Breast cancer Cousin    Valvular heart disease Son    Obesity Daughter    Colon cancer Neg Hx    Esophageal cancer Neg Hx    Stomach cancer Neg Hx    Inflammatory bowel disease Neg Hx    Liver disease Neg Hx    Rectal cancer Neg  Hx    Past Surgical History:  Procedure Laterality Date   ABDOMINAL HYSTERECTOMY     APPENDECTOMY     ATRIAL FIBRILLATION ABLATION  07/20/2013   by Dr Christin Fudge   AV nodal ablation  11/01/2013   by Dr Christin Fudge, repeated by Dr Wilford Grist   BREAST BIOPSY Bilateral 1997   negative   CARDIAC CATHETERIZATION     CHOLECYSTECTOMY     HERNIA REPAIR     PACEMAKER INSERTION  06/2017   MDT Viva CRT-P implanted by Dr Christin Fudge after AV nodal ablation,  LV lead could not be placed   PACEMAKER INSERTION  05/2020   with lead bundle   RIGHT/LEFT HEART CATH AND CORONARY ANGIOGRAPHY N/A 03/20/2022   Procedure: RIGHT/LEFT HEART CATH AND CORONARY ANGIOGRAPHY;  Surgeon: [place], Peter M, MD;  Location: [facility] INVASIVE CV LAB;  Service: Cardiovascular;  Laterality: N/A;   Social History   Social History Narrative   Pt lives in [place] alone.  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  Her son works in [employer].   Immunization History  Administered Date(s) Administered   Fluad Quad(high Dose 65+) 04/22/2021, 03/16/2022   Hepatitis A, Adult 04/12/2001   Influenza, High Dose Seasonal PF 03/16/2019   PFIZER Comirnaty(Gray Top)Covid-19 Tri-Sucrose Vaccine 03/29/2020, 10/09/2020   PFIZER(Purple Top)SARS-COV-2 Vaccination 07/13/2019, 08/08/2019   Pneumococcal Conjugate-13 08/30/2014   Pneumococcal Polysaccharide-23 03/31/2019   Tdap 10/02/2013   Yellow Fever 04/12/2001     Objective: Vital Signs: There were no vitals taken for this visit.   Physical Exam   Musculoskeletal Exam: ***  CDAI Exam: CDAI Score: -- Patient Global: --; Provider Global: -- Swollen: --; Tender: -- Joint Exam 01/21/2023   No joint exam has been documented for this visit   There is currently no information documented on the homunculus. Go to the Rheumatology activity and complete the homunculus joint exam.  Investigation: No additional findings.  Imaging: CUP PACEART REMOTE DEVICE CHECK  Result Date:  12/31/2022 Scheduled remote reviewed. Normal device function.  Next remote 91 days. LA, CVRS  DG Chest 2 View  Result Date: 12/28/2022 CLINICAL DATA:  Cough.  History of COPD, CHF and hypertension. EXAM: CHEST - 2 VIEW COMPARISON:  11/27/2022 FINDINGS: Stable mildly enlarged heart. Tortuous and partially calcified thoracic aorta. Small amount of linear density at both lung bases. No airspace consolidation suspicious for pneumonia. Left subclavian pacemaker with 3 leads, 1 with its tip in the right atrium and 2 with their tips in the right ventricle. Small left atrial appendage device. Thoracic spine degenerative changes. Cholecystectomy clips. Moderate bilateral shoulder degenerative changes with superior migration of the right humeral head and bony remodeling of the humeral head and adjacent acromion, compatible with a large, chronic rotator cuff tear. IMPRESSION: 1. Mild bibasilar linear atelectasis. 2. Stable mild cardiomegaly. 3. No evidence of pneumonia. Electronically Signed   By: Beckie Salts M.D.   On: 12/28/2022 13:45    Recent Labs: Lab Results  Component Value Date   WBC 10.0 09/15/2022   HGB 12.9 09/15/2022   PLT 274 09/15/2022   NA 139 09/15/2022   K 4.3 09/15/2022   CL 98 09/15/2022   CO2 29 09/15/2022   GLUCOSE 99 09/15/2022   BUN 19 09/15/2022   CREATININE 0.84 09/15/2022   BILITOT 0.6 04/28/2022   ALKPHOS 26 (L) 04/28/2022   AST 14 (L) 04/28/2022   ALT 14 04/28/2022   PROT 6.6 04/28/2022   ALBUMIN 4.1 04/28/2022   CALCIUM 10.1 09/15/2022   GFRAA 58 (L) 06/28/2019    Speciality Comments: No specialty comments available.  Procedures:  No procedures performed Allergies: Allopurinol, Clindamycin, Flublok [influenza vaccine recombinant], Pneumococcal 13-val conj vacc, Dronedarone, Brovana [arformoterol], Budesonide, Entresto [sacubitril-valsartan], Fosamax [alendronate sodium], Jardiance [empagliflozin], Meperidine, Microplegia msa-msg [plegisol], Rosuvastatin,  Tetracycline, Adhesive [tape], and Lovastatin   Assessment / Plan:     Visit Diagnoses: No diagnosis found.  ***  Orders: No orders of the defined types were placed in this encounter.  No orders of the defined types were placed in this encounter.    Follow-Up Instructions: No follow-ups on file.   Metta Clines, RT  Note - This record has been created using [employer].  Chart creation errors have been sought, but may not always  have been located. Such creation errors do not reflect on  the standard of medical care.

## 2023-01-11 NOTE — Telephone Encounter (Signed)
Per patient's chart, she was scheduled for an acute visit with Dr. Everardo All on 8/7 at 230pm.   Will close encounter.

## 2023-01-12 NOTE — Progress Notes (Signed)
Remote pacemaker transmission.   

## 2023-01-13 ENCOUNTER — Emergency Department (HOSPITAL_BASED_OUTPATIENT_CLINIC_OR_DEPARTMENT_OTHER): Payer: Medicare Other | Admitting: Radiology

## 2023-01-13 ENCOUNTER — Encounter (HOSPITAL_BASED_OUTPATIENT_CLINIC_OR_DEPARTMENT_OTHER): Payer: Self-pay | Admitting: Pulmonary Disease

## 2023-01-13 ENCOUNTER — Ambulatory Visit (INDEPENDENT_AMBULATORY_CARE_PROVIDER_SITE_OTHER): Payer: Medicare Other | Admitting: Pulmonary Disease

## 2023-01-13 ENCOUNTER — Inpatient Hospital Stay (HOSPITAL_BASED_OUTPATIENT_CLINIC_OR_DEPARTMENT_OTHER)
Admission: EM | Admit: 2023-01-13 | Discharge: 2023-01-19 | DRG: 190 | Disposition: A | Discharge status: Home Health | Payer: Medicare Other | Attending: Internal Medicine | Admitting: Internal Medicine

## 2023-01-13 ENCOUNTER — Other Ambulatory Visit: Payer: Self-pay

## 2023-01-13 ENCOUNTER — Encounter (HOSPITAL_BASED_OUTPATIENT_CLINIC_OR_DEPARTMENT_OTHER): Payer: Self-pay | Admitting: Emergency Medicine

## 2023-01-13 VITALS — BP 136/68 | HR 63 | Resp 16 | Ht 60.0 in | Wt 194.8 lb

## 2023-01-13 DIAGNOSIS — Z881 Allergy status to other antibiotic agents status: Secondary | ICD-10-CM

## 2023-01-13 DIAGNOSIS — E1140 Type 2 diabetes mellitus with diabetic neuropathy, unspecified: Secondary | ICD-10-CM | POA: Diagnosis present

## 2023-01-13 DIAGNOSIS — E669 Obesity, unspecified: Secondary | ICD-10-CM | POA: Diagnosis present

## 2023-01-13 DIAGNOSIS — E1122 Type 2 diabetes mellitus with diabetic chronic kidney disease: Secondary | ICD-10-CM | POA: Diagnosis present

## 2023-01-13 DIAGNOSIS — G4730 Sleep apnea, unspecified: Secondary | ICD-10-CM | POA: Diagnosis present

## 2023-01-13 DIAGNOSIS — Z800 Family history of malignant neoplasm of digestive organs: Secondary | ICD-10-CM

## 2023-01-13 DIAGNOSIS — J441 Chronic obstructive pulmonary disease with (acute) exacerbation: Principal | ICD-10-CM | POA: Diagnosis present

## 2023-01-13 DIAGNOSIS — Z90710 Acquired absence of both cervix and uterus: Secondary | ICD-10-CM

## 2023-01-13 DIAGNOSIS — I4811 Longstanding persistent atrial fibrillation: Secondary | ICD-10-CM | POA: Diagnosis present

## 2023-01-13 DIAGNOSIS — E785 Hyperlipidemia, unspecified: Secondary | ICD-10-CM | POA: Diagnosis present

## 2023-01-13 DIAGNOSIS — Z1152 Encounter for screening for COVID-19: Secondary | ICD-10-CM

## 2023-01-13 DIAGNOSIS — J4489 Other specified chronic obstructive pulmonary disease: Secondary | ICD-10-CM | POA: Diagnosis not present

## 2023-01-13 DIAGNOSIS — Z6837 Body mass index (BMI) 37.0-37.9, adult: Secondary | ICD-10-CM | POA: Diagnosis not present

## 2023-01-13 DIAGNOSIS — E1165 Type 2 diabetes mellitus with hyperglycemia: Secondary | ICD-10-CM | POA: Diagnosis present

## 2023-01-13 DIAGNOSIS — I5022 Chronic systolic (congestive) heart failure: Secondary | ICD-10-CM | POA: Diagnosis present

## 2023-01-13 DIAGNOSIS — I130 Hypertensive heart and chronic kidney disease with heart failure and stage 1 through stage 4 chronic kidney disease, or unspecified chronic kidney disease: Secondary | ICD-10-CM | POA: Diagnosis present

## 2023-01-13 DIAGNOSIS — Z825 Family history of asthma and other chronic lower respiratory diseases: Secondary | ICD-10-CM

## 2023-01-13 DIAGNOSIS — E039 Hypothyroidism, unspecified: Secondary | ICD-10-CM | POA: Diagnosis present

## 2023-01-13 DIAGNOSIS — Z713 Dietary counseling and surveillance: Secondary | ICD-10-CM

## 2023-01-13 DIAGNOSIS — Z87891 Personal history of nicotine dependence: Secondary | ICD-10-CM

## 2023-01-13 DIAGNOSIS — J9621 Acute and chronic respiratory failure with hypoxia: Secondary | ICD-10-CM | POA: Diagnosis present

## 2023-01-13 DIAGNOSIS — J9611 Chronic respiratory failure with hypoxia: Secondary | ICD-10-CM | POA: Diagnosis present

## 2023-01-13 DIAGNOSIS — I442 Atrioventricular block, complete: Secondary | ICD-10-CM | POA: Diagnosis present

## 2023-01-13 DIAGNOSIS — Z7901 Long term (current) use of anticoagulants: Secondary | ICD-10-CM

## 2023-01-13 DIAGNOSIS — Z9049 Acquired absence of other specified parts of digestive tract: Secondary | ICD-10-CM

## 2023-01-13 DIAGNOSIS — I5042 Chronic combined systolic (congestive) and diastolic (congestive) heart failure: Secondary | ICD-10-CM | POA: Diagnosis present

## 2023-01-13 DIAGNOSIS — J9811 Atelectasis: Secondary | ICD-10-CM | POA: Diagnosis present

## 2023-01-13 DIAGNOSIS — Z950 Presence of cardiac pacemaker: Secondary | ICD-10-CM

## 2023-01-13 DIAGNOSIS — I4891 Unspecified atrial fibrillation: Secondary | ICD-10-CM | POA: Diagnosis present

## 2023-01-13 DIAGNOSIS — Z803 Family history of malignant neoplasm of breast: Secondary | ICD-10-CM

## 2023-01-13 DIAGNOSIS — I428 Other cardiomyopathies: Secondary | ICD-10-CM | POA: Diagnosis present

## 2023-01-13 DIAGNOSIS — Z9981 Dependence on supplemental oxygen: Secondary | ICD-10-CM

## 2023-01-13 DIAGNOSIS — Z79890 Hormone replacement therapy (postmenopausal): Secondary | ICD-10-CM

## 2023-01-13 DIAGNOSIS — Z79899 Other long term (current) drug therapy: Secondary | ICD-10-CM

## 2023-01-13 DIAGNOSIS — K5900 Constipation, unspecified: Secondary | ICD-10-CM | POA: Diagnosis not present

## 2023-01-13 DIAGNOSIS — Z888 Allergy status to other drugs, medicaments and biological substances status: Secondary | ICD-10-CM

## 2023-01-13 DIAGNOSIS — N1831 Chronic kidney disease, stage 3a: Secondary | ICD-10-CM | POA: Diagnosis present

## 2023-01-13 DIAGNOSIS — I10 Essential (primary) hypertension: Secondary | ICD-10-CM | POA: Diagnosis present

## 2023-01-13 LAB — CBC WITH DIFFERENTIAL/PLATELET
Abs Immature Granulocytes: 0.17 10*3/uL — ABNORMAL HIGH (ref 0.00–0.07)
Basophils Absolute: 0.1 10*3/uL (ref 0.0–0.1)
Basophils Relative: 1 %
Eosinophils Absolute: 0 10*3/uL (ref 0.0–0.5)
Eosinophils Relative: 0 %
HCT: 34.8 % — ABNORMAL LOW (ref 36.0–46.0)
Hemoglobin: 11.5 g/dL — ABNORMAL LOW (ref 12.0–15.0)
Immature Granulocytes: 2 %
Lymphocytes Relative: 6 %
Lymphs Abs: 0.6 10*3/uL — ABNORMAL LOW (ref 0.7–4.0)
MCH: 28.2 pg (ref 26.0–34.0)
MCHC: 33 g/dL (ref 30.0–36.0)
MCV: 85.3 fL (ref 80.0–100.0)
Monocytes Absolute: 0.4 10*3/uL (ref 0.1–1.0)
Monocytes Relative: 3 %
Neutro Abs: 10.1 10*3/uL — ABNORMAL HIGH (ref 1.7–7.7)
Neutrophils Relative %: 88 %
Platelets: 241 10*3/uL (ref 150–400)
RBC: 4.08 MIL/uL (ref 3.87–5.11)
RDW: 14.8 % (ref 11.5–15.5)
WBC: 11.3 10*3/uL — ABNORMAL HIGH (ref 4.0–10.5)
nRBC: 0 % (ref 0.0–0.2)

## 2023-01-13 LAB — BRAIN NATRIURETIC PEPTIDE: B Natriuretic Peptide: 129.5 pg/mL — ABNORMAL HIGH (ref 0.0–100.0)

## 2023-01-13 LAB — BASIC METABOLIC PANEL
Anion gap: 9 (ref 5–15)
BUN: 22 mg/dL (ref 8–23)
CO2: 24 mmol/L (ref 22–32)
Calcium: 10.5 mg/dL — ABNORMAL HIGH (ref 8.9–10.3)
Chloride: 106 mmol/L (ref 98–111)
Creatinine, Ser: 0.68 mg/dL (ref 0.44–1.00)
GFR, Estimated: >60 mL/min (ref >60)
Glucose, Bld: 156 mg/dL — ABNORMAL HIGH (ref 70–99)
Potassium: 4.5 mmol/L (ref 3.5–5.1)
Sodium: 139 mmol/L (ref 135–145)

## 2023-01-13 LAB — TROPONIN I (HIGH SENSITIVITY)
Troponin I (High Sensitivity): 3 ng/L (ref <18)
Troponin I (High Sensitivity): 3 ng/L (ref <18)

## 2023-01-13 LAB — SARS CORONAVIRUS 2 BY RT PCR: SARS Coronavirus 2 by RT PCR: NEGATIVE

## 2023-01-13 MED ORDER — SPIRONOLACTONE 25 MG PO TABS
25.0000 mg | ORAL_TABLET | Freq: Every day | ORAL | Status: DC
  Administered 2023-01-14 – 2023-01-19 (×6): 25 mg via ORAL
  Filled 2023-01-13 (×6): qty 1

## 2023-01-13 MED ORDER — SODIUM CHLORIDE 0.9 % IV SOLN
1.0000 g | INTRAVENOUS | Status: AC
  Administered 2023-01-14 – 2023-01-18 (×5): 1 g via INTRAVENOUS
  Filled 2023-01-13 (×5): qty 10

## 2023-01-13 MED ORDER — MAGNESIUM OXIDE -MG SUPPLEMENT 400 (240 MG) MG PO TABS
400.0000 mg | ORAL_TABLET | Freq: Every day | ORAL | Status: DC
  Administered 2023-01-14 – 2023-01-18 (×6): 400 mg via ORAL
  Filled 2023-01-13 (×6): qty 1

## 2023-01-13 MED ORDER — ALBUTEROL SULFATE (2.5 MG/3ML) 0.083% IN NEBU
2.5000 mg | INHALATION_SOLUTION | RESPIRATORY_TRACT | Status: DC | PRN
  Administered 2023-01-16: 2.5 mg via RESPIRATORY_TRACT
  Filled 2023-01-13: qty 3

## 2023-01-13 MED ORDER — IPRATROPIUM-ALBUTEROL 0.5-2.5 (3) MG/3ML IN SOLN
3.0000 mL | Freq: Once | RESPIRATORY_TRACT | Status: AC
  Administered 2023-01-13: 3 mL via RESPIRATORY_TRACT
  Filled 2023-01-13: qty 3

## 2023-01-13 MED ORDER — AZELASTINE HCL 0.1 % NA SOLN
2.0000 | Freq: Two times a day (BID) | NASAL | Status: DC
  Administered 2023-01-14 – 2023-01-19 (×11): 2 via NASAL
  Filled 2023-01-13 (×2): qty 30

## 2023-01-13 MED ORDER — ALBUTEROL SULFATE (2.5 MG/3ML) 0.083% IN NEBU: 2.5000 mg | INHALATION_SOLUTION | Freq: Four times a day (QID) | RESPIRATORY_TRACT | Status: DC | PRN

## 2023-01-13 MED ORDER — REVEFENACIN 175 MCG/3ML IN SOLN
175.0000 ug | Freq: Every day | RESPIRATORY_TRACT | Status: DC
  Administered 2023-01-14 – 2023-01-19 (×6): 175 ug via RESPIRATORY_TRACT
  Filled 2023-01-13 (×6): qty 3

## 2023-01-13 MED ORDER — LINACLOTIDE 145 MCG PO CAPS: 145.0000 ug | ORAL_CAPSULE | Freq: Every day | ORAL | Status: DC | PRN

## 2023-01-13 MED ORDER — METHYLPREDNISOLONE SODIUM SUCC 125 MG IJ SOLR
125.0000 mg | Freq: Once | INTRAMUSCULAR | Status: AC
  Administered 2023-01-13: 125 mg via INTRAVENOUS
  Filled 2023-01-13: qty 2

## 2023-01-13 MED ORDER — SODIUM CHLORIDE 0.9 % IV SOLN
500.0000 mg | INTRAVENOUS | Status: DC
  Filled 2023-01-13: qty 5

## 2023-01-13 MED ORDER — GABAPENTIN 100 MG PO CAPS
200.0000 mg | ORAL_CAPSULE | Freq: Every day | ORAL | Status: DC
  Administered 2023-01-14 – 2023-01-18 (×6): 200 mg via ORAL
  Filled 2023-01-13 (×6): qty 2

## 2023-01-13 MED ORDER — ACETAMINOPHEN 325 MG PO TABS: 650.0000 mg | ORAL_TABLET | Freq: Three times a day (TID) | ORAL | Status: DC | PRN

## 2023-01-13 MED ORDER — IPRATROPIUM-ALBUTEROL 0.5-2.5 (3) MG/3ML IN SOLN
3.0000 mL | Freq: Four times a day (QID) | RESPIRATORY_TRACT | Status: DC
  Administered 2023-01-14: 3 mL via RESPIRATORY_TRACT
  Filled 2023-01-13: qty 3

## 2023-01-13 MED ORDER — FEBUXOSTAT 40 MG PO TABS
40.0000 mg | ORAL_TABLET | Freq: Every day | ORAL | Status: DC
  Administered 2023-01-14 – 2023-01-19 (×6): 40 mg via ORAL
  Filled 2023-01-13 (×6): qty 1

## 2023-01-13 MED ORDER — CYCLOBENZAPRINE HCL 5 MG PO TABS
5.0000 mg | ORAL_TABLET | Freq: Every day | ORAL | Status: DC
  Administered 2023-01-14 – 2023-01-18 (×6): 5 mg via ORAL
  Filled 2023-01-13 (×6): qty 1

## 2023-01-13 MED ORDER — FLUTICASONE FUROATE-VILANTEROL 100-25 MCG/ACT IN AEPB
1.0000 | INHALATION_SPRAY | Freq: Every day | RESPIRATORY_TRACT | Status: DC
  Administered 2023-01-14 – 2023-01-16 (×3): 1 via RESPIRATORY_TRACT
  Filled 2023-01-13: qty 28

## 2023-01-13 MED ORDER — LOSARTAN POTASSIUM 50 MG PO TABS
25.0000 mg | ORAL_TABLET | Freq: Every day | ORAL | Status: DC
  Administered 2023-01-14 – 2023-01-18 (×5): 25 mg via ORAL
  Filled 2023-01-13 (×5): qty 1

## 2023-01-13 MED ORDER — VITAMIN D 25 MCG (1000 UNIT) PO TABS
2000.0000 [IU] | ORAL_TABLET | Freq: Every day | ORAL | Status: DC
  Administered 2023-01-14 – 2023-01-18 (×6): 2000 [IU] via ORAL
  Filled 2023-01-13 (×6): qty 2

## 2023-01-13 MED ORDER — TORSEMIDE 20 MG PO TABS
20.0000 mg | ORAL_TABLET | Freq: Every day | ORAL | Status: DC
  Administered 2023-01-14: 20 mg via ORAL
  Filled 2023-01-13: qty 1

## 2023-01-13 MED ORDER — CYANOCOBALAMIN 1000 MCG/ML IJ SOLN: 1000.0000 ug | INTRAMUSCULAR | Status: DC

## 2023-01-13 MED ORDER — BEMPEDOIC ACID 180 MG PO TABS: 1.0000 | ORAL_TABLET | Freq: Every day | ORAL | Status: DC

## 2023-01-13 MED ORDER — RIVAROXABAN 10 MG PO TABS
20.0000 mg | ORAL_TABLET | Freq: Every day | ORAL | Status: DC
  Administered 2023-01-14 – 2023-01-18 (×5): 20 mg via ORAL
  Filled 2023-01-13 (×5): qty 2

## 2023-01-13 MED ORDER — PREDNISONE 20 MG PO TABS
40.0000 mg | ORAL_TABLET | Freq: Every day | ORAL | Status: AC
  Administered 2023-01-15 – 2023-01-18 (×4): 40 mg via ORAL
  Filled 2023-01-13 (×4): qty 2

## 2023-01-13 MED ORDER — GUAIFENESIN ER 600 MG PO TB12
600.0000 mg | ORAL_TABLET | Freq: Two times a day (BID) | ORAL | Status: DC
  Administered 2023-01-14 – 2023-01-19 (×10): 600 mg via ORAL
  Filled 2023-01-13 (×11): qty 1

## 2023-01-13 MED ORDER — METHYLPREDNISOLONE SODIUM SUCC 125 MG IJ SOLR
125.0000 mg | Freq: Two times a day (BID) | INTRAMUSCULAR | Status: AC
  Administered 2023-01-14 (×2): 125 mg via INTRAVENOUS
  Filled 2023-01-13 (×2): qty 2

## 2023-01-13 MED ORDER — DOCUSATE SODIUM 100 MG PO CAPS
100.0000 mg | ORAL_CAPSULE | Freq: Two times a day (BID) | ORAL | Status: DC
  Administered 2023-01-14 – 2023-01-19 (×10): 100 mg via ORAL
  Filled 2023-01-13 (×11): qty 1

## 2023-01-13 MED ORDER — TRAMADOL HCL 50 MG PO TABS: 50.0000 mg | ORAL_TABLET | Freq: Four times a day (QID) | ORAL | Status: DC | PRN

## 2023-01-13 MED ORDER — DOFETILIDE 250 MCG PO CAPS
250.0000 ug | ORAL_CAPSULE | Freq: Two times a day (BID) | ORAL | Status: DC
  Administered 2023-01-14 – 2023-01-19 (×12): 250 ug via ORAL
  Filled 2023-01-13 (×13): qty 1

## 2023-01-13 MED ORDER — ALBUTEROL SULFATE HFA 108 (90 BASE) MCG/ACT IN AERS: 2.0000 | INHALATION_SPRAY | RESPIRATORY_TRACT | Status: DC | PRN

## 2023-01-13 NOTE — Progress Notes (Signed)
Subjective:   PATIENT ID: Kathleen [name] GENDER: female DOB: 1945/10/17, MRN: [id number]  Chief Complaint  Patient presents with   Follow-up    Cough with congestion. Was coughing stuff up phlegm and now she isnt and has a congested cough.     Reason for Visit: Acute visit  Ms. Emalyne Breceda is a 77 year old female former smoker with asthma-COPD overlap, tracheobronchomalacia and chronic respiratory failure (3L at rest and 4L activity), NIV followed by Dr. Vassie Loll at [facility] who presents for acute visit.  Prior visit: She is on [place] for maintenace. Last week she has had worsening shortness of breath. She has a Palliative care NP who sees her weekly and recommended to increase to 5L. Any exertion will tire her out and have near syncope. She has began having productive cough associated with some wheezing. Worsened with laying down and having difficulty sleeping. Has not been able to use BiPAP due to symptoms. She is having chills and shakes. Denies fevers.   01/13/23 Since our last visit she continues to have shortness of breath and still needing 5L O2. Normally on 3L at rest. She has congested/productive cough. Unable to participate in physical therapy. She reports once off her prednisone, her shortness of breath, productive cough and fatigue returned. Difficulty sleeping. Has not been wearing the BiPAP for 3 weeks. Has gained 5Lbs in the last day despite increasing her torsemid and decreased urine output. Reports abdominal swelling. Unable to perform activities including cleaning the kitchen. Reports suffering at home and struggles with walking to dining area and common area at her assisted facility.    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    Concussion 10/04/2021   COPD  (chronic obstructive pulmonary disease) (HCC)    Gout    Hypertension    Longstanding persistent atrial fibrillation (HCC)    on Xarelto   Nonischemic cardiomyopathy (HCC)    Obesity    Pacemaker    Spontaneous pneumothorax 2013     Family History  Problem Relation Age of Onset   Breast cancer Mother 54       3 different times   Pancreatic cancer Mother    Emphysema Father    Healthy Brother    Breast cancer Cousin    Valvular heart disease Son    Obesity Daughter    Colon cancer Neg Hx    Esophageal cancer Neg Hx    Stomach cancer Neg Hx    Inflammatory bowel disease Neg Hx    Liver disease Neg Hx    Rectal cancer Neg Hx      Social History   Occupational History   Occupation: retired  Tobacco Use   Smoking status: Former    Current packs/day: 0.00    Average packs/day: 1 pack/day for 20.0 years (20.0 ttl pk-yrs)    Types: Cigarettes    Start date: 06/09/1967    Quit date: 06/09/1987    Years since quitting: 35.6    Passive exposure: Past   Smokeless tobacco: Never   Tobacco comments:    Former smoker 12/25/21  Vaping Use   Vaping status: Never Used  Substance and Sexual Activity   Alcohol use: No    Alcohol/week: 0.0 standard drinks of alcohol  Drug use: No   Sexual activity: Not Currently    Allergies  Allergen Reactions   Allopurinol Other (See Comments)    Reaction:  Dizziness    Clindamycin Anaphylaxis and Hives   Flublok [Influenza Vaccine Recombinant] Other (See Comments)    Fever 103 with no alternative explanation day after vaccine.  Clydie Braun Highfill FNP-C   Pneumococcal 13-Val Conj Vacc Itching, Swelling and Rash   Dronedarone Rash   Brovana [Arformoterol]     Caused muscle pain   Budesonide     Caused extreme joint pain   Entresto [Sacubitril-Valsartan] Other (See Comments)    hypotension   Fosamax [Alendronate Sodium] Nausea Only   Jardiance [Empagliflozin]     Caused a vaginal infection   Meperidine Nausea And Vomiting   Microplegia  Msa-Msg [Plegisol]     Loopy,diarrhea   Rosuvastatin Other (See Comments)    Reaction:  Muscle spasms    Tetracycline Hives   Adhesive [Tape] Rash   Lovastatin Rash and Other (See Comments)    Muscle Pain     Outpatient Medications Prior to Visit  Medication Sig Dispense Refill   acetaminophen (TYLENOL) 650 MG CR tablet Take 1,300 mg by mouth every 8 (eight) hours as needed for pain.     albuterol (PROVENTIL) (2.5 MG/3ML) 0.083% nebulizer solution Take 2.5 mg by nebulization every 6 (six) hours as needed for wheezing or shortness of breath.     azelastine (ASTELIN) 0.1 % nasal spray Place 2 sprays into both nostrils 2 (two) times daily. 90 mL 0   Cholecalciferol (VITAMIN D3) 50 MCG (2000 UT) TABS Take 2,000 Units by mouth at bedtime.     cyanocobalamin (VITAMIN B12) 1000 MCG/ML injection Inject 1 mL (1,000 mcg total) into the muscle every 30 (thirty) days. 10 mL 0   cyclobenzaprine (FLEXERIL) 5 MG tablet TAKE 1 TABLET BY MOUTH AT BEDTIME AS NEEDED FOR LATERAL HIP PAIN 30 tablet 0   docusate sodium (COLACE) 100 MG capsule Take 100 mg by mouth 2 (two) times daily.     dofetilide (TIKOSYN) 250 MCG capsule Take 1 capsule (250 mcg total) by mouth in the morning and at bedtime. 60 capsule 6   febuxostat (ULORIC) 40 MG tablet TAKE 1 TABLET(40 MG) BY MOUTH DAILY 90 tablet 0   fluticasone furoate-vilanterol (BREO ELLIPTA) 100-25 MCG/ACT AEPB Inhale 1 puff into the lungs daily. 30 each 5   gabapentin (NEURONTIN) 100 MG capsule Take 2 capsules (200 mg total) by mouth at bedtime. 60 capsule 6   guaiFENesin (MUCINEX) 600 MG 12 hr tablet Take 600 mg by mouth 2 (two) times daily.     linaclotide (LINZESS) 145 MCG CAPS capsule Take 1 capsule (145 mcg total) by mouth daily as needed (constipation). 90 capsule 0   losartan (COZAAR) 25 MG tablet TAKE 1 TABLET(25 MG) BY MOUTH AT BEDTIME 90 tablet 3   Magnesium 500 MG TABS Take 500 mg by mouth at bedtime.     NEXLETOL 180 MG TABS TAKE 1 TABLET BY MOUTH DAILY  90 tablet 0   nystatin cream (MYCOSTATIN) Apply to affected area 2 times daily 90 g 1   OXYGEN Inhale 5 L into the lungs continuous. Use with resmed ventilator     revefenacin (YUPELRI) 175 MCG/3ML nebulizer solution Take 3 mLs (175 mcg total) by nebulization daily. 90 mL 11   spironolactone (ALDACTONE) 25 MG tablet TAKE 1 TABLET(25 MG) BY MOUTH EVERY MORNING 90 tablet 3   torsemide (DEMADEX) 20 MG tablet Take 1  tablet (20 mg total) by mouth daily. 90 tablet 2   traMADol (ULTRAM) 50 MG tablet Take 1 tablet (50 mg total) by mouth every 6 (six) hours as needed. 360 tablet 1   XARELTO 20 MG TABS tablet TAKE 1 TABLET(20 MG) BY MOUTH DAILY WITH SUPPER 30 tablet 5   albuterol (VENTOLIN HFA) 108 (90 Base) MCG/ACT inhaler Inhale 2 puffs into the lungs every 4 (four) hours as needed for wheezing or shortness of breath. (Patient not taking: Reported on 01/13/2023) 8 g 2   chlorpheniramine-HYDROcodone (TUSSIONEX) 10-8 MG/5ML Take 5 mLs by mouth at bedtime as needed for cough. (Patient not taking: Reported on 01/13/2023) 115 mL 0   potassium chloride (KLOR-CON) 10 MEQ tablet Take 1 tablet (10 mEq total) by mouth daily. 90 tablet 3   No facility-administered medications prior to visit.    Review of Systems  Constitutional:  Positive for malaise/fatigue. Negative for chills, diaphoresis, fever and weight loss.  HENT:  Negative for congestion.   Respiratory:  Positive for cough, sputum production, shortness of breath and wheezing. Negative for hemoptysis.   Cardiovascular:  Negative for chest pain, palpitations and leg swelling.     Objective:   Vitals:   01/13/23 1424  BP: 136/68  Pulse: 63  Resp: 16  SpO2: 97%  Weight: 194 lb 12.8 oz (88.4 kg)  Height: 5' (1.524 m)   SpO2: 97 % on 5L  Physical Exam: General: Chronically ill-appearing, no acute distress HENT: [place], AT Eyes: EOMI, no scleral icterus Respiratory: Diminished but clear to auscultation bilaterally.  No crackles, wheezing or  rales Cardiovascular: RRR, -M/R/G, no JVD Extremities:-Edema,-tenderness Neuro: AAO x4, CNII-XII grossly intact Psych: Normal mood, normal affect   Data Reviewed:  Imaging: CXR 12/21/22 Increased right interstitial markings  PFT: None on file  Labs:    Latest Ref Rng & Units 09/15/2022   11:34 AM 05/22/2022    9:01 AM 05/19/2022   12:11 PM  CBC  WBC 4.0 - 10.5 K/uL 10.0  10.1  11.1   Hemoglobin 12.0 - 15.0 g/dL 40.9  81.1  91.4   Hematocrit 36.0 - 46.0 % 40.8  41.4  40.0   Platelets 150 - 400 K/uL 274  234.0  209.0         Assessment & Plan:   Discussion: 77 year old female former smoker with asthma-COPD overlap, tracheobronchomalacia and chronic respiratory failure (3L at rest and 4L activity), NIV followed by Dr. Vassie Loll at [facility] who presents for acute visit. In exacerbation.   Asthma-COPD exacerbation Acute on chronic hypoxemic respiratory failure - on 3L at baseline Uncontrolled despite prednisone and antibiotics x 2 --Recommend ER evaluation and admission for COPD exacerbation +/- heart failure --Recommend admission for steroids and nebulizers  No orders of the defined types were placed in this encounter.  No orders of the defined types were placed in this encounter.   No follow-ups on file.   I have spent a total time of 50-minutes on the day of the appointment including chart review, data review, collecting history, coordinating care and discussing medical diagnosis and plan with the patient/family. Past medical history, allergies, medications were reviewed. Pertinent imaging, labs and tests included in this note have been reviewed and interpreted independently by me.   Mechele Collin, MD [facility] 01/13/2023 3:24 PM

## 2023-01-13 NOTE — ED Triage Notes (Signed)
Pt arrives pov, referred by pulmonology, c/o shob, recent tx with abx. Pt endorses 5L o2, normally on 3L. Also reports worsening cough

## 2023-01-13 NOTE — ED Notes (Signed)
Report attempted x1 phone call.

## 2023-01-13 NOTE — Progress Notes (Signed)
RT evaluated Pt and she was on 5l with sats 100%. RT lowered her O2 to 3 l and while sitting she maintained 100%. The Pt stated that she is coughing a lot and had seen her pulmonologist and has had antibiotic and steroids and nothing has helped. She stated that is she get up walking that she feel like she is going to pass out. The Pt stated the the pulmonologist sent her to the ER so she could be admitted. Pt is on 4L and is able to talk in full sentences but does have a wet cough.

## 2023-01-13 NOTE — H&P (Signed)
History and Physical    Patient: Kathleen [name] NWG:[id number] DOB: 04-06-1946 DOA: 01/13/2023 DOS: the patient was seen and examined on 01/13/2023 PCP: [name], MD  Patient coming from: {Point_of_Origin:[address]}  Chief Complaint:  Chief Complaint  Patient presents with   Shortness of Breath   HPI: Kathleen [name] is a 77 y.o. 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    Concussion 10/04/2021   COPD (chronic obstructive pulmonary disease) (HCC)    Gout    Hypertension    Longstanding persistent atrial fibrillation (HCC)    on Xarelto   Nonischemic cardiomyopathy (HCC)    Obesity    Pacemaker    Spontaneous pneumothorax 2013   Past Surgical History:  Procedure Laterality Date   ABDOMINAL HYSTERECTOMY     APPENDECTOMY     ATRIAL FIBRILLATION ABLATION  07/20/2013   by Dr Christin Fudge   AV nodal ablation  11/01/2013   by Dr Christin Fudge, repeated by Dr Wilford Grist   BREAST BIOPSY Bilateral 1997   negative   CARDIAC CATHETERIZATION     CHOLECYSTECTOMY     HERNIA REPAIR     PACEMAKER INSERTION  06/2017   MDT Viva CRT-P implanted by Dr Christin Fudge after AV nodal ablation,  LV lead could not be placed   PACEMAKER INSERTION  05/2020   with lead bundle   RIGHT/LEFT HEART CATH AND CORONARY ANGIOGRAPHY N/A 03/20/2022   Procedure: RIGHT/LEFT HEART CATH AND CORONARY ANGIOGRAPHY;  Surgeon: [place], Peter M, MD;  Location: [facility] INVASIVE CV LAB;  Service: Cardiovascular;  Laterality: N/A;   Social History:  reports that she quit smoking about 35 years ago. Her smoking use included cigarettes. She started smoking about 55 years ago. She has a 20 pack-year smoking history. She has been exposed to tobacco smoke. She has never  used smokeless tobacco. She reports that she does not drink alcohol and does not use drugs.  Allergies  Allergen Reactions   Allopurinol Other (See Comments)    Reaction:  Dizziness    Clindamycin Anaphylaxis and Hives   Flublok [Influenza Vaccine Recombinant] Other (See Comments)    Fever 103 with no alternative explanation day after vaccine.  Kathleen Braun Highfill FNP-C   Pneumococcal 13-Val Conj Vacc Itching, Swelling and Rash   Dronedarone Rash   Brovana [Arformoterol]     Caused muscle pain   Budesonide     Caused extreme joint pain   Entresto [Sacubitril-Valsartan] Other (See Comments)    hypotension   Fosamax [Alendronate Sodium] Nausea Only   Jardiance [Empagliflozin]     Caused a vaginal infection   Meperidine Nausea And Vomiting   Microplegia Msa-Msg [Plegisol]     Loopy,diarrhea   Rosuvastatin Other (See Comments)    Reaction:  Muscle spasms    Tetracycline Hives   Adhesive [Tape] Rash   Lovastatin Rash and Other (See Comments)    Muscle Pain    Family History  Problem Relation Age of Onset   Breast cancer Mother 43       3 different times   Pancreatic cancer Mother    Emphysema Father    Healthy Brother    Breast cancer Cousin    Valvular heart disease Son  Obesity Daughter    Colon cancer Neg Hx    Esophageal cancer Neg Hx    Stomach cancer Neg Hx    Inflammatory bowel disease Neg Hx    Liver disease Neg Hx    Rectal cancer Neg Hx     Prior to Admission medications   Medication Sig Start Date End Date Taking? Authorizing Provider  acetaminophen (TYLENOL) 650 MG CR tablet Take 1,300 mg by mouth every 8 (eight) hours as needed for pain.    [provider]  albuterol (PROVENTIL) (2.5 MG/3ML) 0.083% nebulizer solution Take 2.5 mg by nebulization every 6 (six) hours as needed for wheezing or shortness of breath. 06/18/22   [provider]  albuterol (VENTOLIN HFA) 108 (90 Base) MCG/ACT inhaler Inhale 2 puffs into the lungs every 4 (four) hours  as needed for wheezing or shortness of breath. Patient not taking: Reported on 01/13/2023 04/06/22   Kathleen Cowden, MD  azelastine (ASTELIN) 0.1 % nasal spray Place 2 sprays into both nostrils 2 (two) times daily. 11/05/22   Kathleen Milch, MD  chlorpheniramine-HYDROcodone (TUSSIONEX) 10-8 MG/5ML Take 5 mLs by mouth at bedtime as needed for cough. Patient not taking: Reported on 01/13/2023 12/21/22   Kathleen Cutter, MD  Cholecalciferol (VITAMIN D3) 50 MCG (2000 UT) TABS Take 2,000 Units by mouth at bedtime.    [provider]  cyanocobalamin (VITAMIN B12) 1000 MCG/ML injection Inject 1 mL (1,000 mcg total) into the muscle every 30 (thirty) days. 05/05/22   [name], MD  cyclobenzaprine (FLEXERIL) 5 MG tablet TAKE 1 TABLET BY MOUTH AT BEDTIME AS NEEDED FOR LATERAL HIP PAIN 10/13/22   Fuller Plan, MD  docusate sodium (COLACE) 100 MG capsule Take 100 mg by mouth 2 (two) times daily.    [provider]  dofetilide (TIKOSYN) 250 MCG capsule Take 1 capsule (250 mcg total) by mouth in the morning and at bedtime. 12/01/22 12/01/23  Fenton, Clint R, PA  febuxostat (ULORIC) 40 MG tablet TAKE 1 TABLET(40 MG) BY MOUTH DAILY 12/30/22   Rice, Jamesetta Orleans, MD  fluticasone furoate-vilanterol (BREO ELLIPTA) 100-25 MCG/ACT AEPB Inhale 1 puff into the lungs daily. 07/30/22   Cobb, Ruby Cola, NP  gabapentin (NEURONTIN) 100 MG capsule Take 2 capsules (200 mg total) by mouth at bedtime. 11/03/22 06/01/23  Windell Norfolk, MD  guaiFENesin (MUCINEX) 600 MG 12 hr tablet Take 600 mg by mouth 2 (two) times daily.    [provider]  linaclotide (LINZESS) 145 MCG CAPS capsule Take 1 capsule (145 mcg total) by mouth daily as needed (constipation). 05/05/22   [name], MD  losartan (COZAAR) 25 MG tablet TAKE 1 TABLET(25 MG) BY MOUTH AT BEDTIME 12/07/22   Bensimhon, [name], MD  Magnesium 500 MG TABS Take 500 mg by mouth at bedtime.    [provider]  NEXLETOL 180 MG TABS TAKE  1 TABLET BY MOUTH DAILY 11/11/22   [name], MD  nystatin cream (MYCOSTATIN) Apply to affected area 2 times daily 09/08/21   [name], MD  OXYGEN Inhale 5 L into the lungs continuous. Use with resmed ventilator    [provider]  potassium chloride (KLOR-CON) 10 MEQ tablet Take 1 tablet (10 mEq total) by mouth daily. 07/22/22 11/18/22  Jacklynn Ganong, FNP  revefenacin (YUPELRI) 175 MCG/3ML nebulizer solution Take 3 mLs (175 mcg total) by nebulization daily. 11/05/22   Kathleen Milch, MD  spironolactone (ALDACTONE) 25 MG tablet TAKE  1 TABLET(25 MG) BY MOUTH EVERY MORNING 12/02/22   Bensimhon, [name], MD  torsemide (DEMADEX) 20 MG tablet Take 1 tablet (20 mg total) by mouth daily. 07/22/22   Milford, [name], FNP  traMADol (ULTRAM) 50 MG tablet Take 1 tablet (50 mg total) by mouth every 6 (six) hours as needed. 05/14/22   [name], MD  XARELTO 20 MG TABS tablet TAKE 1 TABLET(20 MG) BY MOUTH DAILY WITH SUPPER 07/13/22   Bensimhon, [name], MD    Physical Exam: Vitals:   01/13/23 1930 01/13/23 1937 01/13/23 2116 01/13/23 2230  BP: (!) 140/58   (!) 149/69  Pulse: 77  71 74  Resp: 13   18  Temp:  97.9 F (36.6 C)  98.7 F (37.1 C)  TempSrc:  Oral  Oral  SpO2: 100%  100% 99%  Weight:      Height:       *** Data Reviewed: {Tip this will not be part of the note when signed- Document your independent interpretation of telemetry tracing, EKG, lab, Radiology test or any other diagnostic tests. Add any new diagnostic test ordered today. (Optional):26781} {Results:26384}  Assessment and Plan: No notes have been filed under this hospital service. Service: Hospitalist     Advance Care Planning:   Code Status: Prior ***  Consults: ***  Family Communication: ***  Severity of Illness: {Observation/Inpatient:21159}  AuthorLonia Blood, MD 01/13/2023 11:40 PM  For on call review [email or web address].

## 2023-01-13 NOTE — ED Provider Notes (Signed)
[place] EMERGENCY DEPARTMENT AT [facility] Provider Note   CSN: [id number] Arrival date & time: 01/13/23  1536     History  Chief Complaint  Patient presents with   Shortness of Breath    Kathleen [name] is a 77 y.o. female with a past medical history of COPD, CHF, who presents emergency department with concerns for shortness of breath onset 1 week.  Notes that her shortness of breath is exacerbated with exertion.  Has associated productive cough, intermittent sternal chest pain (last episode was this morning and was fleeting), s/p R.  Notes that her chest pain is typically at night.  She typically wears 3 L of oxygen continuously however has had to increase this to 5 L due to worsening of her shortness of breath.  Has also had a 5 pound weight gain in 1 day, patient is compliant with her torsemide.  Was previously treated by her pulmonologist with Vantin on 12/21/2022 and 12/29/2022.  Also was prescribed Tussionex on 12/21/2022.  No relief of her symptoms with these medications.  Denies leg swelling.   The history is provided by the patient. No language interpreter was used.       Home Medications Prior to Admission medications   Medication Sig Start Date End Date Taking? Authorizing Provider  acetaminophen (TYLENOL) 650 MG CR tablet Take 1,300 mg by mouth every 8 (eight) hours as needed for pain.    [provider]  albuterol (PROVENTIL) (2.5 MG/3ML) 0.083% nebulizer solution Take 2.5 mg by nebulization every 6 (six) hours as needed for wheezing or shortness of breath. 06/18/22   [provider]  albuterol (VENTOLIN HFA) 108 (90 Base) MCG/ACT inhaler Inhale 2 puffs into the lungs every 4 (four) hours as needed for wheezing or shortness of breath. Patient not taking: Reported on 01/13/2023 04/06/22   Nyoka Cowden, MD  azelastine (ASTELIN) 0.1 % nasal spray Place 2 sprays into both nostrils 2 (two) times daily. 11/05/22   Oretha Milch, MD   chlorpheniramine-HYDROcodone (TUSSIONEX) 10-8 MG/5ML Take 5 mLs by mouth at bedtime as needed for cough. Patient not taking: Reported on 01/13/2023 12/21/22   Luciano Cutter, MD  Cholecalciferol (VITAMIN D3) 50 MCG (2000 UT) TABS Take 2,000 Units by mouth at bedtime.    [provider]  cyanocobalamin (VITAMIN B12) 1000 MCG/ML injection Inject 1 mL (1,000 mcg total) into the muscle every 30 (thirty) days. 05/05/22   [name], MD  cyclobenzaprine (FLEXERIL) 5 MG tablet TAKE 1 TABLET BY MOUTH AT BEDTIME AS NEEDED FOR LATERAL HIP PAIN 10/13/22   Fuller Plan, MD  docusate sodium (COLACE) 100 MG capsule Take 100 mg by mouth 2 (two) times daily.    [provider]  dofetilide (TIKOSYN) 250 MCG capsule Take 1 capsule (250 mcg total) by mouth in the morning and at bedtime. 12/01/22 12/01/23  Fenton, Clint R, PA  febuxostat (ULORIC) 40 MG tablet TAKE 1 TABLET(40 MG) BY MOUTH DAILY 12/30/22   Rice, Jamesetta Orleans, MD  fluticasone furoate-vilanterol (BREO ELLIPTA) 100-25 MCG/ACT AEPB Inhale 1 puff into the lungs daily. 07/30/22   Cobb, Ruby Cola, NP  gabapentin (NEURONTIN) 100 MG capsule Take 2 capsules (200 mg total) by mouth at bedtime. 11/03/22 06/01/23  Windell Norfolk, MD  guaiFENesin (MUCINEX) 600 MG 12 hr tablet Take 600 mg by mouth 2 (two) times daily.    [provider]  linaclotide (LINZESS) 145 MCG CAPS capsule Take 1 capsule (145 mcg total) by mouth  daily as needed (constipation). 05/05/22   [name], MD  losartan (COZAAR) 25 MG tablet TAKE 1 TABLET(25 MG) BY MOUTH AT BEDTIME 12/07/22   Bensimhon, [name], MD  Magnesium 500 MG TABS Take 500 mg by mouth at bedtime.    [provider]  NEXLETOL 180 MG TABS TAKE 1 TABLET BY MOUTH DAILY 11/11/22   [name], MD  nystatin cream (MYCOSTATIN) Apply to affected area 2 times daily 09/08/21   [name], MD  OXYGEN Inhale 5 L into the lungs continuous. Use with resmed ventilator    [provider]  potassium chloride (KLOR-CON) 10 MEQ tablet Take 1 tablet (10 mEq total) by mouth daily. 07/22/22 11/18/22  Jacklynn Ganong, FNP  revefenacin (YUPELRI) 175 MCG/3ML nebulizer solution Take 3 mLs (175 mcg total) by nebulization daily. 11/05/22   Oretha Milch, MD  spironolactone (ALDACTONE) 25 MG tablet TAKE 1 TABLET(25 MG) BY MOUTH EVERY MORNING 12/02/22   Bensimhon, [name], MD  torsemide (DEMADEX) 20 MG tablet Take 1 tablet (20 mg total) by mouth daily. 07/22/22   Milford, [name], FNP  traMADol (ULTRAM) 50 MG tablet Take 1 tablet (50 mg total) by mouth every 6 (six) hours as needed. 05/14/22   [name], MD  XARELTO 20 MG TABS tablet TAKE 1 TABLET(20 MG) BY MOUTH DAILY WITH SUPPER 07/13/22   Bensimhon, [name], MD      Allergies    Allopurinol, Clindamycin, Flublok [influenza vaccine recombinant], Pneumococcal 13-val conj vacc, Dronedarone, Brovana [arformoterol], Budesonide, Entresto [sacubitril-valsartan], Fosamax [alendronate sodium], Jardiance [empagliflozin], Meperidine, Microplegia msa-msg [plegisol], Rosuvastatin, Tetracycline, Adhesive [tape], and Lovastatin    Review of Systems   Review of Systems  Respiratory:  Positive for shortness of breath.   All other systems reviewed and are negative.   Physical Exam Updated Vital Signs BP (!) 154/62 (BP Location: Left Arm)   Pulse 76   Temp 98.4 F (36.9 C)   Resp 20   Ht 5' (1.524 m)   Wt 88 kg   SpO2 100%   BMI 37.89 kg/m  Physical Exam Vitals and nursing note reviewed.  Constitutional:      General: She is not in acute distress.    Appearance: She is not diaphoretic.  HENT:     Head: Normocephalic and atraumatic.     Mouth/Throat:     Pharynx: No oropharyngeal exudate.  Eyes:     General: No scleral icterus.    Conjunctiva/sclera: Conjunctivae normal.  Cardiovascular:     Rate and Rhythm: Normal rate and regular rhythm.     Pulses: Normal pulses.     Heart sounds: Normal heart sounds.   Pulmonary:     Effort: Pulmonary effort is normal. No respiratory distress.     Breath sounds: Normal breath sounds. No wheezing.     Comments: Able to speak in clear complete sentences.  Decreased breath sounds noted diffusely throughout lung field. Abdominal:     General: Bowel sounds are normal.     Palpations: Abdomen is soft. There is no mass.     Tenderness: There is no abdominal tenderness. There is no guarding or rebound.  Musculoskeletal:        General: Normal range of motion.     Cervical back: Normal range of motion and neck supple.  Skin:    General: Skin is warm and dry.  Neurological:     Mental Status: She is alert.  Psychiatric:  Behavior: Behavior normal.     ED Results / Procedures / Treatments   Labs (all labs ordered are listed, but only abnormal results are displayed) Labs Reviewed  BASIC METABOLIC PANEL - Abnormal; Notable for the following components:      Result Value   Glucose, Bld 156 (*)    Calcium 10.5 (*)    All other components within normal limits  CBC WITH DIFFERENTIAL/PLATELET - Abnormal; Notable for the following components:   WBC 11.3 (*)    Hemoglobin 11.5 (*)    HCT 34.8 (*)    Neutro Abs 10.1 (*)    Lymphs Abs 0.6 (*)    Abs Immature Granulocytes 0.17 (*)    All other components within normal limits  BRAIN NATRIURETIC PEPTIDE - Abnormal; Notable for the following components:   B Natriuretic Peptide 129.5 (*)    All other components within normal limits  SARS CORONAVIRUS 2 BY RT PCR  HIV ANTIBODY (ROUTINE TESTING W REFLEX)  COMPREHENSIVE METABOLIC PANEL  CBC  MAGNESIUM  TROPONIN I (HIGH SENSITIVITY)  TROPONIN I (HIGH SENSITIVITY)  TROPONIN I (HIGH SENSITIVITY)    EKG EKG Interpretation Date/Time:  Wednesday January 13 2023 16:02:07 EDT Ventricular Rate:  74 PR Interval:  204 QRS Duration:  124 QT Interval:  402 QTC Calculation: 446 R Axis:   -68  Text Interpretation: AV dual-paced rhythm Abnormal ECG When  compared with ECG of 18-Nov-2022 10:48, No significant change was found Confirmed by Jacalyn Lefevre [phone]) on 01/13/2023 4:50:20 PM  Radiology DG Chest Port 1 View  Result Date: 01/13/2023 CLINICAL DATA:  Shortness of breath. EXAM: PORTABLE CHEST 1 VIEW COMPARISON:  December 21, 2022 FINDINGS: There is stable multi lead AICD positioning. The cardiac silhouette is mildly enlarged and unchanged in size. Mild, diffuse, chronic appearing increased interstitial lung markings are seen. Mild atelectasis is noted within the bilateral lung bases. A small left pleural effusion is seen. No pneumothorax is identified. Degenerative changes seen involving both shoulders and throughout the thoracic spine. IMPRESSION: 1. Stable cardiomegaly with mild bibasilar atelectasis. 2. Small left pleural effusion. Electronically Signed   By: Aram Candela M.D.   On: 01/13/2023 17:27    Procedures Procedures    Medications Ordered in ED Medications  magnesium oxide (MAG-OX) tablet 400 mg (has no administration in time range)  cholecalciferol (VITAMIN D3) 25 MCG (1000 UNIT) tablet 2,000 Units (has no administration in time range)  docusate sodium (COLACE) capsule 100 mg (has no administration in time range)  acetaminophen (TYLENOL) tablet 650 mg (has no administration in time range)  cyanocobalamin (VITAMIN B12) injection 1,000 mcg (has no administration in time range)  linaclotide (LINZESS) capsule 145 mcg (has no administration in time range)  traMADol (ULTRAM) tablet 50 mg (has no administration in time range)  rivaroxaban (XARELTO) tablet 20 mg (has no administration in time range)  torsemide (DEMADEX) tablet 20 mg (has no administration in time range)  guaiFENesin (MUCINEX) 12 hr tablet 600 mg (has no administration in time range)  fluticasone furoate-vilanterol (BREO ELLIPTA) 100-25 MCG/ACT 1 puff (has no administration in time range)  cyclobenzaprine (FLEXERIL) tablet 5 mg (has no administration in time range)   gabapentin (NEURONTIN) capsule 200 mg (has no administration in time range)  azelastine (ASTELIN) 0.1 % nasal spray 2 spray (has no administration in time range)  revefenacin (YUPELRI) nebulizer solution 175 mcg (has no administration in time range)  Bempedoic Acid TABS 180 mg (has no administration in time range)  dofetilide (TIKOSYN) capsule 250  mcg (has no administration in time range)  spironolactone (ALDACTONE) tablet 25 mg (has no administration in time range)  losartan (COZAAR) tablet 25 mg (has no administration in time range)  febuxostat (ULORIC) tablet 40 mg (has no administration in time range)  cefTRIAXone (ROCEPHIN) 1 g in sodium chloride 0.9 % 100 mL IVPB (has no administration in time range)  methylPREDNISolone sodium succinate (SOLU-MEDROL) 125 mg/2 mL injection 125 mg (has no administration in time range)    Followed by  predniSONE (DELTASONE) tablet 40 mg (has no administration in time range)  albuterol (PROVENTIL) (2.5 MG/3ML) 0.083% nebulizer solution 2.5 mg (has no administration in time range)  ipratropium-albuterol (DUONEB) 0.5-2.5 (3) MG/3ML nebulizer solution 3 mL (has no administration in time range)  azithromycin (ZITHROMAX) 500 mg in sodium chloride 0.9 % 250 mL IVPB (has no administration in time range)  methylPREDNISolone sodium succinate (SOLU-MEDROL) 125 mg/2 mL injection 125 mg (125 mg Intravenous Given 01/13/23 1757)  ipratropium-albuterol (DUONEB) 0.5-2.5 (3) MG/3ML nebulizer solution 3 mL (3 mLs Nebulization Given 01/13/23 1735)  ipratropium-albuterol (DUONEB) 0.5-2.5 (3) MG/3ML nebulizer solution 3 mL (3 mLs Nebulization Given 01/13/23 1908)    ED Course/ Medical Decision Making/ A&P Clinical Course as of 01/13/23 2353  Wed Jan 13, 2023  1840 Discussed with patient lab and imaging findings. Discussed with patient plans for admission. Pt agreeable at this time.  [SB]  1844 Attending consult with the hospitalist, Dr. Arlean Hopping who agrees with admission at this  time. [SB]    Clinical Course User Index [SB] ,  A, PA-C                                 Medical Decision Making Amount and/or Complexity of Data Reviewed Labs: ordered. Radiology: ordered.  Risk Prescription drug management. Decision regarding hospitalization.   Patient presents to the ED complaining of shortness of breath that has been progressively worsening x 1 week.  Has been treated with multiple rounds of antibiotics, steroids, Tussionex without relief of her symptoms.  Vital signs patient afebrile and not tachycardic.  Patient is on 5 L of oxygen increased from her 3 L of oxygen.  On exam patient with no appreciable pitting edema, chest wall tenderness palpation, abdominal tenderness to palpation. No acute cardiovascular, respiratory, abdominal exam findings.  Recent echocardiogram on 09/15/2022 showed EF of 35-40%. Differential diagnosis includes CHF exacerbation, ACS, PTX, PNA, COVID, COPD exacerbation.   Co morbidities that complicate the patient evaluation: Hypertension, A-fib, COPD, CHF  Additional history obtained:  Additional history obtained from her pulmonologist, Dr. Everardo All who evaluated patient in the emergency department and referred patient to the ED for admission due to her worsening shortness of breath and likely COPD exacerbation External records from outside source obtained and reviewed including: Patient was evaluated by her pulmonologist today and referred to the ED for further evaluation of her symptoms  Labs:  I ordered, and personally interpreted labs.  The pertinent results include:   CBC with leukocytosis at 11.3 otherwise unremarkable BNP was slightly elevated glucose at 156 otherwise unremarkable Negative COVID swab Initial troponin at 3 BNP slightly elevated at 129.5   Imaging: I ordered imaging studies including chest x-ray I independently visualized and interpreted imaging which showed:  1. Stable cardiomegaly with mild bibasilar  atelectasis.  2. Small left pleural effusion.   I agree with the radiologist interpretation  Medications:  I ordered medication including Solu-Medrol, DuoNeb for symptom management  Reevaluation of the patient after these medicines and interventions, I reevaluated the patient and found that they have improved I have reviewed the patients home medicines and have made adjustments as needed  Consultations: I requested consultation with the Hospitalist, Dr. Arlean Hopping and discussed lab and imaging findings as well as pertinent plan - they recommend: Admission at this time  Disposition: Presentation suspicious for COPD exacerbation.  Concerns this time for ACS, pneumonia, COVID, PTX. After consideration of the diagnostic results and the patients response to treatment, I feel that the patient would benefit from Admission to the hospital. Discussed with patient plans for admission.  Patient agreeable this time.  Patient appears safe for admission at this time.   This chart was dictated using voice recognition software, Dragon. Despite the best efforts of this provider to proofread and correct errors, errors may still occur which can change documentation meaning.  Final Clinical Impression(s) / ED Diagnoses Final diagnoses:  COPD exacerbation [facility])    Rx / DC Orders ED Discharge Orders     None         ,  A, PA-C 01/13/23 2353    Jacalyn Lefevre, MD 01/14/23 1907

## 2023-01-13 NOTE — ED Notes (Signed)
ED TO INPATIENT HANDOFF REPORT  ED Nurse Name and Phone #:    S Name/Age/Gender Kathleen [name] 77 y.o. female Room/Bed: DB004/DB004  Code Status   Code Status: Prior  Home/SNF/Other Home Patient oriented to: self, place, time, and situation Is this baseline? Yes   Triage Complete: Triage complete  Chief Complaint Acute exacerbation of chronic obstructive pulmonary disease (COPD) (HCC) [J44.1]  Triage Note Pt arrives pov, referred by pulmonology, c/o shob, recent tx with abx. Pt endorses 5L o2, normally on 3L. Also reports worsening cough   Allergies Allergies  Allergen Reactions   Allopurinol Other (See Comments)    Reaction:  Dizziness    Clindamycin Anaphylaxis and Hives   Flublok [Influenza Vaccine Recombinant] Other (See Comments)    Fever 103 with no alternative explanation day after vaccine.  Clydie Braun Highfill FNP-C   Pneumococcal 13-Val Conj Vacc Itching, Swelling and Rash   Dronedarone Rash   Brovana [Arformoterol]     Caused muscle pain   Budesonide     Caused extreme joint pain   Entresto [Sacubitril-Valsartan] Other (See Comments)    hypotension   Fosamax [Alendronate Sodium] Nausea Only   Jardiance [Empagliflozin]     Caused a vaginal infection   Meperidine Nausea And Vomiting   Microplegia Msa-Msg [Plegisol]     Loopy,diarrhea   Rosuvastatin Other (See Comments)    Reaction:  Muscle spasms    Tetracycline Hives   Adhesive [Tape] Rash   Lovastatin Rash and Other (See Comments)    Muscle Pain    Level of Care/Admitting Diagnosis ED Disposition     ED Disposition  Admit   Condition  --   Comment  Hospital Area: [facility] [[id number]]  Level of Care: Telemetry [5]  Admit to tele based on following criteria: Monitor for Ischemic changes  May admit patient to [facility] or [facility] if equivalent level of care is available:: Yes  Interfacility transfer: Yes  Covid Evaluation: Asymptomatic - no recent exposure (last 10 days)  testing not required  Diagnosis: Acute exacerbation of chronic obstructive pulmonary disease (COPD) [facility]) [[id number]]  Admitting Physician: Angie Fava [[id number]]  Attending Physician: Angie Fava [[id number]]  Certification:: I certify this patient will need inpatient services for at least 2 midnights  Estimated Length of Stay: 2          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    Concussion 10/04/2021   COPD (chronic obstructive pulmonary disease) (HCC)    Gout    Hypertension    Longstanding persistent atrial fibrillation (HCC)    on Xarelto   Nonischemic cardiomyopathy (HCC)    Obesity    Pacemaker    Spontaneous pneumothorax 2013   Past Surgical History:  Procedure Laterality Date   ABDOMINAL HYSTERECTOMY     APPENDECTOMY     ATRIAL FIBRILLATION ABLATION  07/20/2013   by Dr Christin Fudge   AV nodal ablation  11/01/2013   by Dr Christin Fudge, repeated by Dr Wilford Grist   BREAST BIOPSY Bilateral 1997   negative   CARDIAC CATHETERIZATION     CHOLECYSTECTOMY     HERNIA REPAIR     PACEMAKER INSERTION  06/2017   MDT Viva CRT-P implanted by Dr Christin Fudge after AV nodal ablation,  LV lead could not be placed   PACEMAKER INSERTION  05/2020   with lead bundle   RIGHT/LEFT HEART CATH AND CORONARY ANGIOGRAPHY N/A 03/20/2022   Procedure: RIGHT/LEFT HEART CATH AND CORONARY ANGIOGRAPHY;  Surgeon: [place]azil[place], Peter M, MD;  Location: [facility] INVASIVE CV LAB;  Service: Cardiovascular;  Laterality: N/A;     A IV Location/Drains/Wounds Patient Lines/Drains/Airways Status     Active Line/Drains/Airways     Name Placement date Placement time Site Days   Peripheral IV 01/13/23 20 G Anterior;Left;Proximal Forearm 01/13/23  1757  Forearm  less than 1             Intake/Output Last 24 hours  Intake/Output Summary (Last 24 hours) at 01/13/2023 2102 Last data filed at 01/13/2023 1806 Gross per 24 hour  Intake --  Output 500 ml  Net -500 ml    Labs/Imaging Results for orders placed or performed during the hospital encounter of 01/13/23 (from the past 48 hour(s))  Basic metabolic panel     Status: Abnormal   Collection Time: 01/13/23  5:18 PM  Result Value Ref Range   Sodium 139 135 - 145 mmol/L   Potassium 4.5 3.5 - 5.1 mmol/L   Chloride 106 98 - 111 mmol/L   CO2 24 22 - 32 mmol/L   Glucose, Bld 156 (H) 70 - 99 mg/dL    Comment: Glucose reference range applies only to samples taken after fasting for at least 8 hours.   BUN 22 8 - 23 mg/dL   Creatinine, Ser 0.86 0.44 - 1.00 mg/dL   Calcium 57.8 (H) 8.9 - 10.3 mg/dL   GFR, Estimated >46 >96 mL/min    Comment: (NOTE) Calculated using the CKD-EPI Creatinine Equation (2021)    Anion gap 9 5 - 15    Comment: Performed at [facility], [address], [place], [place] [address]  CBC with Differential     Status: Abnormal   Collection Time: 01/13/23  5:18 PM  Result Value Ref Range   WBC 11.3 (H) 4.0 - 10.5 K/uL   RBC 4.08 3.87 - 5.11 MIL/uL   Hemoglobin 11.5 (L) 12.0 - 15.0 g/dL   HCT 41.3 (L) 24.4 - 01.0 %   MCV 85.3 80.0 - 100.0 fL   MCH 28.2 26.0 - 34.0 pg   MCHC 33.0 30.0 - 36.0 g/dL   RDW 27.2 53.6 - 64.4 %   Platelets 241 150 - 400 K/uL   nRBC 0.0 0.0 - 0.2 %   Neutrophils Relative % 88 %   Neutro Abs 10.1 (H) 1.7 - 7.7 K/uL   Lymphocytes Relative 6 %   Lymphs Abs 0.6 (L) 0.7 - 4.0 K/uL   Monocytes Relative 3 %   Monocytes Absolute 0.4 0.1 - 1.0 K/uL   Eosinophils Relative 0 %   Eosinophils Absolute 0.0 0.0 - 0.5 K/uL   Basophils Relative 1 %   Basophils Absolute 0.1 0.0 - 0.1 K/uL   Immature Granulocytes 2 %   Abs Immature Granulocytes 0.17 (H) 0.00 - 0.07 K/uL    Comment: Performed at [facility], [address], [place],  [place] [address]  Brain natriuretic peptide     Status: Abnormal   Collection Time: 01/13/23  5:18 PM  Result Value Ref Range   B Natriuretic Peptide 129.5 (H) 0.0 - 100.0 pg/mL    Comment: Performed at [facility], [address], [place], [place] [address]  SARS Coronavirus 2 by RT PCR (hospital order, performed in [facility] hospital lab) *cepheid single result test* Anterior Nasal Swab     Status: None   Collection Time: 01/13/23  5:18 PM   Specimen: Anterior Nasal Swab  Result Value Ref Range   SARS Coronavirus 2 by RT PCR NEGATIVE NEGATIVE    Comment: (NOTE) SARS-CoV-2 target nucleic acids are NOT DETECTED.  The SARS-CoV-2 RNA is generally detectable in upper and lower respiratory specimens during the acute phase of infection. The lowest concentration of SARS-CoV-2 viral copies this assay can detect is 250 copies / mL. A negative result does not preclude SARS-CoV-2 infection and should not be used as the sole basis for treatment or other patient management decisions.  A negative result may occur with improper specimen collection / handling, submission of specimen other than nasopharyngeal swab, presence of viral mutation(s) within the areas targeted by this assay, and inadequate number of viral copies (<250 copies / mL). A negative result must be combined with clinical observations, patient history, and epidemiological information.  Fact Sheet for Patients:   RoadLapTop.co.za  Fact Sheet for Healthcare Providers: http://kim-miller.com/  This test is not yet approved or  cleared by the [place] FDA and has been authorized for detection and/or diagnosis of SARS-CoV-2 by FDA under an Emergency Use Authorization (EUA).  This EUA will remain in effect (meaning this test can be used) for the duration of the COVID-19 declaration under Section 564(b)(1) of the Act, 21 U.S.C. section 360bbb-3(b)(1), unless the  authorization is terminated or revoked sooner.  Performed at [facility], [address] [place], [place] [address]   Troponin I (High Sensitivity)     Status: None   Collection Time: 01/13/23  5:18 PM  Result Value Ref Range   Troponin I (High Sensitivity) 3 <18 ng/L    Comment: (NOTE) Elevated high sensitivity troponin I (hsTnI) values and significant  changes across serial measurements may suggest ACS but many other  chronic and acute conditions are known to elevate hsTnI results.  Refer to the "Links" section for chest pain algorithms and additional  guidance. Performed at [facility], [address] [place], [place] [address]   Troponin I (High Sensitivity)     Status: None   Collection Time: 01/13/23  7:13 PM  Result Value Ref Range   Troponin I (High Sensitivity) 3 <18 ng/L    Comment: (NOTE) Elevated high sensitivity troponin I (hsTnI) values and significant  changes across serial measurements may suggest ACS but many other  chronic and acute conditions are known to elevate hsTnI results.  Refer to the "Links" section for chest pain algorithms and additional  guidance. Performed at [facility], [address] [place], [place] [address]    DG Chest [place] 1 View  Result Date: 01/13/2023 CLINICAL DATA:  Shortness of breath. EXAM: PORTABLE CHEST 1 VIEW COMPARISON:  December 21, 2022 FINDINGS: There is stable multi lead AICD positioning. The cardiac silhouette is mildly enlarged and unchanged in size. Mild, diffuse, chronic appearing increased interstitial lung markings are seen. Mild atelectasis is noted within the bilateral lung bases. A small left pleural effusion is seen. No pneumothorax is identified. Degenerative changes seen involving both shoulders and throughout the thoracic spine. IMPRESSION: 1. Stable cardiomegaly with mild bibasilar atelectasis. 2. Small left pleural effusion. Electronically Signed   By:  Aram Candela M.D.   On: 01/13/2023 17:27    Pending Labs Unresulted Labs (From admission, onward)    None       Vitals/Pain Today's Vitals  01/13/23 1735 01/13/23 1745 01/13/23 1930 01/13/23 1937  BP:  139/60 (!) 140/58   Pulse:  74 77   Resp:  (!) 22 13   Temp:    97.9 F (36.6 C)  TempSrc:    Oral  SpO2: 100% 97% 100%   Weight:      Height:      PainSc:        Isolation Precautions No active isolations  Medications Medications  methylPREDNISolone sodium succinate (SOLU-MEDROL) 125 mg/2 mL injection 125 mg (125 mg Intravenous Given 01/13/23 1757)  ipratropium-albuterol (DUONEB) 0.5-2.5 (3) MG/3ML nebulizer solution 3 mL (3 mLs Nebulization Given 01/13/23 1735)  ipratropium-albuterol (DUONEB) 0.5-2.5 (3) MG/3ML nebulizer solution 3 mL (3 mLs Nebulization Given 01/13/23 1908)    Mobility walks with device     Focused Assessments Pulmonary Assessment Handoff:  Lung sounds: Bilateral Breath Sounds: Clear, Diminished O2 Device: Nasal Cannula O2 Flow Rate (L/min): 5 L/min    R Recommendations: See Admitting Provider Note  Report given to:   Additional Notes:

## 2023-01-13 NOTE — ED Notes (Signed)
Report attempted once more. On hold 15 min. [facility] transports patient.

## 2023-01-13 NOTE — Progress Notes (Signed)
Hospitalist Transfer Note:  Transferring facility: [facility] Requesting provider: Dr. Jacalyn Lefevre (EDP at [facility]) Reason for transfer: admission for further evaluation and management of acute copd exacerbation.    8 F w/ h/o chronic hypoxic respiratory failure on 3 L continuous nasal cannula, COPD, chronic systolic heart failure with most recent LVEF 35%, who presented to [facility] ED complaining of  sob.   The patient follows with Dr. Everardo All as her outpatient pulmonologist and conveys that she has been experiencing intermittent shortness of breath over the course the last 8 weeks.  She has been following closely with Dr. Everardo All as an outpatient over that timeframe, and notes that she has been on several courses of antibiotics over that timeframe.  She conveys that her shortness of breath improves while on antibiotics, but is noted a refractory tendency regarding her shortness of breath every time that she completes a course of antibiotics.  The shortness of breath has been associated with cough.  She notes that she has chronic edema in the bilateral lower extremities, without any recent worsening thereof.  In this context, she followed up with Dr. Everardo All  as an outpatient earlier today, who, in the setting of the patient's interval increase in supplemental oxygen requirements from her baseline 3 L nasal cannula to 5 L nasal cannula, recommended that she present to the emergency department for consideration for admission for acute COPD exacerbation.  Vital signs in the ED were notable for the following: Oxygen saturations in the high 90s to 100% on 5 L nasal cannula.  Afebrile.  Saw blood pressures in the 130s to 140s.  Imaging notable for chest x-ray, which showed stable cardiomegaly with bibasilar atelectasis as well as a small left pleural effusion, but no infiltrate nor any evidence of pulmonary edema.  Subsequently, I accepted this patient for transfer for inpatient admission to a med/tele bed at  either [facility] or [facility] (first available) for further work-up and management of the above .       Nursing staff, Please call [facility] Admits & Consults System-Wide number on Amion [phone]) as soon as patient's arrival, so appropriate admitting provider can evaluate the pt.     Newton Pigg, DO Hospitalist

## 2023-01-14 ENCOUNTER — Other Ambulatory Visit: Payer: Self-pay

## 2023-01-14 ENCOUNTER — Encounter (HOSPITAL_COMMUNITY): Payer: Self-pay | Admitting: Internal Medicine

## 2023-01-14 DIAGNOSIS — J441 Chronic obstructive pulmonary disease with (acute) exacerbation: Secondary | ICD-10-CM | POA: Diagnosis not present

## 2023-01-14 MED ORDER — CYANOCOBALAMIN 1000 MCG/ML IJ SOLN: 1000.0000 ug | INTRAMUSCULAR | Status: DC

## 2023-01-14 MED ORDER — ADULT MULTIVITAMIN W/MINERALS CH
1.0000 | ORAL_TABLET | Freq: Every day | ORAL | Status: DC
  Administered 2023-01-14 – 2023-01-19 (×6): 1 via ORAL
  Filled 2023-01-14 (×6): qty 1

## 2023-01-14 MED ORDER — IPRATROPIUM-ALBUTEROL 0.5-2.5 (3) MG/3ML IN SOLN
3.0000 mL | Freq: Two times a day (BID) | RESPIRATORY_TRACT | Status: DC
  Administered 2023-01-14: 3 mL via RESPIRATORY_TRACT
  Filled 2023-01-14: qty 3

## 2023-01-14 MED ORDER — ENSURE ENLIVE PO LIQD
237.0000 mL | Freq: Two times a day (BID) | ORAL | Status: DC
  Administered 2023-01-14 – 2023-01-19 (×9): 237 mL via ORAL

## 2023-01-14 NOTE — Plan of Care (Signed)

## 2023-01-14 NOTE — Evaluation (Signed)
Occupational Therapy Evaluation Patient Details Name: Kathleen [name] MRN: [id number] DOB: 15-Feb-1946 Today's Date: 01/14/2023   History of Present Illness Pt is a 77 y.o. female presenting from pulmonologist via POV with SOB on 5L O2 (3L baseline). Admitted for acute exacerbation of COPD. PMH significant for COPD, systolic dysfunction CHF EF of 35% back in April, chronic respiratory failure on 3 to 4 L of oxygen at home   Clinical Impression   PTA, pt lived in [facility] and was mod I for ADL and light house work. Pt reports over the past 3 weeks, greater oxygen demand, and has been more sedentary (not going to dining hall as much). Upon eval, pt with generalized weakness, deconditioning, and decreased balance. SpO2 >90 throughout session on 4L. Pt with one instance of leaning back onto bed from seated position and reporting "I am going out" but never observed to lose consciousness and VSS. MD and RN aware. Pt then moving about in room and performing toileting, LB ADL, and grooming with CGA. Will continue to follow.       If plan is discharge home, recommend the following: A little help with walking and/or transfers;A little help with bathing/dressing/bathroom;Assistance with cooking/housework;Assist for transportation;Help with stairs or ramp for entrance    Functional Status Assessment  Patient has had a recent decline in their functional status and demonstrates the ability to make significant improvements in function in a reasonable and predictable amount of time.  Equipment Recommendations  None recommended by OT    Recommendations for Other Services       Precautions / Restrictions Precautions Precautions: Fall Restrictions Weight Bearing Restrictions: No      Mobility Bed Mobility               General bed mobility comments: EOB on arrival and in recliner on departure    Transfers Overall transfer level: Needs assistance Equipment used: None Transfers: Sit to/from  Stand Sit to Stand: Contact guard assist           General transfer comment: for safety      Balance Overall balance assessment: Needs assistance Sitting-balance support: No upper extremity supported, Feet supported Sitting balance-Leahy Scale: Fair Sitting balance - Comments: able to perform figure 4   Standing balance support: Single extremity supported, During functional activity Standing balance-Leahy Scale: Poor                             ADL either performed or assessed with clinical judgement   ADL Overall ADL's : Needs assistance/impaired Eating/Feeding: Independent Eating/Feeding Details (indicate cue type and reason): finishing breakfast EOB on arrival. Grooming: Contact guard assist;Standing;Wash/dry hands Grooming Details (indicate cue type and reason): at sink Upper Body Bathing: Set up;Sitting   Lower Body Bathing: Contact guard assist;Sit to/from stand;Sitting/lateral leans   Upper Body Dressing : Set up;Sitting   Lower Body Dressing: Sitting/lateral leans;Set up Lower Body Dressing Details (indicate cue type and reason): to don shoes at EOB Toilet Transfer: Contact guard assist;Ambulation Toilet Transfer Details (indicate cue type and reason): frequen furniture surfing noted Toileting- Clothing Manipulation and Hygiene: Contact guard assist;Sit to/from stand Toileting - Clothing Manipulation Details (indicate cue type and reason): for safety     Functional mobility during ADLs: Contact guard assist General ADL Comments: would likely require AD without holding onto furniture. Suspect pt does this at baseline. Has rollator in room     Vision Ability to See in Adequate  Light: 0 Adequate Patient Visual Report: No change from baseline Vision Assessment?: No apparent visual deficits     Perception         Praxis         Pertinent Vitals/Pain Pain Assessment Pain Assessment: Faces Faces Pain Scale: Hurts a little bit Pain Location: L  foot. Pt reports broken toe? Pain Descriptors / Indicators: Discomfort Pain Intervention(s): Limited activity within patient's tolerance, Monitored during session     Extremity/Trunk Assessment Upper Extremity Assessment Upper Extremity Assessment: Generalized weakness;Right hand dominant;RUE deficits/detail RUE Deficits / Details: Prior rotator cuff surgery per pt (20 years ago) has recently received PT. ABle to perform forward flexion to ~110 degreesn AROM, but unable to tolerate challenge/MMT   Lower Extremity Assessment Lower Extremity Assessment: Defer to PT evaluation       Communication Communication Communication: No apparent difficulties Cueing Techniques: Verbal cues (to stay on task)   Cognition Arousal: Alert Behavior During Therapy: [facility] for tasks assessed/performed Overall Cognitive Status: Within Functional Limits for tasks assessed                                 General Comments: Slightly hyperverbose, but very pleasant and conversational. Able to provide thorough report of events leading to hospitalization. Intermittently needing redirection to stay on task     General Comments  BP EOB at start of session 115/69. Pt with one instance in which she reports "im about to go" and falling back from seated position onto bed. Pt with no loss of consciousness or eye contact with therapist and VSS. MD and RN made aware. BP 127/63 (80) hr 72; in standing also after 139/75 (95) HR 75    Exercises     Shoulder Instructions      Home Living Family/patient expects to be discharged to:: Other (Comment) ([facility])                                 Additional Comments: [facility]. has shower chair, hand held shower head; walk in shower. elevators within the home      Prior Functioning/Environment Prior Level of Function : Independent/Modified Independent             Mobility Comments: no AD to walk within the home. Last 3 weeks, has been  rather sedentary ADLs Comments: 4L O2 at baseline when up moving around. Ind with ADL, bill payment, meds, and meal prep.  Assist with home management.        OT Problem List: Decreased strength;Impaired balance (sitting and/or standing);Decreased activity tolerance;Cardiopulmonary status limiting activity;Decreased safety awareness;Decreased knowledge of use of DME or AE      OT Treatment/Interventions: Self-care/ADL training;Therapeutic exercise;DME and/or AE instruction;Therapeutic activities;Patient/family education;Balance training    OT Goals(Current goals can be found in the care plan section) Acute Rehab OT Goals Patient Stated Goal: "figure out what is going on" OT Goal Formulation: With patient Time For Goal Achievement: 01/28/23 Potential to Achieve Goals: Good  OT Frequency: Min 2X/week    Co-evaluation              AM-PAC OT "6 Clicks" Daily Activity     Outcome Measure Help from another person eating meals?: None Help from another person taking care of personal grooming?: A Little Help from another person toileting, which includes using toliet, bedpan, or urinal?: A Little Help from  another person bathing (including washing, rinsing, drying)?: A Little Help from another person to put on and taking off regular upper body clothing?: A Little Help from another person to put on and taking off regular lower body clothing?: A Little 6 Click Score: 19   End of Session Equipment Utilized During Treatment: Gait belt Nurse Communication: Mobility status;Other (comment) (see general comments)  Activity Tolerance: Patient tolerated treatment well Patient left: in chair;with call bell/phone within reach;with chair alarm set (Bil LE up and slightly reclined)  OT Visit Diagnosis: Unsteadiness on feet (R26.81);Muscle weakness (generalized) (M62.81)                Time: 1610-9604 OT Time Calculation (min): 38 min Charges:  OT General Charges $OT Visit: 1 Visit OT  Evaluation $OT Eval Low Complexity: 1 Low OT Treatments $Self Care/Home Management : 23-37 mins  Tyler Deis, OTR/L [facility] Acute Rehabilitation Office: [phone]   Myrla Halsted 01/14/2023, 10:38 AM

## 2023-01-14 NOTE — Progress Notes (Signed)
Initial Nutrition Assessment  DOCUMENTATION CODES:   Obesity unspecified  INTERVENTION:  Liberalize diet to 2 gram sodium Ensure Enlive po BID, each supplement provides 350 kcal and 20 grams of protein. MVI with minerals daily  NUTRITION DIAGNOSIS:   Increased nutrient needs related to chronic illness (COPD) as evidenced by estimated needs.  GOAL:   Patient will meet greater than or equal to 90% of their needs  MONITOR:   PO intake, Supplement acceptance, Labs, Weight trends, I & O's  REASON FOR ASSESSMENT:   Consult Assessment of nutrition requirement/status  ASSESSMENT:   Pt admitted with SOB r/t acute exacerbation of COPD. PMH significant for COPD, systolic dysfunction CHF EF of 35%, chronic respiratory failure.  Pt sitting up in chair at time of visit. She reports that her appetite is doing well. She is not fond of the food provided at her ILF. She states that she eats twice daily. Breakfast typically includes eggs and bacon or sausage on an english muffin. If she eats breakfast she does not eat lunch and vice versa. She occasionally may try to eat in the dining hall but will usually eat something she prepares in her apartment. On occasion she may drink a Boost as a meal replacement if she does not eat a meal.   No documented meal completions on file to review.   Pt states that she believes her dry weight is about 186-187 lbs. Over the last few days her weight increased 5 lbs, she increased her torsemide but did not feel like it helped decrease her fluid weight.   Reviewed weight history. Over the last year, pt's weight has fluctuated between 83-89 kg. No significant weight loss noted.   Medications:  bempedoic acid, vitamin D3, Vitamin B12, colace, mag-ox, prednisone, torsemide, IV abx  Labs: Ca 10.5  NUTRITION - FOCUSED PHYSICAL EXAM:  Flowsheet Row Most Recent Value  Orbital Region No depletion  Upper Arm Region No depletion  Thoracic and Lumbar Region No  depletion  Buccal Region No depletion  Temple Region No depletion  Clavicle Bone Region No depletion  Clavicle and Acromion Bone Region No depletion  Dorsal Hand No depletion  Patellar Region No depletion  Anterior Thigh Region No depletion  Posterior Calf Region No depletion  Edema (RD Assessment) None  Hair Reviewed  Eyes Reviewed  Mouth Reviewed  Skin Reviewed  Nails Reviewed       Diet Order:   Diet Order             Diet heart healthy/carb modified Room service appropriate? Yes; Fluid consistency: Thin  Diet effective now                   EDUCATION NEEDS:   Education needs have been addressed  Skin:  Skin Assessment: Reviewed RN Assessment  Last BM:  8/7  Height:   Ht Readings from Last 1 Encounters:  01/13/23 5' (1.524 m)    Weight:   Wt Readings from Last 1 Encounters:  01/13/23 88 kg    Ideal Body Weight:  45.5 kg  BMI:  Body mass index is 37.89 kg/m.  Estimated Nutritional Needs:   Kcal:  1300-1500  Protein:  65-80g  Fluid:  >/=1.5L  Drusilla Kanner, RDN, LDN Clinical Nutrition

## 2023-01-14 NOTE — TOC Initial Note (Signed)
Transition of Care [facility]) - Initial/Assessment Note    Patient Details  Name: Kathleen [name] MRN: [id number] Date of Birth: [birth year]  [facility]) CM/SW Contact:    Janae Bridgeman, RN Phone Number: 01/14/2023, 11:21 AM  Clinical Narrative:                 CM met with the patient at the bedside to discuss TOC needs to return home to [facility] ILF when medically stable.  The patient admitted for COPD exacerbation and is currently on 4L/min [place] oxygen.    Patient lives at home alone in ILF and has contract PT therapy at [facility].  The patient is active with [facility] and has a Charity fundraiser once a month.  Patient was agreeable for home health RN and has had [facility] recently.  I placed HH order for RN to be co-signed by MD.  I called Zollie Scale and Kandee Keen accepted for [facility] RN.  DME at the home includes home oxygen through [employer], Rolator, Bipap (currently not using and pt states pulmonologist is aware, bathroom scales for daily weights/recording, and nebulizer.  Patient plans to call daughter-in-law for transportation home when medically stable.  Patient has portable tank in hospital room for ride home per patient.  No other TOC needs at this time.  Expected Discharge Plan: Home w Home Health Services Barriers to Discharge: Continued Medical Work up   Patient Goals and CMS Choice Patient states their goals for this hospitalization and ongoing recovery are:: To return home to [place] ILF apartment CMS Medicare.gov Compare Post Acute Care list provided to:: Patient Choice offered to / list presented to : Patient [place] ownership [facility].provided to:: Patient    Expected Discharge Plan and Services   Discharge Planning Services: CM Consult Post Acute Care Choice: Home Health Living arrangements for the past 2 months: Apartment                           HH Arranged: RN HH Agency: [facility] Date [facility] Agency Contacted:  01/14/23 Time HH Agency Contacted: [phone] Representative spoke with at [facility] Agency: Kandee Keen, RNCM with [facility]  Prior Living Arrangements/Services Living arrangements for the past 2 months: Apartment Lives with:: Facility Resident Patient language and need for interpreter reviewed:: Yes Do you feel safe going back to the place where you live?: Yes      Need for Family Participation in Patient Care: Yes (Comment) Care giver support system in place?: Yes (comment) Current home services: DME, Home PT (DME at the home includes bathroom scale, Rolator, Home oxygen through [employer], Nebulizer, Bipap (not currently using), Patient receives PT at [facility] through Public relations account executive) Criminal Activity/Legal Involvement Pertinent to Current Situation/Hospitalization: No - Comment as needed  Activities of Daily Living Home Assistive Devices/Equipment: Environmental consultant (specify type) ADL Screening (condition at time of admission) Patient's cognitive ability adequate to safely complete daily activities?: Yes Is the patient deaf or have difficulty hearing?: No Does the patient have difficulty seeing, even when wearing glasses/contacts?: No Does the patient have difficulty concentrating, remembering, or making decisions?: No Patient able to express need for assistance with ADLs?: Yes Does the patient have difficulty dressing or bathing?: No Independently performs ADLs?: No Communication: Independent Dressing (OT): Independent Grooming: Independent Feeding: Independent Bathing: Appropriate for developmental age, Needs assistance Is this a change from baseline?: Change from baseline, expected to last <3 days Toileting: Needs assistance Is  this a change from baseline?: Change from baseline, expected to last <3 days In/Out Bed: Independent with device (comment) Walks in Home: Independent with device (comment) Does the patient have difficulty walking or climbing stairs?: No Weakness of Legs: None Weakness of Arms/Hands:  None  Permission Sought/Granted Permission sought to share information with : Case Manager, Magazine features editor, Family Supports Permission granted to share information with : Yes, Verbal Permission Granted     Permission granted to share info w AGENCY: [facility] agency for RN        Emotional Assessment Appearance:: Appears stated age Attitude/Demeanor/Rapport: Gracious Affect (typically observed): Accepting Orientation: : Oriented to Self, Oriented to Place, Oriented to  Time, Oriented to Situation Alcohol / Substance Use: Not Applicable Psych Involvement: No (comment)  Admission diagnosis:  Acute exacerbation of chronic obstructive pulmonary disease (COPD) (HCC) [J44.1] COPD exacerbation (HCC) [J44.1] Patient Active Problem List   Diagnosis Date Noted   Acute exacerbation of chronic obstructive pulmonary disease (COPD) (HCC) 01/13/2023   Dysuria 11/26/2022   Hypercoagulable state due to persistent atrial fibrillation (HCC) 11/18/2022   Acute on chronic heart failure with preserved ejection fraction (HCC) 10/26/2022   COPD with acute exacerbation (HCC) 10/09/2022   Pain in left shoulder 07/23/2022   Chronic obstructive pulmonary disease (HCC) 06/28/2022   Palliative care encounter 06/28/2022   Post-viral cough syndrome 06/02/2022   Paranasal sinus disease 05/19/2022   Heart failure with reduced ejection fraction (HCC) 05/14/2022   Pneumonia 04/29/2022   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   Depression, major, single episode, moderate (HCC) 04/27/2022   Non-STEMI (non-ST elevated myocardial infarction) (HCC) 03/18/2022   Hypotension 03/18/2022   Morbid obesity (HCC) 03/18/2022   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   Low TSH level 12/02/2021   Acute idiopathic gout of left hand 11/19/2021   Acquired hypothyroidism 11/12/2021   Encounter for palliative care involving management of pain 11/12/2021   Post concussive syndrome 11/11/2021   Chronic left-sided low back pain  11/04/2021   Gout 10/01/2021   Hyperlipidemia with target LDL less than 100 08/07/2021   Chronic anticoagulation 07/07/2021   Stage 3a chronic kidney disease (HCC) 05/23/2021   Dietary iron deficiency 05/23/2021   Age-related osteoporosis without current pathological fracture 04/22/2021   Type 2 diabetes mellitus with diabetic neuropathy, without long-term current use of insulin (HCC) 04/22/2021   Sleep apnea 01/22/2021   Statin intolerance 10/12/2019   Constipation    Statin myopathy 02/24/2019   Chronic systolic heart failure (HCC) 04/06/2018   HTN (hypertension) 04/06/2018   Atrial fibrillation (HCC) 04/06/2018   Chronic respiratory failure with hypoxia (HCC) 03/28/2018   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 [place] [address] Phone: [phone] Fax: [phone]  [facility] - [place], [place] - [address] [facility] [employer]. [address] [place] [address] Phone: [phone] Fax: [phone]     Social Determinants of Health (SDOH) Social History: SDOH Screenings   Food Insecurity: No Food Insecurity (01/14/2023)  Housing: Low Risk  (01/14/2023)  Transportation Needs: No Transportation Needs (01/14/2023)  Utilities: Not At Risk (01/14/2023)  Alcohol Screen: Low Risk  (05/21/2022)  Depression (PHQ2-9): Low Risk  (  05/21/2022)  Financial Resource Strain: Low Risk  (05/21/2022)  Physical Activity: Insufficiently Active (05/21/2022)  Social Connections: Moderately Isolated (05/21/2022)  Stress: No Stress Concern Present (05/21/2022)  Tobacco Use: Medium Risk (01/14/2023)    SDOH Interventions:     Readmission Risk Interventions    01/14/2023   11:16 AM 05/04/2022    3:37 PM  Readmission Risk Prevention Plan  Transportation Screening Complete Complete  PCP or Specialist Appt within 5-7 Days Complete Complete  Home Care Screening Complete Complete  Medication Review (RN CM) Complete Complete

## 2023-01-14 NOTE — Progress Notes (Signed)
PROGRESS NOTE  Kathleen [name]  QMV:[id number] DOB: [birth year] DOA: 01/13/2023 PCP: [name], MD   Brief Narrative: Patient is a 77 year old female with history of COPD, systolic dysfunction with EF of 35%, chronic hypoxic respiratory failure on 3 to 4 L of oxygen at home who presented from home with shortness of breath.  She was requiring up to 5 L at home, was advised by her pulmonologist to go to the ER.  On presentation ,she was saturating at 90s on 5 L.  Chest x-ray showed stable cardiomegaly, bibasilar atelectasis, small left pleural effusion, no infiltrate.  Patient was admitted for the management of acute COPD exacerbation  Assessment & Plan:  Principal Problem:   Acute exacerbation of chronic obstructive pulmonary disease (COPD) (HCC) Active Problems:   Chronic respiratory failure with hypoxia (HCC)   Chronic systolic heart failure (HCC)   HTN (hypertension)   Atrial fibrillation (HCC)   Sleep apnea   Type 2 diabetes mellitus with diabetic neuropathy, without long-term current use of insulin (HCC)   Stage 3a chronic kidney disease (HCC)   Hyperlipidemia with target LDL less than 100   Acquired hypothyroidism   Morbid obesity (HCC)   Acute on chronic hypoxic respiratory failure: On 3 to 4 L of oxygen at home.  Secondary to COPD exacerbation.  Continue steroids, bronchodilators, wean the oxygen to home requirement  Acute COPD exacerbation: Continue steroids, bronchodilators.  Also started antibiotic: ceftriaxone  Chronic systolic CHF: Currently appears compensated.  Last EF of 30 to 35% as per echo in April.  On torsemide, Aldactone, losartan.  No crackles auscultated, no does not have significant leg edema  Hypertension: Blood pressure stable.  Continue current medications  Paroxysmal A-fib: Monitor on telemetry.  On Xarelto for anticoagulation.  Also on Tikosyn.  Currently rate is well controlled  Type 2 diabetes: Continue sliding scale.  Monitor blood  sugars  Hyperlipidemia: Intolerant to statin.  Acquired hypothyroidism: On  levothyroxine  Morbid obesity: BMI of 37.9  CKD stage IIIa: Currently kidney function at baseline        DVT prophylaxis:rivaroxaban (XARELTO) tablet 20 mg Start: 01/14/23 1700 rivaroxaban (XARELTO) tablet 20 mg     Code Status: Full Code  Family Communication: Called and discussed with son [name] on phone on 8/8  Patient status:Inpatient  Patient is from :home  Anticipated discharge MW:UXLK  Estimated DC date:1-2 days   Consultants: None  Procedures:None  Antimicrobials:  Anti-infectives (From admission, onward)    Start     Dose/Rate Route Frequency Ordered Stop   01/14/23 0030  cefTRIAXone (ROCEPHIN) 1 g in sodium chloride 0.9 % 100 mL IVPB        1 g 200 mL/hr over 30 Minutes Intravenous Every 24 hours 01/13/23 2339 01/19/23 0029   01/14/23 0030  azithromycin (ZITHROMAX) 500 mg in sodium chloride 0.9 % 250 mL IVPB  Status:  Discontinued        500 mg 250 mL/hr over 60 Minutes Intravenous Every 24 hours 01/13/23 2339 01/14/23 0116       Subjective: Patient seen and examined at bedside today.  Hemodynamically stable.  She was working with physical therapy.  She complains of dyspnea.  She was speaking in full sentences and was maintaining her saturation on 4-5 L of oxygen.  She was not coughing a lot during my evaluation.  Does not look volume overloaded  Objective: Vitals:   01/14/23 0144 01/14/23 0459 01/14/23 0720 01/14/23 0758  BP:  (!) 106/50  119/68  Pulse:  70  70  Resp:  17 18 16   Temp:  97.9 F (36.6 C)  97.6 F (36.4 C)  TempSrc:    Oral  SpO2: 99% 98%  100%  Weight:      Height:        Intake/Output Summary (Last 24 hours) at 01/14/2023 0800 Last data filed at 01/14/2023 0348 Gross per 24 hour  Intake 100 ml  Output 500 ml  Net -400 ml   Filed Weights   01/13/23 1550  Weight: 88 kg    Examination:  General exam: Overall comfortable, not in  distress,obese HEENT: PERRL Respiratory system: Diminished sounds bilaterally, no frank wheezes or crackles  Cardiovascular system: S1 & S2 heard, RRR.  Gastrointestinal system: Abdomen is nondistended, soft and nontender. Central nervous system: Alert and oriented Extremities: No edema, no clubbing ,no cyanosis Skin: No rashes, no ulcers,no icterus     Data Reviewed: I have personally reviewed following labs and imaging studies  CBC: Recent Labs  Lab 01/13/23 1718  WBC 11.3*  NEUTROABS 10.1*  HGB 11.5*  HCT 34.8*  MCV 85.3  PLT 241   Basic Metabolic Panel: Recent Labs  Lab 01/13/23 1718  NA 139  K 4.5  CL 106  CO2 24  GLUCOSE 156*  BUN 22  CREATININE 0.68  CALCIUM 10.5*     Recent Results (from the past 240 hour(s))  SARS Coronavirus 2 by RT PCR (hospital order, performed in [facility] hospital lab) *cepheid single result test* Anterior Nasal Swab     Status: None   Collection Time: 01/13/23  5:18 PM   Specimen: Anterior Nasal Swab  Result Value Ref Range Status   SARS Coronavirus 2 by RT PCR NEGATIVE NEGATIVE Final    Comment: (NOTE) SARS-CoV-2 target nucleic acids are NOT DETECTED.  The SARS-CoV-2 RNA is generally detectable in upper and lower respiratory specimens during the acute phase of infection. The lowest concentration of SARS-CoV-2 viral copies this assay can detect is 250 copies / mL. A negative result does not preclude SARS-CoV-2 infection and should not be used as the sole basis for treatment or other patient management decisions.  A negative result may occur with improper specimen collection / handling, submission of specimen other than nasopharyngeal swab, presence of viral mutation(s) within the areas targeted by this assay, and inadequate number of viral copies (<250 copies / mL). A negative result must be combined with clinical observations, patient history, and epidemiological information.  Fact Sheet for Patients:    RoadLapTop.co.za  Fact Sheet for Healthcare Providers: http://kim-miller.com/  This test is not yet approved or  cleared by the [place] FDA and has been authorized for detection and/or diagnosis of SARS-CoV-2 by FDA under an Emergency Use Authorization (EUA).  This EUA will remain in effect (meaning this test can be used) for the duration of the COVID-19 declaration under Section 564(b)(1) of the Act, 21 U.S.C. section 360bbb-3(b)(1), unless the authorization is terminated or revoked sooner.  Performed at [facility], [address] [place], [place] [address]      Radiology Studies: [facility] 1 View  Result Date: 01/13/2023 CLINICAL DATA:  Shortness of breath. EXAM: PORTABLE CHEST 1 VIEW COMPARISON:  December 21, 2022 FINDINGS: There is stable multi lead AICD positioning. The cardiac silhouette is mildly enlarged and unchanged in size. Mild, diffuse, chronic appearing increased interstitial lung markings are seen. Mild atelectasis is noted within the bilateral lung bases. A small left pleural effusion is seen.  No pneumothorax is identified. Degenerative changes seen involving both shoulders and throughout the thoracic spine. IMPRESSION: 1. Stable cardiomegaly with mild bibasilar atelectasis. 2. Small left pleural effusion. Electronically Signed   By: Aram Candela M.D.   On: 01/13/2023 17:27    Scheduled Meds:  azelastine  2 spray Each Nare BID   Bempedoic Acid  1 tablet Oral Daily   cholecalciferol  2,000 Units Oral QHS   [START ON 02/03/2023] cyanocobalamin  1,000 mcg Intramuscular Q30 days   cyclobenzaprine  5 mg Oral QHS   docusate sodium  100 mg Oral BID   dofetilide  250 mcg Oral BID   febuxostat  40 mg Oral Daily   fluticasone furoate-vilanterol  1 puff Inhalation Daily   gabapentin  200 mg Oral QHS   guaiFENesin  600 mg Oral BID   ipratropium-albuterol  3 mL Nebulization BID   losartan  25 mg Oral  Daily   magnesium oxide  400 mg Oral QHS   methylPREDNISolone (SOLU-MEDROL) injection  125 mg Intravenous Q12H   Followed by   Melene Muller ON 01/15/2023] predniSONE  40 mg Oral Q breakfast   revefenacin  175 mcg Nebulization Daily   rivaroxaban  20 mg Oral Q supper   spironolactone  25 mg Oral Daily   torsemide  20 mg Oral Daily   Continuous Infusions:  cefTRIAXone (ROCEPHIN)  IV 1 g (01/14/23 0102)     LOS: 1 day   Burnadette Pop, MD [facility] P8/01/2023, 8:00 AM

## 2023-01-14 NOTE — Evaluation (Signed)
Physical Therapy Evaluation Patient Details Name: Kathleen [name] MRN: [id number] DOB: Jul 07, 1945 Today's Date: 01/14/2023  History of Present Illness  Pt is a 77 y.o. female presenting from pulmonologist via POV with SOB on 5L O2 (3L baseline). Admitted for acute exacerbation of COPD. PMH significant for COPD, systolic dysfunction CHF EF of 35% back in April, chronic respiratory failure on 3 to 4 L of oxygen at home.   Clinical Impression  Kathleen [name] is 77 y.o. female admitted with above HPI and diagnosis. Patient is currently limited by functional impairments below (see PT problem list). Patient lives at [facility] and is independent with rollator for mobility at baseline. Currently pt requires CGA for safety with transfers and gait; pt demonstrates safe use of rollator for ambulation with no cues needed to manage. Overall patient is close to baseline and limited by SOB with activity but able to ambulate ~160' with CGA and anticipate pt will make good progress with mobility. Patient will benefit from continued skilled PT interventions to address impairments and progress independence with mobility. Acute PT will follow and progress as able.         If plan is discharge home, recommend the following: Assistance with cooking/housework;Assist for transportation;Help with stairs or ramp for entrance   Can travel by private vehicle        Equipment Recommendations None recommended by PT  Recommendations for Other Services       Functional Status Assessment Patient has had a recent decline in their functional status and demonstrates the ability to make significant improvements in function in a reasonable and predictable amount of time.     Precautions / Restrictions Precautions Precautions: Fall Restrictions Weight Bearing Restrictions: No      Mobility  Bed Mobility               General bed mobility comments: pt in bathroom at start and then recliner at end of session.     Transfers Overall transfer level: Needs assistance Equipment used: None Transfers: Sit to/from Stand Sit to Stand: Contact guard assist           General transfer comment: CGA for safety, pt able to manage rollator once standing.    Ambulation/Gait Ambulation/Gait assistance: Contact guard assist Gait Distance (Feet): 160 Feet Assistive device: Rollator (4 wheels) Gait Pattern/deviations: Step-through pattern, Decreased stride length, Decreased dorsiflexion - left, Decreased dorsiflexion - right, Wide base of support, Trunk flexed Gait velocity: decr     General Gait Details: overall pt steady with safe management of rollator without cues. pt slightly more SOB at end of gait, on 4L/min throughout.  Stairs            Wheelchair Mobility     Tilt Bed    Modified Rankin (Stroke Patients Only)       Balance Overall balance assessment: Needs assistance Sitting-balance support: No upper extremity supported, Feet supported Sitting balance-Leahy Scale: Fair Sitting balance - Comments: able to perform figure 4   Standing balance support: Single extremity supported, During functional activity, Bilateral upper extremity supported, Reliant on assistive device for balance Standing balance-Leahy Scale: Poor                               Pertinent Vitals/Pain Pain Assessment Pain Assessment: No/denies pain Pain Intervention(s): Monitored during session    Home Living Family/patient expects to be discharged to:: Other (Comment) ([facility])  Home Equipment: Rollator (4 wheels);Cane - single point;Shower seat;Grab bars - tub/shower;Hand held shower head Additional Comments: [facility]. has shower chair, hand held shower head; walk in shower. elevators within the home    Prior Function Prior Level of Function : Independent/Modified Independent             Mobility Comments: no AD to walk within the home. Last 3 weeks, has been  rather sedentary ADLs Comments: 4L O2 at baseline when up moving around. Ind with ADL, bill payment, meds, and meal prep.  Assist with home management.     Extremity/Trunk Assessment   Upper Extremity Assessment Upper Extremity Assessment: Defer to OT evaluation RUE Deficits / Details: Prior rotator cuff surgery per pt (20 years ago) has recently received PT. Able to perform forward flexion to ~110 degreesn AROM, but unable to tolerate challenge/MMT    Lower Extremity Assessment Lower Extremity Assessment: Generalized weakness (functional for transfers and gait)    Cervical / Trunk Assessment Cervical / Trunk Assessment: Lordotic;Other exceptions Cervical / Trunk Exceptions: habitus  Communication   Communication Communication: No apparent difficulties Cueing Techniques: Verbal cues (to stay on task)  Cognition Arousal: Alert Behavior During Therapy: WFL for tasks assessed/performed Overall Cognitive Status: Within Functional Limits for tasks assessed                                          General Comments General comments (skin integrity, edema, etc.): HR in 80's with gait, denies weakness/dizziness with activity    Exercises     Assessment/Plan    PT Assessment Patient needs continued PT services  PT Problem List Decreased strength;Decreased range of motion;Decreased activity tolerance;Decreased balance;Decreased mobility;Decreased coordination;Decreased cognition;Decreased knowledge of use of DME;Decreased safety awareness;Decreased knowledge of precautions;Cardiopulmonary status limiting activity;Obesity       PT Treatment Interventions DME instruction;Gait training;Stair training;Functional mobility training;Therapeutic activities;Therapeutic exercise;Balance training;Neuromuscular re-education;Cognitive remediation;Patient/family education    PT Goals (Current goals can be found in the Care Plan section)  Acute Rehab PT Goals Patient Stated Goal:  improve breathing and get back home PT Goal Formulation: With patient Time For Goal Achievement: 01/28/23 Potential to Achieve Goals: Good    Frequency Min 1X/week     Co-evaluation               AM-PAC PT "6 Clicks" Mobility  Outcome Measure Help needed turning from your back to your side while in a flat bed without using bedrails?: A Little Help needed moving from lying on your back to sitting on the side of a flat bed without using bedrails?: A Little Help needed moving to and from a bed to a chair (including a wheelchair)?: A Little Help needed standing up from a chair using your arms (e.g., wheelchair or bedside chair)?: A Little Help needed to walk in hospital room?: A Little Help needed climbing 3-5 steps with a railing? : A Little 6 Click Score: 18    End of Session Equipment Utilized During Treatment: Gait belt;Oxygen Activity Tolerance: Patient tolerated treatment well Patient left: in chair;with call bell/phone within reach;with chair alarm set Nurse Communication: Mobility status PT Visit Diagnosis: Unsteadiness on feet (R26.81);Muscle weakness (generalized) (M62.81);Difficulty in walking, not elsewhere classified (R26.2);Other abnormalities of gait and mobility (R26.89)    Time: 1308-6578 PT Time Calculation (min) (ACUTE ONLY): 20 min   Charges:   PT Evaluation $PT Eval Low Complexity: 1  Low   PT General Charges $$ ACUTE PT VISIT: 1 Visit         Wynn Maudlin, DPT Acute Rehabilitation Services Office [phone]  01/14/23 12:31 PM

## 2023-01-15 DIAGNOSIS — J441 Chronic obstructive pulmonary disease with (acute) exacerbation: Secondary | ICD-10-CM | POA: Diagnosis not present

## 2023-01-15 MED ORDER — FUROSEMIDE 10 MG/ML IJ SOLN
40.0000 mg | Freq: Once | INTRAMUSCULAR | Status: AC
  Administered 2023-01-15: 40 mg via INTRAVENOUS

## 2023-01-15 MED ORDER — ALBUTEROL SULFATE (2.5 MG/3ML) 0.083% IN NEBU
2.5000 mg | INHALATION_SOLUTION | Freq: Two times a day (BID) | RESPIRATORY_TRACT | Status: DC
  Filled 2023-01-15 (×2): qty 3

## 2023-01-15 NOTE — Progress Notes (Signed)
PROGRESS NOTE  FALLEN DOHNER  MWN:[id number] DOB: [birth year] DOA: 01/13/2023 PCP: [name], MD   Brief Narrative: Patient is a 77 year old female with history of COPD, systolic dysfunction with EF of 35%, chronic hypoxic respiratory failure on 3 to 4 L of oxygen at home who presented from home with shortness of breath.  She was requiring up to 5 L at home, was advised by her pulmonologist to go to the ER.  On presentation ,she was saturating at 90s on 5 L.  Chest x-ray showed stable cardiomegaly, bibasilar atelectasis, small left pleural effusion, no infiltrate.  Patient was admitted for the management of acute COPD exacerbation.  Clinically improving, now at baseline oxygen requirement.  Plan for discharge tomorrow to home  Assessment & Plan:  Principal Problem:   Acute exacerbation of chronic obstructive pulmonary disease (COPD) (HCC) Active Problems:   Chronic respiratory failure with hypoxia (HCC)   Chronic systolic heart failure (HCC)   HTN (hypertension)   Atrial fibrillation (HCC)   Sleep apnea   Type 2 diabetes mellitus with diabetic neuropathy, without long-term current use of insulin (HCC)   Stage 3a chronic kidney disease (HCC)   Hyperlipidemia with target LDL less than 100   Acquired hypothyroidism   Morbid obesity (HCC)   Acute on chronic hypoxic respiratory failure: On 3 to 4 L of oxygen at home.  Secondary to COPD exacerbation.  Continue steroids, bronchodilators.  Now back to 4 L of oxygen.  Acute COPD exacerbation: Continue steroids, bronchodilators.  Also started antibiotic: ceftriaxone.  Feels better today.  No significant wheezing.  Steroids changed to oral.  Chronic systolic CHF: Currently appears compensated.  Last EF of 30 to 35% as per echo in April.  On torsemide, Aldactone, losartan at home.  No crackles auscultated, no does not have significant leg edema.  Will give a dose of Lasix 40 mg IV once today.  Hypertension: Blood pressure stable.  Continue  current medications  Paroxysmal A-fib: Monitor on telemetry.  On Xarelto for anticoagulation.  Also on Tikosyn.  Currently rate is well controlled.  QTc of 446  Type 2 diabetes: Continue sliding scale.  Monitor blood sugars  Hyperlipidemia: Intolerant to statin.  Acquired hypothyroidism: On  levothyroxine  Morbid obesity: BMI of 37.9  CKD stage IIIa: Currently kidney function at baseline   Nutrition Problem: Increased nutrient needs Etiology: chronic illness (COPD)    DVT prophylaxis:rivaroxaban (XARELTO) tablet 20 mg Start: 01/14/23 1700 rivaroxaban (XARELTO) tablet 20 mg     Code Status: Full Code  Family Communication: Called and discussed with son [name] on phone on 8/8  Patient status:Inpatient  Patient is from :home  Anticipated discharge QI:HKVQ  Estimated DC date: Tomorrow   Consultants: None  Procedures:None  Antimicrobials:  Anti-infectives (From admission, onward)    Start     Dose/Rate Route Frequency Ordered Stop   01/14/23 0030  cefTRIAXone (ROCEPHIN) 1 g in sodium chloride 0.9 % 100 mL IVPB        1 g 200 mL/hr over 30 Minutes Intravenous Every 24 hours 01/13/23 2339 01/19/23 0029   01/14/23 0030  azithromycin (ZITHROMAX) 500 mg in sodium chloride 0.9 % 250 mL IVPB  Status:  Discontinued        500 mg 250 mL/hr over 60 Minutes Intravenous Every 24 hours 01/13/23 2339 01/14/23 0116       Subjective: Patient seen and examined the bedside today.  Hemodynamically stable.  Looks comfortable than yesterday.  Sitting at the edge of  the bed.  Not coughing much.  No wheezing auscultated.  No peripheral edema.  We discussed about discharge planning tomorrow to home if she remains comfortable tomorrow morning  Objective: Vitals:   01/14/23 0758 01/14/23 1559 01/15/23 0032 01/15/23 0801  BP: 119/68 134/73 (!) 102/54 121/69  Pulse: 70 70 70 70  Resp: 16 15 16 18   Temp: 97.6 F (36.4 C) 98.4 F (36.9 C) 97.7 F (36.5 C) 98.2 F (36.8 C)  TempSrc:  Oral Oral    SpO2: 100% 99% 97% 100%  Weight:      Height:        Intake/Output Summary (Last 24 hours) at 01/15/2023 1112 Last data filed at 01/15/2023 4098 Gross per 24 hour  Intake 340 ml  Output --  Net 340 ml   Filed Weights   01/13/23 1550  Weight: 88 kg    Examination:   General exam: Overall comfortable, not in distress,obese HEENT: PERRL Respiratory system:  no wheezes or crackles  Cardiovascular system: S1 & S2 heard, RRR.  Gastrointestinal system: Abdomen is distended, soft and nontender. Central nervous system: Alert and oriented Extremities: trace lower extremity edema, no clubbing ,no cyanosis Skin: No rashes, no ulcers,no icterus     Data Reviewed: I have personally reviewed following labs and imaging studies  CBC: Recent Labs  Lab 01/13/23 1718 01/14/23 0659  WBC 11.3* 11.3*  NEUTROABS 10.1*  --   HGB 11.5* 10.4*  HCT 34.8* 32.6*  MCV 85.3 87.6  PLT 241 254   Basic Metabolic Panel: Recent Labs  Lab 01/13/23 1718 01/14/23 0659  NA 139 137  K 4.5 4.0  CL 106 104  CO2 24 24  GLUCOSE 156* 158*  BUN 22 19  CREATININE 0.68 0.73  CALCIUM 10.5* 10.2  MG  --  2.1     Recent Results (from the past 240 hour(s))  SARS Coronavirus 2 by RT PCR (hospital order, performed in [facility] hospital lab) *cepheid single result test* Anterior Nasal Swab     Status: None   Collection Time: 01/13/23  5:18 PM   Specimen: Anterior Nasal Swab  Result Value Ref Range Status   SARS Coronavirus 2 by RT PCR NEGATIVE NEGATIVE Final    Comment: (NOTE) SARS-CoV-2 target nucleic acids are NOT DETECTED.  The SARS-CoV-2 RNA is generally detectable in upper and lower respiratory specimens during the acute phase of infection. The lowest concentration of SARS-CoV-2 viral copies this assay can detect is 250 copies / mL. A negative result does not preclude SARS-CoV-2 infection and should not be used as the sole basis for treatment or other patient management decisions.   A negative result may occur with improper specimen collection / handling, submission of specimen other than nasopharyngeal swab, presence of viral mutation(s) within the areas targeted by this assay, and inadequate number of viral copies (<250 copies / mL). A negative result must be combined with clinical observations, patient history, and epidemiological information.  Fact Sheet for Patients:   RoadLapTop.co.za  Fact Sheet for Healthcare Providers: http://kim-miller.com/  This test is not yet approved or  cleared by the [place] FDA and has been authorized for detection and/or diagnosis of SARS-CoV-2 by FDA under an Emergency Use Authorization (EUA).  This EUA will remain in effect (meaning this test can be used) for the duration of the COVID-19 declaration under Section 564(b)(1) of the Act, 21 U.S.C. section 360bbb-3(b)(1), unless the authorization is terminated or revoked sooner.  Performed at [facility], [phone]  [address], [place], [place] [address]      Radiology Studies: DG Chest Port 1 View  Result Date: 01/13/2023 CLINICAL DATA:  Shortness of breath. EXAM: PORTABLE CHEST 1 VIEW COMPARISON:  December 21, 2022 FINDINGS: There is stable multi lead AICD positioning. The cardiac silhouette is mildly enlarged and unchanged in size. Mild, diffuse, chronic appearing increased interstitial lung markings are seen. Mild atelectasis is noted within the bilateral lung bases. A small left pleural effusion is seen. No pneumothorax is identified. Degenerative changes seen involving both shoulders and throughout the thoracic spine. IMPRESSION: 1. Stable cardiomegaly with mild bibasilar atelectasis. 2. Small left pleural effusion. Electronically Signed   By: Aram Candela M.D.   On: 01/13/2023 17:27    Scheduled Meds:  albuterol  2.5 mg Nebulization BID   azelastine  2 spray Each Nare BID   cholecalciferol  2,000 Units Oral  QHS   [START ON 02/03/2023] cyanocobalamin  1,000 mcg Intramuscular Q30 days   cyclobenzaprine  5 mg Oral QHS   docusate sodium  100 mg Oral BID   dofetilide  250 mcg Oral BID   febuxostat  40 mg Oral Daily   feeding supplement  237 mL Oral BID BM   fluticasone furoate-vilanterol  1 puff Inhalation Daily   gabapentin  200 mg Oral QHS   guaiFENesin  600 mg Oral BID   losartan  25 mg Oral Daily   magnesium oxide  400 mg Oral QHS   multivitamin with minerals  1 tablet Oral Daily   predniSONE  40 mg Oral Q breakfast   revefenacin  175 mcg Nebulization Daily   rivaroxaban  20 mg Oral Q supper   spironolactone  25 mg Oral Daily   Continuous Infusions:  cefTRIAXone (ROCEPHIN)  IV Stopped (01/15/23 0106)     LOS: 2 days   Burnadette Pop, MD [facility] P8/02/2023, 11:12 AM

## 2023-01-15 NOTE — Plan of Care (Signed)
  Problem: Education: Goal: Knowledge of General Education information will improve Description: Including pain rating scale, medication(s)/side effects and non-pharmacologic comfort measures Outcome: Progressing   Problem: Health Behavior/Discharge Planning: Goal: Ability to manage health-related needs will improve Outcome: Progressing   Problem: Clinical Measurements: Goal: Ability to maintain clinical measurements within normal limits will improve Outcome: Progressing Goal: Will remain free from infection Outcome: Progressing Goal: Diagnostic test results will improve Outcome: Progressing Goal: Respiratory complications will improve Outcome: Progressing Goal: Cardiovascular complication will be avoided Outcome: Progressing   Problem: Activity: Goal: Risk for activity intolerance will decrease Outcome: Progressing   Problem: Nutrition: Goal: Adequate nutrition will be maintained Outcome: Progressing   Problem: Pain Managment: Goal: General experience of comfort will improve Outcome: Progressing   Problem: Safety: Goal: Ability to remain free from injury will improve Outcome: Progressing   

## 2023-01-15 NOTE — Care Management Important Message (Signed)
Important Message  Patient Details  Name: MCKENZI ZIESER MRN: [id number] Date of Birth: 1946-05-18   Medicare Important Message Given:  Yes     Dorena Bodo 01/15/2023, 3:22 PM

## 2023-01-16 ENCOUNTER — Inpatient Hospital Stay (HOSPITAL_COMMUNITY): Payer: Medicare Other

## 2023-01-16 DIAGNOSIS — J441 Chronic obstructive pulmonary disease with (acute) exacerbation: Secondary | ICD-10-CM | POA: Diagnosis not present

## 2023-01-16 MED ORDER — BUDESONIDE 0.5 MG/2ML IN SUSP
0.5000 mg | Freq: Two times a day (BID) | RESPIRATORY_TRACT | Status: DC
  Administered 2023-01-16 – 2023-01-19 (×6): 0.5 mg via RESPIRATORY_TRACT
  Filled 2023-01-16 (×6): qty 2

## 2023-01-16 MED ORDER — HYDROCOD POLI-CHLORPHE POLI ER 10-8 MG/5ML PO SUER
5.0000 mL | Freq: Two times a day (BID) | ORAL | Status: DC
  Administered 2023-01-16 – 2023-01-17 (×4): 5 mL via ORAL
  Filled 2023-01-16 (×4): qty 5

## 2023-01-16 MED ORDER — TORSEMIDE 20 MG PO TABS
20.0000 mg | ORAL_TABLET | Freq: Every day | ORAL | Status: DC
  Administered 2023-01-16 – 2023-01-19 (×4): 20 mg via ORAL
  Filled 2023-01-16 (×4): qty 1

## 2023-01-16 MED ORDER — HYDROCOD POLI-CHLORPHE POLI ER 10-8 MG/5ML PO SUER
5.0000 mL | Freq: Two times a day (BID) | ORAL | Status: DC | PRN
  Administered 2023-01-16: 5 mL via ORAL
  Filled 2023-01-16: qty 5

## 2023-01-16 MED ORDER — LEVALBUTEROL HCL 0.63 MG/3ML IN NEBU
0.6300 mg | INHALATION_SOLUTION | RESPIRATORY_TRACT | Status: DC | PRN
  Administered 2023-01-17: 0.63 mg via RESPIRATORY_TRACT
  Filled 2023-01-16 (×2): qty 3

## 2023-01-16 MED ORDER — LEVALBUTEROL HCL 1.25 MG/0.5ML IN NEBU
1.2500 mg | INHALATION_SOLUTION | Freq: Two times a day (BID) | RESPIRATORY_TRACT | Status: DC
  Administered 2023-01-16 – 2023-01-19 (×5): 1.25 mg via RESPIRATORY_TRACT
  Filled 2023-01-16 (×9): qty 0.5

## 2023-01-16 NOTE — Progress Notes (Signed)
PROGRESS NOTE  Kathleen [name]  IEP:[id number] DOB: 02/03/1946 DOA: 01/13/2023 PCP: [name], MD   Brief Narrative: Patient is a 77 year old female with history of COPD, systolic dysfunction with EF of 35%, chronic hypoxic respiratory failure on 3 to 4 L of oxygen at home who presented from home with shortness of breath.  She was requiring up to 5 L at home, was advised by her pulmonologist to go to the ER.  On presentation ,she was saturating at 90s on 5 L.  Chest x-ray showed stable cardiomegaly, bibasilar atelectasis, small left pleural effusion, no infiltrate.  Patient was admitted for the management of acute COPD exacerbation.  Patient has been weaned to her baseline oxygen requirement but she continues to be bothered with severe cough.  Pulmonology consulted today.  Assessment & Plan:  Principal Problem:   Acute exacerbation of chronic obstructive pulmonary disease (COPD) (HCC) Active Problems:   Chronic respiratory failure with hypoxia (HCC)   Chronic systolic heart failure (HCC)   HTN (hypertension)   Atrial fibrillation (HCC)   Sleep apnea   Type 2 diabetes mellitus with diabetic neuropathy, without long-term current use of insulin (HCC)   Stage 3a chronic kidney disease (HCC)   Hyperlipidemia with target LDL less than 100   Acquired hypothyroidism   Morbid obesity (HCC)   Acute on chronic hypoxic respiratory failure: On 3 to 4 L of oxygen at home.  Secondary to COPD exacerbation.  Continue steroids, bronchodilators.  Now back to 4 L of oxygen.  Acute COPD exacerbation: Continue steroids, bronchodilators.  Also started antibiotic: ceftriaxone.   No significant wheezing.  Steroids changed to oral.  Feels worse today.  Bothered with significant hacking cough.  Pulmonology consulted today.  Chronic systolic CHF: Currently appears compensated.  Last EF of 30 to 35% as per echo in April.  On torsemide, Aldactone, losartan at home.  No crackles auscultated,  does not have  significant leg edema.  Given  a dose of Lasix 40 mg IV on 8/9.  Restarted home torsemide.  Hypertension: Blood pressure stable.  Continue current medications  Paroxysmal A-fib: Monitor on telemetry.  On Xarelto for anticoagulation.  Also on Tikosyn.  Currently rate is well controlled. Last  QTc of 446  Type 2 diabetes: Continue sliding scale.  Monitor blood sugars  Hyperlipidemia: Intolerant to statin.  Acquired hypothyroidism: On  levothyroxine  Morbid obesity: BMI of 37.9  CKD stage IIIa: Currently kidney function at baseline   Nutrition Problem: Increased nutrient needs Etiology: chronic illness (COPD)    DVT prophylaxis:rivaroxaban (XARELTO) tablet 20 mg Start: 01/14/23 1700 rivaroxaban (XARELTO) tablet 20 mg     Code Status: Full Code  Family Communication: Called and discussed with son [name] on phone on 8/8  Patient status:Inpatient  Patient is from :home  Anticipated discharge YS:AYTK  Estimated DC date: Tomorrow   Consultants: Pulmonology  Procedures:None  Antimicrobials:  Anti-infectives (From admission, onward)    Start     Dose/Rate Route Frequency Ordered Stop   01/14/23 0030  cefTRIAXone (ROCEPHIN) 1 g in sodium chloride 0.9 % 100 mL IVPB        1 g 200 mL/hr over 30 Minutes Intravenous Every 24 hours 01/13/23 2339 01/19/23 0029   01/14/23 0030  azithromycin (ZITHROMAX) 500 mg in sodium chloride 0.9 % 250 mL IVPB  Status:  Discontinued        500 mg 250 mL/hr over 60 Minutes Intravenous Every 24 hours 01/13/23 2339 01/14/23 0116  Subjective: Patient seen and examined at bedside today.  She feels bad  today.  She started having bad hacking cough since yesterday.  She has not required more oxygen than 4 L/min which is her baseline.  She feels tired  Objective: Vitals:   01/16/23 0310 01/16/23 0500 01/16/23 0725 01/16/23 0900  BP: (!) 124/54  (!) 118/57   Pulse: 70  73 78  Resp: 18  15 20   Temp: 97.7 F (36.5 C)  97.7 F (36.5 C)    TempSrc: Oral     SpO2: 99%  97%   Weight:  88 kg    Height:       No intake or output data in the 24 hours ending 01/16/23 1029  Filed Weights   01/13/23 1550 01/16/23 0500  Weight: 88 kg 88 kg    Examination:  General exam:coughing, not in distress,obese HEENT: PERRL Respiratory system: Mild diminished sounds bilaterally, no significant wheezing, Cardiovascular system: S1 & S2 heard, RRR.  Gastrointestinal system: Abdomen is nondistended, soft and nontender. Central nervous system: Alert and oriented Extremities: trace bilateral lower extremity edema, no clubbing ,no cyanosis Skin: No rashes, no ulcers,no icterus     Data Reviewed: I have personally reviewed following labs and imaging studies  CBC: Recent Labs  Lab 01/13/23 1718 01/14/23 0659  WBC 11.3* 11.3*  NEUTROABS 10.1*  --   HGB 11.5* 10.4*  HCT 34.8* 32.6*  MCV 85.3 87.6  PLT 241 254   Basic Metabolic Panel: Recent Labs  Lab 01/13/23 1718 01/14/23 0659 01/16/23 0801  NA 139 137 137  K 4.5 4.0 4.0  CL 106 104 99  CO2 24 24 26   GLUCOSE 156* 158* 133*  BUN 22 19 40*  CREATININE 0.68 0.73 0.91  CALCIUM 10.5* 10.2 11.0*  MG  --  2.1  --      Recent Results (from the past 240 hour(s))  SARS Coronavirus 2 by RT PCR (hospital order, performed in [facility] hospital lab) *cepheid single result test* Anterior Nasal Swab     Status: None   Collection Time: 01/13/23  5:18 PM   Specimen: Anterior Nasal Swab  Result Value Ref Range Status   SARS Coronavirus 2 by RT PCR NEGATIVE NEGATIVE Final    Comment: (NOTE) SARS-CoV-2 target nucleic acids are NOT DETECTED.  The SARS-CoV-2 RNA is generally detectable in upper and lower respiratory specimens during the acute phase of infection. The lowest concentration of SARS-CoV-2 viral copies this assay can detect is 250 copies / mL. A negative result does not preclude SARS-CoV-2 infection and should not be used as the sole basis for treatment or other patient  management decisions.  A negative result may occur with improper specimen collection / handling, submission of specimen other than nasopharyngeal swab, presence of viral mutation(s) within the areas targeted by this assay, and inadequate number of viral copies (<250 copies / mL). A negative result must be combined with clinical observations, patient history, and epidemiological information.  Fact Sheet for Patients:   RoadLapTop.co.za  Fact Sheet for Healthcare Providers: http://kim-miller.com/  This test is not yet approved or  cleared by the [place] FDA and has been authorized for detection and/or diagnosis of SARS-CoV-2 by FDA under an Emergency Use Authorization (EUA).  This EUA will remain in effect (meaning this test can be used) for the duration of the COVID-19 declaration under Section 564(b)(1) of the Act, 21 U.S.C. section 360bbb-3(b)(1), unless the authorization is terminated or revoked sooner.  Performed at  [facility], [address], [place], [place] [address]      Radiology Studies: No results found.  Scheduled Meds:  azelastine  2 spray Each Nare BID   chlorpheniramine-HYDROcodone  5 mL Oral Q12H   cholecalciferol  2,000 Units Oral QHS   [START ON 02/03/2023] cyanocobalamin  1,000 mcg Intramuscular Q30 days   cyclobenzaprine  5 mg Oral QHS   docusate sodium  100 mg Oral BID   dofetilide  250 mcg Oral BID   febuxostat  40 mg Oral Daily   feeding supplement  237 mL Oral BID BM   fluticasone furoate-vilanterol  1 puff Inhalation Daily   gabapentin  200 mg Oral QHS   guaiFENesin  600 mg Oral BID   levalbuterol  1.25 mg Nebulization BID   losartan  25 mg Oral Daily   magnesium oxide  400 mg Oral QHS   multivitamin with minerals  1 tablet Oral Daily   predniSONE  40 mg Oral Q breakfast   revefenacin  175 mcg Nebulization Daily   rivaroxaban  20 mg Oral Q supper   spironolactone  25 mg Oral  Daily   Continuous Infusions:  cefTRIAXone (ROCEPHIN)  IV 1 g (01/16/23 0035)     LOS: 3 days   Burnadette Pop, MD [facility] P8/03/2023, 10:29 AM

## 2023-01-16 NOTE — Consult Note (Signed)
NAME:  Kathleen [name], MRN:  [id number], DOB:  May 25, 1946, LOS: 3 ADMISSION DATE:  01/13/2023, CONSULTATION DATE:  01/16/23 REFERRING MD:  Burnadette Pop, MD CHIEF COMPLAINT:  COPD exacerbation   History of Present Illness:  77 year old female with COPD, asthma, chronic respiratory failure 3L at rest and 4L on exertion at home, NIV admitted for COPD exacerbation on 8/7. Failed outpatient therapy x 2 rounds (7/15 and 7/23) with increased O2 requirement to 5L at rest. Previously able to cook and perform ADLs but unable to stand without feeling breathless. Unable to wear BiPAP due to congestion x 2 weeks. Has had 5lb weight gain in 24 hours, abdominal swelling  but reports not being able to diurese well recently. Denies leg swelling. In the ED SpO2 90s on  5L. CXR with small left pleural effusion otherwise no infiltrate. Admitted to [facility] for COPD exacerbation. Started on IV solumedrol, IV abx and nebulizers.  Since admission she reports feeling partially better however she developed cough and now again has worsening shortness of breath, fatigue and worsening cough. Given tussionex with some relief.  Pertinent  Medical History  COPD, asthma, chronic respiratory failure 3L at rest and 4L on exertion at home, NIV, atrial fibrillation, DM2, CKD IIIa, HLD, hypothyroidism, morbidity obese  Significant Hospital Events: Including procedures, antibiotic start and stop dates in addition to other pertinent events   8/7 Admit  Interim History / Subjective:  As above  Objective   Blood pressure (!) 118/57, pulse 78, temperature 97.7 F (36.5 C), resp. rate 20, height 5' (1.524 m), weight 88 kg, SpO2 97%.    FiO2 (%):  [36 %] 36 %  No intake or output data in the 24 hours ending 01/16/23 1518 Filed Weights   01/13/23 1550 01/16/23 0500  Weight: 88 kg 88 kg   Physical Exam: General: Chronically ill-appearing, no acute distress HENT: [place], AT, OP clear, MMM Eyes: EOMI, no scleral icterus Respiratory:  Diminished and congested to auscultation bilaterally.  No crackles, wheezing or rales Cardiovascular: RRR, -M/R/G, no JVD GI: BS+, soft, nontender Extremities:-Edema,-tenderness Neuro: AAO x4, CNII-XII grossly intact Psych: Normal mood, normal affect   Resolved Hospital Problem list    Assessment & Plan:   COPD-asthma exacerbation (FEV1 48%) - may be approaching end stage COPD Acute on chronic respiratory failure on 3L at baseline At baseline O2 however symptomatic. Focus on symptom relief at this point. Could consider chronic opioid therapy for management. Start with nebulized regimen first.  --Transition inhalers (Breo) to nebulizers: Budesonide BID and Yupelri daily. PRN Xopenex --Wean supplemental O2 for goal >88% --Agree with CAP coverage --PRN cough syrup  Pulmonary will continue to follow  Best Practice (right click and "Reselect all SmartList Selections" daily)  Per Primary:  Diet/type: Regular consistency (see orders) DVT prophylaxis: DOAC GI prophylaxis: N/A Lines: N/A Foley:  N/A Code Status:  full code Last date of multidisciplinary goals of care discussion [ per primary]  Labs   CBC: Recent Labs  Lab 01/13/23 1718 01/14/23 0659  WBC 11.3* 11.3*  NEUTROABS 10.1*  --   HGB 11.5* 10.4*  HCT 34.8* 32.6*  MCV 85.3 87.6  PLT 241 254    Basic Metabolic Panel: Recent Labs  Lab 01/13/23 1718 01/14/23 0659 01/16/23 0801  NA 139 137 137  K 4.5 4.0 4.0  CL 106 104 99  CO2 24 24 26   GLUCOSE 156* 158* 133*  BUN 22 19 40*  CREATININE 0.68 0.73 0.91  CALCIUM 10.5* 10.2  11.0*  MG  --  2.1  --    GFR: Estimated Creatinine Clearance: 51.9 mL/min (by C-G formula based on SCr of 0.91 mg/dL). Recent Labs  Lab 01/13/23 1718 01/14/23 0659  WBC 11.3* 11.3*    Liver Function Tests: Recent Labs  Lab 01/14/23 0659  AST 11*  ALT 10  ALKPHOS 29*  BILITOT 0.2*  PROT 5.8*  ALBUMIN 3.1*   No results for input(s): "LIPASE", "AMYLASE" in the last 168  hours. No results for input(s): "AMMONIA" in the last 168 hours.  ABG    Component Value Date/Time   PHART 7.343 (L) 03/20/2022 1316   PCO2ART 33.4 03/20/2022 1316   PO2ART 87 03/20/2022 1316   HCO3 18.1 (L) 03/20/2022 1316   TCO2 19 (L) 03/20/2022 1316   ACIDBASEDEF 7.0 (H) 03/20/2022 1316   O2SAT 96 03/20/2022 1316     Coagulation Profile: No results for input(s): "INR", "PROTIME" in the last 168 hours.  Cardiac Enzymes: No results for input(s): "CKTOTAL", "CKMB", "CKMBINDEX", "TROPONINI" in the last 168 hours.  HbA1C: Hemoglobin A1C  Date/Time Value Ref Range Status  12/26/2012 06:29 AM 6.6 (H) 4.2 - 6.3 % Final    Comment:    The American Diabetes Association recommends that a primary goal of therapy should be <7% and that physicians should reevaluate the treatment regimen in patients with HbA1c values consistently >8%.    Hgb A1c MFr Bld  Date/Time Value Ref Range Status  04/30/2022 01:58 AM 5.8 (H) 4.8 - 5.6 % Final    Comment:    (NOTE)         Prediabetes: 5.7 - 6.4         Diabetes: >6.4         Glycemic control for adults with diabetes: <7.0   08/07/2021 12:19 PM 5.9 4.6 - 6.5 % Final    Comment:    Glycemic Control Guidelines for People with Diabetes:Non Diabetic:  <6%Goal of Therapy: <7%Additional Action Suggested:  >8%     CBG: No results for input(s): "GLUCAP" in the last 168 hours.  Review of Systems:   Review of Systems  Constitutional:  Negative for chills, diaphoresis, fever, malaise/fatigue and weight loss.  HENT:  Negative for congestion.   Respiratory:  Positive for cough and shortness of breath. Negative for hemoptysis, sputum production and wheezing.   Cardiovascular:  Negative for chest pain, palpitations and leg swelling.     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   Surgical History:   Past Surgical History:  Procedure Laterality Date   ABDOMINAL HYSTERECTOMY     APPENDECTOMY     ATRIAL FIBRILLATION ABLATION  07/20/2013   by Dr Christin Fudge   AV nodal ablation  11/01/2013   by Dr Christin Fudge, repeated by Dr Wilford Grist   BREAST BIOPSY Bilateral 1997   negative   CARDIAC CATHETERIZATION     CHOLECYSTECTOMY     HERNIA REPAIR     PACEMAKER INSERTION  06/2017   MDT Viva CRT-P implanted by Dr Christin Fudge after AV nodal ablation,  LV lead could not be placed   PACEMAKER INSERTION  05/2020   with lead bundle   RIGHT/LEFT HEART CATH AND CORONARY  ANGIOGRAPHY N/A 03/20/2022   Procedure: RIGHT/LEFT HEART CATH AND CORONARY ANGIOGRAPHY;  Surgeon: Swaz[place], Peter M, MD;  Location: [facility] INVASIVE CV LAB;  Service: Cardiovascular;  Laterality: N/A;     Social History:   reports that she quit smoking about 35 years ago. Her smoking use included cigarettes. She started smoking about 55 years ago. She has a 20 pack-year smoking history. She has been exposed to tobacco smoke. She has never used smokeless tobacco. She reports that she does not drink alcohol and does not use drugs.   Family History:  Her family history includes Breast cancer in her cousin; Breast cancer (age of onset: 62) in her mother; Emphysema in her father; Healthy in her brother; Obesity in her daughter; Pancreatic cancer in her mother; Valvular heart disease in her son. There is no history of Colon cancer, Esophageal cancer, Stomach cancer, Inflammatory bowel disease, Liver disease, or Rectal cancer.   Allergies Allergies  Allergen Reactions   Allopurinol Other (See Comments)    Reaction:  Dizziness    Clindamycin Anaphylaxis and Hives    Flublok [Influenza Vaccine Recombinant] Other (See Comments)    Fever 103 with no alternative explanation day after vaccine.  Clydie Braun Highfill FNP-C   Pneumococcal 13-Val Conj Vacc Itching, Swelling and Rash   Dronedarone Rash   Brovana [Arformoterol]     Caused muscle pain   Budesonide     Caused extreme joint pain   Entresto [Sacubitril-Valsartan] Other (See Comments)    hypotension   Fosamax [Alendronate Sodium] Nausea Only   Jardiance [Empagliflozin]     Caused a vaginal infection   Meperidine Nausea And Vomiting   Microplegia Msa-Msg [Plegisol]     Loopy,diarrhea   Rosuvastatin Other (See Comments)    Reaction:  Muscle spasms    Tetracycline Hives   Adhesive [Tape] Rash   Lovastatin Rash and Other (See Comments)    Muscle Pain     Home Medications  Prior to Admission medications   Medication Sig Start Date End Date Taking? Authorizing Provider  acetaminophen (TYLENOL) 650 MG CR tablet Take 1,300 mg by mouth every 8 (eight) hours as needed for pain.   Yes [provider]  albuterol (PROVENTIL) (2.5 MG/3ML) 0.083% nebulizer solution Take 2.5 mg by nebulization every 6 (six) hours as needed for wheezing or shortness of breath. 06/18/22  Yes [provider]  albuterol (VENTOLIN HFA) 108 (90 Base) MCG/ACT inhaler Inhale 2 puffs into the lungs every 4 (four) hours as needed for wheezing or shortness of breath. 04/06/22  Yes Nyoka Cowden, MD  azelastine (ASTELIN) 0.1 % nasal spray Place 2 sprays into both nostrils 2 (two) times daily. 11/05/22  Yes Oretha Milch, MD  chlorpheniramine-HYDROcodone (TUSSIONEX) 10-8 MG/5ML Take 5 mLs by mouth at bedtime as needed for cough. 12/21/22  Yes Luciano Cutter, MD  Cholecalciferol (VITAMIN D3) 50 MCG (2000 UT) TABS Take 2,000 Units by mouth at bedtime.   Yes [provider]  cyanocobalamin (VITAMIN B12) 1000 MCG/ML injection Inject 1 mL (1,000 mcg total) into the muscle every 30 (thirty) days. 05/05/22  Yes [name], MD  docusate sodium (COLACE) 100 MG capsule Take 100 mg by mouth 2 (two) times daily.   Yes [provider]  dofetilide (TIKOSYN) 250 MCG capsule Take 1 capsule (250 mcg total) by mouth in the morning and at bedtime. 12/01/22 12/01/23 Yes Fenton, Clint R, PA  febuxostat (ULORIC) 40 MG tablet TAKE 1 TABLET(40 MG) BY MOUTH DAILY 12/30/22  Yes Rice, Jamesetta Orleans, MD  fluticasone furoate-vilanterol (BREO ELLIPTA) 100-25 MCG/ACT AEPB Inhale 1 puff into the lungs daily. 07/30/22  Yes Cobb, Ruby Cola, NP  gabapentin (NEURONTIN) 100 MG capsule Take 2 capsules (200 mg total) by mouth at bedtime. 11/03/22 06/01/23 Yes Camara, Amalia Hailey, MD  guaifenesin (HUMIBID E) 400 MG TABS tablet Take 400 mg by mouth every 4 (four) hours.   Yes [provider]  losartan (COZAAR) 25 MG tablet TAKE 1 TABLET(25 MG) BY MOUTH AT BEDTIME 12/07/22  Yes Bensimhon, [name], MD  Magnesium 500 MG TABS Take 500 mg by mouth at bedtime.   Yes [provider]  NEXLETOL 180 MG TABS TAKE 1 TABLET BY MOUTH DAILY 11/11/22  Yes [name], MD  potassium chloride (KLOR-CON) 10 MEQ tablet Take 1 tablet (10 mEq total) by mouth daily. 07/22/22 01/14/23 Yes Milford, [name], FNP  revefenacin (YUPELRI) 175 MCG/3ML nebulizer solution Take 3 mLs (175 mcg total) by nebulization daily. 11/05/22  Yes Oretha Milch, MD  spironolactone (ALDACTONE) 25 MG tablet TAKE 1 TABLET(25 MG) BY MOUTH EVERY MORNING 12/02/22  Yes Bensimhon, [name], MD  torsemide (DEMADEX) 20 MG tablet Take 1 tablet (20 mg total) by mouth daily. 07/22/22  Yes Milford, Jessica M, FNP  XARELTO 20 MG TABS tablet TAKE 1 TABLET(20 MG) BY MOUTH DAILY WITH SUPPER 07/13/22  Yes Bensimhon, [name], MD  OXYGEN Inhale 5 L into the lungs continuous. Use with resmed ventilator    [provider]  predniSONE (DELTASONE) 10 MG tablet Take 20 mg by mouth daily with breakfast. Patient not taking: Reported on 01/14/2023    [provider]     Critical  care time: N/A    Care Time: 69 min  Mechele Collin, M.D. [facility] 01/16/2023 3:18 PM   See Amion for personal pager For hours between 7 PM to 7 AM, please call Elink for urgent questions

## 2023-01-16 NOTE — Progress Notes (Signed)
TRH night cross cover note:   I was notified by RN that the patient is complaining of a cough, and that she conveys that, historically, Tussionex has been particularly effective for her as an antitussive.  Subsequently, I ordered prn Tussionex for her.     Newton Pigg, DO Hospitalist

## 2023-01-17 DIAGNOSIS — J441 Chronic obstructive pulmonary disease with (acute) exacerbation: Secondary | ICD-10-CM | POA: Diagnosis not present

## 2023-01-17 MED ORDER — BISACODYL 10 MG RE SUPP
10.0000 mg | Freq: Once | RECTAL | Status: AC
  Administered 2023-01-17: 10 mg via RECTAL
  Filled 2023-01-17: qty 1

## 2023-01-17 MED ORDER — SENNA 8.6 MG PO TABS
1.0000 | ORAL_TABLET | Freq: Two times a day (BID) | ORAL | Status: DC
  Administered 2023-01-17 – 2023-01-19 (×4): 8.6 mg via ORAL
  Filled 2023-01-17 (×5): qty 1

## 2023-01-17 MED ORDER — POLYETHYLENE GLYCOL 3350 17 G PO PACK
17.0000 g | PACK | Freq: Every day | ORAL | Status: DC
  Administered 2023-01-17 – 2023-01-19 (×3): 17 g via ORAL
  Filled 2023-01-17 (×3): qty 1

## 2023-01-17 MED ORDER — POLYETHYLENE GLYCOL 3350 17 G PO PACK
17.0000 g | PACK | Freq: Every day | ORAL | Status: DC | PRN
  Administered 2023-01-17: 17 g via ORAL
  Filled 2023-01-17: qty 1

## 2023-01-17 NOTE — Progress Notes (Signed)
PROGRESS NOTE  Kathleen [name]  ZOX:[id number] DOB: [birth year] DOA: 01/13/2023 PCP: [name], MD   Brief Narrative: Patient is a 77 year old female with history of COPD, systolic dysfunction with EF of 35%, chronic hypoxic respiratory failure on 3 to 4 L of oxygen at home who presented from home with shortness of breath.  She was requiring up to 5 L at home, was advised by her pulmonologist to go to the ER.  On presentation ,she was saturating at 90s on 5 L.  Chest x-ray showed stable cardiomegaly, bibasilar atelectasis, small left pleural effusion, no infiltrate.  Patient was admitted for the management of acute COPD exacerbation.  Patient has been weaned to her baseline oxygen requirement but she continued to be bothered with severe cough.  Pulmonology consulted and following.  Assessment & Plan:  Principal Problem:   Acute exacerbation of chronic obstructive pulmonary disease (COPD) (HCC) Active Problems:   Chronic respiratory failure with hypoxia (HCC)   Chronic systolic heart failure (HCC)   HTN (hypertension)   Atrial fibrillation (HCC)   Sleep apnea   Type 2 diabetes mellitus with diabetic neuropathy, without long-term current use of insulin (HCC)   Stage 3a chronic kidney disease (HCC)   Hyperlipidemia with target LDL less than 100   Acquired hypothyroidism   Morbid obesity (HCC)   Acute on chronic hypoxic respiratory failure: On 3 to 4 L of oxygen at home.  Secondary to COPD exacerbation.  Continue steroids, bronchodilators.  Now back to 4 L of oxygen.  Acute COPD exacerbation: Continue steroids, bronchodilators.  Also started antibiotic: ceftriaxone.   No significant wheezing.  Steroids changed to oral.    Bothered with significant hacking cough.  Pulmonology consulted  and following.  Continue Yupelri, budesonide,xopenex  Chronic systolic CHF: Currently appears compensated.  Last EF of 30 to 35% as per echo in April.  On torsemide, Aldactone, losartan at home.  No  crackles auscultated,  does not have significant leg edema.  Given  a dose of Lasix 40 mg IV on 8/9.  Restarted home torsemide.  Hypertension: Blood pressure stable.  Continue current medications  Paroxysmal A-fib: Monitor on telemetry.  On Xarelto for anticoagulation.  Also on Tikosyn.  Currently rate is well controlled. Last  QTc of 446  Type 2 diabetes: Continue sliding scale.  Monitor blood sugars  Hyperlipidemia: Intolerant to statin.  Acquired hypothyroidism: On  levothyroxine  Morbid obesity: BMI of 37.9  CKD stage IIIa: Currently kidney function at baseline   Nutrition Problem: Increased nutrient needs Etiology: chronic illness (COPD)    DVT prophylaxis:rivaroxaban (XARELTO) tablet 20 mg Start: 01/14/23 1700 rivaroxaban (XARELTO) tablet 20 mg     Code Status: Full Code  Family Communication: Called and discussed with son [name] on phone on 8/8  Patient status:Inpatient  Patient is from :home  Anticipated discharge WJ:XBJY  Estimated DC date: 1-2 days   Consultants: Pulmonology  Procedures:None  Antimicrobials:  Anti-infectives (From admission, onward)    Start     Dose/Rate Route Frequency Ordered Stop   01/14/23 0030  cefTRIAXone (ROCEPHIN) 1 g in sodium chloride 0.9 % 100 mL IVPB        1 g 200 mL/hr over 30 Minutes Intravenous Every 24 hours 01/13/23 2339 01/19/23 0029   01/14/23 0030  azithromycin (ZITHROMAX) 500 mg in sodium chloride 0.9 % 250 mL IVPB  Status:  Discontinued        500 mg 250 mL/hr over 60 Minutes Intravenous Every 24 hours 01/13/23 2339  01/14/23 0116       Subjective: Patient seen and examined at bedside today.  Hemodynamically stable.  She feels slightly better than yesterday.  Still has some hacking cough.  She does not have wheezing or crackles this morning.  She was standing near the basin  without oxygen when I entered the room.  Does not look short of breath.  Objective: Vitals:   01/16/23 2102 01/17/23 0440 01/17/23 0807  01/17/23 0950  BP: 121/60 (!) 107/52 117/62   Pulse: 75 70 72   Resp:  18 16   Temp:  (!) 97.5 F (36.4 C) 97.7 F (36.5 C)   TempSrc:  Oral Oral   SpO2:  100% 100% 95%  Weight:      Height:       No intake or output data in the 24 hours ending 01/17/23 1108  Filed Weights   01/13/23 1550 01/16/23 0500  Weight: 88 kg 88 kg    Examination:  General exam: Overall comfortable, not in distress,obese HEENT: PERRL Respiratory system:  no wheezes or crackles  Cardiovascular system: S1 & S2 heard, RRR.  Gastrointestinal system: Abdomen is nondistended, soft and nontender. Central nervous system: Alert and oriented Extremities: trace lower extremity non pitting edema, no clubbing ,no cyanosis Skin: No rashes, no ulcers,no icterus       Data Reviewed: I have personally reviewed following labs and imaging studies  CBC: Recent Labs  Lab 01/13/23 1718 01/14/23 0659  WBC 11.3* 11.3*  NEUTROABS 10.1*  --   HGB 11.5* 10.4*  HCT 34.8* 32.6*  MCV 85.3 87.6  PLT 241 254   Basic Metabolic Panel: Recent Labs  Lab 01/13/23 1718 01/14/23 0659 01/16/23 0801  NA 139 137 137  K 4.5 4.0 4.0  CL 106 104 99  CO2 24 24 26   GLUCOSE 156* 158* 133*  BUN 22 19 40*  CREATININE 0.68 0.73 0.91  CALCIUM 10.5* 10.2 11.0*  MG  --  2.1  --      Recent Results (from the past 240 hour(s))  SARS Coronavirus 2 by RT PCR (hospital order, performed in [facility] hospital lab) *cepheid single result test* Anterior Nasal Swab     Status: None   Collection Time: 01/13/23  5:18 PM   Specimen: Anterior Nasal Swab  Result Value Ref Range Status   SARS Coronavirus 2 by RT PCR NEGATIVE NEGATIVE Final    Comment: (NOTE) SARS-CoV-2 target nucleic acids are NOT DETECTED.  The SARS-CoV-2 RNA is generally detectable in upper and lower respiratory specimens during the acute phase of infection. The lowest concentration of SARS-CoV-2 viral copies this assay can detect is 250 copies / mL. A negative  result does not preclude SARS-CoV-2 infection and should not be used as the sole basis for treatment or other patient management decisions.  A negative result may occur with improper specimen collection / handling, submission of specimen other than nasopharyngeal swab, presence of viral mutation(s) within the areas targeted by this assay, and inadequate number of viral copies (<250 copies / mL). A negative result must be combined with clinical observations, patient history, and epidemiological information.  Fact Sheet for Patients:   RoadLapTop.co.za  Fact Sheet for Healthcare Providers: http://kim-miller.com/  This test is not yet approved or  cleared by the [place] FDA and has been authorized for detection and/or diagnosis of SARS-CoV-2 by FDA under an Emergency Use Authorization (EUA).  This EUA will remain in effect (meaning this test can be used) for the  duration of the COVID-19 declaration under Section 564(b)(1) of the Act, 21 U.S.C. section 360bbb-3(b)(1), unless the authorization is terminated or revoked sooner.  Performed at [facility], [address] [place], [place] [address]      Radiology Studies: DG CHEST PORT 1 VIEW  Result Date: 01/16/2023 CLINICAL DATA:  Cough EXAM: PORTABLE CHEST 1 VIEW COMPARISON:  01/13/2023 FINDINGS: Left-sided implanted cardiac device remains in place. Stable cardiomegaly. Aortic atherosclerosis. Streaky bibasilar interstitial opacities. Possible small left pleural effusion. No pneumothorax. Advanced degenerative changes of both shoulders. IMPRESSION: 1. Streaky bibasilar interstitial opacities, which may represent atelectasis or mild edema. 2. Possible small left pleural effusion. Electronically Signed   By: Duanne Guess D.O.   On: 01/16/2023 14:10    Scheduled Meds:  azelastine  2 spray Each Nare BID   budesonide (PULMICORT) nebulizer solution  0.5 mg Nebulization  BID   chlorpheniramine-HYDROcodone  5 mL Oral Q12H   cholecalciferol  2,000 Units Oral QHS   [START ON 02/03/2023] cyanocobalamin  1,000 mcg Intramuscular Q30 days   cyclobenzaprine  5 mg Oral QHS   docusate sodium  100 mg Oral BID   dofetilide  250 mcg Oral BID   febuxostat  40 mg Oral Daily   feeding supplement  237 mL Oral BID BM   gabapentin  200 mg Oral QHS   guaiFENesin  600 mg Oral BID   levalbuterol  1.25 mg Nebulization BID   losartan  25 mg Oral Daily   magnesium oxide  400 mg Oral QHS   multivitamin with minerals  1 tablet Oral Daily   polyethylene glycol  17 g Oral Daily   predniSONE  40 mg Oral Q breakfast   revefenacin  175 mcg Nebulization Daily   rivaroxaban  20 mg Oral Q supper   senna  1 tablet Oral BID   spironolactone  25 mg Oral Daily   torsemide  20 mg Oral Daily   Continuous Infusions:  cefTRIAXone (ROCEPHIN)  IV 1 g (01/17/23 0125)     LOS: 4 days   Burnadette Pop, MD [facility] P8/04/2023, 11:08 AM

## 2023-01-17 NOTE — Consult Note (Signed)
NAME:  Kathleen [name], MRN:  [id number], DOB:  1946/05/18, LOS: 4 ADMISSION DATE:  01/13/2023, CONSULTATION DATE:  01/16/23 REFERRING MD:  Burnadette Pop, MD CHIEF COMPLAINT:  COPD exacerbation   History of Present Illness:  77 year old female with COPD, asthma, chronic respiratory failure 3L at rest and 4L on exertion at home, NIV admitted for COPD exacerbation on 8/7. Failed outpatient therapy x 2 rounds (7/15 and 7/23) with increased O2 requirement to 5L at rest. Previously able to cook and perform ADLs but unable to stand without feeling breathless. Unable to wear BiPAP due to congestion x 2 weeks. Has had 5lb weight gain in 24 hours, abdominal swelling  but reports not being able to diurese well recently. Denies leg swelling. In the ED SpO2 90s on  5L. CXR with small left pleural effusion otherwise no infiltrate. Admitted to  [facility] for COPD exacerbation. Started on IV solumedrol, IV abx and nebulizers.  Since admission she reports feeling partially better however she developed cough and now again has worsening shortness of breath, fatigue and worsening cough. Given tussionex with some relief.  Pertinent  Medical History  COPD, asthma, chronic respiratory failure 3L at rest and 4L on exertion at home, NIV, atrial fibrillation, DM2, CKD IIIa, HLD, hypothyroidism, morbidity obese  Significant Hospital Events: Including procedures, antibiotic start and stop dates in addition to other pertinent events   8/7 Admit  Interim History / Subjective:  Remains on 3-5L O2 and remains fatigued and short of breath at rest. Has not been wearing bipap for weeks Unsure if she has any improvement on nebulized treatments vs her inhalers Reports normal UOP  Objective   Blood pressure 126/65, pulse 76, temperature 97.7 F (36.5 C), temperature source Oral, resp. rate 16, height 5' (1.524 m), weight 88 kg, SpO2 97%.       No intake or output data in the 24 hours ending 01/17/23 1338 Filed Weights   01/13/23  1550 01/16/23 0500  Weight: 88 kg 88 kg   Physical Exam: General: Chronically ill-appearing, no acute distress HENT: [place], AT, OP clear, MMM Eyes: EOMI, no scleral icterus Respiratory: Diminished but clear to auscultation bilaterally.  No crackles, wheezing or rales Cardiovascular: RRR, -M/R/G, no JVD GI: BS+, soft, nontender Extremities:-Edema,-tenderness Neuro: AAO x4, CNII-XII grossly intact Psych: Normal mood, normal affect   Resolved Hospital Problem list    Assessment & Plan:   COPD-asthma exacerbation (FEV1 48%) - may be approaching end stage COPD Acute on chronic respiratory failure on 3L at baseline At baseline O2 however symptomatic. Focus on symptom relief at this point. Start with nebulized regimen first. If this does not provide relief, switch back to inhalers and consider chronic opioid therapy for shortness of breath and cough  --Transitioned inhalers (Breo) to nebulizers on 8/10: Budesonide BID and Yupelri daily. PRN Xopenex --Wean supplemental O2 for goal >88% --Agree with CAP coverage --PRN cough syrup  Pulmonary will continue to follow  Best Practice (right click and "Reselect all SmartList Selections" daily)  Per Primary:  Diet/type: Regular consistency (see orders) DVT prophylaxis: DOAC GI prophylaxis: N/A Lines: N/A Foley:  N/A Code Status:  full code Last date of multidisciplinary goals of care discussion [ per primary]   Critical care time: N/A    Care Time: 35 min  Mechele Collin, M.D. [facility] Pulmonary/Critical Care Medicine 01/17/2023 1:38 PM   See Amion for personal pager For hours between 7 PM to 7 AM, please call Elink for urgent questions

## 2023-01-17 NOTE — Progress Notes (Signed)
TRH night cross cover note:   I was notified by RN that the patient is complaining of constipation, noting most recent bowel movement occurred 4 days ago.  She is currently on prn Linzess as well as scheduled Colace.  I subsequently added prn MiraLAX to this regimen, with first dose now.    Newton Pigg, DO Hospitalist

## 2023-01-17 NOTE — Progress Notes (Signed)
RT spoke to patient about wearing BiPAP and patient stated that she will sleep with the nasal cannula on for tonight with her persistent cough.  RT will continue to monitor.

## 2023-01-18 ENCOUNTER — Telehealth: Payer: Self-pay | Admitting: Internal Medicine

## 2023-01-18 DIAGNOSIS — J441 Chronic obstructive pulmonary disease with (acute) exacerbation: Secondary | ICD-10-CM | POA: Diagnosis not present

## 2023-01-18 MED ORDER — MONTELUKAST SODIUM 10 MG PO TABS
10.0000 mg | ORAL_TABLET | Freq: Every day | ORAL | Status: DC
  Administered 2023-01-18: 10 mg via ORAL
  Filled 2023-01-18: qty 1

## 2023-01-18 MED ORDER — PANTOPRAZOLE SODIUM 40 MG PO TBEC
40.0000 mg | DELAYED_RELEASE_TABLET | Freq: Every day | ORAL | Status: DC
  Administered 2023-01-18: 40 mg via ORAL
  Filled 2023-01-18: qty 1

## 2023-01-18 MED ORDER — PREDNISONE 20 MG PO TABS
20.0000 mg | ORAL_TABLET | Freq: Every day | ORAL | Status: DC
  Administered 2023-01-19: 20 mg via ORAL
  Filled 2023-01-18: qty 1

## 2023-01-18 MED ORDER — GUAIFENESIN-DM 100-10 MG/5ML PO SYRP
10.0000 mL | ORAL_SOLUTION | Freq: Four times a day (QID) | ORAL | Status: DC
  Administered 2023-01-18: 10 mL via ORAL
  Filled 2023-01-18: qty 10

## 2023-01-18 MED ORDER — GUAIFENESIN-DM 100-10 MG/5ML PO SYRP
10.0000 mL | ORAL_SOLUTION | Freq: Every evening | ORAL | Status: DC | PRN
  Administered 2023-01-18: 10 mL via ORAL
  Filled 2023-01-18: qty 10

## 2023-01-18 NOTE — Progress Notes (Signed)
PROGRESS NOTE  Kathleen [name]  XBJ:[id number] DOB: 1946/04/24 DOA: 01/13/2023 PCP: [name], MD   Brief Narrative: Patient is a 77 year old female with history of COPD, systolic dysfunction with EF of 35%, chronic hypoxic respiratory failure on 3 to 4 L of oxygen at home who presented from home with shortness of breath.  She was requiring up to 5 L at home, was advised by her pulmonologist to go to the ER.  On presentation ,she was saturating at 90s on 5 L.  Chest x-ray showed stable cardiomegaly, bibasilar atelectasis, small left pleural effusion, no infiltrate.  Patient was admitted for the management of acute COPD exacerbation.  Patient has been weaned to her baseline oxygen requirement but she continued to be bothered with severe cough.  Pulmonology consulted and following.  Overall status improving, plan for discharge tomorrow to independent living facility  Assessment & Plan:  Principal Problem:   Acute exacerbation of chronic obstructive pulmonary disease (COPD) (HCC) Active Problems:   Chronic respiratory failure with hypoxia (HCC)   Chronic systolic heart failure (HCC)   HTN (hypertension)   Atrial fibrillation (HCC)   Sleep apnea   Type 2 diabetes mellitus with diabetic neuropathy, without long-term current use of insulin (HCC)   Stage 3a chronic kidney disease (HCC)   Hyperlipidemia with target LDL less than 100   Acquired hypothyroidism   Morbid obesity (HCC)   Acute on chronic hypoxic respiratory failure: On 3 to 4 L of oxygen at home.  Secondary to COPD exacerbation.  Continue steroids, bronchodilators.  Now back to 4 L of oxygen.  Acute COPD exacerbation: Continue steroids, bronchodilators.  Also started antibiotic: ceftriaxone.   No significant wheezing.  Steroids changed to oral.    Bothered with significant hacking cough.  Pulmonology consulted  and following.  Continue Yupelri, budesonide,xopenex.  As per pulmonology, we started on nightly BiPAP.  She uses BiPAP at  night at home  Chronic systolic CHF: Currently appears compensated.  Last EF of 30 to 35% as per echo in April.  On torsemide, Aldactone, losartan at home.  No crackles auscultated,  does not have significant leg edema.  Given  a dose of Lasix 40 mg IV on 8/9.  Restarted home torsemide.  Hypertension: Blood pressure stable.  Continue current medications  Paroxysmal A-fib: Monitor on telemetry.  On Xarelto for anticoagulation.  Also on Tikosyn.  Currently rate is well controlled. Last  QTc of 446  Type 2 diabetes: Continue sliding scale.  Monitor blood sugars  Hyperlipidemia: Intolerant to statin.  Acquired hypothyroidism: On  levothyroxine  Morbid obesity: BMI of 37.9  CKD stage IIIa: Currently kidney function at baseline   Nutrition Problem: Increased nutrient needs Etiology: chronic illness (COPD)    DVT prophylaxis:rivaroxaban (XARELTO) tablet 20 mg Start: 01/14/23 1700 rivaroxaban (XARELTO) tablet 20 mg     Code Status: Full Code  Family Communication: Called and discussed with son [name] on phone on 8/8  Patient status:Inpatient  [facility] is from :[facility]  Anticipated discharge to:[facility]  Estimated DC date: tomorrow   Consultants: Pulmonology  Procedures:None  Antimicrobials:  Anti-infectives (From admission, onward)    Start     Dose/Rate Route Frequency Ordered Stop   01/14/23 0030  cefTRIAXone (ROCEPHIN) 1 g in sodium chloride 0.9 % 100 mL IVPB        1 g 200 mL/hr over 30 Minutes Intravenous Every 24 hours 01/13/23 2339 01/18/23 0050   01/14/23 0030  azithromycin (ZITHROMAX) 500 mg in sodium chloride 0.9 %  250 mL IVPB  Status:  Discontinued        500 mg 250 mL/hr over 60 Minutes Intravenous Every 24 hours 01/13/23 2339 01/14/23 0116       Subjective: Patient seen and examined at bedside today.  Hemodynamically stable.  Looks more comfortable today than yesterday.  Still having some cough but no wheezing.  On 4 L of oxygen which is her baseline.  She states  she did not use BiPAP yesterday.  Discussed about the use of BiPAP at night.  She wants to dc the tussionex    Objective: Vitals:   01/18/23 0349 01/18/23 0500 01/18/23 0803 01/18/23 0937  BP: 118/61  116/65   Pulse: 70  70   Resp: 16  16   Temp: 97.9 F (36.6 C)  97.9 F (36.6 C)   TempSrc: Oral  Oral   SpO2: 100%  99% 97%  Weight:  88 kg    Height:       No intake or output data in the 24 hours ending 01/18/23 1029  Filed Weights   01/13/23 1550 01/16/23 0500 01/18/23 0500  Weight: 88 kg 88 kg 88 kg    Examination:  General exam: Overall comfortable, not in distress HEENT: PERRL Respiratory system:  no frank  wheezes or crackles  Cardiovascular system: S1 & S2 heard, RRR.  Gastrointestinal system: Abdomen is nondistended, soft and nontender. Central nervous system: Alert and oriented Extremities: No edema, no clubbing ,no cyanosis Skin: No rashes, no ulcers,no icterus     Data Reviewed: I have personally reviewed following labs and imaging studies  CBC: Recent Labs  Lab 01/13/23 1718 01/14/23 0659  WBC 11.3* 11.3*  NEUTROABS 10.1*  --   HGB 11.5* 10.4*  HCT 34.8* 32.6*  MCV 85.3 87.6  PLT 241 254   Basic Metabolic Panel: Recent Labs  Lab 01/13/23 1718 01/14/23 0659 01/16/23 0801  NA 139 137 137  K 4.5 4.0 4.0  CL 106 104 99  CO2 24 24 26   GLUCOSE 156* 158* 133*  BUN 22 19 40*  CREATININE 0.68 0.73 0.91  CALCIUM 10.5* 10.2 11.0*  MG  --  2.1  --      Recent Results (from the past 240 hour(s))  SARS Coronavirus 2 by RT PCR (hospital order, performed in [facility] hospital lab) *cepheid single result test* Anterior Nasal Swab     Status: None   Collection Time: 01/13/23  5:18 PM   Specimen: Anterior Nasal Swab  Result Value Ref Range Status   SARS Coronavirus 2 by RT PCR NEGATIVE NEGATIVE Final    Comment: (NOTE) SARS-CoV-2 target nucleic acids are NOT DETECTED.  The SARS-CoV-2 RNA is generally detectable in upper and lower respiratory  specimens during the acute phase of infection. The lowest concentration of SARS-CoV-2 viral copies this assay can detect is 250 copies / mL. A negative result does not preclude SARS-CoV-2 infection and should not be used as the sole basis for treatment or other patient management decisions.  A negative result may occur with improper specimen collection / handling, submission of specimen other than nasopharyngeal swab, presence of viral mutation(s) within the areas targeted by this assay, and inadequate number of viral copies (<250 copies / mL). A negative result must be combined with clinical observations, patient history, and epidemiological information.  Fact Sheet for Patients:   RoadLapTop.co.za  Fact Sheet for Healthcare Providers: http://kim-miller.com/  This test is not yet approved or  cleared by the [place] FDA  and has been authorized for detection and/or diagnosis of SARS-CoV-2 by FDA under an Emergency Use Authorization (EUA).  This EUA will remain in effect (meaning this test can be used) for the duration of the COVID-19 declaration under Section 564(b)(1) of the Act, 21 U.S.C. section 360bbb-3(b)(1), unless the authorization is terminated or revoked sooner.  Performed at [facility], [address], [place], [place] [address]      Radiology Studies: No results found.  Scheduled Meds:  azelastine  2 spray Each Nare BID   budesonide (PULMICORT) nebulizer solution  0.5 mg Nebulization BID   cholecalciferol  2,000 Units Oral QHS   [START ON 02/03/2023] cyanocobalamin  1,000 mcg Intramuscular Q30 days   cyclobenzaprine  5 mg Oral QHS   docusate sodium  100 mg Oral BID   dofetilide  250 mcg Oral BID   febuxostat  40 mg Oral Daily   feeding supplement  237 mL Oral BID BM   gabapentin  200 mg Oral QHS   guaiFENesin  600 mg Oral BID   guaiFENesin-dextromethorphan  10 mL Oral QID   levalbuterol   1.25 mg Nebulization BID   losartan  25 mg Oral Daily   magnesium oxide  400 mg Oral QHS   multivitamin with minerals  1 tablet Oral Daily   polyethylene glycol  17 g Oral Daily   revefenacin  175 mcg Nebulization Daily   rivaroxaban  20 mg Oral Q supper   senna  1 tablet Oral BID   spironolactone  25 mg Oral Daily   torsemide  20 mg Oral Daily   Continuous Infusions:     LOS: 5 days   Burnadette Pop, MD [facility] P8/05/2023, 10:29 AM

## 2023-01-18 NOTE — Progress Notes (Signed)
   NAME:  Kathleen [name], MRN:  [id number], DOB:  1946-02-11, LOS: 5 ADMISSION DATE:  01/13/2023, CONSULTATION DATE:  01/16/23 REFERRING MD:  Burnadette Pop, MD CHIEF COMPLAINT:  COPD exacerbation   History of Present Illness:  77 year old female with COPD, asthma, chronic respiratory failure 3L at rest and 4L on exertion at home, NIV admitted for COPD exacerbation on 8/7. Failed outpatient therapy x 2 rounds (7/15 and 7/23) with increased O2 requirement to 5L at rest. Previously able to cook and perform ADLs but unable to stand without feeling breathless. Unable to wear BiPAP due to congestion x 2 weeks. Has had 5lb weight gain in 24 hours, abdominal swelling  but reports not being able to diurese well recently. Denies leg swelling. In the ED SpO2 90s on  5L. CXR with small left pleural effusion otherwise no infiltrate. Admitted to [facility] for COPD exacerbation. Started on IV solumedrol, IV abx and nebulizers.  Since admission she reports feeling partially better however she developed cough and now again has worsening shortness of breath, fatigue and worsening cough. Given tussionex with some relief.  Pertinent  Medical History  COPD, asthma, chronic respiratory failure 3L at rest and 4L on exertion at home, NIV, atrial fibrillation, DM2, CKD IIIa, HLD, hypothyroidism, morbidity obese  Significant Hospital Events: Including procedures, antibiotic start and stop dates in addition to other pertinent events   8/7 Admit  Interim History / Subjective:  Slowly improving with breathing but still has nagging barking cough worse at night.  Objective   Blood pressure 116/65, pulse 70, temperature 97.9 F (36.6 C), temperature source Oral, resp. rate 16, height 5' (1.524 m), weight 88 kg, SpO2 97%.       No intake or output data in the 24 hours ending 01/18/23 1151 Filed Weights   01/13/23 1550 01/16/23 0500 01/18/23 0500  Weight: 88 kg 88 kg 88 kg   Physical Exam: No distress sitting in bed Lungs with  some focal rhonci on R Ext warm No edema Moves to command  Labs/imaging reviewed   Resolved Hospital Problem list    Assessment & Plan:   COPD-asthma exacerbation (FEV1 48%) - may be approaching end stage COPD Acute on chronic respiratory failure on 3L at baseline  Overall feeling better, cough seems to be major lingering issue, will add trial of singulair and PPI given possible asthma overlap.  Add pred x 5 more days 20mg   Cough syrup at bedtime per patient request  Will arrange 2-3 week f/u appt at office, available PRN  Caryl Bis Pulmonary/Critical Care Medicine 01/18/2023 11:51 AM   See Amion for personal pager For hours between 7 PM to 7 AM, please call Elink for urgent questions

## 2023-01-18 NOTE — Progress Notes (Signed)
Physical Therapy Treatment Patient Details Name: Kathleen [name] MRN: [id number] DOB: 1946/04/24 Today's Date: 01/18/2023   History of Present Illness Pt is a 77 y.o. female presenting from pulmonologist via POV with SOB on 5L O2 (3L baseline). Admitted for acute exacerbation of COPD. PMH significant for COPD, systolic dysfunction CHF EF of 35% back in April, chronic respiratory failure on 3 to 4 L of oxygen at home    PT Comments  Pt greeted seated up EOB and agreeable to session with continued progress towards acute goals. Session focused on gait with rollator support for increased activity tolerance and improved cardiopulmonary endurance. Pt requiring grossly CGA down to supervision for transfers and gait with rollator support with some noted SOB during activity with increased conversation. Educated pt on appropriate activity progression and importance of continued mobility post-acutely with pt verbalizing understanding. Pt continues to benefit from skilled PT services to progress toward functional mobility goals.     If plan is discharge home, recommend the following: Assistance with cooking/housework;Assist for transportation;Help with stairs or ramp for entrance   Can travel by private vehicle        Equipment Recommendations  None recommended by PT    Recommendations for Other Services       Precautions / Restrictions Precautions Precautions: Fall Restrictions Weight Bearing Restrictions: No     Mobility  Bed Mobility Overal bed mobility: Modified Independent             General bed mobility comments: slightly increased time and effort    Transfers Overall transfer level: Needs assistance Equipment used: None, Rollator (4 wheels) Transfers: Sit to/from Stand Sit to Stand: Contact guard assist           General transfer comment: CGA for safety, pt able to manage rollator once standing.    Ambulation/Gait Ambulation/Gait assistance: Contact guard assist,  Supervision Gait Distance (Feet): 265 Feet Assistive device: Rollator (4 wheels) Gait Pattern/deviations: Step-through pattern, Decreased stride length, Decreased dorsiflexion - left, Decreased dorsiflexion - right, Wide base of support, Trunk flexed Gait velocity: decr     General Gait Details: overall pt steady with safe management of rollator without cues. pt slightly more SOB at end of gait, on 4L/min throughout, educated pt on intermittent need for less tlaking while ambulating for decreased SOB   Stairs             Wheelchair Mobility     Tilt Bed    Modified Rankin (Stroke Patients Only)       Balance Overall balance assessment: Needs assistance Sitting-balance support: No upper extremity supported, Feet supported Sitting balance-Leahy Scale: Fair Sitting balance - Comments: able to perform figure 4   Standing balance support: During functional activity, No upper extremity supported Standing balance-Leahy Scale: Fair Standing balance comment: able to demonstrate short gait in room without UE support                            Cognition Arousal: Alert Behavior During Therapy: WFL for tasks assessed/performed Overall Cognitive Status: Within Functional Limits for tasks assessed                                 General Comments: Slightly hyperverbose, but very pleasant and conversational. Able to provide thorough report of events leading to hospitalization. Intermittently needing redirection to stay on task  Exercises Other Exercises Other Exercises: educated pt on appropriate activity progression post acutely    General Comments General comments (skin integrity, edema, etc.): HR in 80s throughout gait, pt without dizziness'lightheadedness      Pertinent Vitals/Pain Pain Assessment Pain Assessment: Faces Faces Pain Scale: Hurts a little bit Pain Location: L foot. Pt reports broken toe? Pain Descriptors / Indicators:  Discomfort Pain Intervention(s): Monitored during session, Limited activity within patient's tolerance    Home Living                          Prior Function            PT Goals (current goals can now be found in the care plan section) Acute Rehab PT Goals Patient Stated Goal: improve breathing and get back home PT Goal Formulation: With patient Time For Goal Achievement: 01/28/23 Progress towards PT goals: Progressing toward goals    Frequency    Min 1X/week      PT Plan      Co-evaluation              AM-PAC PT "6 Clicks" Mobility   Outcome Measure  Help needed turning from your back to your side while in a flat bed without using bedrails?: A Little Help needed moving from lying on your back to sitting on the side of a flat bed without using bedrails?: A Little Help needed moving to and from a bed to a chair (including a wheelchair)?: A Little Help needed standing up from a chair using your arms (e.g., wheelchair or bedside chair)?: A Little Help needed to walk in hospital room?: A Little Help needed climbing 3-5 steps with a railing? : A Little 6 Click Score: 18    End of Session Equipment Utilized During Treatment: Oxygen Activity Tolerance: Patient tolerated treatment well Patient left: with call bell/phone within reach;in bed;Other (comment) (seated up EOB) Nurse Communication: Mobility status PT Visit Diagnosis: Unsteadiness on feet (R26.81);Muscle weakness (generalized) (M62.81);Difficulty in walking, not elsewhere classified (R26.2);Other abnormalities of gait and mobility (R26.89)     Time: 1610-9604 PT Time Calculation (min) (ACUTE ONLY): 27 min  Charges:    $Gait Training: 8-22 mins $Therapeutic Activity: 8-22 mins PT General Charges $$ ACUTE PT VISIT: 1 Visit                      R. PTA Acute Rehabilitation Services Office: [phone]   Catalina Antigua 01/18/2023, 11:04 AM

## 2023-01-18 NOTE — Plan of Care (Signed)

## 2023-01-19 DIAGNOSIS — J441 Chronic obstructive pulmonary disease with (acute) exacerbation: Secondary | ICD-10-CM | POA: Diagnosis not present

## 2023-01-19 MED ORDER — PREDNISONE 20 MG PO TABS: 20.0000 mg | ORAL_TABLET | Freq: Every day | ORAL | 0 refills | Status: DC

## 2023-01-19 MED ORDER — MONTELUKAST SODIUM 10 MG PO TABS: 10.0000 mg | ORAL_TABLET | Freq: Every day | ORAL | 1 refills | Status: DC

## 2023-01-19 MED ORDER — BUDESONIDE 0.5 MG/2ML IN SUSP: 0.5000 mg | Freq: Two times a day (BID) | RESPIRATORY_TRACT | 1 refills | Status: DC

## 2023-01-19 MED ORDER — LEVALBUTEROL HCL 0.63 MG/3ML IN NEBU: 0.6300 mg | INHALATION_SOLUTION | Freq: Four times a day (QID) | RESPIRATORY_TRACT | 12 refills | Status: DC | PRN

## 2023-01-19 MED ORDER — PANTOPRAZOLE SODIUM 40 MG PO TBEC: 40.0000 mg | DELAYED_RELEASE_TABLET | Freq: Every day | ORAL | 1 refills | Status: DC

## 2023-01-19 MED ORDER — HYDROCOD POLI-CHLORPHE POLI ER 10-8 MG/5ML PO SUER: 5.0000 mL | Freq: Every evening | ORAL | 0 refills | Status: DC | PRN

## 2023-01-19 MED ORDER — GUAIFENESIN 400 MG PO TABS: 400.0000 mg | ORAL_TABLET | ORAL | 0 refills | Status: DC | PRN

## 2023-01-19 NOTE — Progress Notes (Signed)
Office Visit Note  Patient: Kathleen [name]             Date of Birth: 10/24/1945           MRN: [id number]             PCP: [name], MD Referring: [name], MD Visit Date: 01/27/2023   Subjective:  Follow-up (Patient states she recently got out of the hospital and had a COPD flare. Patient states she is on a couple new medications. Patient states she was in the hospital for seven days. Patient states she was told she has arthritis in her back and both shoulders. )   History of Present Illness: Kathleen [name] is a 77 y.o. 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  Gout is doing well overall with no new definite flareup since her last visit has been only 1 in the past year.  Unfortunately has had ongoing joint pain at multiple areas.  Especially in her neck and back and shoulders.  Seeing Dr. Shon Baton for this had steroid injection at the right subacromial joint which was beneficial.  Also started formal physical therapy for deficits due to rotator cuff tear and has improved her extension and abduction range of motion to slightly better than horizontal.  Has lot of trigger point and muscle knot problems in the upper back these are sometimes worse after doing shoulder exercises. Recent medical events with exacerbation of COPD leading to a hospitalization earlier this month.  Before that she was sick with worsening respiratory symptoms from a pneumonia that did not resolve for several weeks.  X-ray from July 15 did not show evidence of pneumonia.  X-ray at hospital admission on August 7 also with clear airspace but with new pleural effusion.  She was not seeing any visible increase in lower extremity swelling.  Lab testing on discharge showed decrease in total protein level. Also has ongoing trouble with intermittent numbness and dropping items unintentionally more in the right hand.  Reports previous history of carpal tunnel syndrome but reasonably  well-controlled based on previous orthopedics note.  Never had any local intervention on this. Also has pain in her left foot currently after she struck her third toe on a counter corner walking at home.  There was extensive bruising and swelling immediately afterwards but this has mostly improved after 4 weeks.  She initially treated with buddy taping the second and third toe together. Since then just wearing some shoes with a looser toe box.  Previous HPI 07/23/2022 Kathleen [name] is a 77 y.o. 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  She has not had any new flareups since her last visit.  Recently had some additional adjustments to her diuretic medication due to increase in volume overload and oxygen requirement.  For about the past 3 weeks is also having increase in left shoulder pain with decreased mobility.  This is limiting her quite a bit since the right shoulder has little use due to full-thickness tendon tears for which she is not a surgical candidate to repair due to severe cardiopulmonary disease.  Less severe is having pain in bilateral hips this frequently bothers her lying in bed at night or if she is seated in fixed position for more than about half an hour.   Previous HPI 01/19/22 Kathleen [name] is a 77 y.o. female here for evaluation and management of chronic gout.  She has had gouty arthritis ongoing for a few years now most often affecting the right foot.  She saw a rheumatology provider in [place] for management more recently just being treated with her primary care office.  She has previously taken allopurinol stopped due to concern about allergic reaction.  She was on maintenance colchicine for flare prevention but had stopped this due to lack of efficacy.  More recently was prescribed colchicine during severe flareup of pain in her left hand with no obvious clinical response.  She was treated with probenecid apparently stopped due to poor clinical  response, not sure if this was uric acid or other parameters.  More recently appears to have been treated with uloric since 2020 but with increased frequency and duration of gout flares in the past year. She was admitted to the hospital in December apparently with sepsis secondary to some chest wall cellulitis.  During his hospital stay she developed right and then the left-sided foot swelling and severe pain.  She also had a recurrence of the right foot symptoms within a few days after hospital discharge.  This was treated with prednisone with clinical improvement.  Symptoms remained okay with her feet but in June developed sudden left fourth PIP joint pain swelling and erythema.  This was thought to represent a new gout flare and treated with oral colchicine.  Symptoms are ongoing for about a month before resolving on their own she did not see any difference when taking the medicine.  She has been taking her subluxes at 40 mg daily consistently throughout this time.  Right now joints are pretty much all back to baseline.   Review of Systems  Constitutional:  Positive for fatigue.  HENT:  Positive for mouth dryness. Negative for mouth sores.   Eyes:  Negative for dryness.  Respiratory:  Positive for shortness of breath.   Cardiovascular:  Positive for chest pain. Negative for palpitations.  Gastrointestinal:  Positive for constipation. Negative for blood in stool and diarrhea.  Endocrine: Positive for increased urination.  Genitourinary:  Negative for involuntary urination.  Musculoskeletal:  Positive for gait problem. Negative for joint pain, joint pain, joint swelling, myalgias, muscle weakness, morning stiffness, muscle tenderness and myalgias.  Skin:  Positive for hair loss. Negative for color change, rash and sensitivity to sunlight.  Allergic/Immunologic: Positive for susceptible to infections.  Neurological:  Positive for dizziness and headaches.  Hematological:  Negative for swollen glands.   Psychiatric/Behavioral:  Positive for sleep disturbance. Negative for depressed mood. The patient is nervous/anxious.     PMFS History:  Patient Active Problem List   Diagnosis Date Noted   Carpal tunnel syndrome, right upper limb 01/27/2023   Acute exacerbation of chronic obstructive pulmonary disease (COPD) (HCC) 01/13/2023   Dysuria 11/26/2022   Hypercoagulable state due to persistent atrial fibrillation (HCC) 11/18/2022   Acute on chronic heart failure with preserved ejection fraction (HCC) 10/26/2022   COPD with acute exacerbation (HCC) 10/09/2022   Pain in left shoulder 07/23/2022   Chronic obstructive pulmonary disease (HCC) 06/28/2022   Palliative care encounter 06/28/2022   Post-viral cough syndrome 06/02/2022   Paranasal sinus disease 05/19/2022   Heart failure with reduced ejection fraction (HCC) 05/14/2022   Pneumonia 04/29/2022   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   Depression, major, single episode, moderate (HCC) 04/27/2022   Non-STEMI (non-ST elevated myocardial infarction) (HCC) 03/18/2022   Morbid obesity (HCC) 03/18/2022   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   Low TSH level 12/02/2021   Acute idiopathic gout of  left hand 11/19/2021   Acquired hypothyroidism 11/12/2021   Encounter for palliative care involving management of pain 11/12/2021   Post concussive syndrome 11/11/2021   Chronic left-sided low back pain 11/04/2021   Gout 10/01/2021   Hyperlipidemia with target LDL less than 100 08/07/2021   Chronic anticoagulation 07/07/2021   Stage 3a chronic kidney disease (HCC) 05/23/2021   Dietary iron deficiency 05/23/2021   Age-related osteoporosis without current pathological fracture 04/22/2021   Type 2 diabetes mellitus with diabetic neuropathy, without long-term current use of insulin (HCC) 04/22/2021   Sleep apnea 01/22/2021   Statin intolerance 10/12/2019   Constipation    Statin myopathy 02/24/2019   Chronic systolic heart failure (HCC) 04/06/2018   HTN  (hypertension) 04/06/2018   Atrial fibrillation (HCC) 04/06/2018   Chronic respiratory failure with hypoxia (HCC) 03/28/2018   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 12/02/2015   Malnutrition of moderate degree 09/11/2015    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    Concussion 10/04/2021   COPD (chronic obstructive pulmonary disease) (HCC)    Gout    Hypertension    Longstanding persistent atrial fibrillation (HCC)    on Xarelto   Nonischemic cardiomyopathy (HCC)    Obesity    Pacemaker    Spontaneous pneumothorax 2013    Family History  Problem Relation Age of Onset   Breast cancer Mother 15       3 different times   Pancreatic cancer Mother    Emphysema Father    Healthy Brother    Breast cancer Cousin    Valvular heart disease Son    Obesity Daughter    Colon cancer Neg Hx    Esophageal cancer Neg Hx    Stomach cancer Neg Hx    Inflammatory bowel disease Neg Hx    Liver disease Neg Hx    Rectal cancer Neg Hx    Past Surgical History:  Procedure Laterality Date   ABDOMINAL HYSTERECTOMY     APPENDECTOMY     ATRIAL FIBRILLATION ABLATION  07/20/2013   by Dr Christin Fudge   AV nodal ablation  11/01/2013   by Dr Christin Fudge, repeated by Dr Wilford Grist   BREAST BIOPSY Bilateral 1997   negative   CARDIAC CATHETERIZATION     CHOLECYSTECTOMY     HERNIA REPAIR     PACEMAKER INSERTION  06/2017   MDT Viva CRT-P implanted by Dr Christin Fudge after AV nodal ablation,  LV lead could not be placed   PACEMAKER INSERTION  05/2020   with lead bundle   RIGHT/LEFT HEART CATH AND CORONARY ANGIOGRAPHY N/A 03/20/2022   Procedure: RIGHT/LEFT HEART CATH AND CORONARY ANGIOGRAPHY;  Surgeon: [place], Peter M, MD;  Location: [facility] INVASIVE CV LAB;  Service: Cardiovascular;  Laterality: N/A;    Social History   Social History Narrative   Pt lives in [place] alone.  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  Her son works in [employer].   Immunization History  Administered Date(s) Administered   Fluad Quad(high Dose 65+) 04/22/2021, 03/16/2022   Hepatitis A, Adult 04/12/2001   Influenza, High Dose Seasonal PF 03/16/2019   PFIZER Comirnaty(Gray Top)Covid-19 Tri-Sucrose Vaccine 03/29/2020, 10/09/2020   PFIZER(Purple Top)SARS-COV-2 Vaccination 07/13/2019, 08/08/2019   Pneumococcal Conjugate-13 08/30/2014   Pneumococcal Polysaccharide-23 03/31/2019  Tdap 10/02/2013   Yellow Fever 04/12/2001     Objective: Vital Signs: BP 112/67 (BP Location: Left Arm, Patient Position: Sitting, Cuff Size: Normal)   Pulse 73   Resp 16   Ht 5' (1.524 m)   Wt 192 lb (87.1 kg)   BMI 37.50 kg/m    Physical Exam Constitutional:      Appearance: She is obese.  Eyes:     Conjunctiva/sclera: Conjunctivae normal.  Cardiovascular:     Rate and Rhythm: Normal rate and regular rhythm.  Pulmonary:     Comments: On supplemental oxygen 4 L/min by nasal cannula Musculoskeletal:     Right lower leg: Edema present.     Left lower leg: Edema present.  Skin:    General: Skin is warm and dry.     Findings: Bruising present.     Comments: Left third toe  Neurological:     Mental Status: She is alert.  Psychiatric:        Mood and Affect: Mood normal.     Musculoskeletal Exam:  Right shoulder abduction and extension limited reach is just slightly above horizontal level very tender to pressure above the AC joint, left shoulder range of motion is better though has some tenderness to pressure more posterior Knees full range of motion no focal tenderness or palpable swelling there is bilateral patellofemoral crepitus Left third toe faint bruising present extending to above the MTP joint, very tender to pressure but MTP and IP joints range of motion is good  Investigation: No additional  findings.  Imaging: DG CHEST PORT 1 VIEW  Result Date: 01/16/2023 CLINICAL DATA:  Cough EXAM: PORTABLE CHEST 1 VIEW COMPARISON:  01/13/2023 FINDINGS: Left-sided implanted cardiac device remains in place. Stable cardiomegaly. Aortic atherosclerosis. Streaky bibasilar interstitial opacities. Possible small left pleural effusion. No pneumothorax. Advanced degenerative changes of both shoulders. IMPRESSION: 1. Streaky bibasilar interstitial opacities, which may represent atelectasis or mild edema. 2. Possible small left pleural effusion. Electronically Signed   By: Duanne Guess D.O.   On: 01/16/2023 14:10   DG Chest Port 1 View  Result Date: 01/13/2023 CLINICAL DATA:  Shortness of breath. EXAM: PORTABLE CHEST 1 VIEW COMPARISON:  December 21, 2022 FINDINGS: There is stable multi lead AICD positioning. The cardiac silhouette is mildly enlarged and unchanged in size. Mild, diffuse, chronic appearing increased interstitial lung markings are seen. Mild atelectasis is noted within the bilateral lung bases. A small left pleural effusion is seen. No pneumothorax is identified. Degenerative changes seen involving both shoulders and throughout the thoracic spine. IMPRESSION: 1. Stable cardiomegaly with mild bibasilar atelectasis. 2. Small left pleural effusion. Electronically Signed   By: Aram Candela M.D.   On: 01/13/2023 17:27   CUP PACEART REMOTE DEVICE CHECK  Result Date: 12/31/2022 Scheduled remote reviewed. Normal device function.  Next remote 91 days. LA, CVRS   Recent Labs: Lab Results  Component Value Date   WBC 11.3 (H) 01/14/2023   HGB 10.4 (L) 01/14/2023   PLT 254 01/14/2023   NA 137 01/16/2023   K 4.0 01/16/2023   CL 99 01/16/2023   CO2 26 01/16/2023   GLUCOSE 133 (H) 01/16/2023   BUN 40 (H) 01/16/2023   CREATININE 0.91 01/16/2023   BILITOT 0.2 (L) 01/14/2023   ALKPHOS 29 (L) 01/14/2023   AST 11 (L) 01/14/2023   ALT 10 01/14/2023   PROT 5.8 (L) 01/14/2023   ALBUMIN 3.1 (L)  01/14/2023   CALCIUM 11.0 (H) 01/16/2023   GFRAA 58 (L) 06/28/2019  Speciality Comments: No specialty comments available.  Procedures:  No procedures performed Allergies: Allopurinol, Clindamycin, Flublok [influenza vaccine recombinant], Pneumococcal 13-val conj vacc, Dronedarone, Brovana [arformoterol], Budesonide, Entresto [sacubitril-valsartan], Fosamax [alendronate sodium], Jardiance [empagliflozin], Meperidine, Microplegia msa-msg [plegisol], Rosuvastatin, Tetracycline, Adhesive [tape], and Lovastatin   Assessment / Plan:     Visit Diagnoses: Acute idiopathic gout of left hand  CKD stage III- 01/19/2022 Uric Acid 5.4 - Plan: COMPLETE METABOLIC PANEL WITH GFR, Uric acid  Gout appears pretty well-controlled only 1 definite flareup in the past year.  Recommend rechecking serum uric acid level for monitoring with several medical events and some change in labs on last complete metabolic panel but this was at hospitalization.  Checking uric acid and CMP.  If similar from 1 year ago continue on Uloric 40 mg daily.  Chronic bilateral shoulder pain  Status post injections into both subacromial joints earlier this year last with Dr. Shon Baton currently making good progress with physical therapy on her right shoulder mobility.  Not a good candidate for surgery due to COPD and congestive heart failure.  Some myofascial pain with tender points in the upper back appear to be in compensation for decreased shoulder mobility.  Lateral pain of hip - flexeril 5 mg at night as needed  Carpal tunnel syndrome, right upper limb  Previous history of carpal tunnel syndrome has not had any specific testing for this in years of the symptoms do not seem very severe.  Is reporting some increased trouble with frequency of numbness in her hand and is unintentionally dropping items.  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 Think she would benefit to see Dr. Alvester Morin for new nerve conduction study and might also be a candidate for nonsurgical intervention if there is spinal involvement.  Orders: Orders Placed This Encounter  Procedures   COMPLETE METABOLIC PANEL WITH GFR   Uric acid   No orders of the defined types were placed in this encounter.    Follow-Up Instructions: Return in about 6 months (around 07/30/2023) for Gout on uloric/?CTS f/u 6mos.   Fuller Plan, MD  Note - This record has been created using [employer].  Chart creation errors have been sought, but may not always  have been located. Such creation errors do not reflect on  the standard of medical care.

## 2023-01-19 NOTE — TOC Transition Note (Signed)
Transition of Care [facility]) - CM/SW Discharge Note   Patient Details  Name: Kathleen [name] MRN: [id number] Date of Birth: 1946-05-15  [facility] CM/SW Contact:  Janae Bridgeman, RN Phone Number: 01/19/2023, 11:59 AM   Clinical Narrative:    CM met with the patient at the bedside and patient is waiting on family transportation home by car.  I sent a message to [facility] and made them aware that patient is discharging back home today and will need services started.   Final next level of care: Home w Home Health Services Barriers to Discharge: Continued Medical Work up   Patient Goals and CMS Choice CMS Medicare.gov Compare Post Acute Care list provided to:: Patient Choice offered to / list presented to : Patient  Discharge Placement                         Discharge Plan and Services Additional resources added to the After Visit Summary for     Discharge Planning Services: CM Consult Post Acute Care Choice: Home Health                    HH Arranged: RN [facility] Agency: [facility] Date [facility] Agency Contacted: 01/14/23 Time HH Agency Contacted: 1115 Representative spoke with at [facility] Agency: Kandee Keen, RNCM with [facility]  Social Determinants of Health (SDOH) Interventions SDOH Screenings   Food Insecurity: No Food Insecurity (01/14/2023)  Housing: Low Risk  (01/14/2023)  Transportation Needs: No Transportation Needs (01/14/2023)  Utilities: Not At Risk (01/14/2023)  Alcohol Screen: Low Risk  (05/21/2022)  Depression (PHQ2-9): Low Risk  (05/21/2022)  Financial Resource Strain: Low Risk  (05/21/2022)  Physical Activity: Insufficiently Active (05/21/2022)  Social Connections: Moderately Isolated (05/21/2022)  Stress: No Stress Concern Present (05/21/2022)  Tobacco Use: Medium Risk (01/14/2023)     Readmission Risk Interventions    01/14/2023   11:16 AM 05/04/2022    3:37 PM  Readmission Risk Prevention Plan  Transportation Screening Complete  Complete  PCP or Specialist Appt within 5-7 Days Complete Complete  Home Care Screening Complete Complete  Medication Review (RN CM) Complete Complete

## 2023-01-19 NOTE — Progress Notes (Signed)
[facility] Liaison Note  This is a current outpatient palliative patient with [employer]. We are following for discharge disposition. Noted that patient is discharging today. Will reach out to patient when she returns home for outpatient palliative follow up.  Please call with any questions.  Thank you, Haynes Bast, BSN, [facility] [phone]

## 2023-01-19 NOTE — Discharge Summary (Addendum)
Physician Discharge Summary  Kathleen [name] YNW:[id number] DOB: Oct 19, 1945 DOA: 01/13/2023  PCP: [name], MD  Admit date: 01/13/2023 Discharge date: 01/19/2023  Admitted From: Home Disposition:  Home  Discharge Condition:Stable CODE STATUS:FULL Diet recommendation: Heart Healthy  Brief/Interim Summary: Patient is a 77 year old female with history of COPD, systolic dysfunction with EF of 35%, chronic hypoxic respiratory failure on 3 to 4 L of oxygen at home who presented from home with shortness of breath.  She was requiring up to 5 L at home, was advised by her pulmonologist to go to the ER.  On presentation ,she was saturating at 90s on 5 L.  Chest x-ray showed stable cardiomegaly, bibasilar atelectasis, small left pleural effusion, no infiltrate.  Patient was admitted for the management of acute COPD exacerbation.  She was treated with IV steroids and antibiotics .  Pulmonology was also following.  She has improved clinically and currently back to her baseline oxygen dependent.  Medically stable for discharge home today.  She will follow-up with pulmonology as an outpatient.  Following problems were addressed during the hospitalization:  Acute on chronic hypoxic respiratory failure: On 3 to 4 L of oxygen at home.  Secondary to COPD exacerbation.  Continue steroids, bronchodilators.  Now back to 4 L of oxygen.   Acute COPD exacerbation: Continue steroids, bronchodilators.  Also started antibiotic: ceftriaxone,completed course.   No significant wheezing today.  Steroids changed to oral.      Pulmonology was consulted  and following.  Continue Johnathan Hausen Ellipta at home.  Also added Pulmicort.  She uses BiPAP at night at home.  She will follow-up with pulmonology in outpatient.   Chronic systolic CHF: Currently appears compensated.  Last EF of 30 to 35% as per echo in April.  On torsemide, Aldactone, losartan at home.  No crackles auscultated,  does not have significant leg edema.  Given   a dose of Lasix 40 mg IV on 8/9.  Restarted home torsemide.   Hypertension: Blood pressure stable.  Continue current medications   Paroxysmal A-fib: Monitor on telemetry.  On Xarelto for anticoagulation.  Also on Tikosyn.  Currently rate is well controlled. Last  QTc of 446   Type 2 diabetes: Continue home regimen and monitor blood sugars.   Hyperlipidemia: Intolerant to statin.   Acquired hypothyroidism: On  levothyroxine   Morbid obesity: BMI of 37.9   CKD stage IIIa: Currently kidney function at baseline    Discharge Diagnoses:  Principal Problem:   Acute exacerbation of chronic obstructive pulmonary disease (COPD) (HCC) Active Problems:   Chronic respiratory failure with hypoxia (HCC)   Chronic systolic heart failure (HCC)   HTN (hypertension)   Atrial fibrillation (HCC)   Sleep apnea   Type 2 diabetes mellitus with diabetic neuropathy, without long-term current use of insulin (HCC)   Stage 3a chronic kidney disease (HCC)   Hyperlipidemia with target LDL less than 100   Acquired hypothyroidism   Morbid obesity (HCC)    Discharge Instructions  Discharge Instructions     Diet - low sodium heart healthy   Complete by: As directed    Discharge instructions   Complete by: As directed    1)Please take prescribed medications as instructed 2)Follow up with your PCP in a week 3)Follow up with pulmonology as an outpatient.  You will be called for appointment.   Increase activity slowly   Complete by: As directed       Allergies as of 01/19/2023  Reactions   Allopurinol Other (See Comments)   Reaction:  Dizziness    Clindamycin Anaphylaxis, Hives   Flublok [influenza Vaccine Recombinant] Other (See Comments)   Fever 103 with no alternative explanation day after vaccine.  Kathleen Braun Highfill FNP-C   Pneumococcal 13-val Conj Vacc Itching, Swelling, Rash   Dronedarone Rash   Brovana [arformoterol]    Caused muscle pain   Budesonide    Caused extreme joint pain    Entresto [sacubitril-valsartan] Other (See Comments)   hypotension   Fosamax [alendronate Sodium] Nausea Only   Jardiance [empagliflozin]    Caused a vaginal infection   Meperidine Nausea And Vomiting   Microplegia Msa-msg [plegisol]    Loopy,diarrhea   Rosuvastatin Other (See Comments)   Reaction:  Muscle spasms    Tetracycline Hives   Adhesive [tape] Rash   Lovastatin Rash, Other (See Comments)   Muscle Pain        Medication List     TAKE these medications    acetaminophen 650 MG CR tablet Commonly known as: TYLENOL Take 1,300 mg by mouth every 8 (eight) hours as needed for pain.   albuterol 108 (90 Base) MCG/ACT inhaler Commonly known as: VENTOLIN HFA Inhale 2 puffs into the lungs every 4 (four) hours as needed for wheezing or shortness of breath. What changed: Another medication with the same name was removed. Continue taking this medication, and follow the directions you see here.   azelastine 0.1 % nasal spray Commonly known as: ASTELIN Place 2 sprays into both nostrils 2 (two) times daily.   chlorpheniramine-HYDROcodone 10-8 MG/5ML Commonly known as: TUSSIONEX Take 5 mLs by mouth at bedtime as needed for cough.   cyanocobalamin 1000 MCG/ML injection Commonly known as: VITAMIN B12 Inject 1 mL (1,000 mcg total) into the muscle every 30 (thirty) days.   docusate sodium 100 MG capsule Commonly known as: COLACE Take 100 mg by mouth 2 (two) times daily.   dofetilide 250 MCG capsule Commonly known as: TIKOSYN Take 1 capsule (250 mcg total) by mouth in the morning and at bedtime.   febuxostat 40 MG tablet Commonly known as: ULORIC TAKE 1 TABLET(40 MG) BY MOUTH DAILY   fluticasone furoate-vilanterol 100-25 MCG/ACT Aepb Commonly known as: Breo Ellipta Inhale 1 puff into the lungs daily.   gabapentin 100 MG capsule Commonly known as: Neurontin Take 2 capsules (200 mg total) by mouth at bedtime.   guaifenesin 400 MG Tabs tablet Commonly known as: HUMIBID  E Take 1 tablet (400 mg total) by mouth every 4 (four) hours as needed. What changed:  when to take this reasons to take this   levalbuterol 0.63 MG/3ML nebulizer solution Commonly known as: XOPENEX Take 3 mLs (0.63 mg total) by nebulization every 6 (six) hours as needed for wheezing or shortness of breath.   losartan 25 MG tablet Commonly known as: COZAAR TAKE 1 TABLET(25 MG) BY MOUTH AT BEDTIME   Magnesium 500 MG Tabs Take 500 mg by mouth at bedtime.   montelukast 10 MG tablet Commonly known as: SINGULAIR Take 1 tablet (10 mg total) by mouth at bedtime.   Nexletol 180 MG Tabs Generic drug: Bempedoic Acid TAKE 1 TABLET BY MOUTH DAILY   OXYGEN Inhale 5 L into the lungs continuous. Use with resmed ventilator   pantoprazole 40 MG tablet Commonly known as: PROTONIX Take 1 tablet (40 mg total) by mouth at bedtime.   potassium chloride 10 MEQ tablet Commonly known as: KLOR-CON Take 1 tablet (10 mEq total) by mouth daily.  predniSONE 20 MG tablet Commonly known as: DELTASONE Take 1 tablet (20 mg total) by mouth daily with breakfast for 4 days. Start taking on: January 20, 2023   spironolactone 25 MG tablet Commonly known as: ALDACTONE TAKE 1 TABLET(25 MG) BY MOUTH EVERY MORNING   torsemide 20 MG tablet Commonly known as: DEMADEX Take 1 tablet (20 mg total) by mouth daily.   Vitamin D3 50 MCG (2000 UT) Tabs Take 2,000 Units by mouth at bedtime.   Xarelto 20 MG Tabs tablet Generic drug: rivaroxaban TAKE 1 TABLET(20 MG) BY MOUTH DAILY WITH SUPPER   Yupelri 175 MCG/3ML nebulizer solution Generic drug: revefenacin Take 3 mLs (175 mcg total) by nebulization daily.        Follow-up Information     [facility] Follow up.   Specialty: Home Health Services Why: [facility] will be providing home health RN.  They will call you to set up a visit in the next 24-48 hours. Contact information: [address] [place] [address] [phone]          [facility] Follow up.   Specialty: PALLIATIVE CARE Why: [facility] will be providing Palliaitive Care Services at home. Contact information: [address] [facility] [place] [address] [phone]               Allergies  Allergen Reactions   Allopurinol Other (See Comments)    Reaction:  Dizziness    Clindamycin Anaphylaxis and Hives   Flublok [Influenza Vaccine Recombinant] Other (See Comments)    Fever 103 with no alternative explanation day after vaccine.  Kathleen Braun Highfill FNP-C   Pneumococcal 13-Val Conj Vacc Itching, Swelling and Rash   Dronedarone Rash   Brovana [Arformoterol]     Caused muscle pain   Budesonide     Caused extreme joint pain   Entresto [Sacubitril-Valsartan] Other (See Comments)    hypotension   Fosamax [Alendronate Sodium] Nausea Only   Jardiance [Empagliflozin]     Caused a vaginal infection   Meperidine Nausea And Vomiting   Microplegia Msa-Msg [Plegisol]     Loopy,diarrhea   Rosuvastatin Other (See Comments)    Reaction:  Muscle spasms    Tetracycline Hives   Adhesive [Tape] Rash   Lovastatin Rash and Other (See Comments)    Muscle Pain    Consultations: Pulmonology   Procedures/Studies: DG CHEST PORT 1 VIEW  Result Date: 01/16/2023 CLINICAL DATA:  Cough EXAM: PORTABLE CHEST 1 VIEW COMPARISON:  01/13/2023 FINDINGS: Left-sided implanted cardiac device remains in place. Stable cardiomegaly. Aortic atherosclerosis. Streaky bibasilar interstitial opacities. Possible small left pleural effusion. No pneumothorax. Advanced degenerative changes of both shoulders. IMPRESSION: 1. Streaky bibasilar interstitial opacities, which may represent atelectasis or mild edema. 2. Possible small left pleural effusion. Electronically Signed   By: Duanne Guess D.O.   On: 01/16/2023 14:10   DG Chest Port 1 View  Result Date: 01/13/2023 CLINICAL DATA:  Shortness of breath. EXAM: PORTABLE CHEST 1 VIEW COMPARISON:  December 21, 2022  FINDINGS: There is stable multi lead AICD positioning. The cardiac silhouette is mildly enlarged and unchanged in size. Mild, diffuse, chronic appearing increased interstitial lung markings are seen. Mild atelectasis is noted within the bilateral lung bases. A small left pleural effusion is seen. No pneumothorax is identified. Degenerative changes seen involving both shoulders and throughout the thoracic spine. IMPRESSION: 1. Stable cardiomegaly with mild bibasilar atelectasis. 2. Small left pleural effusion. Electronically Signed   By: Aram Candela M.D.   On: 01/13/2023  17:27   CUP PACEART REMOTE DEVICE CHECK  Result Date: 12/31/2022 Scheduled remote reviewed. Normal device function.  Next remote 91 days. LA, CVRS  DG Chest 2 View  Result Date: 12/28/2022 CLINICAL DATA:  Cough.  History of COPD, CHF and hypertension. EXAM: CHEST - 2 VIEW COMPARISON:  11/27/2022 FINDINGS: Stable mildly enlarged heart. Tortuous and partially calcified thoracic aorta. Small amount of linear density at both lung bases. No airspace consolidation suspicious for pneumonia. Left subclavian pacemaker with 3 leads, 1 with its tip in the right atrium and 2 with their tips in the right ventricle. Small left atrial appendage device. Thoracic spine degenerative changes. Cholecystectomy clips. Moderate bilateral shoulder degenerative changes with superior migration of the right humeral head and bony remodeling of the humeral head and adjacent acromion, compatible with a large, chronic rotator cuff tear. IMPRESSION: 1. Mild bibasilar linear atelectasis. 2. Stable mild cardiomegaly. 3. No evidence of pneumonia. Electronically Signed   By: Beckie Salts M.D.   On: 12/28/2022 13:45      Subjective: Patient seen and examined at bedside today.  Hemodynamically stable.  She was comfortably sitting at the edge of bed.  On 4 L of oxygen.  Feels ready to go home today.I called and discussed with son [name] about discharge planning on  phone  Discharge Exam: Vitals:   01/19/23 0839 01/19/23 1212  BP:  (!) 115/54  Pulse: 76 71  Resp: 16 19  Temp:  97.7 F (36.5 C)  SpO2: 98% 100%   Vitals:   01/19/23 0500 01/19/23 0819 01/19/23 0839 01/19/23 1212  BP:  (!) 119/51  (!) 115/54  Pulse:  73 76 71  Resp:  17 16 19   Temp:  97.7 F (36.5 C)  97.7 F (36.5 C)  TempSrc:    Oral  SpO2:  100% 98% 100%  Weight: 88.6 kg     Height:        General: Pt is alert, awake, not in acute distress Cardiovascular: RRR, S1/S2 +, no rubs, no gallops Respiratory: CTA bilaterally, no wheezing, no rhonchi Abdominal: Soft, NT, ND, bowel sounds + Extremities: no edema, no cyanosis    The results of significant diagnostics from this hospitalization (including imaging, microbiology, ancillary and laboratory) are listed below for reference.     Microbiology: Recent Results (from the past 240 hour(s))  SARS Coronavirus 2 by RT PCR (hospital order, performed in [facility] hospital lab) *cepheid single result test* Anterior Nasal Swab     Status: None   Collection Time: 01/13/23  5:18 PM   Specimen: Anterior Nasal Swab  Result Value Ref Range Status   SARS Coronavirus 2 by RT PCR NEGATIVE NEGATIVE Final    Comment: (NOTE) SARS-CoV-2 target nucleic acids are NOT DETECTED.  The SARS-CoV-2 RNA is generally detectable in upper and lower respiratory specimens during the acute phase of infection. The lowest concentration of SARS-CoV-2 viral copies this assay can detect is 250 copies / mL. A negative result does not preclude SARS-CoV-2 infection and should not be used as the sole basis for treatment or other patient management decisions.  A negative result may occur with improper specimen collection / handling, submission of specimen other than nasopharyngeal swab, presence of viral mutation(s) within the areas targeted by this assay, and inadequate number of viral copies (<250 copies / mL). A negative result must be combined with  clinical observations, patient history, and epidemiological information.  Fact Sheet for Patients:   RoadLapTop.co.za  Fact Sheet for Healthcare Providers:  http://kim-miller.com/  This test is not yet approved or  cleared by the [place] and has been authorized for detection and/or diagnosis of SARS-CoV-2 by FDA under an Emergency Use Authorization (EUA).  This EUA will remain in effect (meaning this test can be used) for the duration of the COVID-19 declaration under Section 564(b)(1) of the Act, 21 U.S.C. section 360bbb-3(b)(1), unless the authorization is terminated or revoked sooner.  Performed at [facility], [address] [place], [place] [address]      Labs: BNP (last 3 results) Recent Labs    04/28/22 1200 09/15/22 1134 01/13/23 1718  BNP 58.7 48.0 129.5*   Basic Metabolic Panel: Recent Labs  Lab 01/13/23 1718 01/14/23 0659 01/16/23 0801  NA 139 137 137  K 4.5 4.0 4.0  CL 106 104 99  CO2 24 24 26   GLUCOSE 156* 158* 133*  BUN 22 19 40*  CREATININE 0.68 0.73 0.91  CALCIUM 10.5* 10.2 11.0*  MG  --  2.1  --    Liver Function Tests: Recent Labs  Lab 01/14/23 0659  AST 11*  ALT 10  ALKPHOS 29*  BILITOT 0.2*  PROT 5.8*  ALBUMIN 3.1*   No results for input(s): "LIPASE", "AMYLASE" in the last 168 hours. No results for input(s): "AMMONIA" in the last 168 hours. CBC: Recent Labs  Lab 01/13/23 1718 01/14/23 0659  WBC 11.3* 11.3*  NEUTROABS 10.1*  --   HGB 11.5* 10.4*  HCT 34.8* 32.6*  MCV 85.3 87.6  PLT 241 254   Cardiac Enzymes: No results for input(s): "CKTOTAL", "CKMB", "CKMBINDEX", "TROPONINI" in the last 168 hours. BNP: Invalid input(s): "POCBNP" CBG: No results for input(s): "GLUCAP" in the last 168 hours. D-Dimer No results for input(s): "DDIMER" in the last 72 hours. Hgb A1c No results for input(s): "HGBA1C" in the last 72 hours. Lipid Profile No results  for input(s): "CHOL", "HDL", "LDLCALC", "TRIG", "CHOLHDL", "LDLDIRECT" in the last 72 hours. Thyroid function studies No results for input(s): "TSH", "T4TOTAL", "T3FREE", "THYROIDAB" in the last 72 hours.  Invalid input(s): "FREET3" Anemia work up No results for input(s): "VITAMINB12", "FOLATE", "FERRITIN", "TIBC", "IRON", "RETICCTPCT" in the last 72 hours. Urinalysis    Component Value Date/Time   COLORURINE YELLOW 03/17/2022 2244   APPEARANCEUR HAZY (A) 03/17/2022 2244   APPEARANCEUR Hazy 12/25/2012 1653   LABSPEC 1.012 03/17/2022 2244   LABSPEC 1.021 12/25/2012 1653   PHURINE 7.0 03/17/2022 2244   GLUCOSEU NEGATIVE 03/17/2022 2244   GLUCOSEU NEGATIVE 08/07/2021 1219   HGBUR NEGATIVE 03/17/2022 2244   BILIRUBINUR NEGATIVE 03/17/2022 2244   BILIRUBINUR Negative 12/25/2012 1653   KETONESUR NEGATIVE 03/17/2022 2244   PROTEINUR NEGATIVE 03/17/2022 2244   UROBILINOGEN 1.0 08/07/2021 1219   NITRITE NEGATIVE 03/17/2022 2244   LEUKOCYTESUR LARGE (A) 03/17/2022 2244   LEUKOCYTESUR Negative 12/25/2012 1653   Sepsis Labs Recent Labs  Lab 01/13/23 1718 01/14/23 0659  WBC 11.3* 11.3*   Microbiology Recent Results (from the past 240 hour(s))  SARS Coronavirus 2 by RT PCR (hospital order, performed in [facility] hospital lab) *cepheid single result test* Anterior Nasal Swab     Status: None   Collection Time: 01/13/23  5:18 PM   Specimen: Anterior Nasal Swab  Result Value Ref Range Status   SARS Coronavirus 2 by RT PCR NEGATIVE NEGATIVE Final    Comment: (NOTE) SARS-CoV-2 target nucleic acids are NOT DETECTED.  The SARS-CoV-2 RNA is generally detectable in upper and lower respiratory specimens during the acute phase of infection.  The lowest concentration of SARS-CoV-2 viral copies this assay can detect is 250 copies / mL. A negative result does not preclude SARS-CoV-2 infection and should not be used as the sole basis for treatment or other patient management decisions.  A  negative result may occur with improper specimen collection / handling, submission of specimen other than nasopharyngeal swab, presence of viral mutation(s) within the areas targeted by this assay, and inadequate number of viral copies (<250 copies / mL). A negative result must be combined with clinical observations, patient history, and epidemiological information.  Fact Sheet for Patients:   RoadLapTop.co.za  Fact Sheet for Healthcare Providers: http://kim-miller.com/  This test is not yet approved or  cleared by the [place] FDA and has been authorized for detection and/or diagnosis of SARS-CoV-2 by FDA under an Emergency Use Authorization (EUA).  This EUA will remain in effect (meaning this test can be used) for the duration of the COVID-19 declaration under Section 564(b)(1) of the Act, 21 U.S.C. section 360bbb-3(b)(1), unless the authorization is terminated or revoked sooner.  Performed at [facility], [address] [place], [place] [address]     Please note: You were cared for by a hospitalist during your hospital stay. Once you are discharged, your primary care physician will handle any further medical issues. Please note that NO REFILLS for any discharge medications will be authorized once you are discharged, as it is imperative that you return to your primary care physician (or establish a relationship with a primary care physician if you do not have one) for your post hospital discharge needs so that they can reassess your need for medications and monitor your lab values.    Time coordinating discharge: 40 minutes  SIGNED:   Burnadette Pop, MD  [facility] 01/19/2023, 2:08 PM Pager [phone]  If 7PM-7AM, please contact night-coverage [email or web address] Password [email or web address]

## 2023-01-19 NOTE — Progress Notes (Signed)
Occupational Therapy Treatment Patient Details Name: KIMIKA BOURBON MRN: [id number] DOB: 1945/06/13 Today's Date: 01/19/2023   History of present illness Pt is a 77 y.o. female presenting from pulmonologist via POV with SOB on 5L O2 (3L baseline). Admitted for acute exacerbation of COPD. PMH significant for COPD, systolic dysfunction CHF EF of 35% back in April, chronic respiratory failure on 3 to 4 L of oxygen at home   OT comments  Pt is making [place] progress towards their acute OT goals with potential plans to d/c today. Upon arrival, pt reported feeling much better today. Session focused on energy conservation education and how to apply the apply the 5 P's to functional activities. Pt was receptive to education and verbalized great understanding to appyl for safe discharge home. OT to continue to follow acutely to facilitate progress towards established goals. Pt will continue to benefit from [facility].       If plan is discharge home, recommend the following:  A little help with walking and/or transfers;A little help with bathing/dressing/bathroom;Assistance with cooking/housework;Assist for transportation;Help with stairs or ramp for entrance   Equipment Recommendations  None recommended by OT       Precautions / Restrictions Precautions Precautions: Fall Restrictions Weight Bearing Restrictions: No       Mobility Bed Mobility Overal bed mobility: Modified Independent                  Transfers Overall transfer level: Needs assistance Equipment used: None, Rollator (4 wheels) Transfers: Sit to/from Stand Sit to Stand: Contact guard assist                 Balance Overall balance assessment: Needs assistance Sitting-balance support: No upper extremity supported, Feet supported Sitting balance-Leahy Scale: Good     Standing balance support: During functional activity, No upper extremity supported Standing balance-Leahy Scale: Fair                              ADL either performed or assessed with clinical judgement   ADL Overall ADL's : Needs assistance/impaired                                       General ADL Comments: session focused on education of energy conservation techniques during ADLs    Extremity/Trunk Assessment Upper Extremity Assessment Upper Extremity Assessment: RUE deficits/detail RUE Deficits / Details: Prior rotator cuff surgery per pt (20 years ago) has recently received PT. Able to perform forward flexion to ~110 degreesn AROM, but unable to tolerate challenge/MMT RUE Coordination: decreased gross motor   Lower Extremity Assessment Lower Extremity Assessment: Defer to PT evaluation        Vision   Vision Assessment?: No apparent visual deficits   Perception Perception Perception: Within Functional Limits   Praxis Praxis Praxis: WFL    Cognition Arousal: Alert Behavior During Therapy: WFL for tasks assessed/performed Overall Cognitive Status: Within Functional Limits for tasks assessed                 General Comments: educated on energy conservation, pt very receptive and appreciative              General Comments VSS, pt reports feeling much better today    Pertinent Vitals/ Pain       Pain Assessment Pain Assessment: No/denies pain Pain Intervention(s): Monitored during session  Frequency  Min 1X/week        Progress Toward Goals  OT Goals(current goals can now be found in the care plan section)  Progress towards OT goals: Progressing toward goals  Acute Rehab OT Goals Patient Stated Goal: home soon OT Goal Formulation: With patient Time For Goal Achievement: 01/28/23 Potential to Achieve Goals: Good ADL Goals Pt Will Perform Grooming: with modified independence;standing Pt Will Perform Upper Body Dressing: with modified independence;sitting Pt Will Perform Lower Body Dressing: with modified independence;sit to/from stand Pt Will Transfer to  Toilet: with modified independence;ambulating;regular height toilet Additional ADL Goal #1: Pt will identify and implement 2+ energy conservation strategies for use in the home setting. Additional ADL Goal #2: Pt will follow 3 step commands during ADL to optimize independence in ADL.   AM-PAC OT "6 Clicks" Daily Activity     Outcome Measure   Help from another person eating meals?: None Help from another person taking care of personal grooming?: A Little Help from another person toileting, which includes using toliet, bedpan, or urinal?: A Little Help from another person bathing (including washing, rinsing, drying)?: A Little Help from another person to put on and taking off regular upper body clothing?: A Little Help from another person to put on and taking off regular lower body clothing?: A Little 6 Click Score: 19    End of Session    OT Visit Diagnosis: Unsteadiness on feet (R26.81);Muscle weakness (generalized) (M62.81)   Activity Tolerance Patient tolerated treatment well   Patient Left in bed;with call bell/phone within reach   Nurse Communication Mobility status        Time:  -     Charges: OT General Charges $OT Visit: 1 Visit OT Treatments $Self Care/Home Management : 8-22 mins  Derenda Mis, OTR/L Acute Rehabilitation Services Office [phone] Secure Chat Communication Preferred   Donia Pounds 01/19/2023, 11:41 AM

## 2023-01-19 NOTE — Plan of Care (Signed)
  Problem: Education: Goal: Knowledge of General Education information will improve Description: Including pain rating scale, medication(s)/side effects and non-pharmacologic comfort measures Outcome: Progressing   Problem: Clinical Measurements: Goal: Ability to maintain clinical measurements within normal limits will improve Outcome: Progressing Goal: Will remain free from infection Outcome: Progressing Goal: Diagnostic test results will improve Outcome: Progressing Goal: Respiratory complications will improve Outcome: Progressing Goal: Cardiovascular complication will be avoided Outcome: Progressing   Problem: Activity: Goal: Risk for activity intolerance will decrease Outcome: Progressing   Problem: Nutrition: Goal: Adequate nutrition will be maintained Outcome: Progressing   Problem: Coping: Goal: Level of anxiety will decrease Outcome: Progressing   Problem: Elimination: Goal: Will not experience complications related to bowel motility Outcome: Progressing Goal: Will not experience complications related to urinary retention Outcome: Progressing   Problem: Safety: Goal: Ability to remain free from injury will improve Outcome: Progressing   Problem: Pain Managment: Goal: General experience of comfort will improve Outcome: Progressing

## 2023-01-20 ENCOUNTER — Ambulatory Visit (INDEPENDENT_AMBULATORY_CARE_PROVIDER_SITE_OTHER): Payer: Medicare Other | Admitting: Pulmonary Disease

## 2023-01-20 ENCOUNTER — Encounter (HOSPITAL_BASED_OUTPATIENT_CLINIC_OR_DEPARTMENT_OTHER): Payer: Self-pay | Admitting: Pulmonary Disease

## 2023-01-20 ENCOUNTER — Telehealth: Payer: Self-pay | Admitting: *Deleted

## 2023-01-20 ENCOUNTER — Encounter: Payer: Self-pay | Admitting: *Deleted

## 2023-01-20 ENCOUNTER — Telehealth (HOSPITAL_BASED_OUTPATIENT_CLINIC_OR_DEPARTMENT_OTHER): Payer: Self-pay | Admitting: Pulmonary Disease

## 2023-01-20 VITALS — HR 87 | Ht 62.0 in | Wt 191.0 lb

## 2023-01-20 DIAGNOSIS — J441 Chronic obstructive pulmonary disease with (acute) exacerbation: Secondary | ICD-10-CM

## 2023-01-20 DIAGNOSIS — J4489 Other specified chronic obstructive pulmonary disease: Secondary | ICD-10-CM

## 2023-01-20 DIAGNOSIS — J9611 Chronic respiratory failure with hypoxia: Secondary | ICD-10-CM

## 2023-01-20 MED ORDER — LEVALBUTEROL HCL 0.63 MG/3ML IN NEBU
0.6300 mg | INHALATION_SOLUTION | Freq: Four times a day (QID) | RESPIRATORY_TRACT | 12 refills | Status: AC | PRN
Start: 2023-01-20 — End: ?

## 2023-01-20 MED ORDER — LEVALBUTEROL HCL 0.63 MG/3ML IN NEBU: 0.6300 mg | INHALATION_SOLUTION | Freq: Four times a day (QID) | RESPIRATORY_TRACT | 12 refills | Status: DC | PRN

## 2023-01-20 MED ORDER — PREDNISONE 10 MG PO TABS
ORAL_TABLET | ORAL | 0 refills | Status: DC
Start: 2023-01-20 — End: 2023-02-04

## 2023-01-20 NOTE — Telephone Encounter (Signed)
Spoke with patient and updated prescriptions and confirmed pharmacy [facility] not [facility].

## 2023-01-20 NOTE — Telephone Encounter (Signed)
Patient states for the xopenex nebs- pharmacy needs diagnosis code for Medicare in order to fill medicine. Also tech from the pharmacy states there is no order for prednisone. Please advise. Patient is currently at the pharmacy.   [facility] off [address]

## 2023-01-20 NOTE — Assessment & Plan Note (Signed)
We will provide her with a longer prednisone taper to prevent readmission. I have also asked her to use half dose of codeine at night

## 2023-01-20 NOTE — Assessment & Plan Note (Signed)
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 Will continue her on the regimen of Breo/Yupelri which she seems to have tolerated with using Xopenex nebs for breakthrough.  Will try to avoid albuterol given her history of atrial fibrillation

## 2023-01-20 NOTE — Patient Instructions (Addendum)
Use 1/2 the dose of codeine Get back on BIPAP machine Please record your oxygen sat when you are short of breath   xPrednisone 10 mg tabs  Take 2 tabs daily with food x 5ds, then 1 tab daily with food x 5ds then STOP  X Rx for xopenex nebs to pharmacy

## 2023-01-20 NOTE — Transitions of Care (Post Inpatient/ED Visit) (Signed)
01/20/2023  Name: Kathleen [name] MRN: [id number] DOB: 1945-08-21  Today's TOC FU Call Status: Today's TOC FU Call Status:: Successful TOC FU Call Completed TOC FU Call Complete Date: 01/20/23  Transition Care Management Follow-up Telephone Call Date of Discharge: 01/19/23 Discharge Facility: [facility]) Type of Discharge: Inpatient Admission Primary Inpatient Discharge Diagnosis:: COPD exacerbation/ Shortness of breath How have you been since you were released from the hospital?: Better ("I am very weak.  They had an appointment this morning with Dr. Vassie Loll that was available, so I saw him today and am just waiting for the new nebulizer medication Xopenex to be ready, but the outpatient pharmacy said they were having trouble getting it") Any questions or concerns?: Yes Patient Questions/Concerns:: Attended pulmonary provider office visit this morning- Xopenex nebulizer solution "may not" be available: patient confirms she is waiting to hear back from outpatient pharmacy on status of medication availability Patient Questions/Concerns Addressed: Other: (Encouraged patient to continue communicating with outpatient pharmacy; encouraged her to remain in touch with pulmonary provider if ongoing issues/ concerns; scheduled with RN CM for follow up; provided my direct contact information)  Items Reviewed: Did you receive and understand the discharge instructions provided?: Yes (thoroughly reviewed with patient who verbalizes good understanding of same) Medications obtained,verified, and reconciled?: Yes (Medications Reviewed) (Full medication reconciliation/ review completed; no concerns or discrepancies identified; self-manages medications and reports is working with outpatient pharmacy to obtain medications prescribed today) Any new allergies since your discharge?: No Dietary orders reviewed?: Yes Type of Diet Ordered:: Heart Healthy, low salt-- "trying my best to follow" Do you have support at  home?: Yes People in Home: facility resident Name of Support/Comfort Primary Source: Reports lives in IL[facility] [place]) and is essentially independent in self-care activities; local son assists as/ if needed/ indicated  Medications Reviewed Today: Medications Reviewed Today     Reviewed by Michaela Corner, RN (Registered Nurse) on 01/20/23 at 1341  Med List Status: <None>   Medication Order Taking? Sig Documenting Provider Last Dose Status Informant  acetaminophen (TYLENOL) 650 MG CR tablet [id number] Yes Take 1,300 mg by mouth every 8 (eight) hours as needed for pain. [provider] Taking Active Self, Pharmacy Records  albuterol (VENTOLIN HFA) 108 (90 Base) MCG/ACT inhaler [id number] Yes Inhale 2 puffs into the lungs every 4 (four) hours as needed for wheezing or shortness of breath. Nyoka Cowden, MD Taking Active Self, Pharmacy Records           Med Note Otelia Limes Jan 20, 2023  1:18 PM) 01/20/23: Reports during [facility] call is not using due to side effect of increased heart rate- is waiting for Xopenex to be filled after pulmonary office visit 01/20/23  azelastine (ASTELIN) 0.1 % nasal spray [id number] Yes Place 2 sprays into both nostrils 2 (two) times daily. Oretha Milch, MD Taking Active Self, Pharmacy Records  chlorpheniramine-HYDROcodone [facility]) 10-8 MG/5ML [id number] Yes Take 5 mLs by mouth at bedtime as needed for cough. Burnadette Pop, MD Taking Active   Cholecalciferol (VITAMIN D3) 50 MCG (2000 UT) TABS [id number] Yes Take 2,000 Units by mouth at bedtime. [provider] Taking Active Self, Pharmacy Records  cyanocobalamin (VITAMIN B12) 1000 MCG/ML injection [id number] Yes Inject 1 mL (1,000 mcg total) into the muscle every 30 (thirty) days. [name], MD Taking Active Self, Pharmacy Records  docusate sodium (COLACE) 100 MG capsule [id number] Yes Take 100 mg by mouth 2 (two) times daily. [provider]  Taking Active Self, Pharmacy Records   dofetilide [facility]) 250 MCG capsule [id number] Yes Take 1 capsule (250 mcg total) by mouth in the morning and at bedtime. Fenton, Laural Benes, PA Taking Active Self, Pharmacy Records  febuxostat (ULORIC) 40 MG tablet [id number] Yes TAKE 1 TABLET(40 MG) BY MOUTH DAILY Rice, Jamesetta Orleans, MD Taking Active Self, Pharmacy Records  fluticasone furoate-vilanterol (BREO ELLIPTA) 100-25 MCG/ACT AEPB [id number] Yes Inhale 1 puff into the lungs daily. Noemi Chapel, NP Taking Active Self, Pharmacy Records  gabapentin (NEURONTIN) 100 MG capsule [id number] Yes Take 2 capsules (200 mg total) by mouth at bedtime. Windell Norfolk, MD Taking Active Self, Pharmacy Records  guaifenesin (HUMIBID E) 400 MG TABS tablet [id number] Yes Take 1 tablet (400 mg total) by mouth every 4 (four) hours as needed. Burnadette Pop, MD Taking Active   levalbuterol [facility]) 0.63 MG/3ML nebulizer solution [id number] No Take 3 mLs (0.63 mg total) by nebulization every 6 (six) hours as needed for wheezing or shortness of breath. Oretha Milch, MD Unknown Active            Med Note Otelia Limes Jan 20, 2023  1:15 PM) 01/20/23: Reports during TOC call that hs waiting on outpatient pharmacy to fill  losartan (COZAAR) 25 MG tablet [id number] Yes TAKE 1 TABLET(25 MG) BY MOUTH AT BEDTIME Bensimhon, [name], MD Taking Active Self, Pharmacy Records  Magnesium 500 MG TABS [id number] Yes Take 500 mg by mouth at bedtime. [provider] Taking Active Self, Pharmacy Records  montelukast (SINGULAIR) 10 MG tablet [id number] Yes Take 1 tablet (10 mg total) by mouth at bedtime. Burnadette Pop, MD Taking Active   NEXLETOL 180 MG TABS [id number] Yes TAKE 1 TABLET BY MOUTH DAILY [name], MD Taking Active Self, Pharmacy Records  OXYGEN [id number] Yes Inhale 5 L into the lungs continuous. Use with resmed ventilator [provider] Taking Active Self, Pharmacy Records  potassium chloride (KLOR-CON) 10 MEQ tablet [id number]  Take 1  tablet (10 mEq total) by mouth daily. [place], [place]  Expired 01/14/23 2359 Self, Pharmacy Records  predniSONE (DELTASONE) 20 MG tablet [id number] Yes Take 1 tablet (20 mg total) by mouth daily with breakfast for 4 days. Burnadette Pop, MD Taking Active   revefenacin [facility]) 175 MCG/3ML nebulizer solution [id number] Yes Take 3 mLs (175 mcg total) by nebulization daily. Oretha Milch, MD Taking Active Self, Pharmacy Records  spironolactone (ALDACTONE) 25 MG tablet [id number] Yes TAKE 1 TABLET(25 MG) BY MOUTH EVERY MORNING Bensimhon, [name], MD Taking Active Self, Pharmacy Records  torsemide (DEMADEX) 20 MG tablet [id number] Yes Take 1 tablet (20 mg total) by mouth daily. Milford, [name], FNP Taking Active Self, Pharmacy Records  XARELTO 20 [place] TABS tablet [id number] Yes TAKE 1 TABLET(20 MG) BY MOUTH DAILY WITH SUPPER Bensimhon, [name], MD Taking Active Self, Pharmacy Records           Home Care and Equipment/Supplies: Were Home Health Services Ordered?: Yes Name of Home Health Agency:: [facility]- RN Has Agency set up a time to come to your home?: No (provided education around time frame to hear from home health agency; confirmed she has contact information for home health team; she reports she is "not sure she needs home health;" provided my number if she has difficulty establishing services) EMR reviewed for Home Health Orders: Orders present/patient has not received call (refer to CM for follow-up) Any new equipment or medical supplies ordered?: No  Functional Questionnaire: Do you need assistance with bathing/showering or dressing?: No Do you need assistance with meal preparation?: No (cooks for self on occasion; ILF assists with one meal per day) Do you need assistance with eating?: No Do you have difficulty maintaining continence: No Do you need assistance with getting out of bed/getting out of a chair/moving?: No Do you have difficulty managing or taking your  medications?: No  Follow up appointments reviewed: PCP Follow-up appointment confirmed?: NA (verified not indicated per hospital discharging provider discharge notes-- attended specialist provider appointment this morning) Specialist Hospital Follow-up appointment confirmed?: Yes Date of Specialist follow-up appointment?: 01/20/23 Follow-Up Specialty Provider:: Pulmonary provider-- verified attended as scheduled earlier today Do you need transportation to your follow-up appointment?: No Do you understand care options if your condition(s) worsen?: Yes-patient verbalized understanding  SDOH Interventions Today    Flowsheet Row Most Recent Value  SDOH Interventions   Food Insecurity Interventions Intervention Not Indicated  Transportation Interventions Intervention Not Indicated  [uses transportation services with IL[facility] ([employer]) and local adult son assists as needed also]      TOC Interventions Today    Flowsheet Row Most Recent Value  TOC Interventions   TOC Interventions Discussed/Reviewed TOC Interventions Discussed  [provided my direct contact information should questions/ concerns/ needs arise post-TOC call, prior to RN CM telephone visit 02/03/23]      Interventions Today    Flowsheet Row Most Recent Value  Chronic Disease   Chronic disease during today's visit Chronic Obstructive Pulmonary Disease (COPD), Other  [on home O2 and Bi-pap]  General Interventions   General Interventions Discussed/Reviewed General Interventions Discussed, Doctor Visits, Referral to Nurse, Communication with, Durable Medical Equipment (DME)  [scheduled with RN CM Care Coordinator for follow up telephone visit on 02/03/23]  Doctor Visits Discussed/Reviewed PCP, Specialist, Doctor Visits Reviewed, Doctor Visits Discussed  [reviewed HFU OV with pulmonary provider from this morning's appointment]  Durable Medical Equipment (DME) Dan Humphreys, Oxygen, Other  [Home Bipap, confirmed currently requiring/ using  assistive devices - rollator walker]  PCP/Specialist Visits Compliance with follow-up visit  Communication with RN  Exercise Interventions   Exercise Discussed/Reviewed Exercise Discussed  [encouraged to pace activity given weakness post-hospital discharge]  Education Interventions   Education Provided Provided Education  Provided Verbal Education On Medication  [process to obtain newly prescribed medications from outpatient pharmacy]  Nutrition Interventions   Nutrition Discussed/Reviewed Nutrition Discussed  Pharmacy Interventions   Pharmacy Dicussed/Reviewed Pharmacy Topics Discussed  [Full medication review with updating medication list in EHR per patient report]  Safety Interventions   Safety Discussed/Reviewed Safety Discussed, Fall Risk, Home Safety  [use of home O2]  Home Safety Assistive Devices      [place] , RN, BSN, CCRN Alumnus RN CM Care Coordination/ Transition of Care- [facility] Care Management [phone]: direct office

## 2023-01-20 NOTE — Progress Notes (Signed)
Subjective:    Patient ID: Kathleen [name], female    DOB: January 25, 1946, 77 y.o.   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 Renaee Munda at [facility] interventional pulmonary and felt not to be a candidate for stent placement for tracheobronchomalacia     PMH - BOOP in 2013 HFrEF,  persistent AF s/p AVN ablation and pacemaker implant 2017 (has failed upgrade to BiV), on Tikosyn -left nasal bleeds  Desensitized to penicillin   [facility] 11/2020 for pneumonia, respiratory failure hospitalized 03/2022 for sepsis  related to pneumonia, CTA was negative for PE or acute airspace disease. admitted from 04/2022 for acute on chronic respiratory failure secondary to RSV pneumonia/Boop? >> prolonged steroid taper upon discharge.  Meds -She was switched from an inhaler regimen in 2022 to budesonide/Brovana and Yupelri combination -did not tolerate budesonide/Brovana due to joint pains - did not tolerate symbicort  Last OV 01/13/23 with my partner dr Everardo All, had increasing oxygen requirements up to 5 L, baseline is 3 L and increased weight in spite of outpatient diuretics and was advised hospital admission She was diuresed and treated with steroids and bronchodilators  She is maintained on a regimen of Breo and Yupelri  She is followed by palliative care , she now resides at an assisted living facility, arrives with her son, on oxygen.  Her oxygen machine goes up to 5 L.  She wonders if she needs more. There are times when she is short of breath and now when she is down to 3 L she feels short of breath. There are times when she feels loopy during the daytime.  She is taking codeine cough syrup for cough.  She is also on gabapentin  She was discharged with a 4-day taper of 20 mg  prednisone she requests additional dosing because she has always needed a longer taper. She also request Xopenex nebs to be sent to her pharmacy.  She has atrial fibrillation  Weight is down to 191 pounds  Significant tests/ events reviewed BiPAP titration study 01/2021 12/8 cm   ABG 03/2022 7.3 4/33/87 04/2022 CT sinuses diffuse paranasal sinus disease  03/2022 CTA chest neg  08/2021 CT chest without contrast emphysema, stable nodules   HRCT 01/2021 emphysema, tracheobronchomalacia , bland scarring both bases , new atelectasis/consolidation of the lingula with scattered groundglass in left upper lobe   01/2020  With exertion her lowest oxygen saturation was 92% on room air. Her peak HR was 103. She walked a total of 68m which was 25% predicted     Cleda Daub 01/2021 severe airway obstruction, ratio 59, FEV1 0.95/48%, FVC 1.61/64%  Review of Systems neg for any significant sore throat, dysphagia, itching, sneezing, nasal congestion or excess/ purulent secretions, fever, chills, sweats, unintended wt loss, pleuritic or exertional cp, hempoptysis, orthopnea pnd or change in chronic leg swelling. Also denies presyncope, palpitations, heartburn, abdominal pain, nausea, vomiting, diarrhea or change in bowel or urinary habits, dysuria,hematuria, rash, arthralgias, visual complaints, headache, numbness weakness or ataxia.      Objective:   Physical Exam  Gen. Pleasant, obese, in no distress ENT - no lesions, no post nasal drip Neck: No JVD, no thyromegaly, no carotid bruits Lungs: no use of accessory muscles, no dullness to percussion, decreased without rales or rhonchi  Cardiovascular: Rhythm regular, heart sounds  normal, no murmurs or gallops, no peripheral edema Musculoskeletal: No deformities, no cyanosis or clubbing , no tremors       Assessment & Plan:

## 2023-01-20 NOTE — Assessment & Plan Note (Signed)
Not clear whether her oxygen requirements have really increased or she has just been using higher level of oxygen due to subjective feelings of shortness of breath.  I have asked her to obtain oxygen saturations when she feels short of breath. She will continue to use BiPAP during sleep I have asked her to get back on this.  This likely helps with some stenting of the airways during sleep for tracheobronchomalacia

## 2023-01-21 ENCOUNTER — Ambulatory Visit: Payer: Medicare Other | Admitting: Internal Medicine

## 2023-01-21 DIAGNOSIS — Z5181 Encounter for therapeutic drug level monitoring: Secondary | ICD-10-CM

## 2023-01-21 DIAGNOSIS — M1A9XX0 Chronic gout, unspecified, without tophus (tophi): Secondary | ICD-10-CM

## 2023-01-21 DIAGNOSIS — M25559 Pain in unspecified hip: Secondary | ICD-10-CM

## 2023-01-21 DIAGNOSIS — N1831 Chronic kidney disease, stage 3a: Secondary | ICD-10-CM

## 2023-01-21 DIAGNOSIS — G8929 Other chronic pain: Secondary | ICD-10-CM

## 2023-01-21 DIAGNOSIS — M10042 Idiopathic gout, left hand: Secondary | ICD-10-CM

## 2023-01-25 ENCOUNTER — Ambulatory Visit: Payer: Medicare Other | Attending: Cardiology

## 2023-01-25 DIAGNOSIS — Z950 Presence of cardiac pacemaker: Secondary | ICD-10-CM | POA: Diagnosis not present

## 2023-01-25 DIAGNOSIS — I5022 Chronic systolic (congestive) heart failure: Secondary | ICD-10-CM

## 2023-01-26 ENCOUNTER — Telehealth: Payer: Self-pay | Admitting: Internal Medicine

## 2023-01-26 NOTE — Telephone Encounter (Signed)
Renika called from [facility] wanting skilled nursing for once a week for 5 weeks. Pt also needing medication education and cardio and pulmonary assessment.

## 2023-01-27 ENCOUNTER — Other Ambulatory Visit: Payer: Self-pay | Admitting: *Deleted

## 2023-01-27 ENCOUNTER — Ambulatory Visit: Payer: Medicare Other | Attending: Internal Medicine | Admitting: Internal Medicine

## 2023-01-27 ENCOUNTER — Encounter: Payer: Self-pay | Admitting: Internal Medicine

## 2023-01-27 VITALS — BP 112/67 | HR 73 | Resp 16 | Ht 60.0 in | Wt 192.0 lb

## 2023-01-27 DIAGNOSIS — M10042 Idiopathic gout, left hand: Secondary | ICD-10-CM | POA: Insufficient documentation

## 2023-01-27 DIAGNOSIS — M1A9XX0 Chronic gout, unspecified, without tophus (tophi): Secondary | ICD-10-CM | POA: Insufficient documentation

## 2023-01-27 DIAGNOSIS — G5601 Carpal tunnel syndrome, right upper limb: Secondary | ICD-10-CM | POA: Insufficient documentation

## 2023-01-27 DIAGNOSIS — M25512 Pain in left shoulder: Secondary | ICD-10-CM | POA: Insufficient documentation

## 2023-01-27 DIAGNOSIS — M25559 Pain in unspecified hip: Secondary | ICD-10-CM | POA: Diagnosis present

## 2023-01-27 DIAGNOSIS — G8929 Other chronic pain: Secondary | ICD-10-CM | POA: Diagnosis present

## 2023-01-27 DIAGNOSIS — N1831 Chronic kidney disease, stage 3a: Secondary | ICD-10-CM | POA: Insufficient documentation

## 2023-01-27 DIAGNOSIS — Z5181 Encounter for therapeutic drug level monitoring: Secondary | ICD-10-CM | POA: Diagnosis not present

## 2023-01-27 NOTE — Progress Notes (Signed)
Referral placed.

## 2023-01-27 NOTE — Progress Notes (Signed)
EPIC Encounter for ICM Monitoring  Patient Name: Kathleen [name] is a 77 y.o. female Date: 01/27/2023 Primary Care Physican: [name], MD Primary Cardiologist: Bensimhon Electrophysiologist: Camnitz Bi-V Pacing: 100%          07/28/2022 Weight: 184.6 lbs 09/07/2022   Weight: 187 lbs 11/17/2022 Weight: 190-191 lbs                                                      Spoke with patient and heart failure questions reviewed.  Transmission results reviewed.  Pt was recently in the hospital for COPD and still feeling very weak.     Diet:  She lives in an assisted living  Eats foods that are prepared at the facility she lives in which are not low salt.     Optivol thoracic impedance suggesting normal fluid levels with the exception of possible fluid accumulation from 8/1-8/6.   Prescribed:  Torsemide 20 mg take 1 tablet(s) (20 mg total) by mouth daily.   Potassium 10 mEq take 1 tablet (10 mEq total) by mouth daily  Spironolactone 25 mg take 1 tablet daily   Labs: 01/16/2023 Creatinine 0.91, BUN 40, Potassium 4.0, Sodium 137, GFR >60  01/14/2023 Creatinine 0.73, BUN 19, Potassium 4.0, Sodium 137, GFR >60  01/13/2023 Creatinine 0.68, BUN 22, Potassium 4.5, Sodium 139, GFR >60  09/15/2022 Creatinine 0.84, BUN 19, Potassium 4.3, Sodium 139, GFR >60 A complete set of results can be found in Results Review.   Recommendations:  No changes and encouraged to call if experiencing any fluid symptoms.   Follow-up plan: ICM clinic phone appointment on 03/01/2023.  91 day device clinic remote transmission 03/31/2023.     EP/Cardiology Office Visits:    02/04/2023 with Dr Gala Romney.  Recall 05/13/2023 with Francis Dowse, PA or Otilio Saber, [place].     Copy of ICM check sent to Dr. Elberta Fortis.   3 month ICM trend: 01/25/2023.    12-14 Month ICM trend:     Karie Soda, RN 01/27/2023 3:26 PM

## 2023-01-28 LAB — COMPLETE METABOLIC PANEL WITH GFR
AG Ratio: 1.7 (calc) (ref 1.0–2.5)
ALT: 11 U/L (ref 6–29)
AST: 9 U/L — ABNORMAL LOW (ref 10–35)
Albumin: 4.3 g/dL (ref 3.6–5.1)
Alkaline phosphatase (APISO): 42 U/L (ref 37–153)
BUN/Creatinine Ratio: 37 (calc) — ABNORMAL HIGH (ref 6–22)
BUN: 32 mg/dL — ABNORMAL HIGH (ref 7–25)
CO2: 32 mmol/L (ref 20–32)
Calcium: 10.4 mg/dL (ref 8.6–10.4)
Chloride: 96 mmol/L — ABNORMAL LOW (ref 98–110)
Creat: 0.86 mg/dL (ref 0.60–1.00)
Globulin: 2.5 g/dL (ref 1.9–3.7)
Glucose, Bld: 128 mg/dL — ABNORMAL HIGH (ref 65–99)
Potassium: 4.2 mmol/L (ref 3.5–5.3)
Sodium: 139 mmol/L (ref 135–146)
Total Bilirubin: 0.5 mg/dL (ref 0.2–1.2)
Total Protein: 6.8 g/dL (ref 6.1–8.1)
eGFR: 70 mL/min/{1.73_m2} (ref >=60)

## 2023-01-28 LAB — URIC ACID: Uric Acid, Serum: 7.3 mg/dL — ABNORMAL HIGH (ref 2.5–7.0)

## 2023-01-28 NOTE — Telephone Encounter (Signed)
[phone] sorry!

## 2023-01-28 NOTE — Progress Notes (Signed)
Uric acid level is 7.3 this is higher than the goal of 6 for preventing new gout flares.  But with only 1 attack in the past year this would be okay to monitor if she starts to have any more episodes this year will need to increase that Uloric. Metabolic panel shows no changes in liver or kidney function.

## 2023-02-01 ENCOUNTER — Other Ambulatory Visit: Payer: Self-pay

## 2023-02-01 MED ORDER — RIVAROXABAN 20 MG PO TABS: 20.0000 mg | ORAL_TABLET | Freq: Every day | ORAL | 5 refills | Status: DC

## 2023-02-01 NOTE — Telephone Encounter (Signed)
Prescription refill request for Xarelto received.  Indication:afib Last office visit:6/24 Weight:87.1  kg Age:77 Scr:0.86  8/24 CrCl:76.52  ml/min  Prescription refilled

## 2023-02-03 ENCOUNTER — Ambulatory Visit: Payer: Self-pay

## 2023-02-03 NOTE — Patient Instructions (Addendum)
Visit Information  Thank you for taking time to visit with me today. Please don't hesitate to contact me if I can be of assistance to you.   Following are the goals we discussed today:  Continue to take medications as prescribed. Continue to attend provider visits as scheduled Continue to eat healthy, lean meats, vegetables, fruits, avoid saturated and transfats Continue to Monitor weights, check blood pressure, oxygen saturation levels as recommended by providers and contact providers if questions or concerns  Our next appointment is by telephone on 02/17/23 at 10:30 am  Please call the care guide team at [phone] if you need to cancel or reschedule your appointment.   If you are experiencing a Mental Health or Behavioral Health Crisis or need someone to talk to, please call the Suicide and Crisis Lifeline: 988 call the [place] National Suicide Prevention Lifeline: 531 645 2117 or TTY: 9121194710 TTY (718) 335-3201) to talk to a trained counselor call 1-800-273-TALK (toll free, 24 hour hotline)  Kathyrn Sheriff, RN, MSN, BSN, CCM Care Management Coordinator [phone]

## 2023-02-03 NOTE — Patient Outreach (Signed)
  Care Coordination   Initial Visit Note   02/03/2023 Name: Kathleen [name] MRN: [id number] DOB: 30-Jan-1946  Kathleen [name] is a 77 y.o. year old female who sees [name], MD for primary care. I spoke with  [name] by phone today.  What matters to the patients health and wellness today?  Recent admission 01/13/23-01/19/23 for COPD exacerbation. Patient with a history of chf: Kathleen [name] reports increased weight gain 7lbs over night last week, She states she went down about 4 pounds and yesterday up 3 and today up 3 pounds. She reports she is taking her medications as prescribed. She has an appointment with advanced heart failure clinic, Dr. Gala Romney, tomorrow. Office visit with pulmonologist completed on 01/20/23 and follow up with rheumatologist completed on 01/27/23.   Goals Addressed             This Visit's Progress    Assist with health management post hospitalization       Interventions Today    Flowsheet Row Most Recent Value  Chronic Disease   Chronic disease during today's visit Chronic Obstructive Pulmonary Disease (COPD), Congestive Heart Failure (CHF), Hypertension (HTN)  General Interventions   General Interventions Discussed/Reviewed General Interventions Discussed, Doctor Visits, Durable Medical Equipment (DME)  [discussed annual visit coming up with PCP due 05/2024. encouraged patient to schedule]  Doctor Visits Discussed/Reviewed Doctor Visits Discussed, PCP, Specialist  Durable Medical Equipment (DME) Oxygen, Other, BP Cuff, Walker  [BiPap machine, per patient [employer] is oxygen supplier]  PCP/Specialist Visits Compliance with follow-up visit  [reviewed upcoming appointments. RNCM encouraged patient to attend provider visits as scheduled]  Exercise Interventions   Exercise Discussed/Reviewed Exercise Discussed  [Reports active with physical therapy associated with her ILF.]  Education Interventions   Education Provided Provided Education  Provided Verbal  Education On Nutrition, Exercise, Medication, When to see the doctor, Other  [advised to continue to attend provider visits as scheduled, take medications as prescribed, contact provider with health questions/concerns. advised to continue to monitor weights and BP, O2 sats as recommended by provider.]  Nutrition Interventions   Nutrition Discussed/Reviewed Nutrition Discussed  [discussed monitoring sodium intake. patient reports she is aware and monitors]  Pharmacy Interventions   Pharmacy Dicussed/Reviewed Pharmacy Topics Discussed  [medications review completed]  Safety Interventions   Safety Discussed/Reviewed Fall Risk, Safety Discussed  [confirmed patient is active with therapy at the ILF where she resides]  Home Safety Assistive Devices  Advanced Directive Interventions   Advanced Directives Discussed/Reviewed End of Life  End of Life Palliative  [confirmed active with [facility] palliative care]            SDOH assessments and interventions completed:  Yes  Care Coordination Interventions:  Yes, provided   Follow up plan: Follow up call scheduled for 02/16/13    Encounter Outcome:  Pt. Visit Completed   Kathyrn Sheriff, RN, MSN, BSN, CCM Care Management Coordinator [phone]

## 2023-02-04 ENCOUNTER — Ambulatory Visit (HOSPITAL_COMMUNITY)
Admission: RE | Admit: 2023-02-04 | Discharge: 2023-02-04 | Disposition: A | Discharge status: Home / Self Care | Payer: Medicare Other | Source: Ambulatory Visit | Attending: Internal Medicine | Admitting: Internal Medicine

## 2023-02-04 ENCOUNTER — Encounter (HOSPITAL_COMMUNITY): Payer: Self-pay | Admitting: Internal Medicine

## 2023-02-04 VITALS — BP 136/0 | HR 78 | Wt 194.0 lb

## 2023-02-04 DIAGNOSIS — N189 Chronic kidney disease, unspecified: Secondary | ICD-10-CM | POA: Insufficient documentation

## 2023-02-04 DIAGNOSIS — Z87891 Personal history of nicotine dependence: Secondary | ICD-10-CM | POA: Insufficient documentation

## 2023-02-04 DIAGNOSIS — I5042 Chronic combined systolic (congestive) and diastolic (congestive) heart failure: Secondary | ICD-10-CM | POA: Diagnosis not present

## 2023-02-04 DIAGNOSIS — J9611 Chronic respiratory failure with hypoxia: Secondary | ICD-10-CM | POA: Diagnosis not present

## 2023-02-04 DIAGNOSIS — Z6837 Body mass index (BMI) 37.0-37.9, adult: Secondary | ICD-10-CM | POA: Insufficient documentation

## 2023-02-04 DIAGNOSIS — R9431 Abnormal electrocardiogram [ECG] [EKG]: Secondary | ICD-10-CM | POA: Diagnosis not present

## 2023-02-04 DIAGNOSIS — I5022 Chronic systolic (congestive) heart failure: Secondary | ICD-10-CM | POA: Diagnosis not present

## 2023-02-04 DIAGNOSIS — J8489 Other specified interstitial pulmonary diseases: Secondary | ICD-10-CM | POA: Diagnosis not present

## 2023-02-04 DIAGNOSIS — I130 Hypertensive heart and chronic kidney disease with heart failure and stage 1 through stage 4 chronic kidney disease, or unspecified chronic kidney disease: Secondary | ICD-10-CM | POA: Diagnosis not present

## 2023-02-04 DIAGNOSIS — I4811 Longstanding persistent atrial fibrillation: Secondary | ICD-10-CM | POA: Diagnosis not present

## 2023-02-04 DIAGNOSIS — Z9981 Dependence on supplemental oxygen: Secondary | ICD-10-CM | POA: Diagnosis not present

## 2023-02-04 DIAGNOSIS — G4733 Obstructive sleep apnea (adult) (pediatric): Secondary | ICD-10-CM | POA: Diagnosis not present

## 2023-02-04 DIAGNOSIS — I4819 Other persistent atrial fibrillation: Secondary | ICD-10-CM | POA: Diagnosis present

## 2023-02-04 MED ORDER — METOLAZONE 2.5 MG PO TABS: 2.5000 mg | ORAL_TABLET | ORAL | 0 refills | Status: DC | PRN

## 2023-02-04 NOTE — Progress Notes (Addendum)
ADVANCED HF CLINIC NOTE   Primary Care: [name], MD Primary Cardiologist: Dr. Jeralyn Ruths ([facility]) HF Cardiologist: Dr. Gala Romney  HPI: Kathleen [name] is a 77 y.o. 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 Recently moved from [place] to [place] and referred by Dr. Jeralyn Ruths to establish cardiology care.   Previous echo with EF 40% but EF recovered. Echo ([facility]) 8/22 EF > 55% RV normal   She has been followed by Dr. Wilford Grist and Jeralyn Ruths at [facility]. Has maintained NSR on Tikosyn.   RHC + LHC (01/17/20): RA 12, PA mean 29, PCWP 13, CI 2.2, Coronary angiogram without any stenosis  RHC (02/08/20): RA 8, PAM 24, wedge 16, CI 2.5  Now followed by Dr. Francine Graven in Pulmonary for her BOOP.    Saw [place] in HF Clinic for the fist time in 9/22.  Zio 11/22: Predominantly AF with v-pacing with periods of sinus rhythm with V-pacing - avg HR of 75 bpm. 2. 2. Rare PACs and PVCs.   Follow up 1/23 stable NYHA III, volume OK on torsemide 2x/week and PRN.  Seen in AF clinic 3/23, continued on dofetilide 250 bid.  Echo 4/24 EF 35-40% RV ok Personally reviewed  Admitted 8/7-13/2024 with COPD flare.   Today she returns for HF follow up. Very SOB. + LE edema. Drinking a bunch of fluids. Feels bloated. Compliant with meds. Wears O2   Cardiac Studies  - Echo 10/23 and 11/23 EF 35-40% (Dr. Gala Romney felt 40-45%). Having episodes of CP that can wake her up from sleep. No exertional CP.   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     azelastine (ASTELIN) 0.1 % nasal spray Place 2 sprays into both nostrils 2 (two) times daily. 90 mL 0   chlorpheniramine-HYDROcodone (TUSSIONEX) 10-8 MG/5ML Take 5 mLs by mouth at bedtime as needed for cough. 115 mL 0   Cholecalciferol (VITAMIN D3) 50 MCG (2000 UT) TABS Take 2,000 Units by mouth at bedtime.     cyanocobalamin (VITAMIN B12) 1000 MCG/ML injection Inject 1 mL (1,000 mcg total) into the muscle every 30 (thirty) days. 10 mL 0   docusate sodium (COLACE) 100 MG capsule Take 100 mg by mouth 2 (two) times daily.     dofetilide (TIKOSYN) 250 MCG capsule Take 1 capsule (250 mcg total) by mouth in the morning and at bedtime. 60 capsule 6   febuxostat (ULORIC) 40 MG tablet TAKE 1 TABLET(40 MG) BY MOUTH DAILY 90 tablet 0   fluticasone furoate-vilanterol (BREO ELLIPTA) 100-25 MCG/ACT AEPB Inhale 1 puff into the lungs daily. 30 each 5  gabapentin (NEURONTIN) 100 MG capsule Take 2 capsules (200 mg total) by mouth at bedtime. 60 capsule 6   guaifenesin (HUMIBID E) 400 MG TABS tablet Take 1 tablet (400 mg total) by mouth every 4 (four) hours as needed. 60 tablet 0   levalbuterol (XOPENEX) 0.63 MG/3ML nebulizer solution Take 3 mLs (0.63 mg total) by nebulization every 6 (six) hours as needed for wheezing or shortness of breath. DX J44.89 J96.21 3 mL 12   losartan (COZAAR) 25 MG tablet TAKE 1 TABLET(25 MG) BY MOUTH AT BEDTIME 90 tablet 3   Magnesium 500 MG TABS Take 500 mg by mouth at bedtime.     metolazone (ZAROXOLYN) 2.5 MG tablet Take 1 tablet (2.5 mg total) by mouth as needed. MUST TAKE OF POTASSIUM WITH THIS 10 tablet 0   NEXLETOL  180 MG TABS TAKE 1 TABLET BY MOUTH DAILY 90 tablet 0   OXYGEN Inhale 5 L into the lungs continuous. Use with resmed ventilator     potassium chloride (KLOR-CON) 10 MEQ tablet Take 1 tablet (10 mEq total) by mouth daily. 90 tablet 3   rivaroxaban (XARELTO) 20 MG TABS tablet Take 1 tablet (20 mg total) by mouth daily with supper. 30 tablet 5   spironolactone (ALDACTONE) 25 MG tablet TAKE 1 TABLET(25 MG) BY MOUTH EVERY MORNING 90 tablet 3   torsemide (DEMADEX) 20 MG tablet Take 1 tablet (20 mg total) by mouth daily. 90 tablet 2   No current facility-administered medications for this encounter.   Allergies  Allergen Reactions   Allopurinol Other (See Comments)    Reaction:  Dizziness    Clindamycin Anaphylaxis and Hives   Flublok [Influenza Vaccine Recombinant] Other (See Comments)    Fever 103 with no alternative explanation day after vaccine.  Clydie Braun Highfill FNP-C   Pneumococcal 13-Val Conj Vacc Itching, Swelling and Rash   Dronedarone Rash   Brovana [Arformoterol]     Caused muscle pain   Budesonide     Caused extreme joint pain   Entresto [Sacubitril-Valsartan] Other (See Comments)    hypotension   Fosamax [Alendronate Sodium] Nausea Only   Jardiance [Empagliflozin]     Caused a vaginal infection   Meperidine Nausea And Vomiting   Microplegia Msa-Msg [Plegisol]     Loopy,diarrhea   Rosuvastatin Other (See Comments)    Reaction:  Muscle spasms    Tetracycline Hives   Adhesive [Tape] Rash   Lovastatin Rash and Other (See Comments)    Muscle Pain   Social History   Socioeconomic History   Marital status: Widowed    Spouse name: Not on file   Number of children: 1   Years of education: Not on file   Highest education level: Not on file  Occupational History   Occupation: retired  Tobacco Use   Smoking status: Former    Current packs/day: 0.00    Average packs/day: 1 pack/day for 20.0 years (20.0 ttl pk-yrs)    Types: Cigarettes    Start date: 06/09/1967    Quit date:  06/09/1987    Years since quitting: 35.6    Passive exposure: Past   Smokeless tobacco: Never   Tobacco comments:    Former smoker 12/25/21  Vaping Use   Vaping status: Never Used  Substance and Sexual Activity   Alcohol use: No    Alcohol/week: 0.0 standard drinks of alcohol   Drug use: No   Sexual activity: Not Currently  Other Topics Concern   Not on file  Social History  Narrative   Pt lives in [place] alone.  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  Her son works in [employer].   Social Determinants of Health   Financial Resource Strain: Low Risk  (05/21/2022)   Overall Financial Resource Strain (CARDIA)    Difficulty of Paying Living Expenses: Not very hard  Food Insecurity: No Food Insecurity (01/20/2023)   Hunger Vital Sign    Worried About Running Out of Food in the Last Year: Never true    Ran Out of Food in the Last Year: Never true  Transportation Needs: No Transportation Needs (01/20/2023)   PRAPARE - Administrator, Civil Service (Medical): No    Lack of Transportation (Non-Medical): No  Physical Activity: Insufficiently Active (05/21/2022)   Exercise Vital Sign    Days of Exercise per Week: 2 days    Minutes of Exercise per Session: 20 min  Stress: No Stress Concern Present (05/21/2022)   [employer] of Occupational Health - Occupational Stress Questionnaire    Feeling of Stress : Only a little  Social Connections: Moderately Isolated (05/21/2022)   Social Connection and Isolation Panel [NHANES]    Frequency of Communication with Friends and Family: More than three times a week    Frequency of Social Gatherings with Friends and Family: More than three times a week    Attends Religious Services: Never    Database administrator or Organizations: Yes    Attends Engineer, structural: More than 4 times per year    Marital Status: Widowed  Intimate Partner Violence: Not At Risk (01/14/2023)   Humiliation, Afraid, Rape, and Kick  questionnaire    Fear of Current or Ex-Partner: No    Emotionally Abused: No    Physically Abused: No    Sexually Abused: No   Family History  Problem Relation Age of Onset   Breast cancer Mother 14       3 different times   Pancreatic cancer Mother    Emphysema Father    Healthy Brother    Breast cancer Cousin    Valvular heart disease Son    Obesity Daughter    Colon cancer Neg Hx    Esophageal cancer Neg Hx    Stomach cancer Neg Hx    Inflammatory bowel disease Neg Hx    Liver disease Neg Hx    Rectal cancer Neg Hx    BP (!) 136/0   Pulse 78   Wt 88 kg (194 lb)   SpO2 97% Comment: 5 l n/c  BMI 37.89 kg/m   Wt Readings from Last 3 Encounters:  02/04/23 88 kg (194 lb)  01/27/23 87.1 kg (192 lb)  01/20/23 86.6 kg (191 lb)   PHYSICAL EXAM: General:  Chronically-ill appearing obese woman. Wearing O2 HEENT: normal Neck: supple. JVP 7  Carotids 2+ bilat; no bruits. No lymphadenopathy or thryomegaly appreciated. Cor: PMI nondisplaced. Regular rate & rhythm. No rubs, gallops or murmurs. Lungs: minimal basilar crackles Abdomen: obese soft, nontender, nondistended. No hepatosplenomegaly. No bruits or masses. Good bowel sounds. Extremities: no cyanosis, clubbing, rash, tr edema Neuro: alert & orientedx3, cranial nerves grossly intact. moves all 4 extremities w/o difficulty. Affect pleasant   Device interrogation (personally reviewed): No VT. Optivol ok. Activitly level < 1hr/day Personally reviewed  ReDS 33%  ECG: AS-VP. Personally reviewed  ASSESSMENT & PLAN:   1. Chronic systolic/diastolic HF - Echo (0/27): EF 25%. RV ok  - Echo (10/23) and (11/23) EF  35-40% (I thought 40-45%) - Echo 4/24 EF 35-40% RV ok Personally reviewed - R/L cath (10/23):  minimal CAD.  - Chronic NYHA IIIB, mostly due to lung disease and obesity. Volume status doesn't look too bad. OK on Optivol and ReDS. Given midl weight gain wil give one dose metolazone 2.5 + 40 KCL. Can also use prn.  Warned her not to overuse it.  - Continue torsemide 20 mg daily/10 KCL daily. - Continue losartan 25 mg qhs (failed Entresto due to low BP) - Continue spiro 25 mg daily. - No b-blocker with recent low BPs and severe lung disease. - No Jardiance with frequent UTIs. - Encouraged compliance with Cardiomems but pillow is not working. Will work on getting her a new pillow - Labs today.  2. 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 Francine Graven) - Followed by Dr. Vassie Loll - Sats look good  3. Persistent AF - s/p AVN ablation and PM (failed upgrade to BiV) - maintaining NSR on Tikosyn (Zio showed significant AF burden).  - Followed by EP/AF Clinic - Continue Xarelto. No bleeding  4. Morbid obesity  - Body mass index is 37.89 kg/m. - Continue weight loss efforts  5. OSA - Continue Bipap   Kathleen Meres, MD  10:49 AM

## 2023-02-04 NOTE — Progress Notes (Signed)
ReDS Vest / Clip - 02/04/23 1600       ReDS Vest / Clip   Station Marker A    Ruler Value 30    ReDS Value Range Low volume    ReDS Actual Value 33

## 2023-02-04 NOTE — Patient Instructions (Signed)
Medication Changes:  TAKE METOLAZONE 2.5 MG IN THE MORNING   TAKE POTASSIUM WITH THE METOLAZONE   DO NOT TAKE THESE EVERYDAY. ONLY USE THESE SPRARINGLY IF YOU HAVE ISSUES PLEASE CALL OUR CLINIC TO DISCUSS   Follow-Up in: 6 MONTHS. PLEASE CALL OUR OFFICE AROUND DECEMBER TO GET SCHEDULED FOR YOUR APPOINTMENT. PHONE NUMBER IS [phone] OPTION 2    At the [facility], you and your health needs are our priority. We have a designated team specialized in the treatment of Heart Failure. This Care Team includes your primary Heart Failure Specialized Cardiologist (physician), Advanced Practice Providers (APPs- Physician Assistants and Nurse Practitioners), and Pharmacist who all work together to provide you with the care you need, when you need it.   You may see any of the following providers on your designated Care Team at your next follow up:  Dr. Arvilla Meres Dr. Marca Ancona Dr. Marcos Eke, NP Robbie Lis, [place] [facility] [place], [place] Brynda Peon, NP Karle Plumber, PharmD   Please be sure to bring in all your medications bottles to every appointment.   Need to Contact us:  If you have any questions or concerns before your next appointment please send [place] a message through [place] or call our office at [phone].    TO LEAVE A MESSAGE FOR THE NURSE SELECT OPTION 2, PLEASE LEAVE A MESSAGE INCLUDING: YOUR NAME DATE OF BIRTH CALL BACK NUMBER REASON FOR CALL**this is important as we prioritize the call backs  YOU WILL RECEIVE A CALL BACK THE SAME DAY AS LONG AS YOU CALL BEFORE 4:00 PM

## 2023-02-05 ENCOUNTER — Encounter (HOSPITAL_COMMUNITY): Payer: Self-pay | Admitting: Internal Medicine

## 2023-02-05 MED ORDER — METOLAZONE 2.5 MG PO TABS: 2.5000 mg | ORAL_TABLET | ORAL | 0 refills | Status: DC | PRN

## 2023-02-05 NOTE — Telephone Encounter (Signed)
Will forward to provider for additional input if needed.   Also note-pharmacy requires updated script for PRN instructions

## 2023-02-05 NOTE — Telephone Encounter (Signed)
Pt aware.

## 2023-02-09 ENCOUNTER — Ambulatory Visit (INDEPENDENT_AMBULATORY_CARE_PROVIDER_SITE_OTHER): Payer: Medicare Other | Admitting: Physical Medicine and Rehabilitation

## 2023-02-09 DIAGNOSIS — M25512 Pain in left shoulder: Secondary | ICD-10-CM

## 2023-02-09 DIAGNOSIS — R531 Weakness: Secondary | ICD-10-CM

## 2023-02-09 DIAGNOSIS — M79641 Pain in right hand: Secondary | ICD-10-CM

## 2023-02-09 DIAGNOSIS — M25511 Pain in right shoulder: Secondary | ICD-10-CM | POA: Diagnosis not present

## 2023-02-09 DIAGNOSIS — R202 Paresthesia of skin: Secondary | ICD-10-CM | POA: Diagnosis not present

## 2023-02-09 DIAGNOSIS — G8929 Other chronic pain: Secondary | ICD-10-CM

## 2023-02-09 DIAGNOSIS — M79642 Pain in left hand: Secondary | ICD-10-CM

## 2023-02-09 NOTE — Progress Notes (Signed)
Functional Pain Scale - descriptive words and definitions  Distressing (6)    Pain is present/unable to complete most ADLs limited by pain/sleep is difficult and active distraction is only marginal. Moderate range order  Average Pain 5  BUE NCS- pain and numbness. Has difficultly grasping and dropping things. Has increased pain in left hand. Pain in left palm. Going to PT.

## 2023-02-10 ENCOUNTER — Encounter: Payer: Self-pay | Admitting: Physical Medicine and Rehabilitation

## 2023-02-10 ENCOUNTER — Encounter (HOSPITAL_COMMUNITY): Payer: Self-pay | Admitting: Internal Medicine

## 2023-02-10 NOTE — Procedures (Signed)
EMG & NCV Findings: Evaluation of the left median motor nerve showed decreased conduction velocity (Elbow-Wrist, 44 m/s).  The right median motor nerve showed prolonged distal onset latency (4.3 ms) and decreased conduction velocity (Elbow-Wrist, 41 m/s).  The left median (across palm) sensory nerve showed prolonged distal peak latency (Wrist, 4.8 ms) and prolonged distal peak latency (Palm, 2.5 ms).  The right median (across palm) sensory nerve showed no response (Palm) and prolonged distal peak latency (4.8 ms).  The left ulnar sensory nerve showed prolonged distal peak latency (4.5 ms), reduced amplitude (2.7 V), and decreased conduction velocity (Wrist-5th Digit, 31 m/s).  The right ulnar sensory nerve showed prolonged distal peak latency (3.9 ms) and decreased conduction velocity (Wrist-5th Digit, 36 m/s).  All remaining nerves (as indicated in the following tables) were within normal limits.  Left vs. Right side comparison data for the ulnar motor nerve indicates abnormal L-R velocity difference (A Elbow-B Elbow, 24 m/s).  The ulnar sensory nerve indicates abnormal L-R latency difference (0.6 ms) and abnormal L-R amplitude difference (84.8 %).  All remaining left vs. right side differences were within normal limits.    All examined muscles (as indicated in the following table) showed no evidence of electrical instability.    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  Clinical correlation with her symptoms is paramount as she likely has some osteoarthritic pain as well.  There is no significant electrodiagnostic evidence of any other focal nerve entrapment, brachial plexopathy or cervical radiculopathy.   Recommendations: 1.  Follow-up with referring physician. 2.  Continue current management of symptoms. 3.  Continue use of resting  splint at night-time and as needed during the day. 4.  Suggest diagnostic and therapeutic carpal tunnel injection or even hydrodissection which Dr. Shon Baton in our office can do.  Consider surgical evaluation.  ___________________________ Kathleen [name] FAAPMR Board Certified, American Board of Physical Medicine and Rehabilitation    Nerve Conduction Studies Anti Sensory Summary Table   Stim Site NR Peak (ms) Norm Peak (ms) P-T Amp (V) Norm P-T Amp Site1 Site2 Delta-P (ms) Dist (cm) Vel (m/s) Norm Vel (m/s)  Left Median Acr Palm Anti Sensory (2nd Digit)  30.6C  Wrist    *4.8 <3.6 23.3 >10 Wrist Palm 2.3 0.0    Palm    *2.5 <2.0 7.4         Right Median Acr Palm Anti Sensory (2nd Digit)  31.7C  Wrist    *4.8 <3.6 16.3 >10 Wrist Palm  0.0    Palm *NR  <2.0          Left Radial Anti Sensory (Base 1st Digit)  31.3C  Wrist    2.6 <3.1 17.4  Wrist Base 1st Digit 2.6 0.0    Right Radial Anti Sensory (Base 1st Digit)  31.8C  Wrist    2.9 <3.1 8.1  Wrist Base 1st Digit 2.9 0.0    Left Ulnar Anti Sensory (5th Digit)  31.5C  Wrist    *4.5 <3.7 *2.7 >15.0 Wrist 5th Digit 4.5 14.0 *31 >38  Right Ulnar Anti Sensory (5th Digit)  32C  Wrist    *3.9 <3.7 17.8 >15.0 Wrist 5th Digit 3.9 14.0 *36 >38   Motor Summary Table   Stim Site NR Onset (ms) Norm Onset (ms) O-P Amp (mV) Norm O-P Amp Site1 Site2 Delta-0 (ms) Dist (cm) Vel (m/s) Norm Vel (  m/s)  Left Median Motor (Abd Poll Brev)  31.4C  Wrist    4.2 <4.2 6.6 >5 Elbow Wrist 4.3 19.0 *44 >50  Elbow    8.5  5.7         Right Median Motor (Abd Poll Brev)  31.9C  Wrist    *4.3 <4.2 6.8 >5 Elbow Wrist 4.8 19.5 *41 >50  Elbow    9.1  4.1         Left Ulnar Motor (Abd Dig Min)  31.7C  Wrist    2.9 <4.2 6.5 >3 B Elbow Wrist 3.1 17.0 55 >53  B Elbow    6.0  9.9  A Elbow B Elbow 1.7 10.0 59 >53  A Elbow    7.7  3.4         Right Ulnar Motor (Abd Dig Min)  32.3C  Wrist    2.9 <4.2 6.7 >3 B Elbow Wrist 3.4 19.0 56 >53  B Elbow    6.3  5.2  A Elbow  B Elbow 1.2 10.0 83 >53  A Elbow    7.5  1.8          EMG   Side Muscle Nerve Root Ins Act Fibs Psw Amp Dur Poly Recrt Int Dennie Bible Comment  Right Abd Poll Brev Median C8-T1 Nml Nml Nml Nml Nml 0 Nml Nml   Right 1stDorInt Ulnar C8-T1 Nml Nml Nml Nml Nml 0 Nml Nml   Right PronatorTeres Median C6-7 Nml Nml Nml Nml Nml 0 Nml Nml   Right Biceps Musculocut C5-6 Nml Nml Nml Nml Nml 0 Nml Nml   Right Deltoid Axillary C5-6 Nml Nml Nml Nml Nml 0 Nml Nml     Nerve Conduction Studies Anti Sensory Left/Right Comparison   Stim Site L Lat (ms) R Lat (ms) L-R Lat (ms) L Amp (V) R Amp (V) L-R Amp (%) Site1 Site2 L Vel (m/s) R Vel (m/s) L-R Vel (m/s)  Median Acr Palm Anti Sensory (2nd Digit)  30.6C  Wrist *4.8 *4.8 0.0 23.3 16.3 30.0 Wrist Palm     Palm *2.5   7.4         Radial Anti Sensory (Base 1st Digit)  31.3C  Wrist 2.6 2.9 0.3 17.4 8.1 53.4 Wrist Base 1st Digit     Ulnar Anti Sensory (5th Digit)  31.5C  Wrist *4.5 *3.9 *0.6 *2.7 17.8 *84.8 Wrist 5th Digit *31 *36 5   Motor Left/Right Comparison   Stim Site L Lat (ms) R Lat (ms) L-R Lat (ms) L Amp (mV) R Amp (mV) L-R Amp (%) Site1 Site2 L Vel (m/s) R Vel (m/s) L-R Vel (m/s)  Median Motor (Abd Poll Brev)  31.4C  Wrist 4.2 *4.3 0.1 6.6 6.8 2.9 Elbow Wrist *44 *41 3  Elbow 8.5 9.1 0.6 5.7 4.1 28.1       Ulnar Motor (Abd Dig Min)  31.7C  Wrist 2.9 2.9 0.0 6.5 6.7 3.0 B Elbow Wrist 55 56 1  B Elbow 6.0 6.3 0.3 9.9 5.2 47.5 A Elbow B Elbow 59 83 *24  A Elbow 7.7 7.5 0.2 3.4 1.8 47.1          Waveforms:

## 2023-02-12 ENCOUNTER — Ambulatory Visit (INDEPENDENT_AMBULATORY_CARE_PROVIDER_SITE_OTHER): Payer: Medicare Other

## 2023-02-12 ENCOUNTER — Ambulatory Visit (INDEPENDENT_AMBULATORY_CARE_PROVIDER_SITE_OTHER): Payer: Medicare Other | Admitting: Adult Health

## 2023-02-12 ENCOUNTER — Encounter: Payer: Self-pay | Admitting: Adult Health

## 2023-02-12 VITALS — BP 100/70 | HR 91 | Temp 98.2°F | Ht 60.0 in | Wt 191.6 lb

## 2023-02-12 DIAGNOSIS — I5022 Chronic systolic (congestive) heart failure: Secondary | ICD-10-CM | POA: Diagnosis not present

## 2023-02-12 DIAGNOSIS — J441 Chronic obstructive pulmonary disease with (acute) exacerbation: Secondary | ICD-10-CM | POA: Diagnosis not present

## 2023-02-12 DIAGNOSIS — J398 Other specified diseases of upper respiratory tract: Secondary | ICD-10-CM | POA: Diagnosis not present

## 2023-02-12 DIAGNOSIS — G4730 Sleep apnea, unspecified: Secondary | ICD-10-CM

## 2023-02-12 DIAGNOSIS — J9611 Chronic respiratory failure with hypoxia: Secondary | ICD-10-CM

## 2023-02-12 MED ORDER — BENZONATATE 200 MG PO CAPS: 200.0000 mg | ORAL_CAPSULE | Freq: Three times a day (TID) | ORAL | 3 refills | Status: DC | PRN

## 2023-02-12 MED ORDER — AMOXICILLIN-POT CLAVULANATE 875-125 MG PO TABS: 1.0000 | ORAL_TABLET | Freq: Two times a day (BID) | ORAL | 0 refills | Status: DC

## 2023-02-12 MED ORDER — PREDNISONE 20 MG PO TABS: 20.0000 mg | ORAL_TABLET | Freq: Every day | ORAL | 0 refills | Status: DC

## 2023-02-12 NOTE — Assessment & Plan Note (Signed)
Continue on oxygen to maintain O2 saturation greater than 88 to 9%.  No increased oxygen demands.

## 2023-02-12 NOTE — Assessment & Plan Note (Signed)
Appears euvolemic on exam.  Continue follow-up with cardiology 

## 2023-02-12 NOTE — Assessment & Plan Note (Signed)
BiPAP download has been requested.  Continue on BiPAP at bedtime.  Patient is encouraged on compliance

## 2023-02-12 NOTE — Progress Notes (Signed)
@Patient  ID: Kathleen [name], female    DOB: 03-02-1946, 77 y.o.   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 Renaee Munda at [facility] interventional pulmonary and felt not to be a candidate for stent placement for tracheobronchomalacia  Medical history significant for CHF   TEST/EVENTS :   02/12/2023 Acute OV ; Cough  Patient presents for an acute office visit.  She complains of 1 week history of increased cough, congestion, thick mucus, barking cough and intermittent wheezing.  Complains of fever and sweats.  Appetite is good with no nausea vomiting or diarrhea.  She remains on Breo inhaler daily.  She uses Yupelri nebulizer once daily.  Uses Xopenex on occasion.  Patient is followed by cardiology for congestive heart failure.  She denies any increased orthopnea or leg swelling.  She has checked for COVID at home x 2 and of both home test have been negative She remains on oxygen 4 L.  No increased oxygen demands. She has BiPAP but has not been wearing it.    Allergies  Allergen Reactions   Allopurinol Other (See Comments)    Reaction:  Dizziness    Clindamycin Anaphylaxis and Hives   Flublok [Influenza Vaccine Recombinant] Other (See Comments)    Fever 103 with no alternative explanation day after vaccine.  Clydie Braun Highfill FNP-C   Pneumococcal 13-Val Conj Vacc Itching, Swelling and Rash   Dronedarone Rash   Montelukast Other (See Comments)    Makes her loopy.   Brovana [Arformoterol]     Caused muscle pain   Budesonide     Caused extreme joint pain   Entresto [Sacubitril-Valsartan] Other (See Comments)    hypotension   Fosamax [Alendronate Sodium] Nausea Only   Jardiance [Empagliflozin]     Caused a vaginal infection   Meperidine Nausea  And Vomiting   Microplegia Msa-Msg [Plegisol]     Loopy,diarrhea   Rosuvastatin Other (See Comments)    Reaction:  Muscle spasms    Tetracycline Hives   Adhesive [Tape] Rash   Lovastatin Rash and Other (See Comments)    Muscle Pain    Immunization History  Administered Date(s) Administered   Fluad Quad(high Dose 65+) 04/22/2021, 03/16/2022   Hepatitis A, Adult 04/12/2001   Influenza, High Dose Seasonal PF 03/16/2019   PFIZER Comirnaty(Gray Top)Covid-19 Tri-Sucrose Vaccine 03/29/2020, 10/09/2020   PFIZER(Purple Top)SARS-COV-2 Vaccination 07/13/2019, 08/08/2019   Pneumococcal Conjugate-13 08/30/2014   Pneumococcal Polysaccharide-23 03/31/2019   Tdap 10/02/2013   Yellow Fever 04/12/2001    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    Concussion 10/04/2021   COPD (chronic obstructive pulmonary disease) (HCC)    Gout    Hypertension    Longstanding persistent atrial fibrillation (HCC)    on Xarelto   Nonischemic cardiomyopathy (HCC)    Obesity    Pacemaker    Spontaneous pneumothorax 2013    Tobacco History: Social History   Tobacco Use  Smoking Status Former   Current packs/day: 0.00   Average packs/day: 1 pack/day for 20.0 years (20.0 ttl pk-yrs)   Types: Cigarettes  Start date: 06/09/1967   Quit date: 06/09/1987   Years since quitting: 35.7   Passive exposure: Past  Smokeless Tobacco Never  Tobacco Comments   Former smoker 12/25/21   Counseling given: Not Answered Tobacco comments: Former smoker 12/25/21   Outpatient Medications Prior to Visit  Medication Sig Dispense Refill   acetaminophen (TYLENOL) 650 MG CR tablet Take 1,300 mg by mouth every 8 (eight) hours as needed for pain.     azelastine (ASTELIN) 0.1 % nasal spray Place 2 sprays into both nostrils 2 (two)  times daily. 90 mL 0   chlorpheniramine-HYDROcodone (TUSSIONEX) 10-8 MG/5ML Take 5 mLs by mouth at bedtime as needed for cough. 115 mL 0   Cholecalciferol (VITAMIN D3) 50 MCG (2000 UT) TABS Take 2,000 Units by mouth at bedtime.     cyanocobalamin (VITAMIN B12) 1000 MCG/ML injection Inject 1 mL (1,000 mcg total) into the muscle every 30 (thirty) days. 10 mL 0   docusate sodium (COLACE) 100 MG capsule Take 100 mg by mouth 2 (two) times daily.     dofetilide (TIKOSYN) 250 MCG capsule Take 1 capsule (250 mcg total) by mouth in the morning and at bedtime. 60 capsule 6   febuxostat (ULORIC) 40 MG tablet TAKE 1 TABLET(40 MG) BY MOUTH DAILY 90 tablet 0   fluticasone furoate-vilanterol (BREO ELLIPTA) 100-25 MCG/ACT AEPB Inhale 1 puff into the lungs daily. 30 each 5   gabapentin (NEURONTIN) 100 MG capsule Take 2 capsules (200 mg total) by mouth at bedtime. 60 capsule 6   guaifenesin (HUMIBID E) 400 MG TABS tablet Take 1 tablet (400 mg total) by mouth every 4 (four) hours as needed. 60 tablet 0   levalbuterol (XOPENEX) 0.63 MG/3ML nebulizer solution Take 3 mLs (0.63 mg total) by nebulization every 6 (six) hours as needed for wheezing or shortness of breath. DX J44.89 J96.21 3 mL 12   losartan (COZAAR) 25 MG tablet TAKE 1 TABLET(25 MG) BY MOUTH AT BEDTIME 90 tablet 3   Magnesium 500 MG TABS Take 500 mg by mouth at bedtime.     metolazone (ZAROXOLYN) 2.5 MG tablet Take 1 tablet (2.5 mg total) by mouth as needed (for swelling or SOB). MUST TAKE OF POTASSIUM WITH THIS 10 tablet 0   NEXLETOL 180 MG TABS TAKE 1 TABLET BY MOUTH DAILY 90 tablet 0   OXYGEN Inhale 5 L into the lungs continuous. Use with resmed ventilator     potassium chloride (KLOR-CON) 10 MEQ tablet Take 1 tablet (10 mEq total) by mouth daily. 90 tablet 3   rivaroxaban (XARELTO) 20 MG TABS tablet Take 1 tablet (20 mg total) by mouth daily with supper. 30 tablet 5   spironolactone (ALDACTONE) 25 MG tablet TAKE 1 TABLET(25 MG) BY MOUTH EVERY  MORNING 90 tablet 3   torsemide (DEMADEX) 20 MG tablet Take 1 tablet (20 mg total) by mouth daily. 90 tablet 2   No facility-administered medications prior to visit.     Review of Systems:   Constitutional:   No  weight loss, night sweats,  + Fevers, chills, fatigue, or  lassitude.  HEENT:   No headaches,  Difficulty swallowing,  Tooth/dental problems, or  Sore throat,                No sneezing, itching, ear ache, nasal congestion, post nasal drip,   CV:  No chest pain,  Orthopnea, PND, swelling in lower extremities, anasarca, dizziness, palpitations, syncope.   GI  No heartburn, indigestion, abdominal pain, nausea, vomiting,  diarrhea, change in bowel habits, loss of appetite, bloody stools.   Resp:   No chest wall deformity  Skin: no rash or lesions.  GU: no dysuria, change in color of urine, no urgency or frequency.  No flank pain, no hematuria   MS:  No joint pain or swelling.  No decreased range of motion.  No back pain.    Physical Exam  BP 100/70 (BP Location: Right Wrist, Patient Position: Sitting, Cuff Size: Normal)   Pulse 91   Temp 98.2 F (36.8 C) (Oral)   Ht 5' (1.524 m)   Wt 191 lb 9.6 oz (86.9 kg)   SpO2 91%   BMI 37.42 kg/m   GEN: A/Ox3; pleasant , NAD, well nourished , on O2    HEENT:  [place]/AT,  EACs-clear, TMs-wnl, NOSE-clear, THROAT-clear, no lesions, no postnasal drip or exudate noted.   NECK:  Supple w/ fair ROM; no JVD; normal carotid impulses w/o bruits; no thyromegaly or nodules palpated; no lymphadenopathy.    RESP coarse rhonchi bilaterally  no accessory muscle use, no dullness to percussion  CARD:  RRR, no m/r/g, no peripheral edema, pulses intact, no cyanosis or clubbing.  GI:   Soft & nt; nml bowel sounds; no organomegaly or masses detected.   Musco: Warm bil, no deformities or joint swelling noted.   Neuro: alert, no focal deficits noted.    Skin: Warm, no lesions or rashes    Lab Results:  CBC    Component Value Date/Time    WBC 11.3 (H) 01/14/2023 0659   RBC 3.72 (L) 01/14/2023 0659   HGB 10.4 (L) 01/14/2023 0659   HGB 13.4 12/28/2012 0748   HCT 32.6 (L) 01/14/2023 0659   HCT 39.2 12/28/2012 0748   PLT 254 01/14/2023 0659   PLT 186 12/28/2012 0748   MCV 87.6 01/14/2023 0659   MCV 87 12/28/2012 0748   MCH 28.0 01/14/2023 0659   MCHC 31.9 01/14/2023 0659   RDW 14.6 01/14/2023 0659   RDW 15.0 (H) 12/28/2012 0748   LYMPHSABS 0.6 (L) 01/13/2023 1718   LYMPHSABS 0.4 (L) 12/28/2012 0748   MONOABS 0.4 01/13/2023 1718   MONOABS 0.5 12/28/2012 0748   EOSABS 0.0 01/13/2023 1718   EOSABS 0.0 12/28/2012 0748   BASOSABS 0.1 01/13/2023 1718   BASOSABS 0.0 12/28/2012 0748    BMET    Component Value Date/Time   NA 139 01/27/2023 1137   NA 130 (L) 12/28/2012 0748   K 4.2 01/27/2023 1137   K 4.6 12/28/2012 0748   CL 96 (L) 01/27/2023 1137   CL 95 (L) 12/28/2012 0748   CO2 32 01/27/2023 1137   CO2 23 12/28/2012 0748   GLUCOSE 128 (H) 01/27/2023 1137   GLUCOSE 391 (H) 12/28/2012 0748   BUN 32 (H) 01/27/2023 1137   BUN 32 (H) 12/28/2012 0748   CREATININE 0.86 01/27/2023 1137   CALCIUM 10.4 01/27/2023 1137   CALCIUM 14.3 (HH) 06/26/2019 1618   GFRNONAA >60 01/16/2023 0801   GFRNONAA 43 (L) 12/28/2012 0748   GFRAA 58 (L) 06/28/2019 0427   GFRAA 50 (L) 12/28/2012 0748    BNP    Component Value Date/Time   BNP 129.5 (H) 01/13/2023 1718    ProBNP No results found for: "PROBNP"  Imaging: DG CHEST PORT 1 VIEW  Result Date: 01/16/2023 CLINICAL DATA:  Cough EXAM: PORTABLE CHEST 1 VIEW COMPARISON:  01/13/2023 FINDINGS: Left-sided implanted cardiac device remains in place. Stable cardiomegaly. Aortic atherosclerosis. Streaky bibasilar interstitial opacities. Possible small left  pleural effusion. No pneumothorax. Advanced degenerative changes of both shoulders. IMPRESSION: 1. Streaky bibasilar interstitial opacities, which may represent atelectasis or mild edema. 2. Possible small left pleural effusion.  Electronically Signed   By: Duanne Guess D.O.   On: 01/16/2023 14:10    Administration History     None           No data to display          No results found for: "NITRICOXIDE"      Assessment & Plan:   Acute exacerbation of chronic obstructive pulmonary disease (COPD) (HCC) Acute COPD exacerbation/asthmatic bronchitis complicated by tracheobronchomalacia.  Will treat with empiric antibiotics with Augmentin and burst of steroids.  Check sputum culture. Add in cough control regimen with Delsym and Tessalon Perles. Chest x-ray is pending.  Advise if symptoms or not improving or worsen she needs to seek emergency room care.  Plan  Patient Instructions  Sputum culture  Chest xray today  Restart BIPAP At bedtime and with naps. Can use during daytime if needed  Delsym 2 tsp Twice daily for cough As needed   Tessalon Three times a day  for cough As needed   Augmentin 875mg  Twice daily  for 1 week , take with food.  Prednisone 20mg  daily for 5 days .  Continue on BREO 1 puff daily  Continue on Yupelri neb daily  Xopenex neb As needed   Follow up with Dr. Vassie Loll  with as planned and As needed   Please contact office for sooner follow up if symptoms do not improve or worsen or seek emergency care        Chronic systolic heart failure (HCC) Appears euvolemic on exam.  Continue follow-up with cardiology  Tracheobronchomalacia Encouraged her on cough control regimen.  Patient is encouraged on daily usage of BiPAP.  To use at bedtime and with naps.  Also can use during the daytime if needed.  Sleep apnea BiPAP download has been requested.  Continue on BiPAP at bedtime.  Patient is encouraged on compliance  Chronic respiratory failure with hypoxia (HCC) Continue on oxygen to maintain O2 saturation greater than 88 to 9%.  No increased oxygen demands.     Rubye Oaks, NP 02/12/2023

## 2023-02-12 NOTE — Assessment & Plan Note (Signed)
Acute COPD exacerbation/asthmatic bronchitis complicated by tracheobronchomalacia.  Will treat with empiric antibiotics with Augmentin and burst of steroids.  Check sputum culture. Add in cough control regimen with Delsym and Tessalon Perles. Chest x-ray is pending.  Advise if symptoms or not improving or worsen she needs to seek emergency room care.  Plan  Patient Instructions  Sputum culture  Chest xray today  Restart BIPAP At bedtime and with naps. Can use during daytime if needed  Delsym 2 tsp Twice daily for cough As needed   Tessalon Three times a day  for cough As needed   Augmentin 875mg  Twice daily  for 1 week , take with food.  Prednisone 20mg  daily for 5 days .  Continue on BREO 1 puff daily  Continue on Yupelri neb daily  Xopenex neb As needed   Follow up with Dr. Vassie Loll  with as planned and As needed   Please contact office for sooner follow up if symptoms do not improve or worsen or seek emergency care

## 2023-02-12 NOTE — Assessment & Plan Note (Signed)
Encouraged her on cough control regimen.  Patient is encouraged on daily usage of BiPAP.  To use at bedtime and with naps.  Also can use during the daytime if needed.

## 2023-02-12 NOTE — Patient Instructions (Addendum)
Sputum culture  Chest xray today  Restart BIPAP At bedtime and with naps. Can use during daytime if needed  Delsym 2 tsp Twice daily for cough As needed   Tessalon Three times a day  for cough As needed   Augmentin 875mg  Twice daily  for 1 week , take with food.  Prednisone 20mg  daily for 5 days .  Continue on BREO 1 puff daily  Continue on Yupelri neb daily  Xopenex neb As needed   Follow up with Dr. Vassie Loll  with as planned and As needed   Please contact office for sooner follow up if symptoms do not improve or worsen or seek emergency care

## 2023-02-16 NOTE — Progress Notes (Signed)
I called patient, appt 02/22/2023

## 2023-02-16 NOTE — Progress Notes (Signed)
Kathleen [name] - 77 y.o. female MRN [id number]  Date of birth: 1945-11-30  Office Visit Note: Visit Date: 02/09/2023 PCP: [name], MD Referred by: Fuller Plan, MD  Subjective: Chief Complaint  Patient presents with   Right Hand - Pain, Numbness   Left Hand - Pain, Numbness   HPI: Kathleen [name] is a 77 y.o. female who comes in today at the request of Dr. Sheliah Hatch for evaluation and management of chronic, worsening and severe pain, numbness and tingling in the Bilateral upper extremities.  Patient is Right hand dominant.  She reports years of pain in both hands with more recent worsening of left hand symptoms with pain particular in the palm and wrist.  She does get paresthesias globally.  She has some neck pain at times and has had ongoing issues with both shoulders with chronic shoulder pain and arthritis.  She has been treated for gout by Dr. Dimple Casey and does have a history of diabetes but I cannot find any hemoglobin A1c numbers more current.  Last 1 was actually normal.  She also has a history of statin myopathy.  She has multiple medical issues.  She has not had electrodiagnostic study.  She reports weakness in the hands and dropping objects.  This is been going on for some time without any specific injury.  She rates her pain is quite severe and limiting at this point.   I spent more than 30 minutes speaking face-to-face with the patient with 50% of the time in counseling and discussing coordination of care.     Review of Systems  Musculoskeletal:  Positive for back pain, joint pain and neck pain.  Neurological:  Positive for tingling and focal weakness.  All other systems reviewed and are negative.  Otherwise per HPI.  Assessment & Plan: Visit Diagnoses:    ICD-10-CM   1. Paresthesia of skin  R20.2 NCV with EMG (electromyography)    2. Bilateral hand pain  M79.641    M79.642     3. Chronic pain of both shoulders  M25.511    G89.29    M25.512      4. 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  Clinical correlation with her symptoms is paramount as she likely has some osteoarthritic pain as well.  There is no significant electrodiagnostic evidence of any other focal nerve entrapment, brachial plexopathy or cervical radiculopathy.   Recommendations: 1.  Follow-up with referring physician. 2.  Continue current management of symptoms. 3.  Continue use of resting splint at night-time and as needed during the day. 4.  Suggest diagnostic and therapeutic carpal tunnel injection or even hydrodissection which Dr. Shon Baton in our office can do.  Consider surgical evaluation.  Meds & Orders: No orders of the defined types were placed in this encounter.   Orders Placed This Encounter  Procedures   NCV with EMG (electromyography)    Follow-up: No follow-ups on file.   Procedures: No procedures performed  EMG & NCV Findings: Evaluation of the left median motor nerve showed decreased conduction velocity (Elbow-Wrist, 44 m/s).  The right median motor nerve showed prolonged distal onset latency (4.3 ms) and decreased conduction velocity (Elbow-Wrist, 41 m/s).  The left median (across palm) sensory nerve showed prolonged distal peak latency (Wrist, 4.8 ms) and prolonged  distal peak latency (Palm, 2.5 ms).  The right median (across palm) sensory nerve showed no response (Palm) and prolonged distal peak latency (4.8 ms).  The left ulnar sensory nerve showed prolonged distal peak latency (4.5 ms), reduced amplitude (2.7 V), and decreased conduction velocity (Wrist-5th Digit, 31 m/s).  The right ulnar sensory nerve showed prolonged distal peak latency (3.9 ms) and decreased conduction velocity (Wrist-5th Digit, 36 m/s).  All remaining nerves (as indicated in the  following tables) were within normal limits.  Left vs. Right side comparison data for the ulnar motor nerve indicates abnormal L-R velocity difference (A Elbow-B Elbow, 24 m/s).  The ulnar sensory nerve indicates abnormal L-R latency difference (0.6 ms) and abnormal L-R amplitude difference (84.8 %).  All remaining left vs. right side differences were within normal limits.    All examined muscles (as indicated in the following table) showed no evidence of electrical instability.    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  Clinical correlation with her symptoms is paramount as she likely has some osteoarthritic pain as well.  There is no significant electrodiagnostic evidence of any other focal nerve entrapment, brachial plexopathy or cervical radiculopathy.   Recommendations: 1.  Follow-up with referring physician. 2.  Continue current management of symptoms. 3.  Continue use of resting splint at night-time and as needed during the day. 4.  Suggest diagnostic and therapeutic carpal tunnel injection or even hydrodissection which Dr. Shon Baton in our office can do.  Consider surgical evaluation.  ___________________________ Naaman Plummer FAAPMR Board Certified, American Board of Physical Medicine and Rehabilitation    Nerve Conduction Studies Anti Sensory Summary Table   Stim Site NR Peak (ms) Norm Peak (ms) P-T Amp (V) Norm P-T Amp Site1 Site2 Delta-P (ms) Dist (cm) Vel (m/s) Norm Vel (m/s)  Left Median Acr Palm Anti Sensory (2nd Digit)  30.6C  Wrist    *4.8 <3.6 23.3 >10 Wrist Palm 2.3 0.0    Palm    *2.5 <2.0 7.4         Right Median Acr Palm Anti Sensory (2nd Digit)  31.7C  Wrist    *4.8 <3.6 16.3 >10 Wrist Palm  0.0    Palm *NR  <2.0          Left Radial Anti Sensory (Base 1st Digit)  31.3C  Wrist    2.6 <3.1 17.4  Wrist  Base 1st Digit 2.6 0.0    Right Radial Anti Sensory (Base 1st Digit)  31.8C  Wrist    2.9 <3.1 8.1  Wrist Base 1st Digit 2.9 0.0    Left Ulnar Anti Sensory (5th Digit)  31.5C  Wrist    *4.5 <3.7 *2.7 >15.0 Wrist 5th Digit 4.5 14.0 *31 >38  Right Ulnar Anti Sensory (5th Digit)  32C  Wrist    *3.9 <3.7 17.8 >15.0 Wrist 5th Digit 3.9 14.0 *36 >38   Motor Summary Table   Stim Site NR Onset (ms) Norm Onset (ms) O-P Amp (mV) Norm O-P Amp Site1 Site2 Delta-0 (ms) Dist (cm) Vel (m/s) Norm Vel (m/s)  Left Median Motor (Abd Poll Brev)  31.4C  Wrist    4.2 <4.2 6.6 >5 Elbow Wrist 4.3 19.0 *44 >50  Elbow    8.5  5.7         Right Median Motor (Abd Poll Brev)  31.9C  Wrist    *4.3 <  4.2 6.8 >5 Elbow Wrist 4.8 19.5 *41 >50  Elbow    9.1  4.1         Left Ulnar Motor (Abd Dig Min)  31.7C  Wrist    2.9 <4.2 6.5 >3 B Elbow Wrist 3.1 17.0 55 >53  B Elbow    6.0  9.9  A Elbow B Elbow 1.7 10.0 59 >53  A Elbow    7.7  3.4         Right Ulnar Motor (Abd Dig Min)  32.3C  Wrist    2.9 <4.2 6.7 >3 B Elbow Wrist 3.4 19.0 56 >53  B Elbow    6.3  5.2  A Elbow B Elbow 1.2 10.0 83 >53  A Elbow    7.5  1.8          EMG   Side Muscle Nerve Root Ins Act Fibs Psw Amp Dur Poly Recrt Int Dennie Bible Comment  Right Abd Poll Brev Median C8-T1 Nml Nml Nml Nml Nml 0 Nml Nml   Right 1stDorInt Ulnar C8-T1 Nml Nml Nml Nml Nml 0 Nml Nml   Right PronatorTeres Median C6-7 Nml Nml Nml Nml Nml 0 Nml Nml   Right Biceps Musculocut C5-6 Nml Nml Nml Nml Nml 0 Nml Nml   Right Deltoid Axillary C5-6 Nml Nml Nml Nml Nml 0 Nml Nml     Nerve Conduction Studies Anti Sensory Left/Right Comparison   Stim Site L Lat (ms) R Lat (ms) L-R Lat (ms) L Amp (V) R Amp (V) L-R Amp (%) Site1 Site2 L Vel (m/s) R Vel (m/s) L-R Vel (m/s)  Median Acr Palm Anti Sensory (2nd Digit)  30.6C  Wrist *4.8 *4.8 0.0 23.3 16.3 30.0 Wrist Palm     Palm *2.5   7.4         Radial Anti Sensory (Base 1st Digit)  31.3C  Wrist 2.6 2.9 0.3 17.4 8.1 53.4 Wrist  Base 1st Digit     Ulnar Anti Sensory (5th Digit)  31.5C  Wrist *4.5 *3.9 *0.6 *2.7 17.8 *84.8 Wrist 5th Digit *31 *36 5   Motor Left/Right Comparison   Stim Site L Lat (ms) R Lat (ms) L-R Lat (ms) L Amp (mV) R Amp (mV) L-R Amp (%) Site1 Site2 L Vel (m/s) R Vel (m/s) L-R Vel (m/s)  Median Motor (Abd Poll Brev)  31.4C  Wrist 4.2 *4.3 0.1 6.6 6.8 2.9 Elbow Wrist *44 *41 3  Elbow 8.5 9.1 0.6 5.7 4.1 28.1       Ulnar Motor (Abd Dig Min)  31.7C  Wrist 2.9 2.9 0.0 6.5 6.7 3.0 B Elbow Wrist 55 56 1  B Elbow 6.0 6.3 0.3 9.9 5.2 47.5 A Elbow B Elbow 59 83 *24  A Elbow 7.7 7.5 0.2 3.4 1.8 47.1          Waveforms:                      Clinical History: No specialty comments available.   She reports that she quit smoking about 35 years ago. Her smoking use included cigarettes. She started smoking about 55 years ago. She has a 20 pack-year smoking history. She has been exposed to tobacco smoke. She has never used smokeless tobacco.  Recent Labs    04/30/22 0158 01/27/23 1137  HGBA1C 5.8*  --   LABURIC  --  7.3*    Objective:  VS:  HT:    WT:   BMI:  BP:   HR: bpm  TEMP: ( )  RESP:  Physical Exam Vitals and nursing note reviewed.  Constitutional:      General: She is not in acute distress.    Appearance: Normal appearance. She is well-developed. She is obese.  HENT:     Head: Normocephalic and atraumatic.     Nose: Nose normal.     Mouth/Throat:     Mouth: Mucous membranes are moist.     Pharynx: Oropharynx is clear.  Eyes:     Conjunctiva/sclera: Conjunctivae normal.     Pupils: Pupils are equal, round, and reactive to light.  Cardiovascular:     Rate and Rhythm: Regular rhythm.  Pulmonary:     Effort: Pulmonary effort is normal. No respiratory distress.  Abdominal:     General: There is no distension.     Palpations: Abdomen is soft.     Tenderness: There is no guarding.  Musculoskeletal:        General: No swelling, tenderness or deformity.      Cervical back: Normal range of motion and neck supple.     Right lower leg: No edema.     Left lower leg: No edema.     Comments: Inspection reveals no atrophy of the bilateral APB or FDI or hand intrinsics. There is no swelling, color changes, allodynia or dystrophic changes. There is 5 out of 5 strength in the bilateral wrist extension, finger abduction and long finger flexion. There is intact sensation to light touch in all dermatomal and peripheral nerve distributions. There is a negative Tinel's test at the bilateral wrist and elbow. There is a positive Phalen's test bilaterally. There is a negative Hoffmann's test bilaterally.  Skin:    General: Skin is warm and dry.     Findings: No erythema or rash.  Neurological:     General: No focal deficit present.     Mental Status: She is alert and oriented to person, place, and time.     Cranial Nerves: No cranial nerve deficit.     Sensory: No sensory deficit.     Motor: Weakness present. No abnormal muscle tone.     Coordination: Coordination normal.     Gait: Gait abnormal.  Psychiatric:        Mood and Affect: Mood normal.        Behavior: Behavior normal.        Thought Content: Thought content normal.     Ortho Exam  Imaging: No results found.  Past Medical/Family/Surgical/Social History: Medications & Allergies reviewed per EMR, new medications updated. Patient Active Problem List   Diagnosis Date Noted   Carpal tunnel syndrome, right upper limb 01/27/2023   Acute exacerbation of chronic obstructive pulmonary disease (COPD) (HCC) 01/13/2023   Dysuria 11/26/2022   Hypercoagulable state due to persistent atrial fibrillation (HCC) 11/18/2022   Acute on chronic heart failure with preserved ejection fraction (HCC) 10/26/2022   COPD with acute exacerbation (HCC) 10/09/2022   Pain in left shoulder 07/23/2022   Chronic obstructive pulmonary disease (HCC) 06/28/2022   Palliative care encounter 06/28/2022   Post-viral cough syndrome  06/02/2022   Paranasal sinus disease 05/19/2022   Heart failure with reduced ejection fraction (HCC) 05/14/2022   Pneumonia 04/29/2022   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   Depression, major, single episode, moderate (HCC) 04/27/2022   Non-STEMI (non-ST elevated myocardial infarction) (HCC) 03/18/2022   Morbid obesity (HCC) 03/18/2022   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   Low TSH level 12/02/2021   Acute idiopathic  gout of left hand 11/19/2021   Acquired hypothyroidism 11/12/2021   Encounter for palliative care involving management of pain 11/12/2021   Post concussive syndrome 11/11/2021   Chronic left-sided low back pain 11/04/2021   Gout 10/01/2021   Hyperlipidemia with target LDL less than 100 08/07/2021   Chronic anticoagulation 07/07/2021   Stage 3a chronic kidney disease (HCC) 05/23/2021   Dietary iron deficiency 05/23/2021   Age-related osteoporosis without current pathological fracture 04/22/2021   Type 2 diabetes mellitus with diabetic neuropathy, without long-term current use of insulin (HCC) 04/22/2021   Sleep apnea 01/22/2021   Statin intolerance 10/12/2019   Constipation    Statin myopathy 02/24/2019   Chronic systolic heart failure (HCC) 04/06/2018   HTN (hypertension) 04/06/2018   Atrial fibrillation (HCC) 04/06/2018   Chronic respiratory failure with hypoxia (HCC) 03/28/2018   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 12/02/2015   Malnutrition of moderate degree 09/11/2015   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    Concussion 10/04/2021   COPD (chronic obstructive pulmonary disease) (HCC)    Gout    Hypertension    Longstanding persistent atrial fibrillation (HCC)    on Xarelto   Nonischemic cardiomyopathy (HCC)    Obesity    Pacemaker     Spontaneous pneumothorax 2013   Family History  Problem Relation Age of Onset   Breast cancer Mother 97       3 different times   Pancreatic cancer Mother    Emphysema Father    Healthy Brother    Breast cancer Cousin    Valvular heart disease Son    Obesity Daughter    Colon cancer Neg Hx    Esophageal cancer Neg Hx    Stomach cancer Neg Hx    Inflammatory bowel disease Neg Hx    Liver disease Neg Hx    Rectal cancer Neg Hx    Past Surgical History:  Procedure Laterality Date   ABDOMINAL HYSTERECTOMY     APPENDECTOMY     ATRIAL FIBRILLATION ABLATION  07/20/2013   by Dr Christin Fudge   AV nodal ablation  11/01/2013   by Dr Christin Fudge, repeated by Dr Wilford Grist   BREAST BIOPSY Bilateral 1997   negative   CARDIAC CATHETERIZATION     CHOLECYSTECTOMY     HERNIA REPAIR     PACEMAKER INSERTION  06/2017   MDT Viva CRT-P implanted by Dr Christin Fudge after AV nodal ablation,  LV lead could not be placed   PACEMAKER INSERTION  05/2020   with lead bundle   RIGHT/LEFT HEART CATH AND CORONARY ANGIOGRAPHY N/A 03/20/2022   Procedure: RIGHT/LEFT HEART CATH AND CORONARY ANGIOGRAPHY;  Surgeon: [place], Peter M, MD;  Location: [facility] INVASIVE CV LAB;  Service: Cardiovascular;  Laterality: N/A;   Social History   Occupational History   Occupation: retired  Tobacco Use   Smoking status: Former    Current packs/day: 0.00    Average packs/day: 1 pack/day for 20.0 years (20.0 ttl pk-yrs)    Types: Cigarettes    Start date: 06/09/1967    Quit date: 06/09/1987    Years since quitting: 35.7    Passive exposure: Past   Smokeless tobacco: Never   Tobacco comments:    Former smoker 12/25/21  Vaping Use   Vaping status:  Never Used  Substance and Sexual Activity   Alcohol use: No    Alcohol/week: 0.0 standard drinks of alcohol   Drug use: No   Sexual activity: Not Currently

## 2023-02-17 ENCOUNTER — Ambulatory Visit: Payer: Self-pay

## 2023-02-17 ENCOUNTER — Telehealth: Payer: Self-pay | Admitting: *Deleted

## 2023-02-17 NOTE — Patient Instructions (Signed)
Visit Information  Thank you for taking time to visit with me today. Please don't hesitate to contact me if I can be of assistance to you.   Following are the goals we discussed today:  Continue to take medications as prescribed. Continue to attend provider visits as scheduled Continue to keep track of your daily weights Continue to eat healthy, lean meats, vegetables, fruits, avoid saturated and transfats Contact your provider with health questions or concerns  Our next appointment is by telephone on 03/19/23 at 1:15pm  Please call the care guide team at [phone] if you need to cancel or reschedule your appointment.   If you are experiencing a Mental Health or Behavioral Health Crisis or need someone to talk to, please call the Suicide and Crisis Lifeline: 988 call the [place] National Suicide Prevention Lifeline: (423)829-7478 or TTY: 760 352 1278 TTY 908-684-7538) to talk to a trained counselor call 1-800-273-TALK (toll free, 24 hour hotline)  Kathyrn Sheriff, RN, MSN, BSN, CCM Care Management Coordinator [phone]

## 2023-02-17 NOTE — Patient Outreach (Signed)
  Care Coordination   Follow Up Visit Note   02/17/2023 Name: RIYANSHI OLDFIELD MRN: [id number] DOB: June 25, 1945  KHAIYA MOBILIA is a 77 y.o. year old female who sees [name], MD for primary care. I spoke with  [name] by phone today.  What matters to the patients health and wellness today?  Ms. Vanscoyk reports she is doing better. She reports her cough has improved. She states she lives in an independent living center now, but is interested in looking into other independent living facilities. She would like to speak with someone regarding senior resource independent living alternatives.   Goals Addressed             This Visit's Progress    Assist with health management post hospitalization       Interventions Today    Flowsheet Row Most Recent Value  Chronic Disease   Chronic disease during today's visit Chronic Obstructive Pulmonary Disease (COPD), Congestive Heart Failure (CHF), Hypertension (HTN)  General Interventions   General Interventions Discussed/Reviewed General Interventions Reviewed, Durable Medical Equipment (DME)  Doctor Visits Discussed/Reviewed Doctor Visits Discussed  Durable Medical Equipment (DME) Other, Oxygen  [BIPAP]  PCP/Specialist Visits Compliance with follow-up visit  [reviewed upcoming follow up appointments]  Exercise Interventions   Exercise Discussed/Reviewed Exercise Reviewed  Education Interventions   Education Provided Provided Education  [reviewed patient instructions per provider office visit 02/04/23 HF clinic and 02/12/23 pulmnonary office visit Patient reports has communicated with providers and understands as instructed by provider]  Provided Verbal Education On Medication, Exercise, Other  [discussed signs/symptoms of HF exacerbation and when to call the doctor]  Mental Health Interventions   Mental Health Discussed/Reviewed Refer to Social Work for resources  [other resources for independent living]  Refer to Social Work for  resources regarding --  [independent living resources]  Nutrition Interventions   Nutrition Discussed/Reviewed Nutrition Reviewed  [discussed low salt diet, encouraged to monitor fluid intake-recommendation <2L/day or per Provider recommendations, discussed importance of being as active as possible]  Pharmacy Interventions   Pharmacy Dicussed/Reviewed Pharmacy Topics Reviewed  Advanced Directive Interventions   End of Life Palliative  [confirms active with palliative care]            SDOH assessments and interventions completed:  No  Care Coordination Interventions:  Yes, provided   Follow up plan: Follow up call scheduled for 03/19/23    Encounter Outcome:  Patient Visit Completed   Kathyrn Sheriff, RN, MSN, BSN, CCM Care Management Coordinator [phone]

## 2023-02-17 NOTE — Progress Notes (Signed)
  Care Coordination Note  02/17/2023 Name: BRITTNEE GNAU MRN: [id number] DOB: [birth year]  Kathleen [name] is a 77 y.o. year old female who is a primary care patient of [name], MD and is actively engaged with the care management team. I reached out to [name] by phone today to assist with scheduling an initial visit with the Licensed Clinical Social Worker  Follow up plan: Telephone appointment with care management team member scheduled for: 02/24/2023  Burman Nieves, [facility] Care Coordination Care Guide Direct Dial: [phone]

## 2023-02-18 ENCOUNTER — Inpatient Hospital Stay: Payer: Medicare Other | Admitting: Adult Health

## 2023-02-22 ENCOUNTER — Other Ambulatory Visit: Payer: Medicare Other | Admitting: Internal Medicine

## 2023-02-22 ENCOUNTER — Emergency Department (HOSPITAL_BASED_OUTPATIENT_CLINIC_OR_DEPARTMENT_OTHER): Payer: Medicare Other

## 2023-02-22 ENCOUNTER — Telehealth: Payer: Self-pay | Admitting: Pulmonary Disease

## 2023-02-22 ENCOUNTER — Other Ambulatory Visit (HOSPITAL_BASED_OUTPATIENT_CLINIC_OR_DEPARTMENT_OTHER): Payer: Self-pay

## 2023-02-22 ENCOUNTER — Observation Stay (HOSPITAL_COMMUNITY): Payer: Medicare Other

## 2023-02-22 ENCOUNTER — Encounter (HOSPITAL_BASED_OUTPATIENT_CLINIC_OR_DEPARTMENT_OTHER): Payer: Self-pay

## 2023-02-22 ENCOUNTER — Inpatient Hospital Stay (HOSPITAL_BASED_OUTPATIENT_CLINIC_OR_DEPARTMENT_OTHER)
Admission: EM | Admit: 2023-02-22 | Discharge: 2023-02-25 | DRG: 392 | Disposition: A | Discharge status: Home / Self Care | Payer: Medicare Other | Attending: Internal Medicine | Admitting: Internal Medicine

## 2023-02-22 DIAGNOSIS — J4489 Other specified chronic obstructive pulmonary disease: Secondary | ICD-10-CM | POA: Diagnosis present

## 2023-02-22 DIAGNOSIS — I5022 Chronic systolic (congestive) heart failure: Secondary | ICD-10-CM | POA: Diagnosis not present

## 2023-02-22 DIAGNOSIS — Z90710 Acquired absence of both cervix and uterus: Secondary | ICD-10-CM | POA: Diagnosis not present

## 2023-02-22 DIAGNOSIS — K5732 Diverticulitis of large intestine without perforation or abscess without bleeding: Principal | ICD-10-CM | POA: Diagnosis present

## 2023-02-22 DIAGNOSIS — I428 Other cardiomyopathies: Secondary | ICD-10-CM | POA: Diagnosis present

## 2023-02-22 DIAGNOSIS — I5042 Chronic combined systolic (congestive) and diastolic (congestive) heart failure: Secondary | ICD-10-CM | POA: Diagnosis present

## 2023-02-22 DIAGNOSIS — K5792 Diverticulitis of intestine, part unspecified, without perforation or abscess without bleeding: Secondary | ICD-10-CM | POA: Diagnosis not present

## 2023-02-22 DIAGNOSIS — Z887 Allergy status to serum and vaccine status: Secondary | ICD-10-CM

## 2023-02-22 DIAGNOSIS — Z87820 Personal history of traumatic brain injury: Secondary | ICD-10-CM

## 2023-02-22 DIAGNOSIS — I480 Paroxysmal atrial fibrillation: Secondary | ICD-10-CM

## 2023-02-22 DIAGNOSIS — D631 Anemia in chronic kidney disease: Secondary | ICD-10-CM | POA: Diagnosis present

## 2023-02-22 DIAGNOSIS — G4730 Sleep apnea, unspecified: Secondary | ICD-10-CM | POA: Diagnosis not present

## 2023-02-22 DIAGNOSIS — Z7901 Long term (current) use of anticoagulants: Secondary | ICD-10-CM

## 2023-02-22 DIAGNOSIS — N1831 Chronic kidney disease, stage 3a: Secondary | ICD-10-CM | POA: Diagnosis not present

## 2023-02-22 DIAGNOSIS — I4891 Unspecified atrial fibrillation: Secondary | ICD-10-CM | POA: Diagnosis present

## 2023-02-22 DIAGNOSIS — I10 Essential (primary) hypertension: Secondary | ICD-10-CM | POA: Diagnosis present

## 2023-02-22 DIAGNOSIS — E876 Hypokalemia: Secondary | ICD-10-CM | POA: Diagnosis not present

## 2023-02-22 DIAGNOSIS — Z950 Presence of cardiac pacemaker: Secondary | ICD-10-CM

## 2023-02-22 DIAGNOSIS — E1122 Type 2 diabetes mellitus with diabetic chronic kidney disease: Secondary | ICD-10-CM | POA: Diagnosis present

## 2023-02-22 DIAGNOSIS — N823 Fistula of vagina to large intestine: Secondary | ICD-10-CM | POA: Diagnosis not present

## 2023-02-22 DIAGNOSIS — Z825 Family history of asthma and other chronic lower respiratory diseases: Secondary | ICD-10-CM

## 2023-02-22 DIAGNOSIS — Z7951 Long term (current) use of inhaled steroids: Secondary | ICD-10-CM

## 2023-02-22 DIAGNOSIS — I130 Hypertensive heart and chronic kidney disease with heart failure and stage 1 through stage 4 chronic kidney disease, or unspecified chronic kidney disease: Secondary | ICD-10-CM | POA: Diagnosis not present

## 2023-02-22 DIAGNOSIS — M25511 Pain in right shoulder: Secondary | ICD-10-CM | POA: Diagnosis present

## 2023-02-22 DIAGNOSIS — N824 Other female intestinal-genital tract fistulae: Secondary | ICD-10-CM | POA: Diagnosis not present

## 2023-02-22 DIAGNOSIS — J9611 Chronic respiratory failure with hypoxia: Secondary | ICD-10-CM | POA: Diagnosis present

## 2023-02-22 DIAGNOSIS — Z9981 Dependence on supplemental oxygen: Secondary | ICD-10-CM | POA: Diagnosis not present

## 2023-02-22 DIAGNOSIS — E1140 Type 2 diabetes mellitus with diabetic neuropathy, unspecified: Secondary | ICD-10-CM | POA: Diagnosis present

## 2023-02-22 DIAGNOSIS — Z87891 Personal history of nicotine dependence: Secondary | ICD-10-CM

## 2023-02-22 DIAGNOSIS — K5900 Constipation, unspecified: Secondary | ICD-10-CM | POA: Diagnosis present

## 2023-02-22 DIAGNOSIS — Z881 Allergy status to other antibiotic agents status: Secondary | ICD-10-CM | POA: Diagnosis not present

## 2023-02-22 DIAGNOSIS — Z888 Allergy status to other drugs, medicaments and biological substances status: Secondary | ICD-10-CM

## 2023-02-22 DIAGNOSIS — I4811 Longstanding persistent atrial fibrillation: Secondary | ICD-10-CM | POA: Diagnosis not present

## 2023-02-22 DIAGNOSIS — Z9109 Other allergy status, other than to drugs and biological substances: Secondary | ICD-10-CM

## 2023-02-22 DIAGNOSIS — Z79899 Other long term (current) drug therapy: Secondary | ICD-10-CM

## 2023-02-22 DIAGNOSIS — N898 Other specified noninflammatory disorders of vagina: Secondary | ICD-10-CM | POA: Diagnosis present

## 2023-02-22 LAB — CBC WITH DIFFERENTIAL/PLATELET
Abs Immature Granulocytes: 0.1 10*3/uL — ABNORMAL HIGH (ref 0.00–0.07)
Basophils Absolute: 0.1 10*3/uL (ref 0.0–0.1)
Basophils Relative: 1 %
Eosinophils Absolute: 0.1 10*3/uL (ref 0.0–0.5)
Eosinophils Relative: 1 %
HCT: 33.6 % — ABNORMAL LOW (ref 36.0–46.0)
Hemoglobin: 10.9 g/dL — ABNORMAL LOW (ref 12.0–15.0)
Immature Granulocytes: 1 %
Lymphocytes Relative: 7 %
Lymphs Abs: 0.7 10*3/uL (ref 0.7–4.0)
MCH: 27.9 pg (ref 26.0–34.0)
MCHC: 32.4 g/dL (ref 30.0–36.0)
MCV: 85.9 fL (ref 80.0–100.0)
Monocytes Absolute: 0.6 10*3/uL (ref 0.1–1.0)
Monocytes Relative: 7 %
Neutro Abs: 8 10*3/uL — ABNORMAL HIGH (ref 1.7–7.7)
Neutrophils Relative %: 83 %
Platelets: 304 10*3/uL (ref 150–400)
RBC: 3.91 MIL/uL (ref 3.87–5.11)
RDW: 15.9 % — ABNORMAL HIGH (ref 11.5–15.5)
WBC: 9.6 10*3/uL (ref 4.0–10.5)
nRBC: 0 % (ref 0.0–0.2)

## 2023-02-22 LAB — URINALYSIS, ROUTINE W REFLEX MICROSCOPIC
Bilirubin Urine: NEGATIVE
Glucose, UA: NEGATIVE mg/dL
Hgb urine dipstick: NEGATIVE
Ketones, ur: NEGATIVE mg/dL
Leukocytes,Ua: NEGATIVE
Nitrite: NEGATIVE
Protein, ur: NEGATIVE mg/dL
Specific Gravity, Urine: 1.007 (ref 1.005–1.030)
pH: 6.5 (ref 5.0–8.0)

## 2023-02-22 LAB — BASIC METABOLIC PANEL
Anion gap: 12 (ref 5–15)
BUN: 17 mg/dL (ref 8–23)
CO2: 27 mmol/L (ref 22–32)
Calcium: 8.9 mg/dL (ref 8.9–10.3)
Chloride: 97 mmol/L — ABNORMAL LOW (ref 98–111)
Creatinine, Ser: 0.91 mg/dL (ref 0.44–1.00)
GFR, Estimated: >60 mL/min (ref >60)
Glucose, Bld: 122 mg/dL — ABNORMAL HIGH (ref 70–99)
Potassium: 3.1 mmol/L — ABNORMAL LOW (ref 3.5–5.1)
Sodium: 136 mmol/L (ref 135–145)

## 2023-02-22 LAB — COMPREHENSIVE METABOLIC PANEL
ALT: 6 U/L (ref 0–44)
AST: 8 U/L — ABNORMAL LOW (ref 15–41)
Albumin: 3.8 g/dL (ref 3.5–5.0)
Alkaline Phosphatase: 39 U/L (ref 38–126)
Anion gap: 11 (ref 5–15)
BUN: 21 mg/dL (ref 8–23)
CO2: 30 mmol/L (ref 22–32)
Calcium: 8.8 mg/dL — ABNORMAL LOW (ref 8.9–10.3)
Chloride: 96 mmol/L — ABNORMAL LOW (ref 98–111)
Creatinine, Ser: 0.8 mg/dL (ref 0.44–1.00)
GFR, Estimated: >60 mL/min (ref >60)
Glucose, Bld: 157 mg/dL — ABNORMAL HIGH (ref 70–99)
Potassium: 3.3 mmol/L — ABNORMAL LOW (ref 3.5–5.1)
Sodium: 137 mmol/L (ref 135–145)
Total Bilirubin: 0.7 mg/dL (ref 0.3–1.2)
Total Protein: 6.6 g/dL (ref 6.5–8.1)

## 2023-02-22 LAB — FOLATE: Folate: 15.8 ng/mL (ref >5.9)

## 2023-02-22 LAB — HEMOGLOBIN A1C
Hgb A1c MFr Bld: 5.5 % (ref 4.8–5.6)
Mean Plasma Glucose: 111.15 mg/dL

## 2023-02-22 LAB — PHOSPHORUS: Phosphorus: 2.3 mg/dL — ABNORMAL LOW (ref 2.5–4.6)

## 2023-02-22 LAB — LIPASE, BLOOD: Lipase: 28 U/L (ref 11–51)

## 2023-02-22 LAB — MAGNESIUM: Magnesium: 2.3 mg/dL (ref 1.7–2.4)

## 2023-02-22 LAB — FERRITIN: Ferritin: 126 ng/mL (ref 11–307)

## 2023-02-22 LAB — RETICULOCYTES
Immature Retic Fract: 22 % — ABNORMAL HIGH (ref 2.3–15.9)
RBC.: 3.89 MIL/uL (ref 3.87–5.11)
Retic Count, Absolute: 138.9 10*3/uL (ref 19.0–186.0)
Retic Ct Pct: 3.6 % — ABNORMAL HIGH (ref 0.4–3.1)

## 2023-02-22 LAB — IRON AND TIBC
Iron: 37 ug/dL (ref 28–170)
Saturation Ratios: 10 % — ABNORMAL LOW (ref 10.4–31.8)
TIBC: 386 ug/dL (ref 250–450)
UIBC: 349 ug/dL

## 2023-02-22 LAB — TSH: TSH: 2.213 u[IU]/mL (ref 0.350–4.500)

## 2023-02-22 LAB — VITAMIN B12: Vitamin B-12: 797 pg/mL (ref 180–914)

## 2023-02-22 MED ORDER — ONDANSETRON HCL 4 MG PO TABS
4.0000 mg | ORAL_TABLET | Freq: Four times a day (QID) | ORAL | Status: DC | PRN
  Administered 2023-02-25: 4 mg via ORAL
  Filled 2023-02-22: qty 1

## 2023-02-22 MED ORDER — SODIUM CHLORIDE 0.9 % IV SOLN: 250.0000 mL | INTRAVENOUS | Status: DC | PRN

## 2023-02-22 MED ORDER — PIPERACILLIN-TAZOBACTAM 3.375 G IVPB
3.3750 g | Freq: Three times a day (TID) | INTRAVENOUS | Status: DC
  Administered 2023-02-22 – 2023-02-25 (×9): 3.375 g via INTRAVENOUS
  Filled 2023-02-22 (×9): qty 50

## 2023-02-22 MED ORDER — HYDROCODONE-ACETAMINOPHEN 5-325 MG PO TABS
1.0000 | ORAL_TABLET | ORAL | Status: DC | PRN
  Administered 2023-02-23 – 2023-02-25 (×3): 1 via ORAL
  Filled 2023-02-22 (×3): qty 1

## 2023-02-22 MED ORDER — ACETAMINOPHEN 650 MG RE SUPP: 650.0000 mg | Freq: Four times a day (QID) | RECTAL | Status: DC | PRN

## 2023-02-22 MED ORDER — FENTANYL CITRATE PF 50 MCG/ML IJ SOSY: 12.5000 ug | PREFILLED_SYRINGE | INTRAMUSCULAR | Status: DC | PRN

## 2023-02-22 MED ORDER — POTASSIUM CHLORIDE 10 MEQ/100ML IV SOLN
10.0000 meq | INTRAVENOUS | Status: AC
  Administered 2023-02-23 (×4): 10 meq via INTRAVENOUS
  Filled 2023-02-22 (×5): qty 100

## 2023-02-22 MED ORDER — ACETAMINOPHEN 325 MG PO TABS: 650.0000 mg | ORAL_TABLET | Freq: Four times a day (QID) | ORAL | Status: DC | PRN

## 2023-02-22 MED ORDER — RIVAROXABAN 20 MG PO TABS
20.0000 mg | ORAL_TABLET | Freq: Every day | ORAL | Status: DC
  Administered 2023-02-22 – 2023-02-24 (×3): 20 mg via ORAL
  Filled 2023-02-22 (×3): qty 1

## 2023-02-22 MED ORDER — INSULIN ASPART 100 UNIT/ML IJ SOLN
0.0000 [IU] | INTRAMUSCULAR | Status: DC
  Administered 2023-02-23: 2 [IU] via SUBCUTANEOUS
  Administered 2023-02-23 (×3): 1 [IU] via SUBCUTANEOUS
  Administered 2023-02-23: 2 [IU] via SUBCUTANEOUS
  Administered 2023-02-24: 1 [IU] via SUBCUTANEOUS
  Administered 2023-02-24: 2 [IU] via SUBCUTANEOUS
  Administered 2023-02-25 (×2): 1 [IU] via SUBCUTANEOUS

## 2023-02-22 MED ORDER — ONDANSETRON HCL 4 MG/2ML IJ SOLN: 4.0000 mg | Freq: Four times a day (QID) | INTRAMUSCULAR | Status: DC | PRN

## 2023-02-22 MED ORDER — REVEFENACIN 175 MCG/3ML IN SOLN
175.0000 ug | Freq: Every day | RESPIRATORY_TRACT | Status: DC
  Administered 2023-02-23 – 2023-02-25 (×3): 175 ug via RESPIRATORY_TRACT
  Filled 2023-02-22 (×2): qty 3

## 2023-02-22 MED ORDER — POTASSIUM CHLORIDE CRYS ER 20 MEQ PO TBCR
40.0000 meq | EXTENDED_RELEASE_TABLET | ORAL | Status: AC
  Administered 2023-02-22: 40 meq via ORAL
  Filled 2023-02-22: qty 2

## 2023-02-22 MED ORDER — SODIUM CHLORIDE 0.9% FLUSH
3.0000 mL | Freq: Two times a day (BID) | INTRAVENOUS | Status: DC
  Administered 2023-02-22 – 2023-02-25 (×6): 3 mL via INTRAVENOUS

## 2023-02-22 MED ORDER — POTASSIUM CHLORIDE ER 10 MEQ PO TBCR
10.0000 meq | EXTENDED_RELEASE_TABLET | Freq: Every day | ORAL | Status: DC
  Administered 2023-02-23 – 2023-02-25 (×3): 10 meq via ORAL
  Filled 2023-02-22 (×7): qty 1

## 2023-02-22 MED ORDER — TORSEMIDE 20 MG PO TABS
20.0000 mg | ORAL_TABLET | Freq: Every day | ORAL | Status: DC
  Administered 2023-02-23 – 2023-02-25 (×3): 20 mg via ORAL
  Filled 2023-02-22 (×3): qty 1

## 2023-02-22 MED ORDER — PIPERACILLIN-TAZOBACTAM 3.375 G IVPB 30 MIN
3.3750 g | Freq: Once | INTRAVENOUS | Status: AC
  Administered 2023-02-22: 3.375 g via INTRAVENOUS
  Filled 2023-02-22: qty 50

## 2023-02-22 MED ORDER — IOHEXOL 300 MG/ML  SOLN
100.0000 mL | Freq: Once | INTRAMUSCULAR | Status: AC | PRN
  Administered 2023-02-22: 100 mL via INTRAVENOUS

## 2023-02-22 MED ORDER — SODIUM CHLORIDE 0.9% FLUSH: 3.0000 mL | INTRAVENOUS | Status: DC | PRN

## 2023-02-22 MED ORDER — GUAIFENESIN ER 600 MG PO TB12
600.0000 mg | ORAL_TABLET | Freq: Two times a day (BID) | ORAL | Status: DC
  Administered 2023-02-22 – 2023-02-25 (×6): 600 mg via ORAL
  Filled 2023-02-22 (×6): qty 1

## 2023-02-22 MED ORDER — DOFETILIDE 250 MCG PO CAPS
250.0000 ug | ORAL_CAPSULE | Freq: Two times a day (BID) | ORAL | Status: DC
  Administered 2023-02-23 – 2023-02-25 (×6): 250 ug via ORAL
  Filled 2023-02-22 (×8): qty 1

## 2023-02-22 MED ORDER — POTASSIUM CHLORIDE ER 10 MEQ PO TBCR
10.0000 meq | EXTENDED_RELEASE_TABLET | Freq: Every day | ORAL | Status: DC
  Filled 2023-02-22: qty 1

## 2023-02-22 MED ORDER — ALBUTEROL SULFATE (2.5 MG/3ML) 0.083% IN NEBU: 2.5000 mg | INHALATION_SOLUTION | RESPIRATORY_TRACT | Status: DC | PRN

## 2023-02-22 MED ORDER — GABAPENTIN 100 MG PO CAPS
200.0000 mg | ORAL_CAPSULE | Freq: Every day | ORAL | Status: DC
  Administered 2023-02-22 – 2023-02-24 (×3): 200 mg via ORAL
  Filled 2023-02-22 (×3): qty 2

## 2023-02-22 MED ORDER — FLUTICASONE FUROATE-VILANTEROL 100-25 MCG/ACT IN AEPB
1.0000 | INHALATION_SPRAY | Freq: Every day | RESPIRATORY_TRACT | Status: DC
  Administered 2023-02-23 – 2023-02-25 (×3): 1 via RESPIRATORY_TRACT
  Filled 2023-02-22: qty 28

## 2023-02-22 NOTE — Assessment & Plan Note (Signed)
no Evidence of perforation - Bowel rest clear liquid/   - Will rehydrate  - Continue IV antibiotics as patient was unable to tolerate p.o. due to significant nausea  and failure of out pt treatment                started on  zosyn on 02/22/23

## 2023-02-22 NOTE — ED Triage Notes (Signed)
Pt c/o "pooping out of my vagina" onset 1wk ago, "getting worse- now I'm wearing a panty liner at night." Described as "slime- brown, but this morning had a red tint." LLQ abd pain

## 2023-02-22 NOTE — ED Notes (Signed)
Kim at [facility] will send transport for Bed Ready at [facility] [address].-ABB(NS)

## 2023-02-22 NOTE — H&P (Signed)
[name]:[id number] DOB: [birth year] DOA: 02/22/2023     PCP: [name], MD   Outpatient Specialists:  CARDS: Dr. Gala Romney   Neurology Dr. Teresa Coombs Pulmonary  Parrett, Virgel Bouquet, NP DR Vassie Loll  GI Dr. Meridee Score, Netty Starring   [facility]) No care team member to display   Patient arrived to ER on 02/22/23 at 0927 Referred by Attending Therisa Doyne, MD   Patient coming from:    home Lives alone,     Chief Complaint:  brown vaginal discharge   HPI: Kathleen [name] is a 77 y.o. female with medical history significant of COPD/asthma chronic respiratory failure, tracheobronchomalacia CHF AV block, history of BOOP,, CKD, gout, hypertension A-fib on Xarelto obesity  Presented with   vaginal brown discharge 1 week of foul brown drainage from vagina getting worse now and she needs to wear panty liner at night slimy brown mucus discharge with some red tent also associated left lower quadrant abdominal pain Never had a colonoscopy in has been somewhat constipated has tried to use sorbitol for constipation passing skinny stools History of COPD on 4 to 5 L of O2 at baseline Longstanding lung disease 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 Started on Zosyn and general surgery was consulted recommend admit to internal medicine and call back if needed   Patient with history of tracheobronchomalacia not a candidate for stenting Recently seen by pulmonary given cough was prescribed Augmentin for 1 week She is supposed to use BiPAP at night but has not been wearing it Fever today Abd pain long standing Has been having recurrent prednisone tapers Does not use pessary Reports a bit of weight loss  Denies significant ETOH intake  Does not smoke  Lab Results  Component Value Date   SARSCOV2NAA NEGATIVE 01/13/2023   SARSCOV2NAA NEGATIVE 04/28/2022   SARSCOV2NAA NEGATIVE 03/17/2022   SARSCOV2NAA NEGATIVE 05/15/2021       Regarding pertinent Chronic problems:        HTN on Cozaar   chronic CHF diastolic/systolic/ combined - last echo  On metolazone spironolactone torsemide Recent Results (from the past 78469 hour(s))  ECHOCARDIOGRAM COMPLETE   Collection Time: 09/15/22 11:08 AM  Result Value   S' Lateral 4.90   Single Plane A4C EF 34.8   Single Plane A2C EF 40.2   Calc EF 38.3   Area-P 1/2 3.12   Est EF 35 - 40%   Narrative      ECHOCARDIOGRAM REPORT      IMPRESSIONS    1. Left ventricular ejection fraction, by estimation, is 35 to 40%. The left ventricle has moderately decreased function. The left ventricle demonstrates regional wall motion abnormalities. Abnormal (paradoxical) septal motion, consistent with RV  pacemaker     The left ventricular internal cavity size was moderately dilated. There is mild left ventricular hypertrophy. Left ventricular diastolic parameters are consistent with Grade I diastolic dysfunction (impaired relaxation).  2. Right ventricular systolic function is normal. The right ventricular size is normal.  3. The mitral valve is normal in structure. No evidence of mitral valve regurgitation. No evidence of mitral stenosis.  4. The aortic valve was not well visualized. Aortic valve regurgitation is not visualized. No aortic stenosis is present.  5. The inferior vena cava is normal in size with greater than 50% respiratory variability, suggesting right atrial pressure of 3 mmHg.            obesity-   BMI Readings from Last 1 Encounters:  02/12/23 37.42  kg/m       Asthma - controlled on home inhalers/ nebs             COPD - followed by pulmonology  on baseline oxygen 4-5 L,      OSA -on nocturnal BIPAP     A. Fib -   atrial fibrillation CHA2DS2 vas score    7    current  on anticoagulation with Xarelto            - Rhythm control: Tikosyn   Chronic anemia - baseline hg Hemoglobin & Hematocrit  Recent Labs    01/13/23 1718 01/14/23 0659 02/22/23 0957  HGB 11.5* 10.4* 10.9*   Iron/TIBC/Ferritin/  %Sat    Component Value Date/Time   IRON 34 09/15/2022 1134   TIBC 491 (H) 09/15/2022 1134   FERRITIN 40 09/15/2022 1134   IRONPCTSAT 7 (L) 09/15/2022 1134   While in ER:    Ct found diverticulitis    Lab Orders         Culture, blood (routine x 2)         CBC with Differential         Comprehensive metabolic panel         Lipase, blood         Urinalysis, Routine w reflex microscopic -Urine, Catheterized      CT HEAD *** NON acute   MRI brain  ***no acute CVA  CXR - Minimal bibasilar subsegmental atelectasis.   CTabd/pelvis -  1. Acute proximal sigmoid diverticulitis. No walled off abscess or loculated collection. No pneumoperitoneum. Follow-up colonoscopy is recommended after the acute episode subsides to exclude underlying neoplastic process. 2. 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 3. There is a sub 6 mm (mean diameter), pleural-based right lung lower lobe solid pulmonary nodule. Please see follow-up recommendations below.   Following Medications were ordered in ER: Medications  piperacillin-tazobactam (ZOSYN) IVPB 3.375 g (has no administration in time range)  iohexol (OMNIPAQUE) 300 MG/ML solution 100 mL (100 mLs Intravenous Contrast Given 02/22/23 1148)  piperacillin-tazobactam (ZOSYN) IVPB 3.375 g (0 g Intravenous Stopped 02/22/23 1509)    _______________________________________________________ ER Provider Called:       DrMarland Kitchen  They Recommend admit to medicine *** Will see in AM  ***SEEN in ER   ED Triage Vitals  Encounter Vitals Group     BP 02/22/23 0938 134/67     Systolic BP Percentile --      Diastolic BP Percentile --      Pulse Rate 02/22/23 0938 86     Resp 02/22/23 0938 16     Temp 02/22/23 0938 98.3 F (36.8 C)     Temp Source 02/22/23 1331 Oral     SpO2 02/22/23 0938 96 %     Weight --      Height --       Head Circumference --      Peak Flow --      Pain Score 02/22/23 0935 5     Pain Loc --      Pain Education --      Exclude from Growth Chart --   ZOXW(96)@     _________________________________________ Significant initial  Findings: Abnormal Labs Reviewed  CBC WITH DIFFERENTIAL/PLATELET - Abnormal; Notable for the following components:      Result Value   Hemoglobin 10.9 (*)    HCT 33.6 (*)  RDW 15.9 (*)    Neutro Abs 8.0 (*)    Abs Immature Granulocytes 0.10 (*)    All other components within normal limits  COMPREHENSIVE METABOLIC PANEL - Abnormal; Notable for the following components:   Potassium 3.3 (*)    Chloride 96 (*)    Glucose, Bld 157 (*)    Calcium 8.8 (*)    AST 8 (*)    All other components within normal limits      _________________________ Troponin ***ordered Cardiac Panel (last 3 results) No results for input(s): "CKTOTAL", "CKMB", "TROPONINIHS", "RELINDX" in the last 72 hours.   ECG: Ordered Personally reviewed and interpreted by me showing: HR : *** Rhythm: *NSR, Sinus tachycardia * A.fib. W RVR, RBBB, LBBB, Paced Ischemic changes*nonspecific changes, no evidence of ischemic changes QTC*  BNP (last 3 results) Recent Labs    04/28/22 1200 09/15/22 1134 01/13/23 1718  BNP 58.7 48.0 129.5*     COVID-19 Labs  No results for input(s): "DDIMER", "FERRITIN", "LDH", "CRP" in the last 72 hours.  Lab Results  Component Value Date   SARSCOV2NAA NEGATIVE 01/13/2023   SARSCOV2NAA NEGATIVE 04/28/2022   SARSCOV2NAA NEGATIVE 03/17/2022   SARSCOV2NAA NEGATIVE 05/15/2021       ____________________ This patient meets SIRS Criteria and may be septic. SIRS = Systemic Inflammatory Response Syndrome  Order a lactic acid level if needed AND/OR Initiate the sepsis protocol with the attached order set OR Click "Treating Associated Infection or Illness" if the patient is being treated for an infection that is a known cause of these abnormalities      The recent clinical data is shown below. Vitals:   02/22/23 1250 02/22/23 1331 02/22/23 1405 02/22/23 1749  BP: (!) 95/56  119/65 (!) 93/50  Pulse: 68  79 74  Resp: 16  20 19   Temp:  99.1 F (37.3 C)  98.7 F (37.1 C)  TempSrc:  Oral    SpO2: 99%  100% 100%        WBC     Component Value Date/Time   WBC 9.6 02/22/2023 0957   LYMPHSABS 0.7 02/22/2023 0957   LYMPHSABS 0.4 (L) 12/28/2012 0748   MONOABS 0.6 02/22/2023 0957   MONOABS 0.5 12/28/2012 0748   EOSABS 0.1 02/22/2023 0957   EOSABS 0.0 12/28/2012 0748   BASOSABS 0.1 02/22/2023 0957   BASOSABS 0.0 12/28/2012 0748        Lactic Acid, Venous    Component Value Date/Time   LATICACIDVEN 1.5 04/28/2022 1424      Lactic Acid, Venous    Component Value Date/Time   LATICACIDVEN 1.5 04/28/2022 1424    Procalcitonin *** Ordered      UA *** no evidence of UTI  ***Pending ***not ordered   Urine analysis:    Component Value Date/Time   COLORURINE YELLOW 02/22/2023 1115   APPEARANCEUR CLEAR 02/22/2023 1115   APPEARANCEUR Hazy 12/25/2012 1653   LABSPEC 1.007 02/22/2023 1115   LABSPEC 1.021 12/25/2012 1653   PHURINE 6.5 02/22/2023 1115   GLUCOSEU NEGATIVE 02/22/2023 1115   GLUCOSEU NEGATIVE 08/07/2021 1219   HGBUR NEGATIVE 02/22/2023 1115   BILIRUBINUR NEGATIVE 02/22/2023 1115   BILIRUBINUR Negative 12/25/2012 1653   KETONESUR NEGATIVE 02/22/2023 1115   PROTEINUR NEGATIVE 02/22/2023 1115   UROBILINOGEN 1.0 08/07/2021 1219   NITRITE NEGATIVE 02/22/2023 1115   LEUKOCYTESUR NEGATIVE 02/22/2023 1115   LEUKOCYTESUR Negative 12/25/2012 1653    Results for orders placed or performed during the hospital encounter of 01/13/23  SARS Coronavirus 2  by RT PCR (hospital order, performed in [facility] hospital lab) *cepheid single result test* Anterior Nasal Swab     Status: None   Collection Time: 01/13/23  5:18 PM   Specimen: Anterior Nasal Swab  Result Value Ref Range Status   SARS Coronavirus 2 by RT PCR  NEGATIVE NEGATIVE Final    Comment: (NOTE) SARS-CoV-2 target nucleic acids are NOT DETECTED.  The SARS-CoV-2 RNA is generally detectable in upper and lower respiratory specimens during the acute phase of infection. The lowest concentration of SARS-CoV-2 viral copies this assay can detect is 250 copies / mL. A negative result does not preclude SARS-CoV-2 infection and should not be used as the sole basis for treatment or other patient management decisions.  A negative result may occur with improper specimen collection / handling, submission of specimen other than nasopharyngeal swab, presence of viral mutation(s) within the areas targeted by this assay, and inadequate number of viral copies (<250 copies / mL). A negative result must be combined with clinical observations, patient history, and epidemiological information.  Fact Sheet for Patients:   RoadLapTop.co.za  Fact Sheet for Healthcare Providers: http://kim-miller.com/  This test is not yet approved or  cleared by the [place] FDA and has been authorized for detection and/or diagnosis of SARS-CoV-2 by FDA under an Emergency Use Authorization (EUA).  This EUA will remain in effect (meaning this test can be used) for the duration of the COVID-19 declaration under Section 564(b)(1) of the Act, 21 U.S.C. section 360bbb-3(b)(1), unless the authorization is terminated or revoked sooner.  Performed at [facility], [address], [place], [place] [address]     ABX started Antibiotics Given (last 72 hours)     Date/Time Action Medication Dose Rate   02/22/23 1430 New Bag/Given   piperacillin-tazobactam (ZOSYN) IVPB 3.375 g 3.375 g 100 mL/hr       No results found for the last 90 days.     ________________________________________________________________  Arterial ***Venous  Blood Gas result:  pH *** pCO2 ***; pO2 ***;     %O2 Sat ***.  ABG     Component Value Date/Time   PHART 7.343 (L) 03/20/2022 1316   PCO2ART 33.4 03/20/2022 1316   PO2ART 87 03/20/2022 1316   HCO3 18.1 (L) 03/20/2022 1316   TCO2 19 (L) 03/20/2022 1316   ACIDBASEDEF 7.0 (H) 03/20/2022 1316   O2SAT 96 03/20/2022 1316       __________________________________________________________ Recent Labs  Lab 02/22/23 0957  NA 137  K 3.3*  CO2 30  GLUCOSE 157*  BUN 21  CREATININE 0.80  CALCIUM 8.8*    Cr  * stable,  Up from baseline see below Lab Results  Component Value Date   CREATININE 0.80 02/22/2023   CREATININE 0.86 01/27/2023   CREATININE 0.91 01/16/2023    Recent Labs  Lab 02/22/23 0957  AST 8*  ALT 6  ALKPHOS 39  BILITOT 0.7  PROT 6.6  ALBUMIN 3.8   Lab Results  Component Value Date   CALCIUM 8.8 (L) 02/22/2023   PHOS 2.7 03/22/2022          Plt: Lab Results  Component Value Date   PLT 304 02/22/2023         Recent Labs  Lab 02/22/23 0957  WBC 9.6  NEUTROABS 8.0*  HGB 10.9*  HCT 33.6*  MCV 85.9  PLT 304    HG/HCT * stable,  Down *Up from baseline see below    Component Value Date/Time  HGB 10.9 (L) 02/22/2023 0957   HGB 13.4 12/28/2012 0748   HCT 33.6 (L) 02/22/2023 0957   HCT 39.2 12/28/2012 0748   MCV 85.9 02/22/2023 0957   MCV 87 12/28/2012 0748      Recent Labs  Lab 02/22/23 0957  LIPASE 28   No results for input(s): "AMMONIA" in the last 168 hours.    .lab  _______________________________________________ Hospitalist was called for admission for *** Diverticulitis ***  Colovaginal fistula ***    The following Work up has been ordered so far:  Orders Placed This Encounter  Procedures   Culture, blood (routine x 2)   CT ABDOMEN PELVIS W CONTRAST   CBC with Differential   Comprehensive metabolic panel   Lipase, blood   Urinalysis, Routine w reflex microscopic -Urine, Catheterized   In and Out Cath   Cardiac Monitoring Continuous x 24 hours Indications for use: Other; other  indications for use: close monitoring, diverticulitis, cardaic history   Consult to general surgery   Consult to hospitalist   EKG   EKG   Insert peripheral IV   Place in observation (patient's expected length of stay will be less than 2 midnights)     OTHER Significant initial  Findings:  labs showing:     DM  labs:  HbA1C: Recent Labs    04/30/22 0158  HGBA1C 5.8*       CBG (last 3)  No results for input(s): "GLUCAP" in the last 72 hours.        Cultures:    Component Value Date/Time   SDES BLOOD LEFT ARM 04/28/2022 1258   SPECREQUEST  04/28/2022 1258    BOTTLES DRAWN AEROBIC AND ANAEROBIC Blood Culture results may not be optimal due to an excessive volume of blood received in culture bottles   CULT  04/28/2022 1258    NO GROWTH 5 DAYS Performed at [facility], [address], [place], [place] [address]    REPTSTATUS 05/03/2022 FINAL 04/28/2022 1258     Radiological Exams on Admission: CT ABDOMEN PELVIS W CONTRAST  Result Date: 02/22/2023 CLINICAL DATA:  LLQ abdominal pain notes passing stool from vagina, skinny stools, never had colonoscopy EXAM: CT ABDOMEN AND PELVIS WITH CONTRAST TECHNIQUE: Multidetector CT imaging of the abdomen and pelvis was performed using the standard protocol following bolus administration of intravenous contrast. RADIATION DOSE REDUCTION: This exam was performed according to the departmental dose-optimization program which includes automated exposure control, adjustment of the mA and/or kV according to patient size and/or use of iterative reconstruction technique. CONTRAST:  OMNIPAQUE IOHEXOL 300 MG/ML  SOLN COMPARISON:  CT scan pelvis from 10/06/2021. FINDINGS: Lower chest: There are patchy atelectatic changes in the visualized lung bases. No overt consolidation. No pleural effusion. There is a pleural-based 4.5 x 6.8 mm noncalcified nodule in the right lung lower lobe. Please see follow-up recommendations below. The heart is  normal in size. No pericardial effusion. Hepatobiliary: The liver is normal in size. Non-cirrhotic configuration. No suspicious mass. Note is made of a subcapsular 9 x 13 mm cyst in the left hepatic lobe, segment 2. There are several additional scattered subcentimeter hypoattenuating foci, which are too small to adequately characterize. No intrahepatic or extrahepatic bile duct dilation. No calcified gallstones. Normal gallbladder wall thickness. No pericholecystic inflammatory changes. Pancreas: Unremarkable. No pancreatic ductal dilatation or surrounding inflammatory changes. Spleen: Within normal limits. No focal lesion. Adrenals/Urinary Tract: Adrenal glands are unremarkable. No suspicious renal mass. There is a partially exophytic 2.6 x  2.9 cm cyst arising from the left kidney lower pole, laterally. No hydronephrosis. No renal or ureteric calculi. Unremarkable urinary bladder. Stomach/Bowel: There is an approximately 12 cm long segment of proximal sigmoid colon exhibiting moderate irregular wall thickening and mild pericolonic fat stranding on the background of several diverticula, compatible with acute diverticulitis. There is adjacent trace ascites, most likely reactive. No walled off abscess or loculated collection. No pneumoperitoneum. No disproportionate dilation of small or large bowel loops to suggest bowel obstruction. 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 The appendix was not visualized; however there is no acute inflammatory process in the right lower quadrant. Vascular/Lymphatic: Mild ascites mainly in the dependent pelvis and around the diverticulitis. No walled-off abscess. No pneumoperitoneum. No abdominal or pelvic lymphadenopathy, by size criteria. No aneurysmal dilation of the major abdominal arteries. There are mild peripheral  atherosclerotic vascular calcifications of the aorta and its major branches. Reproductive: Surgically absent uterus. There is small amount of air within the vagina. Other: The visualized soft tissues and abdominal wall are unremarkable. Musculoskeletal: No suspicious osseous lesions. There are mild multilevel degenerative changes in the visualized spine. IMPRESSION: 1. Acute proximal sigmoid diverticulitis. No walled off abscess or loculated collection. No pneumoperitoneum. Follow-up colonoscopy is recommended after the acute episode subsides to exclude underlying neoplastic process. 2. 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 3. There is a sub 6 mm (mean diameter), pleural-based right lung lower lobe solid pulmonary nodule. Please see follow-up recommendations below. Aortic Atherosclerosis (ICD10-I70.0). No follow-up needed if patient is low-risk (and has no known or suspected primary neoplasm). Non-contrast chest CT can be considered in 12 months if patient is high-risk. This recommendation follows the consensus statement: Guidelines for Management of Incidental Pulmonary Nodules Detected on CT Images: From the Fleischner Society 2017; Radiology 2017; 284:228-243. Electronically Signed   By: Jules Schick M.D.   On: 02/22/2023 13:32   _______________________________________________________________________________________________________ Latest  Blood pressure (!) 93/50, pulse 74, temperature 98.7 F (37.1 C), resp. rate 19, SpO2 100%.   Vitals  labs and radiology finding personally reviewed  Review of Systems:    Pertinent positives include: ***  Constitutional:  No weight loss, night sweats, Fevers, chills, fatigue, weight loss  HEENT:  No headaches, Difficulty swallowing,Tooth/dental problems,Sore throat,  No sneezing, itching, ear ache, nasal congestion, post nasal  drip,  Cardio-vascular:  No chest pain, Orthopnea, PND, anasarca, dizziness, palpitations.no Bilateral lower extremity swelling  GI:  No heartburn, indigestion, abdominal pain, nausea, vomiting, diarrhea, change in bowel habits, loss of appetite, melena, blood in stool, hematemesis Resp:  no shortness of breath at rest. No dyspnea on exertion, No excess mucus, no productive cough, No non-productive cough, No coughing up of blood.No change in color of mucus.No wheezing. Skin:  no rash or lesions. No jaundice GU:  no dysuria, change in color of urine, no urgency or frequency. No straining to urinate.  No flank pain.  Musculoskeletal:  No joint pain or no joint swelling. No decreased range of motion. No back pain.  Psych:  No change in mood or affect. No depression or anxiety. No memory loss.  Neuro: no localizing neurological complaints, no tingling, no weakness, no double vision, no gait abnormality, no slurred speech, no confusion  All systems reviewed and apart from  HOPI all are negative _______________________________________________________________________________________________ Past Medical History:   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    Concussion 10/04/2021   COPD (chronic obstructive pulmonary disease) (HCC)    Gout    Hypertension    Longstanding persistent atrial fibrillation (HCC)    on Xarelto   Nonischemic cardiomyopathy (HCC)    Obesity    Pacemaker    Spontaneous pneumothorax 2013      Past Surgical History:  Procedure Laterality Date   ABDOMINAL HYSTERECTOMY     APPENDECTOMY     ATRIAL FIBRILLATION ABLATION  07/20/2013   by Dr Christin Fudge   AV nodal ablation  11/01/2013   by Dr Christin Fudge, repeated by Dr Wilford Grist   BREAST BIOPSY Bilateral 1997   negative    CARDIAC CATHETERIZATION     CHOLECYSTECTOMY     HERNIA REPAIR     PACEMAKER INSERTION  06/2017   MDT Viva CRT-P implanted by Dr Christin Fudge after AV nodal ablation,  LV lead could not be placed   PACEMAKER INSERTION  05/2020   with lead bundle   RIGHT/LEFT HEART CATH AND CORONARY ANGIOGRAPHY N/A 03/20/2022   Procedure: RIGHT/LEFT HEART CATH AND CORONARY ANGIOGRAPHY;  Surgeon: [place]az[place], Peter M, MD;  Location: [facility] INVASIVE CV LAB;  Service: Cardiovascular;  Laterality: N/A;    Social History:  Ambulatory *** independently cane, walker  wheelchair bound, bed bound     reports that she quit smoking about 35 years ago. Her smoking use included cigarettes. She started smoking about 55 years ago. She has a 20 pack-year smoking history. She has been exposed to tobacco smoke. She has never used smokeless tobacco. She reports that she does not drink alcohol and does not use drugs.     Family History: *** Family History  Problem Relation Age of Onset   Breast cancer Mother 49       3 different times   Pancreatic cancer Mother    Emphysema Father    Healthy Brother    Breast cancer Cousin    Valvular heart disease Son    Obesity Daughter    Colon cancer Neg Hx    Esophageal cancer Neg Hx    Stomach cancer Neg Hx    Inflammatory bowel disease Neg Hx    Liver disease Neg Hx    Rectal cancer Neg Hx    ______________________________________________________________________________________________ Allergies: Allergies  Allergen Reactions   Allopurinol Other (See Comments)    Reaction:  Dizziness    Clindamycin Anaphylaxis and Hives   Flublok [Influenza Vaccine Recombinant] Other (See Comments)    Fever 103 with no alternative explanation day after vaccine.  Clydie Braun Highfill FNP-C   Pneumococcal 13-Val Conj Vacc Itching, Swelling and Rash   Dronedarone Rash   Montelukast Other (See Comments)    Makes her loopy.   Brovana [Arformoterol]     Caused muscle pain   Budesonide     Caused  extreme joint pain   Entresto [Sacubitril-Valsartan] Other (See Comments)    hypotension   Fosamax [Alendronate Sodium] Nausea Only   Jardiance [Empagliflozin]     Caused a vaginal infection   Meperidine Nausea And Vomiting   Microplegia Msa-Msg [Plegisol]     Loopy,diarrhea   Rosuvastatin Other (See Comments)    Reaction:  Muscle spasms  Tetracycline Hives   Adhesive [Tape] Rash   Lovastatin Rash and Other (See Comments)    Muscle Pain     Prior to Admission medications   Medication Sig Start Date End Date Taking? Authorizing Provider  acetaminophen (TYLENOL) 650 MG CR tablet Take 1,300 mg by mouth every 8 (eight) hours as needed for pain.   Yes [provider]  azelastine (ASTELIN) 0.1 % nasal spray Place 2 sprays into both nostrils 2 (two) times daily. 11/05/22  Yes Oretha Milch, MD  benzonatate (TESSALON) 200 MG capsule Take 1 capsule (200 mg total) by mouth 3 (three) times daily as needed. 02/12/23 02/12/24 Yes Parrett, Tammy S, NP  Cholecalciferol (VITAMIN D3) 50 MCG (2000 UT) TABS Take 2,000 Units by mouth at bedtime.   Yes [provider]  cyanocobalamin (VITAMIN B12) 1000 MCG/ML injection Inject 1 mL (1,000 mcg total) into the muscle every 30 (thirty) days. 05/05/22  Yes [name], MD  dextromethorphan (DELSYM) 30 MG/5ML liquid Take 10 mLs by mouth as needed for cough.   Yes [provider]  docusate sodium (COLACE) 100 MG capsule Take 100 mg by mouth 2 (two) times daily.   Yes [provider]  dofetilide (TIKOSYN) 250 MCG capsule Take 1 capsule (250 mcg total) by mouth in the morning and at bedtime. 12/01/22 12/01/23 Yes Fenton, Clint R, PA  febuxostat (ULORIC) 40 MG tablet TAKE 1 TABLET(40 MG) BY MOUTH DAILY 12/30/22  Yes Rice, Jamesetta Orleans, MD  fluticasone furoate-vilanterol (BREO ELLIPTA) 100-25 MCG/ACT AEPB Inhale 1 puff into the lungs daily. 07/30/22  Yes Cobb, Ruby Cola, NP  gabapentin (NEURONTIN) 100 MG capsule Take 2 capsules  (200 mg total) by mouth at bedtime. 11/03/22 06/01/23 Yes Camara, Amalia Hailey, MD  losartan (COZAAR) 25 MG tablet TAKE 1 TABLET(25 MG) BY MOUTH AT BEDTIME 12/07/22  Yes Bensimhon, [name], MD  Magnesium 500 MG TABS Take 500 mg by mouth at bedtime.   Yes [provider]  metolazone (ZAROXOLYN) 2.5 MG tablet Take 1 tablet (2.5 mg total) by mouth as needed (for swelling or SOB). MUST TAKE OF POTASSIUM WITH THIS 02/05/23  Yes Bensimhon, [name], MD  NEXLETOL 180 MG TABS TAKE 1 TABLET BY MOUTH DAILY 11/11/22  Yes [name], MD  potassium chloride (KLOR-CON) 10 MEQ tablet Take 1 tablet (10 mEq total) by mouth daily. 07/22/22 02/04/24 Yes Milford, [name], FNP  potassium chloride SA (KLOR-CON M) 20 MEQ tablet Take 20 mEq by mouth 2 (two) times daily as needed (when she takes super water pill).   Yes [provider]  revefenacin (YUPELRI) 175 MCG/3ML nebulizer solution Take 175 mcg by nebulization daily.   Yes [provider]  rivaroxaban (XARELTO) 20 MG TABS tablet Take 1 tablet (20 mg total) by mouth daily with supper. 02/01/23  Yes Bensimhon, [name], MD  spironolactone (ALDACTONE) 25 MG tablet TAKE 1 TABLET(25 MG) BY MOUTH EVERY MORNING 12/02/22  Yes Bensimhon, [name], MD  torsemide (DEMADEX) 20 MG tablet Take 1 tablet (20 mg total) by mouth daily. 07/22/22  Yes Milford, [name], FNP  levalbuterol Pauline Aus) 0.63 MG/3ML nebulizer solution Take 3 mLs (0.63 mg total) by nebulization every 6 (six) hours as needed for wheezing or shortness of breath. DX [id number] Patient not taking: Reported on 02/22/2023 01/20/23   Oretha Milch, MD  OXYGEN Inhale 5 L into the lungs continuous. Use with resmed ventilator    [provider]    ___________________________________________________________________________________________________ Physical  Exam:    02/22/2023    5:49 PM 02/22/2023    2:05 PM 02/22/2023   12:50 PM  Vitals with BMI  Systolic 93 119 95  Diastolic 50 65  56  Pulse 74 79 68     1. General:  in No ***Acute distress***increased work of breathing ***complaining of severe pain****agitated * Chronically ill *well *cachectic *toxic acutely ill -appearing 2. Psychological: Alert and *** Oriented 3. Head/ENT:   Moist *** Dry Mucous Membranes                          Head Non traumatic, neck supple                          Normal *** Poor Dentition 4. SKIN: normal *** decreased Skin turgor,  Skin clean Dry and intact no rash    5. Heart: Regular rate and rhythm no*** Murmur, no Rub or gallop 6. Lungs: ***Clear to auscultation bilaterally, no wheezes or crackles   7. Abdomen: Soft, ***non-tender, Non distended *** obese ***bowel sounds present 8. Lower extremities: no clubbing, cyanosis, no ***edema 9. Neurologically Grossly intact, moving all 4 extremities equally *** strength 5 out of 5 in all 4 extremities cranial nerves II through XII intact 10. MSK: Normal range of motion    Chart has been reviewed  ______________________________________________________________________________________________  Assessment/Plan  ***  Admitted for *** Diverticulitis ***  Colovaginal fistula ***    Present on Admission:  Diverticulitis     No problem-specific Assessment & Plan notes found for this encounter.    Other plan as per orders.  DVT prophylaxis:  SCD *** Lovenox       Code Status:    Code Status: Prior FULL CODE *** DNR/DNI ***comfort care as per patient ***family  I had personally discussed CODE STATUS with patient and family*  ACP *** none has been reviewed ***   Family Communication:   Family not at  Bedside  plan of care was discussed on the phone with *** Son, Daughter, Wife, Husband, Sister, Brother , father, mother  Diet    Disposition Plan:   *** likely will need placement for rehabilitation                          Back to current facility when stable                            To home once workup is complete  and patient is stable  ***Following barriers for discharge:                             Chest pain *** Stroke *** work up is complete                            Electrolytes corrected                               Anemia corrected h/H stable                             Pain controlled with PO medications  Afebrile, white count improving able to transition to PO antibiotics                             Will need to be able to tolerate PO                            Will likely need home health, home O2, set up                           Will need consultants to evaluate patient prior to discharge       Consult Orders  (From admission, onward)           Start     Ordered   02/22/23 1423  Consult to hospitalist  Charlie at [facility] will have Hospitalist Consult with PA-Murphy.-ABB(NS)  Once       Provider:  (Not yet assigned)  Question Answer Comment  Place call to: [facility]   Reason for Consult Admit      02/22/23 1422                              ***Would benefit from PT/OT eval prior to DC  Ordered                   Swallow eval - SLP ordered                   Diabetes care coordinator                   Transition of care consulted                   Nutrition    consulted                  Wound care  consulted                   Palliative care    consulted                   Behavioral health  consulted                    Consults called: ***     Admission status:  ED Disposition     ED Disposition  Admit   Condition  --   Comment  Hospital Area: [facility] [[id number]]  Level of Care: Telemetry Surgical [105]  Interfacility transfer: Yes  May place patient in observation at [facility] or [facility] if equivalent level of care is available:: No  Covid Evaluation: Asymptomatic - no recent exposure (last 10 days) testing not required  Diagnosis: Diverticulitis [194106]  Admitting Physician: Synetta Fail  [[id number]]  Attending Physician: Synetta Fail [[id number]]           Obs***  ***  inpatient     I Expect 2 midnight stay secondary to severity of patient's current illness need for inpatient interventions justified by the following: ***hemodynamic instability despite optimal treatment (tachycardia *hypotension * tachypnea *hypoxia, hypercapnia) * Severe lab/radiological/exam abnormalities including:     and extensive comorbidities including: *substance abuse  *Chronic pain *DM2  * CHF * CAD  * COPD/asthma *Morbid Obesity * CKD *dementia *liver disease *history of stroke  with residual deficits *  malignancy, * sickle cell disease  History of amputation Chronic anticoagulation  That are currently affecting medical management.   I expect  patient to be hospitalized for 2 midnights requiring inpatient medical care.  Patient is at high risk for adverse outcome (such as loss of life or disability) if not treated.  Indication for inpatient stay as follows:  Severe change from baseline regarding mental status Hemodynamic instability despite maximal medical therapy,  ongoing suicidal ideations,  severe pain requiring acute inpatient management,  inability to maintain oral hydration   persistent chest pain despite medical management Need for operative/procedural  intervention New or worsening hypoxia   Need for IV antibiotics, IV fluids, IV rate controling medications, IV antihypertensives, IV pain medications, IV anticoagulation, need for biPAP    Level of care   *** tele  For 12H 24H     medical floor       progressive     stepdown   tele indefinitely please discontinue once patient no longer qualifies COVID-19 Labs    Lab Results  Component Value Date   SARSCOV2NAA NEGATIVE 01/13/2023     Precautions: admitted as *** Covid Negative  ***asymptomatic screening protocol****PUI *** covid positive No active isolations ***If Covid PCR is negative  - please DC  precautions - would need additional investigation given very high risk for false native test result        Musab Wingard 02/22/2023, 7:09 PM ***  [facility]     after 2 AM please page floor coverage PA If 7AM-7PM, please contact the day team taking care of the patient using [email or web address]

## 2023-02-22 NOTE — Assessment & Plan Note (Signed)
Continue BiPAP.

## 2023-02-22 NOTE — Assessment & Plan Note (Signed)
Monitor sliding scale

## 2023-02-22 NOTE — Progress Notes (Signed)
Doutova, MD made aware that pt arrived to 6 north room 11

## 2023-02-22 NOTE — Subjective & Objective (Signed)
1 week of foul brown drainage from vagina getting worse now and she needs to wear panty liner at night slimy brown mucus discharge with some red tent also associated left lower quadrant abdominal pain Never had a colonoscopy in has been somewhat constipated has tried to use sorbitol for constipation passing skinny stools History of COPD on 4 to 5 L of O2 at baseline Longstanding lung disease 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 Started on Zosyn and general surgery was consulted recommend admit to internal medicine and call back if needed

## 2023-02-22 NOTE — Assessment & Plan Note (Signed)
Discussed with general surgery did not recommend follow-up with colorectal surgeon as an outpatient for now treat for diverticulitis. General surgery to facilitate scheduling follow-up

## 2023-02-22 NOTE — ED Provider Notes (Signed)
Kathleen [name] AT [facility] Provider Note   CSN: [id number] Arrival date & time: 02/22/23  5621     History  No chief complaint on file.   Kathleen [name] is a 77 y.o. female.  77 year old female with complaint of passing stools/brown slimy substance from the vagina for at least the past week, progressively worsening and now having to put chux pads on her bed at night. Notes long history of LLQ abdominal pain and constipation. Tried sorbitol once for her constipation and passed skinny stools. Has never had a colonoscopy. Has been on multiple abx for a lung infection and recently completed 7 day course of Augmentin (not resulting in diarrhea). On 4 L [place] at baseline, 5L for activity due to COPD/Asthma. Also history of cheilitis obliterans with organizing pneumonia, longstanding persistent A-fib, complete heart block treated with AV nodal ablation, chronic renal insufficiency, CHF, hypertension, concussion.       Home Medications Prior to Admission medications   Medication Sig Start Date End Date Taking? Authorizing Provider  acetaminophen (TYLENOL) 650 MG CR tablet Take 1,300 mg by mouth every 8 (eight) hours as needed for pain.    [provider]  amoxicillin-clavulanate (AUGMENTIN) 875-125 MG tablet Take 1 tablet by mouth 2 (two) times daily. 02/12/23   Parrett, Virgel Bouquet, NP  azelastine (ASTELIN) 0.1 % nasal spray Place 2 sprays into both nostrils 2 (two) times daily. 11/05/22   Oretha Milch, MD  benzonatate (TESSALON) 200 MG capsule Take 1 capsule (200 mg total) by mouth 3 (three) times daily as needed. 02/12/23 02/12/24  Parrett, Virgel Bouquet, NP  chlorpheniramine-HYDROcodone (TUSSIONEX) 10-8 MG/5ML Take 5 mLs by mouth at bedtime as needed for cough. 01/19/23   Burnadette Pop, MD  Cholecalciferol (VITAMIN D3) 50 MCG (2000 UT) TABS Take 2,000 Units by mouth at bedtime.    [provider]  cyanocobalamin (VITAMIN B12) 1000 MCG/ML injection Inject 1 mL  (1,000 mcg total) into the muscle every 30 (thirty) days. 05/05/22   [name], MD  docusate sodium (COLACE) 100 MG capsule Take 100 mg by mouth 2 (two) times daily.    [provider]  dofetilide (TIKOSYN) 250 MCG capsule Take 1 capsule (250 mcg total) by mouth in the morning and at bedtime. 12/01/22 12/01/23  Fenton, Clint R, PA  febuxostat (ULORIC) 40 MG tablet TAKE 1 TABLET(40 MG) BY MOUTH DAILY 12/30/22   Rice, Jamesetta Orleans, MD  fluticasone furoate-vilanterol (BREO ELLIPTA) 100-25 MCG/ACT AEPB Inhale 1 puff into the lungs daily. 07/30/22   Cobb, Ruby Cola, NP  gabapentin (NEURONTIN) 100 MG capsule Take 2 capsules (200 mg total) by mouth at bedtime. 11/03/22 06/01/23  Windell Norfolk, MD  guaifenesin (HUMIBID E) 400 MG TABS tablet Take 1 tablet (400 mg total) by mouth every 4 (four) hours as needed. 01/19/23   Burnadette Pop, MD  levalbuterol Pauline Aus) 0.63 MG/3ML nebulizer solution Take 3 mLs (0.63 mg total) by nebulization every 6 (six) hours as needed for wheezing or shortness of breath. DX J44.89 J96.21 01/20/23   Oretha Milch, MD  losartan (COZAAR) 25 MG tablet TAKE 1 TABLET(25 MG) BY MOUTH AT BEDTIME 12/07/22   Bensimhon, [name], MD  Magnesium 500 MG TABS Take 500 mg by mouth at bedtime.    [provider]  metolazone (ZAROXOLYN) 2.5 MG tablet Take 1 tablet (2.5 mg total) by mouth as needed (for swelling or SOB). MUST TAKE OF POTASSIUM WITH THIS 02/05/23   Bensimhon,  [name], MD  NEXLETOL 180 MG TABS TAKE 1 TABLET BY MOUTH DAILY 11/11/22   [name], MD  OXYGEN Inhale 5 L into the lungs continuous. Use with resmed ventilator    [provider]  potassium chloride (KLOR-CON) 10 MEQ tablet Take 1 tablet (10 mEq total) by mouth daily. 07/22/22 02/04/24  Jacklynn Ganong, FNP  predniSONE (DELTASONE) 20 MG tablet Take 1 tablet (20 mg total) by mouth daily with breakfast. 02/12/23   Parrett, Virgel Bouquet, NP  rivaroxaban (XARELTO) 20 MG TABS tablet Take 1 tablet  (20 mg total) by mouth daily with supper. 02/01/23   Bensimhon, [name], MD  spironolactone (ALDACTONE) 25 MG tablet TAKE 1 TABLET(25 MG) BY MOUTH EVERY MORNING 12/02/22   Bensimhon, [name], MD  torsemide (DEMADEX) 20 MG tablet Take 1 tablet (20 mg total) by mouth daily. 07/22/22   Jacklynn Ganong, FNP      Allergies    Allopurinol, Clindamycin, Flublok [influenza vaccine recombinant], Pneumococcal 13-val conj vacc, Dronedarone, Montelukast, Brovana [arformoterol], Budesonide, Entresto [sacubitril-valsartan], Fosamax [alendronate sodium], Jardiance [empagliflozin], Meperidine, Microplegia msa-msg [plegisol], Rosuvastatin, Tetracycline, Adhesive [tape], and Lovastatin    Review of Systems   Review of Systems Negative except as per HPI Physical Exam Updated Vital Signs BP 119/65   Pulse 79   Temp 99.1 F (37.3 C) (Oral)   Resp 20   SpO2 100%  Physical Exam  ED Results / Procedures / Treatments   Labs (all labs ordered are listed, but only abnormal results are displayed) Labs Reviewed  CBC WITH DIFFERENTIAL/PLATELET - Abnormal; Notable for the following components:      Result Value   Hemoglobin 10.9 (*)    HCT 33.6 (*)    RDW 15.9 (*)    Neutro Abs 8.0 (*)    Abs Immature Granulocytes 0.10 (*)    All other components within normal limits  COMPREHENSIVE METABOLIC PANEL - Abnormal; Notable for the following components:   Potassium 3.3 (*)    Chloride 96 (*)    Glucose, Bld 157 (*)    Calcium 8.8 (*)    AST 8 (*)    All other components within normal limits  CULTURE, BLOOD (ROUTINE X 2)  CULTURE, BLOOD (ROUTINE X 2)  LIPASE, BLOOD  URINALYSIS, ROUTINE W REFLEX MICROSCOPIC    EKG None  Radiology CT ABDOMEN PELVIS W CONTRAST  Result Date: 02/22/2023 CLINICAL DATA:  LLQ abdominal pain notes passing stool from vagina, skinny stools, never had colonoscopy EXAM: CT ABDOMEN AND PELVIS WITH CONTRAST TECHNIQUE: Multidetector CT imaging of the abdomen and pelvis was performed  using the standard protocol following bolus administration of intravenous contrast. RADIATION DOSE REDUCTION: This exam was performed according to the departmental dose-optimization program which includes automated exposure control, adjustment of the mA and/or kV according to patient size and/or use of iterative reconstruction technique. CONTRAST:  OMNIPAQUE IOHEXOL 300 MG/ML  SOLN COMPARISON:  CT scan pelvis from 10/06/2021. FINDINGS: Lower chest: There are patchy atelectatic changes in the visualized lung bases. No overt consolidation. No pleural effusion. There is a pleural-based 4.5 x 6.8 mm noncalcified nodule in the right lung lower lobe. Please see follow-up recommendations below. The heart is normal in size. No pericardial effusion. Hepatobiliary: The liver is normal in size. Non-cirrhotic configuration. No suspicious mass. Note is made of a subcapsular 9 x 13 mm cyst in the left hepatic lobe, segment 2. There are several additional scattered subcentimeter hypoattenuating foci, which are too small to adequately  characterize. No intrahepatic or extrahepatic bile duct dilation. No calcified gallstones. Normal gallbladder wall thickness. No pericholecystic inflammatory changes. Pancreas: Unremarkable. No pancreatic ductal dilatation or surrounding inflammatory changes. Spleen: Within normal limits. No focal lesion. Adrenals/Urinary Tract: Adrenal glands are unremarkable. No suspicious renal mass. There is a partially exophytic 2.6 x 2.9 cm cyst arising from the left kidney lower pole, laterally. No hydronephrosis. No renal or ureteric calculi. Unremarkable urinary bladder. Stomach/Bowel: There is an approximately 12 cm long segment of proximal sigmoid colon exhibiting moderate irregular wall thickening and mild pericolonic fat stranding on the background of several diverticula, compatible with acute diverticulitis. There is adjacent trace ascites, most likely reactive. No walled off abscess or loculated  collection. No pneumoperitoneum. No disproportionate dilation of small or large bowel loops to suggest bowel obstruction. 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 The appendix was not visualized; however there is no acute inflammatory process in the right lower quadrant. Vascular/Lymphatic: Mild ascites mainly in the dependent pelvis and around the diverticulitis. No walled-off abscess. No pneumoperitoneum. No abdominal or pelvic lymphadenopathy, by size criteria. No aneurysmal dilation of the major abdominal arteries. There are mild peripheral atherosclerotic vascular calcifications of the aorta and its major branches. Reproductive: Surgically absent uterus. There is small amount of air within the vagina. Other: The visualized soft tissues and abdominal wall are unremarkable. Musculoskeletal: No suspicious osseous lesions. There are mild multilevel degenerative changes in the visualized spine. IMPRESSION: 1. Acute proximal sigmoid diverticulitis. No walled off abscess or loculated collection. No pneumoperitoneum. Follow-up colonoscopy is recommended after the acute episode subsides to exclude underlying neoplastic process. 2. 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 3. There is a sub 6 mm (mean diameter), pleural-based right lung lower lobe solid pulmonary nodule. Please see follow-up recommendations below. Aortic Atherosclerosis (ICD10-I70.0). No follow-up needed if patient is low-risk (and has no known or suspected primary neoplasm). Non-contrast chest CT can be considered in 12 months if patient is high-risk. This recommendation follows the consensus statement: Guidelines for Management of Incidental Pulmonary Nodules  Detected on CT Images: From the Fleischner Society 2017; Radiology 2017; 284:228-243. Electronically Signed   By: Jules Schick M.D.   On: 02/22/2023 13:32    Procedures Procedures    Medications Ordered in ED Medications  piperacillin-tazobactam (ZOSYN) IVPB 3.375 g (has no administration in time range)  iohexol (OMNIPAQUE) 300 MG/ML solution 100 mL (100 mLs Intravenous Contrast Given 02/22/23 1148)  piperacillin-tazobactam (ZOSYN) IVPB 3.375 g (0 g Intravenous Stopped 02/22/23 1509)    ED Course/ Medical Decision Making/ A&P                                 Medical Decision Making Amount and/or Complexity of Data Reviewed Labs: ordered. Radiology: ordered.  Risk Prescription drug management. Decision regarding hospitalization.   This patient presents to the ED for concern of passing stool from vagina, this involves an extensive number of treatment options, and is a complaint that carries with it a high risk of complications and morbidity.  The differential diagnosis includes but not limited to diverticulitis, fistula, mass   Co morbidities that complicate the patient evaluation  CHF with a EF of 35 to 40% on  prior echo.  Hypertension, A-fib (on Xarelto).  Hypertension, chronic renal insufficiency, history of heart block status post ablation, pacemaker, COPD.   Additional history obtained:  Additional history obtained from son at bedside who contributes to history as above External records from outside source obtained and reviewed including prior labs on file for comparison   Lab Tests:  I Ordered, and personally interpreted labs.  The pertinent results include: CBC with hemoglobin 10.9, not significantly changed compared to prior on file.  WBC normal.  CMP with mild hypokalemia with potassium of 3.3.  Urinalysis is unremarkable, catheterized sample.  Lipase normal.   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  Case discussed with Dr. Alinda Money with [facility] who will request bed for admission.   Problem List / ED Course / Critical interventions / Medication management  77 year old female presents emergency room with concern for passing stool from her vagina for the past week, progressively worsening.  States that she has had left lower quadrant pain ongoing for some time now.  States that she is constipated at baseline, has passed skinnier stools of taking sorbitol once in the past, has never had a colonoscopy.  Found to have mild anemia as well as mild hypokalemia otherwise labs without significant findings, white count normal.  Patient is afebrile.  Did have a few soft reading blood pressures however a proper cuff was placed in her blood pressure is normal, currently 119/65.  CT reveals diverticulitis without perforation, will treat with Zosyn.  CT also with colovaginal fistula.  Case was discussed with general surgery who recommends admission to hospitalist service.  Surgery is available for consult at request of hospital team while admitted.  Discussed results and plan of care with patient who verbalizes understanding. I ordered medication including Zosyn for intra abdominal infection Reevaluation of the patient after these medicines showed that the patient stayed the same I have reviewed the patients home medicines and have made adjustments as needed   Social Determinants of  Health:  Has PCP   Test / Admission - Considered:  Admit          Final Clinical Impression(s) / ED Diagnoses Final diagnoses:  Diverticulitis  Colovaginal fistula    Rx / DC Orders ED Discharge Orders     None         Jeannie Fend, PA-C 02/22/23 1520    Benjiman Core, MD 02/22/23 1541

## 2023-02-22 NOTE — ED Notes (Addendum)
[facility] at bedside to transport pt to [facility]. This RN attempted to give report x3. Floor to call back for report

## 2023-02-22 NOTE — Assessment & Plan Note (Signed)
Chronic stable secondary to COPD/Boop continue home O2 BiPAP at nighttime

## 2023-02-22 NOTE — ED Notes (Signed)
Rhodia Acres RN and Alyssa NT present for in and out cath, completed without incident.

## 2023-02-22 NOTE — Assessment & Plan Note (Addendum)
Continue Xarelto.  General surgery patient needs to follow-up as an outpatient for her   colo vaginal fistula.  With colorectal specialist Continue Tikosyn per pharmacy appreciate the help with the management

## 2023-02-22 NOTE — Assessment & Plan Note (Signed)
Chronic stable continue home medications ?

## 2023-02-22 NOTE — Assessment & Plan Note (Signed)
Appears somewhat urinary melenic perhaps getting a bit more on the dry side.  Continue torsemide if able to tolerate or avoid over aggressive fluid resuscitation continue Tikosyn once potassium repleted appreciate pharmacy consult help

## 2023-02-22 NOTE — Progress Notes (Signed)
Pt arrived to 6 north room 11 alert and oriented x4. Pain level 3/10. Bed in lowest position. Call light in reach. Will continue to monitor pt

## 2023-02-22 NOTE — Assessment & Plan Note (Signed)
-  chronic avoid nephrotoxic medications such as NSAIDs, Vanco Zosyn combo,  avoid hypotension, continue to follow renal function

## 2023-02-22 NOTE — Assessment & Plan Note (Signed)
Soft blood pressures today will allow some permissive hypertension for now

## 2023-02-23 ENCOUNTER — Ambulatory Visit (HOSPITAL_BASED_OUTPATIENT_CLINIC_OR_DEPARTMENT_OTHER): Payer: Medicare Other | Admitting: Pulmonary Disease

## 2023-02-23 DIAGNOSIS — E1122 Type 2 diabetes mellitus with diabetic chronic kidney disease: Secondary | ICD-10-CM | POA: Diagnosis present

## 2023-02-23 DIAGNOSIS — D631 Anemia in chronic kidney disease: Secondary | ICD-10-CM | POA: Diagnosis present

## 2023-02-23 DIAGNOSIS — K5792 Diverticulitis of intestine, part unspecified, without perforation or abscess without bleeding: Secondary | ICD-10-CM | POA: Diagnosis not present

## 2023-02-23 DIAGNOSIS — Z9981 Dependence on supplemental oxygen: Secondary | ICD-10-CM | POA: Diagnosis not present

## 2023-02-23 DIAGNOSIS — Z881 Allergy status to other antibiotic agents status: Secondary | ICD-10-CM | POA: Diagnosis not present

## 2023-02-23 DIAGNOSIS — E876 Hypokalemia: Secondary | ICD-10-CM | POA: Diagnosis present

## 2023-02-23 DIAGNOSIS — Z87820 Personal history of traumatic brain injury: Secondary | ICD-10-CM | POA: Diagnosis not present

## 2023-02-23 DIAGNOSIS — N823 Fistula of vagina to large intestine: Secondary | ICD-10-CM | POA: Diagnosis present

## 2023-02-23 DIAGNOSIS — Z950 Presence of cardiac pacemaker: Secondary | ICD-10-CM | POA: Diagnosis not present

## 2023-02-23 DIAGNOSIS — E1140 Type 2 diabetes mellitus with diabetic neuropathy, unspecified: Secondary | ICD-10-CM | POA: Diagnosis present

## 2023-02-23 DIAGNOSIS — G4730 Sleep apnea, unspecified: Secondary | ICD-10-CM | POA: Diagnosis present

## 2023-02-23 DIAGNOSIS — I4811 Longstanding persistent atrial fibrillation: Secondary | ICD-10-CM | POA: Diagnosis present

## 2023-02-23 DIAGNOSIS — J9611 Chronic respiratory failure with hypoxia: Secondary | ICD-10-CM | POA: Diagnosis present

## 2023-02-23 DIAGNOSIS — M25511 Pain in right shoulder: Secondary | ICD-10-CM | POA: Diagnosis present

## 2023-02-23 DIAGNOSIS — Z90710 Acquired absence of both cervix and uterus: Secondary | ICD-10-CM | POA: Diagnosis not present

## 2023-02-23 DIAGNOSIS — I428 Other cardiomyopathies: Secondary | ICD-10-CM | POA: Diagnosis present

## 2023-02-23 DIAGNOSIS — Z87891 Personal history of nicotine dependence: Secondary | ICD-10-CM | POA: Diagnosis not present

## 2023-02-23 DIAGNOSIS — I5022 Chronic systolic (congestive) heart failure: Secondary | ICD-10-CM | POA: Diagnosis present

## 2023-02-23 DIAGNOSIS — I130 Hypertensive heart and chronic kidney disease with heart failure and stage 1 through stage 4 chronic kidney disease, or unspecified chronic kidney disease: Secondary | ICD-10-CM | POA: Diagnosis present

## 2023-02-23 DIAGNOSIS — N824 Other female intestinal-genital tract fistulae: Secondary | ICD-10-CM | POA: Diagnosis not present

## 2023-02-23 DIAGNOSIS — Z825 Family history of asthma and other chronic lower respiratory diseases: Secondary | ICD-10-CM | POA: Diagnosis not present

## 2023-02-23 DIAGNOSIS — K5900 Constipation, unspecified: Secondary | ICD-10-CM | POA: Diagnosis present

## 2023-02-23 DIAGNOSIS — N1831 Chronic kidney disease, stage 3a: Secondary | ICD-10-CM | POA: Diagnosis present

## 2023-02-23 DIAGNOSIS — N898 Other specified noninflammatory disorders of vagina: Secondary | ICD-10-CM | POA: Diagnosis present

## 2023-02-23 DIAGNOSIS — J4489 Other specified chronic obstructive pulmonary disease: Secondary | ICD-10-CM | POA: Diagnosis present

## 2023-02-23 DIAGNOSIS — K5732 Diverticulitis of large intestine without perforation or abscess without bleeding: Secondary | ICD-10-CM | POA: Diagnosis present

## 2023-02-23 LAB — GLUCOSE, CAPILLARY
Glucose-Capillary: 106 mg/dL — ABNORMAL HIGH (ref 70–99)
Glucose-Capillary: 122 mg/dL — ABNORMAL HIGH (ref 70–99)
Glucose-Capillary: 125 mg/dL — ABNORMAL HIGH (ref 70–99)
Glucose-Capillary: 127 mg/dL — ABNORMAL HIGH (ref 70–99)
Glucose-Capillary: 158 mg/dL — ABNORMAL HIGH (ref 70–99)
Glucose-Capillary: 179 mg/dL — ABNORMAL HIGH (ref 70–99)

## 2023-02-23 LAB — COMPREHENSIVE METABOLIC PANEL
ALT: 10 U/L (ref 0–44)
AST: 11 U/L — ABNORMAL LOW (ref 15–41)
Albumin: 2.9 g/dL — ABNORMAL LOW (ref 3.5–5.0)
Alkaline Phosphatase: 34 U/L — ABNORMAL LOW (ref 38–126)
Anion gap: 11 (ref 5–15)
BUN: 15 mg/dL (ref 8–23)
CO2: 24 mmol/L (ref 22–32)
Calcium: 8.1 mg/dL — ABNORMAL LOW (ref 8.9–10.3)
Chloride: 97 mmol/L — ABNORMAL LOW (ref 98–111)
Creatinine, Ser: 0.9 mg/dL (ref 0.44–1.00)
GFR, Estimated: >60 mL/min (ref >60)
Glucose, Bld: 113 mg/dL — ABNORMAL HIGH (ref 70–99)
Potassium: 4.1 mmol/L (ref 3.5–5.1)
Sodium: 132 mmol/L — ABNORMAL LOW (ref 135–145)
Total Bilirubin: 1.4 mg/dL — ABNORMAL HIGH (ref 0.3–1.2)
Total Protein: 5.8 g/dL — ABNORMAL LOW (ref 6.5–8.1)

## 2023-02-23 LAB — MAGNESIUM: Magnesium: 2 mg/dL (ref 1.7–2.4)

## 2023-02-23 LAB — CBC
HCT: 30.8 % — ABNORMAL LOW (ref 36.0–46.0)
Hemoglobin: 10.1 g/dL — ABNORMAL LOW (ref 12.0–15.0)
MCH: 28.4 pg (ref 26.0–34.0)
MCHC: 32.8 g/dL (ref 30.0–36.0)
MCV: 86.5 fL (ref 80.0–100.0)
Platelets: 272 10*3/uL (ref 150–400)
RBC: 3.56 MIL/uL — ABNORMAL LOW (ref 3.87–5.11)
RDW: 15.6 % — ABNORMAL HIGH (ref 11.5–15.5)
WBC: 8.5 10*3/uL (ref 4.0–10.5)
nRBC: 0 % (ref 0.0–0.2)

## 2023-02-23 LAB — MRSA NEXT GEN BY PCR, NASAL: MRSA by PCR Next Gen: NOT DETECTED

## 2023-02-23 LAB — PHOSPHORUS: Phosphorus: 1.9 mg/dL — ABNORMAL LOW (ref 2.5–4.6)

## 2023-02-23 MED ORDER — POTASSIUM PHOSPHATES 15 MMOLE/5ML IV SOLN
15.0000 mmol | Freq: Once | INTRAVENOUS | Status: AC
  Administered 2023-02-23: 15 mmol via INTRAVENOUS
  Filled 2023-02-23: qty 5

## 2023-02-23 MED ORDER — ENSURE ENLIVE PO LIQD
237.0000 mL | Freq: Two times a day (BID) | ORAL | Status: DC
  Administered 2023-02-23 – 2023-02-24 (×4): 237 mL via ORAL

## 2023-02-23 MED ORDER — SODIUM CHLORIDE 0.9 % IV BOLUS
1000.0000 mL | Freq: Once | INTRAVENOUS | Status: AC
  Administered 2023-02-23: 1000 mL via INTRAVENOUS

## 2023-02-23 NOTE — Assessment & Plan Note (Signed)
-   will replace electrolytes and repeat  check Mg, phos and Ca level and replace as needed Monitor on telemetry   Lab Results  Component Value Date   K 3.1 (L) 02/22/2023     Lab Results  Component Value Date   CREATININE 0.91 02/22/2023   Lab Results  Component Value Date   MG 2.3 02/22/2023   Lab Results  Component Value Date   CALCIUM 8.9 02/22/2023   PHOS 2.3 (L) 02/22/2023

## 2023-02-23 NOTE — Assessment & Plan Note (Signed)
Hx of rotator cuff injury chronic Pain control

## 2023-02-23 NOTE — Progress Notes (Signed)
   02/23/23 1556  Assess: MEWS Score  Temp 98.2 F (36.8 C)  BP (!) 68/47 (recheck)  MAP (mmHg) (!) 53  Pulse Rate 70  Level of Consciousness Alert  SpO2 100 %  O2 Device Nasal Cannula  O2 Flow Rate (L/min) 3 L/min  Assess: MEWS Score  MEWS Temp 0  MEWS Systolic 3  MEWS Pulse 0  MEWS RR 0  MEWS LOC 0  MEWS Score 3  MEWS Score Color Yellow  Assess: if the MEWS score is Yellow or Red  Were vital signs accurate and taken at a resting state? Yes  Does the patient meet 2 or more of the SIRS criteria? No  MEWS guidelines implemented  Yes, yellow  Treat  MEWS Interventions Considered administering scheduled or prn medications/treatments as ordered  Take Vital Signs  Increase Vital Sign Frequency  Yellow: Q2hr x1, continue Q4hrs until patient remains green for 12hrs  Escalate  MEWS: Escalate Yellow: Discuss with charge nurse and consider notifying provider and/or RRT  Notify: Charge Nurse/RN  Name of Charge Nurse/RN Notified Miriam,RN  Provider Notification  Provider Name/Title Gherghe,MD  Date Provider Notified 02/23/23  Time Provider Notified 1619  Method of Notification Page  Notification Reason Other (Comment) (Yellow MEWS)  Provider response See new orders (blous)  Date of Provider Response 02/23/23  Time of Provider Response 1621  Assess: SIRS CRITERIA  SIRS Temperature  0  SIRS Pulse 0  SIRS Respirations  0  SIRS WBC 0  SIRS Score Sum  0

## 2023-02-23 NOTE — Progress Notes (Signed)
Called yesterday by ED regarding a CT scan showing uncomplicated diverticulitis with a colovaginal fistula and some concern for CHF exacerbation.  We recommended routine abx therapy for her diverticulitis and outpatient follow up with a colorectal surgeon for her colovaginal fistula.  It appears she got admitted for medical reasons.  We will arrange follow up for her as an outpatient, but no acute surgical needs are warranted during her inpatient stay at this time.  Letha Cape 9:11 AM 02/23/2023

## 2023-02-23 NOTE — TOC Initial Note (Signed)
Transition of Care (TOC) - Initial/Assessment Note   Patient from [facility] . She was receiving PT there prior to admission.   She is active with palliative care through [facility]   She has oxygen through [employer] and has a portable tank.   If she discharges prior to weekend son can provide transportation home.   Has a rollator  Patient Details  Name: Kathleen [name] MRN: [id number] Date of Birth: 1946-05-19  Transition of Care [facility]) CM/SW Contact:    Kingsley Plan, RN Phone Number: 02/23/2023, 2:50 PM  Clinical Narrative:                   Expected Discharge Plan:  (await PT eval) Barriers to Discharge: Continued Medical Work up   Patient Goals and CMS Choice Patient states their goals for this hospitalization and ongoing recovery are:: to return to home          Expected Discharge Plan and Services   Discharge Planning Services: CM Consult                     DME Arranged: N/A                    Prior Living Arrangements/Services   Lives with:: Self Patient language and need for interpreter reviewed:: Yes        Need for Family Participation in Patient Care: Yes (Comment)   Current home services: Home PT Criminal Activity/Legal Involvement Pertinent to Current Situation/Hospitalization: No - Comment as needed  Activities of Daily Living      Permission Sought/Granted   Permission granted to share information with : No              Emotional Assessment Appearance:: Appears stated age Attitude/Demeanor/Rapport: Engaged Affect (typically observed): Accepting Orientation: : Oriented to Self, Oriented to Place, Oriented to  Time, Oriented to Situation Alcohol / Substance Use: Not Applicable Psych Involvement: No (comment)  Admission diagnosis:  Diverticulitis [K57.92] 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] Patient Active Problem List   Diagnosis Date Noted   Hypokalemia 02/23/2023   Diverticulitis 02/22/2023   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   Carpal tunnel syndrome, right upper limb 01/27/2023   Acute exacerbation of chronic obstructive pulmonary disease (COPD) (HCC) 01/13/2023   Dysuria 11/26/2022   Hypercoagulable state due to persistent atrial fibrillation (HCC) 11/18/2022   Acute on chronic heart failure with preserved ejection fraction (HCC) 10/26/2022   COPD with acute exacerbation (HCC) 10/09/2022   Pain in left shoulder 07/23/2022   Chronic obstructive pulmonary disease (HCC) 06/28/2022   Palliative care encounter 06/28/2022   Post-viral cough syndrome 06/02/2022   Paranasal sinus disease 05/19/2022   Heart failure with reduced ejection fraction (HCC) 05/14/2022   Pneumonia 04/29/2022   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   Depression, major, single episode, moderate (HCC) 04/27/2022   Non-STEMI (non-ST elevated myocardial infarction) (HCC) 03/18/2022   Morbid obesity (HCC) 03/18/2022   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   Low TSH level 12/02/2021   Acute idiopathic gout of left hand 11/19/2021   Acquired hypothyroidism 11/12/2021   Encounter for palliative care involving management of pain 11/12/2021   Post concussive syndrome 11/11/2021   Chronic left-sided low back pain 11/04/2021   Gout 10/01/2021   Hyperlipidemia with target LDL less than 100 08/07/2021   Chronic anticoagulation 07/07/2021   Stage 3a chronic kidney disease (HCC) 05/23/2021   Dietary iron deficiency 05/23/2021   Age-related osteoporosis without current pathological fracture 04/22/2021   Type  2 diabetes mellitus with diabetic neuropathy, without long-term current use of insulin (HCC) 04/22/2021   Sleep apnea 01/22/2021   Statin intolerance 10/12/2019   Constipation    Statin myopathy 02/24/2019   Chronic systolic heart failure (HCC) 04/06/2018   HTN (hypertension) 04/06/2018   Atrial fibrillation (HCC) 04/06/2018   Chronic respiratory failure with hypoxia (HCC) 03/28/2018   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 [place] [address] Phone: [phone] Fax: [phone]  [facility] - [place], [place] - [address] [facility] [place]. [address] [employer]. [address] [place] [address] Phone: [phone] Fax: [phone]  [facility] [address] [place] [address] Phone: [phone] Fax: [phone]     Social Determinants of Health (SDOH) Social History: SDOH Screenings   Food Insecurity: No Food Insecurity (01/20/2023)  Housing: Low Risk  (01/14/2023)  Transportation Needs: No Transportation Needs (01/20/2023)  Utilities: Not At Risk (01/14/2023)  Alcohol Screen: Low Risk  (05/21/2022)  Depression (PHQ2-9): Low Risk  (05/21/2022)  Financial Resource Strain: Low Risk  (05/21/2022)  Physical Activity: Insufficiently Active (05/21/2022)  Social Connections: Moderately Isolated (05/21/2022)  Stress: No Stress Concern Present (05/21/2022)  Tobacco Use: Medium Risk (02/22/2023)   SDOH Interventions:     Readmission Risk Interventions    01/14/2023   11:16 AM 05/04/2022    3:37 PM  Readmission Risk Prevention Plan  Transportation Screening Complete Complete  PCP or Specialist Appt within 5-7 Days Complete Complete  Home Care Screening Complete Complete  Medication Review (RN CM) Complete Complete

## 2023-02-23 NOTE — Progress Notes (Signed)
Fluid bolus started per new order

## 2023-02-23 NOTE — Progress Notes (Signed)
   02/23/23 0251  BiPAP/CPAP/SIPAP  BiPAP/CPAP/SIPAP Pt Type Adult  Reason BIPAP/CPAP not in use Non-compliant

## 2023-02-23 NOTE — Progress Notes (Signed)
Initial Nutrition Assessment  DOCUMENTATION CODES:   Not applicable  INTERVENTION:   - Ensure Enlive po BID, each supplement provides 350 kcal and 20 grams of protein  - Renal MVI daily given CKD stage IIIa  NUTRITION DIAGNOSIS:   Increased nutrient needs related to chronic illness as evidenced by estimated needs.  GOAL:   Patient will meet greater than or equal to 90% of their needs  MONITOR:   PO intake, Supplement acceptance, Labs, Weight trends, Diet advancement  REASON FOR ASSESSMENT:   Consult Assessment of nutrition requirement/status  ASSESSMENT:   77 year old female who presented to the ED on 9/16 with brown vaginal discharge. PMH of COPD, asthma, tracheobronchomalacia, CHF, BOOP, CKD stage IIIa, gout, HTN, atrial fibrillation.  9/17 - diet advanced to full liquids  Pt with acute diverticulitis with colovesical fistula. Pt on IV antibiotics.  Spoke with pt at bedside. Pt on clear liquids this morning and consumed chicken broth and gelatin for breakfast. RD provided pt with a lemon ice pop at time of visit. Shortly after RD visit, noted that pt's diet was advanced to full liquids by MD.  Pt reports feeling hungry. She reports that she normally has a good appetite but that she had less of an appetite for the 1 week PTA. Pt typically only eats 2 meals daily. She lives at an [employer] and reports that the food there is not good. Breakfast usually includes sausage or bacon with an egg and English muffin. She usually skips lunch and does not eat a snack during the day. Dinner is a small portion of whatever is being served.  Pt denies any recent weight loss but attributes this to "water retention." She states that she does not know her dry weight because it fluctuates so much based on fluid. Pt reports that her weight yesterday was 189 lbs which seemed a bit high for her. Reviewed weight history in chart. Weight has fluctuated between 85-88 kg over the  last 6 months with no real trends noted. Pt reports that her clothes fit tight some days but fit loosely other days.  Pt would like to receive an oral nutrition supplement. RD to order chocolate Ensure as this is what pt prefers.  Medications reviewed and include: SSI every 4 hours, klor-con 10 mEq daily, torsemide, IV abx, IV KCl 10 mEq x 6, IV potassium phosphate 15 mmol x 1  Labs reviewed: sodium 132, chloride 97, phosphorus 1.9, hemoglobin 10.1 CBG's: 122-127 x 24 hours  NUTRITION - FOCUSED PHYSICAL EXAM:  Flowsheet Row Most Recent Value  Orbital Region No depletion  Upper Arm Region No depletion  Thoracic and Lumbar Region No depletion  Buccal Region No depletion  Temple Region No depletion  Clavicle Bone Region No depletion  Clavicle and Acromion Bone Region Mild depletion  Scapular Bone Region No depletion  Dorsal Hand No depletion  Patellar Region No depletion  Anterior Thigh Region No depletion  Posterior Calf Region No depletion  Edema (RD Assessment) Mild  Hair Reviewed  Eyes Reviewed  Mouth Reviewed  Skin Reviewed  Nails Reviewed    Diet Order:   Diet Order             Diet full liquid Room service appropriate? Yes; Fluid consistency: Thin  Diet effective now                   EDUCATION NEEDS:   Education needs have been addressed  Skin:  Skin Assessment: Reviewed RN Assessment  Last BM:  no documented BM  Height:   Ht Readings from Last 1 Encounters:  02/12/23 5' (1.524 m)    Weight:   Wt Readings from Last 1 Encounters:  02/12/23 86.9 kg    Ideal Body Weight:  45.5 kg  BMI:  There is no height or weight on file to calculate BMI.  Estimated Nutritional Needs:   Kcal:  1500-1700  Protein:  75-90 grams  Fluid:  1.5-1.7 L    Mertie Clause, MS, RD, LDN Registered Dietitian II Please see AMiON for contact information.

## 2023-02-23 NOTE — Progress Notes (Signed)
   02/23/23 1715  Vitals  Temp 98.1 F (36.7 C)  Temp Source Oral  BP (!) 95/55  MAP (mmHg) 68  BP Location Left Arm  BP Method Automatic  Patient Position (if appropriate) Sitting  Pulse Rate 72  Pulse Rate Source Dinamap  MEWS COLOR  MEWS Score Color Green  Oxygen Therapy  SpO2 100 %  O2 Device Nasal Cannula  O2 Flow Rate (L/min) 3 L/min  MEWS Score  MEWS Temp 0  MEWS Systolic 1  MEWS Pulse 0  MEWS RR 0  MEWS LOC 0  MEWS Score 1

## 2023-02-23 NOTE — Progress Notes (Signed)
PROGRESS NOTE  Kathleen [name] KGU:[id number] DOB: August 26, 1945 DOA: 02/22/2023 PCP: [name], MD   LOS: 0 days   Brief Narrative / Interim history: 77 year old female with COPD/asthma/chronic hypoxic respiratory failure on 4 to 5 L at home, history of Boop, HTN, A-fib on Xarelto, history of systolic CHF comes into the hospital with vaginal brown discharge for a week.  Imaging on admission which showed to have diverticulitis with colovaginal fistula.  She was started on Zosyn and admitted to the hospital  Subjective / 24h Interval events: Doing well this morning.  Denies any chest pain, denies any shortness of breath  Assesement and Plan: Principal Problem:   Diverticulitis Active Problems:   Chronic respiratory failure with hypoxia (HCC)   Chronic systolic heart failure (HCC)   HTN (hypertension)   Atrial fibrillation (HCC)   Sleep apnea   Type 2 diabetes mellitus with diabetic neuropathy, without long-term current use of insulin (HCC)   Stage 3a chronic kidney disease (HCC)   COPD with asthma   Colovaginal fistula   Hypokalemia  Principal problem Acute diverticulitis with colovesical fistula-she was started on Zosyn, continue.  She is on clear liquids now, will advance as tolerated.  She does have some discomfort in the left lower quadrant but no frank pain -Discussed case with general surgery, patient would need antibiotics and improvement in her infection before any surgical intervention.  They will follow her as an outpatient with a colorectal surgeon -Could potentially go home tomorrow if she is tolerating diet advancement and clinically improving  Active problems Chronic systolic CHF-most recent 2D echo done April 2024, LVEF 35-40%.  Clinically appears euvolemic, continue home medications  PAF-continue dofetilide, Xarelto.  She has a PPM  CKD 3A-at baseline  COPD, chronic hypoxic respiratory failure-appears at baseline  DM 2 -controlled, continue sliding  scale  Lab Results  Component Value Date   HGBA1C 5.5 02/22/2023   CBG (last 3)  Recent Labs    02/23/23 0015 02/23/23 0421 02/23/23 0735  GLUCAP 122* 125* 127*    Scheduled Meds:  dofetilide  250 mcg Oral BID   fluticasone furoate-vilanterol  1 puff Inhalation Daily   gabapentin  200 mg Oral QHS   guaiFENesin  600 mg Oral BID   insulin aspart  0-9 Units Subcutaneous Q4H   potassium chloride  10 mEq Oral Daily   revefenacin  175 mcg Nebulization Daily   rivaroxaban  20 mg Oral Q supper   sodium chloride flush  3 mL Intravenous Q12H   torsemide  20 mg Oral Daily   Continuous Infusions:  sodium chloride     piperacillin-tazobactam (ZOSYN)  IV 3.375 g (02/23/23 0601)   PRN Meds:.sodium chloride, acetaminophen **OR** acetaminophen, albuterol, fentaNYL (SUBLIMAZE) injection, HYDROcodone-acetaminophen, ondansetron **OR** ondansetron (ZOFRAN) IV, sodium chloride flush  Current Outpatient Medications  Medication Instructions   acetaminophen (TYLENOL) 1,300 mg, Oral, Every 8 hours PRN   azelastine (ASTELIN) 0.1 % nasal spray 2 sprays, Each Nare, 2 times daily   benzonatate (TESSALON) 200 mg, Oral, 3 times daily PRN   cyanocobalamin (VITAMIN B12) 1,000 mcg, Intramuscular, Every 30 days   dextromethorphan (DELSYM) 30 MG/5ML liquid 10 mLs, Oral, As needed   docusate sodium (COLACE) 100 mg, Oral, 2 times daily   dofetilide (TIKOSYN) 250 mcg, Oral, 2 times daily   febuxostat (ULORIC) 40 MG tablet TAKE 1 TABLET(40 MG) BY MOUTH DAILY   fluticasone furoate-vilanterol (BREO ELLIPTA) 100-25 MCG/ACT AEPB 1 puff, Inhalation, Daily   gabapentin (NEURONTIN) 200 mg,  Oral, Daily at bedtime   levalbuterol (XOPENEX) 0.63 mg, Nebulization, Every 6 hours PRN, DX J44.89 J96.21   losartan (COZAAR) 25 MG tablet TAKE 1 TABLET(25 MG) BY MOUTH AT BEDTIME   Magnesium 500 mg, Oral, Daily at bedtime   metolazone (ZAROXOLYN) 2.5 mg, Oral, As needed, MUST TAKE OF POTASSIUM WITH THIS   Nexletol 180 mg,  Oral, Daily   OXYGEN 5 L, Inhalation, Continuous, Use with resmed ventilator   potassium chloride (KLOR-CON) 10 MEQ tablet 10 mEq, Oral, Daily   potassium chloride SA (KLOR-CON M) 20 MEQ tablet 20 mEq, Oral, 2 times daily PRN   revefenacin (YUPELRI) 175 mcg, Nebulization, Daily   rivaroxaban (XARELTO) 20 mg, Oral, Daily with supper   spironolactone (ALDACTONE) 25 MG tablet TAKE 1 TABLET(25 MG) BY MOUTH EVERY MORNING   torsemide (DEMADEX) 20 mg, Oral, Daily   Vitamin D3 2,000 Units, Oral, Daily at bedtime    Diet Orders (From admission, onward)     Start     Ordered   02/22/23 1955  Diet clear liquid Room service appropriate? Yes; Fluid consistency: Thin  Diet effective now       Question Answer Comment  Room service appropriate? Yes   Fluid consistency: Thin      02/22/23 1955            DVT prophylaxis: SCDs Start: 02/22/23 1955 rivaroxaban (XARELTO) tablet 20 mg   Lab Results  Component Value Date   PLT 272 02/23/2023      Code Status: Full Code  Family Communication: no family at bedside   Status is: Observation The patient will require care spanning > 2 midnights and should be moved to inpatient because: Iv antibiotics   Level of care: Telemetry Surgical  Consultants:  none  Objective: Vitals:   02/22/23 1955 02/23/23 0000 02/23/23 0300 02/23/23 0733  BP:   (!) 101/38 (!) 105/47  Pulse:  99 70 70  Resp:    17  Temp:  98.2 F (36.8 C) 98.3 F (36.8 C) 97.9 F (36.6 C)  TempSrc:  Oral Oral Oral  SpO2: 97% 100%  100%    Intake/Output Summary (Last 24 hours) at 02/23/2023 1017 Last data filed at 02/23/2023 0900 Gross per 24 hour  Intake 973.32 ml  Output 500 ml  Net 473.32 ml   Wt Readings from Last 3 Encounters:  02/12/23 86.9 kg  02/04/23 88 kg  01/27/23 87.1 kg    Examination:  Constitutional: NAD Eyes: no scleral icterus ENMT: Mucous membranes are moist.  Neck: normal, supple Respiratory: clear to auscultation bilaterally, no  wheezing, no crackles. Cardiovascular: Regular rate and rhythm, no murmurs / rubs / gallops.  Abdomen: non distended, no tenderness. Bowel sounds positive.  Musculoskeletal: no clubbing / cyanosis.   Data Reviewed: I have independently reviewed following labs and imaging studies   CBC Recent Labs  Lab 02/22/23 0957 02/23/23 0304  WBC 9.6 8.5  HGB 10.9* 10.1*  HCT 33.6* 30.8*  PLT 304 272  MCV 85.9 86.5  MCH 27.9 28.4  MCHC 32.4 32.8  RDW 15.9* 15.6*  LYMPHSABS 0.7  --   MONOABS 0.6  --   EOSABS 0.1  --   BASOSABS 0.1  --     Recent Labs  Lab 02/22/23 0957 02/22/23 2013 02/23/23 0304  NA 137 136 132*  K 3.3* 3.1* 4.1  CL 96* 97* 97*  CO2 30 27 24   GLUCOSE 157* 122* 113*  BUN 21 17 15   CREATININE 0.80  0.91 0.90  CALCIUM 8.8* 8.9 8.1*  AST 8*  --  11*  ALT 6  --  10  ALKPHOS 39  --  34*  BILITOT 0.7  --  1.4*  ALBUMIN 3.8  --  2.9*  MG  --  2.3 2.0  TSH  --  2.213  --   HGBA1C  --  5.5  --     ------------------------------------------------------------------------------------------------------------------ No results for input(s): "CHOL", "HDL", "LDLCALC", "TRIG", "CHOLHDL", "LDLDIRECT" in the last 72 hours.  Lab Results  Component Value Date   HGBA1C 5.5 02/22/2023   ------------------------------------------------------------------------------------------------------------------ Recent Labs    02/22/23 2013  TSH 2.213    Cardiac Enzymes No results for input(s): "CKMB", "TROPONINI", "MYOGLOBIN" in the last 168 hours.  Invalid input(s): "CK" ------------------------------------------------------------------------------------------------------------------    Component Value Date/Time   BNP 129.5 (H) 01/13/2023 1718    CBG: Recent Labs  Lab 02/23/23 0015 02/23/23 0421 02/23/23 0735  GLUCAP 122* 125* 127*    Recent Results (from the past 240 hour(s))  Culture, blood (routine x 2)     Status: None (Preliminary result)   Collection Time:  02/22/23  2:23 PM   Specimen: BLOOD  Result Value Ref Range Status   Specimen Description   Final    BLOOD RIGHT ANTECUBITAL Performed at [facility], [address] [place], [place] [address]    Special Requests   Final    BOTTLES DRAWN AEROBIC AND ANAEROBIC Blood Culture adequate volume Performed at [facility], [address], [place], [place] [address]    Culture   Final    NO GROWTH < 24 HOURS Performed at [facility], [address], [place], [place] [address]    Report Status PENDING  Incomplete  Culture, blood (routine x 2)     Status: None (Preliminary result)   Collection Time: 02/22/23  2:23 PM   Specimen: BLOOD  Result Value Ref Range Status   Specimen Description   Final    BLOOD LEFT ANTECUBITAL Performed at [facility], [address], [place], [place] [address]    Special Requests   Final    BOTTLES DRAWN AEROBIC AND ANAEROBIC Blood Culture adequate volume Performed at [facility], [address], [place], [place] [address]    Culture   Final    NO GROWTH < 24 HOURS Performed at [facility], [address] [place], [place] [address]    Report Status PENDING  Incomplete  MRSA Next Gen by PCR, Nasal     Status: None   Collection Time: 02/22/23 11:52 PM   Specimen: Nasal Mucosa; Nasal Swab  Result Value Ref Range Status   MRSA by PCR Next Gen NOT DETECTED NOT DETECTED Final    Comment: (NOTE) The GeneXpert MRSA Assay (FDA approved for NASAL specimens only), is one component of a comprehensive MRSA colonization surveillance program. It is not intended to diagnose MRSA infection nor to guide or monitor treatment for MRSA infections. Test performance is not FDA approved in patients less than 44 years old. Performed at [facility], [address] [place], [place] [address]      Radiology Studies: DG Chest 2 View  Result Date: 02/22/2023 CLINICAL  DATA:  Cough EXAM: CHEST - 2 VIEW COMPARISON:  02/12/2023 FINDINGS: Cardiac shadow is stable. Pacing device is again noted. Mild basilar atelectasis is again seen and stable. No new focal abnormality is noted. No bony abnormality is seen. IMPRESSION: Stable bibasilar atelectasis.  Electronically Signed   By: Alcide Clever M.D.   On: 02/22/2023 21:32   CT ABDOMEN PELVIS W CONTRAST  Result Date: 02/22/2023 CLINICAL DATA:  LLQ abdominal pain notes passing stool from vagina, skinny stools, never had colonoscopy EXAM: CT ABDOMEN AND PELVIS WITH CONTRAST TECHNIQUE: Multidetector CT imaging of the abdomen and pelvis was performed using the standard protocol following bolus administration of intravenous contrast. RADIATION DOSE REDUCTION: This exam was performed according to the departmental dose-optimization program which includes automated exposure control, adjustment of the mA and/or kV according to patient size and/or use of iterative reconstruction technique. CONTRAST:  OMNIPAQUE IOHEXOL 300 MG/ML  SOLN COMPARISON:  CT scan pelvis from 10/06/2021. FINDINGS: Lower chest: There are patchy atelectatic changes in the visualized lung bases. No overt consolidation. No pleural effusion. There is a pleural-based 4.5 x 6.8 mm noncalcified nodule in the right lung lower lobe. Please see follow-up recommendations below. The heart is normal in size. No pericardial effusion. Hepatobiliary: The liver is normal in size. Non-cirrhotic configuration. No suspicious mass. Note is made of a subcapsular 9 x 13 mm cyst in the left hepatic lobe, segment 2. There are several additional scattered subcentimeter hypoattenuating foci, which are too small to adequately characterize. No intrahepatic or extrahepatic bile duct dilation. No calcified gallstones. Normal gallbladder wall thickness. No pericholecystic inflammatory changes. Pancreas: Unremarkable. No pancreatic ductal dilatation or surrounding inflammatory changes. Spleen: Within  normal limits. No focal lesion. Adrenals/Urinary Tract: Adrenal glands are unremarkable. No suspicious renal mass. There is a partially exophytic 2.6 x 2.9 cm cyst arising from the left kidney lower pole, laterally. No hydronephrosis. No renal or ureteric calculi. Unremarkable urinary bladder. Stomach/Bowel: There is an approximately 12 cm long segment of proximal sigmoid colon exhibiting moderate irregular wall thickening and mild pericolonic fat stranding on the background of several diverticula, compatible with acute diverticulitis. There is adjacent trace ascites, most likely reactive. No walled off abscess or loculated collection. No pneumoperitoneum. No disproportionate dilation of small or large bowel loops to suggest bowel obstruction. 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 The appendix was not visualized; however there is no acute inflammatory process in the right lower quadrant. Vascular/Lymphatic: Mild ascites mainly in the dependent pelvis and around the diverticulitis. No walled-off abscess. No pneumoperitoneum. No abdominal or pelvic lymphadenopathy, by size criteria. No aneurysmal dilation of the major abdominal arteries. There are mild peripheral atherosclerotic vascular calcifications of the aorta and its major branches. Reproductive: Surgically absent uterus. There is small amount of air within the vagina. Other: The visualized soft tissues and abdominal wall are unremarkable. Musculoskeletal: No suspicious osseous lesions. There are mild multilevel degenerative changes in the visualized spine. IMPRESSION: 1. Acute proximal sigmoid diverticulitis. No walled off abscess or loculated collection. No pneumoperitoneum. Follow-up colonoscopy is recommended after the acute episode subsides to exclude underlying neoplastic process. 2. Loss of  fat planes between the vaginal vault and affected proximal sigmoid colon with an apparent hyperattenuating walled tract, favored to represent colovaginal fistula. 3. There is a sub 6 mm (mean diameter), pleural-based right lung lower lobe solid pulmonary nodule. Please see follow-up recommendations below. Aortic Atherosclerosis (ICD10-I70.0). No follow-up needed if patient is low-risk (and has no known or suspected primary neoplasm). Non-contrast chest CT can be considered in 12 months if patient is high-risk. This recommendation follows the consensus statement: Guidelines for Management of  Incidental Pulmonary Nodules Detected on CT Images: From the Fleischner Society 2017; Radiology 2017; 284:228-243. Electronically Signed   By: Jules Schick M.D.   On: 02/22/2023 13:32     Pamella Pert, MD, PhD [facility]  Between 7 am - 7 pm I am available, please contact me via Amion (for emergencies) or Securechat (non urgent messages)  Between 7 pm - 7 am I am not available, please contact night coverage MD/APP via Amion

## 2023-02-24 ENCOUNTER — Encounter: Payer: Self-pay | Admitting: Licensed Clinical Social Worker

## 2023-02-24 ENCOUNTER — Other Ambulatory Visit: Payer: Self-pay | Admitting: Family Medicine

## 2023-02-24 ENCOUNTER — Other Ambulatory Visit (HOSPITAL_COMMUNITY): Payer: Self-pay

## 2023-02-24 DIAGNOSIS — K5792 Diverticulitis of intestine, part unspecified, without perforation or abscess without bleeding: Secondary | ICD-10-CM | POA: Diagnosis not present

## 2023-02-24 DIAGNOSIS — E785 Hyperlipidemia, unspecified: Secondary | ICD-10-CM

## 2023-02-24 DIAGNOSIS — N824 Other female intestinal-genital tract fistulae: Secondary | ICD-10-CM | POA: Diagnosis not present

## 2023-02-24 LAB — GLUCOSE, CAPILLARY
Glucose-Capillary: 108 mg/dL — ABNORMAL HIGH (ref 70–99)
Glucose-Capillary: 111 mg/dL — ABNORMAL HIGH (ref 70–99)
Glucose-Capillary: 114 mg/dL — ABNORMAL HIGH (ref 70–99)
Glucose-Capillary: 131 mg/dL — ABNORMAL HIGH (ref 70–99)
Glucose-Capillary: 169 mg/dL — ABNORMAL HIGH (ref 70–99)
Glucose-Capillary: 96 mg/dL (ref 70–99)

## 2023-02-24 MED ORDER — HYDROCODONE-ACETAMINOPHEN 5-325 MG PO TABS
1.0000 | ORAL_TABLET | Freq: Four times a day (QID) | ORAL | 0 refills | Status: DC | PRN
  Filled 2023-02-24: qty 15, 4d supply, fill #0

## 2023-02-24 MED ORDER — POTASSIUM CHLORIDE CRYS ER 20 MEQ PO TBCR
40.0000 meq | EXTENDED_RELEASE_TABLET | Freq: Once | ORAL | Status: AC
  Administered 2023-02-24: 40 meq via ORAL
  Filled 2023-02-24: qty 2

## 2023-02-24 MED ORDER — AMOXICILLIN-POT CLAVULANATE 875-125 MG PO TABS
1.0000 | ORAL_TABLET | Freq: Two times a day (BID) | ORAL | 0 refills | Status: DC
Start: 2023-02-24 — End: 2023-03-17
  Filled 2023-02-24: qty 20, 10d supply, fill #0
  Filled 2023-02-25: qty 28, 14d supply, fill #0

## 2023-02-24 NOTE — TOC Progression Note (Signed)
Transition of Care (TOC) - Progression Note   Patient wanting to continue PT services at [facility] when she returns . NCM called company is [employer] phone [phone] , they will need HHPT and face to face faxed to them at [phone]  Patient Details  Name: Kathleen [name] MRN: [id number] Date of Birth: 1946-02-05  Transition of Care [facility]) CM/SW Contact  Corine Solorio, Adria Devon, RN Phone Number: 02/24/2023, 3:50 PM  Clinical Narrative:       Expected Discharge Plan:  (await PT eval) Barriers to Discharge: Continued Medical Work up  Expected Discharge Plan and Services   Discharge Planning Services: CM Consult                     DME Arranged: N/A                     Social Determinants of Health (SDOH) Interventions SDOH Screenings   Food Insecurity: No Food Insecurity (01/20/2023)  Housing: Low Risk  (01/14/2023)  Transportation Needs: No Transportation Needs (01/20/2023)  Utilities: Not At Risk (01/14/2023)  Alcohol Screen: Low Risk  (05/21/2022)  Depression (PHQ2-9): Low Risk  (05/21/2022)  Financial Resource Strain: Low Risk  (05/21/2022)  Physical Activity: Insufficiently Active (05/21/2022)  Social Connections: Moderately Isolated (05/21/2022)  Stress: No Stress Concern Present (05/21/2022)  Tobacco Use: Medium Risk (02/22/2023)    Readmission Risk Interventions    01/14/2023   11:16 AM 05/04/2022    3:37 PM  Readmission Risk Prevention Plan  Transportation Screening Complete Complete  PCP or Specialist Appt within 5-7 Days Complete Complete  Home Care Screening Complete Complete  Medication Review (RN CM) Complete Complete

## 2023-02-24 NOTE — Plan of Care (Signed)
  Problem: Education: Goal: Understanding of CV disease, CV risk reduction, and recovery process will improve Outcome: Progressing Goal: Individualized Educational Video(s) Outcome: Progressing   Problem: Activity: Goal: Ability to return to baseline activity level will improve Outcome: Progressing   Problem: Cardiovascular: Goal: Ability to achieve and maintain adequate cardiovascular perfusion will improve Outcome: Progressing Goal: Vascular access site(s) Level 0-1 will be maintained Outcome: Progressing   Problem: Health Behavior/Discharge Planning: Goal: Ability to safely manage health-related needs after discharge will improve Outcome: Progressing   Problem: Education: Goal: Knowledge of cardiac device and self-care will improve Outcome: Progressing Goal: Ability to safely manage health related needs after discharge will improve Outcome: Progressing Goal: Individualized Educational Video(s) Outcome: Progressing   Problem: Cardiac: Goal: Ability to achieve and maintain adequate cardiopulmonary perfusion will improve Outcome: Progressing   Problem: Education: Goal: Knowledge of General Education information will improve Description: Including pain rating scale, medication(s)/side effects and non-pharmacologic comfort measures Outcome: Progressing   Problem: Health Behavior/Discharge Planning: Goal: Ability to manage health-related needs will improve Outcome: Progressing   Problem: Clinical Measurements: Goal: Ability to maintain clinical measurements within normal limits will improve Outcome: Progressing Goal: Will remain free from infection Outcome: Progressing Goal: Diagnostic test results will improve Outcome: Progressing Goal: Respiratory complications will improve Outcome: Progressing Goal: Cardiovascular complication will be avoided Outcome: Progressing   Problem: Activity: Goal: Risk for activity intolerance will decrease Outcome: Progressing    Problem: Nutrition: Goal: Adequate nutrition will be maintained Outcome: Progressing   Problem: Coping: Goal: Level of anxiety will decrease Outcome: Progressing   Problem: Elimination: Goal: Will not experience complications related to bowel motility Outcome: Progressing Goal: Will not experience complications related to urinary retention Outcome: Progressing   Problem: Pain Managment: Goal: General experience of comfort will improve Outcome: Progressing   Problem: Safety: Goal: Ability to remain free from injury will improve Outcome: Progressing   Problem: Skin Integrity: Goal: Risk for impaired skin integrity will decrease Outcome: Progressing   Problem: Education: Goal: Ability to describe self-care measures that may prevent or decrease complications (Diabetes Survival Skills Education) will improve Outcome: Progressing Goal: Individualized Educational Video(s) Outcome: Progressing   Problem: Coping: Goal: Ability to adjust to condition or change in health will improve Outcome: Progressing   Problem: Fluid Volume: Goal: Ability to maintain a balanced intake and output will improve Outcome: Progressing   Problem: Health Behavior/Discharge Planning: Goal: Ability to identify and utilize available resources and services will improve Outcome: Progressing Goal: Ability to manage health-related needs will improve Outcome: Progressing   Problem: Metabolic: Goal: Ability to maintain appropriate glucose levels will improve Outcome: Progressing   Problem: Nutritional: Goal: Maintenance of adequate nutrition will improve Outcome: Progressing Goal: Progress toward achieving an optimal weight will improve Outcome: Progressing   Problem: Skin Integrity: Goal: Risk for impaired skin integrity will decrease Outcome: Progressing   Problem: Tissue Perfusion: Goal: Adequacy of tissue perfusion will improve Outcome: Progressing     Jaylia Pettus Tamera Stands, RN

## 2023-02-24 NOTE — Patient Outreach (Signed)
Care Coordination   02/24/2023 Name: Kathleen [name] MRN: [id number] DOB: 12-29-1945   Care Coordination Outreach Attempts:  An unsuccessful telephone outreach was attempted for a scheduled appointment today. Per chart review, patient is currently admitted in hopsital  Follow Up Plan:  Additional outreach attempts will be made to offer the patient care coordination information and services.   Encounter Outcome:  Patient Visit Completed   Care Coordination Interventions:  Yes, provided    Interventions Today    Flowsheet Row Most Recent Value  Chronic Disease   Chronic disease during today's visit Atrial Fibrillation (AFib), Hypertension (HTN), Diabetes, Chronic Obstructive Pulmonary Disease (COPD), Chronic Kidney Disease/End Stage Renal Disease (ESRD), Other  [Depression]  General Interventions   General Interventions Discussed/Reviewed Communication with  Doctor Visits Discussed/Reviewed --  [Per chart review, pt is inpatient at this time]  Communication with --  [LCSW collaborated with care guide, requesting r/s post hospitalization]       Jenel Lucks, MSW, LCSW [facility] Care Management [place]  [facility] [email or web address] Phone [phone] 2:20 PM

## 2023-02-24 NOTE — Progress Notes (Signed)
   02/24/23 2106  BiPAP/CPAP/SIPAP  Reason BIPAP/CPAP not in use Non-compliant  BiPAP/CPAP /SiPAP Vitals  Resp 16  MEWS Score/Color  MEWS Score 0  MEWS Score Color Kathleen [name]

## 2023-02-24 NOTE — Progress Notes (Signed)
Progress Note   Patient: Kathleen [name] JXB:[id number] DOB: January 27, 1946 DOA: 02/22/2023     1 DOS: the patient was seen and examined on 02/24/2023   Brief hospital course: 77 year old female with COPD/asthma/chronic hypoxic respiratory failure on 4 to 5 L at home, history of Boop, HTN, A-fib on Xarelto, history of systolic CHF comes into the hospital with vaginal brown discharge for a week. Imaging on admission which showed to have diverticulitis with colovaginal fistula. She was started on Zosyn and admitted to the hospital   Assessment and Plan: Principal problem Acute diverticulitis with colovesical fistula -she was started on Zosyn, clinically improved -Initially on a liquids, now successfully advanced to soft diet today -Discussed case with General Surgery. Recs to keep currently scheduled appointment in October. Will need colonoscopy within 4-6 weeks. Per pt's son, pt has not undergone recent colonoscopy   Active problems Chronic systolic CHF-most recent 2D echo done April 2024, LVEF 35-40%.  Clinically appears euvolemic, continue home medications Follow daily wts and I/o's   PAF-continue dofetilide, Xarelto.  She has a PPM   CKD 3A-at baseline   COPD, chronic hypoxic respiratory failure-appears at baseline   DM 2 -controlled, continue sliding scale      Subjective: Feeling better today, eager to advance diet  Physical Exam: Vitals:   02/24/23 0442 02/24/23 0729 02/24/23 1540 02/24/23 1547  BP: (!) 128/57 (!) 107/55 117/69 (!) 139/56  Pulse: 72 69 71 78  Resp:  16    Temp: 98 F (36.7 C) 98.3 F (36.8 C)    TempSrc: Oral Oral    SpO2: 100% 98% 100% 99%   General exam: Awake, laying in bed, in nad Respiratory system: Normal respiratory effort, no wheezing Cardiovascular system: regular rate, s1, s2 Gastrointestinal system: Soft, nondistended, positive BS Central nervous system: CN2-12 grossly intact, strength intact Extremities: Perfused, no clubbing Skin: Normal  skin turgor, no notable skin lesions seen Psychiatry: Mood normal // no visual hallucinations   Data Reviewed:  Labs reviewed: Na 135, K 3.7, Cr 0.83, WBC 7.8, Hgb 9.5, Plts 261  Family Communication: Pt in room, pt's son over phone  Disposition: Status is: Inpatient Remains inpatient appropriate because: severity of illness  Planned Discharge Destination: Home    Author: Rickey Barbara, MD 02/24/2023 5:43 PM  For on call review [email or web address].

## 2023-02-24 NOTE — Hospital Course (Signed)
77 year old female with COPD/asthma/chronic hypoxic respiratory failure on 4 to 5 L at home, history of Boop, HTN, A-fib on Xarelto, history of systolic CHF comes into the hospital with vaginal brown discharge for a week. Imaging on admission which showed to have diverticulitis with colovaginal fistula. She was started on Zosyn and admitted to the hospital

## 2023-02-25 ENCOUNTER — Other Ambulatory Visit (HOSPITAL_COMMUNITY): Payer: Self-pay

## 2023-02-25 DIAGNOSIS — K5792 Diverticulitis of intestine, part unspecified, without perforation or abscess without bleeding: Secondary | ICD-10-CM | POA: Diagnosis not present

## 2023-02-25 DIAGNOSIS — N824 Other female intestinal-genital tract fistulae: Secondary | ICD-10-CM | POA: Diagnosis not present

## 2023-02-25 LAB — GLUCOSE, CAPILLARY
Glucose-Capillary: 111 mg/dL — ABNORMAL HIGH (ref 70–99)
Glucose-Capillary: 122 mg/dL — ABNORMAL HIGH (ref 70–99)
Glucose-Capillary: 122 mg/dL — ABNORMAL HIGH (ref 70–99)
Glucose-Capillary: 79 mg/dL (ref 70–99)

## 2023-02-25 LAB — CBC
HCT: 29.4 % — ABNORMAL LOW (ref 36.0–46.0)
Hemoglobin: 9.4 g/dL — ABNORMAL LOW (ref 12.0–15.0)
MCH: 27.3 pg (ref 26.0–34.0)
MCHC: 32 g/dL (ref 30.0–36.0)
MCV: 85.5 fL (ref 80.0–100.0)
Platelets: 252 10*3/uL (ref 150–400)
RBC: 3.44 MIL/uL — ABNORMAL LOW (ref 3.87–5.11)
RDW: 15.4 % (ref 11.5–15.5)
WBC: 8.9 10*3/uL (ref 4.0–10.5)
nRBC: 0 % (ref 0.0–0.2)

## 2023-02-25 LAB — COMPREHENSIVE METABOLIC PANEL
ALT: 10 U/L (ref 0–44)
AST: 10 U/L — ABNORMAL LOW (ref 15–41)
Albumin: 2.8 g/dL — ABNORMAL LOW (ref 3.5–5.0)
Alkaline Phosphatase: 29 U/L — ABNORMAL LOW (ref 38–126)
Anion gap: 8 (ref 5–15)
BUN: 11 mg/dL (ref 8–23)
CO2: 24 mmol/L (ref 22–32)
Calcium: 9.1 mg/dL (ref 8.9–10.3)
Chloride: 103 mmol/L (ref 98–111)
Creatinine, Ser: 0.91 mg/dL (ref 0.44–1.00)
GFR, Estimated: >60 mL/min (ref >60)
Glucose, Bld: 110 mg/dL — ABNORMAL HIGH (ref 70–99)
Potassium: 3.7 mmol/L (ref 3.5–5.1)
Sodium: 135 mmol/L (ref 135–145)
Total Bilirubin: 0.9 mg/dL (ref 0.3–1.2)
Total Protein: 5.6 g/dL — ABNORMAL LOW (ref 6.5–8.1)

## 2023-02-25 MED ORDER — POTASSIUM CHLORIDE CRYS ER 20 MEQ PO TBCR: 20.0000 meq | EXTENDED_RELEASE_TABLET | Freq: Every day | ORAL | Status: DC

## 2023-02-25 MED ORDER — POTASSIUM CHLORIDE CRYS ER 20 MEQ PO TBCR
40.0000 meq | EXTENDED_RELEASE_TABLET | Freq: Once | ORAL | Status: AC
  Administered 2023-02-25: 40 meq via ORAL
  Filled 2023-02-25: qty 2

## 2023-02-25 NOTE — Discharge Summary (Addendum)
Physician Discharge Summary   Patient: Kathleen [name] MRN: [id number] DOB: 13-Apr-1946  Admit date:     02/22/2023  Discharge date: 02/25/23  Discharge Physician: Rickey Barbara   PCP: [name], MD   Recommendations at discharge:    Follow up with PCP in 1-2 weeks Follow up with GI as scheduled Follow up with General Surgery as scheduled  Discharge Diagnoses: Principal Problem:   Diverticulitis Active Problems:   Chronic respiratory failure with hypoxia (HCC)   Chronic systolic heart failure (HCC)   HTN (hypertension)   Atrial fibrillation (HCC)   Sleep apnea   Type 2 diabetes mellitus with diabetic neuropathy, without long-term current use of insulin (HCC)   Stage 3a chronic kidney disease (HCC)   COPD with asthma   Colovaginal fistula   Hypokalemia  Resolved Problems:   Right shoulder pain  Hospital Course: 77 year old female with COPD/asthma/chronic hypoxic respiratory failure on 4 to 5 L at home, history of Boop, HTN, A-fib on Xarelto, history of systolic CHF comes into the hospital with vaginal brown discharge for a week. Imaging on admission which showed to have diverticulitis with colovaginal fistula. She was started on Zosyn and admitted to the hospital   Assessment and Plan: Principal problem Acute diverticulitis with colovesical fistula -she was started on Zosyn, clinically improved -Initially on a liquids, now successfully advanced to soft diet today -Discussed case with General Surgery. Recs to keep currently scheduled appointment in October. Will need colonoscopy after several weeks out. Per pt's son, pt has not undergone recent colonoscopy -Discussed with pt's primary GI team. Pt is considered high risk for colonoscopy. Appointment will be arranged with GI with general surgery to follow up afterwards.   Active problems Chronic systolic CHF-most recent 2D echo done April 2024, LVEF 35-40%.  Clinically appears euvolemic, continue home medications    PAF-continue dofetilide, Xarelto.  She has a PPM   CKD 3A-at baseline   COPD, chronic hypoxic respiratory failure-appears at baseline   DM 2 -controlled, continued sliding scale while in hospital       Consultants: General Surgery Procedures performed:   Disposition: Home Diet recommendation:  Carb modified diet DISCHARGE MEDICATION: Allergies as of 02/25/2023       Reactions   Allopurinol Other (See Comments)   Reaction:  Dizziness    Clindamycin Anaphylaxis, Hives   Flublok [influenza Vaccine Recombinant] Other (See Comments)   Fever 103 with no alternative explanation day after vaccine.  Clydie Braun Highfill FNP-C   Pneumococcal 13-val Conj Vacc Itching, Swelling, Rash   Dronedarone Rash   Montelukast Other (See Comments)   Makes her loopy.   Brovana [arformoterol]    Caused muscle pain   Budesonide    Caused extreme joint pain   Entresto [sacubitril-valsartan] Other (See Comments)   hypotension   Fosamax [alendronate Sodium] Nausea Only   Jardiance [empagliflozin]    Caused a vaginal infection   Meperidine Nausea And Vomiting   Microplegia Msa-msg [plegisol]    Loopy,diarrhea   Rosuvastatin Other (See Comments)   Reaction:  Muscle spasms    Tetracycline Hives   Adhesive [tape] Rash   Lovastatin Rash, Other (See Comments)   Muscle Pain        Medication List     STOP taking these medications    febuxostat 40 MG tablet Commonly known as: ULORIC   losartan 25 MG tablet Commonly known as: COZAAR   Nexletol 180 MG Tabs Generic drug: Bempedoic Acid   spironolactone 25 MG tablet Commonly  known as: ALDACTONE       TAKE these medications    acetaminophen 650 MG CR tablet Commonly known as: TYLENOL Take 1,300 mg by mouth every 8 (eight) hours as needed for pain.   amoxicillin-clavulanate 875-125 MG tablet Commonly known as: AUGMENTIN Take 1 tablet by mouth 2 (two) times daily for 14 days.   azelastine 0.1 % nasal spray Commonly known as:  ASTELIN Place 2 sprays into both nostrils 2 (two) times daily.   benzonatate 200 MG capsule Commonly known as: TESSALON Take 1 capsule (200 mg total) by mouth 3 (three) times daily as needed.   cyanocobalamin 1000 MCG/ML injection Commonly known as: VITAMIN B12 Inject 1 mL (1,000 mcg total) into the muscle every 30 (thirty) days.   dextromethorphan 30 MG/5ML liquid Commonly known as: DELSYM Take 10 mLs by mouth as needed for cough.   docusate sodium 100 MG capsule Commonly known as: COLACE Take 100 mg by mouth 2 (two) times daily.   dofetilide 250 MCG capsule Commonly known as: TIKOSYN Take 1 capsule (250 mcg total) by mouth in the morning and at bedtime.   fluticasone furoate-vilanterol 100-25 MCG/ACT Aepb Commonly known as: Breo Ellipta Inhale 1 puff into the lungs daily.   gabapentin 100 MG capsule Commonly known as: Neurontin Take 2 capsules (200 mg total) by mouth at bedtime.   HYDROcodone-acetaminophen 5-325 MG tablet Commonly known as: NORCO/VICODIN Take 1 tablet by mouth every 6 (six) hours as needed for severe pain.   levalbuterol 0.63 MG/3ML nebulizer solution Commonly known as: XOPENEX Take 3 mLs (0.63 mg total) by nebulization every 6 (six) hours as needed for wheezing or shortness of breath. DX J44.89 J96.21   Magnesium 500 MG Tabs Take 500 mg by mouth at bedtime.   metolazone 2.5 MG tablet Commonly known as: ZAROXOLYN Take 1 tablet (2.5 mg total) by mouth as needed (for swelling or SOB). MUST TAKE OF POTASSIUM WITH THIS   OXYGEN Inhale 5 L into the lungs continuous. Use with resmed ventilator   potassium chloride 10 MEQ tablet Commonly known as: KLOR-CON Take 1 tablet (10 mEq total) by mouth daily.   potassium chloride SA 20 MEQ tablet Commonly known as: KLOR-CON M Take 20 mEq by mouth 2 (two) times daily as needed (when she takes super water pill).   revefenacin 175 MCG/3ML nebulizer solution Commonly known as: YUPELRI Take 175 mcg by  nebulization daily.   rivaroxaban 20 MG Tabs tablet Commonly known as: Xarelto Take 1 tablet (20 mg total) by mouth daily with supper.   torsemide 20 MG tablet Commonly known as: DEMADEX Take 1 tablet (20 mg total) by mouth daily.   Vitamin D3 50 MCG (2000 UT) Tabs Take 2,000 Units by mouth at bedtime.        Follow-up Information     Karie Soda, MD Follow up on 05/24/2023.   Specialties: General Surgery, Colon and Rectal Surgery Why: 8:30am, Arrive 30 minutes prior to your appointment time, Please bring your insurance card and photo ID Contact information: [address] [place] [address] [phone]         Luciano Cutter, MD Follow up in 1 month(s).   Specialty: Pulmonary Disease Why: You will need pulmonary clearance prior to seeing surgical team for evaluation of possible surgery Contact information: [address] [place] [address] [phone]         Dolores Patty, MD Follow up in 1 month(s).  Specialty: Cardiology Why: You will need cardiac clearance prior to seeing surgical team for evaluation of possible surgery Contact information: [address] [place] [address] [phone]         Gastroenterology Follow up in 1 month(s).   Why: You will need to see GI for a colonoscopy prior to your appointment with general surgery               Discharge Exam: There were no vitals filed for this visit. General exam: Awake, laying in bed, in nad Respiratory system: Normal respiratory effort, no wheezing Cardiovascular system: regular rate, s1, s2 Gastrointestinal system: Soft, nondistended, positive BS Central nervous system: CN2-12 grossly intact, strength intact Extremities: Perfused, no clubbing Skin: Normal skin turgor, no notable skin lesions seen Psychiatry: Mood normal // no visual hallucinations   Condition at discharge: fair  The results of significant diagnostics from  this hospitalization (including imaging, microbiology, ancillary and laboratory) are listed below for reference.   Imaging Studies: DG Chest 2 View  Result Date: 02/22/2023 CLINICAL DATA:  Cough EXAM: CHEST - 2 VIEW COMPARISON:  02/12/2023 FINDINGS: Cardiac shadow is stable. Pacing device is again noted. Mild basilar atelectasis is again seen and stable. No new focal abnormality is noted. No bony abnormality is seen. IMPRESSION: Stable bibasilar atelectasis. Electronically Signed   By: Alcide Clever M.D.   On: 02/22/2023 21:32   CT ABDOMEN PELVIS W CONTRAST  Result Date: 02/22/2023 CLINICAL DATA:  LLQ abdominal pain notes passing stool from vagina, skinny stools, never had colonoscopy EXAM: CT ABDOMEN AND PELVIS WITH CONTRAST TECHNIQUE: Multidetector CT imaging of the abdomen and pelvis was performed using the standard protocol following bolus administration of intravenous contrast. RADIATION DOSE REDUCTION: This exam was performed according to the departmental dose-optimization program which includes automated exposure control, adjustment of the mA and/or kV according to patient size and/or use of iterative reconstruction technique. CONTRAST:  OMNIPAQUE IOHEXOL 300 MG/ML  SOLN COMPARISON:  CT scan pelvis from 10/06/2021. FINDINGS: Lower chest: There are patchy atelectatic changes in the visualized lung bases. No overt consolidation. No pleural effusion. There is a pleural-based 4.5 x 6.8 mm noncalcified nodule in the right lung lower lobe. Please see follow-up recommendations below. The heart is normal in size. No pericardial effusion. Hepatobiliary: The liver is normal in size. Non-cirrhotic configuration. No suspicious mass. Note is made of a subcapsular 9 x 13 mm cyst in the left hepatic lobe, segment 2. There are several additional scattered subcentimeter hypoattenuating foci, which are too small to adequately characterize. No intrahepatic or extrahepatic bile duct dilation. No calcified  gallstones. Normal gallbladder wall thickness. No pericholecystic inflammatory changes. Pancreas: Unremarkable. No pancreatic ductal dilatation or surrounding inflammatory changes. Spleen: Within normal limits. No focal lesion. Adrenals/Urinary Tract: Adrenal glands are unremarkable. No suspicious renal mass. There is a partially exophytic 2.6 x 2.9 cm cyst arising from the left kidney lower pole, laterally. No hydronephrosis. No renal or ureteric calculi. Unremarkable urinary bladder. Stomach/Bowel: There is an approximately 12 cm long segment of proximal sigmoid colon exhibiting moderate irregular wall thickening and mild pericolonic fat stranding on the background of several diverticula, compatible with acute diverticulitis. There is adjacent trace ascites, most likely reactive. No walled off abscess or loculated collection. No pneumoperitoneum. No disproportionate dilation of small or large bowel loops to suggest bowel obstruction. There is loss of fat planes between the vaginal vault and affected proximal sigmoid colon with an apparent hyperattenuating walled tract (series 6, images  48-50), favored to represent colovaginal fistula. The appendix was not visualized; however there is no acute inflammatory process in the right lower quadrant. Vascular/Lymphatic: Mild ascites mainly in the dependent pelvis and around the diverticulitis. No walled-off abscess. No pneumoperitoneum. No abdominal or pelvic lymphadenopathy, by size criteria. No aneurysmal dilation of the major abdominal arteries. There are mild peripheral atherosclerotic vascular calcifications of the aorta and its major branches. Reproductive: Surgically absent uterus. There is small amount of air within the vagina. Other: The visualized soft tissues and abdominal wall are unremarkable. Musculoskeletal: No suspicious osseous lesions. There are mild multilevel degenerative changes in the visualized spine. IMPRESSION: 1. Acute proximal sigmoid  diverticulitis. No walled off abscess or loculated collection. No pneumoperitoneum. Follow-up colonoscopy is recommended after the acute episode subsides to exclude underlying neoplastic process. 2. 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 3. There is a sub 6 mm (mean diameter), pleural-based right lung lower lobe solid pulmonary nodule. Please see follow-up recommendations below. Aortic Atherosclerosis (ICD10-I70.0). No follow-up needed if patient is low-risk (and has no known or suspected primary neoplasm). Non-contrast chest CT can be considered in 12 months if patient is high-risk. This recommendation follows the consensus statement: Guidelines for Management of Incidental Pulmonary Nodules Detected on CT Images: From the Fleischner Society 2017; Radiology 2017; 284:228-243. Electronically Signed   By: Jules Schick M.D.   On: 02/22/2023 13:32   DG Chest 2 View  Result Date: 02/12/2023 CLINICAL DATA:  Fever.  COPD. EXAM: CHEST - 2 VIEW COMPARISON:  January 16, 2023. FINDINGS: Stable cardiomediastinal silhouette. Left-sided pacemaker is unchanged in position. Minimal bibasilar subsegmental atelectasis is noted. Degenerative changes are seen involving both shoulder joints. IMPRESSION: Minimal bibasilar subsegmental atelectasis. Electronically Signed   By: Lupita Raider M.D.   On: 02/12/2023 18:19   NCV with EMG (electromyography)  Result Date: 02/09/2023 Tyrell Antonio, MD     02/16/2023  5:26 AM EMG & NCV Findings: Evaluation of the left median motor nerve showed decreased conduction velocity (Elbow-Wrist, 44 m/s).  The right median motor nerve showed prolonged distal onset latency (4.3 ms) and decreased conduction velocity (Elbow-Wrist, 41 m/s).  The left median (across palm) sensory nerve showed prolonged distal peak latency (Wrist, 4.8 ms) and prolonged distal peak latency  (Palm, 2.5 ms).  The right median (across palm) sensory nerve showed no response (Palm) and prolonged distal peak latency (4.8 ms).  The left ulnar sensory nerve showed prolonged distal peak latency (4.5 ms), reduced amplitude (2.7 V), and decreased conduction velocity (Wrist-5th Digit, 31 m/s).  The right ulnar sensory nerve showed prolonged distal peak latency (3.9 ms) and decreased conduction velocity (Wrist-5th Digit, 36 m/s).  All remaining nerves (as indicated in the following tables) were within normal limits.  Left vs. Right side comparison data for the ulnar motor nerve indicates abnormal L-R velocity difference (A Elbow-B Elbow, 24 m/s).  The ulnar sensory nerve indicates abnormal L-R latency difference (0.6 ms) and abnormal L-R amplitude difference (84.8 %).  All remaining left vs. right side differences were within normal limits.  All examined muscles (as indicated in the following table) showed no evidence of electrical instability.  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  Clinical correlation with her symptoms is paramount as she likely has some osteoarthritic  pain as well. There is no significant electrodiagnostic evidence of any other focal nerve entrapment, brachial plexopathy or cervical radiculopathy. Recommendations: 1.  Follow-up with referring physician. 2.  Continue current management of symptoms. 3.  Continue use of resting splint at night-time and as needed during the day. 4.  Suggest diagnostic and therapeutic carpal tunnel injection or even hydrodissection which Dr. Shon Baton in our office can do.  Consider surgical evaluation. ___________________________ Naaman Plummer FAAPMR Board Certified, American Board of Physical Medicine and Rehabilitation Nerve Conduction Studies Anti Sensory Summary Table  Stim Site NR Peak (ms) Norm Peak  (ms) P-T Amp (V) Norm P-T Amp Site1 Site2 Delta-P (ms) Dist (cm) Vel (m/s) Norm Vel (m/s) Left Median Acr Palm Anti Sensory (2nd Digit)  30.6C Wrist    *4.8 <3.6 23.3 >10 Wrist Palm 2.3 0.0   Palm    *2.5 <2.0 7.4        Right Median Acr Palm Anti Sensory (2nd Digit)  31.7C Wrist    *4.8 <3.6 16.3 >10 Wrist Palm  0.0   Palm *NR  <2.0         Left Radial Anti Sensory (Base 1st Digit)  31.3C Wrist    2.6 <3.1 17.4  Wrist Base 1st Digit 2.6 0.0   Right Radial Anti Sensory (Base 1st Digit)  31.8C Wrist    2.9 <3.1 8.1  Wrist Base 1st Digit 2.9 0.0   Left Ulnar Anti Sensory (5th Digit)  31.5C Wrist    *4.5 <3.7 *2.7 >15.0 Wrist 5th Digit 4.5 14.0 *31 >38 Right Ulnar Anti Sensory (5th Digit)  32C Wrist    *3.9 <3.7 17.8 >15.0 Wrist 5th Digit 3.9 14.0 *36 >38 Motor Summary Table  Stim Site NR Onset (ms) Norm Onset (ms) O-P Amp (mV) Norm O-P Amp Site1 Site2 Delta-0 (ms) Dist (cm) Vel (m/s) Norm Vel (m/s) Left Median Motor (Abd Poll Brev)  31.4C Wrist    4.2 <4.2 6.6 >5 Elbow Wrist 4.3 19.0 *44 >50 Elbow    8.5  5.7        Right Median Motor (Abd Poll Brev)  31.9C Wrist    *4.3 <4.2 6.8 >5 Elbow Wrist 4.8 19.5 *41 >50 Elbow    9.1  4.1        Left Ulnar Motor (Abd Dig Min)  31.7C Wrist    2.9 <4.2 6.5 >3 B Elbow Wrist 3.1 17.0 55 >53 B Elbow    6.0  9.9  A Elbow B Elbow 1.7 10.0 59 >53 A Elbow    7.7  3.4        Right Ulnar Motor (Abd Dig Min)  32.3C Wrist    2.9 <4.2 6.7 >3 B Elbow Wrist 3.4 19.0 56 >53 B Elbow    6.3  5.2  A Elbow B Elbow 1.2 10.0 83 >53 A Elbow    7.5  1.8        EMG  Side Muscle Nerve Root Ins Act Fibs Psw Amp Dur Poly Recrt Int Dennie Bible Comment Right Abd Poll Brev Median C8-T1 Nml Nml Nml Nml Nml 0 Nml Nml  Right 1stDorInt Ulnar C8-T1 Nml Nml Nml Nml Nml 0 Nml Nml  Right PronatorTeres Median C6-7 Nml Nml Nml Nml Nml 0 Nml Nml  Right Biceps Musculocut C5-6 Nml Nml Nml Nml Nml 0 Nml Nml  Right Deltoid Axillary C5-6 Nml Nml Nml Nml Nml 0 Nml Nml  Nerve Conduction Studies Anti Sensory Left/Right  Comparison  Stim Site L Lat (ms) R  Lat (ms) L-R Lat (ms) L Amp (V) R Amp (V) L-R Amp (%) Site1 Site2 L Vel (m/s) R Vel (m/s) L-R Vel (m/s) Median Acr Palm Anti Sensory (2nd Digit)  30.6C Wrist *4.8 *4.8 0.0 23.3 16.3 30.0 Wrist Palm    Palm *2.5   7.4        Radial Anti Sensory (Base 1st Digit)  31.3C Wrist 2.6 2.9 0.3 17.4 8.1 53.4 Wrist Base 1st Digit    Ulnar Anti Sensory (5th Digit)  31.5C Wrist *4.5 *3.9 *0.6 *2.7 17.8 *84.8 Wrist 5th Digit *31 *36 5 Motor Left/Right Comparison  Stim Site L Lat (ms) R Lat (ms) L-R Lat (ms) L Amp (mV) R Amp (mV) L-R Amp (%) Site1 Site2 L Vel (m/s) R Vel (m/s) L-R Vel (m/s) Median Motor (Abd Poll Brev)  31.4C Wrist 4.2 *4.3 0.1 6.6 6.8 2.9 Elbow Wrist *44 *41 3 Elbow 8.5 9.1 0.6 5.7 4.1 28.1      Ulnar Motor (Abd Dig Min)  31.7C Wrist 2.9 2.9 0.0 6.5 6.7 3.0 B Elbow Wrist 55 56 1 B Elbow 6.0 6.3 0.3 9.9 5.2 47.5 A Elbow B Elbow 59 83 *24 A Elbow 7.7 7.5 0.2 3.4 1.8 47.1      Waveforms:              Microbiology: Results for orders placed or performed during the hospital encounter of 02/22/23  Culture, blood (routine x 2)     Status: None (Preliminary result)   Collection Time: 02/22/23  2:23 PM   Specimen: BLOOD  Result Value Ref Range Status   Specimen Description   Final    BLOOD RIGHT ANTECUBITAL Performed at [facility], [address], [place], [place] [address]    Special Requests   Final    BOTTLES DRAWN AEROBIC AND ANAEROBIC Blood Culture adequate volume Performed at [facility], [address], [place], [place] [address]    Culture   Final    NO GROWTH 3 DAYS Performed at [facility], [address], [place], [place] [address]    Report Status PENDING  Incomplete  Culture, blood (routine x 2)     Status: None (Preliminary result)   Collection Time: 02/22/23  2:23 PM   Specimen: BLOOD  Result Value Ref Range Status   Specimen Description   Final    BLOOD LEFT ANTECUBITAL Performed at [facility], [address] [place], [place] [address]    Special Requests   Final    BOTTLES DRAWN AEROBIC AND ANAEROBIC Blood Culture adequate volume Performed at [facility], [address], [place], [place] [address]    Culture   Final    NO GROWTH 3 DAYS Performed at [facility], [address], [place], [place] [address]    Report Status PENDING  Incomplete  MRSA Next Gen by PCR, Nasal     Status: None   Collection Time: 02/22/23 11:52 PM   Specimen: Nasal Mucosa; Nasal Swab  Result Value Ref Range Status   MRSA by PCR Next Gen NOT DETECTED NOT DETECTED Final    Comment: (NOTE) The GeneXpert MRSA Assay (FDA approved for NASAL specimens only), is one component of a comprehensive MRSA colonization surveillance program. It is not intended to diagnose MRSA infection nor to guide or monitor treatment for MRSA infections. Test performance is not FDA approved in patients less than 13 years old. Performed at [facility], [address]  [place], [place] [address]     Labs: CBC: Recent Labs  Lab 02/22/23 0957 02/23/23 0304 02/24/23 4401 02/25/23 0235  WBC 9.6 8.5 7.8 8.9  NEUTROABS 8.0*  --   --   --   HGB 10.9* 10.1* 9.5* 9.4*  HCT 33.6* 30.8* 29.3* 29.4*  MCV 85.9 86.5 86.9 85.5  PLT 304 272 261 252   Basic Metabolic Panel: Recent Labs  Lab 02/22/23 0957 02/22/23 2013 02/23/23 0304 02/24/23 0638 02/25/23 0235  NA 137 136 132* 135 135  K 3.3* 3.1* 4.1 3.7 3.7  CL 96* 97* 97* 105 103  CO2 30 27 24 23 24   GLUCOSE 157* 122* 113* 121* 110*  BUN 21 17 15  7* 11  CREATININE 0.80 0.91 0.90 0.83 0.91  CALCIUM 8.8* 8.9 8.1* 8.2* 9.1  MG  --  2.3 2.0 2.1  --   PHOS  --  2.3* 1.9*  --   --    Liver Function Tests: Recent Labs  Lab 02/22/23 0957 02/23/23 0304 02/24/23 0638 02/25/23 0235  AST 8* 11* 10* 10*  ALT 6 10 9 10   ALKPHOS 39 34* 32* 29*  BILITOT 0.7 1.4* 0.7 0.9  PROT 6.6 5.8* 5.5* 5.6*  ALBUMIN 3.8  2.9* 2.7* 2.8*   CBG: Recent Labs  Lab 02/24/23 2057 02/25/23 0028 02/25/23 0343 02/25/23 0828 02/25/23 1231  GLUCAP 169* 122* 122* 111* 79    Discharge time spent: less than 30 minutes.  Signed: Rickey Barbara, MD [facility] 02/25/2023

## 2023-02-25 NOTE — Evaluation (Signed)
Physical Therapy Brief Evaluation and Discharge Note Patient Details Name: Kathleen [name] MRN: [id number] DOB: 04-27-1946 Today's Date: 02/25/2023   History of Present Illness  The pt is a 77 yo female presenting 9/16 with brown stool/discharge from vagina x1 week. 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  PMH includes: COPD on 4-5L O2, asthma, OSA on bipap, afib, CHF.   Clinical Impression  Pt agreeable to session, able to demo good independence with sit-stand transfers in room with or without DME, and 252ft hallway ambulation with supervision and use of RW. The pt was educated on use of rollator functions and standing rest breaks while she recovers endurance, SpO2 stable on 4-6L O2 (our tanks do not have option for 5L). No further acute PT needs, pt can resume PT at [facility] to progress endurance and strength deficits.        PT Assessment All further PT needs can be met in the next venue of care  Assistance Needed at Discharge  PRN    Equipment Recommendations None recommended by PT  Recommendations for Other Services       Precautions/Restrictions Precautions Precautions: Fall Precaution Comments: on 4-5L O2 at baseline Restrictions Weight Bearing Restrictions: No        Mobility  Bed Mobility Rolling: Modified independent (Device/Increase time)        Transfers Overall transfer level: Needs assistance Equipment used: Rolling walker (2 wheels), None Transfers: Sit to/from Stand Sit to Stand: Supervision           General transfer comment: supervision for safety, no assistance given. cues for hand placment with RW    Ambulation/Gait Ambulation/Gait assistance: Supervision Gait Distance (Feet): 250 Feet Assistive device: Rolling walker (2 wheels) Gait Pattern/deviations: Step-through pattern, Decreased stride length   General Gait Details: slow but steady, SpO2 stable on 6L (our tank does not go to 5L)      Balance Overall balance  assessment: Mild deficits observed, not formally tested                        Pertinent Vitals/Pain PT - Brief Vital Signs All Vital Signs Stable: Yes Pain Assessment Pain Assessment: No/denies pain     Home Living Family/patient expects to be discharged to:: Other (Comment)             Additional Comments: [facility]. has shower chair, hand held shower head; walk in shower. elevators within the home    Prior Function Level of Independence: Independent with assistive device(s) Comments: uses rollator in community and no DME in apt. on 4-5 LO2 at baseline    UE/LE Assessment   UE ROM/Strength/Tone/Coordination: [facility]    LE ROM/Strength/Tone/Coordination: Generalized weakness      Communication   Communication Communication: No apparent difficulties Cueing Techniques: Verbal cues     Cognition Overall Cognitive Status: Appears within functional limits for tasks assessed/performed       General Comments General comments (skin integrity, edema, etc.): VSS on 4L at rest and 6L with walking (our tank does not go to 5L)        Assessment/Plan    PT Problem List Decreased strength;Decreased activity tolerance;Decreased balance;Decreased mobility;Cardiopulmonary status limiting activity       PT Visit Diagnosis Other abnormalities of gait and mobility (R26.89)      AMPAC 6 Clicks Help needed turning from your back to your side while in a flat bed without using bedrails?: None Help needed moving from lying on your  back to sitting on the side of a flat bed without using bedrails?: None Help needed moving to and from a bed to a chair (including a wheelchair)?: None Help needed standing up from a chair using your arms (e.g., wheelchair or bedside chair)?: None Help needed to walk in hospital room?: A Little Help needed climbing 3-5 steps with a railing? : A Little 6 Click Score: 22      End of Session Equipment Utilized During Treatment: Gait  belt;Oxygen Activity Tolerance: Patient tolerated treatment well Patient left: in bed;with call bell/phone within reach (sitting EOB) Nurse Communication: Mobility status PT Visit Diagnosis: Other abnormalities of gait and mobility (R26.89)     Time: 8657-8469 PT Time Calculation (min) (ACUTE ONLY): 24 min  Charges:   PT Evaluation $PT Eval Low Complexity: 1 Low      Vickki Muff, PT, DPT   Acute Rehabilitation Department Office [phone] Secure Chat Communication Preferred  Ronnie Derby  02/25/2023, 3:07 PM

## 2023-02-25 NOTE — Progress Notes (Signed)
Patient escorted to front entrance via wheelchair to be discharged home with her son. Patient satisfied all personal belongings accounted for.

## 2023-02-25 NOTE — Plan of Care (Signed)
  Problem: Education: Goal: Knowledge of General Education information will improve Description: Including pain rating scale, medication(s)/side effects and non-pharmacologic comfort measures Outcome: Progressing   Problem: Clinical Measurements: Goal: Ability to maintain clinical measurements within normal limits will improve Outcome: Progressing Goal: Will remain free from infection Outcome: Progressing Goal: Respiratory complications will improve Outcome: Progressing Goal: Cardiovascular complication will be avoided Outcome: Progressing   Problem: Activity: Goal: Risk for activity intolerance will decrease Outcome: Progressing   Problem: Nutrition: Goal: Adequate nutrition will be maintained Outcome: Progressing   Problem: Coping: Goal: Level of anxiety will decrease Outcome: Progressing

## 2023-02-25 NOTE — TOC Progression Note (Signed)
Transition of Care (TOC) - Progression Note   Faxed HHPT orders and face to face to [facility]  Patient Details  Name: TIERANY TEPE MRN: [id number] Date of Birth: 1946/04/22  Transition of Care [facility]) CM/SW Contact  Aydin Hink, Adria Devon, RN Phone Number: 02/25/2023, 2:15 PM  Clinical Narrative:       Expected Discharge Plan:  (await PT eval) Barriers to Discharge: Continued Medical Work up  Expected Discharge Plan and Services   Discharge Planning Services: CM Consult     Expected Discharge Date: 02/25/23               DME Arranged: N/A                     Social Determinants of Health (SDOH) Interventions SDOH Screenings   Food Insecurity: No Food Insecurity (01/20/2023)  Housing: Low Risk  (01/14/2023)  Transportation Needs: No Transportation Needs (01/20/2023)  Utilities: Not At Risk (01/14/2023)  Alcohol Screen: Low Risk  (05/21/2022)  Depression (PHQ2-9): Low Risk  (05/21/2022)  Financial Resource Strain: Low Risk  (05/21/2022)  Physical Activity: Insufficiently Active (05/21/2022)  Social Connections: Moderately Isolated (05/21/2022)  Stress: No Stress Concern Present (05/21/2022)  Tobacco Use: Medium Risk (02/22/2023)    Readmission Risk Interventions    01/14/2023   11:16 AM 05/04/2022    3:37 PM  Readmission Risk Prevention Plan  Transportation Screening Complete Complete  PCP or Specialist Appt within 5-7 Days Complete Complete  Home Care Screening Complete Complete  Medication Review (RN CM) Complete Complete

## 2023-02-27 LAB — CULTURE, BLOOD (ROUTINE X 2)
Culture: NO GROWTH
Culture: NO GROWTH
Special Requests: ADEQUATE
Special Requests: ADEQUATE

## 2023-03-01 ENCOUNTER — Ambulatory Visit: Payer: Medicare Other | Attending: Cardiology

## 2023-03-01 ENCOUNTER — Encounter (HOSPITAL_BASED_OUTPATIENT_CLINIC_OR_DEPARTMENT_OTHER): Payer: Self-pay | Admitting: Pulmonary Disease

## 2023-03-01 DIAGNOSIS — I5022 Chronic systolic (congestive) heart failure: Secondary | ICD-10-CM | POA: Diagnosis not present

## 2023-03-01 DIAGNOSIS — Z950 Presence of cardiac pacemaker: Secondary | ICD-10-CM

## 2023-03-01 NOTE — Telephone Encounter (Signed)
LM for PT to make appt . See past notes. One note from [facility] indicates the following: Front staff needs permission to double book

## 2023-03-01 NOTE — Telephone Encounter (Signed)
LOV 02/12/2023 acute visit with TP- does patient need another OV?

## 2023-03-01 NOTE — Progress Notes (Unsigned)
EPIC Encounter for ICM Monitoring  Patient Name: Kathleen [name] is a 77 y.o. female Date: 03/01/2023 Primary Care Physican: [name], MD Primary Cardiologist: Bensimhon Electrophysiologist: Camnitz Bi-V Pacing: 100%          07/28/2022 Weight: 184.6 lbs 09/07/2022   Weight: 187 lbs 11/17/2022 Weight: 190-191 lbs    02/04/2023 Office: 194 lbs                                                 Spoke with patient and heart failure questions reviewed.  Transmission results reviewed.  Pt reports feet are very swollen at this time and unable to wear shoes since hospital discharge on 9/19.  9/16 hospitalization for diverticulitis.   She has not resumed Metolazone or Potassium since hospital discharge.     Diet:  She lives in an assisted living  Eats foods that are prepared at the facility she lives in which are not low salt.     Optivol thoracic impedance suggesting possible fluid accumulation starting 9/15 (correlates with 9/16 hospitalization) but trending back toward baseline.  Also suggesting possible fluid accumulation from 9/4-9/11.   Prescribed:  Torsemide 20 mg take 1 tablet(s) (20 mg total) by mouth daily.   Potassium 20 mEq take 1 tablet (20 mEq total) by mouth two times daily as needed when she takes super water pill Metolazone 2.5 mg take 1 tablet by mouth as needed (for swelling or SOB).  Must take 40 mEq of Potassium with this.    Labs: 02/25/2023 Creatinine 0.91, BUN 11, Potassium 3.7, Sodium 135, GFR >60  02/24/2023 Creatinine 0.83, BUN 7, Potassium 3.7, Sodium 135, GFR >60  02/23/2023 Creatinine 0.90, BUN 15, Potassium 4.1, Sodium 132, GFR >60  02/22/2023 Creatinine 0.91, BUN 17, Potassium 3.1, Sodium 136, GFR >60 01/27/2023 Creatinine 0.86, BUN 32, Potassium 4.2, Sodium 139 01/16/2023 Creatinine 0.91, BUN 40, Potassium 4.0, Sodium 137, GFR >60  01/14/2023 Creatinine 0.73, BUN 19, Potassium 4.0, Sodium 137, GFR >60  01/13/2023 Creatinine 0.68, BUN 22, Potassium 4.5, Sodium  139, GFR >60  09/15/2022 Creatinine 0.84, BUN 19, Potassium 4.3, Sodium 139, GFR >60 A complete set of results can be found in Results Review.   Recommendations:  Per 9/19 discharge instructions.  She was instructed to resume Metolazone/Potassium. Advised to take a dosage of Metolazone with Potassium 30 minutes prior to her Torsemide dosage tomorrow morning since it was approved to take after hospitalization.    Follow-up plan: ICM clinic phone appointment on 03/15/2023 to recheck fluid levels.  91 day device clinic remote transmission 03/31/2023.     EP/Cardiology Office Visits:    Recall 07/14/2023 with Dr Gala Romney.  Recall 05/13/2023 with Francis Dowse, PA or Otilio Saber, [place].     Copy of ICM check sent to Dr. Elberta Fortis and Dr Gala Romney as Lorain Childes and further recommendations if needed.    3 month ICM trend: 03/01/2023.    12-14 Month ICM trend:     Karie Soda, RN 03/01/2023 3:18 PM

## 2023-03-02 NOTE — Telephone Encounter (Signed)
Called PT and offered 11/24 appt but way too far out. Will wait to see if Dr. Vassie Loll will allow overbook and then will call Pt back.

## 2023-03-03 NOTE — Progress Notes (Signed)
Attempted call to patient regarding ICM remote transmission and left message to send updated Optivol transmission from home monitor.  Left number if assistance is needed.

## 2023-03-03 NOTE — Telephone Encounter (Signed)
completed

## 2023-03-03 NOTE — Progress Notes (Signed)
Spoke with patient and advised she does not need to take a 2nd Metolazone today per [place].  Advised to call the HF clinic directly if she has any changes in condition next week due to I will be out of the office.

## 2023-03-03 NOTE — Progress Notes (Signed)
  Received: Today [place], [name], FNP  Camdyn Beske, Josephine Igo, RN Sorry did not see she took metolazone yesterday, no we can hold off.

## 2023-03-03 NOTE — Progress Notes (Signed)
Spoke with patient and heart failure questions reviewed.  Transmission results reviewed.    Pt confirmed she took Metolazone + Potassium yesterday, 8/34, as planned and weight dropped 5 lbs (192 lbs to 187 lbs) and swelling in feet slightly improved but still difficult to wear shoes.  9/25 Optivol report suggesting fluid levels have returned close to baseline after taking 1 Metolazone with Potassium on 9/24.      Copy sent to Prince Rome, NP for review before taking a 2nd Metolazone dose as recommended 9/25.

## 2023-03-03 NOTE — Progress Notes (Signed)
  Received: Today Milford, [name], FNP  Minie Roadcap, Josephine Igo, RN Please take metolazone 2.5 mg + extra 40 KCL x 1 today.

## 2023-03-05 ENCOUNTER — Encounter (HOSPITAL_COMMUNITY): Payer: Self-pay

## 2023-03-05 ENCOUNTER — Ambulatory Visit: Payer: Self-pay | Admitting: Licensed Clinical Social Worker

## 2023-03-05 ENCOUNTER — Ambulatory Visit (HOSPITAL_COMMUNITY)
Admission: RE | Admit: 2023-03-05 | Discharge: 2023-03-05 | Disposition: A | Discharge status: Home / Self Care | Payer: Medicare Other | Source: Ambulatory Visit | Attending: Physician Assistant

## 2023-03-05 VITALS — BP 130/64 | HR 72 | Wt 191.4 lb

## 2023-03-05 DIAGNOSIS — E669 Obesity, unspecified: Secondary | ICD-10-CM | POA: Diagnosis not present

## 2023-03-05 DIAGNOSIS — J9611 Chronic respiratory failure with hypoxia: Secondary | ICD-10-CM | POA: Diagnosis not present

## 2023-03-05 DIAGNOSIS — Z79899 Other long term (current) drug therapy: Secondary | ICD-10-CM | POA: Diagnosis not present

## 2023-03-05 DIAGNOSIS — G4733 Obstructive sleep apnea (adult) (pediatric): Secondary | ICD-10-CM

## 2023-03-05 DIAGNOSIS — R5383 Other fatigue: Secondary | ICD-10-CM | POA: Diagnosis not present

## 2023-03-05 DIAGNOSIS — I959 Hypotension, unspecified: Secondary | ICD-10-CM | POA: Insufficient documentation

## 2023-03-05 DIAGNOSIS — I5042 Chronic combined systolic (congestive) and diastolic (congestive) heart failure: Secondary | ICD-10-CM | POA: Diagnosis present

## 2023-03-05 DIAGNOSIS — J398 Other specified diseases of upper respiratory tract: Secondary | ICD-10-CM | POA: Insufficient documentation

## 2023-03-05 DIAGNOSIS — Z6837 Body mass index (BMI) 37.0-37.9, adult: Secondary | ICD-10-CM | POA: Insufficient documentation

## 2023-03-05 DIAGNOSIS — N321 Vesicointestinal fistula: Secondary | ICD-10-CM | POA: Diagnosis not present

## 2023-03-05 DIAGNOSIS — Z9981 Dependence on supplemental oxygen: Secondary | ICD-10-CM | POA: Insufficient documentation

## 2023-03-05 DIAGNOSIS — I130 Hypertensive heart and chronic kidney disease with heart failure and stage 1 through stage 4 chronic kidney disease, or unspecified chronic kidney disease: Secondary | ICD-10-CM | POA: Diagnosis not present

## 2023-03-05 DIAGNOSIS — I4811 Longstanding persistent atrial fibrillation: Secondary | ICD-10-CM | POA: Insufficient documentation

## 2023-03-05 DIAGNOSIS — I5022 Chronic systolic (congestive) heart failure: Secondary | ICD-10-CM | POA: Diagnosis not present

## 2023-03-05 DIAGNOSIS — I2510 Atherosclerotic heart disease of native coronary artery without angina pectoris: Secondary | ICD-10-CM | POA: Diagnosis not present

## 2023-03-05 DIAGNOSIS — I4819 Other persistent atrial fibrillation: Secondary | ICD-10-CM

## 2023-03-05 DIAGNOSIS — Z7901 Long term (current) use of anticoagulants: Secondary | ICD-10-CM | POA: Insufficient documentation

## 2023-03-05 DIAGNOSIS — Z602 Problems related to living alone: Secondary | ICD-10-CM | POA: Insufficient documentation

## 2023-03-05 LAB — BASIC METABOLIC PANEL
Anion gap: 11 (ref 5–15)
BUN: 44 mg/dL — ABNORMAL HIGH (ref 8–23)
CO2: 27 mmol/L (ref 22–32)
Calcium: 9.4 mg/dL (ref 8.9–10.3)
Chloride: 93 mmol/L — ABNORMAL LOW (ref 98–111)
Creatinine, Ser: 1.14 mg/dL — ABNORMAL HIGH (ref 0.44–1.00)
GFR, Estimated: 50 mL/min — ABNORMAL LOW (ref >60)
Glucose, Bld: 120 mg/dL — ABNORMAL HIGH (ref 70–99)
Potassium: 3.1 mmol/L — ABNORMAL LOW (ref 3.5–5.1)
Sodium: 131 mmol/L — ABNORMAL LOW (ref 135–145)

## 2023-03-05 NOTE — Progress Notes (Signed)
ADVANCED HF CLINIC NOTE   Primary Care: [name], MD Primary Cardiologist: Dr. Jeralyn Ruths ([facility]) HF Cardiologist: Dr. Gala Romney  HPI: Kathleen [name] is a 77 y.o. 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 Recently moved from [place] to [place] and referred by Dr. Jeralyn Ruths to establish cardiology care.   Previous echo with EF 40% but EF recovered. Echo ([facility]) 8/22 EF > 55% RV normal   She has been followed by Dr. Wilford Grist and Jeralyn Ruths at [facility]. Has maintained NSR on Tikosyn.   RHC + LHC (01/17/20): RA 12, PA mean 29, PCWP 13, CI 2.2, Coronary angiogram without any stenosis  RHC (02/08/20): RA 8, PAM 24, wedge 16, CI 2.5  Followed by Dr. Francine Graven in Pulmonary for her BOOP.    Saw [place] in HF Clinic for the fist time in 9/22.  Zio 11/22: Predominantly AF with v-pacing with periods of sinus rhythm with V-pacing - avg HR of 75 bpm. 2. 2. Rare PACs and PVCs.   Follow up 1/23 stable NYHA III, volume OK on torsemide 2x/week and PRN.  Seen in AF clinic 3/23, continued on dofetilide 250 bid.  Admitted 8/7-13/2024 with COPD flare.   Admitted 02/22/23 with acute diverticulitis complicated by colovesical fistua. HF stable. Kathleen [name] and losartan stopped but she continued to take losartan. Discharged 02/25/23.   Today she returns for post HF follow up with her son. Complaining of fatigue and dizziness. Complaining of constipation. Complaining of abdominal pain intermittently and constipation. SOB with exertion. Denies PND/Orthopnea. Continues to wear 4-5 liters of oxygen. Appetite ok. No fever or chills. Weight at home has gone down 192-->187 pounds. pounds. Took metolazone last week. Weight went down 5 pounds. Taking all medications. Lives in  independent living.   Cardiac Studies - Echo 4/24 EF 35-40% RV ok  - Echo 10/23 and 11/23 EF 35-40% (Dr. Gala Romney felt  40-45%). Having episodes of CP that can wake her up from sleep. No exertional CP.   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     amoxicillin-clavulanate (AUGMENTIN) 875-125 MG tablet Take 1 tablet by mouth 2 (two) times daily for 14 days. 28 tablet 0   azelastine (ASTELIN) 0.1 % nasal spray Place 2 sprays into both nostrils 2 (two) times daily. 90 mL 0   benzonatate (TESSALON) 200 MG capsule Take 1 capsule (200 mg total) by mouth 3 (three) times daily as needed. 45 capsule 3   Cholecalciferol (VITAMIN D3) 50 MCG (2000 UT) TABS Take 2,000 Units by mouth at bedtime.     cyanocobalamin (VITAMIN B12) 1000 MCG/ML injection Inject  1 mL (1,000 mcg total) into the muscle every 30 (thirty) days. 10 mL 0   dextromethorphan (DELSYM) 30 MG/5ML liquid Take 10 mLs by mouth as needed for cough.     docusate sodium (COLACE) 100 MG capsule Take 100 mg by mouth 2 (two) times daily.     dofetilide (TIKOSYN) 250 MCG capsule Take 1 capsule (250 mcg total) by mouth in the morning and at bedtime. 60 capsule 6   fluticasone furoate-vilanterol (BREO ELLIPTA) 100-25 MCG/ACT AEPB Inhale 1 puff into the lungs daily. 30 each 5   gabapentin (NEURONTIN) 100 MG capsule Take 2 capsules (200 mg total) by mouth at bedtime. 60  capsule 6   HYDROcodone-acetaminophen (NORCO/VICODIN) 5-325 MG tablet Take 1 tablet by mouth every 6 (six) hours as needed for severe pain. 15 tablet 0   levalbuterol (XOPENEX) 0.63 MG/3ML nebulizer solution Take 3 mLs (0.63 mg total) by nebulization every 6 (six) hours as needed for wheezing or shortness of breath. DX J44.89 J96.21 3 mL 12   losartan (COZAAR) 25 MG tablet Take 25 mg by mouth daily.     Magnesium 500 MG TABS Take 500 mg by mouth at bedtime.     metolazone (ZAROXOLYN) 2.5 MG tablet Take 1 tablet (2.5 mg total) by mouth as needed (for swelling or SOB). MUST TAKE OF POTASSIUM WITH THIS 10 tablet 0   OXYGEN Inhale 5 L into the lungs continuous. Use with resmed ventilator     potassium chloride (KLOR-CON) 10 MEQ tablet Take 1 tablet (10 mEq total) by mouth daily. 90 tablet 3   potassium chloride SA (KLOR-CON M) 20 MEQ tablet Take 20 mEq by mouth 2 (two) times daily as needed (when she takes super water pill).     revefenacin (YUPELRI) 175 MCG/3ML nebulizer solution Take 175 mcg by nebulization daily.     rivaroxaban (XARELTO) 20 MG TABS tablet Take 1 tablet (20 mg total) by mouth daily with supper. 30 tablet 5   torsemide (DEMADEX) 20 MG tablet Take 1 tablet (20 mg total) by mouth daily. 90 tablet 2   No current facility-administered medications for this encounter.   Allergies  Allergen Reactions   Allopurinol Other (See Comments)    Reaction:  Dizziness    Clindamycin Anaphylaxis and Hives   Flublok [Influenza Vaccine Recombinant] Other (See Comments)    Fever 103 with no alternative explanation day after vaccine.  Clydie Braun Highfill FNP-C   Pneumococcal 13-Val Conj Vacc Itching, Swelling and Rash   Dronedarone Rash   Montelukast Other (See Comments)    Makes her loopy.   Brovana [Arformoterol]     Caused muscle pain   Budesonide     Caused extreme joint pain   Entresto [Sacubitril-Valsartan] Other (See Comments)    hypotension   Fosamax [Alendronate Sodium] Nausea Only    Jardiance [Empagliflozin]     Caused a vaginal infection   Meperidine Nausea And Vomiting   Microplegia Msa-Msg [Plegisol]     Loopy,diarrhea   Rosuvastatin Other (See Comments)    Reaction:  Muscle spasms    Tetracycline Hives   Adhesive [Tape] Rash   Lovastatin Rash and Other (See Comments)    Muscle Pain   Social History   Socioeconomic History   Marital status: Widowed    Spouse name: Not on file   Number of children: 1   Years of education: Not on file   Highest education level: Not on file  Occupational History   Occupation: retired  Tobacco Use  Smoking status: Former    Current packs/day: 0.00    Average packs/day: 1 pack/day for 20.0 years (20.0 ttl pk-yrs)    Types: Cigarettes    Start date: 06/09/1967    Quit date: 06/09/1987    Years since quitting: 35.7    Passive exposure: Past   Smokeless tobacco: Never   Tobacco comments:    Former smoker 12/25/21  Vaping Use   Vaping status: Never Used  Substance and Sexual Activity   Alcohol use: No    Alcohol/week: 0.0 standard drinks of alcohol   Drug use: No   Sexual activity: Not Currently  Other Topics Concern   Not on file  Social History Narrative   Pt lives in [place] alone.  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  Her son works in [employer].   Social Determinants of Health   Financial Resource Strain: Low Risk  (05/21/2022)   Overall Financial Resource Strain (CARDIA)    Difficulty of Paying Living Expenses: Not very hard  Food Insecurity: No Food Insecurity (01/20/2023)   Hunger Vital Sign    Worried About Running Out of Food in the Last Year: Never true    Ran Out of Food in the Last Year: Never true  Transportation Needs: No Transportation Needs (01/20/2023)   PRAPARE - Administrator, Civil Service (Medical): No    Lack of Transportation (Non-Medical): No  Physical Activity: Insufficiently Active (05/21/2022)   Exercise Vital Sign    Days of Exercise per Week: 2 days     Minutes of Exercise per Session: 20 min  Stress: No Stress Concern Present (05/21/2022)   [employer] of Occupational Health - Occupational Stress Questionnaire    Feeling of Stress : Only a little  Social Connections: Moderately Isolated (05/21/2022)   Social Connection and Isolation Panel [NHANES]    Frequency of Communication with Friends and Family: More than three times a week    Frequency of Social Gatherings with Friends and Family: More than three times a week    Attends Religious Services: Never    Database administrator or Organizations: Yes    Attends Engineer, structural: More than 4 times per year    Marital Status: Widowed  Intimate Partner Violence: Not At Risk (01/14/2023)   Humiliation, Afraid, Rape, and Kick questionnaire    Fear of Current or Ex-Partner: No    Emotionally Abused: No    Physically Abused: No    Sexually Abused: No   Family History  Problem Relation Age of Onset   Breast cancer Mother 39       3 different times   Pancreatic cancer Mother    Emphysema Father    Healthy Brother    Breast cancer Cousin    Valvular heart disease Son    Obesity Daughter    Colon cancer Neg Hx    Esophageal cancer Neg Hx    Stomach cancer Neg Hx    Inflammatory bowel disease Neg Hx    Liver disease Neg Hx    Rectal cancer Neg Hx    BP 130/64   Pulse 72   Wt 86.8 kg (191 lb 6.4 oz)   SpO2 94% Comment: 4 & 1/2 L of O2  BMI 37.38 kg/m   Wt Readings from Last 3 Encounters:  03/05/23 86.8 kg (191 lb 6.4 oz)  02/12/23 86.9 kg (191 lb 9.6 oz)  02/04/23 88 kg (194 lb)   PHYSICAL EXAM:  General:  Walked in with a walker. Wearing oxygen. No resp difficulty HEENT: normal Neck: supple. no JVD. Carotids 2+ bilat; no bruits. No lymphadenopathy or thryomegaly appreciated. Cor: PMI nondisplaced. Regular rate & rhythm. No rubs, gallops or murmurs. Lungs: clear on 4 liters [place].  Abdomen: obese, soft, nontender, nondistended. No hepatosplenomegaly. No  bruits or masses. Good bowel sounds. Extremities: no cyanosis, clubbing, rash, edema Neuro: alert & orientedx3, cranial nerves grossly intact. moves all 4 extremities w/o difficulty. Affect pleasant  EKG : AV Paced 70 bpm   Device interrogation: Activity 0.3 hours per day. Impedance well above threshold. Fluid index flat.    ASSESSMENT & PLAN:   1. Chronic systolic/diastolic HF - Echo (1/61): EF 09%. RV ok  - Echo (10/23) and (11/23) EF 35-40% (I thought 40-45%) - Echo 4/24 EF 35-40% RV ok Personally reviewed - R/L cath (10/23):  minimal CAD.  - Chronic NYHA IIIB, mostly due to lung disease and obesity. - Volume status elevated last week. On exam she appears dry today and Optivol look ok with low fluid index. Hold torsemide x 2 days then start torsemide 20 mg daily. Discussed low salt food choices.  - Continue losartan 25 mg qhs (failed Entresto due to low BP) - Continue to hold spiro . May be able to start soon. Consider at next visit.  - No b-blocker with recent low BPs and severe lung disease. - No Jardiance with frequent UTIs. - Check BMET and BNP.   2. 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 Francine Graven) - Followed by Dr. Vassie Loll  3. Persistent AF - s/p AVN ablation and PM (failed upgrade to BiV) - maintaining NSR on Tikosyn (Zio showed significant AF burden).  - Followed by EP/AF Clinic - Continue Xarelto. No bleeding issues.   4. Morbid obesity  - Body mass index is 37.38 kg/m. - Discussed portion control.   5. OSA - Continue Bipap  6. Colovesical Fistula Followed by GI and general surgery  Follow up in 4 weeks.   Tonye Becket, NP  2:11 PM

## 2023-03-05 NOTE — Patient Instructions (Signed)
Medication Changes:  DO NOT TAKE Torsemide Saturday or Sunday, Monday 9/30 restart at 20 mg daily  Lab Work:  Labs done today, your results will be available in MyChart, we will contact you for abnormal readings.  Testing/Procedures:  none  Referrals:  none  Special Instructions // Education:  Do the following things EVERYDAY: Weigh yourself in the morning before breakfast. Write it down and keep it in a log. Take your medicines as prescribed Eat low salt foods--Limit salt (sodium) to 2000 mg per day.  Stay as active as you can everyday Limit all fluids for the day to less than 2 liters   Follow-Up in: 4 weeks  At the [facility], you and your health needs are our priority. We have a designated team specialized in the treatment of Heart Failure. This Care Team includes your primary Heart Failure Specialized Cardiologist (physician), Advanced Practice Providers (APPs- Physician Assistants and Nurse Practitioners), and Pharmacist who all work together to provide you with the care you need, when you need it.   You may see any of the following providers on your designated Care Team at your next follow up:  Dr. Arvilla Meres Dr. Marca Ancona Dr. Marcos Eke, NP Robbie Lis, [place] [facility] [place], [place] Brynda Peon, NP Karle Plumber, PharmD   Please be sure to bring in all your medications bottles to every appointment.   Need to Contact us:  If you have any questions or concerns before your next appointment please send [place] a message through [place] or call our office at [phone].    TO LEAVE A MESSAGE FOR THE NURSE SELECT OPTION 2, PLEASE LEAVE A MESSAGE INCLUDING: YOUR NAME DATE OF BIRTH CALL BACK NUMBER REASON FOR CALL**this is important as we prioritize the call backs  YOU WILL RECEIVE A CALL BACK THE SAME DAY AS LONG AS YOU CALL BEFORE 4:00 PM

## 2023-03-08 NOTE — Patient Instructions (Signed)
Visit Information  Thank you for taking time to visit with me today. Please don't hesitate to contact me if I can be of assistance to you.   Following are the goals we discussed today:   Goals Addressed             This Visit's Progress    Obtain Supportive Resources-Housing   On track    Activities and task to complete in order to accomplish goals.   Keep all upcoming appointments discussed today Continue with compliance of taking medication prescribed by Doctor Implement healthy coping skills discussed to assist with management of symptoms         Our next appointment is by telephone on 10/11 at 10 AM  Please call the care guide team at [phone] if you need to cancel or reschedule your appointment.   If you are experiencing a Mental Health or Behavioral Health Crisis or need someone to talk to, please call the Suicide and Crisis Lifeline: 988 call 911   Patient verbalizes understanding of instructions and care plan provided today and agrees to view in MyChart. Active MyChart status and patient understanding of how to access instructions and care plan via MyChart confirmed with patient.     Jenel Lucks, MSW, LCSW [facility] Care Management [place]  [facility] [email or web address] Phone [phone] 6:15 PM

## 2023-03-08 NOTE — Patient Outreach (Signed)
Care Coordination   Initial Visit Note   03/05/2023 Name: Kathleen [name] MRN: [id number] DOB: 18-Mar-1946  Kathleen [name] is a 77 y.o. year old female who sees Kathleen Grandchild, MD for primary care. I spoke with  Kathleen [name] by phone today.  What matters to the patients health and wellness today?  Housing    Goals Addressed             This Visit's Progress    Obtain Supportive Resources-Housing   On track    Activities and task to complete in order to accomplish goals.   Keep all upcoming appointments discussed today Continue with compliance of taking medication prescribed by Doctor Implement healthy coping skills discussed to assist with management of symptoms         SDOH assessments and interventions completed:  No     Care Coordination Interventions:  Yes, provided  Interventions Today    Flowsheet Row Most Recent Value  Chronic Disease   Chronic disease during today's visit Hypertension (HTN), Chronic Obstructive Pulmonary Disease (COPD), Atrial Fibrillation (AFib), Chronic Kidney Disease/End Stage Renal Disease (ESRD)  General Interventions   General Interventions Discussed/Reviewed General Interventions Discussed, Community Resources, Level of Care  Doctor Visits Discussed/Reviewed Doctor Visits Discussed  Level of Care Assisted Living  Mental Health Interventions   Mental Health Discussed/Reviewed Mental Health Discussed  Nutrition Interventions   Nutrition Discussed/Reviewed Nutrition Discussed  Pharmacy Interventions   Pharmacy Dicussed/Reviewed Pharmacy Topics Reviewed, Medication Adherence  Safety Interventions   Safety Discussed/Reviewed Safety Discussed       Follow up plan: Follow up call scheduled for 4-6 weeks    Encounter Outcome:  Patient Visit Completed   Jenel Lucks, MSW, LCSW [facility], [place] Care Management [facility]  [facility] [email or web address] Phone [phone] 6:14 PM

## 2023-03-10 ENCOUNTER — Other Ambulatory Visit: Payer: Medicare Other | Admitting: Internal Medicine

## 2023-03-15 ENCOUNTER — Ambulatory Visit: Payer: Medicare Other | Attending: Cardiology

## 2023-03-15 DIAGNOSIS — I5022 Chronic systolic (congestive) heart failure: Secondary | ICD-10-CM

## 2023-03-15 DIAGNOSIS — Z950 Presence of cardiac pacemaker: Secondary | ICD-10-CM

## 2023-03-16 NOTE — Progress Notes (Unsigned)
EPIC Encounter for ICM Monitoring  Patient Name: Kathleen [name] is a 77 y.o. female Date: 03/16/2023 Primary Care Physican: [name], MD Primary Cardiologist: Bensimhon Electrophysiologist: Camnitz Bi-V Pacing: 100%          07/28/2022 Weight: 184.6 lbs 09/07/2022   Weight: 187 lbs 11/17/2022 Weight: 190-191 lbs    02/04/2023 Office: 194 lbs     03/16/2023 Weight: 190 lbs                                             Spoke with patient and heart failure questions reviewed.  Transmission results reviewed.  Pt continues to have swelling in feet and is SOB with minimal exertion walking from room to room.   Spent 2 days in bed due to diverticulitis in the past week.  Has GI appt 10/9.     Diet:  She lives in an assisted living  Eats foods that are prepared at the facility she lives in which are not low salt.     Optivol thoracic impedance suggesting possible fluid accumulation starting 9/27.     Prescribed:  Torsemide 20 mg take 1 tablet(s) (20 mg total) by mouth daily.   Potassium 20 mEq take 1 tablet (20 mEq total) by mouth two times daily as needed when she takes super water pill Metolazone 2.5 mg take 1 tablet by mouth as needed (for swelling or SOB).  Must take 40 mEq of Potassium with this.    Labs: Per 03/15/2023 lab note patient needs a follow up BMET but is not scheduled  03/05/2023 Creatinine 1.14, BUN 44, Potassium 3.1, Sodium 131, GFR 50 02/25/2023 Creatinine 0.91, BUN 11, Potassium 3.7, Sodium 135, GFR >60  02/24/2023 Creatinine 0.83, BUN   7, Potassium 3.7, Sodium 135, GFR >60  02/23/2023 Creatinine 0.90, BUN 15, Potassium 4.1, Sodium 132, GFR >60  02/22/2023 Creatinine 0.91, BUN 17, Potassium 3.1, Sodium 136, GFR >60 01/27/2023 Creatinine 0.86, BUN 32, Potassium 4.2, Sodium 139 01/16/2023 Creatinine 0.91, BUN 40, Potassium 4.0, Sodium 137, GFR >60  01/14/2023 Creatinine 0.73, BUN 19, Potassium 4.0, Sodium 137, GFR >60  01/13/2023 Creatinine 0.68, BUN 22, Potassium 4.5,  Sodium 139, GFR >60  09/15/2022 Creatinine 0.84, BUN 19, Potassium 4.3, Sodium 139, GFR >60 A complete set of results can be found in Results Review.   Recommendations:  Has not taken any Metolazone since 9/25.  Confirmed taking Torsemide 20 mg daily.      Copy sent to Tonye Becket, NP for review and recommendations if needed.     Follow-up plan: ICM clinic phone appointment on 03/22/2023 to recheck fluid levels.  91 day device clinic remote transmission 03/31/2023.     EP/Cardiology Office Visits:   04/01/2023 with HF clinic.   Recall 07/14/2023 with Dr Gala Romney.  Recall 05/13/2023 with Francis Dowse, PA or Otilio Saber, [place].     Copy of ICM check sent to Dr. Elberta Fortis.   3 month ICM trend: 03/15/2023.    12-14 Month ICM trend:   ***  Karie Soda, RN 03/16/2023 12:15 PM

## 2023-03-17 ENCOUNTER — Other Ambulatory Visit: Payer: Medicare Other

## 2023-03-17 ENCOUNTER — Encounter: Payer: Self-pay | Admitting: Gastroenterology

## 2023-03-17 ENCOUNTER — Other Ambulatory Visit (INDEPENDENT_AMBULATORY_CARE_PROVIDER_SITE_OTHER): Payer: Medicare Other

## 2023-03-17 ENCOUNTER — Telehealth (HOSPITAL_COMMUNITY): Payer: Self-pay | Admitting: Adult Health

## 2023-03-17 ENCOUNTER — Ambulatory Visit (INDEPENDENT_AMBULATORY_CARE_PROVIDER_SITE_OTHER): Payer: Medicare Other | Admitting: Gastroenterology

## 2023-03-17 VITALS — BP 122/72 | HR 81 | Ht 60.0 in | Wt 191.2 lb

## 2023-03-17 DIAGNOSIS — K5792 Diverticulitis of intestine, part unspecified, without perforation or abscess without bleeding: Secondary | ICD-10-CM | POA: Diagnosis not present

## 2023-03-17 DIAGNOSIS — N824 Other female intestinal-genital tract fistulae: Secondary | ICD-10-CM

## 2023-03-17 DIAGNOSIS — R110 Nausea: Secondary | ICD-10-CM | POA: Diagnosis not present

## 2023-03-17 DIAGNOSIS — D508 Other iron deficiency anemias: Secondary | ICD-10-CM | POA: Diagnosis not present

## 2023-03-17 DIAGNOSIS — R300 Dysuria: Secondary | ICD-10-CM

## 2023-03-17 DIAGNOSIS — Z7901 Long term (current) use of anticoagulants: Secondary | ICD-10-CM

## 2023-03-17 DIAGNOSIS — D509 Iron deficiency anemia, unspecified: Secondary | ICD-10-CM | POA: Diagnosis not present

## 2023-03-17 DIAGNOSIS — D649 Anemia, unspecified: Secondary | ICD-10-CM | POA: Insufficient documentation

## 2023-03-17 DIAGNOSIS — R1032 Left lower quadrant pain: Secondary | ICD-10-CM

## 2023-03-17 DIAGNOSIS — J9611 Chronic respiratory failure with hypoxia: Secondary | ICD-10-CM

## 2023-03-17 LAB — CBC
HCT: 35.5 % — ABNORMAL LOW (ref 36.0–46.0)
Hemoglobin: 11.6 g/dL — ABNORMAL LOW (ref 12.0–15.0)
MCHC: 32.7 g/dL (ref 30.0–36.0)
MCV: 82.2 fL (ref 78.0–100.0)
Platelets: 295 10*3/uL (ref 150.0–400.0)
RBC: 4.32 Mil/uL (ref 3.87–5.11)
RDW: 15.5 % (ref 11.5–15.5)
WBC: 11.6 10*3/uL — ABNORMAL HIGH (ref 4.0–10.5)

## 2023-03-17 LAB — BASIC METABOLIC PANEL
BUN: 9 mg/dL (ref 6–23)
CO2: 26 meq/L (ref 19–32)
Calcium: 9.4 mg/dL (ref 8.4–10.5)
Chloride: 103 meq/L (ref 96–112)
Creatinine, Ser: 0.65 mg/dL (ref 0.40–1.20)
GFR: 85.64 mL/min (ref >60.00)
Glucose, Bld: 104 mg/dL — ABNORMAL HIGH (ref 70–99)
Potassium: 3.3 meq/L — ABNORMAL LOW (ref 3.5–5.1)
Sodium: 141 meq/L (ref 135–145)

## 2023-03-17 LAB — URINALYSIS WITH CULTURE, IF INDICATED
Bilirubin Urine: NEGATIVE
Ketones, ur: NEGATIVE
Nitrite: NEGATIVE
Specific Gravity, Urine: 1.01 (ref 1.000–1.030)
Urine Glucose: NEGATIVE
Urobilinogen, UA: 0.2 (ref 0.0–1.0)
pH: 6 (ref 5.0–8.0)

## 2023-03-17 LAB — C-REACTIVE PROTEIN: CRP: 1.6 mg/dL (ref 0.5–20.0)

## 2023-03-17 LAB — SEDIMENTATION RATE: Sed Rate: 54 mm/h — ABNORMAL HIGH (ref 0–30)

## 2023-03-17 MED ORDER — TORSEMIDE 20 MG PO TABS: 40.0000 mg | ORAL_TABLET | Freq: Every day | ORAL | 2 refills | Status: DC

## 2023-03-17 MED ORDER — POTASSIUM CHLORIDE CRYS ER 20 MEQ PO TBCR: 40.0000 meq | EXTENDED_RELEASE_TABLET | Freq: Every day | ORAL | 2 refills | Status: DC

## 2023-03-17 MED ORDER — NA SULFATE-K SULFATE-MG SULF 17.5-3.13-1.6 GM/177ML PO SOLN: 1.0000 | ORAL | 0 refills | Status: DC

## 2023-03-17 NOTE — Telephone Encounter (Signed)
  3 month ICM trend: 03/15/2023.      Suggestive of fluid accumulations.   Please call and instruct to increase torsemide to 40 mg daily . Increase K DUr to 40 meq daily. Please set up for BMET in 7 days.   Marnesha Gagen NP-C  1:13 PM

## 2023-03-17 NOTE — Telephone Encounter (Signed)
See ICM note for notifying patient of med changes.

## 2023-03-17 NOTE — Patient Instructions (Addendum)
You have been scheduled for a CT scan of the abdomen and pelvis at [facility], 1st floor Radiology. You are scheduled on 03/24/23 at 2:30 pm . You should arrive 2 hours and 15 min  prior to your appointment time at 12:15 pm  for registration and to drink oral contrast.   Please follow the written instructions below on the day of your exam:   1) Do not eat anything after 10:30 am  (4 hours prior to your test)    You may take any medications as prescribed with a small amount of water, if necessary. If you take any of the following medications: METFORMIN, GLUCOPHAGE, GLUCOVANCE, AVANDAMET, RIOMET, FORTAMET, ACTOPLUS MET, JANUMET, GLUMETZA or METAGLIP, you MAY be asked to HOLD this medication 48 hours AFTER the exam.   The purpose of you drinking the oral contrast is to aid in the visualization of your intestinal tract. The contrast solution may cause some diarrhea. Depending on your individual set of symptoms, you may also receive an intravenous injection of x-ray contrast/dye. Plan on being at [facility] for 45 minutes or longer, depending on the type of exam you are having performed.   If you have any questions regarding your exam or if you need to reschedule, you may call [facility] at [phone] between the hours of 8:00 am and 5:00 pm, Monday-Friday.   We have given you samples of the following medication to take: Trulance - take 1 pill by mouth once daily.   We have sent the following medications to your pharmacy for you to pick up at your convenience: Suprep   Your provider has requested that you go to the basement level for lab work before leaving today. Press "B" on the elevator. The lab is located at the first door on the left as you exit the elevator.  Due to recent changes in healthcare laws, you may see the results of your imaging and laboratory studies on MyChart before your provider has had a chance to review them.  We understand that in some cases there may  be results that are confusing or concerning to you. Not all laboratory results come back in the same time frame and the provider may be waiting for multiple results in order to interpret others.  Please give [place] 48 hours in order for your provider to thoroughly review all the results before contacting the office for clarification of your results.   _______________________________________________________  If your blood pressure at your visit was 140/90 or greater, please contact your primary care physician to follow up on this.  _______________________________________________________  If you are age 76 or older, your body mass index should be between 23-30. Your Body mass index is 37.35 kg/m. If this is out of the aforementioned range listed, please consider follow up with your Primary Care Provider.  If you are age 4 or younger, your body mass index should be between 19-25. Your Body mass index is 37.35 kg/m. If this is out of the aformentioned range listed, please consider follow up with your Primary Care Provider.   ________________________________________________________  The [place] [facility] providers would like to encourage you to use [facility] to communicate with providers for non-urgent requests or questions.  Due to long hold times on the telephone, sending your provider a message by [facility] may be a faster and more efficient way to get a response.  Please allow 48 business hours for a response.  Please remember that this is for non-urgent requests.  _______________________________________________________  Thank you for choosing me and [facility].  Dr. Meridee Score

## 2023-03-17 NOTE — Progress Notes (Signed)
GASTROENTEROLOGY OUTPATIENT CLINIC VISIT   Primary Care Provider [name], MD [address] [place] [address] [phone]   Patient Profile: Kathleen [name] is a 77 y.o. 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 fib (status post prior AVN ablation and PPM on anticoagulation), hypertension, gout, CRI, iron deficiency, family history of pancreas cancer (mother), colon polyps (found on CT colonography in 2021), diverticulitis (complicated with colovaginal fistula).  The patient presents to the [facility] for an evaluation and management of problem(s) noted below:  Problem List 1. Diverticulitis   2. Colovaginal fistula   3. Other iron deficiency anemia   4. Chronic nausea   5. LLQ pain   6. Dysuria   7. Chronic respiratory failure with hypoxia (HCC)   8. Chronic anticoagulation    Discussed the use of AI scribe software for clinical note transcription with the patient, who gave verbal consent to proceed.  History of Present Illness Please see prior notes for full details of HPI.  Interval History The patient presents for follow-up.  She had a recent hospitalization with presumed complicated diverticulitis.  She was found to have persisting iron deficiency anemia as well.  Her complicated diverticulitis looks to suggest a suspected fistula between the colon and vagina, which was suggested by imaging during her hospital stay but also with her having had acute onset of discomfort and what initially appeared to be stool from the vagina.  She describes a recurring LLQ pain at times for the last few months but nothing that she felt was too severe at the time.  However, on the day she presented to the hospital, she noted feces and gas with worsened pain.  She completed antibiotics for 2 weeks after her short hospitalization and she is awaiting a clinic evaluation  with Colorectal surgery in the coming weeks.  She has been doing better with regards to pain until the last couple of days.  Now she is having more discomfort that is sharp and stabbing at times.  She denies any fevers or chills.  She is also experiencing a daily nausea symptom.  Previously she had described dysphagia symptoms but that does not appear to be an issue currently.  She describes very minimal stools per rectum and rather coming through the vagina.  The feces that do come through the rectum are small, thin, and sludge-like.  No blood has been noted at any time point.  The patient also reported an increase in gas production, which was causing discomfort when passed through the vagina.  She is taking two stool softeners per day.  MiraLAX has not been helpful in the past.  145 mcg Linzess was too powerful for her.  72 mcg Linzess wasn't completely helpful in the past per her report.  She is having some dysuria symptoms but has not noted any change in her urine color per report.  She wonders if she may have a UTI.  The patient is currently living in an independent living facility and has transportation limitations for medical appointments concerns.   GI Review of Systems Positive as above Negative for odynophagia, vomiting  Review of Systems General: Denies fevers/chills Cardiovascular: Denies chest pain Pulmonary: SOB at baseline per her report (remains on 5 L O2) Gastroenterological: See HPI Genitourinary: Denies darkened urine or hematuria Hematological: Positive for history of easy bruising/bleeding due to anticoagulation Dermatological: Denies jaundice Psychological: Mood is anxious  Medications Current Outpatient Medications  Medication Sig Dispense Refill   acetaminophen (TYLENOL) 650 MG CR tablet Take 1,300 mg by mouth every 8 (eight) hours as needed for pain.     azelastine (ASTELIN) 0.1 % nasal spray Place 2 sprays into both nostrils 2 (two) times daily. 90 mL 0    Cholecalciferol (VITAMIN D3) 50 MCG (2000 UT) TABS Take 2,000 Units by mouth at bedtime.     cyanocobalamin (VITAMIN B12) 1000 MCG/ML injection Inject 1 mL (1,000 mcg total) into the muscle every 30 (thirty) days. 10 mL 0   dextromethorphan (DELSYM) 30 MG/5ML liquid Take 10 mLs by mouth as needed for cough.     docusate sodium (COLACE) 100 MG capsule Take 100 mg by mouth 2 (two) times daily.     dofetilide (TIKOSYN) 250 MCG capsule Take 1 capsule (250 mcg total) by mouth in the morning and at bedtime. 60 capsule 6   fluticasone furoate-vilanterol (BREO ELLIPTA) 100-25 MCG/ACT AEPB Inhale 1 puff into the lungs daily. 30 each 5   gabapentin (NEURONTIN) 100 MG capsule Take 2 capsules (200 mg total) by mouth at bedtime. 60 capsule 6   HYDROcodone-acetaminophen (NORCO/VICODIN) 5-325 MG tablet Take 1 tablet by mouth every 6 (six) hours as needed for severe pain. 15 tablet 0   levalbuterol (XOPENEX) 0.63 MG/3ML nebulizer solution Take 3 mLs (0.63 mg total) by nebulization every 6 (six) hours as needed for wheezing or shortness of breath. DX J44.89 J96.21 3 mL 12   losartan (COZAAR) 25 MG tablet Take 12.5 mg by mouth daily.     Magnesium 500 MG TABS Take 500 mg by mouth at bedtime.     metolazone (ZAROXOLYN) 2.5 MG tablet Take 1 tablet (2.5 mg total) by mouth as needed (for swelling or SOB). MUST TAKE OF POTASSIUM WITH THIS 10 tablet 0   Na Sulfate-K Sulfate-Mg Sulf (SUPREP BOWEL PREP KIT) 17.5-3.13-1.6 GM/177ML SOLN Take 1 kit by mouth as directed. For colonoscopy prep 354 mL 0   OXYGEN Inhale 5 L into the lungs continuous. Use with resmed ventilator     potassium chloride (KLOR-CON) 10 MEQ tablet Take 1 tablet (10 mEq total) by mouth daily. 90 tablet 3   potassium chloride SA (KLOR-CON M) 20 MEQ tablet Take 20 mEq by mouth 2 (two) times daily as needed (when she takes super water pill).     revefenacin (YUPELRI) 175 MCG/3ML nebulizer solution Take 175 mcg by nebulization daily.     rivaroxaban  (XARELTO) 20 MG TABS tablet Take 1 tablet (20 mg total) by mouth daily with supper. 30 tablet 5   torsemide (DEMADEX) 20 MG tablet Take 1 tablet (20 mg total) by mouth daily. 90 tablet 2   No current facility-administered medications for this visit.    Allergies Allergies  Allergen Reactions   Allopurinol Other (See Comments)    Reaction:  Dizziness    Clindamycin Anaphylaxis and Hives   Flublok [Influenza Vaccine Recombinant] Other (See Comments)    Fever 103 with no alternative explanation day after vaccine.  Clydie Braun Highfill FNP-C   Pneumococcal 13-Val Conj Vacc Itching, Swelling and Rash   Dronedarone Rash   Montelukast Other (See Comments)    Makes her loopy.   Brovana [Arformoterol]     Caused muscle pain   Budesonide     Caused extreme joint pain   Entresto [Sacubitril-Valsartan] Other (See Comments)    hypotension   Fosamax [Alendronate Sodium] Nausea Only   Jardiance [Empagliflozin]  Caused a vaginal infection   Meperidine Nausea And Vomiting   Microplegia Msa-Msg [Plegisol]     Loopy,diarrhea   Rosuvastatin Other (See Comments)    Reaction:  Muscle spasms    Tetracycline Hives   Adhesive [Tape] Rash   Lovastatin Rash and Other (See Comments)    Muscle Pain    Histories 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    Concussion 10/04/2021   COPD (chronic obstructive pulmonary disease) (HCC)    Diverticulitis    Gout    Hypertension    Longstanding persistent atrial fibrillation (HCC)    on Xarelto   Nonischemic cardiomyopathy (HCC)    Obesity    Pacemaker    Spontaneous pneumothorax 2013   Past Surgical History:  Procedure Laterality Date   ABDOMINAL HYSTERECTOMY     APPENDECTOMY     ATRIAL FIBRILLATION ABLATION  07/20/2013   by Dr Christin Fudge   AV nodal  ablation  11/01/2013   by Dr Christin Fudge, repeated by Dr Wilford Grist   BREAST BIOPSY Bilateral 1997   negative   CARDIAC CATHETERIZATION     CHOLECYSTECTOMY     HERNIA REPAIR     PACEMAKER INSERTION  06/2017   MDT Viva CRT-P implanted by Dr Christin Fudge after AV nodal ablation,  LV lead could not be placed   PACEMAKER INSERTION  05/2020   with lead bundle   RIGHT/LEFT HEART CATH AND CORONARY ANGIOGRAPHY N/A 03/20/2022   Procedure: RIGHT/LEFT HEART CATH AND CORONARY ANGIOGRAPHY;  Surgeon: [place], Peter M, MD;  Location: [facility] INVASIVE CV LAB;  Service: Cardiovascular;  Laterality: N/A;   Social History   Socioeconomic History   Marital status: Widowed    Spouse name: Not on file   Number of children: 1   Years of education: Not on file   Highest education level: Not on file  Occupational History   Occupation: retired  Tobacco Use   Smoking status: Former    Current packs/day: 0.00    Average packs/day: 1 pack/day for 20.0 years (20.0 ttl pk-yrs)    Types: Cigarettes    Start date: 06/09/1967    Quit date: 06/09/1987    Years since quitting: 35.7    Passive exposure: Past   Smokeless tobacco: Never   Tobacco comments:    Former smoker 12/25/21  Vaping Use   Vaping status: Never Used  Substance and Sexual Activity   Alcohol use: No    Alcohol/week: 0.0 standard drinks of alcohol   Drug use: No   Sexual activity: Not Currently  Other Topics Concern   Not on file  Social History Narrative   Pt lives in [place] alone.  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  Her son works in [employer].   Social Determinants of Health   Financial Resource Strain: Low Risk  (05/21/2022)   Overall Financial Resource Strain (CARDIA)    Difficulty of Paying Living Expenses: Not very hard  Food Insecurity: No Food Insecurity (01/20/2023)   Hunger Vital Sign    Worried About Running Out of Food in the Last Year: Never true    Ran Out of Food in the Last Year: Never true  Transportation Needs: No  Transportation Needs (01/20/2023)   PRAPARE - Transportation    Lack of  Transportation (Medical): No    Lack of Transportation (Non-Medical): No  Physical Activity: Insufficiently Active (05/21/2022)   Exercise Vital Sign    Days of Exercise per Week: 2 days    Minutes of Exercise per Session: 20 min  Stress: No Stress Concern Present (05/21/2022)   [employer] of Occupational Health - Occupational Stress Questionnaire    Feeling of Stress : Only a little  Social Connections: Moderately Isolated (05/21/2022)   Social Connection and Isolation Panel [NHANES]    Frequency of Communication with Friends and Family: More than three times a week    Frequency of Social Gatherings with Friends and Family: More than three times a week    Attends Religious Services: Never    Database administrator or Organizations: Yes    Attends Engineer, structural: More than 4 times per year    Marital Status: Widowed  Intimate Partner Violence: Not At Risk (01/14/2023)   Humiliation, Afraid, Rape, and Kick questionnaire    Fear of Current or Ex-Partner: No    Emotionally Abused: No    Physically Abused: No    Sexually Abused: No   Family History  Problem Relation Age of Onset   Breast cancer Mother 66       3 different times   Pancreatic cancer Mother    Emphysema Father    Healthy Brother    Breast cancer Cousin    Valvular heart disease Son    Obesity Daughter    Colon cancer Neg Hx    Esophageal cancer Neg Hx    Stomach cancer Neg Hx    Inflammatory bowel disease Neg Hx    Liver disease Neg Hx    Rectal cancer Neg Hx    I have reviewed her medical, social, and family history in detail and updated the electronic medical record as necessary.    PHYSICAL EXAMINATION  BP 122/72   Pulse 81   Ht 5' (1.524 m)   Wt 191 lb 4 oz (86.8 kg)   SpO2 96%   BMI 37.35 kg/m  Wt Readings from Last 3 Encounters:  03/17/23 191 lb 4 oz (86.8 kg)  03/05/23 191 lb 6.4 oz (86.8 kg)  02/12/23  191 lb 9.6 oz (86.9 kg)  GEN: NAD, appears older than stated age, appears chronically ill but is nontoxic PSYCH: Cooperative, without pressured speech EYE: Conjunctivae pale-pink ENT: MMM, without oral ulcers, no thrush CV: Nontachycardic RESP: Audible wheezing present GI: NABS, soft, rounded, obese, TTP in LLQ without rebound or guarding today  MSK/EXT: Bilateral lower extremity edema present SKIN: No jaundice NEURO:  Alert & Oriented x 3, no focal deficits   REVIEW OF DATA  I reviewed the following data at the time of this encounter:  GI Procedures and Studies  No new procedures to review  Laboratory Studies  Reviewed those in epic and care everywhere  Imaging Studies  We reviewed previous CT colonoscopy from 2021 ([facility]) Findings:  The prep is adequate, with minimal residual fluid and stool in the colon.  There is adequate gaseous distention of the colon. No masses are identified within the colon. In the mid transverse colon, there is a 10 mm polyp (series 3, image 233). In the distal transverse colon there is an additional 9 mm polyp (series 3, image 153).   Evaluation of the soft tissues of the abdomen and pelvis is limited due to the lack of contrast material and the low radiation dose protocol.    Incidental  findings include AICD leads within the right ventricular apex.  Multiple hypoattenuating liver lesions are unchanged. Exophytic left renal cyst.  Impression: Two transverse colon polyps measuring up to 10 mm.   February 2023 barium esophagram IMPRESSION: No definite abnormality seen in the esophagus.  September 2024 CT abdomen pelvis with contrast IMPRESSION: 1. Acute proximal sigmoid diverticulitis. No walled off abscess or loculated collection. No pneumoperitoneum. Follow-up colonoscopy is recommended after the acute episode subsides to exclude underlying neoplastic process. 2. Loss of fat planes between the vaginal vault and affected proximal sigmoid colon  with an apparent hyperattenuating walled tract, favored to represent colovaginal fistula. 3. There is a sub 6 mm (mean diameter), pleural-based right lung lower lobe solid pulmonary nodule. Please see follow-up recommendations below. Aortic Atherosclerosis (ICD10-I70.0). No follow-up needed if patient is low-risk (and has no known or suspected primary neoplasm). Non-contrast chest CT can be considered in 12 months if patient is high-risk. This recommendation follows the consensus statement: Guidelines for Management of Incidental Pulmonary Nodules Detected on CT Images: From the Fleischner Society 2017; Radiology 2017; 284:228-243.   ASSESSMENT  Ms. Renda is a 77 y.o. 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 fib (status post prior AVN ablation and PPM on anticoagulation), hypertension, gout, CRI, iron deficiency, family history of pancreas cancer (mother), colon polyps (found on CT colonography in 2021), diverticulitis (complicated with colovaginal fistula).  The patient is seen today for evaluation and management of:  1. Diverticulitis   2. Colovaginal fistula   3. Other iron deficiency anemia   4. Chronic nausea   5. LLQ pain   6. Dysuria   7. Chronic respiratory failure with hypoxia (HCC)   8. Chronic anticoagulation    The patient is hemodynamically stable.  Clinically, she seems to be in a very difficult situation with this recent bout of presumed diverticulitis with complication of colovaginal fistula.  She continues to have stools per vagina.  As she is having some recurrence of the left lower quadrant discomfort that was occurring before her hospitalization, I want to make sure that nothing has developed such as a new abscess or phlegmon.  Will proceed with a CT abdomen/pelvis to evaluate for that.  As amount of stool that is passing through her vagina, I suspect that this defect is large,  not sure barium enema will be needed at this point, but passing through this area to ensure that this is just diverticulitis related versus the possibility of being mass/lesion related complication/fistulization is important.  Will see what the updated CT scan shows.  She will continue her stool softeners.  We can trial her on Trulance rather than Linzess but can trial her back on that if needed.  Will give her samples and she will let us know how she is doing.  She should continue a low residue soft diet for now until we see follow-up CT scan.  Now that she has evidence of persisting/ongoing iron deficiency, it is important for [place] to understand her risks from an anesthesia perspective and cardiovascular standpoint but endoscopic evaluation is critical to further evaluate her symptoms and make sure that we are not missing a masslike lesion that our colorectal surgeons will need more information about before undergoing high risk surgery.  I will obtain a urinalysis and urine culture today with her urinary discomfort/dysuria.  Based on her laboratories, will consider additional management and intravenous iron infusions if needed.  I will hold on antibiotics for now until the CT scan returns.  We will obtain pulmonary and cardiology optimization before colonoscopy/EGD in the hospital-based setting.  We will also hold her blood thinner for 2 days prior to her procedure.  The risks and benefits of endoscopic evaluation were discussed with the patient; these include but are not limited to the risk of perforation, infection, bleeding, missed lesions, lack of diagnosis, severe illness requiring hospitalization, as well as anesthesia and sedation related illnesses.  The patient and/or family is agreeable to proceed.  All patient questions were answered to the best of my ability, and the patient agrees to the aforementioned plan of action with follow-up as indicated.   PLAN  Laboratories as outlined below UA with urine  culture reflex Proceed with scheduling CT abdomen pelvis with contrast Proceed with scheduling colonoscopy/EGD for further evaluation of IDA and recent diverticulitis (presumed versus other reason for complicated fistulization) Appreciate pulmonary and cardiology optimization prior to endoscopic evaluation and also for our surgeons as they move forward with potential interventions in the near future    Orders Placed This Encounter  Procedures   Procedural/ Surgical Case Request: COLONOSCOPY WITH PROPOFOL, ESOPHAGOGASTRODUODENOSCOPY (EGD) WITH PROPOFOL   CT ABDOMEN PELVIS W CONTRAST   CBC   Basic Metabolic Panel (BMET)   Sedimentation rate   C-reactive protein   Urinalysis with Culture, if indicated   Ambulatory referral to Gastroenterology    New Prescriptions   NA SULFATE-K SULFATE-MG SULF (SUPREP BOWEL PREP KIT) 17.5-3.13-1.6 GM/177ML SOLN    Take 1 kit by mouth as directed. For colonoscopy prep   Modified Medications   No medications on file    Planned Follow Up No follow-ups on file.   Total Time in Face-to-Face and in Coordination of Care for patient including independent/personal interpretation/review of prior testing, medical history, examination, medication adjustment, communicating results with the patient directly, and documentation within the EHR is 25 minutes.   Corliss Parish, MD [facility]oscopy Office # [phone]

## 2023-03-17 NOTE — Progress Notes (Signed)
Spoke with patient and provided recommendations given by Tonye Becket, NP.  Advised to increase Torsemide to 40 mg daily.  Also take Potassium 40 mEq by mouth daily.  She should also take an additional 2 Potassium tablets (40 mEq total) when she takes Metolazone (she calls super pill).  Advised will need lab drawn in a week on 8/16 and she requested to have the labs drawn at [facility] since she already is schedule for a test that day.   Escripts updated and sent to preferred pharmacy of [facility].  She verbalized understanding and does not need any prescription refills at this time.

## 2023-03-18 ENCOUNTER — Encounter: Payer: Self-pay | Admitting: Gastroenterology

## 2023-03-18 ENCOUNTER — Encounter: Payer: Self-pay | Admitting: Licensed Clinical Social Worker

## 2023-03-18 ENCOUNTER — Ambulatory Visit: Payer: Self-pay

## 2023-03-18 LAB — URINE CULTURE
MICRO NUMBER:: 15572704
SPECIMEN QUALITY:: ADEQUATE

## 2023-03-18 NOTE — Patient Outreach (Signed)
Care Coordination   Follow Up Visit Note   03/18/2023 Name: Kathleen [name] MRN: [id number] DOB: December 24, 1945  Kathleen [name] is a 77 y.o. year old female who sees [name], MD for primary care. I spoke with  [name] by phone today.  What matters to the patients health and wellness today?  Recent hospital visit 02/22/23-02/25/23 with diverticulitis and Colovaginal Fistula. Patient reports currently being evaluated further for this-CT aBd pelvis and colonoscopy planned per GI. Continues to follow up with advanced HF clinic, pulmonology and afib clinic as recommended. Active with ICM monitoring. Patient questions if pallliiative care continues to be involved. RNCM provided contact number for main number for [facility] Palliative care. Continues to be active with LCSW for community resources/coping skills.   Goals Addressed             This Visit's Progress    COMPLETED: Assist with health management post hospitalization       Interventions Today    Flowsheet Row Most Recent Value  Chronic Disease   Chronic disease during today's visit Chronic Obstructive Pulmonary Disease (COPD), Congestive Heart Failure (CHF), Hypertension (HTN)  General Interventions   General Interventions Discussed/Reviewed General Interventions Reviewed, Durable Medical Equipment (DME)  Doctor Visits Discussed/Reviewed Doctor Visits Discussed  Durable Medical Equipment (DME) Other, Oxygen  [BIPAP]  PCP/Specialist Visits Compliance with follow-up visit  [reviewed upcoming follow up appointments]  Exercise Interventions   Exercise Discussed/Reviewed Exercise Reviewed  Education Interventions   Education Provided Provided Education  [reviewed patient instructions per provider office visit 02/04/23 HF clinic and 02/12/23 pulmnonary office visit Patient reports has communicated with providers and understands as instructed by provider]  Provided Verbal Education On Medication, Exercise, Other  [discussed  signs/symptoms of HF exacerbation and when to call the doctor]  Mental Health Interventions   Mental Health Discussed/Reviewed Refer to Social Work for resources  [other resources for independent living]  Refer to Social Work for resources regarding --  [independent living resources]  Nutrition Interventions   Nutrition Discussed/Reviewed Nutrition Reviewed  [discussed low salt diet, encouraged to monitor fluid intake-recommendation <2L/day or per Provider recommendations, discussed importance of being as active as possible]  Pharmacy Interventions   Pharmacy Dicussed/Reviewed Pharmacy Topics Reviewed  Advanced Directive Interventions   End of Life Palliative  [confirms active with palliative care]           Asssit with health management       Interventions Today    Flowsheet Row Most Recent Value  Chronic Disease   Chronic disease during today's visit Other, Congestive Heart Failure (CHF), Atrial Fibrillation (AFib), Chronic Obstructive Pulmonary Disease (COPD)  General Interventions   General Interventions Discussed/Reviewed General Interventions Reviewed, Doctor Visits  [Evaluation of current treatment plan for health condition and patient's adherence to plan.]  Doctor Visits Discussed/Reviewed Doctor Visits Reviewed, Doctor Visits Discussed  PCP/Specialist Visits Compliance with follow-up visit  [reviewed upcoming scheduled visits]  Education Interventions   Education Provided Provided Education  Provided Verbal Education On Other, Medication, Nutrition, When to see the doctor  [discussed soft diet/nutrition,  purpose of CT abd/pelvis. advixsd patient to discuss concerns : h/o constipation and concern of affect oral contrast may have on constipation.]  Mental Health Interventions   Mental Health Discussed/Reviewed Coping Strategies  [active listening and support]  Nutrition Interventions   Nutrition Discussed/Reviewed Nutrition Reviewed  Pharmacy Interventions   Pharmacy  Dicussed/Reviewed Pharmacy Topics Reviewed            SDOH  assessments and interventions completed:  No  Care Coordination Interventions:  Yes, provided   Follow up plan: Follow up call scheduled for 04/14/23    Encounter Outcome:  Patient Visit Completed   Kathyrn Sheriff, RN, MSN, BSN, CCM Care Management Coordinator [phone]

## 2023-03-19 ENCOUNTER — Other Ambulatory Visit: Payer: Self-pay

## 2023-03-19 ENCOUNTER — Ambulatory Visit: Payer: Self-pay | Admitting: Licensed Clinical Social Worker

## 2023-03-19 MED ORDER — POTASSIUM CHLORIDE CRYS ER 20 MEQ PO TBCR: 40.0000 meq | EXTENDED_RELEASE_TABLET | Freq: Once | ORAL | 0 refills | Status: DC

## 2023-03-19 NOTE — Patient Outreach (Signed)
Care Coordination   Collaboration  Visit Note   03/18/2023 Name: Kathleen [name] MRN: [id number] DOB: 08-Feb-1946  Kathleen [name] is a 77 y.o. year old female who sees [name], MD for primary care. I  engaged with the BSW  What matters to the patients health and wellness today?  Patient was not engaged during this encounter  SDOH assessments and interventions completed:  No     Care Coordination Interventions:  Yes, provided  Interventions Today    Flowsheet Row Most Recent Value  Chronic Disease   Chronic disease during today's visit Congestive Heart Failure (CHF), Chronic Obstructive Pulmonary Disease (COPD)  General Interventions   General Interventions Discussed/Reviewed Communication with  Communication with Social Work  [employer] collaborated with [employer] regarding patient care needs, barriers, and supportive resources]       Follow up plan:  LCSW will continue to provide ongoing support    Encounter Outcome:  Patient Visit Completed   Jenel Lucks, MSW, LCSW [facility] Care Management [place]  [facility] [email or web address] Phone [phone] 6:27 AM

## 2023-03-19 NOTE — Progress Notes (Addendum)
Returned call to patient as requested by voice mail message.   Clarified she should continue to take Torsemide 40 mg daily and Potassium 40 mEq daily as recommended by HF clinic as a long term dosage unless she receives call from  HF clinic to change it.  She verbalized understanding.

## 2023-03-19 NOTE — Telephone Encounter (Signed)
Dr Meridee Score please see the question from the pt regarding anesthesia.

## 2023-03-22 ENCOUNTER — Telehealth: Payer: Self-pay

## 2023-03-22 ENCOUNTER — Ambulatory Visit
Admission: RE | Admit: 2023-03-22 | Discharge: 2023-03-22 | Disposition: A | Discharge status: Home / Self Care | Payer: Medicare Other | Source: Ambulatory Visit | Attending: Cardiology | Admitting: Cardiology

## 2023-03-22 ENCOUNTER — Ambulatory Visit (INDEPENDENT_AMBULATORY_CARE_PROVIDER_SITE_OTHER): Payer: Medicare Other

## 2023-03-22 ENCOUNTER — Encounter: Payer: Self-pay | Admitting: Internal Medicine

## 2023-03-22 DIAGNOSIS — I5022 Chronic systolic (congestive) heart failure: Secondary | ICD-10-CM | POA: Diagnosis present

## 2023-03-22 DIAGNOSIS — Z950 Presence of cardiac pacemaker: Secondary | ICD-10-CM | POA: Diagnosis not present

## 2023-03-22 LAB — BASIC METABOLIC PANEL
Anion gap: 9 (ref 5–15)
BUN: 7 mg/dL — ABNORMAL LOW (ref 8–23)
CO2: 24 mmol/L (ref 22–32)
Calcium: 8.6 mg/dL — ABNORMAL LOW (ref 8.9–10.3)
Chloride: 105 mmol/L (ref 98–111)
Creatinine, Ser: 0.71 mg/dL (ref 0.44–1.00)
GFR, Estimated: >60 mL/min (ref >60)
Glucose, Bld: 145 mg/dL — ABNORMAL HIGH (ref 70–99)
Potassium: 4 mmol/L (ref 3.5–5.1)
Sodium: 138 mmol/L (ref 135–145)

## 2023-03-22 NOTE — Patient Instructions (Signed)
Visit Information  Thank you for taking time to visit with me today. Please don't hesitate to contact me if I can be of assistance to you.   Following are the goals we discussed today:   Goals Addressed             This Visit's Progress    Obtain Supportive Resources-Housing   On track    Activities and task to complete in order to accomplish goals.   Keep all upcoming appointments discussed today Continue with compliance of taking medication prescribed by Doctor Implement healthy coping skills discussed to assist with management of symptoms         Our next appointment is by telephone on 10/25 at 10:30 AM  Please call the care guide team at [phone] if you need to cancel or reschedule your appointment.   If you are experiencing a Mental Health or Behavioral Health Crisis or need someone to talk to, please call the Suicide and Crisis Lifeline: 988 call 911   Patient verbalizes understanding of instructions and care plan provided today and agrees to view in MyChart. Active MyChart status and patient understanding of how to access instructions and care plan via MyChart confirmed with patient.     Jenel Lucks, MSW, LCSW [facility] Care Management [place]  [facility] [email or web address] Phone [phone] 10:50 AM

## 2023-03-22 NOTE — Telephone Encounter (Signed)
Request for surgical clearance:     Endoscopy Procedure  What type of surgery is being performed?    Colon /EGD   When is this surgery scheduled?     05/03/23  What type of clearance is required ?   Pharmacy  Are there any medications that need to be held prior to surgery and how long? Xarelto x2 days prior to procedure   Practice name and name of physician performing surgery?       Gastroenterology-Dr Corliss Parish   What is your office phone and fax number?      Phone- [phone]  Fax- [phone]  Anesthesia type (None, local, MAC, general) ?       MAC

## 2023-03-22 NOTE — Patient Outreach (Signed)
Care Coordination   Follow Up Visit Note   03/19/2023 Name: Kathleen [name] MRN: [id number] DOB: September 30, 1945  Kathleen [name] is a 77 y.o. year old female who sees [name], MD for primary care. I spoke with  [name] by phone today.  What matters to the patients health and wellness today?  Housing    Goals Addressed             This Visit's Progress    Obtain Supportive Resources-Housing   On track    Activities and task to complete in order to accomplish goals.   Keep all upcoming appointments discussed today Continue with compliance of taking medication prescribed by Doctor Implement healthy coping skills discussed to assist with management of symptoms         SDOH assessments and interventions completed:  No     Care Coordination Interventions:  Yes, provided  Interventions Today    Flowsheet Row Most Recent Value  Chronic Disease   Chronic disease during today's visit Congestive Heart Failure (CHF), Atrial Fibrillation (AFib), Chronic Obstructive Pulmonary Disease (COPD), Chronic Kidney Disease/End Stage Renal Disease (ESRD), Other  [Depression]  General Interventions   General Interventions Discussed/Reviewed General Interventions Reviewed, [employer], Doctor Visits  [Patient has stable housing,  however, is interested in moving by the new year. LCSW discussed various type of senior housing noting pt is interested in purchasing/renting a single family home.]  Doctor Visits Discussed/Reviewed Doctor Visits Reviewed  Communication with Social Work  [employer] collaborated with BSW regarding housing]  Mental Health Interventions   Mental Health Discussed/Reviewed Mental Health Reviewed, Coping Strategies, Anxiety, Depression  Nutrition Interventions   Nutrition Discussed/Reviewed Nutrition Reviewed  Pharmacy Interventions   Pharmacy Dicussed/Reviewed Pharmacy Topics Reviewed, Medication Adherence       Follow up plan: Follow up call scheduled  for 2-4 weeks    Encounter Outcome:  Patient Visit Completed   Jenel Lucks, MSW, LCSW [facility] Care Management [facility]  [facility] [email or web address] Phone [phone] 10:50 AM

## 2023-03-23 ENCOUNTER — Other Ambulatory Visit: Payer: Self-pay | Admitting: Nurse Practitioner

## 2023-03-23 DIAGNOSIS — J4489 Other specified chronic obstructive pulmonary disease: Secondary | ICD-10-CM

## 2023-03-23 NOTE — Telephone Encounter (Signed)
Patient is followed by [facility]. Routing message for recommendations regarding upcoming colonoscopy.  Levi Aland, NP-C  03/23/2023, 12:40 PM [address] Office [phone] Fax [phone]

## 2023-03-23 NOTE — Telephone Encounter (Signed)
Patient with diagnosis of A Fib on Xarelto for anticoagulation.    Procedure: Colon /EGD  Date of procedure: 05/03/23   CHA2DS2-VASc Score = 7  This indicates a 11.2% annual risk of stroke. The patient's score is based upon: CHF History: 1 HTN History: 1 Diabetes History: 1 Stroke History: 0 Vascular Disease History: 1 Age Score: 2 Gender Score: 1    CrCl 101 mL/min Platelet count 295K  Per office protocol, patient can hold Xarelto for 1 day prior to procedure.    **This guidance is not considered finalized until pre-operative APP has relayed final recommendations.**

## 2023-03-23 NOTE — Progress Notes (Unsigned)
EPIC Encounter for ICM Monitoring  Patient Name: Kathleen [name] is a 77 y.o. female Date: 03/23/2023 Primary Care Physican: [name], MD Primary Cardiologist: Bensimhon Electrophysiologist: Camnitz Bi-V Pacing: 100%          07/28/2022 Weight: 184.6 lbs 09/07/2022   Weight: 187 lbs 11/17/2022 Weight: 190-191 lbs    02/04/2023 Office: 194 lbs     03/16/2023 Weight: 190 lbs       03/23/2023 Weight: 188 lbs                                       Spoke with patient and heart failure questions reviewed.  Transmission results reviewed.  Pt reports breathing has improved but does have SOB when climbing steps but abdomen is swollen and hard for past 4 days.  She is unsure if it related to her GI issues.  She will have CT scan tomorrow and a colonoscopy in November.     Diet:  She lives in an assisted living  Eats foods that are prepared at the facility she lives in which are not low salt.     Optivol thoracic impedance suggesting fluid levels improved after Torsemide dosage increased to 40 mg daily but continues to show ongoing fluid accumlation.  Fluid index greater than normal threshold starting 10/3.   Prescribed:  Torsemide 20 mg take 2 tablet(s) (40 mg total) by mouth daily.  (increased 10/9) Potassium 20 mEq take 2 tablets (40 mEq total) by mouth daily and take additional 2 tablets (40 mEq total) when taking Metolazone (ordered 10/9).  Potassium prescription changed 10/11 by Dr Meridee Score to 2 tablets for 1 time dose Metolazone 2.5 mg take 1 tablet by mouth as needed (for swelling or SOB).  Must take 40 mEq of Potassium with this.    Labs: 03/22/2023 Creatinine 0.71, BUN 7,   Potassium 4.0, Sodium 138, GFR >60 03/17/2023 Creatinine 0.65, BUN 9,   Potassium 3.3, Sodium 141 03/05/2023 Creatinine 1.14, BUN 44, Potassium 3.1, Sodium 131, GFR 50 02/25/2023 Creatinine 0.91, BUN 11, Potassium 3.7, Sodium 135, GFR >60  02/24/2023 Creatinine 0.83, BUN   7, Potassium 3.7, Sodium 135, GFR >60   02/23/2023 Creatinine 0.90, BUN 15, Potassium 4.1, Sodium 132, GFR >60  02/22/2023 Creatinine 0.91, BUN 17, Potassium 3.1, Sodium 136, GFR >60 01/27/2023 Creatinine 0.86, BUN 32, Potassium 4.2, Sodium 139 01/16/2023 Creatinine 0.91, BUN 40, Potassium 4.0, Sodium 137, GFR >60  01/14/2023 Creatinine 0.73, BUN 19, Potassium 4.0, Sodium 137, GFR >60  01/13/2023 Creatinine 0.68, BUN 22, Potassium 4.5, Sodium 139, GFR >60  09/15/2022 Creatinine 0.84, BUN 19, Potassium 4.3, Sodium 139, GFR >60 A complete set of results can be found in Results Review.   Recommendations:  Copy sent to Tonye Becket, NP for review and recommendations if needed.  Amy, Dr Meridee Score discontinued the Potassium prescription that you ordered on 10/9.  Do you want me to change it back to the 40 mEq daily and additional 40 mEq with Metolazone.  She has not taken any Metolazone since 9/25.  Is she eligible for Paramedicine program?   Follow-up plan: ICM clinic phone appointment on 04/05/2023 for 31 day & to recheck fluid levels (fluid levels will be rechecked at 10/24 HF clinic OV).  91 day device clinic remote transmission 03/31/2023.     EP/Cardiology Office Visits:   04/01/2023 with HF clinic.   Recall 07/14/2023 with Dr Gala Romney.  Recall 05/13/2023 with Francis Dowse, PA or Otilio Saber, [place].     Copy of ICM check sent to Dr. Elberta Fortis.   3 month ICM trend: 03/23/2023.    12-14 Month ICM trend:     Karie Soda, RN 03/23/2023 8:08 AM

## 2023-03-24 ENCOUNTER — Telehealth (HOSPITAL_COMMUNITY): Payer: Self-pay | Admitting: Licensed Clinical Social Worker

## 2023-03-24 ENCOUNTER — Ambulatory Visit (HOSPITAL_COMMUNITY)
Admission: RE | Admit: 2023-03-24 | Discharge: 2023-03-24 | Disposition: A | Discharge status: Home / Self Care | Payer: Medicare Other | Source: Ambulatory Visit | Attending: Gastroenterology | Admitting: Gastroenterology

## 2023-03-24 DIAGNOSIS — R300 Dysuria: Secondary | ICD-10-CM | POA: Insufficient documentation

## 2023-03-24 DIAGNOSIS — J441 Chronic obstructive pulmonary disease with (acute) exacerbation: Secondary | ICD-10-CM | POA: Diagnosis not present

## 2023-03-24 DIAGNOSIS — R1032 Left lower quadrant pain: Secondary | ICD-10-CM | POA: Insufficient documentation

## 2023-03-24 DIAGNOSIS — N824 Other female intestinal-genital tract fistulae: Secondary | ICD-10-CM | POA: Insufficient documentation

## 2023-03-24 DIAGNOSIS — K5792 Diverticulitis of intestine, part unspecified, without perforation or abscess without bleeding: Secondary | ICD-10-CM | POA: Insufficient documentation

## 2023-03-24 DIAGNOSIS — D508 Other iron deficiency anemias: Secondary | ICD-10-CM | POA: Insufficient documentation

## 2023-03-24 DIAGNOSIS — A419 Sepsis, unspecified organism: Secondary | ICD-10-CM | POA: Diagnosis not present

## 2023-03-24 MED ORDER — METOLAZONE 2.5 MG PO TABS: 2.5000 mg | ORAL_TABLET | ORAL | 0 refills | Status: DC

## 2023-03-24 MED ORDER — IOHEXOL 9 MG/ML PO SOLN
1000.0000 mL | Freq: Once | ORAL | Status: AC
  Administered 2023-03-24: 1000 mL via ORAL

## 2023-03-24 MED ORDER — IOHEXOL 300 MG/ML  SOLN
100.0000 mL | Freq: Once | INTRAMUSCULAR | Status: AC | PRN
  Administered 2023-03-24: 100 mL via INTRAVENOUS

## 2023-03-24 MED ORDER — TORSEMIDE 20 MG PO TABS: 60.0000 mg | ORAL_TABLET | Freq: Every day | ORAL | Status: DC

## 2023-03-24 MED ORDER — POTASSIUM CHLORIDE CRYS ER 20 MEQ PO TBCR: EXTENDED_RELEASE_TABLET | ORAL | Status: DC

## 2023-03-24 NOTE — Progress Notes (Signed)
  Received: Caryl Ada, Amy D, NP  Raychelle Hudman, Josephine Igo, RN Thank you!  Please call. Ask her increase torsemide to 60 mg daily and start weekly metolazone. Hold off on K and will need BMET next week.  Amy Clegg    Message sent to Amy to clarify if Potassium is needed for weekly Metolazone"  Received: Today Sherald Hess, NP  Keaundra Stehle, Josephine Igo, RN Please give with metolazone.  Thank you Amy CLegg NP-C

## 2023-03-24 NOTE — Progress Notes (Signed)
Spoke with patient and advised Tonye Becket, NP at HF clinic recommended she take Torsemide 20 mg 3 tablets (60 mg total) by mouth daily with out daily potassium.   She also advised to take weekly Metolazone with Potassium 20 mEq 2 tablets (40 mEq total) with weekly metolazone.  She will start weekly Metolazone tomorrow 10/17.  Advised to take Metolazone and Potassium 30 minutes prior to morning prescribed Torsemide dosage of 60 mg.   She verbalized understanding and repeated instructions back correctly.  Advised will need BMET in a week and that can be drawn at 10/24 OV.     Escripts update.   Discussed Paramedicine program and explained she can transfer her prescriptions to a [facility] located close to her home.  Explained benefits of using [facility] and advised to her to discuss at the HF clinic next week.  Amy Filbert Schilder will refer patient to paramed program.  She will think about using [facility].

## 2023-03-24 NOTE — Telephone Encounter (Signed)
CSW called pt to discuss referral to [employer]- unable to reach- left VM requesting return call  Burna Sis, LCSW Clinical Social Worker [facility] Desk#: [phone] Cell#: [phone]

## 2023-03-24 NOTE — Progress Notes (Signed)
Attempted call to patient and unable to reach.

## 2023-03-25 ENCOUNTER — Telehealth (HOSPITAL_COMMUNITY): Payer: Self-pay | Admitting: Licensed Clinical Social Worker

## 2023-03-25 NOTE — Progress Notes (Signed)
Heart and Vascular Care Navigation  03/25/2023  CEDRIA [name] 09/21/1945 [id number]  Reason for Referral: paramedicine   Engaged with patient by telephone for initial visit for Heart and Vascular Care Coordination.                                                                                                   Assessment:     Patient agreeable to enrollment in paramedicine program  Paramedicine Initial Assessment:  Housing:  In what kind of housing do you live? House/apt/trailer/shelter? [facility]  Do you live with anyone? no  Social:  What is your current marital status?  Do you have any children? Three children- [name] lives locally but daughter in [place] and other son is [place]  Food:  No concerns getting enough food- been cooking own food at this time due to new diet (soft for diverticulitis)- pay for one meal a day with facility.  Income:  What is your current source of income? Social Security- [id number] after deductions (medicare premiums)  Transportation:  Do you have transportation to your medical appointments? Just sold car- facility provides transportation when needed- son takes her when facility is not an option.   Daily Health Needs: Do you have a working scale at home? Yes   How do you manage your medications at home?fill up pill box- feel comfortable   Do you ever take your medications differently than prescribed? no  Do you have issues affording your medications? Xarelto is expensive- will inquire about assistance options  Do you have any concerns with mobility at home? Uses a rollator  Do you have a PCP? Sanda Linger  Do you currently use tobacco products or have recently quit? no  Do you currently see any mental health providers? no                                 HRT/VAS Care Coordination     Patients Home Cardiology Office Heart Failure Clinic   Outpatient Care Team [facility] Paramedicine   Living arrangements for the  past 2 months Apartment; Independent Living Facility   Lives with: Self   Patient Current Insurance Coverage Traditional Medicare  BCBS supplement   Does Patient Have Prescription Coverage? Yes  [employer]   Home Assistive Devices/Equipment Walker (specify type); Oxygen; Nebulizer; Scales; BIPAP; Blood pressure cuff; Shower chair with back  [employer] supplies, rollator walker; pulse oximeter   DME Agency NA   HH Agency [facility]   Current home services Home PT       Social History:                                                                             SDOH Screenings  Food Insecurity: No Food Insecurity (01/20/2023)  Housing: Low Risk  (01/14/2023)  Transportation Needs: No Transportation Needs (01/20/2023)  Utilities: Not At Risk (01/14/2023)  Alcohol Screen: Low Risk  (05/21/2022)  Depression (PHQ2-9): Low Risk  (05/21/2022)  Financial Resource Strain: Low Risk  (05/21/2022)  Physical Activity: Insufficiently Active (05/21/2022)  Social Connections: Moderately Isolated (05/21/2022)  Stress: No Stress Concern Present (05/21/2022)  Tobacco Use: Medium Risk (03/17/2023)    SDOH Interventions: Financial Resources:    Retirement income  Food Insecurity:  No concerns  Housing Insecurity:  None   Transportation:   No concerns    Follow-up plan:    CSW sending referral out to paramedics for assignment and follow up  Burna Sis, LCSW Clinical Social Worker [facility] Desk#: [phone] Cell#: [phone]

## 2023-03-26 ENCOUNTER — Other Ambulatory Visit: Payer: Self-pay

## 2023-03-26 ENCOUNTER — Telehealth: Payer: Self-pay | Admitting: *Deleted

## 2023-03-26 ENCOUNTER — Emergency Department (HOSPITAL_BASED_OUTPATIENT_CLINIC_OR_DEPARTMENT_OTHER): Payer: Medicare Other

## 2023-03-26 ENCOUNTER — Encounter (HOSPITAL_BASED_OUTPATIENT_CLINIC_OR_DEPARTMENT_OTHER): Payer: Self-pay | Admitting: Emergency Medicine

## 2023-03-26 ENCOUNTER — Emergency Department (HOSPITAL_BASED_OUTPATIENT_CLINIC_OR_DEPARTMENT_OTHER): Payer: Medicare Other | Admitting: Radiology

## 2023-03-26 ENCOUNTER — Inpatient Hospital Stay (HOSPITAL_BASED_OUTPATIENT_CLINIC_OR_DEPARTMENT_OTHER)
Admission: EM | Admit: 2023-03-26 | Discharge: 2023-04-04 | DRG: 871 | Disposition: A | Discharge status: Home Health | Payer: Medicare Other | Attending: Student | Admitting: Student

## 2023-03-26 DIAGNOSIS — E876 Hypokalemia: Secondary | ICD-10-CM | POA: Diagnosis present

## 2023-03-26 DIAGNOSIS — E8809 Other disorders of plasma-protein metabolism, not elsewhere classified: Secondary | ICD-10-CM | POA: Diagnosis present

## 2023-03-26 DIAGNOSIS — Z91048 Other nonmedicinal substance allergy status: Secondary | ICD-10-CM

## 2023-03-26 DIAGNOSIS — K5720 Diverticulitis of large intestine with perforation and abscess without bleeding: Secondary | ICD-10-CM | POA: Diagnosis not present

## 2023-03-26 DIAGNOSIS — J441 Chronic obstructive pulmonary disease with (acute) exacerbation: Principal | ICD-10-CM

## 2023-03-26 DIAGNOSIS — D6869 Other thrombophilia: Secondary | ICD-10-CM | POA: Diagnosis present

## 2023-03-26 DIAGNOSIS — E785 Hyperlipidemia, unspecified: Secondary | ICD-10-CM | POA: Diagnosis present

## 2023-03-26 DIAGNOSIS — E46 Unspecified protein-calorie malnutrition: Secondary | ICD-10-CM | POA: Diagnosis present

## 2023-03-26 DIAGNOSIS — R6520 Severe sepsis without septic shock: Secondary | ICD-10-CM | POA: Diagnosis present

## 2023-03-26 DIAGNOSIS — J189 Pneumonia, unspecified organism: Secondary | ICD-10-CM | POA: Diagnosis present

## 2023-03-26 DIAGNOSIS — E1122 Type 2 diabetes mellitus with diabetic chronic kidney disease: Secondary | ICD-10-CM | POA: Diagnosis present

## 2023-03-26 DIAGNOSIS — N823 Fistula of vagina to large intestine: Secondary | ICD-10-CM | POA: Diagnosis present

## 2023-03-26 DIAGNOSIS — K5733 Diverticulitis of large intestine without perforation or abscess with bleeding: Secondary | ICD-10-CM | POA: Diagnosis present

## 2023-03-26 DIAGNOSIS — Z90710 Acquired absence of both cervix and uterus: Secondary | ICD-10-CM

## 2023-03-26 DIAGNOSIS — M954 Acquired deformity of chest and rib: Secondary | ICD-10-CM | POA: Diagnosis present

## 2023-03-26 DIAGNOSIS — I495 Sick sinus syndrome: Secondary | ICD-10-CM | POA: Diagnosis present

## 2023-03-26 DIAGNOSIS — I4819 Other persistent atrial fibrillation: Secondary | ICD-10-CM | POA: Diagnosis not present

## 2023-03-26 DIAGNOSIS — I700 Atherosclerosis of aorta: Secondary | ICD-10-CM | POA: Diagnosis present

## 2023-03-26 DIAGNOSIS — E872 Acidosis, unspecified: Secondary | ICD-10-CM | POA: Diagnosis present

## 2023-03-26 DIAGNOSIS — Z950 Presence of cardiac pacemaker: Secondary | ICD-10-CM | POA: Diagnosis not present

## 2023-03-26 DIAGNOSIS — N1831 Chronic kidney disease, stage 3a: Secondary | ICD-10-CM | POA: Diagnosis present

## 2023-03-26 DIAGNOSIS — J9621 Acute and chronic respiratory failure with hypoxia: Secondary | ICD-10-CM | POA: Diagnosis present

## 2023-03-26 DIAGNOSIS — Z1152 Encounter for screening for COVID-19: Secondary | ICD-10-CM | POA: Diagnosis not present

## 2023-03-26 DIAGNOSIS — Z9981 Dependence on supplemental oxygen: Secondary | ICD-10-CM

## 2023-03-26 DIAGNOSIS — J440 Chronic obstructive pulmonary disease with acute lower respiratory infection: Secondary | ICD-10-CM | POA: Diagnosis present

## 2023-03-26 DIAGNOSIS — R61 Generalized hyperhidrosis: Secondary | ICD-10-CM | POA: Diagnosis present

## 2023-03-26 DIAGNOSIS — I4811 Longstanding persistent atrial fibrillation: Secondary | ICD-10-CM | POA: Diagnosis present

## 2023-03-26 DIAGNOSIS — Z825 Family history of asthma and other chronic lower respiratory diseases: Secondary | ICD-10-CM

## 2023-03-26 DIAGNOSIS — D631 Anemia in chronic kidney disease: Secondary | ICD-10-CM | POA: Diagnosis present

## 2023-03-26 DIAGNOSIS — R1310 Dysphagia, unspecified: Secondary | ICD-10-CM | POA: Diagnosis present

## 2023-03-26 DIAGNOSIS — I429 Cardiomyopathy, unspecified: Secondary | ICD-10-CM | POA: Diagnosis not present

## 2023-03-26 DIAGNOSIS — Z6839 Body mass index (BMI) 39.0-39.9, adult: Secondary | ICD-10-CM

## 2023-03-26 DIAGNOSIS — A419 Sepsis, unspecified organism: Principal | ICD-10-CM

## 2023-03-26 DIAGNOSIS — J9611 Chronic respiratory failure with hypoxia: Secondary | ICD-10-CM | POA: Diagnosis present

## 2023-03-26 DIAGNOSIS — J8489 Other specified interstitial pulmonary diseases: Secondary | ICD-10-CM | POA: Diagnosis present

## 2023-03-26 DIAGNOSIS — I130 Hypertensive heart and chronic kidney disease with heart failure and stage 1 through stage 4 chronic kidney disease, or unspecified chronic kidney disease: Secondary | ICD-10-CM | POA: Diagnosis present

## 2023-03-26 DIAGNOSIS — Z9049 Acquired absence of other specified parts of digestive tract: Secondary | ICD-10-CM

## 2023-03-26 DIAGNOSIS — N824 Other female intestinal-genital tract fistulae: Secondary | ICD-10-CM | POA: Diagnosis present

## 2023-03-26 DIAGNOSIS — I10 Essential (primary) hypertension: Secondary | ICD-10-CM | POA: Diagnosis present

## 2023-03-26 DIAGNOSIS — I428 Other cardiomyopathies: Secondary | ICD-10-CM | POA: Diagnosis present

## 2023-03-26 DIAGNOSIS — I5022 Chronic systolic (congestive) heart failure: Secondary | ICD-10-CM | POA: Diagnosis present

## 2023-03-26 DIAGNOSIS — G8929 Other chronic pain: Secondary | ICD-10-CM | POA: Diagnosis present

## 2023-03-26 DIAGNOSIS — R300 Dysuria: Secondary | ICD-10-CM | POA: Diagnosis not present

## 2023-03-26 DIAGNOSIS — E1140 Type 2 diabetes mellitus with diabetic neuropathy, unspecified: Secondary | ICD-10-CM

## 2023-03-26 DIAGNOSIS — E039 Hypothyroidism, unspecified: Secondary | ICD-10-CM | POA: Diagnosis present

## 2023-03-26 DIAGNOSIS — Z79899 Other long term (current) drug therapy: Secondary | ICD-10-CM

## 2023-03-26 DIAGNOSIS — J9809 Other diseases of bronchus, not elsewhere classified: Secondary | ICD-10-CM | POA: Diagnosis present

## 2023-03-26 DIAGNOSIS — R911 Solitary pulmonary nodule: Secondary | ICD-10-CM | POA: Diagnosis present

## 2023-03-26 DIAGNOSIS — Z887 Allergy status to serum and vaccine status: Secondary | ICD-10-CM

## 2023-03-26 DIAGNOSIS — Z7901 Long term (current) use of anticoagulants: Secondary | ICD-10-CM | POA: Diagnosis not present

## 2023-03-26 DIAGNOSIS — D6859 Other primary thrombophilia: Secondary | ICD-10-CM | POA: Diagnosis present

## 2023-03-26 DIAGNOSIS — E440 Moderate protein-calorie malnutrition: Secondary | ICD-10-CM | POA: Diagnosis not present

## 2023-03-26 DIAGNOSIS — J410 Simple chronic bronchitis: Secondary | ICD-10-CM | POA: Diagnosis not present

## 2023-03-26 DIAGNOSIS — Z888 Allergy status to other drugs, medicaments and biological substances status: Secondary | ICD-10-CM

## 2023-03-26 DIAGNOSIS — G4733 Obstructive sleep apnea (adult) (pediatric): Secondary | ICD-10-CM | POA: Diagnosis present

## 2023-03-26 DIAGNOSIS — I4891 Unspecified atrial fibrillation: Secondary | ICD-10-CM | POA: Diagnosis present

## 2023-03-26 DIAGNOSIS — R682 Dry mouth, unspecified: Secondary | ICD-10-CM | POA: Diagnosis not present

## 2023-03-26 DIAGNOSIS — Z881 Allergy status to other antibiotic agents status: Secondary | ICD-10-CM

## 2023-03-26 DIAGNOSIS — J9620 Acute and chronic respiratory failure, unspecified whether with hypoxia or hypercapnia: Secondary | ICD-10-CM | POA: Diagnosis not present

## 2023-03-26 DIAGNOSIS — I480 Paroxysmal atrial fibrillation: Secondary | ICD-10-CM | POA: Diagnosis not present

## 2023-03-26 DIAGNOSIS — I2510 Atherosclerotic heart disease of native coronary artery without angina pectoris: Secondary | ICD-10-CM | POA: Diagnosis present

## 2023-03-26 DIAGNOSIS — I5042 Chronic combined systolic (congestive) and diastolic (congestive) heart failure: Secondary | ICD-10-CM | POA: Diagnosis present

## 2023-03-26 DIAGNOSIS — Z87891 Personal history of nicotine dependence: Secondary | ICD-10-CM

## 2023-03-26 DIAGNOSIS — D509 Iron deficiency anemia, unspecified: Secondary | ICD-10-CM | POA: Diagnosis present

## 2023-03-26 DIAGNOSIS — M545 Low back pain, unspecified: Secondary | ICD-10-CM | POA: Diagnosis present

## 2023-03-26 LAB — CBC WITH DIFFERENTIAL/PLATELET
Abs Immature Granulocytes: 0.1 10*3/uL — ABNORMAL HIGH (ref 0.00–0.07)
Basophils Absolute: 0.1 10*3/uL (ref 0.0–0.1)
Basophils Relative: 1 %
Eosinophils Absolute: 0.1 10*3/uL (ref 0.0–0.5)
Eosinophils Relative: 1 %
HCT: 35 % — ABNORMAL LOW (ref 36.0–46.0)
Hemoglobin: 11.2 g/dL — ABNORMAL LOW (ref 12.0–15.0)
Immature Granulocytes: 1 %
Lymphocytes Relative: 9 %
Lymphs Abs: 1.4 10*3/uL (ref 0.7–4.0)
MCH: 26.4 pg (ref 26.0–34.0)
MCHC: 32 g/dL (ref 30.0–36.0)
MCV: 82.5 fL (ref 80.0–100.0)
Monocytes Absolute: 1 10*3/uL (ref 0.1–1.0)
Monocytes Relative: 6 %
Neutro Abs: 12.3 10*3/uL — ABNORMAL HIGH (ref 1.7–7.7)
Neutrophils Relative %: 82 %
Platelets: 234 10*3/uL (ref 150–400)
RBC: 4.24 MIL/uL (ref 3.87–5.11)
RDW: 14.5 % (ref 11.5–15.5)
WBC: 14.9 10*3/uL — ABNORMAL HIGH (ref 4.0–10.5)
nRBC: 0 % (ref 0.0–0.2)

## 2023-03-26 LAB — LACTIC ACID, PLASMA: Lactic Acid, Venous: 2.7 mmol/L (ref 0.5–1.9)

## 2023-03-26 LAB — BASIC METABOLIC PANEL
Anion gap: 15 (ref 5–15)
BUN: 19 mg/dL (ref 8–23)
CO2: 28 mmol/L (ref 22–32)
Calcium: 8.8 mg/dL — ABNORMAL LOW (ref 8.9–10.3)
Chloride: 91 mmol/L — ABNORMAL LOW (ref 98–111)
Creatinine, Ser: 0.99 mg/dL (ref 0.44–1.00)
GFR, Estimated: 59 mL/min — ABNORMAL LOW (ref >60)
Glucose, Bld: 199 mg/dL — ABNORMAL HIGH (ref 70–99)
Potassium: 2.7 mmol/L — CL (ref 3.5–5.1)
Sodium: 134 mmol/L — ABNORMAL LOW (ref 135–145)

## 2023-03-26 LAB — RESP PANEL BY RT-PCR (RSV, FLU A&B, COVID)  RVPGX2
Influenza A by PCR: NEGATIVE
Influenza B by PCR: NEGATIVE
Resp Syncytial Virus by PCR: NEGATIVE
SARS Coronavirus 2 by RT PCR: NEGATIVE

## 2023-03-26 LAB — MAGNESIUM: Magnesium: 1.9 mg/dL (ref 1.7–2.4)

## 2023-03-26 LAB — BRAIN NATRIURETIC PEPTIDE: B Natriuretic Peptide: 48.5 pg/mL (ref 0.0–100.0)

## 2023-03-26 MED ORDER — ALBUTEROL SULFATE (2.5 MG/3ML) 0.083% IN NEBU
5.0000 mg | INHALATION_SOLUTION | Freq: Once | RESPIRATORY_TRACT | Status: AC
  Administered 2023-03-26: 5 mg via RESPIRATORY_TRACT

## 2023-03-26 MED ORDER — ALBUTEROL SULFATE (2.5 MG/3ML) 0.083% IN NEBU
2.5000 mg | INHALATION_SOLUTION | Freq: Once | RESPIRATORY_TRACT | Status: AC
  Administered 2023-03-26: 2.5 mg via RESPIRATORY_TRACT
  Filled 2023-03-26: qty 3

## 2023-03-26 MED ORDER — ACETAMINOPHEN 500 MG PO TABS
1000.0000 mg | ORAL_TABLET | Freq: Once | ORAL | Status: AC
  Administered 2023-03-26: 1000 mg via ORAL
  Filled 2023-03-26: qty 2

## 2023-03-26 MED ORDER — POTASSIUM CHLORIDE CRYS ER 20 MEQ PO TBCR
40.0000 meq | EXTENDED_RELEASE_TABLET | Freq: Once | ORAL | Status: AC
  Administered 2023-03-26: 40 meq via ORAL
  Filled 2023-03-26: qty 2

## 2023-03-26 MED ORDER — METHYLPREDNISOLONE SODIUM SUCC 125 MG IJ SOLR
125.0000 mg | Freq: Once | INTRAMUSCULAR | Status: AC
  Administered 2023-03-26: 125 mg via INTRAVENOUS
  Filled 2023-03-26: qty 2

## 2023-03-26 MED ORDER — SODIUM CHLORIDE 0.9 % IV SOLN
1.0000 g | Freq: Once | INTRAVENOUS | Status: AC
  Administered 2023-03-26: 1 g via INTRAVENOUS
  Filled 2023-03-26: qty 10

## 2023-03-26 MED ORDER — IPRATROPIUM-ALBUTEROL 0.5-2.5 (3) MG/3ML IN SOLN
3.0000 mL | Freq: Once | RESPIRATORY_TRACT | Status: AC
  Administered 2023-03-26: 3 mL via RESPIRATORY_TRACT
  Filled 2023-03-26: qty 3

## 2023-03-26 MED ORDER — SODIUM CHLORIDE 0.9 % IV SOLN
100.0000 mg | Freq: Once | INTRAVENOUS | Status: AC
  Administered 2023-03-27: 100 mg via INTRAVENOUS
  Filled 2023-03-26 (×2): qty 100

## 2023-03-26 MED ORDER — SODIUM CHLORIDE 0.9 % IV SOLN
500.0000 mg | Freq: Once | INTRAVENOUS | Status: DC
  Filled 2023-03-26: qty 5

## 2023-03-26 MED ORDER — SODIUM CHLORIDE 0.9 % IV BOLUS: 1000.0000 mL | Freq: Once | INTRAVENOUS | Status: DC

## 2023-03-26 NOTE — ED Provider Notes (Signed)
57 old female history of asthma, COPD, CHF baseline for their ox requirement, hypertension presented for shortness of breath.  She is felt short of breath for last few days.  Feels like COPD and pneumonia.  On exam she is slightly increased work of breathing and bibasilar wheezing.  Suspect likely pneumonia versus COPD.

## 2023-03-26 NOTE — Progress Notes (Signed)
RT NOTE:  Pt came in complaints of cough, pt has expiratory wheeze throughout all lung fields, Harris, PA verbal order for 5mg  Albuterol treatment. Pt reports she takes Yupelri nebulizer treatment every day at home along with Breo inhaler but has not taken her Mikael Spray today yet. Pt reports she wears 5L [place] at home and is requiring her 5L baseline here with saturations of 97%. Pt given flutter valve along with education, pt able to demonstrate proper use to RT.

## 2023-03-26 NOTE — ED Notes (Signed)
RT at bedside to complete MSE

## 2023-03-26 NOTE — ED Provider Notes (Signed)
[place] EMERGENCY DEPARTMENT AT [facility] Provider Note   CSN: [id number] Arrival date & time: 03/26/23  1749     History  Chief Complaint  Patient presents with   Cough    Kathleen [name] is a 77 y.o. female with past medical history of hypertension, A-fib, chronic respiratory failure, congestive heart failure, COPD presenting to emergency room with 4 days of productive cough, cough is worsening, mild shortness of breath.  Patient reports having increased need for oxygen.  Patient reports she feels like she has a pneumonia.  Reports fever and chills at home. Denies any abdominal pain, increased lower extremity edema, headache, chest pain.   Cough      Home Medications Prior to Admission medications   Medication Sig Start Date End Date Taking? Authorizing Provider  acetaminophen (TYLENOL) 650 MG CR tablet Take 1,300 mg by mouth every 8 (eight) hours as needed for pain.    [provider]  azelastine (ASTELIN) 0.1 % nasal spray Place 2 sprays into both nostrils 2 (two) times daily. 11/05/22   Oretha Milch, MD  Cholecalciferol (VITAMIN D3) 50 MCG (2000 UT) TABS Take 2,000 Units by mouth at bedtime.    [provider]  cyanocobalamin (VITAMIN B12) 1000 MCG/ML injection Inject 1 mL (1,000 mcg total) into the muscle every 30 (thirty) days. 05/05/22   [name], MD  dextromethorphan (DELSYM) 30 MG/5ML liquid Take 10 mLs by mouth as needed for cough.    [provider]  docusate sodium (COLACE) 100 MG capsule Take 100 mg by mouth 2 (two) times daily.    [provider]  dofetilide (TIKOSYN) 250 MCG capsule Take 1 capsule (250 mcg total) by mouth in the morning and at bedtime. 12/01/22 12/01/23  Fenton, Clint R, PA  fluticasone furoate-vilanterol (BREO ELLIPTA) 100-25 MCG/ACT AEPB INHALE 1 PUFF INTO THE LUNGS DAILY 03/23/23   Oretha Milch, MD  gabapentin (NEURONTIN) 100 MG capsule Take 2 capsules (200 mg total) by mouth at bedtime.  11/03/22 06/01/23  Windell Norfolk, MD  HYDROcodone-acetaminophen (NORCO/VICODIN) 5-325 MG tablet Take 1 tablet by mouth every 6 (six) hours as needed for severe pain. 02/24/23   Jerald Kief, MD  levalbuterol Pauline Aus) 0.63 MG/3ML nebulizer solution Take 3 mLs (0.63 mg total) by nebulization every 6 (six) hours as needed for wheezing or shortness of breath. DX J44.89 J96.21 01/20/23   Oretha Milch, MD  losartan (COZAAR) 25 MG tablet Take 12.5 mg by mouth daily.    [provider]  Magnesium 500 MG TABS Take 500 mg by mouth at bedtime.    [provider]  metolazone (ZAROXOLYN) 2.5 MG tablet Take 1 tablet (2.5 mg total) by mouth once a week. MUST TAKE OF POTASSIUM WITH THIS 03/24/23 06/22/23  Clegg, Amy D, NP  Na Sulfate-K Sulfate-Mg Sulf (SUPREP BOWEL PREP KIT) 17.5-3.13-1.6 GM/177ML SOLN Take 1 kit by mouth as directed. For colonoscopy prep 03/17/23   Mansouraty, Netty Starring., MD  OXYGEN Inhale 5 L into the lungs continuous. Use with resmed ventilator    [provider]  potassium chloride SA (KLOR-CON M) 20 MEQ tablet Take 2 tablets (40 mEq total) with weekly Metolazone dosage. 03/24/23   Clegg, Amy D, NP  revefenacin (YUPELRI) 175 MCG/3ML nebulizer solution Take 175 mcg by nebulization daily.    [provider]  rivaroxaban (XARELTO) 20 MG TABS tablet Take 1 tablet (20 mg total) by mouth daily with supper. 02/01/23   Bensimhon, [name],  MD  torsemide (DEMADEX) 20 MG tablet Take 3 tablets (60 mg total) by mouth daily. 03/24/23 06/22/23  Tonye Becket D, NP      Allergies    Allopurinol, Clindamycin, Flublok [influenza vaccine recombinant], Pneumococcal 13-val conj vacc, Dronedarone, Montelukast, Brovana [arformoterol], Budesonide, Entresto [sacubitril-valsartan], Fosamax [alendronate sodium], Jardiance [empagliflozin], Meperidine, Microplegia msa-msg [plegisol], Rosuvastatin, Tetracycline, Adhesive [tape], and Lovastatin    Review of Systems   Review of  Systems  Respiratory:  Positive for cough.     Physical Exam Updated Vital Signs BP (!) 122/59 (BP Location: Right Arm)   Pulse 85   Temp 99.1 F (37.3 C)   Resp 20   Ht 5' (1.524 m)   Wt 86.6 kg   SpO2 95%   BMI 37.30 kg/m  Physical Exam Vitals and nursing note reviewed.  Constitutional:      General: She is not in acute distress.    Appearance: She is ill-appearing. She is not toxic-appearing or diaphoretic.  HENT:     Head: Normocephalic and atraumatic.  Eyes:     General: No scleral icterus.    Conjunctiva/sclera: Conjunctivae normal.  Cardiovascular:     Rate and Rhythm: Normal rate and regular rhythm.     Pulses: Normal pulses.     Heart sounds: Normal heart sounds.  Pulmonary:     Effort: Pulmonary effort is normal. No respiratory distress.     Breath sounds: No stridor. Wheezing present.  Chest:     Chest wall: No tenderness.  Abdominal:     General: Abdomen is flat. Bowel sounds are normal. There is no distension.     Palpations: Abdomen is soft. There is no mass.     Tenderness: There is no abdominal tenderness. There is no right CVA tenderness or left CVA tenderness.  Musculoskeletal:     Right lower leg: No edema.     Left lower leg: No edema.  Skin:    General: Skin is warm and dry.     Capillary Refill: Capillary refill takes less than 2 seconds.     Findings: No lesion.  Neurological:     General: No focal deficit present.     Mental Status: She is alert and oriented to person, place, and time. Mental status is at baseline.     Cranial Nerves: No cranial nerve deficit.     Motor: No weakness.     ED Results / Procedures / Treatments   Labs (all labs ordered are listed, but only abnormal results are displayed) Labs Reviewed - No data to display  EKG None  Radiology No results found.  Procedures Procedures    Medications Ordered in ED Medications  albuterol (PROVENTIL) (2.5 MG/3ML) 0.083% nebulizer solution 5 mg (5 mg Nebulization  Given 03/26/23 1833)    ED Course/ Medical Decision Making/ A&P                                 Medical Decision Making Amount and/or Complexity of Data Reviewed Labs: ordered. Radiology: ordered.  Risk OTC drugs. Prescription drug management. Decision regarding hospitalization.   [name] 77 y.o. presented today for shortness of breath.  Working DDx that I considered at this time includes, but not limited to, asthma/COPD exacerbation, URI, viral illness, anemia, ACS, PE, pneumonia, pleural effusion, lung mass.  R/o DDx: These are considered less likely due to history of present illness, physical exam, labs/imaging findings  Pmhx: hypertension,  A-fib, chronic respiratory failure, congestive heart failure, COPD  Review of prior external notes: 03/25/2023  Unique Tests and My Interpretation:  CBC: 14.9 white blood cell count, hemoglobin 11.2 BMP: Potassium is 2.7 following several albuterol inhalers, BUN and creatinine within normal limits Lactic 2.7, magnesium pending, blood cultures pending BNP 48.5 EKG: LBBB  CXR: Likely with small likely pleural effusion  Respiratory Panel: Neg  Consult to hospitalist who agreed to admission for sepsis and COPD exacerbation. Consult with pharm who agree to Doxy given interaction with Tikoysn   Problem List / ED Course / Critical interventions / Medication management  Patient reporting to emergency room with mild shortness of breath, increased cough productive, increased oxygen need.  Patient had some improvement of symptoms after inhaler and steroid.  Given that patient's x-ray does not show an obvious pneumonia, symptoms appear to be from COPD exacerbation.  Patient is septic with fever of 102.7.  Ordered septic workup and started IV antibiotics.  Patient improved with DuoNebs, improved wheezing and improved shortness of breath.  Patient has continued to cough.  Given the x-ray does not show obvious pneumonia, presentation is more  consistent with COPD exacerbation.  Due to sepsis presentation as well as increased need for oxygen, admission to hospital. I ordered medication including continuous DuoNeb, methylprednisone, Tylenol, DuoNeb, ceftriaxone, Doxy, potassium to supplement potassium Reevaluation of the patient after these medicines showed that the patient stayed the same Patients vitals assessed. Upon arrival patient is  hemodynamically stable.  I have reviewed the patients home medicines and have made adjustments as needed     Plan: Patient was originally given Rocephin and azithromycin however azithromycin contraindicated with Tikosyn so doxycycline was ordered pharmacy was consulted Admit for sepsis with respiratory source, likely COPD Exacerbation          Final Clinical Impression(s) / ED Diagnoses Final diagnoses:  None    Rx / DC Orders ED Discharge Orders     None         Smitty Knudsen, PA-C 03/26/23 2354    Laurence Spates, MD 03/27/23 0110

## 2023-03-26 NOTE — ED Notes (Signed)
RT called to room to reassess pt for SOB. Pt rhonchi/coarse crackles/exp whz w/no distress noted. Pt w/hx of COPD on [place] 5 Lpm at home per pt. Pt given 5/5 for wheezing. Pt post tx BLBS rhonchi/coarse crackles. Pt respiratory status stable on  5 Lpm. RT will continue to monitor while at [facility].    03/26/23 1942  Aerosol Therapy Tx  $ Hand Held Nebulizer  2  Medications Albuterol;Duoneb  Delivery Source Air  Delivery Device HHN  Pre-Treatment Pulse 84  Pre-Treatment Respirations 22  Treatment Tolerance Tolerated well  Treatment Given 2  MEWS Score/Color  MEWS Score 2  MEWS Score Color Yellow  RT Breath Sounds  Bilateral Breath Sounds Rhonchi;Expiratory wheezes;Coarse crackles  R Upper  Breath Sounds Rhonchi;Expiratory wheezes  L Upper Breath Sounds Rhonchi;Expiratory wheezes  R Lower Breath Sounds Coarse crackles;Expiratory wheezes  L Lower Breath Sounds Coarse crackles;Expiratory wheezes  Oxygen Therapy/Pulse Ox  O2 Device Nasal Cannula  O2 Therapy Oxygen  O2 Flow Rate (L/min) 5 L/min  FiO2 (%) 40 %

## 2023-03-26 NOTE — Progress Notes (Addendum)
Plan of Care Note for accepted transfer   Patient: Kathleen [name] MRN: [id number]   DOA: 03/26/2023  Facility requesting transfer: [facility]   Requesting Provider: Dr. Earlene Plater   Reason for transfer: COPD exacerbation   Facility course: 77 yr old female with hx of COPD, chronic hypoxic respiratory failure, PAF on Xarelto, HFrEF, DM, CKD 3A, and colovesical fistula who presents with fever, chills, and increased cough and SOB.   She is febrile and mildly tachypneic in ED with WBC 14,900, nomral lactate, and normal BNP. No obvious pneumonia on CXR.   Blood cultures were collected in the ED and she was treated with IV Solu-Medrol, DuoNeb, albuterol neb x2, Rocpehin, doxycycline, and IVF.   Plan of care: The patient is accepted for admission to Telemetry unit, at [facility].   Author: Briscoe Deutscher, MD 03/26/2023  Check [email or web address] for on-call coverage.  Nursing staff, Please call [facility] Admits & Consults System-Wide number on Amion as soon as patient's arrival, so appropriate admitting provider can evaluate the pt.

## 2023-03-26 NOTE — ED Triage Notes (Signed)
Pt reports cough x 4 days, concerned for pneumonia, pt on 5L [place] at home

## 2023-03-26 NOTE — Telephone Encounter (Signed)
Fax received from [facility]  to perform a endoscopic procedure on patient.  Patient needs surgery clearance. Surgery is 05/03/23. Patient was seen on 02/12/23 for acute visit. Office protocol is a risk assessment can be sent to surgeon if patient has been seen in 60 days or less.   Pt is scheduled for appt with Dr Vassie Loll for 04/07/23. Will wait until this visit for risk assessment. Adding this need to appt notes.

## 2023-03-27 ENCOUNTER — Inpatient Hospital Stay (HOSPITAL_COMMUNITY): Payer: Medicare Other

## 2023-03-27 DIAGNOSIS — J441 Chronic obstructive pulmonary disease with (acute) exacerbation: Secondary | ICD-10-CM | POA: Diagnosis not present

## 2023-03-27 DIAGNOSIS — J189 Pneumonia, unspecified organism: Secondary | ICD-10-CM

## 2023-03-27 DIAGNOSIS — A419 Sepsis, unspecified organism: Secondary | ICD-10-CM | POA: Diagnosis not present

## 2023-03-27 DIAGNOSIS — Z7901 Long term (current) use of anticoagulants: Secondary | ICD-10-CM | POA: Diagnosis not present

## 2023-03-27 LAB — COMPREHENSIVE METABOLIC PANEL
ALT: 10 U/L (ref 0–44)
AST: 16 U/L (ref 15–41)
Albumin: 3.2 g/dL — ABNORMAL LOW (ref 3.5–5.0)
Alkaline Phosphatase: 51 U/L (ref 38–126)
Anion gap: 11 (ref 5–15)
BUN: 20 mg/dL (ref 8–23)
CO2: 23 mmol/L (ref 22–32)
Calcium: 8.5 mg/dL — ABNORMAL LOW (ref 8.9–10.3)
Chloride: 102 mmol/L (ref 98–111)
Creatinine, Ser: 0.65 mg/dL (ref 0.44–1.00)
GFR, Estimated: >60 mL/min (ref >60)
Glucose, Bld: 137 mg/dL — ABNORMAL HIGH (ref 70–99)
Potassium: 3.7 mmol/L (ref 3.5–5.1)
Sodium: 136 mmol/L (ref 135–145)
Total Bilirubin: 0.5 mg/dL (ref 0.3–1.2)
Total Protein: 6.2 g/dL — ABNORMAL LOW (ref 6.5–8.1)

## 2023-03-27 LAB — BASIC METABOLIC PANEL
Anion gap: 15 (ref 5–15)
BUN: 23 mg/dL (ref 8–23)
CO2: 23 mmol/L (ref 22–32)
Calcium: 8.4 mg/dL — ABNORMAL LOW (ref 8.9–10.3)
Chloride: 96 mmol/L — ABNORMAL LOW (ref 98–111)
Creatinine, Ser: 0.93 mg/dL (ref 0.44–1.00)
GFR, Estimated: >60 mL/min (ref >60)
Glucose, Bld: 247 mg/dL — ABNORMAL HIGH (ref 70–99)
Potassium: 3.2 mmol/L — ABNORMAL LOW (ref 3.5–5.1)
Sodium: 134 mmol/L — ABNORMAL LOW (ref 135–145)

## 2023-03-27 LAB — URINALYSIS, ROUTINE W REFLEX MICROSCOPIC
Bilirubin Urine: NEGATIVE
Glucose, UA: NEGATIVE mg/dL
Ketones, ur: NEGATIVE mg/dL
Nitrite: NEGATIVE
Protein, ur: NEGATIVE mg/dL
Specific Gravity, Urine: 1.005 (ref 1.005–1.030)
WBC, UA: >50 WBC/hpf (ref 0–5)
pH: 5 (ref 5.0–8.0)

## 2023-03-27 LAB — PHOSPHORUS
Phosphorus: 2.3 mg/dL — ABNORMAL LOW (ref 2.5–4.6)
Phosphorus: 2.4 mg/dL — ABNORMAL LOW (ref 2.5–4.6)

## 2023-03-27 LAB — CBC WITH DIFFERENTIAL/PLATELET
Abs Immature Granulocytes: 0.1 10*3/uL — ABNORMAL HIGH (ref 0.00–0.07)
Abs Immature Granulocytes: 0.18 10*3/uL — ABNORMAL HIGH (ref 0.00–0.07)
Basophils Absolute: 0 10*3/uL (ref 0.0–0.1)
Basophils Absolute: 0 10*3/uL (ref 0.0–0.1)
Basophils Relative: 0 %
Basophils Relative: 0 %
Eosinophils Absolute: 0 10*3/uL (ref 0.0–0.5)
Eosinophils Absolute: 0 10*3/uL (ref 0.0–0.5)
Eosinophils Relative: 0 %
Eosinophils Relative: 0 %
HCT: 31.5 % — ABNORMAL LOW (ref 36.0–46.0)
HCT: 32.3 % — ABNORMAL LOW (ref 36.0–46.0)
Hemoglobin: 9.7 g/dL — ABNORMAL LOW (ref 12.0–15.0)
Hemoglobin: 10.1 g/dL — ABNORMAL LOW (ref 12.0–15.0)
Immature Granulocytes: 1 %
Immature Granulocytes: 1 %
Lymphocytes Relative: 3 %
Lymphocytes Relative: 3 %
Lymphs Abs: 0.4 10*3/uL — ABNORMAL LOW (ref 0.7–4.0)
Lymphs Abs: 0.5 10*3/uL — ABNORMAL LOW (ref 0.7–4.0)
MCH: 26.6 pg (ref 26.0–34.0)
MCH: 26.8 pg (ref 26.0–34.0)
MCHC: 30.8 g/dL (ref 30.0–36.0)
MCHC: 31.3 g/dL (ref 30.0–36.0)
MCV: 86.5 fL (ref 80.0–100.0)
MCV: 85.7 fL (ref 80.0–100.0)
Monocytes Absolute: 0.1 10*3/uL (ref 0.1–1.0)
Monocytes Absolute: 0.3 10*3/uL (ref 0.1–1.0)
Monocytes Relative: 1 %
Monocytes Relative: 2 %
Neutro Abs: 11.6 10*3/uL — ABNORMAL HIGH (ref 1.7–7.7)
Neutro Abs: 15 10*3/uL — ABNORMAL HIGH (ref 1.7–7.7)
Neutrophils Relative %: 95 %
Neutrophils Relative %: 94 %
Platelets: 200 10*3/uL (ref 150–400)
Platelets: 210 10*3/uL (ref 150–400)
RBC: 3.64 MIL/uL — ABNORMAL LOW (ref 3.87–5.11)
RBC: 3.77 MIL/uL — ABNORMAL LOW (ref 3.87–5.11)
RDW: 14.3 % (ref 11.5–15.5)
RDW: 14.3 % (ref 11.5–15.5)
WBC: 12.2 10*3/uL — ABNORMAL HIGH (ref 4.0–10.5)
WBC: 16 10*3/uL — ABNORMAL HIGH (ref 4.0–10.5)
nRBC: 0 % (ref 0.0–0.2)
nRBC: 0 % (ref 0.0–0.2)

## 2023-03-27 LAB — RESPIRATORY PANEL BY PCR

## 2023-03-27 LAB — MAGNESIUM
Magnesium: 1.7 mg/dL (ref 1.7–2.4)
Magnesium: 1.7 mg/dL (ref 1.7–2.4)

## 2023-03-27 LAB — GLUCOSE, CAPILLARY
Glucose-Capillary: 139 mg/dL — ABNORMAL HIGH (ref 70–99)
Glucose-Capillary: 144 mg/dL — ABNORMAL HIGH (ref 70–99)
Glucose-Capillary: 195 mg/dL — ABNORMAL HIGH (ref 70–99)
Glucose-Capillary: 223 mg/dL — ABNORMAL HIGH (ref 70–99)
Glucose-Capillary: 262 mg/dL — ABNORMAL HIGH (ref 70–99)

## 2023-03-27 LAB — LACTIC ACID, PLASMA
Lactic Acid, Venous: 6.1 mmol/L (ref 0.5–1.9)
Lactic Acid, Venous: 5.2 mmol/L (ref 0.5–1.9)
Lactic Acid, Venous: 3.7 mmol/L (ref 0.5–1.9)
Lactic Acid, Venous: 4.6 mmol/L (ref 0.5–1.9)
Lactic Acid, Venous: 3.6 mmol/L (ref 0.5–1.9)
Lactic Acid, Venous: 3.9 mmol/L (ref 0.5–1.9)

## 2023-03-27 LAB — HEPATIC FUNCTION PANEL
ALT: 10 U/L (ref 0–44)
AST: 16 U/L (ref 15–41)
Albumin: 3.1 g/dL — ABNORMAL LOW (ref 3.5–5.0)
Alkaline Phosphatase: 50 U/L (ref 38–126)
Bilirubin, Direct: 0.1 mg/dL (ref 0.0–0.2)
Indirect Bilirubin: 0.7 mg/dL (ref 0.3–0.9)
Total Bilirubin: 0.8 mg/dL (ref 0.3–1.2)
Total Protein: 6.2 g/dL — ABNORMAL LOW (ref 6.5–8.1)

## 2023-03-27 LAB — PROCALCITONIN: Procalcitonin: 0.14 ng/mL

## 2023-03-27 MED ORDER — LOSARTAN POTASSIUM 25 MG PO TABS
12.5000 mg | ORAL_TABLET | Freq: Every day | ORAL | Status: DC
  Administered 2023-03-27 – 2023-04-04 (×9): 12.5 mg via ORAL
  Filled 2023-03-27 (×9): qty 0.5

## 2023-03-27 MED ORDER — METHYLPREDNISOLONE SODIUM SUCC 40 MG IJ SOLR
40.0000 mg | Freq: Two times a day (BID) | INTRAMUSCULAR | Status: AC
  Administered 2023-03-27 (×2): 40 mg via INTRAVENOUS
  Filled 2023-03-27 (×2): qty 1

## 2023-03-27 MED ORDER — PROCHLORPERAZINE EDISYLATE 10 MG/2ML IJ SOLN
5.0000 mg | INTRAMUSCULAR | Status: DC | PRN
  Filled 2023-03-27: qty 2

## 2023-03-27 MED ORDER — IPRATROPIUM-ALBUTEROL 0.5-2.5 (3) MG/3ML IN SOLN
3.0000 mL | Freq: Four times a day (QID) | RESPIRATORY_TRACT | Status: DC
  Administered 2023-03-27: 3 mL via RESPIRATORY_TRACT
  Filled 2023-03-27: qty 3

## 2023-03-27 MED ORDER — SODIUM CHLORIDE 0.9 % IV SOLN
INTRAVENOUS | Status: AC
  Administered 2023-03-27: 75 mL/h via INTRAVENOUS

## 2023-03-27 MED ORDER — HYDROCODONE BIT-HOMATROP MBR 5-1.5 MG/5ML PO SOLN
5.0000 mL | ORAL | Status: DC | PRN
  Administered 2023-03-27 – 2023-03-28 (×9): 5 mL via ORAL
  Filled 2023-03-27 (×9): qty 5

## 2023-03-27 MED ORDER — GUAIFENESIN ER 600 MG PO TB12
1200.0000 mg | ORAL_TABLET | Freq: Two times a day (BID) | ORAL | Status: DC
  Administered 2023-03-27 – 2023-03-28 (×3): 1200 mg via ORAL
  Filled 2023-03-27 (×3): qty 2

## 2023-03-27 MED ORDER — BENZONATATE 100 MG PO CAPS
100.0000 mg | ORAL_CAPSULE | Freq: Three times a day (TID) | ORAL | Status: DC | PRN
  Administered 2023-03-27: 100 mg via ORAL
  Filled 2023-03-27: qty 1

## 2023-03-27 MED ORDER — SODIUM CHLORIDE 0.9 % IV SOLN
1.0000 g | INTRAVENOUS | Status: DC
  Administered 2023-03-27: 1 g via INTRAVENOUS
  Filled 2023-03-27: qty 10

## 2023-03-27 MED ORDER — ALBUTEROL SULFATE (2.5 MG/3ML) 0.083% IN NEBU
2.5000 mg | INHALATION_SOLUTION | Freq: Four times a day (QID) | RESPIRATORY_TRACT | Status: DC
  Administered 2023-03-27 – 2023-03-29 (×7): 2.5 mg via RESPIRATORY_TRACT
  Filled 2023-03-27 (×8): qty 3

## 2023-03-27 MED ORDER — SODIUM CHLORIDE 0.9 % IV SOLN
500.0000 mg | INTRAVENOUS | Status: DC
  Filled 2023-03-27: qty 5

## 2023-03-27 MED ORDER — INSULIN ASPART 100 UNIT/ML IJ SOLN
0.0000 [IU] | Freq: Three times a day (TID) | INTRAMUSCULAR | Status: DC
  Administered 2023-03-27: 5 [IU] via SUBCUTANEOUS
  Administered 2023-03-27: 2 [IU] via SUBCUTANEOUS
  Administered 2023-03-27 – 2023-03-28 (×2): 3 [IU] via SUBCUTANEOUS
  Administered 2023-03-28 (×2): 2 [IU] via SUBCUTANEOUS
  Administered 2023-03-29: 5 [IU] via SUBCUTANEOUS
  Administered 2023-03-29: 2 [IU] via SUBCUTANEOUS
  Administered 2023-03-29 – 2023-03-30 (×4): 3 [IU] via SUBCUTANEOUS
  Administered 2023-03-31: 2 [IU] via SUBCUTANEOUS
  Administered 2023-03-31 (×2): 3 [IU] via SUBCUTANEOUS
  Administered 2023-04-01: 5 [IU] via SUBCUTANEOUS
  Administered 2023-04-01 – 2023-04-02 (×4): 2 [IU] via SUBCUTANEOUS
  Administered 2023-04-02 – 2023-04-03 (×2): 5 [IU] via SUBCUTANEOUS
  Administered 2023-04-03: 2 [IU] via SUBCUTANEOUS
  Administered 2023-04-04: 3 [IU] via SUBCUTANEOUS

## 2023-03-27 MED ORDER — PREDNISONE 20 MG PO TABS
40.0000 mg | ORAL_TABLET | Freq: Every day | ORAL | Status: DC
  Administered 2023-03-28: 40 mg via ORAL
  Filled 2023-03-27: qty 2

## 2023-03-27 MED ORDER — REVEFENACIN 175 MCG/3ML IN SOLN
175.0000 ug | Freq: Every day | RESPIRATORY_TRACT | Status: DC
  Administered 2023-03-27 – 2023-04-04 (×9): 175 ug via RESPIRATORY_TRACT
  Filled 2023-03-27 (×9): qty 3

## 2023-03-27 MED ORDER — UMECLIDINIUM BROMIDE 62.5 MCG/ACT IN AEPB
1.0000 | INHALATION_SPRAY | Freq: Every day | RESPIRATORY_TRACT | Status: DC
  Administered 2023-03-27 – 2023-03-30 (×4): 1 via RESPIRATORY_TRACT
  Filled 2023-03-27: qty 7

## 2023-03-27 MED ORDER — LACTATED RINGERS IV BOLUS
1000.0000 mL | Freq: Once | INTRAVENOUS | Status: AC
  Administered 2023-03-27: 1000 mL via INTRAVENOUS

## 2023-03-27 MED ORDER — DOFETILIDE 250 MCG PO CAPS
250.0000 ug | ORAL_CAPSULE | Freq: Two times a day (BID) | ORAL | Status: DC
  Administered 2023-03-27 – 2023-04-03 (×15): 250 ug via ORAL
  Filled 2023-03-27 (×18): qty 1

## 2023-03-27 MED ORDER — AZITHROMYCIN 250 MG PO TABS: 500.0000 mg | ORAL_TABLET | Freq: Every day | ORAL | Status: DC

## 2023-03-27 MED ORDER — POTASSIUM CHLORIDE CRYS ER 20 MEQ PO TBCR
40.0000 meq | EXTENDED_RELEASE_TABLET | Freq: Every day | ORAL | Status: DC
  Administered 2023-03-27 – 2023-04-04 (×9): 40 meq via ORAL
  Filled 2023-03-27 (×10): qty 2

## 2023-03-27 MED ORDER — PHENOL 1.4 % MT LIQD
1.0000 | OROMUCOSAL | Status: DC | PRN
  Administered 2023-03-27: 1 via OROMUCOSAL
  Filled 2023-03-27: qty 177

## 2023-03-27 MED ORDER — POTASSIUM CHLORIDE IN NACL 20-0.9 MEQ/L-% IV SOLN
INTRAVENOUS | Status: DC
  Filled 2023-03-27: qty 1000

## 2023-03-27 MED ORDER — INSULIN ASPART 100 UNIT/ML IJ SOLN
0.0000 [IU] | Freq: Every day | INTRAMUSCULAR | Status: DC
  Administered 2023-03-27: 3 [IU] via SUBCUTANEOUS

## 2023-03-27 MED ORDER — SODIUM CHLORIDE 0.9 % IV SOLN
100.0000 mg | Freq: Two times a day (BID) | INTRAVENOUS | Status: DC
  Administered 2023-03-27 – 2023-03-28 (×2): 100 mg via INTRAVENOUS
  Filled 2023-03-27 (×3): qty 100

## 2023-03-27 MED ORDER — SODIUM CHLORIDE 0.9 % IV BOLUS
1000.0000 mL | Freq: Once | INTRAVENOUS | Status: AC
  Administered 2023-03-27: 1000 mL via INTRAVENOUS

## 2023-03-27 MED ORDER — GUAIFENESIN 100 MG/5ML PO LIQD: 5.0000 mL | ORAL | Status: DC | PRN

## 2023-03-27 MED ORDER — POTASSIUM CHLORIDE IN NACL 20-0.9 MEQ/L-% IV SOLN
INTRAVENOUS | Status: AC
  Administered 2023-03-27: 100 mL/h via INTRAVENOUS

## 2023-03-27 MED ORDER — POTASSIUM CHLORIDE CRYS ER 20 MEQ PO TBCR
40.0000 meq | EXTENDED_RELEASE_TABLET | Freq: Once | ORAL | Status: AC
  Administered 2023-03-27: 40 meq via ORAL
  Filled 2023-03-27: qty 2

## 2023-03-27 MED ORDER — FLUTICASONE FUROATE-VILANTEROL 100-25 MCG/ACT IN AEPB
1.0000 | INHALATION_SPRAY | Freq: Every day | RESPIRATORY_TRACT | Status: DC
  Administered 2023-03-27 – 2023-04-04 (×8): 1 via RESPIRATORY_TRACT
  Filled 2023-03-27: qty 28

## 2023-03-27 MED ORDER — ALBUTEROL SULFATE (2.5 MG/3ML) 0.083% IN NEBU: 2.5000 mg | INHALATION_SOLUTION | RESPIRATORY_TRACT | Status: DC | PRN

## 2023-03-27 MED ORDER — RIVAROXABAN 20 MG PO TABS
20.0000 mg | ORAL_TABLET | Freq: Every day | ORAL | Status: DC
  Administered 2023-03-27 – 2023-04-03 (×8): 20 mg via ORAL
  Filled 2023-03-27 (×8): qty 1

## 2023-03-27 MED ORDER — TORSEMIDE 20 MG PO TABS: 60.0000 mg | ORAL_TABLET | Freq: Every day | ORAL | Status: DC

## 2023-03-27 MED ORDER — SODIUM CHLORIDE 0.9 % IV BOLUS
500.0000 mL | Freq: Once | INTRAVENOUS | Status: AC
  Administered 2023-03-27: 500 mL via INTRAVENOUS

## 2023-03-27 MED ORDER — GABAPENTIN 100 MG PO CAPS
200.0000 mg | ORAL_CAPSULE | Freq: Every day | ORAL | Status: DC
  Administered 2023-03-27 – 2023-04-03 (×8): 200 mg via ORAL
  Filled 2023-03-27 (×8): qty 2

## 2023-03-27 MED ORDER — HYDROCODONE BIT-HOMATROP MBR 5-1.5 MG/5ML PO SOLN
5.0000 mL | Freq: Once | ORAL | Status: AC
  Administered 2023-03-27: 5 mL via ORAL
  Filled 2023-03-27: qty 5

## 2023-03-27 MED ORDER — DM-GUAIFENESIN ER 30-600 MG PO TB12
1.0000 | ORAL_TABLET | Freq: Two times a day (BID) | ORAL | Status: DC
  Administered 2023-03-27 – 2023-04-04 (×18): 1 via ORAL
  Filled 2023-03-27 (×18): qty 1

## 2023-03-27 MED ORDER — IPRATROPIUM BROMIDE 0.02 % IN SOLN: 0.5000 mg | Freq: Four times a day (QID) | RESPIRATORY_TRACT | Status: DC

## 2023-03-27 MED ORDER — MAGNESIUM SULFATE 2 GM/50ML IV SOLN
2.0000 g | Freq: Once | INTRAVENOUS | Status: AC
  Administered 2023-03-27: 2 g via INTRAVENOUS
  Filled 2023-03-27: qty 50

## 2023-03-27 NOTE — Plan of Care (Signed)

## 2023-03-27 NOTE — Evaluation (Signed)
Occupational Therapy Evaluation Patient Details Name: Kathleen [name] MRN: [id number] DOB: Mar 30, 1946 Today's Date: 03/27/2023   History of Present Illness The pt is a 77 yo female presenting 10/18 with Acute exacerbation COPD, Pneumonia, lactic acidosis, severe sepsis   Chronic respiratory failure with hypoxia. Pt with admission last month to [facility] with brown stool/discharge from vagina x1 week. 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  August previous admission to [facility] for COPD exacerbation.  PMH includes: COPD on 4-5L O2, asthma, OSA on bipap, afib, CHF.   Clinical Impression   Patient is currently requiring very light assistance with ADLs including up to Min assist with dynamic standing Lower body ADLs and IADLs, at no more than setup assist with Upper body ADLs,  as well as  no needed assist with bed mobility and no needed assist with functional transfers to toilet, however pt did demonstrate a mild swaying LOB but able to self correct.  Current level of function is below patient's typical baseline.    During this evaluation, patient was limited by generalized weakness, impaired activity tolerance, and frequent coughing which causes tolerable pain to throat and chest, all of which has the potential to impact patient's safety and independence during functional mobility, as well as performance for ADLs.  Patient lives alone at her ILF with a son nearby, who is able to provide PRN supervision and assistance.  Patient demonstrates good rehab potential, and should benefit from continued skilled occupational therapy services while in acute care to maximize safety, independence and quality of life at home.  Do not anticipate much acute OT needed.  Continued occupational therapy services in the home to address IADLs are recommended.  ?        If plan is discharge home, recommend the following: A little help with bathing/dressing/bathroom;Assistance with cooking/housework;Assist for  transportation    Functional Status Assessment  Patient has had a recent decline in their functional status and demonstrates the ability to make significant improvements in function in a reasonable and predictable amount of time.  Equipment Recommendations  Other (comment) (Attachable bidet)    Recommendations for Other Services       Precautions / Restrictions Precautions Precautions: Fall Precaution Comments: Keep SpO2 above 88% Restrictions Other Position/Activity Restrictions: Fragile skin      Mobility Bed Mobility Overal bed mobility: Modified Independent             General bed mobility comments: HOB elevated    Transfers                          Balance Overall balance assessment: Mild deficits observed, not formally tested                                         ADL either performed or assessed with clinical judgement   ADL Overall ADL's : Needs assistance/impaired Eating/Feeding: Independent   Grooming: Wash/dry hands;Oral care;Standing;Supervision/safety   Upper Body Bathing: Supervision/ safety;Sitting   Lower Body Bathing: Contact guard assist;Sitting/lateral leans;Sit to/from stand   Upper Body Dressing : Set up;Sitting   Lower Body Dressing: Contact guard assist;Minimal assistance;Sitting/lateral leans;Sit to/from stand   Toilet Transfer: Comfort height toilet;Ambulation;Supervision/safety Toilet Transfer Details (indicate cue type and reason): 1 sway off balance while ambulting from bathroom, but pt able to self correct. Toileting- Clothing Manipulation and Hygiene: Supervision/safety;Sitting/lateral lean Toileting - Clothing  Manipulation Details (indicate cue type and reason): Increased effort due to fistula. Pt educated on bidet options for energy conservation and thoroughness     Functional mobility during ADLs: Supervision/safety;Contact guard assist       Vision Baseline Vision/History: 1 Wears glasses  (readers only) Ability to See in Adequate Light: 0 Adequate Vision Assessment?: No apparent visual deficits     Perception         Praxis         Pertinent Vitals/Pain Pain Assessment Pain Assessment: Faces Faces Pain Scale: Hurts a little bit Pain Location: throat and chest from coughing     Extremity/Trunk Assessment Upper Extremity Assessment Upper Extremity Assessment: Overall WFL for tasks assessed   Lower Extremity Assessment Lower Extremity Assessment: Defer to PT evaluation       Communication Communication Communication: No apparent difficulties;Other (comment) (Frequent coughing when talking)   Cognition Arousal: Alert Behavior During Therapy: WFL for tasks assessed/performed Overall Cognitive Status: Within Functional Limits for tasks assessed                                       General Comments       Exercises     Shoulder Instructions      Home Living Family/patient expects to be discharged to:: Private residence Living Arrangements: Alone Available Help at Discharge: Family;Available PRN/intermittently (Son is "caretaker") Type of Home: Independent living facility Home Access: Elevator     Home Layout: One level     Bathroom Shower/Tub: Producer, television/film/video: Handicapped height Bathroom Accessibility: Yes How Accessible: Accessible via walker Home Equipment: Rollator (4 wheels);Cane - single point;Shower seat;Grab bars - tub/shower;Hand held shower head;Other (comment);Adaptive equipment Adaptive Equipment: Reacher;Long-handled sponge Additional Comments: [facility]. has shower chair, hand held shower head; walk in shower. elevators within the home.  Home O2 at 4L-5L on portable.      Prior Functioning/Environment Prior Level of Function : Independent/Modified Independent             Mobility Comments: no DME to walk in her apt, uses rollator for longer distances, has been getting PT [facility] for  shoulders. Does not drive. Son assists and pt looking into transportation options. ADLs Comments: independent, reports is sedentary but completes adls and cooking without assistance. Pt has one meal provided per day, but prefers to cook her own food. Uses reacher for laundry. Housekeeping services but working on frequency. Son delivers groceries.        OT Problem List: Obesity;Decreased activity tolerance;Impaired balance (sitting and/or standing);Decreased knowledge of use of DME or AE      OT Treatment/Interventions: Self-care/ADL training;Therapeutic activities;Patient/family education;Energy conservation;DME and/or AE instruction;Balance training    OT Goals(Current goals can be found in the care plan section) Acute Rehab OT Goals Patient Stated Goal: Increased ease with peri care post colovesical fistula. OT Goal Formulation: With patient Time For Goal Achievement: 04/10/23 Potential to Achieve Goals: Good ADL Goals Pt Will Perform Grooming: with modified independence;standing (tolerating 3/3 tasks with VSS) Pt Will Perform Toileting - Clothing Manipulation and hygiene: with adaptive equipment (Pt will verbalize understandng to adaptive options to assist with hygiene and concerving energy post void.) Additional ADL Goal #1: Patient will identify at least 3 energy conservation strategies to employ at home in order to maximize function and quality of life and decrease caregiver burden while preventing exacerbation of symptoms and rehospitalization.  Additional ADL Goal #2: Pt will engage in 15 min functional activities without loss of sitting or standing balance and taking brief frequent rest breaks, sitting or standing to demonstrate pacing to avoid over-exhaustion, in order to show  improved activity tolerance and balance needed to perform ADLs safely at home.  OT Frequency: Min 1X/week    Co-evaluation              AM-PAC OT "6 Clicks" Daily Activity     Outcome Measure Help  from another person eating meals?: None Help from another person taking care of personal grooming?: A Little Help from another person toileting, which includes using toliet, bedpan, or urinal?: A Little Help from another person bathing (including washing, rinsing, drying)?: A Little Help from another person to put on and taking off regular upper body clothing?: None Help from another person to put on and taking off regular lower body clothing?: A Little 6 Click Score: 20   End of Session Equipment Utilized During Treatment: Oxygen Nurse Communication: Mobility status  Activity Tolerance: Patient tolerated treatment well Patient left: in chair;with call bell/phone within reach  OT Visit Diagnosis: Unsteadiness on feet (R26.81) (Decreased ADLs)                Time: 0865-7846 OT Time Calculation (min): 41 min Charges:  OT General Charges $OT Visit: 1 Visit OT Evaluation $OT Eval Low Complexity: 1 Low OT Treatments $Self Care/Home Management : 8-22 mins $Therapeutic Activity: 8-22 mins  Victorino Dike, OT Acute Rehab Services Office: [phone] 03/27/2023   Theodoro Clock 03/27/2023, 12:26 PM

## 2023-03-27 NOTE — Progress Notes (Signed)
   03/27/23 2353  BiPAP/CPAP/SIPAP  BiPAP/CPAP/SIPAP Pt Type Adult  BiPAP/CPAP/SIPAP DREAMSTATIOND  Mask Type Full face mask  Mask Size Medium  Flow Rate 5 lpm  Patient Home Equipment No  Auto Titrate Yes (20/5)  CPAP/SIPAP surface wiped down Yes  BiPAP/CPAP /SiPAP Vitals  Pulse Rate 72  Resp 17  SpO2 98 %  MEWS Score/Color  MEWS Score 0  MEWS Score Color [place] Si

## 2023-03-27 NOTE — Evaluation (Signed)
Physical Therapy Evaluation Patient Details Name: Kathleen [name] MRN: [id number] DOB: 02/19/1946 Today's Date: 03/27/2023  History of Present Illness  The pt is a 77 yo female presenting 10/18 with Acute exacerbation COPD, Pneumonia, lactic acidosis, severe sepsis   Chronic respiratory failure with hypoxia. Pt with admission last month to [facility] with brown stool/discharge from vagina x1 week. 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  August previous admission to [facility] for COPD exacerbation.  PMH includes: COPD on 4-5L O2, asthma, OSA on bipap, afib, CHF.  Clinical Impression  Pt admitted with above diagnosis.  Pt in bathroom on arrival, assisted pt back to chair, deferred further amb d/t pt lunch arriving; SpO2=99%, returned to wall O2 at 5L. Continue to follow in acute setting. Resume HHPT at d/c  Pt currently with functional limitations due to the deficits listed below (see PT Problem List). Pt will benefit from acute skilled PT to increase their independence and safety with mobility to allow discharge.           If plan is discharge home, recommend the following: A little help with walking and/or transfers;Help with stairs or ramp for entrance;A little help with bathing/dressing/bathroom   Can travel by private vehicle        Equipment Recommendations None recommended by PT  Recommendations for Other Services       Functional Status Assessment Patient has had a recent decline in their functional status and demonstrates the ability to make significant improvements in function in a reasonable and predictable amount of time.     Precautions / Restrictions Precautions Precautions: Fall Precaution Comments: Keep SpO2 above 88% Restrictions Other Position/Activity Restrictions: Fragile skin      Mobility  Bed Mobility               General bed mobility comments: pt up in bathroom washing up on PT arrival    Transfers Overall transfer level: Needs  assistance Equipment used: Rolling walker (2 wheels) Transfers: Sit to/from Stand Sit to Stand: Supervision           General transfer comment: for lines and safety    Ambulation/Gait Ambulation/Gait assistance: Supervision, Contact guard assist Gait Distance (Feet): 10 Feet Assistive device: IV Pole, None Gait Pattern/deviations: Step-through pattern       General Gait Details: supervision for lines and safetey  Stairs            Wheelchair Mobility     Tilt Bed    Modified Rankin (Stroke Patients Only)       Balance                                             Pertinent Vitals/Pain Pain Assessment Pain Assessment: Faces Faces Pain Scale: Hurts a little bit Pain Location: throat and chest from coughing Pain Descriptors / Indicators: Sore Pain Intervention(s): Limited activity within patient's tolerance, Monitored during session    Home Living Family/patient expects to be discharged to:: Private residence Living Arrangements: Alone Available Help at Discharge: Family;Available PRN/intermittently Type of Home: Independent living facility Home Access: Elevator       Home Layout: One level Home Equipment: Rollator (4 wheels);Cane - single point;Shower seat;Grab bars - tub/shower;Hand held shower head;Other (comment);Adaptive equipment Additional Comments: [facility]. has shower chair, hand held shower head; walk in shower. elevators within the home.  Home O2 at 4L-5L on  portable.    Prior Function Prior Level of Function : Independent/Modified Independent             Mobility Comments: no DME to walk in her apt, uses rollator for longer distances, has been getting PT at [facility] for shoulders. Does not drive. Son assists and pt looking into transportation options. ADLs Comments: independent, reports is sedentary but completes adls and cooking without assistance. Pt has one meal provided per day, but prefers to cook her own food.  Uses [employer] for laundry. Housekeeping services but working on frequency. Son delivers groceries.     Extremity/Trunk Assessment   Upper Extremity Assessment Upper Extremity Assessment: Overall WFL for tasks assessed;Defer to OT evaluation    Lower Extremity Assessment Lower Extremity Assessment: Overall WFL for tasks assessed       Communication   Communication Communication: No apparent difficulties  Cognition Arousal: Alert Behavior During Therapy: WFL for tasks assessed/performed Overall Cognitive Status: Within Functional Limits for tasks assessed                                          General Comments      Exercises     Assessment/Plan    PT Assessment Patient needs continued PT services  PT Problem List Decreased activity tolerance;Decreased mobility;Cardiopulmonary status limiting activity       PT Treatment Interventions DME instruction;Gait training;Balance training;Therapeutic activities;Therapeutic exercise    PT Goals (Current goals can be found in the Care Plan section)  Acute Rehab PT Goals PT Goal Formulation: With patient Time For Goal Achievement: 04/03/23 Potential to Achieve Goals: Good    Frequency Min 1X/week     Co-evaluation               AM-PAC PT "6 Clicks" Mobility  Outcome Measure Help needed turning from your back to your side while in a flat bed without using bedrails?: A Little Help needed moving from lying on your back to sitting on the side of a flat bed without using bedrails?: A Little Help needed moving to and from a bed to a chair (including a wheelchair)?: A Little Help needed standing up from a chair using your arms (e.g., wheelchair or bedside chair)?: A Little Help needed to walk in hospital room?: A Little Help needed climbing 3-5 steps with a railing? : A Lot 6 Click Score: 17    End of Session   Activity Tolerance: Patient tolerated treatment well Patient left: with call bell/phone  within reach;in chair   PT Visit Diagnosis: Other abnormalities of gait and mobility (R26.89);Difficulty in walking, not elsewhere classified (R26.2)    Time: 8295-6213 PT Time Calculation (min) (ACUTE ONLY): 24 min   Charges:   PT Evaluation $PT Eval Low Complexity: 1 Low   PT General Charges $$ ACUTE PT VISIT: 1 Visit         Nivin Braniff, PT  Acute Rehab Dept [facility]) [phone]  03/27/2023   [facility] 03/27/2023, 1:55 PM

## 2023-03-27 NOTE — H&P (Signed)
PCP:   [name], MD   Chief Complaint:  Cervical  HPI: This is a 77 year old female with past medical history of OSA (on BiPAP), BOOP (noncandidate for stenting), COPD, chronic respiratory failure 5L, HFrEF, A. fib (s/p AVN ablation, BiV, on anticoagulation), HTN, gout, and colovesical fistula.  On Tuesday and Wednesday patient developed a dry, rhonchorous, racking nonproductive cough.  She developed wheezing, on and off fevers, Tmax 100 degrees at home.  She is a night she was diaphoretic, soaking nurse clothing and sheets.  Per patient on Wednesday she was called by cardiologist and told to take her Metalazone dose, 30 min later take her torsemide.  She was to have repeated this 2 days.  This was called based on I thought she was fluid overloaded.  The patient is followed by the advanced heart clinic and per patient they are able to remotely evaluate her fluid status. (Telephone conversation cardiologist NP noted on the 11th, without directions for additional diuretic usage).  Her son took her to [facility] ER.  In the ER patient Tmax 102.5, vitals otherwise stable.  Patient wheezing on presentation.  Potassium 2.7, magnesium normal.  BNP 48.5, lactic acid 2.7 ==> 6.1.  CXR cardiomegaly with hazy bibasilar atelectasis or infiltrates. Blood cultures x 2 collected, Solu-Medrol, Rocephin, IV doxycycline, Tylenol, 2 L NS, 40 meq K given in the ER.  She is accepted in transfer.  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    Concussion 10/04/2021   COPD (chronic obstructive pulmonary disease) (HCC)    Diverticulitis    Gout    Hypertension    Longstanding persistent atrial fibrillation (HCC)    on Xarelto   Nonischemic cardiomyopathy (HCC)     Obesity    Pacemaker    Spontaneous pneumothorax 2013   Past Surgical History:  Procedure Laterality Date   ABDOMINAL HYSTERECTOMY     APPENDECTOMY     ATRIAL FIBRILLATION ABLATION  07/20/2013   by Dr Christin Fudge   AV nodal ablation  11/01/2013   by Dr Christin Fudge, repeated by Dr Wilford Grist   BREAST BIOPSY Bilateral 1997   negative   CARDIAC CATHETERIZATION     CHOLECYSTECTOMY     HERNIA REPAIR     PACEMAKER INSERTION  06/2017   MDT Viva CRT-P implanted by Dr Christin Fudge after AV nodal ablation,  LV lead could not be placed   PACEMAKER INSERTION  05/2020   with lead bundle   RIGHT/LEFT HEART CATH AND CORONARY ANGIOGRAPHY N/A 03/20/2022   Procedure: RIGHT/LEFT HEART CATH AND CORONARY ANGIOGRAPHY;  Surgeon: [place]az[place], Peter M, MD;  Location: [facility] INVASIVE CV LAB;  Service: Cardiovascular;  Laterality: N/A;    Medications: Prior to Admission medications   Medication Sig Start Date End Date Taking? Authorizing Provider  acetaminophen (TYLENOL) 650 MG CR tablet Take 1,300 mg by mouth every 8 (eight) hours as needed for pain.    [provider]  azelastine (ASTELIN) 0.1 % nasal spray Place 2 sprays into both nostrils 2 (two) times daily. 11/05/22   Oretha Milch, MD  Cholecalciferol (VITAMIN D3) 50 MCG (2000 UT) TABS Take 2,000 Units by mouth at bedtime.    [provider]  cyanocobalamin (VITAMIN B12) 1000 MCG/ML injection  Inject 1 mL (1,000 mcg total) into the muscle every 30 (thirty) days. 05/05/22   [name], MD  dextromethorphan (DELSYM) 30 MG/5ML liquid Take 10 mLs by mouth as needed for cough.    [provider]  docusate sodium (COLACE) 100 MG capsule Take 100 mg by mouth 2 (two) times daily.    [provider]  dofetilide (TIKOSYN) 250 MCG capsule Take 1 capsule (250 mcg total) by mouth in the morning and at bedtime. 12/01/22 12/01/23  Fenton, Clint R, PA  fluticasone furoate-vilanterol (BREO ELLIPTA) 100-25 MCG/ACT AEPB INHALE 1 PUFF INTO THE LUNGS DAILY  03/23/23   Oretha Milch, MD  gabapentin (NEURONTIN) 100 MG capsule Take 2 capsules (200 mg total) by mouth at bedtime. 11/03/22 06/01/23  Windell Norfolk, MD  HYDROcodone-acetaminophen (NORCO/VICODIN) 5-325 MG tablet Take 1 tablet by mouth every 6 (six) hours as needed for severe pain. 02/24/23   Jerald Kief, MD  levalbuterol Pauline Aus) 0.63 MG/3ML nebulizer solution Take 3 mLs (0.63 mg total) by nebulization every 6 (six) hours as needed for wheezing or shortness of breath. DX J44.89 J96.21 01/20/23   Oretha Milch, MD  losartan (COZAAR) 25 MG tablet Take 12.5 mg by mouth daily.    [provider]  Magnesium 500 MG TABS Take 500 mg by mouth at bedtime.    [provider]  metolazone (ZAROXOLYN) 2.5 MG tablet Take 1 tablet (2.5 mg total) by mouth once a week. MUST TAKE OF POTASSIUM WITH THIS 03/24/23 06/22/23  Clegg, Amy D, NP  Na Sulfate-K Sulfate-Mg Sulf (SUPREP BOWEL PREP KIT) 17.5-3.13-1.6 GM/177ML SOLN Take 1 kit by mouth as directed. For colonoscopy prep 03/17/23   Mansouraty, Netty Starring., MD  OXYGEN Inhale 5 L into the lungs continuous. Use with resmed ventilator    [provider]  potassium chloride SA (KLOR-CON M) 20 MEQ tablet Take 2 tablets (40 mEq total) with weekly Metolazone dosage. 03/24/23   Clegg, Amy D, NP  revefenacin (YUPELRI) 175 MCG/3ML nebulizer solution Take 175 mcg by nebulization daily.    [provider]  rivaroxaban (XARELTO) 20 MG TABS tablet Take 1 tablet (20 mg total) by mouth daily with supper. 02/01/23   Bensimhon, [name], MD  torsemide (DEMADEX) 20 MG tablet Take 3 tablets (60 mg total) by mouth daily. 03/24/23 06/22/23  Sherald Hess, NP    Allergies:   Allergies  Allergen Reactions   Allopurinol Other (See Comments)    Reaction:  Dizziness    Clindamycin Anaphylaxis and Hives   Flublok [Influenza Vaccine Recombinant] Other (See Comments)    Fever 103 with no alternative explanation day after vaccine.  Clydie Braun Highfill  FNP-C   Pneumococcal 13-Val Conj Vacc Itching, Swelling and Rash   Dronedarone Rash   Montelukast Other (See Comments)    Makes her loopy.   Brovana [Arformoterol]     Caused muscle pain   Budesonide     Caused extreme joint pain   Entresto [Sacubitril-Valsartan] Other (See Comments)    hypotension   Fosamax [Alendronate Sodium] Nausea Only   Jardiance [Empagliflozin]     Caused a vaginal infection   Meperidine Nausea And Vomiting   Microplegia Msa-Msg [Plegisol]     Loopy,diarrhea   Rosuvastatin Other (See Comments)    Reaction:  Muscle spasms    Tetracycline Hives   Adhesive [Tape] Rash   Lovastatin Rash and Other (See Comments)    Muscle Pain    Social History:  reports that she  quit smoking about 35 years ago. Her smoking use included cigarettes. She started smoking about 55 years ago. She has a 20 pack-year smoking history. She has been exposed to tobacco smoke. She has never used smokeless tobacco. She reports that she does not drink alcohol and does not use drugs.  Family History: Family History  Problem Relation Age of Onset   Breast cancer Mother 50       3 different times   Pancreatic cancer Mother    Emphysema Father    Healthy Brother    Breast cancer Cousin    Valvular heart disease Son    Obesity Daughter    Colon cancer Neg Hx    Esophageal cancer Neg Hx    Stomach cancer Neg Hx    Inflammatory bowel disease Neg Hx    Liver disease Neg Hx    Rectal cancer Neg Hx     Physical Exam: Vitals:   03/26/23 2048 03/26/23 2100 03/26/23 2322 03/26/23 2344  BP:  118/64  133/62  Pulse:  94  80  Resp:    18  Temp: (!) 102.7 F (39.3 C)  98.1 F (36.7 C) 98.1 F (36.7 C)  TempSrc: Rectal  Oral Oral  SpO2:  97%  97%  Weight:      Height:        General: A and O x 3, morbidly obese, no acute distress Eyes: Pink conjunctiva, no scleral icterus ENT: Moist oral mucosa, neck supple, no thyromegaly Lungs: Coarse, rhonchi, mild wheeze, no use of accessory  muscles Cardiovascular: RRR, + JVD, no regurgitation, no gallops, no murmurs.  Abdomen: soft, positive BS, NTND, no organomegaly, not an acute abdomen GU: not examined Neuro: CN II - XII grossly intact, sensation intact Musculoskeletal: strength 5/5 all extremities, no edema Skin: no rash, no subcutaneous crepitation, no decubitus Psych: appropriate patient  Labs on Admission:  Recent Labs    03/26/23 2016 03/26/23 2117  NA 134*  --   K 2.7*  --   CL 91*  --   CO2 28  --   GLUCOSE 199*  --   BUN 19  --   CREATININE 0.99  --   CALCIUM 8.8*  --   MG  --  1.9    Recent Labs    03/26/23 2016  WBC 14.9*  NEUTROABS 12.3*  HGB 11.2*  HCT 35.0*  MCV 82.5  PLT 234    Micro Results: Recent Results (from the past 240 hour(s))  Urine culture     Status: None   Collection Time: 03/17/23  5:02 PM   Specimen: Urine  Result Value Ref Range Status   MICRO NUMBER: [id number]  Final   SPECIMEN QUALITY: Adequate  Final   Sample Source URINE  Final   STATUS: FINAL  Final   Result:   Final    Less than 10,000 CFU/mL of single Gram negative organism isolated. No further testing will be performed. If clinically indicated, recollection using a method to minimize contamination, with prompt transfer to Urine Culture Transport Tube, is recommended.  Resp panel by RT-PCR (RSV, Flu A&B, Covid) Anterior Nasal Swab     Status: None   Collection Time: 03/26/23  8:16 PM   Specimen: Anterior Nasal Swab  Result Value Ref Range Status   SARS Coronavirus 2 by RT PCR NEGATIVE NEGATIVE Final    Comment: (NOTE) SARS-CoV-2 target nucleic acids are NOT DETECTED.  The SARS-CoV-2 RNA is generally detectable in upper respiratory specimens during the  acute phase of infection. The lowest concentration of SARS-CoV-2 viral copies this assay can detect is 138 copies/mL. A negative result does not preclude SARS-Cov-2 infection and should not be used as the sole basis for treatment or other patient management  decisions. A negative result may occur with  improper specimen collection/handling, submission of specimen other than nasopharyngeal swab, presence of viral mutation(s) within the areas targeted by this assay, and inadequate number of viral copies(<138 copies/mL). A negative result must be combined with clinical observations, patient history, and epidemiological information. The expected result is Negative.  Fact Sheet for Patients:  BloggerCourse.com  Fact Sheet for Healthcare Providers:  SeriousBroker.it  This test is no t yet approved or cleared by the [place] FDA and  has been authorized for detection and/or diagnosis of SARS-CoV-2 by FDA under an Emergency Use Authorization (EUA). This EUA will remain  in effect (meaning this test can be used) for the duration of the COVID-19 declaration under Section 564(b)(1) of the Act, 21 U.S.C.section 360bbb-3(b)(1), unless the authorization is terminated  or revoked sooner.       Influenza A by PCR NEGATIVE NEGATIVE Final   Influenza B by PCR NEGATIVE NEGATIVE Final    Comment: (NOTE) The Xpert Xpress SARS-CoV-2/FLU/RSV plus assay is intended as an aid in the diagnosis of influenza from Nasopharyngeal swab specimens and should not be used as a sole basis for treatment. Nasal washings and aspirates are unacceptable for Xpert Xpress SARS-CoV-2/FLU/RSV testing.  Fact Sheet for Patients: BloggerCourse.com  Fact Sheet for Healthcare Providers: SeriousBroker.it  This test is not yet approved or cleared by the [place] FDA and has been authorized for detection and/or diagnosis of SARS-CoV-2 by FDA under an Emergency Use Authorization (EUA). This EUA will remain in effect (meaning this test can be used) for the duration of the COVID-19 declaration under Section 564(b)(1) of the Act, 21 U.S.C. section 360bbb-3(b)(1), unless the  authorization is terminated or revoked.     Resp Syncytial Virus by PCR NEGATIVE NEGATIVE Final    Comment: (NOTE) Fact Sheet for Patients: BloggerCourse.com  Fact Sheet for Healthcare Providers: SeriousBroker.it  This test is not yet approved or cleared by the [place] FDA and has been authorized for detection and/or diagnosis of SARS-CoV-2 by FDA under an Emergency Use Authorization (EUA). This EUA will remain in effect (meaning this test can be used) for the duration of the COVID-19 declaration under Section 564(b)(1) of the Act, 21 U.S.C. section 360bbb-3(b)(1), unless the authorization is terminated or revoked.  Performed at [facility], [address] [place], [place] [address]      Radiological Exams on Admission: DG Chest Portable 1 View  Result Date: 03/26/2023 CLINICAL DATA:  Cough for 4 days EXAM: PORTABLE CHEST 1 VIEW COMPARISON:  02/22/2023 FINDINGS: Left-sided pacing device. Cardiomegaly with probable small pleural effusions and hazy basilar atelectasis or infiltrates. Aortic atherosclerosis IMPRESSION: Cardiomegaly with probable small pleural effusions and hazy basilar atelectasis or infiltrates. Electronically Signed   By: Jasmine Pang M.D.   On: 03/26/2023 20:58    Assessment/Plan Present on Admission:  Acute exacerbation COPD //  Pneumonia //  lactic acidosis //  severe sepsis  Chronic respiratory failure with hypoxia (HCC) -COPD order set/pneumonia order set initiated -Oxygen keep sats greater than 88% -Scheduled and as needed nebulizers ordered -Continue IV Solu-Medrol -Mucinex DM scheduled, Hycodan as needed.  (Patient states Jerilynn Som has not been working) -Facilities manager ordered -Additional IV bolus ordered.  Elevated lactic level may  also be due to dehydration and infection.  Follow lactic acid level curve until normalized.   -Respiratory board -COVID/flu/RSV  panel  negative -Procalcitonin level ordered   Hypokalemia  -Repleting IV and p.o. -BMP in a.m.   Prolonged QT -539 -Repleted potassium.  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  Hold Demadex and metolazone until potassium normalizes.  Hydrating with elevated lactic acid.  Careful for fluid overload, patient's EF was 35 to 40% -Daily potassium continued -BMP in a.m. -Daily weights  OSA//  BOOP -on BiPaP reordered   CAD //  HTN (hypertension) -Continue losartan   Colovaginal fistula -Per gastroenterology  Gerrianne Aydelott 03/27/2023, 12:14 AM

## 2023-03-27 NOTE — Progress Notes (Signed)
Care started prior to midnight in the emergency room and patient was admitted early this morning after midnight by Dr. Gery Pray and I am in current agreement with her assessment and plan.  Additional changes to plan of care been made accordingly.  The patient is a 77 year old Caucasian female with past medical history significant for problem 2 OSA, BOOP who is known candidate for stenting, COPD, chronic respiratory failure on 5 L, heart failure with reduced ejection fraction, atrial fibrillation status post AVN ablation and on anticoagulation, hypertension and gout, colon vesicular fistula as well as other comorbidities who developed a dry rhonchorous hacking nonproductive cough as well as developing fevers wheezing and a Tmax of 100.  She states that she is diaphoretic and started soaking her sheets and clothing.  She is seen by her cardiologist on Wednesday who told her to take metolazone 30 minutes later and take her to an outside and she was told to do this 2 days neuro.  This was because they thought she was fluid overloaded and patient has been followed by the advanced heart failure clinic.  In the ED she was noted to have a Tmax of 102.5 and she had some wheezing on presentation.  Blood cultures x 2 were collected, she was initiated on Solu-Medrol, Rocephin and IV doxycycline and she is being admitted for acute exacerbation of COPD with subsequent pneumonia and severe sepsis.  She is getting treated and is on antibiotics with ceftriaxone and doxycycline and we will check a respiratory virus panel.  Lactic acid level still elevated but slowly improving.  Will need to monitor respiratory status carefully and repeat chest x-ray and imaging in the a.m. if necessary discussed with pulmonary.

## 2023-03-28 ENCOUNTER — Inpatient Hospital Stay (HOSPITAL_COMMUNITY): Payer: Medicare Other

## 2023-03-28 DIAGNOSIS — I480 Paroxysmal atrial fibrillation: Secondary | ICD-10-CM | POA: Diagnosis not present

## 2023-03-28 DIAGNOSIS — Z7901 Long term (current) use of anticoagulants: Secondary | ICD-10-CM

## 2023-03-28 DIAGNOSIS — I5022 Chronic systolic (congestive) heart failure: Secondary | ICD-10-CM

## 2023-03-28 DIAGNOSIS — M545 Low back pain, unspecified: Secondary | ICD-10-CM

## 2023-03-28 DIAGNOSIS — Z950 Presence of cardiac pacemaker: Secondary | ICD-10-CM | POA: Diagnosis not present

## 2023-03-28 DIAGNOSIS — E785 Hyperlipidemia, unspecified: Secondary | ICD-10-CM

## 2023-03-28 DIAGNOSIS — E039 Hypothyroidism, unspecified: Secondary | ICD-10-CM | POA: Diagnosis not present

## 2023-03-28 DIAGNOSIS — D6869 Other thrombophilia: Secondary | ICD-10-CM

## 2023-03-28 DIAGNOSIS — J441 Chronic obstructive pulmonary disease with (acute) exacerbation: Secondary | ICD-10-CM

## 2023-03-28 DIAGNOSIS — N824 Other female intestinal-genital tract fistulae: Secondary | ICD-10-CM

## 2023-03-28 DIAGNOSIS — I4819 Other persistent atrial fibrillation: Secondary | ICD-10-CM

## 2023-03-28 DIAGNOSIS — G8929 Other chronic pain: Secondary | ICD-10-CM

## 2023-03-28 LAB — COMPREHENSIVE METABOLIC PANEL
ALT: 11 U/L (ref 0–44)
AST: 16 U/L (ref 15–41)
Albumin: 3.2 g/dL — ABNORMAL LOW (ref 3.5–5.0)
Alkaline Phosphatase: 48 U/L (ref 38–126)
Anion gap: 11 (ref 5–15)
BUN: 25 mg/dL — ABNORMAL HIGH (ref 8–23)
CO2: 23 mmol/L (ref 22–32)
Calcium: 9 mg/dL (ref 8.9–10.3)
Chloride: 100 mmol/L (ref 98–111)
Creatinine, Ser: 0.68 mg/dL (ref 0.44–1.00)
GFR, Estimated: >60 mL/min (ref >60)
Glucose, Bld: 198 mg/dL — ABNORMAL HIGH (ref 70–99)
Potassium: 4 mmol/L (ref 3.5–5.1)
Sodium: 134 mmol/L — ABNORMAL LOW (ref 135–145)
Total Bilirubin: 0.5 mg/dL (ref 0.3–1.2)
Total Protein: 6 g/dL — ABNORMAL LOW (ref 6.5–8.1)

## 2023-03-28 LAB — CBC WITH DIFFERENTIAL/PLATELET
Abs Immature Granulocytes: 0.34 10*3/uL — ABNORMAL HIGH (ref 0.00–0.07)
Basophils Absolute: 0 10*3/uL (ref 0.0–0.1)
Basophils Relative: 0 %
Eosinophils Absolute: 0 10*3/uL (ref 0.0–0.5)
Eosinophils Relative: 0 %
HCT: 32.1 % — ABNORMAL LOW (ref 36.0–46.0)
Hemoglobin: 9.9 g/dL — ABNORMAL LOW (ref 12.0–15.0)
Immature Granulocytes: 2 %
Lymphocytes Relative: 3 %
Lymphs Abs: 0.5 10*3/uL — ABNORMAL LOW (ref 0.7–4.0)
MCH: 26.8 pg (ref 26.0–34.0)
MCHC: 30.8 g/dL (ref 30.0–36.0)
MCV: 87 fL (ref 80.0–100.0)
Monocytes Absolute: 0.4 10*3/uL (ref 0.1–1.0)
Monocytes Relative: 3 %
Neutro Abs: 16.1 10*3/uL — ABNORMAL HIGH (ref 1.7–7.7)
Neutrophils Relative %: 92 %
Platelets: 217 10*3/uL (ref 150–400)
RBC: 3.69 MIL/uL — ABNORMAL LOW (ref 3.87–5.11)
RDW: 14.1 % (ref 11.5–15.5)
WBC: 17.4 10*3/uL — ABNORMAL HIGH (ref 4.0–10.5)
nRBC: 0 % (ref 0.0–0.2)

## 2023-03-28 LAB — RETICULOCYTES
Immature Retic Fract: 18.3 % — ABNORMAL HIGH (ref 2.3–15.9)
RBC.: 3.66 MIL/uL — ABNORMAL LOW (ref 3.87–5.11)
Retic Count, Absolute: 88.9 10*3/uL (ref 19.0–186.0)
Retic Ct Pct: 2.4 % (ref 0.4–3.1)

## 2023-03-28 LAB — LACTIC ACID, PLASMA
Lactic Acid, Venous: 2.9 mmol/L (ref 0.5–1.9)
Lactic Acid, Venous: 3.7 mmol/L (ref 0.5–1.9)
Lactic Acid, Venous: 3.6 mmol/L (ref 0.5–1.9)
Lactic Acid, Venous: 2.2 mmol/L (ref 0.5–1.9)

## 2023-03-28 LAB — FERRITIN: Ferritin: 79 ng/mL (ref 11–307)

## 2023-03-28 LAB — FOLATE: Folate: 6.7 ng/mL (ref >5.9)

## 2023-03-28 LAB — HEMOGLOBIN AND HEMATOCRIT, BLOOD
HCT: 33.4 % — ABNORMAL LOW (ref 36.0–46.0)
Hemoglobin: 10.2 g/dL — ABNORMAL LOW (ref 12.0–15.0)

## 2023-03-28 LAB — IRON AND TIBC
Iron: 28 ug/dL (ref 28–170)
Saturation Ratios: 9 % — ABNORMAL LOW (ref 10.4–31.8)
TIBC: 307 ug/dL (ref 250–450)
UIBC: 279 ug/dL

## 2023-03-28 LAB — GLUCOSE, CAPILLARY
Glucose-Capillary: 145 mg/dL — ABNORMAL HIGH (ref 70–99)
Glucose-Capillary: 142 mg/dL — ABNORMAL HIGH (ref 70–99)
Glucose-Capillary: 157 mg/dL — ABNORMAL HIGH (ref 70–99)
Glucose-Capillary: 141 mg/dL — ABNORMAL HIGH (ref 70–99)

## 2023-03-28 LAB — VITAMIN B12: Vitamin B-12: 535 pg/mL (ref 180–914)

## 2023-03-28 LAB — PHOSPHORUS: Phosphorus: 2 mg/dL — ABNORMAL LOW (ref 2.5–4.6)

## 2023-03-28 LAB — MAGNESIUM: Magnesium: 2.4 mg/dL (ref 1.7–2.4)

## 2023-03-28 MED ORDER — METRONIDAZOLE 500 MG/100ML IV SOLN
500.0000 mg | Freq: Two times a day (BID) | INTRAVENOUS | Status: DC
  Administered 2023-03-28 – 2023-04-02 (×11): 500 mg via INTRAVENOUS
  Filled 2023-03-28 (×11): qty 100

## 2023-03-28 MED ORDER — MORPHINE SULFATE (PF) 2 MG/ML IV SOLN
2.0000 mg | INTRAVENOUS | Status: DC | PRN
Start: 2023-03-28 — End: 2023-04-02
  Administered 2023-03-29: 2 mg via INTRAVENOUS
  Filled 2023-03-28: qty 1

## 2023-03-28 MED ORDER — METHYLPREDNISOLONE SODIUM SUCC 40 MG IJ SOLR
40.0000 mg | Freq: Two times a day (BID) | INTRAMUSCULAR | Status: DC
  Administered 2023-03-28 – 2023-04-02 (×10): 40 mg via INTRAVENOUS
  Filled 2023-03-28 (×10): qty 1

## 2023-03-28 MED ORDER — DOXYCYCLINE HYCLATE 100 MG PO TABS
100.0000 mg | ORAL_TABLET | Freq: Two times a day (BID) | ORAL | Status: DC
  Administered 2023-03-28 – 2023-04-04 (×15): 100 mg via ORAL
  Filled 2023-03-28 (×15): qty 1

## 2023-03-28 MED ORDER — WITCH HAZEL-GLYCERIN EX PADS
MEDICATED_PAD | CUTANEOUS | Status: DC | PRN
  Filled 2023-03-28: qty 100

## 2023-03-28 MED ORDER — IOHEXOL 9 MG/ML PO SOLN: 500.0000 mL | ORAL | Status: AC

## 2023-03-28 MED ORDER — MORPHINE SULFATE (PF) 2 MG/ML IV SOLN
2.0000 mg | Freq: Once | INTRAVENOUS | Status: AC
  Administered 2023-03-28: 2 mg via INTRAVENOUS
  Filled 2023-03-28: qty 1

## 2023-03-28 MED ORDER — SODIUM CHLORIDE 0.9 % IV SOLN
2.0000 g | INTRAVENOUS | Status: DC
  Administered 2023-03-28 – 2023-04-02 (×6): 2 g via INTRAVENOUS
  Filled 2023-03-28 (×6): qty 20

## 2023-03-28 MED ORDER — IOHEXOL 9 MG/ML PO SOLN
ORAL | Status: AC
  Filled 2023-03-28: qty 1000

## 2023-03-28 NOTE — Plan of Care (Signed)
  Problem: Clinical Measurements: Goal: Ability to maintain clinical measurements within normal limits will improve Outcome: Progressing Goal: Will remain free from infection Outcome: Progressing Goal: Diagnostic test results will improve Outcome: Progressing Goal: Respiratory complications will improve Outcome: Progressing Goal: Cardiovascular complication will be avoided Outcome: Progressing   Problem: Activity: Goal: Risk for activity intolerance will decrease Outcome: Progressing   Problem: Nutrition: Goal: Adequate nutrition will be maintained Outcome: Progressing   Problem: Elimination: Goal: Will not experience complications related to bowel motility Outcome: Progressing Goal: Will not experience complications related to urinary retention Outcome: Progressing   Problem: Pain Managment: Goal: General experience of comfort will improve Outcome: Progressing   Problem: Safety: Goal: Ability to remain free from injury will improve Outcome: Progressing   Problem: Skin Integrity: Goal: Risk for impaired skin integrity will decrease Outcome: Progressing   Problem: Skin Integrity: Goal: Risk for impaired skin integrity will decrease Outcome: Progressing

## 2023-03-28 NOTE — Plan of Care (Signed)

## 2023-03-28 NOTE — Progress Notes (Signed)
PROGRESS NOTE    Kathleen [name]  ZOX:[id number] DOB: [birth year] DOA: 03/26/2023 PCP: [name], MD   Brief Narrative:  HPI per Dr. Gery Pray on 03/27/23  This is a 77 year old female with past medical history of OSA (on BiPAP), BOOP (noncandidate for stenting), COPD, chronic respiratory failure 5L, HFrEF, A. fib (s/p AVN ablation, BiV, on anticoagulation), HTN, gout, and colovesical fistula.  On Tuesday and Wednesday patient developed a dry, rhonchorous, racking nonproductive cough.  She developed wheezing, on and off fevers, Tmax 100 degrees at home.  She is a night she was diaphoretic, soaking nurse clothing and sheets.  Per patient on Wednesday she was called by cardiologist and told to take her Metalazone dose, 30 min later take her torsemide.  She was to have repeated this 2 days.  This was called based on I thought she was fluid overloaded.  The patient is followed by the [facility] and per patient they are able to remotely evaluate her fluid status. (Telephone conversation cardiologist NP noted on the 11th, without directions for additional diuretic usage).  Her son took her to [facility] ER.   In the ER patient Tmax 102.5, vitals otherwise stable.  Patient wheezing on presentation.  Potassium 2.7, magnesium normal.  BNP 48.5, lactic acid 2.7 ==> 6.1.  CXR cardiomegaly with hazy bibasilar atelectasis or infiltrates. Blood cultures x 2 collected, Solu-Medrol, Rocephin, IV doxycycline, Tylenol, 2 L NS, 40 meq K given in the ER.  She is accepted in transfer.  **Interim History  SHe developed abdominal pain on left side but she does have acute diverticulitis noted on her most recent scan so we will added Flagyl.  She continues to have a significant cough and have added doxycycline.  We are ordering pulmonary toileting and lactic acid level was elevated but is trending down now.  Will continue antibiotic coverage currently.  Assessment and Plan:  Acute exacerbation of COPD wih  Pneumonia  Lactic acidosis  Severe sepsis in the setting of Acute Diverticulitis  Chronic respiratory failure with hypoxia (HCC) -COPD order set/pneumonia order set initiated -Oxygen keep sats greater than 88% SpO2: 97 % O2 Flow Rate (L/min): 5 L/min FiO2 (%): 40 % -Scheduled and as needed nebulizers ordered -Continue IV Solu-Medrol and place on 40 mg q12h -Mucinex DM scheduled, Hycodan as needed.  (Patient states Jerilynn Som has not been working) -Facilities manager ordered along with Flutter Valve -WBC and Lactic Acid Trend: Recent Labs  Lab 03/17/23 1003 03/26/23 2016 03/27/23 0608 03/27/23 1418 03/28/23 0015  WBC 11.6* 14.9* 12.2* 16.0* 17.4*   Recent Labs  Lab 03/27/23 1023 03/27/23 1803 03/27/23 2026 03/28/23 0015 03/28/23 0614 03/28/23 1632 03/28/23 1826  LATICACIDVEN 4.6* 3.6* 3.9* 2.9* 3.7* 3.6* 2.2*  -Additional IV bolus ordered and will stop IVF -Elevated lactic level may also be due to dehydration and infection.  -Follow lactic acid level curve until normalized.   -COVID/flu/RSV panel negative and Respiratory Virus Panel Negative -Procalcitonin level was 0.14 -Repeat CXR done and showed " Cardiomegaly and mild pulmonary vascular congestion without frank edema. Aortic atherosclerosis." -Will consult Pulmonary int he AM   Hypokalemia -Patient's K+ Level Trend: Recent Labs  Lab 03/05/23 1432 03/17/23 1003 03/22/23 1050 03/26/23 2016 03/27/23 0608 03/27/23 1418 03/28/23 0015  K 3.1* 3.3* 4.0 2.7* 3.2* 3.7 4.0  -Continue to Monitor and Replete as Necessary -Repeat CMP in the AM   Prolonged QT -Was 539 -Repleted potassium.  Repeat EKG once potassium normalized -Avoid QT prolongation agents  Hypercoagulable state due to persistent atrial fibrillation (HCC) -On Tikosyn and Xarelto, continued -Will keep magnesium and potassium midrange   Chronic systolic heart failure (HCC) -BNP 48.  Hold Demadex and metolazone until potassium normalizes.   Hydrating with elevated lactic acid.  Careful for fluid overload, patient's EF was 35 to 40% -Daily potassium continued -Strict I's and O's and daily weights  Intake/Output Summary (Last 24 hours) at 03/28/2023 2051 Last data filed at 03/28/2023 1800 Gross per 24 hour  Intake 2448.19 ml  Output --  Net 2448.19 ml  -Continue to monitor for signs and symptoms of volume overload and repeat chest x-ray in a.m.   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 Persistent acute sigmoid diverticulitis is identified as well. No discrete abscess is noted. Stable 5 mm nodule in the right lower lobe. No follow-up needed if patient is low-risk.  Non-contrast chest CT can be considered in 12 months if patient is high-risk."  Lung Nodule -Stable at 5 mm and Needs to be followed in the outpatient setting  Normocytic Anemia -Hgb/Hct Trend: Recent Labs  Lab 03/17/23 1003 03/26/23 2016 03/27/23 0608 03/27/23 1418 03/28/23 0015  HGB 11.6* 11.2* 9.7* 10.1* 9.9*  HCT 35.5* 35.0* 31.5* 32.3* 32.1*  MCV 82.2 82.5 86.5 85.7 87.0  -Check Anemia Panel in the AM -Continue to Monitor for S/Sx of Bleeding; No overt bleeding noted -Repeat CBC in the AM  Hypoalbuminemia -Patient's Albumin Trend: Recent Labs  Lab 03/27/23 0835 03/27/23 1418 03/28/23 0015  ALBUMIN 3.1* 3.2* 3.2*  -Continue to Monitor and Trend and repeat CMP in the AM  Obesity -Complicates overall prognosis and care -Estimated body mass index is 38.14 kg/m as calculated from the following:   Height as of this encounter: 5' (1.524 m).   Weight as of this encounter: 88.6 kg.  -Weight Loss and Dietary Counseling given   DVT prophylaxis:  rivaroxaban (XARELTO) tablet 20 mg    Code Status: Prior Family Communication:  Discussed with the patient's son and brother and other family members at bedside  Disposition Plan:  Level of care: Telemetry Status is: Inpatient Remains inpatient appropriate because: Needs further clinical improvement   Consultants:  None  Procedures:  As delineated as above  Antimicrobials:  Anti-infectives (From admission, onward)    Start     Dose/Rate Route Frequency Ordered Stop   03/28/23 1515  doxycycline (VIBRA-TABS) tablet 100 mg        100 mg Oral Every 12 hours 03/28/23 1501     03/28/23 1000  metroNIDAZOLE (FLAGYL) IVPB 500 mg        500 mg 100 mL/hr over 60 Minutes Intravenous Every 12 hours 03/28/23 0911     03/28/23 1000  cefTRIAXone (ROCEPHIN) 2 g in sodium chloride 0.9 % 100 mL IVPB        2 g 200 mL/hr over 30 Minutes Intravenous Every 24 hours 03/28/23 0942     03/28/23 0100  azithromycin (ZITHROMAX) tablet 500 mg  Status:  Discontinued       Placed in "Followed by" Linked Group   500 mg Oral Daily 03/27/23 0007 03/27/23 0059   03/27/23 1300  doxycycline (VIBRAMYCIN) 100 mg in sodium chloride 0.9 % 250 mL IVPB  Status:  Discontinued        100 mg 125 mL/hr  over 120 Minutes Intravenous Every 12 hours 03/27/23 0100 03/28/23 1501   03/27/23 1000  cefTRIAXone (ROCEPHIN) 1 g in sodium chloride 0.9 % 100 mL IVPB  Status:  Discontinued        1 g 200 mL/hr over 30 Minutes Intravenous Every 24 hours 03/27/23 0059 03/28/23 0942   03/27/23 0100  azithromycin (ZITHROMAX) 500 mg in sodium chloride 0.9 % 250 mL IVPB  Status:  Discontinued       Placed in "Followed by" Linked Group   500 mg 250 mL/hr over 60 Minutes Intravenous Every 24 hours 03/27/23 0007 03/27/23 0059   03/26/23 2245  doxycycline (VIBRAMYCIN) 100 mg in sodium chloride 0.9 % 250 mL IVPB        100 mg 125 mL/hr over 120 Minutes Intravenous  Once 03/26/23 2231 03/27/23 0236   03/26/23 2115  cefTRIAXone (ROCEPHIN) 1 g in sodium chloride 0.9 % 100 mL IVPB        1 g 200 mL/hr over 30 Minutes  Intravenous  Once 03/26/23 2113 03/26/23 2211   03/26/23 2115  azithromycin (ZITHROMAX) 500 mg in sodium chloride 0.9 % 250 mL IVPB  Status:  Discontinued        500 mg 250 mL/hr over 60 Minutes Intravenous  Once 03/26/23 2113 03/26/23 2231       Subjective: Examined at bedside and thinks her respiratory status is not much better but patient still thinks she is doing better today.  She continues to have a wet sounding cough.  Also had some abdominal pain yesterday and bloody bowel movement but none since then and abdominal pain is improved.  No other concerns or complaints this time.  Objective: Vitals:   03/28/23 1317 03/28/23 1443 03/28/23 1943 03/28/23 2039  BP: (!) 132/44   (!) 156/55  Pulse: 77   84  Resp: 18   16  Temp: 98.2 F (36.8 C)   97.9 F (36.6 C)  TempSrc: Oral   Oral  SpO2: 99% 96% 95% 97%  Weight:      Height:        Intake/Output Summary (Last 24 hours) at 03/28/2023 2055 Last data filed at 03/28/2023 1800 Gross per 24 hour  Intake 2448.19 ml  Output --  Net 2448.19 ml   Filed Weights   03/27/23 0445 03/28/23 0557 03/28/23 0700  Weight: 85.5 kg 85.5 kg 88.6 kg   Examination: Physical Exam:  Constitutional: WN/WD obese Caucasian female in no acute distress Respiratory: Diminished to auscultation bilaterally with some coarse breath sounds and has some crackles and some rhonchi noted. has a normal respiratory effort and is not tachypneic but is wearing supplemental oxygen via nasal cannula Cardiovascular: RRR, no murmurs / rubs / gallops. S1 and S2 auscultated.  Abdomen: Soft, mildly-tender, distended secondary to body habitus. Bowel sounds positive.  GU: Deferred. Musculoskeletal: No clubbing / cyanosis of digits/nails. No joint deformity upper and lower extremities.  Skin: No rashes, lesions, ulcers limited skin evaluation. No induration; Warm and dry.  Neurologic: CN 2-12 grossly intact with no focal deficits. Romberg sign and cerebellar reflexes not  assessed.  Psychiatric: Normal judgment and insight. Alert and oriented x 3. Normal mood and appropriate affect.   Data Reviewed: I have personally reviewed following labs and imaging studies  CBC: Recent Labs  Lab 03/26/23 2016 03/27/23 0608 03/27/23 1418 03/28/23 0015  WBC 14.9* 12.2* 16.0* 17.4*  NEUTROABS 12.3* 11.6* 15.0* 16.1*  HGB 11.2* 9.7* 10.1* 9.9*  HCT 35.0* 31.5* 32.3* 32.1*  MCV 82.5 86.5 85.7 87.0  PLT 234 200 210 217   Basic Metabolic Panel: Recent Labs  Lab 03/22/23 1050 03/26/23 2016 03/26/23 2117 03/27/23 0608 03/27/23 0835 03/27/23 1418 03/28/23 0015  NA 138 134*  --  134*  --  136 134*  K 4.0 2.7*  --  3.2*  --  3.7 4.0  CL 105 91*  --  96*  --  102 100  CO2 24 28  --  23  --  23 23  GLUCOSE 145* 199*  --  247*  --  137* 198*  BUN 7* 19  --  23  --  20 25*  CREATININE 0.71 0.99  --  0.93  --  0.65 0.68  CALCIUM 8.6* 8.8*  --  8.4*  --  8.5* 9.0  MG  --   --  1.9  --  1.7 1.7 2.4  PHOS  --   --   --   --  2.3* 2.4* 2.0*   GFR: Estimated Creatinine Clearance: 59.2 mL/min (by C-G formula based on SCr of 0.68 mg/dL). Liver Function Tests: Recent Labs  Lab 03/27/23 0835 03/27/23 1418 03/28/23 0015  AST 16 16 16   ALT 10 10 11   ALKPHOS 50 51 48  BILITOT 0.8 0.5 0.5  PROT 6.2* 6.2* 6.0*  ALBUMIN 3.1* 3.2* 3.2*   No results for input(s): "LIPASE", "AMYLASE" in the last 168 hours. No results for input(s): "AMMONIA" in the last 168 hours. Coagulation Profile: No results for input(s): "INR", "PROTIME" in the last 168 hours. Cardiac Enzymes: No results for input(s): "CKTOTAL", "CKMB", "CKMBINDEX", "TROPONINI" in the last 168 hours. BNP (last 3 results) No results for input(s): "PROBNP" in the last 8760 hours. HbA1C: No results for input(s): "HGBA1C" in the last 72 hours. CBG: Recent Labs  Lab 03/27/23 2119 03/28/23 0812 03/28/23 1206 03/28/23 1638 03/28/23 2041  GLUCAP 139* 145* 142* 157* 141*   Lipid Profile: No results for  input(s): "CHOL", "HDL", "LDLCALC", "TRIG", "CHOLHDL", "LDLDIRECT" in the last 72 hours. Thyroid Function Tests: No results for input(s): "TSH", "T4TOTAL", "FREET4", "T3FREE", "THYROIDAB" in the last 72 hours. Anemia Panel: Recent Labs    03/28/23 0015  VITAMINB12 535  FOLATE 6.7  FERRITIN 79  TIBC 307  IRON 28  RETICCTPCT 2.4   Sepsis Labs: Recent Labs  Lab 03/27/23 0835 03/27/23 1023 03/28/23 0015 03/28/23 0614 03/28/23 1632 03/28/23 1826  PROCALCITON 0.14  --   --   --   --   --   LATICACIDVEN 3.7*   < > 2.9* 3.7* 3.6* 2.2*   < > = values in this interval not displayed.    Recent Results (from the past 240 hour(s))  Resp panel by RT-PCR (RSV, Flu A&B, Covid) Anterior Nasal Swab     Status: None   Collection Time: 03/26/23  8:16 PM   Specimen: Anterior Nasal Swab  Result Value Ref Range Status   SARS Coronavirus 2 by RT PCR NEGATIVE NEGATIVE Final    Comment: (NOTE) SARS-CoV-2 target nucleic acids are NOT DETECTED.  The SARS-CoV-2 RNA is generally detectable in upper respiratory specimens during the acute phase of infection. The lowest concentration of SARS-CoV-2 viral copies this assay can detect is 138 copies/mL. A negative result does not preclude SARS-Cov-2 infection and should not be used as the sole basis for treatment or other patient management decisions. A negative result may occur with  improper specimen collection/handling, submission of specimen other than nasopharyngeal swab, presence of viral mutation(s) within  the areas targeted by this assay, and inadequate number of viral copies(<138 copies/mL). A negative result must be combined with clinical observations, patient history, and epidemiological information. The expected result is Negative.  Fact Sheet for Patients:  BloggerCourse.com  Fact Sheet for Healthcare Providers:  SeriousBroker.it  This test is no t yet approved or cleared by the [place] FDA and  has been authorized for detection and/or diagnosis of SARS-CoV-2 by FDA under an Emergency Use Authorization (EUA). This EUA will remain  in effect (meaning this test can be used) for the duration of the COVID-19 declaration under Section 564(b)(1) of the Act, 21 U.S.C.section 360bbb-3(b)(1), unless the authorization is terminated  or revoked sooner.       Influenza A by PCR NEGATIVE NEGATIVE Final   Influenza B by PCR NEGATIVE NEGATIVE Final    Comment: (NOTE) The Xpert Xpress SARS-CoV-2/FLU/RSV plus assay is intended as an aid in the diagnosis of influenza from Nasopharyngeal swab specimens and should not be used as a sole basis for treatment. Nasal washings and aspirates are unacceptable for Xpert Xpress SARS-CoV-2/FLU/RSV testing.  Fact Sheet for Patients: BloggerCourse.com  Fact Sheet for Healthcare Providers: SeriousBroker.it  This test is not yet approved or cleared by the [place] FDA and has been authorized for detection and/or diagnosis of SARS-CoV-2 by FDA under an Emergency Use Authorization (EUA). This EUA will remain in effect (meaning this test can be used) for the duration of the COVID-19 declaration under Section 564(b)(1) of the Act, 21 U.S.C. section 360bbb-3(b)(1), unless the authorization is terminated or revoked.     Resp Syncytial Virus by PCR NEGATIVE NEGATIVE Final    Comment: (NOTE) Fact Sheet for Patients: BloggerCourse.com  Fact Sheet for Healthcare Providers: SeriousBroker.it  This test is not yet approved or cleared by the [place] FDA and has been authorized for detection and/or diagnosis of SARS-CoV-2 by FDA under an Emergency Use Authorization (EUA). This EUA will remain in effect (meaning this test can be used) for the duration of the COVID-19 declaration under Section 564(b)(1) of the Act, 21 U.S.C. section  360bbb-3(b)(1), unless the authorization is terminated or revoked.  Performed at [facility], [address] [place], [place] [address]   Blood culture (routine x 2)     Status: None (Preliminary result)   Collection Time: 03/26/23  8:20 PM   Specimen: BLOOD  Result Value Ref Range Status   Specimen Description   Final    BLOOD LEFT ANTECUBITAL Performed at [facility], [address] [place], [place] [address]    Special Requests   Final    BOTTLES DRAWN AEROBIC AND ANAEROBIC Blood Culture results may not be optimal due to an excessive volume of blood received in culture bottles Performed at [facility], [address], [place], [place] [address]    Culture   Final    NO GROWTH < 12 HOURS Performed at [facility], [address] [place], [place] [address]    Report Status PENDING  Incomplete  Blood culture (routine x 2)     Status: None (Preliminary result)   Collection Time: 03/26/23  8:30 PM   Specimen: BLOOD  Result Value Ref Range Status   Specimen Description   Final    BLOOD RIGHT ANTECUBITAL Performed at [facility], [address], [place], [place] [address]    Special Requests   Final    BOTTLES DRAWN AEROBIC AND ANAEROBIC Blood Culture adequate volume Performed at  [facility], [address] [place], [place] [address]    Culture   Final    NO GROWTH < 12 HOURS Performed at [facility], [address], [place], [place] [address]    Report Status PENDING  Incomplete  Respiratory (~20 pathogens) panel by PCR     Status: None   Collection Time: 03/27/23  1:02 PM   Specimen: Nasopharyngeal Swab; Respiratory  Result Value Ref Range Status   Adenovirus NOT DETECTED NOT DETECTED Final   Coronavirus 229E NOT DETECTED NOT DETECTED Final    Comment: (NOTE) The Coronavirus on the Respiratory Panel, DOES NOT test for the novel  Coronavirus (2019  nCoV)    Coronavirus HKU1 NOT DETECTED NOT DETECTED Final   Coronavirus NL63 NOT DETECTED NOT DETECTED Final   Coronavirus OC43 NOT DETECTED NOT DETECTED Final   Metapneumovirus NOT DETECTED NOT DETECTED Final   Rhinovirus / Enterovirus NOT DETECTED NOT DETECTED Final   Influenza A NOT DETECTED NOT DETECTED Final   Influenza B NOT DETECTED NOT DETECTED Final   Parainfluenza Virus 1 NOT DETECTED NOT DETECTED Final   Parainfluenza Virus 2 NOT DETECTED NOT DETECTED Final   Parainfluenza Virus 3 NOT DETECTED NOT DETECTED Final   Parainfluenza Virus 4 NOT DETECTED NOT DETECTED Final   Respiratory Syncytial Virus NOT DETECTED NOT DETECTED Final   Bordetella pertussis NOT DETECTED NOT DETECTED Final   Bordetella Parapertussis NOT DETECTED NOT DETECTED Final   Chlamydophila pneumoniae NOT DETECTED NOT DETECTED Final   Mycoplasma pneumoniae NOT DETECTED NOT DETECTED Final    Comment: Performed at [facility], [address], [place], [place] [address]    Radiology Studies: DG CHEST PORT 1 VIEW  Result Date: 03/28/2023 CLINICAL DATA:  Shortness of breath. EXAM: PORTABLE CHEST 1 VIEW COMPARISON:  One-view chest x-ray 03/27/2023 FINDINGS: The heart is enlarged. Atherosclerotic calcifications are present at the aortic arch. Mild pulmonary vascular congestion is present. No focal airspace disease is present. Pacing wires are stable. Advanced degenerative changes are present in the shoulders, right greater than left. IMPRESSION: 1. Cardiomegaly and mild pulmonary vascular congestion without frank edema. 2. Aortic atherosclerosis. Electronically Signed   By: Marin Roberts M.D.   On: 03/28/2023 10:26   DG CHEST PORT 1 VIEW  Result Date: 03/27/2023 CLINICAL DATA:  Shortness of breath EXAM: PORTABLE CHEST 1 VIEW COMPARISON:  Film from the previous day. FINDINGS: Cardiac shadow is stable. Pacing device is again seen and stable. The lungs are hyperinflated but clear. No focal infiltrate or  effusion is noted. No bony abnormality is seen. IMPRESSION: Hyperinflation without focal infiltrate. Electronically Signed   By: Alcide Clever M.D.   On: 03/27/2023 17:09    Scheduled Meds:  albuterol  2.5 mg Nebulization Q6H   dextromethorphan-guaiFENesin  1 tablet Oral BID   dofetilide  250 mcg Oral BID   doxycycline  100 mg Oral Q12H   fluticasone furoate-vilanterol  1 puff Inhalation Daily   gabapentin  200 mg Oral QHS   insulin aspart  0-15 Units Subcutaneous TID WC   insulin aspart  0-5 Units Subcutaneous QHS   losartan  12.5 mg Oral Daily   methylPREDNISolone (SOLU-MEDROL) injection  40 mg Intravenous Q12H   potassium chloride SA  40 mEq Oral Daily   revefenacin  175 mcg Nebulization Daily   rivaroxaban  20 mg Oral Q supper   umeclidinium bromide  1 puff Inhalation Daily   Continuous Infusions:  sodium chloride 75 mL/hr at  03/28/23 0600   cefTRIAXone (ROCEPHIN)  IV 2 g (03/28/23 1137)   metronidazole 500 mg (03/28/23 1420)    LOS: 2 days   Marguerita Merles, DO [facility] Available via Epic secure chat 7am-7pm After these hours, please refer to coverage provider listed on [email or web address] 03/28/2023, 8:55 PM

## 2023-03-28 NOTE — Progress Notes (Signed)
Initial Nutrition Assessment  DOCUMENTATION CODES:   Obesity unspecified  INTERVENTION:  Snack BID   NUTRITION DIAGNOSIS:   Increased nutrient needs related to chronic illness as evidenced by estimated needs.    GOAL:   Patient will meet greater than or equal to 90% of their needs    MONITOR:   PO intake, Supplement acceptance, Weight trends, Labs  REASON FOR ASSESSMENT:   Consult Assessment of nutrition requirement/status  ASSESSMENT:    77 y.o. F, admitted with acquired hypothyroidism,  OSA (on BiPAP), BOOP (noncandidate for stenting), COPD, chronic respiratory failure 5L, HFrEF, A. fib (s/p AVN ablation, BiV, on anticoagulation), HTN, gout, and colovesical fistula. Acute exacerbation of COPD. Treated with antibiotics. No weight los noted.  Patient reports good appetite with no changes. Further states no weight changes. Able to feed self.   Admit weight: 86.6 kg Current weight: 88.6 kg  Weight history; 03/28/23 88.6 kg  03/17/23 86.8 kg  03/05/23 86.8 kg  02/12/23 86.9 kg  02/04/23 88 kg  01/27/23 87.1 kg  01/20/23 86.6 kg  01/19/23 88.6 kg  01/13/23 88.4 kg  12/21/22 86.4 kg      Average Meal Intake:  100% intake x 4 recorded meals  Nutritionally Relevant Medications: Scheduled Meds:  guaiFENesin  1,200 mg Oral BID   insulin aspart  0-15 Units Subcutaneous TID WC   insulin aspart  0-5 Units Subcutaneous QHS   losartan  12.5 mg Oral Daily   potassium chloride SA  40 mEq Oral Daily    Labs Reviewed:  CBG ranges from 137-198 mg/dL over the last 24 hours  NUTRITION - FOCUSED PHYSICAL EXAM:  Deferred  Diet Order:   Diet Order             Diet heart healthy/carb modified Fluid consistency: Thin  Diet effective now                   EDUCATION NEEDS:   No education needs have been identified at this time  Skin:  Skin Assessment: Reviewed RN Assessment  Last BM:  10/20  Height:   Ht Readings from Last 1 Encounters:  03/26/23  5' (1.524 m)    Weight:   Wt Readings from Last 1 Encounters:  03/28/23 88.6 kg    Ideal Body Weight:     BMI:  Body mass index is 38.14 kg/m.  Estimated Nutritional Needs:   Kcal:  1600-1900 kcal/d  Protein:  100-115 g /day  Fluid:  2100-2575 ml/d    Jamelle Haring RDN, LDN Clinical Dietitian  RDN pager # available on Amion

## 2023-03-28 NOTE — Progress Notes (Signed)
Patient Name: TAMZIN WASER           DOB: [birth year]  MRN: [id number]      Admission Date: 03/26/2023  Attending Provider: Merlene Laughter, DO  Primary Diagnosis: <principal problem not specified>   Level of care: Telemetry    CROSS COVER NOTE   Date of Service   03/28/2023   GRETE DELUDE, 77 y.o. female, was admitted on 03/26/2023 for <principal problem not specified>.    HPI/Events of Note   Patient developed sharp severe LLQ abdominal pain (8/10) radiating to left flank. Abdomin obese, soft. Tenderness to palpation in LLQ.  Bowel sounds present in all quadrants.  Some relief with morphine, CT abd/ pelvis ordered     Interventions/ Plan   Morphine prn CT abd/ pelvis        Anthoney Harada, DNP, ACNPC- AG [facility]

## 2023-03-28 NOTE — Hospital Course (Addendum)
HPI per Dr. Gery Pray on 03/27/23  This is a 78 year old female with past medical history of OSA (on BiPAP), BOOP (noncandidate for stenting), COPD, chronic respiratory failure 5L, HFrEF, A. fib (s/p AVN ablation, BiV, on anticoagulation), HTN, gout, and colovesical fistula.  On Tuesday and Wednesday patient developed a dry, rhonchorous, racking nonproductive cough.  She developed wheezing, on and off fevers, Tmax 100 degrees at home.  She is a night she was diaphoretic, soaking nurse clothing and sheets.  Per patient on Wednesday she was called by cardiologist and told to take her Metalazone dose, 30 min later take her torsemide.  She was to have repeated this 2 days.  This was called based on I thought she was fluid overloaded.  The patient is followed by the [facility] and per patient they are able to remotely evaluate her fluid status. (Telephone conversation cardiologist NP noted on the 11th, without directions for additional diuretic usage).  Her son took her to [facility] ER.   In the ER patient Tmax 102.5, vitals otherwise stable.  Patient wheezing on presentation.  Potassium 2.7, magnesium normal.  BNP 48.5, lactic acid 2.7 ==> 6.1.  CXR cardiomegaly with hazy bibasilar atelectasis or infiltrates. Blood cultures x 2 collected, Solu-Medrol, Rocephin, IV doxycycline, Tylenol, 2 L NS, 40 meq K given in the ER.  She is accepted in transfer.  **Interim History  She developed abdominal pain on left side but she does have acute diverticulitis noted on her most recent scan so we will added Flagyl.  She continues to have a significant cough and have added doxycycline.  We are ordering pulmonary toileting and lactic acid level was elevated but is trending down now.  Will continue antibiotic coverage currently.  Have asked GI and pulmonary to evaluate  Assessment and Plan:  Acute exacerbation of COPD wih Pneumonia  Lactic acidosis  Severe sepsis in the setting of Acute Diverticulitis  Acute on  chronic chronic respiratory failure with hypoxia (HCC) -COPD order set/pneumonia order set initiated -Oxygen keep sats greater than 88% SpO2: 97 % O2 Flow Rate (L/min): 5 L/min FiO2 (%): 40 % -Scheduled and as needed nebulizers ordered -Continue IV Solu-Medrol and place on 40 mg q12h for now on Terran Klinke recommends continuing for now -Mucinex DM scheduled, Hycodan as needed.  (Patient states Jerilynn Som has not been working) -Facilities manager ordered along with Flutter Valve -WBC and Lactic Acid Trend: Recent Labs  Lab 03/17/23 1003 03/26/23 2016 03/27/23 0608 03/27/23 1418 03/28/23 0015  WBC 11.6* 14.9* 12.2* 16.0* 17.4*   Recent Labs  Lab 03/27/23 1023 03/27/23 1803 03/27/23 2026 03/28/23 0015 03/28/23 0614 03/28/23 1632 03/28/23 1826  LATICACIDVEN 4.6* 3.6* 3.9* 2.9* 3.7* 3.6* 2.2*  -Additional IV bolus ordered and will stop IVF -Elevated lactic level may also be due to dehydration and infection.  -Follow lactic acid level curve until normalized.   -COVID/flu/RSV panel negative and Respiratory Virus Panel Negative -Procalcitonin level was 0.14 -Repeat CXR done yesterday and showed " Cardiomegaly and mild pulmonary vascular congestion without frank edema. Aortic atherosclerosis" and repeat chest x-ray pending today -Pulmonary consulted and recommending changing Hycodan Intestinex elevating the head of the bed 30 degrees and ensuring pulmonary hygiene.  They have also placed on aspiration precautions and are considering obtaining a chest CT recommend a yearly assessment for the need to diurese -Gastroenterology consulted and she is a very high risk for any endoscopic surgical intervention and cannot undergo colonoscopy because she still has a persistent diverticulitis.;  She is scheduled tentatively for colonoscopy on 05/03/2023 and GI recommending continue antibiotics at this time and they are recommending limiting steroids if able and stopping when okay from a respiratory  standpoint   Hypokalemia -Patient's K+ Level Trend: Recent Labs  Lab 03/05/23 1432 03/17/23 1003 03/22/23 1050 03/26/23 2016 03/27/23 0608 03/27/23 1418 03/28/23 0015  K 3.1* 3.3* 4.0 2.7* 3.2* 3.7 4.0  -Continue to Monitor and Replete as Necessary -Repeat CMP in the AM   Prolonged QT -Was 539 -Repleted potassium.  Repeat EKG once potassium normalized -Avoid QT prolongation agents   Hypercoagulable state due to persistent atrial fibrillation (HCC) -On Tikosyn and Xarelto, continued -Will keep magnesium and potassium midrange -May need to hold her Xarelto given her bloody stools   Chronic systolic heart failure (HCC) -BNP 48.  Hold Demadex and metolazone until potassium normalizes.  Hydrating with elevated lactic acid.  Careful for fluid overload, patient's EF was 35 to 40% -Daily potassium continued -Strict I's and O's and daily weights  Intake/Output Summary (Last 24 hours) at 03/28/2023 2051 Last data filed at 03/28/2023 1800 Gross per 24 hour  Intake 2448.19 ml  Output --  Net 2448.19 ml  -Continue to monitor for signs and symptoms of volume overload and repeat chest x-ray in a.m. 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 Persistent acute sigmoid diverticulitis is identified as well. No discrete abscess is noted. Stable 5 mm nodule in the right lower lobe. No follow-up needed if patient is low-risk.  Non-contrast chest CT can be considered in 12 months if patient is high-risk." -Will need outpatient Colonoscopy   Lung Nodule -Stable at 5 mm and Needs to be followed in the outpatient setting  Normocytic Anemia -Hgb/Hct Trend: Recent Labs  Lab 03/17/23 1003 03/26/23 2016  03/27/23 0608 03/27/23 1418 03/28/23 0015  HGB 11.6* 11.2* 9.7* 10.1* 9.9*  HCT 35.5* 35.0* 31.5* 32.3* 32.1*  MCV 82.2 82.5 86.5 85.7 87.0  -Checked Anemia Panel and showed an iron level of 28, UIBC 279, TIBC 307, saturation ratio 9%, ferritin of 79, folate level 6.7 and vitamin B12 535 -Continue to Monitor for S/Sx of Bleeding had bloody stool this morning so we will check FOBT; Remains on Xarelto but may need to hold  -Will discuss with GI in the morning and trend H&H as every 6 -Repeat CBC in the AM  Hypoalbuminemia -Patient's Albumin Trend: Recent Labs  Lab 03/27/23 0835 03/27/23 1418 03/28/23 0015  ALBUMIN 3.1* 3.2* 3.2*  -Continue to Monitor and Trend and repeat CMP in the AM  Obesity -Complicates overall prognosis and care -Estimated body mass index is 38.14 kg/m as calculated from the following:   Height as of this encounter: 5' (1.524 m).   Weight as of this encounter: 88.6 kg.  -Weight Loss and Dietary Counseling given

## 2023-03-29 ENCOUNTER — Inpatient Hospital Stay (HOSPITAL_COMMUNITY): Payer: Medicare Other

## 2023-03-29 ENCOUNTER — Telehealth (HOSPITAL_COMMUNITY): Payer: Self-pay

## 2023-03-29 DIAGNOSIS — I480 Paroxysmal atrial fibrillation: Secondary | ICD-10-CM | POA: Diagnosis not present

## 2023-03-29 DIAGNOSIS — Z950 Presence of cardiac pacemaker: Secondary | ICD-10-CM | POA: Diagnosis not present

## 2023-03-29 DIAGNOSIS — E039 Hypothyroidism, unspecified: Secondary | ICD-10-CM | POA: Diagnosis not present

## 2023-03-29 DIAGNOSIS — J441 Chronic obstructive pulmonary disease with (acute) exacerbation: Secondary | ICD-10-CM | POA: Diagnosis not present

## 2023-03-29 DIAGNOSIS — N824 Other female intestinal-genital tract fistulae: Secondary | ICD-10-CM | POA: Diagnosis not present

## 2023-03-29 LAB — COMPREHENSIVE METABOLIC PANEL
ALT: 13 U/L (ref 0–44)
AST: 17 U/L (ref 15–41)
Albumin: 3.5 g/dL (ref 3.5–5.0)
Alkaline Phosphatase: 46 U/L (ref 38–126)
Anion gap: 15 (ref 5–15)
BUN: 21 mg/dL (ref 8–23)
CO2: 22 mmol/L (ref 22–32)
Calcium: 10.3 mg/dL (ref 8.9–10.3)
Chloride: 102 mmol/L (ref 98–111)
Creatinine, Ser: 0.7 mg/dL (ref 0.44–1.00)
GFR, Estimated: >60 mL/min (ref >60)
Glucose, Bld: 163 mg/dL — ABNORMAL HIGH (ref 70–99)
Potassium: 4.1 mmol/L (ref 3.5–5.1)
Sodium: 139 mmol/L (ref 135–145)
Total Bilirubin: 0.6 mg/dL (ref 0.3–1.2)
Total Protein: 6.8 g/dL (ref 6.5–8.1)

## 2023-03-29 LAB — GLUCOSE, CAPILLARY
Glucose-Capillary: 159 mg/dL — ABNORMAL HIGH (ref 70–99)
Glucose-Capillary: 125 mg/dL — ABNORMAL HIGH (ref 70–99)
Glucose-Capillary: 206 mg/dL — ABNORMAL HIGH (ref 70–99)
Glucose-Capillary: 138 mg/dL — ABNORMAL HIGH (ref 70–99)

## 2023-03-29 LAB — CBC WITH DIFFERENTIAL/PLATELET
Abs Immature Granulocytes: 0.81 10*3/uL — ABNORMAL HIGH (ref 0.00–0.07)
Basophils Absolute: 0.1 10*3/uL (ref 0.0–0.1)
Basophils Relative: 0 %
Eosinophils Absolute: 0 10*3/uL (ref 0.0–0.5)
Eosinophils Relative: 0 %
HCT: 36.1 % (ref 36.0–46.0)
Hemoglobin: 11 g/dL — ABNORMAL LOW (ref 12.0–15.0)
Immature Granulocytes: 6 %
Lymphocytes Relative: 6 %
Lymphs Abs: 0.9 10*3/uL (ref 0.7–4.0)
MCH: 27 pg (ref 26.0–34.0)
MCHC: 30.5 g/dL (ref 30.0–36.0)
MCV: 88.5 fL (ref 80.0–100.0)
Monocytes Absolute: 0.6 10*3/uL (ref 0.1–1.0)
Monocytes Relative: 4 %
Neutro Abs: 12.3 10*3/uL — ABNORMAL HIGH (ref 1.7–7.7)
Neutrophils Relative %: 84 %
Platelets: 284 10*3/uL (ref 150–400)
RBC: 4.08 MIL/uL (ref 3.87–5.11)
RDW: 14.4 % (ref 11.5–15.5)
WBC: 14.6 10*3/uL — ABNORMAL HIGH (ref 4.0–10.5)
nRBC: 0.1 % (ref 0.0–0.2)

## 2023-03-29 LAB — HEMOGLOBIN AND HEMATOCRIT, BLOOD
HCT: 32.2 % — ABNORMAL LOW (ref 36.0–46.0)
HCT: 33.5 % — ABNORMAL LOW (ref 36.0–46.0)
HCT: 36.6 % (ref 36.0–46.0)
HCT: 35.3 % — ABNORMAL LOW (ref 36.0–46.0)
Hemoglobin: 9.8 g/dL — ABNORMAL LOW (ref 12.0–15.0)
Hemoglobin: 10.6 g/dL — ABNORMAL LOW (ref 12.0–15.0)
Hemoglobin: 11.3 g/dL — ABNORMAL LOW (ref 12.0–15.0)
Hemoglobin: 11.1 g/dL — ABNORMAL LOW (ref 12.0–15.0)

## 2023-03-29 LAB — MAGNESIUM: Magnesium: 2.2 mg/dL (ref 1.7–2.4)

## 2023-03-29 LAB — PHOSPHORUS: Phosphorus: 3.3 mg/dL (ref 2.5–4.6)

## 2023-03-29 MED ORDER — LEVALBUTEROL HCL 0.63 MG/3ML IN NEBU
0.6300 mg | INHALATION_SOLUTION | Freq: Four times a day (QID) | RESPIRATORY_TRACT | Status: DC
  Administered 2023-03-29 (×2): 0.63 mg via RESPIRATORY_TRACT
  Filled 2023-03-29 (×2): qty 3

## 2023-03-29 MED ORDER — LEVALBUTEROL HCL 0.63 MG/3ML IN NEBU
0.6300 mg | INHALATION_SOLUTION | Freq: Three times a day (TID) | RESPIRATORY_TRACT | Status: DC
  Administered 2023-03-30 – 2023-04-04 (×17): 0.63 mg via RESPIRATORY_TRACT
  Filled 2023-03-29 (×18): qty 3

## 2023-03-29 MED ORDER — HYDROCOD POLI-CHLORPHE POLI ER 10-8 MG/5ML PO SUER
5.0000 mL | Freq: Two times a day (BID) | ORAL | Status: DC | PRN
  Administered 2023-03-29 – 2023-04-01 (×6): 5 mL via ORAL
  Filled 2023-03-29 (×6): qty 5

## 2023-03-29 NOTE — Telephone Encounter (Signed)
Received referral for patient. She is admitted presently, will f/u once she gets out.   Kerry Hough, EMT-Paramedic  [phone] 03/29/2023

## 2023-03-29 NOTE — Progress Notes (Signed)
   03/29/23 0100  BiPAP/CPAP/SIPAP  BiPAP/CPAP/SIPAP Pt Type  (Removed by RN and pt states she wants to keep it off tonight. pt is on 5L [place] and doing well no resp distress. RN aware to call RT if needed. Machine remained bedside.)  BiPAP/CPAP /SiPAP Vitals  Resp 18  MEWS Score/Color  MEWS Score 0  MEWS Score Color Green

## 2023-03-29 NOTE — Progress Notes (Signed)
   03/29/23 2248  BiPAP/CPAP/SIPAP  BiPAP/CPAP/SIPAP Pt Type Adult  BiPAP/CPAP/SIPAP DREAMSTATIOND  Mask Type Full face mask  Mask Size Medium  Respiratory Rate 16 breaths/min  IPAP 12 cmH20 (CHANGED TO BIPAP 12/8 PER CHART NOTES / HX)  EPAP 8 cmH2O  Flow Rate 5 lpm  Patient Home Equipment No  Auto Titrate No  CPAP/SIPAP surface wiped down Yes  BiPAP/CPAP /SiPAP Vitals  Pulse Rate 60  SpO2 99 %   Pt stated these settings feel good, like her bipap machine at home.

## 2023-03-29 NOTE — Progress Notes (Signed)
Physical Therapy Treatment Patient Details Name: Kathleen [name] MRN: [id number] DOB: 1945-12-05 Today's Date: 03/29/2023   History of Present Illness The pt is a 77 yo female presenting 10/18 with Acute exacerbation COPD, Pneumonia, lactic acidosis, severe sepsis   Chronic respiratory failure with hypoxia. Pt with admission last month to [facility] with brown stool/discharge from vagina x1 week. 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  August previous admission to [facility] for COPD exacerbation.  PMH includes: COPD on 4-5L O2, asthma, OSA on bipap, afib, CHF.    PT Comments  Pt is progressing toward acute PT goals this session with progression of ambulation distance. Pt ambulated ~252ft total with x3 seated rest breaks due to SOB and supervision for safety. O2 low on 6L via [place] 91% with recovery up to 97% with seated rests. Pt will benefit from continued skilled PT to increase their independence and maximize safety with mobility.      If plan is discharge home, recommend the following: A little help with walking and/or transfers;Help with stairs or ramp for entrance;A little help with bathing/dressing/bathroom   Can travel by private vehicle        Equipment Recommendations  None recommended by PT    Recommendations for Other Services       Precautions / Restrictions Precautions Precautions: Fall Precaution Comments: Keep SpO2 above 88% Restrictions Weight Bearing Restrictions: No     Mobility  Bed Mobility               General bed mobility comments: Pt in doorway ready to work with PT    Transfers Overall transfer level: Needs assistance Equipment used: Rollator (4 wheels) Transfers: Sit to/from Stand Sit to Stand: Supervision           General transfer comment: x3 from rollator with seated rest breaks. PT providing stabilization of rollator, no physical assist required to power up to stand.    Ambulation/Gait Ambulation/Gait assistance: Supervision Gait  Distance (Feet): 200 Feet (x3 seated rest breaks due to SOB) Assistive device: Rollator (4 wheels) Gait Pattern/deviations: Step-through pattern Gait velocity: decreased     General Gait Details: x3 seated rest breaks due to SOB. Supervision for safety. O2 low of 91% during ambulation while on 6L.   Stairs             Wheelchair Mobility     Tilt Bed    Modified Rankin (Stroke Patients Only)       Balance Overall balance assessment: Mild deficits observed, not formally tested                                          Cognition Arousal: Alert Behavior During Therapy: WFL for tasks assessed/performed Overall Cognitive Status: Within Functional Limits for tasks assessed                                          Exercises      General Comments        Pertinent Vitals/Pain Pain Assessment Pain Assessment: No/denies pain    Home Living                          Prior Function  PT Goals (current goals can now be found in the care plan section) Acute Rehab PT Goals Patient Stated Goal: Get rid of cough and improve activity tolerance PT Goal Formulation: With patient Time For Goal Achievement: 04/03/23 Potential to Achieve Goals: Good Progress towards PT goals: Progressing toward goals    Frequency    Min 1X/week      PT Plan      Co-evaluation              AM-PAC PT "6 Clicks" Mobility   Outcome Measure  Help needed turning from your back to your side while in a flat bed without using bedrails?: A Little Help needed moving from lying on your back to sitting on the side of a flat bed without using bedrails?: A Little Help needed moving to and from a bed to a chair (including a wheelchair)?: A Little Help needed standing up from a chair using your arms (e.g., wheelchair or bedside chair)?: A Little Help needed to walk in hospital room?: A Little Help needed climbing 3-5 steps with a  railing? : A Lot 6 Click Score: 17    End of Session Equipment Utilized During Treatment: Gait belt Activity Tolerance: Patient tolerated treatment well Patient left: in bed;with call bell/phone within reach Nurse Communication: Mobility status PT Visit Diagnosis: Other abnormalities of gait and mobility (R26.89);Difficulty in walking, not elsewhere classified (R26.2)     Time: 0630-1601 PT Time Calculation (min) (ACUTE ONLY): 27 min  Charges:    $Therapeutic Activity: 23-37 mins PT General Charges $$ ACUTE PT VISIT: 1 Visit                    Lyman Speller PT, DPT  Acute Rehabilitation Services  Office [phone]

## 2023-03-29 NOTE — Plan of Care (Signed)
  Problem: Education: Goal: Knowledge of General Education information will improve Description: Including pain rating scale, medication(s)/side effects and non-pharmacologic comfort measures Outcome: Progressing   Problem: Health Behavior/Discharge Planning: Goal: Ability to manage health-related needs will improve Outcome: Progressing   Problem: Clinical Measurements: Goal: Ability to maintain clinical measurements within normal limits will improve Outcome: Progressing Goal: Will remain free from infection Outcome: Progressing Goal: Diagnostic test results will improve Outcome: Progressing Goal: Respiratory complications will improve Outcome: Progressing Goal: Cardiovascular complication will be avoided Outcome: Progressing   Problem: Activity: Goal: Risk for activity intolerance will decrease Outcome: Progressing   Problem: Coping: Goal: Level of anxiety will decrease Outcome: Progressing   Problem: Elimination: Goal: Will not experience complications related to bowel motility Outcome: Progressing Goal: Will not experience complications related to urinary retention Outcome: Progressing   Problem: Pain Managment: Goal: General experience of comfort will improve Outcome: Progressing   Problem: Safety: Goal: Ability to remain free from injury will improve Outcome: Progressing   Problem: Skin Integrity: Goal: Risk for impaired skin integrity will decrease Outcome: Progressing   Problem: Education: Goal: Ability to describe self-care measures that may prevent or decrease complications (Diabetes Survival Skills Education) will improve Outcome: Progressing   Problem: Coping: Goal: Ability to adjust to condition or change in health will improve Outcome: Progressing   Problem: Fluid Volume: Goal: Ability to maintain a balanced intake and output will improve Outcome: Progressing   Problem: Health Behavior/Discharge Planning: Goal: Ability to identify and utilize  available resources and services will improve Outcome: Progressing Goal: Ability to manage health-related needs will improve Outcome: Progressing   Problem: Metabolic: Goal: Ability to maintain appropriate glucose levels will improve Outcome: Progressing   Problem: Nutritional: Goal: Maintenance of adequate nutrition will improve Outcome: Progressing Goal: Progress toward achieving an optimal weight will improve Outcome: Progressing   Problem: Skin Integrity: Goal: Risk for impaired skin integrity will decrease Outcome: Progressing   Problem: Tissue Perfusion: Goal: Adequacy of tissue perfusion will improve Outcome: Progressing

## 2023-03-29 NOTE — Consult Note (Signed)
NAME:  Kathleen [name], MRN:  [id number], DOB:  06/27/1945, LOS: 3 ADMISSION DATE:  03/26/2023, CONSULTATION DATE:  03/29/2023 REFERRING MD:  Dr. Marland Mcalpine - [facility], CHIEF COMPLAINT:  Acute respiratory distress    History of Present Illness:  Kathleen [name] is a 77 y.o. with a past medical history significant for Boop, OSA on BIPAP, COPD, chronic hypoxic respiratory failure on 5 L nasal cannula at baseline, spontaneous pneumothorax, asthma CHF, complete heart block now s/pacemaker, HFrEF, HTN, atrial fibrillation, and colovesicular fistula who presented to the ED 10/18 with complaint of nonproductive cough with wheezing and low-grade fever.  Pertinent  Medical History  Boop, OSA on BIPAP, COPD, chronic hypoxic respiratory failure on 5 L nasal cannula at baseline, spontaneous pneumothorax, asthma CHF, complete heart block now s/pacemaker, HFrEF, HTN, atrial fibrillation, and colovesicular fistula   Significant Hospital Events: Including procedures, antibiotic start and stop dates in addition to other pertinent events   10/18 presented with both GI and respiratory complaints with extensive pulmonary history 10/21 PCCM consulted   Interim History / Subjective:  Seen sitting up in bed eating lunch Continues to report hacking irritating non-productive cough   Objective   Blood pressure 127/69, pulse 70, temperature (!) 97.5 F (36.4 C), temperature source Oral, resp. rate 16, height 5' (1.524 m), weight 89.8 kg, SpO2 96%.    FiO2 (%):  [40 %] 40 %   Intake/Output Summary (Last 24 hours) at 03/29/2023 1327 Last data filed at 03/29/2023 1000 Gross per 24 hour  Intake 920 ml  Output --  Net 920 ml   Filed Weights   03/28/23 0557 03/28/23 0700 03/29/23 0500  Weight: 85.5 kg 88.6 kg 89.8 kg    Examination: General: Acute on chronically ill appearing elderly female lying in bed, in NAD HEENT: ETT, MM pink/moist, PERRL,  Neuro: Alert and oriented x3, non-focal  CV: s1s2 regular rate and  rhythm, no murmur, rubs, or gallops,  PULM: Diminished bilaterally, no increased work of breathing, dry hacking cough, on 5L GI: soft, bowel sounds active in all 4 quadrants, non-tender, non-distended, tolerating oral diet Extremities: warm/dry, no edema  Skin: no rashes or lesions   Resolved Hospital Problem list     Assessment & Plan:  Acute on chronic hypoxic respiratory failure -Utilizes 4-5L [place] at baseline  COPD with overlapping tracheobronchomalacia  Former smoker  -Most recent exacerbation early September treated with Doxy and prednisone taper  OSA on BIPAP -Reports intermittent use of BIPAP  Pulmonary nodule  -Stable 5 mm nodule in the right lower lobe seen on CT ABD/pelvis 03/24/23 HFrEF -Last weight at HF follow up 86.6kg, per note patient appears dry at this weight  P: Continue supplemental oxygen Schedule bronchodilators  Continue solu-medrol Change Hycodan to Tussinex Head of bed elevated 30 degree Ensure adequate pulmonary hygiene  Aspiration precautions  Consider obtaining chest CT  Empiric Doxy and Flagyl Strict intake and output  Daily assessment for need to diurese    Best Practice (right click and "Reselect all SmartList Selections" daily)  Per primary  Labs   CBC: Recent Labs  Lab 03/26/23 2016 03/27/23 0608 03/27/23 1418 03/28/23 0015 03/28/23 2117 03/29/23 0542 03/29/23 0842 03/29/23 1013  WBC 14.9* 12.2* 16.0* 17.4*  --   --   --  14.6*  NEUTROABS 12.3* 11.6* 15.0* 16.1*  --   --   --  12.3*  HGB 11.2* 9.7* 10.1* 9.9* 10.2* 9.8* 10.6* 11.0*  HCT 35.0* 31.5* 32.3* 32.1* 33.4* 32.2* 33.5* 36.1  MCV 82.5 86.5 85.7 87.0  --   --   --  88.5  PLT 234 200 210 217  --   --   --  284    Basic Metabolic Panel: Recent Labs  Lab 03/26/23 2016 03/26/23 2117 03/27/23 0608 03/27/23 0835 03/27/23 1418 03/28/23 0015 03/29/23 1013  NA 134*  --  134*  --  136 134* 139  K 2.7*  --  3.2*  --  3.7 4.0 4.1  CL 91*  --  96*  --  102 100 102  CO2  28  --  23  --  23 23 22   GLUCOSE 199*  --  247*  --  137* 198* 163*  BUN 19  --  23  --  20 25* 21  CREATININE 0.99  --  0.93  --  0.65 0.68 0.70  CALCIUM 8.8*  --  8.4*  --  8.5* 9.0 10.3  MG  --  1.9  --  1.7 1.7 2.4 2.2  PHOS  --   --   --  2.3* 2.4* 2.0* 3.3   GFR: Estimated Creatinine Clearance: 59.7 mL/min (by C-G formula based on SCr of 0.7 mg/dL). Recent Labs  Lab 03/27/23 0608 03/27/23 0835 03/27/23 1023 03/27/23 1418 03/27/23 1803 03/28/23 0015 03/28/23 0614 03/28/23 1632 03/28/23 1826 03/29/23 1013  PROCALCITON  --  0.14  --   --   --   --   --   --   --   --   WBC 12.2*  --   --  16.0*  --  17.4*  --   --   --  14.6*  LATICACIDVEN 5.2* 3.7*   < >  --    < > 2.9* 3.7* 3.6* 2.2*  --    < > = values in this interval not displayed.    Liver Function Tests: Recent Labs  Lab 03/27/23 0835 03/27/23 1418 03/28/23 0015 03/29/23 1013  AST 16 16 16 17   ALT 10 10 11 13   ALKPHOS 50 51 48 46  BILITOT 0.8 0.5 0.5 0.6  PROT 6.2* 6.2* 6.0* 6.8  ALBUMIN 3.1* 3.2* 3.2* 3.5   No results for input(s): "LIPASE", "AMYLASE" in the last 168 hours. No results for input(s): "AMMONIA" in the last 168 hours.  ABG    Component Value Date/Time   PHART 7.343 (L) 03/20/2022 1316   PCO2ART 33.4 03/20/2022 1316   PO2ART 87 03/20/2022 1316   HCO3 18.1 (L) 03/20/2022 1316   TCO2 19 (L) 03/20/2022 1316   ACIDBASEDEF 7.0 (H) 03/20/2022 1316   O2SAT 96 03/20/2022 1316     Coagulation Profile: No results for input(s): "INR", "PROTIME" in the last 168 hours.  Cardiac Enzymes: No results for input(s): "CKTOTAL", "CKMB", "CKMBINDEX", "TROPONINI" in the last 168 hours.  HbA1C: Hemoglobin A1C  Date/Time Value Ref Range Status  12/26/2012 06:29 AM 6.6 (H) 4.2 - 6.3 % Final    Comment:    The American Diabetes Association recommends that a primary goal of therapy should be <7% and that physicians should reevaluate the treatment regimen in patients with HbA1c values consistently  >8%.    Hgb A1c MFr Bld  Date/Time Value Ref Range Status  02/22/2023 08:13 PM 5.5 4.8 - 5.6 % Final    Comment:    (NOTE) Pre diabetes:          5.7%-6.4%  Diabetes:              >6.4%  Glycemic control  for   <7.0% adults with diabetes   04/30/2022 01:58 AM 5.8 (H) 4.8 - 5.6 % Final    Comment:    (NOTE)         Prediabetes: 5.7 - 6.4         Diabetes: >6.4         Glycemic control for adults with diabetes: <7.0     CBG: Recent Labs  Lab 03/28/23 1206 03/28/23 1638 03/28/23 2041 03/29/23 0717 03/29/23 1147  GLUCAP 142* 157* 141* 159* 125*    Review of Systems:   Please see the history of present illness. All other systems reviewed and are negative   Past Medical History:  She,  has a past medical history of Asthma, 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HCC), CHF (congestive heart failure) (HCC), Chronic renal insufficiency, 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, Concussion (10/04/2021), COPD (chronic obstructive pulmonary disease) (HCC), Diverticulitis, Gout, Hypertension, Longstanding persistent atrial fibrillation (HCC), Nonischemic cardiomyopathy (HCC), Obesity, Pacemaker, and Spontaneous pneumothorax (2013).   Surgical History:   Past Surgical History:  Procedure Laterality Date   ABDOMINAL HYSTERECTOMY     APPENDECTOMY     ATRIAL FIBRILLATION ABLATION  07/20/2013   by Dr Christin Fudge   AV nodal ablation  11/01/2013   by Dr Christin Fudge, repeated by Dr Wilford Grist   BREAST BIOPSY Bilateral 1997   negative   CARDIAC CATHETERIZATION     CHOLECYSTECTOMY     HERNIA REPAIR     PACEMAKER INSERTION  06/2017   MDT Viva CRT-P implanted by Dr Christin Fudge after AV nodal ablation,  LV lead could not be placed   PACEMAKER INSERTION  05/2020   with lead bundle   RIGHT/LEFT HEART CATH AND CORONARY ANGIOGRAPHY N/A 03/20/2022   Procedure: RIGHT/LEFT HEART CATH AND CORONARY ANGIOGRAPHY;  Surgeon: [place], Peter M, MD;  Location: [facility] INVASIVE CV LAB;   Service: Cardiovascular;  Laterality: N/A;     Social History:   reports that she quit smoking about 35 years ago. Her smoking use included cigarettes. She started smoking about 55 years ago. She has a 20 pack-year smoking history. She has been exposed to tobacco smoke. She has never used smokeless tobacco. She reports that she does not drink alcohol and does not use drugs.   Family History:  Her family history includes Breast cancer in her cousin; Breast cancer (age of onset: 49) in her mother; Emphysema in her father; Healthy in her brother; Obesity in her daughter; Pancreatic cancer in her mother; Valvular heart disease in her son. There is no history of Colon cancer, Esophageal cancer, Stomach cancer, Inflammatory bowel disease, Liver disease, or Rectal cancer.   Allergies Allergies  Allergen Reactions   Allopurinol Other (See Comments)    Reaction:  Dizziness    Clindamycin Anaphylaxis and Hives   Flublok [Influenza Vaccine Recombinant] Other (See Comments)    Fever 103 with no alternative explanation day after vaccine.  Clydie Braun Highfill FNP-C   Pneumococcal 13-Val Conj Vacc Itching, Swelling and Rash   Dronedarone Rash   Montelukast Other (See Comments)    Makes her loopy.   Brovana [Arformoterol]     Caused muscle pain   Budesonide     Caused extreme joint pain   Entresto [Sacubitril-Valsartan] Other (See Comments)    hypotension   Fosamax [Alendronate Sodium] Nausea Only   Jardiance [Empagliflozin]     Caused a vaginal infection   Meperidine Nausea And Vomiting   Microplegia Msa-Msg [Plegisol]     Loopy,diarrhea  Rosuvastatin Other (See Comments)    Reaction:  Muscle spasms    Tetracycline Hives   Adhesive [Tape] Rash   Lovastatin Rash and Other (See Comments)    Muscle Pain     Home Medications  Prior to Admission medications   Medication Sig Start Date End Date Taking? Authorizing Provider  acetaminophen (TYLENOL) 650 MG CR tablet Take 1,300 mg by mouth every 8  (eight) hours as needed for pain.   Yes [provider]  azelastine (ASTELIN) 0.1 % nasal spray Place 2 sprays into both nostrils 2 (two) times daily. 11/05/22  Yes Oretha Milch, MD  Cholecalciferol (VITAMIN D3) 50 MCG (2000 UT) TABS Take 2,000 Units by mouth at bedtime.   Yes [provider]  docusate sodium (COLACE) 100 MG capsule Take 100 mg by mouth 2 (two) times daily.   Yes [provider]  dofetilide (TIKOSYN) 250 MCG capsule Take 1 capsule (250 mcg total) by mouth in the morning and at bedtime. 12/01/22 12/01/23 Yes Fenton, Clint R, PA  fluticasone furoate-vilanterol (BREO ELLIPTA) 100-25 MCG/ACT AEPB INHALE 1 PUFF INTO THE LUNGS DAILY 03/23/23  Yes Oretha Milch, MD  gabapentin (NEURONTIN) 100 MG capsule Take 2 capsules (200 mg total) by mouth at bedtime. 11/03/22 06/01/23 Yes Camara, Amalia Hailey, MD  levalbuterol (XOPENEX) 0.63 MG/3ML nebulizer solution Take 3 mLs (0.63 mg total) by nebulization every 6 (six) hours as needed for wheezing or shortness of breath. DX J44.89 J96.21 01/20/23  Yes Oretha Milch, MD  losartan (COZAAR) 25 MG tablet Take 12.5 mg by mouth daily.   Yes [provider]  Magnesium 500 MG TABS Take 500 mg by mouth at bedtime.   Yes [provider]  metolazone (ZAROXOLYN) 2.5 MG tablet Take 1 tablet (2.5 mg total) by mouth once a week. MUST TAKE OF POTASSIUM WITH THIS 03/24/23 06/22/23 Yes Clegg, Amy D, NP  OXYGEN Inhale 5 L into the lungs continuous. Use with resmed ventilator   Yes [provider]  potassium chloride SA (KLOR-CON M) 20 MEQ tablet Take 2 tablets (40 mEq total) with weekly Metolazone dosage. 03/24/23  Yes Clegg, Amy D, NP  revefenacin (YUPELRI) 175 MCG/3ML nebulizer solution Take 175 mcg by nebulization daily.   Yes [provider]  rivaroxaban (XARELTO) 20 MG TABS tablet Take 1 tablet (20 mg total) by mouth daily with supper. 02/01/23  Yes Bensimhon, [name], MD  torsemide (DEMADEX) 20 MG  tablet Take 3 tablets (60 mg total) by mouth daily. Patient taking differently: Take 40 mg by mouth daily. 03/24/23 06/22/23 Yes Clegg, Amy D, NP  cyanocobalamin (VITAMIN B12) 1000 MCG/ML injection Inject 1 mL (1,000 mcg total) into the muscle every 30 (thirty) days. 05/05/22   [name], MD  Na Sulfate-K Sulfate-Mg Sulf (SUPREP BOWEL PREP KIT) 17.5-3.13-1.6 GM/177ML SOLN Take 1 kit by mouth as directed. For colonoscopy prep 03/17/23   Mansouraty, Netty Starring., MD     Critical care time: NA  Annalysa Mohammad D. Harris, NP-C [facility] Personal contact information can be found on Amion  If no contact or response made please call [phone] 03/29/2023, 1:57 PM

## 2023-03-29 NOTE — Consult Note (Addendum)
Consultation  Referring Provider: [facility]/ [facility] Primary Care Physician:  [name], MD Primary Gastroenterologist:  Dr.Mansouraty  Reason for Consultation: Persistent sigmoid diverticulitis, bloody stool  HPI: Kathleen [name] is a 77 y.o. female with numerous serious comorbidities, including chronic respiratory failure/O2 dependent 5 L nasal cannula chronically, history of sleep apnea with CPAP use, Boop, COPD, congestive heart failure with EF 35 to 40%, atrial fibrillation status post ablation, status post permanent pacemaker, history of chronic kidney disease, iron deficiency anemia, prolonged QT, obesity.  She is on Xarelto for atrial fibrillation. Known to Dr. Meridee Score with history of complicated diverticulitis.  She had undergone CT colonography in 2021 done through [facility], but has not had regular colonoscopy.  She was found to have 2 polyps at that time, colonoscopy was to be done but she developed other medical issues and this had never been accomplished. 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  She was initially treated with Zosyn, then discharged on Augmentin which she took for 2 weeks at home.  She was seen in the office by Dr. Irish Lack Roddy on 03/17/2023 in follow-up.  At that time she stated that she had had some increase in lower abdominal discomfort which she was describing as sharp and stabbing over the previous couple of days, she had not had any fever or chills.  She was having minimal stools and stating that they were very thin. Repeat CT was ordered and this was done on 03/24/2023 with finding of stable 5 mm right lower lobe nodule, a few hepatic cysts, status postcholecystectomy, there were persistent inflammatory changes in the sigmoid colon consistent with diverticulitis, no abscess or perforation.  There was also persistent hyper intense track between the sigmoid to the vaginal cuff consistent  with colovaginal fistula.  Tooth plan had been for colonoscopy to be done at the hospital on 1125 per Dr. Meridee Score.   This study had not been read at the time that she presented to the ER on 03/26/2023 with complaints of increased shortness of breath, fevers and dry cough.  She was noted to be febrile in the ER to 102.5. Blood cultures were done and are negative. She has been treated with Rocephin, metronidazole and doxycycline, and steroids  She says she has had ongoing lower abdominal discomfort but has noticed over the past couple of days that it has worsened again.  She also says that she really is not having as much stool through her rectum as she is through her vagina at this point and any stool that comes to the rectum has been very thin and narrow, the stool that comes through the vagina has been mushy.  Yesterday she felt urge for a bowel movement, says she felt something "plop" and when she got into the bathroom noticed that her pad that she wears was soaked with blood and there were some small clots.  She is not sure whether this came from the vagina or the rectum, her nurse at that time after cleaning her felt that it had come from the rectum. Fortunately this has not recurred.  She is anticoagulated on Xarelto.  A 03/27/2021 shows cardiomegaly and mild increased vascular congestion.  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    Concussion 10/04/2021   COPD (chronic obstructive pulmonary disease) (HCC)    Diverticulitis    Gout    Hypertension    Longstanding persistent atrial fibrillation (HCC)    on Xarelto   Nonischemic cardiomyopathy (HCC)    Obesity     Pacemaker    Spontaneous pneumothorax 2013    Past Surgical History:  Procedure Laterality Date   ABDOMINAL HYSTERECTOMY     APPENDECTOMY     ATRIAL FIBRILLATION ABLATION  07/20/2013   by Dr Christin Fudge   AV nodal ablation  11/01/2013   by Dr Christin Fudge, repeated by Dr Wilford Grist   BREAST BIOPSY Bilateral 1997   negative   CARDIAC CATHETERIZATION     CHOLECYSTECTOMY     HERNIA REPAIR     PACEMAKER INSERTION  06/2017   MDT Viva CRT-P implanted by Dr Christin Fudge after AV nodal ablation,  LV lead could not be placed   PACEMAKER INSERTION  05/2020   with lead bundle   RIGHT/LEFT HEART CATH AND CORONARY ANGIOGRAPHY N/A 03/20/2022   Procedure: RIGHT/LEFT HEART CATH AND CORONARY ANGIOGRAPHY;  Surgeon: [place], Peter M, MD;  Location: [facility] INVASIVE CV LAB;  Service: Cardiovascular;  Laterality: N/A;    Prior to Admission medications   Medication Sig Start Date End Date Taking? Authorizing Provider  acetaminophen (TYLENOL) 650 MG CR tablet Take 1,300 mg by mouth every 8 (eight) hours as needed for pain.   Yes [provider]  azelastine (ASTELIN) 0.1 % nasal spray Place 2 sprays into both nostrils 2 (two) times daily. 11/05/22  Yes Oretha Milch, MD  Cholecalciferol (VITAMIN D3) 50 MCG (2000 UT) TABS Take 2,000 Units by mouth at bedtime.   Yes [provider]  docusate sodium (COLACE) 100 MG capsule Take 100 mg by mouth 2 (two) times daily.   Yes [provider]  dofetilide (TIKOSYN) 250 MCG capsule Take 1 capsule (250 mcg total) by mouth in the morning and at bedtime. 12/01/22 12/01/23 Yes Fenton, Clint R, PA  fluticasone furoate-vilanterol (BREO ELLIPTA) 100-25 MCG/ACT AEPB INHALE 1 PUFF INTO THE LUNGS DAILY 03/23/23  Yes Oretha Milch, MD  gabapentin (NEURONTIN) 100 MG capsule Take 2 capsules (200 mg total) by mouth at bedtime. 11/03/22 06/01/23 Yes Camara, Amalia Hailey, MD  levalbuterol (XOPENEX) 0.63 MG/3ML nebulizer solution Take 3 mLs (0.63 mg total) by nebulization every 6 (six)  hours as needed for wheezing or shortness of breath. DX J44.89 J96.21 01/20/23  Yes Oretha Milch, MD  losartan (COZAAR) 25 MG tablet Take 12.5 mg by mouth daily.   Yes [provider]  Magnesium 500 MG TABS Take 500 mg by mouth at bedtime.   Yes [provider]  metolazone (ZAROXOLYN) 2.5 MG tablet Take 1 tablet (2.5 mg total) by mouth once a week. MUST TAKE OF POTASSIUM WITH THIS 03/24/23 06/22/23 Yes Clegg, Amy D, NP  OXYGEN Inhale 5 L into the lungs continuous. Use with resmed ventilator   Yes [provider]  potassium chloride SA (KLOR-CON M) 20 MEQ tablet Take 2 tablets (40 mEq total) with weekly Metolazone dosage. 03/24/23  Yes Clegg, Amy D, NP  revefenacin (YUPELRI) 175 MCG/3ML nebulizer solution Take 175 mcg by nebulization daily.   Yes [provider]  rivaroxaban (XARELTO) 20 MG TABS tablet Take 1 tablet (20 mg total) by mouth daily with supper. 02/01/23  Yes Bensimhon, [name], MD  torsemide (  DEMADEX) 20 MG tablet Take 3 tablets (60 mg total) by mouth daily. Patient taking differently: Take 40 mg by mouth daily. 03/24/23 06/22/23 Yes Clegg, Amy D, NP  cyanocobalamin (VITAMIN B12) 1000 MCG/ML injection Inject 1 mL (1,000 mcg total) into the muscle every 30 (thirty) days. 05/05/22   [name], MD  Na Sulfate-K Sulfate-Mg Sulf (SUPREP BOWEL PREP KIT) 17.5-3.13-1.6 GM/177ML SOLN Take 1 kit by mouth as directed. For colonoscopy prep 03/17/23   Mansouraty, Netty Starring., MD    Current Facility-Administered Medications  Medication Dose Route Frequency Provider Last Rate Last Admin   albuterol (PROVENTIL) (2.5 MG/3ML) 0.083% nebulizer solution 2.5 mg  2.5 mg Nebulization Q2H PRN Crosley, Debby, MD       cefTRIAXone (ROCEPHIN) 2 g in sodium chloride 0.9 % 100 mL IVPB  2 g Intravenous Q24H Sheikh, Omair Latif, DO 200 mL/hr at 03/29/23 1022 2 g at 03/29/23 1022   dextromethorphan-guaiFENesin (MUCINEX DM) 30-600 MG per 12 hr tablet 1 tablet  1 tablet Oral  BID Gery Pray, MD   1 tablet at 03/29/23 1002   dofetilide (TIKOSYN) capsule 250 mcg  250 mcg Oral BID Gery Pray, MD   250 mcg at 03/29/23 0854   doxycycline (VIBRA-TABS) tablet 100 mg  100 mg Oral Q12H Len Childs T, RPH   100 mg at 03/29/23 1002   fluticasone furoate-vilanterol (BREO ELLIPTA) 100-25 MCG/ACT 1 puff  1 puff Inhalation Daily Crosley, Debby, MD   1 puff at 03/29/23 0756   gabapentin (NEURONTIN) capsule 200 mg  200 mg Oral QHS Crosley, Debby, MD   200 mg at 03/28/23 2135   HYDROcodone bit-homatropine (HYCODAN) 5-1.5 MG/5ML syrup 5 mL  5 mL Oral Q4H PRN Joneen Roach, Debby, MD   5 mL at 03/28/23 2135   insulin aspart (novoLOG) injection 0-15 Units  0-15 Units Subcutaneous TID WC Crosley, Debby, MD   2 Units at 03/29/23 1409   insulin aspart (novoLOG) injection 0-5 Units  0-5 Units Subcutaneous QHS Crosley, Debby, MD   3 Units at 03/27/23 0040   levalbuterol (XOPENEX) nebulizer solution 0.63 mg  0.63 mg Nebulization Q6H Sheikh, Omair Latif, DO   0.63 mg at 03/29/23 1340   losartan (COZAAR) tablet 12.5 mg  12.5 mg Oral Daily Crosley, Debby, MD   12.5 mg at 03/29/23 1002   methylPREDNISolone sodium succinate (SOLU-MEDROL) 40 mg/mL injection 40 mg  40 mg Intravenous Q12H Sheikh, Kateri [place], DO   40 mg at 03/29/23 1001   metroNIDAZOLE (FLAGYL) IVPB 500 mg  500 mg Intravenous Q12H Marguerita Merles [place], DO 100 mL/hr at 03/29/23 1413 500 mg at 03/29/23 1413   morphine (PF) 2 MG/ML injection 2 mg  2 mg Intravenous Q2H PRN Anthoney Harada, NP   2 mg at 03/29/23 0124   phenol (CHLORASEPTIC) mouth spray 1 spray  1 spray Mouth/Throat PRN Marguerita Merles Latif, DO   1 spray at 03/27/23 1752   potassium chloride SA (KLOR-CON M) CR tablet 40 mEq  40 mEq Oral Daily Crosley, Debby, MD   40 mEq at 03/29/23 1001   prochlorperazine (COMPAZINE) injection 5 mg  5 mg Intravenous Q4H PRN Crosley, Debby, MD       revefenacin (YUPELRI) nebulizer solution 175 mcg  175 mcg Nebulization Daily Crosley, Debby, MD    175 mcg at 03/29/23 0751   rivaroxaban (XARELTO) tablet 20 mg  20 mg Oral Q supper Crosley, Debby, MD   20 mg at 03/28/23 1850   umeclidinium bromide (INCRUSE ELLIPTA) 62.5  MCG/ACT 1 puff  1 puff Inhalation Daily Gery Pray, MD   1 puff at 03/29/23 0756   witch hazel-glycerin (TUCKS) pad   Topical PRN Anthoney Harada, NP        Allergies as of 03/26/2023 - Review Complete 03/26/2023  Allergen Reaction Noted   Allopurinol Other (See Comments) 09/08/2015   Clindamycin Anaphylaxis and Hives 10/29/2014   Flublok [influenza vaccine recombinant] Other (See Comments) 03/31/2022   Pneumococcal 13-val conj vacc Itching, Swelling, and Rash 11/21/2014   Dronedarone Rash 10/29/2014   Montelukast Other (See Comments) 02/12/2023   Brovana [arformoterol]  08/21/2021   Budesonide  08/21/2021   Entresto [sacubitril-valsartan] Other (See Comments) 06/07/2018   Fosamax [alendronate sodium] Nausea Only 01/25/2019   Jardiance [empagliflozin]  08/21/2021   Meperidine Nausea And Vomiting 10/29/2014   Microplegia msa-msg [plegisol]  10/04/2021   Rosuvastatin Other (See Comments) 10/29/2014   Tetracycline Hives 10/29/2014   Adhesive [tape] Rash 09/17/2015   Lovastatin Rash and Other (See Comments) 05/23/2013    Family History  Problem Relation Age of Onset   Breast cancer Mother 60       3 different times   Pancreatic cancer Mother    Emphysema Father    Healthy Brother    Breast cancer Cousin    Valvular heart disease Son    Obesity Daughter    Colon cancer Neg Hx    Esophageal cancer Neg Hx    Stomach cancer Neg Hx    Inflammatory bowel disease Neg Hx    Liver disease Neg Hx    Rectal cancer Neg Hx     Social History   Socioeconomic History   Marital status: Widowed    Spouse name: Not on file   Number of children: 1   Years of education: Not on file   Highest education level: Not on file  Occupational History   Occupation: retired  Tobacco Use   Smoking status: Former     Current packs/day: 0.00    Average packs/day: 1 pack/day for 20.0 years (20.0 ttl pk-yrs)    Types: Cigarettes    Start date: 06/09/1967    Quit date: 06/09/1987    Years since quitting: 35.8    Passive exposure: Past   Smokeless tobacco: Never   Tobacco comments:    Former smoker 12/25/21  Vaping Use   Vaping status: Never Used  Substance and Sexual Activity   Alcohol use: No    Alcohol/week: 0.0 standard drinks of alcohol   Drug use: No   Sexual activity: Not Currently  Other Topics Concern   Not on file  Social History Narrative   Pt lives in [place] alone.  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  Her son works in [employer].   Social Determinants of Health   Financial Resource Strain: Low Risk  (05/21/2022)   Overall Financial Resource Strain (CARDIA)    Difficulty of Paying Living Expenses: Not very hard  Food Insecurity: No Food Insecurity (03/27/2023)   Hunger Vital Sign    Worried About Running Out of Food in the Last Year: Never true    Ran Out of Food in the Last Year: Never true  Transportation Needs: No Transportation Needs (03/27/2023)   PRAPARE - Administrator, Civil Service (Medical): No    Lack of Transportation (Non-Medical): No  Physical Activity: Insufficiently Active (05/21/2022)   Exercise Vital Sign    Days of Exercise per Week: 2 days  Minutes of Exercise per Session: 20 min  Stress: No Stress Concern Present (05/21/2022)   [employer] of Occupational Health - Occupational Stress Questionnaire    Feeling of Stress : Only a little  Social Connections: Moderately Isolated (05/21/2022)   Social Connection and Isolation Panel [NHANES]    Frequency of Communication with Friends and Family: More than three times a week    Frequency of Social Gatherings with Friends and Family: More than three times a week    Attends Religious Services: Never    Database administrator or Organizations: Yes    Attends Hospital doctor: More than 4 times per year    Marital Status: Widowed  Intimate Partner Violence: Not At Risk (03/27/2023)   Humiliation, Afraid, Rape, and Kick questionnaire    Fear of Current or Ex-Partner: No    Emotionally Abused: No    Physically Abused: No    Sexually Abused: No    Review of Systems: Pertinent positive and negative review of systems were noted in the above HPI section.  All other review of systems was otherwise negative.   Physical Exam: Vital signs in last 24 hours: Temp:  [97.5 F (36.4 C)-97.9 F (36.6 C)] 97.5 F (36.4 C) (10/21 0612) Pulse Rate:  [70-84] 70 (10/21 0612) Resp:  [16-25] 16 (10/21 0612) BP: (127-156)/(55-69) 127/69 (10/21 0612) SpO2:  [94 %-100 %] 96 % (10/21 0757) FiO2 (%):  [40 %] 40 % (10/20 1443) Weight:  [89.8 kg] 89.8 kg (10/21 0500) Last BM Date : 03/28/23 General:   Alert,  Well-developed, well-nourished, obese elderly white female pleasant and cooperative in NAD, sitting at the side of the bed, had just had a breathing treatment, on nasal O2 Head:  Normocephalic and atraumatic. Eyes:  Sclera clear, no icterus.   Conjunctiva pink. Ears:  Normal auditory acuity. Nose:  No deformity, discharge,  or lesions. Mouth:  No deformity or lesions.   Neck:  Supple; no masses or thyromegaly. Lungs: No increased work of breathing, decreased breath sounds bilaterally few scattered rhonchi  Heart:  Regular rate and rhythm; no murmurs, clicks, rubs,  or gallops. Abdomen:  Soft, obese, she is tender in the left mid/left lower quadrant guarding, no rebound, also tender in the suprapubic area, BS active,nonpalp mass or hsm.   Rectal: Not done Msk:  Symmetrical without gross deformities. . Pulses:  Normal pulses noted. Extremities:  Without clubbing or edema. Neurologic:  Alert and  oriented x4;  grossly normal neurologically. Skin:  Intact without significant lesions or rashes.. Psych:  Alert and cooperative. Normal mood and affect.  Intake/Output  from previous day: 10/20 0701 - 10/21 0700 In: 920 [P.O.:720; IV Piggyback:200] Out: -  Intake/Output this shift: Total I/O In: 240 [P.O.:240] Out: -   Lab Results: Recent Labs    03/27/23 1418 03/28/23 0015 03/28/23 2117 03/29/23 0542 03/29/23 0842 03/29/23 1013  WBC 16.0* 17.4*  --   --   --  14.6*  HGB 10.1* 9.9*   < > 9.8* 10.6* 11.0*  HCT 32.3* 32.1*   < > 32.2* 33.5* 36.1  PLT 210 217  --   --   --  284   < > = values in this interval not displayed.   BMET Recent Labs    03/27/23 1418 03/28/23 0015 03/29/23 1013  NA 136 134* 139  K 3.7 4.0 4.1  CL 102 100 102  CO2 23 23 22   GLUCOSE 137* 198* 163*  BUN 20 25*  21  CREATININE 0.65 0.68 0.70  CALCIUM 8.5* 9.0 10.3   LFT Recent Labs    03/27/23 0835 03/27/23 1418 03/29/23 1013  PROT 6.2*   < > 6.8  ALBUMIN 3.1*   < > 3.5  AST 16   < > 17  ALT 10   < > 13  ALKPHOS 50   < > 46  BILITOT 0.8   < > 0.6  BILIDIR 0.1  --   --   IBILI 0.7  --   --    < > = values in this interval not displayed.   PT/INR No results for input(s): "LABPROT", "INR" in the last 72 hours. Hepatitis Panel No results for input(s): "HEPBSAG", "HCVAB", "HEPAIGM", "HEPBIGM" in the last 72 hours.   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 which was initially recognized September 2024 I do not think she ever cleared the episode of diverticulitis for which she was hospitalized in September despite IV Zosyn then a course of Augmentin at home  #2 hematochezia-from the vagina or the rectum-in setting of persistent diverticulitis, she likely had some self-limited diverticular bleeding-certainly may also have friability/ulceration associated with the fistula  She is also now on IV steroids which may mask some of her intra-abdominal/diverticulitis symptoms  #3 anticoagulation-on Xarelto #4 acute on chronic respiratory failure-on chronic oxygen 5 L nasal cannula  at home Pneumonia  #5 COPD #6 BOOP #7 sleep apnea/currently using BiPAP #8 atrial fibrillation #9 prolonged QT #10.  Congestive heart failure with EF 35 to 40% #11 history of iron deficiency anemia  PLAN: Patient is a very high risk candidate for any endoscopic or surgical intervention.  She cannot undergo colonoscopy at this time at any rate because she still has persistent diverticulitis.  (Tentatively scheduled for 05/03/2023/Mansouraty) Allow diet as tolerated Will discuss antibiotic regimen, consider switching to Zosyn, likely will need longer course of oral antibiotics as an outpatient. Consider repeat CT imaging prior to discharge Follow serial hemoglobins, transfuse as indicated, if she were to have significant active bleeding would proceed to CTA. Stop steroids when able  from respiratory standpoint GI will follow with you     Amy Esterwood PA-C 03/29/2023, 2:17 PM     Attending physician's note   I have taken history, reviewed the chart and examined the patient. I performed a substantive portion of this encounter, including complete performance of at least one of the key components, in conjunction with the APP. I agree with the Advanced Practitioner's note, impression and recommendations.   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  CT reviewed. Adm d/t respiratory failure w/t pneumonia/underlying chronic respiratory failure (5lit/min O2 @baseline ) A-fib on Xarelto/CHB s/p pacemaker OSA on BiPAP CHF (EF 35-40%)  Plan: -Continue A/Bs x 14 days (Ok with doxy/flagyl or IV zosyn to p.o. Augmentin) -At this time, d/t acute diverticulitis and frail cardiopulmonary status, colonoscopy would be contraindicated. -Would consider colon as an outpt.  She is tentatively scheduled with Dr. Meridee Score on 05/03/2023.  She would need cardiopulmonary clearance/optimization prior. -She is also scheduled with Dr. Michaell Cowing (surgery) as outpt.  Encouraged her to keep  that appt -Limit steroids.  Dr Sable Feil is aware of her hospitalization.   Edman Circle, MD [facility] [phone]

## 2023-03-29 NOTE — Progress Notes (Signed)
PROGRESS NOTE    Kathleen [name]  UUV:[id number] DOB: 12-Apr-1946 DOA: 03/26/2023 PCP: [name], MD   Brief Narrative:  HPI per Dr. Gery Pray on 03/27/23  This is a 77 year old female with past medical history of OSA (on BiPAP), BOOP (noncandidate for stenting), COPD, chronic respiratory failure 5L, HFrEF, A. fib (s/p AVN ablation, BiV, on anticoagulation), HTN, gout, and colovesical fistula.  On Tuesday and Wednesday patient developed a dry, rhonchorous, racking nonproductive cough.  She developed wheezing, on and off fevers, Tmax 100 degrees at home.  She is a night she was diaphoretic, soaking nurse clothing and sheets.  Per patient on Wednesday she was called by cardiologist and told to take her Metalazone dose, 30 min later take her torsemide.  She was to have repeated this 2 days.  This was called based on I thought she was fluid overloaded.  The patient is followed by the [facility] and per patient they are able to remotely evaluate her fluid status. (Telephone conversation cardiologist NP noted on the 11th, without directions for additional diuretic usage).  Her son took her to [facility] ER.   In the ER patient Tmax 102.5, vitals otherwise stable.  Patient wheezing on presentation.  Potassium 2.7, magnesium normal.  BNP 48.5, lactic acid 2.7 ==> 6.1.  CXR cardiomegaly with hazy bibasilar atelectasis or infiltrates. Blood cultures x 2 collected, Solu-Medrol, Rocephin, IV doxycycline, Tylenol, 2 L NS, 40 meq K given in the ER.  She is accepted in transfer.  **Interim History  She developed abdominal pain on left side but she does have acute diverticulitis noted on her most recent scan so we will added Flagyl.  She continues to have a significant cough and have added doxycycline.  We are ordering pulmonary toileting and lactic acid level was elevated but is trending down now.  Will continue antibiotic coverage currently.  Have asked GI and pulmonary to evaluate  Assessment and  Plan:  Acute exacerbation of COPD wih Pneumonia  Lactic acidosis  Severe sepsis in the setting of Acute Diverticulitis  Acute on chronic chronic respiratory failure with hypoxia (HCC) -COPD order set/pneumonia order set initiated -Oxygen keep sats greater than 88% SpO2: 97 % O2 Flow Rate (L/min): 5 L/min FiO2 (%): 40 % -Scheduled and as needed nebulizers ordered -Continue IV Solu-Medrol and place on 40 mg q12h for now on Soriah Leeman recommends continuing for now -Mucinex DM scheduled, Hycodan as needed.  (Patient states Jerilynn Som has not been working) -Facilities manager ordered along with Flutter Valve -WBC and Lactic Acid Trend: Recent Labs  Lab 03/17/23 1003 03/26/23 2016 03/27/23 0608 03/27/23 1418 03/28/23 0015  WBC 11.6* 14.9* 12.2* 16.0* 17.4*   Recent Labs  Lab 03/27/23 1023 03/27/23 1803 03/27/23 2026 03/28/23 0015 03/28/23 0614 03/28/23 1632 03/28/23 1826  LATICACIDVEN 4.6* 3.6* 3.9* 2.9* 3.7* 3.6* 2.2*  -Additional IV bolus ordered and will stop IVF -Elevated lactic level may also be due to dehydration and infection.  -Follow lactic acid level curve until normalized.   -COVID/flu/RSV panel negative and Respiratory Virus Panel Negative -Procalcitonin level was 0.14 -Repeat CXR done yesterday and showed " Cardiomegaly and mild pulmonary vascular congestion without frank edema. Aortic atherosclerosis" and repeat chest x-ray pending today -Pulmonary consulted and recommending changing Hycodan Intestinex elevating the head of the bed 30 degrees and ensuring pulmonary hygiene.  They have also placed on aspiration precautions and are considering obtaining a chest CT recommend a yearly assessment for the need to diurese -Gastroenterology consulted  and she is a very high risk for any endoscopic surgical intervention and cannot undergo colonoscopy because she still has a persistent diverticulitis.;  She is scheduled tentatively for colonoscopy on 05/03/2023 and GI  recommending continue antibiotics at this time and they are recommending limiting steroids if able and stopping when okay from a respiratory standpoint   Hypokalemia -Patient's K+ Level Trend: Recent Labs  Lab 03/05/23 1432 03/17/23 1003 03/22/23 1050 03/26/23 2016 03/27/23 0608 03/27/23 1418 03/28/23 0015  K 3.1* 3.3* 4.0 2.7* 3.2* 3.7 4.0  -Continue to Monitor and Replete as Necessary -Repeat CMP in the AM   Prolonged QT -Was 539 -Repleted potassium.  Repeat EKG once potassium normalized -Avoid QT prolongation agents   Hypercoagulable state due to persistent atrial fibrillation (HCC) -On Tikosyn and Xarelto, continued -Will keep magnesium and potassium midrange -May need to hold her Xarelto given her bloody stools   Chronic systolic heart failure (HCC) -BNP 48.  Hold Demadex and metolazone until potassium normalizes.  Hydrating with elevated lactic acid.  Careful for fluid overload, patient's EF was 35 to 40% -Daily potassium continued -Strict I's and O's and daily weights  Intake/Output Summary (Last 24 hours) at 03/28/2023 2051 Last data filed at 03/28/2023 1800 Gross per 24 hour  Intake 2448.19 ml  Output --  Net 2448.19 ml  -Continue to monitor for signs and symptoms of volume overload and repeat chest x-ray in a.m. 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 Persistent acute sigmoid diverticulitis is identified as well. No discrete abscess is noted. Stable 5 mm nodule in the right lower lobe. No follow-up needed if patient is low-risk.  Non-contrast chest CT can be considered in 12 months if patient is high-risk." -Will need outpatient Colonoscopy   Lung Nodule -Stable at 5 mm and  Needs to be followed in the outpatient setting  Normocytic Anemia -Hgb/Hct Trend: Recent Labs  Lab 03/17/23 1003 03/26/23 2016 03/27/23 0608 03/27/23 1418 03/28/23 0015  HGB 11.6* 11.2* 9.7* 10.1* 9.9*  HCT 35.5* 35.0* 31.5* 32.3* 32.1*  MCV 82.2 82.5 86.5 85.7 87.0  -Checked Anemia Panel and showed an iron level of 28, UIBC 279, TIBC 307, saturation ratio 9%, ferritin of 79, folate level 6.7 and vitamin B12 535 -Continue to Monitor for S/Sx of Bleeding had bloody stool this morning so we will check FOBT; Remains on Xarelto but may need to hold  -Will discuss with GI in the morning and trend H&H as every 6 -Repeat CBC in the AM  Hypoalbuminemia -Patient's Albumin Trend: Recent Labs  Lab 03/27/23 0835 03/27/23 1418 03/28/23 0015  ALBUMIN 3.1* 3.2* 3.2*  -Continue to Monitor and Trend and repeat CMP in the AM  Obesity -Complicates overall prognosis and care -Estimated body mass index is 38.14 kg/m as calculated from the following:   Height as of this encounter: 5' (1.524 m).   Weight as of this encounter: 88.6 kg.  -Weight Loss and Dietary Counseling given   DVT prophylaxis:  rivaroxaban (XARELTO) tablet 20 mg    Code Status: Prior Family Communication: No family present at bedside   Disposition Plan:  Level of care: Telemetry Status is: Inpatient Remains inpatient appropriate because: Needs further clinical improvement in Respiratory Status and Abdominal Pain  Consultants:  Gastroenterology Pulmonary   Procedures:  As delineated as above  Antimicrobials:  Anti-infectives (From admission, onward)    Start     Dose/Rate Route Frequency Ordered Stop   03/28/23 1515  doxycycline (VIBRA-TABS) tablet 100 mg        100 mg Oral Every 12 hours 03/28/23 1501     03/28/23 1000  metroNIDAZOLE (FLAGYL) IVPB 500 mg        500 mg 100 mL/hr over 60 Minutes Intravenous Every 12 hours 03/28/23 0911     03/28/23 1000  cefTRIAXone (ROCEPHIN) 2 g in sodium chloride 0.9 % 100  mL IVPB        2 g 200 mL/hr over 30 Minutes Intravenous Every 24 hours 03/28/23 0942     03/28/23 0100  azithromycin (ZITHROMAX) tablet 500 mg  Status:  Discontinued       Placed in "Followed by" Linked Group   500 mg Oral Daily 03/27/23 0007 03/27/23 0059   03/27/23 1300  doxycycline (VIBRAMYCIN) 100 mg in sodium chloride 0.9 % 250 mL IVPB  Status:  Discontinued        100 mg 125 mL/hr over 120 Minutes Intravenous Every 12 hours 03/27/23 0100 03/28/23 1501   03/27/23 1000  cefTRIAXone (ROCEPHIN) 1 g in sodium chloride 0.9 % 100 mL IVPB  Status:  Discontinued        1 g 200 mL/hr over 30 Minutes Intravenous Every 24 hours 03/27/23 0059 03/28/23 0942   03/27/23 0100  azithromycin (ZITHROMAX) 500 mg in sodium chloride 0.9 % 250 mL IVPB  Status:  Discontinued       Placed in "Followed by" Linked Group   500 mg 250 mL/hr over 60 Minutes Intravenous Every 24 hours 03/27/23 0007 03/27/23 0059   03/26/23 2245  doxycycline (VIBRAMYCIN) 100 mg in sodium chloride 0.9 % 250 mL IVPB        100 mg 125 mL/hr over 120 Minutes Intravenous  Once 03/26/23 2231 03/27/23 0236   03/26/23 2115  cefTRIAXone (ROCEPHIN) 1 g in sodium chloride 0.9 % 100 mL IVPB        1 g 200 mL/hr over 30 Minutes Intravenous  Once 03/26/23 2113 03/26/23 2211   03/26/23 2115  azithromycin (ZITHROMAX) 500 mg in sodium chloride 0.9 % 250 mL IVPB  Status:  Discontinued        500 mg 250 mL/hr over 60 Minutes Intravenous  Once 03/26/23 2113 03/26/23 2231       Subjective: Seen and examined at bedside and she is still feeling short of breath and states that her cough is doing a little bit better though.  Also having some abdominal discomfort and pain.  No lightheadedness or dizziness.  But had some nausea.  No other concerns or complaints at this time.  Objective: Vitals:   03/29/23 0754 03/29/23 0757 03/29/23 1426 03/29/23 1544  BP:    (!) 137/91  Pulse:    84  Resp:    20  Temp:    98.7 F (37.1 C)  TempSrc:    Oral   SpO2: 96% 96% 97% 100%  Weight:      Height:        Intake/Output Summary (Last 24 hours) at 03/29/2023 1639 Last data filed at 03/29/2023 1000 Gross per 24 hour  Intake 480 ml  Output --  Net 480 ml   Filed Weights   03/28/23 0557 03/28/23 0700 03/29/23 0500  Weight: 85.5 kg 88.6 kg 89.8 kg  Examination: Physical Exam:  Constitutional: WN/WD obese chronically ill-appearing Caucasian female who appears a little uncomfortable Respiratory: Diminished to auscultation bilaterally with some coarse breath sounds and has some crackles and some rhonchi.  No appreciable rales.  Wearing supplemental oxygen via nasal cannula at 5 L Cardiovascular: RRR, no murmurs / rubs / gallops. S1 and S2 auscultated.  Trace extra edema Abdomen: Soft, non-tender, distended secondary to body habitus. Bowel sounds positive.  GU: Deferred. Musculoskeletal: No clubbing / cyanosis of digits/nails. No joint deformity upper and lower extremities.  Skin: No rashes, lesions, ulcers on limited skin evaluation. No induration; Warm and dry.  Neurologic: CN 2-12 grossly intact with no focal deficits. Romberg sign and cerebellar reflexes not assessed.  Psychiatric: Normal judgment and insight. Alert and oriented x 3. Normal mood and appropriate affect.   Data Reviewed: I have personally reviewed following labs and imaging studies  CBC: Recent Labs  Lab 03/26/23 2016 03/27/23 0608 03/27/23 1418 03/28/23 0015 03/28/23 2117 03/29/23 0542 03/29/23 0842 03/29/23 1013 03/29/23 1459  WBC 14.9* 12.2* 16.0* 17.4*  --   --   --  14.6*  --   NEUTROABS 12.3* 11.6* 15.0* 16.1*  --   --   --  12.3*  --   HGB 11.2* 9.7* 10.1* 9.9* 10.2* 9.8* 10.6* 11.0* 11.3*  HCT 35.0* 31.5* 32.3* 32.1* 33.4* 32.2* 33.5* 36.1 36.6  MCV 82.5 86.5 85.7 87.0  --   --   --  88.5  --   PLT 234 200 210 217  --   --   --  284  --    Basic Metabolic Panel: Recent Labs  Lab 03/26/23 2016 03/26/23 2117 03/27/23 0608 03/27/23 0835  03/27/23 1418 03/28/23 0015 03/29/23 1013  NA 134*  --  134*  --  136 134* 139  K 2.7*  --  3.2*  --  3.7 4.0 4.1  CL 91*  --  96*  --  102 100 102  CO2 28  --  23  --  23 23 22   GLUCOSE 199*  --  247*  --  137* 198* 163*  BUN 19  --  23  --  20 25* 21  CREATININE 0.99  --  0.93  --  0.65 0.68 0.70  CALCIUM 8.8*  --  8.4*  --  8.5* 9.0 10.3  MG  --  1.9  --  1.7 1.7 2.4 2.2  PHOS  --   --   --  2.3* 2.4* 2.0* 3.3   GFR: Estimated Creatinine Clearance: 59.7 mL/min (by C-G formula based on SCr of 0.7 mg/dL). Liver Function Tests: Recent Labs  Lab 03/27/23 0835 03/27/23 1418 03/28/23 0015 03/29/23 1013  AST 16 16 16 17   ALT 10 10 11 13   ALKPHOS 50 51 48 46  BILITOT 0.8 0.5 0.5 0.6  PROT 6.2* 6.2* 6.0* 6.8  ALBUMIN 3.1* 3.2* 3.2* 3.5   No results for input(s): "LIPASE", "AMYLASE" in the last 168 hours. No results for input(s): "AMMONIA" in the last 168 hours. Coagulation Profile: No results for input(s): "INR", "PROTIME" in the last 168 hours. Cardiac Enzymes: No results for input(s): "CKTOTAL", "CKMB", "CKMBINDEX", "TROPONINI" in the last 168 hours. BNP (last 3 results) No results for input(s): "PROBNP" in the last 8760 hours. HbA1C: No results for input(s): "HGBA1C" in the last 72 hours. CBG: Recent Labs  Lab 03/28/23 1638 03/28/23 2041 03/29/23 0717 03/29/23 1147 03/29/23 1602  GLUCAP 157* 141* 159* 125* 206*   Lipid Profile:  No results for input(s): "CHOL", "HDL", "LDLCALC", "TRIG", "CHOLHDL", "LDLDIRECT" in the last 72 hours. Thyroid Function Tests: No results for input(s): "TSH", "T4TOTAL", "FREET4", "T3FREE", "THYROIDAB" in the last 72 hours. Anemia Panel: Recent Labs    03/28/23 0015  VITAMINB12 535  FOLATE 6.7  FERRITIN 79  TIBC 307  IRON 28  RETICCTPCT 2.4   Sepsis Labs: Recent Labs  Lab 03/27/23 0835 03/27/23 1023 03/28/23 0015 03/28/23 0614 03/28/23 1632 03/28/23 1826  PROCALCITON 0.14  --   --   --   --   --   LATICACIDVEN 3.7*   <  > 2.9* 3.7* 3.6* 2.2*   < > = values in this interval not displayed.   Recent Results (from the past 240 hour(s))  Resp panel by RT-PCR (RSV, Flu A&B, Covid) Anterior Nasal Swab     Status: None   Collection Time: 03/26/23  8:16 PM   Specimen: Anterior Nasal Swab  Result Value Ref Range Status   SARS Coronavirus 2 by RT PCR NEGATIVE NEGATIVE Final    Comment: (NOTE) SARS-CoV-2 target nucleic acids are NOT DETECTED.  The SARS-CoV-2 RNA is generally detectable in upper respiratory specimens during the acute phase of infection. The lowest concentration of SARS-CoV-2 viral copies this assay can detect is 138 copies/mL. A negative result does not preclude SARS-Cov-2 infection and should not be used as the sole basis for treatment or other patient management decisions. A negative result may occur with  improper specimen collection/handling, submission of specimen other than nasopharyngeal swab, presence of viral mutation(s) within the areas targeted by this assay, and inadequate number of viral copies(<138 copies/mL). A negative result must be combined with clinical observations, patient history, and epidemiological information. The expected result is Negative.  Fact Sheet for Patients:  BloggerCourse.com  Fact Sheet for Healthcare Providers:  SeriousBroker.it  This test is no t yet approved or cleared by the [place] FDA and  has been authorized for detection and/or diagnosis of SARS-CoV-2 by FDA under an Emergency Use Authorization (EUA). This EUA will remain  in effect (meaning this test can be used) for the duration of the COVID-19 declaration under Section 564(b)(1) of the Act, 21 U.S.C.section 360bbb-3(b)(1), unless the authorization is terminated  or revoked sooner.       Influenza A by PCR NEGATIVE NEGATIVE Final   Influenza B by PCR NEGATIVE NEGATIVE Final    Comment: (NOTE) The Xpert Xpress SARS-CoV-2/FLU/RSV plus  assay is intended as an aid in the diagnosis of influenza from Nasopharyngeal swab specimens and should not be used as a sole basis for treatment. Nasal washings and aspirates are unacceptable for Xpert Xpress SARS-CoV-2/FLU/RSV testing.  Fact Sheet for Patients: BloggerCourse.com  Fact Sheet for Healthcare Providers: SeriousBroker.it  This test is not yet approved or cleared by the [place] FDA and has been authorized for detection and/or diagnosis of SARS-CoV-2 by FDA under an Emergency Use Authorization (EUA). This EUA will remain in effect (meaning this test can be used) for the duration of the COVID-19 declaration under Section 564(b)(1) of the Act, 21 U.S.C. section 360bbb-3(b)(1), unless the authorization is terminated or revoked.     Resp Syncytial Virus by PCR NEGATIVE NEGATIVE Final    Comment: (NOTE) Fact Sheet for Patients: BloggerCourse.com  Fact Sheet for Healthcare Providers: SeriousBroker.it  This test is not yet approved or cleared by the [place] FDA and has been authorized for detection and/or diagnosis of SARS-CoV-2 by FDA under an Emergency Use Authorization (EUA). This EUA  will remain in effect (meaning this test can be used) for the duration of the COVID-19 declaration under Section 564(b)(1) of the Act, 21 U.S.C. section 360bbb-3(b)(1), unless the authorization is terminated or revoked.  Performed at [facility], [address] [place], [place] [address]   Blood culture (routine x 2)     Status: None (Preliminary result)   Collection Time: 03/26/23  8:20 PM   Specimen: BLOOD  Result Value Ref Range Status   Specimen Description   Final    BLOOD LEFT ANTECUBITAL Performed at [facility], [address] [place], [place] [address]    Special Requests   Final    BOTTLES DRAWN AEROBIC AND ANAEROBIC  Blood Culture results may not be optimal due to an excessive volume of blood received in culture bottles Performed at [facility], [address] [place], [place] [address]    Culture   Final    NO GROWTH 2 DAYS Performed at [facility], [address] [place], [place] [address]    Report Status PENDING  Incomplete  Blood culture (routine x 2)     Status: None (Preliminary result)   Collection Time: 03/26/23  8:30 PM   Specimen: BLOOD  Result Value Ref Range Status   Specimen Description   Final    BLOOD RIGHT ANTECUBITAL Performed at [facility], [address], [place], [place] [address]    Special Requests   Final    BOTTLES DRAWN AEROBIC AND ANAEROBIC Blood Culture adequate volume Performed at [facility], [address] [place], [place] [address]    Culture   Final    NO GROWTH 2 DAYS Performed at [facility], [address] [place], [place] [address]    Report Status PENDING  Incomplete  Respiratory (~20 pathogens) panel by PCR     Status: None   Collection Time: 03/27/23  1:02 PM   Specimen: Nasopharyngeal Swab; Respiratory  Result Value Ref Range Status   Adenovirus NOT DETECTED NOT DETECTED Final   Coronavirus 229E NOT DETECTED NOT DETECTED Final    Comment: (NOTE) The Coronavirus on the Respiratory Panel, DOES NOT test for the novel  Coronavirus (2019 nCoV)    Coronavirus HKU1 NOT DETECTED NOT DETECTED Final   Coronavirus NL63 NOT DETECTED NOT DETECTED Final   Coronavirus OC43 NOT DETECTED NOT DETECTED Final   Metapneumovirus NOT DETECTED NOT DETECTED Final   Rhinovirus / Enterovirus NOT DETECTED NOT DETECTED Final   Influenza A NOT DETECTED NOT DETECTED Final   Influenza B NOT DETECTED NOT DETECTED Final   Parainfluenza Virus 1 NOT DETECTED NOT DETECTED Final   Parainfluenza Virus 2 NOT DETECTED NOT DETECTED Final   Parainfluenza Virus 3 NOT DETECTED NOT DETECTED Final    Parainfluenza Virus 4 NOT DETECTED NOT DETECTED Final   Respiratory Syncytial Virus NOT DETECTED NOT DETECTED Final   Bordetella pertussis NOT DETECTED NOT DETECTED Final   Bordetella Parapertussis NOT DETECTED NOT DETECTED Final   Chlamydophila pneumoniae NOT DETECTED NOT DETECTED Final   Mycoplasma pneumoniae NOT DETECTED NOT DETECTED Final    Comment: Performed at [facility], [address], [place], [place] [address]    Radiology Studies: DG CHEST PORT 1 VIEW  Result Date: 03/28/2023 CLINICAL DATA:  Shortness of breath. EXAM: PORTABLE CHEST 1 VIEW COMPARISON:  One-view chest x-ray 03/27/2023 FINDINGS: The heart is enlarged. Atherosclerotic calcifications are present at the aortic arch. Mild pulmonary vascular congestion is present.  No focal airspace disease is present. Pacing wires are stable. Advanced degenerative changes are present in the shoulders, right greater than left. IMPRESSION: 1. Cardiomegaly and mild pulmonary vascular congestion without frank edema. 2. Aortic atherosclerosis. Electronically Signed   By: Marin Roberts M.D.   On: 03/28/2023 10:26    Scheduled Meds:  dextromethorphan-guaiFENesin  1 tablet Oral BID   dofetilide  250 mcg Oral BID   doxycycline  100 mg Oral Q12H   fluticasone furoate-vilanterol  1 puff Inhalation Daily   gabapentin  200 mg Oral QHS   insulin aspart  0-15 Units Subcutaneous TID WC   insulin aspart  0-5 Units Subcutaneous QHS   levalbuterol  0.63 mg Nebulization Q6H   losartan  12.5 mg Oral Daily   methylPREDNISolone (SOLU-MEDROL) injection  40 mg Intravenous Q12H   potassium chloride SA  40 mEq Oral Daily   revefenacin  175 mcg Nebulization Daily   rivaroxaban  20 mg Oral Q supper   umeclidinium bromide  1 puff Inhalation Daily   Continuous Infusions:  cefTRIAXone (ROCEPHIN)  IV 2 g (03/29/23 1022)   metronidazole 500 mg (03/29/23 1413)    LOS: 3 days   Marguerita Merles, DO [facility] Available via Epic secure chat  7am-7pm After these hours, please refer to coverage provider listed on [email or web address] 03/29/2023, 4:39 PM

## 2023-03-30 ENCOUNTER — Inpatient Hospital Stay (HOSPITAL_COMMUNITY): Payer: Medicare Other

## 2023-03-30 ENCOUNTER — Encounter (HOSPITAL_COMMUNITY): Payer: Self-pay | Admitting: Family Medicine

## 2023-03-30 ENCOUNTER — Other Ambulatory Visit: Payer: Self-pay | Admitting: Internal Medicine

## 2023-03-30 DIAGNOSIS — E039 Hypothyroidism, unspecified: Secondary | ICD-10-CM | POA: Diagnosis not present

## 2023-03-30 DIAGNOSIS — I480 Paroxysmal atrial fibrillation: Secondary | ICD-10-CM | POA: Diagnosis not present

## 2023-03-30 DIAGNOSIS — N824 Other female intestinal-genital tract fistulae: Secondary | ICD-10-CM | POA: Diagnosis not present

## 2023-03-30 DIAGNOSIS — J441 Chronic obstructive pulmonary disease with (acute) exacerbation: Secondary | ICD-10-CM | POA: Diagnosis not present

## 2023-03-30 DIAGNOSIS — Z950 Presence of cardiac pacemaker: Secondary | ICD-10-CM | POA: Diagnosis not present

## 2023-03-30 LAB — CBC WITH DIFFERENTIAL/PLATELET
Abs Immature Granulocytes: 0.8 10*3/uL — ABNORMAL HIGH (ref 0.00–0.07)
Basophils Absolute: 0.1 10*3/uL (ref 0.0–0.1)
Basophils Relative: 1 %
Eosinophils Absolute: 0 10*3/uL (ref 0.0–0.5)
Eosinophils Relative: 0 %
HCT: 33.6 % — ABNORMAL LOW (ref 36.0–46.0)
Hemoglobin: 10.3 g/dL — ABNORMAL LOW (ref 12.0–15.0)
Immature Granulocytes: 7 %
Lymphocytes Relative: 7 %
Lymphs Abs: 0.9 10*3/uL (ref 0.7–4.0)
MCH: 26.7 pg (ref 26.0–34.0)
MCHC: 30.7 g/dL (ref 30.0–36.0)
MCV: 87 fL (ref 80.0–100.0)
Monocytes Absolute: 0.5 10*3/uL (ref 0.1–1.0)
Monocytes Relative: 4 %
Neutro Abs: 10.1 10*3/uL — ABNORMAL HIGH (ref 1.7–7.7)
Neutrophils Relative %: 81 %
Platelets: 240 10*3/uL (ref 150–400)
RBC: 3.86 MIL/uL — ABNORMAL LOW (ref 3.87–5.11)
RDW: 14.1 % (ref 11.5–15.5)
WBC: 12.3 10*3/uL — ABNORMAL HIGH (ref 4.0–10.5)
nRBC: 0.2 % (ref 0.0–0.2)

## 2023-03-30 LAB — COMPREHENSIVE METABOLIC PANEL
ALT: 14 U/L (ref 0–44)
AST: 18 U/L (ref 15–41)
Albumin: 3.1 g/dL — ABNORMAL LOW (ref 3.5–5.0)
Alkaline Phosphatase: 39 U/L (ref 38–126)
Anion gap: 10 (ref 5–15)
BUN: 22 mg/dL (ref 8–23)
CO2: 24 mmol/L (ref 22–32)
Calcium: 9.8 mg/dL (ref 8.9–10.3)
Chloride: 105 mmol/L (ref 98–111)
Creatinine, Ser: 0.62 mg/dL (ref 0.44–1.00)
GFR, Estimated: >60 mL/min (ref >60)
Glucose, Bld: 173 mg/dL — ABNORMAL HIGH (ref 70–99)
Potassium: 4.6 mmol/L (ref 3.5–5.1)
Sodium: 139 mmol/L (ref 135–145)
Total Bilirubin: 0.4 mg/dL (ref 0.3–1.2)
Total Protein: 5.7 g/dL — ABNORMAL LOW (ref 6.5–8.1)

## 2023-03-30 LAB — PHOSPHORUS: Phosphorus: 4 mg/dL (ref 2.5–4.6)

## 2023-03-30 LAB — GLUCOSE, CAPILLARY
Glucose-Capillary: 154 mg/dL — ABNORMAL HIGH (ref 70–99)
Glucose-Capillary: 159 mg/dL — ABNORMAL HIGH (ref 70–99)
Glucose-Capillary: 197 mg/dL — ABNORMAL HIGH (ref 70–99)
Glucose-Capillary: 130 mg/dL — ABNORMAL HIGH (ref 70–99)

## 2023-03-30 LAB — MAGNESIUM: Magnesium: 2.1 mg/dL (ref 1.7–2.4)

## 2023-03-30 MED ORDER — ACETAMINOPHEN 325 MG PO TABS
650.0000 mg | ORAL_TABLET | Freq: Once | ORAL | Status: AC
  Administered 2023-03-30: 650 mg via ORAL
  Filled 2023-03-30: qty 2

## 2023-03-30 NOTE — Progress Notes (Addendum)
Patient ID: Kathleen [name], female   DOB: September 01, 1945, 77 y.o.   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   Objective   Vital signs in last 24 hours: Temp:  [97.6 F (36.4 C)-98.7 F (37.1 C)] 97.6 F (36.4 C) (10/22 0546) Pulse Rate:  [60-84] 71 (10/22 0546) Resp:  [18-20] 18 (10/22 0546) BP: (122-137)/(60-91) 122/60 (10/22 0546) SpO2:  [97 %-100 %] 97 % (10/22 0741) Weight:  [90.9 kg] 90.9 kg (10/22 0500) Last BM Date : 03/30/23 General: chronically ill appearing elderly WF    in NAD, harsh deep cough Heart:  Regular rate and rhythm; no murmurs Lungs: Respirations even and unlabored, decreased breath sounds bilaterally Abdomen:  Soft, obese , there is mild tenderness in the left lower quadrant, no guarding or rebound nondistended. Normal bowel sounds. Extremities:  Without edema. Neurologic:  Alert and oriented,  grossly normal neurologically. Psych:  Cooperative. Normal mood and affect.  Intake/Output from previous day: 10/21 0701 - 10/22 0700 In: 1360 [P.O.:960; IV Piggyback:400] Out: -  Intake/Output this shift: No intake/output data recorded.  Lab Results: Recent Labs    03/28/23 0015 03/28/23 2117 03/29/23 1013 03/29/23 1459 03/29/23 2131 03/30/23 0708  WBC 17.4*  --  14.6*  --   --  12.3*  HGB 9.9*   < > 11.0* 11.3* 11.1* 10.3*  HCT 32.1*   < > 36.1 36.6 35.3* 33.6*  PLT 217  --  284  --   --  240   < > = values in this interval not displayed.   BMET Recent Labs    03/28/23 0015 03/29/23 1013 03/30/23 0708  NA 134* 139 139  K 4.0  4.1 4.6  CL 100 102 105  CO2 23 22 24   GLUCOSE 198* 163* 173*  BUN 25* 21 22  CREATININE 0.68 0.70 0.62  CALCIUM 9.0 10.3 9.8   LFT Recent Labs    03/30/23 0708  PROT 5.7*  ALBUMIN 3.1*  AST 18  ALT 14  ALKPHOS 39  BILITOT 0.4   PT/INR No results for input(s): "LABPROT", "INR" in the last 72 hours.  Studies/Results: DG CHEST PORT 1 VIEW  Result Date: 03/29/2023 CLINICAL DATA:  Shortness of breath. EXAM: PORTABLE CHEST 1 VIEW COMPARISON:  03/28/2023 FINDINGS: The cardio pericardial silhouette is enlarged. Bibasilar atelectasis with possible tiny bilateral pleural effusions. No pulmonary edema. Left-sided permanent pacemaker again noted. Degenerative changes noted right shoulder. Telemetry leads overlie the chest. IMPRESSION: Bibasilar atelectasis with possible tiny bilateral pleural effusions. Electronically Signed   By: Kennith Center M.D.   On: 03/29/2023 19:10       Assessment / Plan:    #38 77 year old female with persistent acute sigmoid diverticulitis, complicated by colovaginal fistula initially recognized September 2024 with hospitalization for acute diverticulitis. Says her symptoms never completely resolved so suspect this is an ongoing persistence of the initial episode of diverticulitis onset September 2024.  She has been on IV Rocephin, metronidazole, and doxycycline since admission to cover respiratory symptoms.  This should also be adequate to cover her acute diverticulitis. She is actually  feeling better today and has not had any further rectal bleeding  Hemoglobin stable at 11.3   She has also been on IV steroids which may mask some of her intra-abdominal/diverticulitis symptoms  #2 hematochezia-self-limited-may have been diverticular or secondary to friability/ulceration associated with the fistula in setting of Xarelto Continue to monitor  #3 acute on chronic respiratory failure on chronic O2 at 5 L at baseline, currently being treated for pneumonia,  tracheomalacia, COPD exacerbation  #4 sleep apnea-BiPAP use #5 prolonged QT #6.  History of atrial fibrillation-status post pacemaker #7.  Congestive heart failure with EF 35 to 40% #8 history of iron deficiency anemia  Plan; As she is improved today, plan to continue current antibiotic regimen Plan to reimage with CT on Thursday which will be about 1 week after initiation of IV antibiotics Pulmonary planning on continuing IV Solu-Medrol for a couple more days She is a very high risk candidate for endoscopic or surgical intervention scheduled for colonoscopy with Dr. Meridee Score on 05/03/2023--this will need to be discussed further with surgery as would need to know that surgery would be on board for potential diverting colostomy for disease patient does have outpatient appointment scheduled with Dr. 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 I performed a substantive portion of this encounter, including complete performance of at least one of the key components, in conjunction with the APP. I agree with the Advanced Practitioner's note, impression and recommendations.   Doing much better on IV Rocephin/metronidazole/Doxy (day 4) Tolerating p.o. well.  Abdo pain is much better  Plan: -Repeat CT Abdo/pelvis 10/24  -Continue antibiotics. -Rest of  GI [facility] as outpt   Edman Circle, MD [facility] [phone]

## 2023-03-30 NOTE — Telephone Encounter (Signed)
Last Fill: Last filled by our office on 12/30/2022, was discontinued by Jerald Kief, MD.  Was filled on 01/11/2023 by Rometta Emery, MD and is discontinued.   Labs: 03/30/2023  WBC 12.3 RBC 3.86 Hemoglobin 10.3 HCT 33.6 Neutro Abs 10.1 Abs Immature Garnulocytes 0.80 Glucose 173 Total Protein 5.7 Albumin 3.1  01/27/2023 Uric Acid 7.3  Next Visit: 04/12/2023  Last Visit: 01/27/2023  DX: Acute idiopathic gout of left hand   Current Dose per office note 01/27/2023: Uloric 40 mg daily.   Patient is currently admitted to the hospital.   Okay to refill Uloric?

## 2023-03-30 NOTE — Progress Notes (Signed)
NAME:  Kathleen [name], MRN:  [id number], DOB:  April 22, 1946, LOS: 4 ADMISSION DATE:  03/26/2023, CONSULTATION DATE:  03/29/2023 REFERRING MD:  Dr. Marland Mcalpine - [facility], CHIEF COMPLAINT:  Acute respiratory distress    History of Present Illness:  Kathleen [name] is a 77 y.o. with a past medical history significant for Boop, OSA on BIPAP, COPD, chronic hypoxic respiratory failure on 5 L nasal cannula at baseline, spontaneous pneumothorax, asthma CHF, complete heart block now s/pacemaker, HFrEF, HTN, atrial fibrillation, and colovesicular fistula who presented to the ED 10/18 with complaint of nonproductive cough with wheezing and low-grade fever.  Pertinent  Medical History  Boop, OSA on BIPAP, COPD, chronic hypoxic respiratory failure on 5 L nasal cannula at baseline, spontaneous pneumothorax, asthma CHF, complete heart block now s/pacemaker, HFrEF, HTN, atrial fibrillation, and colovesicular fistula   Significant Hospital Events: Including procedures, antibiotic start and stop dates in addition to other pertinent events   10/18 presented with both GI and respiratory complaints with extensive pulmonary history 10/21 PCCM consulted   Interim History / Subjective:  Feeling a little better.  Cough about the same. This is her biggest complaint.  Feels as though she is coughing up her inhaler medication and not receiving the full benefit.   Objective   Blood pressure 122/60, pulse 71, temperature 97.6 F (36.4 C), temperature source Oral, resp. rate 18, height 5' (1.524 m), weight 90.9 kg, SpO2 97%.        Intake/Output Summary (Last 24 hours) at 03/30/2023 0918 Last data filed at 03/30/2023 0640 Gross per 24 hour  Intake 1360 ml  Output --  Net 1360 ml   Filed Weights   03/28/23 0700 03/29/23 0500 03/30/23 0500  Weight: 88.6 kg 89.8 kg 90.9 kg    Examination: General: Elderly appearing female in NAD HEENT: [place]/AT, PERRL, no JVD Neuro: Alert, oriented, non-focal CV: RRR, no MRG PULM:  Wheeze noted in upper apices bilaterally and rhonchi in bilateral bases. Frequent cough. No production noted.  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  - Tussoinex - Incentive spirometry and flutter valve - Doxy and Flagyl  > GI suggested possibly transitioning to Zosyn, per primary.  - Weight is up, BNP low. Defer diuretics for now.   Other problems per primary - Acute diverticulitis - Colovaginal fistula  Best Practice (right click and "Reselect all SmartList Selections" daily)  Per primary   Critical care time: NA    Joneen Roach, AGACNP-BC [facility]  See Amion for personal pager PCCM on call pager [phone] until 7pm. Please call Elink 7p-7a. [phone]  03/30/2023 9:43 AM

## 2023-03-30 NOTE — Progress Notes (Signed)
Occupational Therapy Treatment Patient Details Name: Kathleen [name] MRN: [id number] DOB: Jul 08, 1945 Today's Date: 03/30/2023   History of present illness The pt is a 77 yo female presenting 10/18 with Acute exacerbation COPD, Pneumonia, lactic acidosis, severe sepsis   Chronic respiratory failure with hypoxia. Pt with admission last month to [facility] with brown stool/discharge from vagina x1 week. 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  August previous admission to [facility] for COPD exacerbation.  PMH includes: COPD on 4-5L O2, asthma, OSA on bipap, afib, CHF.   OT comments  Patient was able to engage in functional activity for over 12 minutes making progress towards goal. Patient indicated plan was to d/c back to ILF with [facility] services at time of d/c. Patient would continue to benefit from skilled OT services to work on functional activity tolerance and O2 cord management to increase safety with transition back to ILF. Patient's discharge plan remains appropriate at this time. OT will continue to follow acutely.        If plan is discharge home, recommend the following:  A little help with bathing/dressing/bathroom;Assistance with cooking/housework;Assist for transportation   Equipment Recommendations  None recommended by OT       Precautions / Restrictions Precautions Precautions: Fall Precaution Comments: Keep SpO2 above 88% Restrictions Weight Bearing Restrictions: No Other Position/Activity Restrictions: Fragile skin       Mobility Bed Mobility Overal bed mobility: Modified Independent                       Balance Overall balance assessment: Mild deficits observed, not formally tested           ADL either performed or assessed with clinical judgement   ADL Overall ADL's : Needs assistance/impaired     Grooming: Wash/dry face;Wash/dry hands;Supervision/safety;Standing Grooming Details (indicate cue type and reason): with increased time.                  Toilet Transfer: Comfort height toilet;Ambulation;Supervision/safety Toilet Transfer Details (indicate cue type and reason): with patient reaching out to touch all surfaces in the bathroom. unsteady Toileting- Architect and Hygiene: Supervision/safety;Sitting/lateral lean Toileting - Clothing Manipulation Details (indicate cue type and reason): with increased time and effort with fistula.       General ADL Comments: patient reported that she lives at [facility] and that she would not have anyone to help her at home. patient reported that she would be able to eat meals at facility but that they dont taste good. patient was educated on heating up meals from freezer and using alternative methods for meal prep task. patient verbalized understanding.      Cognition Arousal: Alert Behavior During Therapy: WFL for tasks assessed/performed Overall Cognitive Status: Within Functional Limits for tasks assessed                           Pertinent Vitals/ Pain       Pain Assessment Pain Assessment: No/denies pain         Frequency  Min 1X/week        Progress Toward Goals  OT Goals(current goals can now be found in the care plan section)  Progress towards OT goals: Progressing toward goals     Plan         AM-PAC OT "6 Clicks" Daily Activity     Outcome Measure   Help from another person eating meals?: None Help from another person  taking care of personal grooming?: A Little Help from another person toileting, which includes using toliet, bedpan, or urinal?: A Little Help from another person bathing (including washing, rinsing, drying)?: A Little Help from another person to put on and taking off regular upper body clothing?: None Help from another person to put on and taking off regular lower body clothing?: A Little 6 Click Score: 20    End of Session Equipment Utilized During Treatment: Oxygen  OT Visit Diagnosis: Unsteadiness on feet  (R26.81)   Activity Tolerance Patient tolerated treatment well   Patient Left in bed;with call bell/phone within reach   Nurse Communication Mobility status        Time: 3474-2595 OT Time Calculation (min): 28 min  Charges: OT General Charges $OT Visit: 1 Visit OT Treatments $Self Care/Home Management : 23-37 mins  Rosalio Loud, MS Acute Rehabilitation Department Office# [phone]   Selinda Flavin 03/30/2023, 12:19 PM

## 2023-03-30 NOTE — Progress Notes (Signed)
Patient c/o pain in abdomen 7/10 and requesting a pain medication not as strong as morphine. Notified provider. New order for one-time dose of tylenol. Will give and continue to monitor.

## 2023-03-30 NOTE — Progress Notes (Signed)
PROGRESS NOTE    Kathleen [name]  HKV:[id number] DOB: 30-Sep-1945 DOA: 03/26/2023 PCP: [name], MD   Brief Narrative:  HPI per Dr. Gery Pray on 03/27/23  This is a 77 year old female with past medical history of OSA (on BiPAP), BOOP (noncandidate for stenting), COPD, chronic respiratory failure 5L, HFrEF, A. fib (s/p AVN ablation, BiV, on anticoagulation), HTN, gout, and colovesical fistula.  On Tuesday and Wednesday patient developed a dry, rhonchorous, racking nonproductive cough.  She developed wheezing, on and off fevers, Tmax 100 degrees at home.  She is a night she was diaphoretic, soaking nurse clothing and sheets.  Per patient on Wednesday she was called by cardiologist and told to take her Metalazone dose, 30 min later take her torsemide.  She was to have repeated this 2 days.  This was called based on I thought she was fluid overloaded.  The patient is followed by the [facility] and per patient they are able to remotely evaluate her fluid status. (Telephone conversation cardiologist NP noted on the 11th, without directions for additional diuretic usage).  Her son took her to [facility] ER.   In the ER patient Tmax 102.5, vitals otherwise stable.  Patient wheezing on presentation.  Potassium 2.7, magnesium normal.  BNP 48.5, lactic acid 2.7 ==> 6.1.  CXR cardiomegaly with hazy bibasilar atelectasis or infiltrates. Blood cultures x 2 collected, Solu-Medrol, Rocephin, IV doxycycline, Tylenol, 2 L NS, 40 meq K given in the ER.  She is accepted in transfer.  **Interim History  She developed abdominal pain on left side but she does have acute diverticulitis noted on her most recent scan so we will added Flagyl.  She continues to have a significant cough and have added doxycycline.  We are ordering pulmonary toileting and lactic acid level was elevated but is trending down now.  Will continue antibiotic coverage currently and she is improving.  GI and pulm have consulted and is  planning on repeating a CT scan of the abdomen pelvis on Thursday and pulmonary repeated a CT scan of the chest yesterday  Assessment and Plan:  Acute exacerbation of COPD wih Pneumonia  Lactic Acidosis  Severe sepsis in the setting of Acute Diverticulitis with associated Colo vaginal fistula Acute on chronic chronic respiratory failure with hypoxia (HCC) -COPD order set/pneumonia order set initiated -Oxygen keep sats greater than 88% SpO2: 99 % O2 Flow Rate (L/min): 5 L/min FiO2 (%): 40 % -Scheduled and as needed nebulizers ordered -Continue IV Solu-Medrol and place on 40 mg q12h for now and Pulmonary recommending continuing for 3 days total -Mucinex DM scheduled, Hycodan as needed.  (Patient states Jerilynn Som has not been working) -Facilities manager ordered along with Flutter Valve -WBC peaked at 17.4 and now improved to 12.3 and Lactic Acid Trend: Recent Labs  Lab 03/27/23 1023 03/27/23 1803 03/27/23 2026 03/28/23 0015 03/28/23 0614 03/28/23 1632 03/28/23 1826  LATICACIDVEN 4.6* 3.6* 3.9* 2.9* 3.7* 3.6* 2.2*  -IV fluid hydration now stopped -Elevated lactic level may also be due to dehydration and infection.   -COVID/Flu/RSV panel negative and Respiratory Virus Panel Negative -Procalcitonin level was 0.14 -Pulmonary consulted and recommending changing Hycodan to Tussionex and elevating the head of the bed 30 degrees and ensuring pulmonary hygiene. They have also placed on aspiration precautions and are obtaining a CT chest and it showed "No acute airspace opacity. Dilated main pulmonary artery, which can be seen in the setting of pulmonary hypertension.Coronary artery calcifications, aortic Atherosclerosis  Emphysema. Cardiomegaly." -Gastroenterology  consulted and she is a very high risk for any endoscopic surgical intervention and cannot undergo colonoscopy because she still has a persistent diverticulitis.;  She is scheduled tentatively for colonoscopy on 05/03/2023 and  GI recommending continue antibiotics at this time and they are recommending limiting steroids if able and stopping when okay from a respiratory standpoint: They are planning on repeating a CT scan of the abdomen pelvis on 10/24   Hypokalemia -Patient's K+ Level Trend: Recent Labs  Lab 03/22/23 1050 03/26/23 2016 03/27/23 0608 03/27/23 1418 03/28/23 0015 03/29/23 1013 03/30/23 0708  K 4.0 2.7* 3.2* 3.7 4.0 4.1 4.6  -Continue to Monitor and Replete as Necessary -Repeat CMP in the AM   Prolonged QT -Was 539 -Repleted potassium.  Repeat EKG once potassium normalized -Avoid QT prolongation agents   Hypercoagulable state due to persistent atrial fibrillation (HCC) -On Tikosyn and Xarelto, continued -Will keep magnesium and potassium midrange -May need to hold her Xarelto given her bloody stools   Chronic systolic heart failure (HCC) -BNP 48.  Hold Demadex and metolazone until potassium normalizes.  Hydrating with elevated lactic acid.  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 Persistent acute sigmoid diverticulitis is identified as well. No discrete abscess is noted. Stable 5 mm nodule in the right lower lobe. No  follow-up needed if patient is low-risk.  Non-contrast chest CT can be considered in 12 months if patient is high-risk." -Will need outpatient Colonoscopy and scheduled for 05/03/23 -GI recommending repeating a CT of the abdomen pelvis on 04/01/2023 and continue antibiotics with Rocephin, metronidazole and doxycycline  Lung Nodule -Stable at 5 mm and Needs to be followed in the outpatient setting  Normocytic Anemia -Hgb/Hct Trend: Recent Labs  Lab 03/28/23 2117 03/29/23 0542 03/29/23 0842 03/29/23 1013 03/29/23 1459 03/29/23 2131 03/30/23 0708  HGB 10.2* 9.8* 10.6* 11.0* 11.3* 11.1* 10.3*  HCT 33.4* 32.2* 33.5* 36.1 36.6 35.3* 33.6*  MCV  --   --   --  88.5  --   --  87.0  -Checked Anemia Panel and showed an iron level of 28, UIBC 279, TIBC 307, saturation ratio 9%, ferritin of 79, folate level 6.7 and vitamin B12 535 -Continue to Monitor for S/Sx of Bleeding had bloody stool this morning so we will check FOBT; Remains on Xarelto but may need to hold  -Will discuss with GI in the morning and trend H&H as every 6 -Repeat CBC in the AM  Hypoalbuminemia -Patient's Albumin Trending from 3.1-3.5 -Continue to Monitor and Trend and repeat CMP in the AM  Obesity -Complicates overall prognosis and care -Estimated body mass index is 39.14 kg/m as calculated from the following:   Height as of this encounter: 5' (1.524 m).   Weight as  of this encounter: 90.9 kg.  -Weight Loss and Dietary Counseling given   DVT prophylaxis:  rivaroxaban (XARELTO) tablet 20 mg    Code Status: Prior Family Communication: No family present at bedside   Disposition Plan:  Level of care: Telemetry Status is: Inpatient Remains inpatient appropriate because: Needs further clinical improvement and clearance by the specialists.   Consultants:  Pulmonary Gastroenterology  Procedures:  As delineated as above  Antimicrobials:  Anti-infectives (From admission, onward)    Start     Dose/Rate Route  Frequency Ordered Stop   03/28/23 1515  doxycycline (VIBRA-TABS) tablet 100 mg        100 mg Oral Every 12 hours 03/28/23 1501     03/28/23 1000  metroNIDAZOLE (FLAGYL) IVPB 500 mg        500 mg 100 mL/hr over 60 Minutes Intravenous Every 12 hours 03/28/23 0911     03/28/23 1000  cefTRIAXone (ROCEPHIN) 2 g in sodium chloride 0.9 % 100 mL IVPB        2 g 200 mL/hr over 30 Minutes Intravenous Every 24 hours 03/28/23 0942     03/28/23 0100  azithromycin (ZITHROMAX) tablet 500 mg  Status:  Discontinued       Placed in "Followed by" Linked Group   500 mg Oral Daily 03/27/23 0007 03/27/23 0059   03/27/23 1300  doxycycline (VIBRAMYCIN) 100 mg in sodium chloride 0.9 % 250 mL IVPB  Status:  Discontinued        100 mg 125 mL/hr over 120 Minutes Intravenous Every 12 hours 03/27/23 0100 03/28/23 1501   03/27/23 1000  cefTRIAXone (ROCEPHIN) 1 g in sodium chloride 0.9 % 100 mL IVPB  Status:  Discontinued        1 g 200 mL/hr over 30 Minutes Intravenous Every 24 hours 03/27/23 0059 03/28/23 0942   03/27/23 0100  azithromycin (ZITHROMAX) 500 mg in sodium chloride 0.9 % 250 mL IVPB  Status:  Discontinued       Placed in "Followed by" Linked Group   500 mg 250 mL/hr over 60 Minutes Intravenous Every 24 hours 03/27/23 0007 03/27/23 0059   03/26/23 2245  doxycycline (VIBRAMYCIN) 100 mg in sodium chloride 0.9 % 250 mL IVPB        100 mg 125 mL/hr over 120 Minutes Intravenous  Once 03/26/23 2231 03/27/23 0236   03/26/23 2115  cefTRIAXone (ROCEPHIN) 1 g in sodium chloride 0.9 % 100 mL IVPB        1 g 200 mL/hr over 30 Minutes Intravenous  Once 03/26/23 2113 03/26/23 2211   03/26/23 2115  azithromycin (ZITHROMAX) 500 mg in sodium chloride 0.9 % 250 mL IVPB  Status:  Discontinued        500 mg 250 mL/hr over 60 Minutes Intravenous  Once 03/26/23 2113 03/26/23 2231       Subjective: Seen and examined at bedside and she was doing okay.  Still coughing quite a bit but thinks is slowly improving.  States  abdominal discomfort is slowly improving and not as bad today.  Has not had any more bloody bowel movements.  No other concerns or complaints at this time.  Objective: Vitals:   03/30/23 0546 03/30/23 0741 03/30/23 1311 03/30/23 1522  BP: 122/60  (!) 157/79   Pulse: 71  72   Resp: 18  16   Temp: 97.6 F (36.4 C)  98.2 F (36.8 C)   TempSrc: Oral  Oral   SpO2: 98% 97% 99% 99%  Weight:  Height:        Intake/Output Summary (Last 24 hours) at 03/30/2023 1653 Last data filed at 03/30/2023 1100 Gross per 24 hour  Intake 1000 ml  Output --  Net 1000 ml   Filed Weights   03/28/23 0700 03/29/23 0500 03/30/23 0500  Weight: 88.6 kg 89.8 kg 90.9 kg   Examination: Physical Exam:  Constitutional: WN/WD appearing Caucasian female in no acute distress Respiratory: Diminished to auscultation bilaterally, no wheezing, rales, rhonchi or crackles. Normal respiratory effort and patient is not tachypenic. No accessory muscle use.  Unlabored breathing Cardiovascular: RRR, no murmurs / rubs / gallops. S1 and S2 auscultated.  Trace extremity edema.  Abdomen: Soft, a little-tender, distended secondary to body habitus. Bowel sounds positive.  GU: Deferred. Musculoskeletal: No clubbing / cyanosis of digits/nails. No joint deformity upper and lower extremities.  Skin: No rashes, lesions, ulcers limited skin evaluation. No induration; Warm and dry.  Neurologic: CN 2-12 grossly intact with no focal deficits. Romberg sign and cerebellar reflexes not assessed.  Psychiatric: Normal judgment and insight. Alert and oriented x 3. Normal mood and appropriate affect.   Data Reviewed: I have personally reviewed following labs and imaging studies  CBC: Recent Labs  Lab 03/27/23 0608 03/27/23 1418 03/28/23 0015 03/28/23 2117 03/29/23 0842 03/29/23 1013 03/29/23 1459 03/29/23 2131 03/30/23 0708  WBC 12.2* 16.0* 17.4*  --   --  14.6*  --   --  12.3*  NEUTROABS 11.6* 15.0* 16.1*  --   --  12.3*  --    --  10.1*  HGB 9.7* 10.1* 9.9*   < > 10.6* 11.0* 11.3* 11.1* 10.3*  HCT 31.5* 32.3* 32.1*   < > 33.5* 36.1 36.6 35.3* 33.6*  MCV 86.5 85.7 87.0  --   --  88.5  --   --  87.0  PLT 200 210 217  --   --  284  --   --  240   < > = values in this interval not displayed.   Basic Metabolic Panel: Recent Labs  Lab 03/27/23 0608 03/27/23 0835 03/27/23 1418 03/28/23 0015 03/29/23 1013 03/30/23 0708  NA 134*  --  136 134* 139 139  K 3.2*  --  3.7 4.0 4.1 4.6  CL 96*  --  102 100 102 105  CO2 23  --  23 23 22 24   GLUCOSE 247*  --  137* 198* 163* 173*  BUN 23  --  20 25* 21 22  CREATININE 0.93  --  0.65 0.68 0.70 0.62  CALCIUM 8.4*  --  8.5* 9.0 10.3 9.8  MG  --  1.7 1.7 2.4 2.2 2.1  PHOS  --  2.3* 2.4* 2.0* 3.3 4.0   GFR: Estimated Creatinine Clearance: 60.2 mL/min (by C-G formula based on SCr of 0.62 mg/dL). Liver Function Tests: Recent Labs  Lab 03/27/23 0835 03/27/23 1418 03/28/23 0015 03/29/23 1013 03/30/23 0708  AST 16 16 16 17 18   ALT 10 10 11 13 14   ALKPHOS 50 51 48 46 39  BILITOT 0.8 0.5 0.5 0.6 0.4  PROT 6.2* 6.2* 6.0* 6.8 5.7*  ALBUMIN 3.1* 3.2* 3.2* 3.5 3.1*   No results for input(s): "LIPASE", "AMYLASE" in the last 168 hours. No results for input(s): "AMMONIA" in the last 168 hours. Coagulation Profile: No results for input(s): "INR", "PROTIME" in the last 168 hours. Cardiac Enzymes: No results for input(s): "CKTOTAL", "CKMB", "CKMBINDEX", "TROPONINI" in the last 168 hours. BNP (last 3 results) No results for input(s): "  PROBNP" in the last 8760 hours. HbA1C: No results for input(s): "HGBA1C" in the last 72 hours. CBG: Recent Labs  Lab 03/29/23 1602 03/29/23 2120 03/30/23 0805 03/30/23 1143 03/30/23 1613  GLUCAP 206* 138* 154* 159* 197*   Lipid Profile: No results for input(s): "CHOL", "HDL", "LDLCALC", "TRIG", "CHOLHDL", "LDLDIRECT" in the last 72 hours. Thyroid Function Tests: No results for input(s): "TSH", "T4TOTAL", "FREET4", "T3FREE",  "THYROIDAB" in the last 72 hours. Anemia Panel: Recent Labs    03/28/23 0015  VITAMINB12 535  FOLATE 6.7  FERRITIN 79  TIBC 307  IRON 28  RETICCTPCT 2.4   Sepsis Labs: Recent Labs  Lab 03/27/23 0835 03/27/23 1023 03/28/23 0015 03/28/23 0614 03/28/23 1632 03/28/23 1826  PROCALCITON 0.14  --   --   --   --   --   LATICACIDVEN 3.7*   < > 2.9* 3.7* 3.6* 2.2*   < > = values in this interval not displayed.   Recent Results (from the past 240 hour(s))  Resp panel by RT-PCR (RSV, Flu A&B, Covid) Anterior Nasal Swab     Status: None   Collection Time: 03/26/23  8:16 PM   Specimen: Anterior Nasal Swab  Result Value Ref Range Status   SARS Coronavirus 2 by RT PCR NEGATIVE NEGATIVE Final    Comment: (NOTE) SARS-CoV-2 target nucleic acids are NOT DETECTED.  The SARS-CoV-2 RNA is generally detectable in upper respiratory specimens during the acute phase of infection. The lowest concentration of SARS-CoV-2 viral copies this assay can detect is 138 copies/mL. A negative result does not preclude SARS-Cov-2 infection and should not be used as the sole basis for treatment or other patient management decisions. A negative result may occur with  improper specimen collection/handling, submission of specimen other than nasopharyngeal swab, presence of viral mutation(s) within the areas targeted by this assay, and inadequate number of viral copies(<138 copies/mL). A negative result must be combined with clinical observations, patient history, and epidemiological information. The expected result is Negative.  Fact Sheet for Patients:  BloggerCourse.com  Fact Sheet for Healthcare Providers:  SeriousBroker.it  This test is no t yet approved or cleared by the [place] FDA and  has been authorized for detection and/or diagnosis of SARS-CoV-2 by FDA under an Emergency Use Authorization (EUA). This EUA will remain  in effect (meaning this  test can be used) for the duration of the COVID-19 declaration under Section 564(b)(1) of the Act, 21 U.S.C.section 360bbb-3(b)(1), unless the authorization is terminated  or revoked sooner.       Influenza A by PCR NEGATIVE NEGATIVE Final   Influenza B by PCR NEGATIVE NEGATIVE Final    Comment: (NOTE) The Xpert Xpress SARS-CoV-2/FLU/RSV plus assay is intended as an aid in the diagnosis of influenza from Nasopharyngeal swab specimens and should not be used as a sole basis for treatment. Nasal washings and aspirates are unacceptable for Xpert Xpress SARS-CoV-2/FLU/RSV testing.  Fact Sheet for Patients: BloggerCourse.com  Fact Sheet for Healthcare Providers: SeriousBroker.it  This test is not yet approved or cleared by the [place] FDA and has been authorized for detection and/or diagnosis of SARS-CoV-2 by FDA under an Emergency Use Authorization (EUA). This EUA will remain in effect (meaning this test can be used) for the duration of the COVID-19 declaration under Section 564(b)(1) of the Act, 21 U.S.C. section 360bbb-3(b)(1), unless the authorization is terminated or revoked.     Resp Syncytial Virus by PCR NEGATIVE NEGATIVE Final    Comment: (NOTE) Fact Sheet for  Patients: BloggerCourse.com  Fact Sheet for Healthcare Providers: SeriousBroker.it  This test is not yet approved or cleared by the [place] FDA and has been authorized for detection and/or diagnosis of SARS-CoV-2 by FDA under an Emergency Use Authorization (EUA). This EUA will remain in effect (meaning this test can be used) for the duration of the COVID-19 declaration under Section 564(b)(1) of the Act, 21 U.S.C. section 360bbb-3(b)(1), unless the authorization is terminated or revoked.  Performed at [facility], [address] [place], [place] [address]   Blood culture  (routine x 2)     Status: None (Preliminary result)   Collection Time: 03/26/23  8:20 PM   Specimen: BLOOD  Result Value Ref Range Status   Specimen Description   Final    BLOOD LEFT ANTECUBITAL Performed at [facility], [address] [place], [place] [address]    Special Requests   Final    BOTTLES DRAWN AEROBIC AND ANAEROBIC Blood Culture results may not be optimal due to an excessive volume of blood received in culture bottles Performed at [facility], [address] [place], [place] [address]    Culture   Final    NO GROWTH 3 DAYS Performed at [facility], [address] [place], [place] [address]    Report Status PENDING  Incomplete  Blood culture (routine x 2)     Status: None (Preliminary result)   Collection Time: 03/26/23  8:30 PM   Specimen: BLOOD  Result Value Ref Range Status   Specimen Description   Final    BLOOD RIGHT ANTECUBITAL Performed at [facility], [address] [place], [place] [address]    Special Requests   Final    BOTTLES DRAWN AEROBIC AND ANAEROBIC Blood Culture adequate volume Performed at [facility], [address] [place], [place] [address]    Culture   Final    NO GROWTH 3 DAYS Performed at [facility], [address] [place], [place] [address]    Report Status PENDING  Incomplete  Respiratory (~20 pathogens) panel by PCR     Status: None   Collection Time: 03/27/23  1:02 PM   Specimen: Nasopharyngeal Swab; Respiratory  Result Value Ref Range Status   Adenovirus NOT DETECTED NOT DETECTED Final   Coronavirus 229E NOT DETECTED NOT DETECTED Final    Comment: (NOTE) The Coronavirus on the Respiratory Panel, DOES NOT test for the novel  Coronavirus (2019 nCoV)    Coronavirus HKU1 NOT DETECTED NOT DETECTED Final   Coronavirus NL63 NOT DETECTED NOT DETECTED Final   Coronavirus OC43 NOT DETECTED NOT DETECTED Final   Metapneumovirus NOT  DETECTED NOT DETECTED Final   Rhinovirus / Enterovirus NOT DETECTED NOT DETECTED Final   Influenza A NOT DETECTED NOT DETECTED Final   Influenza B NOT DETECTED NOT DETECTED Final   Parainfluenza Virus 1 NOT DETECTED NOT DETECTED Final   Parainfluenza Virus 2 NOT DETECTED NOT DETECTED Final   Parainfluenza Virus 3 NOT DETECTED NOT DETECTED Final   Parainfluenza Virus 4 NOT DETECTED NOT DETECTED Final   Respiratory Syncytial Virus NOT DETECTED NOT DETECTED Final   Bordetella pertussis NOT DETECTED NOT DETECTED Final   Bordetella Parapertussis NOT DETECTED NOT DETECTED Final   Chlamydophila pneumoniae NOT DETECTED NOT DETECTED Final   Mycoplasma pneumoniae NOT DETECTED NOT DETECTED Final    Comment: Performed at [facility], [address] [place], [place] [address]    Radiology Studies: CT CHEST  WO CONTRAST  Result Date: 03/30/2023 CLINICAL DATA:  Chronic cough EXAM: CT CHEST WITHOUT CONTRAST TECHNIQUE: Multidetector CT imaging of the chest was performed following the standard protocol without IV contrast. RADIATION DOSE REDUCTION: This exam was performed according to the departmental dose-optimization program which includes automated exposure control, adjustment of the mA and/or kV according to patient size and/or use of iterative reconstruction technique. COMPARISON:  Chest CT dated March 18, 2022 FINDINGS: Cardiovascular: Cardiomegaly. Normal caliber thoracic aorta with moderate calcified plaque. Severe coronary artery calcifications. Dilated main pulmonary artery, measuring up to 3.6 cm. CardioMEMS device in the right lower lobe pulmonary artery. Mediastinum/Nodes: Esophagus and thyroid are unremarkable. No enlarged lymph nodes seen in the chest. Lungs/Pleura: Central airways are patent. Severe centrilobular emphysema. Mild lower lung predominant linear opacities which are likely due to scarring or atelectasis. Stable subpleural solid nodule of the right lower lobe measuring 5 mm on  series 6, image 104, no specific follow-up imaging is necessary given greater than 1 year stability. Upper Abdomen: Prior cholecystectomy. Musculoskeletal: No chest wall mass or suspicious bone lesions identified. IMPRESSION: 1. No acute airspace opacity. 2. Dilated main pulmonary artery, which can be seen in the setting of pulmonary hypertension. 3. Coronary artery calcifications, aortic Atherosclerosis (ICD10-I70.0) and Emphysema (ICD10-J43.9). 4. Cardiomegaly. Electronically Signed   By: Allegra Lai M.D.   On: 03/30/2023 11:27   DG CHEST PORT 1 VIEW  Result Date: 03/30/2023 CLINICAL DATA:  Shortness of breath. EXAM: PORTABLE CHEST 1 VIEW COMPARISON:  March 29, 2023. FINDINGS: Stable cardiomediastinal silhouette. Left-sided pacemaker is unchanged. Mild central pulmonary vascular congestion is noted. Mildly increased interstitial densities are noted throughout both lungs which may represent edema. Small left pleural effusion is noted. Bibasilar atelectasis is again noted. Bony thorax is unremarkable. IMPRESSION: Stable cardiomediastinal silhouette with mild central pulmonary vascular congestion and possible bilateral pulmonary edema. Small left pleural effusion. Bibasilar subsegmental atelectasis. Electronically Signed   By: Lupita Raider M.D.   On: 03/30/2023 10:30   DG CHEST PORT 1 VIEW  Result Date: 03/29/2023 CLINICAL DATA:  Shortness of breath. EXAM: PORTABLE CHEST 1 VIEW COMPARISON:  03/28/2023 FINDINGS: The cardio pericardial silhouette is enlarged. Bibasilar atelectasis with possible tiny bilateral pleural effusions. No pulmonary edema. Left-sided permanent pacemaker again noted. Degenerative changes noted right shoulder. Telemetry leads overlie the chest. IMPRESSION: Bibasilar atelectasis with possible tiny bilateral pleural effusions. Electronically Signed   By: Kennith Center M.D.   On: 03/29/2023 19:10    Scheduled Meds:  dextromethorphan-guaiFENesin  1 tablet Oral BID   dofetilide   250 mcg Oral BID   doxycycline  100 mg Oral Q12H   fluticasone furoate-vilanterol  1 puff Inhalation Daily   gabapentin  200 mg Oral QHS   insulin aspart  0-15 Units Subcutaneous TID WC   insulin aspart  0-5 Units Subcutaneous QHS   levalbuterol  0.63 mg Nebulization TID   losartan  12.5 mg Oral Daily   methylPREDNISolone (SOLU-MEDROL) injection  40 mg Intravenous Q12H   potassium chloride SA  40 mEq Oral Daily   revefenacin  175 mcg Nebulization Daily   rivaroxaban  20 mg Oral Q supper   Continuous Infusions:  cefTRIAXone (ROCEPHIN)  IV Stopped (03/30/23 1037)   metronidazole 500 mg (03/30/23 1038)    LOS: 4 days   Marguerita Merles, DO [facility] Available via Epic secure chat 7am-7pm After these hours, please refer to coverage provider listed on [email or web address] 03/30/2023, 4:53 PM

## 2023-03-30 NOTE — Progress Notes (Signed)
   03/30/23 2221  BiPAP/CPAP/SIPAP  BiPAP/CPAP/SIPAP Pt Type Adult  BiPAP/CPAP/SIPAP DREAMSTATIOND  Mask Type Full face mask  Mask Size Medium  Respiratory Rate 20 breaths/min  IPAP 12 cmH20 (home settings per chart notes, pt comfortable on these settings)  EPAP 8 cmH2O  Flow Rate 5 lpm  Patient Home Equipment No  Auto Titrate No  CPAP/SIPAP surface wiped down Yes  BiPAP/CPAP /SiPAP Vitals  Pulse Rate 72  Resp 20  SpO2 98 %

## 2023-03-30 NOTE — Plan of Care (Signed)

## 2023-03-31 ENCOUNTER — Ambulatory Visit: Payer: Medicare Other

## 2023-03-31 ENCOUNTER — Inpatient Hospital Stay (HOSPITAL_COMMUNITY): Payer: Medicare Other

## 2023-03-31 DIAGNOSIS — N824 Other female intestinal-genital tract fistulae: Secondary | ICD-10-CM | POA: Diagnosis not present

## 2023-03-31 DIAGNOSIS — J441 Chronic obstructive pulmonary disease with (acute) exacerbation: Secondary | ICD-10-CM | POA: Diagnosis not present

## 2023-03-31 DIAGNOSIS — A419 Sepsis, unspecified organism: Secondary | ICD-10-CM | POA: Diagnosis not present

## 2023-03-31 LAB — GLUCOSE, CAPILLARY
Glucose-Capillary: 164 mg/dL — ABNORMAL HIGH (ref 70–99)
Glucose-Capillary: 130 mg/dL — ABNORMAL HIGH (ref 70–99)
Glucose-Capillary: 188 mg/dL — ABNORMAL HIGH (ref 70–99)
Glucose-Capillary: 120 mg/dL — ABNORMAL HIGH (ref 70–99)

## 2023-03-31 LAB — CBC WITH DIFFERENTIAL/PLATELET
Abs Immature Granulocytes: 0.72 10*3/uL — ABNORMAL HIGH (ref 0.00–0.07)
Basophils Absolute: 0.1 10*3/uL (ref 0.0–0.1)
Basophils Relative: 0 %
Eosinophils Absolute: 0 10*3/uL (ref 0.0–0.5)
Eosinophils Relative: 0 %
HCT: 33.6 % — ABNORMAL LOW (ref 36.0–46.0)
Hemoglobin: 10.2 g/dL — ABNORMAL LOW (ref 12.0–15.0)
Immature Granulocytes: 4 %
Lymphocytes Relative: 5 %
Lymphs Abs: 0.8 10*3/uL (ref 0.7–4.0)
MCH: 26.8 pg (ref 26.0–34.0)
MCHC: 30.4 g/dL (ref 30.0–36.0)
MCV: 88.4 fL (ref 80.0–100.0)
Monocytes Absolute: 0.5 10*3/uL (ref 0.1–1.0)
Monocytes Relative: 3 %
Neutro Abs: 14.3 10*3/uL — ABNORMAL HIGH (ref 1.7–7.7)
Neutrophils Relative %: 88 %
Platelets: 242 10*3/uL (ref 150–400)
RBC: 3.8 MIL/uL — ABNORMAL LOW (ref 3.87–5.11)
RDW: 14 % (ref 11.5–15.5)
WBC: 16.4 10*3/uL — ABNORMAL HIGH (ref 4.0–10.5)
nRBC: 0 % (ref 0.0–0.2)

## 2023-03-31 LAB — COMPREHENSIVE METABOLIC PANEL
ALT: 13 U/L (ref 0–44)
AST: 14 U/L — ABNORMAL LOW (ref 15–41)
Albumin: 2.9 g/dL — ABNORMAL LOW (ref 3.5–5.0)
Alkaline Phosphatase: 36 U/L — ABNORMAL LOW (ref 38–126)
Anion gap: 7 (ref 5–15)
BUN: 23 mg/dL (ref 8–23)
CO2: 25 mmol/L (ref 22–32)
Calcium: 9.7 mg/dL (ref 8.9–10.3)
Chloride: 105 mmol/L (ref 98–111)
Creatinine, Ser: 0.79 mg/dL (ref 0.44–1.00)
GFR, Estimated: >60 mL/min (ref >60)
Glucose, Bld: 190 mg/dL — ABNORMAL HIGH (ref 70–99)
Potassium: 4.6 mmol/L (ref 3.5–5.1)
Sodium: 137 mmol/L (ref 135–145)
Total Bilirubin: 0.6 mg/dL (ref 0.3–1.2)
Total Protein: 5.5 g/dL — ABNORMAL LOW (ref 6.5–8.1)

## 2023-03-31 LAB — MAGNESIUM: Magnesium: 2 mg/dL (ref 1.7–2.4)

## 2023-03-31 LAB — PHOSPHORUS: Phosphorus: 4.6 mg/dL (ref 2.5–4.6)

## 2023-03-31 MED ORDER — PANTOPRAZOLE SODIUM 40 MG PO TBEC
40.0000 mg | DELAYED_RELEASE_TABLET | Freq: Every day | ORAL | Status: DC
  Administered 2023-03-31 – 2023-04-04 (×5): 40 mg via ORAL
  Filled 2023-03-31 (×5): qty 1

## 2023-03-31 MED ORDER — SODIUM CHLORIDE 3 % IN NEBU
4.0000 mL | INHALATION_SOLUTION | Freq: Every day | RESPIRATORY_TRACT | Status: AC
  Administered 2023-03-31 – 2023-04-02 (×3): 4 mL via RESPIRATORY_TRACT
  Filled 2023-03-31 (×3): qty 4

## 2023-03-31 NOTE — Progress Notes (Signed)
Progress Note   Patient: Kathleen [name] YSA:[id number] DOB: Mar 30, 1946 DOA: 03/26/2023     5 DOS: the patient was seen and examined on 03/31/2023   Brief hospital course: HPI per Dr. Gery Pray on 03/27/23  This is a 77 year old female with past medical history of OSA (on BiPAP), BOOP (noncandidate for stenting), COPD, chronic respiratory failure 5L, HFrEF, A. fib (s/p AVN ablation, BiV, on anticoagulation), HTN, gout, and colovesical fistula.  On Tuesday and Wednesday patient developed a dry, rhonchorous, racking nonproductive cough.  She developed wheezing, on and off fevers, Tmax 100 degrees at home.  She is a night she was diaphoretic, soaking nurse clothing and sheets.  Per patient on Wednesday she was called by cardiologist and told to take her Metalazone dose, 30 min later take her torsemide.  She was to have repeated this 2 days.  This was called based on I thought she was fluid overloaded.  The patient is followed by the advanced heart clinic and per patient they are able to remotely evaluate her fluid status. (Telephone conversation cardiologist NP noted on the 11th, without directions for additional diuretic usage).  Her son took her to [facility] ER.   In the ER patient Tmax 102.5, vitals otherwise stable.  Patient wheezing on presentation.  Potassium 2.7, magnesium normal.  BNP 48.5, lactic acid 2.7 ==> 6.1.  CXR cardiomegaly with hazy bibasilar atelectasis or infiltrates. Blood cultures x 2 collected, Solu-Medrol, Rocephin, IV doxycycline, Tylenol, 2 L NS, 40 meq K given in the ER.  She is accepted in transfer.  **Interim History  She developed abdominal pain on left side but she does have acute diverticulitis noted on her most recent scan so we will added Flagyl.  She continues to have a significant cough and have added doxycycline.  We are ordering pulmonary toileting and lactic acid level was elevated but is trending down now.  Will continue antibiotic coverage currently and she is  improving.  GI and pulm have consulted and is planning on repeating a CT scan of the abdomen pelvis on Thursday and pulmonary repeated a CT scan of the chest on 10/22  Assessment and Plan: # Severe sepsis in the setting of Acute Diverticulitis with associated Colo vaginal fistula # Lactic Acidosis  Overall, improving but with slight increase in WBC. She has remained afebrile overnight. Blood cultures have been negative for 4 days. She remains on broad spectrum abx. GI following with plan for repeat CT A/P on 10/24. Last lactic acid 2.2 three days ago.  -GI following, appreciate recs -Continue Rocephin, doxycycline and flagyl -Repeat lactic acid tomorrow -Trend fever curve, CBC -Pending repeat CT A/P tomorrow  # Acute exacerbation of COPD wih Pneumonia  # Acute on chronic chronic respiratory failure with hypoxia (HCC) Repeat CT chest yesterday did not show any airspace disease. Remains on baseline 5 L N/C. Continues to have congested cough with inability to produce sputum.  -Pulm following, appreciate recs -Scheduled and as needed nebulizers ordered -Continue IV Solu-Medrol through today -Mucinex DM scheduled, Tussionex as needed.  -Incentive spirometer ordered along with Flutter Valve  # Chronic systolic heart failure (HCC) EF was 35 to 40%. BNP 48 on admission. Holding Demadex and metolazone until potassium normalizes. No significant change in respiratory status. No crackles on lung auscultation. Mild edema up her ankles.   -Ted hose for BLE edema -Resume torsemide if increase in O2 requirement -Daily potassium continued -Strict I's and O's and daily weights   # Prolonged QT -Initially 539.  K+ 4.6, now in the normal range. Mag 2.0, phos 4.6. Repeat EKG on 10/19 with improved Qtc to 480 -Telemetry -Trend and replete electrolytes   # Hypercoagulable state due to persistent atrial fibrillation (HCC) -On Tikosyn and Xarelto, continued -Trend and replete electrolytes   # OSA #  BOOP -On BiPaP reordered -See above for chest imaging   # CAD  # HTN (hypertension) -Continue Losartan; currently not complaining of any chest pain and blood pressures currently reading 157/79 on last check   # Lung Nodule -Stable at 5 mm and Needs to be followed in the outpatient setting  # Normocytic Anemia # Iron deficiency anemia Iron level of 28, UIBC 279, TIBC 307, saturation ratio 9%, ferritin of 79, folate level 6.7 and vitamin B12 535. Hgb stable at 10.2 today. Will hold off on repleting iron in the setting of an active infection.  -Repeat CBC in the AM  # Hypoalbuminemia -Patient's Albumin is down to 2.9 -Continue to Monitor and Trend and repeat CMP in the AM  # Obesity -Complicates overall prognosis and care -Estimated body mass index is 39.14 kg/m as calculated from the following:   Height as of this encounter: 5' (1.524 m).   Weight as of this encounter: 90.9 kg.  -Weight Loss and Dietary Counseling given      Subjective: Patient laying upright in bed occasionally with forceful coughing. States her breathing has not improve much. She is having difficulty clearing her secretions. Removes some increased edema to her lower legs  Physical Exam: Vitals:   03/31/23 0500 03/31/23 0707 03/31/23 0748 03/31/23 1323  BP:  (!) 145/59  (!) 145/73  Pulse:  71  70  Resp:  18  18  Temp:  97.8 F (36.6 C)  98.1 F (36.7 C)  TempSrc:  Oral  Oral  SpO2:  99% 99% 100%  Weight: 90 kg     Height:      General: Pleasant, well-appearing obese woman laying in bed. No acute distress. CV: RRR. No murmurs, rubs, or gallops. 1+ BLE edema to the ankles Pulmonary: On 5LNC. Significant rhonchi throughout w/ some wheezing.  Abdominal: Soft, Mild discomfort in the abdomen with deep palpable. Normal bowel sounds. Extremities: Palpable radial and DP pulses. Normal ROM. Skin: Warm and dry. Healed bruises in the upper extremities Neuro: A&Ox3. Moves all extremities. Normal sensation. No  focal deficit. Psych: Normal mood and affect  Data Reviewed:  WBC 16.4, albumin 2.9, K+ 4.6, hgb 10.2  Family Communication: No family at Bedside  Disposition: Status is: Inpatient Remains inpatient appropriate because: Ongoing medical management  Planned Discharge Destination: Pending    Author: Steffanie Rainwater, MD 03/31/2023 1:31 PM  For on call review [email or web address].

## 2023-03-31 NOTE — Progress Notes (Signed)
Small amount of dark blood noted on clean up after using bathroom. On-call notified. Will continue to monitor

## 2023-03-31 NOTE — Progress Notes (Signed)
   03/31/23 2227  BiPAP/CPAP/SIPAP  BiPAP/CPAP/SIPAP Pt Type Adult  BiPAP/CPAP/SIPAP DREAMSTATIOND  Mask Type Full face mask  Mask Size Medium  Respiratory Rate 16 breaths/min  IPAP 12 cmH20 (12/8 per home settings)  EPAP 8 cmH2O  Flow Rate 5 lpm  Patient Home Equipment No (only her mask from home)  Auto Titrate No  CPAP/SIPAP surface wiped down Yes  BiPAP/CPAP /SiPAP Vitals  Pulse Rate 71  SpO2 98 %

## 2023-03-31 NOTE — Progress Notes (Signed)
NAME:  Kathleen [name], MRN:  [id number], DOB:  Aug 22, 1945, LOS: 5 ADMISSION DATE:  03/26/2023, CONSULTATION DATE:  03/29/2023 REFERRING MD:  Dr. Marland Mcalpine - [facility], CHIEF COMPLAINT:  Acute respiratory distress    History of Present Illness:  Kathleen [name] is a 77 y.o. with a past medical history significant for Boop, OSA on BIPAP, COPD, chronic hypoxic respiratory failure on 5 L nasal cannula at baseline, spontaneous pneumothorax, asthma CHF, complete heart block now s/pacemaker, HFrEF, HTN, atrial fibrillation, and colovesicular fistula who presented to the ED 10/18 with complaint of nonproductive cough with wheezing and low-grade fever.  Pertinent  Medical History  Boop, OSA on BIPAP, COPD, chronic hypoxic respiratory failure on 5 L nasal cannula at baseline, spontaneous pneumothorax, asthma CHF, complete heart block now s/pacemaker, HFrEF, HTN, atrial fibrillation, and colovesicular fistula   Significant Hospital Events: Including procedures, antibiotic start and stop dates in addition to other pertinent events   10/18 presented with both GI and respiratory complaints with extensive pulmonary history 10/21 PCCM consulted   Interim History / Subjective:  Feels about the same.  Cough remains biggest complaint.   Objective   Blood pressure (!) 145/73, pulse 70, temperature 98.1 F (36.7 C), temperature source Oral, resp. rate 18, height 5' (1.524 m), weight 90 kg, SpO2 98%.       No intake or output data in the 24 hours ending 03/31/23 1434  Filed Weights   03/29/23 0500 03/30/23 0500 03/31/23 0500  Weight: 89.8 kg 90.9 kg 90 kg    Examination: General: Elderly female in NAD HEENT: [place]/AT, PERRL, no JVD Neuro: Alert, oriented, non-focal CV: RRR< no MRG PULM: Rhonchi in bilateral bases. Frequent cough. No production noted.  GI: Soft, NT. ND Extremities: No acute deformity or edema.    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 I asked her about the allergies and joint pain was so severe she does not want to escalate therapy. Currently on Breo and yupelri - Solumedrol continue 40mg  BID day 4 >will target a short course considering diverticulitis.  - Tussionex for course - Incentive spirometry and flutter valve - Will try hypertonic nebs to help with mucous clearance.  - Doxy and Flagyl  > GI suggested possibly transitioning to Zosyn, per primary.  - Weight is up, BNP low. Defer diuretics for now.  - outpatient follow up for nodule.   Other problems per primary - Acute diverticulitis - for repeat CT 10/24 - Colovaginal fistula  Best Practice (right click and "Reselect all SmartList Selections" daily)  Per primary   Critical care time: NA    Joneen Roach, AGACNP-BC [facility]  See Amion for personal pager PCCM on call pager [phone] until 7pm. Please call Elink 7p-7a. [phone]  03/31/2023 2:34 PM

## 2023-03-31 NOTE — Progress Notes (Addendum)
Progress Note  Primary GI: Dr. Meridee Score DOA: 03/26/2023         Hospital Day: 6   Subjective  Chief Complaint: Persistent sigmoid diverticulitis with colovaginal fistula  No family was present at the time of my evaluation. Patient coughing significantly during interview states coughing has been worse causing some chest pain.  She does complain of chest discomfort yesterday as a heaviness in her chest lasted about 20 minutes was worse with the BiPAP was not happen again no other associated symptoms.  She has been having bowel movements vaginally due to fistula denies any further episodes of blood in the stool.  Continues to have lower abdominal discomfort and has had some dysuria yesterday. She also states she has had some new onset dysphagia and some discomfort with eating and drinking with dry mouth.    Objective   Vital signs in last 24 hours: Temp:  [97.8 F (36.6 C)-98.2 F (36.8 C)] 97.8 F (36.6 C) (10/23 0707) Pulse Rate:  [70-72] 71 (10/23 0707) Resp:  [16-20] 18 (10/23 0707) BP: (139-157)/(54-79) 145/59 (10/23 0707) SpO2:  [97 %-100 %] 99 % (10/23 0748) Weight:  [90 kg] 90 kg (10/23 0500) Last BM Date : 03/30/23 Last BM recorded by nurses in past 5 days No data recorded  General: Chronically ill-appearing female in no acute distress  Heart:  Regular rate and rhythm; no murmurs Pulm: Anteriorly with rhonchi/coarse breath sounds, decreased breath sounds bilaterally, harsh cough, 5 L cannula Abdomen:  Soft, Obese AB, active bowel sounds.  Mild to moderate tenderness lower abdomen. Without guarding and Without rebound, No organomegaly appreciated. Extremities:  with bilateral edema. Neurologic:  Alert and  oriented x4;  No focal deficits.  Psych:  Cooperative. Normal mood and affect.  Intake/Output from previous day: 10/22 0701 - 10/23 0700 In: 120 [P.O.:120] Out: -  Intake/Output this shift: No intake/output data recorded.  Studies/Results: CT CHEST WO  CONTRAST  Result Date: 03/30/2023 CLINICAL DATA:  Chronic cough EXAM: CT CHEST WITHOUT CONTRAST TECHNIQUE: Multidetector CT imaging of the chest was performed following the standard protocol without IV contrast. RADIATION DOSE REDUCTION: This exam was performed according to the departmental dose-optimization program which includes automated exposure control, adjustment of the mA and/or kV according to patient size and/or use of iterative reconstruction technique. COMPARISON:  Chest CT dated March 18, 2022 FINDINGS: Cardiovascular: Cardiomegaly. Normal caliber thoracic aorta with moderate calcified plaque. Severe coronary artery calcifications. Dilated main pulmonary artery, measuring up to 3.6 cm. CardioMEMS device in the right lower lobe pulmonary artery. Mediastinum/Nodes: Esophagus and thyroid are unremarkable. No enlarged lymph nodes seen in the chest. Lungs/Pleura: Central airways are patent. Severe centrilobular emphysema. Mild lower lung predominant linear opacities which are likely due to scarring or atelectasis. Stable subpleural solid nodule of the right lower lobe measuring 5 mm on series 6, image 104, no specific follow-up imaging is necessary given greater than 1 year stability. Upper Abdomen: Prior cholecystectomy. Musculoskeletal: No chest wall mass or suspicious bone lesions identified. IMPRESSION: 1. No acute airspace opacity. 2. Dilated main pulmonary artery, which can be seen in the setting of pulmonary hypertension. 3. Coronary artery calcifications, aortic Atherosclerosis (ICD10-I70.0) and Emphysema (ICD10-J43.9). 4. Cardiomegaly. Electronically Signed   By: Allegra Lai M.D.   On: 03/30/2023 11:27   DG CHEST PORT 1 VIEW  Result Date: 03/30/2023 CLINICAL DATA:  Shortness of breath. EXAM: PORTABLE CHEST 1 VIEW COMPARISON:  March 29, 2023. FINDINGS: Stable cardiomediastinal silhouette. Left-sided pacemaker is unchanged. Mild central  pulmonary vascular congestion is noted. Mildly  increased interstitial densities are noted throughout both lungs which may represent edema. Small left pleural effusion is noted. Bibasilar atelectasis is again noted. Bony thorax is unremarkable. IMPRESSION: Stable cardiomediastinal silhouette with mild central pulmonary vascular congestion and possible bilateral pulmonary edema. Small left pleural effusion. Bibasilar subsegmental atelectasis. Electronically Signed   By: Lupita Raider M.D.   On: 03/30/2023 10:30   DG CHEST PORT 1 VIEW  Result Date: 03/29/2023 CLINICAL DATA:  Shortness of breath. EXAM: PORTABLE CHEST 1 VIEW COMPARISON:  03/28/2023 FINDINGS: The cardio pericardial silhouette is enlarged. Bibasilar atelectasis with possible tiny bilateral pleural effusions. No pulmonary edema. Left-sided permanent pacemaker again noted. Degenerative changes noted right shoulder. Telemetry leads overlie the chest. IMPRESSION: Bibasilar atelectasis with possible tiny bilateral pleural effusions. Electronically Signed   By: Kennith Center M.D.   On: 03/29/2023 19:10    Lab Results: Recent Labs    03/29/23 1013 03/29/23 1459 03/29/23 2131 03/30/23 0708 03/31/23 0603  WBC 14.6*  --   --  12.3* 16.4*  HGB 11.0*   < > 11.1* 10.3* 10.2*  HCT 36.1   < > 35.3* 33.6* 33.6*  PLT 284  --   --  240 242   < > = values in this interval not displayed.   BMET Recent Labs    03/29/23 1013 03/30/23 0708 03/31/23 0603  NA 139 139 137  K 4.1 4.6 4.6  CL 102 105 105  CO2 22 24 25   GLUCOSE 163* 173* 190*  BUN 21 22 23   CREATININE 0.70 0.62 0.79  CALCIUM 10.3 9.8 9.7   LFT Recent Labs    03/31/23 0603  PROT 5.5*  ALBUMIN 2.9*  AST 14*  ALT 13  ALKPHOS 36*  BILITOT 0.6   PT/INR No results for input(s): "LABPROT", "INR" in the last 72 hours.   Scheduled Meds:  dextromethorphan-guaiFENesin  1 tablet Oral BID   dofetilide  250 mcg Oral BID   doxycycline  100 mg Oral Q12H   fluticasone furoate-vilanterol  1 puff Inhalation Daily   gabapentin   200 mg Oral QHS   insulin aspart  0-15 Units Subcutaneous TID WC   insulin aspart  0-5 Units Subcutaneous QHS   levalbuterol  0.63 mg Nebulization TID   losartan  12.5 mg Oral Daily   methylPREDNISolone (SOLU-MEDROL) injection  40 mg Intravenous Q12H   potassium chloride SA  40 mEq Oral Daily   revefenacin  175 mcg Nebulization Daily   rivaroxaban  20 mg Oral Q supper   Continuous Infusions:  cefTRIAXone (ROCEPHIN)  IV Stopped (03/30/23 1037)   metronidazole 500 mg (03/30/23 2149)     Impression/Plan:   Acute sigmoid diverticulitis complicated by colovaginal fistula IV Rocephin, metronidazole and doxycycline day 5 Plan on reimaging on Thursday week after initiation of IV antibiotics Patient remains high risk for endoscopic or surgical intervention, scheduled for colonoscopy 11/25 with Dr. Meridee Score, has outpatient appointment scheduled Dr. Michaell Cowing for considering a potential diverting colostomy  Hematochezia on Xarelto Likely secondary to diverticular disease versus friability/ulceration along fistula Hemoglobin 10.2 previously 11.3 Hemoglobin slightly decreased no further episodes of GI bleeding, possibly dilutional continue to monitor CBC transfuse to keep greater than 7, need to monitor fluid overload  Dysphagia New onset in the hospital with some odynophagia Possibly GERD related versus candidal esophagitis with patient's steroids and antibiotics Patient is also getting doxycycline p.o. since 10/20 can also cause esophagitis Will add on Protonix 40 mg oral  once daily Consider empirically treating for yeast esophagitis  Chest discomfort 1 episode of chest heaviness yesterday while on BiPAP lasting 20 minutes No further episodes Patient on telemetry Potentially from esophagitis  Acute on chronic respiratory failure-5 L at baseline Currently being treated for pneumonia, COPD exacerbation with above antibiotics and Solu-Medrol CT chest without contrast 10/21 no acute  airspace opacity, dilated main pulmonary artery setting pulm hypertension, coronary artery calcifications, emphysema and cardiomegaly Continues to have coughing, shortness of breath per patient getting repeat chest x-ray  Sleep apnea BiPAP  Prolonged QT  History of A-fib with SSS status post pacemaker On Xarelto  HFr EF 35 to 40% echo 09/2022   Active Problems:   Chronic respiratory failure with hypoxia (HCC)   Chronic systolic heart failure (HCC)   HTN (hypertension)   Atrial fibrillation (HCC)   Chronic anticoagulation   Hyperlipidemia with target LDL less than 100   Chronic left-sided low back pain   Acquired hypothyroidism   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   Hypercoagulable state due to persistent atrial fibrillation (HCC)   Acute exacerbation of chronic obstructive pulmonary disease (COPD) (HCC)   Colovaginal fistula    LOS: 5 days   Kathleen [name]  03/31/2023, 8:52 AM   Attending physician's note   I have taken history, reviewed the chart and examined the patient. I performed a substantive portion of this encounter, including complete performance of at least one of the key components, in conjunction with the APP. I agree with the Advanced Practitioner's note, impression and recommendations.   For CT AP with contrast tomorrow Protonix added. Will follow along.   Edman Circle, MD [facility] [phone]

## 2023-03-31 NOTE — Progress Notes (Signed)
Physical Therapy Treatment Patient Details Name: Kathleen [name] MRN: [id number] DOB: 12/18/1945 Today's Date: 03/31/2023   History of Present Illness The pt is a 77 yo female presenting 10/18 with Acute exacerbation COPD, Pneumonia, lactic acidosis, severe sepsis   Chronic respiratory failure with hypoxia. Pt with admission last month to [facility] with brown stool/discharge from vagina x1 week. 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  August previous admission to [facility] for COPD exacerbation.  PMH includes: COPD on 4-5L O2, asthma, OSA on bipap, afib, CHF.    PT Comments   Pt admitted with above diagnosis. pt Pt currently with functional limitations due to the deficits listed below (see PT Problem List).  Pt seated in recliner when PT arrived. Pt agreeable to therapy intervention. Pt indicated feeling more fatigued today and B LE edema R > L with residual chest/intercostal pain related to frequent coughing. Pt stated SOB at baseline on 5 L/min and O2 saturation 97%. S for sit to stand  from recliner, S for gait tasks performed with RW and on 6 L/min with pt maintaining >/=91%, 60 feet x 2 with one standing therapeutic rest break. Pt left seated in recliner, all needs in place and encouraged to elevated B Les following lunch and perform ankle pumps. Pt expressed concern per hygiene in ILF setting due to required A currently PT provided ed on AE recommendations including bidet and toileting wand. Pt expressed interest and PT to communicate with OT staff.  Pt will benefit from acute skilled PT to increase their independence and safety with mobility to allow discharge.     If plan is discharge home, recommend the following: A little help with walking and/or transfers;Help with stairs or ramp for entrance;A little help with bathing/dressing/bathroom   Can travel by private vehicle        Equipment Recommendations  None recommended by PT    Recommendations for Other Services       Precautions /  Restrictions Precautions Precautions: Fall Precaution Comments: Keep SpO2 above 88% Restrictions Weight Bearing Restrictions: No Other Position/Activity Restrictions: Fragile skin     Mobility  Bed Mobility Overal bed mobility: Modified Independent             General bed mobility comments: pt seated in recliner when PT arrived    Transfers Overall transfer level: Needs assistance Equipment used: Rolling walker (2 wheels) Transfers: Sit to/from Stand Sit to Stand: Supervision           General transfer comment: from recliner with min cues    Ambulation/Gait Ambulation/Gait assistance: Supervision Gait Distance (Feet): 60 Feet Assistive device: Rolling walker (2 wheels) Gait Pattern/deviations: Step-to pattern, Trunk flexed Gait velocity: decreased     General Gait Details: 1 standing rest break with 60' x 2 RW and O2 saturation first amb bout 91% on 6 L/min second amb bout following standing theraputic rest break 96% on 6 L/min once seated and pt transitioned to 5 L/min and 97% pt indicated fatigue limiting gait distance today, SOB and discomfort B feet due to edema   Stairs             Wheelchair Mobility     Tilt Bed    Modified Rankin (Stroke Patients Only)       Balance Overall balance assessment: Mild deficits observed, not formally tested, History of Falls  Cognition Arousal: Alert Behavior During Therapy: WFL for tasks assessed/performed Overall Cognitive Status: Within Functional Limits for tasks assessed                                          Exercises      General Comments General comments (skin integrity, edema, etc.): noted B LE distal edema, dorsal surface of B feet L> R pt encouraged to elevate B LE in recliner or bed  as well as perform ankle pumps following lunch      Pertinent Vitals/Pain Pain Assessment Pain Assessment: Faces Faces Pain  Scale: Hurts little more Pain Location: throat and chest from coughing Pain Descriptors / Indicators: Sore Pain Intervention(s): Monitored during session, Premedicated before session    Home Living                          Prior Function            PT Goals (current goals can now be found in the care plan section) Acute Rehab PT Goals Patient Stated Goal: Get rid of cough and improve activity tolerance PT Goal Formulation: With patient Time For Goal Achievement: 04/03/23 Potential to Achieve Goals: Good Progress towards PT goals: Progressing toward goals    Frequency    Min 1X/week      PT Plan      Co-evaluation              AM-PAC PT "6 Clicks" Mobility   Outcome Measure  Help needed turning from your back to your side while in a flat bed without using bedrails?: A Little Help needed moving from lying on your back to sitting on the side of a flat bed without using bedrails?: A Little Help needed moving to and from a bed to a chair (including a wheelchair)?: A Little Help needed standing up from a chair using your arms (e.g., wheelchair or bedside chair)?: A Little Help needed to walk in hospital room?: A Little Help needed climbing 3-5 steps with a railing? : A Lot 6 Click Score: 17    End of Session Equipment Utilized During Treatment: Gait belt Activity Tolerance: Patient tolerated treatment well Patient left: in bed;with call bell/phone within reach Nurse Communication: Mobility status PT Visit Diagnosis: Other abnormalities of gait and mobility (R26.89);Difficulty in walking, not elsewhere classified (R26.2)     Time: 1517-6160 PT Time Calculation (min) (ACUTE ONLY): 32 min  Charges:    $Gait Training: 8-22 mins $Therapeutic Activity: 8-22 mins PT General Charges $$ ACUTE PT VISIT: 1 Visit                     Johnny Bridge, PT Acute Rehab    Jacqualyn Posey 03/31/2023, 2:11 PM

## 2023-03-31 NOTE — Plan of Care (Signed)
  Problem: Education: Goal: Knowledge of General Education information will improve Description: Including pain rating scale, medication(s)/side effects and non-pharmacologic comfort measures Outcome: Progressing   Problem: Activity: Goal: Risk for activity intolerance will decrease Outcome: Progressing   Problem: Nutrition: Goal: Adequate nutrition will be maintained Outcome: Progressing   Problem: Safety: Goal: Ability to remain free from injury will improve Outcome: Progressing   Problem: Skin Integrity: Goal: Risk for impaired skin integrity will decrease Outcome: Progressing   Problem: Education: Goal: Ability to describe self-care measures that may prevent or decrease complications (Diabetes Survival Skills Education) will improve Outcome: Progressing   Problem: Fluid Volume: Goal: Ability to maintain a balanced intake and output will improve Outcome: Progressing   Problem: Metabolic: Goal: Ability to maintain appropriate glucose levels will improve Outcome: Progressing   Problem: Nutritional: Goal: Maintenance of adequate nutrition will improve Outcome: Progressing   Problem: Skin Integrity: Goal: Risk for impaired skin integrity will decrease Outcome: Progressing

## 2023-03-31 NOTE — TOC Initial Note (Signed)
Transition of Care [facility]) - Initial/Assessment Note    Patient Details  Name: Kathleen [name] MRN: [id number] Date of Birth: May 21, 1946  Transition of Care [facility]) CM/SW Contact:    Harriett Sine, RN Phone Number: 03/31/2023, 12:46 PM  Clinical Narrative:                 Pt from [place] at [facility]. Pt has pcp. Spoke with pt at bedside about [facility] and d/c plans. Pt states she will be returning to [facility] with d/c.  Called [facility] [place] and confirmed pt return. No SDOH at this time, TOC following  Expected Discharge Plan:  ([facility]) Barriers to Discharge: No Barriers Identified   Patient Goals and CMS Choice Patient states their goals for this hospitalization and ongoing recovery are:: none stated CMS Medicare.gov Compare Post Acute Care list provided to:: Patient Choice offered to / list presented to : Patient [place] ownership interest in [facility].provided to::  (NA)    Expected Discharge Plan and Services     Post Acute Care Choice: NA (NONE) Living arrangements for the past 2 months: Independent Living Facility                   DME Agency: NA                  Prior Living Arrangements/Services Living arrangements for the past 2 months: Independent Living Facility Lives with:: Facility Resident Patient language and need for interpreter reviewed:: Yes Do you feel safe going back to the place where you live?: Yes      Need for Family Participation in Patient Care: Yes (Comment) Care giver support system in place?: Yes (comment) Current home services:  (NA) Criminal Activity/Legal Involvement Pertinent to Current Situation/Hospitalization: No - Comment as needed  Activities of Daily Living   ADL Screening (condition at time of admission) Independently performs ADLs?: Yes (appropriate for developmental age) Is the patient deaf or have difficulty hearing?: Yes Does the patient have difficulty seeing, even when wearing  glasses/contacts?: No Does the patient have difficulty concentrating, remembering, or making decisions?: No  Permission Sought/Granted Permission sought to share information with : Facility Industrial/product designer granted to share information with : Yes, Verbal Permission Granted              Emotional Assessment Appearance:: Appears stated age Attitude/Demeanor/Rapport: Engaged   Orientation: : Oriented to Self, Oriented to Place, Oriented to  Time, Oriented to Situation Alcohol / Substance Use: Other (comment) (quit smoking 35 years ago) Psych Involvement: No (comment)  Admission diagnosis:  COPD exacerbation (HCC) [J44.1] Sepsis, due to unspecified organism, unspecified whether acute organ dysfunction present [facility]) [A41.9] Patient Active Problem List   Diagnosis Date Noted   COPD exacerbation (HCC) 03/26/2023   Absolute anemia 03/17/2023   LLQ pain 03/17/2023   Chronic nausea 03/17/2023   Hypokalemia 02/23/2023   Diverticulitis 02/22/2023   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   Carpal tunnel syndrome, right upper limb 01/27/2023   Acute exacerbation of chronic obstructive pulmonary disease (COPD) (HCC) 01/13/2023   Dysuria 11/26/2022   Hypercoagulable state due to persistent atrial fibrillation (HCC) 11/18/2022   Acute on chronic heart failure with preserved ejection fraction (HCC) 10/26/2022   COPD with acute exacerbation (HCC) 10/09/2022   Pain in left shoulder 07/23/2022   Chronic obstructive pulmonary disease (HCC) 06/28/2022   Palliative care encounter 06/28/2022   Post-viral cough syndrome 06/02/2022   Paranasal sinus disease 05/19/2022   Heart  failure with reduced ejection fraction (HCC) 05/14/2022   Pneumonia 04/29/2022   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   Depression, major, single episode, moderate (HCC) 04/27/2022   Non-STEMI (non-ST elevated myocardial infarction) (HCC) 03/18/2022   Morbid obesity (HCC) 03/18/2022   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    Low TSH level 12/02/2021   Acute idiopathic gout of left hand 11/19/2021   Acquired hypothyroidism 11/12/2021   Encounter for palliative care involving management of pain 11/12/2021   Post concussive syndrome 11/11/2021   Chronic left-sided low back pain 11/04/2021   Gout 10/01/2021   Hyperlipidemia with target LDL less than 100 08/07/2021   Chronic anticoagulation 07/07/2021   Stage 3a chronic kidney disease (HCC) 05/23/2021   Dietary iron deficiency 05/23/2021   Age-related osteoporosis without current pathological fracture 04/22/2021   Type 2 diabetes mellitus with diabetic neuropathy, without long-term current use of insulin (HCC) 04/22/2021   Sleep apnea 01/22/2021   Statin intolerance 10/12/2019   Constipation    Statin myopathy 02/24/2019   Chronic systolic heart failure (HCC) 04/06/2018   HTN (hypertension) 04/06/2018   Atrial fibrillation (HCC) 04/06/2018   Chronic respiratory failure with hypoxia (HCC) 03/28/2018   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 [place] [address] Phone: [phone] Fax: [phone]  [facility] - [place], [place] - [address] [facility] [place] [employer] [address] [place] [address] Phone: [phone] Fax: [phone]  [facility] [address] [place] [address] Phone: [phone] Fax: [phone]     Social Determinants of Health (SDOH) Social History: SDOH Screenings   Food Insecurity: No Food Insecurity (03/27/2023)  Housing: Low Risk  (03/27/2023)  Transportation Needs: No  Transportation Needs (03/27/2023)  Utilities: Not At Risk (03/27/2023)  Alcohol Screen: Low Risk  (05/21/2022)  Depression (PHQ2-9): Low Risk  (05/21/2022)  Financial Resource Strain: Low Risk  (05/21/2022)  Physical Activity: Insufficiently Active (05/21/2022)  Social Connections: Moderately Isolated (05/21/2022)  Stress: No Stress Concern Present (05/21/2022)  Tobacco Use: Medium Risk (03/26/2023)   SDOH Interventions:     Readmission Risk Interventions    01/14/2023   11:16 AM 05/04/2022    3:37 PM  Readmission Risk Prevention Plan  Transportation Screening Complete Complete  PCP or Specialist Appt within 5-7 Days Complete Complete  Home Care Screening Complete Complete  Medication Review (RN CM) Complete Complete

## 2023-04-01 ENCOUNTER — Inpatient Hospital Stay (HOSPITAL_COMMUNITY): Payer: Medicare Other

## 2023-04-01 ENCOUNTER — Encounter (HOSPITAL_COMMUNITY): Payer: Medicare Other

## 2023-04-01 DIAGNOSIS — J9620 Acute and chronic respiratory failure, unspecified whether with hypoxia or hypercapnia: Secondary | ICD-10-CM

## 2023-04-01 DIAGNOSIS — J441 Chronic obstructive pulmonary disease with (acute) exacerbation: Secondary | ICD-10-CM | POA: Diagnosis not present

## 2023-04-01 DIAGNOSIS — N824 Other female intestinal-genital tract fistulae: Secondary | ICD-10-CM | POA: Diagnosis not present

## 2023-04-01 LAB — COMPREHENSIVE METABOLIC PANEL
ALT: 13 U/L (ref 0–44)
AST: 14 U/L — ABNORMAL LOW (ref 15–41)
Albumin: 2.8 g/dL — ABNORMAL LOW (ref 3.5–5.0)
Alkaline Phosphatase: 36 U/L — ABNORMAL LOW (ref 38–126)
Anion gap: 9 (ref 5–15)
BUN: 24 mg/dL — ABNORMAL HIGH (ref 8–23)
CO2: 25 mmol/L (ref 22–32)
Calcium: 9.4 mg/dL (ref 8.9–10.3)
Chloride: 102 mmol/L (ref 98–111)
Creatinine, Ser: 0.64 mg/dL (ref 0.44–1.00)
GFR, Estimated: >60 mL/min (ref >60)
Glucose, Bld: 188 mg/dL — ABNORMAL HIGH (ref 70–99)
Potassium: 4.6 mmol/L (ref 3.5–5.1)
Sodium: 136 mmol/L (ref 135–145)
Total Bilirubin: 0.4 mg/dL (ref 0.3–1.2)
Total Protein: 5.3 g/dL — ABNORMAL LOW (ref 6.5–8.1)

## 2023-04-01 LAB — CULTURE, BLOOD (ROUTINE X 2)
Culture: NO GROWTH
Culture: NO GROWTH
Special Requests: ADEQUATE

## 2023-04-01 LAB — CBC
HCT: 32.3 % — ABNORMAL LOW (ref 36.0–46.0)
Hemoglobin: 10.2 g/dL — ABNORMAL LOW (ref 12.0–15.0)
MCH: 27.4 pg (ref 26.0–34.0)
MCHC: 31.6 g/dL (ref 30.0–36.0)
MCV: 86.8 fL (ref 80.0–100.0)
Platelets: 251 10*3/uL (ref 150–400)
RBC: 3.72 MIL/uL — ABNORMAL LOW (ref 3.87–5.11)
RDW: 14.2 % (ref 11.5–15.5)
WBC: 14.7 10*3/uL — ABNORMAL HIGH (ref 4.0–10.5)
nRBC: 0.1 % (ref 0.0–0.2)

## 2023-04-01 LAB — PHOSPHORUS: Phosphorus: 3.4 mg/dL (ref 2.5–4.6)

## 2023-04-01 LAB — GLUCOSE, CAPILLARY
Glucose-Capillary: 148 mg/dL — ABNORMAL HIGH (ref 70–99)
Glucose-Capillary: 124 mg/dL — ABNORMAL HIGH (ref 70–99)
Glucose-Capillary: 209 mg/dL — ABNORMAL HIGH (ref 70–99)
Glucose-Capillary: 174 mg/dL — ABNORMAL HIGH (ref 70–99)

## 2023-04-01 LAB — MAGNESIUM: Magnesium: 1.9 mg/dL (ref 1.7–2.4)

## 2023-04-01 MED ORDER — BARIUM SULFATE 2 % PO SUSP
450.0000 mL | ORAL | Status: AC
  Administered 2023-04-01 (×2): 450 mL via ORAL

## 2023-04-01 MED ORDER — IOHEXOL 300 MG/ML  SOLN
100.0000 mL | Freq: Once | INTRAMUSCULAR | Status: AC | PRN
Start: 2023-04-01 — End: 2023-04-01
  Administered 2023-04-01: 100 mL via INTRAVENOUS

## 2023-04-01 MED ORDER — TORSEMIDE 20 MG PO TABS
60.0000 mg | ORAL_TABLET | Freq: Every day | ORAL | Status: DC
  Administered 2023-04-01 – 2023-04-04 (×4): 60 mg via ORAL
  Filled 2023-04-01 (×4): qty 3

## 2023-04-01 MED ORDER — MAGNESIUM SULFATE 2 GM/50ML IV SOLN
2.0000 g | Freq: Once | INTRAVENOUS | Status: AC
  Administered 2023-04-01: 2 g via INTRAVENOUS

## 2023-04-01 NOTE — Plan of Care (Signed)
  Problem: Education: Goal: Knowledge of General Education information will improve Description: Including pain rating scale, medication(s)/side effects and non-pharmacologic comfort measures Outcome: Progressing   Problem: Health Behavior/Discharge Planning: Goal: Ability to manage health-related needs will improve Outcome: Progressing   Problem: Clinical Measurements: Goal: Ability to maintain clinical measurements within normal limits will improve Outcome: Progressing Goal: Will remain free from infection Outcome: Progressing Goal: Diagnostic test results will improve Outcome: Progressing Goal: Respiratory complications will improve Outcome: Progressing Goal: Cardiovascular complication will be avoided Outcome: Progressing   Problem: Activity: Goal: Risk for activity intolerance will decrease Outcome: Progressing   Problem: Nutrition: Goal: Adequate nutrition will be maintained Outcome: Progressing   Problem: Coping: Goal: Level of anxiety will decrease Outcome: Progressing   Problem: Elimination: Goal: Will not experience complications related to bowel motility Outcome: Progressing Goal: Will not experience complications related to urinary retention Outcome: Progressing   Problem: Pain Managment: Goal: General experience of comfort will improve Outcome: Progressing   Problem: Safety: Goal: Ability to remain free from injury will improve Outcome: Progressing   Problem: Skin Integrity: Goal: Risk for impaired skin integrity will decrease Outcome: Progressing   Problem: Education: Goal: Ability to describe self-care measures that may prevent or decrease complications (Diabetes Survival Skills Education) will improve Outcome: Progressing Goal: Individualized Educational Video(s) Outcome: Progressing   Problem: Coping: Goal: Ability to adjust to condition or change in health will improve Outcome: Progressing   Problem: Fluid Volume: Goal: Ability to  maintain a balanced intake and output will improve Outcome: Progressing   Problem: Health Behavior/Discharge Planning: Goal: Ability to identify and utilize available resources and services will improve Outcome: Progressing Goal: Ability to manage health-related needs will improve Outcome: Progressing   Problem: Metabolic: Goal: Ability to maintain appropriate glucose levels will improve Outcome: Progressing   Problem: Nutritional: Goal: Maintenance of adequate nutrition will improve Outcome: Progressing   Problem: Nutritional: Goal: Maintenance of adequate nutrition will improve Outcome: Progressing Goal: Progress toward achieving an optimal weight will improve Outcome: Progressing   Problem: Skin Integrity: Goal: Risk for impaired skin integrity will decrease Outcome: Progressing   Problem: Tissue Perfusion: Goal: Adequacy of tissue perfusion will improve Outcome: Progressing

## 2023-04-01 NOTE — Progress Notes (Signed)
PROGRESS NOTE    Kathleen [name]  VZD:[id number] DOB: 11/30/1945 DOA: 03/26/2023 PCP: [name], MD   Brief Narrative:  This is a 77 year old female with past medical history of OSA (on BiPAP), BOOP (noncandidate for stenting), COPD, chronic respiratory failure 5L, HFrEF, A. fib (s/p AVN ablation, BiV, on anticoagulation), HTN, gout, and colovesical fistula. Patient presents with worsening respiratory status and temp at home as well as worsening abdominal pain.  She developed abdominal pain on left side but she does have acute diverticulitis noted on her most recent scan so we will added Flagyl.  She continues to have a significant cough and have added doxycycline.  We are ordering pulmonary toileting and lactic acid level was elevated but is trending down now.  Will continue antibiotic coverage currently and she is improving.  GI and pulm have consulted and is planning on repeating a CT scan of the abdomen pelvis on Thursday and pulmonary repeated a CT scan of the chest on 10/22.  Repeat CT abdomen pelvis planned 10/24.   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   Hypercoagulable state due to persistent atrial fibrillation (HCC)   Acute exacerbation of chronic obstructive pulmonary disease (COPD) (HCC)   Colovaginal fistula   Severe sepsis in the setting of Acute Diverticulitis with associated Colo vaginal fistula Lactic Acidosis  -Remains afebrile over the past few days -Leukocytosis labile but stable around 14 -Repeat CT abdomen pelvis today, pending read -GI following, appreciate insight recommendations Patient continues on  ceftriaxone, doxycycline, Flagyl   Acute exacerbation of COPD wih Pneumonia  Acute on chronic chronic respiratory failure with hypoxia (HCC) -Repeat CT chest on the 22nd did not show any notable airspace disease despite requiring 5 L nasal cannula, which is her baseline. -Pulmonology following, appreciate insight recommendations -Continue supportive care, nebs, steroids, mucolytics incentive spirometry and flutter    Chronic systolic heart failure (HCC) Not in acute exacerbation EF 35 to 40% per prior echo, BNP without elevation Edema stable in the setting of TED hose Demadex, torsemide, metolazone on hold since admission  Restart torsemide 10/24 -Follow potassium, may need to increase supplementation pending diuretic reinitiation   Prolonged QT -Initially 539 appropriately downtrending to 480 with electrolyte correction  Hypercoagulable state due to persistent atrial fibrillation (HCC) -On Tikosyn and Xarelto, continued -Trend and replete electrolytes as appropriate   OSA, BOOP -Pulmonology following, continue BiPAP as ordered   CAD  HTN (hypertension) -Well-controlled, continue losartan, aspirin on hold# Lung Nodule -Stable at 5 mm and Needs to be followed in the outpatient setting   Normocytic Anemia Iron deficiency anemia Resume iron supplementation postinfection, follow labs every 48 hours   Hypoalbuminemia -In the setting of malnutrition and chronic disease/anemia   Obesity Complicates overall prognosis and care Body mass index is 38.32 kg/m.   DVT prophylaxis: Place TED hose Start: 03/31/23 0952 rivaroxaban (XARELTO) tablet 20 mg   Code Status:   Code Status: Prior  Family Communication: None present  Status is: Inpatient  Dispo: The patient is from: Home              Anticipated d/c is to: To be  determined              Anticipated d/c date is: 48 to 72 hours              Patient currently not medically stable for discharge  Consultants:   Pulmonology, GI  Procedures:  Pending above  Antimicrobials:  Ceftriaxone, doxycycline, Flagyl  Subjective: No acute issues or events overnight denies nausea vomiting diarrhea constipation headache fevers chills or chest pain.  Abdominal pain at the anterior pupis radiating laterally anteriorly  Objective: Vitals:   03/31/23 2227 04/01/23 0500 04/01/23 0500 04/01/23 0758  BP:   129/64   Pulse: 71  70   Resp:   14   Temp:   97.8 F (36.6 C)   TempSrc:   Oral   SpO2: 98%  97% 97%  Weight:  89 kg    Height:        Intake/Output Summary (Last 24 hours) at 04/01/2023 0805 Last data filed at 04/01/2023 4132 Gross per 24 hour  Intake 600 ml  Output --  Net 600 ml   Filed Weights   03/30/23 0500 03/31/23 0500 04/01/23 0500  Weight: 90.9 kg 90 kg 89 kg    Examination:  General: Resting somewhat comfortably no acute distress HEENT:  Normocephalic atraumatic.  Sclerae nonicteric, noninjected.  Extraocular movements intact bilaterally. Neck:  Without mass or deformity.  Trachea is midline. Lungs: Diminished bilaterally. Heart:  Regular rate and rhythm.  Without murmurs, rubs, or gallops. Abdomen:  Soft, without rebound, moderately tender at the lower abdomen, PMI midline Extremities: 2+ pitting edema bilaterally.  Data Reviewed: I have personally reviewed following labs and imaging studies  CBC: Recent Labs  Lab 03/27/23 1418 03/28/23 0015 03/28/23 2117 03/29/23 1013 03/29/23 1459 03/29/23 2131 03/30/23 0708 03/31/23 0603 04/01/23 0526  WBC 16.0* 17.4*  --  14.6*  --   --  12.3* 16.4* 14.7*  NEUTROABS 15.0* 16.1*  --  12.3*  --   --  10.1* 14.3*  --   HGB 10.1* 9.9*   < > 11.0* 11.3* 11.1* 10.3* 10.2* 10.2*  HCT 32.3* 32.1*   < > 36.1 36.6 35.3* 33.6* 33.6* 32.3*  MCV 85.7 87.0  --  88.5  --   --  87.0 88.4 86.8  PLT 210 217  --  284  --   --  240 242 251   < > = values in this interval not displayed.   Basic Metabolic Panel: Recent Labs  Lab  03/28/23 0015 03/29/23 1013 03/30/23 0708 03/31/23 0603 04/01/23 0526  NA 134* 139 139 137 136  K 4.0 4.1 4.6 4.6 4.6  CL 100 102 105 105 102  CO2 23 22 24 25 25   GLUCOSE 198* 163* 173* 190* 188*  BUN 25* 21 22 23  24*  CREATININE 0.68 0.70 0.62 0.79 0.64  CALCIUM 9.0 10.3 9.8 9.7 9.4  MG 2.4 2.2 2.1 2.0 1.9  PHOS 2.0* 3.3 4.0 4.6 3.4   GFR: Estimated Creatinine Clearance: 59.4 mL/min (by C-G formula based on SCr of 0.64 mg/dL). Liver Function Tests: Recent Labs  Lab 03/28/23 0015 03/29/23 1013 03/30/23 0708 03/31/23 0603 04/01/23 0526  AST 16 17 18  14* 14*  ALT 11 13 14 13 13   ALKPHOS 48 46 39 36* 36*  BILITOT 0.5 0.6 0.4 0.6 0.4  PROT 6.0* 6.8 5.7* 5.5* 5.3*  ALBUMIN 3.2* 3.5 3.1* 2.9* 2.8*   No results for input(s): "LIPASE", "AMYLASE" in the last 168 hours. No results for  input(s): "AMMONIA" in the last 168 hours. Coagulation Profile: No results for input(s): "INR", "PROTIME" in the last 168 hours. Cardiac Enzymes: No results for input(s): "CKTOTAL", "CKMB", "CKMBINDEX", "TROPONINI" in the last 168 hours. BNP (last 3 results) No results for input(s): "PROBNP" in the last 8760 hours. HbA1C: No results for input(s): "HGBA1C" in the last 72 hours. CBG: Recent Labs  Lab 03/31/23 0730 03/31/23 1201 03/31/23 1654 03/31/23 2035 04/01/23 0721  GLUCAP 164* 130* 188* 120* 148*   Lipid Profile: No results for input(s): "CHOL", "HDL", "LDLCALC", "TRIG", "CHOLHDL", "LDLDIRECT" in the last 72 hours. Thyroid Function Tests: No results for input(s): "TSH", "T4TOTAL", "FREET4", "T3FREE", "THYROIDAB" in the last 72 hours. Anemia Panel: No results for input(s): "VITAMINB12", "FOLATE", "FERRITIN", "TIBC", "IRON", "RETICCTPCT" in the last 72 hours. Sepsis Labs: Recent Labs  Lab 03/27/23 0835 03/27/23 1023 03/28/23 0015 03/28/23 0614 03/28/23 1632 03/28/23 1826  PROCALCITON 0.14  --   --   --   --   --   LATICACIDVEN 3.7*   < > 2.9* 3.7* 3.6* 2.2*   < > = values in  this interval not displayed.    Recent Results (from the past 240 hour(s))  Resp panel by RT-PCR (RSV, Flu A&B, Covid) Anterior Nasal Swab     Status: None   Collection Time: 03/26/23  8:16 PM   Specimen: Anterior Nasal Swab  Result Value Ref Range Status   SARS Coronavirus 2 by RT PCR NEGATIVE NEGATIVE Final    Comment: (NOTE) SARS-CoV-2 target nucleic acids are NOT DETECTED.  The SARS-CoV-2 RNA is generally detectable in upper respiratory specimens during the acute phase of infection. The lowest concentration of SARS-CoV-2 viral copies this assay can detect is 138 copies/mL. A negative result does not preclude SARS-Cov-2 infection and should not be used as the sole basis for treatment or other patient management decisions. A negative result may occur with  improper specimen collection/handling, submission of specimen other than nasopharyngeal swab, presence of viral mutation(s) within the areas targeted by this assay, and inadequate number of viral copies(<138 copies/mL). A negative result must be combined with clinical observations, patient history, and epidemiological information. The expected result is Negative.  Fact Sheet for Patients:  BloggerCourse.com  Fact Sheet for Healthcare Providers:  SeriousBroker.it  This test is no t yet approved or cleared by the [place] FDA and  has been authorized for detection and/or diagnosis of SARS-CoV-2 by FDA under an Emergency Use Authorization (EUA). This EUA will remain  in effect (meaning this test can be used) for the duration of the COVID-19 declaration under Section 564(b)(1) of the Act, 21 U.S.C.section 360bbb-3(b)(1), unless the authorization is terminated  or revoked sooner.       Influenza A by PCR NEGATIVE NEGATIVE Final   Influenza B by PCR NEGATIVE NEGATIVE Final    Comment: (NOTE) The Xpert Xpress SARS-CoV-2/FLU/RSV plus assay is intended as an aid in the  diagnosis of influenza from Nasopharyngeal swab specimens and should not be used as a sole basis for treatment. Nasal washings and aspirates are unacceptable for Xpert Xpress SARS-CoV-2/FLU/RSV testing.  Fact Sheet for Patients: BloggerCourse.com  Fact Sheet for Healthcare Providers: SeriousBroker.it  This test is not yet approved or cleared by the [place] FDA and has been authorized for detection and/or diagnosis of SARS-CoV-2 by FDA under an Emergency Use Authorization (EUA). This EUA will remain in effect (meaning this test can be used) for the duration of the COVID-19 declaration under Section 564(b)(1) of the  Act, 21 U.S.C. section 360bbb-3(b)(1), unless the authorization is terminated or revoked.     Resp Syncytial Virus by PCR NEGATIVE NEGATIVE Final    Comment: (NOTE) Fact Sheet for Patients: BloggerCourse.com  Fact Sheet for Healthcare Providers: SeriousBroker.it  This test is not yet approved or cleared by the [place] FDA and has been authorized for detection and/or diagnosis of SARS-CoV-2 by FDA under an Emergency Use Authorization (EUA). This EUA will remain in effect (meaning this test can be used) for the duration of the COVID-19 declaration under Section 564(b)(1) of the Act, 21 U.S.C. section 360bbb-3(b)(1), unless the authorization is terminated or revoked.  Performed at [facility], [address] [place], [place] [address]   Blood culture (routine x 2)     Status: None   Collection Time: 03/26/23  8:20 PM   Specimen: BLOOD  Result Value Ref Range Status   Specimen Description   Final    BLOOD LEFT ANTECUBITAL Performed at [facility], [address], [place], [place] [address]    Special Requests   Final    BOTTLES DRAWN AEROBIC AND ANAEROBIC Blood Culture results may not be optimal due to an  excessive volume of blood received in culture bottles Performed at [facility], [address] [place], [place] [address]    Culture   Final    NO GROWTH 5 DAYS Performed at [facility], [address], [place], [place] [address]    Report Status 04/01/2023 FINAL  Final  Blood culture (routine x 2)     Status: None   Collection Time: 03/26/23  8:30 PM   Specimen: BLOOD  Result Value Ref Range Status   Specimen Description   Final    BLOOD RIGHT ANTECUBITAL Performed at [facility], [address], [place], [place] [address]    Special Requests   Final    BOTTLES DRAWN AEROBIC AND ANAEROBIC Blood Culture adequate volume Performed at [facility], [address] [place], [place] [address]    Culture   Final    NO GROWTH 5 DAYS Performed at [facility], [address] [place], [place] [address]    Report Status 04/01/2023 FINAL  Final  Respiratory (~20 pathogens) panel by PCR     Status: None   Collection Time: 03/27/23  1:02 PM   Specimen: Nasopharyngeal Swab; Respiratory  Result Value Ref Range Status   Adenovirus NOT DETECTED NOT DETECTED Final   Coronavirus 229E NOT DETECTED NOT DETECTED Final    Comment: (NOTE) The Coronavirus on the Respiratory Panel, DOES NOT test for the novel  Coronavirus (2019 nCoV)    Coronavirus HKU1 NOT DETECTED NOT DETECTED Final   Coronavirus NL63 NOT DETECTED NOT DETECTED Final   Coronavirus OC43 NOT DETECTED NOT DETECTED Final   Metapneumovirus NOT DETECTED NOT DETECTED Final   Rhinovirus / Enterovirus NOT DETECTED NOT DETECTED Final   Influenza A NOT DETECTED NOT DETECTED Final   Influenza B NOT DETECTED NOT DETECTED Final   Parainfluenza Virus 1 NOT DETECTED NOT DETECTED Final   Parainfluenza Virus 2 NOT DETECTED NOT DETECTED Final   Parainfluenza Virus 3 NOT DETECTED NOT DETECTED Final   Parainfluenza Virus 4 NOT DETECTED NOT DETECTED Final   Respiratory  Syncytial Virus NOT DETECTED NOT DETECTED Final   Bordetella pertussis NOT DETECTED NOT DETECTED Final   Bordetella Parapertussis NOT DETECTED NOT DETECTED Final   Chlamydophila pneumoniae NOT DETECTED NOT DETECTED Final   Mycoplasma  pneumoniae NOT DETECTED NOT DETECTED Final    Comment: Performed at [facility], [address], [place], [place] [address]         Radiology Studies: DG CHEST PORT 1 VIEW  Result Date: 03/31/2023 CLINICAL DATA:  Shortness of breath. EXAM: PORTABLE CHEST 1 VIEW COMPARISON:  Chest radiograph and CT 03/30/2023 FINDINGS: A pacemaker remains in place. The cardiac silhouette remains mildly enlarged. Pulmonary vascular congestion is similar to the prior radiograph, as are mild bibasilar opacities. No large pleural effusion or pneumothorax is identified. IMPRESSION: Unchanged pulmonary vascular congestion and bibasilar atelectasis. Electronically Signed   By: Sebastian Ache M.D.   On: 03/31/2023 09:24        Scheduled Meds:  dextromethorphan-guaiFENesin  1 tablet Oral BID   dofetilide  250 mcg Oral BID   doxycycline  100 mg Oral Q12H   fluticasone furoate-vilanterol  1 puff Inhalation Daily   gabapentin  200 mg Oral QHS   insulin aspart  0-15 Units Subcutaneous TID WC   insulin aspart  0-5 Units Subcutaneous QHS   levalbuterol  0.63 mg Nebulization TID   losartan  12.5 mg Oral Daily   methylPREDNISolone (SOLU-MEDROL) injection  40 mg Intravenous Q12H   pantoprazole  40 mg Oral Daily   potassium chloride SA  40 mEq Oral Daily   revefenacin  175 mcg Nebulization Daily   rivaroxaban  20 mg Oral Q supper   sodium chloride HYPERTONIC  4 mL Nebulization Daily   Continuous Infusions:  cefTRIAXone (ROCEPHIN)  IV Stopped (03/31/23 1050)   metronidazole Stopped (03/31/23 2302)     LOS: 6 days   Time spent:  Azucena Fallen, DO [facility]  If 7PM-7AM, please contact night-coverage [email or web address]  04/01/2023, 8:05 AM

## 2023-04-01 NOTE — Plan of Care (Signed)
  Problem: Education: Goal: Knowledge of General Education information will improve Description: Including pain rating scale, medication(s)/side effects and non-pharmacologic comfort measures 04/01/2023 1629 by Kathryne Eriksson, RN Outcome: Progressing 04/01/2023 1629 by Kathryne Eriksson, RN Outcome: Progressing   Problem: Health Behavior/Discharge Planning: Goal: Ability to manage health-related needs will improve 04/01/2023 1629 by Kathryne Eriksson, RN Outcome: Progressing 04/01/2023 1629 by Kathryne Eriksson, RN Outcome: Progressing   Problem: Clinical Measurements: Goal: Ability to maintain clinical measurements within normal limits will improve 04/01/2023 1629 by Kathryne Eriksson, RN Outcome: Progressing 04/01/2023 1629 by Kathryne Eriksson, RN Outcome: Progressing Goal: Will remain free from infection 04/01/2023 1629 by Kathryne Eriksson, RN Outcome: Progressing 04/01/2023 1629 by Kathryne Eriksson, RN Outcome: Progressing Goal: Diagnostic test results will improve 04/01/2023 1629 by Kathryne Eriksson, RN Outcome: Progressing 04/01/2023 1629 by Kathryne Eriksson, RN Outcome: Progressing Goal: Respiratory complications will improve 04/01/2023 1629 by Kathryne Eriksson, RN Outcome: Progressing 04/01/2023 1629 by Kathryne Eriksson, RN Outcome: Progressing Goal: Cardiovascular complication will be avoided 04/01/2023 1629 by Kathryne Eriksson, RN Outcome: Progressing 04/01/2023 1629 by Kathryne Eriksson, RN Outcome: Progressing   Problem: Activity: Goal: Risk for activity intolerance will decrease 04/01/2023 1629 by Kathryne Eriksson, RN Outcome: Progressing 04/01/2023 1629 by Kathryne Eriksson, RN Outcome: Progressing   Problem: Nutrition: Goal: Adequate nutrition will be maintained 04/01/2023 1629 by Kathryne Eriksson, RN Outcome: Progressing 04/01/2023 1629 by Kathryne Eriksson, RN Outcome: Progressing   Problem: Coping: Goal: Level of anxiety will decrease Outcome: Progressing   Problem:  Elimination: Goal: Will not experience complications related to bowel motility Outcome: Progressing Goal: Will not experience complications related to urinary retention Outcome: Progressing   Problem: Pain Managment: Goal: General experience of comfort will improve Outcome: Progressing   Problem: Safety: Goal: Ability to remain free from injury will improve Outcome: Progressing   Problem: Skin Integrity: Goal: Risk for impaired skin integrity will decrease Outcome: Progressing   Problem: Education: Goal: Ability to describe self-care measures that may prevent or decrease complications (Diabetes Survival Skills Education) will improve Outcome: Progressing Goal: Individualized Educational Video(s) Outcome: Progressing   Problem: Coping: Goal: Ability to adjust to condition or change in health will improve Outcome: Progressing   Problem: Fluid Volume: Goal: Ability to maintain a balanced intake and output will improve Outcome: Progressing   Problem: Health Behavior/Discharge Planning: Goal: Ability to identify and utilize available resources and services will improve Outcome: Progressing Goal: Ability to manage health-related needs will improve Outcome: Progressing   Problem: Metabolic: Goal: Ability to maintain appropriate glucose levels will improve Outcome: Progressing   Problem: Nutritional: Goal: Maintenance of adequate nutrition will improve Outcome: Progressing Goal: Progress toward achieving an optimal weight will improve Outcome: Progressing   Problem: Skin Integrity: Goal: Risk for impaired skin integrity will decrease Outcome: Progressing   Problem: Tissue Perfusion: Goal: Adequacy of tissue perfusion will improve Outcome: Progressing

## 2023-04-01 NOTE — Progress Notes (Signed)
Physical Therapy Treatment Patient Details Name: ARMETHA REDDEN MRN: [id number] DOB: 07/12/1945 Today's Date: 04/01/2023   History of Present Illness The pt is a 77 yo female presenting 10/18 with Acute exacerbation COPD, Pneumonia, lactic acidosis, severe sepsis   Chronic respiratory failure with hypoxia. Pt with admission last month to [facility] with brown stool/discharge from vagina x1 week. 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  August previous admission to [facility] for COPD exacerbation.  PMH includes: COPD on 4-5L O2, asthma, OSA on bipap, afib, CHF.    PT Comments  General Comments: AxO x 3 pleasant and motivated    Lives home alone and uses a Occupational hygienist for [employer] use.   Stated she has been using oxygen for "months".   Arrived to room pt was in bathroom with RN.  General transfer comment: obserered pt in bathroom self performing all peri care as well as transfer. General Gait Details: tolerated amb a functional distance in hallway 110 feet x 2 with a walker with one standing rest break.  Remained on 6 lts oxygen with sats increasing from 95% to 100% with activity.  Dyspnea only 1/4.  Pt self aware of "Purse Lib" breathing and self aware og her physical limitations.  Pt tolerated session well and plans to return home with [facility].    If plan is discharge home, recommend the following: A little help with walking and/or transfers;Help with stairs or ramp for entrance;A little help with bathing/dressing/bathroom   Can travel by private vehicle        Equipment Recommendations  oxygen   Recommendations for Other Services       Precautions / Restrictions Precautions Precautions: Fall Precaution Comments: Keep SpO2 above 88% Restrictions Weight Bearing Restrictions: No     Mobility  Bed Mobility               General bed mobility comments: OOB in bathroom on arrival    Transfers Overall transfer level: Needs assistance Equipment used: Rolling walker (2  wheels) Transfers: Sit to/from Stand Sit to Stand: Supervision           General transfer comment: obserered pt in bathroom self performing all peri care as well as transfer.    Ambulation/Gait Ambulation/Gait assistance: Supervision Gait Distance (Feet): 220 Feet (110 feet x 2) Assistive device: Rolling walker (2 wheels) Gait Pattern/deviations: Step-to pattern, Trunk flexed Gait velocity: decreased     General Gait Details: tolerated amb a functional distance in hallway 110 feet x 2 with a walker with one standing rest break.  Remained on 6 lts oxygen with sats increasing from 95% to 100% with activity.  Dyspnea only 1/4.  Pt self aware of "Purse Lib" breathing and self aware og her physical limitations.   Stairs             Wheelchair Mobility     Tilt Bed    Modified Rankin (Stroke Patients Only)       Balance                                            Cognition Arousal: Alert Behavior During Therapy: WFL for tasks assessed/performed Overall Cognitive Status: Within Functional Limits for tasks assessed  General Comments: AxO x 3 pleasant and motivated    Lives home alone and uses a Occupational hygienist for [employer] use.        Exercises      General Comments        Pertinent Vitals/Pain Pain Assessment Pain Assessment: No/denies pain Pain Descriptors / Indicators: Tingling    Home Living                          Prior Function            PT Goals (current goals can now be found in the care plan section) Progress towards PT goals: Progressing toward goals    Frequency    Min 1X/week      PT Plan      Co-evaluation              AM-PAC PT "6 Clicks" Mobility   Outcome Measure  Help needed turning from your back to your side while in a flat bed without using bedrails?: None Help needed moving from lying on your back to sitting on the side of a flat bed  without using bedrails?: None Help needed moving to and from a bed to a chair (including a wheelchair)?: None Help needed standing up from a chair using your arms (e.g., wheelchair or bedside chair)?: None Help needed to walk in hospital room?: A Little Help needed climbing 3-5 steps with a railing? : A Little 6 Click Score: 22    End of Session Equipment Utilized During Treatment: Gait belt Activity Tolerance: Patient tolerated treatment well Patient left: in chair;with call bell/phone within reach Nurse Communication: Mobility status PT Visit Diagnosis: Other abnormalities of gait and mobility (R26.89);Difficulty in walking, not elsewhere classified (R26.2)     Time: 6644-0347 PT Time Calculation (min) (ACUTE ONLY): 22 min  Charges:    $Gait Training: 8-22 mins PT General Charges $$ ACUTE PT VISIT: 1 Visit                     Felecia Shelling  PTA Acute  Rehabilitation Services Office M-F          [phone]

## 2023-04-01 NOTE — Progress Notes (Signed)
   04/01/23 2331  BiPAP/CPAP/SIPAP  BiPAP/CPAP/SIPAP Pt Type Adult  BiPAP/CPAP/SIPAP DREAMSTATIOND  Mask Type Full face mask (home mask)  Mask Size Medium  Respiratory Rate 18 breaths/min  IPAP 12 cmH20  EPAP 8 cmH2O  Flow Rate 5 lpm  Auto Titrate No

## 2023-04-01 NOTE — Progress Notes (Signed)
NAME:  Kathleen [name], MRN:  [id number], DOB:  1946-05-17, LOS: 6 ADMISSION DATE:  03/26/2023, CONSULTATION DATE:  03/29/2023 REFERRING MD:  Dr. Marland Mcalpine - [facility], CHIEF COMPLAINT:  Acute respiratory distress    History of Present Illness:  Kathleen [name] is a 77 y.o. with a past medical history significant for Boop, OSA on BIPAP, COPD, chronic hypoxic respiratory failure on 5 L nasal cannula at baseline, spontaneous pneumothorax, asthma CHF, complete heart block now s/pacemaker, HFrEF, HTN, atrial fibrillation, and colovesicular fistula who presented to the ED 10/18 with complaint of nonproductive cough with wheezing and low-grade fever.  Pertinent  Medical History  Boop, OSA on BIPAP, COPD, chronic hypoxic respiratory failure on 5 L nasal cannula at baseline, spontaneous pneumothorax, asthma CHF, complete heart block now s/pacemaker, HFrEF, HTN, atrial fibrillation, and colovesicular fistula   Significant Hospital Events: Including procedures, antibiotic start and stop dates in addition to other pertinent events   10/18 presented with both GI and respiratory complaints with extensive pulmonary history 10/21 PCCM consulted  10/23 - Feels about the same. Cough remains biggest complaint  Interim History / Subjective:  .   10/24 - still on 5L [place] says a year ago o2 need went up from 2 -> 4l [place] and for last few weeks on 5L [place].  She thinks she has pneumonia bu CT chest 03/30/23 shows emphsyeam with cor pulmonarle and basal atlectasis  C.o wworsening edema - known chronic s-CHF ef 40% , last echo April 2024. BNP  normal this admit but eedema is new  Objective   Blood pressure 129/64, pulse 70, temperature 97.8 F (36.6 C), temperature source Oral, resp. rate 14, height 5' (1.524 m), weight 89 kg, SpO2 97%.        Intake/Output Summary (Last 24 hours) at 04/01/2023 1159 Last data filed at 04/01/2023 0614 Gross per 24 hour  Intake 600 ml  Output --  Net 600 ml    Filed Weights   03/30/23  0500 03/31/23 0500 04/01/23 0500  Weight: 90.9 kg 90 kg 89 kg    Examination: General: No distress. Obese. Sitting In chair. On o2 5L [place] Neuro: Alert and Oriented x 3. GCS 15. Speech normal Psych: Pleasant Resp:  Barrel Chest - no.  Wheeze - no but corase and lot of transmitted UA sounds, Crackles - no, No overt respiratory distress CVS: Normal heart sounds. Murmurs - no Ext: Stigmata of Connective Tissue Disease - no HEENT: Normal upper airway. PEERL +. 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 I asked her about the allergies and joint pain was so severe she does not want to escalate therapy. Currently on Breo and yupelri - Solumedrol continue 40mg  BID day 4 >will target a short course considering diverticulitis.  -  Tussionex for course - Incentive spirometry and flutter valve - Will try hypertonic nebs to help with mucous clearance.  - Doxy and Flagyl  > GI suggested possibly transitioning to Zosyn, per primary.  - Weight is up, BNP low. Defer diuretics for now.  - outpatient follow up for nodule.   Other problems per primary - Acute diverticulitis - for repeat CT 10/24 - Colovaginal fistula  Best Practice (right click and "Reselect all SmartList Selections"  daily)  Per primary    SIGNATURE    Dr. Kalman Shan, M.D., F.C.C.P,  Pulmonary and Critical Care Medicine Staff Physician, [facility] Center Director - Interstitial Lung Disease  Program  Pulmonary Fibrosis [facility] at [facility] [place], [place], [address]   Pager: [phone], If no answer  -> Check AMION or Try [phone] Telephone (clinical office): [phone] Telephone (research): [phone]  11:59 AM 04/01/2023    LABS    PULMONARY No results for input(s): "PHART", "PCO2ART", "PO2ART", "HCO3", "TCO2", "O2SAT" in the last 168 hours.  Invalid input(s): "PCO2", "PO2"  CBC Recent Labs  Lab 03/30/23 0708 03/31/23 0603 04/01/23 0526  HGB 10.3* 10.2* 10.2*  HCT 33.6* 33.6* 32.3*  WBC 12.3* 16.4* 14.7*  PLT 240 242 251    COAGULATION No results for input(s): "INR" in the last 168 hours.  CARDIAC  No results for input(s): "TROPONINI" in the last 168 hours. No results for input(s): "PROBNP" in the last 168 hours.   CHEMISTRY Recent Labs  Lab 03/28/23 0015 03/29/23 1013 03/30/23 0708 03/31/23 0603 04/01/23 0526  NA 134* 139 139 137 136  K 4.0 4.1 4.6 4.6 4.6  CL 100 102 105 105 102  CO2 23 22 24 25 25   GLUCOSE 198* 163* 173* 190* 188*  BUN 25* 21 22 23  24*  CREATININE 0.68 0.70 0.62 0.79 0.64  CALCIUM 9.0 10.3 9.8 9.7 9.4  MG 2.4 2.2 2.1 2.0 1.9  PHOS 2.0* 3.3 4.0 4.6 3.4   Estimated Creatinine Clearance: 59.4 mL/min (by C-G formula based on SCr of 0.64 mg/dL).   LIVER Recent Labs  Lab 03/28/23 0015 03/29/23 1013 03/30/23 0708 03/31/23 0603 04/01/23 0526  AST 16 17 18  14* 14*  ALT 11 13 14 13 13   ALKPHOS 48 46 39 36* 36*  BILITOT 0.5 0.6 0.4 0.6 0.4  PROT 6.0* 6.8 5.7* 5.5* 5.3*  ALBUMIN 3.2* 3.5 3.1* 2.9* 2.8*     INFECTIOUS Recent Labs  Lab 03/27/23 0835 03/27/23 1023 03/28/23 0614 03/28/23 1632 03/28/23 1826  LATICACIDVEN 3.7*   < > 3.7* 3.6* 2.2*  PROCALCITON 0.14  --   --    --   --    < > = values in this interval not displayed.     ENDOCRINE CBG (last 3)  Recent Labs    03/31/23 2035 04/01/23 0721 04/01/23 1143  GLUCAP 120* 148* 124*         IMAGING x48h  - image(s) personally visualized  -   highlighted in bold DG CHEST PORT 1 VIEW  Result Date: 03/31/2023 CLINICAL DATA:  Shortness of breath. EXAM: PORTABLE CHEST 1 VIEW COMPARISON:  Chest radiograph and CT 03/30/2023 FINDINGS: A pacemaker remains in place. The cardiac silhouette remains mildly enlarged. Pulmonary vascular congestion is similar to the prior radiograph, as are mild bibasilar opacities. No large pleural effusion or pneumothorax is identified. IMPRESSION: Unchanged pulmonary vascular congestion and bibasilar atelectasis. Electronically Signed   By: Freida Busman  Mosetta Putt M.D.   On: 03/31/2023 09:24

## 2023-04-01 NOTE — Progress Notes (Addendum)
Progress Note  Primary GI: Dr. Meridee Score DOA: 03/26/2023         Hospital Day: 7   Subjective  Chief Complaint: Persistent sigmoid diverticulitis with colovaginal fistula  No family was present at the time of my evaluation. Patient still coughing but improved from yesterday. Patient had small-volume bright red blood on vaginal pad this morning otherwise no further bleeding continues to have dysuria with burning.  States dysphagia has improved slightly. Continues to have suprapubic and lower abdominal discomfort. Patient also had another episode of chest discomfort today no pain with palpation, no associated symptoms with it.    Objective   Vital signs in last 24 hours: Temp:  [97.8 F (36.6 C)-98.1 F (36.7 C)] 97.8 F (36.6 C) (10/24 0500) Pulse Rate:  [70-73] 70 (10/24 0500) Resp:  [14-20] 14 (10/24 0500) BP: (129-145)/(64-73) 129/64 (10/24 0500) SpO2:  [97 %-100 %] 97 % (10/24 0758) Weight:  [89 kg] 89 kg (10/24 0500) Last BM Date : 03/31/23 Last BM recorded by nurses in past 5 days No data recorded  General: Chronically ill-appearing female in no acute distress  Heart:  Regular rate and rhythm; no murmurs Pulm: Anteriorly with expiratory wheezing, right worse than left, decreased breath sounds bilaterally, harsh cough, 5 L cannula Abdomen:  Soft, Obese AB, active bowel sounds.  Mild to moderate tenderness lower abdomen. Without guarding and Without rebound Extremities:  with bilateral edema into her toes and forefoot. Neurologic:  Alert and  oriented x4;  No focal deficits.  Psych:  Cooperative. Normal mood and affect.  Intake/Output from previous day: 10/23 0701 - 10/24 0700 In: 600 [IV Piggyback:600] Out: -  Intake/Output this shift: No intake/output data recorded.  Studies/Results: DG CHEST PORT 1 VIEW  Result Date: 03/31/2023 CLINICAL DATA:  Shortness of breath. EXAM: PORTABLE CHEST 1 VIEW COMPARISON:  Chest radiograph and CT 03/30/2023 FINDINGS: A  pacemaker remains in place. The cardiac silhouette remains mildly enlarged. Pulmonary vascular congestion is similar to the prior radiograph, as are mild bibasilar opacities. No large pleural effusion or pneumothorax is identified. IMPRESSION: Unchanged pulmonary vascular congestion and bibasilar atelectasis. Electronically Signed   By: Sebastian Ache M.D.   On: 03/31/2023 09:24    Lab Results: Recent Labs    03/30/23 0708 03/31/23 0603 04/01/23 0526  WBC 12.3* 16.4* 14.7*  HGB 10.3* 10.2* 10.2*  HCT 33.6* 33.6* 32.3*  PLT 240 242 251   BMET Recent Labs    03/30/23 0708 03/31/23 0603 04/01/23 0526  NA 139 137 136  K 4.6 4.6 4.6  CL 105 105 102  CO2 24 25 25   GLUCOSE 173* 190* 188*  BUN 22 23 24*  CREATININE 0.62 0.79 0.64  CALCIUM 9.8 9.7 9.4   LFT Recent Labs    04/01/23 0526  PROT 5.3*  ALBUMIN 2.8*  AST 14*  ALT 13  ALKPHOS 36*  BILITOT 0.4   PT/INR No results for input(s): "LABPROT", "INR" in the last 72 hours.   Scheduled Meds:  barium  450 mL Oral Q1 Hr x 2   dextromethorphan-guaiFENesin  1 tablet Oral BID   dofetilide  250 mcg Oral BID   doxycycline  100 mg Oral Q12H   fluticasone furoate-vilanterol  1 puff Inhalation Daily   gabapentin  200 mg Oral QHS   insulin aspart  0-15 Units Subcutaneous TID WC   insulin aspart  0-5 Units Subcutaneous QHS   levalbuterol  0.63 mg Nebulization TID   losartan  12.5 mg Oral  Daily   methylPREDNISolone (SOLU-MEDROL) injection  40 mg Intravenous Q12H   pantoprazole  40 mg Oral Daily   potassium chloride SA  40 mEq Oral Daily   revefenacin  175 mcg Nebulization Daily   rivaroxaban  20 mg Oral Q supper   sodium chloride HYPERTONIC  4 mL Nebulization Daily   Continuous Infusions:  cefTRIAXone (ROCEPHIN)  IV Stopped (03/31/23 1050)   metronidazole 500 mg (04/01/23 6578)     Impression/Plan:   Acute sigmoid diverticulitis complicated by colovaginal fistula IV Rocephin, metronidazole and doxycycline day 5 Has  dysuria, small volume blood and stool vaginally.  CT AB and pelvis with contrast today  Patient remains high risk for endoscopic or surgical intervention, scheduled for colonoscopy 11/25 with Dr. Meridee Score, has outpatient appointment scheduled Dr. Michaell Cowing for considering a potential diverting colostomy  Hematochezia on Xarelto Likely secondary to diverticular disease versus friability/ulceration along fistula, small volume of BRB blood on pad this AM Hemoglobin 10.2 stable Continue to monitor CBC transfuse to keep greater than 7, need to monitor fluid overload Scheduled for colonoscopy 11/25 with Dr. Meridee Score, high risk for procedures, continue to monitor  Dysphagia Possibly GERD related versus candidal esophagitis with patient's steroids and antibiotics versus doxy PO since 10/20 continue Protonix 40 mg oral once daily Consider empirically treating for yeast esophagitis  Chest discomfort 1 episode yesterday and today, 20 mins, no associated symptoms Patient on telemetry Potentially from esophagitis, on protonix Monitor for ACS  Acute on chronic respiratory failure-5 L at baseline Currently being treated for pneumonia, COPD exacerbation with above antibiotics and Solu-Medrol CT chest without contrast 10/21 no acute airspace opacity, dilated main pulmonary artery setting pulm hypertension, coronary artery calcifications, emphysema and cardiomegaly Continues to have coughing, shortness of breath  Leukocytosis down treading, patient on steroids  Sleep apnea BiPAP  Prolonged QT  History of A-fib with SSS status post pacemaker On Xarelto  HFr EF 35 to 40% echo 09/2022   Active Problems:   Chronic respiratory failure with hypoxia (HCC)   Chronic systolic heart failure (HCC)   HTN (hypertension)   Atrial fibrillation (HCC)   Sepsis (HCC)   Chronic anticoagulation   Hyperlipidemia with target LDL less than 100   Chronic left-sided low back pain   Acquired hypothyroidism    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   Hypercoagulable state due to persistent atrial fibrillation (HCC)   Acute exacerbation of chronic obstructive pulmonary disease (COPD) (HCC)   Colovaginal fistula    LOS: 6 days   Doree Albee  04/01/2023, 9:59 AM   Attending physician's note   I have taken history, reviewed the chart and examined the patient. I performed a substantive portion of this encounter, including complete performance of at least one of the key components, in conjunction with the APP. I agree with the Advanced Practitioner's note, impression and recommendations.   Seen earlier this morning Much improved.  Had good BM after CT contrast.  Minimal blood from vaginal vault.  Note that she is on Xarelto.  Hb stable.  CT Abdo/pelvis also with significant improvement diverticulitis.  Persistent colovaginal fistula (as expected). CT reviewed  Acute sigmoid diverticulitis with colovaginal fistula.   Adm d/t respiratory failure w/t pneumonia/underlying chronic respiratory failure (5lit/min O2 @baseline )- Frail pulmonary status.  Appreciate pulmonary consultation. A-fib on Xarelto/CHB s/p pacemaker OSA on BiPAP CHF (EF 35-40%)   Plan: -Continue antibiotics (Doxy/Flagyl) x total of 14 days. -Colonoscopy would be contraindicated at this time due to acute diverticulitis/frail  pulmonary status. -She is already scheduled with Dr. Gretta Cool on 05/03/2023 -She also has appointment with Dr. Michaell Cowing as outpatient -Will sign off for now. Pl call if any questions.   Edman Circle, MD [facility] [phone]

## 2023-04-02 ENCOUNTER — Telehealth: Payer: Self-pay | Admitting: Pulmonary Disease

## 2023-04-02 ENCOUNTER — Ambulatory Visit: Payer: Self-pay | Admitting: Licensed Clinical Social Worker

## 2023-04-02 DIAGNOSIS — J9620 Acute and chronic respiratory failure, unspecified whether with hypoxia or hypercapnia: Secondary | ICD-10-CM | POA: Diagnosis not present

## 2023-04-02 DIAGNOSIS — J441 Chronic obstructive pulmonary disease with (acute) exacerbation: Secondary | ICD-10-CM | POA: Diagnosis not present

## 2023-04-02 LAB — BASIC METABOLIC PANEL
Anion gap: 13 (ref 5–15)
BUN: 31 mg/dL — ABNORMAL HIGH (ref 8–23)
CO2: 28 mmol/L (ref 22–32)
Calcium: 9.8 mg/dL (ref 8.9–10.3)
Chloride: 96 mmol/L — ABNORMAL LOW (ref 98–111)
Creatinine, Ser: 0.83 mg/dL (ref 0.44–1.00)
GFR, Estimated: >60 mL/min (ref >60)
Glucose, Bld: 155 mg/dL — ABNORMAL HIGH (ref 70–99)
Potassium: 3.7 mmol/L (ref 3.5–5.1)
Sodium: 137 mmol/L (ref 135–145)

## 2023-04-02 LAB — GLUCOSE, CAPILLARY
Glucose-Capillary: 142 mg/dL — ABNORMAL HIGH (ref 70–99)
Glucose-Capillary: 146 mg/dL — ABNORMAL HIGH (ref 70–99)
Glucose-Capillary: 213 mg/dL — ABNORMAL HIGH (ref 70–99)
Glucose-Capillary: 144 mg/dL — ABNORMAL HIGH (ref 70–99)

## 2023-04-02 LAB — CBC
HCT: 37.9 % (ref 36.0–46.0)
Hemoglobin: 11.7 g/dL — ABNORMAL LOW (ref 12.0–15.0)
MCH: 26.5 pg (ref 26.0–34.0)
MCHC: 30.9 g/dL (ref 30.0–36.0)
MCV: 85.7 fL (ref 80.0–100.0)
Platelets: 312 10*3/uL (ref 150–400)
RBC: 4.42 MIL/uL (ref 3.87–5.11)
RDW: 14.5 % (ref 11.5–15.5)
WBC: 15.3 10*3/uL — ABNORMAL HIGH (ref 4.0–10.5)
nRBC: 0 % (ref 0.0–0.2)

## 2023-04-02 LAB — BRAIN NATRIURETIC PEPTIDE: B Natriuretic Peptide: 71.5 pg/mL (ref 0.0–100.0)

## 2023-04-02 MED ORDER — TRAZODONE HCL 50 MG PO TABS
25.0000 mg | ORAL_TABLET | Freq: Every evening | ORAL | Status: DC | PRN
  Administered 2023-04-02: 25 mg via ORAL
  Filled 2023-04-02: qty 1

## 2023-04-02 MED ORDER — SODIUM CHLORIDE 3 % IN NEBU
4.0000 mL | INHALATION_SOLUTION | Freq: Two times a day (BID) | RESPIRATORY_TRACT | Status: DC
  Administered 2023-04-02 – 2023-04-04 (×4): 4 mL via RESPIRATORY_TRACT
  Filled 2023-04-02 (×6): qty 4

## 2023-04-02 MED ORDER — ACETYLCYSTEINE 20 % IN SOLN
2.0000 mL | Freq: Four times a day (QID) | RESPIRATORY_TRACT | Status: DC
  Filled 2023-04-02 (×4): qty 4

## 2023-04-02 MED ORDER — ACETYLCYSTEINE 20 % IN SOLN
2.0000 mL | Freq: Three times a day (TID) | RESPIRATORY_TRACT | Status: AC
  Administered 2023-04-02 – 2023-04-03 (×5): 2 mL via RESPIRATORY_TRACT
  Filled 2023-04-02 (×5): qty 4

## 2023-04-02 MED ORDER — METHYLPREDNISOLONE SODIUM SUCC 40 MG IJ SOLR
40.0000 mg | INTRAMUSCULAR | Status: DC
  Administered 2023-04-03: 40 mg via INTRAVENOUS
  Filled 2023-04-02: qty 1

## 2023-04-02 MED ORDER — METRONIDAZOLE 500 MG PO TABS
500.0000 mg | ORAL_TABLET | Freq: Two times a day (BID) | ORAL | Status: DC
  Administered 2023-04-02 – 2023-04-04 (×4): 500 mg via ORAL
  Filled 2023-04-02 (×4): qty 1

## 2023-04-02 NOTE — Telephone Encounter (Signed)
Patient has been in the hospital for lung issues. She usually sees Dr.Alava but in the hospital she saw [facility]. She says that gastroenterologist started to take over her care. They have released her but she is unsure if she should have left without getting an explanation from a pulmonologist. Please advise. Call back number is [phone]

## 2023-04-02 NOTE — Progress Notes (Signed)
NAME:  Kathleen [name], MRN:  [id number], DOB:  [birth year], LOS: 7 ADMISSION DATE:  03/26/2023, CONSULTATION DATE:  03/29/2023 REFERRING MD:  Dr. Marland Mcalpine - [facility], CHIEF COMPLAINT:  Acute respiratory distress    History of Present Illness:  Kathleen [name] is a 77 y.o. with a past medical history significant for Boop, OSA on BIPAP, COPD, chronic hypoxic respiratory failure on 5 L nasal cannula at baseline, spontaneous pneumothorax, asthma CHF, complete heart block now s/pacemaker, HFrEF, HTN, atrial fibrillation, and colovesicular fistula who presented to the ED 10/18 with complaint of nonproductive cough with wheezing and low-grade fever.  Pertinent  Medical History  Boop, OSA on BIPAP, COPD, chronic hypoxic respiratory failure on 5 L nasal cannula at baseline, spontaneous pneumothorax, asthma CHF, complete heart block now s/pacemaker, HFrEF, HTN, atrial fibrillation, and colovesicular fistula   Significant Hospital Events: Including procedures, antibiotic start and stop dates in addition to other pertinent events   10/18 presented with both GI and respiratory complaints with extensive pulmonary history 10/21 PCCM consulted  10/23 - Feels about the same. Cough remains biggest complaint 10/24 - 10/24 - still on 5L [place] says a year ago o2 need went up from 2 -> 4l [place] and for last few weeks on 5L [place].  She thinks she has pneumonia bu CT chest 03/30/23 shows emphsyeam with cor pulmonarle and basal atlectasis. C.o wworsening edema - known chronic s-CHF ef 40% , last echo April 2024. BNP  normal this admit but eedema is new  Interim History / Subjective:  .   10/25 - diarrrhea wit contrast. Got Lasix. Feels cough is still rough.  Feels mucus stuck. Same o2 needs - not worse. Able to go to toilet on own.  Had CTABP : lung clear other than old linteear scarring  Objective   Blood pressure 131/66, pulse 72, temperature 97.7 F (36.5 C), temperature source Oral, resp. rate 18, height 5' (1.524 m), weight  89 kg, SpO2 97%.        Intake/Output Summary (Last 24 hours) at 04/02/2023 0853 Last data filed at 04/01/2023 2041 Gross per 24 hour  Intake 730 ml  Output --  Net 730 ml    Filed Weights   03/30/23 0500 03/31/23 0500 04/01/23 0500  Weight: 90.9 kg 90 kg 89 kg    Examination: General: No distress. Just coughs but ambulated from BR -> bed well Neuro: Alert and Oriented x 3. GCS 15. Speech normal Psych: Pleasant Resp:  Barrel Chest - no.  Wheeze - no, Crackles - no BUT OVERALL COARSE (Very), No overt respiratory distress CVS: Normal heart sounds. Murmurs - no Ext: Stigmata of Connective Tissue Disease - no HEENT: Normal upper airway. PEERL +. No post nasal drip    Resolved Hospital Problem list     Assessment & Plan:  Acute on chronic hypoxic respiratory failure -Utilizes 4-5L [place] at baseline  Acute exacerbation COPD with overlapping tracheobronchomalacia  Former smoker  -Most recent exacerbation early September treated with Doxy and prednisone taper  OSA on BIPAP -Reports intermittent use of BIPAP   ddi use it last night Pulmonary nodule  -Stable 5 mm nodule in the right lower lobe seen on CT ABD/pelvis 03/24/23 HFrEF -Last weight at HF follow up 86.6kg, per note patient appears dry at this weight   04/01/23 - - no real change but lot of cough _   P: - Check ABG (last 2023 oct)  - Continue supplemental oxygen - on 5L home flow rate  -  Schedule bronchodilators- Would ideally change all inhaled therapies to nebs, but she has allergies to budesonide and arformoterol.  Joint pain was so severe she does not want to escalate therapy.  Currently on Breo and yupelri  - Solumedrol continue 40mg  BID -> 10/25 change to once a day -> 10/26: stat Po start PO  - Tussionex for course  - Incentive spirometry and flutter valve  - Will rechallgne hypertonic nebs to help with mucous clearance.   Abx and diuresis per primary  Other problems per primary - Acute  diverticulitis - for repeat CT 10/24 - Colovaginal fistula  Best Practice (right click and "Reselect all SmartList Selections" daily)  Per primary    SIGNATURE    Dr. Kalman Shan, M.D., F.C.C.P,  Pulmonary and Critical Care Medicine Staff Physician, [facility] Center Director - Interstitial Lung Disease  Program  Pulmonary Fibrosis [facility] at [facility] [place], [place], [address]   Pager: [phone], If no answer  -> Check AMION or Try [phone] Telephone (clinical office): [phone] Telephone (research): [phone]  8:53 AM 04/02/2023    LABS    PULMONARY No results for input(s): "PHART", "PCO2ART", "PO2ART", "HCO3", "TCO2", "O2SAT" in the last 168 hours.  Invalid input(s): "PCO2", "PO2"  CBC Recent Labs  Lab 03/31/23 0603 04/01/23 0526 04/02/23 0703  HGB 10.2* 10.2* 11.7*  HCT 33.6* 32.3* 37.9  WBC 16.4* 14.7* 15.3*  PLT 242 251 312    COAGULATION No results for input(s): "INR" in the last 168 hours.  CARDIAC  No results for input(s): "TROPONINI" in the last 168 hours. No results for input(s): "PROBNP" in the last 168 hours.   CHEMISTRY Recent Labs  Lab 03/28/23 0015 03/29/23 1013 03/30/23 0708 03/31/23 0603 04/01/23 0526 04/02/23 0703  NA 134* 139 139 137 136 137  K 4.0 4.1 4.6 4.6 4.6 3.7  CL 100 102 105 105 102 96*  CO2 23 22 24 25 25 28   GLUCOSE 198* 163* 173* 190* 188* 155*  BUN 25* 21 22 23  24* 31*  CREATININE 0.68 0.70 0.62 0.79 0.64 0.83  CALCIUM 9.0 10.3 9.8 9.7 9.4 9.8  MG 2.4 2.2 2.1 2.0 1.9  --   PHOS 2.0* 3.3 4.0 4.6 3.4  --    Estimated Creatinine Clearance: 57.3 mL/min (by C-G formula based on SCr of 0.83 mg/dL).   LIVER Recent Labs  Lab 03/28/23 0015 03/29/23 1013 03/30/23 0708 03/31/23 0603 04/01/23 0526  AST 16 17 18  14* 14*  ALT 11 13 14 13 13   ALKPHOS 48 46 39 36* 36*  BILITOT 0.5 0.6 0.4 0.6 0.4  PROT 6.0* 6.8 5.7* 5.5* 5.3*  ALBUMIN 3.2* 3.5 3.1* 2.9*  2.8*     INFECTIOUS Recent Labs  Lab 03/27/23 0835 03/27/23 1023 03/28/23 0614 03/28/23 1632 03/28/23 1826  LATICACIDVEN 3.7*   < > 3.7* 3.6* 2.2*  PROCALCITON 0.14  --   --   --   --    < > = values in this interval not displayed.     ENDOCRINE CBG (last 3)  Recent Labs    04/01/23 1623 04/01/23 2115 04/02/23 0729  GLUCAP 209* 174* 142*         IMAGING x48h  - image(s) personally visualized  -   highlighted in bold CT ABDOMEN PELVIS W CONTRAST  Result Date: 04/01/2023 CLINICAL DATA:  Abdominal pain with bloody diarrhea. Of colovaginal fistula. EXAM: CT ABDOMEN AND PELVIS WITH CONTRAST  TECHNIQUE: Multidetector CT imaging of the abdomen and pelvis was performed using the standard protocol following bolus administration of intravenous contrast. RADIATION DOSE REDUCTION: This exam was performed according to the departmental dose-optimization program which includes automated exposure control, adjustment of the mA and/or kV according to patient size and/or use of iterative reconstruction technique. CONTRAST:  OMNIPAQUE IOHEXOL 300 MG/ML  SOLN COMPARISON:  03/24/2023 FINDINGS: Lower chest: Heart is enlarged. Atelectasis noted at the lung bases. Hepatobiliary: Scattered tiny hypodensities in the liver parenchyma are too small to characterize but are statistically most likely benign. No followup imaging is recommended. Gallbladder is surgically absent. No intrahepatic or extrahepatic biliary dilation. Pancreas: No focal mass lesion. No dilatation of the main duct. No intraparenchymal cyst. No peripancreatic edema. Spleen: No splenomegaly. No suspicious focal mass lesion. Adrenals/Urinary Tract: No adrenal nodule or mass. Right kidney unremarkable. Stable exophytic cyst lower interpolar left kidney. No followup imaging is recommended. No evidence for hydroureter. The urinary bladder appears normal for the degree of distention. Stomach/Bowel: Stomach is unremarkable. No gastric wall  thickening. No evidence of outlet obstruction. Duodenum is normally positioned as is the ligament of Treitz. No small bowel wall thickening. No small bowel dilatation. Wall thickening and diverticulosis again noted in the sigmoid colon with pericolonic edema/inflammation consistent with diverticulitis. Interval decrease in fluid seen anterior to the distal sigmoid colon and in the cul-de-sac. There is loss of fat plane between the vaginal cuff and the distal sigmoid colon. Vaginal vault is filled with gas and high density material, features compatible with reported clinical history of colovaginal fistula. History Vascular/Lymphatic: There is mild atherosclerotic calcification of the abdominal aorta without aneurysm. There is no gastrohepatic or hepatoduodenal ligament lymphadenopathy. No retroperitoneal or mesenteric lymphadenopathy. No pelvic sidewall lymphadenopathy. Reproductive: Hysterectomy. Gas and high density material seen in the vagina as described above. There is no adnexal mass. Other: Interval decrease in free fluid seen previously. Musculoskeletal: No worrisome lytic or sclerotic osseous abnormality. IMPRESSION: 1. Interval decrease in free fluid seen previously anterior to the distal sigmoid colon and in the cul-de-sac. 2. Loss of fat plane between the vaginal cuff and the distal sigmoid colon. As before, vaginal vault is filled with gas and high density material, features compatible with reported clinical history of colovaginal fistula. 3.  Aortic Atherosclerosis (ICD10-I70.0). Electronically Signed   By: Kennith Center M.D.   On: 04/01/2023 15:00

## 2023-04-02 NOTE — Plan of Care (Signed)
  Problem: Education: Goal: Knowledge of General Education information will improve Description: Including pain rating scale, medication(s)/side effects and non-pharmacologic comfort measures Outcome: Progressing   Problem: Health Behavior/Discharge Planning: Goal: Ability to manage health-related needs will improve Outcome: Progressing   Problem: Clinical Measurements: Goal: Ability to maintain clinical measurements within normal limits will improve Outcome: Progressing Goal: Will remain free from infection Outcome: Progressing Goal: Diagnostic test results will improve Outcome: Progressing Goal: Respiratory complications will improve Outcome: Progressing   Problem: Activity: Goal: Risk for activity intolerance will decrease Outcome: Progressing   Problem: Nutrition: Goal: Adequate nutrition will be maintained Outcome: Progressing   Problem: Coping: Goal: Level of anxiety will decrease Outcome: Progressing   Problem: Elimination: Goal: Will not experience complications related to bowel motility Outcome: Progressing Goal: Will not experience complications related to urinary retention Outcome: Progressing   Problem: Pain Managment: Goal: General experience of comfort will improve Outcome: Progressing

## 2023-04-02 NOTE — Progress Notes (Signed)
Patient was stuck for ABG x 3 by 2 RT's and all attempts were unsuccessful. Patient does not want to be stuck again at this time. Dr. Marchelle Gearing was made aware via secure chat.

## 2023-04-02 NOTE — Plan of Care (Signed)
  Problem: Clinical Measurements: Goal: Ability to maintain clinical measurements within normal limits will improve Outcome: Progressing Goal: Diagnostic test results will improve Outcome: Progressing Goal: Respiratory complications will improve Outcome: Progressing   

## 2023-04-02 NOTE — Progress Notes (Signed)
PROGRESS NOTE    Kathleen [name]  ZHY:[id number] DOB: 1946-05-01 DOA: 03/26/2023 PCP: [name], MD   Brief Narrative:  This is a 77 year old female with past medical history of OSA (on BiPAP), BOOP (noncandidate for stenting), COPD, chronic respiratory failure 5L, HFrEF, A. fib (s/p AVN ablation, BiV, on anticoagulation), HTN, gout, and colovesical fistula. Patient presents with worsening respiratory status and temp at home as well as worsening abdominal pain.  She developed abdominal pain on left side but she does have acute diverticulitis noted on her most recent scan so we will added Flagyl.  She continues to have a significant cough and have added doxycycline.  We are ordering pulmonary toileting and lactic acid level was elevated but is trending down now.  Will continue antibiotic coverage currently and she is improving.  GI and pulm have consulted and is planning on repeating a CT scan of the abdomen pelvis on Thursday and pulmonary repeated a CT scan of the chest on 10/22.  Repeat CT abdomen pelvis planned 10/24.   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   Hypercoagulable state due to persistent atrial fibrillation (HCC)   Acute exacerbation of chronic obstructive pulmonary disease (COPD) (HCC)   Colovaginal fistula   Severe sepsis in the setting of Acute Diverticulitis with associated Colo vaginal fistula Lactic Acidosis  CT yesterday w/ interval improvement GI has signed off, advise completing a 14-day course of doxy/flagyl (ordered) and outpt f/u GI and gen surg (both scheduled)   Acute exacerbation of COPD wih Pneumonia   Acute on chronic chronic respiratory failure with hypoxia (HCC) -Repeat CT chest on the 22nd did not show any notable airspace disease despite requiring 5 L nasal cannula, which is her baseline. -Pulmonology following, appreciate insight recommendations -Continue supportive care, nebs, steroids, mucolytics incentive spirometry and flutter - pulm advises one more day IV steroids then transition to orals so can likely d/c tomorrow  Debility Resides in independent living at an assisted [facility] facility ([facility]). Voices concern about ability to function at home - pt consult   Chronic systolic heart failure (HCC) Not in acute exacerbation EF 35 to 40% per prior echo, BNP without elevation Edema stable in the setting of TED hose torsemide, metolazone on hold since admission  Restarted torsemide 10/24   Prolonged QT -Initially 539 appropriately downtrending to 480 with electrolyte correction  Hypercoagulable state due to persistent atrial fibrillation (HCC) -On Tikosyn and Xarelto, continued -Trend and replete electrolytes as appropriate   OSA, BOOP -Pulmonology following, continue BiPAP as ordered   CAD  HTN (hypertension) -Well-controlled, continue losartan, aspirin on hold  Lung Nodule -Stable at 5 mm and Needs to be followed in the outpatient setting   Normocytic Anemia Iron deficiency anemia Resume iron supplementation postinfection, follow labs every 48 hours   Hypoalbuminemia -In the setting of malnutrition and chronic disease/anemia   Obesity Complicates overall prognosis and care Body mass index is 38.32 kg/m.   DVT prophylaxis: Place TED hose Start: 03/31/23 0952 rivaroxaban (XARELTO) tablet 20 mg   Code Status:   Code Status: Prior  Family Communication: None present  Status is: Inpatient  Dispo:  The patient is from: Home              Anticipated d/c is to: home              Anticipated d/c date is: tomorrow  Consultants:  Pulmonology,  GI  Procedures:  Pending above  Antimicrobials:  Ceftriaxone, doxycycline, Flagyl  Subjective: No acute issues or events overnight. Tolerating diet. Reports breathing a little worse today, no fever, some cough.   Objective: Vitals:   04/02/23 0733 04/02/23 1125 04/02/23 1309 04/02/23 1323  BP:    (!) 135/97  Pulse:    81  Resp:      Temp:    97.6 F (36.4 C)  TempSrc:    Oral  SpO2: 97% 94% 96% 100%  Weight:      Height:        Intake/Output Summary (Last 24 hours) at 04/02/2023 1422 Last data filed at 04/02/2023 0900 Gross per 24 hour  Intake 650 ml  Output --  Net 650 ml   Filed Weights   03/30/23 0500 03/31/23 0500 04/01/23 0500  Weight: 90.9 kg 90 kg 89 kg    Examination:  General: Resting somewhat comfortably no acute distress HEENT:  Normocephalic atraumatic.    Lungs: Diminished bilaterally. No wheeze, few scattered rhonchi Heart:  Regular rate and rhythm.    Abdomen:  Soft, without rebound, obese Extremities: edema to calves  Data Reviewed: I have personally reviewed following labs and imaging studies  CBC: Recent Labs  Lab 03/27/23 1418 03/28/23 0015 03/28/23 2117 03/29/23 1013 03/29/23 1459 03/29/23 2131 03/30/23 0708 03/31/23 0603 04/01/23 0526 04/02/23 0703  WBC 16.0* 17.4*  --  14.6*  --   --  12.3* 16.4* 14.7* 15.3*  NEUTROABS 15.0* 16.1*  --  12.3*  --   --  10.1* 14.3*  --   --   HGB 10.1* 9.9*   < > 11.0*   < > 11.1* 10.3* 10.2* 10.2* 11.7*  HCT 32.3* 32.1*   < > 36.1   < > 35.3* 33.6* 33.6* 32.3* 37.9  MCV 85.7 87.0  --  88.5  --   --  87.0 88.4 86.8 85.7  PLT 210 217  --  284  --   --  240 242 251 312   < > = values in this interval not displayed.   Basic Metabolic Panel: Recent Labs  Lab 03/28/23 0015 03/29/23 1013 03/30/23 0708 03/31/23 0603 04/01/23 0526 04/02/23 0703  NA 134* 139 139 137 136 137  K 4.0 4.1 4.6 4.6 4.6 3.7  CL 100 102 105 105 102 96*  CO2 23 22 24 25 25 28   GLUCOSE 198* 163* 173* 190* 188* 155*   BUN 25* 21 22 23  24* 31*  CREATININE 0.68 0.70 0.62 0.79 0.64 0.83  CALCIUM 9.0 10.3 9.8 9.7 9.4 9.8  MG 2.4 2.2 2.1 2.0 1.9  --   PHOS 2.0* 3.3 4.0 4.6 3.4  --    GFR: Estimated Creatinine Clearance: 57.3 mL/min (by C-G formula based on SCr of 0.83 mg/dL). Liver Function Tests: Recent Labs  Lab 03/28/23 0015 03/29/23 1013 03/30/23 0708 03/31/23 0603 04/01/23 0526  AST 16 17 18  14* 14*  ALT 11 13 14 13 13   ALKPHOS 48 46 39 36* 36*  BILITOT 0.5 0.6 0.4 0.6 0.4  PROT 6.0* 6.8 5.7* 5.5* 5.3*  ALBUMIN 3.2* 3.5 3.1* 2.9* 2.8*   No results for input(s): "LIPASE", "AMYLASE" in the last 168 hours. No results  for input(s): "AMMONIA" in the last 168 hours. Coagulation Profile: No results for input(s): "INR", "PROTIME" in the last 168 hours. Cardiac Enzymes: No results for input(s): "CKTOTAL", "CKMB", "CKMBINDEX", "TROPONINI" in the last 168 hours. BNP (last 3 results) No results for input(s): "PROBNP" in the last 8760 hours. HbA1C: No results for input(s): "HGBA1C" in the last 72 hours. CBG: Recent Labs  Lab 04/01/23 1143 04/01/23 1623 04/01/23 2115 04/02/23 0729 04/02/23 1127  GLUCAP 124* 209* 174* 142* 146*   Lipid Profile: No results for input(s): "CHOL", "HDL", "LDLCALC", "TRIG", "CHOLHDL", "LDLDIRECT" in the last 72 hours. Thyroid Function Tests: No results for input(s): "TSH", "T4TOTAL", "FREET4", "T3FREE", "THYROIDAB" in the last 72 hours. Anemia Panel: No results for input(s): "VITAMINB12", "FOLATE", "FERRITIN", "TIBC", "IRON", "RETICCTPCT" in the last 72 hours. Sepsis Labs: Recent Labs  Lab 03/27/23 0835 03/27/23 1023 03/28/23 0015 03/28/23 0614 03/28/23 1632 03/28/23 1826  PROCALCITON 0.14  --   --   --   --   --   LATICACIDVEN 3.7*   < > 2.9* 3.7* 3.6* 2.2*   < > = values in this interval not displayed.    Recent Results (from the past 240 hour(s))  Resp panel by RT-PCR (RSV, Flu A&B, Covid) Anterior Nasal Swab     Status: None   Collection Time:  03/26/23  8:16 PM   Specimen: Anterior Nasal Swab  Result Value Ref Range Status   SARS Coronavirus 2 by RT PCR NEGATIVE NEGATIVE Final    Comment: (NOTE) SARS-CoV-2 target nucleic acids are NOT DETECTED.  The SARS-CoV-2 RNA is generally detectable in upper respiratory specimens during the acute phase of infection. The lowest concentration of SARS-CoV-2 viral copies this assay can detect is 138 copies/mL. A negative result does not preclude SARS-Cov-2 infection and should not be used as the sole basis for treatment or other patient management decisions. A negative result may occur with  improper specimen collection/handling, submission of specimen other than nasopharyngeal swab, presence of viral mutation(s) within the areas targeted by this assay, and inadequate number of viral copies(<138 copies/mL). A negative result must be combined with clinical observations, patient history, and epidemiological information. The expected result is Negative.  Fact Sheet for Patients:  BloggerCourse.com  Fact Sheet for Healthcare Providers:  SeriousBroker.it  This test is no t yet approved or cleared by the [place] FDA and  has been authorized for detection and/or diagnosis of SARS-CoV-2 by FDA under an Emergency Use Authorization (EUA). This EUA will remain  in effect (meaning this test can be used) for the duration of the COVID-19 declaration under Section 564(b)(1) of the Act, 21 U.S.C.section 360bbb-3(b)(1), unless the authorization is terminated  or revoked sooner.       Influenza A by PCR NEGATIVE NEGATIVE Final   Influenza B by PCR NEGATIVE NEGATIVE Final    Comment: (NOTE) The Xpert Xpress SARS-CoV-2/FLU/RSV plus assay is intended as an aid in the diagnosis of influenza from Nasopharyngeal swab specimens and should not be used as a sole basis for treatment. Nasal washings and aspirates are unacceptable for Xpert Xpress  SARS-CoV-2/FLU/RSV testing.  Fact Sheet for Patients: BloggerCourse.com  Fact Sheet for Healthcare Providers: SeriousBroker.it  This test is not yet approved or cleared by the [place] FDA and has been authorized for detection and/or diagnosis of SARS-CoV-2 by FDA under an Emergency Use Authorization (EUA). This EUA will remain in effect (meaning this test can be used) for the duration of the COVID-19 declaration under Section 564(b)(1) of  the Act, 21 U.S.C. section 360bbb-3(b)(1), unless the authorization is terminated or revoked.     Resp Syncytial Virus by PCR NEGATIVE NEGATIVE Final    Comment: (NOTE) Fact Sheet for Patients: BloggerCourse.com  Fact Sheet for Healthcare Providers: SeriousBroker.it  This test is not yet approved or cleared by the [place] FDA and has been authorized for detection and/or diagnosis of SARS-CoV-2 by FDA under an Emergency Use Authorization (EUA). This EUA will remain in effect (meaning this test can be used) for the duration of the COVID-19 declaration under Section 564(b)(1) of the Act, 21 U.S.C. section 360bbb-3(b)(1), unless the authorization is terminated or revoked.  Performed at [facility], [address] [place], [place] [address]   Blood culture (routine x 2)     Status: None   Collection Time: 03/26/23  8:20 PM   Specimen: BLOOD  Result Value Ref Range Status   Specimen Description   Final    BLOOD LEFT ANTECUBITAL Performed at [facility], [address], [place], [place] [address]    Special Requests   Final    BOTTLES DRAWN AEROBIC AND ANAEROBIC Blood Culture results may not be optimal due to an excessive volume of blood received in culture bottles Performed at [facility], [address], [place], [place] [address]    Culture   Final    NO GROWTH 5  DAYS Performed at [facility], [address], [place], [place] [address]    Report Status 04/01/2023 FINAL  Final  Blood culture (routine x 2)     Status: None   Collection Time: 03/26/23  8:30 PM   Specimen: BLOOD  Result Value Ref Range Status   Specimen Description   Final    BLOOD RIGHT ANTECUBITAL Performed at [facility], [address] [place], [place] [address]    Special Requests   Final    BOTTLES DRAWN AEROBIC AND ANAEROBIC Blood Culture adequate volume Performed at [facility], [address], [place], [place] [address]    Culture   Final    NO GROWTH 5 DAYS Performed at [facility], [address], [place], [place] [address]    Report Status 04/01/2023 FINAL  Final  Respiratory (~20 pathogens) panel by PCR     Status: None   Collection Time: 03/27/23  1:02 PM   Specimen: Nasopharyngeal Swab; Respiratory  Result Value Ref Range Status   Adenovirus NOT DETECTED NOT DETECTED Final   Coronavirus 229E NOT DETECTED NOT DETECTED Final    Comment: (NOTE) The Coronavirus on the Respiratory Panel, DOES NOT test for the novel  Coronavirus (2019 nCoV)    Coronavirus HKU1 NOT DETECTED NOT DETECTED Final   Coronavirus NL63 NOT DETECTED NOT DETECTED Final   Coronavirus OC43 NOT DETECTED NOT DETECTED Final   Metapneumovirus NOT DETECTED NOT DETECTED Final   Rhinovirus / Enterovirus NOT DETECTED NOT DETECTED Final   Influenza A NOT DETECTED NOT DETECTED Final   Influenza B NOT DETECTED NOT DETECTED Final   Parainfluenza Virus 1 NOT DETECTED NOT DETECTED Final   Parainfluenza Virus 2 NOT DETECTED NOT DETECTED Final   Parainfluenza Virus 3 NOT DETECTED NOT DETECTED Final   Parainfluenza Virus 4 NOT DETECTED NOT DETECTED Final   Respiratory Syncytial Virus NOT DETECTED NOT DETECTED Final   Bordetella pertussis NOT DETECTED NOT DETECTED Final   Bordetella Parapertussis NOT DETECTED NOT DETECTED Final   Chlamydophila  pneumoniae NOT DETECTED NOT DETECTED Final  Mycoplasma pneumoniae NOT DETECTED NOT DETECTED Final    Comment: Performed at [facility], [address] [place], [place] [address]         Radiology Studies: CT ABDOMEN PELVIS W CONTRAST  Result Date: 04/01/2023 CLINICAL DATA:  Abdominal pain with bloody diarrhea. Of colovaginal fistula. EXAM: CT ABDOMEN AND PELVIS WITH CONTRAST TECHNIQUE: Multidetector CT imaging of the abdomen and pelvis was performed using the standard protocol following bolus administration of intravenous contrast. RADIATION DOSE REDUCTION: This exam was performed according to the departmental dose-optimization program which includes automated exposure control, adjustment of the mA and/or kV according to patient size and/or use of iterative reconstruction technique. CONTRAST:  OMNIPAQUE IOHEXOL 300 MG/ML  SOLN COMPARISON:  03/24/2023 FINDINGS: Lower chest: Heart is enlarged. Atelectasis noted at the lung bases. Hepatobiliary: Scattered tiny hypodensities in the liver parenchyma are too small to characterize but are statistically most likely benign. No followup imaging is recommended. Gallbladder is surgically absent. No intrahepatic or extrahepatic biliary dilation. Pancreas: No focal mass lesion. No dilatation of the main duct. No intraparenchymal cyst. No peripancreatic edema. Spleen: No splenomegaly. No suspicious focal mass lesion. Adrenals/Urinary Tract: No adrenal nodule or mass. Right kidney unremarkable. Stable exophytic cyst lower interpolar left kidney. No followup imaging is recommended. No evidence for hydroureter. The urinary bladder appears normal for the degree of distention. Stomach/Bowel: Stomach is unremarkable. No gastric wall thickening. No evidence of outlet obstruction. Duodenum is normally positioned as is the ligament of Treitz. No small bowel wall thickening. No small bowel dilatation. Wall thickening and diverticulosis again noted in the sigmoid  colon with pericolonic edema/inflammation consistent with diverticulitis. Interval decrease in fluid seen anterior to the distal sigmoid colon and in the cul-de-sac. There is loss of fat plane between the vaginal cuff and the distal sigmoid colon. Vaginal vault is filled with gas and high density material, features compatible with reported clinical history of colovaginal fistula. History Vascular/Lymphatic: There is mild atherosclerotic calcification of the abdominal aorta without aneurysm. There is no gastrohepatic or hepatoduodenal ligament lymphadenopathy. No retroperitoneal or mesenteric lymphadenopathy. No pelvic sidewall lymphadenopathy. Reproductive: Hysterectomy. Gas and high density material seen in the vagina as described above. There is no adnexal mass. Other: Interval decrease in free fluid seen previously. Musculoskeletal: No worrisome lytic or sclerotic osseous abnormality. IMPRESSION: 1. Interval decrease in free fluid seen previously anterior to the distal sigmoid colon and in the cul-de-sac. 2. Loss of fat plane between the vaginal cuff and the distal sigmoid colon. As before, vaginal vault is filled with gas and high density material, features compatible with reported clinical history of colovaginal fistula. 3.  Aortic Atherosclerosis (ICD10-I70.0). Electronically Signed   By: Kennith Center M.D.   On: 04/01/2023 15:00        Scheduled Meds:  acetylcysteine  2 mL Nebulization TID   dextromethorphan-guaiFENesin  1 tablet Oral BID   dofetilide  250 mcg Oral BID   doxycycline  100 mg Oral Q12H   fluticasone furoate-vilanterol  1 puff Inhalation Daily   gabapentin  200 mg Oral QHS   insulin aspart  0-15 Units Subcutaneous TID WC   insulin aspart  0-5 Units Subcutaneous QHS   levalbuterol  0.63 mg Nebulization TID   losartan  12.5 mg Oral Daily   [START ON 04/03/2023] methylPREDNISolone (SOLU-MEDROL) injection  40 mg Intravenous Q24H   pantoprazole  40 mg Oral Daily   potassium  chloride SA  40 mEq Oral Daily   revefenacin  175  mcg Nebulization Daily   rivaroxaban  20 mg Oral Q supper   sodium chloride HYPERTONIC  4 mL Nebulization BID   torsemide  60 mg Oral Daily   Continuous Infusions:  cefTRIAXone (ROCEPHIN)  IV 2 g (04/02/23 1221)   metronidazole 500 mg (04/02/23 1058)     LOS: 7 days    Silvano Bilis, MD [facility]  If 7PM-7AM, please contact night-coverage [email or web address]  04/02/2023, 2:22 PM

## 2023-04-02 NOTE — Progress Notes (Signed)
Mobility Specialist - Progress Note   04/02/23 1125  Oxygen Therapy  SpO2 94 %  O2 Device Nasal Cannula  O2 Flow Rate (L/min) 4 L/min  Patient Activity (if Appropriate) Ambulating  Mobility  Activity Ambulated with assistance in hallway  Level of Assistance Modified independent, requires aide device or extra time  Assistive Device Front wheel walker  Distance Ambulated (ft) 180 ft  Activity Response Tolerated well  Mobility Referral Yes  $Mobility charge 1 Mobility  Mobility Specialist Start Time (ACUTE ONLY) 1111  Mobility Specialist Stop Time (ACUTE ONLY) 1123  Mobility Specialist Time Calculation (min) (ACUTE ONLY) 12 min   Pt received in bed and agreeable to mobility. No complaints during session.  Pt to EOB after session with all needs met. NT in room.    Pre-mobility: 97%SpO2 (4L [place])  During mobility: 94% SpO2 (4L [place]) Post-mobility: 95% SPO2 (4L [place])  Chief Technology Officer

## 2023-04-03 DIAGNOSIS — J441 Chronic obstructive pulmonary disease with (acute) exacerbation: Secondary | ICD-10-CM | POA: Diagnosis not present

## 2023-04-03 DIAGNOSIS — I4819 Other persistent atrial fibrillation: Secondary | ICD-10-CM | POA: Diagnosis not present

## 2023-04-03 DIAGNOSIS — J9611 Chronic respiratory failure with hypoxia: Secondary | ICD-10-CM

## 2023-04-03 DIAGNOSIS — E785 Hyperlipidemia, unspecified: Secondary | ICD-10-CM | POA: Diagnosis not present

## 2023-04-03 DIAGNOSIS — D6869 Other thrombophilia: Secondary | ICD-10-CM | POA: Diagnosis not present

## 2023-04-03 LAB — GLUCOSE, CAPILLARY
Glucose-Capillary: 103 mg/dL — ABNORMAL HIGH (ref 70–99)
Glucose-Capillary: 132 mg/dL — ABNORMAL HIGH (ref 70–99)
Glucose-Capillary: 237 mg/dL — ABNORMAL HIGH (ref 70–99)
Glucose-Capillary: 126 mg/dL — ABNORMAL HIGH (ref 70–99)

## 2023-04-03 LAB — PHOSPHORUS: Phosphorus: 3.2 mg/dL (ref 2.5–4.6)

## 2023-04-03 LAB — MAGNESIUM: Magnesium: 2.1 mg/dL (ref 1.7–2.4)

## 2023-04-03 MED ORDER — NYSTATIN 100000 UNIT/ML MT SUSP
5.0000 mL | Freq: Four times a day (QID) | OROMUCOSAL | Status: DC
  Administered 2023-04-03 – 2023-04-04 (×5): 500000 [IU] via ORAL
  Filled 2023-04-03 (×5): qty 5

## 2023-04-03 NOTE — Plan of Care (Signed)

## 2023-04-03 NOTE — Progress Notes (Signed)
   04/03/23 2230  BiPAP/CPAP/SIPAP  BiPAP/CPAP/SIPAP Pt Type Adult  BiPAP/CPAP/SIPAP DREAMSTATIOND  Mask Type Full face mask (Pts. home mask)  Mask Size Medium  Respiratory Rate 18 breaths/min  IPAP 12 cmH20  EPAP 8 cmH2O  Flow Rate 5 lpm  Patient Home Equipment Yes (Full Face Mask)  Auto Titrate No  CPAP/SIPAP surface wiped down Yes   RN assisted pt. with placement, Oxygen line connected to BiPAP circuit, pt. tolerating well, made aware to notify if needed.

## 2023-04-03 NOTE — Progress Notes (Signed)
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  She was started on antibiotics, steroid, breathing treatments.  GI and pulmonology consulted.  Repeat CT abdomen and pelvis on 10/24 with interval decrease in previously visualized free fluid anterior to the distal sigmoid colon and in the cul-de-sac and colovaginal fistula.  GI recommended completing 14 days of antibiotics and outpatient follow-up.  She also has upcoming appointment with colorectal surgery.   Subjective: Seen and examined earlier this morning.  No major events overnight of this morning.  She reports improvement in her breathing and cough.  Denies chest pain.   Objective: Vitals:   04/03/23 0555 04/03/23 0903 04/03/23 0948 04/03/23 1434  BP: 122/62   (!) 111/58  Pulse:    80  Resp:  17  (!) 23  Temp: 97.9 F (36.6 C)   98.4 F (36.9 C)  TempSrc: Oral   Oral  SpO2: 97% 97% 98% 97%  Weight: 86.4 kg     Height:        Examination:  GENERAL: No apparent distress.  Nontoxic. HEENT: MMM.  Vision and hearing grossly intact.  NECK: Supple.  No apparent JVD.  RESP:  No IWOB.  Fair aeration bilaterally. CVS:  RRR. Heart sounds normal.  ABD/GI/GU: BS+. Abd soft, NTND.  MSK/EXT:  Moves extremities. No apparent deformity.  Trace BLE edema. SKIN: no apparent skin lesion or wound NEURO: Awake, alert and oriented appropriately.  No apparent focal neuro deficit. PSYCH: Calm. Normal  affect.   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   -Repeat CT abdomen and pelvis on 10/24 with interval improvement.   -GI recommended completing 14 days of Doxy and Flagyl and outpatient follow-up -She has upcoming appointment with colorectal surgery as well.   Acute on chronic respiratory failure with hypoxia due to COPD exacerbation and pneumonia: Repeat CT chest on 10/22 did not show notable airspace disease -Appreciate input by PCCM -Continue supportive care, nebs, steroids, mucolytics incentive spirometry and flutter   Physical debility: Resides in ILF.  Voices concern about ability to function at home -PT/OT eval   Chronic systolic heart failure: Appears euvolemic except for trace BLE edema.  TTE with LVEF of 35 to 40%.  No BNP elevation. -Continue home torsemide -Metolazone as needed  Prolonged QT: Initially 539 but improved to 480. -Telemetry monitoring -Avoid QT prolonging drugs -Optimize electrolytes   Persistent atrial fibrillation: Rate controlled. Hypercoagulable state: On Xarelto. -On Tikosyn and Xarelto, continued -Trend and replete electrolytes as appropriate   OSA, BOOP -Pulmonology following, continue  BiPAP as ordered   CAD/HTN: Stable. -continue losartan, aspirin on hold   Lung Nodule: 5 mm lung nodule noted on CT -Needs to be followed in the outpatient setting   Iron deficiency anemia: Stable -Continue home meds   Hypoalbuminemia -In the setting of malnutrition and chronic disease/anemia   Morbid obesity: Elevated BMI with comorbidity as above. Body mass index is 37.2 kg/m. Nutrition Problem: Increased nutrient needs Etiology: chronic illness Signs/Symptoms: estimated needs Interventions: Snacks   DVT prophylaxis:  Place TED hose Start: 03/31/23 0952 rivaroxaban (XARELTO) tablet 20 mg  Code Status: Full code.  Confirmed with patient. Family Communication: None at bedside Level of care: Telemetry Status is: Inpatient Remains inpatient appropriate because: Acute on chronic respiratory failure/COPD exacerbation/diverticulitis   Final disposition: ILF/ALF Consultants:  Pulmonology Gastroenterology  55 minutes with more than 50% spent in reviewing records, counseling patient/family and coordinating care.   Sch Meds:  Scheduled Meds:  acetylcysteine  2 mL Nebulization TID   dextromethorphan-guaiFENesin  1 tablet Oral BID   dofetilide  250 mcg Oral BID   doxycycline  100 mg Oral Q12H   fluticasone furoate-vilanterol  1 puff Inhalation Daily   gabapentin  200 mg Oral QHS   insulin aspart  0-15 Units Subcutaneous TID WC   insulin aspart  0-5 Units Subcutaneous QHS   levalbuterol  0.63 mg Nebulization TID   losartan  12.5 mg Oral Daily   methylPREDNISolone (SOLU-MEDROL) injection  40 mg Intravenous Q24H   metroNIDAZOLE  500 mg Oral Q12H   nystatin  5 mL Oral QID   pantoprazole  40 mg Oral Daily   potassium chloride SA  40 mEq Oral Daily   revefenacin  175 mcg Nebulization Daily   rivaroxaban  20 mg Oral Q supper   sodium chloride HYPERTONIC  4 mL Nebulization BID   torsemide  60 mg Oral Daily   Continuous Infusions: PRN Meds:.albuterol,  chlorpheniramine-HYDROcodone, phenol, prochlorperazine, traZODone, witch hazel-glycerin  Antimicrobials: Anti-infectives (From admission, onward)    Start     Dose/Rate Route Frequency Ordered Stop   04/02/23 2200  metroNIDAZOLE (FLAGYL) tablet 500 mg        500 mg Oral Every 12 hours 04/02/23 1433     03/28/23 1515  doxycycline (VIBRA-TABS) tablet 100 mg        100 mg Oral Every 12 hours 03/28/23 1501     03/28/23 1000  metroNIDAZOLE (FLAGYL) IVPB 500 mg  Status:  Discontinued        500 mg 100 mL/hr over 60 Minutes Intravenous Every 12 hours 03/28/23 0911 04/02/23 1433   03/28/23 1000  cefTRIAXone (ROCEPHIN) 2 g in sodium chloride 0.9 % 100 mL IVPB  Status:  Discontinued        2 g 200 mL/hr over 30 Minutes Intravenous Every 24 hours 03/28/23 0942 04/02/23 1433   03/28/23 0100  azithromycin (ZITHROMAX) tablet 500 mg  Status:  Discontinued       Placed in "Followed by" Linked Group   500 mg Oral Daily 03/27/23 0007 03/27/23 0059   03/27/23 1300  doxycycline (VIBRAMYCIN) 100 mg in sodium chloride 0.9 % 250 mL IVPB  Status:  Discontinued        100 mg 125 mL/hr  over 120 Minutes Intravenous Every 12 hours 03/27/23 0100 03/28/23 1501   03/27/23 1000  cefTRIAXone (ROCEPHIN) 1 g in sodium chloride 0.9 % 100 mL IVPB  Status:  Discontinued        1 g 200 mL/hr over 30 Minutes Intravenous Every 24 hours 03/27/23 0059 03/28/23 0942   03/27/23 0100  azithromycin (ZITHROMAX) 500 mg in sodium chloride 0.9 % 250 mL IVPB  Status:  Discontinued       Placed in "Followed by" Linked Group   500 mg 250 mL/hr over 60 Minutes Intravenous Every 24 hours 03/27/23 0007 03/27/23 0059   03/26/23 2245  doxycycline (VIBRAMYCIN) 100 mg in sodium chloride 0.9 % 250 mL IVPB        100 mg 125 mL/hr over 120 Minutes Intravenous  Once 03/26/23 2231 03/27/23 0236   03/26/23 2115  cefTRIAXone (ROCEPHIN) 1 g in sodium chloride 0.9 % 100 mL IVPB        1 g 200 mL/hr over 30 Minutes Intravenous  Once 03/26/23 2113  03/26/23 2211   03/26/23 2115  azithromycin (ZITHROMAX) 500 mg in sodium chloride 0.9 % 250 mL IVPB  Status:  Discontinued        500 mg 250 mL/hr over 60 Minutes Intravenous  Once 03/26/23 2113 03/26/23 2231        I have personally reviewed the following labs and images: CBC: Recent Labs  Lab 03/28/23 0015 03/28/23 2117 03/29/23 1013 03/29/23 1459 03/29/23 2131 03/30/23 0708 03/31/23 0603 04/01/23 0526 04/02/23 0703  WBC 17.4*  --  14.6*  --   --  12.3* 16.4* 14.7* 15.3*  NEUTROABS 16.1*  --  12.3*  --   --  10.1* 14.3*  --   --   HGB 9.9*   < > 11.0*   < > 11.1* 10.3* 10.2* 10.2* 11.7*  HCT 32.1*   < > 36.1   < > 35.3* 33.6* 33.6* 32.3* 37.9  MCV 87.0  --  88.5  --   --  87.0 88.4 86.8 85.7  PLT 217  --  284  --   --  240 242 251 312   < > = values in this interval not displayed.   BMP &GFR Recent Labs  Lab 03/29/23 1013 03/30/23 0708 03/31/23 0603 04/01/23 0526 04/02/23 0703 04/03/23 0650  NA 139 139 137 136 137  --   K 4.1 4.6 4.6 4.6 3.7  --   CL 102 105 105 102 96*  --   CO2 22 24 25 25 28   --   GLUCOSE 163* 173* 190* 188* 155*  --   BUN 21 22 23  24* 31*  --   CREATININE 0.70 0.62 0.79 0.64 0.83  --   CALCIUM 10.3 9.8 9.7 9.4 9.8  --   MG 2.2 2.1 2.0 1.9  --  2.1  PHOS 3.3 4.0 4.6 3.4  --  3.2   Estimated Creatinine Clearance: 56.3 mL/min (by C-G formula based on SCr of 0.83 mg/dL). Liver & Pancreas: Recent Labs  Lab 03/28/23 0015 03/29/23 1013 03/30/23 0708 03/31/23 0603 04/01/23 0526  AST 16 17 18  14* 14*  ALT 11 13 14 13 13   ALKPHOS 48 46 39 36* 36*  BILITOT 0.5 0.6 0.4 0.6 0.4  PROT 6.0* 6.8 5.7* 5.5* 5.3*  ALBUMIN 3.2* 3.5 3.1* 2.9* 2.8*   No results for input(s): "LIPASE", "AMYLASE" in the last 168 hours. No results for input(s): "AMMONIA" in the last 168 hours. Diabetic: No results  for input(s): "HGBA1C" in the last 72 hours. Recent Labs  Lab 04/02/23 1619 04/02/23 2148 04/03/23 0737 04/03/23 1212 04/03/23 1624  GLUCAP 213*  144* 103* 132* 237*   Cardiac Enzymes: No results for input(s): "CKTOTAL", "CKMB", "CKMBINDEX", "TROPONINI" in the last 168 hours. No results for input(s): "PROBNP" in the last 8760 hours. Coagulation Profile: No results for input(s): "INR", "PROTIME" in the last 168 hours. Thyroid Function Tests: No results for input(s): "TSH", "T4TOTAL", "FREET4", "T3FREE", "THYROIDAB" in the last 72 hours. Lipid Profile: No results for input(s): "CHOL", "HDL", "LDLCALC", "TRIG", "CHOLHDL", "LDLDIRECT" in the last 72 hours. Anemia Panel: No results for input(s): "VITAMINB12", "FOLATE", "FERRITIN", "TIBC", "IRON", "RETICCTPCT" in the last 72 hours. Urine analysis:    Component Value Date/Time   COLORURINE YELLOW 03/27/2023 0434   APPEARANCEUR CLOUDY (A) 03/27/2023 0434   APPEARANCEUR Hazy 12/25/2012 1653   LABSPEC 1.005 03/27/2023 0434   LABSPEC 1.021 12/25/2012 1653   PHURINE 5.0 03/27/2023 0434   GLUCOSEU NEGATIVE 03/27/2023 0434   GLUCOSEU NEGATIVE 03/17/2023 1003   HGBUR SMALL (A) 03/27/2023 0434   BILIRUBINUR NEGATIVE 03/27/2023 0434   BILIRUBINUR Negative 12/25/2012 1653   KETONESUR NEGATIVE 03/27/2023 0434   PROTEINUR NEGATIVE 03/27/2023 0434   UROBILINOGEN 0.2 03/17/2023 1003   NITRITE NEGATIVE 03/27/2023 0434   LEUKOCYTESUR LARGE (A) 03/27/2023 0434   LEUKOCYTESUR Negative 12/25/2012 1653   Sepsis Labs: Invalid input(s): "PROCALCITONIN", "LACTICIDVEN"  Microbiology: Recent Results (from the past 240 hour(s))  Resp panel by RT-PCR (RSV, Flu A&B, Covid) Anterior Nasal Swab     Status: None   Collection Time: 03/26/23  8:16 PM   Specimen: Anterior Nasal Swab  Result Value Ref Range Status   SARS Coronavirus 2 by RT PCR NEGATIVE NEGATIVE Final    Comment: (NOTE) SARS-CoV-2 target nucleic acids are NOT DETECTED.  The SARS-CoV-2 RNA is generally detectable in upper respiratory specimens during the acute phase of infection. The lowest concentration of SARS-CoV-2 viral copies this  assay can detect is 138 copies/mL. A negative result does not preclude SARS-Cov-2 infection and should not be used as the sole basis for treatment or other patient management decisions. A negative result may occur with  improper specimen collection/handling, submission of specimen other than nasopharyngeal swab, presence of viral mutation(s) within the areas targeted by this assay, and inadequate number of viral copies(<138 copies/mL). A negative result must be combined with clinical observations, patient history, and epidemiological information. The expected result is Negative.  Fact Sheet for Patients:  BloggerCourse.com  Fact Sheet for Healthcare Providers:  SeriousBroker.it  This test is no t yet approved or cleared by the [place] FDA and  has been authorized for detection and/or diagnosis of SARS-CoV-2 by FDA under an Emergency Use Authorization (EUA). This EUA will remain  in effect (meaning this test can be used) for the duration of the COVID-19 declaration under Section 564(b)(1) of the Act, 21 U.S.C.section 360bbb-3(b)(1), unless the authorization is terminated  or revoked sooner.       Influenza A by PCR NEGATIVE NEGATIVE Final   Influenza B by PCR NEGATIVE NEGATIVE Final    Comment: (NOTE) The Xpert Xpress SARS-CoV-2/FLU/RSV plus assay is intended as an aid in the diagnosis of influenza from Nasopharyngeal swab specimens and should not be used as a sole basis for treatment. Nasal washings and aspirates are unacceptable for Xpert Xpress SARS-CoV-2/FLU/RSV testing.  Fact Sheet for Patients: BloggerCourse.com  Fact Sheet for Healthcare Providers: SeriousBroker.it  This test is not yet approved or cleared  by the [place] and has been authorized for detection and/or diagnosis of SARS-CoV-2 by FDA under an Emergency Use Authorization (EUA). This EUA will  remain in effect (meaning this test can be used) for the duration of the COVID-19 declaration under Section 564(b)(1) of the Act, 21 U.S.C. section 360bbb-3(b)(1), unless the authorization is terminated or revoked.     Resp Syncytial Virus by PCR NEGATIVE NEGATIVE Final    Comment: (NOTE) Fact Sheet for Patients: BloggerCourse.com  Fact Sheet for Healthcare Providers: SeriousBroker.it  This test is not yet approved or cleared by the [place] FDA and has been authorized for detection and/or diagnosis of SARS-CoV-2 by FDA under an Emergency Use Authorization (EUA). This EUA will remain in effect (meaning this test can be used) for the duration of the COVID-19 declaration under Section 564(b)(1) of the Act, 21 U.S.C. section 360bbb-3(b)(1), unless the authorization is terminated or revoked.  Performed at [facility], [address], [place], [place] [address]   Blood culture (routine x 2)     Status: None   Collection Time: 03/26/23  8:20 PM   Specimen: BLOOD  Result Value Ref Range Status   Specimen Description   Final    BLOOD LEFT ANTECUBITAL Performed at [facility], [address] [place], [place] [address]    Special Requests   Final    BOTTLES DRAWN AEROBIC AND ANAEROBIC Blood Culture results may not be optimal due to an excessive volume of blood received in culture bottles Performed at [facility], [address] [place], [place] [address]    Culture   Final    NO GROWTH 5 DAYS Performed at [facility], [address] [place], [place] [address]    Report Status 04/01/2023 FINAL  Final  Blood culture (routine x 2)     Status: None   Collection Time: 03/26/23  8:30 PM   Specimen: BLOOD  Result Value Ref Range Status   Specimen Description   Final    BLOOD RIGHT ANTECUBITAL Performed at [facility], [address]  [place], [place] [address]    Special Requests   Final    BOTTLES DRAWN AEROBIC AND ANAEROBIC Blood Culture adequate volume Performed at [facility], [address] [place], [place] [address]    Culture   Final    NO GROWTH 5 DAYS Performed at [facility], [address] [place], [place] [address]    Report Status 04/01/2023 FINAL  Final  Respiratory (~20 pathogens) panel by PCR     Status: None   Collection Time: 03/27/23  1:02 PM   Specimen: Nasopharyngeal Swab; Respiratory  Result Value Ref Range Status   Adenovirus NOT DETECTED NOT DETECTED Final   Coronavirus 229E NOT DETECTED NOT DETECTED Final    Comment: (NOTE) The Coronavirus on the Respiratory Panel, DOES NOT test for the novel  Coronavirus (2019 nCoV)    Coronavirus HKU1 NOT DETECTED NOT DETECTED Final   Coronavirus NL63 NOT DETECTED NOT DETECTED Final   Coronavirus OC43 NOT DETECTED NOT DETECTED Final   Metapneumovirus NOT DETECTED NOT DETECTED Final   Rhinovirus / Enterovirus NOT DETECTED NOT DETECTED Final   Influenza A NOT DETECTED NOT DETECTED Final   Influenza B NOT DETECTED NOT DETECTED Final   Parainfluenza Virus 1 NOT DETECTED NOT DETECTED Final   Parainfluenza Virus 2 NOT DETECTED NOT DETECTED Final   Parainfluenza Virus 3 NOT DETECTED NOT DETECTED Final   Parainfluenza  Virus 4 NOT DETECTED NOT DETECTED Final   Respiratory Syncytial Virus NOT DETECTED NOT DETECTED Final   Bordetella pertussis NOT DETECTED NOT DETECTED Final   Bordetella Parapertussis NOT DETECTED NOT DETECTED Final   Chlamydophila pneumoniae NOT DETECTED NOT DETECTED Final   Mycoplasma pneumoniae NOT DETECTED NOT DETECTED Final    Comment: Performed at [facility], [address] [place], [place] [address]    Radiology Studies: No results found.    Marlo Arriola T. Kiegan Macaraeg [facility]  If 7PM-7AM, please contact night-coverage [email or web address] 04/03/2023, 5:31 PM home

## 2023-04-03 NOTE — Plan of Care (Signed)

## 2023-04-03 NOTE — Progress Notes (Signed)
   04/02/23 2330  BiPAP/CPAP/SIPAP  BiPAP/CPAP/SIPAP Pt Type Adult  BiPAP/CPAP/SIPAP DREAMSTATIOND  Mask Type Full face mask (pts home mask)  Mask Size Medium  Respiratory Rate 22 breaths/min  IPAP 12 cmH20  EPAP 8 cmH2O  Flow Rate 5 lpm  Patient Home Equipment No  Auto Titrate No  CPAP/SIPAP surface wiped down Yes  BiPAP/CPAP /SiPAP Vitals  Pulse Rate 73  Resp (!) 24  SpO2 98 %  Bilateral Breath Sounds Diminished  MEWS Score/Color  MEWS Score 1  MEWS Score Color Green

## 2023-04-03 NOTE — Progress Notes (Signed)
Physical Therapy Treatment Patient Details Name: Kathleen [name] MRN: [id number] DOB: 11-Jan-1946 Today's Date: 04/03/2023   History of Present Illness The pt is a 77 yo female presenting 10/18 with Acute exacerbation COPD, Pneumonia, lactic acidosis, severe sepsis   Chronic respiratory failure with hypoxia. Pt with admission last month to [facility] with brown stool/discharge from vagina x1 week. 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  August previous admission to [facility] for COPD exacerbation.  PMH includes: COPD on 4-5L O2, asthma, OSA on bipap, afib, CHF.    PT Comments  Pt only c/o feeling "groggy" today from sleep aid medication last night but otherwise well.  Pt ambulated in hallway and maintained 4L O2 [place].  SPO2 99% at rest, 94% ambulating and 98% upon sitting in recliner (on maintained 4L).  Pt anticipates d/c home tomorrow.     If plan is discharge home, recommend the following: A little help with walking and/or transfers;Help with stairs or ramp for entrance;A little help with bathing/dressing/bathroom   Can travel by private vehicle        Equipment Recommendations  None recommended by PT    Recommendations for Other Services       Precautions / Restrictions Precautions Precautions: Fall Precaution Comments: monitor SPO2     Mobility  Bed Mobility Overal bed mobility: Modified Independent                  Transfers Overall transfer level: Needs assistance Equipment used: Rolling walker (2 wheels) Transfers: Sit to/from Stand Sit to Stand: Supervision           General transfer comment: supervision for line    Ambulation/Gait Ambulation/Gait assistance: Supervision Gait Distance (Feet): 160 Feet Assistive device: Rolling walker (2 wheels) Gait Pattern/deviations: Trunk flexed, Step-through pattern Gait velocity: decreased     General Gait Details: pt appears steady with RW, pt denies dyspnea but reports feeling "groggy" from sleep aid med  last night; ambulated on 4L O2 [place] and SPO2 94%   Stairs             Wheelchair Mobility     Tilt Bed    Modified Rankin (Stroke Patients Only)       Balance                                            Cognition Arousal: Alert Behavior During Therapy: WFL for tasks assessed/performed Overall Cognitive Status: Within Functional Limits for tasks assessed                                          Exercises      General Comments        Pertinent Vitals/Pain Pain Assessment Pain Assessment: No/denies pain    Home Living                          Prior Function            PT Goals (current goals can now be found in the care plan section) Acute Rehab PT Goals PT Goal Formulation: With patient Time For Goal Achievement: 04/17/23 Potential to Achieve Goals: Good Progress towards PT goals: Progressing toward goals    Frequency    Min 1X/week  PT Plan      Co-evaluation              AM-PAC PT "6 Clicks" Mobility   Outcome Measure  Help needed turning from your back to your side while in a flat bed without using bedrails?: None Help needed moving from lying on your back to sitting on the side of a flat bed without using bedrails?: None Help needed moving to and from a bed to a chair (including a wheelchair)?: None Help needed standing up from a chair using your arms (e.g., wheelchair or bedside chair)?: None Help needed to walk in hospital room?: A Little Help needed climbing 3-5 steps with a railing? : A Little 6 Click Score: 22    End of Session Equipment Utilized During Treatment: Gait belt Activity Tolerance: Patient tolerated treatment well Patient left: in chair;with call bell/phone within reach   PT Visit Diagnosis: Difficulty in walking, not elsewhere classified (R26.2)     Time: 8119-1478 PT Time Calculation (min) (ACUTE ONLY): 18 min  Charges:    $Gait Training: 8-22 mins PT  General Charges $$ ACUTE PT VISIT: 1 Visit                     Paulino Door, DPT Physical Therapist Acute Rehabilitation Services Office: [phone]    Kathleen [name] 04/03/2023, 4:29 PM

## 2023-04-04 DIAGNOSIS — J441 Chronic obstructive pulmonary disease with (acute) exacerbation: Secondary | ICD-10-CM | POA: Diagnosis not present

## 2023-04-04 DIAGNOSIS — D6869 Other thrombophilia: Secondary | ICD-10-CM | POA: Diagnosis not present

## 2023-04-04 DIAGNOSIS — J9620 Acute and chronic respiratory failure, unspecified whether with hypoxia or hypercapnia: Secondary | ICD-10-CM | POA: Diagnosis not present

## 2023-04-04 DIAGNOSIS — I5022 Chronic systolic (congestive) heart failure: Secondary | ICD-10-CM | POA: Diagnosis not present

## 2023-04-04 DIAGNOSIS — M545 Low back pain, unspecified: Secondary | ICD-10-CM | POA: Diagnosis not present

## 2023-04-04 LAB — BASIC METABOLIC PANEL
Anion gap: 13 (ref 5–15)
Anion gap: 14 (ref 5–15)
BUN: 32 mg/dL — ABNORMAL HIGH (ref 8–23)
BUN: 27 mg/dL — ABNORMAL HIGH (ref 8–23)
CO2: 31 mmol/L (ref 22–32)
CO2: 25 mmol/L (ref 22–32)
Calcium: 8.5 mg/dL — ABNORMAL LOW (ref 8.9–10.3)
Calcium: 8.9 mg/dL (ref 8.9–10.3)
Chloride: 95 mmol/L — ABNORMAL LOW (ref 98–111)
Chloride: 94 mmol/L — ABNORMAL LOW (ref 98–111)
Creatinine, Ser: 0.86 mg/dL (ref 0.44–1.00)
Creatinine, Ser: 0.56 mg/dL (ref 0.44–1.00)
GFR, Estimated: >60 mL/min (ref >60)
GFR, Estimated: >60 mL/min (ref >60)
Glucose, Bld: 114 mg/dL — ABNORMAL HIGH (ref 70–99)
Glucose, Bld: 150 mg/dL — ABNORMAL HIGH (ref 70–99)
Potassium: 3 mmol/L — ABNORMAL LOW (ref 3.5–5.1)
Potassium: 3.4 mmol/L — ABNORMAL LOW (ref 3.5–5.1)
Sodium: 139 mmol/L (ref 135–145)
Sodium: 133 mmol/L — ABNORMAL LOW (ref 135–145)

## 2023-04-04 LAB — CBC
HCT: 38.5 % (ref 36.0–46.0)
Hemoglobin: 11.7 g/dL — ABNORMAL LOW (ref 12.0–15.0)
MCH: 26.7 pg (ref 26.0–34.0)
MCHC: 30.4 g/dL (ref 30.0–36.0)
MCV: 87.9 fL (ref 80.0–100.0)
Platelets: 250 10*3/uL (ref 150–400)
RBC: 4.38 MIL/uL (ref 3.87–5.11)
RDW: 14.6 % (ref 11.5–15.5)
WBC: 12.2 10*3/uL — ABNORMAL HIGH (ref 4.0–10.5)
nRBC: 0 % (ref 0.0–0.2)

## 2023-04-04 LAB — GLUCOSE, CAPILLARY
Glucose-Capillary: 113 mg/dL — ABNORMAL HIGH (ref 70–99)
Glucose-Capillary: 170 mg/dL — ABNORMAL HIGH (ref 70–99)

## 2023-04-04 MED ORDER — DOXYCYCLINE HYCLATE 100 MG PO TABS: 100.0000 mg | ORAL_TABLET | Freq: Two times a day (BID) | ORAL | 0 refills | Status: DC

## 2023-04-04 MED ORDER — POTASSIUM CHLORIDE CRYS ER 20 MEQ PO TBCR: 40.0000 meq | EXTENDED_RELEASE_TABLET | ORAL | Status: DC

## 2023-04-04 MED ORDER — METRONIDAZOLE 500 MG PO TABS: 500.0000 mg | ORAL_TABLET | Freq: Two times a day (BID) | ORAL | 0 refills | Status: DC

## 2023-04-04 MED ORDER — PANTOPRAZOLE SODIUM 40 MG PO TBEC: 40.0000 mg | DELAYED_RELEASE_TABLET | Freq: Two times a day (BID) | ORAL | Status: DC

## 2023-04-04 MED ORDER — PANTOPRAZOLE SODIUM 40 MG PO TBEC: 40.0000 mg | DELAYED_RELEASE_TABLET | Freq: Every day | ORAL | 0 refills | Status: DC

## 2023-04-04 MED ORDER — POTASSIUM CHLORIDE CRYS ER 20 MEQ PO TBCR: EXTENDED_RELEASE_TABLET | ORAL | Status: DC

## 2023-04-04 MED ORDER — POTASSIUM CHLORIDE CRYS ER 20 MEQ PO TBCR
40.0000 meq | EXTENDED_RELEASE_TABLET | ORAL | Status: AC
  Administered 2023-04-04 (×2): 40 meq via ORAL
  Filled 2023-04-04 (×2): qty 2

## 2023-04-04 MED ORDER — PREDNISONE 50 MG PO TABS
50.0000 mg | ORAL_TABLET | Freq: Every day | ORAL | Status: DC
  Administered 2023-04-04: 50 mg via ORAL
  Filled 2023-04-04: qty 1

## 2023-04-04 MED ORDER — NYSTATIN 100000 UNIT/ML MT SUSP: 5.0000 mL | Freq: Four times a day (QID) | OROMUCOSAL | 0 refills | Status: DC

## 2023-04-04 MED ORDER — GUAIFENESIN ER 600 MG PO TB12: 600.0000 mg | ORAL_TABLET | Freq: Two times a day (BID) | ORAL | 0 refills | Status: DC

## 2023-04-04 MED ORDER — PREDNISONE 10 MG PO TABS
ORAL_TABLET | ORAL | 0 refills | Status: AC
Start: 2023-04-04 — End: 2023-04-16

## 2023-04-04 MED ORDER — HYDROCOD POLI-CHLORPHE POLI ER 10-8 MG/5ML PO SUER: 5.0000 mL | Freq: Two times a day (BID) | ORAL | 0 refills | Status: DC | PRN

## 2023-04-04 NOTE — Plan of Care (Signed)

## 2023-04-04 NOTE — TOC Transition Note (Signed)
Transition of Care [facility]) - CM/SW Discharge Note   Patient Details  Name: Kathleen [name] MRN: [id number] Date of Birth: 1945/08/04  Transition of Care [facility] CM/SW Contact:  Lanier Clam, RN Phone Number: 04/04/2023, 12:23 PM   Clinical Narrative: Patient d/c plan return back to [facility] living-spoke to Dickinson @ facility;Has home 02([employer]) patient   will have her son bring travel tank to rm.[place] own transport. Faxed w/confirmation to [facility] HHPT order, & face to face order await HHOT order-fax#[phone]. MD notified of HHOT to add to [facility] order.      Final next level of care: Home w Home Health Services Barriers to Discharge: No Barriers Identified   Patient Goals and CMS Choice CMS Medicare.gov Compare Post Acute Care list provided to:: Patient Choice offered to / list presented to : Patient  Discharge Placement                         Discharge Plan and Services Additional resources added to the After Visit Summary for       Post Acute Care Choice: Home Health            DME Agency: NA       HH Arranged: PT, OT HH Agency: Other - See comment Arts development officer w/[facility] inside there bldg for HHPT/OT.) Date HH Agency Contacted: 04/04/23 Time HH Agency Contacted: 1222 Representative spoke with at [facility] Agency: [employer]  Social Determinants of Health (SDOH) Interventions SDOH Screenings   Food Insecurity: No Food Insecurity (03/27/2023)  Housing: Low Risk  (03/27/2023)  Transportation Needs: No Transportation Needs (03/27/2023)  Utilities: Not At Risk (03/27/2023)  Alcohol Screen: Low Risk  (05/21/2022)  Depression (PHQ2-9): Low Risk  (05/21/2022)  Financial Resource Strain: Low Risk  (05/21/2022)  Physical Activity: Insufficiently Active (05/21/2022)  Social Connections: Moderately Isolated (05/21/2022)  Stress: No Stress Concern Present (05/21/2022)  Tobacco Use: Medium Risk (03/26/2023)     Readmission Risk Interventions     01/14/2023   11:16 AM 05/04/2022    3:37 PM  Readmission Risk Prevention Plan  Transportation Screening Complete Complete  PCP or Specialist Appt within 5-7 Days Complete Complete  Home Care Screening Complete Complete  Medication Review (RN CM) Complete Complete

## 2023-04-04 NOTE — Progress Notes (Signed)
NAME:  Kathleen [name], MRN:  [id number], DOB:  [birth year], LOS: 9 ADMISSION DATE:  03/26/2023, CONSULTATION DATE:  03/29/2023 REFERRING MD:  Dr. Marland Mcalpine - [facility], CHIEF COMPLAINT:  Acute respiratory distress    History of Present Illness:  Kathleen [name] is a 77 y.o. with a past medical history significant for [place], OSA on BIPAP, COPD, chronic hypoxic respiratory failure on 5 L nasal cannula at baseline, spontaneous pneumothorax, asthma CHF, complete heart block now s/pacemaker, HFrEF, HTN, atrial fibrillation, and colovesicular fistula who presented to the ED 10/18 with complaint of nonproductive cough with wheezing and low-grade fever.  Pertinent  Medical History  Boop, OSA on BIPAP, COPD, chronic hypoxic respiratory failure on 5 L nasal cannula at baseline, spontaneous pneumothorax, asthma CHF, complete heart block now s/pacemaker, HFrEF, HTN, atrial fibrillation, and colovesicular fistula   Significant Hospital Events: Including procedures, antibiotic start and stop dates in addition to other pertinent events   10/18 presented with both GI and respiratory complaints with extensive pulmonary history 10/21 PCCM consulted  10/23 - Feels about the same. Cough remains biggest complaint 10/24 - 10/24 - still on 5L [place] says a year ago o2 need went up from 2 -> 4l [place] and for last few weeks on 5L [place].  She thinks she has pneumonia bu CT chest 03/30/23 shows emphsyeam with cor pulmonarle and basal atlectasis. C.o wworsening edema - known chronic s-CHF ef 40% , last echo April 2024. BNP  normal this admit but eedema is new  10/25 - diarrrhea wit contrast. Got Lasix. Feels cough is still rough.  Feels mucus stuck. Same o2 needs - not worse. Able to go to toilet on own.  Had CTABP : lung clear other than old linteear scarring  Interim History / Subjective:  .  10/27 - [facility] planning discharge. Patient other than cough feels well. Pulse ox 97% on 4L[place] -> made her sit/stand x 5 and she did not desaturate  or get dyspneic . Has cough which is main complaint. This is despite hypertonic saline neb. Unable to get ABG 2d ago  Objective   Blood pressure (!) 122/53, pulse 69, temperature 98.4 F (36.9 C), temperature source Oral, resp. rate 19, height 5' (1.524 m), weight 85.3 kg, SpO2 97%.    FiO2 (%):  [40 %] 40 %   Intake/Output Summary (Last 24 hours) at 04/04/2023 0859 Last data filed at 04/03/2023 1300 Gross per 24 hour  Intake 480 ml  Output --  Net 480 ml    Filed Weights   04/01/23 0500 04/03/23 0555 04/04/23 0622  Weight: 89 kg 86.4 kg 85.3 kg    Examination: General: No distress. Obese. Sitting on side of chair. Talking on phone. Coughed when I entered room . Later less cough Neuro: Alert and Oriented x 3. GCS 15. Speech normal Psych: Pleasant Resp:  Barrel Chest - nbo.  Wheeze - no, Crackles - o, No overt respiratory distress CVS: Normal heart sounds. Murmurs - no Ext: Stigmata of Connective Tissue Disease - no HEENT: Normal upper airway. PEERL +. No post nasal drip. 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 Pulse ox with exercise is ok   P: -  Continue supplemental oxygen - can reduce to 4L [place]  - Schedule bronchodilators- Would ideally change all inhaled therapies to nebs, but she has allergies to budesonide and arformoterol.  Joint pain was so severe she does not want to escalate  therapy.  Currently on Breo and yupelri  - Solumedrol continue 40mg  BID -> 10/25 change to once a day -> 10/26: stat Po start PO -> taper and stop over a week or so  - Tussionex for course  - Incentive spirometry and flutter valve  Abx and diuresis per primary  Check CBC with diff and IgE as opd to see if any role for biologic   Other problems per primary - Acute diverticulitis - for repeat CT 10/24 - Colovaginal fistula  Best Practice (right click and "Reselect all SmartList Selections" daily)  Per primary  FU with Dr Vassie Loll as below  Future Appointments  Date Time Provider Department Center  04/05/2023  7:35 AM [facility] DEVICE REMOTES [facility]  04/06/2023  2:10 PM Mansouraty, Netty Starring., MD [facility]  04/07/2023  3:15 PM Oretha Milch, MD [facility]  04/12/2023  2:20 PM Fuller Plan, MD [facility] None  04/14/2023 11:30 AM Colletta Maryland, RN [facility] None  05/20/2023 11:00 AM Alphonzo Severance R, PA [facility] None  06/30/2023  2:20 PM [facility] DEVICE REMOTES [facility]  07/29/2023 10:20 AM Dimple Casey, Jamesetta Orleans, MD [facility] None  09/23/2023 10:15 AM Windell Norfolk, MD [facility] None  09/29/2023  2:20 PM [facility] DEVICE REMOTES [facility]  12/29/2023  2:20 PM [facility] DEVICE REMOTES [facility]      SIGNATURE    Dr. Kalman Shan, M.D., F.C.C.P,  Pulmonary and Critical Care Medicine Staff Physician, [facility] Center Director - Interstitial Lung Disease  Program  Pulmonary Fibrosis [facility] at [facility] [place], [place], [address]   Pager: [phone], If no answer  -> Check AMION or Try [phone] Telephone (clinical office): [phone] Telephone (research): [phone]  8:59 AM 04/04/2023    LABS    PULMONARY No results for input(s): "PHART", "PCO2ART", "PO2ART", "HCO3", "TCO2", "O2SAT" in the last 168 hours.  Invalid input(s): "PCO2",  "PO2"  CBC Recent Labs  Lab 04/01/23 0526 04/02/23 0703 04/04/23 0703  HGB 10.2* 11.7* 11.7*  HCT 32.3* 37.9 38.5  WBC 14.7* 15.3* 12.2*  PLT 251 312 250    COAGULATION No results for input(s): "INR" in the last 168 hours.  CARDIAC  No results for input(s): "TROPONINI" in the last 168 hours. No results for input(s): "PROBNP" in the last 168 hours.   CHEMISTRY Recent Labs  Lab 03/29/23 1013 03/30/23 0708 03/31/23 0603 04/01/23 0526 04/02/23 0703 04/03/23 0650 04/04/23 0703  NA 139 139 137 136 137  --  139  K 4.1 4.6 4.6 4.6 3.7  --  3.0*  CL 102 105 105 102 96*  --  95*  CO2 22 24 25 25 28   --  31  GLUCOSE 163* 173* 190* 188* 155*  --  114*  BUN 21 22 23  24* 31*  --  32*  CREATININE 0.70 0.62 0.79 0.64 0.83  --  0.86  CALCIUM 10.3 9.8 9.7 9.4 9.8  --  8.5*  MG 2.2 2.1 2.0 1.9  --  2.1  --   PHOS 3.3 4.0 4.6 3.4  --  3.2  --    Estimated Creatinine Clearance: 53.9 mL/min (by C-G formula based on SCr of 0.86 mg/dL).   LIVER Recent Labs  Lab 03/29/23 1013 03/30/23 0708 03/31/23 0603 04/01/23 0526  AST 17 18 14* 14*  ALT 13 14 13 13   ALKPHOS 46 39 36* 36*  BILITOT 0.6 0.4 0.6 0.4  PROT 6.8 5.7* 5.5* 5.3*  ALBUMIN 3.5 3.1* 2.9* 2.8*     INFECTIOUS Recent Labs  Lab 03/28/23 1632 03/28/23 1826  LATICACIDVEN 3.6* 2.2*     ENDOCRINE CBG (last 3)  Recent Labs    04/03/23 1624 04/03/23 2111 04/04/23 0748  GLUCAP 237* 126* 113*         IMAGING x48h  - image(s) personally visualized  -   highlighted in bold No results found.

## 2023-04-04 NOTE — Discharge Summary (Signed)
Physician Discharge Summary  LAKIE ARNOLD XBJ:[id number] DOB: 09/21/1945 DOA: 03/26/2023  PCP: [name], MD  Admit date: 03/26/2023 Discharge date: 04/04/2023 Admitted From: ILF Disposition: ALF Recommendations for Outpatient Follow-up:  Follow up with PCP in 1 to 2 weeks Outpatient follow-up with pulmonologist in 1 to 2 weeks Outpatient follow-up with cardiologist and colorectal surgery as previously planned Check CMP and CBC in 1 week Please follow up on the following pending results: None  Home Health: [facility] PT/OT Equipment/Devices: Patient has home oxygen  Discharge Condition: Stable CODE STATUS: Full code  Follow-up Information     [name], MD. Schedule an appointment as soon as possible for a visit in 1 week(s).   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  She was started on antibiotics, steroid, breathing treatments.  GI and pulmonology consulted.   Repeat CT abdomen and pelvis on 10/24 with interval decrease in previously visualized free fluid anterior to the distal sigmoid colon and in the cul-de-sac and colovaginal fistula.  GI recommended completing 14 days of antibiotics and outpatient follow-up.  She also has upcoming appointment with colorectal surgery for colovaginal fistula.  On the day of discharge, patient respiratory symptoms improved but still with some cough.  She maintained appropriate saturation with ambulation on 4 L without significant respiratory distress.  She was cleared for discharge by  pulmonology on steroid taper and home breathing treatments.  She will continue doxycycline and Flagyl for 2 more weeks for complicated diverticulitis.  She will follow-up with colorectal surgery as previously planned.  HH PT/OT ordered as recommended by therapy.  See individual problem list below for more on hospital course  Problems addressed during this hospitalization Severe sepsis in the setting of acute diverticulitis with colovaginal fistula: POA.   -Repeat CT abdomen and pelvis on 10/24 with interval improvement.   -GI recommended 14 days of Doxy and Flagyl and outpatient follow-up. -Outpatient follow-up with colorectal surgery as previously planned   Acute on chronic respiratory failure with hypoxia due to COPD exacerbation and pneumonia: Repeat CT chest on 10/22 did not show notable airspace disease -Cleared for discharge by PCCM on steroid taper and home inhalers.  Unfortunately allergic to Brovana and Pulmicort to use a long-acting nebulizers. -Pulmonology recommended CBC with differential and IgE once of steroid as outpatient to see if any role of Biologics.   Physical debility: Resides in ILF.  Voices concern about ability to function at home -[facility] PT/OT ordered.   Chronic systolic heart failure: Appears euvolemic except for trace BLE edema.  TTE with LVEF of 35 to 40%.  No BNP elevation. -Continue home meds.   Prolonged QT: QTc 519 but in the setting of wide QRS to 164. -Optimize electrolytes.  Increase home p.o. KCl for hypokalemia since patient is on Tikosyn.   Persistent atrial fibrillation: Rate controlled. Hypercoagulable state: On Xarelto. -Continue home meds.   OSA, BOOP -Continue BiPAP as needed and at night.   CAD/HTN: Stable. -Discontinue losartan due to persistent cough.   Lung Nodule: 5 mm lung  nodule noted on CT -Needs to be followed in the outpatient setting   Iron deficiency anemia: Stable -Continue home meds  Hypokalemia: Replenished prior to  discharge -Advised to take p.o. KCl 40 mEq daily for the next 2 days and then continue as previously prescribed.   Hypoalbuminemia Morbid obesity: Elevated BMI with comorbidity as above. Nutrition Problem: Increased nutrient needs Etiology: chronic illness Signs/Symptoms: estimated needs Interventions: Snacks     Time spent 45 minutes  Vital signs Vitals:   04/04/23 0747 04/04/23 0916 04/04/23 1038 04/04/23 1341  BP:  131/62 102/74 131/65  Pulse:  70 73 72  Temp:  97.7 F (36.5 C) 98.4 F (36.9 C) 98.2 F (36.8 C)  Resp:  16    Height:      Weight:      SpO2: 97% 98% 99% 97%  TempSrc:  Oral Oral Oral  BMI (Calculated):         Discharge exam  GENERAL: No apparent distress.  Nontoxic. HEENT: MMM.  Vision and hearing grossly intact.  NECK: Supple.  No apparent JVD.  RESP:  No IWOB.  Fair aeration bilaterally.  Some rhonchi bilaterally.  Intermittently coughing CVS:  RRR. Heart sounds normal.  ABD/GI/GU: BS+. Abd soft, NTND.  MSK/EXT:  Moves extremities. No apparent deformity. No edema.  SKIN: no apparent skin lesion or wound NEURO: Awake and alert. Oriented appropriately.  No apparent focal neuro deficit. PSYCH: Calm. Normal affect.   Discharge Instructions Discharge Instructions     Diet - low sodium heart healthy   Complete by: As directed    Diet Carb Modified   Complete by: As directed    Discharge instructions   Complete by: As directed    It has been a pleasure taking care of you!  You were hospitalized due to COPD exacerbation and diverticulitis.  We have treated you with steroids, breathing treatments and antibiotics.  We are discharging you more of these medications.  Please review your new medication list and the directions on your medications before you take them.  Follow-up with your lung doctor and colorectal surgery as previously planned.   Take care,   Increase activity slowly   Complete by: As directed       Allergies as of 04/04/2023        Reactions   Allopurinol Other (See Comments)   Reaction:  Dizziness    Clindamycin Anaphylaxis, Hives   Flublok [influenza Vaccine Recombinant] Other (See Comments)   Fever 103 with no alternative explanation day after vaccine.  Clydie Braun Highfill FNP-C   Pneumococcal 13-val Conj Vacc Itching, Swelling, Rash   Dronedarone Rash   Montelukast Other (See Comments)   Makes her loopy.   Brovana [arformoterol]    Caused muscle pain   Budesonide    Caused extreme joint pain   Entresto [sacubitril-valsartan] Other (See Comments)   hypotension   Fosamax [alendronate Sodium] Nausea Only   Jardiance [empagliflozin]    Caused a vaginal infection   Meperidine Nausea And Vomiting   Microplegia Msa-msg [plegisol]    Loopy,diarrhea   Rosuvastatin Other (See Comments)   Reaction:  Muscle spasms    Tetracycline Hives   Adhesive [tape] Rash   Lovastatin Rash, Other (See Comments)   Muscle Pain        Medication List     STOP taking these medications    losartan 25 MG tablet Commonly known as: COZAAR       TAKE these medications    acetaminophen 650 MG  CR tablet Commonly known as: TYLENOL Take 1,300 mg by mouth every 8 (eight) hours as needed for pain.   azelastine 0.1 % nasal spray Commonly known as: ASTELIN Place 2 sprays into both nostrils 2 (two) times daily.   Breo Ellipta 100-25 MCG/ACT Aepb Generic drug: fluticasone furoate-vilanterol INHALE 1 PUFF INTO THE LUNGS DAILY   chlorpheniramine-HYDROcodone 10-8 MG/5ML Commonly known as: TUSSIONEX Take 5 mLs by mouth every 12 (twelve) hours as needed for cough.   cyanocobalamin 1000 MCG/ML injection Commonly known as: VITAMIN B12 Inject 1 mL (1,000 mcg total) into the muscle every 30 (thirty) days.   docusate sodium 100 MG capsule Commonly known as: COLACE Take 100 mg by mouth 2 (two) times daily.   dofetilide 250 MCG capsule Commonly known as: TIKOSYN Take 1 capsule (250 mcg total) by mouth in the morning and at  bedtime.   doxycycline 100 MG tablet Commonly known as: VIBRA-TABS Take 1 tablet (100 mg total) by mouth every 12 (twelve) hours for 7 days.   gabapentin 100 MG capsule Commonly known as: Neurontin Take 2 capsules (200 mg total) by mouth at bedtime.   guaiFENesin 600 MG 12 hr tablet Commonly known as: Mucinex Take 1 tablet (600 mg total) by mouth 2 (two) times daily for 5 days.   levalbuterol 0.63 MG/3ML nebulizer solution Commonly known as: XOPENEX Take 3 mLs (0.63 mg total) by nebulization every 6 (six) hours as needed for wheezing or shortness of breath. DX J44.89 J96.21   Magnesium 500 MG Tabs Take 500 mg by mouth at bedtime.   metolazone 2.5 MG tablet Commonly known as: ZAROXOLYN Take 1 tablet (2.5 mg total) by mouth once a week. MUST TAKE OF POTASSIUM WITH THIS   metroNIDAZOLE 500 MG tablet Commonly known as: FLAGYL Take 1 tablet (500 mg total) by mouth every 12 (twelve) hours for 7 days.   Na Sulfate-K Sulfate-Mg Sulf 17.5-3.13-1.6 GM/177ML Soln Commonly known as: Suprep Bowel Prep Kit Take 1 kit by mouth as directed. For colonoscopy prep   nystatin 100000 UNIT/ML suspension Commonly known as: MYCOSTATIN Take 5 mLs (500,000 Units total) by mouth 4 (four) times daily.   OXYGEN Inhale 5 L into the lungs continuous. Use with resmed ventilator   pantoprazole 40 MG tablet Commonly known as: PROTONIX Take 1 tablet (40 mg total) by mouth daily. Start taking on: April 05, 2023   potassium chloride SA 20 MEQ tablet Commonly known as: KLOR-CON M Take two tablets daily for the next two days, then take 2 tablets (40 mEq total) with weekly Metolazone dosage. What changed: additional instructions   predniSONE 10 MG tablet Commonly known as: DELTASONE Take 4 tablets (40 mg total) by mouth daily for 2 days, THEN 3 tablets (30 mg total) daily for 2 days, THEN 3 tablets (30 mg total) daily for 2 days, THEN 2 tablets (20 mg total) daily for 2 days, THEN 1 tablet (10 mg  total) daily for 2 days, THEN 0.5 tablets (5 mg total) daily for 2 days. Start taking on: April 04, 2023   revefenacin 175 MCG/3ML nebulizer solution Commonly known as: YUPELRI Take 175 mcg by nebulization daily.   rivaroxaban 20 MG Tabs tablet Commonly known as: Xarelto Take 1 tablet (20 mg total) by mouth daily with supper.   torsemide 20 MG tablet Commonly known as: DEMADEX Take 3 tablets (60 mg total) by mouth daily. What changed: how much to take   Vitamin D3 50 MCG (2000 UT) Tabs Take 2,000 Units  by mouth at bedtime.        Consultations: Pulmonology Gastroenterology  Procedures/Studies:   CT ABDOMEN PELVIS W CONTRAST  Result Date: 04/01/2023 CLINICAL DATA:  Abdominal pain with bloody diarrhea. Of colovaginal fistula. EXAM: CT ABDOMEN AND PELVIS WITH CONTRAST TECHNIQUE: Multidetector CT imaging of the abdomen and pelvis was performed using the standard protocol following bolus administration of intravenous contrast. RADIATION DOSE REDUCTION: This exam was performed according to the departmental dose-optimization program which includes automated exposure control, adjustment of the mA and/or kV according to patient size and/or use of iterative reconstruction technique. CONTRAST:  OMNIPAQUE IOHEXOL 300 MG/ML  SOLN COMPARISON:  03/24/2023 FINDINGS: Lower chest: Heart is enlarged. Atelectasis noted at the lung bases. Hepatobiliary: Scattered tiny hypodensities in the liver parenchyma are too small to characterize but are statistically most likely benign. No followup imaging is recommended. Gallbladder is surgically absent. No intrahepatic or extrahepatic biliary dilation. Pancreas: No focal mass lesion. No dilatation of the main duct. No intraparenchymal cyst. No peripancreatic edema. Spleen: No splenomegaly. No suspicious focal mass lesion. Adrenals/Urinary Tract: No adrenal nodule or mass. Right kidney unremarkable. Stable exophytic cyst lower interpolar left kidney. No  followup imaging is recommended. No evidence for hydroureter. The urinary bladder appears normal for the degree of distention. Stomach/Bowel: Stomach is unremarkable. No gastric wall thickening. No evidence of outlet obstruction. Duodenum is normally positioned as is the ligament of Treitz. No small bowel wall thickening. No small bowel dilatation. Wall thickening and diverticulosis again noted in the sigmoid colon with pericolonic edema/inflammation consistent with diverticulitis. Interval decrease in fluid seen anterior to the distal sigmoid colon and in the cul-de-sac. There is loss of fat plane between the vaginal cuff and the distal sigmoid colon. Vaginal vault is filled with gas and high density material, features compatible with reported clinical history of colovaginal fistula. History Vascular/Lymphatic: There is mild atherosclerotic calcification of the abdominal aorta without aneurysm. There is no gastrohepatic or hepatoduodenal ligament lymphadenopathy. No retroperitoneal or mesenteric lymphadenopathy. No pelvic sidewall lymphadenopathy. Reproductive: Hysterectomy. Gas and high density material seen in the vagina as described above. There is no adnexal mass. Other: Interval decrease in free fluid seen previously. Musculoskeletal: No worrisome lytic or sclerotic osseous abnormality. IMPRESSION: 1. Interval decrease in free fluid seen previously anterior to the distal sigmoid colon and in the cul-de-sac. 2. Loss of fat plane between the vaginal cuff and the distal sigmoid colon. As before, vaginal vault is filled with gas and high density material, features compatible with reported clinical history of colovaginal fistula. 3.  Aortic Atherosclerosis (ICD10-I70.0). Electronically Signed   By: Kennith Center M.D.   On: 04/01/2023 15:00   DG CHEST PORT 1 VIEW  Result Date: 03/31/2023 CLINICAL DATA:  Shortness of breath. EXAM: PORTABLE CHEST 1 VIEW COMPARISON:  Chest radiograph and CT 03/30/2023 FINDINGS: A  pacemaker remains in place. The cardiac silhouette remains mildly enlarged. Pulmonary vascular congestion is similar to the prior radiograph, as are mild bibasilar opacities. No large pleural effusion or pneumothorax is identified. IMPRESSION: Unchanged pulmonary vascular congestion and bibasilar atelectasis. Electronically Signed   By: Sebastian Ache M.D.   On: 03/31/2023 09:24   CT CHEST WO CONTRAST  Result Date: 03/30/2023 CLINICAL DATA:  Chronic cough EXAM: CT CHEST WITHOUT CONTRAST TECHNIQUE: Multidetector CT imaging of the chest was performed following the standard protocol without IV contrast. RADIATION DOSE REDUCTION: This exam was performed according to the departmental dose-optimization program which includes automated exposure control, adjustment of the mA and/or  kV according to patient size and/or use of iterative reconstruction technique. COMPARISON:  Chest CT dated March 18, 2022 FINDINGS: Cardiovascular: Cardiomegaly. Normal caliber thoracic aorta with moderate calcified plaque. Severe coronary artery calcifications. Dilated main pulmonary artery, measuring up to 3.6 cm. CardioMEMS device in the right lower lobe pulmonary artery. Mediastinum/Nodes: Esophagus and thyroid are unremarkable. No enlarged lymph nodes seen in the chest. Lungs/Pleura: Central airways are patent. Severe centrilobular emphysema. Mild lower lung predominant linear opacities which are likely due to scarring or atelectasis. Stable subpleural solid nodule of the right lower lobe measuring 5 mm on series 6, image 104, no specific follow-up imaging is necessary given greater than 1 year stability. Upper Abdomen: Prior cholecystectomy. Musculoskeletal: No chest wall mass or suspicious bone lesions identified. IMPRESSION: 1. No acute airspace opacity. 2. Dilated main pulmonary artery, which can be seen in the setting of pulmonary hypertension. 3. Coronary artery calcifications, aortic Atherosclerosis (ICD10-I70.0) and Emphysema  (ICD10-J43.9). 4. Cardiomegaly. Electronically Signed   By: Allegra Lai M.D.   On: 03/30/2023 11:27   DG CHEST PORT 1 VIEW  Result Date: 03/30/2023 CLINICAL DATA:  Shortness of breath. EXAM: PORTABLE CHEST 1 VIEW COMPARISON:  March 29, 2023. FINDINGS: Stable cardiomediastinal silhouette. Left-sided pacemaker is unchanged. Mild central pulmonary vascular congestion is noted. Mildly increased interstitial densities are noted throughout both lungs which may represent edema. Small left pleural effusion is noted. Bibasilar atelectasis is again noted. Bony thorax is unremarkable. IMPRESSION: Stable cardiomediastinal silhouette with mild central pulmonary vascular congestion and possible bilateral pulmonary edema. Small left pleural effusion. Bibasilar subsegmental atelectasis. Electronically Signed   By: Lupita Raider M.D.   On: 03/30/2023 10:30   DG CHEST PORT 1 VIEW  Result Date: 03/29/2023 CLINICAL DATA:  Shortness of breath. EXAM: PORTABLE CHEST 1 VIEW COMPARISON:  03/28/2023 FINDINGS: The cardio pericardial silhouette is enlarged. Bibasilar atelectasis with possible tiny bilateral pleural effusions. No pulmonary edema. Left-sided permanent pacemaker again noted. Degenerative changes noted right shoulder. Telemetry leads overlie the chest. IMPRESSION: Bibasilar atelectasis with possible tiny bilateral pleural effusions. Electronically Signed   By: Kennith Center M.D.   On: 03/29/2023 19:10   DG CHEST PORT 1 VIEW  Result Date: 03/28/2023 CLINICAL DATA:  Shortness of breath. EXAM: PORTABLE CHEST 1 VIEW COMPARISON:  One-view chest x-ray 03/27/2023 FINDINGS: The heart is enlarged. Atherosclerotic calcifications are present at the aortic arch. Mild pulmonary vascular congestion is present. No focal airspace disease is present. Pacing wires are stable. Advanced degenerative changes are present in the shoulders, right greater than left. IMPRESSION: 1. Cardiomegaly and mild pulmonary vascular congestion  without frank edema. 2. Aortic atherosclerosis. Electronically Signed   By: Marin Roberts M.D.   On: 03/28/2023 10:26   CT ABDOMEN PELVIS W CONTRAST  Result Date: 03/27/2023 CLINICAL DATA:  History of diverticulitis and colovaginal fistula, left lower quadrant pain EXAM: CT ABDOMEN AND PELVIS WITH CONTRAST TECHNIQUE: Multidetector CT imaging of the abdomen and pelvis was performed using the standard protocol following bolus administration of intravenous contrast. RADIATION DOSE REDUCTION: This exam was performed according to the departmental dose-optimization program which includes automated exposure control, adjustment of the mA and/or kV according to patient size and/or use of iterative reconstruction technique. CONTRAST:  OMNIPAQUE IOHEXOL 300 MG/ML  SOLN COMPARISON:  02/22/2023 FINDINGS: Lower chest: Lung bases are free of acute infiltrate or sizable effusion. Stable 5 mm right lower lobe nodule is seen. Hepatobiliary: Few scattered cysts are again noted within the liver. The gallbladder has been surgically  removed. Pancreas: Unremarkable. No pancreatic ductal dilatation or surrounding inflammatory changes. Spleen: Normal in size without focal abnormality. Adrenals/Urinary Tract: Adrenal glands are within normal limits. Left renal cyst is again noted and stable. No follow-up is recommended. Normal excretion is noted bilaterally. No calculi or obstructive changes are seen. Ureters are within normal limits. The bladder is decompressed. Stomach/Bowel: Persistent inflammatory changes are noted in the sigmoid colon consistent with diverticulitis. No discrete abscess is identified at this time. No evidence of perforation is seen. There is however a persistent hyperdense tract extending from the sigmoid to the vaginal cuff consistent with a colovaginal fistula. Air is noted within the vagina. More proximal colon shows no acute abnormality. The appendix is not well visualized. Small bowel and stomach  are unremarkable. Vascular/Lymphatic: Aortic atherosclerosis. No enlarged abdominal or pelvic lymph nodes. Reproductive: Uterus has been surgically removed. There is air again noted within the vaginal vault with findings consistent with a colovaginal fistula stable from the prior exam. No adnexal mass is seen. Other: No abdominal wall hernia or abnormality. No abdominopelvic ascites. Musculoskeletal: Degenerative changes of the lumbar spine are noted. IMPRESSION: Persistent changes consistent with a colovaginal fistula. Persistent acute sigmoid diverticulitis is identified as well. No discrete abscess is noted. Stable 5 mm nodule in the right lower lobe. No follow-up needed if patient is low-risk. Non-contrast chest CT can be considered in 12 months if patient is high-risk. This recommendation follows the consensus statement: Guidelines for Management of Incidental Pulmonary Nodules Detected on CT Images: From the Fleischner Society 2017; Radiology 2017; 284:228-243. Electronically Signed   By: Alcide Clever M.D.   On: 03/27/2023 17:16   DG CHEST PORT 1 VIEW  Result Date: 03/27/2023 CLINICAL DATA:  Shortness of breath EXAM: PORTABLE CHEST 1 VIEW COMPARISON:  Film from the previous day. FINDINGS: Cardiac shadow is stable. Pacing device is again seen and stable. The lungs are hyperinflated but clear. No focal infiltrate or effusion is noted. No bony abnormality is seen. IMPRESSION: Hyperinflation without focal infiltrate. Electronically Signed   By: Alcide Clever M.D.   On: 03/27/2023 17:09   DG Chest Portable 1 View  Result Date: 03/26/2023 CLINICAL DATA:  Cough for 4 days EXAM: PORTABLE CHEST 1 VIEW COMPARISON:  02/22/2023 FINDINGS: Left-sided pacing device. Cardiomegaly with probable small pleural effusions and hazy basilar atelectasis or infiltrates. Aortic atherosclerosis IMPRESSION: Cardiomegaly with probable small pleural effusions and hazy basilar atelectasis or infiltrates. Electronically Signed   By:  Jasmine Pang M.D.   On: 03/26/2023 20:58       The results of significant diagnostics from this hospitalization (including imaging, microbiology, ancillary and laboratory) are listed below for reference.     Microbiology: Recent Results (from the past 240 hour(s))  Resp panel by RT-PCR (RSV, Flu A&B, Covid) Anterior Nasal Swab     Status: None   Collection Time: 03/26/23  8:16 PM   Specimen: Anterior Nasal Swab  Result Value Ref Range Status   SARS Coronavirus 2 by RT PCR NEGATIVE NEGATIVE Final    Comment: (NOTE) SARS-CoV-2 target nucleic acids are NOT DETECTED.  The SARS-CoV-2 RNA is generally detectable in upper respiratory specimens during the acute phase of infection. The lowest concentration of SARS-CoV-2 viral copies this assay can detect is 138 copies/mL. A negative result does not preclude SARS-Cov-2 infection and should not be used as the sole basis for treatment or other patient management decisions. A negative result may occur with  improper specimen collection/handling, submission of specimen other than  nasopharyngeal swab, presence of viral mutation(s) within the areas targeted by this assay, and inadequate number of viral copies(<138 copies/mL). A negative result must be combined with clinical observations, patient history, and epidemiological information. The expected result is Negative.  Fact Sheet for Patients:  BloggerCourse.com  Fact Sheet for Healthcare Providers:  SeriousBroker.it  This test is no t yet approved or cleared by the [place] FDA and  has been authorized for detection and/or diagnosis of SARS-CoV-2 by FDA under an Emergency Use Authorization (EUA). This EUA will remain  in effect (meaning this test can be used) for the duration of the COVID-19 declaration under Section 564(b)(1) of the Act, 21 U.S.C.section 360bbb-3(b)(1), unless the authorization is terminated  or revoked sooner.        Influenza A by PCR NEGATIVE NEGATIVE Final   Influenza B by PCR NEGATIVE NEGATIVE Final    Comment: (NOTE) The Xpert Xpress SARS-CoV-2/FLU/RSV plus assay is intended as an aid in the diagnosis of influenza from Nasopharyngeal swab specimens and should not be used as a sole basis for treatment. Nasal washings and aspirates are unacceptable for Xpert Xpress SARS-CoV-2/FLU/RSV testing.  Fact Sheet for Patients: BloggerCourse.com  Fact Sheet for Healthcare Providers: SeriousBroker.it  This test is not yet approved or cleared by the [place] FDA and has been authorized for detection and/or diagnosis of SARS-CoV-2 by FDA under an Emergency Use Authorization (EUA). This EUA will remain in effect (meaning this test can be used) for the duration of the COVID-19 declaration under Section 564(b)(1) of the Act, 21 U.S.C. section 360bbb-3(b)(1), unless the authorization is terminated or revoked.     Resp Syncytial Virus by PCR NEGATIVE NEGATIVE Final    Comment: (NOTE) Fact Sheet for Patients: BloggerCourse.com  Fact Sheet for Healthcare Providers: SeriousBroker.it  This test is not yet approved or cleared by the [place] FDA and has been authorized for detection and/or diagnosis of SARS-CoV-2 by FDA under an Emergency Use Authorization (EUA). This EUA will remain in effect (meaning this test can be used) for the duration of the COVID-19 declaration under Section 564(b)(1) of the Act, 21 U.S.C. section 360bbb-3(b)(1), unless the authorization is terminated or revoked.  Performed at [facility], [address] [place], [place] [address]   Blood culture (routine x 2)     Status: None   Collection Time: 03/26/23  8:20 PM   Specimen: BLOOD  Result Value Ref Range Status   Specimen Description   Final    BLOOD LEFT ANTECUBITAL Performed at [facility], [address], [place], [place] [address]    Special Requests   Final    BOTTLES DRAWN AEROBIC AND ANAEROBIC Blood Culture results may not be optimal due to an excessive volume of blood received in culture bottles Performed at [facility], [address], [place], [place] [address]    Culture   Final    NO GROWTH 5 DAYS Performed at [facility], [address] [place], [place] [address]    Report Status 04/01/2023 FINAL  Final  Blood culture (routine x 2)     Status: None   Collection Time: 03/26/23  8:30 PM   Specimen: BLOOD  Result Value Ref Range Status   Specimen Description   Final    BLOOD RIGHT ANTECUBITAL Performed at [facility], [address] [place], [place] [address]    Special Requests   Final    BOTTLES DRAWN AEROBIC AND ANAEROBIC Blood Culture adequate  volume Performed at [facility], [address] [place], [place] [address]    Culture   Final    NO GROWTH 5 DAYS Performed at [facility], [address] [place], [place] [address]    Report Status 04/01/2023 FINAL  Final  Respiratory (~20 pathogens) panel by PCR     Status: None   Collection Time: 03/27/23  1:02 PM   Specimen: Nasopharyngeal Swab; Respiratory  Result Value Ref Range Status   Adenovirus NOT DETECTED NOT DETECTED Final   Coronavirus 229E NOT DETECTED NOT DETECTED Final    Comment: (NOTE) The Coronavirus on the Respiratory Panel, DOES NOT test for the novel  Coronavirus (2019 nCoV)    Coronavirus HKU1 NOT DETECTED NOT DETECTED Final   Coronavirus NL63 NOT DETECTED NOT DETECTED Final   Coronavirus OC43 NOT DETECTED NOT DETECTED Final   Metapneumovirus NOT DETECTED NOT DETECTED Final   Rhinovirus / Enterovirus NOT DETECTED NOT DETECTED Final   Influenza A NOT DETECTED NOT DETECTED Final   Influenza B NOT DETECTED NOT DETECTED Final   Parainfluenza Virus 1 NOT DETECTED NOT DETECTED Final    Parainfluenza Virus 2 NOT DETECTED NOT DETECTED Final   Parainfluenza Virus 3 NOT DETECTED NOT DETECTED Final   Parainfluenza Virus 4 NOT DETECTED NOT DETECTED Final   Respiratory Syncytial Virus NOT DETECTED NOT DETECTED Final   Bordetella pertussis NOT DETECTED NOT DETECTED Final   Bordetella Parapertussis NOT DETECTED NOT DETECTED Final   Chlamydophila pneumoniae NOT DETECTED NOT DETECTED Final   Mycoplasma pneumoniae NOT DETECTED NOT DETECTED Final    Comment: Performed at [facility], [address] [place], [place] [address]     Labs:  CBC: Recent Labs  Lab 03/29/23 1013 03/29/23 1459 03/30/23 0708 03/31/23 0603 04/01/23 0526 04/02/23 0703 04/04/23 0703  WBC 14.6*  --  12.3* 16.4* 14.7* 15.3* 12.2*  NEUTROABS 12.3*  --  10.1* 14.3*  --   --   --   HGB 11.0*   < > 10.3* 10.2* 10.2* 11.7* 11.7*  HCT 36.1   < > 33.6* 33.6* 32.3* 37.9 38.5  MCV 88.5  --  87.0 88.4 86.8 85.7 87.9  PLT 284  --  240 242 251 312 250   < > = values in this interval not displayed.   BMP &GFR Recent Labs  Lab 03/29/23 1013 03/30/23 0708 03/31/23 0603 04/01/23 0526 04/02/23 0703 04/03/23 0650 04/04/23 0703 04/04/23 1403  NA 139 139 137 136 137  --  139 133*  K 4.1 4.6 4.6 4.6 3.7  --  3.0* 3.4*  CL 102 105 105 102 96*  --  95* 94*  CO2 22 24 25 25 28   --  31 25  GLUCOSE 163* 173* 190* 188* 155*  --  114* 150*  BUN 21 22 23  24* 31*  --  32* 27*  CREATININE 0.70 0.62 0.79 0.64 0.83  --  0.86 0.56  CALCIUM 10.3 9.8 9.7 9.4 9.8  --  8.5* 8.9  MG 2.2 2.1 2.0 1.9  --  2.1  --   --   PHOS 3.3 4.0 4.6 3.4  --  3.2  --   --    Estimated Creatinine Clearance: 58 mL/min (by C-G formula based on SCr of 0.56 mg/dL). Liver & Pancreas: Recent Labs  Lab 03/29/23 1013 03/30/23 0708 03/31/23 0603 04/01/23 0526  AST 17 18 14* 14*  ALT 13 14 13 13   ALKPHOS 46 39 36* 36*  BILITOT 0.6 0.4 0.6 0.4  PROT 6.8 5.7* 5.5* 5.3*  ALBUMIN 3.5 3.1* 2.9* 2.8*   No results for input(s):  "LIPASE", "AMYLASE" in the last 168 hours. No results for input(s): "AMMONIA" in the last 168 hours. Diabetic: No results for input(s): "HGBA1C" in the last 72 hours. Recent Labs  Lab 04/03/23 1212 04/03/23 1624 04/03/23 2111 04/04/23 0748 04/04/23 1148  GLUCAP 132* 237* 126* 113* 170*   Cardiac Enzymes: No results for input(s): "CKTOTAL", "CKMB", "CKMBINDEX", "TROPONINI" in the last 168 hours. No results for input(s): "PROBNP" in the last 8760 hours. Coagulation Profile: No results for input(s): "INR", "PROTIME" in the last 168 hours. Thyroid Function Tests: No results for input(s): "TSH", "T4TOTAL", "FREET4", "T3FREE", "THYROIDAB" in the last 72 hours. Lipid Profile: No results for input(s): "CHOL", "HDL", "LDLCALC", "TRIG", "CHOLHDL", "LDLDIRECT" in the last 72 hours. Anemia Panel: No results for input(s): "VITAMINB12", "FOLATE", "FERRITIN", "TIBC", "IRON", "RETICCTPCT" in the last 72 hours. Urine analysis:    Component Value Date/Time   COLORURINE YELLOW 03/27/2023 0434   APPEARANCEUR CLOUDY (A) 03/27/2023 0434   APPEARANCEUR Hazy 12/25/2012 1653   LABSPEC 1.005 03/27/2023 0434   LABSPEC 1.021 12/25/2012 1653   PHURINE 5.0 03/27/2023 0434   GLUCOSEU NEGATIVE 03/27/2023 0434   GLUCOSEU NEGATIVE 03/17/2023 1003   HGBUR SMALL (A) 03/27/2023 0434   BILIRUBINUR NEGATIVE 03/27/2023 0434   BILIRUBINUR Negative 12/25/2012 1653   KETONESUR NEGATIVE 03/27/2023 0434   PROTEINUR NEGATIVE 03/27/2023 0434   UROBILINOGEN 0.2 03/17/2023 1003   NITRITE NEGATIVE 03/27/2023 0434   LEUKOCYTESUR LARGE (A) 03/27/2023 0434   LEUKOCYTESUR Negative 12/25/2012 1653   Sepsis Labs: Invalid input(s): "PROCALCITONIN", "LACTICIDVEN"   SIGNED:  Almon Hercules, MD  [facility] 04/04/2023, 2:57 PM

## 2023-04-04 NOTE — Progress Notes (Signed)
AVS given and explained to patient. 

## 2023-04-04 NOTE — Progress Notes (Signed)
Lab draws needed to be done and resulted before discharge.

## 2023-04-05 ENCOUNTER — Ambulatory Visit: Payer: Medicare Other | Attending: Cardiology

## 2023-04-05 DIAGNOSIS — Z950 Presence of cardiac pacemaker: Secondary | ICD-10-CM

## 2023-04-05 DIAGNOSIS — I5022 Chronic systolic (congestive) heart failure: Secondary | ICD-10-CM

## 2023-04-05 NOTE — Patient Outreach (Signed)
Care Coordination   Follow Up Visit Note   04/02/2023 Name: Kathleen [name] MRN: [id number] DOB: 1945-07-13  Kathleen [name] is a 77 y.o. year old female who sees [name], MD for primary care. I spoke with  [name] by phone today.  What matters to the patients health and wellness today?  Housing    Goals Addressed             This Visit's Progress    Obtain Supportive Resources-Housing   On track    Activities and task to complete in order to accomplish goals.   Keep all upcoming appointments discussed today Continue with compliance of taking medication prescribed by Doctor Implement healthy coping skills discussed to assist with management of symptoms Use housing resources/websites discussed to review local options         SDOH assessments and interventions completed:  No     Care Coordination Interventions:  Yes, provided  Interventions Today    Flowsheet Row Most Recent Value  Chronic Disease   Chronic disease during today's visit Congestive Heart Failure (CHF), Atrial Fibrillation (AFib), Chronic Obstructive Pulmonary Disease (COPD), Chronic Kidney Disease/End Stage Renal Disease (ESRD), Other  [Depression]  General Interventions   General Interventions Discussed/Reviewed General Interventions Reviewed, [facility], Doctor Visits  [Patient reports that she is admitted in the hospital. Pt discussed goals she would like to focus on at discharge to assist with housing]  Doctor Visits Discussed/Reviewed Doctor Visits Reviewed  Mental Health Interventions   Mental Health Discussed/Reviewed Mental Health Reviewed, Coping Strategies       Follow up plan: Follow up call scheduled for 1-2 weeks    Encounter Outcome:  Patient Visit Completed   Jenel Lucks, MSW, LCSW [facility] Care Management [facility]  [facility] [email or web address] Phone [phone] 4:01 PM

## 2023-04-05 NOTE — Consult Note (Signed)
[facility]     04/05/2023  VICI MARZE 07/12/1945 [id number]  [facility] Accountable Care Organization [ACO] Patient:  Medicare ACO REACH  Primary Care Provider:  [name], MD with [place] at [facility] which is listed to provide the [facility] follow up and calls. .  Patient is currently active with care coordination services.  Patient has been engaged by a [employer].  The community based plan of care has focused on disease management and community resource support.    Patient is to receive a post hospital call and will be evaluated for assessments and disease process education.    Plan: Will update Nurse, children's Care Coordination services. Patient noted activity worker noted.   Of note, [facility] Care Management services does not replace or interfere with any services that are needed or arranged by inpatient [facility] care management team.   Charlesetta Shanks, RN, BSN, CCM [place]  [facility], [facility] Health [facility] Liaison Direct Dial: [phone] or secure chat Website: [email or web address]

## 2023-04-05 NOTE — Patient Instructions (Signed)
Visit Information  Thank you for taking time to visit with me today. Please don't hesitate to contact me if I can be of assistance to you.   Following are the goals we discussed today:   Goals Addressed             This Visit's Progress    Obtain Supportive Resources-Housing   On track    Activities and task to complete in order to accomplish goals.   Keep all upcoming appointments discussed today Continue with compliance of taking medication prescribed by Doctor Implement healthy coping skills discussed to assist with management of symptoms Use housing resources/websites discussed to review local options         Our next appointment is by telephone on 10/31 at 9 AM  Please call the care guide team at [phone] if you need to cancel or reschedule your appointment.   If you are experiencing a Mental Health or Behavioral Health Crisis or need someone to talk to, please call the Suicide and Crisis Lifeline: 988 call 911   Patient verbalizes understanding of instructions and care plan provided today and agrees to view in MyChart. Active MyChart status and patient understanding of how to access instructions and care plan via MyChart confirmed with patient.     Jenel Lucks, MSW, LCSW [facility] Care Management [place]  [facility] [email or web address] Phone [phone] 4:05 PM

## 2023-04-05 NOTE — Telephone Encounter (Signed)
Pharmacy has seen another request. Please advise.

## 2023-04-06 ENCOUNTER — Telehealth: Payer: Self-pay

## 2023-04-06 ENCOUNTER — Ambulatory Visit (INDEPENDENT_AMBULATORY_CARE_PROVIDER_SITE_OTHER): Payer: Medicare Other | Admitting: Gastroenterology

## 2023-04-06 ENCOUNTER — Encounter: Payer: Self-pay | Admitting: Gastroenterology

## 2023-04-06 VITALS — BP 110/68 | HR 84 | Ht 60.0 in | Wt 180.0 lb

## 2023-04-06 DIAGNOSIS — Z860100 Personal history of colon polyps, unspecified: Secondary | ICD-10-CM

## 2023-04-06 DIAGNOSIS — K5792 Diverticulitis of intestine, part unspecified, without perforation or abscess without bleeding: Secondary | ICD-10-CM

## 2023-04-06 DIAGNOSIS — N824 Other female intestinal-genital tract fistulae: Secondary | ICD-10-CM | POA: Diagnosis not present

## 2023-04-06 DIAGNOSIS — K5909 Other constipation: Secondary | ICD-10-CM

## 2023-04-06 DIAGNOSIS — R1310 Dysphagia, unspecified: Secondary | ICD-10-CM | POA: Insufficient documentation

## 2023-04-06 MED ORDER — TRULANCE 3 MG PO TABS: 3.0000 mg | ORAL_TABLET | Freq: Every day | ORAL | 6 refills | Status: DC

## 2023-04-06 NOTE — Progress Notes (Signed)
EPIC Encounter for ICM Monitoring  Patient Name: Kathleen [name] is a 77 y.o. female Date: 04/06/2023 Primary Care Physican: [name], MD Primary Cardiologist: Bensimhon Electrophysiologist: Camnitz Bi-V Pacing: 100%          07/28/2022 Weight: 184.6 lbs 09/07/2022   Weight: 187 lbs 11/17/2022 Weight: 190-191 lbs    02/04/2023 Office: 194 lbs     03/16/2023 Weight: 190 lbs       03/23/2023 Weight: 188 lbs   04/06/2023 Weight: 176 lbs                                      Spoke with patient and heart failure questions reviewed.  Transmission results reviewed.  Pt was hospitalized 10 days for pneumonia, COPD, GI issues and was septic.  She is feeling better and on antibiotics.  Swelling of the feet have almost resolved.  She has a colonoscopy in November scheduled.     Diet:  She lives in an assisted living  Eats foods that are prepared at the facility she lives in which are not low salt.     Optivol thoracic impedance suggesting fluid levels returned to normal 10/25.  Fluid index greater than normal threshold starting 10/3.   Prescribed: Starting HF Paramedicine program 10/31 Torsemide 20 mg take 3 tablet(s) (60 mg total) by mouth daily.   Potassium 20 mEq take 2 tablets (40 mEq total) weekly when taking Metolazone.  Potassium prescription changed 10/27 hospital discharge Metolazone 2.5 mg take 1 tablet by mouth as needed (for swelling or SOB).  Must take 40 mEq of Potassium with this.    Labs: 04/04/2023 Creatinine 0.56, BUN 27, Potassium 3.4, Sodium 133, GFR >60  04/04/2023 Creatinine 0.86, BUN 32, Potassium 3.0, Sodium 139, GFR >60  04/02/2023 Creatinine 0.83, BUN 31, Potassium 3.7, Sodium 137, GFR >60  04/01/2023 Creatinine 0.64, BUN 24, Potassium 4.6, Sodium 136, GFR >60 03/31/2023 Creatinine 0.79, BUN 23, Potassium 4.6, Sodium 137, GFR >60  03/30/2023 Creatinine 0.62, BUN 22, Potassium 4.6, Sodium 139, GFR >60  03/29/2023 Creatinine 0.70, BUN 21, Potassium 4.1, Sodium 139, GFR  >60  03/28/2023 Creatinine 0.68, BUN 25, Potassium 4.0, Sodium 134, GFR >60  03/27/2023 Creatinine 0.65, BUN 20, Potassium 3.7, Sodium 136, GFR >60 03/22/2023 Creatinine 0.71, BUN 7,   Potassium 4.0, Sodium 138, GFR >60 A complete set of results can be found in Results Review.   Recommendations:  Advised to continue Torsemide as prescribed.  She will discuss with paramedicine EMT to set up the weekly Metolazone dosage.      Follow-up plan: ICM clinic phone appointment on 04/13/2023 to recheck fluid levels.  91 day device clinic remote transmission 06/30/2023.     EP/Cardiology Office Visits:   Will reschedule with HF clinic for missed 10/24 appointment.   Recall 07/14/2023 with Dr Gala Romney.  Recall 05/13/2023 with Francis Dowse, PA or Otilio Saber, [place].     Copy of ICM check sent to Dr. Elberta Fortis and Dr Gala Romney as Lorain Childes.     3 month ICM trend: 04/06/2023.    12-14 Month ICM trend:     Karie Soda, RN 04/06/2023 11:08 AM

## 2023-04-06 NOTE — Patient Instructions (Signed)
We have sent the following medications to your pharmacy for you to pick up at your convenience: Trulance   Your provider has requested that you go to the basement prior to having CT scan done (04/22/23 ) . [name] Press "B" on the elevator. The lab is located at the first door on the left as you exit the elevator.  You have been scheduled for a CT scan of the abdomen and pelvis at [facility], 1st floor Radiology. You are scheduled on 04/22/23 at 12:30 pm . You should arrive 2 hrs and  15 minutes at 10: 30am , prior to your appointment time for registration.and to drink oral constrast.     Please follow the written instructions below on the day of your exam:   1) Do not eat anything after 8:30 am  (4 hours prior to your test)    You may take any medications as prescribed with a small amount of water, if necessary. If you take any of the following medications: METFORMIN, GLUCOPHAGE, GLUCOVANCE, AVANDAMET, RIOMET, FORTAMET, ACTOPLUS MET, JANUMET, GLUMETZA or METAGLIP, you MAY be asked to HOLD this medication 48 hours AFTER the exam.   The purpose of you drinking the oral contrast is to aid in the visualization of your intestinal tract. The contrast solution may cause some diarrhea. Depending on your individual set of symptoms, you may also receive an intravenous injection of x-ray contrast/dye. Plan on being at [facility] for 45 minutes or longer, depending on the type of exam you are having performed.   If you have any questions regarding your exam or if you need to reschedule, you may call [facility] at [phone] between the hours of 8:00 am and 5:00 pm, Monday-Friday.   Due to recent changes in healthcare laws, you may see the results of your imaging and laboratory studies on MyChart before your provider has had a chance to review them.  We understand that in some cases there may be results that are confusing or concerning to you. Not all laboratory results come back in the same  time frame and the provider may be waiting for multiple results in order to interpret others.  Please give [place] 48 hours in order for your provider to thoroughly review all the results before contacting the office for clarification of your results.   _______________________________________________________  If your blood pressure at your visit was 140/90 or greater, please contact your primary care physician to follow up on this.  _______________________________________________________  If you are age 19 or older, your body mass index should be between 23-30. Your Body mass index is 35.15 kg/m. If this is out of the aforementioned range listed, please consider follow up with your Primary Care Provider.  If you are age 3 or younger, your body mass index should be between 19-25. Your Body mass index is 35.15 kg/m. If this is out of the aformentioned range listed, please consider follow up with your Primary Care Provider.   ________________________________________________________  The [place] [facility] providers would like to encourage you to use [facility] to communicate with providers for non-urgent requests or questions.  Due to long hold times on the telephone, sending your provider a message by [facility] may be a faster and more efficient way to get a response.  Please allow 48 business hours for a response.  Please remember that this is for non-urgent requests.  _______________________________________________________  Thank you for choosing me and [facility].  Dr. Meridee Score

## 2023-04-06 NOTE — Progress Notes (Signed)
GASTROENTEROLOGY OUTPATIENT CLINIC VISIT   Primary Care Provider [name], MD [address] [place] [address] [phone]   Patient Profile: Kathleen [name] is a 77 y.o. 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 fib (status post prior AVN ablation and PPM on anticoagulation), hypertension, gout, CRI, iron deficiency, family history of pancreas cancer (mother), colon polyps (found on CT colonography in 2021), diverticulitis (complicated with colovaginal fistula).  The patient presents to the [facility] for an evaluation and management of problem(s) noted below:  Problem List 1. Diverticulitis   2. Colovaginal fistula   3. History of colon polyps   4. Chronic constipation   5. Pill dysphagia     Discussed the use of AI scribe software for clinical note transcription with the patient, who gave verbal consent to proceed.  History of Present Illness Please see prior notes for full details of HPI.  Interval History The patient presents for follow-up today.   The patient was recently admitted to the hospital with issues of acute on chronic respiratory failure due to her COPD exacerbation as well as an episode of septic physiology with acute diverticulitis and persisting colovaginal fistula requiring 2-week course of antibiotic therapy and also self-limited rectal bleeding felt to be a likely diverticular bleed (though no endoscopic evaluation performed).  Post hospital discharge, she continued oral antibiotics, including Flagyl and Doxycycline, for an additional few days.  As has been documented previously she has had multiple diverticulitis flares which have led to complication of colovaginal fistula.  She continues to have passage of stool from the vagina at times, but interestingly, she reports that in the last few days, she has also noticed stool passing from  the rectum, which was previously more of a rare occurrence.  She has not noted any blood in her stools since her hospitalization either.  The patient redescribes her longer standing chronic constipation since childhood, which has been managed with various laxatives.  Using Trulance samples helped her, and she wonders if she can continue this with her current colovaginal fistula.  She also reports occasional pill dysphagia.  This has improved since being in the hospital.  She denies solid or liquid dysphagia symptoms.  She has concerns about performing an upper endoscopy.   GI Review of Systems Positive as above Negative for odynophagia, nausea, vomiting, progressive abdominal pain  Review of Systems General: Denies fevers/chills Cardiovascular: Denies chest pain Pulmonary: SOB at baseline per her report (remains on 5 L O2) Gastroenterological: See HPI Genitourinary: Denies darkened urine or hematuria Hematological: Positive for history of easy bruising/bleeding due to anticoagulation Dermatological: Denies jaundice Psychological: Mood is anxious   Medications Current Outpatient Medications  Medication Sig Dispense Refill   acetaminophen (TYLENOL) 650 MG CR tablet Take 1,300 mg by mouth every 8 (eight) hours as needed for pain.     azelastine (ASTELIN) 0.1 % nasal spray Place 2 sprays into both nostrils 2 (two) times daily. 90 mL 0   chlorpheniramine-HYDROcodone (TUSSIONEX) 10-8 MG/5ML Take 5 mLs by mouth every 12 (twelve) hours as needed for cough. 120 mL 0   Cholecalciferol (VITAMIN D3) 50 MCG (2000 UT) TABS Take 2,000 Units by mouth at bedtime.     cyanocobalamin (VITAMIN B12) 1000 MCG/ML injection Inject 1 mL (1,000 mcg total) into the muscle every 30 (thirty) days. 10 mL 0   docusate sodium (COLACE) 100 MG capsule Take 100 mg  by mouth 2 (two) times daily.     dofetilide (TIKOSYN) 250 MCG capsule Take 1 capsule (250 mcg total) by mouth in the morning and at bedtime. 60 capsule 6    doxycycline (VIBRA-TABS) 100 MG tablet Take 1 tablet (100 mg total) by mouth every 12 (twelve) hours for 7 days. 14 tablet 0   febuxostat (ULORIC) 40 MG tablet TAKE 1 TABLET(40 MG) BY MOUTH DAILY 90 tablet 0   fluticasone furoate-vilanterol (BREO ELLIPTA) 100-25 MCG/ACT AEPB INHALE 1 PUFF INTO THE LUNGS DAILY 60 each 11   gabapentin (NEURONTIN) 100 MG capsule Take 2 capsules (200 mg total) by mouth at bedtime. 60 capsule 6   guaiFENesin (MUCINEX) 600 MG 12 hr tablet Take 1 tablet (600 mg total) by mouth 2 (two) times daily for 5 days. 10 tablet 0   levalbuterol (XOPENEX) 0.63 MG/3ML nebulizer solution Take 3 mLs (0.63 mg total) by nebulization every 6 (six) hours as needed for wheezing or shortness of breath. DX J44.89 J96.21 3 mL 12   Magnesium 500 MG TABS Take 500 mg by mouth at bedtime.     metolazone (ZAROXOLYN) 2.5 MG tablet Take 1 tablet (2.5 mg total) by mouth once a week. MUST TAKE OF POTASSIUM WITH THIS 12 tablet 0   metroNIDAZOLE (FLAGYL) 500 MG tablet Take 1 tablet (500 mg total) by mouth every 12 (twelve) hours for 7 days. 14 tablet 0   Na Sulfate-K Sulfate-Mg Sulf (SUPREP BOWEL PREP KIT) 17.5-3.13-1.6 GM/177ML SOLN Take 1 kit by mouth as directed. For colonoscopy prep 354 mL 0   nystatin (MYCOSTATIN) 100000 UNIT/ML suspension Take 5 mLs (500,000 Units total) by mouth 4 (four) times daily. 60 mL 0   OXYGEN Inhale 5 L into the lungs continuous. Use with resmed ventilator     pantoprazole (PROTONIX) 40 MG tablet Take 1 tablet (40 mg total) by mouth daily. 30 tablet 0   Plecanatide (TRULANCE) 3 MG TABS Take 1 tablet (3 mg total) by mouth daily. 30 tablet 6   potassium chloride SA (KLOR-CON M) 20 MEQ tablet Take two tablets daily for the next two days, then take 2 tablets (40 mEq total) with weekly Metolazone dosage.     predniSONE (DELTASONE) 10 MG tablet Take 4 tablets (40 mg total) by mouth daily for 2 days, THEN 3 tablets (30 mg total) daily for 2 days, THEN 3 tablets (30 mg total)  daily for 2 days, THEN 2 tablets (20 mg total) daily for 2 days, THEN 1 tablet (10 mg total) daily for 2 days, THEN 0.5 tablets (5 mg total) daily for 2 days. 27 tablet 0   revefenacin (YUPELRI) 175 MCG/3ML nebulizer solution Take 175 mcg by nebulization daily.     rivaroxaban (XARELTO) 20 MG TABS tablet Take 1 tablet (20 mg total) by mouth daily with supper. 30 tablet 5   torsemide (DEMADEX) 20 MG tablet Take 3 tablets (60 mg total) by mouth daily. (Patient taking differently: Take 40 mg by mouth daily.)     No current facility-administered medications for this visit.    Allergies Allergies  Allergen Reactions   Allopurinol Other (See Comments)    Reaction:  Dizziness    Clindamycin Anaphylaxis and Hives   Flublok [Influenza Vaccine Recombinant] Other (See Comments)    Fever 103 with no alternative explanation day after vaccine.  Clydie Braun Highfill FNP-C   Pneumococcal 13-Val Conj Vacc Itching, Swelling and Rash   Dronedarone Rash   Montelukast Other (See Comments)    Makes  her loopy.   Brovana [Arformoterol]     Caused muscle pain   Budesonide     Caused extreme joint pain   Entresto [Sacubitril-Valsartan] Other (See Comments)    hypotension   Fosamax [Alendronate Sodium] Nausea Only   Jardiance [Empagliflozin]     Caused a vaginal infection   Meperidine Nausea And Vomiting   Microplegia Msa-Msg [Plegisol]     Loopy,diarrhea   Rosuvastatin Other (See Comments)    Reaction:  Muscle spasms    Tetracycline Hives   Adhesive [Tape] Rash   Lovastatin Rash and Other (See Comments)    Muscle Pain    Histories 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    Concussion 10/04/2021   COPD (chronic obstructive pulmonary disease) (HCC)    Diverticulitis    Gout    Hypertension     Longstanding persistent atrial fibrillation (HCC)    on Xarelto   Nonischemic cardiomyopathy (HCC)    Obesity    Pacemaker    Spontaneous pneumothorax 2013   Past Surgical History:  Procedure Laterality Date   ABDOMINAL HYSTERECTOMY     APPENDECTOMY     ATRIAL FIBRILLATION ABLATION  07/20/2013   by Dr Christin Fudge   AV nodal ablation  11/01/2013   by Dr Christin Fudge, repeated by Dr Wilford Grist   BREAST BIOPSY Bilateral 1997   negative   CARDIAC CATHETERIZATION     CHOLECYSTECTOMY     HERNIA REPAIR     PACEMAKER INSERTION  06/2017   MDT Viva CRT-P implanted by Dr Christin Fudge after AV nodal ablation,  LV lead could not be placed   PACEMAKER INSERTION  05/2020   with lead bundle   RIGHT/LEFT HEART CATH AND CORONARY ANGIOGRAPHY N/A 03/20/2022   Procedure: RIGHT/LEFT HEART CATH AND CORONARY ANGIOGRAPHY;  Surgeon: [place], Peter M, MD;  Location: [facility] INVASIVE CV LAB;  Service: Cardiovascular;  Laterality: N/A;   Social History   Socioeconomic History   Marital status: Widowed    Spouse name: Not on file   Number of children: 1   Years of education: Not on file   Highest education level: Not on file  Occupational History   Occupation: retired  Tobacco Use   Smoking status: Former    Current packs/day: 0.00    Average packs/day: 1 pack/day for 20.0 years (20.0 ttl pk-yrs)    Types: Cigarettes    Start date: 06/09/1967    Quit date: 06/09/1987    Years since quitting: 35.8    Passive exposure: Past   Smokeless tobacco: Never   Tobacco comments:    Former smoker 12/25/21  Vaping Use   Vaping status: Never Used  Substance and Sexual Activity   Alcohol use: No    Alcohol/week: 0.0 standard drinks of alcohol   Drug use: No   Sexual activity: Not Currently  Other Topics Concern   Not on file  Social History Narrative   Pt lives in [place] alone.  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  Her son works in [employer].   Social Determinants of Health   Financial Resource Strain: Low  Risk  (05/21/2022)   Overall Financial Resource Strain (CARDIA)    Difficulty of Paying Living Expenses: Not very hard  Food Insecurity: No Food Insecurity (03/27/2023)  Hunger Vital Sign    Worried About Running Out of Food in the Last Year: Never true    Ran Out of Food in the Last Year: Never true  Transportation Needs: No Transportation Needs (03/27/2023)   PRAPARE - Administrator, Civil Service (Medical): No    Lack of Transportation (Non-Medical): No  Physical Activity: Insufficiently Active (05/21/2022)   Exercise Vital Sign    Days of Exercise per Week: 2 days    Minutes of Exercise per Session: 20 min  Stress: No Stress Concern Present (05/21/2022)   [employer] of Occupational Health - Occupational Stress Questionnaire    Feeling of Stress : Only a little  Social Connections: Moderately Isolated (05/21/2022)   Social Connection and Isolation Panel [NHANES]    Frequency of Communication with Friends and Family: More than three times a week    Frequency of Social Gatherings with Friends and Family: More than three times a week    Attends Religious Services: Never    Database administrator or Organizations: Yes    Attends Engineer, structural: More than 4 times per year    Marital Status: Widowed  Intimate Partner Violence: Not At Risk (03/27/2023)   Humiliation, Afraid, Rape, and Kick questionnaire    Fear of Current or Ex-Partner: No    Emotionally Abused: No    Physically Abused: No    Sexually Abused: No   Family History  Problem Relation Age of Onset   Breast cancer Mother 73       3 different times   Pancreatic cancer Mother    Emphysema Father    Healthy Brother    Breast cancer Cousin    Valvular heart disease Son    Obesity Daughter    Colon cancer Neg Hx    Esophageal cancer Neg Hx    Stomach cancer Neg Hx    Inflammatory bowel disease Neg Hx    Liver disease Neg Hx    Rectal cancer Neg Hx    I have reviewed her medical,  social, and family history in detail and updated the electronic medical record as necessary.    PHYSICAL EXAMINATION  BP 110/68   Pulse 84   Ht 5' (1.524 m)   Wt 180 lb (81.6 kg)   BMI 35.15 kg/m  Wt Readings from Last 3 Encounters:  04/06/23 180 lb (81.6 kg)  04/04/23 188 lb 0.8 oz (85.3 kg)  03/17/23 191 lb 4 oz (86.8 kg)  GEN: NAD, appears older than stated age, appears chronically ill but is nontoxic (looks more ill then prior visit however), accompanied by son PSYCH: Cooperative, without pressured speech EYE: Conjunctivae pale-pink ENT: MMM, Bally in place CV: Nontachycardic RESP: Audible wheezing present GI: NABS, soft, rounded, obese, minimal TTP in LLQ without rebound or guarding (better than previous clinic visit) MSK/EXT: Bilateral lower extremity edema present SKIN: No jaundice NEURO:  Alert & Oriented x 3, no focal deficits   REVIEW OF DATA  I reviewed the following data at the time of this encounter:  GI Procedures and Studies  No new procedures to review  Laboratory Studies  Reviewed those in epic and care everywhere  Imaging Studies  October 24 CT abdomen pelvis with contrast IMPRESSION: 1. Interval decrease in free fluid seen previously anterior to the distal sigmoid colon and in the cul-de-sac. 2. Loss of fat plane between the vaginal cuff and the distal sigmoid colon. As before, vaginal vault is filled with gas and  high density material, features compatible with reported clinical history of colovaginal fistula. 3.  Aortic Atherosclerosis (ICD10-I70.0).  October 19 CT abdomen pelvis with contrast IMPRESSION: Persistent changes consistent with a colovaginal fistula. Persistent acute sigmoid diverticulitis is identified as well. No discrete abscess is noted. Stable 5 mm nodule in the right lower lobe. No follow-up needed if patient is low-risk. Non-contrast chest CT can be considered in 12 months if patient is high-risk. This recommendation follows  the consensus statement: Guidelines for Management of Incidental Pulmonary Nodules Detected on CT Images: From the Fleischner Society 2017; Radiology 2017; 284:228-243.   ASSESSMENT  Ms. Chew is a 77 y.o. 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 fib (status post prior AVN ablation and PPM on anticoagulation), hypertension, gout, CRI, iron deficiency, family history of pancreas cancer (mother), colon polyps (found on CT colonography in 2021), diverticulitis (complicated with colovaginal fistula).  The patient is seen today for evaluation and management of:  1. Diverticulitis   2. Colovaginal fistula   3. History of colon polyps   4. Chronic constipation   5. Pill dysphagia    The patient is hemodynamically stable.  Clinically however she continues to be in a very difficult situation with complicated diverticulitis and a most recent hospitalization with recurrent diverticulitis and potentially diverticular hemorrhage.  Thankfully she is improved and now discharged.  Completion of antibiotic therapy is reasonable.  We discussed that it is okay for her to not be on a low residue diet, if she is feeling that she can advance her diet somewhat.  She would not benefit however from being on a very high fiber diet currently.  For her chronic constipation, I did improve with Trulance samples and we will plan to give her a prescription that she can use on a daily or every other day basis to help keep things moving and hopefully prevent her from having further issues.  I plan to still move forward with her high risk colonoscopy to ensure that we are not missing a colon cancer masquerading as recurrent diverticulitis as a reason for her fistulization into her vagina and also to attempt to remove the previously noted polyps that were found on her virtual colonoscopy.  She and her son understand that her anesthesia risks  are high.  The risks and benefits of endoscopic evaluation were discussed with the patient; these include but are not limited to the risk of perforation, infection, bleeding, missed lesions, lack of diagnosis, severe illness requiring hospitalization, as well as anesthesia and sedation related illnesses.  The patient and/or family is agreeable to proceed.  We discussed consideration of an upper endoscopy for her issues of pill dysphagia, but she understands and is very worried about an upper endoscopy in the setting of her tracheomalacia and her respiratory status.  As such we offered the consideration of a barium swallow but as she feels she is not having true solid food dysphagia or liquid dysphagia, she does not want to pursue this currently.  Will hold off on the upper endoscopy.  I will get a CT scan 1-1/2 to 1-week before her scheduled colonoscopy to ensure that there is no complicating factors for me to move forward with her high risk colonoscopy in the hospital-based setting.  We will get approval for anticoagulation hold.  Will also ask our cardiology and pulmonary colleagues to see if she is as optimized as possible before her upcoming colonoscopy.  Appreciate surgery evaluating the patient for consideration of treatment for her colovaginal fistula.  Multiple discussions today by the patient and patient's son as to what type of interventions could be offered by surgery and I told them both that this is more specific for our surgeons to discuss with them. All patient questions were answered to the best of my ability, and the patient agrees to the aforementioned plan of action with follow-up as indicated.    PLAN  Laboratories as outlined below within 1-1/2 to 1-week before planned colonoscopy Proceed with scheduling CT abdomen pelvis with contrast to be performed 1-1/2 to 1-week before planned colonoscopy to ensure safety of moving forward with procedure Proceed with scheduling colonoscopy Holding on  EGD even though she has indication of prior iron deficiency because she feels she is high risk for complications and endoscopy Appreciate pulmonary and cardiology optimization prior to endoscopic evaluation and also for our surgeons as they move forward with potential interventions in the near future  Trulance 3 mg daily   Orders Placed This Encounter  Procedures   CT ABDOMEN PELVIS W CONTRAST   CBC   Comp Met (CMET)   Sedimentation rate   C-reactive protein    New Prescriptions   PLECANATIDE (TRULANCE) 3 MG TABS    Take 1 tablet (3 mg total) by mouth daily.   Modified Medications   No medications on file    Planned Follow Up No follow-ups on file.   Total Time in Face-to-Face and in Coordination of Care for patient including independent/personal interpretation/review of prior testing, medical history, examination, medication adjustment, communicating results with the patient directly, and documentation within the EHR is 40 minutes (30 minutes with the patient and son and >10 minutes on note completion).   Corliss Parish, MD [facility] Office # [phone]

## 2023-04-06 NOTE — Telephone Encounter (Signed)
Patient will be informed at visit today, okay to hold Xarelto 1 day prior to procedure per Cardiology.

## 2023-04-07 ENCOUNTER — Ambulatory Visit (HOSPITAL_BASED_OUTPATIENT_CLINIC_OR_DEPARTMENT_OTHER): Payer: Medicare Other | Admitting: Pulmonary Disease

## 2023-04-07 ENCOUNTER — Telehealth: Payer: Self-pay

## 2023-04-07 ENCOUNTER — Telehealth: Payer: Self-pay | Admitting: Pharmacy Technician

## 2023-04-07 ENCOUNTER — Encounter (HOSPITAL_BASED_OUTPATIENT_CLINIC_OR_DEPARTMENT_OTHER): Payer: Self-pay | Admitting: Pulmonary Disease

## 2023-04-07 VITALS — BP 116/78 | HR 89 | Resp 16 | Ht 60.0 in | Wt 180.6 lb

## 2023-04-07 DIAGNOSIS — J9611 Chronic respiratory failure with hypoxia: Secondary | ICD-10-CM

## 2023-04-07 DIAGNOSIS — J4489 Other specified chronic obstructive pulmonary disease: Secondary | ICD-10-CM | POA: Diagnosis not present

## 2023-04-07 DIAGNOSIS — J398 Other specified diseases of upper respiratory tract: Secondary | ICD-10-CM | POA: Diagnosis not present

## 2023-04-07 NOTE — Telephone Encounter (Signed)
Pharmacy Patient Advocate Encounter   Received notification from CoverMyMeds that prior authorization for TRULANCE 3MG  is required/requested.   Insurance verification completed.   The patient is insured through [employer] .   Per test claim: PA required; PA submitted to above mentioned insurance via CoverMyMeds Key/confirmation #/EOC [id number] Status is pending

## 2023-04-07 NOTE — Assessment & Plan Note (Signed)
Continue 3 to 4 L of oxygen at all times

## 2023-04-07 NOTE — Progress Notes (Signed)
Subjective:    Patient ID: Kathleen [name], female    DOB: November 08, 1945, 77 y.o.   MRN: [id number]  HPI  77 yo woman, ex-smoker with asthma/COPD overlap syndrome, tracheobronchomalacia and chronic hypoxic respiratory failure  -She uses 3 L pulse at rest and 4 L on ambulation   Asthma onset in 20s She was previously followed at [facility], evaluated by Dr. Renaee Munda at [facility] interventional pulmonary and felt not to be a candidate for stent placement for tracheobronchomalacia     PMH - BOOP in 2013 HFrEF,  persistent AF s/p AVN ablation and pacemaker implant 2017 (has failed upgrade to BiV), on Tikosyn -left nasal bleeds  Desensitized to penicillin diverticulitis with colovaginal fistula    Hosp 11/2020 for pneumonia, respiratory failure hospitalized 03/2022 for sepsis  related to pneumonia, CTA was negative for PE or acute airspace disease. admitted from 04/2022 for acute on chronic respiratory failure secondary to RSV pneumonia/Boop? >> prolonged steroid taper upon discharge.   Meds -She was switched from an inhaler regimen in 2022 to budesonide/Brovana and Yupelri combination -did not tolerate budesonide/Brovana due to joint pains - did not tolerate symbicort  Chief Complaint  Patient presents with   Pre-op Exam     [facility]  to perform a endoscopic procedure on patient.  Patient needs surgery clearance. Surgery is 05/03/23.    She was hospitalized 9/16 for diverticulitis and assessed by my partner during that hospitalization. She is followed by outpatient palliative care She was admitted 10/18 to 10/27 with acute diverticulitis and persisting colovaginal fistula  She had self-limited rectal bleeding felt to be a likely diverticular bleed  She was treated with 2 weeks of antibiotics, reviewed GI consultation on follow-up visit, high risk colonoscopy is planned for 11/25 after CT abdomen She would like my opinion about having surgery. She is now in a nursing facility for the last 2 and half  years and feels like she has been exposed to mold  She takes torsemide 60 mg daily and wonders if this is a high dose.  She is also on a prednisone taper since her most recent hospitalization. Accompanied by her daughter-in-law today who corroborates history  Significant tests/ events reviewed BiPAP titration study 01/2021 12/8 cm   ABG 03/2022 7.3 4/33/87 04/2022 CT sinuses diffuse paranasal sinus disease  03/2022 CTA chest neg  08/2021 CT chest without contrast emphysema, stable nodules   HRCT 01/2021 emphysema, tracheobronchomalacia , bland scarring both bases , new atelectasis/consolidation of the lingula with scattered groundglass in left upper lobe   01/2020  With exertion her lowest oxygen saturation was 92% on room air. Her peak HR was 103. She walked a total of 63m which was 25% predicted     Kathleen [name] 01/2021 severe airway obstruction, ratio 59, FEV1 0.95/48%, FVC 1.61/64%  Review of Systems neg for any significant sore throat, dysphagia, itching, sneezing, nasal congestion or excess/ purulent secretions, fever, chills, sweats, unintended wt loss, pleuritic or exertional cp, hempoptysis, orthopnea pnd or change in chronic leg swelling. Also denies presyncope, palpitations, heartburn, abdominal pain, nausea, vomiting, diarrhea or change in bowel or urinary habits, dysuria,hematuria, rash, arthralgias, visual complaints, headache, numbness weakness or ataxia.     Objective:   Physical Exam  Gen. Pleasant, obese, in no distress ENT - no lesions, no post nasal drip Neck: No JVD, no thyromegaly, no carotid bruits Lungs: no use of accessory muscles, no dullness to percussion, decreased without rales or rhonchi  Cardiovascular: Rhythm regular, heart sounds  normal, no murmurs or gallops, no peripheral edema Musculoskeletal: No deformities, no cyanosis or clubbing , no tremors       Assessment & Plan:    Preop evaluation -she would be high risk for surgery from a pulmonary  standpoint and at risk for postop pulmonary complications.  We discussed risk for colonoscopy as a diagnostic procedure would be lower risk.  Certainly she would be very high risk for laparotomy but of laparoscopic consistent procedures considered for colovaginal fistula repair, then she should discuss with the surgeon  Major Pulmonary risks identified in the multifactorial risk analysis are but not limited to a) pneumonia; b) recurrent intubation risk; c) prolonged or recurrent acute respiratory failure needing mechanical ventilation; d) prolonged hospitalization; e) DVT/Pulmonary embolism; f) Acute Pulmonary edema   Recommend 1. Short duration of surgery as much as possible and avoid paralytic if possible 2. Recovery in step down or ICU with Pulmonary consultation if indicated  3. DVT prophylaxis as indicated  4. Aggressive pulmonary toilet with o2, bronchodilatation, and incentive spirometry and early ambulation 5. May use BiPAP in post op settings if needed.    [place] - Prolonged mech ventilation risk [place] Postperative Pulmonary Risk Score - for mech ventilation dependence >48h USAA, Ann Surg 2000, major non-cardiac surgery) Comment Score  Type of surgery - abd ao aneurysm (27), thoracic (21), neurosurgery / upper abdominal / vascular (21), neck (11)  21 21  Emergency Surgery - (11)   0  ALbumin < 3 or poor nutritional state - (9)   0  BUN > 30 -  (8)   0  Partial or completely dependent functional status - (7)  7 0  COPD -  (6)  6 6  Age - 60 to 69 (4), > 70  (6)  6 4  TOTAL   31  Risk Stratifcation scores  - < 10 (0.5%), 11-19 (1.8%), 20-27 (4.2%), 28-40 (10.1%), >40 (26.6%)  40 10.1%

## 2023-04-07 NOTE — Transitions of Care (Post Inpatient/ED Visit) (Signed)
04/07/2023  Name: Kathleen [name] MRN: [id number] DOB: 1946-03-06  Today's TOC FU Call Status: Today's TOC FU Call Status:: Successful TOC FU Call Completed TOC FU Call Complete Date: 04/06/23 Patient's Name and Date of Birth confirmed.  Transition Care Management Follow-up Telephone Call Date of Discharge: 04/04/23 Discharge Facility: [facility]) Type of Discharge: Inpatient Admission Primary Inpatient Discharge Diagnosis:: COPD exac, acute on chronic resp failure; diverticulitis complicated by vaginal fistula How have you been since you were released from the hospital?: Better (" I think I am much better - I really do".  I am sitting in my car waiting to do curbside voting, would you please call me back tomorrow, 10/30.) Any questions or concerns?: No  Items Reviewed:    Medications Reviewed Today: Medications Reviewed Today     Reviewed by Wyline Mood, RN (Case Manager) on 04/06/23 at 1601  Med List Status: <None>   Medication Order Taking? Sig Documenting Provider Last Dose Status Informant  acetaminophen (TYLENOL) 650 MG CR tablet [id number] No Take 1,300 mg by mouth every 8 (eight) hours as needed for pain. [provider] Taking Active Self, Pharmacy Records  azelastine (ASTELIN) 0.1 % nasal spray [id number] No Place 2 sprays into both nostrils 2 (two) times daily. Oretha Milch, MD Taking Active Self, Pharmacy Records  chlorpheniramine-HYDROcodone (TUSSIONEX) 10-8 MG/5ML [id number] No Take 5 mLs by mouth every 12 (twelve) hours as needed for cough. Almon Hercules, MD Taking Active   Cholecalciferol (VITAMIN D3) 50 MCG (2000 UT) TABS [id number] No Take 2,000 Units by mouth at bedtime. [provider] Taking Active Self, Pharmacy Records  cyanocobalamin (VITAMIN B12) 1000 MCG/ML injection [id number] No Inject 1 mL (1,000 mcg total) into the muscle every 30 (thirty) days. [name], MD Taking Active Self, Pharmacy Records  docusate sodium (COLACE)  100 MG capsule [id number] No Take 100 mg by mouth 2 (two) times daily. [provider] Taking Active Self, Pharmacy Records  dofetilide (TIKOSYN) 250 MCG capsule [id number] No Take 1 capsule (250 mcg total) by mouth in the morning and at bedtime. Fenton, [place] R, PA Taking Active Self, Pharmacy Records  doxycycline (VIBRA-TABS) 100 MG tablet [id number] No Take 1 tablet (100 mg total) by mouth every 12 (twelve) hours for 7 days. Almon Hercules, MD Taking Active   febuxostat (ULORIC) 40 MG tablet [id number] No TAKE 1 TABLET(40 MG) BY MOUTH DAILY Rice, Jamesetta Orleans, MD Taking Active   fluticasone furoate-vilanterol (BREO ELLIPTA) 100-25 MCG/ACT AEPB [id number] No INHALE 1 PUFF INTO THE LUNGS DAILY Oretha Milch, MD Taking Active Self, Pharmacy Records  gabapentin (NEURONTIN) 100 MG capsule [id number] No Take 2 capsules (200 mg total) by mouth at bedtime. Windell Norfolk, MD Taking Active Self, Pharmacy Records  guaiFENesin (MUCINEX) 600 MG 12 hr tablet [id number] No Take 1 tablet (600 mg total) by mouth 2 (two) times daily for 5 days. Almon Hercules, MD Taking Active   levalbuterol [facility] Aus) 0.63 MG/3ML nebulizer solution [id number] No Take 3 mLs (0.63 mg total) by nebulization every 6 (six) hours as needed for wheezing or shortness of breath. DX J44.89 J96.21 Oretha Milch, MD Taking Active Self, Pharmacy Records  Magnesium 500 MG TABS [id number] No Take 500 mg by mouth at bedtime. [provider] Taking Active Self, Pharmacy Records  metolazone (ZAROXOLYN) 2.5 MG tablet [id number] No Take 1 tablet (2.5 mg total) by mouth once a week. MUST TAKE OF POTASSIUM WITH THIS Tonye Becket D, NP Taking Active  Self, Pharmacy Records           Med Note Patience Musca   NWG Mar 27, 2023 11:14 AM) thursdays  metroNIDAZOLE (FLAGYL) 500 MG tablet [id number] No Take 1 tablet (500 mg total) by mouth every 12 (twelve) hours for 7 days. Almon Hercules, MD Taking Active   Na Sulfate-K Sulfate-Mg Sulf (SUPREP  BOWEL PREP KIT) 17.5-3.13-1.6 GM/177ML SOLN [id number] No Take 1 kit by mouth as directed. For colonoscopy prep Mansouraty, Netty Starring., MD Taking Active Self, Pharmacy Records           Med Note Patience Musca   BMW Mar 27, 2023 11:11 AM) Not taken yet, colonoscopy is in november  nystatin (MYCOSTATIN) 100000 UNIT/ML suspension [id number] No Take 5 mLs (500,000 Units total) by mouth 4 (four) times daily. Almon Hercules, MD Taking Active   OXYGEN [id number] No Inhale 5 L into the lungs continuous. Use with resmed ventilator [provider] Taking Active Self, Pharmacy Records  pantoprazole (PROTONIX) 40 MG tablet [id number] No Take 1 tablet (40 mg total) by mouth daily. Almon Hercules, MD Taking Active   Plecanatide (TRULANCE) 3 MG TABS [id number]  Take 1 tablet (3 mg total) by mouth daily. Mansouraty, Netty Starring., MD  Active   potassium chloride SA (KLOR-CON M) 20 MEQ tablet [id number] No Take two tablets daily for the next two days, then take 2 tablets (40 mEq total) with weekly Metolazone dosage. Almon Hercules, MD Taking Active   predniSONE (DELTASONE) 10 MG tablet [id number] No Take 4 tablets (40 mg total) by mouth daily for 2 days, THEN 3 tablets (30 mg total) daily for 2 days, THEN 3 tablets (30 mg total) daily for 2 days, THEN 2 tablets (20 mg total) daily for 2 days, THEN 1 tablet (10 mg total) daily for 2 days, THEN 0.5 tablets (5 mg total) daily for 2 days. Almon Hercules, MD Taking Active   revefenacin [facility]) 175 MCG/3ML nebulizer solution [id number] No Take 175 mcg by nebulization daily. [provider] Taking Active Self, Pharmacy Records  rivaroxaban (XARELTO) 20 MG TABS tablet [id number] No Take 1 tablet (20 mg total) by mouth daily with supper. Bensimhon, [name], MD Taking Active Self, Pharmacy Records  torsemide [facility]) 20 MG tablet [id number] No Take 3 tablets (60 mg total) by mouth daily.  Patient taking differently: Take 40 mg by mouth daily.   Sherald Hess, NP  Taking Active Self, Pharmacy Records           Med Note Patience Musca   DVV Mar 27, 2023 11:13 AM) Pt states she had 60mg  on Thursday 10/17            Home Care and Equipment/Supplies:    Functional Questionnaire:    Follow up appointments reviewed:

## 2023-04-07 NOTE — Transitions of Care (Post Inpatient/ED Visit) (Signed)
04/07/2023  Name: Kathleen [name] MRN: [id number] DOB: June 23, 1945  Today's TOC FU Call Status: Today's TOC FU Call Status:: Successful TOC FU Call Completed TOC FU Call Complete Date: 04/07/23 Patient's Name and Date of Birth confirmed.  Transition Care Management Follow-up Telephone Call Date of Discharge: 04/04/23 Discharge Facility: [facility]) Type of Discharge: Inpatient Admission Primary Inpatient Discharge Diagnosis:: COPD exac, acute on chronic resp failure; diverticulitis complicated by vaginal fistula How have you been since you were released from the hospital?: Worse ("I am not feeling as well today as I was yesterday - feel more short of breath and weak; I have been doing too much around the house and need to rest") Any questions or concerns?: No  Items Reviewed: Did you receive and understand the discharge instructions provided?: Yes Medications obtained,verified, and reconciled?: Yes (Medications Reviewed) Any new allergies since your discharge?: No Dietary orders reviewed?: Yes Type of Diet Ordered:: low salt, heart healthy Do you have support at home?: Yes People in Home: alone Name of Support/Comfort Primary Source: Has a supportive son, [name] and DIL who provide assistance as needed  Medications Reviewed Today: Medications Reviewed Today     Reviewed by Wyline Mood, RN (Case Manager) on 04/07/23 at 1527  Med List Status: <None>   Medication Order Taking? Sig Documenting Provider Last Dose Status Informant  acetaminophen (TYLENOL) 650 MG CR tablet [id number] Yes Take 1,300 mg by mouth every 8 (eight) hours as needed for pain. [provider] Taking Active Self, Pharmacy Records  azelastine (ASTELIN) 0.1 % nasal spray [id number] Yes Place 2 sprays into both nostrils 2 (two) times daily. Oretha Milch, MD Taking Active Self, Pharmacy Records  chlorpheniramine-HYDROcodone [facility]) 10-8 MG/5ML [id number] Yes Take 5 mLs by mouth every 12 (twelve)  hours as needed for cough. Almon Hercules, MD Taking Active   Cholecalciferol (VITAMIN D3) 50 MCG (2000 UT) TABS [id number] Yes Take 2,000 Units by mouth at bedtime. [provider] Taking Active Self, Pharmacy Records  cyanocobalamin (VITAMIN B12) 1000 MCG/ML injection [id number] Yes Inject 1 mL (1,000 mcg total) into the muscle every 30 (thirty) days. [name], MD Taking Active Self, Pharmacy Records  docusate sodium (COLACE) 100 MG capsule [id number] Yes Take 100 mg by mouth 2 (two) times daily. [provider] Taking Active Self, Pharmacy Records  dofetilide [facility]) 250 MCG capsule [id number] Yes Take 1 capsule (250 mcg total) by mouth in the morning and at bedtime. Fenton, [employer] R, PA Taking Active Self, Pharmacy Records  doxycycline (VIBRA-TABS) 100 MG tablet [id number] Yes Take 1 tablet (100 mg total) by mouth every 12 (twelve) hours for 7 days. Almon Hercules, MD Taking Active   febuxostat (ULORIC) 40 MG tablet [id number] Yes TAKE 1 TABLET(40 MG) BY MOUTH DAILY Rice, Jamesetta Orleans, MD Taking Active   fluticasone furoate-vilanterol (BREO ELLIPTA) 100-25 MCG/ACT AEPB [id number] Yes INHALE 1 PUFF INTO THE LUNGS DAILY Oretha Milch, MD Taking Active Self, Pharmacy Records  gabapentin (NEURONTIN) 100 MG capsule [id number] Yes Take 2 capsules (200 mg total) by mouth at bedtime. Windell Norfolk, MD Taking Active Self, Pharmacy Records  guaiFENesin (MUCINEX) 600 MG 12 hr tablet [id number] Yes Take 1 tablet (600 mg total) by mouth 2 (two) times daily for 5 days. Almon Hercules, MD Taking Active   levalbuterol Pauline Aus) 0.63 MG/3ML nebulizer solution [id number] Yes Take 3 mLs (0.63 mg total) by nebulization every 6 (six) hours as needed for wheezing or shortness of breath. DX J44.89 J96.21  Oretha Milch, MD Taking Active Self, Pharmacy Records  Magnesium 500 MG TABS [id number] Yes Take 500 mg by mouth at bedtime. [provider] Taking Active Self, Pharmacy Records  metolazone  (ZAROXOLYN) 2.5 MG tablet [id number] Yes Take 1 tablet (2.5 mg total) by mouth once a week. MUST TAKE OF POTASSIUM WITH THIS Sherald Hess, NP Taking Active Self, Pharmacy Records           Med Note Patience Musca   SEG Mar 27, 2023 11:14 AM) thursdays  metroNIDAZOLE (FLAGYL) 500 MG tablet [id number] Yes Take 1 tablet (500 mg total) by mouth every 12 (twelve) hours for 7 days. Almon Hercules, MD Taking Active   Na Sulfate-K Sulfate-Mg Sulf (SUPREP BOWEL PREP KIT) 17.5-3.13-1.6 GM/177ML SOLN [id number] No Take 1 kit by mouth as directed. For colonoscopy prep  Patient not taking: Reported on 04/07/2023   Mansouraty, Netty Starring., MD Not Taking Active Self, Pharmacy Records           Med Note Patience Musca   SWN Mar 27, 2023 11:11 AM) Not taken yet, colonoscopy is in november  nystatin (MYCOSTATIN) 100000 UNIT/ML suspension [id number] Yes Take 5 mLs (500,000 Units total) by mouth 4 (four) times daily. Almon Hercules, MD Taking Active   OXYGEN [id number] Yes Inhale 5 L into the lungs continuous. Use with resmed ventilator [provider] Taking Active Self, Pharmacy Records  pantoprazole (PROTONIX) 40 MG tablet [id number] Yes Take 1 tablet (40 mg total) by mouth daily. Almon Hercules, MD Taking Active            Med Note Lacie Draft Apr 07, 2023  3:25 PM) Discharge instructions indicated for patient to start on 04/04/23.  Patient confirms she has started the medication.  Plecanatide (TRULANCE) 3 MG TABS [id number]  Take 1 tablet (3 mg total) by mouth daily. Mansouraty, Netty Starring., MD  Active   potassium chloride SA (KLOR-CON M) 20 MEQ tablet [id number] Yes Take two tablets daily for the next two days, then take 2 tablets (40 mEq total) with weekly Metolazone dosage. Almon Hercules, MD Taking Active            Med Note Lacie Draft Apr 07, 2023  3:27 PM) Patient indicates that she will be given new potassium orders when the CHF clinic provider visits her on  04/08/23.  predniSONE (DELTASONE) 10 MG tablet [id number] Yes Take 4 tablets (40 mg total) by mouth daily for 2 days, THEN 3 tablets (30 mg total) daily for 2 days, THEN 3 tablets (30 mg total) daily for 2 days, THEN 2 tablets (20 mg total) daily for 2 days, THEN 1 tablet (10 mg total) daily for 2 days, THEN 0.5 tablets (5 mg total) daily for 2 days. Almon Hercules, MD Taking Active   revefenacin [facility]) 175 MCG/3ML nebulizer solution [id number] Yes Take 175 mcg by nebulization daily. [provider] Taking Active Self, Pharmacy Records  rivaroxaban (XARELTO) 20 MG TABS tablet [id number] Yes Take 1 tablet (20 mg total) by mouth daily with supper. Bensimhon, [name], MD Taking Active Self, Pharmacy Records  torsemide [facility]) 20 MG tablet [id number] Yes Take 3 tablets (60 mg total) by mouth daily.  Patient taking differently: Take 40 mg by mouth daily.   Sherald Hess, NP Taking Active Self, Pharmacy Records           Med Note Patience Musca   PJA Mar 27, 2023 11:13 AM) Pt states she had 60mg  on Thursday 10/17            Home Care and Equipment/Supplies: Were Home Health Services Ordered?: No Any new equipment or medical supplies ordered?: No  Functional Questionnaire: Do you need assistance with bathing/showering or dressing?: No Do you need assistance with meal preparation?: No Do you need assistance with eating?: No Do you have difficulty maintaining continence: Yes (due to fistula - wearing adult diapers) Do you need assistance with getting out of bed/getting out of a chair/moving?: No Do you have difficulty managing or taking your medications?: No  Follow up appointments reviewed: PCP Follow-up appointment confirmed?: No (Patient has several appointments over the next week with her specialists and decline need to see PCP as this time) MD Provider Line Number:[phone] Given: No Specialist Hospital Follow-up appointment confirmed?: Yes Date of Specialist follow-up  appointment?: 04/06/23 Follow-Up Specialty Provider:: 10/29 GI,  Cardiologist on 10/28, Pulm on 10/30, and provider from [facility] Clinic on 10/31 Do you need transportation to your follow-up appointment?: No Do you understand care options if your condition(s) worsen?: Yes-patient verbalized understanding  SDOH Interventions Today    Flowsheet Row Most Recent Value  SDOH Interventions   Food Insecurity Interventions Intervention Not Indicated  Transportation Interventions Intervention Not Indicated

## 2023-04-07 NOTE — Patient Instructions (Addendum)
You are at high risk for colonoscopy and abdominal surgery  Continue Breo and Yupelri Continue tapering steroids to off

## 2023-04-07 NOTE — Assessment & Plan Note (Signed)
Placed on BiPAP during sleep and this seems to be helping.  Emphasized compliance.  Settings are 12/8

## 2023-04-07 NOTE — Assessment & Plan Note (Signed)
Continue current regimen of Breo and Yupelri. Dysphagia and has worked well for her. She can use Xopenex nebs as needed for rescue. She will continue tapering off the prednisone that she was given during hospitalization

## 2023-04-08 ENCOUNTER — Other Ambulatory Visit (HOSPITAL_COMMUNITY): Payer: Self-pay

## 2023-04-08 ENCOUNTER — Ambulatory Visit: Payer: Self-pay | Admitting: Licensed Clinical Social Worker

## 2023-04-08 NOTE — Telephone Encounter (Signed)
OV note from 04/07/23 was faxed to [facility]

## 2023-04-08 NOTE — Progress Notes (Signed)
Paramedicine Encounter    Patient ID: Kathleen [name], female    DOB: [birth year], 77 y.o.   MRN: [id number]   Complaints-dizziness today, some sob and feeling bloated  Edema-no   Compliance with meds-yes  Pill box filled-she does her own -not filled today though  If so, by whom-herself   Refills needed-  Unsure of the metolazone usage--one note says to take if needed then her med list says weekly-talked to chantel at [facility]F clinic and to confirm potassium weekly and weekly metolazone     SOCIAL/MEDICAL BARRIERS:  PHARMACY USED -[facility] -her family has to p/u meds  Possibly wants to [facility] to [facility] to have them sent out   MED ISSUES:  AFFORDABILITY-xarelto?   PT ASSIST APPS NEEDED-have xarelto   PCP-dr jones    INSURANCE-yes  SOURCE OF INCOME-[id number] after deductions  She does have   TRANSPORTATION-son and DIL take her-the driver of the van of the facility quit-tues-thur are the only avail days   FOOD INSECURITIES/NEEDS-son helps with shopping/ordering -she does enjoy cooking but right now too weak to stand to prep food at this time but hoping to improve and get stronger   FOOD STAMPS-no  REVIEWED DIET/FLUID/SALT RESTRICTIONS-she is aware   RENT/OWN HOME ISSUES-no right now--her rent is increasing to 7.5% each year   SOCIAL SUPPORT-son here in [place] that helps   SAFETY/DOMESTIC ISSUES-no   SUBSTANCE ABUSE-no   DAILY WEIGHTS-yes -she is aware of why to weigh   EDUCATE ON DISEASE PROCESS/SYMPTOMS/PURPOSES OF MEDS-she is fully aware of her medical conditions  Initial home visit from referral. She is recently home from hosp.  She has been living independent facility for past 2.94yrs.  She had pneumonia and was almost septic.  She reported continuous cough and sneezing since June. She was telling me that she had 2 water leaks above her apt and had mold in the drywall and all but they have done the cleaning of all her things so she has  sent out for a mold test and expecting results in 2-3 days.   She did miss her appoint due to her being in hosp-  She is interested in access [place] but unsure if she would be eligible considering where she lives at, but they having issues with finding replacement. Will ask jenna about this.  Pt is on steroids right now and is feeling very on edge-   She was taken off losartan due to cough- will have heart doc address this at her next appointment.   She is getting HHPT today.   She does c/o increased sob upon moving around too much and feels some bloated. She will take metolazone today with the of potassium with it.  Pt denies c/p.  Got her set up for 11/11 for sooner appoint in clinic, sent message to jenna to help with a ride for her.    BP (!) 118/0   Pulse 78   Resp 18   Wt 178 lb (80.7 kg)   SpO2 99%   BMI 34.76 kg/m  Weight yesterday-180 Last visit weight-Monday-176  Patient Care Team: [name], MD as PCP - General (Internal Medicine) Bensimhon, [name], MD as PCP - Advanced Heart Failure (Cardiology) Regan Lemming, MD as PCP - Electrophysiology (Cardiology) Lurline Idol, FNP as Nurse Practitioner (Hospice and Palliative Medicine) Colletta Maryland, RN as [facility] Care Management  Patient Active Problem List   Diagnosis Date Noted   Pill dysphagia  04/06/2023   COPD exacerbation (HCC) 03/26/2023   Absolute anemia 03/17/2023   LLQ pain 03/17/2023   Chronic nausea 03/17/2023   Hypokalemia 02/23/2023   Diverticulitis 02/22/2023   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   Carpal tunnel syndrome, right upper limb 01/27/2023   Acute exacerbation of chronic obstructive pulmonary disease (COPD) (HCC) 01/13/2023   Dysuria 11/26/2022   Hypercoagulable state due to persistent atrial fibrillation (HCC) 11/18/2022   Acute on chronic heart failure with preserved ejection fraction (HCC) 10/26/2022   COPD with acute exacerbation (HCC) 10/09/2022    Pain in left shoulder 07/23/2022   Chronic obstructive pulmonary disease (HCC) 06/28/2022   Palliative care encounter 06/28/2022   Post-viral cough syndrome 06/02/2022   Paranasal sinus disease 05/19/2022   Heart failure with reduced ejection fraction (HCC) 05/14/2022   Pneumonia 04/29/2022   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   Depression, major, single episode, moderate (HCC) 04/27/2022   Non-STEMI (non-ST elevated myocardial infarction) (HCC) 03/18/2022   Morbid obesity (HCC) 03/18/2022   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   Low TSH level 12/02/2021   Acute idiopathic gout of left hand 11/19/2021   Acquired hypothyroidism 11/12/2021   Encounter for palliative care involving management of pain 11/12/2021   Post concussive syndrome 11/11/2021   Chronic left-sided low back pain 11/04/2021   Gout 10/01/2021   Hyperlipidemia with target LDL less than 100 08/07/2021   Chronic anticoagulation 07/07/2021   History of colon polyps 07/07/2021   Stage 3a chronic kidney disease (HCC) 05/23/2021   Dietary iron deficiency 05/23/2021   Age-related osteoporosis without current pathological fracture 04/22/2021   Type 2 diabetes mellitus with diabetic neuropathy, without long-term current use of insulin (HCC) 04/22/2021   Sleep apnea 01/22/2021   Statin intolerance 10/12/2019   Constipation    Statin myopathy 02/24/2019   Chronic systolic heart failure (HCC) 04/06/2018   HTN (hypertension) 04/06/2018   Atrial fibrillation (HCC) 04/06/2018   Chronic respiratory failure with hypoxia (HCC) 03/28/2018   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 12/02/2015   Malnutrition of moderate degree 09/11/2015    Current Outpatient Medications:    acetaminophen (TYLENOL) 650 MG CR tablet, Take 1,300 mg by mouth every 8 (eight) hours as needed for pain., Disp: , Rfl:    azelastine (ASTELIN) 0.1 % nasal spray, Place 2 sprays into both nostrils 2 (two) times daily., Disp: 90 mL, Rfl: 0    Cholecalciferol (VITAMIN D3) 50 MCG (2000 UT) TABS, Take 2,000 Units by mouth at bedtime., Disp: , Rfl:    cyanocobalamin (VITAMIN B12) 1000 MCG/ML injection, Inject 1 mL (1,000 mcg total) into the muscle every 30 (thirty) days., Disp: 10 mL, Rfl: 0   docusate sodium (COLACE) 100 MG capsule, Take 100 mg by mouth 2 (two) times daily., Disp: , Rfl:    dofetilide (TIKOSYN) 250 MCG capsule, Take 1 capsule (250 mcg total) by mouth in the morning and at bedtime., Disp: 60 capsule, Rfl: 6   doxycycline (VIBRA-TABS) 100 MG tablet, Take 1 tablet (100 mg total) by mouth every 12 (twelve) hours for 7 days., Disp: 14 tablet, Rfl: 0   febuxostat (ULORIC) 40 MG tablet, TAKE 1 TABLET(40 MG) BY MOUTH DAILY, Disp: 90 tablet, Rfl: 0   fluticasone furoate-vilanterol (BREO ELLIPTA) 100-25 MCG/ACT AEPB, INHALE 1 PUFF INTO THE LUNGS DAILY, Disp: 60 each, Rfl: 11   gabapentin (NEURONTIN) 100 MG capsule, Take 2 capsules (200 mg total) by mouth at bedtime., Disp: 60 capsule, Rfl: 6   guaiFENesin (MUCINEX) 600 MG 12 hr tablet, Take  1 tablet (600 mg total) by mouth 2 (two) times daily for 5 days., Disp: 10 tablet, Rfl: 0   levalbuterol (XOPENEX) 0.63 MG/3ML nebulizer solution, Take 3 mLs (0.63 mg total) by nebulization every 6 (six) hours as needed for wheezing or shortness of breath. DX J44.89 J96.21, Disp: 3 mL, Rfl: 12   Magnesium 500 MG TABS, Take 500 mg by mouth at bedtime., Disp: , Rfl:    metolazone (ZAROXOLYN) 2.5 MG tablet, Take 1 tablet (2.5 mg total) by mouth once a week. MUST TAKE OF POTASSIUM WITH THIS, Disp: 12 tablet, Rfl: 0   metroNIDAZOLE (FLAGYL) 500 MG tablet, Take 1 tablet (500 mg total) by mouth every 12 (twelve) hours for 7 days., Disp: 14 tablet, Rfl: 0   nystatin (MYCOSTATIN) 100000 UNIT/ML suspension, Take 5 mLs (500,000 Units total) by mouth 4 (four) times daily., Disp: 60 mL, Rfl: 0   OXYGEN, Inhale 5 L into the lungs continuous. Use with resmed ventilator, Disp: , Rfl:    pantoprazole  (PROTONIX) 40 MG tablet, Take 1 tablet (40 mg total) by mouth daily., Disp: 30 tablet, Rfl: 0   Plecanatide (TRULANCE) 3 MG TABS, Take 1 tablet (3 mg total) by mouth daily., Disp: 30 tablet, Rfl: 6   potassium chloride SA (KLOR-CON M) 20 MEQ tablet, Take two tablets daily for the next two days, then take 2 tablets (40 mEq total) with weekly Metolazone dosage. (Patient taking differently: take 2 tablets (40 mEq total) with weekly Metolazone dosage.), Disp: , Rfl:    predniSONE (DELTASONE) 10 MG tablet, Take 4 tablets (40 mg total) by mouth daily for 2 days, THEN 3 tablets (30 mg total) daily for 2 days, THEN 3 tablets (30 mg total) daily for 2 days, THEN 2 tablets (20 mg total) daily for 2 days, THEN 1 tablet (10 mg total) daily for 2 days, THEN 0.5 tablets (5 mg total) daily for 2 days., Disp: 27 tablet, Rfl: 0   revefenacin (YUPELRI) 175 MCG/3ML nebulizer solution, Take 175 mcg by nebulization daily., Disp: , Rfl:    rivaroxaban (XARELTO) 20 MG TABS tablet, Take 1 tablet (20 mg total) by mouth daily with supper., Disp: 30 tablet, Rfl: 5   torsemide (DEMADEX) 20 MG tablet, Take 3 tablets (60 mg total) by mouth daily. (Patient taking differently: Take 40 mg by mouth daily.), Disp: , Rfl:    chlorpheniramine-HYDROcodone (TUSSIONEX) 10-8 MG/5ML, Take 5 mLs by mouth every 12 (twelve) hours as needed for cough. (Patient not taking: Reported on 04/08/2023), Disp: 120 mL, Rfl: 0   Na Sulfate-K Sulfate-Mg Sulf (SUPREP BOWEL PREP KIT) 17.5-3.13-1.6 GM/177ML SOLN, Take 1 kit by mouth as directed. For colonoscopy prep (Patient not taking: Reported on 04/07/2023), Disp: 354 mL, Rfl: 0 Allergies  Allergen Reactions   Allopurinol Other (See Comments)    Reaction:  Dizziness    Clindamycin Anaphylaxis and Hives   Flublok [Influenza Vaccine Recombinant] Other (See Comments)    Fever 103 with no alternative explanation day after vaccine.  Clydie Braun Highfill FNP-C   Pneumococcal 13-Val Conj Vacc Itching, Swelling and Rash    Dronedarone Rash   Montelukast Other (See Comments)    Makes her loopy.   Brovana [Arformoterol]     Caused muscle pain   Budesonide     Caused extreme joint pain   Entresto [Sacubitril-Valsartan] Other (See Comments)    hypotension   Fosamax [Alendronate Sodium] Nausea Only   Jardiance [Empagliflozin]     Caused a vaginal infection   Meperidine  Nausea And Vomiting   Microplegia Msa-Msg [Plegisol]     Loopy,diarrhea   Rosuvastatin Other (See Comments)    Reaction:  Muscle spasms    Tetracycline Hives   Adhesive [Tape] Rash   Lovastatin Rash and Other (See Comments)    Muscle Pain      Social History   Socioeconomic History   Marital status: Widowed    Spouse name: Not on file   Number of children: 1   Years of education: Not on file   Highest education level: Not on file  Occupational History   Occupation: retired  Tobacco Use   Smoking status: Former    Current packs/day: 0.00    Average packs/day: 1 pack/day for 20.0 years (20.0 ttl pk-yrs)    Types: Cigarettes    Start date: 06/09/1967    Quit date: 06/09/1987    Years since quitting: 35.8    Passive exposure: Past   Smokeless tobacco: Never   Tobacco comments:    Former smoker 12/25/21  Vaping Use   Vaping status: Never Used  Substance and Sexual Activity   Alcohol use: No    Alcohol/week: 0.0 standard drinks of alcohol   Drug use: No   Sexual activity: Not Currently  Other Topics Concern   Not on file  Social History Narrative   Pt lives in [place] alone.  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  Her son works in [employer].   Social Determinants of Health   Financial Resource Strain: Low Risk  (05/21/2022)   Overall Financial Resource Strain (CARDIA)    Difficulty of Paying Living Expenses: Not very hard  Food Insecurity: No Food Insecurity (04/07/2023)   Hunger Vital Sign    Worried About Running Out of Food in the Last Year: Never true    Ran Out of Food in the Last Year: Never  true  Transportation Needs: No Transportation Needs (04/07/2023)   PRAPARE - Administrator, Civil Service (Medical): No    Lack of Transportation (Non-Medical): No  Physical Activity: Insufficiently Active (05/21/2022)   Exercise Vital Sign    Days of Exercise per Week: 2 days    Minutes of Exercise per Session: 20 min  Stress: No Stress Concern Present (05/21/2022)   [employer] of Occupational Health - Occupational Stress Questionnaire    Feeling of Stress : Only a little  Social Connections: Moderately Isolated (05/21/2022)   Social Connection and Isolation Panel [NHANES]    Frequency of Communication with Friends and Family: More than three times a week    Frequency of Social Gatherings with Friends and Family: More than three times a week    Attends Religious Services: Never    Database administrator or Organizations: Yes    Attends Engineer, structural: More than 4 times per year    Marital Status: Widowed  Intimate Partner Violence: Not At Risk (03/27/2023)   Humiliation, Afraid, Rape, and Kick questionnaire    Fear of Current or Ex-Partner: No    Emotionally Abused: No    Physically Abused: No    Sexually Abused: No    Physical Exam      Future Appointments  Date Time Provider Department Center  04/12/2023  2:20 PM Fuller Plan, MD [facility] None  04/13/2023  7:05 AM [facility] DEVICE REMOTES [facility]  04/14/2023 11:30 AM Colletta Maryland, RN [facility]N-[facility] None  04/19/2023  2:30 PM [facility] PA/NP SWING [facility] None  04/22/2023  12:30 PM WL-CT 1 WL-CT [place]  04/26/2023 12:00 PM [facility] PA/NP [facility] None  05/20/2023 11:00 AM Fenton, Clint R, PA [facility] None  06/30/2023  2:20 PM [facility] DEVICE REMOTES [facility]  07/29/2023 10:20 AM Rice, Jamesetta Orleans, MD [facility] None  09/23/2023 10:15 AM Windell Norfolk, MD [facility] None  09/29/2023  2:20 PM [facility] DEVICE REMOTES [facility]  12/29/2023   2:20 PM [facility] DEVICE REMOTES [facility]       Kerry Hough, Paramedic [phone] [facility] Paramedic  04/08/23

## 2023-04-09 ENCOUNTER — Other Ambulatory Visit: Payer: Self-pay

## 2023-04-09 ENCOUNTER — Inpatient Hospital Stay (HOSPITAL_COMMUNITY)
Admission: EM | Admit: 2023-04-09 | Discharge: 2023-04-13 | DRG: 641 | Disposition: A | Discharge status: Home Health | Payer: Medicare Other | Attending: Family Medicine | Admitting: Family Medicine

## 2023-04-09 ENCOUNTER — Emergency Department (HOSPITAL_COMMUNITY): Payer: Medicare Other

## 2023-04-09 ENCOUNTER — Encounter (HOSPITAL_COMMUNITY): Payer: Self-pay

## 2023-04-09 DIAGNOSIS — I428 Other cardiomyopathies: Secondary | ICD-10-CM | POA: Diagnosis present

## 2023-04-09 DIAGNOSIS — E1140 Type 2 diabetes mellitus with diabetic neuropathy, unspecified: Secondary | ICD-10-CM | POA: Diagnosis present

## 2023-04-09 DIAGNOSIS — Z825 Family history of asthma and other chronic lower respiratory diseases: Secondary | ICD-10-CM

## 2023-04-09 DIAGNOSIS — Z79899 Other long term (current) drug therapy: Secondary | ICD-10-CM | POA: Diagnosis not present

## 2023-04-09 DIAGNOSIS — G4733 Obstructive sleep apnea (adult) (pediatric): Secondary | ICD-10-CM | POA: Diagnosis present

## 2023-04-09 DIAGNOSIS — F321 Major depressive disorder, single episode, moderate: Secondary | ICD-10-CM | POA: Diagnosis present

## 2023-04-09 DIAGNOSIS — Z91048 Other nonmedicinal substance allergy status: Secondary | ICD-10-CM

## 2023-04-09 DIAGNOSIS — I5022 Chronic systolic (congestive) heart failure: Secondary | ICD-10-CM | POA: Diagnosis not present

## 2023-04-09 DIAGNOSIS — K5792 Diverticulitis of intestine, part unspecified, without perforation or abscess without bleeding: Secondary | ICD-10-CM | POA: Diagnosis not present

## 2023-04-09 DIAGNOSIS — N823 Fistula of vagina to large intestine: Secondary | ICD-10-CM | POA: Diagnosis not present

## 2023-04-09 DIAGNOSIS — I4811 Longstanding persistent atrial fibrillation: Secondary | ICD-10-CM | POA: Diagnosis present

## 2023-04-09 DIAGNOSIS — J449 Chronic obstructive pulmonary disease, unspecified: Secondary | ICD-10-CM | POA: Diagnosis present

## 2023-04-09 DIAGNOSIS — Z90710 Acquired absence of both cervix and uterus: Secondary | ICD-10-CM

## 2023-04-09 DIAGNOSIS — Z887 Allergy status to serum and vaccine status: Secondary | ICD-10-CM

## 2023-04-09 DIAGNOSIS — I4891 Unspecified atrial fibrillation: Secondary | ICD-10-CM

## 2023-04-09 DIAGNOSIS — I5042 Chronic combined systolic (congestive) and diastolic (congestive) heart failure: Secondary | ICD-10-CM | POA: Diagnosis present

## 2023-04-09 DIAGNOSIS — E876 Hypokalemia: Secondary | ICD-10-CM | POA: Diagnosis present

## 2023-04-09 DIAGNOSIS — Z6834 Body mass index (BMI) 34.0-34.9, adult: Secondary | ICD-10-CM | POA: Diagnosis not present

## 2023-04-09 DIAGNOSIS — K5732 Diverticulitis of large intestine without perforation or abscess without bleeding: Secondary | ICD-10-CM | POA: Diagnosis present

## 2023-04-09 DIAGNOSIS — E1122 Type 2 diabetes mellitus with diabetic chronic kidney disease: Secondary | ICD-10-CM | POA: Diagnosis present

## 2023-04-09 DIAGNOSIS — E860 Dehydration: Secondary | ICD-10-CM | POA: Diagnosis present

## 2023-04-09 DIAGNOSIS — Z7901 Long term (current) use of anticoagulants: Secondary | ICD-10-CM

## 2023-04-09 DIAGNOSIS — G4730 Sleep apnea, unspecified: Secondary | ICD-10-CM | POA: Diagnosis not present

## 2023-04-09 DIAGNOSIS — I130 Hypertensive heart and chronic kidney disease with heart failure and stage 1 through stage 4 chronic kidney disease, or unspecified chronic kidney disease: Secondary | ICD-10-CM | POA: Diagnosis present

## 2023-04-09 DIAGNOSIS — Z87891 Personal history of nicotine dependence: Secondary | ICD-10-CM

## 2023-04-09 DIAGNOSIS — N824 Other female intestinal-genital tract fistulae: Secondary | ICD-10-CM | POA: Diagnosis present

## 2023-04-09 DIAGNOSIS — N939 Abnormal uterine and vaginal bleeding, unspecified: Secondary | ICD-10-CM | POA: Diagnosis not present

## 2023-04-09 DIAGNOSIS — T502X5A Adverse effect of carbonic-anhydrase inhibitors, benzothiadiazides and other diuretics, initial encounter: Secondary | ICD-10-CM | POA: Diagnosis present

## 2023-04-09 DIAGNOSIS — Z950 Presence of cardiac pacemaker: Secondary | ICD-10-CM | POA: Diagnosis present

## 2023-04-09 DIAGNOSIS — E669 Obesity, unspecified: Secondary | ICD-10-CM | POA: Diagnosis present

## 2023-04-09 DIAGNOSIS — J9611 Chronic respiratory failure with hypoxia: Secondary | ICD-10-CM | POA: Diagnosis present

## 2023-04-09 DIAGNOSIS — M109 Gout, unspecified: Secondary | ICD-10-CM | POA: Diagnosis present

## 2023-04-09 DIAGNOSIS — Z881 Allergy status to other antibiotic agents status: Secondary | ICD-10-CM

## 2023-04-09 DIAGNOSIS — J8489 Other specified interstitial pulmonary diseases: Secondary | ICD-10-CM | POA: Insufficient documentation

## 2023-04-09 DIAGNOSIS — E039 Hypothyroidism, unspecified: Secondary | ICD-10-CM | POA: Diagnosis present

## 2023-04-09 DIAGNOSIS — N321 Vesicointestinal fistula: Secondary | ICD-10-CM | POA: Diagnosis present

## 2023-04-09 DIAGNOSIS — J398 Other specified diseases of upper respiratory tract: Secondary | ICD-10-CM | POA: Diagnosis present

## 2023-04-09 DIAGNOSIS — N1831 Chronic kidney disease, stage 3a: Secondary | ICD-10-CM | POA: Diagnosis present

## 2023-04-09 DIAGNOSIS — I4819 Other persistent atrial fibrillation: Secondary | ICD-10-CM | POA: Diagnosis not present

## 2023-04-09 DIAGNOSIS — K5900 Constipation, unspecified: Secondary | ICD-10-CM | POA: Diagnosis present

## 2023-04-09 DIAGNOSIS — Z888 Allergy status to other drugs, medicaments and biological substances status: Secondary | ICD-10-CM

## 2023-04-09 LAB — I-STAT CHEM 8, ED
BUN: 27 mg/dL — ABNORMAL HIGH (ref 8–23)
Calcium, Ion: 0.99 mmol/L — ABNORMAL LOW (ref 1.15–1.40)
Chloride: 89 mmol/L — ABNORMAL LOW (ref 98–111)
Creatinine, Ser: 1 mg/dL (ref 0.44–1.00)
Glucose, Bld: 206 mg/dL — ABNORMAL HIGH (ref 70–99)
HCT: 43 % (ref 36.0–46.0)
Hemoglobin: 14.6 g/dL (ref 12.0–15.0)
Potassium: 2.3 mmol/L — CL (ref 3.5–5.1)
Sodium: 135 mmol/L (ref 135–145)
TCO2: 27 mmol/L (ref 22–32)

## 2023-04-09 LAB — I-STAT CG4 LACTIC ACID, ED
Lactic Acid, Venous: 2.8 mmol/L (ref 0.5–1.9)
Lactic Acid, Venous: 2.9 mmol/L (ref 0.5–1.9)

## 2023-04-09 LAB — MAGNESIUM
Magnesium: 2.3 mg/dL (ref 1.7–2.4)
Magnesium: 2.3 mg/dL (ref 1.7–2.4)

## 2023-04-09 LAB — CBC WITH DIFFERENTIAL/PLATELET
Abs Immature Granulocytes: 0.25 10*3/uL — ABNORMAL HIGH (ref 0.00–0.07)
Basophils Absolute: 0.1 10*3/uL (ref 0.0–0.1)
Basophils Relative: 0 %
Eosinophils Absolute: 0.1 10*3/uL (ref 0.0–0.5)
Eosinophils Relative: 0 %
HCT: 43.9 % (ref 36.0–46.0)
Hemoglobin: 13.8 g/dL (ref 12.0–15.0)
Immature Granulocytes: 1 %
Lymphocytes Relative: 5 %
Lymphs Abs: 1 10*3/uL (ref 0.7–4.0)
MCH: 26.1 pg (ref 26.0–34.0)
MCHC: 31.4 g/dL (ref 30.0–36.0)
MCV: 83.1 fL (ref 80.0–100.0)
Monocytes Absolute: 1.3 10*3/uL — ABNORMAL HIGH (ref 0.1–1.0)
Monocytes Relative: 6 %
Neutro Abs: 18.2 10*3/uL — ABNORMAL HIGH (ref 1.7–7.7)
Neutrophils Relative %: 88 %
Platelets: 283 10*3/uL (ref 150–400)
RBC: 5.28 MIL/uL — ABNORMAL HIGH (ref 3.87–5.11)
RDW: 15.6 % — ABNORMAL HIGH (ref 11.5–15.5)
WBC: 20.9 10*3/uL — ABNORMAL HIGH (ref 4.0–10.5)
nRBC: 0 % (ref 0.0–0.2)

## 2023-04-09 LAB — COMPREHENSIVE METABOLIC PANEL
ALT: 14 U/L (ref 0–44)
AST: 20 U/L (ref 15–41)
Albumin: 4 g/dL (ref 3.5–5.0)
Alkaline Phosphatase: 37 U/L — ABNORMAL LOW (ref 38–126)
Anion gap: 21 — ABNORMAL HIGH (ref 5–15)
BUN: 28 mg/dL — ABNORMAL HIGH (ref 8–23)
CO2: 26 mmol/L (ref 22–32)
Calcium: 8.9 mg/dL (ref 8.9–10.3)
Chloride: 89 mmol/L — ABNORMAL LOW (ref 98–111)
Creatinine, Ser: 1.14 mg/dL — ABNORMAL HIGH (ref 0.44–1.00)
GFR, Estimated: 50 mL/min — ABNORMAL LOW (ref >60)
Glucose, Bld: 192 mg/dL — ABNORMAL HIGH (ref 70–99)
Potassium: 2.5 mmol/L — CL (ref 3.5–5.1)
Sodium: 136 mmol/L (ref 135–145)
Total Bilirubin: 1.2 mg/dL (ref 0.3–1.2)
Total Protein: 6.6 g/dL (ref 6.5–8.1)

## 2023-04-09 LAB — BRAIN NATRIURETIC PEPTIDE: B Natriuretic Peptide: 48.4 pg/mL (ref 0.0–100.0)

## 2023-04-09 LAB — LIPASE, BLOOD: Lipase: 58 U/L — ABNORMAL HIGH (ref 11–51)

## 2023-04-09 LAB — POTASSIUM: Potassium: 3.6 mmol/L (ref 3.5–5.1)

## 2023-04-09 LAB — TROPONIN I (HIGH SENSITIVITY)
Troponin I (High Sensitivity): 10 ng/L (ref <18)
Troponin I (High Sensitivity): 8 ng/L (ref <18)

## 2023-04-09 MED ORDER — TORSEMIDE 20 MG PO TABS: 40.0000 mg | ORAL_TABLET | Freq: Every day | ORAL | Status: DC

## 2023-04-09 MED ORDER — PREDNISONE 20 MG PO TABS
20.0000 mg | ORAL_TABLET | Freq: Every day | ORAL | Status: AC
  Administered 2023-04-10 – 2023-04-11 (×2): 20 mg via ORAL
  Filled 2023-04-09 (×2): qty 1

## 2023-04-09 MED ORDER — REVEFENACIN 175 MCG/3ML IN SOLN
175.0000 ug | Freq: Every day | RESPIRATORY_TRACT | Status: DC
  Filled 2023-04-09: qty 3

## 2023-04-09 MED ORDER — ONDANSETRON HCL 4 MG PO TABS: 4.0000 mg | ORAL_TABLET | Freq: Four times a day (QID) | ORAL | Status: DC | PRN

## 2023-04-09 MED ORDER — PREDNISONE 10 MG PO TABS
10.0000 mg | ORAL_TABLET | Freq: Every day | ORAL | Status: AC
  Administered 2023-04-12 – 2023-04-13 (×2): 10 mg via ORAL
  Filled 2023-04-09 (×2): qty 1

## 2023-04-09 MED ORDER — ACETAMINOPHEN 650 MG RE SUPP: 650.0000 mg | Freq: Four times a day (QID) | RECTAL | Status: DC | PRN

## 2023-04-09 MED ORDER — FEBUXOSTAT 40 MG PO TABS
40.0000 mg | ORAL_TABLET | Freq: Every day | ORAL | Status: DC
  Administered 2023-04-10 – 2023-04-13 (×4): 40 mg via ORAL
  Filled 2023-04-09 (×4): qty 1

## 2023-04-09 MED ORDER — GABAPENTIN 100 MG PO CAPS
200.0000 mg | ORAL_CAPSULE | Freq: Every day | ORAL | Status: DC
  Administered 2023-04-09 – 2023-04-12 (×4): 200 mg via ORAL
  Filled 2023-04-09 (×4): qty 2

## 2023-04-09 MED ORDER — ACETAMINOPHEN 325 MG PO TABS
650.0000 mg | ORAL_TABLET | Freq: Four times a day (QID) | ORAL | Status: DC | PRN
  Administered 2023-04-12: 650 mg via ORAL

## 2023-04-09 MED ORDER — LEVALBUTEROL HCL 0.63 MG/3ML IN NEBU: 0.6300 mg | INHALATION_SOLUTION | Freq: Four times a day (QID) | RESPIRATORY_TRACT | Status: DC | PRN

## 2023-04-09 MED ORDER — GUAIFENESIN ER 600 MG PO TB12
600.0000 mg | ORAL_TABLET | Freq: Two times a day (BID) | ORAL | Status: DC
  Administered 2023-04-09 – 2023-04-13 (×8): 600 mg via ORAL
  Filled 2023-04-09 (×8): qty 1

## 2023-04-09 MED ORDER — SODIUM CHLORIDE 0.9 % IV BOLUS: 1000.0000 mL | Freq: Once | INTRAVENOUS | Status: DC

## 2023-04-09 MED ORDER — SODIUM CHLORIDE 0.9 % IV BOLUS
500.0000 mL | Freq: Once | INTRAVENOUS | Status: AC
  Administered 2023-04-09: 500 mL via INTRAVENOUS

## 2023-04-09 MED ORDER — POTASSIUM CHLORIDE 20 MEQ PO PACK
40.0000 meq | PACK | Freq: Once | ORAL | Status: AC
  Administered 2023-04-09: 40 meq via ORAL
  Filled 2023-04-09: qty 2

## 2023-04-09 MED ORDER — POTASSIUM CHLORIDE 10 MEQ/100ML IV SOLN
10.0000 meq | INTRAVENOUS | Status: DC
Start: 2023-04-09 — End: 2023-04-10
  Administered 2023-04-09: 10 meq via INTRAVENOUS
  Filled 2023-04-09: qty 100

## 2023-04-09 MED ORDER — DOFETILIDE 250 MCG PO CAPS
250.0000 ug | ORAL_CAPSULE | Freq: Two times a day (BID) | ORAL | Status: DC
  Administered 2023-04-09 – 2023-04-13 (×8): 250 ug via ORAL
  Filled 2023-04-09 (×12): qty 1

## 2023-04-09 MED ORDER — NYSTATIN 100000 UNIT/ML MT SUSP
5.0000 mL | Freq: Four times a day (QID) | OROMUCOSAL | Status: DC
  Administered 2023-04-09 – 2023-04-13 (×13): 500000 [IU] via ORAL
  Filled 2023-04-09 (×14): qty 5

## 2023-04-09 MED ORDER — VANCOMYCIN HCL 1500 MG/300ML IV SOLN
1500.0000 mg | Freq: Once | INTRAVENOUS | Status: AC
  Administered 2023-04-09: 1500 mg via INTRAVENOUS
  Filled 2023-04-09: qty 300

## 2023-04-09 MED ORDER — PANTOPRAZOLE SODIUM 40 MG PO TBEC
40.0000 mg | DELAYED_RELEASE_TABLET | Freq: Every day | ORAL | Status: DC
  Administered 2023-04-10 – 2023-04-13 (×4): 40 mg via ORAL
  Filled 2023-04-09 (×5): qty 1

## 2023-04-09 MED ORDER — RIVAROXABAN 20 MG PO TABS
20.0000 mg | ORAL_TABLET | Freq: Every day | ORAL | Status: DC
  Administered 2023-04-10 – 2023-04-12 (×3): 20 mg via ORAL
  Filled 2023-04-09 (×3): qty 1

## 2023-04-09 MED ORDER — IOHEXOL 350 MG/ML SOLN
75.0000 mL | Freq: Once | INTRAVENOUS | Status: AC | PRN
  Administered 2023-04-09: 75 mL via INTRAVENOUS

## 2023-04-09 MED ORDER — DOCUSATE SODIUM 100 MG PO CAPS
100.0000 mg | ORAL_CAPSULE | Freq: Two times a day (BID) | ORAL | Status: DC
  Administered 2023-04-09 – 2023-04-12 (×5): 100 mg via ORAL
  Filled 2023-04-09 (×8): qty 1

## 2023-04-09 MED ORDER — REVEFENACIN 175 MCG/3ML IN SOLN
175.0000 ug | Freq: Every day | RESPIRATORY_TRACT | Status: DC
  Administered 2023-04-10 – 2023-04-13 (×4): 175 ug via RESPIRATORY_TRACT
  Filled 2023-04-09 (×4): qty 3

## 2023-04-09 MED ORDER — MELATONIN 3 MG PO TABS
3.0000 mg | ORAL_TABLET | Freq: Every evening | ORAL | Status: DC | PRN
  Administered 2023-04-10 – 2023-04-12 (×2): 3 mg via ORAL
  Filled 2023-04-09 (×2): qty 1

## 2023-04-09 MED ORDER — ONDANSETRON HCL 4 MG/2ML IJ SOLN: 4.0000 mg | Freq: Four times a day (QID) | INTRAMUSCULAR | Status: DC | PRN

## 2023-04-09 MED ORDER — PREDNISONE 20 MG PO TABS
30.0000 mg | ORAL_TABLET | Freq: Every day | ORAL | Status: AC
  Filled 2023-04-09: qty 2

## 2023-04-09 MED ORDER — SODIUM CHLORIDE 0.9 % IV SOLN
2.0000 g | Freq: Two times a day (BID) | INTRAVENOUS | Status: DC
  Administered 2023-04-10: 2 g via INTRAVENOUS
  Filled 2023-04-09: qty 12.5

## 2023-04-09 MED ORDER — PREDNISONE 5 MG PO TABS: 5.0000 mg | ORAL_TABLET | Freq: Every day | ORAL | Status: DC

## 2023-04-09 MED ORDER — SODIUM CHLORIDE 0.9 % IV SOLN
2.0000 g | Freq: Once | INTRAVENOUS | Status: AC
  Administered 2023-04-09: 2 g via INTRAVENOUS
  Filled 2023-04-09: qty 12.5

## 2023-04-09 MED ORDER — METRONIDAZOLE 500 MG/100ML IV SOLN
500.0000 mg | Freq: Once | INTRAVENOUS | Status: AC
  Administered 2023-04-09: 500 mg via INTRAVENOUS
  Filled 2023-04-09: qty 100

## 2023-04-09 MED ORDER — METRONIDAZOLE 500 MG/100ML IV SOLN
500.0000 mg | Freq: Two times a day (BID) | INTRAVENOUS | Status: DC
  Administered 2023-04-10: 500 mg via INTRAVENOUS
  Filled 2023-04-09: qty 100

## 2023-04-09 MED ORDER — POTASSIUM CHLORIDE 10 MEQ/100ML IV SOLN
10.0000 meq | INTRAVENOUS | Status: AC
  Administered 2023-04-09 (×4): 10 meq via INTRAVENOUS
  Filled 2023-04-09 (×4): qty 100

## 2023-04-09 MED ORDER — MAGNESIUM OXIDE -MG SUPPLEMENT 400 (240 MG) MG PO TABS
400.0000 mg | ORAL_TABLET | Freq: Every day | ORAL | Status: DC
  Administered 2023-04-09 – 2023-04-12 (×4): 400 mg via ORAL
  Filled 2023-04-09 (×4): qty 1

## 2023-04-09 NOTE — ED Notes (Signed)
Patient transported to CT 

## 2023-04-09 NOTE — ED Triage Notes (Signed)
BIB [facility] from [place] at [facility] with complaints of generalized weakness, nausea, dizziness and increasaed shortness of breath. Was discharged from [facility] Sunday after a 10 day stay with pneumonia and diverticulitis. Per patient she was started on new medications and has been feeling like this since starting them, she feels the medications have something to do with her symptoms. EMS reports she is being weaned off prednisone, and her lungs sound clear.   Patient is on 4L of oxygen at baseline. She received 4mg  of Zofran enroute. EMS reports VSS and patient ventricular paced at 89.

## 2023-04-09 NOTE — Progress Notes (Signed)
[facility] Acute Care Liaison Note:  This patient is currently enrolled in [facility] outpatient-based palliative care.   Please call for any outpatient based palliative care related questions or concerns.  Thank you,  Glenna Fellows, BSN, RN, OCN [facility] Liaison [phone]

## 2023-04-09 NOTE — Patient Outreach (Signed)
  Care Coordination   Follow Up Visit Note   04/08/2023 Name: Kathleen [name] MRN: [id number] DOB: 1945/07/14  Kathleen [name] is a 77 y.o. year old female who sees [name], MD for primary care. I spoke with  [name] by phone today.  What matters to the patients health and wellness today?  Symptom Management    Goals Addressed             This Visit's Progress    Obtain Supportive Resources-Housing   On track    Activities and task to complete in order to accomplish goals.   Keep all upcoming appointments discussed today Continue with compliance of taking medication prescribed by Doctor Implement healthy coping skills discussed to assist with management of symptoms Use housing resources/websites discussed to review local options         SDOH assessments and interventions completed:  No     Care Coordination Interventions:  Yes, provided  Interventions Today    Flowsheet Row Most Recent Value  Chronic Disease   Chronic disease during today's visit Congestive Heart Failure (CHF), Atrial Fibrillation (AFib), Chronic Obstructive Pulmonary Disease (COPD), Chronic Kidney Disease/End Stage Renal Disease (ESRD), Other  [Depression]  General Interventions   General Interventions Discussed/Reviewed General Interventions Reviewed, Doctor Visits  [Patient reports she is feeling weak today. Paramedicine will visit today and check in once a week. Hospital completed referral to [facility],  however, pt has not heard from scheduling. Family assisting with transportation, [facility] cant provide service currently]  Doctor Visits Discussed/Reviewed Doctor Visits Reviewed  Mental Health Interventions   Mental Health Discussed/Reviewed Mental Health Reviewed, Coping Strategies, Depression  [Strategies to prioritze self-care discussed.]  Nutrition Interventions   Nutrition Discussed/Reviewed Nutrition Reviewed  [[facility] staff has provided dinner to unit, due to recent release from  hospital. Patient plans to visit dining hall for dinner over the weekend, if able]  Safety Interventions   Safety Discussed/Reviewed Safety Reviewed       Follow up plan: Follow up call scheduled for 2-4 weeks    Encounter Outcome:  Patient Visit Completed   Jenel Lucks, MSW, LCSW [facility] Care Management [facility]  [facility] [email or web address] Phone [phone] 5:19 AM

## 2023-04-09 NOTE — H&P (Addendum)
History and Physical    Patient: Kathleen [name]:[id number] DOB: 1946-06-05 DOA: 04/09/2023 DOS: the patient was seen and examined on 04/09/2023 PCP: [name], MD  Patient coming from: Home/ assisted living  Chief Complaint:  Chief Complaint  Patient presents with   multiple complaints    HPI: Kathleen [name] is a 77 y.o. 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  She was hospitalized on October 18 and was just discharged 5 days ago.  She was treated for recurrent complicated diverticulitis and a COPD exacerbation.  She was discharged on oral doxycycline and Flagyl.  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 The patient reports that when she left the hospital she was extremely swollen her especially her legs and toes.  She was discharged on 3 torsemide a day which was up from her baseline of just 1 torsemide a day.  Her swelling was slowly getting better.  She has metolazone to take once a week which she did.  This morning she was feeling very dizzy so she presented to the emergency department.  Her potassium was found to be 2.3.  The hospitalist service is asked to admit for this reason.  Because she did complain of left lower quadrant pain she did have a CT scan of her abdomen pelvis which reveals active diverticulitis despite the fact that she has been on antibiotics for 2 weeks.  The patients pain was much better until the last couple of days.   Review of Systems: As mentioned in the history of present illness. All other systems reviewed and are negative. 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    Concussion 10/04/2021   COPD (chronic obstructive pulmonary disease) (HCC)    Diverticulitis    Gout    Hypertension    Longstanding persistent atrial fibrillation (HCC)    on Xarelto   Nonischemic cardiomyopathy (HCC)    Obesity    Pacemaker    Spontaneous pneumothorax 2013   Past Surgical History:  Procedure Laterality Date   ABDOMINAL HYSTERECTOMY     APPENDECTOMY     ATRIAL FIBRILLATION ABLATION  07/20/2013   by Dr Christin Fudge   AV nodal ablation  11/01/2013   by Dr Christin Fudge, repeated by Dr Wilford Grist   BREAST BIOPSY Bilateral 1997   negative   CARDIAC CATHETERIZATION     CHOLECYSTECTOMY     HERNIA REPAIR     PACEMAKER INSERTION  06/2017   MDT Viva CRT-P implanted by Dr Christin Fudge after AV nodal ablation,  LV lead could not be placed   PACEMAKER INSERTION  05/2020   with lead bundle   RIGHT/LEFT HEART CATH AND CORONARY ANGIOGRAPHY N/A 03/20/2022   Procedure: RIGHT/LEFT HEART CATH AND CORONARY ANGIOGRAPHY;  Surgeon: [place], Peter M, MD;  Location: [facility] INVASIVE CV LAB;  Service: Cardiovascular;  Laterality: N/A;   Social History:  reports that she quit smoking about 35 years  ago. Her smoking use included cigarettes. She started smoking about 55 years ago. She has a 20 pack-year smoking history. She has been exposed to tobacco smoke. She has never used smokeless tobacco. She reports that she does not drink alcohol and does not use drugs.  Allergies  Allergen Reactions   Allopurinol Other (See Comments)    Reaction:  Dizziness    Clindamycin Anaphylaxis and Hives   Flublok [Influenza Vaccine Recombinant] Other (See Comments)    Fever 103 with no alternative explanation day after vaccine.  Clydie Braun Highfill FNP-C   Pneumococcal 13-Val Conj Vacc Itching, Swelling and Rash   Dronedarone Rash    Montelukast Other (See Comments)    Makes her loopy.   Brovana [Arformoterol]     Caused muscle pain   Budesonide     Caused extreme joint pain   Entresto [Sacubitril-Valsartan] Other (See Comments)    hypotension   Fosamax [Alendronate Sodium] Nausea Only   Jardiance [Empagliflozin]     Caused a vaginal infection   Meperidine Nausea And Vomiting   Microplegia Msa-Msg [Plegisol]     Loopy,diarrhea   Rosuvastatin Other (See Comments)    Reaction:  Muscle spasms    Tetracycline Hives   Adhesive [Tape] Rash   Lovastatin Rash and Other (See Comments)    Muscle Pain    Family History  Problem Relation Age of Onset   Breast cancer Mother 64       3 different times   Pancreatic cancer Mother    Emphysema Father    Healthy Brother    Breast cancer Cousin    Valvular heart disease Son    Obesity Daughter    Colon cancer Neg Hx    Esophageal cancer Neg Hx    Stomach cancer Neg Hx    Inflammatory bowel disease Neg Hx    Liver disease Neg Hx    Rectal cancer Neg Hx     Prior to Admission medications   Medication Sig Start Date End Date Taking? Authorizing Provider  acetaminophen (TYLENOL) 650 MG CR tablet Take 1,300 mg by mouth every 8 (eight) hours as needed for pain.   Yes [provider]  azelastine (ASTELIN) 0.1 % nasal spray Place 2 sprays into both nostrils 2 (two) times daily. 11/05/22  Yes Oretha Milch, MD  Cholecalciferol (VITAMIN D3) 50 MCG (2000 UT) TABS Take 2,000 Units by mouth at bedtime.   Yes [provider]  cyanocobalamin (VITAMIN B12) 1000 MCG/ML injection Inject 1 mL (1,000 mcg total) into the muscle every 30 (thirty) days. 05/05/22  Yes [name], MD  docusate sodium (COLACE) 100 MG capsule Take 100 mg by mouth 2 (two) times daily.   Yes [provider]  dofetilide (TIKOSYN) 250 MCG capsule Take 1 capsule (250 mcg total) by mouth in the morning and at bedtime. 12/01/22 12/01/23 Yes Fenton, Clint R, PA  doxycycline (VIBRA-TABS)  100 MG tablet Take 1 tablet (100 mg total) by mouth every 12 (twelve) hours for 7 days. 04/04/23 04/11/23 Yes Almon Hercules, MD  febuxostat (ULORIC) 40 MG tablet TAKE 1 TABLET(40 MG) BY MOUTH DAILY 04/05/23  Yes Rice, Jamesetta Orleans, MD  fluticasone furoate-vilanterol (BREO ELLIPTA) 100-25 MCG/ACT AEPB INHALE 1 PUFF INTO THE LUNGS DAILY 03/23/23  Yes Oretha Milch, MD  gabapentin (NEURONTIN) 100 MG capsule Take 2 capsules (200 mg total) by mouth at bedtime. 11/03/22 06/01/23 Yes Camara, Amalia Hailey, MD  guaiFENesin (MUCINEX) 600 MG 12 hr tablet Take 1 tablet (600  mg total) by mouth 2 (two) times daily for 5 days. 04/04/23 04/09/23 Yes Almon Hercules, MD  levalbuterol (XOPENEX) 0.63 MG/3ML nebulizer solution Take 3 mLs (0.63 mg total) by nebulization every 6 (six) hours as needed for wheezing or shortness of breath. DX J44.89 J96.21 01/20/23  Yes Oretha Milch, MD  Magnesium 500 MG TABS Take 500 mg by mouth at bedtime.   Yes [provider]  metolazone (ZAROXOLYN) 2.5 MG tablet Take 1 tablet (2.5 mg total) by mouth once a week. MUST TAKE OF POTASSIUM WITH THIS 03/24/23 06/22/23 Yes Clegg, Amy D, NP  metroNIDAZOLE (FLAGYL) 500 MG tablet Take 1 tablet (500 mg total) by mouth every 12 (twelve) hours for 7 days. 04/04/23 04/11/23 Yes Almon Hercules, MD  nystatin (MYCOSTATIN) 100000 UNIT/ML suspension Take 5 mLs (500,000 Units total) by mouth 4 (four) times daily. 04/04/23  Yes Almon Hercules, MD  OXYGEN Inhale 5 L into the lungs continuous. Use with resmed ventilator   Yes [provider]  pantoprazole (PROTONIX) 40 MG tablet Take 1 tablet (40 mg total) by mouth daily. 04/05/23  Yes Almon Hercules, MD  potassium chloride SA (KLOR-CON M) 20 MEQ tablet Take two tablets daily for the next two days, then take 2 tablets (40 mEq total) with weekly Metolazone dosage. Patient taking differently: take 2 tablets (40 mEq total) with weekly Metolazone dosage. 04/04/23  Yes Almon Hercules, MD  predniSONE  (DELTASONE) 10 MG tablet Take 4 tablets (40 mg total) by mouth daily for 2 days, THEN 3 tablets (30 mg total) daily for 2 days, THEN 3 tablets (30 mg total) daily for 2 days, THEN 2 tablets (20 mg total) daily for 2 days, THEN 1 tablet (10 mg total) daily for 2 days, THEN 0.5 tablets (5 mg total) daily for 2 days. 04/04/23 04/16/23 Yes Almon Hercules, MD  revefenacin (YUPELRI) 175 MCG/3ML nebulizer solution Take 175 mcg by nebulization daily.   Yes [provider]  rivaroxaban (XARELTO) 20 MG TABS tablet Take 1 tablet (20 mg total) by mouth daily with supper. 02/01/23  Yes Bensimhon, [name], MD  torsemide (DEMADEX) 20 MG tablet Take 3 tablets (60 mg total) by mouth daily. 03/24/23 06/22/23 Yes Clegg, Amy D, NP  Plecanatide (TRULANCE) 3 MG TABS Take 1 tablet (3 mg total) by mouth daily. Patient not taking: Reported on 04/09/2023 04/06/23   Mansouraty, Netty Starring., MD    Physical Exam: Vitals:   04/09/23 1200 04/09/23 1245 04/09/23 1517 04/09/23 1530  BP: 123/80 128/78 106/88 106/75  Pulse: 76 72 75 85  Resp: 14 20 20 16   Temp:   98.5 F (36.9 C)   TempSrc:   Oral   SpO2: 100% 100% 98% 99%  Weight:      Height:       Physical Exam:  General: No acute distress, well developed, well nourished HEENT: Normocephalic, atraumatic, PERRL Cardiovascular: Normal rate and rhythm. Distal pulses intact. Pulmonary: Normal pulmonary effort, normal breath sounds Gastrointestinal: Nondistended abdomen, soft, tender in LLQ, normoactive bowel sounds Musculoskeletal:Normal ROM, no lower ext edema Lymphadenopathy: No cervical LAD. Skin: Skin is warm and dry. Neuro: No focal deficits noted, AAOx3. PSYCH: Attentive and cooperative  Data Reviewed:  Results for orders placed or performed during the hospital encounter of 04/09/23 (from the past 24 hour(s))  Comprehensive metabolic panel     Status: Abnormal   Collection Time: 04/09/23 12:12 PM  Result Value Ref Range   Sodium 136 135 -  145 mmol/L    Potassium 2.5 (LL) 3.5 - 5.1 mmol/L   Chloride 89 (L) 98 - 111 mmol/L   CO2 26 22 - 32 mmol/L   Glucose, Bld 192 (H) 70 - 99 mg/dL   BUN 28 (H) 8 - 23 mg/dL   Creatinine, Ser 2.95 (H) 0.44 - 1.00 mg/dL   Calcium 8.9 8.9 - 28.4 mg/dL   Total Protein 6.6 6.5 - 8.1 g/dL   Albumin 4.0 3.5 - 5.0 g/dL   AST 20 15 - 41 U/L   ALT 14 0 - 44 U/L   Alkaline Phosphatase 37 (L) 38 - 126 U/L   Total Bilirubin 1.2 0.3 - 1.2 mg/dL   GFR, Estimated 50 (L) >60 mL/min   Anion gap 21 (H) 5 - 15  Troponin I (High Sensitivity)     Status: None   Collection Time: 04/09/23 12:12 PM  Result Value Ref Range   Troponin I (High Sensitivity) 10 <18 ng/L  Lipase, blood     Status: Abnormal   Collection Time: 04/09/23 12:12 PM  Result Value Ref Range   Lipase 58 (H) 11 - 51 U/L  CBC with Differential     Status: Abnormal   Collection Time: 04/09/23 12:12 PM  Result Value Ref Range   WBC 20.9 (H) 4.0 - 10.5 K/uL   RBC 5.28 (H) 3.87 - 5.11 MIL/uL   Hemoglobin 13.8 12.0 - 15.0 g/dL   HCT 13.2 44.0 - 10.2 %   MCV 83.1 80.0 - 100.0 fL   MCH 26.1 26.0 - 34.0 pg   MCHC 31.4 30.0 - 36.0 g/dL   RDW 72.5 (H) 36.6 - 44.0 %   Platelets 283 150 - 400 K/uL   nRBC 0.0 0.0 - 0.2 %   Neutrophils Relative % 88 %   Neutro Abs 18.2 (H) 1.7 - 7.7 K/uL   Lymphocytes Relative 5 %   Lymphs Abs 1.0 0.7 - 4.0 K/uL   Monocytes Relative 6 %   Monocytes Absolute 1.3 (H) 0.1 - 1.0 K/uL   Eosinophils Relative 0 %   Eosinophils Absolute 0.1 0.0 - 0.5 K/uL   Basophils Relative 0 %   Basophils Absolute 0.1 0.0 - 0.1 K/uL   Immature Granulocytes 1 %   Abs Immature Granulocytes 0.25 (H) 0.00 - 0.07 K/uL  Brain natriuretic peptide     Status: None   Collection Time: 04/09/23 12:12 PM  Result Value Ref Range   B Natriuretic Peptide 48.4 0.0 - 100.0 pg/mL  Troponin I (High Sensitivity)     Status: None   Collection Time: 04/09/23  2:02 PM  Result Value Ref Range   Troponin I (High Sensitivity) 8 <18 ng/L  Magnesium     Status:  None   Collection Time: 04/09/23  2:02 PM  Result Value Ref Range   Magnesium 2.3 1.7 - 2.4 mg/dL  I-stat chem 8, ED (not at [facility], [facility] or [facility])     Status: Abnormal   Collection Time: 04/09/23  2:13 PM  Result Value Ref Range   Sodium 135 135 - 145 mmol/L   Potassium 2.3 (LL) 3.5 - 5.1 mmol/L   Chloride 89 (L) 98 - 111 mmol/L   BUN 27 (H) 8 - 23 mg/dL   Creatinine, Ser 3.47 0.44 - 1.00 mg/dL   Glucose, Bld 425 (H) 70 - 99 mg/dL   Calcium, Ion 9.56 (L) 1.15 - 1.40 mmol/L   TCO2 27 22 - 32 mmol/L   Hemoglobin 14.6 12.0 -  15.0 g/dL   HCT 29.5 28.4 - 13.2 %   Comment NOTIFIED PHYSICIAN   I-Stat CG4 Lactic Acid     Status: Abnormal   Collection Time: 04/09/23  2:13 PM  Result Value Ref Range   Lactic Acid, Venous 2.8 (HH) 0.5 - 1.9 mmol/L   Comment NOTIFIED PHYSICIAN   I-Stat CG4 Lactic Acid     Status: Abnormal   Collection Time: 04/09/23  3:54 PM  Result Value Ref Range   Lactic Acid, Venous 2.9 (HH) 0.5 - 1.9 mmol/L   Comment NOTIFIED PHYSICIAN      IMPRESSION: 1. 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 No focal periarticular fluid collections or pneumoperitoneum. 2. Prominent liquid stool throughout the colon suggesting diarrheal illness. No evidence of bowel obstruction. 3. No new findings are seen. 4.  Aortic Atherosclerosis (ICD10-I70.0).   Assessment and Plan: Hypokalemia - likely due to diuretics. Replete.  Check magnesium Diverticulitis -the patient was discharged 5 days ago and has been on oral doxycycline and Flagyl since that time.  She says in the last 2 days her LLQ pain is starting to increase again.  The CT shows persistent active diverticulitis.  I think she is failing oral antibiotics.  Will stop the Doxy and resume IV antibiotics. - Consult [facility] Colovaginal fistula - her surgery appointment is December 16.  The patient is fully aware of and clearly verbalizes  the high risk of surgery versus the  low quality of life she has at this time dealing with this persistent fistula.  She seems to still be processing the decision. 4.  History of A-fib and pacemaker status post ablation - Continue Xarelto.  She is currently still on Tikosyn. 5.  Chronic respiratory failure/COPD/OSA  on 5 L O2 nasal cannula at all times and BiPAP nightly. Lungs are clear at this time.    Advance Care Planning:   Code Status: Prior  Her healthcare power of attorney is her daughter Trula Ore  Consults: Consult GI in the a.m.  Family Communication: none  Severity of Illness: The appropriate patient status for this patient is INPATIENT. Inpatient status is judged to be reasonable and necessary in order to provide the required intensity of service to ensure the patient's safety. The patient's presenting symptoms, physical exam findings, and initial radiographic and laboratory data in the context of their chronic comorbidities is felt to place them at high risk for further clinical deterioration. Furthermore, it is not anticipated that the patient will be medically stable for discharge from the hospital within 2 midnights of admission.   * I certify that at the point of admission it is my clinical judgment that the patient will require inpatient hospital care spanning beyond 2 midnights from the point of admission due to high intensity of service, high risk for further deterioration and high frequency of surveillance required.*  Author: Buena Irish, MD 04/09/2023 6:21 PM  For on call review [email or web address].

## 2023-04-09 NOTE — ED Notes (Signed)
ED TO INPATIENT HANDOFF REPORT  ED Nurse Name and Phone #: Molli Hazard [phone]  S Name/Age/Gender Kathleen [name] 77 y.o. female Room/Bed: 010C/010C  Code Status   Code Status: Prior  Home/SNF/Other Home Patient oriented to: self, place, time, and situation Is this baseline? Yes   Triage Complete: Triage complete  Chief Complaint SHOB; Weakness; Nausea  Triage Note BIB [facility] from [place] [facility] with complaints of generalized weakness, nausea, dizziness and increasaed shortness of breath. Was discharged from [facility] Sunday after a 10 day stay with pneumonia and diverticulitis. Per patient she was started on new medications and has been feeling like this since starting them, she feels the medications have something to do with her symptoms. EMS reports she is being weaned off prednisone, and her lungs sound clear.   Patient is on 4L of oxygen at baseline. She received 4mg  of Zofran enroute. EMS reports VSS and patient ventricular paced at 89.     Allergies Allergies  Allergen Reactions   Allopurinol Other (See Comments)    Reaction:  Dizziness    Clindamycin Anaphylaxis and Hives   Flublok [Influenza Vaccine Recombinant] Other (See Comments)    Fever 103 with no alternative explanation day after vaccine.  Clydie Braun Highfill FNP-C   Pneumococcal 13-Val Conj Vacc Itching, Swelling and Rash   Dronedarone Rash   Montelukast Other (See Comments)    Makes her loopy.   Brovana [Arformoterol]     Caused muscle pain   Budesonide     Caused extreme joint pain   Entresto [Sacubitril-Valsartan] Other (See Comments)    hypotension   Fosamax [Alendronate Sodium] Nausea Only   Jardiance [Empagliflozin]     Caused a vaginal infection   Meperidine Nausea And Vomiting   Microplegia Msa-Msg [Plegisol]     Loopy,diarrhea   Rosuvastatin Other (See Comments)    Reaction:  Muscle spasms    Tetracycline Hives   Adhesive [Tape] Rash   Lovastatin Rash and Other (See Comments)    Muscle  Pain    Level of Care/Admitting Diagnosis ED Disposition     ED Disposition  Admit   Condition  --   Comment  The patient appears reasonably stabilized for admission considering the current resources, flow, and capabilities available in the ED at this time, and I doubt any other [facility] requiring further screening and/or treatment in the ED prior to admission is  present.          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    Concussion 10/04/2021   COPD (chronic obstructive pulmonary disease) (HCC)    Diverticulitis    Gout    Hypertension    Longstanding persistent atrial fibrillation (HCC)    on Xarelto   Nonischemic cardiomyopathy (HCC)    Obesity    Pacemaker    Spontaneous pneumothorax 2013   Past Surgical History:  Procedure Laterality Date   ABDOMINAL HYSTERECTOMY     APPENDECTOMY     ATRIAL FIBRILLATION ABLATION  07/20/2013   by Dr Christin Fudge   AV nodal ablation  11/01/2013   by Dr Christin Fudge, repeated by Dr Wilford Grist   BREAST BIOPSY Bilateral 1997   negative   CARDIAC CATHETERIZATION     CHOLECYSTECTOMY     HERNIA REPAIR  PACEMAKER INSERTION  06/2017   MDT Viva CRT-P implanted by Dr Christin Fudge after AV nodal ablation,  LV lead could not be placed   PACEMAKER INSERTION  05/2020   with lead bundle   RIGHT/LEFT HEART CATH AND CORONARY ANGIOGRAPHY N/A 03/20/2022   Procedure: RIGHT/LEFT HEART CATH AND CORONARY ANGIOGRAPHY;  Surgeon: Swaziland, Peter M, MD;  Location: [facility] INVASIVE CV LAB;  Service: Cardiovascular;  Laterality: N/A;     A IV Location/Drains/Wounds Patient Lines/Drains/Airways Status     Active Line/Drains/Airways     Name Placement date Placement time Site Days   Peripheral IV 04/09/23 20 G Left Antecubital 04/09/23  1059   Antecubital  less than 1   Peripheral IV 04/09/23 22 G 1" Anterior;Left Forearm 04/09/23  1545  Forearm  less than 1            Intake/Output Last 24 hours No intake or output data in the 24 hours ending 04/09/23 1720  Labs/Imaging Results for orders placed or performed during the hospital encounter of 04/09/23 (from the past 48 hour(s))  Comprehensive metabolic panel     Status: Abnormal   Collection Time: 04/09/23 12:12 PM  Result Value Ref Range   Sodium 136 135 - 145 mmol/L   Potassium 2.5 (LL) 3.5 - 5.1 mmol/L    Comment: CRITICAL RESULT CALLED TO, READ BACK BY AND VERIFIED WITH: KRYSTAL ROSS,RN AT 1323 04/09/2023 BY ZBEECH. CORRECTED ON 11/01 AT 1355: PREVIOUSLY REPORTED AS REPEATED TO VERIFY CRITICAL RESULT CALLED TO, READ BACK BY AND VERIFIED WITH: KRYSTAL ROSS,RN AT 1323 04/09/2023 BY ZBEECH. 2.5, CORRECTED ON 11/01 AT 1350: PREVIOUSLY REPORTED AS REPEATED TO VERIFY  CRITICAL RESULT CALLED TO, READ BACK BY AND VERIFIED WITH: KRYSTAL ROSS,RN AT 1323 04/09/2023 BY ZBEECH.    Chloride 89 (L) 98 - 111 mmol/L   CO2 26 22 - 32 mmol/L   Glucose, Bld 192 (H) 70 - 99 mg/dL    Comment: Glucose reference range applies only to samples taken after fasting for at least 8 hours.   BUN 28 (H) 8 - 23 mg/dL   Creatinine, Ser 1.61 (H) 0.44 - 1.00 mg/dL   Calcium 8.9 8.9 - 09.6 mg/dL   Total Protein 6.6 6.5 - 8.1 g/dL   Albumin 4.0 3.5 - 5.0 g/dL   AST 20 15 - 41 U/L   ALT 14 0 - 44 U/L   Alkaline Phosphatase 37 (L) 38 - 126 U/L   Total Bilirubin 1.2 0.3 - 1.2 mg/dL   GFR, Estimated 50 (L) >60 mL/min    Comment: (NOTE) Calculated using the CKD-EPI Creatinine Equation (2021)    Anion gap 21 (H) 5 - 15    Comment: Electrolytes repeated to confirm. Performed at [facility], [address], [place], [place] [address]   Troponin I (High Sensitivity)     Status: None   Collection Time: 04/09/23 12:12 PM  Result Value Ref Range   Troponin I (High Sensitivity) 10 <18 ng/L     Comment: (NOTE) Elevated high sensitivity troponin I (hsTnI) values and significant  changes across serial measurements may suggest ACS but many other  chronic and acute conditions are known to elevate hsTnI results.  Refer to the Links section for chest pain algorithms and additional  guidance. Performed at [facility], [address] [place], [place] [address]   Lipase, blood     Status: Abnormal   Collection Time: 04/09/23 12:12 PM  Result Value Ref Range   Lipase  58 (H) 11 - 51 U/L    Comment: Performed at [facility], [address], [place], [place] [address]  CBC with Differential     Status: Abnormal   Collection Time: 04/09/23 12:12 PM  Result Value Ref Range   WBC 20.9 (H) 4.0 - 10.5 K/uL   RBC 5.28 (H) 3.87 - 5.11 MIL/uL   Hemoglobin 13.8 12.0 - 15.0 g/dL   HCT 96.2 95.2 - 84.1 %   MCV 83.1 80.0 - 100.0 fL   MCH 26.1 26.0 - 34.0 pg   MCHC 31.4 30.0 - 36.0 g/dL   RDW 32.4 (H) 40.1 - 02.7 %   Platelets 283 150 - 400 K/uL   nRBC 0.0 0.0 - 0.2 %   Neutrophils Relative % 88 %   Neutro Abs 18.2 (H) 1.7 - 7.7 K/uL   Lymphocytes Relative 5 %   Lymphs Abs 1.0 0.7 - 4.0 K/uL   Monocytes Relative 6 %   Monocytes Absolute 1.3 (H) 0.1 - 1.0 K/uL   Eosinophils Relative 0 %   Eosinophils Absolute 0.1 0.0 - 0.5 K/uL   Basophils Relative 0 %   Basophils Absolute 0.1 0.0 - 0.1 K/uL   Immature Granulocytes 1 %   Abs Immature Granulocytes 0.25 (H) 0.00 - 0.07 K/uL    Comment: Performed at [facility], [address], [place], [place] [address]  Brain natriuretic peptide     Status: None   Collection Time: 04/09/23 12:12 PM  Result Value Ref Range   B Natriuretic Peptide 48.4 0.0 - 100.0 pg/mL    Comment: Performed at [facility], [address], [place], [place] [address]  Troponin I (High Sensitivity)     Status: None   Collection Time: 04/09/23  2:02 PM  Result Value Ref Range   Troponin I (High Sensitivity) 8 <18 ng/L    Comment:  (NOTE) Elevated high sensitivity troponin I (hsTnI) values and significant  changes across serial measurements may suggest ACS but many other  chronic and acute conditions are known to elevate hsTnI results.  Refer to the "Links" section for chest pain algorithms and additional  guidance. Performed at [facility], [address], [place], [place] [address]   Magnesium     Status: None   Collection Time: 04/09/23  2:02 PM  Result Value Ref Range   Magnesium 2.3 1.7 - 2.4 mg/dL    Comment: Performed at [facility], [address] [place], [place] [address]  I-stat chem 8, ED (not at [facility], [facility] or [facility])     Status: Abnormal   Collection Time: 04/09/23  2:13 PM  Result Value Ref Range   Sodium 135 135 - 145 mmol/L   Potassium 2.3 (LL) 3.5 - 5.1 mmol/L   Chloride 89 (L) 98 - 111 mmol/L   BUN 27 (H) 8 - 23 mg/dL   Creatinine, Ser 5.64 0.44 - 1.00 mg/dL   Glucose, Bld 332 (H) 70 - 99 mg/dL    Comment: Glucose reference range applies only to samples taken after fasting for at least 8 hours.   Calcium, Ion 0.99 (L) 1.15 - 1.40 mmol/L   TCO2 27 22 - 32 mmol/L   Hemoglobin 14.6 12.0 - 15.0 g/dL   HCT 95.1 88.4 - 16.6 %   Comment NOTIFIED PHYSICIAN   I-Stat CG4 Lactic Acid     Status: Abnormal   Collection Time: 04/09/23  2:13 PM  Result Value Ref Range  Lactic Acid, Venous 2.8 (HH) 0.5 - 1.9 mmol/L   Comment NOTIFIED PHYSICIAN   I-Stat CG4 Lactic Acid     Status: Abnormal   Collection Time: 04/09/23  3:54 PM  Result Value Ref Range   Lactic Acid, Venous 2.9 (HH) 0.5 - 1.9 mmol/L   Comment NOTIFIED PHYSICIAN    CT ABDOMEN PELVIS W CONTRAST  Result Date: 04/09/2023 CLINICAL DATA:  Left lower quadrant abdominal pain. EXAM: CT ABDOMEN AND PELVIS WITH CONTRAST TECHNIQUE: Multidetector CT imaging of the abdomen and pelvis was performed using the standard protocol following bolus administration of intravenous contrast. RADIATION DOSE REDUCTION: This exam was performed according  to the departmental dose-optimization program which includes automated exposure control, adjustment of the mA and/or kV according to patient size and/or use of iterative reconstruction technique. CONTRAST:  75mL OMNIPAQUE IOHEXOL 350 MG/ML SOLN COMPARISON:  Abdominopelvic CT 04/01/2023 and 03/24/2023. FINDINGS: Lower chest: Chronic linear atelectasis or scarring at both lung bases. The heart is enlarged. Pacemaker leads appear unchanged. No significant pleural or pericardial effusion. Hepatobiliary: The liver is normal in density without suspicious focal abnormality. Unchanged hepatic cysts. No biliary dilatation status post cholecystectomy. Pancreas: Unremarkable. No pancreatic ductal dilatation or surrounding inflammatory changes. Spleen: Normal in size without focal abnormality. Adrenals/Urinary Tract: Both adrenal glands appear normal. No evidence of urinary tract calculus, suspicious renal lesion or hydronephrosis. Unchanged renal cysts bilaterally for which no specific follow-up imaging is recommended. Mild renal cortical thinning bilaterally. The bladder appears unremarkable for its degree of distention. There is no bladder wall thickening or air in the bladder lumen. Stomach/Bowel: No enteric contrast administered. The stomach appears unremarkable for its degree of distention. No small bowel distension, wall thickening or surrounding inflammation. Prominent liquid stool throughout the colon. As seen on previous studies, there are sigmoid colon diverticular changes with wall thickening and surrounding inflammatory changes suspicious for ongoing diverticulitis. 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 No focal periarticular fluid collections are identified. No evidence of bowel obstruction. Vascular/Lymphatic: There are no enlarged abdominal or pelvic lymph nodes. Aortic and branch vessel atherosclerosis without  evidence of aneurysm or large vessel occlusion. Reproductive: Hysterectomy. The adnexal regions appear stable. As above, persistent but less convincing evidence of a colovaginal fistula. There is air in the vagina. Other: Diffuse thinning and laxity of the anterior abdominal wall musculature, greater on the left. No focal hernia. No ascites or pneumoperitoneum. Musculoskeletal: No acute or significant osseous findings. Mild spondylosis. IMPRESSION: 1. 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 No focal periarticular fluid collections or pneumoperitoneum. 2. Prominent liquid stool throughout the colon suggesting diarrheal illness. No evidence of bowel obstruction. 3. No new findings are seen. 4.  Aortic Atherosclerosis (ICD10-I70.0). Electronically Signed   By: Carey Bullocks M.D.   On: 04/09/2023 17:14   DG Chest Port 1 View  Result Date: 04/09/2023 CLINICAL DATA:  Presyncope EXAM: PORTABLE CHEST 1 VIEW COMPARISON:  03/31/2023 and older. FINDINGS: Left upper chest battery pack for pacemaker leads along the right side of the heart. Film is rotated to left. Stable cardiopericardial silhouette. Small left effusion. Mild lung base opacity in the left. No pneumothorax chronic lung changes. Overlapping cardiac leads. Degenerative changes of the shoulders with possible right-sided full-thickness rotator cuff tear. IMPRESSION: Pacemaker.  Rotated radiograph.  Small left effusion and opacity. Chronic lung changes Electronically Signed   By: Karen Kays M.D.   On: 04/09/2023  14:57    Pending Labs Unresulted Labs (From admission, onward)     Start     Ordered   04/09/23 1516  MRSA Next Gen by PCR, Nasal  (MRSA Screening)  Once,   URGENT        04/09/23 1515   04/09/23 1505  Blood culture (routine x 2)  BLOOD CULTURE X 2,   R (with STAT occurrences)      04/09/23 1504   04/09/23 1202  Urinalysis, Routine w reflex microscopic -Urine, Clean Catch  Once,    URGENT       Question:  Specimen Source  Answer:  Urine, Clean Catch   04/09/23 1201            Vitals/Pain Today's Vitals   04/09/23 1245 04/09/23 1512 04/09/23 1517 04/09/23 1530  BP: 128/78  106/88 106/75  Pulse: 72  75 85  Resp: 20  20 16   Temp:   98.5 F (36.9 C)   TempSrc:   Oral   SpO2: 100%  98% 99%  Weight:      Height:      PainSc:  5  5      Isolation Precautions No active isolations  Medications Medications  potassium chloride 10 mEq in 100 mL IVPB (10 mEq Intravenous New Bag/Given 04/09/23 1505)  sodium chloride 0.9 % bolus 1,000 mL (0 mLs Intravenous Hold 04/09/23 1442)  vancomycin (VANCOREADY) IVPB 1500 mg/300 mL (1,500 mg Intravenous New Bag/Given 04/09/23 1639)  sodium chloride 0.9 % bolus 500 mL (0 mLs Intravenous Stopped 04/09/23 1403)  iohexol (OMNIPAQUE) 350 MG/ML injection 75 mL (75 mLs Intravenous Contrast Given 04/09/23 1420)  ceFEPIme (MAXIPIME) 2 g in sodium chloride 0.9 % 100 mL IVPB (0 g Intravenous Stopped 04/09/23 1702)  metroNIDAZOLE (FLAGYL) IVPB 500 mg (500 mg Intravenous New Bag/Given 04/09/23 1611)    Mobility walks     Focused Assessments Cardiac Assessment Handoff:    Lab Results  Component Value Date   CKTOTAL CANCELED 01/02/2021   CKMB CANCELED 01/02/2021   TROPONINI <0.03 03/28/2018   No results found for: "DDIMER" Does the Patient currently have chest pain? Yes  Pt reports a "pressure" to chest. Have taken a 12 lead and advised the EDP. , Neuro Assessment Handoff:  Swallow screen pass? Yes          Neuro Assessment:   Neuro Checks:      Has TPA been given? No If patient is a Neuro Trauma and patient is going to OR before floor call report to 4N Charge nurse: [phone] or [phone]  , Renal Assessment Handoff:  Hemodialysis Schedule:  Last Hemodialysis date and time:    Restricted appendage:   , Pulmonary Assessment Handoff:  Lung sounds: L Breath Sounds: Clear R Breath Sounds: Clear O2 Device:  Nasal Cannula O2 Flow Rate (L/min): 3 L/min    R Recommendations: See Admitting Provider Note  Report given to:   Additional Notes: very pleasant and kind. Pt reports previous admin to [place] due to sepsis. Meds have been given.

## 2023-04-09 NOTE — ED Notes (Signed)
Per EDP were holding normal saline bolus, patient receiving potassium runs with of normal saline carrier fluids at 149ml/hr. Will reassess to see if bolus is needed after the potassium runs.

## 2023-04-09 NOTE — ED Notes (Signed)
EDP notified of critical lab values.    Potassium 2.5

## 2023-04-09 NOTE — Patient Instructions (Signed)
Visit Information  Thank you for taking time to visit with me today. Please don't hesitate to contact me if I can be of assistance to you.   Following are the goals we discussed today:   Goals Addressed             This Visit's Progress    Obtain Supportive Resources-Housing   On track    Activities and task to complete in order to accomplish goals.   Keep all upcoming appointments discussed today Continue with compliance of taking medication prescribed by Doctor Implement healthy coping skills discussed to assist with management of symptoms Use housing resources/websites discussed to review local options         Our next appointment is by telephone on 12/2 at 10:30 AM  Please call the care guide team at [phone] if you need to cancel or reschedule your appointment.   If you are experiencing a Mental Health or Behavioral Health Crisis or need someone to talk to, please call the Suicide and Crisis Lifeline: 988 call 911   Patient verbalizes understanding of instructions and care plan provided today and agrees to view in MyChart. Active MyChart status and patient understanding of how to access instructions and care plan via MyChart confirmed with patient.     Jenel Lucks, MSW, LCSW [facility] Care Management [place]  [facility] [email or web address] Phone [phone] 5:21 AM

## 2023-04-09 NOTE — ED Notes (Signed)
X-ray at bedside

## 2023-04-09 NOTE — Progress Notes (Addendum)
ED Pharmacy Antibiotic Sign Off An antibiotic consult was received from an ED provider for vancomycin per pharmacy dosing for pneumonia. A chart review was completed to assess appropriateness.  A single dose of cefepime 2000 mg was placed by the ED provider.   The following one time order(s) were placed per pharmacy consult:  vancomycin 1500 mg x 1 dose MRSA PCR  Further antibiotic and/or antibiotic pharmacy consults should be ordered by the admitting provider if indicated.   Thank you for allowing pharmacy to be a part of this patient's care.   Delmar Landau, PharmD, BCPS 04/09/2023 3:15 PM ED Clinical Pharmacist -  [phone]

## 2023-04-09 NOTE — Progress Notes (Signed)
Pharmacy Antibiotic Note  Kathleen [name] is a 77 y.o. female for which pharmacy has been consulted for cefepime dosing for  IAI .  Estimated Creatinine Clearance: 44.8 mL/min (by C-G formula based on SCr of 1 mg/dL). WBC 20.9; LA 2.9; T 98.5; HR 70; RR 13  Vancomycin x 1 given in the ED 11/1  Plan: Metronidazole per MD Cefepime 2g q12hr  Monitor WBC, fever, renal function, cultures De-escalate when able  Height: 5' (152.4 cm) Weight: 80 kg (176 lb 5.9 oz) IBW/kg (Calculated) : 45.5  Temp (24hrs), Avg:98.2 F (36.8 C), Min:97.8 F (36.6 C), Max:98.5 F (36.9 C)  Recent Labs  Lab 04/04/23 0703 04/04/23 1403 04/09/23 1212 04/09/23 1413 04/09/23 1554  WBC 12.2*  --  20.9*  --   --   CREATININE 0.86 0.56 1.14* 1.00  --   LATICACIDVEN  --   --   --  2.8* 2.9*    Estimated Creatinine Clearance: 44.8 mL/min (by C-G formula based on SCr of 1 mg/dL).    Allergies  Allergen Reactions   Allopurinol Other (See Comments)    Reaction:  Dizziness    Clindamycin Anaphylaxis and Hives   Flublok [Influenza Vaccine Recombinant] Other (See Comments)    Fever 103 with no alternative explanation day after vaccine.  Clydie Braun Highfill FNP-C   Pneumococcal 13-Val Conj Vacc Itching, Swelling and Rash   Dronedarone Rash   Montelukast Other (See Comments)    Makes her loopy.   Brovana [Arformoterol]     Caused muscle pain   Budesonide     Caused extreme joint pain   Entresto [Sacubitril-Valsartan] Other (See Comments)    hypotension   Fosamax [Alendronate Sodium] Nausea Only   Jardiance [Empagliflozin]     Caused a vaginal infection   Meperidine Nausea And Vomiting   Microplegia Msa-Msg [Plegisol]     Loopy,diarrhea   Rosuvastatin Other (See Comments)    Reaction:  Muscle spasms    Tetracycline Hives   Adhesive [Tape] Rash   Lovastatin Rash and Other (See Comments)    Muscle Pain   Microbiology results: Pending  Thank you for allowing pharmacy to be a part of this patient's  care.  Delmar Landau, PharmD, BCPS 04/09/2023 7:15 PM ED Clinical Pharmacist -  [phone]

## 2023-04-09 NOTE — ED Provider Notes (Signed)
[place] EMERGENCY DEPARTMENT AT [facility] Provider Note   CSN: [id number] Arrival date & time: 04/09/23  1101     History {Add pertinent medical, surgical, social history, OB history to HPI:1} Chief Complaint  Patient presents with   multiple complaints     Kathleen [name] is a 77 y.o. female.  HPI 77 year old female history of COPD with chronic hypoxic respiratory failure on baseline 5 L, OSA on BiPAP, HFrEF, A-fib status post AV node ablation, chronic anticoagulation with Xarelto, known colovaginal fistula, hypertension presenting for multiple concerns.  Patient is here with her son.  She was discharged on Sunday.  Monday she was not feeling very well.  She felt better on Tuesday but since then she has had gradually worsening symptoms.  She has had predominantly dizziness and presyncope.  When she stands up she feels off balance and like she is going to pass out.  Improves when she sits down.  She has had some occasional chest pressure as well.  Nonexertional.  Occasional nausea.  No fevers or chills.  She is not feel short of breath at all physic her breathing is much improved.  She is on doxycycline as well as Flagyl for diverticulitis and her recent COPD exacerbation.  She feels like her abdominal pain is worse in the left lower quadrant today, also slightly worsened diarrhea but no bleeding.  No fevers.  She is concerned that she is dehydrated.  She is been eating and drinking but is taking 3 torsemide pills a day and was called by her cardiologist yesterday and told to take a dose of metolazone which she took.  She feels like her lower extremity swelling is resolved and she wonders if she is just too dehydrated from all of her diuretics.     Home Medications Prior to Admission medications   Medication Sig Start Date End Date Taking? Authorizing Provider  acetaminophen (TYLENOL) 650 MG CR tablet Take 1,300 mg by mouth every 8 (eight) hours as needed for pain.     [provider]  azelastine (ASTELIN) 0.1 % nasal spray Place 2 sprays into both nostrils 2 (two) times daily. 11/05/22   Oretha Milch, MD  chlorpheniramine-HYDROcodone (TUSSIONEX) 10-8 MG/5ML Take 5 mLs by mouth every 12 (twelve) hours as needed for cough. Patient not taking: Reported on 04/08/2023 04/04/23   Almon Hercules, MD  Cholecalciferol (VITAMIN D3) 50 MCG (2000 UT) TABS Take 2,000 Units by mouth at bedtime.    [provider]  cyanocobalamin (VITAMIN B12) 1000 MCG/ML injection Inject 1 mL (1,000 mcg total) into the muscle every 30 (thirty) days. 05/05/22   [name], MD  docusate sodium (COLACE) 100 MG capsule Take 100 mg by mouth 2 (two) times daily.    [provider]  dofetilide (TIKOSYN) 250 MCG capsule Take 1 capsule (250 mcg total) by mouth in the morning and at bedtime. 12/01/22 12/01/23  Fenton, Clint R, PA  doxycycline (VIBRA-TABS) 100 MG tablet Take 1 tablet (100 mg total) by mouth every 12 (twelve) hours for 7 days. 04/04/23 04/11/23  Almon Hercules, MD  febuxostat (ULORIC) 40 MG tablet TAKE 1 TABLET(40 MG) BY MOUTH DAILY 04/05/23   Rice, Jamesetta Orleans, MD  fluticasone furoate-vilanterol (BREO ELLIPTA) 100-25 MCG/ACT AEPB INHALE 1 PUFF INTO THE LUNGS DAILY 03/23/23   Oretha Milch, MD  gabapentin (NEURONTIN) 100 MG capsule Take 2 capsules (200 mg total) by mouth at bedtime. 11/03/22 06/01/23  Windell Norfolk, MD  guaiFENesin (  MUCINEX) 600 MG 12 hr tablet Take 1 tablet (600 mg total) by mouth 2 (two) times daily for 5 days. 04/04/23 04/09/23  Almon Hercules, MD  levalbuterol (XOPENEX) 0.63 MG/3ML nebulizer solution Take 3 mLs (0.63 mg total) by nebulization every 6 (six) hours as needed for wheezing or shortness of breath. DX J44.89 J96.21 01/20/23   Oretha Milch, MD  Magnesium 500 MG TABS Take 500 mg by mouth at bedtime.    [provider]  metolazone (ZAROXOLYN) 2.5 MG tablet Take 1 tablet (2.5 mg total) by mouth once a week. MUST TAKE OF  POTASSIUM WITH THIS 03/24/23 06/22/23  Tonye Becket D, NP  metroNIDAZOLE (FLAGYL) 500 MG tablet Take 1 tablet (500 mg total) by mouth every 12 (twelve) hours for 7 days. 04/04/23 04/11/23  Almon Hercules, MD  Na Sulfate-K Sulfate-Mg Sulf (SUPREP BOWEL PREP KIT) 17.5-3.13-1.6 GM/177ML SOLN Take 1 kit by mouth as directed. For colonoscopy prep Patient not taking: Reported on 04/07/2023 03/17/23   Mansouraty, Netty Starring., MD  nystatin (MYCOSTATIN) 100000 UNIT/ML suspension Take 5 mLs (500,000 Units total) by mouth 4 (four) times daily. 04/04/23   Almon Hercules, MD  OXYGEN Inhale 5 L into the lungs continuous. Use with resmed ventilator    [provider]  pantoprazole (PROTONIX) 40 MG tablet Take 1 tablet (40 mg total) by mouth daily. 04/05/23   Almon Hercules, MD  Plecanatide (TRULANCE) 3 MG TABS Take 1 tablet (3 mg total) by mouth daily. 04/06/23   Mansouraty, Netty Starring., MD  potassium chloride SA (KLOR-CON M) 20 MEQ tablet Take two tablets daily for the next two days, then take 2 tablets (40 mEq total) with weekly Metolazone dosage. Patient taking differently: take 2 tablets (40 mEq total) with weekly Metolazone dosage. 04/04/23   Almon Hercules, MD  predniSONE (DELTASONE) 10 MG tablet Take 4 tablets (40 mg total) by mouth daily for 2 days, THEN 3 tablets (30 mg total) daily for 2 days, THEN 3 tablets (30 mg total) daily for 2 days, THEN 2 tablets (20 mg total) daily for 2 days, THEN 1 tablet (10 mg total) daily for 2 days, THEN 0.5 tablets (5 mg total) daily for 2 days. 04/04/23 04/16/23  Almon Hercules, MD  revefenacin (YUPELRI) 175 MCG/3ML nebulizer solution Take 175 mcg by nebulization daily.    [provider]  rivaroxaban (XARELTO) 20 MG TABS tablet Take 1 tablet (20 mg total) by mouth daily with supper. 02/01/23   Bensimhon, [name], MD  torsemide (DEMADEX) 20 MG tablet Take 3 tablets (60 mg total) by mouth daily. Patient taking differently: Take 40 mg by mouth daily. 03/24/23 06/22/23   Tonye Becket D, NP      Allergies    Allopurinol, Clindamycin, Flublok [influenza vaccine recombinant], Pneumococcal 13-val conj vacc, Dronedarone, Montelukast, Brovana [arformoterol], Budesonide, Entresto [sacubitril-valsartan], Fosamax [alendronate sodium], Jardiance [empagliflozin], Meperidine, Microplegia msa-msg [plegisol], Rosuvastatin, Tetracycline, Adhesive [tape], and Lovastatin    Review of Systems   Review of Systems Review of systems completed and notable as per HPI.  ROS otherwise negative.   Physical Exam Updated Vital Signs BP 134/82   Pulse 74   Temp 97.8 F (36.6 C) (Oral)   Resp 16   Ht 5' (1.524 m)   Wt 80 kg   SpO2 99%   BMI 34.44 kg/m  Physical Exam Vitals and nursing note reviewed.  Constitutional:      General: She is not in acute distress.  Appearance: She is well-developed.  HENT:     Head: Normocephalic and atraumatic.     Mouth/Throat:     Mouth: Mucous membranes are dry.     Pharynx: Oropharynx is clear.  Eyes:     Extraocular Movements: Extraocular movements intact.     Conjunctiva/sclera: Conjunctivae normal.     Pupils: Pupils are equal, round, and reactive to light.  Cardiovascular:     Rate and Rhythm: Normal rate and regular rhythm.     Pulses: Normal pulses.     Heart sounds: Normal heart sounds. No murmur heard. Pulmonary:     Effort: Pulmonary effort is normal. No respiratory distress.     Breath sounds: Normal breath sounds.  Abdominal:     Palpations: Abdomen is soft.     Tenderness: There is abdominal tenderness. There is no guarding or rebound.     Comments: Left lower quadrant tenderness  Musculoskeletal:        General: No swelling.     Cervical back: Neck supple.     Right lower leg: No edema.     Left lower leg: No edema.  Skin:    General: Skin is warm and dry.     Capillary Refill: Capillary refill takes less than 2 seconds.  Neurological:     General: No focal deficit present.     Mental Status: She is alert and  oriented to person, place, and time. Mental status is at baseline.  Psychiatric:        Mood and Affect: Mood normal.     ED Results / Procedures / Treatments   Labs (all labs ordered are listed, but only abnormal results are displayed) Labs Reviewed - No data to display  EKG None  Radiology No results found.  Procedures Procedures  {Document cardiac monitor, telemetry assessment procedure when appropriate:1}  Medications Ordered in ED Medications - No data to display  ED Course/ Medical Decision Making/ A&P Clinical Course as of 04/09/23 1614  Fri Apr 09, 2023  1307 [employer]: atrial arrhythmia last October 31 just under 2 minutes, looks volume down [JD]  1602 Assumed care from Dr Earlene Plater. 77 yo F with hx of HFrEF, COPD on 5L O2, AF on xarelto, colovaginal fistula with recent diverticulitis and PNA and is still on abx.  [RP]    Clinical Course User Index [JD] Laurence Spates, MD [RP] Rondel Baton, MD   {   Click here for ABCD2, HEART and other calculatorsREFRESH Note before signing :1}                              Medical Decision Making Amount and/or Complexity of Data Reviewed Labs: ordered. Radiology: ordered.  Risk Prescription drug management.   Medical Decision Making:   TRAEH MILROY is a 77 y.o. female who presented to the ED today with presyncope, generalized weakness, fatigue, some chest pain.  Vital signs reviewed.  She is on her baseline oxygen, she feels like her breathing is actually much improved from prior and has clear lungs bilaterally.  Lower suspicion for pneumonia or COPD exacerbation.  She is predominantly describing presyncope and dizziness with standing.  She has been taking her diuretic and recently took metolazone yesterday, and she feels like this is only worsening her symptoms.  She has not had any syncope or trauma.  Occasional chest pain as well although atypical, nonexertional.  EKG shows left bundle branch block, no ischemic  changes from prior and no active chest pain lower concern for ACS will obtain delta troponin.  She does have some abdominal pain and tenderness currently on antibiotics, obtain CT scan to rule out abscess or other complication such as perforation.  Does not appear to be septic.  I did bedside ultrasound, somewhat difficult to assess the EF although appears normal to slightly decreased.  Her IVC actually appears quite diminished, and she looks extravascular dry on exam I suspect she may be dehydrated from recent diuresis.  Will trial some fluids.   {crccomplexity:27900} Reviewed and confirmed nursing documentation for past medical history, family history, social history.  Initial Study Results:   Laboratory  All laboratory results reviewed.  Labs notable for ***  ***EKG EKG was reviewed independently. Rate, rhythm, axis, intervals all examined and without medically relevant abnormality. ST segments without concerns for elevations.    Radiology:  All images reviewed independently. ***Agree with radiology report at this time.      Consults: Case discussed with ***.   Reassessment and Plan:   Patient feeling slightly better after fluids.  Lab work is notable for significant hypokalemia and anion gap elevation although bicarb is normal.  Lactic acid mildly elevated although not dissimilar from her recent admission.  She has significant leukocytosis as well concern for possible infection.  Chest x-ray read with possible pneumonia although no clinical symptoms.  CT of the abdomen is pending.  She is received approximate liter of fluids, I am not giving a full 30 cc/kg given history of significant heart failure and recent volume overload.  Her Medtronic evaluation showed an episode of atrial arrhythmia yesterday, and is more consistent with low volume status clinically.  CT scan is pending for evaluation of her abdominal pain and source of infection.  Consider possible C. difficile although she has not  had significantly increased diarrhea no episodes of diarrhea here.   Patient's presentation is most consistent with {EM COPA:27473}     {Document critical care time when appropriate:1} {Document review of labs and clinical decision tools ie heart score, Chads2Vasc2 etc:1}  {Document your independent review of radiology images, and any outside records:1} {Document your discussion with family members, caretakers, and with consultants:1} {Document social determinants of health affecting pt's care:1} {Document your decision making why or why not admission, treatments were needed:1} Final Clinical Impression(s) / ED Diagnoses Final diagnoses:  None    Rx / DC Orders ED Discharge Orders     None

## 2023-04-09 NOTE — ED Notes (Signed)
Patient back in room from CT.

## 2023-04-09 NOTE — ED Provider Notes (Signed)
  Physical Exam  BP 106/75   Pulse 85   Temp 98.5 F (36.9 C) (Oral)   Resp 16   Ht 5' (1.524 m)   Wt 80 kg   SpO2 99%   BMI 34.44 kg/m   Physical Exam  Procedures  Procedures  ED Course / MDM   Clinical Course as of 04/09/23 1815  Fri Apr 09, 2023  1307 [employer]tr[employer]: atrial arrhythmia last October 31 just under 2 minutes, looks volume down [JD]  1602 Assumed care from Dr Earlene Plater. 77 yo F with hx of HFrEF, COPD on 5L O2, AF on xarelto, colovaginal fistula with recent diverticulitis and PNA and is still on abx presyncope has chronic diarrhea. Has been taking diuretics at home. Has low potassium. CXR with possible pna and got antibiotics. Getting CT of the abdomen. Will likely require admission.  [RP]  1758 CT ABDOMEN PELVIS W CONTRAST Shows diverticulitis with persistent colovaginal fistula [RP]  1814 Dr Gasper Sells from hospitalist to admit the patient [RP]    Clinical Course User Index [JD] Laurence Spates, MD [RP] Rondel Baton, MD   Medical Decision Making Amount and/or Complexity of Data Reviewed Labs: ordered. Radiology: ordered. Decision-making details documented in ED Course.  Risk Prescription drug management. Decision regarding hospitalization.     Rondel Baton, MD 04/09/23 [phone]

## 2023-04-09 NOTE — ED Notes (Signed)
Contacted transport.

## 2023-04-09 NOTE — ED Notes (Signed)
Patient assisted to the bedside commode.

## 2023-04-10 DIAGNOSIS — J8489 Other specified interstitial pulmonary diseases: Secondary | ICD-10-CM | POA: Insufficient documentation

## 2023-04-10 DIAGNOSIS — E876 Hypokalemia: Secondary | ICD-10-CM | POA: Diagnosis not present

## 2023-04-10 LAB — BASIC METABOLIC PANEL
Anion gap: 12 (ref 5–15)
BUN: 23 mg/dL (ref 8–23)
CO2: 24 mmol/L (ref 22–32)
Calcium: 8.9 mg/dL (ref 8.9–10.3)
Chloride: 98 mmol/L (ref 98–111)
Creatinine, Ser: 0.93 mg/dL (ref 0.44–1.00)
GFR, Estimated: >60 mL/min (ref >60)
Glucose, Bld: 141 mg/dL — ABNORMAL HIGH (ref 70–99)
Potassium: 3.8 mmol/L (ref 3.5–5.1)
Sodium: 134 mmol/L — ABNORMAL LOW (ref 135–145)

## 2023-04-10 LAB — CBC
HCT: 39.6 % (ref 36.0–46.0)
Hemoglobin: 12.3 g/dL (ref 12.0–15.0)
MCH: 26.5 pg (ref 26.0–34.0)
MCHC: 31.1 g/dL (ref 30.0–36.0)
MCV: 85.3 fL (ref 80.0–100.0)
Platelets: 200 10*3/uL (ref 150–400)
RBC: 4.64 MIL/uL (ref 3.87–5.11)
RDW: 15.7 % — ABNORMAL HIGH (ref 11.5–15.5)
WBC: 14.3 10*3/uL — ABNORMAL HIGH (ref 4.0–10.5)
nRBC: 0 % (ref 0.0–0.2)

## 2023-04-10 LAB — MAGNESIUM: Magnesium: 2.4 mg/dL (ref 1.7–2.4)

## 2023-04-10 MED ORDER — POTASSIUM CHLORIDE 20 MEQ PO PACK
40.0000 meq | PACK | Freq: Two times a day (BID) | ORAL | Status: DC
Start: 2023-04-10 — End: 2023-04-10
  Administered 2023-04-10: 40 meq via ORAL
  Filled 2023-04-10: qty 2

## 2023-04-10 MED ORDER — TORSEMIDE 20 MG PO TABS
60.0000 mg | ORAL_TABLET | Freq: Every day | ORAL | Status: DC
  Administered 2023-04-11: 60 mg via ORAL
  Filled 2023-04-10 (×2): qty 3

## 2023-04-10 MED ORDER — POTASSIUM CHLORIDE CRYS ER 20 MEQ PO TBCR: 40.0000 meq | EXTENDED_RELEASE_TABLET | ORAL | Status: DC

## 2023-04-10 MED ORDER — SPIRONOLACTONE 25 MG PO TABS
25.0000 mg | ORAL_TABLET | Freq: Every day | ORAL | Status: DC
  Administered 2023-04-10 – 2023-04-12 (×3): 25 mg via ORAL
  Filled 2023-04-10 (×3): qty 1

## 2023-04-10 MED ORDER — METOLAZONE 2.5 MG PO TABS: 2.5000 mg | ORAL_TABLET | ORAL | Status: DC

## 2023-04-10 MED ORDER — POTASSIUM CHLORIDE 20 MEQ PO PACK
40.0000 meq | PACK | Freq: Every day | ORAL | Status: DC
  Administered 2023-04-11: 40 meq via ORAL
  Filled 2023-04-10: qty 2

## 2023-04-10 MED ORDER — POTASSIUM CHLORIDE 20 MEQ PO PACK
40.0000 meq | PACK | Freq: Once | ORAL | Status: AC
  Administered 2023-04-10: 40 meq via ORAL
  Filled 2023-04-10: qty 2

## 2023-04-10 NOTE — Hospital Course (Addendum)
Kathleen [name] is a 77 y.o. F with obesity, COPD, sCHF, BOOP, tracheobronchomalacia, chronic respiratory failure on 5L O2 at home, OSA on BiPAP at night, persAF s/p AVN ablation and PPM, as well as recurrent diverticulitis and colovaginal fistula just recently admitted for diverticulitis who presented with dizziness, weakness and worsening abdominal pain.  In the ER, K 2.3  See H&P from Dr. Gasper Sells for further details.

## 2023-04-10 NOTE — Assessment & Plan Note (Signed)
Resolving COPD flare - Continue steroid taper - Continue LAMA

## 2023-04-10 NOTE — Evaluation (Signed)
Physical Therapy Brief Evaluation and Discharge Note Patient Details Name: Kathleen [name] MRN: [id number] DOB: [birth year] Today's Date: 04/10/2023   History of Present Illness  The pt is a 77 yo female presenting with dizziness.  W/u revealed low potassium.   PMH includes: COPD on 4-5L O2, asthma, OSA on bipap, afib, CHF and diverticulitis  Clinical Impression  Patient present at baseline for mobility - modified independent with rolling walker (uses rollator at home).  I would recommend HHPT for general strengthening as patient reports she does not have the endurance/stamina she used to.  Feel patient safe from mobility standpoint.  PT will sign off.         PT Assessment All further PT needs can be met in the next venue of care  Assistance Needed at Discharge  None    Equipment Recommendations None recommended by PT  Recommendations for Other Services       Precautions/Restrictions Precautions Precautions: Fall Restrictions Weight Bearing Restrictions: No        Mobility  Bed Mobility Rolling: Independent Supine/Sidelying to sit: Independent Sit to supine/sidelying: Independent    Transfers Overall transfer level: Modified independent Equipment used: Rolling walker (2 wheels) Transfers: Sit to/from Stand Sit to Stand: Modified independent (Device/Increase time)                Ambulation/Gait Ambulation/Gait assistance: Modified independent (Device/Increase time) Gait Distance (Feet): 150 Feet Assistive device: Rolling walker (2 wheels) Gait Pattern/deviations: Step-through pattern Gait Speed: Below normal General Gait Details: pt appears steady with RW, pt denies dyspnea; ambulated on 4L O2 [place] and SPO2 97%  Home Activity Instructions    Stairs            Modified Rankin (Stroke Patients Only)        Balance Overall balance assessment: No apparent balance deficits (not formally assessed)                        Pertinent  Vitals/Pain PT - Brief Vital Signs All Vital Signs Stable: Yes (O2 sat 97 during ambulation) Pain Assessment Pain Assessment: No/denies pain     Home Living Family/patient expects to be discharged to:: Private residence [facility] Living facility) Living Arrangements: Alone   Home Environment: Level entry   Home Equipment: Rollator (4 wheels);Cane - single point;Shower seat;Grab bars - tub/shower;Hand held shower head;Other (comment);Adaptive equipment   Additional Comments: [facility]. has shower chair, hand held shower head; walk in shower. elevators within the home.  Home O2 at 4L-5L on portable.    Prior Function Level of Independence: Independent with assistive device(s) Comments: uses rollator in community and no DME in apt. on 4-5 LO2 at baseline    UE/LE Assessment   UE ROM/Strength/Tone/Coordination: Generalized weakness    LE ROM/Strength/Tone/Coordination: Generalized weakness      Communication   Communication Communication: No apparent difficulties     Cognition Overall Cognitive Status: Appears within functional limits for tasks assessed/performed       General Comments      Exercises     Assessment/Plan    PT Problem List Decreased activity tolerance;Decreased mobility;Cardiopulmonary status limiting activity       PT Visit Diagnosis Difficulty in walking, not elsewhere classified (R26.2)    No Skilled PT All education completed;Patient at baseline level of functioning   Co-evaluation                AMPAC 6 Clicks Help needed turning from your back to  your side while in a flat bed without using bedrails?: None Help needed moving from lying on your back to sitting on the side of a flat bed without using bedrails?: None Help needed moving to and from a bed to a chair (including a wheelchair)?: None Help needed standing up from a chair using your arms (e.g., wheelchair or bedside chair)?: None Help needed to walk in hospital room?:  None Help needed climbing 3-5 steps with a railing? : A Little 6 Click Score: 23      End of Session   Activity Tolerance: Patient tolerated treatment well Patient left: in bed;with call bell/phone within reach Nurse Communication: Mobility status PT Visit Diagnosis: Difficulty in walking, not elsewhere classified (R26.2)     Time: 9147-8295 PT Time Calculation (min) (ACUTE ONLY): 15 min  Charges:   PT Evaluation $PT Eval Low Complexity: 1 Low      04/10/2023 Margie, PT Acute Rehabilitation Services Office:  [phone]   Olivia Canter  04/10/2023, 12:53 PM

## 2023-04-10 NOTE — Progress Notes (Signed)
  Progress Note   Patient: Kathleen [name] WUJ:[id number] DOB: 05-05-1946 DOA: 04/09/2023     1 DOS: the patient was seen and examined on 04/10/2023       Brief hospital course: Mrs. Gerst is a 77 y.o. F with obesity, COPD, sCHF, BOOP, tracheobronchomalacia, chronic respiratory failure on 5L O2 at home, OSA on BiPAP at night, persAF s/p AVN ablation and PPM, as well as recurrent diverticulitis and colovaginal fistula just recently admitted for diverticulitis who presented with dizziness, weakness and worsening abdominal pain.  In the ER, K 2.3  See H&P from Dr. Gasper Sells for further details.     Assessment and Plan: * Hypokalemia - Continue potassium daily - Double potassium dose on metolazone days  Diverticulitis I am uncertain if this is an active infection - Hold antibiotics for now - Monitor serial abdominal exams and CBC  Obesity (BMI 30-39.9) BMI 34  Acquired hypothyroidism No longer on levothyroxine  Stage 3a chronic kidney disease (HCC) Creatinine stable relative to baseline  Type 2 diabetes mellitus with diabetic neuropathy, without long-term current use of insulin (HCC) Glucoses okay not on insulin  Persistent atrial fibrillation (HCC) - Continue Tikosyn - Continue Xarelto  COPD (chronic obstructive pulmonary disease) (HCC) As resolving COPD flare - Continue LAMA - Continue steroid taper  Chronic systolic heart failure (HCC) - Continue home torsemide 60 daily - Continue metolazone weekly - Resume home spironolactone - Continue potassium - Discussed with Dr. Gala Romney  Chronic respiratory failure with hypoxia (HCC) - Continue home oxygen          Subjective: Patient still having left lower quadrant abdominal pain, still feeling dizzy.     Physical Exam: BP 123/63 (BP Location: Right Wrist)   Pulse 70   Temp 97.8 F (36.6 C) (Oral)   Resp 18   Ht 5' (1.524 m)   Wt 80 kg   SpO2 100%   BMI 34.44 kg/m   Elderly adult female, sitting  on the edge of the bed, interactive and appropriate RRR, no murmurs, no peripheral edema JVP normal Respiratory rate normal, lung sounds diminished but no rales or wheezes appreciated Judgment normal, affect normal, judgment insight appear normal There is marked tenderness in the left lower quadrant, with voluntary guarding    Data Reviewed: Basic metabolic panel and CBC reviewed Discussed with cardiology   Family Communication: None present    Disposition: Status is: Inpatient The patient has a CT imaging evidence of ongoing diverticulitis, and elevated white count.  Given her advanced cardiopulmonary disease, and that she lives alone, I believe that observation in the hospital, serial abdominal exams and laboratory monitoring is necessary to safely manage her possible flaring diverticulitis and that discharge would risk unsafe deterioration         Author: Alberteen Sam, MD 04/10/2023 5:48 PM  For on call review [email or web address].

## 2023-04-10 NOTE — Assessment & Plan Note (Addendum)
Hypomagnesemia Hypokalemia has worsened in the last month as Cardiology have been titrating up torsemide and metolazone (was on torsemide 20 daily and PRN metolazone in August, since then titrated up to torsemide 60 and weekly metolazone)  Hypokalemia problematic with maintenance dofetilide K drifted down to 3.0 today despite Kdur 40 BID yesterday - Potassium 40 TID today - Magnesium 4g IV - Consult HF team re: how to manage dofetilide vs diuretics vs hypokalemia

## 2023-04-10 NOTE — Assessment & Plan Note (Signed)
-   Continue Tikosyn - Continue Xarelto

## 2023-04-10 NOTE — Assessment & Plan Note (Signed)
Glucoses okay not on insulin

## 2023-04-10 NOTE — Assessment & Plan Note (Signed)
BMI 34 

## 2023-04-10 NOTE — Assessment & Plan Note (Signed)
Continue home oxygen 

## 2023-04-10 NOTE — Assessment & Plan Note (Signed)
No longer on levothyroxine

## 2023-04-10 NOTE — Assessment & Plan Note (Signed)
This is a separate issue from the hypokalemia.  Hard to know if she is having increased stool (and K loss) or just more aware of it because of the fecal discharge from vagina.  Discharged after last hospitalization with doxycycline/Flagyl.  Pain completely resolved.  Plan was to see Dr. Meridee Score in mid Nov for repeat CT, then have colonoscopy at 6-8 weeks then see Dr. Michaell Cowing colorectal surgery in December to discuss definitive treatment.  Her pain returned after stopping doxycycline/Flagyl and CT on admission here showed persistent bowel thickening, no new fluid collection  Here, she was given 1 dose Abx on admission, then antibiotics held.  Serial abdominal exams stable, mild to moderate tenderness/guarding.    I discussed with GI Dr. Lavon Paganini today.  There is uncertainty if she is failing outpatient management or if continued antibiotics will be effective.  In light of fact patient is apprehensive about her surgical risk and would prefer to see the colorectal surgery specialist prior to surgery, we agreed to trial oral antibiotics and follow up with Dr. Michaell Cowing as scheduled - Start Augmentin - Outpatient GI and Gen Surg follow up - I will call to see if Surgery follow up can be moved up

## 2023-04-10 NOTE — Assessment & Plan Note (Signed)
Creatinine stable relative to baseline 

## 2023-04-10 NOTE — Assessment & Plan Note (Signed)
Appears euvolemic - Torsemide, metolazone and spironolactone per Cardiology

## 2023-04-10 NOTE — Plan of Care (Signed)
  Problem: Education: Goal: Knowledge of General Education information will improve Description: Including pain rating scale, medication(s)/side effects and non-pharmacologic comfort measures Outcome: Completed/Met

## 2023-04-11 DIAGNOSIS — E876 Hypokalemia: Secondary | ICD-10-CM | POA: Diagnosis not present

## 2023-04-11 LAB — BASIC METABOLIC PANEL
Anion gap: 10 (ref 5–15)
BUN: 11 mg/dL (ref 8–23)
CO2: 24 mmol/L (ref 22–32)
Calcium: 9.9 mg/dL (ref 8.9–10.3)
Chloride: 101 mmol/L (ref 98–111)
Creatinine, Ser: 0.67 mg/dL (ref 0.44–1.00)
GFR, Estimated: >60 mL/min (ref >60)
Glucose, Bld: 140 mg/dL — ABNORMAL HIGH (ref 70–99)
Potassium: 3.3 mmol/L — ABNORMAL LOW (ref 3.5–5.1)
Sodium: 135 mmol/L (ref 135–145)

## 2023-04-11 LAB — CBC
HCT: 37.1 % (ref 36.0–46.0)
Hemoglobin: 11.6 g/dL — ABNORMAL LOW (ref 12.0–15.0)
MCH: 26.2 pg (ref 26.0–34.0)
MCHC: 31.3 g/dL (ref 30.0–36.0)
MCV: 83.9 fL (ref 80.0–100.0)
Platelets: 159 10*3/uL (ref 150–400)
RBC: 4.42 MIL/uL (ref 3.87–5.11)
RDW: 15.2 % (ref 11.5–15.5)
WBC: 10.3 10*3/uL (ref 4.0–10.5)
nRBC: 0 % (ref 0.0–0.2)

## 2023-04-11 MED ORDER — AQUAPHOR EX OINT
TOPICAL_OINTMENT | CUTANEOUS | Status: DC | PRN
  Filled 2023-04-11: qty 50

## 2023-04-11 MED ORDER — POTASSIUM CHLORIDE CRYS ER 20 MEQ PO TBCR
40.0000 meq | EXTENDED_RELEASE_TABLET | Freq: Once | ORAL | Status: AC
  Administered 2023-04-11: 40 meq via ORAL
  Filled 2023-04-11: qty 2

## 2023-04-11 NOTE — Progress Notes (Signed)
  Progress Note   Patient: Kathleen [name]:[id number] DOB: [birth year] DOA: 04/09/2023     2 DOS: the patient was seen and examined on 04/11/2023 at 9:55AM      Brief hospital course: Mrs. Crites is a 77 y.o. F with obesity, COPD, sCHF, BOOP, tracheobronchomalacia, chronic respiratory failure on 5L O2 at home, OSA on BiPAP at night, persAF s/p AVN ablation and PPM, as well as recurrent diverticulitis and colovaginal fistula just recently admitted for diverticulitis who presented with dizziness, weakness and worsening abdominal pain.  In the ER, K 2.3  See H&P from Dr. Gasper Sells for further details.     Assessment and Plan: * Hypokalemia K improved and symptoms resolved.  K still below target with Tikosyn - Supplement K, double dose today - Continue spironolactone - Plan at dc: Continue potassium daily, double potassium dose on metolazone days  Diverticulitis WBC better, still tender on exam, no change  Reporting vaginal bleeding and increased fecal output from vagina - Hold antibiotics for now - Monitor serial abdominal exams and CBC  Persistent atrial fibrillation (HCC) - Continue Tikosyn - Continue Xarelto  COPD (chronic obstructive pulmonary disease) (HCC) As resolving COPD flare - Continue LAMA - Continue steroid taper  Chronic systolic heart failure (HCC) - Continue home torsemide 60 daily - Continue metolazone weekly - Resume home spironolactone - Continue potassium  Chronic respiratory failure with hypoxia (HCC) - Continue home oxygen   Obesity (BMI 30-39.9) BMI 34  Acquired hypothyroidism No longer on levothyroxine  Stage 3a chronic kidney disease (HCC) Creatinine stable relative to baseline  Type 2 diabetes mellitus with diabetic neuropathy, without long-term current use of insulin (HCC) Glucoses okay not on insulin        Subjective: Vaginal bleeding overnight.  None now.  Increased diarrha and vaginal feces.  No fever, no change in  abdominal pain.  Dizziness better.  No nursing concerns     Physical Exam: BP 122/65 (BP Location: Right Wrist)   Pulse 71   Temp 97.8 F (36.6 C)   Resp 18   Ht 5' (1.524 m)   Wt 80 kg   SpO2 97%   BMI 34.44 kg/m   Adult female, lying in bed, no acute distress RRR no rmurmurs no LE edema, JVP nornal Respiratory rate normal, lungs clear without rales or wheezes Abdomen with tenderness, and a lot of guarding in the left lower quadrant, no change from yesterday Attention normal, affect normal, judgment and insight appear normal Vaginal exam with chaperone present, showed no bleeding, raw skin around the vagina exteriorly, soft brown feces from the vagina.    Data Reviewed: Basic metabolic panel shows potassium 3.3, creatinine stable CBC shows leukocytosis resolved  Family Communication:     Disposition: Status is: Inpatient As discussed yesterday, given the patient's advanced cardiopulmonary disease, ongoing abdominal pain despite adequate course of antibiotics, I plan to monitor in the hospital, for serial abdominal exams, resolution of hypokalemia, prior to discharge home        Author: Alberteen Sam, MD 04/11/2023 3:08 PM  For on call review [email or web address].

## 2023-04-12 ENCOUNTER — Other Ambulatory Visit: Payer: Medicare Other | Admitting: Internal Medicine

## 2023-04-12 ENCOUNTER — Telehealth (HOSPITAL_COMMUNITY): Payer: Self-pay | Admitting: Licensed Clinical Social Worker

## 2023-04-12 DIAGNOSIS — I4819 Other persistent atrial fibrillation: Secondary | ICD-10-CM | POA: Diagnosis not present

## 2023-04-12 DIAGNOSIS — I5022 Chronic systolic (congestive) heart failure: Secondary | ICD-10-CM | POA: Diagnosis not present

## 2023-04-12 DIAGNOSIS — E876 Hypokalemia: Secondary | ICD-10-CM | POA: Diagnosis not present

## 2023-04-12 LAB — BASIC METABOLIC PANEL
Anion gap: 14 (ref 5–15)
Anion gap: 15 (ref 5–15)
BUN: 20 mg/dL (ref 8–23)
BUN: 21 mg/dL (ref 8–23)
CO2: 29 mmol/L (ref 22–32)
CO2: 28 mmol/L (ref 22–32)
Calcium: 9.5 mg/dL (ref 8.9–10.3)
Calcium: 9.8 mg/dL (ref 8.9–10.3)
Chloride: 93 mmol/L — ABNORMAL LOW (ref 98–111)
Chloride: 93 mmol/L — ABNORMAL LOW (ref 98–111)
Creatinine, Ser: 0.88 mg/dL (ref 0.44–1.00)
Creatinine, Ser: 0.81 mg/dL (ref 0.44–1.00)
GFR, Estimated: >60 mL/min (ref >60)
GFR, Estimated: >60 mL/min (ref >60)
Glucose, Bld: 185 mg/dL — ABNORMAL HIGH (ref 70–99)
Glucose, Bld: 233 mg/dL — ABNORMAL HIGH (ref 70–99)
Potassium: 3 mmol/L — ABNORMAL LOW (ref 3.5–5.1)
Potassium: 3.7 mmol/L (ref 3.5–5.1)
Sodium: 136 mmol/L (ref 135–145)
Sodium: 136 mmol/L (ref 135–145)

## 2023-04-12 LAB — MAGNESIUM: Magnesium: 1.7 mg/dL (ref 1.7–2.4)

## 2023-04-12 MED ORDER — POTASSIUM CHLORIDE CRYS ER 20 MEQ PO TBCR
40.0000 meq | EXTENDED_RELEASE_TABLET | Freq: Three times a day (TID) | ORAL | Status: AC
  Administered 2023-04-12 (×2): 40 meq via ORAL
  Filled 2023-04-12 (×2): qty 2

## 2023-04-12 MED ORDER — TORSEMIDE 20 MG PO TABS
40.0000 mg | ORAL_TABLET | Freq: Every day | ORAL | Status: DC
  Administered 2023-04-13: 40 mg via ORAL
  Filled 2023-04-12: qty 2

## 2023-04-12 MED ORDER — AMOXICILLIN-POT CLAVULANATE 875-125 MG PO TABS
1.0000 | ORAL_TABLET | Freq: Two times a day (BID) | ORAL | Status: DC
  Administered 2023-04-12 – 2023-04-13 (×3): 1 via ORAL
  Filled 2023-04-12 (×4): qty 1

## 2023-04-12 MED ORDER — POTASSIUM CHLORIDE CRYS ER 20 MEQ PO TBCR
40.0000 meq | EXTENDED_RELEASE_TABLET | Freq: Every day | ORAL | Status: DC
  Administered 2023-04-13: 40 meq via ORAL
  Filled 2023-04-12: qty 2

## 2023-04-12 MED ORDER — MAGNESIUM SULFATE 2 GM/50ML IV SOLN
2.0000 g | Freq: Once | INTRAVENOUS | Status: DC
  Filled 2023-04-12: qty 50

## 2023-04-12 MED ORDER — POTASSIUM CHLORIDE CRYS ER 20 MEQ PO TBCR
40.0000 meq | EXTENDED_RELEASE_TABLET | Freq: Three times a day (TID) | ORAL | Status: DC
  Administered 2023-04-12: 40 meq via ORAL
  Filled 2023-04-12: qty 2

## 2023-04-12 MED ORDER — SPIRONOLACTONE 25 MG PO TABS
25.0000 mg | ORAL_TABLET | Freq: Two times a day (BID) | ORAL | Status: DC
  Administered 2023-04-12 – 2023-04-13 (×2): 25 mg via ORAL
  Filled 2023-04-12 (×2): qty 1

## 2023-04-12 MED ORDER — MAGNESIUM SULFATE 4 GM/100ML IV SOLN
4.0000 g | Freq: Once | INTRAVENOUS | Status: AC
Start: 2023-04-12 — End: 2023-04-12
  Administered 2023-04-12: 4 g via INTRAVENOUS
  Filled 2023-04-12: qty 100

## 2023-04-12 NOTE — Plan of Care (Signed)
  Problem: Health Behavior/Discharge Planning: Goal: Ability to manage health-related needs will improve Outcome: Progressing   Problem: Clinical Measurements: Goal: Ability to maintain clinical measurements within normal limits will improve Outcome: Progressing Goal: Will remain free from infection Outcome: Progressing Goal: Diagnostic test results will improve Outcome: Progressing   

## 2023-04-12 NOTE — Assessment & Plan Note (Signed)
-   CPAP at night °

## 2023-04-12 NOTE — Progress Notes (Signed)
Progress Note   Patient: Kathleen [name] MWU:[id number] DOB: May 24, 1946 DOA: 04/09/2023     3 DOS: the patient was seen and examined on 04/12/2023        Brief hospital course: Kathleen [name] is a 77 y.o. F with obesity, COPD, sCH EF 35-40% more recently recovered to 55%, BOOP, tracheobronchomalacia, chronic respiratory failure on 5L O2 at home, OSA on BiPAP at night, persAF s/p AVN ablation and PPM, as well as recurrent diverticulitis and colovaginal fistula just recently admitted for diverticulitis who presented with dizziness, weakness and worsening abdominal pain.   Admitted on K supplement and antibiotics.    8/7-8/13: A/w dyspnea diagnosed as COPD flare 9/16-9/19: A/w abdominal pain and fecal discharge from vagina, diagnosed with diverticulitis and new colovaginal fistula  10/19-10/27: A/w SOB, abdominal pain, again diagnosed with diverticulitis and recurrent COPD flare; K 2.7 on admission  11/1: Readmitted a fourth time; this time for weakness, K 2.3; CT still shows persistent bowel thickening        Assessment and Plan: * Hypokalemia Hypomagnesemia Hypokalemia has worsened in the last month as Cardiology have been titrating up torsemide and metolazone (was on torsemide 20 daily and PRN metolazone in August, since then titrated up to torsemide 60 and weekly metolazone)  Hypokalemia problematic with maintenance dofetilide K drifted down to 3.0 today despite Kdur 40 BID yesterday - Potassium 40 TID today - Magnesium 4g IV - Consult HF team re: how to manage dofetilide vs diuretics vs hypokalemia    Diverticulitis This is a separate issue from the hypokalemia.  Hard to know if she is having increased stool (and K loss) or just more aware of it because of the fecal discharge from vagina.  Discharged after last hospitalization with doxycycline/Flagyl.  Pain completely resolved.  Plan was to see Dr. Meridee Score in mid Nov for repeat CT, then have colonoscopy at 6-8 weeks then  see Dr. Michaell Cowing colorectal surgery in December to discuss definitive treatment.  Her pain returned after stopping doxycycline/Flagyl and CT on admission here showed persistent bowel thickening, no new fluid collection  Here, she was given 1 dose Abx on admission, then antibiotics held.  Serial abdominal exams stable, mild to moderate tenderness/guarding.    I discussed with GI Dr. Lavon Paganini today.  There is uncertainty if she is failing outpatient management or if continued antibiotics will be effective.  In light of fact patient is apprehensive about her surgical risk and would prefer to see the colorectal surgery specialist prior to surgery, we agreed to trial oral antibiotics and follow up with Dr. 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    Obesity (BMI 30-39.9) BMI 34  Acquired hypothyroidism No longer on levothyroxine  Stage 3a chronic kidney disease (HCC) Creatinine stable relative to baseline  Type 2 diabetes mellitus with diabetic neuropathy, without long-term current use of insulin (HCC) Glucoses okay not on insulin  Sleep apnea - CPAP at night  Persistent atrial fibrillation (HCC) - Continue Tikosyn - Continue Xarelto  COPD (chronic obstructive pulmonary disease) (HCC) Resolving COPD flare - Continue steroid taper - Continue LAMA  Chronic systolic heart failure (HCC) Appears euvolemic - Torsemide, metolazone  and spironolactone per Cardiology   Chronic respiratory failure with hypoxia (HCC) At baseline - Continue home oxygen          Subjective: pain no change, still kinda dizzy.  Cards will see today.  D/w GI, they  recommend outpatient treatment.     Physical Exam: BP (!) 148/88   Pulse 71   Temp 98.1 F (36.7 C) (Oral)   Resp 16   Ht 5' (1.524 m)   Wt 80.9 kg   SpO2 100%   BMI 34.84 kg/m   Adult female, lying in bed, no acute distress RRR no rmurmurs no LE edema, JVP nornal Respiratory rate normal, lungs clear without rales or wheezes Abdomen with tenderness, and a lot of guarding in the left lower quadrant, no change from yesterday Attention normal, affect normal, judgment and insight appear normal  Data Reviewed: BMP shows normal renal function, hypokalemia Discussed with GI and Cardiology  Family Communication:     Disposition: Status is: Inpatient         Author: Alberteen Sam, MD 04/12/2023 3:20 PM  For on call review [email or web address].

## 2023-04-12 NOTE — Consult Note (Addendum)
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   She was seen in the HF clinic 03/05/23. Dry at that time so diuretics held x2 days then torsemide was restarted at 20 mg daily with weekly metolazone.   On 03/17/23. Optivole suggestive of fluid accumulation. Torsemide was increased 40 mg daily and K Dure increased to 40 meq daily.   Admitted to [facility] by [facility] 03/26/23 with AECOPD/fatigue. Treated with antibiotics, steroids, and breathing treatments. GI/Pulmonary consulted. CT abdomen and pelvis on 10/24 with interval decrease in previously visualized free fluid anterior to the distal sigmoid colon and in the cul-de-sac and colovaginal fistula. GI recommended completing 14 days of antibiotics. Discharged 04/04/23 on a higher dose of torsemide 60 mg daily + metolazone once a week. Potassium was increased for 2 days then 40 meq daily on metolazone days.  Readmitted 04/09/23 with hypokalemia. K 2.5, creatinine 1.14, chloride 89, BNP 48 .  CXR chronic lung changes and small left pleural effsuon. CT abd/pelvis with colon diverticulitis and persistent colovaginal fistula. Completed antibiotic course. Diuretics held 11/1 and 11/2.    Yesterday torsemide 60 mg was restarted. Today she is complaining of dizziness when standing.   I checked orthostatics with his nurse. Sitting 142/92  Standing 148/88. She reported dizziness. Reports lots of loose stool.   Cardiac Studies - Echo 4/24 EF 35-40% RV ok  - Echo 10/23 and 11/23 EF  35-40% (Dr. Gala Romney felt 40-45%). Having episodes of CP that can wake her up from sleep. No exertional CP.   R/LHC (10/23): LAD 25% Cx 10%  PA 41/15 (28) PCWP 13 Fick 5.3/2.8   Home Medications Prior to Admission medications   Medication Sig Start Date End Date Taking? Authorizing Provider  acetaminophen (TYLENOL) 650 MG CR tablet Take 1,300 mg by mouth every 8 (eight) hours as needed for pain.   Yes [provider]  azelastine (ASTELIN) 0.1 % nasal spray Place 2 sprays into both nostrils 2 (two) times daily. 11/05/22  Yes Oretha Milch, MD  Cholecalciferol (VITAMIN D3) 50 MCG (2000 UT) TABS Take 2,000 Units by mouth at bedtime.   Yes [provider]  cyanocobalamin (VITAMIN B12) 1000 MCG/ML injection Inject 1 mL (1,000 mcg total) into the muscle every 30 (thirty) days. 05/05/22  Yes [name], MD  docusate sodium (COLACE) 100 MG capsule Take 100 mg by mouth 2 (two) times daily.   Yes [provider]  dofetilide (TIKOSYN) 250 MCG capsule Take 1 capsule (250 mcg total) by mouth in the morning and at bedtime. 12/01/22 12/01/23 Yes Fenton, Clint R, PA  febuxostat (ULORIC) 40 MG tablet TAKE 1 TABLET(40 MG) BY MOUTH DAILY 04/05/23  Yes Rice, Jamesetta Orleans, MD  fluticasone furoate-vilanterol (BREO ELLIPTA) 100-25 MCG/ACT AEPB INHALE 1 PUFF INTO THE LUNGS DAILY 03/23/23  Yes Oretha Milch, MD  gabapentin (NEURONTIN) 100 MG capsule Take 2 capsules (200 mg total) by mouth at bedtime. 11/03/22 06/01/23 Yes Windell Norfolk, MD  levalbuterol Pauline Aus) 0.63 MG/3ML nebulizer  solution Take 3 mLs (0.63 mg total) by nebulization every 6 (six) hours as needed for wheezing or shortness of breath. DX J44.89 J96.21 01/20/23  Yes Oretha Milch, MD  Magnesium 500 MG TABS Take 500 mg by mouth at bedtime.   Yes [provider]  metolazone (ZAROXOLYN) 2.5 MG tablet Take 1 tablet (2.5 mg total) by mouth once a week. MUST TAKE OF POTASSIUM WITH THIS 03/24/23 06/22/23 Yes Clegg, Amy  D, NP  nystatin (MYCOSTATIN) 100000 UNIT/ML suspension Take 5 mLs (500,000 Units total) by mouth 4 (four) times daily. 04/04/23  Yes Almon Hercules, MD  OXYGEN Inhale 5 L into the lungs continuous. Use with resmed ventilator   Yes [provider]  pantoprazole (PROTONIX) 40 MG tablet Take 1 tablet (40 mg total) by mouth daily. 04/05/23  Yes Almon Hercules, MD  potassium chloride SA (KLOR-CON M) 20 MEQ tablet Take two tablets daily for the next two days, then take 2 tablets (40 mEq total) with weekly Metolazone dosage. Patient taking differently: take 2 tablets (40 mEq total) with weekly Metolazone dosage. 04/04/23  Yes Almon Hercules, MD  predniSONE (DELTASONE) 10 MG tablet Take 4 tablets (40 mg total) by mouth daily for 2 days, THEN 3 tablets (30 mg total) daily for 2 days, THEN 3 tablets (30 mg total) daily for 2 days, THEN 2 tablets (20 mg total) daily for 2 days, THEN 1 tablet (10 mg total) daily for 2 days, THEN 0.5 tablets (5 mg total) daily for 2 days. 04/04/23 04/16/23 Yes Almon Hercules, MD  revefenacin (YUPELRI) 175 MCG/3ML nebulizer solution Take 175 mcg by nebulization daily.   Yes [provider]  rivaroxaban (XARELTO) 20 MG TABS tablet Take 1 tablet (20 mg total) by mouth daily with supper. 02/01/23  Yes Bensimhon, [name], MD  torsemide (DEMADEX) 20 MG tablet Take 3 tablets (60 mg total) by mouth daily. 03/24/23 06/22/23 Yes Clegg, Amy D, NP  Plecanatide (TRULANCE) 3 MG TABS Take 1 tablet (3 mg total) by mouth daily. 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    Concussion 10/04/2021   COPD (chronic obstructive pulmonary disease) (HCC)    Diverticulitis    Gout     Hypertension    Longstanding persistent atrial fibrillation (HCC)    on Xarelto   Nonischemic cardiomyopathy (HCC)    Obesity    Pacemaker    Spontaneous pneumothorax 2013    Past Surgical History: Past Surgical History:  Procedure Laterality Date   ABDOMINAL HYSTERECTOMY     APPENDECTOMY     ATRIAL FIBRILLATION ABLATION  07/20/2013   by Dr Christin Fudge   AV nodal ablation  11/01/2013   by Dr Christin Fudge, repeated by Dr Wilford Grist   BREAST BIOPSY Bilateral 1997   negative   CARDIAC CATHETERIZATION     CHOLECYSTECTOMY     HERNIA REPAIR     PACEMAKER INSERTION  06/2017   MDT Viva CRT-P implanted by Dr Christin Fudge after AV nodal ablation,  LV lead could not be placed   PACEMAKER INSERTION  05/2020   with lead bundle   RIGHT/LEFT HEART CATH AND CORONARY ANGIOGRAPHY N/A 03/20/2022  Procedure: RIGHT/LEFT HEART CATH AND CORONARY ANGIOGRAPHY;  Surgeon: [place], Peter M, MD;  Location: [facility] INVASIVE CV LAB;  Service: Cardiovascular;  Laterality: N/A;    Family History: Family History  Problem Relation Age of Onset   Breast cancer Mother 15       3 different times   Pancreatic cancer Mother    Emphysema Father    Healthy Brother    Breast cancer Cousin    Valvular heart disease Son    Obesity Daughter    Colon cancer Neg Hx    Esophageal cancer Neg Hx    Stomach cancer Neg Hx    Inflammatory bowel disease Neg Hx    Liver disease Neg Hx    Rectal cancer Neg Hx     Social History: Social History   Socioeconomic History   Marital status: Widowed    Spouse name: Not on file   Number of children: 1   Years of education: Not on file   Highest education level: Not on file  Occupational History   Occupation: retired  Tobacco Use   Smoking status: Former    Current packs/day: 0.00    Average packs/day: 1 pack/day for 20.0 years (20.0 ttl pk-yrs)    Types: Cigarettes    Start date: 06/09/1967    Quit date: 06/09/1987    Years since quitting: 35.8    Passive exposure: Past   Smokeless  tobacco: Never   Tobacco comments:    Former smoker 12/25/21  Vaping Use   Vaping status: Never Used  Substance and Sexual Activity   Alcohol use: No    Alcohol/week: 0.0 standard drinks of alcohol   Drug use: No   Sexual activity: Not Currently  Other Topics Concern   Not on file  Social History Narrative   Pt lives in [place] alone.  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  Her son works in [employer].   Social Determinants of Health   Financial Resource Strain: Low Risk  (05/21/2022)   Overall Financial Resource Strain (CARDIA)    Difficulty of Paying Living Expenses: Not very hard  Food Insecurity: No Food Insecurity (04/09/2023)   Hunger Vital Sign    Worried About Running Out of Food in the Last Year: Never true    Ran Out of Food in the Last Year: Never true  Transportation Needs: No Transportation Needs (04/09/2023)   PRAPARE - Administrator, Civil Service (Medical): No    Lack of Transportation (Non-Medical): No  Physical Activity: Insufficiently Active (05/21/2022)   Exercise Vital Sign    Days of Exercise per Week: 2 days    Minutes of Exercise per Session: 20 min  Stress: No Stress Concern Present (05/21/2022)   [employer] of Occupational Health - Occupational Stress Questionnaire    Feeling of Stress : Only a little  Social Connections: Moderately Isolated (05/21/2022)   Social Connection and Isolation Panel [NHANES]    Frequency of Communication with Friends and Family: More than three times a week    Frequency of Social Gatherings with Friends and Family: More than three times a week    Attends Religious Services: Never    Database administrator or Organizations: Yes    Attends Engineer, structural: More than 4 times per year    Marital Status: Widowed    Allergies:  Allergies  Allergen Reactions   Allopurinol Other (See Comments)    Reaction:  Dizziness  Clindamycin Anaphylaxis and Hives   Flublok [Influenza  Vaccine Recombinant] Other (See Comments)    Fever 103 with no alternative explanation day after vaccine.  Clydie Braun Highfill FNP-C   Pneumococcal 13-Val Conj Vacc Itching, Swelling and Rash   Dronedarone Rash   Montelukast Other (See Comments)    Makes her loopy.   Brovana [Arformoterol]     Caused muscle pain   Budesonide     Caused extreme joint pain   Entresto [Sacubitril-Valsartan] Other (See Comments)    hypotension   Fosamax [Alendronate Sodium] Nausea Only   Jardiance [Empagliflozin]     Caused a vaginal infection   Meperidine Nausea And Vomiting   Microplegia Msa-Msg [Plegisol]     Loopy,diarrhea   Rosuvastatin Other (See Comments)    Reaction:  Muscle spasms    Tetracycline Hives   Adhesive [Tape] Rash   Lovastatin Rash and Other (See Comments)    Muscle Pain    Objective:    Vital Signs:   Temp:  [97.8 F (36.6 C)-98 F (36.7 C)] 97.8 F (36.6 C) (11/04 0533) Pulse Rate:  [70-73] 71 (11/04 0533) Resp:  [18] 18 (11/03 2145) BP: (131-135)/(66-74) 131/71 (11/04 0533) SpO2:  [99 %-100 %] 99 % (11/04 0742) Last BM Date : 04/12/23  Weight change: Filed Weights   04/09/23 1121  Weight: 80 kg    Intake/Output:   Intake/Output Summary (Last 24 hours) at 04/12/2023 0929 Last data filed at 04/12/2023 0814 Gross per 24 hour  Intake 220 ml  Output 0 ml  Net 220 ml      Physical Exam    General:  Sitting on the side of the bed. No  resp difficulty HEENT: normal Neck: supple. JVP dose not appear elevated. . Carotids 2+ bilat; no bruits. No lymphadenopathy or thyromegaly appreciated. Cor: PMI nondisplaced. Regular rate & rhythm. No rubs, gallops or murmurs. Lungs: Crackles in the bases on 4 liters [place].  Abdomen: soft, nontender, nondistended. No hepatosplenomegaly. No bruits or masses. Good bowel sounds. Extremities: no cyanosis, clubbing, rash, edema Neuro: alert & orientedx3, cranial nerves grossly intact. moves all 4 extremities w/o difficulty. Affect  pleasant   Telemetry   I reordered telemetry   EKG  AV paced QRS 130 Kathleen QT 430 Kathleen/ QTc 464 Kathleen  Labs   Basic Metabolic Panel: Recent Labs  Lab 04/09/23 1212 04/09/23 1402 04/09/23 1413 04/09/23 2041 04/10/23 0420 04/11/23 0431 04/12/23 0428  NA 136  --  135  --  134* 135 136  K 2.5*  --  2.3* 3.6 3.8 3.3* 3.0*  CL 89*  --  89*  --  98 101 93*  CO2 26  --   --   --  24 24 29   GLUCOSE 192*  --  206*  --  141* 140* 185*  BUN 28*  --  27*  --  23 11 20   CREATININE 1.14*  --  1.00  --  0.93 0.67 0.88  CALCIUM 8.9  --   --   --  8.9 9.9 9.5  MG  --  2.3  --  2.3 2.4  --  1.7    Liver Function Tests: Recent Labs  Lab 04/09/23 1212  AST 20  ALT 14  ALKPHOS 37*  BILITOT 1.2  PROT 6.6  ALBUMIN 4.0   Recent Labs  Lab 04/09/23 1212  LIPASE 58*   No results for input(s): "AMMONIA" in the last 168 hours.  CBC: Recent Labs  Lab 04/09/23 1212 04/09/23 1413  04/10/23 0420 04/11/23 0431  WBC 20.9*  --  14.3* 10.3  NEUTROABS 18.2*  --   --   --   HGB 13.8 14.6 12.3 11.6*  HCT 43.9 43.0 39.6 37.1  MCV 83.1  --  85.3 83.9  PLT 283  --  200 159    Cardiac Enzymes: No results for input(s): "CKTOTAL", "CKMB", "CKMBINDEX", "TROPONINI" in the last 168 hours.  BNP: BNP (last 3 results) Recent Labs    03/26/23 2016 04/02/23 0703 04/09/23 1212  BNP 48.5 71.5 48.4    ProBNP (last 3 results) No results for input(s): "PROBNP" in the last 8760 hours.   CBG: No results for input(s): "GLUCAP" in the last 168 hours.  Coagulation Studies: No results for input(s): "LABPROT", "INR" in the last 72 hours.   Imaging   No results found.   Medications:     Current Medications:  docusate sodium  100 mg Oral BID   dofetilide  250 mcg Oral BID   febuxostat  40 mg Oral Daily   gabapentin  200 mg Oral QHS   guaiFENesin  600 mg Oral BID   magnesium oxide  400 mg Oral QHS   [START ON 04/15/2023] metolazone  2.5 mg Oral Weekly   nystatin  5 mL Oral QID    pantoprazole  40 mg Oral Daily   [START ON 04/15/2023] potassium chloride  40 mEq Oral Weekly   potassium chloride  40 mEq Oral TID   predniSONE  10 mg Oral Daily   Followed by   Melene Muller ON 04/14/2023] predniSONE  5 mg Oral Daily   revefenacin  175 mcg Nebulization Daily   rivaroxaban  20 mg Oral Q supper   spironolactone  25 mg Oral Daily   torsemide  60 mg Oral Daily    Infusions:  magnesium sulfate bolus IVPB     sodium chloride Stopped (04/09/23 1442)      Patient Profile   Kathleen Kennerson is a 77 year old with a history of morbid obesity, OSA on CPAP, BOOP tracheobronchmalacia, Chronic respiratory failure, colovesical fistula, 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  Admitted with hypokalemia and abd pain.    Assessment/Plan  1. Hypokalemia  -Supplement K . Increase spiro 25 mg twice a day.  - Give 4 grams Mag   2. Echo (8/22): EF 55%. RV ok  - Echo (10/23) and (11/23) EF 35-40%  - Echo 4/24 EF 35-40% RV ok  - R/L cath (10/23):  minimal CAD.  - Chronically NYHA III. Torsemide was restarted yesterday. Now with dizziness. On exam she does not appear volume overloaded. Difficult to manage volume with frequent output from fistula.  -Hold torsemide today.  -Increase spiro to 25 mg twice a day.  - Replace K and Mag.   3. Persistent  AF s/p AVN ablation and PM (failed upgrade to BiV)  On Tikosyn. Goal K >4 and Mag >2  K and Mag have been low. Place back on telemetry. Check EKG.  -Replace K. Increase spiro 25 mg twice a day  -Mag give 4 grams Mag now.   4.  Diverticulitis, colovesical fistula Antibiotics per [facility].   5. Chronic Hypoxic Respiratory Failure COPD/BOOP  On 4 liters [place].    Length of Stay: 3  Amy Clegg, NP  04/12/2023, 9:29 AM  Advanced Heart Failure Team Pager [phone] (M-F; 7a - 5p)  Please contact [facility] for night-coverage after hours (4p -7a )  and weekends on [email or web address]   Patient  seen with NP, agree with the above note.   Admitted with hypokalemia and weakness in the setting of ongoing output from colovesical fistula.  K is 3 today.  She remains on Tikosyn, QTc 464 msec today with left bundle pacing.  She is on her baseline 4L oxygen by nasal cannula.   General: NAD Neck: Thick, JVP difficult.  No thyromegaly or thyroid nodule.  Lungs: Decreased at bases.  CV: Nondisplaced PMI.  Heart regular S1/S2, no S3/S4, no murmur.  No peripheral edema.  No carotid bruit.  Normal pedal pulses.  Abdomen: Soft, nontender, no hepatosplenomegaly, no distention.  Skin: Intact without lesions or rashes.  Neurologic: Alert and oriented x 3.  Psych: Normal affect. Extremities: No clubbing or cyanosis.  HEENT: Normal.   1. Hypokalemia: In setting of aggressive diuresis + output from colovesical fistula.  Most recent K was 3.  - Replace K and Mg.  - spironolactone 25 mg bid.   - Hold torsemide today, start back on torsemide 40 mg daily tomorrow with KCl 40 bid.  Would hold off on metolazone at home for now.  2. Chronic systolic CHF: Nonischemic cardiomyopathy.  Last echo in 4/24 with EF 35-40%, RV normal.  Has AV nodal ablation with left bundle lead. She does not appear volume overloaded.  - Spironolactone and torsemide as above.  - Did not tolerate Entresto in past due to low BP, can start losartan tomorrow.  3. BOOP with tracheobronchomalacia: On 3-4 L oxygen at home, 4L [place] today.  4. Atrial fibrillation: Persistent.  Has had AV nodal ablation.  She is atrial pacing.  She is on Tikosyn. QTc on ECG today not significantly prolonged.  - Continue Tikosyn, need to keep K within normal range.  - Continue Xarelto.  5. Diverticulitis with colovesical fistula: Has significant stool output via fistula, loses K this way.  - Ultimately, needs surgical repair.   Marca Ancona 04/12/2023 3:02 PM

## 2023-04-12 NOTE — Telephone Encounter (Signed)
CSW consulted to speak with pt regarding new transportation concerns- unable to reach- left VM requesting return call  Burna Sis, LCSW Clinical Social Worker Advanced Heart Failure Clinic Desk#: [phone] Cell#: [phone]

## 2023-04-12 NOTE — TOC Initial Note (Signed)
Transition of Care [facility]) - Initial/Assessment Note    Patient Details  Name: Kathleen [name] MRN: [id number] Date of Birth: 1946/05/11  Transition of Care [facility]) CM/SW Contact:    Nicanor Bake Phone Number: [phone] 04/12/2023, 1:44 PM  Clinical Narrative:   HF CSW met with pt at bedside. Pt stated that she lives at [facility] Pt stated that she has a history of HH services. Pt stated in 2023 she used [place] services. Pt stated that she is currently using PT services at [facility]. Pt stated that she uses equipment such as Bipap machine, oxygen, cane, and rollator, and shower chair. Pt stated that she seen her PCP last December 2023. CSW explained that she a follow up hospital visit will be scheduled closer to dc.   TOC will continue following.            Expected Discharge Plan: Home w Home Health Services [phone]) Barriers to Discharge: Continued Medical Work up   Patient Goals and CMS Choice Patient states their goals for this hospitalization and ongoing recovery are:: return back home          Expected Discharge Plan and Services       Living arrangements for the past 2 months: Independent Living Facility                                      Prior Living Arrangements/Services Living arrangements for the past 2 months: Independent Living Facility Lives with:: Facility Resident Patient language and need for interpreter reviewed:: Yes Do you feel safe going back to the place where you live?: Yes      Need for Family Participation in Patient Care: Yes (Comment) Care giver support system in place?: Yes (comment)   Criminal Activity/Legal Involvement Pertinent to Current Situation/Hospitalization: No - Comment as needed  Activities of Daily Living   ADL Screening (condition at time of admission) Independently performs ADLs?: Yes (appropriate for developmental age) Is the patient deaf or have difficulty hearing?:  No Does the patient have difficulty seeing, even when wearing glasses/contacts?: No Does the patient have difficulty concentrating, remembering, or making decisions?: No  Permission Sought/Granted   Permission granted to share information with : Yes, Verbal Permission Granted  Share Information with NAME: Son, [name]           Emotional Assessment Appearance:: Appears older than stated age Attitude/Demeanor/Rapport: Engaged Affect (typically observed): Appropriate Orientation: : Oriented to Self, Oriented to Place, Oriented to  Time, Oriented to Situation Alcohol / Substance Use: Not Applicable Psych Involvement: No (comment)  Admission diagnosis:  Hypokalemia [E87.6] Patient Active Problem List   Diagnosis Date Noted   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   Pill dysphagia 04/06/2023   COPD exacerbation (HCC) 03/26/2023   Absolute anemia 03/17/2023   LLQ pain 03/17/2023   Chronic nausea 03/17/2023   Hypokalemia 02/23/2023   Diverticulitis 02/22/2023   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   Carpal tunnel syndrome, right upper limb 01/27/2023   Acute exacerbation of chronic obstructive pulmonary disease (COPD) (HCC) 01/13/2023   Dysuria 11/26/2022   Hypercoagulable state due to persistent atrial fibrillation (HCC) 11/18/2022   Acute on chronic heart failure with preserved ejection fraction (HCC) 10/26/2022   COPD with acute exacerbation (HCC) 10/09/2022   Pain in left shoulder 07/23/2022   Chronic obstructive pulmonary disease (HCC) 06/28/2022   Palliative care encounter  06/28/2022   Post-viral cough syndrome 06/02/2022   Paranasal sinus disease 05/19/2022   Heart failure with reduced ejection fraction (HCC) 05/14/2022   Pneumonia 04/29/2022   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   Depression, major, single episode, moderate (HCC) 04/27/2022   Non-STEMI (non-ST elevated myocardial infarction) (HCC) 03/18/2022   Obesity (BMI 30-39.9) 03/18/2022    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   Low TSH level 12/02/2021   Acute idiopathic gout of left hand 11/19/2021   Acquired hypothyroidism 11/12/2021   Encounter for palliative care involving management of pain 11/12/2021   Post concussive syndrome 11/11/2021   Chronic left-sided low back pain 11/04/2021   Gout 10/01/2021   Hyperlipidemia with target LDL less than 100 08/07/2021   Chronic anticoagulation 07/07/2021   History of colon polyps 07/07/2021   Stage 3a chronic kidney disease (HCC) 05/23/2021   Dietary iron deficiency 05/23/2021   Age-related osteoporosis without current pathological fracture 04/22/2021   Type 2 diabetes mellitus with diabetic neuropathy, without long-term current use of insulin (HCC) 04/22/2021   Sleep apnea 01/22/2021   Statin intolerance 10/12/2019   Statin myopathy 02/24/2019   Chronic systolic heart failure (HCC) 04/06/2018   HTN (hypertension) 04/06/2018   COPD (chronic obstructive pulmonary disease) (HCC) 04/06/2018   Persistent atrial fibrillation (HCC) 04/06/2018   Chronic respiratory failure with hypoxia (HCC) 03/28/2018   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 12/02/2015   Malnutrition of moderate degree 09/11/2015   PCP:  [name], MD Pharmacy:   [facility] [phone] [place], [place] - [address] AT [facility] [address] & [facility] [address] [place] [address] Phone: [phone] Fax: [phone]  [facility] [address] [place] [address] Phone: [phone] Fax: [phone]  [facility] - [place], [place] - [address] [facility] [place] [employer]. [address] [place] [address] Phone: [phone] Fax: [phone]  [facility] [address] [place] [address] Phone: [phone] Fax: [phone]     Social Determinants of Health (SDOH) Social  History: SDOH Screenings   Food Insecurity: No Food Insecurity (04/09/2023)  Housing: Low Risk  (04/09/2023)  Transportation Needs: No Transportation Needs (04/09/2023)  Utilities: Not At Risk (04/09/2023)  Alcohol Screen: Low Risk  (05/21/2022)  Depression (PHQ2-9): Low Risk  (05/21/2022)  Financial Resource Strain: Low Risk  (05/21/2022)  Physical Activity: Insufficiently Active (05/21/2022)  Social Connections: Moderately Isolated (05/21/2022)  Stress: No Stress Concern Present (05/21/2022)  Tobacco Use: Medium Risk (04/09/2023)   SDOH Interventions:     Readmission Risk Interventions    01/14/2023   11:16 AM 05/04/2022    3:37 PM  Readmission Risk Prevention Plan  Transportation Screening Complete Complete  PCP or Specialist Appt within 5-7 Days Complete Complete  Home Care Screening Complete Complete  Medication Review (RN CM) Complete Complete

## 2023-04-12 NOTE — Progress Notes (Signed)
Patient declined CPAP. No unit in room at this time. Patient unable to tolerate hospital mask.

## 2023-04-12 NOTE — Care Management Important Message (Signed)
Important Message  Patient Details  Name: Kathleen [name] MRN: [id number] Date of Birth: 11/14/1945   Important Message Given:  Yes - Medicare IM     Dorena Bodo 04/12/2023, 3:22 PM

## 2023-04-12 NOTE — Consult Note (Signed)
[facility]  Consult   04/12/2023  Kathleen [name] 09/09/1945 [id number]  [facility] [[facility]]  Accountable Care Organization [ACO] Patient:  Medicare ACO REACH  Primary Care Provider:  [name], MD with [place] [facility]  Patient with less than 7 days readmssion. Met with patient at the bedside.  Patient spoke of her declining health in the past few months.  She is trying to make a decision about having surgery or not about the hernia vs her lung and heart issues. She is accepting of ongoing post hospital care coordination follow up.   Patient is currently active with [facility] Care Management for care coordination services.  Patient has been engaged by a Horticulturist, commercial.  The community based plan of care has focused on disease management and community resource support.    Patient will receive a post hospital call and will be evaluated for assessments and disease process education.    Plan: Update the Care Coordination team of any additional post hospital needs.    Of note, [facility] Care Management services does not replace or interfere with any services that are needed or arranged by inpatient [facility] care management team.   Kathleen Shanks, RN, BSN, CCM Kathleen [name]  [facility], [facility] Liaison Direct Dial: [phone] or secure chat Email: [name]

## 2023-04-12 NOTE — Plan of Care (Signed)
  Problem: Activity: Goal: Risk for activity intolerance will decrease Outcome: Progressing   Problem: Nutrition: Goal: Adequate nutrition will be maintained Outcome: Progressing   Problem: Coping: Goal: Level of anxiety will decrease Outcome: Progressing   Problem: Elimination: Goal: Will not experience complications related to bowel motility Outcome: Progressing Goal: Will not experience complications related to urinary retention Outcome: Progressing   Problem: Pain Management: Goal: General experience of comfort will improve Outcome: Progressing

## 2023-04-13 ENCOUNTER — Other Ambulatory Visit (HOSPITAL_COMMUNITY): Payer: Self-pay

## 2023-04-13 DIAGNOSIS — I4819 Other persistent atrial fibrillation: Secondary | ICD-10-CM | POA: Diagnosis not present

## 2023-04-13 DIAGNOSIS — E876 Hypokalemia: Secondary | ICD-10-CM | POA: Diagnosis not present

## 2023-04-13 DIAGNOSIS — I5022 Chronic systolic (congestive) heart failure: Secondary | ICD-10-CM | POA: Diagnosis not present

## 2023-04-13 LAB — CBC
HCT: 34.1 % — ABNORMAL LOW (ref 36.0–46.0)
Hemoglobin: 10.7 g/dL — ABNORMAL LOW (ref 12.0–15.0)
MCH: 26.4 pg (ref 26.0–34.0)
MCHC: 31.4 g/dL (ref 30.0–36.0)
MCV: 84 fL (ref 80.0–100.0)
Platelets: 142 10*3/uL — ABNORMAL LOW (ref 150–400)
RBC: 4.06 MIL/uL (ref 3.87–5.11)
RDW: 15.5 % (ref 11.5–15.5)
WBC: 11.9 10*3/uL — ABNORMAL HIGH (ref 4.0–10.5)
nRBC: 0 % (ref 0.0–0.2)

## 2023-04-13 LAB — BASIC METABOLIC PANEL
Anion gap: 5 (ref 5–15)
BUN: 18 mg/dL (ref 8–23)
CO2: 28 mmol/L (ref 22–32)
Calcium: 9.6 mg/dL (ref 8.9–10.3)
Chloride: 100 mmol/L (ref 98–111)
Creatinine, Ser: 0.87 mg/dL (ref 0.44–1.00)
GFR, Estimated: >60 mL/min (ref >60)
Glucose, Bld: 211 mg/dL — ABNORMAL HIGH (ref 70–99)
Potassium: 4.3 mmol/L (ref 3.5–5.1)
Sodium: 133 mmol/L — ABNORMAL LOW (ref 135–145)

## 2023-04-13 LAB — MAGNESIUM: Magnesium: 2.1 mg/dL (ref 1.7–2.4)

## 2023-04-13 MED ORDER — TORSEMIDE 20 MG PO TABS
40.0000 mg | ORAL_TABLET | Freq: Every day | ORAL | 0 refills | Status: DC
  Filled 2023-04-13: qty 60, 30d supply, fill #0

## 2023-04-13 MED ORDER — SPIRONOLACTONE 25 MG PO TABS
25.0000 mg | ORAL_TABLET | Freq: Two times a day (BID) | ORAL | 0 refills | Status: DC
  Filled 2023-04-13: qty 60, 30d supply, fill #0

## 2023-04-13 MED ORDER — AMOXICILLIN-POT CLAVULANATE 875-125 MG PO TABS
1.0000 | ORAL_TABLET | Freq: Two times a day (BID) | ORAL | 0 refills | Status: DC
  Filled 2023-04-13: qty 20, 10d supply, fill #0

## 2023-04-13 MED ORDER — POTASSIUM CHLORIDE CRYS ER 20 MEQ PO TBCR
40.0000 meq | EXTENDED_RELEASE_TABLET | Freq: Every day | ORAL | 0 refills | Status: DC
  Filled 2023-04-13: qty 30, 15d supply, fill #0

## 2023-04-13 NOTE — Discharge Summary (Signed)
Physician Discharge Summary   Patient: Kathleen [name] MRN: [id number] DOB: [birth year]  Admit date:     04/09/2023  Discharge date: 04/13/23  Discharge Physician: Alberteen Sam   PCP: [name], MD     Recommendations at discharge:  Follow up with Dr. Meridee Score GI for diveticulitis Follow up with Cardiology for diuretic management and preoperative clearance on 11/11 Follow up with Dr. Michaell Cowing on 11/26, appointment moved up, for complicated diverticulitis [facility]: Please repeat BMP in 1 week on new torsemide dose     Discharge Diagnoses: Principal Problem:   Hypokalemia Active Problems:   Diverticulitis   Chronic respiratory failure with hypoxia (HCC)   Chronic systolic heart failure (HCC)   COPD (chronic obstructive pulmonary disease) (HCC)   Persistent atrial fibrillation (HCC)   Sleep apnea   Type 2 diabetes mellitus with diabetic neuropathy, without long-term current use of insulin (HCC)   Stage 3a chronic kidney disease (HCC)   Acquired hypothyroidism   Obesity (BMI 30-39.9)   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 Kathleen [name] is a 77 y.o. F with obesity, COPD, sCH EF 35-40% more recently recovered to 55%, BOOP, tracheobronchomalacia, chronic respiratory failure on 5L O2 at home, OSA on BiPAP at night, persAF s/p AVN ablation and PPM, as well as recurrent diverticulitis and colovaginal fistula just recently admitted for diverticulitis who presented with dizziness, weakness and worsening abdominal pain.   Admitted on K supplement and antibiotics.    8/7-8/13: A/w dyspnea diagnosed as COPD flare 9/16-9/19: A/w abdominal pain and fecal discharge from vagina, diagnosed with diverticulitis and new colovaginal fistula  10/19-10/27:  A/w SOB, abdominal pain, again diagnosed with diverticulitis and recurrent COPD flare; K 2.7 on admission  11/1-11/5: Readmitted a fourth time; this time for weakness, K 2.3; CT still shows persistent bowel thickening; given persistent abdominal pain and CT findings, resumed on antibiotics  Cardiology consulted due to hypokalemia with diuretics and Tikosyn, adjustments made      * Hypokalemia Hypomagnesemia Hypokalemia has worsened in the last month as Cardiology have been titrating up torsemide and metolazone  Cardiology wanted, and then doubled spironolactone.  Started back daily potassium dose, stopped metolazone,   Potassium improved to 4.3, cardiology recommended repeat BMP in 1 week    Diverticulitis Colovaginal fistula After last discharge, patient's pain had resolved, then came back after finishing antibiotics.    Here she had left lower quadrant pain, leukocytosis, and CT imaging is consistent with persistent diverticulitis.  Discussed with GI, Dr. Lavon Paganini.  See discussion from yesterday, but we elected to treat with another course of oral antibiotics, follow-up with GI in the office.    Overall, patient stable but with high burden of symptoms from colovaginal fistula, and suffering a lot.  Has had preop Pulm clearance.  Will get preop Cardiology clearance on 11/11.           The [facility] Controlled Substances Registry was reviewed for this patient prior to discharge.  Consultants: Cardiology  Procedures performed: None  Disposition: Home Diet recommendation:  Discharge Diet Orders (From admission, onward)     Start     Ordered   04/13/23 0000  Diet - low sodium heart healthy        04/13/23 1157  DISCHARGE MEDICATION: Allergies as of 04/13/2023       Reactions   Allopurinol Other (See Comments)   Reaction:  Dizziness    Clindamycin Anaphylaxis, Hives   Flublok [influenza Vaccine Recombinant] Other (See Comments)   Fever  103 with no alternative explanation day after vaccine.  Clydie Braun Highfill FNP-C   Pneumococcal 13-val Conj Vacc Itching, Swelling, Rash   Dronedarone Rash   Montelukast Other (See Comments)   Makes her loopy.   Brovana [arformoterol]    Caused muscle pain   Budesonide    Caused extreme joint pain   Entresto [sacubitril-valsartan] Other (See Comments)   hypotension   Fosamax [alendronate Sodium] Nausea Only   Jardiance [empagliflozin]    Caused a vaginal infection   Meperidine Nausea And Vomiting   Microplegia Msa-msg [plegisol]    Loopy,diarrhea   Rosuvastatin Other (See Comments)   Reaction:  Muscle spasms    Tetracycline Hives   Adhesive [tape] Rash   Lovastatin Rash, Other (See Comments)   Muscle Pain        Medication List     STOP taking these medications    doxycycline 100 MG tablet Commonly known as: VIBRA-TABS   guaiFENesin 600 MG 12 hr tablet Commonly known as: Mucinex   metolazone 2.5 MG tablet Commonly known as: ZAROXOLYN   metroNIDAZOLE 500 MG tablet Commonly known as: FLAGYL       TAKE these medications    acetaminophen 650 MG CR tablet Commonly known as: TYLENOL Take 1,300 mg by mouth every 8 (eight) hours as needed for pain.   amoxicillin-clavulanate 875-125 MG tablet Commonly known as: AUGMENTIN Take 1 tablet by mouth every 12 (twelve) hours.   azelastine 0.1 % nasal spray Commonly known as: ASTELIN Place 2 sprays into both nostrils 2 (two) times daily.   Breo Ellipta 100-25 MCG/ACT Aepb Generic drug: fluticasone furoate-vilanterol INHALE 1 PUFF INTO THE LUNGS DAILY   cyanocobalamin 1000 MCG/ML injection Commonly known as: VITAMIN B12 Inject 1 mL (1,000 mcg total) into the muscle every 30 (thirty) days.   docusate sodium 100 MG capsule Commonly known as: COLACE Take 100 mg by mouth 2 (two) times daily.   dofetilide 250 MCG capsule Commonly known as: TIKOSYN Take 1 capsule (250 mcg total) by mouth in the morning and at bedtime.    febuxostat 40 MG tablet Commonly known as: ULORIC TAKE 1 TABLET(40 MG) BY MOUTH DAILY   gabapentin 100 MG capsule Commonly known as: Neurontin Take 2 capsules (200 mg total) by mouth at bedtime.   levalbuterol 0.63 MG/3ML nebulizer solution Commonly known as: XOPENEX Take 3 mLs (0.63 mg total) by nebulization every 6 (six) hours as needed for wheezing or shortness of breath. DX J44.89 J96.21   Magnesium 500 MG Tabs Take 500 mg by mouth at bedtime.   nystatin 100000 UNIT/ML suspension Commonly known as: MYCOSTATIN Take 5 mLs (500,000 Units total) by mouth 4 (four) times daily.   OXYGEN Inhale 5 L into the lungs continuous. Use with resmed ventilator   pantoprazole 40 MG tablet Commonly known as: PROTONIX Take 1 tablet (40 mg total) by mouth daily.   potassium chloride SA 20 MEQ tablet Commonly known as: KLOR-CON M Take 2 tablets (40 mEq total) by mouth daily. Start taking on: April 14, 2023 What changed:  how much to take how to take this when to take this additional instructions   predniSONE 10 MG tablet Commonly known as: DELTASONE Take 4 tablets (40 mg total) by mouth daily for  2 days, THEN 3 tablets (30 mg total) daily for 2 days, THEN 3 tablets (30 mg total) daily for 2 days, THEN 2 tablets (20 mg total) daily for 2 days, THEN 1 tablet (10 mg total) daily for 2 days, THEN 0.5 tablets (5 mg total) daily for 2 days. Start taking on: April 04, 2023   revefenacin 175 MCG/3ML nebulizer solution Commonly known as: YUPELRI Take 175 mcg by nebulization daily.   rivaroxaban 20 MG Tabs tablet Commonly known as: Xarelto Take 1 tablet (20 mg total) by mouth daily with supper.   spironolactone 25 MG tablet Commonly known as: ALDACTONE Take 1 tablet (25 mg total) by mouth 2 (two) times daily.   torsemide 20 MG tablet Commonly known as: DEMADEX Take 2 tablets (40 mg total) by mouth daily. Start taking on: April 14, 2023 What changed: how much to take   Trulance  3 MG Tabs Generic drug: Plecanatide Take 1 tablet (3 mg total) by mouth daily.   Vitamin D3 50 MCG (2000 UT) Tabs Take 2,000 Units by mouth at bedtime.        Follow-up Information     Karie Soda, MD. Go on 05/04/2023.   Specialties: General Surgery, Colon and Rectal Surgery Why: Your appointment is 11/26 at 8:45am Arrive early to check in, fill out paperwork, Bring photo ID and insurance information Contact information: [address] [place] [address] [phone]         [name], MD Follow up.   Specialty: Internal Medicine Contact information: [address] [place] [address] [phone]         Mansouraty, Netty Starring., MD Follow up.   Specialties: Gastroenterology, Internal Medicine Contact information: [address] [place] [address] [phone]                 Discharge Instructions     Diet - low sodium heart healthy   Complete by: As directed    Discharge instructions   Complete by: As directed    **IMPORTANT DISCHARGE INSTRUCTIONS**   From Dr. Maryfrances Bunnell: You were admitted for low potassium (hypokalemia) and diverticulitis.  For the low potassium, this was caused by the diuretics (both torsemide and metolazone, the super pill, cause potassium loss), and also maybe worsened by loss of potassium in bowel movements.  Cardiology recommend the following: Torsemide 40 mg once daily Potassium 40 mEq once daily No metolazone/super pill for now  Get your labs checked in 1 week See Cardiology on the 11th   For the diverticulitis, this seems mild but still present Take the antibiotic Augmentin 875-125 mg twice daily for 10 more days Go see Dr. Meridee Score as planned  If your pain comes back before you see Dr. Meridee Score, please call him  See the Surgeon on 11/26   Increase activity slowly   Complete by: As directed        Discharge Exam: Filed Weights   04/09/23 1121 04/12/23 1017   Weight: 80 kg 80.9 kg    General: Pt is alert, awake, not in acute distress Cardiovascular: RRR, nl S1-S2, no murmurs appreciated.   No LE edema.   Respiratory: Normal respiratory rate and rhythm.  CTAB without rales or wheezes. Abdominal: Abdomen soft but with tenderness in LLQ.  No distension or HSM.   Neuro/Psych: Strength symmetric in upper and lower extremities.  Judgment and insight appear normal .   Condition at discharge: fair  The results of significant diagnostics  from this hospitalization (including imaging, microbiology, ancillary and laboratory) are listed below for reference.   Imaging Studies: CT ABDOMEN PELVIS W CONTRAST  Result Date: 04/09/2023 CLINICAL DATA:  Left lower quadrant abdominal pain. EXAM: CT ABDOMEN AND PELVIS WITH CONTRAST TECHNIQUE: Multidetector CT imaging of the abdomen and pelvis was performed using the standard protocol following bolus administration of intravenous contrast. RADIATION DOSE REDUCTION: This exam was performed according to the departmental dose-optimization program which includes automated exposure control, adjustment of the mA and/or kV according to patient size and/or use of iterative reconstruction technique. CONTRAST:  75mL OMNIPAQUE IOHEXOL 350 MG/ML SOLN COMPARISON:  Abdominopelvic CT 04/01/2023 and 03/24/2023. FINDINGS: Lower chest: Chronic linear atelectasis or scarring at both lung bases. The heart is enlarged. Pacemaker leads appear unchanged. No significant pleural or pericardial effusion. Hepatobiliary: The liver is normal in density without suspicious focal abnormality. Unchanged hepatic cysts. No biliary dilatation status post cholecystectomy. Pancreas: Unremarkable. No pancreatic ductal dilatation or surrounding inflammatory changes. Spleen: Normal in size without focal abnormality. Adrenals/Urinary Tract: Both adrenal glands appear normal. No evidence of urinary tract calculus, suspicious renal lesion or hydronephrosis. Unchanged  renal cysts bilaterally for which no specific follow-up imaging is recommended. Mild renal cortical thinning bilaterally. The bladder appears unremarkable for its degree of distention. There is no bladder wall thickening or air in the bladder lumen. Stomach/Bowel: No enteric contrast administered. The stomach appears unremarkable for its degree of distention. No small bowel distension, wall thickening or surrounding inflammation. Prominent liquid stool throughout the colon. As seen on previous studies, there are sigmoid colon diverticular changes with wall thickening and surrounding inflammatory changes suspicious for ongoing diverticulitis. 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 No focal periarticular fluid collections are identified. No evidence of bowel obstruction. Vascular/Lymphatic: There are no enlarged abdominal or pelvic lymph nodes. Aortic and branch vessel atherosclerosis without evidence of aneurysm or large vessel occlusion. Reproductive: Hysterectomy. The adnexal regions appear stable. As above, persistent but less convincing evidence of a colovaginal fistula. There is air in the vagina. Other: Diffuse thinning and laxity of the anterior abdominal wall musculature, greater on the left. No focal hernia. No ascites or pneumoperitoneum. Musculoskeletal: No acute or significant osseous findings. Mild spondylosis. IMPRESSION: 1. 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 No focal periarticular fluid collections or pneumoperitoneum. 2. Prominent liquid stool throughout the colon suggesting diarrheal illness. No evidence of bowel obstruction. 3. No new findings are seen. 4.  Aortic Atherosclerosis (ICD10-I70.0). Electronically Signed   By: Carey Bullocks M.D.   On: 04/09/2023 17:14   DG Chest Port 1 View  Result Date: 04/09/2023 CLINICAL DATA:  Presyncope  EXAM: PORTABLE CHEST 1 VIEW COMPARISON:  03/31/2023 and older. FINDINGS: Left upper chest battery pack for pacemaker leads along the right side of the heart. Film is rotated to left. Stable cardiopericardial silhouette. Small left effusion. Mild lung base opacity in the left. No pneumothorax chronic lung changes. Overlapping cardiac leads. Degenerative changes of the shoulders with possible right-sided full-thickness rotator cuff tear. IMPRESSION: Pacemaker.  Rotated radiograph.  Small left effusion and opacity. Chronic lung changes Electronically Signed   By: Karen Kays M.D.   On: 04/09/2023 14:57   CT ABDOMEN PELVIS W CONTRAST  Result Date: 04/01/2023 CLINICAL DATA:  Abdominal pain with bloody diarrhea. Of colovaginal fistula. EXAM: CT ABDOMEN AND PELVIS WITH CONTRAST TECHNIQUE: Multidetector CT imaging of the abdomen and pelvis was  performed using the standard protocol following bolus administration of intravenous contrast. RADIATION DOSE REDUCTION: This exam was performed according to the departmental dose-optimization program which includes automated exposure control, adjustment of the mA and/or kV according to patient size and/or use of iterative reconstruction technique. CONTRAST:  OMNIPAQUE IOHEXOL 300 MG/ML  SOLN COMPARISON:  03/24/2023 FINDINGS: Lower chest: Heart is enlarged. Atelectasis noted at the lung bases. Hepatobiliary: Scattered tiny hypodensities in the liver parenchyma are too small to characterize but are statistically most likely benign. No followup imaging is recommended. Gallbladder is surgically absent. No intrahepatic or extrahepatic biliary dilation. Pancreas: No focal mass lesion. No dilatation of the main duct. No intraparenchymal cyst. No peripancreatic edema. Spleen: No splenomegaly. No suspicious focal mass lesion. Adrenals/Urinary Tract: No adrenal nodule or mass. Right kidney unremarkable. Stable exophytic cyst lower interpolar left kidney. No followup imaging is  recommended. No evidence for hydroureter. The urinary bladder appears normal for the degree of distention. Stomach/Bowel: Stomach is unremarkable. No gastric wall thickening. No evidence of outlet obstruction. Duodenum is normally positioned as is the ligament of Treitz. No small bowel wall thickening. No small bowel dilatation. Wall thickening and diverticulosis again noted in the sigmoid colon with pericolonic edema/inflammation consistent with diverticulitis. Interval decrease in fluid seen anterior to the distal sigmoid colon and in the cul-de-sac. There is loss of fat plane between the vaginal cuff and the distal sigmoid colon. Vaginal vault is filled with gas and high density material, features compatible with reported clinical history of colovaginal fistula. History Vascular/Lymphatic: There is mild atherosclerotic calcification of the abdominal aorta without aneurysm. There is no gastrohepatic or hepatoduodenal ligament lymphadenopathy. No retroperitoneal or mesenteric lymphadenopathy. No pelvic sidewall lymphadenopathy. Reproductive: Hysterectomy. Gas and high density material seen in the vagina as described above. There is no adnexal mass. Other: Interval decrease in free fluid seen previously. Musculoskeletal: No worrisome lytic or sclerotic osseous abnormality. IMPRESSION: 1. Interval decrease in free fluid seen previously anterior to the distal sigmoid colon and in the cul-de-sac. 2. Loss of fat plane between the vaginal cuff and the distal sigmoid colon. As before, vaginal vault is filled with gas and high density material, features compatible with reported clinical history of colovaginal fistula. 3.  Aortic Atherosclerosis (ICD10-I70.0). Electronically Signed   By: Kennith Center M.D.   On: 04/01/2023 15:00   DG CHEST PORT 1 VIEW  Result Date: 03/31/2023 CLINICAL DATA:  Shortness of breath. EXAM: PORTABLE CHEST 1 VIEW COMPARISON:  Chest radiograph and CT 03/30/2023 FINDINGS: A pacemaker remains in  place. The cardiac silhouette remains mildly enlarged. Pulmonary vascular congestion is similar to the prior radiograph, as are mild bibasilar opacities. No large pleural effusion or pneumothorax is identified. IMPRESSION: Unchanged pulmonary vascular congestion and bibasilar atelectasis. Electronically Signed   By: Sebastian Ache M.D.   On: 03/31/2023 09:24   CT CHEST WO CONTRAST  Result Date: 03/30/2023 CLINICAL DATA:  Chronic cough EXAM: CT CHEST WITHOUT CONTRAST TECHNIQUE: Multidetector CT imaging of the chest was performed following the standard protocol without IV contrast. RADIATION DOSE REDUCTION: This exam was performed according to the departmental dose-optimization program which includes automated exposure control, adjustment of the mA and/or kV according to patient size and/or use of iterative reconstruction technique. COMPARISON:  Chest CT dated March 18, 2022 FINDINGS: Cardiovascular: Cardiomegaly. Normal caliber thoracic aorta with moderate calcified plaque. Severe coronary artery calcifications. Dilated main pulmonary artery, measuring up to 3.6 cm. CardioMEMS device in the right lower lobe pulmonary artery. Mediastinum/Nodes: Esophagus and  thyroid are unremarkable. No enlarged lymph nodes seen in the chest. Lungs/Pleura: Central airways are patent. Severe centrilobular emphysema. Mild lower lung predominant linear opacities which are likely due to scarring or atelectasis. Stable subpleural solid nodule of the right lower lobe measuring 5 mm on series 6, image 104, no specific follow-up imaging is necessary given greater than 1 year stability. Upper Abdomen: Prior cholecystectomy. Musculoskeletal: No chest wall mass or suspicious bone lesions identified. IMPRESSION: 1. No acute airspace opacity. 2. Dilated main pulmonary artery, which can be seen in the setting of pulmonary hypertension. 3. Coronary artery calcifications, aortic Atherosclerosis (ICD10-I70.0) and Emphysema (ICD10-J43.9). 4.  Cardiomegaly. Electronically Signed   By: Allegra Lai M.D.   On: 03/30/2023 11:27   DG CHEST PORT 1 VIEW  Result Date: 03/30/2023 CLINICAL DATA:  Shortness of breath. EXAM: PORTABLE CHEST 1 VIEW COMPARISON:  March 29, 2023. FINDINGS: Stable cardiomediastinal silhouette. Left-sided pacemaker is unchanged. Mild central pulmonary vascular congestion is noted. Mildly increased interstitial densities are noted throughout both lungs which may represent edema. Small left pleural effusion is noted. Bibasilar atelectasis is again noted. Bony thorax is unremarkable. IMPRESSION: Stable cardiomediastinal silhouette with mild central pulmonary vascular congestion and possible bilateral pulmonary edema. Small left pleural effusion. Bibasilar subsegmental atelectasis. Electronically Signed   By: Lupita Raider M.D.   On: 03/30/2023 10:30   DG CHEST PORT 1 VIEW  Result Date: 03/29/2023 CLINICAL DATA:  Shortness of breath. EXAM: PORTABLE CHEST 1 VIEW COMPARISON:  03/28/2023 FINDINGS: The cardio pericardial silhouette is enlarged. Bibasilar atelectasis with possible tiny bilateral pleural effusions. No pulmonary edema. Left-sided permanent pacemaker again noted. Degenerative changes noted right shoulder. Telemetry leads overlie the chest. IMPRESSION: Bibasilar atelectasis with possible tiny bilateral pleural effusions. Electronically Signed   By: Kennith Center M.D.   On: 03/29/2023 19:10   DG CHEST PORT 1 VIEW  Result Date: 03/28/2023 CLINICAL DATA:  Shortness of breath. EXAM: PORTABLE CHEST 1 VIEW COMPARISON:  One-view chest x-ray 03/27/2023 FINDINGS: The heart is enlarged. Atherosclerotic calcifications are present at the aortic arch. Mild pulmonary vascular congestion is present. No focal airspace disease is present. Pacing wires are stable. Advanced degenerative changes are present in the shoulders, right greater than left. IMPRESSION: 1. Cardiomegaly and mild pulmonary vascular congestion without frank  edema. 2. Aortic atherosclerosis. Electronically Signed   By: Marin Roberts M.D.   On: 03/28/2023 10:26   CT ABDOMEN PELVIS W CONTRAST  Result Date: 03/27/2023 CLINICAL DATA:  History of diverticulitis and colovaginal fistula, left lower quadrant pain EXAM: CT ABDOMEN AND PELVIS WITH CONTRAST TECHNIQUE: Multidetector CT imaging of the abdomen and pelvis was performed using the standard protocol following bolus administration of intravenous contrast. RADIATION DOSE REDUCTION: This exam was performed according to the departmental dose-optimization program which includes automated exposure control, adjustment of the mA and/or kV according to patient size and/or use of iterative reconstruction technique. CONTRAST:  OMNIPAQUE IOHEXOL 300 MG/ML  SOLN COMPARISON:  02/22/2023 FINDINGS: Lower chest: Lung bases are free of acute infiltrate or sizable effusion. Stable 5 mm right lower lobe nodule is seen. Hepatobiliary: Few scattered cysts are again noted within the liver. The gallbladder has been surgically removed. Pancreas: Unremarkable. No pancreatic ductal dilatation or surrounding inflammatory changes. Spleen: Normal in size without focal abnormality. Adrenals/Urinary Tract: Adrenal glands are within normal limits. Left renal cyst is again noted and stable. No follow-up is recommended. Normal excretion is noted bilaterally. No calculi or obstructive changes are seen. Ureters are within normal limits.  The bladder is decompressed. Stomach/Bowel: Persistent inflammatory changes are noted in the sigmoid colon consistent with diverticulitis. No discrete abscess is identified at this time. No evidence of perforation is seen. There is however a persistent hyperdense tract extending from the sigmoid to the vaginal cuff consistent with a colovaginal fistula. Air is noted within the vagina. More proximal colon shows no acute abnormality. The appendix is not well visualized. Small bowel and stomach are  unremarkable. Vascular/Lymphatic: Aortic atherosclerosis. No enlarged abdominal or pelvic lymph nodes. Reproductive: Uterus has been surgically removed. There is air again noted within the vaginal vault with findings consistent with a colovaginal fistula stable from the prior exam. No adnexal mass is seen. Other: No abdominal wall hernia or abnormality. No abdominopelvic ascites. Musculoskeletal: Degenerative changes of the lumbar spine are noted. IMPRESSION: Persistent changes consistent with a colovaginal fistula. Persistent acute sigmoid diverticulitis is identified as well. No discrete abscess is noted. Stable 5 mm nodule in the right lower lobe. No follow-up needed if patient is low-risk. Non-contrast chest CT can be considered in 12 months if patient is high-risk. This recommendation follows the consensus statement: Guidelines for Management of Incidental Pulmonary Nodules Detected on CT Images: From the [facility] Society 2017; Radiology 2017; 284:228-243. Electronically Signed   By: Alcide Clever M.D.   On: 03/27/2023 17:16   DG CHEST PORT 1 VIEW  Result Date: 03/27/2023 CLINICAL DATA:  Shortness of breath EXAM: PORTABLE CHEST 1 VIEW COMPARISON:  Film from the previous day. FINDINGS: Cardiac shadow is stable. Pacing device is again seen and stable. The lungs are hyperinflated but clear. No focal infiltrate or effusion is noted. No bony abnormality is seen. IMPRESSION: Hyperinflation without focal infiltrate. Electronically Signed   By: Alcide Clever M.D.   On: 03/27/2023 17:09   DG Chest Portable 1 View  Result Date: 03/26/2023 CLINICAL DATA:  Cough for 4 days EXAM: PORTABLE CHEST 1 VIEW COMPARISON:  02/22/2023 FINDINGS: Left-sided pacing device. Cardiomegaly with probable small pleural effusions and hazy basilar atelectasis or infiltrates. Aortic atherosclerosis IMPRESSION: Cardiomegaly with probable small pleural effusions and hazy basilar atelectasis or infiltrates. Electronically Signed   By: Jasmine Pang M.D.   On: 03/26/2023 20:58    Microbiology: Results for orders placed or performed during the hospital encounter of 04/09/23  Blood culture (routine x 2)     Status: None (Preliminary result)   Collection Time: 04/09/23  3:27 PM   Specimen: BLOOD  Result Value Ref Range Status   Specimen Description BLOOD SITE NOT SPECIFIED  Final   Special Requests   Final    BOTTLES DRAWN AEROBIC AND ANAEROBIC Blood Culture results may not be optimal due to an inadequate volume of blood received in culture bottles   Culture   Final    NO GROWTH 4 DAYS Performed at [facility], [address], [place], [place] [address]    Report Status PENDING  Incomplete    Labs: CBC: Recent Labs  Lab 04/09/23 1212 04/09/23 1413 04/10/23 0420 04/11/23 0431 04/13/23 0436  WBC 20.9*  --  14.3* 10.3 11.9*  NEUTROABS 18.2*  --   --   --   --   HGB 13.8 14.6 12.3 11.6* 10.7*  HCT 43.9 43.0 39.6 37.1 34.1*  MCV 83.1  --  85.3 83.9 84.0  PLT 283  --  200 159 142*   Basic Metabolic Panel: Recent Labs  Lab 04/09/23 1402 04/09/23 1413 04/09/23 2041 04/10/23 0420 04/11/23 0431 04/12/23 0428 04/12/23 1406  04/13/23 0436  NA  --    < >  --  134* 135 136 136 133*  K  --    < > 3.6 3.8 3.3* 3.0* 3.7 4.3  CL  --    < >  --  98 101 93* 93* 100  CO2  --   --   --  24 24 29 28 28   GLUCOSE  --    < >  --  141* 140* 185* 233* 211*  BUN  --    < >  --  23 11 20 21 18   CREATININE  --    < >  --  0.93 0.67 0.88 0.81 0.87  CALCIUM  --   --   --  8.9 9.9 9.5 9.8 9.6  MG 2.3  --  2.3 2.4  --  1.7  --  2.1   < > = values in this interval not displayed.   Liver Function Tests: Recent Labs  Lab 04/09/23 1212  AST 20  ALT 14  ALKPHOS 37*  BILITOT 1.2  PROT 6.6  ALBUMIN 4.0   CBG: No results for input(s): "GLUCAP" in the last 168 hours.  Discharge time spent: approximately 35 minutes spent on discharge counseling, evaluation of patient on day of discharge, and coordination of discharge  planning with nursing, social work, pharmacy and case management  Signed: Alberteen Sam, MD [facility] 04/13/2023

## 2023-04-13 NOTE — TOC Transition Note (Signed)
Transition of Care [facility]) - CM/SW Discharge Note   Patient Details  Name: Kathleen [name] MRN: [id number] Date of Birth: 1946-05-17  Transition [facility] Care [facility]) CM/SW Contact:  Nicanor Bake Phone Number: [phone] 04/13/2023, 3:08 PM   Clinical Narrative:  CSW spoke with pts nurse who confirmed that the pts son, [name] was transporting his mother back home.        Barriers to Discharge: Continued Medical Work up   Patient Goals and CMS Choice      Discharge Placement                         Discharge Plan and Services Additional resources added to the After Visit Summary for                                       Social Determinants of Health (SDOH) Interventions SDOH Screenings   Food Insecurity: No Food Insecurity (04/09/2023)  Housing: Low Risk  (04/09/2023)  Transportation Needs: No Transportation Needs (04/09/2023)  Utilities: Not At Risk (04/09/2023)  Alcohol Screen: Low Risk  (05/21/2022)  Depression (PHQ2-9): Low Risk  (05/21/2022)  Financial Resource Strain: Low Risk  (05/21/2022)  Physical Activity: Insufficiently Active (05/21/2022)  Social Connections: Moderately Isolated (05/21/2022)  Stress: No Stress Concern Present (05/21/2022)  Tobacco Use: Medium Risk (04/09/2023)     Readmission Risk Interventions    01/14/2023   11:16 AM 05/04/2022    3:37 PM  Readmission Risk Prevention Plan  Transportation Screening Complete Complete  PCP or Specialist Appt within 5-7 Days Complete Complete  Home Care Screening Complete Complete  Medication Review (RN CM) Complete Complete

## 2023-04-13 NOTE — Progress Notes (Signed)
No ICM remote transmission received for 04/13/2023 due to currently hospitalized and next ICM transmission scheduled for 04/26/2023.

## 2023-04-13 NOTE — Progress Notes (Addendum)
Advanced Heart Failure Rounding Note  PCP-Cardiologist: None   Subjective:    K 4.3. Repleted yesterday   Feels good this morning ready to go home. Ate most of her breakfast. Just feels a little tired today.   Objective:   Weight Range: 80.9 kg Body mass index is 34.84 kg/m.   Vital Signs:   Temp:  [97.5 F (36.4 C)-98.1 F (36.7 C)] 97.5 F (36.4 C) (11/05 0911) Pulse Rate:  [70-99] 75 (11/05 0911) Resp:  [15-20] 16 (11/05 0911) BP: (112-148)/(59-92) 112/64 (11/05 0911) SpO2:  [98 %-100 %] 100 % (11/05 0911) Weight:  [80.9 kg] 80.9 kg (11/04 1017) Last BM Date : 04/12/23  Weight change: Filed Weights   04/09/23 1121 04/12/23 1017  Weight: 80 kg 80.9 kg    Intake/Output:   Intake/Output Summary (Last 24 hours) at 04/13/2023 0937 Last data filed at 04/13/2023 0535 Gross per 24 hour  Intake 801.27 ml  Output 0 ml  Net 801.27 ml    Physical Exam    General:  elderly appearing.  No respiratory difficulty HEENT: normal Neck: supple. JVD flat. Carotids 2+ bilat; no bruits. No lymphadenopathy or thyromegaly appreciated. Cor: PMI nondisplaced. Regular rate & rhythm. No rubs, gallops or murmurs. Lungs: diminished Abdomen: soft, nontender, nondistended. No hepatosplenomegaly. No bruits or masses. Good bowel sounds. Extremities: no cyanosis, clubbing, rash, edema  Neuro: alert & oriented x 3, cranial nerves grossly intact. moves all 4 extremities w/o difficulty. Affect pleasant.   Telemetry   AV paced low 100s (Personally reviewed)    EKG    AV paced 70 QTc 464   Labs    CBC Recent Labs    04/11/23 0431 04/13/23 0436  WBC 10.3 11.9*  HGB 11.6* 10.7*  HCT 37.1 34.1*  MCV 83.9 84.0  PLT 159 142*   Basic Metabolic Panel Recent Labs    16/10/96 0428 04/12/23 1406 04/13/23 0436  NA 136 136 133*  K 3.0* 3.7 4.3  CL 93* 93* 100  CO2 29 28 28   GLUCOSE 185* 233* 211*  BUN 20 21 18   CREATININE 0.88 0.81 0.87  CALCIUM 9.5 9.8 9.6  MG 1.7  --  2.1    Liver Function Tests No results for input(s): "AST", "ALT", "ALKPHOS", "BILITOT", "PROT", "ALBUMIN" in the last 72 hours. No results for input(s): "LIPASE", "AMYLASE" in the last 72 hours. Cardiac Enzymes No results for input(s): "CKTOTAL", "CKMB", "CKMBINDEX", "TROPONINI" in the last 72 hours.  BNP: BNP (last 3 results) Recent Labs    03/26/23 2016 04/02/23 0703 04/09/23 1212  BNP 48.5 71.5 48.4    ProBNP (last 3 results) No results for input(s): "PROBNP" in the last 8760 hours.   D-Dimer No results for input(s): "DDIMER" in the last 72 hours. Hemoglobin A1C No results for input(s): "HGBA1C" in the last 72 hours. Fasting Lipid Panel No results for input(s): "CHOL", "HDL", "LDLCALC", "TRIG", "CHOLHDL", "LDLDIRECT" in the last 72 hours. Thyroid Function Tests No results for input(s): "TSH", "T4TOTAL", "T3FREE", "THYROIDAB" in the last 72 hours.  Invalid input(s): "FREET3"  Other results:   Imaging    No results found.   Medications:     Scheduled Medications:  amoxicillin-clavulanate  1 tablet Oral Q12H   docusate sodium  100 mg Oral BID   dofetilide  250 mcg Oral BID   febuxostat  40 mg Oral Daily   gabapentin  200 mg Oral QHS   guaiFENesin  600 mg Oral BID   magnesium oxide  400  mg Oral QHS   nystatin  5 mL Oral QID   pantoprazole  40 mg Oral Daily   potassium chloride  40 mEq Oral Daily   [START ON 04/14/2023] predniSONE  5 mg Oral Daily   revefenacin  175 mcg Nebulization Daily   rivaroxaban  20 mg Oral Q supper   spironolactone  25 mg Oral BID   torsemide  40 mg Oral Daily    Infusions:  sodium chloride Stopped (04/09/23 1442)    PRN Medications: acetaminophen **OR** acetaminophen, levalbuterol, melatonin, mineral oil-hydrophilic petrolatum, ondansetron **OR** ondansetron (ZOFRAN) IV  Patient Profile   Ms Ramakrishnan is a 77 year old with a history of morbid obesity, OSA on CPAP, BOOP tracheobronchmalacia, Chronic respiratory failure, colovesical  fistula, 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   Admitted with hypokalemia and abd pain.    Assessment/Plan  1. Hypokalemia: In setting of aggressive diuresis + output from colovesical fistula.  Most recent K was 3.  - K and Mg replaced.  - Continue spironolactone 25 mg bid.   - Start torsemide 40 mg daily with KDUR 40 mEq daily.  Would hold off on metolazone at home for now.  2. Chronic systolic CHF: Nonischemic cardiomyopathy.  Last echo in 4/24 with EF 35-40%, RV normal.  Has AV nodal ablation with left bundle lead. She does not appear volume overloaded.  - Spironolactone and torsemide as above.  - Did not tolerate Entresto in past due to low BP, can start losartan OP.  3. BOOP with tracheobronchomalacia: On 3-4 L oxygen at home, 4L [place] today.  4. Atrial fibrillation: Persistent.  Has had AV nodal ablation.  She is atrial pacing.  She is on Tikosyn. QTc on ECG today not significantly prolonged.  - Continue Tikosyn, need to keep K within normal range.  - Continue Xarelto.  5. Diverticulitis with colovesical fistula: Has significant stool output via fistula, loses K this way.  - Ultimately, needs surgical repair.   Appears stable for discharge today. Has f/u scheduled in AHF clinic on Monday. Plan for labs then to recheck K.   AHF meds at discharge:  Tikosyn 250 mcg BID KDUR 40 mEq daily Xarelto 20 mg QHS  Spiro 25 mg BID Torsemide 40 mg daily  Length of Stay: 4  Alen Bleacher, NP  04/13/2023, 9:37 AM  Advanced Heart Failure Team Pager [phone] (M-F; 7a - 5p)  Please contact [facility] for night-coverage after hours (5p -7a ) and weekends on [email or web address]   Patient seen with NP, agree with the above note.   K is now normal.  She denies dyspnea.  Wants to go home.  She is on her baseline 4 L oxygen by [place].   General: NAD Neck: Thick. No JVD, no thyromegaly or thyroid nodule.  Lungs: Clear to auscultation  bilaterally with normal respiratory effort. CV: Nondisplaced PMI.  Heart regular S1/S2, no S3/S4, no murmur.  No peripheral edema.   Abdomen: Soft, nontender, no hepatosplenomegaly, no distention.  Skin: Intact without lesions or rashes.  Neurologic: Alert and oriented x 3.  Psych: Normal affect. Extremities: No clubbing or cyanosis.  HEENT: Normal.   I think she can go home today.  K is normal and she looks euvolemic.    She will continue Tikosyn, QTc ok on last ECG.  She is in NSR.   She will take torsemide 40 mg daily, KCl 40 daily, spironolactone 25 bid, and no metolazone  for now.  Followup in HF clinic 10-14 days, BMET 1 week.   Marca Ancona 04/13/2023 11:23 AM

## 2023-04-14 ENCOUNTER — Telehealth (HOSPITAL_COMMUNITY): Payer: Self-pay | Admitting: Licensed Clinical Social Worker

## 2023-04-14 ENCOUNTER — Telehealth: Payer: Self-pay

## 2023-04-14 LAB — CULTURE, BLOOD (ROUTINE X 2): Culture: NO GROWTH

## 2023-04-14 NOTE — Transitions of Care (Post Inpatient/ED Visit) (Signed)
04/14/2023  Name: Kathleen [name] MRN: [id number] DOB: 01/01/1946  Today's TOC FU Call Status: Today's TOC FU Call Status:: Successful TOC FU Call Completed Patient's Name and Date of Birth confirmed.  Transition Care Management Follow-up Telephone Call Date of Discharge: 04/13/23 Discharge Facility: [facility]) Type of Discharge: Inpatient Admission Primary Inpatient Discharge Diagnosis:: Diverticulitis Complicated by Vagina/Colon fistula How have you been since you were released from the hospital?: Better Any questions or concerns?: No  Items Reviewed: Did you receive and understand the discharge instructions provided?: Yes Medications obtained,verified, and reconciled?: Yes (Medications Reviewed) Any new allergies since your discharge?: No Dietary orders reviewed?: Yes Type of Diet Ordered:: low salt, heart healthy Do you have support at home?: Yes People in Home: child(ren), adult Name of Support/Comfort Primary Source: [name]  Medications Reviewed Today: Medications Reviewed Today     Reviewed by Jodelle Gross, RN (Case Manager) on 04/14/23 at 1242  Med List Status: <None>   Medication Order Taking? Sig Documenting Provider Last Dose Status Informant  acetaminophen (TYLENOL) 650 MG CR tablet [id number] Yes Take 1,300 mg by mouth every 8 (eight) hours as needed for pain. [provider] Taking Active Self, Pharmacy Records  amoxicillin-clavulanate (AUGMENTIN) 875-125 MG tablet [id number] Yes Take 1 tablet by mouth every 12 (twelve) hours. Alberteen Sam, MD Taking Active   azelastine (ASTELIN) 0.1 % nasal spray [id number] Yes Place 2 sprays into both nostrils 2 (two) times daily. Oretha Milch, MD Taking Active Self, Pharmacy Records  Cholecalciferol (VITAMIN D3) 50 MCG (2000 UT) TABS [id number] Yes Take 2,000 Units by mouth at bedtime. [provider] Taking Active Self, Pharmacy Records  cyanocobalamin (VITAMIN B12) 1000 MCG/ML injection  [id number] Yes Inject 1 mL (1,000 mcg total) into the muscle every 30 (thirty) days. [name], MD Taking Active Self, Pharmacy Records  docusate sodium (COLACE) 100 MG capsule [id number] Yes Take 100 mg by mouth 2 (two) times daily. [provider] Taking Active Self, Pharmacy Records  dofetilide [facility]) 250 MCG capsule [id number] Yes Take 1 capsule (250 mcg total) by mouth in the morning and at bedtime. Fenton, Laural Benes, PA Taking Active Self, Pharmacy Records  febuxostat (ULORIC) 40 MG tablet [id number] Yes TAKE 1 TABLET(40 MG) BY MOUTH DAILY Rice, Jamesetta Orleans, MD Taking Active Self, Pharmacy Records  fluticasone furoate-vilanterol (BREO ELLIPTA) 100-25 MCG/ACT AEPB [id number] Yes INHALE 1 PUFF INTO THE LUNGS DAILY Oretha Milch, MD Taking Active Self, Pharmacy Records  gabapentin (NEURONTIN) 100 MG capsule [id number] Yes Take 2 capsules (200 mg total) by mouth at bedtime. Windell Norfolk, MD Taking Active Self, Pharmacy Records  levalbuterol [facility]) 0.63 MG/3ML nebulizer solution [id number] Yes Take 3 mLs (0.63 mg total) by nebulization every 6 (six) hours as needed for wheezing or shortness of breath. DX J44.89 J96.21 Oretha Milch, MD Taking Active Self, Pharmacy Records  Magnesium 500 MG TABS [id number] Yes Take 500 mg by mouth at bedtime. [provider] Taking Active Self, Pharmacy Records  nystatin (MYCOSTATIN) 100000 UNIT/ML suspension [id number] Yes Take 5 mLs (500,000 Units total) by mouth 4 (four) times daily. Almon Hercules, MD Taking Active Self, Pharmacy Records  OXYGEN [id number] Yes Inhale 5 L into the lungs continuous. Use with resmed ventilator [provider] Taking Active Self, Pharmacy Records  pantoprazole (PROTONIX) 40 MG tablet [id number] Yes Take 1 tablet (40 mg total) by mouth daily. Almon Hercules, MD Taking Active Self, Pharmacy Records  Med Note [facility], KATIE   Thu Apr 08, 2023 10:53 AM)    Plecanatide (TRULANCE) 3 MG TABS [id number] No  Take 1 tablet (3 mg total) by mouth daily.  Patient not taking: Reported on 04/09/2023   Mansouraty, Netty Starring., MD Not Taking Active Self, Pharmacy Records  potassium chloride SA (KLOR-CON M) 20 MEQ tablet [id number] Yes Take 2 tablets (40 mEq total) by mouth daily. Danford, Earl Lites, MD Taking Active   predniSONE (DELTASONE) 10 MG tablet [id number] Yes Take 4 tablets (40 mg total) by mouth daily for 2 days, THEN 3 tablets (30 mg total) daily for 2 days, THEN 3 tablets (30 mg total) daily for 2 days, THEN 2 tablets (20 mg total) daily for 2 days, THEN 1 tablet (10 mg total) daily for 2 days, THEN 0.5 tablets (5 mg total) daily for 2 days. Almon Hercules, MD Taking Active Self, Pharmacy Records  revefenacin [facility]) 175 MCG/3ML nebulizer solution [id number] Yes Take 175 mcg by nebulization daily. [provider] Taking Active Self, Pharmacy Records  rivaroxaban (XARELTO) 20 MG TABS tablet [id number] Yes Take 1 tablet (20 mg total) by mouth daily with supper. Bensimhon, [name], MD Taking Active Self, Pharmacy Records  spironolactone (ALDACTONE) 25 MG tablet [id number] Yes Take 1 tablet (25 mg total) by mouth 2 (two) times daily. Alberteen Sam, MD Taking Active   torsemide (DEMADEX) 20 MG tablet [id number] Yes Take 2 tablets (40 mg total) by mouth daily. Danford, Earl Lites, MD Taking Active   Med List Note Kandis Cocking Ronnald Nian, CPhT 04/09/23 1700): [facility]            Home Care and Equipment/Supplies: Were Home Health Services Ordered?: [facility] Any new equipment or medical supplies ordered?: No  Functional Questionnaire: Do you need assistance with bathing/showering or dressing?: Yes Do you need assistance with meal preparation?: Yes Do you need assistance with eating?: No Do you have difficulty maintaining continence: No Do you need assistance with getting out of bed/getting out of a chair/moving?:  No Do you have difficulty managing or taking your medications?: No  Follow up appointments reviewed: PCP Follow-up appointment confirmed?: No (Patient says she does not want to make appt with her PCP since she is seeing so many specialists) MD Provider Line Number:[phone] Given: No Specialist Hospital Follow-up appointment confirmed?: Yes Date of Specialist follow-up appointment?: 04/19/23 Follow-Up Specialty Provider:: CHF Clinic Do you need transportation to your follow-up appointment?: No Do you understand care options if your condition(s) worsen?: Yes-patient verbalized understanding  SDOH Interventions Today    Flowsheet Row Most Recent Value  SDOH Interventions   Food Insecurity Interventions Intervention Not Indicated  Housing Interventions Intervention Not Indicated  Transportation Interventions Intervention Not Indicated  Utilities Interventions Intervention Not Indicated     Follow up appointment made with Kathyrn Sheriff, RN  04/22/23@3PM  Jodelle Gross RN, BSN, CCM RN Care Manager  Transitions of Care  [place] [facility]  [phone]

## 2023-04-14 NOTE — Telephone Encounter (Signed)
H&V Care Navigation CSW Progress Note  Clinical Social Worker called pt to assess for transportation concerns.  Patient transportation at retirement home is no longer available and she has appt on Monday.  She will speak to her son to see if he can take off work but if not she will need assistance- will plan to have CSW follow up with pt tomorrow afternoon to see if she will need assistance.   SDOH Screenings   Food Insecurity: No Food Insecurity (04/14/2023)  Housing: Low Risk  (04/14/2023)  Transportation Needs: No Transportation Needs (04/14/2023)  Utilities: Not At Risk (04/14/2023)  Alcohol Screen: Low Risk  (05/21/2022)  Depression (PHQ2-9): Low Risk  (05/21/2022)  Financial Resource Strain: Low Risk  (05/21/2022)  Physical Activity: Insufficiently Active (05/21/2022)  Social Connections: Moderately Isolated (05/21/2022)  Stress: No Stress Concern Present (05/21/2022)  Tobacco Use: Medium Risk (04/09/2023)   Burna Sis, LCSW Clinical Social Worker Advanced Heart Failure Clinic Desk#: [phone] Cell#: [phone]

## 2023-04-15 ENCOUNTER — Other Ambulatory Visit: Payer: Medicare Other

## 2023-04-16 ENCOUNTER — Other Ambulatory Visit (HOSPITAL_COMMUNITY): Payer: Self-pay

## 2023-04-16 ENCOUNTER — Encounter: Payer: Self-pay | Admitting: Gastroenterology

## 2023-04-16 NOTE — Telephone Encounter (Signed)
Pharmacy Patient Advocate Encounter  Received notification from [facility] that Prior Authorization for Trulance 3MG  tablets has been APPROVED from 06/08/2022 to 06/07/2024   PA #/Case ID/Reference #: [id number]

## 2023-04-19 ENCOUNTER — Ambulatory Visit (HOSPITAL_BASED_OUTPATIENT_CLINIC_OR_DEPARTMENT_OTHER)
Admission: RE | Admit: 2023-04-19 | Discharge: 2023-04-19 | Disposition: A | Discharge status: Home / Self Care | Payer: Medicare Other | Source: Ambulatory Visit | Attending: Adult Health | Admitting: Adult Health

## 2023-04-19 ENCOUNTER — Other Ambulatory Visit (HOSPITAL_COMMUNITY): Payer: Self-pay

## 2023-04-19 VITALS — BP 112/72 | HR 91 | Wt 183.1 lb

## 2023-04-19 DIAGNOSIS — Q320 Congenital tracheomalacia: Secondary | ICD-10-CM | POA: Insufficient documentation

## 2023-04-19 DIAGNOSIS — I5022 Chronic systolic (congestive) heart failure: Secondary | ICD-10-CM

## 2023-04-19 DIAGNOSIS — G4733 Obstructive sleep apnea (adult) (pediatric): Secondary | ICD-10-CM | POA: Insufficient documentation

## 2023-04-19 DIAGNOSIS — N189 Chronic kidney disease, unspecified: Secondary | ICD-10-CM | POA: Insufficient documentation

## 2023-04-19 DIAGNOSIS — Z9981 Dependence on supplemental oxygen: Secondary | ICD-10-CM | POA: Insufficient documentation

## 2023-04-19 DIAGNOSIS — Z01818 Encounter for other preprocedural examination: Secondary | ICD-10-CM | POA: Diagnosis not present

## 2023-04-19 DIAGNOSIS — K5720 Diverticulitis of large intestine with perforation and abscess without bleeding: Secondary | ICD-10-CM | POA: Diagnosis not present

## 2023-04-19 DIAGNOSIS — I428 Other cardiomyopathies: Secondary | ICD-10-CM | POA: Insufficient documentation

## 2023-04-19 DIAGNOSIS — K5732 Diverticulitis of large intestine without perforation or abscess without bleeding: Secondary | ICD-10-CM | POA: Insufficient documentation

## 2023-04-19 DIAGNOSIS — N321 Vesicointestinal fistula: Secondary | ICD-10-CM | POA: Insufficient documentation

## 2023-04-19 DIAGNOSIS — J8489 Other specified interstitial pulmonary diseases: Secondary | ICD-10-CM | POA: Insufficient documentation

## 2023-04-19 DIAGNOSIS — I4811 Longstanding persistent atrial fibrillation: Secondary | ICD-10-CM | POA: Insufficient documentation

## 2023-04-19 DIAGNOSIS — Z950 Presence of cardiac pacemaker: Secondary | ICD-10-CM | POA: Insufficient documentation

## 2023-04-19 DIAGNOSIS — R9431 Abnormal electrocardiogram [ECG] [EKG]: Secondary | ICD-10-CM | POA: Insufficient documentation

## 2023-04-19 DIAGNOSIS — J9610 Chronic respiratory failure, unspecified whether with hypoxia or hypercapnia: Secondary | ICD-10-CM | POA: Insufficient documentation

## 2023-04-19 DIAGNOSIS — I4819 Other persistent atrial fibrillation: Secondary | ICD-10-CM | POA: Insufficient documentation

## 2023-04-19 DIAGNOSIS — I130 Hypertensive heart and chronic kidney disease with heart failure and stage 1 through stage 4 chronic kidney disease, or unspecified chronic kidney disease: Secondary | ICD-10-CM | POA: Insufficient documentation

## 2023-04-19 LAB — BASIC METABOLIC PANEL
Anion gap: 13 (ref 5–15)
BUN: 20 mg/dL (ref 8–23)
CO2: 30 mmol/L (ref 22–32)
Calcium: 10.5 mg/dL — ABNORMAL HIGH (ref 8.9–10.3)
Chloride: 94 mmol/L — ABNORMAL LOW (ref 98–111)
Creatinine, Ser: 0.81 mg/dL (ref 0.44–1.00)
GFR, Estimated: >60 mL/min (ref >60)
Glucose, Bld: 164 mg/dL — ABNORMAL HIGH (ref 70–99)
Potassium: 3.9 mmol/L (ref 3.5–5.1)
Sodium: 137 mmol/L (ref 135–145)

## 2023-04-19 LAB — MAGNESIUM: Magnesium: 1.7 mg/dL (ref 1.7–2.4)

## 2023-04-19 NOTE — Progress Notes (Signed)
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    She was seen in the HF clinic 03/05/23. Dry at that time so diuretics held x2 days then torsemide was restarted at 20 mg daily with weekly metolazone.    On 03/17/23. Optivole suggestive of fluid accumulation. Torsemide was increased 40 mg daily and K Dure increased to 40 meq daily.    Admitted to [facility] by [facility] 03/26/23 with AECOPD/fatigue. Treated with antibiotics, steroids, and breathing treatments. GI/Pulmonary consulted. CT abdomen and pelvis on 10/24 with interval decrease in previously visualized free fluid anterior to the distal sigmoid colon and in the cul-de-sac and colovaginal fistula. GI recommended completing 14 days of antibiotics. Discharged 04/04/23 on a higher dose of torsemide 60 mg daily + metolazone once a week. Potassium was increased for 2 days then 40 meq daily on metolazone days.   Readmitted 04/09/23 with hypokalemia. K 2.5, creatinine 1.14, chloride 89, BNP 48 .  CXR chronic lung changes and small left pleural effsuon. CT abd/pelvis with colon diverticulitis and persistent colovaginal fistula. Completed antibiotic course. Diuretics held then restarted at discharge. She was placed torsemide 40 mg daily + 40 meq KDur. Metolazone was not started. Discharge 04/13/23 with weight 172 pounds.   Today she returns for post hospital follow up with her son. Overall feeling a little stronger and breathing better than she has in some time.  Remains on oxygen. SOB with exertion but says this is her baseline. Occasionally dizzy. Denies PND/Orthopnea. Appetite ok. No fever or chills. Having formed bowel movements from rectum. Minimal output from vagina. Weight at  home 175  pounds. Taking all medications. Followed by HF Paramedicine.     Cardiac Studies - Echo 4/24 EF 35-40% RV ok  - Echo 10/23 and 11/23 EF 35-40% (Dr. Gala Romney felt 40-45%). Having episodes of CP that can wake her up from sleep. No exertional CP.   R/LHC (10/23): LAD 25% Cx 10%  PA 41/15 (28) PCWP 13 Fick 5.3/2.8   ROS: All systems negative except as listed in HPI, PMH and Problem List.  SH:  Social History   Socioeconomic History   Marital status: Widowed    Spouse name: Not on file   Number of children: 1   Years of education: Not on file   Highest education level: Not on file  Occupational History   Occupation: retired  Tobacco Use   Smoking status: Former    Current packs/day: 0.00    Average packs/day: 1 pack/day for 20.0 years (20.0 ttl pk-yrs)    Types: Cigarettes    Start date: 06/09/1967    Quit date: 06/09/1987    Years since quitting: 35.8    Passive exposure: Past   Smokeless tobacco: Never   Tobacco comments:    Former smoker 12/25/21  Vaping Use   Vaping status: Never Used  Substance and Sexual Activity   Alcohol use: No    Alcohol/week: 0.0 standard drinks of alcohol   Drug use: No   Sexual activity: Not Currently  Other Topics Concern   Not on file  Social History Narrative   Pt lives in [place] alone.  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  Her son works in [employer].   Social Determinants of Health   Financial Resource Strain: Low Risk  (05/21/2022)   Overall Financial Resource Strain (CARDIA)    Difficulty of Paying Living Expenses: Not very hard  Food Insecurity: No Food Insecurity (04/14/2023)   Hunger Vital Sign    Worried About Running Out of Food in the Last Year: Never true    Ran Out of Food in the Last Year: Never true  Transportation Needs: No Transportation Needs (04/14/2023)   PRAPARE - Administrator, Civil Service (Medical): No    Lack of Transportation (Non-Medical): No  Physical Activity:  Insufficiently Active (05/21/2022)   Exercise Vital Sign    Days of Exercise per Week: 2 days    Minutes of Exercise per Session: 20 min  Stress: No Stress Concern Present (05/21/2022)   [employer] of Occupational Health - Occupational Stress Questionnaire    Feeling of Stress : Only a little  Social Connections: Moderately Isolated (05/21/2022)   Social Connection and Isolation Panel [NHANES]    Frequency of Communication with Friends and Family: More than three times a week    Frequency of Social Gatherings with Friends and Family: More than three times a week    Attends Religious Services: Never    Database administrator or Organizations: Yes    Attends Engineer, structural: More than 4 times per year    Marital Status: Widowed  Intimate Partner Violence: Not At Risk (04/09/2023)   Humiliation, Afraid, Rape, and Kick questionnaire    Fear of Current or Ex-Partner: No    Emotionally Abused: No    Physically Abused: No    Sexually Abused: No    FH:  Family History  Problem Relation Age of Onset   Breast cancer Mother 37       3 different times   Pancreatic cancer Mother    Emphysema Father    Healthy Brother    Breast cancer Cousin    Valvular heart disease Son    Obesity Daughter    Colon cancer Neg Hx    Esophageal cancer Neg Hx    Stomach cancer Neg Hx    Inflammatory bowel disease Neg Hx    Liver disease Neg Hx    Rectal cancer Neg Hx     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     amoxicillin-clavulanate (AUGMENTIN) 875-125 MG tablet Take 1 tablet by mouth every 12 (twelve) hours. 20 tablet 0   azelastine (ASTELIN) 0.1 % nasal spray Place 2 sprays into both nostrils 2 (two) times daily. 90 mL 0   Cholecalciferol (VITAMIN D3) 50 MCG (2000 UT) TABS Take 2,000 Units by mouth at bedtime.     cyanocobalamin (VITAMIN B12) 1000 MCG/ML injection  Inject 1 mL (1,000 mcg total) into the muscle every 30 (thirty) days. 10 mL 0   docusate sodium (COLACE) 100 MG capsule Take 100 mg by mouth 2 (two) times daily.     dofetilide (TIKOSYN) 250 MCG capsule Take 1 capsule (250 mcg total) by mouth in the morning and at bedtime. 60 capsule 6   febuxostat (ULORIC) 40 MG tablet TAKE 1 TABLET(40 MG) BY MOUTH DAILY 90 tablet 0   fluticasone furoate-vilanterol (BREO ELLIPTA) 100-25 MCG/ACT AEPB INHALE 1 PUFF INTO THE LUNGS DAILY 60 each 11   gabapentin (NEURONTIN) 100 MG capsule Take 2 capsules (200 mg total) by mouth at bedtime. 60 capsule 6   levalbuterol (XOPENEX) 0.63 MG/3ML nebulizer solution Take 3 mLs (0.63 mg total) by nebulization every 6 (six) hours as needed for wheezing or shortness of breath. DX J44.89 J96.21 3 mL 12   Magnesium 500 MG TABS Take 500 mg by mouth at bedtime.     nystatin (MYCOSTATIN) 100000 UNIT/ML suspension Take 5 mLs (500,000 Units total) by mouth 4 (four) times daily. 60 mL 0   OXYGEN Inhale 5 L into the lungs continuous. Use with resmed ventilator     pantoprazole (PROTONIX) 40 MG tablet Take 1 tablet (40 mg total) by mouth daily. 30 tablet 0   potassium chloride SA (KLOR-CON M) 20 MEQ tablet Take 2 tablets (40 mEq total) by mouth daily. 30 tablet 0   revefenacin (YUPELRI) 175 MCG/3ML nebulizer solution Take 175 mcg by nebulization daily.     rivaroxaban (XARELTO) 20 MG  TABS tablet Take 1 tablet (20 mg total) by mouth daily with supper. 30 tablet 5   spironolactone (ALDACTONE) 25 MG tablet Take 1 tablet (25 mg total) by mouth 2 (two) times daily. 60 tablet 0   torsemide (DEMADEX) 20 MG tablet Take 2 tablets (40 mg total) by mouth daily. 60 tablet 0   Plecanatide (TRULANCE) 3 MG TABS Take 1 tablet (3 mg total) by mouth daily. (Patient not taking: Reported on 04/09/2023) 30 tablet 6   No current facility-administered medications for this encounter.    Vitals:   04/19/23 1431  BP: 112/72  Pulse: 91  SpO2: 96%  Weight: 83.1 kg (183 lb 2 oz)   Wt Readings from Last 3 Encounters:  04/19/23 83.1 kg (183 lb 2 oz)  04/12/23 80.9 kg (178 lb 6.4 oz)  04/08/23 80.7 kg (178 lb)    PHYSICAL EXAM: General:  . No resp difficulty HEENT: normal Neck: supple. JVP flat. Carotids 2+ bilaterally; no bruits. No lymphadenopathy or thryomegaly appreciated. Cor: PMI normal. Regular rate & rhythm. No rubs, gallops or murmurs. Lungs: clear Abdomen: obese, soft, nontender, nondistended. No hepatosplenomegaly. No bruits or masses. Good bowel sounds. Extremities: no cyanosis, clubbing, rash, edema Neuro: alert & orientedx3, cranial nerves grossly intact. Moves all 4 extremities w/o difficulty. Affect pleasant.   ECG: V paced 75 QT 386/ QTc 431 Kathleen    ASSESSMENT & PLAN: 1. Chronic systolic CHF: Nonischemic cardiomyopathy.  Last echo in 4/24 with EF 35-40%, RV normal.  Has AV nodal ablation with left bundle lead.  - NYHA III - Did not tolerate Entresto in past due to low BP. Hold off on losartan. Concerned this will make dizziness worse.    - Volume status stable. Continue torsemide 40 mg daily.  - Check BMET  2. BOOP with tracheobronchomalacia: On 3-4 L oxygen at home, 4L [place] today.  3. Atrial fibrillation: Persistent.  Has had AV nodal ablation.  She is  atrial pacing.  She is on Tikosyn. QTc on ECG today not significantly prolonged.  - Continue Tikosyn, need to keep K  within normal range.  - Continue Xarelto.  4 . Diverticulitis with colovesical fistula. -Seems to be slowing down.  - Ultimately, needs surgical repair.  5. Preop Clearance  Appears euvolemic. She does not appear decompensated.  Revised Cardiac Index = 2 points ---> 10.1 30 day risk of death, MI , or cardiac arrest.      Signed, Beth Goodlin  NP-C  04/19/2023, 5:00 PM   [facility] [address] [facility] [place] [address] [phone] (office) [phone] (fax)    Kathleen [name] 5:00 PM

## 2023-04-19 NOTE — Patient Instructions (Signed)
Medication Changes:  None, continue current medications  Lab Work:  Labs done today, your results will be available in MyChart, we will contact you for abnormal readings.   Special Instructions // Education:  Do the following things EVERYDAY: Weigh yourself in the morning before breakfast. Write it down and keep it in a log. Take your medicines as prescribed Eat low salt foods--Limit salt (sodium) to 2000 mg per day.  Stay as active as you can everyday Limit all fluids for the day to less than 2 liters   Follow-Up in: 2 months with Dr Gala Romney, **we will call you to schedule this closer to this time   At the [facility], you and your health needs are our priority. We have a designated team specialized in the treatment of Heart Failure. This Care Team includes your primary Heart Failure Specialized Cardiologist (physician), Advanced Practice Providers (APPs- Physician Assistants and Nurse Practitioners), and Pharmacist who all work together to provide you with the care you need, when you need it.   You may see any of the following providers on your designated Care Team at your next follow up:  Dr. Arvilla Meres Dr. Marca Ancona Dr. Dorthula Nettles Dr. Theresia Bough Tonye Becket, NP Robbie Lis, [place] [facility] [place], [place] Brynda Peon, NP [name], NP Karle Plumber, PharmD   Please be sure to bring in all your medications bottles to every appointment.   Need to Contact us:  If you have any questions or concerns before your next appointment please send [place] a message through [place] or call our office at [phone].    TO LEAVE A MESSAGE FOR THE NURSE SELECT OPTION 2, PLEASE LEAVE A MESSAGE INCLUDING: YOUR NAME DATE OF BIRTH CALL BACK NUMBER REASON FOR CALL**this is important as we prioritize the call backs  YOU WILL RECEIVE A CALL BACK THE SAME DAY AS LONG AS YOU CALL BEFORE 4:00 PM

## 2023-04-19 NOTE — Progress Notes (Signed)
Paramedicine Encounter   Patient ID: AURIANA MCGUGAN , female,   DOB: 07-03-1945,77 y.o.,  MRN: [id number]   Met patient in clinic today with provider.  Weight @ clinic-183 B/P-112/72 P-90 [id number] Weights at home from 175-182 fluctuating.  Med changes-during the d/c she was taken off doxy, mucinex, metolazone, flagyl  -spiro BID, decreased torsemide to 40mg  daily and potassium daily   She was recently d/c from hosp with low K. She reports feeling better, but still dizzy in the morning but then it wears off by lunchtime after she takes her morning doses.  She isnt sure if she is taking spiro once or BID. Will check on that when I see her this week.   Labs and EKG done today in clinic.    Kerry Hough, EMT-Paramedic [phone] 04/19/2023

## 2023-04-21 ENCOUNTER — Telehealth: Payer: Self-pay | Admitting: Gastroenterology

## 2023-04-21 ENCOUNTER — Other Ambulatory Visit (HOSPITAL_COMMUNITY): Payer: Self-pay

## 2023-04-21 NOTE — Progress Notes (Signed)
Came out post clinic visit per amy to verify how she is taking her spiro.  Her pill box is not filled up, so I can't check that.  She did begin to fill it while I was there and I told her to be cautious in following what was on bottle, to follow most recent d/c papers from provider to ensure no med dose changes were missed and explained the new directions of any changed meds would come after the new rx was p/u from pharmacy.   Her d/c paper say once a day however the chart to show when to take it only checked once for morning. So there was that confusion. But her bottle directions did say BID.   I will let amy know and asked if she needed to come back for labs shortly after starting the spiro BID.     Also needed to check her magnesium dosage. Per lab note she is to increase her magnesium to BID. So that was relayed to her also.

## 2023-04-21 NOTE — Telephone Encounter (Signed)
Inbound call from patient stating she has been having severe abdominal pain since yesterday 11/12. Patient believes her diverticulitis is inflamed and is unsure if she should proceed with CT scheduled for tomorrow. Patient requesting a call to be advised further. Please advise. Thank you.

## 2023-04-21 NOTE — Telephone Encounter (Signed)
Spoke with the pt and she tells me that she is not sure she is able to go for her CT scan tomorrow.  She says she may not have a ride. I did advise that she should keep the appt if at all possible. This will help [place] determine if she can proceed with colon as planned. She states she will speak with her son and try to make a way to get there. She will call back if there are any further issues

## 2023-04-22 ENCOUNTER — Telehealth: Payer: Self-pay | Admitting: Gastroenterology

## 2023-04-22 ENCOUNTER — Inpatient Hospital Stay (HOSPITAL_COMMUNITY)
Admission: EM | Admit: 2023-04-22 | Discharge: 2023-05-27 | DRG: 329 | Disposition: A | Discharge status: Other Acute Inpatient Hospital | Payer: Medicare Other | Attending: Internal Medicine | Admitting: Internal Medicine

## 2023-04-22 ENCOUNTER — Telehealth (HOSPITAL_COMMUNITY): Payer: Self-pay

## 2023-04-22 ENCOUNTER — Encounter (HOSPITAL_COMMUNITY): Payer: Self-pay

## 2023-04-22 ENCOUNTER — Emergency Department (HOSPITAL_COMMUNITY): Payer: Medicare Other

## 2023-04-22 ENCOUNTER — Other Ambulatory Visit: Payer: Self-pay

## 2023-04-22 ENCOUNTER — Telehealth: Payer: Self-pay

## 2023-04-22 ENCOUNTER — Ambulatory Visit (HOSPITAL_COMMUNITY)
Admission: RE | Admit: 2023-04-22 | Discharge: 2023-04-22 | Disposition: A | Discharge status: Home / Self Care | Payer: Medicare Other | Source: Ambulatory Visit | Attending: Gastroenterology | Admitting: Gastroenterology

## 2023-04-22 DIAGNOSIS — R933 Abnormal findings on diagnostic imaging of other parts of digestive tract: Secondary | ICD-10-CM | POA: Diagnosis not present

## 2023-04-22 DIAGNOSIS — E785 Hyperlipidemia, unspecified: Secondary | ICD-10-CM | POA: Diagnosis present

## 2023-04-22 DIAGNOSIS — R1031 Right lower quadrant pain: Secondary | ICD-10-CM | POA: Diagnosis not present

## 2023-04-22 DIAGNOSIS — Z789 Other specified health status: Secondary | ICD-10-CM | POA: Diagnosis present

## 2023-04-22 DIAGNOSIS — Z66 Do not resuscitate: Secondary | ICD-10-CM | POA: Diagnosis not present

## 2023-04-22 DIAGNOSIS — K9189 Other postprocedural complications and disorders of digestive system: Secondary | ICD-10-CM | POA: Diagnosis not present

## 2023-04-22 DIAGNOSIS — E876 Hypokalemia: Secondary | ICD-10-CM | POA: Diagnosis present

## 2023-04-22 DIAGNOSIS — E66811 Obesity, class 1: Secondary | ICD-10-CM | POA: Diagnosis present

## 2023-04-22 DIAGNOSIS — Z825 Family history of asthma and other chronic lower respiratory diseases: Secondary | ICD-10-CM

## 2023-04-22 DIAGNOSIS — E1140 Type 2 diabetes mellitus with diabetic neuropathy, unspecified: Secondary | ICD-10-CM | POA: Diagnosis not present

## 2023-04-22 DIAGNOSIS — E440 Moderate protein-calorie malnutrition: Secondary | ICD-10-CM | POA: Diagnosis present

## 2023-04-22 DIAGNOSIS — I5043 Acute on chronic combined systolic (congestive) and diastolic (congestive) heart failure: Secondary | ICD-10-CM | POA: Diagnosis present

## 2023-04-22 DIAGNOSIS — A419 Sepsis, unspecified organism: Principal | ICD-10-CM

## 2023-04-22 DIAGNOSIS — K229 Disease of esophagus, unspecified: Secondary | ICD-10-CM | POA: Diagnosis not present

## 2023-04-22 DIAGNOSIS — I428 Other cardiomyopathies: Secondary | ICD-10-CM | POA: Diagnosis present

## 2023-04-22 DIAGNOSIS — I4891 Unspecified atrial fibrillation: Secondary | ICD-10-CM | POA: Diagnosis present

## 2023-04-22 DIAGNOSIS — Z87891 Personal history of nicotine dependence: Secondary | ICD-10-CM

## 2023-04-22 DIAGNOSIS — J441 Chronic obstructive pulmonary disease with (acute) exacerbation: Secondary | ICD-10-CM | POA: Diagnosis present

## 2023-04-22 DIAGNOSIS — I5042 Chronic combined systolic (congestive) and diastolic (congestive) heart failure: Secondary | ICD-10-CM | POA: Diagnosis present

## 2023-04-22 DIAGNOSIS — I498 Other specified cardiac arrhythmias: Secondary | ICD-10-CM | POA: Diagnosis not present

## 2023-04-22 DIAGNOSIS — K567 Ileus, unspecified: Secondary | ICD-10-CM | POA: Diagnosis not present

## 2023-04-22 DIAGNOSIS — Z881 Allergy status to other antibiotic agents status: Secondary | ICD-10-CM

## 2023-04-22 DIAGNOSIS — N823 Fistula of vagina to large intestine: Secondary | ICD-10-CM | POA: Diagnosis present

## 2023-04-22 DIAGNOSIS — R059 Cough, unspecified: Secondary | ICD-10-CM | POA: Diagnosis not present

## 2023-04-22 DIAGNOSIS — J9611 Chronic respiratory failure with hypoxia: Secondary | ICD-10-CM | POA: Diagnosis present

## 2023-04-22 DIAGNOSIS — E871 Hypo-osmolality and hyponatremia: Secondary | ICD-10-CM | POA: Diagnosis not present

## 2023-04-22 DIAGNOSIS — K651 Peritoneal abscess: Secondary | ICD-10-CM | POA: Diagnosis present

## 2023-04-22 DIAGNOSIS — Z9981 Dependence on supplemental oxygen: Secondary | ICD-10-CM

## 2023-04-22 DIAGNOSIS — K219 Gastro-esophageal reflux disease without esophagitis: Secondary | ICD-10-CM | POA: Diagnosis present

## 2023-04-22 DIAGNOSIS — D6489 Other specified anemias: Secondary | ICD-10-CM | POA: Diagnosis not present

## 2023-04-22 DIAGNOSIS — K5792 Diverticulitis of intestine, part unspecified, without perforation or abscess without bleeding: Secondary | ICD-10-CM | POA: Insufficient documentation

## 2023-04-22 DIAGNOSIS — I2510 Atherosclerotic heart disease of native coronary artery without angina pectoris: Secondary | ICD-10-CM | POA: Diagnosis present

## 2023-04-22 DIAGNOSIS — N824 Other female intestinal-genital tract fistulae: Secondary | ICD-10-CM | POA: Insufficient documentation

## 2023-04-22 DIAGNOSIS — Z6834 Body mass index (BMI) 34.0-34.9, adult: Secondary | ICD-10-CM

## 2023-04-22 DIAGNOSIS — Z01810 Encounter for preprocedural cardiovascular examination: Secondary | ICD-10-CM

## 2023-04-22 DIAGNOSIS — I5022 Chronic systolic (congestive) heart failure: Secondary | ICD-10-CM | POA: Diagnosis present

## 2023-04-22 DIAGNOSIS — Z950 Presence of cardiac pacemaker: Secondary | ICD-10-CM | POA: Diagnosis present

## 2023-04-22 DIAGNOSIS — B3781 Candidal esophagitis: Secondary | ICD-10-CM

## 2023-04-22 DIAGNOSIS — R1310 Dysphagia, unspecified: Secondary | ICD-10-CM | POA: Insufficient documentation

## 2023-04-22 DIAGNOSIS — E1122 Type 2 diabetes mellitus with diabetic chronic kidney disease: Secondary | ICD-10-CM | POA: Diagnosis present

## 2023-04-22 DIAGNOSIS — I959 Hypotension, unspecified: Secondary | ICD-10-CM | POA: Diagnosis present

## 2023-04-22 DIAGNOSIS — F05 Delirium due to known physiological condition: Secondary | ICD-10-CM | POA: Diagnosis not present

## 2023-04-22 DIAGNOSIS — J4489 Other specified chronic obstructive pulmonary disease: Secondary | ICD-10-CM | POA: Diagnosis present

## 2023-04-22 DIAGNOSIS — J8489 Other specified interstitial pulmonary diseases: Secondary | ICD-10-CM | POA: Diagnosis not present

## 2023-04-22 DIAGNOSIS — Z79899 Other long term (current) drug therapy: Secondary | ICD-10-CM

## 2023-04-22 DIAGNOSIS — K5720 Diverticulitis of large intestine with perforation and abscess without bleeding: Principal | ICD-10-CM | POA: Diagnosis present

## 2023-04-22 DIAGNOSIS — Z7951 Long term (current) use of inhaled steroids: Secondary | ICD-10-CM

## 2023-04-22 DIAGNOSIS — I4811 Longstanding persistent atrial fibrillation: Secondary | ICD-10-CM | POA: Diagnosis not present

## 2023-04-22 DIAGNOSIS — K611 Rectal abscess: Secondary | ICD-10-CM | POA: Diagnosis present

## 2023-04-22 DIAGNOSIS — Z9049 Acquired absence of other specified parts of digestive tract: Secondary | ICD-10-CM | POA: Diagnosis not present

## 2023-04-22 DIAGNOSIS — Z515 Encounter for palliative care: Secondary | ICD-10-CM | POA: Diagnosis not present

## 2023-04-22 DIAGNOSIS — I130 Hypertensive heart and chronic kidney disease with heart failure and stage 1 through stage 4 chronic kidney disease, or unspecified chronic kidney disease: Secondary | ICD-10-CM | POA: Diagnosis present

## 2023-04-22 DIAGNOSIS — M545 Low back pain, unspecified: Secondary | ICD-10-CM | POA: Diagnosis not present

## 2023-04-22 DIAGNOSIS — Z1152 Encounter for screening for COVID-19: Secondary | ICD-10-CM

## 2023-04-22 DIAGNOSIS — B372 Candidiasis of skin and nail: Secondary | ICD-10-CM | POA: Diagnosis not present

## 2023-04-22 DIAGNOSIS — D500 Iron deficiency anemia secondary to blood loss (chronic): Secondary | ICD-10-CM | POA: Diagnosis present

## 2023-04-22 DIAGNOSIS — I252 Old myocardial infarction: Secondary | ICD-10-CM

## 2023-04-22 DIAGNOSIS — K5900 Constipation, unspecified: Secondary | ICD-10-CM | POA: Diagnosis present

## 2023-04-22 DIAGNOSIS — I10 Essential (primary) hypertension: Secondary | ICD-10-CM | POA: Diagnosis not present

## 2023-04-22 DIAGNOSIS — Z7901 Long term (current) use of anticoagulants: Secondary | ICD-10-CM

## 2023-04-22 DIAGNOSIS — I272 Pulmonary hypertension, unspecified: Secondary | ICD-10-CM | POA: Diagnosis present

## 2023-04-22 DIAGNOSIS — Z91048 Other nonmedicinal substance allergy status: Secondary | ICD-10-CM

## 2023-04-22 DIAGNOSIS — E039 Hypothyroidism, unspecified: Secondary | ICD-10-CM | POA: Diagnosis present

## 2023-04-22 DIAGNOSIS — Z888 Allergy status to other drugs, medicaments and biological substances status: Secondary | ICD-10-CM

## 2023-04-22 DIAGNOSIS — E872 Acidosis, unspecified: Secondary | ICD-10-CM | POA: Diagnosis present

## 2023-04-22 DIAGNOSIS — Z90710 Acquired absence of both cervix and uterus: Secondary | ICD-10-CM

## 2023-04-22 DIAGNOSIS — G4730 Sleep apnea, unspecified: Secondary | ICD-10-CM | POA: Diagnosis present

## 2023-04-22 DIAGNOSIS — K5732 Diverticulitis of large intestine without perforation or abscess without bleeding: Secondary | ICD-10-CM | POA: Diagnosis not present

## 2023-04-22 DIAGNOSIS — G4733 Obstructive sleep apnea (adult) (pediatric): Secondary | ICD-10-CM | POA: Diagnosis present

## 2023-04-22 DIAGNOSIS — Z860100 Personal history of colon polyps, unspecified: Secondary | ICD-10-CM | POA: Insufficient documentation

## 2023-04-22 DIAGNOSIS — Z87820 Personal history of traumatic brain injury: Secondary | ICD-10-CM

## 2023-04-22 DIAGNOSIS — K5909 Other constipation: Secondary | ICD-10-CM | POA: Insufficient documentation

## 2023-04-22 DIAGNOSIS — I4821 Permanent atrial fibrillation: Secondary | ICD-10-CM | POA: Diagnosis present

## 2023-04-22 DIAGNOSIS — Z887 Allergy status to serum and vaccine status: Secondary | ICD-10-CM

## 2023-04-22 DIAGNOSIS — R110 Nausea: Secondary | ICD-10-CM | POA: Diagnosis not present

## 2023-04-22 DIAGNOSIS — R531 Weakness: Secondary | ICD-10-CM | POA: Diagnosis not present

## 2023-04-22 DIAGNOSIS — N1831 Chronic kidney disease, stage 3a: Secondary | ICD-10-CM | POA: Diagnosis present

## 2023-04-22 DIAGNOSIS — I110 Hypertensive heart disease with heart failure: Secondary | ICD-10-CM | POA: Diagnosis present

## 2023-04-22 DIAGNOSIS — I429 Cardiomyopathy, unspecified: Secondary | ICD-10-CM | POA: Diagnosis not present

## 2023-04-22 DIAGNOSIS — J449 Chronic obstructive pulmonary disease, unspecified: Secondary | ICD-10-CM | POA: Diagnosis present

## 2023-04-22 DIAGNOSIS — M1A9XX0 Chronic gout, unspecified, without tophus (tophi): Secondary | ICD-10-CM | POA: Diagnosis present

## 2023-04-22 LAB — COMPREHENSIVE METABOLIC PANEL
ALT: 21 U/L (ref 0–44)
AST: 28 U/L (ref 15–41)
Albumin: 2.9 g/dL — ABNORMAL LOW (ref 3.5–5.0)
Alkaline Phosphatase: 57 U/L (ref 38–126)
Anion gap: 13 (ref 5–15)
BUN: 19 mg/dL (ref 8–23)
CO2: 26 mmol/L (ref 22–32)
Calcium: 9 mg/dL (ref 8.9–10.3)
Chloride: 90 mmol/L — ABNORMAL LOW (ref 98–111)
Creatinine, Ser: 1.11 mg/dL — ABNORMAL HIGH (ref 0.44–1.00)
GFR, Estimated: 52 mL/min — ABNORMAL LOW (ref >60)
Glucose, Bld: 162 mg/dL — ABNORMAL HIGH (ref 70–99)
Potassium: 3.8 mmol/L (ref 3.5–5.1)
Sodium: 129 mmol/L — ABNORMAL LOW (ref 135–145)
Total Bilirubin: 1.2 mg/dL — ABNORMAL HIGH (ref <1.2)
Total Protein: 6.4 g/dL — ABNORMAL LOW (ref 6.5–8.1)

## 2023-04-22 LAB — CBC WITH DIFFERENTIAL/PLATELET
Abs Immature Granulocytes: 0.13 10*3/uL — ABNORMAL HIGH (ref 0.00–0.07)
Basophils Absolute: 0 10*3/uL (ref 0.0–0.1)
Basophils Relative: 0 %
Eosinophils Absolute: 0.1 10*3/uL (ref 0.0–0.5)
Eosinophils Relative: 1 %
HCT: 36.2 % (ref 36.0–46.0)
Hemoglobin: 11.6 g/dL — ABNORMAL LOW (ref 12.0–15.0)
Immature Granulocytes: 1 %
Lymphocytes Relative: 7 %
Lymphs Abs: 0.8 10*3/uL (ref 0.7–4.0)
MCH: 26.1 pg (ref 26.0–34.0)
MCHC: 32 g/dL (ref 30.0–36.0)
MCV: 81.3 fL (ref 80.0–100.0)
Monocytes Absolute: 0.9 10*3/uL (ref 0.1–1.0)
Monocytes Relative: 8 %
Neutro Abs: 9.9 10*3/uL — ABNORMAL HIGH (ref 1.7–7.7)
Neutrophils Relative %: 83 %
Platelets: 246 10*3/uL (ref 150–400)
RBC: 4.45 MIL/uL (ref 3.87–5.11)
RDW: 15.4 % (ref 11.5–15.5)
WBC: 11.9 10*3/uL — ABNORMAL HIGH (ref 4.0–10.5)
nRBC: 0 % (ref 0.0–0.2)

## 2023-04-22 LAB — APTT: aPTT: 42 s — ABNORMAL HIGH (ref 24–36)

## 2023-04-22 LAB — LACTIC ACID, PLASMA: Lactic Acid, Venous: 1.1 mmol/L (ref 0.5–1.9)

## 2023-04-22 LAB — RESP PANEL BY RT-PCR (RSV, FLU A&B, COVID)  RVPGX2
Influenza A by PCR: NEGATIVE
Influenza B by PCR: NEGATIVE
Resp Syncytial Virus by PCR: NEGATIVE
SARS Coronavirus 2 by RT PCR: NEGATIVE

## 2023-04-22 LAB — TROPONIN I (HIGH SENSITIVITY)
Troponin I (High Sensitivity): 11 ng/L (ref <18)
Troponin I (High Sensitivity): 15 ng/L (ref <18)

## 2023-04-22 LAB — BRAIN NATRIURETIC PEPTIDE: B Natriuretic Peptide: 82.7 pg/mL (ref 0.0–100.0)

## 2023-04-22 LAB — I-STAT CG4 LACTIC ACID, ED: Lactic Acid, Venous: 1.8 mmol/L (ref 0.5–1.9)

## 2023-04-22 LAB — MAGNESIUM: Magnesium: 1.9 mg/dL (ref 1.7–2.4)

## 2023-04-22 LAB — LIPASE, BLOOD: Lipase: 30 U/L (ref 11–51)

## 2023-04-22 LAB — CBG MONITORING, ED: Glucose-Capillary: 136 mg/dL — ABNORMAL HIGH (ref 70–99)

## 2023-04-22 LAB — PROTIME-INR
INR: 1.7 — ABNORMAL HIGH (ref 0.8–1.2)
Prothrombin Time: 20 s — ABNORMAL HIGH (ref 11.4–15.2)

## 2023-04-22 MED ORDER — IOHEXOL 300 MG/ML  SOLN
30.0000 mL | Freq: Once | INTRAMUSCULAR | Status: AC | PRN
  Administered 2023-04-22: 30 mL via ORAL

## 2023-04-22 MED ORDER — FLUTICASONE FUROATE-VILANTEROL 100-25 MCG/ACT IN AEPB
1.0000 | INHALATION_SPRAY | Freq: Every day | RESPIRATORY_TRACT | Status: DC
  Administered 2023-04-23 – 2023-05-27 (×31): 1 via RESPIRATORY_TRACT
  Filled 2023-04-22 (×4): qty 28

## 2023-04-22 MED ORDER — ACETAMINOPHEN 650 MG RE SUPP: 650.0000 mg | Freq: Four times a day (QID) | RECTAL | Status: DC | PRN

## 2023-04-22 MED ORDER — ACETAMINOPHEN 325 MG PO TABS: 650.0000 mg | ORAL_TABLET | Freq: Once | ORAL | Status: DC

## 2023-04-22 MED ORDER — HYDROMORPHONE HCL 1 MG/ML IJ SOLN: 0.5000 mg | INTRAMUSCULAR | Status: DC | PRN

## 2023-04-22 MED ORDER — ONDANSETRON HCL 4 MG/2ML IJ SOLN
4.0000 mg | Freq: Once | INTRAMUSCULAR | Status: AC
  Administered 2023-04-22: 4 mg via INTRAVENOUS
  Filled 2023-04-22: qty 2

## 2023-04-22 MED ORDER — PANTOPRAZOLE SODIUM 40 MG PO TBEC
40.0000 mg | DELAYED_RELEASE_TABLET | Freq: Every day | ORAL | Status: DC
  Administered 2023-04-23 – 2023-04-27 (×5): 40 mg via ORAL
  Filled 2023-04-22 (×5): qty 1

## 2023-04-22 MED ORDER — PIPERACILLIN-TAZOBACTAM 3.375 G IVPB
3.3750 g | Freq: Three times a day (TID) | INTRAVENOUS | Status: AC
  Administered 2023-04-23 – 2023-05-02 (×30): 3.375 g via INTRAVENOUS
  Filled 2023-04-22 (×29): qty 50

## 2023-04-22 MED ORDER — GABAPENTIN 100 MG PO CAPS
200.0000 mg | ORAL_CAPSULE | Freq: Every day | ORAL | Status: DC
  Administered 2023-04-22 – 2023-04-26 (×5): 200 mg via ORAL
  Filled 2023-04-22 (×5): qty 2

## 2023-04-22 MED ORDER — POTASSIUM CHLORIDE IN NACL 20-0.9 MEQ/L-% IV SOLN
INTRAVENOUS | Status: DC
  Filled 2023-04-22: qty 1000

## 2023-04-22 MED ORDER — OXYCODONE HCL 5 MG PO TABS
5.0000 mg | ORAL_TABLET | ORAL | Status: DC | PRN
  Administered 2023-04-23: 5 mg via ORAL
  Filled 2023-04-22: qty 1

## 2023-04-22 MED ORDER — INSULIN ASPART 100 UNIT/ML IJ SOLN: 0.0000 [IU] | INTRAMUSCULAR | Status: DC

## 2023-04-22 MED ORDER — HYDROMORPHONE HCL 1 MG/ML IJ SOLN: 1.0000 mg | INTRAMUSCULAR | Status: DC | PRN

## 2023-04-22 MED ORDER — HYDROMORPHONE HCL 1 MG/ML IJ SOLN
0.5000 mg | INTRAMUSCULAR | Status: DC | PRN
  Administered 2023-04-23 – 2023-04-26 (×5): 0.5 mg via INTRAVENOUS
  Filled 2023-04-22 (×5): qty 0.5

## 2023-04-22 MED ORDER — SODIUM CHLORIDE 0.9 % IV SOLN
12.5000 mg | Freq: Four times a day (QID) | INTRAVENOUS | Status: DC | PRN
  Administered 2023-05-02 – 2023-05-11 (×4): 12.5 mg via INTRAVENOUS
  Filled 2023-04-22: qty 12.5
  Filled 2023-04-22 (×2): qty 0.5
  Filled 2023-04-22: qty 12.5
  Filled 2023-04-22 (×2): qty 0.5

## 2023-04-22 MED ORDER — ONDANSETRON HCL 4 MG PO TABS
4.0000 mg | ORAL_TABLET | Freq: Four times a day (QID) | ORAL | Status: DC | PRN
Start: 2023-04-22 — End: 2023-04-22

## 2023-04-22 MED ORDER — ONDANSETRON HCL 4 MG/2ML IJ SOLN: 4.0000 mg | Freq: Four times a day (QID) | INTRAMUSCULAR | Status: DC | PRN

## 2023-04-22 MED ORDER — FLUTICASONE FUROATE-VILANTEROL 100-25 MCG/ACT IN AEPB
1.0000 | INHALATION_SPRAY | Freq: Every day | RESPIRATORY_TRACT | Status: DC
  Filled 2023-04-22: qty 28

## 2023-04-22 MED ORDER — REVEFENACIN 175 MCG/3ML IN SOLN
175.0000 ug | Freq: Every day | RESPIRATORY_TRACT | Status: DC
Start: 2023-04-23 — End: 2023-05-27
  Administered 2023-04-23 – 2023-05-27 (×31): 175 ug via RESPIRATORY_TRACT
  Filled 2023-04-22 (×36): qty 3

## 2023-04-22 MED ORDER — PIPERACILLIN-TAZOBACTAM 3.375 G IVPB 30 MIN
3.3750 g | Freq: Once | INTRAVENOUS | Status: AC
  Administered 2023-04-22: 3.375 g via INTRAVENOUS
  Filled 2023-04-22: qty 50

## 2023-04-22 MED ORDER — DOFETILIDE 125 MCG PO CAPS
250.0000 ug | ORAL_CAPSULE | Freq: Two times a day (BID) | ORAL | Status: DC
  Administered 2023-04-22 – 2023-05-27 (×66): 250 ug via ORAL
  Filled 2023-04-22 (×2): qty 2
  Filled 2023-04-22: qty 1
  Filled 2023-04-22: qty 2
  Filled 2023-04-22: qty 1
  Filled 2023-04-22 (×3): qty 2
  Filled 2023-04-22: qty 1
  Filled 2023-04-22: qty 2
  Filled 2023-04-22: qty 1
  Filled 2023-04-22 (×4): qty 2
  Filled 2023-04-22 (×4): qty 1
  Filled 2023-04-22: qty 2
  Filled 2023-04-22: qty 1
  Filled 2023-04-22 (×4): qty 2
  Filled 2023-04-22: qty 1
  Filled 2023-04-22 (×4): qty 2
  Filled 2023-04-22: qty 1
  Filled 2023-04-22 (×4): qty 2
  Filled 2023-04-22 (×3): qty 1
  Filled 2023-04-22: qty 2
  Filled 2023-04-22: qty 1
  Filled 2023-04-22: qty 2
  Filled 2023-04-22 (×4): qty 1
  Filled 2023-04-22: qty 2
  Filled 2023-04-22 (×2): qty 1
  Filled 2023-04-22 (×2): qty 2
  Filled 2023-04-22 (×3): qty 1
  Filled 2023-04-22 (×2): qty 2
  Filled 2023-04-22 (×2): qty 1
  Filled 2023-04-22 (×3): qty 2
  Filled 2023-04-22: qty 1
  Filled 2023-04-22 (×2): qty 2
  Filled 2023-04-22 (×2): qty 1
  Filled 2023-04-22 (×4): qty 2
  Filled 2023-04-22 (×2): qty 1
  Filled 2023-04-22: qty 2

## 2023-04-22 MED ORDER — MORPHINE SULFATE (PF) 4 MG/ML IV SOLN
4.0000 mg | Freq: Once | INTRAVENOUS | Status: AC
  Administered 2023-04-22: 4 mg via INTRAVENOUS
  Filled 2023-04-22: qty 1

## 2023-04-22 MED ORDER — IOHEXOL 300 MG/ML  SOLN
100.0000 mL | Freq: Once | INTRAMUSCULAR | Status: AC | PRN
Start: 2023-04-22 — End: 2023-04-22
  Administered 2023-04-22: 100 mL via INTRAVENOUS

## 2023-04-22 MED ORDER — ACETAMINOPHEN 325 MG PO TABS
650.0000 mg | ORAL_TABLET | Freq: Four times a day (QID) | ORAL | Status: DC | PRN
  Administered 2023-04-23: 650 mg via ORAL
  Filled 2023-04-22: qty 2

## 2023-04-22 MED ORDER — POTASSIUM CHLORIDE CRYS ER 20 MEQ PO TBCR: 40.0000 meq | EXTENDED_RELEASE_TABLET | Freq: Every day | ORAL | Status: DC

## 2023-04-22 MED ORDER — SODIUM CHLORIDE 0.9 % IV BOLUS: 500.0000 mL | Freq: Once | INTRAVENOUS | Status: DC

## 2023-04-22 NOTE — Progress Notes (Signed)
ED Pharmacy Antibiotic Sign Off An antibiotic consult was received from an ED provider for pip/tazo per pharmacy dosing for sepsis secondary to intraabdominal infection. A chart review was completed to assess appropriateness.   The following one time order(s) were placed:  Zosyn 3.375 mg x1  Further antibiotic and/or antibiotic pharmacy consults should be ordered by the admitting provider if indicated.   Thank you for allowing pharmacy to be a part of this patient's care.   Rutherford Nail, PharmD PGY2 Critical Care Pharmacy Resident  04/22/23 6:53 PM

## 2023-04-22 NOTE — H&P (Signed)
PCP:   [name], MD    Chief Complaint:  CT scan with bowel perforation  HPI: This is a 77 year old female with past medical history of colovaginal fistula associated with diverticulitis diagnosed 9/16 on multiple rounds of oral antibiotics.  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 , and combined HFpEF/HFrEF, EF 35-40%.  Patient was recently admitted 11/1-11/5.  Surgery and gastroenterology following patient.  Patient ultimately needs surgery but she is high risk.  Per patient and she is abdominal pain started getting worse and worse.  Greatest in the left lower quadrant.  She additionally noted increasing weakness.  She has been running a temperature.  She endorses loss of nausea but no vomiting.  She reports constipation despite using Trulance.  She states the output from her colovesical vaginal fistula has decreased.  She has not eaten much in the last 3 days.  She had a routine CT scheduled for today by Dr. Meridee Score.  After that she was called and instructed to go to the ER as her CT abdomen/pelvis showed a bowel perforation.  In the ER patient hemodynamically stable Tmax 101.8.  Sodium 129, potassium 3.8, creatinine 1.1, CT abdomen pelvis shows worsening diverticulitis with thickening of bowels.  1.2 cm localized perforation.  5.6 cm perirectal abscess.  EKG sinus rhythm QTc 410.  Respiratory panel negative.  Blood cultures x 2 collected.  IV Zosyn started.  Surgery consult placed.  Admission requested.  Review of Systems:  Per HPI.  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    Concussion 10/04/2021   COPD (chronic obstructive pulmonary disease) (HCC)    Diverticulitis    Gout    Hypertension    Longstanding persistent atrial fibrillation (HCC)    on Xarelto   Nonischemic cardiomyopathy (HCC)    Obesity    Pacemaker    Spontaneous pneumothorax 2013   Past Surgical History:  Procedure Laterality Date   ABDOMINAL HYSTERECTOMY     APPENDECTOMY     ATRIAL FIBRILLATION ABLATION  07/20/2013   by Dr Christin Fudge   AV nodal ablation  11/01/2013   by Dr Christin Fudge, repeated by Dr Wilford Grist   BREAST BIOPSY Bilateral 1997   negative   CARDIAC CATHETERIZATION     CHOLECYSTECTOMY     HERNIA REPAIR     PACEMAKER INSERTION  06/2017   MDT Viva CRT-P implanted by Dr Christin Fudge after AV nodal ablation,  LV lead could not be placed   PACEMAKER INSERTION  05/2020   with lead bundle   RIGHT/LEFT HEART CATH AND CORONARY ANGIOGRAPHY N/A 03/20/2022   Procedure: RIGHT/LEFT HEART CATH AND CORONARY ANGIOGRAPHY;  Surgeon: [place], Peter M, MD;  Location: [facility] INVASIVE CV LAB;  Service: Cardiovascular;  Laterality: N/A;    Medications: Prior to Admission medications   Medication Sig Start Date End Date Taking? Authorizing Provider  acetaminophen (TYLENOL) 650 MG CR tablet Take 1,300 mg by mouth every 8 (eight) hours as needed for pain.    [provider]  amoxicillin-clavulanate (AUGMENTIN) 875-125 MG tablet Take 1 tablet by mouth every 12 (twelve) hours. 04/13/23   Joen Laura  P, MD  azelastine (ASTELIN) 0.1 % nasal spray Place 2 sprays into both nostrils 2 (two) times daily. 11/05/22   Oretha Milch, MD  Cholecalciferol (VITAMIN D3) 50 MCG (2000 UT) TABS Take 2,000 Units by mouth at bedtime.    [provider]  cyanocobalamin (VITAMIN B12) 1000 MCG/ML injection Inject 1 mL (1,000 mcg total) into the muscle every 30 (thirty) days. 05/05/22   [name], MD  docusate sodium (COLACE) 100 MG capsule Take 100 mg by mouth 2 (two) times daily.     [provider]  dofetilide (TIKOSYN) 250 MCG capsule Take 1 capsule (250 mcg total) by mouth in the morning and at bedtime. 12/01/22 12/01/23  Fenton, Clint R, PA  febuxostat (ULORIC) 40 MG tablet TAKE 1 TABLET(40 MG) BY MOUTH DAILY 04/05/23   Rice, Jamesetta Orleans, MD  fluticasone furoate-vilanterol (BREO ELLIPTA) 100-25 MCG/ACT AEPB INHALE 1 PUFF INTO THE LUNGS DAILY 03/23/23   Oretha Milch, MD  gabapentin (NEURONTIN) 100 MG capsule Take 2 capsules (200 mg total) by mouth at bedtime. 11/03/22 06/01/23  Windell Norfolk, MD  levalbuterol (XOPENEX) 0.63 MG/3ML nebulizer solution Take 3 mLs (0.63 mg total) by nebulization every 6 (six) hours as needed for wheezing or shortness of breath. DX J44.89 J96.21 01/20/23   Oretha Milch, MD  Magnesium 500 MG TABS Take 500 mg by mouth at bedtime.    [provider]  nystatin (MYCOSTATIN) 100000 UNIT/ML suspension Take 5 mLs (500,000 Units total) by mouth 4 (four) times daily. 04/04/23   Almon Hercules, MD  OXYGEN Inhale 5 L into the lungs continuous. Use with resmed ventilator    [provider]  pantoprazole (PROTONIX) 40 MG tablet Take 1 tablet (40 mg total) by mouth daily. 04/05/23   Almon Hercules, MD  Plecanatide (TRULANCE) 3 MG TABS Take 1 tablet (3 mg total) by mouth daily. Patient not taking: Reported on 04/09/2023 04/06/23   Mansouraty, Netty Starring., MD  potassium chloride SA (KLOR-CON M) 20 MEQ tablet Take 2 tablets (40 mEq total) by mouth daily. 04/14/23   Danford, Earl Lites, MD  revefenacin (YUPELRI) 175 MCG/3ML nebulizer solution Take 175 mcg by nebulization daily.    [provider]  rivaroxaban (XARELTO) 20 MG TABS tablet Take 1 tablet (20 mg total) by mouth daily with supper. 02/01/23   Bensimhon, [name], MD  spironolactone (ALDACTONE) 25 MG tablet Take 1 tablet (25 mg total) by mouth 2 (two) times daily. 04/13/23   Danford, Earl Lites, MD  torsemide (DEMADEX) 20 MG tablet Take 2 tablets (40 mg total) by mouth  daily. 04/14/23   Danford, Earl Lites, MD    Allergies:   Allergies  Allergen Reactions   Allopurinol Other (See Comments)    Reaction:  Dizziness    Clindamycin Anaphylaxis and Hives   Flublok [Influenza Vaccine Recombinant] Other (See Comments)    Fever 103 with no alternative explanation day after vaccine.  Clydie Braun Highfill FNP-C   Pneumococcal 13-Val Conj Vacc Itching, Swelling and Rash   Dronedarone Rash   Montelukast Other (See Comments)    Makes her loopy.   Brovana [Arformoterol]     Caused muscle pain   Budesonide     Caused extreme joint pain   Entresto [Sacubitril-Valsartan] Other (See Comments)    hypotension   Fosamax [Alendronate Sodium] Nausea Only   Jardiance [Empagliflozin]     Caused a vaginal infection   Meperidine Nausea And Vomiting   Microplegia Msa-Msg [Plegisol]  Loopy,diarrhea   Rosuvastatin Other (See Comments)    Reaction:  Muscle spasms    Tetracycline Hives   Adhesive [Tape] Rash   Lovastatin Rash and Other (See Comments)    Muscle Pain    Social History:  reports that she quit smoking about 35 years ago. Her smoking use included cigarettes. She started smoking about 55 years ago. She has a 20 pack-year smoking history. She has been exposed to tobacco smoke. She has never used smokeless tobacco. She reports that she does not drink alcohol and does not use drugs.  Family History: Family History  Problem Relation Age of Onset   Breast cancer Mother 75       3 different times   Pancreatic cancer Mother    Emphysema Father    Healthy Brother    Breast cancer Cousin    Valvular heart disease Son    Obesity Daughter    Colon cancer Neg Hx    Esophageal cancer Neg Hx    Stomach cancer Neg Hx    Inflammatory bowel disease Neg Hx    Liver disease Neg Hx    Rectal cancer Neg Hx     Physical Exam: Vitals:   04/22/23 1827 04/22/23 1829 04/22/23 1945  BP: 124/71  (!) 125/56  Pulse: 83  92  Resp: 20  19  Temp: (!) 101.8 F (38.8 C)     TempSrc: Oral    SpO2: 100%  100%  Weight:  79.4 kg   Height:  5' (1.524 m)     General: A&O x 3, w morbidly obese, no acute distress Eyes: Pink conjunctiva, no scleral icterus ENT: Moist oral mucosa, neck supple, no thyromegaly Lungs: CTA B/L, no wheeze, no crackles, no use of accessory muscles Cardiovascular: RRR, no regurgitation, no gallops, no murmurs. No carotid bruits, no JVD Abdomen: soft, decreased but present BS, bloated/distended abdomen, + generalized TTP greatest in the LLL,  GU: not examined Neuro: CN II - XII grossly intact, sensation intact Musculoskeletal: strength 5/5 all extremities, no clubbing, cyanosis or edema Skin: no rash, no subcutaneous crepitation, no decubitus Psych: appropriate patient  Labs on Admission:  Recent Labs    04/22/23 1840  NA 129*  K 3.8  CL 90*  CO2 26  GLUCOSE 162*  BUN 19  CREATININE 1.11*  CALCIUM 9.0   Recent Labs    04/22/23 1840  AST 28  ALT 21  ALKPHOS 57  BILITOT 1.2*  PROT 6.4*  ALBUMIN 2.9*   Recent Labs    04/22/23 1840  LIPASE 30   Recent Labs    04/22/23 1840  WBC 11.9*  NEUTROABS 9.9*  HGB 11.6*  HCT 36.2  MCV 81.3  PLT 246    Radiological Exams on Admission: CT ABDOMEN PELVIS W CONTRAST  Result Date: 04/22/2023 CLINICAL DATA:  Sigmoid diverticulitis. Colovaginal fistula. Chronic constipation. EXAM: CT ABDOMEN AND PELVIS WITH CONTRAST TECHNIQUE: Multidetector CT imaging of the abdomen and pelvis was performed using the standard protocol following bolus administration of intravenous contrast. RADIATION DOSE REDUCTION: This exam was performed according to the departmental dose-optimization program which includes automated exposure control, adjustment of the mA and/or kV according to patient size and/or use of iterative reconstruction technique. CONTRAST:  OMNIPAQUE IOHEXOL 300 MG/ML  SOLN COMPARISON:  04/09/2023 FINDINGS: Lower Chest: No acute findings. Hepatobiliary: No suspicious hepatic  masses identified. Prior cholecystectomy. No evidence of biliary obstruction. Pancreas:  No mass or inflammatory changes. Spleen: Within normal limits in size and appearance.  Adrenals/Urinary Tract: No suspicious masses identified. No evidence of ureteral calculi or hydronephrosis. Stomach/Bowel: Moderate sigmoid diverticulitis has increased since previous study. New focal inflammatory collection with internal gas bubbles seen in the central sigmoid mesocolon measuring 4.1 x 3.0 cm, consistent with localized perforation. No No evidence of free intraperitoneal air. A new rim enhancing fluid collection is seen in the right perirectal region measuring 5.6 x 4.0 cm, consistent with abscess. A new rim enhancing fluid collection is also seen in the anterior left lower quadrant which measures 6.0 x 4.1 cm, also consistent with abscess. A small amount of gas is again seen in the vagina, suspicious for colovaginal fistula. Increased mild reactive wall thickening of the adjacent urinary bladder also seen. Vascular/Lymphatic: No pathologically enlarged lymph nodes. No acute vascular findings. Reproductive:  Prior hysterectomy. Other:  None. Musculoskeletal:  No suspicious bone lesions identified. IMPRESSION: Worsening sigmoid diverticulitis, with new 4.1 cm focal inflammatory collection with gas bubbles in the central sigmoid mesocolon, consistent with localized perforation. New 5.6 cm right perirectal abscess. New 6.0 cm left lower quadrant abscess. Persistent small amount of gas in the vagina, suspicious for colovaginal fistula. Electronically Signed   By: Danae Orleans M.D.   On: 04/22/2023 16:43    Assessment/Plan Present on Admission:  Worsening diverticulitis w/ new localized perf and perirectal abscess -N.p.o., IV fluid hydration -Blood cultures x 2 -Plans to treat with IV antibiotics.  Continue Zosyn IV.  Patient high surgical risk.  Surgery consult placed.  Surgeon Dr. Mitzi Davenport has seen patient.  Of note patient  with pulmonary clearance 04/07/2023 from Dr Cyril Mourning and cardiology clearance 11/11 Tonye Becket NP.  Likely outpatient surgical surgical intervention. -Pain medications as needed -Lactic acid level normal at 0.8. -Tylenol as needed for defervescent's -Surgery consult placed.  Surgeon is seeing patient.  Likely outpatient -High malnutrition risk. Nutrition consult place   Persistent atrial fibrillation (HCC) -Xarelto resumed.  No surgical intervention planned. -Maintained on Tikosyn.  QTc 410.  Maintain potassium 4, magnesium 2.  Magnesium level ordered.   COPD (chronic obstructive pulmonary disease) (HCC)  Chronic respiratory failure 5L  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HCC) -Continue oxygen, nebulizers as needed   Chronic systolic heart failure (HCC) -Did not tolerate Entresto, BBlocker due to low blood pressure -Statin intolerant  Chronic gout -On Uloric continued   OSA -BiPAP.  Patient to wear her home   Type 2 diabetes mellitus with diabetic neuropathy, without long-term current use of insulin (HCC) -Sliding scale insulin.   Colovaginal fistula  Constipation  Sintia Mckissic 04/22/2023, 9:28 PM

## 2023-04-22 NOTE — ED Provider Notes (Signed)
[place] EMERGENCY DEPARTMENT AT [facility] Provider Note   CSN: [id number] Arrival date & time: 04/22/23  1819     History  Chief Complaint  Patient presents with   Abdominal Pain    Kathleen [name] is a 77 y.o. 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  Patient states that she has been having left lower quadrant pain for quite some time and went to her GI specialist at [facility] and had a CT scan done that shows these concerning features.  Patient states that she is having bowel movements as normal but does note black stool.  Patient was taking her Xarelto without missing a dose.  Patient states the last night she had a few seconds worth of chest pain but got better after drinking water.  Chest pain did not radiate and did not have any shortness of breath.  Patient does not feel fluid overloaded but does have discomfort from her abdomen as she states it feels more distended than normal.  Patient denies any urinary symptoms, nausea/vomiting, fevers.  Home Medications Prior to Admission medications   Medication Sig Start Date End Date Taking? Authorizing Provider  acetaminophen (TYLENOL) 650 MG CR tablet Take 1,300 mg by mouth every 8 (eight) hours as needed for pain.    [provider]  amoxicillin-clavulanate (AUGMENTIN) 875-125 MG tablet Take 1 tablet by mouth every 12 (twelve) hours. 04/13/23   Danford, Earl Lites, MD  azelastine (ASTELIN) 0.1 % nasal spray Place 2 sprays into both nostrils 2 (two) times daily. 11/05/22   Oretha Milch, MD  Cholecalciferol (VITAMIN D3) 50 MCG (2000 UT) TABS Take 2,000 Units by mouth at bedtime.    [provider]  cyanocobalamin (VITAMIN B12) 1000 MCG/ML injection Inject 1 mL (1,000 mcg total) into the muscle every 30 (thirty) days. 05/05/22   [name], MD  docusate sodium  (COLACE) 100 MG capsule Take 100 mg by mouth 2 (two) times daily.    [provider]  dofetilide (TIKOSYN) 250 MCG capsule Take 1 capsule (250 mcg total) by mouth in the morning and at bedtime. 12/01/22 12/01/23  Fenton, Clint R, PA  febuxostat (ULORIC) 40 MG tablet TAKE 1 TABLET(40 MG) BY MOUTH DAILY 04/05/23   Rice, Jamesetta Orleans, MD  fluticasone furoate-vilanterol (BREO ELLIPTA) 100-25 MCG/ACT AEPB INHALE 1 PUFF INTO THE LUNGS DAILY 03/23/23   Oretha Milch, MD  gabapentin (NEURONTIN) 100 MG capsule Take 2 capsules (200 mg total) by mouth at bedtime. 11/03/22 06/01/23  Windell Norfolk, MD  levalbuterol (XOPENEX) 0.63 MG/3ML nebulizer solution Take 3 mLs (0.63 mg total) by nebulization every 6 (six) hours as needed for wheezing or shortness of breath. DX J44.89 J96.21 01/20/23   Oretha Milch, MD  Magnesium 500 MG TABS Take 500 mg by mouth at bedtime.    [provider]  nystatin (MYCOSTATIN) 100000 UNIT/ML suspension Take 5 mLs (500,000 Units total) by mouth 4 (four) times daily. 04/04/23   Almon Hercules, MD  OXYGEN Inhale 5 L into the lungs continuous. Use with resmed ventilator    [provider]  pantoprazole (PROTONIX) 40 MG tablet Take 1 tablet (40 mg total) by mouth daily. 04/05/23   Almon Hercules, MD  Plecanatide (TRULANCE) 3 MG TABS Take 1 tablet (3 mg total) by mouth daily. Patient not taking: Reported on 04/09/2023 04/06/23   Mansouraty, Netty Starring.,  MD  potassium chloride SA (KLOR-CON M) 20 MEQ tablet Take 2 tablets (40 mEq total) by mouth daily. 04/14/23   Danford, Earl Lites, MD  revefenacin (YUPELRI) 175 MCG/3ML nebulizer solution Take 175 mcg by nebulization daily.    [provider]  rivaroxaban (XARELTO) 20 MG TABS tablet Take 1 tablet (20 mg total) by mouth daily with supper. 02/01/23   Bensimhon, [name], MD  spironolactone (ALDACTONE) 25 MG tablet Take 1 tablet (25 mg total) by mouth 2 (two) times daily. 04/13/23   Danford, Earl Lites, MD   torsemide (DEMADEX) 20 MG tablet Take 2 tablets (40 mg total) by mouth daily. 04/14/23   DanfordEarl Lites, MD      Allergies    Allopurinol, Clindamycin, Flublok [influenza vaccine recombinant], Pneumococcal 13-val conj vacc, Dronedarone, Montelukast, Brovana [arformoterol], Budesonide, Entresto [sacubitril-valsartan], Fosamax [alendronate sodium], Jardiance [empagliflozin], Meperidine, Microplegia msa-msg [plegisol], Rosuvastatin, Tetracycline, Adhesive [tape], and Lovastatin    Review of Systems   Review of Systems  Gastrointestinal:  Positive for abdominal pain.    Physical Exam Updated Vital Signs BP (!) 125/56   Pulse 92   Temp (!) 101.8 F (38.8 C) (Oral)   Resp 19   Ht 5' (1.524 m)   Wt 79.4 kg   SpO2 100%   BMI 34.18 kg/m  Physical Exam Vitals reviewed.  Constitutional:      General: She is not in acute distress.    Comments: Febrile  HENT:     Head: Normocephalic and atraumatic.  Eyes:     Extraocular Movements: Extraocular movements intact.     Conjunctiva/sclera: Conjunctivae normal.     Pupils: Pupils are equal, round, and reactive to light.  Cardiovascular:     Rate and Rhythm: Normal rate and regular rhythm.     Pulses: Normal pulses.     Heart sounds: Normal heart sounds.     Comments: 2+ bilateral radial/dorsalis pedis pulses with regular rate Pulmonary:     Effort: Pulmonary effort is normal. No respiratory distress.     Breath sounds: Normal breath sounds.  Abdominal:     Palpations: Abdomen is soft.     Tenderness: There is generalized abdominal tenderness and tenderness in the left lower quadrant. There is no guarding or rebound.  Musculoskeletal:        General: Normal range of motion.     Cervical back: Normal range of motion and neck supple.     Comments: 5 out of 5 bilateral grip/leg extension strength  Skin:    General: Skin is warm and dry.     Capillary Refill: Capillary refill takes less than 2 seconds.  Neurological:      General: No focal deficit present.     Mental Status: She is alert and oriented to person, place, and time.     Comments: Sensation intact in all 4 limbs  Psychiatric:        Mood and Affect: Mood normal.     ED Results / Procedures / Treatments   Labs (all labs ordered are listed, but only abnormal results are displayed) Labs Reviewed  COMPREHENSIVE METABOLIC PANEL - Abnormal; Notable for the following components:      Result Value   Sodium 129 (*)    Chloride 90 (*)    Glucose, Bld 162 (*)    Creatinine, Ser 1.11 (*)    Total Protein 6.4 (*)    Albumin 2.9 (*)    Total Bilirubin 1.2 (*)    GFR, Estimated 52 (*)  All other components within normal limits  CBC WITH DIFFERENTIAL/PLATELET - Abnormal; Notable for the following components:   WBC 11.9 (*)    Hemoglobin 11.6 (*)    Neutro Abs 9.9 (*)    Abs Immature Granulocytes 0.13 (*)    All other components within normal limits  PROTIME-INR - Abnormal; Notable for the following components:   Prothrombin Time 20.0 (*)    INR 1.7 (*)    All other components within normal limits  APTT - Abnormal; Notable for the following components:   aPTT 42 (*)    All other components within normal limits  RESP PANEL BY RT-PCR (RSV, FLU A&B, COVID)  RVPGX2  CULTURE, BLOOD (ROUTINE X 2)  CULTURE, BLOOD (ROUTINE X 2)  LIPASE, BLOOD  BRAIN NATRIURETIC PEPTIDE  URINALYSIS, W/ REFLEX TO CULTURE (INFECTION SUSPECTED)  I-STAT CG4 LACTIC ACID, ED  I-STAT CG4 LACTIC ACID, ED  TROPONIN I (HIGH SENSITIVITY)  TROPONIN I (HIGH SENSITIVITY)    EKG None  Radiology CT ABDOMEN PELVIS W CONTRAST  Result Date: 04/22/2023 CLINICAL DATA:  Sigmoid diverticulitis. Colovaginal fistula. Chronic constipation. EXAM: CT ABDOMEN AND PELVIS WITH CONTRAST TECHNIQUE: Multidetector CT imaging of the abdomen and pelvis was performed using the standard protocol following bolus administration of intravenous contrast. RADIATION DOSE REDUCTION: This exam was  performed according to the departmental dose-optimization program which includes automated exposure control, adjustment of the mA and/or kV according to patient size and/or use of iterative reconstruction technique. CONTRAST:  OMNIPAQUE IOHEXOL 300 MG/ML  SOLN COMPARISON:  04/09/2023 FINDINGS: Lower Chest: No acute findings. Hepatobiliary: No suspicious hepatic masses identified. Prior cholecystectomy. No evidence of biliary obstruction. Pancreas:  No mass or inflammatory changes. Spleen: Within normal limits in size and appearance. Adrenals/Urinary Tract: No suspicious masses identified. No evidence of ureteral calculi or hydronephrosis. Stomach/Bowel: Moderate sigmoid diverticulitis has increased since previous study. New focal inflammatory collection with internal gas bubbles seen in the central sigmoid mesocolon measuring 4.1 x 3.0 cm, consistent with localized perforation. No No evidence of free intraperitoneal air. A new rim enhancing fluid collection is seen in the right perirectal region measuring 5.6 x 4.0 cm, consistent with abscess. A new rim enhancing fluid collection is also seen in the anterior left lower quadrant which measures 6.0 x 4.1 cm, also consistent with abscess. A small amount of gas is again seen in the vagina, suspicious for colovaginal fistula. Increased mild reactive wall thickening of the adjacent urinary bladder also seen. Vascular/Lymphatic: No pathologically enlarged lymph nodes. No acute vascular findings. Reproductive:  Prior hysterectomy. Other:  None. Musculoskeletal:  No suspicious bone lesions identified. IMPRESSION: Worsening sigmoid diverticulitis, with new 4.1 cm focal inflammatory collection with gas bubbles in the central sigmoid mesocolon, consistent with localized perforation. New 5.6 cm right perirectal abscess. New 6.0 cm left lower quadrant abscess. Persistent small amount of gas in the vagina, suspicious for colovaginal fistula. Electronically Signed   By: Danae Orleans M.D.   On: 04/22/2023 16:43    Procedures .Critical Care  Performed by: Netta Corrigan, PA-C Authorized by: Netta Corrigan, PA-C   Critical care provider statement:    Critical care time (minutes):  40   Critical care time was exclusive of:  Separately billable procedures and treating other patients   Critical care was necessary to treat or prevent imminent or life-threatening deterioration of the following conditions:  Sepsis   Critical care was time spent personally by me on the following activities:  Blood draw for specimens,  development of treatment plan with patient or surrogate, discussions with consultants, evaluation of patient's response to treatment, re-evaluation of patient's condition, pulse oximetry, ordering and review of radiographic studies, ordering and review of laboratory studies, ordering and performing treatments and interventions, review of old charts, examination of patient and obtaining history from patient or surrogate   I assumed direction of critical care for this patient from another provider in my specialty: no     Care discussed with: admitting provider       Medications Ordered in ED Medications  acetaminophen (TYLENOL) tablet 650 mg (0 mg Oral Hold 04/22/23 1918)  morphine (PF) 4 MG/ML injection 4 mg (4 mg Intravenous Given 04/22/23 1930)  piperacillin-tazobactam (ZOSYN) IVPB 3.375 g (0 g Intravenous Stopped 04/22/23 1954)  ondansetron (ZOFRAN) injection 4 mg (4 mg Intravenous Given 04/22/23 1927)    ED Course/ Medical Decision Making/ A&P                                 Medical Decision Making Amount and/or Complexity of Data Reviewed Labs: ordered. Radiology: ordered. ECG/medicine tests: ordered.  Risk OTC drugs. Prescription drug management. Decision regarding hospitalization.   Kathleen [name] 77 y.o. presented today for sepsis.  Working DDx that I considered at this time includes, but not limited to, sepsis, bowel perforation,  intra-abdominal abscess, bacteremia, UTI, pneumonia, meningitis/encephalitis, cellulitis, ACS, myocarditis, acidosis, dehydration, electrolyte abnormalities.  R/o DDx: bacteremia, UTI, pneumonia, meningitis/encephalitis, cellulitis, ACS, myocarditis, acidosis, dehydration, electrolyte abnormalities, ACS, heart failure: These are considered less likely due to history of present illness, physical exam, labs/imaging findings.  Review of prior external notes: 11th of 1024 progress Notes  Unique Tests and My Interpretation: CBC: Leukocytosis 11.9 CMP: Mild hyponatremia 129 Lactic acid: 1.8 Lipase: Unremarkable UA: Pending Chest x-ray: No acute findings aPTT: 42, around baseline PT/INR: 20/1.7 Blood cultures: Pending Respiratory panel: Pending EKG: Sinus of 7 bpm, left bundle branch block noted, no signs of Sgarbossa criteria BNP: 82.7 Troponin: 11  Social Determinants of Health: none  Discussion with Independent Historian:  Son  Discussion of Management of Tests:  Freida Busman, MD General Surgery; Joneen Roach, MD Hospitalist  Risk: High: Hospitalization  Risk Stratification Score: none  Staffed with Eloise Harman, MD  Plan: On exam patient was septic on arrival.  On exam patient in no acute distress but febrile 101.8 F.  Patient did have generalized tenderness on exam with some distention but was notably more tender in the left lower quadrant.  Outpatient CT scan does show sigmoid diverticulitis with perforation and abscesses.  Patient is currently on her Xarelto and has not missed any doses.  Patient did endorse some vague chest pain last night that resolved with water and only last for few moments but will obtain cardiac workup given patient's cardiac history.  Will consult general surgery as I do anticipate patient being admitted.  Will start patient on IV Zosyn in the meantime.  I spoke to the general surgeon on-call and she states that patient's GI specialist contacted her and that she will  see the patient in the morning but that patient go to medicine.  Hospitalist will be consulted.  I spoke to the hospitalist and patient was accepted for admission.  Patient stable for admission.  This chart was dictated using voice recognition software.  Despite best efforts to proofread,  errors can occur which can change the documentation meaning.         Final  Clinical Impression(s) / ED Diagnoses Final diagnoses:  Sepsis, due to unspecified organism, unspecified whether acute organ dysfunction present [facility])    Rx / DC Orders ED Discharge Orders     None         Remi Deter 04/22/23 2125    Rondel Baton, MD 04/24/23 1110

## 2023-04-22 NOTE — Sepsis Progress Note (Addendum)
Elink monitoring for the code sepsis protocol.  Notified bedside nurse of need to administer antibiotics.

## 2023-04-22 NOTE — Progress Notes (Signed)
Pharmacy Antibiotic Note  Kathleen [name] is a 77 y.o. female admitted on 04/22/2023 with intra-abdominal infection. CT ab/pelv with diverticulitis with concern for perforation and multiple abscess. Pharmacy has been consulted for zosyn dosing.  Plan: Zosyn 3.375g q8h  F/u renal function, infectious work up and narrow as able  Height: 5' (152.4 cm) Weight: 79.4 kg (175 lb) IBW/kg (Calculated) : 45.5  Temp (24hrs), Avg:101.8 F (38.8 C), Min:101.8 F (38.8 C), Max:101.8 F (38.8 C)  Recent Labs  Lab 04/19/23 1534 04/22/23 1840 04/22/23 1905  WBC  --  11.9*  --   CREATININE 0.81 1.11*  --   LATICACIDVEN  --   --  1.8    Estimated Creatinine Clearance: 40.2 mL/min (A) (by C-G formula based on SCr of 1.11 mg/dL (H)).    Allergies  Allergen Reactions   Allopurinol Other (See Comments)    Reaction:  Dizziness    Clindamycin Anaphylaxis and Hives   Flublok [Influenza Vaccine Recombinant] Other (See Comments)    Fever 103 with no alternative explanation day after vaccine.  Clydie Braun Highfill FNP-C   Pneumococcal 13-Val Conj Vacc Itching, Swelling and Rash   Dronedarone Rash   Montelukast Other (See Comments)    Makes her loopy.   Brovana [Arformoterol]     Caused muscle pain   Budesonide     Caused extreme joint pain   Entresto [Sacubitril-Valsartan] Other (See Comments)    hypotension   Fosamax [Alendronate Sodium] Nausea Only   Jardiance [Empagliflozin]     Caused a vaginal infection   Meperidine Nausea And Vomiting   Microplegia Msa-Msg [Plegisol]     Loopy,diarrhea   Rosuvastatin Other (See Comments)    Reaction:  Muscle spasms    Tetracycline Hives   Adhesive [Tape] Rash   Lovastatin Rash and Other (See Comments)    Muscle Pain    Antimicrobials this admission: Zosyn 1/14 >  Microbiology results: 11/14 BCx: pending  Thank you for allowing pharmacy to be a part of this patient's care.  Marja Kays 04/22/2023 10:11 PM

## 2023-04-22 NOTE — ED Notes (Signed)
ED TO INPATIENT HANDOFF REPORT  ED Nurse Name and Phone #: Grover Canavan [phone]  S Name/Age/Gender Kathleen [name] 77 y.o. female Room/Bed: 010C/010C  Code Status   Code Status: Prior  Home/SNF/Other Home Patient oriented to: self, place, time, and situation Is this baseline? Yes   Triage Complete: Triage complete  Chief Complaint Diverticulitis of colon with perforation [K57.20]  Triage Note Pt BIB from home, pt got a call from doctor she had a perforated bowel. Axox4. Dark stools since yesterday. Last BM today .    Allergies Allergies  Allergen Reactions   Allopurinol Other (See Comments)    Reaction:  Dizziness    Clindamycin Anaphylaxis and Hives   Flublok [Influenza Vaccine Recombinant] Other (See Comments)    Fever 103 with no alternative explanation day after vaccine.  Clydie Braun Highfill FNP-C   Pneumococcal 13-Val Conj Vacc Itching, Swelling and Rash   Dronedarone Rash   Montelukast Other (See Comments)    Makes her loopy.   Brovana [Arformoterol]     Caused muscle pain   Budesonide     Caused extreme joint pain   Entresto [Sacubitril-Valsartan] Other (See Comments)    hypotension   Fosamax [Alendronate Sodium] Nausea Only   Jardiance [Empagliflozin]     Caused a vaginal infection   Meperidine Nausea And Vomiting   Microplegia Msa-Msg [Plegisol]     Loopy,diarrhea   Rosuvastatin Other (See Comments)    Reaction:  Muscle spasms    Tetracycline Hives   Adhesive [Tape] Rash   Lovastatin Rash and Other (See Comments)    Muscle Pain    Level of Care/Admitting Diagnosis ED Disposition     ED Disposition  Admit   Condition  --   Comment  Hospital Area: [facility] [[id number]]  Level of Care: Med-Surg [16]  May admit patient to [facility] or [facility] if equivalent level of care is available:: No  Covid Evaluation: Asymptomatic - no recent exposure (last 10 days) testing not required  Diagnosis: Diverticulitis of colon with perforation  [962952]  Admitting Physician: Gery Pray [[id number]]  Attending Physician: Gery Pray [[id number]]  Certification:: I certify this patient will need inpatient services for at least 2 midnights  Expected Medical Readiness: 04/24/2023          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    Concussion 10/04/2021   COPD (chronic obstructive pulmonary disease) (HCC)    Diverticulitis    Gout    Hypertension    Longstanding persistent atrial fibrillation (HCC)    on Xarelto   Nonischemic cardiomyopathy (HCC)    Obesity    Pacemaker    Spontaneous pneumothorax 2013   Past Surgical History:  Procedure Laterality Date   ABDOMINAL HYSTERECTOMY     APPENDECTOMY     ATRIAL FIBRILLATION ABLATION  07/20/2013   by Dr Christin Fudge   AV nodal ablation  11/01/2013   by Dr Christin Fudge, repeated by Dr Wilford Grist   BREAST BIOPSY Bilateral 1997   negative   CARDIAC CATHETERIZATION     CHOLECYSTECTOMY     HERNIA REPAIR     PACEMAKER INSERTION  06/2017   MDT Viva CRT-P implanted by Dr Christin Fudge after AV nodal ablation,  LV lead could not be placed   PACEMAKER  INSERTION  05/2020   with lead bundle   RIGHT/LEFT HEART CATH AND CORONARY ANGIOGRAPHY N/A 03/20/2022   Procedure: RIGHT/LEFT HEART CATH AND CORONARY ANGIOGRAPHY;  Surgeon: Swaziland, Peter M, MD;  Location: [facility] INVASIVE CV LAB;  Service: Cardiovascular;  Laterality: N/A;     A IV Location/Drains/Wounds Patient Lines/Drains/Airways Status     Active Line/Drains/Airways     Name Placement date Placement time Site Days   Peripheral IV 04/22/23 20 G Right Antecubital 04/22/23  1907  Antecubital  less than 1   Peripheral IV 04/22/23 22 G Anterior;Left;Proximal Forearm 04/22/23  1907  Forearm  less than 1             Intake/Output Last 24 hours No intake or output data in the 24 hours ending 04/22/23 2215  Labs/Imaging Results for orders placed or performed during the hospital encounter of 04/22/23 (from the past 48 hour(s))  Resp panel by RT-PCR (RSV, Flu A&B, Covid) Anterior Nasal Swab     Status: None   Collection Time: 04/22/23  6:38 PM   Specimen: Anterior Nasal Swab  Result Value Ref Range   SARS Coronavirus 2 by RT PCR NEGATIVE NEGATIVE   Influenza A by PCR NEGATIVE NEGATIVE   Influenza B by PCR NEGATIVE NEGATIVE    Comment: (NOTE) The Xpert Xpress SARS-CoV-2/FLU/RSV plus assay is intended as an aid in the diagnosis of influenza from Nasopharyngeal swab specimens and should not be used as a sole basis for treatment. Nasal washings and aspirates are unacceptable for Xpert Xpress SARS-CoV-2/FLU/RSV testing.  Fact Sheet for Patients: BloggerCourse.com  Fact Sheet for Healthcare Providers: SeriousBroker.it  This test is not yet approved or cleared by the [place] FDA and has been authorized for detection and/or diagnosis of SARS-CoV-2 by FDA under an Emergency Use Authorization (EUA). This EUA will remain in effect (meaning this test can be used) for the duration of the COVID-19 declaration under Section 564(b)(1) of the Act, 21 U.S.C. section 360bbb-3(b)(1), unless the authorization is terminated or revoked.     Resp Syncytial Virus by PCR NEGATIVE NEGATIVE    Comment: (NOTE) Fact Sheet for Patients: BloggerCourse.com  Fact Sheet for Healthcare Providers: SeriousBroker.it  This test is not yet approved or cleared by the [place] FDA and has been authorized for detection and/or diagnosis of SARS-CoV-2 by FDA under an Emergency Use Authorization (EUA). This EUA will remain in effect (meaning this test can be used) for the duration of the COVID-19 declaration under  Section 564(b)(1) of the Act, 21 U.S.C. section 360bbb-3(b)(1), unless the authorization is terminated or revoked.  Performed at [facility], [address], [place], [place] [address]   Comprehensive metabolic panel     Status: Abnormal   Collection Time: 04/22/23  6:40 PM  Result Value Ref Range   Sodium 129 (L) 135 - 145 mmol/L   Potassium 3.8 3.5 - 5.1 mmol/L   Chloride 90 (L) 98 - 111 mmol/L   CO2 26 22 - 32 mmol/L   Glucose, Bld 162 (H) 70 - 99 mg/dL    Comment: Glucose reference range applies only to samples taken after fasting for at least 8 hours.   BUN 19 8 - 23 mg/dL   Creatinine, Ser 4.69 (H) 0.44 - 1.00 mg/dL   Calcium 9.0 8.9 - 62.9 mg/dL   Total Protein 6.4 (L) 6.5 - 8.1 g/dL   Albumin 2.9 (L) 3.5 - 5.0 g/dL   AST 28 15 - 41 U/L   ALT  21 0 - 44 U/L   Alkaline Phosphatase 57 38 - 126 U/L   Total Bilirubin 1.2 (H) <1.2 mg/dL   GFR, Estimated 52 (L) >60 mL/min    Comment: (NOTE) Calculated using the CKD-EPI Creatinine Equation (2021)    Anion gap 13 5 - 15    Comment: Performed at [facility], [address] [place], [place] [address]  CBC with Differential     Status: Abnormal   Collection Time: 04/22/23  6:40 PM  Result Value Ref Range   WBC 11.9 (H) 4.0 - 10.5 K/uL   RBC 4.45 3.87 - 5.11 MIL/uL   Hemoglobin 11.6 (L) 12.0 - 15.0 g/dL   HCT 36.6 44.0 - 34.7 %   MCV 81.3 80.0 - 100.0 fL   MCH 26.1 26.0 - 34.0 pg   MCHC 32.0 30.0 - 36.0 g/dL   RDW 42.5 95.6 - 38.7 %   Platelets 246 150 - 400 K/uL   nRBC 0.0 0.0 - 0.2 %   Neutrophils Relative % 83 %   Neutro Abs 9.9 (H) 1.7 - 7.7 K/uL   Lymphocytes Relative 7 %   Lymphs Abs 0.8 0.7 - 4.0 K/uL   Monocytes Relative 8 %   Monocytes Absolute 0.9 0.1 - 1.0 K/uL   Eosinophils Relative 1 %   Eosinophils Absolute 0.1 0.0 - 0.5 K/uL   Basophils Relative 0 %   Basophils Absolute 0.0 0.0 - 0.1 K/uL   Immature Granulocytes 1 %   Abs Immature Granulocytes 0.13 (H) 0.00 - 0.07 K/uL    Comment:  Performed at [facility], [address] [place], [place] [address]  Protime-INR     Status: Abnormal   Collection Time: 04/22/23  6:40 PM  Result Value Ref Range   Prothrombin Time 20.0 (H) 11.4 - 15.2 seconds   INR 1.7 (H) 0.8 - 1.2    Comment: (NOTE) INR goal varies based on device and disease states. Performed at [facility], [address] [place], [place] [address]   APTT     Status: Abnormal   Collection Time: 04/22/23  6:40 PM  Result Value Ref Range   aPTT 42 (H) 24 - 36 seconds    Comment:        IF BASELINE aPTT IS ELEVATED, SUGGEST PATIENT RISK ASSESSMENT BE USED TO DETERMINE APPROPRIATE ANTICOAGULANT THERAPY. Performed at [facility], [address], [place], [place] [address]   Troponin I (High Sensitivity)     Status: None   Collection Time: 04/22/23  6:40 PM  Result Value Ref Range   Troponin I (High Sensitivity) 11 <18 ng/L    Comment: (NOTE) Elevated high sensitivity troponin I (hsTnI) values and significant  changes across serial measurements may suggest ACS but many other  chronic and acute conditions are known to elevate hsTnI results.  Refer to the "Links" section for chest pain algorithms and additional  guidance. Performed at [facility], [address] [place], [place] [address]   Lipase, blood     Status: None   Collection Time: 04/22/23  6:40 PM  Result Value Ref Range   Lipase 30 11 - 51 U/L    Comment: Performed at [facility], [address], [place], [place] [address]  Brain natriuretic peptide     Status: None   Collection Time: 04/22/23  6:40 PM  Result Value Ref Range   B Natriuretic Peptide 82.7 0.0 - 100.0 pg/mL  Comment: Performed at [facility], [address] [place], [place] [address]  I-Stat Lactic Acid, ED     Status: None   Collection Time: 04/22/23  7:05 PM  Result Value Ref Range   Lactic Acid, Venous 1.8 0.5 - 1.9 mmol/L  CBG monitoring, ED     Status: Abnormal   Collection  Time: 04/22/23  9:44 PM  Result Value Ref Range   Glucose-Capillary 136 (H) 70 - 99 mg/dL    Comment: Glucose reference range applies only to samples taken after fasting for at least 8 hours.   DG Chest Port 1 View  Result Date: 04/22/2023 CLINICAL DATA:  Dark stool, bowel perforation EXAM: PORTABLE CHEST 1 VIEW COMPARISON:  CT 04/22/2023,, chest x-ray 04/09/2023 FINDINGS: Left-sided multi lead pacing device as before. Enlarged cardiomediastinal silhouette with vascular congestion. Aortic atherosclerosis. Minimal atelectasis or scarring at left base. No pleural effusion. IMPRESSION: Enlarged cardiomediastinal silhouette with mild vascular congestion. Electronically Signed   By: Jasmine Pang M.D.   On: 04/22/2023 22:01   CT ABDOMEN PELVIS W CONTRAST  Result Date: 04/22/2023 CLINICAL DATA:  Sigmoid diverticulitis. Colovaginal fistula. Chronic constipation. EXAM: CT ABDOMEN AND PELVIS WITH CONTRAST TECHNIQUE: Multidetector CT imaging of the abdomen and pelvis was performed using the standard protocol following bolus administration of intravenous contrast. RADIATION DOSE REDUCTION: This exam was performed according to the departmental dose-optimization program which includes automated exposure control, adjustment of the mA and/or kV according to patient size and/or use of iterative reconstruction technique. CONTRAST:  OMNIPAQUE IOHEXOL 300 MG/ML  SOLN COMPARISON:  04/09/2023 FINDINGS: Lower Chest: No acute findings. Hepatobiliary: No suspicious hepatic masses identified. Prior cholecystectomy. No evidence of biliary obstruction. Pancreas:  No mass or inflammatory changes. Spleen: Within normal limits in size and appearance. Adrenals/Urinary Tract: No suspicious masses identified. No evidence of ureteral calculi or hydronephrosis. Stomach/Bowel: Moderate sigmoid diverticulitis has increased since previous study. New focal inflammatory collection with internal gas bubbles seen in the central sigmoid  mesocolon measuring 4.1 x 3.0 cm, consistent with localized perforation. No No evidence of free intraperitoneal air. A new rim enhancing fluid collection is seen in the right perirectal region measuring 5.6 x 4.0 cm, consistent with abscess. A new rim enhancing fluid collection is also seen in the anterior left lower quadrant which measures 6.0 x 4.1 cm, also consistent with abscess. A small amount of gas is again seen in the vagina, suspicious for colovaginal fistula. Increased mild reactive wall thickening of the adjacent urinary bladder also seen. Vascular/Lymphatic: No pathologically enlarged lymph nodes. No acute vascular findings. Reproductive:  Prior hysterectomy. Other:  None. Musculoskeletal:  No suspicious bone lesions identified. IMPRESSION: Worsening sigmoid diverticulitis, with new 4.1 cm focal inflammatory collection with gas bubbles in the central sigmoid mesocolon, consistent with localized perforation. New 5.6 cm right perirectal abscess. New 6.0 cm left lower quadrant abscess. Persistent small amount of gas in the vagina, suspicious for colovaginal fistula. Electronically Signed   By: Danae Orleans M.D.   On: 04/22/2023 16:43    Pending Labs Unresulted Labs (From admission, onward)     Start     Ordered   04/22/23 2134  Lactic acid, plasma  (Lactic Acid)  STAT Now then every 3 hours,   R (with STAT occurrences)      04/22/23 2133   04/22/23 2126  Magnesium  Add-on,   AD        04/22/23 2125   04/22/23 1838  Blood Culture (routine x 2)  (  Septic presentation on arrival (screening labs, nursing and treatment orders for obvious sepsis))  BLOOD CULTURE X 2,   STAT      04/22/23 1839   04/22/23 1838  Urinalysis, w/ Reflex to Culture (Infection Suspected) -Urine, Clean Catch  (Septic presentation on arrival (screening labs, nursing and treatment orders for obvious sepsis))  ONCE - URGENT,   URGENT       Question:  Specimen Source  Answer:  Urine, Clean Catch   04/22/23 1839             Vitals/Pain Today's Vitals   04/22/23 1945 04/22/23 2000 04/22/23 2100 04/22/23 2134  BP: (!) 125/56 (!) 121/99 108/73   Pulse: 92 78 72   Resp: 19 14 16    Temp:      TempSrc:      SpO2: 100% 100% 100%   Weight:      Height:      PainSc:    6     Isolation Precautions No active isolations  Medications Medications  acetaminophen (TYLENOL) tablet 650 mg (0 mg Oral Hold 04/22/23 1918)  0.9 % NaCl with KCl 20 mEq/ L  infusion (has no administration in time range)  ondansetron (ZOFRAN) injection 4 mg (has no administration in time range)  promethazine (PHENERGAN) 12.5 mg in sodium chloride 0.9 % 50 mL IVPB (has no administration in time range)  sodium chloride 0.9 % bolus 500 mL (0 mLs Intravenous Hold 04/22/23 2214)  HYDROmorphone (DILAUDID) injection 0.5 mg (has no administration in time range)  oxyCODONE (Oxy IR/ROXICODONE) immediate release tablet 5 mg (has no administration in time range)  dofetilide (TIKOSYN) capsule 250 mcg (has no administration in time range)  fluticasone furoate-vilanterol (BREO ELLIPTA) 100-25 MCG/ACT 1 puff (has no administration in time range)  gabapentin (NEURONTIN) capsule 200 mg (has no administration in time range)  pantoprazole (PROTONIX) EC tablet 40 mg (has no administration in time range)  potassium chloride SA (KLOR-CON M) CR tablet 40 mEq (has no administration in time range)  revefenacin (YUPELRI) nebulizer solution 175 mcg (has no administration in time range)  morphine (PF) 4 MG/ML injection 4 mg (4 mg Intravenous Given 04/22/23 1930)  piperacillin-tazobactam (ZOSYN) IVPB 3.375 g (0 g Intravenous Stopped 04/22/23 1954)  ondansetron (ZOFRAN) injection 4 mg (4 mg Intravenous Given 04/22/23 1927)    Mobility walks with person assist     Focused Assessments Gastrointestinal    R Recommendations: See Admitting Provider Note  Report given to:   Additional Notes:

## 2023-04-22 NOTE — Telephone Encounter (Signed)
I reviewed my results and saw that there was a read on the patient's CT scan. She has now developed complicated diverticulitis with perforation and fluid collections. I called her but was only able to leave a voicemail. I reached out to the patient's son who is accompanying her to clinic visits and he is now aware of these results He said that she felt a little bit worse in the last couple of days.,  But had not done anything more than rest. This CT was from earlier today. He is heading to her home and will evaluate her and ring her to the hospital urgently versus calling 911 if she is doing very poorly. With her multiple medical comorbidities, in case she were to need surgical interventions, I think it is best for her to be at [facility]. They are going to bring her to [facility]. I called the charge nurse for the ED to update them. I also briefly spoke with on-call general surgery, in case any surgical interventions are required. If she is stable, I would suspect she will need drains placed by interventional radiology unless she progresses or worsens. I am putting my overnight call team on here so that they are aware and putting her on for the GI inpatient team to evaluate and follow-up tomorrow in the hospital. Patient's son appreciates the call and we will move forward with finding out how his mom is doing and getting her to the hospital.   Corliss Parish, MD [facility] Advanced Endoscopy Office # [phone]

## 2023-04-22 NOTE — Hospital Course (Addendum)
or cardiac arrest.

## 2023-04-22 NOTE — Telephone Encounter (Signed)
Spoke with [facility] ([facility]) regarding home health nursing orders and OT orders. Per De[facility] this can be given via Verbal order.   Advised that I would forward message over to Dr. Gala Romney regarding this.

## 2023-04-22 NOTE — ED Triage Notes (Addendum)
Pt BIB from home, pt got a call from doctor she had a perforated bowel. Axox4. Dark stools since yesterday. Last BM today .

## 2023-04-22 NOTE — Patient Outreach (Signed)
  Care Coordination   04/22/2023 Name: Kathleen [name] MRN: [id number] DOB: 30-Jan-1946   Care Coordination Outreach Attempts:  An unsuccessful telephone outreach was attempted for a scheduled appointment today.  Follow Up Plan:  Additional outreach attempts will be made to offer the patient care coordination information and services.   Encounter Outcome:  No Answer   Care Coordination Interventions:  No, not indicated    Kathyrn Sheriff, RN, MSN, BSN, CCM Care Management Coordinator [phone]

## 2023-04-22 NOTE — Consult Note (Signed)
Kathleen [name] 1945-11-28  [id number].    Requesting MD: Dr. Corliss Parish Chief Complaint/Reason for Consult: diverticulitis  HPI:  Kathleen [name] is a 77 yo female with multiple medical comorbidities including COPD on oxygen, CHF, tracheobronchomalacia, and DM who has been having intermittent LLQ abdominal pain for about a year. She has had sigmoid diverticulitis, treated with antibiotics, and about 2 months ago started passing stool via the vagina. Previous CT scans have been consistent with a rectovaginal fistula. She was referred to GI for a colonoscopy, which was scheduled for 11/25 with Dr. Meridee Score, however about 3 days ago her LLQ pain became acutely worse. She has also had bloating. She was sent for an outpatient CT today, which showed sigmoid diverticulitis with multiple new abscesses. She was sent to the ED. She has been febrile to 38.8 since arrival.   Prior abdominal surgeries include a hysterectomy, cholecystectomy and hernia repair. She was supposed to see Dr. Michaell Cowing later this month to discuss surgery for her diverticulitis.  ROS: Review of Systems  Constitutional:  Positive for fever and malaise/fatigue.  Gastrointestinal:  Positive for abdominal pain. Negative for blood in stool, diarrhea, nausea and vomiting.    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     Concussion 10/04/2021   COPD (chronic obstructive pulmonary disease) (HCC)    Diverticulitis    Gout    Hypertension    Longstanding persistent atrial fibrillation (HCC)    on Xarelto   Nonischemic cardiomyopathy (HCC)    Obesity    Pacemaker    Spontaneous pneumothorax 2013    Past Surgical History:  Procedure Laterality Date   ABDOMINAL HYSTERECTOMY     APPENDECTOMY     ATRIAL FIBRILLATION ABLATION  07/20/2013   by Dr Christin Fudge   AV nodal ablation  11/01/2013   by Dr Christin Fudge, repeated by Dr Wilford Grist   BREAST BIOPSY Bilateral 1997   negative   CARDIAC CATHETERIZATION     CHOLECYSTECTOMY     HERNIA REPAIR     PACEMAKER INSERTION  06/2017   MDT Viva CRT-P implanted by Dr Christin Fudge after AV nodal ablation,  LV lead could not be placed   PACEMAKER INSERTION  05/2020   with lead bundle   RIGHT/LEFT HEART CATH AND CORONARY ANGIOGRAPHY N/A 03/20/2022   Procedure: RIGHT/LEFT HEART CATH AND CORONARY ANGIOGRAPHY;  Surgeon: Swaziland, Peter M, MD;  Location: [facility] INVASIVE CV LAB;  Service: Cardiovascular;  Laterality: N/A;    Social History:  reports that she quit smoking about 35 years ago. Her smoking use included cigarettes. She started smoking about 55 years ago. She has a 20 pack-year smoking history. She has been exposed to tobacco smoke.  She has never used smokeless tobacco. She reports that she does not drink alcohol and does not use drugs.  Allergies:  Allergies  Allergen Reactions   Allopurinol Other (See Comments)    Reaction:  Dizziness    Clindamycin Anaphylaxis and Hives   Flublok [Influenza Vaccine Recombinant] Other (See Comments)    Fever 103 with no alternative explanation day after vaccine.  Clydie Braun Highfill FNP-C   Pneumococcal 13-Val Conj Vacc Itching, Swelling and Rash   Dronedarone Rash   Montelukast Other (See Comments)    Makes her loopy.   Brovana [Arformoterol]     Caused muscle pain   Budesonide     Caused extreme joint pain   Entresto [Sacubitril-Valsartan]  Other (See Comments)    hypotension   Fosamax [Alendronate Sodium] Nausea Only   Jardiance [Empagliflozin]     Caused a vaginal infection   Meperidine Nausea And Vomiting   Microplegia Msa-Msg [Plegisol]     Loopy,diarrhea   Rosuvastatin Other (See Comments)    Reaction:  Muscle spasms    Tetracycline Hives   Adhesive [Tape] Rash   Lovastatin Rash and Other (See Comments)    Muscle Pain    (Not in a hospital admission)    Physical Exam: Blood pressure 108/73, pulse 72, temperature (!) 101.8 F (38.8 C), temperature source Oral, resp. rate 16, height 5' (1.524 m), weight 79.4 kg, SpO2 100%. General: resting comfortably, appears stated age, no apparent distress Neurological: alert and oriented, no focal deficits, cranial nerves grossly in tact HEENT: normocephalic, atraumatic CV: regular rate and rhythm Respiratory: normal work of breathing on nasal cannula Abdomen: soft, mildly distended, focally tender to palpation in the LLQ. Well-healed umbilical scar. 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 I personally reviewed her labs, imaging studies and notes. She has had intermittent symptoms for about a year, with multiple prior admissions for diverticulitis. She now has an acute flare with multiple small abscesses. A sigmoid resection would be definitive, however she is a high risk surgical candidate given her cardiopulmonary comorbidities. For now, recommend nonoperative treatment.  - NPO, IV fluid hydration - Continue Zosyn - Surgery will follow closely   Sophronia Simas, MD [facility] Surgery General, Hepatobiliary and Pancreatic Surgery 04/22/23 10:13 PM

## 2023-04-22 NOTE — ED Notes (Signed)
RN called CCMD to place pt on monitoring system

## 2023-04-22 NOTE — ED Notes (Signed)
Patient assisted to the restroom 

## 2023-04-23 DIAGNOSIS — K5720 Diverticulitis of large intestine with perforation and abscess without bleeding: Secondary | ICD-10-CM | POA: Diagnosis not present

## 2023-04-23 DIAGNOSIS — A419 Sepsis, unspecified organism: Secondary | ICD-10-CM | POA: Diagnosis not present

## 2023-04-23 DIAGNOSIS — J9611 Chronic respiratory failure with hypoxia: Secondary | ICD-10-CM

## 2023-04-23 DIAGNOSIS — I5042 Chronic combined systolic (congestive) and diastolic (congestive) heart failure: Secondary | ICD-10-CM

## 2023-04-23 DIAGNOSIS — Z01810 Encounter for preprocedural cardiovascular examination: Secondary | ICD-10-CM | POA: Diagnosis not present

## 2023-04-23 DIAGNOSIS — I10 Essential (primary) hypertension: Secondary | ICD-10-CM

## 2023-04-23 DIAGNOSIS — N824 Other female intestinal-genital tract fistulae: Secondary | ICD-10-CM | POA: Diagnosis not present

## 2023-04-23 DIAGNOSIS — Z950 Presence of cardiac pacemaker: Secondary | ICD-10-CM

## 2023-04-23 DIAGNOSIS — J8489 Other specified interstitial pulmonary diseases: Secondary | ICD-10-CM

## 2023-04-23 DIAGNOSIS — J410 Simple chronic bronchitis: Secondary | ICD-10-CM

## 2023-04-23 DIAGNOSIS — I4811 Longstanding persistent atrial fibrillation: Secondary | ICD-10-CM

## 2023-04-23 DIAGNOSIS — I5022 Chronic systolic (congestive) heart failure: Secondary | ICD-10-CM

## 2023-04-23 DIAGNOSIS — Z515 Encounter for palliative care: Secondary | ICD-10-CM

## 2023-04-23 LAB — COMPREHENSIVE METABOLIC PANEL
ALT: 19 U/L (ref 0–44)
AST: 19 U/L (ref 15–41)
Albumin: 2.6 g/dL — ABNORMAL LOW (ref 3.5–5.0)
Alkaline Phosphatase: 44 U/L (ref 38–126)
Anion gap: 11 (ref 5–15)
BUN: 14 mg/dL (ref 8–23)
CO2: 25 mmol/L (ref 22–32)
Calcium: 8.6 mg/dL — ABNORMAL LOW (ref 8.9–10.3)
Chloride: 95 mmol/L — ABNORMAL LOW (ref 98–111)
Creatinine, Ser: 0.85 mg/dL (ref 0.44–1.00)
GFR, Estimated: >60 mL/min (ref >60)
Glucose, Bld: 164 mg/dL — ABNORMAL HIGH (ref 70–99)
Potassium: 4 mmol/L (ref 3.5–5.1)
Sodium: 131 mmol/L — ABNORMAL LOW (ref 135–145)
Total Bilirubin: 1.1 mg/dL (ref <1.2)
Total Protein: 5.9 g/dL — ABNORMAL LOW (ref 6.5–8.1)

## 2023-04-23 LAB — CBC WITH DIFFERENTIAL/PLATELET
Abs Immature Granulocytes: 0.08 10*3/uL — ABNORMAL HIGH (ref 0.00–0.07)
Basophils Absolute: 0 10*3/uL (ref 0.0–0.1)
Basophils Relative: 0 %
Eosinophils Absolute: 0.1 10*3/uL (ref 0.0–0.5)
Eosinophils Relative: 1 %
HCT: 31.8 % — ABNORMAL LOW (ref 36.0–46.0)
Hemoglobin: 10 g/dL — ABNORMAL LOW (ref 12.0–15.0)
Immature Granulocytes: 1 %
Lymphocytes Relative: 7 %
Lymphs Abs: 0.7 10*3/uL (ref 0.7–4.0)
MCH: 25.9 pg — ABNORMAL LOW (ref 26.0–34.0)
MCHC: 31.4 g/dL (ref 30.0–36.0)
MCV: 82.4 fL (ref 80.0–100.0)
Monocytes Absolute: 0.9 10*3/uL (ref 0.1–1.0)
Monocytes Relative: 9 %
Neutro Abs: 8.1 10*3/uL — ABNORMAL HIGH (ref 1.7–7.7)
Neutrophils Relative %: 82 %
Platelets: 202 10*3/uL (ref 150–400)
RBC: 3.86 MIL/uL — ABNORMAL LOW (ref 3.87–5.11)
RDW: 15.6 % — ABNORMAL HIGH (ref 11.5–15.5)
WBC: 9.9 10*3/uL (ref 4.0–10.5)
nRBC: 0 % (ref 0.0–0.2)

## 2023-04-23 LAB — HEMOGLOBIN A1C
Hgb A1c MFr Bld: 6.2 % — ABNORMAL HIGH (ref 4.8–5.6)
Mean Plasma Glucose: 131.24 mg/dL

## 2023-04-23 LAB — URINALYSIS, W/ REFLEX TO CULTURE (INFECTION SUSPECTED)
Bilirubin Urine: NEGATIVE
Glucose, UA: NEGATIVE mg/dL
Ketones, ur: NEGATIVE mg/dL
Nitrite: NEGATIVE
Protein, ur: 30 mg/dL — AB
Specific Gravity, Urine: 1.024 (ref 1.005–1.030)
WBC, UA: >50 WBC/hpf (ref 0–5)
pH: 5 (ref 5.0–8.0)

## 2023-04-23 LAB — LACTIC ACID, PLASMA: Lactic Acid, Venous: 0.8 mmol/L (ref 0.5–1.9)

## 2023-04-23 LAB — MAGNESIUM: Magnesium: 2 mg/dL (ref 1.7–2.4)

## 2023-04-23 MED ORDER — FUROSEMIDE 10 MG/ML IJ SOLN
40.0000 mg | Freq: Once | INTRAMUSCULAR | Status: AC
  Administered 2023-04-24: 40 mg via INTRAVENOUS
  Filled 2023-04-23: qty 4

## 2023-04-23 MED ORDER — CHLORHEXIDINE GLUCONATE CLOTH 2 % EX PADS
6.0000 | MEDICATED_PAD | Freq: Once | CUTANEOUS | Status: AC
  Administered 2023-04-25: 6 via TOPICAL

## 2023-04-23 MED ORDER — FUROSEMIDE 10 MG/ML IJ SOLN
60.0000 mg | Freq: Once | INTRAMUSCULAR | Status: AC
  Administered 2023-04-23: 60 mg via INTRAVENOUS
  Filled 2023-04-23: qty 6

## 2023-04-23 MED ORDER — OXYCODONE HCL 5 MG PO TABS: 10.0000 mg | ORAL_TABLET | Freq: Four times a day (QID) | ORAL | Status: DC | PRN

## 2023-04-23 MED ORDER — ENSURE ENLIVE PO LIQD
237.0000 mL | Freq: Two times a day (BID) | ORAL | Status: DC
  Administered 2023-04-23: 237 mL via ORAL

## 2023-04-23 MED ORDER — ENSURE PRE-SURGERY PO LIQD
592.0000 mL | Freq: Once | ORAL | Status: AC
  Administered 2023-04-25: 592 mL via ORAL
  Filled 2023-04-23: qty 592

## 2023-04-23 MED ORDER — BOOST / RESOURCE BREEZE PO LIQD CUSTOM
1.0000 | Freq: Two times a day (BID) | ORAL | Status: DC
  Administered 2023-04-25 (×2): 1 via ORAL

## 2023-04-23 MED ORDER — SODIUM CHLORIDE 0.9 % IV SOLN: 2.0000 g | INTRAVENOUS | Status: DC

## 2023-04-23 MED ORDER — ENSURE PRE-SURGERY PO LIQD
296.0000 mL | Freq: Once | ORAL | Status: DC
  Filled 2023-04-23: qty 296

## 2023-04-23 MED ORDER — SPIRONOLACTONE 25 MG PO TABS
25.0000 mg | ORAL_TABLET | Freq: Two times a day (BID) | ORAL | Status: DC
  Administered 2023-04-23: 25 mg via ORAL
  Filled 2023-04-23: qty 1

## 2023-04-23 MED ORDER — DOCUSATE SODIUM 100 MG PO CAPS
100.0000 mg | ORAL_CAPSULE | Freq: Every day | ORAL | Status: DC
  Administered 2023-04-23 – 2023-04-26 (×4): 100 mg via ORAL
  Filled 2023-04-23 (×4): qty 1

## 2023-04-23 MED ORDER — ENSURE PRE-SURGERY PO LIQD
592.0000 mL | Freq: Once | ORAL | Status: DC
  Filled 2023-04-23: qty 592

## 2023-04-23 MED ORDER — CHLORHEXIDINE GLUCONATE CLOTH 2 % EX PADS
6.0000 | MEDICATED_PAD | Freq: Once | CUTANEOUS | Status: AC
  Administered 2023-04-26: 6 via TOPICAL

## 2023-04-23 MED ORDER — OXYCODONE HCL 5 MG PO TABS
5.0000 mg | ORAL_TABLET | ORAL | Status: DC | PRN
  Administered 2023-04-23 – 2023-04-26 (×5): 5 mg via ORAL
  Filled 2023-04-23 (×5): qty 1

## 2023-04-23 MED ORDER — TORSEMIDE 20 MG PO TABS: 40.0000 mg | ORAL_TABLET | Freq: Every day | ORAL | Status: DC

## 2023-04-23 MED ORDER — ENOXAPARIN SODIUM 40 MG/0.4ML IJ SOSY: 40.0000 mg | PREFILLED_SYRINGE | Freq: Once | INTRAMUSCULAR | Status: DC

## 2023-04-23 MED ORDER — CHLORHEXIDINE GLUCONATE CLOTH 2 % EX PADS: 6.0000 | MEDICATED_PAD | Freq: Once | CUTANEOUS | Status: DC

## 2023-04-23 MED ORDER — ENOXAPARIN SODIUM 80 MG/0.8ML IJ SOSY
80.0000 mg | PREFILLED_SYRINGE | Freq: Two times a day (BID) | INTRAMUSCULAR | Status: DC
  Administered 2023-04-23 – 2023-04-26 (×8): 80 mg via SUBCUTANEOUS
  Filled 2023-04-23 (×8): qty 0.8

## 2023-04-23 MED ORDER — ENSURE PRE-SURGERY PO LIQD
296.0000 mL | Freq: Once | ORAL | Status: AC
  Administered 2023-04-26: 296 mL via ORAL
  Filled 2023-04-23: qty 296

## 2023-04-23 MED ORDER — ADULT MULTIVITAMIN W/MINERALS CH
1.0000 | ORAL_TABLET | Freq: Every day | ORAL | Status: DC
  Administered 2023-04-23 – 2023-04-26 (×4): 1 via ORAL
  Filled 2023-04-23 (×4): qty 1

## 2023-04-23 MED ORDER — ALBUTEROL SULFATE (2.5 MG/3ML) 0.083% IN NEBU
2.5000 mg | INHALATION_SOLUTION | RESPIRATORY_TRACT | Status: DC | PRN
  Administered 2023-05-04 – 2023-05-21 (×5): 2.5 mg via RESPIRATORY_TRACT
  Filled 2023-04-23 (×6): qty 3

## 2023-04-23 MED ORDER — RIVAROXABAN 20 MG PO TABS: 20.0000 mg | ORAL_TABLET | Freq: Every day | ORAL | Status: DC

## 2023-04-23 MED ORDER — POTASSIUM CHLORIDE 20 MEQ PO PACK
20.0000 meq | PACK | Freq: Once | ORAL | Status: AC
  Administered 2023-04-23: 20 meq via ORAL
  Filled 2023-04-23: qty 1

## 2023-04-23 NOTE — Progress Notes (Signed)
Subjective/Chief Complaint: Patient without complaint except diffuse lower abdominal pain.  Continues to have mild to moderate vaginal discharge.  Patient has known colovesical fistula.  Patient was scheduled for colonoscopy as an outpatient by [facility].  She was admitted due to increasing pain.  She has had multiple bouts of diverticulitis over the last year and has been in and out of the hospital treated medically but unfortunately has failed all nonoperative measures at this point in time.   Objective: Vital signs in last 24 hours: Temp:  [98.1 F (36.7 C)-101.8 F (38.8 C)] 98.1 F (36.7 C) (11/15 0803) Pulse Rate:  [71-92] 72 (11/15 0830) Resp:  [14-22] 18 (11/15 0830) BP: (92-125)/(56-99) 92/57 (11/15 0803) SpO2:  [99 %-100 %] 100 % (11/15 0830) FiO2 (%):  [36 %] 36 % (11/15 0830) Weight:  [79.4 kg] 79.4 kg (11/14 1829) Last BM Date : 04/22/23 (pta)  Intake/Output from previous day: No intake/output data recorded. Intake/Output this shift: No intake/output data recorded.  GI: Tender to palpation left lower quadrant and right lower quadrant.  Lab Results:  Recent Labs    04/22/23 1840 04/23/23 0710  WBC 11.9* 9.9  HGB 11.6* 10.0*  HCT 36.2 31.8*  PLT 246 202   BMET Recent Labs    04/22/23 1840 04/23/23 0710  NA 129* 131*  K 3.8 4.0  CL 90* 95*  CO2 26 25  GLUCOSE 162* 164*  BUN 19 14  CREATININE 1.11* 0.85  CALCIUM 9.0 8.6*   PT/INR Recent Labs    04/22/23 1840  LABPROT 20.0*  INR 1.7*   ABG No results for input(s): "PHART", "HCO3" in the last 72 hours.  Invalid input(s): "PCO2", "PO2"  Studies/Results: DG Chest Port 1 View  Result Date: 04/22/2023 CLINICAL DATA:  Dark stool, bowel perforation EXAM: PORTABLE CHEST 1 VIEW COMPARISON:  CT 04/22/2023,, chest x-ray 04/09/2023 FINDINGS: Left-sided multi lead pacing device as before. Enlarged cardiomediastinal silhouette with vascular congestion. Aortic atherosclerosis. Minimal atelectasis or  scarring at left base. No pleural effusion. IMPRESSION: Enlarged cardiomediastinal silhouette with mild vascular congestion. Electronically Signed   By: Jasmine Pang M.D.   On: 04/22/2023 22:01   CT ABDOMEN PELVIS W CONTRAST  Result Date: 04/22/2023 CLINICAL DATA:  Sigmoid diverticulitis. Colovaginal fistula. Chronic constipation. EXAM: CT ABDOMEN AND PELVIS WITH CONTRAST TECHNIQUE: Multidetector CT imaging of the abdomen and pelvis was performed using the standard protocol following bolus administration of intravenous contrast. RADIATION DOSE REDUCTION: This exam was performed according to the departmental dose-optimization program which includes automated exposure control, adjustment of the mA and/or kV according to patient size and/or use of iterative reconstruction technique. CONTRAST:  OMNIPAQUE IOHEXOL 300 MG/ML  SOLN COMPARISON:  04/09/2023 FINDINGS: Lower Chest: No acute findings. Hepatobiliary: No suspicious hepatic masses identified. Prior cholecystectomy. No evidence of biliary obstruction. Pancreas:  No mass or inflammatory changes. Spleen: Within normal limits in size and appearance. Adrenals/Urinary Tract: No suspicious masses identified. No evidence of ureteral calculi or hydronephrosis. Stomach/Bowel: Moderate sigmoid diverticulitis has increased since previous study. New focal inflammatory collection with internal gas bubbles seen in the central sigmoid mesocolon measuring 4.1 x 3.0 cm, consistent with localized perforation. No No evidence of free intraperitoneal air. A new rim enhancing fluid collection is seen in the right perirectal region measuring 5.6 x 4.0 cm, consistent with abscess. A new rim enhancing fluid collection is also seen in the anterior left lower quadrant which measures 6.0 x 4.1 cm, also consistent with abscess. A small amount of  gas is again seen in the vagina, suspicious for colovaginal fistula. Increased mild reactive wall thickening of the adjacent urinary  bladder also seen. Vascular/Lymphatic: No pathologically enlarged lymph nodes. No acute vascular findings. Reproductive:  Prior hysterectomy. Other:  None. Musculoskeletal:  No suspicious bone lesions identified. IMPRESSION: Worsening sigmoid diverticulitis, with new 4.1 cm focal inflammatory collection with gas bubbles in the central sigmoid mesocolon, consistent with localized perforation. New 5.6 cm right perirectal abscess. New 6.0 cm left lower quadrant abscess. Persistent small amount of gas in the vagina, suspicious for colovaginal fistula. Electronically Signed   By: Danae Orleans M.D.   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  She was to see Dr. Michaell Cowing as an outpatient but unfortunately keeps getting admitted to the hospital due to persistent symptoms.  She has significant comorbidities and roughly a 10% risk of cardiac issues due to her congestive heart failure.  I do not feel she could be done as an outpatient and discussed this with her and her family.  I do not feel she will improve significantly without surgical management.  There are fluid collections that can be drained but unfortunately I do not think she will get better given her overall course reviewing her chart.  I do feel she would benefit from a colonoscopy and recommend having GI see her while she is in house to consider colonoscopy.  Plan will be for gentle bowel prep on Sunday.  Dr. Dwain Sarna to assume care next week and can  decide on ultimate surgical management.  I do feel she will need a colectomy and colostomy at some point but ideally a colonoscopy upfront would be helpful since has not been done.  Allow regular diet for now.  Gentle bowel prep Sunday with primary team reassessing Monday for timing of surgery.   LOS: 1 day    Clovis Pu Jashae Wiggs MD  04/23/2023 H[facility] COMPLEXITY

## 2023-04-23 NOTE — Progress Notes (Addendum)
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  She had an outpatient CT of abdomen and pelvis done as per Dr. Meridee Score which showed worsening diverticulitis with thickening of bowels, 1.2 cm localized perforation, 5.6 cm perirectal abscess.  On presentation, Tmax was 101.8, sodium was 129.  She was started on IV antibiotics.  General surgery was consulted.  Assessment & Plan:   Recurrent sigmoid diverticulitis with an associated colovaginal fistula now presenting with no localized perforation and peritoneal abscess -General Surgery following.  Currently on IV Zosyn.  Follow cultures.  Diet advancement as per general surgery.  Leukocytosis -Labs pending today.  Monitor  Hyponatremia -Labs pending today.  Monitor  Persistent A-fib -Switch Xarelto to Lovenox in case patient needs surgical intervention.  Currently rate controlled.  Continue Tikosyn  Chronic systolic heart failure -Currently compensated.  Did not tolerate Entresto and beta-blocker due to low blood pressure.  Blood pressure on the lower side.  Hold torsemide and spironolactone till evaluation by cardiology.  Strict input and output.  Daily weights.  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  Continue current inhaled and nebulizer regimen  OSA -On BiPAP at night  Diabetes mellitus type 2 with diabetic neuropathy -Continue gabapentin.  Continue CBGs  Obesity -Outpatient follow-up  Physical deconditioning -PT eval once more stable  Goals of care -Palliative care consultation for goals of discussion.  Overall prognosis is guarded to poor.   DVT prophylaxis: Xarelto plan as above Code Status: DNR Family Communication: Son at bedside Disposition Plan: Status is: Inpatient Remains inpatient appropriate because: Of severity of illness    Consultants: General surgery.  GI.  Consult cardiology and palliative care  Procedures: None  Antimicrobials:  Anti-infectives (From admission, onward)    Start     Dose/Rate Route Frequency Ordered Stop   04/22/23 2300  piperacillin-tazobactam (ZOSYN) IVPB 3.375 g        3.375 g 12.5 mL/hr over 240 Minutes Intravenous Every 8 hours 04/22/23 2256     04/22/23 1900  piperacillin-tazobactam (ZOSYN) IVPB 3.375 g        3.375 g 100 mL/hr over 30 Minutes Intravenous  Once 04/22/23 1852 04/22/23 1954        Subjective: Patient seen and examined at bedside.  Complains of intermittent abdominal pain.  Had fever yesterday afternoon.  No chest pain or worsening shortness of breath reported.  Objective: Vitals:   04/22/23 2100 04/22/23 2324 04/23/23 0023 04/23/23 0414  BP: 108/73 119/75 119/70 105/67  Pulse: 72 75 85 77  Resp: 16 (!) 22 16 20   Temp:  99.2 F (37.3 C) 99.1 F (37.3 C) 98.5 F (36.9 C)  TempSrc:  Oral Oral Oral  SpO2: 100% 100% 99% 100%  Weight:      Height:       No intake or output  data in the 24 hours ending 04/23/23 0725 Filed Weights   04/22/23 1829  Weight: 79.4 kg    Examination:  General exam: Appears calm and comfortable.  Looks chronically ill and deconditioned.  On 4 L oxygen via nasal cannula Respiratory system: Bilateral decreased breath sounds at bases with scattered  crackles Cardiovascular system: S1 & S2 heard, Rate controlled Gastrointestinal system: Abdomen is obese, nondistended, soft and mildly tender in the lower quadrant. Normal bowel sounds heard. Extremities: No cyanosis, clubbing; trace lower extremity edema present Central nervous system: Alert and oriented. No focal neurological deficits. Moving extremities Skin: No rashes, lesions or ulcers Psychiatry: Judgement and insight appear normal. Mood & affect appropriate.     Data Reviewed: I have personally reviewed following labs and imaging studies  CBC: Recent Labs  Lab 04/22/23 1840  WBC 11.9*  NEUTROABS 9.9*  HGB 11.6*  HCT 36.2  MCV 81.3  PLT 246   Basic Metabolic Panel: Recent Labs  Lab 04/19/23 1534 04/22/23 1840 04/22/23 2211  NA 137 129*  --   K 3.9 3.8  --   CL 94* 90*  --   CO2 30 26  --   GLUCOSE 164* 162*  --   BUN 20 19  --   CREATININE 0.81 1.11*  --   CALCIUM 10.5* 9.0  --   MG 1.7  --  1.9   GFR: Estimated Creatinine Clearance: 40.2 mL/min (A) (by C-G formula based on SCr of 1.11 mg/dL (H)). Liver Function Tests: Recent Labs  Lab 04/22/23 1840  AST 28  ALT 21  ALKPHOS 57  BILITOT 1.2*  PROT 6.4*  ALBUMIN 2.9*   Recent Labs  Lab 04/22/23 1840  LIPASE 30   No results for input(s): "AMMONIA" in the last 168 hours. Coagulation Profile: Recent Labs  Lab 04/22/23 1840  INR 1.7*   Cardiac Enzymes: No results for input(s): "CKTOTAL", "CKMB", "CKMBINDEX", "TROPONINI" in the last 168 hours. BNP (last 3 results) No results for input(s): "PROBNP" in the last 8760 hours. HbA1C: No results for input(s): "HGBA1C" in the last 72 hours. CBG: Recent Labs  Lab 04/22/23 2144  GLUCAP 136*   Lipid Profile: No results for input(s): "CHOL", "HDL", "LDLCALC", "TRIG", "CHOLHDL", "LDLDIRECT" in the last 72 hours. Thyroid Function Tests: No results for input(s): "TSH", "T4TOTAL", "FREET4", "T3FREE", "THYROIDAB" in the last 72 hours. Anemia Panel: No  results for input(s): "VITAMINB12", "FOLATE", "FERRITIN", "TIBC", "IRON", "RETICCTPCT" in the last 72 hours. Sepsis Labs: Recent Labs  Lab 04/22/23 1905 04/22/23 2211 04/23/23 0114  LATICACIDVEN 1.8 1.1 0.8    Recent Results (from the past 240 hour(s))  Resp panel by RT-PCR (RSV, Flu A&B, Covid) Anterior Nasal Swab     Status: None   Collection Time: 04/22/23  6:38 PM   Specimen: Anterior Nasal Swab  Result Value Ref Range Status   SARS Coronavirus 2 by RT PCR NEGATIVE NEGATIVE Final   Influenza A by PCR NEGATIVE NEGATIVE Final   Influenza B by PCR NEGATIVE NEGATIVE Final    Comment: (NOTE) The Xpert Xpress SARS-CoV-2/FLU/RSV plus assay is intended as an aid in the diagnosis of influenza from Nasopharyngeal swab specimens and should not be used as a sole basis for treatment. Nasal washings and aspirates are unacceptable for Xpert Xpress SARS-CoV-2/FLU/RSV testing.  Fact Sheet for Patients: BloggerCourse.com  Fact Sheet for Healthcare Providers: SeriousBroker.it  This test is not yet approved or cleared by the [place] FDA and has been authorized for detection and/or diagnosis of  SARS-CoV-2 by FDA under an Emergency Use Authorization (EUA). This EUA will remain in effect (meaning this test can be used) for the duration of the COVID-19 declaration under Section 564(b)(1) of the Act, 21 U.S.C. section 360bbb-3(b)(1), unless the authorization is terminated or revoked.     Resp Syncytial Virus by PCR NEGATIVE NEGATIVE Final    Comment: (NOTE) Fact Sheet for Patients: BloggerCourse.com  Fact Sheet for Healthcare Providers: SeriousBroker.it  This test is not yet approved or cleared by the [place] FDA and has been authorized for detection and/or diagnosis of SARS-CoV-2 by FDA under an Emergency Use Authorization (EUA). This EUA will remain in effect (meaning this  test can be used) for the duration of the COVID-19 declaration under Section 564(b)(1) of the Act, 21 U.S.C. section 360bbb-3(b)(1), unless the authorization is terminated or revoked.  Performed at [facility], [address] [place], [place] [address]   Blood Culture (routine x 2)     Status: None (Preliminary result)   Collection Time: 04/22/23  6:40 PM   Specimen: BLOOD RIGHT ARM  Result Value Ref Range Status   Specimen Description BLOOD RIGHT ARM  Final   Special Requests   Final    BOTTLES DRAWN AEROBIC AND ANAEROBIC Blood Culture adequate volume   Culture   Final    NO GROWTH < 12 HOURS Performed at [facility], [address], [place], [place] [address]    Report Status PENDING  Incomplete  Blood Culture (routine x 2)     Status: None (Preliminary result)   Collection Time: 04/22/23  7:08 PM   Specimen: BLOOD LEFT ARM  Result Value Ref Range Status   Specimen Description BLOOD LEFT ARM  Final   Special Requests   Final    BOTTLES DRAWN AEROBIC AND ANAEROBIC Blood Culture adequate volume   Culture   Final    NO GROWTH < 12 HOURS Performed at [facility], [address], [place], [place] [address]    Report Status PENDING  Incomplete         Radiology Studies: DG Chest Port 1 View  Result Date: 04/22/2023 CLINICAL DATA:  Dark stool, bowel perforation EXAM: PORTABLE CHEST 1 VIEW COMPARISON:  CT 04/22/2023,, chest x-ray 04/09/2023 FINDINGS: Left-sided multi lead pacing device as before. Enlarged cardiomediastinal silhouette with vascular congestion. Aortic atherosclerosis. Minimal atelectasis or scarring at left base. No pleural effusion. IMPRESSION: Enlarged cardiomediastinal silhouette with mild vascular congestion. Electronically Signed   By: Jasmine Pang M.D.   On: 04/22/2023 22:01   CT ABDOMEN PELVIS W CONTRAST  Result Date: 04/22/2023 CLINICAL DATA:  Sigmoid diverticulitis. Colovaginal fistula. Chronic constipation. EXAM: CT ABDOMEN AND  PELVIS WITH CONTRAST TECHNIQUE: Multidetector CT imaging of the abdomen and pelvis was performed using the standard protocol following bolus administration of intravenous contrast. RADIATION DOSE REDUCTION: This exam was performed according to the departmental dose-optimization program which includes automated exposure control, adjustment of the mA and/or kV according to patient size and/or use of iterative reconstruction technique. CONTRAST:  OMNIPAQUE IOHEXOL 300 MG/ML  SOLN COMPARISON:  04/09/2023 FINDINGS: Lower Chest: No acute findings. Hepatobiliary: No suspicious hepatic masses identified. Prior cholecystectomy. No evidence of biliary obstruction. Pancreas:  No mass or inflammatory changes. Spleen: Within normal limits in size and appearance. Adrenals/Urinary Tract: No suspicious masses identified. No evidence of ureteral calculi or hydronephrosis. Stomach/Bowel: Moderate sigmoid diverticulitis has increased since previous study. New focal inflammatory collection with internal gas bubbles seen in the  central sigmoid mesocolon measuring 4.1 x 3.0 cm, consistent with localized perforation. No No evidence of free intraperitoneal air. A new rim enhancing fluid collection is seen in the right perirectal region measuring 5.6 x 4.0 cm, consistent with abscess. A new rim enhancing fluid collection is also seen in the anterior left lower quadrant which measures 6.0 x 4.1 cm, also consistent with abscess. A small amount of gas is again seen in the vagina, suspicious for colovaginal fistula. Increased mild reactive wall thickening of the adjacent urinary bladder also seen. Vascular/Lymphatic: No pathologically enlarged lymph nodes. No acute vascular findings. Reproductive:  Prior hysterectomy. Other:  None. Musculoskeletal:  No suspicious bone lesions identified. IMPRESSION: Worsening sigmoid diverticulitis, with new 4.1 cm focal inflammatory collection with gas bubbles in the central sigmoid mesocolon, consistent  with localized perforation. New 5.6 cm right perirectal abscess. New 6.0 cm left lower quadrant abscess. Persistent small amount of gas in the vagina, suspicious for colovaginal fistula. Electronically Signed   By: Danae Orleans M.D.   On: 04/22/2023 16:43        Scheduled Meds:  acetaminophen  650 mg Oral Once   docusate sodium  100 mg Oral Daily   dofetilide  250 mcg Oral Q12H   fluticasone furoate-vilanterol  1 puff Inhalation Daily   gabapentin  200 mg Oral QHS   pantoprazole  40 mg Oral Daily   potassium chloride SA  40 mEq Oral Daily   revefenacin  175 mcg Nebulization Daily   rivaroxaban  20 mg Oral Q supper   spironolactone  25 mg Oral BID   torsemide  40 mg Oral Daily   Continuous Infusions:  0.9 % NaCl with KCl 20 mEq / L 50 mL/hr at 04/22/23 2248   piperacillin-tazobactam (ZOSYN)  IV 3.375 g (04/23/23 0559)   promethazine (PHENERGAN) injection (IM or IVPB)            Glade Lloyd, MD [facility] 04/23/2023, 7:25 AM

## 2023-04-23 NOTE — Consult Note (Addendum)
Cardiology Consultation   Patient ID: Kathleen [name] MRN: [id number]; DOB: [birth year]  Admit date: 04/22/2023 Date of Consult: 04/23/2023  PCP:  [name], MD   [facility] Providers Cardiologist:  None  Electrophysiologist:  Will Jorja Loa, MD  Advanced Heart Failure:  Arvilla Meres, MD   Patient Profile:   Kathleen [name] is a 77 y.o. female with a hx of chronic HFrEF, nonischemic cardiomyopathy, atrial fibrillation status post ablation 2015, Medtronic PPM 2017, COPD chronically on supplemental oxygen 4 L, OSA on CPAP, BOOP tracheobronchomalacia, recurrent diverticulitis, colovesical fistula, who is being seen 04/23/2023 for the evaluation of CHF/preoperative evaluation at the request of Dr. Hanley Ben.  History of Present Illness:   Kathleen [name] is followed by advanced heart failure last seen on 04/19/2023 noted to be overall stable without significant complaints.  She has chronic HFrEF with EF generally around 35 to 40%.  GDMT difficult to titrate due to low blood pressures did not tolerate Entresto.  She had previous right and left heart catheterization 03/20/2022 that demonstrated minor nonobstructive CAD 25% stenosis in the mid LAD, 10% stenosis in the circumflex.  Normal filling pressures, mild pulmonary hypertension with preserved cardiac output.  Also has history of atrial fibrillation with AV nodal ablation in 2015 with PPM.  She is on Xarelto and Tikosyn.  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  From prior surgery notes it does appear that they would like to pursue surgical management with either colectomy and colostomy but would like to do a colonoscopy prior to this.  They have possible plans to operate sometime next week.   Cardiology has been asked to see to preoperatively evaluate and assess CHF  Overall it appears patient does decently well from a heart failure standpoint and seems compensated.  She is managed on torsemide 40 mg daily reports this been effective for her for maintaining fluid balance.  She lives in a mixed independent/memory facility.  She states that she is able to ambulate without mechanical assistance unless going downstairs and when she needs a rollator.  Denies any chest pain but does mention some GERD like symptoms in which she says her PPI helps with this.  Unfortunately, she does persistently struggle with orthopnea and always sleeps on a pillow.  Currently, she feels slightly more short of breath more peripheral edema, but on home oxygen requirements.  Today primary team has held her spironolactone and her torsemide due to low blood pressures 90s systolic.  Chest x-ray indicating mild vascular congestion.  Sodium 131 improved from 129 on admission.  Potassium 4.  Magnesium 2.0.  Troponins negative x 2.  Creatinine 0.85.  WBC normalized initially with 11.9 now 9.9.  Hemoglobin 10.  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    Concussion 10/04/2021   COPD (chronic obstructive pulmonary disease) (HCC)    Diverticulitis    Gout    Hypertension    Longstanding persistent atrial fibrillation (HCC)    on Xarelto   Nonischemic cardiomyopathy (HCC)    Obesity    Pacemaker    Spontaneous pneumothorax  2013    Past Surgical History:  Procedure Laterality Date   ABDOMINAL HYSTERECTOMY     APPENDECTOMY     ATRIAL FIBRILLATION ABLATION  07/20/2013   by Dr Christin Fudge   AV nodal ablation  11/01/2013   by Dr Christin Fudge, repeated by Dr Wilford Grist   BREAST BIOPSY Bilateral 1997   negative    CARDIAC CATHETERIZATION     CHOLECYSTECTOMY     HERNIA REPAIR     PACEMAKER INSERTION  06/2017   MDT Viva CRT-P implanted by Dr Christin Fudge after AV nodal ablation,  LV lead could not be placed   PACEMAKER INSERTION  05/2020   with lead bundle   RIGHT/LEFT HEART CATH AND CORONARY ANGIOGRAPHY N/A 03/20/2022   Procedure: RIGHT/LEFT HEART CATH AND CORONARY ANGIOGRAPHY;  Surgeon: Swaz[place], Peter M, MD;  Location: [facility] INVASIVE CV LAB;  Service: Cardiovascular;  Laterality: N/A;     Inpatient Medications: Scheduled Meds:  acetaminophen  650 mg Oral Once   docusate sodium  100 mg Oral Daily   dofetilide  250 mcg Oral Q12H   enoxaparin (LOVENOX) injection  80 mg Subcutaneous BID   feeding supplement  237 mL Oral BID BM   fluticasone furoate-vilanterol  1 puff Inhalation Daily   gabapentin  200 mg Oral QHS   multivitamin with minerals  1 tablet Oral Daily   pantoprazole  40 mg Oral Daily   revefenacin  175 mcg Nebulization Daily   Continuous Infusions:  0.9 % NaCl with KCl 20 mEq / L 50 mL/hr at 04/22/23 2248   piperacillin-tazobactam (ZOSYN)  IV 3.375 g (04/23/23 0559)   promethazine (PHENERGAN) injection (IM or IVPB)     PRN Meds: acetaminophen **OR** acetaminophen, albuterol, HYDROmorphone (DILAUDID) injection, oxyCODONE, promethazine (PHENERGAN) injection (IM or IVPB)  Allergies:    Allergies  Allergen Reactions   Allopurinol Other (See Comments)    Reaction:  Dizziness    Clindamycin Anaphylaxis and Hives   Flublok [Influenza Vaccine Recombinant] Other (See Comments)    Fever 103 with no alternative explanation day after vaccine.  Clydie Braun Highfill FNP-C   Pneumococcal 13-Val Conj Vacc Itching, Swelling and Rash   Dronedarone Rash   Montelukast Other (See Comments)    Makes her loopy.   Brovana [Arformoterol]     Caused muscle pain   Budesonide     Caused extreme joint pain   Entresto [Sacubitril-Valsartan] Other (See Comments)    hypotension   Fosamax [Alendronate Sodium] Nausea  Only   Jardiance [Empagliflozin]     Caused a vaginal infection   Meperidine Nausea And Vomiting   Microplegia Msa-Msg [Plegisol]     Loopy,diarrhea   Rosuvastatin Other (See Comments)    Reaction:  Muscle spasms    Tetracycline Hives   Adhesive [Tape] Rash   Lovastatin Rash and Other (See Comments)    Muscle Pain    Social History:   Social History   Socioeconomic History   Marital status: Widowed    Spouse name: Not on file   Number of children: 1   Years of education: Not on file   Highest education level: Not on file  Occupational History   Occupation: retired  Tobacco Use   Smoking status: Former    Current packs/day: 0.00    Average packs/day: 1 pack/day for 20.0 years (20.0 ttl pk-yrs)    Types: Cigarettes    Start date: 06/09/1967    Quit date: 06/09/1987    Years since quitting: 35.8    Passive exposure: Past  Smokeless tobacco: Never   Tobacco comments:    Former smoker 12/25/21  Vaping Use   Vaping status: Never Used  Substance and Sexual Activity   Alcohol use: No    Alcohol/week: 0.0 standard drinks of alcohol   Drug use: No   Sexual activity: Not Currently  Other Topics Concern   Not on file  Social History Narrative   Pt lives in [place] alone.  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  Her son works in [employer].   Social Determinants of Health   Financial Resource Strain: Low Risk  (05/21/2022)   Overall Financial Resource Strain (CARDIA)    Difficulty of Paying Living Expenses: Not very hard  Food Insecurity: No Food Insecurity (04/22/2023)   Hunger Vital Sign    Worried About Running Out of Food in the Last Year: Never true    Ran Out of Food in the Last Year: Never true  Transportation Needs: No Transportation Needs (04/22/2023)   PRAPARE - Administrator, Civil Service (Medical): No    Lack of Transportation (Non-Medical): No  Physical Activity: Insufficiently Active (05/21/2022)   Exercise Vital Sign    Days of  Exercise per Week: 2 days    Minutes of Exercise per Session: 20 min  Stress: No Stress Concern Present (05/21/2022)   [employer] of Occupational Health - Occupational Stress Questionnaire    Feeling of Stress : Only a little  Social Connections: Moderately Isolated (05/21/2022)   Social Connection and Isolation Panel [NHANES]    Frequency of Communication with Friends and Family: More than three times a week    Frequency of Social Gatherings with Friends and Family: More than three times a week    Attends Religious Services: Never    Database administrator or Organizations: Yes    Attends Engineer, structural: More than 4 times per year    Marital Status: Widowed  Intimate Partner Violence: Not At Risk (04/22/2023)   Humiliation, Afraid, Rape, and Kick questionnaire    Fear of Current or Ex-Partner: No    Emotionally Abused: No    Physically Abused: No    Sexually Abused: No    Family History:   Family History  Problem Relation Age of Onset   Breast cancer Mother 68       3 different times   Pancreatic cancer Mother    Emphysema Father    Healthy Brother    Breast cancer Cousin    Valvular heart disease Son    Obesity Daughter    Colon cancer Neg Hx    Esophageal cancer Neg Hx    Stomach cancer Neg Hx    Inflammatory bowel disease Neg Hx    Liver disease Neg Hx    Rectal cancer Neg Hx      ROS:  Please see the history of present illness.  All other ROS reviewed and negative.     Physical Exam/Data:   Vitals:   04/23/23 0023 04/23/23 0414 04/23/23 0803 04/23/23 0830  BP: 119/70 105/67 (!) 92/57   Pulse: 85 77 71 72  Resp: 16 20 19 18   Temp: 99.1 F (37.3 C) 98.5 F (36.9 C) 98.1 F (36.7 C)   TempSrc: Oral Oral Oral   SpO2: 99% 100% 100% 100%  Weight:      Height:        Intake/Output Summary (Last 24 hours) at 04/23/2023 1414 Last data filed at 04/23/2023 1100 Gross  per 24 hour  Intake 0 ml  Output 200 ml  Net -200 ml       04/22/2023    6:29 PM 04/19/2023    2:31 PM 04/12/2023   10:17 AM  Last 3 Weights  Weight (lbs) 175 lb 183 lb 2 oz 178 lb 6.4 oz  Weight (kg) 79.379 kg 83.065 kg 80.922 kg     Body mass index is 34.18 kg/m.  General:  Well nourished, well developed, in no acute distress on supplemental oxygen 4 L HEENT: normal Neck: Mild JVD Vascular: No carotid bruits; Distal pulses 2+ bilaterally Cardiac:  normal S1, S2; RRR; no murmur  Lungs: Crackles Abd: slightly distended, firm abdomen Ext: Mild edema Musculoskeletal:  No deformities, BUE and BLE strength normal and equal Skin: warm and dry  Neuro:  CNs 2-12 intact, no focal abnormalities noted Psych:  Normal affect   EKG:  The EKG was personally reviewed and demonstrates: Paced, sinus, heart rate 71.  PR 184.  QTc 410.  Left bundle branch block.  PAC. Telemetry:  Telemetry was personally reviewed and demonstrates: intermittent biventricularly paced 80s  Relevant CV Studies: Echocardiogram 09/15/2022 1. Left ventricular ejection fraction, by estimation, is 35 to 40%. The  left ventricle has moderately decreased function. The left ventricle  demonstrates regional wall motion abnormalities. Abnormal (paradoxical)  septal motion, consistent with RV  pacemaker     The left ventricular internal cavity size was moderately dilated.  There is mild left ventricular hypertrophy. Left ventricular diastolic  parameters are consistent with Grade I diastolic dysfunction (impaired  relaxation).   2. Right ventricular systolic function is normal. The right ventricular  size is normal.   3. The mitral valve is normal in structure. No evidence of mitral valve  regurgitation. No evidence of mitral stenosis.   4. The aortic valve was not well visualized. Aortic valve regurgitation  is not visualized. No aortic stenosis is present.   5. The inferior vena cava is normal in size with greater than 50%  respiratory variability, suggesting right atrial pressure  of 3 mmHg.   Laboratory Data:  High Sensitivity Troponin:   Recent Labs  Lab 04/09/23 1212 04/09/23 1402 04/22/23 1840 04/22/23 2211  TROPONINIHS 10 8 11 15      Chemistry Recent Labs  Lab 04/19/23 1534 04/22/23 1840 04/22/23 2211 04/23/23 0710  NA 137 129*  --  131*  K 3.9 3.8  --  4.0  CL 94* 90*  --  95*  CO2 30 26  --  25  GLUCOSE 164* 162*  --  164*  BUN 20 19  --  14  CREATININE 0.81 1.11*  --  0.85  CALCIUM 10.5* 9.0  --  8.6*  MG 1.7  --  1.9 2.0  GFRNONAA >60 52*  --  >60  ANIONGAP 13 13  --  11    Recent Labs  Lab 04/22/23 1840 04/23/23 0710  PROT 6.4* 5.9*  ALBUMIN 2.9* 2.6*  AST 28 19  ALT 21 19  ALKPHOS 57 44  BILITOT 1.2* 1.1   Lipids No results for input(s): "CHOL", "TRIG", "HDL", "LABVLDL", "LDLCALC", "CHOLHDL" in the last 168 hours.  Hematology Recent Labs  Lab 04/22/23 1840 04/23/23 0710  WBC 11.9* 9.9  RBC 4.45 3.86*  HGB 11.6* 10.0*  HCT 36.2 31.8*  MCV 81.3 82.4  MCH 26.1 25.9*  MCHC 32.0 31.4  RDW 15.4 15.6*  PLT 246 202   Thyroid No results for input(s): "TSH", "FREET4" in the  last 168 hours.  BNP Recent Labs  Lab 04/22/23 1840  BNP 82.7    DDimer No results for input(s): "DDIMER" in the last 168 hours.   Radiology/Studies:  DG Chest Port 1 View  Result Date: 04/22/2023 CLINICAL DATA:  Dark stool, bowel perforation EXAM: PORTABLE CHEST 1 VIEW COMPARISON:  CT 04/22/2023,, chest x-ray 04/09/2023 FINDINGS: Left-sided multi lead pacing device as before. Enlarged cardiomediastinal silhouette with vascular congestion. Aortic atherosclerosis. Minimal atelectasis or scarring at left base. No pleural effusion. IMPRESSION: Enlarged cardiomediastinal silhouette with mild vascular congestion. Electronically Signed   By: Jasmine Pang M.D.   On: 04/22/2023 22:01   CT ABDOMEN PELVIS W CONTRAST  Result Date: 04/22/2023 CLINICAL DATA:  Sigmoid diverticulitis. Colovaginal fistula. Chronic constipation. EXAM: CT ABDOMEN AND PELVIS WITH  CONTRAST TECHNIQUE: Multidetector CT imaging of the abdomen and pelvis was performed using the standard protocol following bolus administration of intravenous contrast. RADIATION DOSE REDUCTION: This exam was performed according to the departmental dose-optimization program which includes automated exposure control, adjustment of the mA and/or kV according to patient size and/or use of iterative reconstruction technique. CONTRAST:  OMNIPAQUE IOHEXOL 300 MG/ML  SOLN COMPARISON:  04/09/2023 FINDINGS: Lower Chest: No acute findings. Hepatobiliary: No suspicious hepatic masses identified. Prior cholecystectomy. No evidence of biliary obstruction. Pancreas:  No mass or inflammatory changes. Spleen: Within normal limits in size and appearance. Adrenals/Urinary Tract: No suspicious masses identified. No evidence of ureteral calculi or hydronephrosis. Stomach/Bowel: Moderate sigmoid diverticulitis has increased since previous study. New focal inflammatory collection with internal gas bubbles seen in the central sigmoid mesocolon measuring 4.1 x 3.0 cm, consistent with localized perforation. No No evidence of free intraperitoneal air. A new rim enhancing fluid collection is seen in the right perirectal region measuring 5.6 x 4.0 cm, consistent with abscess. A new rim enhancing fluid collection is also seen in the anterior left lower quadrant which measures 6.0 x 4.1 cm, also consistent with abscess. A small amount of gas is again seen in the vagina, suspicious for colovaginal fistula. Increased mild reactive wall thickening of the adjacent urinary bladder also seen. Vascular/Lymphatic: No pathologically enlarged lymph nodes. No acute vascular findings. Reproductive:  Prior hysterectomy. Other:  None. Musculoskeletal:  No suspicious bone lesions identified. IMPRESSION: Worsening sigmoid diverticulitis, with new 4.1 cm focal inflammatory collection with gas bubbles in the central sigmoid mesocolon, consistent with  localized perforation. New 5.6 cm right perirectal abscess. New 6.0 cm left lower quadrant abscess. Persistent small amount of gas in the vagina, suspicious for colovaginal fistula. Electronically Signed   By: Danae Orleans M.D.   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  RCRI 10.1 % 30-day risk of death, MI, or cardiac arrest.  Likely cannot complete greater than 4 METS, but functionally independent.  Currently she has been getting IV fluids and looks like she has tipped over a little bit and mildy SOB.  BP appears to be soft today, not sure if this is due to inaccurate measurements, or more concerning evolving sepsis despite normal labs.  Would be more judicious about IV fluids, consider decreasing.  May actually need some diuretics. Will give one dose of IV lasix 60mg  now, assess tomorrow need for ongoing diuresis, but will put in an order  for IV lasix 40mg  tomorrow for 1 dose. Will give of potassium now, reassess tomorrow if need for ongoing supplementation/diuretics.  Will also order BNP for tomorrow.   Chronic HFrEF 35-40% Nonischemic cardiomyopathy Normal coronaries as above.  April echo shows EF 35 to 40% normal RV function.  Slightly volume up.  BP is soft today.   Agree with holding spironolactone.  Did not tolerate Entresto due to low BP.  Titrate GDMT once more stable.  Atrial fibrillation status post AV nodal ablation Medtronic PPM Intermittent A/V paced.  QTc 410 on 11/14.  Monitor QTc.  Maintain electrolyte balance potassium greater than 4, magnesium greater than 2.  Will need close monitoring for postoperative A-fib. Continue Tikosyn, Lovenox for now, transition to Xarelto when able.  COPD chronically  on 4 L OSA on CPAP BOOP with tracheobronchomalacia Anemia  Risk Assessment/Risk Scores:   New York Heart Association (NYHA) Functional Class NYHA Class III  CHA2DS2-VASc Score = 7  } This indicates a 11.2% annual risk of stroke. The patient's score is based upon: CHF History: 1 HTN History: 1 Diabetes History: 1 Stroke History: 0 Vascular Disease History: 1 Age Score: 2 Gender Score: 1    For questions or updates, please contact [facility] Please consult [email or web address] for contact info under    Signed, Abagail Kitchens, PA-C  04/23/2023 2:14 PM    ATTENDING ATTESTATION  I have seen, examined and evaluated the patient this afternoon along with Yvonna Alanis, PA.  After reviewing all the available data and chart, we discussed the patients laboratory, study & physical findings as well as symptoms in detail.  I agree with his findings, examination as well as impression recommendations as per our discussion.    Attending adjustments noted in italics.   Very complicated chronically ill patient with chronic combined systolic and diastolic heart failure with nonischemic cardiomyopathy, longstanding essentially permanent A-fib on Tikosyn post ablation with PPM placed (has bundle lead due to failed BiV pacing).  From a noncardiac standpoint she has chronic COPD and supple through full liter oxygen as well as OSA on CPAP and Boop as well as tracheal bronchial malacia who was admitted with acute diverticulitis and colovesical fistula with sepsis.  We are being asked to see for preop assessment.  Currently she is receiving IV fluids and has not been receiving her diuretic, therefore she is starting to show signs of volume overload with elevated JVD and abdominal distention and dyspnea.  => To begin with, we will treat with IV Lasix in place of her p.o. torsemide-will start with 60 mg IV Lasix today and at least 40 mg IV daily starting tomorrow and will allow my team over the weekend  to reassess.  Her heart failure symptoms would need to be improved in order to optimize her for upcoming surgery.  Preop surgical risk is significant elevated because of her chronic and somewhat active heart failure symptoms but exacerbated by intra-abdominal surgery from a cardiovascular standpoint, then there is also the concern with extensive COPD, OSA-Boop and tracheobronchial malacia which would all make her very complex from her postop management standpoint.  She will definitely need ICU care postoperatively.  She is on Tikosyn for A-fib and will need to ensure that her potassium and magnesium are maintained and stable levels K >4 and magnesium >2.  No additional studies are needed for preop assessment as her pacemaker appears to be working relatively well with bundle branch pacing (QRS is not significantly widened). She has  not tolerated afterload reduction with Entresto as well as spironolactone.  Not on beta-blocker, but is on Tikosyn.  Will continue to follow and help direct CHF management as she seems to be NYHA class III symptoms at this point. She is on Xarelto for her A-fib which is being held and should be on IV heparin (or could consider Lovenox treatment dose per pharmacy)       Marykay Lex, MD, MS Bryan Lemma, M.D., M.S. Interventional Cardiologist  [facility]  Pager # [phone] Phone # [phone] [address] [place], [place] [address]

## 2023-04-23 NOTE — Progress Notes (Signed)
Pt arrived to unit, assessed, informed of POC

## 2023-04-23 NOTE — Consult Note (Signed)
Consultation Note Date: 04/23/2023 at 1400  Patient Name: Kathleen [name]  DOB: 1945-07-01  MRN: [id number]  Age / Sex: 77 y.o., female  PCP: [name], MD Referring Physician: Glade Lloyd, MD  HPI/Patient Profile: 77 y.o. female  with past medical history of colovaginal fistula associated with diverticulitis diagnosed 9/16 on multiple rounds of antibiotics, obesity, OSA on BiPAP, BOOP, tracheobronchomalacia, chronic respiratory failure on 4 to 5 L oxygen at baseline, persistent A-fib S/P AVN ablation and pacemaker implant 2017 failed BIV upgrade, maintained on Xarelto, chronic systolic heart failure, recent admission from 04/09/2023 to 04/13/2023 for weakness  admitted on 04/22/2023 with persistent diverticulitis with abnormal CT scan (outpatient).  CT of abdomen and pelvis revealed worsening diverticulitis with thickening of bowels, localized perforation, and perirectal abscess.  Patient is being treated with IV antibiotics, antiemetics, pain medication.  General surgery and GI were consulted.  PMT was consulted to discuss boundaries and goals of care.   Clinical Assessment and Goals of Care: Extensive chart review completed prior to meeting patient including labs, vital signs, imaging, progress notes, orders, and available advanced directive documents from current and previous encounters. I then met with patient, her 2 sons, her brother, and her brothers wife at bedside to discuss diagnosis prognosis, GOC, EOL wishes, disposition and options.  I introduced Palliative Medicine as specialized medical care for people living with serious illness. It focuses on providing relief from the symptoms and stress of a serious illness. The goal is to improve quality of life for both the patient and the family.  During my visit both cardiology and GI visited/discussed medical status with patient.  We discussed a  brief life review of the patient.  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  As far as functional and nutritional status patient is independent with all ADLs but is having increased abdominal pain most recently.  She denies issues with p.o. intake prior to most recent N/V related to diverticular flareup. \ We discussed patient's current illness and what it means in the larger context of patient's on-going co-morbidities.    I attempted to elicit values and goals of care important to the patient.  Patient shares that she and her family have walked through difficult medical situations previously and are familiar with boundaries and goals of care.  She endorses that she would never want to be placed on a ventilator be maintained artificially by machines.  She endorses DNR with limited interventions.  CODE STATUS changed to reflect her wishes.  She shares that her daughter is her HCPOA but that all of her family knows her wishes.  She has created a living will outlining her wishes.  I have requested a copy to be brought to the hospital for review and placed into her electronic chart.  Discussed patient's pain.  Pain assessment completed.  Oxy IR adjusted as well as antiemetics.  Opportunity and space provided for patient and family to ask  questions.  Patient remains in agreement with adjustment to medications.  Plan is for surgery on Monday.  No acute palliative needs at this time.  Patient made aware that PMT is available to her throughout her hospitalization.  We will shadow her chart and monitor her peripherally.  Please reengage with PMT at patient/family's request, if goals change, or patient's health deteriorates during hospitalization.  Goals are clear.  Symptoms appear well-managed with current regimen.  Please reach out to PMT for any acute issues over the weekend utilizing [employer] for on-call providers.   Otherwise, a provider will follow-up with patient and family postop.  Primary Decision Maker PATIENT  Physical Exam Vitals reviewed.  Constitutional:      General: She is not in acute distress.    Appearance: She is obese.  Cardiovascular:     Rate and Rhythm: Normal rate.     Heart sounds: Normal heart sounds.  Abdominal:     Palpations: Abdomen is soft.     Tenderness: There is abdominal tenderness in the right lower quadrant.  Skin:    General: Skin is warm and dry.  Neurological:     Mental Status: She is alert and oriented to person, place, and time.  Psychiatric:        Mood and Affect: Mood normal. Mood is not anxious or depressed.        Behavior: Behavior normal.     Palliative Assessment/Data: 60%     Thank you for this consult. Palliative medicine will continue to follow and assist holistically.   Time Total: 75 minutes  Time spent includes: Detailed review of medical records (labs, imaging, vital signs), medically appropriate exam (mental status, respiratory, cardiac, skin), discussed with treatment team, counseling and educating patient, family and staff, documenting clinical information, medication management and coordination of care.  Signed by: Georgiann Cocker, DNP, FNP-BC Palliative Medicine   Please contact Palliative Medicine Team providers via [facility] for questions and concerns.

## 2023-04-23 NOTE — Progress Notes (Signed)
I have reviewed and concur with this student's documentation.   Reva Bores, RN 04/23/2023 6:02 PM

## 2023-04-23 NOTE — Consult Note (Signed)
Consultation Note   Referring Provider:  [facility] PCP: [name], MD Primary Gastroenterologist: Corliss Parish, MD        Reason for Consultation: Diverticulitis.   DOA: 04/22/2023         Hospital Day: 2   ASSESSMENT    Brief Narrative:  77 y.o. year old female with OSA on CPAP, BOOP, COPD on home 02, CHFpEF, AFIB s/p ablation and PPM, HTN, CKD, gout, colon polyps , [facility] of pancreatic cancer in mother and iron deficiency.   Persistent / recurrent sigmoid diverticulitis with colovaginal fistula and abscess.  Has had several admissions / antibiotics since September. Has been unable to get outpatient pre-op colonoscopy due to persistent diverticulitis.   Persisent Afib Home Xarelto on hold. Getting Lovenox in patient   Chronic systolic heart failure.  Compensated  Chronic respiratory failure with hypoxia COPD  BOOP   Principal Problem:   Diverticulitis of colon with perforation Active Problems:   Chronic systolic heart failure (HCC)   COPD (chronic obstructive pulmonary disease) (HCC)   Persistent atrial fibrillation (HCC)   Type 2 diabetes mellitus with diabetic neuropathy, without long-term current use of insulin (HCC)   Chronic anticoagulation   Acquired hypothyroidism   Colovaginal fistula   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HCC)     PLAN:   --Continue Zosyn --NPO, IV hydration --General Surgery is following.  Patient has significant co-morbidities but given failure to improve Surgery may be moving forward with colon resection next week. They are requesting pre-op colonoscopy  --Dr. Tomasa Rand will see patient later today.    HPI   Brief GI history:  Kathleen [name] established care with [place] in 2023 for evaluation of iron deficiency, history of colon polyps on CT colonography in 2021 and dysphagia.. Given significant lung disease we held off on endoscopic evaluation but did get a  barium swallow which was normal.  She was hospitalized in Sept 2024 for acute diverticulitis with colovesical fistula and also persistent IDA. [name] Since September she has had several admissions for persistent sigmoid diverticulitis  / colovesical fistula.  She has continued to follow up in our office after hospitalizations. We have been trying to get EGD / colonoscopy done to evaluate symptoms and IDA but diverticulitis hasn't really improved and additionally we were waiting on pulmonary and cardiac clearance. In the meantime she was readmitted yesterday with fever and worsening diverticulitis with perforation and right perirectal and LLQ abscess as well as probable persistent colovaginal fistula.    Notable labs / Imaging / Events this admission  :   NA 131 Alb 2.6 Lactic acid 0.8 WBC 9.9 Hgb 10, MCV 82 Blood cx negative < 12 hours  CT AP with contrast Worsening sigmoid diverticulitis, with new 4.1 cm focal inflammatory collection with gas bubbles in the central sigmoid mesocolon, consistent with localized perforation.  New 5.6 cm right perirectal abscess.  New 6.0 cm left lower quadrant abscess.  Persistent small amount of gas in the vagina, suspicious for colovaginal fistula  Labs and Imaging: Recent Labs    04/22/23 1840 04/23/23 0710  WBC 11.9* 9.9  HGB 11.6* 10.0*  HCT 36.2 31.8*  PLT 246 202   Recent Labs    04/22/23 1840 04/23/23 0710  NA 129* 131*  K 3.8 4.0  CL 90* 95*  CO2 26 25  GLUCOSE 162* 164*  BUN 19 14  CREATININE 1.11* 0.85  CALCIUM 9.0 8.6*   Recent Labs    04/23/23 0710  PROT 5.9*  ALBUMIN 2.6*  AST 19  ALT 19  ALKPHOS 44  BILITOT 1.1   No results for input(s): "HEPBSAG", "HCVAB", "HEPAIGM", "HEPBIGM" in the last 72 hours. 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    Concussion 10/04/2021   COPD (chronic obstructive pulmonary disease) (HCC)    Diverticulitis    Gout    Hypertension    Longstanding persistent atrial fibrillation (HCC)    on Xarelto   Nonischemic cardiomyopathy (HCC)    Obesity    Pacemaker    Spontaneous pneumothorax 2013    Past Surgical History:  Procedure Laterality Date   ABDOMINAL HYSTERECTOMY     APPENDECTOMY     ATRIAL FIBRILLATION ABLATION  07/20/2013   by Dr Christin Fudge   AV nodal ablation  11/01/2013   by Dr Christin Fudge, repeated by Dr Wilford Grist   BREAST BIOPSY Bilateral 1997   negative   CARDIAC CATHETERIZATION     CHOLECYSTECTOMY     HERNIA REPAIR     PACEMAKER INSERTION  06/2017   MDT Viva CRT-P implanted by Dr Christin Fudge after AV nodal ablation,  LV lead could not be placed   PACEMAKER INSERTION  05/2020   with lead bundle   RIGHT/LEFT HEART CATH AND CORONARY ANGIOGRAPHY N/A 03/20/2022   Procedure: RIGHT/LEFT HEART CATH AND CORONARY ANGIOGRAPHY;  Surgeon: [place], Peter M, MD;  Location: [facility] INVASIVE CV LAB;  Service: Cardiovascular;  Laterality: N/A;    Family History  Problem Relation Age of Onset   Breast cancer Mother 44       3 different times   Pancreatic cancer Mother    Emphysema Father    Healthy Brother    Breast cancer Cousin    Valvular heart disease Son    Obesity Daughter    Colon cancer Neg Hx    Esophageal cancer Neg Hx    Stomach cancer Neg Hx    Inflammatory bowel disease Neg Hx    Liver disease Neg Hx    Rectal cancer Neg Hx     Prior to Admission medications   Medication Sig Start Date End Date Taking? Authorizing Provider  acetaminophen (TYLENOL) 500 MG tablet Take 1,000 mg by mouth as needed.   Yes [provider]  amoxicillin-clavulanate (AUGMENTIN) 875-125 MG tablet Take 1 tablet by mouth every 12 (twelve) hours. 04/13/23  Yes Danford, Earl Lites, MD  Cholecalciferol (VITAMIN D3) 50 MCG (2000  UT) TABS Take 2,000 Units by mouth at bedtime.   Yes [provider]  cyanocobalamin (VITAMIN B12) 1000 MCG/ML injection Inject 1 mL (1,000 mcg total) into the muscle every 30 (thirty) days. 05/05/22  Yes [name], MD  docusate sodium (COLACE) 100 MG capsule Take 100 mg by mouth daily.   Yes [provider]  dofetilide (TIKOSYN) 250 MCG capsule Take 1 capsule (250 mcg total) by mouth in the morning and at bedtime. 12/01/22 12/01/23 Yes Fenton, Clint R, PA  febuxostat (ULORIC) 40 MG tablet TAKE 1 TABLET(40 MG) BY MOUTH DAILY Patient taking differently: Take 40 mg by mouth at bedtime. 04/05/23  Yes Rice, Jamesetta Orleans, MD  fluticasone furoate-vilanterol (BREO ELLIPTA) 100-25 MCG/ACT AEPB INHALE 1 PUFF INTO THE LUNGS DAILY 03/23/23  Yes Oretha Milch, MD  gabapentin (NEURONTIN) 100 MG capsule Take 2 capsules (200 mg total) by mouth at bedtime. 11/03/22 06/01/23 Yes Camara, Amalia Hailey, MD  levalbuterol (XOPENEX) 0.63 MG/3ML nebulizer solution Take 3 mLs (0.63 mg total) by nebulization every 6 (six) hours as needed for wheezing or shortness of breath. DX J44.89 J96.21 01/20/23  Yes Oretha Milch, MD  Magnesium 500 MG TABS Take 500 mg by mouth 2 (two) times daily.   Yes [provider]  nystatin (MYCOSTATIN) 100000 UNIT/ML suspension Take 5 mLs (500,000 Units total) by mouth 4 (four) times daily. 04/04/23  Yes Almon Hercules, MD  OXYGEN Inhale 4 L into the lungs continuous. Use with resmed ventilator   Yes [provider]  pantoprazole (PROTONIX) 40 MG tablet Take 1 tablet (40 mg total) by mouth daily. 04/05/23  Yes Gonfa, Boyce Medici, MD  Plecanatide (TRULANCE) 3 MG TABS Take 1 tablet (3 mg total) by mouth daily. Patient taking differently: Take 3 mg by mouth 3 (three) times a week. 04/06/23  Yes Mansouraty, Netty Starring., MD  potassium chloride (KLOR-CON) 10 MEQ tablet Take 40 mEq by mouth daily.   Yes [provider]  revefenacin (YUPELRI) 175 MCG/3ML nebulizer  solution Take 175 mcg by nebulization daily.   Yes [provider]  rivaroxaban (XARELTO) 20 MG TABS tablet Take 1 tablet (20 mg total) by mouth daily with supper. 02/01/23  Yes Bensimhon, [name], MD  spironolactone (ALDACTONE) 25 MG tablet Take 1 tablet (25 mg total) by mouth 2 (two) times daily. 04/13/23  Yes Danford, Earl Lites, MD  torsemide (DEMADEX) 20 MG tablet Take 2 tablets (40 mg total) by mouth daily. 04/14/23  Yes Danford, Earl Lites, MD  Zinc Oxide (DESITIN MAXIMUM STRENGTH) 40 % PSTE Apply 1 Application topically 4 (four) times daily. Mix with aquaphor and apply to inflamed area   Yes [provider]    Current Facility-Administered Medications  Medication Dose Route Frequency Provider Last Rate Last Admin   0.9 % NaCl with KCl 20 mEq/ L  infusion   Intravenous Continuous Joneen Roach, Debby, MD 50 mL/hr at 04/22/23 2248 New Bag at 04/22/23 2248   acetaminophen (TYLENOL) tablet 650 mg  650 mg Oral Q6H PRN Gery Pray, MD       Or   acetaminophen (TYLENOL) suppository 650 mg  650 mg Rectal Q6H PRN Crosley, Debby, MD       acetaminophen (TYLENOL) tablet 650 mg  650 mg Oral Once Schuman, James T, PA-C       albuterol (PROVENTIL) (2.5 MG/3ML) 0.083% nebulizer solution 2.5 mg  2.5 mg Nebulization Q2H PRN Crosley, Debby, MD       docusate sodium (COLACE) capsule 100 mg  100 mg Oral Daily Crosley, Debby, MD   100 mg at 04/23/23 1030   dofetilide (TIKOSYN) capsule 250 mcg  250 mcg Oral Q12H Crosley, Debby, MD   250 mcg at 04/23/23 1030   enoxaparin (LOVENOX) injection 80 mg  80 mg Subcutaneous BID Alekh, Kshitiz, MD   80 mg at 04/23/23 1030   fluticasone furoate-vilanterol (BREO ELLIPTA) 100-25 MCG/ACT 1 puff  1 puff Inhalation Daily Crosley, Debby, MD   1 puff at 04/23/23 0830   gabapentin (  NEURONTIN) capsule 200 mg  200 mg Oral QHS Crosley, Debby, MD   200 mg at 04/22/23 2225   HYDROmorphone (DILAUDID) injection 0.5 mg  0.5 mg Intravenous Q3H PRN Crosley, Debby, MD        oxyCODONE (Oxy IR/ROXICODONE) immediate release tablet 5 mg  5 mg Oral Q4H PRN Crosley, Debby, MD   5 mg at 04/23/23 0822   pantoprazole (PROTONIX) EC tablet 40 mg  40 mg Oral Daily Crosley, Debby, MD   40 mg at 04/23/23 1031   piperacillin-tazobactam (ZOSYN) IVPB 3.375 g  3.375 g Intravenous Q8H Crosley, Debby, MD 12.5 mL/hr at 04/23/23 0559 3.375 g at 04/23/23 0559   promethazine (PHENERGAN) 12.5 mg in sodium chloride 0.9 % 50 mL IVPB  12.5 mg Intravenous Q6H PRN Crosley, Debby, MD       revefenacin (YUPELRI) nebulizer solution 175 mcg  175 mcg Nebulization Daily Gery Pray, MD   175 mcg at 04/23/23 8119    Allergies as of 04/22/2023 - Review Complete 04/22/2023  Allergen Reaction Noted   Allopurinol Other (See Comments) 09/08/2015   Clindamycin Anaphylaxis and Hives 10/29/2014   Flublok [influenza vaccine recombinant] Other (See Comments) 03/31/2022   Pneumococcal 13-val conj vacc Itching, Swelling, and Rash 11/21/2014   Dronedarone Rash 10/29/2014   Montelukast Other (See Comments) 02/12/2023   Brovana [arformoterol]  08/21/2021   Budesonide  08/21/2021   Entresto [sacubitril-valsartan] Other (See Comments) 06/07/2018   Fosamax [alendronate sodium] Nausea Only 01/25/2019   Jardiance [empagliflozin]  08/21/2021   Meperidine Nausea And Vomiting 10/29/2014   Microplegia msa-msg [plegisol]  10/04/2021   Rosuvastatin Other (See Comments) 10/29/2014   Tetracycline Hives 10/29/2014   Adhesive [tape] Rash 09/17/2015   Lovastatin Rash and Other (See Comments) 05/23/2013    Social History   Socioeconomic History   Marital status: Widowed    Spouse name: Not on file   Number of children: 1   Years of education: Not on file   Highest education level: Not on file  Occupational History   Occupation: retired  Tobacco Use   Smoking status: Former    Current packs/day: 0.00    Average packs/day: 1 pack/day for 20.0 years (20.0 ttl pk-yrs)    Types: Cigarettes    Start date:  06/09/1967    Quit date: 06/09/1987    Years since quitting: 35.8    Passive exposure: Past   Smokeless tobacco: Never   Tobacco comments:    Former smoker 12/25/21  Vaping Use   Vaping status: Never Used  Substance and Sexual Activity   Alcohol use: No    Alcohol/week: 0.0 standard drinks of alcohol   Drug use: No   Sexual activity: Not Currently  Other Topics Concern   Not on file  Social History Narrative   Pt lives in [place] alone.  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  Her son works in [employer].   Social Determinants of Health   Financial Resource Strain: Low Risk  (05/21/2022)   Overall Financial Resource Strain (CARDIA)    Difficulty of Paying Living Expenses: Not very hard  Food Insecurity: No Food Insecurity (04/22/2023)   Hunger Vital Sign    Worried About Running Out of Food in the Last Year: Never true    Ran Out of Food in the Last Year: Never true  Transportation Needs: No Transportation Needs (04/22/2023)   PRAPARE - Administrator, Civil Service (Medical): No    Lack of Transportation (  Non-Medical): No  Physical Activity: Insufficiently Active (05/21/2022)   Exercise Vital Sign    Days of Exercise per Week: 2 days    Minutes of Exercise per Session: 20 min  Stress: No Stress Concern Present (05/21/2022)   [employer] of Occupational Health - Occupational Stress Questionnaire    Feeling of Stress : Only a little  Social Connections: Moderately Isolated (05/21/2022)   Social Connection and Isolation Panel [NHANES]    Frequency of Communication with Friends and Family: More than three times a week    Frequency of Social Gatherings with Friends and Family: More than three times a week    Attends Religious Services: Never    Database administrator or Organizations: Yes    Attends Engineer, structural: More than 4 times per year    Marital Status: Widowed  Intimate Partner Violence: Not At Risk (04/22/2023)    Humiliation, Afraid, Rape, and Kick questionnaire    Fear of Current or Ex-Partner: No    Emotionally Abused: No    Physically Abused: No    Sexually Abused: No     Code Status   Code Status: Do not attempt resuscitation (DNR) - Comfort care  Review of Systems: All systems reviewed and negative except where noted in HPI.  Physical Exam: Vital signs in last 24 hours: Temp:  [98.1 F (36.7 C)-101.8 F (38.8 C)] 98.1 F (36.7 C) (11/15 0803) Pulse Rate:  [71-92] 72 (11/15 0830) Resp:  [14-22] 18 (11/15 0830) BP: (92-125)/(56-99) 92/57 (11/15 0803) SpO2:  [99 %-100 %] 100 % (11/15 0830) FiO2 (%):  [36 %] 36 % (11/15 0830) Weight:  [79.4 kg] 79.4 kg (11/14 1829) Last BM Date : 04/22/23 (pta)  General:  Pleasant female in NAD Psych:  Cooperative. Normal mood and affect Eyes: Pupils equal Ears:  Normal auditory acuity Nose: No deformity, discharge or lesions Neck:  Supple, no masses felt Lungs:  Clear to auscultation.  Heart:  Regular rate, regular rhythm.  Abdomen:  Soft, protuberant, mild generalized lower abdominal tenderness. Hypoactive bowel soundst Rectal :  Deferred Msk: Symmetrical without gross deformities.  Neurologic:  Alert, oriented, grossly normal neurologically Extremities : No edema Skin:  Intact without significant lesions.    Intake/Output from previous day: No intake/output data recorded. Intake/Output this shift:  No intake/output data recorded.   Willette Cluster, NP-C   04/23/2023, 11:09 AM

## 2023-04-23 NOTE — Progress Notes (Addendum)
Initial Nutrition Assessment  DOCUMENTATION CODES:   Non-severe (moderate) malnutrition in context of chronic illness  INTERVENTION:  - Provide Ensure Ensure BID, each supplement provides 350 kcal and 20 grams of protein.  -Provide Magic cup TID, Magic cup TID with meals, each supplement provides 290 kcal and 9 grams of protein cup  - Provide MVI with minerals daily  -Encourage good PO intake  NUTRITION DIAGNOSIS:   Moderate Malnutrition related to chronic illness as evidenced by mild fat depletion, mild muscle depletion, energy intake < 75% for > 7 days.   GOAL:   Patient will meet greater than or equal to 90% of their needs   MONITOR:   PO intake, Supplement acceptance, Diet advancement, Labs, Weight trends  REASON FOR ASSESSMENT:   Consult Assessment of nutrition requirement/status  ASSESSMENT:  77 y.o. F, admitted with PMH of hypothyroidism, COPD,OSA (on BiPAP), BOOP (noncandidate for stenting), diverticulitis, CHF, obesity, HTN, gout, and colovesical fistula. Presented 11/14 for worsening diverticulitis with new bowel perforation.  Pt had had multiple admissions  to the hospital in the last year due to persistent symptoms from her sigmoid diverticulitis. Unfortunately has failed all non operative measures. Pt high risk for surgery due to CHF. Surgery recommends a colonoscopy and then a colectomy and colostomy.   Family in room on visit, pt awake. Pt states having poor PO intake in the last 2 months. With only 2 meals/day consisting of soft foods and occasionally 1 Boost per day. Pt reports having no appetite states she has not had a real meal since 11/5. She also reports having high fistula output but has now decreased due to decreased PO intake. Pt may be intentionally avoiding food to keep her fistula output low?   Pt's body weight fluctuates due to fluid. She states she usually weighs around 188 lbs. Reviewed weight history in chart and weight has fluctuated between  85-88 kg in the last 6 months, however it seems pt's wt is trending down despite fluid retention. Since September pt has lost 16 lbs, but do not know how much of that is fluid?  Pt just advanced to soft today, will add Ensure Enlive BID and Magic Cup TID.    Admit weight: 79.4 kg Current weight: 79.4 kg   04/22/23 79.4 kg  04/19/23 83.1 kg  04/12/23 80.9 kg  04/08/23 80.7 kg  04/07/23 81.9 kg  04/06/23 81.6 kg  04/04/23 85.3 kg  03/17/23 86.8 kg  03/05/23 86.8 kg  02/12/23 86.9 kg   Nutritionally Relevant Medications: Scheduled Meds:  docusate sodium  100 mg Oral Daily   dofetilide  250 mcg Oral Q12H   gabapentin  200 mg Oral QHS   pantoprazole  40 mg Oral Daily   Continuous Infusions:  0.9 % NaCl with KCl 20 mEq / L 50 mL/hr at 04/22/23 2248   Labs Reviewed: Sodium 131 (L), Calcium 8.6 (L) CBG ranges from 136-136 mg/dL over the last 24 hours HgbA1c 5.5 (02/22/2023)  NUTRITION - FOCUSED PHYSICAL EXAM: Flowsheet Row Most Recent Value  Orbital Region Mild depletion  Upper Arm Region Mild depletion  Thoracic and Lumbar Region Unable to assess  [Fluid]  Buccal Region No depletion  Temple Region Moderate depletion  Clavicle Bone Region Unable to assess  [Fluid]  Clavicle and Acromion Bone Region Mild depletion  Scapular Bone Region Mild depletion  Dorsal Hand Mild depletion  Patellar Region Unable to assess  [Fluid]  Anterior Thigh Region Unable to assess  [Fluid]  Posterior Calf Region Unable  to assess  [Fluid]  Edema (RD Assessment) Severe  [Abdomen and leg edema]  Hair Reviewed  Eyes Reviewed  Mouth Reviewed  Skin Reviewed  Nails Reviewed       Diet Order:   Diet Order             DIET SOFT Room service appropriate? Yes; Fluid consistency: Thin  Diet effective now                   EDUCATION NEEDS:   Education needs have been addressed  Skin:  Skin Assessment: Skin Integrity Issues: Skin Integrity Issues:: Other (Comment) Other: Edema in  abdomen/chest and legs  Last BM:  11/14: TYPE 5  Height:   Ht Readings from Last 1 Encounters:  04/22/23 5' (1.524 m)    Weight:   Wt Readings from Last 1 Encounters:  04/22/23 79.4 kg    Ideal Body Weight:  45.45 kg  BMI:  Body mass index is 34.18 kg/m.  Estimated Nutritional Needs:   Kcal:  1600-1800 kcal  Protein:  75-95 gm  Fluid:  >1.6L   Elliot Dally, RD Registered Dietitian  See Amion for more information

## 2023-04-24 DIAGNOSIS — I5043 Acute on chronic combined systolic (congestive) and diastolic (congestive) heart failure: Secondary | ICD-10-CM

## 2023-04-24 DIAGNOSIS — I4891 Unspecified atrial fibrillation: Secondary | ICD-10-CM | POA: Diagnosis not present

## 2023-04-24 DIAGNOSIS — K5720 Diverticulitis of large intestine with perforation and abscess without bleeding: Secondary | ICD-10-CM | POA: Diagnosis not present

## 2023-04-24 LAB — CBC WITH DIFFERENTIAL/PLATELET
Abs Immature Granulocytes: 0.1 10*3/uL — ABNORMAL HIGH (ref 0.00–0.07)
Basophils Absolute: 0.1 10*3/uL (ref 0.0–0.1)
Basophils Relative: 1 %
Eosinophils Absolute: 0.2 10*3/uL (ref 0.0–0.5)
Eosinophils Relative: 2 %
HCT: 32.1 % — ABNORMAL LOW (ref 36.0–46.0)
Hemoglobin: 10.3 g/dL — ABNORMAL LOW (ref 12.0–15.0)
Immature Granulocytes: 1 %
Lymphocytes Relative: 9 %
Lymphs Abs: 0.8 10*3/uL (ref 0.7–4.0)
MCH: 26.2 pg (ref 26.0–34.0)
MCHC: 32.1 g/dL (ref 30.0–36.0)
MCV: 81.7 fL (ref 80.0–100.0)
Monocytes Absolute: 0.7 10*3/uL (ref 0.1–1.0)
Monocytes Relative: 8 %
Neutro Abs: 7.3 10*3/uL (ref 1.7–7.7)
Neutrophils Relative %: 79 %
Platelets: 240 10*3/uL (ref 150–400)
RBC: 3.93 MIL/uL (ref 3.87–5.11)
RDW: 15.2 % (ref 11.5–15.5)
WBC: 9.2 10*3/uL (ref 4.0–10.5)
nRBC: 0 % (ref 0.0–0.2)

## 2023-04-24 LAB — COMPREHENSIVE METABOLIC PANEL
ALT: 20 U/L (ref 0–44)
AST: 19 U/L (ref 15–41)
Albumin: 2.6 g/dL — ABNORMAL LOW (ref 3.5–5.0)
Alkaline Phosphatase: 52 U/L (ref 38–126)
Anion gap: 11 (ref 5–15)
BUN: 12 mg/dL (ref 8–23)
CO2: 26 mmol/L (ref 22–32)
Calcium: 9.2 mg/dL (ref 8.9–10.3)
Chloride: 94 mmol/L — ABNORMAL LOW (ref 98–111)
Creatinine, Ser: 0.88 mg/dL (ref 0.44–1.00)
GFR, Estimated: >60 mL/min (ref >60)
Glucose, Bld: 153 mg/dL — ABNORMAL HIGH (ref 70–99)
Potassium: 3.2 mmol/L — ABNORMAL LOW (ref 3.5–5.1)
Sodium: 131 mmol/L — ABNORMAL LOW (ref 135–145)
Total Bilirubin: 0.8 mg/dL (ref <1.2)
Total Protein: 6.2 g/dL — ABNORMAL LOW (ref 6.5–8.1)

## 2023-04-24 LAB — MAGNESIUM: Magnesium: 2 mg/dL (ref 1.7–2.4)

## 2023-04-24 LAB — C-REACTIVE PROTEIN: CRP: 17.1 mg/dL — ABNORMAL HIGH (ref <1.0)

## 2023-04-24 LAB — BRAIN NATRIURETIC PEPTIDE: B Natriuretic Peptide: 96.3 pg/mL (ref 0.0–100.0)

## 2023-04-24 MED ORDER — POTASSIUM CHLORIDE CRYS ER 20 MEQ PO TBCR
40.0000 meq | EXTENDED_RELEASE_TABLET | ORAL | Status: AC
  Administered 2023-04-24 (×2): 40 meq via ORAL
  Filled 2023-04-24 (×2): qty 2

## 2023-04-24 MED ORDER — FUROSEMIDE 10 MG/ML IJ SOLN
60.0000 mg | Freq: Once | INTRAMUSCULAR | Status: AC
  Administered 2023-04-24: 60 mg via INTRAVENOUS
  Filled 2023-04-24: qty 6

## 2023-04-24 NOTE — Plan of Care (Signed)
  Problem: Education: Goal: Ability to describe self-care measures that may prevent or decrease complications (Diabetes Survival Skills Education) will improve Outcome: Progressing Goal: Individualized Educational Video(s) Outcome: Progressing   Problem: Fluid Volume: Goal: Ability to maintain a balanced intake and output will improve Outcome: Progressing   Problem: Nutritional: Goal: Maintenance of adequate nutrition will improve Outcome: Progressing Goal: Progress toward achieving an optimal weight will improve Outcome: Progressing

## 2023-04-24 NOTE — Plan of Care (Signed)

## 2023-04-24 NOTE — Progress Notes (Signed)
Subjective/Chief Complaint: Pt with no acute changes   Objective: Vital signs in last 24 hours: Temp:  [98.4 F (36.9 C)-101.1 F (38.4 C)] 98.6 F (37 C) (11/16 0429) Pulse Rate:  [71-77] 71 (11/16 0429) Resp:  [18-20] 20 (11/16 0429) BP: (111-132)/(46-53) 111/51 (11/16 0429) SpO2:  [99 %-100 %] 100 % (11/16 0429) FiO2 (%):  [36 %-40 %] 40 % (11/15 2259) Weight:  [82.7 kg] 82.7 kg (11/16 0429) Last BM Date : 04/23/23  Intake/Output from previous day: 11/15 0701 - 11/16 0700 In: 0  Out: 200 [Urine:200] Intake/Output this shift: No intake/output data recorded.  PE:  Constitutional: No acute distress, conversant, appears states age. Eyes: Anicteric sclerae, moist conjunctiva, no lid lag Lungs: Clear to auscultation bilaterally, normal respiratory effort CV: regular rate and rhythm, no murmurs, no peripheral edema, pedal pulses 2+ GI: Soft, no masses or hepatosplenomegaly, tender to palpation LLQ + RLQ Skin: No rashes, palpation reveals normal turgor Psychiatric: appropriate judgment and insight, oriented to person, place, and time   Lab Results:  Recent Labs    04/23/23 0710 04/24/23 0441  WBC 9.9 9.2  HGB 10.0* 10.3*  HCT 31.8* 32.1*  PLT 202 240   BMET Recent Labs    04/23/23 0710 04/24/23 0441  NA 131* 131*  K 4.0 3.2*  CL 95* 94*  CO2 25 26  GLUCOSE 164* 153*  BUN 14 12  CREATININE 0.85 0.88  CALCIUM 8.6* 9.2   PT/INR Recent Labs    04/22/23 1840  LABPROT 20.0*  INR 1.7*   ABG No results for input(s): "PHART", "HCO3" in the last 72 hours.  Invalid input(s): "PCO2", "PO2"  Studies/Results: DG Chest Port 1 View  Result Date: 04/22/2023 CLINICAL DATA:  Dark stool, bowel perforation EXAM: PORTABLE CHEST 1 VIEW COMPARISON:  CT 04/22/2023,, chest x-ray 04/09/2023 FINDINGS: Left-sided multi lead pacing device as before. Enlarged cardiomediastinal silhouette with vascular congestion. Aortic atherosclerosis. Minimal atelectasis or scarring at  left base. No pleural effusion. IMPRESSION: Enlarged cardiomediastinal silhouette with mild vascular congestion. Electronically Signed   By: Jasmine Pang M.D.   On: 04/22/2023 22:01   CT ABDOMEN PELVIS W CONTRAST  Result Date: 04/22/2023 CLINICAL DATA:  Sigmoid diverticulitis. Colovaginal fistula. Chronic constipation. EXAM: CT ABDOMEN AND PELVIS WITH CONTRAST TECHNIQUE: Multidetector CT imaging of the abdomen and pelvis was performed using the standard protocol following bolus administration of intravenous contrast. RADIATION DOSE REDUCTION: This exam was performed according to the departmental dose-optimization program which includes automated exposure control, adjustment of the mA and/or kV according to patient size and/or use of iterative reconstruction technique. CONTRAST:  OMNIPAQUE IOHEXOL 300 MG/ML  SOLN COMPARISON:  04/09/2023 FINDINGS: Lower Chest: No acute findings. Hepatobiliary: No suspicious hepatic masses identified. Prior cholecystectomy. No evidence of biliary obstruction. Pancreas:  No mass or inflammatory changes. Spleen: Within normal limits in size and appearance. Adrenals/Urinary Tract: No suspicious masses identified. No evidence of ureteral calculi or hydronephrosis. Stomach/Bowel: Moderate sigmoid diverticulitis has increased since previous study. New focal inflammatory collection with internal gas bubbles seen in the central sigmoid mesocolon measuring 4.1 x 3.0 cm, consistent with localized perforation. No No evidence of free intraperitoneal air. A new rim enhancing fluid collection is seen in the right perirectal region measuring 5.6 x 4.0 cm, consistent with abscess. A new rim enhancing fluid collection is also seen in the anterior left lower quadrant which measures 6.0 x 4.1 cm, also consistent with abscess. A small amount of gas is again seen in the vagina,  suspicious for colovaginal fistula. Increased mild reactive wall thickening of the adjacent urinary bladder also seen.  Vascular/Lymphatic: No pathologically enlarged lymph nodes. No acute vascular findings. Reproductive:  Prior hysterectomy. Other:  None. Musculoskeletal:  No suspicious bone lesions identified. IMPRESSION: Worsening sigmoid diverticulitis, with new 4.1 cm focal inflammatory collection with gas bubbles in the central sigmoid mesocolon, consistent with localized perforation. New 5.6 cm right perirectal abscess. New 6.0 cm left lower quadrant abscess. Persistent small amount of gas in the vagina, suspicious for colovaginal fistula. Electronically Signed   By: Danae Orleans M.D.   On: 04/22/2023 16:43    Anti-infectives: Anti-infectives (From admission, onward)    Start     Dose/Rate Route Frequency Ordered Stop   04/26/23 0800  cefoTEtan (CEFOTAN) 2 g in sodium chloride 0.9 % 100 mL IVPB        2 g 200 mL/hr over 30 Minutes Intravenous To ShortStay Surgical 04/23/23 2111 04/27/23 0800   04/22/23 2300  piperacillin-tazobactam (ZOSYN) IVPB 3.375 g        3.375 g 12.5 mL/hr over 240 Minutes Intravenous Every 8 hours 04/22/23 2256     04/22/23 1900  piperacillin-tazobactam (ZOSYN) IVPB 3.375 g        3.375 g 100 mL/hr over 30 Minutes Intravenous  Once 04/22/23 1852 04/22/23 1954       Assessment/Plan: 77 year old female with 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 She was to see Dr. Michaell Cowing as an outpatient but unfortunately keeps getting admitted to the hospital due to persistent symptoms. She has significant comorbidities and roughly a 10% risk of cardiac issues due to her congestive heart failure  -Plan on bowel prep Sunday slowly for plans for Surgery next week -GI not planning on preop c-scope after discussion with the patient.   ?On table c-scope -Con't abx -Cards has eval'd pt.  Appreciate their guidance  CHF HTN COPD on O2 Afib DM Hypothryoidism CV fistula    LOS: 2 days    Axel Filler 04/24/2023

## 2023-04-24 NOTE — Progress Notes (Signed)
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  She had an outpatient CT of abdomen and pelvis done as per Dr. Meridee Score which showed worsening diverticulitis with thickening of bowels, 1.2 cm localized perforation, 5.6 cm perirectal abscess.  On presentation, Tmax was 101.8, sodium was 129.  She was started on IV antibiotics.  General surgery was consulted.  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  Currently on IV Zosyn.  Follow cultures.  Diet advancement as per general surgery. -Tmax of 101.1 over the last 24 hours since  Leukocytosis -Resolved.  Monitor  Hyponatremia -Mild.  Monitor  Hypokalemia -Replace.  Repeat a.m. labs  Persistent A-fib -Xarelto on hold.  Currently on Lovenox.  Currently rate controlled.  Continue Tikosyn  Chronic systolic heart failure -Currently compensated.  Did not tolerate Entresto and beta-blocker due to low blood pressure.  Blood pressure on the lower side.  Cardiology following.  Diuretics as per cardiology.  Strict input and  output.  Daily weights.  Fluid restriction  Chronic respiratory failure with hypoxia COPD  BOOP -Normally wears 4 to 5 L oxygen via nasal cannula.  Continue current inhaled and nebulizer regimen  OSA -On BiPAP at night  Diabetes mellitus type 2 with diabetic neuropathy -Continue gabapentin.  Continue CBGs  Obesity -Outpatient follow-up  Physical deconditioning -PT eval once more stable  Goals of care -Palliative care evaluation appreciated.  CODE STATUS has been changed to DNR.  Overall prognosis is guarded to poor.  Moderate malnutrition -Follow nutrition recommendations   DVT prophylaxis: Lovenox  code Status: DNR Family Communication: Son at bedside on 04/23/23 Disposition Plan: Status is: Inpatient Remains inpatient appropriate because: Of severity of illness    Consultants: General surgery.  GI.  cardiology and palliative care  Procedures: None  Antimicrobials:  Anti-infectives (From admission, onward)    Start     Dose/Rate Route Frequency Ordered Stop   04/26/23 0800  cefoTEtan (CEFOTAN) 2 g in sodium chloride 0.9 % 100 mL IVPB        2 g 200 mL/hr over 30 Minutes Intravenous To ShortStay Surgical 04/23/23 2111 04/27/23 0800   04/22/23 2300  piperacillin-tazobactam (ZOSYN) IVPB 3.375 g        3.375 g 12.5 mL/hr over 240 Minutes Intravenous Every 8 hours 04/22/23 2256     04/22/23 1900  piperacillin-tazobactam (ZOSYN) IVPB 3.375 g        3.375 g 100 mL/hr over 30 Minutes Intravenous  Once 04/22/23 1852 04/22/23 1954        Subjective: Patient seen and examined at bedside.  She complains of intermittent abdominal pain along with fever.  Denies worsening chest pain or shortness of breath. Objective: Vitals:   04/23/23 1504  04/23/23 2015 04/23/23 2246 04/24/23 0429  BP: (!) 113/53 (!) 132/46  (!) 111/51  Pulse: 77 74  71  Resp: 18 20  20   Temp: 98.4 F (36.9 C) (!) 101.1 F (38.4 C) 99.3 F (37.4 C) 98.6 F (37 C)  TempSrc: Oral Oral Oral Oral   SpO2: 100% 99%  100%  Weight:    82.7 kg  Height:        Intake/Output Summary (Last 24 hours) at 04/24/2023 0737 Last data filed at 04/23/2023 1100 Gross per 24 hour  Intake 0 ml  Output 200 ml  Net -200 ml   Filed Weights   04/22/23 1829 04/24/23 0429  Weight: 79.4 kg 82.7 kg    Examination:  General: On 4 L oxygen via nasal cannula.  No distress.  Chronically ill and deconditioned looking. ENT/neck: No thyromegaly.  JVD is not elevated  respiratory: Decreased breath sounds at bases bilaterally with some crackles; no wheezing  CVS: S1-S2 heard, rate controlled currently Abdominal: Soft, obese, and lower quadrant, slightly distended; no organomegaly, bowel sounds are heard Extremities: Mild lower extremity edema; no cyanosis  CNS: Awake and alert.  No focal neurologic deficit.  Moves extremities Lymph: No obvious lymphadenopathy Skin: No obvious ecchymosis/lesions  psych: Mostly flat affect.  Not agitated. musculoskeletal: No obvious joint swelling/deformity     Data Reviewed: I have personally reviewed following labs and imaging studies  CBC: Recent Labs  Lab 04/22/23 1840 04/23/23 0710 04/24/23 0441  WBC 11.9* 9.9 9.2  NEUTROABS 9.9* 8.1* 7.3  HGB 11.6* 10.0* 10.3*  HCT 36.2 31.8* 32.1*  MCV 81.3 82.4 81.7  PLT 246 202 240   Basic Metabolic Panel: Recent Labs  Lab 04/19/23 1534 04/22/23 1840 04/22/23 2211 04/23/23 0710 04/24/23 0441  NA 137 129*  --  131* 131*  K 3.9 3.8  --  4.0 3.2*  CL 94* 90*  --  95* 94*  CO2 30 26  --  25 26  GLUCOSE 164* 162*  --  164* 153*  BUN 20 19  --  14 12  CREATININE 0.81 1.11*  --  0.85 0.88  CALCIUM 10.5* 9.0  --  8.6* 9.2  MG 1.7  --  1.9 2.0 2.0   GFR: Estimated Creatinine Clearance: 51.9 mL/min (by C-G formula based on SCr of 0.88 mg/dL). Liver Function Tests: Recent Labs  Lab 04/22/23 1840 04/23/23 0710 04/24/23 0441  AST 28 19 19   ALT 21 19 20   ALKPHOS 57 44 52  BILITOT 1.2* 1.1 0.8  PROT 6.4*  5.9* 6.2*  ALBUMIN 2.9* 2.6* 2.6*   Recent Labs  Lab 04/22/23 1840  LIPASE 30   No results for input(s): "AMMONIA" in the last 168 hours. Coagulation Profile: Recent Labs  Lab 04/22/23 1840  INR 1.7*   Cardiac Enzymes: No results for input(s): "CKTOTAL", "CKMB", "CKMBINDEX", "TROPONINI" in the last 168 hours. BNP (last 3 results) No results for input(s): "PROBNP" in the last 8760 hours. HbA1C: Recent Labs    04/23/23 2314  HGBA1C 6.2*   CBG: Recent Labs  Lab 04/22/23 2144  GLUCAP 136*   Lipid Profile: No results for input(s): "CHOL", "HDL", "LDLCALC", "TRIG", "CHOLHDL", "LDLDIRECT" in the last 72 hours. Thyroid Function Tests: No results for input(s): "TSH", "T4TOTAL", "FREET4", "T3FREE", "THYROIDAB" in the last 72 hours. Anemia Panel: No results for input(s): "VITAMINB12", "FOLATE", "FERRITIN", "TIBC", "IRON", "RETICCTPCT" in the last 72 hours. Sepsis Labs: Recent Labs  Lab 04/22/23 1905 04/22/23 2211 04/23/23 0114  LATICACIDVEN 1.8  1.1 0.8    Recent Results (from the past 240 hour(s))  Resp panel by RT-PCR (RSV, Flu A&B, Covid) Anterior Nasal Swab     Status: None   Collection Time: 04/22/23  6:38 PM   Specimen: Anterior Nasal Swab  Result Value Ref Range Status   SARS Coronavirus 2 by RT PCR NEGATIVE NEGATIVE Final   Influenza A by PCR NEGATIVE NEGATIVE Final   Influenza B by PCR NEGATIVE NEGATIVE Final    Comment: (NOTE) The Xpert Xpress SARS-CoV-2/FLU/RSV plus assay is intended as an aid in the diagnosis of influenza from Nasopharyngeal swab specimens and should not be used as a sole basis for treatment. Nasal washings and aspirates are unacceptable for Xpert Xpress SARS-CoV-2/FLU/RSV testing.  Fact Sheet for Patients: BloggerCourse.com  Fact Sheet for Healthcare Providers: SeriousBroker.it  This test is not yet approved or cleared by the [place] FDA and has been authorized for detection  and/or diagnosis of SARS-CoV-2 by FDA under an Emergency Use Authorization (EUA). This EUA will remain in effect (meaning this test can be used) for the duration of the COVID-19 declaration under Section 564(b)(1) of the Act, 21 U.S.C. section 360bbb-3(b)(1), unless the authorization is terminated or revoked.     Resp Syncytial Virus by PCR NEGATIVE NEGATIVE Final    Comment: (NOTE) Fact Sheet for Patients: BloggerCourse.com  Fact Sheet for Healthcare Providers: SeriousBroker.it  This test is not yet approved or cleared by the [place] FDA and has been authorized for detection and/or diagnosis of SARS-CoV-2 by FDA under an Emergency Use Authorization (EUA). This EUA will remain in effect (meaning this test can be used) for the duration of the COVID-19 declaration under Section 564(b)(1) of the Act, 21 U.S.C. section 360bbb-3(b)(1), unless the authorization is terminated or revoked.  Performed at [facility], [address] [place], [place] [address]   Blood Culture (routine x 2)     Status: None (Preliminary result)   Collection Time: 04/22/23  6:40 PM   Specimen: BLOOD RIGHT ARM  Result Value Ref Range Status   Specimen Description BLOOD RIGHT ARM  Final   Special Requests   Final    BOTTLES DRAWN AEROBIC AND ANAEROBIC Blood Culture adequate volume   Culture   Final    NO GROWTH < 12 HOURS Performed at [facility], [address], [place], [place] [address]    Report Status PENDING  Incomplete  Blood Culture (routine x 2)     Status: None (Preliminary result)   Collection Time: 04/22/23  7:08 PM   Specimen: BLOOD LEFT ARM  Result Value Ref Range Status   Specimen Description BLOOD LEFT ARM  Final   Special Requests   Final    BOTTLES DRAWN AEROBIC AND ANAEROBIC Blood Culture adequate volume   Culture   Final    NO GROWTH < 12 HOURS Performed at [facility], [address], [place], [place]  [address]    Report Status PENDING  Incomplete         Radiology Studies: DG Chest Port 1 View  Result Date: 04/22/2023 CLINICAL DATA:  Dark stool, bowel perforation EXAM: PORTABLE CHEST 1 VIEW COMPARISON:  CT 04/22/2023,, chest x-ray 04/09/2023 FINDINGS: Left-sided multi lead pacing device as before. Enlarged cardiomediastinal silhouette with vascular congestion. Aortic atherosclerosis. Minimal atelectasis or scarring at left base. No pleural effusion. IMPRESSION: Enlarged cardiomediastinal silhouette with mild vascular congestion. Electronically Signed   By: Jasmine Pang M.D.   On: 04/22/2023  22:01   CT ABDOMEN PELVIS W CONTRAST  Result Date: 04/22/2023 CLINICAL DATA:  Sigmoid diverticulitis. Colovaginal fistula. Chronic constipation. EXAM: CT ABDOMEN AND PELVIS WITH CONTRAST TECHNIQUE: Multidetector CT imaging of the abdomen and pelvis was performed using the standard protocol following bolus administration of intravenous contrast. RADIATION DOSE REDUCTION: This exam was performed according to the departmental dose-optimization program which includes automated exposure control, adjustment of the mA and/or kV according to patient size and/or use of iterative reconstruction technique. CONTRAST:  OMNIPAQUE IOHEXOL 300 MG/ML  SOLN COMPARISON:  04/09/2023 FINDINGS: Lower Chest: No acute findings. Hepatobiliary: No suspicious hepatic masses identified. Prior cholecystectomy. No evidence of biliary obstruction. Pancreas:  No mass or inflammatory changes. Spleen: Within normal limits in size and appearance. Adrenals/Urinary Tract: No suspicious masses identified. No evidence of ureteral calculi or hydronephrosis. Stomach/Bowel: Moderate sigmoid diverticulitis has increased since previous study. New focal inflammatory collection with internal gas bubbles seen in the central sigmoid mesocolon measuring 4.1 x 3.0 cm, consistent with localized perforation. No No evidence of free intraperitoneal air. A  new rim enhancing fluid collection is seen in the right perirectal region measuring 5.6 x 4.0 cm, consistent with abscess. A new rim enhancing fluid collection is also seen in the anterior left lower quadrant which measures 6.0 x 4.1 cm, also consistent with abscess. A small amount of gas is again seen in the vagina, suspicious for colovaginal fistula. Increased mild reactive wall thickening of the adjacent urinary bladder also seen. Vascular/Lymphatic: No pathologically enlarged lymph nodes. No acute vascular findings. Reproductive:  Prior hysterectomy. Other:  None. Musculoskeletal:  No suspicious bone lesions identified. IMPRESSION: Worsening sigmoid diverticulitis, with new 4.1 cm focal inflammatory collection with gas bubbles in the central sigmoid mesocolon, consistent with localized perforation. New 5.6 cm right perirectal abscess. New 6.0 cm left lower quadrant abscess. Persistent small amount of gas in the vagina, suspicious for colovaginal fistula. Electronically Signed   By: Danae Orleans M.D.   On: 04/22/2023 16:43        Scheduled Meds:  acetaminophen  650 mg Oral Once   [START ON 04/25/2023] Chlorhexidine Gluconate Cloth  6 each Topical Once   And   [START ON 04/26/2023] Chlorhexidine Gluconate Cloth  6 each Topical Once   docusate sodium  100 mg Oral Daily   dofetilide  250 mcg Oral Q12H   enoxaparin (LOVENOX) injection  80 mg Subcutaneous BID   feeding supplement  1 Container Oral BID BM   [START ON 04/26/2023] feeding supplement  296 mL Oral Once   [START ON 04/25/2023] feeding supplement  592 mL Oral Once   fluticasone furoate-vilanterol  1 puff Inhalation Daily   furosemide  40 mg Intravenous Once   gabapentin  200 mg Oral QHS   multivitamin with minerals  1 tablet Oral Daily   pantoprazole  40 mg Oral Daily   revefenacin  175 mcg Nebulization Daily   Continuous Infusions:  [START ON 04/26/2023] cefoTEtan (CEFOTAN) IV     piperacillin-tazobactam (ZOSYN)  IV 3.375 g  (04/24/23 0656)   promethazine (PHENERGAN) injection (IM or IVPB)            Kathleen Lloyd, MD [facility] 04/24/2023, 7:37 AM

## 2023-04-24 NOTE — Progress Notes (Signed)
   Rounding Note    Patient Name: WAYNETTA WENNERSTROM Date of Encounter: 04/24/2023  [facility] Cardiologist: None   Subjective   NAEO. No significant increase in UOP on lasix yesterday.  Vital Signs    Vitals:   04/23/23 2015 04/23/23 2246 04/24/23 0429 04/24/23 0846  BP: (!) 132/46  (!) 111/51 110/81  Pulse: 74  71 70  Resp: 20  20 20   Temp: (!) 101.1 F (38.4 C) 99.3 F (37.4 C) 98.6 F (37 C) 99.9 F (37.7 C)  TempSrc: Oral Oral Oral Oral  SpO2: 99%  100% 100%  Weight:   82.7 kg   Height:        Intake/Output Summary (Last 24 hours) at 04/24/2023 0953 Last data filed at 04/23/2023 1100 Gross per 24 hour  Intake --  Output 200 ml  Net -200 ml      04/24/2023    4:29 AM 04/22/2023    6:29 PM 04/19/2023    2:31 PM  Last 3 Weights  Weight (lbs) 182 lb 5.1 oz 175 lb 183 lb 2 oz  Weight (kg) 82.7 kg 79.379 kg 83.065 kg      Physical Exam   GEN: Chronically ill appearing elderly woman on [place] in bed at 45 degrees Cardiac: RRR, no murmurs, rubs, or gallops. 1+ pitting bilateral LE edema to knees Respiratory: Clear to auscultation bilaterally. 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 NYHA III.  Warm and wet on exam. Redose IV lasix today. Replete K/Mg.  #AF #High risk med monitoring - dofetilide Keep K>4, Mg>2 Cr stable QTc 410 on ECG this admission Lovenox for [facility]  #Fistula #PreOp Tentatively planning for surgery early this week. She is high risk, as detailed on initial consult note. Cont HF optimization as above.       Sheria Lang T. Lalla Brothers, MD, [facility], [facility] Cardiac Electrophysiology

## 2023-04-24 NOTE — Progress Notes (Signed)
Patient using home BIPAP unit.

## 2023-04-25 DIAGNOSIS — K5720 Diverticulitis of large intestine with perforation and abscess without bleeding: Secondary | ICD-10-CM | POA: Diagnosis not present

## 2023-04-25 DIAGNOSIS — I4891 Unspecified atrial fibrillation: Secondary | ICD-10-CM | POA: Diagnosis not present

## 2023-04-25 DIAGNOSIS — I5043 Acute on chronic combined systolic (congestive) and diastolic (congestive) heart failure: Secondary | ICD-10-CM | POA: Diagnosis not present

## 2023-04-25 LAB — BASIC METABOLIC PANEL
Anion gap: 11 (ref 5–15)
BUN: 7 mg/dL — ABNORMAL LOW (ref 8–23)
CO2: 24 mmol/L (ref 22–32)
Calcium: 9.1 mg/dL (ref 8.9–10.3)
Chloride: 95 mmol/L — ABNORMAL LOW (ref 98–111)
Creatinine, Ser: 0.86 mg/dL (ref 0.44–1.00)
GFR, Estimated: >60 mL/min (ref >60)
Glucose, Bld: 150 mg/dL — ABNORMAL HIGH (ref 70–99)
Potassium: 4 mmol/L (ref 3.5–5.1)
Sodium: 130 mmol/L — ABNORMAL LOW (ref 135–145)

## 2023-04-25 LAB — MAGNESIUM: Magnesium: 1.8 mg/dL (ref 1.7–2.4)

## 2023-04-25 LAB — CBC WITH DIFFERENTIAL/PLATELET
Abs Immature Granulocytes: 0.14 10*3/uL — ABNORMAL HIGH (ref 0.00–0.07)
Basophils Absolute: 0 10*3/uL (ref 0.0–0.1)
Basophils Relative: 0 %
Eosinophils Absolute: 0.1 10*3/uL (ref 0.0–0.5)
Eosinophils Relative: 2 %
HCT: 28.8 % — ABNORMAL LOW (ref 36.0–46.0)
Hemoglobin: 9.4 g/dL — ABNORMAL LOW (ref 12.0–15.0)
Immature Granulocytes: 2 %
Lymphocytes Relative: 8 %
Lymphs Abs: 0.6 10*3/uL — ABNORMAL LOW (ref 0.7–4.0)
MCH: 26.9 pg (ref 26.0–34.0)
MCHC: 32.6 g/dL (ref 30.0–36.0)
MCV: 82.5 fL (ref 80.0–100.0)
Monocytes Absolute: 0.6 10*3/uL (ref 0.1–1.0)
Monocytes Relative: 8 %
Neutro Abs: 5.9 10*3/uL (ref 1.7–7.7)
Neutrophils Relative %: 80 %
Platelets: 235 10*3/uL (ref 150–400)
RBC: 3.49 MIL/uL — ABNORMAL LOW (ref 3.87–5.11)
RDW: 15.2 % (ref 11.5–15.5)
WBC: 7.4 10*3/uL (ref 4.0–10.5)
nRBC: 0 % (ref 0.0–0.2)

## 2023-04-25 MED ORDER — ONDANSETRON HCL 4 MG/2ML IJ SOLN
4.0000 mg | Freq: Four times a day (QID) | INTRAMUSCULAR | Status: DC | PRN
  Administered 2023-04-25 – 2023-05-19 (×40): 4 mg via INTRAVENOUS
  Filled 2023-04-25 (×40): qty 2

## 2023-04-25 MED ORDER — FUROSEMIDE 10 MG/ML IJ SOLN
60.0000 mg | Freq: Once | INTRAMUSCULAR | Status: AC
  Administered 2023-04-25: 60 mg via INTRAVENOUS
  Filled 2023-04-25: qty 6

## 2023-04-25 MED ORDER — GUAIFENESIN-DM 100-10 MG/5ML PO SYRP
10.0000 mL | ORAL_SOLUTION | ORAL | Status: DC | PRN
  Administered 2023-04-25 – 2023-05-16 (×18): 10 mL via ORAL
  Filled 2023-04-25 (×19): qty 10

## 2023-04-25 NOTE — Progress Notes (Signed)
   Rounding Note    Patient Name: Kathleen [name] Date of Encounter: 04/25/2023  [facility] Cardiologist: None   Subjective   NAEO.   Vital Signs    Vitals:   04/25/23 0422 04/25/23 0423 04/25/23 0816 04/25/23 0831  BP: 117/61  113/66   Pulse: 72  73   Resp: 18  16   Temp: 100.2 F (37.9 C)  98.8 F (37.1 C)   TempSrc: Oral  Oral   SpO2: 100%  99% 100%  Weight:  83.5 kg    Height:        Intake/Output Summary (Last 24 hours) at 04/25/2023 0934 Last data filed at 04/25/2023 0300 Gross per 24 hour  Intake 420 ml  Output 750 ml  Net -330 ml      04/25/2023    4:23 AM 04/24/2023    4:29 AM 04/22/2023    6:29 PM  Last 3 Weights  Weight (lbs) 184 lb 1.4 oz 182 lb 5.1 oz 175 lb  Weight (kg) 83.5 kg 82.7 kg 79.379 kg      Physical Exam   GEN: Chronically ill appearing elderly woman on [place] on side of bed Cardiac: RRR, no murmurs, rubs, or gallops. Trace edema bilaterally. Respiratory: Clear to auscultation bilaterally. 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 NYHA III.  Warm and wet on exam. Redose IV lasix today. Replete K/Mg.  #AF #High risk med monitoring - dofetilide Keep K>4, Mg>2 Cr stable QTc 410 on ECG this admission Lovenox for [facility]  #Fistula #PreOp Tentatively planning for surgery early this week. She is high risk, as detailed on initial consult note. Cont HF optimization as above.   Sheria Lang T. Lalla Brothers, MD, [facility], [facility] Cardiac Electrophysiology

## 2023-04-25 NOTE — Progress Notes (Signed)
Mobility Specialist: Progress Note   04/25/23 1139  Mobility  Activity Ambulated with assistance in hallway  Level of Assistance Contact guard assist, steadying assist  Assistive Device Front wheel walker  Distance Ambulated (ft) 75 ft  Activity Response Tolerated well  Mobility Referral Yes  $Mobility charge 1 Mobility  Mobility Specialist Start Time (ACUTE ONLY) 0902  Mobility Specialist Stop Time (ACUTE ONLY) [id number]  Mobility Specialist Time Calculation (min) (ACUTE ONLY) 35 min    Post-Mobility: SpO2 97% 4LO2  Pt was agreeable to mobility session - received in bed on 3.5LO2. Had c/o R thigh pain. SV for bed mobility, light minA for STS, CG for ambulation. Unable to get an accurate pleth during ambulation via pulse ox - pt ambulating on 4LO2 but further ambulation deferred d/t pt c/o feeling SOB and lightheaded. Returned room without fault. Sat on SpO2 97% 4LO2 seated EOB. Left in bed on 3.5LO2 with all needs met, call bell in reach.   Maurene Capes Mobility Specialist Please contact via SecureChat or Rehab office at [phone]

## 2023-04-25 NOTE — Progress Notes (Signed)
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  She had an outpatient CT of abdomen and pelvis done as per Dr. Meridee Score which showed worsening diverticulitis with thickening of bowels, 1.2 cm localized perforation, 5.6 cm perirectal abscess.  On presentation, Tmax was 101.8, sodium was 129.  She was started on IV antibiotics.  General surgery was consulted.  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  Currently on IV Zosyn.  Blood cultures negative so far.   -Tmax of 100.2 over the last 24 hours since  Leukocytosis -Resolved.  Monitor  Hyponatremia -Mild.  Monitor  Hypokalemia -Improved  Persistent A-fib -Xarelto on hold.  Currently on Lovenox.  Currently rate controlled.  Continue Tikosyn  Chronic systolic heart failure -Currently compensated.  Did not tolerate Entresto and beta-blocker due to low blood pressure.  Blood pressure on the lower side.  Cardiology following.  Diuretics as per cardiology.  Strict input and output.  Daily weights.  Fluid  restriction  Chronic respiratory failure with hypoxia COPD  BOOP -Normally wears 4 to 5 L oxygen via nasal cannula.  Continue current inhaled and nebulizer regimen  OSA -On BiPAP at night  Diabetes mellitus type 2 with diabetic neuropathy -Continue gabapentin.  Continue CBGs  Obesity -Outpatient follow-up  Physical deconditioning -PT eval after surgical intervention  Goals of care -Palliative care evaluation appreciated.  CODE STATUS has been changed to DNR.  Overall prognosis is guarded to poor.  Moderate malnutrition -Follow nutrition recommendations   DVT prophylaxis: Lovenox  code Status: DNR Family Communication: Son at bedside on 04/23/23 Disposition Plan: Status is: Inpatient Remains inpatient appropriate because: Of severity of illness    Consultants: General surgery.  GI.  cardiology and palliative care  Procedures: None  Antimicrobials:  Anti-infectives (From admission, onward)    Start     Dose/Rate Route Frequency Ordered Stop   04/26/23 0800  cefoTEtan (CEFOTAN) 2 g in sodium chloride 0.9 % 100 mL IVPB        2 g 200 mL/hr over 30 Minutes Intravenous To ShortStay Surgical 04/23/23 2111 04/27/23 0800   04/22/23 2300  piperacillin-tazobactam (ZOSYN) IVPB 3.375 g        3.375 g 12.5 mL/hr over 240 Minutes Intravenous Every 8 hours 04/22/23 2256     04/22/23 1900  piperacillin-tazobactam (ZOSYN) IVPB 3.375 g        3.375 g 100 mL/hr over 30 Minutes Intravenous  Once 04/22/23 1852 04/22/23 1954        Subjective: Patient seen and examined at bedside.  Still complains of intermittent abdominal pain.  Denies worsening shortness of breath, chest pain or vomiting.   Objective: Vitals:   04/24/23 1718 04/24/23 1955 04/25/23 0422 04/25/23 0423  BP: (!) 111/59 (!) 107/48 117/61   Pulse: 76 74 72   Resp: 17 18 18    Temp: 97.7 F (36.5 C) 98.6 F (37 C) 100.2 F (37.9 C)   TempSrc:  Oral Oral   SpO2: 100% 99% 100%   Weight:    83.5 kg  Height:         Intake/Output Summary (Last 24 hours) at 04/25/2023 0752 Last data filed at 04/25/2023 0300 Gross per 24 hour  Intake 650 ml  Output 750 ml  Net -100 ml   Filed Weights   04/22/23 1829 04/24/23 0429 04/25/23 0423  Weight: 79.4 kg 82.7 kg 83.5 kg    Examination:  General: No acute distress.  Remains on 4 L oxygen by nasal cannula.  Chronically ill and deconditioned looking. ENT/neck: No JVD elevation or palpable neck masses noted respiratory: Bilateral decreased breath sounds at bases with scattered crackles CVS: Rate controlled; S1 and S2 are heard Abdominal: Soft, obese, still distended and tender in the lower quadrant; no organomegaly, bowel sounds normally heard  extremities: No clubbing; trace lower extremity edema present CNS: Alert and oriented.  No focal neurologic deficit.  Moving extremities  lymph: No obvious palpable lymphadenopathy Skin: No obvious rashes/petechiae psych: Showing no signs of agitation; flat affect mostly  musculoskeletal: No obvious joint erythema/tenderness     Data Reviewed: I have personally reviewed following labs and imaging studies  CBC: Recent Labs  Lab 04/22/23 1840 04/23/23 0710 04/24/23 0441 04/25/23 0618  WBC 11.9* 9.9 9.2 7.4  NEUTROABS 9.9* 8.1* 7.3 5.9  HGB 11.6* 10.0* 10.3* 9.4*  HCT 36.2 31.8* 32.1* 28.8*  MCV 81.3 82.4 81.7 82.5  PLT 246 202 240 235   Basic Metabolic Panel: Recent Labs  Lab 04/19/23 1534 04/22/23 1840 04/22/23 2211 04/23/23 0710 04/24/23 0441 04/25/23 0618  NA 137 129*  --  131* 131* 130*  K 3.9 3.8  --  4.0 3.2* 4.0  CL 94* 90*  --  95* 94* 95*  CO2 30 26  --  25 26 24   GLUCOSE 164* 162*  --  164* 153* 150*  BUN 20 19  --  14 12 7*  CREATININE 0.81 1.11*  --  0.85 0.88 0.86  CALCIUM 10.5* 9.0  --  8.6* 9.2 9.1  MG 1.7  --  1.9 2.0 2.0 1.8   GFR: Estimated Creatinine Clearance: 53.3 mL/min (by C-G formula based on SCr of 0.86 mg/dL). Liver Function Tests: Recent Labs  Lab  04/22/23 1840 04/23/23 0710 04/24/23 0441  AST 28 19 19   ALT 21 19 20   ALKPHOS 57 44 52  BILITOT 1.2* 1.1 0.8  PROT 6.4* 5.9* 6.2*  ALBUMIN 2.9* 2.6* 2.6*   Recent Labs  Lab 04/22/23 1840  LIPASE 30   No results for input(s): "AMMONIA" in the last 168 hours. Coagulation Profile: Recent Labs  Lab 04/22/23 1840  INR 1.7*   Cardiac Enzymes: No results for input(s): "CKTOTAL", "CKMB", "CKMBINDEX", "TROPONINI" in the last 168 hours. BNP (last 3 results) No results for input(s): "PROBNP" in the last 8760 hours. HbA1C: Recent Labs    04/23/23 2314  HGBA1C 6.2*   CBG: Recent Labs  Lab 04/22/23 2144  GLUCAP 136*   Lipid Profile: No results for input(s): "CHOL", "HDL", "LDLCALC", "TRIG", "CHOLHDL", "LDLDIRECT" in the last 72 hours. Thyroid Function Tests: No results for input(s): "TSH", "T4TOTAL", "FREET4", "T3FREE", "THYROIDAB" in the last 72 hours. Anemia Panel: No results for input(s): "VITAMINB12", "FOLATE", "FERRITIN", "TIBC", "IRON", "RETICCTPCT"  in the last 72 hours. Sepsis Labs: Recent Labs  Lab 04/22/23 1905 04/22/23 2211 04/23/23 0114  LATICACIDVEN 1.8 1.1 0.8    Recent Results (from the past 240 hour(s))  Resp panel by RT-PCR (RSV, Flu A&B, Covid) Anterior Nasal Swab     Status: None   Collection Time: 04/22/23  6:38 PM   Specimen: Anterior Nasal Swab  Result Value Ref Range Status   SARS Coronavirus 2 by RT PCR NEGATIVE NEGATIVE Final   Influenza A by PCR NEGATIVE NEGATIVE Final   Influenza B by PCR NEGATIVE NEGATIVE Final    Comment: (NOTE) The Xpert Xpress SARS-CoV-2/FLU/RSV plus assay is intended as an aid in the diagnosis of influenza from Nasopharyngeal swab specimens and should not be used as a sole basis for treatment. Nasal washings and aspirates are unacceptable for Xpert Xpress SARS-CoV-2/FLU/RSV testing.  Fact Sheet for Patients: BloggerCourse.com  Fact Sheet for Healthcare  Providers: SeriousBroker.it  This test is not yet approved or cleared by the [place] FDA and has been authorized for detection and/or diagnosis of SARS-CoV-2 by FDA under an Emergency Use Authorization (EUA). This EUA will remain in effect (meaning this test can be used) for the duration of the COVID-19 declaration under Section 564(b)(1) of the Act, 21 U.S.C. section 360bbb-3(b)(1), unless the authorization is terminated or revoked.     Resp Syncytial Virus by PCR NEGATIVE NEGATIVE Final    Comment: (NOTE) Fact Sheet for Patients: BloggerCourse.com  Fact Sheet for Healthcare Providers: SeriousBroker.it  This test is not yet approved or cleared by the [place] FDA and has been authorized for detection and/or diagnosis of SARS-CoV-2 by FDA under an Emergency Use Authorization (EUA). This EUA will remain in effect (meaning this test can be used) for the duration of the COVID-19 declaration under Section 564(b)(1) of the Act, 21 U.S.C. section 360bbb-3(b)(1), unless the authorization is terminated or revoked.  Performed at [facility], [address] [place], [place] [address]   Blood Culture (routine x 2)     Status: None (Preliminary result)   Collection Time: 04/22/23  6:40 PM   Specimen: BLOOD RIGHT ARM  Result Value Ref Range Status   Specimen Description BLOOD RIGHT ARM  Final   Special Requests   Final    BOTTLES DRAWN AEROBIC AND ANAEROBIC Blood Culture adequate volume   Culture   Final    NO GROWTH 2 DAYS Performed at [facility], [address] [place], [place] [address]    Report Status PENDING  Incomplete  Blood Culture (routine x 2)     Status: None (Preliminary result)   Collection Time: 04/22/23  7:08 PM   Specimen: BLOOD LEFT ARM  Result Value Ref Range Status   Specimen Description BLOOD LEFT ARM  Final   Special Requests   Final    BOTTLES DRAWN AEROBIC  AND ANAEROBIC Blood Culture adequate volume   Culture   Final    NO GROWTH 2 DAYS Performed at [facility], [address], [place], [place] [address]    Report Status PENDING  Incomplete         Radiology Studies: No results found.      Scheduled Meds:  acetaminophen  650 mg Oral Once   Chlorhexidine Gluconate Cloth  6 each Topical Once   And   [START ON 04/26/2023] Chlorhexidine Gluconate Cloth  6 each Topical Once   docusate sodium  100 mg Oral Daily   dofetilide  250  mcg Oral Q12H   enoxaparin (LOVENOX) injection  80 mg Subcutaneous BID   feeding supplement  1 Container Oral BID BM   [START ON 04/26/2023] feeding supplement  296 mL Oral Once   feeding supplement  592 mL Oral Once   fluticasone furoate-vilanterol  1 puff Inhalation Daily   gabapentin  200 mg Oral QHS   multivitamin with minerals  1 tablet Oral Daily   pantoprazole  40 mg Oral Daily   revefenacin  175 mcg Nebulization Daily   Continuous Infusions:  [START ON 04/26/2023] cefoTEtan (CEFOTAN) IV     piperacillin-tazobactam (ZOSYN)  IV 3.375 g (04/25/23 0546)   promethazine (PHENERGAN) injection (IM or IVPB)            Glade Lloyd, MD [facility] 04/25/2023, 7:52 AM

## 2023-04-25 NOTE — Progress Notes (Signed)
   04/25/23 2351  BiPAP/CPAP/SIPAP  Reason BIPAP/CPAP not in use Other(comment) (Patients home unit)  Mask Type Full face mask  Mask Size Medium

## 2023-04-25 NOTE — Progress Notes (Signed)
   Subjective/Chief Complaint: PT with no changes overnight   Objective: Vital signs in last 24 hours: Temp:  [97.7 F (36.5 C)-100.2 F (37.9 C)] 98.8 F (37.1 C) (11/17 0816) Pulse Rate:  [70-76] 73 (11/17 0816) Resp:  [16-20] 16 (11/17 0816) BP: (107-117)/(48-81) 113/66 (11/17 0816) SpO2:  [99 %-100 %] 100 % (11/17 0831) Weight:  [83.5 kg] 83.5 kg (11/17 0423) Last BM Date : 04/24/23  Intake/Output from previous day: 11/16 0701 - 11/17 0700 In: 650 [P.O.:350; IV Piggyback:300] Out: 750 [Urine:750] Intake/Output this shift: No intake/output data recorded.  PE:  Constitutional: No acute distress, conversant, appears states age. Eyes: Anicteric sclerae, moist conjunctiva, no lid lag Lungs: Clear to auscultation bilaterally, normal respiratory effort CV: regular rate and rhythm, no murmurs, no peripheral edema, pedal pulses 2+ GI: Soft, no masses or hepatosplenomegaly, tender to palpation lower abd Skin: No rashes, palpation reveals normal turgor Psychiatric: appropriate judgment and insight, oriented to person, place, and time   Lab Results:  Recent Labs    04/24/23 0441 04/25/23 0618  WBC 9.2 7.4  HGB 10.3* 9.4*  HCT 32.1* 28.8*  PLT 240 235   BMET Recent Labs    04/24/23 0441 04/25/23 0618  NA 131* 130*  K 3.2* 4.0  CL 94* 95*  CO2 26 24  GLUCOSE 153* 150*  BUN 12 7*  CREATININE 0.88 0.86  CALCIUM 9.2 9.1   PT/INR Recent Labs    04/22/23 1840  LABPROT 20.0*  INR 1.7*   ABG No results for input(s): "PHART", "HCO3" in the last 72 hours.  Invalid input(s): "PCO2", "PO2"  Studies/Results: No results found.  Anti-infectives: Anti-infectives (From admission, onward)    Start     Dose/Rate Route Frequency Ordered Stop   04/26/23 0800  cefoTEtan (CEFOTAN) 2 g in sodium chloride 0.9 % 100 mL IVPB        2 g 200 mL/hr over 30 Minutes Intravenous To ShortStay Surgical 04/23/23 2111 04/27/23 0800   04/22/23 2300  piperacillin-tazobactam  (ZOSYN) IVPB 3.375 g        3.375 g 12.5 mL/hr over 240 Minutes Intravenous Every 8 hours 04/22/23 2256     04/22/23 1900  piperacillin-tazobactam (ZOSYN) IVPB 3.375 g        3.375 g 100 mL/hr over 30 Minutes Intravenous  Once 04/22/23 1852 04/22/23 1954       Assessment/Plan: 77 year old female with 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 She was to see Dr. Michaell Cowing as an outpatient but unfortunately keeps getting admitted to the hospital due to persistent symptoms. She has significant comorbidities and roughly a 10% risk of cardiac issues due to her congestive heart failure   -Plan for Dr. Dwain Sarna Monday to d/w pt timing on surgery  -GI not planning on preop c-scope after discussion with the patient.   ?On table c-scope -Con't abx -Cards has eval'd pt.  Appreciate their guidance   CHF HTN COPD on O2 Afib DM Hypothryoidism CV fistula  LOS: 3 days    Axel Filler 04/25/2023

## 2023-04-25 NOTE — Plan of Care (Signed)
  Problem: Pain Management: Goal: General experience of comfort will improve Outcome: Progressing   Problem: Safety: Goal: Ability to remain free from injury will improve Outcome: Progressing

## 2023-04-26 ENCOUNTER — Encounter (HOSPITAL_COMMUNITY): Payer: Medicare Other

## 2023-04-26 DIAGNOSIS — K5720 Diverticulitis of large intestine with perforation and abscess without bleeding: Secondary | ICD-10-CM | POA: Diagnosis not present

## 2023-04-26 DIAGNOSIS — I429 Cardiomyopathy, unspecified: Secondary | ICD-10-CM

## 2023-04-26 LAB — BASIC METABOLIC PANEL
Anion gap: 11 (ref 5–15)
Anion gap: 12 (ref 5–15)
BUN: 8 mg/dL (ref 8–23)
BUN: 7 mg/dL — ABNORMAL LOW (ref 8–23)
CO2: 24 mmol/L (ref 22–32)
CO2: 28 mmol/L (ref 22–32)
Calcium: 9.2 mg/dL (ref 8.9–10.3)
Calcium: 9.2 mg/dL (ref 8.9–10.3)
Chloride: 94 mmol/L — ABNORMAL LOW (ref 98–111)
Chloride: 91 mmol/L — ABNORMAL LOW (ref 98–111)
Creatinine, Ser: 1.19 mg/dL — ABNORMAL HIGH (ref 0.44–1.00)
Creatinine, Ser: 0.86 mg/dL (ref 0.44–1.00)
GFR, Estimated: 47 mL/min — ABNORMAL LOW (ref >60)
GFR, Estimated: >60 mL/min (ref >60)
Glucose, Bld: 158 mg/dL — ABNORMAL HIGH (ref 70–99)
Glucose, Bld: 138 mg/dL — ABNORMAL HIGH (ref 70–99)
Potassium: 3.2 mmol/L — ABNORMAL LOW (ref 3.5–5.1)
Potassium: 3.8 mmol/L (ref 3.5–5.1)
Sodium: 129 mmol/L — ABNORMAL LOW (ref 135–145)
Sodium: 131 mmol/L — ABNORMAL LOW (ref 135–145)

## 2023-04-26 LAB — CBC WITH DIFFERENTIAL/PLATELET
Abs Immature Granulocytes: 0.39 10*3/uL — ABNORMAL HIGH (ref 0.00–0.07)
Basophils Absolute: 0.1 10*3/uL (ref 0.0–0.1)
Basophils Relative: 1 %
Eosinophils Absolute: 0.2 10*3/uL (ref 0.0–0.5)
Eosinophils Relative: 2 %
HCT: 31.2 % — ABNORMAL LOW (ref 36.0–46.0)
Hemoglobin: 9.6 g/dL — ABNORMAL LOW (ref 12.0–15.0)
Immature Granulocytes: 5 %
Lymphocytes Relative: 10 %
Lymphs Abs: 0.8 10*3/uL (ref 0.7–4.0)
MCH: 25.4 pg — ABNORMAL LOW (ref 26.0–34.0)
MCHC: 30.8 g/dL (ref 30.0–36.0)
MCV: 82.5 fL (ref 80.0–100.0)
Monocytes Absolute: 0.7 10*3/uL (ref 0.1–1.0)
Monocytes Relative: 9 %
Neutro Abs: 5.9 10*3/uL (ref 1.7–7.7)
Neutrophils Relative %: 73 %
Platelets: 269 10*3/uL (ref 150–400)
RBC: 3.78 MIL/uL — ABNORMAL LOW (ref 3.87–5.11)
RDW: 15.2 % (ref 11.5–15.5)
WBC: 8.1 10*3/uL (ref 4.0–10.5)
nRBC: 0 % (ref 0.0–0.2)

## 2023-04-26 LAB — MAGNESIUM
Magnesium: 1.8 mg/dL (ref 1.7–2.4)
Magnesium: 2.7 mg/dL — ABNORMAL HIGH (ref 1.7–2.4)

## 2023-04-26 MED ORDER — FUROSEMIDE 10 MG/ML IJ SOLN
80.0000 mg | Freq: Once | INTRAMUSCULAR | Status: AC
  Administered 2023-04-26: 80 mg via INTRAVENOUS
  Filled 2023-04-26: qty 8

## 2023-04-26 MED ORDER — POTASSIUM CHLORIDE 20 MEQ PO PACK
40.0000 meq | PACK | Freq: Once | ORAL | Status: AC
  Administered 2023-04-26: 40 meq via ORAL
  Filled 2023-04-26: qty 2

## 2023-04-26 MED ORDER — MAGNESIUM SULFATE 2 GM/50ML IV SOLN
2.0000 g | Freq: Once | INTRAVENOUS | Status: AC
Start: 2023-04-26 — End: 2023-04-26
  Administered 2023-04-26: 2 g via INTRAVENOUS
  Filled 2023-04-26: qty 50

## 2023-04-26 MED ORDER — FUROSEMIDE 10 MG/ML IJ SOLN: 80.0000 mg | Freq: Once | INTRAMUSCULAR | Status: DC

## 2023-04-26 NOTE — Progress Notes (Addendum)
Patient Name: Kathleen [name] Date of Encounter: 04/26/2023 [facility] Cardiologist: Marca Ancona, MD   Interval Summary  .    Reports poor diuresis and weight gain.  More orthopneic today.  Vital Signs .    Vitals:   04/25/23 1952 04/26/23 0405 04/26/23 0500 04/26/23 0737  BP: (!) 123/53 (!) 104/46    Pulse: 70 69    Resp: 18 18    Temp: 98.3 F (36.8 C) 98.5 F (36.9 C)    TempSrc: Oral Oral    SpO2: 100% 100%  99%  Weight:   84 kg   Height:        Intake/Output Summary (Last 24 hours) at 04/26/2023 0950 Last data filed at 04/26/2023 0800 Gross per 24 hour  Intake 0 ml  Output --  Net 0 ml      04/26/2023    5:00 AM 04/25/2023    4:23 AM 04/24/2023    4:29 AM  Last 3 Weights  Weight (lbs) 185 lb 3 oz 184 lb 1.4 oz 182 lb 5.1 oz  Weight (kg) 84 kg 83.5 kg 82.7 kg      Telemetry/ECG    V paced 70s- Personally Reviewed  CV Studies    Echocardiogram 09/15/2022 1. Left ventricular ejection fraction, by estimation, is 35 to 40%. The  left ventricle has moderately decreased function. The left ventricle  demonstrates regional wall motion abnormalities. Abnormal (paradoxical)  septal motion, consistent with RV  pacemaker     The left ventricular internal cavity size was moderately dilated.  There is mild left ventricular hypertrophy. Left ventricular diastolic  parameters are consistent with Grade I diastolic dysfunction (impaired  relaxation).   2. Right ventricular systolic function is normal. The right ventricular  size is normal.   3. The mitral valve is normal in structure. No evidence of mitral valve  regurgitation. No evidence of mitral stenosis.   4. The aortic valve was not well visualized. Aortic valve regurgitation  is not visualized. No aortic stenosis is present.   5. The inferior vena cava is normal in size with greater than 50%  respiratory variability, suggesting right atrial pressure of 3 mmHg.  Physical Exam .   GEN: No  acute distress.   Neck: + JVD Cardiac: RRR, no murmurs, rubs, or gallops.  Respiratory: crackles GI: Soft, nontender, distended  MS: mild edema  Patient Profile    Kathleen [name] is a 77 y.o. female has hx of   chronic HFrEF, nonischemic cardiomyopathy, atrial fibrillation status post ablation 2015, Medtronic PPM 2017, COPD chronically on supplemental oxygen 4 L, OSA on CPAP, BOOP tracheobronchomalacia, recurrent diverticulitis, colovesical fistula  and admitted on 04/22/2023.  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  Assessment & Plan .     Preoperative evaluation recurrent sigmoid diverticulitis with colovaginal fistula and abscess Generally stable from a heart failure standpoint with EF persistently 35 to 40%.  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 RCRI 10.1 % 30-day risk of death, MI, or cardiac arrest. Likely cannot complete greater than 4 METS, but functionally independent.  Overall procedure seems to be high risk however seems to be the only option.  Planning for procedure tomorrow.  Still needs further optimization from a volume standpoint.   Chronic HFrEF 35-40% Nonischemic cardiomyopathy Normal coronaries as above.  April echo shows EF 35 to 40% normal RV function.  Volume  up.  BP is soft.    She has been getting IV Lasix 60 mg daily with poor response.  Her abdomen seems much more distended since last week, gaining weight, also reporting more orthopnea/shortness of breath.  Still needs to be optimized from a volume standpoint.   Will increase to IV Lasix 80 mg.  Likely needs twice daily but will see how she responds on this dose first. Giving potassium. Continue holding spironolactone.  Did not tolerate Entresto due to low BP.  Titrate GDMT once more stable.   Atrial fibrillation status post AV nodal ablation Medtronic  PPM Intermittent A/V paced.  QTc 410 on 11/14.  Monitor QTc.  Maintain electrolyte balance potassium greater than 4, magnesium greater than 2.  Will need close monitoring for postoperative A-fib.  Will recheck QTc today.  Continue Tikosyn, Lovenox for now, transition to Xarelto when able.   COPD chronically on 4 L OSA on CPAP BOOP with tracheobronchomalacia Anemia For questions or updates, please contact [facility] Please consult [email or web address] for contact info under        Signed, Abagail Kitchens, PA-C    Patient seen and examined   I agree with findings as noted above by Thamas Jaegers Pt has received IV lasix earlier   Breathing some better   ON exam Pt is an obese 77 yo in NAD  Neck full but JVP appears elevated Lungs are relatively clear  Cardiac RRR  No S3   No murmurs  Ext with 1+ edema  Impression:  NICM   Pt with mod LV dysfunction on echo   Normal coronary arteries at cath She has some volume overload on exam.   Agree with increasing IV diuresis   Afib    Pt currently AV paced.   QTc OK    Keep on tikosyn and lovenox     Follow electrolytes closely pre and post op and replete  K greater than 4  Mg greater than 2.  K was low earlier  REpleted   Will recheck now and in am      Preop risk assessment  Pt at moderate increased risk for worsening CHF symptoms in post op period  WIll follow closely, adjust meds as needed         Dietrich Pates MD

## 2023-04-26 NOTE — Progress Notes (Signed)
No ICM remote transmission received for 04/26/2023 due to currently hospitalized and next ICM transmission scheduled for 05/17/2023.

## 2023-04-26 NOTE — Consult Note (Signed)
WOC Nurse requested for preoperative stoma site marking  Discussed surgical procedure and stoma creation with patient and family.  Daughter at bedside.  Explained role of the WOC nurse team.  Provided the patient with educational booklet and provided samples of pouching options.  Answered patient and family questions. Patient lives alone in an apartment and will be performing ostomy care herself.  Discussed twice weekly pouch changes and the outpatient ostomy clinic as an outpatient resource.   Examined patient lying, sitting, and standing in order to place the marking in the patient's visual field, away from any creases or abdominal contour issues and within the rectus muscle. Patient has rounded abdomen that is pendulous at the distal end.  A higher marking is necessary   Marked for colostomy in the LUQ  6 cm to the left of the umbilicus and 6 cm above  the umbilicus.  Marked for ileostomy in the RUQ  6 cm to the right of the umbilicus and 6 cm above the umbilicus.   Patient's abdomen cleansed with CHG wipes at site markings, allowed to air dry prior to marking.Covered mark with thin film transparent dressing to preserve mark until date of surgery.   WOC Nurse team will follow up with patient after surgery for continue ostomy care and teaching.    Mike Gip MSN, RN, FNP-BC CWON Wound, Ostomy, Continence Nurse Outpatient [facility] [phone] Pager [phone]

## 2023-04-26 NOTE — Anesthesia Preprocedure Evaluation (Signed)
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 Left ventricular ejection fraction, by estimation, is 35 to 40%. The  left ventricle has moderately decreased function. The left ventricle  demonstrates regional wall motion abnormalities. Abnormal (paradoxical)  septal motion, consistent with RV  pacemaker     The left ventricular internal cavity size was moderately dilated.  There is mild left ventricular hypertrophy. Left ventricular diastolic  parameters are consistent with Grade I diastolic dysfunction (impaired  relaxation).   2. Right ventricular systolic function is normal. The right ventricular  size is normal.   3. The mitral valve is normal in structure. No evidence of mitral valve  regurgitation. No evidence of mitral stenosis.   4. The aortic valve was not well visualized. Aortic valve regurgitation  is not visualized. No aortic stenosis is present.   5. The inferior vena  cava is normal in size with greater than 50%  respiratory variability, suggesting right atrial pressure of 3 mmHg.     Neuro/Psych neg Seizures PSYCHIATRIC DISORDERS  Depression     Neuromuscular disease (chronic back pain)    GI/Hepatic Neg liver ROS,GERD  Medicated,,Diverticulitis    Endo/Other  diabetes, Type 2Hypothyroidism    Renal/GU CRFRenal disease     Musculoskeletal  (+) Arthritis ,    Abdominal  (+) + obese  Peds  Hematology  (+) Blood dyscrasia, anemia   Anesthesia Other Findings Hyponatremia  Working on IV diuresis    Reproductive/Obstetrics                             Anesthesia Physical Anesthesia Plan  ASA: 4  Anesthesia Plan: General   Post-op Pain Management: Ofirmev IV (intra-op)*   Induction: Intravenous  PONV Risk Score and Plan: 3 and Ondansetron, Dexamethasone and Treatment may vary due to age or medical condition  Airway Management Planned: Oral ETT and Video Laryngoscope Planned  Additional Equipment:   Intra-op Plan:   Post-operative Plan: Extubation in OR and Possible Post-op intubation/ventilation  Informed Consent: I have reviewed the patients History and Physical, chart, labs and discussed the procedure including the risks, benefits and alternatives for the proposed anesthesia with the patient or authorized representative who has indicated his/her understanding and acceptance.   Patient has DNR.  Discussed DNR with patient and Suspend DNR.   Dental advisory given  Plan Discussed with: CRNA and Anesthesiologist  Anesthesia Plan Comments: (Risks of general anesthesia discussed including, but not limited to, sore throat, hoarse voice, chipped/damaged teeth, injury to vocal cords,  nausea and vomiting, allergic reactions, lung infection, heart attack, stroke, and death. All questions answered.  Daughter present for DNR discussion.)       Anesthesia Quick Evaluation

## 2023-04-26 NOTE — Plan of Care (Signed)
  Problem: Pain Management: Goal: General experience of comfort will improve Outcome: Progressing   Problem: Safety: Goal: Ability to remain free from injury will improve Outcome: Progressing

## 2023-04-26 NOTE — Progress Notes (Signed)
   04/26/23 0949  TOC Brief Assessment  Insurance and Status Reviewed  Patient has primary care physician Yes  Home environment has been reviewed from independent living  Prior level of function: independent  Prior/Current Home Services No current home services  Social Determinants of Health Reivew SDOH reviewed no interventions necessary  Readmission risk has been reviewed Yes     TOC will continue to follow for discharge needs

## 2023-04-26 NOTE — Progress Notes (Addendum)
   04/26/23 1425  Mobility  Activity Ambulated with assistance in room  Level of Assistance Contact guard assist, steadying assist  Assistive Device Front wheel walker  Distance Ambulated (ft) 10 ft  Activity Response Tolerated fair  Mobility Referral Yes  $Mobility charge 1 Mobility  Mobility Specialist Start Time (ACUTE ONLY) 1414  Mobility Specialist Stop Time (ACUTE ONLY) 1425  Mobility Specialist Time Calculation (min) (ACUTE ONLY) 11 min   Mobility Specialist: Progress Note  Post Mobility: SpO2 97-100% 3.5L  Pt agreeable to mobility session - received standing in BR with RN present. Required CG throughout using RW. C/o SOB - given pursed lip cues. Returned to bed with all needs met - call bell within reach. Son present.   Barnie Mort, BS Mobility Specialist Please contact via SecureChat or Rehab office at [phone].

## 2023-04-26 NOTE — Progress Notes (Addendum)
Subjective/Chief Complaint: Some ab pain otherwise no changes   Objective: Vital signs in last 24 hours: Temp:  [98.3 F (36.8 C)-98.6 F (37 C)] 98.5 F (36.9 C) (11/18 0405) Pulse Rate:  [69-77] 69 (11/18 0405) Resp:  [17-18] 18 (11/18 0405) BP: (104-123)/(46-69) 104/46 (11/18 0405) SpO2:  [99 %-100 %] 99 % (11/18 0737) Weight:  [84 kg] 84 kg (11/18 0500) Last BM Date : 04/24/23  Intake/Output from previous day: No intake/output data recorded. Intake/Output this shift: No intake/output data recorded.  Ab soft minimally tender lower abdomen  Lab Results:  Recent Labs    04/24/23 0441 04/25/23 0618  WBC 9.2 7.4  HGB 10.3* 9.4*  HCT 32.1* 28.8*  PLT 240 235   BMET Recent Labs    04/24/23 0441 04/25/23 0618  NA 131* 130*  K 3.2* 4.0  CL 94* 95*  CO2 26 24  GLUCOSE 153* 150*  BUN 12 7*  CREATININE 0.88 0.86  CALCIUM 9.2 9.1   PT/INR No results for input(s): "LABPROT", "INR" in the last 72 hours. ABG No results for input(s): "PHART", "HCO3" in the last 72 hours.  Invalid input(s): "PCO2", "PO2"  Studies/Results: No results found.  Anti-infectives: Anti-infectives (From admission, onward)    Start     Dose/Rate Route Frequency Ordered Stop   04/26/23 0800  cefoTEtan (CEFOTAN) 2 g in sodium chloride 0.9 % 100 mL IVPB        2 g 200 mL/hr over 30 Minutes Intravenous To ShortStay Surgical 04/23/23 2111 04/27/23 0800   04/22/23 2300  piperacillin-tazobactam (ZOSYN) IVPB 3.375 g        3.375 g 12.5 mL/hr over 240 Minutes Intravenous Every 8 hours 04/22/23 2256     04/22/23 1900  piperacillin-tazobactam (ZOSYN) IVPB 3.375 g        3.375 g 100 mL/hr over 30 Minutes Intravenous  Once 04/22/23 1852 04/22/23 1954       Assessment/Plan: Sigmoid diverticulitis, complicated 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  She has a sigificant cardiopulmonary risk. The note from Dr Vassie Loll of pulmonary has multiple  recommendations (most of which cannot be met as surgery will be a couple hours, has to be general and get paralytics) and quotes risk of prolonged mech ventilation of 10%. I think this is likely higher with this surgery. I discussed tracheostomy certainly might be possible at some point. Risk of 30 day death, mi or arrest is over 10% also in terms of cardiac risk. There are multiple other risks including bleeeding, infection, injury to bladder or ureters inherent to the surgery. We discussed sigmoid colectomy and permanent diversion with colostomy.  Discussed open wound that would likely take months to heal as well. I think she would need return to quality of life she has now and would need rehab/snf for likely prolonged period of time and might not be able to return to current living situation ever.  I also discussed drains and will ask IR.  This doesn't really solve fistula issue but I suppose one option would be to drain and then just divert her which would be less time in OR.  I think surgery is primary option but holds greatest risk. They are going to discuss today which option they would like to pursue. They understand if drains go in then she certainly still may require surgery I will make NPO after mn tonight and follow up with them later  CHF HTN COPD on O2,  BOOP Afib DM Hypothryoidism CV fistula  Emelia Loron 04/26/2023  Addendum: she and her family have elected to proceed with surgery. I have discussed this with her. And we will proceed tomorrow.

## 2023-04-26 NOTE — Progress Notes (Signed)
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  She had an outpatient CT of abdomen and pelvis done as per Dr. Meridee Score which showed worsening diverticulitis with thickening of bowels, 1.2 cm localized perforation, 5.6 cm perirectal abscess.  On presentation, Tmax was 101.8, sodium was 129.  She was started on IV antibiotics.  General surgery was consulted.  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  Currently on IV Zosyn.  Blood cultures negative so far.   -No temperature spikes over the last 24 hours since  Leukocytosis -Resolved.  Monitor  Hyponatremia -Mild.  Monitor.  Labs pending today.  Hypokalemia -Improved  Persistent A-fib -Xarelto on hold.  Currently on Lovenox.  Currently rate controlled.  Continue Tikosyn  Chronic systolic heart failure -Currently compensated.  Did not tolerate Entresto and beta-blocker due to low blood pressure.  Blood pressure on the lower side.  Cardiology following.  Diuretics as per cardiology.  Strict input and  output.  Daily weights.  Fluid restriction  Chronic respiratory failure with hypoxia COPD  BOOP -Normally wears 4 to 5 L oxygen via nasal cannula.  Continue current inhaled and nebulizer regimen  OSA -On BiPAP at night  Diabetes mellitus type 2 with diabetic neuropathy -Continue gabapentin.  Continue CBGs  Obesity -Outpatient follow-up  Physical deconditioning -PT eval after surgical intervention  Goals of care -Palliative care evaluation appreciated.  CODE STATUS has been changed to DNR.  Overall prognosis is guarded to poor.  Moderate malnutrition -Follow nutrition recommendations   DVT prophylaxis: Lovenox  code Status: DNR Family Communication: Son and daughter at bedside and another son on phone Disposition Plan: Status is: Inpatient Remains inpatient appropriate because: Of severity of illness    Consultants: General surgery.  GI.  cardiology and palliative care  Procedures: None  Antimicrobials:  Anti-infectives (From admission, onward)    Start     Dose/Rate Route Frequency Ordered Stop   04/26/23 0800  cefoTEtan (CEFOTAN) 2 g in sodium chloride 0.9 % 100 mL IVPB        2 g 200 mL/hr over 30 Minutes Intravenous To ShortStay Surgical 04/23/23 2111 04/27/23 0800   04/22/23 2300  piperacillin-tazobactam (ZOSYN) IVPB 3.375 g        3.375 g 12.5 mL/hr over 240 Minutes Intravenous Every 8 hours 04/22/23 2256     04/22/23 1900  piperacillin-tazobactam (ZOSYN) IVPB 3.375 g        3.375 g 100 mL/hr over 30 Minutes Intravenous  Once 04/22/23 1852 04/22/23 1954        Subjective: Patient seen and examined at bedside.  Continues to have intermittent abdominal pain.  Denies chest pain, fever, vomiting. Objective: Vitals:   04/25/23 1952  04/26/23 0405 04/26/23 0500 04/26/23 0737  BP: (!) 123/53 (!) 104/46    Pulse: 70 69    Resp: 18 18    Temp: 98.3 F (36.8 C) 98.5 F (36.9 C)    TempSrc: Oral Oral    SpO2: 100% 100%  99%  Weight:   84 kg   Height:        No intake or output data in the 24 hours ending 04/26/23 0815  Filed Weights   04/24/23 0429 04/25/23 0423 04/26/23 0500  Weight: 82.7 kg 83.5 kg 84 kg    Examination:  General: On 4 L oxygen via nasal cannula.  No distress.  Chronically ill and deconditioned looking. ENT/neck: No palpable thyromegaly or elevated JVD noted respiratory: Decreased breath sounds at bases bilaterally with some crackles  CVS: S1-S2 heard; rate mostly controlled  abdominal: Soft, obese, remains distended and tender and still tender in the lower quadrant; no organomegaly, normal bowel sounds heard  extremities: Mild lower extremity edema present; no cyanosis  CNS: Awake and oriented.  No obvious focal deficits noted  lymph: No lymphadenopathy palpable Skin: No obvious ecchymosis/lesions psych: Flat affect.  Not agitated. musculoskeletal: No obvious joint swelling/deformity     Data Reviewed: I have personally reviewed following labs and imaging studies  CBC: Recent Labs  Lab 04/22/23 1840 04/23/23 0710 04/24/23 0441 04/25/23 0618  WBC 11.9* 9.9 9.2 7.4  NEUTROABS 9.9* 8.1* 7.3 5.9  HGB 11.6* 10.0* 10.3* 9.4*  HCT 36.2 31.8* 32.1* 28.8*  MCV 81.3 82.4 81.7 82.5  PLT 246 202 240 235   Basic Metabolic Panel: Recent Labs  Lab 04/19/23 1534 04/22/23 1840 04/22/23 2211 04/23/23 0710 04/24/23 0441 04/25/23 0618  NA 137 129*  --  131* 131* 130*  K 3.9 3.8  --  4.0 3.2* 4.0  CL 94* 90*  --  95* 94* 95*  CO2 30 26  --  25 26 24   GLUCOSE 164* 162*  --  164* 153* 150*  BUN 20 19  --  14 12 7*  CREATININE 0.81 1.11*  --  0.85 0.88 0.86  CALCIUM 10.5* 9.0  --  8.6* 9.2 9.1  MG 1.7  --  1.9 2.0 2.0 1.8   GFR: Estimated Creatinine Clearance: 53.5 mL/min (by C-G formula based on SCr of 0.86 mg/dL). Liver Function Tests: Recent Labs  Lab 04/22/23 1840 04/23/23 0710 04/24/23 0441  AST 28 19 19   ALT 21 19 20   ALKPHOS 57 44 52  BILITOT 1.2* 1.1 0.8  PROT 6.4* 5.9* 6.2*  ALBUMIN 2.9*  2.6* 2.6*   Recent Labs  Lab 04/22/23 1840  LIPASE 30   No results for input(s): "AMMONIA" in the last 168 hours. Coagulation Profile: Recent Labs  Lab 04/22/23 1840  INR 1.7*   Cardiac Enzymes: No results for input(s): "CKTOTAL", "CKMB", "CKMBINDEX", "TROPONINI" in the last 168 hours. BNP (last 3 results) No results for input(s): "PROBNP" in the last 8760 hours. HbA1C: Recent Labs    04/23/23 2314  HGBA1C 6.2*   CBG: Recent Labs  Lab 04/22/23 2144  GLUCAP 136*   Lipid Profile: No results for input(s): "CHOL", "HDL", "LDLCALC", "TRIG", "CHOLHDL", "LDLDIRECT" in the last 72 hours. Thyroid Function Tests: No results for input(s): "TSH", "T4TOTAL", "FREET4", "T3FREE", "THYROIDAB" in the last 72 hours. Anemia Panel: No results for input(s): "VITAMINB12", "FOLATE", "FERRITIN", "TIBC", "IRON", "RETICCTPCT" in the last 72 hours. Sepsis Labs: Recent Labs  Lab 04/22/23 1905 04/22/23 2211 04/23/23 0114  LATICACIDVEN 1.8 1.1 0.8  Recent Results (from the past 240 hour(s))  Resp panel by RT-PCR (RSV, Flu A&B, Covid) Anterior Nasal Swab     Status: None   Collection Time: 04/22/23  6:38 PM   Specimen: Anterior Nasal Swab  Result Value Ref Range Status   SARS Coronavirus 2 by RT PCR NEGATIVE NEGATIVE Final   Influenza A by PCR NEGATIVE NEGATIVE Final   Influenza B by PCR NEGATIVE NEGATIVE Final    Comment: (NOTE) The Xpert Xpress SARS-CoV-2/FLU/RSV plus assay is intended as an aid in the diagnosis of influenza from Nasopharyngeal swab specimens and should not be used as a sole basis for treatment. Nasal washings and aspirates are unacceptable for Xpert Xpress SARS-CoV-2/FLU/RSV testing.  Fact Sheet for Patients: BloggerCourse.com  Fact Sheet for Healthcare Providers: SeriousBroker.it  This test is not yet approved or cleared by the [place] FDA and has been authorized for detection and/or diagnosis of  SARS-CoV-2 by FDA under an Emergency Use Authorization (EUA). This EUA will remain in effect (meaning this test can be used) for the duration of the COVID-19 declaration under Section 564(b)(1) of the Act, 21 U.S.C. section 360bbb-3(b)(1), unless the authorization is terminated or revoked.     Resp Syncytial Virus by PCR NEGATIVE NEGATIVE Final    Comment: (NOTE) Fact Sheet for Patients: BloggerCourse.com  Fact Sheet for Healthcare Providers: SeriousBroker.it  This test is not yet approved or cleared by the [place] FDA and has been authorized for detection and/or diagnosis of SARS-CoV-2 by FDA under an Emergency Use Authorization (EUA). This EUA will remain in effect (meaning this test can be used) for the duration of the COVID-19 declaration under Section 564(b)(1) of the Act, 21 U.S.C. section 360bbb-3(b)(1), unless the authorization is terminated or revoked.  Performed at [facility], [address], [place], [place] [address]   Blood Culture (routine x 2)     Status: None (Preliminary result)   Collection Time: 04/22/23  6:40 PM   Specimen: BLOOD RIGHT ARM  Result Value Ref Range Status   Specimen Description BLOOD RIGHT ARM  Final   Special Requests   Final    BOTTLES DRAWN AEROBIC AND ANAEROBIC Blood Culture adequate volume   Culture   Final    NO GROWTH 4 DAYS Performed at [facility], [address] [place], [place] [address]    Report Status PENDING  Incomplete  Blood Culture (routine x 2)     Status: None (Preliminary result)   Collection Time: 04/22/23  7:08 PM   Specimen: BLOOD LEFT ARM  Result Value Ref Range Status   Specimen Description BLOOD LEFT ARM  Final   Special Requests   Final    BOTTLES DRAWN AEROBIC AND ANAEROBIC Blood Culture adequate volume   Culture   Final    NO GROWTH 4 DAYS Performed at [facility], [address], [place], [place] [address]    Report Status  PENDING  Incomplete         Radiology Studies: No results found.      Scheduled Meds:  acetaminophen  650 mg Oral Once   docusate sodium  100 mg Oral Daily   dofetilide  250 mcg Oral Q12H   enoxaparin (LOVENOX) injection  80 mg Subcutaneous BID   feeding supplement  1 Container Oral BID BM   fluticasone furoate-vilanterol  1 puff Inhalation Daily   gabapentin  200 mg Oral QHS   multivitamin with minerals  1 tablet Oral Daily  pantoprazole  40 mg Oral Daily   revefenacin  175 mcg Nebulization Daily   Continuous Infusions:  cefoTEtan (CEFOTAN) IV     piperacillin-tazobactam (ZOSYN)  IV 3.375 g (04/26/23 0520)   promethazine (PHENERGAN) injection (IM or IVPB)            Glade Lloyd, MD [facility] 04/26/2023, 8:15 AM

## 2023-04-27 ENCOUNTER — Encounter (HOSPITAL_COMMUNITY): Payer: Self-pay | Admitting: Family Medicine

## 2023-04-27 ENCOUNTER — Inpatient Hospital Stay (HOSPITAL_COMMUNITY): Payer: Self-pay | Admitting: Anesthesiology

## 2023-04-27 ENCOUNTER — Other Ambulatory Visit: Payer: Self-pay

## 2023-04-27 ENCOUNTER — Encounter (HOSPITAL_COMMUNITY)
Admission: EM | Disposition: A | Discharge status: Nursing Home (Includes ICF) | Payer: Self-pay | Source: Home / Self Care | Attending: Internal Medicine

## 2023-04-27 ENCOUNTER — Inpatient Hospital Stay (HOSPITAL_COMMUNITY): Payer: Medicare Other | Admitting: Anesthesiology

## 2023-04-27 ENCOUNTER — Encounter: Payer: Self-pay | Admitting: Cardiology

## 2023-04-27 DIAGNOSIS — K5732 Diverticulitis of large intestine without perforation or abscess without bleeding: Secondary | ICD-10-CM

## 2023-04-27 DIAGNOSIS — K5720 Diverticulitis of large intestine with perforation and abscess without bleeding: Secondary | ICD-10-CM | POA: Diagnosis not present

## 2023-04-27 HISTORY — PX: LAPAROTOMY: SHX154

## 2023-04-27 HISTORY — PX: COLON RESECTION SIGMOID: SHX6737

## 2023-04-27 HISTORY — PX: APPLICATION OF WOUND VAC: SHX5189

## 2023-04-27 LAB — SURGICAL PCR SCREEN
MRSA, PCR: NEGATIVE
Staphylococcus aureus: NEGATIVE

## 2023-04-27 LAB — CULTURE, BLOOD (ROUTINE X 2)
Culture: NO GROWTH
Culture: NO GROWTH
Special Requests: ADEQUATE
Special Requests: ADEQUATE

## 2023-04-27 LAB — POCT I-STAT 7, (LYTES, BLD GAS, ICA,H+H)
Acid-base deficit: 1 mmol/L (ref 0.0–2.0)
Bicarbonate: 25.5 mmol/L (ref 20.0–28.0)
Calcium, Ion: 1.16 mmol/L (ref 1.15–1.40)
HCT: 39 % (ref 36.0–46.0)
Hemoglobin: 13.3 g/dL (ref 12.0–15.0)
O2 Saturation: 95 %
Patient temperature: 36.7
Potassium: 3.9 mmol/L (ref 3.5–5.1)
Sodium: 133 mmol/L — ABNORMAL LOW (ref 135–145)
TCO2: 27 mmol/L (ref 22–32)
pCO2 arterial: 49 mm[Hg] — ABNORMAL HIGH (ref 32–48)
pH, Arterial: 7.323 — ABNORMAL LOW (ref 7.35–7.45)
pO2, Arterial: 79 mm[Hg] — ABNORMAL LOW (ref 83–108)

## 2023-04-27 LAB — MAGNESIUM: Magnesium: 2.2 mg/dL (ref 1.7–2.4)

## 2023-04-27 LAB — GLUCOSE, CAPILLARY
Glucose-Capillary: 160 mg/dL — ABNORMAL HIGH (ref 70–99)
Glucose-Capillary: 204 mg/dL — ABNORMAL HIGH (ref 70–99)
Glucose-Capillary: 209 mg/dL — ABNORMAL HIGH (ref 70–99)

## 2023-04-27 LAB — BASIC METABOLIC PANEL
Anion gap: 8 (ref 5–15)
BUN: 6 mg/dL — ABNORMAL LOW (ref 8–23)
CO2: 27 mmol/L (ref 22–32)
Calcium: 8.4 mg/dL — ABNORMAL LOW (ref 8.9–10.3)
Chloride: 97 mmol/L — ABNORMAL LOW (ref 98–111)
Creatinine, Ser: 0.74 mg/dL (ref 0.44–1.00)
GFR, Estimated: >60 mL/min (ref >60)
Glucose, Bld: 128 mg/dL — ABNORMAL HIGH (ref 70–99)
Potassium: 3.9 mmol/L (ref 3.5–5.1)
Sodium: 132 mmol/L — ABNORMAL LOW (ref 135–145)

## 2023-04-27 LAB — TYPE AND SCREEN
ABO/RH(D): A POS
Antibody Screen: NEGATIVE

## 2023-04-27 SURGERY — LAPAROTOMY, EXPLORATORY
Anesthesia: General | Site: Abdomen

## 2023-04-27 MED ORDER — CHLORHEXIDINE GLUCONATE 0.12 % MT SOLN: 15.0000 mL | Freq: Once | OROMUCOSAL | Status: AC

## 2023-04-27 MED ORDER — NALOXONE HCL 0.4 MG/ML IJ SOLN
0.4000 mg | INTRAMUSCULAR | Status: DC | PRN
Start: 2023-04-27 — End: 2023-04-29

## 2023-04-27 MED ORDER — ORAL CARE MOUTH RINSE: 15.0000 mL | Freq: Once | OROMUCOSAL | Status: AC

## 2023-04-27 MED ORDER — FENTANYL CITRATE (PF) 250 MCG/5ML IJ SOLN
INTRAMUSCULAR | Status: AC
  Filled 2023-04-27: qty 5

## 2023-04-27 MED ORDER — ALBUMIN HUMAN 5 % IV SOLN: INTRAVENOUS | Status: DC | PRN

## 2023-04-27 MED ORDER — ROCURONIUM BROMIDE 10 MG/ML (PF) SYRINGE
PREFILLED_SYRINGE | INTRAVENOUS | Status: DC | PRN
  Administered 2023-04-27: 60 mg via INTRAVENOUS

## 2023-04-27 MED ORDER — ORAL CARE MOUTH RINSE: 15.0000 mL | OROMUCOSAL | Status: DC | PRN

## 2023-04-27 MED ORDER — PHENYLEPHRINE 80 MCG/ML (10ML) SYRINGE FOR IV PUSH (FOR BLOOD PRESSURE SUPPORT)
PREFILLED_SYRINGE | INTRAVENOUS | Status: AC
  Filled 2023-04-27: qty 10

## 2023-04-27 MED ORDER — ACETAMINOPHEN 10 MG/ML IV SOLN
INTRAVENOUS | Status: AC
  Filled 2023-04-27: qty 100

## 2023-04-27 MED ORDER — ONDANSETRON HCL 4 MG/2ML IJ SOLN
INTRAMUSCULAR | Status: AC
  Filled 2023-04-27: qty 2

## 2023-04-27 MED ORDER — SODIUM CHLORIDE 0.9 % IV SOLN
INTRAVENOUS | Status: AC
  Filled 2023-04-27: qty 2

## 2023-04-27 MED ORDER — ORAL CARE MOUTH RINSE
15.0000 mL | OROMUCOSAL | Status: DC
  Administered 2023-04-27 – 2023-05-27 (×92): 15 mL via OROMUCOSAL

## 2023-04-27 MED ORDER — ENOXAPARIN SODIUM 80 MG/0.8ML IJ SOSY: 80.0000 mg | PREFILLED_SYRINGE | Freq: Two times a day (BID) | INTRAMUSCULAR | Status: DC

## 2023-04-27 MED ORDER — PHENYLEPHRINE 80 MCG/ML (10ML) SYRINGE FOR IV PUSH (FOR BLOOD PRESSURE SUPPORT)
PREFILLED_SYRINGE | INTRAVENOUS | Status: DC | PRN
  Administered 2023-04-27: 80 ug via INTRAVENOUS

## 2023-04-27 MED ORDER — PROPOFOL 10 MG/ML IV BOLUS
INTRAVENOUS | Status: AC
Start: 2023-04-27 — End: ?
  Filled 2023-04-27: qty 20

## 2023-04-27 MED ORDER — LIDOCAINE 2% (20 MG/ML) 5 ML SYRINGE
INTRAMUSCULAR | Status: DC | PRN
  Administered 2023-04-27: 100 mg via INTRAVENOUS

## 2023-04-27 MED ORDER — 0.9 % SODIUM CHLORIDE (POUR BTL) OPTIME
TOPICAL | Status: DC | PRN
  Administered 2023-04-27: 2000 mL

## 2023-04-27 MED ORDER — LACTATED RINGERS IV SOLN: INTRAVENOUS | Status: DC

## 2023-04-27 MED ORDER — SUCCINYLCHOLINE CHLORIDE 200 MG/10ML IV SOSY
PREFILLED_SYRINGE | INTRAVENOUS | Status: AC
  Filled 2023-04-27: qty 10

## 2023-04-27 MED ORDER — FENTANYL CITRATE (PF) 100 MCG/2ML IJ SOLN
INTRAMUSCULAR | Status: AC
  Filled 2023-04-27: qty 2

## 2023-04-27 MED ORDER — CHLORHEXIDINE GLUCONATE 0.12 % MT SOLN
OROMUCOSAL | Status: AC
  Administered 2023-04-27: 15 mL via OROMUCOSAL
  Filled 2023-04-27: qty 15

## 2023-04-27 MED ORDER — DIPHENHYDRAMINE HCL 12.5 MG/5ML PO ELIX: 12.5000 mg | ORAL_SOLUTION | Freq: Four times a day (QID) | ORAL | Status: DC | PRN

## 2023-04-27 MED ORDER — PANTOPRAZOLE SODIUM 40 MG IV SOLR
40.0000 mg | INTRAVENOUS | Status: DC
  Administered 2023-04-27 – 2023-05-02 (×6): 40 mg via INTRAVENOUS
  Filled 2023-04-27 (×6): qty 10

## 2023-04-27 MED ORDER — HYDROMORPHONE HCL 1 MG/ML IJ SOLN
INTRAMUSCULAR | Status: AC
  Filled 2023-04-27: qty 1

## 2023-04-27 MED ORDER — DIPHENHYDRAMINE HCL 50 MG/ML IJ SOLN
12.5000 mg | Freq: Four times a day (QID) | INTRAMUSCULAR | Status: DC | PRN
  Administered 2023-04-27 – 2023-04-29 (×4): 12.5 mg via INTRAVENOUS
  Filled 2023-04-27 (×4): qty 1

## 2023-04-27 MED ORDER — DEXAMETHASONE SODIUM PHOSPHATE 10 MG/ML IJ SOLN
INTRAMUSCULAR | Status: DC | PRN
  Administered 2023-04-27: 5 mg via INTRAVENOUS

## 2023-04-27 MED ORDER — ONDANSETRON HCL 4 MG/2ML IJ SOLN
INTRAMUSCULAR | Status: DC | PRN
  Administered 2023-04-27: 4 mg via INTRAVENOUS

## 2023-04-27 MED ORDER — SUGAMMADEX SODIUM 200 MG/2ML IV SOLN
INTRAVENOUS | Status: DC | PRN
  Administered 2023-04-27: 200 mg via INTRAVENOUS

## 2023-04-27 MED ORDER — AMISULPRIDE (ANTIEMETIC) 5 MG/2ML IV SOLN
INTRAVENOUS | Status: AC
  Filled 2023-04-27: qty 4

## 2023-04-27 MED ORDER — NOREPINEPHRINE 4 MG/250ML-% IV SOLN
INTRAVENOUS | Status: DC | PRN
  Administered 2023-04-27: 2 ug/min via INTRAVENOUS

## 2023-04-27 MED ORDER — HYDROMORPHONE 1 MG/ML IV SOLN
INTRAVENOUS | Status: DC
  Administered 2023-04-27: 30 mg via INTRAVENOUS
  Administered 2023-04-29: 1.2 mg via INTRAVENOUS
  Administered 2023-04-29: 0.4 mg via INTRAVENOUS
  Filled 2023-04-27: qty 30

## 2023-04-27 MED ORDER — FENTANYL CITRATE (PF) 250 MCG/5ML IJ SOLN
INTRAMUSCULAR | Status: DC | PRN
  Administered 2023-04-27 (×2): 25 ug via INTRAVENOUS
  Administered 2023-04-27 (×3): 50 ug via INTRAVENOUS

## 2023-04-27 MED ORDER — SODIUM CHLORIDE 0.9% FLUSH
9.0000 mL | INTRAVENOUS | Status: DC | PRN
Start: 2023-04-27 — End: 2023-04-29

## 2023-04-27 MED ORDER — SODIUM CHLORIDE 0.9 % IV SOLN
INTRAVENOUS | Status: DC | PRN
  Administered 2023-04-27: 2 g via INTRAVENOUS

## 2023-04-27 MED ORDER — EPHEDRINE 5 MG/ML INJ
INTRAVENOUS | Status: AC
  Filled 2023-04-27: qty 5

## 2023-04-27 MED ORDER — AMISULPRIDE (ANTIEMETIC) 5 MG/2ML IV SOLN
10.0000 mg | Freq: Once | INTRAVENOUS | Status: AC | PRN
  Administered 2023-04-27: 10 mg via INTRAVENOUS

## 2023-04-27 MED ORDER — DEXAMETHASONE SODIUM PHOSPHATE 10 MG/ML IJ SOLN
INTRAMUSCULAR | Status: AC
  Filled 2023-04-27: qty 1

## 2023-04-27 MED ORDER — HYDROMORPHONE HCL 1 MG/ML IJ SOLN: 0.5000 mg | INTRAMUSCULAR | Status: DC | PRN

## 2023-04-27 MED ORDER — ROCURONIUM BROMIDE 10 MG/ML (PF) SYRINGE
PREFILLED_SYRINGE | INTRAVENOUS | Status: AC
  Filled 2023-04-27: qty 10

## 2023-04-27 MED ORDER — ACETAMINOPHEN 10 MG/ML IV SOLN
1000.0000 mg | Freq: Four times a day (QID) | INTRAVENOUS | Status: DC
  Administered 2023-04-27 – 2023-04-28 (×2): 1000 mg via INTRAVENOUS
  Filled 2023-04-27 (×5): qty 100

## 2023-04-27 MED ORDER — SODIUM CHLORIDE 0.9 % IV SOLN: INTRAVENOUS | Status: AC | PRN

## 2023-04-27 MED ORDER — METHOCARBAMOL 1000 MG/10ML IJ SOLN
500.0000 mg | Freq: Three times a day (TID) | INTRAMUSCULAR | Status: DC
  Administered 2023-04-27 – 2023-05-03 (×18): 500 mg via INTRAVENOUS
  Filled 2023-04-27 (×21): qty 5

## 2023-04-27 MED ORDER — LIDOCAINE 2% (20 MG/ML) 5 ML SYRINGE
INTRAMUSCULAR | Status: AC
  Filled 2023-04-27: qty 5

## 2023-04-27 MED ORDER — PROPOFOL 10 MG/ML IV BOLUS
INTRAVENOUS | Status: DC | PRN
  Administered 2023-04-27: 130 mg via INTRAVENOUS

## 2023-04-27 MED ORDER — HYDROMORPHONE HCL 1 MG/ML IJ SOLN
0.2500 mg | INTRAMUSCULAR | Status: DC | PRN
  Administered 2023-04-27 (×4): 0.25 mg via INTRAVENOUS
  Administered 2023-04-27 (×2): 0.5 mg via INTRAVENOUS

## 2023-04-27 MED ORDER — CHLORHEXIDINE GLUCONATE CLOTH 2 % EX PADS
6.0000 | MEDICATED_PAD | Freq: Every day | CUTANEOUS | Status: DC
  Administered 2023-04-29 – 2023-05-27 (×30): 6 via TOPICAL

## 2023-04-27 MED ORDER — ACETAMINOPHEN 10 MG/ML IV SOLN
1000.0000 mg | Freq: Once | INTRAVENOUS | Status: AC
  Administered 2023-04-27: 1000 mg via INTRAVENOUS

## 2023-04-27 MED ORDER — WHITE PETROLATUM EX OINT
TOPICAL_OINTMENT | CUTANEOUS | Status: AC
  Filled 2023-04-27: qty 28.35

## 2023-04-27 SURGICAL SUPPLY — 68 items
BAG COUNTER SPONGE SURGICOUNT (BAG) ×2 IMPLANT
BIOPATCH RED 1 DISK 7.0 (GAUZE/BANDAGES/DRESSINGS) IMPLANT
BLADE CLIPPER SURG (BLADE) IMPLANT
CANISTER SUCT 3000ML PPV (MISCELLANEOUS) ×2 IMPLANT
CANISTER WOUNDNEG PRESSURE 500 (CANNISTER) IMPLANT
CHLORAPREP W/TINT 26 (MISCELLANEOUS) ×2 IMPLANT
COVER MAYO STAND STRL (DRAPES) ×4 IMPLANT
COVER SURGICAL LIGHT HANDLE (MISCELLANEOUS) ×2 IMPLANT
DRAIN CHANNEL 19F RND (DRAIN) IMPLANT
DRAPE HALF SHEET 40X57 (DRAPES) ×4 IMPLANT
DRAPE LAPAROSCOPIC ABDOMINAL (DRAPES) ×2 IMPLANT
DRAPE UTILITY XL STRL (DRAPES) ×2 IMPLANT
DRAPE WARM FLUID 44X44 (DRAPES) ×2 IMPLANT
DRSG OPSITE POSTOP 4X10 (GAUZE/BANDAGES/DRESSINGS) IMPLANT
DRSG OPSITE POSTOP 4X8 (GAUZE/BANDAGES/DRESSINGS) IMPLANT
DRSG VAC GRANUFOAM MED (GAUZE/BANDAGES/DRESSINGS) IMPLANT
ELECT BLADE 6.5 EXT (BLADE) ×2 IMPLANT
ELECT CAUTERY BLADE 6.4 (BLADE) ×4 IMPLANT
ELECT REM PT RETURN 9FT ADLT (ELECTROSURGICAL) ×2
ELECTRODE REM PT RTRN 9FT ADLT (ELECTROSURGICAL) ×2 IMPLANT
EVACUATOR SILICONE 100CC (DRAIN) IMPLANT
GLOVE BIO SURGEON STRL SZ7 (GLOVE) ×8 IMPLANT
GLOVE BIOGEL PI IND STRL 7.5 (GLOVE) ×4 IMPLANT
GOWN STRL REUS W/ TWL LRG LVL3 (GOWN DISPOSABLE) ×12 IMPLANT
GOWN STRL REUS W/TWL LRG LVL3 (GOWN DISPOSABLE) ×12
HANDLE SUCTION POOLE (INSTRUMENTS) ×2 IMPLANT
HEMOSTAT ARISTA ABSORB 3G PWDR (HEMOSTASIS) IMPLANT
KIT BASIN OR (CUSTOM PROCEDURE TRAY) ×2 IMPLANT
KIT OSTOMY DRAINABLE 2.75 STR (WOUND CARE) IMPLANT
KIT SIGMOIDOSCOPE (SET/KITS/TRAYS/PACK) ×2 IMPLANT
KIT TURNOVER KIT B (KITS) ×2 IMPLANT
LEGGING LITHOTOMY PAIR STRL (DRAPES) ×2 IMPLANT
LIGASURE IMPACT 36 18CM CVD LR (INSTRUMENTS) IMPLANT
NS IRRIG 1000ML POUR BTL (IV SOLUTION) ×4 IMPLANT
PACK GENERAL/GYN (CUSTOM PROCEDURE TRAY) ×2 IMPLANT
PAD ARMBOARD 7.5X6 YLW CONV (MISCELLANEOUS) ×2 IMPLANT
PENCIL SMOKE EVACUATOR (MISCELLANEOUS) ×2 IMPLANT
RELOAD PROXIMATE 75MM BLUE (ENDOMECHANICALS) ×2 IMPLANT
RELOAD STAPLE 75 3.8 BLU REG (ENDOMECHANICALS) IMPLANT
RELOAD STAPLER LINE PROX 60 GR (STAPLE) IMPLANT
SPECIMEN JAR LARGE (MISCELLANEOUS) IMPLANT
SPECIMEN JAR X LARGE (MISCELLANEOUS) ×2 IMPLANT
SPONGE T-LAP 18X18 ~~LOC~~+RFID (SPONGE) IMPLANT
STAPLER CVD CUT GN 40 RELOAD (ENDOMECHANICALS) ×2 IMPLANT
STAPLER CVD CUT GRN 40 RELOAD (ENDOMECHANICALS) IMPLANT
STAPLER PROXIMATE 75MM BLUE (STAPLE) IMPLANT
STAPLER RELOAD LINE PROX 60 GR (STAPLE)
STAPLER RELOADABLE 60 GRN THCK (STAPLE) IMPLANT
STAPLER VISISTAT 35W (STAPLE) ×2 IMPLANT
SUCTION POOLE HANDLE (INSTRUMENTS) ×2
SURGILUBE 2OZ TUBE FLIPTOP (MISCELLANEOUS) ×2 IMPLANT
SUT PDS AB 1 TP1 96 (SUTURE) ×4 IMPLANT
SUT PROLENE 2 0 CT2 30 (SUTURE) IMPLANT
SUT PROLENE 2 0 KS (SUTURE) IMPLANT
SUT SILK 2 0 SH CR/8 (SUTURE) ×2 IMPLANT
SUT SILK 2 0 TIES 10X30 (SUTURE) ×2 IMPLANT
SUT SILK 2-0 18XBRD TIE 12 (SUTURE) ×2 IMPLANT
SUT SILK 3 0 SH CR/8 (SUTURE) ×2 IMPLANT
SUT SILK 3 0 TIES 10X30 (SUTURE) ×2 IMPLANT
SUT SILK 3-0 18XBRD TIE 12 (SUTURE) ×2 IMPLANT
SUT VIC AB 3-0 SH 18 (SUTURE) IMPLANT
SUT VIC AB 3-0 SH 27X BRD (SUTURE) IMPLANT
SYR BULB IRRIG 60ML STRL (SYRINGE) ×2 IMPLANT
TOWEL GREEN STERILE (TOWEL DISPOSABLE) ×4 IMPLANT
TRAY FOLEY MTR SLVR 14FR STAT (SET/KITS/TRAYS/PACK) ×2 IMPLANT
TRAY FOLEY MTR SLVR 16FR STAT (SET/KITS/TRAYS/PACK) ×2 IMPLANT
TUBE CONNECTING 12X1/4 (SUCTIONS) ×2 IMPLANT
YANKAUER SUCT BULB TIP NO VENT (SUCTIONS) ×2 IMPLANT

## 2023-04-27 NOTE — Anesthesia Postprocedure Evaluation (Signed)
Anesthesia Post Note  Patient: Kathleen [name]  Procedure(s) Performed: EXPLORATORY LAPAROTOMY COLON RESECTION SIGMOID WITH COLOSTOMY APPLICATION OF WOUND VAC (Abdomen)     Patient location during evaluation: PACU Anesthesia Type: General Level of consciousness: awake Pain management: pain level controlled Vital Signs Assessment: post-procedure vital signs reviewed and stable Respiratory status: spontaneous breathing, nonlabored ventilation and respiratory function stable Cardiovascular status: blood pressure returned to baseline and stable Postop Assessment: no apparent nausea or vomiting Anesthetic complications: no   No notable events documented.  Last Vitals:  Vitals:   04/27/23 1430 04/27/23 1445  BP: 109/71 (!) 112/53  Pulse: 70 70  Resp: (!) 23 17  Temp:    SpO2: 100% 93%    Last Pain:  Vitals:   04/27/23 1430  TempSrc:   PainSc: 8                  Linton Rump

## 2023-04-27 NOTE — Progress Notes (Signed)
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  She had an outpatient CT of abdomen and pelvis done as per Dr. Meridee Score which showed worsening diverticulitis with thickening of bowels, 1.2 cm localized perforation, 5.6 cm perirectal abscess.  On presentation, Tmax was 101.8, sodium was 129.  She was started on IV antibiotics.  General surgery was consulted.  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  Currently on IV Zosyn.  Blood cultures negative so far.    Leukocytosis -Resolved.  Monitor  Hyponatremia -Mild.  Monitor.  Labs pending today.  Hypokalemia -Improved  Persistent A-fib -Xarelto on hold.  Currently on Lovenox.  Currently rate controlled.  Continue Tikosyn  Chronic systolic heart failure -Currently compensated.  Did not tolerate Entresto and beta-blocker due to low blood pressure.  Blood pressure on the lower side.  Cardiology following.  Diuretics as per cardiology.  Strict input and output.  Daily weights.  Fluid restriction  Chronic respiratory  failure with hypoxia COPD  BOOP -Normally wears 4 to 5 L oxygen via nasal cannula.  Continue current inhaled and nebulizer regimen  OSA -On BiPAP at night  Diabetes mellitus type 2 with diabetic neuropathy -Continue gabapentin.  Continue CBGs  Obesity -Outpatient follow-up  Physical deconditioning -PT eval after surgical intervention  Goals of care -Palliative care evaluation appreciated.  CODE STATUS has been changed to DNR.  Overall prognosis is guarded to poor.  Moderate malnutrition -Follow nutrition recommendations   DVT prophylaxis: Lovenox  code Status: DNR Family Communication: daughter at bedside  Disposition Plan: Status is: Inpatient Remains inpatient appropriate because: Of severity of illness    Consultants: General surgery.  GI.  cardiology and palliative care  Procedures: None  Antimicrobials:  Anti-infectives (From admission, onward)    Start     Dose/Rate Route Frequency Ordered Stop   04/26/23 0800  cefoTEtan (CEFOTAN) 2 g in sodium chloride 0.9 % 100 mL IVPB        2 g 200 mL/hr over 30 Minutes Intravenous To [facility] 04/23/23 2111 04/27/23 0800   04/22/23 2300  piperacillin-tazobactam (ZOSYN) IVPB 3.375 g        3.375 g 12.5 mL/hr over 240 Minutes Intravenous Every 8 hours 04/22/23 2256     04/22/23 1900  piperacillin-tazobactam (ZOSYN) IVPB 3.375 g        3.375 g 100 mL/hr over 30 Minutes Intravenous  Once 04/22/23 1852 04/22/23 1954        Subjective: Patient seen and examined at bedside.  No fever, worsening shortness of breath or chest pain reported.  Continues to have intermittent lower abdominal pain.   Objective: Vitals:   04/26/23 0500 04/26/23 0737 04/26/23 1945 04/26/23 2245  BP:   [name])  94/47 (!) 104/56  Pulse:   69 70  Resp:   17 18  Temp:   98.6 F (37 C)   TempSrc:   Oral   SpO2:  99% 100% 100%  Weight: 84 kg     Height:        Intake/Output Summary (Last 24 hours) at 04/27/2023 0736 Last data filed at  04/27/2023 0400 Gross per 24 hour  Intake 900 ml  Output 300 ml  Net 600 ml    Filed Weights   04/24/23 0429 04/25/23 0423 04/26/23 0500  Weight: 82.7 kg 83.5 kg 84 kg    Examination:  General: Currently in no distress.  Remains on 4 L oxygen by nasal cannula.  Chronically ill and deconditioned looking. ENT/neck: No obvious JVD elevation or palpable thyromegaly noted  respiratory: Bilateral decreased breath sounds at bases with scattered crackles CVS: Currently rate controlled; S1 and S2 are heard  abdominal: Soft, obese, still distended and tender in the lower quadrant; no organomegaly, bowel sounds are heard  extremities: No clubbing; bilateral lower extremity edema present CNS: Alert and awake.  No focal deficits noted  lymph: No cervical lymphadenopathy palpable Skin: No obvious rashes/lesions  psych: Mostly flat affect.  Showing no signs of agitation.   Musculoskeletal: No obvious joint tenderness/erythema     Data Reviewed: I have personally reviewed following labs and imaging studies  CBC: Recent Labs  Lab 04/22/23 1840 04/23/23 0710 04/24/23 0441 04/25/23 0618 04/26/23 0859  WBC 11.9* 9.9 9.2 7.4 8.1  NEUTROABS 9.9* 8.1* 7.3 5.9 5.9  HGB 11.6* 10.0* 10.3* 9.4* 9.6*  HCT 36.2 31.8* 32.1* 28.8* 31.2*  MCV 81.3 82.4 81.7 82.5 82.5  PLT 246 202 240 235 269   Basic Metabolic Panel: Recent Labs  Lab 04/23/23 0710 04/24/23 0441 04/25/23 0618 04/26/23 0859 04/26/23 1751  NA 131* 131* 130* 129* 131*  K 4.0 3.2* 4.0 3.2* 3.8  CL 95* 94* 95* 94* 91*  CO2 25 26 24 24 28   GLUCOSE 164* 153* 150* 158* 138*  BUN 14 12 7* 8 7*  CREATININE 0.85 0.88 0.86 1.19* 0.86  CALCIUM 8.6* 9.2 9.1 9.2 9.2  MG 2.0 2.0 1.8 1.8 2.7*   GFR: Estimated Creatinine Clearance: 53.5 mL/min (by C-G formula based on SCr of 0.86 mg/dL). Liver Function Tests: Recent Labs  Lab 04/22/23 1840 04/23/23 0710 04/24/23 0441  AST 28 19 19   ALT 21 19 20   ALKPHOS 57 44 52  BILITOT 1.2*  1.1 0.8  PROT 6.4* 5.9* 6.2*  ALBUMIN 2.9* 2.6* 2.6*   Recent Labs  Lab 04/22/23 1840  LIPASE 30   No results for input(s): "AMMONIA" in the last 168 hours. Coagulation Profile: Recent Labs  Lab 04/22/23 1840  INR 1.7*   Cardiac Enzymes: No results for input(s): "CKTOTAL", "CKMB", "CKMBINDEX", "TROPONINI" in the last 168 hours. BNP (last 3 results) No results for input(s): "PROBNP" in the last 8760 hours. HbA1C: No results for input(s): "HGBA1C" in the last 72 hours.  CBG: Recent Labs  Lab 04/22/23 2144  GLUCAP 136*   Lipid Profile: No results for input(s): "CHOL", "HDL", "LDLCALC", "TRIG", "CHOLHDL", "LDLDIRECT" in the last 72 hours. Thyroid Function Tests: No results for input(s): "TSH", "T4TOTAL", "FREET4", "T3FREE", "THYROIDAB" in the last 72 hours. Anemia Panel: No results for input(s): "VITAMINB12", "FOLATE", "FERRITIN", "TIBC", "IRON", "RETICCTPCT" in the last 72 hours. Sepsis Labs: Recent Labs  Lab 04/22/23 1905 04/22/23 2211 04/23/23 0114  LATICACIDVEN 1.8 1.1 0.8    Recent Results (  from the past 240 hour(s))  Resp panel by RT-PCR (RSV, Flu A&B, Covid) Anterior Nasal Swab     Status: None   Collection Time: 04/22/23  6:38 PM   Specimen: Anterior Nasal Swab  Result Value Ref Range Status   SARS Coronavirus 2 by RT PCR NEGATIVE NEGATIVE Final   Influenza A by PCR NEGATIVE NEGATIVE Final   Influenza B by PCR NEGATIVE NEGATIVE Final    Comment: (NOTE) The Xpert Xpress SARS-CoV-2/FLU/RSV plus assay is intended as an aid in the diagnosis of influenza from Nasopharyngeal swab specimens and should not be used as a sole basis for treatment. Nasal washings and aspirates are unacceptable for Xpert Xpress SARS-CoV-2/FLU/RSV testing.  Fact Sheet for Patients: BloggerCourse.com  Fact Sheet for Healthcare Providers: SeriousBroker.it  This test is not yet approved or cleared by the [place] FDA and has  been authorized for detection and/or diagnosis of SARS-CoV-2 by FDA under an Emergency Use Authorization (EUA). This EUA will remain in effect (meaning this test can be used) for the duration of the COVID-19 declaration under Section 564(b)(1) of the Act, 21 U.S.C. section 360bbb-3(b)(1), unless the authorization is terminated or revoked.     Resp Syncytial Virus by PCR NEGATIVE NEGATIVE Final    Comment: (NOTE) Fact Sheet for Patients: BloggerCourse.com  Fact Sheet for Healthcare Providers: SeriousBroker.it  This test is not yet approved or cleared by the [place] FDA and has been authorized for detection and/or diagnosis of SARS-CoV-2 by FDA under an Emergency Use Authorization (EUA). This EUA will remain in effect (meaning this test can be used) for the duration of the COVID-19 declaration under Section 564(b)(1) of the Act, 21 U.S.C. section 360bbb-3(b)(1), unless the authorization is terminated or revoked.  Performed at [facility], [address] [place], [place] [address]   Blood Culture (routine x 2)     Status: None (Preliminary result)   Collection Time: 04/22/23  6:40 PM   Specimen: BLOOD RIGHT ARM  Result Value Ref Range Status   Specimen Description BLOOD RIGHT ARM  Final   Special Requests   Final    BOTTLES DRAWN AEROBIC AND ANAEROBIC Blood Culture adequate volume   Culture   Final    NO GROWTH 4 DAYS Performed at [facility], [address], [place], [place] [address]    Report Status PENDING  Incomplete  Blood Culture (routine x 2)     Status: None (Preliminary result)   Collection Time: 04/22/23  7:08 PM   Specimen: BLOOD LEFT ARM  Result Value Ref Range Status   Specimen Description BLOOD LEFT ARM  Final   Special Requests   Final    BOTTLES DRAWN AEROBIC AND ANAEROBIC Blood Culture adequate volume   Culture   Final    NO GROWTH 4 DAYS Performed at [facility], [address] [place], [place] [address]    Report Status PENDING  Incomplete  Surgical PCR screen     Status: None   Collection Time: 04/27/23 12:21 AM   Specimen: Nasal Mucosa; Nasal Swab  Result Value Ref Range Status   MRSA, PCR NEGATIVE NEGATIVE Final   Staphylococcus aureus NEGATIVE NEGATIVE Final    Comment: (NOTE) The Xpert SA Assay (FDA approved for NASAL specimens in patients 30 years of age and older), is one component of a comprehensive surveillance program. It is not intended to diagnose infection nor to guide or monitor treatment. Performed at [facility], [address] [place], [place] [address]          Radiology Studies: No results found.      Scheduled Meds:  acetaminophen  650 mg Oral Once   docusate sodium  100 mg Oral Daily   dofetilide  250 mcg Oral Q12H   enoxaparin (LOVENOX) injection  80 mg Subcutaneous BID   feeding supplement  1 Container Oral BID BM   fluticasone furoate-vilanterol  1 puff Inhalation Daily   gabapentin  200 mg Oral QHS   multivitamin with minerals  1 tablet Oral Daily   pantoprazole  40 mg Oral Daily   revefenacin  175 mcg Nebulization Daily   Continuous Infusions:  cefoTEtan (CEFOTAN) IV     piperacillin-tazobactam (ZOSYN)  IV 3.375 g (04/27/23 0522)   promethazine (PHENERGAN) injection (IM or IVPB)            Glade Lloyd, MD [facility] 04/27/2023, 7:36 AM

## 2023-04-27 NOTE — Progress Notes (Addendum)
Patient Name: Kathleen [name] Date of Encounter: 04/27/2023 [facility] Cardiologist: Marca Ancona, MD   Interval Summary  .    Seems more tired and weaker today.  However, she states that she is not short of breath and feels comfortable otherwise. Vital Signs .    Vitals:   04/26/23 0500 04/26/23 0737 04/26/23 1945 04/26/23 2245  BP:   (!) 94/47 (!) 104/56  Pulse:   69 70  Resp:   17 18  Temp:   98.6 F (37 C)   TempSrc:   Oral   SpO2:  99% 100% 100%  Weight: 84 kg     Height:        Intake/Output Summary (Last 24 hours) at 04/27/2023 0751 Last data filed at 04/27/2023 0400 Gross per 24 hour  Intake 900 ml  Output 300 ml  Net 600 ml      04/26/2023    5:00 AM 04/25/2023    4:23 AM 04/24/2023    4:29 AM  Last 3 Weights  Weight (lbs) 185 lb 3 oz 184 lb 1.4 oz 182 lb 5.1 oz  Weight (kg) 84 kg 83.5 kg 82.7 kg      Telemetry/ECG    A/V paced 70s- Personally Reviewed  CV Studies    Echocardiogram 09/15/2022 1. Left ventricular ejection fraction, by estimation, is 35 to 40%. The  left ventricle has moderately decreased function. The left ventricle  demonstrates regional wall motion abnormalities. Abnormal (paradoxical)  septal motion, consistent with RV  pacemaker     The left ventricular internal cavity size was moderately dilated.  There is mild left ventricular hypertrophy. Left ventricular diastolic  parameters are consistent with Grade I diastolic dysfunction (impaired  relaxation).   2. Right ventricular systolic function is normal. The right ventricular  size is normal.   3. The mitral valve is normal in structure. No evidence of mitral valve  regurgitation. No evidence of mitral stenosis.   4. The aortic valve was not well visualized. Aortic valve regurgitation  is not visualized. No aortic stenosis is present.   5. The inferior vena cava is normal in size with greater than 50%  respiratory variability, suggesting right atrial pressure of  3 mmHg.  Physical Exam .   GEN: No acute distress.   Neck: no JVD Cardiac: RRR, no murmurs, rubs, or gallops.  Respiratory: CTAB GI: Soft, nontender, distended  MS: mild edema  Patient Profile    Kathleen [name] is a 77 y.o. female has hx of   chronic HFrEF, nonischemic cardiomyopathy, atrial fibrillation status post ablation 2015, Medtronic PPM 2017, COPD chronically on supplemental oxygen 4 L, OSA on CPAP, BOOP tracheobronchomalacia, recurrent diverticulitis, colovesical fistula  and admitted on 04/22/2023.  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  Assessment & Plan .     Preoperative evaluation recurrent sigmoid diverticulitis with colovaginal fistula and abscess Generally stable from a heart failure standpoint with EF persistently 35 to 40%.  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 RCRI 10.1 % 30-day risk of death, MI, or cardiac arrest. Likely cannot complete greater than 4 METS, but functionally independent.  Overall procedure seems to be high risk however seems to be the only option.  Planning for procedure today.    Appears stable today to proceed with procedure.  Not having any orthopnea or shortness of breath.  Lung sounds much better.  Renal function has also improved based off yesterday's afternoon labs.  Her blood pressure has been persistently low which is concerning.  Will need very close monitoring of this as well as volume status postoperatively.   Chronic HFrEF 35-40% Nonischemic cardiomyopathy Normal coronaries as above.  April echo shows EF 35 to 40% normal RV function.  Volume up.  BP is soft.    IV Lasix was increased to 80 mg yesterday.  Volume status looks better despite no significant diuresis.  I do not think I's and O's are being documented accurately.   Will hold Lasix for today, will likely need it  postoperatively.  Continue holding spironolactone.  Did not tolerate Entresto due to low BP.  Titrate GDMT once more stable.   Atrial fibrillation status post AV nodal ablation Medtronic PPM Intermittent A/V paced.  QTc 410 on 11/14.  Monitor QTc.  Maintain electrolyte balance potassium greater than 4, magnesium greater than 2.  Will need close monitoring for postoperative A-fib.  QTc stable on repeat EKG yesterday. Continue Tikosyn, Lovenox for now, transition to Xarelto when able.   COPD chronically on 4 L OSA on CPAP BOOP with tracheobronchomalacia Anemia For questions or updates, please contact [facility] Please consult [email or web address] for contact info under        Signed, Abagail Kitchens, PA-C    Pt in OR before I could see patient    WIll plan to follow in postop period Continue telemetry post op   With Tikosyn follow K and magnesium closely     Dietrich Pates MD

## 2023-04-27 NOTE — Transfer of Care (Signed)
Immediate Anesthesia Transfer of Care Note  Patient: Kathleen [name]  Procedure(s) Performed: EXPLORATORY LAPAROTOMY COLON RESECTION SIGMOID WITH COLOSTOMY APPLICATION OF WOUND VAC (Abdomen)  Patient Location: PACU  Anesthesia Type:General  Level of Consciousness: awake and alert   Airway & Oxygen Therapy: Patient Spontanous Breathing and Patient connected to face mask oxygen  Post-op Assessment: Report given to RN and Post -op Vital signs reviewed and stable  Post vital signs: Reviewed and stable  Last Vitals:  Vitals Value Taken Time  BP 119/62 04/27/23 1415  Temp    Pulse 69 04/27/23 1416  Resp 22 04/27/23 1416  SpO2 100 % 04/27/23 1416  Vitals shown include unfiled device data.  Last Pain:  Vitals:   04/27/23 1040  TempSrc:   PainSc: 7       Patients Stated Pain Goal: 2 (04/25/23 2030)  Complications: No notable events documented.

## 2023-04-27 NOTE — Anesthesia Procedure Notes (Signed)
Arterial Line Insertion Performed by: Linton Rump, MD  Patient location: Pre-op. Preanesthetic checklist: patient identified, IV checked, site marked, risks and benefits discussed, surgical consent, monitors and equipment checked, pre-op evaluation, timeout performed and anesthesia consent Lidocaine 1% used for infiltration Left, radial was placed Catheter size: 20 G Hand hygiene performed  and maximum sterile barriers used   Attempts: 1 Procedure performed using ultrasound guided (Image not saved) technique. Ultrasound Notes:anatomy identified, needle tip was noted to be adjacent to the nerve/plexus identified and no ultrasound evidence of intravascular and/or intraneural injection Following insertion, dressing applied and Biopatch. Post procedure assessment: normal and unchanged  Patient tolerated the procedure well with no immediate complications. Additional procedure comments: Unable to get arterial line. Dr. Charlynn Grimes was able to get arterial line.Kathleen [name]

## 2023-04-27 NOTE — Consult Note (Signed)
[facility] Liaison Consult Note    04/27/2023  DANYELLE KEAL 28-Nov-1945 [id number]  Primary Care Provider:  [name], MD with [place] at [facility] which is listed to provide the [facility] follow up and calls.   Patient is currently active with Care Management for chronic disease management services.  Patient has been engaged by a Tourist information centre manager.  Our community based plan of care has focused on disease management and community resource support.   Patient will receive a post hospital call and will be evaluated for assessments and disease process education.   Plan: Will update community active social worker on pt's pending disposition.  Inpatient Transition Of Care [TOC] team member to make aware that Care Management following.  Of note, Care Management services does not replace or interfere with any services that are needed or arranged by inpatient [facility] care management team.   For additional questions or referrals please contact:  Elliot Cousin, RN, [facility] Liaison    [facility], Population [facility] Office Hours MTWF  8:00 am-6:00 pm Direct Dial: [phone] mobile [phone] [Office toll free line] Office Hours are M-F 8:30 - 5 pm [email or web address]

## 2023-04-27 NOTE — Progress Notes (Signed)
PERIOPERATIVE PRESCRIPTION FOR IMPLANTED CARDIAC DEVICE PROGRAMMING  Patient Information: Name:  Kathleen [name]  DOB:  13-Apr-1946  MRN:  [id number]    Planned Procedure:  Exploratory Laparotomy and Colon Resection  Surgeon:  Dr. Emelia Loron  Date of Procedure:  04/27/2023  Cautery will be used.  Position during surgery:  Supine   Please send documentation back to:  [facility] (Fax # [phone])   Device Information:  Clinic EP Physician:  Loman Brooklyn, MD   Device Type:  Pacemaker Manufacturer and Phone #:  Medtronic: [phone] Pacemaker Dependent?:  Yes.   Date of Last Device Check:  05/18/2022 in office Dr. Elberta Fortis Normal Device Function?:  Yes.    Electrophysiologist's Recommendations:  Have magnet available. Provide continuous ECG monitoring when magnet is used or reprogramming is to be performed.  Procedure will likely interfere with device function.  Device should be programmed:  Asynchronous pacing during procedure and returned to normal programming after procedure  Per Device Clinic Standing Orders, Kizzie Ide, RN  9:22 AM 04/27/2023

## 2023-04-27 NOTE — Op Note (Addendum)
Preoperative diagnosis: Complicated diverticulitis with colovaginal fistula Postoperative diagnosis: Same as above Procedure: Exploratory laparotomy with sigmoid colectomy and end left colostomy Surgeon: Dr. Harden Mo Assistant: Barnetta Chapel, PA-C Anesthesia: General Complications: None Drains: The lateral 19 [place] Blake drain is going into the right perirectal space, the medial Blake drain is going into the pelvis Specimens: Sigmoid colon to pathology Sponge and count was correct completion Disposition recovery stable addition  Indications: This is a 77 year old female who has had complicated diverticulitis for some time.  She has multiple abscesses.  She also has a colovaginal fistula.  She has some significant risk of surgery given her cardiac and pulmonary comorbidities.  She has been seen by both cardiac and pulmonary prior to surgery.  We had a long conversation with her family and she decided she would like to proceed with surgery.  Procedure: After informed consent was obtained she was taken to the operating room.  She was given antibiotics.  SCDs were in place.  She was placed under general anesthesia without complication.  An arterial line and a central venous line were placed.  She was then prepped and draped in a sterile sterile surgical fashion.  Timeout was performed.  I made a generous midline incision.  I entered into the abdomen without difficulty.  She had her sigmoid colon  that was adherent to her abdominal wall and there was a epiploic appendix that was marginal that look like this was the area on the CAT scan in her left lower quadrant that was abnormal.  We then placed a Bookwalter retractor.  The sigmoid colon was very thickened from the start.  The left colon was soft.  I then dissected down to the pelvis.  This was very adherent to all the surrounding structures.  I was able to get into this perirectal space with my hand.  There was purulence that was present.  I  drained this completely.  I was able to go down using a combination of blunt dissection cautery and posteriorly as the LigaSure device to take this down as far as I could safely.  I then anteriorly broke some of the adhesions up with my hand where the colovaginal fistula was.  There was leakage of stool when I did this.  I was able to get my hand down more distal and there was no spot where the colon appeared to be softer at that point.  I did not think that given her comorbidities and the time it would take to do the operation that it would be beneficial to her to continue down to remove all this disease.  The main problem was a colovaginal fistula and the complicated diverticulitis with the associated abscesses.  I feel like I had already taken care of those at that point and I elected to complete the operation at this point.  I was unable to staple across the high rectum.  The stapler did not fit as this it was so thickened.  The actual colon was fairly soft when I eventually I divided it with the electrocautery.  It was the surrounding tissue that was really concreted to the pelvis.  Once I done this there a rectum was open.  I oversewed this with 2-0 silk sutures.  The hole distally there was no more stool that was coming out. I was unable to really see any other anatomy in her pelvis but stayed immediately on the colon and did much of this bluntly so I don't think there should  be any ureteral or bladder injury. I do not think there is anything to do with this now that she is diverted.  I irrigated this.  I placed a 63 [place] Blake drain through and this is the lateral drain through the abdominal wall and I placed this down deep in the pelvis on the right side.  I also placed a 56 [place] Blake drain that is in the medial abdominal wall that goes into the pelvis and up the left gutter.  The sigmoid colon and left colon were then mobilized.  She had a low splenic flexure so I did mobilize the splenic flexure at  the same time.  I used the Loraine Leriche made prior to surgery to bring the ostomy up.  This reached without difficulty and was viable.  I then placed the omentum overlying the viscera.  The retractor had been removed.  I then closed the fascia with #1 looped PDS.  Eventually a negative pressure dressing was placed.  We then matured the ostomy after cutting off the staple line with 3-0 Vicryl suture.  An appliance was placed.  She tolerated this well and was transferred recovery. She was extubated She did have some serosanguinous fluid from her vagina and urine is just blood tinged which is not expected given surgery and retraction.

## 2023-04-27 NOTE — Progress Notes (Addendum)
[facility] Liaison note:  This patient is currently enrolled in [facility] outpatient-based palliative care. Hospital Liaison will continue to follow for discharge disposition. Please call for any outpatient based palliative care related questions or concerns. Thank you, Thea Gist, BSN RN Hospice hospital liaison  [phone]

## 2023-04-27 NOTE — Plan of Care (Signed)
  Problem: Coping: Goal: Ability to adjust to condition or change in health will improve Outcome: Progressing   Problem: Health Behavior/Discharge Planning: Goal: Ability to identify and utilize available resources and services will improve Outcome: Progressing   Problem: Metabolic: Goal: Ability to maintain appropriate glucose levels will improve Outcome: Progressing   Problem: Nutritional: Goal: Maintenance of adequate nutrition will improve Outcome: Progressing   Problem: Skin Integrity: Goal: Risk for impaired skin integrity will decrease Outcome: Progressing   Problem: Education: Goal: Knowledge of General Education information will improve Description: Including pain rating scale, medication(s)/side effects and non-pharmacologic comfort measures Outcome: Progressing   Problem: Clinical Measurements: Goal: Will remain free from infection Outcome: Progressing   Problem: Activity: Goal: Risk for activity intolerance will decrease Outcome: Progressing   Problem: Nutrition: Goal: Adequate nutrition will be maintained Outcome: Progressing   Problem: Coping: Goal: Level of anxiety will decrease Outcome: Progressing   Problem: Pain Management: Goal: General experience of comfort will improve Outcome: Progressing   Problem: Safety: Goal: Ability to remain free from injury will improve Outcome: Progressing   Problem: Activity: Goal: Ability to tolerate increased activity will improve Outcome: Progressing   Problem: Respiratory: Goal: Respiratory status will improve Outcome: Progressing

## 2023-04-27 NOTE — Anesthesia Procedure Notes (Signed)
    Central Venous Catheter Insertion Performed by: Linton Rump, MD, anesthesiologist Start/End11/19/2024 12:00 PM, 04/27/2023 12:10 PM Patient location: OR. Preanesthetic checklist: patient identified, IV checked, site marked, risks and benefits discussed, surgical consent, monitors and equipment checked, pre-op evaluation, timeout performed and anesthesia consent Position: Trendelenburg Patient sedated Hand hygiene performed  and maximum sterile barriers used  Catheter size: 8 Fr Total catheter length 16. Central line was placed.Double lumen Procedure performed using ultrasound guided technique. Ultrasound Notes:anatomy identified, needle tip was noted to be adjacent to the nerve/plexus identified, no ultrasound evidence of intravascular and/or intraneural injection and image(s) printed for medical record Attempts: 1 Following insertion, dressing applied, line sutured and Biopatch. Post procedure assessment: blood return through all ports, free fluid flow and no air  Patient tolerated the procedure well with no immediate complications.

## 2023-04-27 NOTE — Consult Note (Signed)
WOC Nurse Consult Note: WOC consult requested for new colostomy and Vac dressing.  Pt went to the OR today and is currently in the perioperative setting. WOC team will perform a consult tomorrow.  Thank-you,  Cammie Mcgee MSN, RN, CWOCN, [place], CNS [phone]

## 2023-04-27 NOTE — Progress Notes (Signed)
Day of Surgery   Subjective/Chief Complaint: Coughing this am, bp low overnight   Objective: Vital signs in last 24 hours: Temp:  [98.6 F (37 C)] 98.6 F (37 C) (11/18 1945) Pulse Rate:  [69-70] 70 (11/18 2245) Resp:  [17-18] 18 (11/18 2245) BP: (94-104)/(47-56) 104/56 (11/18 2245) SpO2:  [100 %] 100 % (11/18 2245) Last BM Date : 04/26/23  Intake/Output from previous day: 11/18 0701 - 11/19 0700 In: 900 [P.O.:600; IV Piggyback:300] Out: 300 [Urine:300] Intake/Output this shift: No intake/output data recorded.  Ab soft approp tender  Lab Results:  Recent Labs    04/25/23 0618 04/26/23 0859  WBC 7.4 8.1  HGB 9.4* 9.6*  HCT 28.8* 31.2*  PLT 235 269   BMET Recent Labs    04/26/23 0859 04/26/23 1751  NA 129* 131*  K 3.2* 3.8  CL 94* 91*  CO2 24 28  GLUCOSE 158* 138*  BUN 8 7*  CREATININE 1.19* 0.86  CALCIUM 9.2 9.2   PT/INR No results for input(s): "LABPROT", "INR" in the last 72 hours. ABG No results for input(s): "PHART", "HCO3" in the last 72 hours.  Invalid input(s): "PCO2", "PO2"  Studies/Results: No results found.  Anti-infectives: Anti-infectives (From admission, onward)    Start     Dose/Rate Route Frequency Ordered Stop   04/26/23 0800  cefoTEtan (CEFOTAN) 2 g in sodium chloride 0.9 % 100 mL IVPB        2 g 200 mL/hr over 30 Minutes Intravenous To [facility] 04/23/23 2111 04/27/23 0800   04/22/23 2300  piperacillin-tazobactam (ZOSYN) IVPB 3.375 g        3.375 g 12.5 mL/hr over 240 Minutes Intravenous Every 8 hours 04/22/23 2256     04/22/23 1900  piperacillin-tazobactam (ZOSYN) IVPB 3.375 g        3.375 g 100 mL/hr over 30 Minutes Intravenous  Once 04/22/23 1852 04/22/23 1954       Assessment/Plan: Sigmoid diverticulitis, complicated Likely colovaginal fistula -see yesterday conversation. 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

## 2023-04-27 NOTE — Anesthesia Procedure Notes (Signed)
Procedure Name: Intubation Date/Time: 04/27/2023 11:48 AM  Performed by: Camillia Herter, CRNAPre-anesthesia Checklist: Patient identified, Emergency Drugs available, Suction available and Patient being monitored Patient Re-evaluated:Patient Re-evaluated prior to induction Oxygen Delivery Method: Circle System Utilized Preoxygenation: Pre-oxygenation with 100% oxygen Induction Type: IV induction Ventilation: Mask ventilation without difficulty Tube type: Oral Number of attempts: 1 Airway Equipment and Method: Stylet and Video-laryngoscopy Placement Confirmation: ETT inserted through vocal cords under direct vision, positive ETCO2 and breath sounds checked- equal and bilateral Secured at: 21 cm Tube secured with: Tape Dental Injury: Teeth and Oropharynx as per pre-operative assessment

## 2023-04-28 ENCOUNTER — Encounter (HOSPITAL_COMMUNITY): Payer: Self-pay | Admitting: General Surgery

## 2023-04-28 ENCOUNTER — Telehealth (HOSPITAL_COMMUNITY): Payer: Self-pay | Admitting: Licensed Clinical Social Worker

## 2023-04-28 DIAGNOSIS — Z515 Encounter for palliative care: Secondary | ICD-10-CM | POA: Diagnosis not present

## 2023-04-28 DIAGNOSIS — J9611 Chronic respiratory failure with hypoxia: Secondary | ICD-10-CM | POA: Diagnosis not present

## 2023-04-28 DIAGNOSIS — I5042 Chronic combined systolic (congestive) and diastolic (congestive) heart failure: Secondary | ICD-10-CM | POA: Diagnosis not present

## 2023-04-28 DIAGNOSIS — K5720 Diverticulitis of large intestine with perforation and abscess without bleeding: Secondary | ICD-10-CM | POA: Diagnosis not present

## 2023-04-28 LAB — GLUCOSE, CAPILLARY
Glucose-Capillary: 181 mg/dL — ABNORMAL HIGH (ref 70–99)
Glucose-Capillary: 172 mg/dL — ABNORMAL HIGH (ref 70–99)
Glucose-Capillary: 152 mg/dL — ABNORMAL HIGH (ref 70–99)

## 2023-04-28 LAB — CBC WITH DIFFERENTIAL/PLATELET
Abs Immature Granulocytes: 0.58 10*3/uL — ABNORMAL HIGH (ref 0.00–0.07)
Basophils Absolute: 0.1 10*3/uL (ref 0.0–0.1)
Basophils Relative: 0 %
Eosinophils Absolute: 0 10*3/uL (ref 0.0–0.5)
Eosinophils Relative: 0 %
HCT: 29.4 % — ABNORMAL LOW (ref 36.0–46.0)
Hemoglobin: 9.3 g/dL — ABNORMAL LOW (ref 12.0–15.0)
Immature Granulocytes: 3 %
Lymphocytes Relative: 2 %
Lymphs Abs: 0.4 10*3/uL — ABNORMAL LOW (ref 0.7–4.0)
MCH: 25.9 pg — ABNORMAL LOW (ref 26.0–34.0)
MCHC: 31.6 g/dL (ref 30.0–36.0)
MCV: 81.9 fL (ref 80.0–100.0)
Monocytes Absolute: 0.8 10*3/uL (ref 0.1–1.0)
Monocytes Relative: 4 %
Neutro Abs: 16.9 10*3/uL — ABNORMAL HIGH (ref 1.7–7.7)
Neutrophils Relative %: 91 %
Platelets: 333 10*3/uL (ref 150–400)
RBC: 3.59 MIL/uL — ABNORMAL LOW (ref 3.87–5.11)
RDW: 15.2 % (ref 11.5–15.5)
WBC: 18.7 10*3/uL — ABNORMAL HIGH (ref 4.0–10.5)
nRBC: 0.2 % (ref 0.0–0.2)

## 2023-04-28 LAB — BASIC METABOLIC PANEL
Anion gap: 9 (ref 5–15)
BUN: 6 mg/dL — ABNORMAL LOW (ref 8–23)
CO2: 25 mmol/L (ref 22–32)
Calcium: 8.2 mg/dL — ABNORMAL LOW (ref 8.9–10.3)
Chloride: 100 mmol/L (ref 98–111)
Creatinine, Ser: 0.83 mg/dL (ref 0.44–1.00)
GFR, Estimated: >60 mL/min (ref >60)
Glucose, Bld: 206 mg/dL — ABNORMAL HIGH (ref 70–99)
Potassium: 4 mmol/L (ref 3.5–5.1)
Sodium: 134 mmol/L — ABNORMAL LOW (ref 135–145)

## 2023-04-28 LAB — MAGNESIUM: Magnesium: 2.2 mg/dL (ref 1.7–2.4)

## 2023-04-28 MED ORDER — ENOXAPARIN SODIUM 40 MG/0.4ML IJ SOSY
40.0000 mg | PREFILLED_SYRINGE | INTRAMUSCULAR | Status: DC
  Administered 2023-04-28 – 2023-05-01 (×4): 40 mg via SUBCUTANEOUS
  Filled 2023-04-28 (×4): qty 0.4

## 2023-04-28 MED ORDER — ACETAMINOPHEN 10 MG/ML IV SOLN
1000.0000 mg | Freq: Once | INTRAVENOUS | Status: AC
  Administered 2023-04-28: 1000 mg via INTRAVENOUS
  Filled 2023-04-28: qty 100

## 2023-04-28 NOTE — Evaluation (Signed)
Occupational Therapy Evaluation Patient Details Name: Kathleen [name] MRN: [id number] DOB: 03/29/1946 Today's Date: 04/28/2023   History of Present Illness 77 y.o. 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 S/p exploratory laparotomy with sigmoid colectomy and end left colostomy by Dr. Dwain Sarna on 04/27/2023.  PMHx: colovaginal fistula associated with diverticulitis, obesity, OSA , BOOP, tracheobronchomalacia, chronic respiratory failure on 4 to 5 L oxygen at baseline, persistent A-fib S/P pacemaker implant 2017, chronic systolic heart failure, recent admission from 04/09/2023 to 04/13/2023.   Clinical Impression   Pt resides at [facility]. She uses a rollator outside of her home, walks without AD inside. She is typically mod I in self care. She receives one meal and housekeeping through the ILF, her son does her grocery shopping. Pt present with significant abdominal pain, generalized weakness, poor balance and decreased activity tolerance. She requires +2 mod assist for bed mobility and +2 min assist for transfers with RW. Pt is currently NPO. She needs set up to total assist for ADLs. Patient will benefit from continued inpatient follow up therapy, <3 hours/day.      If plan is discharge home, recommend the following: A little help with walking and/or transfers;A lot of help with bathing/dressing/bathroom;Assistance with cooking/housework;Assist for transportation;Help with stairs or ramp for entrance    Functional Status Assessment  Patient has had a recent decline in their functional status and demonstrates the ability to make significant improvements in function in a reasonable and predictable amount of time.  Equipment Recommendations  None recommended by OT    Recommendations for Other Services       Precautions / Restrictions Precautions Precautions: Fall Precaution Comments: multiple drains, NG, wound vac Restrictions Weight  Bearing Restrictions: No Other Position/Activity Restrictions: Hx fragile skin reported in chart      Mobility Bed Mobility Overal bed mobility: Needs Assistance Bed Mobility: Rolling, Sidelying to Sit Rolling: Mod assist, +2 for physical assistance, Used rails Sidelying to sit: Mod assist, +2 for safety/equipment, HOB elevated, Used rails       General bed mobility comments: Mod assist +2 for rolling to EOB, and guidance with LEs down to floor, trunk support to rise, limited by pain.    Transfers Overall transfer level: Needs assistance Equipment used: Rolling walker (2 wheels) Transfers: Sit to/from Stand, Bed to chair/wheelchair/BSC Sit to Stand: Min assist, +2 safety/equipment     Step pivot transfers: Min assist, +2 safety/equipment     General transfer comment: Min assist +2 for safety with cues for hand placement, and boost to stand, slight assist for balance due to posterior lean initiatlly and again when stepping towards recilner wtih RW for support. +2 assisted with management of multiple lines/leasd.      Balance Overall balance assessment: Needs assistance   Sitting balance-Leahy Scale: Poor Sitting balance - Comments: guarded, bracing with hands EOB for comfort   Standing balance support: Bilateral upper extremity supported Standing balance-Leahy Scale: Poor                             ADL either performed or assessed with clinical judgement   ADL Overall ADL's : Needs assistance/impaired Eating/Feeding: NPO   Grooming: Set up;Sitting   Upper Body Bathing: Maximal assistance;Sitting   Lower Body Bathing: Total assistance;Sit to/from stand   Upper Body Dressing : Maximal assistance;Sitting   Lower Body Dressing: Total assistance;Sit to/from stand   Toilet Transfer: Minimal assistance;+2 for safety/equipment;Rolling walker (  2 wheels) Toilet Transfer Details (indicate cue type and reason): simultated to chair Toileting- Clothing  Manipulation and Hygiene: Total assistance;Sit to/from stand         General ADL Comments: pt limited by pain and multiple lines     Vision Baseline Vision/History: 1 Wears glasses Ability to See in Adequate Light: 0 Adequate Patient Visual Report: No change from baseline       Perception         Praxis         Pertinent Vitals/Pain Pain Assessment Pain Assessment: Faces Faces Pain Scale: Hurts worst Pain Location: abdomen Pain Descriptors / Indicators: Operative site guarding, Crying, Grimacing, Guarding, Moaning Pain Intervention(s): Repositioned, Monitored during session, PCA encouraged     Extremity/Trunk Assessment Upper Extremity Assessment Upper Extremity Assessment: Overall WFL for tasks assessed;Generalized weakness   Lower Extremity Assessment Lower Extremity Assessment: Defer to PT evaluation   Cervical / Trunk Assessment Cervical / Trunk Assessment: Other exceptions (abdominal incisions, pain)   Communication Communication Communication: No apparent difficulties   Cognition Arousal: Alert Behavior During Therapy: WFL for tasks assessed/performed Overall Cognitive Status: Within Functional Limits for tasks assessed                                       General Comments  HR 81, SpO2 100-95% on 4L at rest and mobilizing respectively; BP 132/57 in bed, followed by 113/65 in recliner with HR at 82.    Exercises     Shoulder Instructions      Home Living Family/patient expects to be discharged to:: Private residence Living Arrangements: Alone Available Help at Discharge: Family;Available PRN/intermittently Type of Home: Independent living facility [facility]) Home Access: Elevator     Home Layout: One level     Bathroom Shower/Tub: Producer, television/film/video: Handicapped height Bathroom Accessibility: Yes How Accessible: Accessible via walker Home Equipment: Rollator (4 wheels);Cane - single point;Shower seat;Grab bars  - tub/shower;Hand held shower head;Other (comment);Adaptive equipment Adaptive Equipment: Reacher;Long-handled sponge Additional Comments: 4L O2 at baseline      Prior Functioning/Environment Prior Level of Function : Independent/Modified Independent             Mobility Comments: walking with rollator outside of her home ADLs Comments: independent, reports is sedentary but completes adls and cooking without assistance. Pt has one meal provided per day, but prefers to cook her own food. Uses reacher for laundry. Housekeeping services but working on frequency. Son delivers groceries.        OT Problem List: Decreased strength;Impaired balance (sitting and/or standing);Decreased activity tolerance;Decreased knowledge of use of DME or AE;Pain      OT Treatment/Interventions: Self-care/ADL training;Therapeutic activities;Patient/family education;Energy conservation;DME and/or AE instruction;Balance training    OT Goals(Current goals can be found in the care plan section) Acute Rehab OT Goals OT Goal Formulation: With patient Time For Goal Achievement: 05/12/23 Potential to Achieve Goals: Good ADL Goals Pt Will Perform Grooming: with contact guard assist;standing Pt Will Perform Upper Body Bathing: with min assist;sitting Pt Will Perform Upper Body Dressing: with supervision;sitting Pt Will Transfer to Toilet: with contact guard assist;ambulating;bedside commode Pt Will Perform Toileting - Clothing Manipulation and hygiene: with min assist;sit to/from stand Additional ADL Goal #1: Pt will complete bed mobility using log roll technique with min assist in preparation for ADLs.  OT Frequency: Min 1X/week    Co-evaluation PT/OT/SLP Co-Evaluation/Treatment: Yes Reason for Co-Treatment:  Complexity of the patient's impairments (multi-system involvement);To address functional/ADL transfers;For patient/therapist safety PT goals addressed during session: Mobility/safety with  mobility;Balance;Proper use of DME OT goals addressed during session: ADL's and self-care      AM-PAC OT "6 Clicks" Daily Activity     Outcome Measure Help from another person eating meals?: None Help from another person taking care of personal grooming?: A Little Help from another person toileting, which includes using toliet, bedpan, or urinal?: Total Help from another person bathing (including washing, rinsing, drying)?: Total Help from another person to put on and taking off regular upper body clothing?: A Lot Help from another person to put on and taking off regular lower body clothing?: Total 6 Click Score: 12   End of Session Equipment Utilized During Treatment: Oxygen;Rolling walker (2 wheels) Nurse Communication: Mobility status;Other (comment) (aware her drains are pinned to her gown, SCDs malfunctioning)  Activity Tolerance: Patient limited by pain Patient left: in chair;with call bell/phone within reach;with family/visitor present  OT Visit Diagnosis: Unsteadiness on feet (R26.81);Other abnormalities of gait and mobility (R26.89);Muscle weakness (generalized) (M62.81);Pain                Time: 1015-1050 OT Time Calculation (min): 35 min Charges:  OT General Charges $OT Visit: 1 Visit OT Evaluation $OT Eval Moderate Complexity: 1 Mod  Berna Spare, OTR/L Acute Rehabilitation Services Office: [phone]  Evern Bio 04/28/2023, 12:22 PM

## 2023-04-28 NOTE — Plan of Care (Signed)
  Problem: Education: Goal: Ability to describe self-care measures that may prevent or decrease complications (Diabetes Survival Skills Education) will improve Outcome: Progressing Goal: Individualized Educational Video(s) Outcome: Progressing   Problem: Coping: Goal: Ability to adjust to condition or change in health will improve Outcome: Progressing   Problem: Fluid Volume: Goal: Ability to maintain a balanced intake and output will improve Outcome: Progressing   Problem: Health Behavior/Discharge Planning: Goal: Ability to identify and utilize available resources and services will improve Outcome: Progressing Goal: Ability to manage health-related needs will improve Outcome: Progressing   Problem: Metabolic: Goal: Ability to maintain appropriate glucose levels will improve Outcome: Progressing   Problem: Nutritional: Goal: Maintenance of adequate nutrition will improve Outcome: Progressing Goal: Progress toward achieving an optimal weight will improve Outcome: Progressing   Problem: Skin Integrity: Goal: Risk for impaired skin integrity will decrease Outcome: Progressing   Problem: Tissue Perfusion: Goal: Adequacy of tissue perfusion will improve Outcome: Progressing   Problem: Education: Goal: Knowledge of General Education information will improve Description: Including pain rating scale, medication(s)/side effects and non-pharmacologic comfort measures Outcome: Progressing   Problem: Health Behavior/Discharge Planning: Goal: Ability to manage health-related needs will improve Outcome: Progressing   Problem: Clinical Measurements: Goal: Ability to maintain clinical measurements within normal limits will improve Outcome: Progressing Goal: Will remain free from infection Outcome: Progressing Goal: Diagnostic test results will improve Outcome: Progressing Goal: Cardiovascular complication will be avoided Outcome: Progressing   Problem: Activity: Goal: Risk  for activity intolerance will decrease Outcome: Progressing   Problem: Nutrition: Goal: Adequate nutrition will be maintained Outcome: Progressing   Problem: Coping: Goal: Level of anxiety will decrease Outcome: Progressing   Problem: Elimination: Goal: Will not experience complications related to bowel motility Outcome: Progressing Goal: Will not experience complications related to urinary retention Outcome: Progressing   Problem: Pain Management: Goal: General experience of comfort will improve Outcome: Progressing   Problem: Safety: Goal: Ability to remain free from injury will improve Outcome: Progressing   Problem: Skin Integrity: Goal: Risk for impaired skin integrity will decrease Outcome: Progressing   Problem: Education: Goal: Understanding of discharge needs will improve Outcome: Progressing Goal: Verbalization of understanding of the causes of altered bowel function will improve Outcome: Progressing   Problem: Activity: Goal: Ability to tolerate increased activity will improve Outcome: Progressing   Problem: Bowel/Gastric: Goal: Gastrointestinal status for postoperative course will improve Outcome: Progressing   Problem: Health Behavior/Discharge Planning: Goal: Identification of community resources to assist with postoperative recovery needs will improve Outcome: Progressing   Problem: Nutritional: Goal: Will attain and maintain optimal nutritional status will improve Outcome: Progressing   Problem: Clinical Measurements: Goal: Postoperative complications will be avoided or minimized Outcome: Progressing   Problem: Respiratory: Goal: Respiratory status will improve Outcome: Progressing   Problem: Skin Integrity: Goal: Will show signs of wound healing Outcome: Progressing

## 2023-04-28 NOTE — Progress Notes (Signed)
1 Day Post-Op   Subjective/Chief Complaint: Awake, alert, pain controlled, discussed surgery   Objective: Vital signs in last 24 hours: Temp:  [97.2 F (36.2 C)-98.5 F (36.9 C)] 97.5 F (36.4 C) (11/20 0749) Pulse Rate:  [69-80] 71 (11/20 0800) Resp:  [14-24] 19 (11/20 0800) BP: (69-134)/(43-109) 130/52 (11/20 0800) SpO2:  [93 %-100 %] 99 % (11/20 0800) Arterial Line BP: (82-139)/(49-65) 82/65 (11/19 1545) FiO2 (%):  [32 %] 32 % (11/20 0758) Weight:  [79.4 kg] 79.4 kg (11/19 1028) Last BM Date : 04/26/23  Intake/Output from previous day: 11/19 0701 - 11/20 0700 In: 1848.1 [I.V.:1020; IV Piggyback:748.1] Out: 1478 [Urine:905; Emesis/NG output:200; Drains:273; Blood:100] Intake/Output this shift: Total I/O In: 25 [IV Piggyback:25] Out: -   Ab soft approp tender vac in place jps serosang  Lab Results:  Recent Labs    04/26/23 0859 04/27/23 1332 04/28/23 0442  WBC 8.1  --  18.7*  HGB 9.6* 13.3 9.3*  HCT 31.2* 39.0 29.4*  PLT 269  --  333   BMET Recent Labs    04/27/23 0750 04/27/23 1332 04/28/23 0442  NA 132* 133* 134*  K 3.9 3.9 4.0  CL 97*  --  100  CO2 27  --  25  GLUCOSE 128*  --  206*  BUN 6*  --  6*  CREATININE 0.74  --  0.83  CALCIUM 8.4*  --  8.2*   PT/INR No results for input(s): "LABPROT", "INR" in the last 72 hours. ABG Recent Labs    04/27/23 1332  PHART 7.323*  HCO3 25.5    Studies/Results: No results found.  Anti-infectives: Anti-infectives (From admission, onward)    Start     Dose/Rate Route Frequency Ordered Stop   04/27/23 1021  sodium chloride 0.9 % with cefoTEtan (CEFOTAN) ADS Med       Note to Pharmacy: Shanda Bumps M: cabinet override      04/27/23 1021 04/27/23 1253   04/26/23 0800  cefoTEtan (CEFOTAN) 2 g in sodium chloride 0.9 % 100 mL IVPB  Status:  Discontinued        2 g 200 mL/hr over 30 Minutes Intravenous To ShortStay Surgical 04/23/23 2111 04/27/23 0800   04/22/23 2300  piperacillin-tazobactam (ZOSYN)  IVPB 3.375 g        3.375 g 12.5 mL/hr over 240 Minutes Intravenous Every 8 hours 04/22/23 2256     04/22/23 1900  piperacillin-tazobactam (ZOSYN) IVPB 3.375 g        3.375 g 100 mL/hr over 30 Minutes Intravenous  Once 04/22/23 1852 04/22/23 1954       Assessment/Plan: POD 1 elap, sigmoid colectomy, colostomy- MW -discussed surgery -expect some drainage from vagina -will leave ng tube and foley another 24 hours -npo except meds, ice chips -five days zosyn postop -Cr normal, Hct lower as expected will follow in am -do not want to start therapeutic lovenox today, can do prophylactic which I will write for -can tx out of icu from my standpoing  Kathleen [name] 04/28/2023

## 2023-04-28 NOTE — Consult Note (Signed)
WOC Nurse ostomy consult note She has a surgery yesterday, did a colostomy.  Stoma is without efluent, with edema, red. The pouch is intact with a good seal. As well the [facility]  WOC is planning to change both on Friday. Supplies already order to the room.  Ostomy pouching: 2pc.   Silvestre Moment Acquanetta Sit WOC nurse

## 2023-04-28 NOTE — Evaluation (Signed)
Physical Therapy Evaluation Patient Details Name: Kathleen [name] MRN: [id number] DOB: 1946-05-28 Today's Date: 04/28/2023  History of Present Illness  77 y.o. 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 S/p exploratory laparotomy with sigmoid colectomy and end left colostomy by Dr. Dwain Sarna on 04/27/2023.  PMHx: colovaginal fistula associated with diverticulitis, obesity, OSA , BOOP, tracheobronchomalacia, chronic respiratory failure on 4 to 5 L oxygen at baseline, persistent A-fib S/P pacemaker implant 2017, chronic systolic heart failure, recent admission from 04/09/2023 to 04/13/2023.   Clinical Impression  Pt admitted with above diagnosis. PTA, pt sedentary but ambulatory at ILF, utilizing a rollator to ambulate. Currently requires up to mod assist +2 for bed mobility, min assist +2 for transfers out of bed and with small steps to recliner using RW for support. Mainly limited by pain (PCA and relaxation techniques encouraged.) Daughter present and supportive. Patient will benefit from continued inpatient follow up therapy, <3 hours/day.  Pt currently with functional limitations due to the deficits listed below (see PT Problem List). Pt will benefit from acute skilled PT to increase their independence and safety with mobility to allow discharge.           If plan is discharge home, recommend the following: A lot of help with walking and/or transfers;A lot of help with bathing/dressing/bathroom;Assistance with cooking/housework;Assist for transportation   Can travel by private vehicle   No (likely soon)    Equipment Recommendations None recommended by PT  Recommendations for Other Services       Functional Status Assessment Patient has had a recent decline in their functional status and demonstrates the ability to make significant improvements in function in a reasonable and predictable amount of time.     Precautions / Restrictions  Precautions Precautions: Fall Precaution Comments: multiple drains, NG, wound vac Restrictions Weight Bearing Restrictions: No Other Position/Activity Restrictions: Hx fragile skin reported in chart      Mobility  Bed Mobility Overal bed mobility: Needs Assistance Bed Mobility: Rolling, Sidelying to Sit Rolling: Mod assist, +2 for physical assistance, Used rails Sidelying to sit: Mod assist, +2 for safety/equipment, HOB elevated, Used rails       General bed mobility comments: Mod assist +2 for rolling to EOB, and guidance with LEs down to floor, trunk support to rise, limited by pain.    Transfers Overall transfer level: Needs assistance Equipment used: Rolling walker (2 wheels) Transfers: Sit to/from Stand, Bed to chair/wheelchair/BSC Sit to Stand: Min assist, +2 safety/equipment   Step pivot transfers: Min assist, +2 safety/equipment       General transfer comment: Min assist +2 for safety with cues for hand placement, and boost to stand, slight assist for balance due to posterior lean initiatlly and again when stepping towards recilner wtih RW for support. +2 assisted with management of multiple lines/leasd.    Ambulation/Gait                  Stairs            Wheelchair Mobility     Tilt Bed    Modified Rankin (Stroke Patients Only)       Balance Overall balance assessment: Needs assistance Sitting-balance support: Single extremity supported, Feet supported Sitting balance-Leahy Scale: Poor Sitting balance - Comments: guarded, bracing with hands EOB for comfort   Standing balance support: Bilateral upper extremity supported Standing balance-Leahy Scale: Poor  Pertinent Vitals/Pain Pain Assessment Pain Assessment: Faces Faces Pain Scale: Hurts worst (with movement.) Pain Location: abdomen Pain Descriptors / Indicators: Operative site guarding, Crying, Grimacing, Guarding, Moaning Pain  Intervention(s): Monitored during session, Repositioned, Limited activity within patient's tolerance, PCA encouraged, Premedicated before session, Utilized relaxation techniques    Home Living Family/patient expects to be discharged to:: Private residence Living Arrangements: Alone Available Help at Discharge: Family;Available PRN/intermittently Type of Home: Independent living facility [facility]) Home Access: Elevator       Home Layout: One level Home Equipment: Rollator (4 wheels);Cane - single point;Shower seat;Grab bars - tub/shower;Hand held shower head;Other (comment);Adaptive equipment Additional Comments: 4L O2 at baseline    Prior Function Prior Level of Function : Independent/Modified Independent             Mobility Comments: walking with rollator outside of her home ADLs Comments: independent, reports is sedentary but completes adls and cooking without assistance. Pt has one meal provided per day, but prefers to cook her own food. Uses reacher for laundry. Housekeeping services but working on frequency. Son delivers groceries.     Extremity/Trunk Assessment   Upper Extremity Assessment Upper Extremity Assessment: Defer to OT evaluation    Lower Extremity Assessment Lower Extremity Assessment: Generalized weakness       Communication   Communication Communication: No apparent difficulties  Cognition Arousal: Alert Behavior During Therapy: WFL for tasks assessed/performed Overall Cognitive Status: Within Functional Limits for tasks assessed                                          General Comments General comments (skin integrity, edema, etc.): HR 81, SpO2 100-95% on 4L at rest and mobilizing respectively; BP 132/57 in bed, followed by 113/65 in recliner with HR at 82.    Exercises General Exercises - Lower Extremity Ankle Circles/Pumps: AROM, Both, 10 reps, Supine Quad Sets: Strengthening, Both, Seated Gluteal Sets: Strengthening, Both,  10 reps, Seated   Assessment/Plan    PT Assessment Patient needs continued PT services  PT Problem List Decreased strength;Decreased range of motion;Decreased activity tolerance;Decreased balance;Decreased mobility;Decreased knowledge of use of DME;Decreased knowledge of precautions;Cardiopulmonary status limiting activity;Obesity;Pain       PT Treatment Interventions DME instruction;Gait training;Balance training;Therapeutic activities;Therapeutic exercise;Functional mobility training;Neuromuscular re-education;Patient/family education;Modalities    PT Goals (Current goals can be found in the Care Plan section)  Acute Rehab PT Goals Patient Stated Goal: Reduce pain PT Goal Formulation: With patient Time For Goal Achievement: 05/12/23 Potential to Achieve Goals: Good    Frequency Min 1X/week     Co-evaluation PT/OT/SLP Co-Evaluation/Treatment: Yes Reason for Co-Treatment: Complexity of the patient's impairments (multi-system involvement);To address functional/ADL transfers;For patient/therapist safety PT goals addressed during session: Mobility/safety with mobility;Balance;Proper use of DME         AM-PAC PT "6 Clicks" Mobility  Outcome Measure Help needed turning from your back to your side while in a flat bed without using bedrails?: A Lot Help needed moving from lying on your back to sitting on the side of a flat bed without using bedrails?: A Lot Help needed moving to and from a bed to a chair (including a wheelchair)?: A Lot Help needed standing up from a chair using your arms (e.g., wheelchair or bedside chair)?: A Lot Help needed to walk in hospital room?: A Lot Help needed climbing 3-5 steps with a railing? : Total 6 Click Score: 11  End of Session Equipment Utilized During Treatment: Oxygen Activity Tolerance: Patient tolerated treatment well Patient left: in chair;with call bell/phone within reach;with chair alarm set;with family/visitor present;with SCD's  reapplied Nurse Communication: Mobility status PT Visit Diagnosis: Unsteadiness on feet (R26.81);Muscle weakness (generalized) (M62.81);Difficulty in walking, not elsewhere classified (R26.2);Pain Pain - part of body:  (abdomen)    Time: 8657-8469 PT Time Calculation (min) (ACUTE ONLY): 34 min   Charges:   PT Evaluation $PT Eval Moderate Complexity: 1 Mod   PT General Charges $$ ACUTE PT VISIT: 1 Visit         Kathlyn Sacramento, PT, DPT [facility]  Rehabilitation Services Physical Therapist Office: [phone] Website: [email or web address]   Berton Mount 04/28/2023, 12:07 PM

## 2023-04-28 NOTE — Progress Notes (Addendum)
Patient Name: Kathleen [name] Date of Encounter: 04/28/2023 [facility] Cardiologist: Marca Ancona, MD   Interval Summary  .    She is postop day 1.  No obvious complications so far.  Has a lot of abdominal pain which is to be expected.  Did report some nonspecific chest pain that was sharp sometimes dull at times.  She is a little bit disoriented off the Dilaudid though and not able to articulate.  Mildly short of breath.  Vital Signs .    Vitals:   04/28/23 0900 04/28/23 1115 04/28/23 1121 04/28/23 1204  BP: 135/76 105/71    Pulse: 74 80    Resp: 18 17  19   Temp:   98.3 F (36.8 C)   TempSrc:   Axillary   SpO2: 100% 97%  97%  Weight:      Height:        Intake/Output Summary (Last 24 hours) at 04/28/2023 1215 Last data filed at 04/28/2023 0900 Gross per 24 hour  Intake 1785.53 ml  Output 1703 ml  Net 82.53 ml      04/27/2023   10:28 AM 04/26/2023    5:00 AM 04/25/2023    4:23 AM  Last 3 Weights  Weight (lbs) 175 lb 185 lb 3 oz 184 lb 1.4 oz  Weight (kg) 79.379 kg 84 kg 83.5 kg      Telemetry/ECG    Sinus rhythm, 70s.  Intermittently A/V paced - Personally Reviewed  CV Studies    Echocardiogram 09/15/2022 1. Left ventricular ejection fraction, by estimation, is 35 to 40%. The  left ventricle has moderately decreased function. The left ventricle  demonstrates regional wall motion abnormalities. Abnormal (paradoxical)  septal motion, consistent with RV  pacemaker     The left ventricular internal cavity size was moderately dilated.  There is mild left ventricular hypertrophy. Left ventricular diastolic  parameters are consistent with Grade I diastolic dysfunction (impaired  relaxation).   2. Right ventricular systolic function is normal. The right ventricular  size is normal.   3. The mitral valve is normal in structure. No evidence of mitral valve  regurgitation. No evidence of mitral stenosis.   4. The aortic valve was not well visualized.  Aortic valve regurgitation  is not visualized. No aortic stenosis is present.   5. The inferior vena cava is normal in size with greater than 50%  respiratory variability, suggesting right atrial pressure of 3 mmHg.  Physical Exam .   GEN: No acute distress.   Neck: difficult to JVD Cardiac: RRR, no murmurs, rubs, or gallops.  Respiratory: CTA anterior.  Not able to auscultate posteriorly due to pain. GI: Soft, nontender, distended  MS: mild edema, cool to the touch.   Patient Profile    Kathleen [name] is a 77 y.o. female has hx of   chronic HFrEF, nonischemic cardiomyopathy, atrial fibrillation status post ablation 2015, [employer]tr[employer] PPM 2017, COPD chronically on supplemental oxygen 4 L, OSA on CPAP, BOOP tracheobronchomalacia, recurrent diverticulitis, colovesical fistula  and admitted on 04/22/2023.  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  Assessment & Plan .     Postop day 1 status post ex lap with sigmoid colectomy and left colostomy Recurrent sigmoid diverticulitis with colovaginal fistula and abscess  Overall seems to be doing okay and stable.  Has low blood pressure to begin with but seems baseline for her.  Volume status looks okay.  She  is maintaining normal sinus rhythm.  Complains of some nonspecific chest pain however was not able to elaborate.  May be referred pain from procedure.  Will continue to monitor this.  She is a little bit hypotensive and cool to the touch.  Need to monitor for low output state and close monitoring of the blood pressure, not needing pressor support currently.   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  April echo shows EF 35 to 40% normal RV function.    Volume status looks okay.  Continue holding spironolactone.  Did not tolerate Entresto due to low BP.  Titrate GDMT once more stable.    Atrial fibrillation status post AV nodal ablation Medtronic PPM Intermittent A/V paced.  QTc 410 on 11/14.  Monitor QTc.  Maintain electrolyte balance potassium greater than 4, magnesium greater than 2.  Will need close monitoring for postoperative A-fib. Continue Tikosyn, Lovenox for now, transition to Xarelto when able.   COPD chronically on 4 L OSA on CPAP BOOP with tracheobronchomalacia Anemia For questions or updates, please contact [facility] Please consult [email or web address] for contact info under        Signed, Abagail Kitchens, PA-C    Patient seen and examined   Now s/p abdominal surgery  On exam,  Neck full Lungs CTA anteirorly Cardiac  RRR  No murmurs Abd distended  Ext without edema  Warm  HFrEF   Volume status is OK   Reinitiate meds when BP allows PAF   Reemains in SR   on Tikosyn, lovenox   Resume Xarelto when able Follow K and Mg      Will continue to follow in post op setting   Dietrich Pates MD

## 2023-04-28 NOTE — Progress Notes (Signed)
Nutrition Follow-up  DOCUMENTATION CODES:   Non-severe (moderate) malnutrition in context of chronic illness  INTERVENTION:   If unable to advance diet tomorrow, recommend initiation of TPN.  RD also recommend TPN, if diet advanced but not able to progress or pt not tolerating, not eating sufficiently.   Once diet advanced, recommend addition of Ensure Enlive po TID between meals, each supplement provides 350 kcal and 20 grams of protein.  Once utilizing GI tract, recommend MVI with Minerals   NUTRITION DIAGNOSIS:   Moderate Malnutrition related to chronic illness as evidenced by mild fat depletion, mild muscle depletion, energy intake < 75% for > 7 days.  Continues  GOAL:   Patient will meet greater than or equal to 90% of their needs  Not Met  MONITOR:   PO intake, Supplement acceptance, Diet advancement, Labs, Weight trends  REASON FOR ASSESSMENT:   Consult Assessment of nutrition requirement/status  ASSESSMENT:   77 y.o. F, admitted with PMH of hypothyroidism, COPD,OSA (on BiPAP), BOOP (noncandidate for stenting), diverticulitis, CHF, obesity, HTN, gout, and colovaginal fistula. Presented 11/14 for worsening diverticulitis with new bowel perforation.  11/19 Complicated diverticulitis with colovaginal fistula requiring Ex Lap with sigmoid colectomy and end left colostomy  NPO this AM; RN reports Surgery Team is hopeful that diet can be advanced tomorrow.  NG with 200 mL out yesterday, 150 mL today  Very limited documentation of po intake but appears but was NPO on admission, Soft diet ordered on 11/15 and then NPO again on 11/18. Only one meal recorded on soft diet, pt ate 75%. Looks like pt ate 70-100% of CL diet. Some intake but essentially inadequate nutrition   Current wt 79.4 kg; noted weight of 84 kg yesterday  JP drains x 2 with 273 mL in 24 hours; wound vac to abdomen with 0 mL output. UOP 905 mL in 24 hours, only 75 mL documented today.   Labs:  reviewed Meds: reviewed   Diet Order:   Diet Order             Diet NPO time specified Except for: Ice Chips, Sips with Meds  Diet effective midnight                   EDUCATION NEEDS:   Education needs have been addressed  Skin:  Skin Assessment: Skin Integrity Issues: Skin Integrity Issues:: Wound VAC Wound Vac: abdomen Other: new colostomy (11/18)  Last BM:  11/18 (prior to colostomy), no stool yet via colostomy  Height:   Ht Readings from Last 1 Encounters:  04/27/23 5' (1.524 m)    Weight:   Wt Readings from Last 1 Encounters:  04/27/23 79.4 kg    Ideal Body Weight:  45.45 kg  BMI:  Body mass index is 34.18 kg/m.  Estimated Nutritional Needs:   Kcal:  1600-1800 kcal  Protein:  75-95 gm  Fluid:  >1.6L    Romelle Starcher MS, RDN, LDN, CNSC Registered Dietitian 3 Clinical Nutrition RD Pager and On-Call Pager Number Located in [place]

## 2023-04-28 NOTE — Progress Notes (Signed)
Daily Progress Note   Patient Name: Kathleen [name]       Date: 04/28/2023 DOB: 06-12-1945  Age: 77 y.o. MRN#: [id number] Attending Physician: [place], Eric J, DO Primary Care Physician: [name], MD Admit Date: 04/22/2023  Reason for Consultation/Follow-up: Establishing goals of care  Patient Profile/HPI:   77 y.o. female  with past medical history of colovaginal fistula associated with diverticulitis diagnosed 9/16 on multiple rounds of antibiotics, obesity, OSA on BiPAP, BOOP, tracheobronchomalacia, chronic respiratory failure on 4 to 5 L oxygen at baseline, persistent A-fib S/P AVN ablation and pacemaker implant 2017 failed BIV upgrade, maintained on Xarelto, chronic systolic heart failure, recent admission from 04/09/2023 to 04/13/2023 for weakness  admitted on 04/22/2023 with persistent diverticulitis with abnormal CT scan (outpatient).  CT of abdomen and pelvis revealed worsening diverticulitis with thickening of bowels, localized perforation, and perirectal abscess. PMT consulted for goals of care.  S/P exploratory laparotomy with sigmoid colectomy and end left colostomy on 11/19   Subjective: Chart reviewed including labs, progress notes, imaging from this and previous encounters.  Initial consult completed on 11/15- at that time GOC determined to be full scope with limit set at DNR, no intubation.  Evaluated patient, she was awake and alert. Daughter at bedside. Having some itching. Does not like hospital gown. Repositioned with pillows. Seen by cardiology for chest pain- recommendations to continue to monitor.    Physical Exam Vitals and nursing note reviewed.  Constitutional:      General: She is not in acute distress. Neurological:     Mental Status: She is alert.              Vital Signs: BP 129/84   Pulse 94   Temp 98.3 F (36.8 C) (Axillary)   Resp 18   Ht 5' (1.524 m)   Wt 79.4 kg   SpO2 97%   BMI 34.18 kg/m  SpO2: SpO2: 97 % O2 Device: O2 Device: Nasal Cannula O2 Flow Rate: O2 Flow Rate (L/min): 4 L/min  Intake/output summary:  Intake/Output Summary (Last 24 hours) at 04/28/2023 1450 Last data filed at 04/28/2023 1100 Gross per 24 hour  Intake 643.19 ml  Output 1218 ml  Net -574.81 ml   LBM: Last BM Date : 04/26/23 Baseline Weight: Weight: 79.4 kg Most recent weight: Weight: 79.4 kg  Palliative Assessment/Data: PPS: 20%      Patient Active Problem List   Diagnosis Date Noted   Preop cardiovascular exam 04/23/2023   Diverticulitis of colon with perforation 04/22/2023   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   Pill dysphagia 04/06/2023   COPD exacerbation (HCC) 03/26/2023   Absolute anemia 03/17/2023   LLQ pain 03/17/2023   Chronic nausea 03/17/2023   Hypokalemia 02/23/2023   Diverticulitis 02/22/2023   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   Carpal tunnel syndrome, right upper limb 01/27/2023   Acute exacerbation of chronic obstructive pulmonary disease (COPD) (HCC) 01/13/2023   Dysuria 11/26/2022   Hypercoagulable state due to persistent atrial fibrillation (HCC) 11/18/2022   Acute on chronic heart failure with preserved ejection fraction (HCC) 10/26/2022   COPD with acute exacerbation (HCC) 10/09/2022   Pain in left shoulder 07/23/2022   Chronic obstructive pulmonary disease (HCC) 06/28/2022   Palliative care encounter 06/28/2022   Post-viral cough syndrome 06/02/2022   Paranasal sinus disease 05/19/2022   Heart failure with reduced ejection fraction (HCC) 05/14/2022   Pneumonia 04/29/2022   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   Depression, major, single episode, moderate (HCC) 04/27/2022   Obesity (BMI 30-39.9) 03/18/2022   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   Low TSH level  12/02/2021   Acute idiopathic gout of left hand 11/19/2021   Acquired hypothyroidism 11/12/2021   Encounter for palliative care involving management of pain 11/12/2021   Post concussive syndrome 11/11/2021   Chronic left-sided low back pain 11/04/2021   Gout 10/01/2021   Hyperlipidemia with target LDL less than 100 08/07/2021   Chronic anticoagulation 07/07/2021   History of colon polyps 07/07/2021   Stage 3a chronic kidney disease (HCC) 05/23/2021   Dietary iron deficiency 05/23/2021   Age-related osteoporosis without current pathological fracture 04/22/2021   Type 2 diabetes mellitus with diabetic neuropathy, without long-term current use of insulin (HCC) 04/22/2021   Sleep apnea 01/22/2021   Statin intolerance 10/12/2019   Statin myopathy 02/24/2019   Chronic combined systolic and diastolic heart failure (HCC) 04/06/2018   HTN (hypertension) 04/06/2018   COPD (chronic obstructive pulmonary disease) (HCC) 04/06/2018   Persistent atrial fibrillation (HCC) 04/06/2018   Chronic respiratory failure with hypoxia (HCC) 03/28/2018   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  Thank you for allowing the Palliative Medicine Team to assist in the care of this patient.  Total time: 35 minutes Prolonged billing:  Time includes:   Preparing to see the patient (e.g., review of tests) Obtaining and/or reviewing separately obtained history Performing a medically necessary appropriate examination and/or  evaluation Counseling and educating the patient/family/caregiver Ordering medications, tests, or procedures Referring and communicating with other health care professionals (when not reported separately) Documenting clinical information in the electronic or other health record Independently interpreting results (not reported separately) and communicating results to the patient/family/caregiver Care coordination (not reported separately) Clinical documentation  Ocie Bob, AGNP-C Palliative Medicine   Please contact Palliative Medicine Team phone at [phone] for questions and concerns.

## 2023-04-28 NOTE — Progress Notes (Signed)
PROGRESS NOTE    Kathleen [name]  BMW:[id number] DOB: [birth year] DOA: 04/22/2023 PCP: [name], MD    Brief Narrative:   Kathleen [name] is a 77 y.o. 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 Outpatient CT of abdomen and pelvis done as per Dr. Meridee Score which showed worsening diverticulitis with thickening of bowels, 1.2 cm localized perforation, 5.6 cm perirectal abscess; in which he recommended to proceed to the ED for further evaluation and management. On presentation, Tmax was 101.8, sodium was 129, WBC count 11.9. She was started on IV antibiotics.  General surgery, GI, cardiology, palliative care were consulted.  TRH consulted for admission for further evaluation and management of recurrent diverticulitis associated with perforation and abscess.  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  Has been treated with multiple rounds of IV followed by oral antibiotics recently with no significant improvement.  Patient with elevated temperature 101.8 F with WBC count 11.9.  Was started on empiric IV antibiotics.  General surgery was consulted and patient underwent exploratory laparotomy with sigmoid colectomy and end left colostomy by Dr. Dwain Sarna  on 04/27/2023. -- General Surgery following, appreciate assistance -- Continue NPO, NGT, Foley per general surgery -- Zosyn, plan 5-day postoperative course per [facility]  Hyponatremia -- Na 129>>134 -- Furosemide currently on hold given n.p.o. status -- BMP daily  Hypokalemia Pleated, potassium 4.0 today. -- Continue monitor electrolytes daily  Persistent atrial fibrillation on chronic anticoagulation -- Cardiology following, appreciate assistance -- Continue Tikosyn 250 mg p.o. twice daily -- Holding home Xarelto for now, will defer to general surgery when okay to restart  Chronic hypoxic respiratory failure, oxygen dependent Hx COPD/BOOP At baseline on 4-5 L oxygen via nasal cannula. -- Mikael Spray neb daily -- Breo Ellipta 1 puff daily -- Albuterol neb every 2 hours.  Wheezing/shortness of breath  Chronic systolic congestive heart failure, compensated Patient did not tolerate Entresto and beta-blocker due to low blood pressures.  TTE April 2024 with LVEF 35-40%.  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 -- Cardiology following as above -- Holding home spironolactone, torsemide for now -- Strict I's and O's, daily weights  Type 2 diabetes mellitus Diabetic neuropathy --Gabapentin  GERD -- Protonix 40 mg IV every 24 hours  Morbid obesity, class I Body mass index is 34.18 kg/m.  Complicates all facets of care.  Moderate protein calorie malnutrition Nutrition Status: Nutrition Problem: Moderate Malnutrition Etiology: chronic illness Signs/Symptoms: mild fat depletion, mild muscle depletion, energy intake < 75% for > 7 days Interventions: Ensure Enlive (each supplement provides 350kcal and 20 grams of protein), Magic cup, MVI -- Remains n.p.o. following surgery, plan to reading start supplementations when able as directed by general surgery  Physical deconditioning -- PT/OT evaluation: Pending  Goals of care Seen by palliative care, CODE STATUS now changed to DNR.   Overall prognosis remains guarded.    DVT prophylaxis: enoxaparin (LOVENOX) injection 40 mg Start:  04/28/23 0915 SCDs Start: 04/22/23 2228    Code Status: Limited: Do not attempt resuscitation (DNR) -DNR-LIMITED -Do Not Intubate/DNI  Family Communication: Updated daughter present at bedside this morning  Disposition Plan:  Level of care: Progressive Status is: Inpatient Remains inpatient appropriate because: N.p.o., IV antibiotics, needs further mobilization; return of bowel function and sign off from specialist before ready for discharge; transferring out of ICU today    Consultants:  General surgery [facility] Palliative care Cardiology  Procedures:  Exploratory laparotomy with sigmoid colectomy and end left colostomy, Dr. Dwain Sarna; 04/27/2023  Antimicrobials:  Zosyn 11/14>> Perioperative cefotetan 11/19   Subjective: Patient seen examined bedside, resting comfortably.  Lying in bed.  Overall feels much improved today.  Underwent surgery yesterday with exploratory laparotomy with sigmoid colectomy and end ileostomy.  Remains n.p.o. with NG tube/Foley catheter in place; seen by general surgery this morning with recommendation of continuing current care but okay for transfer out of the ICU today.  Blood pressures stable, remains on her home regimen of supplemental oxygen.  Updated daughter present at bedside.  Seen by ostomy RN this morning.  No output in regards to stool or air in ostomy currently.  No other specific questions, concerns or complaints at this time.  Denies headache, no dizziness, no chest pain, no palpitations, no shortness of breath, no fever/chills/night sweats, no nausea/vomitus or diarrhea, no focal weakness, no fatigue, no paresthesias.  No acute events overnight per nursing staff.  Objective: Vitals:   04/28/23 0758 04/28/23 0800 04/28/23 0825 04/28/23 0900  BP:  (!) 130/52  135/76  Pulse:  71  74  Resp: 17 19  18   Temp:      TempSrc:       SpO2: 99% 99% 94% 100%  Weight:      Height:        Intake/Output Summary (Last 24 hours) at 04/28/2023 1050 Last data filed at 04/28/2023 0900 Gross per 24 hour  Intake 1885.53 ml  Output 1703 ml  Net 182.53 ml   Filed Weights   04/25/23 0423 04/26/23 0500 04/27/23 1028  Weight: 83.5 kg 84 kg 79.4 kg    Examination:  Physical Exam: GEN: NAD, alert and oriented x 3, obese, ill in appearance HEENT: NCAT, PERRL, EOMI, sclera clear, dry mucous membranes PULM: Breath sounds slightly diminished bilateral bases, no wheezing/crackles, normal respiratory effort without accessory muscle use, on 4 L nasal cannula which is her baseline CV: RRR w/o M/G/R GI: abd soft, mild TTP surrounding surgical incision site, ostomy noted with no air or stool in collection bag, wound VAC noted to surgical incision site MSK: + lower extremity peripheral edema, muscle strength globally intact 5/5 bilateral upper/lower extremities NEURO: CN II-XII intact, no focal deficits, sensation to light touch intact PSYCH: normal mood/affect Integumentary: Abdominal surgical incision site/ostomy noted as above, otherwise no other concerning rashes/lesions/wounds noted on exposed skin surfaces     Data Reviewed: I have personally reviewed following labs and imaging studies  CBC: Recent Labs  Lab 04/23/23 0710 04/24/23 0441 04/25/23 0618 04/26/23 0859 04/27/23 1332 04/28/23 0442  WBC 9.9 9.2 7.4 8.1  --  18.7*  NEUTROABS 8.1* 7.3 5.9 5.9  --  16.9*  HGB 10.0* 10.3* 9.4* 9.6* 13.3 9.3*  HCT 31.8* 32.1* 28.8* 31.2* 39.0 29.4*  MCV 82.4 81.7 82.5 82.5  --  81.9  PLT 202 240 235 269  --  333   Basic Metabolic Panel: Recent Labs  Lab 04/25/23 0618 04/26/23 0859 04/26/23 1751  04/27/23 0750 04/27/23 1332 04/28/23 0442  NA 130* 129* 131* 132* 133* 134*  K 4.0 3.2* 3.8 3.9 3.9 4.0  CL 95* 94* 91* 97*  --  100  CO2 24 24 28 27   --  25  GLUCOSE 150* 158* 138* 128*  --  206*  BUN 7* 8 7* 6*  --  6*   CREATININE 0.86 1.19* 0.86 0.74  --  0.83  CALCIUM 9.1 9.2 9.2 8.4*  --  8.2*  MG 1.8 1.8 2.7* 2.2  --  2.2   GFR: Estimated Creatinine Clearance: 53.8 mL/min (by C-G formula based on SCr of 0.83 mg/dL). Liver Function Tests: Recent Labs  Lab 04/22/23 1840 04/23/23 0710 04/24/23 0441  AST 28 19 19   ALT 21 19 20   ALKPHOS 57 44 52  BILITOT 1.2* 1.1 0.8  PROT 6.4* 5.9* 6.2*  ALBUMIN 2.9* 2.6* 2.6*   Recent Labs  Lab 04/22/23 1840  LIPASE 30   No results for input(s): "AMMONIA" in the last 168 hours. Coagulation Profile: Recent Labs  Lab 04/22/23 1840  INR 1.7*   Cardiac Enzymes: No results for input(s): "CKTOTAL", "CKMB", "CKMBINDEX", "TROPONINI" in the last 168 hours. BNP (last 3 results) No results for input(s): "PROBNP" in the last 8760 hours. HbA1C: No results for input(s): "HGBA1C" in the last 72 hours. CBG: Recent Labs  Lab 04/22/23 2144 04/27/23 1413 04/27/23 1611 04/27/23 2147 04/28/23 0751  GLUCAP 136* 160* 204* 209* 181*   Lipid Profile: No results for input(s): "CHOL", "HDL", "LDLCALC", "TRIG", "CHOLHDL", "LDLDIRECT" in the last 72 hours. Thyroid Function Tests: No results for input(s): "TSH", "T4TOTAL", "FREET4", "T3FREE", "THYROIDAB" in the last 72 hours. Anemia Panel: No results for input(s): "VITAMINB12", "FOLATE", "FERRITIN", "TIBC", "IRON", "RETICCTPCT" in the last 72 hours. Sepsis Labs: Recent Labs  Lab 04/22/23 1905 04/22/23 2211 04/23/23 0114  LATICACIDVEN 1.8 1.1 0.8    Recent Results (from the past 240 hour(s))  Resp panel by RT-PCR (RSV, Flu A&B, Covid) Anterior Nasal Swab     Status: None   Collection Time: 04/22/23  6:38 PM   Specimen: Anterior Nasal Swab  Result Value Ref Range Status   SARS Coronavirus 2 by RT PCR NEGATIVE NEGATIVE Final   Influenza A by PCR NEGATIVE NEGATIVE Final   Influenza B by PCR NEGATIVE NEGATIVE Final    Comment: (NOTE) The Xpert Xpress SARS-CoV-2/FLU/RSV plus assay is intended as an aid in the  diagnosis of influenza from Nasopharyngeal swab specimens and should not be used as a sole basis for treatment. Nasal washings and aspirates are unacceptable for Xpert Xpress SARS-CoV-2/FLU/RSV testing.  Fact Sheet for Patients: BloggerCourse.com  Fact Sheet for Healthcare Providers: SeriousBroker.it  This test is not yet approved or cleared by the [place] FDA and has been authorized for detection and/or diagnosis of SARS-CoV-2 by FDA under an Emergency Use Authorization (EUA). This EUA will remain in effect (meaning this test can be used) for the duration of the COVID-19 declaration under Section 564(b)(1) of the Act, 21 U.S.C. section 360bbb-3(b)(1), unless the authorization is terminated or revoked.     Resp Syncytial Virus by PCR NEGATIVE NEGATIVE Final    Comment: (NOTE) Fact Sheet for Patients: BloggerCourse.com  Fact Sheet for Healthcare Providers: SeriousBroker.it  This test is not yet approved or cleared by the [place] FDA and has been authorized for detection and/or diagnosis of SARS-CoV-2 by FDA under an Emergency Use Authorization (EUA). This EUA will remain in effect (meaning this test can be used)  for the duration of the COVID-19 declaration under Section 564(b)(1) of the Act, 21 U.S.C. section 360bbb-3(b)(1), unless the authorization is terminated or revoked.  Performed at [facility], [address] [place], [place] [address]   Blood Culture (routine x 2)     Status: None   Collection Time: 04/22/23  6:40 PM   Specimen: BLOOD RIGHT ARM  Result Value Ref Range Status   Specimen Description BLOOD RIGHT ARM  Final   Special Requests   Final    BOTTLES DRAWN AEROBIC AND ANAEROBIC Blood Culture adequate volume   Culture   Final    NO GROWTH 5 DAYS Performed at [facility], [address], [place], [place] [address]    Report Status  04/27/2023 FINAL  Final  Blood Culture (routine x 2)     Status: None   Collection Time: 04/22/23  7:08 PM   Specimen: BLOOD LEFT ARM  Result Value Ref Range Status   Specimen Description BLOOD LEFT ARM  Final   Special Requests   Final    BOTTLES DRAWN AEROBIC AND ANAEROBIC Blood Culture adequate volume   Culture   Final    NO GROWTH 5 DAYS Performed at [facility], [address], [place], [place] [address]    Report Status 04/27/2023 FINAL  Final  Surgical PCR screen     Status: None   Collection Time: 04/27/23 12:21 AM   Specimen: Nasal Mucosa; Nasal Swab  Result Value Ref Range Status   MRSA, PCR NEGATIVE NEGATIVE Final   Staphylococcus aureus NEGATIVE NEGATIVE Final    Comment: (NOTE) The Xpert SA Assay (FDA approved for NASAL specimens in patients 49 years of age and older), is one component of a comprehensive surveillance program. It is not intended to diagnose infection nor to guide or monitor treatment. Performed at [facility], [address], [place], [place] [address]          Radiology Studies: No results found.      Scheduled Meds:  Chlorhexidine Gluconate Cloth  6 each Topical Daily   dofetilide  250 mcg Oral Q12H   enoxaparin (LOVENOX) injection  40 mg Subcutaneous Q24H   fluticasone furoate-vilanterol  1 puff Inhalation Daily   HYDROmorphone   Intravenous Q4H   methocarbamol (ROBAXIN) injection  500 mg Intravenous Q8H   mouth rinse  15 mL Mouth Rinse 4 times per day   pantoprazole (PROTONIX) IV  40 mg Intravenous Q24H   revefenacin  175 mcg Nebulization Daily   Continuous Infusions:  sodium chloride     piperacillin-tazobactam (ZOSYN)  IV 12.5 mL/hr at 04/28/23 0900   promethazine (PHENERGAN) injection (IM or IVPB)       LOS: 6 days    Time spent: 52 minutes spent on chart review, discussion with nursing staff, consultants, updating family and interview/physical exam; more than 50% of that time was spent in counseling and/or  coordination of care.    Alvira Philips [place], DO [facility] Available via Epic secure chat 7am-7pm After these hours, please refer to coverage provider listed on [email or web address] 04/28/2023, 10:50 AM

## 2023-04-28 NOTE — Telephone Encounter (Signed)
HF Paramedicine Team Based Care Meeting  One Month Post Enrollment Assessment  HF MD- NA  HF NP - Amy Clegg NP-C   [facility] HF Paramedicine  Kathleen [name]  [facility] admit within the last 30 days for heart failure? No but has had 3 admissions recently for other medical reasons.  Medications concerns? Some issues following bottle instructions rather than visit instructions.  Barriers to discharge? Medically fragile- would benefit from continuing to be followed at this time- haven't gotten to complete many visits given high rate of admissions.     Burna Sis, LCSW Clinical Social Worker [facility] Desk#: [phone] Cell#: [phone]

## 2023-04-29 DIAGNOSIS — K5720 Diverticulitis of large intestine with perforation and abscess without bleeding: Secondary | ICD-10-CM | POA: Diagnosis not present

## 2023-04-29 LAB — BASIC METABOLIC PANEL
Anion gap: 9 (ref 5–15)
Anion gap: 12 (ref 5–15)
BUN: 17 mg/dL (ref 8–23)
BUN: 21 mg/dL (ref 8–23)
CO2: 26 mmol/L (ref 22–32)
CO2: 26 mmol/L (ref 22–32)
Calcium: 7.9 mg/dL — ABNORMAL LOW (ref 8.9–10.3)
Calcium: 7.7 mg/dL — ABNORMAL LOW (ref 8.9–10.3)
Chloride: 98 mmol/L (ref 98–111)
Chloride: 97 mmol/L — ABNORMAL LOW (ref 98–111)
Creatinine, Ser: 0.97 mg/dL (ref 0.44–1.00)
Creatinine, Ser: 0.88 mg/dL (ref 0.44–1.00)
GFR, Estimated: >60 mL/min (ref >60)
GFR, Estimated: >60 mL/min (ref >60)
Glucose, Bld: 157 mg/dL — ABNORMAL HIGH (ref 70–99)
Glucose, Bld: 135 mg/dL — ABNORMAL HIGH (ref 70–99)
Potassium: 3.5 mmol/L (ref 3.5–5.1)
Potassium: 3.7 mmol/L (ref 3.5–5.1)
Sodium: 133 mmol/L — ABNORMAL LOW (ref 135–145)
Sodium: 135 mmol/L (ref 135–145)

## 2023-04-29 LAB — CBC
HCT: 28.6 % — ABNORMAL LOW (ref 36.0–46.0)
Hemoglobin: 9.1 g/dL — ABNORMAL LOW (ref 12.0–15.0)
MCH: 26.4 pg (ref 26.0–34.0)
MCHC: 31.8 g/dL (ref 30.0–36.0)
MCV: 82.9 fL (ref 80.0–100.0)
Platelets: 392 10*3/uL (ref 150–400)
RBC: 3.45 MIL/uL — ABNORMAL LOW (ref 3.87–5.11)
RDW: 15.6 % — ABNORMAL HIGH (ref 11.5–15.5)
WBC: 19.3 10*3/uL — ABNORMAL HIGH (ref 4.0–10.5)
nRBC: 0.2 % (ref 0.0–0.2)

## 2023-04-29 LAB — GLUCOSE, CAPILLARY
Glucose-Capillary: 160 mg/dL — ABNORMAL HIGH (ref 70–99)
Glucose-Capillary: 144 mg/dL — ABNORMAL HIGH (ref 70–99)
Glucose-Capillary: 179 mg/dL — ABNORMAL HIGH (ref 70–99)
Glucose-Capillary: 141 mg/dL — ABNORMAL HIGH (ref 70–99)
Glucose-Capillary: 171 mg/dL — ABNORMAL HIGH (ref 70–99)

## 2023-04-29 LAB — MAGNESIUM: Magnesium: 2.4 mg/dL (ref 1.7–2.4)

## 2023-04-29 LAB — SURGICAL PATHOLOGY

## 2023-04-29 MED ORDER — HYDROMORPHONE HCL 1 MG/ML IJ SOLN
0.5000 mg | INTRAMUSCULAR | Status: DC | PRN
  Administered 2023-04-29 – 2023-05-07 (×62): 0.5 mg via INTRAVENOUS
  Filled 2023-04-29 (×64): qty 0.5

## 2023-04-29 MED ORDER — ACETAMINOPHEN 10 MG/ML IV SOLN
1000.0000 mg | Freq: Once | INTRAVENOUS | Status: AC
  Administered 2023-04-29: 1000 mg via INTRAVENOUS
  Filled 2023-04-29: qty 100

## 2023-04-29 MED ORDER — POTASSIUM CHLORIDE CRYS ER 20 MEQ PO TBCR: 20.0000 meq | EXTENDED_RELEASE_TABLET | Freq: Once | ORAL | Status: DC

## 2023-04-29 MED ORDER — POTASSIUM CHLORIDE 20 MEQ PO PACK
40.0000 meq | PACK | Freq: Once | ORAL | Status: AC
  Administered 2023-04-29: 40 meq via ORAL
  Filled 2023-04-29: qty 2

## 2023-04-29 MED ORDER — POTASSIUM CHLORIDE 20 MEQ PO PACK
20.0000 meq | PACK | Freq: Once | ORAL | Status: AC
  Administered 2023-04-29: 20 meq via ORAL
  Filled 2023-04-29: qty 1

## 2023-04-29 MED ORDER — BOOST / RESOURCE BREEZE PO LIQD CUSTOM: 1.0000 | Freq: Three times a day (TID) | ORAL | Status: DC

## 2023-04-29 MED ORDER — POTASSIUM CHLORIDE 10 MEQ/100ML IV SOLN
10.0000 meq | INTRAVENOUS | Status: DC
Start: 2023-04-29 — End: 2023-04-29
  Administered 2023-04-29 (×2): 10 meq via INTRAVENOUS
  Filled 2023-04-29 (×4): qty 100

## 2023-04-29 NOTE — Progress Notes (Signed)
Wasted 15 ml Dilaudid from PCA cartridge . PCA d/c'd. Witnessed by Meredith Staggers, RN.

## 2023-04-29 NOTE — Progress Notes (Addendum)
Patient Name: Kathleen [name] Date of Encounter: 04/29/2023 [facility] Cardiologist: Marca Ancona, MD   Interval Summary  .    She is postop day 2.  Appears much more lively today.  Chest pain continues to decrease.  Mildly short of breath.  Blood pressures appear to be stable.  Reporting mild cough.  Vital Signs .    Vitals:   04/29/23 0758 04/29/23 0800 04/29/23 0900 04/29/23 1000  BP: 106/70 106/70 123/71 (!) 98/41  Pulse: 82 85 86 81  Resp: 19 (!) 23 14 (!) 26  Temp: 98 F (36.7 C)     TempSrc: Oral     SpO2: 96% 97% 94% 97%  Weight:      Height:        Intake/Output Summary (Last 24 hours) at 04/29/2023 1108 Last data filed at 04/29/2023 1005 Gross per 24 hour  Intake 338.59 ml  Output 1705 ml  Net -1366.41 ml      04/29/2023    5:00 AM 04/27/2023   10:28 AM 04/26/2023    5:00 AM  Last 3 Weights  Weight (lbs) 174 lb 13.2 oz 175 lb 185 lb 3 oz  Weight (kg) 79.3 kg 79.379 kg 84 kg      Telemetry/ECG    Sinus rhythm, 70s.  Intermittently A/V paced - Personally Reviewed  CV Studies    Echocardiogram 09/15/2022 1. Left ventricular ejection fraction, by estimation, is 35 to 40%. The  left ventricle has moderately decreased function. The left ventricle  demonstrates regional wall motion abnormalities. Abnormal (paradoxical)  septal motion, consistent with RV  pacemaker     The left ventricular internal cavity size was moderately dilated.  There is mild left ventricular hypertrophy. Left ventricular diastolic  parameters are consistent with Grade I diastolic dysfunction (impaired  relaxation).   2. Right ventricular systolic function is normal. The right ventricular  size is normal.   3. The mitral valve is normal in structure. No evidence of mitral valve  regurgitation. No evidence of mitral stenosis.   4. The aortic valve was not well visualized. Aortic valve regurgitation  is not visualized. No aortic stenosis is present.   5. The inferior  vena cava is normal in size with greater than 50%  respiratory variability, suggesting right atrial pressure of 3 mmHg.  Physical Exam .   GEN: No acute distress.   Neck: difficult to JVD Cardiac: RRR, no murmurs, rubs, or gallops.  Respiratory: CTA anterior.  Not able to auscultate posteriorly due to pain. GI: Soft, nontender, distended  MS: No edema, feels warm today versus yesterday.  Patient Profile    Kathleen [name] is a 77 y.o. female has hx of   chronic HFrEF, nonischemic cardiomyopathy, atrial fibrillation status post ablation 2015, Medtronic PPM 2017, COPD chronically on supplemental oxygen 4 L, OSA on CPAP, BOOP tracheobronchomalacia, recurrent diverticulitis, colovesical fistula  and admitted on 04/22/2023.  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  Assessment & Plan .     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  She has much more color to her and feels warmer.  Maintaining normal sinus rhythm.  Blood pressures are maintaining and lab work looks good.  Volume status looks good, would continue to hold diuretics for now.  Maybe in the coming days she  might need it.  Overall seems to be progressing appropriately.  Addendum: she started having a coughing spell. Her lungs sounds clear with some mild crackles in the RLL. Sounds like she has some upper airway secretions (has audible gurgling) and she's not clearing them. I dont think its related to congestion, asking RT to come and see. Have ordered a flutter valve per their request.    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  April echo shows EF 35 to 40% normal RV function.    Volume status looks okay.  Continue holding spironolactone.  Did  not tolerate Entresto due to low BP.  Did not tolerate SGLT2 inhibitor UTIs.  Titrate GDMT once more stable.  This will be difficult.   Atrial fibrillation status post AV nodal ablation Medtronic PPM Intermittent A/V paced.  QTc 410 on 11/14.  Monitor QTc.  Maintain electrolyte balance potassium greater than 4, magnesium greater than 2.  Will need close monitoring for postoperative A-fib. Continue Tikosyn, Lovenox for now, transition to Xarelto when able.   COPD chronically on 4 L OSA on CPAP BOOP with tracheobronchomalacia Anemia For questions or updates, please contact [facility] Please consult [email or web address] for contact info under        Signed, Abagail Kitchens, PA-C    Pt seen and examined     I agree with findings as noted by Thamas Jaegers above  Pt is comfortable in bed   Denies CP now   On exam Lungs are relatively clear anteriorly    Cardiac exam   RRR  No murmurs ABd   Sl distended  Ext are without edema   PAF  Maintaining SR on Tikosyn   REsume  anticoagulation when ok from surgical standpoint     WIth Tikosyn, watch K  Needs to be greater than 4  Supplemnt as needed   HFrEF Volume status overalll OK  Follow I/O    BP precludes use of CHF meds    Prior to admit she was on torsemide and spironolactone   Hold for now .  S/p PPM     Will be available as needed while she is here in the hospital   Call with questions   SIgning off for now   Dietrich Pates MD

## 2023-04-29 NOTE — NC FL2 (Signed)
[place] MEDICAID FL2 LEVEL OF CARE FORM     IDENTIFICATION  Patient Name: Kathleen [name] Birthdate: 03/22/1946 Sex: female Admission Date (Current Location): 04/22/2023  [facility] and [place] Number:  Producer, television/film/video and Address:  [facility] [place], [address], [place], [place] [address]      Provider Number: [id number]  Attending Physician Name and Address:  [place], Eric J, DO  Relative Name and Phone Number:  Avalina [name]; Son; [phone]    Current Level of Care: Hospital Recommended Level of Care: Skilled Nursing Facility Prior Approval Number:    Date Approved/Denied:   PASRR Number: [id number]  Discharge Plan: SNF    Current Diagnoses: Patient Active Problem List   Diagnosis Date Noted   Preop cardiovascular exam 04/23/2023   Diverticulitis of colon with perforation 04/22/2023   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   Pill dysphagia 04/06/2023   COPD exacerbation (HCC) 03/26/2023   Absolute anemia 03/17/2023   LLQ pain 03/17/2023   Chronic nausea 03/17/2023   Hypokalemia 02/23/2023   Diverticulitis 02/22/2023   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   Carpal tunnel syndrome, right upper limb 01/27/2023   Acute exacerbation of chronic obstructive pulmonary disease (COPD) (HCC) 01/13/2023   Dysuria 11/26/2022   Hypercoagulable state due to persistent atrial fibrillation (HCC) 11/18/2022   Acute on chronic heart failure with preserved ejection fraction (HCC) 10/26/2022   COPD with acute exacerbation (HCC) 10/09/2022   Pain in left shoulder 07/23/2022   Chronic obstructive pulmonary disease (HCC) 06/28/2022   Palliative care encounter 06/28/2022   Post-viral cough syndrome 06/02/2022   Paranasal sinus disease 05/19/2022   Heart failure with reduced ejection fraction (HCC) 05/14/2022   Pneumonia 04/29/2022   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   Depression, major, single episode, moderate (HCC)  04/27/2022   Obesity (BMI 30-39.9) 03/18/2022   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   Low TSH level 12/02/2021   Acute idiopathic gout of left hand 11/19/2021   Acquired hypothyroidism 11/12/2021   Encounter for palliative care involving management of pain 11/12/2021   Post concussive syndrome 11/11/2021   Chronic left-sided low back pain 11/04/2021   Gout 10/01/2021   Hyperlipidemia with target LDL less than 100 08/07/2021   Chronic anticoagulation 07/07/2021   History of colon polyps 07/07/2021   Stage 3a chronic kidney disease (HCC) 05/23/2021   Dietary iron deficiency 05/23/2021   Age-related osteoporosis without current pathological fracture 04/22/2021   Type 2 diabetes mellitus with diabetic neuropathy, without long-term current use of insulin (HCC) 04/22/2021   Sleep apnea 01/22/2021   Statin intolerance 10/12/2019   Statin myopathy 02/24/2019   Chronic combined systolic and diastolic heart failure (HCC) 04/06/2018   HTN (hypertension) 04/06/2018   COPD (chronic obstructive pulmonary disease) (HCC) 04/06/2018   Persistent atrial fibrillation (HCC) 04/06/2018   Chronic respiratory failure with hypoxia (HCC) 03/28/2018   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 12/02/2015   Protein-calorie malnutrition, moderate (HCC) 09/11/2015    Orientation RESPIRATION BLADDER Height & Weight     Self, Time, Situation, Place  O2, Other (Comment) (Nasal Cannula 4L O2 Fi02 32%; BiPap (non-invasive home ventilator, size medium face mask)) Continent (Urethral Catheter Latex;Straight-tip 14 Fr.; Peri-operative use for selective surgical procedure - not to exceed 24 hours post-op (order to continue foley)) Weight: 174 lb 13.2 oz (79.3 kg) Height:  5' (152.4 cm)  BEHAVIORAL SYMPTOMS/MOOD NEUROLOGICAL BOWEL NUTRITION STATUS      Continent, Colostomy Diet (Please see dc summary)  AMBULATORY  STATUS COMMUNICATION OF NEEDS Skin   Extensive Assist Verbally PU Stage and Appropriate  Care, Other (Comment) (Closed System Drain 1 and Drain 2 Right RLQ Bulb (JP) 19 Fr.; Negative Pressure Wound Therapy Abdomen Left;Lower;Medial)                       Personal Care Assistance Level of Assistance  Bathing, Feeding, Dressing Bathing Assistance: Maximum assistance Feeding assistance: Maximum assistance Dressing Assistance: Maximum assistance     Functional Limitations Info  Sight Sight Info: Impaired (Reading glasses)        SPECIAL CARE FACTORS FREQUENCY  PT (By licensed PT), OT (By licensed OT)     PT Frequency: 5x OT Frequency: 5x            Contractures Contractures Info: Not present    Additional Factors Info  Code Status, Allergies Code Status Info: DNR-LIMITED -Do Not Intubate/DNI Allergies Info: Allopurinol; Clindamycin; Flublok (influenza Vaccine Recombinant); Pneumococcal 13-val Conj Vacc; Dronedarone; Montelukast; Brovana (arformoterol); Budesonide; Entresto (sacubitril-valsartan); Fosamax (alendronate Sodium); Jardiance (empagliflozin); Meperidine; Microplegia Msa-msg (plegisol); Rosuvastatin; Tetracycline; Adhesvie (tape); Lovastatin           Current Medications (04/29/2023):  This is the current hospital active medication list Current Facility-Administered Medications  Medication Dose Route Frequency Provider Last Rate Last Admin   albuterol (PROVENTIL) (2.5 MG/3ML) 0.083% nebulizer solution 2.5 mg  2.5 mg Nebulization Q2H PRN Barnetta Chapel, PA-C       Chlorhexidine Gluconate Cloth 2 % PADS 6 each  6 each Topical Daily Glade Lloyd, MD   6 each at 04/29/23 1014   diphenhydrAMINE (BENADRYL) injection 12.5 mg  12.5 mg Intravenous Q6H PRN Emelia Loron, MD   12.5 mg at 04/28/23 1521   Or   diphenhydrAMINE (BENADRYL) 12.5 MG/5ML elixir 12.5 mg  12.5 mg Oral Q6H PRN Emelia Loron, MD       dofetilide [facility]) capsule 250 mcg  250 mcg Oral Q12H Barnetta Chapel, PA-C   250 mcg at 04/29/23 1017   enoxaparin (LOVENOX) injection 40 mg   40 mg Subcutaneous Q24H Emelia Loron, MD   40 mg at 04/29/23 1017   feeding supplement (BOOST / RESOURCE BREEZE) liquid 1 Container  1 Container Oral TID BM [place], Eric J, DO       fluticasone furoate-vilanterol (BREO ELLIPTA) 100-25 MCG/ACT 1 puff  1 puff Inhalation Daily Barnetta Chapel, PA-C   1 puff at 04/29/23 0735   guaiFENesin-dextromethorphan (ROBITUSSIN DM) 100-10 MG/5ML syrup 10 mL  10 mL Oral Q4H PRN Barnetta Chapel, PA-C   10 mL at 04/26/23 1926   HYDROmorphone (DILAUDID) 1 mg/mL PCA injection   Intravenous Q4H Emelia Loron, MD   0.4 mg at 04/29/23 1200   methocarbamol (ROBAXIN) injection 500 mg  500 mg Intravenous Q8H Barnetta Chapel, PA-C   500 mg at 04/29/23 0631   naloxone [facility]) injection 0.4 mg  0.4 mg Intravenous PRN Emelia Loron, MD       And   sodium chloride flush (NS) 0.9 % injection 9 mL  9 mL Intravenous PRN Emelia Loron, MD       ondansetron [facility]) injection 4 mg  4 mg Intravenous Q6H PRN Barnetta Chapel, PA-C   4 mg at 04/29/23 1404   Oral care mouth rinse  15 mL Mouth Rinse 4 times per day Glade Lloyd, MD   15 mL at 04/28/23 2119   Oral care mouth rinse  15 mL Mouth Rinse PRN Glade Lloyd, MD  pantoprazole (PROTONIX) injection 40 mg  40 mg Intravenous Q24H Barnetta Chapel, PA-C   40 mg at 04/28/23 1756   piperacillin-tazobactam (ZOSYN) IVPB 3.375 g  3.375 g Intravenous Veryl Speak, MD   Stopped at 04/29/23 1034   promethazine (PHENERGAN) 12.5 mg in sodium chloride 0.9 % 50 mL IVPB  12.5 mg Intravenous Q6H PRN Barnetta Chapel, PA-C       revefenacin (YUPELRI) nebulizer solution 175 mcg  175 mcg Nebulization Daily Barnetta Chapel, PA-C   175 mcg at 04/29/23 1610     Discharge Medications: Please see discharge summary for a list of discharge medications.  Relevant Imaging Results:  Relevant Lab Results:   Additional Information SS# [id number]  Marliss Coots, LCSW

## 2023-04-29 NOTE — TOC Initial Note (Addendum)
Transition of Care [facility]) - Initial/Assessment Note    Patient Details  Name: Kathleen [name] MRN: [id number] Date of Birth: 1946/05/13  Transition of Care [facility] CM/SW Contact:    Marliss Coots, LCSW Phone Number: 04/29/2023, 1:48 PM  Clinical Narrative:                  This CSW introduced herself and role to patient at bedside. CSW inquired about discharge plan. Patient accepted PT's recommendation of SNF. SNF stated that they prefer to discharge to SNF within [facility] (has been to [facility] before; is in different about facility but accepted CSW request to submit fl2 to facility). Patient informed this CSW that prior to admission she resided at [facility] in [place] and received palliative care services through [place] [place]). Patient also informed this CSW of her personal medical equipment (tube for breathing treatment, rollator, shower seat, bipap). CSW offered to contact family and [facility] to inform them of discharge to SNF, which patient accepted. CSW informed patient of Medicare co-pays (100% 1-20 days, 21-100 days $204). Patient expressed understanding of this information.  3:10 PM This CSW called and spoke with patient's family [name] (Son), [name] (Daughter), [facility]; [phone]) to inform them of patient's discharge plan to SNF. A voicemail was left for patient's son, [name]. Family expressed understanding of information. CSW confirmed with Jonny Ruiz that patient's children have been notified of discharge plans. CSW coordinated an in-person meeting time at patient's bedside with [name] tomorrow (before 10:30AM or after 1:00PM). CSW also called front desk (Ameli) of [facility] to notify them of patient's discharge plans. Lauris Poag confirmed that Cathlean Sauer is aware patient will not be returning in the near future due to hospitalization and SNF.  3:59 PM Patient's son, [name], returned this CSW's call. CSW informed [name]  of patient's plan to discharge to SNF. [name] inquired about when and where. This CSW stated that the patient's fl2 has been sent to SNF in [facility] and estimated to have all options to present to patient at bedside by tomorrow. This CSW also stated that an estimated discharge date is still to be determined. [name] inquired about needed "steps" for patient to be able to discharge, and CSW suggested [name] to contact medical team for further explanation. [name] expressed understanding of the following information and suggestions.   Expected Discharge Plan: Skilled Nursing Facility Barriers to Discharge: SNF Pending bed offer, SNF Pending discharge orders, SNF Pending transportation, SNF Pending discharge summary, Continued Medical Work up   Patient Goals and CMS Choice Patient states their goals for this hospitalization and ongoing recovery are:: SNF CMS Medicare.gov Compare Post Acute Care list provided to:: Patient Choice offered to / list presented to : Patient      Expected Discharge Plan and Services In-house Referral: Clinical Social Work   Post Acute Care Choice: Skilled Nursing Facility Living arrangements for the past 2 months: Independent Living Facility [facility])                                      Prior Living Arrangements/Services Living arrangements for the past 2 months: Independent Living Facility [facility]) Lives with:: Facility Resident Patient language and need for interpreter reviewed:: Yes Do you feel safe going back to the place where you live?: Yes      Need for Family Participation in Patient Care: Yes (Comment)  Care giver support system in place?: Yes (comment)   Criminal Activity/Legal Involvement Pertinent to Current Situation/Hospitalization: No - Comment as needed  Activities of Daily Living   ADL Screening (condition at time of admission) Independently performs ADLs?: Yes (appropriate for developmental age) Is the  patient deaf or have difficulty hearing?: No Does the patient have difficulty seeing, even when wearing glasses/contacts?: No Does the patient have difficulty concentrating, remembering, or making decisions?: No  Permission Sought/Granted Permission sought to share information with : Family Supports, Oceanographer granted to share information with : Yes, Verbal Permission Granted  Share Information with NAME: Niasha Muccio  Permission granted to share info w AGENCY: SNF  Permission granted to share info w Relationship: Son  Permission granted to share info w Contact Information: [phone]  Emotional Assessment Appearance:: Appears stated age Attitude/Demeanor/Rapport: Engaged Affect (typically observed): Accepting, Appropriate, Calm, Stable, Pleasant Orientation: : Oriented to Self, Oriented to Place, Oriented to  Time, Oriented to Situation Alcohol / Substance Use: Not Applicable Psych Involvement: No (comment)  Admission diagnosis:  Diverticulitis of colon with perforation [K57.20] Sepsis, due to unspecified organism, unspecified whether acute organ dysfunction present [facility]) [A41.9] Patient Active Problem List   Diagnosis Date Noted   Preop cardiovascular exam 04/23/2023   Diverticulitis of colon with perforation 04/22/2023   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   Pill dysphagia 04/06/2023   COPD exacerbation (HCC) 03/26/2023   Absolute anemia 03/17/2023   LLQ pain 03/17/2023   Chronic nausea 03/17/2023   Hypokalemia 02/23/2023   Diverticulitis 02/22/2023   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   Carpal tunnel syndrome, right upper limb 01/27/2023   Acute exacerbation of chronic obstructive pulmonary disease (COPD) (HCC) 01/13/2023   Dysuria 11/26/2022   Hypercoagulable state due to persistent atrial fibrillation (HCC) 11/18/2022   Acute on chronic heart failure with preserved ejection fraction (HCC) 10/26/2022    COPD with acute exacerbation (HCC) 10/09/2022   Pain in left shoulder 07/23/2022   Chronic obstructive pulmonary disease (HCC) 06/28/2022   Palliative care encounter 06/28/2022   Post-viral cough syndrome 06/02/2022   Paranasal sinus disease 05/19/2022   Heart failure with reduced ejection fraction (HCC) 05/14/2022   Pneumonia 04/29/2022   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   Depression, major, single episode, moderate (HCC) 04/27/2022   Obesity (BMI 30-39.9) 03/18/2022   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   Low TSH level 12/02/2021   Acute idiopathic gout of left hand 11/19/2021   Acquired hypothyroidism 11/12/2021   Encounter for palliative care involving management of pain 11/12/2021   Post concussive syndrome 11/11/2021   Chronic left-sided low back pain 11/04/2021   Gout 10/01/2021   Hyperlipidemia with target LDL less than 100 08/07/2021   Chronic anticoagulation 07/07/2021   History of colon polyps 07/07/2021   Stage 3a chronic kidney disease (HCC) 05/23/2021   Dietary iron deficiency 05/23/2021   Age-related osteoporosis without current pathological fracture 04/22/2021   Type 2 diabetes mellitus with diabetic neuropathy, without long-term current use of insulin (HCC) 04/22/2021   Sleep apnea 01/22/2021   Statin intolerance 10/12/2019   Statin myopathy 02/24/2019   Chronic combined systolic and diastolic heart failure (HCC) 04/06/2018   HTN (hypertension) 04/06/2018   COPD (chronic obstructive pulmonary disease) (HCC) 04/06/2018   Persistent atrial fibrillation (HCC) 04/06/2018   Chronic respiratory failure with hypoxia (HCC) 03/28/2018   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 [place] Otto, [place] - [address] AT [facility] [address] & [facility] [address] [place] [address] Phone: [phone] Fax:  [phone]  [facility] [address] [place] [address] Phone: [phone] Fax: [phone]  [facility] - [place], [place] - [address] [facility] [address] [employer]. [address] [place] [address] Phone: [phone] Fax: [phone]  [facility] [address] [place] [address] Phone: [phone] Fax: [phone]     Social Determinants of Health (SDOH) Social History: SDOH Screenings   Food Insecurity: No Food Insecurity (04/22/2023)  Housing: Low Risk  (04/22/2023)  Transportation Needs: No Transportation Needs (04/22/2023)  Utilities: Not At Risk (04/22/2023)  Alcohol Screen: Low Risk  (05/21/2022)  Depression (PHQ2-9): Low Risk  (05/21/2022)  Financial Resource Strain: Low Risk  (05/21/2022)  Physical Activity: Insufficiently Active (05/21/2022)  Social Connections: Moderately Isolated (05/21/2022)  Stress: No Stress Concern Present (05/21/2022)  Tobacco Use: Medium Risk (04/27/2023)   SDOH Interventions:     Readmission Risk Interventions    04/26/2023    9:51 AM 01/14/2023   11:16 AM 05/04/2022    3:37 PM  Readmission Risk Prevention Plan  Transportation Screening Complete Complete Complete  PCP or Specialist Appt within 5-7 Days  Complete Complete  PCP or Specialist Appt within 3-5 Days Complete    Home Care Screening  Complete Complete  Medication Review (RN CM)  Complete Complete  HRI or Home Care Consult Complete    Palliative Care Screening --    Medication Review (RN Care Manager) Complete

## 2023-04-29 NOTE — Progress Notes (Signed)
PROGRESS NOTE    Kathleen [name]  ZOX:[id number] DOB: 09-11-1945 DOA: 04/22/2023 PCP: [name], MD    Brief Narrative:   Kathleen [name] is a 77 y.o. 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 Outpatient CT of abdomen and pelvis done as per Dr. Meridee Score which showed worsening diverticulitis with thickening of bowels, 1.2 cm localized perforation, 5.6 cm perirectal abscess; in which he recommended to proceed to the ED for further evaluation and management. On presentation, Tmax was 101.8, sodium was 129, WBC count 11.9. She was started on IV antibiotics.  General surgery, GI, cardiology, palliative care were consulted.  TRH consulted for admission for further evaluation and management of recurrent diverticulitis associated with perforation and abscess.  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  Has been treated with multiple rounds of IV followed by oral antibiotics recently with no significant improvement.  Patient with elevated temperature 101.8 F with WBC count 11.9.  Was started on empiric IV antibiotics.  General surgery was consulted and patient underwent exploratory laparotomy with sigmoid colectomy and end left colostomy by Dr. Dwain Sarna  on 04/27/2023. -- General Surgery following, appreciate assistance -- Wound VAC with 40 mL out past 24 hours -- Abdominal drains with 40 mL and 50 mL out past 24 hours -- Discontinue NG tube today, starting clear liquid diet; further advancement per general surgery -- Zosyn, plan 5-day postoperative course per CCS  Hyponatremia -- Na 129>>134>133 -- Furosemide currently on hold  -- BMP daily  Hypokalemia Pleated, potassium 3.5 today, will replete today -- Continue monitor electrolytes daily  Persistent atrial fibrillation on chronic anticoagulation -- Cardiology following, appreciate assistance -- Continue Tikosyn 250 mg p.o. twice daily -- Holding home Xarelto for now, will defer to general surgery when okay to restart  Chronic hypoxic respiratory failure, oxygen dependent Hx COPD/BOOP At baseline on 4-5 L oxygen via nasal cannula. -- Mikael Spray neb daily -- Breo Ellipta 1 puff daily -- Albuterol neb every 2 hours.  Wheezing/shortness of breath  Chronic systolic congestive heart failure, compensated Patient did not tolerate Entresto and beta-blocker due to low blood pressures.  TTE April 2024 with LVEF 35-40%.  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 -- Cardiology following as above -- Holding home spironolactone, torsemide for now -- Strict I's and O's, daily weights  Type 2 diabetes mellitus Diabetic neuropathy --Gabapentin  GERD -- Protonix 40 mg IV every 24 hours  Morbid obesity, class I Body mass index is 34.14 kg/m.  Complicates all facets of care.  Moderate protein calorie malnutrition Nutrition Status: Nutrition Problem: Moderate Malnutrition Etiology: chronic illness Signs/Symptoms: mild fat depletion, mild muscle depletion, energy intake < 75% for > 7 days Interventions: Ensure Enlive (each supplement provides 350kcal and 20 grams of protein), Magic cup, MVI -- Starting clear liquid diet today per general surgery   Physical deconditioning -- PT/OT  recommending SNF placement, TOC consulted  Goals of care  Seen by palliative care, CODE STATUS now changed to DNR.  Overall prognosis remains guarded.    DVT prophylaxis: enoxaparin (LOVENOX) injection 40 mg Start: 04/28/23 0915 SCDs Start: 04/22/23 2228    Code Status: Limited: Do not attempt resuscitation (DNR) -DNR-LIMITED -Do Not Intubate/DNI  Family Communication: Updated daughter present at bedside this morning  Disposition Plan:  Level of care: Progressive Status is: Inpatient Remains inpatient appropriate because: IV antibiotics, starting clear liquid diet today, needs further diet advancement and ultimately SNF placement    Consultants:  General surgery [place] [facility] Palliative care Cardiology  Procedures:  Exploratory laparotomy with sigmoid colectomy and end left colostomy, Dr. Dwain Sarna; 04/27/2023  Antimicrobials:  Zosyn 11/14>> Perioperative cefotetan 11/19   Subjective: Patient seen examined bedside, resting comfortably.  Lying in bed.  Seen by general surgery this morning, NG tube being discontinued and started on a clear liquid diet.  No stool or gas in ostomy bag as of yet.  RN present at bedside this morning.  No specific questions, concerns or complaints at this time.  Denies headache, no dizziness, no chest pain, no palpitations, no shortness of breath, no fever/chills/night sweats, no nausea/vomitus or diarrhea, no focal weakness, no fatigue, no paresthesias.  No acute events overnight per nursing staff.  Objective: Vitals:   04/29/23 0737 04/29/23 0758 04/29/23 0800 04/29/23 0900  BP:  106/70 106/70 123/71  Pulse:  82 85 86  Resp:  19 (!) 23 14  Temp:  98 F (36.7 C)    TempSrc:  Oral    SpO2: 98% 96% 97% 94%  Weight:      Height:        Intake/Output Summary (Last 24 hours) at 04/29/2023 1019 Last data filed at 04/29/2023 0600 Gross per 24 hour  Intake 196.61 ml  Output 1590 ml  Net -1393.39 ml   Filed Weights   04/26/23 0500  04/27/23 1028 04/29/23 0500  Weight: 84 kg 79.4 kg 79.3 kg    Examination:  Physical Exam: GEN: NAD, alert and oriented x 3, obese, ill in appearance HEENT: NCAT, PERRL, EOMI, sclera clear, dry mucous membranes PULM: Breath sounds slightly diminished bilateral bases, no wheezing/crackles, normal respiratory effort without accessory muscle use, on 4 L nasal cannula which is her baseline CV: RRR w/o M/G/R, right IJ central line noted in place GI: abd soft, mild TTP surrounding surgical incision site with wound VAC in place, ostomy noted with no air or stool in collection bag. MSK: + lower extremity peripheral edema, muscle strength globally intact 5/5 bilateral upper/lower extremities NEURO: CN II-XII intact, no focal deficits, sensation to light touch intact PSYCH: normal mood/affect Integumentary: Abdominal surgical incision site/ostomy noted as above, otherwise no other concerning rashes/lesions/wounds noted on exposed skin surfaces     Data Reviewed: I have personally reviewed following labs and imaging studies  CBC: Recent Labs  Lab 04/23/23 0710 04/24/23 0441 04/25/23 0618 04/26/23 0859 04/27/23 1332 04/28/23 0442 04/29/23 0448  WBC 9.9 9.2 7.4 8.1  --  18.7* 19.3*  NEUTROABS 8.1* 7.3 5.9 5.9  --  16.9*  --   HGB 10.0* 10.3* 9.4* 9.6* 13.3 9.3* 9.1*  HCT 31.8* 32.1* 28.8* 31.2* 39.0 29.4* 28.6*  MCV 82.4 81.7 82.5 82.5  --  81.9 82.9  PLT 202 240 235 269  --  333 392   Basic Metabolic Panel: Recent Labs  Lab 04/26/23 0859 04/26/23 1751 04/27/23 0750 04/27/23 1332 04/28/23 0442 04/29/23 0448  NA 129* 131* 132* 133* 134* 133*  K  3.2* 3.8 3.9 3.9 4.0 3.5  CL 94* 91* 97*  --  100 98  CO2 24 28 27   --  25 26  GLUCOSE 158* 138* 128*  --  206* 157*  BUN 8 7* 6*  --  6* 17  CREATININE 1.19* 0.86 0.74  --  0.83 0.97  CALCIUM 9.2 9.2 8.4*  --  8.2* 7.9*  MG 1.8 2.7* 2.2  --  2.2 2.4   GFR: Estimated Creatinine Clearance: 46 mL/min (by C-G formula based on SCr of  0.97 mg/dL). Liver Function Tests: Recent Labs  Lab 04/22/23 1840 04/23/23 0710 04/24/23 0441  AST 28 19 19   ALT 21 19 20   ALKPHOS 57 44 52  BILITOT 1.2* 1.1 0.8  PROT 6.4* 5.9* 6.2*  ALBUMIN 2.9* 2.6* 2.6*   Recent Labs  Lab 04/22/23 1840  LIPASE 30   No results for input(s): "AMMONIA" in the last 168 hours. Coagulation Profile: Recent Labs  Lab 04/22/23 1840  INR 1.7*   Cardiac Enzymes: No results for input(s): "CKTOTAL", "CKMB", "CKMBINDEX", "TROPONINI" in the last 168 hours. BNP (last 3 results) No results for input(s): "PROBNP" in the last 8760 hours. HbA1C: No results for input(s): "HGBA1C" in the last 72 hours. CBG: Recent Labs  Lab 04/28/23 0751 04/28/23 1946 04/28/23 2321 04/29/23 0340 04/29/23 0756  GLUCAP 181* 172* 152* 160* 144*   Lipid Profile: No results for input(s): "CHOL", "HDL", "LDLCALC", "TRIG", "CHOLHDL", "LDLDIRECT" in the last 72 hours. Thyroid Function Tests: No results for input(s): "TSH", "T4TOTAL", "FREET4", "T3FREE", "THYROIDAB" in the last 72 hours. Anemia Panel: No results for input(s): "VITAMINB12", "FOLATE", "FERRITIN", "TIBC", "IRON", "RETICCTPCT" in the last 72 hours. Sepsis Labs: Recent Labs  Lab 04/22/23 1905 04/22/23 2211 04/23/23 0114  LATICACIDVEN 1.8 1.1 0.8    Recent Results (from the past 240 hour(s))  Resp panel by RT-PCR (RSV, Flu A&B, Covid) Anterior Nasal Swab     Status: None   Collection Time: 04/22/23  6:38 PM   Specimen: Anterior Nasal Swab  Result Value Ref Range Status   SARS Coronavirus 2 by RT PCR NEGATIVE NEGATIVE Final   Influenza A by PCR NEGATIVE NEGATIVE Final   Influenza B by PCR NEGATIVE NEGATIVE Final    Comment: (NOTE) The Xpert Xpress SARS-CoV-2/FLU/RSV plus assay is intended as an aid in the diagnosis of influenza from Nasopharyngeal swab specimens and should not be used as a sole basis for treatment. Nasal washings and aspirates are unacceptable for Xpert Xpress  SARS-CoV-2/FLU/RSV testing.  Fact Sheet for Patients: BloggerCourse.com  Fact Sheet for Healthcare Providers: SeriousBroker.it  This test is not yet approved or cleared by the [place] FDA and has been authorized for detection and/or diagnosis of SARS-CoV-2 by FDA under an Emergency Use Authorization (EUA). This EUA will remain in effect (meaning this test can be used) for the duration of the COVID-19 declaration under Section 564(b)(1) of the Act, 21 U.S.C. section 360bbb-3(b)(1), unless the authorization is terminated or revoked.     Resp Syncytial Virus by PCR NEGATIVE NEGATIVE Final    Comment: (NOTE) Fact Sheet for Patients: BloggerCourse.com  Fact Sheet for Healthcare Providers: SeriousBroker.it  This test is not yet approved or cleared by the [place] FDA and has been authorized for detection and/or diagnosis of SARS-CoV-2 by FDA under an Emergency Use Authorization (EUA). This EUA will remain in effect (meaning this test can be used) for the duration of the COVID-19 declaration under Section 564(b)(1) of the Act, 21 U.S.C. section  360bbb-3(b)(1), unless the authorization is terminated or revoked.  Performed at [facility], [address], [place], [place] [address]   Blood Culture (routine x 2)     Status: None   Collection Time: 04/22/23  6:40 PM   Specimen: BLOOD RIGHT ARM  Result Value Ref Range Status   Specimen Description BLOOD RIGHT ARM  Final   Special Requests   Final    BOTTLES DRAWN AEROBIC AND ANAEROBIC Blood Culture adequate volume   Culture   Final    NO GROWTH 5 DAYS Performed at [facility], [address], [place], [place] [address]    Report Status 04/27/2023 FINAL  Final  Blood Culture (routine x 2)     Status: None   Collection Time: 04/22/23  7:08 PM   Specimen: BLOOD LEFT ARM  Result Value Ref Range Status    Specimen Description BLOOD LEFT ARM  Final   Special Requests   Final    BOTTLES DRAWN AEROBIC AND ANAEROBIC Blood Culture adequate volume   Culture   Final    NO GROWTH 5 DAYS Performed at [facility], [address] [place], [place] [address]    Report Status 04/27/2023 FINAL  Final  Surgical PCR screen     Status: None   Collection Time: 04/27/23 12:21 AM   Specimen: Nasal Mucosa; Nasal Swab  Result Value Ref Range Status   MRSA, PCR NEGATIVE NEGATIVE Final   Staphylococcus aureus NEGATIVE NEGATIVE Final    Comment: (NOTE) The Xpert SA Assay (FDA approved for NASAL specimens in patients 13 years of age and older), is one component of a comprehensive surveillance program. It is not intended to diagnose infection nor to guide or monitor treatment. Performed at [facility], [address], [place], [place] [address]          Radiology Studies: No results found.      Scheduled Meds:  Chlorhexidine Gluconate Cloth  6 each Topical Daily   dofetilide  250 mcg Oral Q12H   enoxaparin (LOVENOX) injection  40 mg Subcutaneous Q24H   feeding supplement  1 Container Oral TID BM   fluticasone furoate-vilanterol  1 puff Inhalation Daily   HYDROmorphone   Intravenous Q4H   methocarbamol (ROBAXIN) injection  500 mg Intravenous Q8H   mouth rinse  15 mL Mouth Rinse 4 times per day   pantoprazole (PROTONIX) IV  40 mg Intravenous Q24H   revefenacin  175 mcg Nebulization Daily   Continuous Infusions:  piperacillin-tazobactam (ZOSYN)  IV 3.375 g (04/29/23 2956)   potassium chloride 10 mEq (04/29/23 1005)   promethazine (PHENERGAN) injection (IM or IVPB)       LOS: 7 days    Time spent: 52 minutes spent on chart review, discussion with nursing staff, consultants, updating family and interview/physical exam; more than 50% of that time was spent in counseling and/or coordination of care.    Alvira Philips [place], DO [facility] Available via Epic secure chat  7am-7pm After these hours, please refer to coverage provider listed on [email or web address] 04/29/2023, 10:19 AM

## 2023-04-29 NOTE — Plan of Care (Signed)
  Problem: Education: Goal: Ability to describe self-care measures that may prevent or decrease complications (Diabetes Survival Skills Education) will improve Outcome: Progressing Goal: Individualized Educational Video(s) Outcome: Progressing   Problem: Coping: Goal: Ability to adjust to condition or change in health will improve Outcome: Progressing   Problem: Fluid Volume: Goal: Ability to maintain a balanced intake and output will improve Outcome: Progressing   Problem: Health Behavior/Discharge Planning: Goal: Ability to identify and utilize available resources and services will improve Outcome: Progressing Goal: Ability to manage health-related needs will improve Outcome: Progressing   Problem: Metabolic: Goal: Ability to maintain appropriate glucose levels will improve Outcome: Progressing   Problem: Nutritional: Goal: Maintenance of adequate nutrition will improve Outcome: Progressing Goal: Progress toward achieving an optimal weight will improve Outcome: Progressing   Problem: Skin Integrity: Goal: Risk for impaired skin integrity will decrease Outcome: Progressing   Problem: Tissue Perfusion: Goal: Adequacy of tissue perfusion will improve Outcome: Progressing   Problem: Education: Goal: Knowledge of General Education information will improve Description: Including pain rating scale, medication(s)/side effects and non-pharmacologic comfort measures Outcome: Progressing   Problem: Health Behavior/Discharge Planning: Goal: Ability to manage health-related needs will improve Outcome: Progressing   Problem: Clinical Measurements: Goal: Ability to maintain clinical measurements within normal limits will improve Outcome: Progressing Goal: Will remain free from infection Outcome: Progressing Goal: Diagnostic test results will improve Outcome: Progressing Goal: Cardiovascular complication will be avoided Outcome: Progressing   Problem: Activity: Goal: Risk  for activity intolerance will decrease Outcome: Progressing   Problem: Nutrition: Goal: Adequate nutrition will be maintained Outcome: Progressing   Problem: Coping: Goal: Level of anxiety will decrease Outcome: Progressing   Problem: Elimination: Goal: Will not experience complications related to bowel motility Outcome: Progressing Goal: Will not experience complications related to urinary retention Outcome: Progressing   Problem: Pain Management: Goal: General experience of comfort will improve Outcome: Progressing   Problem: Safety: Goal: Ability to remain free from injury will improve Outcome: Progressing   Problem: Skin Integrity: Goal: Risk for impaired skin integrity will decrease Outcome: Progressing   Problem: Education: Goal: Understanding of discharge needs will improve Outcome: Progressing Goal: Verbalization of understanding of the causes of altered bowel function will improve Outcome: Progressing   Problem: Activity: Goal: Ability to tolerate increased activity will improve Outcome: Progressing   Problem: Bowel/Gastric: Goal: Gastrointestinal status for postoperative course will improve Outcome: Progressing   Problem: Health Behavior/Discharge Planning: Goal: Identification of community resources to assist with postoperative recovery needs will improve Outcome: Progressing   Problem: Nutritional: Goal: Will attain and maintain optimal nutritional status will improve Outcome: Progressing   Problem: Clinical Measurements: Goal: Postoperative complications will be avoided or minimized Outcome: Progressing   Problem: Respiratory: Goal: Respiratory status will improve Outcome: Progressing   Problem: Skin Integrity: Goal: Will show signs of wound healing Outcome: Progressing

## 2023-04-29 NOTE — Progress Notes (Addendum)
2 Days Post-Op   Subjective/Chief Complaint: Sore some delirium in icu was in chair much of yesterday   Objective: Vital signs in last 24 hours: Temp:  [97.5 F (36.4 C)-98.9 F (37.2 C)] 98.2 F (36.8 C) (11/21 0341) Pulse Rate:  [70-97] 91 (11/21 0600) Resp:  [17-37] 21 (11/21 0600) BP: (92-139)/(52-87) 122/68 (11/21 0600) SpO2:  [93 %-100 %] 96 % (11/21 0600) FiO2 (%):  [32 %] 32 % (11/21 0412) Weight:  [79.3 kg] 79.3 kg (11/21 0500) Last BM Date : 04/26/23  Intake/Output from previous day: 11/20 0701 - 11/21 0700 In: 241.8 [IV Piggyback:241.8] Out: 1815 [Urine:725; Emesis/NG output:900; Drains:190] Intake/Output this shift: No intake/output data recorded.  Ab soft approp tender vac in place, drains serosang  Lab Results:  Recent Labs    04/28/23 0442 04/29/23 0448  WBC 18.7* 19.3*  HGB 9.3* 9.1*  HCT 29.4* 28.6*  PLT 333 392   BMET Recent Labs    04/28/23 0442 04/29/23 0448  NA 134* 133*  K 4.0 3.5  CL 100 98  CO2 25 26  GLUCOSE 206* 157*  BUN 6* 17  CREATININE 0.83 0.97  CALCIUM 8.2* 7.9*   PT/INR No results for input(s): "LABPROT", "INR" in the last 72 hours. ABG Recent Labs    04/27/23 1332  PHART 7.323*  HCO3 25.5    Studies/Results: No results found.  Anti-infectives: Anti-infectives (From admission, onward)    Start     Dose/Rate Route Frequency Ordered Stop   04/27/23 1021  sodium chloride 0.9 % with cefoTEtan (CEFOTAN) ADS Med       Note to Pharmacy: Shanda Bumps M: cabinet override      04/27/23 1021 04/27/23 1253   04/26/23 0800  cefoTEtan (CEFOTAN) 2 g in sodium chloride 0.9 % 100 mL IVPB  Status:  Discontinued        2 g 200 mL/hr over 30 Minutes Intravenous To ShortStay Surgical 04/23/23 2111 04/27/23 0800   04/22/23 2300  piperacillin-tazobactam (ZOSYN) IVPB 3.375 g        3.375 g 12.5 mL/hr over 240 Minutes Intravenous Every 8 hours 04/22/23 2256 05/02/23 2359   04/22/23 1900  piperacillin-tazobactam (ZOSYN) IVPB  3.375 g        3.375 g 100 mL/hr over 30 Minutes Intravenous  Once 04/22/23 1852 04/22/23 1954       Assessment/Plan: POD 2 elap, sigmoid colectomy, colostomy- MW -expect some drainage from vagina although this appears to have abated -dc ng tube today, can have some clears-not sure when she will be able to eat well so I am fine with starting tpn -leave foley for another 24 hours due to pelvic surgery -five days zosyn postop -Cr normal, Hct lower as expected will follow in am -would like to wait another 24 hours off anticoagulation -can tx out of icu from my standpoint -oob, pulm toilet -vac change tomorrow  Kathleen [name] 04/29/2023

## 2023-04-30 ENCOUNTER — Inpatient Hospital Stay (HOSPITAL_COMMUNITY): Payer: Medicare Other

## 2023-04-30 ENCOUNTER — Other Ambulatory Visit: Payer: Self-pay

## 2023-04-30 DIAGNOSIS — K5720 Diverticulitis of large intestine with perforation and abscess without bleeding: Secondary | ICD-10-CM | POA: Diagnosis not present

## 2023-04-30 LAB — PHOSPHORUS: Phosphorus: 1.9 mg/dL — ABNORMAL LOW (ref 2.5–4.6)

## 2023-04-30 LAB — BASIC METABOLIC PANEL
Anion gap: 10 (ref 5–15)
BUN: 15 mg/dL (ref 8–23)
CO2: 26 mmol/L (ref 22–32)
Calcium: 7.7 mg/dL — ABNORMAL LOW (ref 8.9–10.3)
Chloride: 100 mmol/L (ref 98–111)
Creatinine, Ser: 0.83 mg/dL (ref 0.44–1.00)
GFR, Estimated: >60 mL/min (ref >60)
Glucose, Bld: 143 mg/dL — ABNORMAL HIGH (ref 70–99)
Potassium: 4.4 mmol/L (ref 3.5–5.1)
Sodium: 136 mmol/L (ref 135–145)

## 2023-04-30 LAB — GLUCOSE, CAPILLARY
Glucose-Capillary: 107 mg/dL — ABNORMAL HIGH (ref 70–99)
Glucose-Capillary: 120 mg/dL — ABNORMAL HIGH (ref 70–99)
Glucose-Capillary: 126 mg/dL — ABNORMAL HIGH (ref 70–99)
Glucose-Capillary: 173 mg/dL — ABNORMAL HIGH (ref 70–99)
Glucose-Capillary: 210 mg/dL — ABNORMAL HIGH (ref 70–99)

## 2023-04-30 LAB — CBC
HCT: 27.6 % — ABNORMAL LOW (ref 36.0–46.0)
Hemoglobin: 8.5 g/dL — ABNORMAL LOW (ref 12.0–15.0)
MCH: 25.8 pg — ABNORMAL LOW (ref 26.0–34.0)
MCHC: 30.8 g/dL (ref 30.0–36.0)
MCV: 83.6 fL (ref 80.0–100.0)
Platelets: 374 10*3/uL (ref 150–400)
RBC: 3.3 MIL/uL — ABNORMAL LOW (ref 3.87–5.11)
RDW: 15.4 % (ref 11.5–15.5)
WBC: 18.9 10*3/uL — ABNORMAL HIGH (ref 4.0–10.5)
nRBC: 0.2 % (ref 0.0–0.2)

## 2023-04-30 LAB — MAGNESIUM: Magnesium: 2.4 mg/dL (ref 1.7–2.4)

## 2023-04-30 MED ORDER — SODIUM PHOSPHATES 45 MMOLE/15ML IV SOLN
30.0000 mmol | Freq: Once | INTRAVENOUS | Status: AC
  Administered 2023-04-30: 30 mmol via INTRAVENOUS
  Filled 2023-04-30: qty 10

## 2023-04-30 MED ORDER — INSULIN ASPART 100 UNIT/ML IJ SOLN
0.0000 [IU] | INTRAMUSCULAR | Status: DC
  Administered 2023-04-30: 3 [IU] via SUBCUTANEOUS
  Administered 2023-04-30: 5 [IU] via SUBCUTANEOUS
  Administered 2023-04-30: 2 [IU] via SUBCUTANEOUS
  Administered 2023-05-01 (×5): 3 [IU] via SUBCUTANEOUS
  Administered 2023-05-02 (×5): 2 [IU] via SUBCUTANEOUS
  Administered 2023-05-02: 3 [IU] via SUBCUTANEOUS
  Administered 2023-05-03: 2 [IU] via SUBCUTANEOUS
  Administered 2023-05-03: 3 [IU] via SUBCUTANEOUS
  Administered 2023-05-03: 5 [IU] via SUBCUTANEOUS
  Administered 2023-05-03 (×2): 3 [IU] via SUBCUTANEOUS
  Administered 2023-05-04: 2 [IU] via SUBCUTANEOUS
  Administered 2023-05-04: 5 [IU] via SUBCUTANEOUS
  Administered 2023-05-04 (×2): 2 [IU] via SUBCUTANEOUS
  Administered 2023-05-05: 3 [IU] via SUBCUTANEOUS
  Administered 2023-05-05: 2 [IU] via SUBCUTANEOUS
  Administered 2023-05-05 (×2): 3 [IU] via SUBCUTANEOUS
  Administered 2023-05-06 (×2): 2 [IU] via SUBCUTANEOUS
  Administered 2023-05-06: 3 [IU] via SUBCUTANEOUS
  Administered 2023-05-06 – 2023-05-07 (×7): 2 [IU] via SUBCUTANEOUS
  Administered 2023-05-07: 3 [IU] via SUBCUTANEOUS
  Administered 2023-05-07: 2 [IU] via SUBCUTANEOUS
  Administered 2023-05-08: 3 [IU] via SUBCUTANEOUS
  Administered 2023-05-08: 2 [IU] via SUBCUTANEOUS
  Administered 2023-05-08: 3 [IU] via SUBCUTANEOUS
  Administered 2023-05-08 – 2023-05-10 (×10): 2 [IU] via SUBCUTANEOUS
  Administered 2023-05-10: 3 [IU] via SUBCUTANEOUS
  Administered 2023-05-10: 2 [IU] via SUBCUTANEOUS
  Administered 2023-05-11 (×2): 3 [IU] via SUBCUTANEOUS
  Administered 2023-05-11 – 2023-05-12 (×2): 2 [IU] via SUBCUTANEOUS

## 2023-04-30 MED ORDER — TRACE MINERALS CU-MN-SE-ZN 300-55-60-3000 MCG/ML IV SOLN
INTRAVENOUS | Status: AC
  Filled 2023-04-30 (×2): qty 1000

## 2023-04-30 MED ORDER — ACETAMINOPHEN 10 MG/ML IV SOLN
1000.0000 mg | Freq: Once | INTRAVENOUS | Status: AC
  Administered 2023-04-30: 1000 mg via INTRAVENOUS
  Filled 2023-04-30: qty 100

## 2023-04-30 MED ORDER — FAT EMUL FISH OIL/PLANT BASED 20% (SMOFLIPID)IV EMUL
250.0000 mL | INTRAVENOUS | Status: AC
  Administered 2023-04-30: 250 mL via INTRAVENOUS
  Filled 2023-04-30: qty 250

## 2023-04-30 MED ORDER — SODIUM CHLORIDE 0.9% FLUSH
10.0000 mL | INTRAVENOUS | Status: DC | PRN
  Administered 2023-05-05: 10 mL
  Administered 2023-05-19: 20 mL

## 2023-04-30 NOTE — Progress Notes (Signed)
PHARMACY - TOTAL PARENTERAL NUTRITION CONSULT NOTE   Indication: Prolonged ileus  Patient Measurements: Height: 5' (152.4 cm) Weight: 79.3 kg (174 lb 13.2 oz) IBW/kg (Calculated) : 45.5 TPN AdjBW (KG): 54 Body mass index is 34.14 kg/m.   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 Patient has been NPO since surgery.   Glucose / Insulin: CBGs <180.  Electrolytes: Phos low at 1.9 - receiving NaPhos x1, CoCa 8.8. Others within normal limits.  Renal: SCr 0.83 - stable.  Hepatic: LFTs/Tbili on 11/16 within normal limits.  Intake / Output; MIVF: UOP 0.4 ml/kg/hr. R-RLQ JP drain 31 mL; R-RUQ JP drain 43 mL. LBM 11/18. Colostomy output - 0 mL GI Imaging: 11/21 KUB - pending GI Surgeries / Procedures:  11/19 ex lap with sigmoid colectomy and end left colostomy  Central access: PICC placed 04/30/23 TPN start date: 04/30/23  Nutritional Goals: Clinimix E 8/10 1L at 42 ml/hr with SMOF lipids + D10 at 42 ml/hr on MTWThFS Clinimix 8/10 with additive lytes 2L at 83 ml/hr on Sun - Average of 1551 kcals per day (97% of goal) and 91g protein per day (100% goal)  Electrolytes in 8/10 E 1000 mL: Na 35 mEq, K 30 mEq, Mag 5 mEq, Ca 5 mEq, Phos , Acetate 83, Cl 76 mEq Electrolytes in Clinimix E 2L bag: Na , K , Mag , Ca , Phos , Ac , Cl .   RD Assessment: Estimated Needs Total Energy Estimated Needs: 1600-1800 kcal Total Protein Estimated Needs: 75-95 gm Total Fluid Estimated Needs: >1.6L  Current Nutrition:  NPO except for ice chips  Plan:  Start Clinimix E 8/10 1L at 42 ml/hr with SMOF lipids 20.8 ml/hr x12 hours today - meeting ~50% of needs.  Electrolytes in 8/10 E 1000 mL: Na 35 mEq, K 30 mEq, Mag 5 mEq, Ca 5  mEq, Phos , Acetate 83, Cl 76 mEq  Add standard MVI and trace elements to TPN Initiate Moderate q4h SSI and adjust as needed  Monitor TPN labs daily on Clinimix.   Link Snuffer, PharmD, BCPS, BCCCP Please refer to [facility] for [facility] Pharm[facility] numbers 04/30/2023,7:35 AM

## 2023-04-30 NOTE — Progress Notes (Signed)
Physical Therapy Treatment Patient Details Name: Kathleen [name] MRN: [id number] DOB: 07/17/1945 Today's Date: 04/30/2023   History of Present Illness 77 y.o. 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 S/p exploratory laparotomy with sigmoid colectomy and end left colostomy by Dr. Dwain Sarna on 04/27/2023.  PMHx: colovaginal fistula associated with diverticulitis, obesity, OSA , BOOP, tracheobronchomalacia, chronic respiratory failure on 4 to 5 L oxygen at baseline, persistent A-fib S/P pacemaker implant 2017, chronic systolic heart failure, recent admission from 04/09/2023 to 04/13/2023.    PT Comments  The pt was received in supine with 4L [place] and agreeable to PT session. The pt was able to transfer from bed to chair via step pivot. The pt was able to step pivot without an AD and CGA; however the pt used the bed rails for support with lateral steps. The pt was able to complete bed mobility with increased time and the use of the bed rails.PT treatment was limited by pt pain and fatigue. Pt continues to recommend short term acute PT services to address problem list below. The pt will continue to benefit from skilled PT to address remaining functional deficits.   If plan is discharge home, recommend the following: A lot of help with walking and/or transfers;A lot of help with bathing/dressing/bathroom;Assistance with cooking/housework;Assist for transportation   Can travel by private vehicle     No  Equipment Recommendations  None recommended by PT    Recommendations for Other Services       Precautions / Restrictions Precautions Precautions: Fall Precaution Comments: multiple drains, NG, wound vac Required Braces or Orthoses: Other Brace (abdominal binder) Other Brace: Abdominal binder Restrictions Weight Bearing Restrictions: No     Mobility  Bed Mobility Overal bed mobility: Modified Independent (used bed rails) Bed Mobility: Rolling,  Sidelying to Sit Rolling: Modified independent (Device/Increase time) Sidelying to sit: Modified independent (Device/Increase time)       General bed mobility comments: Pt required increased time and use of bed rails    Transfers Overall transfer level: Needs assistance Equipment used: None Transfers: Sit to/from Stand Sit to Stand: Contact guard assist   Step pivot transfers: Contact guard assist       General transfer comment: Pt required CGA and the use of the bed rails for all transfers.    Ambulation/Gait                   Stairs             Wheelchair Mobility     Tilt Bed    Modified Rankin (Stroke Patients Only)       Balance Overall balance assessment: Needs assistance Sitting-balance support: Single extremity supported, Feet supported Sitting balance-Leahy Scale: Fair     Standing balance support: Single extremity supported, During functional activity (Pt held onto the bedrail) Standing balance-Leahy Scale: Fair                              Cognition Arousal: Alert Behavior During Therapy: WFL for tasks assessed/performed Overall Cognitive Status: Within Functional Limits for tasks assessed                                          Exercises      General Comments General comments (skin integrity, edema, etc.): VSS on 4L [place]  Pertinent Vitals/Pain Pain Assessment Pain Assessment: 0-10 Pain Score: 8  Pain Location: Back Pain Descriptors / Indicators: Grimacing, Discomfort Pain Intervention(s): Monitored during session    Home Living                          Prior Function            PT Goals (current goals can now be found in the care plan section) Acute Rehab PT Goals Patient Stated Goal: Reduce pain PT Goal Formulation: With patient Time For Goal Achievement: 05/12/23 Potential to Achieve Goals: Good Progress towards PT goals: Progressing toward goals     Frequency    Min 1X/week      PT Plan      Co-evaluation              AM-PAC PT "6 Clicks" Mobility   Outcome Measure  Help needed turning from your back to your side while in a flat bed without using bedrails?: None Help needed moving from lying on your back to sitting on the side of a flat bed without using bedrails?: A Little Help needed moving to and from a bed to a chair (including a wheelchair)?: A Little Help needed standing up from a chair using your arms (e.g., wheelchair or bedside chair)?: A Little Help needed to walk in hospital room?: A Lot Help needed climbing 3-5 steps with a railing? : Total 6 Click Score: 16    End of Session Equipment Utilized During Treatment: Gait belt;Oxygen Activity Tolerance: Patient limited by pain Patient left: in chair;with call bell/phone within reach;with chair alarm set;with family/visitor present Nurse Communication: Mobility status PT Visit Diagnosis: Unsteadiness on feet (R26.81);Muscle weakness (generalized) (M62.81);Difficulty in walking, not elsewhere classified (R26.2);Pain Pain - part of body:  (Back)     Time: 7846-9629 PT Time Calculation (min) (ACUTE ONLY): 43 min  Charges:    $Therapeutic Activity: 23-37 mins PT General Charges $$ ACUTE PT VISIT: 1 Visit          Caryl Comes, SPT Acute Rehabilitation Office Phone [phone]    Caryl Comes 04/30/2023, 3:39 PM

## 2023-04-30 NOTE — Progress Notes (Addendum)
0900 patient refusing to move to have EKG completed education to patient and patients daughter on Tikosyn and its needed before administering this medication patient still refuses 0935 patient refusing to have PICC line placed daughter at bedside informed DR. 1145 discussion with NP and patient after PICC line NG needs to be placed patient agreeable for that.  1230 patient transferred by bed to 5 west bed 1 daughter at bedside patient ion 3L [place] alert x4

## 2023-04-30 NOTE — Progress Notes (Signed)
Patient has NG tube in place so BIPAP cannot be used at this time. No unit in room at this time.

## 2023-04-30 NOTE — Progress Notes (Signed)
Peripherally Inserted Central Catheter Placement  The IV Nurse has discussed with the patient and/or persons authorized to consent for the patient, the purpose of this procedure and the potential benefits and risks involved with this procedure.  The benefits include less needle sticks, lab draws from the catheter, and the patient may be discharged home with the catheter. Risks include, but not limited to, infection, bleeding, blood clot (thrombus formation), and puncture of an artery; nerve damage and irregular heartbeat and possibility to perform a PICC exchange if needed/ordered by physician.  Alternatives to this procedure were also discussed.  Bard Power PICC patient education guide, fact sheet on infection prevention and patient information card has been provided to patient /or left at bedside.    PICC Placement Documentation  PICC Double Lumen 04/30/23 Right Basilic 38 cm 0 cm (Active)  Indication for Insertion or Continuance of Line Administration of hyperosmolar/irritating solutions (i.e. TPN, Vancomycin, etc.) 04/30/23 1520  Exposed Catheter (cm) 0 cm 04/30/23 1520  Site Assessment Clean, Dry, Intact 04/30/23 1520  Lumen #1 Status Flushed;Saline locked;Blood return noted 04/30/23 1520  Lumen #2 Status Flushed;Saline locked;Blood return noted 04/30/23 1520  Dressing Type Transparent;Securing device 04/30/23 1520  Dressing Status Antimicrobial disc in place;Clean, Dry, Intact 04/30/23 1520  Line Care Connections checked and tightened 04/30/23 1520  Line Adjustment (NICU/IV Team Only) No 04/30/23 1520  Dressing Intervention New dressing;Adhesive placed at insertion site (IV team only) 04/30/23 1520  Dressing Change Due 05/07/23 04/30/23 1520    Consent signed by Daughter at bedside   Reginia Forts Albarece 04/30/2023, 3:22 PM

## 2023-04-30 NOTE — Progress Notes (Signed)
Went to place a PICC line. Consent signed by daughter at bedside. Patient is in pain at this time, cannot lay flat in bed. Received pain medicine an hour ago per primary RN. RN to let PICC team aware when patient is ready for PICC placement.

## 2023-04-30 NOTE — Progress Notes (Addendum)
Nutrition Follow-up / Consult (New TPN)  DOCUMENTATION CODES:   Non-severe (moderate) malnutrition in context of chronic illness  INTERVENTION:   TPN orders per Pharmacy to meet 100% of estimated nutrition needs as able.   Once diet advanced, recommend addition of Ensure Enlive po TID between meals, each supplement provides 350 kcal and 20 grams of protein.  Once utilizing GI tract, recommend MVI with Minerals  NUTRITION DIAGNOSIS:   Moderate Malnutrition related to chronic illness as evidenced by mild fat depletion, mild muscle depletion, energy intake < 75% for > 7 days.  Ongoing   GOAL:   Patient will meet greater than or equal to 90% of their needs  Progressing with initiation of TPN  MONITOR:   PO intake, Supplement acceptance, Diet advancement, Labs, Weight trends  REASON FOR ASSESSMENT:   Consult New TPN/TNA  ASSESSMENT:   77 y.o. F, admitted with PMH of hypothyroidism, COPD,OSA (on BiPAP), BOOP (noncandidate for stenting), diverticulitis, CHF, obesity, HTN, gout, and colovaginal fistula. Presented 11/14 for worsening diverticulitis with new bowel perforation.  11/19: S/P ex lap, sigmoid colectomy, colostomy. VAC changed today by [facility] team. Colostomy pouch with small amount of pink drainage in pouch.  JP drain x 2 with 74 ml output x 24 hours, none documented today. Patient did not tolerate clear liquid diet yesterday, so she was made NPO this morning. Plans to start TPN, however, patient refused PICC placement today because she could not lay on her back d/t pain. IV team to attempt PICC placement later today. TPN plan per Pharmacy: Clinimix E 8/10 at 42 ml/h with SMOF lipids + D10 at 42 ml/h on MTWThFS Clinimix 8/10 with additive lytes at 83 ml/h on Sunday Provides an average of 1551 kcal and 91 gm protein per day to meet 97% of minimum estimated kcal needs and 100% of estimated protein needs.   Labs reviewed. Phos 1.9 CBG: 126-107  Medications reviewed and  include novolog, protonix, IV sodium phosphate.  Diet Order:   Diet Order             Diet NPO time specified Except for: Ice Chips  Diet effective now                   EDUCATION NEEDS:   Education needs have been addressed  Skin:  Skin Assessment: Skin Integrity Issues: Skin Integrity Issues:: Wound VAC Wound Vac: abdomen Other: new colostomy (11/18)  Last BM:  11/18 (prior to colostomy), no stool yet via colostomy  Height:   Ht Readings from Last 1 Encounters:  04/27/23 5' (1.524 m)    Weight:   Wt Readings from Last 1 Encounters:  04/30/23 79.3 kg    Ideal Body Weight:  45.45 kg  BMI:  Body mass index is 34.14 kg/m.  Estimated Nutritional Needs:   Kcal:  1600-1800 kcal  Protein:  75-95 gm  Fluid:  >1.6L   Gabriel Rainwater RD, LDN, CNSC Please refer to Amion for contact information.

## 2023-04-30 NOTE — Progress Notes (Signed)
3 Days Post-Op   Subjective/Chief Complaint: Sleepy, some ab pain, some nausea   Objective: Vital signs in last 24 hours: Temp:  [97.8 F (36.6 C)-98 F (36.7 C)] 97.8 F (36.6 C) (11/21 1509) Pulse Rate:  [64-86] 74 (11/22 0600) Resp:  [14-26] 17 (11/22 0600) BP: (90-135)/(41-76) 93/50 (11/22 0600) SpO2:  [94 %-100 %] 100 % (11/22 0600) Weight:  [79.3 kg] 79.3 kg (11/22 0500) Last BM Date : 04/26/23  Intake/Output from previous day: 11/21 0701 - 11/22 0700 In: 833.4 [P.O.:360; IV Piggyback:473.4] Out: 847 [Urine:773; Drains:74] Intake/Output this shift: No intake/output data recorded.  Ab mild distended, drains serosang, ostomy pink nothing out today  Lab Results:  Recent Labs    04/29/23 0448 04/30/23 0639  WBC 19.3* 18.9*  HGB 9.1* 8.5*  HCT 28.6* 27.6*  PLT 392 374   BMET Recent Labs    04/29/23 0448 04/29/23 1712  NA 133* 135  K 3.5 3.7  CL 98 97*  CO2 26 26  GLUCOSE 157* 135*  BUN 17 21  CREATININE 0.97 0.88  CALCIUM 7.9* 7.7*   PT/INR No results for input(s): "LABPROT", "INR" in the last 72 hours. ABG Recent Labs    04/27/23 1332  PHART 7.323*  HCO3 25.5    Studies/Results: [place] EKG SITE RITE  Result Date: 04/30/2023 If Site Rite image not attached, placement could not be confirmed due to current cardiac rhythm.   Anti-infectives: Anti-infectives (From admission, onward)    Start     Dose/Rate Route Frequency Ordered Stop   04/27/23 1021  sodium chloride 0.9 % with cefoTEtan (CEFOTAN) ADS Med       Note to Pharmacy: Shanda Bumps M: cabinet override      04/27/23 1021 04/27/23 1253   04/26/23 0800  cefoTEtan (CEFOTAN) 2 g in sodium chloride 0.9 % 100 mL IVPB  Status:  Discontinued        2 g 200 mL/hr over 30 Minutes Intravenous To [facility] 04/23/23 2111 04/27/23 0800   04/22/23 2300  piperacillin-tazobactam (ZOSYN) IVPB 3.375 g        3.375 g 12.5 mL/hr over 240 Minutes Intravenous Every 8 hours 04/22/23 2256 05/02/23  2359   04/22/23 1900  piperacillin-tazobactam (ZOSYN) IVPB 3.375 g        3.375 g 100 mL/hr over 30 Minutes Intravenous  Once 04/22/23 1852 04/22/23 1954       Assessment/Plan: POD 3 elap, sigmoid colectomy, colostomy- MW -expect some drainage from vagina although this appears to have abated -possibly has ileus, check xray may need ng replaced -remove foley today, purewick fine -five days zosyn postop -would like to wait another 24 hours off anticoagulation if hb stable can start tomorrow after vac change also -can tx out of icu from my standpoint -oob, pulm toilet -vac change today -start tpn- cvl removed so will need picc  Emelia Loron 04/30/2023

## 2023-04-30 NOTE — TOC Progression Note (Signed)
Transition of Care [facility] - Progression Note    Patient Details  Name: Kathleen [name] MRN: [id number] Date of Birth: [birth year]  [facility] CM/SW Contact  Mearl Latin, LCSW Phone Number: 04/30/2023, 4:41 PM  Clinical Narrative:    Patient transferred to 5W. CSW received notification from DJ with [facility] that he has been involved and spoke with family today and is awaiting their decision.    Expected Discharge Plan: Skilled Nursing Facility Barriers to Discharge: SNF Pending bed offer, SNF Pending discharge orders, SNF Pending transportation, SNF Pending discharge summary, Continued Medical Work up  Expected Discharge Plan and Services In-house Referral: Clinical Social Work   Post Acute Care Choice: Skilled Nursing Facility Living arrangements for the past 2 months: Independent Living Facility ([facility])                                       Social Determinants of Health (SDOH) Interventions SDOH Screenings   Food Insecurity: No Food Insecurity (04/22/2023)  Housing: Low Risk  (04/22/2023)  Transportation Needs: No Transportation Needs (04/22/2023)  Utilities: Not At Risk (04/22/2023)  Alcohol Screen: Low Risk  (05/21/2022)  Depression (PHQ2-9): Low Risk  (05/21/2022)  Financial Resource Strain: Low Risk  (05/21/2022)  Physical Activity: Insufficiently Active (05/21/2022)  Social Connections: Moderately Isolated (05/21/2022)  Stress: No Stress Concern Present (05/21/2022)  Tobacco Use: Medium Risk (04/27/2023)    Readmission Risk Interventions    04/26/2023    9:51 AM 01/14/2023   11:16 AM 05/04/2022    3:37 PM  Readmission Risk Prevention Plan  Transportation Screening Complete Complete Complete  PCP or Specialist Appt within 5-7 Days  Complete Complete  PCP or Specialist Appt within 3-5 Days Complete    Home Care Screening  Complete Complete  Medication Review (RN CM)  Complete Complete  HRI or Home Care Consult Complete     Palliative Care Screening --    Medication Review (RN Care Manager) Complete

## 2023-04-30 NOTE — Progress Notes (Addendum)
PROGRESS NOTE    Kathleen [name]  YQM:[id number] DOB: 1945-11-26 DOA: 04/22/2023 PCP: [name], MD    Brief Narrative:   Kathleen [name] is a 77 y.o. 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 Outpatient CT of abdomen and pelvis done as per Dr. Meridee Score which showed worsening diverticulitis with thickening of bowels, 1.2 cm localized perforation, 5.6 cm perirectal abscess; in which he recommended to proceed to the ED for further evaluation and management. On presentation, Tmax was 101.8, sodium was 129, WBC count 11.9. She was started on IV antibiotics.  General surgery, GI, cardiology, palliative care were consulted.  TRH consulted for admission for further evaluation and management of recurrent diverticulitis associated with perforation and abscess.  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  Has been treated with multiple rounds of IV followed by oral antibiotics recently with no significant improvement.  Patient with elevated temperature 101.8 F with WBC count 11.9.  Was started on empiric IV antibiotics.  General surgery was consulted and patient underwent exploratory laparotomy with sigmoid colectomy and end left  colostomy by Dr. Dwain Sarna on 04/27/2023. -- General Surgery following, appreciate assistance -- Wound VAC; surgery plans exchange today -- Abdominal drains with 31 mL and 43 mL out past 24 hours -- Not tolerating diet, abdominal x-ray ordered; may need NG tube replacement per general surgery today -- PICC line ordered, plan to start TPN today per general surgery -- Zosyn, plan 5-day postoperative course per [facility]CS  Hyponatremia: Resolved -- Na 129>>134>133>136 -- Furosemide currently on hold  -- BMP daily  Hypokalemia Repleted.  Potassium 4.4 this morning. -- Continue monitor electrolytes daily  Hypophosphatemia Phos was 1.9, will replete. -- Monitor electrolytes daily  Persistent atrial fibrillation on chronic anticoagulation -- Cardiology now signed off on 11/21 -- Continue Tikosyn 250 mg p.o. twice daily -- Holding home Xarelto for now, will defer to general surgery when okay to restart  Chronic hypoxic respiratory failure, oxygen dependent Hx COPD/BOOP At baseline on 4-5 L oxygen via nasal cannula. -- Mikael Spray neb daily -- Breo Ellipta 1 puff daily -- Albuterol neb every 2 hours.  Wheezing/shortness of breath  Chronic systolic congestive heart failure, compensated Patient did not tolerate Entresto and beta-blocker due to low blood pressures.  TTE April 2024 with LVEF 35-40%.  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 -- Cardiology following as above -- Holding home spironolactone, torsemide for now -- Strict I's and O's, daily weights  Type 2 diabetes mellitus Diabetic neuropathy --Gabapentin  GERD -- Protonix 40 mg IV every 24 hours  Morbid obesity, class I Body mass index is 34.14 kg/m.  Complicates all facets of care.  Moderate protein calorie malnutrition Nutrition Status: Nutrition Problem: Moderate Malnutrition Etiology: chronic illness Signs/Symptoms: mild fat depletion, mild muscle depletion, energy intake < 75% for > 7 days Interventions: Ensure  Enlive (each supplement provides 350kcal and 20 grams of protein), Magic  cup, MVI -- Not tolerating clear liquid diet, return to n.p.o. with plan to initiate TPN  Physical deconditioning -- PT/OT recommending SNF placement, TOC consulted  Goals of care Seen by palliative care, CODE STATUS now changed to DNR.  Overall prognosis remains guarded.    DVT prophylaxis: enoxaparin (LOVENOX) injection 40 mg Start: 04/28/23 0915 SCDs Start: 04/22/23 2228    Code Status: Limited: Do not attempt resuscitation (DNR) -DNR-LIMITED -Do Not Intubate/DNI  Family Communication: Updated daughter present at bedside this morning  Disposition Plan:  Level of care: Progressive Status is: Inpatient Remains inpatient appropriate because: IV antibiotics, not tolerating diet, now returns to n.p.o., starting TPN    Consultants:  General surgery Rew gastroenterology Palliative care Cardiology  Procedures:  Exploratory laparotomy with sigmoid colectomy and end left colostomy, Dr. Dwain Sarna; 04/27/2023  Antimicrobials:  Zosyn 11/14>> Perioperative cefotetan 11/19   Subjective: Patient seen examined bedside, sleeping, resting on her left side.  Daughter present.  RN present in room.  Complaining of buttock pain and right anterior thigh pain.  Seen by general surgery this morning, not tolerating clear liquid diet so now returned back to n.p.o. status.  PICC line ordered for initiation of TPN.   No specific questions, concerns or complaints at this time.  Denies headache, no dizziness, no chest pain, no palpitations, no shortness of breath, no fever/chills/night sweats, no diarrhea, no focal weakness, no fatigue, no paresthesias.  No acute events overnight per nursing staff.  Stable for transfer to telemetry floor.  Objective: Vitals:   04/30/23 0800 04/30/23 0900 04/30/23 1000 04/30/23 1123  BP: (!) 138/50 (!) 119/38 (!) 122/52   Pulse: 70 72 71   Resp: 20 18 18    Temp:    97.7 F (36.5 C)   TempSrc:    Oral  SpO2: 100% 99% 100%   Weight:      Height:        Intake/Output Summary (Last 24 hours) at 04/30/2023 1139 Last data filed at 04/30/2023 1000 Gross per 24 hour  Intake 344.47 ml  Output 857 ml  Net -512.53 ml   Filed Weights   04/27/23 1028 04/29/23 0500 04/30/23 0500  Weight: 79.4 kg 79.3 kg 79.3 kg    Examination:  Physical Exam: GEN: NAD, alert and oriented x 3, obese, ill in appearance HEENT: NCAT, PERRL, EOMI, sclera clear, dry mucous membranes PULM: Breath sounds slightly diminished bilateral bases, no wheezing/crackles, normal respiratory effort without accessory muscle use, on 4 L nasal cannula which is her baseline CV: RRR w/o M/G/R, right IJ central line noted in place GI: abd soft, mild TTP surrounding surgical incision site with wound VAC in place, ostomy noted with no air or stool in collection bag. MSK: + lower extremity peripheral edema, muscle strength globally intact 5/5 bilateral upper/lower extremities NEURO: CN II-XII intact, no focal deficits, sensation to light touch intact PSYCH: normal mood/affect Integumentary: Abdominal surgical incision site/ostomy noted as above, otherwise no other concerning rashes/lesions/wounds noted on exposed skin surfaces     Data Reviewed: I have personally reviewed following labs and imaging studies  CBC: Recent Labs  Lab 04/24/23 0441 04/25/23 0618 04/26/23 0859 04/27/23 1332 04/28/23 0442 04/29/23 0448 04/30/23 0639  WBC 9.2 7.4 8.1  --  18.7* 19.3* 18.9*  NEUTROABS 7.3 5.9 5.9  --  16.9*  --   --   HGB 10.3* 9.4* 9.6* 13.3 9.3* 9.1* 8.5*  HCT 32.1* 28.8* 31.2* 39.0 29.4* 28.6* 27.6*  MCV 81.7 82.5 82.5  --  81.9 82.9 83.6  PLT 240 235 269  --  333 392 374   Basic Metabolic Panel: Recent Labs  Lab 04/26/23 1751 04/27/23 0750 04/27/23 1332 04/28/23 0442 04/29/23 0448 04/29/23 1712 04/30/23 0639  NA 131* 132* 133* 134* 133* 135 136  K 3.8 3.9 3.9 4.0 3.5 3.7 4.4  CL 91* 97*  --   100 98 97* 100  CO2 28 27  --  25 26 26 26   GLUCOSE 138* 128*  --  206* 157* 135* 143*  BUN 7* 6*  --  6* 17 21 15   CREATININE 0.86 0.74  --  0.83 0.97 0.88 0.83  CALCIUM 9.2 8.4*  --  8.2* 7.9* 7.7* 7.7*  MG 2.7* 2.2  --  2.2 2.4  --  2.4  PHOS  --   --   --   --   --   --  1.9*   GFR: Estimated Creatinine Clearance: 53.7 mL/min (by C-G formula based on SCr of 0.83 mg/dL). Liver Function Tests: Recent Labs  Lab 04/24/23 0441  AST 19  ALT 20  ALKPHOS 52  BILITOT 0.8  PROT 6.2*  ALBUMIN 2.6*   No results for input(s): "LIPASE", "AMYLASE" in the last 168 hours.  No results for input(s): "AMMONIA" in the last 168 hours. Coagulation Profile: No results for input(s): "INR", "PROTIME" in the last 168 hours.  Cardiac Enzymes: No results for input(s): "CKTOTAL", "CKMB", "CKMBINDEX", "TROPONINI" in the last 168 hours. BNP (last 3 results) No results for input(s): "PROBNP" in the last 8760 hours. HbA1C: No results for input(s): "HGBA1C" in the last 72 hours. CBG: Recent Labs  Lab 04/29/23 1112 04/29/23 1507 04/29/23 2115 04/30/23 0822 04/30/23 1125  GLUCAP 179* 141* 171* 126* 107*   Lipid Profile: No results for input(s): "CHOL", "HDL", "LDLCALC", "TRIG", "CHOLHDL", "LDLDIRECT" in the last 72 hours. Thyroid Function Tests: No results for input(s): "TSH", "T4TOTAL", "FREET4", "T3FREE", "THYROIDAB" in the last 72 hours. Anemia Panel: No results for input(s): "VITAMINB12", "FOLATE", "FERRITIN", "TIBC", "IRON", "RETICCTPCT" in the last 72 hours. Sepsis Labs: No results for input(s): "PROCALCITON", "LATICACIDVEN" in the last 168 hours.   Recent Results (from the past 240 hour(s))  Resp panel by RT-PCR (RSV, Flu A&B, Covid) Anterior Nasal Swab     Status: None   Collection Time: 04/22/23  6:38 PM   Specimen: Anterior Nasal Swab  Result Value Ref Range Status   SARS Coronavirus 2 by RT PCR NEGATIVE NEGATIVE Final   Influenza A by PCR NEGATIVE NEGATIVE Final   Influenza B  by PCR NEGATIVE NEGATIVE Final    Comment: (NOTE) The Xpert Xpress SARS-CoV-2/FLU/RSV plus assay is intended as an aid in the diagnosis of influenza from Nasopharyngeal swab specimens and should not be used as a sole basis for treatment. Nasal washings and aspirates are unacceptable for Xpert Xpress SARS-CoV-2/FLU/RSV testing.  Fact Sheet for Patients: BloggerCourse.com  Fact Sheet for Healthcare Providers: SeriousBroker.it  This test is not yet approved or cleared by the [place] FDA and has been authorized for detection and/or diagnosis of SARS-CoV-2 by FDA under an Emergency Use Authorization (EUA). This EUA will remain in effect (meaning this test can be used) for the duration of the COVID-19 declaration under Section 564(b)(1) of the Act, 21 U.S.C. section 360bbb-3(b)(1), unless the authorization is terminated or revoked.     Resp Syncytial Virus by PCR NEGATIVE NEGATIVE Final    Comment: (NOTE) Fact Sheet for Patients: BloggerCourse.com  Fact Sheet for Healthcare Providers: SeriousBroker.it  This test is not yet approved or cleared by the [place] and has been authorized for detection and/or diagnosis of SARS-CoV-2 by FDA under an Emergency Use Authorization (EUA). This EUA will remain in effect (meaning this test can be used) for the duration of the COVID-19 declaration under Section 564(b)(1) of the Act, 21 U.S.C. section 360bbb-3(b)(1), unless the authorization is terminated or revoked.  Performed at [facility], [address], [place], [place] [address]   Blood Culture (routine x 2)     Status: None   Collection Time: 04/22/23  6:40 PM   Specimen: BLOOD RIGHT ARM  Result Value Ref Range Status   Specimen Description BLOOD RIGHT ARM  Final   Special Requests   Final    BOTTLES DRAWN AEROBIC AND ANAEROBIC Blood Culture adequate volume    Culture   Final    NO GROWTH 5 DAYS Performed at [facility], [address] [place], [place] [address]    Report Status 04/27/2023 FINAL  Final  Blood Culture (routine x 2)     Status: None   Collection Time: 04/22/23  7:08 PM   Specimen: BLOOD LEFT ARM  Result Value Ref Range Status   Specimen Description BLOOD LEFT ARM  Final   Special Requests   Final    BOTTLES DRAWN AEROBIC AND ANAEROBIC Blood Culture adequate volume   Culture   Final    NO GROWTH 5 DAYS Performed at [facility], [address], [place], [place] [address]    Report Status 04/27/2023 FINAL  Final  Surgical PCR screen     Status: None   Collection Time: 04/27/23 12:21 AM   Specimen: Nasal Mucosa; Nasal Swab  Result Value Ref Range Status   MRSA, PCR NEGATIVE NEGATIVE Final   Staphylococcus aureus NEGATIVE NEGATIVE Final    Comment: (NOTE) The Xpert SA Assay (FDA approved for NASAL specimens in patients 71 years of age and older), is one component of a comprehensive surveillance program. It is not intended to diagnose infection nor to guide or monitor treatment. Performed at [facility], [address] [place], [place] [address]          Radiology Studies: [place] EKG SITE RITE  Result Date: 04/30/2023 If [facility] image not attached, placement could not be confirmed due to current cardiac rhythm.       Scheduled Meds:  Chlorhexidine Gluconate Cloth  6 each Topical Daily   dofetilide  250 mcg Oral Q12H   enoxaparin (LOVENOX) injection  40 mg Subcutaneous Q24H   fluticasone furoate-vilanterol  1 puff Inhalation Daily   insulin aspart  0-15 Units Subcutaneous Q4H   methocarbamol (ROBAXIN) injection  500 mg Intravenous Q8H   mouth rinse  15 mL Mouth Rinse 4 times per day   pantoprazole (PROTONIX) IV  40 mg Intravenous Q24H   revefenacin  175 mcg Nebulization Daily   Continuous Infusions:  TPN (CLINIMIX-E) Adult     And   fat emul(SMOFlipid)     piperacillin-tazobactam  (ZOSYN)  IV Stopped (04/30/23 0926)   promethazine (PHENERGAN) injection (IM or IVPB)     sodium phosphate 30 mmol in sodium chloride 0.9 % 250 mL infusion 43 mL/hr at 04/30/23 1000     LOS: 8 days    Time spent: 52 minutes spent on chart review, discussion with nursing staff, consultants, updating family and interview/physical exam; more than 50% of that time was spent in counseling and/or coordination  of care.    Alvira Philips [place], DO [facility] Available via Epic secure chat 7am-7pm After these hours, please refer to coverage provider listed on [email or web address] 04/30/2023, 11:39 AM

## 2023-04-30 NOTE — Consult Note (Addendum)
WOC Nurse follow-up consult Note: Surgical PA at the bedside to assess the wound appearance during the first post-op dressing change.  Pt was medicated for pain prior to the procedure but was still in significant pain.  Full thickness midline post-op wound is beefy red, mod amt pink drainage in the cannister, 20X5X4cm Applied one piece black foam to cont suction.  WOC team will plan to change dressing again on Monday.   WOC Nurse ostomy follow up Colostomy pouch is in close proximity to the Vac dressing and both will need to be changed each time. Pt is in ICU and in pain and is not ready to learn at this time.  Daughter at the bedside, however she lives in [place] and will not be assisting with patient after discharge.  Stoma type/location: Stoma is red and viable, slightly above skin level, 1 1/2 inches.  No stool or flatus, small amt pink drainage in the pouch.  Applied barrier ring and 2 piece convex pouching system.  4 sets of each supply left at the bedside: Use Supplies: Barrier ring [phone] Pouch #649 Barrier [phone] Enrolled patient in [place] Secure Start Discharge program: NOT YET Thank-you,  Cammie Mcgee MSN, RN, CWOCN, [place], CNS [phone]

## 2023-05-01 ENCOUNTER — Inpatient Hospital Stay (HOSPITAL_COMMUNITY): Payer: Medicare Other

## 2023-05-01 DIAGNOSIS — J8489 Other specified interstitial pulmonary diseases: Secondary | ICD-10-CM | POA: Diagnosis not present

## 2023-05-01 DIAGNOSIS — K5720 Diverticulitis of large intestine with perforation and abscess without bleeding: Secondary | ICD-10-CM | POA: Diagnosis not present

## 2023-05-01 DIAGNOSIS — E039 Hypothyroidism, unspecified: Secondary | ICD-10-CM

## 2023-05-01 DIAGNOSIS — Z950 Presence of cardiac pacemaker: Secondary | ICD-10-CM | POA: Diagnosis not present

## 2023-05-01 LAB — GLUCOSE, CAPILLARY
Glucose-Capillary: 194 mg/dL — ABNORMAL HIGH (ref 70–99)
Glucose-Capillary: 198 mg/dL — ABNORMAL HIGH (ref 70–99)
Glucose-Capillary: 198 mg/dL — ABNORMAL HIGH (ref 70–99)
Glucose-Capillary: 168 mg/dL — ABNORMAL HIGH (ref 70–99)
Glucose-Capillary: 154 mg/dL — ABNORMAL HIGH (ref 70–99)

## 2023-05-01 LAB — CBC
HCT: 29.7 % — ABNORMAL LOW (ref 36.0–46.0)
Hemoglobin: 8.9 g/dL — ABNORMAL LOW (ref 12.0–15.0)
MCH: 25.3 pg — ABNORMAL LOW (ref 26.0–34.0)
MCHC: 30 g/dL (ref 30.0–36.0)
MCV: 84.4 fL (ref 80.0–100.0)
Platelets: 444 10*3/uL — ABNORMAL HIGH (ref 150–400)
RBC: 3.52 MIL/uL — ABNORMAL LOW (ref 3.87–5.11)
RDW: 15.5 % (ref 11.5–15.5)
WBC: 22.3 10*3/uL — ABNORMAL HIGH (ref 4.0–10.5)
nRBC: 0.3 % — ABNORMAL HIGH (ref 0.0–0.2)

## 2023-05-01 LAB — COMPREHENSIVE METABOLIC PANEL
ALT: 13 U/L (ref 0–44)
AST: 10 U/L — ABNORMAL LOW (ref 15–41)
Albumin: 2 g/dL — ABNORMAL LOW (ref 3.5–5.0)
Alkaline Phosphatase: 54 U/L (ref 38–126)
Anion gap: 10 (ref 5–15)
BUN: 16 mg/dL (ref 8–23)
CO2: 26 mmol/L (ref 22–32)
Calcium: 7.9 mg/dL — ABNORMAL LOW (ref 8.9–10.3)
Chloride: 100 mmol/L (ref 98–111)
Creatinine, Ser: 0.62 mg/dL (ref 0.44–1.00)
GFR, Estimated: >60 mL/min (ref >60)
Glucose, Bld: 195 mg/dL — ABNORMAL HIGH (ref 70–99)
Potassium: 3.4 mmol/L — ABNORMAL LOW (ref 3.5–5.1)
Sodium: 136 mmol/L (ref 135–145)
Total Bilirubin: 0.4 mg/dL (ref <1.2)
Total Protein: 5.1 g/dL — ABNORMAL LOW (ref 6.5–8.1)

## 2023-05-01 LAB — MAGNESIUM: Magnesium: 2.4 mg/dL (ref 1.7–2.4)

## 2023-05-01 LAB — HEPARIN LEVEL (UNFRACTIONATED): Heparin Unfractionated: 0.28 [IU]/mL — ABNORMAL LOW (ref 0.30–0.70)

## 2023-05-01 LAB — PHOSPHORUS: Phosphorus: 2.4 mg/dL — ABNORMAL LOW (ref 2.5–4.6)

## 2023-05-01 MED ORDER — FAT EMUL FISH OIL/PLANT BASED 20% (SMOFLIPID)IV EMUL
250.0000 mL | INTRAVENOUS | Status: AC
  Administered 2023-05-01: 250 mL via INTRAVENOUS
  Filled 2023-05-01: qty 250

## 2023-05-01 MED ORDER — POTASSIUM CHLORIDE 10 MEQ/100ML IV SOLN: 10.0000 meq | INTRAVENOUS | Status: DC

## 2023-05-01 MED ORDER — INFUVITE ADULT IV SOLN
INTRAVENOUS | Status: AC
  Filled 2023-05-01 (×2): qty 1000

## 2023-05-01 MED ORDER — POTASSIUM PHOSPHATES 15 MMOLE/5ML IV SOLN
15.0000 mmol | Freq: Once | INTRAVENOUS | Status: AC
  Administered 2023-05-01: 15 mmol via INTRAVENOUS
  Filled 2023-05-01: qty 5

## 2023-05-01 MED ORDER — HEPARIN (PORCINE) 25000 UT/250ML-% IV SOLN
1550.0000 [IU]/h | INTRAVENOUS | Status: DC
  Administered 2023-05-01: 950 [IU]/h via INTRAVENOUS
  Administered 2023-05-02: 1200 [IU]/h via INTRAVENOUS
  Administered 2023-05-03: 1350 [IU]/h via INTRAVENOUS
  Filled 2023-05-01 (×3): qty 250

## 2023-05-01 NOTE — Progress Notes (Signed)
PHARMACY - ANTICOAGULATION CONSULT NOTE  Pharmacy Consult for Heparin gtt Indication: atrial fibrillation  Allergies  Allergen Reactions   Allopurinol Other (See Comments)    Reaction:  Dizziness    Clindamycin Anaphylaxis and Hives   Flublok [Influenza Vaccine Recombinant] Other (See Comments)    Fever 103 with no alternative explanation day after vaccine.  Clydie Braun Highfill FNP-C   Pneumococcal 13-Val Conj Vacc Itching, Swelling and Rash   Dronedarone Rash   Montelukast Other (See Comments)    Makes her loopy.   Brovana [Arformoterol]     Caused muscle pain   Budesonide     Caused extreme joint pain   Entresto [Sacubitril-Valsartan] Other (See Comments)    hypotension   Fosamax [Alendronate Sodium] Nausea Only   Jardiance [Empagliflozin]     Caused a vaginal infection   Meperidine Nausea And Vomiting   Microplegia Msa-Msg [Plegisol]     Loopy,diarrhea   Rosuvastatin Other (See Comments)    Reaction:  Muscle spasms    Tetracycline Hives   Adhesive [Tape] Rash   Lovastatin Rash and Other (See Comments)    Muscle Pain    Patient Measurements: Height: 5' (152.4 cm) Weight: 79.3 kg (174 lb 13.2 oz) IBW/kg (Calculated) : 45.5 Heparin Dosing Weight: 63.6  Vital Signs: Temp: 97.7 F (36.5 C) (11/23 0745) Temp Source: Oral (11/23 0745) BP: 137/65 (11/23 0745) Pulse Rate: 80 (11/23 0745)  Labs: Recent Labs    04/29/23 0448 04/29/23 1712 04/30/23 0639 05/01/23 0440  HGB 9.1*  --  8.5* 8.9*  HCT 28.6*  --  27.6* 29.7*  PLT 392  --  374 444*  CREATININE 0.97 0.88 0.83 0.62   Estimated Creatinine Clearance: 55.7 mL/min (by C-G formula based on SCr of 0.62 mg/dL).  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    Concussion 10/04/2021   COPD (chronic  obstructive pulmonary disease) (HCC)    Diverticulitis    Gout    Hypertension    Longstanding persistent atrial fibrillation (HCC)    on Xarelto   Nonischemic cardiomyopathy (HCC)    Obesity    Pacemaker    Spontaneous pneumothorax 2013   Medications:  Infusions:   TPN (CLINIMIX-E) Adult     And   fat emul(SMOFlipid)     piperacillin-tazobactam (ZOSYN)  IV 3.375 g (05/01/23 0603)   potassium PHOSPHATE IVPB (in mmol) 15 mmol (05/01/23 0809)   promethazine (PHENERGAN) injection (IM or IVPB)     TPN (CLINIMIX-E) Adult 42 mL/hr at 04/30/23 1720   Assessment: [name] with history of atrial fibrillation on Xarelto prior to admission. Last dose of Xarelto 04/21/23 at 1900. Admitted for recurrent diverticulitis, perforation, and perirectal abscess. 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 Cleared by surgery to start hep gtt for afib.  Hgb 8.9, plts 44. Has been > 72 hours since last DOAC dose so no need to monitor aPTT.  Goal of Therapy:  Heparin level 0.3-0.7 units/ml Monitor platelets by anticoagulation protocol: Yes   Plan:  No bolus per surgery Start heparin infusion at 950 units/hr Continue to monitor H&H and platelets  [name], PharmD PGY1 Pharmacy Resident 05/01/2023 9:13 AM

## 2023-05-01 NOTE — Progress Notes (Signed)
Patient is alert, awake, has SOB with exertion . Patient got up on bedside commode voided 400 mls urine out put. JP drains with 120 mls.

## 2023-05-01 NOTE — Progress Notes (Signed)
PHARMACY - ANTICOAGULATION CONSULT NOTE  Pharmacy Consult for Heparin gtt Indication: atrial fibrillation  Allergies  Allergen Reactions   Allopurinol Other (See Comments)    Reaction:  Dizziness    Clindamycin Anaphylaxis and Hives   Flublok [Influenza Vaccine Recombinant] Other (See Comments)    Fever 103 with no alternative explanation day after vaccine.  Clydie Braun Highfill FNP-C   Pneumococcal 13-Val Conj Vacc Itching, Swelling and Rash   Dronedarone Rash   Montelukast Other (See Comments)    Makes her loopy.   Brovana [Arformoterol]     Caused muscle pain   Budesonide     Caused extreme joint pain   Entresto [Sacubitril-Valsartan] Other (See Comments)    hypotension   Fosamax [Alendronate Sodium] Nausea Only   Jardiance [Empagliflozin]     Caused a vaginal infection   Meperidine Nausea And Vomiting   Microplegia Msa-Msg [Plegisol]     Loopy,diarrhea   Rosuvastatin Other (See Comments)    Reaction:  Muscle spasms    Tetracycline Hives   Adhesive [Tape] Rash   Lovastatin Rash and Other (See Comments)    Muscle Pain    Patient Measurements: Height: 5' (152.4 cm) Weight: 79.3 kg (174 lb 13.2 oz) IBW/kg (Calculated) : 45.5 Heparin Dosing Weight: 64 kg  Vital Signs: Temp: 97.4 F (36.3 C) (11/23 1950) Temp Source: Oral (11/23 1950) BP: 138/63 (11/23 1950) Pulse Rate: 75 (11/23 1950)  Labs: Recent Labs    04/29/23 0448 04/29/23 1712 04/30/23 0639 05/01/23 0440 05/01/23 1950  HGB 9.1*  --  8.5* 8.9*  --   HCT 28.6*  --  27.6* 29.7*  --   PLT 392  --  374 444*  --   HEPARINUNFRC  --   --   --   --  0.28*  CREATININE 0.97 0.88 0.83 0.62  --    Estimated Creatinine Clearance: 55.7 mL/min (by C-G formula based on SCr of 0.62 mg/dL).  Medical History: Past Medical History:  Diagnosis Date   Asthma    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HCC)    CHF (congestive heart failure) (HCC)    Chronic renal insufficiency    Complete heart block  (HCC) s/p AV nodal ablation    Concussion 10/04/2021   COPD (chronic obstructive pulmonary disease) (HCC)    Diverticulitis    Gout    Hypertension    Longstanding persistent atrial fibrillation (HCC)    on Xarelto   Nonischemic cardiomyopathy (HCC)    Obesity    Pacemaker    Spontaneous pneumothorax 2013   Assessment: [name] with history of atrial fibrillation on Xarelto prior to admission. Last dose of Xarelto 04/21/23 at 1900. Admitted for recurrent diverticulitis, perforation, and perirectal abscess. 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 Cleared by surgery to start hep gtt for afib.  Hgb 8.9, plts 44. Has been > 72 hours since last DOAC dose so no need to monitor aPTT.  Goal of Therapy:  Heparin level 0.3-0.7 units/ml Monitor platelets by anticoagulation protocol: Yes   Plan:  No bolus per surgery Increase heparin infusion to 1050 units/hr Check heparin level in 8 hours and daily while on heparin Continue to monitor H&H and platelets   Thank you for allowing pharmacy to be a part of this patient's care.  Thelma Barge, PharmD Clinical Pharmacist

## 2023-05-01 NOTE — Progress Notes (Signed)
General surgery was paged to verify NG placement ,was told to advance 10cm more and do repeat xray.NG advanced by 10cm.Tried to reach general surgery oncall via page and secure chat to get a xray order but did not got a call back/reply to the message.

## 2023-05-01 NOTE — Plan of Care (Signed)
  Problem: Education: Goal: Ability to describe self-care measures that may prevent or decrease complications (Diabetes Survival Skills Education) will improve 05/01/2023 2302 by Tonna Boehringer, RN Outcome: Progressing 05/01/2023 2302 by Tonna Boehringer, RN Outcome: Progressing   Problem: Coping: Goal: Ability to adjust to condition or change in health will improve 05/01/2023 2302 by Tonna Boehringer, RN Outcome: Progressing 05/01/2023 2302 by Tonna Boehringer, RN Outcome: Progressing   Problem: Fluid Volume: Goal: Ability to maintain a balanced intake and output will improve Outcome: Progressing   Problem: Health Behavior/Discharge Planning: Goal: Ability to identify and utilize available resources and services will improve Outcome: Progressing Goal: Ability to manage health-related needs will improve Outcome: Progressing   Problem: Metabolic: Goal: Ability to maintain appropriate glucose levels will improve 05/01/2023 2302 by Tonna Boehringer, RN Outcome: Progressing 05/01/2023 2302 by Tonna Boehringer, RN Outcome: Progressing   Problem: Nutritional: Goal: Maintenance of adequate nutrition will improve Outcome: Progressing   Problem: Skin Integrity: Goal: Risk for impaired skin integrity will decrease 05/01/2023 2302 by Tonna Boehringer, RN Outcome: Progressing 05/01/2023 2302 by Tonna Boehringer, RN Outcome: Progressing   Problem: Education: Goal: Knowledge of General Education information will improve Description: Including pain rating scale, medication(s)/side effects and non-pharmacologic comfort measures 05/01/2023 2302 by Tonna Boehringer, RN Outcome: Progressing 05/01/2023 2302 by Tonna Boehringer, RN Outcome: Progressing   Problem: Clinical Measurements: Goal: Ability to maintain clinical measurements within normal limits will improve Outcome: Progressing Goal: Will remain free from infection 05/01/2023 2302 by Tonna Boehringer, RN Outcome: Progressing 05/01/2023 2302 by Tonna Boehringer, RN Outcome: Progressing Goal: Diagnostic test results will improve 05/01/2023 2302 by Tonna Boehringer, RN Outcome: Progressing 05/01/2023 2302 by Tonna Boehringer, RN Outcome: Progressing Goal: Cardiovascular complication will be avoided 05/01/2023 2302 by Tonna Boehringer, RN Outcome: Progressing 05/01/2023 2302 by Tonna Boehringer, RN Outcome: Progressing   Problem: Activity: Goal: Risk for activity intolerance will decrease 05/01/2023 2302 by Tonna Boehringer, RN Outcome: Progressing 05/01/2023 2302 by Tonna Boehringer, RN Outcome: Progressing   Problem: Nutrition: Goal: Adequate nutrition will be maintained 05/01/2023 2302 by Tonna Boehringer, RN Outcome: Progressing 05/01/2023 2302 by Tonna Boehringer, RN Outcome: Progressing   Problem: Coping: Goal: Level of anxiety will decrease 05/01/2023 2302 by Tonna Boehringer, RN Outcome: Progressing 05/01/2023 2302 by Tonna Boehringer, RN Outcome: Progressing   Problem: Elimination: Goal: Will not experience complications related to bowel motility Outcome: Progressing

## 2023-05-01 NOTE — Progress Notes (Signed)
PROGRESS NOTE        PATIENT DETAILS Name: Kathleen [name] Age: 77 y.o. Sex: female Date of Birth: [birth year] Admit Date: 04/22/2023 Admitting Physician Gery Pray, MD VQQ:[id number], Bernadene Bell, MD  Brief Summary: Patient is a 77 y.o.  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  Admitted by [facility]-started on empiric IV antibiotics-underwent preop assessment by cardiology-and then subsequently underwent  exploratory laparotomy with sigmoid colectomy and end ostomy on 11/19.  Postoperative course complicated by ileus-requiring NG tube decompression-and TNA for nutrition.  See below for further details.  Significant events: 11/14>> [facility] 11/19>>exploratory laparotomy with sigmoid colectomy and end ostomy   Significant studies: 11/14>> CT abdomen/pelvis: Worsening signal right diverticulitis-localized perforation-5.6 cm perirectal abscess, 6.0 cm left lower quadrant abscess. 11/22>> x-ray abdomen: Consistent with postoperative ileus.  Significant microbiology data: 11/14>> blood culture: No growth 11/14>> COVID/influenza/RSV PCR: Negative  Procedures: 11/19>>exploratory laparotomy with sigmoid colectomy and end ostomy on 11/19  Consults: Cardiology GI General surgery  Subjective: Feels nauseous-no vomiting.  Objective: Vitals: Blood pressure 137/65, pulse 80, temperature 97.7 F (36.5 C), temperature source Oral, resp. rate (!) 24, height 5' (1.524 m), weight 79.3 kg, SpO2 94%.   Exam: Gen Exam:Alert awake-not in any distress HEENT:atraumatic, normocephalic Chest: B/L clear to auscultation anteriorly CVS:S1S2 regular Abdomen: Soft-some distention-midline incision intact-no output in ostomy bag-2 drains in place. 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 Pulmonary tolerating with incentive spirometry/flutter valve Mobilization with PT/OT Continue bronchodilators  GERD PPI  DM-2 (A1c 6.2 on 11/15) CBGs stable on SSI  Recent Labs    04/30/23 2340 05/01/23 0359 05/01/23 0743  GLUCAP 173* 194* 198*   Nutrition Status: Nutrition Problem: Moderate Malnutrition Etiology: chronic illness  Signs/Symptoms: mild fat depletion,  mild muscle depletion, energy intake < 75% for > 7 days Interventions: Ensure Enlive (each supplement provides 350kcal and 20 grams of protein), Magic cup, MVI  Obesity: Estimated body mass index is 34.14 kg/m as calculated from the following:   Height as of this encounter: 5' (1.524 m).   Weight as of this encounter: 79.3 kg.   Code status:   Code Status: Limited: Do not attempt resuscitation (DNR) -DNR-LIMITED -Do Not Intubate/DNI    DVT Prophylaxis: IV heparin   Family Communication: None at bedside   Disposition Plan: Status is: Inpatient Remains inpatient appropriate because: Severity of illness   Planned Discharge Destination:Skilled nursing facility   Diet: Diet Order             Diet NPO time specified Except for: Ice Chips  Diet effective now                     Antimicrobial agents: Anti-infectives (From admission, onward)    Start     Dose/Rate Route Frequency Ordered Stop   04/27/23 1021  sodium chloride 0.9 % with cefoTEtan (CEFOTAN) ADS Med       Note to Pharmacy: Shanda Bumps M: cabinet override      04/27/23 1021 04/27/23 1253   04/26/23 0800  cefoTEtan (CEFOTAN) 2 g in sodium chloride 0.9 % 100 mL IVPB  Status:  Discontinued        2 g 200 mL/hr over 30 Minutes Intravenous To ShortStay Surgical 04/23/23 2111 04/27/23 0800   04/22/23 2300  piperacillin-tazobactam (ZOSYN) IVPB 3.375 g        3.375 g 12.5 mL/hr over 240 Minutes Intravenous Every 8 hours 04/22/23 2256 05/02/23 2359   04/22/23 1900  piperacillin-tazobactam (ZOSYN) IVPB 3.375 g        3.375 g 100 mL/hr over 30 Minutes Intravenous  Once 04/22/23 1852 04/22/23 1954        MEDICATIONS: Scheduled Meds:  Chlorhexidine Gluconate Cloth  6 each Topical Daily   dofetilide  250 mcg Oral Q12H   enoxaparin (LOVENOX) injection  40 mg Subcutaneous Q24H   fluticasone furoate-vilanterol  1 puff Inhalation Daily   insulin aspart  0-15 Units Subcutaneous Q4H   methocarbamol (ROBAXIN)  injection  500 mg Intravenous Q8H   mouth rinse  15 mL Mouth Rinse 4 times per day   pantoprazole (PROTONIX) IV  40 mg Intravenous Q24H   revefenacin  175 mcg Nebulization Daily   Continuous Infusions:  TPN (CLINIMIX-E) Adult     And   fat emul(SMOFlipid)     piperacillin-tazobactam (ZOSYN)  IV 3.375 g (05/01/23 0603)   potassium PHOSPHATE IVPB (in mmol) 15 mmol (05/01/23 0809)   promethazine (PHENERGAN) injection (IM or IVPB)     TPN (CLINIMIX-E) Adult 42 mL/hr at 04/30/23 1720   PRN Meds:.albuterol, guaiFENesin-dextromethorphan, HYDROmorphone (DILAUDID) injection, ondansetron (ZOFRAN) IV, promethazine (PHENERGAN) injection (IM or IVPB), sodium chloride flush   I have personally reviewed following labs and imaging studies  LABORATORY DATA: CBC: Recent Labs  Lab 04/25/23 0618 04/26/23 0859 04/27/23 1332 04/28/23 0442 04/29/23 0448 04/30/23 0639 05/01/23 0440  WBC 7.4 8.1  --  18.7* 19.3* 18.9* 22.3*  NEUTROABS 5.9 5.9  --  16.9*  --   --   --   HGB 9.4* 9.6* 13.3 9.3* 9.1* 8.5* 8.9*  HCT 28.8* 31.2* 39.0 29.4* 28.6* 27.6* 29.7*  MCV 82.5 82.5  --  81.9 82.9 83.6 84.4  PLT 235 269  --  333 392 374 444*    Basic Metabolic Panel: Recent Labs  Lab 04/27/23 0750 04/27/23 1332 04/28/23 0442 04/29/23 0448 04/29/23 1712 04/30/23 0639 05/01/23 0440  NA 132*   < > 134* 133* 135 136 136  K 3.9   < > 4.0 3.5 3.7 4.4 3.4*  CL 97*  --  100 98 97* 100 100  CO2 27  --  25 26 26 26 26   GLUCOSE 128*  --  206* 157* 135* 143* 195*  BUN 6*  --  6* 17 21 15 16   CREATININE 0.74  --  0.83 0.97 0.88 0.83 0.62  CALCIUM 8.4*  --  8.2* 7.9* 7.7* 7.7* 7.9*  MG 2.2  --  2.2 2.4  --  2.4 2.4  PHOS  --   --   --   --   --  1.9* 2.4*   < > = values in this interval not displayed.    GFR: Estimated Creatinine Clearance: 55.7 mL/min (by C-G formula based on SCr of 0.62 mg/dL).  Liver Function Tests: Recent Labs  Lab 05/01/23 0440  AST 10*  ALT 13  ALKPHOS 54  BILITOT 0.4  PROT  5.1*  ALBUMIN 2.0*   No results for input(s): "LIPASE", "AMYLASE" in the last 168 hours. No results for input(s): "AMMONIA" in the last 168 hours.  Coagulation Profile: No results for input(s): "INR", "PROTIME" in the last 168 hours.  Cardiac Enzymes: No results for input(s): "CKTOTAL", "CKMB", "CKMBINDEX", "TROPONINI" in the last 168 hours.  BNP (last 3 results) No results for input(s): "PROBNP" in the last 8760 hours.  Lipid Profile: No results for input(s): "CHOL", "HDL", "LDLCALC", "TRIG", "CHOLHDL", "LDLDIRECT" in the last 72 hours.  Thyroid Function Tests: No results for input(s): "TSH", "T4TOTAL", "FREET4", "T3FREE", "THYROIDAB" in the last 72 hours.  Anemia Panel: No results for input(s): "VITAMINB12", "FOLATE", "FERRITIN", "TIBC", "IRON", "RETICCTPCT" in the last 72 hours.  Urine analysis:    Component Value Date/Time   COLORURINE AMBER (A) 04/23/2023 0623   APPEARANCEUR CLOUDY (A) 04/23/2023 0623   APPEARANCEUR Hazy 12/25/2012 1653   LABSPEC 1.024 04/23/2023 0623   LABSPEC 1.021 12/25/2012 1653   PHURINE 5.0 04/23/2023 0623   GLUCOSEU NEGATIVE 04/23/2023 0623   GLUCOSEU NEGATIVE 03/17/2023 1003   HGBUR MODERATE (A) 04/23/2023 0623   BILIRUBINUR NEGATIVE 04/23/2023 0623   BILIRUBINUR Negative 12/25/2012 1653   KETONESUR NEGATIVE 04/23/2023 0623   PROTEINUR 30 (A) 04/23/2023 0623   UROBILINOGEN 0.2 03/17/2023 1003   NITRITE NEGATIVE 04/23/2023 0623   LEUKOCYTESUR LARGE (A) 04/23/2023 0623   LEUKOCYTESUR Negative 12/25/2012 1653    Sepsis Labs: Lactic Acid, Venous    Component Value Date/Time   LATICACIDVEN 0.8 04/23/2023 0114    MICROBIOLOGY: Recent Results (from the past 240 hour(s))  Resp panel by RT-PCR (RSV, Flu A&B, Covid) Anterior Nasal Swab     Status: None   Collection Time: 04/22/23  6:38 PM   Specimen: Anterior Nasal Swab  Result Value Ref Range Status   SARS Coronavirus 2 by RT PCR NEGATIVE NEGATIVE Final   Influenza A by PCR NEGATIVE  NEGATIVE Final   Influenza B by PCR NEGATIVE NEGATIVE Final    Comment: (NOTE) The Xpert Xpress SARS-CoV-2/FLU/RSV plus assay is intended as an aid in the diagnosis of influenza from Nasopharyngeal swab specimens and should not be used as a sole basis for treatment. Nasal washings and aspirates are unacceptable for Xpert Xpress SARS-CoV-2/FLU/RSV testing.  Fact Sheet for Patients: BloggerCourse.com  Fact  Sheet for Healthcare Providers: SeriousBroker.it  This test is not yet approved or cleared by the [place] and has been authorized for detection and/or diagnosis of SARS-CoV-2 by FDA under an Emergency Use Authorization (EUA). This EUA will remain in effect (meaning this test can be used) for the duration of the COVID-19 declaration under Section 564(b)(1) of the Act, 21 U.S.C. section 360bbb-3(b)(1), unless the authorization is terminated or revoked.     Resp Syncytial Virus by PCR NEGATIVE NEGATIVE Final    Comment: (NOTE) Fact Sheet for Patients: BloggerCourse.com  Fact Sheet for Healthcare Providers: SeriousBroker.it  This test is not yet approved or cleared by the [place] FDA and has been authorized for detection and/or diagnosis of SARS-CoV-2 by FDA under an Emergency Use Authorization (EUA). This EUA will remain in effect (meaning this test can be used) for the duration of the COVID-19 declaration under Section 564(b)(1) of the Act, 21 U.S.C. section 360bbb-3(b)(1), unless the authorization is terminated or revoked.  Performed at [facility], [address] [place], [place] [address]   Blood Culture (routine x 2)     Status: None   Collection Time: 04/22/23  6:40 PM   Specimen: BLOOD RIGHT ARM  Result Value Ref Range Status   Specimen Description BLOOD RIGHT ARM  Final   Special Requests   Final    BOTTLES DRAWN AEROBIC AND ANAEROBIC Blood  Culture adequate volume   Culture   Final    NO GROWTH 5 DAYS Performed at [facility], [address] [place], [place] [address]    Report Status 04/27/2023 FINAL  Final  Blood Culture (routine x 2)     Status: None   Collection Time: 04/22/23  7:08 PM   Specimen: BLOOD LEFT ARM  Result Value Ref Range Status   Specimen Description BLOOD LEFT ARM  Final   Special Requests   Final    BOTTLES DRAWN AEROBIC AND ANAEROBIC Blood Culture adequate volume   Culture   Final    NO GROWTH 5 DAYS Performed at [facility], [address], [place], [place] [address]    Report Status 04/27/2023 FINAL  Final  Surgical PCR screen     Status: None   Collection Time: 04/27/23 12:21 AM   Specimen: Nasal Mucosa; Nasal Swab  Result Value Ref Range Status   MRSA, PCR NEGATIVE NEGATIVE Final   Staphylococcus aureus NEGATIVE NEGATIVE Final    Comment: (NOTE) The Xpert SA Assay (FDA approved for NASAL specimens in patients 49 years of age and older), is one component of a comprehensive surveillance program. It is not intended to diagnose infection nor to guide or monitor treatment. Performed at [facility], [address] [place], [place] [address]     RADIOLOGY STUDIES/RESULTS: DG Abd Portable 1V  Result Date: 05/01/2023 CLINICAL DATA:  77 year old female NG tube placement. Postoperative day 4 status post exploratory laparotomy with sigmoid colectomy, colostomy. EXAM: PORTABLE ABDOMEN - 1 VIEW COMPARISON:  Abdomen radiographs yesterday and earlier. FINDINGS: Portable AP supine view at 0741 hours. Enteric tube terminates in the stomach, side hole the level of the gastric body. Ongoing dilated small bowel loops containing gas, up to 45 mm diameter. No significant change from yesterday. Partially visible pelvic drain. Stable cholecystectomy clips. IMPRESSION: 1. Enteric tube terminates in the stomach. 2. Persistent small bowel obstruction or ileus, no significant change from  yesterday. 3. Pelvic drain(s). Electronically Signed   By: Odessa Fleming  M.D.   On: 05/01/2023 08:18   DG Abd Portable 1V  Result Date: 04/30/2023 CLINICAL DATA:  Nasogastric tube placement. EXAM: PORTABLE ABDOMEN - 1 VIEW COMPARISON:  Radiograph earlier today FINDINGS: Tip of the enteric tube below the diaphragm in the stomach, the side port is in the region of the gastroesophageal junction. Gaseous small bowel distension centrally is similar to earlier today. IMPRESSION: Tip of the enteric tube below the diaphragm in the stomach, side port in the region of the gastroesophageal junction. Recommend advancement. Electronically Signed   By: Narda Rutherford M.D.   On: 04/30/2023 20:55   DG Abd Portable 1V  Result Date: 04/30/2023 CLINICAL DATA:  Postsurgical ileus following laparotomy and sigmoid colon resection on 04/27/2023. EXAM: PORTABLE ABDOMEN - 1 VIEW COMPARISON:  Abdomen and pelvis CT dated 04/22/2023. FINDINGS: Multiple moderately dilated loops of small bowel. Possible rectal tube in place with a partially collapsed balloon. Cholecystectomy clips. Lower thoracic and mild lumbar spine degenerative changes. Left lower quadrant ostomy. Linear and patchy consolidation at the left lung base. Minimal patchy density at the right lung base. IMPRESSION: 1. Multiple moderately dilated loops of small bowel, most consistent with postoperative ileus. 2. Left greater than right bibasilar atelectasis or pneumonia. Electronically Signed   By: Beckie Salts M.D.   On: 04/30/2023 11:50   [place] EKG SITE RITE  Result Date: 04/30/2023 If Site Rite image not attached, placement could not be confirmed due to current cardiac rhythm.    LOS: 9 days   Jeoffrey Massed, MD  [facility]    To contact the attending provider between 7A-7P or the covering provider during after hours 7P-7A, please log into the web site [email or web address] and access using universal [place] password for that web site. If you do not have the  password, please call the hospital operator.  05/01/2023, 9:21 AM

## 2023-05-01 NOTE — Progress Notes (Signed)
4 Days Post-Op   Subjective/Chief Complaint: Distention and nausea; NG was placed but has not been put to suction as of me round.   Objective: Vital signs in last 24 hours: Temp:  [97.5 F (36.4 C)-98.2 F (36.8 C)] 97.7 F (36.5 C) (11/23 0745) Pulse Rate:  [70-80] 80 (11/23 0745) Resp:  [15-25] 24 (11/23 0745) BP: (105-145)/(38-69) 137/65 (11/23 0745) SpO2:  [93 %-100 %] 94 % (11/23 0758) Last BM Date : 04/26/23  Intake/Output from previous day: 11/22 0701 - 11/23 0700 In: 65.8 [IV Piggyback:65.8] Out: 1040 [Urine:825; Drains:215] Intake/Output this shift: No intake/output data recorded.  Ab moderately distended, minimal tenderness; drains serosang, ostomy pink with sweat in appliance  Lab Results:  Recent Labs    04/30/23 0639 05/01/23 0440  WBC 18.9* 22.3*  HGB 8.5* 8.9*  HCT 27.6* 29.7*  PLT 374 444*   BMET Recent Labs    04/30/23 0639 05/01/23 0440  NA 136 136  K 4.4 3.4*  CL 100 100  CO2 26 26  GLUCOSE 143* 195*  BUN 15 16  CREATININE 0.83 0.62  CALCIUM 7.7* 7.9*   PT/INR No results for input(s): "LABPROT", "INR" in the last 72 hours. ABG No results for input(s): "PHART", "HCO3" in the last 72 hours.  Invalid input(s): "PCO2", "PO2"   Studies/Results: DG Abd Portable 1V  Result Date: 04/30/2023 CLINICAL DATA:  Nasogastric tube placement. EXAM: PORTABLE ABDOMEN - 1 VIEW COMPARISON:  Radiograph earlier today FINDINGS: Tip of the enteric tube below the diaphragm in the stomach, the side port is in the region of the gastroesophageal junction. Gaseous small bowel distension centrally is similar to earlier today. IMPRESSION: Tip of the enteric tube below the diaphragm in the stomach, side port in the region of the gastroesophageal junction. Recommend advancement. Electronically Signed   By: Narda Rutherford M.D.   On: 04/30/2023 20:55   DG Abd Portable 1V  Result Date: 04/30/2023 CLINICAL DATA:  Postsurgical ileus following laparotomy and sigmoid  colon resection on 04/27/2023. EXAM: PORTABLE ABDOMEN - 1 VIEW COMPARISON:  Abdomen and pelvis CT dated 04/22/2023. FINDINGS: Multiple moderately dilated loops of small bowel. Possible rectal tube in place with a partially collapsed balloon. Cholecystectomy clips. Lower thoracic and mild lumbar spine degenerative changes. Left lower quadrant ostomy. Linear and patchy consolidation at the left lung base. Minimal patchy density at the right lung base. IMPRESSION: 1. Multiple moderately dilated loops of small bowel, most consistent with postoperative ileus. 2. Left greater than right bibasilar atelectasis or pneumonia. Electronically Signed   By: Beckie Salts M.D.   On: 04/30/2023 11:50   [place] EKG SITE RITE  Result Date: 04/30/2023 If Site Rite image not attached, placement could not be confirmed due to current cardiac rhythm.   Anti-infectives: Anti-infectives (From admission, onward)    Start     Dose/Rate Route Frequency Ordered Stop   04/27/23 1021  sodium chloride 0.9 % with cefoTEtan (CEFOTAN) ADS Med       Note to Pharmacy: Shanda Bumps M: cabinet override      04/27/23 1021 04/27/23 1253   04/26/23 0800  cefoTEtan (CEFOTAN) 2 g in sodium chloride 0.9 % 100 mL IVPB  Status:  Discontinued        2 g 200 mL/hr over 30 Minutes Intravenous To ShortStay Surgical 04/23/23 2111 04/27/23 0800   04/22/23 2300  piperacillin-tazobactam (ZOSYN) IVPB 3.375 g        3.375 g 12.5 mL/hr over 240 Minutes Intravenous Every 8 hours 04/22/23  2256 05/02/23 2359   04/22/23 1900  piperacillin-tazobactam (ZOSYN) IVPB 3.375 g        3.375 g 100 mL/hr over 30 Minutes Intravenous  Once 04/22/23 1852 04/22/23 1954       Assessment/Plan: POD 4 elap, sigmoid colectomy, colostomy- MW -expect some drainage from vagina although this appears to have abated -Postop ileus, NG placed, have discussed with nursing and told them this needs to be to low intermittent wall suction.  -AF VSS; exam reassuring without  peritonitis; five days zosyn postop; trend WBC; if not trending down tomorrow, would plan CT A/P with oral (via NG) and IV contrast - Hgb stable - ok for heparin gtt for afib but would do this without boluses, just a rate -oob, pulm toilet -On PICC/TPN 11/22 --> -PPX: SCDs; ok for chemical dvt ppx vs heparin gtt as per primary  Stephanie Coup [facility] 05/01/2023

## 2023-05-01 NOTE — Progress Notes (Signed)
PHARMACY - TOTAL PARENTERAL NUTRITION CONSULT NOTE   Indication: Prolonged ileus  Patient Measurements: Height: 5' (152.4 cm) Weight: 79.3 kg (174 lb 13.2 oz) IBW/kg (Calculated) : 45.5 TPN AdjBW (KG): 54 Body mass index is 34.14 kg/m.   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 Patient has been NPO since surgery.   Glucose / Insulin: CBGs <180. 13u SSI  Electrolytes: K 3.4, Phos 2.4  s/p NaPhos x1, CoCa 8.8. Others within normal limits.  Renal: SCr 0.62  Hepatic: LFTs/Tbili on 11/16 within normal limits.  Intake / Output; MIVF: UOP 0.3 ml/kg/hr. R-RLQ JP drain 30 mL; R-RUQ JP drain 185 mL. LBM 11/18. Colostomy output - 0 mL GI Imaging: 11/21 KUB moderately dilated loops of small bowel, c/w post-operative ileus GI Surgeries / Procedures:  11/19 ex lap with sigmoid colectomy and end left colostomy  Central access: PICC placed 04/30/23 TPN start date: 04/30/23  Nutritional Goals: Clinimix E 8/10 1L at 42 ml/hr with SMOF lipids + D10 at 42 ml/hr on MTWThFS Clinimix 8/10 with additive lytes 2L at 83 ml/hr on Sun - Average of 1551 kcals per day (97% of goal) and 91g protein per day (100% goal)  Electrolytes in 8/10 E 1000 mL: Na 35 mEq, K 30 mEq, Mag 5 mEq, Ca 5 mEq, Phos , Acetate 83, Cl 76 mEq Electrolytes in Clinimix E 2L bag: Na , K , Mag , Ca , Phos , Ac , Cl .   RD Assessment: Estimated Needs Total Energy Estimated Needs: 1600-1800 kcal Total Protein Estimated Needs: 75-95 gm Total Fluid Estimated Needs: >1.6L  Current Nutrition:  NPO except for ice chips  Plan:  Continue Clinimix E 8/10 1L at 42 ml/hr with SMOF lipids 20.8 ml/hr x12 hours today - meeting ~72% of needs.  Electrolytes in  8/10 E 1000 mL: Na 35 mEq, K 30 mEq, Mag 5 mEq, Ca 5 mEq, Phos , Acetate 83, Cl 76 mEq  Continue standard MVI and trace elements to TPN Continue Moderate q4h SSI and adjust as needed  Monitor TPN labs daily on Clinimix.  Kphos 15 mmol IV x1 today (d/c K runs)  Calton Dach, PharmD, [facility] Clinical Pharmacist 05/01/2023 7:16 AM

## 2023-05-02 DIAGNOSIS — E440 Moderate protein-calorie malnutrition: Secondary | ICD-10-CM

## 2023-05-02 DIAGNOSIS — K5720 Diverticulitis of large intestine with perforation and abscess without bleeding: Secondary | ICD-10-CM | POA: Diagnosis not present

## 2023-05-02 DIAGNOSIS — E1140 Type 2 diabetes mellitus with diabetic neuropathy, unspecified: Secondary | ICD-10-CM | POA: Diagnosis not present

## 2023-05-02 DIAGNOSIS — E039 Hypothyroidism, unspecified: Secondary | ICD-10-CM | POA: Diagnosis not present

## 2023-05-02 LAB — COMPREHENSIVE METABOLIC PANEL
ALT: 11 U/L (ref 0–44)
AST: 12 U/L — ABNORMAL LOW (ref 15–41)
Albumin: 2.1 g/dL — ABNORMAL LOW (ref 3.5–5.0)
Alkaline Phosphatase: 47 U/L (ref 38–126)
Anion gap: 10 (ref 5–15)
BUN: 18 mg/dL (ref 8–23)
CO2: 25 mmol/L (ref 22–32)
Calcium: 8 mg/dL — ABNORMAL LOW (ref 8.9–10.3)
Chloride: 101 mmol/L (ref 98–111)
Creatinine, Ser: 0.64 mg/dL (ref 0.44–1.00)
GFR, Estimated: >60 mL/min (ref >60)
Glucose, Bld: 171 mg/dL — ABNORMAL HIGH (ref 70–99)
Potassium: 3.3 mmol/L — ABNORMAL LOW (ref 3.5–5.1)
Sodium: 136 mmol/L (ref 135–145)
Total Bilirubin: 0.4 mg/dL (ref <1.2)
Total Protein: 5.2 g/dL — ABNORMAL LOW (ref 6.5–8.1)

## 2023-05-02 LAB — CBC
HCT: 29.8 % — ABNORMAL LOW (ref 36.0–46.0)
Hemoglobin: 8.9 g/dL — ABNORMAL LOW (ref 12.0–15.0)
MCH: 25.6 pg — ABNORMAL LOW (ref 26.0–34.0)
MCHC: 29.9 g/dL — ABNORMAL LOW (ref 30.0–36.0)
MCV: 85.9 fL (ref 80.0–100.0)
Platelets: 412 10*3/uL — ABNORMAL HIGH (ref 150–400)
RBC: 3.47 MIL/uL — ABNORMAL LOW (ref 3.87–5.11)
RDW: 15.4 % (ref 11.5–15.5)
WBC: 18.1 10*3/uL — ABNORMAL HIGH (ref 4.0–10.5)
nRBC: 0.6 % — ABNORMAL HIGH (ref 0.0–0.2)

## 2023-05-02 LAB — HEPARIN LEVEL (UNFRACTIONATED)
Heparin Unfractionated: 0.2 [IU]/mL — ABNORMAL LOW (ref 0.30–0.70)
Heparin Unfractionated: 0.13 [IU]/mL — ABNORMAL LOW (ref 0.30–0.70)
Heparin Unfractionated: 0.22 [IU]/mL — ABNORMAL LOW (ref 0.30–0.70)

## 2023-05-02 LAB — GLUCOSE, CAPILLARY
Glucose-Capillary: 132 mg/dL — ABNORMAL HIGH (ref 70–99)
Glucose-Capillary: 143 mg/dL — ABNORMAL HIGH (ref 70–99)
Glucose-Capillary: 146 mg/dL — ABNORMAL HIGH (ref 70–99)
Glucose-Capillary: 147 mg/dL — ABNORMAL HIGH (ref 70–99)
Glucose-Capillary: 150 mg/dL — ABNORMAL HIGH (ref 70–99)
Glucose-Capillary: 172 mg/dL — ABNORMAL HIGH (ref 70–99)
Glucose-Capillary: 175 mg/dL — ABNORMAL HIGH (ref 70–99)

## 2023-05-02 LAB — APTT: aPTT: 43 s — ABNORMAL HIGH (ref 24–36)

## 2023-05-02 LAB — MAGNESIUM: Magnesium: 2.4 mg/dL (ref 1.7–2.4)

## 2023-05-02 LAB — PHOSPHORUS: Phosphorus: 2.1 mg/dL — ABNORMAL LOW (ref 2.5–4.6)

## 2023-05-02 MED ORDER — POTASSIUM PHOSPHATES 15 MMOLE/5ML IV SOLN
30.0000 mmol | Freq: Once | INTRAVENOUS | Status: AC
  Administered 2023-05-02: 30 mmol via INTRAVENOUS
  Filled 2023-05-02: qty 10

## 2023-05-02 MED ORDER — FAT EMUL FISH OIL/PLANT BASED 20% (SMOFLIPID)IV EMUL
250.0000 mL | INTRAVENOUS | Status: AC
  Administered 2023-05-02: 250 mL via INTRAVENOUS
  Filled 2023-05-02: qty 250

## 2023-05-02 MED ORDER — TRACE MINERALS CU-MN-SE-ZN 300-55-60-3000 MCG/ML IV SOLN
INTRAVENOUS | Status: AC
  Filled 2023-05-02: qty 2000

## 2023-05-02 NOTE — Progress Notes (Signed)
5 Days Post-Op   Subjective/Chief Complaint: Distention and nausea stable to impvoed; NG working. Beginning to have some thin liquid type stool from stoma   Objective: Vital signs in last 24 hours: Temp:  [97.4 F (36.3 C)-98.6 F (37 C)] 97.5 F (36.4 C) (11/24 0750) Pulse Rate:  [72-76] 73 (11/24 0750) Resp:  [14-20] 18 (11/24 0750) BP: (122-138)/(53-82) 130/53 (11/24 0750) SpO2:  [94 %-99 %] 96 % (11/24 0853) Weight:  [87.2 kg] 87.2 kg (11/24 0500) Last BM Date : 04/26/23  Intake/Output from previous day: 11/23 0701 - 11/24 0700 In: 1519 [I.V.:1059.5; IV Piggyback:459.6] Out: 680 [Urine:150; Emesis/NG output:300; Drains:230] Intake/Output this shift: No intake/output data recorded.  Ab moderately distended, minimal tenderness; drains serosang, ostomy pink with gel consistency bilious stool, small vol, no flatus in appliance  Lab Results:  Recent Labs    05/01/23 0440 05/02/23 0359  WBC 22.3* 18.1*  HGB 8.9* 8.9*  HCT 29.7* 29.8*  PLT 444* 412*   BMET Recent Labs    05/01/23 0440 05/02/23 0359  NA 136 136  K 3.4* 3.3*  CL 100 101  CO2 26 25  GLUCOSE 195* 171*  BUN 16 18  CREATININE 0.62 0.64  CALCIUM 7.9* 8.0*   PT/INR No results for input(s): "LABPROT", "INR" in the last 72 hours. ABG No results for input(s): "PHART", "HCO3" in the last 72 hours.  Invalid input(s): "PCO2", "PO2"   Studies/Results: DG Abd Portable 1V  Result Date: 05/01/2023 CLINICAL DATA:  77 year old female NG tube placement. Postoperative day 4 status post exploratory laparotomy with sigmoid colectomy, colostomy. EXAM: PORTABLE ABDOMEN - 1 VIEW COMPARISON:  Abdomen radiographs yesterday and earlier. FINDINGS: Portable AP supine view at 0741 hours. Enteric tube terminates in the stomach, side hole the level of the gastric body. Ongoing dilated small bowel loops containing gas, up to 45 mm diameter. No significant change from yesterday. Partially visible pelvic drain. Stable  cholecystectomy clips. IMPRESSION: 1. Enteric tube terminates in the stomach. 2. Persistent small bowel obstruction or ileus, no significant change from yesterday. 3. Pelvic drain(s). Electronically Signed   By: Odessa Fleming M.D.   On: 05/01/2023 08:18   DG Abd Portable 1V  Result Date: 04/30/2023 CLINICAL DATA:  Nasogastric tube placement. EXAM: PORTABLE ABDOMEN - 1 VIEW COMPARISON:  Radiograph earlier today FINDINGS: Tip of the enteric tube below the diaphragm in the stomach, the side port is in the region of the gastroesophageal junction. Gaseous small bowel distension centrally is similar to earlier today. IMPRESSION: Tip of the enteric tube below the diaphragm in the stomach, side port in the region of the gastroesophageal junction. Recommend advancement. Electronically Signed   By: Narda Rutherford M.D.   On: 04/30/2023 20:55    Anti-infectives: Anti-infectives (From admission, onward)    Start     Dose/Rate Route Frequency Ordered Stop   04/27/23 1021  sodium chloride 0.9 % with cefoTEtan (CEFOTAN) ADS Med       Note to Pharmacy: Shanda Bumps M: cabinet override      04/27/23 1021 04/27/23 1253   04/26/23 0800  cefoTEtan (CEFOTAN) 2 g in sodium chloride 0.9 % 100 mL IVPB  Status:  Discontinued        2 g 200 mL/hr over 30 Minutes Intravenous To ShortStay Surgical 04/23/23 2111 04/27/23 0800   04/22/23 2300  piperacillin-tazobactam (ZOSYN) IVPB 3.375 g        3.375 g 12.5 mL/hr over 240 Minutes Intravenous Every 8 hours 04/22/23 2256 05/02/23 2359  04/22/23 1900  piperacillin-tazobactam (ZOSYN) IVPB 3.375 g        3.375 g 100 mL/hr over 30 Minutes Intravenous  Once 04/22/23 1852 04/22/23 1954       Assessment/Plan: POD 5 elap, sigmoid colectomy, colostomy- MW -expect some drainage from vagina although this appears to have abated -Postop ileus, NG to low intermittent wall suction.  -AF VSS; exam reassuring without peritonitis; trend WBC; trending down now; will likely plan CT A/P  with oral (via NG) and IV contrast POD#6 vs 7 to re-assess for evolving fluid collections that could contribute to reactive ileus. On IV Zosyn at present - Hgb on heparin gtt for afib but would do this without boluses, just a rate -oob, pulm toilet -On PICC/TPN 11/22 --> -PPX: SCDs; hep gtt  Stephanie Coup [facility] 05/02/2023

## 2023-05-02 NOTE — Progress Notes (Signed)
PROGRESS NOTE        PATIENT DETAILS Name: Kathleen [name] Age: 77 y.o. Sex: female Date of Birth: March 01, 1946 Admit Date: 04/22/2023 Admitting Physician Gery Pray, MD [id number], Bernadene Bell, MD  Brief Summary: Patient is a 77 y.o.  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  Admitted by [facility]-started on empiric IV antibiotics-underwent preop assessment by cardiology-and then subsequently underwent  exploratory laparotomy with sigmoid colectomy and end ostomy on 11/19.  Postoperative course complicated by ileus-requiring NG tube decompression-and TNA for nutrition.  See below for further details.  Significant events: 11/14>> admit to [facility] 11/19>>exploratory laparotomy with sigmoid colectomy and end ostomy   Significant studies: 11/14>> CT abdomen/pelvis: Worsening signal right diverticulitis-localized perforation-5.6 cm perirectal abscess, 6.0 cm left lower quadrant abscess. 11/22>> x-ray abdomen: Consistent with postoperative ileus.  Significant microbiology data: 11/14>> blood culture: No growth 11/14>> COVID/influenza/RSV PCR: Negative  Procedures: 11/19>>exploratory laparotomy with sigmoid colectomy and end ostomy on 11/19  Consults: Cardiology GI General surgery  Subjective: No vomiting-no ostomy output.  No other complaints at bedside.  Objective: Vitals: Blood pressure (!) 130/53, pulse 73, temperature (!) 97.5 F (36.4 C), temperature source Oral, resp. rate 18, height 5' (1.524 m), weight 87.2 kg, SpO2 96%.   Exam: Gen Exam:Alert awake-not in any distress HEENT:atraumatic, normocephalic Chest: B/L clear to auscultation anteriorly CVS:S1S2 regular Abdomen: Soft-mildly distended-no output in ostomy-drains remain in  place. 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   Hypokalemia/hypophosphatemia Will be repleted by pharmacy-on TNA Follow electrolytes  Normocytic anemia Due to critical illness-no evidence of blood loss Follow CBC  Persistent atrial fibrillation Telemetry monitoring Tikosyn IV heparin restarted 11/23-Hb stable Attempt to keep K> 4  History of AV nodal ablation-s/p PPM ([employer]) Telemetry monitoring  Chronic HFrEF (EF 35-40% on 4/9 by TTE) Volume status stable Aldactone on hold-resume when oral intake is stable  Chronic hypoxic respiratory failure on home O2 COPD BOOP Stable Pulmonary tolerating with incentive spirometry/flutter valve Mobilization with PT/OT Continue bronchodilators  GERD PPI  DM-2 (A1c 6.2 on 11/15) CBGs stable on SSI  Recent Labs    05/02/23 0002 05/02/23 0310 05/02/23 0747  GLUCAP 147* 150* 172*   Nutrition Status: Nutrition Problem: Moderate Malnutrition Etiology: chronic illness Signs/Symptoms: mild fat depletion, mild muscle depletion, energy intake <  75% for > 7 days Interventions: Ensure Enlive (each supplement provides 350kcal  and 20 grams of protein), Magic cup, MVI  Obesity: Estimated body mass index is 37.54 kg/m as calculated from the following:   Height as of this encounter: 5' (1.524 m).   Weight as of this encounter: 87.2 kg.   Code status:   Code Status: Limited: Do not attempt resuscitation (DNR) -DNR-LIMITED -Do Not Intubate/DNI    DVT Prophylaxis: IV heparin   Family Communication: None at bedside   Disposition Plan: Status is: Inpatient Remains inpatient appropriate because: Severity of illness   Planned Discharge Destination:Skilled nursing facility   Diet: Diet Order             Diet NPO time specified Except for: Ice Chips  Diet effective now                     Antimicrobial agents: Anti-infectives (From admission, onward)    Start     Dose/Rate Route Frequency Ordered Stop   04/27/23 1021  sodium chloride 0.9 % with cefoTEtan (CEFOTAN) ADS Med       Note to Pharmacy: Shanda Bumps M: cabinet override      04/27/23 1021 04/27/23 1253   04/26/23 0800  cefoTEtan (CEFOTAN) 2 g in sodium chloride 0.9 % 100 mL IVPB  Status:  Discontinued        2 g 200 mL/hr over 30 Minutes Intravenous To ShortStay Surgical 04/23/23 2111 04/27/23 0800   04/22/23 2300  piperacillin-tazobactam (ZOSYN) IVPB 3.375 g        3.375 g 12.5 mL/hr over 240 Minutes Intravenous Every 8 hours 04/22/23 2256 05/02/23 2359   04/22/23 1900  piperacillin-tazobactam (ZOSYN) IVPB 3.375 g        3.375 g 100 mL/hr over 30 Minutes Intravenous  Once 04/22/23 1852 04/22/23 1954        MEDICATIONS: Scheduled Meds:  Chlorhexidine Gluconate Cloth  6 each Topical Daily   dofetilide  250 mcg Oral Q12H   fluticasone furoate-vilanterol  1 puff Inhalation Daily   insulin aspart  0-15 Units Subcutaneous Q4H   methocarbamol (ROBAXIN) injection  500 mg Intravenous Q8H   mouth rinse  15 mL Mouth Rinse 4 times per day   pantoprazole (PROTONIX) IV  40 mg Intravenous Q24H   revefenacin  175 mcg Nebulization Daily    Continuous Infusions:  TPN (CLINIMIX) Adult with Electrolyte Additives     And   fat emul(SMOFlipid)     heparin 1,200 Units/hr (05/02/23 0504)   piperacillin-tazobactam (ZOSYN)  IV 3.375 g (05/02/23 0606)   potassium PHOSPHATE IVPB (in mmol) 30 mmol (05/02/23 0826)   promethazine (PHENERGAN) injection (IM or IVPB)     TPN (CLINIMIX-E) Adult 42 mL/hr at 05/01/23 1801   PRN Meds:.albuterol, guaiFENesin-dextromethorphan, HYDROmorphone (DILAUDID) injection, ondansetron (ZOFRAN) IV, promethazine (PHENERGAN) injection (IM or IVPB), sodium chloride flush   I have personally reviewed following labs and imaging studies  LABORATORY DATA: CBC: Recent Labs  Lab 04/26/23 0859 04/27/23 1332 04/28/23 0442 04/29/23 0448 04/30/23 0639 05/01/23 0440 05/02/23 0359  WBC 8.1  --  18.7* 19.3* 18.9* 22.3* 18.1*  NEUTROABS 5.9  --  16.9*  --   --   --   --   HGB 9.6*   < > 9.3* 9.1* 8.5* 8.9* 8.9*  HCT 31.2*   < > 29.4* 28.6* 27.6* 29.7* 29.8*  MCV 82.5  --  81.9 82.9 83.6 84.4 85.9  PLT 269  --  333  392 374 444* 412*   < > = values in this interval not displayed.    Basic Metabolic Panel: Recent Labs  Lab 04/28/23 0442 04/29/23 0448 04/29/23 1712 04/30/23 0639 05/01/23 0440 05/02/23 0359  NA 134* 133* 135 136 136 136  K 4.0 3.5 3.7 4.4 3.4* 3.3*  CL 100 98 97* 100 100 101  CO2 25 26 26 26 26 25   GLUCOSE 206* 157* 135* 143* 195* 171*  BUN 6* 17 21 15 16 18   CREATININE 0.83 0.97 0.88 0.83 0.62 0.64  CALCIUM 8.2* 7.9* 7.7* 7.7* 7.9* 8.0*  MG 2.2 2.4  --  2.4 2.4 2.4  PHOS  --   --   --  1.9* 2.4* 2.1*    GFR: Estimated Creatinine Clearance: 58.7 mL/min (by C-G formula based on SCr of 0.64 mg/dL).  Liver Function Tests: Recent Labs  Lab 05/01/23 0440 05/02/23 0359  AST 10* 12*  ALT 13 11  ALKPHOS 54 47  BILITOT 0.4 0.4  PROT 5.1* 5.2*  ALBUMIN 2.0* 2.1*   No results for input(s): "LIPASE", "AMYLASE" in the last 168 hours. No results for input(s): "AMMONIA" in the  last 168 hours.  Coagulation Profile: No results for input(s): "INR", "PROTIME" in the last 168 hours.  Cardiac Enzymes: No results for input(s): "CKTOTAL", "CKMB", "CKMBINDEX", "TROPONINI" in the last 168 hours.  BNP (last 3 results) No results for input(s): "PROBNP" in the last 8760 hours.  Lipid Profile: No results for input(s): "CHOL", "HDL", "LDLCALC", "TRIG", "CHOLHDL", "LDLDIRECT" in the last 72 hours.  Thyroid Function Tests: No results for input(s): "TSH", "T4TOTAL", "FREET4", "T3FREE", "THYROIDAB" in the last 72 hours.  Anemia Panel: No results for input(s): "VITAMINB12", "FOLATE", "FERRITIN", "TIBC", "IRON", "RETICCTPCT" in the last 72 hours.  Urine analysis:    Component Value Date/Time   COLORURINE AMBER (A) 04/23/2023 0623   APPEARANCEUR CLOUDY (A) 04/23/2023 0623   APPEARANCEUR Hazy 12/25/2012 1653   LABSPEC 1.024 04/23/2023 0623   LABSPEC 1.021 12/25/2012 1653   PHURINE 5.0 04/23/2023 0623   GLUCOSEU NEGATIVE 04/23/2023 0623   GLUCOSEU NEGATIVE 03/17/2023 1003   HGBUR MODERATE (A) 04/23/2023 0623   BILIRUBINUR NEGATIVE 04/23/2023 0623   BILIRUBINUR Negative 12/25/2012 1653   KETONESUR NEGATIVE 04/23/2023 0623   PROTEINUR 30 (A) 04/23/2023 0623   UROBILINOGEN 0.2 03/17/2023 1003   NITRITE NEGATIVE 04/23/2023 0623   LEUKOCYTESUR LARGE (A) 04/23/2023 0623   LEUKOCYTESUR Negative 12/25/2012 1653    Sepsis Labs: Lactic Acid, Venous    Component Value Date/Time   LATICACIDVEN 0.8 04/23/2023 0114    MICROBIOLOGY: Recent Results (from the past 240 hour(s))  Resp panel by RT-PCR (RSV, Flu A&B, Covid) Anterior Nasal Swab     Status: None   Collection Time: 04/22/23  6:38 PM   Specimen: Anterior Nasal Swab  Result Value Ref Range Status   SARS Coronavirus 2 by RT PCR NEGATIVE NEGATIVE Final   Influenza A by PCR NEGATIVE NEGATIVE Final   Influenza B by PCR NEGATIVE NEGATIVE Final    Comment: (NOTE) The Xpert Xpress SARS-CoV-2/FLU/RSV plus assay is  intended as an aid in the diagnosis of influenza from Nasopharyngeal swab specimens and should not be used as a sole basis for treatment. Nasal washings and aspirates are unacceptable for Xpert Xpress SARS-CoV-2/FLU/RSV testing.  Fact Sheet for Patients: BloggerCourse.com  Fact Sheet for Healthcare Providers: SeriousBroker.it  This test is not yet approved or cleared by the [place] FDA and has been authorized for detection and/or diagnosis of  SARS-CoV-2 by FDA under an Emergency Use Authorization (EUA). This EUA will remain in effect (meaning this test can be used) for the duration of the COVID-19 declaration under Section 564(b)(1) of the Act, 21 U.S.C. section 360bbb-3(b)(1), unless the authorization is terminated or revoked.     Resp Syncytial Virus by PCR NEGATIVE NEGATIVE Final    Comment: (NOTE) Fact Sheet for Patients: BloggerCourse.com  Fact Sheet for Healthcare Providers: SeriousBroker.it  This test is not yet approved or cleared by the [place] FDA and has been authorized for detection and/or diagnosis of SARS-CoV-2 by FDA under an Emergency Use Authorization (EUA). This EUA will remain in effect (meaning this test can be used) for the duration of the COVID-19 declaration under Section 564(b)(1) of the Act, 21 U.S.C. section 360bbb-3(b)(1), unless the authorization is terminated or revoked.  Performed at [facility], [address], [place], [place] [address]   Blood Culture (routine x 2)     Status: None   Collection Time: 04/22/23  6:40 PM   Specimen: BLOOD RIGHT ARM  Result Value Ref Range Status   Specimen Description BLOOD RIGHT ARM  Final   Special Requests   Final    BOTTLES DRAWN AEROBIC AND ANAEROBIC Blood Culture adequate volume   Culture   Final    NO GROWTH 5 DAYS Performed at [facility], [address] [place], [place]  [address]    Report Status 04/27/2023 FINAL  Final  Blood Culture (routine x 2)     Status: None   Collection Time: 04/22/23  7:08 PM   Specimen: BLOOD LEFT ARM  Result Value Ref Range Status   Specimen Description BLOOD LEFT ARM  Final   Special Requests   Final    BOTTLES DRAWN AEROBIC AND ANAEROBIC Blood Culture adequate volume   Culture   Final    NO GROWTH 5 DAYS Performed at [facility], [address], [place], [place] [address]    Report Status 04/27/2023 FINAL  Final  Surgical PCR screen     Status: None   Collection Time: 04/27/23 12:21 AM   Specimen: Nasal Mucosa; Nasal Swab  Result Value Ref Range Status   MRSA, PCR NEGATIVE NEGATIVE Final   Staphylococcus aureus NEGATIVE NEGATIVE Final    Comment: (NOTE) The Xpert SA Assay (FDA approved for NASAL specimens in patients 8 years of age and older), is one component of a comprehensive surveillance program. It is not intended to diagnose infection nor to guide or monitor treatment. Performed at [facility], [address], [place], [place] [address]     RADIOLOGY STUDIES/RESULTS: DG Abd Portable 1V  Result Date: 05/01/2023 CLINICAL DATA:  77 year old female NG tube placement. Postoperative day 4 status post exploratory laparotomy with sigmoid colectomy, colostomy. EXAM: PORTABLE ABDOMEN - 1 VIEW COMPARISON:  Abdomen radiographs yesterday and earlier. FINDINGS: Portable AP supine view at 0741 hours. Enteric tube terminates in the stomach, side hole the level of the gastric body. Ongoing dilated small bowel loops containing gas, up to 45 mm diameter. No significant change from yesterday. Partially visible pelvic drain. Stable cholecystectomy clips. IMPRESSION: 1. Enteric tube terminates in the stomach. 2. Persistent small bowel obstruction or ileus, no significant change from yesterday. 3. Pelvic drain(s). Electronically Signed   By: Odessa Fleming M.D.   On: 05/01/2023 08:18   DG Abd Portable 1V  Result Date:  04/30/2023 CLINICAL DATA:  Nasogastric tube placement. EXAM: PORTABLE ABDOMEN - 1 VIEW  COMPARISON:  Radiograph earlier today FINDINGS: Tip of the enteric tube below the diaphragm in the stomach, the side port is in the region of the gastroesophageal junction. Gaseous small bowel distension centrally is similar to earlier today. IMPRESSION: Tip of the enteric tube below the diaphragm in the stomach, side port in the region of the gastroesophageal junction. Recommend advancement. Electronically Signed   By: Narda Rutherford M.D.   On: 04/30/2023 20:55     LOS: 10 days   Jeoffrey Massed, MD  [facility]    To contact the attending provider between 7A-7P or the covering provider during after hours 7P-7A, please log into the web site [email or web address] and access using universal  password for that web site. If you do not have the password, please call the hospital operator.  05/02/2023, 9:47 AM

## 2023-05-02 NOTE — Progress Notes (Signed)
PHARMACY - ANTICOAGULATION CONSULT NOTE  Pharmacy Consult for Heparin gtt Indication: atrial fibrillation  Allergies  Allergen Reactions   Allopurinol Other (See Comments)    Reaction:  Dizziness    Clindamycin Anaphylaxis and Hives   Flublok [Influenza Vaccine Recombinant] Other (See Comments)    Fever 103 with no alternative explanation day after vaccine.  Clydie Braun Highfill FNP-C   Pneumococcal 13-Val Conj Vacc Itching, Swelling and Rash   Dronedarone Rash   Montelukast Other (See Comments)    Makes her loopy.   Brovana [Arformoterol]     Caused muscle pain   Budesonide     Caused extreme joint pain   Entresto [Sacubitril-Valsartan] Other (See Comments)    hypotension   Fosamax [Alendronate Sodium] Nausea Only   Jardiance [Empagliflozin]     Caused a vaginal infection   Meperidine Nausea And Vomiting   Microplegia Msa-Msg [Plegisol]     Loopy,diarrhea   Rosuvastatin Other (See Comments)    Reaction:  Muscle spasms    Tetracycline Hives   Adhesive [Tape] Rash   Lovastatin Rash and Other (See Comments)    Muscle Pain    Patient Measurements: Height: 5' (152.4 cm) Weight: 87.2 kg (192 lb 3.9 oz) IBW/kg (Calculated) : 45.5 Heparin Dosing Weight: 64 kg  Vital Signs: Temp: 97.9 F (36.6 C) (11/24 1935) Temp Source: Axillary (11/24 1935) BP: 140/75 (11/24 1935) Pulse Rate: 76 (11/24 1935)  Labs: Recent Labs    04/30/23 0639 05/01/23 0440 05/01/23 1950 05/02/23 0359 05/02/23 1301 05/02/23 2110  HGB 8.5* 8.9*  --  8.9*  --   --   HCT 27.6* 29.7*  --  29.8*  --   --   PLT 374 444*  --  412*  --   --   APTT  --   --   --   --   --  43*  HEPARINUNFRC  --   --    < > 0.20* 0.13* 0.22*  CREATININE 0.83 0.62  --  0.64  --   --    < > = values in this interval not displayed.   Estimated Creatinine Clearance: 58.7 mL/min (by C-G formula based on SCr of 0.64 mg/dL).  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 Last dose of Xarelto 04/21/23 at 1900. Admitted for recurrent diverticulitis, perforation, and perirectal abscess. 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 Cleared by surgery to start hep gtt for afib.  Heparin level is subtherapeutic (0.22) on 1200 units/hr. The IV site was changed earlier today (heparin level was 0.13 and the infusion rate was not changed)  Goal of Therapy:  Heparin level 0.3-0.7 units/ml Monitor platelets by anticoagulation protocol: Yes   Plan:  No bolus per surgery Increase heparin to 1350 units/hr Heparin level in 8hrs   Thank you for allowing pharmacy to be  a part of this patient's care.  Harland German, PharmD Clinical Pharmacist **Pharmacist phone directory can now be found on [email or web address] ([email or web address]).  Listed under [facility].

## 2023-05-02 NOTE — Progress Notes (Signed)
PHARMACY - ANTICOAGULATION CONSULT NOTE  Pharmacy Consult for heparin Indication: atrial fibrillation  Labs: Recent Labs    04/30/23 0639 05/01/23 0440 05/01/23 1950 05/02/23 0359  HGB 8.5* 8.9*  --  8.9*  HCT 27.6* 29.7*  --  29.8*  PLT 374 444*  --  412*  HEPARINUNFRC  --   --  0.28* 0.20*  CREATININE 0.83 0.62  --  0.64   Assessment: 77yo female subtherapeutic on heparin after rate change; no infusion issues or signs of bleeding per RN.  Goal of Therapy:  Heparin level 0.3-0.7 units/ml   Plan:  Increase heparin infusion by 2 units/kg/hr to 1200 units/hr. Check level in 8 hours.   Vernard Gambles, PharmD, BCPS 05/02/2023 5:00 AM

## 2023-05-02 NOTE — Progress Notes (Signed)
PHARMACY - TOTAL PARENTERAL NUTRITION CONSULT NOTE   Indication: Prolonged ileus  Patient Measurements: Height: 5' (152.4 cm) Weight: 87.2 kg (192 lb 3.9 oz) IBW/kg (Calculated) : 45.5 TPN AdjBW (KG): 54 Body mass index is 37.54 kg/m.   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 Patient has been NPO since surgery.   Glucose / Insulin: CBGs <180. 16u SSI  Electrolytes: K 3.3, Phos 2.1  s/p KPhos x1, CoCa 8.8. Others within normal limits.  Renal: SCr 0.64 Hepatic: LFTs/Tbili on 11/16 within normal limits.  Intake / Output; MIVF: UOP 0.1 ml/kg/hr. R-RLQ JP drain 100 mL; R-RUQ JP drain 20 mL. LBM 11/18. Colostomy output - 0 mL GI Imaging: 11/21 KUB moderately dilated loops of small bowel, c/w post-operative ileus GI Surgeries / Procedures:  11/19 ex lap with sigmoid colectomy and end left colostomy  Central access: PICC placed 04/30/23 TPN start date: 04/30/23  Nutritional Goals: Clinimix E 8/10 1L at 42 ml/hr with SMOF lipids + D10 at 42 ml/hr on MTWThFS Clinimix 8/10 with additive lytes 2L at 83 ml/hr on Sun - Average of 1551 kcals per day (97% of goal) and 91g protein per day (100% goal)  Electrolytes in 8/10 E 1000 mL: Na 35 mEq, K 30 mEq, Mag 5 mEq, Ca 5 mEq, Phos , Acetate 83, Cl 76 mEq Electrolytes in Clinimix E 2L bag: Na , K , Mag , Ca , Phos , Ac , Cl .   RD Assessment: Estimated Needs Total Energy Estimated Needs: 1600-1800 kcal Total Protein Estimated Needs: 75-95 gm Total Fluid Estimated Needs: >1.6L  Current Nutrition:  NPO except for ice chips  Plan:  For today (Sunday): Clinimix 8/10 with additive lytes 2L at 83 ml/hr on Sun with SMOF lipids 20.8 ml/hr x12 hours today - meeting  ~72% of needs.  Electrolytes in 8/10 E 1000 mL: Na 35 mEq, K 30 mEq, Mag 5 mEq, Ca 5 mEq, Phos , Acetate 83, Cl 76 mEq   Continue standard MVI and trace elements to TPN Continue Moderate q4h SSI and adjust as needed  Monitor TPN labs daily on Clinimix.  Kphos IV x1 (total of ~59meq potassium in addition to provided by 2L TPN today)  Calton Dach, PharmD, BCCCP Clinical Pharmacist 05/02/2023 7:06 AM

## 2023-05-02 NOTE — Progress Notes (Signed)
PHARMACY - ANTICOAGULATION CONSULT NOTE  Pharmacy Consult for Heparin gtt Indication: atrial fibrillation  Allergies  Allergen Reactions   Allopurinol Other (See Comments)    Reaction:  Dizziness    Clindamycin Anaphylaxis and Hives   Flublok [Influenza Vaccine Recombinant] Other (See Comments)    Fever 103 with no alternative explanation day after vaccine.  Clydie Braun Highfill FNP-C   Pneumococcal 13-Val Conj Vacc Itching, Swelling and Rash   Dronedarone Rash   Montelukast Other (See Comments)    Makes her loopy.   Brovana [Arformoterol]     Caused muscle pain   Budesonide     Caused extreme joint pain   Entresto [Sacubitril-Valsartan] Other (See Comments)    hypotension   Fosamax [Alendronate Sodium] Nausea Only   Jardiance [Empagliflozin]     Caused a vaginal infection   Meperidine Nausea And Vomiting   Microplegia Msa-Msg [Plegisol]     Loopy,diarrhea   Rosuvastatin Other (See Comments)    Reaction:  Muscle spasms    Tetracycline Hives   Adhesive [Tape] Rash   Lovastatin Rash and Other (See Comments)    Muscle Pain    Patient Measurements: Height: 5' (152.4 cm) Weight: 87.2 kg (192 lb 3.9 oz) IBW/kg (Calculated) : 45.5 Heparin Dosing Weight: 64 kg  Vital Signs: Temp: 98.4 F (36.9 C) (11/24 1153) Temp Source: Oral (11/24 1153) BP: 123/86 (11/24 1153) Pulse Rate: 95 (11/24 1153)  Labs: Recent Labs    04/30/23 0639 05/01/23 0440 05/01/23 1950 05/02/23 0359 05/02/23 1301  HGB 8.5* 8.9*  --  8.9*  --   HCT 27.6* 29.7*  --  29.8*  --   PLT 374 444*  --  412*  --   HEPARINUNFRC  --   --  0.28* 0.20* 0.13*  CREATININE 0.83 0.62  --  0.64  --    Estimated Creatinine Clearance: 58.7 mL/min (by C-G formula based on SCr of 0.64 mg/dL).  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 Last dose of Xarelto 04/21/23 at 1900. Admitted for recurrent diverticulitis, perforation, and perirectal abscess. 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 Cleared by surgery to start hep gtt for afib.  Heparin level is subtherapeutic (0.13) on 1200 units/hr. Trending down despite dose increases. RN stated it may be the IV site, so she will change site. No heparin rate change at this time but if level remains subtherapeutic after site change, increase rate. CBC stable. No s/sx of bleeding per RN.  Goal of Therapy:  Heparin level 0.3-0.7 units/ml Monitor platelets by anticoagulation protocol: Yes   Plan:  No bolus per surgery Continue heparin infusion at 1200 units/hr Check heparin level in 6 hours from IV site change & resumption of heparin Check daily heparin level Continue to monitor H&H and platelets   Thank you for allowing pharmacy to be a  part of this patient's care.  [name], PharmD PGY1 Pharmacy Resident 05/02/2023 2:17 PM

## 2023-05-02 NOTE — Plan of Care (Signed)
  Problem: Education: Goal: Ability to describe self-care measures that may prevent or decrease complications (Diabetes Survival Skills Education) will improve Outcome: Progressing Goal: Individualized Educational Video(s) Outcome: Progressing   Problem: Coping: Goal: Ability to adjust to condition or change in health will improve Outcome: Progressing   Problem: Fluid Volume: Goal: Ability to maintain a balanced intake and output will improve Outcome: Progressing   Problem: Health Behavior/Discharge Planning: Goal: Ability to identify and utilize available resources and services will improve Outcome: Progressing Goal: Ability to manage health-related needs will improve Outcome: Progressing   Problem: Metabolic: Goal: Ability to maintain appropriate glucose levels will improve Outcome: Progressing   Problem: Nutritional: Goal: Maintenance of adequate nutrition will improve Outcome: Progressing Goal: Progress toward achieving an optimal weight will improve Outcome: Progressing   Problem: Skin Integrity: Goal: Risk for impaired skin integrity will decrease Outcome: Progressing   Problem: Tissue Perfusion: Goal: Adequacy of tissue perfusion will improve Outcome: Progressing   Problem: Education: Goal: Knowledge of General Education information will improve Description: Including pain rating scale, medication(s)/side effects and non-pharmacologic comfort measures Outcome: Progressing   Problem: Health Behavior/Discharge Planning: Goal: Ability to manage health-related needs will improve Outcome: Progressing   Problem: Clinical Measurements: Goal: Ability to maintain clinical measurements within normal limits will improve Outcome: Progressing Goal: Will remain free from infection Outcome: Progressing Goal: Diagnostic test results will improve Outcome: Progressing Goal: Cardiovascular complication will be avoided Outcome: Progressing   Problem: Activity: Goal: Risk  for activity intolerance will decrease Outcome: Progressing   Problem: Nutrition: Goal: Adequate nutrition will be maintained Outcome: Progressing   Problem: Coping: Goal: Level of anxiety will decrease Outcome: Progressing   Problem: Elimination: Goal: Will not experience complications related to bowel motility Outcome: Progressing Goal: Will not experience complications related to urinary retention Outcome: Progressing   Problem: Pain Management: Goal: General experience of comfort will improve Outcome: Progressing   Problem: Safety: Goal: Ability to remain free from injury will improve Outcome: Progressing   Problem: Skin Integrity: Goal: Risk for impaired skin integrity will decrease Outcome: Progressing   Problem: Education: Goal: Understanding of discharge needs will improve Outcome: Progressing Goal: Verbalization of understanding of the causes of altered bowel function will improve Outcome: Progressing   Problem: Activity: Goal: Ability to tolerate increased activity will improve Outcome: Progressing   Problem: Bowel/Gastric: Goal: Gastrointestinal status for postoperative course will improve Outcome: Progressing   Problem: Health Behavior/Discharge Planning: Goal: Identification of community resources to assist with postoperative recovery needs will improve Outcome: Progressing   Problem: Nutritional: Goal: Will attain and maintain optimal nutritional status will improve Outcome: Progressing   Problem: Clinical Measurements: Goal: Postoperative complications will be avoided or minimized Outcome: Progressing   Problem: Respiratory: Goal: Respiratory status will improve Outcome: Progressing   Problem: Skin Integrity: Goal: Will show signs of wound healing Outcome: Progressing

## 2023-05-02 NOTE — Progress Notes (Addendum)
Paged general surgery regarding the leakage of the wound vac as wound care nurse couldnot be reached out.Got no any response till now.

## 2023-05-02 NOTE — Progress Notes (Addendum)
Primary RN took off the wound vac as the wound vac dressing was had leakage and placed wet to dry dressing with Kerlex per Dr. Stephanie Coup White's instruction in secure chat.

## 2023-05-03 ENCOUNTER — Ambulatory Visit (HOSPITAL_COMMUNITY): Admission: RE | Admit: 2023-05-03 | Payer: Medicare Other | Source: Home / Self Care | Admitting: Gastroenterology

## 2023-05-03 ENCOUNTER — Telehealth (HOSPITAL_COMMUNITY): Payer: Self-pay

## 2023-05-03 ENCOUNTER — Inpatient Hospital Stay (HOSPITAL_COMMUNITY): Payer: Medicare Other

## 2023-05-03 ENCOUNTER — Encounter (HOSPITAL_COMMUNITY): Admission: RE | Payer: Self-pay | Source: Home / Self Care

## 2023-05-03 DIAGNOSIS — K5720 Diverticulitis of large intestine with perforation and abscess without bleeding: Secondary | ICD-10-CM | POA: Diagnosis not present

## 2023-05-03 DIAGNOSIS — Z950 Presence of cardiac pacemaker: Secondary | ICD-10-CM | POA: Diagnosis not present

## 2023-05-03 DIAGNOSIS — J8489 Other specified interstitial pulmonary diseases: Secondary | ICD-10-CM | POA: Diagnosis not present

## 2023-05-03 DIAGNOSIS — E039 Hypothyroidism, unspecified: Secondary | ICD-10-CM | POA: Diagnosis not present

## 2023-05-03 LAB — COMPREHENSIVE METABOLIC PANEL
ALT: 12 U/L (ref 0–44)
AST: 14 U/L — ABNORMAL LOW (ref 15–41)
Albumin: 2.1 g/dL — ABNORMAL LOW (ref 3.5–5.0)
Alkaline Phosphatase: 44 U/L (ref 38–126)
Anion gap: 10 (ref 5–15)
BUN: 23 mg/dL (ref 8–23)
CO2: 24 mmol/L (ref 22–32)
Calcium: 8.3 mg/dL — ABNORMAL LOW (ref 8.9–10.3)
Chloride: 99 mmol/L (ref 98–111)
Creatinine, Ser: 0.78 mg/dL (ref 0.44–1.00)
GFR, Estimated: >60 mL/min (ref >60)
Glucose, Bld: 219 mg/dL — ABNORMAL HIGH (ref 70–99)
Potassium: 3.7 mmol/L (ref 3.5–5.1)
Sodium: 133 mmol/L — ABNORMAL LOW (ref 135–145)
Total Bilirubin: 0.4 mg/dL (ref <1.2)
Total Protein: 5.2 g/dL — ABNORMAL LOW (ref 6.5–8.1)

## 2023-05-03 LAB — HEPARIN LEVEL (UNFRACTIONATED)
Heparin Unfractionated: 0.19 [IU]/mL — ABNORMAL LOW (ref 0.30–0.70)
Heparin Unfractionated: 0.21 [IU]/mL — ABNORMAL LOW (ref 0.30–0.70)

## 2023-05-03 LAB — TRIGLYCERIDES: Triglycerides: 184 mg/dL — ABNORMAL HIGH (ref <150)

## 2023-05-03 LAB — GLUCOSE, CAPILLARY
Glucose-Capillary: 175 mg/dL — ABNORMAL HIGH (ref 70–99)
Glucose-Capillary: 140 mg/dL — ABNORMAL HIGH (ref 70–99)
Glucose-Capillary: 199 mg/dL — ABNORMAL HIGH (ref 70–99)

## 2023-05-03 LAB — PHOSPHORUS: Phosphorus: 2.3 mg/dL — ABNORMAL LOW (ref 2.5–4.6)

## 2023-05-03 LAB — CBC
HCT: 28.5 % — ABNORMAL LOW (ref 36.0–46.0)
Hemoglobin: 8.8 g/dL — ABNORMAL LOW (ref 12.0–15.0)
MCH: 26.3 pg (ref 26.0–34.0)
MCHC: 30.9 g/dL (ref 30.0–36.0)
MCV: 85.1 fL (ref 80.0–100.0)
Platelets: 388 10*3/uL (ref 150–400)
RBC: 3.35 MIL/uL — ABNORMAL LOW (ref 3.87–5.11)
RDW: 15.6 % — ABNORMAL HIGH (ref 11.5–15.5)
WBC: 23.3 10*3/uL — ABNORMAL HIGH (ref 4.0–10.5)
nRBC: 0.4 % — ABNORMAL HIGH (ref 0.0–0.2)

## 2023-05-03 LAB — MAGNESIUM: Magnesium: 2.2 mg/dL (ref 1.7–2.4)

## 2023-05-03 LAB — TROPONIN I (HIGH SENSITIVITY): Troponin I (High Sensitivity): 5 ng/L (ref <18)

## 2023-05-03 SURGERY — COLONOSCOPY WITH PROPOFOL
Anesthesia: Monitor Anesthesia Care

## 2023-05-03 MED ORDER — METHOCARBAMOL 1000 MG/10ML IJ SOLN
500.0000 mg | Freq: Four times a day (QID) | INTRAMUSCULAR | Status: DC
  Administered 2023-05-03 – 2023-05-13 (×39): 500 mg via INTRAVENOUS
  Filled 2023-05-03 (×4): qty 10
  Filled 2023-05-03 (×2): qty 5
  Filled 2023-05-03: qty 10
  Filled 2023-05-03 (×2): qty 5
  Filled 2023-05-03: qty 10
  Filled 2023-05-03: qty 5
  Filled 2023-05-03 (×2): qty 10
  Filled 2023-05-03: qty 5
  Filled 2023-05-03: qty 10
  Filled 2023-05-03 (×5): qty 5
  Filled 2023-05-03: qty 10
  Filled 2023-05-03 (×3): qty 5
  Filled 2023-05-03: qty 10
  Filled 2023-05-03 (×6): qty 5
  Filled 2023-05-03 (×2): qty 10
  Filled 2023-05-03: qty 5
  Filled 2023-05-03: qty 10
  Filled 2023-05-03 (×3): qty 5
  Filled 2023-05-03: qty 10
  Filled 2023-05-03: qty 5
  Filled 2023-05-03 (×2): qty 10

## 2023-05-03 MED ORDER — HEPARIN (PORCINE) 25000 UT/250ML-% IV SOLN
1900.0000 [IU]/h | INTRAVENOUS | Status: DC
  Administered 2023-05-03 – 2023-05-04 (×2): 1700 [IU]/h via INTRAVENOUS
  Administered 2023-05-05 – 2023-05-18 (×19): 1900 [IU]/h via INTRAVENOUS
  Filled 2023-05-03 (×26): qty 250

## 2023-05-03 MED ORDER — IOHEXOL 350 MG/ML SOLN
75.0000 mL | Freq: Once | INTRAVENOUS | Status: AC | PRN
  Administered 2023-05-03: 75 mL via INTRAVENOUS

## 2023-05-03 MED ORDER — PANTOPRAZOLE SODIUM 40 MG IV SOLR
40.0000 mg | Freq: Two times a day (BID) | INTRAVENOUS | Status: DC
  Administered 2023-05-03 – 2023-05-16 (×27): 40 mg via INTRAVENOUS
  Filled 2023-05-03 (×27): qty 10

## 2023-05-03 MED ORDER — IOHEXOL 300 MG/ML  SOLN
30.0000 mL | Freq: Once | INTRAMUSCULAR | Status: AC | PRN
  Administered 2023-05-03: 30 mL via ORAL

## 2023-05-03 MED ORDER — FAT EMUL FISH OIL/PLANT BASED 20% (SMOFLIPID)IV EMUL
250.0000 mL | INTRAVENOUS | Status: AC
  Administered 2023-05-03: 250 mL via INTRAVENOUS
  Filled 2023-05-03: qty 250

## 2023-05-03 MED ORDER — DEXTROSE 10 % IV SOLN
INTRAVENOUS | Status: DC
  Filled 2023-05-03 (×3): qty 500

## 2023-05-03 MED ORDER — PIPERACILLIN-TAZOBACTAM 3.375 G IVPB
3.3750 g | Freq: Three times a day (TID) | INTRAVENOUS | Status: AC
  Administered 2023-05-03 – 2023-05-06 (×11): 3.375 g via INTRAVENOUS
  Filled 2023-05-03 (×11): qty 50

## 2023-05-03 MED ORDER — DEXTROSE 10 % IV SOLN
INTRAVENOUS | Status: DC
  Filled 2023-05-03: qty 1000

## 2023-05-03 MED ORDER — ACETAMINOPHEN 10 MG/ML IV SOLN
1000.0000 mg | Freq: Four times a day (QID) | INTRAVENOUS | Status: AC
  Administered 2023-05-03 – 2023-05-04 (×4): 1000 mg via INTRAVENOUS
  Filled 2023-05-03 (×5): qty 100

## 2023-05-03 MED ORDER — TRACE MINERALS CU-MN-SE-ZN 300-55-60-3000 MCG/ML IV SOLN
INTRAVENOUS | Status: AC
  Filled 2023-05-03 (×2): qty 1000

## 2023-05-03 MED ORDER — SODIUM CHLORIDE 0.9 % IV SOLN
30.0000 mmol | Freq: Once | INTRAVENOUS | Status: AC
  Administered 2023-05-03: 30 mmol via INTRAVENOUS
  Filled 2023-05-03: qty 10

## 2023-05-03 NOTE — Progress Notes (Signed)
OT Cancellation Note  Patient Details Name: MATILDE PETRUSO MRN: [id number] DOB: 12-17-1945   Cancelled Treatment:    Reason Eval/Treat Not Completed:  (Pt with increased abdominal pain, nausea and vomiting.) Will continue to follow.   Evern Bio 05/03/2023, 12:46 PM Berna Spare, OTR/L Acute Rehabilitation Services Office: [phone]

## 2023-05-03 NOTE — Telephone Encounter (Signed)
Pt is currently still admitted to hospital, she had significant abdominal surgery and the plan is for her to be d/c to SNF. Unknown of this time line and time frame she will remain in hosp and will stay in SNF, but she will be d/c from paramedicine for now and can be referred again once she returns home and f/u in clinic again.    Patient is now discharged from [facility].  Patient has/has not met the following goals:  Yes :Patient expresses basic understanding of medications and what they are for Yes :Patient able to verbalize heart failure specific dietary/fluid restrictions Yes :Patient is aware of who to call if they have medical concerns or if they need to schedule or change appts Yes :Patient has a scale for daily weights and weighs regularly Yes :Patient able to verbalize concerning symptoms when they should call the HF clinic (weight gain ranges, etc) Yes :Patient has a PCP and has seen within the past year or has upcoming appt Yes :Patient has reliable access to getting their medications Yes :Patient has shown they are able to reorder medications reliably Yes :Patient has had admission in past 30 days- if yes how many? Yes :Patient has had admission in past 90 days- if yes how many?  Discharge Comments:  Pt has struggled with diverticulitis and infections and has been admitted multiple times for same and with the present admission with significant surgery and once d/c appropriate she will go to SNF.   Can be referred again once home and f/u in clinic is made.   Kerry Hough, EMT-Paramedic  [phone] 05/03/2023

## 2023-05-03 NOTE — Progress Notes (Signed)
Resume zosyn 3.375g IV q8 for likely residual abscess per Dr. Jerral Ralph.  Ulyses Southward, PharmD, BCIDP, AAHIVP, CPP Infectious Disease Pharmacist 05/03/2023 9:36 AM

## 2023-05-03 NOTE — Progress Notes (Addendum)
PROGRESS NOTE        PATIENT DETAILS Name: Kathleen [name] Age: 77 y.o. Sex: female Date of Birth: 12-16-1945 Admit Date: 04/22/2023 Admitting Physician Gery Pray, MD [id number], Bernadene Bell, MD  Brief Summary: Patient is a 77 y.o.  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  Admitted by [facility]-started on empiric IV antibiotics-underwent preop assessment by cardiology-and then subsequently underwent  exploratory laparotomy with sigmoid colectomy and end ostomy on 11/19.  Postoperative course complicated by ileus-requiring NG tube decompression-and TNA for nutrition.  See below for further details.  Significant events: 11/14>> admit to [facility] 11/19>>exploratory laparotomy with sigmoid colectomy and end ostomy   Significant studies: 11/14>> CT abdomen/pelvis: Worsening signal right diverticulitis-localized perforation-5.6 cm perirectal abscess, 6.0 cm left lower quadrant abscess. 11/22>> x-ray abdomen: Consistent with postoperative ileus.  Significant microbiology data: 11/14>> blood culture: No growth 11/14>> COVID/influenza/RSV PCR: Negative  Procedures: 11/19>>exploratory laparotomy with sigmoid colectomy  end ostomy on 11/19  Consults: Cardiology GI General surgery  Subjective: Nauseous-retching overnight.  No output in ostomy.  Objective: Vitals: Blood pressure 136/72, pulse 84, temperature 98.8 F (37.1 C), temperature source Oral, resp. rate (!) 26, height 5' (1.524 m), weight 87.2 kg, SpO2 98%.   Exam: Gen Exam:Alert awake-not in any distress HEENT:atraumatic, normocephalic Chest: B/L clear to auscultation anteriorly CVS:S1S2 regular Abdomen: Soft-distended-Pink drainage in ostomy-no stool. Extremities:no edema Neurology: Non  focal-generalized weakness Skin: no rash  Pertinent Labs/Radiology:    Latest Ref Rng & Units 05/03/2023    6:04 AM 05/02/2023    3:59 AM 05/01/2023    4:40 AM  CBC  WBC 4.0 - 10.5 K/uL 23.3  18.1  22.3   Hemoglobin 12.0 - 15.0 g/dL 8.8  8.9  8.9   Hematocrit 36.0 - 46.0 % 28.5  29.8  29.7   Platelets 150 - 400 K/uL 388  412  444     Lab Results  Component Value Date   NA 133 (L) 05/03/2023   K 3.7 05/03/2023   CL 99 05/03/2023   CO2 24 05/03/2023     Assessment/Plan: 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 with localized perforation and pelvic abscess-s/p ex lap with colectomy/ostomy on 11/19-complicated by postoperative ileus requiring NG tube decompression Worsening leukocytosis-nauseous-General Surgery planning repeat CT scan today. NG tube being advanced this morning-suspicion that it may have not been in the appropriate place Continue TNA per pharmacy Extend Zosyn beyond 5 days-restart today-will decide on duration depending on CT scan results. General Surgery continues to follow.    Hypokalemia/hypophosphatemia Repleted  Normocytic anemia Due to critical illness-no evidence of blood loss Follow CBC  Persistent atrial fibrillation Telemetry monitoring Tikosyn IV heparin restarted 11/23-Hb stable Attempt to keep K> 4  History of AV nodal ablation-s/p PPM ([employer]) Telemetry monitoring  Chronic HFrEF (EF 35-40% on 4/9 by TTE) Volume status stable Aldactone on hold-resume when oral intake is stable  Chronic hypoxic respiratory failure on home O2 COPD BOOP Stable Pulmonary tolerating with incentive spirometry/flutter valve Mobilization with PT/OT Continue bronchodilators  GERD PPI  DM-2 (A1c 6.2 on 11/15) CBGs stable on SSI  Recent Labs    05/02/23 1934 05/02/23 2316 05/03/23 0315  GLUCAP 143* 175* 175*   Nutrition Status: Nutrition Problem: Moderate  Malnutrition Etiology: chronic illness Signs/Symptoms: mild  fat depletion, mild muscle depletion, energy intake < 75% for > 7 days Interventions: Ensure Enlive (each supplement provides 350kcal and 20 grams of protein), Magic cup, MVI  Obesity: Estimated body mass index is 37.54 kg/m as calculated from the following:   Height as of this encounter: 5' (1.524 m).   Weight as of this encounter: 87.2 kg.   Code status:   Code Status: Limited: Do not attempt resuscitation (DNR) -DNR-LIMITED -Do Not Intubate/DNI    DVT Prophylaxis: IV heparin   Family Communication: Son/daughter at bedside   Disposition Plan: Status is: Inpatient Remains inpatient appropriate because: Severity of illness   Planned Discharge Destination:Skilled nursing facility   Diet: Diet Order             Diet NPO time specified Except for: Ice Chips  Diet effective now                     Antimicrobial agents: Anti-infectives (From admission, onward)    Start     Dose/Rate Route Frequency Ordered Stop   05/03/23 1400  piperacillin-tazobactam (ZOSYN) IVPB 3.375 g        3.375 g 12.5 mL/hr over 240 Minutes Intravenous Every 8 hours 05/03/23 0935     04/27/23 1021  sodium chloride 0.9 % with cefoTEtan (CEFOTAN) ADS Med       Note to Pharmacy: Shanda Bumps M: cabinet override      04/27/23 1021 04/27/23 1253   04/26/23 0800  cefoTEtan (CEFOTAN) 2 g in sodium chloride 0.9 % 100 mL IVPB  Status:  Discontinued        2 g 200 mL/hr over 30 Minutes Intravenous To [facility] 04/23/23 2111 04/27/23 0800   04/22/23 2300  piperacillin-tazobactam (ZOSYN) IVPB 3.375 g        3.375 g 12.5 mL/hr over 240 Minutes Intravenous Every 8 hours 04/22/23 2256 05/02/23 2359   04/22/23 1900  piperacillin-tazobactam (ZOSYN) IVPB 3.375 g        3.375 g 100 mL/hr over 30 Minutes Intravenous  Once 04/22/23 1852 04/22/23 1954        MEDICATIONS: Scheduled Meds:  Chlorhexidine Gluconate Cloth  6 each Topical Daily   dofetilide  250 mcg Oral Q12H   fluticasone  furoate-vilanterol  1 puff Inhalation Daily   insulin aspart  0-15 Units Subcutaneous Q4H   methocarbamol (ROBAXIN) injection  500 mg Intravenous Q6H   mouth rinse  15 mL Mouth Rinse 4 times per day   pantoprazole (PROTONIX) IV  40 mg Intravenous Q24H   revefenacin  175 mcg Nebulization Daily   Continuous Infusions:  acetaminophen     dextrose     TPN (CLINIMIX-E) Adult     And   fat emul(SMOFlipid)     heparin 1,550 Units/hr (05/03/23 0731)   piperacillin-tazobactam (ZOSYN)  IV     potassium PHOSPHATE IVPB (in mmol)     promethazine (PHENERGAN) injection (IM or IVPB) 12.5 mg (05/02/23 1839)   TPN (CLINIMIX) Adult with Electrolyte Additives 83 mL/hr at 05/03/23 0400   PRN Meds:.albuterol, guaiFENesin-dextromethorphan, HYDROmorphone (DILAUDID) injection, ondansetron (ZOFRAN) IV, promethazine (PHENERGAN) injection (IM or IVPB), sodium chloride flush   I have personally reviewed following labs and imaging studies  LABORATORY DATA: CBC: Recent Labs  Lab 04/28/23 0442 04/29/23 0448 04/30/23 0639 05/01/23 0440 05/02/23 0359 05/03/23 0604  WBC 18.7* 19.3* 18.9* 22.3* 18.1* 23.3*  NEUTROABS 16.9*  --   --   --   --   --  HGB 9.3* 9.1* 8.5* 8.9* 8.9* 8.8*  HCT 29.4* 28.6* 27.6* 29.7* 29.8* 28.5*  MCV 81.9 82.9 83.6 84.4 85.9 85.1  PLT 333 392 374 444* 412* 388    Basic Metabolic Panel: Recent Labs  Lab 04/29/23 0448 04/29/23 1712 04/30/23 0639 05/01/23 0440 05/02/23 0359 05/03/23 0604  NA 133* 135 136 136 136 133*  K 3.5 3.7 4.4 3.4* 3.3* 3.7  CL 98 97* 100 100 101 99  CO2 26 26 26 26 25 24   GLUCOSE 157* 135* 143* 195* 171* 219*  BUN 17 21 15 16 18 23   CREATININE 0.97 0.88 0.83 0.62 0.64 0.78  CALCIUM 7.9* 7.7* 7.7* 7.9* 8.0* 8.3*  MG 2.4  --  2.4 2.4 2.4 2.2  PHOS  --   --  1.9* 2.4* 2.1* 2.3*    GFR: Estimated Creatinine Clearance: 58.7 mL/min (by C-G formula based on SCr of 0.78 mg/dL).  Liver Function Tests: Recent Labs  Lab 05/01/23 0440  05/02/23 0359 05/03/23 0604  AST 10* 12* 14*  ALT 13 11 12   ALKPHOS 54 47 44  BILITOT 0.4 0.4 0.4  PROT 5.1* 5.2* 5.2*  ALBUMIN 2.0* 2.1* 2.1*   No results for input(s): "LIPASE", "AMYLASE" in the last 168 hours. No results for input(s): "AMMONIA" in the last 168 hours.  Coagulation Profile: No results for input(s): "INR", "PROTIME" in the last 168 hours.  Cardiac Enzymes: No results for input(s): "CKTOTAL", "CKMB", "CKMBINDEX", "TROPONINI" in the last 168 hours.  BNP (last 3 results) No results for input(s): "PROBNP" in the last 8760 hours.  Lipid Profile: Recent Labs    05/03/23 0604  TRIG 184*    Thyroid Function Tests: No results for input(s): "TSH", "T4TOTAL", "FREET4", "T3FREE", "THYROIDAB" in the last 72 hours.  Anemia Panel: No results for input(s): "VITAMINB12", "FOLATE", "FERRITIN", "TIBC", "IRON", "RETICCTPCT" in the last 72 hours.  Urine analysis:    Component Value Date/Time   COLORURINE AMBER (A) 04/23/2023 0623   APPEARANCEUR CLOUDY (A) 04/23/2023 0623   APPEARANCEUR Hazy 12/25/2012 1653   LABSPEC 1.024 04/23/2023 0623   LABSPEC 1.021 12/25/2012 1653   PHURINE 5.0 04/23/2023 0623   GLUCOSEU NEGATIVE 04/23/2023 0623   GLUCOSEU NEGATIVE 03/17/2023 1003   HGBUR MODERATE (A) 04/23/2023 0623   BILIRUBINUR NEGATIVE 04/23/2023 0623   BILIRUBINUR Negative 12/25/2012 1653   KETONESUR NEGATIVE 04/23/2023 0623   PROTEINUR 30 (A) 04/23/2023 0623   UROBILINOGEN 0.2 03/17/2023 1003   NITRITE NEGATIVE 04/23/2023 0623   LEUKOCYTESUR LARGE (A) 04/23/2023 0623   LEUKOCYTESUR Negative 12/25/2012 1653    Sepsis Labs: Lactic Acid, Venous    Component Value Date/Time   LATICACIDVEN 0.8 04/23/2023 0114    MICROBIOLOGY: Recent Results (from the past 240 hour(s))  Surgical PCR screen     Status: None   Collection Time: 04/27/23 12:21 AM   Specimen: Nasal Mucosa; Nasal Swab  Result Value Ref Range Status   MRSA, PCR NEGATIVE NEGATIVE Final   Staphylococcus  aureus NEGATIVE NEGATIVE Final    Comment: (NOTE) The Xpert SA Assay (FDA approved for NASAL specimens in patients 20 years of age and older), is one component of a comprehensive surveillance program. It is not intended to diagnose infection nor to guide or monitor treatment. Performed at [facility], [address], [place], [place] [address]     RADIOLOGY STUDIES/RESULTS: DG Abd Portable 1V  Result Date: 05/03/2023 CLINICAL DATA:  77 year old female enteric tube placement. Postoperative day 6 status post exploratory laparotomy with sigmoid  colectomy. EXAM: PORTABLE ABDOMEN - 1 VIEW COMPARISON:  05/01/2023 and earlier. FINDINGS: Portable AP supine view at 0850 hours. The enteric tube has been pulled back, side hole is now at the level of the esophagus. The tube tip is at the GEJ. Stable cholecystectomy clips. Continued gas-filled and dilated small bowel loops in the visible abdomen. Continued featureless appearance large bowel gas in the left abdomen. Left lung base opacity. Cardiac pacer leads. IMPRESSION: 1. Enteric tube has been pulled back with tip now at the GEJ. Advance 8 cm to ensure side hole placement within the stomach. 2. Persistent abnormal visible bowel-gas pattern compatible with bowel obstruction or ileus. 3. Left lung base opacity, consider pleural effusion and atelectasis. Electronically Signed   By: Odessa Fleming M.D.   On: 05/03/2023 09:26     LOS: 11 days   Jeoffrey Massed, MD  [facility]    To contact the attending provider between 7A-7P or the covering provider during after hours 7P-7A, please log into the web site [email or web address] and access using universal [place] password for that web site. If you do not have the password, please call the hospital operator.  05/03/2023, 9:49 AM

## 2023-05-03 NOTE — Progress Notes (Signed)
PHARMACY - ANTICOAGULATION CONSULT NOTE  Pharmacy Consult for Heparin gtt Indication: atrial fibrillation  Allergies  Allergen Reactions   Allopurinol Other (See Comments)    Reaction:  Dizziness    Clindamycin Anaphylaxis and Hives   Flublok [Influenza Vaccine Recombinant] Other (See Comments)    Fever 103 with no alternative explanation day after vaccine.  Clydie Braun Highfill FNP-C   Pneumococcal 13-Val Conj Vacc Itching, Swelling and Rash   Dronedarone Rash   Montelukast Other (See Comments)    Makes her loopy.   Brovana [Arformoterol]     Caused muscle pain   Budesonide     Caused extreme joint pain   Entresto [Sacubitril-Valsartan] Other (See Comments)    hypotension   Fosamax [Alendronate Sodium] Nausea Only   Jardiance [Empagliflozin]     Caused a vaginal infection   Meperidine Nausea And Vomiting   Microplegia Msa-Msg [Plegisol]     Loopy,diarrhea   Rosuvastatin Other (See Comments)    Reaction:  Muscle spasms    Tetracycline Hives   Adhesive [Tape] Rash   Lovastatin Rash and Other (See Comments)    Muscle Pain    Patient Measurements: Height: 5' (152.4 cm) Weight: 87.2 kg (192 lb 3.9 oz) IBW/kg (Calculated) : 45.5 Heparin Dosing Weight: 64 kg  Vital Signs: Temp: 98.8 F (37.1 C) (11/25 0900) Temp Source: Oral (11/25 0900) BP: 129/54 (11/25 1700) Pulse Rate: 73 (11/25 1700)  Labs: Recent Labs    05/01/23 0440 05/01/23 1950 05/02/23 0359 05/02/23 1301 05/02/23 2110 05/03/23 0604 05/03/23 1721  HGB 8.9*  --  8.9*  --   --  8.8*  --   HCT 29.7*  --  29.8*  --   --  28.5*  --   PLT 444*  --  412*  --   --  388  --   APTT  --   --   --   --  43*  --   --   HEPARINUNFRC  --    < > 0.20*   < > 0.22* 0.19* 0.21*  CREATININE 0.62  --  0.64  --   --  0.78  --   TROPONINIHS  --   --   --   --   --  5  --    < > = values in this interval not displayed.   Estimated Creatinine Clearance: 58.7 mL/min (by C-G formula based on SCr of 0.78 mg/dL).  Medical  History: 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    Concussion 10/04/2021   COPD (chronic obstructive pulmonary disease) (HCC)    Diverticulitis    Gout    Hypertension    Longstanding persistent atrial fibrillation (HCC)    on Xarelto   Nonischemic cardiomyopathy (HCC)    Obesity    Pacemaker    Spontaneous pneumothorax 2013   Assessment: [name] with history of atrial fibrillation on Xarelto prior to admission. Last dose of Xarelto 04/21/23 at 1900. Admitted for recurrent diverticulitis, perforation, and perirectal abscess. 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 Cleared by surgery to start hep gtt for afib.  Heparin level is still subtherapeutic 0.21 on heparin 1550 units/hr. No problems with the infusion or bleeding reported per RN.  Goal of Therapy:  Heparin level  0.3-0.7 units/ml Monitor platelets by anticoagulation protocol: Yes   Plan:  No bolus per surgery Increase heparin to 1700 units/hr Heparin level in 8hrs   Loralee Pacas, PharmD, BCPS 05/03/2023 6:09 PM  Please check AMION for all [facility] Pharmacy phone numbers After 10:00 PM, call Main Pharmacy [phone]

## 2023-05-03 NOTE — Progress Notes (Addendum)
Patient ID: DEVONA SNEIDER, female   DOB: [birth year], 77 y.o.   MRN: [id number] [facility] Surgery Progress Note  6 Days Post-Op  Subjective: CC-  Having 10/10 abdominal pain. She has vomited multiple times around NG tube. Ostomy with no output. WBC up 23, afebrile  Objective: Vital signs in last 24 hours: Temp:  [97.5 F (36.4 C)-98.9 F (37.2 C)] 98.8 F (37.1 C) (11/25 0900) Pulse Rate:  [75-95] 84 (11/25 0900) Resp:  [16-26] 26 (11/25 0900) BP: (123-144)/(47-86) 136/72 (11/25 0800) SpO2:  [95 %-100 %] 98 % (11/25 0900) Last BM Date : 04/26/23  Intake/Output from previous day: 11/24 0701 - 11/25 0700 In: 920.9 [P.O.:100; I.V.:198; IV Piggyback:623] Out: 2015 [Urine:1300; Emesis/NG output:600; Drains:115] Intake/Output this shift: No intake/output data recorded.  PE: Gen:  Alert, NAD Abd: distended but soft, generalized tenderness, drain x2 serosanguinous, ostomy pale pink with no stool in bag   Lab Results:  Recent Labs    05/02/23 0359 05/03/23 0604  WBC 18.1* 23.3*  HGB 8.9* 8.8*  HCT 29.8* 28.5*  PLT 412* 388   BMET Recent Labs    05/02/23 0359 05/03/23 0604  NA 136 133*  K 3.3* 3.7  CL 101 99  CO2 25 24  GLUCOSE 171* 219*  BUN 18 23  CREATININE 0.64 0.78  CALCIUM 8.0* 8.3*   PT/INR No results for input(s): "LABPROT", "INR" in the last 72 hours. CMP     Component Value Date/Time   NA 133 (L) 05/03/2023 0604   NA 130 (L) 12/28/2012 0748   K 3.7 05/03/2023 0604   K 4.6 12/28/2012 0748   CL 99 05/03/2023 0604   CL 95 (L) 12/28/2012 0748   CO2 24 05/03/2023 0604   CO2 23 12/28/2012 0748   GLUCOSE 219 (H) 05/03/2023 0604   GLUCOSE 391 (H) 12/28/2012 0748   BUN 23 05/03/2023 0604   BUN 32 (H) 12/28/2012 0748   CREATININE 0.78 05/03/2023 0604   CREATININE 0.86 01/27/2023 1137   CALCIUM 8.3 (L) 05/03/2023 0604   CALCIUM 14.3 (HH) 06/26/2019 1618   PROT 5.2 (L) 05/03/2023 0604   ALBUMIN 2.1 (L) 05/03/2023 0604   AST 14 (L) 05/03/2023  0604   ALT 12 05/03/2023 0604   ALKPHOS 44 05/03/2023 0604   BILITOT 0.4 05/03/2023 0604   GFRNONAA >60 05/03/2023 0604   GFRNONAA 43 (L) 12/28/2012 0748   GFRAA 58 (L) 06/28/2019 0427   GFRAA 50 (L) 12/28/2012 0748   Lipase     Component Value Date/Time   LIPASE 30 04/22/2023 1840       Studies/Results: No results found.  Anti-infectives: Anti-infectives (From admission, onward)    Start     Dose/Rate Route Frequency Ordered Stop   04/27/23 1021  sodium chloride 0.9 % with cefoTEtan (CEFOTAN) ADS Med       Note to Pharmacy: Shanda Bumps M: cabinet override      04/27/23 1021 04/27/23 1253   04/26/23 0800  cefoTEtan (CEFOTAN) 2 g in sodium chloride 0.9 % 100 mL IVPB  Status:  Discontinued        2 g 200 mL/hr over 30 Minutes Intravenous To ShortStay Surgical 04/23/23 2111 04/27/23 0800   04/22/23 2300  piperacillin-tazobactam (ZOSYN) IVPB 3.375 g        3.375 g 12.5 mL/hr over 240 Minutes Intravenous Every 8 hours 04/22/23 2256 05/02/23 2359   04/22/23 1900  piperacillin-tazobactam (ZOSYN) IVPB 3.375 g        3.375 g 100  mL/hr over 30 Minutes Intravenous  Once 04/22/23 1852 04/22/23 1954        Assessment/Plan POD #6 elap, sigmoid colectomy, colostomy- MW -expect some drainage from vagina although this appears to have abated -Ongoing ileus and worsening leukocytosis today. Will obtain CT a/p to evaluate for intraabdominal complication. NG does not appear to be working - check film for placement, may need to be advanced prior to giving oral contrast for CT scan. Continue NG to LIWS after scan -oob, pulm toilet -WOC to replace Vac today. Also following for new ostomy. Will need ostomy clinic referral at discharge -Continue PICC/TPN 11/22 --> -continue JP drain x2 - currently serosanguinous  ID zosyn 11/14>> VTE - heparin heparin gtt for afib  FEN - TPN, NPO/NGT to LIWS    LOS: 11 days    Franne Forts, [facility] Surgery 05/03/2023, 9:08  AM Please see Amion for pager number during day hours 7:00am-4:30pm

## 2023-05-03 NOTE — Progress Notes (Signed)
Patient is alert, awake, up all night asked for IV Dilaudid Q2. Patient had nausea, vomiting , unable to take Tikosyn was given 4 mg  IV Zofran.  PT got up to bedside commode 700 mls urine out put. NG tube suction . Abdominal wet dry dressing is dry , intact. Patient complained chest pain , MD notified . Completed EKG per ordered.

## 2023-05-03 NOTE — Progress Notes (Signed)
PT Cancellation Note  Patient Details Name: Kathleen [name] MRN: [id number] DOB: 1946-02-23   Cancelled Treatment:    Reason Eval/Treat Not Completed: Pain limiting ability to participate;Medical issues which prohibited therapy. Pt with N&V and 10/10 abdominal pain. Pt declining participation. Scheduled for CT.   Ilda Foil 05/03/2023, 9:41 AM

## 2023-05-03 NOTE — Progress Notes (Signed)
PHARMACY - ANTICOAGULATION CONSULT NOTE  Pharmacy Consult for Heparin gtt Indication: atrial fibrillation  Allergies  Allergen Reactions   Allopurinol Other (See Comments)    Reaction:  Dizziness    Clindamycin Anaphylaxis and Hives   Flublok [Influenza Vaccine Recombinant] Other (See Comments)    Fever 103 with no alternative explanation day after vaccine.  Clydie Braun Highfill FNP-C   Pneumococcal 13-Val Conj Vacc Itching, Swelling and Rash   Dronedarone Rash   Montelukast Other (See Comments)    Makes her loopy.   Brovana [Arformoterol]     Caused muscle pain   Budesonide     Caused extreme joint pain   Entresto [Sacubitril-Valsartan] Other (See Comments)    hypotension   Fosamax [Alendronate Sodium] Nausea Only   Jardiance [Empagliflozin]     Caused a vaginal infection   Meperidine Nausea And Vomiting   Microplegia Msa-Msg [Plegisol]     Loopy,diarrhea   Rosuvastatin Other (See Comments)    Reaction:  Muscle spasms    Tetracycline Hives   Adhesive [Tape] Rash   Lovastatin Rash and Other (See Comments)    Muscle Pain    Patient Measurements: Height: 5' (152.4 cm) Weight: 87.2 kg (192 lb 3.9 oz) IBW/kg (Calculated) : 45.5 Heparin Dosing Weight: 64 kg  Vital Signs: Temp: 97.7 F (36.5 C) (11/25 0312) Temp Source: Axillary (11/25 0312) BP: 136/47 (11/25 0312) Pulse Rate: 76 (11/25 0312)  Labs: Recent Labs    05/01/23 0440 05/01/23 1950 05/02/23 0359 05/02/23 1301 05/02/23 2110 05/03/23 0604  HGB 8.9*  --  8.9*  --   --  8.8*  HCT 29.7*  --  29.8*  --   --  28.5*  PLT 444*  --  412*  --   --  388  APTT  --   --   --   --  43*  --   HEPARINUNFRC  --    < > 0.20* 0.13* 0.22* 0.19*  CREATININE 0.62  --  0.64  --   --  0.78  TROPONINIHS  --   --   --   --   --  5   < > = values in this interval not displayed.   Estimated Creatinine Clearance: 58.7 mL/min (by C-G formula based on SCr of 0.78 mg/dL).  Medical History: Past Medical History:  Diagnosis Date    Asthma    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HCC)    CHF (congestive heart failure) (HCC)    Chronic renal insufficiency    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    Concussion 10/04/2021   COPD (chronic obstructive pulmonary disease) (HCC)    Diverticulitis    Gout    Hypertension    Longstanding persistent atrial fibrillation (HCC)    on Xarelto   Nonischemic cardiomyopathy (HCC)    Obesity    Pacemaker    Spontaneous pneumothorax 2013   Assessment: [name] with history of atrial fibrillation on Xarelto prior to admission. Last dose of Xarelto 04/21/23 at 1900. Admitted for recurrent diverticulitis, perforation, and perirectal abscess. 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 Cleared by surgery to start hep gtt for afib.  Heparin level is still subtherapeutic. Hgb remains low. We will increase rate and check another level.   Goal of Therapy:  Heparin level 0.3-0.7 units/ml Monitor platelets by anticoagulation protocol: Yes   Plan:  No bolus per surgery Increase heparin to 1550 units/hr Heparin level in 8hrs  Ulyses Southward, PharmD, BCIDP, AAHIVP, CPP Infectious Disease Pharmacist 05/03/2023 7:30 AM

## 2023-05-03 NOTE — Progress Notes (Signed)
Initial Nutrition Assessment  DOCUMENTATION CODES:   Non-severe (moderate) malnutrition in context of chronic illness  INTERVENTION:  Clinimix E 8/10 1L @ 42 mL/hr + SMOF lipids + D10 @ 42 mL/hr on MTWThFSa Clinimix E 8/19 2L @ 82 mL/hr + SMOF lipids on Sunday   Electrolytes in Clinimix E 8/10 1L bag: Na 35 mEq, K 30 mEq, Mag 5 mEq, Ca 5 mEq, Phos , Acetate 83, Cl 76 mEq Electrolytes in Clinimix E 8/10 2L bag: Na , K , Mag , Ca , Phos , Ac , Cl .    Continue standard MVI and trace elements to TPN Continue Moderate q4h SSI and adjust as needed  Continue to monitor for diet up advancement. Once diet upgraded implement Ensure Plus High Protein po BID, each supplement provides 350 kcal and 20 grams of protein.    NUTRITION DIAGNOSIS:   Moderate Malnutrition related to chronic illness as evidenced by mild fat depletion, mild muscle depletion, energy intake < 75% for > 7 days.    GOAL:   Patient will meet greater than or equal to 90% of their needs    MONITOR:   PO intake, Supplement acceptance, Diet advancement, Labs, Weight trends  REASON FOR ASSESSMENT:   Consult New TPN/TNA  ASSESSMENT:   77 y.o. F, admitted with PMH of hypothyroidism, COPD,OSA (on BiPAP), BOOP (noncandidate for stenting), diverticulitis, CHF, obesity, HTN, gout, and colovaginal fistula. Presented 11/14 for worsening diverticulitis with new bowel perforation. Patient resting with no dietary concerns at this time. MD reported during rounds that worsening leucocytosis-nauseous, GS planning repeat CT scan today. No diet advancement at this time.   Significant events: 11/14>> admit to [facility] 11/19>>exploratory laparotomy with sigmoid colectomy and end ostomy    Significant studies: 11/14>> CT abdomen/pelvis: Worsening signal right diverticulitis-localized perforation-5.6 cm perirectal abscess, 6.0 cm left lower quadrant abscess. 11/22>> x-ray abdomen: Consistent  with postoperative ileus.  NG vented.dual lumen 16 Fr. Low intermitted suctioning, , green.  JP RLQ(19Fr.)-59ml JP RUQ(19 Fr.)- 40ml Negative pressure wound therapy abdomen L lower medial- 20ml Colostomy 0 output  Hospital weight history:  05/02/23 0500 87.2 kg 192.24 lbs  04/30/23 0500 79.3 kg 174.83 lbs  04/29/23 0500 79.3 kg 174.83 lbs  04/27/23 1028 79.4 kg 175 lbs  04/26/23 0500 84 kg 185.19 lbs  04/25/23 0423 83.5 kg 184.08 lbs  04/24/23 0429 82.7 kg 182.32 lbs  04/22/23 1829 79.4 kg 175 lbs   NUTRITION - FOCUSED PHYSICAL EXAM:  Flowsheet Row Most Recent Value  Orbital Region Mild depletion  Upper Arm Region Mild depletion  Thoracic and Lumbar Region Unable to assess  [Fluid]  Buccal Region No depletion  Temple Region Moderate depletion  Clavicle Bone Region Unable to assess  [Fluid]  Clavicle and Acromion Bone Region Mild depletion  Scapular Bone Region Mild depletion  Dorsal Hand Mild depletion  Patellar Region Unable to assess  [Fluid]  Anterior Thigh Region Unable to assess  [Fluid]  Posterior Calf Region Unable to assess  [Fluid]  Edema (RD Assessment) Severe  [Abdomen and leg edema]  Hair Reviewed  Eyes Reviewed  Mouth Reviewed  Skin Reviewed  Nails Reviewed       Diet Order:   Diet Order             Diet NPO time specified Except for: Ice Chips  Diet effective now                   EDUCATION NEEDS:   Education needs have  been addressed  Skin:  Skin Assessment: Skin Integrity Issues: Skin Integrity Issues:: Wound VAC Wound Vac: abdomen Other: new colostomy (11/18)  Last BM:  11/18 (prior to colostomy), no stool yet via colostomy  Height:   Ht Readings from Last 1 Encounters:  04/27/23 5' (1.524 m)    Weight:   Wt Readings from Last 1 Encounters:  05/02/23 87.2 kg    Ideal Body Weight:  45.45 kg  BMI:  Body mass index is 37.54 kg/m.  Estimated Nutritional Needs:   Kcal:  1600-1800 kcal  Protein:  75-95  gm  Fluid:  >1.6L    Jamelle Haring RDN, LDN Clinical Dietitian  RDN pager # available on Amion

## 2023-05-03 NOTE — Consult Note (Addendum)
WOC Nurse follow-up consult Note: Changed the VAC system and pouch system.  Pt was medicated for pain prior to the procedure but was still in significant pain.  Full thickness midline post-op wound is beefy red, mod amt pink drainage in the cannister. Applied one piece black foam to cont suction.  WOC team will plan to change dressing again on Wednesday.    WOC Nurse ostomy follow up Colostomy pouch is in close proximity to the Vac dressing and both will need to be changed each time. Pt is in ICU and in pain and is not ready to learn at this time.  Daughter at the bedside, however she lives in [place] and will not be assisting with patient after discharge.  Stoma type/location: Stoma is red, swallowed and draining bloody efluent. Detachment 5 o`clock 2 cm. The stoma is viable, slightly above skin level, 1 1/2 inches.  No stool or flatus, small amt pink drainage in the pouch.  Applied barrier ring and 2 piece convex pouching system. 3 sets of each supply left at the bedside: Use Supplies: Barrier ring [phone] Pouch #649 Barrier #[id number]  Thank-you,  Denyse Amass BSN, RN, [employer], WOC  (Pager: [phone])

## 2023-05-03 NOTE — Care Management Important Message (Signed)
Important Message  Patient Details  Name: Kathleen [name] MRN: [id number] Date of Birth: 11-17-1945   Important Message Given:  Yes - Medicare IM     Dorena Bodo 05/03/2023, 3:38 PM

## 2023-05-03 NOTE — Progress Notes (Signed)
PHARMACY - TOTAL PARENTERAL NUTRITION CONSULT NOTE   Indication: Prolonged ileus  Patient Measurements: Height: 5' (152.4 cm) Weight: 87.2 kg (192 lb 3.9 oz) IBW/kg (Calculated) : 45.5 TPN AdjBW (KG): 54 Body mass index is 37.54 kg/m.   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 Patient has been NPO since surgery.   Glucose / Insulin: hx T2DM, A1c 6.2% this admit; no DM meds PTA; CBGs <180. 20u mSSI/24 hours. Receiving ~200g CHO from TPN Electrolytes: K 3.7 (~45 mEq K in Kphos ; goal >4), Phos 2.3 (s/p KPhos x1), Mg 2.2 (goal >2) Renal: SCr <1, BUN 23 Hepatic: AST 14/ALT 12, alk phos wnl, Tbili wnl; TG 184 Intake / Output; MIVF: UOP 0.6 ml/kg/hr. JP drains . LBM 11/18. Colostomy output - 0 mL; NG output 600 mL GI Imaging: 11/21 KUB moderately dilated loops of small bowel, c/w post-operative ileus 11/23 KUB: persistent small bowel obstruction or ileus, no significant change from prior 11/25 KUB: pending 11/25 CTAP: pending GI Surgeries / Procedures:  11/19 ex lap with sigmoid colectomy and end left colostomy  Central access: PICC placed 04/30/23 TPN start date: 04/30/23  Nutritional Goals: Clinimix E 8/10 1L at 42 ml/hr with SMOF lipids + D10 at 42 ml/hr on MTWThFSa Clinimix E 8/10 2L at 82 ml/hr on Sun - Average of 1534 kcals per day (~95% of goal) and 91g protein per day (100% goal)  Electrolytes in Clinimix E 8/10 1L bag: Na 35 mEq, K 30 mEq, Mag 5 mEq, Ca 5 mEq, Phos , Acetate 83, Cl 76 mEq Electrolytes in Clinimix E 8/10 2L bag: Na , K , Mag , Ca , Phos , Ac , Cl .   RD Assessment: Estimated Needs Total Energy Estimated Needs: 1600-1800 kcal Total Protein Estimated Needs: 75-95  gm Total Fluid Estimated Needs: >1.6L  Current Nutrition:  NPO except for ice chips  Plan:  Clinimix E 8/10 1L @ 42 mL/hr + SMOF lipids + D10 @ 42 mL/hr on MTWThFSa Clinimix E 8/19 2L @ 82 mL/hr + SMOF lipids on Sunday  Electrolytes in Clinimix E 8/10 1L bag: Na 35 mEq, K 30 mEq, Mag 5 mEq, Ca 5 mEq, Phos , Acetate 83, Cl 76 mEq Electrolytes in Clinimix E 8/10 2L bag: Na , K , Mag , Ca , Phos , Ac , Cl .    Continue standard MVI and trace elements to TPN Continue Moderate q4h SSI and adjust as needed  Monitor TPN labs daily on Clinimix.  Give Kphos 30 mmol IV x1 - switch base fluid to NS (will provide ~45 mEq K - goal K >4 on Tikosyn)  Rexford Maus, PharmD, BCPS 05/03/2023 8:51 AM

## 2023-05-04 ENCOUNTER — Inpatient Hospital Stay (HOSPITAL_COMMUNITY): Payer: Medicare Other

## 2023-05-04 DIAGNOSIS — E039 Hypothyroidism, unspecified: Secondary | ICD-10-CM | POA: Diagnosis not present

## 2023-05-04 DIAGNOSIS — J8489 Other specified interstitial pulmonary diseases: Secondary | ICD-10-CM | POA: Diagnosis not present

## 2023-05-04 DIAGNOSIS — K5720 Diverticulitis of large intestine with perforation and abscess without bleeding: Secondary | ICD-10-CM | POA: Diagnosis not present

## 2023-05-04 DIAGNOSIS — Z950 Presence of cardiac pacemaker: Secondary | ICD-10-CM | POA: Diagnosis not present

## 2023-05-04 LAB — COMPREHENSIVE METABOLIC PANEL
ALT: 11 U/L (ref 0–44)
AST: 15 U/L (ref 15–41)
Albumin: 2 g/dL — ABNORMAL LOW (ref 3.5–5.0)
Alkaline Phosphatase: 47 U/L (ref 38–126)
Anion gap: 8 (ref 5–15)
BUN: 21 mg/dL (ref 8–23)
CO2: 24 mmol/L (ref 22–32)
Calcium: 8.3 mg/dL — ABNORMAL LOW (ref 8.9–10.3)
Chloride: 100 mmol/L (ref 98–111)
Creatinine, Ser: 0.62 mg/dL (ref 0.44–1.00)
GFR, Estimated: >60 mL/min (ref >60)
Glucose, Bld: 191 mg/dL — ABNORMAL HIGH (ref 70–99)
Potassium: 3.5 mmol/L (ref 3.5–5.1)
Sodium: 132 mmol/L — ABNORMAL LOW (ref 135–145)
Total Bilirubin: 0.5 mg/dL (ref <1.2)
Total Protein: 5.1 g/dL — ABNORMAL LOW (ref 6.5–8.1)

## 2023-05-04 LAB — CBC
HCT: 26.4 % — ABNORMAL LOW (ref 36.0–46.0)
Hemoglobin: 7.9 g/dL — ABNORMAL LOW (ref 12.0–15.0)
MCH: 25.2 pg — ABNORMAL LOW (ref 26.0–34.0)
MCHC: 29.9 g/dL — ABNORMAL LOW (ref 30.0–36.0)
MCV: 84.3 fL (ref 80.0–100.0)
Platelets: 355 10*3/uL (ref 150–400)
RBC: 3.13 MIL/uL — ABNORMAL LOW (ref 3.87–5.11)
RDW: 15.9 % — ABNORMAL HIGH (ref 11.5–15.5)
WBC: 21.5 10*3/uL — ABNORMAL HIGH (ref 4.0–10.5)
nRBC: 0.4 % — ABNORMAL HIGH (ref 0.0–0.2)

## 2023-05-04 LAB — HEPARIN LEVEL (UNFRACTIONATED)
Heparin Unfractionated: 0.23 [IU]/mL — ABNORMAL LOW (ref 0.30–0.70)
Heparin Unfractionated: 0.44 [IU]/mL (ref 0.30–0.70)

## 2023-05-04 LAB — GLUCOSE, CAPILLARY
Glucose-Capillary: 112 mg/dL — ABNORMAL HIGH (ref 70–99)
Glucose-Capillary: 121 mg/dL — ABNORMAL HIGH (ref 70–99)
Glucose-Capillary: 132 mg/dL — ABNORMAL HIGH (ref 70–99)
Glucose-Capillary: 135 mg/dL — ABNORMAL HIGH (ref 70–99)
Glucose-Capillary: 148 mg/dL — ABNORMAL HIGH (ref 70–99)
Glucose-Capillary: 207 mg/dL — ABNORMAL HIGH (ref 70–99)

## 2023-05-04 LAB — MAGNESIUM: Magnesium: 2.3 mg/dL (ref 1.7–2.4)

## 2023-05-04 LAB — PHOSPHORUS: Phosphorus: 3.3 mg/dL (ref 2.5–4.6)

## 2023-05-04 MED ORDER — FUROSEMIDE 10 MG/ML IJ SOLN
20.0000 mg | Freq: Once | INTRAMUSCULAR | Status: AC
  Administered 2023-05-04: 20 mg via INTRAVENOUS
  Filled 2023-05-04: qty 2

## 2023-05-04 MED ORDER — LIDOCAINE 5 % EX PTCH
1.0000 | MEDICATED_PATCH | CUTANEOUS | Status: DC
  Administered 2023-05-04 – 2023-05-27 (×23): 1 via TRANSDERMAL
  Filled 2023-05-04 (×23): qty 1

## 2023-05-04 MED ORDER — FAT EMUL FISH OIL/PLANT BASED 20% (SMOFLIPID)IV EMUL
250.0000 mL | INTRAVENOUS | Status: AC
  Administered 2023-05-04: 250 mL via INTRAVENOUS
  Filled 2023-05-04: qty 250

## 2023-05-04 MED ORDER — POTASSIUM CHLORIDE 10 MEQ/100ML IV SOLN
10.0000 meq | INTRAVENOUS | Status: AC
  Administered 2023-05-04 (×4): 10 meq via INTRAVENOUS
  Filled 2023-05-04 (×4): qty 100

## 2023-05-04 MED ORDER — TRACE MINERALS CU-MN-SE-ZN 300-55-60-3000 MCG/ML IV SOLN
INTRAVENOUS | Status: AC
  Filled 2023-05-04 (×2): qty 1000

## 2023-05-04 MED ORDER — IPRATROPIUM-ALBUTEROL 0.5-2.5 (3) MG/3ML IN SOLN
3.0000 mL | Freq: Two times a day (BID) | RESPIRATORY_TRACT | Status: DC
  Administered 2023-05-04 – 2023-05-06 (×4): 3 mL via RESPIRATORY_TRACT
  Filled 2023-05-04 (×4): qty 3

## 2023-05-04 MED ORDER — ACETAMINOPHEN 10 MG/ML IV SOLN
1000.0000 mg | Freq: Four times a day (QID) | INTRAVENOUS | Status: AC
  Administered 2023-05-04 – 2023-05-05 (×4): 1000 mg via INTRAVENOUS
  Filled 2023-05-04 (×4): qty 100

## 2023-05-04 MED ORDER — IPRATROPIUM-ALBUTEROL 0.5-2.5 (3) MG/3ML IN SOLN: 3.0000 mL | Freq: Three times a day (TID) | RESPIRATORY_TRACT | Status: DC

## 2023-05-04 NOTE — Progress Notes (Signed)
   05/04/23 0021  BiPAP/CPAP/SIPAP  Reason BIPAP/CPAP not in use Non-compliant

## 2023-05-04 NOTE — Progress Notes (Signed)
Patient ID: ELYANNAH HENNE, female   DOB: [birth year], 77 y.o.   MRN: [id number] [facility] Surgery Progress Note  7 Days Post-Op  Subjective: CC-  Main complaint is back pain. Abdominal pain about the same. NG with 2L bilious output, no colostomy output. She is getting OOB multiple times daily. Vac failed over night. She also reports some pain with urination, as well as productive cough. WBC 21.5, afebrile. CT yesterday showed postop changes and ileus, no abscess.   Objective: Vital signs in last 24 hours: Temp:  [97.4 F (36.3 C)-98.1 F (36.7 C)] 97.6 F (36.4 C) (11/26 0734) Pulse Rate:  [71-82] 82 (11/26 0734) Resp:  [20-24] 24 (11/26 0734) BP: (127-133)/(54-73) 133/73 (11/26 0734) SpO2:  [95 %-99 %] 99 % (11/26 0827) Weight:  [90.7 kg] 90.7 kg (11/26 0500) Last BM Date : 04/26/23  Intake/Output from previous day: 11/25 0701 - 11/26 0700 In: 2689.2 [I.V.:1501.2; NG/GT:600; IV Piggyback:587.9] Out: 3540 [Urine:1450; Emesis/NG output:2000; Drains:90] Intake/Output this shift: No intake/output data recorded.  PE: Gen:  Alert, NAD Abd: soft, mild distension, generalized tenderness, drain x2 serosanguinous, ostomy pale pink with no stool in bag, open midline pale pink with no erythema or purulent drainage/ fascia intact  Lab Results:  Recent Labs    05/03/23 0604 05/04/23 0448  WBC 23.3* 21.5*  HGB 8.8* 7.9*  HCT 28.5* 26.4*  PLT 388 355   BMET Recent Labs    05/03/23 0604 05/04/23 0448  NA 133* 132*  K 3.7 3.5  CL 99 100  CO2 24 24  GLUCOSE 219* 191*  BUN 23 21  CREATININE 0.78 0.62  CALCIUM 8.3* 8.3*   PT/INR No results for input(s): "LABPROT", "INR" in the last 72 hours. CMP     Component Value Date/Time   NA 132 (L) 05/04/2023 0448   NA 130 (L) 12/28/2012 0748   K 3.5 05/04/2023 0448   K 4.6 12/28/2012 0748   CL 100 05/04/2023 0448   CL 95 (L) 12/28/2012 0748   CO2 24 05/04/2023 0448   CO2 23 12/28/2012 0748   GLUCOSE 191 (H) 05/04/2023  0448   GLUCOSE 391 (H) 12/28/2012 0748   BUN 21 05/04/2023 0448   BUN 32 (H) 12/28/2012 0748   CREATININE 0.62 05/04/2023 0448   CREATININE 0.86 01/27/2023 1137   CALCIUM 8.3 (L) 05/04/2023 0448   CALCIUM 14.3 (HH) 06/26/2019 1618   PROT 5.1 (L) 05/04/2023 0448   ALBUMIN 2.0 (L) 05/04/2023 0448   AST 15 05/04/2023 0448   ALT 11 05/04/2023 0448   ALKPHOS 47 05/04/2023 0448   BILITOT 0.5 05/04/2023 0448   GFRNONAA >60 05/04/2023 0448   GFRNONAA 43 (L) 12/28/2012 0748   GFRAA 58 (L) 06/28/2019 0427   GFRAA 50 (L) 12/28/2012 0748   Lipase     Component Value Date/Time   LIPASE 30 04/22/2023 1840       Studies/Results: CT ABDOMEN PELVIS W CONTRAST  Result Date: 05/03/2023 CLINICAL DATA:  Abdominal pain, postop EXAM: CT ABDOMEN AND PELVIS WITH CONTRAST TECHNIQUE: Multidetector CT imaging of the abdomen and pelvis was performed using the standard protocol following bolus administration of intravenous contrast. RADIATION DOSE REDUCTION: This exam was performed according to the departmental dose-optimization program which includes automated exposure control, adjustment of the mA and/or kV according to patient size and/or use of iterative reconstruction technique. CONTRAST:  75mL OMNIPAQUE IOHEXOL 350 MG/ML SOLN COMPARISON:  04/22/2023 FINDINGS: Lower chest: Cardiomegaly. Pacer wires in the right heart. Trace left pleural effusion.  Bibasilar airspace opacities most compatible with atelectasis. Hepatobiliary: Prior cholecystectomy. Scattered subpleural hypodensities in the liver are stable since prior study most compatible with cysts. No suspicious hepatic abnormality or biliary ductal dilatation. Pancreas: No focal abnormality or ductal dilatation. Spleen: No focal abnormality.  Normal size. Adrenals/Urinary Tract: Adrenal glands normal. Simple cyst in the lower pole of the left kidney measures 2.8 cm. No follow-up imaging recommended. No stones or hydronephrosis. Urinary bladder grossly  unremarkable. Stomach/Bowel: Postoperative changes with left lower quadrant ostomy and Hartmann's pouch. NG tube is in the stomach. Stomach and small bowel mildly dilated into the pelvis. This could reflect postoperative ileus or small-bowel obstruction. Vascular/Lymphatic: Aortic atherosclerosis. No evidence of aneurysm or adenopathy. Reproductive: Prior hysterectomy.  No adnexal masses. Other: Stranding in the pelvis around the Hartmann's pouch and remaining sigmoid colon, similar to prior study. No drainable focal fluid collection. No free air. Open midline incision. Musculoskeletal: No acute bony abnormality. IMPRESSION: Postoperative changes with left lower quadrant ostomy. Stomach is mildly dilated with small bowel dilated into the pelvis. Pelvic small bowel loops are decompressed. This could reflect postoperative ileus or distal small bowel obstruction. Stranding around the Hartmann's pouch and sigmoid colon, similar to preoperative study. This could reflect residual inflammation or postoperative changes. No drainable fluid collection. Trace left pleural effusion.  Bibasilar atelectasis. Electronically Signed   By: Charlett Nose M.D.   On: 05/03/2023 17:13   DG Abd Portable 1V  Result Date: 05/03/2023 CLINICAL DATA:  77 year old female enteric tube placement. Postoperative day 6 status post exploratory laparotomy with sigmoid colectomy. EXAM: PORTABLE ABDOMEN - 1 VIEW COMPARISON:  05/01/2023 and earlier. FINDINGS: Portable AP supine view at 0850 hours. The enteric tube has been pulled back, side hole is now at the level of the esophagus. The tube tip is at the GEJ. Stable cholecystectomy clips. Continued gas-filled and dilated small bowel loops in the visible abdomen. Continued featureless appearance large bowel gas in the left abdomen. Left lung base opacity. Cardiac pacer leads. IMPRESSION: 1. Enteric tube has been pulled back with tip now at the GEJ. Advance 8 cm to ensure side hole placement within  the stomach. 2. Persistent abnormal visible bowel-gas pattern compatible with bowel obstruction or ileus. 3. Left lung base opacity, consider pleural effusion and atelectasis. Electronically Signed   By: Odessa Fleming M.D.   On: 05/03/2023 09:26    Anti-infectives: Anti-infectives (From admission, onward)    Start     Dose/Rate Route Frequency Ordered Stop   05/03/23 1400  piperacillin-tazobactam (ZOSYN) IVPB 3.375 g        3.375 g 12.5 mL/hr over 240 Minutes Intravenous Every 8 hours 05/03/23 0935     04/27/23 1021  sodium chloride 0.9 % with cefoTEtan (CEFOTAN) ADS Med       Note to Pharmacy: Shanda Bumps M: cabinet override      04/27/23 1021 04/27/23 1253   04/26/23 0800  cefoTEtan (CEFOTAN) 2 g in sodium chloride 0.9 % 100 mL IVPB  Status:  Discontinued        2 g 200 mL/hr over 30 Minutes Intravenous To ShortStay Surgical 04/23/23 2111 04/27/23 0800   04/22/23 2300  piperacillin-tazobactam (ZOSYN) IVPB 3.375 g        3.375 g 12.5 mL/hr over 240 Minutes Intravenous Every 8 hours 04/22/23 2256 05/02/23 2359   04/22/23 1900  piperacillin-tazobactam (ZOSYN) IVPB 3.375 g        3.375 g 100 mL/hr over 30 Minutes Intravenous  Once 04/22/23 1852 04/22/23  1954        Assessment/Plan POD #7 elap, sigmoid colectomy, colostomy 11/19- MW - CT 11/25 with ileus, no abscess - Continue NPO/NGT to LIWS and await return in bowel function - oob, pulm toilet - WOC to replace Vac again today. Also following for new ostomy. Will need ostomy clinic referral at discharge - Continue PICC/TPN 11/22 --> - continue JP drain x2 - currently serosanguinous - discussed further leukocytosis w/u with primary team, will start with CXR. Patient does not need any more antibiotics at this point from surgical standpoint - K pad and lidocaine patches for back pain - continue IV tylenol   ID zosyn 11/14>> VTE - heparin heparin gtt for afib  FEN - TPN, NPO/NGT to LIWS    LOS: 12 days    Franne Forts,  [facility] Surgery 05/04/2023, 9:09 AM Please see Amion for pager number during day hours 7:00am-4:30pm

## 2023-05-04 NOTE — Progress Notes (Signed)
PHARMACY - ANTICOAGULATION CONSULT NOTE  Pharmacy Consult for Heparin gtt Indication: atrial fibrillation  Patient Measurements: Height: 5' (152.4 cm) Weight: 90.7 kg (199 lb 15.3 oz) IBW/kg (Calculated) : 45.5 Heparin Dosing Weight: 64 kg  Vital Signs: Temp: 97.9 F (36.6 C) (11/26 1602) Temp Source: Oral (11/26 1602) BP: 106/58 (11/26 1602) Pulse Rate: 71 (11/26 1602)  Labs: Recent Labs    05/02/23 0359 05/02/23 1301 05/02/23 2110 05/03/23 0604 05/03/23 1721 05/04/23 0448 05/04/23 1454  HGB 8.9*  --   --  8.8*  --  7.9*  --   HCT 29.8*  --   --  28.5*  --  26.4*  --   PLT 412*  --   --  388  --  355  --   APTT  --   --  43*  --   --   --   --   HEPARINUNFRC 0.20*   < > 0.22* 0.19* 0.21* 0.23* 0.44  CREATININE 0.64  --   --  0.78  --  0.62  --   TROPONINIHS  --   --   --  5  --   --   --    < > = values in this interval not displayed.   Estimated Creatinine Clearance: 60.1 mL/min (by C-G formula based on SCr of 0.62 mg/dL).  Assessment: [name] with history of atrial fibrillation on Xarelto prior to admission. Last dose of Xarelto 04/21/23 at 1900. Admitted for recurrent diverticulitis, perforation, and perirectal abscess. 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 Cleared by surgery to start hep gtt for afib.  Heparin level is therapeutic at 0.44 on heparin 1900 units/hr. No problems with the infusion or bleeding reported per RN.  Goal of Therapy:  Heparin level 0.3-0.7 units/ml Monitor platelets by anticoagulation protocol: Yes   Plan:  No bolus per surgery Continue heparin at 1900 units/hr Heparin level in 8hrs CBC daily  Monitor for signs/symptoms of bleeding  Cedric Fishman, PharmD, BCPS, BCCCP Clinical Pharmacist

## 2023-05-04 NOTE — Consult Note (Addendum)
WOC Nurse wound follow up Current Vac was leaking.  Pt was medicated for pain prior to the procedure and tolerated with mod amt discomfort.  Ostomy pouch is intact with good seal, no stool or flatus. Applied one piece black foam to cont suction.  Used barrier strip to outer edge near ostomy pouch to attempt to maintain a seal.  Wound remains beefy red. WOC team will change Vac again on Fri. Thank-you,  Cammie Mcgee MSN, RN, CWOCN, [place], CNS [phone]

## 2023-05-04 NOTE — Progress Notes (Signed)
PHARMACY - ANTICOAGULATION CONSULT NOTE  Pharmacy Consult for Heparin gtt Indication: atrial fibrillation  Patient Measurements: Height: 5' (152.4 cm) Weight: 90.7 kg (199 lb 15.3 oz) IBW/kg (Calculated) : 45.5 Heparin Dosing Weight: 64 kg  Vital Signs: Temp: 97.4 F (36.3 C) (11/26 0400) Temp Source: Oral (11/26 0400) BP: 127/55 (11/26 0000) Pulse Rate: 79 (11/26 0021)  Labs: Recent Labs    05/02/23 0359 05/02/23 1301 05/02/23 2110 05/03/23 0604 05/03/23 1721 05/04/23 0448  HGB 8.9*  --   --  8.8*  --  7.9*  HCT 29.8*  --   --  28.5*  --  26.4*  PLT 412*  --   --  388  --  355  APTT  --   --  43*  --   --   --   HEPARINUNFRC 0.20*   < > 0.22* 0.19* 0.21* 0.23*  CREATININE 0.64  --   --  0.78  --   --   TROPONINIHS  --   --   --  5  --   --    < > = values in this interval not displayed.   Estimated Creatinine Clearance: 60.1 mL/min (by C-G formula based on SCr of 0.78 mg/dL).  Assessment: [name] with history of atrial fibrillation on Xarelto prior to admission. Last dose of Xarelto 04/21/23 at 1900. Admitted for recurrent diverticulitis, perforation, and perirectal abscess. 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 Cleared by surgery to start hep gtt for afib.  Heparin level is still subtherapeutic 0.23 on heparin 1700 units/hr. No problems with the infusion or bleeding reported per RN.  Goal of Therapy:  Heparin level 0.3-0.7 units/ml Monitor platelets by anticoagulation protocol: Yes   Plan:  No bolus per surgery Increase heparin to 1900 units/hr Heparin level in 8hrs CBC daily  Monitor for signs/symptoms of bleeding   Arabella Merles, PharmD. Clinical Pharmacist 05/04/2023 5:51 AM

## 2023-05-04 NOTE — Progress Notes (Addendum)
PHARMACY - TOTAL PARENTERAL NUTRITION CONSULT NOTE   Indication: Prolonged ileus  Patient Measurements: Height: 5' (152.4 cm) Weight: 90.7 kg (199 lb 15.3 oz) IBW/kg (Calculated) : 45.5 TPN AdjBW (KG): 54 Body mass index is 39.05 kg/m. 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 Patient has been NPO since surgery.   Glucose / Insulin: hx T2DM, A1c 6.2% this admit; no DM meds PTA; CBGs <200. 17u mSSI/24 hours. Receiving ~200g CHO from TPN + D10 gtt Electrolytes: K 3.5 (~45 mEq K in Kphos ; goal >4), Phos 3.3 (s/p KPhos x1), Mg 2.3 (goal >2), CoCa 9.9 Renal: SCr <1, BUN 23 Hepatic: LFTs wnl, alk phos wnl, Tbili wnl; TG 184 Intake / Output; MIVF: UOP 0.7 ml/kg/hr. JP drains 90mL. LBM 11/18. Colostomy output - 0 mL; NG output 2000 mL; net neg 2L, non-pitting edema noted GI Imaging: 11/21 KUB moderately dilated loops of small bowel, c/w post-operative ileus 11/23 KUB: persistent small bowel obstruction or ileus, no significant change from prior 11/25 KUB: bowel obstruction or ileus, pleural effusion vs atelectasis 11/25 CTAP: post-op ileus or distal SBO, trace L pleural effusion 11/26 CXR: L > R lower lobe atelectasis GI Surgeries / Procedures:  11/19 ex lap with sigmoid colectomy and end left colostomy  Central access: PICC placed 04/30/23 TPN start date: 04/30/23  Nutritional Goals: Clinimix E 8/10 1L at 42 ml/hr with SMOF lipids on MTWThFSa Clinimix E 8/10 2L at 82 ml/hr on Sun - Average of 1258 kcals per day (~80% of goal) and 91g protein per day (100% goal)  Electrolytes in Clinimix E 8/10 1L bag: Na 35 mEq, K 30 mEq, Mag 5 mEq, Ca 5 mEq, Phos , Acetate 83, Cl 76  mEq Electrolytes in Clinimix E 8/10 2L bag: Na , K , Mag , Ca , Phos , Ac , Cl .   RD Assessment: Estimated Needs Total Energy Estimated Needs: 1600-1800 kcal Total Protein Estimated Needs: 75-95 gm Total Fluid Estimated Needs: >1.6L  Current Nutrition:  NPO except for ice chips  Plan:  Given concern for pleural effusions/atelectasis noted on CTAP and KUB, plus increase in weight trends, will remove D10 fluids >> still meeting ~80% of kcal needs without. Plan d/w MD. Can consider increasing Clinimix 2L bag to 2 days/week.  Clinimix E 8/10 1L @ 42 mL/hr + SMOF lipids on MTWThFSa Clinimix E 8/19 2L @ 82 mL/hr + SMOF lipids on Sunday  Electrolytes in Clinimix E 8/10 1L bag: Na 35 mEq, K 30 mEq, Mag 5 mEq, Ca 5 mEq, Phos , Acetate 83, Cl 76 mEq Electrolytes in Clinimix E 8/10 2L bag: Na , K , Mag , Ca , Phos , Ac , Cl .    Continue standard MVI and trace elements to TPN Continue Moderate q4h SSI and adjust as needed - not escalating today since stopping D10 fluids Monitor TPN labs daily on Clinimix.  Watch volume status and weight trends   Kcl 10 mEq IV x4 runs (goal K >4 on Tikosyn)  Rexford Maus, PharmD, BCPS 05/04/2023 8:55 AM

## 2023-05-04 NOTE — Progress Notes (Signed)
PROGRESS NOTE        PATIENT DETAILS Name: Kathleen [name] Age: 77 y.o. Sex: female Date of Birth: 06-07-1946 Admit Date: 04/22/2023 Admitting Physician Gery Pray, MD [id number]:[id number], Bernadene Bell, MD  Brief Summary: Patient is a 77 y.o.  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  Admitted by [facility]-started on empiric IV antibiotics-underwent preop assessment by cardiology-and then subsequently underwent  exploratory laparotomy with sigmoid colectomy and end ostomy on 11/19.  Postoperative course complicated by ileus-requiring NG tube decompression-and TNA for nutrition.  See below for further details.  Significant events: 11/14>> admit to [facility] 11/19>>exploratory laparotomy with sigmoid colectomy and end ostomy   Significant studies: 11/14>> CT abdomen/pelvis: Worsening signal right diverticulitis-localized perforation-5.6 cm perirectal abscess, 6.0 cm left lower quadrant abscess. 11/22>> x-ray abdomen: Consistent with postoperative ileus. 11/25>> CT abdomen: No abscess  Significant microbiology data: 11/14>> blood culture: No growth 11/14>> COVID/influenza/RSV PCR: Negative  Procedures: 11/19>>exploratory laparotomy with sigmoid colectomy  end ostomy on 11/19  Consults: Cardiology GI General surgery  Subjective: Coughing-but no major issues.  Some nausea.  No vomiting.  No output in ostomy as of yet.  Objective: Vitals: Blood pressure 133/73, pulse 82, temperature 97.6 F (36.4 C), temperature source Oral, resp. rate (!) 24, height 5' (1.524 m), weight 90.7 kg, SpO2 99%.   Exam: Gen Exam:Alert awake-not in any distress HEENT:atraumatic, normocephalic Chest: Few bibasilar rales but mostly clear. 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 Mobilize with PT/OT TNA for pharmacy General Surgery continues to follow  Hypokalemia/hypophosphatemia Repleted  Normocytic anemia Due to critical illness-drop in Hb overnight but no evidence of blood loss noted Continue to follow CBC-transfuse if significant drop.  Persistent atrial fibrillation Telemetry monitoring Tikosyn IV heparin restarted 11/23-some mild drop in Hb but no overt GI bleeding noted. Attempt to keep K> 4  History of AV nodal ablation-s/p PPM ([employer]) Telemetry monitoring  Chronic HFrEF (EF 35-40% on 4/9 by TTE) Volume status stable Aldactone on hold-resume when oral intake is stable  Chronic hypoxic respiratory failure on home O2 COPD BOOP Stable Pulmonary tolerating with incentive spirometry/flutter valve Mobilization with PT/OT Continue bronchodilators Incentive spirometry/flutter valve Out of bed  to chair.  GERD PPI  DM-2 (A1c 6.2 on 11/15) CBGs stable on SSI  Recent  Labs    05/03/23 2359 05/04/23 0351 05/04/23 0733  GLUCAP 132* 207* 121*   Nutrition Status: Nutrition Problem: Moderate Malnutrition Etiology: chronic illness Signs/Symptoms: mild fat depletion, mild muscle depletion, energy intake < 75% for > 7 days Interventions: Ensure Enlive (each supplement provides 350kcal and 20 grams of protein), Magic cup, MVI  Obesity: Estimated body mass index is 39.05 kg/m as calculated from the following:   Height as of this encounter: 5' (1.524 m).   Weight as of this encounter: 90.7 kg.   Code status:   Code Status: Limited: Do not attempt resuscitation (DNR) -DNR-LIMITED -Do Not Intubate/DNI    DVT Prophylaxis: IV heparin   Family Communication: Son at bedside   Disposition Plan: Status is: Inpatient Remains inpatient appropriate because: Severity of illness   Planned Discharge Destination:Skilled nursing facility   Diet: Diet Order             Diet NPO time specified Except for: Ice Chips  Diet effective now                     Antimicrobial agents: Anti-infectives (From admission, onward)    Start     Dose/Rate Route Frequency Ordered Stop   05/03/23 1400  piperacillin-tazobactam (ZOSYN) IVPB 3.375 g        3.375 g 12.5 mL/hr over 240 Minutes Intravenous Every 8 hours 05/03/23 0935     04/27/23 1021  sodium chloride 0.9 % with cefoTEtan (CEFOTAN) ADS Med       Note to Pharmacy: Shanda Bumps M: cabinet override      04/27/23 1021 04/27/23 1253   04/26/23 0800  cefoTEtan (CEFOTAN) 2 g in sodium chloride 0.9 % 100 mL IVPB  Status:  Discontinued        2 g 200 mL/hr over 30 Minutes Intravenous To [facility] 04/23/23 2111 04/27/23 0800   04/22/23 2300  piperacillin-tazobactam (ZOSYN) IVPB 3.375 g        3.375 g 12.5 mL/hr over 240 Minutes Intravenous Every 8 hours 04/22/23 2256 05/02/23 2359   04/22/23 1900  piperacillin-tazobactam (ZOSYN) IVPB 3.375 g        3.375 g 100 mL/hr over 30 Minutes Intravenous   Once 04/22/23 1852 04/22/23 1954        MEDICATIONS: Scheduled Meds:  Chlorhexidine Gluconate Cloth  6 each Topical Daily   dofetilide  250 mcg Oral Q12H   fluticasone furoate-vilanterol  1 puff Inhalation Daily   insulin aspart  0-15 Units Subcutaneous Q4H   lidocaine  1 patch Transdermal Q24H   methocarbamol (ROBAXIN) injection  500 mg Intravenous Q6H   mouth rinse  15 mL Mouth Rinse 4 times per day   pantoprazole (PROTONIX) IV  40 mg Intravenous Q12H   revefenacin  175 mcg Nebulization Daily   Continuous Infusions:  acetaminophen     dextrose 42 mL/hr at 05/03/23 1900   heparin 1,900 Units/hr (05/04/23 0558)   piperacillin-tazobactam (ZOSYN)  IV 3.375 g (05/04/23 0518)   potassium chloride     promethazine (PHENERGAN) injection (IM or IVPB) Stopped (05/02/23 1900)   TPN (CLINIMIX-E) Adult 42 mL/hr at 05/03/23 1800   PRN Meds:.albuterol, guaiFENesin-dextromethorphan, HYDROmorphone (DILAUDID) injection, ondansetron (ZOFRAN) IV, promethazine (PHENERGAN) injection (IM or IVPB), sodium chloride flush   I have personally reviewed following labs and imaging studies  LABORATORY DATA: CBC: Recent Labs  Lab 04/28/23 0442 04/29/23  0448 04/30/23 0639 05/01/23 0440 05/02/23 0359 05/03/23 0604 05/04/23 0448  WBC 18.7*   < > 18.9* 22.3* 18.1* 23.3* 21.5*  NEUTROABS 16.9*  --   --   --   --   --   --   HGB 9.3*   < > 8.5* 8.9* 8.9* 8.8* 7.9*  HCT 29.4*   < > 27.6* 29.7* 29.8* 28.5* 26.4*  MCV 81.9   < > 83.6 84.4 85.9 85.1 84.3  PLT 333   < > 374 444* 412* 388 355   < > = values in this interval not displayed.    Basic Metabolic Panel: Recent Labs  Lab 04/30/23 0639 05/01/23 0440 05/02/23 0359 05/03/23 0604 05/04/23 0448  NA 136 136 136 133* 132*  K 4.4 3.4* 3.3* 3.7 3.5  CL 100 100 101 99 100  CO2 26 26 25 24 24   GLUCOSE 143* 195* 171* 219* 191*  BUN 15 16 18 23 21   CREATININE 0.83 0.62 0.64 0.78 0.62  CALCIUM 7.7* 7.9* 8.0* 8.3* 8.3*  MG 2.4 2.4 2.4 2.2 2.3   PHOS 1.9* 2.4* 2.1* 2.3* 3.3    GFR: Estimated Creatinine Clearance: 60.1 mL/min (by C-G formula based on SCr of 0.62 mg/dL).  Liver Function Tests: Recent Labs  Lab 05/01/23 0440 05/02/23 0359 05/03/23 0604 05/04/23 0448  AST 10* 12* 14* 15  ALT 13 11 12 11   ALKPHOS 54 47 44 47  BILITOT 0.4 0.4 0.4 0.5  PROT 5.1* 5.2* 5.2* 5.1*  ALBUMIN 2.0* 2.1* 2.1* 2.0*   No results for input(s): "LIPASE", "AMYLASE" in the last 168 hours. No results for input(s): "AMMONIA" in the last 168 hours.  Coagulation Profile: No results for input(s): "INR", "PROTIME" in the last 168 hours.  Cardiac Enzymes: No results for input(s): "CKTOTAL", "CKMB", "CKMBINDEX", "TROPONINI" in the last 168 hours.  BNP (last 3 results) No results for input(s): "PROBNP" in the last 8760 hours.  Lipid Profile: Recent Labs    05/03/23 0604  TRIG 184*    Thyroid Function Tests: No results for input(s): "TSH", "T4TOTAL", "FREET4", "T3FREE", "THYROIDAB" in the last 72 hours.  Anemia Panel: No results for input(s): "VITAMINB12", "FOLATE", "FERRITIN", "TIBC", "IRON", "RETICCTPCT" in the last 72 hours.  Urine analysis:    Component Value Date/Time   COLORURINE AMBER (A) 04/23/2023 0623   APPEARANCEUR CLOUDY (A) 04/23/2023 0623   APPEARANCEUR Hazy 12/25/2012 1653   LABSPEC 1.024 04/23/2023 0623   LABSPEC 1.021 12/25/2012 1653   PHURINE 5.0 04/23/2023 0623   GLUCOSEU NEGATIVE 04/23/2023 0623   GLUCOSEU NEGATIVE 03/17/2023 1003   HGBUR MODERATE (A) 04/23/2023 0623   BILIRUBINUR NEGATIVE 04/23/2023 0623   BILIRUBINUR Negative 12/25/2012 1653   KETONESUR NEGATIVE 04/23/2023 0623   PROTEINUR 30 (A) 04/23/2023 0623   UROBILINOGEN 0.2 03/17/2023 1003   NITRITE NEGATIVE 04/23/2023 0623   LEUKOCYTESUR LARGE (A) 04/23/2023 0623   LEUKOCYTESUR Negative 12/25/2012 1653    Sepsis Labs: Lactic Acid, Venous    Component Value Date/Time   LATICACIDVEN 0.8 04/23/2023 0114    MICROBIOLOGY: Recent Results  (from the past 240 hour(s))  Surgical PCR screen     Status: None   Collection Time: 04/27/23 12:21 AM   Specimen: Nasal Mucosa; Nasal Swab  Result Value Ref Range Status   MRSA, PCR NEGATIVE NEGATIVE Final   Staphylococcus aureus NEGATIVE NEGATIVE Final    Comment: (NOTE) The Xpert SA Assay (FDA approved for NASAL specimens in patients 57 years of age and older), is one component  of a comprehensive surveillance program. It is not intended to diagnose infection nor to guide or monitor treatment. Performed at [facility], [address], [place], [place] [address]     RADIOLOGY STUDIES/RESULTS: CT ABDOMEN PELVIS W CONTRAST  Result Date: 05/03/2023 CLINICAL DATA:  Abdominal pain, postop EXAM: CT ABDOMEN AND PELVIS WITH CONTRAST TECHNIQUE: Multidetector CT imaging of the abdomen and pelvis was performed using the standard protocol following bolus administration of intravenous contrast. RADIATION DOSE REDUCTION: This exam was performed according to the departmental dose-optimization program which includes automated exposure control, adjustment of the mA and/or kV according to patient size and/or use of iterative reconstruction technique. CONTRAST:  75mL OMNIPAQUE IOHEXOL 350 MG/ML SOLN COMPARISON:  04/22/2023 FINDINGS: Lower chest: Cardiomegaly. Pacer wires in the right heart. Trace left pleural effusion. Bibasilar airspace opacities most compatible with atelectasis. Hepatobiliary: Prior cholecystectomy. Scattered subpleural hypodensities in the liver are stable since prior study most compatible with cysts. No suspicious hepatic abnormality or biliary ductal dilatation. Pancreas: No focal abnormality or ductal dilatation. Spleen: No focal abnormality.  Normal size. Adrenals/Urinary Tract: Adrenal glands normal. Simple cyst in the lower pole of the left kidney measures 2.8 cm. No follow-up imaging recommended. No stones or hydronephrosis. Urinary bladder grossly unremarkable. Stomach/Bowel:  Postoperative changes with left lower quadrant ostomy and Hartmann's pouch. NG tube is in the stomach. Stomach and small bowel mildly dilated into the pelvis. This could reflect postoperative ileus or small-bowel obstruction. Vascular/Lymphatic: Aortic atherosclerosis. No evidence of aneurysm or adenopathy. Reproductive: Prior hysterectomy.  No adnexal masses. Other: Stranding in the pelvis around the Hartmann's pouch and remaining sigmoid colon, similar to prior study. No drainable focal fluid collection. No free air. Open midline incision. Musculoskeletal: No acute bony abnormality. IMPRESSION: Postoperative changes with left lower quadrant ostomy. Stomach is mildly dilated with small bowel dilated into the pelvis. Pelvic small bowel loops are decompressed. This could reflect postoperative ileus or distal small bowel obstruction. Stranding around the Hartmann's pouch and sigmoid colon, similar to preoperative study. This could reflect residual inflammation or postoperative changes. No drainable fluid collection. Trace left pleural effusion.  Bibasilar atelectasis. Electronically Signed   By: Charlett Nose M.D.   On: 05/03/2023 17:13   DG Abd Portable 1V  Result Date: 05/03/2023 CLINICAL DATA:  77 year old female enteric tube placement. Postoperative day 6 status post exploratory laparotomy with sigmoid colectomy. EXAM: PORTABLE ABDOMEN - 1 VIEW COMPARISON:  05/01/2023 and earlier. FINDINGS: Portable AP supine view at 0850 hours. The enteric tube has been pulled back, side hole is now at the level of the esophagus. The tube tip is at the GEJ. Stable cholecystectomy clips. Continued gas-filled and dilated small bowel loops in the visible abdomen. Continued featureless appearance large bowel gas in the left abdomen. Left lung base opacity. Cardiac pacer leads. IMPRESSION: 1. Enteric tube has been pulled back with tip now at the GEJ. Advance 8 cm to ensure side hole placement within the stomach. 2. Persistent  abnormal visible bowel-gas pattern compatible with bowel obstruction or ileus. 3. Left lung base opacity, consider pleural effusion and atelectasis. Electronically Signed   By: Odessa Fleming M.D.   On: 05/03/2023 09:26     LOS: 12 days   Jeoffrey Massed, MD  [facility]    To contact the attending provider between 7A-7P or the covering provider during after hours 7P-7A, please log into the web site [email or web address] and access using universal [place] password for that web site. If you do not have  the password, please call the hospital operator.  05/04/2023, 9:34 AM

## 2023-05-04 NOTE — Progress Notes (Signed)
Patient is alert, awake . Pain is controled better with IV tylenol and IV Dilaudid. Wound vac did not working. Wet dry dressing applied, dry , intact. PT had severe SOB after moving from bedside commode to bed, oxygen sat dropped to 89 on 4 LNC, PT complained can not breath . Oxygen  sat increased to 96 with 5-6 LNC. PT received breathing treatment and rest without respiratory distress.

## 2023-05-04 NOTE — Progress Notes (Signed)
Physical Therapy Treatment Patient Details Name: Kathleen [name] MRN: [id number] DOB: 27-Apr-1946 Today's Date: 05/04/2023   History of Present Illness 77 y.o. 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 S/p exploratory laparotomy with sigmoid colectomy and end left colostomy by Dr. Dwain Sarna on 04/27/2023.  PMHx: colovaginal fistula associated with diverticulitis, obesity, OSA , BOOP, tracheobronchomalacia, chronic respiratory failure on 4 to 5 L oxygen at baseline, persistent A-fib S/P pacemaker implant 2017, chronic systolic heart failure, recent admission from 04/09/2023 to 04/13/2023.    PT Comments  Pt received in supine and agreeable to session. Pt requests to use the [facility] at the beginning of the session and is able to void, but requires assist with pericare. Pt declines use of RW for transfers and requests HHA requiring CGA-min A for stability. Pt requires UE support during transfers for balance with pt either using HHA or holding BSC/recliner armrests. Further mobility limited by increased pain. Pt continues to benefit from PT services to progress toward functional mobility goals.    If plan is discharge home, recommend the following: A lot of help with walking and/or transfers;A lot of help with bathing/dressing/bathroom;Assistance with cooking/housework;Assist for transportation   Can travel by private vehicle     No  Equipment Recommendations  None recommended by PT    Recommendations for Other Services       Precautions / Restrictions Precautions Precautions: Fall Precaution Comments: multiple drains, NGT, wound vac Required Braces or Orthoses: Other Brace Other Brace: Abdominal binder Restrictions Weight Bearing Restrictions: No     Mobility  Bed Mobility Overal bed mobility: Modified Independent             General bed mobility comments: increased time    Transfers Overall transfer level: Needs assistance Equipment  used: 1 person hand held assist Transfers: Sit to/from Stand, Bed to chair/wheelchair/BSC Sit to Stand: Contact guard assist   Step pivot transfers: Contact guard assist, Min assist       General transfer comment: Pt able to stand and pivot to [facility] and then to the recliner with min A-CGA via HHA due to pt declining use of RW.      Balance Overall balance assessment: Needs assistance Sitting-balance support: Single extremity supported, Feet supported Sitting balance-Leahy Scale: Good Sitting balance - Comments: sitting EOB   Standing balance support: Single extremity supported, During functional activity Standing balance-Leahy Scale: Fair Standing balance comment: requires UE support for stability                            Cognition Arousal: Alert Behavior During Therapy: WFL for tasks assessed/performed Overall Cognitive Status: Within Functional Limits for tasks assessed                                          Exercises      General Comments        Pertinent Vitals/Pain Pain Assessment Pain Assessment: 0-10 Pain Score: 8  Pain Location: Back and abdomen Pain Descriptors / Indicators: Grimacing, Discomfort Pain Intervention(s): Monitored during session, Limited activity within patient's tolerance, Repositioned     PT Goals (current goals can now be found in the care plan section) Acute Rehab PT Goals Patient Stated Goal: Reduce pain PT Goal Formulation: With patient Time For Goal Achievement: 05/12/23 Progress towards PT goals: Progressing toward goals  Frequency    Min 1X/week       AM-PAC PT "6 Clicks" Mobility   Outcome Measure  Help needed turning from your back to your side while in a flat bed without using bedrails?: None Help needed moving from lying on your back to sitting on the side of a flat bed without using bedrails?: A Little Help needed moving to and from a bed to a chair (including a wheelchair)?: A  Little Help needed standing up from a chair using your arms (e.g., wheelchair or bedside chair)?: A Little Help needed to walk in hospital room?: A Lot Help needed climbing 3-5 steps with a railing? : Total 6 Click Score: 16    End of Session Equipment Utilized During Treatment: Gait belt;Oxygen Activity Tolerance: Patient limited by pain;Patient limited by fatigue Patient left: in chair;with call bell/phone within reach;with family/visitor present Nurse Communication: Mobility status PT Visit Diagnosis: Unsteadiness on feet (R26.81);Muscle weakness (generalized) (M62.81);Difficulty in walking, not elsewhere classified (R26.2);Pain     Time: 4098-1191 PT Time Calculation (min) (ACUTE ONLY): 26 min  Charges:    $Therapeutic Activity: 23-37 mins PT General Charges $$ ACUTE PT VISIT: 1 Visit                     Johny Shock, PTA Acute Rehabilitation Services Secure Chat Preferred  [phone]    Johny Shock 05/04/2023, 4:48 PM

## 2023-05-05 LAB — CBC
HCT: 27.1 % — ABNORMAL LOW (ref 36.0–46.0)
Hemoglobin: 8.3 g/dL — ABNORMAL LOW (ref 12.0–15.0)
MCH: 25.7 pg — ABNORMAL LOW (ref 26.0–34.0)
MCHC: 30.6 g/dL (ref 30.0–36.0)
MCV: 83.9 fL (ref 80.0–100.0)
Platelets: 336 10*3/uL (ref 150–400)
RBC: 3.23 MIL/uL — ABNORMAL LOW (ref 3.87–5.11)
RDW: 16.3 % — ABNORMAL HIGH (ref 11.5–15.5)
WBC: 21.9 10*3/uL — ABNORMAL HIGH (ref 4.0–10.5)
nRBC: 0.4 % — ABNORMAL HIGH (ref 0.0–0.2)

## 2023-05-05 LAB — HEPARIN LEVEL (UNFRACTIONATED)
Heparin Unfractionated: 0.52 [IU]/mL (ref 0.30–0.70)
Heparin Unfractionated: 0.5 [IU]/mL (ref 0.30–0.70)

## 2023-05-05 LAB — GLUCOSE, CAPILLARY
Glucose-Capillary: 120 mg/dL — ABNORMAL HIGH (ref 70–99)
Glucose-Capillary: 128 mg/dL — ABNORMAL HIGH (ref 70–99)
Glucose-Capillary: 145 mg/dL — ABNORMAL HIGH (ref 70–99)
Glucose-Capillary: 152 mg/dL — ABNORMAL HIGH (ref 70–99)
Glucose-Capillary: 168 mg/dL — ABNORMAL HIGH (ref 70–99)
Glucose-Capillary: 168 mg/dL — ABNORMAL HIGH (ref 70–99)

## 2023-05-05 LAB — COMPREHENSIVE METABOLIC PANEL
ALT: 12 U/L (ref 0–44)
AST: 15 U/L (ref 15–41)
Albumin: 2.2 g/dL — ABNORMAL LOW (ref 3.5–5.0)
Alkaline Phosphatase: 51 U/L (ref 38–126)
Anion gap: 10 (ref 5–15)
BUN: 22 mg/dL (ref 8–23)
CO2: 21 mmol/L — ABNORMAL LOW (ref 22–32)
Calcium: 8.4 mg/dL — ABNORMAL LOW (ref 8.9–10.3)
Chloride: 102 mmol/L (ref 98–111)
Creatinine, Ser: 0.71 mg/dL (ref 0.44–1.00)
GFR, Estimated: >60 mL/min (ref >60)
Glucose, Bld: 153 mg/dL — ABNORMAL HIGH (ref 70–99)
Potassium: 3.8 mmol/L (ref 3.5–5.1)
Sodium: 133 mmol/L — ABNORMAL LOW (ref 135–145)
Total Bilirubin: 0.5 mg/dL (ref <1.2)
Total Protein: 5.4 g/dL — ABNORMAL LOW (ref 6.5–8.1)

## 2023-05-05 LAB — PHOSPHORUS: Phosphorus: 3.2 mg/dL (ref 2.5–4.6)

## 2023-05-05 LAB — MAGNESIUM: Magnesium: 2.2 mg/dL (ref 1.7–2.4)

## 2023-05-05 MED ORDER — TRACE MINERALS CU-MN-SE-ZN 300-55-60-3000 MCG/ML IV SOLN
INTRAVENOUS | Status: AC
  Filled 2023-05-05 (×2): qty 1000

## 2023-05-05 MED ORDER — POTASSIUM CHLORIDE 10 MEQ/50ML IV SOLN
10.0000 meq | INTRAVENOUS | Status: AC
  Administered 2023-05-05 (×4): 10 meq via INTRAVENOUS
  Filled 2023-05-05 (×4): qty 50

## 2023-05-05 MED ORDER — FUROSEMIDE 10 MG/ML IJ SOLN
20.0000 mg | Freq: Once | INTRAMUSCULAR | Status: AC
  Administered 2023-05-05: 20 mg via INTRAVENOUS
  Filled 2023-05-05: qty 2

## 2023-05-05 MED ORDER — FAT EMUL FISH OIL/PLANT BASED 20% (SMOFLIPID)IV EMUL
250.0000 mL | INTRAVENOUS | Status: AC
  Administered 2023-05-05: 250 mL via INTRAVENOUS
  Filled 2023-05-05: qty 250

## 2023-05-05 NOTE — Progress Notes (Signed)
TRH night cross cover note:  Will hold pt's evening's dose of Tikosyn in the setting of her current n.p.o. status.     Newton Pigg, DO Hospitalist

## 2023-05-05 NOTE — Consult Note (Signed)
WOC Nurse follow-up consult Note: Vac was leaking; bedside nurse notified the surgical team and they ordered it to be removed. Abd full thickness post-op wound is red and moist, removed black foam and moist gauze dressing applied as requested.  Slightly decreased in size since previous measurements; 24X4X4cm. Pt was medicated for pain prior to the procedure and tolerated with mod amt discomfort.  Orders provided for bedside nurses to change the dressings.   WOC Nurse ostomy consult note Stoma type/location: Stoma is red and viable, 1 1/2 inches, slightly above skin level. Small amt bloody drainage, no stool or flatus. Pt is nauseated and in pain and not ready for teaching.  Applied pouch as follows:  Applied barrier ring and 2 piece convex pouching system. 2 sets of each supply left at the bedside: Use Supplies: Barrier ring [id number] Pouch #649 Barrier [id number].   Enrolled patient in [employer] Secure [employer] DC program: NOT YET WOC team will continue teaching sessions next week.  Thank-you,  Cammie Mcgee MSN, RN, CWOCN, [place], CNS [phone]

## 2023-05-05 NOTE — Progress Notes (Signed)
8 Days Post-Op   Subjective/Chief Complaint: Some nausea today   Objective: Vital signs in last 24 hours: Temp:  [97.6 F (36.4 C)-97.9 F (36.6 C)] 97.8 F (36.6 C) (11/27 0000) Pulse Rate:  [70-78] 75 (11/27 0000) Resp:  [13-22] 20 (11/27 0000) BP: (106-123)/(53-64) 113/53 (11/27 0000) SpO2:  [100 %] 100 % (11/27 0000) Weight:  [87 kg] 87 kg (11/27 0500) Last BM Date : 04/26/23  Intake/Output from previous day: 11/26 0701 - 11/27 0700 In: 1100 [P.O.:50; I.V.:250; NG/GT:800] Out: 1283 [Urine:600; Emesis/NG output:650; Drains:33] Intake/Output this shift: No intake/output data recorded.  PE: Gen:  Alert, NAD Abd: soft, mild distension, generalized tenderness, drain x2 serosanguinous, ostomy pale pink with no stool in bag, open midline pale pink with no erythema or purulent drainage/ fascia intact  Lab Results:  Recent Labs    05/04/23 0448 05/05/23 0102  WBC 21.5* 21.9*  HGB 7.9* 8.3*  HCT 26.4* 27.1*  PLT 355 336   BMET Recent Labs    05/04/23 0448 05/05/23 0102  NA 132* 133*  K 3.5 3.8  CL 100 102  CO2 24 21*  GLUCOSE 191* 153*  BUN 21 22  CREATININE 0.62 0.71  CALCIUM 8.3* 8.4*   PT/INR No results for input(s): "LABPROT", "INR" in the last 72 hours. ABG No results for input(s): "PHART", "HCO3" in the last 72 hours.  Invalid input(s): "PCO2", "PO2"  Studies/Results: DG CHEST PORT 1 VIEW  Result Date: 05/04/2023 CLINICAL DATA:  Abdominal pain and back pain EXAM: PORTABLE CHEST 1 VIEW COMPARISON:  05/03/2023 abdominal CT. FINDINGS: Dual-chamber pacer leads in unremarkable position. An enteric tube at least reaches the stomach. Endotracheal tube with tip just below the clavicular heads. Right PICC with tip at the SVC. Low volume chest with indistinct density at the bases, greater on the left. No effusion or pneumothorax. Extensive artifact from EKG leads. IMPRESSION: 1. Unremarkable hardware positioning. 2. Low volume chest with left more than right  lower lobe atelectasis as seen on abdominal CT from yesterday. Electronically Signed   By: Tiburcio Pea M.D.   On: 05/04/2023 09:32   CT ABDOMEN PELVIS W CONTRAST  Result Date: 05/03/2023 CLINICAL DATA:  Abdominal pain, postop EXAM: CT ABDOMEN AND PELVIS WITH CONTRAST TECHNIQUE: Multidetector CT imaging of the abdomen and pelvis was performed using the standard protocol following bolus administration of intravenous contrast. RADIATION DOSE REDUCTION: This exam was performed according to the departmental dose-optimization program which includes automated exposure control, adjustment of the mA and/or kV according to patient size and/or use of iterative reconstruction technique. CONTRAST:  75mL OMNIPAQUE IOHEXOL 350 MG/ML SOLN COMPARISON:  04/22/2023 FINDINGS: Lower chest: Cardiomegaly. Pacer wires in the right heart. Trace left pleural effusion. Bibasilar airspace opacities most compatible with atelectasis. Hepatobiliary: Prior cholecystectomy. Scattered subpleural hypodensities in the liver are stable since prior study most compatible with cysts. No suspicious hepatic abnormality or biliary ductal dilatation. Pancreas: No focal abnormality or ductal dilatation. Spleen: No focal abnormality.  Normal size. Adrenals/Urinary Tract: Adrenal glands normal. Simple cyst in the lower pole of the left kidney measures 2.8 cm. No follow-up imaging recommended. No stones or hydronephrosis. Urinary bladder grossly unremarkable. Stomach/Bowel: Postoperative changes with left lower quadrant ostomy and Hartmann's pouch. NG tube is in the stomach. Stomach and small bowel mildly dilated into the pelvis. This could reflect postoperative ileus or small-bowel obstruction. Vascular/Lymphatic: Aortic atherosclerosis. No evidence of aneurysm or adenopathy. Reproductive: Prior hysterectomy.  No adnexal masses. Other: Stranding in the pelvis around the Hartmann's  pouch and remaining sigmoid colon, similar to prior study. No drainable  focal fluid collection. No free air. Open midline incision. Musculoskeletal: No acute bony abnormality. IMPRESSION: Postoperative changes with left lower quadrant ostomy. Stomach is mildly dilated with small bowel dilated into the pelvis. Pelvic small bowel loops are decompressed. This could reflect postoperative ileus or distal small bowel obstruction. Stranding around the Hartmann's pouch and sigmoid colon, similar to preoperative study. This could reflect residual inflammation or postoperative changes. No drainable fluid collection. Trace left pleural effusion.  Bibasilar atelectasis. Electronically Signed   By: Charlett Nose M.D.   On: 05/03/2023 17:13   DG Abd Portable 1V  Result Date: 05/03/2023 CLINICAL DATA:  77 year old female enteric tube placement. Postoperative day 6 status post exploratory laparotomy with sigmoid colectomy. EXAM: PORTABLE ABDOMEN - 1 VIEW COMPARISON:  05/01/2023 and earlier. FINDINGS: Portable AP supine view at 0850 hours. The enteric tube has been pulled back, side hole is now at the level of the esophagus. The tube tip is at the GEJ. Stable cholecystectomy clips. Continued gas-filled and dilated small bowel loops in the visible abdomen. Continued featureless appearance large bowel gas in the left abdomen. Left lung base opacity. Cardiac pacer leads. IMPRESSION: 1. Enteric tube has been pulled back with tip now at the GEJ. Advance 8 cm to ensure side hole placement within the stomach. 2. Persistent abnormal visible bowel-gas pattern compatible with bowel obstruction or ileus. 3. Left lung base opacity, consider pleural effusion and atelectasis. Electronically Signed   By: Odessa Fleming M.D.   On: 05/03/2023 09:26    Anti-infectives: Anti-infectives (From admission, onward)    Start     Dose/Rate Route Frequency Ordered Stop   05/03/23 1400  piperacillin-tazobactam (ZOSYN) IVPB 3.375 g        3.375 g 12.5 mL/hr over 240 Minutes Intravenous Every 8 hours 05/03/23 0935     04/27/23  1021  sodium chloride 0.9 % with cefoTEtan (CEFOTAN) ADS Med       Note to Pharmacy: Shanda Bumps M: cabinet override      04/27/23 1021 04/27/23 1253   04/26/23 0800  cefoTEtan (CEFOTAN) 2 g in sodium chloride 0.9 % 100 mL IVPB  Status:  Discontinued        2 g 200 mL/hr over 30 Minutes Intravenous To [facility] 04/23/23 2111 04/27/23 0800   04/22/23 2300  piperacillin-tazobactam (ZOSYN) IVPB 3.375 g        3.375 g 12.5 mL/hr over 240 Minutes Intravenous Every 8 hours 04/22/23 2256 05/02/23 2359   04/22/23 1900  piperacillin-tazobactam (ZOSYN) IVPB 3.375 g        3.375 g 100 mL/hr over 30 Minutes Intravenous  Once 04/22/23 1852 04/22/23 1954       Assessment/Plan: POD #8 elap, sigmoid colectomy, colostomy 11/19- MW - CT 11/25 with ileus, no abscess - Continue NPO/NGT to LIWS and await return in bowel function - oob, pulm toilet - WOC to replace Vac again today. Also following for new ostomy. Will need ostomy clinic referral at discharge - Continue PICC/TPN 11/22 --> - continue JP drain x2 - currently serosanguinous - discussed further leukocytosis w/u with primary team, will start with CXR. Patient does not need any more antibiotics at this point from surgical standpoint - K pad and lidocaine patches for back pain - continue IV tylenol   ID zosyn 11/14>> VTE - heparin heparin gtt for afib  FEN - TPN, NPO/NGT to LIWS  LOS: 13 days    Jed Limerick  Derrell Lolling 05/05/2023

## 2023-05-05 NOTE — Progress Notes (Signed)
PT Cancellation Note  Patient Details Name: Kathleen [name] MRN: [id number] DOB: 1946-04-11   Cancelled Treatment:    Reason Eval/Treat Not Completed: (P) Fatigue/lethargy limiting ability to participate (Pt reports increased fatigue and nausea and declines mobility. Will continue to follow per PT POC.)   Johny Shock 05/05/2023, 4:46 PM

## 2023-05-05 NOTE — Progress Notes (Signed)
PROGRESS NOTE        PATIENT DETAILS Name: Kathleen [name] Age: 77 y.o. Sex: female Date of Birth: 10/12/1945 Admit Date: 04/22/2023 Admitting Physician Gery Pray, MD AOZ:HYQMV, Bernadene Bell, MD  Brief Summary: Patient is a 77 y.o.  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  Admitted by [facility]-started on empiric IV antibiotics-underwent preop assessment by cardiology-and then subsequently underwent  exploratory laparotomy with sigmoid colectomy and end ostomy on 11/19.  Postoperative course complicated by ileus-requiring NG tube decompression-and TNA for nutrition.  See below for further details.  Significant events: 11/14>> admit to [facility] 11/19>>exploratory laparotomy with sigmoid colectomy and end ostomy   Significant studies: 11/14>> CT abdomen/pelvis: Worsening signal right diverticulitis-localized perforation-5.6 cm perirectal abscess, 6.0 cm left lower quadrant abscess. 11/22>> x-ray abdomen: Consistent with postoperative ileus. 11/25>> CT abdomen: No abscess  Significant microbiology data: 11/14>> blood culture: No growth 11/14>> COVID/influenza/RSV PCR: Negative  Procedures: 11/19>>exploratory laparotomy with sigmoid colectomy  end ostomy on 11/19  Consults: Cardiology GI General surgery  Subjective: Still nausea-some cough continues but no other issues overnight.  Still no output from ostomy.  Objective: Vitals: Blood pressure 135/62, pulse 72, temperature (!) 97.4 F (36.3 C), temperature source Oral, resp. rate 16, height 5' (1.524 m), weight 87 kg, SpO2 100%.   Exam: Gen Exam:Alert awake-not in any distress HEENT:atraumatic, normocephalic Chest: B/L clear to auscultation anteriorly CVS:S1S2 regular Abdomen: Soft-incisions  unremarkable-drains in place-no stool in ostomy Extremities:no edema Neurology: Non focal Skin: no rash  Pertinent Labs/Radiology:    Latest Ref Rng & Units 05/05/2023    1:02 AM 05/04/2023    4:48 AM 05/03/2023    6:04 AM  CBC  WBC 4.0 - 10.5 K/uL 21.9  21.5  23.3   Hemoglobin 12.0 - 15.0 g/dL 8.3  7.9  8.8   Hematocrit 36.0 - 46.0 % 27.1  26.4  28.5   Platelets 150 - 400 K/uL 336  355  388     Lab Results  Component Value Date   NA 133 (L) 05/05/2023   K 3.8 05/05/2023   CL 102 05/05/2023   CO2 21 (L) 05/05/2023     Assessment/Plan: 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 with localized perforation and pelvic abscess-s/p ex lap with colectomy/ostomy on 11/19-complicated by postoperative ileus requiring NG tube decompression Essentially unchanged Await recovery of bowel function-no stool in ostomy yet CT abdomen 11/25 without abscess-leukocytosis persists but suspect this is reactive-no obvious sources of infection apparent-Continue Zosyn-EOT 11/28 PNA per pharmacy General Surgery following Encourage incentive spirometry/flutter valve Mobilize with PT/OT Out of bed to chair  Hypokalemia/hypophosphatemia Repleted  Normocytic anemia Due to critical illness-HPI to be stable Follow closely-no evidence of blood loss.    Persistent atrial fibrillation Telemetry monitoring Tikosyn IV heparin restarted 11/23-some mild drop in Hb but no overt GI bleeding noted. 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  GERD PPI  DM-2 (A1c 6.2 on 11/15) CBGs stable on SSI  Recent Labs    05/05/23 0024 05/05/23 0331 05/05/23 0845  GLUCAP 145* 168* 120*   Nutrition Status: Nutrition Problem: Moderate Malnutrition Etiology: chronic illness Signs/Symptoms: mild fat depletion, mild muscle depletion, energy intake < 75% for > 7 days Interventions: Ensure Enlive (each supplement provides 350kcal and 20 grams of protein), Magic cup, MVI  Obesity: Estimated body mass index is 37.46 kg/m as calculated from the following:   Height as of this encounter: 5' (1.524 m).   Weight as of this encounter: 87 kg.   Code status:   Code Status: Limited: Do not attempt resuscitation (DNR) -DNR-LIMITED -Do Not Intubate/DNI    DVT Prophylaxis: IV heparin   Family Communication: Son at bedside   Disposition Plan: Status is: Inpatient Remains inpatient appropriate because: Severity of illness   Planned Discharge Destination:Skilled nursing facility   Diet: Diet Order             Diet NPO time specified Except for: Ice Chips  Diet effective now                     Antimicrobial agents: Anti-infectives (From admission, onward)    Start     Dose/Rate Route Frequency Ordered Stop   05/03/23 1400  piperacillin-tazobactam (ZOSYN) IVPB 3.375 g        3.375 g 12.5 mL/hr over 240 Minutes Intravenous Every 8 hours 05/03/23 0935 05/06/23 2359   04/27/23 1021  sodium chloride 0.9 % with cefoTEtan (CEFOTAN) ADS Med       Note to Pharmacy: Shanda Bumps M: cabinet override      04/27/23 1021 04/27/23 1253   04/26/23 0800  cefoTEtan (CEFOTAN) 2 g in sodium chloride 0.9 % 100 mL IVPB  Status:  Discontinued        2 g 200 mL/hr over 30 Minutes Intravenous To [facility] 04/23/23 2111 04/27/23 0800   04/22/23 2300  piperacillin-tazobactam (ZOSYN) IVPB 3.375 g        3.375 g 12.5 mL/hr over 240 Minutes Intravenous Every 8 hours 04/22/23 2256 05/02/23 2359   04/22/23 1900   piperacillin-tazobactam (ZOSYN) IVPB 3.375 g        3.375 g 100 mL/hr over 30 Minutes Intravenous  Once 04/22/23 1852 04/22/23 1954        MEDICATIONS: Scheduled Meds:  Chlorhexidine Gluconate Cloth  6 each Topical Daily   dofetilide  250 mcg Oral Q12H   fluticasone furoate-vilanterol  1 puff Inhalation Daily   insulin aspart  0-15 Units Subcutaneous Q4H   ipratropium-albuterol  3 mL Nebulization BID   lidocaine  1 patch Transdermal Q24H   methocarbamol (ROBAXIN) injection  500 mg Intravenous Q6H   mouth rinse  15 mL Mouth Rinse 4 times per day   pantoprazole (PROTONIX) IV  40 mg Intravenous Q12H   revefenacin  175 mcg Nebulization Daily   Continuous Infusions:  TPN (CLINIMIX-E) Adult     And   fat emul(SMOFlipid)     heparin 1,900 Units/hr (05/05/23 0540)   piperacillin-tazobactam (ZOSYN)  IV 3.375 g (05/05/23 0516)   potassium chloride 10 mEq (05/05/23 1116)   promethazine (PHENERGAN) injection (IM or IVPB) Stopped (05/02/23 1900)   TPN (CLINIMIX-E) Adult 42 mL/hr at 05/04/23 1737   PRN Meds:.albuterol, guaiFENesin-dextromethorphan, HYDROmorphone (DILAUDID) injection, ondansetron (ZOFRAN) IV, promethazine (PHENERGAN) injection (IM or IVPB), sodium chloride flush   I have personally  reviewed following labs and imaging studies  LABORATORY DATA: CBC: Recent Labs  Lab 05/01/23 0440 05/02/23 0359 05/03/23 0604 05/04/23 0448 05/05/23 0102  WBC 22.3* 18.1* 23.3* 21.5* 21.9*  HGB 8.9* 8.9* 8.8* 7.9* 8.3*  HCT 29.7* 29.8* 28.5* 26.4* 27.1*  MCV 84.4 85.9 85.1 84.3 83.9  PLT 444* 412* 388 355 336    Basic Metabolic Panel: Recent Labs  Lab 05/01/23 0440 05/02/23 0359 05/03/23 0604 05/04/23 0448 05/05/23 0102  NA 136 136 133* 132* 133*  K 3.4* 3.3* 3.7 3.5 3.8  CL 100 101 99 100 102  CO2 26 25 24 24  21*  GLUCOSE 195* 171* 219* 191* 153*  BUN 16 18 23 21 22   CREATININE 0.62 0.64 0.78 0.62 0.71  CALCIUM 7.9* 8.0* 8.3* 8.3* 8.4*  MG 2.4 2.4 2.2 2.3 2.2  PHOS  2.4* 2.1* 2.3* 3.3 3.2    GFR: Estimated Creatinine Clearance: 58.7 mL/min (by C-G formula based on SCr of 0.71 mg/dL).  Liver Function Tests: Recent Labs  Lab 05/01/23 0440 05/02/23 0359 05/03/23 0604 05/04/23 0448 05/05/23 0102  AST 10* 12* 14* 15 15  ALT 13 11 12 11 12   ALKPHOS 54 47 44 47 51  BILITOT 0.4 0.4 0.4 0.5 0.5  PROT 5.1* 5.2* 5.2* 5.1* 5.4*  ALBUMIN 2.0* 2.1* 2.1* 2.0* 2.2*   No results for input(s): "LIPASE", "AMYLASE" in the last 168 hours. No results for input(s): "AMMONIA" in the last 168 hours.  Coagulation Profile: No results for input(s): "INR", "PROTIME" in the last 168 hours.  Cardiac Enzymes: No results for input(s): "CKTOTAL", "CKMB", "CKMBINDEX", "TROPONINI" in the last 168 hours.  BNP (last 3 results) No results for input(s): "PROBNP" in the last 8760 hours.  Lipid Profile: Recent Labs    05/03/23 0604  TRIG 184*    Thyroid Function Tests: No results for input(s): "TSH", "T4TOTAL", "FREET4", "T3FREE", "THYROIDAB" in the last 72 hours.  Anemia Panel: No results for input(s): "VITAMINB12", "FOLATE", "FERRITIN", "TIBC", "IRON", "RETICCTPCT" in the last 72 hours.  Urine analysis:    Component Value Date/Time   COLORURINE AMBER (A) 04/23/2023 0623   APPEARANCEUR CLOUDY (A) 04/23/2023 0623   APPEARANCEUR Hazy 12/25/2012 1653   LABSPEC 1.024 04/23/2023 0623   LABSPEC 1.021 12/25/2012 1653   PHURINE 5.0 04/23/2023 0623   GLUCOSEU NEGATIVE 04/23/2023 0623   GLUCOSEU NEGATIVE 03/17/2023 1003   HGBUR MODERATE (A) 04/23/2023 0623   BILIRUBINUR NEGATIVE 04/23/2023 0623   BILIRUBINUR Negative 12/25/2012 1653   KETONESUR NEGATIVE 04/23/2023 0623   PROTEINUR 30 (A) 04/23/2023 0623   UROBILINOGEN 0.2 03/17/2023 1003   NITRITE NEGATIVE 04/23/2023 0623   LEUKOCYTESUR LARGE (A) 04/23/2023 0623   LEUKOCYTESUR Negative 12/25/2012 1653    Sepsis Labs: Lactic Acid, Venous    Component Value Date/Time   LATICACIDVEN 0.8 04/23/2023 0114     MICROBIOLOGY: Recent Results (from the past 240 hour(s))  Surgical PCR screen     Status: None   Collection Time: 04/27/23 12:21 AM   Specimen: Nasal Mucosa; Nasal Swab  Result Value Ref Range Status   MRSA, PCR NEGATIVE NEGATIVE Final   Staphylococcus aureus NEGATIVE NEGATIVE Final    Comment: (NOTE) The Xpert SA Assay (FDA approved for NASAL specimens in patients 43 years of age and older), is one component of a comprehensive surveillance program. It is not intended to diagnose infection nor to guide or monitor treatment. Performed at [facility], [address], [place], [place] [address]     RADIOLOGY  STUDIES/RESULTS: DG CHEST PORT 1 VIEW  Result Date: 05/04/2023 CLINICAL DATA:  Abdominal pain and back pain EXAM: PORTABLE CHEST 1 VIEW COMPARISON:  05/03/2023 abdominal CT. FINDINGS: Dual-chamber pacer leads in unremarkable position. An enteric tube at least reaches the stomach. Endotracheal tube with tip just below the clavicular heads. Right PICC with tip at the SVC. Low volume chest with indistinct density at the bases, greater on the left. No effusion or pneumothorax. Extensive artifact from EKG leads. IMPRESSION: 1. Unremarkable hardware positioning. 2. Low volume chest with left more than right lower lobe atelectasis as seen on abdominal CT from yesterday. Electronically Signed   By: Tiburcio Pea M.D.   On: 05/04/2023 09:32   CT ABDOMEN PELVIS W CONTRAST  Result Date: 05/03/2023 CLINICAL DATA:  Abdominal pain, postop EXAM: CT ABDOMEN AND PELVIS WITH CONTRAST TECHNIQUE: Multidetector CT imaging of the abdomen and pelvis was performed using the standard protocol following bolus administration of intravenous contrast. RADIATION DOSE REDUCTION: This exam was performed according to the departmental dose-optimization program which includes automated exposure control, adjustment of the mA and/or kV according to patient size and/or use of iterative reconstruction technique.  CONTRAST:  75mL OMNIPAQUE IOHEXOL 350 MG/ML SOLN COMPARISON:  04/22/2023 FINDINGS: Lower chest: Cardiomegaly. Pacer wires in the right heart. Trace left pleural effusion. Bibasilar airspace opacities most compatible with atelectasis. Hepatobiliary: Prior cholecystectomy. Scattered subpleural hypodensities in the liver are stable since prior study most compatible with cysts. No suspicious hepatic abnormality or biliary ductal dilatation. Pancreas: No focal abnormality or ductal dilatation. Spleen: No focal abnormality.  Normal size. Adrenals/Urinary Tract: Adrenal glands normal. Simple cyst in the lower pole of the left kidney measures 2.8 cm. No follow-up imaging recommended. No stones or hydronephrosis. Urinary bladder grossly unremarkable. Stomach/Bowel: Postoperative changes with left lower quadrant ostomy and Hartmann's pouch. NG tube is in the stomach. Stomach and small bowel mildly dilated into the pelvis. This could reflect postoperative ileus or small-bowel obstruction. Vascular/Lymphatic: Aortic atherosclerosis. No evidence of aneurysm or adenopathy. Reproductive: Prior hysterectomy.  No adnexal masses. Other: Stranding in the pelvis around the Hartmann's pouch and remaining sigmoid colon, similar to prior study. No drainable focal fluid collection. No free air. Open midline incision. Musculoskeletal: No acute bony abnormality. IMPRESSION: Postoperative changes with left lower quadrant ostomy. Stomach is mildly dilated with small bowel dilated into the pelvis. Pelvic small bowel loops are decompressed. This could reflect postoperative ileus or distal small bowel obstruction. Stranding around the Hartmann's pouch and sigmoid colon, similar to preoperative study. This could reflect residual inflammation or postoperative changes. No drainable fluid collection. Trace left pleural effusion.  Bibasilar atelectasis. Electronically Signed   By: Charlett Nose M.D.   On: 05/03/2023 17:13     LOS: 13 days   Jeoffrey Massed, MD  [facility]    To contact the attending provider between 7A-7P or the covering provider during after hours 7P-7A, please log into the web site [email or web address] and access using universal [place] password for that web site. If you do not have the password, please call the hospital operator.  05/05/2023, 11:39 AM

## 2023-05-05 NOTE — Progress Notes (Signed)
PHARMACY - ANTICOAGULATION CONSULT NOTE  Pharmacy Consult for Heparin gtt Indication: atrial fibrillation  Patient Measurements: Height: 5' (152.4 cm) Weight: 87 kg (191 lb 12.8 oz) IBW/kg (Calculated) : 45.5 Heparin Dosing Weight: 64 kg  Vital Signs: Temp: 97.4 F (36.3 C) (11/27 0844) Temp Source: Oral (11/27 0844) BP: 135/62 (11/27 0844) Pulse Rate: 72 (11/27 0844)  Labs: Recent Labs    05/02/23 2110 05/02/23 2110 05/03/23 0604 05/03/23 1721 05/04/23 0448 05/04/23 1454 05/05/23 0102 05/05/23 0953  HGB  --    < > 8.8*  --  7.9*  --  8.3*  --   HCT  --   --  28.5*  --  26.4*  --  27.1*  --   PLT  --   --  388  --  355  --  336  --   APTT 43*  --   --   --   --   --   --   --   HEPARINUNFRC 0.22*  --  0.19*   < > 0.23* 0.44 0.52 0.50  CREATININE  --   --  0.78  --  0.62  --  0.71  --   TROPONINIHS  --   --  5  --   --   --   --   --    < > = values in this interval not displayed.   Estimated Creatinine Clearance: 58.7 mL/min (by C-G formula based on SCr of 0.71 mg/dL).  Assessment: [name] with history of atrial fibrillation on Xarelto prior to admission. Last dose of Xarelto 04/21/23 at 1900. Admitted for recurrent diverticulitis, perforation, and perirectal abscess. 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 Cleared by surgery to start hep gtt for afib.  Heparin level is therapeutic at 0.5 on heparin 1900 units/hr. No problems with the infusion or bleeding reported per RN. CBC stable  Goal of Therapy:  Heparin level 0.3-0.7 units/ml Monitor platelets by anticoagulation protocol: Yes   Plan:  No bolus per surgery Continue heparin at 1900 units/hr Heparin level daily CBC daily  Monitor for signs/symptoms of bleeding   Thank you for allowing pharmacy to be a part of this patient's care.   Signe Colt, PharmD 05/05/2023 11:18 AM  **Pharmacist phone directory can be found on [email or web address] listed under [facility]**

## 2023-05-05 NOTE — Progress Notes (Signed)
Occupational Therapy Treatment Patient Details Name: Kathleen [name] MRN: [id number] DOB: 1946/02/12 Today's Date: 05/05/2023   History of present illness 77 y.o. 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 S/p exploratory laparotomy with sigmoid colectomy and end left colostomy by Dr. Dwain Sarna on 04/27/2023.  PMHx: colovaginal fistula associated with diverticulitis, obesity, OSA , BOOP, tracheobronchomalacia, chronic respiratory failure on 4 to 5 L oxygen at baseline, persistent A-fib S/P pacemaker implant 2017, chronic systolic heart failure, recent admission from 04/09/2023 to 04/13/2023.   OT comments  Pt continues to be limited by nausea and pain, receptive to small OOB movement such as pivot transfers but not receptive to ambulation in room at this time. Pt was not fond of performing standing BLE exercises to progress strengthening and standing activity tolerance, was open to seated exercises. OT to continue to progress pt as able, Dc plans remain appropriate for SNF.       If plan is discharge home, recommend the following:  A little help with walking and/or transfers;A lot of help with bathing/dressing/bathroom;Assistance with cooking/housework;Assist for transportation;Help with stairs or ramp for entrance   Equipment Recommendations  None recommended by OT    Recommendations for Other Services      Precautions / Restrictions Precautions Precautions: Fall Precaution Comments: multiple drains, NGT, wound vac Required Braces or Orthoses: Other Brace Other Brace: Abdominal binder Restrictions Weight Bearing Restrictions: No       Mobility Bed Mobility Overal bed mobility: Modified Independent             General bed mobility comments: increased time    Transfers Overall transfer level: Needs assistance Equipment used: 1 person hand held assist Transfers: Sit to/from Stand, Bed to chair/wheelchair/BSC Sit to Stand: Min  assist Stand pivot transfers: Contact guard assist         General transfer comment: pivot from bed to [facility], then BSC to recliner both CGA with HHA for safety     Balance Overall balance assessment: Needs assistance Sitting-balance support: Single extremity supported, Feet supported Sitting balance-Leahy Scale: Good Sitting balance - Comments: sitting EOB   Standing balance support: Single extremity supported, During functional activity Standing balance-Leahy Scale: Fair                             ADL either performed or assessed with clinical judgement   ADL                           Toilet Transfer: Stand-pivot;Minimal Surveyor, quantity Details (indicate cue type and reason): declined RW use Toileting- Clothing Manipulation and Hygiene: Sit to/from stand;Maximal assistance Toileting - Clothing Manipulation Details (indicate cue type and reason): for wiping       General ADL Comments: Pt declined ambulation to sink, was open to transfers at bedside    Extremity/Trunk Assessment              Vision       Perception     Praxis      Cognition Arousal: Alert Behavior During Therapy: Anxious Overall Cognitive Status: Within Functional Limits for tasks assessed                                 General Comments: anxious with OOB mobility.        Exercises General Exercises -  Lower Extremity Hip ABduction/ADduction: AROM, Seated, Both, 10 reps Straight Leg Raises: AROM, Both, Seated, 10 reps Hip Flexion/Marching: AROM, Seated, Both, 10 reps    Shoulder Instructions       General Comments VSS on supplemental 02    Pertinent Vitals/ Pain       Pain Assessment Pain Assessment: Faces Faces Pain Scale: Hurts little more Pain Location: abdomen Pain Descriptors / Indicators: Grimacing, Discomfort Pain Intervention(s): Limited activity within patient's tolerance, Monitored during session,  Repositioned  Home Living                                          Prior Functioning/Environment              Frequency  Min 1X/week        Progress Toward Goals  OT Goals(current goals can now be found in the care plan section)  Progress towards OT goals: Progressing toward goals  Acute Rehab OT Goals Patient Stated Goal: to reduce pain OT Goal Formulation: With patient Time For Goal Achievement: 05/12/23 Potential to Achieve Goals: Good  Plan      Co-evaluation                 AM-PAC OT "6 Clicks" Daily Activity     Outcome Measure   Help from another person eating meals?: None Help from another person taking care of personal grooming?: A Little Help from another person toileting, which includes using toliet, bedpan, or urinal?: A Lot Help from another person bathing (including washing, rinsing, drying)?: Total Help from another person to put on and taking off regular upper body clothing?: A Lot Help from another person to put on and taking off regular lower body clothing?: Total 6 Click Score: 13    End of Session Equipment Utilized During Treatment: Oxygen  OT Visit Diagnosis: Unsteadiness on feet (R26.81);Other abnormalities of gait and mobility (R26.89);Muscle weakness (generalized) (M62.81);Pain   Activity Tolerance Patient tolerated treatment well   Patient Left in chair;with call bell/phone within reach;with nursing/sitter in room;with family/visitor present (grandson in room)   Nurse Communication Mobility status        Time: 1610-9604 OT Time Calculation (min): 37 min  Charges: OT General Charges $OT Visit: 1 Visit OT Treatments $Self Care/Home Management : 8-22 mins $Therapeutic Activity: 8-22 mins  05/05/2023  AB, OTR/L  Acute Rehabilitation Services  Office: [phone]   Tristan Schroeder 05/05/2023, 3:19 PM

## 2023-05-05 NOTE — Progress Notes (Signed)
PHARMACY - TOTAL PARENTERAL NUTRITION CONSULT NOTE   Indication: Prolonged ileus  Patient Measurements: Height: 5' (152.4 cm) Weight: 87 kg (191 lb 12.8 oz) IBW/kg (Calculated) : 45.5 TPN AdjBW (KG): 54 Body mass index is 37.46 kg/m. 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 Patient has been NPO since surgery.   Glucose / Insulin: hx T2DM, A1c 6.2% this admit; no DM meds PTA; CBGs <200. 11u mSSI/24 hours >> requirements improved with decrease in CHO amount from TPN (no longer receiving D10 gtt) Electrolytes: K up to 3.8 (s/p 4 K runs; goal >4), Phos 3.2, Mg 2.2 (goal >2), CoCa 9.8, Na 133 Renal: SCr <1, BUN stable Hepatic: LFTs wnl, alk phos wnl, Tbili wnl; TG 184, alb 2.2 Intake / Output; MIVF: UOP 0.3 ml/kg/hr (s/p Lasix 20mg  IV x1). JP drains 33mL. LBM 11/18. Colostomy output - 0 mL; NG output 650 mL; net neg 2L, non-pitting edema noted GI Imaging: 11/21 KUB moderately dilated loops of small bowel, c/w post-operative ileus 11/23 KUB: persistent small bowel obstruction or ileus, no significant change from prior 11/25 KUB: bowel obstruction or ileus, pleural effusion vs atelectasis 11/25 CTAP: post-op ileus or distal SBO, trace L pleural effusion 11/26 CXR: L > R lower lobe atelectasis GI Surgeries / Procedures:  11/19 ex lap with sigmoid colectomy and end left colostomy  Central access: PICC placed 04/30/23 TPN start date: 04/30/23  Nutritional Goals: Clinimix E 8/10 1L at 42 ml/hr with SMOF lipids on MTWThFSa Clinimix E 8/10 2L at 82 ml/hr on Sun - Average of 1258 kcals per day (~80% of goal) and 91g protein per day (100% goal) Previously on D10 fluids for add'l kCal >> stopped on  11/26 given concerns for volume overload (pleural effusions/atelectasis noted on imaging, weights incr, req Lasix IV)   Electrolytes in Clinimix E 8/10 1L bag: Na 35 mEq, K 30 mEq, Mag 5 mEq, Ca 5 mEq, Phos , Acetate 83, Cl 76 mEq Electrolytes in Clinimix E 8/10 2L bag: Na , K , Mag , Ca , Phos , Ac , Cl .   RD Assessment: Estimated Needs Total Energy Estimated Needs: 1600-1800 kcal Total Protein Estimated Needs: 75-95 gm Total Fluid Estimated Needs: >1.6L  Current Nutrition:  NPO except for ice chips TPN 11/22 >>  Plan:  Clinimix E 8/10 1L @ 42 mL/hr + SMOF lipids on MTWThFSa Clinimix E 8/19 2L @ 82 mL/hr + SMOF lipids on Sunday  Electrolytes in Clinimix E 8/10 1L bag: Na 35 mEq, K 30 mEq, Mag 5 mEq, Ca 5 mEq, Phos , Acetate 83, Cl 76 mEq Electrolytes in Clinimix E 8/10 2L bag: Na , K , Mag , Ca , Phos , Ac , Cl .    Continue standard MVI and trace elements to TPN Continue Moderate q4h SSI and adjust as needed  Monitor TPN labs daily on Clinimix.  Watch volume status and weight trends   Kcl 10 mEq IV x4 runs (goal K >4 on Tikosyn)  Rexford Maus, PharmD, BCPS 05/05/2023 8:00 AM

## 2023-05-06 LAB — GLUCOSE, CAPILLARY
Glucose-Capillary: 123 mg/dL — ABNORMAL HIGH (ref 70–99)
Glucose-Capillary: 128 mg/dL — ABNORMAL HIGH (ref 70–99)
Glucose-Capillary: 130 mg/dL — ABNORMAL HIGH (ref 70–99)
Glucose-Capillary: 143 mg/dL — ABNORMAL HIGH (ref 70–99)
Glucose-Capillary: 150 mg/dL — ABNORMAL HIGH (ref 70–99)
Glucose-Capillary: 160 mg/dL — ABNORMAL HIGH (ref 70–99)

## 2023-05-06 LAB — CBC
HCT: 29.9 % — ABNORMAL LOW (ref 36.0–46.0)
Hemoglobin: 8.6 g/dL — ABNORMAL LOW (ref 12.0–15.0)
MCH: 25.6 pg — ABNORMAL LOW (ref 26.0–34.0)
MCHC: 28.8 g/dL — ABNORMAL LOW (ref 30.0–36.0)
MCV: 89 fL (ref 80.0–100.0)
Platelets: 351 10*3/uL (ref 150–400)
RBC: 3.36 MIL/uL — ABNORMAL LOW (ref 3.87–5.11)
RDW: 17.2 % — ABNORMAL HIGH (ref 11.5–15.5)
WBC: 16.1 10*3/uL — ABNORMAL HIGH (ref 4.0–10.5)
nRBC: 0.6 % — ABNORMAL HIGH (ref 0.0–0.2)

## 2023-05-06 LAB — COMPREHENSIVE METABOLIC PANEL
ALT: 11 U/L (ref 0–44)
AST: 12 U/L — ABNORMAL LOW (ref 15–41)
Albumin: 2.2 g/dL — ABNORMAL LOW (ref 3.5–5.0)
Alkaline Phosphatase: 48 U/L (ref 38–126)
Anion gap: 7 (ref 5–15)
BUN: 19 mg/dL (ref 8–23)
CO2: 20 mmol/L — ABNORMAL LOW (ref 22–32)
Calcium: 8.2 mg/dL — ABNORMAL LOW (ref 8.9–10.3)
Chloride: 103 mmol/L (ref 98–111)
Creatinine, Ser: 0.68 mg/dL (ref 0.44–1.00)
GFR, Estimated: >60 mL/min (ref >60)
Glucose, Bld: 173 mg/dL — ABNORMAL HIGH (ref 70–99)
Potassium: 3.9 mmol/L (ref 3.5–5.1)
Sodium: 130 mmol/L — ABNORMAL LOW (ref 135–145)
Total Bilirubin: 0.4 mg/dL (ref <1.2)
Total Protein: 5.4 g/dL — ABNORMAL LOW (ref 6.5–8.1)

## 2023-05-06 LAB — HEPARIN LEVEL (UNFRACTIONATED): Heparin Unfractionated: 0.41 [IU]/mL (ref 0.30–0.70)

## 2023-05-06 LAB — MAGNESIUM: Magnesium: 2.2 mg/dL (ref 1.7–2.4)

## 2023-05-06 LAB — PHOSPHORUS: Phosphorus: 3.1 mg/dL (ref 2.5–4.6)

## 2023-05-06 MED ORDER — FAT EMUL FISH OIL/PLANT BASED 20% (SMOFLIPID)IV EMUL
250.0000 mL | INTRAVENOUS | Status: AC
  Administered 2023-05-06: 250 mL via INTRAVENOUS
  Filled 2023-05-06: qty 250

## 2023-05-06 MED ORDER — POTASSIUM CHLORIDE 10 MEQ/50ML IV SOLN
10.0000 meq | INTRAVENOUS | Status: AC
  Administered 2023-05-06 (×3): 10 meq via INTRAVENOUS
  Filled 2023-05-06 (×3): qty 50

## 2023-05-06 MED ORDER — TRACE MINERALS CU-MN-SE-ZN 300-55-60-3000 MCG/ML IV SOLN
INTRAVENOUS | Status: AC
  Filled 2023-05-06 (×2): qty 1000

## 2023-05-06 NOTE — Plan of Care (Signed)

## 2023-05-06 NOTE — Progress Notes (Signed)
PHARMACY - TOTAL PARENTERAL NUTRITION CONSULT NOTE   Indication: Prolonged ileus  Patient Measurements: Height: 5' (152.4 cm) Weight: 85.3 kg (188 lb 0.8 oz) IBW/kg (Calculated) : 45.5 TPN AdjBW (KG): 54 Body mass index is 36.73 kg/m. 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 Patient has been NPO since surgery.   Glucose / Insulin: hx T2DM, A1c 6.2% this admit; no DM meds PTA; CBGs <180. 9 units mSSI/24 hours  Electrolytes: K up to 3.9 (goal >4), Phos 3.1, Mg 2.2 (goal >2), CoCa 9.6, Na 130 Renal: SCr <1, BUN stable Hepatic: LFTs wnl, alk phos wnl, Tbili wnl; TG 184, alb 2.2 Intake / Output; MIVF: UOP 0.6 ml/kg/hr (IV lasix 20mg  11/26, 11/27). Colostomy output - 0 mL; NG output 700 mL GI Imaging: 11/21 KUB moderately dilated loops of small bowel, c/w post-operative ileus 11/23 KUB: persistent small bowel obstruction or ileus, no significant change from prior 11/25 KUB: bowel obstruction or ileus, pleural effusion vs atelectasis 11/25 CTAP: post-op ileus or distal SBO, trace L pleural effusion 11/26 CXR: L > R lower lobe atelectasis GI Surgeries / Procedures:  11/19 ex lap with sigmoid colectomy and end left colostomy  Central access: PICC placed 04/30/23 TPN start date: 04/30/23  Nutritional Goals: Clinimix E 8/10 1L at 42 ml/hr with SMOF lipids on MTWThFSa Clinimix E 8/10 2L at 82 ml/hr on Sun - Average of 1258 kcals per day (~80% of goal) and 91g protein per day (100% goal) Previously on D10 fluids for add'l kCal >> stopped on 11/26 given concerns for volume overload (pleural effusions/atelectasis noted on imaging, weights increased, requiring Lasix IV)   Electrolytes in Clinimix E 8/10 1L bag:  Na 35 mEq, K 30 mEq, Mag 5 mEq, Ca 5 mEq, Phos , Acetate 83, Cl 76 mEq Electrolytes in Clinimix E 8/10 2L bag: Na , K , Mag , Ca , Phos , Ac , Cl .   RD Assessment: Estimated Needs Total Energy Estimated Needs: 1600-1800 kcal Total Protein Estimated Needs: 75-95 gm Total Fluid Estimated Needs: >1.6L  Current Nutrition:  NPO except for ice chips TPN 11/22 >>  Plan:  Clinimix E 8/10 1L @ 42 mL/hr + SMOF lipids on MTWThFSa Clinimix E 8/19 2L @ 82 mL/hr + SMOF lipids on Sunday  Electrolytes in Clinimix E 8/10 1L bag: Na 35 mEq, K 30 mEq, Mag 5 mEq, Ca 5 mEq, Phos , Acetate 83, Cl 76 mEq Electrolytes in Clinimix E 8/10 2L bag: Na , K , Mag , Ca , Phos , Ac , Cl .    Continue standard MVI and trace elements to TPN Continue Moderate q4h SSI and adjust as needed  Monitor TPN labs Mon and Thur and PRN  - BMET in a.m.  Kcl 10 mEq IV x3 runs (goal K >4 on Tikosyn)  Christoper Fabian, PharmD, BCPS Please see amion for complete clinical pharmacist phone list 05/06/2023 7:35 AM

## 2023-05-06 NOTE — Plan of Care (Signed)
Problem: Education: Goal: Ability to describe self-care measures that may prevent or decrease complications (Diabetes Survival Skills Education) will improve Outcome: Progressing Goal: Individualized Educational Video(s) Outcome: Progressing   Problem: Coping: Goal: Ability to adjust to condition or change in health will improve Outcome: Progressing   Problem: Fluid Volume: Goal: Ability to maintain a balanced intake and output will improve Outcome: Progressing   Problem: Health Behavior/Discharge Planning: Goal: Ability to identify and utilize available resources and services will improve Outcome: Progressing Goal: Ability to manage health-related needs will improve Outcome: Progressing   Problem: Metabolic: Goal: Ability to maintain appropriate glucose levels will improve Outcome: Progressing   Problem: Nutritional: Goal: Maintenance of adequate nutrition will improve Outcome: Progressing Goal: Progress toward achieving an optimal weight will improve Outcome: Progressing   Problem: Skin Integrity: Goal: Risk for impaired skin integrity will decrease Outcome: Progressing   Problem: Tissue Perfusion: Goal: Adequacy of tissue perfusion will improve Outcome: Progressing   Problem: Education: Goal: Knowledge of General Education information will improve Description: Including pain rating scale, medication(s)/side effects and non-pharmacologic comfort measures Outcome: Progressing   Problem: Health Behavior/Discharge Planning: Goal: Ability to manage health-related needs will improve Outcome: Progressing   Problem: Clinical Measurements: Goal: Ability to maintain clinical measurements within normal limits will improve Outcome: Progressing Goal: Will remain free from infection Outcome: Progressing Goal: Diagnostic test results will improve Outcome: Progressing Goal: Cardiovascular complication will be avoided Outcome: Progressing   Problem: Activity: Goal: Risk  for activity intolerance will decrease Outcome: Progressing   Problem: Nutrition: Goal: Adequate nutrition will be maintained Outcome: Progressing   Problem: Coping: Goal: Level of anxiety will decrease Outcome: Progressing   Problem: Elimination: Goal: Will not experience complications related to bowel motility Outcome: Progressing Goal: Will not experience complications related to urinary retention Outcome: Progressing   Problem: Pain Management: Goal: General experience of comfort will improve Outcome: Progressing   Problem: Safety: Goal: Ability to remain free from injury will improve Outcome: Progressing   Problem: Skin Integrity: Goal: Risk for impaired skin integrity will decrease Outcome: Progressing   Problem: Education: Goal: Understanding of discharge needs will improve Outcome: Progressing Goal: Verbalization of understanding of the causes of altered bowel function will improve Outcome: Progressing   Problem: Activity: Goal: Ability to tolerate increased activity will improve Outcome: Progressing   Problem: Bowel/Gastric: Goal: Gastrointestinal status for postoperative course will improve Outcome: Progressing   Problem: Health Behavior/Discharge Planning: Goal: Identification of community resources to assist with postoperative recovery needs will improve Outcome: Progressing   Problem: Nutritional: Goal: Will attain and maintain optimal nutritional status will improve Outcome: Progressing   Problem: Clinical Measurements: Goal: Postoperative complications will be avoided or minimized Outcome: Progressing   Problem: Respiratory: Goal: Respiratory status will improve Outcome: Progressing   Problem: Skin Integrity: Goal: Will show signs of wound healing Outcome: Progressing

## 2023-05-06 NOTE — Progress Notes (Signed)
PHARMACY - ANTICOAGULATION CONSULT NOTE  Pharmacy Consult for Heparin gtt Indication: atrial fibrillation  Patient Measurements: Height: 5' (152.4 cm) Weight: 85.3 kg (188 lb 0.8 oz) IBW/kg (Calculated) : 45.5 Heparin Dosing Weight: 64 kg  Vital Signs: Temp: 98.1 F (36.7 C) (11/28 0002) Temp Source: Oral (11/28 0002) BP: 114/53 (11/28 0400) Pulse Rate: 75 (11/28 0400)  Labs: Recent Labs    05/04/23 0448 05/04/23 1454 05/05/23 0102 05/05/23 0953 05/06/23 0346  HGB 7.9*  --  8.3*  --  8.6*  HCT 26.4*  --  27.1*  --  29.9*  PLT 355  --  336  --  351  HEPARINUNFRC 0.23*   < > 0.52 0.50 0.41  CREATININE 0.62  --  0.71  --  0.68   < > = values in this interval not displayed.   Estimated Creatinine Clearance: 58 mL/min (by C-G formula based on SCr of 0.68 mg/dL).  Assessment: [name] with history of atrial fibrillation on Xarelto prior to admission. Last dose of Xarelto 04/21/23 at 1900. Admitted for recurrent diverticulitis, perforation, and perirectal abscess. 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 Cleared by surgery to start hep gtt for afib.  Heparin level remains therapeutic at 0.41 on heparin 1900 units/hr. No problems with the infusion or bleeding reported per RN. CBC stable  Goal of Therapy:  Heparin level 0.3-0.7 units/ml Monitor platelets by anticoagulation protocol: Yes   Plan:  No bolus per surgery Continue heparin at 1900 units/hr Heparin level daily CBC daily  Monitor for signs/symptoms of bleeding   Thank you for allowing pharmacy to be a part of this patient's care.   Signe Colt, PharmD 05/06/2023 7:31 AM  **Pharmacist phone directory can be found on [email or web address] listed under [facility]**

## 2023-05-06 NOTE — Progress Notes (Signed)
PROGRESS NOTE        PATIENT DETAILS Name: Kathleen [name] Age: 77 y.o. Sex: female Date of Birth: 04-Aug-1945 Admit Date: 04/22/2023 Admitting Physician Gery Pray, MD [id number], Bernadene Bell, MD  Brief Summary: Patient is a 77 y.o.  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  Admitted by [facility]-started on empiric IV antibiotics-underwent preop assessment by cardiology-and then subsequently underwent  exploratory laparotomy with sigmoid colectomy and end ostomy on 11/19.  Postoperative course complicated by ileus-requiring NG tube decompression-and TNA for nutrition.  See below for further details.  Significant events: 11/14>> admit to [facility] 11/19>>exploratory laparotomy with sigmoid colectomy and end ostomy   Significant studies: 11/14>> CT abdomen/pelvis: Worsening signal right diverticulitis-localized perforation-5.6 cm perirectal abscess, 6.0 cm left lower quadrant abscess. 11/22>> x-ray abdomen: Consistent with postoperative ileus. 11/25>> CT abdomen: No abscess  Significant microbiology data: 11/14>> blood culture: No growth 11/14>> COVID/influenza/RSV PCR: Negative  Procedures: 11/19>>exploratory laparotomy with sigmoid colectomy  end ostomy on 11/19  Consults: Cardiology GI General surgery  Subjective: Lying comfortably in bed-feels overall better-no vomiting.  Objective: Vitals: Blood pressure (!) 121/54, pulse 71, temperature 98.2 F (36.8 C), temperature source Oral, resp. rate 19, height 5' (1.524 m), weight 85.3 kg, SpO2 97%.   Exam: Gen Exam:Alert awake-not in any distress HEENT:atraumatic, normocephalic Chest: Some transmitted upper airway sounds CVS:S1S2 regular Abdomen:soft non tender, no stool in ostomy Extremities:no  edema Neurology: Non focal Skin: no rash  Pertinent Labs/Radiology:    Latest Ref Rng & Units 05/06/2023    3:46 AM 05/05/2023    1:02 AM 05/04/2023    4:48 AM  CBC  WBC 4.0 - 10.5 K/uL 16.1  21.9  21.5   Hemoglobin 12.0 - 15.0 g/dL 8.6  8.3  7.9   Hematocrit 36.0 - 46.0 % 29.9  27.1  26.4   Platelets 150 - 400 K/uL 351  336  355     Lab Results  Component Value Date   NA 130 (L) 05/06/2023   K 3.9 05/06/2023   CL 103 05/06/2023   CO2 20 (L) 05/06/2023     Assessment/Plan: 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 with localized perforation and pelvic abscess-s/p ex lap with colectomy/ostomy on 11/19-complicated by postoperative ileus requiring NG tube decompression Essentially unchanged Await recovery of bowel function-no stool in ostomy yet CT abdomen 11/25 without abscess-leukocytosis slowly downtrending-suspect this is reactive-Zosyn EOT 11/28.   TNA per pharmacy General Surgery following Encourage incentive spirometry/flutter valve Mobilize with PT/OT Out of bed to chair  Hypokalemia/hypophosphatemia Repleted  Normocytic anemia Due to critical illness-HPI to be stable Follow closely-no evidence of blood loss.    Persistent atrial fibrillation Telemetry monitoring Tikosyn orally-okay to clamp NG tube for a few minutes IV heparin restarted 11/23-some mild drop in Hb but no overt GI bleeding noted. Attempt to keep K> 4  History of AV nodal ablation-s/p PPM ([employer]) Telemetry monitoring  Chronic HFrEF (EF 35-40% on 4/9 by TTE) Volume status stable Given IV Lasix for the past several days to ensure negative balance-hold IV Lasix today Aldactone on hold-resume when oral intake is stable  Chronic hypoxic respiratory failure on home O2 COPD BOOP Stable Pulmonary tolerating with incentive spirometry/flutter valve Mobilization with PT/OT Continue  bronchodilators Incentive spirometry/flutter valve Out of bed to  chair.  GERD PPI  DM-2 (A1c 6.2 on 11/15) CBGs stable on SSI  Recent Labs    05/06/23 0004 05/06/23 0344 05/06/23 0803  GLUCAP 143* 160* 123*   Nutrition Status: Nutrition Problem: Moderate Malnutrition Etiology: chronic illness Signs/Symptoms: mild fat depletion, mild muscle depletion, energy intake < 75% for > 7 days Interventions: Ensure Enlive (each supplement provides 350kcal and 20 grams of protein), Magic cup, MVI  Obesity: Estimated body mass index is 36.73 kg/m as calculated from the following:   Height as of this encounter: 5' (1.524 m).   Weight as of this encounter: 85.3 kg.   Code status:   Code Status: Limited: Do not attempt resuscitation (DNR) -DNR-LIMITED -Do Not Intubate/DNI    DVT Prophylaxis: IV heparin   Family Communication: None at bedside.   Disposition Plan: Status is: Inpatient Remains inpatient appropriate because: Severity of illness   Planned Discharge Destination:Skilled nursing facility   Diet: Diet Order             Diet NPO time specified Except for: Ice Chips  Diet effective now                     Antimicrobial agents: Anti-infectives (From admission, onward)    Start     Dose/Rate Route Frequency Ordered Stop   05/03/23 1400  piperacillin-tazobactam (ZOSYN) IVPB 3.375 g        3.375 g 12.5 mL/hr over 240 Minutes Intravenous Every 8 hours 05/03/23 0935 05/06/23 2359   04/27/23 1021  sodium chloride 0.9 % with cefoTEtan (CEFOTAN) ADS Med       Note to Pharmacy: Shanda Bumps M: cabinet override      04/27/23 1021 04/27/23 1253   04/26/23 0800  cefoTEtan (CEFOTAN) 2 g in sodium chloride 0.9 % 100 mL IVPB  Status:  Discontinued        2 g 200 mL/hr over 30 Minutes Intravenous To [facility] 04/23/23 2111 04/27/23 0800   04/22/23 2300  piperacillin-tazobactam (ZOSYN) IVPB 3.375 g        3.375 g 12.5 mL/hr over 240 Minutes Intravenous Every 8 hours 04/22/23 2256 05/02/23 2359   04/22/23 1900   piperacillin-tazobactam (ZOSYN) IVPB 3.375 g        3.375 g 100 mL/hr over 30 Minutes Intravenous  Once 04/22/23 1852 04/22/23 1954        MEDICATIONS: Scheduled Meds:  Chlorhexidine Gluconate Cloth  6 each Topical Daily   dofetilide  250 mcg Oral Q12H   fluticasone furoate-vilanterol  1 puff Inhalation Daily   insulin aspart  0-15 Units Subcutaneous Q4H   ipratropium-albuterol  3 mL Nebulization BID   lidocaine  1 patch Transdermal Q24H   methocarbamol (ROBAXIN) injection  500 mg Intravenous Q6H   mouth rinse  15 mL Mouth Rinse 4 times per day   pantoprazole (PROTONIX) IV  40 mg Intravenous Q12H   revefenacin  175 mcg Nebulization Daily   Continuous Infusions:  TPN (CLINIMIX-E) Adult     And   fat emul(SMOFlipid)     heparin 1,900 Units/hr (05/06/23 0700)   piperacillin-tazobactam (ZOSYN)  IV 12.5 mL/hr at 05/06/23 0700   potassium chloride 10 mEq (05/06/23 0930)   promethazine (PHENERGAN) injection (IM or IVPB) Stopped (05/06/23 0334)   TPN (CLINIMIX-E) Adult 42 mL/hr at 05/06/23 0700   PRN Meds:.albuterol, guaiFENesin-dextromethorphan, HYDROmorphone (DILAUDID) injection, ondansetron (ZOFRAN) IV, promethazine (PHENERGAN) injection (IM or IVPB), sodium chloride flush  I have personally reviewed following labs and imaging studies  LABORATORY DATA: CBC: Recent Labs  Lab 05/02/23 0359 05/03/23 0604 05/04/23 0448 05/05/23 0102 05/06/23 0346  WBC 18.1* 23.3* 21.5* 21.9* 16.1*  HGB 8.9* 8.8* 7.9* 8.3* 8.6*  HCT 29.8* 28.5* 26.4* 27.1* 29.9*  MCV 85.9 85.1 84.3 83.9 89.0  PLT 412* 388 355 336 351    Basic Metabolic Panel: Recent Labs  Lab 05/02/23 0359 05/03/23 0604 05/04/23 0448 05/05/23 0102 05/06/23 0346  NA 136 133* 132* 133* 130*  K 3.3* 3.7 3.5 3.8 3.9  CL 101 99 100 102 103  CO2 25 24 24  21* 20*  GLUCOSE 171* 219* 191* 153* 173*  BUN 18 23 21 22 19   CREATININE 0.64 0.78 0.62 0.71 0.68  CALCIUM 8.0* 8.3* 8.3* 8.4* 8.2*  MG 2.4 2.2 2.3 2.2 2.2   PHOS 2.1* 2.3* 3.3 3.2 3.1    GFR: Estimated Creatinine Clearance: 58 mL/min (by C-G formula based on SCr of 0.68 mg/dL).  Liver Function Tests: Recent Labs  Lab 05/02/23 0359 05/03/23 0604 05/04/23 0448 05/05/23 0102 05/06/23 0346  AST 12* 14* 15 15 12*  ALT 11 12 11 12 11   ALKPHOS 47 44 47 51 48  BILITOT 0.4 0.4 0.5 0.5 0.4  PROT 5.2* 5.2* 5.1* 5.4* 5.4*  ALBUMIN 2.1* 2.1* 2.0* 2.2* 2.2*   No results for input(s): "LIPASE", "AMYLASE" in the last 168 hours. No results for input(s): "AMMONIA" in the last 168 hours.  Coagulation Profile: No results for input(s): "INR", "PROTIME" in the last 168 hours.  Cardiac Enzymes: No results for input(s): "CKTOTAL", "CKMB", "CKMBINDEX", "TROPONINI" in the last 168 hours.  BNP (last 3 results) No results for input(s): "PROBNP" in the last 8760 hours.  Lipid Profile: No results for input(s): "CHOL", "HDL", "LDLCALC", "TRIG", "CHOLHDL", "LDLDIRECT" in the last 72 hours.   Thyroid Function Tests: No results for input(s): "TSH", "T4TOTAL", "FREET4", "T3FREE", "THYROIDAB" in the last 72 hours.  Anemia Panel: No results for input(s): "VITAMINB12", "FOLATE", "FERRITIN", "TIBC", "IRON", "RETICCTPCT" in the last 72 hours.  Urine analysis:    Component Value Date/Time   COLORURINE AMBER (A) 04/23/2023 0623   APPEARANCEUR CLOUDY (A) 04/23/2023 0623   APPEARANCEUR Hazy 12/25/2012 1653   LABSPEC 1.024 04/23/2023 0623   LABSPEC 1.021 12/25/2012 1653   PHURINE 5.0 04/23/2023 0623   GLUCOSEU NEGATIVE 04/23/2023 0623   GLUCOSEU NEGATIVE 03/17/2023 1003   HGBUR MODERATE (A) 04/23/2023 0623   BILIRUBINUR NEGATIVE 04/23/2023 0623   BILIRUBINUR Negative 12/25/2012 1653   KETONESUR NEGATIVE 04/23/2023 0623   PROTEINUR 30 (A) 04/23/2023 0623   UROBILINOGEN 0.2 03/17/2023 1003   NITRITE NEGATIVE 04/23/2023 0623   LEUKOCYTESUR LARGE (A) 04/23/2023 0623   LEUKOCYTESUR Negative 12/25/2012 1653    Sepsis Labs: Lactic Acid, Venous     Component Value Date/Time   LATICACIDVEN 0.8 04/23/2023 0114    MICROBIOLOGY: Recent Results (from the past 240 hour(s))  Surgical PCR screen     Status: None   Collection Time: 04/27/23 12:21 AM   Specimen: Nasal Mucosa; Nasal Swab  Result Value Ref Range Status   MRSA, PCR NEGATIVE NEGATIVE Final   Staphylococcus aureus NEGATIVE NEGATIVE Final    Comment: (NOTE) The Xpert SA Assay (FDA approved for NASAL specimens in patients 20 years of age and older), is one component of a comprehensive surveillance program. It is not intended to diagnose infection nor to guide or monitor treatment. Performed at [facility], [address], [place],  [place] [address]     RADIOLOGY STUDIES/RESULTS: No results found.   LOS: 14 days   Jeoffrey Massed, MD  [facility]    To contact the attending provider between 7A-7P or the covering provider during after hours 7P-7A, please log into the web site [email or web address] and access using universal  password for that web site. If you do not have the password, please call the hospital operator.  05/06/2023, 10:36 AM

## 2023-05-06 NOTE — Progress Notes (Signed)
9 Days Post-Op   Subjective/Chief Complaint: About the same  No ostomy output    Objective: Vital signs in last 24 hours: Temp:  [97.5 F (36.4 C)-98.2 F (36.8 C)] 98.2 F (36.8 C) (11/28 0801) Pulse Rate:  [71-88] 71 (11/28 0801) Resp:  [15-23] 19 (11/28 0801) BP: (108-124)/(53-81) 121/54 (11/28 0801) SpO2:  [95 %-99 %] 97 % (11/28 0856) Weight:  [85.3 kg] 85.3 kg (11/28 0500) Last BM Date : 04/26/23  Intake/Output from previous day: 11/27 0701 - 11/28 0700 In: 2582.3 [I.V.:2278.5; IV Piggyback:303.8] Out: 2675 [Urine:1150; Emesis/NG output:1500; Drains:25] Intake/Output this shift: Total I/O In: -  Out: 1500 [Urine:400; Emesis/NG output:1100]    Lab Results:  Gen:  Alert, NAD Abd: soft, mild distension, generalized tenderness, drain x2 serosanguinous, ostomy pale pink with no stool in bag, open midline pale pink with no erythema or purulent drainage/ fascia intact  BMET Recent Labs    05/05/23 0102 05/06/23 0346  NA 133* 130*  K 3.8 3.9  CL 102 103  CO2 21* 20*  GLUCOSE 153* 173*  BUN 22 19  CREATININE 0.71 0.68  CALCIUM 8.4* 8.2*   PT/INR No results for input(s): "LABPROT", "INR" in the last 72 hours. ABG No results for input(s): "PHART", "HCO3" in the last 72 hours.  Invalid input(s): "PCO2", "PO2"  Studies/Results: No results found.  Anti-infectives: Anti-infectives (From admission, onward)    Start     Dose/Rate Route Frequency Ordered Stop   05/03/23 1400  piperacillin-tazobactam (ZOSYN) IVPB 3.375 g        3.375 g 12.5 mL/hr over 240 Minutes Intravenous Every 8 hours 05/03/23 0935 05/06/23 2359   04/27/23 1021  sodium chloride 0.9 % with cefoTEtan (CEFOTAN) ADS Med       Note to Pharmacy: Shanda Bumps M: cabinet override      04/27/23 1021 04/27/23 1253   04/26/23 0800  cefoTEtan (CEFOTAN) 2 g in sodium chloride 0.9 % 100 mL IVPB  Status:  Discontinued        2 g 200 mL/hr over 30 Minutes Intravenous To ShortStay Surgical 04/23/23  2111 04/27/23 0800   04/22/23 2300  piperacillin-tazobactam (ZOSYN) IVPB 3.375 g        3.375 g 12.5 mL/hr over 240 Minutes Intravenous Every 8 hours 04/22/23 2256 05/02/23 2359   04/22/23 1900  piperacillin-tazobactam (ZOSYN) IVPB 3.375 g        3.375 g 100 mL/hr over 30 Minutes Intravenous  Once 04/22/23 1852 04/22/23 1954       Assessment/Plan: s/p Procedure(s): EXPLORATORY LAPAROTOMY (N/A) COLON RESECTION SIGMOID WITH COLOSTOMY (N/A) APPLICATION OF WOUND VAC POD #9 elap, sigmoid colectomy, colostomy 11/19- MW - CT 11/25 with ileus, no abscess - Continue NPO/NGT to LIWS and await return in bowel function - oob, pulm toilet - vac removed  Also following for new ostomy. Will need ostomy clinic referral at discharge - Continue PICC/TPN 11/22 --> - continue JP drain x2 - currently serosanguinous - discussed further leukocytosis w/u with primary team, will start with CXR. Patient does not need any more antibiotics at this point from surgical standpoint - K pad and lidocaine patches for back pain - continue IV tylenol   ID zosyn 11/14>> VTE - heparin heparin gtt for afib  FEN - TPN, NPO/NGT to LIWS  LOS: 14 days    Dortha Schwalbe MD  05/06/2023

## 2023-05-07 ENCOUNTER — Inpatient Hospital Stay (HOSPITAL_COMMUNITY): Payer: Medicare Other

## 2023-05-07 LAB — GLUCOSE, CAPILLARY
Glucose-Capillary: 125 mg/dL — ABNORMAL HIGH (ref 70–99)
Glucose-Capillary: 130 mg/dL — ABNORMAL HIGH (ref 70–99)
Glucose-Capillary: 131 mg/dL — ABNORMAL HIGH (ref 70–99)
Glucose-Capillary: 132 mg/dL — ABNORMAL HIGH (ref 70–99)
Glucose-Capillary: 132 mg/dL — ABNORMAL HIGH (ref 70–99)
Glucose-Capillary: 146 mg/dL — ABNORMAL HIGH (ref 70–99)
Glucose-Capillary: 162 mg/dL — ABNORMAL HIGH (ref 70–99)

## 2023-05-07 LAB — BASIC METABOLIC PANEL
Anion gap: 7 (ref 5–15)
BUN: 21 mg/dL (ref 8–23)
CO2: 18 mmol/L — ABNORMAL LOW (ref 22–32)
Calcium: 8.4 mg/dL — ABNORMAL LOW (ref 8.9–10.3)
Chloride: 108 mmol/L (ref 98–111)
Creatinine, Ser: 0.53 mg/dL (ref 0.44–1.00)
GFR, Estimated: >60 mL/min (ref >60)
Glucose, Bld: 165 mg/dL — ABNORMAL HIGH (ref 70–99)
Potassium: 4.2 mmol/L (ref 3.5–5.1)
Sodium: 133 mmol/L — ABNORMAL LOW (ref 135–145)

## 2023-05-07 LAB — CBC
HCT: 27.4 % — ABNORMAL LOW (ref 36.0–46.0)
Hemoglobin: 8.1 g/dL — ABNORMAL LOW (ref 12.0–15.0)
MCH: 25.3 pg — ABNORMAL LOW (ref 26.0–34.0)
MCHC: 29.6 g/dL — ABNORMAL LOW (ref 30.0–36.0)
MCV: 85.6 fL (ref 80.0–100.0)
Platelets: 336 10*3/uL (ref 150–400)
RBC: 3.2 MIL/uL — ABNORMAL LOW (ref 3.87–5.11)
RDW: 17.2 % — ABNORMAL HIGH (ref 11.5–15.5)
WBC: 17.5 10*3/uL — ABNORMAL HIGH (ref 4.0–10.5)
nRBC: 0.3 % — ABNORMAL HIGH (ref 0.0–0.2)

## 2023-05-07 LAB — HEPARIN LEVEL (UNFRACTIONATED): Heparin Unfractionated: 0.49 [IU]/mL (ref 0.30–0.70)

## 2023-05-07 MED ORDER — GABAPENTIN 100 MG PO CAPS
200.0000 mg | ORAL_CAPSULE | Freq: Every day | ORAL | Status: DC
  Administered 2023-05-07 – 2023-05-26 (×20): 200 mg via ORAL
  Filled 2023-05-07 (×20): qty 2

## 2023-05-07 MED ORDER — FUROSEMIDE 10 MG/ML IJ SOLN
20.0000 mg | Freq: Once | INTRAMUSCULAR | Status: AC
  Administered 2023-05-07: 20 mg via INTRAVENOUS
  Filled 2023-05-07: qty 2

## 2023-05-07 MED ORDER — DEXTROSE 10 % IV SOLN: INTRAVENOUS | Status: DC

## 2023-05-07 MED ORDER — TRACE MINERALS CU-MN-SE-ZN 300-55-60-3000 MCG/ML IV SOLN
INTRAVENOUS | Status: AC
  Filled 2023-05-07 (×2): qty 1000

## 2023-05-07 MED ORDER — FAT EMUL FISH OIL/PLANT BASED 20% (SMOFLIPID)IV EMUL
250.0000 mL | INTRAVENOUS | Status: AC
  Administered 2023-05-07: 250 mL via INTRAVENOUS
  Filled 2023-05-07: qty 250

## 2023-05-07 MED ORDER — HYDROMORPHONE HCL 1 MG/ML IJ SOLN
1.0000 mg | INTRAMUSCULAR | Status: DC | PRN
  Administered 2023-05-07 – 2023-05-13 (×36): 1 mg via INTRAVENOUS
  Filled 2023-05-07 (×37): qty 1

## 2023-05-07 NOTE — Progress Notes (Signed)
PHARMACY - ANTICOAGULATION CONSULT NOTE  Pharmacy Consult for Heparin gtt Indication: atrial fibrillation  Patient Measurements: Height: 5' (152.4 cm) Weight: 85.3 kg (188 lb 0.8 oz) IBW/kg (Calculated) : 45.5 Heparin Dosing Weight: 64 kg  Vital Signs: Temp: 97.6 F (36.4 C) (11/29 0355) Temp Source: Oral (11/29 0355) BP: 115/54 (11/29 0355) Pulse Rate: 72 (11/29 0355)  Labs: Recent Labs    05/05/23 0102 05/05/23 0953 05/06/23 0346 05/07/23 0413  HGB 8.3*  --  8.6* 8.1*  HCT 27.1*  --  29.9* 27.4*  PLT 336  --  351 336  HEPARINUNFRC 0.52 0.50 0.41 0.49  CREATININE 0.71  --  0.68 0.53   Estimated Creatinine Clearance: 58 mL/min (by C-G formula based on SCr of 0.53 mg/dL).  Assessment: [name] with history of atrial fibrillation on Xarelto prior to admission. Last dose of Xarelto 04/21/23 at 1900. Admitted for recurrent diverticulitis, perforation, and perirectal abscess. 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 Cleared by surgery to start hep gtt for afib.  Heparin level remains therapeutic at 0.49 on heparin 1900 units/hr. No problems with the infusion or bleeding reported per RN. CBC low but stable.   Goal of Therapy:  Heparin level 0.3-0.7 units/ml Monitor platelets by anticoagulation protocol: Yes   Plan:  No bolus per surgery Continue heparin at 1900 units/hr Heparin level daily CBC daily  Monitor for signs/symptoms of bleeding   Thank you for allowing pharmacy to be a part of this patient's care.   Blane Ohara, PharmD, BCPS  PGY2 Pharmacy Resident     **Pharmacist phone directory can be found on [email or web address] listed under [facility]**

## 2023-05-07 NOTE — Progress Notes (Signed)
PROGRESS NOTE        PATIENT DETAILS Name: Kathleen [name] Age: 77 y.o. Sex: female Date of Birth: 1945/10/30 Admit Date: 04/22/2023 Admitting Physician Gery Pray, MD JYN:WGNFA, Bernadene Bell, MD  Brief Summary: Patient is a 77 y.o.  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  Admitted by [facility]-started on empiric IV antibiotics-underwent preop assessment by cardiology-and then subsequently underwent  exploratory laparotomy with sigmoid colectomy and end ostomy on 11/19.  Postoperative course complicated by ileus-requiring NG tube decompression-and TNA for nutrition.  See below for further details.  Significant events: 11/14>> admit to [facility] 11/19>>exploratory laparotomy with sigmoid colectomy and end ostomy   Significant studies: 11/14>> CT abdomen/pelvis: Worsening signal right diverticulitis-localized perforation-5.6 cm perirectal abscess, 6.0 cm left lower quadrant abscess. 11/22>> x-ray abdomen: Consistent with postoperative ileus. 11/25>> CT abdomen: No abscess  Significant microbiology data: 11/14>> blood culture: No growth 11/14>> COVID/influenza/RSV PCR: Negative  Procedures: 11/19>>exploratory laparotomy with sigmoid colectomy  end ostomy on 11/19  Consults: Cardiology GI General surgery  Subjective: Complains of pain in her ostomy site.  Some  minimal blood-tinged fluid in ostomy.  Objective: Vitals: Blood pressure (!) 147/77, pulse 82, temperature 97.9 F (36.6 C), temperature source Oral, resp. rate 15, height 5' (1.524 m), weight 85.3 kg, SpO2 100%.   Exam: Gen Exam:Alert awake-not in any distress HEENT:atraumatic, normocephalic Chest: Few scattered rhonchi. CVS:S1S2 regular Abdomen:soft non tender, non distended-no stool in  ostomy. 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   TNA per pharmacy General Surgery following Encourage incentive spirometry/flutter valve Mobilize with PT/OT Out of bed to chair  Hypokalemia/hypophosphatemia Repleted  Normocytic anemia Due to critical illness-Hb fluctuating but relatively stable Minimal bloody fluid in ostomy-monitor for now-continue with IV heparin cautiously. Follow CBC.  Persistent atrial fibrillation Telemetry monitoring Tikosyn orally-okay to clamp NG tube for a few minutes Cautiously continue with IV heparin-see above. 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   Aldactone on hold-resume when oral intake is stable  Chronic hypoxic respiratory failure on home O2 COPD BOOP Stable Pulmonary tolerating with incentive spirometry/flutter valve Mobilization with  PT/OT Continue bronchodilators Incentive spirometry/flutter  valve Out of bed to chair.  GERD PPI  DM-2 (A1c 6.2 on 11/15) CBGs stable on SSI  Recent Labs    05/07/23 0032 05/07/23 0348 05/07/23 0803  GLUCAP 146* 162* 132*   Nutrition Status: Nutrition Problem: Moderate Malnutrition Etiology: chronic illness Signs/Symptoms: mild fat depletion, mild muscle depletion, energy intake < 75% for > 7 days Interventions: Ensure Enlive (each supplement provides 350kcal and 20 grams of protein), Magic cup, MVI  Obesity: Estimated body mass index is 36.73 kg/m as calculated from the following:   Height as of this encounter: 5' (1.524 m).   Weight as of this encounter: 85.3 kg.   Code status:   Code Status: Limited: Do not attempt resuscitation (DNR) -DNR-LIMITED -Do Not Intubate/DNI    DVT Prophylaxis: IV heparin   Family Communication: None at bedside.   Disposition Plan: Status is: Inpatient Remains inpatient appropriate because: Severity of illness   Planned Discharge Destination:Skilled nursing facility   Diet: Diet Order             Diet NPO time specified Except for: Ice Chips  Diet effective now                     Antimicrobial agents: Anti-infectives (From admission, onward)    Start     Dose/Rate Route Frequency Ordered Stop   05/03/23 1400  piperacillin-tazobactam (ZOSYN) IVPB 3.375 g        3.375 g 12.5 mL/hr over 240 Minutes Intravenous Every 8 hours 05/03/23 0935 05/07/23 0124   04/27/23 1021  sodium chloride 0.9 % with cefoTEtan (CEFOTAN) ADS Med       Note to Pharmacy: Shanda Bumps M: cabinet override      04/27/23 1021 04/27/23 1253   04/26/23 0800  cefoTEtan (CEFOTAN) 2 g in sodium chloride 0.9 % 100 mL IVPB  Status:  Discontinued        2 g 200 mL/hr over 30 Minutes Intravenous To ShortStay Surgical 04/23/23 2111 04/27/23 0800   04/22/23 2300  piperacillin-tazobactam (ZOSYN) IVPB 3.375 g        3.375 g 12.5 mL/hr over 240 Minutes Intravenous Every 8 hours 04/22/23 2256 05/02/23 2359    04/22/23 1900  piperacillin-tazobactam (ZOSYN) IVPB 3.375 g        3.375 g 100 mL/hr over 30 Minutes Intravenous  Once 04/22/23 1852 04/22/23 1954        MEDICATIONS: Scheduled Meds:  Chlorhexidine Gluconate Cloth  6 each Topical Daily   dofetilide  250 mcg Oral Q12H   fluticasone furoate-vilanterol  1 puff Inhalation Daily   gabapentin  200 mg Oral QHS   insulin aspart  0-15 Units Subcutaneous Q4H   lidocaine  1 patch Transdermal Q24H   methocarbamol (ROBAXIN) injection  500 mg Intravenous Q6H   mouth rinse  15 mL Mouth Rinse 4 times per day   pantoprazole (PROTONIX) IV  40 mg Intravenous Q12H   revefenacin  175 mcg Nebulization Daily   Continuous Infusions:  dextrose     TPN (CLINIMIX-E) Adult     And   fat emul(SMOFlipid)     heparin 1,900 Units/hr (05/07/23 0827)   promethazine (PHENERGAN) injection (IM or IVPB) Stopped (05/06/23 0334)   TPN (CLINIMIX-E) Adult 42 mL/hr at 05/06/23 1749   PRN Meds:.albuterol, guaiFENesin-dextromethorphan, HYDROmorphone (DILAUDID) injection, ondansetron (ZOFRAN) IV, promethazine (PHENERGAN) injection (IM or IVPB), sodium chloride flush   I have personally reviewed following labs and imaging studies  LABORATORY  DATA: CBC: Recent Labs  Lab 05/03/23 0604 05/04/23 0448 05/05/23 0102 05/06/23 0346 05/07/23 0413  WBC 23.3* 21.5* 21.9* 16.1* 17.5*  HGB 8.8* 7.9* 8.3* 8.6* 8.1*  HCT 28.5* 26.4* 27.1* 29.9* 27.4*  MCV 85.1 84.3 83.9 89.0 85.6  PLT 388 355 336 351 336    Basic Metabolic Panel: Recent Labs  Lab 05/02/23 0359 05/03/23 0604 05/04/23 0448 05/05/23 0102 05/06/23 0346 05/07/23 0413  NA 136 133* 132* 133* 130* 133*  K 3.3* 3.7 3.5 3.8 3.9 4.2  CL 101 99 100 102 103 108  CO2 25 24 24  21* 20* 18*  GLUCOSE 171* 219* 191* 153* 173* 165*  BUN 18 23 21 22 19 21   CREATININE 0.64 0.78 0.62 0.71 0.68 0.53  CALCIUM 8.0* 8.3* 8.3* 8.4* 8.2* 8.4*  MG 2.4 2.2 2.3 2.2 2.2  --   PHOS 2.1* 2.3* 3.3 3.2 3.1  --      GFR: Estimated Creatinine Clearance: 58 mL/min (by C-G formula based on SCr of 0.53 mg/dL).  Liver Function Tests: Recent Labs  Lab 05/02/23 0359 05/03/23 0604 05/04/23 0448 05/05/23 0102 05/06/23 0346  AST 12* 14* 15 15 12*  ALT 11 12 11 12 11   ALKPHOS 47 44 47 51 48  BILITOT 0.4 0.4 0.5 0.5 0.4  PROT 5.2* 5.2* 5.1* 5.4* 5.4*  ALBUMIN 2.1* 2.1* 2.0* 2.2* 2.2*   No results for input(s): "LIPASE", "AMYLASE" in the last 168 hours. No results for input(s): "AMMONIA" in the last 168 hours.  Coagulation Profile: No results for input(s): "INR", "PROTIME" in the last 168 hours.  Cardiac Enzymes: No results for input(s): "CKTOTAL", "CKMB", "CKMBINDEX", "TROPONINI" in the last 168 hours.  BNP (last 3 results) No results for input(s): "PROBNP" in the last 8760 hours.  Lipid Profile: No results for input(s): "CHOL", "HDL", "LDLCALC", "TRIG", "CHOLHDL", "LDLDIRECT" in the last 72 hours.   Thyroid Function Tests: No results for input(s): "TSH", "T4TOTAL", "FREET4", "T3FREE", "THYROIDAB" in the last 72 hours.  Anemia Panel: No results for input(s): "VITAMINB12", "FOLATE", "FERRITIN", "TIBC", "IRON", "RETICCTPCT" in the last 72 hours.  Urine analysis:    Component Value Date/Time   COLORURINE AMBER (A) 04/23/2023 0623   APPEARANCEUR CLOUDY (A) 04/23/2023 0623   APPEARANCEUR Hazy 12/25/2012 1653   LABSPEC 1.024 04/23/2023 0623   LABSPEC 1.021 12/25/2012 1653   PHURINE 5.0 04/23/2023 0623   GLUCOSEU NEGATIVE 04/23/2023 0623   GLUCOSEU NEGATIVE 03/17/2023 1003   HGBUR MODERATE (A) 04/23/2023 0623   BILIRUBINUR NEGATIVE 04/23/2023 0623   BILIRUBINUR Negative 12/25/2012 1653   KETONESUR NEGATIVE 04/23/2023 0623   PROTEINUR 30 (A) 04/23/2023 0623   UROBILINOGEN 0.2 03/17/2023 1003   NITRITE NEGATIVE 04/23/2023 0623   LEUKOCYTESUR LARGE (A) 04/23/2023 0623   LEUKOCYTESUR Negative 12/25/2012 1653    Sepsis Labs: Lactic Acid, Venous    Component Value Date/Time    LATICACIDVEN 0.8 04/23/2023 0114    MICROBIOLOGY: No results found for this or any previous visit (from the past 240 hour(s)).   RADIOLOGY STUDIES/RESULTS: No results found.   LOS: 15 days   Jeoffrey Massed, MD  [facility]    To contact the attending provider between 7A-7P or the covering provider during after hours 7P-7A, please log into the web site [email or web address] and access using universal [place] password for that web site. If you do not have the password, please call the hospital operator.  05/07/2023, 11:05 AM

## 2023-05-07 NOTE — Plan of Care (Signed)
  Problem: Education: Goal: Ability to describe self-care measures that may prevent or decrease complications (Diabetes Survival Skills Education) will improve Outcome: Progressing Goal: Individualized Educational Video(s) Outcome: Progressing   Problem: Coping: Goal: Ability to adjust to condition or change in health will improve Outcome: Progressing   Problem: Fluid Volume: Goal: Ability to maintain a balanced intake and output will improve Outcome: Progressing   Problem: Health Behavior/Discharge Planning: Goal: Ability to identify and utilize available resources and services will improve Outcome: Progressing Goal: Ability to manage health-related needs will improve Outcome: Progressing   Problem: Metabolic: Goal: Ability to maintain appropriate glucose levels will improve Outcome: Progressing   Problem: Nutritional: Goal: Maintenance of adequate nutrition will improve Outcome: Progressing Goal: Progress toward achieving an optimal weight will improve Outcome: Progressing   Problem: Skin Integrity: Goal: Risk for impaired skin integrity will decrease Outcome: Progressing   Problem: Tissue Perfusion: Goal: Adequacy of tissue perfusion will improve Outcome: Progressing   Problem: Education: Goal: Knowledge of General Education information will improve Description: Including pain rating scale, medication(s)/side effects and non-pharmacologic comfort measures Outcome: Progressing   Problem: Health Behavior/Discharge Planning: Goal: Ability to manage health-related needs will improve Outcome: Progressing   Problem: Clinical Measurements: Goal: Ability to maintain clinical measurements within normal limits will improve Outcome: Progressing Goal: Will remain free from infection Outcome: Progressing Goal: Diagnostic test results will improve Outcome: Progressing Goal: Cardiovascular complication will be avoided Outcome: Progressing   Problem: Activity: Goal: Risk  for activity intolerance will decrease Outcome: Progressing   Problem: Nutrition: Goal: Adequate nutrition will be maintained Outcome: Progressing   Problem: Coping: Goal: Level of anxiety will decrease Outcome: Progressing   Problem: Elimination: Goal: Will not experience complications related to bowel motility Outcome: Progressing Goal: Will not experience complications related to urinary retention Outcome: Progressing   Problem: Pain Management: Goal: General experience of comfort will improve Outcome: Progressing   Problem: Safety: Goal: Ability to remain free from injury will improve Outcome: Progressing   Problem: Skin Integrity: Goal: Risk for impaired skin integrity will decrease Outcome: Progressing   Problem: Education: Goal: Understanding of discharge needs will improve Outcome: Progressing Goal: Verbalization of understanding of the causes of altered bowel function will improve Outcome: Progressing   Problem: Activity: Goal: Ability to tolerate increased activity will improve Outcome: Progressing   Problem: Bowel/Gastric: Goal: Gastrointestinal status for postoperative course will improve Outcome: Progressing   Problem: Health Behavior/Discharge Planning: Goal: Identification of community resources to assist with postoperative recovery needs will improve Outcome: Progressing   Problem: Nutritional: Goal: Will attain and maintain optimal nutritional status will improve Outcome: Progressing   Problem: Clinical Measurements: Goal: Postoperative complications will be avoided or minimized Outcome: Progressing   Problem: Respiratory: Goal: Respiratory status will improve Outcome: Progressing   Problem: Skin Integrity: Goal: Will show signs of wound healing Outcome: Progressing

## 2023-05-07 NOTE — Progress Notes (Signed)
10 Days Post-Op   Subjective/Chief Complaint: Patient with pain overnight.  Most of her pain is near where the ostomy is.  She has a little bit of bleeding from the ostomy as well.  Her pain is improved this morning.  Still no significant output in her ostomy.  Pain and nausea are biggest issues but these are controlled   Objective: Vital signs in last 24 hours: Temp:  [97.6 F (36.4 C)-98.3 F (36.8 C)] 97.9 F (36.6 C) (11/29 0800) Pulse Rate:  [72-83] 82 (11/29 0800) Resp:  [15-20] 15 (11/29 0800) BP: (100-147)/(54-77) 147/77 (11/29 0800) SpO2:  [97 %-100 %] 100 % (11/29 0800) Last BM Date : 04/26/23  Intake/Output from previous day: 11/28 0701 - 11/29 0700 In: 443 [I.V.:443] Out: 1925 [Urine:400; Emesis/NG output:1500; Drains:25] Intake/Output this shift: Total I/O In: 482 [I.V.:482] Out: 950 [Emesis/NG output:950]  Abdomen: Ostomy is intact and viable.  There is some blood in the bag.  This appears to be old.  Wound is examined and open but intact and clean.  No evidence of fascial separation.  Lab Results:  Recent Labs    05/06/23 0346 05/07/23 0413  WBC 16.1* 17.5*  HGB 8.6* 8.1*  HCT 29.9* 27.4*  PLT 351 336   BMET Recent Labs    05/06/23 0346 05/07/23 0413  NA 130* 133*  K 3.9 4.2  CL 103 108  CO2 20* 18*  GLUCOSE 173* 165*  BUN 19 21  CREATININE 0.68 0.53  CALCIUM 8.2* 8.4*   PT/INR No results for input(s): "LABPROT", "INR" in the last 72 hours. ABG No results for input(s): "PHART", "HCO3" in the last 72 hours.  Invalid input(s): "PCO2", "PO2"  Studies/Results: No results found.  Anti-infectives: Anti-infectives (From admission, onward)    Start     Dose/Rate Route Frequency Ordered Stop   05/03/23 1400  piperacillin-tazobactam (ZOSYN) IVPB 3.375 g        3.375 g 12.5 mL/hr over 240 Minutes Intravenous Every 8 hours 05/03/23 0935 05/07/23 0124   04/27/23 1021  sodium chloride 0.9 % with cefoTEtan (CEFOTAN) ADS Med       Note to  Pharmacy: Shanda Bumps M: cabinet override      04/27/23 1021 04/27/23 1253   04/26/23 0800  cefoTEtan (CEFOTAN) 2 g in sodium chloride 0.9 % 100 mL IVPB  Status:  Discontinued        2 g 200 mL/hr over 30 Minutes Intravenous To ShortStay Surgical 04/23/23 2111 04/27/23 0800   04/22/23 2300  piperacillin-tazobactam (ZOSYN) IVPB 3.375 g        3.375 g 12.5 mL/hr over 240 Minutes Intravenous Every 8 hours 04/22/23 2256 05/02/23 2359   04/22/23 1900  piperacillin-tazobactam (ZOSYN) IVPB 3.375 g        3.375 g 100 mL/hr over 30 Minutes Intravenous  Once 04/22/23 1852 04/22/23 1954       Assessment/Plan: s/p Procedure(s): EXPLORATORY LAPAROTOMY (N/A) COLON RESECTION SIGMOID WITH COLOSTOMY (N/A) APPLICATION OF WOUND VAC  POD #10  elap, sigmoid colectomy, colostomy 11/19- MW - CT 11/25 with ileus, no abscess - Continue NPO/NGT to LIWS and await return in bowel function - oob, pulm toilet - vac removed  Also following for new ostomy. Will need ostomy clinic referral at discharge - Continue PICC/TPN 11/22 --> - continue JP drain x2 - currently serosanguinous -Leukocytosis-monitor and consider repeat CT scan over weekend. Patient does not need any more antibiotics at this point from surgical standpoint - K pad and lidocaine patches for  back pain-adjust Robaxin since that seems to help her pain in general and try to decrease narcotic use due to severe ileus - continue IV tylenol   ID zosyn 11/14>> VTE - heparin heparin gtt for afib  FEN - TPN, NPO/NGT to LIWS     LOS: 15 days    Dortha Schwalbe MD 05/07/2023

## 2023-05-07 NOTE — Progress Notes (Addendum)
PHARMACY - TOTAL PARENTERAL NUTRITION CONSULT NOTE   Indication: Prolonged ileus  Patient Measurements: Height: 5' (152.4 cm) Weight: 85.3 kg (188 lb 0.8 oz) IBW/kg (Calculated) : 45.5 TPN AdjBW (KG): 54 Body mass index is 36.73 kg/m. 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 Patient has been NPO since surgery.   Glucose / Insulin: hx T2DM, A1c 6.2% this admit; no DM meds PTA; CBGs <180. 13 units mSSI/24 hours  Electrolytes: NA 133, K up to 4.2 (goal >4), bicarb 18, Phos 3.1, Mg 2.2 (goal >2), CoCa 9.8 Renal: SCr <1, BUN stable Hepatic: LFTs wnl, alk phos wnl, Tbili wnl; TG 184, alb 2.2 Intake / Output; MIVF: UOP doesn't appear to be charted accurately (IV lasix 20mg  11/26, 11/27). Colostomy output - 0 mL; NG output 2200 mL GI Imaging: 11/21 KUB moderately dilated loops of small bowel, c/w post-operative ileus 11/23 KUB: persistent small bowel obstruction or ileus, no significant change from prior 11/25 KUB: bowel obstruction or ileus, pleural effusion vs atelectasis 11/25 CTAP: post-op ileus or distal SBO, trace L pleural effusion 11/26 CXR: L > R lower lobe atelectasis GI Surgeries / Procedures:  11/19 ex lap with sigmoid colectomy and end left colostomy  Central access: PICC placed 04/30/23 TPN start date: 04/30/23  Nutritional Goals: Clinimix E 8/10 1L at 42 ml/hr with SMOF lipids on MTWThFSa Clinimix E 8/10 2L at 82 ml/hr on Sun - Average of 1258 kcals per day (~80% of goal) and 91g protein per day (100% goal) Previously on D10 fluids for add'l kCal >> stopped on 11/26 given concerns for volume overload (pleural effusions/atelectasis noted on imaging, weights increased, requiring Lasix IV) -  this has resolved  Electrolytes in Clinimix E 8/10 1L bag: Na 35 mEq, K 30 mEq, Mag 5 mEq, Ca 5 mEq, Phos , Acetate 83, Cl 76 mEq Electrolytes in Clinimix E 8/10 2L bag: Na , K , Mag , Ca , Phos , Ac , Cl .   RD Assessment: Estimated Needs Total Energy Estimated Needs: 1600-1800 kcal Total Protein Estimated Needs: 75-95 gm Total Fluid Estimated Needs: >1.6L  Current Nutrition:  NPO except for ice chips TPN 11/22 >>  Plan:  Clinimix E 8/10 1L @ 42 mL/hr + SMOF lipids on MTWThFSa Clinimix E 8/19 2L @ 82 mL/hr + SMOF lipids on Sunday Start D10 at 20 ml/hr tonight at 1800 to provide additional 163 calories -if pt tolerates then will consider increasing back to 63ml/hr. Continue standard MVI and trace elements to TPN Continue Moderate q4h SSI and adjust as needed  Monitor TPN labs Mon and Thur and PRN - BMET, Mg, Phos ordered by MD for a.m.  Christoper Fabian, PharmD, BCPS Please see amion for complete clinical pharmacist phone list 05/07/2023 8:20 AM

## 2023-05-07 NOTE — Progress Notes (Signed)
Occupational Therapy Treatment Patient Details Name: RICHARDEAN AYDIN MRN: [id number] DOB: 1946/01/24 Today's Date: 05/07/2023   History of present illness 77 y.o. 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 S/p exploratory laparotomy with sigmoid colectomy and end left colostomy by Dr. Dwain Sarna on 04/27/2023.  PMHx: colovaginal fistula associated with diverticulitis, obesity, OSA , BOOP, tracheobronchomalacia, chronic respiratory failure on 4 to 5 L oxygen at baseline, persistent A-fib S/P pacemaker implant 2017, chronic systolic heart failure, recent admission from 04/09/2023 to 04/13/2023.   OT comments  Pt continues to have abdominal pain and nausea, she was open to bed level theraband exercises. Educated pt on exercises for UB strengthening, she reports she she did not need a handout because she has good memory. Min cueing for hand/body position adjustment of exercises. Per RN pt more down in spirits, reached out to have rehab dogs come visit patient when able. OT to continue to progress pt as able. DC plans remain appropriate for SNF.       If plan is discharge home, recommend the following:  A little help with walking and/or transfers;A lot of help with bathing/dressing/bathroom;Assistance with cooking/housework;Assist for transportation;Help with stairs or ramp for entrance   Equipment Recommendations  None recommended by OT    Recommendations for Other Services      Precautions / Restrictions Precautions Precautions: Fall Precaution Comments: multiple drains, NGT, wound vac Required Braces or Orthoses: Other Brace Other Brace: Abdominal binder Restrictions Weight Bearing Restrictions: No       Mobility Bed Mobility                    Transfers                         Balance                                           ADL either performed or assessed with clinical judgement   ADL                                          General ADL Comments: Focused session on bed level exercises    Extremity/Trunk Assessment              Vision       Perception     Praxis      Cognition Arousal: Alert Behavior During Therapy: WFL for tasks assessed/performed Overall Cognitive Status: Within Functional Limits for tasks assessed                                          Exercises General Exercises - Upper Extremity Shoulder Flexion: AROM, Supine, Theraband, 10 reps, Both, Strengthening Theraband Level (Shoulder Flexion): Level 3 (Green) Shoulder Horizontal ABduction: AROM, Both, 10 reps, Theraband, Strengthening, Supine Theraband Level (Shoulder Horizontal Abduction): Level 3 (Green) Elbow Flexion: AROM, Strengthening, Both, 10 reps, Supine, Theraband Theraband Level (Elbow Flexion): Level 3 (Green) Elbow Extension: AROM, 10 reps, Strengthening, Both, Supine, Theraband Theraband Level (Elbow Extension): Level 3 (Green)    Shoulder Instructions       General Comments Discussed with RN that pt  has been down in spirits. Spent additional time with pt after ending therapy session to have genuine conversation (therapeutic listening). Contacted Kathlene November for therapy dogs to visit pt, she stated that she will be able to see pt next week    Pertinent Vitals/ Pain       Pain Assessment Pain Assessment: Faces Faces Pain Scale: Hurts little more Pain Location: abdomen Pain Descriptors / Indicators: Grimacing, Discomfort Pain Intervention(s): Monitored during session  Home Living                                          Prior Functioning/Environment              Frequency  Min 1X/week        Progress Toward Goals  OT Goals(current goals can now be found in the care plan section)  Progress towards OT goals: Progressing toward goals  Acute Rehab OT Goals Patient Stated Goal: to reduce pain OT Goal  Formulation: With patient Time For Goal Achievement: 05/12/23 Potential to Achieve Goals: Good ADL Goals Pt/caregiver will Perform Home Exercise Program: Increased strength;Both right and left upper extremity;With theraband;Independently  Plan      Co-evaluation                 AM-PAC OT "6 Clicks" Daily Activity     Outcome Measure   Help from another person eating meals?: None Help from another person taking care of personal grooming?: A Little Help from another person toileting, which includes using toliet, bedpan, or urinal?: A Lot Help from another person bathing (including washing, rinsing, drying)?: Total Help from another person to put on and taking off regular upper body clothing?: A Lot Help from another person to put on and taking off regular lower body clothing?: Total 6 Click Score: 13    End of Session    OT Visit Diagnosis: Unsteadiness on feet (R26.81);Other abnormalities of gait and mobility (R26.89);Muscle weakness (generalized) (M62.81);Pain Pain - part of body:  (abdomen)   Activity Tolerance Patient tolerated treatment well   Patient Left in bed;with call bell/phone within reach;with bed alarm set   Nurse Communication Mobility status        Time: 9604-5409 OT Time Calculation (min): 19 min  Charges: OT General Charges $OT Visit: 1 Visit OT Treatments $Therapeutic Exercise: 8-22 mins  05/07/2023  AB, OTR/L  Acute Rehabilitation Services  Office: [phone]   Tristan Schroeder 05/07/2023, 5:31 PM

## 2023-05-08 ENCOUNTER — Inpatient Hospital Stay (HOSPITAL_COMMUNITY): Payer: Medicare Other

## 2023-05-08 DIAGNOSIS — K5720 Diverticulitis of large intestine with perforation and abscess without bleeding: Secondary | ICD-10-CM | POA: Diagnosis not present

## 2023-05-08 LAB — MAGNESIUM: Magnesium: 2.1 mg/dL (ref 1.7–2.4)

## 2023-05-08 LAB — BASIC METABOLIC PANEL
Anion gap: 6 (ref 5–15)
BUN: 22 mg/dL (ref 8–23)
CO2: 17 mmol/L — ABNORMAL LOW (ref 22–32)
Calcium: 8.4 mg/dL — ABNORMAL LOW (ref 8.9–10.3)
Chloride: 111 mmol/L (ref 98–111)
Creatinine, Ser: 0.47 mg/dL (ref 0.44–1.00)
GFR, Estimated: >60 mL/min (ref >60)
Glucose, Bld: 170 mg/dL — ABNORMAL HIGH (ref 70–99)
Potassium: 4.1 mmol/L (ref 3.5–5.1)
Sodium: 134 mmol/L — ABNORMAL LOW (ref 135–145)

## 2023-05-08 LAB — PHOSPHORUS: Phosphorus: 2.7 mg/dL (ref 2.5–4.6)

## 2023-05-08 LAB — GLUCOSE, CAPILLARY
Glucose-Capillary: 151 mg/dL — ABNORMAL HIGH (ref 70–99)
Glucose-Capillary: 149 mg/dL — ABNORMAL HIGH (ref 70–99)
Glucose-Capillary: 138 mg/dL — ABNORMAL HIGH (ref 70–99)
Glucose-Capillary: 116 mg/dL — ABNORMAL HIGH (ref 70–99)
Glucose-Capillary: 152 mg/dL — ABNORMAL HIGH (ref 70–99)

## 2023-05-08 LAB — CBC
HCT: 27.1 % — ABNORMAL LOW (ref 36.0–46.0)
Hemoglobin: 7.9 g/dL — ABNORMAL LOW (ref 12.0–15.0)
MCH: 25 pg — ABNORMAL LOW (ref 26.0–34.0)
MCHC: 29.2 g/dL — ABNORMAL LOW (ref 30.0–36.0)
MCV: 85.8 fL (ref 80.0–100.0)
Platelets: 356 10*3/uL (ref 150–400)
RBC: 3.16 MIL/uL — ABNORMAL LOW (ref 3.87–5.11)
RDW: 17.3 % — ABNORMAL HIGH (ref 11.5–15.5)
WBC: 19.1 10*3/uL — ABNORMAL HIGH (ref 4.0–10.5)
nRBC: 0.5 % — ABNORMAL HIGH (ref 0.0–0.2)

## 2023-05-08 LAB — HEPARIN LEVEL (UNFRACTIONATED): Heparin Unfractionated: 0.37 [IU]/mL (ref 0.30–0.70)

## 2023-05-08 MED ORDER — FAT EMUL FISH OIL/PLANT BASED 20% (SMOFLIPID)IV EMUL
250.0000 mL | INTRAVENOUS | Status: AC
  Administered 2023-05-08: 250 mL via INTRAVENOUS
  Filled 2023-05-08: qty 250

## 2023-05-08 MED ORDER — SODIUM BICARBONATE 8.4 % IV SOLN
INTRAVENOUS | Status: DC
  Filled 2023-05-08 (×3): qty 1000

## 2023-05-08 MED ORDER — TRACE MINERALS CU-MN-SE-ZN 300-55-60-3000 MCG/ML IV SOLN
INTRAVENOUS | Status: AC
  Filled 2023-05-08 (×2): qty 1000

## 2023-05-08 MED ORDER — IOHEXOL 12 MG/ML PO SOLN: 500.0000 mL | ORAL | Status: AC

## 2023-05-08 NOTE — Progress Notes (Signed)
PROGRESS NOTE        PATIENT DETAILS Name: Kathleen [name] Age: 77 y.o. Sex: female Date of Birth: [birth year] Admit Date: 04/22/2023 Admitting Physician Gery Pray, MD [id number], Bernadene Bell, MD  Brief Summary: Patient is a 77 y.o.  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  Admitted by [facility]-started on empiric IV antibiotics-underwent preop assessment by cardiology-and then subsequently underwent  exploratory laparotomy with sigmoid colectomy and end ostomy on 11/19.  Postoperative course complicated by ileus-requiring NG tube decompression-and TNA for nutrition.  See below for further details.  Significant events: 11/14>> admit to [facility] 11/19>>exploratory laparotomy with sigmoid colectomy and end ostomy   Significant studies: 11/14>> CT abdomen/pelvis: Worsening signal right diverticulitis-localized perforation-5.6 cm perirectal abscess, 6.0 cm left lower quadrant abscess. 11/22>> x-ray abdomen: Consistent with postoperative ileus. 11/25>> CT abdomen: No abscess  Significant microbiology data: 11/14>> blood culture: No growth 11/14>> COVID/influenza/RSV PCR: Negative  Procedures: 11/19>>exploratory laparotomy with sigmoid colectomy  end ostomy on 11/19  Consults: Cardiology GI General surgery  Subjective:  Ports her nausea and pain are the same  Objective: Vitals: Blood pressure (!) 110/59, pulse 71, temperature 97.6 F (36.4 C), temperature source Oral, resp. rate 17, height 5' (1.524 m), weight 82.9 kg, SpO2 98%.   Exam:  Awake Alert, Oriented X 3, No new F.N deficits, Normal affect Symmetrical Chest wall movement, Good air movement bilaterally, CTAB RRR,No Gallops,Rubs or new Murmurs, No Parasternal Heave Abd Soft, No tenderness, no  stool in her ostomy No Cyanosis, Clubbing or edema, No new Rash or bruise     Pertinent Labs/Radiology:    Latest Ref Rng & Units 05/08/2023    3:34 AM 05/07/2023    4:13 AM 05/06/2023    3:46 AM  CBC  WBC 4.0 - 10.5 K/uL 19.1  17.5  16.1   Hemoglobin 12.0 - 15.0 g/dL 7.9  8.1  8.6   Hematocrit 36.0 - 46.0 % 27.1  27.4  29.9   Platelets 150 - 400 K/uL 356  336  351     Lab Results  Component Value Date   NA 134 (L) 05/08/2023   K 4.1 05/08/2023   CL 111 05/08/2023   CO2 17 (L) 05/08/2023     Assessment/Plan:  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 with localized perforation and pelvic abscess-s/p ex lap with colectomy/ostomy on 11/19-complicated by postoperative ileus requiring NG tube decompression Await recovery of bowel function-no stool in ostomy CT abdomen 11/25 without abscess-leukocytosis slowly downtrending-suspect this is reactive-Zosyn EOT 11/28.   TNA per pharmacy, awaiting return of bowel function Agement per general surgery, she remains off antibiotics, leukocytosis trending up, CT abdomen pelvis ordered by general surgery Encourage incentive spirometry/flutter valve Mobilize with PT/OT Out of bed to chair  Hypokalemia/hypophosphatemia Repleted  Normocytic anemia Due to critical illness-Hb fluctuating but relatively stable Minimal bloody fluid in ostomy-monitor for now-continue with IV heparin cautiously. Follow CBC.  Transfuse as needed.  Persistent atrial fibrillation Telemetry monitoring Tikosyn orally-okay to clamp NG tube for a few minutes Cautiously continue with IV heparin-see above. Attempt to keep K> 4  History of AV nodal ablation-s/p PPM ([employer]) Telemetry monitoring  Chronic HFrEF (EF 35-40% on 4/9 by TTE) Volume status stable Repeat IV  Lasix today-dose as needed based on exam to ensure negative balance.   Aldactone on hold-resume when oral intake is stable  Chronic hypoxic respiratory failure on home  O2 COPD BOOP Stable Pulmonary tolerating with incentive spirometry/flutter valve Mobilization with PT/OT Continue bronchodilators Incentive spirometry/flutter valve Out of bed to chair.  GERD PPI  DM-2 (A1c 6.2 on 11/15) CBGs stable on SSI  Recent Labs    05/08/23 0411 05/08/23 0813 05/08/23 1155  GLUCAP 151* 149* 138*   Nutrition Status: Nutrition Problem: Moderate Malnutrition Etiology: chronic illness Signs/Symptoms: mild fat depletion, mild muscle depletion, energy intake < 75% for > 7 days Interventions: Ensure Enlive (each supplement provides 350kcal and 20 grams of protein), Magic cup, MVI  Obesity: Estimated body mass index is 35.69 kg/m as calculated from the following:   Height as of this encounter: 5' (1.524 m).   Weight as of this encounter: 82.9 kg.   Code status:   Code Status: Limited: Do not attempt resuscitation (DNR) -DNR-LIMITED -Do Not Intubate/DNI    DVT Prophylaxis: IV heparin   Family Communication: None at bedside.   Disposition Plan: Status is: Inpatient Remains inpatient appropriate because: Severity of illness   Planned Discharge Destination:Skilled nursing facility   Diet: Diet Order             Diet NPO time specified Except for: Ice Chips  Diet effective now                     Antimicrobial agents: Anti-infectives (From admission, onward)    Start     Dose/Rate Route Frequency Ordered Stop   05/03/23 1400  piperacillin-tazobactam (ZOSYN) IVPB 3.375 g        3.375 g 12.5 mL/hr over 240 Minutes Intravenous Every 8 hours 05/03/23 0935 05/07/23 0124   04/27/23 1021  sodium chloride 0.9 % with cefoTEtan (CEFOTAN) ADS Med       Note to Pharmacy: Shanda Bumps M: cabinet override      04/27/23 1021 04/27/23 1253   04/26/23 0800  cefoTEtan (CEFOTAN) 2 g in sodium chloride 0.9 % 100 mL IVPB  Status:  Discontinued        2 g 200 mL/hr over 30 Minutes Intravenous To [facility] 04/23/23 2111 04/27/23 0800    04/22/23 2300  piperacillin-tazobactam (ZOSYN) IVPB 3.375 g        3.375 g 12.5 mL/hr over 240 Minutes Intravenous Every 8 hours 04/22/23 2256 05/02/23 2359   04/22/23 1900  piperacillin-tazobactam (ZOSYN) IVPB 3.375 g        3.375 g 100 mL/hr over 30 Minutes Intravenous  Once 04/22/23 1852 04/22/23 1954        MEDICATIONS: Scheduled Meds:  Chlorhexidine Gluconate Cloth  6 each Topical Daily   dofetilide  250 mcg Oral Q12H   fluticasone furoate-vilanterol  1 puff Inhalation Daily   gabapentin  200 mg Oral QHS   insulin aspart  0-15 Units Subcutaneous Q4H   lidocaine  1 patch Transdermal Q24H   methocarbamol (ROBAXIN) injection  500 mg Intravenous Q6H   mouth rinse  15 mL Mouth Rinse 4 times per day   pantoprazole (PROTONIX) IV  40 mg Intravenous Q12H   revefenacin  175 mcg Nebulization Daily   Continuous Infusions:  dextrose 20 mL/hr at 05/08/23 1256   TPN (CLINIMIX-E) Adult     And   fat emul(SMOFlipid)     heparin 1,900 Units/hr (05/08/23 1256)   promethazine (PHENERGAN) injection (IM or IVPB) Stopped (05/06/23 0334)  TPN (CLINIMIX-E) Adult 42 mL/hr at 05/08/23 1256   PRN Meds:.albuterol, guaiFENesin-dextromethorphan, HYDROmorphone (DILAUDID) injection, ondansetron (ZOFRAN) IV, promethazine (PHENERGAN) injection (IM or IVPB), sodium chloride flush   I have personally reviewed following labs and imaging studies  LABORATORY DATA: CBC: Recent Labs  Lab 05/04/23 0448 05/05/23 0102 05/06/23 0346 05/07/23 0413 05/08/23 0334  WBC 21.5* 21.9* 16.1* 17.5* 19.1*  HGB 7.9* 8.3* 8.6* 8.1* 7.9*  HCT 26.4* 27.1* 29.9* 27.4* 27.1*  MCV 84.3 83.9 89.0 85.6 85.8  PLT 355 336 351 336 356    Basic Metabolic Panel: Recent Labs  Lab 05/03/23 0604 05/04/23 0448 05/05/23 0102 05/06/23 0346 05/07/23 0413 05/08/23 0334  NA 133* 132* 133* 130* 133* 134*  K 3.7 3.5 3.8 3.9 4.2 4.1  CL 99 100 102 103 108 111  CO2 24 24 21* 20* 18* 17*  GLUCOSE 219* 191* 153* 173* 165* 170*   BUN 23 21 22 19 21 22   CREATININE 0.78 0.62 0.71 0.68 0.53 0.47  CALCIUM 8.3* 8.3* 8.4* 8.2* 8.4* 8.4*  MG 2.2 2.3 2.2 2.2  --  2.1  PHOS 2.3* 3.3 3.2 3.1  --  2.7    GFR: Estimated Creatinine Clearance: 57.1 mL/min (by C-G formula based on SCr of 0.47 mg/dL).  Liver Function Tests: Recent Labs  Lab 05/02/23 0359 05/03/23 0604 05/04/23 0448 05/05/23 0102 05/06/23 0346  AST 12* 14* 15 15 12*  ALT 11 12 11 12 11   ALKPHOS 47 44 47 51 48  BILITOT 0.4 0.4 0.5 0.5 0.4  PROT 5.2* 5.2* 5.1* 5.4* 5.4*  ALBUMIN 2.1* 2.1* 2.0* 2.2* 2.2*   No results for input(s): "LIPASE", "AMYLASE" in the last 168 hours. No results for input(s): "AMMONIA" in the last 168 hours.  Coagulation Profile: No results for input(s): "INR", "PROTIME" in the last 168 hours.  Cardiac Enzymes: No results for input(s): "CKTOTAL", "CKMB", "CKMBINDEX", "TROPONINI" in the last 168 hours.  BNP (last 3 results) No results for input(s): "PROBNP" in the last 8760 hours.  Lipid Profile: No results for input(s): "CHOL", "HDL", "LDLCALC", "TRIG", "CHOLHDL", "LDLDIRECT" in the last 72 hours.   Thyroid Function Tests: No results for input(s): "TSH", "T4TOTAL", "FREET4", "T3FREE", "THYROIDAB" in the last 72 hours.  Anemia Panel: No results for input(s): "VITAMINB12", "FOLATE", "FERRITIN", "TIBC", "IRON", "RETICCTPCT" in the last 72 hours.  Urine analysis:    Component Value Date/Time   COLORURINE AMBER (A) 04/23/2023 0623   APPEARANCEUR CLOUDY (A) 04/23/2023 0623   APPEARANCEUR Hazy 12/25/2012 1653   LABSPEC 1.024 04/23/2023 0623   LABSPEC 1.021 12/25/2012 1653   PHURINE 5.0 04/23/2023 0623   GLUCOSEU NEGATIVE 04/23/2023 0623   GLUCOSEU NEGATIVE 03/17/2023 1003   HGBUR MODERATE (A) 04/23/2023 0623   BILIRUBINUR NEGATIVE 04/23/2023 0623   BILIRUBINUR Negative 12/25/2012 1653   KETONESUR NEGATIVE 04/23/2023 0623   PROTEINUR 30 (A) 04/23/2023 0623   UROBILINOGEN 0.2 03/17/2023 1003   NITRITE NEGATIVE  04/23/2023 0623   LEUKOCYTESUR LARGE (A) 04/23/2023 0623   LEUKOCYTESUR Negative 12/25/2012 1653    Sepsis Labs: Lactic Acid, Venous    Component Value Date/Time   LATICACIDVEN 0.8 04/23/2023 0114    MICROBIOLOGY: No results found for this or any previous visit (from the past 240 hour(s)).   RADIOLOGY STUDIES/RESULTS: DG Abd 1 View  Result Date: 05/07/2023 CLINICAL DATA:  Abdominal pain EXAM: ABDOMEN - 1 VIEW COMPARISON:  05/03/2023 FINDINGS: Supine frontal view of the abdomen and pelvis excludes the hemidiaphragms and left flank by collimation. Enteric catheter tip  and side port project over the gastric body. Surgical drains are seen projecting over the lower pelvis. There is progressive gaseous distention of the bowel, with small-bowel loops measuring up to 6 cm within the central abdomen. No abdominal masses or abnormal calcifications. IMPRESSION: 1. Progressive gaseous distention of the small bowel concerning for obstruction or worsening postoperative ileus. Electronically Signed   By: Sharlet Salina M.D.   On: 05/07/2023 12:37     LOS: 16 days   Huey Bienenstock, MD  [facility]    To contact the attending provider between 7A-7P or the covering provider during after hours 7P-7A, please log into the web site [email or web address] and access using universal [place] password for that web site. If you do not have the password, please call the hospital operator.  05/08/2023, 1:30 PM

## 2023-05-08 NOTE — Plan of Care (Signed)
  Problem: Education: Goal: Ability to describe self-care measures that may prevent or decrease complications (Diabetes Survival Skills Education) will improve Outcome: Progressing Goal: Individualized Educational Video(s) Outcome: Progressing   Problem: Coping: Goal: Ability to adjust to condition or change in health will improve Outcome: Progressing   Problem: Fluid Volume: Goal: Ability to maintain a balanced intake and output will improve Outcome: Progressing   Problem: Health Behavior/Discharge Planning: Goal: Ability to identify and utilize available resources and services will improve Outcome: Progressing Goal: Ability to manage health-related needs will improve Outcome: Progressing   Problem: Metabolic: Goal: Ability to maintain appropriate glucose levels will improve Outcome: Progressing   Problem: Nutritional: Goal: Maintenance of adequate nutrition will improve Outcome: Progressing Goal: Progress toward achieving an optimal weight will improve Outcome: Progressing   Problem: Skin Integrity: Goal: Risk for impaired skin integrity will decrease Outcome: Progressing   Problem: Tissue Perfusion: Goal: Adequacy of tissue perfusion will improve Outcome: Progressing   Problem: Education: Goal: Knowledge of General Education information will improve Description: Including pain rating scale, medication(s)/side effects and non-pharmacologic comfort measures Outcome: Progressing   Problem: Health Behavior/Discharge Planning: Goal: Ability to manage health-related needs will improve Outcome: Progressing   Problem: Clinical Measurements: Goal: Ability to maintain clinical measurements within normal limits will improve Outcome: Progressing Goal: Will remain free from infection Outcome: Progressing Goal: Diagnostic test results will improve Outcome: Progressing Goal: Cardiovascular complication will be avoided Outcome: Progressing   Problem: Activity: Goal: Risk  for activity intolerance will decrease Outcome: Progressing   Problem: Nutrition: Goal: Adequate nutrition will be maintained Outcome: Progressing   Problem: Coping: Goal: Level of anxiety will decrease Outcome: Progressing   Problem: Elimination: Goal: Will not experience complications related to bowel motility Outcome: Progressing Goal: Will not experience complications related to urinary retention Outcome: Progressing   Problem: Pain Management: Goal: General experience of comfort will improve Outcome: Progressing   Problem: Safety: Goal: Ability to remain free from injury will improve Outcome: Progressing   Problem: Skin Integrity: Goal: Risk for impaired skin integrity will decrease Outcome: Progressing   Problem: Education: Goal: Understanding of discharge needs will improve Outcome: Progressing Goal: Verbalization of understanding of the causes of altered bowel function will improve Outcome: Progressing   Problem: Activity: Goal: Ability to tolerate increased activity will improve Outcome: Progressing   Problem: Bowel/Gastric: Goal: Gastrointestinal status for postoperative course will improve Outcome: Progressing   Problem: Health Behavior/Discharge Planning: Goal: Identification of community resources to assist with postoperative recovery needs will improve Outcome: Progressing   Problem: Nutritional: Goal: Will attain and maintain optimal nutritional status will improve Outcome: Progressing   Problem: Clinical Measurements: Goal: Postoperative complications will be avoided or minimized Outcome: Progressing   Problem: Respiratory: Goal: Respiratory status will improve Outcome: Progressing   Problem: Skin Integrity: Goal: Will show signs of wound healing Outcome: Progressing

## 2023-05-08 NOTE — Progress Notes (Signed)
PHARMACY - TOTAL PARENTERAL NUTRITION CONSULT NOTE   Indication: Prolonged ileus  Patient Measurements: Height: 5' (152.4 cm) Weight: 82.9 kg (182 lb 12.2 oz) IBW/kg (Calculated) : 45.5 TPN AdjBW (KG): 54 Body mass index is 35.69 kg/m. 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 Patient has been NPO since surgery.   Glucose / Insulin: hx T2DM, A1c 6.2% this admit; no DM meds PTA; CBGs <180. 13 units mSSI/24 hours  Electrolytes: Na 134, K 4.1 (goal >4), bicarb 17, Phos 2.7, Mg 2.1 (goal >2), CoCa 9.8 Renal: SCr <1, BUN stable Hepatic: LFTs wnl, alk phos wnl, Tbili wnl; TG 184, alb 2.2 Intake / Output; MIVF: ? Accuracy of  UOP charting (IV lasix 20mg  11/26, 11/27, 11/29). Colostomy output  25 mL; NG output 1650 mL; JP drain 10 GI Imaging: 11/21 KUB moderately dilated loops of small bowel, c/w post-operative ileus 11/23 KUB: persistent small bowel obstruction or ileus, no significant change from prior 11/25 KUB: bowel obstruction or ileus, pleural effusion vs atelectasis 11/25 CTAP: post-op ileus or distal SBO, trace L pleural effusion 11/26 CXR: L > R lower lobe atelectasis 11/29 Abd xray: distention of small bowel concerning for obstruction or worsening postop ileus GI Surgeries / Procedures:  11/19 ex lap with sigmoid colectomy and end left colostomy  Central access: PICC placed 04/30/23 TPN start date: 04/30/23  Nutritional Goals: Clinimix E 8/10 1L at 42 ml/hr with SMOF lipids on MTWThFSa Clinimix E 8/10 2L at 82 ml/hr on Sun with SMOF lipids - Average of 1258 kcals per day (~80% of goal) and 91g protein per day (100% goal) Previously on D10 fluids for add'l kCal >> stopped on 11/26 given concerns  for volume overload (pleural effusions/atelectasis noted on imaging, weights increased, requiring Lasix IV) - this has resolved  Electrolytes in Clinimix E 8/10 1L bag: Na 35 mEq, K 30 mEq, Mag 5 mEq, Ca 4.5 mEq, Phos , Acetate 83, Cl 76 mEq Electrolytes in Clinimix E 8/10 2L bag: Na , K , Mag , Ca , Phos , Ac , Cl 152 mEq.   RD Assessment: Estimated Needs Total Energy Estimated Needs: 1600-1800 kcal Total Protein Estimated Needs: 75-95 gm Total Fluid Estimated Needs: >1.6L  Current Nutrition:  NPO except for ice chips TPN 11/22 >>  Plan:  Clinimix E 8/10 1L @ 42 mL/hr + SMOF lipids at 20.8 ml/hr x 12 hrs on MTWThFSa Clinimix E 8/19 2L @ 82 mL/hr + SMOF lipids at 20.8 ml/hr x 12 hrs on Sunday D/w Dr. Randol Kern, d/c D10 for now and start D5 with sodium bicarbonate 171mEq/L at 20 ml/hr to help with bicarbonate downward trend. This will also contribute additional 82 kcal per day. Continue standard MVI and trace elements to TPN Continue Moderate q4h SSI and adjust as needed  Monitor TPN labs Mon and Thur and PRN - BMET, Mg, Phos in a.m.  Christoper Fabian, PharmD, BCPS Please see amion for complete clinical pharmacist phone list 05/08/2023 8:30 AM

## 2023-05-08 NOTE — Progress Notes (Signed)
PHARMACY - ANTICOAGULATION CONSULT NOTE  Pharmacy Consult for Heparin gtt Indication: atrial fibrillation  Patient Measurements: Height: 5' (152.4 cm) Weight: 82.9 kg (182 lb 12.2 oz) IBW/kg (Calculated) : 45.5 Heparin Dosing Weight: 64 kg  Vital Signs: Temp: 97.3 F (36.3 C) (11/30 0410) Temp Source: Oral (11/30 0410) BP: 138/61 (11/30 0410)  Labs: Recent Labs    05/06/23 0346 05/07/23 0413 05/08/23 0334  HGB 8.6* 8.1* 7.9*  HCT 29.9* 27.4* 27.1*  PLT 351 336 356  HEPARINUNFRC 0.41 0.49 0.37  CREATININE 0.68 0.53 0.47   Estimated Creatinine Clearance: 57.1 mL/min (by C-G formula based on SCr of 0.47 mg/dL).  Assessment: [name] with history of atrial fibrillation on Xarelto prior to admission. Last dose of Xarelto 04/21/23 at 1900. Admitted for recurrent diverticulitis, perforation, and perirectal abscess. 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 Cleared by surgery to start hep gtt for afib.  Heparin level remains therapeutic at 0.37 on heparin 1900 units/hr. No problems with the infusion or bleeding reported per RN. CBC low but stable. Patient remains on TPN.   Goal of Therapy:  Heparin level 0.3-0.7 units/ml Monitor platelets by anticoagulation protocol: Yes   Plan:  No bolus per surgery Continue heparin at 1900 units/hr Heparin level daily CBC daily  Monitor for signs/symptoms of bleeding  Thank you for allowing pharmacy to be a part of this patient's care.   Blane Ohara, PharmD, BCPS  PGY2 Pharmacy Resident   **Pharmacist phone directory can be found on [email or web address] listed under [facility]**

## 2023-05-08 NOTE — Progress Notes (Signed)
11 Days Post-Op   Subjective/Chief Complaint: Still with normal ostomy output.  Nausea and pain are about the same.   Objective: Vital signs in last 24 hours: Temp:  [97 F (36.1 C)-98 F (36.7 C)] 97.6 F (36.4 C) (11/30 0800) Pulse Rate:  [72-81] 75 (11/30 0800) Resp:  [16-22] 16 (11/30 0800) BP: (104-145)/(52-81) 104/52 (11/30 0800) SpO2:  [96 %-100 %] 100 % (11/30 0744) Weight:  [82.9 kg] 82.9 kg (11/30 0416) Last BM Date : 04/26/23  Intake/Output from previous day: 11/29 0701 - 11/30 0700 In: 1687.3 [P.O.:25; I.V.:1662.3] Out: 3195 [Urine:800; Emesis/NG output:2350; Drains:20; Stool:25] Intake/Output this shift: No intake/output data recorded.  Abdomen: Ostomy intact.  There is some bloody drainage.  Wound is open and clean.  Lab Results:  Recent Labs    05/07/23 0413 05/08/23 0334  WBC 17.5* 19.1*  HGB 8.1* 7.9*  HCT 27.4* 27.1*  PLT 336 356   BMET Recent Labs    05/07/23 0413 05/08/23 0334  NA 133* 134*  K 4.2 4.1  CL 108 111  CO2 18* 17*  GLUCOSE 165* 170*  BUN 21 22  CREATININE 0.53 0.47  CALCIUM 8.4* 8.4*   PT/INR No results for input(s): "LABPROT", "INR" in the last 72 hours. ABG No results for input(s): "PHART", "HCO3" in the last 72 hours.  Invalid input(s): "PCO2", "PO2"  Studies/Results: DG Abd 1 View  Result Date: 05/07/2023 CLINICAL DATA:  Abdominal pain EXAM: ABDOMEN - 1 VIEW COMPARISON:  05/03/2023 FINDINGS: Supine frontal view of the abdomen and pelvis excludes the hemidiaphragms and left flank by collimation. Enteric catheter tip and side port project over the gastric body. Surgical drains are seen projecting over the lower pelvis. There is progressive gaseous distention of the bowel, with small-bowel loops measuring up to 6 cm within the central abdomen. No abdominal masses or abnormal calcifications. IMPRESSION: 1. Progressive gaseous distention of the small bowel concerning for obstruction or worsening postoperative ileus.  Electronically Signed   By: Sharlet Salina M.D.   On: 05/07/2023 12:37    Anti-infectives: Anti-infectives (From admission, onward)    Start     Dose/Rate Route Frequency Ordered Stop   05/03/23 1400  piperacillin-tazobactam (ZOSYN) IVPB 3.375 g        3.375 g 12.5 mL/hr over 240 Minutes Intravenous Every 8 hours 05/03/23 0935 05/07/23 0124   04/27/23 1021  sodium chloride 0.9 % with cefoTEtan (CEFOTAN) ADS Med       Note to Pharmacy: Shanda Bumps M: cabinet override      04/27/23 1021 04/27/23 1253   04/26/23 0800  cefoTEtan (CEFOTAN) 2 g in sodium chloride 0.9 % 100 mL IVPB  Status:  Discontinued        2 g 200 mL/hr over 30 Minutes Intravenous To ShortStay Surgical 04/23/23 2111 04/27/23 0800   04/22/23 2300  piperacillin-tazobactam (ZOSYN) IVPB 3.375 g        3.375 g 12.5 mL/hr over 240 Minutes Intravenous Every 8 hours 04/22/23 2256 05/02/23 2359   04/22/23 1900  piperacillin-tazobactam (ZOSYN) IVPB 3.375 g        3.375 g 100 mL/hr over 30 Minutes Intravenous  Once 04/22/23 1852 04/22/23 1954       Assessment/Plan: s/p Procedure(s): EXPLORATORY LAPAROTOMY (N/A) COLON RESECTION SIGMOID WITH COLOSTOMY (N/A) APPLICATION OF WOUND VAC  POD #11  elap, sigmoid colectomy, colostomy 11/19- MW - CT 11/25 with ileus, no abscess - Continue NPO/NGT to LIWS and await return in bowel function - oob, pulm toilet -  vac removed  Also following for new ostomy. Will need ostomy clinic referral at discharge - Continue PICC/TPN 11/22 --> - continue JP drain x2 - currently serosanguinous -Leukocytosis-monitor and consider repeat CT scan over weekend. Patient does not need any more antibiotics at this point from surgical standpoint - K pad and lidocaine patches for back pain-adjust Robaxin since that seems to help her pain in general and try to decrease narcotic use due to severe ileus - continue IV tylenol   ID zosyn 11/14>> VTE - heparin heparin gtt for afib  FEN - TPN, NPO/NGT to  LIWS    Repeat CT scan today given increasing white count and continued ileus versus SBO    LOS: 16 days    Skylur Fuston A Tesslyn Baumert md  05/08/2023

## 2023-05-09 ENCOUNTER — Inpatient Hospital Stay (HOSPITAL_COMMUNITY): Payer: Medicare Other

## 2023-05-09 DIAGNOSIS — K5720 Diverticulitis of large intestine with perforation and abscess without bleeding: Secondary | ICD-10-CM | POA: Diagnosis not present

## 2023-05-09 LAB — BASIC METABOLIC PANEL
Anion gap: 7 (ref 5–15)
BUN: 22 mg/dL (ref 8–23)
CO2: 17 mmol/L — ABNORMAL LOW (ref 22–32)
Calcium: 8.6 mg/dL — ABNORMAL LOW (ref 8.9–10.3)
Chloride: 108 mmol/L (ref 98–111)
Creatinine, Ser: 0.61 mg/dL (ref 0.44–1.00)
GFR, Estimated: >60 mL/min (ref >60)
Glucose, Bld: 150 mg/dL — ABNORMAL HIGH (ref 70–99)
Potassium: 4.1 mmol/L (ref 3.5–5.1)
Sodium: 132 mmol/L — ABNORMAL LOW (ref 135–145)

## 2023-05-09 LAB — CBC
HCT: 25.8 % — ABNORMAL LOW (ref 36.0–46.0)
Hemoglobin: 7.7 g/dL — ABNORMAL LOW (ref 12.0–15.0)
MCH: 25.2 pg — ABNORMAL LOW (ref 26.0–34.0)
MCHC: 29.8 g/dL — ABNORMAL LOW (ref 30.0–36.0)
MCV: 84.3 fL (ref 80.0–100.0)
Platelets: 318 10*3/uL (ref 150–400)
RBC: 3.06 MIL/uL — ABNORMAL LOW (ref 3.87–5.11)
RDW: 17.7 % — ABNORMAL HIGH (ref 11.5–15.5)
WBC: 15.8 10*3/uL — ABNORMAL HIGH (ref 4.0–10.5)
nRBC: 0.4 % — ABNORMAL HIGH (ref 0.0–0.2)

## 2023-05-09 LAB — GLUCOSE, CAPILLARY
Glucose-Capillary: 130 mg/dL — ABNORMAL HIGH (ref 70–99)
Glucose-Capillary: 131 mg/dL — ABNORMAL HIGH (ref 70–99)
Glucose-Capillary: 137 mg/dL — ABNORMAL HIGH (ref 70–99)
Glucose-Capillary: 137 mg/dL — ABNORMAL HIGH (ref 70–99)
Glucose-Capillary: 141 mg/dL — ABNORMAL HIGH (ref 70–99)
Glucose-Capillary: 144 mg/dL — ABNORMAL HIGH (ref 70–99)
Glucose-Capillary: 150 mg/dL — ABNORMAL HIGH (ref 70–99)

## 2023-05-09 LAB — BRAIN NATRIURETIC PEPTIDE: B Natriuretic Peptide: 58.5 pg/mL (ref 0.0–100.0)

## 2023-05-09 LAB — HEPARIN LEVEL (UNFRACTIONATED): Heparin Unfractionated: 0.42 [IU]/mL (ref 0.30–0.70)

## 2023-05-09 LAB — PREPARE RBC (CROSSMATCH)

## 2023-05-09 MED ORDER — FAT EMUL FISH OIL/PLANT BASED 20% (SMOFLIPID)IV EMUL
250.0000 mL | INTRAVENOUS | Status: AC
  Administered 2023-05-09: 250 mL via INTRAVENOUS
  Filled 2023-05-09: qty 250

## 2023-05-09 MED ORDER — METOCLOPRAMIDE HCL 5 MG/ML IJ SOLN
10.0000 mg | Freq: Four times a day (QID) | INTRAMUSCULAR | Status: DC
  Filled 2023-05-09: qty 2

## 2023-05-09 MED ORDER — FUROSEMIDE 10 MG/ML IJ SOLN
20.0000 mg | Freq: Once | INTRAMUSCULAR | Status: AC
  Administered 2023-05-09: 20 mg via INTRAVENOUS
  Filled 2023-05-09: qty 2

## 2023-05-09 MED ORDER — IOHEXOL 350 MG/ML SOLN
75.0000 mL | Freq: Once | INTRAVENOUS | Status: AC | PRN
  Administered 2023-05-09: 75 mL via INTRAVENOUS

## 2023-05-09 MED ORDER — TRACE MINERALS CU-MN-SE-ZN 300-55-60-3000 MCG/ML IV SOLN
INTRAVENOUS | Status: AC
  Filled 2023-05-09 (×2): qty 2000

## 2023-05-09 MED ORDER — MAGIC MOUTHWASH
5.0000 mL | Freq: Four times a day (QID) | ORAL | Status: DC
  Administered 2023-05-09 – 2023-05-27 (×51): 5 mL via ORAL
  Filled 2023-05-09 (×74): qty 5

## 2023-05-09 MED ORDER — SODIUM CHLORIDE 0.9% IV SOLUTION: Freq: Once | INTRAVENOUS | Status: AC

## 2023-05-09 NOTE — Progress Notes (Signed)
PHARMACY - ANTICOAGULATION CONSULT NOTE  Pharmacy Consult for Heparin gtt Indication: atrial fibrillation  Patient Measurements: Height: 5' (152.4 cm) Weight: 82.2 kg (181 lb 3.5 oz) IBW/kg (Calculated) : 45.5 Heparin Dosing Weight: 64 kg  Vital Signs: Temp: 97 F (36.1 C) (12/01 0339) Temp Source: Axillary (12/01 0339) BP: 118/56 (12/01 0339) Pulse Rate: 70 (12/01 0339)  Labs: Recent Labs    05/07/23 0413 05/08/23 0334 05/09/23 0245  HGB 8.1* 7.9*  --   HCT 27.4* 27.1*  --   PLT 336 356  --   HEPARINUNFRC 0.49 0.37 0.42  CREATININE 0.53 0.47 0.61   Estimated Creatinine Clearance: 56.9 mL/min (by C-G formula based on SCr of 0.61 mg/dL).  Assessment: [name] with history of atrial fibrillation on Xarelto prior to admission. Last dose of Xarelto 04/21/23 at 1900. Admitted for recurrent diverticulitis, perforation, and perirectal abscess. 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 Cleared by surgery to start hep gtt for afib.  Heparin level remains therapeutic at 0.42 on heparin 1900 units/hr. RN aware to message with any concerns for bleeding or issues with the line. No CBC drawn yet this morning, will review when available. Patient remains on TPN unable to tolerate oral.   Goal of Therapy:  Heparin level 0.3-0.7 units/ml Monitor platelets by anticoagulation protocol: Yes   Plan:  No bolus per surgery Continue heparin at 1900 units/hr Heparin level daily CBC daily  Monitor for signs/symptoms of bleeding  Thank you for allowing pharmacy to be a part of this patient's care.   Blane Ohara, PharmD, BCPS  PGY2 Pharmacy Resident   **Pharmacist phone directory can be found on [email or web address] listed under [facility]**

## 2023-05-09 NOTE — Progress Notes (Signed)
[facility] night cross cover note:   I was contacted by RN regarding this patient who has a nasogastric tube attached to low intermittent wall suction.  The patient is concerned about potential dislodgment of the NGT.  Per RN assessment, the NGT appears to be in place, and is not changed positionally.   I will order a stat plain film in order to confirm NGT placement at this time.   Update: Plain film of the abdomen shows the tip of the NGT present in the proximal body of the stomach.    Newton Pigg, DO Hospitalist

## 2023-05-09 NOTE — Progress Notes (Signed)
12 Days Post-Op   Subjective/Chief Complaint: Pt about the same no ostomy output  some bleeding from ostomy  CT reviewed- no SBO appears like ileus no abscess    Objective: Vital signs in last 24 hours: Temp:  [97 F (36.1 C)-97.8 F (36.6 C)] 97.8 F (36.6 C) (12/01 0800) Pulse Rate:  [70-77] 70 (12/01 0800) Resp:  [14-19] 15 (12/01 0800) BP: (110-134)/(55-70) 130/55 (12/01 0800) SpO2:  [98 %-100 %] 100 % (12/01 0800) Weight:  [82.2 kg] 82.2 kg (12/01 0500) Last BM Date : 04/26/23 (Has new colostomy)  Intake/Output from previous day: 11/30 0701 - 12/01 0700 In: 666.4 [I.V.:666.4] Out: 1550 [Urine:300; Emesis/NG output:1250] Intake/Output this shift: No intake/output data recorded.  Abdomen wound open clean with a few new areas of breakdown but fascia intact Ostomy more maroon in color but still viable no air or output drain minimal and serous   Lab Results:  Recent Labs    05/08/23 0334 05/09/23 0800  WBC 19.1* 15.8*  HGB 7.9* 7.7*  HCT 27.1* 25.8*  PLT 356 318   BMET Recent Labs    05/08/23 0334 05/09/23 0245  NA 134* 132*  K 4.1 4.1  CL 111 108  CO2 17* 17*  GLUCOSE 170* 150*  BUN 22 22  CREATININE 0.47 0.61  CALCIUM 8.4* 8.6*   PT/INR No results for input(s): "LABPROT", "INR" in the last 72 hours. ABG No results for input(s): "PHART", "HCO3" in the last 72 hours.  Invalid input(s): "PCO2", "PO2"  Studies/Results: CT ABDOMEN PELVIS W CONTRAST  Result Date: 05/09/2023 CLINICAL DATA:  Abdominal pain EXAM: CT ABDOMEN AND PELVIS WITH CONTRAST TECHNIQUE: Multidetector CT imaging of the abdomen and pelvis was performed using the standard protocol following bolus administration of intravenous contrast. RADIATION DOSE REDUCTION: This exam was performed according to the departmental dose-optimization program which includes automated exposure control, adjustment of the mA and/or kV according to patient size and/or use of iterative reconstruction technique.  CONTRAST:  75mL OMNIPAQUE IOHEXOL 350 MG/ML SOLN COMPARISON:  05/03/2023 FINDINGS: Lower chest: Left lower lobe collapse. Dual-chamber pacer leads in the right heart which are unremarkable where covered. Hepatobiliary: Small cystic densities in the upper central and left lobe liver. No worrisome liver abnormality. Cholecystectomy without biliary dilatation.No evidence of biliary obstruction or stone. Pancreas: Unremarkable. Spleen: Unremarkable. Adrenals/Urinary Tract: Negative adrenals. No hydronephrosis or stone. 2.8 cm left renal cyst at the lower pole. Unremarkable bladder. Stomach/Bowel: Descending colostomy and Hartmann's pouch. Lax ileocolic mesentery which is in the left abdomen. Multiple loops of small bowel are distended with fluid levels. Inflammation is seen in the operative region with surgical drains. Superior and rightward from the stump of the Hartmann's pouch is a fluid collection measuring 3.6 x 1.6 cm, sterility indeterminate. Vascular/Lymphatic: No acute vascular abnormality. Atheromatous calcification of the aorta and iliacs. No mass or adenopathy. Reproductive:Hysterectomy Other: No ascites or pneumoperitoneum. Musculoskeletal: Lumbar spine degeneration without acute finding. IMPRESSION: 1. Mild small bowel ileus. 2. Expected degree of inflammation in the operative region. Small fluid collection near the colonic stump, 3.5 x 1.6 cm. Electronically Signed   By: Tiburcio Pea M.D.   On: 05/09/2023 04:31   DG Abd 1 View  Result Date: 05/07/2023 CLINICAL DATA:  Abdominal pain EXAM: ABDOMEN - 1 VIEW COMPARISON:  05/03/2023 FINDINGS: Supine frontal view of the abdomen and pelvis excludes the hemidiaphragms and left flank by collimation. Enteric catheter tip and side port project over the gastric body. Surgical drains are seen projecting over the lower  pelvis. There is progressive gaseous distention of the bowel, with small-bowel loops measuring up to 6 cm within the central abdomen. No  abdominal masses or abnormal calcifications. IMPRESSION: 1. Progressive gaseous distention of the small bowel concerning for obstruction or worsening postoperative ileus. Electronically Signed   By: Sharlet Salina M.D.   On: 05/07/2023 12:37    Anti-infectives: Anti-infectives (From admission, onward)    Start     Dose/Rate Route Frequency Ordered Stop   05/03/23 1400  piperacillin-tazobactam (ZOSYN) IVPB 3.375 g        3.375 g 12.5 mL/hr over 240 Minutes Intravenous Every 8 hours 05/03/23 0935 05/07/23 0124   04/27/23 1021  sodium chloride 0.9 % with cefoTEtan (CEFOTAN) ADS Med       Note to Pharmacy: Shanda Bumps M: cabinet override      04/27/23 1021 04/27/23 1253   04/26/23 0800  cefoTEtan (CEFOTAN) 2 g in sodium chloride 0.9 % 100 mL IVPB  Status:  Discontinued        2 g 200 mL/hr over 30 Minutes Intravenous To ShortStay Surgical 04/23/23 2111 04/27/23 0800   04/22/23 2300  piperacillin-tazobactam (ZOSYN) IVPB 3.375 g        3.375 g 12.5 mL/hr over 240 Minutes Intravenous Every 8 hours 04/22/23 2256 05/02/23 2359   04/22/23 1900  piperacillin-tazobactam (ZOSYN) IVPB 3.375 g        3.375 g 100 mL/hr over 30 Minutes Intravenous  Once 04/22/23 1852 04/22/23 1954       Assessment/Plan: s/p Procedure(s): EXPLORATORY LAPAROTOMY (N/A) COLON RESECTION SIGMOID WITH COLOSTOMY (N/A) APPLICATION OF WOUND VAC POD #12  elap, sigmoid colectomy, colostomy 11/19- MW - CT 11/25 with ileus, no abscess CT 05/08/23  ileus  - Continue NPO/NGT to LIWS and await return in bowel function - oob, pulm toilet - vac removed  Also following for new ostomy. Will need ostomy clinic referral at discharge more maroon than before today some slough and on hep gtt  hgb stable  - Continue PICC/TPN 11/22 --> - continue JP drain x2 - currently serosanguinous -Leukocytosis- better today  - K pad and lidocaine patches for back pain-adjust Robaxin since that seems to help her pain in general and try to decrease  narcotic use due to severe ileus - continue IV tylenol   ID zosyn 11/14>> VTE - heparin heparin gtt for afib  FEN - TPN, NPO/NGT to LIWS adding  reglan  to see if this can facilitate GI tract activity   LOS: 17 days    Dortha Schwalbe MD  05/09/2023

## 2023-05-09 NOTE — Progress Notes (Signed)
PHARMACY - TOTAL PARENTERAL NUTRITION CONSULT NOTE   Indication: Prolonged ileus  Patient Measurements: Height: 5' (152.4 cm) Weight: 82.2 kg (181 lb 3.5 oz) IBW/kg (Calculated) : 45.5 TPN AdjBW (KG): 54 Body mass index is 35.39 kg/m. 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 Patient has been NPO since surgery.   Glucose / Insulin: hx T2DM, A1c 6.2% this admit; no DM meds PTA; CBGs <180. 11 units mSSI/24 hours  Electrolytes: Na 132, K 4.1 (goal >4), bicarb 17(sodium bicarb gtt started at 76ml/hr yesterday afternoon), CoCa 10 Renal: SCr <1, BUN stable Hepatic: LFTs wnl, alk phos wnl, Tbili wnl; TG 184, alb 2.2 Intake / Output; MIVF: ? Accuracy of  UOP charting (IV lasix 20mg  11/26, 11/27, 11/29). Colostomy output 0 ml; NG output 1500 mL; JP drain 10 GI Imaging: 11/21 KUB moderately dilated loops of small bowel, c/w post-operative ileus 11/23 KUB: persistent small bowel obstruction or ileus, no significant change from prior 11/25 KUB: bowel obstruction or ileus, pleural effusion vs atelectasis 11/25 CTAP: post-op ileus or distal SBO, trace L pleural effusion 11/26 CXR: L > R lower lobe atelectasis 11/29 Abd xray: distention of small bowel concerning for obstruction or worsening postop ileus 12/1 CT abd: mild ileus GI Surgeries / Procedures:  11/19 ex lap with sigmoid colectomy and end left colostomy  Central access: PICC placed 04/30/23 TPN start date: 04/30/23  Nutritional Goals: Clinimix E 8/10 1L at 42 ml/hr with SMOF lipids on MTWThFSa Clinimix E 8/10 2L at 82 ml/hr on Sun with SMOF lipids - Average of 1258 kcals per day (~80% of goal) and 91g protein per day (100% goal) Previously on D10 fluids  for add'l kCal >> stopped on 11/26 given concerns for volume overload (pleural effusions/atelectasis noted on imaging, weights increased, requiring Lasix IV) - this has resolved so restarted D10 at 11/29, now changed to sodium bicarb D5 gtt which is providing additional 82 kcal per day  Electrolytes in Clinimix E 8/10 1L bag: Na 35 mEq, K 30 mEq, Mag 5 mEq, Ca 4.5 mEq, Phos , Acetate 83, Cl 76 mEq Electrolytes in Clinimix E 8/10 2L bag: Na , K , Mag , Ca , Phos , Ac , Cl 152 mEq.   RD Assessment: Estimated Needs Total Energy Estimated Needs: 1600-1800 kcal Total Protein Estimated Needs: 75-95 gm Total Fluid Estimated Needs: >1.6L  Current Nutrition:  NPO except for ice chips TPN 11/22 >>  Plan:  Clinimix E 8/10 1L @ 42 mL/hr + SMOF lipids at 20.8 ml/hr x 12 hrs on MTWThFSa Clinimix E 8/19 2L @ 82 mL/hr + SMOF lipids at 20.8 ml/hr x 12 hrs on Sunday Continue D5 with sodium bicarbonate 148mEq/L at 20 ml/hr to help with bicarbonate downward trend. This will also contribute additional 82 kcal per day. If bicarb continues downward trend on 12/2 labs, d/w [facility] MD and consider increase rate of sodium bicarb gtt  Continue standard MVI and trace elements to TPN Continue Moderate q4h SSI and adjust as needed  Monitor TPN labs Mon and [place] and PRN  Christoper Fabian, PharmD, BCPS Please see amion for complete clinical pharmacist phone list 05/09/2023 7:48 AM

## 2023-05-09 NOTE — Plan of Care (Signed)
  Problem: Education: Goal: Ability to describe self-care measures that may prevent or decrease complications (Diabetes Survival Skills Education) will improve Outcome: Progressing Goal: Individualized Educational Video(s) Outcome: Progressing   Problem: Coping: Goal: Ability to adjust to condition or change in health will improve Outcome: Progressing   Problem: Fluid Volume: Goal: Ability to maintain a balanced intake and output will improve Outcome: Progressing   Problem: Health Behavior/Discharge Planning: Goal: Ability to identify and utilize available resources and services will improve Outcome: Progressing Goal: Ability to manage health-related needs will improve Outcome: Progressing   Problem: Metabolic: Goal: Ability to maintain appropriate glucose levels will improve Outcome: Progressing   Problem: Nutritional: Goal: Maintenance of adequate nutrition will improve Outcome: Progressing Goal: Progress toward achieving an optimal weight will improve Outcome: Progressing   Problem: Skin Integrity: Goal: Risk for impaired skin integrity will decrease Outcome: Progressing   Problem: Tissue Perfusion: Goal: Adequacy of tissue perfusion will improve Outcome: Progressing   Problem: Education: Goal: Knowledge of General Education information will improve Description: Including pain rating scale, medication(s)/side effects and non-pharmacologic comfort measures Outcome: Progressing   Problem: Health Behavior/Discharge Planning: Goal: Ability to manage health-related needs will improve Outcome: Progressing   Problem: Clinical Measurements: Goal: Ability to maintain clinical measurements within normal limits will improve Outcome: Progressing Goal: Will remain free from infection Outcome: Progressing Goal: Diagnostic test results will improve Outcome: Progressing Goal: Cardiovascular complication will be avoided Outcome: Progressing   Problem: Activity: Goal: Risk  for activity intolerance will decrease Outcome: Progressing   Problem: Nutrition: Goal: Adequate nutrition will be maintained Outcome: Progressing   Problem: Coping: Goal: Level of anxiety will decrease Outcome: Progressing   Problem: Elimination: Goal: Will not experience complications related to bowel motility Outcome: Progressing Goal: Will not experience complications related to urinary retention Outcome: Progressing   Problem: Pain Management: Goal: General experience of comfort will improve Outcome: Progressing   Problem: Safety: Goal: Ability to remain free from injury will improve Outcome: Progressing   Problem: Skin Integrity: Goal: Risk for impaired skin integrity will decrease Outcome: Progressing   Problem: Education: Goal: Understanding of discharge needs will improve Outcome: Progressing Goal: Verbalization of understanding of the causes of altered bowel function will improve Outcome: Progressing   Problem: Activity: Goal: Ability to tolerate increased activity will improve Outcome: Progressing   Problem: Bowel/Gastric: Goal: Gastrointestinal status for postoperative course will improve Outcome: Progressing   Problem: Health Behavior/Discharge Planning: Goal: Identification of community resources to assist with postoperative recovery needs will improve Outcome: Progressing   Problem: Nutritional: Goal: Will attain and maintain optimal nutritional status will improve Outcome: Progressing   Problem: Clinical Measurements: Goal: Postoperative complications will be avoided or minimized Outcome: Progressing   Problem: Respiratory: Goal: Respiratory status will improve Outcome: Progressing   Problem: Skin Integrity: Goal: Will show signs of wound healing Outcome: Progressing

## 2023-05-09 NOTE — Progress Notes (Signed)
PROGRESS NOTE        PATIENT DETAILS Name: Kathleen [name] Age: 77 y.o. Sex: female Date of Birth: Sep 19, 1945 Admit Date: 04/22/2023 Admitting Physician Gery Pray, MD WGN:[id number], Bernadene Bell, MD  Brief Summary:  Patient is a 77 y.o.  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  Admitted by [facility]-started on empiric IV antibiotics-underwent preop assessment by cardiology-and then subsequently underwent  exploratory laparotomy with sigmoid colectomy and end ostomy on 11/19.  Postoperative course complicated by ileus-requiring NG tube decompression-and TNA for nutrition.  See below for further details.  Significant events: 11/14>> admit to [facility] 11/19>>exploratory laparotomy with sigmoid colectomy and end ostomy   Significant studies: 11/14>> CT abdomen/pelvis: Worsening signal right diverticulitis-localized perforation-5.6 cm perirectal abscess, 6.0 cm left lower quadrant abscess. 11/22>> x-ray abdomen: Consistent with postoperative ileus. 11/25>> CT abdomen: No abscess  Significant microbiology data: 11/14>> blood culture: No growth 11/14>> COVID/influenza/RSV PCR: Negative  Procedures: 11/19>>exploratory laparotomy with sigmoid colectomy  end ostomy on 11/19  Consults: Cardiology GI General surgery  Subjective:  She does report some sore throat, reports abdominal pain, nausea is the same.  Objective: Vitals: Blood pressure (!) 130/55, pulse 70, temperature 97.8 F (36.6 C), temperature source Oral, resp. rate 15, height 5' (1.524 m), weight 82.2 kg, SpO2 100%.   Exam:  Awake Alert, Oriented X 3, frail Symmetrical Chest wall movement, Good air movement bilaterally, CTAB RRR,No Gallops,Rubs or new Murmurs, No Parasternal Heave Abdomen soft,  colostomy present with small amount of blood fluid. 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 with localized perforation and pelvic abscess-s/p ex lap with colectomy/ostomy on 11/19-complicated by postoperative ileus requiring NG tube decompression - Await recovery of bowel function-no stool in ostomy -Continue with TPN for now awaiting return of bowel function -Management per general surgery, repeat CT abdomen pelvis with no evidence of SBO or abscess, looks like ileus -Continue with pain management, nausea management -Mobilize, PT, OT  Hypokalemia/hypophosphatemia Electrolyte management with TPN per pharmacy  Chronic blood loss anemia Due to critical illness-Hb fluctuating but relatively stable, but overall trending down, it is down to 7.7 today, and she is having very small amount of changed output in her colostomy, discussed with the patient, we will proceed with an unit PRBC transfusion given her count is expected to continue slowly trending down especially in the setting with her IV heparin. -Transfuse 1 unit today.  Persistent atrial fibrillation Telemetry monitoring Tikosyn orally-okay to clamp NG tube for a few  minutes Cautiously continue with IV heparin-see above. Attempt to keep K> 4  History of AV nodal ablation-s/p PPM ([employer]) Telemetry monitoring  Chronic HFrEF (EF 35-40% on 4/9 by TTE) Volume status stable Repeat IV Lasix today-dose  as needed based on exam to ensure negative balance.   Aldactone on hold-resume when oral intake is stable -Diuresis as needed, give IV Lasix after transfusion today.  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  GERD PPI  DM-2 (A1c 6.2 on 11/15) CBGs stable on SSI  Recent Labs    05/09/23 0022 05/09/23 0346 05/09/23 0806  GLUCAP 130* 137* 131*   Nutrition Status: Nutrition Problem: Moderate Malnutrition Etiology: chronic illness Signs/Symptoms: mild fat depletion, mild muscle depletion, energy intake < 75% for > 7 days Interventions: Ensure Enlive (each supplement provides 350kcal and 20 grams of protein), Magic cup, MVI  Obesity: Estimated body mass index is 35.39 kg/m as calculated from the following:   Height as of this encounter: 5' (1.524 m).   Weight as of this encounter: 82.2 kg.   Code status:   Code Status: Limited: Do not attempt resuscitation (DNR) -DNR-LIMITED -Do Not Intubate/DNI    DVT Prophylaxis: IV heparin   Family Communication: None at bedside.   Disposition Plan: Status is: Inpatient Remains inpatient appropriate because: Severity of illness   Planned Discharge Destination:Skilled nursing facility   Diet: Diet Order             Diet NPO time specified Except for: Ice Chips  Diet effective now                     Antimicrobial agents: Anti-infectives (From admission, onward)    Start     Dose/Rate Route Frequency Ordered Stop   05/03/23 1400  piperacillin-tazobactam (ZOSYN) IVPB 3.375 g        3.375 g 12.5 mL/hr over 240 Minutes Intravenous Every 8 hours 05/03/23 0935 05/07/23 0124   04/27/23 1021  sodium chloride 0.9 % with cefoTEtan (CEFOTAN) ADS Med       Note to Pharmacy: Shanda Bumps M: cabinet override      04/27/23 1021  04/27/23 1253   04/26/23 0800  cefoTEtan (CEFOTAN) 2 g in sodium chloride 0.9 % 100 mL IVPB  Status:  Discontinued        2 g 200 mL/hr over 30 Minutes Intravenous To ShortStay Surgical 04/23/23 2111 04/27/23 0800   04/22/23 2300  piperacillin-tazobactam (ZOSYN) IVPB 3.375 g        3.375 g 12.5 mL/hr over 240 Minutes Intravenous Every 8 hours 04/22/23 2256 05/02/23 2359   04/22/23 1900  piperacillin-tazobactam (ZOSYN) IVPB 3.375 g        3.375 g 100 mL/hr over 30 Minutes Intravenous  Once 04/22/23 1852 04/22/23 1954        MEDICATIONS: Scheduled Meds:  sodium chloride   Intravenous Once   Chlorhexidine Gluconate Cloth  6 each Topical Daily   dofetilide  250 mcg Oral Q12H   fluticasone furoate-vilanterol  1 puff Inhalation Daily   furosemide  20 mg Intravenous Once   gabapentin  200 mg Oral QHS   insulin aspart  0-15 Units Subcutaneous Q4H   lidocaine  1 patch Transdermal Q24H   methocarbamol (ROBAXIN) injection  500 mg Intravenous Q6H   metoCLOPramide (REGLAN) injection  10 mg Intravenous Q6H   mouth rinse  15 mL Mouth Rinse 4 times per day   pantoprazole (PROTONIX) IV  40 mg Intravenous Q12H   revefenacin  175 mcg Nebulization Daily   Continuous Infusions:  TPN (CLINIMIX-E) Adult     And   fat emul(SMOFlipid)     heparin 1,900 Units/hr (05/09/23 0918)   promethazine (PHENERGAN) injection (IM or IVPB) Stopped (05/06/23 0334)   sodium bicarbonate 150 mEq in dextrose 5 % 1,150 mL infusion 20 mL/hr at 05/08/23 1740   TPN (CLINIMIX-E) Adult 42 mL/hr at 05/08/23 1709   PRN Meds:.albuterol, guaiFENesin-dextromethorphan, HYDROmorphone (DILAUDID) injection, ondansetron (ZOFRAN) IV, promethazine (PHENERGAN) injection (IM or IVPB), sodium chloride flush   I have personally reviewed following labs and imaging studies  LABORATORY DATA: CBC: Recent Labs  Lab 05/05/23 0102 05/06/23 0346 05/07/23 0413 05/08/23 0334 05/09/23 0800  WBC 21.9* 16.1* 17.5* 19.1* 15.8*  HGB 8.3*  8.6* 8.1* 7.9* 7.7*  HCT 27.1* 29.9* 27.4* 27.1* 25.8*  MCV 83.9 89.0 85.6 85.8 84.3  PLT 336 351 336 356 318    Basic Metabolic Panel: Recent Labs  Lab 05/03/23 0604 05/04/23 0448 05/05/23 0102 05/06/23 0346 05/07/23 0413 05/08/23 0334 05/09/23 0245  NA 133* 132* 133* 130* 133* 134* 132*  K 3.7 3.5 3.8 3.9 4.2 4.1 4.1  CL 99 100 102 103 108 111 108  CO2 24 24 21* 20* 18* 17* 17*  GLUCOSE 219* 191* 153* 173* 165* 170* 150*  BUN 23 21 22 19 21 22 22   CREATININE 0.78 0.62 0.71 0.68 0.53 0.47 0.61  CALCIUM 8.3* 8.3* 8.4* 8.2* 8.4* 8.4* 8.6*  MG 2.2 2.3 2.2 2.2  --  2.1  --   PHOS 2.3* 3.3 3.2 3.1  --  2.7  --     GFR: Estimated Creatinine Clearance: 56.9 mL/min (by C-G formula based on SCr of 0.61 mg/dL).  Liver Function Tests: Recent Labs  Lab 05/03/23 0604 05/04/23 0448 05/05/23 0102 05/06/23 0346  AST 14* 15 15 12*  ALT 12 11 12 11   ALKPHOS 44 47 51 48  BILITOT 0.4 0.5 0.5 0.4  PROT 5.2* 5.1* 5.4* 5.4*  ALBUMIN 2.1* 2.0* 2.2* 2.2*   No results for input(s): "LIPASE", "AMYLASE" in the last 168 hours. No results for input(s): "AMMONIA" in the last 168 hours.  Coagulation Profile: No results for input(s): "INR", "PROTIME" in the last 168 hours.  Cardiac Enzymes: No results for input(s): "CKTOTAL", "CKMB", "CKMBINDEX", "TROPONINI" in the last 168 hours.  BNP (last 3 results) No results for input(s): "PROBNP" in the last 8760 hours.  Lipid Profile: No results for input(s): "CHOL", "HDL", "LDLCALC", "TRIG", "CHOLHDL", "LDLDIRECT" in the last 72 hours.   Thyroid Function Tests: No results for input(s): "TSH", "T4TOTAL", "FREET4", "T3FREE", "THYROIDAB" in the last 72 hours.  Anemia Panel: No results for input(s): "VITAMINB12", "FOLATE", "FERRITIN", "TIBC", "IRON", "RETICCTPCT" in the last 72 hours.  Urine analysis:    Component Value Date/Time   COLORURINE AMBER (A) 04/23/2023 0623   APPEARANCEUR CLOUDY (A) 04/23/2023 0623   APPEARANCEUR Hazy 12/25/2012  1653   LABSPEC 1.024 04/23/2023 0623   LABSPEC 1.021 12/25/2012 1653   PHURINE 5.0 04/23/2023 0623   GLUCOSEU NEGATIVE 04/23/2023 0623   GLUCOSEU NEGATIVE 03/17/2023 1003   HGBUR MODERATE (A) 04/23/2023 0623   BILIRUBINUR NEGATIVE 04/23/2023 0623   BILIRUBINUR Negative 12/25/2012 1653   KETONESUR NEGATIVE 04/23/2023 0623   PROTEINUR 30 (A) 04/23/2023 0623   UROBILINOGEN 0.2 03/17/2023 1003   NITRITE NEGATIVE 04/23/2023 0623   LEUKOCYTESUR LARGE (A)  04/23/2023 0623   LEUKOCYTESUR Negative 12/25/2012 1653    Sepsis Labs: Lactic Acid, Venous    Component Value Date/Time   LATICACIDVEN 0.8 04/23/2023 0114    MICROBIOLOGY: No results found for this or any previous visit (from the past 240 hour(s)).   RADIOLOGY STUDIES/RESULTS: CT ABDOMEN PELVIS W CONTRAST  Result Date: 05/09/2023 CLINICAL DATA:  Abdominal pain EXAM: CT ABDOMEN AND PELVIS WITH CONTRAST TECHNIQUE: Multidetector CT imaging of the abdomen and pelvis was performed using the standard protocol following bolus administration of intravenous contrast. RADIATION DOSE REDUCTION: This exam was performed according to the departmental dose-optimization program which includes automated exposure control, adjustment of the mA and/or kV according to patient size and/or use of iterative reconstruction technique. CONTRAST:  75mL OMNIPAQUE IOHEXOL 350 MG/ML SOLN COMPARISON:  05/03/2023 FINDINGS: Lower chest: Left lower lobe collapse. Dual-chamber pacer leads in the right heart which are unremarkable where covered. Hepatobiliary: Small cystic densities in the upper central and left lobe liver. No worrisome liver abnormality. Cholecystectomy without biliary dilatation.No evidence of biliary obstruction or stone. Pancreas: Unremarkable. Spleen: Unremarkable. Adrenals/Urinary Tract: Negative adrenals. No hydronephrosis or stone. 2.8 cm left renal cyst at the lower pole. Unremarkable bladder. Stomach/Bowel: Descending colostomy and Hartmann's pouch.  Lax ileocolic mesentery which is in the left abdomen. Multiple loops of small bowel are distended with fluid levels. Inflammation is seen in the operative region with surgical drains. Superior and rightward from the stump of the Hartmann's pouch is a fluid collection measuring 3.6 x 1.6 cm, sterility indeterminate. Vascular/Lymphatic: No acute vascular abnormality. Atheromatous calcification of the aorta and iliacs. No mass or adenopathy. Reproductive:Hysterectomy Other: No ascites or pneumoperitoneum. Musculoskeletal: Lumbar spine degeneration without acute finding. IMPRESSION: 1. Mild small bowel ileus. 2. Expected degree of inflammation in the operative region. Small fluid collection near the colonic stump, 3.5 x 1.6 cm. Electronically Signed   By: Tiburcio Pea M.D.   On: 05/09/2023 04:31   DG Abd 1 View  Result Date: 05/07/2023 CLINICAL DATA:  Abdominal pain EXAM: ABDOMEN - 1 VIEW COMPARISON:  05/03/2023 FINDINGS: Supine frontal view of the abdomen and pelvis excludes the hemidiaphragms and left flank by collimation. Enteric catheter tip and side port project over the gastric body. Surgical drains are seen projecting over the lower pelvis. There is progressive gaseous distention of the bowel, with small-bowel loops measuring up to 6 cm within the central abdomen. No abdominal masses or abnormal calcifications. IMPRESSION: 1. Progressive gaseous distention of the small bowel concerning for obstruction or worsening postoperative ileus. Electronically Signed   By: Sharlet Salina M.D.   On: 05/07/2023 12:37     LOS: 17 days   Huey Bienenstock, MD  [facility]    To contact the attending provider between 7A-7P or the covering provider during after hours 7P-7A, please log into the web site [email or web address] and access using universal [place] password for that web site. If you do not have the password, please call the hospital operator.  05/09/2023, 9:54 AM

## 2023-05-10 ENCOUNTER — Encounter: Payer: Self-pay | Admitting: Licensed Clinical Social Worker

## 2023-05-10 DIAGNOSIS — K5720 Diverticulitis of large intestine with perforation and abscess without bleeding: Secondary | ICD-10-CM | POA: Diagnosis not present

## 2023-05-10 DIAGNOSIS — Z7901 Long term (current) use of anticoagulants: Secondary | ICD-10-CM | POA: Diagnosis not present

## 2023-05-10 DIAGNOSIS — I5042 Chronic combined systolic (congestive) and diastolic (congestive) heart failure: Secondary | ICD-10-CM | POA: Diagnosis not present

## 2023-05-10 LAB — TYPE AND SCREEN
ABO/RH(D): A POS
Antibody Screen: NEGATIVE
Unit division: 0

## 2023-05-10 LAB — BASIC METABOLIC PANEL
Anion gap: 7 (ref 5–15)
BUN: 30 mg/dL — ABNORMAL HIGH (ref 8–23)
CO2: 17 mmol/L — ABNORMAL LOW (ref 22–32)
Calcium: 8.7 mg/dL — ABNORMAL LOW (ref 8.9–10.3)
Chloride: 111 mmol/L (ref 98–111)
Creatinine, Ser: 0.46 mg/dL (ref 0.44–1.00)
GFR, Estimated: >60 mL/min (ref >60)
Glucose, Bld: 153 mg/dL — ABNORMAL HIGH (ref 70–99)
Potassium: 4.1 mmol/L (ref 3.5–5.1)
Sodium: 135 mmol/L (ref 135–145)

## 2023-05-10 LAB — CBC
HCT: 28.8 % — ABNORMAL LOW (ref 36.0–46.0)
Hemoglobin: 9 g/dL — ABNORMAL LOW (ref 12.0–15.0)
MCH: 26.5 pg (ref 26.0–34.0)
MCHC: 31.3 g/dL (ref 30.0–36.0)
MCV: 84.7 fL (ref 80.0–100.0)
Platelets: 309 10*3/uL (ref 150–400)
RBC: 3.4 MIL/uL — ABNORMAL LOW (ref 3.87–5.11)
RDW: 17.2 % — ABNORMAL HIGH (ref 11.5–15.5)
WBC: 15.1 10*3/uL — ABNORMAL HIGH (ref 4.0–10.5)
nRBC: 0.3 % — ABNORMAL HIGH (ref 0.0–0.2)

## 2023-05-10 LAB — GLUCOSE, CAPILLARY
Glucose-Capillary: 167 mg/dL — ABNORMAL HIGH (ref 70–99)
Glucose-Capillary: 142 mg/dL — ABNORMAL HIGH (ref 70–99)
Glucose-Capillary: 152 mg/dL — ABNORMAL HIGH (ref 70–99)
Glucose-Capillary: 123 mg/dL — ABNORMAL HIGH (ref 70–99)
Glucose-Capillary: 133 mg/dL — ABNORMAL HIGH (ref 70–99)

## 2023-05-10 LAB — TRIGLYCERIDES: Triglycerides: 154 mg/dL — ABNORMAL HIGH (ref <150)

## 2023-05-10 LAB — HEPARIN LEVEL (UNFRACTIONATED): Heparin Unfractionated: 0.41 [IU]/mL (ref 0.30–0.70)

## 2023-05-10 LAB — BPAM RBC
Blood Product Expiration Date: 202412012359
ISSUE DATE / TIME: 202412011408
Unit Type and Rh: 600

## 2023-05-10 LAB — MAGNESIUM: Magnesium: 2.3 mg/dL (ref 1.7–2.4)

## 2023-05-10 LAB — BRAIN NATRIURETIC PEPTIDE: B Natriuretic Peptide: 68.4 pg/mL (ref 0.0–100.0)

## 2023-05-10 LAB — PHOSPHORUS: Phosphorus: 3.4 mg/dL (ref 2.5–4.6)

## 2023-05-10 MED ORDER — METOCLOPRAMIDE HCL 5 MG/ML IJ SOLN
10.0000 mg | Freq: Four times a day (QID) | INTRAMUSCULAR | Status: DC
  Administered 2023-05-10 – 2023-05-13 (×12): 10 mg via INTRAVENOUS
  Filled 2023-05-10 (×11): qty 2

## 2023-05-10 MED ORDER — PIPERACILLIN-TAZOBACTAM 3.375 G IVPB
3.3750 g | Freq: Three times a day (TID) | INTRAVENOUS | Status: DC
  Administered 2023-05-10 – 2023-05-17 (×21): 3.375 g via INTRAVENOUS
  Filled 2023-05-10 (×23): qty 50

## 2023-05-10 MED ORDER — FUROSEMIDE 10 MG/ML IJ SOLN
20.0000 mg | Freq: Once | INTRAMUSCULAR | Status: AC
  Administered 2023-05-10: 20 mg via INTRAVENOUS
  Filled 2023-05-10: qty 2

## 2023-05-10 MED ORDER — ACETAMINOPHEN 10 MG/ML IV SOLN
1000.0000 mg | Freq: Four times a day (QID) | INTRAVENOUS | Status: AC
  Filled 2023-05-10 (×4): qty 100

## 2023-05-10 MED ORDER — FAT EMUL FISH OIL/PLANT BASED 20% (SMOFLIPID)IV EMUL
250.0000 mL | INTRAVENOUS | Status: AC
  Administered 2023-05-10: 250 mL via INTRAVENOUS
  Filled 2023-05-10: qty 250

## 2023-05-10 MED ORDER — TRACE MINERALS CU-MN-SE-ZN 300-55-60-3000 MCG/ML IV SOLN
INTRAVENOUS | Status: AC
  Filled 2023-05-10 (×2): qty 1000

## 2023-05-10 NOTE — Plan of Care (Signed)
  Problem: Education: Goal: Ability to describe self-care measures that may prevent or decrease complications (Diabetes Survival Skills Education) will improve Outcome: Progressing   Problem: Coping: Goal: Ability to adjust to condition or change in health will improve Outcome: Progressing   Problem: Skin Integrity: Goal: Risk for impaired skin integrity will decrease Outcome: Progressing   Problem: Pain Management: Goal: General experience of comfort will improve Outcome: Progressing

## 2023-05-10 NOTE — Progress Notes (Signed)
PHARMACY - ANTICOAGULATION CONSULT NOTE  Pharmacy Consult for Heparin gtt Indication: atrial fibrillation  Patient Measurements: Height: 5' (152.4 cm) Weight: 82.6 kg (182 lb 1.6 oz) IBW/kg (Calculated) : 45.5 Heparin Dosing Weight: 64 kg  Vital Signs: Temp: 97.7 F (36.5 C) (12/02 0400) Temp Source: Oral (12/02 0400) BP: 127/62 (12/02 0400) Pulse Rate: 70 (12/02 0400)  Labs: Recent Labs    05/08/23 0334 05/09/23 0245 05/09/23 0800 05/10/23 0226  HGB 7.9*  --  7.7* 9.0*  HCT 27.1*  --  25.8* 28.8*  PLT 356  --  318 309  HEPARINUNFRC 0.37 0.42  --  0.41  CREATININE 0.47 0.61  --  0.46   Estimated Creatinine Clearance: 57 mL/min (by C-G formula based on SCr of 0.46 mg/dL).  Assessment: Kathleen [name] with history of atrial fibrillation on Xarelto prior to admission. Last dose of Xarelto 04/21/23 at 1900. Admitted for recurrent diverticulitis, perforation, and perirectal abscess. 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 Cleared by surgery to start hep gtt for afib.  Heparin remains therapeutic. S/p PRBC>>hgb 9. Cont current rate and monitor for bleeding.   Goal of Therapy:  Heparin level 0.3-0.7 units/ml Monitor platelets by anticoagulation protocol: Yes   Plan:  No bolus per surgery Continue heparin at 1900 units/hr Heparin level daily/CBC  Monitor for signs/symptoms of bleeding  Ulyses Southward, PharmD, BCIDP, AAHIVP, CPP Infectious Disease Pharmacist 05/10/2023 7:28 AM

## 2023-05-10 NOTE — Progress Notes (Addendum)
Patient ID: NOAM MAPLES, female   DOB: [birth year], 77 y.o.   MRN: [id number] [facility] Surgery Progress Note  13 Days Post-Op  Subjective: CC-  Son at bedside. Patient was complaining of a lot of back pain this morning, passed flatus per stoma and pain improved. Small amount of bloody output from ostomy. Some retching around NG tube, no emesis. NG in good position and working.  Reglan discontinued yesterday due to concern for Qtc prolongation. Primary team plans to discuss with EP.  Objective: Vital signs in last 24 hours: Temp:  [97.6 F (36.4 C)-98.4 F (36.9 C)] 97.6 F (36.4 C) (12/02 0806) Pulse Rate:  [70-80] 70 (12/02 0400) Resp:  [15-19] 17 (12/02 0400) BP: (97-135)/(50-90) 122/50 (12/02 0806) SpO2:  [99 %-100 %] 100 % (12/02 0400) Weight:  [82.6 kg] 82.6 kg (12/02 0432) Last BM Date : 05/09/23  Intake/Output from previous day: 12/01 0701 - 12/02 0700 In: 500 [Blood:500] Out: 1922 [Urine:830; Emesis/NG output:950; Drains:92; Stool:50] Intake/Output this shift: No intake/output data recorded.  PE: Gen:  Alert, NAD Card:  RRR Pulm:  rate and effort normal on supplemental O2 via [place] Abd: soft, appropriately tender, open midline pictured below with few areas of breakdown but fascia appears intact, stoma dark and sloughing with dark bloody output in bag, JP #1 tan, JP #2 serosanguinous    Lab Results:  Recent Labs    05/09/23 0800 05/10/23 0226  WBC 15.8* 15.1*  HGB 7.7* 9.0*  HCT 25.8* 28.8*  PLT 318 309   BMET Recent Labs    05/09/23 0245 05/10/23 0226  NA 132* 135  K 4.1 4.1  CL 108 111  CO2 17* 17*  GLUCOSE 150* 153*  BUN 22 30*  CREATININE 0.61 0.46  CALCIUM 8.6* 8.7*   PT/INR No results for input(s): "LABPROT", "INR" in the last 72 hours. CMP     Component Value Date/Time   NA 135 05/10/2023 0226   NA 130 (L) 12/28/2012 0748   K 4.1 05/10/2023 0226   K 4.6 12/28/2012 0748   CL 111 05/10/2023 0226   CL 95 (L) 12/28/2012 0748    CO2 17 (L) 05/10/2023 0226   CO2 23 12/28/2012 0748   GLUCOSE 153 (H) 05/10/2023 0226   GLUCOSE 391 (H) 12/28/2012 0748   BUN 30 (H) 05/10/2023 0226   BUN 32 (H) 12/28/2012 0748   CREATININE 0.46 05/10/2023 0226   CREATININE 0.86 01/27/2023 1137   CALCIUM 8.7 (L) 05/10/2023 0226   CALCIUM 14.3 (HH) 06/26/2019 1618   PROT 5.4 (L) 05/06/2023 0346   ALBUMIN 2.2 (L) 05/06/2023 0346   AST 12 (L) 05/06/2023 0346   ALT 11 05/06/2023 0346   ALKPHOS 48 05/06/2023 0346   BILITOT 0.4 05/06/2023 0346   GFRNONAA >60 05/10/2023 0226   GFRNONAA 43 (L) 12/28/2012 0748   GFRAA 58 (L) 06/28/2019 0427   GFRAA 50 (L) 12/28/2012 0748   Lipase     Component Value Date/Time   LIPASE 30 04/22/2023 1840       Studies/Results: DG Abd Portable 1V  Result Date: 05/09/2023 CLINICAL DATA:  [id number] with NG tube placement. EXAM: PORTABLE ABDOMEN - 1 VIEW COMPARISON:  CT earlier today at 2:01 a.m. FINDINGS: Similar to the CT, injury to curves to the left in the stomach with the tip in the proximal body of stomach, well intragastric. Small bowel dilatation up to 4 cm in the upper abdomen continues to be seen. Left pleural effusion and overlying left lower lobe  consolidation continue to obscure the hemidiaphragm. Left chest pacing system and wire insertions, right PICC stable with tip at the superior cavoatrial junction. IMPRESSION: 1. NG tube tip in the proximal body of stomach, well intragastric. 2. Small bowel dilatation up to 4 cm in the upper abdomen continues to be seen. 3. Left pleural effusion and overlying left lower lobe consolidation. Electronically Signed   By: Almira Bar M.D.   On: 05/09/2023 21:15   CT ABDOMEN PELVIS W CONTRAST  Result Date: 05/09/2023 CLINICAL DATA:  Abdominal pain EXAM: CT ABDOMEN AND PELVIS WITH CONTRAST TECHNIQUE: Multidetector CT imaging of the abdomen and pelvis was performed using the standard protocol following bolus administration of intravenous contrast. RADIATION DOSE  REDUCTION: This exam was performed according to the departmental dose-optimization program which includes automated exposure control, adjustment of the mA and/or kV according to patient size and/or use of iterative reconstruction technique. CONTRAST:  75mL OMNIPAQUE IOHEXOL 350 MG/ML SOLN COMPARISON:  05/03/2023 FINDINGS: Lower chest: Left lower lobe collapse. Dual-chamber pacer leads in the right heart which are unremarkable where covered. Hepatobiliary: Small cystic densities in the upper central and left lobe liver. No worrisome liver abnormality. Cholecystectomy without biliary dilatation.No evidence of biliary obstruction or stone. Pancreas: Unremarkable. Spleen: Unremarkable. Adrenals/Urinary Tract: Negative adrenals. No hydronephrosis or stone. 2.8 cm left renal cyst at the lower pole. Unremarkable bladder. Stomach/Bowel: Descending colostomy and Hartmann's pouch. Lax ileocolic mesentery which is in the left abdomen. Multiple loops of small bowel are distended with fluid levels. Inflammation is seen in the operative region with surgical drains. Superior and rightward from the stump of the Hartmann's pouch is a fluid collection measuring 3.6 x 1.6 cm, sterility indeterminate. Vascular/Lymphatic: No acute vascular abnormality. Atheromatous calcification of the aorta and iliacs. No mass or adenopathy. Reproductive:Hysterectomy Other: No ascites or pneumoperitoneum. Musculoskeletal: Lumbar spine degeneration without acute finding. IMPRESSION: 1. Mild small bowel ileus. 2. Expected degree of inflammation in the operative region. Small fluid collection near the colonic stump, 3.5 x 1.6 cm. Electronically Signed   By: Tiburcio Pea M.D.   On: 05/09/2023 04:31    Anti-infectives: Anti-infectives (From admission, onward)    Start     Dose/Rate Route Frequency Ordered Stop   05/03/23 1400  piperacillin-tazobactam (ZOSYN) IVPB 3.375 g        3.375 g 12.5 mL/hr over 240 Minutes Intravenous Every 8 hours  05/03/23 0935 05/07/23 0124   04/27/23 1021  sodium chloride 0.9 % with cefoTEtan (CEFOTAN) ADS Med       Note to Pharmacy: Shanda Bumps M: cabinet override      04/27/23 1021 04/27/23 1253   04/26/23 0800  cefoTEtan (CEFOTAN) 2 g in sodium chloride 0.9 % 100 mL IVPB  Status:  Discontinued        2 g 200 mL/hr over 30 Minutes Intravenous To ShortStay Surgical 04/23/23 2111 04/27/23 0800   04/22/23 2300  piperacillin-tazobactam (ZOSYN) IVPB 3.375 g        3.375 g 12.5 mL/hr over 240 Minutes Intravenous Every 8 hours 04/22/23 2256 05/02/23 2359   04/22/23 1900  piperacillin-tazobactam (ZOSYN) IVPB 3.375 g        3.375 g 100 mL/hr over 30 Minutes Intravenous  Once 04/22/23 1852 04/22/23 1954        Assessment/Plan POD #13  elap, sigmoid colectomy, colostomy 11/19- MW - CT 12/1 ileus, small fluid collection near colonic stump 3.5x1.6cm. JP drain #1 tan/purulent today, continue drains. Restart zosyn - Continue NPO/NGT to LIWS and await  return in bowel function. Passing some flatus today. Primary team to discuss use of Reglan in this patient with EP due to risk of Qtc prolongation - vac unable to hold seal and was removed, continue BID wet to dry dressing changes - WOC following for new ostomy. Stoma maroon/sloughing, monitor. Will need ostomy clinic referral at discharge  - Continue PICC/TPN 11/22 --> - Leukocytosis improving 15.1, afebrile. Monitor - continue IV tylenol - oob, pulm toilet   ID zosyn 11/14>>11/29, 12/2>> VTE - heparin heparin gtt for afib  FEN - TPN, NPO/NGT to LIWS     LOS: 18 days    Franne Forts, [facility] Surgery 05/10/2023, 8:32 AM Please see Amion for pager number during day hours 7:00am-4:30pm

## 2023-05-10 NOTE — Consult Note (Signed)
WOC Nurse ostomy consult note Stoma type/location: Stoma is red and viable, 1 1/2 inches, slightly above skin level.  Small amt bloody drainage for a detachment 5`clock with 1 cm, no stool.  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    Applied pouch as follows:  Applied barrier ring and 2 piece convex pouching system. 2 sets of each supply left at the bedside:  Use Supplies: Barrier ring [id number] Pouch #649 Barrier [phone].  Placed on Secure Star.  Thank-you,  Denyse Amass BSN, RN, [employer], WOC  (Pager: [phone])

## 2023-05-10 NOTE — Progress Notes (Signed)
PROGRESS NOTE        PATIENT DETAILS Name: Kathleen [name] Age: 77 y.o. Sex: female Date of Birth: May 08, 1946 Admit Date: 04/22/2023 Admitting Physician Gery Pray, MD BJY:NWGNF, Bernadene Bell, MD  Brief Summary:  Patient is a 77 y.o.  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  Admitted by [facility]-started on empiric IV antibiotics-underwent preop assessment by cardiology-and then subsequently underwent  exploratory laparotomy with sigmoid colectomy and end ostomy on 11/19.  Postoperative course complicated by ileus-requiring NG tube decompression-and TNA for nutrition.  See below for further details.  Significant events: 11/14>> admit to [facility] 11/19>>exploratory laparotomy with sigmoid colectomy and end ostomy   Significant studies: 11/14>> CT abdomen/pelvis: Worsening signal right diverticulitis-localized perforation-5.6 cm perirectal abscess, 6.0 cm left lower quadrant abscess. 11/22>> x-ray abdomen: Consistent with postoperative ileus. 11/25>> CT abdomen: No abscess  Significant microbiology data: 11/14>> blood culture: No growth 11/14>> COVID/influenza/RSV PCR: Negative  Procedures: 11/19>>exploratory laparotomy with sigmoid colectomy  end ostomy on 11/19  Consults: Cardiology GI General surgery  Subjective:  Reports some bloating today, increased abdomen discomfort  Objective: Vitals: Blood pressure (!) 122/50, pulse 70, temperature 97.6 F (36.4 C), temperature source Oral, resp. rate 17, height 5' (1.524 m), weight 82.6 kg, SpO2 100%.   Exam:  Awake Alert, Oriented X 3, No new F.N deficits, Normal affect Symmetrical Chest wall movement, Good air movement bilaterally, CTAB RRR,No Gallops,Rubs or new Murmurs, No Parasternal Heave +ve  B.Sounds, Abd Soft, appropriately tender, wound covered, colostomy present with dark bloody output. 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 with localized perforation and pelvic abscess-s/p ex lap with colectomy/ostomy on 11/19-complicated by postoperative ileus requiring NG tube decompression - Await recovery of bowel function-no stool in ostomy -Continue with TPN for now awaiting return of bowel function -Management per general surgery, repeat CT abdomen pelvis with no evidence of SBO or abscess, looks like ileus -Continue with pain management, nausea management -Mobilize, PT, OT, minimize narcotics -Continue with NGT suction -Discussed with cardiology on-call, and her QTc is reassuring can resume Reglan, with close monitoring of QTc, will recheck another EKG in a.m.  Hypokalemia/hypophosphatemia Electrolyte management with TPN per pharmacy -Will need to target potassium more than 4, magnesium more than 2 , she is on Tikosyn.  Chronic blood loss anemia Due to critical illness-Hb fluctuating but relatively stable, but overall trending down, it is down to 7.7 today, and she is having very small amount of changed output in her colostomy, discussed  with the patient, we will proceed with an unit PRBC transfusion given her count is expected to continue slowly trending down especially in the setting with her IV heparin. -Received 1 unit PRBC 12/1  Persistent atrial  fibrillation Telemetry monitoring Tikosyn orally-okay to clamp NG tube for a few minutes Cautiously continue with IV heparin-see above. 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   Aldactone on hold-resume when oral intake is stable -Diuresis as needed, will give an extra dose of IV Lasix today.  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  GERD PPI  DM-2 (A1c 6.2 on 11/15) CBGs stable on SSI  Recent Labs    05/09/23 2333 05/10/23 0412 05/10/23 0747  GLUCAP 144* 167* 142*   Nutrition Status: Nutrition Problem: Moderate Malnutrition Etiology: chronic illness Signs/Symptoms: mild fat depletion, mild muscle depletion, energy intake < 75% for > 7 days Interventions: Ensure Enlive (each supplement provides 350kcal and 20 grams of protein), Magic cup, MVI  Obesity: Estimated body mass index is 35.56 kg/m as calculated from the following:   Height as of this encounter: 5' (1.524 m).   Weight as of this encounter: 82.6 kg.   Code status:   Code Status: Limited: Do not attempt resuscitation (DNR) -DNR-LIMITED -Do Not Intubate/DNI    DVT Prophylaxis: IV heparin   Family Communication: None at bedside.   Disposition Plan: Status is: Inpatient Remains inpatient appropriate because: Severity of illness   Planned Discharge Destination:Skilled nursing facility   Diet: Diet Order             Diet NPO time specified Except for: Ice Chips  Diet  effective now                     Antimicrobial agents: Anti-infectives (From admission, onward)    Start     Dose/Rate Route Frequency Ordered Stop   05/10/23 1400  piperacillin-tazobactam (ZOSYN) IVPB 3.375 g        3.375 g 12.5 mL/hr over 240 Minutes Intravenous Every 8 hours 05/10/23 0914     05/03/23 1400  piperacillin-tazobactam (ZOSYN) IVPB 3.375 g        3.375 g 12.5 mL/hr over 240 Minutes Intravenous Every 8 hours 05/03/23 0935 05/07/23 0124   04/27/23 1021  sodium chloride 0.9 % with cefoTEtan (CEFOTAN) ADS Med       Note to Pharmacy: Shanda Bumps M: cabinet override      04/27/23 1021 04/27/23 1253   04/26/23 0800  cefoTEtan (CEFOTAN) 2 g in sodium chloride 0.9 % 100 mL IVPB  Status:  Discontinued        2 g 200 mL/hr over 30 Minutes Intravenous To [facility] 04/23/23 2111 04/27/23 0800   04/22/23 2300  piperacillin-tazobactam (ZOSYN) IVPB 3.375 g        3.375 g 12.5 mL/hr over 240 Minutes Intravenous Every 8 hours 04/22/23 2256 05/02/23 2359   04/22/23 1900  piperacillin-tazobactam (ZOSYN) IVPB 3.375 g        3.375 g 100 mL/hr over 30 Minutes Intravenous  Once 04/22/23 1852 04/22/23 1954        MEDICATIONS: Scheduled Meds:  Chlorhexidine Gluconate Cloth  6 each Topical Daily   dofetilide  250 mcg Oral Q12H   fluticasone furoate-vilanterol  1 puff Inhalation Daily   gabapentin  200 mg Oral QHS   insulin aspart  0-15 Units Subcutaneous Q4H   lidocaine  1 patch Transdermal Q24H   magic mouthwash  5 mL Oral QID   methocarbamol (ROBAXIN) injection  500 mg Intravenous Q6H   metoCLOPramide (REGLAN) injection  10 mg Intravenous Q6H   mouth rinse  15 mL Mouth Rinse 4 times per day   pantoprazole (PROTONIX) IV  40 mg Intravenous Q12H   revefenacin  175 mcg Nebulization Daily   Continuous Infusions:  acetaminophen     TPN (CLINIMIX-E) Adult     And   fat emul(SMOFlipid)     heparin 1,900 Units/hr (05/09/23 2240)   piperacillin-tazobactam (ZOSYN)   IV     promethazine (PHENERGAN) injection (IM or IVPB) Stopped (05/06/23 0334)   sodium bicarbonate 150 mEq in dextrose 5 % 1,150 mL infusion 50 mL/hr at 05/10/23 0842   TPN (CLINIMIX-E) Adult 82 mL/hr at 05/09/23 1731   PRN Meds:.albuterol, guaiFENesin-dextromethorphan, HYDROmorphone (DILAUDID) injection, ondansetron (ZOFRAN) IV, promethazine (PHENERGAN) injection (IM or IVPB), sodium chloride flush   I have personally reviewed following labs and imaging studies  LABORATORY DATA: CBC: Recent Labs  Lab 05/06/23 0346 05/07/23 0413 05/08/23 0334 05/09/23 0800 05/10/23 0226  WBC 16.1* 17.5* 19.1* 15.8* 15.1*  HGB 8.6* 8.1* 7.9* 7.7* 9.0*  HCT 29.9* 27.4* 27.1* 25.8* 28.8*  MCV 89.0 85.6 85.8 84.3 84.7  PLT 351 336 356 318 309    Basic Metabolic Panel: Recent Labs  Lab 05/04/23 0448 05/05/23 0102 05/06/23 0346 05/07/23 0413 05/08/23 0334 05/09/23 0245 05/10/23 0226  NA 132* 133* 130* 133* 134* 132* 135  K 3.5 3.8 3.9 4.2 4.1 4.1 4.1  CL 100 102 103 108 111 108 111  CO2 24 21* 20* 18* 17* 17* 17*  GLUCOSE 191* 153* 173* 165* 170* 150* 153*  BUN 21 22 19 21 22 22  30*  CREATININE 0.62 0.71 0.68 0.53 0.47 0.61 0.46  CALCIUM 8.3* 8.4* 8.2* 8.4* 8.4* 8.6* 8.7*  MG 2.3 2.2 2.2  --  2.1  --  2.3  PHOS 3.3 3.2 3.1  --  2.7  --  3.4    GFR: Estimated Creatinine Clearance: 57 mL/min (by C-G formula based on SCr of 0.46 mg/dL).  Liver Function Tests: Recent Labs  Lab 05/04/23 0448 05/05/23 0102 05/06/23 0346  AST 15 15 12*  ALT 11 12 11   ALKPHOS 47 51 48  BILITOT 0.5 0.5 0.4  PROT 5.1* 5.4* 5.4*  ALBUMIN 2.0* 2.2* 2.2*   No results for input(s): "LIPASE", "AMYLASE" in the last 168 hours. No results for input(s): "AMMONIA" in the last 168 hours.  Coagulation Profile: No results for input(s): "INR", "PROTIME" in the last 168 hours.  Cardiac Enzymes: No results for input(s): "CKTOTAL", "CKMB", "CKMBINDEX", "TROPONINI" in the last 168 hours.  BNP (last 3  results) No results for input(s): "PROBNP" in the last 8760 hours.  Lipid Profile: Recent Labs    05/10/23 0226  TRIG 154*     Thyroid Function Tests: No results for input(s): "TSH", "T4TOTAL", "FREET4", "T3FREE", "THYROIDAB" in the last 72 hours.  Anemia Panel: No results for input(s): "VITAMINB12", "FOLATE", "FERRITIN", "TIBC", "IRON", "RETICCTPCT" in the last 72 hours.  Urine analysis:  Component Value Date/Time   COLORURINE AMBER (A) 04/23/2023 0623   APPEARANCEUR CLOUDY (A) 04/23/2023 0623   APPEARANCEUR Hazy 12/25/2012 1653   LABSPEC 1.024 04/23/2023 0623   LABSPEC 1.021 12/25/2012 1653   PHURINE 5.0 04/23/2023 0623   GLUCOSEU NEGATIVE 04/23/2023 0623   GLUCOSEU NEGATIVE 03/17/2023 1003   HGBUR MODERATE (A) 04/23/2023 0623   BILIRUBINUR NEGATIVE 04/23/2023 0623   BILIRUBINUR Negative 12/25/2012 1653   KETONESUR NEGATIVE 04/23/2023 0623   PROTEINUR 30 (A) 04/23/2023 0623   UROBILINOGEN 0.2 03/17/2023 1003   NITRITE NEGATIVE 04/23/2023 0623   LEUKOCYTESUR LARGE (A) 04/23/2023 0623   LEUKOCYTESUR Negative 12/25/2012 1653    Sepsis Labs: Lactic Acid, Venous    Component Value Date/Time   LATICACIDVEN 0.8 04/23/2023 0114    MICROBIOLOGY: No results found for this or any previous visit (from the past 240 hour(s)).   RADIOLOGY STUDIES/RESULTS: DG Abd Portable 1V  Result Date: 05/09/2023 CLINICAL DATA:  [id number] with NG tube placement. EXAM: PORTABLE ABDOMEN - 1 VIEW COMPARISON:  CT earlier today at 2:01 a.m. FINDINGS: Similar to the CT, injury to curves to the left in the stomach with the tip in the proximal body of stomach, well intragastric. Small bowel dilatation up to 4 cm in the upper abdomen continues to be seen. Left pleural effusion and overlying left lower lobe consolidation continue to obscure the hemidiaphragm. Left chest pacing system and wire insertions, right PICC stable with tip at the superior cavoatrial junction. IMPRESSION: 1. NG tube tip in the  proximal body of stomach, well intragastric. 2. Small bowel dilatation up to 4 cm in the upper abdomen continues to be seen. 3. Left pleural effusion and overlying left lower lobe consolidation. Electronically Signed   By: Almira Bar M.D.   On: 05/09/2023 21:15   CT ABDOMEN PELVIS W CONTRAST  Result Date: 05/09/2023 CLINICAL DATA:  Abdominal pain EXAM: CT ABDOMEN AND PELVIS WITH CONTRAST TECHNIQUE: Multidetector CT imaging of the abdomen and pelvis was performed using the standard protocol following bolus administration of intravenous contrast. RADIATION DOSE REDUCTION: This exam was performed according to the departmental dose-optimization program which includes automated exposure control, adjustment of the mA and/or kV according to patient size and/or use of iterative reconstruction technique. CONTRAST:  75mL OMNIPAQUE IOHEXOL 350 MG/ML SOLN COMPARISON:  05/03/2023 FINDINGS: Lower chest: Left lower lobe collapse. Dual-chamber pacer leads in the right heart which are unremarkable where covered. Hepatobiliary: Small cystic densities in the upper central and left lobe liver. No worrisome liver abnormality. Cholecystectomy without biliary dilatation.No evidence of biliary obstruction or stone. Pancreas: Unremarkable. Spleen: Unremarkable. Adrenals/Urinary Tract: Negative adrenals. No hydronephrosis or stone. 2.8 cm left renal cyst at the lower pole. Unremarkable bladder. Stomach/Bowel: Descending colostomy and Hartmann's pouch. Lax ileocolic mesentery which is in the left abdomen. Multiple loops of small bowel are distended with fluid levels. Inflammation is seen in the operative region with surgical drains. Superior and rightward from the stump of the Hartmann's pouch is a fluid collection measuring 3.6 x 1.6 cm, sterility indeterminate. Vascular/Lymphatic: No acute vascular abnormality. Atheromatous calcification of the aorta and iliacs. No mass or adenopathy. Reproductive:Hysterectomy Other: No ascites or  pneumoperitoneum. Musculoskeletal: Lumbar spine degeneration without acute finding. IMPRESSION: 1. Mild small bowel ileus. 2. Expected degree of inflammation in the operative region. Small fluid collection near the colonic stump, 3.5 x 1.6 cm. Electronically Signed   By: Tiburcio Pea M.D.   On: 05/09/2023 04:31     LOS: 18 days   Tanvi Gatling  Desarie Feild, MD  [facility]    To contact the attending provider between 7A-7P or the covering provider during after hours 7P-7A, please log into the web site [email or web address] and access using universal [place] password for that web site. If you do not have the password, please call the hospital operator.  05/10/2023, 9:28 AM

## 2023-05-10 NOTE — Plan of Care (Signed)
  Problem: Education: Goal: Ability to describe self-care measures that may prevent or decrease complications (Diabetes Survival Skills Education) will improve Outcome: Progressing Goal: Individualized Educational Video(s) Outcome: Progressing   Problem: Coping: Goal: Ability to adjust to condition or change in health will improve Outcome: Progressing   Problem: Fluid Volume: Goal: Ability to maintain a balanced intake and output will improve Outcome: Progressing   Problem: Health Behavior/Discharge Planning: Goal: Ability to identify and utilize available resources and services will improve Outcome: Progressing Goal: Ability to manage health-related needs will improve Outcome: Progressing   Problem: Metabolic: Goal: Ability to maintain appropriate glucose levels will improve Outcome: Progressing   Problem: Nutritional: Goal: Maintenance of adequate nutrition will improve Outcome: Progressing Goal: Progress toward achieving an optimal weight will improve Outcome: Progressing   Problem: Skin Integrity: Goal: Risk for impaired skin integrity will decrease Outcome: Progressing   Problem: Tissue Perfusion: Goal: Adequacy of tissue perfusion will improve Outcome: Progressing   Problem: Education: Goal: Knowledge of General Education information will improve Description: Including pain rating scale, medication(s)/side effects and non-pharmacologic comfort measures Outcome: Progressing   Problem: Health Behavior/Discharge Planning: Goal: Ability to manage health-related needs will improve Outcome: Progressing   Problem: Clinical Measurements: Goal: Ability to maintain clinical measurements within normal limits will improve Outcome: Progressing Goal: Will remain free from infection Outcome: Progressing Goal: Diagnostic test results will improve Outcome: Progressing Goal: Cardiovascular complication will be avoided Outcome: Progressing   Problem: Activity: Goal: Risk  for activity intolerance will decrease Outcome: Progressing   Problem: Nutrition: Goal: Adequate nutrition will be maintained Outcome: Progressing   Problem: Coping: Goal: Level of anxiety will decrease Outcome: Progressing   Problem: Elimination: Goal: Will not experience complications related to bowel motility Outcome: Progressing Goal: Will not experience complications related to urinary retention Outcome: Progressing   Problem: Pain Management: Goal: General experience of comfort will improve Outcome: Progressing   Problem: Safety: Goal: Ability to remain free from injury will improve Outcome: Progressing   Problem: Skin Integrity: Goal: Risk for impaired skin integrity will decrease Outcome: Progressing   Problem: Education: Goal: Understanding of discharge needs will improve Outcome: Progressing Goal: Verbalization of understanding of the causes of altered bowel function will improve Outcome: Progressing   Problem: Activity: Goal: Ability to tolerate increased activity will improve Outcome: Progressing   Problem: Bowel/Gastric: Goal: Gastrointestinal status for postoperative course will improve Outcome: Progressing   Problem: Health Behavior/Discharge Planning: Goal: Identification of community resources to assist with postoperative recovery needs will improve Outcome: Progressing   Problem: Nutritional: Goal: Will attain and maintain optimal nutritional status will improve Outcome: Progressing   Problem: Clinical Measurements: Goal: Postoperative complications will be avoided or minimized Outcome: Progressing   Problem: Respiratory: Goal: Respiratory status will improve Outcome: Progressing   Problem: Skin Integrity: Goal: Will show signs of wound healing Outcome: Progressing

## 2023-05-10 NOTE — Progress Notes (Signed)
PT Cancellation Note  Patient Details Name: Kathleen [name] MRN: [id number] DOB: [birth year]   Cancelled Treatment:    Reason Eval/Treat Not Completed: (P) Fatigue/lethargy limiting ability to participate (Attempted to see pt x2 today with pt reporting increased fatigue and recently returning to bed. Pt requests therapy return tomorrow. Will continue to follow per PT POC.)   Johny Shock 05/10/2023, 3:44 PM

## 2023-05-10 NOTE — Progress Notes (Addendum)
PHARMACY - TOTAL PARENTERAL NUTRITION CONSULT NOTE  Indication: Prolonged ileus  Patient Measurements: Height: 5' (152.4 cm) Weight: 82.6 kg (182 lb 1.6 oz) IBW/kg (Calculated) : 45.5 TPN AdjBW (KG): 54 Body mass index is 35.56 kg/m. Weight trends: 79.3kg (11/21) >>82.9 kg (11/30)  Assessment:  61 YOF with recurrent sigmoid diverticulitis and associated colovaginal fistula, HFrEF, COPD, Afib with PPM, and gout who was admitted with persistent symptoms with multiple abscesses s/p ex lap with sigmoid colectomy and end left colostomy on 11/19. Patient has been NPO since surgery. Pharmacy consulted to manage TPN.  Glucose / Insulin: hx T2DM, A1c 6.2%; no meds PTA - CBGs < 180 Used 24 units mSSI/24 hours  Electrolytes: all WNL except low CO2 (on bicarb gtt) Renal: SCr <1, BUN 30s Hepatic: LFTs / tbili WNL, TG slightly elevated at 154, albumin 2.2 Intake / Output; MIVF: UOP 0.4 ml/kg/hr, last Lasix 12/1. NG , JP drain 92mL, colostomy output 50mL GI Imaging: 11/21 KUB moderately dilated loops of small bowel, c/w post-operative ileus 11/23 KUB: persistent SBO or ileus, no significant change from prior 11/25 KUB: bowel obstruction or ileus, pleural effusion vs atelectasis 11/25 CTAP: post-op ileus or distal SBO, trace L pleural effusion 11/26 CXR: L > R lower lobe atelectasis 11/29 Abd XR: distention of SB concerning for obstruction or worsening postop ileus 12/1 CT abd: mild ileus GI Surgeries / Procedures:  11/19 ex lap with sigmoid colectomy and end left colostomy  Central access: PICC placed 04/30/23 TPN start date: 04/30/23  Nutritional Goals: Clinimix E 8/10 1L at 42 ml/hr with SMOF lipids on MTWThFSa Clinimix E 8/10 2L at 82 ml/hr on Sun with SMOFlipids - Average of 1258 kCals (~80% of goal) and 91g AA per day (100% goal) Previously on D10 fluids for add'l kCal >> stopped on 11/26 given concerns for pleural effusions/atelectasis noted on imaging, weights increased requiring  Lasix IV - this has resolved so restarted D10 at 11/29, now changed to sodium bicarb D5 gtt   Electrolytes in Clinimix E 8/10 1L bag: Na 35 mEq, K 30 mEq, Mag 5 mEq, Ca 4.5 mEq, Phos , Acetate 83, Cl 76 mEq  RD Estimated Needs Total Energy Estimated Needs: 1600-1800 kcal Total Protein Estimated Needs: 75-95 gm Total Fluid Estimated Needs: >1.6L  Current Nutrition:  TPN   Plan:  Clinimix E 8/10 1L at 42 mL/hr and SMOFlipids at 20.8 ml/hr x 12 hrs on MTWThFSa Clinimix E 8/10 2L at 82 mL/hr and SMOFlipids at 20.8 ml/hr x 12 hrs on Sunday TPN providing an average of 1258 kCals (~80% of goal) and 91g AA per day (100% goal) Bicarb gtt in D5W at 50 ml/hr per MD, providing 204 kCal per day Continue standard MVI and trace elements to TPN Continue moderate SSI Q4H Monitor TPN labs Mon and Thur and PRN  Maytte Jacot D. Laney Potash, PharmD, BCPS, BCCCP 05/10/2023, 7:26 AM

## 2023-05-10 NOTE — Progress Notes (Signed)
Nutrition Follow-up  DOCUMENTATION CODES:   Non-severe (moderate) malnutrition in context of chronic illness  INTERVENTION:  Clinimix E 8/10 1L @ 42 mL/hr + SMOF lipids + D10 @ 42 mL/hr on MTWThFSa Clinimix E 8/19 2L @ 82 mL/hr + SMOF lipids on Sunday   Electrolytes in Clinimix E 8/10 1L bag: Na 35 mEq, K 30 mEq, Mag 5 mEq, Ca 5 mEq, Phos , Acetate 83, Cl 76 mEq Electrolytes in Clinimix E 8/10 2L bag: Na , K , Mag , Ca , Phos , Ac , Cl .    Continue standard MVI and trace elements to TPN Continue Moderate q4h SSI and adjust as needed  Continue to monitor for diet up advancement. Once diet upgraded implement Ensure Plus High Protein po BID, each supplement provides 350 kcal and 20 grams of protein.   NUTRITION DIAGNOSIS:   Moderate Malnutrition related to chronic illness as evidenced by mild fat depletion, mild muscle depletion, energy intake < 75% for > 7 days.  Ongoing with intervention in place  GOAL:   Patient will meet greater than or equal to 90% of their needs    MONITOR:   PO intake, Supplement acceptance, Diet advancement, Labs, Weight trends  REASON FOR ASSESSMENT:   Consult New TPN/TNA  ASSESSMENT:   77 y.o. F, admitted with PMH of hypothyroidism, COPD,OSA (on BiPAP), BOOP (noncandidate for stenting), diverticulitis, CHF, obesity, HTN, gout, and colovaginal fistula. Presented 11/14 for worsening diverticulitis with new bowel perforation.  Continues with NG vented/dual Lumen Fr.16 low intermittent suctioning, daily total.  Closed system drain RLQ bulb(jp) 19 Fr. Closed system drain RUQ Bulb (JP) 19 Fr. 19 Colostomy Significant events: 11/14>> admit to [facility] 11/19>>exploratory laparotomy with sigmoid colectomy and end ostomy    Significant studies: 11/14>> CT abdomen/pelvis: Worsening signal right diverticulitis-localized perforation-5.6 cm perirectal abscess, 6.0 cm left lower quadrant abscess. 11/22>> x-ray  abdomen: Consistent with postoperative ileus. 11/25>> CT abdomen: No abscess GI Imaging: 11/21 KUB moderately dilated loops of small bowel, c/w post-operative ileus 11/23 KUB: persistent SBO or ileus, no significant change from prior 11/25 KUB: bowel obstruction or ileus, pleural effusion vs atelectasis 11/25 CTAP: post-op ileus or distal SBO, trace L pleural effusion 11/26 CXR: L > R lower lobe atelectasis 11/29 Abd XR: distention of SB concerning for obstruction or worsening postop ileus 12/1 CT abd: mild ileus.  Pt is nauseated, drowsy and in pain, she was not wanting to engage in conversation.   Hospital weight history:  Date/Time Weight Weight in lbs  05/10/23 0432 82.6 kg 182.1 lbs  05/09/23 0500 82.2 kg 181.22 lbs  05/08/23 0416 82.9 kg 182.76 lbs  05/06/23 0500 85.3 kg 188.05 lbs  05/05/23 0500 87 kg 191.8 lbs  05/04/23 0500 90.7 kg 199.96 lbs  05/02/23 0500 87.2 kg 192.24 lbs  04/30/23 0500 79.3 kg 174.83 lbs  04/29/23 0500 79.3 kg 174.83 lbs  04/27/23 1028 79.4 kg 175 lbs  04/26/23 0500 84 kg 185.19 lbs  04/25/23 0423 83.5 kg 184.08 lbs  04/24/23 0429 82.7 kg 182.32 lbs  04/22/23 1829 79.4 kg 175 lbs     Average Meal Intake: NPO  Nutritionally Relevant Medications: Scheduled Meds:  furosemide  20 mg Intravenous Once   gabapentin  200 mg Oral QHS   metoCLOPramide (REGLAN) injection  10 mg Intravenous Q6H    Continuous Infusions:  TPN (CLINIMIX-E) Adult     And   fat emul(SMOFlipid)     piperacillin-tazobactam (ZOSYN)  IV     sodium  bicarbonate 150 mEq in dextrose 5 % 1,150 mL infusion 50 mL/hr at 05/10/23 0842   TPN (CLINIMIX-E) Adult 82 mL/hr at 05/09/23 1731    PRN Meds:.albuterol, guaiFENesin-dextromethorphan, HYDROmorphone (DILAUDID) injection, ondansetron (ZOFRAN) IV, promethazine (PHENERGAN) injection (IM or IVPB), sodium chloride flush  Labs Reviewed: CBG ranges from 150-153 mg/dL over the last 24 hours     NUTRITION - FOCUSED PHYSICAL  EXAM:  Flowsheet Row Most Recent Value  Orbital Region Mild depletion  Upper Arm Region Mild depletion  Thoracic and Lumbar Region Unable to assess  [Fluid]  Buccal Region No depletion  Temple Region Moderate depletion  Clavicle Bone Region Unable to assess  [Fluid]  Clavicle and Acromion Bone Region Mild depletion  Scapular Bone Region Mild depletion  Dorsal Hand Mild depletion  Patellar Region Unable to assess  [Fluid]  Anterior Thigh Region Unable to assess  [Fluid]  Posterior Calf Region Unable to assess  [Fluid]  Edema (RD Assessment) Severe  [Abdomen and leg edema]  Hair Reviewed  Eyes Reviewed  Mouth Reviewed  Skin Reviewed  Nails Reviewed       Diet Order:   Diet Order             Diet NPO time specified Except for: Ice Chips  Diet effective now                   EDUCATION NEEDS:   Education needs have been addressed  Skin:  Skin Assessment: Reviewed RN Assessment Skin Integrity Issues:: Wound VAC Wound Vac: abdomen Other: new colostomy (11/18)  Last BM:  11/18 (prior to colostomy), no stool yet via colostomy  Height:   Ht Readings from Last 1 Encounters:  04/27/23 5' (1.524 m)    Weight:   Wt Readings from Last 1 Encounters:  05/10/23 82.6 kg    Ideal Body Weight:  45.45 kg  BMI:  Body mass index is 35.56 kg/m.  Estimated Nutritional Needs:   Kcal:  1600-1800 kcal  Protein:  75-95 gm  Fluid:  >1.6L    Jamelle Haring RDN, LDN Clinical Dietitian  RDN pager # available on Amion

## 2023-05-11 LAB — GLUCOSE, CAPILLARY
Glucose-Capillary: 140 mg/dL — ABNORMAL HIGH (ref 70–99)
Glucose-Capillary: 144 mg/dL — ABNORMAL HIGH (ref 70–99)
Glucose-Capillary: 146 mg/dL — ABNORMAL HIGH (ref 70–99)
Glucose-Capillary: 151 mg/dL — ABNORMAL HIGH (ref 70–99)
Glucose-Capillary: 157 mg/dL — ABNORMAL HIGH (ref 70–99)
Glucose-Capillary: 164 mg/dL — ABNORMAL HIGH (ref 70–99)
Glucose-Capillary: 169 mg/dL — ABNORMAL HIGH (ref 70–99)

## 2023-05-11 LAB — HEPARIN LEVEL (UNFRACTIONATED): Heparin Unfractionated: 0.33 [IU]/mL (ref 0.30–0.70)

## 2023-05-11 LAB — BRAIN NATRIURETIC PEPTIDE: B Natriuretic Peptide: 59.7 pg/mL (ref 0.0–100.0)

## 2023-05-11 LAB — BASIC METABOLIC PANEL
Anion gap: 9 (ref 5–15)
BUN: 22 mg/dL (ref 8–23)
CO2: 18 mmol/L — ABNORMAL LOW (ref 22–32)
Calcium: 8.5 mg/dL — ABNORMAL LOW (ref 8.9–10.3)
Chloride: 110 mmol/L (ref 98–111)
Creatinine, Ser: 0.54 mg/dL (ref 0.44–1.00)
GFR, Estimated: >60 mL/min (ref >60)
Glucose, Bld: 174 mg/dL — ABNORMAL HIGH (ref 70–99)
Potassium: 3.6 mmol/L (ref 3.5–5.1)
Sodium: 137 mmol/L (ref 135–145)

## 2023-05-11 LAB — CBC
HCT: 28.8 % — ABNORMAL LOW (ref 36.0–46.0)
Hemoglobin: 9.2 g/dL — ABNORMAL LOW (ref 12.0–15.0)
MCH: 26.5 pg (ref 26.0–34.0)
MCHC: 31.9 g/dL (ref 30.0–36.0)
MCV: 83 fL (ref 80.0–100.0)
Platelets: 292 10*3/uL (ref 150–400)
RBC: 3.47 MIL/uL — ABNORMAL LOW (ref 3.87–5.11)
RDW: 17.8 % — ABNORMAL HIGH (ref 11.5–15.5)
WBC: 12.3 10*3/uL — ABNORMAL HIGH (ref 4.0–10.5)
nRBC: 0.2 % (ref 0.0–0.2)

## 2023-05-11 MED ORDER — ACETAMINOPHEN 10 MG/ML IV SOLN
1000.0000 mg | Freq: Four times a day (QID) | INTRAVENOUS | Status: AC
  Administered 2023-05-11 – 2023-05-12 (×4): 1000 mg via INTRAVENOUS
  Filled 2023-05-11 (×4): qty 100

## 2023-05-11 MED ORDER — FAT EMUL FISH OIL/PLANT BASED 20% (SMOFLIPID)IV EMUL
250.0000 mL | INTRAVENOUS | Status: AC
  Administered 2023-05-11: 250 mL via INTRAVENOUS
  Filled 2023-05-11: qty 250

## 2023-05-11 MED ORDER — TRACE MINERALS CU-MN-SE-ZN 300-55-60-3000 MCG/ML IV SOLN
INTRAVENOUS | Status: AC
  Filled 2023-05-11 (×2): qty 1000

## 2023-05-11 MED ORDER — POTASSIUM CHLORIDE 10 MEQ/50ML IV SOLN
10.0000 meq | INTRAVENOUS | Status: AC
  Administered 2023-05-11 (×4): 10 meq via INTRAVENOUS
  Filled 2023-05-11 (×4): qty 50

## 2023-05-11 NOTE — Progress Notes (Signed)
PROGRESS NOTE        PATIENT DETAILS Name: Kathleen [name] Age: 77 y.o. Sex: female Date of Birth: April 07, 1946 Admit Date: 04/22/2023 Admitting Physician Gery Pray, MD ZOX:[id number], Bernadene Bell, MD  Brief Summary:  Patient is a 77 y.o.  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  Admitted by [facility]-started on empiric IV antibiotics-underwent preop assessment by cardiology-and then subsequently underwent  exploratory laparotomy with sigmoid colectomy and end ostomy on 11/19.  Postoperative course complicated by ileus-requiring NG tube decompression-and TNA for nutrition.  See below for further details.  Significant events: 11/14>> admit to [facility] 11/19>>exploratory laparotomy with sigmoid colectomy and end ostomy   Significant studies: 11/14>> CT abdomen/pelvis: Worsening signal right diverticulitis-localized perforation-5.6 cm perirectal abscess, 6.0 cm left lower quadrant abscess. 11/22>> x-ray abdomen: Consistent with postoperative ileus. 11/25>> CT abdomen: No abscess  Significant microbiology data: 11/14>> blood culture: No growth 11/14>> COVID/influenza/RSV PCR: Negative  Procedures: 11/19>>exploratory laparotomy with sigmoid colectomy  end ostomy on 11/19  Consults: Cardiology GI General surgery  Subjective:  Planes of lower back pain, NG tube with 2.2 will output, she started to have liquid stool in her ostomy bag overnight Objective: Vitals: Blood pressure (!) 131/56, pulse 73, temperature (!) 97.4 F (36.3 C), temperature source Oral, resp. rate 19, height 5' (1.524 m), weight 83.3 kg, SpO2 98%.   Exam:  Awake Alert, Oriented X 3, No new F.N deficits, Normal affect Symmetrical Chest wall movement, Good air movement bilaterally,  CTAB RRR,No Gallops,Rubs or new Murmurs, No Parasternal Heave +ve B.Sounds, Abd Soft, appropriately tender, JP drain present, x 2, wound covered, colostomy present with significant amount of liquid stool No Cyanosis, Clubbing or edema, No new Rash or bruise      Pertinent Labs/Radiology:    Latest Ref Rng & Units 05/11/2023    4:29 AM 05/10/2023    2:26 AM 05/09/2023    8:00 AM  CBC  WBC 4.0 - 10.5 K/uL 12.3  15.1  15.8   Hemoglobin 12.0 - 15.0 g/dL 9.2  9.0  7.7   Hematocrit 36.0 - 46.0 % 28.8  28.8  25.8   Platelets 150 - 400 K/uL 292  309  318     Lab Results  Component Value Date   NA 137 05/11/2023   K 3.6 05/11/2023   CL 110 05/11/2023   CO2 18 (L) 05/11/2023     Assessment/Plan:  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 with localized perforation and pelvic abscess-s/p ex lap with colectomy/ostomy on 11/19-complicated by postoperative ileus requiring NG tube decompression - Await recovery of bowel function-no stool in ostomy -Continue with TPN for now awaiting return of bowel function -Management per general surgery, repeat CT abdomen pelvis with no evidence of SBO or abscess, looks like ileus -Continue with pain management, nausea management -Mobilize, PT, OT, minimize narcotics -Continue with NGT suction -Discussed with cardiology on-call, and her QTc is reassuring can start Reglan, with close monitoring of QTc, repeat QTc this morning is reassuring, will continue to monitor QTc intermittently, and will DC Reglan as soon her bowel function is back . -she is having good liquid stool output today  Hypokalemia/hypophosphatemia Electrolyte management with TPN per pharmacy -Will need to target potassium more  than 4, magnesium more than 2 , she is on Tikosyn.  Chronic blood loss anemia Due to critical illness-Hb fluctuating but relatively stable, but overall trending down, it is down to 7.7 today, and she is having very small amount of changed  output in her colostomy, discussed with the patient, we will proceed with an unit PRBC transfusion given her count is expected to continue slowly trending down especially in the setting with her IV heparin. -Received 1 unit PRBC 12/1 -Transfuse as needed  Persistent atrial fibrillation Telemetry monitoring Tikosyn orally-okay to clamp NG tube for a few minutes Cautiously continue with IV heparin-see above. 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   Aldactone on hold-resume when oral intake is stable -Diuresis as needed, will give an extra dose of IV Lasix today.  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  GERD PPI  DM-2 (A1c 6.2 on 11/15) CBGs stable on SSI  Recent Labs    05/11/23 0034 05/11/23 0351 05/11/23 0752  GLUCAP 157* 151* 169*   Nutrition Status: Nutrition Problem: Moderate Malnutrition Etiology: chronic illness Signs/Symptoms: mild fat depletion, mild muscle depletion, energy intake < 75% for > 7 days Interventions: Ensure Enlive (each supplement provides 350kcal and 20 grams of protein), Magic cup, MVI  Obesity: Estimated body mass index is 35.87 kg/m as calculated from the following:   Height as of this encounter: 5' (1.524 m).   Weight as of this encounter: 83.3 kg.   Code status:   Code Status: Limited: Do not attempt resuscitation (DNR) -DNR-LIMITED -Do Not Intubate/DNI    DVT Prophylaxis: IV heparin   Family  Communication: Cussed with son at bedside 12/2   Disposition Plan: Status is: Inpatient Remains inpatient appropriate because: Severity of illness   Planned Discharge Destination:Skilled nursing facility   Diet: Diet Order             Diet NPO time specified Except for: Ice Chips  Diet effective now                     Antimicrobial agents: Anti-infectives (From admission, onward)    Start     Dose/Rate Route Frequency Ordered Stop   05/10/23 1000  piperacillin-tazobactam (ZOSYN) IVPB 3.375 g        3.375 g 12.5 mL/hr over 240 Minutes Intravenous Every 8 hours 05/10/23 0914     05/03/23 1400  piperacillin-tazobactam (ZOSYN) IVPB 3.375 g        3.375 g 12.5 mL/hr over 240 Minutes Intravenous Every 8 hours 05/03/23 0935 05/07/23 0124   04/27/23 1021  sodium chloride 0.9 % with cefoTEtan (CEFOTAN) ADS Med       Note to Pharmacy: Shanda Bumps M: cabinet override      04/27/23 1021 04/27/23 1253   04/26/23 0800  cefoTEtan (CEFOTAN) 2 g in sodium chloride 0.9 % 100 mL IVPB  Status:  Discontinued        2 g 200 mL/hr over 30 Minutes Intravenous To [facility] 04/23/23 2111 04/27/23 0800   04/22/23  2300  piperacillin-tazobactam (ZOSYN) IVPB 3.375 g        3.375 g 12.5 mL/hr over 240 Minutes Intravenous Every 8 hours 04/22/23 2256 05/02/23 2359   04/22/23 1900  piperacillin-tazobactam (ZOSYN) IVPB 3.375 g        3.375 g 100 mL/hr over 30 Minutes Intravenous  Once 04/22/23 1852 04/22/23 1954        MEDICATIONS: Scheduled Meds:  Chlorhexidine Gluconate Cloth  6 each Topical Daily   dofetilide  250 mcg Oral Q12H   fluticasone furoate-vilanterol  1 puff Inhalation Daily   gabapentin  200 mg Oral QHS   insulin aspart  0-15 Units Subcutaneous Q4H   lidocaine  1 patch Transdermal Q24H   magic mouthwash  5 mL Oral QID   methocarbamol (ROBAXIN) injection  500 mg Intravenous Q6H   metoCLOPramide (REGLAN) injection  10 mg Intravenous Q6H   mouth rinse  15 mL Mouth  Rinse 4 times per day   pantoprazole (PROTONIX) IV  40 mg Intravenous Q12H   revefenacin  175 mcg Nebulization Daily   Continuous Infusions:  acetaminophen     acetaminophen     TPN (CLINIMIX-E) Adult     And   fat emul(SMOFlipid)     heparin 1,900 Units/hr (05/09/23 2240)   piperacillin-tazobactam (ZOSYN)  IV 3.375 g (05/11/23 0910)   potassium chloride 10 mEq (05/11/23 1003)   promethazine (PHENERGAN) injection (IM or IVPB) Stopped (05/06/23 0334)   sodium bicarbonate 150 mEq in dextrose 5 % 1,150 mL infusion 50 mL/hr at 05/10/23 0842   TPN (CLINIMIX-E) Adult 42 mL/hr at 05/10/23 1745   PRN Meds:.albuterol, guaiFENesin-dextromethorphan, HYDROmorphone (DILAUDID) injection, ondansetron (ZOFRAN) IV, promethazine (PHENERGAN) injection (IM or IVPB), sodium chloride flush   I have personally reviewed following labs and imaging studies  LABORATORY DATA: CBC: Recent Labs  Lab 05/07/23 0413 05/08/23 0334 05/09/23 0800 05/10/23 0226 05/11/23 0429  WBC 17.5* 19.1* 15.8* 15.1* 12.3*  HGB 8.1* 7.9* 7.7* 9.0* 9.2*  HCT 27.4* 27.1* 25.8* 28.8* 28.8*  MCV 85.6 85.8 84.3 84.7 83.0  PLT 336 356 318 309 292    Basic Metabolic Panel: Recent Labs  Lab 05/05/23 0102 05/06/23 0346 05/07/23 0413 05/08/23 0334 05/09/23 0245 05/10/23 0226 05/11/23 0429  NA 133* 130* 133* 134* 132* 135 137  K 3.8 3.9 4.2 4.1 4.1 4.1 3.6  CL 102 103 108 111 108 111 110  CO2 21* 20* 18* 17* 17* 17* 18*  GLUCOSE 153* 173* 165* 170* 150* 153* 174*  BUN 22 19 21 22 22  30* 22  CREATININE 0.71 0.68 0.53 0.47 0.61 0.46 0.54  CALCIUM 8.4* 8.2* 8.4* 8.4* 8.6* 8.7* 8.5*  MG 2.2 2.2  --  2.1  --  2.3  --   PHOS 3.2 3.1  --  2.7  --  3.4  --     GFR: Estimated Creatinine Clearance: 57.2 mL/min (by C-G formula based on SCr of 0.54 mg/dL).  Liver Function Tests: Recent Labs  Lab 05/05/23 0102 05/06/23 0346  AST 15 12*  ALT 12 11  ALKPHOS 51 48  BILITOT 0.5 0.4  PROT 5.4* 5.4*  ALBUMIN 2.2* 2.2*    No results for input(s): "LIPASE", "AMYLASE" in the last 168 hours. No results for input(s): "AMMONIA" in the last 168 hours.  Coagulation Profile: No results for input(s): "INR", "PROTIME" in the last 168 hours.  Cardiac Enzymes: No results for input(s): "CKTOTAL", "CKMB", "CKMBINDEX", "TROPONINI" in the last 168 hours.  BNP (last 3 results) No results  for input(s): "PROBNP" in the last 8760 hours.  Lipid Profile: Recent Labs    05/10/23 0226  TRIG 154*     Thyroid Function Tests: No results for input(s): "TSH", "T4TOTAL", "FREET4", "T3FREE", "THYROIDAB" in the last 72 hours.  Anemia Panel: No results for input(s): "VITAMINB12", "FOLATE", "FERRITIN", "TIBC", "IRON", "RETICCTPCT" in the last 72 hours.  Urine analysis:    Component Value Date/Time   COLORURINE AMBER (A) 04/23/2023 0623   APPEARANCEUR CLOUDY (A) 04/23/2023 0623   APPEARANCEUR Hazy 12/25/2012 1653   LABSPEC 1.024 04/23/2023 0623   LABSPEC 1.021 12/25/2012 1653   PHURINE 5.0 04/23/2023 0623   GLUCOSEU NEGATIVE 04/23/2023 0623   GLUCOSEU NEGATIVE 03/17/2023 1003   HGBUR MODERATE (A) 04/23/2023 0623   BILIRUBINUR NEGATIVE 04/23/2023 0623   BILIRUBINUR Negative 12/25/2012 1653   KETONESUR NEGATIVE 04/23/2023 0623   PROTEINUR 30 (A) 04/23/2023 0623   UROBILINOGEN 0.2 03/17/2023 1003   NITRITE NEGATIVE 04/23/2023 0623   LEUKOCYTESUR LARGE (A) 04/23/2023 0623   LEUKOCYTESUR Negative 12/25/2012 1653    Sepsis Labs: Lactic Acid, Venous    Component Value Date/Time   LATICACIDVEN 0.8 04/23/2023 0114    MICROBIOLOGY: No results found for this or any previous visit (from the past 240 hour(s)).   RADIOLOGY STUDIES/RESULTS: DG Abd Portable 1V  Result Date: 05/09/2023 CLINICAL DATA:  [id number] with NG tube placement. EXAM: PORTABLE ABDOMEN - 1 VIEW COMPARISON:  CT earlier today at 2:01 a.m. FINDINGS: Similar to the CT, injury to curves to the left in the stomach with the tip in the proximal body of  stomach, well intragastric. Small bowel dilatation up to 4 cm in the upper abdomen continues to be seen. Left pleural effusion and overlying left lower lobe consolidation continue to obscure the hemidiaphragm. Left chest pacing system and wire insertions, right PICC stable with tip at the superior cavoatrial junction. IMPRESSION: 1. NG tube tip in the proximal body of stomach, well intragastric. 2. Small bowel dilatation up to 4 cm in the upper abdomen continues to be seen. 3. Left pleural effusion and overlying left lower lobe consolidation. Electronically Signed   By: Almira Bar M.D.   On: 05/09/2023 21:15     LOS: 19 days   Huey Bienenstock, MD  [facility]    To contact the attending provider between 7A-7P or the covering provider during after hours 7P-7A, please log into the web site [email or web address] and access using universal [place] password for that web site. If you do not have the password, please call the hospital operator.  05/11/2023, 11:25 AM

## 2023-05-11 NOTE — Progress Notes (Signed)
Occupational Therapy Treatment Patient Details Name: Kathleen [name] MRN: [id number] DOB: [birth year] Today's Date: 05/11/2023   History of present illness 77 y.o. 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 S/p exploratory laparotomy with sigmoid colectomy and end left colostomy by Dr. Dwain Sarna on 04/27/2023.  PMHx: colovaginal fistula associated with diverticulitis, obesity, OSA , BOOP, tracheobronchomalacia, chronic respiratory failure on 4 to 5 L oxygen at baseline, persistent A-fib S/P pacemaker implant 2017, chronic systolic heart failure, recent admission from 04/09/2023 to 04/13/2023.   OT comments  Pt c/o pain in abdomen, feeling nauseated, able to fully participate in session. Pt requires increased time due to amount of lines for transfer from bed to [facility], and to recliner. Pt assisted with CGA using HHA. Pt able to wipe with set up/CGA standing. Pt would benefit from continued skilled therapy to maximize independence and progress as able, DC plan still appropriate.       If plan is discharge home, recommend the following:  A little help with walking and/or transfers;A lot of help with bathing/dressing/bathroom;Assistance with cooking/housework;Assist for transportation;Help with stairs or ramp for entrance   Equipment Recommendations  None recommended by OT    Recommendations for Other Services      Precautions / Restrictions Precautions Precautions: Fall Precaution Comments: multiple drains, NGT, wound vac Required Braces or Orthoses: Other Brace Other Brace: Abdominal binder Restrictions Weight Bearing Restrictions: No       Mobility Bed Mobility Overal bed mobility: Modified Independent             General bed mobility comments: increased time, uses bed rails    Transfers Overall transfer level: Needs assistance Equipment used: 2 person hand held assist Transfers: Bed to chair/wheelchair/BSC, Sit to/from Stand Sit  to Stand: Contact guard assist     Step pivot transfers: Contact guard assist     General transfer comment: CGA, 2 person HHA     Balance Overall balance assessment: Needs assistance Sitting-balance support: No upper extremity supported, Feet supported Sitting balance-Leahy Scale: Good Sitting balance - Comments: sitting EOB   Standing balance support: Single extremity supported, During functional activity Standing balance-Leahy Scale: Fair Standing balance comment: requires UE support for stability                           ADL either performed or assessed with clinical judgement   ADL Overall ADL's : Needs assistance/impaired     Grooming: Set up;Sitting                   Toilet Transfer: Contact guard assist;Stand-pivot;BSC/3in1   Toileting- Clothing Manipulation and Hygiene: Contact guard assist;Sit to/from stand         General ADL Comments: CGA for transfer to [facility], able to wipe with set up/CGA standing    Extremity/Trunk Assessment              Vision       Perception     Praxis      Cognition Arousal: Alert Behavior During Therapy: WFL for tasks assessed/performed Overall Cognitive Status: Within Functional Limits for tasks assessed                                          Exercises      Shoulder Instructions       General Comments  Pertinent Vitals/ Pain       Pain Assessment Pain Assessment: 0-10 Faces Pain Scale: Hurts little more Pain Location: abdomen Pain Descriptors / Indicators: Grimacing, Discomfort Pain Intervention(s): Monitored during session  Home Living                                          Prior Functioning/Environment              Frequency  Min 1X/week        Progress Toward Goals  OT Goals(current goals can now be found in the care plan section)  Progress towards OT goals: Progressing toward goals  Acute Rehab OT Goals Patient Stated  Goal: to manage pain OT Goal Formulation: With patient Time For Goal Achievement: 05/12/23 Potential to Achieve Goals: Good ADL Goals Pt Will Perform Grooming: with contact guard assist;standing Pt Will Perform Upper Body Bathing: with min assist;sitting Pt Will Perform Upper Body Dressing: with supervision;sitting Pt Will Transfer to Toilet: with contact guard assist;ambulating;bedside commode Pt Will Perform Toileting - Clothing Manipulation and hygiene: with min assist;sit to/from stand Pt/caregiver will Perform Home Exercise Program: Increased strength;Both right and left upper extremity;With theraband;Independently Additional ADL Goal #1: Pt will complete bed mobility using log roll technique with min assist in preparation for ADLs. Additional ADL Goal #2: Pt will engage in 15 min functional activities without loss of sitting or standing balance and taking brief frequent rest breaks, sitting or standing to demonstrate pacing to avoid over-exhaustion, in order to show  improved activity tolerance and balance needed to perform ADLs safely at home.  Plan      Co-evaluation                 AM-PAC OT "6 Clicks" Daily Activity     Outcome Measure   Help from another person eating meals?: None Help from another person taking care of personal grooming?: A Little Help from another person toileting, which includes using toliet, bedpan, or urinal?: A Lot Help from another person bathing (including washing, rinsing, drying)?: Total Help from another person to put on and taking off regular upper body clothing?: A Lot Help from another person to put on and taking off regular lower body clothing?: Total 6 Click Score: 13    End of Session    OT Visit Diagnosis: Unsteadiness on feet (R26.81);Other abnormalities of gait and mobility (R26.89);Muscle weakness (generalized) (M62.81);Pain   Activity Tolerance     Patient Left     Nurse Communication          Time: 4098-1191 OT Time  Calculation (min): 15 min  Charges: OT General Charges $OT Visit: 1 Visit OT Treatments $Self Care/Home Management : 8-22 mins  [place], OTR/L   Alexis Goodell 05/11/2023, 12:19 PM

## 2023-05-11 NOTE — Progress Notes (Signed)
[facility] Progress Note  14 Days Post-Op  Subjective: CC-  Continues to have mostly intermittent back pain. Started having stool per ostomy over night. NG with 2.2 L out last 24 hours. Started reglan yesterday. WBC down 12.3  Objective: Vital signs in last 24 hours: Temp:  [97.4 F (36.3 C)-98 F (36.7 C)] 97.4 F (36.3 C) (12/03 0754) Pulse Rate:  [70-76] 73 (12/03 0754) Resp:  [18-21] 19 (12/03 0754) BP: (130-149)/(56-65) 131/56 (12/03 0754) SpO2:  [98 %-100 %] 98 % (12/03 0754) Weight:  [83.3 kg] 83.3 kg (12/03 0500) Last BM Date : 05/10/23  Intake/Output from previous day: 12/02 0701 - 12/03 0700 In: -  Out: 3115 [Urine:850; Emesis/NG output:2200; Drains:65] Intake/Output this shift: No intake/output data recorded.  PE: Gen:  Alert, NAD Card:  RRR Pulm:  rate and effort normal on supplemental O2 via [place] Abd: soft, appropriately tender, open midline stable with few areas of breakdown but fascia appears intact/ otherwise mostly healthy pink granulation tissue, stoma pink with thin light brown stool in bag, JP #1 tan, JP #2 serosanguinous  Lab Results:  Recent Labs    05/10/23 0226 05/11/23 0429  WBC 15.1* 12.3*  HGB 9.0* 9.2*  HCT 28.8* 28.8*  PLT 309 292   BMET Recent Labs    05/10/23 0226 05/11/23 0429  NA 135 137  K 4.1 3.6  CL 111 110  CO2 17* 18*  GLUCOSE 153* 174*  BUN 30* 22  CREATININE 0.46 0.54  CALCIUM 8.7* 8.5*   PT/INR No results for input(s): "LABPROT", "INR" in the last 72 hours. CMP     Component Value Date/Time   NA 137 05/11/2023 0429   NA 130 (L) 12/28/2012 0748   K 3.6 05/11/2023 0429   K 4.6 12/28/2012 0748   CL 110 05/11/2023 0429   CL 95 (L) 12/28/2012 0748   CO2 18 (L) 05/11/2023 0429   CO2 23 12/28/2012 0748   GLUCOSE 174 (H) 05/11/2023 0429   GLUCOSE 391 (H) 12/28/2012 0748   BUN 22 05/11/2023 0429   BUN 32 (H) 12/28/2012 0748   CREATININE 0.54 05/11/2023 0429   CREATININE 0.86 01/27/2023 1137    CALCIUM 8.5 (L) 05/11/2023 0429   CALCIUM 14.3 (HH) 06/26/2019 1618   PROT 5.4 (L) 05/06/2023 0346   ALBUMIN 2.2 (L) 05/06/2023 0346   AST 12 (L) 05/06/2023 0346   ALT 11 05/06/2023 0346   ALKPHOS 48 05/06/2023 0346   BILITOT 0.4 05/06/2023 0346   GFRNONAA >60 05/11/2023 0429   GFRNONAA 43 (L) 12/28/2012 0748   GFRAA 58 (L) 06/28/2019 0427   GFRAA 50 (L) 12/28/2012 0748   Lipase     Component Value Date/Time   LIPASE 30 04/22/2023 1840       Studies/Results: DG Abd Portable 1V  Result Date: 05/09/2023 CLINICAL DATA:  [id number] with NG tube placement. EXAM: PORTABLE ABDOMEN - 1 VIEW COMPARISON:  CT earlier today at 2:01 a.m. FINDINGS: Similar to the CT, injury to curves to the left in the stomach with the tip in the proximal body of stomach, well intragastric. Small bowel dilatation up to 4 cm in the upper abdomen continues to be seen. Left pleural effusion and overlying left lower lobe consolidation continue to obscure the hemidiaphragm. Left chest pacing system and wire insertions, right PICC stable with tip at the superior cavoatrial junction. IMPRESSION: 1. NG tube tip in the proximal body of stomach, well intragastric. 2. Small bowel dilatation up to 4 cm in the  upper abdomen continues to be seen. 3. Left pleural effusion and overlying left lower lobe consolidation. Electronically Signed   By: Almira Bar M.D.   On: 05/09/2023 21:15    Anti-infectives: Anti-infectives (From admission, onward)    Start     Dose/Rate Route Frequency Ordered Stop   05/10/23 1000  piperacillin-tazobactam (ZOSYN) IVPB 3.375 g        3.375 g 12.5 mL/hr over 240 Minutes Intravenous Every 8 hours 05/10/23 0914     05/03/23 1400  piperacillin-tazobactam (ZOSYN) IVPB 3.375 g        3.375 g 12.5 mL/hr over 240 Minutes Intravenous Every 8 hours 05/03/23 0935 05/07/23 0124   04/27/23 1021  sodium chloride 0.9 % with cefoTEtan (CEFOTAN) ADS Med       Note to Pharmacy: Shanda Bumps M: cabinet override       04/27/23 1021 04/27/23 1253   04/26/23 0800  cefoTEtan (CEFOTAN) 2 g in sodium chloride 0.9 % 100 mL IVPB  Status:  Discontinued        2 g 200 mL/hr over 30 Minutes Intravenous To [facility] 04/23/23 2111 04/27/23 0800   04/22/23 2300  piperacillin-tazobactam (ZOSYN) IVPB 3.375 g        3.375 g 12.5 mL/hr over 240 Minutes Intravenous Every 8 hours 04/22/23 2256 05/02/23 2359   04/22/23 1900  piperacillin-tazobactam (ZOSYN) IVPB 3.375 g        3.375 g 100 mL/hr over 30 Minutes Intravenous  Once 04/22/23 1852 04/22/23 1954        Assessment/Plan POD #14  elap, sigmoid colectomy, colostomy 11/19- MW - CT 12/1 ileus, small fluid collection near colonic stump 3.5x1.6cm. JP drain #1 tan/purulent, continue drains. Zosyn restarted 12/2 - Starting to open up but also still with high NG output. Continue NG to LIWS this morning. Will discuss with MD, consider clamping trial vs just removing NG later today if NG output slowing down - continue reglan - Continue PICC/TPN 11/22 -->  - WOC following for new ostomy. Stoma with some mucocutaneous separation, monitor. Will need ostomy clinic referral at discharge  - vac unable to hold seal and was removed, continue BID wet to dry dressing changes - Leukocytosis improving 12.3, afebrile. Monitor - continue IV tylenol - oob, pulm toilet   ID zosyn 11/14>>11/29, 12/2>> VTE - heparin heparin gtt for afib  FEN - TPN, NPO/NGT to LIWS     LOS: 19 days    Franne Forts, [facility] 05/11/2023, 8:40 AM Please see Amion for pager number during day hours 7:00am-4:30pm

## 2023-05-11 NOTE — Plan of Care (Signed)
  Problem: Health Behavior/Discharge Planning: Goal: Ability to identify and utilize available resources and services will improve Outcome: Progressing   Problem: Metabolic: Goal: Ability to maintain appropriate glucose levels will improve Outcome: Progressing   Problem: Skin Integrity: Goal: Risk for impaired skin integrity will decrease Outcome: Progressing   Problem: Activity: Goal: Risk for activity intolerance will decrease Outcome: Progressing   Problem: Safety: Goal: Ability to remain free from injury will improve Outcome: Progressing

## 2023-05-11 NOTE — Plan of Care (Signed)
  Problem: Education: Goal: Ability to describe self-care measures that may prevent or decrease complications (Diabetes Survival Skills Education) will improve Outcome: Progressing Goal: Individualized Educational Video(s) Outcome: Progressing   Problem: Coping: Goal: Ability to adjust to condition or change in health will improve Outcome: Progressing   Problem: Fluid Volume: Goal: Ability to maintain a balanced intake and output will improve Outcome: Progressing   Problem: Health Behavior/Discharge Planning: Goal: Ability to identify and utilize available resources and services will improve Outcome: Progressing Goal: Ability to manage health-related needs will improve Outcome: Progressing   Problem: Metabolic: Goal: Ability to maintain appropriate glucose levels will improve Outcome: Progressing   Problem: Nutritional: Goal: Maintenance of adequate nutrition will improve Outcome: Progressing Goal: Progress toward achieving an optimal weight will improve Outcome: Progressing   Problem: Skin Integrity: Goal: Risk for impaired skin integrity will decrease Outcome: Progressing   Problem: Tissue Perfusion: Goal: Adequacy of tissue perfusion will improve Outcome: Progressing   Problem: Education: Goal: Knowledge of General Education information will improve Description: Including pain rating scale, medication(s)/side effects and non-pharmacologic comfort measures Outcome: Progressing   Problem: Health Behavior/Discharge Planning: Goal: Ability to manage health-related needs will improve Outcome: Progressing   Problem: Clinical Measurements: Goal: Ability to maintain clinical measurements within normal limits will improve Outcome: Progressing Goal: Will remain free from infection Outcome: Progressing Goal: Diagnostic test results will improve Outcome: Progressing Goal: Cardiovascular complication will be avoided Outcome: Progressing   Problem: Activity: Goal: Risk  for activity intolerance will decrease Outcome: Progressing   Problem: Nutrition: Goal: Adequate nutrition will be maintained Outcome: Progressing   Problem: Coping: Goal: Level of anxiety will decrease Outcome: Progressing   Problem: Elimination: Goal: Will not experience complications related to bowel motility Outcome: Progressing Goal: Will not experience complications related to urinary retention Outcome: Progressing   Problem: Pain Management: Goal: General experience of comfort will improve Outcome: Progressing   Problem: Safety: Goal: Ability to remain free from injury will improve Outcome: Progressing   Problem: Skin Integrity: Goal: Risk for impaired skin integrity will decrease Outcome: Progressing   Problem: Education: Goal: Understanding of discharge needs will improve Outcome: Progressing Goal: Verbalization of understanding of the causes of altered bowel function will improve Outcome: Progressing   Problem: Activity: Goal: Ability to tolerate increased activity will improve Outcome: Progressing   Problem: Bowel/Gastric: Goal: Gastrointestinal status for postoperative course will improve Outcome: Progressing   Problem: Health Behavior/Discharge Planning: Goal: Identification of community resources to assist with postoperative recovery needs will improve Outcome: Progressing   Problem: Nutritional: Goal: Will attain and maintain optimal nutritional status will improve Outcome: Progressing   Problem: Clinical Measurements: Goal: Postoperative complications will be avoided or minimized Outcome: Progressing   Problem: Respiratory: Goal: Respiratory status will improve Outcome: Progressing   Problem: Skin Integrity: Goal: Will show signs of wound healing Outcome: Progressing

## 2023-05-11 NOTE — Progress Notes (Signed)
PHARMACY - ANTICOAGULATION CONSULT NOTE  Pharmacy Consult for Heparin gtt Indication: atrial fibrillation  Patient Measurements: Height: 5' (152.4 cm) Weight: 83.3 kg (183 lb 10.3 oz) IBW/kg (Calculated) : 45.5 Heparin Dosing Weight: 64 kg  Vital Signs: Temp: 98 F (36.7 C) (12/03 0350) Temp Source: Oral (12/03 0350) BP: 139/62 (12/03 0350) Pulse Rate: 76 (12/03 0350)  Labs: Recent Labs    05/09/23 0245 05/09/23 0800 05/09/23 0800 05/10/23 0226 05/11/23 0429  HGB  --  7.7*   < > 9.0* 9.2*  HCT  --  25.8*  --  28.8* 28.8*  PLT  --  318  --  309 292  HEPARINUNFRC 0.42  --   --  0.41 0.33  CREATININE 0.61  --   --  0.46 0.54   < > = values in this interval not displayed.   Estimated Creatinine Clearance: 57.2 mL/min (by C-G formula based on SCr of 0.54 mg/dL).  Assessment: [name] with history of atrial fibrillation on Xarelto prior to admission. Last dose of Xarelto 04/21/23 at 1900. Admitted for recurrent diverticulitis, perforation, and perirectal abscess. 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 Cleared by surgery to start hep gtt for afib.  Heparin remains therapeutic. S/p PRBC>>hgb 9s. Cont current rate and monitor for bleeding.   Goal of Therapy:  Heparin level 0.3-0.7 units/ml Monitor platelets by anticoagulation protocol: Yes   Plan:  No bolus per surgery Continue heparin at 1900 units/hr Heparin level daily/CBC  Monitor for signs/symptoms of bleeding  Ulyses Southward, PharmD, BCIDP, AAHIVP, CPP Infectious Disease Pharmacist 05/11/2023 7:23 AM

## 2023-05-11 NOTE — Progress Notes (Signed)
PHARMACY - TOTAL PARENTERAL NUTRITION CONSULT NOTE  Indication: Prolonged ileus  Patient Measurements: Height: 5' (152.4 cm) Weight: 83.3 kg (183 lb 10.3 oz) IBW/kg (Calculated) : 45.5 TPN AdjBW (KG): 54 Body mass index is 35.87 kg/m. 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 Patient has been NPO since surgery. Pharmacy consulted to manage TPN.  Glucose / Insulin: hx T2DM, A1c 6.2%; no meds PTA - CBGs < 180 Used 7 units mSSI/24 hours  Electrolytes: all WNL except low CO2 (on bicarb gtt), K down to 3.6 with Lasix Renal: SCr <1, BUN down to 20s Hepatic: LFTs / tbili WNL, TG slightly elevated at 154, albumin 2.2 Intake / Output; MIVF: UOP 0.4 ml/kg/hr, last Lasix 12/2. NG , JP drain 65mL, colostomy output 0mL GI Imaging: 11/21 KUB moderately dilated loops of small bowel, c/w post-operative ileus 11/23 KUB: persistent SBO or ileus, no significant change from prior 11/25 KUB: bowel obstruction or ileus, pleural effusion vs atelectasis 11/25 CTAP: post-op ileus or distal SBO, trace L pleural effusion 11/26 CXR: L > R lower lobe atelectasis 11/29 Abd XR: distention of SB concerning for obstruction or worsening postop ileus 12/1 CT abd: mild ileus GI Surgeries / Procedures:  11/19 ex lap with sigmoid colectomy and end left colostomy  Central access: PICC placed 04/30/23 TPN start date: 04/30/23  Nutritional Goals: Clinimix E 8/10 1L at 42 ml/hr with SMOF lipids on MTWThFSa Clinimix E 8/10 2L at 82 ml/hr on Sun with SMOFlipids - Average of 1258 kCals (~80% of goal) and 91g AA per day (100% goal) Previously on D10 fluids for add'l kCal >> stopped on 11/26 given concerns for pleural effusions/atelectasis noted on  imaging, weights increased requiring Lasix IV - this has resolved so restarted D10 at 11/29, now changed to sodium bicarb D5 gtt   Electrolytes in Clinimix E 8/10 1L bag: Na 35 mEq, K 30 mEq, Mag 5 mEq, Ca 4.5 mEq, Phos , Acetate 83, Cl 76 mEq  RD Estimated Needs Total Energy Estimated Needs: 1600-1800 kcal Total Protein Estimated Needs: 75-95 gm Total Fluid Estimated Needs: >1.6L  Current Nutrition:  TPN   Plan:  Clinimix E 8/10 1L at 42 mL/hr and SMOFlipids at 20.8 ml/hr x 12 hrs on MTWThFSa Clinimix E 8/10 2L at 82 mL/hr and SMOFlipids at 20.8 ml/hr x 12 hrs on Sunday TPN providing an average of 1258 kCals (~80% of goal) and 91g AA per day (100% goal) Bicarb gtt in D5W at 50 ml/hr per MD, providing 204 kCal per day Continue standard MVI and trace elements to TPN Continue moderate SSI Q4H KCL x 4 runs Monitor TPN labs Mon and Thur and PRN - BMET in AM  Brayan Votaw D. Laney Potash, PharmD, BCPS, BCCCP 05/11/2023, 7:08 AM

## 2023-05-11 NOTE — Progress Notes (Signed)
Physical Therapy Treatment Patient Details Name: Kathleen [name] MRN: [id number] DOB: 1946-03-03 Today's Date: 05/11/2023   History of Present Illness 77 y.o. 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 S/p exploratory laparotomy with sigmoid colectomy and end left colostomy by Dr. Dwain Sarna on 04/27/2023.  PMHx: colovaginal fistula associated with diverticulitis, obesity, OSA , BOOP, tracheobronchomalacia, chronic respiratory failure on 4 to 5 L oxygen at baseline, persistent A-fib S/P pacemaker implant 2017, chronic systolic heart failure, recent admission from 04/09/2023 to 04/13/2023.    PT Comments  Pt received in supine and agreeable to session. Pt requests to use the [facility] and is able to void and perform pericare without assist. Pt requires UE support during transfers for balance. Pt able to tolerate standing marching, however declines further mobility due to increased fatigue. Pt continues to benefit from PT services to progress toward functional mobility goals.     If plan is discharge home, recommend the following: A lot of help with walking and/or transfers;A lot of help with bathing/dressing/bathroom;Assistance with cooking/housework;Assist for transportation   Can travel by private vehicle     No  Equipment Recommendations  None recommended by PT    Recommendations for Other Services       Precautions / Restrictions Precautions Precautions: Fall Precaution Comments: multiple drains, NGT, wound vac Required Braces or Orthoses: Other Brace Other Brace: Abdominal binder Restrictions Weight Bearing Restrictions: No     Mobility  Bed Mobility Overal bed mobility: Modified Independent             General bed mobility comments: increased time, uses bed rails    Transfers Overall transfer level: Needs assistance Equipment used: 2 person hand held assist Transfers: Bed to chair/wheelchair/BSC, Sit to/from Stand Sit to Stand:  Contact guard assist   Step pivot transfers: Contact guard assist       General transfer comment: increased time and HHA for balance        Balance Overall balance assessment: Needs assistance Sitting-balance support: No upper extremity supported, Feet supported Sitting balance-Leahy Scale: Good Sitting balance - Comments: sitting EOB   Standing balance support: Single extremity supported, During functional activity Standing balance-Leahy Scale: Fair Standing balance comment: requires UE support for stability                            Cognition Arousal: Alert Behavior During Therapy: WFL for tasks assessed/performed Overall Cognitive Status: Within Functional Limits for tasks assessed                                          Exercises General Exercises - Lower Extremity Hip Flexion/Marching: AROM, Both, 10 reps, Standing    General Comments        Pertinent Vitals/Pain Pain Assessment Pain Assessment: Faces Faces Pain Scale: Hurts little more Pain Location: abdomen Pain Descriptors / Indicators: Grimacing, Discomfort Pain Intervention(s): Monitored during session, Repositioned     PT Goals (current goals can now be found in the care plan section) Acute Rehab PT Goals Patient Stated Goal: Reduce pain PT Goal Formulation: With patient Time For Goal Achievement: 05/12/23 Progress towards PT goals: Progressing toward goals    Frequency    Min 1X/week       AM-PAC PT "6 Clicks" Mobility   Outcome Measure  Help needed turning from your back to  your side while in a flat bed without using bedrails?: None Help needed moving from lying on your back to sitting on the side of a flat bed without using bedrails?: None Help needed moving to and from a bed to a chair (including a wheelchair)?: A Little Help needed standing up from a chair using your arms (e.g., wheelchair or bedside chair)?: A Little Help needed to walk in hospital  room?: A Lot Help needed climbing 3-5 steps with a railing? : Total 6 Click Score: 17    End of Session Equipment Utilized During Treatment: Oxygen Activity Tolerance: Patient limited by pain;Patient limited by fatigue Patient left: in chair;with call bell/phone within reach Nurse Communication: Mobility status PT Visit Diagnosis: Unsteadiness on feet (R26.81);Muscle weakness (generalized) (M62.81);Difficulty in walking, not elsewhere classified (R26.2);Pain     Time: 1610-9604 PT Time Calculation (min) (ACUTE ONLY): 14 min  Charges:    $Therapeutic Activity: 8-22 mins PT General Charges $$ ACUTE PT VISIT: 1 Visit                     Johny Shock, PTA Acute Rehabilitation Services Secure Chat Preferred  [phone]    Johny Shock 05/11/2023, 12:43 PM

## 2023-05-12 DIAGNOSIS — E1140 Type 2 diabetes mellitus with diabetic neuropathy, unspecified: Secondary | ICD-10-CM | POA: Diagnosis not present

## 2023-05-12 DIAGNOSIS — K5720 Diverticulitis of large intestine with perforation and abscess without bleeding: Secondary | ICD-10-CM | POA: Diagnosis not present

## 2023-05-12 DIAGNOSIS — E440 Moderate protein-calorie malnutrition: Secondary | ICD-10-CM | POA: Diagnosis not present

## 2023-05-12 DIAGNOSIS — E039 Hypothyroidism, unspecified: Secondary | ICD-10-CM | POA: Diagnosis not present

## 2023-05-12 LAB — BASIC METABOLIC PANEL
Anion gap: 6 (ref 5–15)
BUN: 15 mg/dL (ref 8–23)
CO2: 22 mmol/L (ref 22–32)
Calcium: 7.9 mg/dL — ABNORMAL LOW (ref 8.9–10.3)
Chloride: 107 mmol/L (ref 98–111)
Creatinine, Ser: 0.47 mg/dL (ref 0.44–1.00)
GFR, Estimated: >60 mL/min (ref >60)
Glucose, Bld: 150 mg/dL — ABNORMAL HIGH (ref 70–99)
Potassium: 3.4 mmol/L — ABNORMAL LOW (ref 3.5–5.1)
Sodium: 135 mmol/L (ref 135–145)

## 2023-05-12 LAB — CBC
HCT: 26.5 % — ABNORMAL LOW (ref 36.0–46.0)
Hemoglobin: 8.3 g/dL — ABNORMAL LOW (ref 12.0–15.0)
MCH: 26.3 pg (ref 26.0–34.0)
MCHC: 31.3 g/dL (ref 30.0–36.0)
MCV: 84.1 fL (ref 80.0–100.0)
Platelets: 251 10*3/uL (ref 150–400)
RBC: 3.15 MIL/uL — ABNORMAL LOW (ref 3.87–5.11)
RDW: 17.8 % — ABNORMAL HIGH (ref 11.5–15.5)
WBC: 8.9 10*3/uL (ref 4.0–10.5)
nRBC: 0.2 % (ref 0.0–0.2)

## 2023-05-12 LAB — GLUCOSE, CAPILLARY
Glucose-Capillary: 141 mg/dL — ABNORMAL HIGH (ref 70–99)
Glucose-Capillary: 133 mg/dL — ABNORMAL HIGH (ref 70–99)
Glucose-Capillary: 142 mg/dL — ABNORMAL HIGH (ref 70–99)
Glucose-Capillary: 159 mg/dL — ABNORMAL HIGH (ref 70–99)
Glucose-Capillary: 138 mg/dL — ABNORMAL HIGH (ref 70–99)

## 2023-05-12 LAB — BRAIN NATRIURETIC PEPTIDE: B Natriuretic Peptide: 135.6 pg/mL — ABNORMAL HIGH (ref 0.0–100.0)

## 2023-05-12 LAB — HEPARIN LEVEL (UNFRACTIONATED): Heparin Unfractionated: 0.53 [IU]/mL (ref 0.30–0.70)

## 2023-05-12 MED ORDER — FAT EMUL FISH OIL/PLANT BASED 20% (SMOFLIPID)IV EMUL
250.0000 mL | INTRAVENOUS | Status: AC
  Administered 2023-05-12: 250 mL via INTRAVENOUS
  Filled 2023-05-12: qty 250

## 2023-05-12 MED ORDER — ACETAMINOPHEN 325 MG PO TABS
650.0000 mg | ORAL_TABLET | Freq: Four times a day (QID) | ORAL | Status: DC
  Administered 2023-05-12 – 2023-05-27 (×42): 650 mg via ORAL
  Filled 2023-05-12 (×45): qty 2

## 2023-05-12 MED ORDER — TRACE MINERALS CU-MN-SE-ZN 300-55-60-3000 MCG/ML IV SOLN
INTRAVENOUS | Status: AC
  Filled 2023-05-12 (×2): qty 1000

## 2023-05-12 MED ORDER — INSULIN ASPART 100 UNIT/ML IJ SOLN
0.0000 [IU] | INTRAMUSCULAR | Status: DC
  Administered 2023-05-12: 2 [IU] via SUBCUTANEOUS
  Administered 2023-05-12 (×3): 1 [IU] via SUBCUTANEOUS
  Administered 2023-05-13 (×3): 2 [IU] via SUBCUTANEOUS
  Administered 2023-05-13 (×3): 1 [IU] via SUBCUTANEOUS
  Administered 2023-05-14: 2 [IU] via SUBCUTANEOUS

## 2023-05-12 MED ORDER — POTASSIUM CHLORIDE 10 MEQ/50ML IV SOLN
10.0000 meq | INTRAVENOUS | Status: AC
  Administered 2023-05-12 (×6): 10 meq via INTRAVENOUS
  Filled 2023-05-12 (×6): qty 50

## 2023-05-12 NOTE — Plan of Care (Signed)
  Problem: Education: Goal: Ability to describe self-care measures that may prevent or decrease complications (Diabetes Survival Skills Education) will improve Outcome: Progressing Goal: Individualized Educational Video(s) Outcome: Progressing   Problem: Coping: Goal: Ability to adjust to condition or change in health will improve Outcome: Progressing   Problem: Fluid Volume: Goal: Ability to maintain a balanced intake and output will improve Outcome: Progressing   Problem: Health Behavior/Discharge Planning: Goal: Ability to identify and utilize available resources and services will improve Outcome: Progressing Goal: Ability to manage health-related needs will improve Outcome: Progressing   Problem: Skin Integrity: Goal: Risk for impaired skin integrity will decrease Outcome: Progressing   Problem: Education: Goal: Knowledge of General Education information will improve Description: Including pain rating scale, medication(s)/side effects and non-pharmacologic comfort measures Outcome: Progressing   Problem: Clinical Measurements: Goal: Will remain free from infection Outcome: Progressing   Problem: Elimination: Goal: Will not experience complications related to bowel motility Outcome: Progressing Goal: Will not experience complications related to urinary retention Outcome: Progressing

## 2023-05-12 NOTE — Progress Notes (Signed)
PROGRESS NOTE        PATIENT DETAILS Name: Kathleen [name] Age: 77 y.o. Sex: female Date of Birth: [birth year] Admit Date: 04/22/2023 Admitting Physician Gery Pray, MD [id number], Bernadene Bell, MD  Brief Summary:  Patient is a 77 y.o.  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  Admitted by [facility]-started on empiric IV antibiotics-underwent preop assessment by cardiology-and then subsequently underwent  exploratory laparotomy with sigmoid colectomy and end ostomy on 11/19.  Postoperative course complicated by ileus-requiring NG tube decompression-and TNA for nutrition.  See below for further details.  Significant events: 11/14>> admit to [facility] 11/19>>exploratory laparotomy with sigmoid colectomy and end ostomy   Significant studies: 11/14>> CT abdomen/pelvis: Worsening signal right diverticulitis-localized perforation-5.6 cm perirectal abscess, 6.0 cm left lower quadrant abscess. 11/22>> x-ray abdomen: Consistent with postoperative ileus. 11/25>> CT abdomen/pelvis: No abscess 12/01>> CT abdomen/pelvis: Mild small bowel ileus-no obvious abscess-small fluid collection in the colonic stump  Significant microbiology data: 11/14>> blood culture: No growth 11/14>> COVID/influenza/RSV PCR: Negative  Procedures: 11/19>>exploratory laparotomy with sigmoid colectomy  end ostomy on 11/19  Consults: Cardiology GI General surgery  Subjective: Soft brown stool in ostomy bag this morning.  In better spirits.  Objective: Vitals: Blood pressure (!) 112/47, pulse 73, temperature 97.7 F (36.5 C), temperature source Oral, resp. rate 14, height 5' (1.524 m), weight 83.3 kg, SpO2 99%.   Exam: Gen Exam:Alert awake-not in any distress HEENT:atraumatic,  normocephalic Chest: Mostly clear to auscultation-some scattered upper airway sounds. CVS:S1S2 regular Abdomen:soft non tender, non distended Extremities:no edema Neurology: Non focal Skin: no rash   Pertinent Labs/Radiology:    Latest Ref Rng & Units 05/12/2023    3:42 AM 05/11/2023    4:29 AM 05/10/2023    2:26 AM  CBC  WBC 4.0 - 10.5 K/uL 8.9  12.3  15.1   Hemoglobin 12.0 - 15.0 g/dL 8.3  9.2  9.0   Hematocrit 36.0 - 46.0 % 26.5  28.8  28.8   Platelets 150 - 400 K/uL 251  292  309     Lab Results  Component Value Date   NA 135 05/12/2023   K 3.4 (L) 05/12/2023   CL 107 05/12/2023   CO2 22 05/12/2023     Assessment/Plan:  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 with localized perforation and pelvic abscess-s/p ex lap with colectomy/ostomy on 11/19-complicated by postoperative ileus requiring NG tube decompression Slowly improving-ileus now resolving-Brown stools in ostomy bag General Surgery starting NG tube clamping trial-with plans to initiate clear liquid if clamping trial is successful. Continue TNA per pharmacy Remains on Reglan-prior MD discussed with cardiology-QTc stable-okay to continue Reglan. Will discuss with general surgery to see if patient still requires Zosyn-was restarted on 12/2.  Hypokalemia/hypophosphatemia  Replete per pharmacy protocol  Normocytic anemia Secondary to critical illness-some minimal amount of blood seen on the ostomy-but doubt any active or chronic bleeding (chronic blood loss ruled out) Follow CBC and transfuse accordingly.  Persistent atrial fibrillation Telemetry monitoring Tikosyn orally-okay to clamp NG tube for a few minutes Cautiously continue with IV heparin-see above. Attempt to keep K> 4  History of AV nodal ablation-s/p PPM ([employer]) Telemetry monitoring  Chronic HFrEF (EF 35-40% on 4/9 by TTE) Volume status stable  IV Lasix as needed based on daily exam-does not require Lasix  12/4. Resume oral diuretics when 6 more stable.   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  GERD PPI  DM-2 (A1c 6.2 on 11/15) CBGs stable on SSI  Recent Labs    05/11/23 2348 05/12/23 0413 05/12/23 0712  GLUCAP 144* 141* 133*   Nutrition Status: Nutrition Problem: Moderate Malnutrition Etiology: chronic illness Signs/Symptoms: mild fat depletion, mild muscle depletion, energy intake < 75% for > 7 days Interventions: Ensure Enlive (each supplement provides 350kcal and 20 grams of protein), Magic cup, MVI  Obesity: Estimated body mass index is 35.87 kg/m as calculated from the following:   Height as of this encounter: 5' (1.524 m).   Weight as of this encounter: 83.3 kg.   Code status:   Code Status: Limited: Do not attempt resuscitation (DNR) -DNR-LIMITED -Do Not Intubate/DNI    DVT Prophylaxis: IV heparin   Family Communication: None at bedside.   Disposition Plan: Status is: Inpatient Remains inpatient appropriate because: Severity of illness   Planned Discharge Destination:Skilled nursing facility   Diet: Diet Order             Diet NPO time specified Except for: Ice Chips  Diet effective now                     Antimicrobial agents: Anti-infectives (From admission, onward)    Start     Dose/Rate Route Frequency Ordered Stop   05/10/23 1000  piperacillin-tazobactam (ZOSYN) IVPB 3.375 g        3.375 g 12.5 mL/hr over 240 Minutes Intravenous Every 8 hours 05/10/23 0914     05/03/23 1400  piperacillin-tazobactam (ZOSYN) IVPB 3.375 g        3.375 g 12.5 mL/hr over 240 Minutes Intravenous Every 8 hours 05/03/23 0935 05/07/23 0124   04/27/23 1021  sodium chloride 0.9 % with cefoTEtan (CEFOTAN) ADS Med       Note to Pharmacy: Shanda Bumps M: cabinet  override      04/27/23 1021 04/27/23 1253   04/26/23 0800  cefoTEtan (CEFOTAN) 2 g in sodium chloride 0.9 % 100 mL IVPB  Status:  Discontinued        2 g 200 mL/hr over 30 Minutes Intravenous To [facility] 04/23/23 2111 04/27/23 0800   04/22/23 2300  piperacillin-tazobactam (ZOSYN) IVPB 3.375 g        3.375 g 12.5 mL/hr over 240 Minutes Intravenous Every 8 hours 04/22/23 2256 05/02/23 2359   04/22/23 1900  piperacillin-tazobactam (ZOSYN) IVPB 3.375 g        3.375 g 100 mL/hr over 30 Minutes Intravenous  Once 04/22/23 1852 04/22/23 1954        MEDICATIONS: Scheduled Meds:  acetaminophen  650 mg Oral Q6H   Chlorhexidine Gluconate Cloth  6 each Topical Daily   dofetilide  250 mcg Oral Q12H   fluticasone furoate-vilanterol  1 puff Inhalation Daily   gabapentin  200 mg Oral QHS   insulin aspart  0-9 Units Subcutaneous Q4H   lidocaine  1 patch Transdermal Q24H   magic mouthwash  5 mL Oral QID   methocarbamol (ROBAXIN) injection  500 mg Intravenous Q6H   metoCLOPramide (REGLAN) injection  10 mg Intravenous Q6H   mouth rinse  15 mL Mouth Rinse 4 times per day   pantoprazole (PROTONIX) IV  40 mg Intravenous Q12H   revefenacin  175 mcg Nebulization Daily   Continuous Infusions:  heparin Stopped (05/10/23 1206)   piperacillin-tazobactam (ZOSYN)  IV 3.375 g (05/12/23 0921)   potassium chloride 10 mEq (05/12/23 0922)   promethazine (PHENERGAN) injection (IM or IVPB) Stopped (05/11/23 1422)   sodium bicarbonate 150 mEq in dextrose 5 % 1,150 mL infusion 50 mL/hr at 05/11/23 1851   TPN (CLINIMIX-E) Adult 42 mL/hr at 05/11/23 1851   PRN Meds:.albuterol, guaiFENesin-dextromethorphan, HYDROmorphone (DILAUDID) injection, ondansetron (ZOFRAN) IV, promethazine (PHENERGAN) injection (IM or IVPB), sodium chloride flush   I have personally reviewed following labs and imaging studies  LABORATORY DATA: CBC: Recent Labs  Lab 05/08/23 0334 05/09/23 0800 05/10/23 0226 05/11/23 0429  05/12/23 0342  WBC 19.1* 15.8* 15.1* 12.3* 8.9  HGB 7.9* 7.7* 9.0* 9.2* 8.3*  HCT 27.1* 25.8* 28.8* 28.8* 26.5*  MCV 85.8 84.3 84.7 83.0 84.1  PLT 356 318 309 292 251    Basic Metabolic Panel: Recent Labs  Lab 05/06/23 0346 05/07/23 0413 05/08/23 0334 05/09/23 0245 05/10/23 0226 05/11/23 0429 05/12/23 0342  NA 130*   < > 134* 132* 135 137 135  K 3.9   < > 4.1 4.1 4.1 3.6 3.4*  CL 103   < > 111 108 111 110 107  CO2 20*   < > 17* 17* 17* 18* 22  GLUCOSE 173*   < > 170* 150* 153* 174* 150*  BUN 19   < > 22 22 30* 22 15  CREATININE 0.68   < > 0.47 0.61 0.46 0.54 0.47  CALCIUM 8.2*   < > 8.4* 8.6* 8.7* 8.5* 7.9*  MG 2.2  --  2.1  --  2.3  --   --   PHOS 3.1  --  2.7  --  3.4  --   --    < > = values in this interval not displayed.    GFR: Estimated Creatinine Clearance: 57.2 mL/min (by C-G formula based on SCr of 0.47 mg/dL).  Liver Function Tests: Recent Labs  Lab 05/06/23 0346  AST 12*  ALT 11  ALKPHOS 48  BILITOT 0.4  PROT 5.4*  ALBUMIN 2.2*   No results for input(s): "LIPASE", "AMYLASE" in the last 168 hours. No results for input(s): "AMMONIA" in the last 168 hours.  Coagulation Profile: No results for input(s): "INR", "PROTIME" in the last 168 hours.  Cardiac Enzymes: No results for input(s): "CKTOTAL", "CKMB", "CKMBINDEX", "TROPONINI" in the last 168 hours.  BNP (last 3 results) No results for input(s): "PROBNP" in the last 8760 hours.  Lipid Profile: Recent Labs    05/10/23 0226  TRIG 154*     Thyroid Function Tests: No results for input(s): "TSH", "T4TOTAL", "FREET4", "T3FREE", "THYROIDAB" in the last 72 hours.  Anemia Panel: No results for input(s): "VITAMINB12", "FOLATE", "FERRITIN", "TIBC", "IRON", "RETICCTPCT" in the last 72 hours.  Urine analysis:    Component Value Date/Time   COLORURINE AMBER (A) 04/23/2023 0623   APPEARANCEUR CLOUDY (A) 04/23/2023 0623   APPEARANCEUR Hazy 12/25/2012 1653   LABSPEC 1.024 04/23/2023 0623    LABSPEC 1.021 12/25/2012 1653   PHURINE 5.0 04/23/2023 0623   GLUCOSEU NEGATIVE 04/23/2023 0623   GLUCOSEU NEGATIVE 03/17/2023 1003   HGBUR MODERATE (A) 04/23/2023 0623   BILIRUBINUR NEGATIVE 04/23/2023 0623   BILIRUBINUR Negative 12/25/2012 1653   KETONESUR NEGATIVE 04/23/2023 0623   PROTEINUR 30 (A) 04/23/2023 0623   UROBILINOGEN 0.2 03/17/2023 1003   NITRITE NEGATIVE 04/23/2023 6440  LEUKOCYTESUR LARGE (A) 04/23/2023 0623   LEUKOCYTESUR Negative 12/25/2012 1653    Sepsis Labs: Lactic Acid, Venous    Component Value Date/Time   LATICACIDVEN 0.8 04/23/2023 0114    MICROBIOLOGY: No results found for this or any previous visit (from the past 240 hour(s)).   RADIOLOGY STUDIES/RESULTS: No results found.   LOS: 20 days   Jeoffrey Massed, MD  [facility]    To contact the attending provider between 7A-7P or the covering provider during after hours 7P-7A, please log into the web site [email or web address] and access using universal [place] password for that web site. If you do not have the password, please call the hospital operator.  05/12/2023, 10:20 AM

## 2023-05-12 NOTE — Progress Notes (Signed)
Patient ID: Kathleen [name], female   DOB: 27-Sep-1945, 77 y.o.   MRN: [id number] [facility] Surgery Progress Note  15 Days Post-Op  Subjective: CC-  Comfortable this morning. Only 300cc out from NG over night. Ostomy productive. WBC normalized 8.9, afebrile.  Objective: Vital signs in last 24 hours: Temp:  [97.3 F (36.3 C)-97.7 F (36.5 C)] 97.7 F (36.5 C) (12/04 0518) Pulse Rate:  [71-76] 73 (12/04 0724) Resp:  [14-20] 14 (12/03 1956) BP: (108-124)/(47-63) 112/47 (12/04 0518) SpO2:  [99 %-100 %] 99 % (12/04 0724) Last BM Date : 05/10/23  Intake/Output from previous day: 12/03 0701 - 12/04 0700 In: 3367.3 [I.V.:2753.4; IV Piggyback:614] Out: 1610 [Emesis/NG output:800; Drains:10; Stool:800] Intake/Output this shift: No intake/output data recorded.  PE: Gen:  Alert, NAD Card:  RRR Pulm:  rate and effort normal on supplemental O2 via [place] Abd: soft, appropriately tender, open midline stable with few areas of breakdown but fascia appears intact/ otherwise mostly healthy pink granulation tissue, stoma pink with air and thin light brown stool in bag, JP #1 tan, JP #2 serosanguinous   Lab Results:  Recent Labs    05/11/23 0429 05/12/23 0342  WBC 12.3* 8.9  HGB 9.2* 8.3*  HCT 28.8* 26.5*  PLT 292 251   BMET Recent Labs    05/11/23 0429 05/12/23 0342  NA 137 135  K 3.6 3.4*  CL 110 107  CO2 18* 22  GLUCOSE 174* 150*  BUN 22 15  CREATININE 0.54 0.47  CALCIUM 8.5* 7.9*   PT/INR No results for input(s): "LABPROT", "INR" in the last 72 hours. CMP     Component Value Date/Time   NA 135 05/12/2023 0342   NA 130 (L) 12/28/2012 0748   K 3.4 (L) 05/12/2023 0342   K 4.6 12/28/2012 0748   CL 107 05/12/2023 0342   CL 95 (L) 12/28/2012 0748   CO2 22 05/12/2023 0342   CO2 23 12/28/2012 0748   GLUCOSE 150 (H) 05/12/2023 0342   GLUCOSE 391 (H) 12/28/2012 0748   BUN 15 05/12/2023 0342   BUN 32 (H) 12/28/2012 0748   CREATININE 0.47 05/12/2023 0342   CREATININE  0.86 01/27/2023 1137   CALCIUM 7.9 (L) 05/12/2023 0342   CALCIUM 14.3 (HH) 06/26/2019 1618   PROT 5.4 (L) 05/06/2023 0346   ALBUMIN 2.2 (L) 05/06/2023 0346   AST 12 (L) 05/06/2023 0346   ALT 11 05/06/2023 0346   ALKPHOS 48 05/06/2023 0346   BILITOT 0.4 05/06/2023 0346   GFRNONAA >60 05/12/2023 0342   GFRNONAA 43 (L) 12/28/2012 0748   GFRAA 58 (L) 06/28/2019 0427   GFRAA 50 (L) 12/28/2012 0748   Lipase     Component Value Date/Time   LIPASE 30 04/22/2023 1840       Studies/Results: No results found.  Anti-infectives: Anti-infectives (From admission, onward)    Start     Dose/Rate Route Frequency Ordered Stop   05/10/23 1000  piperacillin-tazobactam (ZOSYN) IVPB 3.375 g        3.375 g 12.5 mL/hr over 240 Minutes Intravenous Every 8 hours 05/10/23 0914     05/03/23 1400  piperacillin-tazobactam (ZOSYN) IVPB 3.375 g        3.375 g 12.5 mL/hr over 240 Minutes Intravenous Every 8 hours 05/03/23 0935 05/07/23 0124   04/27/23 1021  sodium chloride 0.9 % with cefoTEtan (CEFOTAN) ADS Med       Note to Pharmacy: Shanda Bumps M: cabinet override      04/27/23 1021 04/27/23 1253  04/26/23 0800  cefoTEtan (CEFOTAN) 2 g in sodium chloride 0.9 % 100 mL IVPB  Status:  Discontinued        2 g 200 mL/hr over 30 Minutes Intravenous To [facility]Stay Surgical 04/23/23 2111 04/27/23 0800   04/22/23 2300  piperacillin-tazobactam (ZOSYN) IVPB 3.375 g        3.375 g 12.5 mL/hr over 240 Minutes Intravenous Every 8 hours 04/22/23 2256 05/02/23 2359   04/22/23 1900  piperacillin-tazobactam (ZOSYN) IVPB 3.375 g        3.375 g 100 mL/hr over 30 Minutes Intravenous  Once 04/22/23 1852 04/22/23 1954        Assessment/Plan POD #15  elap, sigmoid colectomy, colostomy 11/19- MW - CT 12/1 ileus, small fluid collection near colonic stump 3.5x1.6cm. JP drain #1 tan/purulent, continue drains. Zosyn restarted 12/2 - Ostomy productive and NG output decreasing. Clamp NG this morning. Plan to recheck  later today and dc NG and start CLD if tolerating clamping trial - continue reglan - Continue PICC/TPN 11/22 -->  - WOC following for new ostomy. Stoma with some mucocutaneous separation, monitor. Will need ostomy clinic referral at discharge  - vac unable to hold seal and was removed, continue BID wet to dry dressing changes - Leukocytosis resolved, afebrile - oob, pulm toilet   ID zosyn 11/14>>11/29, 12/2>> VTE - heparin heparin gtt for afib  FEN - TPN, clamp NG, NPO    LOS: 20 days    Franne Forts, [facility] Surgery 05/12/2023, 9:49 AM Please see Amion for pager number during day hours 7:00am-4:30pm

## 2023-05-12 NOTE — Progress Notes (Addendum)
PHARMACY - ANTICOAGULATION CONSULT NOTE  Pharmacy Consult for Heparin gtt Indication: atrial fibrillation  Patient Measurements: Height: 5' (152.4 cm) Weight: 83.3 kg (183 lb 10.3 oz) IBW/kg (Calculated) : 45.5 Heparin Dosing Weight: 64 kg  Vital Signs: Temp: 97.7 F (36.5 C) (12/04 0518) Temp Source: Oral (12/04 0518) BP: 112/47 (12/04 0518)  Labs: Recent Labs    05/10/23 0226 05/11/23 0429 05/12/23 0342  HGB 9.0* 9.2* 8.3*  HCT 28.8* 28.8* 26.5*  PLT 309 292 251  HEPARINUNFRC 0.41 0.33 0.53  CREATININE 0.46 0.54 0.47   Estimated Creatinine Clearance: 57.2 mL/min (by C-G formula based on SCr of 0.47 mg/dL).  Assessment: [name] with history of atrial fibrillation on Xarelto prior to admission. Last dose of Xarelto 04/21/23 at 1900. Admitted for recurrent diverticulitis, perforation, and perirectal abscess. 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 Cleared by surgery to start hep gtt for afib.  Heparin remains therapeutic at 0.53. CBC okay -  hgb 8.3 and plts WNL. Continue current rate and monitor for bleeding. Of note, heparin charted as paused since 12/2 but was never stopped per nursing team.   Goal of Therapy:  Heparin level 0.3-0.7 units/ml Monitor platelets by anticoagulation protocol: Yes   Plan:  No bolus per surgery Continue heparin at 1900 units/hr Heparin level daily/CBC  Monitor for signs/symptoms of bleeding  [name] Smiling, PharmD PGY1 Pharmacy Resident 05/12/2023 7:17 AM

## 2023-05-12 NOTE — Progress Notes (Signed)
PHARMACY - TOTAL PARENTERAL NUTRITION CONSULT NOTE  Indication: Prolonged ileus  Patient Measurements: Height: 5' (152.4 cm) Weight: 83.3 kg (183 lb 10.3 oz) IBW/kg (Calculated) : 45.5 TPN AdjBW (KG): 54 Body mass index is 35.87 kg/m. 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 Patient has been NPO since surgery. Pharmacy consulted to manage TPN.  Glucose / Insulin: hx T2DM, A1c 6.2%; no meds PTA - CBGs < 180 Used 8 units mSSI/24 hours  Electrolytes: K down to 3.4 post 4 runs and CO2 normalized on bicarb gtt, others WNL Renal: SCr <1, BUN normalized Hepatic: LFTs / tbili WNL, TG slightly elevated at 154, albumin 2.2 Intake / Output; MIVF: UOP 0 ml/kg/hr, last Lasix 12/2. NG , JP drain 10mL, colostomy output (Reglan restarted 12/2) GI Imaging: 11/21 KUB moderately dilated loops of small bowel, c/w post-operative ileus 11/23 KUB: persistent SBO or ileus, no significant change from prior 11/25 KUB: bowel obstruction or ileus, pleural effusion vs atelectasis 11/25 CTAP: post-op ileus or distal SBO, trace L pleural effusion 11/26 CXR: L > R lower lobe atelectasis 11/29 Abd XR: distention of SB concerning for obstruction or worsening postop ileus 12/1 CT: mild ileus GI Surgeries / Procedures:  11/19 ex lap with sigmoid colectomy and end left colostomy  Central access: PICC placed 04/30/23 TPN start date: 04/30/23  Nutritional Goals: Clinimix E 8/10 1L at 42 ml/hr with SMOF lipids on MTWThFSa Clinimix E 8/10 2L at 82 ml/hr on Sun with SMOFlipids - Average of 1258 kCals (~80% of goal) and 91g AA per day (100% goal) Previously on D10 fluids for add'l kCal >> stopped on 11/26 given concerns for pleural  effusions/atelectasis noted on imaging, weights increased requiring Lasix IV - this has resolved so restarted D10 at 11/29, now changed to sodium bicarb D5 gtt   Electrolytes in Clinimix E 8/10 1L bag: Na 35 mEq, K 30 mEq, Mag 5 mEq, Ca 4.5 mEq, Phos , Acetate 83, Cl 76 mEq  RD Estimated Needs Total Energy Estimated Needs: 1600-1800 kcal Total Protein Estimated Needs: 75-95 gm Total Fluid Estimated Needs: >1.6L  Current Nutrition:  TPN   Plan:  Clinimix E 8/10 1L at 42 mL/hr and SMOFlipids at 20.8 ml/hr x 12 hrs on MTWThFSa Clinimix E 8/10 2L at 82 mL/hr and SMOFlipids at 20.8 ml/hr x 12 hrs on Sunday TPN providing an average of 1258 kCals (~80% of goal) and 91g AA per day (100% goal) Continue standard MVI and trace elements to TPN Reduce SSI to sensitive Q4H KCL x 6 runs D/C bicarb gtt per discussion with TRH - monitor CO2 closely Monitor TPN labs Mon and Thur and PRN  Clamping NG tube, possible removal and starting CLD per Surgery  Xitlali Kastens D. Laney Potash, PharmD, BCPS, BCCCP 05/12/2023, 10:29 AM

## 2023-05-12 NOTE — Progress Notes (Signed)
Physical Therapy Treatment Patient Details Name: Kathleen [name] MRN: [id number] DOB: [birth year] Today's Date: 05/12/2023   History of Present Illness 77 y.o. 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 S/p exploratory laparotomy with sigmoid colectomy and end left colostomy by Dr. Dwain Sarna on 04/27/2023.  PMHx: colovaginal fistula associated with diverticulitis, obesity, OSA , BOOP, tracheobronchomalacia, chronic respiratory failure on 4 to 5 L oxygen at baseline, persistent A-fib S/P pacemaker implant 2017, chronic systolic heart failure, recent admission from 04/09/2023 to 04/13/2023.    PT Comments  Pt received sitting in the recliner and agreeable to session. Pt able to stand and ambulate a short distance in the room with CGA for safety and assist with line management. Pt reports dizziness during gait trial requiring a seated rest break. Pt limited by fatigue and reporting weakness. Pt continues to benefit from PT services to progress toward functional mobility goals.     If plan is discharge home, recommend the following: A lot of help with walking and/or transfers;A lot of help with bathing/dressing/bathroom;Assistance with cooking/housework;Assist for transportation   Can travel by private vehicle     No  Equipment Recommendations  None recommended by PT    Recommendations for Other Services       Precautions / Restrictions Precautions Precautions: Fall Precaution Comments: multiple drains, NGT, wound vac     Mobility  Bed Mobility               General bed mobility comments: in recliner at beginning and end of session    Transfers Overall transfer level: Needs assistance Equipment used: Rollator (4 wheels) Transfers: Sit to/from Stand Sit to Stand: Contact guard assist           General transfer comment: increased time and CGA for safety    Ambulation/Gait Ambulation/Gait assistance: Contact guard assist Gait  Distance (Feet): 10 Feet (x2) Assistive device: Rollator (4 wheels) Gait Pattern/deviations: Step-through pattern, Trunk flexed, Decreased stride length       General Gait Details: slow steady gait with decreased B foot clearance      Balance Overall balance assessment: Needs assistance Sitting-balance support: No upper extremity supported, Feet supported Sitting balance-Leahy Scale: Good     Standing balance support: During functional activity, Bilateral upper extremity supported Standing balance-Leahy Scale: Fair Standing balance comment: with rollator support                            Cognition   Behavior During Therapy: WFL for tasks assessed/performed Overall Cognitive Status: Within Functional Limits for tasks assessed                                          Exercises      General Comments        Pertinent Vitals/Pain Pain Assessment Pain Assessment: Faces Faces Pain Scale: Hurts little more Pain Location: abdomen Pain Descriptors / Indicators: Grimacing, Discomfort Pain Intervention(s): Limited activity within patient's tolerance, Monitored during session, Repositioned     PT Goals (current goals can now be found in the care plan section) Acute Rehab PT Goals Patient Stated Goal: Reduce pain PT Goal Formulation: With patient Time For Goal Achievement: 05/12/23 Progress towards PT goals: Progressing toward goals    Frequency    Min 1X/week       AM-PAC PT "6 Clicks"  Mobility   Outcome Measure  Help needed turning from your back to your side while in a flat bed without using bedrails?: None Help needed moving from lying on your back to sitting on the side of a flat bed without using bedrails?: None Help needed moving to and from a bed to a chair (including a wheelchair)?: A Little Help needed standing up from a chair using your arms (e.g., wheelchair or bedside chair)?: A Little Help needed to walk in hospital  room?: A Lot (>73ft) Help needed climbing 3-5 steps with a railing? : Total 6 Click Score: 17    End of Session Equipment Utilized During Treatment: Oxygen;Gait belt Activity Tolerance: Patient limited by pain;Patient limited by fatigue Patient left: in chair;with call bell/phone within reach;with family/visitor present Nurse Communication: Mobility status PT Visit Diagnosis: Unsteadiness on feet (R26.81);Muscle weakness (generalized) (M62.81);Difficulty in walking, not elsewhere classified (R26.2);Pain     Time: 9604-5409 PT Time Calculation (min) (ACUTE ONLY): 35 min  Charges:    $Gait Training: 8-22 mins $Therapeutic Activity: 8-22 mins PT General Charges $$ ACUTE PT VISIT: 1 Visit                     Johny Shock, PTA Acute Rehabilitation Services Secure Chat Preferred  [phone]    Johny Shock 05/12/2023, 10:23 AM

## 2023-05-13 DIAGNOSIS — K5720 Diverticulitis of large intestine with perforation and abscess without bleeding: Secondary | ICD-10-CM | POA: Diagnosis not present

## 2023-05-13 DIAGNOSIS — E1140 Type 2 diabetes mellitus with diabetic neuropathy, unspecified: Secondary | ICD-10-CM | POA: Diagnosis not present

## 2023-05-13 DIAGNOSIS — E039 Hypothyroidism, unspecified: Secondary | ICD-10-CM | POA: Diagnosis not present

## 2023-05-13 DIAGNOSIS — E440 Moderate protein-calorie malnutrition: Secondary | ICD-10-CM | POA: Diagnosis not present

## 2023-05-13 LAB — COMPREHENSIVE METABOLIC PANEL
ALT: 13 U/L (ref 0–44)
AST: 12 U/L — ABNORMAL LOW (ref 15–41)
Albumin: 2.2 g/dL — ABNORMAL LOW (ref 3.5–5.0)
Alkaline Phosphatase: 66 U/L (ref 38–126)
Anion gap: 8 (ref 5–15)
BUN: 13 mg/dL (ref 8–23)
CO2: 21 mmol/L — ABNORMAL LOW (ref 22–32)
Calcium: 8.1 mg/dL — ABNORMAL LOW (ref 8.9–10.3)
Chloride: 102 mmol/L (ref 98–111)
Creatinine, Ser: 0.46 mg/dL (ref 0.44–1.00)
GFR, Estimated: >60 mL/min (ref >60)
Glucose, Bld: 150 mg/dL — ABNORMAL HIGH (ref 70–99)
Potassium: 3.6 mmol/L (ref 3.5–5.1)
Sodium: 131 mmol/L — ABNORMAL LOW (ref 135–145)
Total Bilirubin: 0.5 mg/dL (ref <1.2)
Total Protein: 5 g/dL — ABNORMAL LOW (ref 6.5–8.1)

## 2023-05-13 LAB — GLUCOSE, CAPILLARY
Glucose-Capillary: 126 mg/dL — ABNORMAL HIGH (ref 70–99)
Glucose-Capillary: 136 mg/dL — ABNORMAL HIGH (ref 70–99)
Glucose-Capillary: 139 mg/dL — ABNORMAL HIGH (ref 70–99)
Glucose-Capillary: 146 mg/dL — ABNORMAL HIGH (ref 70–99)
Glucose-Capillary: 151 mg/dL — ABNORMAL HIGH (ref 70–99)
Glucose-Capillary: 154 mg/dL — ABNORMAL HIGH (ref 70–99)
Glucose-Capillary: 187 mg/dL — ABNORMAL HIGH (ref 70–99)

## 2023-05-13 LAB — CBC
HCT: 26.4 % — ABNORMAL LOW (ref 36.0–46.0)
Hemoglobin: 7.9 g/dL — ABNORMAL LOW (ref 12.0–15.0)
MCH: 25.6 pg — ABNORMAL LOW (ref 26.0–34.0)
MCHC: 29.9 g/dL — ABNORMAL LOW (ref 30.0–36.0)
MCV: 85.7 fL (ref 80.0–100.0)
Platelets: 233 10*3/uL (ref 150–400)
RBC: 3.08 MIL/uL — ABNORMAL LOW (ref 3.87–5.11)
RDW: 17.8 % — ABNORMAL HIGH (ref 11.5–15.5)
WBC: 8.1 10*3/uL (ref 4.0–10.5)
nRBC: 0.5 % — ABNORMAL HIGH (ref 0.0–0.2)

## 2023-05-13 LAB — MAGNESIUM: Magnesium: 1.9 mg/dL (ref 1.7–2.4)

## 2023-05-13 LAB — HEPARIN LEVEL (UNFRACTIONATED): Heparin Unfractionated: 0.48 [IU]/mL (ref 0.30–0.70)

## 2023-05-13 LAB — BRAIN NATRIURETIC PEPTIDE: B Natriuretic Peptide: 141.2 pg/mL — ABNORMAL HIGH (ref 0.0–100.0)

## 2023-05-13 LAB — PHOSPHORUS: Phosphorus: 2.6 mg/dL (ref 2.5–4.6)

## 2023-05-13 MED ORDER — METHOCARBAMOL 500 MG PO TABS
500.0000 mg | ORAL_TABLET | Freq: Four times a day (QID) | ORAL | Status: DC
  Administered 2023-05-13 – 2023-05-20 (×26): 500 mg via ORAL
  Filled 2023-05-13 (×26): qty 1

## 2023-05-13 MED ORDER — MAGNESIUM SULFATE 2 GM/50ML IV SOLN
2.0000 g | Freq: Once | INTRAVENOUS | Status: AC
Start: 2023-05-13 — End: 2023-05-13
  Administered 2023-05-13: 2 g via INTRAVENOUS
  Filled 2023-05-13: qty 50

## 2023-05-13 MED ORDER — TRACE MINERALS CU-MN-SE-ZN 300-55-60-3000 MCG/ML IV SOLN
INTRAVENOUS | Status: AC
  Filled 2023-05-13 (×2): qty 1000

## 2023-05-13 MED ORDER — POTASSIUM CHLORIDE 10 MEQ/50ML IV SOLN
10.0000 meq | INTRAVENOUS | Status: AC
  Administered 2023-05-13 (×5): 10 meq via INTRAVENOUS
  Filled 2023-05-13 (×5): qty 50

## 2023-05-13 MED ORDER — FAT EMUL FISH OIL/PLANT BASED 20% (SMOFLIPID)IV EMUL
250.0000 mL | INTRAVENOUS | Status: AC
  Administered 2023-05-13: 250 mL via INTRAVENOUS
  Filled 2023-05-13: qty 250

## 2023-05-13 MED ORDER — TORSEMIDE 20 MG PO TABS
40.0000 mg | ORAL_TABLET | Freq: Every day | ORAL | Status: DC
  Administered 2023-05-13 – 2023-05-23 (×11): 40 mg via ORAL
  Filled 2023-05-13 (×11): qty 2

## 2023-05-13 MED ORDER — ENSURE ENLIVE PO LIQD
237.0000 mL | Freq: Three times a day (TID) | ORAL | Status: DC
  Administered 2023-05-13 – 2023-05-15 (×6): 237 mL via ORAL

## 2023-05-13 MED ORDER — HYDROMORPHONE HCL 1 MG/ML IJ SOLN
0.5000 mg | INTRAMUSCULAR | Status: DC | PRN
  Administered 2023-05-13 – 2023-05-14 (×5): 0.5 mg via INTRAVENOUS
  Filled 2023-05-13 (×5): qty 0.5

## 2023-05-13 MED ORDER — SPIRONOLACTONE 25 MG PO TABS
25.0000 mg | ORAL_TABLET | Freq: Every day | ORAL | Status: DC
  Administered 2023-05-13 – 2023-05-23 (×11): 25 mg via ORAL
  Filled 2023-05-13 (×11): qty 1

## 2023-05-13 MED ORDER — OXYCODONE HCL 5 MG PO TABS
5.0000 mg | ORAL_TABLET | ORAL | Status: DC | PRN
  Administered 2023-05-13 – 2023-05-14 (×5): 10 mg via ORAL
  Filled 2023-05-13 (×5): qty 2

## 2023-05-13 NOTE — Progress Notes (Signed)
Mobility Specialist Progress Note;   05/13/23 0910  Mobility  Activity Transferred to/from [facility];Transferred from bed to chair  Level of Assistance Contact guard assist, steadying assist  Assistive Device Other (Comment) (HHA)  Distance Ambulated (ft) 5 ft  Activity Response Tolerated well  Mobility Referral Yes  $Mobility charge 1 Mobility  Mobility Specialist Start Time (ACUTE ONLY) 0910  Mobility Specialist Stop Time (ACUTE ONLY) 0925  Mobility Specialist Time Calculation (min) (ACUTE ONLY) 15 min   Pt agreeable to mobility. Requesting assistance to [facility] and then to chair. Required MinG assistance via HHA for support to [facility]. Void successful. Transferred pt to chair. VSS throughout and no c/o. Pt left in chair with all needs met.   Caesar Bookman Mobility Specialist Please contact via SecureChat or Rehab Office [phone]

## 2023-05-13 NOTE — TOC Progression Note (Signed)
Transition of Care [facility]) - Progression Note    Patient Details  Name: Kathleen [name] MRN: [id number] Date of Birth: [birth year]  [facility] of [facility]) CM/SW Contact  Mearl Latin, LCSW Phone Number: 05/13/2023, 11:08 AM  Clinical Narrative:    TOC continuing to follow.    Expected Discharge Plan: Skilled Nursing Facility Barriers to Discharge: SNF Pending bed offer, SNF Pending discharge orders, SNF Pending transportation, SNF Pending discharge summary, Continued Medical Work up  Expected Discharge Plan and Services In-house Referral: Clinical Social Work   Post Acute Care Choice: Skilled Nursing Facility Living arrangements for the past 2 months: Independent Living Facility ([facility])                                       Social Determinants of Health (SDOH) Interventions SDOH Screenings   Food Insecurity: No Food Insecurity (04/22/2023)  Housing: Low Risk  (04/22/2023)  Transportation Needs: No Transportation Needs (04/22/2023)  Utilities: Not At Risk (04/22/2023)  Alcohol Screen: Low Risk  (05/21/2022)  Depression (PHQ2-9): Low Risk  (05/21/2022)  Financial Resource Strain: Low Risk  (05/21/2022)  Physical Activity: Insufficiently Active (05/21/2022)  Social Connections: Moderately Isolated (05/21/2022)  Stress: No Stress Concern Present (05/21/2022)  Tobacco Use: Medium Risk (04/27/2023)    Readmission Risk Interventions    04/26/2023    9:51 AM 01/14/2023   11:16 AM 05/04/2022    3:37 PM  Readmission Risk Prevention Plan  Transportation Screening Complete Complete Complete  PCP or Specialist Appt within 5-7 Days  Complete Complete  PCP or Specialist Appt within 3-5 Days Complete    Home Care Screening  Complete Complete  Medication Review (RN CM)  Complete Complete  HRI or Home Care Consult Complete    Palliative Care Screening --    Medication Review (RN Care Manager) Complete

## 2023-05-13 NOTE — Plan of Care (Signed)
  Problem: Fluid Volume: Goal: Ability to maintain a balanced intake and output will improve Outcome: Progressing   Problem: Metabolic: Goal: Ability to maintain appropriate glucose levels will improve Outcome: Progressing   Problem: Health Behavior/Discharge Planning: Goal: Ability to manage health-related needs will improve Outcome: Progressing   Problem: Clinical Measurements: Goal: Cardiovascular complication will be avoided Outcome: Progressing   Problem: Coping: Goal: Level of anxiety will decrease Outcome: Progressing

## 2023-05-13 NOTE — TOC Progression Note (Signed)
Transition of Care [facility]) - Progression Note    Patient Details  Name: Kathleen [name] MRN: [id number] Date of Birth: Dec 05, 1945  Transition of Care [facility]) CM/SW Contact  Mearl Latin, LCSW Phone Number: 05/13/2023, 11:09 AM  Clinical Narrative:    TOC continuing to follow. Per MD, not stable for LTACH.    Expected Discharge Plan: Skilled Nursing Facility Barriers to Discharge: SNF Pending bed offer, SNF Pending discharge orders, SNF Pending transportation, SNF Pending discharge summary, Continued Medical Work up  Expected Discharge Plan and Services In-house Referral: Clinical Social Work   Post Acute Care Choice: Skilled Nursing Facility Living arrangements for the past 2 months: Independent Living Facility ([facility])                                       Social Determinants of Health (SDOH) Interventions SDOH Screenings   Food Insecurity: No Food Insecurity (04/22/2023)  Housing: Low Risk  (04/22/2023)  Transportation Needs: No Transportation Needs (04/22/2023)  Utilities: Not At Risk (04/22/2023)  Alcohol Screen: Low Risk  (05/21/2022)  Depression (PHQ2-9): Low Risk  (05/21/2022)  Financial Resource Strain: Low Risk  (05/21/2022)  Physical Activity: Insufficiently Active (05/21/2022)  Social Connections: Moderately Isolated (05/21/2022)  Stress: No Stress Concern Present (05/21/2022)  Tobacco Use: Medium Risk (04/27/2023)    Readmission Risk Interventions    04/26/2023    9:51 AM 01/14/2023   11:16 AM 05/04/2022    3:37 PM  Readmission Risk Prevention Plan  Transportation Screening Complete Complete Complete  PCP or Specialist Appt within 5-7 Days  Complete Complete  PCP or Specialist Appt within 3-5 Days Complete    Home Care Screening  Complete Complete  Medication Review (RN CM)  Complete Complete  HRI or Home Care Consult Complete    Palliative Care Screening --    Medication Review (RN Care Manager) Complete

## 2023-05-13 NOTE — Progress Notes (Signed)
PHARMACY - ANTICOAGULATION CONSULT NOTE  Pharmacy Consult for Heparin gtt Indication: atrial fibrillation  Patient Measurements: Height: 5' (152.4 cm) Weight: 83.3 kg (183 lb 10.3 oz) IBW/kg (Calculated) : 45.5 Heparin Dosing Weight: 64 kg  Vital Signs: Temp: 97.8 F (36.6 C) (12/05 0000) Temp Source: Oral (12/04 2000) BP: 121/54 (12/05 0419) Pulse Rate: 73 (12/05 0419)  Labs: Recent Labs    05/11/23 0429 05/12/23 0342 05/13/23 0407  HGB 9.2* 8.3* 7.9*  HCT 28.8* 26.5* 26.4*  PLT 292 251 233  HEPARINUNFRC 0.33 0.53 0.48  CREATININE 0.54 0.47 0.46   Estimated Creatinine Clearance: 57.2 mL/min (by C-G formula based on SCr of 0.46 mg/dL).  Assessment: [name] with history of atrial fibrillation on Xarelto prior to admission. Last dose of Xarelto 04/21/23 at 1900. Admitted for recurrent diverticulitis, perforation, and perirectal abscess. 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 Cleared by surgery to start hep gtt for afib.  Heparin remains therapeutic at 0.48 on 1900 units/hr. Hgb slightly down to 7.9, however no new bleeding noted per RN. PLT count stable. No issues with infusion per RN.   Goal of Therapy:  Heparin level 0.3-0.7 units/ml Monitor platelets by anticoagulation protocol: Yes   Plan:  No bolus per surgery Continue heparin at 1900 units/hr Heparin level daily/CBC  Monitor for signs/symptoms of bleeding  Lennie Muckle, PharmD PGY1 Pharmacy Resident 05/13/2023 7:30 AM

## 2023-05-13 NOTE — TOC Progression Note (Signed)
Transition of Care [facility]) - Progression Note    Patient Details  Name: Kathleen [name] MRN: [id number] Date of Birth: [birth year]  Transition of Care [facility]) CM/SW Contact  Mearl Latin, LCSW Phone Number: 05/13/2023, 11:10 AM  Clinical Narrative:    Per MD, patient no longer in need of LTACH at discharge. CSW will send out SNF referrals once patient is off of TPN.    Expected Discharge Plan: Skilled Nursing Facility Barriers to Discharge: SNF Pending bed offer, SNF Pending discharge orders, SNF Pending transportation, SNF Pending discharge summary, Continued Medical Work up  Expected Discharge Plan and Services In-house Referral: Clinical Social Work   Post Acute Care Choice: Skilled Nursing Facility Living arrangements for the past 2 months: Independent Living Facility ([facility])                                       Social Determinants of Health (SDOH) Interventions SDOH Screenings   Food Insecurity: No Food Insecurity (04/22/2023)  Housing: Low Risk  (04/22/2023)  Transportation Needs: No Transportation Needs (04/22/2023)  Utilities: Not At Risk (04/22/2023)  Alcohol Screen: Low Risk  (05/21/2022)  Depression (PHQ2-9): Low Risk  (05/21/2022)  Financial Resource Strain: Low Risk  (05/21/2022)  Physical Activity: Insufficiently Active (05/21/2022)  Social Connections: Moderately Isolated (05/21/2022)  Stress: No Stress Concern Present (05/21/2022)  Tobacco Use: Medium Risk (04/27/2023)    Readmission Risk Interventions    04/26/2023    9:51 AM 01/14/2023   11:16 AM 05/04/2022    3:37 PM  Readmission Risk Prevention Plan  Transportation Screening Complete Complete Complete  PCP or Specialist Appt within 5-7 Days  Complete Complete  PCP or Specialist Appt within 3-5 Days Complete    Home Care Screening  Complete Complete  Medication Review (RN CM)  Complete Complete  HRI or Home Care Consult Complete    Palliative Care Screening --    Medication  Review (RN Care Manager) Complete

## 2023-05-13 NOTE — Plan of Care (Signed)
  Problem: Education: Goal: Ability to describe self-care measures that may prevent or decrease complications (Diabetes Survival Skills Education) will improve Outcome: Progressing   Problem: Coping: Goal: Ability to adjust to condition or change in health will improve Outcome: Progressing   Problem: Fluid Volume: Goal: Ability to maintain a balanced intake and output will improve Outcome: Progressing   Problem: Health Behavior/Discharge Planning: Goal: Ability to manage health-related needs will improve Outcome: Progressing   Problem: Metabolic: Goal: Ability to maintain appropriate glucose levels will improve Outcome: Progressing   Problem: Nutritional: Goal: Maintenance of adequate nutrition will improve Outcome: Progressing Goal: Progress toward achieving an optimal weight will improve Outcome: Progressing   Problem: Skin Integrity: Goal: Risk for impaired skin integrity will decrease Outcome: Progressing   Problem: Tissue Perfusion: Goal: Adequacy of tissue perfusion will improve Outcome: Progressing   Problem: Education: Goal: Knowledge of General Education information will improve Description: Including pain rating scale, medication(s)/side effects and non-pharmacologic comfort measures Outcome: Progressing   Problem: Health Behavior/Discharge Planning: Goal: Ability to manage health-related needs will improve Outcome: Progressing   Problem: Clinical Measurements: Goal: Will remain free from infection Outcome: Progressing Goal: Diagnostic test results will improve Outcome: Progressing Goal: Cardiovascular complication will be avoided Outcome: Progressing   Problem: Activity: Goal: Risk for activity intolerance will decrease Outcome: Progressing   Problem: Nutrition: Goal: Adequate nutrition will be maintained Outcome: Progressing   Problem: Coping: Goal: Level of anxiety will decrease Outcome: Progressing

## 2023-05-13 NOTE — Progress Notes (Signed)
PROGRESS NOTE        PATIENT DETAILS Name: Kathleen [name] Age: 77 y.o. Sex: female Date of Birth: Oct 02, 1945 Admit Date: 04/22/2023 Admitting Physician Gery Pray, MD [id number], Bernadene Bell, MD  Brief Summary:  Patient is a 77 y.o.  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  Admitted by [facility]-started on empiric IV antibiotics-underwent preop assessment by cardiology-and then subsequently underwent  exploratory laparotomy with sigmoid colectomy and end ostomy on 11/19.  Postoperative course complicated by ileus-requiring NG tube decompression-and TNA for nutrition.  See below for further details.  Significant events: 11/14>> admit to [facility] 11/19>>exploratory laparotomy with sigmoid colectomy and end ostomy  12/04>> NG removed-clear liquid started  Significant studies: 11/14>> CT abdomen/pelvis: Worsening signal right diverticulitis-localized perforation-5.6 cm perirectal abscess, 6.0 cm left lower quadrant abscess. 11/22>> x-ray abdomen: Consistent with postoperative ileus. 11/25>> CT abdomen/pelvis: No abscess 12/01>> CT abdomen/pelvis: Mild small bowel ileus-no obvious abscess-small fluid collection in the colonic stump  Significant microbiology data: 11/14>> blood culture: No growth 11/14>> COVID/influenza/RSV PCR: Negative  Procedures: 11/19>>exploratory laparotomy with sigmoid colectomy  end ostomy on 11/19  Consults: Cardiology GI General surgery  Subjective: Tolerating clear liquids.  Brown stool in ostomy  Objective: Vitals: Blood pressure (!) 103/55, pulse 74, temperature 97.6 F (36.4 C), temperature source Oral, resp. rate 18, height 5' (1.524 m), weight 83.3 kg, SpO2 97%.   Exam: Gen Exam:Alert awake-not in any  distress HEENT:atraumatic, normocephalic Chest: B/L clear to auscultation anteriorly CVS:S1S2 regular Abdomen:soft non tender, non distended Extremities:trace edema Neurology: Non focal Skin: no rash   Pertinent Labs/Radiology:    Latest Ref Rng & Units 05/13/2023    4:07 AM 05/12/2023    3:42 AM 05/11/2023    4:29 AM  CBC  WBC 4.0 - 10.5 K/uL 8.1  8.9  12.3   Hemoglobin 12.0 - 15.0 g/dL 7.9  8.3  9.2   Hematocrit 36.0 - 46.0 % 26.4  26.5  28.8   Platelets 150 - 400 K/uL 233  251  292     Lab Results  Component Value Date   NA 131 (L) 05/13/2023   K 3.6 05/13/2023   CL 102 05/13/2023   CO2 21 (L) 05/13/2023     Assessment/Plan:  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  General surgery recommending that we continue antibiotics for now. Remains on TNA for now.  Hypokalemia/hypophosphatemia  Being repleted per pharmacy protocol.  Normocytic anemia Secondary to critical illness-some minimal amount of blood seen on the ostomy-but doubt any active or chronic bleeding (chronic blood loss ruled out) Follow CBC and transfuse accordingly.  Persistent atrial fibrillation Telemetry monitoring Tikosyn orally IV heparin-probably could be switched to Eliquis in the next day or so. Attempt to keep K> 4  History of AV nodal ablation-s/p PPM ([employer]) Telemetry monitoring  Chronic HFrEF (EF 35-40% on 4/9 by TTE) Volume status reasonably stable but some leg edema today Will resume oral Lasix/Aldactone today.  Chronic hypoxic respiratory failure on home O2 COPD BOOP Stable Pulmonary  tolerating with incentive spirometry/flutter valve Mobilization with PT/OT Continue bronchodilators Incentive spirometry/flutter valve Out of bed to chair.  GERD PPI  DM-2 (A1c 6.2 on 11/15) CBGs stable on SSI  Recent Labs    05/13/23 0022 05/13/23 0422 05/13/23 0739  GLUCAP 151* 154* 139*   Nutrition Status: Nutrition Problem: Moderate Malnutrition Etiology: chronic illness Signs/Symptoms: mild fat depletion, mild muscle depletion, energy intake < 75% for > 7 days Interventions: Ensure Enlive (each supplement provides 350kcal and 20 grams of protein), Magic cup, MVI  Obesity: Estimated body mass index is 35.87 kg/m as calculated from the following:   Height as of this encounter: 5' (1.524 m).   Weight as of this encounter: 83.3 kg.   Code status:   Code Status: Limited: Do not attempt resuscitation (DNR) -DNR-LIMITED -Do Not Intubate/DNI    DVT Prophylaxis: IV heparin   Family Communication: None at bedside.   Disposition Plan: Status is: Inpatient Remains inpatient appropriate because: Severity of illness   Planned Discharge Destination:Skilled nursing facility   Diet: Diet Order             Diet full liquid Fluid consistency: Thin  Diet effective now                     Antimicrobial agents: Anti-infectives (From admission, onward)    Start     Dose/Rate Route Frequency Ordered Stop   05/10/23 1000  piperacillin-tazobactam (ZOSYN) IVPB 3.375 g        3.375 g 12.5 mL/hr over 240 Minutes Intravenous Every 8 hours 05/10/23 0914     05/03/23 1400  piperacillin-tazobactam (ZOSYN) IVPB 3.375 g        3.375 g 12.5 mL/hr over 240 Minutes Intravenous Every 8 hours 05/03/23 0935 05/07/23 0124   04/27/23 1021  sodium chloride 0.9 % with cefoTEtan (CEFOTAN) ADS Med       Note to Pharmacy: Shanda Bumps M: cabinet override      04/27/23 1021 04/27/23 1253   04/26/23 0800  cefoTEtan (CEFOTAN) 2 g in sodium chloride 0.9 % 100 mL IVPB  Status:   Discontinued        2 g 200 mL/hr over 30 Minutes Intravenous To ShortStay Surgical 04/23/23 2111 04/27/23 0800   04/22/23 2300  piperacillin-tazobactam (ZOSYN) IVPB 3.375 g        3.375 g 12.5 mL/hr over 240 Minutes Intravenous Every 8 hours 04/22/23 2256 05/02/23 2359   04/22/23 1900  piperacillin-tazobactam (ZOSYN) IVPB 3.375 g        3.375 g 100 mL/hr over 30 Minutes Intravenous  Once 04/22/23 1852 04/22/23 1954        MEDICATIONS: Scheduled Meds:  acetaminophen  650 mg Oral Q6H   Chlorhexidine Gluconate Cloth  6 each Topical Daily   dofetilide  250 mcg Oral Q12H   feeding supplement  237 mL Oral TID BM   fluticasone furoate-vilanterol  1 puff Inhalation Daily   gabapentin  200 mg Oral QHS   insulin aspart  0-9 Units Subcutaneous Q4H   lidocaine  1 patch Transdermal Q24H   magic mouthwash  5 mL Oral QID   methocarbamol  500 mg Oral Q6H   mouth rinse  15 mL Mouth Rinse 4 times per day   pantoprazole (PROTONIX) IV  40 mg Intravenous Q12H   revefenacin  175 mcg Nebulization Daily   Continuous Infusions:  TPN (CLINIMIX-E) Adult  And   fat emul(SMOFlipid)     heparin 1,900 Units/hr (05/13/23 0210)   piperacillin-tazobactam (ZOSYN)  IV 3.375 g (05/13/23 0847)   potassium chloride 10 mEq (05/13/23 0949)   promethazine (PHENERGAN) injection (IM or IVPB) Stopped (05/11/23 1422)   TPN (CLINIMIX-E) Adult 42 mL/hr at 05/12/23 1726   PRN Meds:.albuterol, guaiFENesin-dextromethorphan, HYDROmorphone (DILAUDID) injection, ondansetron (ZOFRAN) IV, oxyCODONE, promethazine (PHENERGAN) injection (IM or IVPB), sodium chloride flush   I have personally reviewed following labs and imaging studies  LABORATORY DATA: CBC: Recent Labs  Lab 05/09/23 0800 05/10/23 0226 05/11/23 0429 05/12/23 0342 05/13/23 0407  WBC 15.8* 15.1* 12.3* 8.9 8.1  HGB 7.7* 9.0* 9.2* 8.3* 7.9*  HCT 25.8* 28.8* 28.8* 26.5* 26.4*  MCV 84.3 84.7 83.0 84.1 85.7  PLT 318 309 292 251 233    Basic Metabolic  Panel: Recent Labs  Lab 05/08/23 0334 05/09/23 0245 05/10/23 0226 05/11/23 0429 05/12/23 0342 05/13/23 0407  NA 134* 132* 135 137 135 131*  K 4.1 4.1 4.1 3.6 3.4* 3.6  CL 111 108 111 110 107 102  CO2 17* 17* 17* 18* 22 21*  GLUCOSE 170* 150* 153* 174* 150* 150*  BUN 22 22 30* 22 15 13   CREATININE 0.47 0.61 0.46 0.54 0.47 0.46  CALCIUM 8.4* 8.6* 8.7* 8.5* 7.9* 8.1*  MG 2.1  --  2.3  --   --  1.9  PHOS 2.7  --  3.4  --   --  2.6    GFR: Estimated Creatinine Clearance: 57.2 mL/min (by C-G formula based on SCr of 0.46 mg/dL).  Liver Function Tests: Recent Labs  Lab 05/13/23 0407  AST 12*  ALT 13  ALKPHOS 66  BILITOT 0.5  PROT 5.0*  ALBUMIN 2.2*   No results for input(s): "LIPASE", "AMYLASE" in the last 168 hours. No results for input(s): "AMMONIA" in the last 168 hours.  Coagulation Profile: No results for input(s): "INR", "PROTIME" in the last 168 hours.  Cardiac Enzymes: No results for input(s): "CKTOTAL", "CKMB", "CKMBINDEX", "TROPONINI" in the last 168 hours.  BNP (last 3 results) No results for input(s): "PROBNP" in the last 8760 hours.  Lipid Profile: No results for input(s): "CHOL", "HDL", "LDLCALC", "TRIG", "CHOLHDL", "LDLDIRECT" in the last 72 hours.    Thyroid Function Tests: No results for input(s): "TSH", "T4TOTAL", "FREET4", "T3FREE", "THYROIDAB" in the last 72 hours.  Anemia Panel: No results for input(s): "VITAMINB12", "FOLATE", "FERRITIN", "TIBC", "IRON", "RETICCTPCT" in the last 72 hours.  Urine analysis:    Component Value Date/Time   COLORURINE AMBER (A) 04/23/2023 0623   APPEARANCEUR CLOUDY (A) 04/23/2023 0623   APPEARANCEUR Hazy 12/25/2012 1653   LABSPEC 1.024 04/23/2023 0623   LABSPEC 1.021 12/25/2012 1653   PHURINE 5.0 04/23/2023 0623   GLUCOSEU NEGATIVE 04/23/2023 0623   GLUCOSEU NEGATIVE 03/17/2023 1003   HGBUR MODERATE (A) 04/23/2023 0623   BILIRUBINUR NEGATIVE 04/23/2023 0623   BILIRUBINUR Negative 12/25/2012 1653    KETONESUR NEGATIVE 04/23/2023 0623   PROTEINUR 30 (A) 04/23/2023 0623   UROBILINOGEN 0.2 03/17/2023 1003   NITRITE NEGATIVE 04/23/2023 0623   LEUKOCYTESUR LARGE (A) 04/23/2023 0623   LEUKOCYTESUR Negative 12/25/2012 1653    Sepsis Labs: Lactic Acid, Venous    Component Value Date/Time   LATICACIDVEN 0.8 04/23/2023 0114    MICROBIOLOGY: No results found for this or any previous visit (from the past 240 hour(s)).   RADIOLOGY STUDIES/RESULTS: No results found.   LOS: 21 days   Jeoffrey Massed, MD  [facility]  To contact the attending provider between 7A-7P or the covering provider during after hours 7P-7A, please log into the web site [email or web address] and access using universal [place] password for that web site. If you do not have the password, please call the hospital operator.  05/13/2023, 10:02 AM

## 2023-05-13 NOTE — TOC Progression Note (Signed)
Transition of Care [facility]) - Progression Note    Patient Details  Name: Kathleen [name] MRN: [id number] Date of Birth: 03/05/1946  Transition of Care [facility]) CM/SW Contact  Mearl Latin, LCSW Phone Number: 05/13/2023, 11:09 AM  Clinical Narrative:    TOC continuing to follow.    Expected Discharge Plan: Skilled Nursing Facility Barriers to Discharge: SNF Pending bed offer, SNF Pending discharge orders, SNF Pending transportation, SNF Pending discharge summary, Continued Medical Work up  Expected Discharge Plan and Services In-house Referral: Clinical Social Work   Post Acute Care Choice: Skilled Nursing Facility Living arrangements for the past 2 months: Independent Living Facility ([facility])                                       Social Determinants of Health (SDOH) Interventions SDOH Screenings   Food Insecurity: No Food Insecurity (04/22/2023)  Housing: Low Risk  (04/22/2023)  Transportation Needs: No Transportation Needs (04/22/2023)  Utilities: Not At Risk (04/22/2023)  Alcohol Screen: Low Risk  (05/21/2022)  Depression (PHQ2-9): Low Risk  (05/21/2022)  Financial Resource Strain: Low Risk  (05/21/2022)  Physical Activity: Insufficiently Active (05/21/2022)  Social Connections: Moderately Isolated (05/21/2022)  Stress: No Stress Concern Present (05/21/2022)  Tobacco Use: Medium Risk (04/27/2023)    Readmission Risk Interventions    04/26/2023    9:51 AM 01/14/2023   11:16 AM 05/04/2022    3:37 PM  Readmission Risk Prevention Plan  Transportation Screening Complete Complete Complete  PCP or Specialist Appt within 5-7 Days  Complete Complete  PCP or Specialist Appt within 3-5 Days Complete    Home Care Screening  Complete Complete  Medication Review (RN CM)  Complete Complete  HRI or Home Care Consult Complete    Palliative Care Screening --    Medication Review (RN Care Manager) Complete

## 2023-05-13 NOTE — TOC Progression Note (Signed)
Transition of Care [facility]) - Progression Note    Patient Details  Name: Kathleen [name] MRN: [id number] Date of Birth: [birth year]  Transition of Care [facility]) CM/SW Contact  Mearl Latin, LCSW Phone Number: 05/13/2023, 11:07 AM  Clinical Narrative:    Patient transferred to 5W. Per prior [facility] team, [facility] has spoken with patient's family and they will discuss choice. TOC will continue to follow for needs. Per MD, not stable for LTACH at this time.    Expected Discharge Plan: Skilled Nursing Facility Barriers to Discharge: SNF Pending bed offer, SNF Pending discharge orders, SNF Pending transportation, SNF Pending discharge summary, Continued Medical Work up  Expected Discharge Plan and Services In-house Referral: Clinical Social Work   Post Acute Care Choice: Skilled Nursing Facility Living arrangements for the past 2 months: Independent Living Facility ([facility])                                       Social Determinants of Health (SDOH) Interventions SDOH Screenings   Food Insecurity: No Food Insecurity (04/22/2023)  Housing: Low Risk  (04/22/2023)  Transportation Needs: No Transportation Needs (04/22/2023)  Utilities: Not At Risk (04/22/2023)  Alcohol Screen: Low Risk  (05/21/2022)  Depression (PHQ2-9): Low Risk  (05/21/2022)  Financial Resource Strain: Low Risk  (05/21/2022)  Physical Activity: Insufficiently Active (05/21/2022)  Social Connections: Moderately Isolated (05/21/2022)  Stress: No Stress Concern Present (05/21/2022)  Tobacco Use: Medium Risk (04/27/2023)    Readmission Risk Interventions    04/26/2023    9:51 AM 01/14/2023   11:16 AM 05/04/2022    3:37 PM  Readmission Risk Prevention Plan  Transportation Screening Complete Complete Complete  PCP or Specialist Appt within 5-7 Days  Complete Complete  PCP or Specialist Appt within 3-5 Days Complete    Home Care Screening  Complete Complete  Medication Review (RN CM)  Complete Complete   HRI or Home Care Consult Complete    Palliative Care Screening --    Medication Review (RN Care Manager) Complete

## 2023-05-13 NOTE — Progress Notes (Addendum)
Patient ID: Kathleen [name], female   DOB: Apr 21, 1946, 77 y.o.   MRN: [id number] [facility] Surgery Progress Note  16 Days Post-Op  Subjective: CC-  In good spirits this morning. Tolerating clear liquids. Denies nausea or vomiting. Ostomy productive. Ambulated to the door yesterday.  Objective: Vital signs in last 24 hours: Temp:  [97.6 F (36.4 C)-98.2 F (36.8 C)] 97.6 F (36.4 C) (12/05 0740) Pulse Rate:  [71-74] 74 (12/05 0740) Resp:  [14-21] 18 (12/05 0740) BP: (103-121)/(54-64) 103/55 (12/05 0740) SpO2:  [97 %-100 %] 97 % (12/05 0419) Last BM Date : 05/12/23  Intake/Output from previous day: 12/04 0701 - 12/05 0700 In: 1957.6 [I.V.:1643.9; IV Piggyback:313.8] Out: 150 [Stool:150] Intake/Output this shift: No intake/output data recorded.  PE: Gen:  Alert, NAD Card:  RRR Pulm:  rate and effort normal on supplemental O2 via [place] Abd: soft, appropriately tender, open midline stable with few areas of breakdown but fascia appears intact/ otherwise mostly healthy pink granulation tissue (pictured below), stoma pink with mucocutaneous separation at 5 and 8 o'clock, air and scant thin light brown stool in bag, JP #1 serous/cloudy and thinner than yesterday, JP #2 serous/cloudy      Lab Results:  Recent Labs    05/12/23 0342 05/13/23 0407  WBC 8.9 8.1  HGB 8.3* 7.9*  HCT 26.5* 26.4*  PLT 251 233   BMET Recent Labs    05/12/23 0342 05/13/23 0407  NA 135 131*  K 3.4* 3.6  CL 107 102  CO2 22 21*  GLUCOSE 150* 150*  BUN 15 13  CREATININE 0.47 0.46  CALCIUM 7.9* 8.1*   PT/INR No results for input(s): "LABPROT", "INR" in the last 72 hours. CMP     Component Value Date/Time   NA 131 (L) 05/13/2023 0407   NA 130 (L) 12/28/2012 0748   K 3.6 05/13/2023 0407   K 4.6 12/28/2012 0748   CL 102 05/13/2023 0407   CL 95 (L) 12/28/2012 0748   CO2 21 (L) 05/13/2023 0407   CO2 23 12/28/2012 0748   GLUCOSE 150 (H) 05/13/2023 0407   GLUCOSE 391 (H) 12/28/2012 0748    BUN 13 05/13/2023 0407   BUN 32 (H) 12/28/2012 0748   CREATININE 0.46 05/13/2023 0407   CREATININE 0.86 01/27/2023 1137   CALCIUM 8.1 (L) 05/13/2023 0407   CALCIUM 14.3 (HH) 06/26/2019 1618   PROT 5.0 (L) 05/13/2023 0407   ALBUMIN 2.2 (L) 05/13/2023 0407   AST 12 (L) 05/13/2023 0407   ALT 13 05/13/2023 0407   ALKPHOS 66 05/13/2023 0407   BILITOT 0.5 05/13/2023 0407   GFRNONAA >60 05/13/2023 0407   GFRNONAA 43 (L) 12/28/2012 0748   GFRAA 58 (L) 06/28/2019 0427   GFRAA 50 (L) 12/28/2012 0748   Lipase     Component Value Date/Time   LIPASE 30 04/22/2023 1840       Studies/Results: No results found.  Anti-infectives: Anti-infectives (From admission, onward)    Start     Dose/Rate Route Frequency Ordered Stop   05/10/23 1000  piperacillin-tazobactam (ZOSYN) IVPB 3.375 g        3.375 g 12.5 mL/hr over 240 Minutes Intravenous Every 8 hours 05/10/23 0914     05/03/23 1400  piperacillin-tazobactam (ZOSYN) IVPB 3.375 g        3.375 g 12.5 mL/hr over 240 Minutes Intravenous Every 8 hours 05/03/23 0935 05/07/23 0124   04/27/23 1021  sodium chloride 0.9 % with cefoTEtan (CEFOTAN) ADS Med  Note to Pharmacy: Shanda Bumps M: cabinet override      04/27/23 1021 04/27/23 1253   04/26/23 0800  cefoTEtan (CEFOTAN) 2 g in sodium chloride 0.9 % 100 mL IVPB  Status:  Discontinued        2 g 200 mL/hr over 30 Minutes Intravenous To ShortStay Surgical 04/23/23 2111 04/27/23 0800   04/22/23 2300  piperacillin-tazobactam (ZOSYN) IVPB 3.375 g        3.375 g 12.5 mL/hr over 240 Minutes Intravenous Every 8 hours 04/22/23 2256 05/02/23 2359   04/22/23 1900  piperacillin-tazobactam (ZOSYN) IVPB 3.375 g        3.375 g 100 mL/hr over 30 Minutes Intravenous  Once 04/22/23 1852 04/22/23 1954        Assessment/Plan POD #16  elap, sigmoid colectomy, colostomy 11/19- MW - CT 12/1 ileus, small fluid collection near colonic stump 3.5x1.6cm.  Zosyn restarted 12/2. WBC WNL, afebrile. JP#1  is thinner today (20cc) - will discuss with MD when to dc antibiotics - Tolerating clears and ostomy productive. Advance to full liquids, add chocolate Ensure. Continue TPN today but may be able to start weaning this tomorrow - completed 3 days reglan, will dc today 12/5 - WOC following for new ostomy. Stoma with some mucocutaneous separation, monitor. Will need ostomy clinic referral at discharge  - add oral pain medications and start weaning dilaudid - vac unable to hold seal and was removed, continue BID wet to dry dressing changes - oob, pulm toilet   ID zosyn 11/14>>11/29, 12/2>> VTE - heparin heparin gtt for afib  FEN - TPN, FLD, Ensure    LOS: 21 days    Franne Forts, [facility] Surgery 05/13/2023, 8:43 AM Please see Amion for pager number during day hours 7:00am-4:30pm

## 2023-05-13 NOTE — Progress Notes (Signed)
PHARMACY - TOTAL PARENTERAL NUTRITION CONSULT NOTE  Indication: Prolonged ileus  Patient Measurements: Height: 5' (152.4 cm) Weight: 83.3 kg (183 lb 10.3 oz) IBW/kg (Calculated) : 45.5 TPN AdjBW (KG): 54 Body mass index is 35.87 kg/m. Weight trends: 79.3kg (11/21) >>82.9 kg (11/30)  Assessment:  89 YOF with recurrent sigmoid diverticulitis and associated colovaginal fistula, HFrEF, COPD, Afib with PPM, and gout who was admitted with persistent symptoms with multiple abscesses s/p ex lap with sigmoid colectomy and end left colostomy on 11/19. Patient has been NPO since surgery. Pharmacy consulted to manage TPN.  Glucose / Insulin: hx T2DM, A1c 6.2%; no meds PTA - CBGs < 180 Used 9 units mSSI/24 hours  Electrolytes: K up 3.6 post 6 runs, Mag 1.9, CO2 down 21 post bicarb gtt, Na down 131, others WNL Renal: SCr <1, BUN normalized Hepatic: LFTs / tbili WNL, TG slightly elevated at 154, albumin 2.2 Intake / Output; MIVF: UOP 0 ml/kg/hr, last Lasix 12/2. NG out 12/4, JP drain 0mL, colostomy output (Reglan restarted 12/2>12/5) GI Imaging: 11/21 KUB moderately dilated loops of small bowel, c/w post-operative ileus 11/23 KUB: persistent SBO or ileus, no significant change from prior 11/25 KUB: bowel obstruction or ileus, pleural effusion vs atelectasis 11/25 CTAP: post-op ileus or distal SBO, trace L pleural effusion 11/26 CXR: L > R lower lobe atelectasis 11/29 Abd XR: distention of SB concerning for obstruction or worsening postop ileus 12/1 CT: mild ileus GI Surgeries / Procedures:  11/19 ex lap with sigmoid colectomy and end left colostomy  Central access: PICC placed 04/30/23 TPN start date: 04/30/23  Nutritional Goals: Clinimix E 8/10 1L at 42 ml/hr with SMOF lipids on MTWThFSa Clinimix E 8/10 2L at 82 ml/hr on Sun with SMOFlipids - Average of 1258 kCals (~80% of goal) and 91g AA per day (100% goal) Previously on D10 fluids for add'l kCal >> stopped on 11/26 given concerns  for pleural effusions/atelectasis noted on imaging, weights increased requiring Lasix IV - this has resolved so restarted D10 at 11/29, now changed to sodium bicarb D5 gtt   Electrolytes in Clinimix E 8/10 1L bag: Na 35 mEq, K 30 mEq, Mag 5 mEq, Ca 4.5 mEq, Phos , Acetate 83, Cl 76 mEq  RD Estimated Needs Total Energy Estimated Needs: 1600-1800 kcal Total Protein Estimated Needs: 75-95 gm Total Fluid Estimated Needs: >1.6L  Current Nutrition:  Clear liquids and TPN   Plan:  Clinimix E 8/10 1L at 42 mL/hr and SMOFlipids at 20.8 ml/hr x 12 hrs on MTWThFSa Clinimix E 8/10 2L at 82 mL/hr and SMOFlipids at 20.8 ml/hr x 12 hrs on Sunday TPN providing an average of 1258 kCals (~80% of goal) and 91g AA per day (100% goal) Continue standard MVI and trace elements to TPN Reduce SSI to sensitive Q4H KCl 10 mEq IV x5 Mag 2 g IV x1 Bicarb gtt d/c 12/4 - monitor CO2 closely Monitor TPN labs Mon and Thur and PRN  F/U ability to wean TPN on 12/6, starting FLD per Surgery  Thank you for involving pharmacy in this patient's care.  Loura Back, PharmD, BCPS Clinical Pharmacist Clinical phone for 05/13/2023 is [phone] 05/13/2023 7:14 AM

## 2023-05-13 NOTE — Plan of Care (Signed)
  Problem: Education: Goal: Ability to describe self-care measures that may prevent or decrease complications (Diabetes Survival Skills Education) will improve Outcome: Progressing   Problem: Coping: Goal: Ability to adjust to condition or change in health will improve Outcome: Progressing   Problem: Fluid Volume: Goal: Ability to maintain a balanced intake and output will improve Outcome: Progressing   Problem: Health Behavior/Discharge Planning: Goal: Ability to identify and utilize available resources and services will improve Outcome: Progressing   Problem: Metabolic: Goal: Ability to maintain appropriate glucose levels will improve Outcome: Progressing   Problem: Nutritional: Goal: Maintenance of adequate nutrition will improve Outcome: Progressing Goal: Progress toward achieving an optimal weight will improve Outcome: Progressing   Problem: Skin Integrity: Goal: Risk for impaired skin integrity will decrease Outcome: Progressing   Problem: Tissue Perfusion: Goal: Adequacy of tissue perfusion will improve Outcome: Progressing   Problem: Education: Goal: Knowledge of General Education information will improve Description: Including pain rating scale, medication(s)/side effects and non-pharmacologic comfort measures Outcome: Progressing   Problem: Clinical Measurements: Goal: Ability to maintain clinical measurements within normal limits will improve Outcome: Progressing Goal: Will remain free from infection Outcome: Progressing Goal: Diagnostic test results will improve Outcome: Progressing Goal: Cardiovascular complication will be avoided Outcome: Progressing   Problem: Activity: Goal: Risk for activity intolerance will decrease Outcome: Progressing

## 2023-05-13 NOTE — Consult Note (Addendum)
WOC Nurse Consult Note: Reason for Consult: Requested to assess the surgical wound to investigated a tunneling. The surgical PA she was present to assess the ostomy and following the wound. Please talk to them to further informations.  WOC Nurse ostomy Follow up note Stoma type/location: Stoma is red and viable, 1 1/2 inches, slightly above skin level.  Detachment (stoma separation) 5`clock with 1 cm tunneling, 1.2x0.5 cm, 30 ml of stools.  Pt is was aware, sitting in the chair. The doctor follow the consult to assess the tunneling.  Planning to teach about the ostomy pouch in the next consult.   Applied pouch as follows:  Applied barrier ring and 2 piece convex pouching system. Requested more supplies at the bedside:  Use Supplies: Barrier ring [phone] Pouch #649 Barrier [phone]. 5 pieces of each. Applied Aquacel on the stoma separation.   Placed on Secure Star.   Thank-you,  Denyse Amass BSN, RN, [employer], WOC  (Pager: [phone])

## 2023-05-13 NOTE — Progress Notes (Signed)
Nutrition Follow-up  DOCUMENTATION CODES:   Non-severe (moderate) malnutrition in context of chronic illness  INTERVENTION:  Advance diet as medically applicable Pharmacy to adjust TPN as diet advances Ensure Plus High Protein po BID, each supplement provides 350 kcal and 20 grams of protein.    NUTRITION DIAGNOSIS:   Moderate Malnutrition related to chronic illness as evidenced by mild fat depletion, mild muscle depletion, energy intake < 75% for > 7 days. Improving with interventions in place.     GOAL:   Patient will meet greater than or equal to 90% of their needs    MONITOR:   PO intake, Supplement acceptance, Diet advancement, Labs, Weight trends  REASON FOR ASSESSMENT:   Consult New TPN/TNA  ASSESSMENT:   77 y.o. F, admitted with PMH of hypothyroidism, COPD,OSA (on BiPAP), BOOP (noncandidate for stenting), diverticulitis, CHF, obesity, HTN, gout, and colovaginal fistula. Presented 11/14 for worsening diverticulitis with new bowel perforation.  Pt setting up at side of bed.  Diet advanced to full liquid and she was excited about being able to eat. She stated that she was going to order some grits or soup.  She was in agreement to receive ensure TID. To help meet needs.   Continues to receive TPN support  Pharmacy plan: Clinimix E 8/10 1L at 42 mL/hr and SMOFlipids at 20.8 ml/hr x 12 hrs on MTWThFSa Clinimix E 8/10 2L at 82 mL/hr and SMOFlipids at 20.8 ml/hr x 12 hrs on Sunday TPN providing an average of 1258 kCals (~80% of goal) and 91g AA per day (100% goal) Continue standard MVI and trace elements to TPN Reduce SSI to sensitive Q4H KCl 10 mEq IV x5 Mag 2 g IV x1 Bicarb gtt d/c 12/4 - monitor CO2 closely Monitor TPN labs Mon and Thur and PRN  F/U ability to wean TPN on 12/6, starting FLD per Surgery  11/21 KUB moderately dilated loops of small bowel, c/w post-operative ileus 11/23 KUB: persistent SBO or ileus, no significant change from prior 11/25 KUB:  bowel obstruction or ileus, pleural effusion vs atelectasis 11/25 CTAP: post-op ileus or distal SBO, trace L pleural effusion 11/26 CXR: L > R lower lobe atelectasis 11/29 Abd XR: distention of SB concerning for obstruction or worsening postop ileus 12/1 CT: mild ileus NUTRITION - FOCUSED PHYSICAL EXAM:  Flowsheet Row Most Recent Value  Orbital Region Mild depletion  Upper Arm Region Mild depletion  Thoracic and Lumbar Region Unable to assess  [Fluid]  Buccal Region No depletion  Temple Region Moderate depletion  Clavicle Bone Region Unable to assess  [Fluid]  Clavicle and Acromion Bone Region Mild depletion  Scapular Bone Region Mild depletion  Dorsal Hand Mild depletion  Patellar Region Unable to assess  [Fluid]  Anterior Thigh Region Unable to assess  [Fluid]  Posterior Calf Region Unable to assess  [Fluid]  Edema (RD Assessment) Severe  [Abdomen and leg edema]  Hair Reviewed  Eyes Reviewed  Mouth Reviewed  Skin Reviewed  Nails Reviewed       Diet Order:   Diet Order             Diet full liquid Fluid consistency: Thin  Diet effective now                   EDUCATION NEEDS:   Education needs have been addressed  Skin:  Skin Assessment: Reviewed RN Assessment Skin Integrity Issues:: Wound VAC Wound Vac: abdomen Other: new colostomy (11/18)  Last BM:  12/01  Height:  Ht Readings from Last 1 Encounters:  04/27/23 5' (1.524 m)    Weight:   Wt Readings from Last 1 Encounters:  05/11/23 83.3 kg    Ideal Body Weight:  45.45 kg  BMI:  Body mass index is 35.87 kg/m.  Estimated Nutritional Needs:   Kcal:  1600-1800 kcal  Protein:  75-95 gm  Fluid:  >1.6L    Jamelle Haring RDN, LDN Clinical Dietitian  RDN pager # available on Amion

## 2023-05-14 ENCOUNTER — Inpatient Hospital Stay (HOSPITAL_COMMUNITY): Payer: Medicare Other

## 2023-05-14 DIAGNOSIS — E039 Hypothyroidism, unspecified: Secondary | ICD-10-CM | POA: Diagnosis not present

## 2023-05-14 DIAGNOSIS — E1140 Type 2 diabetes mellitus with diabetic neuropathy, unspecified: Secondary | ICD-10-CM | POA: Diagnosis not present

## 2023-05-14 DIAGNOSIS — E440 Moderate protein-calorie malnutrition: Secondary | ICD-10-CM | POA: Diagnosis not present

## 2023-05-14 DIAGNOSIS — K5720 Diverticulitis of large intestine with perforation and abscess without bleeding: Secondary | ICD-10-CM | POA: Diagnosis not present

## 2023-05-14 LAB — BASIC METABOLIC PANEL
Anion gap: 8 (ref 5–15)
BUN: 13 mg/dL (ref 8–23)
CO2: 21 mmol/L — ABNORMAL LOW (ref 22–32)
Calcium: 8.7 mg/dL — ABNORMAL LOW (ref 8.9–10.3)
Chloride: 104 mmol/L (ref 98–111)
Creatinine, Ser: 0.47 mg/dL (ref 0.44–1.00)
GFR, Estimated: >60 mL/min (ref >60)
Glucose, Bld: 158 mg/dL — ABNORMAL HIGH (ref 70–99)
Potassium: 4.3 mmol/L (ref 3.5–5.1)
Sodium: 133 mmol/L — ABNORMAL LOW (ref 135–145)

## 2023-05-14 LAB — CBC
HCT: 25 % — ABNORMAL LOW (ref 36.0–46.0)
Hemoglobin: 7.4 g/dL — ABNORMAL LOW (ref 12.0–15.0)
MCH: 25.2 pg — ABNORMAL LOW (ref 26.0–34.0)
MCHC: 29.6 g/dL — ABNORMAL LOW (ref 30.0–36.0)
MCV: 85 fL (ref 80.0–100.0)
Platelets: 204 10*3/uL (ref 150–400)
RBC: 2.94 MIL/uL — ABNORMAL LOW (ref 3.87–5.11)
RDW: 17.7 % — ABNORMAL HIGH (ref 11.5–15.5)
WBC: 8 10*3/uL (ref 4.0–10.5)
nRBC: 0.4 % — ABNORMAL HIGH (ref 0.0–0.2)

## 2023-05-14 LAB — MAGNESIUM: Magnesium: 1.7 mg/dL (ref 1.7–2.4)

## 2023-05-14 LAB — HEPARIN LEVEL (UNFRACTIONATED): Heparin Unfractionated: 0.46 [IU]/mL (ref 0.30–0.70)

## 2023-05-14 LAB — GLUCOSE, CAPILLARY
Glucose-Capillary: 146 mg/dL — ABNORMAL HIGH (ref 70–99)
Glucose-Capillary: 148 mg/dL — ABNORMAL HIGH (ref 70–99)
Glucose-Capillary: 156 mg/dL — ABNORMAL HIGH (ref 70–99)
Glucose-Capillary: 156 mg/dL — ABNORMAL HIGH (ref 70–99)
Glucose-Capillary: 162 mg/dL — ABNORMAL HIGH (ref 70–99)
Glucose-Capillary: 174 mg/dL — ABNORMAL HIGH (ref 70–99)

## 2023-05-14 MED ORDER — HYDROMORPHONE HCL 1 MG/ML IJ SOLN
0.5000 mg | INTRAMUSCULAR | Status: DC | PRN
  Administered 2023-05-14 – 2023-05-17 (×16): 1 mg via INTRAVENOUS
  Filled 2023-05-14 (×17): qty 1

## 2023-05-14 MED ORDER — HYDROMORPHONE HCL 1 MG/ML IJ SOLN
0.5000 mg | INTRAMUSCULAR | Status: DC | PRN
  Administered 2023-05-14: 0.5 mg via INTRAVENOUS
  Filled 2023-05-14: qty 0.5

## 2023-05-14 MED ORDER — MAGNESIUM SULFATE 2 GM/50ML IV SOLN
2.0000 g | Freq: Once | INTRAVENOUS | Status: AC
Start: 2023-05-14 — End: 2023-05-14
  Administered 2023-05-14: 2 g via INTRAVENOUS
  Filled 2023-05-14: qty 50

## 2023-05-14 MED ORDER — METOCLOPRAMIDE HCL 5 MG/ML IJ SOLN
10.0000 mg | Freq: Four times a day (QID) | INTRAMUSCULAR | Status: AC
  Administered 2023-05-14 – 2023-05-16 (×8): 10 mg via INTRAVENOUS
  Filled 2023-05-14 (×8): qty 2

## 2023-05-14 MED ORDER — INSULIN ASPART 100 UNIT/ML IJ SOLN
0.0000 [IU] | Freq: Three times a day (TID) | INTRAMUSCULAR | Status: DC
  Administered 2023-05-14 – 2023-05-15 (×2): 2 [IU] via SUBCUTANEOUS

## 2023-05-14 MED ORDER — TRACE MINERALS CU-MN-SE-ZN 300-55-60-3000 MCG/ML IV SOLN
INTRAVENOUS | Status: AC
  Filled 2023-05-14 (×2): qty 1000

## 2023-05-14 MED ORDER — FAT EMUL FISH OIL/PLANT BASED 20% (SMOFLIPID)IV EMUL
250.0000 mL | INTRAVENOUS | Status: AC
  Administered 2023-05-14: 250 mL via INTRAVENOUS
  Filled 2023-05-14: qty 250

## 2023-05-14 NOTE — Progress Notes (Signed)
PHARMACY - TOTAL PARENTERAL NUTRITION CONSULT NOTE  Indication: Prolonged ileus  Patient Measurements: Height: 5' (152.4 cm) Weight: 88.8 kg (195 lb 12.3 oz) IBW/kg (Calculated) : 45.5 TPN AdjBW (KG): 54 Body mass index is 38.23 kg/m. Weight trends: 79.3kg (11/21) >>82.9 kg (11/30)  Assessment:  65 YOF with recurrent sigmoid diverticulitis and associated colovaginal fistula, HFrEF, COPD, Afib with PPM, and gout who was admitted with persistent symptoms with multiple abscesses s/p ex lap with sigmoid colectomy and end left colostomy on 11/19. Patient has been NPO since surgery. Pharmacy consulted to manage TPN.  Glucose / Insulin:  BG < 190, Used 7 units SSI/24 hours  -hx T2DM, A1c 6.2%; no meds PTA - Electrolytes: Na 133, K  4.3 ( s/p IV 50 mEq), CO2 21 stable, Mg 1.7 Renal: SCr 0.47, BUN wnl Hepatic: LFTs / tbili WNL, TG slightly elevated at 154, albumin 2.2 Intake / Output; MIVF: metoclopramide 12/2>> 12/8; UOP 0.4 ml/kg/hr s/p torsemide 40mg  po. JP drain 5 mL, colostomy output 30mL  GI Imaging: 11/21 KUB moderately dilated loops of small bowel, c/w post-operative ileus 11/23 KUB: persistent SBO or ileus, no significant change from prior 11/25 KUB: bowel obstruction or ileus, pleural effusion vs atelectasis 11/25 CTAP: post-op ileus or distal SBO, trace L pleural effusion 11/26 CXR: L > R lower lobe atelectasis 11/29 Abd XR: distention of SB concerning for obstruction or worsening postop ileus 12/1 CT: mild ileus GI Surgeries / Procedures:  11/19 ex lap with sigmoid colectomy and end left colostomy  Central access: PICC placed 04/30/23 TPN start date: 04/30/23  Nutritional Goals: Clinimix E 8/10 1L at 42 ml/hr with SMOF lipids on MTWThFSa Clinimix E 8/10 2L at 82 ml/hr on Sun with SMOFlipids - Average of 1258 kCals (~80% of goal) and 91g AA per day (100% goal) Previously on D10 fluids for add'l kCal >> stopped on 11/26 given concerns for pleural effusions/atelectasis noted  on imaging, weights increased requiring Lasix IV - this has resolved so restarted D10 at 11/29, now changed to sodium bicarb D5 gtt   RD Estimated Needs Total Energy Estimated Needs: 1600-1800 kcal Total Protein Estimated Needs: 75-95 gm Total Fluid Estimated Needs: >1.6L  Current Nutrition:  TPN 12/5 CLD with worsening abd pain and N/V 12/6 NPO: bloated, + flatus; restart metoclopramide   Plan:  Clinimix E 8/10 1L at 42 mL/hr on MTuWThFriSa Clinimix E 8/10 2L at 82 mL/hr on Sun SMOFlipids over 12 hrs daily  TPN providing an average of 1258 kCals (~80% of goal) and 91g AA per day (100% goal) Electrolytes in Clinimix E 8/10 1L bag: Na 35 mEq, K 30 mEq, Mag 5 mEq, Ca 4.5 mEq, Phos , Acetate 83, Cl 76 mEq Electrolytes in Clinimix E 8/10 2L bag: Na 69 mEq, K 59 mEq, Mag 9.8 mEq, Ca 8.9 mEq, Phos 29.5 mmol,Cl:Acet 1:2 Given Mg 2g Continue standard MVI and trace elements to TPN Reduce SSI to sensitive Q8H, stop if low needs continue  Monitor TPN labs Mon/Thur and PRN  F/U PO intake, wean off TPN as able  Thank you for involving pharmacy in this patient's care.  Alphia Moh, PharmD, BCPS, BCCP Clinical Pharmacist  Please check AMION for all [facility] Pharmacy phone numbers After 10:00 PM, call Main Pharmacy [phone]

## 2023-05-14 NOTE — Progress Notes (Signed)
Mobility Specialist Progress Note;   05/14/23 1425  Mobility  Activity Transferred from chair to bed  Level of Assistance Minimal assist, patient does 75% or more  Assistive Device Other (Comment) (HHA)  Distance Ambulated (ft) 5 ft  Activity Response Tolerated fair  Mobility Referral Yes  Mobility visit 1 Mobility  Mobility Specialist Start Time (ACUTE ONLY) 1425  Mobility Specialist Stop Time (ACUTE ONLY) 1435  Mobility Specialist Time Calculation (min) (ACUTE ONLY) 10 min   Pt requesting assistance to bed. Required MinA via HHA for transfer from chair to bed. VSS throughout. C/o right abd pain at EOS. Pt left in bed with all needs met, alarm on.   Caesar Bookman Mobility Specialist Please contact via SecureChat or Rehab Office [phone]

## 2023-05-14 NOTE — Progress Notes (Signed)
Occupational Therapy Treatment Patient Details Name: Kathleen [name] MRN: [id number] DOB: [birth year] Today's Date: 05/14/2023   History of present illness 77 y.o. 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 S/p exploratory laparotomy with sigmoid colectomy and end left colostomy by Dr. Dwain Sarna on 04/27/2023.  PMHx: colovaginal fistula associated with diverticulitis, obesity, OSA , BOOP, tracheobronchomalacia, chronic respiratory failure on 4 to 5 L oxygen at baseline, persistent A-fib S/P pacemaker implant 2017, chronic systolic heart failure, recent admission from 04/09/2023 to 04/13/2023.   OT comments  Pt receptive to OT session, continues to c/o RLQ abdominal pain that seems to shoot into her back. Pt expressing that she is not sure what the plan is for resolving her troubles, encourage pt to speak with MD to further discuss more and seek anything she would like to know regarding her medical status. Pt performing UB strengthening exercises with therabands well, may look to increase resistance or sets prn. OT to continue to progress pt as able, DC plans remain appropriate for SNF.       If plan is discharge home, recommend the following:  A little help with walking and/or transfers;A lot of help with bathing/dressing/bathroom;Assistance with cooking/housework;Assist for transportation;Help with stairs or ramp for entrance   Equipment Recommendations  None recommended by OT    Recommendations for Other Services      Precautions / Restrictions Precautions Precautions: Fall Precaution Comments: multiple drains, NGT, wound vac Restrictions Weight Bearing Restrictions: No       Mobility Bed Mobility   Bed Mobility: Sit to Supine       Sit to supine: Min assist   General bed mobility comments: Min A for BLEs, pt with good trunk control to rotate body simultaneously as legs move    Transfers Overall transfer level: Needs  assistance Equipment used: 1 person hand held assist Transfers: Sit to/from Stand, Bed to chair/wheelchair/BSC Sit to Stand: Contact guard assist     Step pivot transfers: Contact guard assist     General transfer comment: Pt on BSC on OT arrival     Balance Overall balance assessment: Needs assistance Sitting-balance support: No upper extremity supported, Feet supported Sitting balance-Leahy Scale: Good Sitting balance - Comments: sitting EOB   Standing balance support: During functional activity, Single extremity supported Standing balance-Leahy Scale: Fair Standing balance comment: Pt requires UE support for balance                           ADL either performed or assessed with clinical judgement   ADL                           Toilet Transfer: Contact guard assist;Stand-pivot;BSC/3in1   Toileting- Clothing Manipulation and Hygiene: Contact guard assist;Sit to/from stand Toileting - Clothing Manipulation Details (indicate cue type and reason): for wiping     Functional mobility during ADLs: Contact guard assist      Extremity/Trunk Assessment              Vision       Perception     Praxis      Cognition Arousal: Alert Behavior During Therapy: WFL for tasks assessed/performed Overall Cognitive Status: Within Functional Limits for tasks assessed  Exercises General Exercises - Upper Extremity Shoulder Flexion: AROM, Theraband, 10 reps, Both, Strengthening, Seated Theraband Level (Shoulder Flexion): Level 3 (Green) Shoulder Horizontal ABduction: AROM, Both, 10 reps, Theraband, Strengthening, Supine Theraband Level (Shoulder Horizontal Abduction): Level 3 (Green) Elbow Flexion: AROM, Strengthening, Both, 10 reps, Supine, Theraband Theraband Level (Elbow Flexion): Level 3 (Green) Elbow Extension: AROM, 10 reps, Strengthening, Both, Supine, Theraband Theraband Level (Elbow  Extension): Level 3 (Green)    Shoulder Instructions       General Comments Mentioned to RN that ostomy bag was full and he drained it during session, also notified him that pt reports abdominal pain while urinating now    Pertinent Vitals/ Pain       Pain Assessment Pain Assessment: Faces Faces Pain Scale: Hurts even more Pain Location: R abdomen Pain Descriptors / Indicators: Grimacing, Discomfort, Shooting (shoots from abdomen into her back) Pain Intervention(s): Limited activity within patient's tolerance, Monitored during session, Repositioned  Home Living                                          Prior Functioning/Environment              Frequency  Min 1X/week        Progress Toward Goals  OT Goals(current goals can now be found in the care plan section)  Progress towards OT goals: Progressing toward goals  Acute Rehab OT Goals Patient Stated Goal: to manage pain OT Goal Formulation: With patient Time For Goal Achievement: 05/12/23 Potential to Achieve Goals: Good  Plan      Co-evaluation                 AM-PAC OT "6 Clicks" Daily Activity     Outcome Measure   Help from another person eating meals?: None Help from another person taking care of personal grooming?: A Little Help from another person toileting, which includes using toliet, bedpan, or urinal?: A Little Help from another person bathing (including washing, rinsing, drying)?: Total Help from another person to put on and taking off regular upper body clothing?: A Lot Help from another person to put on and taking off regular lower body clothing?: Total 6 Click Score: 14    End of Session    OT Visit Diagnosis: Unsteadiness on feet (R26.81);Other abnormalities of gait and mobility (R26.89);Muscle weakness (generalized) (M62.81);Pain Pain - part of body:  (abdomen)   Activity Tolerance Patient tolerated treatment well   Patient Left in bed;with call bell/phone  within reach;with bed alarm set   Nurse Communication Mobility status        Time: 9147-8295 OT Time Calculation (min): 27 min  Charges: OT General Charges $OT Visit: 1 Visit OT Treatments $Self Care/Home Management : 8-22 mins $Therapeutic Exercise: 8-22 mins  05/14/2023  AB, OTR/L  Acute Rehabilitation Services  Office: [phone]   Tristan Schroeder 05/14/2023, 6:06 PM

## 2023-05-14 NOTE — Progress Notes (Signed)
Patient ID: Kathleen [name], female   DOB: [birth year], 77 y.o.   MRN: [id number] [facility] Surgery Progress Note  17 Days Post-Op  Subjective: CC-  Worsening abdominal pain and n/v over night. Last episode of emesis around 0300. Denies any current nausea but is still bloated. Pain is less. Ostomy still productive of gas and stool. JP drain bulbs missing.  Objective: Vital signs in last 24 hours: Temp:  [97.6 F (36.4 C)-97.8 F (36.6 C)] 97.6 F (36.4 C) (12/06 0800) Pulse Rate:  [70-78] 78 (12/06 0800) Resp:  [16-21] 16 (12/06 0800) BP: (98-131)/(46-87) 98/46 (12/06 0800) SpO2:  [98 %-100 %] 98 % (12/06 0340) Weight:  [88.8 kg] 88.8 kg (12/06 0456) Last BM Date : 05/12/23  Intake/Output from previous day: 12/05 0701 - 12/06 0700 In: 1718.1 [I.V.:1368.1; IV Piggyback:350] Out: 985 [Urine:950; Drains:5; Stool:30] Intake/Output this shift: No intake/output data recorded.  PE: Gen:  Alert, NAD Card:  RRR Pulm:  rate and effort normal on supplemental O2 via Rabun Abd: soft, increased distension, mild generalized tenderness, open midline stable with few areas of breakdown and separation but fascia appears intact/ otherwise mostly healthy pink granulation tissue, stoma difficult to visualized through bag/ air and thin dark brown stool in bag, JP x2 bulbs replaced/ no significant output  Lab Results:  Recent Labs    05/12/23 0342 05/13/23 0407  WBC 8.9 8.1  HGB 8.3* 7.9*  HCT 26.5* 26.4*  PLT 251 233   BMET Recent Labs    05/12/23 0342 05/13/23 0407  NA 135 131*  K 3.4* 3.6  CL 107 102  CO2 22 21*  GLUCOSE 150* 150*  BUN 15 13  CREATININE 0.47 0.46  CALCIUM 7.9* 8.1*   PT/INR No results for input(s): "LABPROT", "INR" in the last 72 hours. CMP     Component Value Date/Time   NA 131 (L) 05/13/2023 0407   NA 130 (L) 12/28/2012 0748   K 3.6 05/13/2023 0407   K 4.6 12/28/2012 0748   CL 102 05/13/2023 0407   CL 95 (L) 12/28/2012 0748   CO2 21 (L) 05/13/2023  0407   CO2 23 12/28/2012 0748   GLUCOSE 150 (H) 05/13/2023 0407   GLUCOSE 391 (H) 12/28/2012 0748   BUN 13 05/13/2023 0407   BUN 32 (H) 12/28/2012 0748   CREATININE 0.46 05/13/2023 0407   CREATININE 0.86 01/27/2023 1137   CALCIUM 8.1 (L) 05/13/2023 0407   CALCIUM 14.3 (HH) 06/26/2019 1618   PROT 5.0 (L) 05/13/2023 0407   ALBUMIN 2.2 (L) 05/13/2023 0407   AST 12 (L) 05/13/2023 0407   ALT 13 05/13/2023 0407   ALKPHOS 66 05/13/2023 0407   BILITOT 0.5 05/13/2023 0407   GFRNONAA >60 05/13/2023 0407   GFRNONAA 43 (L) 12/28/2012 0748   GFRAA 58 (L) 06/28/2019 0427   GFRAA 50 (L) 12/28/2012 0748   Lipase     Component Value Date/Time   LIPASE 30 04/22/2023 1840       Studies/Results: DG Abd Portable 1V  Result Date: 05/14/2023 CLINICAL DATA:  77 year old female with abdominal pain and vomiting. Status post abdominal surgery, descending colostomy. EXAM: PORTABLE ABDOMEN - 1 VIEW COMPARISON:  Portable chest and CT Abdomen and Pelvis 05/09/2023. FINDINGS: Portable AP supine views at 0330 hours. Stable lung bases. Cardiac pacemaker leads again noted. Retrocardiac hypo ventilation on going. Enteric tube has been removed since 05/09/2023. Gas-filled small and large bowel loops (transverse colon). The small bowel is less dilated compared to 05/09/2023, but not yet  normal. Similar volume of gas in the stomach. Left lower quadrant ostomy is evident. Abdominal drain remains in place across the lower abdomen and pelvis. No pneumoperitoneum is apparent on this supine view. Stable cholecystectomy clips. Stable visualized osseous structures. IMPRESSION: 1. Improved but not normalized bowel gas pattern since 05/09/2023. Pattern most resembles ileus. 2. Lower abdominal, enteric tube removed. Lower abdominal, pelvic drain remains in place. 3. Left lung base hypo ventilation. Electronically Signed   By: Odessa Fleming M.D.   On: 05/14/2023 04:27    Anti-infectives: Anti-infectives (From admission, onward)     Start     Dose/Rate Route Frequency Ordered Stop   05/10/23 1000  piperacillin-tazobactam (ZOSYN) IVPB 3.375 g        3.375 g 12.5 mL/hr over 240 Minutes Intravenous Every 8 hours 05/10/23 0914     05/03/23 1400  piperacillin-tazobactam (ZOSYN) IVPB 3.375 g        3.375 g 12.5 mL/hr over 240 Minutes Intravenous Every 8 hours 05/03/23 0935 05/07/23 0124   04/27/23 1021  sodium chloride 0.9 % with cefoTEtan (CEFOTAN) ADS Med       Note to Pharmacy: Shanda Bumps M: cabinet override      04/27/23 1021 04/27/23 1253   04/26/23 0800  cefoTEtan (CEFOTAN) 2 g in sodium chloride 0.9 % 100 mL IVPB  Status:  Discontinued        2 g 200 mL/hr over 30 Minutes Intravenous To ShortStay Surgical 04/23/23 2111 04/27/23 0800   04/22/23 2300  piperacillin-tazobactam (ZOSYN) IVPB 3.375 g        3.375 g 12.5 mL/hr over 240 Minutes Intravenous Every 8 hours 04/22/23 2256 05/02/23 2359   04/22/23 1900  piperacillin-tazobactam (ZOSYN) IVPB 3.375 g        3.375 g 100 mL/hr over 30 Minutes Intravenous  Once 04/22/23 1852 04/22/23 1954        Assessment/Plan POD #17 elap, sigmoid colectomy, colostomy 11/19- MW - CT 12/1 ileus, small fluid collection near colonic stump 3.5x1.6cm.  Zosyn restarted 12/2. JP x2 were purulent appearing but now with minimal output x24 hours - n/v over night. Xray looks like mild ileus. Ostomy is productive of stool and air this morning. Back diet down to ice chips. Restart reglan x2 days. Continue TPN. Low threshold to replace NG if any ongoing n/v. Check labs - WOC following for new ostomy. Stoma with some mucocutaneous separation, monitor. Will need ostomy clinic referral at discharge  - vac unable to hold seal and was removed, continue BID wet to dry dressing changes - oob, pulm toilet   ID zosyn 11/14>>11/29, 12/2>> VTE - heparin heparin gtt for afib  FEN - TPN, ice chips    LOS: 22 days    Franne Forts, [facility] Surgery 05/14/2023, 8:40 AM Please see  Amion for pager number during day hours 7:00am-4:30pm

## 2023-05-14 NOTE — Progress Notes (Signed)
PHARMACY - ANTICOAGULATION CONSULT NOTE  Pharmacy Consult for Heparin gtt Indication: atrial fibrillation  Patient Measurements: Height: 5' (152.4 cm) Weight: 88.8 kg (195 lb 12.3 oz) IBW/kg (Calculated) : 45.5 Heparin Dosing Weight: 64 kg  Vital Signs: Temp: 97.8 F (36.6 C) (12/06 0340) Temp Source: Oral (12/06 0340) BP: 118/56 (12/06 0340) Pulse Rate: 71 (12/06 0340)  Labs: Recent Labs    05/12/23 0342 05/13/23 0407 05/14/23 0219  HGB 8.3* 7.9*  --   HCT 26.5* 26.4*  --   PLT 251 233  --   HEPARINUNFRC 0.53 0.48 0.46  CREATININE 0.47 0.46  --    Estimated Creatinine Clearance: 59.3 mL/min (by C-G formula based on SCr of 0.46 mg/dL).  Assessment: [name] with history of atrial fibrillation on Xarelto prior to admission. Last dose of Xarelto 04/21/23 at 1900. Admitted for recurrent diverticulitis, perforation, and perirectal abscess. 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 Cleared by surgery to start hep gtt for afib.  Heparin remains therapeutic at 0.46 on 1900 units/hr. Hgb down slightly to 7.4, PLT stable at 204. No issues with infusion and no bleeding noted per RN.   Goal of Therapy:  Heparin level 0.3-0.7 units/ml Monitor platelets by anticoagulation protocol: Yes   Plan:  No bolus per surgery Continue heparin at 1900 units/hr Heparin level daily/CBC  Monitor for signs/symptoms of bleeding  Lennie Muckle, PharmD PGY1 Pharmacy Resident 05/14/2023 7:21 AM

## 2023-05-14 NOTE — Progress Notes (Addendum)
  Patient is complaining about abdominal pain and no output through the colostomy bag today. -Obtaining stat abdominal x-ray to rule out SBO.  Update, X-ray abdomen following finding:  improved but not normalized bowel gas pattern since 05/09/2023. Pattern most resembles ileus. 2. Lower abdominal, enteric tube removed. Lower abdominal, pelvic drain remains in place. 3. Left lung base hypo ventilation.   -X-ray of abdomen did not show any evidence of SBO and ileus has been improved.  Continue to monitor clinically.  Tereasa Coop, MD [facility] 05/14/2023, 4:35 AM

## 2023-05-14 NOTE — Progress Notes (Signed)
Physical Therapy Treatment Patient Details Name: Kathleen [name] MRN: [id number] DOB: [birth year] Today's Date: 05/14/2023   History of Present Illness 77 y.o. 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 S/p exploratory laparotomy with sigmoid colectomy and end left colostomy by Dr. Dwain Sarna on 04/27/2023.  PMHx: colovaginal fistula associated with diverticulitis, obesity, OSA , BOOP, tracheobronchomalacia, chronic respiratory failure on 4 to 5 L oxygen at baseline, persistent A-fib S/P pacemaker implant 2017, chronic systolic heart failure, recent admission from 04/09/2023 to 04/13/2023.    PT Comments  Pt received sitting on the [facility] and agreeable to session. Pt able to void and attempt to perform pericare, however requires assist. Pt able to step to recliner, however requires UE support with pt reaching out to chair and completes transfer with HHA. Pt requires a seated rest break due to fatigue and abdominal pain. Pt able to stand and perform static marches with HHA and pt reaching to wall for additional support. Pt then requires another seated rest break. Pt continues to benefit from PT services to progress toward functional mobility goals.    If plan is discharge home, recommend the following: A lot of help with walking and/or transfers;A lot of help with bathing/dressing/bathroom;Assistance with cooking/housework;Assist for transportation   Can travel by private vehicle     No  Equipment Recommendations  None recommended by PT    Recommendations for Other Services       Precautions / Restrictions Precautions Precautions: Fall Precaution Comments: multiple drains, NGT, wound vac Restrictions Weight Bearing Restrictions: No     Mobility  Bed Mobility               General bed mobility comments: Pt OOB upon arrival    Transfers Overall transfer level: Needs assistance Equipment used: 1 person hand held assist Transfers: Sit  to/from Stand, Bed to chair/wheelchair/BSC Sit to Stand: Contact guard assist   Step pivot transfers: Contact guard assist       General transfer comment: STS from [facility] x2 and recliner x2 with CGA for safety and increased time for power up. Pt able to step to recliner with HHA for balance        Balance Overall balance assessment: Needs assistance Sitting-balance support: No upper extremity supported, Feet supported Sitting balance-Leahy Scale: Good     Standing balance support: During functional activity, Single extremity supported Standing balance-Leahy Scale: Fair Standing balance comment: Pt requires UE support for balance                            Cognition Arousal: Alert Behavior During Therapy: WFL for tasks assessed/performed Overall Cognitive Status: Within Functional Limits for tasks assessed                                          Exercises General Exercises - Lower Extremity Long Arc Quad: AROM, Seated, Both, 10 reps Hip Flexion/Marching: AROM, Both, 10 reps, Standing    General Comments        Pertinent Vitals/Pain Pain Assessment Pain Assessment: Faces Faces Pain Scale: Hurts even more Pain Location: R abdomen Pain Descriptors / Indicators: Grimacing, Discomfort Pain Intervention(s): Limited activity within patient's tolerance, Monitored during session, Repositioned     PT Goals (current goals can now be found in the care plan section) Acute Rehab PT Goals Patient Stated Goal:  Reduce pain PT Goal Formulation: With patient Time For Goal Achievement: 05/12/23 Progress towards PT goals: Progressing toward goals    Frequency    Min 1X/week       AM-PAC PT "6 Clicks" Mobility   Outcome Measure  Help needed turning from your back to your side while in a flat bed without using bedrails?: None Help needed moving from lying on your back to sitting on the side of a flat bed without using bedrails?: None Help needed  moving to and from a bed to a chair (including a wheelchair)?: A Little Help needed standing up from a chair using your arms (e.g., wheelchair or bedside chair)?: A Little Help needed to walk in hospital room?: A Lot Help needed climbing 3-5 steps with a railing? : Total 6 Click Score: 17    End of Session Equipment Utilized During Treatment: Oxygen Activity Tolerance: Patient limited by pain;Patient limited by fatigue Patient left: in chair;with call bell/phone within reach;with chair alarm set Nurse Communication: Mobility status PT Visit Diagnosis: Unsteadiness on feet (R26.81);Muscle weakness (generalized) (M62.81);Difficulty in walking, not elsewhere classified (R26.2);Pain     Time: 6045-4098 PT Time Calculation (min) (ACUTE ONLY): 18 min  Charges:    $Therapeutic Activity: 8-22 mins PT General Charges $$ ACUTE PT VISIT: 1 Visit                     Johny Shock, PTA Acute Rehabilitation Services Secure Chat Preferred  [phone]    Johny Shock 05/14/2023, 2:11 PM

## 2023-05-14 NOTE — Progress Notes (Signed)
PROGRESS NOTE        PATIENT DETAILS Name: Kathleen [name] Age: 77 y.o. Sex: female Date of Birth: February 28, 1946 Admit Date: 04/22/2023 Admitting Physician Gery Pray, MD AOZ:[id number], Bernadene Bell, MD  Brief Summary:  Patient is a 77 y.o.  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  Admitted by [facility]-started on empiric IV antibiotics-underwent preop assessment by cardiology-and then subsequently underwent  exploratory laparotomy with sigmoid colectomy and end ostomy on 11/19.  Postoperative course complicated by ileus-requiring NG tube decompression-and TNA for nutrition.  See below for further details.  Significant events: 11/14>> admit to [facility] 11/19>>exploratory laparotomy with sigmoid colectomy and end ostomy  12/04>> NG removed-clear liquid started  Significant studies: 11/14>> CT abdomen/pelvis: Worsening signal right diverticulitis-localized perforation-5.6 cm perirectal abscess, 6.0 cm left lower quadrant abscess. 11/22>> x-ray abdomen: Consistent with postoperative ileus. 11/25>> CT abdomen/pelvis: No abscess 12/01>> CT abdomen/pelvis: Mild small bowel ileus-no obvious abscess-small fluid collection in the colonic stump  Significant microbiology data: 11/14>> blood culture: No growth 11/14>> COVID/influenza/RSV PCR: Negative  Procedures: 11/19>>exploratory laparotomy with sigmoid colectomy  end ostomy on 11/19  Consults: Cardiology GI General surgery  Subjective: Developed some vomiting and right mid abdominal pain overnight.  JP bulbs missing from both drains.  Objective: Vitals: Blood pressure 103/65, pulse 75, temperature 97.6 F (36.4 C), temperature source Oral, resp. rate 14, height 5' (1.524 m), weight 88.8 kg, SpO2 99%.   Exam: Gen  Exam:Alert awake-not in any distress HEENT:atraumatic, normocephalic Chest: B/L clear to auscultation anteriorly CVS:S1S2 regular Abdomen:soft-in the right mid abdominal area Extremities:no edema Neurology: Non focal Skin: no rash   Pertinent Labs/Radiology:    Latest Ref Rng & Units 05/14/2023    8:38 AM 05/13/2023    4:07 AM 05/12/2023    3:42 AM  CBC  WBC 4.0 - 10.5 K/uL 8.0  8.1  8.9   Hemoglobin 12.0 - 15.0 g/dL 7.4  7.9  8.3   Hematocrit 36.0 - 46.0 % 25.0  26.4  26.5   Platelets 150 - 400 K/uL 204  233  251     Lab Results  Component Value Date   NA 133 (L) 05/14/2023   K 4.3 05/14/2023   CL 104 05/14/2023   CO2 21 (L) 05/14/2023     Assessment/Plan: 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 with localized perforation and pelvic abscess-s/p ex lap with colectomy/ostomy on 11/19-complicated by postoperative ileus requiring NG tube decompression Gradually improving -bowel function slowly returning NG tube removed 12/4-was tolerating liquids till yesterday-however overnight-bulb on the JP drains got dislodged-and started having pain/vomiting.   Bulbs reattached to JP drain today by general surgery-now n.p.o.-supportive care continues-if pain/vomiting persists-will need reimaging. Remain on Zosyn due to purulent discharge from one of the drains several days ago.  Zosyn was restarted 12/2. Remains on TNA for now.  Hypokalemia/hypophosphatemia  Replete per pharmacy protocol.  Normocytic anemia Secondary to critical illness-some minimal amount of blood seen on the ostomy-but doubt any active or chronic bleeding (chronic blood loss ruled out) Follow CBC and transfuse accordingly.  Persistent atrial fibrillation Telemetry monitoring Tikosyn orally IV heparin-probably could be switched to Eliquis in the next day or so. Attempt to keep K> 4  History of AV nodal  ablation-s/p PPM ([employer]) Telemetry monitoring  Chronic HFrEF (EF 35-40% on 4/9  by TTE) Volume status reasonably stable but some leg edema today Will resume oral Lasix/Aldactone today.  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  GERD PPI  DM-2 (A1c 6.2 on 11/15) CBGs stable on SSI  Recent Labs    05/14/23 0449 05/14/23 0813 05/14/23 1155  GLUCAP 174* 148* 162*   Nutrition Status: Nutrition Problem: Moderate Malnutrition Etiology: chronic illness Signs/Symptoms: mild fat depletion, mild muscle depletion, energy intake < 75% for > 7 days Interventions: Ensure Enlive (each supplement provides 350kcal and 20 grams of protein), Magic cup, MVI  Obesity: Estimated body mass index is 38.23 kg/m as calculated from the following:   Height as of this encounter: 5' (1.524 m).   Weight as of this encounter: 88.8 kg.   Code status:   Code Status: Limited: Do not attempt resuscitation (DNR) -DNR-LIMITED -Do Not Intubate/DNI    DVT Prophylaxis: IV heparin   Family Communication: None at bedside.   Disposition Plan: Status is: Inpatient Remains inpatient appropriate because: Severity of illness   Planned Discharge Destination:Skilled nursing facility   Diet: Diet Order             Diet NPO time specified Except for: Ice Chips, Sips with Meds  Diet effective now                     Antimicrobial agents: Anti-infectives (From admission, onward)    Start     Dose/Rate Route Frequency Ordered Stop   05/10/23 1000  piperacillin-tazobactam (ZOSYN) IVPB 3.375 g        3.375 g 12.5 mL/hr over 240 Minutes Intravenous Every 8 hours 05/10/23 0914     05/03/23 1400  piperacillin-tazobactam (ZOSYN) IVPB 3.375 g        3.375 g 12.5 mL/hr over 240 Minutes Intravenous Every 8 hours 05/03/23 0935 05/07/23 0124   04/27/23 1021  sodium chloride 0.9 % with cefoTEtan  (CEFOTAN) ADS Med       Note to Pharmacy: Shanda Bumps M: cabinet override      04/27/23 1021 04/27/23 1253   04/26/23 0800  cefoTEtan (CEFOTAN) 2 g in sodium chloride 0.9 % 100 mL IVPB  Status:  Discontinued        2 g 200 mL/hr over 30 Minutes Intravenous To [facility] 04/23/23 2111 04/27/23 0800   04/22/23 2300  piperacillin-tazobactam (ZOSYN) IVPB 3.375 g        3.375 g 12.5 mL/hr over 240 Minutes Intravenous Every 8 hours 04/22/23 2256 05/02/23 2359   04/22/23 1900  piperacillin-tazobactam (ZOSYN) IVPB 3.375 g        3.375 g 100 mL/hr over 30 Minutes Intravenous  Once 04/22/23 1852 04/22/23 1954        MEDICATIONS: Scheduled Meds:  acetaminophen  650 mg Oral Q6H   Chlorhexidine Gluconate Cloth  6 each Topical Daily   dofetilide  250 mcg Oral Q12H   feeding supplement  237 mL Oral TID BM   fluticasone furoate-vilanterol  1 puff Inhalation Daily   gabapentin  200 mg Oral QHS   insulin aspart  0-9 Units Subcutaneous Q8H   lidocaine  1 patch Transdermal Q24H   magic mouthwash  5 mL Oral QID   methocarbamol  500 mg Oral Q6H   metoCLOPramide (REGLAN) injection  10 mg Intravenous  Q6H   mouth rinse  15 mL Mouth Rinse 4 times per day   pantoprazole (PROTONIX) IV  40 mg Intravenous Q12H   revefenacin  175 mcg Nebulization Daily   spironolactone  25 mg Oral Daily   torsemide  40 mg Oral Daily   Continuous Infusions:  TPN (CLINIMIX-E) Adult     And   fat emul(SMOFlipid)     heparin 1,900 Units/hr (05/14/23 0902)   magnesium sulfate bolus IVPB 2 g (05/14/23 1318)   piperacillin-tazobactam (ZOSYN)  IV 12.5 mL/hr at 05/14/23 1200   promethazine (PHENERGAN) injection (IM or IVPB) Stopped (05/11/23 1422)   TPN (CLINIMIX-E) Adult 42 mL/hr at 05/14/23 1200   PRN Meds:.albuterol, guaiFENesin-dextromethorphan, HYDROmorphone (DILAUDID) injection, ondansetron (ZOFRAN) IV, promethazine (PHENERGAN) injection (IM or IVPB), sodium chloride flush   I have personally reviewed  following labs and imaging studies  LABORATORY DATA: CBC: Recent Labs  Lab 05/10/23 0226 05/11/23 0429 05/12/23 0342 05/13/23 0407 05/14/23 0838  WBC 15.1* 12.3* 8.9 8.1 8.0  HGB 9.0* 9.2* 8.3* 7.9* 7.4*  HCT 28.8* 28.8* 26.5* 26.4* 25.0*  MCV 84.7 83.0 84.1 85.7 85.0  PLT 309 292 251 233 204    Basic Metabolic Panel: Recent Labs  Lab 05/08/23 0334 05/09/23 0245 05/10/23 0226 05/11/23 0429 05/12/23 0342 05/13/23 0407 05/14/23 0847  NA 134*   < > 135 137 135 131* 133*  K 4.1   < > 4.1 3.6 3.4* 3.6 4.3  CL 111   < > 111 110 107 102 104  CO2 17*   < > 17* 18* 22 21* 21*  GLUCOSE 170*   < > 153* 174* 150* 150* 158*  BUN 22   < > 30* 22 15 13 13   CREATININE 0.47   < > 0.46 0.54 0.47 0.46 0.47  CALCIUM 8.4*   < > 8.7* 8.5* 7.9* 8.1* 8.7*  MG 2.1  --  2.3  --   --  1.9 1.7  PHOS 2.7  --  3.4  --   --  2.6  --    < > = values in this interval not displayed.    GFR: Estimated Creatinine Clearance: 59.3 mL/min (by C-G formula based on SCr of 0.47 mg/dL).  Liver Function Tests: Recent Labs  Lab 05/13/23 0407  AST 12*  ALT 13  ALKPHOS 66  BILITOT 0.5  PROT 5.0*  ALBUMIN 2.2*   No results for input(s): "LIPASE", "AMYLASE" in the last 168 hours. No results for input(s): "AMMONIA" in the last 168 hours.  Coagulation Profile: No results for input(s): "INR", "PROTIME" in the last 168 hours.  Cardiac Enzymes: No results for input(s): "CKTOTAL", "CKMB", "CKMBINDEX", "TROPONINI" in the last 168 hours.  BNP (last 3 results) No results for input(s): "PROBNP" in the last 8760 hours.  Lipid Profile: No results for input(s): "CHOL", "HDL", "LDLCALC", "TRIG", "CHOLHDL", "LDLDIRECT" in the last 72 hours.    Thyroid Function Tests: No results for input(s): "TSH", "T4TOTAL", "FREET4", "T3FREE", "THYROIDAB" in the last 72 hours.  Anemia Panel: No results for input(s): "VITAMINB12", "FOLATE", "FERRITIN", "TIBC", "IRON", "RETICCTPCT" in the last 72 hours.  Urine  analysis:    Component Value Date/Time   COLORURINE AMBER (A) 04/23/2023 0623   APPEARANCEUR CLOUDY (A) 04/23/2023 0623   APPEARANCEUR Hazy 12/25/2012 1653   LABSPEC 1.024 04/23/2023 0623   LABSPEC 1.021 12/25/2012 1653   PHURINE 5.0 04/23/2023 0623   GLUCOSEU NEGATIVE 04/23/2023 0623   GLUCOSEU NEGATIVE 03/17/2023 1003   HGBUR MODERATE (A) 04/23/2023  0623   BILIRUBINUR NEGATIVE 04/23/2023 0623   BILIRUBINUR Negative 12/25/2012 1653   KETONESUR NEGATIVE 04/23/2023 0623   PROTEINUR 30 (A) 04/23/2023 0623   UROBILINOGEN 0.2 03/17/2023 1003   NITRITE NEGATIVE 04/23/2023 0623   LEUKOCYTESUR LARGE (A) 04/23/2023 0623   LEUKOCYTESUR Negative 12/25/2012 1653    Sepsis Labs: Lactic Acid, Venous    Component Value Date/Time   LATICACIDVEN 0.8 04/23/2023 0114    MICROBIOLOGY: No results found for this or any previous visit (from the past 240 hour(s)).   RADIOLOGY STUDIES/RESULTS: DG Abd Portable 1V  Result Date: 05/14/2023 CLINICAL DATA:  77 year old female with abdominal pain and vomiting. Status post abdominal surgery, descending colostomy. EXAM: PORTABLE ABDOMEN - 1 VIEW COMPARISON:  Portable chest and CT Abdomen and Pelvis 05/09/2023. FINDINGS: Portable AP supine views at 0330 hours. Stable lung bases. Cardiac pacemaker leads again noted. Retrocardiac hypo ventilation on going. Enteric tube has been removed since 05/09/2023. Gas-filled small and large bowel loops (transverse colon). The small bowel is less dilated compared to 05/09/2023, but not yet normal. Similar volume of gas in the stomach. Left lower quadrant ostomy is evident. Abdominal drain remains in place across the lower abdomen and pelvis. No pneumoperitoneum is apparent on this supine view. Stable cholecystectomy clips. Stable visualized osseous structures. IMPRESSION: 1. Improved but not normalized bowel gas pattern since 05/09/2023. Pattern most resembles ileus. 2. Lower abdominal, enteric tube removed. Lower abdominal,  pelvic drain remains in place. 3. Left lung base hypo ventilation. Electronically Signed   By: Odessa Fleming M.D.   On: 05/14/2023 04:27     LOS: 22 days   Jeoffrey Massed, MD  [facility]    To contact the attending provider between 7A-7P or the covering provider during after hours 7P-7A, please log into the web site [email or web address] and access using universal [place] password for that web site. If you do not have the password, please call the hospital operator.  05/14/2023, 2:02 PM

## 2023-05-15 DIAGNOSIS — K5720 Diverticulitis of large intestine with perforation and abscess without bleeding: Secondary | ICD-10-CM | POA: Diagnosis not present

## 2023-05-15 DIAGNOSIS — E440 Moderate protein-calorie malnutrition: Secondary | ICD-10-CM | POA: Diagnosis not present

## 2023-05-15 DIAGNOSIS — E039 Hypothyroidism, unspecified: Secondary | ICD-10-CM | POA: Diagnosis not present

## 2023-05-15 DIAGNOSIS — E1140 Type 2 diabetes mellitus with diabetic neuropathy, unspecified: Secondary | ICD-10-CM | POA: Diagnosis not present

## 2023-05-15 LAB — CBC
HCT: 24.7 % — ABNORMAL LOW (ref 36.0–46.0)
Hemoglobin: 7.5 g/dL — ABNORMAL LOW (ref 12.0–15.0)
MCH: 26 pg (ref 26.0–34.0)
MCHC: 30.4 g/dL (ref 30.0–36.0)
MCV: 85.5 fL (ref 80.0–100.0)
Platelets: 216 10*3/uL (ref 150–400)
RBC: 2.89 MIL/uL — ABNORMAL LOW (ref 3.87–5.11)
RDW: 17.9 % — ABNORMAL HIGH (ref 11.5–15.5)
WBC: 7.9 10*3/uL (ref 4.0–10.5)
nRBC: 0.5 % — ABNORMAL HIGH (ref 0.0–0.2)

## 2023-05-15 LAB — GLUCOSE, CAPILLARY: Glucose-Capillary: 160 mg/dL — ABNORMAL HIGH (ref 70–99)

## 2023-05-15 LAB — HEPARIN LEVEL (UNFRACTIONATED): Heparin Unfractionated: 0.37 [IU]/mL (ref 0.30–0.70)

## 2023-05-15 MED ORDER — TRACE MINERALS CU-MN-SE-ZN 300-55-60-3000 MCG/ML IV SOLN
INTRAVENOUS | Status: AC
  Filled 2023-05-15 (×2): qty 1000

## 2023-05-15 MED ORDER — FAT EMUL FISH OIL/PLANT BASED 20% (SMOFLIPID)IV EMUL
250.0000 mL | INTRAVENOUS | Status: AC
  Administered 2023-05-15: 250 mL via INTRAVENOUS
  Filled 2023-05-15: qty 250

## 2023-05-15 NOTE — Progress Notes (Signed)
PHARMACY - TOTAL PARENTERAL NUTRITION CONSULT NOTE  Indication: Prolonged ileus  Patient Measurements: Height: 5' (152.4 cm) Weight: 88.8 kg (195 lb 12.3 oz) IBW/kg (Calculated) : 45.5 TPN AdjBW (KG): 54 Body mass index is 38.23 kg/m. Weight trends: 79.3kg (11/21) >>82.9 kg (11/30)  Assessment:  59 YOF with recurrent sigmoid diverticulitis and associated colovaginal fistula, HFrEF, COPD, Afib with PPM, and gout who was admitted with persistent symptoms with multiple abscesses s/p ex lap with sigmoid colectomy and end left colostomy on 11/19. Patient has been NPO since surgery. Pharmacy consulted to manage TPN.  Glucose / Insulin:  BG < 180, Used 4 units SSI/24 hours  -hx T2DM, A1c 6.2%; no meds PTA   Electrolytes: (last labs 12/6) Na 133, K  4.3, CO2 21 stable, Mg 1.7 >> gave 2g IV Renal: SCr 0.47, BUN wnl Hepatic: LFTs / tbili WNL, TG slightly elevated at 154, albumin 2.2 Intake / Output; MIVF: metoclopramide 12/2>> 12/8; UOP 0.7 ml/kg/hr on torsemide 40mg  po. JP drain 0mL, colostomy  GI Imaging: 11/21 KUB moderately dilated loops of small bowel, c/w post-operative ileus 11/23 KUB: persistent SBO or ileus, no significant change from prior 11/25 KUB: bowel obstruction or ileus, pleural effusion vs atelectasis 11/25 CTAP: post-op ileus or distal SBO, trace L pleural effusion 11/26 CXR: L > R lower lobe atelectasis 11/29 Abd XR: distention of SB concerning for obstruction or worsening postop ileus 12/1 CT: mild ileus 12/6 KUB: improved but not normalized ileus-like bowel gas pattern GI Surgeries / Procedures:  11/19 ex lap with sigmoid colectomy and end left colostomy  Central access: PICC placed 04/30/23 TPN start date: 04/30/23  Nutritional Goals: Clinimix E 8/10 1L at 42 ml/hr with SMOF lipids on MTWThFSa Clinimix E 8/10 2L at 82 ml/hr on Sun with SMOFlipids - Average of 1258 kCals (~80% of goal) and 91g AA per day (100% goal) Previously on D10 fluids for add'l kCal >>  stopped on 11/26 given concerns for pleural effusions/atelectasis noted on imaging, weights increased requiring Lasix IV - this has resolved so restarted D10 at 11/29, then changed to sodium bicarb D5 gtt   RD Estimated Needs Total Energy Estimated Needs: 1600-1800 kcal Total Protein Estimated Needs: 75-95 gm Total Fluid Estimated Needs: >1.6L  Current Nutrition:  TPN 12/5 CLD with worsening abd pain and N/V 12/6 NPO: bloated, + flatus; restart metoclopramide   Plan:  Clinimix E 8/10 1L at 42 mL/hr on [id number] Clinimix E 8/10 2L at 82 mL/hr on Sun SMOFlipids over 12 hrs daily  TPN providing an average of 1258 kCals (~80% of goal) and 91g AA per day (100% goal) Electrolytes in Clinimix E 8/10 1L bag: Na 35 mEq, K 30 mEq, Mag 5 mEq, Ca 4.5 mEq, Phos , Acetate 83, Cl 76 mEq Electrolytes in Clinimix E 8/10 2L bag: Na 69 mEq, K 59 mEq, Mag 9.8 mEq, Ca 8.9 mEq, Phos 29.5 mmol,Cl:Acet 1:2 Continue standard MVI and trace elements to TPN Stop SSI and BG checks  Monitor TPN labs Mon/Thur and PRN  F/U diet advancement, wean off TPN as able  Thank you for involving pharmacy in this patient's care.  Kathleen [name], PharmD, BCPS, BCCP Clinical Pharmacist  Please check AMION for all [facility] phone numbers After 10:00 PM, call Main Pharmacy [phone]

## 2023-05-15 NOTE — Progress Notes (Signed)
PROGRESS NOTE        PATIENT DETAILS Name: Kathleen [name] Age: 77 y.o. Sex: female Date of Birth: Nov 14, 1945 Admit Date: 04/22/2023 Admitting Physician Gery Pray, MD QMV:HQION, Bernadene Bell, MD  Brief Summary:  Patient is a 77 y.o.  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  Admitted by [facility]-started on empiric IV antibiotics-underwent preop assessment by cardiology-and then subsequently underwent  exploratory laparotomy with sigmoid colectomy and end ostomy on 11/19.  Postoperative course complicated by ileus-requiring NG tube decompression-and TNA for nutrition.  See below for further details.  Significant events: 11/14>> admit to [facility] 11/19>>exploratory laparotomy with sigmoid colectomy and end ostomy  12/04>> NG removed-clear liquid started  Significant studies: 11/14>> CT abdomen/pelvis: Worsening signal right diverticulitis-localized perforation-5.6 cm perirectal abscess, 6.0 cm left lower quadrant abscess. 11/22>> x-ray abdomen: Consistent with postoperative ileus. 11/25>> CT abdomen/pelvis: No abscess 12/01>> CT abdomen/pelvis: Mild small bowel ileus-no obvious abscess-small fluid collection in the colonic stump  Significant microbiology data: 11/14>> blood culture: No growth 11/14>> COVID/influenza/RSV PCR: Negative  Procedures: 11/19>>exploratory laparotomy with sigmoid colectomy  end ostomy on 11/19  Consults: Cardiology GI General surgery  Subjective: No vomiting overnight-some right-sided abdominal pain continues.  Objective: Vitals: Blood pressure (!) 109/57, pulse 70, temperature (!) 97.5 F (36.4 C), temperature source Oral, resp. rate 17, height 5' (1.524 m), weight 86.5 kg, SpO2 100%.   Exam: Gen Exam:Alert awake-not in  any distress HEENT:atraumatic, normocephalic Chest: B/L clear to auscultation anteriorly CVS:S1S2 regular Abdomen:soft slightly tender in the right mid abdominal area-no peritoneal signs. 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 Dark brown stools in ostomy Remains on TNA Empiric Zosyn restarted on 12/2 for purulent drainage Continues to have some pain in the right mid central abdominal area-if it continues will require repeat imaging. General Surgery continues to follow  Hypokalemia/hypophosphatemia  Replete per pharmacy protocol.  Normocytic anemia Secondary to critical illness-some minimal amount of blood seen on the ostomy-but doubt any active or chronic bleeding (chronic blood loss ruled out) Follow CBC and transfuse accordingly.  Persistent atrial fibrillation Telemetry monitoring Tikosyn orally IV heparin-probably could be switched to Eliquis in the next day or so. 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  GERD PPI  DM-2 (A1c 6.2 on 11/15) CBGs stable on SSI  Recent Labs    05/14/23 1927 05/14/23 2351 05/15/23 0350  GLUCAP 146* 156* 160*   Nutrition Status: Nutrition Problem: Moderate Malnutrition Etiology: chronic illness Signs/Symptoms: mild fat depletion, mild muscle depletion, energy intake < 75% for > 7 days Interventions: Ensure Enlive (each supplement provides 350kcal and 20 grams of protein), Magic cup, MVI  Obesity: Estimated body mass index is 37.24 kg/m as calculated from the following:   Height as of this encounter: 5' (1.524 m).   Weight as of this encounter: 86.5 kg.   Code status:   Code Status: Limited: Do not attempt resuscitation (DNR) -DNR-LIMITED -Do Not Intubate/DNI    DVT Prophylaxis: IV heparin   Family Communication: None at bedside.   Disposition Plan: Status is: Inpatient Remains inpatient appropriate because: Severity of illness   Planned Discharge Destination:Skilled nursing facility   Diet: Diet Order             Diet full liquid Room service appropriate? Yes; Fluid consistency: Thin  Diet effective now                     Antimicrobial agents: Anti-infectives (From admission, onward)    Start     Dose/Rate Route Frequency Ordered Stop   05/10/23 1000  piperacillin-tazobactam (ZOSYN) IVPB 3.375 g        3.375 g 12.5 mL/hr over 240 Minutes Intravenous Every 8 hours 05/10/23 0914     05/03/23 1400  piperacillin-tazobactam (ZOSYN) IVPB 3.375 g        3.375 g 12.5 mL/hr over 240 Minutes Intravenous Every 8 hours 05/03/23 0935 05/07/23 0124   04/27/23 1021  sodium chloride 0.9 % with cefoTEtan (CEFOTAN) ADS Med       Note to Pharmacy: Shanda Bumps M: cabinet override      04/27/23 1021 04/27/23 1253   04/26/23 0800  cefoTEtan (CEFOTAN) 2 g in sodium chloride 0.9 % 100 mL IVPB  Status:  Discontinued        2 g 200 mL/hr over 30 Minutes Intravenous To ShortStay Surgical 04/23/23 2111 04/27/23 0800   04/22/23 2300  piperacillin-tazobactam (ZOSYN) IVPB 3.375 g        3.375 g 12.5 mL/hr over 240 Minutes Intravenous Every 8 hours 04/22/23 2256 05/02/23 2359   04/22/23 1900  piperacillin-tazobactam (ZOSYN) IVPB 3.375 g        3.375 g 100 mL/hr over 30 Minutes Intravenous  Once 04/22/23 1852 04/22/23 1954        MEDICATIONS: Scheduled Meds:  acetaminophen  650 mg Oral Q6H   Chlorhexidine Gluconate Cloth  6 each Topical Daily   dofetilide  250 mcg Oral Q12H   feeding supplement  237 mL Oral TID BM   fluticasone furoate-vilanterol  1 puff Inhalation Daily   gabapentin  200 mg Oral QHS   lidocaine  1 patch Transdermal Q24H   magic mouthwash  5 mL Oral QID   methocarbamol  500 mg Oral Q6H   metoCLOPramide (REGLAN) injection  10 mg Intravenous Q6H   mouth rinse  15 mL Mouth Rinse 4 times per day  pantoprazole (PROTONIX) IV  40 mg Intravenous Q12H   revefenacin  175 mcg Nebulization Daily   spironolactone  25 mg Oral Daily   torsemide  40 mg Oral Daily   Continuous Infusions:  TPN (CLINIMIX-E) Adult     And   fat emul(SMOFlipid)     heparin 1,900 Units/hr (05/15/23 1007)   piperacillin-tazobactam (ZOSYN)  IV 3.375 g (05/15/23 1007)   promethazine (PHENERGAN) injection (IM or IVPB) Stopped (05/11/23 1422)   TPN (CLINIMIX-E) Adult 42 mL/hr at 05/15/23 1007   PRN Meds:.albuterol, guaiFENesin-dextromethorphan, HYDROmorphone (DILAUDID) injection, ondansetron (ZOFRAN) IV, promethazine (PHENERGAN) injection (IM or IVPB), sodium chloride flush   I have personally reviewed following labs and imaging studies  LABORATORY DATA: CBC: Recent Labs  Lab 05/11/23 0429 05/12/23 0342 05/13/23 0407 05/14/23 0838 05/15/23 0409   WBC 12.3* 8.9 8.1 8.0 7.9  HGB 9.2* 8.3* 7.9* 7.4* 7.5*  HCT 28.8* 26.5* 26.4* 25.0* 24.7*  MCV 83.0 84.1 85.7 85.0 85.5  PLT 292 251 233 204 216    Basic Metabolic Panel: Recent Labs  Lab 05/10/23 0226 05/11/23 0429 05/12/23 0342 05/13/23 0407 05/14/23 0847  NA 135 137 135 131* 133*  K 4.1 3.6 3.4* 3.6 4.3  CL 111 110 107 102 104  CO2 17* 18* 22 21* 21*  GLUCOSE 153* 174* 150* 150* 158*  BUN 30* 22 15 13 13   CREATININE 0.46 0.54 0.47 0.46 0.47  CALCIUM 8.7* 8.5* 7.9* 8.1* 8.7*  MG 2.3  --   --  1.9 1.7  PHOS 3.4  --   --  2.6  --     GFR: Estimated Creatinine Clearance: 58.5 mL/min (by C-G formula based on SCr of 0.47 mg/dL).  Liver Function Tests: Recent Labs  Lab 05/13/23 0407  AST 12*  ALT 13  ALKPHOS 66  BILITOT 0.5  PROT 5.0*  ALBUMIN 2.2*   No results for input(s): "LIPASE", "AMYLASE" in the last 168 hours. No results for input(s): "AMMONIA" in the last 168 hours.  Coagulation Profile: No results for input(s): "INR", "PROTIME" in the last 168 hours.  Cardiac Enzymes: No results for input(s): "CKTOTAL", "CKMB", "CKMBINDEX", "TROPONINI" in the last 168 hours.  BNP (last 3 results) No results for input(s): "PROBNP" in the last 8760 hours.  Lipid Profile: No results for input(s): "CHOL", "HDL", "LDLCALC", "TRIG", "CHOLHDL", "LDLDIRECT" in the last 72 hours.    Thyroid Function Tests: No results for input(s): "TSH", "T4TOTAL", "FREET4", "T3FREE", "THYROIDAB" in the last 72 hours.  Anemia Panel: No results for input(s): "VITAMINB12", "FOLATE", "FERRITIN", "TIBC", "IRON", "RETICCTPCT" in the last 72 hours.  Urine analysis:    Component Value Date/Time   COLORURINE AMBER (A) 04/23/2023 0623   APPEARANCEUR CLOUDY (A) 04/23/2023 0623   APPEARANCEUR Hazy 12/25/2012 1653   LABSPEC 1.024 04/23/2023 0623   LABSPEC 1.021 12/25/2012 1653   PHURINE 5.0 04/23/2023 0623   GLUCOSEU NEGATIVE 04/23/2023 0623   GLUCOSEU NEGATIVE 03/17/2023 1003   HGBUR  MODERATE (A) 04/23/2023 0623   BILIRUBINUR NEGATIVE 04/23/2023 0623   BILIRUBINUR Negative 12/25/2012 1653   KETONESUR NEGATIVE 04/23/2023 0623   PROTEINUR 30 (A) 04/23/2023 0623   UROBILINOGEN 0.2 03/17/2023 1003   NITRITE NEGATIVE 04/23/2023 0623   LEUKOCYTESUR LARGE (A) 04/23/2023 0623   LEUKOCYTESUR Negative 12/25/2012 1653    Sepsis Labs: Lactic Acid, Venous    Component Value Date/Time   LATICACIDVEN 0.8 04/23/2023 0114    MICROBIOLOGY: No results found for this or any previous visit (from the past 240 hour(s)).  RADIOLOGY STUDIES/RESULTS: DG Abd Portable 1V  Result Date: 05/14/2023 CLINICAL DATA:  77 year old female with abdominal pain and vomiting. Status post abdominal surgery, descending colostomy. EXAM: PORTABLE ABDOMEN - 1 VIEW COMPARISON:  Portable chest and CT Abdomen and Pelvis 05/09/2023. FINDINGS: Portable AP supine views at 0330 hours. Stable lung bases. Cardiac pacemaker leads again noted. Retrocardiac hypo ventilation on going. Enteric tube has been removed since 05/09/2023. Gas-filled small and large bowel loops (transverse colon). The small bowel is less dilated compared to 05/09/2023, but not yet normal. Similar volume of gas in the stomach. Left lower quadrant ostomy is evident. Abdominal drain remains in place across the lower abdomen and pelvis. No pneumoperitoneum is apparent on this supine view. Stable cholecystectomy clips. Stable visualized osseous structures. IMPRESSION: 1. Improved but not normalized bowel gas pattern since 05/09/2023. Pattern most resembles ileus. 2. Lower abdominal, enteric tube removed. Lower abdominal, pelvic drain remains in place. 3. Left lung base hypo ventilation. Electronically Signed   By: Odessa Fleming M.D.   On: 05/14/2023 04:27     LOS: 23 days   Jeoffrey Massed, MD  [facility]    To contact the attending provider between 7A-7P or the covering provider during after hours 7P-7A, please log into the web site  [email or web address] and access using universal [place] password for that web site. If you do not have the password, please call the hospital operator.  05/15/2023, 10:23 AM

## 2023-05-15 NOTE — Progress Notes (Signed)
18 Days Post-Op   Subjective/Chief Complaint: Improved over yesterday Less abdominal pain No nausea or vomiting JP drains - minimal serous output Large amount of stool in ostomy bag   Objective: Vital signs in last 24 hours: Temp:  [97.4 F (36.3 C)-98.1 F (36.7 C)] 97.5 F (36.4 C) (12/07 0352) Pulse Rate:  [71-81] 72 (12/07 0910) Resp:  [14-18] 16 (12/07 0910) BP: (94-111)/(50-66) 94/50 (12/07 0352) SpO2:  [99 %-100 %] 100 % (12/07 0910) Weight:  [86.5 kg] 86.5 kg (12/07 0645) Last BM Date : 05/14/23  Intake/Output from previous day: 12/06 0701 - 12/07 0700 In: 1814.7 [P.O.:240; I.V.:1389; IV Piggyback:185.7] Out: 1030 [Urine:750; Drains:5; Stool:275] Intake/Output this shift: No intake/output data recorded.  Gen:  Alert, NAD Card:  RRR Pulm:  rate and effort normal on supplemental O2 via [place] Abd: soft, obese, mild generalized tenderness, open midline stable with few areas of breakdown and separation but fascia appears intact/ otherwise mostly healthy pink granulation tissue, Stoma viable; large amount of liquid stool in bag JP x2 - no significant output  Lab Results:  Recent Labs    05/14/23 0838 05/15/23 0409  WBC 8.0 7.9  HGB 7.4* 7.5*  HCT 25.0* 24.7*  PLT 204 216   BMET Recent Labs    05/13/23 0407 05/14/23 0847  NA 131* 133*  K 3.6 4.3  CL 102 104  CO2 21* 21*  GLUCOSE 150* 158*  BUN 13 13  CREATININE 0.46 0.47  CALCIUM 8.1* 8.7*   PT/INR No results for input(s): "LABPROT", "INR" in the last 72 hours. ABG No results for input(s): "PHART", "HCO3" in the last 72 hours.  Invalid input(s): "PCO2", "PO2"  Studies/Results: DG Abd Portable 1V  Result Date: 05/14/2023 CLINICAL DATA:  77 year old female with abdominal pain and vomiting. Status post abdominal surgery, descending colostomy. EXAM: PORTABLE ABDOMEN - 1 VIEW COMPARISON:  Portable chest and CT Abdomen and Pelvis 05/09/2023. FINDINGS: Portable AP supine views at 0330 hours. Stable lung  bases. Cardiac pacemaker leads again noted. Retrocardiac hypo ventilation on going. Enteric tube has been removed since 05/09/2023. Gas-filled small and large bowel loops (transverse colon). The small bowel is less dilated compared to 05/09/2023, but not yet normal. Similar volume of gas in the stomach. Left lower quadrant ostomy is evident. Abdominal drain remains in place across the lower abdomen and pelvis. No pneumoperitoneum is apparent on this supine view. Stable cholecystectomy clips. Stable visualized osseous structures. IMPRESSION: 1. Improved but not normalized bowel gas pattern since 05/09/2023. Pattern most resembles ileus. 2. Lower abdominal, enteric tube removed. Lower abdominal, pelvic drain remains in place. 3. Left lung base hypo ventilation. Electronically Signed   By: Odessa Fleming M.D.   On: 05/14/2023 04:27    Anti-infectives: Anti-infectives (From admission, onward)    Start     Dose/Rate Route Frequency Ordered Stop   05/10/23 1000  piperacillin-tazobactam (ZOSYN) IVPB 3.375 g        3.375 g 12.5 mL/hr over 240 Minutes Intravenous Every 8 hours 05/10/23 0914     05/03/23 1400  piperacillin-tazobactam (ZOSYN) IVPB 3.375 g        3.375 g 12.5 mL/hr over 240 Minutes Intravenous Every 8 hours 05/03/23 0935 05/07/23 0124   04/27/23 1021  sodium chloride 0.9 % with cefoTEtan (CEFOTAN) ADS Med       Note to Pharmacy: Shanda Bumps M: cabinet override      04/27/23 1021 04/27/23 1253   04/26/23 0800  cefoTEtan (CEFOTAN) 2 g in sodium chloride 0.9 %  100 mL IVPB  Status:  Discontinued        2 g 200 mL/hr over 30 Minutes Intravenous To ShortStay Surgical 04/23/23 2111 04/27/23 0800   04/22/23 2300  piperacillin-tazobactam (ZOSYN) IVPB 3.375 g        3.375 g 12.5 mL/hr over 240 Minutes Intravenous Every 8 hours 04/22/23 2256 05/02/23 2359   04/22/23 1900  piperacillin-tazobactam (ZOSYN) IVPB 3.375 g        3.375 g 100 mL/hr over 30 Minutes Intravenous  Once 04/22/23 1852 04/22/23 1954        Assessment/Plan: Exploratory laparotomy, sigmoid colectomy, colostomy 11/19/24Dwain Sarna - CT 12/1 ileus, small fluid collection near colonic stump 3.5x1.6cm.  Zosyn restarted 12/2. JP x2 were purulent appearing but now with minimal serous output  - Improved bowel function - full liquids -  Continue TPN until we are sure she is tolerating PO's.  - WOC following for new ostomy. Stoma with some mucocutaneous separation, monitor. Will need ostomy clinic referral at discharge  - vac unable to hold seal and was removed, continue BID wet to dry dressing changes - oob, pulm toilet   ID zosyn 11/14>>11/29, 12/2>> VTE - heparin heparin gtt for afib  FEN - TPN, ice chips  LOS: 23 days    Kathleen [name] 05/15/2023

## 2023-05-15 NOTE — Progress Notes (Signed)
PHARMACY - ANTICOAGULATION CONSULT NOTE  Pharmacy Consult for Heparin gtt Indication: atrial fibrillation  Patient Measurements: Height: 5' (152.4 cm) Weight: 86.5 kg (190 lb 11.2 oz) IBW/kg (Calculated) : 45.5 Heparin Dosing Weight: 64 kg  Vital Signs: Temp: 97.5 F (36.4 C) (12/07 0352) Temp Source: Oral (12/07 0352) BP: 94/50 (12/07 0352) Pulse Rate: 71 (12/07 0352)  Labs: Recent Labs    05/13/23 0407 05/14/23 0219 05/14/23 0838 05/14/23 0847 05/15/23 0409  HGB 7.9*  --  7.4*  --  7.5*  HCT 26.4*  --  25.0*  --  24.7*  PLT 233  --  204  --  216  HEPARINUNFRC 0.48 0.46  --   --  0.37  CREATININE 0.46  --   --  0.47  --    Estimated Creatinine Clearance: 58.5 mL/min (by C-G formula based on SCr of 0.47 mg/dL).  Assessment: [name] with history of atrial fibrillation on Xarelto prior to admission. Last dose of Xarelto 04/21/23 at 1900. Admitted for recurrent diverticulitis, perforation, and perirectal abscess. 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 Cleared by surgery to start hep gtt for afib.  05/15/23:  Heparin remains therapeutic at 0.37 on 1900 units/hr. Hgb low/stable at 7.5, PLT stable at 216. No bleeding noted.   Goal of Therapy:  Heparin level 0.3-0.7 units/ml Monitor platelets by anticoagulation protocol: Yes   Plan:  No bolus per surgery Continue heparin at 1900 units/hr Heparin level/CBC daily Monitor for signs/symptoms of bleeding  Thank you for allowing pharmacy to be part of this patients care team. Noah Delaine, RPh Clinical Pharmacist 05/15/2023 8:38 AM  Please check AMION for all [facility] Pharmacy phone numbers After 10:00 PM, call Main Pharmacy [phone]

## 2023-05-15 NOTE — Plan of Care (Signed)
Pt has rested quietly throughout the night with no distress noted. Alert and oriented. On O23LNC. V-paced on the monitor. 2 Jp's intact. Dressing to midline abd incision done as ordered. RUA PICC intact with dressing CDI. TPN infusing with lipids. Heparin drip infusing. Colostomy intact brown stool. Pt medicated twice for pain with relief noted. No other complaints voiced.     Problem: Metabolic: Goal: Ability to maintain appropriate glucose levels will improve Outcome: Progressing   Problem: Skin Integrity: Goal: Risk for impaired skin integrity will decrease Outcome: Progressing   Problem: Education: Goal: Knowledge of General Education information will improve Description: Including pain rating scale, medication(s)/side effects and non-pharmacologic comfort measures Outcome: Progressing   Problem: Clinical Measurements: Goal: Ability to maintain clinical measurements within normal limits will improve Outcome: Progressing   Problem: Activity: Goal: Risk for activity intolerance will decrease Outcome: Progressing   Problem: Coping: Goal: Level of anxiety will decrease Outcome: Progressing

## 2023-05-16 ENCOUNTER — Inpatient Hospital Stay (HOSPITAL_COMMUNITY): Payer: Medicare Other

## 2023-05-16 DIAGNOSIS — E440 Moderate protein-calorie malnutrition: Secondary | ICD-10-CM | POA: Diagnosis not present

## 2023-05-16 DIAGNOSIS — E1140 Type 2 diabetes mellitus with diabetic neuropathy, unspecified: Secondary | ICD-10-CM | POA: Diagnosis not present

## 2023-05-16 DIAGNOSIS — K5720 Diverticulitis of large intestine with perforation and abscess without bleeding: Secondary | ICD-10-CM | POA: Diagnosis not present

## 2023-05-16 DIAGNOSIS — E039 Hypothyroidism, unspecified: Secondary | ICD-10-CM | POA: Diagnosis not present

## 2023-05-16 LAB — COMPREHENSIVE METABOLIC PANEL
ALT: 11 U/L (ref 0–44)
AST: 12 U/L — ABNORMAL LOW (ref 15–41)
Albumin: 2.4 g/dL — ABNORMAL LOW (ref 3.5–5.0)
Alkaline Phosphatase: 58 U/L (ref 38–126)
Anion gap: 11 (ref 5–15)
BUN: 21 mg/dL (ref 8–23)
CO2: 23 mmol/L (ref 22–32)
Calcium: 8.8 mg/dL — ABNORMAL LOW (ref 8.9–10.3)
Chloride: 97 mmol/L — ABNORMAL LOW (ref 98–111)
Creatinine, Ser: 0.63 mg/dL (ref 0.44–1.00)
GFR, Estimated: >60 mL/min (ref >60)
Glucose, Bld: 166 mg/dL — ABNORMAL HIGH (ref 70–99)
Potassium: 3.6 mmol/L (ref 3.5–5.1)
Sodium: 131 mmol/L — ABNORMAL LOW (ref 135–145)
Total Bilirubin: 0.6 mg/dL (ref <1.2)
Total Protein: 5.5 g/dL — ABNORMAL LOW (ref 6.5–8.1)

## 2023-05-16 LAB — CBC
HCT: 24.6 % — ABNORMAL LOW (ref 36.0–46.0)
Hemoglobin: 7.5 g/dL — ABNORMAL LOW (ref 12.0–15.0)
MCH: 26.2 pg (ref 26.0–34.0)
MCHC: 30.5 g/dL (ref 30.0–36.0)
MCV: 86 fL (ref 80.0–100.0)
Platelets: 217 10*3/uL (ref 150–400)
RBC: 2.86 MIL/uL — ABNORMAL LOW (ref 3.87–5.11)
RDW: 18 % — ABNORMAL HIGH (ref 11.5–15.5)
WBC: 8.2 10*3/uL (ref 4.0–10.5)
nRBC: 0.4 % — ABNORMAL HIGH (ref 0.0–0.2)

## 2023-05-16 LAB — HEPARIN LEVEL (UNFRACTIONATED): Heparin Unfractionated: 0.47 [IU]/mL (ref 0.30–0.70)

## 2023-05-16 MED ORDER — TRACE MINERALS CU-MN-SE-ZN 300-55-60-3000 MCG/ML IV SOLN
INTRAVENOUS | Status: AC
  Filled 2023-05-16: qty 2000

## 2023-05-16 MED ORDER — BOOST / RESOURCE BREEZE PO LIQD CUSTOM
1.0000 | Freq: Three times a day (TID) | ORAL | Status: DC
  Filled 2023-05-16 (×3): qty 1

## 2023-05-16 MED ORDER — PROSOURCE PLUS PO LIQD
30.0000 mL | Freq: Two times a day (BID) | ORAL | Status: DC
  Administered 2023-05-16 – 2023-05-27 (×19): 30 mL via ORAL
  Filled 2023-05-16 (×16): qty 30

## 2023-05-16 MED ORDER — FAT EMUL FISH OIL/PLANT BASED 20% (SMOFLIPID)IV EMUL
250.0000 mL | INTRAVENOUS | Status: AC
  Administered 2023-05-16: 250 mL via INTRAVENOUS
  Filled 2023-05-16: qty 250

## 2023-05-16 MED ORDER — ENSURE MAX PROTEIN PO LIQD
11.0000 [oz_av] | Freq: Two times a day (BID) | ORAL | Status: DC
  Administered 2023-05-17 – 2023-05-19 (×4): 11 [oz_av] via ORAL
  Filled 2023-05-16 (×7): qty 330

## 2023-05-16 MED ORDER — IOHEXOL 350 MG/ML SOLN
75.0000 mL | Freq: Once | INTRAVENOUS | Status: AC | PRN
  Administered 2023-05-16: 75 mL via INTRAVENOUS

## 2023-05-16 NOTE — Plan of Care (Signed)
Pt has rested quietly throughout the night with no distress noted. Alert and oriented. On O2 3LNC. Paced on the monitor. Right UA PICC intact with dressing CDI. TPN continues. Up to [facility] to void. Colostomy intact. Pt medicated for pain with dilaudid 3 times with relief noted. Pt concerned for all the pain not getting better. MD notified. New orders noted. To go to CT. No other complaints voiced.     Problem: Nutritional: Goal: Maintenance of adequate nutrition will improve Outcome: Progressing   Problem: Skin Integrity: Goal: Risk for impaired skin integrity will decrease Outcome: Progressing   Problem: Education: Goal: Knowledge of General Education information will improve Description: Including pain rating scale, medication(s)/side effects and non-pharmacologic comfort measures Outcome: Progressing   Problem: Coping: Goal: Level of anxiety will decrease Outcome: Progressing   Problem: Pain Management: Goal: General experience of comfort will improve Outcome: Progressing

## 2023-05-16 NOTE — Progress Notes (Signed)
PROGRESS NOTE        PATIENT DETAILS Name: Kathleen [name] Age: 77 y.o. Sex: female Date of Birth: [birth year] Admit Date: 04/22/2023 Admitting Physician Gery Pray, MD UEA:[id number], Bernadene Bell, MD  Brief Summary:  Patient is a 77 y.o.  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  Admitted by [facility]-started on empiric IV antibiotics-underwent preop assessment by cardiology-and then subsequently underwent  exploratory laparotomy with sigmoid colectomy and end ostomy on 11/19.  Postoperative course complicated by ileus-requiring NG tube decompression-and TNA for nutrition.  See below for further details.  Significant events: 11/14>> admit to [facility] 11/19>>exploratory laparotomy with sigmoid colectomy and end ostomy  12/04>> NG removed-clear liquid started  Significant studies: 11/14>> CT abdomen/pelvis: Worsening signal right diverticulitis-localized perforation-5.6 cm perirectal abscess, 6.0 cm left lower quadrant abscess. 11/22>> x-ray abdomen: Consistent with postoperative ileus. 11/25>> CT abdomen/pelvis: No abscess 12/01>> CT abdomen/pelvis: Mild small bowel ileus-no obvious abscess-small fluid collection in the colonic stump  Significant microbiology data: 11/14>> blood culture: No growth 11/14>> COVID/influenza/RSV PCR: Negative  Procedures: 11/19>>exploratory laparotomy with sigmoid colectomy  end ostomy on 11/19  Consults: Cardiology GI General surgery  Subjective: More abdominal pain overnight-awaiting CT abdomen.  Tolerating liquids-stools in ostomy bag.  Nonbloody stools.  Objective: Vitals: Blood pressure (!) 130/59, pulse 74, temperature (!) 97.4 F (36.3 C), temperature source Oral, resp. rate 16, height 5' (1.524 m), weight 86.5 kg, SpO2  100%.   Exam: Gen Exam:Alert awake-not in any distress HEENT:atraumatic, normocephalic Chest: B/L clear to auscultation anteriorly CVS:S1S2 regular Abdomen:soft-tender in the right mid abdominal area-no peritoneal signs.  Dark brown stools in ostomy. 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  Remains on TNA Empiric Zosyn restarted on 12/2 for purulent drainage General Surgery continues to follow  Hypokalemia/hypophosphatemia  Continue to replete  Normocytic anemia Secondary to critical illness No evidence of blood loss-Brown/dark stools in ostomy Follow CBC.  Persistent atrial fibrillation Telemetry monitoring Tikosyn orally IV heparin-probably could be switched to Eliquis in the next day or so. Attempt to keep K> 4  History of AV nodal ablation-s/p PPM ([employer]) Telemetry monitoring  Chronic HFrEF (EF 35-40% on 4/9 by TTE) Volume status reasonably stable but some  leg edema today Lasix/Aldactone   Chronic hypoxic respiratory failure on home  O2 COPD BOOP Stable Pulmonary tolerating with incentive spirometry/flutter valve Mobilization with PT/OT Continue bronchodilators Incentive spirometry/flutter valve Out of bed to chair.  GERD PPI  DM-2 (A1c 6.2 on 11/15) CBGs stable on SSI  Recent Labs    05/14/23 1927 05/14/23 2351 05/15/23 0350  GLUCAP 146* 156* 160*   Nutrition Status: Nutrition Problem: Moderate Malnutrition Etiology: chronic illness Signs/Symptoms: mild fat depletion, mild muscle depletion, energy intake < 75% for > 7 days Interventions: Ensure Enlive (each supplement provides 350kcal and 20 grams of protein), Magic cup, MVI  Obesity: Estimated body mass index is 37.24 kg/m as calculated from the following:   Height as of this encounter: 5' (1.524 m).   Weight as of this encounter: 86.5 kg.   Code status:   Code Status: Limited: Do not attempt resuscitation (DNR) -DNR-LIMITED -Do Not Intubate/DNI    DVT Prophylaxis: IV heparin   Family Communication: None at bedside.   Disposition Plan: Status is: Inpatient Remains inpatient appropriate because: Severity of illness   Planned Discharge Destination:Skilled nursing facility   Diet: Diet Order             Diet full liquid Room service appropriate? Yes; Fluid consistency: Thin  Diet effective now                     Antimicrobial agents: Anti-infectives (From admission, onward)    Start     Dose/Rate Route Frequency Ordered Stop   05/10/23 1000  piperacillin-tazobactam (ZOSYN) IVPB 3.375 g        3.375 g 12.5 mL/hr over 240 Minutes Intravenous Every 8 hours 05/10/23 0914     05/03/23 1400  piperacillin-tazobactam (ZOSYN) IVPB 3.375 g        3.375 g 12.5 mL/hr over 240 Minutes Intravenous Every 8 hours 05/03/23 0935 05/07/23 0124   04/27/23 1021  sodium chloride 0.9 % with cefoTEtan (CEFOTAN) ADS Med       Note to Pharmacy: Shanda Bumps M: cabinet override      04/27/23 1021 04/27/23 1253   04/26/23 0800  cefoTEtan  (CEFOTAN) 2 g in sodium chloride 0.9 % 100 mL IVPB  Status:  Discontinued        2 g 200 mL/hr over 30 Minutes Intravenous To ShortStay Surgical 04/23/23 2111 04/27/23 0800   04/22/23 2300  piperacillin-tazobactam (ZOSYN) IVPB 3.375 g        3.375 g 12.5 mL/hr over 240 Minutes Intravenous Every 8 hours 04/22/23 2256 05/02/23 2359   04/22/23 1900  piperacillin-tazobactam (ZOSYN) IVPB 3.375 g        3.375 g 100 mL/hr over 30 Minutes Intravenous  Once 04/22/23 1852 04/22/23 1954        MEDICATIONS: Scheduled Meds:  (feeding supplement) PROSource Plus  30 mL Oral BID BM   acetaminophen  650 mg Oral Q6H   Chlorhexidine Gluconate Cloth  6 each Topical Daily   dofetilide  250 mcg Oral Q12H   feeding supplement  1 Container Oral TID BM   feeding supplement  237 mL Oral TID BM   fluticasone furoate-vilanterol  1 puff Inhalation Daily   gabapentin  200 mg Oral QHS   lidocaine  1 patch Transdermal Q24H   magic mouthwash  5 mL Oral QID   methocarbamol  500 mg Oral Q6H   mouth rinse  15 mL Mouth Rinse 4 times per day   pantoprazole (PROTONIX) IV  40  mg Intravenous Q12H   revefenacin  175 mcg Nebulization Daily   spironolactone  25 mg Oral Daily   torsemide  40 mg Oral Daily   Continuous Infusions:  TPN (CLINIMIX-E) Adult     And   fat emul(SMOFlipid)     heparin Stopped (05/16/23 0602)   piperacillin-tazobactam (ZOSYN)  IV 3.375 g (05/16/23 0853)   promethazine (PHENERGAN) injection (IM or IVPB) Stopped (05/11/23 1422)   TPN (CLINIMIX-E) Adult 42 mL/hr at 05/16/23 0700   PRN Meds:.albuterol, guaiFENesin-dextromethorphan, HYDROmorphone (DILAUDID) injection, ondansetron (ZOFRAN) IV, promethazine (PHENERGAN) injection (IM or IVPB), sodium chloride flush   I have personally reviewed following labs and imaging studies  LABORATORY DATA: CBC: Recent Labs  Lab 05/12/23 0342 05/13/23 0407 05/14/23 0838 05/15/23 0409 05/16/23 0227  WBC 8.9 8.1 8.0 7.9 8.2  HGB 8.3* 7.9* 7.4* 7.5*  7.5*  HCT 26.5* 26.4* 25.0* 24.7* 24.6*  MCV 84.1 85.7 85.0 85.5 86.0  PLT 251 233 204 216 217    Basic Metabolic Panel: Recent Labs  Lab 05/10/23 0226 05/11/23 0429 05/12/23 0342 05/13/23 0407 05/14/23 0847 05/16/23 0630  NA 135 137 135 131* 133* 131*  K 4.1 3.6 3.4* 3.6 4.3 3.6  CL 111 110 107 102 104 97*  CO2 17* 18* 22 21* 21* 23  GLUCOSE 153* 174* 150* 150* 158* 166*  BUN 30* 22 15 13 13 21   CREATININE 0.46 0.54 0.47 0.46 0.47 0.63  CALCIUM 8.7* 8.5* 7.9* 8.1* 8.7* 8.8*  MG 2.3  --   --  1.9 1.7  --   PHOS 3.4  --   --  2.6  --   --     GFR: Estimated Creatinine Clearance: 58.5 mL/min (by C-G formula based on SCr of 0.63 mg/dL).  Liver Function Tests: Recent Labs  Lab 05/13/23 0407 05/16/23 0630  AST 12* 12*  ALT 13 11  ALKPHOS 66 58  BILITOT 0.5 0.6  PROT 5.0* 5.5*  ALBUMIN 2.2* 2.4*   No results for input(s): "LIPASE", "AMYLASE" in the last 168 hours. No results for input(s): "AMMONIA" in the last 168 hours.  Coagulation Profile: No results for input(s): "INR", "PROTIME" in the last 168 hours.  Cardiac Enzymes: No results for input(s): "CKTOTAL", "CKMB", "CKMBINDEX", "TROPONINI" in the last 168 hours.  BNP (last 3 results) No results for input(s): "PROBNP" in the last 8760 hours.  Lipid Profile: No results for input(s): "CHOL", "HDL", "LDLCALC", "TRIG", "CHOLHDL", "LDLDIRECT" in the last 72 hours.    Thyroid Function Tests: No results for input(s): "TSH", "T4TOTAL", "FREET4", "T3FREE", "THYROIDAB" in the last 72 hours.  Anemia Panel: No results for input(s): "VITAMINB12", "FOLATE", "FERRITIN", "TIBC", "IRON", "RETICCTPCT" in the last 72 hours.  Urine analysis:    Component Value Date/Time   COLORURINE AMBER (A) 04/23/2023 0623   APPEARANCEUR CLOUDY (A) 04/23/2023 0623   APPEARANCEUR Hazy 12/25/2012 1653   LABSPEC 1.024 04/23/2023 0623   LABSPEC 1.021 12/25/2012 1653   PHURINE 5.0 04/23/2023 0623   GLUCOSEU NEGATIVE 04/23/2023 0623    GLUCOSEU NEGATIVE 03/17/2023 1003   HGBUR MODERATE (A) 04/23/2023 0623   BILIRUBINUR NEGATIVE 04/23/2023 0623   BILIRUBINUR Negative 12/25/2012 1653   KETONESUR NEGATIVE 04/23/2023 0623   PROTEINUR 30 (A) 04/23/2023 0623   UROBILINOGEN 0.2 03/17/2023 1003   NITRITE NEGATIVE 04/23/2023 0623   LEUKOCYTESUR LARGE (A) 04/23/2023 0623   LEUKOCYTESUR Negative 12/25/2012 1653    Sepsis Labs: Lactic Acid, Venous    Component Value Date/Time   LATICACIDVEN 0.8 04/23/2023 0114  MICROBIOLOGY: No results found for this or any previous visit (from the past 240 hour(s)).   RADIOLOGY STUDIES/RESULTS: No results found.   LOS: 24 days   Jeoffrey Massed, MD  [facility]    To contact the attending provider between 7A-7P or the covering provider during after hours 7P-7A, please log into the web site [email or web address] and access using universal [place] password for that web site. If you do not have the password, please call the hospital operator.  05/16/2023, 10:40 AM

## 2023-05-16 NOTE — Progress Notes (Signed)
19 Days Post-Op   Subjective/Chief Complaint: Patient reports some crampy right-sided abdominal pain Increasing ostomy output WBC WNL/ Hgb stable at 7.5 TRH has ordered CT angio of abd to rule out GI bleed, although I think this will be low yield   Objective: Vital signs in last 24 hours: Temp:  [97.4 F (36.3 C)-98.2 F (36.8 C)] 97.4 F (36.3 C) (12/08 0739) Pulse Rate:  [70-82] 74 (12/08 0852) Resp:  [14-18] 16 (12/08 0852) BP: (98-148)/(52-81) 130/59 (12/08 0739) SpO2:  [97 %-100 %] 100 % (12/08 0852) Weight:  [86.5 kg] 86.5 kg (12/08 0600) Last BM Date : 05/16/23  Intake/Output from previous day: 12/07 0701 - 12/08 0700 In: 3699.4 [P.O.:960; I.V.:2477.6; IV Piggyback:261.9] Out: 1472.5 [Urine:1100; Drains:22.5; Stool:350] Intake/Output this shift: No intake/output data recorded.  Gen:  Alert, NAD Card:  RRR Pulm:  rate and effort normal on supplemental O2 via [place] Abd: soft, obese, mild generalized tenderness, open midline stable with few areas of breakdown and separation but fascia appears intact/ otherwise mostly healthy pink granulation tissue, Stoma viable; large amount of liquid stool in bag JP x2 - no significant output  Lab Results:  Recent Labs    05/15/23 0409 05/16/23 0227  WBC 7.9 8.2  HGB 7.5* 7.5*  HCT 24.7* 24.6*  PLT 216 217   BMET Recent Labs    05/14/23 0847 05/16/23 0630  NA 133* 131*  K 4.3 3.6  CL 104 97*  CO2 21* 23  GLUCOSE 158* 166*  BUN 13 21  CREATININE 0.47 0.63  CALCIUM 8.7* 8.8*   PT/INR No results for input(s): "LABPROT", "INR" in the last 72 hours. ABG No results for input(s): "PHART", "HCO3" in the last 72 hours.  Invalid input(s): "PCO2", "PO2"  Studies/Results: No results found.  Anti-infectives: Anti-infectives (From admission, onward)    Start     Dose/Rate Route Frequency Ordered Stop   05/10/23 1000  piperacillin-tazobactam (ZOSYN) IVPB 3.375 g        3.375 g 12.5 mL/hr over 240 Minutes Intravenous  Every 8 hours 05/10/23 0914     05/03/23 1400  piperacillin-tazobactam (ZOSYN) IVPB 3.375 g        3.375 g 12.5 mL/hr over 240 Minutes Intravenous Every 8 hours 05/03/23 0935 05/07/23 0124   04/27/23 1021  sodium chloride 0.9 % with cefoTEtan (CEFOTAN) ADS Med       Note to Pharmacy: Shanda Bumps M: cabinet override      04/27/23 1021 04/27/23 1253   04/26/23 0800  cefoTEtan (CEFOTAN) 2 g in sodium chloride 0.9 % 100 mL IVPB  Status:  Discontinued        2 g 200 mL/hr over 30 Minutes Intravenous To ShortStay Surgical 04/23/23 2111 04/27/23 0800   04/22/23 2300  piperacillin-tazobactam (ZOSYN) IVPB 3.375 g        3.375 g 12.5 mL/hr over 240 Minutes Intravenous Every 8 hours 04/22/23 2256 05/02/23 2359   04/22/23 1900  piperacillin-tazobactam (ZOSYN) IVPB 3.375 g        3.375 g 100 mL/hr over 30 Minutes Intravenous  Once 04/22/23 1852 04/22/23 1954       Assessment/Plan: Exploratory laparotomy, sigmoid colectomy, colostomy 11/19/24Dwain Sarna - CT 12/1 ileus, small fluid collection near colonic stump 3.5x1.6cm.  Zosyn restarted 12/2. JP x2 were purulent appearing but now with minimal serous output  - change order for CTA to CT abd/ pelvis w contrast to evaluate drains/ IAA.  Clinically, the patient does not have any evidence of GI bleed. -  Improved bowel function - full liquids -  Continue TPN until we are sure she is tolerating PO's.  - WOC following for new ostomy. Stoma with some mucocutaneous separation, monitor. Will need ostomy clinic referral at discharge  - vac unable to hold seal and was removed, continue BID wet to dry dressing changes - oob, pulm toilet   ID zosyn 11/14>>11/29, 12/2>> VTE - heparin heparin gtt for afib  FEN - TPN, FLD  LOS: 24 days    Wynona Luna 05/16/2023

## 2023-05-16 NOTE — Progress Notes (Addendum)
PHARMACY - TOTAL PARENTERAL NUTRITION CONSULT NOTE  Indication: Prolonged ileus  Patient Measurements: Height: 5' (152.4 cm) Weight: 86.5 kg (190 lb 11.2 oz) IBW/kg (Calculated) : 45.5 TPN AdjBW (KG): 54 Body mass index is 37.24 kg/m. Weight trends: 79.3kg (11/21) >>82.9 kg (11/30)  Assessment:  65 YOF with recurrent sigmoid diverticulitis and associated colovaginal fistula, HFrEF, COPD, Afib with PPM, and gout who was admitted with persistent symptoms with multiple abscesses s/p ex lap with sigmoid colectomy and end left colostomy on 11/19. Patient has been NPO since surgery. Pharmacy consulted to manage TPN.  Patient doesn't eat much at home, only 2 meals a day. For breakfast she might eat grits, an egg and coffee. For dinner she cooks her own food (doesn't like the food at facility) and might include chicken, green beans, carrots and rice. She does drink chocolate Boost regularly and thinks Boost tastes better than Ensure. Unfortunately, we only have Boost in wild berry, which patient dislikes.   Glucose / Insulin:  BG < 180, off SSI -hx T2DM, A1c 6.2%; no meds PTA   Electrolytes: Na 131, K 3.6, 12/6 Mg 1.7 >> gave 2g IV Renal: SCr 0.47, BUN wnl Hepatic: LFTs / tbili WNL, TG slightly elevated at 154, albumin 2.4 Intake / Output; MIVF: metoclopramide 12/2>> 12/8; UOP charted, on torsemide 40mg  po. JP drain 22.95mL, colostomy  GI Imaging: 11/21 KUB moderately dilated loops of small bowel, c/w post-operative ileus 11/23 KUB: persistent SBO or ileus, no significant change from prior 11/25 KUB: bowel obstruction or ileus, pleural effusion vs atelectasis 11/25 CTAP: post-op ileus or distal SBO, trace L pleural effusion 11/26 CXR: L > R lower lobe atelectasis 11/29 Abd XR: distention of SB concerning for obstruction or worsening postop ileus 12/1 CT: mild ileus 12/6 KUB: improved but not normalized ileus-like bowel gas pattern GI Surgeries / Procedures:  11/19 ex lap with  sigmoid colectomy and end left colostomy  Central access: PICC placed 04/30/23 TPN start date: 04/30/23  Nutritional Goals: Clinimix E 8/10 1L at 42 ml/hr with SMOF lipids on MTWThFSa Clinimix E 8/10 2L at 82 ml/hr on Sun with SMOFlipids - Average of 1258 kCals (~80% of goal) and 91g AA per day (100% goal) Previously on D10 fluids for add'l kCal >> stopped on 11/26 given concerns for pleural effusions/atelectasis noted on imaging, weights increased requiring Lasix IV - this has resolved so restarted D10 at 11/29, then changed to sodium bicarb D5 gtt   RD Estimated Needs Total Energy Estimated Needs: 1600-1800 kcal Total Protein Estimated Needs: 75-95 gm Total Fluid Estimated Needs: >1.6L  Current Nutrition:  TPN 12/5 CLD with worsening abd pain and N/V 12/6 NPO: bloated, + flatus; restart metoclopramide  12/7 FLD: ate 1-2 bites grits (too watery for her), sorbet, coffee 12/8 FLD: working on grits, Orange juice, coffee. Patient doesn't like vanilla flavored things, does drink Boost at home. Patient has had constant RUQ abdominal pain for the past few days that suddenly worsens at times. She's not sure what makes it worse, maybe movement; it is not correlated with eating. Patient would like to have high protein Chocolate Boost   Plan:  Clinimix 8/10E with lytes 1L at 42 mL/hr on MTuWThFriSa Clinimix 8/10E with lytes 2L at 82 mL/hr on Sun SMOFlipids over 12 hrs daily  TPN providing an average of 1258 kCals (~80% of goal) and 91g AA per day (100% goal) Electrolytes in Clinimix E 8/10 1L bag: Na 35 mEq, K 30 mEq, Mag  5 mEq, Ca 4.5 mEq, Phos , Acetate 83, Cl 76 mEq Electrolytes in Clinimix E 8/10 2L bag: Na 75 mEq, K 60 mEq, Mag 10 mEq, Ca 9 mEq, Phos 30 mmol, ZO:XWRU 1:1 Continue standard MVI and trace elements to TPN  Monitor TPN labs Mon/Thur and PRN  Add Chocolate Ensure max and oral protein supplement  F/U oral intake, wean off TPN as able  Thank you for involving  pharmacy in this patient's care.  Alphia Moh, PharmD, BCPS, BCCP Clinical Pharmacist  Please check AMION for all [facility] Pharmacy phone numbers After 10:00 PM, call Main Pharmacy [phone]

## 2023-05-16 NOTE — Progress Notes (Addendum)
  Abdominal pain: Patient is complaining about severe right-sided abdominal pain.  Patient reported initially she has left-sided abdominal pain now it is on the right side and getting worse.  Patient is very concerned that she is having internal abdominal bleeding from surgical site.  Lab check this morning showing stable hemoglobin 7.5.  Ostomy bag no evidence of bleeding except there is some external bleeding around ostomy wound. Patient has atrial fibrillation currently on heparin drip.  -Checking FOBT . - Obtaining CT angiogram abdomen to rule out any GI bleed.  I have low suspicion but for patient's satisfaction obtaining it. -Recommending nurse to hold the heparin drip until CT angiogram rules out any source of GI bleed.

## 2023-05-16 NOTE — Progress Notes (Addendum)
PHARMACY - ANTICOAGULATION CONSULT NOTE  Pharmacy Consult for Heparin gtt Indication: atrial fibrillation  Patient Measurements: Height: 5' (152.4 cm) Weight: 86.5 kg (190 lb 11.2 oz) IBW/kg (Calculated) : 45.5 Heparin Dosing Weight: 64 kg  Vital Signs: Temp: 97.4 F (36.3 C) (12/08 0739) Temp Source: Oral (12/08 0739) BP: 130/59 (12/08 0739) Pulse Rate: 74 (12/08 0852)  Labs: Recent Labs    05/14/23 0219 05/14/23 1610 05/14/23 9604 05/14/23 0847 05/15/23 0409 05/16/23 0227 05/16/23 0630  HGB  --  7.4*   < >  --  7.5* 7.5*  --   HCT  --  25.0*  --   --  24.7* 24.6*  --   PLT  --  204  --   --  216 217  --   HEPARINUNFRC 0.46  --   --   --  0.37 0.47  --   CREATININE  --   --   --  0.47  --   --  0.63   < > = values in this interval not displayed.   Estimated Creatinine Clearance: 58.5 mL/min (by C-G formula based on SCr of 0.63 mg/dL).  Assessment: [name] with history of atrial fibrillation on Xarelto prior to admission. Last dose of Xarelto 04/21/23 at 1900. Admitted for recurrent diverticulitis, perforation, and perirectal abscess. 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 Cleared by surgery to start hep gtt for afib.  Heparin level therapeutic at 0.47. Hgb 7.5 (low, stable). CTAP obtained to r/o GIB given incr in patient's abd pain - neg. Ok to resume heparin drip per d/w MD.   Goal of Therapy:  Heparin level 0.3-0.7 units/ml Monitor platelets by anticoagulation protocol: Yes   Plan:  No bolus per surgery Continue heparin at 1900 units/hr Heparin level/CBC daily Monitor for signs/symptoms of bleeding  Rexford Maus, PharmD, BCPS 05/16/2023 9:35 AM

## 2023-05-17 DIAGNOSIS — E440 Moderate protein-calorie malnutrition: Secondary | ICD-10-CM | POA: Diagnosis not present

## 2023-05-17 DIAGNOSIS — K5720 Diverticulitis of large intestine with perforation and abscess without bleeding: Secondary | ICD-10-CM | POA: Diagnosis not present

## 2023-05-17 DIAGNOSIS — E039 Hypothyroidism, unspecified: Secondary | ICD-10-CM | POA: Diagnosis not present

## 2023-05-17 DIAGNOSIS — E1140 Type 2 diabetes mellitus with diabetic neuropathy, unspecified: Secondary | ICD-10-CM | POA: Diagnosis not present

## 2023-05-17 LAB — CBC
HCT: 23.8 % — ABNORMAL LOW (ref 36.0–46.0)
Hemoglobin: 7.2 g/dL — ABNORMAL LOW (ref 12.0–15.0)
MCH: 26.1 pg (ref 26.0–34.0)
MCHC: 30.3 g/dL (ref 30.0–36.0)
MCV: 86.2 fL (ref 80.0–100.0)
Platelets: 210 10*3/uL (ref 150–400)
RBC: 2.76 MIL/uL — ABNORMAL LOW (ref 3.87–5.11)
RDW: 17.9 % — ABNORMAL HIGH (ref 11.5–15.5)
WBC: 8.1 10*3/uL (ref 4.0–10.5)
nRBC: 0.9 % — ABNORMAL HIGH (ref 0.0–0.2)

## 2023-05-17 LAB — COMPREHENSIVE METABOLIC PANEL
ALT: 10 U/L (ref 0–44)
AST: 10 U/L — ABNORMAL LOW (ref 15–41)
Albumin: 2.2 g/dL — ABNORMAL LOW (ref 3.5–5.0)
Alkaline Phosphatase: 49 U/L (ref 38–126)
Anion gap: 9 (ref 5–15)
BUN: 28 mg/dL — ABNORMAL HIGH (ref 8–23)
CO2: 24 mmol/L (ref 22–32)
Calcium: 8.7 mg/dL — ABNORMAL LOW (ref 8.9–10.3)
Chloride: 101 mmol/L (ref 98–111)
Creatinine, Ser: 0.66 mg/dL (ref 0.44–1.00)
GFR, Estimated: >60 mL/min (ref >60)
Glucose, Bld: 165 mg/dL — ABNORMAL HIGH (ref 70–99)
Potassium: 3.6 mmol/L (ref 3.5–5.1)
Sodium: 134 mmol/L — ABNORMAL LOW (ref 135–145)
Total Bilirubin: 0.5 mg/dL (ref <1.2)
Total Protein: 5.2 g/dL — ABNORMAL LOW (ref 6.5–8.1)

## 2023-05-17 LAB — MAGNESIUM: Magnesium: 1.6 mg/dL — ABNORMAL LOW (ref 1.7–2.4)

## 2023-05-17 LAB — TRIGLYCERIDES: Triglycerides: 135 mg/dL (ref <150)

## 2023-05-17 LAB — HEPARIN LEVEL (UNFRACTIONATED): Heparin Unfractionated: 0.48 [IU]/mL (ref 0.30–0.70)

## 2023-05-17 LAB — PHOSPHORUS: Phosphorus: 3.9 mg/dL (ref 2.5–4.6)

## 2023-05-17 MED ORDER — HYDROMORPHONE HCL 1 MG/ML IJ SOLN
0.5000 mg | INTRAMUSCULAR | Status: DC | PRN
  Administered 2023-05-17 – 2023-05-20 (×14): 1 mg via INTRAVENOUS
  Filled 2023-05-17 (×14): qty 1

## 2023-05-17 MED ORDER — MAGNESIUM SULFATE 2 GM/50ML IV SOLN
2.0000 g | Freq: Once | INTRAVENOUS | Status: AC
  Administered 2023-05-17: 2 g via INTRAVENOUS
  Filled 2023-05-17: qty 50

## 2023-05-17 MED ORDER — OXYCODONE HCL 5 MG PO TABS
5.0000 mg | ORAL_TABLET | ORAL | Status: DC | PRN
  Administered 2023-05-18: 9 mg via ORAL
  Administered 2023-05-19 – 2023-05-27 (×10): 10 mg via ORAL
  Filled 2023-05-17 (×11): qty 2
  Filled 2023-05-17: qty 1
  Filled 2023-05-17: qty 2

## 2023-05-17 MED ORDER — FAT EMUL FISH OIL/PLANT BASED 20% (SMOFLIPID)IV EMUL
250.0000 mL | INTRAVENOUS | Status: AC
  Administered 2023-05-17: 250 mL via INTRAVENOUS
  Filled 2023-05-17: qty 250

## 2023-05-17 MED ORDER — GUAIFENESIN ER 600 MG PO TB12
600.0000 mg | ORAL_TABLET | Freq: Two times a day (BID) | ORAL | Status: AC
  Administered 2023-05-17 – 2023-05-18 (×4): 600 mg via ORAL
  Filled 2023-05-17 (×4): qty 1

## 2023-05-17 MED ORDER — CLINIMIX E/DEXTROSE (8/10) 8 % IV SOLN
INTRAVENOUS | Status: AC
  Filled 2023-05-17 (×2): qty 1000

## 2023-05-17 MED ORDER — PANTOPRAZOLE SODIUM 40 MG PO TBEC
40.0000 mg | DELAYED_RELEASE_TABLET | Freq: Two times a day (BID) | ORAL | Status: DC
Start: 2023-05-17 — End: 2023-05-27
  Administered 2023-05-17 – 2023-05-27 (×20): 40 mg via ORAL
  Filled 2023-05-17 (×20): qty 1

## 2023-05-17 MED ORDER — ADULT MULTIVITAMIN W/MINERALS CH
1.0000 | ORAL_TABLET | Freq: Every day | ORAL | Status: DC
  Administered 2023-05-17 – 2023-05-27 (×10): 1 via ORAL
  Filled 2023-05-17 (×10): qty 1

## 2023-05-17 MED ORDER — NYSTATIN 100000 UNIT/GM EX POWD
Freq: Two times a day (BID) | CUTANEOUS | Status: DC
Start: 2023-05-17 — End: 2023-05-22
  Filled 2023-05-17: qty 15

## 2023-05-17 MED ORDER — ENSURE ENLIVE PO LIQD
237.0000 mL | Freq: Three times a day (TID) | ORAL | Status: DC
  Administered 2023-05-17 – 2023-05-27 (×22): 237 mL via ORAL

## 2023-05-17 MED ORDER — DOCUSATE SODIUM 100 MG PO CAPS
100.0000 mg | ORAL_CAPSULE | Freq: Two times a day (BID) | ORAL | Status: DC
  Administered 2023-05-17 – 2023-05-27 (×17): 100 mg via ORAL
  Filled 2023-05-17 (×18): qty 1

## 2023-05-17 NOTE — Plan of Care (Signed)

## 2023-05-17 NOTE — Progress Notes (Signed)
PHARMACY - ANTICOAGULATION CONSULT NOTE  Pharmacy Consult for Heparin gtt Indication: atrial fibrillation  Patient Measurements: Height: 5' (152.4 cm) Weight: 87 kg (191 lb 12.8 oz) IBW/kg (Calculated) : 45.5 Heparin Dosing Weight: 64 kg  Vital Signs:    Labs: Recent Labs    05/15/23 0409 05/16/23 0227 05/16/23 0630 05/17/23 0306  HGB 7.5* 7.5*  --  7.2*  HCT 24.7* 24.6*  --  23.8*  PLT 216 217  --  210  HEPARINUNFRC 0.37 0.47  --  0.48  CREATININE  --   --  0.63 0.66   Estimated Creatinine Clearance: 58.7 mL/min (by C-G formula based on SCr of 0.66 mg/dL).  Assessment: Kathleen [name] with history of atrial fibrillation on Xarelto prior to admission. Last dose of Xarelto 04/21/23 at 1900. Admitted for recurrent diverticulitis, perforation, and perirectal abscess. 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 Cleared by surgery to start hep gtt for afib.  Heparin level therapeutic at 0.48. Hgb 7.2 (low, stable).    Goal of Therapy:  Heparin level 0.3-0.7 units/ml Monitor platelets by anticoagulation protocol: Yes   Plan:   Continue heparin at 1900 units/hr Heparin level/CBC daily Monitor for signs/symptoms of bleeding F/u restart rivaroxaban  Alphia Moh, PharmD, BCPS, [facility] Clinical Pharmacist  Please check AMION for all [facility] Pharmacy phone numbers After 10:00 PM, call Main Pharmacy [phone]

## 2023-05-17 NOTE — Progress Notes (Addendum)
Patient ID: Kathleen [name], female   DOB: February 21, 1946, 77 y.o.   MRN: [id number] [facility] Surgery Progress Note  20 Days Post-Op  Subjective: CC-  Not having a great day. Feeling more congested. Right sided abdominal pain is stable. Nausea is stable. No emesis. Tolerated full liquids yesterday. Did not drink any protein shakes. Colostomy productive. WBC 8.1, afebrile. CT yesterday fairly stable with small 3.8 x 2.2 cm fluid collection superior and to the right of the Hartmann's pouch. JP drain #1 purulent appearing, 20cc/24hr.  Objective: Vital signs in last 24 hours: Temp:  [97.8 F (36.6 C)-98.1 F (36.7 C)] 97.9 F (36.6 C) (12/08 2353) Pulse Rate:  [69-76] 70 (12/08 2353) Resp:  [17-19] 19 (12/08 2353) BP: (98-108)/(44-59) 102/44 (12/08 2353) SpO2:  [100 %] 100 % (12/09 0733) Weight:  [87 kg] 87 kg (12/09 0600) Last BM Date : 05/16/23  Intake/Output from previous day: 12/08 0701 - 12/09 0700 In: 1269.2 [P.O.:720; I.V.:549.2] Out: 940 [Urine:800; Drains:40; Stool:100] Intake/Output this shift: No intake/output data recorded.  PE: Gen:  Alert, NAD Card:  RRR Pulm:  rate and effort normal on supplemental O2 via [place] Abd: soft, obese, mild right sided tenderness, open midline stable with few areas of breakdown and separation but fascia appears intact/ otherwise mostly healthy pink granulation tissue, Stoma viable with stable mucocutaneous separation at 5 o'clock position. JP#1 tan/purulent, JP#2 thinner seropurulent appearing      Lab Results:  Recent Labs    05/16/23 0227 05/17/23 0306  WBC 8.2 8.1  HGB 7.5* 7.2*  HCT 24.6* 23.8*  PLT 217 210   BMET Recent Labs    05/16/23 0630 05/17/23 0306  NA 131* 134*  K 3.6 3.6  CL 97* 101  CO2 23 24  GLUCOSE 166* 165*  BUN 21 28*  CREATININE 0.63 0.66  CALCIUM 8.8* 8.7*   PT/INR No results for input(s): "LABPROT", "INR" in the last 72 hours. CMP     Component Value Date/Time   NA 134 (L) 05/17/2023  0306   NA 130 (L) 12/28/2012 0748   K 3.6 05/17/2023 0306   K 4.6 12/28/2012 0748   CL 101 05/17/2023 0306   CL 95 (L) 12/28/2012 0748   CO2 24 05/17/2023 0306   CO2 23 12/28/2012 0748   GLUCOSE 165 (H) 05/17/2023 0306   GLUCOSE 391 (H) 12/28/2012 0748   BUN 28 (H) 05/17/2023 0306   BUN 32 (H) 12/28/2012 0748   CREATININE 0.66 05/17/2023 0306   CREATININE 0.86 01/27/2023 1137   CALCIUM 8.7 (L) 05/17/2023 0306   CALCIUM 14.3 (HH) 06/26/2019 1618   PROT 5.2 (L) 05/17/2023 0306   ALBUMIN 2.2 (L) 05/17/2023 0306   AST 10 (L) 05/17/2023 0306   ALT 10 05/17/2023 0306   ALKPHOS 49 05/17/2023 0306   BILITOT 0.5 05/17/2023 0306   GFRNONAA >60 05/17/2023 0306   GFRNONAA 43 (L) 12/28/2012 0748   GFRAA 58 (L) 06/28/2019 0427   GFRAA 50 (L) 12/28/2012 0748   Lipase     Component Value Date/Time   LIPASE 30 04/22/2023 1840       Studies/Results: CT ABDOMEN PELVIS W CONTRAST  Result Date: 05/16/2023 CLINICAL DATA:  Abdominal pain.  Postop. EXAM: CT ABDOMEN AND PELVIS WITH CONTRAST TECHNIQUE: Multidetector CT imaging of the abdomen and pelvis was performed using the standard protocol following bolus administration of intravenous contrast. RADIATION DOSE REDUCTION: This exam was performed according to the departmental dose-optimization program which includes automated exposure control, adjustment of the  mA and/or kV according to patient size and/or use of iterative reconstruction technique. CONTRAST:  75mL OMNIPAQUE IOHEXOL 350 MG/ML SOLN COMPARISON:  05/09/2023. FINDINGS: Lower chest: Mild dependent atelectasis, left greater than right, left improved from the prior CT. Hepatobiliary: Normal liver size and overall attenuation. Scattered small cysts, stable. Prior cholecystectomy. No bile duct dilation. Pancreas: Unremarkable. Spleen: Normal. Adrenals/Urinary Tract: No adrenal mass. Stable lower pole left renal cyst, with no follow-up recommended. Kidneys with symmetric enhancement and  excretion. No hydronephrosis. Ureters normal in course and in caliber. Bladder mildly distended, unremarkable. Stomach/Bowel: Status post left colon resection, formation of a Hartmann's pouch and a left lower quadrant colostomy. Postsurgical lung inflammatory changes are noted in the low abdomen and pelvis with associated surgical drains, stable from the previous CT. Small collection lies superior and to the right of the Hartmann's pouch measuring 3.8 x 2.2 cm, 3.6 x 1.6 cm on the prior CT. There are no other defined collections. Small-bowel dilation with air-fluid levels, with less distension than on the prior CT. No small bowel wall thickening. No colonic dilation, wall thickening or inflammation. Vascular/Lymphatic: Aortic atherosclerosis. No aneurysm. No enlarged lymph nodes. Reproductive: Status post hysterectomy. Other: No free air.  Open midline incision to the fascia. Musculoskeletal: No acute findings or change from the prior CT. IMPRESSION: 1. Postsurgical changes related to left colon resection, formation of a left lower quadrant colostomy and formation of a Hartmann's pouch. Small collection adjacent to the pouch is minimally larger than on the prior CT. No other defined collections. 2. Small-bowel dilation with air-fluid levels, consistent with an ileus, mildly improved from the prior CT. 3. Aortic atherosclerosis. Aortic Atherosclerosis (ICD10-I70.0). Electronically Signed   By: Amie Portland M.D.   On: 05/16/2023 13:00    Anti-infectives: Anti-infectives (From admission, onward)    Start     Dose/Rate Route Frequency Ordered Stop   05/10/23 1000  piperacillin-tazobactam (ZOSYN) IVPB 3.375 g        3.375 g 12.5 mL/hr over 240 Minutes Intravenous Every 8 hours 05/10/23 0914     05/03/23 1400  piperacillin-tazobactam (ZOSYN) IVPB 3.375 g        3.375 g 12.5 mL/hr over 240 Minutes Intravenous Every 8 hours 05/03/23 0935 05/07/23 0124   04/27/23 1021  sodium chloride 0.9 % with cefoTEtan  (CEFOTAN) ADS Med       Note to Pharmacy: Shanda Bumps M: cabinet override      04/27/23 1021 04/27/23 1253   04/26/23 0800  cefoTEtan (CEFOTAN) 2 g in sodium chloride 0.9 % 100 mL IVPB  Status:  Discontinued        2 g 200 mL/hr over 30 Minutes Intravenous To [facility] 04/23/23 2111 04/27/23 0800   04/22/23 2300  piperacillin-tazobactam (ZOSYN) IVPB 3.375 g        3.375 g 12.5 mL/hr over 240 Minutes Intravenous Every 8 hours 04/22/23 2256 05/02/23 2359   04/22/23 1900  piperacillin-tazobactam (ZOSYN) IVPB 3.375 g        3.375 g 100 mL/hr over 30 Minutes Intravenous  Once 04/22/23 1852 04/22/23 1954        Assessment/Plan PD#20 S/p Exploratory laparotomy, sigmoid colectomy, colostomy 11/19/24Dwain Sarna - CT 12/8 fairly unchanged with mild ileus and small 3.8x2.2cm fluid collection near colonic stump.  Zosyn restarted 12/2. JP#1 tan/purulent appearing today. Continue drains. Will stop zosyn today 12/9 - Reglan stopped 12/8.  - Having some bowel function but not feeling great today. Will continue full liquids today, Ensure. Continue TPN  until we are sure she is tolerating PO's.  - WOC following for new ostomy. Stoma with some mucocutaneous separation, monitor. Will need ostomy clinic referral at discharge  - vac unable to hold seal and was removed, continue BID wet to dry dressing changes - oob, pulm toilet - mucinex for congestion    ID zosyn 11/14>>11/29, 12/2>>12/9 VTE - heparin heparin gtt for afib  FEN - TPN, FLD, Ensure    LOS: 25 days    Franne Forts, [facility] Surgery 05/17/2023, 9:20 AM Please see Amion for pager number during day hours 7:00am-4:30pm

## 2023-05-17 NOTE — Consult Note (Addendum)
WOC Nurse ostomy Follow up note Stoma type/location: Stoma is red and viable, 1 1/2 inches, slightly above skin level.  Detachment (stoma separation) 5`clock with 1 cm tunneling, 1.2x0.5 cm, 30 ml of stools.   Pt is was drowsy, and when the stoma was functioning, she complain about the pain on th left side of the abd.  Orientated part of the ostomy care, but she was too tired to listening all the instructions. Planning teach further in the next consult.   Applied pouch as follows: Barrier ring and 2 piece convex pouching system. Requested more supplies at the bedside:  Use Supplies: Barrier ring [phone] Pouch #[id number] Barrier [id number]. 5 pieces of each. Applied Aquacel on the stoma separation.  She is not independent by the ostomy care. Refer to a SNF.   Thank-you,  Denyse Amass BSN, RN, [employer], WOC  (Pager: [phone])

## 2023-05-17 NOTE — Progress Notes (Signed)
Nutrition Follow-up  DOCUMENTATION CODES:   Non-severe (moderate) malnutrition in context of chronic illness  INTERVENTION:  Advance diet as medically applicable Pharmacy to adjust TPN as diet advances Ensure Plus High Protein po BID, each supplement provides 350 kcal and 20 grams of protein.     NUTRITION DIAGNOSIS:   Moderate Malnutrition related to chronic illness as evidenced by mild fat depletion, mild muscle depletion, energy intake < 75% for > 7 days. Continues to be valid with intervention in place.    GOAL:   Patient will meet greater than or equal to 90% of their needs    MONITOR:   PO intake, Supplement acceptance, Diet advancement, Labs, Weight trends  REASON FOR ASSESSMENT:   Consult New TPN/TNA  ASSESSMENT:   77 y.o. F, admitted with PMH of hypothyroidism, COPD,OSA (on BiPAP), BOOP (noncandidate for stenting), diverticulitis, CHF, obesity, HTN, gout, and colovaginal fistula. Presented 11/14 for worsening diverticulitis with new bowel perforation. Patient very tired on this day and was not wanting to talk to this RD today. During rounds MD stated that unable to advance diet. Continues on TPN support and clear liquid diet.   Labs and medications reviewed Pharmacy plan: Clinimix E 8/10 1L at 42 mL/hr and SMOFlipids at 20.8 ml/hr x 12 hrs on MTWThFSa Clinimix E 8/10 2L at 82 mL/hr and SMOFlipids at 20.8 ml/hr x 12 hrs on Sunday TPN providing an average of 1258 kCals (~80% of goal) and 91g AA per day (100% goal) Continue standard MVI and trace elements to TPN Reduce SSI to sensitive Q4H KCl 10 mEq IV x5 Mag 2 g IV x1 Bicarb gtt d/c 12/4 - monitor CO2 closely Monitor TPN labs Mon and Thur and PRN  F/U ability to wean TPN on 12/6, starting FLD per Surgery   11/21 KUB moderately dilated loops of small bowel, c/w post-operative ileus 11/23 KUB: persistent SBO or ileus, no significant change from prior 11/25 KUB: bowel obstruction or ileus, pleural effusion vs  atelectasis 11/25 CTAP: post-op ileus or distal SBO, trace L pleural effusion 11/26 CXR: L > R lower lobe atelectasis 11/29 Abd XR: distention of SB concerning for obstruction or worsening postop ileus 12/1 CT: mild ileus   NUTRITION - FOCUSED PHYSICAL EXAM:  Flowsheet Row Most Recent Value  Orbital Region Mild depletion  Upper Arm Region Mild depletion  Thoracic and Lumbar Region Unable to assess  [Fluid]  Buccal Region No depletion  Temple Region Moderate depletion  Clavicle Bone Region Unable to assess  [Fluid]  Clavicle and Acromion Bone Region Mild depletion  Scapular Bone Region Mild depletion  Dorsal Hand Mild depletion  Patellar Region Unable to assess  [Fluid]  Anterior Thigh Region Unable to assess  [Fluid]  Posterior Calf Region Unable to assess  [Fluid]  Edema (RD Assessment) Severe  [Abdomen and leg edema]  Hair Reviewed  Eyes Reviewed  Mouth Reviewed  Skin Reviewed  Nails Reviewed       Diet Order:   Diet Order             Diet full liquid Room service appropriate? Yes; Fluid consistency: Thin  Diet effective now                   EDUCATION NEEDS:   Education needs have been addressed  Skin:  Skin Assessment: Reviewed RN Assessment Skin Integrity Issues:: Wound VAC Wound Vac: abdomen Other: new colostomy (11/18)  Last BM:  12/8  Height:   Ht Readings from Last 1 Encounters:  04/27/23 5' (1.524 m)    Weight:   Wt Readings from Last 1 Encounters:  05/17/23 87 kg    Ideal Body Weight:  45.45 kg  BMI:  Body mass index is 37.46 kg/m.  Estimated Nutritional Needs:   Kcal:  1600-1800 kcal  Protein:  75-95 gm  Fluid:  >1.6L    Jamelle Haring RDN, LDN Clinical Dietitian  Pleas see Amion for contact information

## 2023-05-17 NOTE — Plan of Care (Signed)
Pt has rested quietly throughout the night with no distress noted.  Alert and oriented. On O2 3LNC. A-V paced on the monitor. Right UA PICC intact with dressing CDI. TPN, lipids and heparin infusing. Up to [facility] to void. LLQ colostomy intact brown stool. Medicated twice for pain with relief noted. Pt crying at times. States she is tired of hurting all the time. Midline dressing done as ordered, pt tolerated well. Jp's intact. No other complaints voiced.     Problem: Coping: Goal: Ability to adjust to condition or change in health will improve Outcome: Progressing   Problem: Education: Goal: Knowledge of General Education information will improve Description: Including pain rating scale, medication(s)/side effects and non-pharmacologic comfort measures Outcome: Progressing   Problem: Health Behavior/Discharge Planning: Goal: Ability to manage health-related needs will improve Outcome: Progressing   Problem: Activity: Goal: Risk for activity intolerance will decrease Outcome: Progressing   Problem: Coping: Goal: Level of anxiety will decrease Outcome: Progressing   Problem: Pain Management: Goal: General experience of comfort will improve Outcome: Progressing

## 2023-05-17 NOTE — Progress Notes (Signed)
PHARMACY - TOTAL PARENTERAL NUTRITION CONSULT NOTE  Indication: Prolonged ileus  Patient Measurements: Height: 5' (152.4 cm) Weight: 87 kg (191 lb 12.8 oz) IBW/kg (Calculated) : 45.5 TPN AdjBW (KG): 54 Body mass index is 37.46 kg/m. 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 Patient has been NPO since surgery. Pharmacy consulted to manage TPN.  Weights have been stable in the last year ~182-194 lbs. Patient doesn't eat much at home, only 2 meals a day. For breakfast she might eat grits, an egg and coffee. For dinner she cooks her own food (doesn't like the food at facility) and might include chicken, green beans, carrots and rice. She does drink chocolate Boost regularly and thinks Boost tastes better than Ensure. She does not like vanilla or berry flavored supplements. Unfortunately, we only have Boost in wild berry.  Glucose / Insulin:  BG < 180, off SSI -hx T2DM, A1c 6.2%; no meds PTA   Electrolytes: Na 134, K 3.6, Mg 1.6 Renal: SCr 0.66, BUN 28 Hepatic: AST 10 low, ALT/tbili WNL, TG 135, albumin 2.2 Intake / Output; MIVF: metoclopramide 12/2>> 12/8; UOP and 6x charted, on torsemide 40mg  po. JP drain 25mL, colostomy  GI Imaging: 11/21 KUB moderately dilated loops of small bowel, c/w post-operative ileus 11/23 KUB: persistent SBO or ileus, no significant change from prior 11/25 KUB: bowel obstruction or ileus, pleural effusion vs atelectasis 11/25 CTAP: post-op ileus or distal SBO, trace L pleural effusion 11/26 CXR: L > R lower lobe atelectasis 11/29 Abd XR: distention of SB concerning for obstruction or worsening postop ileus 12/1 CT: mild ileus 12/6 KUB: improved but not normalized ileus-like bowel gas  pattern 12/7 CT:  Small-bowel dilation with air-fluid levels, consistent with ileus, mildly improved from the prior  GI Surgeries / Procedures:  11/19 ex lap with sigmoid colectomy and end left colostomy  Central access: PICC placed 04/30/23 TPN start date: 04/30/23  Nutritional Goals: Clinimix E 8/10 1L at 42 ml/hr with SMOF lipids on MTWThFSa Clinimix E 8/10 2L at 82 ml/hr on Sun with SMOFlipids - Average of 1258 kCals (~80% of goal) and 91g AA per day (100% goal) Previously on D10 fluids for add'l kCal >> stopped on 11/26 given concerns for pleural effusions/atelectasis noted on imaging, weights increased requiring Lasix IV - this has resolved so restarted D10 at 11/29, then changed to sodium bicarb D5 gtt   RD Estimated Needs Total Energy Estimated Needs: 1600-1800 kcal Total Protein Estimated Needs: 75-95 gm Total Fluid Estimated Needs: >1.6L  Current Nutrition:  TPN 12/5 CLD with worsening abd pain and N/V 12/6 NPO: bloated, + flatus; restart metoclopramide  12/7 FLD: ate 1-2 bites grits (too watery for her), sorbet, coffee 12/8 FLD: ate half bowl of grits, whole tomato soup, 1 jello, lemonade. Patient doesn't like vanilla flavored things, does drink Boost at home. Patient has had constant RUQ abdominal pain for the past few days that suddenly worsens at times. She's not sure what makes it worse, maybe movement; it is not correlated with eating. Patient would like to have high protein Chocolate Boost  12/9 FLD: no appetite but abd pain is better. States she's not a breakfast person. Will try to drink protein supplement though she's doesn't like the flavor and Max  protein Ensure.   Plan:  Clinimix 8/10E with lytes 1L at 42 mL/hr on MTuWThFriSa Clinimix 8/10E with lytes 2L at 82 mL/hr on Sun SMOFlipids over 12 hrs daily  TPN providing an average of 1258 kCals (~80% of goal) and 91g AA per day (100% goal) Electrolytes in Clinimix E 8/10 1L bag: Na 35 mEq, K 30 mEq, Mag  5 mEq, Ca 4.5 mEq, Phos , Acetate 83, Cl 76 mEq Electrolytes in Clinimix E 8/10 2L bag: Na 75 mEq, K 60 mEq, Mag 10 mEq, Ca 9 mEq, Phos 30 mmol, IE:PPIR 1:1 No MVI or trace elements in Clinimix, give MVI by mouth  Monitor TPN labs Mon/Thur and PRN  Continue Chocolate Ensure max and oral protein supplement  F/U oral intake, wean off TPN as able  Thank you for involving pharmacy in this patient's care.  Alphia Moh, PharmD, BCPS, BCCP Clinical Pharmacist  Please check AMION for all [facility] Pharmacy phone numbers After 10:00 PM, call Main Pharmacy [phone]

## 2023-05-17 NOTE — Progress Notes (Signed)
Mobility Specialist Progress Note;   05/17/23 1003  Mobility  Activity Transferred to/from [facility];Transferred from bed to chair  Level of Assistance Contact guard assist, steadying assist  Assistive Device Other (Comment) (HHA)  Distance Ambulated (ft) 5 ft  Activity Response Tolerated well  Mobility Referral Yes  Mobility visit 1 Mobility  Mobility Specialist Start Time (ACUTE ONLY) 1003  Mobility Specialist Stop Time (ACUTE ONLY) 1023  Mobility Specialist Time Calculation (min) (ACUTE ONLY) 20 min   Pt requesting assistance to [facility]. Required MinG assistance via HHA to transfer from bed to [facility] to chair. Pt c/o abd pain and fatigue during session. VSS throughout. Pt left in chair with all needs met.   Caesar Bookman Mobility Specialist Please contact via SecureChat or Rehab Office [phone]

## 2023-05-17 NOTE — Progress Notes (Signed)
PROGRESS NOTE        PATIENT DETAILS Name: Kathleen [name] Age: 77 y.o. Sex: female Date of Birth: 09-27-1945 Admit Date: 04/22/2023 Admitting Physician Gery Pray, MD LKG:[id number], Bernadene Bell, MD  Brief Summary:  Patient is a 77 y.o.  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  Admitted by [facility]-started on empiric IV antibiotics-underwent preop assessment by cardiology-and then subsequently underwent  exploratory laparotomy with sigmoid colectomy and end ostomy on 11/19.  Postoperative course complicated by ileus-requiring NG tube decompression-and TNA for nutrition.  See below for further details.  Significant events: 11/14>> admit to [facility] 11/19>>exploratory laparotomy with sigmoid colectomy and end ostomy  12/04>> NG removed-clear liquid started  Significant studies: 11/14>> CT abdomen/pelvis: Worsening signal right diverticulitis-localized perforation-5.6 cm perirectal abscess, 6.0 cm left lower quadrant abscess. 11/22>> x-ray abdomen: Consistent with postoperative ileus. 11/25>> CT abdomen/pelvis: No abscess 12/01>> CT abdomen/pelvis: Mild small bowel ileus-no obvious abscess-small fluid collection in the colonic stump  Significant microbiology data: 11/14>> blood culture: No growth 11/14>> COVID/influenza/RSV PCR: Negative  Procedures: 11/19>>exploratory laparotomy with sigmoid colectomy  end ostomy on 11/19  Consults: Cardiology GI General surgery  Subjective: Abdominal pain continues-unchanged-tolerating full liquids.  No output in ostomy during my exam but bag just changed a few hours ago.  Objective: Vitals: Blood pressure (!) 102/44, pulse 70, temperature 97.9 F (36.6 C), temperature source Oral, resp. rate 19, height 5' (1.524 m),  weight 87 kg, SpO2 100%.   Exam: Gen Exam:Alert awake-not in any distress HEENT:atraumatic, normocephalic Chest: B/L clear to auscultation anteriorly CVS:S1S2 regular Abdomen: Soft-mildly tender in the right mid abdominal area without any peritoneal signs. 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 General surgery continues to follow  Hypokalemia/hypophosphatemia  Continue to check/replete.  Normocytic anemia Secondary to critical illness No evidence of blood loss Follow CBC.  Persistent atrial fibrillation Telemetry monitoring Tikosyn orally IV heparin-probably could be switched to Eliquis in the next day or so. 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  GERD PPI  DM-2 (A1c 6.2 on 11/15) CBGs stable on SSI  Recent Labs    05/14/23 1927 05/14/23 2351 05/15/23 0350  GLUCAP 146* 156* 160*   Nutrition Status: Nutrition Problem: Moderate Malnutrition Etiology: chronic illness Signs/Symptoms: mild fat depletion, mild muscle depletion, energy intake < 75% for > 7 days Interventions: Ensure Enlive (each supplement provides 350kcal and 20 grams of protein), Magic cup, MVI  Obesity: Estimated body mass index is 37.46 kg/m as calculated from the following:   Height as of this encounter: 5' (1.524 m).   Weight as of this encounter: 87 kg.   Code status:   Code Status: Limited: Do not attempt resuscitation (DNR) -DNR-LIMITED -Do Not Intubate/DNI    DVT Prophylaxis: IV heparin   Family Communication: None at bedside.   Disposition Plan: Status is: Inpatient Remains inpatient appropriate because: Severity of illness   Planned Discharge Destination:Skilled nursing facility   Diet: Diet Order             Diet full liquid Room service appropriate? Yes; Fluid consistency: Thin  Diet effective now                     Antimicrobial agents: Anti-infectives (From admission, onward)    Start     Dose/Rate Route Frequency Ordered Stop   05/10/23 1000  piperacillin-tazobactam (ZOSYN) IVPB 3.375 g        3.375 g 12.5 mL/hr over 240 Minutes Intravenous Every 8 hours 05/10/23 0914     05/03/23 1400  piperacillin-tazobactam (ZOSYN) IVPB 3.375 g        3.375 g 12.5 mL/hr over 240 Minutes Intravenous Every 8 hours 05/03/23 0935 05/07/23 0124   04/27/23 1021  sodium chloride 0.9 % with cefoTEtan (CEFOTAN) ADS Med       Note to Pharmacy: Shanda Bumps M: cabinet override      04/27/23 1021 04/27/23 1253   04/26/23 0800  cefoTEtan (CEFOTAN) 2 g in sodium chloride 0.9 % 100 mL IVPB  Status:  Discontinued        2 g 200 mL/hr over 30 Minutes Intravenous To [facility] 04/23/23 2111 04/27/23 0800   04/22/23 2300   piperacillin-tazobactam (ZOSYN) IVPB 3.375 g        3.375 g 12.5 mL/hr over 240 Minutes Intravenous Every 8 hours 04/22/23 2256 05/02/23 2359   04/22/23 1900  piperacillin-tazobactam (ZOSYN) IVPB 3.375 g        3.375 g 100 mL/hr over 30 Minutes Intravenous  Once 04/22/23 1852 04/22/23 1954        MEDICATIONS: Scheduled Meds:  (feeding supplement) PROSource Plus  30 mL Oral BID BM   acetaminophen  650 mg Oral Q6H   Chlorhexidine Gluconate Cloth  6 each Topical Daily   docusate sodium  100 mg Oral BID   dofetilide  250 mcg Oral Q12H   feeding supplement  237 mL Oral TID BM   fluticasone furoate-vilanterol  1 puff Inhalation Daily   gabapentin  200 mg Oral QHS   guaiFENesin  600 mg Oral BID   lidocaine  1 patch Transdermal Q24H   magic mouthwash  5 mL Oral QID   methocarbamol  500 mg Oral Q6H   multivitamin with minerals  1 tablet Oral Daily   nystatin   Topical BID   mouth rinse  15 mL Mouth Rinse 4 times per day   pantoprazole  40 mg Oral  BID   Ensure Max Protein  11 oz Oral BID BM   revefenacin  175 mcg Nebulization Daily   spironolactone  25 mg Oral Daily   torsemide  40 mg Oral Daily   Continuous Infusions:  TPN (CLINIMIX-E) Adult     And   fat emul(SMOFlipid)     heparin 1,900 Units/hr (05/17/23 0205)   magnesium sulfate bolus IVPB     piperacillin-tazobactam (ZOSYN)  IV 3.375 g (05/17/23 0844)   promethazine (PHENERGAN) injection (IM or IVPB) Stopped (05/11/23 1422)   TPN (CLINIMIX-E) Adult Stopped (05/16/23 1806)   PRN Meds:.albuterol, HYDROmorphone (DILAUDID) injection, ondansetron (ZOFRAN) IV, oxyCODONE, promethazine (PHENERGAN) injection (IM or IVPB), sodium chloride flush   I have personally reviewed following labs and imaging studies  LABORATORY DATA: CBC: Recent Labs  Lab 05/13/23 0407 05/14/23 0838 05/15/23 0409 05/16/23 0227 05/17/23 0306  WBC 8.1 8.0 7.9 8.2 8.1  HGB 7.9* 7.4* 7.5* 7.5* 7.2*  HCT 26.4* 25.0* 24.7* 24.6* 23.8*  MCV 85.7 85.0  85.5 86.0 86.2  PLT 233 204 216 217 210    Basic Metabolic Panel: Recent Labs  Lab 05/12/23 0342 05/13/23 0407 05/14/23 0847 05/16/23 0630 05/17/23 0306  NA 135 131* 133* 131* 134*  K 3.4* 3.6 4.3 3.6 3.6  CL 107 102 104 97* 101  CO2 22 21* 21* 23 24  GLUCOSE 150* 150* 158* 166* 165*  BUN 15 13 13 21  28*  CREATININE 0.47 0.46 0.47 0.63 0.66  CALCIUM 7.9* 8.1* 8.7* 8.8* 8.7*  MG  --  1.9 1.7  --  1.6*  PHOS  --  2.6  --   --  3.9    GFR: Estimated Creatinine Clearance: 58.7 mL/min (by C-G formula based on SCr of 0.66 mg/dL).  Liver Function Tests: Recent Labs  Lab 05/13/23 0407 05/16/23 0630 05/17/23 0306  AST 12* 12* 10*  ALT 13 11 10   ALKPHOS 66 58 49  BILITOT 0.5 0.6 0.5  PROT 5.0* 5.5* 5.2*  ALBUMIN 2.2* 2.4* 2.2*   No results for input(s): "LIPASE", "AMYLASE" in the last 168 hours. No results for input(s): "AMMONIA" in the last 168 hours.  Coagulation Profile: No results for input(s): "INR", "PROTIME" in the last 168 hours.  Cardiac Enzymes: No results for input(s): "CKTOTAL", "CKMB", "CKMBINDEX", "TROPONINI" in the last 168 hours.  BNP (last 3 results) No results for input(s): "PROBNP" in the last 8760 hours.  Lipid Profile: Recent Labs    05/17/23 0306  TRIG 135      Thyroid Function Tests: No results for input(s): "TSH", "T4TOTAL", "FREET4", "T3FREE", "THYROIDAB" in the last 72 hours.  Anemia Panel: No results for input(s): "VITAMINB12", "FOLATE", "FERRITIN", "TIBC", "IRON", "RETICCTPCT" in the last 72 hours.  Urine analysis:    Component Value Date/Time   COLORURINE AMBER (A) 04/23/2023 0623   APPEARANCEUR CLOUDY (A) 04/23/2023 0623   APPEARANCEUR Hazy 12/25/2012 1653   LABSPEC 1.024 04/23/2023 0623   LABSPEC 1.021 12/25/2012 1653   PHURINE 5.0 04/23/2023 0623   GLUCOSEU NEGATIVE 04/23/2023 0623   GLUCOSEU NEGATIVE 03/17/2023 1003   HGBUR MODERATE (A) 04/23/2023 0623   BILIRUBINUR NEGATIVE 04/23/2023 0623   BILIRUBINUR Negative  12/25/2012 1653   KETONESUR NEGATIVE 04/23/2023 0623   PROTEINUR 30 (A) 04/23/2023 0623   UROBILINOGEN 0.2 03/17/2023 1003   NITRITE NEGATIVE 04/23/2023 0623   LEUKOCYTESUR LARGE (A) 04/23/2023 0623   LEUKOCYTESUR Negative 12/25/2012 1653    Sepsis Labs: Lactic Acid, Venous    Component Value Date/Time  LATICACIDVEN 0.8 04/23/2023 0114    MICROBIOLOGY: No results found for this or any previous visit (from the past 240 hour(s)).   RADIOLOGY STUDIES/RESULTS: CT ABDOMEN PELVIS W CONTRAST  Result Date: 05/16/2023 CLINICAL DATA:  Abdominal pain.  Postop. EXAM: CT ABDOMEN AND PELVIS WITH CONTRAST TECHNIQUE: Multidetector CT imaging of the abdomen and pelvis was performed using the standard protocol following bolus administration of intravenous contrast. RADIATION DOSE REDUCTION: This exam was performed according to the departmental dose-optimization program which includes automated exposure control, adjustment of the mA and/or kV according to patient size and/or use of iterative reconstruction technique. CONTRAST:  75mL OMNIPAQUE IOHEXOL 350 MG/ML SOLN COMPARISON:  05/09/2023. FINDINGS: Lower chest: Mild dependent atelectasis, left greater than right, left improved from the prior CT. Hepatobiliary: Normal liver size and overall attenuation. Scattered small cysts, stable. Prior cholecystectomy. No bile duct dilation. Pancreas: Unremarkable. Spleen: Normal. Adrenals/Urinary Tract: No adrenal mass. Stable lower pole left renal cyst, with no follow-up recommended. Kidneys with symmetric enhancement and excretion. No hydronephrosis. Ureters normal in course and in caliber. Bladder mildly distended, unremarkable. Stomach/Bowel: Status post left colon resection, formation of a Hartmann's pouch and a left lower quadrant colostomy. Postsurgical lung inflammatory changes are noted in the low abdomen and pelvis with associated surgical drains, stable from the previous CT. Small collection lies superior and to  the right of the Hartmann's pouch measuring 3.8 x 2.2 cm, 3.6 x 1.6 cm on the prior CT. There are no other defined collections. Small-bowel dilation with air-fluid levels, with less distension than on the prior CT. No small bowel wall thickening. No colonic dilation, wall thickening or inflammation. Vascular/Lymphatic: Aortic atherosclerosis. No aneurysm. No enlarged lymph nodes. Reproductive: Status post hysterectomy. Other: No free air.  Open midline incision to the fascia. Musculoskeletal: No acute findings or change from the prior CT. IMPRESSION: 1. Postsurgical changes related to left colon resection, formation of a left lower quadrant colostomy and formation of a Hartmann's pouch. Small collection adjacent to the pouch is minimally larger than on the prior CT. No other defined collections. 2. Small-bowel dilation with air-fluid levels, consistent with an ileus, mildly improved from the prior CT. 3. Aortic atherosclerosis. Aortic Atherosclerosis (ICD10-I70.0). Electronically Signed   By: Amie Portland M.D.   On: 05/16/2023 13:00     LOS: 25 days   Jeoffrey Massed, MD  [facility]    To contact the attending provider between 7A-7P or the covering provider during after hours 7P-7A, please log into the web site [email or web address] and access using universal [place] password for that web site. If you do not have the password, please call the hospital operator.  05/17/2023, 9:31 AM

## 2023-05-17 NOTE — TOC Progression Note (Signed)
Transition of Care [facility]) - Progression Note    Patient Details  Name: Kathleen [name] MRN: [id number] Date of Birth: 1946/04/20  Transition of Care [facility]) CM/SW Contact  Mearl Latin, LCSW Phone Number: 05/17/2023, 8:46 AM  Clinical Narrative:    CSW continuing to follow for medical stability.    Expected Discharge Plan: Skilled Nursing Facility Barriers to Discharge: SNF Pending bed offer, SNF Pending discharge orders, SNF Pending transportation, SNF Pending discharge summary, Continued Medical Work up  Expected Discharge Plan and Services In-house Referral: Clinical Social Work   Post Acute Care Choice: Skilled Nursing Facility Living arrangements for the past 2 months: Independent Living Facility ([facility])                                       Social Determinants of Health (SDOH) Interventions SDOH Screenings   Food Insecurity: No Food Insecurity (04/22/2023)  Housing: Low Risk  (04/22/2023)  Transportation Needs: No Transportation Needs (04/22/2023)  Utilities: Not At Risk (04/22/2023)  Alcohol Screen: Low Risk  (05/21/2022)  Depression (PHQ2-9): Low Risk  (05/21/2022)  Financial Resource Strain: Low Risk  (05/21/2022)  Physical Activity: Insufficiently Active (05/21/2022)  Social Connections: Moderately Isolated (05/21/2022)  Stress: No Stress Concern Present (05/21/2022)  Tobacco Use: Medium Risk (04/27/2023)    Readmission Risk Interventions    04/26/2023    9:51 AM 01/14/2023   11:16 AM 05/04/2022    3:37 PM  Readmission Risk Prevention Plan  Transportation Screening Complete Complete Complete  PCP or Specialist Appt within 5-7 Days  Complete Complete  PCP or Specialist Appt within 3-5 Days Complete    Home Care Screening  Complete Complete  Medication Review (RN CM)  Complete Complete  HRI or Home Care Consult Complete    Palliative Care Screening --    Medication Review (RN Care Manager) Complete

## 2023-05-17 NOTE — Progress Notes (Signed)
No ICM remote transmission received for 05/17/2023 due to hospitalization and next ICM transmission scheduled for 06/14/2023.

## 2023-05-18 DIAGNOSIS — K5720 Diverticulitis of large intestine with perforation and abscess without bleeding: Secondary | ICD-10-CM | POA: Diagnosis not present

## 2023-05-18 DIAGNOSIS — E1140 Type 2 diabetes mellitus with diabetic neuropathy, unspecified: Secondary | ICD-10-CM | POA: Diagnosis not present

## 2023-05-18 DIAGNOSIS — E039 Hypothyroidism, unspecified: Secondary | ICD-10-CM | POA: Diagnosis not present

## 2023-05-18 DIAGNOSIS — E440 Moderate protein-calorie malnutrition: Secondary | ICD-10-CM | POA: Diagnosis not present

## 2023-05-18 LAB — CBC
HCT: 23 % — ABNORMAL LOW (ref 36.0–46.0)
Hemoglobin: 7 g/dL — ABNORMAL LOW (ref 12.0–15.0)
MCH: 25.8 pg — ABNORMAL LOW (ref 26.0–34.0)
MCHC: 30.4 g/dL (ref 30.0–36.0)
MCV: 84.9 fL (ref 80.0–100.0)
Platelets: 215 10*3/uL (ref 150–400)
RBC: 2.71 MIL/uL — ABNORMAL LOW (ref 3.87–5.11)
RDW: 17.7 % — ABNORMAL HIGH (ref 11.5–15.5)
WBC: 9.4 10*3/uL (ref 4.0–10.5)
nRBC: 0.7 % — ABNORMAL HIGH (ref 0.0–0.2)

## 2023-05-18 LAB — TYPE AND SCREEN
ABO/RH(D): A POS
Antibody Screen: NEGATIVE
Unit division: 0

## 2023-05-18 LAB — HEPARIN LEVEL (UNFRACTIONATED): Heparin Unfractionated: 0.46 [IU]/mL (ref 0.30–0.70)

## 2023-05-18 LAB — PREPARE RBC (CROSSMATCH)

## 2023-05-18 LAB — BPAM RBC
Blood Product Expiration Date: 202412142359
Unit Type and Rh: 600

## 2023-05-18 MED ORDER — FAT EMUL FISH OIL/PLANT BASED 20% (SMOFLIPID)IV EMUL
250.0000 mL | INTRAVENOUS | Status: AC
  Administered 2023-05-18: 250 mL via INTRAVENOUS
  Filled 2023-05-18: qty 250

## 2023-05-18 MED ORDER — CLINIMIX E/DEXTROSE (8/10) 8 % IV SOLN
INTRAVENOUS | Status: AC
Start: 2023-05-18 — End: 2023-05-19
  Filled 2023-05-18 (×2): qty 1000

## 2023-05-18 MED ORDER — SODIUM CHLORIDE 0.9% IV SOLUTION
Freq: Once | INTRAVENOUS | Status: DC
Start: 2023-05-18 — End: 2023-05-27

## 2023-05-18 MED ORDER — MAGNESIUM OXIDE -MG SUPPLEMENT 400 (240 MG) MG PO TABS
400.0000 mg | ORAL_TABLET | Freq: Two times a day (BID) | ORAL | Status: DC
  Administered 2023-05-18 – 2023-05-27 (×18): 400 mg via ORAL
  Filled 2023-05-18 (×18): qty 1

## 2023-05-18 NOTE — Progress Notes (Signed)
PHARMACY - TOTAL PARENTERAL NUTRITION CONSULT NOTE  Indication: Prolonged ileus  Patient Measurements: Height: 5' (152.4 cm) Weight: 86.9 kg (191 lb 9.3 oz) IBW/kg (Calculated) : 45.5 TPN AdjBW (KG): 54 Body mass index is 37.42 kg/m. Weight trends: 79.3kg (11/21) >>82.9 kg (11/30)  Assessment:  53 YOF with recurrent sigmoid diverticulitis and associated colovaginal fistula, HFrEF, COPD, Afib with PPM, and gout who was admitted with persistent symptoms with multiple abscesses s/p ex lap with sigmoid colectomy and end left colostomy on 11/19. Patient has been NPO since surgery. Pharmacy consulted to manage TPN.  Weights have been stable in the last year ~182-194 lbs. Patient doesn't eat much at home, only 2 meals a day. For breakfast she might eat grits, an egg and coffee. For dinner she cooks her own food (doesn't like the food at facility) and might include chicken, green beans, carrots and rice. She does drink chocolate Boost regularly and thinks Boost tastes better than Ensure. She does not like vanilla or berry flavored supplements. Unfortunately, we only have Boost in wild berry.  Glucose / Insulin:  BG < 180, off SSI -hx T2DM, A1c 6.2%; no meds PTA   Electrolytes: Na 134, K 3.6, Mg 1.6 (labs Mon, [place])  Renal: SCr 0.66, BUN 28 Hepatic: AST 10 low, ALT/tbili WNL, TG 135, albumin 2.2 Intake / Output; MIVF: metoclopramide 12/2>> 12/8; UOP (0.6 mL/kg/hr), on torsemide 40mg  po and spironolactone 25mg . JP drain 45mL, colostomy  GI Imaging: 11/21 KUB moderately dilated loops of small bowel, c/w post-operative ileus 11/23 KUB: persistent SBO or ileus, no significant change from prior 11/25 KUB: bowel obstruction or ileus, pleural effusion vs atelectasis 11/25 CTAP: post-op ileus or distal SBO, trace L pleural effusion 11/26 CXR: L > R lower lobe atelectasis 11/29 Abd XR: distention of SB concerning for obstruction or worsening postop ileus 12/1 CT: mild ileus 12/6 KUB:  improved but not normalized ileus-like bowel gas pattern 12/8 CT:  Small-bowel dilation with air-fluid levels, consistent with ileus, mildly improved from the prior  GI Surgeries / Procedures:  11/19 ex lap with sigmoid colectomy and end left colostomy  Central access: PICC placed 04/30/23 TPN start date: 04/30/23  Nutritional Goals: Clinimix E 8/10 1L at 42 ml/hr with SMOF lipids on MTWThFSa Clinimix E 8/10 2L at 82 ml/hr on Sun with SMOFlipids - Average of 1258 kCals (~80% of goal) and 91g AA per day (100% goal) Previously on D10 fluids for add'l kCal >> stopped on 11/26 given concerns for pleural effusions/atelectasis noted on imaging, weights increased requiring Lasix IV - this has resolved so restarted D10 at 11/29, then changed to sodium bicarb D5 gtt   RD Estimated Needs Total Energy Estimated Needs: 1600-1800 kcal Total Protein Estimated Needs: 75-95 gm Total Fluid Estimated Needs: >1.6L  Current Nutrition:  TPN 12/5 CLD with worsening abd pain and N/V 12/6 NPO: bloated, + flatus; restart metoclopramide  12/7 FLD: ate 1-2 bites grits (too watery for her), sorbet, coffee 12/8 FLD: ate half bowl of grits, whole tomato soup, 1 jello, lemonade. Patient doesn't like vanilla flavored things, does drink Boost at home. Patient has had constant RUQ abdominal pain for the past few days that suddenly worsens at times. She's not sure what makes it worse, maybe movement; it is not correlated with eating. Patient would like to have high protein Chocolate Boost  12/9 FLD: no appetite but abd pain is better. States she's not a breakfast person. Will try to drink protein supplement though she's  doesn't like the flavor and Max protein Ensure.  12/10 FLD > Soft. Nausea overnight, 1 dose of Zofran without much relief per patient. Spoke with provider and patient - working to advance diet. Drinking most of nutritional supplements. Not ready to half TPN yet.    Plan:  Clinimix 8/10E with lytes  1L at 42 mL/hr on MTuWThFriSa Clinimix 8/10E with lytes 2L at 82 mL/hr on Sun SMOFlipids over 12 hrs daily  TPN providing an average of 1258 kCals (~80% of goal) and 91g AA per day (100% goal) Electrolytes in Clinimix E 8/10 1L bag: Na 35 mEq, K 30 mEq, Mag 5 mEq, Ca 4.5 mEq, Phos , Acetate 83, Cl 76 mEq Electrolytes in Clinimix E 8/10 2L bag: Na 70 mEq, K 60 mEq, Mag 10 mEq, Ca 9 mEq, Phos 30 mmol, QM:VHQI 1:1 No MVI or trace elements in Clinimix, give MVI by mouth  Monitor TPN labs Mon/Thur and PRN  Continue Chocolate Ensure max and oral protein supplement  F/U oral intake, wean off TPN as able  Thank you for involving pharmacy in this patient's care.  Cedric Fishman, PharmD, BCPS, BCCCP Clinical Pharmacist

## 2023-05-18 NOTE — Progress Notes (Signed)
Patient ID: Kathleen [name], female   DOB: 1945-11-26, 77 y.o.   MRN: [id number] [facility] Surgery Progress Note  21 Days Post-Op  Subjective: CC-  Continues to have intermittent abdominal pain. Her pain is more in the left hemiabdomen today. Some nausea, no emesis. Tolerating fulls but not taking in much. Offered Ensure x2 but did not drink all of them. Ostomy productive. Hgb 7, getting 1u PRBCs this morning.  Objective: Vital signs in last 24 hours: Temp:  [97.6 F (36.4 C)-97.9 F (36.6 C)] 97.6 F (36.4 C) (12/10 0800) Pulse Rate:  [86-92] 92 (12/10 0800) Resp:  [15-21] 21 (12/10 0800) BP: (92-143)/(42-73) 107/42 (12/10 0800) SpO2:  [100 %] 100 % (12/10 0732) Weight:  [86.9 kg] 86.9 kg (12/10 0500) Last BM Date : 05/17/23  Intake/Output from previous day: 12/09 0701 - 12/10 0700 In: 120 [P.O.:120] Out: 1385 [Urine:1200; Drains:35; Stool:150] Intake/Output this shift: No intake/output data recorded.  PE: Gen:  Alert, NAD Card:  RRR Pulm:  rate and effort normal on supplemental O2 via [place] Abd: soft, obese, mild left sided tenderness, open midline stable with few areas of breakdown and separation but fascia appears intact/ otherwise mostly healthy pink granulation tissue, Stoma viable but difficult to visualize through pouch, thin brown stool in bag. JP#1 and #2 scant cloudy/serous fluid  Lab Results:  Recent Labs    05/17/23 0306 05/18/23 0419  WBC 8.1 9.4  HGB 7.2* 7.0*  HCT 23.8* 23.0*  PLT 210 215   BMET Recent Labs    05/16/23 0630 05/17/23 0306  NA 131* 134*  K 3.6 3.6  CL 97* 101  CO2 23 24  GLUCOSE 166* 165*  BUN 21 28*  CREATININE 0.63 0.66  CALCIUM 8.8* 8.7*   PT/INR No results for input(s): "LABPROT", "INR" in the last 72 hours. CMP     Component Value Date/Time   NA 134 (L) 05/17/2023 0306   NA 130 (L) 12/28/2012 0748   K 3.6 05/17/2023 0306   K 4.6 12/28/2012 0748   CL 101 05/17/2023 0306   CL 95 (L) 12/28/2012 0748   CO2 24  05/17/2023 0306   CO2 23 12/28/2012 0748   GLUCOSE 165 (H) 05/17/2023 0306   GLUCOSE 391 (H) 12/28/2012 0748   BUN 28 (H) 05/17/2023 0306   BUN 32 (H) 12/28/2012 0748   CREATININE 0.66 05/17/2023 0306   CREATININE 0.86 01/27/2023 1137   CALCIUM 8.7 (L) 05/17/2023 0306   CALCIUM 14.3 (HH) 06/26/2019 1618   PROT 5.2 (L) 05/17/2023 0306   ALBUMIN 2.2 (L) 05/17/2023 0306   AST 10 (L) 05/17/2023 0306   ALT 10 05/17/2023 0306   ALKPHOS 49 05/17/2023 0306   BILITOT 0.5 05/17/2023 0306   GFRNONAA >60 05/17/2023 0306   GFRNONAA 43 (L) 12/28/2012 0748   GFRAA 58 (L) 06/28/2019 0427   GFRAA 50 (L) 12/28/2012 0748   Lipase     Component Value Date/Time   LIPASE 30 04/22/2023 1840       Studies/Results: CT ABDOMEN PELVIS W CONTRAST  Result Date: 05/16/2023 CLINICAL DATA:  Abdominal pain.  Postop. EXAM: CT ABDOMEN AND PELVIS WITH CONTRAST TECHNIQUE: Multidetector CT imaging of the abdomen and pelvis was performed using the standard protocol following bolus administration of intravenous contrast. RADIATION DOSE REDUCTION: This exam was performed according to the departmental dose-optimization program which includes automated exposure control, adjustment of the mA and/or kV according to patient size and/or use of iterative reconstruction technique. CONTRAST:  75mL OMNIPAQUE IOHEXOL  350 MG/ML SOLN COMPARISON:  05/09/2023. FINDINGS: Lower chest: Mild dependent atelectasis, left greater than right, left improved from the prior CT. Hepatobiliary: Normal liver size and overall attenuation. Scattered small cysts, stable. Prior cholecystectomy. No bile duct dilation. Pancreas: Unremarkable. Spleen: Normal. Adrenals/Urinary Tract: No adrenal mass. Stable lower pole left renal cyst, with no follow-up recommended. Kidneys with symmetric enhancement and excretion. No hydronephrosis. Ureters normal in course and in caliber. Bladder mildly distended, unremarkable. Stomach/Bowel: Status post left colon resection,  formation of a Hartmann's pouch and a left lower quadrant colostomy. Postsurgical lung inflammatory changes are noted in the low abdomen and pelvis with associated surgical drains, stable from the previous CT. Small collection lies superior and to the right of the Hartmann's pouch measuring 3.8 x 2.2 cm, 3.6 x 1.6 cm on the prior CT. There are no other defined collections. Small-bowel dilation with air-fluid levels, with less distension than on the prior CT. No small bowel wall thickening. No colonic dilation, wall thickening or inflammation. Vascular/Lymphatic: Aortic atherosclerosis. No aneurysm. No enlarged lymph nodes. Reproductive: Status post hysterectomy. Other: No free air.  Open midline incision to the fascia. Musculoskeletal: No acute findings or change from the prior CT. IMPRESSION: 1. Postsurgical changes related to left colon resection, formation of a left lower quadrant colostomy and formation of a Hartmann's pouch. Small collection adjacent to the pouch is minimally larger than on the prior CT. No other defined collections. 2. Small-bowel dilation with air-fluid levels, consistent with an ileus, mildly improved from the prior CT. 3. Aortic atherosclerosis. Aortic Atherosclerosis (ICD10-I70.0). Electronically Signed   By: Amie Portland M.D.   On: 05/16/2023 13:00    Anti-infectives: Anti-infectives (From admission, onward)    Start     Dose/Rate Route Frequency Ordered Stop   05/10/23 1000  piperacillin-tazobactam (ZOSYN) IVPB 3.375 g  Status:  Discontinued        3.375 g 12.5 mL/hr over 240 Minutes Intravenous Every 8 hours 05/10/23 0914 05/17/23 1317   05/03/23 1400  piperacillin-tazobactam (ZOSYN) IVPB 3.375 g        3.375 g 12.5 mL/hr over 240 Minutes Intravenous Every 8 hours 05/03/23 0935 05/07/23 0124   04/27/23 1021  sodium chloride 0.9 % with cefoTEtan (CEFOTAN) ADS Med       Note to Pharmacy: Shanda Bumps M: cabinet override      04/27/23 1021 04/27/23 1253   04/26/23 0800   cefoTEtan (CEFOTAN) 2 g in sodium chloride 0.9 % 100 mL IVPB  Status:  Discontinued        2 g 200 mL/hr over 30 Minutes Intravenous To ShortStay Surgical 04/23/23 2111 04/27/23 0800   04/22/23 2300  piperacillin-tazobactam (ZOSYN) IVPB 3.375 g        3.375 g 12.5 mL/hr over 240 Minutes Intravenous Every 8 hours 04/22/23 2256 05/02/23 2359   04/22/23 1900  piperacillin-tazobactam (ZOSYN) IVPB 3.375 g        3.375 g 100 mL/hr over 30 Minutes Intravenous  Once 04/22/23 1852 04/22/23 1954        Assessment/Plan POD#21 S/p Exploratory laparotomy, sigmoid colectomy, colostomy 11/19/24Dwain Sarna - CT 12/8 fairly unchanged with mild ileus and small 3.8x2.2cm fluid collection near colonic stump.  JP drain x2 only cloudy/serous today. Continue drains. - WBC WNL, afebrile. Off antibiotics - Continues to have some pain and nausea. No emesis and having more colostomy output. Advance to soft diet. Encourage protein shakes. May start to wean TPN tomorrow if she is tolerating this. - WOC following for  new ostomy. Stoma with some mucocutaneous separation, monitor. Will need ostomy clinic referral at discharge  - vac unable to hold seal and was removed, continue BID wet to dry dressing changes - oob, pulm toilet   ID zosyn 11/14>>11/29, 12/2>>12/9 VTE - heparin heparin gtt for afib  FEN - TPN, soft, Ensure    LOS: 26 days    Franne Forts, [facility] Surgery 05/18/2023, 8:48 AM Please see Amion for pager number during day hours 7:00am-4:30pm

## 2023-05-18 NOTE — Progress Notes (Signed)
PROGRESS NOTE        PATIENT DETAILS Name: Kathleen [name] Age: 77 y.o. Sex: female Date of Birth: 03/28/1946 Admit Date: 04/22/2023 Admitting Physician Gery Pray, MD [id number], Bernadene Bell, MD  Brief Summary:  Patient is a 77 y.o.  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  Admitted by [facility]-started on empiric IV antibiotics-underwent preop assessment by cardiology-and then subsequently underwent  exploratory laparotomy with sigmoid colectomy and end ostomy on 11/19.  Postoperative course complicated by ileus-requiring NG tube decompression-and TNA for nutrition.  See below for further details.  Significant events: 11/14>> admit to [facility] 11/19>>exploratory laparotomy with sigmoid colectomy and end ostomy  12/04>> NG removed-clear liquid started  Significant studies: 11/14>> CT abdomen/pelvis: Worsening signal right diverticulitis-localized perforation-5.6 cm perirectal abscess, 6.0 cm left lower quadrant abscess. 11/22>> x-ray abdomen: Consistent with postoperative ileus. 11/25>> CT abdomen/pelvis: No abscess 12/01>> CT abdomen/pelvis: Mild small bowel ileus-no obvious abscess-small fluid collection in the colonic stump  Significant microbiology data: 11/14>> blood culture: No growth 11/14>> COVID/influenza/RSV PCR: Negative  Procedures: 11/19>>exploratory laparotomy with sigmoid colectomy  end ostomy on 11/19  Consults: Cardiology GI General surgery  Subjective: Some abdominal pain continues-dark brown stools in ostomy.  Objective: Vitals: Blood pressure (!) 107/42, pulse 92, temperature 97.6 F (36.4 C), temperature source Oral, resp. rate (!) 21, height 5' (1.524 m), weight 86.9 kg, SpO2 100%.   Exam: Gen Exam:Alert awake-not in any  distress HEENT:atraumatic, normocephalic Chest: B/L clear to auscultation anteriorly CVS:S1S2 regular Abdomen:soft mildly tender in the mid abdominal area-nondistended-dark brown stools in ostomy.  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  Normocytic anemia Secondary to critical illness No evidence of blood loss Hb stable trending down-down to 7.0 today-transfuse 1 unit of PRBC.  Persistent atrial fibrillation Telemetry monitoring Tikosyn orally IV heparin-probably could be switched to Eliquis in the next day or so. 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  GERD PPI  DM-2 (A1c 6.2 on 11/15) CBGs stable on SSI  No results for input(s): "GLUCAP" in the last 72 hours.  Nutrition Status: Nutrition Problem: Moderate Malnutrition Etiology: chronic illness Signs/Symptoms: mild fat depletion, mild muscle depletion, energy intake < 75% for > 7 days Interventions: Ensure Enlive (each supplement provides 350kcal and 20 grams of protein), Magic cup, MVI  Obesity: Estimated body mass index is 37.42 kg/m as calculated from the following:   Height as of this encounter: 5' (1.524 m).   Weight as of this encounter: 86.9 kg.   Code status:   Code Status: Limited: Do not attempt resuscitation (DNR) -DNR-LIMITED -Do Not Intubate/DNI    DVT Prophylaxis: IV heparin   Family Communication: None at bedside.   Disposition Plan: Status is: Inpatient Remains inpatient appropriate because: Severity of illness   Planned Discharge Destination:Skilled nursing facility   Diet: Diet Order             DIET SOFT Fluid consistency: Thin  Diet effective now                     Antimicrobial agents: Anti-infectives (From admission, onward)    Start     Dose/Rate Route Frequency Ordered Stop   05/10/23 1000  piperacillin-tazobactam (ZOSYN) IVPB 3.375 g  Status:  Discontinued        3.375 g 12.5 mL/hr over 240 Minutes Intravenous Every 8 hours 05/10/23 0914 05/17/23 1317   05/03/23 1400  piperacillin-tazobactam (ZOSYN) IVPB 3.375 g        3.375 g 12.5 mL/hr over 240 Minutes Intravenous Every 8 hours 05/03/23 0935 05/07/23 0124   04/27/23 1021  sodium chloride 0.9 % with cefoTEtan (CEFOTAN) ADS Med       Note to Pharmacy: Shanda Bumps M: cabinet override      04/27/23 1021 04/27/23 1253   04/26/23 0800  cefoTEtan (CEFOTAN) 2 g in sodium chloride 0.9 % 100 mL IVPB  Status:  Discontinued        2 g 200 mL/hr over 30 Minutes Intravenous To ShortStay Surgical 04/23/23  2111 04/27/23 0800   04/22/23 2300  piperacillin-tazobactam (ZOSYN) IVPB 3.375 g        3.375 g 12.5 mL/hr over 240 Minutes Intravenous Every 8 hours 04/22/23 2256 05/02/23 2359   04/22/23 1900  piperacillin-tazobactam (ZOSYN) IVPB 3.375 g        3.375 g 100 mL/hr over 30 Minutes Intravenous  Once 04/22/23 1852 04/22/23 1954        MEDICATIONS: Scheduled Meds:  (feeding supplement) PROSource Plus  30 mL Oral BID BM   sodium chloride   Intravenous Once   acetaminophen  650 mg Oral Q6H   Chlorhexidine Gluconate Cloth  6 each Topical Daily   docusate sodium  100 mg Oral BID   dofetilide  250 mcg Oral Q12H   feeding supplement  237 mL Oral TID BM   fluticasone furoate-vilanterol  1 puff Inhalation Daily   gabapentin  200 mg Oral QHS   guaiFENesin  600 mg Oral BID   lidocaine  1 patch Transdermal Q24H   magic mouthwash  5 mL Oral QID   methocarbamol  500 mg Oral Q6H   multivitamin with minerals  1 tablet Oral Daily   nystatin   Topical BID   mouth rinse  15 mL Mouth Rinse 4 times per day  pantoprazole  40 mg Oral BID   Ensure Max Protein  11 oz Oral BID BM   revefenacin  175 mcg Nebulization Daily   spironolactone  25 mg Oral Daily   torsemide  40 mg Oral Daily   Continuous Infusions:  TPN (CLINIMIX-E) Adult     And   fat emul(SMOFlipid)     heparin 1,900 Units/hr (05/17/23 1727)   promethazine (PHENERGAN) injection (IM or IVPB) Stopped (05/11/23 1422)   TPN (CLINIMIX-E) Adult 42 mL/hr at 05/17/23 1805   PRN Meds:.albuterol, HYDROmorphone (DILAUDID) injection, ondansetron (ZOFRAN) IV, oxyCODONE, promethazine (PHENERGAN) injection (IM or IVPB), sodium chloride flush   I have personally reviewed following labs and imaging studies  LABORATORY DATA: CBC: Recent Labs  Lab 05/14/23 0838 05/15/23 0409 05/16/23 0227 05/17/23 0306 05/18/23 0419  WBC 8.0 7.9 8.2 8.1 9.4  HGB 7.4* 7.5* 7.5* 7.2* 7.0*  HCT 25.0* 24.7* 24.6* 23.8* 23.0*  MCV 85.0 85.5 86.0 86.2 84.9  PLT  204 216 217 210 215    Basic Metabolic Panel: Recent Labs  Lab 05/12/23 0342 05/13/23 0407 05/14/23 0847 05/16/23 0630 05/17/23 0306  NA 135 131* 133* 131* 134*  K 3.4* 3.6 4.3 3.6 3.6  CL 107 102 104 97* 101  CO2 22 21* 21* 23 24  GLUCOSE 150* 150* 158* 166* 165*  BUN 15 13 13 21  28*  CREATININE 0.47 0.46 0.47 0.63 0.66  CALCIUM 7.9* 8.1* 8.7* 8.8* 8.7*  MG  --  1.9 1.7  --  1.6*  PHOS  --  2.6  --   --  3.9    GFR: Estimated Creatinine Clearance: 58.7 mL/min (by C-G formula based on SCr of 0.66 mg/dL).  Liver Function Tests: Recent Labs  Lab 05/13/23 0407 05/16/23 0630 05/17/23 0306  AST 12* 12* 10*  ALT 13 11 10   ALKPHOS 66 58 49  BILITOT 0.5 0.6 0.5  PROT 5.0* 5.5* 5.2*  ALBUMIN 2.2* 2.4* 2.2*   No results for input(s): "LIPASE", "AMYLASE" in the last 168 hours. No results for input(s): "AMMONIA" in the last 168 hours.  Coagulation Profile: No results for input(s): "INR", "PROTIME" in the last 168 hours.  Cardiac Enzymes: No results for input(s): "CKTOTAL", "CKMB", "CKMBINDEX", "TROPONINI" in the last 168 hours.  BNP (last 3 results) No results for input(s): "PROBNP" in the last 8760 hours.  Lipid Profile: Recent Labs    05/17/23 0306  TRIG 135      Thyroid Function Tests: No results for input(s): "TSH", "T4TOTAL", "FREET4", "T3FREE", "THYROIDAB" in the last 72 hours.  Anemia Panel: No results for input(s): "VITAMINB12", "FOLATE", "FERRITIN", "TIBC", "IRON", "RETICCTPCT" in the last 72 hours.  Urine analysis:    Component Value Date/Time   COLORURINE AMBER (A) 04/23/2023 0623   APPEARANCEUR CLOUDY (A) 04/23/2023 0623   APPEARANCEUR Hazy 12/25/2012 1653   LABSPEC 1.024 04/23/2023 0623   LABSPEC 1.021 12/25/2012 1653   PHURINE 5.0 04/23/2023 0623   GLUCOSEU NEGATIVE 04/23/2023 0623   GLUCOSEU NEGATIVE 03/17/2023 1003   HGBUR MODERATE (A) 04/23/2023 0623   BILIRUBINUR NEGATIVE 04/23/2023 0623   BILIRUBINUR Negative 12/25/2012 1653    KETONESUR NEGATIVE 04/23/2023 0623   PROTEINUR 30 (A) 04/23/2023 0623   UROBILINOGEN 0.2 03/17/2023 1003   NITRITE NEGATIVE 04/23/2023 0623   LEUKOCYTESUR LARGE (A) 04/23/2023 0623   LEUKOCYTESUR Negative 12/25/2012 1653    Sepsis Labs: Lactic Acid, Venous    Component Value Date/Time   LATICACIDVEN 0.8 04/23/2023 0114    MICROBIOLOGY: No results found  for this or any previous visit (from the past 240 hour(s)).   RADIOLOGY STUDIES/RESULTS: CT ABDOMEN PELVIS W CONTRAST  Result Date: 05/16/2023 CLINICAL DATA:  Abdominal pain.  Postop. EXAM: CT ABDOMEN AND PELVIS WITH CONTRAST TECHNIQUE: Multidetector CT imaging of the abdomen and pelvis was performed using the standard protocol following bolus administration of intravenous contrast. RADIATION DOSE REDUCTION: This exam was performed according to the departmental dose-optimization program which includes automated exposure control, adjustment of the mA and/or kV according to patient size and/or use of iterative reconstruction technique. CONTRAST:  75mL OMNIPAQUE IOHEXOL 350 MG/ML SOLN COMPARISON:  05/09/2023. FINDINGS: Lower chest: Mild dependent atelectasis, left greater than right, left improved from the prior CT. Hepatobiliary: Normal liver size and overall attenuation. Scattered small cysts, stable. Prior cholecystectomy. No bile duct dilation. Pancreas: Unremarkable. Spleen: Normal. Adrenals/Urinary Tract: No adrenal mass. Stable lower pole left renal cyst, with no follow-up recommended. Kidneys with symmetric enhancement and excretion. No hydronephrosis. Ureters normal in course and in caliber. Bladder mildly distended, unremarkable. Stomach/Bowel: Status post left colon resection, formation of a Hartmann's pouch and a left lower quadrant colostomy. Postsurgical lung inflammatory changes are noted in the low abdomen and pelvis with associated surgical drains, stable from the previous CT. Small collection lies superior and to the right of the  Hartmann's pouch measuring 3.8 x 2.2 cm, 3.6 x 1.6 cm on the prior CT. There are no other defined collections. Small-bowel dilation with air-fluid levels, with less distension than on the prior CT. No small bowel wall thickening. No colonic dilation, wall thickening or inflammation. Vascular/Lymphatic: Aortic atherosclerosis. No aneurysm. No enlarged lymph nodes. Reproductive: Status post hysterectomy. Other: No free air.  Open midline incision to the fascia. Musculoskeletal: No acute findings or change from the prior CT. IMPRESSION: 1. Postsurgical changes related to left colon resection, formation of a left lower quadrant colostomy and formation of a Hartmann's pouch. Small collection adjacent to the pouch is minimally larger than on the prior CT. No other defined collections. 2. Small-bowel dilation with air-fluid levels, consistent with an ileus, mildly improved from the prior CT. 3. Aortic atherosclerosis. Aortic Atherosclerosis (ICD10-I70.0). Electronically Signed   By: Amie Portland M.D.   On: 05/16/2023 13:00     LOS: 26 days   Jeoffrey Massed, MD  [facility]    To contact the attending provider between 7A-7P or the covering provider during after hours 7P-7A, please log into the web site [email or web address] and access using universal  password for that web site. If you do not have the password, please call the hospital operator.  05/18/2023, 10:44 AM

## 2023-05-18 NOTE — Plan of Care (Signed)
Pt has rested  quietly throughout the night with no distress noted. Alert and oriented. On O2 3LNC. A-V paced on monitor. Right upper arm PICC intact with dressing CDI. TPN, lipids and heparin infusing. Midline dressing intact and changed as ordered, pt tolerated well. LLQ colostomy intact/ green stool. 2 JP drains remain intact with very little drainage. Up to [facility] to void. Medicated twice for pain with relief noted and once for nausea with relief noted. Pt tearful at times. No other complaints voiced.     Problem: Nutritional: Goal: Maintenance of adequate nutrition will improve Outcome: Progressing   Problem: Education: Goal: Knowledge of General Education information will improve Description: Including pain rating scale, medication(s)/side effects and non-pharmacologic comfort measures Outcome: Progressing   Problem: Clinical Measurements: Goal: Diagnostic test results will improve Outcome: Progressing   Problem: Nutrition: Goal: Adequate nutrition will be maintained Outcome: Progressing   Problem: Pain Management: Goal: General experience of comfort will improve Outcome: Progressing

## 2023-05-18 NOTE — Progress Notes (Signed)
Physical Therapy Treatment Patient Details Name: ELYCIA ROTHSCHILD MRN: [id number] DOB: October 05, 1945 Today's Date: 05/18/2023   History of Present Illness 77 y.o. 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 S/p exploratory laparotomy with sigmoid colectomy and end left colostomy by Dr. Dwain Sarna on 04/27/2023.  PMHx: colovaginal fistula associated with diverticulitis, obesity, OSA , BOOP, tracheobronchomalacia, chronic respiratory failure on 4 to 5 L oxygen at baseline, persistent A-fib S/P pacemaker implant 2017, chronic systolic heart failure, recent admission from 04/09/2023 to 04/13/2023.    PT Comments  Pt with fair tolerance to treatment today. Pt today was able to progress ambulation in hallway with rollator CGA despite being very anxious, labile, and reporting 10/10 back pain. No change in DC/DME recs at this time.  PT will continue to follow.    If plan is discharge home, recommend the following: A lot of help with walking and/or transfers;A lot of help with bathing/dressing/bathroom;Assistance with cooking/housework;Assist for transportation   Can travel by private vehicle     No  Equipment Recommendations  None recommended by PT    Recommendations for Other Services       Precautions / Restrictions Precautions Precautions: Fall Precaution Comments: multiple drains Restrictions Weight Bearing Restrictions: No     Mobility  Bed Mobility Overal bed mobility: Needs Assistance Bed Mobility: Sit to Supine, Rolling, Sidelying to Sit Rolling: Supervision Sidelying to sit: Supervision   Sit to supine: Min assist   General bed mobility comments: Min A for BLEs, pt with good trunk control to rotate body simultaneously as legs move    Transfers Overall transfer level: Needs assistance Equipment used: Rollator (4 wheels) Transfers: Sit to/from Stand Sit to Stand: Contact guard assist           General transfer comment: Pt with  use of brakes and good hand placement. CGA for safety and lines and leads    Ambulation/Gait Ambulation/Gait assistance: Contact guard assist Gait Distance (Feet): 30 Feet Assistive device: Rollator (4 wheels) Gait Pattern/deviations: Step-through pattern, Trunk flexed, Decreased stride length Gait velocity: decreased     General Gait Details: slow steady gait with decreased B foot clearance   Stairs             Wheelchair Mobility     Tilt Bed    Modified Rankin (Stroke Patients Only)       Balance Overall balance assessment: Needs assistance Sitting-balance support: No upper extremity supported, Feet supported Sitting balance-Leahy Scale: Good     Standing balance support: During functional activity, Single extremity supported Standing balance-Leahy Scale: Fair Standing balance comment: Pt requires UE support for balance                            Cognition Arousal: Alert Behavior During Therapy: Anxious, Lability Overall Cognitive Status: Within Functional Limits for tasks assessed                                 General Comments: anxious with OOB mobility. Labile at times        Exercises      General Comments General comments (skin integrity, edema, etc.): VSS on 4L      Pertinent Vitals/Pain Pain Assessment Pain Assessment: 0-10 Pain Score: 10-Worst pain ever Pain Location: back Pain Descriptors / Indicators: Grimacing, Discomfort, Crying Pain Intervention(s): Monitored during session, Patient requesting pain meds-RN notified, Repositioned, Limited  activity within patient's tolerance    Home Living                          Prior Function            PT Goals (current goals can now be found in the care plan section) Progress towards PT goals: Progressing toward goals    Frequency    Min 1X/week      PT Plan      Co-evaluation              AM-PAC PT "6 Clicks" Mobility   Outcome  Measure  Help needed turning from your back to your side while in a flat bed without using bedrails?: None Help needed moving from lying on your back to sitting on the side of a flat bed without using bedrails?: None Help needed moving to and from a bed to a chair (including a wheelchair)?: A Little Help needed standing up from a chair using your arms (e.g., wheelchair or bedside chair)?: A Little Help needed to walk in hospital room?: A Lot Help needed climbing 3-5 steps with a railing? : Total 6 Click Score: 17    End of Session Equipment Utilized During Treatment: Gait belt;Oxygen Activity Tolerance: Patient limited by pain;Patient tolerated treatment well Patient left: in bed;with call bell/phone within reach;with bed alarm set Nurse Communication: Mobility status PT Visit Diagnosis: Unsteadiness on feet (R26.81);Muscle weakness (generalized) (M62.81);Difficulty in walking, not elsewhere classified (R26.2);Pain     Time: 1610-9604 PT Time Calculation (min) (ACUTE ONLY): 29 min  Charges:    $Gait Training: 8-22 mins $Therapeutic Activity: 8-22 mins PT General Charges $$ ACUTE PT VISIT: 1 Visit                     Shela Nevin, PT, DPT Acute Rehab Services [phone]    Gladys Damme 05/18/2023, 3:34 PM

## 2023-05-18 NOTE — Progress Notes (Signed)
OT Cancellation Note  Patient Details Name: Kathleen [name] MRN: [id number] DOB: [birth year]   Cancelled Treatment:    Reason Eval/Treat Not Completed: (P) Patient declined, no reason specified, Pt working with nursing to get IV placed and blood transfusion, just finished with PT, tired/painful. Asked to return tomorrow.  Alexis Goodell 05/18/2023, 2:34 PM

## 2023-05-18 NOTE — Progress Notes (Signed)
Mobility Specialist Progress Note;   05/18/23 0925  Mobility  Activity Transferred to/from [facility];Transferred from bed to chair  Level of Assistance Contact guard assist, steadying assist  Assistive Device Other (Comment) (HHA)  Distance Ambulated (ft) 5 ft  Activity Response Tolerated well  Mobility Referral Yes  Mobility visit 1 Mobility  Mobility Specialist Start Time (ACUTE ONLY) [id number]  Mobility Specialist Stop Time (ACUTE ONLY) 0940  Mobility Specialist Time Calculation (min) (ACUTE ONLY) 15 min   Pt agreeable to mobility. Requested assistance to [facility], void successful. Required MinG assistance for transfer from bed to [facility] to chair. Pt did become a bit tearful during session. VSS throughout. Pt left in chair with all needs met.   Caesar Bookman Mobility Specialist Please contact via SecureChat or Rehab Office [phone]

## 2023-05-18 NOTE — Progress Notes (Addendum)
PHARMACY - ANTICOAGULATION CONSULT NOTE  Pharmacy Consult for Heparin gtt Indication: atrial fibrillation  Patient Measurements: Height: 5' (152.4 cm) Weight: 86.9 kg (191 lb 9.3 oz) IBW/kg (Calculated) : 45.5 Heparin Dosing Weight: 64 kg  Vital Signs: Temp: 97.6 F (36.4 C) (12/10 0800) Temp Source: Oral (12/10 0800) BP: 107/42 (12/10 0800) Pulse Rate: 92 (12/10 0800)  Labs: Recent Labs    05/16/23 0227 05/16/23 0630 05/17/23 0306 05/18/23 0419  HGB 7.5*  --  7.2* 7.0*  HCT 24.6*  --  23.8* 23.0*  PLT 217  --  210 215  HEPARINUNFRC 0.47  --  0.48 0.46  CREATININE  --  0.63 0.66  --    Estimated Creatinine Clearance: 58.7 mL/min (by C-G formula based on SCr of 0.66 mg/dL).  Assessment: [name] with history of atrial fibrillation on Xarelto prior to admission. Last dose of Xarelto 04/21/23 at 1900. Admitted for recurrent diverticulitis, perforation, and perirectal abscess. 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 Cleared by surgery to start hep gtt for afib.  Heparin level therapeutic at 0.46 on Heparin 1900 units/hr for h/o Afib. Hgb 7.0, has trending down from mid-high 7s range. Transfusing 1 units PRBCs today.  No bleeding reported.   Goal of Therapy:  Heparin level 0.3-0.7 units/ml Monitor platelets by anticoagulation protocol: Yes   Plan:   Continue heparin at 1900 units/hr Heparin level/CBC daily Monitor for signs/symptoms of bleeding F/u restart rivaroxaban  Noah Delaine, RPh Clinical Pharmacist  Please check AMION for all [facility] Pharmacy phone numbers After 10:00 PM, call Main Pharmacy [phone]

## 2023-05-18 NOTE — TOC Progression Note (Signed)
Transition of Care [facility]) - Progression Note    Patient Details  Name: Kathleen [name] MRN: [id number] Date of Birth: Nov 08, 1945  Transition of Care [facility]) CM/SW Contact  Mearl Latin, LCSW Phone Number: 05/18/2023, 12:11 PM  Clinical Narrative:    CSW spoke with patient regarding overall discharge plan. Patient expressed understanding of PT recommendation and is agreeable to SNF placement at time of discharge. Patient has been to [facility] twice before and had a good experience the first time but not the second. She would like to review all offers. CSW discussed insurance authorization process and will provide Medicare SNF ratings list. CSW will send out referrals for review once patient is off of TPN.   Skilled Nursing Rehab Facilities-   [email or web address]   Ratings out of 5 stars (5 the highest)   Name Address  Phone # Quality Care Staffing Health Inspection Overall  [facility] [address] [phone] 2 2 5 5   [facility] [address] [place] [phone] 4 2 4 4   [facility] [address], Ginette Otto [phone] 2 1 2 1   [facility] [address] [place] [phone] 3 1 4 3   [facility]  [address], [place] [phone] 4 4 5 5   [facility] [address], [facility] [phone] 3 2 2 2   [facility] [address] [place] [phone] 5 1 2 2   [facility] [address], [place] [phone] 1 1 3 1   [address] ([place]) [address] [place] [phone] 2 2 2 2   [facility] [address] [place], [place] [phone] 2 2 1 1   [facility]) [address], [place] [phone] 3 1 1 1   [facility] [address] Liliane Shi [phone] 3 3 4 4   [facility] [address], [place] [phone] 2 2 3 3           [facility] [address], [place] [phone] 4  2 1 1   [facility], [place] [address], [place] [phone] 1 1 2 1   [facility] [address] [place] [phone] 2 1 4 3   [facility] [place] [address] [place]ree Ditto [phone] 2 1 4 3   [facility] [address] [place] [phone] 2 3 3 3           [address] (no [facility]) [address], [place] [phone] 4 5 5 5   [facility] (No [employer]) [address] [place] [phone] 1 2 4 3   [facility] [address] [place] [phone] 2 1 2 1   [facility] (No [employer]) [address], [place] [phone] 4 1 5 4   [facility] [address], [facility] [phone] 3 4 2 2   [facility] [address], [place] [phone] 2 1 1 1   [facility] [address] Liliane Shi [phone] 4 2 5 5   [facility]  [address], [place] [phone] 2 2 3 3   [facility] [address] [place] [phone] 4 1 1 1   [facility] [address] [place], [place] [phone] 2 2 3 3           [facility] [address] [place] [phone] 2 1 1 1   [facility] [address] Evlyn Clines  [phone] 3 3 3 3   [facility] (No [employer]) [address] [place] [phone] 2 2 4 4   [facility]) [address], [place]alita Levan [phone] 2  1 1 1   [facility] [place] [address], [place]alita Levan [phone] 4 3 5 5   [facility] [address] [place], [place] [phone] 1 1 1 1           [facility] [address] [place], [place] [phone] 5 4 5 5   [facility])  [address] [place] [phone] 1 1 2 1   [facility]) [address], [place] [phone]  2 4 4   [facility] [address] [place] [phone] 3 5 5 5   [address] [place], [place] [phone] 4 2 2 2   [facility]) [address] [place] [phone] 2 1 3 2       Expected Discharge Plan:  Skilled Nursing Facility Barriers to Discharge: Continued Medical Work up, SNF Pending bed offer  Expected Discharge Plan and Services In-house Referral: Clinical Social Work   Post Acute Care Choice: Skilled Nursing Facility Living arrangements for the past 2 months: Independent Living Facility ([employer])                                       Social Determinants of Health (SDOH) Interventions SDOH Screenings   Food Insecurity: No Food Insecurity (04/22/2023)  Housing: Low Risk  (04/22/2023)  Transportation Needs: No Transportation Needs (04/22/2023)  Utilities: Not At Risk (04/22/2023)  Alcohol Screen: Low Risk  (05/21/2022)  Depression (PHQ2-9): Low Risk  (05/21/2022)  Financial Resource Strain: Low Risk  (05/21/2022)  Physical Activity: Insufficiently Active (05/21/2022)  Social Connections: Moderately Isolated (05/21/2022)  Stress: No Stress Concern Present (05/21/2022)  Tobacco Use: Medium Risk (04/27/2023)    Readmission Risk Interventions    05/18/2023   12:10 PM 04/26/2023    9:51 AM 01/14/2023   11:16 AM  Readmission Risk Prevention Plan  Transportation Screening Complete Complete Complete  PCP or Specialist Appt within 5-7 Days   Complete  PCP or Specialist Appt within 3-5 Days  Complete   Home Care Screening   Complete  Medication Review (RN CM)   Complete  HRI or Home Care Consult  Complete   Palliative Care Screening  --   Medication Review (RN Care Manager) Complete Complete   PCP or Specialist appointment within 3-5 days of discharge Complete    HRI or Home Care Consult Complete    SW Recovery Care/Counseling Consult Complete    Palliative Care Screening Not Applicable    Skilled Nursing Facility Complete

## 2023-05-19 ENCOUNTER — Other Ambulatory Visit: Payer: Medicare Other | Admitting: Internal Medicine

## 2023-05-19 ENCOUNTER — Inpatient Hospital Stay (HOSPITAL_COMMUNITY): Payer: Medicare Other

## 2023-05-19 DIAGNOSIS — E440 Moderate protein-calorie malnutrition: Secondary | ICD-10-CM | POA: Diagnosis not present

## 2023-05-19 DIAGNOSIS — E039 Hypothyroidism, unspecified: Secondary | ICD-10-CM | POA: Diagnosis not present

## 2023-05-19 DIAGNOSIS — E1140 Type 2 diabetes mellitus with diabetic neuropathy, unspecified: Secondary | ICD-10-CM | POA: Diagnosis not present

## 2023-05-19 DIAGNOSIS — K5720 Diverticulitis of large intestine with perforation and abscess without bleeding: Secondary | ICD-10-CM | POA: Diagnosis not present

## 2023-05-19 LAB — TYPE AND SCREEN
ABO/RH(D): A POS
Antibody Screen: NEGATIVE
Unit division: 0

## 2023-05-19 LAB — CBC
HCT: 26 % — ABNORMAL LOW (ref 36.0–46.0)
Hemoglobin: 8.1 g/dL — ABNORMAL LOW (ref 12.0–15.0)
MCH: 26.5 pg (ref 26.0–34.0)
MCHC: 31.2 g/dL (ref 30.0–36.0)
MCV: 85 fL (ref 80.0–100.0)
Platelets: 198 10*3/uL (ref 150–400)
RBC: 3.06 MIL/uL — ABNORMAL LOW (ref 3.87–5.11)
RDW: 17 % — ABNORMAL HIGH (ref 11.5–15.5)
WBC: 9.1 10*3/uL (ref 4.0–10.5)
nRBC: 0.3 % — ABNORMAL HIGH (ref 0.0–0.2)

## 2023-05-19 LAB — BPAM RBC
Blood Product Expiration Date: 202412312359
ISSUE DATE / TIME: 202412101609
Unit Type and Rh: 6200

## 2023-05-19 LAB — HEPARIN LEVEL (UNFRACTIONATED): Heparin Unfractionated: 0.44 [IU]/mL (ref 0.30–0.70)

## 2023-05-19 MED ORDER — GUAIFENESIN-DM 100-10 MG/5ML PO SYRP
5.0000 mL | ORAL_SOLUTION | ORAL | Status: DC | PRN
  Administered 2023-05-19 – 2023-05-26 (×7): 5 mL via ORAL
  Filled 2023-05-19 (×8): qty 5

## 2023-05-19 MED ORDER — RIVAROXABAN 20 MG PO TABS
20.0000 mg | ORAL_TABLET | Freq: Every day | ORAL | Status: DC
  Administered 2023-05-19 – 2023-05-26 (×8): 20 mg via ORAL
  Filled 2023-05-19 (×8): qty 1

## 2023-05-19 MED ORDER — CLINIMIX E/DEXTROSE (8/10) 8 % IV SOLN
INTRAVENOUS | Status: AC
  Filled 2023-05-19 (×2): qty 1000

## 2023-05-19 NOTE — Progress Notes (Addendum)
PHARMACY - ANTICOAGULATION CONSULT NOTE  Pharmacy Consult for Heparin gtt Indication: atrial fibrillation  Patient Measurements: Height: 5' (152.4 cm) Weight: 93.2 kg (205 lb 7.5 oz) IBW/kg (Calculated) : 45.5 Heparin Dosing Weight: 64 kg  Vital Signs: Temp: 97.6 F (36.4 C) (12/11 0400) Temp Source: Oral (12/11 0400) BP: 118/58 (12/11 0400) Pulse Rate: 70 (12/11 0400)  Labs: Recent Labs    05/17/23 0306 05/18/23 0419 05/19/23 0206  HGB 7.2* 7.0* 8.1*  HCT 23.8* 23.0* 26.0*  PLT 210 215 198  HEPARINUNFRC 0.48 0.46 0.44  CREATININE 0.66  --   --    Estimated Creatinine Clearance: 61 mL/min (by C-G formula based on SCr of 0.66 mg/dL).  Assessment: [name] with history of atrial fibrillation on Xarelto prior to admission. Last dose of Xarelto 04/21/23 at 1900. Admitted for recurrent diverticulitis, perforation, and perirectal abscess. 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 Cleared by surgery to start hep gtt for afib.  Heparin level therapeutic at 0.44 on Heparin 1900 units/hr for h/o Afib. Hgb 8 after 1 unit RBC.   Goal of Therapy:  Heparin level 0.3-0.7 units/ml Monitor platelets by anticoagulation protocol: Yes   Plan:   Continue heparin at 1900 units/hr Heparin level/CBC daily Monitor for signs/symptoms of bleeding F/u restart rivaroxaban  ADDENDUM 10:50 - ok to switch back to rivaroxaban per surgery and TRH. Stop heparin, start rivaroxaban 20mg  daily with meal.  Alphia Moh, PharmD, BCPS, BCCP Clinical Pharmacist  Please check AMION for all [facility] Pharmacy phone numbers After 10:00 PM, call Main Pharmacy [phone]

## 2023-05-19 NOTE — Plan of Care (Signed)
  Problem: Education: Goal: Ability to describe self-care measures that may prevent or decrease complications (Diabetes Survival Skills Education) will improve Outcome: Progressing   Problem: Coping: Goal: Ability to adjust to condition or change in health will improve Outcome: Progressing   Problem: Fluid Volume: Goal: Ability to maintain a balanced intake and output will improve Outcome: Progressing   Problem: Metabolic: Goal: Ability to maintain appropriate glucose levels will improve Outcome: Progressing   Problem: Skin Integrity: Goal: Risk for impaired skin integrity will decrease Outcome: Progressing   Problem: Health Behavior/Discharge Planning: Goal: Ability to manage health-related needs will improve Outcome: Progressing   Problem: Clinical Measurements: Goal: Cardiovascular complication will be avoided Outcome: Progressing

## 2023-05-19 NOTE — Progress Notes (Signed)
Patient ID: Kathleen [name], female   DOB: 12/30/1945, 77 y.o.   MRN: [id number] [facility] Surgery Progress Note  22 Days Post-Op  Subjective: CC-  States that she is feeling tired. Continues to have intermittent upper abdominal pain and back pain. Ongoing nausea, no emesis. No appetite. She did not eat yesterday but did drink Ensure x2 yesterday. Ostomy productive.  Objective: Vital signs in last 24 hours: Temp:  [97.6 F (36.4 C)-98.6 F (37 C)] 97.6 F (36.4 C) (12/11 0400) Pulse Rate:  [69-93] 70 (12/11 0400) Resp:  [16-20] 16 (12/11 0400) BP: (86-125)/(42-89) 118/58 (12/11 0400) SpO2:  [97 %-100 %] 98 % (12/11 0734) Weight:  [93.2 kg] 93.2 kg (12/11 0500) Last BM Date : 05/18/23  Intake/Output from previous day: 12/10 0701 - 12/11 0700 In: 2274.2 [I.V.:1644.2; Blood:630] Out: 5 [Drains:5] Intake/Output this shift: No intake/output data recorded.  PE: Gen:  Alert, NAD Card:  RRR Pulm:  rate and effort normal on supplemental O2 via [place] Abd: soft, obese, mild uper tenderness, open midline stable with few areas of breakdown and separation but fascia appears intact/ otherwise mostly healthy pink granulation tissue, Stoma viable but difficult to visualize through pouch, small amount of thin brown stool in bag. JP#1 and #2 scant serous fluid  Lab Results:  Recent Labs    05/18/23 0419 05/19/23 0206  WBC 9.4 9.1  HGB 7.0* 8.1*  HCT 23.0* 26.0*  PLT 215 198   BMET Recent Labs    05/17/23 0306  NA 134*  K 3.6  CL 101  CO2 24  GLUCOSE 165*  BUN 28*  CREATININE 0.66  CALCIUM 8.7*   PT/INR No results for input(s): "LABPROT", "INR" in the last 72 hours. CMP     Component Value Date/Time   NA 134 (L) 05/17/2023 0306   NA 130 (L) 12/28/2012 0748   K 3.6 05/17/2023 0306   K 4.6 12/28/2012 0748   CL 101 05/17/2023 0306   CL 95 (L) 12/28/2012 0748   CO2 24 05/17/2023 0306   CO2 23 12/28/2012 0748   GLUCOSE 165 (H) 05/17/2023 0306   GLUCOSE 391 (H)  12/28/2012 0748   BUN 28 (H) 05/17/2023 0306   BUN 32 (H) 12/28/2012 0748   CREATININE 0.66 05/17/2023 0306   CREATININE 0.86 01/27/2023 1137   CALCIUM 8.7 (L) 05/17/2023 0306   CALCIUM 14.3 (HH) 06/26/2019 1618   PROT 5.2 (L) 05/17/2023 0306   ALBUMIN 2.2 (L) 05/17/2023 0306   AST 10 (L) 05/17/2023 0306   ALT 10 05/17/2023 0306   ALKPHOS 49 05/17/2023 0306   BILITOT 0.5 05/17/2023 0306   GFRNONAA >60 05/17/2023 0306   GFRNONAA 43 (L) 12/28/2012 0748   GFRAA 58 (L) 06/28/2019 0427   GFRAA 50 (L) 12/28/2012 0748   Lipase     Component Value Date/Time   LIPASE 30 04/22/2023 1840       Studies/Results: No results found.  Anti-infectives: Anti-infectives (From admission, onward)    Start     Dose/Rate Route Frequency Ordered Stop   05/10/23 1000  piperacillin-tazobactam (ZOSYN) IVPB 3.375 g  Status:  Discontinued        3.375 g 12.5 mL/hr over 240 Minutes Intravenous Every 8 hours 05/10/23 0914 05/17/23 1317   05/03/23 1400  piperacillin-tazobactam (ZOSYN) IVPB 3.375 g        3.375 g 12.5 mL/hr over 240 Minutes Intravenous Every 8 hours 05/03/23 0935 05/07/23 0124   04/27/23 1021  sodium chloride 0.9 % with cefoTEtan (  CEFOTAN) ADS Med       Note to Pharmacy: Shanda Bumps M: cabinet override      04/27/23 1021 04/27/23 1253   04/26/23 0800  cefoTEtan (CEFOTAN) 2 g in sodium chloride 0.9 % 100 mL IVPB  Status:  Discontinued        2 g 200 mL/hr over 30 Minutes Intravenous To ShortStay Surgical 04/23/23 2111 04/27/23 0800   04/22/23 2300  piperacillin-tazobactam (ZOSYN) IVPB 3.375 g        3.375 g 12.5 mL/hr over 240 Minutes Intravenous Every 8 hours 04/22/23 2256 05/02/23 2359   04/22/23 1900  piperacillin-tazobactam (ZOSYN) IVPB 3.375 g        3.375 g 100 mL/hr over 30 Minutes Intravenous  Once 04/22/23 1852 04/22/23 1954        Assessment/Plan POD#22 S/p Exploratory laparotomy, sigmoid colectomy, colostomy 11/19/24Dwain Sarna - CT 12/8 fairly unchanged with  mild ileus and small 3.8x2.2cm fluid collection near colonic stump.  JP drain x2 previously purulent, they are serous today and minimal output. Continue drains. - WBC WNL, afebrile. Off antibiotics - Having ostomy output and no emesis, but she has no appetite. She drank Ensure x2 yesterday. Will wean TPN to 1/2 rate today and continue to encourage PO intake with soft diet and protein shakes - WOC following for new ostomy. Stoma with some mucocutaneous separation, monitor. Will need ostomy clinic referral at discharge  - vac unable to hold seal and was removed, continue BID wet to dry dressing changes - oob, pulm toilet   ID zosyn 11/14>>11/29, 12/2>>12/9 VTE - heparin heparin gtt for afib  FEN - 1/2 TPN, soft, Ensure    LOS: 27 days    Franne Forts, [facility] Surgery 05/19/2023, 8:21 AM Please see Amion for pager number during day hours 7:00am-4:30pm

## 2023-05-19 NOTE — Progress Notes (Signed)
Occupational Therapy Treatment Patient Details Name: Kathleen [name] MRN: [id number] DOB: 08-09-1945 Today's Date: 05/19/2023   History of present illness 77 y.o. 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 S/p exploratory laparotomy with sigmoid colectomy and end left colostomy by Dr. Dwain Sarna on 04/27/2023.  PMHx: colovaginal fistula associated with diverticulitis, obesity, OSA , BOOP, tracheobronchomalacia, chronic respiratory failure on 4 to 5 L oxygen at baseline, persistent A-fib S/P pacemaker implant 2017, chronic systolic heart failure, recent admission from 04/09/2023 to 04/13/2023.   OT comments  Pt making steady progress towards OT goals this session. Pt continues to present with decreased activity tolerance and generalized deconditioning . Session focus on BADL reeducation, functional mobility and increasing overall activity tolerance for higher level ADLS. Pt currently requires CGA for ADL transfers, MIN A for UB ADLS and CGA for 3/3 toileting tasks. See vitals below as pt does report dizziness with mobility, pt on 4L [place] during session. Pt would continue to benefit from skilled occupational therapy while admitted and after d/c to address the below listed limitations in order to improve overall functional mobility and facilitate independence with BADL participation. DC plan remains appropriate, will follow acutely per POC.         If plan is discharge home, recommend the following:  A little help with walking and/or transfers;A lot of help with bathing/dressing/bathroom;Assistance with cooking/housework;Assist for transportation;Help with stairs or ramp for entrance   Equipment Recommendations  None recommended by OT    Recommendations for Other Services      Precautions / Restrictions Precautions Precautions: Fall Precaution Comments: multiple drains, colostomy Restrictions Weight Bearing Restrictions: No       Mobility Bed  Mobility Overal bed mobility: Needs Assistance Bed Mobility: Supine to Sit, Sit to Supine     Supine to sit: Contact guard, HOB elevated, Used rails Sit to supine: Contact guard assist, HOB elevated, Used rails   General bed mobility comments: assist needed mostly for line mgmt, CGA for safety, increased time needed    Transfers Overall transfer level: Needs assistance Equipment used: Rollator (4 wheels), None Transfers: Sit to/from Stand Sit to Stand: Contact guard assist Stand pivot transfers: Contact guard assist (no AD for stand pivot)         General transfer comment: pt completed stand pivot transfer from EOB>BSC with no AD and CGA, CGA needed for safety and line mgmt. pt additionally completed functional ambulation in hallway ~ 20 ft with rollator and CGA, MIN A provided as pt fatiued. MIN verbal cues needed for rollator mgmt. pt needed seated rest break after ~ 10 ft d/t fatigue reports of dizziness     Balance Overall balance assessment: Needs assistance Sitting-balance support: No upper extremity supported, Feet supported Sitting balance-Leahy Scale: Good     Standing balance support: No upper extremity supported, During functional activity Standing balance-Leahy Scale: Fair Standing balance comment: able to complete pericare in standing with no UE support and CGA, no LOB                           ADL either performed or assessed with clinical judgement   ADL Overall ADL's : Needs assistance/impaired     Grooming: Wash/dry hands;Sitting;Set up Grooming Details (indicate cue type and reason): wash cloth set- up after toileting     Lower Body Bathing: Contact guard assist;Sit to/from stand Lower Body Bathing Details (indicate cue type and reason): simulated via posterior pericare after  toileting, CGA for balance support Upper Body Dressing : Minimal assistance;Sitting Upper Body Dressing Details (indicate cue type and reason): to don new gown from  EOB     Toilet Transfer: Contact guard assist;Stand-pivot;BSC/3in1 Toilet Transfer Details (indicate cue type and reason): no AD needed, CGA for balance and increased safety with line mgmt Toileting- Clothing Manipulation and Hygiene: Contact guard assist;Sit to/from stand Toileting - Clothing Manipulation Details (indicate cue type and reason): posterior pericare in standing     Functional mobility during ADLs: Contact guard assist;Rollator (4 wheels);Minimal assistance (increased physical assist needed as pt fatigues) General ADL Comments: ADL participation impacted by decreased activity tolerance    Extremity/Trunk Assessment Upper Extremity Assessment Upper Extremity Assessment: Overall WFL for tasks assessed;Generalized weakness   Lower Extremity Assessment Lower Extremity Assessment: Defer to PT evaluation        Vision Baseline Vision/History: 1 Wears glasses Ability to See in Adequate Light: 0 Adequate Patient Visual Report: No change from baseline     Perception Perception Perception: Within Functional Limits   Praxis Praxis Praxis: WFL    Cognition Arousal: Alert Behavior During Therapy: WFL for tasks assessed/performed Overall Cognitive Status: Within Functional Limits for tasks assessed                                          Exercises      Shoulder Instructions       General Comments bp from supine prior to session- 105/59( 73). BP from supine after session- 91/52( 58) pt on 4L [place] during session, inaccurate readings noted during session however no overt SOB noted, pt did endorse dizziness with mobility needing seated rest break. pt agreeable to SNF placement stating " I need time to figure all of this out." continent urine void during session    Pertinent Vitals/ Pain       Pain Assessment Pain Assessment: No/denies pain  Home Living                                          Prior Functioning/Environment               Frequency  Min 1X/week        Progress Toward Goals  OT Goals(current goals can now be found in the care plan section)  Progress towards OT goals: Progressing toward goals  Acute Rehab OT Goals Patient Stated Goal: to get more rehab OT Goal Formulation: With patient Time For Goal Achievement: 05/12/23 (seen by OTR 12/6) Potential to Achieve Goals: Good  Plan      Co-evaluation                 AM-PAC OT "6 Clicks" Daily Activity     Outcome Measure   Help from another person eating meals?: None Help from another person taking care of personal grooming?: A Little Help from another person toileting, which includes using toliet, bedpan, or urinal?: A Little Help from another person bathing (including washing, rinsing, drying)?: A Lot Help from another person to put on and taking off regular upper body clothing?: A Little Help from another person to put on and taking off regular lower body clothing?: A Lot 6 Click Score: 17    End of Session Equipment Utilized During Treatment: Gait belt;Rollator (4 wheels);Oxygen;Other (  comment) (4L [place])  OT Visit Diagnosis: Unsteadiness on feet (R26.81);Other abnormalities of gait and mobility (R26.89);Muscle weakness (generalized) (M62.81);Pain   Activity Tolerance Patient tolerated treatment well   Patient Left in bed;with call bell/phone within reach;with bed alarm set   Nurse Communication Mobility status;Other (comment) (via secure chat)        Time: 9629-5284 OT Time Calculation (min): 38 min  Charges: OT General Charges $OT Visit: 1 Visit OT Treatments $Self Care/Home Management : 38-52 mins  Lenor Derrick., COTA/L Acute Rehabilitation Services [phone]   Barron Schmid 05/19/2023, 9:27 AM

## 2023-05-19 NOTE — Progress Notes (Signed)
PHARMACY - TOTAL PARENTERAL NUTRITION CONSULT NOTE  Indication: Prolonged ileus  Patient Measurements: Height: 5' (152.4 cm) Weight: 93.2 kg (205 lb 7.5 oz) IBW/kg (Calculated) : 45.5 TPN AdjBW (KG): 54 Body mass index is 40.13 kg/m. Weight trends: 79.3kg (11/21) >>93.2 kg 12/11  Assessment:  47 YOF with recurrent sigmoid diverticulitis and associated colovaginal fistula, HFrEF, COPD, Afib with PPM, and gout who was admitted with persistent symptoms with multiple abscesses s/p ex lap with sigmoid colectomy and end left colostomy on 11/19. Patient has been NPO since surgery. Pharmacy consulted to manage TPN.  Weights have been stable in the last year ~182-194 lbs. Patient doesn't eat much at home, only 2 meals a day. For breakfast she might eat grits, an egg and coffee. For dinner she cooks her own food (doesn't like the food at facility) and might include chicken, green beans, carrots and rice. She does drink chocolate Boost regularly and thinks Boost tastes better than Ensure. She does not like vanilla or berry flavored supplements. Unfortunately, we only have Boost in wild [place].  Glucose / Insulin:  BG < 180, off SSI -hx T2DM, A1c 6.2%; no meds PTA   Electrolytes: Na 134, K 3.6, Mg 1.6 (labs Mon, [place])  Renal: SCr 0.66, BUN 28 Hepatic: AST 10 low, ALT/tbili WNL, TG 135, albumin 2.2 Intake / Output; MIVF: metoclopramide 12/2>> 12/8; UOP 600 mL (0.27 mL/kg/hr), on torsemide 40mg  po and spironolactone 25mg . JP drain 10mL, colostomy 0 mL (incomplete charting)  GI Imaging: 11/21 KUB moderately dilated loops of small bowel, c/w post-operative ileus 11/23 KUB: persistent SBO or ileus, no significant change from prior 11/25 KUB: bowel obstruction or ileus, pleural effusion vs atelectasis 11/25 CTAP: post-op ileus or distal SBO, trace L pleural effusion 11/26 CXR: L > R lower lobe atelectasis 11/29 Abd XR: distention of SB concerning for obstruction or worsening postop ileus 12/1 CT: mild  ileus 12/6 KUB: improved but not normalized ileus-like bowel gas pattern 12/8 CT:  Small-bowel dilation with air-fluid levels, consistent with ileus, mildly improved from the prior  GI Surgeries / Procedures:  11/19 ex lap with sigmoid colectomy and end left colostomy  Central access: PICC placed 04/30/23 TPN start date: 04/30/23  Nutritional Goals: Clinimix E 8/10 1L at 42 ml/hr with SMOF lipids on MTWThFSa Clinimix E 8/10 2L at 82 ml/hr on Sun with SMOFlipids - Average of 1258 kCals (~80% of goal) and 91g AA per day (100% goal) Previously on D10 fluids for add'l kCal >> stopped on 11/26 given concerns for pleural effusions/atelectasis noted on imaging, weights increased requiring Lasix IV - this has resolved so restarted D10 at 11/29, then changed to sodium bicarb D5 gtt   RD Estimated Needs Total Energy Estimated Needs: 1600-1800 kcal Total Protein Estimated Needs: 75-95 gm Total Fluid Estimated Needs: >1.6L  Current Nutrition:  TPN 12/5 CLD with worsening abd pain and N/V 12/6 NPO: bloated, + flatus; restart metoclopramide  12/7 FLD: ate 1-2 bites grits (too watery for her), sorbet, coffee 12/8 FLD: ate half bowl of grits, whole tomato soup, 1 jello, lemonade. Patient doesn't like vanilla flavored things, does drink Boost at home. Patient has had constant RUQ abdominal pain for the past few days that suddenly worsens at times. She's not sure what makes it worse, maybe movement; it is not correlated with eating. Patient would like to have high protein Chocolate Boost  12/9 FLD: no appetite but abd pain is better. States she's not a breakfast person. Will try to drink  protein supplement though she's doesn't like the flavor and Max protein Ensure.  12/10 FLD > Soft. Nausea overnight, 1 dose of Zofran without much relief per patient. Spoke with provider and patient - working to advance diet. Drinking most of nutritional supplements. Not ready to half TPN yet.   12/11 Soft diet. Not  much of an appetite. Refused 1x protein supplement yesterday. Consult modified to half TPN to stimulate appetite.   Plan:  Clinimix 8/10E with lytes 1L at 42 mL/hr with no SMOF lipids to provide 80 g AA, 100 g dextrose for a total of 660 kCal per day from TPN.  Patient will receive fats from PO diet and can give parenteral lipids intermittently to avoid EFAD, if needed. Patient has received 250 mL SMOF every day from 11/22-12/11.  Electrolytes in Clinimix E 8/10 1L bag: Na 35 mEq, K 30 mEq, Mag 5 mEq, Ca 4.5 mEq, Phos , Acetate 83, Cl 76 mEq  No MVI or trace elements in Clinimix, give MVI by mouth  Monitor TPN labs Mon/Thur and PRN  Continue Chocolate Ensure max and oral protein supplement  F/U oral intake, wean off TPN as able  Thank you for involving pharmacy in this patient's care.  Cedric Fishman, PharmD, BCPS, BCCCP Clinical Pharmacist

## 2023-05-19 NOTE — Progress Notes (Signed)
PROGRESS NOTE        PATIENT DETAILS Name: Kathleen [name] Age: 77 y.o. Sex: female Date of Birth: [birth year] Admit Date: 04/22/2023 Admitting Physician Gery Pray, MD NGE:[id number], Bernadene Bell, MD  Brief Summary:  Patient is a 77 y.o.  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  Admitted by [facility]-started on empiric IV antibiotics-underwent preop assessment by cardiology-and then subsequently underwent  exploratory laparotomy with sigmoid colectomy and end ostomy on 11/19.  Postoperative course complicated by ileus-requiring NG tube decompression-and TNA for nutrition.  See below for further details.  Significant events: 11/14>> admit to [facility] 11/19>>exploratory laparotomy with sigmoid colectomy and end ostomy  12/04>> NG removed-clear liquid started  Significant studies: 11/14>> CT abdomen/pelvis: Worsening signal right diverticulitis-localized perforation-5.6 cm perirectal abscess, 6.0 cm left lower quadrant abscess. 11/22>> x-ray abdomen: Consistent with postoperative ileus. 11/25>> CT abdomen/pelvis: No abscess 12/01>> CT abdomen/pelvis: Mild small bowel ileus-no obvious abscess-small fluid collection in the colonic stump  Significant microbiology data: 11/14>> blood culture: No growth 11/14>> COVID/influenza/RSV PCR: Negative  Procedures: 11/19>>exploratory laparotomy with sigmoid colectomy  end ostomy on 11/19  Consults: Cardiology GI General surgery  Subjective: Abdominal pain essentially unchanged-tolerating diet-dark brown stools in ostomy.  Objective: Vitals: Blood pressure (!) 91/52, pulse 71, temperature 97.6 F (36.4 C), temperature source Oral, resp. rate 12, height 5' (1.524 m), weight 93.2 kg, SpO2 100%.   Exam: Gen Exam:Alert  awake-not in any distress HEENT:atraumatic, normocephalic Chest: B/L clear to auscultation anteriorly CVS:S1S2 regular Abdomen: Soft-dark brown stools in ostomy. Extremities: Trace edema Neurology: Non focal Skin: no rash   Pertinent Labs/Radiology:    Latest Ref Rng & Units 05/19/2023    2:06 AM 05/18/2023    4:19 AM 05/17/2023    3:06 AM  CBC  WBC 4.0 - 10.5 K/uL 9.1  9.4  8.1   Hemoglobin 12.0 - 15.0 g/dL 8.1  7.0  7.2   Hematocrit 36.0 - 46.0 % 26.0  23.0  23.8   Platelets 150 - 400 K/uL 198  215  210     Lab Results  Component Value Date   NA 134 (L) 05/17/2023   K 3.6 05/17/2023   CL 101 05/17/2023   CO2 24 05/17/2023     Assessment/Plan: 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 with localized perforation and pelvic abscess-s/p ex lap with colectomy/ostomy on 11/19-complicated by postoperative ileus requiring NG tube decompression Overall improved-NG tube discontinued 12/4 Tolerating soft diet TNA being weaned off Zosyn discontinued 12/10 Postop care being managed by general surgery.  Hypokalemia/hypophosphatemia/hypomagnesemia Replete per pharmacy TNA protocol.  Normocytic anemia Secondary to critical illness No evidence of blood loss Transfused 1 additional unit of PRBC on 12/10 Continue to follow CBC periodically.  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  GERD PPI  DM-2 (A1c 6.2 on  11/15) CBGs  stable on SSI  No results for input(s): "GLUCAP" in the last 72 hours.  Nutrition Status: Nutrition Problem: Moderate Malnutrition Etiology: chronic illness Signs/Symptoms: mild fat depletion, mild muscle depletion, energy intake < 75% for > 7 days Interventions: Ensure Enlive (each supplement provides 350kcal and 20 grams of protein), Magic cup, MVI  Obesity: Estimated body mass index is 40.13 kg/m as calculated from the following:   Height as of this encounter: 5' (1.524 m).   Weight as of this encounter: 93.2 kg.   Code status:   Code Status: Limited: Do not attempt resuscitation (DNR) -DNR-LIMITED -Do Not Intubate/DNI    DVT Prophylaxis: IV heparinrivaroxaban (XARELTO) tablet 20 mg    Family Communication: None at bedside.   Disposition Plan: Status is: Inpatient Remains inpatient appropriate because: Severity of illness   Planned Discharge Destination:Skilled nursing facility   Diet: Diet Order             DIET SOFT Fluid consistency: Thin  Diet effective now                     Antimicrobial agents: Anti-infectives (From admission, onward)    Start     Dose/Rate Route Frequency Ordered Stop   05/10/23 1000  piperacillin-tazobactam (ZOSYN) IVPB 3.375 g  Status:  Discontinued        3.375 g 12.5 mL/hr over 240 Minutes Intravenous Every 8 hours 05/10/23 0914 05/17/23 1317   05/03/23 1400  piperacillin-tazobactam (ZOSYN) IVPB 3.375 g        3.375 g 12.5 mL/hr over 240 Minutes Intravenous Every 8 hours 05/03/23 0935 05/07/23 0124   04/27/23 1021  sodium chloride 0.9 % with cefoTEtan (CEFOTAN) ADS Med       Note to Pharmacy: Shanda Bumps M: cabinet override      04/27/23 1021 04/27/23 1253   04/26/23 0800  cefoTEtan (CEFOTAN) 2 g in sodium chloride 0.9 % 100 mL IVPB  Status:  Discontinued        2 g 200 mL/hr over 30 Minutes Intravenous To [facility] 04/23/23 2111 04/27/23 0800   04/22/23 2300  piperacillin-tazobactam (ZOSYN) IVPB 3.375 g         3.375 g 12.5 mL/hr over 240 Minutes Intravenous Every 8 hours 04/22/23 2256 05/02/23 2359   04/22/23 1900  piperacillin-tazobactam (ZOSYN) IVPB 3.375 g        3.375 g 100 mL/hr over 30 Minutes Intravenous  Once 04/22/23 1852 04/22/23 1954        MEDICATIONS: Scheduled Meds:  (feeding supplement) PROSource Plus  30 mL Oral BID BM   sodium chloride   Intravenous Once   acetaminophen  650 mg Oral Q6H   Chlorhexidine Gluconate Cloth  6 each Topical Daily   docusate sodium  100 mg Oral BID   dofetilide  250 mcg Oral Q12H   feeding supplement  237 mL Oral TID BM   fluticasone furoate-vilanterol  1 puff Inhalation Daily   gabapentin  200 mg Oral QHS   lidocaine  1 patch Transdermal Q24H   magic mouthwash  5 mL Oral QID   magnesium oxide  400 mg Oral BID   methocarbamol  500 mg Oral Q6H   multivitamin with minerals  1 tablet Oral Daily   nystatin   Topical BID   mouth rinse  15 mL Mouth Rinse 4 times per day   pantoprazole  40 mg Oral BID   revefenacin  175 mcg Nebulization Daily   rivaroxaban  20 mg Oral Q supper   spironolactone  25 mg Oral Daily   torsemide  40 mg Oral Daily   Continuous Infusions:  promethazine (PHENERGAN) injection (IM or IVPB) Stopped (05/11/23 1422)   TPN (CLINIMIX-E) Adult 42 mL/hr at 05/19/23 0800   TPN (CLINIMIX-E) Adult     PRN Meds:.albuterol, HYDROmorphone (DILAUDID) injection, ondansetron (ZOFRAN) IV, oxyCODONE, promethazine (PHENERGAN) injection (IM or IVPB), sodium chloride flush   I have personally reviewed following labs and imaging studies  LABORATORY DATA: CBC: Recent Labs  Lab 05/15/23 0409 05/16/23 0227 05/17/23 0306 05/18/23 0419 05/19/23 0206  WBC 7.9 8.2 8.1 9.4 9.1  HGB 7.5* 7.5* 7.2* 7.0* 8.1*  HCT 24.7* 24.6* 23.8* 23.0* 26.0*  MCV 85.5 86.0 86.2 84.9 85.0  PLT 216 217 210 215 198    Basic Metabolic Panel: Recent Labs  Lab 05/13/23 0407 05/14/23 0847 05/16/23 0630 05/17/23 0306  NA 131* 133* 131* 134*  K 3.6  4.3 3.6 3.6  CL 102 104 97* 101  CO2 21* 21* 23 24  GLUCOSE 150* 158* 166* 165*  BUN 13 13 21  28*  CREATININE 0.46 0.47 0.63 0.66  CALCIUM 8.1* 8.7* 8.8* 8.7*  MG 1.9 1.7  --  1.6*  PHOS 2.6  --   --  3.9    GFR: Estimated Creatinine Clearance: 61 mL/min (by C-G formula based on SCr of 0.66 mg/dL).  Liver Function Tests: Recent Labs  Lab 05/13/23 0407 05/16/23 0630 05/17/23 0306  AST 12* 12* 10*  ALT 13 11 10   ALKPHOS 66 58 49  BILITOT 0.5 0.6 0.5  PROT 5.0* 5.5* 5.2*  ALBUMIN 2.2* 2.4* 2.2*   No results for input(s): "LIPASE", "AMYLASE" in the last 168 hours. No results for input(s): "AMMONIA" in the last 168 hours.  Coagulation Profile: No results for input(s): "INR", "PROTIME" in the last 168 hours.  Cardiac Enzymes: No results for input(s): "CKTOTAL", "CKMB", "CKMBINDEX", "TROPONINI" in the last 168 hours.  BNP (last 3 results) No results for input(s): "PROBNP" in the last 8760 hours.  Lipid Profile: Recent Labs    05/17/23 0306  TRIG 135      Thyroid Function Tests: No results for input(s): "TSH", "T4TOTAL", "FREET4", "T3FREE", "THYROIDAB" in the last 72 hours.  Anemia Panel: No results for input(s): "VITAMINB12", "FOLATE", "FERRITIN", "TIBC", "IRON", "RETICCTPCT" in the last 72 hours.  Urine analysis:    Component Value Date/Time   COLORURINE AMBER (A) 04/23/2023 0623   APPEARANCEUR CLOUDY (A) 04/23/2023 0623   APPEARANCEUR Hazy 12/25/2012 1653   LABSPEC 1.024 04/23/2023 0623   LABSPEC 1.021 12/25/2012 1653   PHURINE 5.0 04/23/2023 0623   GLUCOSEU NEGATIVE 04/23/2023 0623   GLUCOSEU NEGATIVE 03/17/2023 1003   HGBUR MODERATE (A) 04/23/2023 0623   BILIRUBINUR NEGATIVE 04/23/2023 0623   BILIRUBINUR Negative 12/25/2012 1653   KETONESUR NEGATIVE 04/23/2023 0623   PROTEINUR 30 (A) 04/23/2023 0623   UROBILINOGEN 0.2 03/17/2023 1003   NITRITE NEGATIVE 04/23/2023 0623   LEUKOCYTESUR LARGE (A) 04/23/2023 0623   LEUKOCYTESUR Negative 12/25/2012 1653     Sepsis Labs: Lactic Acid, Venous    Component Value Date/Time   LATICACIDVEN 0.8 04/23/2023 0114    MICROBIOLOGY: No results found for this or any previous visit (from the past 240 hour(s)).   RADIOLOGY STUDIES/RESULTS: No results found.   LOS: 27 days   Jeoffrey Massed, MD  [facility]    To contact the attending provider between 7A-7P or the covering provider during after hours 7P-7A, please log into  the web site [email or web address] and access using universal [place] password for that web site. If you do not have the password, please call the hospital operator.  05/19/2023, 11:45 AM

## 2023-05-19 NOTE — Progress Notes (Signed)
Nutrition Follow-up  DOCUMENTATION CODES:   Non-severe (moderate) malnutrition in context of chronic illness  INTERVENTION:  Advance diet as medically applicable Pharmacy to adjust TPN as diet advances Ensure Plus High Protein po BID, each supplement provides 350 kcal and 20 grams of protein.  Magic cup TID with meals, each supplement provides 290 kcal and 9 grams of protein Discontinue ensure max    NUTRITION DIAGNOSIS:   Moderate Malnutrition related to chronic illness as evidenced by mild fat depletion, mild muscle depletion, energy intake < 75% for > 7 days.  On going being addressed through interventions  GOAL:   Patient will meet greater than or equal to 90% of their needs    MONITOR:   PO intake, Supplement acceptance, Diet advancement, Labs, Weight trends  REASON FOR ASSESSMENT:   Consult New TPN/TNA  ASSESSMENT:   77 y.o. F, admitted with PMH of hypothyroidism, COPD,OSA (on BiPAP), BOOP (noncandidate for stenting), diverticulitis, CHF, obesity, HTN, gout, and colovaginal fistula. Presented 11/14 for worsening diverticulitis with new bowel perforation.  Pt setting up in bed stated that  she is not very impressed with soft diet. Is going to try some scrambled eggs this morning and probably grits which she has been eating most mornings. Sh said she has not had much of an appetite for more solid foods. Requested Magic cup to be chocolate. She did not eat yesterday but drank EE x 2, Understands that she will start being weaned off TPN.  Pt has orders for both Ensure max and Ensure Plus. Due to trying to get patient to take in more food, pt would benefit from having Ensure max discontinued, and continue with ensure plus. This may help to increase appetite.   Pharmacy reports; Current Nutrition:  TPN- started 04/30/23 12/5 CLD with worsening abd pain and N/V 12/6 NPO: bloated, + flatus; restart metoclopramide  12/7 FLD: ate 1-2 bites grits (too watery for her),  sorbet, coffee 12/8 FLD: ate half bowl of grits, whole tomato soup, 1 jello, lemonade. Patient doesn't like vanilla flavored things, does drink Boost at home. Patient has had constant RUQ abdominal pain for the past few days that suddenly worsens at times. She's not sure what makes it worse, maybe movement; it is not correlated with eating. Patient would like to have high protein Chocolate Boost  12/9 FLD: no appetite but abd pain is better. States she's not a breakfast person. Will try to drink protein supplement though she's doesn't like the flavor and Max protein Ensure.  12/10 FLD > Soft. Nausea overnight, 1 dose of Zofran without much relief per patient. Spoke with provider and patient - working to advance diet. Drinking most of nutritional supplements. Not ready to half TPN yet.   12/11 Soft diet. Not much of an appetite. Refused 1x protein supplement yesterday. Consult modified to half TPN to stimulate appetite.  Plan:  Clinimix 8/10E with lytes 1L at 42 mL/hr with no SMOF lipids to provide 80 g AA, 100 g dextrose for a total of 660 kCal per day from TPN.  Patient will receive fats from PO diet and can give parenteral lipids intermittently to avoid EFAD, if needed. Patient has received 250 mL SMOF every day from 11/22-12/11.   Hospital weight history: 05/19/23 0500 93.2 kg 205.47 lbs  05/18/23 0500 86.9 kg 191.58 lbs  05/17/23 0600 87 kg 191.8 lbs  05/16/23 0600 86.5 kg 190.7 lbs  05/15/23 0645 86.5 kg 190.7 lbs  05/14/23 0456 88.8 kg 195.77 lbs  05/11/23  0500 83.3 kg 183.64 lbs  05/10/23 0432 82.6 kg 182.1 lbs  05/09/23 0500 82.2 kg 181.22 lbs  05/08/23 0416 82.9 kg 182.76 lbs  05/06/23 0500 85.3 kg 188.05 lbs  05/05/23 0500 87 kg 191.8 lbs  05/04/23 0500 90.7 kg 199.96 lbs  05/02/23 0500 87.2 kg 192.24 lbs  04/30/23 0500 79.3 kg 174.83 lbs  04/29/23 0500 79.3 kg 174.83 lbs  04/27/23 1028 79.4 kg 175 lbs  04/26/23 0500 84 kg 185.19 lbs  04/25/23 0423 83.5 kg 184.08 lbs  04/24/23  0429 82.7 kg 182.32 lbs  04/22/23 1829 79.4 kg 175 lbs    Average Meal Intake: No current documentation  Nutritionally Relevant Medications: Scheduled Meds:  (feeding supplement) PROSource Plus  30 mL Oral BID BM   sodium chloride   Intravenous Once   docusate sodium  100 mg Oral BID   dofetilide  250 mcg Oral Q12H   feeding supplement  237 mL Oral TID BM   magnesium oxide  400 mg Oral BID   multivitamin with minerals  1 tablet Oral Daily   Ensure Max Protein  11 oz Oral BID BM   spironolactone  25 mg Oral Daily   torsemide  40 mg Oral Daily    Continuous Infusions:  heparin 1,900 Units/hr (05/19/23 0600)   promethazine (PHENERGAN) injection (IM or IVPB) Stopped (05/11/23 1422)   TPN (CLINIMIX-E) Adult 42 mL/hr at 05/19/23 0600   TPN (CLINIMIX-E) Adult     PRN Meds:.albuterol, HYDROmorphone (DILAUDID) injection, ondansetron (ZOFRAN) IV, oxyCODONE, promethazine (PHENERGAN) injection (IM or IVPB), sodium chloride flush  Labs Reviewed  NUTRITION - FOCUSED PHYSICAL EXAM:  Flowsheet Row Most Recent Value  Orbital Region Mild depletion  Upper Arm Region Mild depletion  Thoracic and Lumbar Region Unable to assess  [Fluid]  Buccal Region No depletion  Temple Region Moderate depletion  Clavicle Bone Region Unable to assess  [Fluid]  Clavicle and Acromion Bone Region Mild depletion  Scapular Bone Region Mild depletion  Dorsal Hand Mild depletion  Patellar Region Unable to assess  [Fluid]  Anterior Thigh Region Unable to assess  [Fluid]  Posterior Calf Region Unable to assess  [Fluid]  Edema (RD Assessment) Severe  [Abdomen and leg edema]  Hair Reviewed  Eyes Reviewed  Mouth Reviewed  Skin Reviewed  Nails Reviewed     Diet Order:   Diet Order             DIET SOFT Fluid consistency: Thin  Diet effective now                   EDUCATION NEEDS:   Education needs have been addressed  Skin:  Skin Assessment: Reviewed RN Assessment Skin Integrity Issues:: Wound  VAC Wound Vac: abdomen Other: 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  Estimated Nutritional Needs:   Kcal:  1600-1800 kcal  Protein:  75-95 gm  Fluid:  >1.6L    Jamelle Haring RDN, LDN Clinical Dietitian  Pleas see Amion for contact information

## 2023-05-19 NOTE — Plan of Care (Signed)
  Problem: Education: Goal: Ability to describe self-care measures that may prevent or decrease complications (Diabetes Survival Skills Education) will improve Outcome: Progressing   Problem: Coping: Goal: Ability to adjust to condition or change in health will improve Outcome: Progressing   Problem: Fluid Volume: Goal: Ability to maintain a balanced intake and output will improve Outcome: Progressing   Problem: Health Behavior/Discharge Planning: Goal: Ability to identify and utilize available resources and services will improve Outcome: Progressing   Problem: Tissue Perfusion: Goal: Adequacy of tissue perfusion will improve Outcome: Progressing   Problem: Skin Integrity: Goal: Risk for impaired skin integrity will decrease Outcome: Progressing   Problem: Elimination: Goal: Will not experience complications related to bowel motility Outcome: Progressing   Problem: Pain Management: Goal: General experience of comfort will improve Outcome: Progressing   Problem: Skin Integrity: Goal: Will show signs of wound healing Outcome: Progressing

## 2023-05-20 ENCOUNTER — Ambulatory Visit (HOSPITAL_COMMUNITY): Payer: Medicare Other | Admitting: Physician Assistant

## 2023-05-20 DIAGNOSIS — E1140 Type 2 diabetes mellitus with diabetic neuropathy, unspecified: Secondary | ICD-10-CM | POA: Diagnosis not present

## 2023-05-20 DIAGNOSIS — E039 Hypothyroidism, unspecified: Secondary | ICD-10-CM | POA: Diagnosis not present

## 2023-05-20 DIAGNOSIS — E440 Moderate protein-calorie malnutrition: Secondary | ICD-10-CM | POA: Diagnosis not present

## 2023-05-20 DIAGNOSIS — K5720 Diverticulitis of large intestine with perforation and abscess without bleeding: Secondary | ICD-10-CM | POA: Diagnosis not present

## 2023-05-20 LAB — COMPREHENSIVE METABOLIC PANEL
ALT: 8 U/L (ref 0–44)
AST: 11 U/L — ABNORMAL LOW (ref 15–41)
Albumin: 2.4 g/dL — ABNORMAL LOW (ref 3.5–5.0)
Alkaline Phosphatase: 51 U/L (ref 38–126)
Anion gap: 8 (ref 5–15)
BUN: 25 mg/dL — ABNORMAL HIGH (ref 8–23)
CO2: 29 mmol/L (ref 22–32)
Calcium: 10.3 mg/dL (ref 8.9–10.3)
Chloride: 97 mmol/L — ABNORMAL LOW (ref 98–111)
Creatinine, Ser: 0.54 mg/dL (ref 0.44–1.00)
GFR, Estimated: >60 mL/min (ref >60)
Glucose, Bld: 179 mg/dL — ABNORMAL HIGH (ref 70–99)
Potassium: 3.4 mmol/L — ABNORMAL LOW (ref 3.5–5.1)
Sodium: 134 mmol/L — ABNORMAL LOW (ref 135–145)
Total Bilirubin: 0.7 mg/dL (ref <1.2)
Total Protein: 5.4 g/dL — ABNORMAL LOW (ref 6.5–8.1)

## 2023-05-20 LAB — PHOSPHORUS: Phosphorus: 4.5 mg/dL (ref 2.5–4.6)

## 2023-05-20 LAB — CBC
HCT: 26.6 % — ABNORMAL LOW (ref 36.0–46.0)
Hemoglobin: 8.3 g/dL — ABNORMAL LOW (ref 12.0–15.0)
MCH: 26.1 pg (ref 26.0–34.0)
MCHC: 31.2 g/dL (ref 30.0–36.0)
MCV: 83.6 fL (ref 80.0–100.0)
Platelets: 217 10*3/uL (ref 150–400)
RBC: 3.18 MIL/uL — ABNORMAL LOW (ref 3.87–5.11)
RDW: 17 % — ABNORMAL HIGH (ref 11.5–15.5)
WBC: 8.1 10*3/uL (ref 4.0–10.5)
nRBC: 0.5 % — ABNORMAL HIGH (ref 0.0–0.2)

## 2023-05-20 LAB — MAGNESIUM: Magnesium: 1.7 mg/dL (ref 1.7–2.4)

## 2023-05-20 MED ORDER — HYDROMORPHONE HCL 1 MG/ML IJ SOLN
0.5000 mg | INTRAMUSCULAR | Status: DC | PRN
  Administered 2023-05-20 – 2023-05-22 (×8): 1 mg via INTRAVENOUS
  Administered 2023-05-23 (×2): 0.5 mg via INTRAVENOUS
  Administered 2023-05-23: 1 mg via INTRAVENOUS
  Administered 2023-05-23: 0.5 mg via INTRAVENOUS
  Administered 2023-05-24 (×2): 1 mg via INTRAVENOUS
  Administered 2023-05-24 (×2): 0.5 mg via INTRAVENOUS
  Administered 2023-05-24 – 2023-05-26 (×7): 1 mg via INTRAVENOUS
  Filled 2023-05-20 (×3): qty 1
  Filled 2023-05-20: qty 0.5
  Filled 2023-05-20 (×4): qty 1
  Filled 2023-05-20: qty 0.5
  Filled 2023-05-20 (×3): qty 1
  Filled 2023-05-20: qty 0.5
  Filled 2023-05-20 (×2): qty 1
  Filled 2023-05-20: qty 0.5
  Filled 2023-05-20 (×6): qty 1
  Filled 2023-05-20 (×2): qty 0.5

## 2023-05-20 MED ORDER — CLINIMIX/DEXTROSE (8/10) 8 % IV SOLN
INTRAVENOUS | Status: AC
  Filled 2023-05-20: qty 2000

## 2023-05-20 MED ORDER — POTASSIUM CHLORIDE CRYS ER 20 MEQ PO TBCR
40.0000 meq | EXTENDED_RELEASE_TABLET | Freq: Once | ORAL | Status: AC
  Administered 2023-05-20: 40 meq via ORAL
  Filled 2023-05-20: qty 2

## 2023-05-20 MED ORDER — METOCLOPRAMIDE HCL 5 MG/ML IJ SOLN
5.0000 mg | Freq: Four times a day (QID) | INTRAMUSCULAR | Status: DC | PRN
  Administered 2023-05-23 – 2023-05-26 (×8): 5 mg via INTRAVENOUS
  Filled 2023-05-20 (×9): qty 2

## 2023-05-20 MED ORDER — POTASSIUM CHLORIDE 10 MEQ/50ML IV SOLN
10.0000 meq | INTRAVENOUS | Status: AC
  Administered 2023-05-20 (×4): 10 meq via INTRAVENOUS
  Filled 2023-05-20 (×4): qty 50

## 2023-05-20 MED ORDER — METHOCARBAMOL 500 MG PO TABS
750.0000 mg | ORAL_TABLET | Freq: Four times a day (QID) | ORAL | Status: DC
  Administered 2023-05-20 – 2023-05-26 (×20): 750 mg via ORAL
  Filled 2023-05-20 (×21): qty 2

## 2023-05-20 MED ORDER — ONDANSETRON HCL 4 MG/2ML IJ SOLN
4.0000 mg | Freq: Four times a day (QID) | INTRAMUSCULAR | Status: DC | PRN
  Administered 2023-05-20 – 2023-05-26 (×10): 4 mg via INTRAVENOUS
  Filled 2023-05-20 (×11): qty 2

## 2023-05-20 MED ORDER — MAGNESIUM SULFATE 2 GM/50ML IV SOLN
2.0000 g | Freq: Once | INTRAVENOUS | Status: AC
  Administered 2023-05-20: 2 g via INTRAVENOUS
  Filled 2023-05-20: qty 50

## 2023-05-20 MED ORDER — POLYETHYLENE GLYCOL 3350 17 G PO PACK
17.0000 g | PACK | Freq: Every day | ORAL | Status: DC
  Administered 2023-05-20 – 2023-05-21 (×2): 17 g via ORAL
  Filled 2023-05-20 (×2): qty 1

## 2023-05-20 NOTE — TOC Progression Note (Signed)
Transition of Care [facility]) - Progression Note    Patient Details  Name: Kathleen [name] MRN: [id number] Date of Birth: 1945/07/05  Transition of Care [facility]) CM/SW Contact  Mearl Latin, LCSW Phone Number: 05/20/2023, 2:04 PM  Clinical Narrative:    CSW continuing to follow.    Expected Discharge Plan: Skilled Nursing Facility Barriers to Discharge: Continued Medical Work up, SNF Pending bed offer  Expected Discharge Plan and Services In-house Referral: Clinical Social Work   Post Acute Care Choice: Skilled Nursing Facility Living arrangements for the past 2 months: Independent Living Facility ([facility])                                       Social Determinants of Health (SDOH) Interventions SDOH Screenings   Food Insecurity: No Food Insecurity (04/22/2023)  Housing: Low Risk  (04/22/2023)  Transportation Needs: No Transportation Needs (04/22/2023)  Utilities: Not At Risk (04/22/2023)  Alcohol Screen: Low Risk  (05/21/2022)  Depression (PHQ2-9): Low Risk  (05/21/2022)  Financial Resource Strain: Low Risk  (05/21/2022)  Physical Activity: Insufficiently Active (05/21/2022)  Social Connections: Moderately Isolated (05/21/2022)  Stress: No Stress Concern Present (05/21/2022)  Tobacco Use: Medium Risk (04/27/2023)    Readmission Risk Interventions    05/18/2023   12:10 PM 04/26/2023    9:51 AM 01/14/2023   11:16 AM  Readmission Risk Prevention Plan  Transportation Screening Complete Complete Complete  PCP or Specialist Appt within 5-7 Days   Complete  PCP or Specialist Appt within 3-5 Days  Complete   Home Care Screening   Complete  Medication Review (RN CM)   Complete  HRI or Home Care Consult  Complete   Palliative Care Screening  --   Medication Review (RN Care Manager) Complete Complete   PCP or Specialist appointment within 3-5 days of discharge Complete    HRI or Home Care Consult Complete    SW Recovery Care/Counseling Consult Complete     Palliative Care Screening Not Applicable    Skilled Nursing Facility Complete

## 2023-05-20 NOTE — Progress Notes (Signed)
Patient ID: Kathleen [name], female   DOB: Jan 28, 1946, 77 y.o.   MRN: [id number] [facility] Surgery Progress Note  23 Days Post-Op  Subjective: CC-  Patient reports having a lot of abdominal pain over night. Ongoing nausea, no emesis. She did eat a little better yesterday. She drank 2 protein shakes and ate a few bites of her meals. No ostomy output. No emesis. WBC WNL, VSS  Objective: Vital signs in last 24 hours: Temp:  [97.6 F (36.4 C)-98 F (36.7 C)] 97.7 F (36.5 C) (12/12 0337) Pulse Rate:  [71-79] 73 (12/12 0028) Resp:  [12-23] 17 (12/12 0200) BP: (91-146)/(50-67) 132/62 (12/12 0337) SpO2:  [98 %-100 %] 98 % (12/12 0028) Weight:  [94.1 kg] 94.1 kg (12/12 0337) Last BM Date : 05/19/23  Intake/Output from previous day: 12/11 0701 - 12/12 0700 In: 1985.1 [P.O.:1053; I.V.:932.1] Out: 5 [Drains:5] Intake/Output this shift: No intake/output data recorded.  PE: Gen:  Alert, NAD Card:  RRR Pulm:  rate and effort normal on supplemental O2 via [place] Abd: soft, obese, nondistended, mild upper abdominal tenderness and tenderness at incision, open midline stable with few areas of breakdown and separation but fascia appears intact/ otherwise mostly healthy pink granulation tissue, Stoma viable but difficult to visualize through pouch, small amount of thin brown stool in bag. Stoma digitized and is patent. JP#1 and #2 scant serous fluid   Lab Results:  Recent Labs    05/19/23 0206 05/20/23 0259  WBC 9.1 8.1  HGB 8.1* 8.3*  HCT 26.0* 26.6*  PLT 198 217   BMET Recent Labs    05/20/23 0259  NA 134*  K 3.4*  CL 97*  CO2 29  GLUCOSE 179*  BUN 25*  CREATININE 0.54  CALCIUM 10.3   PT/INR No results for input(s): "LABPROT", "INR" in the last 72 hours. CMP     Component Value Date/Time   NA 134 (L) 05/20/2023 0259   NA 130 (L) 12/28/2012 0748   K 3.4 (L) 05/20/2023 0259   K 4.6 12/28/2012 0748   CL 97 (L) 05/20/2023 0259   CL 95 (L) 12/28/2012 0748   CO2 29  05/20/2023 0259   CO2 23 12/28/2012 0748   GLUCOSE 179 (H) 05/20/2023 0259   GLUCOSE 391 (H) 12/28/2012 0748   BUN 25 (H) 05/20/2023 0259   BUN 32 (H) 12/28/2012 0748   CREATININE 0.54 05/20/2023 0259   CREATININE 0.86 01/27/2023 1137   CALCIUM 10.3 05/20/2023 0259   CALCIUM 14.3 (HH) 06/26/2019 1618   PROT 5.4 (L) 05/20/2023 0259   ALBUMIN 2.4 (L) 05/20/2023 0259   AST 11 (L) 05/20/2023 0259   ALT 8 05/20/2023 0259   ALKPHOS 51 05/20/2023 0259   BILITOT 0.7 05/20/2023 0259   GFRNONAA >60 05/20/2023 0259   GFRNONAA 43 (L) 12/28/2012 0748   GFRAA 58 (L) 06/28/2019 0427   GFRAA 50 (L) 12/28/2012 0748   Lipase     Component Value Date/Time   LIPASE 30 04/22/2023 1840       Studies/Results: DG Abd Portable 1V Result Date: 05/19/2023 CLINICAL DATA:  Abdominal pain EXAM: PORTABLE ABDOMEN - 1 VIEW COMPARISON:  05/16/2023 CT FINDINGS: Scattered large and small bowel gas is noted. Left lower quadrant ostomy is seen. Contrast is noted within the rectal stump. Multiple surgical drains are seen in place. No free air is seen. IMPRESSION: No acute abnormality noted. Electronically Signed   By: Alcide Clever M.D.   On: 05/19/2023 19:02    Anti-infectives: Anti-infectives (From  admission, onward)    Start     Dose/Rate Route Frequency Ordered Stop   05/10/23 1000  piperacillin-tazobactam (ZOSYN) IVPB 3.375 g  Status:  Discontinued        3.375 g 12.5 mL/hr over 240 Minutes Intravenous Every 8 hours 05/10/23 0914 05/17/23 1317   05/03/23 1400  piperacillin-tazobactam (ZOSYN) IVPB 3.375 g        3.375 g 12.5 mL/hr over 240 Minutes Intravenous Every 8 hours 05/03/23 0935 05/07/23 0124   04/27/23 1021  sodium chloride 0.9 % with cefoTEtan (CEFOTAN) ADS Med       Note to Pharmacy: Shanda Bumps M: cabinet override      04/27/23 1021 04/27/23 1253   04/26/23 0800  cefoTEtan (CEFOTAN) 2 g in sodium chloride 0.9 % 100 mL IVPB  Status:  Discontinued        2 g 200 mL/hr over 30 Minutes  Intravenous To [facility] 04/23/23 2111 04/27/23 0800   04/22/23 2300  piperacillin-tazobactam (ZOSYN) IVPB 3.375 g        3.375 g 12.5 mL/hr over 240 Minutes Intravenous Every 8 hours 04/22/23 2256 05/02/23 2359   04/22/23 1900  piperacillin-tazobactam (ZOSYN) IVPB 3.375 g        3.375 g 100 mL/hr over 30 Minutes Intravenous  Once 04/22/23 1852 04/22/23 1954        Assessment/Plan POD#23 S/p Exploratory laparotomy, sigmoid colectomy, colostomy 11/19/24Dwain Sarna - CT 12/8 fairly unchanged with mild ileus and small 3.8x2.2cm fluid collection near colonic stump.  JP drain x2 previously purulent, they are serous today and minimal output. Continue drains. - WBC WNL, afebrile. Off antibiotics - Continues to have abdominal pain. No ostomy output last 24hr but she has had no emesis, is tolerating some PO, and abdominal film looks ok. WBC WNL, VSS.  Increase robaxin to 750mg . Transition to oral pain medications first and wean dilaudid. Add miralax. Try reglan PRN nausea. - Continue 1/2 TPN today and monitor PO intake - WOC following for new ostomy. Stoma with some mucocutaneous separation, monitor. Will need ostomy clinic referral at discharge  - vac unable to hold seal and was removed, continue BID wet to dry dressing changes - oob, pulm toilet   ID zosyn 11/14>>11/29, 12/2>>12/9 VTE - xarelto for afib  FEN - 1/2 TPN, soft, Ensure    LOS: 28 days    Franne Forts, [facility] 05/20/2023, 8:39 AM Please see Amion for pager number during day hours 7:00am-4:30pm

## 2023-05-20 NOTE — Progress Notes (Signed)
Mobility Specialist Progress Note;   05/20/23 1030  Mobility  Activity Transferred to/from [facility];Transferred from bed to chair  Level of Assistance Contact guard assist, steadying assist  Assistive Device Other (Comment) (HHA)  Distance Ambulated (ft) 5 ft  Activity Response Tolerated well  Mobility Referral Yes  Mobility visit 1 Mobility  Mobility Specialist Start Time (ACUTE ONLY) 1030  Mobility Specialist Stop Time (ACUTE ONLY) 1045  Mobility Specialist Time Calculation (min) (ACUTE ONLY) 15 min   Pt agreeable to mobility. Required MinG assistance during transfer for safety. Requested assistance to [facility] during session, void successful. Deferred further mobility d/t pain. Pt left in chair with all needs met.   Caesar Bookman Mobility Specialist Please contact via SecureChat or Rehab Office [phone]

## 2023-05-20 NOTE — Progress Notes (Signed)
PROGRESS NOTE        PATIENT DETAILS Name: Kathleen [name] Age: 77 y.o. Sex: female Date of Birth: 10-Jun-1945 Admit Date: 04/22/2023 Admitting Physician Gery Pray, MD [id number], Bernadene Bell, MD  Brief Summary:  Patient is a 77 y.o.  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  Admitted by [facility]-started on empiric IV antibiotics-underwent preop assessment by cardiology-and then subsequently underwent  exploratory laparotomy with sigmoid colectomy and end ostomy on 11/19.  Postoperative course complicated by ileus-requiring NG tube decompression-and TNA for nutrition.  See below for further details.  Significant events: 11/14>> admit to [facility] 11/19>>exploratory laparotomy with sigmoid colectomy and end ostomy  12/04>> NG removed-clear liquid started  Significant studies: 11/14>> CT abdomen/pelvis: Worsening signal right diverticulitis-localized perforation-5.6 cm perirectal abscess, 6.0 cm left lower quadrant abscess. 11/22>> x-ray abdomen: Consistent with postoperative ileus. 11/25>> CT abdomen/pelvis: No abscess 12/01>> CT abdomen/pelvis: Mild small bowel ileus-no obvious abscess-small fluid collection in the colonic stump  Significant microbiology data: 11/14>> blood culture: No growth 11/14>> COVID/influenza/RSV PCR: Negative  Procedures: 11/19>>exploratory laparotomy with sigmoid colectomy  end ostomy on 11/19  Consults: Cardiology GI General surgery  Subjective: Normal pain continues but appears comfortable-oral intake is not that great but is tolerating advancement in diet.  Brown stools in ostomy today.  Objective: Vitals: Blood pressure 132/62, pulse 73, temperature 97.7 F (36.5 C), temperature source Oral, resp. rate 17, height 5'  (1.524 m), weight 94.1 kg, SpO2 100%.   Exam: Abdomen is soft-brown stool in ostomy.  Drain in place.   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  Hypokalemia/hypophosphatemia/hypomagnesemia Replete per pharmacy TNA protocol.  Normocytic anemia Secondary to critical illness No evidence of blood loss Transfused 1 additional unit of PRBC on 12/10 Continue to follow CBC periodically.  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  GERD PPI  DM-2 (A1c 6.2 on 11/15) CBGs stable on SSI  No results for input(s): "GLUCAP" in the last 72 hours.  Nutrition Status: Nutrition Problem:  Moderate Malnutrition Etiology: chronic illness Signs/Symptoms: mild fat depletion, mild muscle depletion, energy intake < 75% for > 7 days Interventions: Ensure Enlive (each supplement provides 350kcal and 20 grams of protein), Magic cup, MVI  Obesity: Estimated body mass index is 40.52 kg/m as calculated from the following:   Height as of this encounter: 5' (1.524 m).   Weight as of this encounter: 94.1 kg.   Code status:   Code Status: Limited: Do not attempt resuscitation (DNR) -DNR-LIMITED -Do Not Intubate/DNI    DVT Prophylaxis: IV heparinrivaroxaban (XARELTO) tablet 20 mg    Family Communication: None at bedside.   Disposition Plan: Status is: Inpatient Remains inpatient appropriate because: Severity of illness   Planned Discharge Destination:Skilled nursing facility   Diet: Diet Order             DIET SOFT Fluid consistency: Thin  Diet effective now                     Antimicrobial agents: Anti-infectives (From admission, onward)    Start     Dose/Rate Route Frequency Ordered Stop   05/10/23 1000  piperacillin-tazobactam (ZOSYN) IVPB 3.375 g  Status:  Discontinued        3.375 g 12.5 mL/hr over 240 Minutes Intravenous Every 8 hours 05/10/23 0914 05/17/23 1317   05/03/23 1400  piperacillin-tazobactam (ZOSYN) IVPB 3.375 g        3.375 g 12.5 mL/hr over 240 Minutes Intravenous Every 8 hours 05/03/23 0935 05/07/23 0124   04/27/23 1021  sodium chloride 0.9 % with cefoTEtan (CEFOTAN) ADS Med       Note to Pharmacy: Shanda Bumps M: cabinet override      04/27/23 1021 04/27/23 1253   04/26/23 0800  cefoTEtan (CEFOTAN) 2 g in sodium chloride 0.9 % 100 mL IVPB  Status:  Discontinued        2 g 200 mL/hr over 30 Minutes Intravenous To [facility] 04/23/23 2111 04/27/23 0800   04/22/23 2300  piperacillin-tazobactam (ZOSYN) IVPB 3.375 g        3.375 g 12.5 mL/hr over 240 Minutes Intravenous Every 8 hours 04/22/23 2256 05/02/23 2359   04/22/23 1900   piperacillin-tazobactam (ZOSYN) IVPB 3.375 g        3.375 g 100 mL/hr over 30 Minutes Intravenous  Once 04/22/23 1852 04/22/23 1954        MEDICATIONS: Scheduled Meds:  (feeding supplement) PROSource Plus  30 mL Oral BID BM   sodium chloride   Intravenous Once   acetaminophen  650 mg Oral Q6H   Chlorhexidine Gluconate Cloth  6 each Topical Daily   docusate sodium  100 mg Oral BID   dofetilide  250 mcg Oral Q12H   feeding supplement  237 mL Oral TID BM   fluticasone furoate-vilanterol  1 puff Inhalation Daily   gabapentin  200 mg Oral QHS   lidocaine  1 patch Transdermal Q24H   magic mouthwash  5 mL Oral QID   magnesium oxide  400 mg Oral BID   methocarbamol  750 mg Oral Q6H   multivitamin with minerals  1 tablet Oral Daily   nystatin   Topical BID   mouth rinse  15 mL Mouth Rinse 4 times per day   pantoprazole  40 mg Oral BID   polyethylene glycol  17 g Oral Daily   revefenacin  175 mcg Nebulization Daily  rivaroxaban  20 mg Oral Q supper   spironolactone  25 mg Oral Daily   torsemide  40 mg Oral Daily   Continuous Infusions:  potassium chloride 10 mEq (05/20/23 0940)   TPN (CLINIMIX) Adult without lytes     TPN (CLINIMIX-E) Adult 42 mL/hr at 05/19/23 1739   PRN Meds:.albuterol, guaiFENesin-dextromethorphan, HYDROmorphone (DILAUDID) injection, metoCLOPramide (REGLAN) injection, ondansetron (ZOFRAN) IV, oxyCODONE, sodium chloride flush   I have personally reviewed following labs and imaging studies  LABORATORY DATA: CBC: Recent Labs  Lab 05/16/23 0227 05/17/23 0306 05/18/23 0419 05/19/23 0206 05/20/23 0259  WBC 8.2 8.1 9.4 9.1 8.1  HGB 7.5* 7.2* 7.0* 8.1* 8.3*  HCT 24.6* 23.8* 23.0* 26.0* 26.6*  MCV 86.0 86.2 84.9 85.0 83.6  PLT 217 210 215 198 217    Basic Metabolic Panel: Recent Labs  Lab 05/14/23 0847 05/16/23 0630 05/17/23 0306 05/20/23 0259  NA 133* 131* 134* 134*  K 4.3 3.6 3.6 3.4*  CL 104 97* 101 97*  CO2 21* 23 24 29   GLUCOSE 158* 166*  165* 179*  BUN 13 21 28* 25*  CREATININE 0.47 0.63 0.66 0.54  CALCIUM 8.7* 8.8* 8.7* 10.3  MG 1.7  --  1.6* 1.7  PHOS  --   --  3.9 4.5    GFR: Estimated Creatinine Clearance: 61.3 mL/min (by C-G formula based on SCr of 0.54 mg/dL).  Liver Function Tests: Recent Labs  Lab 05/16/23 0630 05/17/23 0306 05/20/23 0259  AST 12* 10* 11*  ALT 11 10 8   ALKPHOS 58 49 51  BILITOT 0.6 0.5 0.7  PROT 5.5* 5.2* 5.4*  ALBUMIN 2.4* 2.2* 2.4*   No results for input(s): "LIPASE", "AMYLASE" in the last 168 hours. No results for input(s): "AMMONIA" in the last 168 hours.  Coagulation Profile: No results for input(s): "INR", "PROTIME" in the last 168 hours.  Cardiac Enzymes: No results for input(s): "CKTOTAL", "CKMB", "CKMBINDEX", "TROPONINI" in the last 168 hours.  BNP (last 3 results) No results for input(s): "PROBNP" in the last 8760 hours.  Lipid Profile: No results for input(s): "CHOL", "HDL", "LDLCALC", "TRIG", "CHOLHDL", "LDLDIRECT" in the last 72 hours.     Thyroid Function Tests: No results for input(s): "TSH", "T4TOTAL", "FREET4", "T3FREE", "THYROIDAB" in the last 72 hours.  Anemia Panel: No results for input(s): "VITAMINB12", "FOLATE", "FERRITIN", "TIBC", "IRON", "RETICCTPCT" in the last 72 hours.  Urine analysis:    Component Value Date/Time   COLORURINE AMBER (A) 04/23/2023 0623   APPEARANCEUR CLOUDY (A) 04/23/2023 0623   APPEARANCEUR Hazy 12/25/2012 1653   LABSPEC 1.024 04/23/2023 0623   LABSPEC 1.021 12/25/2012 1653   PHURINE 5.0 04/23/2023 0623   GLUCOSEU NEGATIVE 04/23/2023 0623   GLUCOSEU NEGATIVE 03/17/2023 1003   HGBUR MODERATE (A) 04/23/2023 0623   BILIRUBINUR NEGATIVE 04/23/2023 0623   BILIRUBINUR Negative 12/25/2012 1653   KETONESUR NEGATIVE 04/23/2023 0623   PROTEINUR 30 (A) 04/23/2023 0623   UROBILINOGEN 0.2 03/17/2023 1003   NITRITE NEGATIVE 04/23/2023 0623   LEUKOCYTESUR LARGE (A) 04/23/2023 0623   LEUKOCYTESUR Negative 12/25/2012 1653     Sepsis Labs: Lactic Acid, Venous    Component Value Date/Time   LATICACIDVEN 0.8 04/23/2023 0114    MICROBIOLOGY: No results found for this or any previous visit (from the past 240 hours).   RADIOLOGY STUDIES/RESULTS: DG Abd Portable 1V Result Date: 05/19/2023 CLINICAL DATA:  Abdominal pain EXAM: PORTABLE ABDOMEN - 1 VIEW COMPARISON:  05/16/2023 CT FINDINGS: Scattered large and small bowel gas is noted. Left lower quadrant  ostomy is seen. Contrast is noted within the rectal stump. Multiple surgical drains are seen in place. No free air is seen. IMPRESSION: No acute abnormality noted. Electronically Signed   By: Alcide Clever M.D.   On: 05/19/2023 19:02     LOS: 28 days   Jeoffrey Massed, MD  [facility]    To contact the attending provider between 7A-7P or the covering provider during after hours 7P-7A, please log into the web site [email or web address] and access using universal  password for that web site. If you do not have the password, please call the hospital operator.  05/20/2023, 10:57 AM

## 2023-05-20 NOTE — Progress Notes (Signed)
PHARMACY - TOTAL PARENTERAL NUTRITION CONSULT NOTE  Indication: Prolonged ileus  Patient Measurements: Height: 5' (152.4 cm) Weight: 94.1 kg (207 lb 7.3 oz) IBW/kg (Calculated) : 45.5 TPN AdjBW (KG): 54 Body mass index is 40.52 kg/m. Weight trends: 79.3kg (11/21) >>93.2 kg 12/11  Assessment:  54 YOF with recurrent sigmoid diverticulitis and associated colovaginal fistula, HFrEF, COPD, Afib with PPM, and gout who was admitted with persistent symptoms with multiple abscesses s/p ex lap with sigmoid colectomy and end left colostomy on 11/19. Patient has been NPO since surgery. Pharmacy consulted to manage TPN.  Weights have been stable in the last year ~182-194 lbs. Patient doesn't eat much at home, only 2 meals a day. For breakfast she might eat grits, an egg and coffee. For dinner she cooks her own food (doesn't like the food at facility) and might include chicken, green beans, carrots and rice. She does drink chocolate Boost regularly and thinks Boost tastes better than Ensure. She does not like vanilla or berry flavored supplements. Unfortunately, we only have Boost in wild berry.  Glucose / Insulin: hx T2DM, A1c 6.2%; no meds PTA - AM glucose < 180 Electrolytes: low Na/CL (stable), K down to 3.4, Mag up to 1.7, CoCa elevated at 11.6 (Ca x Phos = 52, goal < 55), others WNL Renal: SCr < 1, BUN 20s Hepatic: LFTs / tbili / TG WNL, albumin 2.4 Intake / Output; MIVF: no UOP charted on torsemide and spironolactone, JP drain 5mL (s/p Reglan 12/2>>12/8) GI Imaging: 11/21 KUB moderately dilated loops of small bowel, c/w post-operative ileus 11/23 KUB: persistent SBO or ileus, no significant change from prior 11/25 KUB: bowel obstruction or ileus, pleural effusion vs atelectasis 11/25 CTAP: post-op ileus or distal SBO, trace L pleural effusion 11/26 CXR: L > R lower lobe atelectasis 11/29 Abd XR: distention of SB concerning for obstruction or worsening postop ileus 12/1 CT: mild ileus 12/6 KUB:  improved but not normalized ileus-like bowel gas pattern 12/8 CT:  consistent with ileus, mildly improved from the prior  12/11 KUB: no acute abnormality  GI Surgeries / Procedures:  11/19 ex lap with sigmoid colectomy and end left colostomy  Central access: PICC placed 04/30/23 TPN start date: 04/30/23  Nutritional Goals: Clinimix E 8/10 1L at 42 ml/hr with SMOF lipids on MTWThFSa Clinimix E 8/10 2L at 82 ml/hr on Sun with SMOFlipids - Average of 1258 kCals (~80% of goal) and 91g AA per day (100% goal) Previously on D10 fluids for add'l kCal >> stopped on 11/26 given concerns for pleural effusions/atelectasis noted on imaging, weights increased requiring Lasix IV - this has resolved so restarted D10 at 11/29, then changed to sodium bicarb D5 gtt   Electrolytes in Clinimix E 8/10 1L bag: Na 35 mEq, K 30 mEq, Mag 5 mEq, Ca 4.5 mEq, Phos , Acetate 83, Cl 76 mEq  RD Estimated Needs Total Energy Estimated Needs: 1600-1800 kcal Total Protein Estimated Needs: 75-95 gm Total Fluid Estimated Needs: >1.6L  Current Nutrition:  TPN (1/2 TPN since 12/11) Soft diet since 12/10: minimal intake, tough meat.  Nausea and food choice limit intake. *see 12/11 TPN note for more detailed PO intake on a daily basis Ensure Enlive TID - 3 charted given yesterday   Plan:  Clinimix without lytes 8/10 at 42 mL/hr with no SMOF lipids to provide 660 kCal and 80g AA per day Received SMOFlipid daily from 11/22-12/11 - monitor for EFAD PO multivitamin daily (no MVI or trace elements in TPN) KCL  x 4 runs and KCL PO Mag sulfate 2gm IV x 1.  MagOx 400mg  PO BID per MD (started 12/10) Monitor TPN labs Mon/Thur and PRN - labs in AM F/U oral intake and ability to wean off of TPN vs increasing back to goal  Herbie Lehrmann D. Laney Potash, PharmD, BCPS, BCCCP 05/20/2023, 8:44 AM

## 2023-05-20 NOTE — Consult Note (Addendum)
WOC Nurse ostomy Follow up note Stoma type/location: Stoma is red and viable, 1 1/2 inches, slightly above skin level.  stoma separation 5`clock with 1 cm tunneling, 1.2x0.5 cm, 30ml stools.   Pt is was sitting, and when the stoma was functioning, she continue complain about the pain on th left side of the abd, as a cramp surrounding the abd, sometimes change the position.  Orientated ostomy care: - Change the barrier each 3 to 5 days. - Empty the pouch when 1/3 full. - Use water and soap to clean the periostomy skin. - Be careful to not contaminated the surgical wound when take off the barrier, do separately the dressing. -Cut respecting the size and shape of the ostomy.  - Will be helpful to use a mirror during the procedure, the pt said that she is not able to look. - Apply the barrier on the skin, check in if is attached to the skin correctly, surrounding the stoma. - Attach the bag, as a ziplock, do not need to press the abd. Open and close the pouch if is needed.  Obs: her son watched part of the orientation and he is want to help.   Applied pouch as follows: Barrier ring and 2 piece convex pouching system.  Use Supplies: Apply Aquacel on the stoma separation before. Use Barrier ring [id number] Pouch [id number] Barrier [id number].    Thank-you,  Denyse Amass BSN, RN, [employer], WOC  (Pager: [phone])

## 2023-05-21 DIAGNOSIS — E1140 Type 2 diabetes mellitus with diabetic neuropathy, unspecified: Secondary | ICD-10-CM | POA: Diagnosis not present

## 2023-05-21 DIAGNOSIS — K5720 Diverticulitis of large intestine with perforation and abscess without bleeding: Secondary | ICD-10-CM | POA: Diagnosis not present

## 2023-05-21 DIAGNOSIS — E039 Hypothyroidism, unspecified: Secondary | ICD-10-CM | POA: Diagnosis not present

## 2023-05-21 DIAGNOSIS — E440 Moderate protein-calorie malnutrition: Secondary | ICD-10-CM | POA: Diagnosis not present

## 2023-05-21 LAB — BASIC METABOLIC PANEL
Anion gap: 8 (ref 5–15)
BUN: 28 mg/dL — ABNORMAL HIGH (ref 8–23)
CO2: 29 mmol/L (ref 22–32)
Calcium: 9.9 mg/dL (ref 8.9–10.3)
Chloride: 94 mmol/L — ABNORMAL LOW (ref 98–111)
Creatinine, Ser: 0.54 mg/dL (ref 0.44–1.00)
GFR, Estimated: >60 mL/min (ref >60)
Glucose, Bld: 213 mg/dL — ABNORMAL HIGH (ref 70–99)
Potassium: 3.9 mmol/L (ref 3.5–5.1)
Sodium: 131 mmol/L — ABNORMAL LOW (ref 135–145)

## 2023-05-21 LAB — PHOSPHORUS: Phosphorus: 3.3 mg/dL (ref 2.5–4.6)

## 2023-05-21 LAB — MAGNESIUM: Magnesium: 1.7 mg/dL (ref 1.7–2.4)

## 2023-05-21 MED ORDER — CLINIMIX E/DEXTROSE (8/10) 8 % IV SOLN
INTRAVENOUS | Status: AC
  Filled 2023-05-21 (×2): qty 1000

## 2023-05-21 MED ORDER — POTASSIUM CHLORIDE CRYS ER 20 MEQ PO TBCR
40.0000 meq | EXTENDED_RELEASE_TABLET | Freq: Once | ORAL | Status: AC
Start: 2023-05-21 — End: 2023-05-21
  Administered 2023-05-21: 40 meq via ORAL
  Filled 2023-05-21: qty 2

## 2023-05-21 MED ORDER — POLYETHYLENE GLYCOL 3350 17 G PO PACK
17.0000 g | PACK | Freq: Two times a day (BID) | ORAL | Status: DC
  Administered 2023-05-21: 17 g via ORAL
  Filled 2023-05-21 (×3): qty 1

## 2023-05-21 MED ORDER — MAGNESIUM SULFATE 4 GM/100ML IV SOLN
4.0000 g | Freq: Once | INTRAVENOUS | Status: AC
  Administered 2023-05-21: 4 g via INTRAVENOUS
  Filled 2023-05-21: qty 100

## 2023-05-21 NOTE — Progress Notes (Signed)
Physical Therapy Treatment Patient Details Name: Kathleen [name] MRN: [id number] DOB: 1945-12-20 Today's Date: 05/21/2023   History of Present Illness 77 y.o. 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 S/p exploratory laparotomy with sigmoid colectomy and end left colostomy by Dr. Dwain Sarna on 04/27/2023.  PMHx: colovaginal fistula associated with diverticulitis, obesity, OSA , BOOP, tracheobronchomalacia, chronic respiratory failure on 4 to 5 L oxygen at baseline, persistent A-fib S/P pacemaker implant 2017, chronic systolic heart failure, recent admission from 04/09/2023 to 04/13/2023.    PT Comments  Pt received sitting in the recliner and agreeable to session. Upon standing from chair, blood noted on washcloth under pt, RN notified and present to assess. Pt requests to use the BSC at the beginning of the session and is able to void and complete pericare with increased time, but no physical assist. Pt able to tolerate increased gait distance this session with a seated rest break due to pt reporting dizziness. Pt continues to be limited by fatigue and abdominal pain with mobility. Pt continues to benefit from PT services to progress toward functional mobility goals.     If plan is discharge home, recommend the following: A lot of help with walking and/or transfers;A lot of help with bathing/dressing/bathroom;Assistance with cooking/housework;Assist for transportation   Can travel by private vehicle     No  Equipment Recommendations  None recommended by PT    Recommendations for Other Services       Precautions / Restrictions Precautions Precautions: Fall Precaution Comments: multiple drains, colostomy Restrictions Weight Bearing Restrictions Per Provider Order: No     Mobility  Bed Mobility Overal bed mobility: Needs Assistance Bed Mobility: Sit to Supine       Sit to supine: Used rails, Min assist   General bed mobility comments:  Min A for BLE elevation to EOB and cues for logroll technique for decreased abdominal pain    Transfers Overall transfer level: Needs assistance Equipment used: None, Rollator (4 wheels) Transfers: Sit to/from Stand, Bed to chair/wheelchair/BSC Sit to Stand: Supervision   Step pivot transfers: Contact guard assist       General transfer comment: Pt demonstrates good hand placement without cues. CGA to pivot to/from [facility] without AD    Ambulation/Gait Ambulation/Gait assistance: Contact guard assist Gait Distance (Feet): 100 Feet Assistive device: Rollator (4 wheels) Gait Pattern/deviations: Step-through pattern, Trunk flexed, Decreased stride length Gait velocity: decreased     General Gait Details: slow steady gait with cues for upright posture and rollator proximity. 2 standing rest breaks due to fatigue and one seated break due to dizziness       Balance Overall balance assessment: Needs assistance Sitting-balance support: No upper extremity supported, Feet supported Sitting balance-Leahy Scale: Good     Standing balance support: During functional activity, Bilateral upper extremity supported, Reliant on assistive device for balance Standing balance-Leahy Scale: Poor Standing balance comment: with rollator support                            Cognition Arousal: Alert Behavior During Therapy: WFL for tasks assessed/performed Overall Cognitive Status: Within Functional Limits for tasks assessed                                          Exercises      General Comments  Pertinent Vitals/Pain Pain Assessment Pain Assessment: 0-10 Pain Score: 8  Pain Location: abdomen and back Pain Descriptors / Indicators: Grimacing, Discomfort, Crying Pain Intervention(s): Monitored during session, Repositioned     PT Goals (current goals can now be found in the care plan section) Acute Rehab PT Goals Patient Stated Goal: Reduce pain PT  Goal Formulation: With patient Time For Goal Achievement: 05/12/23 Progress towards PT goals: Progressing toward goals    Frequency    Min 1X/week       AM-PAC PT "6 Clicks" Mobility   Outcome Measure  Help needed turning from your back to your side while in a flat bed without using bedrails?: None Help needed moving from lying on your back to sitting on the side of a flat bed without using bedrails?: A Little Help needed moving to and from a bed to a chair (including a wheelchair)?: A Little Help needed standing up from a chair using your arms (e.g., wheelchair or bedside chair)?: A Little Help needed to walk in hospital room?: A Little Help needed climbing 3-5 steps with a railing? : A Lot 6 Click Score: 18    End of Session Equipment Utilized During Treatment: Gait belt;Oxygen Activity Tolerance: Patient tolerated treatment well;Patient limited by fatigue Patient left: in bed;with call bell/phone within reach;with family/visitor present Nurse Communication: Mobility status PT Visit Diagnosis: Unsteadiness on feet (R26.81);Muscle weakness (generalized) (M62.81);Difficulty in walking, not elsewhere classified (R26.2);Pain     Time: 1411-1458 PT Time Calculation (min) (ACUTE ONLY): 47 min  Charges:    $Gait Training: 23-37 mins $Therapeutic Activity: 8-22 mins PT General Charges $$ ACUTE PT VISIT: 1 Visit                     Johny Shock, PTA Acute Rehabilitation Services Secure Chat Preferred  [phone]    Johny Shock 05/21/2023, 3:08 PM

## 2023-05-21 NOTE — Plan of Care (Signed)
  Problem: Education: Goal: Ability to describe self-care measures that may prevent or decrease complications (Diabetes Survival Skills Education) will improve Outcome: Progressing Goal: Individualized Educational Video(s) Outcome: Progressing   Problem: Coping: Goal: Ability to adjust to condition or change in health will improve Outcome: Progressing   Problem: Fluid Volume: Goal: Ability to maintain a balanced intake and output will improve Outcome: Progressing   Problem: Health Behavior/Discharge Planning: Goal: Ability to identify and utilize available resources and services will improve Outcome: Progressing Goal: Ability to manage health-related needs will improve Outcome: Progressing   Problem: Metabolic: Goal: Ability to maintain appropriate glucose levels will improve Outcome: Progressing   Problem: Nutritional: Goal: Maintenance of adequate nutrition will improve Outcome: Progressing Goal: Progress toward achieving an optimal weight will improve Outcome: Progressing   Problem: Skin Integrity: Goal: Risk for impaired skin integrity will decrease Outcome: Progressing   Problem: Tissue Perfusion: Goal: Adequacy of tissue perfusion will improve Outcome: Progressing   Problem: Education: Goal: Knowledge of General Education information will improve Description: Including pain rating scale, medication(s)/side effects and non-pharmacologic comfort measures Outcome: Progressing   Problem: Health Behavior/Discharge Planning: Goal: Ability to manage health-related needs will improve Outcome: Progressing   Problem: Clinical Measurements: Goal: Ability to maintain clinical measurements within normal limits will improve Outcome: Progressing Goal: Will remain free from infection Outcome: Progressing Goal: Diagnostic test results will improve Outcome: Progressing Goal: Cardiovascular complication will be avoided Outcome: Progressing   Problem: Activity: Goal: Risk  for activity intolerance will decrease Outcome: Progressing   Problem: Nutrition: Goal: Adequate nutrition will be maintained Outcome: Progressing   Problem: Coping: Goal: Level of anxiety will decrease Outcome: Progressing   Problem: Elimination: Goal: Will not experience complications related to bowel motility Outcome: Progressing Goal: Will not experience complications related to urinary retention Outcome: Progressing   Problem: Pain Management: Goal: General experience of comfort will improve Outcome: Progressing   Problem: Safety: Goal: Ability to remain free from injury will improve Outcome: Progressing   Problem: Skin Integrity: Goal: Risk for impaired skin integrity will decrease Outcome: Progressing   Problem: Education: Goal: Understanding of discharge needs will improve Outcome: Progressing Goal: Verbalization of understanding of the causes of altered bowel function will improve Outcome: Progressing   Problem: Activity: Goal: Ability to tolerate increased activity will improve Outcome: Progressing   Problem: Bowel/Gastric: Goal: Gastrointestinal status for postoperative course will improve Outcome: Progressing   Problem: Health Behavior/Discharge Planning: Goal: Identification of community resources to assist with postoperative recovery needs will improve Outcome: Progressing   Problem: Nutritional: Goal: Will attain and maintain optimal nutritional status will improve Outcome: Progressing   Problem: Clinical Measurements: Goal: Postoperative complications will be avoided or minimized Outcome: Progressing   Problem: Respiratory: Goal: Respiratory status will improve Outcome: Progressing   Problem: Skin Integrity: Goal: Will show signs of wound healing Outcome: Progressing

## 2023-05-21 NOTE — Progress Notes (Signed)
Patient ID: Kathleen [name], female   DOB: [birth year], 77 y.o.   MRN: [id number] [facility] Surgery Progress Note  24 Days Post-Op  Subjective: CC-  Reports abdominal distention and some formed stool from her ostomy bag. Reports one episode of emesis immediately after using swish and spit  Objective: Vital signs in last 24 hours: Temp:  [97.8 F (36.6 C)-98 F (36.7 C)] 97.9 F (36.6 C) (12/13 0456) Pulse Rate:  [68-83] 70 (12/13 0800) Resp:  [15-21] 15 (12/13 0800) BP: (108-138)/(61-66) 108/66 (12/13 0800) SpO2:  [97 %-100 %] 100 % (12/13 0800) Weight:  [94.1 kg] 94.1 kg (12/13 0430) Last BM Date : 05/19/23  Intake/Output from previous day: 12/12 0701 - 12/13 0700 In: 1513.6 [P.O.:500; I.V.:813.6; IV Piggyback:200] Out: -  Intake/Output this shift: No intake/output data recorded.  PE: Gen:  Alert, NAD Card:  RRR Pulm:  rate and effort normal on supplemental O2 via [place] Abd: soft, obese, mild distention, minimal tenderness around her incision, dressing in place, c/d/I, Stoma viable but difficult to visualize through pouch, small amount of thick brown stool in bag.  JP#1 and #2 scant serous fluid   Lab Results:  Recent Labs    05/19/23 0206 05/20/23 0259  WBC 9.1 8.1  HGB 8.1* 8.3*  HCT 26.0* 26.6*  PLT 198 217   BMET Recent Labs    05/20/23 0259 05/21/23 0313  NA 134* 131*  K 3.4* 3.9  CL 97* 94*  CO2 29 29  GLUCOSE 179* 213*  BUN 25* 28*  CREATININE 0.54 0.54  CALCIUM 10.3 9.9   PT/INR No results for input(s): "LABPROT", "INR" in the last 72 hours. CMP     Component Value Date/Time   NA 131 (L) 05/21/2023 0313   NA 130 (L) 12/28/2012 0748   K 3.9 05/21/2023 0313   K 4.6 12/28/2012 0748   CL 94 (L) 05/21/2023 0313   CL 95 (L) 12/28/2012 0748   CO2 29 05/21/2023 0313   CO2 23 12/28/2012 0748   GLUCOSE 213 (H) 05/21/2023 0313   GLUCOSE 391 (H) 12/28/2012 0748   BUN 28 (H) 05/21/2023 0313   BUN 32 (H) 12/28/2012 0748   CREATININE 0.54  05/21/2023 0313   CREATININE 0.86 01/27/2023 1137   CALCIUM 9.9 05/21/2023 0313   CALCIUM 14.3 (HH) 06/26/2019 1618   PROT 5.4 (L) 05/20/2023 0259   ALBUMIN 2.4 (L) 05/20/2023 0259   AST 11 (L) 05/20/2023 0259   ALT 8 05/20/2023 0259   ALKPHOS 51 05/20/2023 0259   BILITOT 0.7 05/20/2023 0259   GFRNONAA >60 05/21/2023 0313   GFRNONAA 43 (L) 12/28/2012 0748   GFRAA 58 (L) 06/28/2019 0427   GFRAA 50 (L) 12/28/2012 0748   Lipase     Component Value Date/Time   LIPASE 30 04/22/2023 1840       Studies/Results: DG Abd Portable 1V Result Date: 05/19/2023 CLINICAL DATA:  Abdominal pain EXAM: PORTABLE ABDOMEN - 1 VIEW COMPARISON:  05/16/2023 CT FINDINGS: Scattered large and small bowel gas is noted. Left lower quadrant ostomy is seen. Contrast is noted within the rectal stump. Multiple surgical drains are seen in place. No free air is seen. IMPRESSION: No acute abnormality noted. Electronically Signed   By: Alcide Clever M.D.   On: 05/19/2023 19:02    Anti-infectives: Anti-infectives (From admission, onward)    Start     Dose/Rate Route Frequency Ordered Stop   05/10/23 1000  piperacillin-tazobactam (ZOSYN) IVPB 3.375 g  Status:  Discontinued  3.375 g 12.5 mL/hr over 240 Minutes Intravenous Every 8 hours 05/10/23 0914 05/17/23 1317   05/03/23 1400  piperacillin-tazobactam (ZOSYN) IVPB 3.375 g        3.375 g 12.5 mL/hr over 240 Minutes Intravenous Every 8 hours 05/03/23 0935 05/07/23 0124   04/27/23 1021  sodium chloride 0.9 % with cefoTEtan (CEFOTAN) ADS Med       Note to Pharmacy: Shanda Bumps M: cabinet override      04/27/23 1021 04/27/23 1253   04/26/23 0800  cefoTEtan (CEFOTAN) 2 g in sodium chloride 0.9 % 100 mL IVPB  Status:  Discontinued        2 g 200 mL/hr over 30 Minutes Intravenous To ShortStay Surgical 04/23/23 2111 04/27/23 0800   04/22/23 2300  piperacillin-tazobactam (ZOSYN) IVPB 3.375 g        3.375 g 12.5 mL/hr over 240 Minutes Intravenous Every 8  hours 04/22/23 2256 05/02/23 2359   04/22/23 1900  piperacillin-tazobactam (ZOSYN) IVPB 3.375 g        3.375 g 100 mL/hr over 30 Minutes Intravenous  Once 04/22/23 1852 04/22/23 1954        Assessment/Plan POD#24 S/p Exploratory laparotomy, sigmoid colectomy, colostomy 11/19/24Dwain Sarna - CT 12/8 fairly unchanged with mild ileus and small 3.8x2.2cm fluid collection near colonic stump.  JP drain x2 previously purulent, they are serous today and minimal output. Continue drains. - WBC WNL, afebrile. Off antibiotics - Continues to have some abdominal pain and distention. Ostomy output is thick, add miralax. KUB yesterday reassuring. Monitor for ongoing nausea/vomiting. - Reglan PRN for nausea. - Continue 1/2 TPN again today and monitor PO intake - WOC following for new ostomy. Stoma with some mucocutaneous separation, monitor. Will need ostomy clinic referral at discharge  - vac unable to hold seal and was removed, continue BID wet to dry dressing changes - oob, pulm toilet   ID zosyn 11/14>>11/29, 12/2>>12/9 VTE - xarelto for afib  FEN - 1/2 TPN, soft, Ensure    LOS: 29 days    Adam Phenix, [facility] Surgery 05/21/2023, 10:54 AM Please see Amion for pager number during day hours 7:00am-4:30pm

## 2023-05-21 NOTE — Progress Notes (Signed)
PROGRESS NOTE        PATIENT DETAILS Name: Kathleen [name] Age: 76 y.o. Sex: female Date of Birth: 1946/02/25 Admit Date: 04/22/2023 Admitting Physician Gery Pray, MD LOV:FIEPP, Bernadene Bell, MD  Brief Summary:  Patient is a 77 y.o.  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  Admitted by [facility]-started on empiric IV antibiotics-underwent preop assessment by cardiology-and then subsequently underwent  exploratory laparotomy with sigmoid colectomy and end ostomy on 11/19.  Postoperative course complicated by ileus-requiring NG tube decompression-and TNA for nutrition.  See below for further details.  Significant events: 11/14>> admit to [facility] 11/19>>exploratory laparotomy with sigmoid colectomy and end ostomy  12/04>> NG removed-clear liquid started  Significant studies: 11/14>> CT abdomen/pelvis: Worsening signal right diverticulitis-localized perforation-5.6 cm perirectal abscess, 6.0 cm left lower quadrant abscess. 11/22>> x-ray abdomen: Consistent with postoperative ileus. 11/25>> CT abdomen/pelvis: No abscess 12/01>> CT abdomen/pelvis: Mild small bowel ileus-no obvious abscess-small fluid collection in the colonic stump  Significant microbiology data: 11/14>> blood culture: No growth 11/14>> COVID/influenza/RSV PCR: Negative  Procedures: 11/19>>exploratory laparotomy with sigmoid colectomy  end ostomy on 11/19  Consults: Cardiology GI General surgery  Subjective: Dark brown stool in ostomy bag-abdominal pain is somewhat better.  Oral intake continues to be erratic.  Objective: Vitals: Blood pressure 108/66, pulse 70, temperature 97.9 F (36.6 C), temperature source Oral, resp. rate 15, height 5' (1.524 m), weight 94.1 kg, SpO2 100%.    Exam: Awake/alert Abdomen: Soft-mildly tender in the mid abdominal area-dark brown stool in ostomy.   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  Hypokalemia/hypophosphatemia/hypomagnesemia Replete per pharmacy TNA protocol.  Normocytic anemia Secondary to critical illness No evidence of blood loss Transfused 1 additional unit of PRBC on 12/10 Continue to follow CBC periodically.  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  GERD PPI  DM-2 (A1c 6.2 on 11/15) CBGs stable on SSI  No results for input(s): "  GLUCAP" in the last 72 hours.  Nutrition Status: Nutrition Problem:  Moderate Malnutrition Etiology: chronic illness Signs/Symptoms: mild fat depletion, mild muscle depletion, energy intake < 75% for > 7 days Interventions: Ensure Enlive (each supplement provides 350kcal and 20 grams of protein), Magic cup, MVI  Obesity: Estimated body mass index is 40.52 kg/m as calculated from the following:   Height as of this encounter: 5' (1.524 m).   Weight as of this encounter: 94.1 kg.   Code status:   Code Status: Limited: Do not attempt resuscitation (DNR) -DNR-LIMITED -Do Not Intubate/DNI    DVT Prophylaxis: IV heparinrivaroxaban (XARELTO) tablet 20 mg    Family Communication: None at bedside.   Disposition Plan: Status is: Inpatient Remains inpatient appropriate because: Severity of illness   Planned Discharge Destination:Skilled nursing facility   Diet: Diet Order             DIET SOFT Fluid consistency: Thin  Diet effective now                     Antimicrobial agents: Anti-infectives (From admission, onward)    Start     Dose/Rate Route Frequency Ordered Stop   05/10/23 1000  piperacillin-tazobactam (ZOSYN) IVPB 3.375 g  Status:  Discontinued        3.375 g 12.5 mL/hr over 240 Minutes Intravenous Every 8 hours 05/10/23 0914 05/17/23 1317   05/03/23 1400  piperacillin-tazobactam (ZOSYN) IVPB 3.375 g        3.375 g 12.5 mL/hr over 240 Minutes Intravenous Every 8 hours 05/03/23 0935 05/07/23 0124   04/27/23 1021  sodium chloride 0.9 % with cefoTEtan (CEFOTAN) ADS Med       Note to Pharmacy: Shanda Bumps M: cabinet override      04/27/23 1021 04/27/23 1253   04/26/23 0800  cefoTEtan (CEFOTAN) 2 g in sodium chloride 0.9 % 100 mL IVPB  Status:  Discontinued        2 g 200 mL/hr over 30 Minutes Intravenous To ShortStay Surgical 04/23/23 2111 04/27/23 0800   04/22/23 2300  piperacillin-tazobactam (ZOSYN) IVPB 3.375 g        3.375 g 12.5 mL/hr over 240 Minutes Intravenous Every 8 hours 04/22/23 2256 05/02/23 2359   04/22/23 1900   piperacillin-tazobactam (ZOSYN) IVPB 3.375 g        3.375 g 100 mL/hr over 30 Minutes Intravenous  Once 04/22/23 1852 04/22/23 1954        MEDICATIONS: Scheduled Meds:  (feeding supplement) PROSource Plus  30 mL Oral BID BM   sodium chloride   Intravenous Once   acetaminophen  650 mg Oral Q6H   Chlorhexidine Gluconate Cloth  6 each Topical Daily   docusate sodium  100 mg Oral BID   dofetilide  250 mcg Oral Q12H   feeding supplement  237 mL Oral TID BM   fluticasone furoate-vilanterol  1 puff Inhalation Daily   gabapentin  200 mg Oral QHS   lidocaine  1 patch Transdermal Q24H   magic mouthwash  5 mL Oral QID   magnesium oxide  400 mg Oral BID   methocarbamol  750 mg Oral Q6H   multivitamin with minerals  1 tablet Oral Daily   nystatin   Topical BID   mouth rinse  15 mL Mouth Rinse 4 times per day   pantoprazole  40 mg Oral BID   polyethylene glycol  17 g Oral BID   revefenacin  175 mcg Nebulization Daily   rivaroxaban  20  mg Oral Q supper   spironolactone  25 mg Oral Daily   torsemide  40 mg Oral Daily   Continuous Infusions:  .TPN (CLINIMIX-E) Adult     magnesium sulfate bolus IVPB 4 g (05/21/23 0940)   TPN (CLINIMIX) Adult without lytes 42 mL/hr at 05/20/23 1808   PRN Meds:.albuterol, guaiFENesin-dextromethorphan, HYDROmorphone (DILAUDID) injection, metoCLOPramide (REGLAN) injection, ondansetron (ZOFRAN) IV, oxyCODONE, sodium chloride flush   I have personally reviewed following labs and imaging studies  LABORATORY DATA: CBC: Recent Labs  Lab 05/16/23 0227 05/17/23 0306 05/18/23 0419 05/19/23 0206 05/20/23 0259  WBC 8.2 8.1 9.4 9.1 8.1  HGB 7.5* 7.2* 7.0* 8.1* 8.3*  HCT 24.6* 23.8* 23.0* 26.0* 26.6*  MCV 86.0 86.2 84.9 85.0 83.6  PLT 217 210 215 198 217    Basic Metabolic Panel: Recent Labs  Lab 05/16/23 0630 05/17/23 0306 05/20/23 0259 05/21/23 0313  NA 131* 134* 134* 131*  K 3.6 3.6 3.4* 3.9  CL 97* 101 97* 94*  CO2 23 24 29 29   GLUCOSE 166*  165* 179* 213*  BUN 21 28* 25* 28*  CREATININE 0.63 0.66 0.54 0.54  CALCIUM 8.8* 8.7* 10.3 9.9  MG  --  1.6* 1.7 1.7  PHOS  --  3.9 4.5 3.3    GFR: Estimated Creatinine Clearance: 61.3 mL/min (by C-G formula based on SCr of 0.54 mg/dL).  Liver Function Tests: Recent Labs  Lab 05/16/23 0630 05/17/23 0306 05/20/23 0259  AST 12* 10* 11*  ALT 11 10 8   ALKPHOS 58 49 51  BILITOT 0.6 0.5 0.7  PROT 5.5* 5.2* 5.4*  ALBUMIN 2.4* 2.2* 2.4*   No results for input(s): "LIPASE", "AMYLASE" in the last 168 hours. No results for input(s): "AMMONIA" in the last 168 hours.  Coagulation Profile: No results for input(s): "INR", "PROTIME" in the last 168 hours.  Cardiac Enzymes: No results for input(s): "CKTOTAL", "CKMB", "CKMBINDEX", "TROPONINI" in the last 168 hours.  BNP (last 3 results) No results for input(s): "PROBNP" in the last 8760 hours.  Lipid Profile: No results for input(s): "CHOL", "HDL", "LDLCALC", "TRIG", "CHOLHDL", "LDLDIRECT" in the last 72 hours.     Thyroid Function Tests: No results for input(s): "TSH", "T4TOTAL", "FREET4", "T3FREE", "THYROIDAB" in the last 72 hours.  Anemia Panel: No results for input(s): "VITAMINB12", "FOLATE", "FERRITIN", "TIBC", "IRON", "RETICCTPCT" in the last 72 hours.  Urine analysis:    Component Value Date/Time   COLORURINE AMBER (A) 04/23/2023 0623   APPEARANCEUR CLOUDY (A) 04/23/2023 0623   APPEARANCEUR Hazy 12/25/2012 1653   LABSPEC 1.024 04/23/2023 0623   LABSPEC 1.021 12/25/2012 1653   PHURINE 5.0 04/23/2023 0623   GLUCOSEU NEGATIVE 04/23/2023 0623   GLUCOSEU NEGATIVE 03/17/2023 1003   HGBUR MODERATE (A) 04/23/2023 0623   BILIRUBINUR NEGATIVE 04/23/2023 0623   BILIRUBINUR Negative 12/25/2012 1653   KETONESUR NEGATIVE 04/23/2023 0623   PROTEINUR 30 (A) 04/23/2023 0623   UROBILINOGEN 0.2 03/17/2023 1003   NITRITE NEGATIVE 04/23/2023 0623   LEUKOCYTESUR LARGE (A) 04/23/2023 0623   LEUKOCYTESUR Negative 12/25/2012 1653     Sepsis Labs: Lactic Acid, Venous    Component Value Date/Time   LATICACIDVEN 0.8 04/23/2023 0114    MICROBIOLOGY: No results found for this or any previous visit (from the past 240 hours).   RADIOLOGY STUDIES/RESULTS: DG Abd Portable 1V Result Date: 05/19/2023 CLINICAL DATA:  Abdominal pain EXAM: PORTABLE ABDOMEN - 1 VIEW COMPARISON:  05/16/2023 CT FINDINGS: Scattered large and small bowel gas is noted. Left lower quadrant ostomy is seen.  Contrast is noted within the rectal stump. Multiple surgical drains are seen in place. No free air is seen. IMPRESSION: No acute abnormality noted. Electronically Signed   By: Alcide Clever M.D.   On: 05/19/2023 19:02     LOS: 29 days   Jeoffrey Massed, MD  [facility]    To contact the attending provider between 7A-7P or the covering provider during after hours 7P-7A, please log into the web site [email or web address] and access using universal [place] password for that web site. If you do not have the password, please call the hospital operator.  05/21/2023, 10:55 AM

## 2023-05-21 NOTE — Progress Notes (Signed)
Mobility Specialist Progress Note;   05/21/23 1010  Mobility  Activity Transferred from bed to chair  Level of Assistance Contact guard assist, steadying assist  Assistive Device Other (Comment) (HHA)  Distance Ambulated (ft) 3 ft  Activity Response Tolerated well  Mobility Referral Yes  Mobility visit 1 Mobility  Mobility Specialist Start Time (ACUTE ONLY) 1010  Mobility Specialist Stop Time (ACUTE ONLY) 1020  Mobility Specialist Time Calculation (min) (ACUTE ONLY) 10 min   Pt agreeable to mobility. Required MinG assistance during transfer from bed to chair for safety. VSS throughout and no c/o during session. Pt left in chair with all needs met.   Caesar Bookman Mobility Specialist Please contact via SecureChat or [employer] [phone]

## 2023-05-21 NOTE — Progress Notes (Signed)
PHARMACY - TOTAL PARENTERAL NUTRITION CONSULT NOTE  Indication: Prolonged ileus  Patient Measurements: Height: 5' (152.4 cm) Weight: 94.1 kg (207 lb 7.3 oz) IBW/kg (Calculated) : 45.5 TPN AdjBW (KG): 54 Body mass index is 40.52 kg/m. Weight trends: 79.3kg (11/21) >>93.2 kg 12/11  Assessment:  28 YOF with recurrent sigmoid diverticulitis and associated colovaginal fistula, HFrEF, COPD, Afib with PPM, and gout who was admitted with persistent symptoms with multiple abscesses s/p ex lap with sigmoid colectomy and end left colostomy on 11/19. Patient has been NPO since surgery. Pharmacy consulted to manage TPN.  Weights have been stable in the last year ~182-194 lbs. Patient doesn't eat much at home, only 2 meals a day. For breakfast she might eat grits, an egg and coffee. For dinner she cooks her own food (doesn't like the food at facility) and might include chicken, green beans, carrots and rice. She does drink chocolate Boost regularly and thinks Boost tastes better than Ensure. She does not like vanilla or berry flavored supplements. Unfortunately, we only have Boost in wild berry.  Glucose / Insulin: hx T2DM, A1c 6.2%; no meds PTA - AM glucose 213 x 1 Electrolytes: low Na/CL (stable), K up to 3.9 post (goal >/= 4), Mag remains at 1.7 post 2gm and on standing MagOx (goal >/= 2), CoCa elevated at 11.18 (Ca x Phos = 37, goal < 55), others WNL Renal: SCr < 1, BUN 20s Hepatic: LFTs / tbili / TG WNL, albumin 2.4 Intake / Output; MIVF: no UOP charted on torsemide and spironolactone, JP drain 0mL (s/p Reglan 12/2>>12/8) GI Imaging: 11/21 KUB moderately dilated loops of small bowel, c/w post-operative ileus 11/23 KUB: persistent SBO or ileus, no significant change from prior 11/25 KUB: bowel obstruction or ileus, pleural effusion vs atelectasis 11/25 CTAP: post-op ileus or distal SBO, trace L pleural effusion 11/26 CXR: L > R lower lobe atelectasis 11/29 Abd XR: distention of SB concerning  for obstruction or worsening postop ileus 12/1 CT: mild ileus 12/6 KUB: improved but not normalized ileus-like bowel gas pattern 12/8 CT:  consistent with ileus, mildly improved from the prior  12/11 KUB: no acute abnormality  GI Surgeries / Procedures:  11/19 ex lap with sigmoid colectomy and end left colostomy  Central access: PICC placed 04/30/23 TPN start date: 04/30/23  Nutritional Goals: Clinimix E 8/10 1L at 42 ml/hr with SMOF lipids on MTWThFSa Clinimix E 8/10 2L at 82 ml/hr on Sun with SMOFlipids - Average of 1258 kCals (~80% of goal) and 91g AA per day (100% goal) Previously on D10 fluids for add'l kCal >> stopped on 11/26 given concerns for pleural effusions/atelectasis noted on imaging, weights increased requiring Lasix IV - this has resolved so restarted D10 at 11/29, then changed to sodium bicarb D5 gtt   Electrolytes in Clinimix E 8/10 1L bag: Na 35 mEq, K 30 mEq, Mag 5 mEq, Ca 4.5 mEq, Phos , Acetate 83, Cl 76 mEq  RD Estimated Needs Total Energy Estimated Needs: 1600-1800 kcal Total Protein Estimated Needs: 75-95 gm Total Fluid Estimated Needs: >1.6L  Current Nutrition:  TPN (1/2 TPN since 12/11) Soft diet since 12/10: eating 10% of meals (half of intake at home per patient).  Still with N/V, food choice limits intake. Ensure Enlive TID - 2 charted given yesterday Prosource BID - 2 charted given   Plan:  Clinimix E 8/10 at 42 mL/hr with no SMOF lipids to provide 660 kCal and 80g AA per day Received SMOFlipid daily from 11/22-12/11 -  monitor for EFAD PO multivitamin daily (no MVI or trace elements in TPN) KCL PO Mag sulfate 4gm IV x 1.  MagOx 400mg  PO BID per MD (started 12/10) Monitor TPN labs Mon/Thur and PRN - labs in AM Monitor AM glucose and the need to restart SSI F/U oral intake and ability to wean off of TPN vs increasing back to goal if intake remains poor for >/= 5 days  Paulett Kaufhold D. Laney Potash, PharmD, BCPS, BCCCP 05/21/2023, 9:56 AM

## 2023-05-22 DIAGNOSIS — E440 Moderate protein-calorie malnutrition: Secondary | ICD-10-CM | POA: Diagnosis not present

## 2023-05-22 DIAGNOSIS — E1140 Type 2 diabetes mellitus with diabetic neuropathy, unspecified: Secondary | ICD-10-CM | POA: Diagnosis not present

## 2023-05-22 DIAGNOSIS — K5720 Diverticulitis of large intestine with perforation and abscess without bleeding: Secondary | ICD-10-CM | POA: Diagnosis not present

## 2023-05-22 DIAGNOSIS — E039 Hypothyroidism, unspecified: Secondary | ICD-10-CM | POA: Diagnosis not present

## 2023-05-22 LAB — BASIC METABOLIC PANEL
Anion gap: 6 (ref 5–15)
BUN: 36 mg/dL — ABNORMAL HIGH (ref 8–23)
CO2: 28 mmol/L (ref 22–32)
Calcium: 9.3 mg/dL (ref 8.9–10.3)
Chloride: 95 mmol/L — ABNORMAL LOW (ref 98–111)
Creatinine, Ser: 0.57 mg/dL (ref 0.44–1.00)
GFR, Estimated: >60 mL/min (ref >60)
Glucose, Bld: 193 mg/dL — ABNORMAL HIGH (ref 70–99)
Potassium: 4.1 mmol/L (ref 3.5–5.1)
Sodium: 129 mmol/L — ABNORMAL LOW (ref 135–145)

## 2023-05-22 LAB — GLUCOSE, CAPILLARY: Glucose-Capillary: 195 mg/dL — ABNORMAL HIGH (ref 70–99)

## 2023-05-22 LAB — MAGNESIUM: Magnesium: 2.1 mg/dL (ref 1.7–2.4)

## 2023-05-22 LAB — PHOSPHORUS: Phosphorus: 3.9 mg/dL (ref 2.5–4.6)

## 2023-05-22 MED ORDER — POLYETHYLENE GLYCOL 3350 17 G PO PACK
17.0000 g | PACK | Freq: Three times a day (TID) | ORAL | Status: DC
Start: 2023-05-22 — End: 2023-05-26
  Administered 2023-05-22 – 2023-05-26 (×10): 17 g via ORAL
  Filled 2023-05-22 (×10): qty 1

## 2023-05-22 MED ORDER — INSULIN ASPART 100 UNIT/ML IJ SOLN
0.0000 [IU] | Freq: Every day | INTRAMUSCULAR | Status: DC
  Administered 2023-05-24: 2 [IU] via SUBCUTANEOUS

## 2023-05-22 MED ORDER — NYSTATIN 100000 UNIT/GM EX POWD
Freq: Three times a day (TID) | CUTANEOUS | Status: DC
  Filled 2023-05-22: qty 15

## 2023-05-22 MED ORDER — INSULIN ASPART 100 UNIT/ML IJ SOLN
0.0000 [IU] | Freq: Three times a day (TID) | INTRAMUSCULAR | Status: DC
  Administered 2023-05-23 (×2): 2 [IU] via SUBCUTANEOUS
  Administered 2023-05-23 – 2023-05-24 (×2): 3 [IU] via SUBCUTANEOUS
  Administered 2023-05-24 – 2023-05-25 (×5): 2 [IU] via SUBCUTANEOUS
  Administered 2023-05-27: 1 [IU] via SUBCUTANEOUS

## 2023-05-22 MED ORDER — CLINIMIX/DEXTROSE (8/10) 8 % IV SOLN: INTRAVENOUS | Status: DC

## 2023-05-22 MED ORDER — CLINIMIX E/DEXTROSE (8/10) 8 % IV SOLN
INTRAVENOUS | Status: AC
  Filled 2023-05-22 (×2): qty 1000

## 2023-05-22 NOTE — Progress Notes (Signed)
PROGRESS NOTE        PATIENT DETAILS Name: Kathleen [name] Age: 77 y.o. Sex: female Date of Birth: 01/09/1946 Admit Date: 04/22/2023 Admitting Physician Gery Pray, MD [id number], Bernadene Bell, MD  Brief Summary:  Patient is a 77 y.o.  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  Admitted by [facility]-started on empiric IV antibiotics-underwent preop assessment by cardiology-and then subsequently underwent  exploratory laparotomy with sigmoid colectomy and end ostomy on 11/19.  Postoperative course complicated by ileus-requiring NG tube decompression-and TNA for nutrition.  See below for further details.  Significant events: 11/14>> admit to [facility] 11/19>>exploratory laparotomy with sigmoid colectomy and end ostomy  12/04>> NG removed-clear liquid started  Significant studies: 11/14>> CT abdomen/pelvis: Worsening signal right diverticulitis-localized perforation-5.6 cm perirectal abscess, 6.0 cm left lower quadrant abscess. 11/22>> x-ray abdomen: Consistent with postoperative ileus. 11/25>> CT abdomen/pelvis: No abscess 12/01>> CT abdomen/pelvis: Mild small bowel ileus-no obvious abscess-small fluid collection in the colonic stump  Significant microbiology data: 11/14>> blood culture: No growth 11/14>> COVID/influenza/RSV PCR: Negative  Procedures: 11/19>>exploratory laparotomy with sigmoid colectomy  end ostomy on 11/19  Consults: Cardiology GI General surgery  Subjective: Intermittent abdominal pain continues.  Objective: Vitals: Blood pressure (!) 109/57, pulse 70, temperature 97.9 F (36.6 C), resp. rate 18, height 5' (1.524 m), weight 94.1 kg, SpO2 100%.   Exam: Awake/alert Abdomen: Soft-dressing in place-dark brown stool in ostomy.   Erythema/yeast dermatitis and some of her pannus is today.   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 Zosyn discontinued 12/10 Still has JP drain x 2 in place Some yeast dermatitis in abdominal pannus-nystatin started today-follow. Postop care being managed by general surgery.  Hypokalemia/hypophosphatemia/hypomagnesemia Replete per pharmacy TNA protocol.  Normocytic anemia Secondary to critical illness No evidence of blood loss Transfused 1 additional unit of PRBC on 12/10 Continue to follow CBC periodically.  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  GERD PPI  DM-2 (A1c 6.2 on 11/15) CBGs stable  on SSI  Nutrition Status: Nutrition Problem: Moderate Malnutrition Etiology: chronic illness Signs/Symptoms: mild fat depletion, mild muscle depletion, energy intake < 75% for > 7 days Interventions: Ensure Enlive (each supplement provides 350kcal and 20 grams of protein), Magic cup, MVI  Obesity: Estimated body mass index is 40.52 kg/m as calculated from the following:   Height as of this encounter: 5' (1.524 m).   Weight as of this encounter: 94.1 kg.   Code status:   Code Status: Limited: Do not attempt resuscitation (DNR) -DNR-LIMITED -Do Not Intubate/DNI    DVT Prophylaxis: IV heparinrivaroxaban (XARELTO) tablet 20 mg    Family Communication: None at bedside.   Disposition Plan: Status is: Inpatient Remains inpatient appropriate because: Severity of illness   Planned Discharge Destination:Skilled nursing facility   Diet: Diet Order             DIET SOFT Fluid consistency: Thin  Diet effective now                     Antimicrobial agents: Anti-infectives (From admission, onward)    Start     Dose/Rate Route Frequency Ordered Stop   05/10/23 1000  piperacillin-tazobactam (ZOSYN) IVPB 3.375 g  Status:  Discontinued        3.375 g 12.5 mL/hr over 240 Minutes Intravenous Every 8 hours 05/10/23 0914 05/17/23 1317   05/03/23 1400  piperacillin-tazobactam (ZOSYN) IVPB 3.375 g        3.375 g 12.5 mL/hr over 240 Minutes Intravenous Every 8 hours 05/03/23 0935 05/07/23 0124   04/27/23 1021  sodium chloride 0.9 % with cefoTEtan (CEFOTAN) ADS Med       Note to Pharmacy: Shanda Bumps M: cabinet override      04/27/23 1021 04/27/23 1253   04/26/23 0800  cefoTEtan (CEFOTAN) 2 g in sodium chloride 0.9 % 100 mL IVPB  Status:  Discontinued        2 g 200 mL/hr over 30 Minutes Intravenous To ShortStay Surgical 04/23/23 2111 04/27/23 0800   04/22/23 2300  piperacillin-tazobactam (ZOSYN) IVPB 3.375 g        3.375 g 12.5 mL/hr over 240 Minutes Intravenous Every 8 hours  04/22/23 2256 05/02/23 2359   04/22/23 1900  piperacillin-tazobactam (ZOSYN) IVPB 3.375 g        3.375 g 100 mL/hr over 30 Minutes Intravenous  Once 04/22/23 1852 04/22/23 1954        MEDICATIONS: Scheduled Meds:  (feeding supplement) PROSource Plus  30 mL Oral BID BM   sodium chloride   Intravenous Once   acetaminophen  650 mg Oral Q6H   Chlorhexidine Gluconate Cloth  6 each Topical Daily   docusate sodium  100 mg Oral BID   dofetilide  250 mcg Oral Q12H   feeding supplement  237 mL Oral TID BM   fluticasone furoate-vilanterol  1 puff Inhalation Daily   gabapentin  200 mg Oral QHS   insulin aspart  0-5 Units Subcutaneous QHS   insulin aspart  0-9 Units Subcutaneous TID WC   lidocaine  1 patch Transdermal Q24H   magic mouthwash  5 mL Oral QID   magnesium oxide  400 mg Oral BID   methocarbamol  750 mg Oral Q6H   multivitamin with minerals  1 tablet Oral Daily   nystatin   Topical TID   mouth rinse  15 mL Mouth Rinse 4 times per day   pantoprazole  40 mg Oral BID  polyethylene glycol  17 g Oral TID   revefenacin  175 mcg Nebulization Daily   rivaroxaban  20 mg Oral Q supper   spironolactone  25 mg Oral Daily   torsemide  40 mg Oral Daily   Continuous Infusions:  .TPN (CLINIMIX-E) Adult 42 mL/hr at 05/21/23 1752   .TPN (CLINIMIX-E) Adult     PRN Meds:.albuterol, guaiFENesin-dextromethorphan, HYDROmorphone (DILAUDID) injection, metoCLOPramide (REGLAN) injection, ondansetron (ZOFRAN) IV, oxyCODONE, sodium chloride flush   I have personally reviewed following labs and imaging studies  LABORATORY DATA: CBC: Recent Labs  Lab 05/16/23 0227 05/17/23 0306 05/18/23 0419 05/19/23 0206 05/20/23 0259  WBC 8.2 8.1 9.4 9.1 8.1  HGB 7.5* 7.2* 7.0* 8.1* 8.3*  HCT 24.6* 23.8* 23.0* 26.0* 26.6*  MCV 86.0 86.2 84.9 85.0 83.6  PLT 217 210 215 198 217    Basic Metabolic Panel: Recent Labs  Lab 05/16/23 0630 05/17/23 0306 05/20/23 0259 05/21/23 0313 05/22/23 0449  NA 131*  134* 134* 131* 129*  K 3.6 3.6 3.4* 3.9 4.1  CL 97* 101 97* 94* 95*  CO2 23 24 29 29 28   GLUCOSE 166* 165* 179* 213* 193*  BUN 21 28* 25* 28* 36*  CREATININE 0.63 0.66 0.54 0.54 0.57  CALCIUM 8.8* 8.7* 10.3 9.9 9.3  MG  --  1.6* 1.7 1.7 2.1  PHOS  --  3.9 4.5 3.3 3.9    GFR: Estimated Creatinine Clearance: 61.3 mL/min (by C-G formula based on SCr of 0.57 mg/dL).  Liver Function Tests: Recent Labs  Lab 05/16/23 0630 05/17/23 0306 05/20/23 0259  AST 12* 10* 11*  ALT 11 10 8   ALKPHOS 58 49 51  BILITOT 0.6 0.5 0.7  PROT 5.5* 5.2* 5.4*  ALBUMIN 2.4* 2.2* 2.4*   No results for input(s): "LIPASE", "AMYLASE" in the last 168 hours. No results for input(s): "AMMONIA" in the last 168 hours.  Coagulation Profile: No results for input(s): "INR", "PROTIME" in the last 168 hours.  Cardiac Enzymes: No results for input(s): "CKTOTAL", "CKMB", "CKMBINDEX", "TROPONINI" in the last 168 hours.  BNP (last 3 results) No results for input(s): "PROBNP" in the last 8760 hours.  Lipid Profile: No results for input(s): "CHOL", "HDL", "LDLCALC", "TRIG", "CHOLHDL", "LDLDIRECT" in the last 72 hours.     Thyroid Function Tests: No results for input(s): "TSH", "T4TOTAL", "FREET4", "T3FREE", "THYROIDAB" in the last 72 hours.  Anemia Panel: No results for input(s): "VITAMINB12", "FOLATE", "FERRITIN", "TIBC", "IRON", "RETICCTPCT" in the last 72 hours.  Urine analysis:    Component Value Date/Time   COLORURINE AMBER (A) 04/23/2023 0623   APPEARANCEUR CLOUDY (A) 04/23/2023 0623   APPEARANCEUR Hazy 12/25/2012 1653   LABSPEC 1.024 04/23/2023 0623   LABSPEC 1.021 12/25/2012 1653   PHURINE 5.0 04/23/2023 0623   GLUCOSEU NEGATIVE 04/23/2023 0623   GLUCOSEU NEGATIVE 03/17/2023 1003   HGBUR MODERATE (A) 04/23/2023 0623   BILIRUBINUR NEGATIVE 04/23/2023 0623   BILIRUBINUR Negative 12/25/2012 1653   KETONESUR NEGATIVE 04/23/2023 0623   PROTEINUR 30 (A) 04/23/2023 0623   UROBILINOGEN 0.2  03/17/2023 1003   NITRITE NEGATIVE 04/23/2023 0623   LEUKOCYTESUR LARGE (A) 04/23/2023 0623   LEUKOCYTESUR Negative 12/25/2012 1653    Sepsis Labs: Lactic Acid, Venous    Component Value Date/Time   LATICACIDVEN 0.8 04/23/2023 0114    MICROBIOLOGY: No results found for this or any previous visit (from the past 240 hours).   RADIOLOGY STUDIES/RESULTS: No results found.    LOS: 30 days   Jeoffrey Massed, MD  [facility]  To contact the attending provider between 7A-7P or the covering provider during after hours 7P-7A, please log into the web site [email or web address] and access using universal [place] password for that web site. If you do not have the password, please call the hospital operator.  05/22/2023, 10:40 AM

## 2023-05-22 NOTE — Progress Notes (Signed)
PHARMACY - TOTAL PARENTERAL NUTRITION CONSULT NOTE  Indication: Prolonged ileus  Patient Measurements: Height: 5' (152.4 cm) Weight: 94.1 kg (207 lb 7.3 oz) IBW/kg (Calculated) : 45.5 TPN AdjBW (KG): 54 Body mass index is 40.52 kg/m. Weight trends: 79.3kg (11/21) >>93.2 kg 12/11  Assessment:  16 YOF with recurrent sigmoid diverticulitis and associated colovaginal fistula, HFrEF, COPD, Afib with PPM, and gout who was admitted with persistent symptoms with multiple abscesses s/p ex lap with sigmoid colectomy and end left colostomy on 11/19. Patient has been NPO since surgery. Pharmacy consulted to manage TPN.  Weights have been stable in the last year ~182-194 lbs. Patient doesn't eat much at home, only 2 meals a day. For breakfast she might eat grits, an egg and coffee. For dinner she cooks her own food (doesn't like the food at facility) and might include chicken, green beans, carrots and rice. She does drink chocolate Boost regularly and thinks Boost tastes better than Ensure. She does not like vanilla or berry flavored supplements. Unfortunately, we only have Boost in wild berry.  Glucose / Insulin: hx T2DM, A1c 6.2%; no meds PTA - AM glucose elevated x 2 Electrolytes: low Na/CL (stable), K up to 4.1 post (goal >/= 4), Mag up to 2.1 post 4gm and on standing MagOx (goal >/= 2), CoCa down to 10.58 (Ca x Phos = 41, goal < 55), others WNL Renal: SCr < 1, BUN 30s Hepatic: LFTs / tbili / TG WNL, albumin 2.4 Intake / Output; MIVF: no UOP charted on torsemide and spironolactone, JP drain 5mL (s/p Reglan 12/2>>12/8) GI Imaging: 11/21 KUB moderately dilated loops of small bowel, c/w post-operative ileus 11/23 KUB: persistent SBO or ileus, no significant change from prior 11/25 KUB: bowel obstruction or ileus, pleural effusion vs atelectasis 11/25 CTAP: post-op ileus or distal SBO, trace L pleural effusion 11/26 CXR: L > R lower lobe atelectasis 11/29 Abd XR: distention of SB concerning for  obstruction or worsening postop ileus 12/1 CT: mild ileus 12/6 KUB: improved but not normalized ileus-like bowel gas pattern 12/8 CT:  consistent with ileus, mildly improved from the prior  12/11 KUB: no acute abnormality  GI Surgeries / Procedures:  11/19 ex lap with sigmoid colectomy and end left colostomy  Central access: PICC placed 04/30/23 TPN start date: 04/30/23  Nutritional Goals: Clinimix E 8/10 1L at 42 ml/hr with SMOF lipids on MTWThFSa Clinimix E 8/10 2L at 82 ml/hr on Sun with SMOFlipids - Average of 1258 kCals (~80% of goal) and 91g AA per day (100% goal) Previously on D10 fluids for add'l kCal >> stopped on 11/26 given concerns for pleural effusions/atelectasis noted on imaging, weights increased requiring Lasix IV - this has resolved so restarted D10 at 11/29, then changed to sodium bicarb D5 gtt   Electrolytes in Clinimix E 8/10 1L bag: Na 35 mEq, K 30 mEq, Mag 5 mEq, Ca 4.5 mEq, Phos , Acetate 83, Cl 76 mEq  RD Estimated Needs Total Energy Estimated Needs: 1600-1800 kcal Total Protein Estimated Needs: 75-95 gm Total Fluid Estimated Needs: >1.6L  Current Nutrition:  TPN (1/2 TPN since 12/11) Soft diet since 12/10: ate 20% of breakfast and lunch and didn't eat dinner b/c of the food provided  (half of intake at home per patient).  N/V improving, food choice limits intake.  Eating better per patient. Ensure Enlive TID - 1 charted given yesterday Prosource BID - 2 charted given   Plan:  Clinimix E 8/10 at 42 mL/hr with no  SMOF lipids to provide 660 kCal and 80g AA per day, meeting 41% of kCal and 100% of protein needs Received SMOFlipid daily from 11/22-12/11 - monitor for EFAD PO multivitamin daily (no MVI or trace elements in TPN) Initiate sensitive SSI TIDACHS MagOx 400mg  PO BID per MD (started 12/10) Monitor TPN labs Mon/Thur and PRN - labs on Mon F/U oral intake and ability to wean off of TPN vs increasing back to goal early next week if intake  remains poor   Kathleen [name] D. Laney Potash, PharmD, BCPS, BCCCP 05/22/2023, 7:37 AM

## 2023-05-22 NOTE — Progress Notes (Signed)
Patient ID: Kathleen [name], female   DOB: 08-11-1945, 77 y.o.   MRN: [id number] [facility] Surgery Progress Note  25 Days Post-Op  Subjective: CC-  Reports eating 20% of meals and 2 protein shakes yesterday. Denies nausea or vomiting. Reports feeling bloated.   Objective: Vital signs in last 24 hours: Temp:  [97.7 F (36.5 C)-98.1 F (36.7 C)] 97.9 F (36.6 C) (12/14 0818) Pulse Rate:  [69-73] 70 (12/14 0818) Resp:  [16-20] 18 (12/14 0313) BP: (109-127)/(53-85) 109/57 (12/14 0818) SpO2:  [97 %-100 %] 100 % (12/14 0818) Weight:  [94.1 kg] 94.1 kg (12/14 0246) Last BM Date : 05/21/23  Intake/Output from previous day: 12/13 0701 - 12/14 0700 In: 981.8 [I.V.:981.8] Out: 5 [Drains:5] Intake/Output this shift: No intake/output data recorded.  PE: Gen:  Alert, NAD Card:  RRR Pulm:  rate and effort normal on supplemental O2 via [place] Abd: soft, obese, mild distention, minimal tenderness around her incision, dressing removed and incision stable, there is yeast dermatitis under her pannus and in her inguinal creases. Stoma viable, small amount of hard, formed stool in pouch.  JP#1 and #2 scant serous fluid   Lab Results:  Recent Labs    05/20/23 0259  WBC 8.1  HGB 8.3*  HCT 26.6*  PLT 217   BMET Recent Labs    05/21/23 0313 05/22/23 0449  NA 131* 129*  K 3.9 4.1  CL 94* 95*  CO2 29 28  GLUCOSE 213* 193*  BUN 28* 36*  CREATININE 0.54 0.57  CALCIUM 9.9 9.3   PT/INR No results for input(s): "LABPROT", "INR" in the last 72 hours. CMP     Component Value Date/Time   NA 129 (L) 05/22/2023 0449   NA 130 (L) 12/28/2012 0748   K 4.1 05/22/2023 0449   K 4.6 12/28/2012 0748   CL 95 (L) 05/22/2023 0449   CL 95 (L) 12/28/2012 0748   CO2 28 05/22/2023 0449   CO2 23 12/28/2012 0748   GLUCOSE 193 (H) 05/22/2023 0449   GLUCOSE 391 (H) 12/28/2012 0748   BUN 36 (H) 05/22/2023 0449   BUN 32 (H) 12/28/2012 0748   CREATININE 0.57 05/22/2023 0449   CREATININE 0.86  01/27/2023 1137   CALCIUM 9.3 05/22/2023 0449   CALCIUM 14.3 (HH) 06/26/2019 1618   PROT 5.4 (L) 05/20/2023 0259   ALBUMIN 2.4 (L) 05/20/2023 0259   AST 11 (L) 05/20/2023 0259   ALT 8 05/20/2023 0259   ALKPHOS 51 05/20/2023 0259   BILITOT 0.7 05/20/2023 0259   GFRNONAA >60 05/22/2023 0449   GFRNONAA 43 (L) 12/28/2012 0748   GFRAA 58 (L) 06/28/2019 0427   GFRAA 50 (L) 12/28/2012 0748   Lipase     Component Value Date/Time   LIPASE 30 04/22/2023 1840       Studies/Results: No results found.   Anti-infectives: Anti-infectives (From admission, onward)    Start     Dose/Rate Route Frequency Ordered Stop   05/10/23 1000  piperacillin-tazobactam (ZOSYN) IVPB 3.375 g  Status:  Discontinued        3.375 g 12.5 mL/hr over 240 Minutes Intravenous Every 8 hours 05/10/23 0914 05/17/23 1317   05/03/23 1400  piperacillin-tazobactam (ZOSYN) IVPB 3.375 g        3.375 g 12.5 mL/hr over 240 Minutes Intravenous Every 8 hours 05/03/23 0935 05/07/23 0124   04/27/23 1021  sodium chloride 0.9 % with cefoTEtan (CEFOTAN) ADS Med       Note to Pharmacy: [name]: cabinet  override      04/27/23 1021 04/27/23 1253   04/26/23 0800  cefoTEtan (CEFOTAN) 2 g in sodium chloride 0.9 % 100 mL IVPB  Status:  Discontinued        2 g 200 mL/hr over 30 Minutes Intravenous To ShortStay Surgical 04/23/23 2111 04/27/23 0800   04/22/23 2300  piperacillin-tazobactam (ZOSYN) IVPB 3.375 g        3.375 g 12.5 mL/hr over 240 Minutes Intravenous Every 8 hours 04/22/23 2256 05/02/23 2359   04/22/23 1900  piperacillin-tazobactam (ZOSYN) IVPB 3.375 g        3.375 g 100 mL/hr over 30 Minutes Intravenous  Once 04/22/23 1852 04/22/23 1954        Assessment/Plan POD#24 S/p Exploratory laparotomy, sigmoid colectomy, colostomy 11/19/24Dwain [name] - CT 12/8 fairly unchanged with mild ileus and small 3.8x2.2cm fluid collection near colonic stump.  JP drain x2 previously purulent, they are serous today and  minimal output. Continue drains. - WBC WNL, afebrile. Off antibiotics - Continues to have some abdominal pain and distention. Ostomy output is thick, increased miralax. KUB 12/12 reassuring. - Reglan PRN for nausea. - Continue 1/2 TPN again today - WOC following for new ostomy. Stoma with some mucocutaneous separation, monitor. Will need ostomy clinic referral at discharge  - vac unable to hold seal and was removed, continue BID wet to dry dressing changes; I ordered interdry for skin folds and increased nystatin powder to TID. Consider systemic antifungal. - oob, pulm toilet   ID zosyn 11/14>>11/29, 12/2>>12/9 VTE - xarelto for afib  FEN - 1/2 TPN, soft, Ensure    LOS: 30 days    Kathleen [name], [facility] 05/22/2023, 9:51 AM Please see Amion for pager number during day hours 7:00am-4:30pm

## 2023-05-22 NOTE — Progress Notes (Signed)
Mobility Specialist Progress Note;   05/22/23 1400  Mobility  Activity Ambulated with assistance in hallway;Transferred to/from [facility]  Level of Assistance Contact guard assist, steadying assist  Assistive Device Four wheel walker  Distance Ambulated (ft) 75 ft  Activity Response Tolerated well  Mobility Referral Yes  Mobility visit 1 Mobility  Mobility Specialist Start Time (ACUTE ONLY) 1400  Mobility Specialist Stop Time (ACUTE ONLY) 1430  Mobility Specialist Time Calculation (min) (ACUTE ONLY) 30 min   Pt agreeable to mobility. On 4LO2 upon arrival. Pt a bit tearful at BOS because of pain. Requested assistance to [facility] at BOS, void successful. Required MinG assistance during ambulation for safety. Ambulated on 4LO2, VSS. Took 1x standing rest break d/t fatigue.Pt returned back to bed with all needs met, on 4LO2.   Caesar Bookman Mobility Specialist Please contact via SecureChat or [employer] [phone]

## 2023-05-23 ENCOUNTER — Inpatient Hospital Stay (HOSPITAL_COMMUNITY): Payer: Medicare Other

## 2023-05-23 DIAGNOSIS — K5720 Diverticulitis of large intestine with perforation and abscess without bleeding: Secondary | ICD-10-CM | POA: Diagnosis not present

## 2023-05-23 LAB — BASIC METABOLIC PANEL
Anion gap: 14 (ref 5–15)
BUN: 37 mg/dL — ABNORMAL HIGH (ref 8–23)
CO2: 27 mmol/L (ref 22–32)
Calcium: 10.1 mg/dL (ref 8.9–10.3)
Chloride: 91 mmol/L — ABNORMAL LOW (ref 98–111)
Creatinine, Ser: 0.62 mg/dL (ref 0.44–1.00)
GFR, Estimated: >60 mL/min (ref >60)
Glucose, Bld: 206 mg/dL — ABNORMAL HIGH (ref 70–99)
Potassium: 3.8 mmol/L (ref 3.5–5.1)
Sodium: 132 mmol/L — ABNORMAL LOW (ref 135–145)

## 2023-05-23 LAB — CBC
HCT: 29.4 % — ABNORMAL LOW (ref 36.0–46.0)
Hemoglobin: 9.2 g/dL — ABNORMAL LOW (ref 12.0–15.0)
MCH: 25.9 pg — ABNORMAL LOW (ref 26.0–34.0)
MCHC: 31.3 g/dL (ref 30.0–36.0)
MCV: 82.8 fL (ref 80.0–100.0)
Platelets: 272 10*3/uL (ref 150–400)
RBC: 3.55 MIL/uL — ABNORMAL LOW (ref 3.87–5.11)
RDW: 16.6 % — ABNORMAL HIGH (ref 11.5–15.5)
WBC: 9.9 10*3/uL (ref 4.0–10.5)
nRBC: 0.2 % (ref 0.0–0.2)

## 2023-05-23 LAB — GLUCOSE, CAPILLARY
Glucose-Capillary: 185 mg/dL — ABNORMAL HIGH (ref 70–99)
Glucose-Capillary: 203 mg/dL — ABNORMAL HIGH (ref 70–99)
Glucose-Capillary: 175 mg/dL — ABNORMAL HIGH (ref 70–99)
Glucose-Capillary: 180 mg/dL — ABNORMAL HIGH (ref 70–99)

## 2023-05-23 MED ORDER — POTASSIUM CHLORIDE CRYS ER 20 MEQ PO TBCR: 40.0000 meq | EXTENDED_RELEASE_TABLET | Freq: Once | ORAL | Status: DC

## 2023-05-23 MED ORDER — CLINIMIX E/DEXTROSE (8/10) 8 % IV SOLN
INTRAVENOUS | Status: AC
Start: 2023-05-23 — End: 2023-05-24
  Filled 2023-05-23 (×2): qty 1000

## 2023-05-23 MED ORDER — POTASSIUM CHLORIDE 10 MEQ/100ML IV SOLN
10.0000 meq | INTRAVENOUS | Status: AC
Start: 2023-05-23 — End: 2023-05-23
  Administered 2023-05-23 (×4): 10 meq via INTRAVENOUS
  Filled 2023-05-23 (×4): qty 100

## 2023-05-23 NOTE — Progress Notes (Signed)
Patient endorsed abdominal pain several times at night. Tried to give oxycodone but is persistently refusing as she claimed that it is not working. Educated patient about pain management. Gave Dilaudid IV instead.

## 2023-05-23 NOTE — Progress Notes (Addendum)
Mobility Specialist Progress Note;   05/23/23 1325  Mobility  Activity Ambulated with assistance in hallway;Transferred to/from [facility]  Level of Assistance Contact guard assist, steadying assist  Assistive Device Four wheel walker  Distance Ambulated (ft) 75 ft  Activity Response Tolerated fair  Mobility Referral Yes  Mobility visit 1 Mobility  Mobility Specialist Start Time (ACUTE ONLY) 1325  Mobility Specialist Stop Time (ACUTE ONLY) 1350  Mobility Specialist Time Calculation (min) (ACUTE ONLY) 25 min   Pt agreeable to mobility. Requested assistance to [facility] at BOS, void successful. Required MinG assistance during session for safety. Ambulated on 4LO2, VSS throughout. C/o abd pain throughout. Pt returned back to bed with all needs met, on 3LO2. Son in room.   Caesar Bookman Mobility Specialist Please contact via SecureChat or [employer] [phone]

## 2023-05-23 NOTE — Progress Notes (Signed)
PHARMACY - TOTAL PARENTERAL NUTRITION CONSULT NOTE  Indication: Prolonged ileus  Patient Measurements: Height: 5' (152.4 cm) Weight: 94.1 kg (207 lb 7.3 oz) IBW/kg (Calculated) : 45.5 TPN AdjBW (KG): 54 Body mass index is 40.52 kg/m. 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 Patient has been NPO since surgery. Pharmacy consulted to manage TPN.  Weights have been stable in the last year ~182-194 lbs. Patient doesn't eat much at home, only 2 meals a day. For breakfast she might eat grits, an egg and coffee. For dinner she cooks her own food (doesn't like the food at facility) and might include chicken, green beans, carrots and rice. She does drink chocolate Boost regularly and thinks Boost tastes better than Ensure. She does not like vanilla or berry flavored supplements. Unfortunately, we only have Boost in wild berry.  Glucose / Insulin: hx T2DM, A1c 6.2%; no meds PTA - AM glucose elevated x2 and SSI reinitiated 12/14 >> no usage 12/14 as patient refused Electrolytes: low Na/CL (stable), K 3.8 (goal >/= 4), CoCa back up to 11.38 (Ca x Phos = 44, goal < 55), others WNL Renal: SCr < 1, BUN 30s Hepatic: LFTs / tbili / TG WNL, albumin 2.4 Intake / Output; MIVF: no UOP charted on torsemide and spironolactone, JP drain 5mL (s/p Reglan 12/2>>12/8, Miralax increased to TID) GI Imaging: 11/21 KUB moderately dilated loops of small bowel, c/w post-operative ileus 11/23 KUB: persistent SBO or ileus, no significant change from prior 11/25 KUB: bowel obstruction or ileus, pleural effusion vs atelectasis 11/25 CTAP: post-op ileus or distal SBO, trace L pleural effusion 11/26 CXR: L > R lower lobe atelectasis 11/29 Abd XR: distention of SB  concerning for obstruction or worsening postop ileus 12/1 CT: mild ileus 12/6 KUB: improved but not normalized ileus-like bowel gas pattern 12/8 CT:  consistent with ileus, mildly improved from the prior  12/11 KUB: no acute abnormality  12/15 CT:  pending GI Surgeries / Procedures:  11/19 ex lap with sigmoid colectomy and end left colostomy  Central access: PICC placed 04/30/23 TPN start date: 04/30/23  Nutritional Goals: Clinimix E 8/10 1L at 42 ml/hr with SMOF lipids on MTWThFSa Clinimix E 8/10 2L at 82 ml/hr on Sun with SMOFlipids - Average of 1258 kCals (~80% of goal) and 91g AA per day (100% goal) Previously on D10 fluids for add'l kCal >> stopped on 11/26 given concerns for pleural effusions/atelectasis noted on imaging, weights increased requiring Lasix IV - this has resolved so restarted D10 at 11/29, then changed to sodium bicarb D5 gtt   Electrolytes in Clinimix E 8/10 1L bag: Na 35 mEq, K 30 mEq, Mag 5 mEq, Ca 4.5 mEq, Phos , Acetate 83, Cl 76 mEq  RD Estimated Needs Total Energy Estimated Needs: 1600-1800 kcal Total Protein Estimated Needs: 75-95 gm Total Fluid Estimated Needs: >1.6L  Current Nutrition:  TPN (1/2 TPN since 12/11) Ensure Enlive TID - 2 charted given yesterday Prosource BID - 2 charted given yesterday Soft diet since 12/10: ate 25% of meals, improving.  Nausea and pain overnight and TRH change to NPO  Plan:  Clinimix E 8/10 at 42 mL/hr with no SMOF lipids to provide 660 kCal and 80g AA per day, meeting 41% of kCal and 100% of  protein needs Received SMOFlipid daily from 11/22-12/11 - monitor for EFAD PO multivitamin daily (no MVI or trace elements in TPN) Continue sensitive SSI TIDACHS MagOx 400mg  PO BID per MD (started 12/10) KCL PO x 1 Monitor TPN labs Mon/Thur (might need Clinimix without lytes 1-2 times per week pending Ca-Phos product) F/U oral intake and ability to wean off of TPN - hold off on increasing back to goal per  Surgery  Kathleen [name], PharmD, BCPS, BCCCP 05/23/2023, 12:00 PM

## 2023-05-23 NOTE — Progress Notes (Signed)
PROGRESS NOTE        PATIENT DETAILS Name: Kathleen [name] Age: 77 y.o. Sex: female Date of Birth: Nov 06, 1945 Admit Date: 04/22/2023 Admitting Physician Gery Pray, MD WJX:BJYNW, Bernadene Bell, MD  Brief Summary:  Patient is a 77 y.o.  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  Admitted by TRH-started on empiric IV antibiotics-underwent preop assessment by cardiology-and then subsequently underwent  exploratory laparotomy with sigmoid colectomy and end ostomy on 11/19.  Postoperative course complicated by ileus-requiring NG tube decompression-and TNA for nutrition.  See below for further details.  Significant events: 11/14>> admit to [facility] 11/19>>exploratory laparotomy with sigmoid colectomy and end ostomy  12/04>> NG removed-clear liquid started  Significant studies: 11/14>> CT abdomen/pelvis: Worsening signal right diverticulitis-localized perforation-5.6 cm perirectal abscess, 6.0 cm left lower quadrant abscess. 11/22>> x-ray abdomen: Consistent with postoperative ileus. 11/25>> CT abdomen/pelvis: No abscess 12/01>> CT abdomen/pelvis: Mild small bowel ileus-no obvious abscess-small fluid collection in the colonic stump  Significant microbiology data: 11/14>> blood culture: No growth 11/14>> COVID/influenza/RSV PCR: Negative  Procedures: 11/19>>exploratory laparotomy with sigmoid colectomy  end ostomy on 11/19  Consults: Cardiology GI General surgery  Subjective: Patient in bed, no headache or chest pain but increased abdominal pain throughout last night with nausea, she says she is feeling worse today than she has felt in the last several days.  Objective: Vitals: Blood pressure 126/61, pulse 76, temperature 98 F (36.7 C), temperature  source Oral, resp. rate 12, height 5' (1.524 m), weight 94.1 kg, SpO2 99%.   Exam:  Awake Alert, No new F.N deficits, Normal affect [place].AT,PERRAL Supple Neck, No JVD,   Symmetrical Chest wall movement, Good air movement bilaterally, CTAB RRR,No Gallops, Rubs or new Murmurs,  +ve B.Sounds, Abd Soft but distended, mild generalized tenderness, colostomy bag with some dark stool in it, pannus cellulitis Trace edema  Assessment/Plan:  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 with localized perforation and pelvic abscess-s/p ex lap with colectomy/ostomy on 11/19-complicated by postoperative ileus requiring NG tube decompression  Overall had improved-NG tube discontinued 12/4, was on soft diet, still on TNA as oral intake is still not reliable, finished her antibiotics which was Zosyn on 05/18/2023.  Surgery following, JP drain and colostomy in place, worsening nausea morning of 05/23/2023, n.p.o. except medications, CT scan stat, general surgery informed.    Hypokalemia/hypophosphatemia/hypomagnesemia Replete per pharmacy TNA protocol.  Normocytic anemia Secondary to critical illness No evidence of blood loss Transfused 1 additional unit of PRBC on 12/10 Continue to follow CBC periodically.  Persistent atrial fibrillation Telemetry monitoring Tikosyn orally-attempt to keep K> 4 Initially on IV heparin-has been switched to Xarelto  History of AV nodal ablation-s/p PPM ([employer]) Telemetry monitoring  Chronic HFrEF (EF 35-40% on 4/9 by TTE) Volume status reasonably stable but some leg edema today Lasix/Aldactone   Pannus cellulitis.  Nystatin powder and monitor.    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  GERD PPI  DM-2 (A1c 6.2 on 11/15) CBGs stable on  SSI  CBG (last 3)  Recent Labs    05/22/23 2039  GLUCAP 195*    Nutrition  Status: Nutrition Problem: Moderate Malnutrition Etiology: chronic illness Signs/Symptoms: mild fat depletion, mild muscle depletion, energy intake < 75% for > 7 days Interventions: Ensure Enlive (each supplement provides 350kcal and 20 grams of protein), Magic cup, MVI  Obesity: Estimated body mass index is 40.52 kg/m as calculated from the following:   Height as of this encounter: 5' (1.524 m).   Weight as of this encounter: 94.1 kg.   Code status:   Code Status: Limited: Do not attempt resuscitation (DNR) -DNR-LIMITED -Do Not Intubate/DNI    DVT Prophylaxis: IV heparinrivaroxaban (XARELTO) tablet 20 mg    Family Communication: None at bedside.   Disposition Plan: Status is: Inpatient Remains inpatient appropriate because: Severity of illness   Planned Discharge Destination:Skilled nursing facility   Diet: Diet Order             Diet NPO time specified Except for: Sips with Meds  Diet effective now                   MEDICATIONS: Scheduled Meds:  (feeding supplement) PROSource Plus  30 mL Oral BID BM   sodium chloride   Intravenous Once   acetaminophen  650 mg Oral Q6H   Chlorhexidine Gluconate Cloth  6 each Topical Daily   docusate sodium  100 mg Oral BID   dofetilide  250 mcg Oral Q12H   feeding supplement  237 mL Oral TID BM   fluticasone furoate-vilanterol  1 puff Inhalation Daily   gabapentin  200 mg Oral QHS   insulin aspart  0-5 Units Subcutaneous QHS   insulin aspart  0-9 Units Subcutaneous TID WC   lidocaine  1 patch Transdermal Q24H   magic mouthwash  5 mL Oral QID   magnesium oxide  400 mg Oral BID   methocarbamol  750 mg Oral Q6H   multivitamin with minerals  1 tablet Oral Daily   nystatin   Topical TID   mouth rinse  15 mL Mouth Rinse 4 times per day   pantoprazole  40 mg Oral BID   polyethylene glycol  17 g Oral TID   revefenacin  175 mcg Nebulization Daily    rivaroxaban  20 mg Oral Q supper   spironolactone  25 mg Oral Daily   torsemide  40 mg Oral Daily   Continuous Infusions:  .TPN (CLINIMIX-E) Adult 42 mL/hr at 05/22/23 1740   PRN Meds:.albuterol, guaiFENesin-dextromethorphan, HYDROmorphone (DILAUDID) injection, metoCLOPramide (REGLAN) injection, ondansetron (ZOFRAN) IV, oxyCODONE, sodium chloride flush   I have personally reviewed following labs and imaging studies  LABORATORY DATA:  Recent Labs  Lab 05/17/23 0306 05/18/23 0419 05/19/23 0206 05/20/23 0259 05/23/23 0426  WBC 8.1 9.4 9.1 8.1 9.9  HGB 7.2* 7.0* 8.1* 8.3* 9.2*  HCT 23.8* 23.0* 26.0* 26.6* 29.4*  PLT 210 215 198 217 272  MCV 86.2 84.9 85.0 83.6 82.8  MCH 26.1 25.8* 26.5 26.1 25.9*  MCHC 30.3 30.4 31.2 31.2 31.3  RDW 17.9* 17.7* 17.0* 17.0* 16.6*    Recent Labs  Lab 05/17/23 0306 05/20/23 0259 05/21/23 0313 05/22/23 0449 05/23/23 0426  NA 134* 134* 131* 129* 132*  K 3.6 3.4* 3.9 4.1 3.8  CL 101 97* 94* 95* 91*  CO2 24 29 29 28 27   ANIONGAP 9 8 8 6 14   GLUCOSE 165* 179* 213* 193* 206*  BUN 28* 25* 28* 36* 37*  CREATININE 0.66 0.54 0.54 0.57 0.62  AST 10* 11*  --   --   --   ALT 10  8  --   --   --   ALKPHOS 49 51  --   --   --   BILITOT 0.5 0.7  --   --   --   ALBUMIN 2.2* 2.4*  --   --   --   MG 1.6* 1.7 1.7 2.1  --   CALCIUM 8.7* 10.3 9.9 9.3 10.1    Lab Results  Component Value Date   CHOL 174 08/07/2021   HDL 35.00 (L) 08/07/2021   LDLCALC 99 08/07/2021   TRIG 135 05/17/2023   CHOLHDL 5 08/07/2021      Recent Labs  Lab 05/17/23 0306 05/20/23 0259 05/21/23 0313 05/22/23 0449 05/23/23 0426  MG 1.6* 1.7 1.7 2.1  --   CALCIUM 8.7* 10.3 9.9 9.3 10.1    RADIOLOGY STUDIES/RESULTS: No results found.    LOS: 31 days   Signature  -    Susa Raring M.D on 05/23/2023 at 8:18 AM   -  To page go to [email or web address]

## 2023-05-23 NOTE — Progress Notes (Signed)
Patient ID: Kathleen [name], female   DOB: 04-21-1946, 77 y.o.   MRN: [id number] [facility] Surgery Progress Note  26 Days Post-Op  Subjective: CC-  Reports eating 20% of meals and 2 protein shakes yesterday. Denies nausea or vomiting. Ostomy working well  Objective: Vital signs in last 24 hours: Temp:  [97.5 F (36.4 C)-98.1 F (36.7 C)] 97.5 F (36.4 C) (12/15 0829) Pulse Rate:  [69-88] 71 (12/15 0829) Resp:  [12-20] 16 (12/15 0935) BP: (106-150)/(48-69) 150/67 (12/15 0829) SpO2:  [98 %-100 %] 99 % (12/15 0812) Last BM Date : 05/22/23  Intake/Output from previous day: 12/14 0701 - 12/15 0700 In: 913.9 [P.O.:400; I.V.:513.9] Out: 220 [Drains:20; Stool:200] Intake/Output this shift: No intake/output data recorded.  PE: Gen:  Alert, NAD Card:  RRR Pulm:  rate and effort normal on supplemental O2 via Adeline Abd: soft, obese, not significantly distended, minimal tenderness around her incision, incision stable, there is yeast dermatitis under her pannus and in her inguinal creases. Stoma viable, fair amount of unformed soft stool in pouch.  JP#1 and #2 scant serous fluid   Lab Results:  Recent Labs    05/23/23 0426  WBC 9.9  HGB 9.2*  HCT 29.4*  PLT 272   BMET Recent Labs    05/22/23 0449 05/23/23 0426  NA 129* 132*  K 4.1 3.8  CL 95* 91*  CO2 28 27  GLUCOSE 193* 206*  BUN 36* 37*  CREATININE 0.57 0.62  CALCIUM 9.3 10.1   PT/INR No results for input(s): "LABPROT", "INR" in the last 72 hours. CMP     Component Value Date/Time   NA 132 (L) 05/23/2023 0426   NA 130 (L) 12/28/2012 0748   K 3.8 05/23/2023 0426   K 4.6 12/28/2012 0748   CL 91 (L) 05/23/2023 0426   CL 95 (L) 12/28/2012 0748   CO2 27 05/23/2023 0426   CO2 23 12/28/2012 0748   GLUCOSE 206 (H) 05/23/2023 0426   GLUCOSE 391 (H) 12/28/2012 0748   BUN 37 (H) 05/23/2023 0426   BUN 32 (H) 12/28/2012 0748   CREATININE 0.62 05/23/2023 0426   CREATININE 0.86 01/27/2023 1137   CALCIUM 10.1  05/23/2023 0426   CALCIUM 14.3 (HH) 06/26/2019 1618   PROT 5.4 (L) 05/20/2023 0259   ALBUMIN 2.4 (L) 05/20/2023 0259   AST 11 (L) 05/20/2023 0259   ALT 8 05/20/2023 0259   ALKPHOS 51 05/20/2023 0259   BILITOT 0.7 05/20/2023 0259   GFRNONAA >60 05/23/2023 0426   GFRNONAA 43 (L) 12/28/2012 0748   GFRAA 58 (L) 06/28/2019 0427   GFRAA 50 (L) 12/28/2012 0748   Lipase     Component Value Date/Time   LIPASE 30 04/22/2023 1840       Studies/Results: No results found.   Anti-infectives: Anti-infectives (From admission, onward)    Start     Dose/Rate Route Frequency Ordered Stop   05/10/23 1000  piperacillin-tazobactam (ZOSYN) IVPB 3.375 g  Status:  Discontinued        3.375 g 12.5 mL/hr over 240 Minutes Intravenous Every 8 hours 05/10/23 0914 05/17/23 1317   05/03/23 1400  piperacillin-tazobactam (ZOSYN) IVPB 3.375 g        3.375 g 12.5 mL/hr over 240 Minutes Intravenous Every 8 hours 05/03/23 0935 05/07/23 0124   04/27/23 1021  sodium chloride 0.9 % with cefoTEtan (CEFOTAN) ADS Med       Note to Pharmacy: Shanda Bumps M: cabinet override      04/27/23 1021 04/27/23  1253   04/26/23 0800  cefoTEtan (CEFOTAN) 2 g in sodium chloride 0.9 % 100 mL IVPB  Status:  Discontinued        2 g 200 mL/hr over 30 Minutes Intravenous To ShortStay Surgical 04/23/23 2111 04/27/23 0800   04/22/23 2300  piperacillin-tazobactam (ZOSYN) IVPB 3.375 g        3.375 g 12.5 mL/hr over 240 Minutes Intravenous Every 8 hours 04/22/23 2256 05/02/23 2359   04/22/23 1900  piperacillin-tazobactam (ZOSYN) IVPB 3.375 g        3.375 g 100 mL/hr over 30 Minutes Intravenous  Once 04/22/23 1852 04/22/23 1954        Assessment/Plan POD#24 S/p Exploratory laparotomy, sigmoid colectomy, colostomy 11/19/24Dwain Sarna - CT 12/8 fairly unchanged with mild ileus and small 3.8x2.2cm fluid collection near colonic stump.  JP drain x2 previously purulent, they are serous today and minimal output. Continue drains. -  WBC WNL, afebrile. Off antibiotics - Continues to have some abdominal pain and distention. Ostomy output is thick, cont miralax. KUB 12/12 reassuring. - Reglan PRN for nausea. - Continue 1/2 TPN again today - WOC following for new ostomy. Stoma with some mucocutaneous separation, monitor. Will need ostomy clinic referral at discharge  - vac unable to hold seal and was removed, continue BID wet to dry dressing changes; I ordered interdry for skin folds and increased nystatin powder to TID. Consider systemic antifungal. - oob, pulm toilet   ID zosyn 11/14>>11/29, 12/2>>12/9 VTE - xarelto for afib  FEN - 1/2 TPN, soft, Ensure    LOS: 31 days   Marin Olp, MD [facility] Surgery, A [facility]

## 2023-05-23 NOTE — Plan of Care (Signed)
  Problem: Education: Goal: Ability to describe self-care measures that may prevent or decrease complications (Diabetes Survival Skills Education) will improve Outcome: Progressing   Problem: Coping: Goal: Ability to adjust to condition or change in health will improve Outcome: Progressing   Problem: Fluid Volume: Goal: Ability to maintain a balanced intake and output will improve Outcome: Progressing   Problem: Health Behavior/Discharge Planning: Goal: Ability to identify and utilize available resources and services will improve Outcome: Progressing   Problem: Metabolic: Goal: Ability to maintain appropriate glucose levels will improve Outcome: Progressing   Problem: Nutritional: Goal: Maintenance of adequate nutrition will improve Outcome: Progressing   Problem: Skin Integrity: Goal: Risk for impaired skin integrity will decrease Outcome: Progressing   Problem: Tissue Perfusion: Goal: Adequacy of tissue perfusion will improve Outcome: Progressing   Problem: Education: Goal: Ability to describe self-care measures that may prevent or decrease complications (Diabetes Survival Skills Education) will improve Outcome: Progressing   Problem: Coping: Goal: Ability to adjust to condition or change in health will improve Outcome: Progressing   Problem: Fluid Volume: Goal: Ability to maintain a balanced intake and output will improve Outcome: Progressing   Problem: Health Behavior/Discharge Planning: Goal: Ability to identify and utilize available resources and services will improve Outcome: Progressing   Problem: Metabolic: Goal: Ability to maintain appropriate glucose levels will improve Outcome: Progressing   Problem: Nutritional: Goal: Maintenance of adequate nutrition will improve Outcome: Progressing   Problem: Skin Integrity: Goal: Risk for impaired skin integrity will decrease Outcome: Progressing   Problem: Tissue Perfusion: Goal: Adequacy of tissue  perfusion will improve Outcome: Progressing

## 2023-05-24 ENCOUNTER — Inpatient Hospital Stay (HOSPITAL_COMMUNITY): Payer: Medicare Other

## 2023-05-24 ENCOUNTER — Encounter (HOSPITAL_COMMUNITY): Payer: Self-pay | Admitting: Family Medicine

## 2023-05-24 ENCOUNTER — Encounter (HOSPITAL_COMMUNITY)
Admission: EM | Disposition: A | Discharge status: Nursing Home (Includes ICF) | Payer: Self-pay | Source: Home / Self Care | Attending: Internal Medicine

## 2023-05-24 ENCOUNTER — Encounter: Payer: Self-pay | Admitting: Licensed Clinical Social Worker

## 2023-05-24 DIAGNOSIS — N823 Fistula of vagina to large intestine: Secondary | ICD-10-CM

## 2023-05-24 DIAGNOSIS — B3781 Candidal esophagitis: Secondary | ICD-10-CM

## 2023-05-24 DIAGNOSIS — R933 Abnormal findings on diagnostic imaging of other parts of digestive tract: Secondary | ICD-10-CM

## 2023-05-24 DIAGNOSIS — K229 Disease of esophagus, unspecified: Secondary | ICD-10-CM | POA: Diagnosis not present

## 2023-05-24 DIAGNOSIS — K5720 Diverticulitis of large intestine with perforation and abscess without bleeding: Secondary | ICD-10-CM | POA: Diagnosis not present

## 2023-05-24 DIAGNOSIS — R110 Nausea: Secondary | ICD-10-CM | POA: Diagnosis not present

## 2023-05-24 DIAGNOSIS — Z9049 Acquired absence of other specified parts of digestive tract: Secondary | ICD-10-CM

## 2023-05-24 HISTORY — PX: ESOPHAGOGASTRODUODENOSCOPY (EGD) WITH PROPOFOL: SHX5813

## 2023-05-24 LAB — COMPREHENSIVE METABOLIC PANEL
ALT: 9 U/L (ref 0–44)
AST: 12 U/L — ABNORMAL LOW (ref 15–41)
Albumin: 2.7 g/dL — ABNORMAL LOW (ref 3.5–5.0)
Alkaline Phosphatase: 57 U/L (ref 38–126)
Anion gap: 9 (ref 5–15)
BUN: 37 mg/dL — ABNORMAL HIGH (ref 8–23)
CO2: 28 mmol/L (ref 22–32)
Calcium: 10 mg/dL (ref 8.9–10.3)
Chloride: 93 mmol/L — ABNORMAL LOW (ref 98–111)
Creatinine, Ser: 0.65 mg/dL (ref 0.44–1.00)
GFR, Estimated: >60 mL/min (ref >60)
Glucose, Bld: 214 mg/dL — ABNORMAL HIGH (ref 70–99)
Potassium: 3.8 mmol/L (ref 3.5–5.1)
Sodium: 130 mmol/L — ABNORMAL LOW (ref 135–145)
Total Bilirubin: 0.5 mg/dL (ref <1.2)
Total Protein: 5.8 g/dL — ABNORMAL LOW (ref 6.5–8.1)

## 2023-05-24 LAB — OSMOLALITY: Osmolality: 297 mosm/kg — ABNORMAL HIGH (ref 275–295)

## 2023-05-24 LAB — CBC WITH DIFFERENTIAL/PLATELET
Abs Immature Granulocytes: 0.32 10*3/uL — ABNORMAL HIGH (ref 0.00–0.07)
Basophils Absolute: 0.1 10*3/uL (ref 0.0–0.1)
Basophils Relative: 1 %
Eosinophils Absolute: 0.8 10*3/uL — ABNORMAL HIGH (ref 0.0–0.5)
Eosinophils Relative: 7 %
HCT: 30.2 % — ABNORMAL LOW (ref 36.0–46.0)
Hemoglobin: 9.3 g/dL — ABNORMAL LOW (ref 12.0–15.0)
Immature Granulocytes: 3 %
Lymphocytes Relative: 10 %
Lymphs Abs: 1.1 10*3/uL (ref 0.7–4.0)
MCH: 25.6 pg — ABNORMAL LOW (ref 26.0–34.0)
MCHC: 30.8 g/dL (ref 30.0–36.0)
MCV: 83.2 fL (ref 80.0–100.0)
Monocytes Absolute: 0.9 10*3/uL (ref 0.1–1.0)
Monocytes Relative: 8 %
Neutro Abs: 7.6 10*3/uL (ref 1.7–7.7)
Neutrophils Relative %: 71 %
Platelets: 296 10*3/uL (ref 150–400)
RBC: 3.63 MIL/uL — ABNORMAL LOW (ref 3.87–5.11)
RDW: 16.4 % — ABNORMAL HIGH (ref 11.5–15.5)
WBC: 10.7 10*3/uL — ABNORMAL HIGH (ref 4.0–10.5)
nRBC: 0.2 % (ref 0.0–0.2)

## 2023-05-24 LAB — PHOSPHORUS: Phosphorus: 4.1 mg/dL (ref 2.5–4.6)

## 2023-05-24 LAB — CREATININE, URINE, RANDOM: Creatinine, Urine: 36 mg/dL

## 2023-05-24 LAB — GLUCOSE, CAPILLARY
Glucose-Capillary: 174 mg/dL — ABNORMAL HIGH (ref 70–99)
Glucose-Capillary: 168 mg/dL — ABNORMAL HIGH (ref 70–99)
Glucose-Capillary: 231 mg/dL — ABNORMAL HIGH (ref 70–99)
Glucose-Capillary: 231 mg/dL — ABNORMAL HIGH (ref 70–99)

## 2023-05-24 LAB — OSMOLALITY, URINE: Osmolality, Ur: 442 mosm/kg (ref 300–900)

## 2023-05-24 LAB — URIC ACID: Uric Acid, Serum: 8.6 mg/dL — ABNORMAL HIGH (ref 2.5–7.1)

## 2023-05-24 LAB — TRIGLYCERIDES: Triglycerides: 145 mg/dL (ref <150)

## 2023-05-24 LAB — MAGNESIUM: Magnesium: 1.9 mg/dL (ref 1.7–2.4)

## 2023-05-24 LAB — SODIUM, URINE, RANDOM: Sodium, Ur: <10 mmol/L

## 2023-05-24 SURGERY — ESOPHAGOGASTRODUODENOSCOPY (EGD) WITH PROPOFOL
Anesthesia: Monitor Anesthesia Care

## 2023-05-24 MED ORDER — POTASSIUM CHLORIDE 10 MEQ/50ML IV SOLN
10.0000 meq | INTRAVENOUS | Status: AC
  Filled 2023-05-24 (×4): qty 50

## 2023-05-24 MED ORDER — FLUCONAZOLE IN SODIUM CHLORIDE 400-0.9 MG/200ML-% IV SOLN
400.0000 mg | INTRAVENOUS | Status: AC
  Administered 2023-05-24: 400 mg via INTRAVENOUS
  Filled 2023-05-24: qty 200

## 2023-05-24 MED ORDER — PROPOFOL 10 MG/ML IV BOLUS
INTRAVENOUS | Status: DC | PRN
  Administered 2023-05-24: 30 mg via INTRAVENOUS

## 2023-05-24 MED ORDER — LIDOCAINE 2% (20 MG/ML) 5 ML SYRINGE
INTRAMUSCULAR | Status: DC | PRN
  Administered 2023-05-24: 50 mg via INTRAVENOUS

## 2023-05-24 MED ORDER — POTASSIUM CHLORIDE 10 MEQ/50ML IV SOLN
10.0000 meq | INTRAVENOUS | Status: AC
  Administered 2023-05-24 (×4): 10 meq via INTRAVENOUS
  Filled 2023-05-24 (×5): qty 50

## 2023-05-24 MED ORDER — CLINIMIX E/DEXTROSE (8/10) 8 % IV SOLN
INTRAVENOUS | Status: AC
Start: 2023-05-24 — End: 2023-05-25
  Filled 2023-05-24 (×2): qty 1000

## 2023-05-24 MED ORDER — DEXAMETHASONE SODIUM PHOSPHATE 10 MG/ML IJ SOLN
INTRAMUSCULAR | Status: DC | PRN
  Administered 2023-05-24: 5 mg via INTRAVENOUS

## 2023-05-24 MED ORDER — PROPOFOL 500 MG/50ML IV EMUL
INTRAVENOUS | Status: DC | PRN
  Administered 2023-05-24: 100 ug/kg/min via INTRAVENOUS

## 2023-05-24 MED ORDER — SODIUM CHLORIDE 0.9 % IV SOLN: INTRAVENOUS | Status: DC | PRN

## 2023-05-24 MED ORDER — FLUCONAZOLE 200 MG PO TABS
200.0000 mg | ORAL_TABLET | Freq: Every day | ORAL | Status: DC
  Administered 2023-05-25 – 2023-05-27 (×3): 200 mg via ORAL
  Filled 2023-05-24 (×3): qty 1

## 2023-05-24 MED ORDER — MAGNESIUM SULFATE 2 GM/50ML IV SOLN
2.0000 g | Freq: Once | INTRAVENOUS | Status: AC
  Administered 2023-05-24: 2 g via INTRAVENOUS
  Filled 2023-05-24: qty 50

## 2023-05-24 MED ORDER — ONDANSETRON HCL 4 MG/2ML IJ SOLN
INTRAMUSCULAR | Status: DC | PRN
  Administered 2023-05-24: 4 mg via INTRAVENOUS

## 2023-05-24 SURGICAL SUPPLY — 14 items

## 2023-05-24 NOTE — Progress Notes (Signed)
Physical Therapy Treatment Patient Details Name: Kathleen [name] MRN: [id number] DOB: 1946/04/18 Today's Date: 05/24/2023   History of Present Illness 77 y.o. 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 S/p exploratory laparotomy with sigmoid colectomy and end left colostomy by Dr. Dwain Sarna on 04/27/2023.  PMHx: colovaginal fistula associated with diverticulitis, obesity, OSA , BOOP, tracheobronchomalacia, chronic respiratory failure on 4 to 5 L oxygen at baseline, persistent A-fib S/P pacemaker implant 2017, chronic systolic heart failure, recent admission from 04/09/2023 to 04/13/2023.    PT Comments  Pt received in supine and agreeable to session. Pt continues to report abdominal pain and is instructed in logroll technique for comfort. Pt able to tolerate increased gait distance this session without seated rest break. Pt reports increased dizziness at end of gait session. Pt continues to benefit from PT services to progress toward functional mobility goals.    If plan is discharge home, recommend the following: A lot of help with walking and/or transfers;A lot of help with bathing/dressing/bathroom;Assistance with cooking/housework;Assist for transportation   Can travel by private vehicle     No  Equipment Recommendations  None recommended by PT    Recommendations for Other Services       Precautions / Restrictions Precautions Precautions: Fall Precaution Comments: multiple drains, colostomy Restrictions Weight Bearing Restrictions Per Provider Order: No     Mobility  Bed Mobility Overal bed mobility: Needs Assistance Bed Mobility: Sit to Supine, Supine to Sit     Supine to sit: Contact guard, HOB elevated, Used rails Sit to supine: Min assist, Used rails   General bed mobility comments: Min A for BLE elevation to EOB and cues for logroll technique for decreased abdominal pain    Transfers Overall transfer level: Needs  assistance Equipment used: 1 person hand held assist   Sit to Stand: Min assist           General transfer comment: STS from EOB with min A via HHA for power up    Ambulation/Gait Ambulation/Gait assistance: Contact guard assist, Supervision Gait Distance (Feet): 175 Feet Assistive device: Rollator (4 wheels) Gait Pattern/deviations: Step-through pattern, Trunk flexed, Decreased stride length Gait velocity: decreased     General Gait Details: slow steady gait with cues for upright posture. CGA progressing to supervision for safety       Balance Overall balance assessment: Needs assistance Sitting-balance support: No upper extremity supported, Feet supported Sitting balance-Leahy Scale: Good Sitting balance - Comments: sitting EOB   Standing balance support: During functional activity, Bilateral upper extremity supported, Reliant on assistive device for balance Standing balance-Leahy Scale: Poor Standing balance comment: with rollator support                            Cognition Arousal: Alert Behavior During Therapy: WFL for tasks assessed/performed Overall Cognitive Status: Within Functional Limits for tasks assessed                                          Exercises      General Comments General comments (skin integrity, edema, etc.): Pt reporting mild dizziness during ambulation, but no seated rest break needed. VSS on 4L throughout session      Pertinent Vitals/Pain Pain Assessment Pain Assessment: Faces Faces Pain Scale: Hurts little more Pain Location: abdomen Pain Descriptors / Indicators: Grimacing, Guarding, Aching  Pain Intervention(s): Monitored during session, Repositioned     PT Goals (current goals can now be found in the care plan section) Acute Rehab PT Goals Patient Stated Goal: Reduce pain PT Goal Formulation: With patient Time For Goal Achievement: 05/12/23 Progress towards PT goals: Progressing toward  goals    Frequency    Min 1X/week       AM-PAC PT "6 Clicks" Mobility   Outcome Measure  Help needed turning from your back to your side while in a flat bed without using bedrails?: None Help needed moving from lying on your back to sitting on the side of a flat bed without using bedrails?: A Little Help needed moving to and from a bed to a chair (including a wheelchair)?: A Little Help needed standing up from a chair using your arms (e.g., wheelchair or bedside chair)?: A Little Help needed to walk in hospital room?: A Little Help needed climbing 3-5 steps with a railing? : A Lot 6 Click Score: 18    End of Session Equipment Utilized During Treatment: Gait belt;Oxygen Activity Tolerance: Patient tolerated treatment well;Patient limited by fatigue Patient left: in bed;with call bell/phone within reach;with bed alarm set Nurse Communication: Mobility status PT Visit Diagnosis: Unsteadiness on feet (R26.81);Muscle weakness (generalized) (M62.81);Difficulty in walking, not elsewhere classified (R26.2);Pain     Time: 1455-1529 PT Time Calculation (min) (ACUTE ONLY): 34 min  Charges:    $Gait Training: 8-22 mins $Therapeutic Activity: 8-22 mins PT General Charges $$ ACUTE PT VISIT: 1 Visit                     Johny Shock, PTA Acute Rehabilitation Services Secure Chat Preferred  [phone]    Johny Shock 05/24/2023, 3:42 PM

## 2023-05-24 NOTE — Progress Notes (Signed)
PHARMACY - TOTAL PARENTERAL NUTRITION CONSULT NOTE  Indication: Prolonged ileus  Patient Measurements: Height: 5' (152.4 cm) Weight: 94.1 kg (207 lb 7.3 oz) IBW/kg (Calculated) : 45.5 TPN AdjBW (KG): 54 Body mass index is 40.52 kg/m. 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 Patient has been NPO since surgery. Pharmacy consulted to manage TPN.  Weights have been stable in the last year ~182-194 lbs. Patient doesn't eat much at home, only 2 meals a day. For breakfast she might eat grits, an egg and coffee. For dinner she cooks her own food (doesn't like the food at facility) and might include chicken, green beans, carrots and rice. She does drink chocolate Boost regularly and thinks Boost tastes better than Ensure. She does not like vanilla or berry flavored supplements. Unfortunately, we only have Boost in wild berry.  Glucose / Insulin: hx T2DM, A1c 6.2%; no meds PTA - CBG's 185 to 203. SSI reinitiated 12/14 . 7 units administered in the past 24 hours Electrolytes: low Na/CL (stable), K 3.8 (goal >/= 4), Mag 1.9 (goal >/= 2) CoCa back up to 11.04 (Ca x Phos = 45, goal < 55), others WNL Renal: SCr < 1, BUN 30s Hepatic: AST/ALT12/9 / tbili 0.5 / TG 145, albumin 2.7 Intake / Output; MIVF: no UOP charted (3 unmeasured) on torsemide and spironolactone since 12/5, JP drain not charted (Reglan restarted 12/12), Miralax increased to TID 12/14) GI Imaging: 11/21 KUB moderately dilated loops of small bowel, c/w post-operative ileus 11/23 KUB: persistent SBO or ileus, no significant change from prior 11/25 KUB: bowel obstruction or ileus, pleural effusion vs atelectasis 11/25 CTAP: post-op ileus or distal SBO, trace L pleural  effusion 11/26 CXR: L > R lower lobe atelectasis 11/29 Abd XR: distention of SB concerning for obstruction or worsening postop ileus 12/1 CT: mild ileus 12/6 KUB: improved but not normalized ileus-like bowel gas pattern 12/8 CT:  consistent with ileus, mildly improved from the prior  12/11 KUB: no acute abnormality  12/15 CT:  pending GI Surgeries / Procedures:  11/19 ex lap with sigmoid colectomy and end left colostomy  Central access: PICC placed 04/30/23 TPN start date: 04/30/23  Nutritional Goals: Clinimix E 8/10 1L at 42 ml/hr with SMOF lipids on MTWThFSa Clinimix E 8/10 2L at 82 ml/hr on Sun with SMOFlipids - Average of 1258 kCals (~80% of goal) and 91g AA per day (100% goal) Previously on D10 fluids for add'l kCal >> stopped on 11/26 given concerns for pleural effusions/atelectasis noted on imaging, weights increased requiring Lasix IV - this has resolved so restarted D10 at 11/29, then changed to sodium bicarb D5 gtt   Electrolytes in Clinimix E 8/10 1L bag: Na 35 mEq, K 30 mEq, Mag 5 mEq, Ca 4.5 mEq, Phos , Acetate 83, Cl 76 mEq  RD Estimated Needs Total Energy Estimated Needs: 1600-1800 kcal Total Protein Estimated Needs: 75-95 gm Total Fluid Estimated Needs: >1.6L  Current Nutrition:  TPN (1/2 TPN since 12/11) Ensure Enlive TID - 2 charted given yesterday Prosource BID - 2 charted given yesterday Soft diet since 12/10: ate 25% of meals, improving.  Nausea and pain overnight and TRH change to NPO  Plan:  Clinimix E 8/10 at 42 mL/hr with no SMOF lipids to provide 660  kCal and 80g AA per day, meeting 41% of kCal and 100% of protein needs Received SMOFlipid daily from 11/22-12/11 - monitor for EFAD PO multivitamin daily (no MVI or trace elements in TPN) Continue sensitive SSI TIDACHS MagOx 400mg  PO BID per MD (started 12/10) Mag sulfate 2 grams iv x1 KCL 10 meq iv x4 Monitor TPN labs Mon/Thur (might need Clinimix without lytes 1-2 times per week pending  Ca-Phos product) F/U oral intake and ability to wean off of TPN - hold off on increasing back to goal per Surgery  Greta Doom BS, PharmD, BCPS Clinical Pharmacist 05/24/2023 7:27 AM  Contact: [phone] after 3 PM  "Be curious, not judgmental..." -Debbora Dus

## 2023-05-24 NOTE — Consult Note (Signed)
WOC Nurse ostomy Follow up note Stoma type/location: Stoma is red and viable, 1 1/2 inches, slightly above skin level.  Stoma separation 5`clock with no tunneling anymore, 80 ml stools.   Reviewed orientation ostomy care: - Change the barrier each 3 to 5 days. - Empty the pouch when 1/3 full. - Use water and soap to clean the periostomy skin. - Be careful to not contaminated the surgical wound when take off the barrier, do separately the dressing. -Cut respecting the size and shape of the ostomy.  - Apply the barrier on the skin, check in if is attached to the skin correctly, surrounding the stoma. - Attach the bag, as a ziplock, do not need to press the abd. Open and close the pouch if is needed.   Obs: her son watched part of the orientation and he is want to help, she open and close the pouch without help. We discuss about her practice open and close during the week and empty the bag.   Applied pouch as follows: Barrier ring and 2 piece convex pouching system.  Use Supplies: Apply Aquacel on the stoma separation. Use Barrier ring [id number] Pouch [id number] Barrier [id number].     Thank-you,  Denyse Amass BSN, RN, [employer], WOC  (Pager: [phone])

## 2023-05-24 NOTE — Progress Notes (Signed)
Patient returns from endo at this time 

## 2023-05-24 NOTE — Plan of Care (Signed)
  Problem: Education: Goal: Ability to describe self-care measures that may prevent or decrease complications (Diabetes Survival Skills Education) will improve Outcome: Progressing   Problem: Coping: Goal: Ability to adjust to condition or change in health will improve Outcome: Progressing   Problem: Health Behavior/Discharge Planning: Goal: Ability to identify and utilize available resources and services will improve Outcome: Progressing   Problem: Metabolic: Goal: Ability to maintain appropriate glucose levels will improve Outcome: Progressing   Problem: Nutritional: Goal: Maintenance of adequate nutrition will improve Outcome: Progressing   Problem: Skin Integrity: Goal: Risk for impaired skin integrity will decrease Outcome: Progressing   

## 2023-05-24 NOTE — Consult Note (Signed)
Consultation Note   Referring Provider:  [facility] PCP: [name], MD Primary Gastroenterologist: Corliss Parish, MD        Reason for Consultation: foreign body in esophagus on CT scan  DOA: 04/22/2023         Hospital Day: 65   ASSESSMENT    Brief Narrative:  77 y.o. 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 fib (status post prior AVN ablation and PPM on anticoagulation), hypertension, gout, CRI, iron deficiency, family history of pancreas cancer (mother), colon polyps , and complicated diverticulitis She has had a prolonged admission for diverticulitis complicated by perforation  Sigmoid diverticulitis with colovaginal fistula with localized perforation / abscess s/p sigmoid colectomy and colostomy on 11/19. Post-op course complicated by prolonged ileus requiring NGT and TNA  Persistent nausea without vomiting.  CT scan yesterday didn't show ileus. Nausea possibly secondary to opioids  Foreign body in esophagus >> CT scan yesterday showing an ovoid 1.1 cm hyperdense structure in the lower thoracic Esophagus  A-Fib, on Xarelto. 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  The risks and benefits of EGD with possible biopsies were discussed with the patient who agrees to proceed.    HPI   Patient admitted 11/14 with perforated sigmoid diverticulitis with abscess. She underwent lap exploratory laparotomy with sigmoid colectomy and colostomy on 11/19. Post-op course complicated by prolonged ileus requiring NGT and  TNA. No ileus on CT scan yesterday but did show a foreign body in esophagus.  She has been tolerating sips of clears.  We saw he late October with pill dysphagia. She didn't want to pursue barium swallow because she wasn't having problems swallowing food. EGD wasn't pursued given sedation risk related to chronic respiratory failure  Labs and Imaging: Recent Labs    05/23/23 0426 05/24/23 0322  WBC 9.9 10.7*  HGB 9.2* 9.3*  HCT 29.4* 30.2*  PLT 272 296   Recent Labs    05/22/23 0449 05/23/23 0426 05/24/23 0322  NA 129* 132* 130*  K 4.1 3.8 3.8  CL 95* 91* 93*  CO2 28 27 28   GLUCOSE 193* 206* 214*  BUN 36* 37* 37*  CREATININE 0.57 0.62 0.65  CALCIUM 9.3 10.1 10.0   Recent Labs    05/24/23 0322  PROT 5.8*  ALBUMIN 2.7*  AST 12*  ALT 9  ALKPHOS 57  BILITOT 0.5   No results for input(s): "HEPBSAG", "HCVAB", "HEPAIGM", "HEPBIGM"  in the last 72 hours. No results for input(s): "LABPROT", "INR" in the last 72 hours.    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    Concussion 10/04/2021   COPD (chronic obstructive pulmonary disease) (HCC)    Diverticulitis    Gout    Hypertension    Longstanding persistent atrial fibrillation (HCC)    on Xarelto   Nonischemic cardiomyopathy (HCC)    Obesity    Pacemaker    Spontaneous  pneumothorax 2013    Past Surgical History:  Procedure Laterality Date   ABDOMINAL HYSTERECTOMY     APPENDECTOMY     APPLICATION OF WOUND VAC  04/27/2023   Procedure: APPLICATION OF WOUND VAC;  Surgeon: Emelia Loron, MD;  Location: [facility] OR;  Service: General;;   ATRIAL FIBRILLATION ABLATION  07/20/2013   by Dr Christin Fudge   AV nodal ablation  11/01/2013   by Dr Christin Fudge, repeated by Dr Wilford Grist   BREAST BIOPSY Bilateral 1997   negative   CARDIAC CATHETERIZATION     CHOLECYSTECTOMY     COLON RESECTION SIGMOID N/A 04/27/2023   Procedure: COLON RESECTION SIGMOID WITH COLOSTOMY;  Surgeon: Emelia Loron, MD;  Location: [facility] OR;  Service: General;  Laterality: N/A;   HERNIA REPAIR     LAPAROTOMY N/A 04/27/2023   Procedure: EXPLORATORY LAPAROTOMY;  Surgeon: Emelia Loron, MD;  Location: [facility] OR;  Service: General;  Laterality: N/A;   PACEMAKER INSERTION  06/2017   MDT Viva CRT-P implanted by Dr Christin Fudge after AV nodal ablation,  LV lead could not be placed   PACEMAKER INSERTION  05/2020   with lead bundle   RIGHT/LEFT HEART CATH AND CORONARY ANGIOGRAPHY N/A 03/20/2022   Procedure: RIGHT/LEFT HEART CATH AND CORONARY ANGIOGRAPHY;  Surgeon: [place]az[place], Peter M, MD;  Location: [facility] INVASIVE CV LAB;  Service: Cardiovascular;  Laterality: N/A;    Family History  Problem Relation Age of Onset   Breast cancer Mother 61       3 different times   Pancreatic cancer Mother    Emphysema Father    Healthy Brother    Breast cancer Cousin    Valvular heart disease Son    Obesity Daughter    Colon cancer Neg Hx    Esophageal cancer Neg Hx    Stomach cancer Neg Hx    Inflammatory bowel disease Neg Hx    Liver disease Neg Hx    Rectal cancer Neg Hx     Prior to Admission medications   Medication Sig Start Date End Date Taking? Authorizing Provider  acetaminophen (TYLENOL) 500 MG tablet Take 1,000 mg by mouth as needed.   Yes [provider]  amoxicillin-clavulanate (AUGMENTIN) 875-125  MG tablet Take 1 tablet by mouth every 12 (twelve) hours. 04/13/23  Yes Danford, Earl Lites, MD  Cholecalciferol (VITAMIN D3) 50 MCG (2000 UT) TABS Take 2,000 Units by mouth at bedtime.   Yes [provider]  cyanocobalamin (VITAMIN B12) 1000 MCG/ML injection Inject 1 mL (1,000 mcg total) into the muscle every 30 (thirty) days. 05/05/22  Yes [name], MD  docusate sodium (COLACE) 100 MG capsule Take 100 mg by mouth daily.   Yes [provider]  dofetilide (TIKOSYN) 250 MCG capsule Take 1 capsule (250 mcg total) by mouth in the morning  and at bedtime. 12/01/22 12/01/23 Yes Fenton, Clint R, PA  febuxostat (ULORIC) 40 MG tablet TAKE 1 TABLET(40 MG) BY MOUTH DAILY Patient taking differently: Take 40 mg by mouth at bedtime. 04/05/23  Yes Rice, Jamesetta Orleans, MD  fluticasone furoate-vilanterol (BREO ELLIPTA) 100-25 MCG/ACT AEPB INHALE 1 PUFF INTO THE LUNGS DAILY 03/23/23  Yes Oretha Milch, MD  gabapentin (NEURONTIN) 100 MG capsule Take 2 capsules (200 mg total) by mouth at bedtime. 11/03/22 06/01/23 Yes Camara, Amalia Hailey, MD  levalbuterol (XOPENEX) 0.63 MG/3ML nebulizer solution Take 3 mLs (0.63 mg total) by nebulization every 6 (six) hours as needed for wheezing or shortness of breath. DX J44.89 J96.21 01/20/23  Yes Oretha Milch, MD  Magnesium 500 MG TABS Take 500 mg by mouth 2 (two) times daily.   Yes [provider]  nystatin (MYCOSTATIN) 100000 UNIT/ML suspension Take 5 mLs (500,000 Units total) by mouth 4 (four) times daily. 04/04/23  Yes Almon Hercules, MD  OXYGEN Inhale 4 L into the lungs continuous. Use with resmed ventilator   Yes [provider]  pantoprazole (PROTONIX) 40 MG tablet Take 1 tablet (40 mg total) by mouth daily. 04/05/23  Yes Gonfa, Boyce Medici, MD  Plecanatide (TRULANCE) 3 MG TABS Take 1 tablet (3 mg total) by mouth daily. Patient taking differently: Take 3 mg by mouth 3 (three) times a week. 04/06/23  Yes Mansouraty, Netty Starring., MD  potassium  chloride (KLOR-CON) 10 MEQ tablet Take 40 mEq by mouth daily.   Yes [provider]  revefenacin (YUPELRI) 175 MCG/3ML nebulizer solution Take 175 mcg by nebulization daily.   Yes [provider]  rivaroxaban (XARELTO) 20 MG TABS tablet Take 1 tablet (20 mg total) by mouth daily with supper. 02/01/23  Yes Bensimhon, [name], MD  spironolactone (ALDACTONE) 25 MG tablet Take 1 tablet (25 mg total) by mouth 2 (two) times daily. 04/13/23  Yes Danford, Earl Lites, MD  torsemide (DEMADEX) 20 MG tablet Take 2 tablets (40 mg total) by mouth daily. 04/14/23  Yes Danford, Earl Lites, MD  Zinc Oxide (DESITIN MAXIMUM STRENGTH) 40 % PSTE Apply 1 Application topically 4 (four) times daily. Mix with aquaphor and apply to inflamed area   Yes [provider]    Current Facility-Administered Medications  Medication Dose Route Frequency Provider Last Rate Last Admin   (feeding supplement) PROSource Plus liquid 30 mL  30 mL Oral BID BM Leander Rams, RPH   30 mL at 05/23/23 6962   .TPN (CLINIMIX-E) Adult   Intravenous Continuous TPN Gerilyn Nestle, RPH 42 mL/hr at 05/23/23 1735 New Bag at 05/23/23 1735   .TPN (CLINIMIX-E) Adult   Intravenous Continuous TPN Reome, Earle J, RPH       0.9 %  sodium chloride infusion (Manually program via Guardrails IV Fluids)   Intravenous Once Maretta Bees, MD       acetaminophen (TYLENOL) tablet 650 mg  650 mg Oral Q6H Meuth, Brooke A, PA-C   650 mg at 05/23/23 1631   albuterol (PROVENTIL) (2.5 MG/3ML) 0.083% nebulizer solution 2.5 mg  2.5 mg Nebulization Q2H PRN Barnetta Chapel, PA-C   2.5 mg at 05/21/23 0139   Chlorhexidine Gluconate Cloth 2 % PADS 6 each  6 each Topical Daily Glade Lloyd, MD   6 each at 05/23/23 0930   docusate sodium (COLACE) capsule 100 mg  100 mg Oral BID Meuth, Brooke A, PA-C   100 mg at 05/23/23 2205   dofetilide (TIKOSYN) capsule 250 mcg  250 mcg Oral Q12H Barnetta Chapel, PA-C   250 mcg at 05/23/23 2203   feeding  supplement (ENSURE ENLIVE / ENSURE PLUS) liquid 237 mL  237 mL Oral TID BM Meuth, Brooke A, PA-C   237 mL at 05/23/23 2203   fluticasone furoate-vilanterol (BREO ELLIPTA) 100-25 MCG/ACT 1 puff  1 puff Inhalation Daily Barnetta Chapel, PA-C   1 puff at 05/24/23 0813   gabapentin (NEURONTIN) capsule 200 mg  200 mg Oral QHS Maretta Bees, MD   200 mg at 05/23/23 2205   guaiFENesin-dextromethorphan (ROBITUSSIN DM) 100-10 MG/5ML syrup 5 mL  5 mL Oral Q4H PRN Maretta Bees, MD   5 mL at 05/23/23 1636   HYDROmorphone (DILAUDID) injection 0.5-1 mg  0.5-1 mg Intravenous Q3H PRN Meuth, Brooke A, PA-C   1 mg at 05/24/23 0849   insulin aspart (novoLOG) injection 0-5 Units  0-5 Units Subcutaneous QHS Dang, Thuy D, RPH       insulin aspart (novoLOG) injection 0-9 Units  0-9 Units Subcutaneous TID WC Phillips Climes D, RPH   2 Units at 05/23/23 1633   lidocaine (LIDODERM) 5 % 1 patch  1 patch Transdermal Q24H Meuth, Brooke A, PA-C   1 patch at 05/23/23 5284   magic mouthwash  5 mL Oral QID Elgergawy, Leana Roe, MD   5 mL at 05/23/23 0929   magnesium oxide (MAG-OX) tablet 400 mg  400 mg Oral BID Maretta Bees, MD   400 mg at 05/23/23 2204   magnesium sulfate IVPB 2 g 50 mL  2 g Intravenous Once Reome, Earle J, RPH 50 mL/hr at 05/24/23 0839 2 g at 05/24/23 0839   methocarbamol (ROBAXIN) tablet 750 mg  750 mg Oral Q6H Meuth, Brooke A, PA-C   750 mg at 05/24/23 0259   metoCLOPramide (REGLAN) injection 5 mg  5 mg Intravenous Q6H PRN Meuth, Brooke A, PA-C   5 mg at 05/24/23 0252   multivitamin with minerals tablet 1 tablet  1 tablet Oral Daily Leander Rams, [facility]   1 tablet at 05/23/23 1324   nystatin (MYCOSTATIN/NYSTOP) topical powder   Topical TID Adam Phenix, PA-C   Given at 05/23/23 2205   ondansetron [facility]) injection 4 mg  4 mg Intravenous Q6H PRN Meuth, Brooke A, PA-C   4 mg at 05/24/23 4010   Oral care mouth rinse  15 mL Mouth Rinse 4 times per day Glade Lloyd, MD   15 mL at 05/23/23 2206    oxyCODONE (Oxy IR/ROXICODONE) immediate release tablet 5-10 mg  5-10 mg Oral Q4H PRN Meuth, Brooke A, PA-C   10 mg at 05/23/23 1631   pantoprazole (PROTONIX) EC tablet 40 mg  40 mg Oral BID Leander Rams, RPH   40 mg at 05/23/23 2205   polyethylene glycol (MIRALAX / GLYCOLAX) packet 17 g  17 g Oral TID Adam Phenix, PA-C   17 g at 05/23/23 2203   potassium chloride 10 mEq in 50 mL *CENTRAL LINE* IVPB  10 mEq Intravenous Q1 Hr x 4 Reome, Earle J, RPH       revefenacin (YUPELRI) nebulizer solution 175 mcg  175 mcg Nebulization Daily Barnetta Chapel, PA-C   175 mcg at 05/24/23 0815   rivaroxaban (XARELTO) tablet 20 mg  20 mg Oral Q supper Maretta Bees, MD   20 mg at 05/23/23 1632   sodium chloride flush (NS) 0.9 % injection 10-40 mL  10-40 mL Intracatheter PRN [place], Alvira Philips, DO  20 mL at 05/19/23 1740   spironolactone (ALDACTONE) tablet 25 mg  25 mg Oral Daily Maretta Bees, MD   25 mg at 05/23/23 8295   torsemide (DEMADEX) tablet 40 mg  40 mg Oral Daily Maretta Bees, MD   40 mg at 05/23/23 6213    Allergies as of 04/22/2023 - Review Complete 04/22/2023  Allergen Reaction Noted   Allopurinol Other (See Comments) 09/08/2015   Clindamycin Anaphylaxis and Hives 10/29/2014   Flublok [influenza vaccine recombinant] Other (See Comments) 03/31/2022   Pneumococcal 13-val conj vacc Itching, Swelling, and Rash 11/21/2014   Dronedarone Rash 10/29/2014   Montelukast Other (See Comments) 02/12/2023   Brovana [arformoterol]  08/21/2021   Budesonide  08/21/2021   Entresto [sacubitril-valsartan] Other (See Comments) 06/07/2018   Fosamax [alendronate sodium] Nausea Only 01/25/2019   Jardiance [empagliflozin]  08/21/2021   Meperidine Nausea And Vomiting 10/29/2014   Microplegia msa-msg [plegisol]  10/04/2021   Rosuvastatin Other (See Comments) 10/29/2014   Tetracycline Hives 10/29/2014   Adhesive [tape] Rash 09/17/2015   Lovastatin Rash and Other (See Comments) 05/23/2013     Social History   Socioeconomic History   Marital status: Widowed    Spouse name: Not on file   Number of children: 1   Years of education: Not on file   Highest education level: Not on file  Occupational History   Occupation: retired  Tobacco Use   Smoking status: Former    Current packs/day: 0.00    Average packs/day: 1 pack/day for 20.0 years (20.0 ttl pk-yrs)    Types: Cigarettes    Start date: 06/09/1967    Quit date: 06/09/1987    Years since quitting: 35.9    Passive exposure: Past   Smokeless tobacco: Never   Tobacco comments:    Former smoker 12/25/21  Vaping Use   Vaping status: Never Used  Substance and Sexual Activity   Alcohol use: No    Alcohol/week: 0.0 standard drinks of alcohol   Drug use: No   Sexual activity: Not Currently  Other Topics Concern   Not on file  Social History Narrative   Pt lives in [place] alone.  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  Her son works in [employer].   Social Drivers of Corporate investment banker Strain: Low Risk  (05/21/2022)   Overall Financial Resource Strain (CARDIA)    Difficulty of Paying Living Expenses: Not very hard  Food Insecurity: No Food Insecurity (04/22/2023)   Hunger Vital Sign    Worried About Running Out of Food in the Last Year: Never true    Ran Out of Food in the Last Year: Never true  Transportation Needs: No Transportation Needs (04/22/2023)   PRAPARE - Administrator, Civil Service (Medical): No    Lack of Transportation (Non-Medical): No  Physical Activity: Insufficiently Active (05/21/2022)   Exercise Vital Sign    Days of Exercise per Week: 2 days    Minutes of Exercise per Session: 20 min  Stress: No Stress Concern Present (05/21/2022)   [employer] of Occupational Health - Occupational Stress Questionnaire    Feeling of Stress : Only a little  Social Connections: Moderately Isolated (05/21/2022)   Social Connection and Isolation Panel [NHANES]     Frequency of Communication with Friends and Family: More than three times a week    Frequency of Social Gatherings with Friends and Family: More than three times a week    Attends  Religious Services: Never    Active Member of Clubs or Organizations: Yes    Attends Banker Meetings: More than 4 times per year    Marital Status: Widowed  Intimate Partner Violence: Not At Risk (04/22/2023)   Humiliation, Afraid, Rape, and Kick questionnaire    Fear of Current or Ex-Partner: No    Emotionally Abused: No    Physically Abused: No    Sexually Abused: No     Code Status   Code Status: Limited: Do not attempt resuscitation (DNR) -DNR-LIMITED -Do Not Intubate/DNI   Review of Systems: All systems reviewed and negative except where noted in HPI.  Physical Exam: Vital signs in last 24 hours: Temp:  [97.9 F (36.6 C)-98.3 F (36.8 C)] 98.3 F (36.8 C) (12/16 0743) Pulse Rate:  [68-80] 78 (12/16 0815) Resp:  [14-18] 14 (12/16 0815) BP: (108-132)/(47-58) 114/58 (12/16 0743) SpO2:  [98 %-100 %] 100 % (12/16 0815) Last BM Date : 05/22/23  General:  Pleasant female in NAD Psych:  Cooperative. Normal mood and affect Eyes: Pupils equal Ears:  Normal auditory acuity Nose: No deformity, discharge or lesions Neck:  Supple, no masses felt Lungs:  Rhonchi in chest, otherwise lungs CTA  Heart:  Regular rate Abdomen:  Soft, nondistended,  a few bowel sounds, large surgical dressing intact. Colostomy with thick liquid brown stool, no output in two  RLQ surgical drains.  Rectal :  Deferred Msk: Symmetrical without gross deformities.  Neurologic:  Alert, oriented, grossly normal neurologically Extremities : No edema Skin:  Intact without significant lesions.    Intake/Output from previous day: 12/15 0701 - 12/16 0700 In: 1095.4 [I.V.:877; IV Piggyback:218.4] Out: -  Intake/Output this shift:  No intake/output data recorded.   Willette Cluster, NP-C   05/24/2023, 9:26  AM

## 2023-05-24 NOTE — Plan of Care (Signed)
  Problem: Education: Goal: Ability to describe self-care measures that may prevent or decrease complications (Diabetes Survival Skills Education) will improve Outcome: Not Progressing Goal: Individualized Educational Video(s) Outcome: Not Progressing   Problem: Coping: Goal: Ability to adjust to condition or change in health will improve Outcome: Not Progressing   Problem: Fluid Volume: Goal: Ability to maintain a balanced intake and output will improve Outcome: Not Progressing   Problem: Health Behavior/Discharge Planning: Goal: Ability to identify and utilize available resources and services will improve Outcome: Not Progressing Goal: Ability to manage health-related needs will improve Outcome: Not Progressing   Problem: Metabolic: Goal: Ability to maintain appropriate glucose levels will improve Outcome: Not Progressing   Problem: Nutritional: Goal: Maintenance of adequate nutrition will improve Outcome: Not Progressing Goal: Progress toward achieving an optimal weight will improve Outcome: Not Progressing   Problem: Tissue Perfusion: Goal: Adequacy of tissue perfusion will improve Outcome: Not Progressing

## 2023-05-24 NOTE — Progress Notes (Signed)
PROGRESS NOTE        PATIENT DETAILS Name: Kathleen [name] Age: 77 y.o. Sex: female Date of Birth: 1946/01/06 Admit Date: 04/22/2023 Admitting Physician Gery Pray, MD [id number], Bernadene Bell, MD  Brief Summary:  Patient is a 77 y.o.  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  Admitted by [facility]-started on empiric IV antibiotics-underwent preop assessment by cardiology-and then subsequently underwent  exploratory laparotomy with sigmoid colectomy and end ostomy on 11/19.  Postoperative course complicated by ileus-requiring NG tube decompression-and TNA for nutrition.  See below for further details.  Significant events: 11/14>> admit to [facility] 11/19>>exploratory laparotomy with sigmoid colectomy and end ostomy  12/04>> NG removed-clear liquid started  Significant studies: 11/14>> CT abdomen/pelvis: Worsening signal right diverticulitis-localized perforation-5.6 cm perirectal abscess, 6.0 cm left lower quadrant abscess. 11/22>> x-ray abdomen: Consistent with postoperative ileus. 11/25>> CT abdomen/pelvis: No abscess 12/01>> CT abdomen/pelvis: Mild small bowel ileus-no obvious abscess-small fluid collection in the colonic stump  Significant microbiology data: 11/14>> blood culture: No growth 11/14>> COVID/influenza/RSV PCR: Negative  Procedures: 11/19>>exploratory laparotomy with sigmoid colectomy  end ostomy on 11/19 12/15 >> CT -  1. 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 2. Small postoperative collection in the posterior right lower peritoneal cavity, slightly decreased. No new fluid collections. 3. Scattered fat stranding and ill-defined fluid in the midline ventral abdominal  laparotomy site without measurable fluid collection. 4. Ovoid 1.1 cm hyperdense structure in the lower thoracic esophagus, cannot exclude a ingested foreign body, either retained oral contrast or pill fragment. 5. Subpleural 0.4 cm peripheral right lower lobe pulmonary nodule, decreased from 0.6 cm, favoring a benign etiology. 6.  Aortic Atherosclerosis (ICD10-I70.0).    Consults: Cardiology GI General surgery  Subjective: Patient in bed, no headache or chest pain but increased abdominal pain throughout last night with nausea, she says she is feeling worse today than she has felt in the last several days.  Objective: Vitals: Blood pressure (!) 108/51, pulse 78, temperature 98.1 F (36.7 C), temperature source Oral, resp. rate 14, height 5' (1.524 m), weight 94.1 kg, SpO2 100%.   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    Surgery following, JP drain and colostomy in place, continues to have intermittent nausea, CT repeated on 05/23/2023 and case discussed with general surgeon Dr. Cliffton Asters, continue supportive care, CT scan does show questionable retained foreign body in the esophagus.  Will check with [facility].    Hypokalemia/hypophosphatemia/hypomagnesemia Replete per  pharmacy TNA protocol.  Normocytic anemia Secondary to critical illness No evidence of blood loss  Transfused 1 additional unit of PRBC on 12/10 Continue to follow CBC periodically.  Persistent atrial fibrillation Telemetry monitoring Tikosyn orally-attempt to keep K> 4 Initially on IV heparin-has been switched to Xarelto  History of AV nodal ablation-s/p PPM ([employer]) Telemetry monitoring  Chronic HFrEF (EF 35-40% on 4/9 by TTE) Volume status reasonably stable but some leg edema   Continue on Lasix/Aldactone   Mild hyponatremia.  On diuretics, check urine osmolality and urine electrolytes.    Pannus cellulitis.  Nystatin powder and monitor.    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  GERD PPI  DM-2 (A1c 6.2 on 11/15) CBGs stable on SSI  CBG (last 3)  Recent Labs    05/23/23 1630 05/23/23 2113 05/24/23 0827  GLUCAP 175* 180* 174*    Nutrition Status: Nutrition Problem: Moderate Malnutrition Etiology: chronic illness Signs/Symptoms: mild fat depletion, mild muscle depletion, energy intake < 75% for > 7 days Interventions: Ensure Enlive (each supplement provides 350kcal and 20 grams of protein), Magic cup, MVI  Obesity: Estimated body mass index is 40.52 kg/m as calculated from the following:   Height as of this encounter: 5' (1.524 m).   Weight as of this encounter: 94.1 kg.   Code status:   Code Status: Limited: Do not attempt resuscitation (DNR) -DNR-LIMITED -Do Not Intubate/DNI    DVT Prophylaxis: IV heparinrivaroxaban (XARELTO) tablet 20 mg    Family Communication: None at bedside.   Disposition Plan: Status is: Inpatient Remains inpatient appropriate because: Severity of illness   Planned Discharge Destination:Skilled nursing facility   Diet: Diet  Order             DIET SOFT Fluid consistency: Thin  Diet effective now                   MEDICATIONS: Scheduled Meds:  (feeding supplement) PROSource Plus  30 mL Oral BID BM   sodium chloride   Intravenous Once   acetaminophen  650 mg Oral Q6H   Chlorhexidine Gluconate Cloth  6 each Topical Daily   docusate sodium  100 mg Oral BID   dofetilide  250 mcg Oral Q12H   feeding supplement  237 mL Oral TID BM   fluticasone furoate-vilanterol  1 puff Inhalation Daily   gabapentin  200 mg Oral QHS   insulin aspart  0-5 Units Subcutaneous QHS   insulin aspart  0-9 Units Subcutaneous TID WC   lidocaine  1 patch Transdermal Q24H   magic mouthwash  5 mL Oral QID   magnesium oxide  400 mg Oral BID   methocarbamol  750 mg Oral Q6H   multivitamin with minerals  1 tablet Oral Daily   nystatin   Topical TID   mouth rinse  15 mL Mouth Rinse 4 times per day   pantoprazole  40 mg Oral BID   polyethylene glycol  17 g Oral TID   revefenacin  175 mcg Nebulization Daily   rivaroxaban  20 mg Oral Q supper   spironolactone  25 mg Oral Daily   torsemide  40 mg Oral Daily   Continuous Infusions:  .TPN (CLINIMIX-E) Adult 42 mL/hr at 05/23/23 1735   .TPN (CLINIMIX-E) Adult     magnesium sulfate bolus IVPB 2 g (05/24/23 0839)   potassium chloride     PRN Meds:.albuterol, guaiFENesin-dextromethorphan, HYDROmorphone (DILAUDID) injection, metoCLOPramide (REGLAN) injection, ondansetron (ZOFRAN) IV, oxyCODONE,  sodium chloride flush   I have personally reviewed following labs and imaging studies  LABORATORY DATA:  Recent Labs  Lab 05/18/23 0419 05/19/23 0206 05/20/23 0259 05/23/23 0426 05/24/23 0322  WBC 9.4 9.1 8.1 9.9 10.7*  HGB 7.0* 8.1* 8.3* 9.2* 9.3*  HCT 23.0* 26.0* 26.6* 29.4* 30.2*  PLT 215 198 217 272 296  MCV 84.9 85.0 83.6 82.8 83.2  MCH 25.8* 26.5 26.1 25.9* 25.6*  MCHC 30.4 31.2 31.2 31.3 30.8  RDW 17.7* 17.0* 17.0* 16.6* 16.4*  LYMPHSABS  --   --   --   --  1.1  MONOABS   --   --   --   --  0.9  EOSABS  --   --   --   --  0.8*  BASOSABS  --   --   --   --  0.1    Recent Labs  Lab 05/20/23 0259 05/21/23 0313 05/22/23 0449 05/23/23 0426 05/24/23 0322  NA 134* 131* 129* 132* 130*  K 3.4* 3.9 4.1 3.8 3.8  CL 97* 94* 95* 91* 93*  CO2 29 29 28 27 28   ANIONGAP 8 8 6 14 9   GLUCOSE 179* 213* 193* 206* 214*  BUN 25* 28* 36* 37* 37*  CREATININE 0.54 0.54 0.57 0.62 0.65  AST 11*  --   --   --  12*  ALT 8  --   --   --  9  ALKPHOS 51  --   --   --  57  BILITOT 0.7  --   --   --  0.5  ALBUMIN 2.4*  --   --   --  2.7*  MG 1.7 1.7 2.1  --  1.9  CALCIUM 10.3 9.9 9.3 10.1 10.0    Lab Results  Component Value Date   CHOL 174 08/07/2021   HDL 35.00 (L) 08/07/2021   LDLCALC 99 08/07/2021   TRIG 145 05/24/2023   CHOLHDL 5 08/07/2021      Recent Labs  Lab 05/20/23 0259 05/21/23 0313 05/22/23 0449 05/23/23 0426 05/24/23 0322  MG 1.7 1.7 2.1  --  1.9  CALCIUM 10.3 9.9 9.3 10.1 10.0    RADIOLOGY STUDIES/RESULTS: CT ABDOMEN PELVIS WO CONTRAST Result Date: 05/23/2023 CLINICAL DATA:  Postoperative abdominal pain, follow-up abscesses. Inpatient. EXAM: CT ABDOMEN AND PELVIS WITHOUT CONTRAST TECHNIQUE: Multidetector CT imaging of the abdomen and pelvis was performed following the standard protocol without IV contrast. RADIATION DOSE REDUCTION: This exam was performed according to the departmental dose-optimization program which includes automated exposure control, adjustment of the mA and/or kV according to patient size and/or use of iterative reconstruction technique. COMPARISON:  05/16/2023 CT abdomen/pelvis. FINDINGS: Lower chest: Mild platelike bibasilar atelectasis. Subpleural 0.4 cm peripheral right lower lobe pulmonary nodule (series 4/image 8), decreased from 0.6 cm. Pacemaker leads partially visualized in the right atrium and right ventricle. Ovoid 1.1 cm hyperdense structure in the lower thoracic esophagus. Hepatobiliary: Top-normal liver size. Simple  1.1 cm lateral segment left liver cyst with additional scattered subcentimeter hypodense liver lesions are too small to characterize and unchanged. No new liver lesions. Cholecystectomy no biliary ductal dilatation. Pancreas: Normal, with no mass or duct dilation. Spleen: Normal size. No mass. Adrenals/Urinary Tract: Normal adrenals. No hydronephrosis. No renal stones. Simple 3.0 cm exophytic lateral lower left renal cyst, for which no follow-up imaging is recommended. Normal bladder. Stomach/Bowel: Normal non-distended stomach. Status post partial distal colectomy with end colostomy in the ventral left abdominal wall and with Gertie Gowda  pouch. No dilated or thick-walled small bowel loops. Appendix not discretely visualized. Mobile cecum located in the anterior abdomen along the midline. Retained enteric dense contrast in the collapsed Hartmann pouch. Right-sided surgical drains terminate in the left pericolic gutter and right lower quadrant, unchanged. Small 3.5 x 1.7 cm postoperative collection in the posterior right lower peritoneal cavity (series 3/image 47), previously 3.8 x 2.2 cm, slightly decreased. No new fluid collections. Vascular/Lymphatic: Atherosclerotic nonaneurysmal abdominal aorta. No pathologically enlarged lymph nodes in the abdomen or pelvis. Reproductive: Status post hysterectomy, with no abnormal findings at the vaginal cuff. No adnexal mass. Other: No pneumoperitoneum. No ascites. Scattered fat stranding and ill-defined fluid in the midline ventral abdominal laparotomy site without measurable fluid collection. Musculoskeletal: No aggressive appearing focal osseous lesions. IMPRESSION: 1. 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 2. Small postoperative collection in the posterior right lower peritoneal cavity, slightly decreased. No new fluid collections. 3. Scattered fat stranding and ill-defined fluid in the midline  ventral abdominal laparotomy site without measurable fluid collection. 4. Ovoid 1.1 cm hyperdense structure in the lower thoracic esophagus, cannot exclude a ingested foreign body, either retained oral contrast or pill fragment. 5. Subpleural 0.4 cm peripheral right lower lobe pulmonary nodule, decreased from 0.6 cm, favoring a benign etiology. 6.  Aortic Atherosclerosis (ICD10-I70.0). Electronically Signed   By: Delbert Phenix M.D.   On: 05/23/2023 11:37     LOS: 32 days   Signature  -    Susa Raring M.D on 05/24/2023 at 8:52 AM   -  To page go to [email or web address]

## 2023-05-24 NOTE — Op Note (Signed)
[facility] Patient Name: Kathleen [name] Procedure Date : 05/24/2023 MRN: [id number] Attending MD: Beverley Fiedler , MD, [id number] Date of Birth: [birth year] CSN: [id number] Age: 76 Admit Type: Inpatient Procedure:                Upper GI endoscopy Indications:              Abnormal CT of the GI tract (question of esophageal                            foreign body), Nausea Providers:                Carie Caddy. Rhea Belton, MD, Lorenza Evangelist, RN, Rogue Jury, RN, Priscella Mann, Technician Referring MD:             [facility] Medicines:                Monitored Anesthesia Care Complications:            No immediate complications. Estimated Blood Loss:     Estimated blood loss: none. Procedure:                Pre-Anesthesia Assessment:                           - Prior to the procedure, a History and Physical                            was performed, and patient medications and                            allergies were reviewed. The patient's tolerance of                            previous anesthesia was also reviewed. The risks                            and benefits of the procedure and the sedation                            options and risks were discussed with the patient.                            All questions were answered, and informed consent                            was obtained. Prior Anticoagulants: The patient has                            taken no anticoagulant or antiplatelet agents. ASA                            Grade Assessment: III - A patient with severe  systemic disease. After reviewing the risks and                            benefits, the patient was deemed in satisfactory                            condition to undergo the procedure.                           After obtaining informed consent, the endoscope was                            passed under direct vision. Throughout the                             procedure, the patient's blood pressure, pulse, and                            oxygen saturations were monitored continuously. The                            [id number]-H190 ([id number]) Olympus endoscope was introduced                            through the mouth, and advanced to the second part                            of duodenum. The upper GI endoscopy was                            accomplished without difficulty. The patient                            tolerated the procedure well. Scope In: Scope Out: Findings:      Patchy, white plaques were found in the entire esophagus.      The exam of the esophagus was otherwise normal.      The entire examined stomach was normal.      The examined duodenum was normal. Impression:               - Esophageal plaques were found, consistent with                            candidiasis.                           - No evidence of esophageal foreign body.                           - Normal stomach.                           - Normal examined duodenum.                           - No specimens collected. Moderate Sedation:  N/A Recommendation:           - Patient has a contact number available for                            emergencies. The signs and symptoms of potential                            delayed complications were discussed with the                            patient. Return to normal activities tomorrow.                            Written discharge instructions were provided to the                            patient.                           - Resume previous diet.                           - Continue present medications.                           - Fluconazole 400 mg x 1 day, 200 mg x 13 days for                            Candida esophagitis.                           - GI inpatient service will sign off, call if                            questions. Procedure Code(s):        --- Professional ---                            [phone], Esophagogastroduodenoscopy, flexible,                            transoral; diagnostic, including collection of                            specimen(s) by brushing or washing, when performed                            (separate procedure) Diagnosis Code(s):        --- Professional ---                           K22.9, Disease of esophagus, unspecified                           R11.0, Nausea  R93.3, Abnormal findings on diagnostic imaging of                            other parts of digestive tract CPT copyright 2022 [employer]. All rights reserved. The codes documented in this report are preliminary and upon coder review may  be revised to meet current compliance requirements. Beverley Fiedler, MD 05/24/2023 11:03:01 AM This report has been signed electronically. Number of Addenda: 0

## 2023-05-24 NOTE — Transfer of Care (Signed)
Immediate Anesthesia Transfer of Care Note  Patient: Kathleen [name]  Procedure(s) Performed: ESOPHAGOGASTRODUODENOSCOPY (EGD) WITH PROPOFOL  Patient Location: Endoscopy Unit  Anesthesia Type:General  Level of Consciousness: drowsy  Airway & Oxygen Therapy: Patient Spontanous Breathing  Post-op Assessment: Report given to RN and Post -op Vital signs reviewed and stable  Post vital signs: Reviewed and stable  Last Vitals:  Vitals Value Taken Time  BP 108/74 05/24/23 1121  Temp    Pulse 74 05/24/23 1122  Resp 16 05/24/23 1122  SpO2 100 % 05/24/23 1122  Vitals shown include unfiled device data.  Last Pain:  Vitals:   05/24/23 1008  TempSrc: Temporal  PainSc:       Patients Stated Pain Goal: 0 (05/21/23 2300)  Complications: No notable events documented.

## 2023-05-24 NOTE — Progress Notes (Signed)
Progress Note  * Day of Surgery *  Subjective: Pt underwent EGD earlier today for possible esophageal FB, did not see anything there. Does appear to have some esophageal candidiasis. Still has poor appetite but took in 2 ensure yesterday. Pain stable.   Objective: Vital signs in last 24 hours: Temp:  [97.8 F (36.6 C)-98.3 F (36.8 C)] 98 F (36.7 C) (12/16 1121) Pulse Rate:  [69-80] 70 (12/16 1121) Resp:  [14-25] 25 (12/16 1121) BP: (108-148)/(47-74) 108/74 (12/16 1121) SpO2:  [98 %-100 %] 100 % (12/16 1120) Weight:  [94.1 kg] 94.1 kg (12/16 1008) Last BM Date : 05/24/23  Intake/Output from previous day: 12/15 0701 - 12/16 0700 In: 1095.4 [I.V.:877; IV Piggyback:218.4] Out: -  Intake/Output this shift: Total I/O In: 80 [I.V.:80] Out: -   PE: Gen:  Alert, NAD Card:  RRR Pulm:  rate and effort normal on supplemental O2 via [place] Abd: soft, obese, not significantly distended, minimal tenderness around her incision, incision stable, there is yeast dermatitis under her pannus and in her inguinal creases. Stoma viable.  JP#1 and #2 scant serous fluid   Lab Results:  Recent Labs    05/23/23 0426 05/24/23 0322  WBC 9.9 10.7*  HGB 9.2* 9.3*  HCT 29.4* 30.2*  PLT 272 296   BMET Recent Labs    05/23/23 0426 05/24/23 0322  NA 132* 130*  K 3.8 3.8  CL 91* 93*  CO2 27 28  GLUCOSE 206* 214*  BUN 37* 37*  CREATININE 0.62 0.65  CALCIUM 10.1 10.0   PT/INR No results for input(s): "LABPROT", "INR" in the last 72 hours. CMP     Component Value Date/Time   NA 130 (L) 05/24/2023 0322   NA 130 (L) 12/28/2012 0748   K 3.8 05/24/2023 0322   K 4.6 12/28/2012 0748   CL 93 (L) 05/24/2023 0322   CL 95 (L) 12/28/2012 0748   CO2 28 05/24/2023 0322   CO2 23 12/28/2012 0748   GLUCOSE 214 (H) 05/24/2023 0322   GLUCOSE 391 (H) 12/28/2012 0748   BUN 37 (H) 05/24/2023 0322   BUN 32 (H) 12/28/2012 0748   CREATININE 0.65 05/24/2023 0322   CREATININE 0.86 01/27/2023 1137    CALCIUM 10.0 05/24/2023 0322   CALCIUM 14.3 (HH) 06/26/2019 1618   PROT 5.8 (L) 05/24/2023 0322   ALBUMIN 2.7 (L) 05/24/2023 0322   AST 12 (L) 05/24/2023 0322   ALT 9 05/24/2023 0322   ALKPHOS 57 05/24/2023 0322   BILITOT 0.5 05/24/2023 0322   GFRNONAA >60 05/24/2023 0322   GFRNONAA 43 (L) 12/28/2012 0748   GFRAA 58 (L) 06/28/2019 0427   GFRAA 50 (L) 12/28/2012 0748   Lipase     Component Value Date/Time   LIPASE 30 04/22/2023 1840       Studies/Results: CT ABDOMEN PELVIS WO CONTRAST Result Date: 05/23/2023 CLINICAL DATA:  Postoperative abdominal pain, follow-up abscesses. Inpatient. EXAM: CT ABDOMEN AND PELVIS WITHOUT CONTRAST TECHNIQUE: Multidetector CT imaging of the abdomen and pelvis was performed following the standard protocol without IV contrast. RADIATION DOSE REDUCTION: This exam was performed according to the departmental dose-optimization program which includes automated exposure control, adjustment of the mA and/or kV according to patient size and/or use of iterative reconstruction technique. COMPARISON:  05/16/2023 CT abdomen/pelvis. FINDINGS: Lower chest: Mild platelike bibasilar atelectasis. Subpleural 0.4 cm peripheral right lower lobe pulmonary nodule (series 4/image 8), decreased from 0.6 cm. Pacemaker leads partially visualized in the right atrium and right ventricle. Ovoid 1.1 cm  hyperdense structure in the lower thoracic esophagus. Hepatobiliary: Top-normal liver size. Simple 1.1 cm lateral segment left liver cyst with additional scattered subcentimeter hypodense liver lesions are too small to characterize and unchanged. No new liver lesions. Cholecystectomy no biliary ductal dilatation. Pancreas: Normal, with no mass or duct dilation. Spleen: Normal size. No mass. Adrenals/Urinary Tract: Normal adrenals. No hydronephrosis. No renal stones. Simple 3.0 cm exophytic lateral lower left renal cyst, for which no follow-up imaging is recommended. Normal bladder.  Stomach/Bowel: Normal non-distended stomach. 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 No dilated or thick-walled small bowel loops. Appendix not discretely visualized. Mobile cecum located in the anterior abdomen along the midline. Retained enteric dense contrast in the collapsed Hartmann pouch. Right-sided surgical drains terminate in the left pericolic gutter and right lower quadrant, unchanged. Small 3.5 x 1.7 cm postoperative collection in the posterior right lower peritoneal cavity (series 3/image 47), previously 3.8 x 2.2 cm, slightly decreased. No new fluid collections. Vascular/Lymphatic: Atherosclerotic nonaneurysmal abdominal aorta. No pathologically enlarged lymph nodes in the abdomen or pelvis. Reproductive: Status post hysterectomy, with no abnormal findings at the vaginal cuff. No adnexal mass. Other: No pneumoperitoneum. No ascites. Scattered fat stranding and ill-defined fluid in the midline ventral abdominal laparotomy site without measurable fluid collection. Musculoskeletal: No aggressive appearing focal osseous lesions. IMPRESSION: 1. 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 2. Small postoperative collection in the posterior right lower peritoneal cavity, slightly decreased. No new fluid collections. 3. Scattered fat stranding and ill-defined fluid in the midline ventral abdominal laparotomy site without measurable fluid collection. 4. Ovoid 1.1 cm hyperdense structure in the lower thoracic esophagus, cannot exclude a ingested foreign body, either retained oral contrast or pill fragment. 5. Subpleural 0.4 cm peripheral right lower lobe pulmonary nodule, decreased from 0.6 cm, favoring a benign etiology. 6.  Aortic Atherosclerosis (ICD10-I70.0). Electronically Signed   By: Delbert Phenix M.D.   On: 05/23/2023 11:37     Anti-infectives: Anti-infectives (From admission, onward)    Start     Dose/Rate Route Frequency Ordered Stop   05/25/23 1000  fluconazole (DIFLUCAN) tablet 200 mg        200 mg Oral Daily 05/24/23 1106 06/07/23 0959   05/24/23 1115  fluconazole (DIFLUCAN) IVPB 400 mg        400 mg 100 mL/hr over 120 Minutes Intravenous Every 24 hours 05/24/23 1106 05/25/23 1114   05/10/23 1000  piperacillin-tazobactam (ZOSYN) IVPB 3.375 g  Status:  Discontinued        3.375 g 12.5 mL/hr over 240 Minutes Intravenous Every 8 hours 05/10/23 0914 05/17/23 1317   05/03/23 1400  piperacillin-tazobactam (ZOSYN) IVPB 3.375 g        3.375 g 12.5 mL/hr over 240 Minutes Intravenous Every 8 hours 05/03/23 0935 05/07/23 0124   04/27/23 1021  sodium chloride 0.9 % with cefoTEtan (CEFOTAN) ADS Med       Note to Pharmacy: Shanda Bumps M: cabinet override      04/27/23 1021 04/27/23 1253   04/26/23 0800  cefoTEtan (CEFOTAN) 2 g in sodium chloride 0.9 % 100 mL IVPB  Status:  Discontinued        2 g 200 mL/hr over 30 Minutes Intravenous To ShortStay Surgical 04/23/23 2111 04/27/23 0800   04/22/23 2300  piperacillin-tazobactam (ZOSYN) IVPB 3.375 g        3.375 g 12.5 mL/hr  over 240 Minutes Intravenous Every 8 hours 04/22/23 2256 05/02/23 2359   04/22/23 1900  piperacillin-tazobactam (ZOSYN) IVPB 3.375 g        3.375 g 100 mL/hr over 30 Minutes Intravenous  Once 04/22/23 1852 04/22/23 1954        Assessment/Plan  POD25 S/p Exploratory laparotomy, sigmoid colectomy, colostomy 11/19/24Dwain Sarna - CT 12/8 fairly unchanged with mild ileus and small 3.8x2.2cm fluid collection near colonic stump.  JP drain x2 previously purulent, they are serous today and minimal output. Continue drains for now but may consider removal somewhat soon if they remain serous and low output  - CT yesterday with slight decrease in RLQ collection, no new collections - WBC 10.7, afebrile. Off antibiotics - Continues to have some  abdominal pain and distention. Ostomy output is thick, cont miralax. KUB 12/12 reassuring. - Reglan PRN for nausea. - Continue 1/2 TPN again today - wean tomorrow if continuing to tolerate PO - WOC following for new ostomy. Stoma with some mucocutaneous separation, monitor. Will need ostomy clinic referral at discharge  - BID WTD to midline wound  - Nystatin powder for skin folds, ok to use interdry also  - oob, pulm toilet   ID zosyn 11/14>>11/29, 12/2>>12/9 VTE - xarelto for afib  FEN - 1/2 TPN, reg diet, Ensure   LOS: 32 days    Juliet Rude, [facility] Surgery 05/24/2023, 1:47 PM Please see Amion for pager number during day hours 7:00am-4:30pm

## 2023-05-24 NOTE — TOC Progression Note (Signed)
Transition of Care [facility]) - Progression Note    Patient Details  Name: Kathleen [name] MRN: [id number] Date of Birth: 1946/05/13  Transition of Care [facility] CM/SW Contact  Mearl Latin, LCSW Phone Number: 05/24/2023, 8:42 AM  Clinical Narrative:    CSW continuing to follow.    Expected Discharge Plan: Skilled Nursing Facility Barriers to Discharge: Continued Medical Work up, SNF Pending bed offer  Expected Discharge Plan and Services In-house Referral: Clinical Social Work   Post Acute Care Choice: Skilled Nursing Facility Living arrangements for the past 2 months: Independent Living Facility ([facility])                                       Social Determinants of Health (SDOH) Interventions SDOH Screenings   Food Insecurity: No Food Insecurity (04/22/2023)  Housing: Low Risk  (04/22/2023)  Transportation Needs: No Transportation Needs (04/22/2023)  Utilities: Not At Risk (04/22/2023)  Alcohol Screen: Low Risk  (05/21/2022)  Depression (PHQ2-9): Low Risk  (05/21/2022)  Financial Resource Strain: Low Risk  (05/21/2022)  Physical Activity: Insufficiently Active (05/21/2022)  Social Connections: Moderately Isolated (05/21/2022)  Stress: No Stress Concern Present (05/21/2022)  Tobacco Use: Medium Risk (04/27/2023)    Readmission Risk Interventions    05/18/2023   12:10 PM 04/26/2023    9:51 AM 01/14/2023   11:16 AM  Readmission Risk Prevention Plan  Transportation Screening Complete Complete Complete  PCP or Specialist Appt within 5-7 Days   Complete  PCP or Specialist Appt within 3-5 Days  Complete   Home Care Screening   Complete  Medication Review (RN CM)   Complete  HRI or Home Care Consult  Complete   Palliative Care Screening  --   Medication Review (RN Care Manager) Complete Complete   PCP or Specialist appointment within 3-5 days of discharge Complete    HRI or Home Care Consult Complete    SW Recovery Care/Counseling Consult Complete     Palliative Care Screening Not Applicable    Skilled Nursing Facility Complete

## 2023-05-24 NOTE — Anesthesia Postprocedure Evaluation (Signed)
Anesthesia Post Note  Patient: Kathleen [name]  Procedure(s) Performed: ESOPHAGOGASTRODUODENOSCOPY (EGD) WITH PROPOFOL     Patient location during evaluation: Endoscopy Anesthesia Type: MAC Level of consciousness: awake Pain management: pain level controlled Vital Signs Assessment: post-procedure vital signs reviewed and stable Respiratory status: spontaneous breathing, nonlabored ventilation and respiratory function stable Cardiovascular status: blood pressure returned to baseline and stable Postop Assessment: no apparent nausea or vomiting Anesthetic complications: no   No notable events documented.  Last Vitals:  Vitals:   05/24/23 1600 05/24/23 1958  BP:  129/61  Pulse: 71 74  Resp: 16 15  Temp:  36.6 C  SpO2: 97% 98%    Last Pain:  Vitals:   05/24/23 1958  TempSrc: Oral  PainSc:                  Rayner Erman P Kylia Grajales

## 2023-05-24 NOTE — Anesthesia Preprocedure Evaluation (Addendum)
Anesthesia Evaluation  Patient identified by MRN, date of birth, ID band Patient awake    Reviewed: Allergy & Precautions, NPO status , Patient's Chart, lab work & pertinent test results  Airway Mallampati: II  TM Distance: >3 FB Neck ROM: Full    Dental no notable dental hx.    Pulmonary asthma , COPD (4L [place]),  COPD inhaler and oxygen dependent, former smoker   Pulmonary exam normal        Cardiovascular hypertension, +CHF  Normal cardiovascular exam+ dysrhythmias Atrial Fibrillation + pacemaker      Neuro/Psych  PSYCHIATRIC DISORDERS  Depression     Neuromuscular disease    GI/Hepatic negative GI ROS, Neg liver ROS,,,  Endo/Other    Class 3 obesity  Renal/GU Renal disease     Musculoskeletal  (+) Arthritis ,    Abdominal  (+) + obese  Peds  Hematology  (+) Blood dyscrasia, anemia   Anesthesia Other Findings Abnormal CT scan with possible esophageal foreign body and nausea    Reproductive/Obstetrics                             Anesthesia Physical Anesthesia Plan  ASA: 4  Anesthesia Plan: MAC   Post-op Pain Management:    Induction: Intravenous  PONV Risk Score and Plan: 2 and Propofol infusion and Treatment may vary due to age or medical condition  Airway Management Planned: Nasal Cannula  Additional Equipment:   Intra-op Plan:   Post-operative Plan:   Informed Consent: I have reviewed the patients History and Physical, chart, labs and discussed the procedure including the risks, benefits and alternatives for the proposed anesthesia with the patient or authorized representative who has indicated his/her understanding and acceptance.   Patient has DNR.  Discussed DNR with patient and Suspend DNR.     Plan Discussed with: CRNA  Anesthesia Plan Comments:        Anesthesia Quick Evaluation

## 2023-05-25 ENCOUNTER — Inpatient Hospital Stay (HOSPITAL_COMMUNITY): Payer: Medicare Other

## 2023-05-25 DIAGNOSIS — K5720 Diverticulitis of large intestine with perforation and abscess without bleeding: Secondary | ICD-10-CM | POA: Diagnosis not present

## 2023-05-25 LAB — CBC WITH DIFFERENTIAL/PLATELET
Abs Immature Granulocytes: 0.49 10*3/uL — ABNORMAL HIGH (ref 0.00–0.07)
Basophils Absolute: 0.1 10*3/uL (ref 0.0–0.1)
Basophils Relative: 1 %
Eosinophils Absolute: 0 10*3/uL (ref 0.0–0.5)
Eosinophils Relative: 0 %
HCT: 29.2 % — ABNORMAL LOW (ref 36.0–46.0)
Hemoglobin: 9 g/dL — ABNORMAL LOW (ref 12.0–15.0)
Immature Granulocytes: 4 %
Lymphocytes Relative: 7 %
Lymphs Abs: 0.9 10*3/uL (ref 0.7–4.0)
MCH: 25.7 pg — ABNORMAL LOW (ref 26.0–34.0)
MCHC: 30.8 g/dL (ref 30.0–36.0)
MCV: 83.4 fL (ref 80.0–100.0)
Monocytes Absolute: 0.7 10*3/uL (ref 0.1–1.0)
Monocytes Relative: 5 %
Neutro Abs: 11.1 10*3/uL — ABNORMAL HIGH (ref 1.7–7.7)
Neutrophils Relative %: 83 %
Platelets: 285 10*3/uL (ref 150–400)
RBC: 3.5 MIL/uL — ABNORMAL LOW (ref 3.87–5.11)
RDW: 15.9 % — ABNORMAL HIGH (ref 11.5–15.5)
WBC: 13.2 10*3/uL — ABNORMAL HIGH (ref 4.0–10.5)
nRBC: 0.2 % (ref 0.0–0.2)

## 2023-05-25 LAB — GLUCOSE, CAPILLARY
Glucose-Capillary: 197 mg/dL — ABNORMAL HIGH (ref 70–99)
Glucose-Capillary: 161 mg/dL — ABNORMAL HIGH (ref 70–99)
Glucose-Capillary: 173 mg/dL — ABNORMAL HIGH (ref 70–99)
Glucose-Capillary: 150 mg/dL — ABNORMAL HIGH (ref 70–99)

## 2023-05-25 LAB — PROCALCITONIN: Procalcitonin: <0.1 ng/mL

## 2023-05-25 LAB — C-REACTIVE PROTEIN: CRP: 1.3 mg/dL — ABNORMAL HIGH (ref <1.0)

## 2023-05-25 MED ORDER — SPIRONOLACTONE 25 MG PO TABS
25.0000 mg | ORAL_TABLET | Freq: Every day | ORAL | Status: DC
  Administered 2023-05-27: 25 mg via ORAL
  Filled 2023-05-25: qty 1

## 2023-05-25 MED ORDER — ALTEPLASE 2 MG IJ SOLR
2.0000 mg | Freq: Once | INTRAMUSCULAR | Status: AC
  Administered 2023-05-25: 2 mg
  Filled 2023-05-25: qty 2

## 2023-05-25 MED ORDER — CLINIMIX E/DEXTROSE (8/10) 8 % IV SOLN
INTRAVENOUS | Status: DC
Start: 2023-05-25 — End: 2023-05-25
  Filled 2023-05-25: qty 1000

## 2023-05-25 MED ORDER — LACTATED RINGERS IV SOLN: INTRAVENOUS | Status: AC

## 2023-05-25 MED ORDER — DICLOFENAC SODIUM 1 % EX GEL
2.0000 g | Freq: Two times a day (BID) | CUTANEOUS | Status: DC
  Administered 2023-05-25 – 2023-05-27 (×5): 2 g via TOPICAL
  Filled 2023-05-25 (×2): qty 100

## 2023-05-25 MED ORDER — TORSEMIDE 20 MG PO TABS
40.0000 mg | ORAL_TABLET | Freq: Every day | ORAL | Status: DC
  Administered 2023-05-27: 40 mg via ORAL
  Filled 2023-05-25: qty 2

## 2023-05-25 NOTE — Plan of Care (Signed)
  Problem: Education: Goal: Ability to describe self-care measures that may prevent or decrease complications (Diabetes Survival Skills Education) will improve Outcome: Progressing   Problem: Coping: Goal: Ability to adjust to condition or change in health will improve Outcome: Progressing   Problem: Fluid Volume: Goal: Ability to maintain a balanced intake and output will improve Outcome: Progressing   Problem: Health Behavior/Discharge Planning: Goal: Ability to identify and utilize available resources and services will improve Outcome: Progressing   Problem: Metabolic: Goal: Ability to maintain appropriate glucose levels will improve Outcome: Progressing   Problem: Nutritional: Goal: Maintenance of adequate nutrition will improve Outcome: Progressing   Problem: Skin Integrity: Goal: Risk for impaired skin integrity will decrease Outcome: Progressing   Problem: Tissue Perfusion: Goal: Adequacy of tissue perfusion will improve Outcome: Progressing   

## 2023-05-25 NOTE — Progress Notes (Signed)
Progress Note  1 Day Post-Op  Subjective: Pt ate some rice and an ensure yesterday evening. Denies nausea or vomiting. Having ostomy output.   Objective: Vital signs in last 24 hours: Temp:  [97.7 F (36.5 C)-98.2 F (36.8 C)] 97.7 F (36.5 C) (12/17 0751) Pulse Rate:  [69-74] 72 (12/17 0751) Resp:  [14-25] 18 (12/17 0751) BP: (97-148)/(54-76) 97/76 (12/17 0751) SpO2:  [97 %-100 %] 98 % (12/17 0400) Weight:  [94.1 kg] 94.1 kg (12/16 1008) Last BM Date : 05/24/23  Intake/Output from previous day: 12/16 0701 - 12/17 0700 In: 555.8 [I.V.:555.8] Out: 200 [Stool:200] Intake/Output this shift: No intake/output data recorded.  PE: Gen:  Alert, NAD Pulm:  rate and effort normal on supplemental O2 via [place] Abd: soft, obese, not significantly distended Stoma viable with soft brown stool in bag.  JP#1 and #2 scant slightly cloudy fluid   Lab Results:  Recent Labs    05/24/23 0322 05/25/23 0337  WBC 10.7* 13.2*  HGB 9.3* 9.0*  HCT 30.2* 29.2*  PLT 296 285   BMET Recent Labs    05/23/23 0426 05/24/23 0322  NA 132* 130*  K 3.8 3.8  CL 91* 93*  CO2 27 28  GLUCOSE 206* 214*  BUN 37* 37*  CREATININE 0.62 0.65  CALCIUM 10.1 10.0   PT/INR No results for input(s): "LABPROT", "INR" in the last 72 hours. CMP     Component Value Date/Time   NA 130 (L) 05/24/2023 0322   NA 130 (L) 12/28/2012 0748   K 3.8 05/24/2023 0322   K 4.6 12/28/2012 0748   CL 93 (L) 05/24/2023 0322   CL 95 (L) 12/28/2012 0748   CO2 28 05/24/2023 0322   CO2 23 12/28/2012 0748   GLUCOSE 214 (H) 05/24/2023 0322   GLUCOSE 391 (H) 12/28/2012 0748   BUN 37 (H) 05/24/2023 0322   BUN 32 (H) 12/28/2012 0748   CREATININE 0.65 05/24/2023 0322   CREATININE 0.86 01/27/2023 1137   CALCIUM 10.0 05/24/2023 0322   CALCIUM 14.3 (HH) 06/26/2019 1618   PROT 5.8 (L) 05/24/2023 0322   ALBUMIN 2.7 (L) 05/24/2023 0322   AST 12 (L) 05/24/2023 0322   ALT 9 05/24/2023 0322   ALKPHOS 57 05/24/2023 0322    BILITOT 0.5 05/24/2023 0322   GFRNONAA >60 05/24/2023 0322   GFRNONAA 43 (L) 12/28/2012 0748   GFRAA 58 (L) 06/28/2019 0427   GFRAA 50 (L) 12/28/2012 0748   Lipase     Component Value Date/Time   LIPASE 30 04/22/2023 1840       Studies/Results: DG Chest Port 1 View Result Date: 05/25/2023 CLINICAL DATA:  Short of breath, cough EXAM: PORTABLE CHEST 1 VIEW COMPARISON:  05/04/2023, 05/23/2023 FINDINGS: Single frontal view of the chest demonstrates stable multi lead pacer. Right-sided PICC tip overlies superior vena cava. The cardiac silhouette is enlarged but stable. Chronic atelectasis at the left lung base. Stable left pleural thickening based on prior CT exam. No airspace disease, effusion, or pneumothorax. IMPRESSION: 1. Stable left basilar atelectasis or scarring and chronic left pleural thickening. 2. No acute intrathoracic process. Electronically Signed   By: Sharlet Salina M.D.   On: 05/25/2023 09:27   CT ABDOMEN PELVIS WO CONTRAST Result Date: 05/23/2023 CLINICAL DATA:  Postoperative abdominal pain, follow-up abscesses. Inpatient. EXAM: CT ABDOMEN AND PELVIS WITHOUT CONTRAST TECHNIQUE: Multidetector CT imaging of the abdomen and pelvis was performed following the standard protocol without IV contrast. RADIATION DOSE REDUCTION: This exam was performed according to the  departmental dose-optimization program which includes automated exposure control, adjustment of the mA and/or kV according to patient size and/or use of iterative reconstruction technique. COMPARISON:  05/16/2023 CT abdomen/pelvis. FINDINGS: Lower chest: Mild platelike bibasilar atelectasis. Subpleural 0.4 cm peripheral right lower lobe pulmonary nodule (series 4/image 8), decreased from 0.6 cm. Pacemaker leads partially visualized in the right atrium and right ventricle. Ovoid 1.1 cm hyperdense structure in the lower thoracic esophagus. Hepatobiliary: Top-normal liver size. Simple 1.1 cm lateral segment left liver cyst with  additional scattered subcentimeter hypodense liver lesions are too small to characterize and unchanged. No new liver lesions. Cholecystectomy no biliary ductal dilatation. Pancreas: Normal, with no mass or duct dilation. Spleen: Normal size. No mass. Adrenals/Urinary Tract: Normal adrenals. No hydronephrosis. No renal stones. Simple 3.0 cm exophytic lateral lower left renal cyst, for which no follow-up imaging is recommended. Normal bladder. Stomach/Bowel: Normal non-distended stomach. 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 No dilated or thick-walled small bowel loops. Appendix not discretely visualized. Mobile cecum located in the anterior abdomen along the midline. Retained enteric dense contrast in the collapsed Hartmann pouch. Right-sided surgical drains terminate in the left pericolic gutter and right lower quadrant, unchanged. Small 3.5 x 1.7 cm postoperative collection in the posterior right lower peritoneal cavity (series 3/image 47), previously 3.8 x 2.2 cm, slightly decreased. No new fluid collections. Vascular/Lymphatic: Atherosclerotic nonaneurysmal abdominal aorta. No pathologically enlarged lymph nodes in the abdomen or pelvis. Reproductive: Status post hysterectomy, with no abnormal findings at the vaginal cuff. No adnexal mass. Other: No pneumoperitoneum. No ascites. Scattered fat stranding and ill-defined fluid in the midline ventral abdominal laparotomy site without measurable fluid collection. Musculoskeletal: No aggressive appearing focal osseous lesions. IMPRESSION: 1. 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 2. Small postoperative collection in the posterior right lower peritoneal cavity, slightly decreased. No new fluid collections. 3. Scattered fat stranding and ill-defined fluid in the midline ventral abdominal laparotomy site without  measurable fluid collection. 4. Ovoid 1.1 cm hyperdense structure in the lower thoracic esophagus, cannot exclude a ingested foreign body, either retained oral contrast or pill fragment. 5. Subpleural 0.4 cm peripheral right lower lobe pulmonary nodule, decreased from 0.6 cm, favoring a benign etiology. 6.  Aortic Atherosclerosis (ICD10-I70.0). Electronically Signed   By: Delbert Phenix M.D.   On: 05/23/2023 11:37    Anti-infectives: Anti-infectives (From admission, onward)    Start     Dose/Rate Route Frequency Ordered Stop   05/25/23 1000  fluconazole (DIFLUCAN) tablet 200 mg        200 mg Oral Daily 05/24/23 1106 06/07/23 0959   05/24/23 1115  fluconazole (DIFLUCAN) IVPB 400 mg        400 mg 100 mL/hr over 120 Minutes Intravenous Every 24 hours 05/24/23 1106 05/24/23 1502   05/10/23 1000  piperacillin-tazobactam (ZOSYN) IVPB 3.375 g  Status:  Discontinued        3.375 g 12.5 mL/hr over 240 Minutes Intravenous Every 8 hours 05/10/23 0914 05/17/23 1317   05/03/23 1400  piperacillin-tazobactam (ZOSYN) IVPB 3.375 g        3.375 g 12.5 mL/hr over 240 Minutes Intravenous Every 8 hours 05/03/23 0935 05/07/23 0124   04/27/23 1021  sodium chloride 0.9 % with cefoTEtan (CEFOTAN) ADS Med       Note to Pharmacy: Shanda Bumps M: cabinet override      04/27/23 1021  04/27/23 1253   04/26/23 0800  cefoTEtan (CEFOTAN) 2 g in sodium chloride 0.9 % 100 mL IVPB  Status:  Discontinued        2 g 200 mL/hr over 30 Minutes Intravenous To ShortStay Surgical 04/23/23 2111 04/27/23 0800   04/22/23 2300  piperacillin-tazobactam (ZOSYN) IVPB 3.375 g        3.375 g 12.5 mL/hr over 240 Minutes Intravenous Every 8 hours 04/22/23 2256 05/02/23 2359   04/22/23 1900  piperacillin-tazobactam (ZOSYN) IVPB 3.375 g        3.375 g 100 mL/hr over 30 Minutes Intravenous  Once 04/22/23 1852 04/22/23 1954        Assessment/Plan  POD26 S/p Exploratory laparotomy, sigmoid colectomy, colostomy 11/19/24Dwain Sarna - CT  12/8 fairly unchanged with mild ileus and small 3.8x2.2cm fluid collection near colonic stump.  JP drain x2 previously purulent, they are serous today and minimal output. Continue drains for now  - CT yesterday with slight decrease in RLQ collection, no new collections - WBC 13, afebrile. Off antibiotics. Monitor  - Continues to have some abdominal pain and distention. Ostomy output is thick, cont miralax. KUB 12/12 reassuring. - Reglan PRN for nausea. - stop TPN and monitor PO intake - WOC following for new ostomy. Stoma with some mucocutaneous separation, monitor. Will need ostomy clinic referral at discharge  - BID WTD to midline wound  - Nystatin powder for skin folds, ok to use interdry also  - oob, pulm toilet   ID zosyn 11/14>11/29, 12/2>12/9 VTE - xarelto for afib  FEN - stop TPN, reg diet, Ensure   LOS: 33 days    Juliet Rude, [facility] Surgery 05/25/2023, 10:02 AM Please see Amion for pager number during day hours 7:00am-4:30pm

## 2023-05-25 NOTE — Plan of Care (Signed)
   Problem: Education: Goal: Ability to describe self-care measures that may prevent or decrease complications (Diabetes Survival Skills Education) will improve Outcome: Not Progressing Goal: Individualized Educational Video(s) Outcome: Not Progressing   Problem: Coping: Goal: Ability to adjust to condition or change in health will improve Outcome: Not Progressing   Problem: Fluid Volume: Goal: Ability to maintain a balanced intake and output will improve Outcome: Not Progressing   Problem: Health Behavior/Discharge Planning: Goal: Ability to identify and utilize available resources and services will improve Outcome: Not Progressing Goal: Ability to manage health-related needs will improve Outcome: Not Progressing   Problem: Metabolic: Goal: Ability to maintain appropriate glucose levels will improve Outcome: Not Progressing   Problem: Nutritional: Goal: Maintenance of adequate nutrition will improve Outcome: Not Progressing Goal: Progress toward achieving an optimal weight will improve Outcome: Not Progressing   Problem: Skin Integrity: Goal: Risk for impaired skin integrity will decrease Outcome: Not Progressing   Problem: Tissue Perfusion: Goal: Adequacy of tissue perfusion will improve Outcome: Not Progressing

## 2023-05-25 NOTE — TOC Progression Note (Signed)
Transition of Care [facility]) - Progression Note    Patient Details  Name: Kathleen [name] MRN: [id number] Date of Birth: 09/06/1945  Transition of Care [facility]) CM/SW Contact  Mearl Latin, LCSW Phone Number: 05/25/2023, 4:41 PM  Clinical Narrative:    CSW spoke with patient about change in PT recommendation but she is not comfortable with wound care and is requesting SNF for rehab initially.    Expected Discharge Plan: Skilled Nursing Facility Barriers to Discharge: Continued Medical Work up, SNF Pending bed offer  Expected Discharge Plan and Services In-house Referral: Clinical Social Work   Post Acute Care Choice: Skilled Nursing Facility Living arrangements for the past 2 months: Independent Living Facility ([facility])                                       Social Determinants of Health (SDOH) Interventions SDOH Screenings   Food Insecurity: No Food Insecurity (04/22/2023)  Housing: Low Risk  (04/22/2023)  Transportation Needs: No Transportation Needs (04/22/2023)  Utilities: Not At Risk (04/22/2023)  Alcohol Screen: Low Risk  (05/21/2022)  Depression (PHQ2-9): Low Risk  (05/21/2022)  Financial Resource Strain: Low Risk  (05/21/2022)  Physical Activity: Insufficiently Active (05/21/2022)  Social Connections: Moderately Isolated (05/21/2022)  Stress: No Stress Concern Present (05/21/2022)  Tobacco Use: Medium Risk (05/24/2023)    Readmission Risk Interventions    05/18/2023   12:10 PM 04/26/2023    9:51 AM 01/14/2023   11:16 AM  Readmission Risk Prevention Plan  Transportation Screening Complete Complete Complete  PCP or Specialist Appt within 5-7 Days   Complete  PCP or Specialist Appt within 3-5 Days  Complete   Home Care Screening   Complete  Medication Review (RN CM)   Complete  HRI or Home Care Consult  Complete   Palliative Care Screening  --   Medication Review (RN Care Manager) Complete Complete   PCP or Specialist appointment within 3-5 days  of discharge Complete    HRI or Home Care Consult Complete    SW Recovery Care/Counseling Consult Complete    Palliative Care Screening Not Applicable    Skilled Nursing Facility Complete

## 2023-05-25 NOTE — [place] FL2 (Signed)
[place] MEDICAID FL2 LEVEL OF CARE FORM     IDENTIFICATION  Patient Name: Kathleen [name] Birthdate: Apr 25, 1946 Sex: female Admission Date (Current Location): 04/22/2023  [facility] and [place] Number:  Producer, television/film/video and Address:  [facility], [address], [place], [place] [address]      Provider Number: [id number]  Attending Physician Name and Address:  Leroy Sea, MD  Relative Name and Phone Number:  Chalsea Mcbrayer; Shari Heritage; [phone]    Current Level of Care: Hospital Recommended Level of Care: Skilled Nursing Facility Prior Approval Number:    Date Approved/Denied:   PASRR Number: [id number]  Discharge Plan: SNF    Current Diagnoses: Patient Active Problem List   Diagnosis Date Noted   Candida esophagitis (HCC) 05/24/2023   Preop cardiovascular exam 04/23/2023   Diverticulitis of colon with perforation 04/22/2023   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   Pill dysphagia 04/06/2023   COPD exacerbation (HCC) 03/26/2023   Absolute anemia 03/17/2023   LLQ pain 03/17/2023   Chronic nausea 03/17/2023   Hypokalemia 02/23/2023   Diverticulitis 02/22/2023   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   Carpal tunnel syndrome, right upper limb 01/27/2023   Acute exacerbation of chronic obstructive pulmonary disease (COPD) (HCC) 01/13/2023   Dysuria 11/26/2022   Hypercoagulable state due to persistent atrial fibrillation (HCC) 11/18/2022   Acute on chronic heart failure with preserved ejection fraction (HCC) 10/26/2022   COPD with acute exacerbation (HCC) 10/09/2022   Pain in left shoulder 07/23/2022   Chronic obstructive pulmonary disease (HCC) 06/28/2022   Palliative care encounter 06/28/2022   Post-viral cough syndrome 06/02/2022   Paranasal sinus disease 05/19/2022   Heart failure with reduced ejection fraction (HCC) 05/14/2022   Pneumonia 04/29/2022   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   Depression,  major, single episode, moderate (HCC) 04/27/2022   Obesity (BMI 30-39.9) 03/18/2022   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   Low TSH level 12/02/2021   Acute idiopathic gout of left hand 11/19/2021   Acquired hypothyroidism 11/12/2021   Encounter for palliative care involving management of pain 11/12/2021   Post concussive syndrome 11/11/2021   Chronic left-sided low back pain 11/04/2021   Gout 10/01/2021   Hyperlipidemia with target LDL less than 100 08/07/2021   Chronic anticoagulation 07/07/2021   History of colon polyps 07/07/2021   Stage 3a chronic kidney disease (HCC) 05/23/2021   Dietary iron deficiency 05/23/2021   Age-related osteoporosis without current pathological fracture 04/22/2021   Type 2 diabetes mellitus with diabetic neuropathy, without long-term current use of insulin (HCC) 04/22/2021   Sleep apnea 01/22/2021   Statin intolerance 10/12/2019   Statin myopathy 02/24/2019   Chronic combined systolic and diastolic heart failure (HCC) 04/06/2018   HTN (hypertension) 04/06/2018   COPD (chronic obstructive pulmonary disease) (HCC) 04/06/2018   Persistent atrial fibrillation (HCC) 04/06/2018   Chronic respiratory failure with hypoxia (HCC) 03/28/2018   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 12/02/2015   Protein-calorie malnutrition, moderate (HCC) 09/11/2015    Orientation RESPIRATION BLADDER Height & Weight     Self, Time, Situation, Place  O2 (4L nasal cannula) Continent Weight: 207 lb 7.3 oz (94.1 kg) Height:  5' (152.4 cm)  BEHAVIORAL SYMPTOMS/MOOD NEUROLOGICAL BOWEL NUTRITION STATUS      Incontinent, Colostomy Diet (Please see dc summary)  AMBULATORY STATUS COMMUNICATION OF NEEDS Skin   Limited Assist Verbally Other (Comment) (Closed System Drain 1 and Drain 2 Right RLQ Bulb (JP); Wound on Abdomen Left;)  Personal Care Assistance Level of Assistance  Bathing, Feeding, Dressing Bathing Assistance: Maximum  assistance Feeding assistance: Independent Dressing Assistance: Limited assistance     Functional Limitations Info  Sight Sight Info: Impaired (Reading glasses)        SPECIAL CARE FACTORS FREQUENCY  PT (By licensed PT), OT (By licensed OT)     PT Frequency: 5x OT Frequency: 5x            Contractures Contractures Info: Not present    Additional Factors Info  Code Status, Allergies Code Status Info: DNR-LIMITED -Do Not Intubate/DNI Allergies Info: Allopurinol; Clindamycin; Flublok (influenza Vaccine Recombinant); Pneumococcal 13-val Conj Vacc; Dronedarone; Montelukast; Brovana (arformoterol); Budesonide; Entresto (sacubitril-valsartan); Fosamax (alendronate Sodium); Jardiance (empagliflozin); Meperidine; Microplegia Msa-msg (plegisol); Rosuvastatin; Tetracycline; Adhesvie (tape); Lovastatin           Current Medications (05/25/2023):  This is the current hospital active medication list Current Facility-Administered Medications  Medication Dose Route Frequency Provider Last Rate Last Admin   (feeding supplement) PROSource Plus liquid 30 mL  30 mL Oral BID BM Alphia Moh D, RPH   30 mL at 05/25/23 1409   .TPN (CLINIMIX-E) Adult   Intravenous Continuous TPN Reome, Elisha Headland, RPH 42 mL/hr at 05/24/23 1722 New Bag at 05/24/23 1722   0.9 %  sodium chloride infusion (Manually program via Guardrails IV Fluids)   Intravenous Once Maretta Bees, MD       acetaminophen (TYLENOL) tablet 650 mg  650 mg Oral Q6H Meuth, Brooke A, PA-C   650 mg at 05/25/23 1220   albuterol (PROVENTIL) (2.5 MG/3ML) 0.083% nebulizer solution 2.5 mg  2.5 mg Nebulization Q2H PRN Barnetta Chapel, PA-C   2.5 mg at 05/21/23 0139   Chlorhexidine Gluconate Cloth 2 % PADS 6 each  6 each Topical Daily Glade Lloyd, MD   6 each at 05/25/23 0846   diclofenac Sodium (VOLTAREN) 1 % topical gel 2 g  2 g Topical BID Leroy Sea, MD   2 g at 05/25/23 1408   docusate sodium (COLACE) capsule 100 mg  100 mg Oral BID  Meuth, Brooke A, PA-C   100 mg at 05/25/23 0844   dofetilide (TIKOSYN) capsule 250 mcg  250 mcg Oral Q12H Barnetta Chapel, PA-C   250 mcg at 05/25/23 1610   feeding supplement (ENSURE ENLIVE / ENSURE PLUS) liquid 237 mL  237 mL Oral TID BM Meuth, Brooke A, PA-C   237 mL at 05/25/23 0846   fluconazole (DIFLUCAN) tablet 200 mg  200 mg Oral Daily Pyrtle, Carie Caddy, MD   200 mg at 05/25/23 0843   fluticasone furoate-vilanterol (BREO ELLIPTA) 100-25 MCG/ACT 1 puff  1 puff Inhalation Daily Barnetta Chapel, PA-C   1 puff at 05/25/23 9604   gabapentin (NEURONTIN) capsule 200 mg  200 mg Oral QHS Maretta Bees, MD   200 mg at 05/24/23 2027   guaiFENesin-dextromethorphan (ROBITUSSIN DM) 100-10 MG/5ML syrup 5 mL  5 mL Oral Q4H PRN Maretta Bees, MD   5 mL at 05/23/23 1636   HYDROmorphone (DILAUDID) injection 0.5-1 mg  0.5-1 mg Intravenous Q3H PRN Meuth, Brooke A, PA-C   1 mg at 05/25/23 1228   insulin aspart (novoLOG) injection 0-5 Units  0-5 Units Subcutaneous QHS Gerilyn Nestle, [facility]   2 Units at 05/24/23 2144   insulin aspart (novoLOG) injection 0-9 Units  0-9 Units Subcutaneous TID WC Gerilyn Nestle, RPH   2 Units at 05/25/23 1221   lactated ringers infusion   Intravenous  Continuous Leroy Sea, MD 100 mL/hr at 05/25/23 0856 New Bag at 05/25/23 0856   lidocaine (LIDODERM) 5 % 1 patch  1 patch Transdermal Q24H Meuth, Brooke A, PA-C   1 patch at 05/25/23 6962   magic mouthwash  5 mL Oral QID Elgergawy, Leana Roe, MD   5 mL at 05/25/23 1408   magnesium oxide (MAG-OX) tablet 400 mg  400 mg Oral BID Maretta Bees, MD   400 mg at 05/25/23 0843   methocarbamol (ROBAXIN) tablet 750 mg  750 mg Oral Q6H Meuth, Brooke A, PA-C   750 mg at 05/25/23 0844   metoCLOPramide (REGLAN) injection 5 mg  5 mg Intravenous Q6H PRN Meuth, Brooke A, PA-C   5 mg at 05/25/23 1227   multivitamin with minerals tablet 1 tablet  1 tablet Oral Daily Leander Rams, [facility]   1 tablet at 05/25/23 9528   nystatin (MYCOSTATIN/NYSTOP)  topical powder   Topical TID Adam Phenix, PA-C   Given at 05/25/23 0846   ondansetron (ZOFRAN) injection 4 mg  4 mg Intravenous Q6H PRN Meuth, Brooke A, PA-C   4 mg at 05/25/23 4132   Oral care mouth rinse  15 mL Mouth Rinse 4 times per day Glade Lloyd, MD   15 mL at 05/25/23 1228   oxyCODONE (Oxy IR/ROXICODONE) immediate release tablet 5-10 mg  5-10 mg Oral Q4H PRN Meuth, Brooke A, PA-C   10 mg at 05/23/23 1631   pantoprazole (PROTONIX) EC tablet 40 mg  40 mg Oral BID Leander Rams, RPH   40 mg at 05/25/23 0843   polyethylene glycol (MIRALAX / GLYCOLAX) packet 17 g  17 g Oral TID Adam Phenix, PA-C   17 g at 05/25/23 4401   revefenacin (YUPELRI) nebulizer solution 175 mcg  175 mcg Nebulization Daily Barnetta Chapel, PA-C   175 mcg at 05/25/23 0272   rivaroxaban (XARELTO) tablet 20 mg  20 mg Oral Q supper Maretta Bees, MD   20 mg at 05/24/23 1609   sodium chloride flush (NS) 0.9 % injection 10-40 mL  10-40 mL Intracatheter PRN [place], Alvira Philips, DO   20 mL at 05/19/23 1740   [START ON 05/27/2023] spironolactone (ALDACTONE) tablet 25 mg  25 mg Oral Daily Leroy Sea, MD       [START ON 05/27/2023] torsemide (DEMADEX) tablet 40 mg  40 mg Oral Daily Leroy Sea, MD         Discharge Medications: Please see discharge summary for a list of discharge medications.  Relevant Imaging Results:  Relevant Lab Results:   Additional Information SS# [id number]  Mearl Latin, LCSW

## 2023-05-25 NOTE — Progress Notes (Signed)
PROGRESS NOTE        PATIENT DETAILS Name: Kathleen [name] Age: 77 y.o. Sex: female Date of Birth: 11-Oct-1945 Admit Date: 04/22/2023 Admitting Physician Gery Pray, MD QIO:NGEXB, Bernadene Bell, MD  Brief Summary:  Patient is a 77 y.o.  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  Admitted by [facility]-started on empiric IV antibiotics-underwent preop assessment by cardiology-and then subsequently underwent  exploratory laparotomy with sigmoid colectomy and end ostomy on 11/19.  Postoperative course complicated by ileus-requiring NG tube decompression-and TNA for nutrition.  See below for further details.  Significant events: 11/14>> admit to [facility] 11/19>>exploratory laparotomy with sigmoid colectomy and end ostomy  12/04>> NG removed-clear liquid started  Significant studies: 11/14>> CT abdomen/pelvis: Worsening signal right diverticulitis-localized perforation-5.6 cm perirectal abscess, 6.0 cm left lower quadrant abscess. 11/22>> x-ray abdomen: Consistent with postoperative ileus. 11/25>> CT abdomen/pelvis: No abscess 12/01>> CT abdomen/pelvis: Mild small bowel ileus-no obvious abscess-small fluid collection in the colonic stump  Significant microbiology data: 11/14>> blood culture: No growth 11/14>> COVID/influenza/RSV PCR: Negative  Procedures:  11/19>>exploratory laparotomy with sigmoid colectomy  end ostomy on 11/19 12/15 >> CT -  1. 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 2. Small postoperative collection in the posterior right lower peritoneal cavity, slightly decreased. No new fluid collections. 3. Scattered fat stranding and ill-defined fluid in the midline ventral abdominal  laparotomy site without measurable fluid collection. 4. Ovoid 1.1 cm hyperdense structure in the lower thoracic esophagus, cannot exclude a ingested foreign body, either retained oral contrast or pill fragment. 5. Subpleural 0.4 cm peripheral right lower lobe pulmonary nodule, decreased from 0.6 cm, favoring a benign etiology. 6.  Aortic Atherosclerosis (ICD10-I70.0).   EGD 05/24/23  Impression:               - Esophageal plaques were found, consistent with                            candidiasis.                           - No evidence of esophageal foreign body.                           - Normal stomach.                           - Normal examined duodenum.                           - No specimens collected. Moderate Sedation:      N/A Recommendation:           - Patient has a contact number available for                            emergencies. The signs and symptoms of potential  delayed complications were discussed with the                            patient. Return to normal activities tomorrow.                            Written discharge instructions were provided to the                            patient.                           - Resume previous diet.                           - Continue present medications.                           - Fluconazole 400 mg x 1 day, 200 mg x 13 days for                            Candida esophagitis.                           - GI inpatient service will sign off, call if                            questions.   Consults: Cardiology GI General surgery  Subjective: Patient in bed, appears comfortable, denies any headache, no fever, no chest pain or pressure, no shortness of breath , no abdominal pain, improved nausea today. No focal weakness.  Objective: Vitals: Blood pressure 97/76, pulse 72, temperature 97.7 F (36.5 C), temperature source Oral, resp. rate 18, height 5' (1.524 m), weight 94.1 kg, SpO2 98%.    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    Surgery following, JP drain and colostomy in place, continues to have intermittent nausea, CT repeated on 05/23/2023 and case discussed with general surgeon Dr. Cliffton Asters, continue supportive care, CT scan does show questionable retained foreign body in the esophagus.  GI consulted.   Esophageal candidiasis likely contributing to her nausea.  EGD done by GI on 05/24/2023.  Oral nystatin as recommended by GI and follow-up.    Hypokalemia/hypophosphatemia/hypomagnesemia Replete per pharmacy TNA protocol.  Normocytic anemia Secondary to critical illness No evidence of blood loss Transfused 1 additional unit of PRBC on 12/10 Continue to follow CBC periodically.  Persistent atrial fibrillation Telemetry monitoring Tikosyn orally-attempt to keep K> 4 Initially on IV heparin-has been switched to Xarelto  History  of AV nodal ablation-s/p PPM ([employer]) Telemetry monitoring  Chronic HFrEF (EF 35-40% on 4/9 by TTE) Volume status reasonably stable but some leg edema   Continue on Lasix/Aldactone   Mild hyponatremia.  On diuretics, urine sodium less than 10 despite being on diuretics suggesting dehydration, skip diuretics for 2 days  starting 05/25/2023 and gently hydrate for 10 hours.  Pannus cellulitis.  Nystatin powder and monitor.    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  GERD PPI  DM-2 (A1c 6.2 on 11/15) CBGs stable on SSI  CBG (last 3)  Recent Labs    05/24/23 1606 05/24/23 2119 05/25/23 0755  GLUCAP 231* 231* 197*    Nutrition Status: Nutrition Problem: Moderate Malnutrition Etiology: chronic illness Signs/Symptoms: mild fat depletion, mild muscle depletion, energy intake < 75% for > 7 days Interventions: Ensure Enlive (each supplement provides 350kcal and 20 grams of protein), Magic cup, MVI  Obesity: Estimated body mass index is 40.52 kg/m as calculated from the following:   Height as of this encounter: 5' (1.524 m).   Weight as of this encounter: 94.1 kg.   Code status:   Code Status: Limited: Do not attempt resuscitation (DNR) -DNR-LIMITED -Do Not Intubate/DNI    DVT Prophylaxis: IV heparinrivaroxaban (XARELTO) tablet 20 mg    Family Communication: None at bedside.   Disposition Plan: Status is: Inpatient Remains inpatient appropriate because: Severity of illness   Planned Discharge Destination:Skilled nursing facility   Diet: Diet Order             DIET SOFT Fluid consistency: Thin  Diet effective now                   MEDICATIONS: Scheduled Meds:  (feeding supplement) PROSource Plus  30 mL Oral BID BM   sodium chloride   Intravenous Once   acetaminophen  650 mg Oral Q6H   Chlorhexidine Gluconate Cloth  6 each Topical Daily   diclofenac Sodium  2 g Topical BID   docusate sodium  100 mg Oral BID   dofetilide  250 mcg Oral Q12H   feeding supplement  237 mL Oral TID BM   fluconazole  200 mg Oral Daily   fluticasone furoate-vilanterol  1 puff Inhalation Daily    gabapentin  200 mg Oral QHS   insulin aspart  0-5 Units Subcutaneous QHS   insulin aspart  0-9 Units Subcutaneous TID WC   lidocaine  1 patch Transdermal Q24H   magic mouthwash  5 mL Oral QID   magnesium oxide  400 mg Oral BID   methocarbamol  750 mg Oral Q6H   multivitamin with minerals  1 tablet Oral Daily   nystatin   Topical TID   mouth rinse  15 mL Mouth Rinse 4 times per day   pantoprazole  40 mg Oral BID   polyethylene glycol  17 g Oral TID   revefenacin  175 mcg Nebulization Daily   rivaroxaban  20 mg Oral Q supper   spironolactone  25 mg Oral Daily   torsemide  40 mg Oral Daily   Continuous Infusions:  .TPN (CLINIMIX-E) Adult 42 mL/hr at 05/24/23 1722   .TPN (CLINIMIX-E) Adult     PRN Meds:.albuterol, guaiFENesin-dextromethorphan, HYDROmorphone (DILAUDID) injection, metoCLOPramide (REGLAN) injection, ondansetron (ZOFRAN) IV, oxyCODONE, sodium chloride flush   I have personally reviewed following labs and imaging studies  LABORATORY DATA:  Recent Labs  Lab 05/19/23 0206 05/20/23 0259 05/23/23 0426 05/24/23 0322 05/25/23 0337  WBC 9.1 8.1 9.9 10.7* 13.2*  HGB 8.1* 8.3* 9.2* 9.3* 9.0*  HCT 26.0* 26.6* 29.4* 30.2* 29.2*  PLT 198 217 272 296 285  MCV 85.0 83.6 82.8 83.2 83.4  MCH 26.5 26.1 25.9* 25.6* 25.7*  MCHC 31.2 31.2 31.3 30.8 30.8  RDW 17.0* 17.0* 16.6* 16.4* 15.9*  LYMPHSABS  --   --   --  1.1 0.9  MONOABS  --   --   --  0.9 0.7  EOSABS  --   --   --  0.8* 0.0  BASOSABS  --   --   --  0.1 0.1    Recent Labs  Lab 05/20/23 0259 05/21/23 0313 05/22/23 0449 05/23/23 0426 05/24/23 0322  NA 134* 131* 129* 132* 130*  K 3.4* 3.9 4.1 3.8 3.8  CL 97* 94* 95* 91* 93*  CO2 29 29 28 27 28   ANIONGAP 8 8 6 14 9   GLUCOSE 179* 213* 193* 206* 214*  BUN 25* 28* 36* 37* 37*  CREATININE 0.54 0.54 0.57 0.62 0.65  AST 11*  --   --   --  12*  ALT 8  --   --   --  9  ALKPHOS 51  --   --   --  57  BILITOT 0.7  --   --   --  0.5  ALBUMIN 2.4*  --   --   --  2.7*   MG 1.7 1.7 2.1  --  1.9  CALCIUM 10.3 9.9 9.3 10.1 10.0    Lab Results  Component Value Date   CHOL 174 08/07/2021   HDL 35.00 (L) 08/07/2021   LDLCALC 99 08/07/2021   TRIG 145 05/24/2023   CHOLHDL 5 08/07/2021      Recent Labs  Lab 05/20/23 0259 05/21/23 0313 05/22/23 0449 05/23/23 0426 05/24/23 0322  MG 1.7 1.7 2.1  --  1.9  CALCIUM 10.3 9.9 9.3 10.1 10.0    RADIOLOGY STUDIES/RESULTS: CT ABDOMEN PELVIS WO CONTRAST Result Date: 05/23/2023 CLINICAL DATA:  Postoperative abdominal pain, follow-up abscesses. Inpatient. EXAM: CT ABDOMEN AND PELVIS WITHOUT CONTRAST TECHNIQUE: Multidetector CT imaging of the abdomen and pelvis was performed following the standard protocol without IV contrast. RADIATION DOSE REDUCTION: This exam was performed according to the departmental dose-optimization program which includes automated exposure control, adjustment of the mA and/or kV according to patient size and/or use of iterative reconstruction technique. COMPARISON:  05/16/2023 CT abdomen/pelvis. FINDINGS: Lower chest: Mild platelike bibasilar atelectasis. Subpleural 0.4 cm peripheral right lower lobe pulmonary nodule (series 4/image 8), decreased from 0.6 cm. Pacemaker leads partially visualized in the right atrium and right ventricle. Ovoid 1.1 cm hyperdense structure in the lower thoracic esophagus. Hepatobiliary: Top-normal liver size. Simple 1.1 cm lateral segment left liver cyst with additional scattered subcentimeter hypodense liver lesions are too small to characterize and unchanged. No new liver lesions. Cholecystectomy no biliary ductal dilatation. Pancreas: Normal, with no mass or duct dilation. Spleen: Normal size. No mass. Adrenals/Urinary Tract: Normal adrenals. No hydronephrosis. No renal stones. Simple 3.0 cm exophytic lateral lower left renal cyst, for which no follow-up imaging is recommended. Normal bladder. Stomach/Bowel: Normal non-distended stomach. Status post partial distal  colectomy with end colostomy in the ventral left abdominal wall and with Hartmann pouch. No dilated or thick-walled small bowel loops. Appendix not discretely visualized. Mobile cecum located in the anterior abdomen along the midline. Retained enteric dense contrast  in the collapsed [place]loge pouch. Right-sided surgical drains terminate in the left pericolic gutter and right lower quadrant, unchanged. Small 3.5 x 1.7 cm postoperative collection in the posterior right lower peritoneal cavity (series 3/image 47), previously 3.8 x 2.2 cm, slightly decreased. No new fluid collections. Vascular/Lymphatic: Atherosclerotic nonaneurysmal abdominal aorta. No pathologically enlarged lymph nodes in the abdomen or pelvis. Reproductive: Status post hysterectomy, with no abnormal findings at the vaginal cuff. No adnexal mass. Other: No pneumoperitoneum. No ascites. Scattered fat stranding and ill-defined fluid in the midline ventral abdominal laparotomy site without measurable fluid collection. Musculoskeletal: No aggressive appearing focal osseous lesions. IMPRESSION: 1. 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 2. Small postoperative collection in the posterior right lower peritoneal cavity, slightly decreased. No new fluid collections. 3. Scattered fat stranding and ill-defined fluid in the midline ventral abdominal laparotomy site without measurable fluid collection. 4. Ovoid 1.1 cm hyperdense structure in the lower thoracic esophagus, cannot exclude a ingested foreign body, either retained oral contrast or pill fragment. 5. Subpleural 0.4 cm peripheral right lower lobe pulmonary nodule, decreased from 0.6 cm, favoring a benign etiology. 6.  Aortic Atherosclerosis (ICD10-I70.0). Electronically Signed   By: Delbert Phenix M.D.   On: 05/23/2023 11:37     LOS: 33 days   Signature  -    Susa Raring M.D on 05/25/2023 at 8:03 AM   -  To page go to  [email or web address]

## 2023-05-25 NOTE — Progress Notes (Signed)
Occupational Therapy Treatment Patient Details Name: Kathleen [name] MRN: [id number] DOB: 01/15/1946 Today's Date: 05/25/2023   History of present illness 77 y.o. 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 S/p exploratory laparotomy with sigmoid colectomy and end left colostomy by Dr. Dwain Sarna on 04/27/2023.  PMHx: colovaginal fistula associated with diverticulitis, obesity, OSA , BOOP, tracheobronchomalacia, chronic respiratory failure on 4 to 5 L oxygen at baseline, persistent A-fib S/P pacemaker implant 2017, chronic systolic heart failure, recent admission from 04/09/2023 to 04/13/2023.   OT comments  Pt in good spirits today, eager to participate. Pt displays good overall ability to complete transfers to [facility] and bed mobility with mod I to supervision for safety. Pt set up for hygiene, able to stand unsupported as needed and complete toileting hygiene and LB dressing. Pt completed LB bathing with set up sitting EOB. Pt displays overall good safety awareness and activity tolerance today. Will continue to follow acutely, DC plan to postacute rehab <3hrs/day still appropriate.      If plan is discharge home, recommend the following:  A little help with walking and/or transfers;A lot of help with bathing/dressing/bathroom;Assistance with cooking/housework;Assist for transportation;Help with stairs or ramp for entrance   Equipment Recommendations  None recommended by OT    Recommendations for Other Services      Precautions / Restrictions Precautions Precaution Comments: mod fall, multiple lines, colostomy Restrictions Weight Bearing Restrictions Per Provider Order: No       Mobility Bed Mobility Overal bed mobility: Needs Assistance Bed Mobility: Sit to Supine, Supine to Sit     Supine to sit: Supervision Sit to supine: Supervision   General bed mobility comments: supervision for assistance with lines    Transfers Overall transfer  level: Modified independent   Transfers: Sit to/from Stand, Bed to chair/wheelchair/BSC Sit to Stand: Modified independent (Device/Increase time)     Step pivot transfers: Supervision     General transfer comment: supervision for transfers     Balance Overall balance assessment: Needs assistance Sitting-balance support: Feet supported Sitting balance-Leahy Scale: Good Sitting balance - Comments: sitting EOB   Standing balance support: No upper extremity supported Standing balance-Leahy Scale: Fair Standing balance comment: able to stand unsupported and peform hygiene/dressing as needed, no LOB                           ADL either performed or assessed with clinical judgement   ADL Overall ADL's : Needs assistance/impaired Eating/Feeding: Independent;Sitting   Grooming: Wash/dry hands;Wash/dry face;Oral care;Sitting;Set up   Upper Body Bathing: Minimal assistance;Sitting   Lower Body Bathing: Set up;Sitting/lateral leans;Sit to/from stand   Upper Body Dressing : Set up;Sitting   Lower Body Dressing: Set up;Sit to/from stand   Toilet Transfer: Supervision/safety;Stand-pivot;BSC/3in1   Toileting- Architect and Hygiene: Set up;Supervision/safety         General ADL Comments: Pt completed toileting/LB bathing/dressing with set up/supervision. no LOB, able to stand and complete tasks unsupported as needed    Extremity/Trunk Assessment Upper Extremity Assessment Upper Extremity Assessment: Overall WFL for tasks assessed            Vision       Perception     Praxis      Cognition Arousal: Alert Behavior During Therapy: [facility] for tasks assessed/performed Overall Cognitive Status: Within Functional Limits for tasks assessed  Exercises      Shoulder Instructions       General Comments      Pertinent Vitals/ Pain       Pain Assessment Pain Assessment: No/denies  pain  Home Living                                          Prior Functioning/Environment              Frequency  Min 1X/week        Progress Toward Goals  OT Goals(current goals can now be found in the care plan section)  Progress towards OT goals: Progressing toward goals  Acute Rehab OT Goals Patient Stated Goal: to improve strength and independence with ADLs OT Goal Formulation: With patient Time For Goal Achievement: 06/04/23 Potential to Achieve Goals: Good ADL Goals Pt Will Perform Grooming: with supervision;standing Pt Will Perform Upper Body Bathing: with min assist;sitting Pt Will Perform Upper Body Dressing: with set-up;sitting Pt Will Transfer to Toilet: with supervision;ambulating;bedside commode Pt Will Perform Toileting - Clothing Manipulation and hygiene: with min assist;sit to/from stand Pt/caregiver will Perform Home Exercise Program: Increased strength;Both right and left upper extremity;With theraband;Independently Additional ADL Goal #1: Pt will complete bed mobility using log roll technique with min assist in preparation for ADLs. Additional ADL Goal #2: Pt will engage in 15 min functional activities without loss of sitting or standing balance and taking brief frequent rest breaks, sitting or standing to demonstrate pacing to avoid over-exhaustion, in order to show  improved activity tolerance and balance needed to perform ADLs safely at home.  Plan      Co-evaluation                 AM-PAC OT "6 Clicks" Daily Activity     Outcome Measure   Help from another person eating meals?: None Help from another person taking care of personal grooming?: A Little Help from another person toileting, which includes using toliet, bedpan, or urinal?: A Little Help from another person bathing (including washing, rinsing, drying)?: A Little Help from another person to put on and taking off regular upper body clothing?: A Little Help from  another person to put on and taking off regular lower body clothing?: A Little 6 Click Score: 19    End of Session Equipment Utilized During Treatment: Oxygen  OT Visit Diagnosis: Unsteadiness on feet (R26.81);Other abnormalities of gait and mobility (R26.89);Muscle weakness (generalized) (M62.81);Pain   Activity Tolerance Patient tolerated treatment well   Patient Left in bed;with call bell/phone within reach   Nurse Communication Mobility status        Time: 6578-4696 OT Time Calculation (min): 33 min  Charges: OT General Charges $OT Visit: 1 Visit OT Treatments $Self Care/Home Management : 23-37 mins  [place], OTR/L   Alexis Goodell 05/25/2023, 4:41 PM

## 2023-05-25 NOTE — Progress Notes (Addendum)
Addendum 05/25/2023 09:50 Received a secure chat from [employer], [employer] Would like to see how this patient does without TPN for a few days  Plan: Discontinue TPN and monitor. Provider will re-consult Pharmacy in the future if TPN is needed  Thank you Aracelli Woloszyn BS, PharmD, BCPS Clinical Pharmacist 05/25/2023 9:51 AM  Contact: [phone] after 3 PM  "Be curious, not judgmental..." -Debbora Dus  ----------------------------------------------------------------------------------------------------   PHARMACY - TOTAL PARENTERAL NUTRITION CONSULT NOTE  Indication: Prolonged ileus  Patient Measurements: Height: 5' (152.4 cm) Weight: 94.1 kg (207 lb 7.3 oz) IBW/kg (Calculated) : 45.5 TPN AdjBW (KG): 54 Body mass index is 40.52 kg/m. 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 Patient has been NPO since surgery. Pharmacy consulted to manage TPN.  Weights have been stable in the last year ~182-194 lbs. Patient doesn't eat much at home, only 2 meals a day. For breakfast she might eat grits, an egg and coffee. For dinner she cooks her own food (doesn't like the food at facility) and might include chicken, green beans, carrots and rice. She does drink chocolate Boost regularly and thinks Boost tastes better than Ensure. She does not like vanilla or berry flavored supplements. Unfortunately, we only have Boost in wild berry.  Glucose / Insulin: hx T2DM, A1c 6.2%; no meds PTA - CBG's 168 to 231. SSI reinitiated 12/14 . 9 units administered in the past 24 hours Electrolytes: low Na/CL (stable), K 3.8 (goal >/= 4), Mag 1.9 (goal >/= 2) CoCa back up to 11.04 (Ca x Phos = 45, goal < 55), others WNL Renal: SCr < 1, BUN  30s Hepatic: AST/ALT12/9 / tbili 0.5 / TG 145, albumin 2.7 Intake / Output; MIVF: no UOP charted (3 unmeasured) on torsemide and spironolactone since 12/5, JP drain 200 mL (Reglan restarted 12/12), Miralax increased to TID 12/14) LBM 12/16 GI Imaging: 11/21 KUB moderately dilated loops of small bowel, c/w post-operative ileus 11/23 KUB: persistent SBO or ileus, no significant change from prior 11/25 KUB: bowel obstruction or ileus, pleural effusion vs atelectasis 11/25 CTAP: post-op ileus or distal SBO, trace L pleural effusion 11/26 CXR: L > R lower lobe atelectasis 11/29 Abd XR: distention of SB concerning for obstruction or worsening postop ileus 12/1 CT: mild ileus 12/6 KUB: improved but not normalized ileus-like bowel gas pattern 12/8 CT:  consistent with ileus, mildly improved from the prior  12/11 KUB: no acute abnormality  12/15 CT:  small post-op collection posterior RL peritoneal cavity, slightly decreased. No new fluid collections GI Surgeries / Procedures:  11/19 ex lap with sigmoid colectomy and end left colostomy  Central access: PICC placed 04/30/23 TPN start date: 04/30/23  Nutritional Goals: Clinimix E 8/10 1L at 42 ml/hr with SMOF lipids on MTWThFSa Clinimix E 8/10 2L at 82 ml/hr on Sun with SMOFlipids - Average of 1258 kCals (~80% of goal) and 91g AA per day (100% goal) Previously on D10 fluids for add'l kCal >> stopped on 11/26 given concerns for pleural effusions/atelectasis noted on imaging, weights increased requiring Lasix IV - this has resolved so restarted D10 at 11/29, then changed to sodium bicarb D5 gtt   Electrolytes in Clinimix E 8/10 1L bag: Na 35 mEq, K 30 mEq, Mag 5 mEq, Ca 4.5 mEq, Phos  , Acetate 83, Cl 76 mEq  RD Estimated Needs Total Energy Estimated Needs: 1600-1800 kcal Total Protein Estimated Needs: 75-95 gm Total Fluid Estimated Needs: >1.6L  Current Nutrition:  TPN (1/2 TPN since 12/11) Ensure Enlive TID - 2 charted given  yesterday Prosource BID - 2 charted given yesterday Soft diet since 12/10: ate 25% of meals, improving.  Nausea and pain overnight and TRH change to NPO  Plan:  Clinimix E 8/10 at 42 mL/hr with no SMOF lipids to provide 660 kCal and 80g AA per day, meeting 41% of kCal and 100% of protein needs Received SMOFlipid daily from 11/22-12/11 - monitor for EFAD PO multivitamin daily (no MVI or trace elements in TPN) Continue sensitive SSI TIDACHS MagOx 400mg  PO BID per MD (started 12/10) Monitor TPN labs Mon/Thur (might need Clinimix without lytes 1-2 times per week pending Ca-Phos product) F/U oral intake and ability to wean off of TPN - hold off on increasing back to goal per Surgery  Greta Doom BS, PharmD, BCPS Clinical Pharmacist 05/25/2023 7:19 AM  Contact: [phone] after 3 PM  "Be curious, not judgmental..." -Debbora Dus

## 2023-05-25 NOTE — Progress Notes (Signed)
Physical Therapy Treatment Patient Details Name: Kathleen [name] MRN: [id number] DOB: 1945-09-19 Today's Date: 05/25/2023   History of Present Illness 77 y.o. 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 S/p exploratory laparotomy with sigmoid colectomy and end left colostomy by Dr. Dwain Sarna on 04/27/2023.  PMHx: colovaginal fistula associated with diverticulitis, obesity, OSA , BOOP, tracheobronchomalacia, chronic respiratory failure on 4 to 5 L oxygen at baseline, persistent A-fib S/P pacemaker implant 2017, chronic systolic heart failure, recent admission from 04/09/2023 to 04/13/2023.    PT Comments  Patient received in recliner, eating sandwich. Patient is agreeable to PT session. She requires increased time to get going due to lines/leads. Patient is supervision for sit to stand transfer from recliner and BSC. She ambulated 225 feet with rollator (4 wheeled walker) and supervision. Min A to manage lines. Patient is making good progress with PT. Goals updated. She will continue to benefit from skilled PT while here to improve endurance and strength.     If plan is discharge home, recommend the following: A little help with walking and/or transfers;A little help with bathing/dressing/bathroom;Assist for transportation   Can travel by private vehicle     Yes  Equipment Recommendations  None recommended by PT    Recommendations for Other Services       Precautions / Restrictions Precautions Precaution Comments: mod fall, multiple lines, colostomy Restrictions Weight Bearing Restrictions Per Provider Order: No     Mobility  Bed Mobility               General bed mobility comments: NT patient in recliner    Transfers Overall transfer level: Modified independent Equipment used: Rollator (4 wheels) Transfers: Sit to/from Stand Sit to Stand: Modified independent (Device/Increase time)                 Ambulation/Gait Ambulation/Gait assistance: Supervision Gait Distance (Feet): 225 Feet Assistive device: Rollator (4 wheels) Gait Pattern/deviations: Step-through pattern, Decreased stride length Gait velocity: decreased     General Gait Details: slow steady gait with cues for upright posture. Supervision for safety. Assistance for lines   Stairs             Wheelchair Mobility     Tilt Bed    Modified Rankin (Stroke Patients Only)       Balance Overall balance assessment: Modified Independent Sitting-balance support: Feet supported Sitting balance-Leahy Scale: Good     Standing balance support: Bilateral upper extremity supported, During functional activity, Reliant on assistive device for balance Standing balance-Leahy Scale: Fair Standing balance comment: with rollator support, no physical assist needed other than AD                            Cognition Arousal: Alert Behavior During Therapy: WFL for tasks assessed/performed Overall Cognitive Status: Within Functional Limits for tasks assessed                                          Exercises      General Comments        Pertinent Vitals/Pain Pain Assessment Pain Assessment: No/denies pain    Home Living                          Prior Function  PT Goals (current goals can now be found in the care plan section) Acute Rehab PT Goals Patient Stated Goal: leave here before Chirstmas PT Goal Formulation: With patient Time For Goal Achievement: 06/08/23 Potential to Achieve Goals: Good Progress towards PT goals: Progressing toward goals    Frequency    Min 1X/week      PT Plan      Co-evaluation              AM-PAC PT "6 Clicks" Mobility   Outcome Measure  Help needed turning from your back to your side while in a flat bed without using bedrails?: None Help needed moving from lying on your back to sitting on the side of a  flat bed without using bedrails?: A Little Help needed moving to and from a bed to a chair (including a wheelchair)?: A Little Help needed standing up from a chair using your arms (e.g., wheelchair or bedside chair)?: A Little Help needed to walk in hospital room?: A Little Help needed climbing 3-5 steps with a railing? : A Lot 6 Click Score: 18    End of Session Equipment Utilized During Treatment: Oxygen Activity Tolerance: Patient tolerated treatment well Patient left: in chair;with call bell/phone within reach Nurse Communication: Mobility status PT Visit Diagnosis: Muscle weakness (generalized) (M62.81)     Time: 1100-1133 PT Time Calculation (min) (ACUTE ONLY): 33 min  Charges:    $Gait Training: 8-22 mins $Therapeutic Activity: 8-22 mins PT General Charges $$ ACUTE PT VISIT: 1 Visit                     Cleota Pellerito, PT, GCS 05/25/23,11:49 AM

## 2023-05-25 NOTE — Progress Notes (Signed)
Mobility Specialist Progress Note;   05/25/23 0920  Mobility  Activity Transferred to/from [facility];Transferred from bed to chair  Level of Assistance Contact guard assist, steadying assist  Assistive Device Other (Comment) (HHA)  Distance Ambulated (ft) 3 ft  Activity Response Tolerated well  Mobility Referral Yes  Mobility visit 1 Mobility  Mobility Specialist Start Time (ACUTE ONLY) 0920  Mobility Specialist Stop Time (ACUTE ONLY) 0950  Mobility Specialist Time Calculation (min) (ACUTE ONLY) 30 min   Pt agreeable to mobility. Requested assistance to [facility] during session, void successful. Transferred pt from [facility] to chair via HHA requiring MinG assistance. No c/o during session. Pt left in chair with all needs met.   Caesar Bookman Mobility Specialist Please contact via SecureChat or [employer] [phone]

## 2023-05-26 DIAGNOSIS — E440 Moderate protein-calorie malnutrition: Secondary | ICD-10-CM | POA: Diagnosis not present

## 2023-05-26 DIAGNOSIS — R1031 Right lower quadrant pain: Secondary | ICD-10-CM

## 2023-05-26 DIAGNOSIS — K5720 Diverticulitis of large intestine with perforation and abscess without bleeding: Secondary | ICD-10-CM | POA: Diagnosis not present

## 2023-05-26 DIAGNOSIS — Z515 Encounter for palliative care: Secondary | ICD-10-CM | POA: Diagnosis not present

## 2023-05-26 LAB — CBC WITH DIFFERENTIAL/PLATELET
Abs Immature Granulocytes: 0.53 10*3/uL — ABNORMAL HIGH (ref 0.00–0.07)
Basophils Absolute: 0.1 10*3/uL (ref 0.0–0.1)
Basophils Relative: 1 %
Eosinophils Absolute: 0.6 10*3/uL — ABNORMAL HIGH (ref 0.0–0.5)
Eosinophils Relative: 5 %
HCT: 30 % — ABNORMAL LOW (ref 36.0–46.0)
Hemoglobin: 9 g/dL — ABNORMAL LOW (ref 12.0–15.0)
Immature Granulocytes: 5 %
Lymphocytes Relative: 11 %
Lymphs Abs: 1.2 10*3/uL (ref 0.7–4.0)
MCH: 25.5 pg — ABNORMAL LOW (ref 26.0–34.0)
MCHC: 30 g/dL (ref 30.0–36.0)
MCV: 85 fL (ref 80.0–100.0)
Monocytes Absolute: 0.8 10*3/uL (ref 0.1–1.0)
Monocytes Relative: 7 %
Neutro Abs: 7.7 10*3/uL (ref 1.7–7.7)
Neutrophils Relative %: 71 %
Platelets: 309 10*3/uL (ref 150–400)
RBC: 3.53 MIL/uL — ABNORMAL LOW (ref 3.87–5.11)
RDW: 16.5 % — ABNORMAL HIGH (ref 11.5–15.5)
WBC: 10.9 10*3/uL — ABNORMAL HIGH (ref 4.0–10.5)
nRBC: 0.3 % — ABNORMAL HIGH (ref 0.0–0.2)

## 2023-05-26 LAB — BASIC METABOLIC PANEL
Anion gap: 11 (ref 5–15)
BUN: 23 mg/dL (ref 8–23)
CO2: 25 mmol/L (ref 22–32)
Calcium: 10.3 mg/dL (ref 8.9–10.3)
Chloride: 100 mmol/L (ref 98–111)
Creatinine, Ser: 0.57 mg/dL (ref 0.44–1.00)
GFR, Estimated: >60 mL/min (ref >60)
Glucose, Bld: 136 mg/dL — ABNORMAL HIGH (ref 70–99)
Potassium: 4.8 mmol/L (ref 3.5–5.1)
Sodium: 136 mmol/L (ref 135–145)

## 2023-05-26 LAB — GLUCOSE, CAPILLARY
Glucose-Capillary: 111 mg/dL — ABNORMAL HIGH (ref 70–99)
Glucose-Capillary: 167 mg/dL — ABNORMAL HIGH (ref 70–99)
Glucose-Capillary: 134 mg/dL — ABNORMAL HIGH (ref 70–99)
Glucose-Capillary: 162 mg/dL — ABNORMAL HIGH (ref 70–99)

## 2023-05-26 LAB — UREA NITROGEN, URINE: Urea Nitrogen, Ur: 918 mg/dL

## 2023-05-26 LAB — BRAIN NATRIURETIC PEPTIDE: B Natriuretic Peptide: 198.9 pg/mL — ABNORMAL HIGH (ref 0.0–100.0)

## 2023-05-26 LAB — C-REACTIVE PROTEIN: CRP: 1.1 mg/dL — ABNORMAL HIGH (ref <1.0)

## 2023-05-26 LAB — PROCALCITONIN: Procalcitonin: <0.1 ng/mL

## 2023-05-26 LAB — MAGNESIUM: Magnesium: 1.9 mg/dL (ref 1.7–2.4)

## 2023-05-26 LAB — PHOSPHORUS: Phosphorus: 3.8 mg/dL (ref 2.5–4.6)

## 2023-05-26 MED ORDER — POLYETHYLENE GLYCOL 3350 17 G PO PACK
17.0000 g | PACK | Freq: Every day | ORAL | Status: DC
  Administered 2023-05-27: 17 g via ORAL
  Filled 2023-05-26: qty 1

## 2023-05-26 MED ORDER — METHOCARBAMOL 500 MG PO TABS
1000.0000 mg | ORAL_TABLET | Freq: Four times a day (QID) | ORAL | Status: DC
  Administered 2023-05-26: 750 mg via ORAL
  Administered 2023-05-26 – 2023-05-27 (×3): 1000 mg via ORAL
  Filled 2023-05-26 (×4): qty 2

## 2023-05-26 MED ORDER — HYDROMORPHONE HCL 1 MG/ML IJ SOLN
0.5000 mg | INTRAMUSCULAR | Status: DC | PRN
  Administered 2023-05-26 (×4): 0.5 mg via INTRAVENOUS
  Filled 2023-05-26 (×4): qty 0.5

## 2023-05-26 MED ORDER — METOCLOPRAMIDE HCL 10 MG PO TABS
5.0000 mg | ORAL_TABLET | Freq: Three times a day (TID) | ORAL | Status: DC
  Administered 2023-05-26 – 2023-05-27 (×3): 5 mg via ORAL
  Filled 2023-05-26 (×3): qty 1

## 2023-05-26 NOTE — Progress Notes (Signed)
PROGRESS NOTE        PATIENT DETAILS Name: Kathleen [name] Age: 77 y.o. Sex: female Date of Birth: 12/21/1945 Admit Date: 04/22/2023 Admitting Physician Gery Pray, MD JXB:JYNWG, Bernadene Bell, MD  Brief Summary:  Patient is a 77 y.o.  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  Admitted by TRH-started on empiric IV antibiotics-underwent preop assessment by cardiology-and then subsequently underwent  exploratory laparotomy with sigmoid colectomy and end ostomy on 11/19.  Postoperative course complicated by ileus-requiring NG tube decompression-and TNA for nutrition.  See below for further details.  Significant events: 11/14>> admit to [facility] 11/19>>exploratory laparotomy with sigmoid colectomy and end ostomy  12/04>> NG removed-clear liquid started  Significant studies: 11/14>> CT abdomen/pelvis: Worsening signal right diverticulitis-localized perforation-5.6 cm perirectal abscess, 6.0 cm left lower quadrant abscess. 11/22>> x-ray abdomen: Consistent with postoperative ileus. 11/25>> CT abdomen/pelvis: No abscess 12/01>> CT abdomen/pelvis: Mild small bowel ileus-no obvious abscess-small fluid collection in the colonic stump  Significant microbiology data: 11/14>> blood culture: No growth 11/14>> COVID/influenza/RSV PCR: Negative  Procedures:  11/19>>exploratory laparotomy with sigmoid colectomy  end ostomy on 11/19 12/15 >> CT -  1. 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 2. Small postoperative collection in the posterior right lower peritoneal cavity, slightly decreased. No new fluid collections. 3. Scattered fat stranding and ill-defined fluid in the midline ventral abdominal  laparotomy site without measurable fluid collection. 4. Ovoid 1.1 cm hyperdense structure in the lower thoracic esophagus, cannot exclude a ingested foreign body, either retained oral contrast or pill fragment. 5. Subpleural 0.4 cm peripheral right lower lobe pulmonary nodule, decreased from 0.6 cm, favoring a benign etiology. 6.  Aortic Atherosclerosis (ICD10-I70.0).   EGD 05/24/23  Impression:               - Esophageal plaques were found, consistent with                            candidiasis.                           - No evidence of esophageal foreign body.                           - Normal stomach.                           - Normal examined duodenum.                           - No specimens collected. Moderate Sedation:      N/A Recommendation:           - Patient has a contact number available for                            emergencies. The signs and symptoms of potential  delayed complications were discussed with the                            patient. Return to normal activities tomorrow.                            Written discharge instructions were provided to the                            patient.                           - Resume previous diet.                           - Continue present medications.                           - Fluconazole 400 mg x 1 day, 200 mg x 13 days for                            Candida esophagitis.                           - GI inpatient service will sign off, call if                            questions.   Consults: Cardiology GI General surgery  Subjective:  Patient in bed, appears comfortable, denies any headache, no fever, no chest pain or pressure, no shortness of breath , no abdominal pain, still has mild nausea. No new focal weakness.   Objective: Vitals: Blood pressure (!) 113/54, pulse 70, temperature 97.7 F (36.5 C), temperature source Oral, resp. rate 17, height 5' (1.524 m), weight 84.7 kg, SpO2  100%.   Exam:  Awake Alert, No new F.N deficits, Normal affect Ashley.AT,PERRAL Supple Neck, No JVD,   Symmetrical Chest wall movement, Good air movement bilaterally, CTAB RRR,No Gallops, Rubs or new Murmurs,  +ve B.Sounds, Abd Soft but mildly distended, mild generalized tenderness, colostomy bag with some dark stool in it, pannus cellulitis Trace edema  Assessment/Plan:  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 with localized perforation and pelvic abscess-s/p ex lap with colectomy/ostomy on 11/19-complicated by postoperative ileus requiring NG tube decompression  Overall had improved-NG tube discontinued 12/4, TNA held by general surgery on 05/25/2019 for trial of oral diet, continue to monitor add protein supplements as well, finished her antibiotics which was Zosyn on 05/18/2023.    Surgery following, JP drain and colostomy in place, continues to have intermittent nausea, CT repeated on 05/23/2023 and case discussed with general surgeon Dr. Cliffton Asters, continue supportive care, CT scan does show questionable retained foreign body in the esophagus.  GI consulted, see below.   Esophageal candidiasis likely contributing to her nausea.  EGD done by GI on 05/24/2023 suggestive of esophageal candidiasis.  Oral nystatin as recommended by GI and follow-up.  Nausea mildly improved but still is nauseated continue to monitor.    Hypokalemia/hypophosphatemia/hypomagnesemia Replete per pharmacy TNA protocol.  Normocytic anemia Secondary to critical illness No evidence of blood loss Transfused 1 additional unit of PRBC on  12/10 Continue to follow CBC periodically.  Persistent atrial fibrillation Telemetry monitoring Tikosyn orally-attempt to keep K> 4 Initially on IV heparin-has been switched to Xarelto  History of AV nodal ablation-s/p PPM ([employer]) Telemetry monitoring  Chronic HFrEF (EF 35-40% on 4/9 by TTE) Volume status reasonably stable but some leg edema    Continue on Lasix/Aldactone   Mild hyponatremia.  On diuretics, urine sodium less than 10 despite being on diuretics suggesting dehydration, skip diuretics for 2 days starting 05/25/2023 and gently hydrate for 10 hours.  Pannus cellulitis.  Nystatin powder and monitor.    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  GERD PPI  DM-2 (A1c 6.2 on 11/15) CBGs stable on SSI  CBG (last 3)  Recent Labs    05/25/23 1650 05/25/23 2058 05/26/23 0754  GLUCAP 173* 150* 111*    Nutrition Status: Nutrition Problem: Moderate Malnutrition Etiology: chronic illness Signs/Symptoms: mild fat depletion, mild muscle depletion, energy intake < 75% for > 7 days Interventions: Ensure Enlive (each supplement provides 350kcal and 20 grams of protein), Magic cup, MVI  Obesity: Estimated body mass index is 36.47 kg/m as calculated from the following:   Height as of this encounter: 5' (1.524 m).   Weight as of this encounter: 84.7 kg.   Code status:   Code Status: Limited: Do not attempt resuscitation (DNR) -DNR-LIMITED -Do Not Intubate/DNI    DVT Prophylaxis: IV heparinrivaroxaban (XARELTO) tablet 20 mg    Family Communication: None at bedside.   Disposition Plan: Status is: Inpatient Remains inpatient appropriate because: Severity of illness   Planned Discharge Destination:Skilled nursing facility   Diet: Diet Order             Diet regular Room service appropriate? Yes; Fluid consistency: Thin  Diet effective now                   MEDICATIONS: Scheduled Meds:  (feeding supplement) PROSource Plus  30 mL Oral BID BM   sodium chloride   Intravenous Once   acetaminophen  650 mg Oral Q6H   Chlorhexidine Gluconate Cloth  6 each Topical Daily   diclofenac Sodium  2 g Topical BID    docusate sodium  100 mg Oral BID   dofetilide  250 mcg Oral Q12H   feeding supplement  237 mL Oral TID BM   fluconazole  200 mg Oral Daily   fluticasone furoate-vilanterol  1 puff Inhalation Daily   gabapentin  200 mg Oral QHS   insulin aspart  0-5 Units Subcutaneous QHS   insulin aspart  0-9 Units Subcutaneous TID WC   lidocaine  1 patch Transdermal Q24H   magic mouthwash  5 mL Oral QID   magnesium oxide  400 mg Oral BID   methocarbamol  750 mg Oral Q6H   multivitamin with minerals  1 tablet Oral Daily   nystatin   Topical TID   mouth rinse  15 mL Mouth Rinse 4 times per day   pantoprazole  40 mg Oral BID   polyethylene glycol  17 g Oral TID   revefenacin  175 mcg Nebulization Daily   rivaroxaban  20 mg Oral Q supper   [START ON 05/27/2023] spironolactone  25 mg Oral Daily   [START ON 05/27/2023] torsemide  40 mg Oral Daily   Continuous Infusions:   PRN Meds:.albuterol, guaiFENesin-dextromethorphan, HYDROmorphone (DILAUDID) injection, metoCLOPramide (REGLAN)  injection, ondansetron (ZOFRAN) IV, oxyCODONE, sodium chloride flush   I have personally reviewed following labs and imaging studies  LABORATORY DATA:  Recent Labs  Lab 05/20/23 0259 05/23/23 0426 05/24/23 0322 05/25/23 0337 05/26/23 0318  WBC 8.1 9.9 10.7* 13.2* 10.9*  HGB 8.3* 9.2* 9.3* 9.0* 9.0*  HCT 26.6* 29.4* 30.2* 29.2* 30.0*  PLT 217 272 296 285 309  MCV 83.6 82.8 83.2 83.4 85.0  MCH 26.1 25.9* 25.6* 25.7* 25.5*  MCHC 31.2 31.3 30.8 30.8 30.0  RDW 17.0* 16.6* 16.4* 15.9* 16.5*  LYMPHSABS  --   --  1.1 0.9 1.2  MONOABS  --   --  0.9 0.7 0.8  EOSABS  --   --  0.8* 0.0 0.6*  BASOSABS  --   --  0.1 0.1 0.1    Recent Labs  Lab 05/20/23 0259 05/21/23 0313 05/22/23 0449 05/23/23 0426 05/24/23 0322 05/24/23 0524 05/26/23 0318  NA 134* 131* 129* 132* 130*  --  136  K 3.4* 3.9 4.1 3.8 3.8  --  4.8  CL 97* 94* 95* 91* 93*  --  100  CO2 29 29 28 27 28   --  25  ANIONGAP 8 8 6 14 9   --  11  GLUCOSE  179* 213* 193* 206* 214*  --  136*  BUN 25* 28* 36* 37* 37*  --  23  CREATININE 0.54 0.54 0.57 0.62 0.65  --  0.57  AST 11*  --   --   --  12*  --   --   ALT 8  --   --   --  9  --   --   ALKPHOS 51  --   --   --  57  --   --   BILITOT 0.7  --   --   --  0.5  --   --   ALBUMIN 2.4*  --   --   --  2.7*  --   --   CRP  --   --   --   --   --  1.3* 1.1*  PROCALCITON  --   --   --   --   --  <0.10 <0.10  BNP  --   --   --   --   --   --  198.9*  MG 1.7 1.7 2.1  --  1.9  --  1.9  CALCIUM 10.3 9.9 9.3 10.1 10.0  --  10.3    Lab Results  Component Value Date   CHOL 174 08/07/2021   HDL 35.00 (L) 08/07/2021   LDLCALC 99 08/07/2021   TRIG 145 05/24/2023   CHOLHDL 5 08/07/2021      Recent Labs  Lab 05/20/23 0259 05/21/23 0313 05/22/23 0449 05/23/23 0426 05/24/23 0322 05/24/23 0524 05/26/23 0318  CRP  --   --   --   --   --  1.3* 1.1*  PROCALCITON  --   --   --   --   --  <0.10 <0.10  BNP  --   --   --   --   --   --  198.9*  MG 1.7 1.7 2.1  --  1.9  --  1.9  CALCIUM 10.3 9.9 9.3 10.1 10.0  --  10.3    RADIOLOGY STUDIES/RESULTS: DG Chest Port 1 View Result Date: 05/25/2023 CLINICAL DATA:  Short of breath, cough EXAM: PORTABLE CHEST 1 VIEW COMPARISON:  05/04/2023, 05/23/2023 FINDINGS: Single frontal view of  the chest demonstrates stable multi lead pacer. Right-sided PICC tip overlies superior vena cava. The cardiac silhouette is enlarged but stable. Chronic atelectasis at the left lung base. Stable left pleural thickening based on prior CT exam. No airspace disease, effusion, or pneumothorax. IMPRESSION: 1. Stable left basilar atelectasis or scarring and chronic left pleural thickening. 2. No acute intrathoracic process. Electronically Signed   By: Sharlet Salina M.D.   On: 05/25/2023 09:27     LOS: 34 days   Signature  -    Susa Raring M.D on 05/26/2023 at 8:24 AM   -  To page go to [email or web address]

## 2023-05-26 NOTE — Progress Notes (Signed)
Physical Therapy Treatment Patient Details Name: Kathleen [name] MRN: [id number] DOB: [birth year] Today's Date: 05/26/2023   History of Present Illness 77 y.o. 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 S/p exploratory laparotomy with sigmoid colectomy and end left colostomy by Dr. Dwain Sarna on 04/27/2023.  PMHx: colovaginal fistula associated with diverticulitis, obesity, OSA , BOOP, tracheobronchomalacia, chronic respiratory failure on 4 to 5 L oxygen at baseline, persistent A-fib S/P pacemaker implant 2017, chronic systolic heart failure, recent admission from 04/09/2023 to 04/13/2023.    PT Comments  Pt received sitting in the recliner and agreeable to session. Pt appears in better spirits this session and reports improved abdominal pain. Pt's HR noted to elevate up to 203 when first standing, however improves to 70's with lead adjustment so unsure of accuracy, RN notified. Pt able to tolerate increased gait distance with improved upright posture this session, however continues to demonstrate DOE and is limited by fatigue. Pt continues to benefit from PT services to progress toward functional mobility goals.     If plan is discharge home, recommend the following: A little help with walking and/or transfers;A little help with bathing/dressing/bathroom;Assist for transportation   Can travel by private vehicle     Yes  Equipment Recommendations  None recommended by PT    Recommendations for Other Services       Precautions / Restrictions Precautions Precautions: Fall Precaution Comments: mod fall, 2 jp drains, colostomy Restrictions Weight Bearing Restrictions Per Provider Order: No     Mobility  Bed Mobility               General bed mobility comments: pt in recliner at beginning and end of session    Transfers Overall transfer level: Needs assistance Equipment used: Rollator (4 wheels) Transfers: Sit to/from Stand, Bed to  chair/wheelchair/BSC Sit to Stand: Modified independent (Device/Increase time)   Step pivot transfers: Supervision            Ambulation/Gait Ambulation/Gait assistance: Supervision Gait Distance (Feet): 275 Feet Assistive device: Rollator (4 wheels) Gait Pattern/deviations: Step-through pattern, Decreased stride length       General Gait Details: slow steady gait with improved upright posture upright posture. Supervision for safety. increased DOE with progressed distance       Balance Overall balance assessment: Needs assistance Sitting-balance support: Feet supported Sitting balance-Leahy Scale: Good Sitting balance - Comments: sitting in recliner   Standing balance support: Bilateral upper extremity supported, During functional activity Standing balance-Leahy Scale: Fair Standing balance comment: with rollator support                            Cognition Arousal: Alert Behavior During Therapy: WFL for tasks assessed/performed Overall Cognitive Status: Within Functional Limits for tasks assessed                                 General Comments: some decreased short term memory noted with pt repeating things during session        Exercises      General Comments General comments (skin integrity, edema, etc.): Pt's HR noted to increase up to 203 when initially standing and pt without any symptoms. Pt instructed to take a seated rest break and leads adjusted. HR down to 70's during rest and up to 90's with ambulation. RN notified      Pertinent Vitals/Pain Pain Assessment Pain Assessment: Faces  Faces Pain Scale: Hurts a little bit Pain Location: abdomen Pain Descriptors / Indicators: Grimacing, Guarding, Aching Pain Intervention(s): Monitored during session     PT Goals (current goals can now be found in the care plan section) Acute Rehab PT Goals Patient Stated Goal: leave here before Chirstmas PT Goal Formulation: With  patient Time For Goal Achievement: 06/08/23 Progress towards PT goals: Progressing toward goals    Frequency    Min 1X/week       AM-PAC PT "6 Clicks" Mobility   Outcome Measure  Help needed turning from your back to your side while in a flat bed without using bedrails?: None Help needed moving from lying on your back to sitting on the side of a flat bed without using bedrails?: A Little Help needed moving to and from a bed to a chair (including a wheelchair)?: A Little Help needed standing up from a chair using your arms (e.g., wheelchair or bedside chair)?: A Little Help needed to walk in hospital room?: A Little Help needed climbing 3-5 steps with a railing? : A Lot 6 Click Score: 18    End of Session Equipment Utilized During Treatment: Oxygen Activity Tolerance: Patient tolerated treatment well Patient left: in chair;with call bell/phone within reach;with chair alarm set Nurse Communication: Mobility status PT Visit Diagnosis: Muscle weakness (generalized) (M62.81)     Time: 1610-9604 PT Time Calculation (min) (ACUTE ONLY): 31 min  Charges:    $Gait Training: 23-37 mins PT General Charges $$ ACUTE PT VISIT: 1 Visit                     Kathleen [name], PTA Acute Rehabilitation Services Secure Chat Preferred  [phone]    Kathleen [name] 05/26/2023, 12:23 PM

## 2023-05-26 NOTE — Plan of Care (Signed)
  Problem: Education: Goal: Ability to describe self-care measures that may prevent or decrease complications (Diabetes Survival Skills Education) will improve Outcome: Progressing Goal: Individualized Educational Video(s) Outcome: Progressing   Problem: Coping: Goal: Ability to adjust to condition or change in health will improve Outcome: Progressing   Problem: Fluid Volume: Goal: Ability to maintain a balanced intake and output will improve Outcome: Progressing   Problem: Health Behavior/Discharge Planning: Goal: Ability to identify and utilize available resources and services will improve Outcome: Progressing Goal: Ability to manage health-related needs will improve Outcome: Progressing   Problem: Metabolic: Goal: Ability to maintain appropriate glucose levels will improve Outcome: Progressing   Problem: Nutritional: Goal: Maintenance of adequate nutrition will improve Outcome: Progressing Goal: Progress toward achieving an optimal weight will improve Outcome: Progressing   Problem: Skin Integrity: Goal: Risk for impaired skin integrity will decrease Outcome: Progressing   Problem: Tissue Perfusion: Goal: Adequacy of tissue perfusion will improve Outcome: Progressing   Problem: Education: Goal: Knowledge of General Education information will improve Description: Including pain rating scale, medication(s)/side effects and non-pharmacologic comfort measures Outcome: Progressing   Problem: Health Behavior/Discharge Planning: Goal: Ability to manage health-related needs will improve Outcome: Progressing   Problem: Clinical Measurements: Goal: Ability to maintain clinical measurements within normal limits will improve Outcome: Progressing Goal: Will remain free from infection Outcome: Progressing Goal: Diagnostic test results will improve Outcome: Progressing Goal: Cardiovascular complication will be avoided Outcome: Progressing   Problem: Activity: Goal: Risk  for activity intolerance will decrease Outcome: Progressing   Problem: Nutrition: Goal: Adequate nutrition will be maintained Outcome: Progressing   Problem: Coping: Goal: Level of anxiety will decrease Outcome: Progressing   Problem: Elimination: Goal: Will not experience complications related to bowel motility Outcome: Progressing Goal: Will not experience complications related to urinary retention Outcome: Progressing   Problem: Pain Management: Goal: General experience of comfort will improve Outcome: Progressing   Problem: Safety: Goal: Ability to remain free from injury will improve Outcome: Progressing   Problem: Skin Integrity: Goal: Risk for impaired skin integrity will decrease Outcome: Progressing   Problem: Education: Goal: Understanding of discharge needs will improve Outcome: Progressing Goal: Verbalization of understanding of the causes of altered bowel function will improve Outcome: Progressing   Problem: Activity: Goal: Ability to tolerate increased activity will improve Outcome: Progressing   Problem: Bowel/Gastric: Goal: Gastrointestinal status for postoperative course will improve Outcome: Progressing   Problem: Health Behavior/Discharge Planning: Goal: Identification of community resources to assist with postoperative recovery needs will improve Outcome: Progressing   Problem: Nutritional: Goal: Will attain and maintain optimal nutritional status will improve Outcome: Progressing   Problem: Clinical Measurements: Goal: Postoperative complications will be avoided or minimized Outcome: Progressing   Problem: Respiratory: Goal: Respiratory status will improve Outcome: Progressing   Problem: Skin Integrity: Goal: Will show signs of wound healing Outcome: Progressing

## 2023-05-26 NOTE — Progress Notes (Signed)
1700 Late entry Bed bath given per CNA.  Redness noted in bilateral thigh folds and groin area.  Nystatin powder applied as ordered.  Wound care complete to abdomen.  Abdomen open with 2 visible other open areas and serous drainage noted.  Cleaned and wet to dry 0.9 NS dressing applied, ABD placed and secured to tape.  JP drain 1 with 7.5 ml out and JP drain 2 with 10 ml of cloudy milky drainage removed.  Bulbs placed back to suction.  Assisted to the bedside commode without difficulty.

## 2023-05-26 NOTE — Progress Notes (Addendum)
Progress Note  2 Days Post-Op  Subjective: Long discussion with patient about overall pathology and operative intervention. She was concerned that someone came in yesterday and told her she was discharging home. She lives in an independent living facility and does not feel that she'll be able to care for herself. She reports there is not room for her at either of her son's homes and her daughter lives in a small apartment in [place]. She is able to walk to the bathroom but feels very nervous about wound care given that she is not able to see her abdomen well. She also has very reasonable concerns about being able to fix meals for herself, she could not stand and cook for an extended period and I would advise against lifting >10 lbs until abdominal incision is more healed. Her facility can provide one meal a day but to have it delivered to her apartment is an additional cost. No family at bedside this AM.   Also discussed pain control. She is complaining of more right sided abdominal pain. Has been taking primarily IV pain meds for the last several days and reported that previously the oxycodone did not provide any relief. She is willing to try again today and see if there is more effect now that her GI tract is working more. She does report persistent nausea but no vomiting, is taking both reglan and zofran as needed.   Objective: Vital signs in last 24 hours: Temp:  [97.4 F (36.3 C)-98 F (36.7 C)] 97.7 F (36.5 C) (12/18 0755) Pulse Rate:  [69-73] 70 (12/18 0755) Resp:  [17-21] 17 (12/18 0755) BP: (113-136)/(54-80) 113/54 (12/18 0755) SpO2:  [97 %-100 %] 100 % (12/18 0334) Weight:  [84.7 kg] 84.7 kg (12/18 0500) Last BM Date : 05/25/23  Intake/Output from previous day: 12/17 0701 - 12/18 0700 In: 480 [P.O.:480] Out: 550 [Stool:550] Intake/Output this shift: No intake/output data recorded.  PE: Gen:  Alert, NAD Pulm:  rate and effort normal on supplemental O2 via [place] Abd: soft,  obese, not significantly distended Stoma viable with soft brown stool in bag.  JP#1 and #2 scant slightly cloudy fluid. Midline wound clean with some dehiscence superiorly but no visible bowel    Lab Results:  Recent Labs    05/25/23 0337 05/26/23 0318  WBC 13.2* 10.9*  HGB 9.0* 9.0*  HCT 29.2* 30.0*  PLT 285 309   BMET Recent Labs    05/24/23 0322 05/26/23 0318  NA 130* 136  K 3.8 4.8  CL 93* 100  CO2 28 25  GLUCOSE 214* 136*  BUN 37* 23  CREATININE 0.65 0.57  CALCIUM 10.0 10.3   PT/INR No results for input(s): "LABPROT", "INR" in the last 72 hours. CMP     Component Value Date/Time   NA 136 05/26/2023 0318   NA 130 (L) 12/28/2012 0748   K 4.8 05/26/2023 0318   K 4.6 12/28/2012 0748   CL 100 05/26/2023 0318   CL 95 (L) 12/28/2012 0748   CO2 25 05/26/2023 0318   CO2 23 12/28/2012 0748   GLUCOSE 136 (H) 05/26/2023 0318   GLUCOSE 391 (H) 12/28/2012 0748   BUN 23 05/26/2023 0318   BUN 32 (H) 12/28/2012 0748   CREATININE 0.57 05/26/2023 0318   CREATININE 0.86 01/27/2023 1137   CALCIUM 10.3 05/26/2023 0318   CALCIUM 14.3 (HH) 06/26/2019 1618   PROT 5.8 (L) 05/24/2023 0322   ALBUMIN 2.7 (L) 05/24/2023 0322   AST 12 (L) 05/24/2023  0322   ALT 9 05/24/2023 0322   ALKPHOS 57 05/24/2023 0322   BILITOT 0.5 05/24/2023 0322   GFRNONAA >60 05/26/2023 0318   GFRNONAA 43 (L) 12/28/2012 0748   GFRAA 58 (L) 06/28/2019 0427   GFRAA 50 (L) 12/28/2012 0748   Lipase     Component Value Date/Time   LIPASE 30 04/22/2023 1840       Studies/Results: DG Chest Port 1 View Result Date: 05/25/2023 CLINICAL DATA:  Short of breath, cough EXAM: PORTABLE CHEST 1 VIEW COMPARISON:  05/04/2023, 05/23/2023 FINDINGS: Single frontal view of the chest demonstrates stable multi lead pacer. Right-sided PICC tip overlies superior vena cava. The cardiac silhouette is enlarged but stable. Chronic atelectasis at the left lung base. Stable left pleural thickening based on prior CT exam. No  airspace disease, effusion, or pneumothorax. IMPRESSION: 1. Stable left basilar atelectasis or scarring and chronic left pleural thickening. 2. No acute intrathoracic process. Electronically Signed   By: Sharlet Salina M.D.   On: 05/25/2023 09:27    Anti-infectives: Anti-infectives (From admission, onward)    Start     Dose/Rate Route Frequency Ordered Stop   05/25/23 1000  fluconazole (DIFLUCAN) tablet 200 mg        200 mg Oral Daily 05/24/23 1106 06/07/23 0959   05/24/23 1115  fluconazole (DIFLUCAN) IVPB 400 mg        400 mg 100 mL/hr over 120 Minutes Intravenous Every 24 hours 05/24/23 1106 05/24/23 1502   05/10/23 1000  piperacillin-tazobactam (ZOSYN) IVPB 3.375 g  Status:  Discontinued        3.375 g 12.5 mL/hr over 240 Minutes Intravenous Every 8 hours 05/10/23 0914 05/17/23 1317   05/03/23 1400  piperacillin-tazobactam (ZOSYN) IVPB 3.375 g        3.375 g 12.5 mL/hr over 240 Minutes Intravenous Every 8 hours 05/03/23 0935 05/07/23 0124   04/27/23 1021  sodium chloride 0.9 % with cefoTEtan (CEFOTAN) ADS Med       Note to Pharmacy: Shanda Bumps M: cabinet override      04/27/23 1021 04/27/23 1253   04/26/23 0800  cefoTEtan (CEFOTAN) 2 g in sodium chloride 0.9 % 100 mL IVPB  Status:  Discontinued        2 g 200 mL/hr over 30 Minutes Intravenous To [facility] 04/23/23 2111 04/27/23 0800   04/22/23 2300  piperacillin-tazobactam (ZOSYN) IVPB 3.375 g        3.375 g 12.5 mL/hr over 240 Minutes Intravenous Every 8 hours 04/22/23 2256 05/02/23 2359   04/22/23 1900  piperacillin-tazobactam (ZOSYN) IVPB 3.375 g        3.375 g 100 mL/hr over 30 Minutes Intravenous  Once 04/22/23 1852 04/22/23 1954        Assessment/Plan  POD27 S/p Exploratory laparotomy, sigmoid colectomy, colostomy 11/19/24Dwain Sarna - CT 12/8 fairly unchanged with mild ileus and small 3.8x2.2cm fluid collection near colonic stump.  JP drain x2 previously purulent, they are serous today and minimal output.  Continue drains for now  - CT 12/15 with slight decrease in RLQ collection, no new collections - WBC 10, afebrile. Off antibiotics. Monitor  - Continues to have some abdominal pain. Ostomy output much looser - decrease miralax to daily. - working on transitioning to PO pain control - change reglan to scheduled PO - WOC following for new ostomy. Stoma with some mucocutaneous separation, monitor. Will need ostomy clinic referral at discharge from SNF/LTACH - BID WTD to midline wound  - Nystatin powder for skin folds,  ok to use interdry also  - oob, pulm toilet - will need follow up with Dr. Dwain Sarna once discharged from [facility], can call to schedule   ID zosyn 11/14>11/29, 12/2>12/9 VTE - xarelto for afib  FEN - off TPN, reg diet, Ensure   LOS: 34 days    Juliet Rude, [facility] Surgery 05/26/2023, 10:48 AM Please see Amion for pager number during day hours 7:00am-4:30pm

## 2023-05-26 NOTE — Progress Notes (Signed)
Patient ID: ISSIS HASSING, female   DOB: March 07, 1946, 77 y.o.   MRN: [id number]    Progress Note from the Palliative Medicine Team at [facility]   Patient Name: Kathleen [name]        Date: 05/26/2023 DOB: 1945-08-22  Age: 77 y.o. MRN#: [id number] Attending Physician: Leroy Sea, MD Primary Care Physician: [name], MD Admit Date: 04/22/2023   Reason for Consultation/Follow-up   Establishing Goals of Care   HPI/ Brief Hospital Review 77 y.o.  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  Admitted by [facility]-started on empiric IV antibiotics-underwent preop assessment by cardiology-and then subsequently underwent  exploratory laparotomy with sigmoid colectomy and end ostomy on 11/19.  Postoperative course complicated by ileus-requiring NG tube decompression-and TNA for nutrition.  See below for further details.   ecurrent sigmoid diverticulitis associated with colovaginal fistula with localized perforation and pelvic abscess-s/p ex lap with colectomy/ostomy on 11/19-complicated by postoperative ileus requiring NG tube decompression   Overall had improved-NG tube discontinued 12/4, TNA held by general surgery on 05/25/2019 for trial of oral diet, continue to monitor add protein supplements as well, finished her antibiotics which was Zosyn on 05/18/2023.     Surgery following, JP drain and colostomy in place, continues to have intermittent nausea, CT repeated on 05/23/2023 and case discussed with general surgeon Dr. Cliffton Asters, continue supportive care, CT scan does show questionable retained foreign body in the esophagus.  GI consulted, see below.  ecurrent sigmoid diverticulitis associated with colovaginal fistula with localized perforation and pelvic abscess-s/p ex lap with  colectomy/ostomy on 11/19-complicated by postoperative ileus requiring NG tube decompression   Overall had improved-NG tube discontinued 12/4, TNA held by general surgery on 05/25/2019 for trial of oral diet, continue to monitor add protein supplements as well, finished her antibiotics which was Zosyn on 05/18/2023.     Surgery following, JP drain and colostomy in place, continues to have intermittent nausea, CT repeated on 05/23/2023 and case discussed with general surgeon Dr. Cliffton Asters, continue supportive care, CT scan does show questionable retained foreign body in the esophagus.  GI consulted, see below.    Subjective  Extensive chart review has been completed prior to meeting with patient/family  including labs, vital signs, imaging, progress/consult notes, orders, medications and available advance directive documents.    This NP assessed patient at the bedside as a follow up to  yesterday's GOCs meeting.        Education offered today regarding  the importance of continued conversation with family and their  medical providers regarding overall plan of care and treatment options,  ensuring decisions are within the context of the patients values and GOCs.  Questions and concerns addressed   Discussed with primary team and nursing staff   Time:    65 minutes  Detailed review of medical records ( labs, imaging, vital signs), medically appropriate exam ( MS, skin, cardiac,  resp)   discussed with treatment team, counseling and education to patient, family, staff, documenting clinical information, medication management, coordination of care    Lorinda Creed NP  Palliative Medicine Team Team Phone # [phone] Pager [phone]

## 2023-05-26 NOTE — Progress Notes (Signed)
Mobility Specialist Progress Note;   05/26/23 1450  Mobility  Activity Ambulated with assistance in hallway  Level of Assistance Standby assist, set-up cues, supervision of patient - no hands on  Assistive Device Four wheel walker  Distance Ambulated (ft) 200 ft  Activity Response Tolerated well  Mobility Referral Yes  Mobility visit 1 Mobility  Mobility Specialist Start Time (ACUTE ONLY) 1450  Mobility Specialist Stop Time (ACUTE ONLY) 1510  Mobility Specialist Time Calculation (min) (ACUTE ONLY) 20 min   Pt agreeable to mobility. On 4LO2 upon arrival. Required no physical assistance during ambulation, SV. C/o dizziness upon standing, however recovered. Ambulated on 4LO2, VSS throughout. No c/o during session. Pt returned back to chair with all needs met, waiting for a bath.   Caesar Bookman Mobility Specialist Please contact via SecureChat or [employer] [phone]

## 2023-05-26 NOTE — Progress Notes (Signed)
Late entry - 0830 Patient assisted to the [facility] without difficulty.  No distress noted.  Wound packing (2 pieces of gauze with blood tinged drainage noted. Colostomy emptied by CNA and stated bag didn't close as intended and soiled area noted on bed, floor etc..Patient now sitting in the chair.  Requested nystatin cream not be placed until she was bathed. JP drains intact x 2 and bulb suction.  Complained of pain rating 8-10/10.  See meds given.  Swallowed oral meds without difficulty and IV meds given via double lumen PICC line right AC.  Will continue to monitor.  Stated wanted dressing redressed when she went back to bed.  Abdomen pad still cover wound.  OT here to see.  Family here to see.

## 2023-05-26 NOTE — TOC Progression Note (Addendum)
Transition of Care [facility]) - Progression Note    Patient Details  Name: Kathleen [name] MRN: [id number] Date of Birth: 1945/10/01  Transition of Care [facility]) CM/SW Contact  Erin Sons, [place] Phone Number: 05/26/2023, 11:18 AM  Clinical Narrative:     CSW met with pt and provided SNF bed offers with medicare star ratings. Pt will discuss with her son; CSW to follow up after lunch for SNF choice.   1300: CSW met with pt and pt son bedside. Discussed SNF choice. Pt would like to go with [facility] though if they cannot take pt, [facility] would be second choice.   CSW spoke with [facility] SNF liaison and informed initial offer must have been for LTAC. They will review referral for SNF level and notify CSW with decision.   1445: Spoke with DJ at [facility] who informed CSW that they can offer LTACH bed. Spoke with pt's son who is agreeable to this. CSW notified attending.   Expected Discharge Plan: Skilled Nursing Facility Barriers to Discharge: Continued Medical Work up, SNF Pending bed offer  Expected Discharge Plan and Services In-house Referral: Clinical Social Work   Post Acute Care Choice: Skilled Nursing Facility Living arrangements for the past 2 months: Independent Living Facility ([facility])                                       Social Determinants of Health (SDOH) Interventions SDOH Screenings   Food Insecurity: No Food Insecurity (04/22/2023)  Housing: Low Risk  (04/22/2023)  Transportation Needs: No Transportation Needs (04/22/2023)  Utilities: Not At Risk (04/22/2023)  Alcohol Screen: Low Risk  (05/21/2022)  Depression (PHQ2-9): Low Risk  (05/21/2022)  Financial Resource Strain: Low Risk  (05/21/2022)  Physical Activity: Insufficiently Active (05/21/2022)  Social Connections: Moderately Isolated (05/21/2022)  Stress: No Stress Concern Present (05/21/2022)  Tobacco Use: Medium Risk (05/24/2023)    Readmission Risk Interventions    05/18/2023    12:10 PM 04/26/2023    9:51 AM 01/14/2023   11:16 AM  Readmission Risk Prevention Plan  Transportation Screening Complete Complete Complete  PCP or Specialist Appt within 5-7 Days   Complete  PCP or Specialist Appt within 3-5 Days  Complete   Home Care Screening   Complete  Medication Review (RN CM)   Complete  HRI or Home Care Consult  Complete   Palliative Care Screening  --   Medication Review (RN Care Manager) Complete Complete   PCP or Specialist appointment within 3-5 days of discharge Complete    HRI or Home Care Consult Complete    SW Recovery Care/Counseling Consult Complete    Palliative Care Screening Not Applicable    Skilled Nursing Facility Complete

## 2023-05-26 NOTE — Progress Notes (Signed)
Nutrition Follow-up  DOCUMENTATION CODES:   Non-severe (moderate) malnutrition in context of chronic illness  INTERVENTION:  Continue with reg diet as tolerated Ensure Plus High Protein po BID, each supplement provides 350 kcal and 20 grams of protein.  Magic cup TID with meals, each supplement provides 290 kcal and 9 grams of protein   NUTRITION DIAGNOSIS:   Moderate Malnutrition related to chronic illness as evidenced by mild fat depletion, mild muscle depletion, energy intake < 75% for > 7 days.  Improving with interventions in place  GOAL:   Patient will meet greater than or equal to 90% of their needs    MONITOR:   PO intake, Supplement acceptance, Diet advancement, Labs, Weight trends  REASON FOR ASSESSMENT:   Consult New TPN/TNA  ASSESSMENT:   77 y.o. F, admitted with PMH of hypothyroidism, COPD,OSA (on BiPAP), BOOP (noncandidate for stenting), diverticulitis, CHF, obesity, HTN, gout, and colovaginal fistula. Presented 11/14 for worsening diverticulitis with new bowel perforation.  She no longer receiving TPN.  Her diet is regular and  appears to be tolerating well. Pt in good spirits today. Reports that abdominal pain is decreasing.  Significant events: 11/14>> admit to [facility] 11/19>>exploratory laparotomy with sigmoid colectomy and end ostomy  12/04>> NG removed-clear liquid started   Significant studies: 11/14>> CT abdomen/pelvis: Worsening signal right diverticulitis-localized perforation-5.6 cm perirectal abscess, 6.0 cm left lower quadrant abscess. 11/22>> x-ray abdomen: Consistent with postoperative ileus. 11/25>> CT abdomen/pelvis: No abscess 12/01>> CT abdomen/pelvis: Mild small bowel ileus-no obvious abscess-small fluid collection in the colonic stump   Significant microbiology data: 11/14>> blood culture: No growth 11/14>> COVID/influenza/RSV PCR: Negative   Procedures:   11/19>>exploratory laparotomy with sigmoid colectomy  end ostomy on  11/19 12/15 >> CT -  1. 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 2. Small postoperative collection in the posterior right lower peritoneal cavity, slightly decreased. No new fluid collections. 3. Scattered fat stranding and ill-defined fluid in the midline ventral abdominal laparotomy site without measurable fluid collection. 4. Ovoid 1.1 cm hyperdense structure in the lower thoracic esophagus, cannot exclude a ingested foreign body, either retained oral contrast or pill fragment. 5. Subpleural 0.4 cm peripheral right lower lobe pulmonary nodule, decreased from 0.6 cm, favoring a benign etiology. 6.  Aortic Atherosclerosis (ICD10-I70.0).    EGD 05/24/23- suggestive of esophageal candidiasis     NUTRITION - FOCUSED PHYSICAL EXAM:  Flowsheet Row Most Recent Value  Orbital Region Mild depletion  Upper Arm Region Mild depletion  Thoracic and Lumbar Region Unable to assess  [Fluid]  Buccal Region No depletion  Temple Region Moderate depletion  Clavicle Bone Region Unable to assess  [Fluid]  Clavicle and Acromion Bone Region Mild depletion  Scapular Bone Region Mild depletion  Dorsal Hand Mild depletion  Patellar Region Unable to assess  [Fluid]  Anterior Thigh Region Unable to assess  [Fluid]  Posterior Calf Region Unable to assess  [Fluid]  Edema (RD Assessment) Severe  [Abdomen and leg edema]  Hair Reviewed  Eyes Reviewed  Mouth Reviewed  Skin Reviewed  Nails Reviewed       Diet Order:   Diet Order             Diet regular Room service appropriate? Yes; Fluid consistency: Thin  Diet effective now                   EDUCATION NEEDS:   Education needs have been addressed  Skin:  Skin Assessment: Reviewed RN Assessment Skin Integrity Issues:: Wound VAC Wound Vac: abdomen Other: new colostomy (11/18)  Last BM:  12/17  Height:   Ht Readings from Last 1 Encounters:  04/27/23 5' (1.524 m)     Weight:   Wt Readings from Last 1 Encounters:  05/26/23 84.7 kg    Ideal Body Weight:  45.45 kg  BMI:  Body mass index is 36.47 kg/m.  Estimated Nutritional Needs:   Kcal:  1600-1800 kcal  Protein:  75-95 gm  Fluid:  >1.6L    Jamelle Haring RDN, LDN Clinical Dietitian  Pleas see Amion for contact information

## 2023-05-26 NOTE — Discharge Instructions (Signed)
[facility]      [facility] °[phone] ° °OPEN ABDOMINAL SURGERY: POST OP INSTRUCTIONS ° °Always review your discharge instruction sheet given to you by the facility where your surgery was performed. ° °IF YOU HAVE DISABILITY OR FAMILY LEAVE FORMS, YOU MUST BRING THEM TO THE OFFICE FOR PROCESSING.  PLEASE DO NOT GIVE THEM TO YOUR DOCTOR. ° °A prescription for pain medication may be given to you upon discharge.  Take your pain medication as prescribed, if needed.  If narcotic pain medicine is not needed, then you may take acetaminophen (Tylenol) or ibuprofen (Advil) as needed. °Take your usually prescribed medications unless otherwise directed. °If you need a refill on your pain medication, please contact your pharmacy. They will contact our office to request authorization.  Prescriptions will not be filled after 5pm or on week-ends. °You should follow a light diet the first few days after arrival home, such as soup and crackers, pudding, etc.unless your doctor has advised otherwise. A high-fiber, low fat diet can be resumed as tolerated.   Be sure to include lots of fluids daily. Most patients will experience some swelling and bruising on the chest and neck area.  Ice packs will help.  Swelling and bruising can take several days to resolve °Most patients will experience some swelling and bruising in the area of the incision. Ice pack will help. Swelling and bruising can take several days to resolve..  °It is common to experience some constipation if taking pain medication after surgery.  Increasing fluid intake and taking a stool softener will usually help or prevent this problem from occurring.  A mild laxative (Milk of Magnesia or Miralax) should be taken according to package directions if there are no bowel movements after 48 hours. ° You may have steri-strips (small skin tapes) in place directly over the incision.  These strips should be left on the skin for 7-10 days.  If your surgeon used skin  glue on the incision, you may shower in 24 hours.  The glue will flake off over the next 2-3 weeks.  Any sutures or staples will be removed at the office during your follow-up visit. You may find that a light gauze bandage over your incision may keep your staples from being rubbed or pulled. You may shower and replace the bandage daily. °ACTIVITIES:  You may resume regular (light) daily activities beginning the next day--such as daily self-care, walking, climbing stairs--gradually increasing activities as tolerated.  You may have sexual intercourse when it is comfortable.  Refrain from any heavy lifting or straining until approved by your doctor. °You may drive when you no longer are taking prescription pain medication, you can comfortably wear a seatbelt, and you can safely maneuver your car and apply brakes ° °You should see your doctor in the office for a follow-up appointment approximately two weeks after your surgery.  Make sure that you call for this appointment within a day or two after you arrive home to insure a convenient appointment time. ° °WHEN TO CALL YOUR DOCTOR: °Fever over 101.0 °Inability to urinate °Nausea and/or vomiting °Extreme swelling or bruising °Continued bleeding from incision. °Increased pain, redness, or drainage from the incision. °Difficulty swallowing or breathing °Muscle cramping or spasms. °Numbness or tingling in hands or feet or around lips. ° °The clinic staff is available to answer your questions during regular business hours.  Please don’t hesitate to call and ask to speak to one of the nurses if you have concerns. ° °For   For further questions, please visit [email or web address]   WOUND CARE: - dressing to be changed twice daily - supplies: sterile saline, kerlix/guaze, scissors, ABD pads, tape  - remove dressing and all packing carefully, moistening with sterile saline as needed to avoid packing/internal dressing sticking to the wound. - clean edges of skin around  the wound with water/gauze, making sure there is no tape debris or leakage left on skin that could cause skin irritation or breakdown. - dampen clean kerlix/gauze with sterile saline and pack wound from wound base to skin level, making sure to take note of any possible areas of wound tracking, tunneling and packing appropriately. Wound can be packed loosely. Trim kerlix/gauze to size if a whole roll/piece is not required. - cover wound with a dry ABD pad and secure with tape.  - write the date/time on the dry dressing/tape to better track when the last dressing change occurred. - apply any skin protectant/powder recommended by clinician to protect skin/skin folds. - change dressing as needed if leakage occurs, wound gets contaminated, or patient requests to shower. - patient may shower daily with wound open and following the shower the wound should be dried and a clean dressing placed.

## 2023-05-27 ENCOUNTER — Encounter (HOSPITAL_COMMUNITY): Payer: Self-pay | Admitting: Internal Medicine

## 2023-05-27 DIAGNOSIS — K5720 Diverticulitis of large intestine with perforation and abscess without bleeding: Secondary | ICD-10-CM | POA: Diagnosis not present

## 2023-05-27 DIAGNOSIS — I498 Other specified cardiac arrhythmias: Secondary | ICD-10-CM | POA: Diagnosis not present

## 2023-05-27 LAB — PHOSPHORUS: Phosphorus: 4.7 mg/dL — ABNORMAL HIGH (ref 2.5–4.6)

## 2023-05-27 LAB — COMPREHENSIVE METABOLIC PANEL
ALT: 10 U/L (ref 0–44)
AST: 14 U/L — ABNORMAL LOW (ref 15–41)
Albumin: 2.6 g/dL — ABNORMAL LOW (ref 3.5–5.0)
Alkaline Phosphatase: 61 U/L (ref 38–126)
Anion gap: 9 (ref 5–15)
BUN: 23 mg/dL (ref 8–23)
CO2: 27 mmol/L (ref 22–32)
Calcium: 10.1 mg/dL (ref 8.9–10.3)
Chloride: 98 mmol/L (ref 98–111)
Creatinine, Ser: 0.82 mg/dL (ref 0.44–1.00)
GFR, Estimated: >60 mL/min (ref >60)
Glucose, Bld: 160 mg/dL — ABNORMAL HIGH (ref 70–99)
Potassium: 4.5 mmol/L (ref 3.5–5.1)
Sodium: 134 mmol/L — ABNORMAL LOW (ref 135–145)
Total Bilirubin: 0.5 mg/dL (ref <1.2)
Total Protein: 5.2 g/dL — ABNORMAL LOW (ref 6.5–8.1)

## 2023-05-27 LAB — CBC WITH DIFFERENTIAL/PLATELET
Abs Immature Granulocytes: 0.45 10*3/uL — ABNORMAL HIGH (ref 0.00–0.07)
Basophils Absolute: 0.1 10*3/uL (ref 0.0–0.1)
Basophils Relative: 1 %
Eosinophils Absolute: 0.8 10*3/uL — ABNORMAL HIGH (ref 0.0–0.5)
Eosinophils Relative: 8 %
HCT: 28.8 % — ABNORMAL LOW (ref 36.0–46.0)
Hemoglobin: 8.8 g/dL — ABNORMAL LOW (ref 12.0–15.0)
Immature Granulocytes: 4 %
Lymphocytes Relative: 11 %
Lymphs Abs: 1.1 10*3/uL (ref 0.7–4.0)
MCH: 25.8 pg — ABNORMAL LOW (ref 26.0–34.0)
MCHC: 30.6 g/dL (ref 30.0–36.0)
MCV: 84.5 fL (ref 80.0–100.0)
Monocytes Absolute: 0.8 10*3/uL (ref 0.1–1.0)
Monocytes Relative: 7 %
Neutro Abs: 7.3 10*3/uL (ref 1.7–7.7)
Neutrophils Relative %: 69 %
Platelets: 300 10*3/uL (ref 150–400)
RBC: 3.41 MIL/uL — ABNORMAL LOW (ref 3.87–5.11)
RDW: 16.5 % — ABNORMAL HIGH (ref 11.5–15.5)
WBC: 10.6 10*3/uL — ABNORMAL HIGH (ref 4.0–10.5)
nRBC: 0.2 % (ref 0.0–0.2)

## 2023-05-27 LAB — MAGNESIUM: Magnesium: 1.8 mg/dL (ref 1.7–2.4)

## 2023-05-27 LAB — PROCALCITONIN: Procalcitonin: <0.1 ng/mL

## 2023-05-27 LAB — C-REACTIVE PROTEIN: CRP: 1.7 mg/dL — ABNORMAL HIGH (ref <1.0)

## 2023-05-27 LAB — BRAIN NATRIURETIC PEPTIDE: B Natriuretic Peptide: 190 pg/mL — ABNORMAL HIGH (ref 0.0–100.0)

## 2023-05-27 LAB — GLUCOSE, CAPILLARY: Glucose-Capillary: 138 mg/dL — ABNORMAL HIGH (ref 70–99)

## 2023-05-27 MED ORDER — INSULIN ASPART 100 UNIT/ML FLEXPEN: PEN_INJECTOR | SUBCUTANEOUS | Status: DC

## 2023-05-27 MED ORDER — PROSOURCE PLUS PO LIQD: 30.0000 mL | Freq: Two times a day (BID) | ORAL | Status: DC

## 2023-05-27 MED ORDER — OXYCODONE HCL 5 MG PO TABS: 5.0000 mg | ORAL_TABLET | Freq: Four times a day (QID) | ORAL | 0 refills | Status: DC | PRN

## 2023-05-27 MED ORDER — NYSTATIN 100000 UNIT/GM EX POWD: Freq: Three times a day (TID) | CUTANEOUS | Status: DC

## 2023-05-27 MED ORDER — FLUCONAZOLE 200 MG PO TABS: 200.0000 mg | ORAL_TABLET | Freq: Every day | ORAL | Status: AC

## 2023-05-27 MED ORDER — ONDANSETRON HCL 4 MG PO TABS: 4.0000 mg | ORAL_TABLET | Freq: Three times a day (TID) | ORAL | Status: DC | PRN

## 2023-05-27 NOTE — Consult Note (Signed)
WOC Nurse ostomy Follow up note Stoma type/location: Stoma is red and viable, 1 1/2 inches, slightly above skin level.  40 ml stools.   Pt is ready to discharge. Changed the pouch system reviewing all the ostomy care orientation: - Change the barrier each 3 to 5 days. - Empty the pouch when 1/3 full. - Use water and soap to clean the periostomy skin. - Be careful to not contaminated the surgical wound when take off the barrier, do separately the dressing. - Cut respecting the size and shape of the ostomy, before change the system.  - Apply the ring on the barrier, surrounding the cut, and put the barrier into the skin, check in if is attached to the skin correctly, surrounding the stoma. - Attach the bag, as a ziplock, do not need to press the abd. Open and close the pouch if is needed.   Order supplies before discharge, she will go out with 5 kits.  Applied pouch as follows: Barrier ring and 2 piece convex pouching system.  Use Supplies: Barrier ring [id number] Pouch [phone] Barrier [id number].     Thank-you,  Denyse Amass BSN, RN, [employer], WOC  (Pager: [phone])

## 2023-05-27 NOTE — Patient Outreach (Signed)
  Care Coordination   Case Cosure  Visit Note   05/27/2023 Name: Kathleen [name] MRN: [id number] DOB: 1946/05/27  Kathleen [name] is a 77 y.o. year old female who sees [name], MD for primary care. No patient contact was made during this encounter.  RNCM received message per [facility], that patient has been transferred to [facility] on 05/27/23. Case closed.    Goals Addressed             This Visit's Progress    COMPLETED: Asssit with health management       Interventions Today    Flowsheet Row Most Recent Value  General Interventions   General Interventions Discussed/Reviewed General Interventions Reviewed  [facility] liaison-patient transferred to [facility]. case closed/goals closed.]              SDOH assessments and interventions completed:  No  Care Coordination Interventions:  No, not indicated   Follow up plan: No further intervention required.   Encounter Outcome:  Patient Visit Completed

## 2023-05-27 NOTE — Discharge Summary (Signed)
Kathleen [name]:[id number] DOB: Apr 22, 1946 DOA: 04/22/2023  PCP: [name], MD  Admit date: 04/22/2023  Discharge date: 05/27/2023  Admitted From: Home   Disposition:  LTACH   Recommendations for Outpatient Follow-up:   Follow up with PCP in 1-2 weeks  PCP Please obtain BMP/CBC, 2 view CXR in 1week,  (see Discharge instructions)   PCP Please follow up on the following pending results:    Home Health: None   Equipment/Devices: None  Consultations: CCS, GI, Cards Discharge Condition: Stable    CODE STATUS: Full    Diet Recommendation: Heart Healthy   Diet Order             Diet regular Room service appropriate? Yes; Fluid consistency: Thin  Diet effective now                    Chief Complaint  Patient presents with   Abdominal Pain     Brief history of present illness from the day of admission and additional interim summary    77 y.o.  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  Admitted by [facility]-started on empiric IV antibiotics-underwent preop assessment by cardiology-and then subsequently underwent  exploratory laparotomy with sigmoid colectomy and end ostomy on 11/19.  Postoperative course complicated by ileus-requiring NG tube decompression-and TNA for nutrition.  See below for further details.   Significant events: 11/14>> admit to [facility] 11/19>>exploratory laparotomy with sigmoid colectomy and end ostomy  12/04>> NG removed-clear liquid started   Significant studies: 11/14>> CT abdomen/pelvis: Worsening signal right diverticulitis-localized perforation-5.6 cm perirectal  abscess, 6.0 cm left lower quadrant abscess. 11/22>> x-ray abdomen: Consistent with postoperative ileus. 11/25>> CT abdomen/pelvis: No abscess 12/01>> CT abdomen/pelvis: Mild small bowel ileus-no obvious abscess-small fluid collection in the colonic stump   Significant microbiology data: 11/14>> blood culture: No growth 11/14>> COVID/influenza/RSV PCR: Negative   Procedures:   11/19>>exploratory laparotomy with sigmoid colectomy  end ostomy on 11/19 12/15 >> CT -  1. 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 2. Small postoperative collection in the posterior right lower peritoneal cavity, slightly decreased. No new fluid collections. 3. Scattered fat stranding and ill-defined fluid in the midline ventral abdominal laparotomy site without measurable fluid collection. 4. Ovoid 1.1 cm hyperdense structure in the lower thoracic esophagus, cannot exclude a ingested foreign body, either retained oral contrast or pill fragment. 5. Subpleural 0.4 cm peripheral right lower lobe pulmonary nodule, decreased from 0.6 cm, favoring a benign etiology. 6.  Aortic Atherosclerosis (ICD10-I70.0).    EGD 05/24/23   Impression:               - Esophageal plaques were found, consistent with  candidiasis.                           - No evidence of esophageal foreign body.                           - Normal stomach.                           - Normal examined duodenum.                           - No specimens collected. Moderate Sedation:      N/A Recommendation:           - Patient has a contact number available for                            emergencies. The signs and symptoms of potential                            delayed complications were discussed with the                            patient. Return to normal activities tomorrow.                            Written discharge instructions were provided to  the                            patient.                           - Resume previous diet.                           - Continue present medications.                           - Fluconazole 400 mg x 1 day, 200 mg x 13 days for                            Candida esophagitis.                           - GI inpatient service will sign off, call if                            questions.                                                                 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  TNA has been stopped.  Finished antibiotics.   Overall had improved-NG tube discontinued 12/4, TNA held by general surgery on 05/25/2019 for trial of oral diet, continue to monitor add protein supplements as well, finished her antibiotics which was Zosyn on 05/18/2023.     Surgery following, JP drain and colostomy in place, had increased nausea on 05/23/2023, CT repeated on 05/23/2023 and case discussed with general surgeon Dr. Cliffton Asters, continue supportive care, CT scan does show questionable retained foreign body in the esophagus.  GI was consulted underwent EGD with no foreign body but mild oral and esophageal candidiasis for which she has been put on nystatin orally till 06/07/2023 by GI with improvement in symptoms.  Case discussed with general surgery in detail on 05/26/2023 patient stable for discharge to LTAC.  Post discharge follow-up with general surgery in 1 to [redacted] weeks along with [facility] in the same timeframe.  And overall much improved, minimal postop site abdominal discomfort, nausea much improved.  Overall much better and looking forward to going to LTAC.     Esophageal candidiasis likely contributing to her nausea.  EGD done by GI on 05/24/2023 suggestive of  esophageal candidiasis.  Oral nystatin as recommended by GI and follow-up.  Nausea mildly improved but still is nauseated continue to monitor.  Follow-up with  GI in 1 to 2 weeks postdischarge.    Normocytic anemia Secondary to critical illness No evidence of blood loss Transfused 1 additional unit of PRBC on 12/10 Continue to follow CBC periodically.  Stable for now.   Persistent atrial fibrillation Telemetry monitoring Tikosyn orally-attempt to keep K> 4 Initially on IV heparin-has been switched to Xarelto   History of AV nodal ablation-s/p PPM ([employer]) Telemetry monitoring   Chronic HFrEF (EF 35-40% on 4/9 by TTE) Volume status reasonably stable but some leg edema   Continue on home diuretic regimen monitor BMP closely at LTAC    Pannus cellulitis.  Nystatin powder and monitor.  Keep the pannus area clean and dry at all times.   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  Likely at baseline 4 L nasal cannula oxygen which she chronically uses.   GERD PPI   Nutrition Status: Nutrition Problem: Moderate Malnutrition -protein supplementation and monitor   Obesity: Estimated body mass index is 36.47 kg/m - follow with PCP  DM-2 (A1c 6.2 on 11/15) CBGs stable on SSI QAC-HS    Discharge diagnosis     Principal Problem:   Diverticulitis of colon with perforation Active Problems:   Protein-calorie malnutrition, moderate (HCC)   Chronic respiratory failure with hypoxia (HCC)   Chronic combined systolic and diastolic heart failure (HCC)   HTN (hypertension)   COPD (chronic obstructive pulmonary disease) (HCC)   Persistent atrial fibrillation (HCC)   Type 2 diabetes mellitus with diabetic neuropathy, without long-term current use of insulin (HCC)   Chronic  anticoagulation   Acquired hypothyroidism   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  IF YOU HAVE DISABILITY OR FAMILY LEAVE FORMS, YOU MUST BRING THEM TO THE OFFICE FOR PROCESSING.  PLEASE DO NOT GIVE THEM TO YOUR DOCTOR.  1. A prescription for pain medication may be given to you upon discharge.  Take your pain medication as prescribed, if needed.  If narcotic pain medicine is not needed, then you may take acetaminophen (Tylenol) or ibuprofen (Advil) as needed. 2. Take your usually prescribed medications unless otherwise directed. 3. If you need a refill on your pain medication, please contact your pharmacy. They will contact our office to request authorization.  Prescriptions will not be filled after 5pm or on week-ends. 4. You should follow a light diet the first few days after arrival home, such as soup and crackers, pudding, etc.unless your doctor has advised otherwise. A high-fiber, low fat diet can be resumed as tolerated.   Be sure to include lots of fluids daily. Most patients will experience some swelling and bruising on the chest and neck area.  Ice packs will help.  Swelling and bruising can take several days to resolve 5. Most patients will experience some swelling and bruising in the area of the incision. Ice pack will help. Swelling and bruising can take several days to resolve..  6. It is common to experience some constipation if taking pain medication after surgery.  Increasing fluid intake and  taking a stool softener will usually help or prevent this problem from occurring.  A mild laxative (Milk of Magnesia or Miralax) should be taken according to package directions if there are no bowel movements after 48 hours. 7.  You may have steri-strips (small skin tapes) in place directly over the incision.  These strips should be left on the skin for 7-10 days.  If your surgeon used skin glue on the incision, you may shower in 24 hours.  The glue will flake off over the next 2-3 weeks.  Any sutures or staples will be removed at the office during your follow-up visit. You may find that a light gauze bandage over your incision may keep your staples from being rubbed or pulled. You may shower and replace the bandage daily. 8. ACTIVITIES:  You may resume regular (light) daily activities beginning the next day-such as daily self-care, walking, climbing stairs-gradually increasing activities as tolerated.  You may have sexual intercourse when it is comfortable.  Refrain from any heavy lifting or straining until approved by your doctor.  a. You may drive when you no longer are taking prescription pain medication, you can comfortably wear a seatbelt, and you can safely maneuver your car and apply brakes  9. You should see your doctor in the office for a follow-up appointment approximately two weeks after your surgery.  Make sure that you call for this appointment within a day or two after you arrive home to insure a convenient appointment time.   WHEN TO CALL YOUR DOCTOR: 1. Fever over 101.0 2. Inability to urinate 3. Nausea and/or vomiting 4. Extreme swelling or bruising 5. Continued bleeding from incision. 6. Increased pain, redness, or drainage from the incision. 7. Difficulty swallowing or breathing 8. Muscle cramping or spasms. 9. Numbness or tingling in hands or feet or around lips.  The clinic staff is available to answer your questions during regular business hours.  Please don't hesitate to  call and ask to speak to one of the nurses if you have  concerns.  For further questions, please visit [email or web address]   WOUND CARE: - dressing to be changed twice daily - supplies: sterile saline, kerlix/guaze, scissors, ABD pads, tape  - remove dressing and all packing carefully, moistening with sterile saline as needed to avoid packing/internal dressing sticking to the wound. - clean edges of skin around the wound with water/gauze, making sure there is no tape debris or leakage left on skin that could cause skin irritation or breakdown. - dampen clean kerlix/gauze with sterile saline and pack wound from wound base to skin level, making sure to take note of any possible areas of wound tracking, tunneling and packing appropriately. Wound can be packed loosely. Trim kerlix/gauze to size if a whole roll/piece is not required. - cover wound with a dry ABD pad and secure with tape.  - write the date/time on the dry dressing/tape to better track when the last dressing change occurred. - apply any skin protectant/powder recommended by clinician to protect skin/skin folds. - change dressing as needed if leakage occurs, wound gets contaminated, or patient requests to shower. - patient may shower daily with wound open and following the shower the wound should be dried and a clean dressing placed.      Follow with Primary MD [name], MD in 7 days   Get CBC, CMP, magnesium, phosphorus, 2 view Chest X ray -  checked by your LTACH MD in 2 days  Activity: As tolerated with Full fall precautions use walker/cane & assistance as needed  Disposition LTACH  Diet: Heart Healthy   Special Instructions: If you have smoked or chewed Tobacco  in the last 2 yrs please stop smoking, stop any regular Alcohol  and or any Recreational drug use.  On your next visit with your primary care physician please Get Medicines reviewed and adjusted.  Please request your Prim.MD to go over all  Hospital Tests and Procedure/Radiological results at the follow up, please get all Hospital records sent to your Prim MD by signing hospital release before you go home.  If you experience worsening of your admission symptoms, develop shortness of breath, life threatening emergency, suicidal or homicidal thoughts you must seek medical attention immediately by calling 911 or calling your MD immediately  if symptoms less severe.  You Must read complete instructions/literature along with all the possible adverse reactions/side effects for all the Medicines you take and that have been prescribed to you. Take any new Medicines after you have completely understood and accpet all the possible adverse reactions/side effects.   Do not drive when taking Pain medications.  Do not take more than prescribed Pain, Sleep and Anxiety Medications   Discharge wound care:   Complete by: As directed    WOUND CARE: - dressing to be changed twice daily - supplies: sterile saline, kerlix/guaze, scissors, ABD pads, tape  - remove dressing and all packing carefully, moistening with sterile saline as needed to avoid packing/internal dressing sticking to the wound. - clean edges of skin around the wound with water/gauze, making sure there is no tape debris or leakage left on skin that could cause skin irritation or breakdown. - dampen clean kerlix/gauze with sterile saline and pack wound from wound base to skin level, making sure to take note of any possible areas of wound tracking, tunneling and packing appropriately. Wound can be packed loosely. Trim kerlix/gauze to size if a whole roll/piece is not required. - cover wound with a dry ABD pad and secure with tape.  - write  the date/time on the dry dressing/tape to better track when the last dressing change occurred. - apply any skin protectant/powder recommended by clinician to protect skin/skin folds. - change dressing as needed if leakage occurs, wound gets contaminated,  or patient requests to shower. - patient may shower daily with wound open and following the shower the wound should be dried and a clean dressing placed.   Increase activity slowly   Complete by: As directed        Discharge Medications   Allergies as of 05/27/2023       Reactions   Allopurinol Other (See Comments)   Reaction:  Dizziness    Clindamycin Anaphylaxis, Hives   Flublok [influenza Vaccine Recombinant] Other (See Comments)   Fever 103 with no alternative explanation day after vaccine.  Clydie Braun Highfill FNP-C   Pneumococcal 13-val Conj Vacc Itching, Swelling, Rash   Dronedarone Rash   Montelukast Other (See Comments)   Makes her loopy.   Brovana [arformoterol]    Caused muscle pain   Budesonide    Caused extreme joint pain   Entresto [sacubitril-valsartan] Other (See Comments)   hypotension   Fosamax [alendronate Sodium] Nausea Only   Jardiance [empagliflozin]    Caused a vaginal infection   Meperidine Nausea And Vomiting   Microplegia Msa-msg [plegisol]    Loopy,diarrhea   Rosuvastatin Other (See Comments)   Reaction:  Muscle spasms    Tetracycline Hives   Adhesive [tape] Rash   Lovastatin Rash, Other (See Comments)   Muscle Pain        Medication List     STOP taking these medications    amoxicillin-clavulanate 875-125 MG tablet Commonly known as: AUGMENTIN   nystatin 100000 UNIT/ML suspension Commonly known as: MYCOSTATIN   Trulance 3 MG Tabs Generic drug: Plecanatide       TAKE these medications    (feeding supplement) PROSource Plus liquid Take 30 mLs by mouth 2 (two) times daily between meals.   acetaminophen 500 MG tablet Commonly known as: TYLENOL Take 1,000 mg by mouth as needed.   Breo Ellipta 100-25 MCG/ACT Aepb Generic drug: fluticasone furoate-vilanterol INHALE 1 PUFF INTO THE LUNGS DAILY   cyanocobalamin 1000 MCG/ML injection Commonly known as: VITAMIN B12 Inject 1 mL (1,000 mcg total) into the muscle every 30 (thirty)  days.   Desitin Maximum Strength 40 % Pste Generic drug: Zinc Oxide Apply 1 Application topically 4 (four) times daily. Mix with aquaphor and apply to inflamed area   docusate sodium 100 MG capsule Commonly known as: COLACE Take 100 mg by mouth daily.   dofetilide 250 MCG capsule Commonly known as: TIKOSYN Take 1 capsule (250 mcg total) by mouth in the morning and at bedtime.   febuxostat 40 MG tablet Commonly known as: ULORIC TAKE 1 TABLET(40 MG) BY MOUTH DAILY What changed: See the new instructions.   fluconazole 200 MG tablet Commonly known as: DIFLUCAN Take 1 tablet (200 mg total) by mouth daily for 11 days.   gabapentin 100 MG capsule Commonly known as: Neurontin Take 2 capsules (200 mg total) by mouth at bedtime.   insulin aspart 100 UNIT/ML FlexPen Commonly known as: NOVOLOG Before each meal 3 times a day, 140-199 - 2 units, 200-250 - 4 units, 251-299 - 6 units,  300-349 - 8 units,  350 or above 10 units.   levalbuterol 0.63 MG/3ML nebulizer solution Commonly known as: XOPENEX Take 3 mLs (0.63 mg total) by nebulization every 6 (six) hours as needed for wheezing or shortness of  breath. DX J44.89 J96.21   Magnesium 500 MG Tabs Take 500 mg by mouth 2 (two) times daily.   nystatin powder Commonly known as: MYCOSTATIN/NYSTOP Apply topically 3 (three) times daily.   ondansetron 4 MG tablet Commonly known as: Zofran Take 1 tablet (4 mg total) by mouth every 8 (eight) hours as needed for nausea or vomiting.   oxyCODONE 5 MG immediate release tablet Commonly known as: Oxy IR/ROXICODONE Take 1-2 tablets (5-10 mg total) by mouth every 6 (six) hours as needed for moderate pain (pain score 4-6) or severe pain (pain score 7-10) (5mg  moderate, 10mg  severe).   OXYGEN Inhale 4 L into the lungs continuous. Use with resmed ventilator   pantoprazole 40 MG tablet Commonly known as: PROTONIX Take 1 tablet (40 mg total) by mouth daily.   potassium chloride 10 MEQ  tablet Commonly known as: KLOR-CON Take 40 mEq by mouth daily.   revefenacin 175 MCG/3ML nebulizer solution Commonly known as: YUPELRI Take 175 mcg by nebulization daily.   rivaroxaban 20 MG Tabs tablet Commonly known as: Xarelto Take 1 tablet (20 mg total) by mouth daily with supper.   spironolactone 25 MG tablet Commonly known as: ALDACTONE Take 1 tablet (25 mg total) by mouth 2 (two) times daily.   torsemide 20 MG tablet Commonly known as: DEMADEX Take 2 tablets (40 mg total) by mouth daily.   Vitamin D3 50 MCG (2000 UT) Tabs Take 2,000 Units by mouth at bedtime.               Discharge Care Instructions  (From admission, onward)           Start     Ordered   05/27/23 0000  Discharge wound care:       Comments: WOUND CARE: - dressing to be changed twice daily - supplies: sterile saline, kerlix/guaze, scissors, ABD pads, tape  - remove dressing and all packing carefully, moistening with sterile saline as needed to avoid packing/internal dressing sticking to the wound. - clean edges of skin around the wound with water/gauze, making sure there is no tape debris or leakage left on skin that could cause skin irritation or breakdown. - dampen clean kerlix/gauze with sterile saline and pack wound from wound base to skin level, making sure to take note of any possible areas of wound tracking, tunneling and packing appropriately. Wound can be packed loosely. Trim kerlix/gauze to size if a whole roll/piece is not required. - cover wound with a dry ABD pad and secure with tape.  - write the date/time on the dry dressing/tape to better track when the last dressing change occurred. - apply any skin protectant/powder recommended by clinician to protect skin/skin folds. - change dressing as needed if leakage occurs, wound gets contaminated, or patient requests to shower. - patient may shower daily with wound open and following the shower the wound should be dried and a clean  dressing placed.   05/27/23 6010             Follow-up Information     Emelia Loron, MD. Call.   Specialty: General Surgery Why: Call to schedule follow up once discharged from LTACH/SNF Contact information: [address] [place] [address] [phone]         [name], MD. Schedule an appointment as soon as possible for a visit in 1 week(s).   Specialty: Internal Medicine Contact information: [address] [place] [address] [phone]  Major procedures and Radiology Reports - PLEASE review detailed and final reports thoroughly  -      DG Chest Port 1 View Result Date: 05/25/2023 CLINICAL DATA:  Short of breath, cough EXAM: PORTABLE CHEST 1 VIEW COMPARISON:  05/04/2023, 05/23/2023 FINDINGS: Single frontal view of the chest demonstrates stable multi lead pacer. Right-sided PICC tip overlies superior vena cava. The cardiac silhouette is enlarged but stable. Chronic atelectasis at the left lung base. Stable left pleural thickening based on prior CT exam. No airspace disease, effusion, or pneumothorax. IMPRESSION: 1. Stable left basilar atelectasis or scarring and chronic left pleural thickening. 2. No acute intrathoracic process. Electronically Signed   By: Sharlet Salina M.D.   On: 05/25/2023 09:27   CT ABDOMEN PELVIS WO CONTRAST Result Date: 05/23/2023 CLINICAL DATA:  Postoperative abdominal pain, follow-up abscesses. Inpatient. EXAM: CT ABDOMEN AND PELVIS WITHOUT CONTRAST TECHNIQUE: Multidetector CT imaging of the abdomen and pelvis was performed following the standard protocol without IV contrast. RADIATION DOSE REDUCTION: This exam was performed according to the departmental dose-optimization program which includes automated exposure control, adjustment of the mA and/or kV according to patient size and/or use of iterative reconstruction technique. COMPARISON:  05/16/2023 CT abdomen/pelvis. FINDINGS: Lower  chest: Mild platelike bibasilar atelectasis. Subpleural 0.4 cm peripheral right lower lobe pulmonary nodule (series 4/image 8), decreased from 0.6 cm. Pacemaker leads partially visualized in the right atrium and right ventricle. Ovoid 1.1 cm hyperdense structure in the lower thoracic esophagus. Hepatobiliary: Top-normal liver size. Simple 1.1 cm lateral segment left liver cyst with additional scattered subcentimeter hypodense liver lesions are too small to characterize and unchanged. No new liver lesions. Cholecystectomy no biliary ductal dilatation. Pancreas: Normal, with no mass or duct dilation. Spleen: Normal size. No mass. Adrenals/Urinary Tract: Normal adrenals. No hydronephrosis. No renal stones. Simple 3.0 cm exophytic lateral lower left renal cyst, for which no follow-up imaging is recommended. Normal bladder. Stomach/Bowel: Normal non-distended stomach. 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 No dilated or thick-walled small bowel loops. Appendix not discretely visualized. Mobile cecum located in the anterior abdomen along the midline. Retained enteric dense contrast in the collapsed Hartmann pouch. Right-sided surgical drains terminate in the left pericolic gutter and right lower quadrant, unchanged. Small 3.5 x 1.7 cm postoperative collection in the posterior right lower peritoneal cavity (series 3/image 47), previously 3.8 x 2.2 cm, slightly decreased. No new fluid collections. Vascular/Lymphatic: Atherosclerotic nonaneurysmal abdominal aorta. No pathologically enlarged lymph nodes in the abdomen or pelvis. Reproductive: Status post hysterectomy, with no abnormal findings at the vaginal cuff. No adnexal mass. Other: No pneumoperitoneum. No ascites. Scattered fat stranding and ill-defined fluid in the midline ventral abdominal laparotomy site without measurable fluid collection. Musculoskeletal: No aggressive appearing focal  osseous lesions. IMPRESSION: 1. 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 2. Small postoperative collection in the posterior right lower peritoneal cavity, slightly decreased. No new fluid collections. 3. Scattered fat stranding and ill-defined fluid in the midline ventral abdominal laparotomy site without measurable fluid collection. 4. Ovoid 1.1 cm hyperdense structure in the lower thoracic esophagus, cannot exclude a ingested foreign body, either retained oral contrast or pill fragment. 5. Subpleural 0.4 cm peripheral right lower lobe pulmonary nodule, decreased from 0.6 cm, favoring a benign etiology. 6.  Aortic Atherosclerosis (ICD10-I70.0). Electronically Signed   By: Delbert Phenix M.D.   On: 05/23/2023 11:37   DG  Abd Portable 1V Result Date: 05/19/2023 CLINICAL DATA:  Abdominal pain EXAM: PORTABLE ABDOMEN - 1 VIEW COMPARISON:  05/16/2023 CT FINDINGS: Scattered large and small bowel gas is noted. Left lower quadrant ostomy is seen. Contrast is noted within the rectal stump. Multiple surgical drains are seen in place. No free air is seen. IMPRESSION: No acute abnormality noted. Electronically Signed   By: Alcide Clever M.D.   On: 05/19/2023 19:02   CT ABDOMEN PELVIS W CONTRAST Result Date: 05/16/2023 CLINICAL DATA:  Abdominal pain.  Postop. EXAM: CT ABDOMEN AND PELVIS WITH CONTRAST TECHNIQUE: Multidetector CT imaging of the abdomen and pelvis was performed using the standard protocol following bolus administration of intravenous contrast. RADIATION DOSE REDUCTION: This exam was performed according to the departmental dose-optimization program which includes automated exposure control, adjustment of the mA and/or kV according to patient size and/or use of iterative reconstruction technique. CONTRAST:  75mL OMNIPAQUE IOHEXOL 350 MG/ML SOLN COMPARISON:  05/09/2023. FINDINGS: Lower chest: Mild dependent atelectasis, left greater than  right, left improved from the prior CT. Hepatobiliary: Normal liver size and overall attenuation. Scattered small cysts, stable. Prior cholecystectomy. No bile duct dilation. Pancreas: Unremarkable. Spleen: Normal. Adrenals/Urinary Tract: No adrenal mass. Stable lower pole left renal cyst, with no follow-up recommended. Kidneys with symmetric enhancement and excretion. No hydronephrosis. Ureters normal in course and in caliber. Bladder mildly distended, unremarkable. Stomach/Bowel: Status post left colon resection, formation of a Hartmann's pouch and a left lower quadrant colostomy. Postsurgical lung inflammatory changes are noted in the low abdomen and pelvis with associated surgical drains, stable from the previous CT. Small collection lies superior and to the right of the Hartmann's pouch measuring 3.8 x 2.2 cm, 3.6 x 1.6 cm on the prior CT. There are no other defined collections. Small-bowel dilation with air-fluid levels, with less distension than on the prior CT. No small bowel wall thickening. No colonic dilation, wall thickening or inflammation. Vascular/Lymphatic: Aortic atherosclerosis. No aneurysm. No enlarged lymph nodes. Reproductive: Status post hysterectomy. Other: No free air.  Open midline incision to the fascia. Musculoskeletal: No acute findings or change from the prior CT. IMPRESSION: 1. Postsurgical changes related to left colon resection, formation of a left lower quadrant colostomy and formation of a Hartmann's pouch. Small collection adjacent to the pouch is minimally larger than on the prior CT. No other defined collections. 2. Small-bowel dilation with air-fluid levels, consistent with an ileus, mildly improved from the prior CT. 3. Aortic atherosclerosis. Aortic Atherosclerosis (ICD10-I70.0). Electronically Signed   By: Amie Portland M.D.   On: 05/16/2023 13:00   DG Abd Portable 1V Result Date: 05/14/2023 CLINICAL DATA:  77 year old female with abdominal pain and vomiting. Status post  abdominal surgery, descending colostomy. EXAM: PORTABLE ABDOMEN - 1 VIEW COMPARISON:  Portable chest and CT Abdomen and Pelvis 05/09/2023. FINDINGS: Portable AP supine views at 0330 hours. Stable lung bases. Cardiac pacemaker leads again noted. Retrocardiac hypo ventilation on going. Enteric tube has been removed since 05/09/2023. Gas-filled small and large bowel loops (transverse colon). The small bowel is less dilated compared to 05/09/2023, but not yet normal. Similar volume of gas in the stomach. Left lower quadrant ostomy is evident. Abdominal drain remains in place across the lower abdomen and pelvis. No pneumoperitoneum is apparent on this supine view. Stable cholecystectomy clips. Stable visualized osseous structures. IMPRESSION: 1. Improved but not normalized bowel gas pattern since 05/09/2023. Pattern most resembles ileus. 2. Lower abdominal, enteric tube removed. Lower abdominal, pelvic drain remains in place. 3. Left  lung base hypo ventilation. Electronically Signed   By: Odessa Fleming M.D.   On: 05/14/2023 04:27   DG Abd Portable 1V Result Date: 05/09/2023 CLINICAL DATA:  [id number] with NG tube placement. EXAM: PORTABLE ABDOMEN - 1 VIEW COMPARISON:  CT earlier today at 2:01 a.m. FINDINGS: Similar to the CT, injury to curves to the left in the stomach with the tip in the proximal body of stomach, well intragastric. Small bowel dilatation up to 4 cm in the upper abdomen continues to be seen. Left pleural effusion and overlying left lower lobe consolidation continue to obscure the hemidiaphragm. Left chest pacing system and wire insertions, right PICC stable with tip at the superior cavoatrial junction. IMPRESSION: 1. NG tube tip in the proximal body of stomach, well intragastric. 2. Small bowel dilatation up to 4 cm in the upper abdomen continues to be seen. 3. Left pleural effusion and overlying left lower lobe consolidation. Electronically Signed   By: Almira Bar M.D.   On: 05/09/2023 21:15   CT  ABDOMEN PELVIS W CONTRAST Result Date: 05/09/2023 CLINICAL DATA:  Abdominal pain EXAM: CT ABDOMEN AND PELVIS WITH CONTRAST TECHNIQUE: Multidetector CT imaging of the abdomen and pelvis was performed using the standard protocol following bolus administration of intravenous contrast. RADIATION DOSE REDUCTION: This exam was performed according to the departmental dose-optimization program which includes automated exposure control, adjustment of the mA and/or kV according to patient size and/or use of iterative reconstruction technique. CONTRAST:  75mL OMNIPAQUE IOHEXOL 350 MG/ML SOLN COMPARISON:  05/03/2023 FINDINGS: Lower chest: Left lower lobe collapse. Dual-chamber pacer leads in the right heart which are unremarkable where covered. Hepatobiliary: Small cystic densities in the upper central and left lobe liver. No worrisome liver abnormality. Cholecystectomy without biliary dilatation.No evidence of biliary obstruction or stone. Pancreas: Unremarkable. Spleen: Unremarkable. Adrenals/Urinary Tract: Negative adrenals. No hydronephrosis or stone. 2.8 cm left renal cyst at the lower pole. Unremarkable bladder. Stomach/Bowel: Descending colostomy and Hartmann's pouch. Lax ileocolic mesentery which is in the left abdomen. Multiple loops of small bowel are distended with fluid levels. Inflammation is seen in the operative region with surgical drains. Superior and rightward from the stump of the Hartmann's pouch is a fluid collection measuring 3.6 x 1.6 cm, sterility indeterminate. Vascular/Lymphatic: No acute vascular abnormality. Atheromatous calcification of the aorta and iliacs. No mass or adenopathy. Reproductive:Hysterectomy Other: No ascites or pneumoperitoneum. Musculoskeletal: Lumbar spine degeneration without acute finding. IMPRESSION: 1. Mild small bowel ileus. 2. Expected degree of inflammation in the operative region. Small fluid collection near the colonic stump, 3.5 x 1.6 cm. Electronically Signed   By:  Tiburcio Pea M.D.   On: 05/09/2023 04:31   DG Abd 1 View Result Date: 05/07/2023 CLINICAL DATA:  Abdominal pain EXAM: ABDOMEN - 1 VIEW COMPARISON:  05/03/2023 FINDINGS: Supine frontal view of the abdomen and pelvis excludes the hemidiaphragms and left flank by collimation. Enteric catheter tip and side port project over the gastric body. Surgical drains are seen projecting over the lower pelvis. There is progressive gaseous distention of the bowel, with small-bowel loops measuring up to 6 cm within the central abdomen. No abdominal masses or abnormal calcifications. IMPRESSION: 1. Progressive gaseous distention of the small bowel concerning for obstruction or worsening postoperative ileus. Electronically Signed   By: Sharlet Salina M.D.   On: 05/07/2023 12:37   DG CHEST PORT 1 VIEW Result Date: 05/04/2023 CLINICAL DATA:  Abdominal pain and back pain EXAM: PORTABLE CHEST 1 VIEW COMPARISON:  05/03/2023 abdominal CT. FINDINGS:  Dual-chamber pacer leads in unremarkable position. An enteric tube at least reaches the stomach. Endotracheal tube with tip just below the clavicular heads. Right PICC with tip at the SVC. Low volume chest with indistinct density at the bases, greater on the left. No effusion or pneumothorax. Extensive artifact from EKG leads. IMPRESSION: 1. Unremarkable hardware positioning. 2. Low volume chest with left more than right lower lobe atelectasis as seen on abdominal CT from yesterday. Electronically Signed   By: Tiburcio Pea M.D.   On: 05/04/2023 09:32   CT ABDOMEN PELVIS W CONTRAST Result Date: 05/03/2023 CLINICAL DATA:  Abdominal pain, postop EXAM: CT ABDOMEN AND PELVIS WITH CONTRAST TECHNIQUE: Multidetector CT imaging of the abdomen and pelvis was performed using the standard protocol following bolus administration of intravenous contrast. RADIATION DOSE REDUCTION: This exam was performed according to the departmental dose-optimization program which includes automated exposure  control, adjustment of the mA and/or kV according to patient size and/or use of iterative reconstruction technique. CONTRAST:  75mL OMNIPAQUE IOHEXOL 350 MG/ML SOLN COMPARISON:  04/22/2023 FINDINGS: Lower chest: Cardiomegaly. Pacer wires in the right heart. Trace left pleural effusion. Bibasilar airspace opacities most compatible with atelectasis. Hepatobiliary: Prior cholecystectomy. Scattered subpleural hypodensities in the liver are stable since prior study most compatible with cysts. No suspicious hepatic abnormality or biliary ductal dilatation. Pancreas: No focal abnormality or ductal dilatation. Spleen: No focal abnormality.  Normal size. Adrenals/Urinary Tract: Adrenal glands normal. Simple cyst in the lower pole of the left kidney measures 2.8 cm. No follow-up imaging recommended. No stones or hydronephrosis. Urinary bladder grossly unremarkable. Stomach/Bowel: Postoperative changes with left lower quadrant ostomy and Hartmann's pouch. NG tube is in the stomach. Stomach and small bowel mildly dilated into the pelvis. This could reflect postoperative ileus or small-bowel obstruction. Vascular/Lymphatic: Aortic atherosclerosis. No evidence of aneurysm or adenopathy. Reproductive: Prior hysterectomy.  No adnexal masses. Other: Stranding in the pelvis around the Hartmann's pouch and remaining sigmoid colon, similar to prior study. No drainable focal fluid collection. No free air. Open midline incision. Musculoskeletal: No acute bony abnormality. IMPRESSION: Postoperative changes with left lower quadrant ostomy. Stomach is mildly dilated with small bowel dilated into the pelvis. Pelvic small bowel loops are decompressed. This could reflect postoperative ileus or distal small bowel obstruction. Stranding around the Hartmann's pouch and sigmoid colon, similar to preoperative study. This could reflect residual inflammation or postoperative changes. No drainable fluid collection. Trace left pleural effusion.   Bibasilar atelectasis. Electronically Signed   By: Charlett Nose M.D.   On: 05/03/2023 17:13   DG Abd Portable 1V Result Date: 05/03/2023 CLINICAL DATA:  77 year old female enteric tube placement. Postoperative day 6 status post exploratory laparotomy with sigmoid colectomy. EXAM: PORTABLE ABDOMEN - 1 VIEW COMPARISON:  05/01/2023 and earlier. FINDINGS: Portable AP supine view at 0850 hours. The enteric tube has been pulled back, side hole is now at the level of the esophagus. The tube tip is at the GEJ. Stable cholecystectomy clips. Continued gas-filled and dilated small bowel loops in the visible abdomen. Continued featureless appearance large bowel gas in the left abdomen. Left lung base opacity. Cardiac pacer leads. IMPRESSION: 1. Enteric tube has been pulled back with tip now at the GEJ. Advance 8 cm to ensure side hole placement within the stomach. 2. Persistent abnormal visible bowel-gas pattern compatible with bowel obstruction or ileus. 3. Left lung base opacity, consider pleural effusion and atelectasis. Electronically Signed   By: Odessa Fleming M.D.   On: 05/03/2023 09:26   DG  Abd Portable 1V Result Date: 05/01/2023 CLINICAL DATA:  77 year old female NG tube placement. Postoperative day 4 status post exploratory laparotomy with sigmoid colectomy, colostomy. EXAM: PORTABLE ABDOMEN - 1 VIEW COMPARISON:  Abdomen radiographs yesterday and earlier. FINDINGS: Portable AP supine view at 0741 hours. Enteric tube terminates in the stomach, side hole the level of the gastric body. Ongoing dilated small bowel loops containing gas, up to 45 mm diameter. No significant change from yesterday. Partially visible pelvic drain. Stable cholecystectomy clips. IMPRESSION: 1. Enteric tube terminates in the stomach. 2. Persistent small bowel obstruction or ileus, no significant change from yesterday. 3. Pelvic drain(s). Electronically Signed   By: Odessa Fleming M.D.   On: 05/01/2023 08:18   DG Abd Portable 1V Result Date:  04/30/2023 CLINICAL DATA:  Nasogastric tube placement. EXAM: PORTABLE ABDOMEN - 1 VIEW COMPARISON:  Radiograph earlier today FINDINGS: Tip of the enteric tube below the diaphragm in the stomach, the side port is in the region of the gastroesophageal junction. Gaseous small bowel distension centrally is similar to earlier today. IMPRESSION: Tip of the enteric tube below the diaphragm in the stomach, side port in the region of the gastroesophageal junction. Recommend advancement. Electronically Signed   By: Narda Rutherford M.D.   On: 04/30/2023 20:55   DG Abd Portable 1V Result Date: 04/30/2023 CLINICAL DATA:  Postsurgical ileus following laparotomy and sigmoid colon resection on 04/27/2023. EXAM: PORTABLE ABDOMEN - 1 VIEW COMPARISON:  Abdomen and pelvis CT dated 04/22/2023. FINDINGS: Multiple moderately dilated loops of small bowel. Possible rectal tube in place with a partially collapsed balloon. Cholecystectomy clips. Lower thoracic and mild lumbar spine degenerative changes. Left lower quadrant ostomy. Linear and patchy consolidation at the left lung base. Minimal patchy density at the right lung base. IMPRESSION: 1. Multiple moderately dilated loops of small bowel, most consistent with postoperative ileus. 2. Left greater than right bibasilar atelectasis or pneumonia. Electronically Signed   By: Beckie Salts M.D.   On: 04/30/2023 11:50   [place] EKG SITE RITE Result Date: 04/30/2023 If Site Rite image not attached, placement could not be confirmed due to current cardiac rhythm.   Micro Results     No results found for this or any previous visit (from the past 240 hours).  Today   Subjective    Kathleen [name] today has no headache,no chest abdominal pain,no new weakness tingling or numbness, feels much better    Objective   Blood pressure 103/65, pulse 70, temperature 97.6 F (36.4 C), temperature source Oral, resp. rate 14, height 5' (1.524 m), weight 86.4 kg, SpO2 98%.   Intake/Output  Summary (Last 24 hours) at 05/27/2023 0726 Last data filed at 05/26/2023 2300 Gross per 24 hour  Intake --  Output 200 ml  Net -200 ml    Exam  Awake Alert, No new F.N deficits,    [place].AT,PERRAL Supple Neck,   Symmetrical Chest wall movement, Good air movement bilaterally, CTAB RRR,No Gallops,   +ve B.Sounds, Abd Soft, Non tender, Colostmy in place with liquid stool trace edema    Data Review   Recent Labs  Lab 05/23/23 0426 05/24/23 0322 05/25/23 0337 05/26/23 0318 05/27/23 0316  WBC 9.9 10.7* 13.2* 10.9* 10.6*  HGB 9.2* 9.3* 9.0* 9.0* 8.8*  HCT 29.4* 30.2* 29.2* 30.0* 28.8*  PLT 272 296 285 309 300  MCV 82.8 83.2 83.4 85.0 84.5  MCH 25.9* 25.6* 25.7* 25.5* 25.8*  MCHC 31.3 30.8 30.8 30.0 30.6  RDW 16.6* 16.4* 15.9* 16.5* 16.5*  LYMPHSABS  --  1.1 0.9 1.2 1.1  MONOABS  --  0.9 0.7 0.8 0.8  EOSABS  --  0.8* 0.0 0.6* 0.8*  BASOSABS  --  0.1 0.1 0.1 0.1    Recent Labs  Lab 05/21/23 0313 05/22/23 0449 05/23/23 0426 05/24/23 0322 05/24/23 0524 05/26/23 0318 05/27/23 0316  NA 131* 129* 132* 130*  --  136 134*  K 3.9 4.1 3.8 3.8  --  4.8 4.5  CL 94* 95* 91* 93*  --  100 98  CO2 29 28 27 28   --  25 27  ANIONGAP 8 6 14 9   --  11 9  GLUCOSE 213* 193* 206* 214*  --  136* 160*  BUN 28* 36* 37* 37*  --  23 23  CREATININE 0.54 0.57 0.62 0.65  --  0.57 0.82  AST  --   --   --  12*  --   --  14*  ALT  --   --   --  9  --   --  10  ALKPHOS  --   --   --  57  --   --  61  BILITOT  --   --   --  0.5  --   --  0.5  ALBUMIN  --   --   --  2.7*  --   --  2.6*  CRP  --   --   --   --  1.3* 1.1* 1.7*  PROCALCITON  --   --   --   --  <0.10 <0.10 <0.10  BNP  --   --   --   --   --  198.9* 190.0*  MG 1.7 2.1  --  1.9  --  1.9 1.8  CALCIUM 9.9 9.3 10.1 10.0  --  10.3 10.1    Total Time in preparing paper work, data evaluation and todays exam - 35 minutes  Signature  -    Susa Raring M.D on 05/27/2023 at 7:26 AM   -  To page go to [email or web address]

## 2023-05-27 NOTE — TOC Transition Note (Signed)
Transition of Care [facility]) - Discharge Note   Patient Details  Name: Kathleen [name] MRN: [id number] Date of Birth: 1946/03/21  Transition of Care [facility]) CM/SW Contact:  Erin Sons, LCSW Phone Number: 05/27/2023, 10:15 AM   Clinical Narrative:     Per MD patient ready for DC to [facility]. RN, patient, patient's family, and facility notified of DC. Discharge Summary and FL2 sent to facility. RN to call report prior to discharge [phone] room [phone]). DC packet on chart. Ambulance transport requested for patient.   CSW will sign off for now as social work intervention is no longer needed. Please consult [place] again if new needs arise.   Final next level of care: Long Term Acute Care (LTAC) Barriers to Discharge: No Barriers Identified   Patient Goals and CMS Choice Patient states their goals for this hospitalization and ongoing recovery are:: Rehab CMS Medicare.gov Compare Post Acute Care list provided to:: Patient Choice offered to / list presented to : Patient [place] ownership interest in [facility].provided to:: Patient    Discharge Placement              Patient chooses bed at:  ([facility]) Patient to be transferred to facility by: [facility] Name of family member notified: Son [name] Patient and family notified of of transfer: 05/27/23  Discharge Plan and Services Additional resources added to the After Visit Summary for   In-house Referral: Clinical Social Work   Post Acute Care Choice: Skilled Nursing Facility                               Social Drivers of Health (SDOH) Interventions SDOH Screenings   Food Insecurity: No Food Insecurity (04/22/2023)  Housing: Low Risk  (04/22/2023)  Transportation Needs: No Transportation Needs (04/22/2023)  Utilities: Not At Risk (04/22/2023)  Alcohol Screen: Low Risk  (05/21/2022)  Depression (PHQ2-9): Low Risk  (05/21/2022)  Financial Resource Strain: Low Risk  (05/21/2022)  Physical Activity:  Insufficiently Active (05/21/2022)  Social Connections: Moderately Isolated (05/21/2022)  Stress: No Stress Concern Present (05/21/2022)  Tobacco Use: Medium Risk (05/24/2023)     Readmission Risk Interventions    05/18/2023   12:10 PM 04/26/2023    9:51 AM 01/14/2023   11:16 AM  Readmission Risk Prevention Plan  Transportation Screening Complete Complete Complete  PCP or Specialist Appt within 5-7 Days   Complete  PCP or Specialist Appt within 3-5 Days  Complete   Home Care Screening   Complete  Medication Review (RN CM)   Complete  HRI or Home Care Consult  Complete   Palliative Care Screening  --   Medication Review (RN Care Manager) Complete Complete   PCP or Specialist appointment within 3-5 days of discharge Complete    HRI or Home Care Consult Complete    SW Recovery Care/Counseling Consult Complete    Palliative Care Screening Not Applicable    Skilled Nursing Facility Complete

## 2023-05-27 NOTE — Plan of Care (Signed)
  Problem: Education: Goal: Ability to describe self-care measures that may prevent or decrease complications (Diabetes Survival Skills Education) will improve Outcome: Progressing Goal: Individualized Educational Video(s) Outcome: Progressing   Problem: Coping: Goal: Ability to adjust to condition or change in health will improve Outcome: Progressing   Problem: Fluid Volume: Goal: Ability to maintain a balanced intake and output will improve Outcome: Progressing   Problem: Health Behavior/Discharge Planning: Goal: Ability to identify and utilize available resources and services will improve Outcome: Progressing Goal: Ability to manage health-related needs will improve Outcome: Progressing   Problem: Metabolic: Goal: Ability to maintain appropriate glucose levels will improve Outcome: Progressing   Problem: Nutritional: Goal: Maintenance of adequate nutrition will improve Outcome: Progressing Goal: Progress toward achieving an optimal weight will improve Outcome: Progressing   Problem: Skin Integrity: Goal: Risk for impaired skin integrity will decrease Outcome: Progressing   Problem: Tissue Perfusion: Goal: Adequacy of tissue perfusion will improve Outcome: Progressing   Problem: Education: Goal: Knowledge of General Education information will improve Description: Including pain rating scale, medication(s)/side effects and non-pharmacologic comfort measures Outcome: Progressing   Problem: Health Behavior/Discharge Planning: Goal: Ability to manage health-related needs will improve Outcome: Progressing   Problem: Clinical Measurements: Goal: Ability to maintain clinical measurements within normal limits will improve Outcome: Progressing Goal: Will remain free from infection Outcome: Progressing Goal: Diagnostic test results will improve Outcome: Progressing Goal: Cardiovascular complication will be avoided Outcome: Progressing   Problem: Activity: Goal: Risk  for activity intolerance will decrease Outcome: Progressing   Problem: Nutrition: Goal: Adequate nutrition will be maintained Outcome: Progressing   Problem: Coping: Goal: Level of anxiety will decrease Outcome: Progressing   Problem: Elimination: Goal: Will not experience complications related to bowel motility Outcome: Progressing Goal: Will not experience complications related to urinary retention Outcome: Progressing   Problem: Pain Management: Goal: General experience of comfort will improve Outcome: Progressing   Problem: Safety: Goal: Ability to remain free from injury will improve Outcome: Progressing   Problem: Skin Integrity: Goal: Risk for impaired skin integrity will decrease Outcome: Progressing   Problem: Education: Goal: Understanding of discharge needs will improve Outcome: Progressing Goal: Verbalization of understanding of the causes of altered bowel function will improve Outcome: Progressing   Problem: Activity: Goal: Ability to tolerate increased activity will improve Outcome: Progressing   Problem: Bowel/Gastric: Goal: Gastrointestinal status for postoperative course will improve Outcome: Progressing   Problem: Health Behavior/Discharge Planning: Goal: Identification of community resources to assist with postoperative recovery needs will improve Outcome: Progressing   Problem: Nutritional: Goal: Will attain and maintain optimal nutritional status will improve Outcome: Progressing   Problem: Clinical Measurements: Goal: Postoperative complications will be avoided or minimized Outcome: Progressing   Problem: Respiratory: Goal: Respiratory status will improve Outcome: Progressing   Problem: Skin Integrity: Goal: Will show signs of wound healing Outcome: Progressing

## 2023-05-27 NOTE — Consult Note (Signed)
Value-Based Care [facility] Liaison Consult Note    05/27/2023  TRACA GILLOCK 12-Sep-1945 [id number]  Covering Charlesetta Shanks, RN [facility] hospital liaison)  Update: Pt discharged to [facility] facility and facility will continue to address pt's needs.   VBCI liaison will collaborate with the involved team for case closer due to current disposition.  Of note, Care Management services does not replace or interfere with any services that are needed or arranged by inpatient [facility] care management team.   For additional questions or referrals please contact:    Elliot Cousin, RN, [facility] Liaison [place]   [facility], Population Health Office Hours MTWF  8:00 am-6:00 pm Direct Dial: [phone] mobile [phone] [Office toll free line] Office Hours are M-F 8:30 - 5 pm [email or web address]

## 2023-05-27 NOTE — Progress Notes (Signed)
PICC removal order placed for patient discharge. Line removed without difficulty. Return precautions explained. Pressure dressing applied; patient to remain in bed for 30 minutes. Dressing ok to be removed in 24-48 hours.   Takina Busser Loyola Mast, RN

## 2023-06-02 DIAGNOSIS — B3781 Candidal esophagitis: Secondary | ICD-10-CM | POA: Diagnosis not present

## 2023-06-02 DIAGNOSIS — R531 Weakness: Secondary | ICD-10-CM | POA: Diagnosis not present

## 2023-06-02 DIAGNOSIS — R110 Nausea: Secondary | ICD-10-CM | POA: Diagnosis not present

## 2023-06-05 DIAGNOSIS — R531 Weakness: Secondary | ICD-10-CM | POA: Diagnosis not present

## 2023-06-05 DIAGNOSIS — R110 Nausea: Secondary | ICD-10-CM | POA: Diagnosis not present

## 2023-06-05 DIAGNOSIS — B3781 Candidal esophagitis: Secondary | ICD-10-CM | POA: Diagnosis not present

## 2023-06-06 DIAGNOSIS — B3781 Candidal esophagitis: Secondary | ICD-10-CM | POA: Diagnosis not present

## 2023-06-06 DIAGNOSIS — R531 Weakness: Secondary | ICD-10-CM | POA: Diagnosis not present

## 2023-06-06 DIAGNOSIS — R110 Nausea: Secondary | ICD-10-CM | POA: Diagnosis not present

## 2023-06-08 ENCOUNTER — Telehealth: Payer: Self-pay | Admitting: Internal Medicine

## 2023-06-08 NOTE — Telephone Encounter (Signed)
 Copied from CRM [id number]. Topic: General - Call Back - No Documentation >> Jun 08, 2023 10:12 AM Fredrich Romans wrote: Reason for CRM: Patient returned a call to [facility].Theres no documentation of the call.She would like a return call back

## 2023-06-14 ENCOUNTER — Telehealth: Payer: Self-pay | Admitting: *Deleted

## 2023-06-14 ENCOUNTER — Encounter: Payer: Self-pay | Admitting: Licensed Clinical Social Worker

## 2023-06-14 NOTE — Patient Outreach (Signed)
  Care Coordination   Collaboration  Visit Note   06/14/2023 Name: Kathleen [name] MRN: [id number] DOB: 29-Nov-1945  Kathleen [name] is a 78 y.o. year old female who sees Joshua Debby CROME, MD for primary care. I  engaged with hospital liaison via in-basket message  What matters to the patients health and wellness today?  Patient was not contacted during this encounter    Goals Addressed             This Visit's Progress    COMPLETED: Obtain Supportive Resources-Housing       Activities and task to complete in order to accomplish goals.   Keep all upcoming appointments discussed today Continue with compliance of taking medication prescribed by Doctor Implement healthy coping skills discussed to assist with management of symptoms Use housing resources/websites discussed to review local options         SDOH assessments and interventions completed:  No     Care Coordination Interventions:  Yes, provided  Interventions Today    Flowsheet Row Most Recent Value  General Interventions   General Interventions Discussed/Reviewed Communication with  Communication with RN  Oval Liason shared pt discharged to [facility]  on 12/19.]       Follow up plan: No further intervention required.   Encounter Outcome:  Patient Visit Completed   Rolin Kerns, MSW, LCSW [facility]  [facility] [email or web address] Phone [phone] 8:43 AM

## 2023-06-14 NOTE — Progress Notes (Signed)
 Complex Care Management Care Guide Note  06/14/2023 Name: GERRIE CASTIGLIA MRN: [id number] DOB: 07/18/1945  AME HEAGLE is a 78 y.o. year old female who is a primary care patient of [name], MD and is actively engaged with the care management team. I reached out to [name] by phone today to assist with re-scheduling  with the RN Case Manager.  Follow up plan: Unsuccessful telephone outreach attempt made. A HIPAA compliant phone message was left for the patient providing contact information and requesting a return call.  Burman Nieves, CCMA Care Coordination Care Guide Direct Dial: [phone]

## 2023-06-15 NOTE — Progress Notes (Signed)
 Complex Care Management Care Guide Note  06/15/2023 Name: Kathleen [name] MRN: [id number] DOB: 1945-12-02  Kathleen [name] is a 78 y.o. year old female who is a primary care patient of [name], MD and is actively engaged with the care management team. I reached out to [name] by phone today to assist with re-scheduling  with the RN Case Manager.  Follow up plan: pt is in rehab for the next 3-4 weeks, agreeable to callback next month - will f/u   Burman Nieves, [facility] Care Coordination Care Guide Direct Dial: [phone]

## 2023-06-28 ENCOUNTER — Encounter (HOSPITAL_COMMUNITY): Payer: Self-pay | Admitting: Cardiology

## 2023-06-29 ENCOUNTER — Encounter: Payer: Self-pay | Admitting: Internal Medicine

## 2023-06-29 ENCOUNTER — Telehealth: Payer: Self-pay

## 2023-06-29 ENCOUNTER — Ambulatory Visit: Payer: Medicare Other | Attending: Cardiology

## 2023-06-29 DIAGNOSIS — Z950 Presence of cardiac pacemaker: Secondary | ICD-10-CM

## 2023-06-29 DIAGNOSIS — I5022 Chronic systolic (congestive) heart failure: Secondary | ICD-10-CM

## 2023-06-29 DIAGNOSIS — Z9049 Acquired absence of other specified parts of digestive tract: Secondary | ICD-10-CM | POA: Insufficient documentation

## 2023-06-29 NOTE — Progress Notes (Unsigned)
Subjective:    Patient ID: Kathleen [name], female    DOB: 1946/04/11, 78 y.o.   MRN: [id number]     HPI Kathleen [name] is here for follow up from the hospital   Admitted 11/15 - 12/16  She has history of diverticulitis associated with colovaginal fistula.  She completed several rounds of antibiotics and was referred to the ED by GI  after outpatient CT showed worsening diverticulitis with localized perforation and pelvic abscess.  She was admitted and started on empiric IV antibiotics.  She had preop assessment by cardiology and subsequently underwent exploratory laparotomy with sigmoid colectomy and end ostomy on 11/19.  Postop course complicated by ileus requiring NG tube decompression and TNA for nutrition.  CT 12/15-s/p partial distal colectomy with end colostomy and ventral left abdomen with Hartman's pouch.  Small postop collection in posterior right lower peritoneal cavity, slightly decreased.  No new fluid collections.  Avoid 1.1 cm hyperdense structure in the left thoracic esophagus-cannot exclude an ingested foreign body or retained contrast or pill fragment.  Normocytic anemia-secondary to critical illness.  No evidence of bleeding.  S/p transfusion 2 unit PRBC on 12/10. EGD 05/24/2023-esophageal plaques consistent with candidiasis.  Was discharged to rehab and then discharged home 1/20.  She has a follow-up appointment with surgery 1/30.  Nurse came out yesterday - wound - abd wound - 1 cm - improving.  Some of it came out.  Nurse comes out Friday.    Has PT, OT-has not started yet.  Feet - very dry -she also has neuropathy which she thinks may be getting worse needs a poidatrist  Has wound on her abdomen that she needs the dressing change.  The nurse came yesterday and dressed it, but did not have the right dressings and her dressing is coming off so would like it changed.  Medications and allergies reviewed with patient and updated if appropriate.  Current Outpatient  Medications on File Prior to Visit  Medication Sig Dispense Refill   acetaminophen (TYLENOL) 500 MG tablet Take 1,000 mg by mouth as needed.     albuterol (PROVENTIL) (2.5 MG/3ML) 0.083% nebulizer solution USE 3 ML VIA NEBULIZER THREE TIMES DAILY     bumetanide (BUMEX) 2 MG tablet Take 2 mg by mouth daily.     Cholecalciferol (VITAMIN D3) 50 MCG (2000 UT) TABS Take 2,000 Units by mouth at bedtime.     cyanocobalamin (VITAMIN B12) 1000 MCG/ML injection Inject 1 mL (1,000 mcg total) into the muscle every 30 (thirty) days. 10 mL 0   docusate sodium (COLACE) 100 MG capsule Take 100 mg by mouth daily.     dofetilide (TIKOSYN) 250 MCG capsule Take 1 capsule (250 mcg total) by mouth in the morning and at bedtime. 60 capsule 6   ELIQUIS 5 MG TABS tablet Take 5 mg by mouth 2 (two) times daily.     febuxostat (ULORIC) 40 MG tablet TAKE 1 TABLET(40 MG) BY MOUTH DAILY (Patient taking differently: Take 40 mg by mouth at bedtime.) 90 tablet 0   fluticasone furoate-vilanterol (BREO ELLIPTA) 100-25 MCG/ACT AEPB INHALE 1 PUFF INTO THE LUNGS DAILY 60 each 11   insulin aspart (NOVOLOG) 100 UNIT/ML FlexPen Before each meal 3 times a day, 140-199 - 2 units, 200-250 - 4 units, 251-299 - 6 units,  300-349 - 8 units,  350 or above 10 units.     LANTUS SOLOSTAR 100 UNIT/ML Solostar Pen SMARTSIG:12 Unit(s) SUB-Q Every Morning     levalbuterol (XOPENEX)  0.63 MG/3ML nebulizer solution Take 3 mLs (0.63 mg total) by nebulization every 6 (six) hours as needed for wheezing or shortness of breath. DX J44.89 J96.21 3 mL 12   Magnesium 500 MG TABS Take 500 mg by mouth 2 (two) times daily.     methocarbamol (ROBAXIN) 500 MG tablet Take 500 mg by mouth 3 (three) times daily as needed.     metoCLOPramide (REGLAN) 10 MG tablet Take by mouth.     metroNIDAZOLE (FLAGYL) 500 MG tablet SMARTSIG:1 Tablet(s) By Mouth Every 12 Hours     mirtazapine (REMERON) 15 MG tablet Take 15 mg by mouth at bedtime.     Nutritional Supplements (,FEEDING  SUPPLEMENT, PROSOURCE PLUS) liquid Take 30 mLs by mouth 2 (two) times daily between meals.     nystatin (MYCOSTATIN/NYSTOP) powder Apply topically 3 (three) times daily.     ondansetron (ZOFRAN) 4 MG tablet Take 1 tablet (4 mg total) by mouth every 8 (eight) hours as needed for nausea or vomiting.     oxyCODONE (OXY IR/ROXICODONE) 5 MG immediate release tablet Take 1-2 tablets (5-10 mg total) by mouth every 6 (six) hours as needed for moderate pain (pain score 4-6) or severe pain (pain score 7-10) (5mg  moderate, 10mg  severe). 4 tablet 0   OXYGEN Inhale 4 L into the lungs continuous. Use with resmed ventilator     pantoprazole (PROTONIX) 40 MG tablet Take 1 tablet (40 mg total) by mouth daily. 30 tablet 0   potassium chloride (KLOR-CON) 10 MEQ tablet Take 40 mEq by mouth daily.     revefenacin (YUPELRI) 175 MCG/3ML nebulizer solution Take 175 mcg by nebulization daily.     rivaroxaban (XARELTO) 20 MG TABS tablet Take 1 tablet (20 mg total) by mouth daily with supper. 30 tablet 5   spironolactone (ALDACTONE) 25 MG tablet Take 1 tablet (25 mg total) by mouth 2 (two) times daily. 60 tablet 0   torsemide (DEMADEX) 20 MG tablet Take 2 tablets (40 mg total) by mouth daily. 60 tablet 0   Zinc Oxide (DESITIN MAXIMUM STRENGTH) 40 % PSTE Apply 1 Application topically 4 (four) times daily. Mix with aquaphor and apply to inflamed area     gabapentin (NEURONTIN) 100 MG capsule Take 2 capsules (200 mg total) by mouth at bedtime. 60 capsule 6   No current facility-administered medications on file prior to visit.     Review of Systems  Constitutional:  Positive for appetite change (worse, improving). Negative for chills and fever.  Respiratory:  Positive for shortness of breath and wheezing. Negative for cough.   Cardiovascular:  Positive for chest pain (intermittent - difficulty getting deep breathe) and leg swelling. Negative for palpitations.  Gastrointestinal:  Positive for abdominal pain (left lower side).   Neurological:  Positive for light-headedness and headaches.       Objective:   Vitals:   06/30/23 1018  BP: 132/84  Pulse: 86  Resp: 18  Temp: 97.9 F (36.6 C)  SpO2: 97%   BP Readings from Last 3 Encounters:  06/30/23 132/84  05/27/23 (!) 107/55  04/19/23 112/72   Wt Readings from Last 3 Encounters:  06/30/23 178 lb 4 oz (80.9 kg)  05/27/23 190 lb 7.6 oz (86.4 kg)  04/19/23 183 lb 2 oz (83.1 kg)   Body mass index is 34.81 kg/m.    Physical Exam Constitutional:      General: She is not in acute distress.    Appearance: Normal appearance.  HENT:     Head:  Normocephalic and atraumatic.  Eyes:     Conjunctiva/sclera: Conjunctivae normal.  Cardiovascular:     Rate and Rhythm: Normal rate and regular rhythm.     Heart sounds: Normal heart sounds.  Pulmonary:     Effort: Pulmonary effort is normal. No respiratory distress.     Breath sounds: Normal breath sounds. No wheezing or rhonchi.  Abdominal:     General: There is distension.     Tenderness: There is abdominal tenderness (mild, diffuse).     Comments: Wound abdomen that has some drainage-getting smaller-bandage changed-colostomy functioning properly  Musculoskeletal:     Cervical back: Neck supple.     Right lower leg: No edema.     Left lower leg: No edema.  Lymphadenopathy:     Cervical: No cervical adenopathy.  Skin:    General: Skin is warm and dry.     Findings: No rash.  Neurological:     Mental Status: She is alert. Mental status is at baseline.  Psychiatric:        Mood and Affect: Mood normal.        Behavior: Behavior normal.        Lab Results  Component Value Date   WBC 10.6 (H) 05/27/2023   HGB 8.8 (L) 05/27/2023   HCT 28.8 (L) 05/27/2023   PLT 300 05/27/2023   GLUCOSE 160 (H) 05/27/2023   CHOL 174 08/07/2021   TRIG 145 05/24/2023   HDL 35.00 (L) 08/07/2021   LDLCALC 99 08/07/2021   ALT 10 05/27/2023   AST 14 (L) 05/27/2023   NA 134 (L) 05/27/2023   K 4.5 05/27/2023   CL  98 05/27/2023   CREATININE 0.82 05/27/2023   BUN 23 05/27/2023   CO2 27 05/27/2023   TSH 2.213 02/22/2023   INR 1.7 (H) 04/22/2023   HGBA1C 6.2 (H) 04/23/2023   MICROALBUR 0.8 08/07/2021   CUP PACEART REMOTE DEVICE CHECK Scheduled remote reviewed. Normal device function.   HF diagnostics currently abnormal, followed by ICM Next remote 91 days. LA, CVRS    Assessment & Plan:    See Problem List for Assessment and Plan of chronic medical problems.   Peripheral neuropathy-feet, dry skin: Referral to podiatry

## 2023-06-29 NOTE — Telephone Encounter (Signed)
Remote ICM transmission received.  Attempted call to patient regarding ICM remote transmission but son answering phone stated she has a nurse at home providing an evaluation due to she was discharged from rehab 1/20.  Advised will cal back tomorrow.

## 2023-06-29 NOTE — Progress Notes (Unsigned)
EPIC Encounter for ICM Monitoring  Patient Name: Kathleen [name] is a 78 y.o. female Date: 06/29/2023 Primary Care Physican: [name], MD Primary Cardiologist: Bensimhon Electrophysiologist: Camnitz Bi-V Pacing: 100%          07/28/2022 Weight: 184.6 lbs 09/07/2022   Weight: 187 lbs 11/17/2022 Weight: 190-191 lbs    02/04/2023 Office: 194 lbs     03/16/2023 Weight: 190 lbs       03/23/2023 Weight: 188 lbs   04/06/2023 Weight: 176 lbs    06/30/2023 Weight: 178 lbs  Clinical Status  Since 13-Apr-2023 Treated AT/AF 9 AT/AF  16 Time in AT/AF  <0.1 hr/day (0.3%)     Therapy Summary AT/AF Pace-Terminated Episodes 6 of 9 (66.7%) on 06/16/23 and 06/03/23                                Spoke with patient and heart failure questions reviewed.  Transmission results reviewed.  Pt reports her feet are very swollen.  She reports a lot of med changes since discharge from rehab.     Hospitalized 11/14 followed by inpatient rehab.  Discharged from rehab on 1/20    Diet:  She lives in an assisted living  Eats foods that are prepared at the facility she lives in which are not low salt.     Optivol thoracic impedance suggesting possible fluid accumulation starting 1/3.   Prescribed:  Bumetanide 2 mg take 1 tablet by mouth daily (prescribed at rehab stay).  Not listed in Epic Spironolactone 25 mg take 1 tablet twice a day   Labs: 06/30/2023 BMET collected at PCP office.   05/27/2023 Creatinine 0.82, BUN 23, Potassium 4.5, Sodium 134, GFR >60  05/26/2023 Creatinine 0.57, BUN 23, Potassium 4.8, Sodium 136, GFR >60  05/24/2023 Creatinine 0.65, BUN 37, Potassium 3.8, Sodium 130, GFR >60  05/23/2023 Creatinine 0.62, BUN 37, Potassium 3.8, Sodium 132, GFR >60 05/22/2023 Creatinine 0.57, BUN 36, Potassium 4.1, Sodium 129, GFR >60  05/21/2023 Creatinine 0.54, BUN 28, Potassium 3.9, Sodium 131, GFR >60  A complete set of results can be found in Results Review.   Recommendations:  She had hospital  follow up with PCP today, 1/22.  Copy sent to Dr Reuben Likes for review and recommendations.     Follow-up plan: ICM clinic phone appointment on 07/05/2023 to recheck fluid levels.  91 day device clinic remote transmission 09/29/2023.     EP/Cardiology Office Visits:   Advised to call De Bensimhon's and Device clinic office to schedule appointments.   Recall 07/14/2023 with Dr Gala Romney.   Recall 05/13/2023 with Francis Dowse, PA or Otilio Saber, [place].     Copy of ICM check sent to Dr. Elberta Fortis.   3 month ICM trend: 06/30/2023.   12-14 Month ICM trend:     Karie Soda, RN 06/29/2023 12:59 PM

## 2023-06-30 ENCOUNTER — Ambulatory Visit: Payer: Medicare Other

## 2023-06-30 ENCOUNTER — Ambulatory Visit: Payer: Medicare Other | Admitting: Internal Medicine

## 2023-06-30 ENCOUNTER — Encounter: Payer: Self-pay | Admitting: Internal Medicine

## 2023-06-30 VITALS — BP 132/84 | HR 86 | Temp 97.9°F | Resp 18 | Ht 60.0 in | Wt 178.2 lb

## 2023-06-30 DIAGNOSIS — I5042 Chronic combined systolic (congestive) and diastolic (congestive) heart failure: Secondary | ICD-10-CM

## 2023-06-30 DIAGNOSIS — Z9049 Acquired absence of other specified parts of digestive tract: Secondary | ICD-10-CM | POA: Diagnosis not present

## 2023-06-30 DIAGNOSIS — E1140 Type 2 diabetes mellitus with diabetic neuropathy, unspecified: Secondary | ICD-10-CM

## 2023-06-30 DIAGNOSIS — E039 Hypothyroidism, unspecified: Secondary | ICD-10-CM | POA: Diagnosis not present

## 2023-06-30 DIAGNOSIS — I10 Essential (primary) hypertension: Secondary | ICD-10-CM

## 2023-06-30 DIAGNOSIS — I442 Atrioventricular block, complete: Secondary | ICD-10-CM | POA: Diagnosis not present

## 2023-06-30 DIAGNOSIS — G6289 Other specified polyneuropathies: Secondary | ICD-10-CM

## 2023-06-30 DIAGNOSIS — L853 Xerosis cutis: Secondary | ICD-10-CM

## 2023-06-30 DIAGNOSIS — N1831 Chronic kidney disease, stage 3a: Secondary | ICD-10-CM

## 2023-06-30 DIAGNOSIS — I4811 Longstanding persistent atrial fibrillation: Secondary | ICD-10-CM

## 2023-06-30 DIAGNOSIS — Z794 Long term (current) use of insulin: Secondary | ICD-10-CM

## 2023-06-30 LAB — CUP PACEART REMOTE DEVICE CHECK
Battery Remaining Longevity: 80 mo
Battery Voltage: 2.97 V
Brady Statistic AP VP Percent: 74.2 %
Brady Statistic AP VS Percent: 0.01 %
Brady Statistic AS VP Percent: 25.79 %
Brady Statistic AS VS Percent: 0 %
Brady Statistic RA Percent Paced: 73.97 %
Brady Statistic RV Percent Paced: 99.99 %
Date Time Interrogation Session: 20250122060312
Implantable Lead Connection Status: 753985
Implantable Lead Connection Status: 753985
Implantable Lead Implant Date: 20150527
Implantable Lead Implant Date: 20211221
Implantable Lead Location: 753858
Implantable Lead Location: 753860
Implantable Lead Model: 3830
Implantable Lead Model: 5076
Implantable Pulse Generator Implant Date: 20211221
Lead Channel Impedance Value: 247 Ohm
Lead Channel Impedance Value: 304 Ohm
Lead Channel Impedance Value: 323 Ohm
Lead Channel Impedance Value: 323 Ohm
Lead Channel Impedance Value: 342 Ohm
Lead Channel Impedance Value: 399 Ohm
Lead Channel Impedance Value: 418 Ohm
Lead Channel Impedance Value: 437 Ohm
Lead Channel Impedance Value: 456 Ohm
Lead Channel Pacing Threshold Amplitude: 0.625 V
Lead Channel Pacing Threshold Amplitude: 0.75 V
Lead Channel Pacing Threshold Amplitude: 0.75 V
Lead Channel Pacing Threshold Pulse Width: 0.4 ms
Lead Channel Pacing Threshold Pulse Width: 0.4 ms
Lead Channel Pacing Threshold Pulse Width: 0.4 ms
Lead Channel Sensing Intrinsic Amplitude: 1.25 mV
Lead Channel Sensing Intrinsic Amplitude: 1.25 mV
Lead Channel Sensing Intrinsic Amplitude: 6.25 mV
Lead Channel Sensing Intrinsic Amplitude: 6.25 mV
Lead Channel Setting Pacing Amplitude: 1.25 V
Lead Channel Setting Pacing Amplitude: 1.5 V
Lead Channel Setting Pacing Amplitude: 2 V
Lead Channel Setting Pacing Pulse Width: 0.4 ms
Lead Channel Setting Pacing Pulse Width: 0.4 ms
Lead Channel Setting Sensing Sensitivity: 2 mV
Zone Setting Status: 755011

## 2023-06-30 LAB — CBC WITH DIFFERENTIAL/PLATELET
Basophils Absolute: 0.1 10*3/uL (ref 0.0–0.1)
Basophils Relative: 1.1 % (ref 0.0–3.0)
Eosinophils Absolute: 0.4 10*3/uL (ref 0.0–0.7)
Eosinophils Relative: 3.4 % (ref 0.0–5.0)
HCT: 38.7 % (ref 36.0–46.0)
Hemoglobin: 12.3 g/dL (ref 12.0–15.0)
Lymphocytes Relative: 10.4 % — ABNORMAL LOW (ref 12.0–46.0)
Lymphs Abs: 1.1 10*3/uL (ref 0.7–4.0)
MCHC: 31.7 g/dL (ref 30.0–36.0)
MCV: 78.4 fL (ref 78.0–100.0)
Monocytes Absolute: 0.8 10*3/uL (ref 0.1–1.0)
Monocytes Relative: 7.7 % (ref 3.0–12.0)
Neutro Abs: 8.2 10*3/uL — ABNORMAL HIGH (ref 1.4–7.7)
Neutrophils Relative %: 77.4 % — ABNORMAL HIGH (ref 43.0–77.0)
Platelets: 323 10*3/uL (ref 150.0–400.0)
RBC: 4.93 Mil/uL (ref 3.87–5.11)
RDW: 18 % — ABNORMAL HIGH (ref 11.5–15.5)
WBC: 10.7 10*3/uL — ABNORMAL HIGH (ref 4.0–10.5)

## 2023-06-30 LAB — COMPREHENSIVE METABOLIC PANEL
ALT: 7 U/L (ref 0–35)
AST: 10 U/L (ref 0–37)
Albumin: 4.2 g/dL (ref 3.5–5.2)
Alkaline Phosphatase: 49 U/L (ref 39–117)
BUN: 16 mg/dL (ref 6–23)
CO2: 35 meq/L — ABNORMAL HIGH (ref 19–32)
Calcium: 10 mg/dL (ref 8.4–10.5)
Chloride: 97 meq/L (ref 96–112)
Creatinine, Ser: 0.69 mg/dL (ref 0.40–1.20)
GFR: 84.25 mL/min (ref >60.00)
Glucose, Bld: 118 mg/dL — ABNORMAL HIGH (ref 70–99)
Potassium: 3.4 meq/L — ABNORMAL LOW (ref 3.5–5.1)
Sodium: 142 meq/L (ref 135–145)
Total Bilirubin: 0.5 mg/dL (ref 0.2–1.2)
Total Protein: 6.7 g/dL (ref 6.0–8.3)

## 2023-06-30 LAB — HEMOGLOBIN A1C: Hgb A1c MFr Bld: 5.9 % (ref 4.6–6.5)

## 2023-06-30 LAB — TSH: TSH: 7.41 u[IU]/mL — ABNORMAL HIGH (ref 0.35–5.50)

## 2023-06-30 NOTE — Patient Instructions (Addendum)
      Blood work was ordered.       Medications changes include :   None    A referral was ordered podiatry and someone will call you to schedule an appointment.     Return for follow up PCP.

## 2023-06-30 NOTE — Assessment & Plan Note (Signed)
History of hypothyroidism Currently not on any medication Check TSH

## 2023-06-30 NOTE — Assessment & Plan Note (Signed)
Chronic Lab Results  Component Value Date   HGBA1C 5.9 06/30/2023   Sugars well-controlled Needed a glucometer-1 was sent to pharmacy Will use NovoLog as needed Continue Lantus

## 2023-06-30 NOTE — Assessment & Plan Note (Signed)
Chronic Appears euvolemic Following with cardiology, heart failure clinic Continue current medications

## 2023-06-30 NOTE — Assessment & Plan Note (Addendum)
Chronic Blood pressure adequately controlled here today Continue current medications CMP

## 2023-06-30 NOTE — Assessment & Plan Note (Signed)
Chronic On torsemide 40 mg daily Appears euvolemic Check CMP, CBC

## 2023-06-30 NOTE — Assessment & Plan Note (Signed)
S/p partial colectomy with ostomy for recurrent diverticulitis with perforation and abscess Still recovering Has open wound on abdomen-has home nurse coming to change bandage Will change bandage today and nurse expected to come Friday, but advised him to call the nurse to have her come up sooner for follow-up if possible

## 2023-06-30 NOTE — Assessment & Plan Note (Signed)
Chronic Following with cardiology On Tikosyn Xarelto was switched to Eliquis which she will discuss with cardiology

## 2023-07-01 ENCOUNTER — Encounter: Payer: Self-pay | Admitting: Internal Medicine

## 2023-07-01 MED ORDER — LEVOTHYROXINE SODIUM 25 MCG PO TABS: 25.0000 ug | ORAL_TABLET | Freq: Every day | ORAL | 0 refills | Status: DC

## 2023-07-01 NOTE — Addendum Note (Signed)
Addended by: Pincus Sanes on: 07/01/2023 07:49 AM   Modules accepted: Orders

## 2023-07-01 NOTE — Progress Notes (Signed)
No recommendations received from Dr Gala Romney at this time.

## 2023-07-02 ENCOUNTER — Telehealth: Payer: Self-pay | Admitting: Internal Medicine

## 2023-07-02 ENCOUNTER — Ambulatory Visit (INDEPENDENT_AMBULATORY_CARE_PROVIDER_SITE_OTHER): Payer: Medicare Other | Admitting: Physician Assistant

## 2023-07-02 ENCOUNTER — Telehealth: Payer: Self-pay

## 2023-07-02 ENCOUNTER — Encounter: Payer: Self-pay | Admitting: Physician Assistant

## 2023-07-02 VITALS — BP 153/88 | HR 94 | Temp 98.1°F | Ht 60.0 in | Wt 176.4 lb

## 2023-07-02 DIAGNOSIS — L853 Xerosis cutis: Secondary | ICD-10-CM | POA: Diagnosis not present

## 2023-07-02 MED ORDER — TRIAMCINOLONE ACETONIDE 0.1 % EX CREA: 1.0000 | TOPICAL_CREAM | Freq: Two times a day (BID) | CUTANEOUS | 0 refills | Status: AC

## 2023-07-02 NOTE — Telephone Encounter (Signed)
May I give the verbals ?

## 2023-07-02 NOTE — Telephone Encounter (Signed)
Verbal orders has been given

## 2023-07-02 NOTE — Progress Notes (Signed)
Established patient visit   Patient: Kathleen [name]   DOB: 1945/06/20   78 y.o. Female  MRN: [id number] Visit Date: 07/02/2023  Today's healthcare provider: Alfredia Ferguson, PA-C   Cc. Rash, allergy  Subjective     Pt, with a history of diverticulitis w/ colovaginal fistula, s/p partial distal colectomy 12/15, CHF, HTN, DM, CKD, afib, was in a rehab facility with [facility], discharged 06/28/23. She did subsequently follow up with her pcp's office 06/30/23. She reports she was discharged w/ mirtazapine, a new medication for her.  When talking to the patient, she is unsure if mirtazapine is new on discharge or if she was taking it in rehab.   Notably, xarelto was also recently switched to Eliquis.  She reports a red, mildly itchy, dry rash on her face since discharge. Seems to be spreading.  Medications: Outpatient Medications Prior to Visit  Medication Sig   acetaminophen (TYLENOL) 500 MG tablet Take 1,000 mg by mouth as needed.   albuterol (PROVENTIL) (2.5 MG/3ML) 0.083% nebulizer solution USE 3 ML VIA NEBULIZER THREE TIMES DAILY   bumetanide (BUMEX) 2 MG tablet Take 2 mg by mouth daily.   Cholecalciferol (VITAMIN D3) 50 MCG (2000 UT) TABS Take 2,000 Units by mouth at bedtime.   cyanocobalamin (VITAMIN B12) 1000 MCG/ML injection Inject 1 mL (1,000 mcg total) into the muscle every 30 (thirty) days.   docusate sodium (COLACE) 100 MG capsule Take 100 mg by mouth daily.   dofetilide (TIKOSYN) 250 MCG capsule Take 1 capsule (250 mcg total) by mouth in the morning and at bedtime.   ELIQUIS 5 MG TABS tablet Take 5 mg by mouth 2 (two) times daily.   febuxostat (ULORIC) 40 MG tablet TAKE 1 TABLET(40 MG) BY MOUTH DAILY (Patient taking differently: Take 40 mg by mouth at bedtime.)   fluticasone furoate-vilanterol (BREO ELLIPTA) 100-25 MCG/ACT AEPB INHALE 1 PUFF INTO THE LUNGS DAILY   insulin aspart (NOVOLOG) 100 UNIT/ML FlexPen Before each meal 3 times a day, 140-199 - 2 units, 200-250 - 4  units, 251-299 - 6 units,  300-349 - 8 units,  350 or above 10 units.   LANTUS SOLOSTAR 100 UNIT/ML Solostar Pen SMARTSIG:12 Unit(s) SUB-Q Every Morning   levalbuterol (XOPENEX) 0.63 MG/3ML nebulizer solution Take 3 mLs (0.63 mg total) by nebulization every 6 (six) hours as needed for wheezing or shortness of breath. DX J44.89 J96.21   levothyroxine (SYNTHROID) 25 MCG tablet Take 1 tablet (25 mcg total) by mouth daily.   Magnesium 500 MG TABS Take 500 mg by mouth 2 (two) times daily.   methocarbamol (ROBAXIN) 500 MG tablet Take 500 mg by mouth 3 (three) times daily as needed.   metoCLOPramide (REGLAN) 10 MG tablet Take by mouth.   metroNIDAZOLE (FLAGYL) 500 MG tablet SMARTSIG:1 Tablet(s) By Mouth Every 12 Hours   mirtazapine (REMERON) 15 MG tablet Take 15 mg by mouth at bedtime.   Nutritional Supplements (,FEEDING SUPPLEMENT, PROSOURCE PLUS) liquid Take 30 mLs by mouth 2 (two) times daily between meals.   nystatin (MYCOSTATIN/NYSTOP) powder Apply topically 3 (three) times daily.   ondansetron (ZOFRAN) 4 MG tablet Take 1 tablet (4 mg total) by mouth every 8 (eight) hours as needed for nausea or vomiting.   oxyCODONE (OXY IR/ROXICODONE) 5 MG immediate release tablet Take 1-2 tablets (5-10 mg total) by mouth every 6 (six) hours as needed for moderate pain (pain score 4-6) or severe pain (pain score 7-10) (5mg  moderate, 10mg  severe).   OXYGEN Inhale  4 L into the lungs continuous. Use with resmed ventilator   pantoprazole (PROTONIX) 40 MG tablet Take 1 tablet (40 mg total) by mouth daily.   potassium chloride (KLOR-CON) 10 MEQ tablet Take 40 mEq by mouth daily.   revefenacin (YUPELRI) 175 MCG/3ML nebulizer solution Take 175 mcg by nebulization daily.   rivaroxaban (XARELTO) 20 MG TABS tablet Take 1 tablet (20 mg total) by mouth daily with supper.   spironolactone (ALDACTONE) 25 MG tablet Take 1 tablet (25 mg total) by mouth 2 (two) times daily.   torsemide (DEMADEX) 20 MG tablet Take 2 tablets (40 mg  total) by mouth daily.   Zinc Oxide (DESITIN MAXIMUM STRENGTH) 40 % PSTE Apply 1 Application topically 4 (four) times daily. Mix with aquaphor and apply to inflamed area   gabapentin (NEURONTIN) 100 MG capsule Take 2 capsules (200 mg total) by mouth at bedtime.   No facility-administered medications prior to visit.    Review of Systems  Constitutional:  Negative for fatigue and fever.  Respiratory:  Negative for cough and shortness of breath.   Cardiovascular:  Negative for chest pain and leg swelling.  Gastrointestinal:  Negative for abdominal pain.  Skin:  Positive for rash.  Neurological:  Negative for dizziness and headaches.       Objective    BP (!) 153/88   Pulse 94   Temp 98.1 F (36.7 C) (Oral)   Ht 5' (1.524 m)   Wt 176 lb 6 oz (80 kg)   SpO2 (!) 81%   BMI 34.45 kg/m    Physical Exam Vitals reviewed.  Constitutional:      Appearance: She is not ill-appearing.  HENT:     Head: Normocephalic.  Eyes:     Conjunctiva/sclera: Conjunctivae normal.  Cardiovascular:     Rate and Rhythm: Normal rate.  Pulmonary:     Effort: Pulmonary effort is normal. No respiratory distress.  Skin:    Comments: Erythematous macular rash across face, chin, forehead. Extremely dry, flaking skin on face.  Foot exam-- similar mildly ertthematous dry skin  Neurological:     General: No focal deficit present.     Mental Status: She is alert and oriented to person, place, and time.  Psychiatric:        Mood and Affect: Mood normal.        Behavior: Behavior normal.      No results found for any visits on 07/02/23.  Assessment & Plan    Dry skin -     Triamcinolone Acetonide; Apply 1 Application topically 2 (two) times daily.  Dispense: 45 g; Refill: 0  Recommending topical steroid bid, and in between using a hydrating/protecting lotion/cream, ie aveeno, cerave, aquaphor, vaseline. Can use the same on her feet as well  Does not appear to be allergic in nature, but if it does  not resolve, may consider temporary d/c of mirtazapine w/ pcp awareness  Return if symptoms worsen or fail to improve.     Alfredia Ferguson, PA-C  [facility] [phone] (phone) [phone] (fax)  [facility]

## 2023-07-02 NOTE — Telephone Encounter (Unsigned)
Copied from CRM [id number]. Topic: Clinical - Home Health Verbal Orders >> Jul 02, 2023 11:32 AM Ernst Spell wrote: Caller/Agency: Malorie from [facility]  Malorie called to report that the patient stated that her face is starting to break out. Per Malorie, she appears to look flushed and rosie. Also on legs and feet. She may be having a bad reaction to new med, mirtazapine. Malorie stated that she will let the patient know to call back and schedule an appt. Please advise.

## 2023-07-02 NOTE — Telephone Encounter (Signed)
Copied from CRM [id number]. Topic: Clinical - Home Health Verbal Orders >> Jul 02, 2023  2:39 PM Deaijah H wrote: Caller/Agency: Shariece Physical therapist [facility]  Callback Number: [phone] Service Requested: Physical Therapy Frequency: 1w1 2w4  Any new concerns about the patient? Yes, lost of weight 177.2 (as of 07/02/23) ; dizzy when standing ; rashes on face

## 2023-07-02 NOTE — Telephone Encounter (Signed)
Patient has been scheduled today with another office.

## 2023-07-05 ENCOUNTER — Telehealth: Payer: Self-pay

## 2023-07-05 ENCOUNTER — Ambulatory Visit: Payer: Medicare Other | Attending: Cardiology

## 2023-07-05 DIAGNOSIS — Z950 Presence of cardiac pacemaker: Secondary | ICD-10-CM

## 2023-07-05 DIAGNOSIS — I5022 Chronic systolic (congestive) heart failure: Secondary | ICD-10-CM

## 2023-07-05 NOTE — Telephone Encounter (Unsigned)
Copied from CRM [id number]. Topic: Clinical - Home Health Verbal Orders >> Jul 05, 2023  3:35 PM Ferdie Ping wrote: Caller/Agency: [facility] Callback Number: [phone] Service Requested: Occupational Therapy Frequency: 1 week one day and 2 week 3 days for occupational therapy Any new concerns about the patient? No

## 2023-07-05 NOTE — Progress Notes (Signed)
EPIC Encounter for ICM Monitoring  Patient Name: Kathleen [name] is a 78 y.o. female Date: 07/05/2023 Primary Care Physican: [name], MD Primary Cardiologist: Bensimhon Electrophysiologist: Camnitz Bi-V Pacing: 100%          07/28/2022 Weight: 184.6 lbs 09/07/2022   Weight: 187 lbs 11/17/2022 Weight: 190-191 lbs    02/04/2023 Office: 194 lbs     03/16/2023 Weight: 190 lbs       03/23/2023 Weight: 188 lbs   04/06/2023 Weight: 176 lbs    06/30/2023 Weight: 178 lbs   Clinical Status  Since 30-Jun-2023 Time in AT/AF  0.0 hr/day (0.0%)                                   Spoke with patient and heart failure questions reviewed.  Transmission results reviewed.  Pt reports feet continue to swell.  She reports Torsemide and Potassium were not prescribed after rehab discharge but still remain on epic list.     Hospitalized 11/14 followed by inpatient rehab.  Discharged from rehab on 1/20    Diet:  She lives in an assisted living  Eats foods that are prepared at the facility she lives in which are not low salt.     Optivol thoracic impedance suggesting possible fluid accumulation starting 1/3 but trending back toward baseline.   Prescribed:  Bumetanide 2 mg take 1 tablet by mouth daily (Pt was on Torsemide 40 mg daily and Potassium prior to November hospitalization) Spironolactone 25 mg take 1 tablet twice a day   Labs: 06/30/2023 Creatinine 0.69, BUN 16, Potassium 3.4, Sodium 142, GFR 84.25   05/27/2023 Creatinine 0.82, BUN 23, Potassium 4.5, Sodium 134, GFR >60  05/26/2023 Creatinine 0.57, BUN 23, Potassium 4.8, Sodium 136, GFR >60  05/24/2023 Creatinine 0.65, BUN 37, Potassium 3.8, Sodium 130, GFR >60  05/23/2023 Creatinine 0.62, BUN 37, Potassium 3.8, Sodium 132, GFR >60 05/22/2023 Creatinine 0.57, BUN 36, Potassium 4.1, Sodium 129, GFR >60  05/21/2023 Creatinine 0.54, BUN 28, Potassium 3.9, Sodium 131, GFR >60  A complete set of results can be found in Results Review.    Recommendations:  Copy sent to Dr Gala Romney for review and recommendations.  Advised to call Dr Bensimhon's office to schedule up post rehab appointment.    Follow-up plan: ICM clinic phone appointment on 07/12/2023 to recheck fluid levels.  91 day device clinic remote transmission 09/29/2023.     EP/Cardiology Office Visits:  She is aware to call Dr Gala Romney and Dr Elberta Fortis office to make appointments.  Recall 07/14/2023 with Dr Gala Romney.   Recall 05/13/2023 with Francis Dowse, PA or Otilio Saber, [place].     Copy of ICM check sent to Dr. Elberta Fortis.   3 month ICM trend: 07/05/2023.    12-14 Month ICM trend:     Karie Soda, RN 07/05/2023 4:12 PM

## 2023-07-05 NOTE — Telephone Encounter (Signed)
Remote ICM transmission received.  Attempted call to patient regarding ICM remote transmission and daughter answered phone and stated she was resting.

## 2023-07-06 NOTE — Telephone Encounter (Signed)
Verbal orders given

## 2023-07-06 NOTE — Telephone Encounter (Signed)
May I give the verbals ?

## 2023-07-07 ENCOUNTER — Ambulatory Visit (INDEPENDENT_AMBULATORY_CARE_PROVIDER_SITE_OTHER): Payer: Medicare Other

## 2023-07-07 ENCOUNTER — Ambulatory Visit (INDEPENDENT_AMBULATORY_CARE_PROVIDER_SITE_OTHER): Payer: Medicare Other | Admitting: Podiatry

## 2023-07-07 ENCOUNTER — Encounter: Payer: Self-pay | Admitting: Podiatry

## 2023-07-07 DIAGNOSIS — M79675 Pain in left toe(s): Secondary | ICD-10-CM | POA: Diagnosis not present

## 2023-07-07 DIAGNOSIS — B351 Tinea unguium: Secondary | ICD-10-CM

## 2023-07-07 DIAGNOSIS — M779 Enthesopathy, unspecified: Secondary | ICD-10-CM

## 2023-07-07 DIAGNOSIS — M7752 Other enthesopathy of left foot: Secondary | ICD-10-CM | POA: Diagnosis not present

## 2023-07-07 DIAGNOSIS — M79674 Pain in right toe(s): Secondary | ICD-10-CM

## 2023-07-07 MED ORDER — BETAMETHASONE SOD PHOS & ACET 6 (3-3) MG/ML IJ SUSP
3.0000 mg | Freq: Once | INTRAMUSCULAR | Status: AC
  Administered 2023-07-07: 3 mg via INTRA_ARTICULAR

## 2023-07-07 NOTE — Progress Notes (Signed)
Chief Complaint  Patient presents with   Nail Problem    "My toenails are hard, I have dried out feet.  The left one hurts on top." N - hard toenails, dry feet L - 1-5 bilateral,  bilateral foot - dry D - toenails been like that for years, dry skin - 2 mos O - gradually worse C - hard, thick, discolored, dry A - none T - none   Foot Pain    "I have pain on top of my left foot." N - pain top of foot L - 3rd met left D - 3 weeks O - suddenly C - sore A - shoes T - none    SUBJECTIVE Patient presents to office today complaining of elongated, thickened nails that cause pain while ambulating in shoes.  Patient is unable to trim their own nails.  Patient has also been experiencing dry skin and the nurse where she resides would like to have it evaluated  Patient has also been experiencing some pain and tenderness associated to the left forefoot ongoing for about 3 weeks.  She does recall that she injured her toe a few months prior.  Patient is here for further evaluation and treatment.  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  Clydie Braun Highfill FNP-C   Pneumococcal 13-Val Conj Vacc Itching, Swelling and Rash   Dronedarone Rash   Montelukast Other  (See Comments)    Makes her loopy.   Brovana [Arformoterol]     Caused muscle pain   Budesonide     Caused extreme joint pain   Entresto [Sacubitril-Valsartan] Other (See Comments)    hypotension   Fosamax [Alendronate Sodium] Nausea Only   Jardiance [Empagliflozin]     Caused a vaginal infection   Meperidine Nausea And Vomiting   Microplegia Msa-Msg [Plegisol]     Loopy,diarrhea   Rosuvastatin Other (See Comments)    Reaction:  Muscle spasms    Tetracycline Hives   Adhesive [Tape] Rash   Lovastatin Rash and Other (See Comments)    Muscle Pain     OBJECTIVE General Patient is awake, alert, and oriented x 3 and in no acute distress. Derm Skin is dry and supple bilateral. Negative open lesions or macerations. Remaining integument unremarkable. Nails are tender, long, thickened and dystrophic with subungual debris, consistent with onychomycosis, 1-5 bilateral. No signs of infection noted. Vasc  DP and PT pedal pulses palpable bilaterally. Temperature gradient within normal limits.  Neuro grossly intact via light touch Musculoskeletal Exam No symptomatic pedal deformities noted bilateral. Muscular strength within normal limits.  Tenderness with palpation noted just proximal to the lesser MTPs of the left forefoot Radiographic exam LT foot  07/07/2023 joint spaces preserved.  No acute fractures identified.  Impression: Negative  ASSESSMENT 1.  Pain due to onychomycosis of toenails both 2.  Capsulitis left foot 3.  Xerosis of skin  PLAN OF CARE 1. Patient evaluated today.  Recommend daily foot lotion. 2. Instructed to maintain good pedal hygiene and foot care.  3. Mechanical debridement of nails 1-5 bilaterally performed using a nail nipper. Filed with dremel without incident.  4.  Injection of 0.5 cc Celestone Soluspan injected into the lesser MTP of the left foot  5.  Return to clinic in 3 mos.    Felecia Shelling, DPM [facility]  Dr. Felecia Shelling, DPM     [address] [place], [place] [address]                Office [phone]  Fax [phone]

## 2023-07-09 ENCOUNTER — Telehealth: Payer: Self-pay | Admitting: Internal Medicine

## 2023-07-09 NOTE — Telephone Encounter (Signed)
 Please advise

## 2023-07-09 NOTE — Progress Notes (Signed)
No recommendations received.

## 2023-07-09 NOTE — Telephone Encounter (Unsigned)
Copied from CRM [id number]. Topic: Clinical - Medical Advice >> Jul 09, 2023  3:10 PM [name] wrote: Reason for CRM: [name] from [facility] called to alert Dr. Yetta Barre that pt weight is outside of her parameters. Weight is 174lbs, Parameters 185-195lbs. Please advise.

## 2023-07-12 ENCOUNTER — Telehealth: Payer: Self-pay

## 2023-07-12 ENCOUNTER — Ambulatory Visit: Payer: Medicare Other | Attending: Cardiology

## 2023-07-12 DIAGNOSIS — I5022 Chronic systolic (congestive) heart failure: Secondary | ICD-10-CM

## 2023-07-12 DIAGNOSIS — Z950 Presence of cardiac pacemaker: Secondary | ICD-10-CM

## 2023-07-12 NOTE — Telephone Encounter (Signed)
 Remote ICM transmission received.  Attempted call to patient regarding ICM remote transmission and no answer.

## 2023-07-12 NOTE — Progress Notes (Signed)
EPIC Encounter for ICM Monitoring  Patient Name: Kathleen [name] is a 78 y.o. female Date: 07/12/2023 Primary Care Physican: [name], MD Primary Cardiologist: Bensimhon Electrophysiologist: Camnitz Bi-V Pacing: 100%          07/28/2022 Weight: 184.6 lbs 09/07/2022   Weight: 187 lbs 11/17/2022 Weight: 190-191 lbs    02/04/2023 Office: 194 lbs     03/16/2023 Weight: 190 lbs       03/23/2023 Weight: 188 lbs   04/06/2023 Weight: 176 lbs    06/30/2023 Weight: 178 lbs   Clinical Status  Since 05-Jul-2023 Time in AT/AF  0.0 hr/day (0.0%)                                   Attempted call to patient and unable to reach.  Transmission results reviewed.    Hospitalized 11/14 followed by inpatient rehab.  Discharged from rehab on 1/20    Diet:  She lives in an assisted living  Eats foods that are prepared at the facility she lives in which are not low salt.     Optivol thoracic impedance suggesting possible fluid accumulation starting 1/3 but trending back toward baseline.   Prescribed:  Bumetanide 2 mg take 1 tablet by mouth daily (Pt reports she was on Torsemide 40 mg daily and Potassium which were not prescribed after rehab discharge but still remain on epic list).  Spironolactone 25 mg take 1 tablet twice a day   Labs: 06/30/2023 Creatinine 0.69, BUN 16, Potassium 3.4, Sodium 142, GFR 84.25   05/27/2023 Creatinine 0.82, BUN 23, Potassium 4.5, Sodium 134, GFR >60  05/26/2023 Creatinine 0.57, BUN 23, Potassium 4.8, Sodium 136, GFR >60  05/24/2023 Creatinine 0.65, BUN 37, Potassium 3.8, Sodium 130, GFR >60  05/23/2023 Creatinine 0.62, BUN 37, Potassium 3.8, Sodium 132, GFR >60 05/22/2023 Creatinine 0.57, BUN 36, Potassium 4.1, Sodium 129, GFR >60  05/21/2023 Creatinine 0.54, BUN 28, Potassium 3.9, Sodium 131, GFR >60  A complete set of results can be found in Results Review.   Recommendations:  Unable to reach.  Will send copy sent to HF clinic for review and recommendations if patient  is reached.  Previously instructed patient to call HF clinic for post hospital/rehab OV.     Follow-up plan: ICM clinic phone appointment on 07/20/2023 to recheck fluid levels.  91 day device clinic remote transmission 09/29/2023.     EP/Cardiology Office Visits:  Recall 07/14/2023 with Dr Gala Romney.   Recall 05/13/2023 with Francis Dowse, PA or Otilio Saber, [place].     Copy of ICM check sent to Dr. Elberta Fortis.   3 month ICM trend: 07/12/2023.    12-14 Month ICM trend:     Karie Soda, RN 07/12/2023 2:42 PM

## 2023-07-12 NOTE — Telephone Encounter (Signed)
Copied from CRM [id number]. Topic: General - Other >> Jul 12, 2023 11:15 AM Larwance Sachs wrote: Reason for CRM: [name] from physical therapy called in to advise when nurse set up home health perimeter were set for weight 185-195. Patient is currently 172 which is out of perimeter. Orvilla Fus is advising Dr.Jones incase provider wants to change weight perimeters for patient or physical therapy will have to keep calling in to update on current weight after every weighing of patient  Please call back at [phone] if need be

## 2023-07-12 NOTE — Addendum Note (Signed)
Addended by: Karie Soda on: 07/12/2023 02:51 PM   Modules accepted: Level of Service

## 2023-07-13 NOTE — Telephone Encounter (Signed)
Spoke with Orvilla Fus, Dr.Jones is only concerned if patient is gaining an extreme amount of weight. He is okay with her weight being on the lower end of things.

## 2023-07-15 NOTE — Progress Notes (Deleted)
 Office Visit Note  Patient: Kathleen [name]             Date of Birth: 04/28/1946           MRN: [id number]             PCP: [name], MD Referring: [name], MD Visit Date: 07/29/2023   Subjective:  No chief complaint on file.   History of Present Illness: VERYL [name] is a 78 y.o. 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    Previous HPI 01/27/2023 Kathleen [name] is a 78 y.o. 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  Gout is doing well overall with no new definite flareup since her last visit has been only 1 in the past year.  Unfortunately has had ongoing joint pain at multiple areas.  Especially in her neck and back and shoulders.  Seeing Dr. Shon Baton for this had steroid injection at the right subacromial joint which was beneficial.  Also started formal physical therapy for deficits due to rotator cuff tear and has improved her extension and abduction range of motion to slightly better than horizontal.  Has lot of trigger point and muscle knot problems in the upper back these are sometimes worse after doing shoulder exercises. Recent medical events with exacerbation of COPD leading to a hospitalization earlier this month.  Before that she was sick with worsening respiratory symptoms from a pneumonia that did not resolve for several weeks.  X-ray from July 15 did not show evidence of pneumonia.  X-ray at hospital admission on August 7 also with clear airspace but with new pleural effusion.  She was not seeing any visible increase in lower extremity swelling.  Lab testing on discharge showed decrease in total protein level. Also has ongoing trouble with intermittent numbness and dropping items unintentionally more in the right hand.  Reports previous history of carpal tunnel syndrome but reasonably well-controlled based on previous orthopedics note.  Never had any local intervention on this. Also has pain in her left foot  currently after she struck her third toe on a counter corner walking at home.  There was extensive bruising and swelling immediately afterwards but this has mostly improved after 4 weeks.  She initially treated with buddy taping the second and third toe together. Since then just wearing some shoes with a looser toe box.   Previous HPI 07/23/2022 Kathleen [name] is a 78 y.o. 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  She has not had any new flareups since her last visit.  Recently had some additional adjustments to her diuretic medication due to increase in volume overload and oxygen requirement.  For about the past 3 weeks is also having increase in left shoulder pain with decreased mobility.  This is limiting her quite a bit since the right shoulder has little use due to full-thickness tendon tears for which she is not a surgical candidate to repair due to severe cardiopulmonary disease.  Less severe is having pain in bilateral hips this frequently bothers her lying in bed at night or if she is seated in fixed position for more than about half an hour.   Previous HPI 01/19/22 Kathleen [name] is a 78 y.o. female here for evaluation and management of chronic gout.  She has had gouty arthritis ongoing for a few years now most often affecting the right  foot.  She saw a rheumatology provider in [place] for management more recently just being treated with her primary care office.  She has previously taken allopurinol stopped due to concern about allergic reaction.  She was on maintenance colchicine for flare prevention but had stopped this due to lack of efficacy.  More recently was prescribed colchicine during severe flareup of pain in her left hand with no obvious clinical response.  She was treated with probenecid apparently stopped due to poor clinical response, not sure if this was uric acid or other parameters.  More recently appears to have been treated with uloric since 2020 but  with increased frequency and duration of gout flares in the past year. She was admitted to the hospital in December apparently with sepsis secondary to some chest wall cellulitis.  During his hospital stay she developed right and then the left-sided foot swelling and severe pain.  She also had a recurrence of the right foot symptoms within a few days after hospital discharge.  This was treated with prednisone with clinical improvement.  Symptoms remained okay with her feet but in June developed sudden left fourth PIP joint pain swelling and erythema.  This was thought to represent a new gout flare and treated with oral colchicine.  Symptoms are ongoing for about a month before resolving on their own she did not see any difference when taking the medicine.  She has been taking her subluxes at 40 mg daily consistently throughout this time.  Right now joints are pretty much all back to baseline.   No Rheumatology ROS completed.   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   Carpal tunnel syndrome, right upper limb 01/27/2023   Acute exacerbation of chronic obstructive pulmonary disease (COPD) (HCC) 01/13/2023   Dysuria 11/26/2022   Hypercoagulable state due to persistent atrial fibrillation (HCC) 11/18/2022   Acute on chronic heart failure with preserved ejection fraction (HCC) 10/26/2022   COPD with acute exacerbation (HCC) 10/09/2022   Pain in left shoulder 07/23/2022   Chronic obstructive pulmonary  disease (HCC) 06/28/2022   Palliative care encounter 06/28/2022   Post-viral cough syndrome 06/02/2022   Paranasal sinus disease 05/19/2022   Heart failure with reduced ejection fraction (HCC) 05/14/2022   Pneumonia 04/29/2022   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   Depression, major, single episode, moderate (HCC) 04/27/2022   Obesity (BMI 30-39.9) 03/18/2022   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   Low TSH level 12/02/2021   Acute idiopathic gout of left hand 11/19/2021   Acquired hypothyroidism 11/12/2021   Encounter for palliative care involving management of pain 11/12/2021   Post concussive syndrome 11/11/2021   Chronic left-sided low back pain 11/04/2021   Gout 10/01/2021   Hyperlipidemia with target LDL less than 100 08/07/2021   Chronic anticoagulation 07/07/2021   History of colon polyps 07/07/2021   Stage 3a chronic kidney disease (HCC) 05/23/2021   Dietary iron deficiency 05/23/2021   Age-related osteoporosis without current pathological fracture 04/22/2021   Type 2 diabetes mellitus with diabetic neuropathy, without long-term current use  of insulin (HCC) 04/22/2021   Sleep apnea 01/22/2021   Statin intolerance 10/12/2019   Statin myopathy 02/24/2019   Chronic combined systolic and diastolic heart failure (HCC) 04/06/2018   HTN (hypertension) 04/06/2018   COPD (chronic obstructive pulmonary disease) (HCC) 04/06/2018   Persistent atrial fibrillation (HCC) 04/06/2018   Chronic respiratory failure with hypoxia (HCC) 03/28/2018   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 12/02/2015   Protein-calorie malnutrition, moderate (HCC) 09/11/2015    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    Concussion 10/04/2021   COPD (chronic obstructive pulmonary disease) (HCC)     Diverticulitis    Gout    Hypertension    Longstanding persistent atrial fibrillation (HCC)    on Xarelto   Nonischemic cardiomyopathy (HCC)    Obesity    Pacemaker    Spontaneous pneumothorax 2013    Family History  Problem Relation Age of Onset   Breast cancer Mother 64       3 different times   Pancreatic cancer Mother    Emphysema Father    Healthy Brother    Breast cancer Cousin    Valvular heart disease Son    Obesity Daughter    Colon cancer Neg Hx    Esophageal cancer Neg Hx    Stomach cancer Neg Hx    Inflammatory bowel disease Neg Hx    Liver disease Neg Hx    Rectal cancer Neg Hx    Past Surgical History:  Procedure Laterality Date   ABDOMINAL HYSTERECTOMY     APPENDECTOMY     APPLICATION OF WOUND VAC  04/27/2023   Procedure: APPLICATION OF WOUND VAC;  Surgeon: Emelia Loron, MD;  [facility];  Service: General;;   ATRIAL FIBRILLATION ABLATION  07/20/2013   by Dr Christin Fudge   AV nodal ablation  11/01/2013   by Dr Christin Fudge, repeated by Dr Wilford Grist   BREAST BIOPSY Bilateral 1997   negative   CARDIAC CATHETERIZATION     CHOLECYSTECTOMY     COLON RESECTION SIGMOID N/A 04/27/2023   Procedure: COLON RESECTION SIGMOID WITH COLOSTOMY;  Surgeon: Emelia Loron, MD;  Location: [facility] OR;  Service: General;  Laterality: N/A;   ESOPHAGOGASTRODUODENOSCOPY (EGD) WITH PROPOFOL N/A 05/24/2023   Procedure: ESOPHAGOGASTRODUODENOSCOPY (EGD) WITH PROPOFOL;  Surgeon: Beverley Fiedler, MD;  Location: [facility] ENDOSCOPY;  Service: Gastroenterology;  Laterality: N/A;   HERNIA REPAIR     LAPAROTOMY N/A 04/27/2023   Procedure: EXPLORATORY LAPAROTOMY;  Surgeon: Emelia Loron, MD;  Location: [facility] OR;  Service: General;  Laterality: N/A;   PACEMAKER INSERTION  06/2017   MDT Viva CRT-P implanted by Dr Christin Fudge after AV nodal ablation,  LV lead could not be placed   PACEMAKER INSERTION  05/2020   with lead bundle   RIGHT/LEFT HEART CATH AND CORONARY ANGIOGRAPHY N/A 03/20/2022    Procedure: RIGHT/LEFT HEART CATH AND CORONARY ANGIOGRAPHY;  Surgeon: Swazil[place], Peter M, MD;  Location: [facility] INVASIVE CV LAB;  Service: Cardiovascular;  Laterality: N/A;   Social History   Social History Narrative   Pt lives in [place] alone.  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  Her son works in [employer].   Immunization History  Administered Date(s) Administered   Fluad Quad(high Dose 65+) 04/22/2021, 03/16/2022   Hepatitis  A, Adult 04/12/2001   Influenza, High Dose Seasonal PF 03/16/2019   PFIZER Comirnaty(Gray Top)Covid-19 Tri-Sucrose Vaccine 03/29/2020, 10/09/2020   PFIZER(Purple Top)SARS-COV-2 Vaccination 07/13/2019, 08/08/2019   Pneumococcal Conjugate-13 08/30/2014   Pneumococcal Polysaccharide-23 03/31/2019   Tdap 10/02/2013   Yellow Fever 04/12/2001     Objective: Vital Signs: There were no vitals taken for this visit.   Physical Exam   Musculoskeletal Exam: ***  CDAI Exam: CDAI Score: -- Patient Global: --; Provider Global: -- Swollen: --; Tender: -- Joint Exam 07/29/2023   No joint exam has been documented for this visit   There is currently no information documented on the homunculus. Go to the Rheumatology activity and complete the homunculus joint exam.  Investigation: No additional findings.  Imaging: DG Foot Complete Left Result Date: 07/07/2023 Please see detailed radiograph report in office note.  CUP PACEART REMOTE DEVICE CHECK Result Date: 06/30/2023 Scheduled remote reviewed. Normal device function.  HF diagnostics currently abnormal, followed by ICM Next remote 91 days. LA, CVRS   Recent Labs: Lab Results  Component Value Date   WBC 10.7 (H) 06/30/2023   HGB 12.3 06/30/2023   PLT 323.0 06/30/2023   NA 142 06/30/2023   K 3.4 (L) 06/30/2023   CL 97 06/30/2023   CO2 35 (H) 06/30/2023   GLUCOSE 118 (H) 06/30/2023   BUN 16 06/30/2023   CREATININE 0.69 06/30/2023   BILITOT 0.5 06/30/2023   ALKPHOS 49 06/30/2023   AST 10  06/30/2023   ALT 7 06/30/2023   PROT 6.7 06/30/2023   ALBUMIN 4.2 06/30/2023   CALCIUM 10.0 06/30/2023   GFRAA 58 (L) 06/28/2019    Speciality Comments: No specialty comments available.  Procedures:  No procedures performed Allergies: Allopurinol, Clindamycin, Flublok [influenza vaccine recombinant], Pneumococcal 13-val conj vacc, Dronedarone, Montelukast, Brovana [arformoterol], Budesonide, Entresto [sacubitril-valsartan], Fosamax [alendronate sodium], Jardiance [empagliflozin], Meperidine, Microplegia msa-msg [plegisol], Rosuvastatin, Tetracycline, Adhesive [tape], and Lovastatin   Assessment / Plan:     Visit Diagnoses: No diagnosis found.  ***  Orders: No orders of the defined types were placed in this encounter.  No orders of the defined types were placed in this encounter.    Follow-Up Instructions: No follow-ups on file.   Metta Clines, RT  Note - This record has been created using [employer].  Chart creation errors have been sought, but may not always  have been located. Such creation errors do not reflect on  the standard of medical care.

## 2023-07-16 NOTE — Telephone Encounter (Signed)
 Copied from CRM [id number]. Topic: Clinical - Home Health Verbal Orders >> Jul 16, 2023  2:18 PM Alexandria E wrote: Caller/Agency: Mallory form [facility] Callback Number: [phone] Service Requested: [facility] was calling to let provider know that patient's weight today (2/7) was 172 lbs, but her weight parameter is 185-195 lbs. Frequency: Mallory just wanted to let provider know. Any new concerns about the patient? Not that home nurse mentioned, just wanted to give a FYI to provider.

## 2023-07-20 ENCOUNTER — Ambulatory Visit: Payer: Medicare Other | Attending: Cardiology

## 2023-07-20 DIAGNOSIS — I5022 Chronic systolic (congestive) heart failure: Secondary | ICD-10-CM

## 2023-07-20 DIAGNOSIS — Z950 Presence of cardiac pacemaker: Secondary | ICD-10-CM

## 2023-07-21 NOTE — Progress Notes (Signed)
EPIC Encounter for ICM Monitoring  Patient Name: Kathleen [name] is a 78 y.o. female Date: 07/21/2023 Primary Care Physican: [name], MD Primary Cardiologist: Bensimhon Electrophysiologist: Camnitz Bi-V Pacing: 100%          07/28/2022 Weight: 184.6 lbs 09/07/2022   Weight: 187 lbs 11/17/2022 Weight: 190-191 lbs    02/04/2023 Office: 194 lbs     03/16/2023 Weight: 190 lbs       03/23/2023 Weight: 188 lbs   04/06/2023 Weight: 176 lbs    06/30/2023 Weight: 178 lbs   Clinical Status Since 12-Jul-2023 Time in AT/AF  0.0 hr/day (0.0%)                                   Attempted call to patient and unable to reach.   Transmission results reviewed.    Hospitalized 11/14 followed by inpatient rehab.  Discharged from rehab on 1/20    Diet:  She lives in an assisted living  Eats foods that are prepared at the facility she lives in which are not low salt.     Optivol thoracic impedance suggesting possible fluid accumulation that started 1/3 has returned close to baseline normal.   Prescribed:  Bumetanide 2 mg take 1 tablet by mouth daily (Pt was on Torsemide 40 mg daily and Potassium prior to November hospitalization) Spironolactone 25 mg take 1 tablet twice a day   Labs: 06/30/2023 Creatinine 0.69, BUN 16, Potassium 3.4, Sodium 142, GFR 84.25   05/27/2023 Creatinine 0.82, BUN 23, Potassium 4.5, Sodium 134, GFR >60  05/26/2023 Creatinine 0.57, BUN 23, Potassium 4.8, Sodium 136, GFR >60  05/24/2023 Creatinine 0.65, BUN 37, Potassium 3.8, Sodium 130, GFR >60  05/23/2023 Creatinine 0.62, BUN 37, Potassium 3.8, Sodium 132, GFR >60 05/22/2023 Creatinine 0.57, BUN 36, Potassium 4.1, Sodium 129, GFR >60  05/21/2023 Creatinine 0.54, BUN 28, Potassium 3.9, Sodium 131, GFR >60  A complete set of results can be found in Results Review.   Recommendations:  Unable to reach.     Follow-up plan: ICM clinic phone appointment on 08/02/2023.  91 day device clinic remote transmission 09/29/2023.      EP/Cardiology Office Visits:  She is aware to call Dr Gala Romney and Dr Elberta Fortis office to make appointments.  Recall 07/14/2023 with Dr Gala Romney.   Recall 05/13/2023 with Francis Dowse, PA or Otilio Saber, [place].     Copy of ICM check sent to Dr. Elberta Fortis.    3 month ICM trend: 07/20/2023.    12-14 Month ICM trend:     Karie Soda, RN 07/21/2023 11:47 AM

## 2023-07-22 ENCOUNTER — Other Ambulatory Visit: Payer: Self-pay | Admitting: Internal Medicine

## 2023-07-22 ENCOUNTER — Other Ambulatory Visit: Payer: Self-pay | Admitting: Neurology

## 2023-07-22 ENCOUNTER — Encounter: Payer: Self-pay | Admitting: Internal Medicine

## 2023-07-22 NOTE — Telephone Encounter (Signed)
Last Fill: 04/05/2023  Labs: 06/30/2023  WBC 10.7 RDW 18.0 Neutrophils Relative % 77.4 Lymphocytes Relative 10.4 Neutro Abs 8.2 Potassium 3.4 CO2 35 Glucose 118  05/24/2023 Uric Acid 8.6  Next Visit: 07/29/2023  Last Visit: 01/27/2023  DX: Acute idiopathic gout of left hand   Current Dose per office note 01/27/2023: Uloric 40 mg daily   Okay to refill Uloric?

## 2023-07-23 ENCOUNTER — Telehealth: Payer: Self-pay

## 2023-07-23 ENCOUNTER — Other Ambulatory Visit (HOSPITAL_COMMUNITY): Payer: Self-pay

## 2023-07-23 MED ORDER — DOFETILIDE 250 MCG PO CAPS: 250.0000 ug | ORAL_CAPSULE | Freq: Two times a day (BID) | ORAL | 6 refills | Status: DC

## 2023-07-23 MED ORDER — FEBUXOSTAT 40 MG PO TABS: 40.0000 mg | ORAL_TABLET | Freq: Every day | ORAL | 0 refills | Status: DC

## 2023-07-23 NOTE — Telephone Encounter (Signed)
Returned call to patient as requested regarding heart med refill.  Advised patient to call Dr Bensimhon's office today to request refill.  Reminded her as previously discussed at [facility] call, to schedule an OV for follow up after rehab discharge so all the meds can be reviewed.  She will call today.

## 2023-07-26 ENCOUNTER — Telehealth: Payer: Self-pay

## 2023-07-26 MED ORDER — LEVOTHYROXINE SODIUM 25 MCG PO TABS: 25.0000 ug | ORAL_TABLET | Freq: Every day | ORAL | 0 refills | Status: DC

## 2023-07-26 MED ORDER — BUMETANIDE 2 MG PO TABS: 2.0000 mg | ORAL_TABLET | Freq: Every day | ORAL | 0 refills | Status: DC

## 2023-07-26 MED ORDER — ELIQUIS 5 MG PO TABS: 5.0000 mg | ORAL_TABLET | Freq: Two times a day (BID) | ORAL | 0 refills | Status: DC

## 2023-07-26 MED ORDER — MIRTAZAPINE 15 MG PO TABS: 15.0000 mg | ORAL_TABLET | Freq: Every day | ORAL | 0 refills | Status: DC

## 2023-07-26 MED ORDER — GABAPENTIN 100 MG PO CAPS: 200.0000 mg | ORAL_CAPSULE | Freq: Every day | ORAL | 6 refills | Status: DC

## 2023-07-26 NOTE — Telephone Encounter (Signed)
I dont see that you have filled these as they say historical provider, pt sent mychart requesting refills. please advise

## 2023-07-26 NOTE — Telephone Encounter (Signed)
Copied from CRM [id number]. Topic: Referral - Request for Referral >> Jul 26, 2023 10:42 AM Dennison Nancy wrote: Did the patient discuss referral with their provider in the last year? No (If No - schedule appointment) (If Yes - send message)  Appointment offered? No  Type of order/referral and detailed reason for visit: ostomy - reason having issue with ostomy bag   Christy with [facility] called for a new referral w Preference of office, provider, location: can call Neysa Bonito from [facility] at  [phone]  the  fax number  to fax the referral is [phone]   Once received the referral Neysa Bonito would set up an appointment for the patient  If referral order, have you been seen by this specialty before? No (If Yes, this issue or another issue? When? Where?  Can we respond through MyChart? No

## 2023-07-27 ENCOUNTER — Telehealth: Payer: Self-pay

## 2023-07-27 ENCOUNTER — Other Ambulatory Visit: Payer: Self-pay | Admitting: Internal Medicine

## 2023-07-27 NOTE — Telephone Encounter (Signed)
Copied from CRM [id number]. Topic: Clinical - Medical Advice >> Jul 27, 2023 10:27 AM Ernst Spell wrote: Reason for CRM: Beryle Beams, nurse with [facility], called to let Dr. Yetta Barre know that the pt has been having left lower quadrant pain, she rated the pain a 6/10,. She said repositioning helps relieve some. Please advise.

## 2023-07-28 ENCOUNTER — Other Ambulatory Visit: Payer: Self-pay | Admitting: Internal Medicine

## 2023-07-28 ENCOUNTER — Telehealth: Payer: Self-pay | Admitting: Internal Medicine

## 2023-07-28 NOTE — Telephone Encounter (Signed)
Verbal order for physical therapy two week three  and one week one        weight yesterday 170 today 174 4 pound increase in 24 hours and she complain of 7/10 pain right shoulder pain bp is 100/60  when she stood fup or three mins and bp went down to 80/60 felt light head and legs wanted to give out bp back 90/60 with legs elevated       taking spironolattone 25mg   medication for fluid one tab once a day need clarification   patient was given lancets or diabetes and she has a glucometer she needs help on knowing how to work it  took insulin yesterday needs a call back

## 2023-07-28 NOTE — Telephone Encounter (Signed)
 Patient has been scheduled for an appointment.

## 2023-07-28 NOTE — Telephone Encounter (Signed)
Pt orders has been given.

## 2023-07-28 NOTE — Telephone Encounter (Signed)
 Unable to reach patient. LMTRC

## 2023-07-28 NOTE — Telephone Encounter (Signed)
Place the referral and send this message back to me so that I can reach out to christy

## 2023-07-28 NOTE — Telephone Encounter (Signed)
Would you like to see her in office?

## 2023-07-28 NOTE — Telephone Encounter (Signed)
Can I give the verbals ?

## 2023-07-28 NOTE — Telephone Encounter (Signed)
Christy in from the ostomy depart she will be the one that will handle the referral once you place it and I give her a call back. She works at [facility]

## 2023-07-29 ENCOUNTER — Encounter (HOSPITAL_COMMUNITY): Payer: Self-pay | Admitting: Internal Medicine

## 2023-07-29 ENCOUNTER — Ambulatory Visit: Payer: Medicare Other | Admitting: Internal Medicine

## 2023-07-29 DIAGNOSIS — G5601 Carpal tunnel syndrome, right upper limb: Secondary | ICD-10-CM

## 2023-07-29 DIAGNOSIS — M10042 Idiopathic gout, left hand: Secondary | ICD-10-CM

## 2023-07-29 DIAGNOSIS — N1831 Chronic kidney disease, stage 3a: Secondary | ICD-10-CM

## 2023-07-29 DIAGNOSIS — Z5181 Encounter for therapeutic drug level monitoring: Secondary | ICD-10-CM

## 2023-07-29 DIAGNOSIS — G8929 Other chronic pain: Secondary | ICD-10-CM

## 2023-07-29 DIAGNOSIS — M25559 Pain in unspecified hip: Secondary | ICD-10-CM

## 2023-08-02 ENCOUNTER — Other Ambulatory Visit: Payer: Self-pay | Admitting: Internal Medicine

## 2023-08-02 ENCOUNTER — Ambulatory Visit: Payer: Medicare Other | Attending: Cardiology

## 2023-08-02 DIAGNOSIS — Z9049 Acquired absence of other specified parts of digestive tract: Secondary | ICD-10-CM

## 2023-08-02 DIAGNOSIS — Z950 Presence of cardiac pacemaker: Secondary | ICD-10-CM | POA: Diagnosis not present

## 2023-08-02 DIAGNOSIS — I5022 Chronic systolic (congestive) heart failure: Secondary | ICD-10-CM | POA: Diagnosis not present

## 2023-08-02 DIAGNOSIS — N824 Other female intestinal-genital tract fistulae: Secondary | ICD-10-CM

## 2023-08-02 MED ORDER — SPIRONOLACTONE 25 MG PO TABS: 12.5000 mg | ORAL_TABLET | Freq: Two times a day (BID) | ORAL | 3 refills | Status: DC

## 2023-08-02 NOTE — Progress Notes (Unsigned)
 EPIC Encounter for ICM Monitoring  Patient Name: Kathleen [name] is a 78 y.o. female Date: 08/02/2023 Primary Care Physican: [name], MD Primary Cardiologist: Bensimhon Electrophysiologist: Elberta Fortis Bi-V Pacing: 100%          03/16/2023 Weight: 190 lbs       03/23/2023 Weight: 188 lbs   04/06/2023 Weight: 176 lbs    06/30/2023 Weight: 178 lbs 08/02/2023 Weight: 172 lbs   Clinical Status Since 20-Jul-2023 Time in AT/AF <0.1 hr/day (<0.1%)                                   Spoke with patient and heart failure questions reviewed.  Transmission results reviewed.  Pt reports SOB for 2 days.   Last week she had gained 4 lbs but has resolved since then.  She does not have excess urine output with Bumetanide.     Discharged from rehab on 1/20    Diet:  She lives in an assisted living  Eats foods that are prepared at the facility she lives in which are not low salt.     Optivol thoracic impedance suggesting ongoing possible fluid accumulation that started 1/3.   Prescribed:  Bumetanide 2 mg take 1 tablet by mouth daily (Pt was on Torsemide 40 mg daily and Potassium prior to November hospitalization) Spironolactone 25 mg take 0.5 (12.5 mg total) tablet twice a day   Labs: 06/30/2023 Creatinine 0.69, BUN 16, Potassium 3.4, Sodium 142, GFR 84.25   05/27/2023 Creatinine 0.82, BUN 23, Potassium 4.5, Sodium 134, GFR >60  05/26/2023 Creatinine 0.57, BUN 23, Potassium 4.8, Sodium 136, GFR >60  05/24/2023 Creatinine 0.65, BUN 37, Potassium 3.8, Sodium 130, GFR >60  05/23/2023 Creatinine 0.62, BUN 37, Potassium 3.8, Sodium 132, GFR >60 05/22/2023 Creatinine 0.57, BUN 36, Potassium 4.1, Sodium 129, GFR >60  05/21/2023 Creatinine 0.54, BUN 28, Potassium 3.9, Sodium 131, GFR >60  A complete set of results can be found in Results Review.   Recommendations:  Copy sent to Dr Gala Romney for review and recommendations if needed.     Follow-up plan: ICM clinic phone appointment on 08/16/2023 to  recheck fluid levels.  91 day device clinic remote transmission 09/29/2023.     EP/Cardiology Office Visits:  09/09/2023 with Dr Gala Romney.  Recall 05/13/2023 with Francis Dowse, PA or Otilio Saber, [place].     Copy of ICM check sent to Dr. Elberta Fortis.    3 month ICM trend: 08/02/2023.    12-14 Month ICM trend:     Karie Soda, RN 08/02/2023 2:53 PM

## 2023-08-03 ENCOUNTER — Encounter: Payer: Self-pay | Admitting: Internal Medicine

## 2023-08-03 ENCOUNTER — Ambulatory Visit: Payer: Medicare Other | Admitting: Internal Medicine

## 2023-08-03 VITALS — BP 120/67 | HR 81 | Temp 97.7°F | Ht 60.0 in | Wt 175.6 lb

## 2023-08-03 DIAGNOSIS — E785 Hyperlipidemia, unspecified: Secondary | ICD-10-CM | POA: Insufficient documentation

## 2023-08-03 DIAGNOSIS — I10 Essential (primary) hypertension: Secondary | ICD-10-CM

## 2023-08-03 DIAGNOSIS — L089 Local infection of the skin and subcutaneous tissue, unspecified: Secondary | ICD-10-CM

## 2023-08-03 DIAGNOSIS — I5042 Chronic combined systolic (congestive) and diastolic (congestive) heart failure: Secondary | ICD-10-CM

## 2023-08-03 DIAGNOSIS — E876 Hypokalemia: Secondary | ICD-10-CM

## 2023-08-03 DIAGNOSIS — T148XXA Other injury of unspecified body region, initial encounter: Secondary | ICD-10-CM | POA: Diagnosis not present

## 2023-08-03 DIAGNOSIS — R10824 Left lower quadrant rebound abdominal tenderness: Secondary | ICD-10-CM | POA: Diagnosis not present

## 2023-08-03 DIAGNOSIS — N3000 Acute cystitis without hematuria: Secondary | ICD-10-CM

## 2023-08-03 LAB — BASIC METABOLIC PANEL
BUN: 19 mg/dL (ref 6–23)
CO2: 32 meq/L (ref 19–32)
Calcium: 10.8 mg/dL — ABNORMAL HIGH (ref 8.4–10.5)
Chloride: 98 meq/L (ref 96–112)
Creatinine, Ser: 0.75 mg/dL (ref 0.40–1.20)
GFR: 77.24 mL/min (ref >60.00)
Glucose, Bld: 113 mg/dL — ABNORMAL HIGH (ref 70–99)
Potassium: 3.3 meq/L — ABNORMAL LOW (ref 3.5–5.1)
Sodium: 140 meq/L (ref 135–145)

## 2023-08-03 LAB — CBC WITH DIFFERENTIAL/PLATELET
Basophils Absolute: 0.1 10*3/uL (ref 0.0–0.1)
Basophils Relative: 1 % (ref 0.0–3.0)
Eosinophils Absolute: 0.2 10*3/uL (ref 0.0–0.7)
Eosinophils Relative: 1.8 % (ref 0.0–5.0)
HCT: 36.9 % (ref 36.0–46.0)
Hemoglobin: 12 g/dL (ref 12.0–15.0)
Lymphocytes Relative: 10.6 % — ABNORMAL LOW (ref 12.0–46.0)
Lymphs Abs: 1.2 10*3/uL (ref 0.7–4.0)
MCHC: 32.5 g/dL (ref 30.0–36.0)
MCV: 78.2 fL (ref 78.0–100.0)
Monocytes Absolute: 0.6 10*3/uL (ref 0.1–1.0)
Monocytes Relative: 5.1 % (ref 3.0–12.0)
Neutro Abs: 9.4 10*3/uL — ABNORMAL HIGH (ref 1.4–7.7)
Neutrophils Relative %: 81.5 % — ABNORMAL HIGH (ref 43.0–77.0)
Platelets: 269 10*3/uL (ref 150.0–400.0)
RBC: 4.72 Mil/uL (ref 3.87–5.11)
RDW: 17.7 % — ABNORMAL HIGH (ref 11.5–15.5)
WBC: 11.5 10*3/uL — ABNORMAL HIGH (ref 4.0–10.5)

## 2023-08-03 NOTE — Progress Notes (Signed)
 Subjective:  Patient ID: Kathleen [name], female    DOB: 01-Oct-1945  Age: 78 y.o. MRN: [id number]  CC: Abdominal Pain   HPI Kathleen [name] presents for f/up ----  Discussed the use of AI scribe software for clinical note transcription with the patient, who gave verbal consent to proceed.  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  She experiences abdominal pain located approximately two inches below her ostomy site, describing it as 'odd' and lasting for a couple of hours. The pain sometimes resolves when the ostomy bag fills more. She is concerned about feces getting stuck, causing the pain. No fever or chills are present, but she has nausea earlier today without vomiting.  She reports vaginal bleeding, which she attributes to a colovascular fistula that was not repaired during her recent surgery due to time constraints. She experiences a constant discharge of yellowish fluid and requires the use of pads.  She feels extremely weak, particularly when walking from the fourth floor to the elevator and then to the car, nearly falling upon arriving at the clinic. She attributes her weakness to shortness of breath, which she has been experiencing quite a bit. A recent call from the heart clinic indicated she has 'right much fluid,' and she notes a weight increase of three to four pounds compared to her usual weight at home.  Regarding her bowel movements, she states they are generally okay but mentions being advised by a nurse or surgeon to take a laxative daily. However, another nurse advised against it, noting that her stools were too liquidy. She confirms having a bowel movement today and has had to change her ostomy bag.  She discusses her medication, specifically spironolactone, which she plans to reduce from three to one and a half tablets per day. She also mentions confusion  regarding her diabetic supplies and insulin use, noting that her last A1c indicated she might not need insulin. She recalls receiving insulin in the hospital due to surgery-related blood sugar increases and was also on prednisone, which she is now off.       Outpatient Medications Prior to Visit  Medication Sig Dispense Refill   acetaminophen (TYLENOL) 500 MG tablet Take 1,000 mg by mouth as needed.     albuterol (PROVENTIL) (2.5 MG/3ML) 0.083% nebulizer solution USE 3 ML VIA NEBULIZER THREE TIMES DAILY     bumetanide (BUMEX) 2 MG tablet Take 1 tablet (2 mg total) by mouth daily. 30 tablet 0   Cholecalciferol (VITAMIN D3) 50 MCG (2000 UT) TABS Take 2,000 Units by mouth at bedtime.     cyanocobalamin (VITAMIN B12) 1000 MCG/ML injection Inject 1 mL (1,000 mcg total) into the muscle every 30 (thirty) days. 10 mL 0   docusate sodium (COLACE) 100 MG capsule Take 100 mg by mouth daily.     dofetilide (TIKOSYN) 250 MCG capsule Take 1 capsule (250 mcg total) by mouth in the morning and at bedtime. 60 capsule 6   ELIQUIS 5 MG TABS tablet Take 1 tablet (5 mg total) by mouth 2 (two) times daily. 60 tablet 0   febuxostat (ULORIC) 40 MG tablet Take 1 tablet (40 mg total) by mouth daily. 90 tablet 0   fluticasone furoate-vilanterol (BREO ELLIPTA) 100-25 MCG/ACT AEPB INHALE 1 PUFF INTO THE LUNGS DAILY 60 each 11   gabapentin (NEURONTIN) 100 MG capsule Take 2 capsules (200  mg total) by mouth at bedtime. 60 capsule 6   insulin aspart (NOVOLOG) 100 UNIT/ML FlexPen Before each meal 3 times a day, 140-199 - 2 units, 200-250 - 4 units, 251-299 - 6 units,  300-349 - 8 units,  350 or above 10 units.     LANTUS SOLOSTAR 100 UNIT/ML Solostar Pen SMARTSIG:12 Unit(s) SUB-Q Every Morning     levalbuterol (XOPENEX) 0.63 MG/3ML nebulizer solution Take 3 mLs (0.63 mg total) by nebulization every 6 (six) hours as needed for wheezing or shortness of breath. DX J44.89 J96.21 3 mL 12   levothyroxine (SYNTHROID) 25 MCG tablet Take 1  tablet (25 mcg total) by mouth daily. 90 tablet 0   Magnesium 500 MG TABS Take 500 mg by mouth 2 (two) times daily.     methocarbamol (ROBAXIN) 500 MG tablet Take 500 mg by mouth 3 (three) times daily as needed.     metoCLOPramide (REGLAN) 10 MG tablet Take by mouth.     metroNIDAZOLE (FLAGYL) 500 MG tablet SMARTSIG:1 Tablet(s) By Mouth Every 12 Hours     mirtazapine (REMERON) 15 MG tablet Take 1 tablet (15 mg total) by mouth at bedtime. 30 tablet 0   Nutritional Supplements (,FEEDING SUPPLEMENT, PROSOURCE PLUS) liquid Take 30 mLs by mouth 2 (two) times daily between meals.     nystatin (MYCOSTATIN/NYSTOP) powder Apply topically 3 (three) times daily.     ondansetron (ZOFRAN) 4 MG tablet Take 1 tablet (4 mg total) by mouth every 8 (eight) hours as needed for nausea or vomiting.     oxyCODONE (OXY IR/ROXICODONE) 5 MG immediate release tablet Take 1-2 tablets (5-10 mg total) by mouth every 6 (six) hours as needed for moderate pain (pain score 4-6) or severe pain (pain score 7-10) (5mg  moderate, 10mg  severe). 4 tablet 0   OXYGEN Inhale 4 L into the lungs continuous. Use with resmed ventilator     pantoprazole (PROTONIX) 40 MG tablet Take 1 tablet (40 mg total) by mouth daily. 30 tablet 0   revefenacin (YUPELRI) 175 MCG/3ML nebulizer solution Take 175 mcg by nebulization daily.     rivaroxaban (XARELTO) 20 MG TABS tablet Take 1 tablet (20 mg total) by mouth daily with supper. 30 tablet 5   spironolactone (ALDACTONE) 25 MG tablet Take 0.5 tablets (12.5 mg total) by mouth 2 (two) times daily. 30 tablet 3   triamcinolone cream (KENALOG) 0.1 % Apply 1 Application topically 2 (two) times daily. 45 g 0   Zinc Oxide (DESITIN MAXIMUM STRENGTH) 40 % PSTE Apply 1 Application topically 4 (four) times daily. Mix with aquaphor and apply to inflamed area     potassium chloride (KLOR-CON) 10 MEQ tablet Take 40 mEq by mouth daily.     torsemide (DEMADEX) 20 MG tablet Take 2 tablets (40 mg total) by mouth daily. 60  tablet 0   No facility-administered medications prior to visit.    ROS Review of Systems  Constitutional:  Negative for appetite change, chills, diaphoresis, fatigue, fever and unexpected weight change.  HENT: Negative.    Respiratory:  Positive for shortness of breath. Negative for cough, chest tightness and wheezing.   Cardiovascular:  Negative for chest pain, palpitations and leg swelling.  Gastrointestinal:  Positive for abdominal pain. Negative for blood in stool, constipation, diarrhea, nausea and vomiting.  Genitourinary:  Positive for dysuria and vaginal bleeding. Negative for difficulty urinating, flank pain and frequency.  Musculoskeletal: Negative.   Skin: Negative.   Neurological:  Positive for weakness. Negative for dizziness and light-headedness.  Hematological:  Negative for adenopathy. Does not bruise/bleed easily.  Psychiatric/Behavioral:  Positive for confusion and decreased concentration.     Objective:  BP 120/67 (BP Location: Right Arm, Patient Position: Sitting, Cuff Size: Small) Comment (BP Location): forearm  Pulse 81   Temp 97.7 F (36.5 C) (Oral)   Ht 5' (1.524 m)   Wt 175 lb 9.6 oz (79.7 kg)   SpO2 96%   BMI 34.29 kg/m   BP Readings from Last 3 Encounters:  08/03/23 120/67  07/02/23 (!) 153/88  06/30/23 132/84    Wt Readings from Last 3 Encounters:  08/03/23 175 lb 9.6 oz (79.7 kg)  07/02/23 176 lb 6 oz (80 kg)  06/30/23 178 lb 4 oz (80.9 kg)    Physical Exam Vitals reviewed.  Constitutional:      General: She is not in acute distress.    Appearance: She is obese. She is ill-appearing. She is not toxic-appearing or diaphoretic.  HENT:     Mouth/Throat:     Mouth: Mucous membranes are moist.  Eyes:     General: No scleral icterus.    Conjunctiva/sclera: Conjunctivae normal.  Cardiovascular:     Rate and Rhythm: Normal rate.     Pulses: Normal pulses.     Heart sounds: No murmur heard.    No friction rub. No gallop.  Pulmonary:      Effort: Pulmonary effort is normal.     Breath sounds: No stridor. No wheezing, rhonchi or rales.  Abdominal:     General: Abdomen is protuberant. Bowel sounds are normal. There is no distension.     Palpations: There is no hepatomegaly, splenomegaly or mass.     Tenderness: There is abdominal tenderness in the suprapubic area and left lower quadrant. There is no guarding or rebound.     Hernia: No hernia is present.  Musculoskeletal:        General: Normal range of motion.     Cervical back: Neck supple.     Right lower leg: Edema (trace pitting) present.     Left lower leg: Edema (trace pitting) present.  Skin:    Findings: Erythema and rash present.  Neurological:     Mental Status: She is alert. Mental status is at baseline.     Lab Results  Component Value Date   WBC 11.5 (H) 08/03/2023   HGB 12.0 08/03/2023   HCT 36.9 08/03/2023   PLT 269.0 08/03/2023   GLUCOSE 113 (H) 08/03/2023   CHOL 174 08/07/2021   TRIG 145 05/24/2023   HDL 35.00 (L) 08/07/2021   LDLCALC 99 08/07/2021   ALT 7 06/30/2023   AST 10 06/30/2023   NA 140 08/03/2023   K 3.3 (L) 08/03/2023   CL 98 08/03/2023   CREATININE 0.75 08/03/2023   BUN 19 08/03/2023   CO2 32 08/03/2023   TSH 7.41 (H) 06/30/2023   INR 1.7 (H) 04/22/2023   HGBA1C 5.9 06/30/2023   MICROALBUR 0.8 08/07/2021    DG Chest Port 1 View Result Date: 04/22/2023 CLINICAL DATA:  Dark stool, bowel perforation EXAM: PORTABLE CHEST 1 VIEW COMPARISON:  CT 04/22/2023,, chest x-ray 04/09/2023 FINDINGS: Left-sided multi lead pacing device as before. Enlarged cardiomediastinal silhouette with vascular congestion. Aortic atherosclerosis. Minimal atelectasis or scarring at left base. No pleural effusion. IMPRESSION: Enlarged cardiomediastinal silhouette with mild vascular congestion. Electronically Signed   By: Jasmine Pang M.D.   On: 04/22/2023 22:01   CT ABDOMEN PELVIS W CONTRAST Result Date: 04/22/2023 CLINICAL DATA:  Sigmoid  diverticulitis.  Colovaginal fistula. Chronic constipation. EXAM: CT ABDOMEN AND PELVIS WITH CONTRAST TECHNIQUE: Multidetector CT imaging of the abdomen and pelvis was performed using the standard protocol following bolus administration of intravenous contrast. RADIATION DOSE REDUCTION: This exam was performed according to the departmental dose-optimization program which includes automated exposure control, adjustment of the mA and/or kV according to patient size and/or use of iterative reconstruction technique. CONTRAST:  OMNIPAQUE IOHEXOL 300 MG/ML  SOLN COMPARISON:  04/09/2023 FINDINGS: Lower Chest: No acute findings. Hepatobiliary: No suspicious hepatic masses identified. Prior cholecystectomy. No evidence of biliary obstruction. Pancreas:  No mass or inflammatory changes. Spleen: Within normal limits in size and appearance. Adrenals/Urinary Tract: No suspicious masses identified. No evidence of ureteral calculi or hydronephrosis. Stomach/Bowel: Moderate sigmoid diverticulitis has increased since previous study. New focal inflammatory collection with internal gas bubbles seen in the central sigmoid mesocolon measuring 4.1 x 3.0 cm, consistent with localized perforation. No No evidence of free intraperitoneal air. A new rim enhancing fluid collection is seen in the right perirectal region measuring 5.6 x 4.0 cm, consistent with abscess. A new rim enhancing fluid collection is also seen in the anterior left lower quadrant which measures 6.0 x 4.1 cm, also consistent with abscess. A small amount of gas is again seen in the vagina, suspicious for colovaginal fistula. Increased mild reactive wall thickening of the adjacent urinary bladder also seen. Vascular/Lymphatic: No pathologically enlarged lymph nodes. No acute vascular findings. Reproductive:  Prior hysterectomy. Other:  None. Musculoskeletal:  No suspicious bone lesions identified. IMPRESSION: Worsening sigmoid diverticulitis, with new 4.1 cm focal inflammatory  collection with gas bubbles in the central sigmoid mesocolon, consistent with localized perforation. New 5.6 cm right perirectal abscess. New 6.0 cm left lower quadrant abscess. Persistent small amount of gas in the vagina, suspicious for colovaginal fistula. Electronically Signed   By: Danae Orleans M.D.   On: 04/22/2023 16:43   CT ABDOMEN PELVIS W CONTRAST Result Date: 08/05/2023 CLINICAL DATA:  Left lower quadrant rebound tenderness EXAM: CT ABDOMEN AND PELVIS WITH CONTRAST TECHNIQUE: Multidetector CT imaging of the abdomen and pelvis was performed using the standard protocol following bolus administration of intravenous contrast. RADIATION DOSE REDUCTION: This exam was performed according to the departmental dose-optimization program which includes automated exposure control, adjustment of the mA and/or kV according to patient size and/or use of iterative reconstruction technique. CONTRAST:  ISOVUE-300 IOPAMIDOL (ISOVUE-300) INJECTION 61% COMPARISON:  05/23/2023 FINDINGS: Lower chest: No acute pleural or parenchymal lung disease. Hepatobiliary: Scattered hypodensities throughout the liver consistent with multiple cysts. No other focal liver abnormalities. No biliary duct dilation. Prior cholecystectomy. Pancreas: Unremarkable. No pancreatic ductal dilatation or surrounding inflammatory changes. Spleen: Normal in size without focal abnormality. Adrenals/Urinary Tract: Stable appearance of the kidneys. No urinary tract calculi or obstructive uropathy. The adrenals are unremarkable. The bladder is decompressed, limiting its evaluation. There does appear to be some asymmetric wall thickening along the left posterolateral aspect of the bladder, measuring up to 7 mm. Please correlate with urinalysis for evidence of cystitis. Stomach/Bowel: Postsurgical changes from distal colectomy, with diverting colostomy and Hartman pouch. No evidence of bowel obstruction or ileus. Mobile cecum located in the left mid  abdomen. No bowel wall thickening or inflammatory change. Retained dense contrast material within the [employer]. Vascular/Lymphatic: Aortic atherosclerosis. No enlarged abdominal or pelvic lymph nodes. Reproductive: Status post hysterectomy. No adnexal masses. Other: No free fluid or free intraperitoneal gas. The fluid collection within the lower central abdomen on prior study has  resolved in the interim. No new fluid collections or evidence of abscess. No abdominal wall hernia. Musculoskeletal: No acute or destructive bony abnormalities. Reconstructed images demonstrate no additional findings. IMPRESSION: 1. Stable postsurgical changes from partial colectomy, left lower quadrant diverting colostomy, and Hartmann pouch. No evidence of postoperative complication. The fluid collection within the lower central abdomen on prior study has resolved in the interim. 2. Nonspecific bladder wall thickening given decompressed state. Please correlate with urinalysis for any evidence of cystitis. 3. No bowel obstruction or ileus. 4.  Aortic Atherosclerosis (ICD10-I70.0). Electronically Signed   By: Sharlet Salina M.D.   On: 08/05/2023 17:32   DG Foot Complete Left Result Date: 07/07/2023 Please see detailed radiograph report in office note.  CUP PACEART REMOTE DEVICE CHECK Result Date: 06/30/2023 Scheduled remote reviewed. Normal device function.  HF diagnostics currently abnormal, followed by I[facility] Next remote 91 days. LA, CV[facility]  DG Chest Port 1 View Result Date: 05/25/2023 CLINICAL DATA:  Short of breath, cough EXAM: PORTABLE CHEST 1 VIEW COMPARISON:  05/04/2023, 05/23/2023 FINDINGS: Single frontal view of the chest demonstrates stable multi lead pacer. Right-sided PICC tip overlies superior vena cava. The cardiac silhouette is enlarged but stable. Chronic atelectasis at the left lung base. Stable left pleural thickening based on prior CT exam. No airspace disease, effusion, or pneumothorax. IMPRESSION: 1. Stable  left basilar atelectasis or scarring and chronic left pleural thickening. 2. No acute intrathoracic process. Electronically Signed   By: Sharlet Salina M.D.   On: 05/25/2023 09:27   CT ABDOMEN PELVIS WO CONTRAST Result Date: 05/23/2023 CLINICAL DATA:  Postoperative abdominal pain, follow-up abscesses. Inpatient. EXAM: CT ABDOMEN AND PELVIS WITHOUT CONTRAST TECHNIQUE: Multidetector CT imaging of the abdomen and pelvis was performed following the standard protocol without IV contrast. RADIATION DOSE REDUCTION: This exam was performed according to the departmental dose-optimization program which includes automated exposure control, adjustment of the mA and/or kV according to patient size and/or use of iterative reconstruction technique. COMPARISON:  05/16/2023 CT abdomen/pelvis. FINDINGS: Lower chest: Mild platelike bibasilar atelectasis. Subpleural 0.4 cm peripheral right lower lobe pulmonary nodule (series 4/image 8), decreased from 0.6 cm. Pacemaker leads partially visualized in the right atrium and right ventricle. Ovoid 1.1 cm hyperdense structure in the lower thoracic esophagus. Hepatobiliary: Top-normal liver size. Simple 1.1 cm lateral segment left liver cyst with additional scattered subcentimeter hypodense liver lesions are too small to characterize and unchanged. No new liver lesions. Cholecystectomy no biliary ductal dilatation. Pancreas: Normal, with no mass or duct dilation. Spleen: Normal size. No mass. Adrenals/Urinary Tract: Normal adrenals. No hydronephrosis. No renal stones. Simple 3.0 cm exophytic lateral lower left renal cyst, for which no follow-up imaging is recommended. Normal bladder. Stomach/Bowel: Normal non-distended stomach. 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 No dilated or thick-walled small bowel loops. Appendix not discretely visualized. Mobile cecum located in the anterior abdomen along the midline.  Retained enteric dense contrast in the collapsed Hartmann pouch. Right-sided surgical drains terminate in the left pericolic gutter and right lower quadrant, unchanged. Small 3.5 x 1.7 cm postoperative collection in the posterior right lower peritoneal cavity (series 3/image 47), previously 3.8 x 2.2 cm, slightly decreased. No new fluid collections. Vascular/Lymphatic: Atherosclerotic nonaneurysmal abdominal aorta. No pathologically enlarged lymph nodes in the abdomen or pelvis. Reproductive: Status post hysterectomy, with no abnormal findings at the vaginal cuff. No adnexal mass. Other: No pneumoperitoneum. No ascites. Scattered fat stranding and ill-defined fluid in the  midline ventral abdominal laparotomy site without measurable fluid collection. Musculoskeletal: No aggressive appearing focal osseous lesions. IMPRESSION: 1. 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 2. Small postoperative collection in the posterior right lower peritoneal cavity, slightly decreased. No new fluid collections. 3. Scattered fat stranding and ill-defined fluid in the midline ventral abdominal laparotomy site without measurable fluid collection. 4. Ovoid 1.1 cm hyperdense structure in the lower thoracic esophagus, cannot exclude a ingested foreign body, either retained oral contrast or pill fragment. 5. Subpleural 0.4 cm peripheral right lower lobe pulmonary nodule, decreased from 0.6 cm, favoring a benign etiology. 6.  Aortic Atherosclerosis (ICD10-I70.0). Electronically Signed   By: Delbert Phenix M.D.   On: 05/23/2023 11:37   DG Abd Portable 1V Result Date: 05/19/2023 CLINICAL DATA:  Abdominal pain EXAM: PORTABLE ABDOMEN - 1 VIEW COMPARISON:  05/16/2023 CT FINDINGS: Scattered large and small bowel gas is noted. Left lower quadrant ostomy is seen. Contrast is noted within the rectal stump. Multiple surgical drains are seen in place. No free air is seen.  IMPRESSION: No acute abnormality noted. Electronically Signed   By: Alcide Clever M.D.   On: 05/19/2023 19:02   CT ABDOMEN PELVIS W CONTRAST Result Date: 05/16/2023 CLINICAL DATA:  Abdominal pain.  Postop. EXAM: CT ABDOMEN AND PELVIS WITH CONTRAST TECHNIQUE: Multidetector CT imaging of the abdomen and pelvis was performed using the standard protocol following bolus administration of intravenous contrast. RADIATION DOSE REDUCTION: This exam was performed according to the departmental dose-optimization program which includes automated exposure control, adjustment of the mA and/or kV according to patient size and/or use of iterative reconstruction technique. CONTRAST:  75mL OMNIPAQUE IOHEXOL 350 MG/ML SOLN COMPARISON:  05/09/2023. FINDINGS: Lower chest: Mild dependent atelectasis, left greater than right, left improved from the prior CT. Hepatobiliary: Normal liver size and overall attenuation. Scattered small cysts, stable. Prior cholecystectomy. No bile duct dilation. Pancreas: Unremarkable. Spleen: Normal. Adrenals/Urinary Tract: No adrenal mass. Stable lower pole left renal cyst, with no follow-up recommended. Kidneys with symmetric enhancement and excretion. No hydronephrosis. Ureters normal in course and in caliber. Bladder mildly distended, unremarkable. Stomach/Bowel: Status post left colon resection, formation of a Hartmann's pouch and a left lower quadrant colostomy. Postsurgical lung inflammatory changes are noted in the low abdomen and pelvis with associated surgical drains, stable from the previous CT. Small collection lies superior and to the right of the Hartmann's pouch measuring 3.8 x 2.2 cm, 3.6 x 1.6 cm on the prior CT. There are no other defined collections. Small-bowel dilation with air-fluid levels, with less distension than on the prior CT. No small bowel wall thickening. No colonic dilation, wall thickening or inflammation. Vascular/Lymphatic: Aortic atherosclerosis. No aneurysm. No enlarged  lymph nodes. Reproductive: Status post hysterectomy. Other: No free air.  Open midline incision to the fascia. Musculoskeletal: No acute findings or change from the prior CT. IMPRESSION: 1. Postsurgical changes related to left colon resection, formation of a left lower quadrant colostomy and formation of a Hartmann's pouch. Small collection adjacent to the pouch is minimally larger than on the prior CT. No other defined collections. 2. Small-bowel dilation with air-fluid levels, consistent with an ileus, mildly improved from the prior CT. 3. Aortic atherosclerosis. Aortic Atherosclerosis (ICD10-I70.0). Electronically Signed   By: Amie Portland M.D.   On: 05/16/2023 13:00   DG Abd Portable 1V Result Date: 05/14/2023 CLINICAL DATA:  78 year old female with abdominal pain and vomiting. Status post abdominal surgery, descending  colostomy. EXAM: PORTABLE ABDOMEN - 1 VIEW COMPARISON:  Portable chest and CT Abdomen and Pelvis 05/09/2023. FINDINGS: Portable AP supine views at 0330 hours. Stable lung bases. Cardiac pacemaker leads again noted. Retrocardiac hypo ventilation on going. Enteric tube has been removed since 05/09/2023. Gas-filled small and large bowel loops (transverse colon). The small bowel is less dilated compared to 05/09/2023, but not yet normal. Similar volume of gas in the stomach. Left lower quadrant ostomy is evident. Abdominal drain remains in place across the lower abdomen and pelvis. No pneumoperitoneum is apparent on this supine view. Stable cholecystectomy clips. Stable visualized osseous structures. IMPRESSION: 1. Improved but not normalized bowel gas pattern since 05/09/2023. Pattern most resembles ileus. 2. Lower abdominal, enteric tube removed. Lower abdominal, pelvic drain remains in place. 3. Left lung base hypo ventilation. Electronically Signed   By: Odessa Fleming M.D.   On: 05/14/2023 04:27        Assessment & Plan:   Left lower quadrant abdominal tenderness with rebound tenderness- CT  is reassuring. -     CT ABDOMEN PELVIS W CONTRAST; Future -     Basic metabolic panel; Future -     CBC with Differential/Platelet; Future -     Urinalysis, Routine w reflex microscopic; Future -     CULTURE, URINE COMPREHENSIVE; Future  Wound infection- Clx is c/w normal skin flora.  -     WOUND CULTURE; Future  Primary hypertension -     Potassium Chloride ER; Take 1 tablet (10 mEq total) by mouth 3 (three) times daily.  Dispense: 270 tablet; Refill: 0  Hypokalemia -     Potassium Chloride ER; Take 1 tablet (10 mEq total) by mouth 3 (three) times daily.  Dispense: 270 tablet; Refill: 0  Chronic combined systolic and diastolic heart failure (HCC) -     Torsemide; Take 2 tablets (40 mg total) by mouth daily.  Dispense: 60 tablet; Refill: 0  Acute cystitis without hematuria -     Nitrofurantoin Monohyd Macro; Take 1 capsule (100 mg total) by mouth 2 (two) times daily for 5 days.  Dispense: 10 capsule; Refill: 0     Follow-up: Return if symptoms worsen or fail to improve.  Sanda Linger, MD

## 2023-08-03 NOTE — Patient Instructions (Signed)
 Abdominal Pain, Adult  Pain in the abdomen (abdominal pain) can be caused by many things. In most cases, it gets better with no treatment or by being treated at home. But in some cases, it can be serious. Your health care provider will ask questions about your medical history and do a physical exam to try to figure out what is causing your pain. Follow these instructions at home: Medicines Take over-the-counter and prescription medicines only as told by your provider. Do not take medicines that help you poop (laxatives) unless told by your provider. General instructions Watch your condition for any changes. Drink enough fluid to keep your pee (urine) pale yellow. Contact a health care provider if: Your pain changes, gets worse, or lasts longer than expected. You have severe cramping or bloating in your abdomen, or you vomit. Your pain gets worse with meals, after eating, or with certain foods. You are constipated or have diarrhea for more than 2-3 days. You are not hungry, or you lose weight without trying. You have signs of dehydration. These may include: Dark pee, very little pee, or no pee. Cracked lips or dry mouth. Sleepiness or weakness. You have pain when you pee (urinate) or poop. Your abdominal pain wakes you up at night. You have blood in your pee. You have a fever. Get help right away if: You cannot stop vomiting. Your pain is only in one part of the abdomen. Pain on the right side could be caused by appendicitis. You have bloody or black poop (stool), or poop that looks like tar. You have trouble breathing. You have chest pain. These symptoms may be an emergency. Get help right away. Call 911. Do not wait to see if the symptoms will go away. Do not drive yourself to the hospital. This information is not intended to replace advice given to you by your health care provider. Make sure you discuss any questions you have with your health care provider. Document Revised:  03/11/2022 Document Reviewed: 03/11/2022 Elsevier Patient Education  2024 [employer].

## 2023-08-04 DIAGNOSIS — L089 Local infection of the skin and subcutaneous tissue, unspecified: Secondary | ICD-10-CM | POA: Insufficient documentation

## 2023-08-05 ENCOUNTER — Ambulatory Visit
Admission: RE | Admit: 2023-08-05 | Discharge: 2023-08-05 | Discharge status: Home / Self Care | Payer: Medicare Other | Source: Ambulatory Visit | Attending: Internal Medicine | Admitting: Internal Medicine

## 2023-08-05 DIAGNOSIS — R10824 Left lower quadrant rebound abdominal tenderness: Secondary | ICD-10-CM

## 2023-08-05 MED ORDER — IOPAMIDOL (ISOVUE-300) INJECTION 61%
100.0000 mL | Freq: Once | INTRAVENOUS | Status: AC | PRN
  Administered 2023-08-05: 100 mL via INTRAVENOUS

## 2023-08-06 ENCOUNTER — Encounter: Payer: Self-pay | Admitting: Internal Medicine

## 2023-08-06 MED ORDER — POTASSIUM CHLORIDE ER 10 MEQ PO TBCR: 10.0000 meq | EXTENDED_RELEASE_TABLET | Freq: Three times a day (TID) | ORAL | 0 refills | Status: DC

## 2023-08-06 NOTE — Progress Notes (Addendum)
 No recommendations received from Dr Gala Romney.

## 2023-08-08 LAB — WOUND CULTURE

## 2023-08-09 ENCOUNTER — Encounter: Payer: Self-pay | Admitting: Internal Medicine

## 2023-08-09 MED ORDER — TORSEMIDE 20 MG PO TABS: 40.0000 mg | ORAL_TABLET | Freq: Every day | ORAL | 0 refills | Status: DC

## 2023-08-10 ENCOUNTER — Encounter: Payer: Self-pay | Admitting: Internal Medicine

## 2023-08-10 ENCOUNTER — Other Ambulatory Visit (INDEPENDENT_AMBULATORY_CARE_PROVIDER_SITE_OTHER)

## 2023-08-10 DIAGNOSIS — R10824 Left lower quadrant rebound abdominal tenderness: Secondary | ICD-10-CM | POA: Diagnosis not present

## 2023-08-10 LAB — URINALYSIS, ROUTINE W REFLEX MICROSCOPIC
Bilirubin Urine: NEGATIVE
Ketones, ur: NEGATIVE
Nitrite: NEGATIVE
Specific Gravity, Urine: 1.015 (ref 1.000–1.030)
Total Protein, Urine: NEGATIVE
Urine Glucose: NEGATIVE
Urobilinogen, UA: 0.2 (ref 0.0–1.0)
pH: 6 (ref 5.0–8.0)

## 2023-08-10 MED ORDER — NITROFURANTOIN MONOHYD MACRO 100 MG PO CAPS: 100.0000 mg | ORAL_CAPSULE | Freq: Two times a day (BID) | ORAL | 0 refills | Status: AC

## 2023-08-12 NOTE — Progress Notes (Signed)
 Remote pacemaker transmission.

## 2023-08-13 ENCOUNTER — Ambulatory Visit (HOSPITAL_COMMUNITY)
Admission: RE | Admit: 2023-08-13 | Discharge: 2023-08-13 | Disposition: A | Discharge status: Home / Self Care | Payer: Medicare Other | Source: Ambulatory Visit | Attending: Plastic Surgery | Admitting: Plastic Surgery

## 2023-08-13 ENCOUNTER — Other Ambulatory Visit (HOSPITAL_COMMUNITY): Payer: Self-pay | Admitting: Nurse Practitioner

## 2023-08-13 DIAGNOSIS — I4811 Longstanding persistent atrial fibrillation: Secondary | ICD-10-CM | POA: Diagnosis not present

## 2023-08-13 DIAGNOSIS — I130 Hypertensive heart and chronic kidney disease with heart failure and stage 1 through stage 4 chronic kidney disease, or unspecified chronic kidney disease: Secondary | ICD-10-CM | POA: Insufficient documentation

## 2023-08-13 DIAGNOSIS — Z933 Colostomy status: Secondary | ICD-10-CM | POA: Diagnosis present

## 2023-08-13 DIAGNOSIS — Z433 Encounter for attention to colostomy: Secondary | ICD-10-CM

## 2023-08-13 DIAGNOSIS — L24B3 Irritant contact dermatitis related to fecal or urinary stoma or fistula: Secondary | ICD-10-CM

## 2023-08-13 LAB — CULTURE, URINE COMPREHENSIVE

## 2023-08-13 NOTE — Progress Notes (Signed)
 [facility]   Reason for visit:  LLQ colostomy.  Frequent loss of seal due to flatus HPI:  Diverticulitis with perforation and end colostomy 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    Concussion 10/04/2021   COPD (chronic obstructive pulmonary disease) (HCC)    Diverticulitis    Gout    Hypertension    Longstanding persistent atrial fibrillation (HCC)    on Xarelto   Nonischemic cardiomyopathy (HCC)    Obesity    Pacemaker    Spontaneous pneumothorax 2013   Family History  Problem Relation Age of Onset   Breast cancer Mother 14       3 different times   Pancreatic cancer Mother    Emphysema Father    Healthy Brother    Breast cancer Cousin    Valvular heart disease Son    Obesity Daughter    Colon cancer Neg Hx    Esophageal cancer Neg Hx    Stomach cancer Neg Hx    Inflammatory bowel disease Neg Hx    Liver disease Neg Hx    Rectal cancer Neg Hx    Allergies  Allergen Reactions   Allopurinol Other (See Comments)    Reaction:  Dizziness    Clindamycin Anaphylaxis and Hives   Flublok [Influenza Vaccine Recombinant] Other (See Comments)    Fever 103 with no alternative explanation day after vaccine.  Clydie Braun Highfill FNP-C   Pneumococcal 13-Val Conj Vacc Itching, Swelling and Rash   Dronedarone Rash   Montelukast Other (See Comments)    Makes her loopy.   Brovana [Arformoterol]     Caused muscle pain   Budesonide     Caused extreme joint pain   Entresto [Sacubitril-Valsartan] Other (See Comments)    hypotension   Fosamax [Alendronate Sodium] Nausea Only   Jardiance [Empagliflozin]     Caused a vaginal infection   Meperidine Nausea And Vomiting   Microplegia Msa-Msg [Plegisol]     Loopy,diarrhea   Rosuvastatin Other (See Comments)    Reaction:   Muscle spasms    Tetracycline Hives   Adhesive [Tape] Rash   Lovastatin Rash and Other (See Comments)    Muscle Pain   Current Outpatient Medications  Medication Sig Dispense Refill Last Dose/Taking   acetaminophen (TYLENOL) 500 MG tablet Take 1,000 mg by mouth as needed.      albuterol (PROVENTIL) (2.5 MG/3ML) 0.083% nebulizer solution USE 3 ML VIA NEBULIZER THREE TIMES DAILY      bumetanide (BUMEX) 2 MG tablet Take 1 tablet (2 mg total) by mouth daily. 30 tablet 0    Cholecalciferol (VITAMIN D3) 50 MCG (2000 UT) TABS Take 2,000 Units by mouth at bedtime.      cyanocobalamin (VITAMIN B12) 1000 MCG/ML injection Inject 1 mL (1,000 mcg total) into the muscle every 30 (thirty) days. 10 mL 0    docusate sodium (COLACE) 100 MG capsule Take 100 mg by mouth daily.      dofetilide (TIKOSYN) 250 MCG capsule Take 1 capsule (250 mcg total) by mouth in the morning and at bedtime. 60 capsule 6    ELIQUIS 5 MG TABS tablet Take 1 tablet (5 mg total) by mouth 2 (two) times daily. 60 tablet 0    febuxostat (ULORIC) 40 MG tablet Take 1  tablet (40 mg total) by mouth daily. 90 tablet 0    fluticasone furoate-vilanterol (BREO ELLIPTA) 100-25 MCG/ACT AEPB INHALE 1 PUFF INTO THE LUNGS DAILY 60 each 11    gabapentin (NEURONTIN) 100 MG capsule Take 2 capsules (200 mg total) by mouth at bedtime. 60 capsule 6    insulin aspart (NOVOLOG) 100 UNIT/ML FlexPen Before each meal 3 times a day, 140-199 - 2 units, 200-250 - 4 units, 251-299 - 6 units,  300-349 - 8 units,  350 or above 10 units.      LANTUS SOLOSTAR 100 UNIT/ML Solostar Pen SMARTSIG:12 Unit(s) SUB-Q Every Morning      levalbuterol (XOPENEX) 0.63 MG/3ML nebulizer solution Take 3 mLs (0.63 mg total) by nebulization every 6 (six) hours as needed for wheezing or shortness of breath. DX J44.89 J96.21 3 mL 12    levothyroxine (SYNTHROID) 25 MCG tablet Take 1 tablet (25 mcg total) by mouth daily. 90 tablet 0    Magnesium 500 MG TABS Take 500 mg by mouth 2 (two) times  daily.      methocarbamol (ROBAXIN) 500 MG tablet Take 500 mg by mouth 3 (three) times daily as needed.      metoCLOPramide (REGLAN) 10 MG tablet Take by mouth.      metroNIDAZOLE (FLAGYL) 500 MG tablet SMARTSIG:1 Tablet(s) By Mouth Every 12 Hours      mirtazapine (REMERON) 15 MG tablet Take 1 tablet (15 mg total) by mouth at bedtime. 30 tablet 0    nitrofurantoin, macrocrystal-monohydrate, (MACROBID) 100 MG capsule Take 1 capsule (100 mg total) by mouth 2 (two) times daily for 5 days. 10 capsule 0    Nutritional Supplements (,FEEDING SUPPLEMENT, PROSOURCE PLUS) liquid Take 30 mLs by mouth 2 (two) times daily between meals.      nystatin (MYCOSTATIN/NYSTOP) powder Apply topically 3 (three) times daily.      ondansetron (ZOFRAN) 4 MG tablet Take 1 tablet (4 mg total) by mouth every 8 (eight) hours as needed for nausea or vomiting.      oxyCODONE (OXY IR/ROXICODONE) 5 MG immediate release tablet Take 1-2 tablets (5-10 mg total) by mouth every 6 (six) hours as needed for moderate pain (pain score 4-6) or severe pain (pain score 7-10) (5mg  moderate, 10mg  severe). 4 tablet 0    OXYGEN Inhale 4 L into the lungs continuous. Use with resmed ventilator      pantoprazole (PROTONIX) 40 MG tablet Take 1 tablet (40 mg total) by mouth daily. 30 tablet 0    potassium chloride (KLOR-CON) 10 MEQ tablet Take 1 tablet (10 mEq total) by mouth 3 (three) times daily. 270 tablet 0    revefenacin (YUPELRI) 175 MCG/3ML nebulizer solution Take 175 mcg by nebulization daily.      rivaroxaban (XARELTO) 20 MG TABS tablet Take 1 tablet (20 mg total) by mouth daily with supper. 30 tablet 5    spironolactone (ALDACTONE) 25 MG tablet Take 0.5 tablets (12.5 mg total) by mouth 2 (two) times daily. 30 tablet 3    torsemide (DEMADEX) 20 MG tablet Take 2 tablets (40 mg total) by mouth daily. 60 tablet 0    triamcinolone cream (KENALOG) 0.1 % Apply 1 Application topically 2 (two) times daily. 45 g 0    Zinc Oxide (DESITIN MAXIMUM  STRENGTH) 40 % PSTE Apply 1 Application topically 4 (four) times daily. Mix with aquaphor and apply to inflamed area      No current facility-administered medications for this encounter.   ROS  Review of Systems  Constitutional:  Positive for fatigue.  Gastrointestinal:        LMQ colostomy  Musculoskeletal:  Positive for gait problem.  All other systems reviewed and are negative.  Vital signs:  BP (P) 138/64 (BP Location: Left Arm)   Pulse (P) 76   Temp (P) 98.8 F (37.1 C) (Oral)   Resp (P) 18   SpO2 (P) 97%  Exam:  Physical Exam Vitals reviewed.  Cardiovascular:     Rate and Rhythm: Normal rate and regular rhythm.  Pulmonary:     Effort: Pulmonary effort is normal.  Abdominal:     Palpations: Abdomen is soft.  Skin:    General: Skin is warm and dry.     Findings: Erythema and rash present.  Neurological:     Mental Status: She is alert and oriented to person, place, and time. Mental status is at baseline.  Psychiatric:        Mood and Affect: Mood normal.        Behavior: Behavior normal.     Stoma type/location:  LLQ colostomy, flush from 12 to 6 o'clock, os points towards 6 o'clock Stomal assessment/size:  31 mm pink and moist   Peristomal assessment:  denuded skin from 3 to 9 o'clock, moist and weeping  Treatment options for stomal/peristomal skin: stoma powder and skin prep  barrier ring and 1 piece pouch.  Switching to 1 piece convex with a belt.  Output: thick brown stool Ostomy pouching: 1pc. Convex With barrier ring and ostomy belt  Education provided:  discussed rationale for convex pouch, due to flush stoma.  Stoma powder and skin prep to denuded skin  Ostomy belt to hold pouch securely and barrier ring to promote seal.  Filtered pouch to manage flatus.  HH is discharging.  Will set up with [facility]    Impression/dx  Colostomy Contact irritant dermatitis Discussion  See above  convex pouch with belt and filter Set up with [facility]  Plan  See back 2  weeks to assess new pouching system    Visit time: 45 minutes.   Mike Gip FNP-BC

## 2023-08-13 NOTE — Discharge Instructions (Addendum)
 1 piece coloplast convex pouch presized 31 mm  [id number]  [facility]  [id number] Stoma powder  item # [id number] Skin prep item # [id number] Barrier ring  130865 Add wound care supplies  island dressing with silver    [email or web address]

## 2023-08-16 ENCOUNTER — Ambulatory Visit: Payer: Medicare Other | Attending: Cardiology

## 2023-08-16 DIAGNOSIS — Z950 Presence of cardiac pacemaker: Secondary | ICD-10-CM

## 2023-08-16 DIAGNOSIS — I5022 Chronic systolic (congestive) heart failure: Secondary | ICD-10-CM

## 2023-08-17 ENCOUNTER — Ambulatory Visit: Payer: Self-pay | Admitting: Internal Medicine

## 2023-08-17 ENCOUNTER — Telehealth: Payer: Self-pay | Admitting: Internal Medicine

## 2023-08-17 NOTE — Telephone Encounter (Signed)
  Chief Complaint: wound Symptoms: abd wound around ostomy Frequency: about a week Pertinent Negatives: Patient denies chills, fever Disposition: [] ED /[] Urgent Care (no appt availability in office) / [x] Appointment(In office/virtual)/ []  [facility]/ [] Home Care/ [x] Refused Recommended Disposition /[] [facility]/ []  Follow-up with PCP Additional Notes: Patient states her home health nurse changed the dressing to her left abd wound next to her ostomy and advised her to call in to see about antibiotics the wound.  Offered to schedule appt for tomorrow patient refused due to transportation because her son has work and states that she has an appt on Friday with her Careers adviser. Patient states that her wound came back positive for staph.     Reason for Disposition  [1] Red area or streak AND [2] no fever  Answer Assessment - Initial Assessment Questions 1. LOCATION: "Where is the wound located?"      Surgical wound near ostomy, left side 2. WOUND APPEARANCE: "What does the wound look like?"      red 3. SIZE: If redness is present, ask: "What is the size of the red area?" (Inches, centimeters, or compare to size of a coin)      9in wide bright red 4. SPREAD: "What's changed in the last day?"  "Do you see any red streaks coming from the wound?"     Dressing changed today by home health 5. ONSET: "When did it start to look infected?"      Last week, but looks worse this week.  6. MECHANISM: "How did the wound start, what was the cause?"     Ostomy surgery 7. PAIN: "Is there any pain?" If Yes, ask: "How bad is the pain?"   (Scale 1-10; or mild, moderate, severe)     mild 8. FEVER: "Do you have a fever?" If Yes, ask: "What is your temperature, how was it measured, and when did it start?"     No fever when nurse took vitals 9. OTHER SYMPTOMS: "Do you have any other symptoms?" (e.g., shaking chills, weakness, rash elsewhere on body)     none  Protocols used: Wound  Infection-A-AH

## 2023-08-17 NOTE — Progress Notes (Signed)
 EPIC Encounter for ICM Monitoring  Patient Name: Kathleen [name] is a 78 y.o. female Date: 08/17/2023 Primary Care Physican: [name], MD Primary Cardiologist: Bensimhon Electrophysiologist: Elberta Fortis Bi-V Pacing: 100%          03/16/2023 Weight: 190 lbs       03/23/2023 Weight: 188 lbs   04/06/2023 Weight: 176 lbs    06/30/2023 Weight: 178 lbs 08/02/2023 Weight: 172 lbs   Clinical Status Since 20-Jul-2023 Time in AT/AF <0.1 hr/day (<0.1%)                                   Attempted call to patient and unable to reach.   Transmission results reviewed.    Discharged from rehab on 1/20    Diet:  She lives in an assisted living  Eats foods that are prepared at the facility she lives in which are not low salt.     Optivol thoracic impedance suggesting ongoing possible fluid accumulation starting 1/3.   Prescribed:  Torsemide 20 mg take 2 tablets (40 mg total) by mouth daily (started 3/3 by PCP) Potassium 10 mEq take 1 tablet by mouth three (3) times a day (started 2/28 by PCP) Spironolactone 25 mg take 0.5 (12.5 mg total) tablet twice a day   Labs: 08/03/2023 Creatinine 0.75, BUN 19, Potassium 3.3, Sodium 140, GFR 77.24 (Potassium started 2/28) 06/30/2023 Creatinine 0.69, BUN 16, Potassium 3.4, Sodium 142, GFR 84.25   05/27/2023 Creatinine 0.82, BUN 23, Potassium 4.5, Sodium 134, GFR >60  05/26/2023 Creatinine 0.57, BUN 23, Potassium 4.8, Sodium 136, GFR >60  05/24/2023 Creatinine 0.65, BUN 37, Potassium 3.8, Sodium 130, GFR >60  05/23/2023 Creatinine 0.62, BUN 37, Potassium 3.8, Sodium 132, GFR >60 05/22/2023 Creatinine 0.57, BUN 36, Potassium 4.1, Sodium 129, GFR >60  05/21/2023 Creatinine 0.54, BUN 28, Potassium 3.9, Sodium 131, GFR >60  A complete set of results can be found in Results Review.   Recommendations:  Unable to reach.  Will send to Dr Gala Romney for review if patient is reached.   Follow-up plan: ICM clinic phone appointment on 08/24/2023 to recheck fluid  levels.  91 day device clinic remote transmission 09/29/2023.     EP/Cardiology Office Visits:  09/09/2023 with Dr Gala Romney.  Recall 05/13/2023 with Francis Dowse, PA or Otilio Saber, [place].     Copy of ICM check sent to Dr. Elberta Fortis.    3 month ICM trend: 08/16/2023.    12-14 Month ICM trend:     Karie Soda, RN 08/17/2023 7:35 AM

## 2023-08-17 NOTE — Telephone Encounter (Signed)
 Copied from CRM [id number]. Topic: Clinical - Medication Refill >> Aug 17, 2023  3:56 PM Alcus Dad wrote: Most Recent Primary Care Visit:  Provider: [facility]  Department: [facility]  Visit Type: LAB  Date: 08/10/2023  Medication: Penicillin  Has the patient contacted their pharmacy? Yes (Agent: If no, request that the patient contact the pharmacy for the refill. If patient does not wish to contact the pharmacy document the reason why and proceed with request.) (Agent: If yes, when and what did the pharmacy advise?)  Is this the correct pharmacy for this prescription? Yes If no, delete pharmacy and type the correct one.  This is the patient's preferred pharmacy:  [id number] [place], [place] - [address] AT [facility] [address] [facility] [address] [facility] [address] [place] [address] Phone: [phone] Fax: [phone]  [facility] [address] [place] [address] Phone: [phone] Fax: [phone]  [facility] - [place], [place] - [address] [facility] [place] [employer]. [address] [place] [address] Phone: [phone] Fax: [phone]  [facility] [address] [place] [address] Phone: [phone] Fax: [phone]   Has the prescription been filled recently? No  Is the patient out of the medication? Yes  Has the patient been seen for an appointment in the last year OR does the patient have an upcoming appointment? Yes  Can we respond through MyChart? Yes  Agent: Please be advised that Rx refills may take up to 3 business days. We ask that you follow-up with your pharmacy.

## 2023-08-17 NOTE — Telephone Encounter (Signed)
 Copied from CRM [id number]. Topic: Clinical - Medical Advice >> Aug 17, 2023  2:19 PM [name] wrote: Reason for CRM: [name] w/ inhabit [facility], called to inform Dr. Yetta Barre that the patient has been complaining with pain in her hips and shoulders. Today the pain is ranging between a 7-8/10. She states that she believes its from the medication, bumetanide 2mg . She started taking the Torsemide today. She would like clarification if she should she stop the bumetanide or take both of the medications.   Additionally, she stated that It doesn't appear to have been effective because she has gained 6lbs since last Thursday. [name]'s contact information is [phone]. Please call and advise.

## 2023-08-17 NOTE — Telephone Encounter (Signed)
 Patient called  stating that she needed antibiotics for wound infection, patient was triaged. See nurse triage encounter.

## 2023-08-18 ENCOUNTER — Telehealth: Payer: Self-pay

## 2023-08-18 ENCOUNTER — Other Ambulatory Visit: Payer: Self-pay | Admitting: Internal Medicine

## 2023-08-18 DIAGNOSIS — L089 Local infection of the skin and subcutaneous tissue, unspecified: Secondary | ICD-10-CM

## 2023-08-18 DIAGNOSIS — K9400 Colostomy complication, unspecified: Secondary | ICD-10-CM | POA: Insufficient documentation

## 2023-08-18 NOTE — Telephone Encounter (Unsigned)
 Copied from CRM [id number]. Topic: Clinical - Home Health Verbal Orders >> Aug 17, 2023  5:27 PM Eunice Blase wrote: Caller/Agency: [facility] per Juanita Laster Callback Number: [phone] Service Requested: Report Pain of 8 for pt.  Frequency:  Any new concerns about the patient? Yes

## 2023-08-18 NOTE — Telephone Encounter (Signed)
 Is this okay?

## 2023-08-18 NOTE — Telephone Encounter (Signed)
 Fyi.

## 2023-08-18 NOTE — Telephone Encounter (Signed)
**Note De-identified  [place] Obfuscation** Please advise 

## 2023-08-18 NOTE — Progress Notes (Signed)
 Pt left message stating returning call.  She continues to have leg swelling.  Bumex was stopped and started taking Torsemide 20 mg 2 tablets daily on 3/12.  ICM remote transmission to recheck fluid levels after starting Torsemide 3/12.

## 2023-08-18 NOTE — Telephone Encounter (Signed)
 Copied from CRM [id number]. Topic: Clinical - Medication Question >> Aug 18, 2023  8:56 AM Gurney Maxin H wrote: Reason for CRM: Mallory calling to verify that provider is okay with patient taking the potassium chloride (KLOR-CON) 10 MEQ tablet and the (ALDACTONE) 25 MG tablet together, states sometime both medications together can cause elevated potassium levels and wanted to just verify.  Mallory [phone]

## 2023-08-19 ENCOUNTER — Other Ambulatory Visit: Payer: Self-pay

## 2023-08-19 DIAGNOSIS — J449 Chronic obstructive pulmonary disease, unspecified: Secondary | ICD-10-CM | POA: Diagnosis not present

## 2023-08-19 DIAGNOSIS — R112 Nausea with vomiting, unspecified: Secondary | ICD-10-CM | POA: Diagnosis present

## 2023-08-19 DIAGNOSIS — J45909 Unspecified asthma, uncomplicated: Secondary | ICD-10-CM | POA: Diagnosis not present

## 2023-08-19 DIAGNOSIS — Z7901 Long term (current) use of anticoagulants: Secondary | ICD-10-CM | POA: Insufficient documentation

## 2023-08-19 DIAGNOSIS — R197 Diarrhea, unspecified: Secondary | ICD-10-CM | POA: Diagnosis not present

## 2023-08-19 DIAGNOSIS — I10 Essential (primary) hypertension: Secondary | ICD-10-CM | POA: Insufficient documentation

## 2023-08-19 DIAGNOSIS — Z950 Presence of cardiac pacemaker: Secondary | ICD-10-CM | POA: Insufficient documentation

## 2023-08-19 DIAGNOSIS — Z794 Long term (current) use of insulin: Secondary | ICD-10-CM | POA: Insufficient documentation

## 2023-08-19 DIAGNOSIS — I4891 Unspecified atrial fibrillation: Secondary | ICD-10-CM | POA: Insufficient documentation

## 2023-08-19 LAB — CBC
HCT: 37.9 % (ref 36.0–46.0)
Hemoglobin: 12 g/dL (ref 12.0–15.0)
MCH: 25.4 pg — ABNORMAL LOW (ref 26.0–34.0)
MCHC: 31.7 g/dL (ref 30.0–36.0)
MCV: 80.1 fL (ref 80.0–100.0)
Platelets: 215 10*3/uL (ref 150–400)
RBC: 4.73 MIL/uL (ref 3.87–5.11)
RDW: 16.3 % — ABNORMAL HIGH (ref 11.5–15.5)
WBC: 7.2 10*3/uL (ref 4.0–10.5)
nRBC: 0 % (ref 0.0–0.2)

## 2023-08-19 LAB — URINALYSIS, ROUTINE W REFLEX MICROSCOPIC
Bacteria, UA: NONE SEEN
Bilirubin Urine: NEGATIVE
Glucose, UA: NEGATIVE mg/dL
Hgb urine dipstick: NEGATIVE
Ketones, ur: NEGATIVE mg/dL
Nitrite: NEGATIVE
Protein, ur: NEGATIVE mg/dL
Specific Gravity, Urine: 1.015 (ref 1.005–1.030)
pH: 5.5 (ref 5.0–8.0)

## 2023-08-19 LAB — COMPREHENSIVE METABOLIC PANEL
ALT: 10 U/L (ref 0–44)
AST: 14 U/L — ABNORMAL LOW (ref 15–41)
Albumin: 4.4 g/dL (ref 3.5–5.0)
Alkaline Phosphatase: 59 U/L (ref 38–126)
Anion gap: 13 (ref 5–15)
BUN: 23 mg/dL (ref 8–23)
CO2: 26 mmol/L (ref 22–32)
Calcium: 8 mg/dL — ABNORMAL LOW (ref 8.9–10.3)
Chloride: 93 mmol/L — ABNORMAL LOW (ref 98–111)
Creatinine, Ser: 0.78 mg/dL (ref 0.44–1.00)
GFR, Estimated: >60 mL/min (ref >60)
Glucose, Bld: 131 mg/dL — ABNORMAL HIGH (ref 70–99)
Potassium: 3.5 mmol/L (ref 3.5–5.1)
Sodium: 132 mmol/L — ABNORMAL LOW (ref 135–145)
Total Bilirubin: 1.8 mg/dL — ABNORMAL HIGH (ref 0.0–1.2)
Total Protein: 6.8 g/dL (ref 6.5–8.1)

## 2023-08-19 LAB — LIPASE, BLOOD: Lipase: 13 U/L (ref 11–51)

## 2023-08-19 MED ORDER — ONDANSETRON 4 MG PO TBDP
4.0000 mg | ORAL_TABLET | Freq: Once | ORAL | Status: AC | PRN
  Administered 2023-08-19: 4 mg via ORAL
  Filled 2023-08-19: qty 1

## 2023-08-19 NOTE — ED Notes (Signed)
 Helped pt back to bathroom gave OB wipes, placed hat to collect urine

## 2023-08-19 NOTE — ED Triage Notes (Signed)
 Pt POV from home reporting multiple episodes of emesis since last night and increased ostomy output today. Endorses chills and abd pain around ostomy site.

## 2023-08-20 ENCOUNTER — Emergency Department (HOSPITAL_BASED_OUTPATIENT_CLINIC_OR_DEPARTMENT_OTHER)
Admission: EM | Admit: 2023-08-20 | Discharge: 2023-08-20 | Disposition: A | Discharge status: Home / Self Care | Attending: Emergency Medicine | Admitting: Emergency Medicine

## 2023-08-20 ENCOUNTER — Encounter (HOSPITAL_BASED_OUTPATIENT_CLINIC_OR_DEPARTMENT_OTHER): Payer: Self-pay

## 2023-08-20 ENCOUNTER — Emergency Department (HOSPITAL_BASED_OUTPATIENT_CLINIC_OR_DEPARTMENT_OTHER)

## 2023-08-20 DIAGNOSIS — R112 Nausea with vomiting, unspecified: Secondary | ICD-10-CM | POA: Diagnosis not present

## 2023-08-20 MED ORDER — SODIUM CHLORIDE 0.9 % IV BOLUS
500.0000 mL | Freq: Once | INTRAVENOUS | Status: AC
  Administered 2023-08-20: 500 mL via INTRAVENOUS

## 2023-08-20 MED ORDER — IOHEXOL 300 MG/ML  SOLN
100.0000 mL | Freq: Once | INTRAMUSCULAR | Status: AC | PRN
  Administered 2023-08-20: 100 mL via INTRAVENOUS

## 2023-08-20 NOTE — ED Notes (Signed)
 Pt reassessed in triage.   New supplemental oxygen tank provided.

## 2023-08-20 NOTE — ED Provider Notes (Signed)
 [place] [facility] AT [facility] Provider Note   CSN: [id number] Arrival date & time: 08/19/23  2010     History  Chief Complaint  Patient presents with   Emesis    Kathleen [name] is a 78 y.o. female.  The history is provided by the patient.  Emesis Severity:  Moderate Timing:  Intermittent Quality:  Stomach contents Progression:  Unchanged Chronicity:  New Recent urination:  Normal Context: not post-tussive   Relieved by:  Nothing Worsened by:  Nothing Ineffective treatments:  None tried Associated symptoms: diarrhea   Associated symptoms: no abdominal pain, no fever and no URI   Risk factors: no prior abdominal surgery   Patient with ostomy presents with nausea, vomiting and diarrhea.      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 Authorizing Provider  acetaminophen (TYLENOL) 500 MG tablet Take 1,000 mg by mouth as needed.    [provider]  albuterol (PROVENTIL) (2.5 MG/3ML) 0.083% nebulizer solution USE 3 ML VIA NEBULIZER THREE TIMES DAILY 06/28/23   [provider]  bumetanide (BUMEX) 2 MG tablet Take 1 tablet (2 mg total) by mouth daily. 07/26/23   [name], MD  Cholecalciferol (VITAMIN D3) 50 MCG (2000 UT) TABS Take 2,000 Units by mouth at bedtime.    [provider]  cyanocobalamin (VITAMIN B12) 1000 MCG/ML injection Inject 1 mL  (1,000 mcg total) into the muscle every 30 (thirty) days. 05/05/22   [name], MD  docusate sodium (COLACE) 100 MG capsule Take 100 mg by mouth daily.    [provider]  dofetilide (TIKOSYN) 250 MCG capsule Take 1 capsule (250 mcg total) by mouth in the morning and at bedtime. 07/23/23 07/22/24  Laurey Morale, MD  ELIQUIS 5 MG TABS tablet Take 1 tablet (5 mg total) by mouth 2 (two) times daily. 07/26/23   [name], MD  febuxostat (ULORIC) 40 MG tablet Take 1 tablet (40 mg total) by mouth daily. 07/23/23   Fuller Plan, MD  fluticasone furoate-vilanterol (BREO ELLIPTA) 100-25 MCG/ACT AEPB INHALE 1 PUFF INTO THE LUNGS DAILY 03/23/23   Oretha Milch, MD  gabapentin (NEURONTIN) 100 MG capsule Take 2 capsules (200 mg total) by mouth at bedtime. 07/26/23 02/21/24  Windell Norfolk, MD  insulin aspart (NOVOLOG) 100 UNIT/ML FlexPen Before each meal 3 times a day, 140-199 - 2 units, 200-250 - 4 units, 251-299 - 6 units,  300-349 - 8 units,  350 or above 10 units. 05/27/23   Leroy Sea, MD  LANTUS SOLOSTAR 100 UNIT/ML Solostar Pen SMARTSIG:12 Unit(s) SUB-Q Every Morning 06/27/23   [provider]  levalbuterol Pauline Aus) 0.63 MG/3ML nebulizer solution Take 3 mLs (0.63 mg total) by nebulization every 6 (six) hours as needed for wheezing or shortness of breath. DX J44.89 J96.21 01/20/23  Oretha Milch, MD  levothyroxine (SYNTHROID) 25 MCG tablet Take 1 tablet (25 mcg total) by mouth daily. 07/26/23   Pincus Sanes, MD  Magnesium 500 MG TABS Take 500 mg by mouth 2 (two) times daily.    [provider]  methocarbamol (ROBAXIN) 500 MG tablet Take 500 mg by mouth 3 (three) times daily as needed. 06/28/23   [provider]  metoCLOPramide (REGLAN) 10 MG tablet Take by mouth. 06/27/23   [provider]  metroNIDAZOLE (FLAGYL) 500 MG tablet SMARTSIG:1 Tablet(s) By Mouth Every 12 Hours 06/28/23   [provider]  mirtazapine (REMERON) 15 MG tablet  Take 1 tablet (15 mg total) by mouth at bedtime. 07/26/23   [name], MD  Nutritional Supplements (,FEEDING SUPPLEMENT, PROSOURCE PLUS) liquid Take 30 mLs by mouth 2 (two) times daily between meals. 05/27/23   Leroy Sea, MD  nystatin (MYCOSTATIN/NYSTOP) powder Apply topically 3 (three) times daily. 05/27/23   Leroy Sea, MD  ondansetron (ZOFRAN) 4 MG tablet Take 1 tablet (4 mg total) by mouth every 8 (eight) hours as needed for nausea or vomiting. 05/27/23   Leroy Sea, MD  oxyCODONE (OXY IR/ROXICODONE) 5 MG immediate release tablet Take 1-2 tablets (5-10 mg total) by mouth every 6 (six) hours as needed for moderate pain (pain score 4-6) or severe pain (pain score 7-10) (5mg  moderate, 10mg  severe). 05/27/23   Leroy Sea, MD  OXYGEN Inhale 4 L into the lungs continuous. Use with resmed ventilator    [provider]  pantoprazole (PROTONIX) 40 MG tablet Take 1 tablet (40 mg total) by mouth daily. 04/05/23   Almon Hercules, MD  potassium chloride (KLOR-CON) 10 MEQ tablet Take 1 tablet (10 mEq total) by mouth 3 (three) times daily. 08/06/23   [name], MD  revefenacin (YUPELRI) 175 MCG/3ML nebulizer solution Take 175 mcg by nebulization daily.    [provider]  rivaroxaban (XARELTO) 20 MG TABS tablet Take 1 tablet (20 mg total) by mouth daily with supper. 02/01/23   Bensimhon, [name], MD  spironolactone (ALDACTONE) 25 MG tablet Take 0.5 tablets (12.5 mg total) by mouth 2 (two) times daily. 08/02/23   Bensimhon, [name], MD  torsemide (DEMADEX) 20 MG tablet Take 2 tablets (40 mg total) by mouth daily. 08/09/23   [name], MD  triamcinolone cream (KENALOG) 0.1 % Apply 1 Application topically 2 (two) times daily. 07/02/23   Alfredia Ferguson, PA-C  Zinc Oxide (DESITIN MAXIMUM STRENGTH) 40 % PSTE Apply 1 Application topically 4 (four) times daily. Mix with aquaphor and apply to inflamed area    [provider]      Allergies     Allopurinol, Clindamycin, Flublok [influenza vaccine recombinant], Pneumococcal 13-val conj vacc, Dronedarone, Montelukast, Brovana [arformoterol], Budesonide, Entresto [sacubitril-valsartan], Fosamax [alendronate sodium], Jardiance [empagliflozin], Meperidine, Microplegia msa-msg [plegisol], Rosuvastatin, Tetracycline, Adhesive [tape], and Lovastatin    Review of Systems   Review of Systems  Constitutional:  Negative for fever.  Gastrointestinal:  Positive for diarrhea and vomiting. Negative for abdominal pain.  All other systems reviewed and are negative.   Physical Exam Updated Vital Signs BP (!) 110/55   Pulse 70   Temp 98 F (36.7 C)   Resp 18   Ht 5' (1.524 m)   Wt 78.5 kg   SpO2 99%   BMI 33.79 kg/m  Physical Exam Vitals and nursing note reviewed. Exam conducted with a chaperone present.  Constitutional:  General: She is not in acute distress.    Appearance: She is well-developed.  HENT:     Head: Normocephalic and atraumatic.     Nose: Nose normal.  Eyes:     Pupils: Pupils are equal, round, and reactive to light.  Cardiovascular:     Rate and Rhythm: Normal rate and regular rhythm.     Pulses: Normal pulses.     Heart sounds: Normal heart sounds.  Pulmonary:     Effort: Pulmonary effort is normal. No respiratory distress.     Breath sounds: Normal breath sounds.  Abdominal:     General: Bowel sounds are normal. There is no distension.     Palpations: Abdomen is soft.     Tenderness: There is no abdominal tenderness. There is no guarding or rebound.     Comments: Wound without infection healing by secondary infection, tape dermatitis at edges   Musculoskeletal:        General: Normal range of motion.     Cervical back: Neck supple.  Skin:    General: Skin is dry.     Capillary Refill: Capillary refill takes less than 2 seconds.     Findings: No erythema or rash.  Neurological:     General: No focal deficit present.     Deep Tendon Reflexes: Reflexes  normal.  Psychiatric:        Mood and Affect: Mood normal.     ED Results / Procedures / Treatments   Labs (all labs ordered are listed, but only abnormal results are displayed) Results for orders placed or performed during the hospital encounter of 08/20/23  Lipase, blood   Collection Time: 08/19/23  8:25 PM  Result Value Ref Range   Lipase 13 11 - 51 U/L  Comprehensive metabolic panel   Collection Time: 08/19/23  8:25 PM  Result Value Ref Range   Sodium 132 (L) 135 - 145 mmol/L   Potassium 3.5 3.5 - 5.1 mmol/L   Chloride 93 (L) 98 - 111 mmol/L   CO2 26 22 - 32 mmol/L   Glucose, Bld 131 (H) 70 - 99 mg/dL   BUN 23 8 - 23 mg/dL   Creatinine, Ser 4.09 0.44 - 1.00 mg/dL   Calcium 8.0 (L) 8.9 - 10.3 mg/dL   Total Protein 6.8 6.5 - 8.1 g/dL   Albumin 4.4 3.5 - 5.0 g/dL   AST 14 (L) 15 - 41 U/L   ALT 10 0 - 44 U/L   Alkaline Phosphatase 59 38 - 126 U/L   Total Bilirubin 1.8 (H) 0.0 - 1.2 mg/dL   GFR, Estimated >81 >19 mL/min   Anion gap 13 5 - 15  CBC   Collection Time: 08/19/23  8:25 PM  Result Value Ref Range   WBC 7.2 4.0 - 10.5 K/uL   RBC 4.73 3.87 - 5.11 MIL/uL   Hemoglobin 12.0 12.0 - 15.0 g/dL   HCT 14.7 82.9 - 56.2 %   MCV 80.1 80.0 - 100.0 fL   MCH 25.4 (L) 26.0 - 34.0 pg   MCHC 31.7 30.0 - 36.0 g/dL   RDW 13.0 (H) 86.5 - 78.4 %   Platelets 215 150 - 400 K/uL   nRBC 0.0 0.0 - 0.2 %  Urinalysis, Routine w reflex microscopic -Urine, Clean Catch   Collection Time: 08/19/23 10:50 PM  Result Value Ref Range   Color, Urine ORANGE (A) YELLOW   APPearance CLEAR CLEAR   Specific Gravity, Urine 1.015 1.005 - 1.030   pH  5.5 5.0 - 8.0   Glucose, UA NEGATIVE NEGATIVE mg/dL   Hgb urine dipstick NEGATIVE NEGATIVE   Bilirubin Urine NEGATIVE NEGATIVE   Ketones, ur NEGATIVE NEGATIVE mg/dL   Protein, ur NEGATIVE NEGATIVE mg/dL   Nitrite NEGATIVE NEGATIVE   Leukocytes,Ua MODERATE (A) NEGATIVE   RBC / HPF 0-5 0 - 5 RBC/hpf   WBC, UA 11-20 0 - 5 WBC/hpf   Bacteria, UA NONE  SEEN NONE SEEN   Squamous Epithelial / HPF 0-5 0 - 5 /HPF   Mucus PRESENT    CT ABDOMEN PELVIS W CONTRAST Result Date: 08/20/2023 CLINICAL DATA:  Blunt polytrauma, vomiting, peristomal abdominal pain EXAM: CT ABDOMEN AND PELVIS WITH CONTRAST TECHNIQUE: Multidetector CT imaging of the abdomen and pelvis was performed using the standard protocol following bolus administration of intravenous contrast. RADIATION DOSE REDUCTION: This exam was performed according to the departmental dose-optimization program which includes automated exposure control, adjustment of the mA and/or kV according to patient size and/or use of iterative reconstruction technique. CONTRAST:  OMNIPAQUE IOHEXOL 300 MG/ML  SOLN COMPARISON:  None Available. FINDINGS: Lower chest: No acute abnormality. Hepatobiliary: Scattered cysts again noted throughout the liver. No enhancing intrahepatic mass. Status post cholecystectomy. No intra or extrahepatic biliary ductal dilation. Pancreas: Unremarkable Spleen: Unremarkable Adrenals/Urinary Tract: Adrenal glands are unremarkable. Simple cortical cyst are seen within the kidneys bilaterally for which no follow-up imaging is recommended. The kidneys are otherwise unremarkable. Bladder unremarkable. Stomach/Bowel: Surgical changes of left lower quadrant descending colostomy and Hartmann pouch formation are identified. Mild diverticulosis of the residual rectosigmoid colon. The cecum and ileocecal junction appear displaced into the periumbilical region, best seen on image # 49/2, however, this appears similar to prior examination. The colon appears diffusely fluid-filled suggesting changes of a diarrheal state. The stomach, small bowel, and large bowel are otherwise and there is no evidence of obstruction. No free intraperitoneal gas or fluid. Vascular/Lymphatic: Aortic atherosclerosis. No enlarged abdominal or pelvic lymph nodes. Reproductive: Status post hysterectomy. No adnexal masses. Other: No  abdominal wall hernia Musculoskeletal: No acute or significant osseous findings. IMPRESSION: 1. Diffusely fluid-filled colon suggesting changes of a diarrheal state. 2. Surgical changes of left lower quadrant descending colostomy and pouch formation. 3. Mild diverticulosis of the residual rectosigmoid colon. Aortic Atherosclerosis (ICD10-I70.0). Electronically Signed   By: Helyn Numbers M.D.   On: 08/20/2023 03:20   CT ABDOMEN PELVIS W CONTRAST Result Date: 08/05/2023 CLINICAL DATA:  Left lower quadrant rebound tenderness EXAM: CT ABDOMEN AND PELVIS WITH CONTRAST TECHNIQUE: Multidetector CT imaging of the abdomen and pelvis was performed using the standard protocol following bolus administration of intravenous contrast. RADIATION DOSE REDUCTION: This exam was performed according to the departmental dose-optimization program which includes automated exposure control, adjustment of the mA and/or kV according to patient size and/or use of iterative reconstruction technique. CONTRAST:  ISOVUE-300 IOPAMIDOL (ISOVUE-300) INJECTION 61% COMPARISON:  05/23/2023 FINDINGS: Lower chest: No acute pleural or parenchymal lung disease. Hepatobiliary: Scattered hypodensities throughout the liver consistent with multiple cysts. No other focal liver abnormalities. No biliary duct dilation. Prior cholecystectomy. Pancreas: Unremarkable. No pancreatic ductal dilatation or surrounding inflammatory changes. Spleen: Normal in size without focal abnormality. Adrenals/Urinary Tract: Stable appearance of the kidneys. No urinary tract calculi or obstructive uropathy. The adrenals are unremarkable. The bladder is decompressed, limiting its evaluation. There does appear to be some asymmetric wall thickening along the left posterolateral aspect of the bladder, measuring up to 7 mm. Please correlate with urinalysis for evidence of cystitis. Stomach/Bowel:  Postsurgical changes from distal colectomy, with diverting colostomy and Hartman  pouch. No evidence of bowel obstruction or ileus. Mobile cecum located in the left mid abdomen. No bowel wall thickening or inflammatory change. Retained dense contrast material within the [employer]. Vascular/Lymphatic: Aortic atherosclerosis. No enlarged abdominal or pelvic lymph nodes. Reproductive: Status post hysterectomy. No adnexal masses. Other: No free fluid or free intraperitoneal gas. The fluid collection within the lower central abdomen on prior study has resolved in the interim. No new fluid collections or evidence of abscess. No abdominal wall hernia. Musculoskeletal: No acute or destructive bony abnormalities. Reconstructed images demonstrate no additional findings. IMPRESSION: 1. Stable postsurgical changes from partial colectomy, left lower quadrant diverting colostomy, and Hartmann pouch. No evidence of postoperative complication. The fluid collection within the lower central abdomen on prior study has resolved in the interim. 2. Nonspecific bladder wall thickening given decompressed state. Please correlate with urinalysis for any evidence of cystitis. 3. No bowel obstruction or ileus. 4.  Aortic Atherosclerosis (ICD10-I70.0). Electronically Signed   By: Sharlet Salina M.D.   On: 08/05/2023 17:32      Radiology CT ABDOMEN PELVIS W CONTRAST Result Date: 08/20/2023 CLINICAL DATA:  Blunt polytrauma, vomiting, peristomal abdominal pain EXAM: CT ABDOMEN AND PELVIS WITH CONTRAST TECHNIQUE: Multidetector CT imaging of the abdomen and pelvis was performed using the standard protocol following bolus administration of intravenous contrast. RADIATION DOSE REDUCTION: This exam was performed according to the departmental dose-optimization program which includes automated exposure control, adjustment of the mA and/or kV according to patient size and/or use of iterative reconstruction technique. CONTRAST:  OMNIPAQUE IOHEXOL 300 MG/ML  SOLN COMPARISON:  None Available. FINDINGS: Lower chest: No  acute abnormality. Hepatobiliary: Scattered cysts again noted throughout the liver. No enhancing intrahepatic mass. Status post cholecystectomy. No intra or extrahepatic biliary ductal dilation. Pancreas: Unremarkable Spleen: Unremarkable Adrenals/Urinary Tract: Adrenal glands are unremarkable. Simple cortical cyst are seen within the kidneys bilaterally for which no follow-up imaging is recommended. The kidneys are otherwise unremarkable. Bladder unremarkable. Stomach/Bowel: Surgical changes of left lower quadrant descending colostomy and Hartmann pouch formation are identified. Mild diverticulosis of the residual rectosigmoid colon. The cecum and ileocecal junction appear displaced into the periumbilical region, best seen on image # 49/2, however, this appears similar to prior examination. The colon appears diffusely fluid-filled suggesting changes of a diarrheal state. The stomach, small bowel, and large bowel are otherwise and there is no evidence of obstruction. No free intraperitoneal gas or fluid. Vascular/Lymphatic: Aortic atherosclerosis. No enlarged abdominal or pelvic lymph nodes. Reproductive: Status post hysterectomy. No adnexal masses. Other: No abdominal wall hernia Musculoskeletal: No acute or significant osseous findings. IMPRESSION: 1. Diffusely fluid-filled colon suggesting changes of a diarrheal state. 2. Surgical changes of left lower quadrant descending colostomy and pouch formation. 3. Mild diverticulosis of the residual rectosigmoid colon. Aortic Atherosclerosis (ICD10-I70.0). Electronically Signed   By: Helyn Numbers M.D.   On: 08/20/2023 03:20    Procedures Procedures    Medications Ordered in ED Medications  ondansetron (ZOFRAN-ODT) disintegrating tablet 4 mg (4 mg Oral Given 08/19/23 2028)  iohexol (OMNIPAQUE) 300 MG/ML solution 100 mL (100 mLs Intravenous Contrast Given 08/20/23 0206)  sodium chloride 0.9 % bolus 500 mL (0 mLs Intravenous Stopped 08/20/23 0404)    ED Course/  Medical Decision Making/ A&P  Medical Decision Making Patient with nausea vomiting and diarrhea   Amount and/or Complexity of Data Reviewed External Data Reviewed: notes.    Details: Previous notes reviewed  Labs: ordered.    Details: Normal lipase 13, sodium slight low 132, normal potassium 3.6, normal creatinine 0.78.  Normal white count 7.2, normal hemoglobin 12, normal platelets  Radiology: ordered and independent interpretation performed.    Details: Diarrhea   Risk Prescription drug management. Risk Details: Well appearing, feeling better post medication.  Symptoms are consistent with viral nausea vomiting and diarrhea.  Stable for discharge     Final Clinical Impression(s) / ED Diagnoses Final diagnoses:  Nausea vomiting and diarrhea    No signs of systemic illness or infection. The patient is nontoxic-appearing on exam and vital signs are within normal limits.  I have reviewed the triage vital signs and the nursing notes. Pertinent labs & imaging results that were available during my care of the patient were reviewed by me and considered in my medical decision making (see chart for details). After history, exam, and medical workup I feel the patient has been appropriately medically screened and is safe for discharge home. Pertinent diagnoses were discussed with the patient. Patient was given return precautions.  Rx / DC Orders ED Discharge Orders     None         Joseth Weigel, MD 08/20/23 [phone]

## 2023-08-24 ENCOUNTER — Ambulatory Visit: Attending: Cardiology

## 2023-08-24 ENCOUNTER — Telehealth: Payer: Self-pay

## 2023-08-24 DIAGNOSIS — Z950 Presence of cardiac pacemaker: Secondary | ICD-10-CM

## 2023-08-24 DIAGNOSIS — I5022 Chronic systolic (congestive) heart failure: Secondary | ICD-10-CM

## 2023-08-24 NOTE — Progress Notes (Signed)
 EPIC Encounter for ICM Monitoring  Patient Name: Kathleen [name] is a 78 y.o. female Date: 08/24/2023 Primary Care Physican: [name], MD Primary Cardiologist: Bensimhon Electrophysiologist: Elberta Fortis Bi-V Pacing: 100%          03/16/2023 Weight: 190 lbs       03/23/2023 Weight: 188 lbs   04/06/2023 Weight: 176 lbs    06/30/2023 Weight: 178 lbs 08/02/2023 Weight: 172 lbs   Clinical Status (16-Aug-2023 to 24-Aug-2023) Time in AT/AF  0.0 hr/day (0.0%)                                 Attempted call to patient and unable to reach.  Left detailed message per DPR regarding transmission.  Transmission results reviewed.    Discharged from rehab on 06/28/2023.  ED visit 3/14 for Nausea, vomiting and diarrhea.   Diet:  She lives in an assisted living  Eats foods that are prepared at the facility she lives in which are not low salt.     Optivol thoracic impedance suggesting ongoing possible fluid accumulation starting 1/3 but is trending back closer to baseline since Torsemide was started on 3/12.  Fluid index greater than normal threshold starting 06/21/2023.   Prescribed:  Torsemide 20 mg take 2 tablets (40 mg total) by mouth daily (started 3/12) Potassium 10 mEq take 1 tablet by mouth three (3) times a day (started 2/28 by PCP) Spironolactone 25 mg take 0.5 (12.5 mg total) tablet twice a day   Labs: 08/03/2023 Creatinine 0.75, BUN 19, Potassium 3.3, Sodium 140, GFR 77.24 (Potassium started 2/28) 06/30/2023 Creatinine 0.69, BUN 16, Potassium 3.4, Sodium 142, GFR 84.25   05/27/2023 Creatinine 0.82, BUN 23, Potassium 4.5, Sodium 134, GFR >60  05/26/2023 Creatinine 0.57, BUN 23, Potassium 4.8, Sodium 136, GFR >60  05/24/2023 Creatinine 0.65, BUN 37, Potassium 3.8, Sodium 130, GFR >60  05/23/2023 Creatinine 0.62, BUN 37, Potassium 3.8, Sodium 132, GFR >60 05/22/2023 Creatinine 0.57, BUN 36, Potassium 4.1, Sodium 129, GFR >60  05/21/2023 Creatinine 0.54, BUN 28, Potassium 3.9, Sodium 131, GFR  >60  A complete set of results can be found in Results Review.   Recommendations:  Left voice mail with ICM number and encouraged to call if experiencing any fluid symptoms.  Will send to Dr Gala Romney for review if patient is reached.   Follow-up plan: ICM clinic phone appointment on 08/31/2023 to recheck fluid levels.  91 day device clinic remote transmission 09/29/2023.     EP/Cardiology Office Visits:  09/09/2023 with Dr Gala Romney.  Recall 05/13/2023 with Francis Dowse, PA or Otilio Saber, [place].     Copy of ICM check sent to Dr. Elberta Fortis.    3 month ICM trend: 08/24/2023.    12-14 Month ICM trend:     Karie Soda, RN 08/24/2023 7:50 AM

## 2023-08-24 NOTE — Telephone Encounter (Signed)
 Remote ICM transmission received.  Attempted call to patient regarding ICM remote transmission and left detailed message per DPR.  Left ICM phone number and advised to return call for any fluid symptoms or questions. Next ICM remote transmission scheduled 08/31/2023.

## 2023-08-25 ENCOUNTER — Other Ambulatory Visit: Payer: Self-pay | Admitting: Internal Medicine

## 2023-08-27 ENCOUNTER — Ambulatory Visit (HOSPITAL_COMMUNITY): Admitting: Nurse Practitioner

## 2023-08-27 ENCOUNTER — Telehealth: Payer: Self-pay | Admitting: Internal Medicine

## 2023-08-27 NOTE — Telephone Encounter (Signed)
 Copied from CRM [id number]. Topic: Clinical - Home Health Verbal Orders >> Aug 27, 2023  3:03 PM Fredrich Romans wrote: Caller/Agency: [facility] Callback Number: [phone] Service Requested: Physical Therapy Frequency: 2wk4 Any new concerns about the patient? No

## 2023-08-30 NOTE — Telephone Encounter (Signed)
 Can I give the verbals ?

## 2023-08-30 NOTE — Telephone Encounter (Signed)
Verbals given  

## 2023-08-31 ENCOUNTER — Encounter

## 2023-08-31 ENCOUNTER — Telehealth: Payer: Self-pay | Admitting: Internal Medicine

## 2023-08-31 NOTE — Telephone Encounter (Signed)
 Copied from CRM [id number]. Topic: General - Other >> Aug 31, 2023  1:51 PM Florestine Avers wrote: Reason for CRM: Kristi a physical therapist with [facility] calling in for a weight check perimeter for the patient. Silva Bandy states that the patient weighed 175 today and that was outside of the perimeters of 164-174. Kristi best call back number is [phone].

## 2023-08-31 NOTE — Telephone Encounter (Signed)
 Kathleen [name] wants to know is there anything you want them to do to get the weight off of the patient on the 175 is still fine.

## 2023-08-31 NOTE — Progress Notes (Signed)
 ICM remote transmission rescheduled for 09/14/2023 after patient will be seen by HF clinic 4/3.

## 2023-09-01 ENCOUNTER — Telehealth: Payer: Self-pay

## 2023-09-01 NOTE — Telephone Encounter (Signed)
 Returned patient call as requested by voice mail message.  She is taking Torsemide 2 tablets (prescribed by Dr Yetta Barre, PCP) with poor urine output.  She continues to have swelling in feet/legs and weight has increased several pounds over night.  She has an appt with Dr Gala Romney.   Advised to call Dr Bensimhon's office today to notify of changes in condition or use the ER if needed.  She agreed to call today.

## 2023-09-01 NOTE — Telephone Encounter (Signed)
 Duplicate, opened in erro

## 2023-09-02 ENCOUNTER — Telehealth (HOSPITAL_COMMUNITY): Payer: Self-pay | Admitting: Cardiology

## 2023-09-02 NOTE — Telephone Encounter (Signed)
 Patient left VM on triage line with concerns of fluid overload and not feeling well.   Returned call Pt reports she is taking 40 mg daily of torsemide, reports she is gaining weight up ~8lbs in one week (yesterday 177 today 174,normal 169) -device check fluid elevated -reports increase in SOB, with 4L of continuous O2 -reports swelling in feet (almost hard to put on shoes)    Currently scheduled for 4/3 would like to know if diuretics can be adjustments can be made before appt.

## 2023-09-03 ENCOUNTER — Ambulatory Visit (HOSPITAL_COMMUNITY)
Admission: RE | Admit: 2023-09-03 | Discharge: 2023-09-03 | Disposition: A | Discharge status: Home / Self Care | Source: Ambulatory Visit | Attending: Nurse Practitioner | Admitting: Nurse Practitioner

## 2023-09-03 ENCOUNTER — Encounter: Payer: Self-pay | Admitting: Internal Medicine

## 2023-09-03 DIAGNOSIS — T8189XD Other complications of procedures, not elsewhere classified, subsequent encounter: Secondary | ICD-10-CM | POA: Diagnosis not present

## 2023-09-03 DIAGNOSIS — L24B3 Irritant contact dermatitis related to fecal or urinary stoma or fistula: Secondary | ICD-10-CM

## 2023-09-03 DIAGNOSIS — Z933 Colostomy status: Secondary | ICD-10-CM | POA: Insufficient documentation

## 2023-09-03 DIAGNOSIS — K9400 Colostomy complication, unspecified: Secondary | ICD-10-CM | POA: Diagnosis not present

## 2023-09-03 NOTE — Progress Notes (Signed)
 [facility]   Reason for visit:  LLQ colostomy, follow up on new pouching system which is performing well.  HPI:  Diverticulitis with colostomy, midline surgical wound, delayed healing  Past Medical History:  Diagnosis Date  . Asthma   . 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HCC)   . CHF (congestive heart failure) (HCC)   . Chronic renal insufficiency   . 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 Concussion 10/04/2021  . COPD (chronic obstructive pulmonary disease) (HCC)   . Diverticulitis   . Gout   . Hypertension   . Longstanding persistent atrial fibrillation (HCC)    on Xarelto  . Nonischemic cardiomyopathy (HCC)   . Obesity   . Pacemaker   . Spontaneous pneumothorax 2013   Family History  Problem Relation Age of Onset  . Breast cancer Mother 59       3 different times  . Pancreatic cancer Mother   . Emphysema Father   . Healthy Brother   . Breast cancer Cousin   . Valvular heart disease Son   . Obesity Daughter   . Colon cancer Neg Hx   . Esophageal cancer Neg Hx   . Stomach cancer Neg Hx   . Inflammatory bowel disease Neg Hx   . Liver disease Neg Hx   . Rectal cancer Neg Hx    Allergies  Allergen Reactions  . Allopurinol Other (See Comments)    Reaction:  Dizziness   . Clindamycin Anaphylaxis and Hives  . Flublok [Influenza Vaccine Recombinant] Other (See Comments)    Fever 103 with no alternative explanation day after vaccine.  Woodley Petzold Highfill FNP-C  . Pneumococcal 13-Val Conj Vacc Itching, Swelling and Rash  . Dronedarone Rash  . Montelukast Other (See Comments)    Makes her loopy.  Rosalyn Gess [Arformoterol]     Caused muscle pain  . Budesonide     Caused extreme joint pain  . Entresto [Sacubitril-Valsartan] Other (See Comments)    hypotension  . Fosamax [Alendronate Sodium] Nausea Only  . Jardiance [Empagliflozin]     Caused a vaginal infection  . Meperidine Nausea And Vomiting  . Microplegia Msa-Msg  [Plegisol]     Loopy,diarrhea  . Rosuvastatin Other (See Comments)    Reaction:  Muscle spasms   . Tetracycline Hives  . Adhesive [Tape] Rash  . Lovastatin Rash and Other (See Comments)    Muscle Pain   Current Outpatient Medications  Medication Sig Dispense Refill Last Dose/Taking  . acetaminophen (TYLENOL) 500 MG tablet Take 1,000 mg by mouth as needed.     [name] albuterol (PROVENTIL) (2.5 MG/3ML) 0.083% nebulizer solution USE 3 ML VIA NEBULIZER THREE TIMES DAILY     . Cholecalciferol (VITAMIN D3) 50 MCG (2000 UT) TABS Take 2,000 Units by mouth at bedtime.     . docusate sodium (COLACE) 100 MG capsule Take 100 mg by mouth daily.     [name] dofetilide (TIKOSYN) 250 MCG capsule Take 1 capsule (250 mcg total) by mouth in the morning and at bedtime. 60 capsule 6   . ELIQUIS 5 MG TABS tablet TAKE 1 TABLET(5 MG) BY MOUTH TWICE DAILY 60 tablet 0   . febuxostat (ULORIC) 40 MG tablet Take 1 tablet (40 mg total) by mouth daily. 90 tablet 0   . fluticasone furoate-vilanterol (BREO ELLIPTA) 100-25 MCG/ACT AEPB INHALE 1 PUFF INTO THE LUNGS DAILY 60 each 11   . gabapentin (NEURONTIN) 100 MG capsule Take 2 capsules (200 mg  total) by mouth at bedtime. 60 capsule 6   . levalbuterol (XOPENEX) 0.63 MG/3ML nebulizer solution Take 3 mLs (0.63 mg total) by nebulization every 6 (six) hours as needed for wheezing or shortness of breath. DX J44.89 J96.21 3 mL 12   . Magnesium 500 MG TABS Take 500 mg by mouth 2 (two) times daily.     [name] nystatin (MYCOSTATIN/NYSTOP) powder Apply topically 3 (three) times daily.     [name] oxyCODONE (OXY IR/ROXICODONE) 5 MG immediate release tablet Take 1-2 tablets (5-10 mg total) by mouth every 6 (six) hours as needed for moderate pain (pain score 4-6) or severe pain (pain score 7-10) (5mg  moderate, 10mg  severe). 4 tablet 0   . OXYGEN Inhale 4 L into the lungs continuous. Use with resmed ventilator     . potassium chloride (KLOR-CON) 10 MEQ tablet Take 1 tablet (10 mEq total) by mouth 3 (three)  times daily. 270 tablet 0   . revefenacin (YUPELRI) 175 MCG/3ML nebulizer solution Take 175 mcg by nebulization daily.     [name] spironolactone (ALDACTONE) 25 MG tablet Take 12.5 mg by mouth daily.     [name] torsemide (DEMADEX) 20 MG tablet Take 2 tablets (40 mg total) by mouth daily. 60 tablet 0   . triamcinolone cream (KENALOG) 0.1 % Apply 1 Application topically 2 (two) times daily. 45 g 0   . Zinc Oxide (DESITIN MAXIMUM STRENGTH) 40 % PSTE Apply 1 Application topically 4 (four) times daily. Mix with aquaphor and apply to inflamed area      No current facility-administered medications for this encounter.   ROS  Review of Systems  Constitutional:  Positive for fatigue.  Cardiovascular:  Positive for leg swelling.  Gastrointestinal:        LLQ colostomy diverticulitis  Musculoskeletal:  Positive for gait problem.  Skin:  Positive for color change and wound.  Psychiatric/Behavioral: Negative.    All other systems reviewed and are negative. Vital signs:  BP (!) 125/59 (BP Location: Right Arm)   Pulse 76   Temp 98.2 F (36.8 C) (Oral)   Resp 18   SpO2 99%  Exam:  Physical Exam Vitals reviewed.  HENT:     Mouth/Throat:     Mouth: Mucous membranes are moist.  Pulmonary:     Breath sounds: Normal breath sounds.  Abdominal:     Palpations: Abdomen is soft.     Comments: Obese abdomen Distal end midline incision remains open and draining.    Musculoskeletal:     Comments: Uses wheelchair in hospital   Skin:    Findings: Erythema present.  Neurological:     General: No focal deficit present.     Mental Status: She is alert.  Psychiatric:        Mood and Affect: Mood normal.        Behavior: Behavior normal.    Stoma type/location:  LMQ colostomy Stomal assessment/size:  1 3/8" flush Peristomal assessment:  midline incision, contributes to leaks Treatment options for stomal/peristomal skin: 1 piece convex filtered pouch with barrier ring, stoma powder, skin prep and ostomy belt.   Has been set up with [employer] and received a partial shipment last month.  Output: soft brown stool  Ostomy pouching: 1pc. Convex  Education provided:  performed pouch change and this works better. No further  Aquacel in surgical wound creasing will wick effluent and promote seal of pouch.  NO further recommendations at this time  Empty when 1/3 full, wear belt as much as possible  to support weight of pouch.     Impression/dx  Midline surgical wound , improving. Scar from midline wound creates complication in ostomy pouching.  Discussion  Barrier ring and convex system to hold pouch in place.  Belt to promote seal.  Empty when 1/3 full to avoid weight.  Plan  See back as needed. Call [facility] for ongoing supplies.     Visit time: 45 minutes.   Mike Gip FNP-BC

## 2023-09-03 NOTE — Telephone Encounter (Signed)
 Pt aware and voiced understanding

## 2023-09-03 NOTE — Discharge Instructions (Signed)
 Cut thin strip of Aquacel and place in abdominal fold.  Change when showering.  Call [facility] regarding quantity of supplies  Allowed 20/month   Phone  [phone]

## 2023-09-07 ENCOUNTER — Telehealth: Payer: Self-pay | Admitting: Internal Medicine

## 2023-09-07 NOTE — Telephone Encounter (Signed)
 Copied from CRM [id number]. Topic: General - Other >> Sep 07, 2023  1:51 PM Eunice Blase wrote: Reason for CRM: Received call from [facility] per Imelda Pillow ph: [phone] reporting weight of pt I 178 lbs.

## 2023-09-09 ENCOUNTER — Encounter (HOSPITAL_COMMUNITY): Payer: Self-pay | Admitting: Internal Medicine

## 2023-09-09 ENCOUNTER — Ambulatory Visit (HOSPITAL_COMMUNITY)
Admission: RE | Admit: 2023-09-09 | Discharge: 2023-09-09 | Disposition: A | Discharge status: Home / Self Care | Payer: Medicare Other | Source: Ambulatory Visit | Attending: Internal Medicine | Admitting: Internal Medicine

## 2023-09-09 VITALS — BP 120/64 | HR 84 | Ht 60.0 in | Wt 177.6 lb

## 2023-09-09 DIAGNOSIS — E669 Obesity, unspecified: Secondary | ICD-10-CM | POA: Diagnosis not present

## 2023-09-09 DIAGNOSIS — J9611 Chronic respiratory failure with hypoxia: Secondary | ICD-10-CM | POA: Insufficient documentation

## 2023-09-09 DIAGNOSIS — I480 Paroxysmal atrial fibrillation: Secondary | ICD-10-CM | POA: Diagnosis not present

## 2023-09-09 DIAGNOSIS — N189 Chronic kidney disease, unspecified: Secondary | ICD-10-CM | POA: Insufficient documentation

## 2023-09-09 DIAGNOSIS — Z950 Presence of cardiac pacemaker: Secondary | ICD-10-CM | POA: Diagnosis not present

## 2023-09-09 DIAGNOSIS — Z87891 Personal history of nicotine dependence: Secondary | ICD-10-CM | POA: Insufficient documentation

## 2023-09-09 DIAGNOSIS — Z79899 Other long term (current) drug therapy: Secondary | ICD-10-CM | POA: Diagnosis not present

## 2023-09-09 DIAGNOSIS — J8489 Other specified interstitial pulmonary diseases: Secondary | ICD-10-CM | POA: Insufficient documentation

## 2023-09-09 DIAGNOSIS — I5042 Chronic combined systolic (congestive) and diastolic (congestive) heart failure: Secondary | ICD-10-CM | POA: Diagnosis present

## 2023-09-09 DIAGNOSIS — I2510 Atherosclerotic heart disease of native coronary artery without angina pectoris: Secondary | ICD-10-CM | POA: Diagnosis not present

## 2023-09-09 DIAGNOSIS — Z6834 Body mass index (BMI) 34.0-34.9, adult: Secondary | ICD-10-CM | POA: Diagnosis not present

## 2023-09-09 DIAGNOSIS — Z87440 Personal history of urinary (tract) infections: Secondary | ICD-10-CM | POA: Diagnosis not present

## 2023-09-09 DIAGNOSIS — Z9981 Dependence on supplemental oxygen: Secondary | ICD-10-CM | POA: Diagnosis not present

## 2023-09-09 DIAGNOSIS — I4811 Longstanding persistent atrial fibrillation: Secondary | ICD-10-CM | POA: Diagnosis not present

## 2023-09-09 DIAGNOSIS — I130 Hypertensive heart and chronic kidney disease with heart failure and stage 1 through stage 4 chronic kidney disease, or unspecified chronic kidney disease: Secondary | ICD-10-CM | POA: Insufficient documentation

## 2023-09-09 DIAGNOSIS — Z7901 Long term (current) use of anticoagulants: Secondary | ICD-10-CM | POA: Insufficient documentation

## 2023-09-09 DIAGNOSIS — G4733 Obstructive sleep apnea (adult) (pediatric): Secondary | ICD-10-CM | POA: Insufficient documentation

## 2023-09-09 LAB — BASIC METABOLIC PANEL WITH GFR
Anion gap: 12 (ref 5–15)
BUN: 21 mg/dL (ref 8–23)
CO2: 27 mmol/L (ref 22–32)
Calcium: 9.4 mg/dL (ref 8.9–10.3)
Chloride: 100 mmol/L (ref 98–111)
Creatinine, Ser: 0.7 mg/dL (ref 0.44–1.00)
GFR, Estimated: >60 mL/min (ref >60)
Glucose, Bld: 109 mg/dL — ABNORMAL HIGH (ref 70–99)
Potassium: 3.8 mmol/L (ref 3.5–5.1)
Sodium: 139 mmol/L (ref 135–145)

## 2023-09-09 LAB — BRAIN NATRIURETIC PEPTIDE: B Natriuretic Peptide: 79.5 pg/mL (ref 0.0–100.0)

## 2023-09-09 NOTE — Progress Notes (Signed)
 Per RN - closed

## 2023-09-09 NOTE — Patient Instructions (Signed)
 Medication Changes:  None, continue current medications  Lab Work:  Labs done today, your results will be available in MyChart, we will contact you for abnormal readings.   Special Instructions // Education:  Do the following things EVERYDAY: Weigh yourself in the morning before breakfast. Write it down and keep it in a log. Take your medicines as prescribed Eat low salt foods--Limit salt (sodium) to 2000 mg per day.  Stay as active as you can everyday Limit all fluids for the day to less than 2 liters   Follow-Up in: 4 months   At the [facility], you and your health needs are our priority. We have a designated team specialized in the treatment of Heart Failure. This Care Team includes your primary Heart Failure Specialized Cardiologist (physician), Advanced Practice Providers (APPs- Physician Assistants and Nurse Practitioners), and Pharmacist who all work together to provide you with the care you need, when you need it.   You may see any of the following providers on your designated Care Team at your next follow up:  Dr. Arvilla Meres Dr. Marca Ancona Dr. Dorthula Nettles Dr. Theresia Bough Tonye Becket, NP Robbie Lis, [place] [facility] [place], [place] Brynda Peon, NP [name], NP Karle Plumber, PharmD   Please be sure to bring in all your medications bottles to every appointment.   Need to Contact us:  If you have any questions or concerns before your next appointment please send [place] a message through [place] or call our office at [phone].    TO LEAVE A MESSAGE FOR THE NURSE SELECT OPTION 2, PLEASE LEAVE A MESSAGE INCLUDING: YOUR NAME DATE OF BIRTH CALL BACK NUMBER REASON FOR CALL**this is important as we prioritize the call backs  YOU WILL RECEIVE A CALL BACK THE SAME DAY AS LONG AS YOU CALL BEFORE 4:00 PM

## 2023-09-09 NOTE — Progress Notes (Signed)
 ADVANCED [facility] CLINIC NOTE   Primary Care: [name], MD Primary Cardiologist: Dr. Jeralyn Ruths ([facility]) HF Cardiologist: Dr. Gala Romney  HPI: Kathleen [name] is a 78 y.o. 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 Recently moved from [place] to [place] and referred by Dr. Jeralyn Ruths to establish cardiology care.   Previous echo with EF 40% but EF recovered. Echo ([facility]) 8/22 EF > 55% RV normal   She has been followed by Dr. Wilford Grist and Jeralyn Ruths at [facility]. Has maintained NSR on Tikosyn.   RHC + LHC (01/17/20): RA 12, PA mean 29, PCWP 13, CI 2.2, Coronary angiogram without any stenosis  RHC (02/08/20): RA 8, PAM 24, wedge 16, CI 2.5  Now followed by Dr. Francine Graven in Pulmonary for her BOOP.    Saw [place] in HF Clinic for the fist time in 9/22.  Zio 11/22: Predominantly AF with v-pacing with periods of sinus rhythm with V-pacing - avg HR of 75 bpm. 2. 2. Rare PACs and PVCs.   Seen in AF clinic 3/23, continued on dofetilide 250 bid.  Echo 4/24 EF 35-40% RV ok  Admitted 8/7-13/2024 with COPD flare.   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 Continues to recover from her surgery Was discharged from rehab 06/28/23. Lives at home. Alone. Remains SOB with mild activity. + feet swelling. Continues with HHPT. Has a hard time walking. Says she gets lightheaded frequently.   ICD: No VT/AF Activity level < 1hr. Optivol way up since surgery (? Accurate) Personally reviewed   Cardiac Studies  - Echo 10/23 and 11/23 EF 35-40% (Dr. Gala Romney felt 40-45%). Having episodes of CP that can wake her up from sleep. No exertional CP.   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     albuterol (PROVENTIL) (2.5 MG/3ML) 0.083% nebulizer solution USE 3 ML VIA NEBULIZER THREE TIMES DAILY     Cholecalciferol (VITAMIN D3) 50 MCG (2000 UT) TABS Take 2,000 Units by mouth at bedtime.     docusate sodium (COLACE) 100 MG capsule Take 100 mg by mouth daily.     dofetilide (TIKOSYN) 250 MCG capsule Take 1 capsule (250 mcg total) by mouth in the morning and at bedtime. 60 capsule 6   ELIQUIS 5 MG TABS tablet TAKE 1 TABLET(5 MG) BY MOUTH TWICE DAILY 60 tablet 0   febuxostat (ULORIC) 40 MG tablet Take 1 tablet (40 mg total) by mouth daily. 90 tablet 0   fluticasone furoate-vilanterol (BREO ELLIPTA) 100-25 MCG/ACT AEPB INHALE  1 PUFF INTO THE LUNGS DAILY 60 each 11   gabapentin (NEURONTIN) 100 MG capsule Take 2 capsules (200 mg total) by mouth at bedtime. 60 capsule 6   levalbuterol (XOPENEX) 0.63 MG/3ML nebulizer solution Take 3 mLs (0.63 mg total) by nebulization every 6 (six) hours as needed for wheezing or shortness of breath. DX J44.89 J96.21 3 mL 12   Magnesium 500 MG TABS Take 500 mg by mouth 2 (two) times daily.     nystatin (MYCOSTATIN/NYSTOP) powder Apply topically 3 (three) times daily.     oxyCODONE (OXY IR/ROXICODONE) 5 MG immediate release tablet Take 1-2 tablets (5-10 mg total) by mouth every 6 (six) hours as needed for moderate pain (pain  score 4-6) or severe pain (pain score 7-10) (5mg  moderate, 10mg  severe). 4 tablet 0   OXYGEN Inhale 4 L into the lungs continuous. Use with resmed ventilator     potassium chloride (KLOR-CON) 10 MEQ tablet Take 1 tablet (10 mEq total) by mouth 3 (three) times daily. 270 tablet 0   revefenacin (YUPELRI) 175 MCG/3ML nebulizer solution Take 175 mcg by nebulization daily.     spironolactone (ALDACTONE) 25 MG tablet Take 12.5 mg by mouth daily.     torsemide (DEMADEX) 20 MG tablet Take 2 tablets (40 mg total) by mouth daily. 60 tablet 0   triamcinolone cream (KENALOG) 0.1 % Apply 1 Application topically 2 (two) times daily. 45 g 0   Zinc Oxide (DESITIN MAXIMUM STRENGTH) 40 % PSTE Apply 1 Application topically 4 (four) times daily. Mix with aquaphor and apply to inflamed area     No current facility-administered medications for this encounter.   Allergies  Allergen Reactions   Allopurinol Other (See Comments)    Reaction:  Dizziness    Clindamycin Anaphylaxis and Hives   Flublok [Influenza Vaccine Recombinant] Other (See Comments)    Fever 103 with no alternative explanation day after vaccine.  Kathleen Braun Highfill FNP-C   Pneumococcal 13-Val Conj Vacc Itching, Swelling and Rash   Dronedarone Rash   Montelukast Other (See Comments)    Makes her loopy.   Brovana [Arformoterol]     Caused muscle pain   Budesonide     Caused extreme joint pain   Entresto [Sacubitril-Valsartan] Other (See Comments)    hypotension   Fosamax [Alendronate Sodium] Nausea Only   Jardiance [Empagliflozin]     Caused a vaginal infection   Meperidine Nausea And Vomiting   Microplegia Msa-Msg [Plegisol]     Loopy,diarrhea   Rosuvastatin Other (See Comments)    Reaction:  Muscle spasms    Tetracycline Hives   Adhesive [Tape] Rash   Lovastatin Rash and Other (See Comments)    Muscle Pain   Social History   Socioeconomic History   Marital status: Widowed    Spouse name: Not on file   Number of children: 1   Years  of education: Not on file   Highest education level: Not on file  Occupational History   Occupation: retired  Tobacco Use   Smoking status: Former    Current packs/day: 0.00    Average packs/day: 1 pack/day for 20.0 years (20.0 ttl pk-yrs)    Types: Cigarettes    Start date: 06/09/1967    Quit date: 06/09/1987    Years since quitting: 36.2    Passive exposure: Past   Smokeless tobacco: Never   Tobacco comments:    Former smoker 12/25/21  Vaping Use   Vaping status: Never Used  Substance and Sexual Activity  Alcohol use: No    Alcohol/week: 0.0 standard drinks of alcohol   Drug use: No   Sexual activity: Not Currently  Other Topics Concern   Not on file  Social History Narrative   Pt lives in [place] alone.  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  Her son works in [employer].   Social Drivers of Corporate investment banker Strain: Low Risk  (05/21/2022)   Overall Financial Resource Strain (CARDIA)    Difficulty of Paying Living Expenses: Not very hard  Food Insecurity: No Food Insecurity (04/22/2023)   Hunger Vital Sign    Worried About Running Out of Food in the Last Year: Never true    Ran Out of Food in the Last Year: Never true  Transportation Needs: No Transportation Needs (04/22/2023)   PRAPARE - Administrator, Civil Service (Medical): No    Lack of Transportation (Non-Medical): No  Physical Activity: Insufficiently Active (05/21/2022)   Exercise Vital Sign    Days of Exercise per Week: 2 days    Minutes of Exercise per Session: 20 min  Stress: No Stress Concern Present (05/21/2022)   [employer] of Occupational Health - Occupational Stress Questionnaire    Feeling of Stress : Only a little  Social Connections: Moderately Isolated (05/21/2022)   Social Connection and Isolation Panel [NHANES]    Frequency of Communication with Friends and Family: More than three times a week    Frequency of Social Gatherings with Friends and Family:  More than three times a week    Attends Religious Services: Never    Database administrator or Organizations: Yes    Attends Engineer, structural: More than 4 times per year    Marital Status: Widowed  Intimate Partner Violence: Not At Risk (04/22/2023)   Humiliation, Afraid, Rape, and Kick questionnaire    Fear of Current or Ex-Partner: No    Emotionally Abused: No    Physically Abused: No    Sexually Abused: No   Family History  Problem Relation Age of Onset   Breast cancer Mother 62       3 different times   Pancreatic cancer Mother    Emphysema Father    Healthy Brother    Breast cancer Cousin    Valvular heart disease Son    Obesity Daughter    Colon cancer Neg Hx    Esophageal cancer Neg Hx    Stomach cancer Neg Hx    Inflammatory bowel disease Neg Hx    Liver disease Neg Hx    Rectal cancer Neg Hx    BP 120/64   Pulse 84   Ht 5' (1.524 m)   Wt 80.6 kg (177 lb 9.6 oz)   SpO2 97%   BMI 34.69 kg/m   Wt Readings from Last 3 Encounters:  09/09/23 80.6 kg (177 lb 9.6 oz)  08/19/23 78.5 kg (173 lb)  08/03/23 79.7 kg (175 lb 9.6 oz)   PHYSICAL EXAM: General:  Chronically-ill appearing obese woman. Wearing O2 HEENT: normal Neck: supple. no JVD. Carotids 2+ bilat; no bruits. No lymphadenopathy or thryomegaly appreciated. Cor: PMI nondisplaced. Regular rate & rhythm. No rubs, gallops or murmurs. Lungs: clear Abdomen: obese soft, nontender, nondistended.+ ostomy No bruits or masses. Good bowel sounds. Extremities: no cyanosis, clubbing, rash, edema Neuro: alert & orientedx3, cranial nerves grossly intact. moves all 4 extremities w/o difficulty. Affect pleasant   Device interrogation (personally reviewed): No VT/AF Activity level <  1hr. Optivol way up since surgery (? Accurate) Personally reviewed  ReDS 23%  ECG A paced QTC Personally reviewed  ASSESSMENT & PLAN:   1. Chronic systolic/diastolic HF - Echo (1/61): EF 09%. RV ok  - Echo (10/23) and  (11/23) EF 35-40% (I thought 40-45%) - Echo 4/24 EF 35-40% RV ok Personally reviewed - R/L cath (10/23):  minimal CAD.  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 - Will continue torsemide 40 daily for now. Get labs and adjust as needed - I d/w with Kathleen [name] and will have Optivol recalibrated - Off losartan due to low BP and recent surgery. Will not restart today.  - Continue spiro 12.5 mg daily. - No b-blocker with recent low BPs and severe lung disease. - No Jardiance with frequent UTIs. - Encouraged compliance with Cardiomems but pillow is not working. Will work on getting her a new pillow - Labs today - needs repeat echo   2. 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 Francine Graven) - Followed by Dr. Vassie Loll - Continue O2. No change  3. Persistent AF - s/p AVN ablation and PM (failed upgrade to BiV - Maintaining NSR on dofetilide. QTC 486 - Followed by EP/AF Clinic - Continue Eliquis 5 bid. No bleeding  4. Morbid obesity  - Body mass index is 34.69 kg/m. - weight down after surgery  5. OSA - Continue Bipap  6. Recent x-lap with ostomy - continue HH  I spent a total of 45 minutes today: 1) reviewing the patient's medical records including previous charts, labs and recent notes from other providers; 2) examining the patient and counseling them on their medical issues/explaining the plan of care; 3) adjusting meds as needed and 4) ordering lab work or other needed tests.    Arvilla Meres, MD  1:52 PM

## 2023-09-09 NOTE — Progress Notes (Signed)
 ReDS Vest / Clip - 09/09/23 1346       ReDS Vest / Clip   Station Marker A    Ruler Value 31    ReDS Value Range Low volume    ReDS Actual Value 32

## 2023-09-10 ENCOUNTER — Ambulatory Visit: Payer: Self-pay

## 2023-09-10 DIAGNOSIS — T8189XA Other complications of procedures, not elsewhere classified, initial encounter: Secondary | ICD-10-CM | POA: Insufficient documentation

## 2023-09-10 NOTE — Telephone Encounter (Signed)
 Copied from CRM [id number]. Topic: Clinical - Medication Question >> Sep 10, 2023  1:11 PM Saverio Danker wrote: Reason for CRM: Patient is calling and wanting to know if the Levothyroxine is making it hard for her to function. She is in pain sometime and it makes it hard for her to talk   Chief Complaint: medication problem  Symptoms: severe hip pain, difficulty walking and trouble sleeping Frequency: several weeks  Pertinent Negatives: Patient denies swelling  Disposition: [] ED /[] Urgent Care (no appt availability in office) / [x] Appointment(In office/virtual)/ []  [place] [facility]/ [] Home Care/ [] Refused Recommended Disposition /[] [facility]/ [x]  Follow-up with PCP Additional Notes: pt states she recently started levothyroxine and noticed having severe hip pain. She read the information packet from pharmacy and states if severe hip pain to call PCP, pt states she stopped the medication 1 week ago and still having the hip pain and getting worse to where it affecting mobility and sleep. Pt unsure if r/t to medication or not but doesn't see what else could cause it. States she had colon surgery several weeks ago and was cut from navel to pelvis and now has a wound that is still healing but didn't figure hip pain would be from that. Pt states she was unsure if she needed to see PCP or an ortho specialist. Offered pt appt with practice on 09/13/23 or advised pt could call [facility] Monday and see if they can see her as well. Pt states she wants to call them first and if unable to be seen will call PCP back. Pt states pain to point where Tramadol is not helping with pain. Advised pt she can try heat/ice to see if any relief but if prescribed pain meds not helping didn't figure OTC would either. Pt verbalized understanding.   Reason for Disposition  [1] Caller has URGENT medicine question about med that PCP or specialist prescribed AND [2] triager unable to answer question  Answer  Assessment - Initial Assessment Questions 1. NAME of MEDICINE: "What medicine(s) are you calling about?"     Levothyroxine 2. QUESTION: "What is your question?" (e.g., double dose of medicine, side effect)     Feels like having SE and has stopped medication x 1 week  3. PRESCRIBER: "Who prescribed the medicine?" Reason: if prescribed by specialist, call should be referred to that group.     Dr. Yetta Barre 4. SYMPTOMS: "Do you have any symptoms?" If Yes, ask: "What symptoms are you having?"  "How bad are the symptoms (e.g., mild, moderate, severe)     Severe hip pain, having difficulty walking and trouble sleeping  Protocols used: Medication Question Call-A-AH

## 2023-09-10 NOTE — Telephone Encounter (Signed)
 Copied from CRM [id number]. Topic: General - Other >> Sep 10, 2023  1:13 PM Adrionna Y wrote: Reason for CRM:  Received call from [facility] per Imelda Pillow ph: [phone] reporting weight of pt I 176 lbs.

## 2023-09-13 ENCOUNTER — Ambulatory Visit (HOSPITAL_BASED_OUTPATIENT_CLINIC_OR_DEPARTMENT_OTHER): Admitting: Student

## 2023-09-13 ENCOUNTER — Encounter (HOSPITAL_BASED_OUTPATIENT_CLINIC_OR_DEPARTMENT_OTHER): Payer: Self-pay | Admitting: Student

## 2023-09-13 ENCOUNTER — Ambulatory Visit (HOSPITAL_BASED_OUTPATIENT_CLINIC_OR_DEPARTMENT_OTHER)

## 2023-09-13 ENCOUNTER — Other Ambulatory Visit (HOSPITAL_BASED_OUTPATIENT_CLINIC_OR_DEPARTMENT_OTHER): Payer: Self-pay

## 2023-09-13 DIAGNOSIS — M25552 Pain in left hip: Secondary | ICD-10-CM

## 2023-09-13 DIAGNOSIS — M5441 Lumbago with sciatica, right side: Secondary | ICD-10-CM

## 2023-09-13 DIAGNOSIS — M25551 Pain in right hip: Secondary | ICD-10-CM

## 2023-09-13 DIAGNOSIS — M5442 Lumbago with sciatica, left side: Secondary | ICD-10-CM

## 2023-09-13 MED ORDER — TRAMADOL HCL 50 MG PO TABS
50.0000 mg | ORAL_TABLET | Freq: Four times a day (QID) | ORAL | 0 refills | Status: AC | PRN
  Filled 2023-09-13: qty 20, 5d supply, fill #0

## 2023-09-13 NOTE — Progress Notes (Signed)
 Chief Complaint: Bilateral hip pain    Discussed the use of AI scribe software for clinical note transcription with the patient, who gave verbal consent to proceed.  History of Present Illness The patient presents with bilateral hip pain for evaluation. She is accompanied by her son, [name].  She has been experiencing bilateral hip pain for approximately two weeks without any preceding injury. The pain is worsening, described as deep, and affects her ability to sit comfortably on her sofa and bed. It is located in the back of the hips and sometimes radiates down to her mid-thighs, particularly when standing up. No groin pain is reported. The pain is severe enough to impact her sleep, and she finds it difficult to walk, often requiring the use of a walker when outside her apartment.  Her current medications include oxycodone, which she finds ineffective, and tramadol 50 mg, which she takes at night to help with sleep, though it does not completely alleviate the pain. She uses a walker for balance and to assist with carrying her oxygen tank, which she requires due to its weight. She resides in an independent living facility and relies on transportation services for medical appointments, which require advance scheduling.   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    Concussion 10/04/2021   COPD (chronic obstructive pulmonary disease) (HCC)    Diverticulitis    Gout    Hypertension    Longstanding persistent atrial fibrillation (HCC)    on Xarelto   Nonischemic cardiomyopathy (HCC)    Obesity    Pacemaker    Spontaneous pneumothorax 2013   Past Surgical History:  Procedure  Laterality Date   ABDOMINAL HYSTERECTOMY     APPENDECTOMY     APPLICATION OF WOUND VAC  04/27/2023   Procedure: APPLICATION OF WOUND VAC;  Surgeon: Emelia Loron, MD;  Location: [facility];  Service: General;;   ATRIAL FIBRILLATION ABLATION  07/20/2013   by Dr Christin Fudge   AV nodal ablation  11/01/2013   by Dr Christin Fudge, repeated by Dr Wilford Grist   BREAST BIOPSY Bilateral 1997   negative   CARDIAC CATHETERIZATION     CHOLECYSTECTOMY     COLON RESECTION SIGMOID N/A 04/27/2023   Procedure: COLON RESECTION SIGMOID WITH COLOSTOMY;  Surgeon: Emelia Loron, MD;  Location: [facility] OR;  Service: General;  Laterality: N/A;   ESOPHAGOGASTRODUODENOSCOPY (EGD) WITH PROPOFOL N/A 05/24/2023   Procedure: ESOPHAGOGASTRODUODENOSCOPY (EGD) WITH PROPOFOL;  Surgeon: Beverley Fiedler, MD;  Location: [facility] ENDOSCOPY;  Service: Gastroenterology;  Laterality: N/A;   HERNIA REPAIR     LAPAROTOMY N/A 04/27/2023   Procedure: EXPLORATORY LAPAROTOMY;  Surgeon: Emelia Loron, MD;  Location: [facility] OR;  Service: General;  Laterality: N/A;   PACEMAKER INSERTION  06/2017   MDT Viva CRT-P implanted by Dr Christin Fudge after AV nodal ablation,  LV lead could not be placed   PACEMAKER INSERTION  05/2020   with lead bundle   RIGHT/LEFT HEART CATH AND CORONARY ANGIOGRAPHY N/A 03/20/2022   Procedure: RIGHT/LEFT HEART CATH AND CORONARY ANGIOGRAPHY;  Surgeon: [place],  Demetria Pore, MD;  Location: [facility] INVASIVE CV LAB;  Service: Cardiovascular;  Laterality: N/A;   Social History   Socioeconomic History   Marital status: Widowed    Spouse name: Not on file   Number of children: 1   Years of education: Not on file   Highest education level: Not on file  Occupational History   Occupation: retired  Tobacco Use   Smoking status: Former    Current packs/day: 0.00    Average packs/day: 1 pack/day for 20.0 years (20.0 ttl pk-yrs)    Types: Cigarettes    Start date: 06/09/1967    Quit date: 06/09/1987    Years since quitting: 36.2    Passive exposure:  Past   Smokeless tobacco: Never   Tobacco comments:    Former smoker 12/25/21  Vaping Use   Vaping status: Never Used  Substance and Sexual Activity   Alcohol use: No    Alcohol/week: 0.0 standard drinks of alcohol   Drug use: No   Sexual activity: Not Currently  Other Topics Concern   Not on file  Social History Narrative   Pt lives in [place] alone.  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  Her son works in [employer].   Social Drivers of Corporate investment banker Strain: Low Risk  (05/21/2022)   Overall Financial Resource Strain (CARDIA)    Difficulty of Paying Living Expenses: Not very hard  Food Insecurity: No Food Insecurity (04/22/2023)   Hunger Vital Sign    Worried About Running Out of Food in the Last Year: Never true    Ran Out of Food in the Last Year: Never true  Transportation Needs: No Transportation Needs (04/22/2023)   PRAPARE - Administrator, Civil Service (Medical): No    Lack of Transportation (Non-Medical): No  Physical Activity: Insufficiently Active (05/21/2022)   Exercise Vital Sign    Days of Exercise per Week: 2 days    Minutes of Exercise per Session: 20 min  Stress: No Stress Concern Present (05/21/2022)   [employer] of Occupational Health - Occupational Stress Questionnaire    Feeling of Stress : Only a little  Social Connections: Moderately Isolated (05/21/2022)   Social Connection and Isolation Panel [NHANES]    Frequency of Communication with Friends and Family: More than three times a week    Frequency of Social Gatherings with Friends and Family: More than three times a week    Attends Religious Services: Never    Database administrator or Organizations: Yes    Attends Engineer, structural: More than 4 times per year    Marital Status: Widowed   Family History  Problem Relation Age of Onset   Breast cancer Mother 45       3 different times   Pancreatic cancer Mother    Emphysema Father     Healthy Brother    Breast cancer Cousin    Valvular heart disease Son    Obesity Daughter    Colon cancer Neg Hx    Esophageal cancer Neg Hx    Stomach cancer Neg Hx    Inflammatory bowel disease Neg Hx    Liver disease Neg Hx    Rectal cancer Neg Hx    Allergies  Allergen Reactions   Allopurinol Other (See Comments)    Reaction:  Dizziness    Clindamycin Anaphylaxis and Hives   Flublok [Influenza Vaccine Recombinant] Other (See Comments)    Fever  103 with no alternative explanation day after vaccine.  Clydie Braun Highfill FNP-C   Pneumococcal 13-Val Conj Vacc Itching, Swelling and Rash   Dronedarone Rash   Montelukast Other (See Comments)    Makes her loopy.   Brovana [Arformoterol]     Caused muscle pain   Budesonide     Caused extreme joint pain   Entresto [Sacubitril-Valsartan] Other (See Comments)    hypotension   Fosamax [Alendronate Sodium] Nausea Only   Jardiance [Empagliflozin]     Caused a vaginal infection   Meperidine Nausea And Vomiting   Microplegia Msa-Msg [Plegisol]     Loopy,diarrhea   Rosuvastatin Other (See Comments)    Reaction:  Muscle spasms    Tetracycline Hives   Adhesive [Tape] Rash   Lovastatin Rash and Other (See Comments)    Muscle Pain   Current Outpatient Medications  Medication Sig Dispense Refill   traMADol (ULTRAM) 50 MG tablet Take 1 tablet (50 mg total) by mouth every 6 (six) hours as needed for up to 5 days. 20 tablet 0   acetaminophen (TYLENOL) 500 MG tablet Take 1,000 mg by mouth as needed.     albuterol (PROVENTIL) (2.5 MG/3ML) 0.083% nebulizer solution USE 3 ML VIA NEBULIZER THREE TIMES DAILY     Cholecalciferol (VITAMIN D3) 50 MCG (2000 UT) TABS Take 2,000 Units by mouth at bedtime.     docusate sodium (COLACE) 100 MG capsule Take 100 mg by mouth daily.     dofetilide (TIKOSYN) 250 MCG capsule Take 1 capsule (250 mcg total) by mouth in the morning and at bedtime. 60 capsule 6   ELIQUIS 5 MG TABS tablet TAKE 1 TABLET(5 MG) BY MOUTH  TWICE DAILY 60 tablet 0   febuxostat (ULORIC) 40 MG tablet Take 1 tablet (40 mg total) by mouth daily. 90 tablet 0   fluticasone furoate-vilanterol (BREO ELLIPTA) 100-25 MCG/ACT AEPB INHALE 1 PUFF INTO THE LUNGS DAILY 60 each 11   gabapentin (NEURONTIN) 100 MG capsule Take 2 capsules (200 mg total) by mouth at bedtime. 60 capsule 6   levalbuterol (XOPENEX) 0.63 MG/3ML nebulizer solution Take 3 mLs (0.63 mg total) by nebulization every 6 (six) hours as needed for wheezing or shortness of breath. DX J44.89 J96.21 3 mL 12   Magnesium 500 MG TABS Take 500 mg by mouth 2 (two) times daily.     nystatin (MYCOSTATIN/NYSTOP) powder Apply topically 3 (three) times daily.     oxyCODONE (OXY IR/ROXICODONE) 5 MG immediate release tablet Take 1-2 tablets (5-10 mg total) by mouth every 6 (six) hours as needed for moderate pain (pain score 4-6) or severe pain (pain score 7-10) (5mg  moderate, 10mg  severe). 4 tablet 0   OXYGEN Inhale 4 L into the lungs continuous. Use with resmed ventilator     potassium chloride (KLOR-CON) 10 MEQ tablet Take 1 tablet (10 mEq total) by mouth 3 (three) times daily. 270 tablet 0   revefenacin (YUPELRI) 175 MCG/3ML nebulizer solution Take 175 mcg by nebulization daily.     spironolactone (ALDACTONE) 25 MG tablet Take 12.5 mg by mouth daily.     torsemide (DEMADEX) 20 MG tablet Take 2 tablets (40 mg total) by mouth daily. 60 tablet 0   triamcinolone cream (KENALOG) 0.1 % Apply 1 Application topically 2 (two) times daily. 45 g 0   Zinc Oxide (DESITIN MAXIMUM STRENGTH) 40 % PSTE Apply 1 Application topically 4 (four) times daily. Mix with aquaphor and apply to inflamed area     No current facility-administered medications  for this visit.   No results found.  Review of Systems:   A ROS was performed including pertinent positives and negatives as documented in the HPI.  Physical Exam :   Constitutional: NAD and appears stated age Neurological: Alert and oriented Psych: Appropriate  affect and cooperative There were no vitals taken for this visit.   Comprehensive Musculoskeletal Exam:    No significant midline tenderness over the lumbar spine.  Tenderness over bilateral SI joints.  Passive hip ROM bilaterally to 110 degrees flexion and 20 degrees internal/external rotation.  Knee flexion/extension strength 4/5 and ankle dorsiflexion/plantarflexion strength 5/5.  Patellar DTRs 2+.  Imaging:   Xray (lumbar spine 4 views): Low-grade spondylolisthesis of L4-L5 with mild disc space narrowing at L4-L5 and L5-S1.  Mild diffuse facet arthropathy.  Otherwise normal alignment and well-maintained disc spacing.  Xray (AP pelvis, right hip 3 views, left hip 3 views): Mild degenerative changes of the hip joints bilaterally    I personally reviewed and interpreted the radiographs.      Assessment & Plan Bilateral hip pain   Pain is located mainly in the posterior hips, more so over bilateral SI joints.  Discussed potential for SI joint dysfunction versus more of a lumbar cause of her symptoms.  Her current pain is more confined to this area without significant radiation down the lower extremities, but this does happen occasionally to the mid thigh.  I believe she may benefit from SI joint injections with Dr. Madelyn Brunner, who she has seen before.  Should she be a good candidate for this, discussed that if this does not significantly improve her symptoms we may need to further assess the low back with further imaging.  Will send in a short refill of tramadol 50 mg to be used only as needed as this has brought her some pain relief.  Can plan to follow-up with me for reassessment or further evaluation if needed.     I personally saw and evaluated the patient, and participated in the management and treatment plan.  Hazle Nordmann, PA-C Orthopedics

## 2023-09-14 ENCOUNTER — Telehealth: Payer: Self-pay | Admitting: Internal Medicine

## 2023-09-14 ENCOUNTER — Ambulatory Visit: Attending: Cardiology

## 2023-09-14 DIAGNOSIS — Z950 Presence of cardiac pacemaker: Secondary | ICD-10-CM

## 2023-09-14 DIAGNOSIS — I5042 Chronic combined systolic (congestive) and diastolic (congestive) heart failure: Secondary | ICD-10-CM | POA: Diagnosis not present

## 2023-09-14 NOTE — Telephone Encounter (Signed)
 Copied from CRM [id number]. Topic: Clinical - Home Health Verbal Orders >> Sep 14, 2023  3:10 PM Florestine Avers wrote: Caller/Agency: Imelda Pillow PTA [facility] Callback Number: [phone] Service Requested: Other (Weight Check In) Frequency: Patients weight is outside of the perimeters, her weight is 176lbs she is up 2 lbs. Last week the weight was the same. Any new concerns about the patient? Yes, please call Imelda Pillow about patients weight check in. Besides that, he denies any additional concerns with the patient.

## 2023-09-15 ENCOUNTER — Ambulatory Visit (INDEPENDENT_AMBULATORY_CARE_PROVIDER_SITE_OTHER)

## 2023-09-15 VITALS — Ht 62.0 in | Wt 177.0 lb

## 2023-09-15 DIAGNOSIS — Z1159 Encounter for screening for other viral diseases: Secondary | ICD-10-CM

## 2023-09-15 DIAGNOSIS — Z0000 Encounter for general adult medical examination without abnormal findings: Secondary | ICD-10-CM

## 2023-09-15 NOTE — Progress Notes (Signed)
 Subjective:   Kathleen [name] is a 78 y.o. who presents for a Medicare Wellness preventive visit.  Visit Complete: Virtual I connected with  Kathleen [name] on 09/15/23 by a audio enabled telemedicine application and verified that I am speaking with the correct person using two identifiers.  Patient Location: Home  Provider Location: Office/Clinic  I discussed the limitations of evaluation and management by telemedicine. The patient expressed understanding and agreed to proceed.  Vital Signs: Because this visit was a virtual/telehealth visit, some criteria may be missing or patient reported. Any vitals not documented were not able to be obtained and vitals that have been documented are patient reported.  VideoDeclined- This patient declined Librarian, academic. Therefore the visit was completed with audio only.  Persons Participating in Visit: Patient.  AWV Questionnaire: No: Patient Medicare AWV questionnaire was not completed prior to this visit.  Cardiac Risk Factors include: advanced age (>37men, >107 women);diabetes mellitus;dyslipidemia;hypertension;obesity (BMI >30kg/m2)     Objective:    Today's Vitals   09/15/23 1141 09/15/23 1142  Weight: 177 lb (80.3 kg)   Height: 5\' 2"  (1.575 m)   PainSc:  7   PainLoc: Shoulder    Body mass index is 32.37 kg/m.     09/15/2023   11:39 AM 08/19/2023    8:23 PM 04/22/2023    9:00 PM 04/22/2023    7:35 PM 04/09/2023    8:09 PM 04/09/2023    5:29 PM 04/09/2023   11:15 AM  Advanced Directives  Does Patient Have a Medical Advance Directive? Yes Yes  No Yes Yes No  Type of Estate agent of [place];Living will Living will;Healthcare Power of Attorney   Living will Healthcare Power of [place];Living will   Does patient want to make changes to medical advance directive?     No - Patient declined Yes (Inpatient - patient defers changing a medical advance directive and declines information at  this time)   Copy of Healthcare Power of Attorney in Chart? No - copy requested    No - copy requested No - copy requested   Would patient like information on creating a medical advance directive?   No - Patient declined  No - Patient declined      Current Medications (verified) Outpatient Encounter Medications as of 09/15/2023  Medication Sig   acetaminophen (TYLENOL) 500 MG tablet Take 1,000 mg by mouth as needed.   albuterol (PROVENTIL) (2.5 MG/3ML) 0.083% nebulizer solution USE 3 ML VIA NEBULIZER THREE TIMES DAILY   Cholecalciferol (VITAMIN D3) 50 MCG (2000 UT) TABS Take 2,000 Units by mouth at bedtime.   docusate sodium (COLACE) 100 MG capsule Take 100 mg by mouth daily.   dofetilide (TIKOSYN) 250 MCG capsule Take 1 capsule (250 mcg total) by mouth in the morning and at bedtime.   ELIQUIS 5 MG TABS tablet TAKE 1 TABLET(5 MG) BY MOUTH TWICE DAILY   febuxostat (ULORIC) 40 MG tablet Take 1 tablet (40 mg total) by mouth daily.   fluticasone furoate-vilanterol (BREO ELLIPTA) 100-25 MCG/ACT AEPB INHALE 1 PUFF INTO THE LUNGS DAILY   gabapentin (NEURONTIN) 100 MG capsule Take 2 capsules (200 mg total) by mouth at bedtime.   levalbuterol (XOPENEX) 0.63 MG/3ML nebulizer solution Take 3 mLs (0.63 mg total) by nebulization every 6 (six) hours as needed for wheezing or shortness of breath. DX J44.89 J96.21   Magnesium 500 MG TABS Take 500 mg by mouth 2 (two) times daily.   nystatin (MYCOSTATIN/NYSTOP) powder  Apply topically 3 (three) times daily.   oxyCODONE (OXY IR/ROXICODONE) 5 MG immediate release tablet Take 1-2 tablets (5-10 mg total) by mouth every 6 (six) hours as needed for moderate pain (pain score 4-6) or severe pain (pain score 7-10) (5mg  moderate, 10mg  severe).   OXYGEN Inhale 4 L into the lungs continuous. Use with resmed ventilator   potassium chloride (KLOR-CON) 10 MEQ tablet Take 1 tablet (10 mEq total) by mouth 3 (three) times daily.   revefenacin (YUPELRI) 175 MCG/3ML nebulizer  solution Take 175 mcg by nebulization daily.   spironolactone (ALDACTONE) 25 MG tablet Take 12.5 mg by mouth daily.   torsemide (DEMADEX) 20 MG tablet Take 2 tablets (40 mg total) by mouth daily.   traMADol (ULTRAM) 50 MG tablet Take 1 tablet (50 mg total) by mouth every 6 (six) hours as needed for up to 5 days.   triamcinolone cream (KENALOG) 0.1 % Apply 1 Application topically 2 (two) times daily.   Zinc Oxide (DESITIN MAXIMUM STRENGTH) 40 % PSTE Apply 1 Application topically 4 (four) times daily. Mix with aquaphor and apply to inflamed area   No facility-administered encounter medications on file as of 09/15/2023.    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    Concussion 10/04/2021   COPD (chronic obstructive pulmonary disease) (HCC)    Diverticulitis    Gout    Hypertension    Longstanding persistent atrial fibrillation (HCC)    on Xarelto   Nonischemic cardiomyopathy (HCC)    Obesity    Pacemaker    Spontaneous pneumothorax 2013   Past Surgical History:  Procedure Laterality Date   ABDOMINAL HYSTERECTOMY     APPENDECTOMY     APPLICATION OF WOUND VAC  04/27/2023   Procedure: APPLICATION OF WOUND VAC;  Surgeon: Emelia Loron, MD;  Location: [facility] OR;  Service: General;;   ATRIAL FIBRILLATION ABLATION  07/20/2013   by Dr Christin Fudge   AV nodal ablation  11/01/2013   by Dr Christin Fudge, repeated by Dr Wilford Grist    BREAST BIOPSY Bilateral 1997   negative   CARDIAC CATHETERIZATION     CHOLECYSTECTOMY     COLON RESECTION SIGMOID N/A 04/27/2023   Procedure: COLON RESECTION SIGMOID WITH COLOSTOMY;  Surgeon: Emelia Loron, MD;  Location: [facility] OR;  Service: General;  Laterality: N/A;   ESOPHAGOGASTRODUODENOSCOPY (EGD) WITH PROPOFOL N/A 05/24/2023   Procedure: ESOPHAGOGASTRODUODENOSCOPY (EGD) WITH PROPOFOL;  Surgeon: Beverley Fiedler, MD;  Location[facility] ENDOSCOPY;  Service: Gastroenterology;  Laterality: N/A;   HERNIA REPAIR     LAPAROTOMY N/A 04/27/2023   Procedure: EXPLORATORY LAPAROTOMY;  Surgeon: Emelia Loron, MD;  Location: [facility] OR;  Service: General;  Laterality: N/A;   PACEMAKER INSERTION  06/2017   MDT Viva CRT-P implanted by Dr Christin Fudge after AV nodal ablation,  LV lead could not be placed   PACEMAKER INSERTION  05/2020   with lead bundle   RIGHT/LEFT HEART CATH AND CORONARY ANGIOGRAPHY N/A 03/20/2022   Procedure: RIGHT/LEFT HEART CATH AND CORONARY ANGIOGRAPHY;  Surgeon: [place], Peter M, MD;  Location: [facility] INVASIVE CV LAB;  Service: Cardiovascular;  Laterality: N/A;   Family History  Problem Relation Age of Onset   Breast cancer Mother 8       3 different times   Pancreatic cancer Mother    Emphysema Father    Healthy Brother    Breast cancer Cousin    Valvular heart disease Son    Obesity Daughter    Colon cancer Neg Hx    Esophageal cancer Neg Hx    Stomach cancer Neg Hx    Inflammatory bowel disease Neg Hx    Liver disease Neg Hx    Rectal cancer Neg Hx    Social History   Socioeconomic History   Marital status: Widowed    Spouse name: Not on file   Number of children: 1   Years of education: Not on file   Highest education level: Not on file  Occupational History   Occupation: retired  Tobacco Use   Smoking status: Former    Current packs/day: 0.00    Average packs/day: 1 pack/day for 20.0 years (20.0 ttl pk-yrs)    Types: Cigarettes    Start date: 06/09/1967    Quit  date: 06/09/1987    Years since quitting: 36.2    Passive exposure: Past   Smokeless tobacco: Never   Tobacco comments:    Former smoker 12/25/21  Vaping Use   Vaping status: Never Used  Substance and Sexual Activity   Alcohol use: No    Alcohol/week: 0.0 standard drinks of alcohol   Drug use: No   Sexual activity: Not Currently  Other Topics Concern   Not on file  Social History Narrative   Pt lives in [place] alone.  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  Her son works in [employer].   Social Drivers of Corporate investment banker Strain: Low Risk  (09/15/2023)   Overall Financial Resource Strain (CARDIA)    Difficulty of Paying Living Expenses: Not hard at all  Food Insecurity: No Food Insecurity (09/15/2023)   Hunger Vital Sign    Worried About Running Out of Food in the Last Year: Never true    Ran Out of Food in the Last Year: Never true  Transportation Needs: No Transportation Needs (09/15/2023)   PRAPARE - Administrator, Civil Service (Medical): No    Lack of Transportation (Non-Medical): No  Physical Activity: Insufficiently Active (09/15/2023)   Exercise Vital Sign    Days of Exercise per Week: 2 days    Minutes of Exercise per Session: 60 min  Stress: Stress Concern Present (09/15/2023)   [employer] of Occupational Health - Occupational Stress Questionnaire    Feeling of Stress : To some extent  Social Connections: Socially Isolated (09/15/2023)   Social Connection and Isolation Panel [NHANES]    Frequency of Communication with Friends and Family: More than three times a week    Frequency of Social Gatherings with Friends and Family: Never    Attends Religious Services: Never    Database administrator or Organizations: No    Attends Banker Meetings: Never    Marital Status: Widowed    Tobacco Counseling Counseling given: No Tobacco comments: Former smoker 12/25/21    Clinical Intake:  Pre-visit preparation completed:  Yes  Pain : 0-10 Pain Score: 7  Pain Location: Shoulder Pain Orientation: Right, Left Pain Descriptors / Indicators: Constant Pain Onset: More than a month ago Pain Frequency: Constant Pain Relieving Factors: pain meds  Pain Relieving Factors: pain meds  BMI - recorded: 32.37 Nutritional Status: BMI > 30  Obese Nutritional Risks: None Diabetes: Yes CBG done?: No Did pt. bring in CBG monitor from home?: No  Lab Results  Component Value Date   HGBA1C 5.9 06/30/2023   HGBA1C 6.2 (H) 04/23/2023   HGBA1C 5.5 02/22/2023     How often do you need to have someone help you when you read instructions, pamphlets, or other written materials from your doctor or pharmacy?: 1 - Never  Interpreter Needed?: No  Information entered by :: Hassell Halim, CMA   Activities of Daily Living     09/15/2023   11:46 AM 04/22/2023    9:00 PM  In your present state of health, do you have any difficulty performing the following activities:  Hearing? 0 0  Vision? 0 0  Difficulty concentrating or making decisions? 0 0  Walking or climbing stairs? 0   Dressing or bathing? 0   Doing errands, shopping? 0   Preparing Food and eating ? N   Using the Toilet? N   In the past six months, have you accidently leaked urine? N   Do you have problems with loss of bowel control? N   Managing your Medications? N   Managing your Finances? N   Housekeeping or managing your Housekeeping? N     Patient Care Team: [name], MD as PCP - General (Internal Medicine) Regan Lemming, MD as PCP - Electrophysiology (Cardiology) Laurey Morale, MD as PCP - Cardiology (Cardiology) Lurline Idol, FNP as Nurse Practitioner [facility] and Palliative Medicine) Mansouraty, Netty Starring., MD as Consulting Physician (Gastroenterology) Sherlon Handing, MD as Consulting Physician (Endocrinology)  Indicate any recent Medical Services you may have received from other than [facility] providers in the past year  (date may be approximate).     Assessment:   This is a routine wellness examination for Kathleen [name].  Hearing/Vision screen Hearing Screening - Comments:: Denies hearing difficulties   Vision Screening - Comments:: Wears rx glasses - up to date with routine eye exams with Dr Benjamine Mola   Goals Addressed               This Visit's Progress     Patient Stated (pt-stated)        Patient stated she wants to be able to use her shoulder better       Depression Screen     09/15/2023   11:51 AM 08/03/2023    1:56 PM 05/21/2022   12:53 PM 05/14/2022   10:44 AM 01/22/2021    2:51 PM 04/05/2018    3:05 PM  PHQ 2/9 Scores  PHQ - 2 Score 0 0 1 0 0 0  PHQ- 9 Score 2 0  0      Fall Risk     09/15/2023   11:47 AM 08/03/2023    1:56 PM 05/21/2022    1:12 PM 05/14/2022   10:44 AM 01/22/2021    2:50 PM  Fall Risk   Falls in the past year? 0 0 1 0 0  Number falls in past yr: 0 0 0 0 0  Injury with Fall? 0 0 1 0 0  Risk for fall due to : No Fall Risks No Fall Risks History of fall(s);Impaired balance/gait;Impaired mobility No Fall Risks   Follow up Falls prevention discussed;Falls evaluation completed Falls evaluation completed Falls evaluation completed Falls evaluation completed     MEDICARE RISK AT HOME:  Medicare Risk  at Home Any stairs in or around the home?: No If so, are there any without handrails?: No Home free of loose throw rugs in walkways, pet beds, electrical cords, etc?: Yes Adequate lighting in your home to reduce risk of falls?: Yes Life alert?: Yes Use of a cane, walker or w/c?: No Grab bars in the bathroom?: Yes Shower chair or bench in shower?: Yes Elevated toilet seat or a handicapped toilet?: Yes  TIMED UP AND GO:  Was the test performed?  No  Cognitive Function: 6CIT completed        09/15/2023   11:47 AM 05/21/2022    1:04 PM  6CIT Screen  What Year? 0 points 0 points  What month? 0 points 0 points  What time? 0 points 0 points  Count back from 20 0 points  0 points  Months in reverse 0 points 0 points  Repeat phrase 0 points 0 points  Total Score 0 points 0 points    Immunizations Immunization History  Administered Date(s) Administered   Fluad Quad(high Dose 65+) 04/22/2021, 03/16/2022   Hepatitis A, Adult 04/12/2001   Influenza, High Dose Seasonal PF 03/16/2019   PFIZER Comirnaty(Gray Top)Covid-19 Tri-Sucrose Vaccine 03/29/2020, 10/09/2020   PFIZER(Purple Top)SARS-COV-2 Vaccination 07/13/2019, 08/08/2019   Pneumococcal Conjugate-13 08/30/2014   Pneumococcal Polysaccharide-23 03/31/2019   Tdap 10/02/2013   Yellow Fever 04/12/2001    Screening Tests Health Maintenance  Topic Date Due   Hepatitis C Screening  Never done   Zoster Vaccines- Shingrix (1 of 2) Never done   DEXA SCAN  Never done   FOOT EXAM  04/23/2022   COVID-19 Vaccine (5 - 2024-25 season) 02/07/2023   OPHTHALMOLOGY EXAM  08/07/2023   DTaP/Tdap/Td (2 - Td or Tdap) 10/03/2023   HEMOGLOBIN A1C  12/28/2023   INFLUENZA VACCINE  01/07/2024   Pneumonia Vaccine 15+ Years old  Completed   HPV VACCINES  Aged Out    Health Maintenance  Health Maintenance Due  Topic Date Due   Hepatitis C Screening  Never done   Zoster Vaccines- Shingrix (1 of 2) Never done   DEXA SCAN  Never done   FOOT EXAM  04/23/2022   COVID-19 Vaccine (5 - 2024-25 season) 02/07/2023   OPHTHALMOLOGY EXAM  08/07/2023   Health Maintenance Items Addressed: 09/15/2023  Pt declines the Shingles and COVID vaccines.  Additional Screening:  Vision Screening: Recommended annual ophthalmology exams for early detection of glaucoma and other disorders of the eye.  Dental Screening: Recommended annual dental exams for proper oral hygiene  Community Resource Referral / Chronic Care Management: CRR required this visit?  No   CCM required this visit?  No     Plan:     I have personally reviewed and noted the following in the patient's chart:   Medical and social history Use of alcohol, tobacco  or illicit drugs  Current medications and supplements including opioid prescriptions. Patient is currently taking opioid prescriptions. Information provided to patient regarding non-opioid alternatives. Patient advised to discuss non-opioid treatment plan with their provider. Functional ability and status Nutritional status Physical activity Advanced directives List of other physicians Hospitalizations, surgeries, and ER visits in previous 12 months Vitals Screenings to include cognitive, depression, and falls Referrals and appointments  In addition, I have reviewed and discussed with patient certain preventive protocols, quality metrics, and best practice recommendations. A written personalized care plan for preventive services as well as general preventive health recommendations were provided to patient.     Darreld Mclean,  CMA   09/15/2023   After Visit Summary: (MyChart) Due to this being a telephonic visit, the after visit summary with patients personalized plan was offered to patient via MyChart   Notes:  Pt stated Endocrinologist, Dr Gershon Crane orders her DEXA scan for 2025.

## 2023-09-15 NOTE — Progress Notes (Signed)
 EPIC Encounter for ICM Monitoring  Patient Name: Kathleen [name] is a 78 y.o. female Date: 09/15/2023 Primary Care Physican: [name], MD Primary Cardiologist: Bensimhon Electrophysiologist: Camnitz Bi-V Pacing: 100%          03/16/2023 Weight: 190 lbs       03/23/2023 Weight: 188 lbs   04/06/2023 Weight: 176 lbs    06/30/2023 Weight: 178 lbs 08/02/2023 Weight: 172 lbs 09/15/2023 Weight: 177 lbs   Since 09-Sep-2023 Time in AT/AF  0.0 hr/day (0.0%)                                 Spoke with patient and heart failure questions reviewed.  Transmission results reviewed.  Pt asymptomatic for fluid accumulation.  Will advise pt to set up overdue EP office appointment and have device clinic arrange for Optivol to be re calibrated at that visit.   Diet:  She lives in an assisted living  Eats foods that are prepared at the facility she lives in which are not low salt.     Optivol thoracic impedance suggesting ongoing possible fluid accumulation but Dr Gala Romney suggests it is inaccurate due to patients weight loss and recent abdominal surgery.  ReDs 23% which suggesting dryness per Dr Gala Romney.    Prescribed:  Torsemide 20 mg take 2 tablets (40 mg total) by mouth daily (started 3/12) Potassium 10 mEq take 1 tablet by mouth three (3) times a day (started 2/28 by PCP) Spironolactone 25 mg take 0.5 (12.5 mg total) tablet twice a day   Labs: 08/03/2023 Creatinine 0.75, BUN 19, Potassium 3.3, Sodium 140, GFR 77.24 (Potassium started 2/28) 06/30/2023 Creatinine 0.69, BUN 16, Potassium 3.4, Sodium 142, GFR 84.25   05/27/2023 Creatinine 0.82, BUN 23, Potassium 4.5, Sodium 134, GFR >60  05/26/2023 Creatinine 0.57, BUN 23, Potassium 4.8, Sodium 136, GFR >60  05/24/2023 Creatinine 0.65, BUN 37, Potassium 3.8, Sodium 130, GFR >60  05/23/2023 Creatinine 0.62, BUN 37, Potassium 3.8, Sodium 132, GFR >60 05/22/2023 Creatinine 0.57, BUN 36, Potassium 4.1, Sodium 129, GFR >60  05/21/2023 Creatinine 0.54,  BUN 28, Potassium 3.9, Sodium 131, GFR >60  A complete set of results can be found in Results Review.   Recommendations:  No changes and encouraged to call if experiencing any fluid symptoms.   Follow-up plan: ICM clinic phone appointment on 10/18/2023.  91 day device clinic remote transmission 12/29/2023.     EP/Cardiology Office Visits:  01/10/2024 with HF Clinic.  Recall 05/13/2023 with Francis Dowse, PA or Otilio Saber, [place].     Copy of ICM check sent to Dr. Elberta Fortis.    3 month ICM trend: 09/14/2023.    12-14 Month ICM trend:     Karie Soda, RN 09/15/2023 9:02 AM

## 2023-09-15 NOTE — Patient Instructions (Addendum)
 [name] , Thank you for taking time to come for your Medicare Wellness Visit. I appreciate your ongoing commitment to your health goals. Please review the following plan we discussed and let me know if I can assist you in the future.   Referrals/Orders/Follow-Ups/Clinician Recommendations: Aim for 30 minutes of exercise or brisk walking, 6-8 glasses of water, and 5 servings of fruits and vegetables each day.   This is a list of the screening recommended for you and due dates:  Health Maintenance  Topic Date Due   Hepatitis C Screening  Never done   Zoster (Shingles) Vaccine (1 of 2) Never done   DEXA scan (bone density measurement)  Never done   Complete foot exam   04/23/2022   COVID-19 Vaccine (5 - 2024-25 season) 02/07/2023   Eye exam for diabetics  08/07/2023   DTaP/Tdap/Td vaccine (2 - Td or Tdap) 10/03/2023   Hemoglobin A1C  12/28/2023   Flu Shot  01/07/2024   Pneumonia Vaccine  Completed   HPV Vaccine  Aged Out    Advanced directives: (Copy Requested) Please bring a copy of your health care power of attorney and living will to the office to be added to your chart at your convenience. You can mail to [facility] [address] [place], [place] [address] or email to [email or web address]  Next Medicare Annual Wellness Visit scheduled for next year: Yes   Managing Pain Without Opioids Opioids are strong medicines used to treat moderate to severe pain. For some people, especially those who have long-term (chronic) pain, opioids may not be the best choice for pain management due to: Side effects like nausea, constipation, and sleepiness. The risk of addiction (opioid use disorder). The longer you take opioids, the greater your risk of addiction. Pain that lasts for more than 3 months is called chronic pain. Managing chronic pain usually requires more than one approach and is often provided by a team of health care providers working together (multidisciplinary  approach). Pain management may be done at a pain management center or pain clinic. How to manage pain without the use of opioids Use non-opioid medicines Non-opioid medicines for pain may include: Over-the-counter or prescription non-steroidal anti-inflammatory drugs (NSAIDs). These may be the first medicines used for pain. They work well for muscle and bone pain, and they reduce swelling. Acetaminophen. This over-the-counter medicine may work well for milder pain but not swelling. Antidepressants. These may be used to treat chronic pain. A certain type of antidepressant (tricyclics) is often used. These medicines are given in lower doses for pain than when used for depression. Anticonvulsants. These are usually used to treat seizures but may also reduce nerve (neuropathic) pain. Muscle relaxants. These relieve pain caused by sudden muscle tightening (spasms). You may also use a pain medicine that is applied to the skin as a patch, cream, or gel (topical analgesic), such as a numbing medicine. These may cause fewer side effects than medicines taken by mouth. Do certain therapies as directed Some therapies can help with pain management. They include: Physical therapy. You will do exercises to gain strength and flexibility. A physical therapist may teach you exercises to move and stretch parts of your body that are weak, stiff, or painful. You can learn these exercises at physical therapy visits and practice them at home. Physical therapy may also involve: Massage. Heat wraps or applying heat or cold to affected areas. Electrical signals that interrupt pain signals (transcutaneous electrical nerve stimulation, TENS). Weak lasers  that reduce pain and swelling (low-level laser therapy). Signals from your body that help you learn to regulate pain (biofeedback). Occupational therapy. This helps you to learn ways to function at home and work with less pain. Recreational therapy. This involves trying new  activities or hobbies, such as a physical activity or drawing. Mental health therapy, including: Cognitive behavioral therapy (CBT). This helps you learn coping skills for dealing with pain. Acceptance and commitment therapy (ACT) to change the way you think and react to pain. Relaxation therapies, including muscle relaxation exercises and mindfulness-based stress reduction. Pain management counseling. This may be individual, family, or group counseling.  Receive medical treatments Medical treatments for pain management include: Nerve block injections. These may include a pain blocker and anti-inflammatory medicines. You may have injections: Near the spine to relieve chronic back or neck pain. Into joints to relieve back or joint pain. Into nerve areas that supply a painful area to relieve body pain. Into muscles (trigger point injections) to relieve some painful muscle conditions. A medical device placed near your spine to help block pain signals and relieve nerve pain or chronic back pain (spinal cord stimulation device). Acupuncture. Follow these instructions at home Medicines Take over-the-counter and prescription medicines only as told by your health care provider. If you are taking pain medicine, ask your health care providers about possible side effects to watch out for. Do not drive or use heavy machinery while taking prescription opioid pain medicine. Lifestyle  Do not use drugs or alcohol to reduce pain. If you drink alcohol, limit how much you have to: 0-1 drink a day for women who are not pregnant. 0-2 drinks a day for men. Know how much alcohol is in a drink. In the [place] one drink equals one 12 oz bottle of beer (355 mL), one 5 oz glass of wine (148 mL), or one 1 oz glass of hard liquor (44 mL). Do not use any products that contain nicotine or tobacco. These products include cigarettes, chewing tobacco, and vaping devices, such as e-cigarettes. If you need help quitting, ask  your health care provider. Eat a healthy diet and maintain a healthy weight. Poor diet and excess weight may make pain worse. Eat foods that are high in fiber. These include fresh fruits and vegetables, whole grains, and beans. Limit foods that are high in fat and processed sugars, such as fried and sweet foods. Exercise regularly. Exercise lowers stress and may help relieve pain. Ask your health care provider what activities and exercises are safe for you. If your health care provider approves, join an exercise class that combines movement and stress reduction. Examples include yoga and tai chi. Get enough sleep. Lack of sleep may make pain worse. Lower stress as much as possible. Practice stress reduction techniques as told by your therapist. General instructions Work with all your pain management providers to find the treatments that work best for you. You are an important member of your pain management team. There are many things you can do to reduce pain on your own. Consider joining an online or in-person support group for people who have chronic pain. Keep all follow-up visits. This is important. Where to find more information You can find more information about managing pain without opioids from: American Academy of Pain Medicine: painmed.org Institute for Chronic Pain: instituteforchronicpain.org American Chronic Pain Association: theacpa.org Contact a health care provider if: You have side effects from pain medicine. Your pain gets worse or does not get better with treatments  or home therapy. You are struggling with anxiety or depression. Summary Many types of pain can be managed without opioids. Chronic pain may respond better to pain management without opioids. Pain is best managed when you and a team of health care providers work together. Pain management without opioids may include non-opioid medicines, medical treatments, physical therapy, mental health therapy, and lifestyle  changes. Tell your health care providers if your pain gets worse or is not being managed well enough. This information is not intended to replace advice given to you by your health care provider. Make sure you discuss any questions you have with your health care provider. Document Revised: 09/04/2020 Document Reviewed: 09/04/2020 Elsevier Patient Education  2024 [employer].

## 2023-09-16 ENCOUNTER — Ambulatory Visit: Payer: Medicare Other | Admitting: Internal Medicine

## 2023-09-16 NOTE — Telephone Encounter (Signed)
FYI.  Is this okay?

## 2023-09-17 ENCOUNTER — Emergency Department (HOSPITAL_BASED_OUTPATIENT_CLINIC_OR_DEPARTMENT_OTHER)
Admission: EM | Admit: 2023-09-17 | Discharge: 2023-09-17 | Disposition: A | Discharge status: Home / Self Care | Attending: Emergency Medicine | Admitting: Emergency Medicine

## 2023-09-17 ENCOUNTER — Encounter (HOSPITAL_BASED_OUTPATIENT_CLINIC_OR_DEPARTMENT_OTHER): Payer: Self-pay

## 2023-09-17 ENCOUNTER — Emergency Department (HOSPITAL_BASED_OUTPATIENT_CLINIC_OR_DEPARTMENT_OTHER): Admitting: Radiology

## 2023-09-17 ENCOUNTER — Other Ambulatory Visit: Payer: Self-pay

## 2023-09-17 DIAGNOSIS — J4489 Other specified chronic obstructive pulmonary disease: Secondary | ICD-10-CM | POA: Diagnosis not present

## 2023-09-17 DIAGNOSIS — Z7951 Long term (current) use of inhaled steroids: Secondary | ICD-10-CM | POA: Diagnosis not present

## 2023-09-17 DIAGNOSIS — Z7901 Long term (current) use of anticoagulants: Secondary | ICD-10-CM | POA: Diagnosis not present

## 2023-09-17 DIAGNOSIS — I509 Heart failure, unspecified: Secondary | ICD-10-CM | POA: Insufficient documentation

## 2023-09-17 DIAGNOSIS — N823 Fistula of vagina to large intestine: Secondary | ICD-10-CM | POA: Insufficient documentation

## 2023-09-17 DIAGNOSIS — I130 Hypertensive heart and chronic kidney disease with heart failure and stage 1 through stage 4 chronic kidney disease, or unspecified chronic kidney disease: Secondary | ICD-10-CM | POA: Insufficient documentation

## 2023-09-17 DIAGNOSIS — N939 Abnormal uterine and vaginal bleeding, unspecified: Secondary | ICD-10-CM | POA: Diagnosis present

## 2023-09-17 DIAGNOSIS — N824 Other female intestinal-genital tract fistulae: Secondary | ICD-10-CM

## 2023-09-17 DIAGNOSIS — I959 Hypotension, unspecified: Secondary | ICD-10-CM | POA: Diagnosis not present

## 2023-09-17 DIAGNOSIS — Z950 Presence of cardiac pacemaker: Secondary | ICD-10-CM | POA: Diagnosis not present

## 2023-09-17 DIAGNOSIS — N189 Chronic kidney disease, unspecified: Secondary | ICD-10-CM | POA: Diagnosis not present

## 2023-09-17 LAB — CBC WITH DIFFERENTIAL/PLATELET
Abs Immature Granulocytes: 0.03 10*3/uL (ref 0.00–0.07)
Basophils Absolute: 0.1 10*3/uL (ref 0.0–0.1)
Basophils Relative: 1 %
Eosinophils Absolute: 0.2 10*3/uL (ref 0.0–0.5)
Eosinophils Relative: 2 %
HCT: 37.3 % (ref 36.0–46.0)
Hemoglobin: 12 g/dL (ref 12.0–15.0)
Immature Granulocytes: 0 %
Lymphocytes Relative: 13 %
Lymphs Abs: 1.3 10*3/uL (ref 0.7–4.0)
MCH: 26.4 pg (ref 26.0–34.0)
MCHC: 32.2 g/dL (ref 30.0–36.0)
MCV: 82 fL (ref 80.0–100.0)
Monocytes Absolute: 0.7 10*3/uL (ref 0.1–1.0)
Monocytes Relative: 7 %
Neutro Abs: 7.4 10*3/uL (ref 1.7–7.7)
Neutrophils Relative %: 77 %
Platelets: 235 10*3/uL (ref 150–400)
RBC: 4.55 MIL/uL (ref 3.87–5.11)
RDW: 15.3 % (ref 11.5–15.5)
WBC: 9.8 10*3/uL (ref 4.0–10.5)
nRBC: 0 % (ref 0.0–0.2)

## 2023-09-17 LAB — COMPREHENSIVE METABOLIC PANEL WITH GFR
ALT: 8 U/L (ref 0–44)
AST: 14 U/L — ABNORMAL LOW (ref 15–41)
Albumin: 4.2 g/dL (ref 3.5–5.0)
Alkaline Phosphatase: 56 U/L (ref 38–126)
Anion gap: 10 (ref 5–15)
BUN: 23 mg/dL (ref 8–23)
CO2: 28 mmol/L (ref 22–32)
Calcium: 9.7 mg/dL (ref 8.9–10.3)
Chloride: 101 mmol/L (ref 98–111)
Creatinine, Ser: 1.17 mg/dL — ABNORMAL HIGH (ref 0.44–1.00)
GFR, Estimated: 48 mL/min — ABNORMAL LOW (ref >60)
Glucose, Bld: 111 mg/dL — ABNORMAL HIGH (ref 70–99)
Potassium: 3.7 mmol/L (ref 3.5–5.1)
Sodium: 139 mmol/L (ref 135–145)
Total Bilirubin: 0.6 mg/dL (ref 0.0–1.2)
Total Protein: 7 g/dL (ref 6.5–8.1)

## 2023-09-17 NOTE — Telephone Encounter (Signed)
 The weight increase is not significant

## 2023-09-17 NOTE — Discharge Instructions (Signed)
 Your kidney function looks slightly.  Hydrate yourself a little more.  Follow-up with your doctors.  Follow-up with Dr. Dwain Sarna for the mild amount of bleeding.

## 2023-09-17 NOTE — Telephone Encounter (Signed)
 yes

## 2023-09-17 NOTE — Telephone Encounter (Signed)
 This isn't a verbal order. My mistake this is just a weight check in and tommy wants to give you a heads up and to know if any weight changes need to be made.

## 2023-09-17 NOTE — Telephone Encounter (Signed)
 Orvilla Fus has been made aware of Dr. Yetta Barre comments and gave a verbal understanding. States that the patient's weight is still at 176

## 2023-09-17 NOTE — ED Triage Notes (Signed)
 Pt c/o hypotension, advises that home PT states BP went from 130's systolic to 100's systolic. Advises vaginal bleeding that has turned from dark to bright red. Endorses intermittent lightheaded feeling. "Major surgery- emergent colostomy 11/19" unsure if bleeding is r/t known fistula or otherwise.

## 2023-09-17 NOTE — Telephone Encounter (Signed)
 Can I give the verbal order?

## 2023-09-17 NOTE — ED Notes (Signed)
 RN reviewed discharge instructions with pt. Pt verbalized understanding and had no further questions

## 2023-09-17 NOTE — Telephone Encounter (Signed)
 Noted.

## 2023-09-17 NOTE — Telephone Encounter (Signed)
 Waiting on a call back from Kathleen [name].

## 2023-09-17 NOTE — ED Provider Notes (Signed)
 [place] EMERGENCY DEPARTMENT AT [facility] Provider Note   CSN: [id number] Arrival date & time: 09/17/23  1713     History  Chief Complaint  Patient presents with   Dizziness   Hypotension   Vaginal Bleeding    Kathleen [name] is a 78 y.o. female.   Dizziness Vaginal Bleeding Associated symptoms: dizziness   Patient with lightheadedness dizziness and some vaginal bleeding.  Blood pressures been a little low.  History of A-fib.  Also history of perforated diverticula with emergent surgery back in September.  Had prolonged rehab and he got out of the hospital and rehab and January.  Has a known fistula between colon and vagina.  Usually has some yellowish discharge but has been more darker brown and then some bloody today.  States PT told her to come in.  Is due to see Dr. Dwain Sarna early next month.  Also has been a little more short of breath.   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    Concussion 10/04/2021   COPD (chronic obstructive pulmonary disease) (HCC)    Diverticulitis    Gout    Hypertension    Longstanding persistent atrial fibrillation (HCC)    on Xarelto   Nonischemic cardiomyopathy (HCC)    Obesity    Pacemaker    Spontaneous pneumothorax 2013    Home Medications Prior to Admission medications   Medication Sig Start Date End Date Taking? Authorizing Provider  acetaminophen (TYLENOL) 500 MG tablet Take 1,000 mg by mouth as needed.    [provider]  albuterol (PROVENTIL) (2.5 MG/3ML) 0.083% nebulizer solution USE 3 ML VIA NEBULIZER THREE TIMES DAILY 06/28/23   [provider]  Cholecalciferol (VITAMIN D3) 50 MCG (2000 UT) TABS Take 2,000 Units by mouth at bedtime.    [provider]  docusate sodium (COLACE) 100 MG  capsule Take 100 mg by mouth daily.    [provider]  dofetilide (TIKOSYN) 250 MCG capsule Take 1 capsule (250 mcg total) by mouth in the morning and at bedtime. 07/23/23 07/22/24  Laurey Morale, MD  ELIQUIS 5 MG TABS tablet TAKE 1 TABLET(5 MG) BY MOUTH TWICE DAILY 08/26/23   [name], MD  febuxostat (ULORIC) 40 MG tablet Take 1 tablet (40 mg total) by mouth daily. 07/23/23   Fuller Plan, MD  fluticasone furoate-vilanterol (BREO ELLIPTA) 100-25 MCG/ACT AEPB INHALE 1 PUFF INTO THE LUNGS DAILY 03/23/23   Oretha Milch, MD  gabapentin (NEURONTIN) 100 MG capsule Take 2 capsules (200 mg total) by mouth at bedtime. 07/26/23 02/21/24  Windell Norfolk, MD  levalbuterol (XOPENEX) 0.63 MG/3ML nebulizer solution Take 3 mLs (0.63 mg total) by nebulization every 6 (six) hours as needed for wheezing or shortness of breath. DX J44.89 J96.21 01/20/23   Oretha Milch, MD  Magnesium 500 MG TABS Take 500 mg by mouth 2 (two) times daily.    [provider]  nystatin (MYCOSTATIN/NYSTOP) powder Apply topically 3 (three) times daily. 05/27/23   Leroy Sea, MD  oxyCODONE (OXY IR/ROXICODONE) 5 MG immediate release tablet Take 1-2 tablets (5-10 mg total) by mouth every 6 (six) hours as needed for moderate pain (pain score 4-6) or severe pain (pain score 7-10) (5mg  moderate, 10mg  severe). 05/27/23   Thedore Mins,  Stanford Scotland, MD  OXYGEN Inhale 4 L into the lungs continuous. Use with resmed ventilator    [provider]  potassium chloride (KLOR-CON) 10 MEQ tablet Take 1 tablet (10 mEq total) by mouth 3 (three) times daily. 08/06/23   [name], MD  revefenacin (YUPELRI) 175 MCG/3ML nebulizer solution Take 175 mcg by nebulization daily.    [provider]  spironolactone (ALDACTONE) 25 MG tablet Take 12.5 mg by mouth daily.    [provider]  torsemide (DEMADEX) 20 MG tablet Take 2 tablets (40 mg total) by mouth daily. 08/09/23   [name], MD  traMADol (ULTRAM)  50 MG tablet Take 1 tablet (50 mg total) by mouth every 6 (six) hours as needed for up to 5 days. 09/13/23 09/18/23  Amador Cunas, PA-C  triamcinolone cream (KENALOG) 0.1 % Apply 1 Application topically 2 (two) times daily. 07/02/23   Alfredia Ferguson, PA-C  Zinc Oxide (DESITIN MAXIMUM STRENGTH) 40 % PSTE Apply 1 Application topically 4 (four) times daily. Mix with aquaphor and apply to inflamed area    [provider]      Allergies    Allopurinol, Clindamycin, Flublok [influenza vaccine recombinant], Pneumococcal 13-val conj vacc, Dronedarone, Montelukast, Brovana [arformoterol], Budesonide, Entresto [sacubitril-valsartan], Fosamax [alendronate sodium], Jardiance [empagliflozin], Meperidine, Microplegia msa-msg [plegisol], Rosuvastatin, Tetracycline, Adhesive [tape], and Lovastatin    Review of Systems   Review of Systems  Genitourinary:  Positive for vaginal bleeding.  Neurological:  Positive for dizziness.    Physical Exam Updated Vital Signs BP (!) 126/52   Pulse 70   Temp 98 F (36.7 C)   Resp 16   SpO2 100%  Physical Exam Vitals and nursing note reviewed.  HENT:     Head: Normocephalic.  Abdominal:     Comments: Colostomy left abdomen.  Healing scar/wound on midline of lower abdomen.  Does have some dried blood in this area.  Skin:    General: Skin is warm.     Capillary Refill: Capillary refill takes less than 2 seconds.  Neurological:     Mental Status: She is alert and oriented to person, place, and time.     ED Results / Procedures / Treatments   Labs (all labs ordered are listed, but only abnormal results are displayed) Labs Reviewed  COMPREHENSIVE METABOLIC PANEL WITH GFR - Abnormal; Notable for the following components:      Result Value   Glucose, Bld 111 (*)    Creatinine, Ser 1.17 (*)    AST 14 (*)    GFR, Estimated 48 (*)    All other components within normal limits  CBC WITH DIFFERENTIAL/PLATELET    EKG None  Radiology DG Abdomen  Acute W/Chest Result Date: 09/17/2023 CLINICAL DATA:  Shortness of breath with abdominal pain. EXAM: DG ABDOMEN ACUTE WITH 1 VIEW CHEST COMPARISON:  None Available. FINDINGS: There is gaseous distention of the stomach with air-fluid level. Otherwise, there is no evidence of dilated bowel loops or free intraperitoneal air. No radiopaque calculi or other significant radiographic abnormality is seen. Cholecystectomy clips are present. The none heart is enlarged. Left-sided ICD again noted. Both lungs are clear. IMPRESSION: Gaseous distention of the stomach with air-fluid level. Otherwise, no evidence of dilated bowel loops or free intraperitoneal air. Electronically Signed   By: Darliss Cheney M.D.   On: 09/17/2023 20:16    Procedures Procedures    Medications Ordered in ED Medications - No data to display  ED Course/ Medical Decision Making/ A&P  Medical Decision Making Amount and/or Complexity of Data Reviewed Labs: ordered. Radiology: ordered.   Patient with abdominal pain.  Has had previous abscess and perforated diverticulitis.  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  Has had some bleeding.  Hemoglobin reassuring as is white count.  Creatinine mildly increased.  Will get x-ray and do pelvic exam.  Reviewed surgery notes.  X-ray reassuring.  Pelvic exam showed some mild mucousy discharge but no blood.  Disc with patient and she will increase her fluid intake little at home and follow-up with her doctors.  Appears stable for discharge home.       Final Clinical Impression(s) / ED Diagnoses Final diagnoses:  Colovaginal fistula    Rx / DC Orders ED Discharge Orders     None         Benjiman Core, MD 09/17/23 2327

## 2023-09-17 NOTE — Telephone Encounter (Signed)
 Spoke with the patient she states that she would rather her ortho doctor to handle this issue and she would give [place] a call if she needed [place].

## 2023-09-20 ENCOUNTER — Telehealth: Payer: Self-pay | Admitting: Internal Medicine

## 2023-09-20 NOTE — Telephone Encounter (Signed)
 Copied from CRM [id number]. Topic: General - Other >> Sep 20, 2023 12:06 PM Annelle Kiel wrote: Reason for CRM: kristy from [facility] is requesting a nurse go back out to patient and check her surgical wound Ira Mann stated he seems like a nurse need to go back out and check on her patient wound

## 2023-09-21 NOTE — Telephone Encounter (Signed)
 Spoke with the patient and she states that she would like to wait for the Surgeon to see her wound.

## 2023-09-23 ENCOUNTER — Other Ambulatory Visit (HOSPITAL_COMMUNITY): Payer: Self-pay | Admitting: Cardiology

## 2023-09-23 ENCOUNTER — Encounter: Payer: Self-pay | Admitting: Neurology

## 2023-09-23 ENCOUNTER — Ambulatory Visit (INDEPENDENT_AMBULATORY_CARE_PROVIDER_SITE_OTHER): Payer: Medicare Other | Admitting: Neurology

## 2023-09-23 VITALS — BP 132/84 | HR 67 | Ht 61.0 in | Wt 177.0 lb

## 2023-09-23 DIAGNOSIS — R7989 Other specified abnormal findings of blood chemistry: Secondary | ICD-10-CM

## 2023-09-23 DIAGNOSIS — E538 Deficiency of other specified B group vitamins: Secondary | ICD-10-CM | POA: Diagnosis not present

## 2023-09-23 DIAGNOSIS — G629 Polyneuropathy, unspecified: Secondary | ICD-10-CM

## 2023-09-23 DIAGNOSIS — E039 Hypothyroidism, unspecified: Secondary | ICD-10-CM | POA: Diagnosis not present

## 2023-09-23 MED ORDER — GABAPENTIN 300 MG PO CAPS: 300.0000 mg | ORAL_CAPSULE | Freq: Every day | ORAL | 3 refills | Status: AC

## 2023-09-23 MED ORDER — VITAMIN B-12 1000 MCG PO TABS: 1000.0000 ug | ORAL_TABLET | Freq: Every day | ORAL | 1 refills | Status: DC

## 2023-09-23 MED ORDER — ELIQUIS 5 MG PO TABS: 5.0000 mg | ORAL_TABLET | Freq: Two times a day (BID) | ORAL | 11 refills | Status: AC

## 2023-09-23 NOTE — Patient Instructions (Addendum)
 Increase Gabapentin to 300 mg nightly  Continue your other medications  Continue to follow up with your doctors  Will check thyroid and B12 level (history of B12 deficiency) Will check UA at patient request  Return as needed

## 2023-09-23 NOTE — Progress Notes (Signed)
 [facility]  PATIENT: Kathleen [name] DOB: 1945/09/10  REQUESTING CLINICIAN: [name], MD HISTORY FROM: Patient  REASON FOR VISIT: Fall, post traumatic headaches, post concussive syndrome    HISTORICAL  CHIEF COMPLAINT:  Chief Complaint  Patient presents with   New Patient (Initial Visit)    Rm12, alone, Post concussive syndrome Chronic tension-type headache: 0/30 days/triggers: pain. Dizziness: orthostatic bp completed by sit/standx2 also gets nausea,  Peripheral polyneuropathy:bilateral feet, feels like she has really thick socks on   INTERVAL HISTORY 09/23/2023:  Patient presents today for follow-up, last visit was a year ago, since then she is doing well in terms of headaches, has not had any headaches. Her neuropathy is controlled with the gabapentin, still feels tingling and numbness in the evening, described it as wearing socks even though she is not wearing it, denies any pain.  Her main problem is related to her GI tract.  She has been in and out of the hospital diagnosed with diverticulitis, colonic perforation, and had extensive abdominal surgery and now has a colostomy bag.  She tells me after surgery she was in nursing home, was not doing much, she was not exercising and son had to send her to a inpatient rehab, completed inpatient rehab, had to learn how walk again, she continues to do rehab at home and hope to continue for more weeks.  Denies any recent fall.  She is compliant with all of her medications.   Still having joints pain including bilateral shoulders, bilateral hips and knees, she is set up to see her orthopedic surgeon in a couple weeks.  Feels like she is depressed from her recent surgery, does not like to read, watch movies, does not like to do much as previously. She also reports fistula and does not think she can do another surgery.     INTERVAL HISTORY 09/17/2022:  Patient presents today for follow-up, last visit was 6 months  ago.  At that time we started her on amitriptyline, she states she could not tolerate the medication, was making her very sleepy and loopy.  She self discontinued.  In terms of the headache she has been doing better, actually headaches improved and the last episode was a month and a half ago.  Currently she is complaining of occasional dizziness.  She reports while in bed if she turned her head to the right she will have less than a minute dizziness.  These episodes are less severe than her previous vertigo that she experienced which required Epley maneuver.  No falls due to the dizziness.  She is also complaining of bilateral feet numbness that she describes as feeling like she is wearing socks when in fact she is not wearing socks.  She denies any pain. She is doing PT but only for shoulder pain    HISTORY OF PRESENT ILLNESS:  This is a 78 year old woman past medical history of COPD, atrial fibrillation on anticoagulation and CHF who is presenting for postconcussive syndrome.  Patient reports having a fall back in April.  Patient states she was sitting on her rollator, she got up and lost control of the walker then she fell and and hit her head on a concrete block.  She is not sure how long she was out but she had severe headaches then.  She was taken to the ED and the initial head CT showed subarachnoid hemorrhage.  Since then she had experienced initially severe vertigo and severe headache.  She has completed vestibular  therapy, vertigo is resolved but she still having periods of lightheadedness that she described as unsteadiness.  In terms of the headaches she still having headaches, on average 4 headaches per week, headaches are described as throbbing pain and sometimes bandlike pain that is across her head.  Currently she has not been taking any medication for headaches. She reports on September 21, again in her apartment she has episode of unsteadiness but luckily for her she fell on her bed. Since  the accident, she is having difficulty following instructions, she has back pain for which she takes tramadol nightly, she has blurry vision after reading 30 minutes and then she has to stop and also complain of decreased hearing on the right side.  She does report low energy.  Currently she is not doing physical therapy at her assisted living facility, she complaints of low energy but she does walk from her room to the diner.    OTHER MEDICAL CONDITIONS: COPD, Atrial fibrillation, CHF   REVIEW OF SYSTEMS: Full 14 system review of systems performed and negative with exception of: As noted in the HPI   ALLERGIES: Allergies  Allergen Reactions   Allopurinol Other (See Comments)    Reaction:  Dizziness    Clindamycin Anaphylaxis and Hives   Flublok [Influenza Vaccine Recombinant] Other (See Comments)    Fever 103 with no alternative explanation day after vaccine.  Clydie Braun Highfill FNP-C   Pneumococcal 13-Val Conj Vacc Itching, Swelling and Rash   Dronedarone Rash   Montelukast Other (See Comments)    Makes her loopy.   Brovana [Arformoterol]     Caused muscle pain   Budesonide     Caused extreme joint pain   Entresto [Sacubitril-Valsartan] Other (See Comments)    hypotension   Fosamax [Alendronate Sodium] Nausea Only   Jardiance [Empagliflozin]     Caused a vaginal infection   Meperidine Nausea And Vomiting   Microplegia Msa-Msg [Plegisol]     Loopy,diarrhea   Rosuvastatin Other (See Comments)    Reaction:  Muscle spasms    Tetracycline Hives   Adhesive [Tape] Rash   Lovastatin Rash and Other (See Comments)    Muscle Pain    HOME MEDICATIONS: Outpatient Medications Prior to Visit  Medication Sig Dispense Refill   acetaminophen (TYLENOL) 500 MG tablet Take 1,000 mg by mouth as needed.     Cholecalciferol (VITAMIN D3) 50 MCG (2000 UT) TABS Take 2,000 Units by mouth at bedtime.     docusate sodium (COLACE) 100 MG capsule Take 100 mg by mouth daily.     dofetilide (TIKOSYN) 250  MCG capsule Take 1 capsule (250 mcg total) by mouth in the morning and at bedtime. 60 capsule 6   febuxostat (ULORIC) 40 MG tablet Take 1 tablet (40 mg total) by mouth daily. 90 tablet 0   fluticasone furoate-vilanterol (BREO ELLIPTA) 100-25 MCG/ACT AEPB INHALE 1 PUFF INTO THE LUNGS DAILY 60 each 11   levalbuterol (XOPENEX) 0.63 MG/3ML nebulizer solution Take 3 mLs (0.63 mg total) by nebulization every 6 (six) hours as needed for wheezing or shortness of breath. DX J44.89 J96.21 3 mL 12   Magnesium 500 MG TABS Take 500 mg by mouth 2 (two) times daily.     nystatin (MYCOSTATIN/NYSTOP) powder Apply topically 3 (three) times daily.     oxyCODONE (OXY IR/ROXICODONE) 5 MG immediate release tablet Take 1-2 tablets (5-10 mg total) by mouth every 6 (six) hours as needed for moderate pain (pain score 4-6) or severe pain (pain score  7-10) (5mg  moderate, 10mg  severe). 4 tablet 0   OXYGEN Inhale 4 L into the lungs continuous. Use with resmed ventilator     potassium chloride (KLOR-CON) 10 MEQ tablet Take 1 tablet (10 mEq total) by mouth 3 (three) times daily. 270 tablet 0   revefenacin (YUPELRI) 175 MCG/3ML nebulizer solution Take 175 mcg by nebulization daily.     spironolactone (ALDACTONE) 25 MG tablet Take 12.5 mg by mouth daily.     torsemide (DEMADEX) 20 MG tablet Take 2 tablets (40 mg total) by mouth daily. 60 tablet 0   triamcinolone cream (KENALOG) 0.1 % Apply 1 Application topically 2 (two) times daily. 45 g 0   Zinc Oxide (DESITIN MAXIMUM STRENGTH) 40 % PSTE Apply 1 Application topically 4 (four) times daily. Mix with aquaphor and apply to inflamed area     ELIQUIS 5 MG TABS tablet TAKE 1 TABLET(5 MG) BY MOUTH TWICE DAILY 60 tablet 0   gabapentin (NEURONTIN) 100 MG capsule Take 2 capsules (200 mg total) by mouth at bedtime. 60 capsule 6   albuterol (PROVENTIL) (2.5 MG/3ML) 0.083% nebulizer solution USE 3 ML VIA NEBULIZER THREE TIMES DAILY     No facility-administered medications prior to visit.     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    Concussion 10/04/2021   COPD (chronic obstructive pulmonary disease) (HCC)    Diverticulitis    Gout    Hypertension    Longstanding persistent atrial fibrillation (HCC)    on Xarelto   Nonischemic cardiomyopathy (HCC)    Obesity    Pacemaker    Spontaneous pneumothorax 2013    PAST SURGICAL HISTORY: Past Surgical History:  Procedure Laterality Date   ABDOMINAL HYSTERECTOMY     APPENDECTOMY     APPLICATION OF WOUND VAC  04/27/2023   Procedure: APPLICATION OF WOUND VAC;  Surgeon: Emelia Loron, MD;  Location: [facility] OR;  Service: General;;   ATRIAL FIBRILLATION ABLATION  07/20/2013   by Dr Christin Fudge   AV nodal ablation  11/01/2013   by Dr Christin Fudge, repeated by Dr Wilford Grist   BREAST BIOPSY Bilateral 1997   negative   CARDIAC CATHETERIZATION     CHOLECYSTECTOMY     COLON RESECTION SIGMOID N/A 04/27/2023   Procedure: COLON RESECTION SIGMOID WITH COLOSTOMY;  Surgeon: Emelia Loron, MD;  Location: [facility] OR;  Service: General;  Laterality: N/A;   ESOPHAGOGASTRODUODENOSCOPY (EGD) WITH PROPOFOL N/A 05/24/2023   Procedure: ESOPHAGOGASTRODUODENOSCOPY (EGD) WITH PROPOFOL;  Surgeon: Beverley Fiedler, MD;  Location: [facility] ENDOSCOPY;  Service: Gastroenterology;  Laterality: N/A;   HERNIA REPAIR     LAPAROTOMY N/A 04/27/2023   Procedure: EXPLORATORY LAPAROTOMY;  Surgeon: Emelia Loron, MD;  Location: [facility] OR;  Service: General;  Laterality: N/A;   PACEMAKER INSERTION  06/2017   MDT Viva CRT-P implanted by Dr Christin Fudge after AV nodal ablation,  LV lead could not be placed   PACEMAKER INSERTION  05/2020   with lead bundle   RIGHT/LEFT HEART CATH AND CORONARY ANGIOGRAPHY N/A 03/20/2022   Procedure: RIGHT/LEFT HEART CATH AND CORONARY  ANGIOGRAPHY;  Surgeon: Swazil[place], Peter M, MD;  Location: [facility] INVASIVE CV LAB;  Service: Cardiovascular;  Laterality: N/A;    FAMILY HISTORY: Family History  Problem Relation Age of Onset   Breast cancer Mother  42       3 different times   Pancreatic cancer Mother    Emphysema Father    Healthy Brother    Breast cancer Cousin    Valvular heart disease Son    Obesity Daughter    Colon cancer Neg Hx    Esophageal cancer Neg Hx    Stomach cancer Neg Hx    Inflammatory bowel disease Neg Hx    Liver disease Neg Hx    Rectal cancer Neg Hx     SOCIAL HISTORY: Social History   Socioeconomic History   Marital status: Widowed    Spouse name: Not on file   Number of children: 1   Years of education: Not on file   Highest education level: Not on file  Occupational History   Occupation: retired  Tobacco Use   Smoking status: Former    Current packs/day: 0.00    Average packs/day: 1 pack/day for 20.0 years (20.0 ttl pk-yrs)    Types: Cigarettes    Start date: 06/09/1967    Quit date: 06/09/1987    Years since quitting: 36.3    Passive exposure: Past   Smokeless tobacco: Never   Tobacco comments:    Former smoker 12/25/21  Vaping Use   Vaping status: Never Used  Substance and Sexual Activity   Alcohol use: No    Alcohol/week: 0.0 standard drinks of alcohol   Drug use: No   Sexual activity: Not Currently  Other Topics Concern   Not on file  Social History Narrative   Pt lives in [place] alone.  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  Her son works in [employer].   Social Drivers of Corporate investment banker Strain: Low Risk  (09/15/2023)   Overall Financial Resource Strain (CARDIA)    Difficulty of Paying Living Expenses: Not hard at all  Food Insecurity: No Food Insecurity (09/15/2023)   Hunger Vital Sign    Worried About Running Out of Food in the Last Year: Never true    Ran Out of Food in the Last Year: Never true  Transportation Needs: No Transportation  Needs (09/15/2023)   PRAPARE - Administrator, Civil Service (Medical): No    Lack of Transportation (Non-Medical): No  Physical Activity: Insufficiently Active (09/15/2023)   Exercise Vital Sign    Days of Exercise per Week: 2 days    Minutes of Exercise per Session: 60 min  Stress: Stress Concern Present (09/15/2023)   [employer] of Occupational Health - Occupational Stress Questionnaire    Feeling of Stress : To some extent  Social Connections: Socially Isolated (09/15/2023)   Social Connection and Isolation Panel [NHANES]    Frequency of Communication with Friends and Family: More than three times a week    Frequency of Social Gatherings with Friends and Family: Never    Attends Religious Services: Never    Database administrator or Organizations: No    Attends Banker Meetings: Never    Marital Status: Widowed  Intimate Partner Violence: Not At Risk (09/15/2023)   Humiliation, Afraid, Rape, and Kick questionnaire    Fear of Current or Ex-Partner: No    Emotionally Abused: No    Physically Abused: No    Sexually Abused: No    PHYSICAL EXAM   GENERAL EXAM/CONSTITUTIONAL: Vitals:  Vitals:   09/23/23 1019 09/23/23 1020 09/23/23 1021  BP: 125/82 130/84 132/84  Pulse: 67 67 67  SpO2: 100%  100% 100%  Weight: 177 lb (80.3 kg)    Height: 5\' 1"  (1.549 m)      Body mass index is 33.44 kg/m. Wt Readings from Last 3 Encounters:  09/23/23 177 lb (80.3 kg)  09/15/23 177 lb (80.3 kg)  09/09/23 177 lb 9.6 oz (80.6 kg)   Patient is in no distress; well developed, nourished and groomed; neck is supple, She uses supplemental oxygen for her COPD  MUSCULOSKELETAL: Gait, strength, tone, movements noted in Neurologic exam below  NEUROLOGIC: MENTAL STATUS:      No data to display         awake, alert, oriented to person, place and time recent and remote memory intact normal attention and concentration language fluent, comprehension intact, naming  intact fund of knowledge appropriate  CRANIAL NERVE:  2nd, 3rd, 4th, 6th - visual fields full to confrontation, extraocular muscles intact, no nystagmus 5th - facial sensation symmetric 7th - facial strength symmetric 8th - hearing intact 9th - palate elevates symmetrically, uvula midline 11th - shoulder shrug symmetric 12th - tongue protrusion midline  MOTOR:  normal bulk and tone, at least antigravity in the BUE and BLE but limited by pain  SENSORY:  normal and symmetric to light touch  COORDINATION:  finger-nose-finger, fine finger movements normal  GAIT/STATION:  Walk with a rollator   DIAGNOSTIC DATA (LABS, IMAGING, TESTING) - I reviewed patient records, labs, notes, testing and imaging myself where available.  Lab Results  Component Value Date   WBC 9.8 09/17/2023   HGB 12.0 09/17/2023   HCT 37.3 09/17/2023   MCV 82.0 09/17/2023   PLT 235 09/17/2023      Component Value Date/Time   NA 139 09/17/2023 1726   NA 130 (L) 12/28/2012 0748   K 3.7 09/17/2023 1726   K 4.6 12/28/2012 0748   CL 101 09/17/2023 1726   CL 95 (L) 12/28/2012 0748   CO2 28 09/17/2023 1726   CO2 23 12/28/2012 0748   GLUCOSE 111 (H) 09/17/2023 1726   GLUCOSE 391 (H) 12/28/2012 0748   BUN 23 09/17/2023 1726   BUN 32 (H) 12/28/2012 0748   CREATININE 1.17 (H) 09/17/2023 1726   CREATININE 0.86 01/27/2023 1137   CALCIUM 9.7 09/17/2023 1726   CALCIUM 14.3 (HH) 06/26/2019 1618   PROT 7.0 09/17/2023 1726   ALBUMIN 4.2 09/17/2023 1726   AST 14 (L) 09/17/2023 1726   ALT 8 09/17/2023 1726   ALKPHOS 56 09/17/2023 1726   BILITOT 0.6 09/17/2023 1726   GFRNONAA 48 (L) 09/17/2023 1726   GFRNONAA 43 (L) 12/28/2012 0748   GFRAA 58 (L) 06/28/2019 0427   GFRAA 50 (L) 12/28/2012 0748   Lab Results  Component Value Date   CHOL 174 08/07/2021   HDL 35.00 (L) 08/07/2021   LDLCALC 99 08/07/2021   TRIG 145 05/24/2023   CHOLHDL 5 08/07/2021   Lab Results  Component Value Date   HGBA1C 5.9  06/30/2023   Lab Results  Component Value Date   VITAMINB12 535 03/28/2023   Lab Results  Component Value Date   TSH 7.41 (H) 06/30/2023    Head CT 10/04/21 There are small foci of subarachnoid hemorrhage in the left parietal lobe. There is no epidural or subdural hematoma. Atrophy. There is subcutaneous hematoma in the right parietal scalp   ASSESSMENT AND PLAN  78 y.o. year old female with COPD, CHF, atrial fibrillation on anticoagulation who is presenting for follow-up for postconcussive syndrome.  In terms of the headaches, she is  doing well, no additional headaches. Her neuropathy is controlled but still having symptoms in the evening. Will increase her Gabapentin to 300 mg nightly. I will do labs today and advised patient to follow up with PCP. I will see her in a year or sooner if worse.    1. Hypothyroidism, unspecified type   2. Vitamin B12 deficiency   3. Peripheral polyneuropathy   4. Abnormal thyroid blood test      Patient Instructions  Increase Gabapentin to 300 mg nightly  Continue your other medications  Continue to follow up with your doctors  Will check thyroid and B12 level (history of B12 deficiency) Will check UA at patient request  Return as needed   Orders Placed This Encounter  Procedures   Thyroid Panel With TSH   Urinalysis   Vitamin B12    Meds ordered this encounter  Medications   cyanocobalamin (VITAMIN B12) 1000 MCG tablet    Sig: Take 1 tablet (1,000 mcg total) by mouth daily.    Dispense:  90 tablet    Refill:  1   gabapentin (NEURONTIN) 300 MG capsule    Sig: Take 1 capsule (300 mg total) by mouth at bedtime.    Dispense:  90 capsule    Refill:  3    Return if symptoms worsen or fail to improve.  I have spent a total of 60 minutes dedicated to this patient today, preparing to see patient, performing a medically appropriate examination and evaluation, ordering tests and/or medications and procedures, and counseling and educating  the patient/family/caregiver; independently interpreting result and communicating results to the family/patient/caregiver; and documenting clinical information in the electronic medical record.   Cassandra Cleveland, MD 09/23/2023, 4:19 PM  [facility] [address], [address] [place], [place] [address] [phone]

## 2023-09-24 LAB — URINALYSIS
Bilirubin, UA: NEGATIVE
Glucose, UA: NEGATIVE
Ketones, UA: NEGATIVE
Nitrite, UA: NEGATIVE
Protein,UA: NEGATIVE
RBC, UA: NEGATIVE
Specific Gravity, UA: 1.01 (ref 1.005–1.030)
Urobilinogen, Ur: 0.2 mg/dL (ref 0.2–1.0)
pH, UA: 6.5 (ref 5.0–7.5)

## 2023-09-24 LAB — THYROID PANEL WITH TSH
Free Thyroxine Index: 2.2 (ref 1.2–4.9)
T3 Uptake Ratio: 29 % (ref 24–39)
T4, Total: 7.5 ug/dL (ref 4.5–12.0)
TSH: 4.61 u[IU]/mL — ABNORMAL HIGH (ref 0.450–4.500)

## 2023-09-24 LAB — VITAMIN B12: Vitamin B-12: 301 pg/mL (ref 232–1245)

## 2023-09-28 NOTE — Progress Notes (Signed)
 Office Visit Note  Patient: Kathleen [name]             Date of Birth: Sep 07, 1945           MRN: [id number]             PCP: Arcadio Knuckles, MD Referring: Arcadio Knuckles, MD Visit Date: 10/06/2023   Subjective:  Follow-up (Patient states she just got two shots in her back last Thursday for the hip pain. Patient states she is supposed to get a wrist injection today. )   Discussed the use of AI scribe software for clinical note transcription with the patient, who gave verbal consent to proceed.  History of Present Illness   Kathleen [name] is a 78 y.o. female here for follow up for chronic gout on Uloric  40 mg daily.  She is also suffering from bilateral carpal tunnel syndrome and osteoarthritis who presents with worsening pain and functional limitations.  She experiences persistent and worsening pain in her right hip and both shoulders, describing the shoulder pain as 'bone on bone'. There are significant limitations in her range of motion, particularly in her ability to lift her arms. She has difficulty performing daily activities such as lifting heavy dishes and washing her hair. The pain is severe enough to disrupt her sleep, and turning over in bed is particularly painful.  Unfortunately she had complicated medical events with her hospitalization and emergent abdominal surgeries leading to a greater than 1 month long hospitalization.  She just had recent injections for bilateral SI joint pain but feels this is very helpful in the left not yet helping much on the right side.  She has a history of gout but reports no recent flare-ups. She has been taking uloric  for gout management. Her uric acid levels were slightly elevated in December, but she has not experienced any gout attacks since her emergency colostomy surgery in November.  She has bilateral carpal tunnel syndrome, confirmed by a nerve conduction study. She experiences significant hand weakness and frequent dropping of objects,  particularly with her left hand. She describes her hand as 'jerking' and notes atrophy in her hand muscles. She has been taking oxycodone  5 mg at night for pain, but finds it inconsistently effective. She reports numbness in her fingers and attributes her carpal tunnel syndrome to prolonged computer use during her career.  She also mentions a history of osteoarthritis, which she believes affects multiple joints. She experiences significant pain in her hips and shoulders, which limits her mobility and ability to perform daily tasks. She has been using her left arm more due to limitations, which has led to increased pain and dysfunction in her shoulder.  Her social history includes a past occupation involving extensive computer use, which she believes contributed to her carpal tunnel syndrome. She enjoys painting but has been unable to engage in this activity due to her hand and shoulder limitations.       Previous HPI 01/27/2023 Kathleen [name] is a 78 y.o. female here for follow up for chronic gout on Uloric  40 mg daily.  Gout is doing well overall with no new definite flareup since her last visit has been only 1 in the past year.  Unfortunately has had ongoing joint pain at multiple areas.  Especially in her neck and back and shoulders.  Seeing Dr. Vaughn Georges for this had steroid injection at the right subacromial joint which was beneficial.  Also started formal physical therapy for deficits due to  rotator cuff tear and has improved her extension and abduction range of motion to slightly better than horizontal.  Has lot of trigger point and muscle knot problems in the upper back these are sometimes worse after doing shoulder exercises. Recent medical events with exacerbation of COPD leading to a hospitalization earlier this month.  Before that she was sick with worsening respiratory symptoms from a pneumonia that did not resolve for several weeks.  X-ray from July 15 did not show evidence of pneumonia.   X-ray at hospital admission on August 7 also with clear airspace but with new pleural effusion.  She was not seeing any visible increase in lower extremity swelling.  Lab testing on discharge showed decrease in total protein level. Also has ongoing trouble with intermittent numbness and dropping items unintentionally more in the right hand.  Reports previous history of carpal tunnel syndrome but reasonably well-controlled based on previous orthopedics note.  Never had any local intervention on this. Also has pain in her left foot currently after she struck her third toe on a counter corner walking at home.  There was extensive bruising and swelling immediately afterwards but this has mostly improved after 4 weeks.  She initially treated with buddy taping the second and third toe together. Since then just wearing some shoes with a looser toe box.   Previous HPI 07/23/2022 Kathleen [name] is a 78 y.o. female here for follow up for chronic gout on Uloric  40 mg daily.  She has not had any new flareups since her last visit.  Recently had some additional adjustments to her diuretic medication due to increase in volume overload and oxygen  requirement.  For about the past 3 weeks is also having increase in left shoulder pain with decreased mobility.  This is limiting her quite a bit since the right shoulder has little use due to full-thickness tendon tears for which she is not a surgical candidate to repair due to severe cardiopulmonary disease.  Less severe is having pain in bilateral hips this frequently bothers her lying in bed at night or if she is seated in fixed position for more than about half an hour.   Previous HPI 01/19/22 Kathleen [name] is a 78 y.o. female here for evaluation and management of chronic gout.  She has had gouty arthritis ongoing for a few years now most often affecting the right foot.  She saw a rheumatology provider in [place] for management more recently just being treated with her  primary care office.  She has previously taken allopurinol stopped due to concern about allergic reaction.  She was on maintenance colchicine  for flare prevention but had stopped this due to lack of efficacy.  More recently was prescribed colchicine  during severe flareup of pain in her left hand with no obvious clinical response.  She was treated with probenecid apparently stopped due to poor clinical response, not sure if this was uric acid or other parameters.  More recently appears to have been treated with uloric  since 2020 but with increased frequency and duration of gout flares in the past year. She was admitted to the hospital in December apparently with sepsis secondary to some chest wall cellulitis.  During his hospital stay she developed right and then the left-sided foot swelling and severe pain.  She also had a recurrence of the right foot symptoms within a few days after hospital discharge.  This was treated with prednisone  with clinical improvement.  Symptoms remained okay with her feet but in June  developed sudden left fourth PIP joint pain swelling and erythema.  This was thought to represent a new gout flare and treated with oral colchicine .  Symptoms are ongoing for about a month before resolving on their own she did not see any difference when taking the medicine.  She has been taking her subluxes at 40 mg daily consistently throughout this time.  Right now joints are pretty much all back to baseline.   Review of Systems  Constitutional:  Positive for fatigue.  HENT:  Positive for mouth dryness. Negative for mouth sores.   Eyes:  Negative for dryness.  Respiratory:  Positive for shortness of breath.   Cardiovascular:  Positive for chest pain. Negative for palpitations.  Gastrointestinal:  Negative for blood in stool, constipation and diarrhea.  Endocrine: Negative for increased urination.  Genitourinary:  Negative for involuntary urination.  Musculoskeletal:  Positive for joint pain,  gait problem, joint pain, myalgias, muscle tenderness and myalgias. Negative for joint swelling, muscle weakness and morning stiffness.  Skin:  Positive for hair loss. Negative for color change, rash and sensitivity to sunlight.  Allergic/Immunologic: Positive for susceptible to infections.  Neurological:  Positive for dizziness. Negative for headaches.  Hematological:  Negative for swollen glands.  Psychiatric/Behavioral:  Positive for depressed mood and sleep disturbance. The patient is nervous/anxious.     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   Carpal tunnel syndrome, right upper limb 01/27/2023   COPD with acute exacerbation (HCC) 10/09/2022   Chronic obstructive pulmonary disease (HCC) 06/28/2022   Palliative care encounter 06/28/2022   Anticoagulated by anticoagulation treatment 06/11/2022   Supplemental oxygen  dependent 06/11/2022   Heart failure with reduced ejection fraction (HCC) 05/14/2022   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   Depression, major, single episode, moderate (HCC) 04/27/2022   Obesity (BMI 30-39.9) 03/18/2022   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   Low TSH level 12/02/2021   Acquired hypothyroidism 11/12/2021   Encounter for palliative care involving management of pain 11/12/2021   Chronic left-sided low back pain 11/04/2021   Gout 10/01/2021   Hyperlipidemia with target LDL less than 100 08/07/2021    Chronic anticoagulation 07/07/2021   Stage 3a chronic kidney disease (HCC) 05/23/2021   Dietary iron deficiency 05/23/2021   Age-related osteoporosis without current pathological fracture 04/22/2021   Type 2 diabetes mellitus with diabetic neuropathy, without long-term current use of insulin  (HCC) 04/22/2021   Sleep apnea 01/22/2021   Physical deconditioning 01/16/2020   Statin intolerance 10/12/2019   Statin myopathy 02/24/2019   Chronic combined systolic and diastolic heart failure (HCC) 04/06/2018   HTN (hypertension) 04/06/2018   COPD (chronic obstructive pulmonary disease) (HCC) 04/06/2018   Persistent atrial fibrillation (HCC) 04/06/2018   Chronic respiratory failure with hypoxia (HCC) 03/28/2018   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 12/02/2015   Protein-calorie malnutrition, moderate (HCC) 09/11/2015    Past Medical History:  Diagnosis Date   Asthma    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HCC)    CHF (  congestive heart failure) (HCC)    Chronic renal insufficiency    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    Concussion 10/04/2021   COPD (chronic obstructive pulmonary disease) (HCC)    Diverticulitis    Gout    Hypertension    Longstanding persistent atrial fibrillation (HCC)    on Xarelto    Nonischemic cardiomyopathy (HCC)    Obesity    Pacemaker    Spontaneous pneumothorax 2013    Family History  Problem Relation Age of Onset   Breast cancer Mother 8       3 different times   Pancreatic cancer Mother    Emphysema Father    Healthy Brother    Breast cancer Cousin    Valvular heart disease Son    Obesity Daughter    Colon cancer Neg Hx    Esophageal cancer Neg Hx    Stomach cancer Neg Hx    Inflammatory bowel disease Neg Hx    Liver disease Neg Hx    Rectal cancer Neg Hx    Past Surgical History:  Procedure Laterality Date   ABDOMINAL HYSTERECTOMY     APPENDECTOMY     APPLICATION OF WOUND VAC   04/27/2023   Procedure: APPLICATION OF WOUND VAC;  Surgeon: Enid Harry, MD;  Location: [facility] OR;  Service: General;;   ATRIAL FIBRILLATION ABLATION  07/20/2013   by Dr Ginny Lair   AV nodal ablation  11/01/2013   by Dr Ginny Lair, repeated by Dr Alvena Jo   BREAST BIOPSY Bilateral 1997   negative   CARDIAC CATHETERIZATION     CHOLECYSTECTOMY     COLON RESECTION SIGMOID N/A 04/27/2023   Procedure: COLON RESECTION SIGMOID WITH COLOSTOMY;  Surgeon: Enid Harry, MD;  Location: [facility] OR;  Service: General;  Laterality: N/A;   ESOPHAGOGASTRODUODENOSCOPY (EGD) WITH PROPOFOL  N/A 05/24/2023   Procedure: ESOPHAGOGASTRODUODENOSCOPY (EGD) WITH PROPOFOL ;  Surgeon: Nannette Babe, MD;  Location: [facility] ENDOSCOPY;  Service: Gastroenterology;  Laterality: N/A;   HERNIA REPAIR     LAPAROTOMY N/A 04/27/2023   Procedure: EXPLORATORY LAPAROTOMY;  Surgeon: Enid Harry, MD;  Location: [facility] OR;  Service: General;  Laterality: N/A;   PACEMAKER INSERTION  06/2017   MDT Viva CRT-P implanted by Dr Ginny Lair after AV nodal ablation,  LV lead could not be placed   PACEMAKER INSERTION  05/2020   with lead bundle   RIGHT/LEFT HEART CATH AND CORONARY ANGIOGRAPHY N/A 03/20/2022   Procedure: RIGHT/LEFT HEART CATH AND CORONARY ANGIOGRAPHY;  Surgeon: Swaz[place], Peter M, MD;  Location: [facility] INVASIVE CV LAB;  Service: Cardiovascular;  Laterality: N/A;   Social History   Social History Narrative   Pt lives in [place] alone.  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  Her son works in [employer].   Immunization History  Administered Date(s) Administered   Fluad Quad(high Dose 65+) 04/22/2021, 03/16/2022   Hepatitis A, Adult 04/12/2001   Influenza, High Dose Seasonal PF 03/16/2019   PFIZER Comirnaty(Gray Top)Covid-19 Tri-Sucrose Vaccine 03/29/2020, 10/09/2020   PFIZER(Purple Top)SARS-COV-2 Vaccination 07/13/2019, 08/08/2019   Pneumococcal Conjugate-13 08/30/2014   Pneumococcal Polysaccharide-23 03/31/2019   Tdap  10/02/2013   Yellow Fever 04/12/2001     Objective: Vital Signs: BP 112/71 (BP Location: Left Arm, Patient Position: Sitting, Cuff Size: Normal)   Pulse 73   Resp 16   Ht 5' (1.524 m)   Wt 174 lb (78.9 kg)   BMI 33.98 kg/m    Physical Exam Constitutional:      Appearance:  She is obese.  Cardiovascular:     Rate and Rhythm: Normal rate and regular rhythm.  Pulmonary:     Effort: Pulmonary effort is normal.     Breath sounds: Normal breath sounds.  Musculoskeletal:     Right lower leg: No edema.     Left lower leg: No edema.  Skin:    Findings: No rash.  Neurological:     Mental Status: She is alert.      Musculoskeletal Exam:  Right shoulder abduction significantly restricted with pain also passive range of motion limitation, left shoulder is overall intact Good wrist flexion and extension range of motion with tenderness to percussion Squaring of first CMC joint, Heberden's nodes, thenar muscle atrophy  Investigation: No additional findings.  Imaging: CUP PACEART REMOTE DEVICE CHECK Result Date: 09/30/2023 PPM Scheduled remote reviewed. Normal device function.  Presenting rhythm: AP/BiV pace 1 NSVT on previous ICM transmission, V>A HR 231, 8 beats in duraiton HF diagnostics currently abnormal Next remote 91 days. LA, CVRS  DG HIP UNILAT WITH PELVIS MIN 4 VIEWS LEFT Result Date: 09/21/2023 CLINICAL DATA:  Pain.  Bilateral hip pain. EXAM: DG HIP (WITH OR WITHOUT PELVIS) 4+V RIGHT; DG HIP (WITH OR WITHOUT PELVIS) 4+V LEFT COMPARISON:  Radiograph 10/05/2021, CT 10/06/2021 FINDINGS: Right hip: The hip joint spaces preserved. Slight acetabular spurring. Femoral head is well seated. No fracture. No erosion or evidence of avascular necrosis. Left hip: Slight hip joint space narrowing with acetabular spurring. Femoral head is well seated. No erosion or evidence of avascular necrosis. Pelvis: Intact bony pelvis. Pubic rami are intact. Pubic symphysis is congruent. There is minor  degenerative change of both sacroiliac joints. No evidence of focal bone abnormality. IMPRESSION: 1. Mild osteoarthritis of both hips. 2. Minor degenerative change of both sacroiliac joints. Electronically Signed   By: Chadwick Colonel M.D.   On: 09/21/2023 10:46   DG HIP UNILAT WITH PELVIS MIN 4 VIEWS RIGHT Result Date: 09/21/2023 CLINICAL DATA:  Pain.  Bilateral hip pain. EXAM: DG HIP (WITH OR WITHOUT PELVIS) 4+V RIGHT; DG HIP (WITH OR WITHOUT PELVIS) 4+V LEFT COMPARISON:  Radiograph 10/05/2021, CT 10/06/2021 FINDINGS: Right hip: The hip joint spaces preserved. Slight acetabular spurring. Femoral head is well seated. No fracture. No erosion or evidence of avascular necrosis. Left hip: Slight hip joint space narrowing with acetabular spurring. Femoral head is well seated. No erosion or evidence of avascular necrosis. Pelvis: Intact bony pelvis. Pubic rami are intact. Pubic symphysis is congruent. There is minor degenerative change of both sacroiliac joints. No evidence of focal bone abnormality. IMPRESSION: 1. Mild osteoarthritis of both hips. 2. Minor degenerative change of both sacroiliac joints. Electronically Signed   By: Chadwick Colonel M.D.   On: 09/21/2023 10:46   DG Lumbar Spine Complete Result Date: 09/21/2023 CLINICAL DATA:  Pain. EXAM: LUMBAR SPINE - COMPLETE 4+ VIEW COMPARISON:  Lumbar MRI 10/07/2021, radiograph 10/05/2021 FINDINGS: AP, lateral neutral, lateral flexion and lateral extension views obtained. 5 non-rib-bearing lumbar vertebra. Mild broad-based levo scoliotic curvature. 6 mm anterolisthesis of L4 on L5 is unchanged on flexion and extension. Slight progression from prior exam. L4-L5 disc space narrowing, progressed from prior exam. Mild L3-L4, moderate at L4-L5 and L5-S1 facet hypertrophy. No fracture or compression deformity. No visible pars defects or focal bone abnormalities. IMPRESSION: 1. Chronic grade 1 anterolisthesis of L4 on L5 without instability. 2. L4-L5 degenerative disc  disease, progressed from 2023. 3. Lower lumbar facet hypertrophy. Electronically Signed   By: Alvina Axon.D.  On: 09/21/2023 10:44   DG Abdomen Acute W/Chest Result Date: 09/17/2023 CLINICAL DATA:  Shortness of breath with abdominal pain. EXAM: DG ABDOMEN ACUTE WITH 1 VIEW CHEST COMPARISON:  None Available. FINDINGS: There is gaseous distention of the stomach with air-fluid level. Otherwise, there is no evidence of dilated bowel loops or free intraperitoneal air. No radiopaque calculi or other significant radiographic abnormality is seen. Cholecystectomy clips are present. The none heart is enlarged. Left-sided ICD again noted. Both lungs are clear. IMPRESSION: Gaseous distention of the stomach with air-fluid level. Otherwise, no evidence of dilated bowel loops or free intraperitoneal air. Electronically Signed   By: Tyron Gallon M.D.   On: 09/17/2023 20:16    Recent Labs: Lab Results  Component Value Date   WBC 9.8 09/17/2023   HGB 12.0 09/17/2023   PLT 235 09/17/2023   NA 139 09/17/2023   K 3.7 09/17/2023   CL 101 09/17/2023   CO2 28 09/17/2023   GLUCOSE 111 (H) 09/17/2023   BUN 23 09/17/2023   CREATININE 1.17 (H) 09/17/2023   BILITOT 0.6 09/17/2023   ALKPHOS 56 09/17/2023   AST 14 (L) 09/17/2023   ALT 8 09/17/2023   PROT 7.0 09/17/2023   ALBUMIN  4.2 09/17/2023   CALCIUM  9.7 09/17/2023   GFRAA 58 (L) 06/28/2019    Speciality Comments: No specialty comments available.  Procedures:  Hand/UE Inj: L carpal tunnel for carpal tunnel syndrome on 10/06/2023 4:10 PM Indications: therapeutic and pain Details: 25 G needle, ultrasound-guided radial approach Medications: 1 mL lidocaine  1 %; 40 mg triamcinolone  acetonide 40 MG/ML Outcome: tolerated well, no immediate complications Procedure, treatment alternatives, risks and benefits explained, specific risks discussed. Consent was given by the patient. Immediately prior to procedure a time out was called to verify the correct  patient, procedure, equipment, support staff and site/side marked as required. Patient was prepped and draped in the usual sterile fashion.     Allergies: Allopurinol, Clindamycin, Flublok [influenza vaccine recombinant], Pneumococcal 13-val conj vacc, Dronedarone, Montelukast , Brovana  [arformoterol ], Budesonide , Entresto  [sacubitril -valsartan ], Fosamax [alendronate sodium], Jardiance  [empagliflozin ], Meperidine, Microplegia msa-msg [plegisol], Rosuvastatin, Tetracycline, Adhesive [tape], and Lovastatin   Assessment / Plan:     Visit Diagnoses: Acute idiopathic gout of left hand - 05/24/2023 Uric Acid 8.6 - Plan: febuxostat  (ULORIC ) 40 MG tablet Gout with no recent flare-ups. Elevated uric acid levels in December. Currently on Uloric  (febuxostat ) with no recent symptoms. - Continue Uloric  (febuxostat ) 40 mg daily - Monitor uric acid levels and kidney function as needed.   Carpal tunnel syndrome, right upper limb  Carpal tunnel syndrome of left wrist - Plan: Hand/UE Inj: L carpal tunnel Bilateral carpal tunnel syndrome with moderately severe impingement confirmed by nerve conduction study. Left hand more symptomatic. Recent injections beneficial. - Administer corticosteroid injection to the left wrist using ultrasound guidance. - Evaluate response to injection and consider future injection for the right wrist if beneficial.  Osteoarthritis of shoulders and hips Chronic osteoarthritis in shoulders and hips with significant pain and functional limitation. Right hip and both shoulders particularly affected. Current oxycodone  regimen inconsistent in relief. - Continue oxycodone  5 mg as needed, primarily at night. - Consider alternative pain management strategies if current regimen remains ineffective.    Orders: Orders Placed This Encounter  Procedures   Hand/UE Inj: L carpal tunnel   Hand/UE Inj   Meds ordered this encounter  Medications   febuxostat  (ULORIC ) 40 MG tablet    Sig: Take 1  tablet (40 mg total) by mouth daily.  Dispense:  90 tablet    Refill:  1     Follow-Up Instructions: Return in about 3 months (around 01/05/2024) for Gout on uloric /CTS inj f/u 3mos.   Matt Song, MD  Note - This record has been created using [employer].  Chart creation errors have been sought, but may not always  have been located. Such creation errors do not reflect on  the standard of medical care.

## 2023-09-29 ENCOUNTER — Ambulatory Visit (INDEPENDENT_AMBULATORY_CARE_PROVIDER_SITE_OTHER): Payer: Medicare Other

## 2023-09-29 ENCOUNTER — Encounter: Payer: Self-pay | Admitting: Neurology

## 2023-09-29 ENCOUNTER — Other Ambulatory Visit: Payer: Self-pay | Admitting: Neurology

## 2023-09-29 DIAGNOSIS — I442 Atrioventricular block, complete: Secondary | ICD-10-CM

## 2023-09-29 MED ORDER — VITAMIN B-12 1000 MCG PO TABS: 1000.0000 ug | ORAL_TABLET | Freq: Every day | ORAL | 1 refills | Status: DC

## 2023-09-30 ENCOUNTER — Encounter: Payer: Self-pay | Admitting: Sports Medicine

## 2023-09-30 ENCOUNTER — Ambulatory Visit: Admitting: Sports Medicine

## 2023-09-30 ENCOUNTER — Other Ambulatory Visit: Payer: Self-pay

## 2023-09-30 DIAGNOSIS — M461 Sacroiliitis, not elsewhere classified: Secondary | ICD-10-CM | POA: Diagnosis not present

## 2023-09-30 DIAGNOSIS — R5381 Other malaise: Secondary | ICD-10-CM | POA: Diagnosis not present

## 2023-09-30 DIAGNOSIS — G8929 Other chronic pain: Secondary | ICD-10-CM

## 2023-09-30 DIAGNOSIS — M19011 Primary osteoarthritis, right shoulder: Secondary | ICD-10-CM

## 2023-09-30 DIAGNOSIS — M533 Sacrococcygeal disorders, not elsewhere classified: Secondary | ICD-10-CM

## 2023-09-30 DIAGNOSIS — M7918 Myalgia, other site: Secondary | ICD-10-CM | POA: Diagnosis not present

## 2023-09-30 DIAGNOSIS — M19012 Primary osteoarthritis, left shoulder: Secondary | ICD-10-CM

## 2023-09-30 LAB — CUP PACEART REMOTE DEVICE CHECK
Battery Remaining Longevity: 77 mo
Battery Voltage: 2.97 V
Brady Statistic AP VP Percent: 84.41 %
Brady Statistic AP VS Percent: 0.01 %
Brady Statistic AS VP Percent: 15.57 %
Brady Statistic AS VS Percent: 0 %
Brady Statistic RA Percent Paced: 84.35 %
Brady Statistic RV Percent Paced: 99.98 %
Date Time Interrogation Session: 20250423023143
Implantable Lead Connection Status: 753985
Implantable Lead Connection Status: 753985
Implantable Lead Implant Date: 20150527
Implantable Lead Implant Date: 20211221
Implantable Lead Location: 753858
Implantable Lead Location: 753860
Implantable Lead Model: 3830
Implantable Lead Model: 5076
Implantable Pulse Generator Implant Date: 20211221
Lead Channel Impedance Value: 304 Ohm
Lead Channel Impedance Value: 323 Ohm
Lead Channel Impedance Value: 342 Ohm
Lead Channel Impedance Value: 361 Ohm
Lead Channel Impedance Value: 418 Ohm
Lead Channel Impedance Value: 437 Ohm
Lead Channel Impedance Value: 437 Ohm
Lead Channel Impedance Value: 475 Ohm
Lead Channel Impedance Value: 551 Ohm
Lead Channel Pacing Threshold Amplitude: 0.625 V
Lead Channel Pacing Threshold Amplitude: 0.75 V
Lead Channel Pacing Threshold Amplitude: 1 V
Lead Channel Pacing Threshold Pulse Width: 0.4 ms
Lead Channel Pacing Threshold Pulse Width: 0.4 ms
Lead Channel Pacing Threshold Pulse Width: 0.4 ms
Lead Channel Sensing Intrinsic Amplitude: 1.125 mV
Lead Channel Sensing Intrinsic Amplitude: 1.125 mV
Lead Channel Sensing Intrinsic Amplitude: 6.25 mV
Lead Channel Sensing Intrinsic Amplitude: 6.25 mV
Lead Channel Setting Pacing Amplitude: 1.25 V
Lead Channel Setting Pacing Amplitude: 1.5 V
Lead Channel Setting Pacing Amplitude: 2 V
Lead Channel Setting Pacing Pulse Width: 0.4 ms
Lead Channel Setting Pacing Pulse Width: 0.4 ms
Lead Channel Setting Sensing Sensitivity: 2 mV
Zone Setting Status: 755011

## 2023-09-30 NOTE — Progress Notes (Signed)
 GERILYN STARGELL - 78 y.o. female MRN [id number]  Date of birth: [birth year]  Office Visit Note: Visit Date: 09/30/2023 PCP: Arcadio Knuckles, MD Referred by: Amedeo Jupiter, PA*  Subjective: Chief Complaint  Patient presents with   Lower Back - Pain   HPI: OCIE STANZIONE is a pleasant 78 y.o. female who presents today for acute on chronic low back pain with SI joint dysfunction.  Clide Dalton has unfortunately been through a lot medically in which she was in the hospital for 35 days as well as then transferred to short-term rehab, currently she was residing in independent living.  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  She certainly had physical deconditioning being bedridden for so long.  Her low back has been bothering her.  She was seen by my partner Sharrell Deck at [facility] location who did image her hips and her low back, which I reviewed both today, we thought some of her pain was emanating from her SI joints. Her current medications include oxycodone  or tramadol  50 mg, which she takes at night to help with sleep, though it does not completely alleviate the pain.   She still is having limited mobility and pain of the right greater than left shoulder as well.  Pertinent ROS were reviewed with the patient and found to be negative unless otherwise specified above in HPI.   Assessment & Plan: Visit Diagnoses:  1. Chronic left SI joint pain   2. Chronic right SI joint pain   3. SI joint arthritis (HCC)   4. Bilateral buttock pain   5. Physical deconditioning   6. Primary osteoarthritis of shoulders, bilateral    Plan: Impression is bilateral low back pain with SI joint arthritis with radiation into bilateral buttocks which is multifactorial.  X-rays do show a degree of SI joint arthritis as well as lumbar DDD.  I do think this was exacerbated from her prolonged immobility given her  prolonged hospital stay.  She has advanced through transitional care with rehab and currently residing in independent living.  Through shared decision making we did proceed with ultrasound-guided right and left SI joint injection, patient tolerated well.  Advised on postinjection protocol.  We will see over the course of the next few weeks the degree of relief this is her.  Sharrell Deck did mention if for some reason she does not receive good relief, he would like to see her back for further evaluation of the low back.  She does have bilateral shoulder pain but her right shoulder is becoming quite bothersome and limiting her activities of daily living, cooking, etc.  I would like her to see Dr. Rozelle Corning for further evaluation of this for any help in terms of treatment he could provide.  She likely is not a surgical candidate given her medical comorbidities.  She may use either her oxycodone  or her tramadol  50 mg for breakthrough pain as needed.  I did tell her she should not take both as this would be taking 2 opioids and would certainly worsen her respiratory depression.  Follow-up: Return for make appt see Dr. Rozelle Corning for shoulder OA (R > L) .   Meds & Orders: No orders of the defined types were placed in this encounter.   Orders Placed This Encounter  Procedures   US  Guided Needle Placement - No Linked Charges     Procedures: U/S-guided SI-joint injection, Right   After  discussion of risk/benefits/indications, informed verbal consent was obtained. A timeout was then performed. The patient was positioned in a prone position on exam room table with a pillow placed under the pelvis for mild hip flexion. The SI joint area was cleaned and prepped with betadine and alcohol swabs. Sterile ultrasound gel was applied and the ultrasound transducer was placed in an anatomic axial plane over the PSIS, then moved distally over the SI-joint. Using ultrasound guidance, a 22-gauge, 3.5" needle was inserted from a  medial to lateral approach utilizing an in-plane approach and directed into the SI-joint. The SI-joint was then injected with a mixture of 4:1 lidocaine :depomedrol with visualization of the injectate flow into the SI-joint under ultrasound visualization. The patient tolerated the procedure well without immediate complications.  U/S-guided SI-joint injection, Left   After discussion of risk/benefits/indications, informed verbal consent was obtained. A timeout was then performed. The patient was positioned in a prone position on exam room table with a pillow placed under the pelvis for mild hip flexion. The SI joint area was cleaned and prepped with betadine and alcohol swabs. Sterile ultrasound gel was applied and the ultrasound transducer was placed in an anatomic axial plane over the PSIS, then moved distally over the SI-joint. Using ultrasound guidance, a 22-gauge, 3.5" needle was inserted from a medial to lateral approach utilizing an in-plane approach and directed into the SI-joint. The SI-joint was then injected with a mixture of 4:1 lidocaine :depomedrol with visualization of the injectate flow into the SI-joint under ultrasound visualization. The patient tolerated the procedure well without immediate complications.       Clinical History: No specialty comments available.  She reports that she quit smoking about 36 years ago. Her smoking use included cigarettes. She started smoking about 56 years ago. She has a 20 pack-year smoking history. She has been exposed to tobacco smoke. She has never used smokeless tobacco.  Recent Labs    01/27/23 1137 02/22/23 2013 04/23/23 2314 05/24/23 0334 06/30/23 1122  HGBA1C  --  5.5 6.2*  --  5.9  LABURIC 7.3*  --   --  8.6*  --     Objective:    Physical Exam  Gen: Well-appearing, in no acute distress; non-toxic CV: Well-perfused. Warm.  Resp: Breathing unlabored on room air; no wheezing. Psych: Fluid speech in conversation; appropriate affect;  normal thought process  Ortho Exam - Lumbar/SI joints: + TTP over bilateral posterior SI joints just inferior to the PSIS.  Positive Fortin's point test and SI joint compression.  There is somewhat limited range of motion of the lumbar spine with flexion and extension.  Patient ambulates with the assistance of a walker.  Imaging:  *Independent review and interpretation of three-view right and left hip x-ray and lumbar spine from 09/13/2023 was performed by myself today.  AP pelvis does show   Narrative & Impression  CLINICAL DATA:  Pain.  Bilateral hip pain.   EXAM: DG HIP (WITH OR WITHOUT PELVIS) 4+V RIGHT; DG HIP (WITH OR WITHOUT PELVIS) 4+V LEFT   COMPARISON:  Radiograph 10/05/2021, CT 10/06/2021   FINDINGS: Right hip: The hip joint spaces preserved. Slight acetabular spurring. Femoral head is well seated. No fracture. No erosion or evidence of avascular necrosis.   Left hip: Slight hip joint space narrowing with acetabular spurring. Femoral head is well seated. No erosion or evidence of avascular necrosis.   Pelvis: Intact bony pelvis. Pubic rami are intact. Pubic symphysis is congruent. There is minor degenerative change of both sacroiliac joints.  No evidence of focal bone abnormality.   IMPRESSION: 1. Mild osteoarthritis of both hips. 2. Minor degenerative change of both sacroiliac joints.     Electronically Signed   By: Chadwick Colonel M.D.   On: 09/21/2023 10:46   Narrative & Impression  CLINICAL DATA:  Pain.   EXAM: LUMBAR SPINE - COMPLETE 4+ VIEW   COMPARISON:  Lumbar MRI 10/07/2021, radiograph 10/05/2021   FINDINGS: AP, lateral neutral, lateral flexion and lateral extension views obtained. 5 non-rib-bearing lumbar vertebra. Mild broad-based levo scoliotic curvature. 6 mm anterolisthesis of L4 on L5 is unchanged on flexion and extension. Slight progression from prior exam. L4-L5 disc space narrowing, progressed from prior exam. Mild L3-L4, moderate at  L4-L5 and L5-S1 facet hypertrophy. No fracture or compression deformity. No visible pars defects or focal bone abnormalities.   IMPRESSION: 1. Chronic grade 1 anterolisthesis of L4 on L5 without instability. 2. L4-L5 degenerative disc disease, progressed from 2023. 3. Lower lumbar facet hypertrophy.     Electronically Signed   By: Chadwick Colonel M.D.   On: 09/21/2023 10:44    Past Medical/Family/Surgical/Social History: Medications & Allergies reviewed per EMR, new medications updated. 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   Carpal tunnel syndrome, right upper limb 01/27/2023   COPD with acute exacerbation (HCC) 10/09/2022   Chronic obstructive pulmonary disease (HCC) 06/28/2022   Palliative care encounter 06/28/2022   Anticoagulated by anticoagulation treatment 06/11/2022   Supplemental oxygen  dependent 06/11/2022   Heart failure with reduced ejection fraction (HCC) 05/14/2022   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   Depression, major, single episode, moderate (HCC) 04/27/2022   Obesity (BMI 30-39.9) 03/18/2022   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   Low TSH level 12/02/2021   Acquired hypothyroidism 11/12/2021   Encounter for palliative care involving management of pain 11/12/2021   Chronic left-sided low back pain 11/04/2021   Gout 10/01/2021   Hyperlipidemia with target LDL less than 100 08/07/2021   Chronic anticoagulation  07/07/2021   Stage 3a chronic kidney disease (HCC) 05/23/2021   Dietary iron deficiency 05/23/2021   Age-related osteoporosis without current pathological fracture 04/22/2021   Type 2 diabetes mellitus with diabetic neuropathy, without long-term current use of insulin  (HCC) 04/22/2021   Sleep apnea 01/22/2021   Physical deconditioning 01/16/2020   Statin intolerance 10/12/2019   Statin myopathy 02/24/2019   Chronic combined systolic and diastolic heart failure (HCC) 04/06/2018   HTN (hypertension) 04/06/2018   COPD (chronic obstructive pulmonary disease) (HCC) 04/06/2018   Persistent atrial fibrillation (HCC) 04/06/2018   Chronic respiratory failure with hypoxia (HCC) 03/28/2018   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 12/02/2015   Protein-calorie malnutrition, moderate (HCC) 09/11/2015   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    Concussion 10/04/2021   COPD (chronic obstructive pulmonary disease) (  HCC)    Diverticulitis    Gout    Hypertension    Longstanding persistent atrial fibrillation (HCC)    on Xarelto    Nonischemic cardiomyopathy (HCC)    Obesity    Pacemaker    Spontaneous pneumothorax 2013   Family History  Problem Relation Age of Onset   Breast cancer Mother 20       3 different times   Pancreatic cancer Mother    Emphysema Father    Healthy Brother    Breast cancer Cousin    Valvular heart disease Son    Obesity Daughter    Colon cancer Neg Hx    Esophageal cancer Neg Hx    Stomach cancer Neg Hx    Inflammatory bowel disease Neg Hx    Liver disease Neg Hx    Rectal cancer Neg Hx    Past Surgical History:  Procedure Laterality Date   ABDOMINAL HYSTERECTOMY     APPENDECTOMY     APPLICATION OF WOUND VAC  04/27/2023   Procedure:  APPLICATION OF WOUND VAC;  Surgeon: Enid Harry, MD;  Location[facility] OR;  Service: General;;   ATRIAL FIBRILLATION ABLATION  07/20/2013   by Dr Ginny Lair   AV nodal ablation  11/01/2013   by Dr Ginny Lair, repeated by Dr Alvena Jo   BREAST BIOPSY Bilateral 1997   negative   CARDIAC CATHETERIZATION     CHOLECYSTECTOMY     COLON RESECTION SIGMOID N/A 04/27/2023   Procedure: COLON RESECTION SIGMOID WITH COLOSTOMY;  Surgeon: Enid Harry, MD;  Location: [facility] OR;  Service: General;  Laterality: N/A;   ESOPHAGOGASTRODUODENOSCOPY (EGD) WITH PROPOFOL  N/A 05/24/2023   Procedure: ESOPHAGOGASTRODUODENOSCOPY (EGD) WITH PROPOFOL ;  Surgeon: Nannette Babe, MD;  Location: [facility] ENDOSCOPY;  Service: Gastroenterology;  Laterality: N/A;   HERNIA REPAIR     LAPAROTOMY N/A 04/27/2023   Procedure: EXPLORATORY LAPAROTOMY;  Surgeon: Enid Harry, MD;  Location: [facility] OR;  Service: General;  Laterality: N/A;   PACEMAKER INSERTION  06/2017   MDT Viva CRT-P implanted by Dr Ginny Lair after AV nodal ablation,  LV lead could not be placed   PACEMAKER INSERTION  05/2020   with lead bundle   RIGHT/LEFT HEART CATH AND CORONARY ANGIOGRAPHY N/A 03/20/2022   Procedure: RIGHT/LEFT HEART CATH AND CORONARY ANGIOGRAPHY;  Surgeon: [place], Peter M, MD;  Location: [facility] INVASIVE CV LAB;  Service: Cardiovascular;  Laterality: N/A;   Social History   Occupational History   Occupation: retired  Tobacco Use   Smoking status: Former    Current packs/day: 0.00    Average packs/day: 1 pack/day for 20.0 years (20.0 ttl pk-yrs)    Types: Cigarettes    Start date: 06/09/1967    Quit date: 06/09/1987    Years since quitting: 36.3    Passive exposure: Past   Smokeless tobacco: Never   Tobacco comments:    Former smoker 12/25/21  Vaping Use   Vaping status: Never Used  Substance and Sexual Activity   Alcohol use: No    Alcohol/week: 0.0 standard drinks of alcohol   Drug use: No   Sexual activity: Not Currently

## 2023-10-01 ENCOUNTER — Telehealth: Payer: Self-pay

## 2023-10-01 NOTE — Telephone Encounter (Signed)
 Copied from CRM [id number]. Topic: Clinical - Medical Advice >> Oct 01, 2023  1:37 PM Adaysia C wrote: Reason for CRM: Charise with [facility] called to inform the provider they discharged the patient from physical therapy home health services last week; patient scored an 11 on the depression scale which is considered moderately >> Oct 01, 2023  1:43 PM Adaysia C wrote: Please follow up with Charise(PT [facility]) if necessary [phone]

## 2023-10-04 NOTE — Progress Notes (Addendum)
 Electrophysiology Office Note:   Date:  10/07/2023  ID:  Kathleen [name], DOB Sep 06, 1945, MRN [id number]  Primary Cardiologist: Peder Bourdon, MD Primary Heart Failure: None Electrophysiologist: Will Cortland Ding, MD       History of Present Illness:   Kathleen [name] is a 78 y.o. female with h/o AF, combined systolic/diastolic HF s/p CRT-P, HTN, BOOP, COPD, OSA, DM II seen today for routine electrophysiology followup.   Prolonged hospitalization 11/14-12/19/2024 for diverticulitis associated colovaginal fistula requiring ex-lap, sigmoid colectomy and end ostomy.  Post operative course was prolonged in setting if ileus with associated malnutrition and significant weight loss.   Since last being seen in our clinic the patient reports doing very well after her hospitalization. She notes that her device seems more prominent and she has some associated discomfort on the left lateral border of the device. She has not missed doses of Tikosyn  or Eliquis .   She denies chest pain, palpitations, dyspnea, PND, orthopnea, nausea, vomiting, dizziness, syncope, edema, weight gain, or early satiety.   Review of systems complete and found to be negative unless listed in HPI.    EP Information / Studies Reviewed:    EKG is ordered today. Personal review as below.  EKG Interpretation Date/Time:  Thursday Oct 07 2023 14:08:58 EDT Ventricular Rate:  71 PR Interval:  208 QRS Duration:  128 QT Interval:  432 QTC Calculation: 469 R Axis:   -53  Text Interpretation: AV dual-paced rhythm Confirmed by Creighton Doffing ([id number]) on 10/07/2023 2:38:21 PM   PPM Interrogation-  reviewed in detail today,  See PACEART report.  Device History: Medtronic BiV CRT-P implanted 11/01/13 with LV lead added 05/28/20 for CHB / combined HF Dependent   Risk Assessment/Calculations:    CHA2DS2-VASc Score = 7   This indicates a 11.2% annual risk of stroke. The patient's score is based upon: CHF History: 1 HTN History:  1 Diabetes History: 1 Stroke History: 0 Vascular Disease History: 1 Age Score: 2 Gender Score: 1            Physical Exam:   VS:  BP (!) 140/68 (BP Location: Right Arm, Patient Position: Sitting, Cuff Size: Large)   Pulse 71   Ht 5' (1.524 m)   Wt 175 lb 6.4 oz (79.6 kg)   SpO2 95%   BMI 34.26 kg/m    Wt Readings from Last 3 Encounters:  10/07/23 175 lb 6.4 oz (79.6 kg)  10/06/23 174 lb (78.9 kg)  09/23/23 177 lb (80.3 kg)     GEN: Well nourished, well developed in no acute distress NECK: No JVD; No carotid bruits CARDIAC: Regular rate and rhythm, no murmurs, rubs, gallops RESPIRATORY:  Clear to auscultation without rales, wheezing or rhonchi  ABDOMEN: Soft, non-tender, non-distended EXTREMITIES:  No edema; No deformity   ASSESSMENT AND PLAN:    CHB s/p Medtronic CRT-P -Normal PPM function -See Pace Art report -No changes today -OptiVol re-calibrated per Dr. Andria Keeler request (pt has lost >50 lbs in setting of GI surgery)  Atrial Fibrillation  High Risk Drug Monitoring: Dofetilide   CHA2DS2-VASc 7 -0% burden on device  -EKG with NSR, stable intervals    -continue Tikosyn  250mcg BID  -Tikosyn  labs > BMP, Mg+   -OAC for stroke prophylaxis   Secondary Hypercoagulable State  -continue Eliquis  5mg  BID, dose reviewed and appropriate by age / wt   Disposition:   Follow up with Dr. Lawana Pray or EP APP in 6 months for device. Follow up in AF Clinic in 4  months for Tikosyn  monitoring.  Signed, Creighton Doffing, NP-C, AGACNP-BC [facility] - Electrophysiology  10/07/2023, 5:13 PM

## 2023-10-06 ENCOUNTER — Encounter: Payer: Self-pay | Admitting: Internal Medicine

## 2023-10-06 ENCOUNTER — Ambulatory Visit: Attending: Internal Medicine | Admitting: Internal Medicine

## 2023-10-06 VITALS — BP 112/71 | HR 73 | Resp 16 | Ht 60.0 in | Wt 174.0 lb

## 2023-10-06 DIAGNOSIS — M25512 Pain in left shoulder: Secondary | ICD-10-CM | POA: Insufficient documentation

## 2023-10-06 DIAGNOSIS — Z5181 Encounter for therapeutic drug level monitoring: Secondary | ICD-10-CM | POA: Diagnosis not present

## 2023-10-06 DIAGNOSIS — G5601 Carpal tunnel syndrome, right upper limb: Secondary | ICD-10-CM | POA: Insufficient documentation

## 2023-10-06 DIAGNOSIS — N1831 Chronic kidney disease, stage 3a: Secondary | ICD-10-CM | POA: Diagnosis not present

## 2023-10-06 DIAGNOSIS — G8929 Other chronic pain: Secondary | ICD-10-CM | POA: Insufficient documentation

## 2023-10-06 DIAGNOSIS — M25559 Pain in unspecified hip: Secondary | ICD-10-CM | POA: Insufficient documentation

## 2023-10-06 DIAGNOSIS — M10042 Idiopathic gout, left hand: Secondary | ICD-10-CM | POA: Insufficient documentation

## 2023-10-06 DIAGNOSIS — G5602 Carpal tunnel syndrome, left upper limb: Secondary | ICD-10-CM | POA: Insufficient documentation

## 2023-10-06 MED ORDER — LIDOCAINE HCL 1 % IJ SOLN
1.0000 mL | INTRAMUSCULAR | Status: AC | PRN
  Administered 2023-10-06: 1 mL

## 2023-10-06 MED ORDER — TRIAMCINOLONE ACETONIDE 40 MG/ML IJ SUSP
40.0000 mg | INTRAMUSCULAR | Status: AC | PRN
  Administered 2023-10-06: 40 mg

## 2023-10-06 MED ORDER — FEBUXOSTAT 40 MG PO TABS
40.0000 mg | ORAL_TABLET | Freq: Every day | ORAL | 1 refills | Status: DC
Start: 2023-10-06 — End: 2024-01-25

## 2023-10-07 ENCOUNTER — Encounter (HOSPITAL_COMMUNITY): Payer: Self-pay

## 2023-10-07 ENCOUNTER — Ambulatory Visit (HOSPITAL_COMMUNITY): Admitting: Physician Assistant

## 2023-10-07 ENCOUNTER — Encounter: Payer: Self-pay | Admitting: Pulmonary Disease

## 2023-10-07 ENCOUNTER — Ambulatory Visit: Attending: Pulmonary Disease | Admitting: Pulmonary Disease

## 2023-10-07 VITALS — BP 140/68 | HR 71 | Ht 60.0 in | Wt 175.4 lb

## 2023-10-07 DIAGNOSIS — I442 Atrioventricular block, complete: Secondary | ICD-10-CM | POA: Diagnosis not present

## 2023-10-07 DIAGNOSIS — Z79899 Other long term (current) drug therapy: Secondary | ICD-10-CM | POA: Diagnosis present

## 2023-10-07 DIAGNOSIS — I5042 Chronic combined systolic (congestive) and diastolic (congestive) heart failure: Secondary | ICD-10-CM | POA: Insufficient documentation

## 2023-10-07 DIAGNOSIS — D6869 Other thrombophilia: Secondary | ICD-10-CM | POA: Diagnosis present

## 2023-10-07 DIAGNOSIS — I480 Paroxysmal atrial fibrillation: Secondary | ICD-10-CM | POA: Insufficient documentation

## 2023-10-07 DIAGNOSIS — Z950 Presence of cardiac pacemaker: Secondary | ICD-10-CM | POA: Diagnosis present

## 2023-10-07 NOTE — Patient Instructions (Signed)
 Medication Instructions:  Your physician recommends that you continue on your current medications as directed. Please refer to the Current Medication list given to you today.  *If you need a refill on your cardiac medications before your next appointment, please call your pharmacy*  Lab Work: BMET, MAG-TODAY If you have labs (blood work) drawn today and your tests are completely normal, you will receive your results only by: MyChart Message (if you have MyChart) OR A paper copy in the mail If you have any lab test that is abnormal or we need to change your treatment, we will call you to review the results.  Follow-Up: At [facility], you and your health needs are our priority.  As part of our continuing mission to provide you with exceptional heart care, our providers are all part of one team.  This team includes your primary Cardiologist (physician) and Advanced Practice Providers or APPs (Physician Assistants and Nurse Practitioners) who all work together to provide you with the care you need, when you need it.  Your next appointment:   6 month(s)  Provider:   You may see Will Cortland Ding, MD or one of the following Advanced Practice Providers on your designated Care Team:   Mertha Abrahams, [place] Bambi Lever "[place]" [place], [place] Creighton Doffing, NP   Schedule 4 month follow up with the Afib clinic.

## 2023-10-08 NOTE — Telephone Encounter (Signed)
 Noted.

## 2023-10-09 IMAGING — DX DG CHEST 1V PORT
1 series · 1 of 1 positions shown · non-contrast
Comparison: None.

CLINICAL DATA: Evaluate for sepsis

EXAM:
PORTABLE CHEST 1 VIEW

[chest]
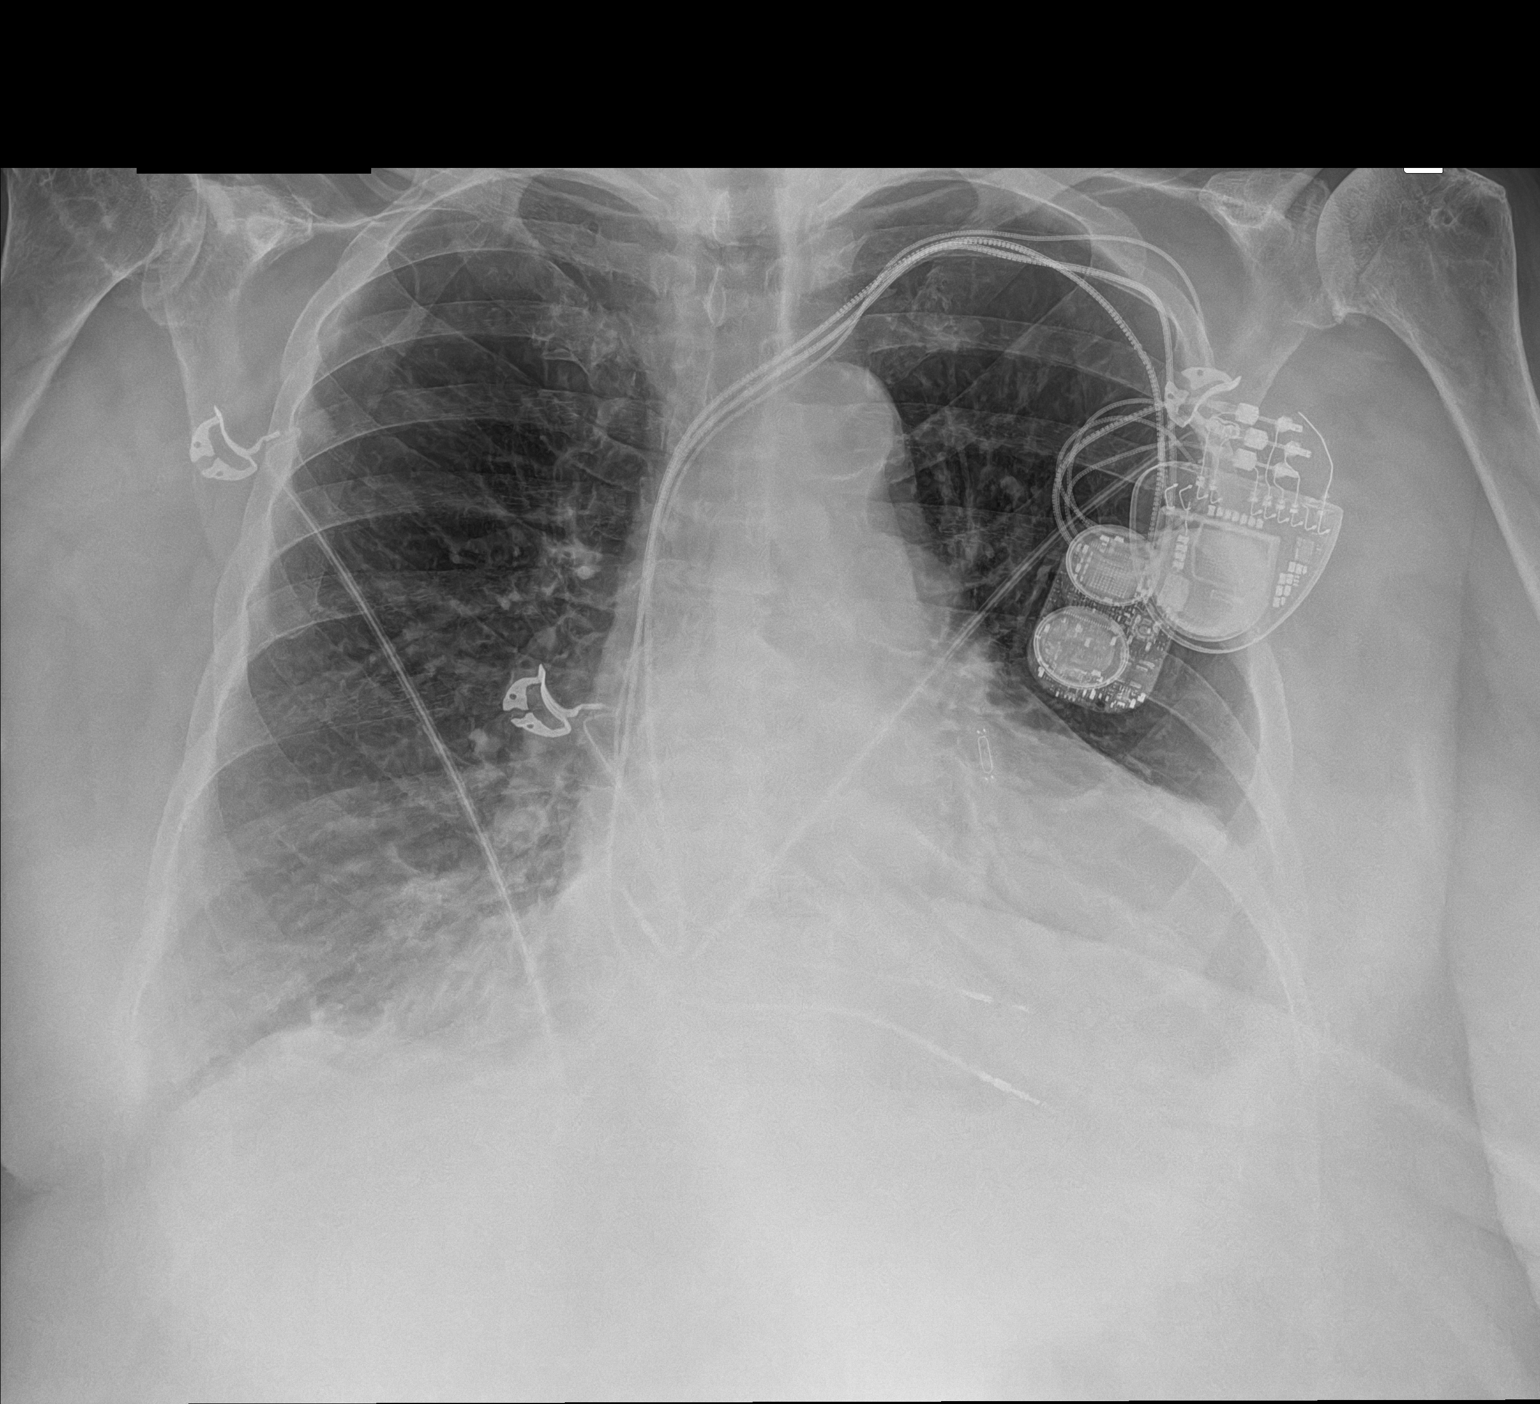

[1 of 1 positions shown; findings below may reference images not displayed]

FINDINGS: LEFT-sided pacemaker overlies stable enlarged cardiac silhouette.
There is bibasilar atelectasis and probable small effusions. No
pneumothorax. No pulmonary edema.
IMPRESSION: Cardiomegaly, bibasilar atelectasis and bilateral pleural effusions.

## 2023-10-18 ENCOUNTER — Other Ambulatory Visit (HOSPITAL_COMMUNITY): Payer: Self-pay | Admitting: Student

## 2023-10-18 ENCOUNTER — Ambulatory Visit: Attending: Cardiology

## 2023-10-18 ENCOUNTER — Ambulatory Visit (INDEPENDENT_AMBULATORY_CARE_PROVIDER_SITE_OTHER): Admitting: Orthopedic Surgery

## 2023-10-18 ENCOUNTER — Other Ambulatory Visit (INDEPENDENT_AMBULATORY_CARE_PROVIDER_SITE_OTHER): Payer: Self-pay

## 2023-10-18 ENCOUNTER — Encounter: Payer: Self-pay | Admitting: Orthopedic Surgery

## 2023-10-18 ENCOUNTER — Other Ambulatory Visit: Payer: Self-pay

## 2023-10-18 DIAGNOSIS — M25511 Pain in right shoulder: Secondary | ICD-10-CM

## 2023-10-18 DIAGNOSIS — M25512 Pain in left shoulder: Secondary | ICD-10-CM

## 2023-10-18 DIAGNOSIS — Z950 Presence of cardiac pacemaker: Secondary | ICD-10-CM

## 2023-10-18 DIAGNOSIS — M19012 Primary osteoarthritis, left shoulder: Secondary | ICD-10-CM

## 2023-10-18 DIAGNOSIS — M19011 Primary osteoarthritis, right shoulder: Secondary | ICD-10-CM

## 2023-10-18 DIAGNOSIS — I5042 Chronic combined systolic (congestive) and diastolic (congestive) heart failure: Secondary | ICD-10-CM | POA: Diagnosis not present

## 2023-10-18 DIAGNOSIS — N824 Other female intestinal-genital tract fistulae: Secondary | ICD-10-CM

## 2023-10-18 MED ORDER — OXYCODONE HCL 5 MG PO TABS: ORAL_TABLET | ORAL | 0 refills | Status: DC

## 2023-10-18 NOTE — Progress Notes (Unsigned)
 Office Visit Note   Patient: Kathleen [name]           Date of Birth: 1946-01-23           MRN: [id number] Visit Date: 10/18/2023 Requested by: Arcadio Knuckles, MD [address] [place],  [place] [address] PCP: Arcadio Knuckles, MD  Subjective: Chief Complaint  Patient presents with   Right Shoulder - Pain   Left Shoulder - Pain    HPI: Kathleen [name] is a 78 y.o. female who presents to the office reporting bilateral shoulder pain.  She reports decreased range of motion.  Also describes difficulty with ADLs.  She is left-hand dominant because of right shoulder disability.  She describes a feeling of grinding in the shoulders.  Takes gabapentin  at night.  Tramadol  does not help much.  Patient really cannot have surgery because of a long problem.  She did require diverticulosis surgery in November 2024 which was an emergency..  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  Her main request today is to have something that can help her handle the pain.  Patient also reports bilateral hip pain and back pain with prior SI joint injections without much relief.  She describes buttock pain but no numbness and tingling.  She cannot lay or sit or stand for a long period of time.  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  She does take Eliquis  and is on home O2.              ROS: All systems reviewed are negative as they relate to the chief complaint within the history of present illness.  Patient denies fevers or chills.  Assessment & Plan: Visit Diagnoses:  1. Bilateral shoulder pain, unspecified chronicity     Plan: Impression is bilateral shoulder pain and rotator cuff arthropathy in a patient who is not really a candidate for surgery because of multiple medical comorbidities.  Plan for today is ultrasound-guided injection into the left glenohumeral joint with the patient sitting.  Oxycodone  x 1 prescription  prescribed and we will send her to pain management.  She does not have any hip arthritis on plain radiographs but I think a lot of her back and leg pain is likely coming from the stenosis that she has which has likely progressed into years.  She will follow-up with us  as needed.  Follow-Up Instructions: No follow-ups on file.   Orders:  Orders Placed This Encounter  Procedures   XR Shoulder Right   XR Shoulder Left   No orders of the defined types were placed in this encounter.     Procedures: Large Joint Inj: L glenohumeral on 10/18/2023 6:31 AM Indications: diagnostic evaluation and pain Details: 22 G 3.5 in needle, ultrasound-guided posterior approach  Arthrogram: No  Medications: 9 mL bupivacaine  0.5 %; 5 mL lidocaine  1 % Outcome: tolerated well, no immediate complications Procedure, treatment alternatives, risks and benefits explained, specific risks discussed. Consent was given by the patient. Immediately prior to procedure a time out was called to verify the correct patient, procedure, equipment, support staff and site/side marked as required. Patient was prepped and draped in the usual sterile fashion.    Kenalog  given   Clinical Data: No additional findings.  Objective: Vital Signs: There were no vitals taken for this visit.  Physical Exam:  Constitutional: Patient appears well-developed HEENT:  Head: Normocephalic Eyes:EOM are normal Neck:  Normal range of motion Cardiovascular: Normal rate Pulmonary/chest: Effort normal Neurologic: Patient is alert Skin: Skin is warm Psychiatric: Patient has normal mood and affect  Ortho Exam: Ortho exam demonstrates functional deltoid on both sides but diminished active forward flexion and abduction.  EPL FPL interosseous strength is intact with palpable radial pulses on both sides.  Neck range of motion is full.  Significant crepitus and rotator cuff weakness in both shoulders with active and passive range of motion.   Bilateral lower extremities demonstrates 5 out of 5 ankle dorsiflexion plantarflexion quad and hamstring strength.  Both feet are perfused and sensate.  No muscle atrophy in either leg.  No groin pain with internal or external rotation of either hip.  Specialty Comments:  No specialty comments available.  Imaging: XR Shoulder Right Result Date: 10/18/2023 AP lateral outlet radiographs right shoulder reviewed.  Severe rotator cuff arthropathy is present along with glenohumeral joint arthritis.  Acetabular rotation of the humeral head acromial interface is present.  No acute fracture.  Shoulder is located.  Visualized lung fields intact.  XR Shoulder Left Result Date: 10/18/2023 AP axillary outlet radiographs left shoulder reviewed.  Pacemaker visualized.  Small calcified loose body present just superior to the coracoid process.  Superior migration of the humeral head is present consistent with diagnosis of rotator cuff arthropathy.  No acute fracture.  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   Carpal tunnel syndrome, right upper limb 01/27/2023   COPD with acute exacerbation (HCC) 10/09/2022   Chronic obstructive pulmonary disease (HCC) 06/28/2022   Palliative care encounter 06/28/2022   Anticoagulated by anticoagulation treatment 06/11/2022   Supplemental oxygen  dependent 06/11/2022   Heart failure with  reduced ejection fraction (HCC) 05/14/2022   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   Depression, major, single episode, moderate (HCC) 04/27/2022   Obesity (BMI 30-39.9) 03/18/2022   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   Low TSH level 12/02/2021   Acquired hypothyroidism 11/12/2021   Encounter for palliative care involving management of pain 11/12/2021   Chronic left-sided low back pain 11/04/2021   Gout 10/01/2021   Hyperlipidemia with target LDL less than 100 08/07/2021   Chronic anticoagulation 07/07/2021   Stage 3a chronic kidney disease (HCC) 05/23/2021   Dietary iron deficiency 05/23/2021   Age-related osteoporosis without current pathological fracture 04/22/2021   Type 2 diabetes mellitus with diabetic neuropathy, without long-term current use of insulin  (HCC) 04/22/2021   Sleep apnea 01/22/2021   Physical deconditioning 01/16/2020   Statin intolerance 10/12/2019   Statin myopathy 02/24/2019   Chronic combined systolic and diastolic heart failure (HCC) 04/06/2018   HTN (hypertension) 04/06/2018   COPD (chronic obstructive pulmonary disease) (HCC) 04/06/2018   Persistent atrial fibrillation (HCC) 04/06/2018   Chronic respiratory failure with hypoxia (HCC) 03/28/2018   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 12/02/2015   Protein-calorie malnutrition, moderate (HCC) 09/11/2015   Past Medical History:  Diagnosis Date   Asthma    BOOP (bronchiolitis obliterans  with organizing pneumonia) (HCC)    CHF (congestive heart failure) (HCC)    Chronic renal insufficiency    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    Concussion 10/04/2021   COPD (chronic obstructive pulmonary disease) (HCC)    Diverticulitis    Gout    Hypertension    Longstanding persistent atrial fibrillation (HCC)    on Xarelto    Nonischemic cardiomyopathy (HCC)    Obesity    Pacemaker    Spontaneous pneumothorax 2013    Family History  Problem Relation Age of Onset   Breast cancer  Mother 75       3 different times   Pancreatic cancer Mother    Emphysema Father    Healthy Brother    Breast cancer Cousin    Valvular heart disease Son    Obesity Daughter    Colon cancer Neg Hx    Esophageal cancer Neg Hx    Stomach cancer Neg Hx    Inflammatory bowel disease Neg Hx    Liver disease Neg Hx    Rectal cancer Neg Hx     Past Surgical History:  Procedure Laterality Date   ABDOMINAL HYSTERECTOMY     APPENDECTOMY     APPLICATION OF WOUND VAC  04/27/2023   Procedure: APPLICATION OF WOUND VAC;  Surgeon: Enid Harry, MD;  Location: [facility] OR;  Service: General;;   ATRIAL FIBRILLATION ABLATION  07/20/2013   by Dr Ginny Lair   AV nodal ablation  11/01/2013   by Dr Ginny Lair, repeated by Dr Alvena Jo   BREAST BIOPSY Bilateral 1997   negative   CARDIAC CATHETERIZATION     CHOLECYSTECTOMY     COLON RESECTION SIGMOID N/A 04/27/2023   Procedure: COLON RESECTION SIGMOID WITH COLOSTOMY;  Surgeon: Enid Harry, MD;  Location: [facility] OR;  Service: General;  Laterality: N/A;   ESOPHAGOGASTRODUODENOSCOPY (EGD) WITH PROPOFOL  N/A 05/24/2023   Procedure: ESOPHAGOGASTRODUODENOSCOPY (EGD) WITH PROPOFOL ;  Surgeon: Nannette Babe, MD;  Location: [facility] ENDOSCOPY;  Service: Gastroenterology;  Laterality: N/A;   HERNIA REPAIR     LAPAROTOMY N/A 04/27/2023   Procedure: EXPLORATORY LAPAROTOMY;  Surgeon: Enid Harry, MD;  Location: [facility] OR;  Service: General;  Laterality: N/A;   PACEMAKER INSERTION  06/2017   MDT Viva CRT-P implanted by Dr Ginny Lair after AV nodal ablation,  LV lead could not be placed   PACEMAKER INSERTION  05/2020   with lead bundle   RIGHT/LEFT HEART CATH AND CORONARY ANGIOGRAPHY N/A 03/20/2022   Procedure: RIGHT/LEFT HEART CATH AND CORONARY ANGIOGRAPHY;  Surgeon: Swazil[place], Peter M, MD;  Location: [facility] INVASIVE CV LAB;  Service: Cardiovascular;  Laterality: N/A;   Social History   Occupational History   Occupation: retired  Tobacco Use   Smoking status: Former    Current  packs/day: 0.00    Average packs/day: 1 pack/day for 20.0 years (20.0 ttl pk-yrs)    Types: Cigarettes    Start date: 06/09/1967    Quit date: 06/09/1987    Years since quitting: 36.3    Passive exposure: Past   Smokeless tobacco: Never   Tobacco comments:    Former smoker 12/25/21  Vaping Use   Vaping status: Never Used  Substance and Sexual Activity   Alcohol use: No    Alcohol/week: 0.0 standard drinks of alcohol   Drug use: No   Sexual activity: Not Currently

## 2023-10-21 ENCOUNTER — Other Ambulatory Visit: Payer: Self-pay | Admitting: Internal Medicine

## 2023-10-21 ENCOUNTER — Encounter: Payer: Self-pay | Admitting: Orthopedic Surgery

## 2023-10-21 ENCOUNTER — Encounter: Payer: Self-pay | Admitting: Internal Medicine

## 2023-10-21 DIAGNOSIS — B354 Tinea corporis: Secondary | ICD-10-CM | POA: Insufficient documentation

## 2023-10-21 MED ORDER — KETOCONAZOLE 2 % EX CREA: 1.0000 | TOPICAL_CREAM | Freq: Two times a day (BID) | CUTANEOUS | 2 refills | Status: AC

## 2023-10-21 MED ORDER — BUPIVACAINE HCL 0.5 % IJ SOLN
9.0000 mL | INTRAMUSCULAR | Status: AC | PRN
  Administered 2023-10-18: 9 mL via INTRA_ARTICULAR

## 2023-10-21 MED ORDER — LIDOCAINE HCL 1 % IJ SOLN
5.0000 mL | INTRAMUSCULAR | Status: AC | PRN
  Administered 2023-10-18: 5 mL

## 2023-10-22 ENCOUNTER — Other Ambulatory Visit: Payer: Self-pay | Admitting: Internal Medicine

## 2023-10-22 DIAGNOSIS — L089 Local infection of the skin and subcutaneous tissue, unspecified: Secondary | ICD-10-CM

## 2023-10-22 MED ORDER — MUPIROCIN 2 % EX OINT: 1.0000 | TOPICAL_OINTMENT | Freq: Two times a day (BID) | CUTANEOUS | 3 refills | Status: AC

## 2023-10-22 NOTE — Progress Notes (Signed)
 EPIC Encounter for ICM Monitoring  Patient Name: Kathleen [name] is a 78 y.o. female Date: 10/22/2023 Primary Care Physican: Arcadio Knuckles, MD Primary Cardiologist: Bensimhon Electrophysiologist: Camnitz Bi-V Pacing: 100%          03/16/2023 Weight: 190 lbs       03/23/2023 Weight: 188 lbs   04/06/2023 Weight: 176 lbs    06/30/2023 Weight: 178 lbs 08/02/2023 Weight: 172 lbs 09/15/2023 Weight: 177 lbs   Since 07-Oct-2023 Time in AT/AF  0.0 hr/day (0.0%)                                 Spoke with patient and heart failure questions reviewed.  Transmission results reviewed.  Pt asymptomatic for fluid accumulation.  She has had some dizziness and cut back on Torsemide .  She has a CT scan scheduled with in the next week.      Diet:  She lives in an assisted living  Eats foods that are prepared at the facility she lives in which are not low salt.     Optivol thoracic impedance suggesting normal fluid levels since Optivol was adjusted 10/07/2023.    Prescribed:  Torsemide  20 mg take 2 tablets (40 mg total) by mouth daily.  Pt reports on 5/16 that she decreased Torsemide  to 1 tablet daily about a month ago due to dizziness.   Potassium 10 mEq take 1 tablet by mouth three (3) times a day  Spironolactone  25 mg take 0.5 (12.5 mg total) tablet twice a day   Labs: 09/17/2023 Creatinine 1.17, BUN 323, Potassium 3.7, Sodium 139, GFR 48  09/09/2023 Creatinine 0.70, BUN 21, Potassium 3.8, Sodium 139, GFR >60  08/19/2023 Creatinine 0.78, BUN 23, Potassium 3.5, Sodium 132, GFR >60  08/03/2023 Creatinine 0.75, BUN 19, Potassium 3.3, Sodium 140, GFR 77.24 (Potassium started 2/28) 06/30/2023 Creatinine 0.69, BUN 16, Potassium 3.4, Sodium 142, GFR 84.25   A complete set of results can be found in Results Review.   Recommendations:  No changes and encouraged to call if experiencing any fluid symptoms.   Follow-up plan: ICM clinic phone appointment on 11/22/2023.  91 day device clinic remote transmission  12/29/2023.     EP/Cardiology Office Visits:  01/10/2024 with HF Clinic.     Copy of ICM check sent to Dr. Lawana Pray.    3 month ICM trend: 10/18/2023.    12-14 Month ICM trend:     Almyra Jain, RN 10/22/2023 7:26 AM

## 2023-10-27 ENCOUNTER — Ambulatory Visit (HOSPITAL_COMMUNITY)
Admission: RE | Admit: 2023-10-27 | Discharge: 2023-10-27 | Disposition: A | Discharge status: Home / Self Care | Source: Ambulatory Visit | Attending: Student | Admitting: Student

## 2023-10-27 ENCOUNTER — Other Ambulatory Visit (HOSPITAL_COMMUNITY)

## 2023-10-27 ENCOUNTER — Encounter (HOSPITAL_COMMUNITY): Payer: Self-pay

## 2023-10-27 DIAGNOSIS — N824 Other female intestinal-genital tract fistulae: Secondary | ICD-10-CM | POA: Insufficient documentation

## 2023-10-27 MED ORDER — IOHEXOL 300 MG/ML  SOLN
50.0000 mL | Freq: Once | INTRAMUSCULAR | Status: AC | PRN
  Administered 2023-10-27: 50 mL

## 2023-10-27 MED ORDER — IOHEXOL 9 MG/ML PO SOLN
500.0000 mL | ORAL | Status: AC
  Administered 2023-10-27: 500 mL via ORAL
  Administered 2023-10-27: 1000 mL via ORAL

## 2023-10-27 MED ORDER — IOHEXOL 9 MG/ML PO SOLN
ORAL | Status: AC
Start: 2023-10-27 — End: ?
  Filled 2023-10-27: qty 1000

## 2023-10-27 MED ORDER — IOHEXOL 300 MG/ML  SOLN
100.0000 mL | Freq: Once | INTRAMUSCULAR | Status: AC | PRN
  Administered 2023-10-27: 100 mL via INTRAVENOUS

## 2023-10-28 ENCOUNTER — Encounter (HOSPITAL_BASED_OUTPATIENT_CLINIC_OR_DEPARTMENT_OTHER): Payer: Self-pay | Admitting: Pulmonary Disease

## 2023-10-28 ENCOUNTER — Ambulatory Visit (INDEPENDENT_AMBULATORY_CARE_PROVIDER_SITE_OTHER): Admitting: Pulmonary Disease

## 2023-10-28 VITALS — BP 127/63 | HR 82 | Ht 60.0 in | Wt 178.2 lb

## 2023-10-28 DIAGNOSIS — Z87891 Personal history of nicotine dependence: Secondary | ICD-10-CM | POA: Diagnosis not present

## 2023-10-28 DIAGNOSIS — J9611 Chronic respiratory failure with hypoxia: Secondary | ICD-10-CM

## 2023-10-28 DIAGNOSIS — J45909 Unspecified asthma, uncomplicated: Secondary | ICD-10-CM

## 2023-10-28 DIAGNOSIS — J4489 Other specified chronic obstructive pulmonary disease: Secondary | ICD-10-CM

## 2023-10-28 NOTE — Progress Notes (Signed)
 Subjective:    Patient ID: Kathleen [name], female    DOB: [birth year], 78 y.o.   MRN: [id number]  HPI    78 yo woman, ex-smoker with asthma/COPD overlap syndrome, tracheobronchomalacia and chronic hypoxic respiratory failure  -She uses 3 L pulse at rest and 4 L on ambulation -noct bipap   Asthma onset in 20s She was previously followed at [facility], evaluated by Dr. 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 admitted from 04/2022 for acute on chronic respiratory failure secondary to RSV pneumonia/Boop? >> prolonged steroid taper upon discharge.   Meds -She was switched from an inhaler regimen in 2022 to budesonide /Brovana  and Yupelri  combination -did not tolerate budesonide /Brovana  due to joint pains - did not tolerate symbicort   9 month FU  Accompanied by son  Her breathing remains stable, but she feels Yupelri  is less effective. She uses Breo 100 daily and rarely needs Xopenex  for rescue. The BiPAP machine is not used frequently due to bed setup issues. She has a persistent cough with yellowish mucus and occasionally uses Mucinex  to clear her airways. Diverticulitis with perforation required surgical intervention. Colostomy bag in place with no plans for reversal due to anesthesia risks. Fistula has not resolved as expected.    Significant tests/ events reviewed BiPAP titration study 01/2021 12/8 cm   ABG  03/2022 7.3 4/33/87 04/2022 CT sinuses diffuse paranasal sinus disease  03/2022 CTA chest neg  08/2021 CT chest without contrast emphysema, stable nodules   HRCT 01/2021 emphysema, tracheobronchomalacia , bland scarring both bases , new atelectasis/consolidation of the lingula with scattered groundglass in left upper lobe   01/2020  With exertion her lowest oxygen  saturation was 92% on room air. Her peak HR was 103. She walked a total of 73m which was 25% predicted     Spiro 01/2021 severe airway obstruction, ratio 59, FEV1 0.95/48%, FVC 1.61/64%  Review of Systems neg for any significant sore throat, dysphagia, itching, sneezing, nasal congestion or excess/ purulent secretions, fever, chills, sweats, unintended wt loss, pleuritic or exertional cp, hempoptysis, orthopnea pnd or change in chronic leg swelling. Also denies presyncope, palpitations, heartburn, abdominal pain, nausea, vomiting, diarrhea or change in bowel or urinary habits, dysuria,hematuria, rash, arthralgias, visual complaints, headache, numbness weakness or ataxia.     Objective:   Physical Exam  Gen. Pleasant, obese, in no distress ENT - no lesions, no post nasal drip Neck: No JVD, no thyromegaly, no carotid bruits Lungs: no use of accessory muscles, no dullness to percussion, decreased without rales or rhonchi  Cardiovascular: Rhythm regular, heart sounds  normal, no murmurs or gallops, no peripheral edema Musculoskeletal: No deformities, no cyanosis or clubbing , no tremors       Assessment & Plan:   Chronic respiratory failure Chronic respiratory failure with intermittent exacerbations. Concerns about the effectiveness of Yupelri . Limited BiPAP usage due to physical obstruction in the bedroom. BiPAP may stent the airway during sleep, potentially benefiting daytime breathing. - Encourage BiPAP  use during sleep to stent airway - Hold off on Yupelri  and monitor symptoms - Consider Mucinex  for mucus clearance as  needed  Tracheobronchomalacia  BiPAP may stent the airway during sleep, providing daytime benefits. - Encourage BiPAP use during sleep to stent airway  Asthma Asthma managed with Breo 100, which is effective. Yupelri 's effectiveness is questionable, with increased dyspnea reported post-use. Rescue inhaler (albuterol ) is available but rarely used. - Continue Breo 100 for asthma management - Hold off on Yupelri  and monitor symptoms - Ensure availability of rescue inhaler (albuterol )

## 2023-10-28 NOTE — Patient Instructions (Signed)
 YOUR PLAN:  -CHRONIC RESPIRATORY FAILURE: Chronic respiratory failure means your lungs are not able to get enough oxygen  into your blood or remove enough carbon dioxide from it.  Please try to use your BiPAP machine during sleep to help keep your airway open, and consider using Mucinex  as needed to help clear mucus.    -ASTHMA: Asthma is a condition where your airways narrow and swell, making it hard to breathe. Continue using Breo 100 daily for asthma management. We will hold off on Yupelri  and monitor your symptoms. Make sure you have your rescue inhaler (albuterol ) available if needed.

## 2023-11-03 ENCOUNTER — Encounter (HOSPITAL_COMMUNITY): Payer: Self-pay

## 2023-11-04 ENCOUNTER — Encounter: Payer: Self-pay | Admitting: Podiatry

## 2023-11-04 ENCOUNTER — Ambulatory Visit: Admitting: Podiatry

## 2023-11-04 VITALS — Ht 60.0 in | Wt 178.0 lb

## 2023-11-04 DIAGNOSIS — L600 Ingrowing nail: Secondary | ICD-10-CM | POA: Diagnosis not present

## 2023-11-04 NOTE — Patient Instructions (Signed)

## 2023-11-05 NOTE — Progress Notes (Signed)
 Subjective:   Patient ID: Kathleen [name], female   DOB: 78 y.o.   MRN: [id number]   HPI Patient presents with a chronic ingrown toenail of the right big toe states it has been very sore she has tried to trim it to soak it without relief   ROS      Objective:  Physical Exam  Neurovascular status intact with an incurvated lateral border right big toe painful when pressed making shoe gear difficult     Assessment:  Ingrown toenail deformity right hallux lateral border painful     Plan:  H&P reviewed recommended correction of deformity explained procedure risk patient wants this fixed signed consent form understanding risk.  Infiltrated the right big toe 60 mg like Marcaine  mixture sterile prep done using sterile instrumentation remove the border exposed matrix applied phenol 3 applications 30 seconds followed by alcohol lavage sterile dressing gave instructions on soaks wear dressing 24 hours take it off earlier if throbbing were to occur and patient is encouraged to call with any questions which may come up

## 2023-11-09 ENCOUNTER — Telehealth: Payer: Self-pay | Admitting: Adult Health

## 2023-11-09 NOTE — Telephone Encounter (Signed)
 Cmn received from [facility] for Nebulizer.

## 2023-11-10 NOTE — Progress Notes (Signed)
 Remote pacemaker transmission.

## 2023-11-10 NOTE — Addendum Note (Signed)
 Addended by: Lott Rouleau A on: 11/10/2023 12:56 PM   Modules accepted: Orders

## 2023-11-11 ENCOUNTER — Telehealth: Payer: Self-pay

## 2023-11-11 NOTE — Telephone Encounter (Signed)
 Returned call to patient per voice mail request.    She has Torsemide  40 mg prescribed daily but decreased Torsemide  dosage to 20 mg a day at least a month ago.  because the 40 mg was causing severe dizziness and having to hold on to furniture to walk around the house.  The dizziness greatly improved on 20 mg dosage daily.    She sent mychart message to Dr Madelyne Schiff office 5/28 informing of Torsemide  adjustment and Vernia Good, NP requested she send a remote transmission that day but patient never saw the message.  SX: She sent remote transmission 6/5 for review due to gaining 4-5 lbs over a few days.    Her question is:   She should return to Torsemide  40 mg daily and take a medication for the dizziness.    Weight 11/11/2023: 176 lbs (was 171 lbs)  Per 5/28 mychart message, Vernia Good, NP asked for remote transmission to review fluid levels.  Pt sent today, 6/5 for review.       Optivol thoracic impedance suggesting possible fluid accumulation from 5/20-5/30.

## 2023-11-12 ENCOUNTER — Encounter: Payer: Self-pay | Admitting: *Deleted

## 2023-11-12 ENCOUNTER — Other Ambulatory Visit: Payer: Self-pay | Admitting: Internal Medicine

## 2023-11-12 DIAGNOSIS — I10 Essential (primary) hypertension: Secondary | ICD-10-CM

## 2023-11-12 DIAGNOSIS — E876 Hypokalemia: Secondary | ICD-10-CM

## 2023-11-12 NOTE — Telephone Encounter (Addendum)
 Spoke with patient on the phone and she stated she had gained 4 lbs from 6/5-6/6 (176 lbs-180 lbs).  She stated she weighed herself at same time in the morning.  RN requested patient obtain a self BP.  Patient's BP reading (left forearm) during phone call was 126/74, HR 72 .  Patient denies having any symptoms of lightheadedness or dizziness this morning.  Patient did state that they are having some SOB this morning.  Patient again, wants to know if she should continue with just the 20 mg of Torsemide  or take the full 40 mg.  This RN instructed the patient to take 20 mg of Torsemide  now and will follow up with patient after provider gives further instructions. Patient's prescription is 40 mg once daily which might be too aggressive all at once, and could be causing the orthostatic BP's and dizziness.  Recommend Torsemide  dose be adjusted to 20 mg BID for a total of 40 mg daily

## 2023-11-16 NOTE — Telephone Encounter (Signed)
 Spoke with patient but she was in physician office and unable to talk.  Will call her back

## 2023-11-16 NOTE — Telephone Encounter (Signed)
 [place], [place], [place]  Mariella Shore, RN   Her device interrogation shows she is on the dry side, so makes sense her weight is up a bit after pulling back on the diuretics, which is what we want. Keep torsemide  at 20 mg daily

## 2023-11-17 NOTE — Telephone Encounter (Signed)
 Spoke with patient.  Advised that Vernia Good, NP at HF clinic advised her to stay on Torsemide  20 mg and she confirmed she is taking that dosage.  She continues to gain weight and today was 182 lbs.    She does have back and hip pain and is following with physician.  She is having some dizziness which she thinks it may be related to pain med.    ICM remote transmission scheduled 6/16.

## 2023-11-22 ENCOUNTER — Ambulatory Visit: Attending: Cardiology

## 2023-11-22 DIAGNOSIS — I5042 Chronic combined systolic (congestive) and diastolic (congestive) heart failure: Secondary | ICD-10-CM | POA: Diagnosis not present

## 2023-11-22 DIAGNOSIS — Z950 Presence of cardiac pacemaker: Secondary | ICD-10-CM

## 2023-11-22 NOTE — Progress Notes (Signed)
 EPIC Encounter for ICM Monitoring  Patient Name: Kathleen [name] is a 78 y.o. female Date: 11/22/2023 Primary Care Physican: Arcadio Knuckles, MD Primary Cardiologist: Bensimhon Electrophysiologist: Camnitz Bi-V Pacing: 100%          03/16/2023 Weight: 190 lbs       03/23/2023 Weight: 188 lbs   04/06/2023 Weight: 176 lbs    06/30/2023 Weight: 178 lbs 08/02/2023 Weight: 172 lbs 09/15/2023 Weight: 177 lbs 11/11/2023 Weight: 176 lbs 11/17/2023 Weight: 182 lbs 11/22/2023 Weight: 185 lbs   Since 07-Oct-2023 Time in AT/AF  0.0 hr/day (0.0%)                                 Spoke with patient and heart failure questions reviewed.  Transmission results reviewed.  Pt reports increase in SOB and weight gain of 8 lbs since 6/5.   Confirmed she has been taking 20 mg Torsemide  instead of the 40 due to dizziness side effects.   Diet:  She lives in an assisted living  Eats foods that are prepared at the facility she lives in which are not low salt.     Optivol thoracic impedance suggesting possible fluid accumulation starting 6/6.    Prescribed:  Torsemide  20 mg take 2 tablets (40 mg total) by mouth daily.  6/6 Vernia Good, NP suggested pt stay on 20 mg daily since he gets orthostatic on 40 mg.  Pt self adjusted Torsemide  in May to 20 mg daily d/t severe dizziness when taking 40 mg   Potassium 10 mEq take 1 tablet by mouth three (3) times a day  Spironolactone  25 mg take 0.5 (12.5 mg total) tablet twice a day   Labs: 09/17/2023 Creatinine 1.17, BUN 323, Potassium 3.7, Sodium 139, GFR 48  09/09/2023 Creatinine 0.70, BUN 21, Potassium 3.8, Sodium 139, GFR >60  08/19/2023 Creatinine 0.78, BUN 23, Potassium 3.5, Sodium 132, GFR >60  08/03/2023 Creatinine 0.75, BUN 19, Potassium 3.3, Sodium 140, GFR 77.24 (Potassium started 2/28) 06/30/2023 Creatinine 0.69, BUN 16, Potassium 3.4, Sodium 142, GFR 84.25   A complete set of results can be found in Results Review.   Recommendations:  Pt will try taking  Torsemide  20 mg twice a day every other day this week to help with fluid accumulation. She will return to 20 mg daily and call back if she has increase in dizziness.    Follow-up plan: ICM clinic phone appointment on 01/03/2024.  91 day device clinic remote transmission 12/29/2023.     EP/Cardiology Office Visits:  01/10/2024 with HF Clinic.     Copy of ICM check sent to Dr. Lawana Pray and Dr Julane Ny for reivew and Arlie Lain.    3 month ICM trend: 11/22/2023.    12-14 Month ICM trend:     Almyra Jain, RN 11/22/2023 11:56 AM

## 2023-11-26 ENCOUNTER — Other Ambulatory Visit: Payer: Self-pay

## 2023-11-26 ENCOUNTER — Inpatient Hospital Stay (HOSPITAL_COMMUNITY)
Admission: EM | Admit: 2023-11-26 | Discharge: 2023-11-29 | DRG: 690 | Disposition: A | Discharge status: Home Health | Attending: Internal Medicine | Admitting: Internal Medicine

## 2023-11-26 ENCOUNTER — Emergency Department (HOSPITAL_COMMUNITY)

## 2023-11-26 ENCOUNTER — Encounter (HOSPITAL_COMMUNITY): Payer: Self-pay

## 2023-11-26 ENCOUNTER — Observation Stay (HOSPITAL_COMMUNITY)

## 2023-11-26 DIAGNOSIS — Z887 Allergy status to serum and vaccine status: Secondary | ICD-10-CM

## 2023-11-26 DIAGNOSIS — I5022 Chronic systolic (congestive) heart failure: Secondary | ICD-10-CM | POA: Diagnosis present

## 2023-11-26 DIAGNOSIS — Z933 Colostomy status: Secondary | ICD-10-CM

## 2023-11-26 DIAGNOSIS — J8489 Other specified interstitial pulmonary diseases: Secondary | ICD-10-CM | POA: Diagnosis present

## 2023-11-26 DIAGNOSIS — K219 Gastro-esophageal reflux disease without esophagitis: Secondary | ICD-10-CM | POA: Diagnosis present

## 2023-11-26 DIAGNOSIS — J9611 Chronic respiratory failure with hypoxia: Secondary | ICD-10-CM | POA: Diagnosis not present

## 2023-11-26 DIAGNOSIS — Z87891 Personal history of nicotine dependence: Secondary | ICD-10-CM

## 2023-11-26 DIAGNOSIS — J449 Chronic obstructive pulmonary disease, unspecified: Secondary | ICD-10-CM | POA: Diagnosis present

## 2023-11-26 DIAGNOSIS — I5020 Unspecified systolic (congestive) heart failure: Secondary | ICD-10-CM | POA: Diagnosis present

## 2023-11-26 DIAGNOSIS — Z881 Allergy status to other antibiotic agents status: Secondary | ICD-10-CM

## 2023-11-26 DIAGNOSIS — I110 Hypertensive heart disease with heart failure: Secondary | ICD-10-CM | POA: Diagnosis present

## 2023-11-26 DIAGNOSIS — Z91048 Other nonmedicinal substance allergy status: Secondary | ICD-10-CM

## 2023-11-26 DIAGNOSIS — Z7901 Long term (current) use of anticoagulants: Secondary | ICD-10-CM

## 2023-11-26 DIAGNOSIS — Z79899 Other long term (current) drug therapy: Secondary | ICD-10-CM

## 2023-11-26 DIAGNOSIS — I4891 Unspecified atrial fibrillation: Secondary | ICD-10-CM | POA: Diagnosis present

## 2023-11-26 DIAGNOSIS — Z888 Allergy status to other drugs, medicaments and biological substances status: Secondary | ICD-10-CM

## 2023-11-26 DIAGNOSIS — I4811 Longstanding persistent atrial fibrillation: Secondary | ICD-10-CM | POA: Diagnosis present

## 2023-11-26 DIAGNOSIS — J410 Simple chronic bronchitis: Secondary | ICD-10-CM | POA: Diagnosis not present

## 2023-11-26 DIAGNOSIS — R42 Dizziness and giddiness: Principal | ICD-10-CM

## 2023-11-26 DIAGNOSIS — N390 Urinary tract infection, site not specified: Secondary | ICD-10-CM | POA: Diagnosis not present

## 2023-11-26 DIAGNOSIS — Z950 Presence of cardiac pacemaker: Secondary | ICD-10-CM

## 2023-11-26 DIAGNOSIS — I5042 Chronic combined systolic (congestive) and diastolic (congestive) heart failure: Secondary | ICD-10-CM

## 2023-11-26 DIAGNOSIS — N3000 Acute cystitis without hematuria: Secondary | ICD-10-CM | POA: Diagnosis not present

## 2023-11-26 DIAGNOSIS — Z9889 Other specified postprocedural states: Secondary | ICD-10-CM

## 2023-11-26 DIAGNOSIS — Z90710 Acquired absence of both cervix and uterus: Secondary | ICD-10-CM

## 2023-11-26 DIAGNOSIS — G8929 Other chronic pain: Secondary | ICD-10-CM | POA: Diagnosis present

## 2023-11-26 DIAGNOSIS — Z8719 Personal history of other diseases of the digestive system: Secondary | ICD-10-CM

## 2023-11-26 DIAGNOSIS — H8110 Benign paroxysmal vertigo, unspecified ear: Secondary | ICD-10-CM | POA: Diagnosis present

## 2023-11-26 DIAGNOSIS — I428 Other cardiomyopathies: Secondary | ICD-10-CM | POA: Diagnosis present

## 2023-11-26 DIAGNOSIS — Z825 Family history of asthma and other chronic lower respiratory diseases: Secondary | ICD-10-CM

## 2023-11-26 DIAGNOSIS — J4489 Other specified chronic obstructive pulmonary disease: Secondary | ICD-10-CM | POA: Diagnosis present

## 2023-11-26 LAB — URINALYSIS, ROUTINE W REFLEX MICROSCOPIC
Bilirubin Urine: NEGATIVE
Glucose, UA: NEGATIVE mg/dL
Ketones, ur: NEGATIVE mg/dL
Nitrite: NEGATIVE
Protein, ur: NEGATIVE mg/dL
Specific Gravity, Urine: 1.006 (ref 1.005–1.030)
WBC, UA: >50 WBC/hpf (ref 0–5)
pH: 7 (ref 5.0–8.0)

## 2023-11-26 LAB — CBC
HCT: 43.2 % (ref 36.0–46.0)
Hemoglobin: 13.4 g/dL (ref 12.0–15.0)
MCH: 26.1 pg (ref 26.0–34.0)
MCHC: 31 g/dL (ref 30.0–36.0)
MCV: 84 fL (ref 80.0–100.0)
Platelets: 193 10*3/uL (ref 150–400)
RBC: 5.14 MIL/uL — ABNORMAL HIGH (ref 3.87–5.11)
RDW: 15.8 % — ABNORMAL HIGH (ref 11.5–15.5)
WBC: 9.8 10*3/uL (ref 4.0–10.5)
nRBC: 0 % (ref 0.0–0.2)

## 2023-11-26 LAB — COMPREHENSIVE METABOLIC PANEL WITH GFR
ALT: 11 U/L (ref 0–44)
AST: 14 U/L — ABNORMAL LOW (ref 15–41)
Albumin: 3.8 g/dL (ref 3.5–5.0)
Alkaline Phosphatase: 49 U/L (ref 38–126)
Anion gap: 14 (ref 5–15)
BUN: 19 mg/dL (ref 8–23)
CO2: 26 mmol/L (ref 22–32)
Calcium: 9.7 mg/dL (ref 8.9–10.3)
Chloride: 99 mmol/L (ref 98–111)
Creatinine, Ser: 0.63 mg/dL (ref 0.44–1.00)
GFR, Estimated: >60 mL/min (ref >60)
Glucose, Bld: 104 mg/dL — ABNORMAL HIGH (ref 70–99)
Potassium: 3.8 mmol/L (ref 3.5–5.1)
Sodium: 139 mmol/L (ref 135–145)
Total Bilirubin: 0.8 mg/dL (ref 0.0–1.2)
Total Protein: 6.2 g/dL — ABNORMAL LOW (ref 6.5–8.1)

## 2023-11-26 LAB — BRAIN NATRIURETIC PEPTIDE: B Natriuretic Peptide: 101.8 pg/mL — ABNORMAL HIGH (ref 0.0–100.0)

## 2023-11-26 LAB — PROCALCITONIN: Procalcitonin: <0.1 ng/mL

## 2023-11-26 LAB — CBG MONITORING, ED: Glucose-Capillary: 101 mg/dL — ABNORMAL HIGH (ref 70–99)

## 2023-11-26 MED ORDER — METOCLOPRAMIDE HCL 5 MG/ML IJ SOLN
10.0000 mg | Freq: Once | INTRAMUSCULAR | Status: AC
  Administered 2023-11-26: 10 mg via INTRAVENOUS
  Filled 2023-11-26: qty 2

## 2023-11-26 MED ORDER — ACETAMINOPHEN 500 MG PO TABS
1000.0000 mg | ORAL_TABLET | Freq: Once | ORAL | Status: AC
  Administered 2023-11-26: 1000 mg via ORAL
  Filled 2023-11-26: qty 2

## 2023-11-26 MED ORDER — ACETAMINOPHEN 650 MG RE SUPP: 650.0000 mg | Freq: Four times a day (QID) | RECTAL | Status: DC | PRN

## 2023-11-26 MED ORDER — OXYCODONE-ACETAMINOPHEN 5-325 MG PO TABS
1.0000 | ORAL_TABLET | Freq: Three times a day (TID) | ORAL | Status: DC | PRN
  Administered 2023-11-26 – 2023-11-29 (×7): 1 via ORAL
  Filled 2023-11-26 (×7): qty 1

## 2023-11-26 MED ORDER — APIXABAN 5 MG PO TABS
5.0000 mg | ORAL_TABLET | Freq: Two times a day (BID) | ORAL | Status: DC
  Administered 2023-11-26 – 2023-11-29 (×6): 5 mg via ORAL
  Filled 2023-11-26 (×6): qty 1

## 2023-11-26 MED ORDER — TRIMETHOBENZAMIDE HCL 100 MG/ML IM SOLN: 200.0000 mg | Freq: Four times a day (QID) | INTRAMUSCULAR | Status: DC | PRN

## 2023-11-26 MED ORDER — FEBUXOSTAT 40 MG PO TABS
40.0000 mg | ORAL_TABLET | Freq: Every day | ORAL | Status: DC
  Administered 2023-11-27 – 2023-11-29 (×3): 40 mg via ORAL
  Filled 2023-11-26 (×3): qty 1

## 2023-11-26 MED ORDER — DOFETILIDE 250 MCG PO CAPS
250.0000 ug | ORAL_CAPSULE | Freq: Two times a day (BID) | ORAL | Status: DC
  Administered 2023-11-26 – 2023-11-29 (×6): 250 ug via ORAL
  Filled 2023-11-26 (×9): qty 1

## 2023-11-26 MED ORDER — SODIUM CHLORIDE 0.9 % IV SOLN
1.0000 g | Freq: Once | INTRAVENOUS | Status: AC
  Administered 2023-11-26: 1 g via INTRAVENOUS
  Filled 2023-11-26: qty 10

## 2023-11-26 MED ORDER — LACTATED RINGERS IV BOLUS
1000.0000 mL | Freq: Once | INTRAVENOUS | Status: AC
  Administered 2023-11-26: 1000 mL via INTRAVENOUS

## 2023-11-26 MED ORDER — ACETAMINOPHEN 325 MG PO TABS
650.0000 mg | ORAL_TABLET | Freq: Four times a day (QID) | ORAL | Status: DC | PRN
  Administered 2023-11-27: 650 mg via ORAL
  Filled 2023-11-26 (×2): qty 2

## 2023-11-26 MED ORDER — FLUTICASONE FUROATE-VILANTEROL 100-25 MCG/ACT IN AEPB
1.0000 | INHALATION_SPRAY | Freq: Every day | RESPIRATORY_TRACT | Status: DC
  Filled 2023-11-26 (×2): qty 28

## 2023-11-26 MED ORDER — GABAPENTIN 300 MG PO CAPS
300.0000 mg | ORAL_CAPSULE | Freq: Every day | ORAL | Status: DC
  Administered 2023-11-26 – 2023-11-28 (×3): 300 mg via ORAL
  Filled 2023-11-26 (×3): qty 1

## 2023-11-26 MED ORDER — MECLIZINE HCL 25 MG PO TABS
25.0000 mg | ORAL_TABLET | Freq: Once | ORAL | Status: AC
  Administered 2023-11-26: 25 mg via ORAL
  Filled 2023-11-26: qty 1

## 2023-11-26 MED ORDER — REVEFENACIN 175 MCG/3ML IN SOLN: 175.0000 ug | Freq: Every day | RESPIRATORY_TRACT | Status: DC

## 2023-11-26 MED ORDER — LEVALBUTEROL HCL 0.63 MG/3ML IN NEBU
0.6300 mg | INHALATION_SOLUTION | Freq: Four times a day (QID) | RESPIRATORY_TRACT | Status: DC | PRN
  Administered 2023-11-28: 0.63 mg via RESPIRATORY_TRACT
  Filled 2023-11-26: qty 3

## 2023-11-26 MED ORDER — SODIUM CHLORIDE 0.9% FLUSH
3.0000 mL | Freq: Two times a day (BID) | INTRAVENOUS | Status: DC
  Administered 2023-11-26 – 2023-11-29 (×6): 3 mL via INTRAVENOUS

## 2023-11-26 MED ORDER — SODIUM CHLORIDE 0.9 % IV SOLN
1.0000 g | INTRAVENOUS | Status: DC
  Administered 2023-11-27 – 2023-11-28 (×2): 1 g via INTRAVENOUS
  Filled 2023-11-26 (×2): qty 10

## 2023-11-26 MED ORDER — ALBUTEROL SULFATE (2.5 MG/3ML) 0.083% IN NEBU
2.5000 mg | INHALATION_SOLUTION | Freq: Four times a day (QID) | RESPIRATORY_TRACT | Status: DC
Start: 2023-11-26 — End: 2023-11-26

## 2023-11-26 MED ORDER — SPIRONOLACTONE 12.5 MG HALF TABLET
12.5000 mg | ORAL_TABLET | Freq: Every day | ORAL | Status: DC
  Administered 2023-11-27 – 2023-11-29 (×3): 12.5 mg via ORAL
  Filled 2023-11-26 (×3): qty 1

## 2023-11-26 NOTE — ED Provider Notes (Signed)
 [facility] AT [facility] Provider Note   CSN: [id number] Arrival date & time: 11/26/23  1357     History  No chief complaint on file.   Kathleen [name] is a 78 y.o. female with PMH as listed below who presents with dizziness. Patient reports hip and shoulder pain especially with sitting and standing. Has had injections for back pain.   Receiving percocets for back and hip pain. Got up this AM at 0530 and took a percocet. Has been taking those for pain but the dizziness today was different and very severe that started at 0930 or 1000 am. Couldn't even sit up when the ambulance came, she was so dizzy she kept falling backward onto the bed. Also endorses headache, which is very rare for her, 5/10, all of a sudden but not maximum at onset, now getting worse gradually. Denies visual changes, double vision/blurry vision, neck pain, f/c, palpitations, urinary sxs, N/V, abdominal pain, numbness/tingling, asymmetric weakness, slurred speech/word finding difficulty, head trauma/falls. Ostomy output is good. No falls/head trauma reported.  Does not note that the dizziness is necessarily better when she lies down.  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 Authorizing Provider   acetaminophen  (TYLENOL ) 500 MG tablet Take 1,000 mg by mouth as needed.    [provider]  Cholecalciferol  (VITAMIN D3) 50 MCG (2000 UT) TABS Take 2,000 Units by mouth at bedtime.    [provider]  cyanocobalamin  (VITAMIN B12) 1000 MCG tablet Take 1,000 mcg by mouth daily.    [provider]  docusate sodium  (COLACE) 100 MG capsule Take 100 mg by mouth daily.    [provider]  dofetilide  (TIKOSYN ) 250 MCG capsule Take 1 capsule (250 mcg total) by mouth in the morning and at bedtime. 07/23/23 07/22/24  Darlis Eisenmenger, MD  ELIQUIS  5 MG TABS tablet Take 1 tablet (5 mg total) by mouth 2 (two) times daily. 09/23/23   Bensimhon, Rheta Celestine, MD  febuxostat  (ULORIC ) 40 MG tablet Take 1 tablet (40 mg total) by mouth daily. 10/06/23   Rice, Haig Levan, MD  fluticasone  furoate-vilanterol (BREO ELLIPTA ) 100-25 MCG/ACT AEPB INHALE 1 PUFF INTO THE LUNGS DAILY 03/23/23   Lind Repine, MD  gabapentin  (NEURONTIN ) 300 MG capsule Take 1 capsule (300 mg total) by mouth at bedtime. 09/23/23 09/17/24  Cassandra Cleveland, MD  ketoconazole  (NIZORAL ) 2 % cream Apply 1 Application topically 2 (two) times daily. 10/21/23   Arcadio Knuckles, MD  levalbuterol  (XOPENEX ) 0.63 MG/3ML nebulizer solution Take 3 mLs (0.63 mg total) by nebulization every 6 (six) hours as needed for wheezing or shortness of breath. DX J44.89 J96.21 01/20/23   Lind Repine, MD  Magnesium  500 MG TABS Take  500 mg by mouth 2 (two) times daily.    [provider]  mupirocin  ointment (BACTROBAN ) 2 % Apply 1 Application topically 2 (two) times daily. 10/22/23   Arcadio Knuckles, MD  nystatin  (MYCOSTATIN /NYSTOP ) powder Apply topically 3 (three) times daily. 05/27/23   Singh, Prashant K, MD  oxyCODONE  (OXY IR/ROXICODONE ) 5 MG immediate release tablet Take 1-2 tablets (5-10 mg total) by mouth every 6 (six) hours as needed for moderate pain (pain score 4-6) or severe pain (pain score 7-10) (5mg  moderate, 10mg  severe).  05/27/23   Singh, Prashant K, MD  oxyCODONE  (OXY IR/ROXICODONE ) 5 MG immediate release tablet 1 po q 8-12hrs prn pain 10/18/23   Jasmine Mesi, MD  OXYGEN  Inhale 4 L into the lungs continuous. Use with resmed ventilator    [provider]  potassium chloride  (KLOR-CON ) 10 MEQ tablet TAKE 1 TABLET(10 MEQ) BY MOUTH THREE TIMES DAILY 11/15/23   Arcadio Knuckles, MD  revefenacin  (YUPELRI ) 175 MCG/3ML nebulizer solution Take 175 mcg by nebulization daily.    [provider]  spironolactone  (ALDACTONE ) 25 MG tablet Take 12.5 mg by mouth daily.    [provider]  torsemide  (DEMADEX ) 20 MG tablet Take 2 tablets (40 mg total) by mouth daily. 08/09/23   Arcadio Knuckles, MD  triamcinolone  cream (KENALOG ) 0.1 % Apply 1 Application topically 2 (two) times daily. 07/02/23   Trenton Frock, PA-C  Zinc  Oxide (DESITIN MAXIMUM STRENGTH) 40 % PSTE Apply 1 Application topically 4 (four) times daily. Mix with aquaphor and apply to inflamed area    [provider]      Allergies    Allopurinol, Clindamycin, Flublok [influenza vaccine recombinant], Pneumococcal 13-val conj vacc, Dronedarone, Montelukast , Brovana  [arformoterol ], Budesonide , Entresto  [sacubitril -valsartan ], Fosamax [alendronate sodium], Jardiance  [empagliflozin ], Meperidine, Microplegia msa-msg [plegisol], Rosuvastatin, Tetracycline, Adhesive [tape], and Lovastatin    Review of Systems   Review of Systems A 10 point review of systems was performed and is negative unless otherwise reported in HPI.  Physical Exam Updated Vital Signs BP (!) 165/83 (BP Location: Left Arm)   Pulse 71   Temp 98.2 F (36.8 C) (Oral)   Resp 12   SpO2 100%  Physical Exam General: Normal appearing female, lying in bed.  HEENT: PERRLA, EOMI, no nystagmus, Sclera anicteric, MMM, trachea midline.  Cardiology: RRR, no murmurs/rubs/gallops.  Resp: Normal respiratory rate and effort. CTAB, no wheezes, rhonchi, crackles.  Abd: Soft,  non-tender, non-distended. No rebound tenderness or guarding.  Ostomy on abdomen with brown stool in bag. GU: Deferred. MSK: No peripheral edema or signs of trauma. Extremities without deformity or TTP. No cyanosis or clubbing. Skin: warm, dry.  Back: No CVA tenderness Neuro: A&Ox4, CNs II-XII grossly intact. MAEs. Sensation grossly intact.  Psych: Normal mood and affect.   ED Results / Procedures / Treatments   Labs (all labs ordered are listed, but only abnormal results are displayed) Labs Reviewed  COMPREHENSIVE METABOLIC PANEL WITH GFR - Abnormal; Notable for the following components:      Result Value   Glucose, Bld 104 (*)    Total Protein 6.2 (*)    AST 14 (*)    All other components within normal limits  CBC - Abnormal; Notable for the following components:   RBC 5.14 (*)    RDW 15.8 (*)    All other components within normal limits  URINALYSIS, ROUTINE W REFLEX MICROSCOPIC - Abnormal; Notable for the following components:   APPearance CLOUDY (*)    Hgb urine dipstick  MODERATE (*)    Leukocytes,Ua LARGE (*)    Bacteria, UA RARE (*)    All other components within normal limits  CBG MONITORING, ED - Abnormal; Notable for the following components:   Glucose-Capillary 101 (*)    All other components within normal limits  URINE CULTURE    EKG EKG Interpretation Date/Time:  Friday November 26 2023 14:16:03 EDT Ventricular Rate:  76 PR Interval:    QRS Duration:  141 QT Interval:  445 QTC Calculation: 501 R Axis:   -67  Text Interpretation: AV dual paced rhythm Baseline wander in lead(s) II III aVF Confirmed by Annita Kindle [phone]) on 11/26/2023 3:19:04 PM  Radiology CT Head Wo Contrast Result Date: 11/26/2023 EXAM: CT HEAD WITHOUT CONTRAST 11/26/2023 03:51:28 PM TECHNIQUE: CT of the head was performed without the administration of intravenous contrast. Automated exposure control, iterative reconstruction, and/or weight based adjustment of the mA/kV was utilized to reduce  the radiation dose to as low as reasonably achievable. COMPARISON: 03/17/2022 CLINICAL HISTORY: Headache, increasing frequency or severity; new headaches, vertigo. Pt arrives via EMS from [facility]. Pt reports sudden onset of dizziness that started around 1200 today. Pt also c/o of headache. States the dizziness worsens when she moves her head or changes positions. Pt arrives AxOx4. FINDINGS: BRAIN AND VENTRICLES: No acute hemorrhage. Gray-white differentiation is preserved. No hydrocephalus. No extra-axial collection. No mass effect or midline shift. Unchanged mild generalized volume loss within the expected range for patient age. ORBITS: No acute abnormality. SINUSES: No acute abnormality. SOFT TISSUES AND SKULL: No acute soft tissue abnormality. No skull fracture. IMPRESSION: 1. No acute intracranial abnormality. 2. Unchanged mild generalized volume loss within the expected range for patient age. Electronically signed by: Audra Blend MD 11/26/2023 04:08 PM EDT RP Workstation: [id number]    Procedures Procedures    Medications Ordered in ED Medications  lactated ringers  bolus 1,000 mL (has no administration in time range)  cefTRIAXone  (ROCEPHIN ) 1 g in sodium chloride  0.9 % 100 mL IVPB (1 g Intravenous New Bag/Given 11/26/23 1614)  meclizine  (ANTIVERT ) tablet 25 mg (25 mg Oral Given 11/26/23 1440)  acetaminophen  (TYLENOL ) tablet 1,000 mg (1,000 mg Oral Given 11/26/23 1543)  metoCLOPramide  (REGLAN ) injection 10 mg (10 mg Intravenous Given 11/26/23 1538)    ED Course/ Medical Decision Making/ A&P                          Medical Decision Making Amount and/or Complexity of Data Reviewed Labs: ordered. Decision-making details documented in ED Course. Radiology: ordered. Decision-making details documented in ED Course.  Risk OTC drugs. Prescription drug management. Decision regarding hospitalization.    This patient presents to the ED for concern of dizziness, headaches; this  involves an extensive number of treatment options, and is a complaint that carries with it a high risk of complications and morbidity.  I considered the following differential and admission for this acute, potentially life threatening condition. HDS and non-toxic appearing, NAD.   MDM:    Patient with dizziness that worsens with head movement and looking around the room.  She has no significant nystagmus on exam even when her symptoms are actively occurring, cannot perform hints exam.  She has no other focal neurodeficits, and especially that it is worse with head movement and not constant, lower concern for central cause of dizziness.  She did complain of a headache, she is not normally a headache person, does have a history of a subarachnoid  bleed but no significant head trauma but obtained a CT head which was reassuring, did not demonstrate any ICH or NPH.  Patient has no anemia or significant electrolyte derangements but is noted to have a UTI which could be contributing to her symptoms.  She does not know that her symptoms seem to be orthostatic and she has had good intake and no significant increase in ostomy output.  No abdominal pain, nausea vomiting diarrhea or GI losses.  No chest pain palpitations, she is paced on her EKG due to history of complete heart block, will interrogate pacemaker.   Clinical Course as of 11/26/23 1629  Fri Nov 26, 2023  1503 Glucose-Capillary(!): 101 [HN]  1511 Hemoglobin: 13.4 No anemia [HN]  1511 WBC: 9.8 No leukocytosis [HN]  1555 Urinalysis, Routine w reflex microscopic -Urine, Clean Catch(!) +UTI, will give ceftriaxone , no helpful prior culture data.  Urine culture drawn today. [HN]  1612 CT Head Wo Contrast 1. No acute intracranial abnormality. 2. Unchanged mild generalized volume loss within the expected range for patient age   [HN]  53 Discussed with patient who states she lives alone as she is too significantly dizzy to get up and move around by  herself.  Receiving fluids as well.  Will admit to medicine. [HN]    Clinical Course User Index [HN] Merdis Stalling, MD    Labs: I Ordered, and personally interpreted labs.  The pertinent results include:  those listed above  Imaging Studies ordered: I ordered imaging studies including CTH I independently visualized and interpreted imaging. I agree with the radiologist interpretation  Additional history obtained from chart review.   Cardiac Monitoring: The patient was maintained on a cardiac monitor.  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    Concussion 10/04/2021   COPD (chronic obstructive pulmonary disease) (HCC)    Diverticulitis    Gout    Hypertension    Longstanding persistent atrial fibrillation (HCC)    on Xarelto    Nonischemic cardiomyopathy (HCC)    Obesity    Pacemaker    Spontaneous pneumothorax 2013     Medicines Meds ordered this encounter  Medications   meclizine  (ANTIVERT ) tablet 25 mg   acetaminophen  (TYLENOL ) tablet 1,000 mg   lactated ringers  bolus 1,000 mL   metoCLOPramide  (REGLAN ) injection 10 mg   cefTRIAXone  (ROCEPHIN ) 1 g in sodium chloride  0.9 % 100 mL IVPB    Antibiotic Indication::   UTI    I have reviewed the patients home medicines and have made adjustments as needed  Problem List / ED Course: Problem List Items Addressed This Visit   None Visit Diagnoses       Dizziness     -  Primary     Acute cystitis without hematuria                       This note was created using dictation software, which may contain spelling or  grammatical errors.    Merdis Stalling, MD 11/26/23 [phone]

## 2023-11-26 NOTE — ED Notes (Signed)
 Notified in care handoff to have patients pacemaker interrogated

## 2023-11-26 NOTE — ED Notes (Signed)
 Pacemaker interrogated.

## 2023-11-26 NOTE — ED Notes (Signed)
 Ccmd called

## 2023-11-26 NOTE — ED Notes (Signed)
 Received report.  Pt transferred over, placed back on the monitor. Reports some continued dizziness, especially with movement. Also reports a mild headache. Repositioned in the bed for comfort. Son at bedside. Denies further needs. Call bell within reach.

## 2023-11-26 NOTE — ED Notes (Signed)
 Lab to add BNP onto existing blood.

## 2023-11-26 NOTE — H&P (Signed)
 History and Physical    Patient: Kathleen [name] ZOX:[id number] DOB: 04/12/1946 DOA: 11/26/2023 DOS: the patient was seen and examined on 11/26/2023 PCP: Arcadio Knuckles, MD  Patient coming from: Home via EMS  Chief Complaint: Dizziness  HPI: MAYSIE PARKHILL is a 78 y.o. 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  Dizziness and headache began at approximately 10 AM today. The headache is diffuse, located across the frontal region, and the dizziness is described as lightheadedness. She took her morning medication, which includes oxycodone  5 mg and acetaminophen  325 mg, at 5:30 AM, but does not believe this caused the symptoms as they started later in the morning.  She experiences occasional chest pains at night, which are relieved by drinking water. No leg swelling is noted. Persistent hip pain is managed with oxycodone  with acetaminophen .  She has urinary urgency with minimal output, attributed to a urinary tract infection diagnosed previously. She has a raspy cough and some shortness of breath, which is worse than normal. She is on diuretics for fluid retention, with a recent change in dosage to one pill in the morning and one at night, occurring a few days ago.  No fevers or chills are reported. She denies discomfort with urination, increased frequency of urination, and leg swelling.  Upon admission to the emergency department patient was noted to be afebrile with blood pressures maintained.  Orthostatic vital signs also were obtained and reassuring.  Labs were relatively unremarkable.  CT scan of the head did not reveal any acute abnormality.   Urinalysis was positive for large leukocytes with rare bacteria, and greater than 50 WBCs.  Patient was given Tylenol  1000 mg p.o., 1 L of lactated Ringer 's, meclizine , Reglan , and Rocephin  IV.  Review of Systems: As mentioned in the history of present illness. All other systems reviewed and are negative. 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    Concussion 10/04/2021   COPD (chronic obstructive pulmonary disease) (HCC)    Diverticulitis    Gout    Hypertension    Longstanding persistent atrial fibrillation (HCC)    on Xarelto    Nonischemic cardiomyopathy (HCC)    Obesity    Pacemaker    Spontaneous pneumothorax 2013   Past Surgical History:  Procedure Laterality Date   ABDOMINAL HYSTERECTOMY     APPENDECTOMY     APPLICATION OF WOUND VAC  04/27/2023   Procedure: APPLICATION OF WOUND VAC;  Surgeon: Enid Harry, MD;  Location: [facility] OR;  Service: General;;   ATRIAL FIBRILLATION ABLATION  07/20/2013   by Dr Ginny Lair   AV nodal ablation  11/01/2013   by Dr Ginny Lair, repeated by Dr Alvena Jo   BREAST BIOPSY Bilateral 1997   negative   CARDIAC CATHETERIZATION     CHOLECYSTECTOMY     COLON RESECTION SIGMOID N/A 04/27/2023   Procedure: COLON RESECTION SIGMOID WITH COLOSTOMY;  Surgeon: Enid Harry, MD;  Location: [facility] OR;  Service: General;  Laterality: N/A;   ESOPHAGOGASTRODUODENOSCOPY (EGD) WITH PROPOFOL  N/A 05/24/2023  Procedure: ESOPHAGOGASTRODUODENOSCOPY (EGD) WITH PROPOFOL ;  Surgeon: Nannette Babe, MD;  Location: [facility] ENDOSCOPY;  Service: Gastroenterology;  Laterality: N/A;   HERNIA REPAIR     LAPAROTOMY N/A 04/27/2023   Procedure: EXPLORATORY LAPAROTOMY;  Surgeon: Enid Harry, MD;  Location: [facility] OR;  Service: General;  Laterality: N/A;   PACEMAKER INSERTION  06/2017   MDT  Viva CRT-P implanted by Dr Ginny Lair after AV nodal ablation,  LV lead could not be placed   PACEMAKER INSERTION  05/2020   with lead bundle   RIGHT/LEFT HEART CATH AND CORONARY ANGIOGRAPHY N/A 03/20/2022   Procedure: RIGHT/LEFT HEART CATH AND CORONARY ANGIOGRAPHY;  Surgeon: Swazil[place], Peter M, MD;  Location: [facility] INVASIVE CV LAB;  Service: Cardiovascular;  Laterality: N/A;   Social History:  reports that she quit smoking about 36 years ago. Her smoking use included cigarettes. She started smoking about 56 years ago. She has a 20 pack-year smoking history. She has been exposed to tobacco smoke. She has never used smokeless tobacco. She reports that she does not drink alcohol and does not use drugs.  Allergies  Allergen Reactions   Allopurinol Other (See Comments)    Reaction:  Dizziness    Clindamycin Anaphylaxis and Hives   Flublok [Influenza Vaccine Recombinant] Other (See Comments)    Fever 103 with no alternative explanation day after vaccine.  Mariah Shines Highfill FNP-C   Pneumococcal 13-Val Conj Vacc Itching, Swelling and Rash   Dronedarone Rash   Montelukast  Other (See Comments)    Makes her loopy.   Brovana  [Arformoterol ]     Caused muscle pain   Budesonide      Caused extreme joint pain   Entresto  [Sacubitril -Valsartan ] Other (See Comments)    hypotension   Fosamax [Alendronate Sodium] Nausea Only   Jardiance  [Empagliflozin ]     Caused a vaginal infection   Meperidine Nausea And Vomiting   Microplegia Msa-Msg [Plegisol]     Loopy,diarrhea   Rosuvastatin Other (See Comments)    Reaction:  Muscle spasms    Tetracycline Hives   Adhesive [Tape] Rash   Lovastatin Rash and Other (See Comments)    Muscle Pain    Family History  Problem Relation Age of Onset   Breast cancer Mother 8       3 different times   Pancreatic cancer Mother    Emphysema Father    Healthy Brother    Breast cancer Cousin    Valvular heart disease Son    Obesity Daughter    Colon cancer Neg Hx     Esophageal cancer Neg Hx    Stomach cancer Neg Hx    Inflammatory bowel disease Neg Hx    Liver disease Neg Hx    Rectal cancer Neg Hx     Prior to Admission medications   Medication Sig Start Date End Date Taking? Authorizing Provider  acetaminophen  (TYLENOL ) 500 MG tablet Take 1,000 mg by mouth as needed.    [provider]  Cholecalciferol  (VITAMIN D3) 50 MCG (2000 UT) TABS Take 2,000 Units by mouth at bedtime.    [provider]  cyanocobalamin  (VITAMIN B12) 1000 MCG tablet Take 1,000 mcg by mouth daily.    [provider]  docusate sodium  (COLACE) 100 MG capsule Take 100 mg by mouth daily.    [provider]  dofetilide  (TIKOSYN ) 250 MCG capsule Take 1 capsule (250 mcg total) by mouth in the morning and at bedtime. 07/23/23 07/22/24  Darlis Eisenmenger, MD  ELIQUIS  5 MG TABS tablet Take  1 tablet (5 mg total) by mouth 2 (two) times daily. 09/23/23   Bensimhon, Rheta Celestine, MD  febuxostat  (ULORIC ) 40 MG tablet Take 1 tablet (40 mg total) by mouth daily. 10/06/23   Rice, Haig Levan, MD  fluticasone  furoate-vilanterol (BREO ELLIPTA ) 100-25 MCG/ACT AEPB INHALE 1 PUFF INTO THE LUNGS DAILY 03/23/23   Lind Repine, MD  gabapentin  (NEURONTIN ) 300 MG capsule Take 1 capsule (300 mg total) by mouth at bedtime. 09/23/23 09/17/24  Cassandra Cleveland, MD  ketoconazole  (NIZORAL ) 2 % cream Apply 1 Application topically 2 (two) times daily. 10/21/23   Arcadio Knuckles, MD  levalbuterol  (XOPENEX ) 0.63 MG/3ML nebulizer solution Take 3 mLs (0.63 mg total) by nebulization every 6 (six) hours as needed for wheezing or shortness of breath. DX J44.89 J96.21 01/20/23   Lind Repine, MD  Magnesium  500 MG TABS Take 500 mg by mouth 2 (two) times daily.    [provider]  mupirocin  ointment (BACTROBAN ) 2 % Apply 1 Application topically 2 (two) times daily. 10/22/23   Arcadio Knuckles, MD  nystatin  (MYCOSTATIN /NYSTOP ) powder Apply topically 3 (three) times daily. 05/27/23   Singh,  Prashant K, MD  oxyCODONE  (OXY IR/ROXICODONE ) 5 MG immediate release tablet Take 1-2 tablets (5-10 mg total) by mouth every 6 (six) hours as needed for moderate pain (pain score 4-6) or severe pain (pain score 7-10) (5mg  moderate, 10mg  severe). 05/27/23   Singh, Prashant K, MD  oxyCODONE  (OXY IR/ROXICODONE ) 5 MG immediate release tablet 1 po q 8-12hrs prn pain 10/18/23   Jasmine Mesi, MD  OXYGEN  Inhale 4 L into the lungs continuous. Use with resmed ventilator    [provider]  potassium chloride  (KLOR-CON ) 10 MEQ tablet TAKE 1 TABLET(10 MEQ) BY MOUTH THREE TIMES DAILY 11/15/23   Arcadio Knuckles, MD  revefenacin  (YUPELRI ) 175 MCG/3ML nebulizer solution Take 175 mcg by nebulization daily.    [provider]  spironolactone  (ALDACTONE ) 25 MG tablet Take 12.5 mg by mouth daily.    [provider]  torsemide  (DEMADEX ) 20 MG tablet Take 2 tablets (40 mg total) by mouth daily. 08/09/23   Arcadio Knuckles, MD  triamcinolone  cream (KENALOG ) 0.1 % Apply 1 Application topically 2 (two) times daily. 07/02/23   Trenton Frock, PA-C  Zinc  Oxide (DESITIN MAXIMUM STRENGTH) 40 % PSTE Apply 1 Application topically 4 (four) times daily. Mix with aquaphor and apply to inflamed area    [provider]    Physical Exam: Vitals:   11/26/23 1400 11/26/23 1408  BP:  (!) 165/83  Pulse:  71  Resp:  12  Temp:  98.2 F (36.8 C)  TempSrc:  Oral  SpO2: 100% 100%   Constitutional: Elderly female currently in no acute distress Eyes: PERRL, lids and conjunctivae normal ENMT: Mucous membranes are moist.  Normal dentition.  Neck: normal, supple  Respiratory: clear to auscultation bilaterally, no wheezing, no crackles. Normal respiratory effort. No accessory muscle use.  Cardiovascular: Regular rate and rhythm, no murmurs / rubs / gallops.     No carotid bruits.  Abdomen: no tenderness, no masses palpated.  Colostomy present. Musculoskeletal: no clubbing / cyanosis. No joint deformity  upper and lower extremities. Good ROM, no contractures. Normal muscle tone.  Skin: no rashes, lesions, ulcers. No induration Neurologic: CN 2-12 grossly intact. Sensation intact, DTR normal. Strength 5/5 in all 4.  Psychiatric: Normal judgment and insight. Alert and oriented x 3. Normal mood.   Data Reviewed:  EKG revealed atrial fibrillation at  76 bpm with QTc 501.  Reviewed labs, imaging, and pertinent records as documented.  Assessment and Plan:  Dizziness Patient presents with complaints of dizziness and headache which she describes as lightheadedness for which she called EMS this morning.  Patient reported having recent increase in furosemide  to 20 mg twice daily.  Note from 6/16 note patient was supposed to try furosemide  20 mg twice a day every other day for 1 week.  Orthostatic vital signs were noted to be stable.  CT imaging of the brain have been done which revealed no signs for a stroke.  Patient had been ordered 1 L normal saline IV fluids. - Admit to a medical telemetry bed - IV fluid bolus adjusted to 500 mL - PT to eval and treat  Urinary tract infection Acute.  Patient did report urinary urgency with hesitancy.  Urinalysis noted large leukocytes with rare bacteria and greater than 50 WBCs.  Patient was started on Rocephin  IV. - Follow-up urine culture - Continue empiric antibiotics of Rocephin   Chronic respiratory failure with hypoxia COPD Boop She reports having a nonproductive cough that is slightly worse than baseline..  Patient on 3-4 L of nasal cannula oxygen  at baseline. Patient without significant wheezes or rhonchi present on physical exam. - Check chest x-ray - Continue Breo Ellipta   - levalbuterol  nebs as needed for shortness of breath or wheezing  History of AV node ablation s/p PPM Medtronic - Follow-up interrogation of pacemaker.  Formally consult cardiology if abnormality noted.  Persistent atrial fibrillation on chronic anticoagulation Patient in  atrial fibrillation but currently rate controlled. - Continue Tikosyn  and Eliquis   Heart failure with reduced ejection fraction Chronic.  Patient does not appear grossly fluid overloaded on physical exam.  Patient notes recent increase in torsemide .  Last echocardiogram noted EF to be 35 to 40% with grade 1 diastolic dysfunction when checked/02/2023. - Add on BNP - Strict I&Os daily weights - Reassess and determine possible need of adjustment of diuretic dosing in a.m. if symptoms  Hip pain - Continue oxycodone  as needed  History of diverticulitis S/p colostomy Patient with a history of diverticulitis and colovaginal fistula with perforation requiring exploratory laparotomy with sigmoid colectomy and end ostomy bag on 04/27/2023. - Continue ostomy care  GERD - Continue current medication regimen  DVT prophylaxis: Eliquis  Advance Care Planning:   Code Status: Full Code.  Discussed with patient and son at bedside.  Consults: None  Family Communication: Son updated at bedside  Severity of Illness: The appropriate patient status for this patient is OBSERVATION. Observation status is judged to be reasonable and necessary in order to provide the required intensity of service to ensure the patient's safety. The patient's presenting symptoms, physical exam findings, and initial radiographic and laboratory data in the context of their medical condition is felt to place them at decreased risk for further clinical deterioration. Furthermore, it is anticipated that the patient will be medically stable for discharge from the hospital within 2 midnights of admission.   Author: Lena Qualia, MD 11/26/2023 4:38 PM  For on call review [email or web address].

## 2023-11-26 NOTE — ED Triage Notes (Signed)
 Pt arrives via EMS from [facility]. Pt reports sudden onset of dizziness that started around 1200 today. Pt also c/o of headache. States the dizziness worsens when she moves her head or changes positions. Pt arrives AxOx4.

## 2023-11-27 DIAGNOSIS — N3000 Acute cystitis without hematuria: Secondary | ICD-10-CM | POA: Diagnosis not present

## 2023-11-27 LAB — MAGNESIUM: Magnesium: 2 mg/dL (ref 1.7–2.4)

## 2023-11-27 LAB — BASIC METABOLIC PANEL WITH GFR
Anion gap: 8 (ref 5–15)
BUN: 18 mg/dL (ref 8–23)
CO2: 28 mmol/L (ref 22–32)
Calcium: 9.2 mg/dL (ref 8.9–10.3)
Chloride: 106 mmol/L (ref 98–111)
Creatinine, Ser: 0.59 mg/dL (ref 0.44–1.00)
GFR, Estimated: >60 mL/min (ref >60)
Glucose, Bld: 99 mg/dL (ref 70–99)
Potassium: 3.6 mmol/L (ref 3.5–5.1)
Sodium: 142 mmol/L (ref 135–145)

## 2023-11-27 LAB — CBC
HCT: 38.3 % (ref 36.0–46.0)
Hemoglobin: 11.9 g/dL — ABNORMAL LOW (ref 12.0–15.0)
MCH: 25.8 pg — ABNORMAL LOW (ref 26.0–34.0)
MCHC: 31.1 g/dL (ref 30.0–36.0)
MCV: 82.9 fL (ref 80.0–100.0)
Platelets: 174 10*3/uL (ref 150–400)
RBC: 4.62 MIL/uL (ref 3.87–5.11)
RDW: 15.7 % — ABNORMAL HIGH (ref 11.5–15.5)
WBC: 6.4 10*3/uL (ref 4.0–10.5)
nRBC: 0 % (ref 0.0–0.2)

## 2023-11-27 MED ORDER — POTASSIUM CHLORIDE CRYS ER 20 MEQ PO TBCR
40.0000 meq | EXTENDED_RELEASE_TABLET | Freq: Once | ORAL | Status: AC
  Administered 2023-11-27: 40 meq via ORAL
  Filled 2023-11-27: qty 2

## 2023-11-27 NOTE — Progress Notes (Signed)
 Progress Note   Patient: Kathleen [name] FMW:[id number] DOB: Sep 18, 1945 DOA: 11/26/2023     0 DOS: the patient was seen and examined on 11/27/2023   Brief hospital course: 78 y.o. 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 Dizziness is likely secondary to recent increase in her torsemide  dose.   Assessment and Plan:  Dizziness: Likely BPPV. Epley maneuvre done Note from Cardiology RN 6/16 states that patient was supposed to try furosemide  20 mg twice a day every other day for 1 week.  Orthostatic vital signs were noted to be stable.  CT imaging of the brain have been done which revealed no signs for a stroke.  Patient has received 1 L normal saline IV fluids. - PT to eval and treat -will restart at 20 mg daily instead of BID.   Possible acute uncomplicated Urinary tract infection Symptoms  include urinary urgency with hesitancy.  Urinalysis noted large leukocytes with rare bacteria and greater than 50 WBCs.  Patient was started on Rocephin  IV. - Follow-up urine culture - Continue empiric antibiotics of Rocephin    Chronic respiratory failure with hypoxia,POA:  COPD Boop She reports having a nonproductive cough that is slightly worse than baseline..  Patient on 3-4 L of nasal cannula oxygen  at baseline. Patient without significant wheezes or rhonchi present on physical exam. - f/u CXR: Cardiomegaly but no acute issues - Continue Breo Ellipta   - levalbuterol  nebs as needed for shortness of breath or wheezing   History of AV node ablation s/p PPM [employer]tr[employer] - Follow-up interrogation of pacemaker.  Formally  consult cardiology if abnormality noted.   Long standing Persistent atrial fibrillation on chronic anticoagulation Patient in atrial fibrillation but currently rate controlled. 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 - Strict I&Os daily weights - Reassess and determine possible need of adjustment of diuretic dosing in a.m. 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 -Not a candidate for hip replacements in the setting of multiple comorbidities -f/u hip xrays   History of diverticulitis S/p colostomy Patient with a history of diverticulitis and colovaginal fistula with perforation requiring exploratory laparotomy with sigmoid colectomy and end ostomy bag on 04/27/2023. - Continue ostomy care   GERD - Continue current medication regimen   DVT prophylaxis: Eliquis      Subjective: She said she got dizzy yesterday and called EMS. Her dizziness has resolved now after she got IVF overnight. She lives at [facility].Her torsemide  dose was recently increased to 20 mg BID and she thinks this causes her to have dizziness.  Physical Exam: Vitals:   11/26/23 2030 11/26/23 2308 11/27/23 0454 11/27/23 0751  BP:  (!) 138/54  129/61  Pulse:  73  70  Resp:  18    Temp:  97.6 F (36.4 C)  97.6 F (36.4 C)  TempSrc:  Oral  Oral  SpO2:  99%  100%  Weight: 113.1 kg  (P) 112.7 kg   Height: 5' (1.524 m)      Constitutional: NAD, calm, comfortable, nasal oxygen  cannula in place Eyes: PERRL, lids and conjunctivae normal ENMT: Mucous membranes are moist. Posterior  pharynx clear of any exudate or lesions.Normal dentition.  Neck: normal, supple, no masses, no thyromegaly Respiratory: clear to auscultation bilaterally, no wheezing, no crackles. Normal respiratory effort. No accessory muscle use.  Cardiovascular: Regular rate and rhythm, no murmurs / rubs / gallops. No extremity edema. 2+ pedal pulses. No carotid bruits.  Abdomen: no tenderness, no masses palpated. No hepatosplenomegaly. Bowel sounds positive. Colostomy in place Musculoskeletal: no clubbing / cyanosis. No joint deformity upper and lower extremities. Good ROM, no contractures. Normal muscle tone.  Skin: no rashes, lesions, ulcers. No induration Neurologic: CN 2-12 grossly intact. Sensation intact, DTR normal. Strength 5/5 x all 4 extremities.  Psychiatric: Normal judgment and insight. Alert and oriented x 3. Normal mood.   Data Reviewed:  There are no new results to review at this time.  Family Communication: None present at bedside  Disposition: Status is: Observation The patient remains OBS appropriate and will d/c before 2 midnights.  Planned Discharge Destination: Home with Home Health    Time spent: 40 minutes  Author: Deliliah Room, MD 11/27/2023 10:01 AM  For on call review [email or web address].

## 2023-11-27 NOTE — Care Management Obs Status (Signed)
 MEDICARE OBSERVATION STATUS NOTIFICATION   Patient Details  Name: Kathleen [name] MRN: [id number] Date of Birth: 02/04/1946   Medicare Observation Status Notification Given:  Yes    Marval Gell, RN 11/27/2023, 2:41 PM

## 2023-11-27 NOTE — Evaluation (Signed)
 Physical Therapy Evaluation (Vestibular Note attached) Patient Details Name: Kathleen [name] MRN: [id number] DOB: March 29, 1946 Today's Date: 11/27/2023  History of Present Illness  78 y.o. 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   Clinical Impression  Pt admitted with above diagnosis. Independent at ILF prior to arrival. Developed dizziness yesterday after lying down to speak with her daughter on the phone. Symptoms improved but still present today. No resting nystagmus or other central featured identified during exam. 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 Concordant symptoms that prompter her to the ED were reproduced with [facility] positioning. Positive for BPPV (Right posterior canalithiasis). Treated with Epley Maneuver x2. Upbeat torsional nystagmus resolved 1st attempt, symptoms greatly improved 2nd round. Education completed. Agreeable to HHPT vestibular follow-up. Ambulates in room with RW, guarded (common after this treatment.) Agreeable to AD use at home for increased safety. Pt currently with functional limitations due to the deficits listed below (see PT Problem List). Pt will benefit from acute skilled PT to increase their independence and safety with mobility to allow discharge.           If plan is discharge home, recommend the following: Assist for transportation   Can travel by private vehicle        Equipment Recommendations None recommended by PT  Recommendations for Other Services       Functional Status Assessment Patient has had a recent decline in their functional status and demonstrates the  ability to make significant improvements in function in a reasonable and predictable amount of time.     Precautions / Restrictions Precautions Precautions: Fall Recall of Precautions/Restrictions: Intact Restrictions Weight Bearing Restrictions Per Provider Order: No      Mobility  Bed Mobility Overal bed mobility: Needs Assistance Bed Mobility: Rolling, Sidelying to Sit Rolling: Supervision Sidelying to sit: Min assist, Used rails       General bed mobility comments: supervision to roll. Min assist to rise to EOB x2 following epley maneuver.    Transfers Overall transfer level: Needs assistance Equipment used: Rolling walker (2 wheels) Transfers: Sit to/from Stand, Bed to chair/wheelchair/BSC Sit to Stand: Supervision   Step pivot transfers: Modified independent (Device/Increase time)       General transfer comment: Supervision for safety, using back of legs against bed frame first trial. Cues for technique hand placement and safety. More stable second round. Able to step pivot transfer with mod I using RW to and from BSC>bed.    Ambulation/Gait Ambulation/Gait assistance: Supervision Gait Distance (Feet): 25 Feet (+20) Assistive device: Rolling walker (2 wheels) Gait Pattern/deviations: Step-through pattern, Decreased stride length Gait velocity: dec Gait velocity interpretation: <1.31 ft/sec, indicative of household ambulator   General Gait Details: Fairly rigid but without evidence of LOB whie using RW for support. Does not feel confident without AD and is agreeable to use RW/Rollator at d/c. Educated on safety and awareness.  Stairs            Wheelchair Mobility     Tilt Bed    Modified Rankin (Stroke Patients Only)       Balance Overall balance assessment: Needs assistance Sitting-balance support: No upper extremity supported, Feet supported Sitting balance-Leahy Scale: Good  Standing balance support: Bilateral upper extremity  supported Standing balance-Leahy Scale: Poor                               Pertinent Vitals/Pain Pain Assessment Pain Assessment: No/denies pain    Home Living Family/patient expects to be discharged to:: Private residence Living Arrangements: Alone Available Help at Discharge: Family;Available PRN/intermittently Type of Home: Independent living facility Home Access: Elevator       Home Layout: One level Home Equipment: Rollator (4 wheels);Cane - single point;Shower seat;Grab bars - tub/shower;Hand held shower head;Other (comment);Adaptive equipment;BSC/3in1 Additional Comments: 4L O2 baseline    Prior Function Prior Level of Function : Independent/Modified Independent             Mobility Comments: rollator outdoors ADLs Comments: ind     Extremity/Trunk Assessment   Upper Extremity Assessment Upper Extremity Assessment: Defer to OT evaluation    Lower Extremity Assessment Lower Extremity Assessment: Generalized weakness       Communication   Communication Communication: No apparent difficulties    Cognition Arousal: Alert Behavior During Therapy: WFL for tasks assessed/performed   PT - Cognitive impairments: No apparent impairments                         Following commands: Intact       Cueing Cueing Techniques: Verbal cues     General Comments General comments (skin integrity, edema, etc.): See vestibular attachment. Signs consistent with Rt posterior canalithiasis. Educated on findings, signs/symptoms, and follow-up recs.    Exercises Other Exercises Other Exercises: Canalith repositioning maneuver x2 with resolution of nystamgus after first round, and significant improvement in symptoms after second round.   Assessment/Plan    PT Assessment Patient needs continued PT services  PT Problem List Decreased strength;Decreased activity tolerance;Decreased balance;Decreased mobility;Obesity       PT Treatment  Interventions DME instruction;Gait training;Functional mobility training;Therapeutic activities;Therapeutic exercise;Balance training;Neuromuscular re-education;Patient/family education    PT Goals (Current goals can be found in the Care Plan section)  Acute Rehab PT Goals Patient Stated Goal: Feel better return home PT Goal Formulation: With patient Time For Goal Achievement: 12/10/23 Potential to Achieve Goals: Good    Frequency Min 2X/week     Co-evaluation               AM-PAC PT 6 Clicks Mobility  Outcome Measure Help needed turning from your back to your side while in a flat bed without using bedrails?: A Little Help needed moving from lying on your back to sitting on the side of a flat bed without using bedrails?: A Little Help needed moving to and from a bed to a chair (including a wheelchair)?: A Little Help needed standing up from a chair using your arms (e.g., wheelchair or bedside chair)?: A Little Help needed to walk in hospital room?: A Little Help needed climbing 3-5 steps with a railing? : A Lot 6 Click Score: 17    End of Session Equipment Utilized During Treatment: Oxygen  Activity Tolerance: Patient tolerated treatment well Patient left: in chair;with call bell/phone within reach;with chair alarm set   PT Visit Diagnosis: Unsteadiness on feet (R26.81);BPPV;Dizziness and giddiness (R42);Other abnormalities of gait and mobility (R26.89) BPPV - Right/Left : Right    Time: 8854-8756 PT Time Calculation (min) (ACUTE ONLY): 58 min   Charges:   PT Evaluation $PT Eval Low Complexity: 1 Low PT Treatments $Gait Training: 8-22  mins $Therapeutic Activity: 8-22 mins $Canalith Rep Proc: 8-22 mins PT General Charges $$ ACUTE PT VISIT: 1 Visit       Vestibular Assessment:   11/27/23 0001  Vestibular Assessment  General Observation Resting comfortably in bed. Head movements slightly guarded.  Symptom Behavior  Subjective history of current problem  Patient states she laid down to speak with her daughter on the phone yesterday and she developed severe dizziness. It has gotten better since taking medications given in the ED per pt report but still present.  Type of Dizziness  Imbalance  Frequency of Dizziness With movement.  Duration of Dizziness On going since yesterday but somewhat better today. Severe symptoms are brief.  Symptom Nature Positional;Constant  Aggravating Factors Lying supine;Turning head quickly;Supine to sit  Relieving Factors No known relieving factors  Progression of Symptoms Better  History of similar episodes Recall history of BPPV  Oculomotor Exam  Oculomotor Alignment Normal  Spontaneous Absent  Smooth Pursuits Intact (vertical saccades only)  Saccades Overshoots (vertical tracking only)  Oculomotor Exam-Fixation Suppressed   Spontaneous Nystagmus absent  Gaze evoked nystagmus absent  Left Head Impulse positive  Right Head Impulse positive  Auditory  Comments Declines any auditory changes, no tinnitus.  Positional Testing  Dix-Hallpike Dix-Hallpike Right;Dix-Hallpike Left  Dix-Hallpike Right  Dix-Hallpike Right Duration symptomatic with + Rt torsional upbeat nystagmus lasting approx 15 seconds.  Dix-Hallpike Right Symptoms Upbeat, right rotatory nystagmus  Dix-Hallpike Left  Dix-Hallpike Left Duration negative  Dix-Hallpike Left Symptoms No nystagmus  Cognition  Cognition Orientation Level Oriented x 4  Positional Sensitivities  Supine to Sitting 3  Right Hallpike 4  Up from Left Hallpike 1  Orthostatics  Orthostatics Comment Negative in ED.     Leontine Roads, PT, DPT [facility]  Rehabilitation Services Physical Therapist Office: [phone] Website: [email or web address]   Leontine GORMAN Roads 11/27/2023, 3:49 PM

## 2023-11-27 NOTE — Plan of Care (Signed)

## 2023-11-28 ENCOUNTER — Observation Stay (HOSPITAL_BASED_OUTPATIENT_CLINIC_OR_DEPARTMENT_OTHER)

## 2023-11-28 DIAGNOSIS — I5022 Chronic systolic (congestive) heart failure: Secondary | ICD-10-CM | POA: Diagnosis present

## 2023-11-28 DIAGNOSIS — I110 Hypertensive heart disease with heart failure: Secondary | ICD-10-CM | POA: Diagnosis present

## 2023-11-28 DIAGNOSIS — I5021 Acute systolic (congestive) heart failure: Secondary | ICD-10-CM

## 2023-11-28 DIAGNOSIS — G8929 Other chronic pain: Secondary | ICD-10-CM | POA: Diagnosis present

## 2023-11-28 DIAGNOSIS — J4489 Other specified chronic obstructive pulmonary disease: Secondary | ICD-10-CM | POA: Diagnosis present

## 2023-11-28 DIAGNOSIS — N3000 Acute cystitis without hematuria: Secondary | ICD-10-CM | POA: Diagnosis not present

## 2023-11-28 DIAGNOSIS — Z887 Allergy status to serum and vaccine status: Secondary | ICD-10-CM | POA: Diagnosis not present

## 2023-11-28 DIAGNOSIS — K219 Gastro-esophageal reflux disease without esophagitis: Secondary | ICD-10-CM | POA: Diagnosis present

## 2023-11-28 DIAGNOSIS — Z7901 Long term (current) use of anticoagulants: Secondary | ICD-10-CM | POA: Diagnosis not present

## 2023-11-28 DIAGNOSIS — Z881 Allergy status to other antibiotic agents status: Secondary | ICD-10-CM | POA: Diagnosis not present

## 2023-11-28 DIAGNOSIS — J9611 Chronic respiratory failure with hypoxia: Secondary | ICD-10-CM | POA: Diagnosis present

## 2023-11-28 DIAGNOSIS — Z79899 Other long term (current) drug therapy: Secondary | ICD-10-CM | POA: Diagnosis not present

## 2023-11-28 DIAGNOSIS — N390 Urinary tract infection, site not specified: Secondary | ICD-10-CM | POA: Diagnosis present

## 2023-11-28 DIAGNOSIS — Z91048 Other nonmedicinal substance allergy status: Secondary | ICD-10-CM | POA: Diagnosis not present

## 2023-11-28 DIAGNOSIS — H8110 Benign paroxysmal vertigo, unspecified ear: Secondary | ICD-10-CM | POA: Diagnosis present

## 2023-11-28 DIAGNOSIS — H8113 Benign paroxysmal vertigo, bilateral: Secondary | ICD-10-CM | POA: Diagnosis not present

## 2023-11-28 DIAGNOSIS — Z888 Allergy status to other drugs, medicaments and biological substances status: Secondary | ICD-10-CM | POA: Diagnosis not present

## 2023-11-28 DIAGNOSIS — Z950 Presence of cardiac pacemaker: Secondary | ICD-10-CM | POA: Diagnosis not present

## 2023-11-28 DIAGNOSIS — I4811 Longstanding persistent atrial fibrillation: Secondary | ICD-10-CM | POA: Diagnosis present

## 2023-11-28 DIAGNOSIS — Z90710 Acquired absence of both cervix and uterus: Secondary | ICD-10-CM | POA: Diagnosis not present

## 2023-11-28 DIAGNOSIS — Z825 Family history of asthma and other chronic lower respiratory diseases: Secondary | ICD-10-CM | POA: Diagnosis not present

## 2023-11-28 DIAGNOSIS — J8489 Other specified interstitial pulmonary diseases: Secondary | ICD-10-CM | POA: Diagnosis present

## 2023-11-28 DIAGNOSIS — I428 Other cardiomyopathies: Secondary | ICD-10-CM | POA: Diagnosis present

## 2023-11-28 DIAGNOSIS — R42 Dizziness and giddiness: Secondary | ICD-10-CM | POA: Diagnosis present

## 2023-11-28 DIAGNOSIS — Z933 Colostomy status: Secondary | ICD-10-CM | POA: Diagnosis not present

## 2023-11-28 DIAGNOSIS — Z87891 Personal history of nicotine dependence: Secondary | ICD-10-CM | POA: Diagnosis not present

## 2023-11-28 LAB — ECHOCARDIOGRAM COMPLETE
AR max vel: 2.08 cm2
AV Area VTI: 1.81 cm2
AV Area mean vel: 1.98 cm2
AV Mean grad: 8 mmHg
AV Peak grad: 17.3 mmHg
Ao pk vel: 2.08 m/s
Area-P 1/2: 3.33 cm2
Calc EF: 48.7 %
Height: 60 in
S' Lateral: 3.3 cm
Single Plane A2C EF: 35.5 %
Single Plane A4C EF: 58.6 %
Weight: 4031.77 [oz_av]

## 2023-11-28 LAB — BASIC METABOLIC PANEL WITH GFR
Anion gap: 5 (ref 5–15)
BUN: 15 mg/dL (ref 8–23)
CO2: 23 mmol/L (ref 22–32)
Calcium: 9.6 mg/dL (ref 8.9–10.3)
Chloride: 107 mmol/L (ref 98–111)
Creatinine, Ser: 0.66 mg/dL (ref 0.44–1.00)
GFR, Estimated: >60 mL/min (ref >60)
Glucose, Bld: 120 mg/dL — ABNORMAL HIGH (ref 70–99)
Potassium: 4.3 mmol/L (ref 3.5–5.1)
Sodium: 135 mmol/L (ref 135–145)

## 2023-11-28 MED ORDER — MORPHINE SULFATE (PF) 2 MG/ML IV SOLN
2.0000 mg | INTRAVENOUS | Status: DC | PRN
  Administered 2023-11-28: 2 mg via INTRAVENOUS
  Filled 2023-11-28: qty 1

## 2023-11-28 MED ORDER — MECLIZINE HCL 25 MG PO TABS
25.0000 mg | ORAL_TABLET | Freq: Three times a day (TID) | ORAL | Status: DC | PRN
  Administered 2023-11-28 – 2023-11-29 (×2): 25 mg via ORAL
  Filled 2023-11-28 (×3): qty 1

## 2023-11-28 MED ORDER — POLYETHYLENE GLYCOL 3350 17 G PO PACK
17.0000 g | PACK | Freq: Every day | ORAL | Status: DC
  Administered 2023-11-28: 17 g via ORAL
  Filled 2023-11-28 (×2): qty 1

## 2023-11-28 MED ORDER — TORSEMIDE 20 MG PO TABS
20.0000 mg | ORAL_TABLET | Freq: Every day | ORAL | Status: DC
  Administered 2023-11-28 – 2023-11-29 (×2): 20 mg via ORAL
  Filled 2023-11-28 (×2): qty 1

## 2023-11-28 NOTE — Plan of Care (Signed)

## 2023-11-28 NOTE — Progress Notes (Signed)
*  PRELIMINARY RESULTS* Echocardiogram 2D Echocardiogram has been performed.  Eva JONETTA Lash 11/28/2023, 9:54 AM

## 2023-11-28 NOTE — Progress Notes (Signed)
 Progress Note   Patient: Kathleen [name] FMW:[id number] DOB: 06/19/1945 DOA: 11/26/2023     0 DOS: the patient was seen and examined on 11/28/2023   Brief hospital course: 78 y.o. 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 Dizziness is likely secondary to BPPV, improved after Epley's maneuver.  Assessment and Plan:   BPPV. Epley maneuvre done, vertigo is better. Note from Cardiology RN 6/16 states that patient was supposed to try furosemide  20 mg twice a day every other day for 1 week.  Orthostatic vital signs were noted to be stable.  CT imaging of the brain have been done which revealed no signs for a stroke.  Patient has received 1 L normal saline IV fluids. - PT to eval and treat -Restarted torsemide  at 20 mg daily on 6/22. -Prn meclizine    Possible acute uncomplicated Urinary tract infection Symptoms  include urinary urgency with hesitancy.  Urinalysis noted large leukocytes with rare bacteria and greater than 50 WBCs.  Patient was started on Rocephin  IV. - Follow-up urine culture - Continue empiric antibiotics of Rocephin    Chronic respiratory failure with hypoxia,POA:  COPD Boop She reports having a nonproductive cough that is slightly worse than baseline..  Patient on 3-4 L of nasal cannula oxygen  at baseline. Patient without significant wheezes or rhonchi present on physical exam. - f/u CXR: Cardiomegaly but no acute issues - Continue Breo Ellipta   - levalbuterol  nebs as needed for shortness of breath or wheezing   History of AV node ablation s/p PPM [employer] - Follow-up interrogation of  pacemaker.  Formally consult cardiology if abnormality noted.   Long standing Persistent atrial fibrillation on chronic anticoagulation Patient in atrial fibrillation but currently rate controlled. 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 - Strict I&Os daily weights - Reassess and determine possible need of adjustment of diuretic dosing in a.m. if symptoms -f/u ECHO Fluid restriction of 1.5 L per day Daily weight Strict I&O   B/l Hip pain: chronic issue, likely severe osteoarthritis - Continue oxycodone  as needed -out patient follow up with ortho -She did get spinal injections in the back. -Not a candidate for hip replacements in the setting of multiple comorbidities   History of diverticulitis S/p colostomy Patient with a history of diverticulitis and colovaginal fistula with perforation requiring exploratory laparotomy with sigmoid colectomy and end ostomy bag on 04/27/2023. - Continue ostomy care   GERD - Continue current medication regimen   DVT prophylaxis: Eliquis   Disposition: [facility]     Subjective: Dizziness has improved. Denies chest pain or shortness of breath. Son present at the bedside.  Physical Exam: Vitals:   11/27/23 1958 11/28/23 0354 11/28/23 0400 11/28/23 0755  BP: (!) 156/79 (!) 124/51  (!) 136/54  Pulse: 72 70  70  Resp: 18 18    Temp: 98.2 F (36.8 C) 97.7 F (36.5 C)  97.7 F (36.5 C)  TempSrc:    Oral  SpO2: 98% 99%  100%  Weight:   114.3 kg   Height:  Constitutional: NAD, calm, comfortable, nasal oxygen  cannula in place Eyes: PERRL, lids and conjunctivae normal ENMT: Mucous membranes are moist. Posterior pharynx clear of any exudate or lesions.Normal dentition.  Neck: normal, supple, no masses, no  thyromegaly Respiratory: clear to auscultation bilaterally, no wheezing, no crackles. Normal respiratory effort. No accessory muscle use.  Cardiovascular: Regular rate and rhythm, no murmurs / rubs / gallops. No extremity edema. 2+ pedal pulses. No carotid bruits.  Abdomen: no tenderness, no masses palpated. No hepatosplenomegaly. Bowel sounds positive. Colostomy in place Musculoskeletal: no clubbing / cyanosis. No joint deformity upper and lower extremities. Good ROM, no contractures. Normal muscle tone.  Skin: no rashes, lesions, ulcers. No induration Neurologic: CN 2-12 grossly intact. Sensation intact, DTR normal. Strength 5/5 x all 4 extremities.  Psychiatric: Normal judgment and insight. Alert and oriented x 3. Normal mood.   Data Reviewed:  There are no new results to review at this time.  Family Communication: Son present at bedside  Disposition: Status is: Inpatient  Planned Discharge Destination: [facility]    Time spent: 40 minutes  Author: Deliliah Room, MD 11/28/2023 9:37 AM  For on call review [email or web address].

## 2023-11-29 ENCOUNTER — Other Ambulatory Visit (HOSPITAL_BASED_OUTPATIENT_CLINIC_OR_DEPARTMENT_OTHER): Payer: Self-pay

## 2023-11-29 DIAGNOSIS — N3000 Acute cystitis without hematuria: Secondary | ICD-10-CM | POA: Diagnosis not present

## 2023-11-29 LAB — BASIC METABOLIC PANEL WITH GFR
Anion gap: 11 (ref 5–15)
BUN: 19 mg/dL (ref 8–23)
CO2: 24 mmol/L (ref 22–32)
Calcium: 9.2 mg/dL (ref 8.9–10.3)
Chloride: 102 mmol/L (ref 98–111)
Creatinine, Ser: 0.89 mg/dL (ref 0.44–1.00)
GFR, Estimated: >60 mL/min (ref >60)
Glucose, Bld: 123 mg/dL — ABNORMAL HIGH (ref 70–99)
Potassium: 3.6 mmol/L (ref 3.5–5.1)
Sodium: 137 mmol/L (ref 135–145)

## 2023-11-29 LAB — MAGNESIUM: Magnesium: 1.7 mg/dL (ref 1.7–2.4)

## 2023-11-29 MED ORDER — MECLIZINE HCL 25 MG PO TABS
25.0000 mg | ORAL_TABLET | Freq: Three times a day (TID) | ORAL | 0 refills | Status: DC | PRN
  Filled 2023-11-29: qty 15, 5d supply, fill #0

## 2023-11-29 MED ORDER — CEFDINIR 300 MG PO CAPS
300.0000 mg | ORAL_CAPSULE | Freq: Two times a day (BID) | ORAL | 0 refills | Status: DC
  Filled 2023-11-29: qty 6, 3d supply, fill #0

## 2023-11-29 MED ORDER — POTASSIUM CHLORIDE CRYS ER 20 MEQ PO TBCR: 40.0000 meq | EXTENDED_RELEASE_TABLET | Freq: Once | ORAL | Status: DC

## 2023-11-29 MED ORDER — MAGNESIUM SULFATE 2 GM/50ML IV SOLN: 2.0000 g | Freq: Once | INTRAVENOUS | Status: DC

## 2023-11-29 MED ORDER — TORSEMIDE 20 MG PO TABS
20.0000 mg | ORAL_TABLET | Freq: Every day | ORAL | 0 refills | Status: DC
  Filled 2023-11-29: qty 60, 60d supply, fill #0

## 2023-11-29 MED ORDER — ACETAMINOPHEN 325 MG PO TABS: 650.0000 mg | ORAL_TABLET | Freq: Four times a day (QID) | ORAL | Status: AC | PRN

## 2023-11-29 NOTE — Progress Notes (Signed)
 Transition of Care [facility]) - Inpatient Brief Assessment   Patient Details  Name: Kathleen [name] MRN: [id number] Date of Birth: 1946-05-29  [facility] CM/SW Contact:    Rosaline JONELLE Joe, RN Phone Number: 11/29/2023, 12:11 PM   Clinical Narrative: CM met with the patient at the bedside to discuss TOC needs for [facility] PT.   The patient lives at [facility] [facility] and plans to return to her apartment when she is discharged home.  The patient plans to call her son to provide transportation to home by car.  DME at the home includes home oxygen  at 4L/min [place], Rolator and [place].  Patient was offered Medicare choice regarding home health and patient states that she prefers [facility].  I called Amy Hyatt, RNCM with [facility] and she accepted for [facility] Pt for vestibular therapy.  HH order for PT placed.  Patient plans to call her son to provide transportation to home when ready today.   Transition of Care Asessment: Insurance and Status: (P) Insurance coverage has been reviewed Patient has primary care physician: (P) Yes Home environment has been reviewed: (P) from [facility] Prior level of function:: (P) Independent but has rolator at home Prior/Current Home Services: (P) No current home services Social Drivers of Health Review: (P) SDOH reviewed interventions complete Readmission risk has been reviewed: (P) Yes Transition of care needs: (P) transition of care needs identified, TOC will continue to follow

## 2023-11-29 NOTE — Discharge Summary (Signed)
 Physician Discharge Summary   Patient: Kathleen [name] MRN: [id number] DOB: [birth year]  Admit date:     11/26/2023  Discharge date: 11/29/23  Discharge Physician: Deliliah Room   PCP: Joshua Debby CROME, MD   Recommendations at discharge:    Follow up with your PCP in one week.  Follow up with orthopedics on the scheduled appointment.  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 *  Hospital Course:    78 y.o. 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 Dizziness was likely secondary to BPPV, improved after Epley's maneuver, done by PT. Dced on prn meclizine . Advised to take Torsemide  20 mg daily and her heart failure nurse can adjust the dose if necessary. She does have h/o HFrEF but repeat ECHO done during this visit improved EF 45-50%. She was discharged back to [facility].    Consultants: None Procedures performed: None  Disposition: Home Diet recommendation:  Cardiac diet DISCHARGE MEDICATION: Allergies as of 11/29/2023       Reactions   Allopurinol  Other (See Comments)   Reaction:  Dizziness    Clindamycin Anaphylaxis, Hives   Flublok [influenza Vaccine Recombinant] Other (See Comments)   Fever 103 with no alternative explanation day after vaccine.  Darice Highfill FNP-C   Pneumococcal 13-val Conj Vacc Itching, Swelling, Rash   Dronedarone Rash   Montelukast  Other (See Comments)   Makes her loopy.   Brovana  [arformoterol ]    Caused muscle pain   Budesonide     Caused extreme joint pain   Entresto  [sacubitril -valsartan ] Other (See Comments)   hypotension   Fosamax [alendronate Sodium] Nausea Only   Jardiance  [empagliflozin ]    Caused a vaginal infection   Meperidine Nausea And Vomiting   Microplegia Msa-msg [plegisol]    Loopy,diarrhea   Rosuvastatin Other (See Comments)   Reaction:  Muscle spasms    Tetracycline Hives   Adhesive [tape] Rash   Lovastatin Rash, Other (See Comments)   Muscle Pain        Medication List     STOP taking these medications    cephALEXin  500 MG capsule Commonly known as: KEFLEX        TAKE these medications    acetaminophen  325 MG tablet Commonly known as: TYLENOL  Take 2 tablets (650 mg total) by mouth every 6 (six) hours as needed for up to 10 days for mild pain (pain score 1-3) or fever (or Fever >/= 101).   Breo Ellipta  100-25 MCG/ACT Aepb Generic drug: fluticasone  furoate-vilanterol INHALE 1 PUFF INTO THE LUNGS DAILY   cyanocobalamin  1000 MCG tablet Commonly known as: VITAMIN B12 Take 1,000 mcg by mouth daily.   Desitin Maximum Strength 40 % Pste Generic drug: Zinc   Oxide Apply 1 Application topically 4 (four) times daily. Mix with aquaphor and apply to inflamed area   docusate sodium  100 MG capsule Commonly known as: COLACE Take 100 mg by mouth daily.   dofetilide  250 MCG capsule Commonly known as: TIKOSYN  Take 1 capsule (250 mcg total) by mouth in the morning and at bedtime.   Eliquis  5 MG Tabs tablet Generic drug: apixaban  Take 1 tablet (5 mg total) by mouth 2  (two) times daily.   febuxostat  40 MG tablet Commonly known as: ULORIC  Take 1 tablet (40 mg total) by mouth daily.   gabapentin  300 MG capsule Commonly known as: Neurontin  Take 1 capsule (300 mg total) by mouth at bedtime.   ketoconazole  2 % cream Commonly known as: NIZORAL  Apply 1 Application topically 2 (two) times daily.   levalbuterol  0.63 MG/3ML nebulizer solution Commonly known as: XOPENEX  Take 3 mLs (0.63 mg total) by nebulization every 6 (six) hours as needed for wheezing or shortness of breath. DX J44.89 J96.21   Magnesium  500 MG Tabs Take 500 mg by mouth every evening.   meclizine  25 MG tablet Commonly known as: ANTIVERT  Take 1 tablet (25 mg total) by mouth 3 (three) times daily as needed for dizziness.   mupirocin  ointment 2 % Commonly known as: BACTROBAN  Apply 1 Application topically 2 (two) times daily.   naloxone  4 MG/0.1ML Liqd nasal spray kit Commonly known as: NARCAN  Place 1 spray into the nose once as needed (opioid overdose).   oxyCODONE  5 MG immediate release tablet Commonly known as: Oxy IR/ROXICODONE  Take 1-2 tablets (5-10 mg total) by mouth every 6 (six) hours as needed for moderate pain (pain score 4-6) or severe pain (pain score 7-10) (5mg  moderate, 10mg  severe).   oxyCODONE  5 MG immediate release tablet Commonly known as: Oxy IR/ROXICODONE  1 po q 8-12hrs prn pain   oxyCODONE -acetaminophen  5-325 MG tablet Commonly known as: PERCOCET/ROXICET Take 1 tablet by mouth every 8 (eight) hours as needed for moderate pain (pain score 4-6).   OXYGEN  Inhale 4 L into the lungs continuous. Use with resmed ventilator   potassium chloride  10 MEQ tablet Commonly known as: KLOR-CON  TAKE 1 TABLET(10 MEQ) BY MOUTH THREE TIMES DAILY What changed: See the new instructions.   revefenacin  175 MCG/3ML nebulizer solution Commonly known as: YUPELRI  Take 175 mcg by nebulization daily.   spironolactone  25 MG tablet Commonly known as: ALDACTONE  Take 12.5 mg by  mouth every evening.   torsemide  20 MG tablet Commonly known as: DEMADEX  Take 1 tablet (20 mg total) by mouth daily. What changed: how much to take   triamcinolone  cream 0.1 % Commonly known as: KENALOG  Apply 1 Application topically 2 (two) times daily.   Vitamin D3 50 MCG (2000 UT) Tabs Take 2,000 Units by mouth at bedtime.        Follow-up Information     Joshua Debby CROME, MD Follow up in 1 week(s).   Specialty: Internal Medicine Contact information: [address] [place] [address] [phone]                Discharge Exam: Fredricka Weights   11/27/23 0454 11/28/23 0400 11/29/23 0500  Weight: (P) 112.7 kg 114.3 kg 113.5 kg   Constitutional: NAD, calm, comfortable Eyes: PERRL, lids and conjunctivae normal ENMT: Mucous membranes are moist. Posterior pharynx clear of any exudate or lesions.Normal dentition.  Neck: normal, supple, no masses, no thyromegaly Respiratory: clear to auscultation bilaterally, no wheezing, no crackles. Normal respiratory effort. No accessory muscle use.  Cardiovascular: Irregularly irregular rhythm/ rubs / gallops. No extremity edema. 2+ pedal pulses. No carotid bruits.  Abdomen: no tenderness, no masses palpated. No hepatosplenomegaly. Bowel sounds positive.  Musculoskeletal: no clubbing / cyanosis. No joint deformity upper and lower extremities. Good ROM, no contractures. Normal muscle tone.  Skin: no rashes, lesions, ulcers. No induration Neurologic: CN 2-12 grossly intact. Sensation intact, DTR normal. Strength 5/5 x all 4 extremities.  Psychiatric: Normal judgment and insight. Alert and oriented x 3. Normal mood.    Condition at discharge: good  The results of significant diagnostics from this hospitalization (including imaging, microbiology, ancillary and laboratory) are listed below for reference.   Imaging Studies: ECHOCARDIOGRAM COMPLETE Result Date: 11/28/2023    ECHOCARDIOGRAM REPORT   Patient Name:   BRYANAH SIDELL  Date of Exam: 11/28/2023 Medical Rec #:  [id number]       Height:       60.0 in Accession #:    [id number]      Weight:       252.0 lb Date of Birth:  March 03, 1946       BSA:          2.059 m Patient Age:    77 years        BP:           124/51 mmHg Patient Gender: F               HR:           72 bpm. Exam Location:  Inpatient Procedure: 2D Echo, Cardiac Doppler and Color Doppler (Both Spectral and Color            Flow Doppler were utilized during procedure). Indications:    I50.21 Acute systolic (congestive) heart failure  History:        Patient has prior history of Echocardiogram examinations, most                 recent 09/15/2022.  Sonographer:    Eva Lash Referring Phys: [id number] DELILIAH Samuel Rittenhouse IMPRESSIONS  1. Left ventricular ejection fraction, by estimation, is 45 to 50%. The left ventricle has mildly decreased function. The left ventricle demonstrates global hypokinesis. There is mild left ventricular hypertrophy. Left ventricular diastolic parameters were normal.  2. Right ventricular systolic function is normal. The right ventricular size is mildly enlarged. There is normal pulmonary artery systolic pressure. The estimated right ventricular systolic pressure is 32.6 mmHg.  3. The mitral valve is normal in structure. Mild mitral valve regurgitation. No evidence of mitral stenosis.  4. The aortic valve is calcified. There is moderate calcification of the aortic valve. Aortic valve regurgitation is not visualized. Aortic valve sclerosis is present, with no evidence of aortic valve stenosis. Aortic valve mean gradient measures 8.0 mmHg. Aortic valve Vmax measures 2.08 m/s.  5. The inferior vena cava is normal in size with greater than 50% respiratory variability, suggesting right atrial pressure of 3 mmHg. FINDINGS  Left Ventricle: Left ventricular ejection fraction, by estimation, is 45 to 50%. The left ventricle has mildly decreased function. The left ventricle demonstrates global hypokinesis. The left  ventricular internal cavity size was normal in size. There is  mild left ventricular hypertrophy. Abnormal (paradoxical) septal motion, consistent with left bundle branch block. Left ventricular diastolic parameters were normal. Right Ventricle: The right ventricular size is mildly enlarged. No increase in right ventricular wall thickness. Right ventricular systolic function is normal. There is normal pulmonary artery systolic pressure. The tricuspid regurgitant velocity is  2.72  m/s, and with an assumed right atrial pressure of 3 mmHg, the estimated right ventricular systolic pressure is 32.6 mmHg. Left Atrium: Left atrial size was normal in size. Right Atrium: Right atrial size was normal in size. Pericardium: There is no evidence of pericardial effusion. Mitral Valve: The mitral valve is normal in structure. Mild mitral valve regurgitation. No evidence of mitral valve stenosis. Tricuspid Valve: The tricuspid valve is normal in structure. Tricuspid valve regurgitation is trivial. No evidence of tricuspid stenosis. Aortic Valve: The aortic valve is calcified. There is moderate calcification of the aortic valve. Aortic valve regurgitation is not visualized. Aortic valve sclerosis is present, with no evidence of aortic valve stenosis. Aortic valve mean gradient measures 8.0 mmHg. Aortic valve peak gradient measures 17.3 mmHg. Aortic valve area, by VTI measures 1.81 cm. Pulmonic Valve: The pulmonic valve was normal in structure. Pulmonic valve regurgitation is not visualized. No evidence of pulmonic stenosis. Aorta: The aortic root is normal in size and structure. Venous: The inferior vena cava is normal in size with greater than 50% respiratory variability, suggesting right atrial pressure of 3 mmHg. IAS/Shunts: No atrial level shunt detected by color flow Doppler.  LEFT VENTRICLE PLAX 2D LVIDd:         4.20 cm     Diastology LVIDs:         3.30 cm     LV e' medial:    6.85 cm/s LV PW:         1.90 cm     LV E/e'  medial:  11.9 LV IVS:        1.20 cm     LV e' lateral:   9.57 cm/s LVOT diam:     2.10 cm     LV E/e' lateral: 8.5 LV SV:         63 LV SV Index:   31 LVOT Area:     3.46 cm  LV Volumes (MOD) LV vol d, MOD A2C: 65.3 ml LV vol d, MOD A4C: 83.6 ml LV vol s, MOD A2C: 42.1 ml LV vol s, MOD A4C: 34.6 ml LV SV MOD A2C:     23.2 ml LV SV MOD A4C:     83.6 ml LV SV MOD BP:      36.3 ml RIGHT VENTRICLE RV S prime:     14.90 cm/s TAPSE (M-mode): 1.9 cm LEFT ATRIUM             Index LA diam:        3.30 cm 1.60 cm/m LA Vol (A2C):   66.0 ml 32.05 ml/m LA Vol (A4C):   36.3 ml 17.63 ml/m LA Biplane Vol: 48.9 ml 23.75 ml/m  AORTIC VALVE AV Area (Vmax):    2.08 cm AV Area (Vmean):   1.98 cm AV Area (VTI):     1.81 cm AV Vmax:           208.00 cm/s AV Vmean:          131.000 cm/s AV VTI:            0.350 m AV Peak Grad:      17.3 mmHg AV Mean Grad:      8.0 mmHg LVOT Vmax:         125.00 cm/s LVOT Vmean:        74.800 cm/s LVOT VTI:          0.183 m LVOT/AV VTI ratio: 0.52  AORTA Ao Asc diam: 3.60 cm MITRAL  VALVE               TRICUSPID VALVE MV Area (PHT): 3.33 cm    TR Peak grad:   29.6 mmHg MV Decel Time: 228 msec    TR Vmax:        272.00 cm/s MV E velocity: 81.70 cm/s MV A velocity: 70.60 cm/s  SHUNTS MV E/A ratio:  1.16        Systemic VTI:  0.18 m                            Systemic Diam: 2.10 cm Oneil Parchment MD Electronically signed by Oneil Parchment MD Signature Date/Time: 11/28/2023/11:22:43 AM    Final    DG CHEST PORT 1 VIEW Result Date: 11/26/2023 CLINICAL DATA:  Cough EXAM: PORTABLE CHEST 1 VIEW COMPARISON:  09/17/2023 FINDINGS: Lungs are clear. Progressive cardiomegaly. Left subclavian 3 lead pacemaker and pulmonary arterial pressure monitor are unchanged. Pulmonary vascularity is normal. No pneumothorax or pleural effusion. No acute bone abnormality. IMPRESSION: 1. Progressive cardiomegaly. Electronically Signed   By: Dorethia Molt M.D.   On: 11/26/2023 19:26   CT Head Wo Contrast Result Date:  11/26/2023 EXAM: CT HEAD WITHOUT CONTRAST 11/26/2023 03:51:28 PM TECHNIQUE: CT of the head was performed without the administration of intravenous contrast. Automated exposure control, iterative reconstruction, and/or weight based adjustment of the mA/kV was utilized to reduce the radiation dose to as low as reasonably achievable. COMPARISON: 03/17/2022 CLINICAL HISTORY: Headache, increasing frequency or severity; new headaches, vertigo. Pt arrives via EMS from [facility] [place]. Pt reports sudden onset of dizziness that started around 1200 today. Pt also c/o of headache. States the dizziness worsens when she moves her head or changes positions. Pt arrives AxOx4. FINDINGS: BRAIN AND VENTRICLES: No acute hemorrhage. Gray-white differentiation is preserved. No hydrocephalus. No extra-axial collection. No mass effect or midline shift. Unchanged mild generalized volume loss within the expected range for patient age. ORBITS: No acute abnormality. SINUSES: No acute abnormality. SOFT TISSUES AND SKULL: No acute soft tissue abnormality. No skull fracture. IMPRESSION: 1. No acute intracranial abnormality. 2. Unchanged mild generalized volume loss within the expected range for patient age. Electronically signed by: Ryan Chess MD 11/26/2023 04:08 PM EDT RP Workstation: [id number]    Microbiology: Results for orders placed or performed during the hospital encounter of 11/26/23  Urine Culture     Status: None (Preliminary result)   Collection Time: 11/26/23  3:57 PM   Specimen: Urine, Clean Catch  Result Value Ref Range Status   Specimen Description URINE, CLEAN CATCH  Final   Special Requests NONE  Final   Culture   Final    CULTURE REINCUBATED FOR BETTER GROWTH Performed at [facility], [address], [place], [place] [address]    Report Status PENDING  Incomplete    Labs: CBC: Recent Labs  Lab 11/26/23 1434 11/27/23 0532  WBC 9.8 6.4  HGB 13.4 11.9*  HCT 43.2 38.3  MCV 84.0 82.9   PLT 193 174   Basic Metabolic Panel: Recent Labs  Lab 11/26/23 1434 11/27/23 0532 11/28/23 0334 11/29/23 0337  NA 139 142 135 137  K 3.8 3.6 4.3 3.6  CL 99 106 107 102  CO2 26 28 23 24   GLUCOSE 104* 99 120* 123*  BUN 19 18 15 19   CREATININE 0.63 0.59 0.66 0.89  CALCIUM  9.7 9.2 9.6 9.2  MG  --  2.0  --  1.7  Liver Function Tests: Recent Labs  Lab 11/26/23 1434  AST 14*  ALT 11  ALKPHOS 49  BILITOT 0.8  PROT 6.2*  ALBUMIN  3.8   CBG: Recent Labs  Lab 11/26/23 1433  GLUCAP 101*    Discharge time spent: greater than 30 minutes.  Signed: Deliliah Room, MD [facility] 11/29/2023

## 2023-11-29 NOTE — Plan of Care (Signed)
 Assumed care at 1900. Pt has been resting comfortably in bed overnight. Pt has expressed hip pain more in right vs left. See MAR. Pt had soft BP overnight. Pt has concern about the output from her colostomy bag.    Problem: Education: Goal: Knowledge of General Education information will improve Description: Including pain rating scale, medication(s)/side effects and non-pharmacologic comfort measures Outcome: Progressing   Problem: Clinical Measurements: Goal: Ability to maintain clinical measurements within normal limits will improve Outcome: Progressing Goal: Will remain free from infection Outcome: Progressing Goal: Diagnostic test results will improve Outcome: Progressing Goal: Respiratory complications will improve Outcome: Progressing Goal: Cardiovascular complication will be avoided Outcome: Progressing   Problem: Activity: Goal: Risk for activity intolerance will decrease Outcome: Progressing   Problem: Nutrition: Goal: Adequate nutrition will be maintained Outcome: Progressing   Problem: Pain Managment: Goal: General experience of comfort will improve and/or be controlled Outcome: Progressing   Problem: Skin Integrity: Goal: Risk for impaired skin integrity will decrease Outcome: Progressing   Problem: Safety: Goal: Ability to remain free from injury will improve Outcome: Progressing

## 2023-11-29 NOTE — Progress Notes (Signed)
 Mobility Specialist: Progress Note   11/29/23 1335  Mobility  Activity Ambulated with assistance in hallway  Level of Assistance Standby assist, set-up cues, supervision of patient - no hands on  Assistive Device Front wheel walker  Distance Ambulated (ft) 250 ft  Activity Response Tolerated well  Mobility Referral Yes  Mobility visit 1 Mobility  Mobility Specialist Start Time (ACUTE ONLY) [id number]  Mobility Specialist Stop Time (ACUTE ONLY) 0849  Mobility Specialist Time Calculation (min) (ACUTE ONLY) 13 min    Pt received on EOB, agreeable to mobility session. No complaints. SpO2 WFL on 4LO2. Returned to room without fault. Left on EOB with all needs met, call bell in reach.   Ileana Lute Mobility Specialist Please contact via SecureChat or Rehab office at [phone]

## 2023-11-30 ENCOUNTER — Telehealth: Payer: Self-pay

## 2023-11-30 ENCOUNTER — Telehealth: Payer: Self-pay | Admitting: Internal Medicine

## 2023-11-30 LAB — URINE CULTURE: Culture: 10000 — AB

## 2023-11-30 NOTE — Telephone Encounter (Signed)
 Copied from CRM [id number]. Topic: General - Other >> Nov 30, 2023  1:32 PM Aleatha C wrote: Reason for CRM: [facility] from [facility]  called regarding patient concerns  that the Potassium Chloride  is  interacting with  the Spironolctone 25MG ,  and Potassium Chloride  interacting with the 25mg  Meclizine    Call back  # [phone]

## 2023-11-30 NOTE — Transitions of Care (Post Inpatient/ED Visit) (Signed)
   11/30/2023  Name: JASELLE PRYER MRN: [id number] DOB: 31-Dec-1945  Today's TOC FU Call Status: Today's TOC FU Call Status:: Unsuccessful Call (1st Attempt) Unsuccessful Call (1st Attempt) Date: 11/30/23  Attempted to reach the patient regarding the most recent Inpatient/ED visit.  Follow Up Plan: Additional outreach attempts will be made to reach the patient to complete the Transitions of Care (Post Inpatient/ED visit) call.   Shona Prow RN, CCM   [facility] RN Care Manager [phone]

## 2023-11-30 NOTE — Telephone Encounter (Unsigned)
 Copied from CRM [id number]. Topic: Clinical - Home Health Verbal Orders >> Nov 30, 2023 12:39 PM Franky GRADE wrote: Caller/Agency: [facility] Callback Number: [phone] Service Requested: Physical Therapy / Occupational Therapy  Frequency: Physical Therapy 2W2 & 1W4 / OT Eval Any new concerns about the patient? No Voicemail box not secure

## 2023-12-01 ENCOUNTER — Telehealth: Payer: Self-pay | Admitting: *Deleted

## 2023-12-01 NOTE — Telephone Encounter (Signed)
 Copied from CRM [id number]. Topic: Clinical - Home Health Verbal Orders >> Nov 30, 2023 12:39 PM Franky GRADE wrote: Caller/Agency: [facility] Callback Number: [phone] Service Requested: Physical Therapy / Occupational Therapy  Frequency: Physical Therapy 2W2 & 1W4 / OT Eval Any new concerns about the patient? No Voicemail box not secure >> Dec 01, 2023  2:59 PM Franky GRADE wrote: [facility] is calling to follow up on the verbal order request.

## 2023-12-01 NOTE — Transitions of Care (Post Inpatient/ED Visit) (Signed)
 12/01/2023  Name: Kathleen [name] MRN: [id number] DOB: 06-15-1945  Today's TOC FU Call Status: Today's TOC FU Call Status:: Successful TOC FU Call Completed TOC FU Call Complete Date: 12/01/23 Patient's Name and Date of Birth confirmed.  Transition Care Management Follow-up Telephone Call Date of Discharge: 11/29/23 Discharge Facility: [facility]) Type of Discharge: Inpatient Admission Primary Inpatient Discharge Diagnosis:: UTI How have you been since you were released from the hospital?: Better (I am better, taking the antibiotics like they told me to; I have developed some kind of dizziness issue so the PT are coming out and working with me; not having to use the walker even though I am dizzy.  Thanks for getting the doctor appointment set up) Any questions or concerns?: Yes Patient Questions/Concerns:: I am still having epsidoes of dizziness, not sure what it is coming from, it's been going on for little awhile Patient Questions/Concerns Addressed: Other: (facilitated scheduling of prompt PCP hospital follow up appointment- 12/06/23; encouraged patient to use assistive devices she has at home if she feels dizzy, to prevent falls;provided education/ reinforcement around fall prevention)  Items Reviewed: Did you receive and understand the discharge instructions provided?: Yes (thoroughly reviewed with patient who verbalizes good understanding of same) Medications obtained,verified, and reconciled?: Yes (Medications Reviewed) (Full medication reconciliation/ review completed; no concerns or discrepancies identified; confirmed patient obtained/ is taking all newly Rx'd medications as instructed; self-manages medications and denies questions/ concerns around medications today) Any new allergies since your discharge?: No Dietary orders reviewed?: Yes Type of Diet Ordered:: Healthy as possible Do you have support at home?: Yes People in Home [RPT]: alone Name of Support/Comfort  Primary Source: Reports independent in self-care activities; resides at ILF and her son/ grandson and other family members assists as/ if needed/ indicated; reports ILF provides one meal per day and transportation services if made 2 weeks in advance  Medications Reviewed Today: Medications Reviewed Today     Reviewed by Markesha Hannig M, RN (Registered Nurse) on 12/01/23 at 1640  Med List Status: <None>   Medication Order Taking? Sig Documenting Provider Last Dose Status Informant  acetaminophen  (TYLENOL ) 325 MG tablet [id number] Yes Take 2 tablets (650 mg total) by mouth every 6 (six) hours as needed for up to 10 days for mild pain (pain score 1-3) or fever (or Fever >/= 101). Rashid, Farhan, MD  Active   cefdinir  (OMNICEF ) 300 MG capsule [id number] Yes Take 1 capsule (300 mg total) by mouth 2 (two) times daily. Dino Antu, MD  Active   Cholecalciferol  (VITAMIN D3) 50 MCG (2000 UT) TABS [id number] Yes Take 2,000 Units by mouth at bedtime. [provider]  Active Self  cyanocobalamin  (VITAMIN B12) 1000 MCG tablet [id number] Yes Take 1,000 mcg by mouth daily. [provider]  Active Self  docusate sodium  (COLACE) 100 MG capsule [id number] Yes Take 100 mg by mouth daily. [provider]  Active Self  dofetilide  (TIKOSYN ) 250 MCG capsule [id number] Yes Take 1 capsule (250 mcg total) by mouth in the morning and at bedtime. Rolan Ezra RAMAN, MD  Active Self  ELIQUIS  5 MG TABS tablet [id number] Yes Take 1 tablet (5 mg total) by mouth 2 (two) times daily. Bensimhon, Daniel R, MD  Active Self  febuxostat  (ULORIC ) 40 MG tablet [id number] Yes Take 1 tablet (40 mg total) by mouth daily. Jeannetta Lonni ORN, MD  Active Self  fluticasone  furoate-vilanterol (BREO ELLIPTA ) 100-25 MCG/ACT AEPB [id number] Yes INHALE 1 PUFF INTO THE LUNGS DAILY Jude Donning  V, MD  Active Self  gabapentin  (NEURONTIN ) 300 MG capsule [id number] Yes Take 1 capsule (300 mg total) by mouth at bedtime. Gregg Lek, MD   Active Self  ketoconazole  (NIZORAL ) 2 % cream [id number] Yes Apply 1 Application topically 2 (two) times daily. Joshua Debby CROME, MD  Active Self  levalbuterol  (XOPENEX ) 0.63 MG/3ML nebulizer solution [id number]  Take 3 mLs (0.63 mg total) by nebulization every 6 (six) hours as needed for wheezing or shortness of breath. DX [id number]  Patient not taking: Reported on 12/01/2023   Jude Harden GAILS, MD  Active Self  Magnesium  500 MG TABS [id number] Yes Take 500 mg by mouth every evening. [provider]  Active Self  meclizine  (ANTIVERT ) 25 MG tablet [id number] Yes Take 1 tablet (25 mg total) by mouth 3 (three) times daily as needed for dizziness. Rashid, Farhan, MD  Active   mupirocin  ointment (BACTROBAN ) 2 % [id number] Yes Apply 1 Application topically 2 (two) times daily. Joshua Debby CROME, MD  Active Self  naloxone  (NARCAN ) nasal spray 4 mg/0.1 mL [id number] Yes Place 1 spray into the nose once as needed (opioid overdose). [provider]  Active Self  oxyCODONE  (OXY IR/ROXICODONE ) 5 MG immediate release tablet [id number] Yes Take 1-2 tablets (5-10 mg total) by mouth every 6 (six) hours as needed for moderate pain (pain score 4-6) or severe pain (pain score 7-10) (5mg  moderate, 10mg  severe). Singh, Prashant K, MD  Active Self  oxyCODONE  (OXY IR/ROXICODONE ) 5 MG immediate release tablet [id number]  1 po q 8-12hrs prn pain  Patient not taking: Reported on 12/01/2023   Addie Cordella Hamilton, MD  Active Self  oxyCODONE -acetaminophen  (PERCOCET/ROXICET) 5-325 MG tablet [id number] Yes Take 1 tablet by mouth every 8 (eight) hours as needed for moderate pain (pain score 4-6). [provider]  Active Self  OXYGEN  [id number] Yes Inhale 4 L into the lungs continuous. Use with resmed ventilator [provider]  Active Self  potassium chloride  (KLOR-CON ) 10 MEQ tablet [id number] Yes TAKE 1 TABLET(10 MEQ) BY MOUTH THREE TIMES DAILY Joshua Debby CROME, MD  Active Self  revefenacin  (YUPELRI ) 175  MCG/3ML nebulizer solution [id number] Yes Take 175 mcg by nebulization daily. [provider]  Active Self           Med Note ROLENE STALLION, SUSAN A   Fri Nov 26, 2023  6:45 PM) On hold till 11/29/2023  spironolactone  (ALDACTONE ) 25 MG tablet [id number] Yes Take 12.5 mg by mouth every evening. [provider]  Active Self  torsemide  (DEMADEX ) 20 MG tablet [id number] Yes Take 1 tablet (20 mg total) by mouth daily. Rashid, Farhan, MD  Active   triamcinolone  cream (KENALOG ) 0.1 % [id number]  Apply 1 Application topically 2 (two) times daily.  Patient not taking: Reported on 12/01/2023   Cyndi Shaver, PA-C  Active Self  Zinc  Oxide (DESITIN MAXIMUM STRENGTH) 40 % PSTE [id number]  Apply 1 Application topically 4 (four) times daily. Mix with aquaphor and apply to inflamed area  Patient not taking: Reported on 12/01/2023   [provider]  Active Self  Med List Note Leobardo Garre, CPhT 04/22/23 2309): [facility]- use mobile number to contact           Home Care and Equipment/Supplies: Were Home Health Services Ordered?: Yes Name of Home Health Agency:: [facility] for vestibular PT- for ongoing dizziness/ off balance feeling Has Agency set up a time to come to your home?: Yes First Home Health Visit Date: 11/30/23 Any new equipment  or medical supplies ordered?: No  Functional Questionnaire: Do you need assistance with bathing/showering or dressing?: No Do you need assistance with meal preparation?: No Do you need assistance with eating?: No Do you have difficulty maintaining continence: No Do you need assistance with getting out of bed/getting out of a chair/moving?: No Do you have difficulty managing or taking your medications?: No  Follow up appointments reviewed: PCP Follow-up appointment confirmed?: Yes Date of PCP follow-up appointment?: 12/06/23 Follow-up Provider: PCP- covering provider Corean Ku NP [facility] Follow-up  appointment confirmed?: NA (verified none recommended per hospital discharging provider discharge notes) Do you need transportation to your follow-up appointment?: No Do you understand care options if your condition(s) worsen?: Yes-patient verbalized understanding  SDOH Interventions Today    Flowsheet Row Most Recent Value  SDOH Interventions   Food Insecurity Interventions Intervention Not Indicated  [reports family brings groceries over and she orders groceries online as well]  Housing Interventions Intervention Not Indicated  Transportation Interventions Intervention Not Indicated  [Family or ILF provide transportation]  Utilities Interventions Intervention Not Indicated   Patient declines need for ongoing/ further care management/ coordination outreach; declines enrollment in 30-day TOC program- declines taking my direct phone number should needs/ concerns arise post-TOC call   See TOC assessment tabs for additional assessment/ TOC intervention information  Pls call/ message for questions,  Elliotte Marsalis Mckinney Nuala Chiles, RN, BSN, Media planner  Transitions of Care  [facility]  [place] [phone]: direct office

## 2023-12-02 NOTE — Telephone Encounter (Signed)
 Called and spoke with [facility]. Provided him with verbal OK on physical therapy and occupational therapy orders

## 2023-12-02 NOTE — Telephone Encounter (Signed)
 Yes, ok

## 2023-12-02 NOTE — Telephone Encounter (Signed)
 Copied from CRM [id number]. Topic: Clinical - Home Health Verbal Orders  >> Dec 02, 2023  1:31 PM Mercedes MATSU wrote: Medford is following up about the verbal orders. Is requesting a call back, this is his third time calling.

## 2023-12-02 NOTE — Telephone Encounter (Signed)
 Left message with [employer] to refax blank CMN to have Dr. Jude sign for nebulizer machine

## 2023-12-03 ENCOUNTER — Telehealth: Payer: Self-pay

## 2023-12-03 ENCOUNTER — Telehealth: Payer: Self-pay | Admitting: Internal Medicine

## 2023-12-03 NOTE — Telephone Encounter (Signed)
Orders has been given.

## 2023-12-03 NOTE — Telephone Encounter (Signed)
 Spoke with [employer] who states they will fax CMN to Dr. Cyndi office at [phone].

## 2023-12-03 NOTE — Telephone Encounter (Signed)
 Please advise if there is any concerns about the 2 interactions that populated.

## 2023-12-03 NOTE — Telephone Encounter (Signed)
 Unable to reach chris. LMTRC

## 2023-12-03 NOTE — Telephone Encounter (Signed)
 Can I give the verbals ?

## 2023-12-03 NOTE — Telephone Encounter (Signed)
 This is being handled in another call.

## 2023-12-03 NOTE — Telephone Encounter (Unsigned)
 Copied from CRM [id number]. Topic: Clinical - Home Health Verbal Orders >> Dec 03, 2023 12:26 PM Viola FALCON wrote: Caller/Agency: Will from [facility]  Callback Number: [phone] Service Requested: Occupational Therapy Frequency: 1X A WEEK 5 WEEKS  Any new concerns about the patient? No

## 2023-12-03 NOTE — Telephone Encounter (Signed)
 Copied from CRM [id number]. Topic: General - Call Back - No Documentation >> Dec 03, 2023  1:51 PM Mercedes MATSU wrote: Reason for CRM: Med[facility] from [facility] calling got speak with jazz, is requesting a call back and can be reached at [phone].

## 2023-12-06 ENCOUNTER — Ambulatory Visit: Admitting: Family Medicine

## 2023-12-06 ENCOUNTER — Other Ambulatory Visit (HOSPITAL_BASED_OUTPATIENT_CLINIC_OR_DEPARTMENT_OTHER): Payer: Self-pay

## 2023-12-06 ENCOUNTER — Encounter: Payer: Self-pay | Admitting: Family Medicine

## 2023-12-06 VITALS — BP 134/80 | HR 77 | Temp 98.7°F | Ht 60.0 in | Wt 187.0 lb

## 2023-12-06 DIAGNOSIS — Z7901 Long term (current) use of anticoagulants: Secondary | ICD-10-CM

## 2023-12-06 DIAGNOSIS — I5042 Chronic combined systolic (congestive) and diastolic (congestive) heart failure: Secondary | ICD-10-CM

## 2023-12-06 DIAGNOSIS — I4811 Longstanding persistent atrial fibrillation: Secondary | ICD-10-CM

## 2023-12-06 DIAGNOSIS — E1140 Type 2 diabetes mellitus with diabetic neuropathy, unspecified: Secondary | ICD-10-CM

## 2023-12-06 DIAGNOSIS — N3000 Acute cystitis without hematuria: Secondary | ICD-10-CM

## 2023-12-06 DIAGNOSIS — J9611 Chronic respiratory failure with hypoxia: Secondary | ICD-10-CM

## 2023-12-06 DIAGNOSIS — D6859 Other primary thrombophilia: Secondary | ICD-10-CM

## 2023-12-06 DIAGNOSIS — H8113 Benign paroxysmal vertigo, bilateral: Secondary | ICD-10-CM | POA: Diagnosis not present

## 2023-12-06 DIAGNOSIS — Z933 Colostomy status: Secondary | ICD-10-CM

## 2023-12-06 LAB — POCT URINALYSIS DIP (CLINITEK)
Bilirubin, UA: NEGATIVE
Glucose, UA: NEGATIVE mg/dL
Ketones, POC UA: NEGATIVE mg/dL
Nitrite, UA: NEGATIVE
POC PROTEIN,UA: NEGATIVE
Spec Grav, UA: 1.015 (ref 1.010–1.025)
Urobilinogen, UA: 0.2 U/dL
pH, UA: 6 (ref 5.0–8.0)

## 2023-12-06 MED ORDER — MECLIZINE HCL 12.5 MG PO TABS
12.5000 mg | ORAL_TABLET | Freq: Three times a day (TID) | ORAL | 0 refills | Status: DC | PRN
  Filled 2023-12-06: qty 30, 10d supply, fill #0

## 2023-12-06 NOTE — Progress Notes (Signed)
 Acute Office Visit  Subjective:     Patient ID: Kathleen [name], female    DOB: [birth year], 78 y.o.   MRN: [id number]  Chief Complaint  Patient presents with   Hospitalization Follow-up    Was told she has vertigo, has still been having dizzy episodes    HPI Follow up Hospitalization  Patient was admitted to [facility] hospital on 11/26/23 and discharged on 11/29/23. She was treated for Acute UTI, vertigo. Treatment for this included IV antibiotics, Epley maneuver by PT. Telephone follow up was done on 11/30/23 She reports good compliance with treatment. She reports this condition is improved.  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 Dizziness was likely secondary to BPPV, improved after Epley's maneuver, done by PT. Dced on prn meclizine . Advised to take Torsemide  20 mg daily and her heart failure nurse can adjust the dose if necessary. She does have h/o HFrEF but repeat ECHO done  during this visit improved EF 45-50%. She was discharged back to [facility].  Reports that she is still having some urinary frequency and urgency. Denies pain, hematuria, chills, fever, flank pain.    ROS Per HPI      Objective:    BP 134/80 (BP Location: Left Arm, Patient Position: Sitting)   Pulse 77   Temp 98.7 F (37.1 C) (Temporal)   Ht 5' (1.524 m)   Wt 187 lb (84.8 kg)   SpO2 96%   BMI 36.52 kg/m    Physical Exam Vitals and nursing note reviewed.  Constitutional:      General: She is not in acute distress.    Appearance: Normal appearance. She is obese.     Comments: Chronically ill-appearing  HENT:     Head: Normocephalic and atraumatic.     Right Ear: External ear normal.     Left Ear: External ear normal.     Nose: Nose normal.     Comments: Nasal cannula in place    Mouth/Throat:     Mouth: Mucous membranes are moist.     Pharynx: Oropharynx is clear.  Eyes:     Extraocular Movements: Extraocular movements intact.     Pupils: Pupils are equal, round, and reactive to light.  Cardiovascular:     Rate and Rhythm: Normal rate and regular rhythm.     Pulses: Normal pulses.     Heart sounds: Normal heart sounds.  Pulmonary:     Effort: Pulmonary effort is normal. No respiratory distress.     Breath sounds: Normal breath sounds. No wheezing, rhonchi or rales.     Comments: On continuous oxygen  2 LPM, nasal cannula in place Abdominal:  Comments: Colostomy in place  Musculoskeletal:     Cervical back: Normal range of motion.     Right lower leg: No edema.     Left lower leg: No edema.     Comments: Using rollator walker  Lymphadenopathy:     Cervical: No cervical adenopathy.  Neurological:     General: No focal deficit present.     Mental Status: She is alert and oriented to person, place, and time.  Psychiatric:        Mood and Affect: Mood normal.        Thought Content: Thought content normal.     Results for orders placed  or performed in visit on 12/06/23  POCT URINALYSIS DIP (CLINITEK)  Result Value Ref Range   Color, UA     Clarity, UA     Glucose, UA negative negative mg/dL   Bilirubin, UA negative negative   Ketones, POC UA negative negative mg/dL   Spec Grav, UA 8.984 8.989 - 1.025   Blood, UA small (A) negative   pH, UA 6.0 5.0 - 8.0   POC PROTEIN,UA negative negative, trace   Urobilinogen, UA 0.2 0.2 or 1.0 E.U./dL   Nitrite, UA Negative Negative   Leukocytes, UA Large (3+) (A) Negative        Assessment & Plan:   BPPV (benign paroxysmal positional vertigo), bilateral Assessment & Plan: Continue Epley maneuver with home current PT Meclizine  to pharmacy  Orders: -     Meclizine  HCl; Take 1 tablet (12.5 mg total) by mouth 3 (three) times daily as needed for dizziness.  Dispense: 30 tablet; Refill: 0  Acute cystitis without hematuria Assessment & Plan: UA and culture today Finish out cefdinir  course  Orders: -     POCT URINALYSIS DIP (CLINITEK)  Chronic combined systolic and diastolic heart failure (HCC) Assessment & Plan: Stable, continue current medication regimen Follow-up cardiology as scheduled   Longstanding persistent atrial fibrillation (HCC) Assessment & Plan: Stable, continue Eliquis , Tikosyn  Follow-up with cardiology as scheduled   Chronic respiratory failure with hypoxia (HCC) Assessment & Plan: Improved since hospitalization, continue continuous oxygen  use Follow pulmonology as scheduled Continue current medication regimen   Type 2 diabetes mellitus with diabetic neuropathy, without long-term current use of insulin  (HCC) Assessment & Plan: Stable, continue current med regimen   Long term (current) use of anticoagulants Assessment & Plan: Continue Eliquis  Follow-up cardiology as scheduled   Hypercoagulable state [facility]) Assessment & Plan: Continue Eliquis  Follow-up cardiology as scheduled   Colostomy in place [facility]) Assessment & Plan: Stable,  follow-up with GI as scheduled      Meds ordered this encounter  Medications   meclizine  (ANTIVERT ) 12.5 MG tablet    Sig: Take 1 tablet (12.5 mg total) by mouth 3 (three) times daily as needed for dizziness.    Dispense:  30 tablet    Refill:  0    Return if symptoms worsen or fail to improve, for With specialist and PCP as scheduled.  Corean LITTIE Ku, FNP

## 2023-12-07 ENCOUNTER — Other Ambulatory Visit: Payer: Self-pay | Admitting: Internal Medicine

## 2023-12-07 ENCOUNTER — Other Ambulatory Visit

## 2023-12-07 ENCOUNTER — Other Ambulatory Visit: Payer: Self-pay | Admitting: Family Medicine

## 2023-12-07 DIAGNOSIS — N3000 Acute cystitis without hematuria: Secondary | ICD-10-CM

## 2023-12-08 LAB — URINE CULTURE: Result:: NO GROWTH

## 2023-12-09 ENCOUNTER — Ambulatory Visit: Payer: Self-pay | Admitting: Family Medicine

## 2023-12-12 ENCOUNTER — Encounter: Payer: Self-pay | Admitting: Family Medicine

## 2023-12-12 DIAGNOSIS — Z7901 Long term (current) use of anticoagulants: Secondary | ICD-10-CM | POA: Insufficient documentation

## 2023-12-12 NOTE — Assessment & Plan Note (Addendum)
 UA and culture today Finish out cefdinir  course

## 2023-12-12 NOTE — Assessment & Plan Note (Signed)
 Continue Eliquis  Follow-up cardiology as scheduled

## 2023-12-12 NOTE — Assessment & Plan Note (Signed)
Stable, continue current med regimen.

## 2023-12-12 NOTE — Patient Instructions (Addendum)
 We sent your urine for culture.    Will be in contact with the results once they are received.  Continue and finish out your cefdinir  course.  Continue use of oxygen   Continue current medication regimen.  Follow-up with us  as needed, with PCP and specialists as scheduled.

## 2023-12-12 NOTE — Assessment & Plan Note (Signed)
 Continue Epley maneuver with home current PT Meclizine  to pharmacy

## 2023-12-12 NOTE — Assessment & Plan Note (Signed)
 Stable, continue Eliquis , Tikosyn  Follow-up with cardiology as scheduled

## 2023-12-12 NOTE — Assessment & Plan Note (Signed)
 Improved since hospitalization, continue continuous oxygen  use Follow pulmonology as scheduled Continue current medication regimen

## 2023-12-12 NOTE — Assessment & Plan Note (Signed)
 Stable, follow-up with GI as scheduled

## 2023-12-12 NOTE — Assessment & Plan Note (Signed)
 Stable, continue current medication regimen Follow-up cardiology as scheduled

## 2023-12-15 ENCOUNTER — Telehealth (HOSPITAL_COMMUNITY): Payer: Self-pay | Admitting: Cardiology

## 2023-12-15 ENCOUNTER — Ambulatory Visit: Payer: Self-pay

## 2023-12-15 ENCOUNTER — Telehealth: Payer: Self-pay | Admitting: Internal Medicine

## 2023-12-15 NOTE — Telephone Encounter (Signed)
 LM on secure line ok for verbal orders

## 2023-12-15 NOTE — Telephone Encounter (Signed)
 yes

## 2023-12-15 NOTE — Telephone Encounter (Signed)
 Copied from CRM [id number]. Topic: Clinical - Home Health Verbal Orders >> Dec 15, 2023  9:40 AM Rosina BIRCH wrote: Caller/Agency: charice from [facility] Callback Number: [phone] Service Requested: Physical Therapy Frequency: one week one, two week three, one week one Any new concerns about the patient? No

## 2023-12-15 NOTE — Telephone Encounter (Signed)
 Sharyice with home PT called with FYI  Patients weight increased x 3lb overnight 7/8 183 7/9 186  Medication compliance noted   Please advise otherwise message above is [employer]

## 2023-12-15 NOTE — Telephone Encounter (Signed)
 FYI Only or Action Required?: Action required by provider: update on patient condition.  Patient was last seen in primary care on 12/06/2023 by Alvia Corean CROME, FNP.  Called Nurse Triage reporting Depression.  Symptoms began today.  Interventions attempted: Nothing.  Symptoms are: unchanged.  Triage Disposition: See PCP When Office is Open (Within 3 Days)  Patient/caregiver understands and will follow disposition?: No  Additional info:  Received message that Charise from [facility] called to report depression symptoms and is requesting pcp recommendation. This Clinical research associate called and spoke with patient re: depression concerns. She chuckled and stated she has low mood due to recent surgery. She has a new ostomy due to perforated colon and feels the low mood is due to this, she plans to call ostomy doctor to inquire on support groups. Patient states she will call back PCP office for any questions, concerns, or if she needs assistance.  Please note Charise from [facility] is requesting Dr. Joshua recommendation.    Copied from CRM [id number]. Topic: Clinical - Medical Advice >> Dec 15, 2023 10:52 AM Precious C wrote: Reason for CRM: Charise from [facility] called is regarding patient. She reported the patient scored a 6 on depression screening, indicating mild depression. She states the pt has been crying and showing no interest in activities. Arville is requesting from the provider to provide recommendations regarding the patient's emotional state. She can be reached at [phone]. Reason for Disposition  Requesting to talk with a counselor (mental health worker, psychiatrist, etc.)    Plans to call surgeon for recommendation of any local support groups for new ostomy.  Protocols used: Depression-A-AH

## 2023-12-15 NOTE — Telephone Encounter (Signed)
 Pt aware and voiced understanding

## 2023-12-15 NOTE — Telephone Encounter (Signed)
 Please call. Instruct to take torsemide  40 mg daily x 2 days then back to 20 mg daily.   Thanks ,  Ladonne Sharples NP-C

## 2023-12-17 NOTE — Telephone Encounter (Signed)
 Unable to reach the home health nurse. But I was able to speak with the patient and she stated that she will speak to somebody at the ostomy clinic because all this didn't come about until after her big operation. I gave a verbal understanding and advised her to reach back out to us  if she needed anything.

## 2023-12-19 ENCOUNTER — Encounter: Payer: Self-pay | Admitting: Sports Medicine

## 2023-12-27 ENCOUNTER — Encounter

## 2023-12-29 ENCOUNTER — Ambulatory Visit: Payer: Medicare Other

## 2023-12-29 DIAGNOSIS — I442 Atrioventricular block, complete: Secondary | ICD-10-CM | POA: Diagnosis not present

## 2023-12-30 LAB — CUP PACEART REMOTE DEVICE CHECK
Battery Remaining Longevity: 71 mo
Battery Voltage: 2.97 V
Brady Statistic AP VP Percent: 78.58 %
Brady Statistic AP VS Percent: 0.01 %
Brady Statistic AS VP Percent: 21.41 %
Brady Statistic AS VS Percent: 0 %
Brady Statistic RA Percent Paced: 78.51 %
Brady Statistic RV Percent Paced: 99.98 %
Date Time Interrogation Session: 20250723003611
Implantable Lead Connection Status: 753985
Implantable Lead Connection Status: 753985
Implantable Lead Implant Date: 20150527
Implantable Lead Implant Date: 20211221
Implantable Lead Location: 753858
Implantable Lead Location: 753860
Implantable Lead Model: 3830
Implantable Lead Model: 5076
Implantable Pulse Generator Implant Date: 20211221
Lead Channel Impedance Value: 285 Ohm
Lead Channel Impedance Value: 304 Ohm
Lead Channel Impedance Value: 304 Ohm
Lead Channel Impedance Value: 361 Ohm
Lead Channel Impedance Value: 361 Ohm
Lead Channel Impedance Value: 380 Ohm
Lead Channel Impedance Value: 437 Ohm
Lead Channel Impedance Value: 437 Ohm
Lead Channel Impedance Value: 475 Ohm
Lead Channel Pacing Threshold Amplitude: 0.75 V
Lead Channel Pacing Threshold Amplitude: 0.75 V
Lead Channel Pacing Threshold Amplitude: 0.75 V
Lead Channel Pacing Threshold Pulse Width: 0.4 ms
Lead Channel Pacing Threshold Pulse Width: 0.4 ms
Lead Channel Pacing Threshold Pulse Width: 0.4 ms
Lead Channel Sensing Intrinsic Amplitude: 1.375 mV
Lead Channel Sensing Intrinsic Amplitude: 1.375 mV
Lead Channel Sensing Intrinsic Amplitude: 6.25 mV
Lead Channel Sensing Intrinsic Amplitude: 6.25 mV
Lead Channel Setting Pacing Amplitude: 1.25 V
Lead Channel Setting Pacing Amplitude: 1.5 V
Lead Channel Setting Pacing Amplitude: 2 V
Lead Channel Setting Pacing Pulse Width: 0.4 ms
Lead Channel Setting Pacing Pulse Width: 0.4 ms
Lead Channel Setting Sensing Sensitivity: 2 mV
Zone Setting Status: 755011

## 2024-01-02 ENCOUNTER — Encounter (HOSPITAL_COMMUNITY): Payer: Self-pay | Admitting: *Deleted

## 2024-01-02 ENCOUNTER — Emergency Department (HOSPITAL_COMMUNITY)
Admission: EM | Admit: 2024-01-02 | Discharge: 2024-01-02 | Disposition: A | Discharge status: Home / Self Care | Attending: Emergency Medicine | Admitting: Emergency Medicine

## 2024-01-02 ENCOUNTER — Other Ambulatory Visit: Payer: Self-pay

## 2024-01-02 DIAGNOSIS — D72829 Elevated white blood cell count, unspecified: Secondary | ICD-10-CM | POA: Diagnosis not present

## 2024-01-02 DIAGNOSIS — R350 Frequency of micturition: Secondary | ICD-10-CM | POA: Diagnosis present

## 2024-01-02 DIAGNOSIS — Z7901 Long term (current) use of anticoagulants: Secondary | ICD-10-CM | POA: Insufficient documentation

## 2024-01-02 DIAGNOSIS — N390 Urinary tract infection, site not specified: Secondary | ICD-10-CM | POA: Diagnosis not present

## 2024-01-02 LAB — COMPREHENSIVE METABOLIC PANEL WITH GFR
ALT: 8 U/L (ref 0–44)
AST: 13 U/L — ABNORMAL LOW (ref 15–41)
Albumin: 3.5 g/dL (ref 3.5–5.0)
Alkaline Phosphatase: 51 U/L (ref 38–126)
Anion gap: 10 (ref 5–15)
BUN: 17 mg/dL (ref 8–23)
CO2: 26 mmol/L (ref 22–32)
Calcium: 9.6 mg/dL (ref 8.9–10.3)
Chloride: 101 mmol/L (ref 98–111)
Creatinine, Ser: 0.77 mg/dL (ref 0.44–1.00)
GFR, Estimated: >60 mL/min (ref >60)
Glucose, Bld: 101 mg/dL — ABNORMAL HIGH (ref 70–99)
Potassium: 4 mmol/L (ref 3.5–5.1)
Sodium: 137 mmol/L (ref 135–145)
Total Bilirubin: 0.4 mg/dL (ref 0.0–1.2)
Total Protein: 6.4 g/dL — ABNORMAL LOW (ref 6.5–8.1)

## 2024-01-02 LAB — CBC
HCT: 39.4 % (ref 36.0–46.0)
Hemoglobin: 12.2 g/dL (ref 12.0–15.0)
MCH: 26.1 pg (ref 26.0–34.0)
MCHC: 31 g/dL (ref 30.0–36.0)
MCV: 84.2 fL (ref 80.0–100.0)
Platelets: 214 K/uL (ref 150–400)
RBC: 4.68 MIL/uL (ref 3.87–5.11)
RDW: 15.2 % (ref 11.5–15.5)
WBC: 11 K/uL — ABNORMAL HIGH (ref 4.0–10.5)
nRBC: 0 % (ref 0.0–0.2)

## 2024-01-02 LAB — URINALYSIS, ROUTINE W REFLEX MICROSCOPIC
Bilirubin Urine: NEGATIVE
Glucose, UA: NEGATIVE mg/dL
Ketones, ur: NEGATIVE mg/dL
Nitrite: POSITIVE — AB
Protein, ur: 100 mg/dL — AB
RBC / HPF: >50 RBC/hpf (ref 0–5)
Specific Gravity, Urine: 1.015 (ref 1.005–1.030)
WBC, UA: >50 WBC/hpf (ref 0–5)
pH: 5 (ref 5.0–8.0)

## 2024-01-02 LAB — LIPASE, BLOOD: Lipase: 28 U/L (ref 11–51)

## 2024-01-02 MED ORDER — CIPROFLOXACIN HCL 500 MG PO TABS
500.0000 mg | ORAL_TABLET | Freq: Once | ORAL | Status: AC
  Administered 2024-01-02: 500 mg via ORAL
  Filled 2024-01-02: qty 1

## 2024-01-02 MED ORDER — CEPHALEXIN 250 MG PO CAPS
500.0000 mg | ORAL_CAPSULE | Freq: Once | ORAL | Status: DC
Start: 2024-01-02 — End: 2024-01-02
  Filled 2024-01-02: qty 2

## 2024-01-02 MED ORDER — PHENAZOPYRIDINE HCL 95 MG PO TABS
95.0000 mg | ORAL_TABLET | Freq: Three times a day (TID) | ORAL | 0 refills | Status: DC | PRN
Start: 2024-01-02 — End: 2024-01-03
  Filled 2024-01-02: qty 10, 4d supply, fill #0

## 2024-01-02 MED ORDER — CEPHALEXIN 500 MG PO CAPS
500.0000 mg | ORAL_CAPSULE | Freq: Four times a day (QID) | ORAL | 0 refills | Status: DC
  Filled 2024-01-02: qty 20, 5d supply, fill #0

## 2024-01-02 MED ORDER — PHENAZOPYRIDINE HCL 100 MG PO TABS
95.0000 mg | ORAL_TABLET | Freq: Once | ORAL | Status: AC
  Administered 2024-01-02: 100 mg via ORAL
  Filled 2024-01-02: qty 1

## 2024-01-02 MED ORDER — CIPROFLOXACIN HCL 500 MG PO TABS
500.0000 mg | ORAL_TABLET | Freq: Two times a day (BID) | ORAL | 0 refills | Status: DC
Start: 2024-01-02 — End: 2024-01-03
  Filled 2024-01-02: qty 10, 5d supply, fill #0

## 2024-01-02 NOTE — ED Provider Notes (Signed)
 [place] EMERGENCY DEPARTMENT AT [facility] Provider Note   CSN: [id number] Arrival date & time: 01/02/24  1847    Patient presents with: Urinary Frequency  Kathleen [name] is a 78 y.o. female here for evaluation of urinary frequency.  Patient states over the last 2 days she has been having frequent small voids of urine.  She feels like she is not able to fully view her bladder.  No hematuria.  No pain in her kidneys.  She is something similar like this previously and she needed a Foley catheter placed.  No fever, nausea, vomiting, chest pain, shortness of breath, flank pain.  She is on 4 L via nasal cannula at baseline.  She denies any change in this.  Chronically anticoagulated for A-fib.  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  Reviewed her surgery notes and recent visit with Dr. Ebbie, unable to close the fistula due to nature of tissue as well as overall health status.   HPI     Prior to Admission medications   Medication Sig Start Date End Date Taking? Authorizing Provider  ciprofloxacin  (CIPRO ) 500 MG tablet Take 1 tablet (500 mg total) by mouth every 12 (twelve) hours. 01/02/24  Yes Adrin Julian A, PA-C  phenazopyridine  (PYRIDIUM ) 95 MG tablet Take 1 tablet (95 mg total) by mouth 3 (three) times daily as needed for pain. 01/02/24  Yes Meriel Kelliher A, PA-C  Cholecalciferol  (VITAMIN D3) 50 MCG (2000 UT) TABS Take 2,000 Units by mouth at bedtime.    [provider]  cyanocobalamin  (VITAMIN B12) 1000 MCG tablet Take 1,000 mcg by mouth daily.    [provider]  docusate sodium  (COLACE) 100 MG capsule Take 100 mg by mouth daily.    [provider]  dofetilide  (TIKOSYN ) 250 MCG capsule Take 1 capsule (250 mcg total) by mouth in the morning and at bedtime. 07/23/23 07/22/24  Rolan Ezra RAMAN, MD  ELIQUIS  5 MG TABS tablet Take 1 tablet (5 mg total) by mouth 2  (two) times daily. 09/23/23   Bensimhon, Toribio SAUNDERS, MD  febuxostat  (ULORIC ) 40 MG tablet Take 1 tablet (40 mg total) by mouth daily. 10/06/23   Rice, Lonni ORN, MD  fluticasone  furoate-vilanterol (BREO ELLIPTA ) 100-25 MCG/ACT AEPB INHALE 1 PUFF INTO THE LUNGS DAILY 03/23/23   Jude Harden GAILS, MD  gabapentin  (NEURONTIN ) 300 MG capsule Take 1 capsule (300 mg total) by mouth at bedtime. 09/23/23 09/17/24  Gregg Lek, MD  ketoconazole  (NIZORAL ) 2 % cream Apply 1 Application topically 2 (two) times daily. 10/21/23   Joshua Debby CROME, MD  levalbuterol  (XOPENEX ) 0.63 MG/3ML nebulizer solution Take 3 mLs (0.63 mg total) by nebulization every 6 (six) hours as needed for wheezing or shortness of breath. DX J44.89 J96.21 01/20/23   Jude Harden GAILS, MD  Magnesium  500 MG TABS Take 500 mg by mouth every evening.    [provider]  meclizine  (ANTIVERT ) 12.5 MG tablet Take 1 tablet (12.5 mg total) by mouth 3 (three) times daily as needed for dizziness. 12/06/23   Alvia Corean CROME, FNP  mupirocin  ointment (BACTROBAN ) 2 % Apply 1 Application topically 2 (two) times daily. 10/22/23   Joshua Debby CROME, MD  naloxone  (NARCAN ) nasal spray 4 mg/0.1 mL Place 1 spray into the nose once as needed (opioid overdose). 11/12/23   [provider]  oxyCODONE  (OXY IR/ROXICODONE ) 5 MG immediate release tablet Take 1-2 tablets (5-10 mg total) by  mouth every 6 (six) hours as needed for moderate pain (pain score 4-6) or severe pain (pain score 7-10) (5mg  moderate, 10mg  severe). 05/27/23   Singh, Prashant K, MD  oxyCODONE  (OXY IR/ROXICODONE ) 5 MG immediate release tablet 1 po q 8-12hrs prn pain 10/18/23   Addie Cordella Hamilton, MD  oxyCODONE -acetaminophen  (PERCOCET/ROXICET) 5-325 MG tablet Take 1 tablet by mouth every 8 (eight) hours as needed for moderate pain (pain score 4-6). 11/16/23   [provider]  OXYGEN  Inhale 4 L into the lungs continuous. Use with resmed ventilator    [provider]  potassium  chloride (KLOR-CON ) 10 MEQ tablet TAKE 1 TABLET(10 MEQ) BY MOUTH THREE TIMES DAILY 11/15/23   Joshua Debby CROME, MD  revefenacin  (YUPELRI ) 175 MCG/3ML nebulizer solution Take 175 mcg by nebulization daily.    [provider]  spironolactone  (ALDACTONE ) 25 MG tablet Take 12.5 mg by mouth every evening.    [provider]  torsemide  (DEMADEX ) 20 MG tablet Take 1 tablet (20 mg total) by mouth daily. 11/29/23   Rashid, Farhan, MD  triamcinolone  cream (KENALOG ) 0.1 % Apply 1 Application topically 2 (two) times daily. 07/02/23   Cyndi Shaver, PA-C  Zinc  Oxide (DESITIN MAXIMUM STRENGTH) 40 % PSTE Apply 1 Application topically 4 (four) times daily. Mix with aquaphor and apply to inflamed area    [provider]    Allergies: Allopurinol, Clindamycin, Flublok [influenza vaccine recombinant], Pneumococcal 13-val conj vacc, Dronedarone, Montelukast , Brovana  [arformoterol ], Budesonide , Entresto  [sacubitril -valsartan ], Fosamax [alendronate sodium], Jardiance  [empagliflozin ], Meperidine, Microplegia msa-msg [plegisol], Rosuvastatin, Tetracycline, Adhesive [tape], and Lovastatin    Review of Systems  Constitutional: Negative.   HENT: Negative.    Respiratory: Negative.    Cardiovascular: Negative.   Gastrointestinal: Negative.   Genitourinary:  Positive for decreased urine volume, difficulty urinating, dysuria, frequency and urgency. Negative for flank pain, pelvic pain, vaginal discharge and vaginal pain.  Musculoskeletal: Negative.   Skin: Negative.   Neurological: Negative.   All other systems reviewed and are negative.  Updated Vital Signs BP 117/84 (BP Location: Left Arm)   Pulse 70   Temp 98.2 F (36.8 C)   Resp 20   Ht 5' (1.524 m)   Wt 84.8 kg   SpO2 100%   BMI 36.51 kg/m   Physical Exam Vitals and nursing note reviewed.  Constitutional:      General: She is not in acute distress.    Appearance: She is well-developed. She is not ill-appearing, toxic-appearing or  diaphoretic.  HENT:     Head: Atraumatic.  Eyes:     Pupils: Pupils are equal, round, and reactive to light.  Cardiovascular:     Rate and Rhythm: Normal rate.     Pulses: Normal pulses.     Heart sounds: Normal heart sounds.  Pulmonary:     Effort: Pulmonary effort is normal. No respiratory distress.     Breath sounds: Normal breath sounds.     Comments: 4 L [place] at baseline Abdominal:     General: There is no distension.     Tenderness: There is no abdominal tenderness. There is no right CVA tenderness, left CVA tenderness, guarding or rebound.  Musculoskeletal:        General: Normal range of motion.     Cervical back: Normal range of motion.  Skin:    General: Skin is warm and dry.  Neurological:     General: No focal deficit present.     Mental Status: She is alert.  Psychiatric:  Mood and Affect: Mood normal.    (all labs ordered are listed, but only abnormal results are displayed) Labs Reviewed  COMPREHENSIVE METABOLIC PANEL WITH GFR - Abnormal; Notable for the following components:      Result Value   Glucose, Bld 101 (*)    Total Protein 6.4 (*)    AST 13 (*)    All other components within normal limits  CBC - Abnormal; Notable for the following components:   WBC 11.0 (*)    All other components within normal limits  URINALYSIS, ROUTINE W REFLEX MICROSCOPIC - Abnormal; Notable for the following components:   APPearance CLOUDY (*)    Hgb urine dipstick MODERATE (*)    Protein, ur 100 (*)    Nitrite POSITIVE (*)    Leukocytes,Ua LARGE (*)    Bacteria, UA MANY (*)    All other components within normal limits  LIPASE, BLOOD    EKG: None  Radiology: No results found.   Procedures   Medications Ordered in the ED  phenazopyridine  (PYRIDIUM ) tablet 100 mg (100 mg Oral Given 01/02/24 2154)  ciprofloxacin  (CIPRO ) tablet 500 mg (500 mg Oral Given 01/02/24 8016)    78 year old here for evaluation of dysuria, urgency, frequency.  About 2 days ago.  She is  afebrile, nonseptic, non-ill-appearing.  She has chronic hypoxic respiratory failure which is at baseline.  Her heart and lungs are clear.  Her abdomen has some very minimal suprapubic tenderness however negative CVA tap bilaterally.  She is hemodynamically stable.  Will plan on checking labs, urine.  She does feel like she is unable to completely empty her bladder will get postvoid residual.  Labs personally viewed and interpreted:  CBC leukocytosis 11.0 Metabolic panel without significant abnormality Lipase 28 UA positive for infection  Postvoid residual 0 mL left in bladder.  Will treat for UTI.  Patient states that cephalosporins have not worked previously requesting ciprofloxacin .  Will start on Cipro  however I did discuss with them this can sometimes cause confusion that is side effect.  Will have her follow-up with her PCP.  Will return for any worsening symptoms.  Do not feel she needs additional labs or imaging at this time.  Patient is nontoxic, nonseptic appearing, in no apparent distress.  Patient's pain and other symptoms adequately managed in emergency department.  Fluid bolus given.  Labs, imaging and vitals reviewed.  Patient does not meet the SIRS or Sepsis criteria.  On repeat exam patient does not have a surgical abdomin and there are no peritoneal signs.  No indication of appendicitis, bowel obstruction, bowel perforation, cholecystitis, diverticulitis, PID, intermittent, persistent torsion, TOA, ectopic pregnancy, AAA, dissection, traumatic injury.  Patient discharged home with symptomatic treatment and given strict instructions for follow-up with their primary care physician.  I have also discussed reasons to return immediately to the ER.  Patient expresses understanding and agrees with plan.  Patient seen by attending, Dr. Dean who is agreeable with the above treatment, plan and disposition                                    Medical Decision Making Amount and/or  Complexity of Data Reviewed Independent Historian:     Details: Son in room External Data Reviewed: labs, radiology and notes. Labs: ordered. Decision-making details documented in ED Course.  Risk OTC drugs. Prescription drug management. Decision regarding hospitalization. Diagnosis or treatment significantly limited by social determinants  of health.       Final diagnoses:  Lower urinary tract infectious disease    ED Discharge Orders          Ordered    cephALEXin  (KEFLEX ) 500 MG capsule  4 times daily,   Status:  Discontinued        01/02/24 2147    ciprofloxacin  (CIPRO ) 500 MG tablet  Every 12 hours        01/02/24 2155    phenazopyridine  (PYRIDIUM ) 95 MG tablet  3 times daily PRN        01/02/24 2156               Reuel Lamadrid A, PA-C 01/02/24 2305    Dean Clarity, MD 01/02/24 2339

## 2024-01-02 NOTE — Discharge Instructions (Addendum)
 I have prescribed an antibiotic.  Take as prescribed.  Please use caution as sometimes ciprofloxacin  can cause altered mental status as a side effect.  Return for any worsening symptoms

## 2024-01-02 NOTE — ED Triage Notes (Signed)
 The pt has had painful urination for 2 days voiding frequently  with small amounts  no temp

## 2024-01-03 ENCOUNTER — Ambulatory Visit: Payer: Self-pay | Admitting: Cardiology

## 2024-01-03 ENCOUNTER — Emergency Department (HOSPITAL_COMMUNITY)
Admission: EM | Admit: 2024-01-03 | Discharge: 2024-01-03 | Disposition: A | Discharge status: Home / Self Care | Attending: Emergency Medicine | Admitting: Emergency Medicine

## 2024-01-03 ENCOUNTER — Other Ambulatory Visit: Payer: Self-pay | Admitting: Internal Medicine

## 2024-01-03 ENCOUNTER — Encounter

## 2024-01-03 ENCOUNTER — Other Ambulatory Visit: Payer: Self-pay

## 2024-01-03 ENCOUNTER — Encounter (HOSPITAL_COMMUNITY): Payer: Self-pay

## 2024-01-03 ENCOUNTER — Other Ambulatory Visit (HOSPITAL_BASED_OUTPATIENT_CLINIC_OR_DEPARTMENT_OTHER): Payer: Self-pay

## 2024-01-03 ENCOUNTER — Ambulatory Visit: Payer: Self-pay

## 2024-01-03 DIAGNOSIS — Z7901 Long term (current) use of anticoagulants: Secondary | ICD-10-CM | POA: Insufficient documentation

## 2024-01-03 DIAGNOSIS — N3001 Acute cystitis with hematuria: Secondary | ICD-10-CM | POA: Insufficient documentation

## 2024-01-03 DIAGNOSIS — Z760 Encounter for issue of repeat prescription: Secondary | ICD-10-CM | POA: Insufficient documentation

## 2024-01-03 LAB — URINALYSIS, ROUTINE W REFLEX MICROSCOPIC
Bilirubin Urine: NEGATIVE
Glucose, UA: NEGATIVE mg/dL
Ketones, ur: NEGATIVE mg/dL
Nitrite: NEGATIVE
Protein, ur: 30 mg/dL — AB
Specific Gravity, Urine: 1.008 (ref 1.005–1.030)
WBC, UA: >50 WBC/hpf (ref 0–5)
pH: 5 (ref 5.0–8.0)

## 2024-01-03 MED ORDER — PHENAZOPYRIDINE HCL 200 MG PO TABS: 200.0000 mg | ORAL_TABLET | Freq: Three times a day (TID) | ORAL | 0 refills | Status: DC

## 2024-01-03 MED ORDER — CIPROFLOXACIN HCL 500 MG PO TABS: 500.0000 mg | ORAL_TABLET | Freq: Two times a day (BID) | ORAL | 0 refills | Status: DC

## 2024-01-03 NOTE — ED Provider Notes (Signed)
 [place] EMERGENCY DEPARTMENT AT [facility] Provider Note   CSN: [id number] Arrival date & time: 01/03/24  1814     Patient presents with: Medication Refill   Kathleen [name] is a 78 y.o. female.   78 year old female presents today for concern of not being able to get her medications filled that were prescribed to her yesterday.  She was diagnosed with a UTI.  She states she went to pick up the medications but was told that these medications were canceled.  Today she has been trying to get these filled through her PCP but this was not done.  Denies any worsening symptoms.  States she would just like refills on her medications.  No fever.  The history is provided by the patient. No language interpreter was used.       Prior to Admission medications   Medication Sig Start Date End Date Taking? Authorizing Provider  phenazopyridine  (PYRIDIUM ) 200 MG tablet Take 1 tablet (200 mg total) by mouth 3 (three) times daily. 01/03/24  Yes Melenie Minniear, PA-C  Cholecalciferol  (VITAMIN D3) 50 MCG (2000 UT) TABS Take 2,000 Units by mouth at bedtime.    [provider]  ciprofloxacin  (CIPRO ) 500 MG tablet Take 1 tablet (500 mg total) by mouth every 12 (twelve) hours. 01/03/24   Hildegard Loge, PA-C  cyanocobalamin  (VITAMIN B12) 1000 MCG tablet Take 1,000 mcg by mouth daily.    [provider]  docusate sodium  (COLACE) 100 MG capsule Take 100 mg by mouth daily.    [provider]  dofetilide  (TIKOSYN ) 250 MCG capsule Take 1 capsule (250 mcg total) by mouth in the morning and at bedtime. 07/23/23 07/22/24  Rolan Ezra RAMAN, MD  ELIQUIS  5 MG TABS tablet Take 1 tablet (5 mg total) by mouth 2 (two) times daily. 09/23/23   Bensimhon, Toribio SAUNDERS, MD  febuxostat  (ULORIC ) 40 MG tablet Take 1 tablet (40 mg total) by mouth daily. 10/06/23   Rice, Lonni ORN, MD  fluticasone  furoate-vilanterol (BREO ELLIPTA ) 100-25 MCG/ACT AEPB INHALE 1 PUFF INTO THE LUNGS DAILY 03/23/23   Jude Harden GAILS, MD  gabapentin  (NEURONTIN ) 300 MG capsule Take 1 capsule (300 mg total) by mouth at bedtime. 09/23/23 09/17/24  Gregg Lek, MD  ketoconazole  (NIZORAL ) 2 % cream Apply 1 Application topically 2 (two) times daily. 10/21/23   Joshua Debby CROME, MD  levalbuterol  (XOPENEX ) 0.63 MG/3ML nebulizer solution Take 3 mLs (0.63 mg total) by nebulization every 6 (six) hours as needed for wheezing or shortness of breath. DX J44.89 J96.21 01/20/23   Jude Harden GAILS, MD  Magnesium  500 MG TABS Take 500 mg by mouth every evening.    [provider]  meclizine  (ANTIVERT ) 12.5 MG tablet Take 1 tablet (12.5 mg total) by mouth 3 (three) times daily as needed for dizziness. 12/06/23   Alvia Corean CROME, FNP  mupirocin  ointment (BACTROBAN ) 2 % Apply 1 Application topically 2 (two) times daily. 10/22/23   Joshua Debby CROME, MD  naloxone  (NARCAN ) nasal spray 4 mg/0.1 mL Place 1 spray into the nose once as needed (opioid overdose). 11/12/23   [provider]  oxyCODONE  (OXY IR/ROXICODONE ) 5 MG immediate release tablet Take 1-2 tablets (5-10 mg total) by mouth every 6 (six) hours as needed for moderate pain (pain score 4-6) or severe pain (pain score 7-10) (5mg  moderate, 10mg  severe). 05/27/23   Singh, Prashant K, MD  oxyCODONE  (OXY IR/ROXICODONE ) 5 MG immediate release tablet 1 po q 8-12hrs prn pain 10/18/23  Addie Cordella Hamilton, MD  oxyCODONE -acetaminophen  (PERCOCET/ROXICET) 5-325 MG tablet Take 1 tablet by mouth every 8 (eight) hours as needed for moderate pain (pain score 4-6). 11/16/23   [provider]  OXYGEN  Inhale 4 L into the lungs continuous. Use with resmed ventilator    [provider]  potassium chloride  (KLOR-CON ) 10 MEQ tablet TAKE 1 TABLET(10 MEQ) BY MOUTH THREE TIMES DAILY 11/15/23   Joshua Debby CROME, MD  revefenacin  (YUPELRI ) 175 MCG/3ML nebulizer solution Take 175 mcg by nebulization daily.    [provider]  spironolactone  (ALDACTONE ) 25 MG tablet Take 12.5 mg by mouth  every evening.    [provider]  torsemide  (DEMADEX ) 20 MG tablet Take 1 tablet (20 mg total) by mouth daily. 11/29/23   Rashid, Farhan, MD  triamcinolone  cream (KENALOG ) 0.1 % Apply 1 Application topically 2 (two) times daily. 07/02/23   Cyndi Shaver, PA-C  Zinc  Oxide (DESITIN MAXIMUM STRENGTH) 40 % PSTE Apply 1 Application topically 4 (four) times daily. Mix with aquaphor and apply to inflamed area    [provider]    Allergies: Allopurinol, Clindamycin, Flublok [influenza vaccine recombinant], Pneumococcal 13-val conj vacc, Dronedarone, Montelukast , Brovana  [arformoterol ], Budesonide , Entresto  [sacubitril -valsartan ], Fosamax [alendronate sodium], Jardiance  [empagliflozin ], Meperidine, Microplegia msa-msg [plegisol], Rosuvastatin, Tetracycline, Adhesive [tape], and Lovastatin    Review of Systems  Constitutional:  Negative for chills and fever.  Gastrointestinal:  Negative for abdominal pain.  Genitourinary:  Positive for dysuria and frequency.  All other systems reviewed and are negative.   Updated Vital Signs BP 132/64 (BP Location: Left Wrist)   Pulse 73   Temp 98.2 F (36.8 C)   Resp 20   Ht 5' (1.524 m)   Wt 84.4 kg   SpO2 96%   BMI 36.33 kg/m   Physical Exam Vitals and nursing note reviewed.  Constitutional:      General: She is not in acute distress.    Appearance: Normal appearance. She is not ill-appearing.  HENT:     Head: Normocephalic and atraumatic.     Nose: Nose normal.  Eyes:     Conjunctiva/sclera: Conjunctivae normal.  Cardiovascular:     Rate and Rhythm: Normal rate and regular rhythm.  Pulmonary:     Effort: Pulmonary effort is normal. No respiratory distress.  Abdominal:     General: There is no distension.     Palpations: Abdomen is soft.     Tenderness: There is no abdominal tenderness. There is right CVA tenderness. There is no left CVA tenderness.  Musculoskeletal:        General: No deformity. Normal range of motion.      Cervical back: Normal range of motion.  Skin:    Findings: No rash.  Neurological:     Mental Status: She is alert.     (all labs ordered are listed, but only abnormal results are displayed) Labs Reviewed  URINE CULTURE  URINALYSIS, ROUTINE W REFLEX MICROSCOPIC    EKG: None  Radiology: No results found.   Procedures   Medications Ordered in the ED - No data to display                                  Medical Decision Making Amount and/or Complexity of Data Reviewed Labs: ordered.  Risk Prescription drug management.   78 year old female presents today for needing a refill on her Cipro  and Pyridium .  These were prescribed to her yesterday  from the emergency department.  However her pharmacy could not fill these.  Her PCP was unable to get to refilling these medications for her. Hemodynamically she is stable. Has no complaints other than needing these refilled. On exam she did have right CVA tenderness which she states is new from yesterday. I offered additional blood work, CT imaging but patient declines. No urine culture sent yesterday. She was agreeable to have another urine sample collected to send a urine culture. This was done. Patient discharged in stable condition.  Son at bedside.   Final diagnoses:  Medication refill  Acute cystitis with hematuria    ED Discharge Orders          Ordered    ciprofloxacin  (CIPRO ) 500 MG tablet  Every 12 hours        01/03/24 1912    phenazopyridine  (PYRIDIUM ) 200 MG tablet  3 times daily        01/03/24 1912               Hildegard Loge, PA-C 01/03/24 1933    Cottie Donnice PARAS, MD 01/04/24 1432

## 2024-01-03 NOTE — Telephone Encounter (Unsigned)
 Copied from CRM #[id number]. Topic: Clinical - Medication Refill >> Jan 03, 2024 12:50 PM Suzen RAMAN wrote: Medication: phenazopyridine  (PYRIDIUM ) 95 MG tablet  ciprofloxacin  (CIPRO ) 500 MG tablet   !! Patient seen in ER yesterday(01/02/24) for UTI and was prescribed medication(antibiotic) to begin taken this morning. Per pharmacy a request to cancel medication was submitted to pharmacy. Pharmacy unable to provide details on which physician requested cancellation, but per patient son patient needs medication per hospital for current  symptoms to improve.!!  Has the patient contacted their pharmacy? Yes   This is the patient's preferred pharmacy:   [facility] [address] [place] [address] Phone: [phone] Fax: [phone]  Is this the correct pharmacy for this prescription? Yes If no, delete pharmacy and type the correct one.   Has the prescription been filled recently? No  Is the patient out of the medication? Yes  Has the patient been seen for an appointment in the last year OR does the patient have an upcoming appointment? Yes  Can we respond through MyChart? No- Patient son would like a call back to follow up on medication request CB#[phone]  Agent: Please be advised that Rx refills may take up to 3 business days. We ask that you follow-up with your pharmacy.

## 2024-01-03 NOTE — Discharge Instructions (Addendum)
 Will send urine culture.  Your medications have been sent into the pharmacy.  Return for any emergent symptoms. You had some new pain over your CVA/kidney area.  We discussed obtaining labs and performing CT imaging however you declined.  We will send a urine culture to ensure that what ever bacteria that is causing the infection the antibiotic is sufficient to cover this.  Please follow-up with your primary care doctor in the next several days for reevaluation.

## 2024-01-03 NOTE — Telephone Encounter (Signed)
 Called pt son who confirms pt is FYI Only or Action Required?: Action required by provider: requesting meds be filled after unexplained cancellation of ED meds, requesting call back asap. - Per pharmacy staff, appears that there's a message on here that physician requested it be cancelled, both cancelled today at 7:30 am with no indicator of who the cancelling physician was. Per pt chart order audit trails for each med, the last change to each prescription was made by Ward, RPH. No encounter in pt chart confirming the cancellation or informing the pt/family. Please advise.  Patient was last seen in primary care on 12/06/2023 by Alvia Corean CROME, FNP.  Called Nurse Triage reporting Dysuria.  Symptoms began several days ago.  Interventions attempted: Prescription medications: given first dose antibx in ED, was to continue pyridium  and ciprofloxacin  at home but communication/pharmacy issue.  Symptoms are: unchanged.  Triage Disposition: Call PCP Now  Patient/caregiver understands and will follow disposition?: Yes        Copied from CRM [id number]. Topic: Clinical - Prescription Issue >> Jan 03, 2024  3:20 PM Suzen RAMAN wrote: Reason for CRM: Patient son would like a call back pertaining to the status of medication ciprofloxacin  (CIPRO ) 500 MG tablet and phenazopyridine  (PYRIDIUM ) 95 MG tablet as patient is in need of medication due to recent hospital visit for UTI and has not had her morning dose.  RA#[phone]  Per pt chart in meds list: Order Audit Trail: ciprofloxacin  (CIPRO ) 500 MG tablet [[id number]]  Original entry by Henderly, Britni A, PA-C 01/02/2024  9:55 PM  Revision History   Medication  Order name: ciprofloxacin  (CIPRO ) 500 MG tablet  Medication: CIPROFLOXACIN  HCL 500 MG PO TABS [[id number]]  Dispense as Written: --        Clinical  Dose: 500 mg Route: Oral Frequency: Every 12 hours  Duration: -- Number of Days Supply: --   Sig: Take 1 tablet (500 mg total) by  mouth every 12 (twelve) hours.       Translated Sig: [no value] *Ward, Renay LABOR, [facility] 01/03/2024 7:44 AM  [no value]      Language: English *Ward, Ron LABOR, [facility] 01/03/2024  7:44 AM  [no value]           Order Class: Normal Priority: Routine   Dispense Quantity: 10 tablets Refills: 0   Associated Diagnoses: --  Pharmacy Associated Diagnoses: --    Dates  Written Date: 01/02/2024 Expiration Date: 01/01/2025  Start Date: 01/02/2024 End Date: --    Authorizing Provider  Name: Edie Rosebud LABOR, PA-C  Address: [address] [place] [address]  Phone #: [phone] Fax: [phone] DEA #: [id number]    Supervising Provider  Name: Cleotilde Rogue, MD  Address: [address] [place], [place] [address]  Phone #: [phone] Fax: [phone] DEA #: [id number]    Pharmacy  Note to Pharmacy: --  Owned by: [facility]  Pharmacy Comments: --      Dispense History Audit Trail  Date Type Status Number of changes Pharmacy  01/02/24 First Fill Canceled 0 [facility]  01/02/24 Ordered   [facility]   Order Audit Trail: phenazopyridine  (PYRIDIUM ) 95 MG tablet [[id number]]  Original entry by Henderly, Britni A, PA-C 01/02/2024  9:56 PM  Revision History   Medication  Order name: phenazopyridine  (PYRIDIUM ) 95 MG tablet  Medication: PHENAZOPYRIDINE  HCL 95 MG PO TABS [[id number]]  Dispense as Written: --  Clinical  Dose: 95 mg Route: Oral Frequency: 3 times daily PRN  Duration: -- Number of Days Supply: --   Sig: Take 1 tablet (95 mg total) by mouth 3 (three) times daily as needed for pain.       Translated Sig: [no value] *Ward, Renay LABOR, [facility] 01/03/2024 7:44 AM  [no value]      Language: English *Ward, Ron LABOR, [facility] 01/03/2024  7:44 AM  [no value]           Order Class: Normal Priority: Routine   Dispense Quantity: 10 tablets Refills: 0   Associated Diagnoses:  --  Pharmacy Associated Diagnoses: --    Dates  Written Date: 01/02/2024 Expiration Date: 01/01/2025  Start Date: 01/02/2024 End Date: --    Authorizing Provider  Name: Edie Rosebud LABOR, PA-C  Address: [address] [place] [address]  Phone #: [phone] Fax: [phone] DEA #: [id number]    Supervising Provider  Name: Cleotilde Rogue, MD  Address: [address] [place], [place] [address]  Phone #: [phone] Fax: [phone] DEA #: [id number]    Pharmacy  Note to Pharmacy: --  Owned by: [facility]  Pharmacy Comments: --      Dispense History Audit Trail  Date Type Status Number of changes Pharmacy  01/02/24 First Fill Canceled 0 [facility]  01/02/24 Ordered   [facility]    Reason for Disposition  [1] Caller has URGENT question AND [2] triager unable to answer question  Answer Assessment - Initial Assessment Questions 1. SYMPTOM: What's the main symptom you're concerned about? (e.g., frequency, incontinence) She's struggling, she can't really pee ER gave her a dose last night, told us  to pick up med in the morning then they didn't have it     Hurts to pee Full streams of urine out, just hurts to pee, peeing I think 3. PAIN: Is there any pain? If Yes, ask: How bad is it? (Scale: 1-10; mild, moderate, severe)     Moderate based on what she's told me 5. OTHER SYMPTOMS: Do you have any other symptoms? (e.g., blood in urine, fever, flank pain, pain with urination)     She always refers to it as spasms down there, usually when trying to pee, no fever, flank pain, or blood in urine  Not so much worse off but just as bad Takes oxycodone  for hips and shoulders, lots of other comorbidities, still having pain with UTI and all No clammy skin, rapid heart rate, or too weak to stand    Advised call back if any worsening or new symptoms,  sending message high-priority to office for call back asap with update, requesting Dr. Joshua assist with prescription issue.  Protocols used: Urinary Symptoms-A-AH, Post-Hospitalization Follow-up Call-A-AH

## 2024-01-03 NOTE — ED Triage Notes (Signed)
 Pt needs medication for UTI prescription from visit yesterday. Pts antibiotic prescription was canceled and pt needs new prescription for UTI. Pt had prescription Keflex  and Pyridium .

## 2024-01-04 ENCOUNTER — Ambulatory Visit: Attending: Cardiology

## 2024-01-04 DIAGNOSIS — Z950 Presence of cardiac pacemaker: Secondary | ICD-10-CM | POA: Diagnosis not present

## 2024-01-04 DIAGNOSIS — I5042 Chronic combined systolic (congestive) and diastolic (congestive) heart failure: Secondary | ICD-10-CM

## 2024-01-04 LAB — URINE CULTURE: Culture: NO GROWTH

## 2024-01-05 MED ORDER — TORSEMIDE 20 MG PO TABS: 20.0000 mg | ORAL_TABLET | Freq: Every day | ORAL | 2 refills | Status: DC

## 2024-01-05 MED ORDER — POTASSIUM CHLORIDE ER 10 MEQ PO TBCR: 40.0000 meq | EXTENDED_RELEASE_TABLET | Freq: Every day | ORAL | 2 refills | Status: DC

## 2024-01-05 NOTE — Progress Notes (Signed)
 Spoke with patient and advised [name], NP recommended she take Torsemide  20 mg 1 tablet daily and Potassium 10 mEq 4 tablets (40 mEq total) daily.   BMET will be drawn at 8/4 office visit.   She verbalized understanding and repeated instructions back correctly.

## 2024-01-05 NOTE — Progress Notes (Signed)
  Received: Today [place], Harlene HERO, FNP  Mckaylin Bastien, Mitzie RAMAN, RN Increase torsemide  to 20 mg daily, increase KCL to 40 daily. I will repeat labs at follow up on 8/4

## 2024-01-05 NOTE — Telephone Encounter (Signed)
 Medication has been refilled by one of the doctors in the ER and patient does have her medication.

## 2024-01-05 NOTE — Progress Notes (Signed)
 EPIC Encounter for ICM Monitoring  Patient Name: Kathleen [name] is a 78 y.o. female Date: 01/05/2024 Primary Care Physican: Kathleen Debby CROME, MD Primary Cardiologist: Kathleen [name] Electrophysiologist: Kathleen [name] Bi-V Pacing: 100%          04/06/2023 Home Weight: 176 lbs    06/30/2023 Home Weight: 178 lbs 08/02/2023 Home Weight: 172 lbs 09/15/2023   Home Weight: 177 lbs 11/11/2023   Home Weight: 176 lbs 11/17/2023 Home Weight: 182 lbs 11/22/2023 Home Weight: 185 lbs 01/05/2024 Home Weight: 190 lbs    Since 28-Dec-2023 Time in AT/AF  0.0 hr/day (0.0%)                                 Spoke with patient and heart failure questions reviewed.  Transmission results reviewed.  Pt has gained about 7 lbs in the last week and is confused about how often to take Torsemide  med.   ED visit 7/27 for UTI.   Diet:  She lives in an assisted living  Eats foods that are prepared at the facility she lives in which are not low salt.     Optivol thoracic impedance suggesting possible fluid accumulation starting 6/3.  Fluid index >normal threshold starting 6/11.   Prescribed:  Torsemide  20 mg take 1 tablet(s) (20 mg total) by mouth daily.    01/05/2024 Pt reports she has been taking Torsemide  20 mg every other day for the past 2 weeks.   Potassium 10 mEq take 1 tablet by mouth three (3) times a day  Spironolactone  25 mg take 0.5 (12.5 mg total) tablet by mouth every evening   Labs: 01/02/2024 Creatinine 0.77, BUN 17, Potassium 4.0, Sodium 137, GFR >60  11/29/2023 Creatinine 0.89, BUN 19, Potassium 3.6, Sodium 137, GFR >60  11/28/2023 Creatinine 0.66, BUN 15, Potassium 4.3, Sodium 135, GFR >60  11/27/2023 Creatinine 0.59, BUN 18, Potassium 3.6, Sodium 142, GFR >60  11/26/2023 Creatinine 0.63, BUN 19, Potassium 3.8, Sodium 139, GFR >60  09/17/2023 Creatinine 1.17, BUN 323, Potassium 3.7, Sodium 139, GFR 48  A complete set of results can be found in Results Review.   Recommendations:   Copy sent to Kathleen Gainer, NP  at [facility] clinic for review and recommendations since patient is confused how often to take Torsemide  even though script is for 20 mg daily.   Follow-up plan: ICM clinic phone appointment on 01/17/2024 to recheck fluid levels (will be rechecked at 8/4 at HF clinic).  91 day device clinic remote transmission 03/29/2024.     EP/Cardiology Office Visits:  01/10/2024 with HF Clinic.     Copy of ICM check sent to Dr. Inocencio.  3 month ICM trend: 01/03/2024.    12-14 Month ICM trend:     Kathleen GORMAN Garner, RN 01/05/2024 10:13 AM

## 2024-01-05 NOTE — Telephone Encounter (Signed)
 Duplicate message. Being handled in another telephone call.

## 2024-01-10 ENCOUNTER — Encounter (HOSPITAL_COMMUNITY): Payer: Self-pay

## 2024-01-10 ENCOUNTER — Ambulatory Visit (HOSPITAL_COMMUNITY)
Admission: RE | Admit: 2024-01-10 | Discharge: 2024-01-10 | Disposition: A | Discharge status: Home / Self Care | Source: Ambulatory Visit | Attending: Cardiology

## 2024-01-10 ENCOUNTER — Ambulatory Visit: Admitting: Sports Medicine

## 2024-01-10 ENCOUNTER — Ambulatory Visit (HOSPITAL_COMMUNITY): Payer: Self-pay | Admitting: Cardiology

## 2024-01-10 VITALS — BP 136/84 | HR 73 | Ht 60.0 in | Wt 192.0 lb

## 2024-01-10 DIAGNOSIS — G4733 Obstructive sleep apnea (adult) (pediatric): Secondary | ICD-10-CM | POA: Insufficient documentation

## 2024-01-10 DIAGNOSIS — J398 Other specified diseases of upper respiratory tract: Secondary | ICD-10-CM | POA: Diagnosis not present

## 2024-01-10 DIAGNOSIS — Z950 Presence of cardiac pacemaker: Secondary | ICD-10-CM | POA: Diagnosis not present

## 2024-01-10 DIAGNOSIS — I5022 Chronic systolic (congestive) heart failure: Secondary | ICD-10-CM | POA: Diagnosis not present

## 2024-01-10 DIAGNOSIS — E669 Obesity, unspecified: Secondary | ICD-10-CM | POA: Diagnosis not present

## 2024-01-10 DIAGNOSIS — I428 Other cardiomyopathies: Secondary | ICD-10-CM | POA: Insufficient documentation

## 2024-01-10 DIAGNOSIS — Z7901 Long term (current) use of anticoagulants: Secondary | ICD-10-CM | POA: Insufficient documentation

## 2024-01-10 DIAGNOSIS — J9611 Chronic respiratory failure with hypoxia: Secondary | ICD-10-CM | POA: Diagnosis not present

## 2024-01-10 DIAGNOSIS — Z79899 Other long term (current) drug therapy: Secondary | ICD-10-CM | POA: Insufficient documentation

## 2024-01-10 DIAGNOSIS — J9809 Other diseases of bronchus, not elsewhere classified: Secondary | ICD-10-CM | POA: Diagnosis not present

## 2024-01-10 DIAGNOSIS — J8489 Other specified interstitial pulmonary diseases: Secondary | ICD-10-CM | POA: Diagnosis not present

## 2024-01-10 DIAGNOSIS — I4819 Other persistent atrial fibrillation: Secondary | ICD-10-CM | POA: Diagnosis not present

## 2024-01-10 DIAGNOSIS — I480 Paroxysmal atrial fibrillation: Secondary | ICD-10-CM | POA: Diagnosis not present

## 2024-01-10 DIAGNOSIS — Z9981 Dependence on supplemental oxygen: Secondary | ICD-10-CM | POA: Insufficient documentation

## 2024-01-10 DIAGNOSIS — I5032 Chronic diastolic (congestive) heart failure: Secondary | ICD-10-CM | POA: Diagnosis present

## 2024-01-10 DIAGNOSIS — Z6837 Body mass index (BMI) 37.0-37.9, adult: Secondary | ICD-10-CM | POA: Diagnosis not present

## 2024-01-10 LAB — BASIC METABOLIC PANEL WITH GFR
Anion gap: 9 (ref 5–15)
BUN: 20 mg/dL (ref 8–23)
CO2: 27 mmol/L (ref 22–32)
Calcium: 10.1 mg/dL (ref 8.9–10.3)
Chloride: 104 mmol/L (ref 98–111)
Creatinine, Ser: 0.73 mg/dL (ref 0.44–1.00)
GFR, Estimated: >60 mL/min (ref >60)
Glucose, Bld: 92 mg/dL (ref 70–99)
Potassium: 4.3 mmol/L (ref 3.5–5.1)
Sodium: 140 mmol/L (ref 135–145)

## 2024-01-10 LAB — BRAIN NATRIURETIC PEPTIDE: B Natriuretic Peptide: 96.9 pg/mL (ref 0.0–100.0)

## 2024-01-10 MED ORDER — SPIRONOLACTONE 25 MG PO TABS: 25.0000 mg | ORAL_TABLET | Freq: Every evening | ORAL | 3 refills | Status: AC

## 2024-01-10 MED ORDER — TORSEMIDE 20 MG PO TABS: 40.0000 mg | ORAL_TABLET | Freq: Every day | ORAL | 3 refills | Status: AC

## 2024-01-10 NOTE — Patient Instructions (Addendum)
 Thank you for coming in today  If you had labs drawn today, any labs that are abnormal the clinic will call you No news is good news  EKG was done today   Medications: Increase Torsemide  to 40 mg daily 2 tablets Take Torsemide  to 40 mg twice daily for 1 day ONLY  Follow up appointments: Your physician recommends that you return for lab work in: 7-10 days BMET/BNP  Your physician recommends that you schedule a follow-up appointment in:  3 weeks in clinic   Do the following things EVERYDAY: Weigh yourself in the morning before breakfast. Write it down and keep it in a log. Take your medicines as prescribed Eat low salt foods--Limit salt (sodium) to 2000 mg per day.  Stay as active as you can everyday Limit all fluids for the day to less than 2 liters   At the [facility], you and your health needs are our priority. As part of our continuing mission to provide you with exceptional heart care, we have created designated Provider Care Teams. These Care Teams include your primary Cardiologist (physician) and Advanced Practice Providers (APPs- Physician Assistants and Nurse Practitioners) who all work together to provide you with the care you need, when you need it.   You may see any of the following providers on your designated Care Team at your next follow up: Dr Toribio Fuel Dr Ezra Shuck Dr. Ria Gardenia Greig Lenetta, NP Caffie Shed, [place] [facility] [place], [place] Beckey Coe, NP Tinnie Redman, PharmD   Please be sure to bring in all your medications bottles to every appointment.    Thank you for choosing [facility]  If you have any questions or concerns before your next appointment please send us  a message through [place] or call our office at [phone].    TO LEAVE A MESSAGE FOR THE NURSE SELECT OPTION 2, PLEASE LEAVE A MESSAGE INCLUDING: YOUR NAME DATE OF BIRTH CALL BACK NUMBER REASON  FOR CALL**this is important as we prioritize the call backs  YOU WILL RECEIVE A CALL BACK THE SAME DAY AS LONG AS YOU CALL BEFORE 4:00 PM

## 2024-01-10 NOTE — Progress Notes (Signed)
 ADVANCED HF CLINIC NOTE  Primary Care: Joshua Debby CROME, MD Primary Cardiologist: Dr. Orlin ([facility]) HF Cardiologist: Dr. Cherrie  HPI: Kathleen [name] is a 78 y.o. 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 Recently moved from [place] to [facility] [place] and referred by Dr. Orlin to establish cardiology care.   Previous echo with EF 40% but EF recovered. Echo ([facility]) 8/22 EF > 55% RV normal   She has been followed by Dr. Rosine and Orlin at [facility]. Has maintained NSR on Tikosyn .   RHC + LHC (01/17/20): RA 12, PA mean 29, PCWP 13, CI 2.2, Coronary angiogram without any stenosis  RHC (02/08/20): RA 8, PAM 24, wedge 16, CI 2.5  Now followed by Dr. Kara in Pulmonary for her BOOP.    Established care in HF Clinic in 9/22.  Zio 11/22: Predominantly AF with v-pacing with periods of sinus rhythm with V-pacing - avg HR of 75 bpm. 2. 2. Rare PACs and PVCs.   Seen in AF clinic 3/23, continued on dofetilide  250 bid.  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 Interrogation 01/04/24 with increased thoracic impedence and Optivol consistent with volume overload with 7 lb/week weight gain.   She returns today for heart failure follow up. Overall feeling unwell. NYHA IIIb. Living in assisted living facility. Wearing portable O2. Has been having weight gain and poor response to diuretics over the last week. Developed a productive cough, has not had any fevers or chills. Reports dyspnea, fatigue, orthopnea, and cough. Denies chest pain, near-syncope, orthopnea, palpitations, dizziness, and abnormal bleeding. Appetite low at baseline. Compliant with all medications.  Cardiac Studies - Echo  (10/23) and (11/23) EF 35-40% (Dr. Cherrie felt 40-45%). Having episodes of CP that can wake her up from sleep. No exertional CP.  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     cyanocobalamin  (VITAMIN B12) 1000 MCG tablet Take 1,000 mcg by mouth daily.     docusate sodium  (COLACE) 100 MG capsule Take 100 mg by mouth daily.     dofetilide  (TIKOSYN ) 250 MCG capsule Take 1 capsule (250 mcg total) by mouth in the morning and at bedtime. 60 capsule 6  ELIQUIS  5 MG TABS tablet Take 1 tablet (5 mg total) by mouth 2 (two) times daily. 60 tablet 11   febuxostat  (ULORIC ) 40 MG tablet Take 1 tablet (40 mg total) by mouth daily. 90 tablet 1   fluticasone  furoate-vilanterol (BREO ELLIPTA ) 100-25 MCG/ACT AEPB INHALE 1 PUFF INTO THE LUNGS DAILY 60 each 11   gabapentin  (NEURONTIN ) 300 MG capsule Take 1 capsule (300 mg total) by mouth at bedtime. 90 capsule 3   ketoconazole  (NIZORAL ) 2 % cream Apply 1 Application topically 2 (two) times daily. 60 g 2   levalbuterol  (XOPENEX ) 0.63 MG/3ML nebulizer  solution Take 3 mLs (0.63 mg total) by nebulization every 6 (six) hours as needed for wheezing or shortness of breath. DX J44.89 J96.21 3 mL 12   Magnesium  500 MG TABS Take 500 mg by mouth every evening.     meclizine  (ANTIVERT ) 12.5 MG tablet Take 1 tablet (12.5 mg total) by mouth 3 (three) times daily as needed for dizziness. 30 tablet 0   mupirocin  ointment (BACTROBAN ) 2 % Apply 1 Application topically 2 (two) times daily. 30 g 3   naloxone  (NARCAN ) nasal spray 4 mg/0.1 mL Place 1 spray into the nose once as needed (opioid overdose).     oxyCODONE -acetaminophen  (PERCOCET/ROXICET) 5-325 MG tablet Take 1 tablet by mouth every 8 (eight) hours as needed for moderate pain (pain score 4-6).     OXYGEN  Inhale 4 L into the lungs continuous. Use with resmed ventilator     potassium chloride  (KLOR-CON ) 10 MEQ tablet Take 4 tablets (40 mEq total) by mouth daily. 360 tablet 2   revefenacin  (YUPELRI ) 175 MCG/3ML nebulizer solution Take 175 mcg by nebulization daily.     spironolactone  (ALDACTONE ) 25 MG tablet Take 12.5 mg by mouth every evening.     torsemide  (DEMADEX ) 20 MG tablet Take 1 tablet (20 mg total) by mouth daily. 90 tablet 2   triamcinolone  cream (KENALOG ) 0.1 % Apply 1 Application topically 2 (two) times daily. 45 g 0   phenazopyridine  (PYRIDIUM ) 200 MG tablet Take 1 tablet (200 mg total) by mouth 3 (three) times daily. 6 tablet 0   Zinc  Oxide (DESITIN MAXIMUM STRENGTH) 40 % PSTE Apply 1 Application topically 4 (four) times daily. Mix with aquaphor and apply to inflamed area     No current facility-administered medications for this encounter.   Allergies  Allergen Reactions   Allopurinol Other (See Comments)    Reaction:  Dizziness    Clindamycin Anaphylaxis and Hives   Flublok [Influenza Vaccine Recombinant] Other (See Comments)    Fever 103 with no alternative explanation day after vaccine.  Kathleen Highfill FNP-C   Pneumococcal 13-Val Conj Vacc Itching, Swelling and Rash   Dronedarone  Rash   Montelukast  Other (See Comments)    Makes her loopy.   Brovana  [Arformoterol ]     Caused muscle pain   Budesonide      Caused extreme joint pain   Entresto  [Sacubitril -Valsartan ] Other (See Comments)    hypotension   Fosamax [Alendronate Sodium] Nausea Only   Jardiance  [Empagliflozin ]     Caused a vaginal infection   Meperidine Nausea And Vomiting   Microplegia Msa-Msg [Plegisol]     Loopy,diarrhea   Rosuvastatin Other (See Comments)    Reaction:  Muscle spasms    Tetracycline Hives   Adhesive [Tape] Rash   Lovastatin Rash and Other (See Comments)    Muscle Pain   Social History   Socioeconomic History   Marital status: Widowed    Spouse  name: Not on file   Number of children: 1   Years of education: Not on file   Highest education level: Not on file  Occupational History   Occupation: retired  Tobacco Use   Smoking status: Former    Current packs/day: 0.00    Average packs/day: 1 pack/day for 20.0 years (20.0 ttl pk-yrs)    Types: Cigarettes    Start date: 06/09/1967    Quit date: 06/09/1987    Years since quitting: 36.6    Passive exposure: Past   Smokeless tobacco: Never   Tobacco comments:    Former smoker 12/25/21  Vaping Use   Vaping status: Never Used  Substance and Sexual Activity   Alcohol use: No    Alcohol/week: 0.0 standard drinks of alcohol   Drug use: No   Sexual activity: Not Currently  Other Topics Concern   Not on file  Social History Narrative   Pt lives in [place] alone.  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  Her son works in [employer].   Social Drivers of Corporate investment banker Strain: Low Risk  (09/15/2023)   Overall Financial Resource Strain (CARDIA)    Difficulty of Paying Living Expenses: Not hard at all  Food Insecurity: No Food Insecurity (12/01/2023)   Hunger Vital Sign    Worried About Running Out of Food in the Last Year: Never true    Ran Out of Food in the Last Year: Never true  Transportation Needs:  No Transportation Needs (12/01/2023)   PRAPARE - Administrator, Civil Service (Medical): No    Lack of Transportation (Non-Medical): No  Physical Activity: Insufficiently Active (09/15/2023)   Exercise Vital Sign    Days of Exercise per Week: 2 days    Minutes of Exercise per Session: 60 min  Stress: Stress Concern Present (09/15/2023)   [employer] of Occupational Health - Occupational Stress Questionnaire    Feeling of Stress : To some extent  Social Connections: Socially Isolated (11/26/2023)   Social Connection and Isolation Panel    Frequency of Communication with Friends and Family: More than three times a week    Frequency of Social Gatherings with Friends and Family: Once a week    Attends Religious Services: Never    Database administrator or Organizations: No    Attends Banker Meetings: Never    Marital Status: Widowed  Intimate Partner Violence: Not At Risk (12/01/2023)   Humiliation, Afraid, Rape, and Kick questionnaire    Fear of Current or Ex-Partner: No    Emotionally Abused: No    Physically Abused: No    Sexually Abused: No   Family History  Problem Relation Age of Onset   Breast cancer Mother 35       3 different times   Pancreatic cancer Mother    Emphysema Father    Healthy Brother    Breast cancer Cousin    Valvular heart disease Son    Obesity Daughter    Colon cancer Neg Hx    Esophageal cancer Neg Hx    Stomach cancer Neg Hx    Inflammatory bowel disease Neg Hx    Liver disease Neg Hx    Rectal cancer Neg Hx    BP 136/84   Pulse 73   Ht 5' (1.524 m)   Wt 87.1 kg (192 lb)   SpO2 98%   BMI 37.50 kg/m   Wt Readings from Last 3  Encounters:  01/10/24 87.1 kg (192 lb)  01/03/24 84.4 kg (186 lb)  01/02/24 84.8 kg (186 lb 15.2 oz)   PHYSICAL EXAM: General: Elderly appearing. No distress on [place].  Cardiac: JVP ~10cm. S1 and S2 present.  Abdomen: Soft, non-tender, non-distended. + hernia Extremities: Warm and dry.  Trace  BLE edema.  Neuro: Alert and oriented x3. Affect pleasant.   MDT Device interrogation (personally reviewed): 0.6 h/d activity, 0 AT/AF, 100% VP  ECG (personally reviewed): AV paced 71 bpm, Qtc 420 ms  ASSESSMENT & PLAN:   1. 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 RV ok  - Echo (10/23 to 4/24) EF 35-40%.  - R/L cath (10/23):  minimal CAD.  - Most recent echo 6/25: EF 45-50%, RV nl, mild MR - Chronic NYHA IIIB which is multifactorial. Volume significantly elevated by symptoms and Optivol. - increase Torsemide  40 mg daily, instructed to take bid for 1 day. BNP/BMET, repeat in 7-10 days.  - Discussed use of metolazone  dose to jump start diuresis, however she is unable to get to the pharmacy until her son returns from vacation in a week.  - will have iCM report sent next week.   - increase spiro to 25 mg daily. - no ? blocker with severe lung disease. - no SGLT2i with frequent UTIs. - Last Cardiomems reading 10/23 but pillow is not working. Will work on getting her a new pillow  2. 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 Kara) - Followed by Dr. Jude - Continue O2. No change  3. Persistent AF - s/p AVN ablation and PM (failed upgrade to BiV) - Followed by EP/AF Clinic - Continue Tikosyn , QTC ok today 420 ms - Continue Eliquis  5 bid  4. Morbid obesity  - Body mass index is 37.5 kg/m. - weight down after surgery  5. OSA - Continue Bipap  6. H/o end ostomy in 11/24 - c/b colovaginal fistula  Follow up  in 3 week with APP (volume)  [place] Caydance Kuehnle, NP  1:46 PM

## 2024-01-11 ENCOUNTER — Other Ambulatory Visit: Payer: Self-pay

## 2024-01-11 ENCOUNTER — Emergency Department (HOSPITAL_COMMUNITY)

## 2024-01-11 ENCOUNTER — Ambulatory Visit: Admitting: Sports Medicine

## 2024-01-11 ENCOUNTER — Encounter (HOSPITAL_COMMUNITY): Payer: Self-pay | Admitting: Emergency Medicine

## 2024-01-11 ENCOUNTER — Emergency Department (HOSPITAL_COMMUNITY)
Admission: EM | Admit: 2024-01-11 | Discharge: 2024-01-11 | Disposition: A | Discharge status: Home / Self Care | Attending: Emergency Medicine | Admitting: Emergency Medicine

## 2024-01-11 DIAGNOSIS — R21 Rash and other nonspecific skin eruption: Secondary | ICD-10-CM | POA: Insufficient documentation

## 2024-01-11 DIAGNOSIS — I509 Heart failure, unspecified: Secondary | ICD-10-CM | POA: Diagnosis not present

## 2024-01-11 DIAGNOSIS — B372 Candidiasis of skin and nail: Secondary | ICD-10-CM | POA: Diagnosis not present

## 2024-01-11 DIAGNOSIS — R600 Localized edema: Secondary | ICD-10-CM | POA: Diagnosis not present

## 2024-01-11 DIAGNOSIS — R531 Weakness: Secondary | ICD-10-CM

## 2024-01-11 DIAGNOSIS — J449 Chronic obstructive pulmonary disease, unspecified: Secondary | ICD-10-CM | POA: Insufficient documentation

## 2024-01-11 DIAGNOSIS — Z7951 Long term (current) use of inhaled steroids: Secondary | ICD-10-CM | POA: Diagnosis not present

## 2024-01-11 DIAGNOSIS — Z7901 Long term (current) use of anticoagulants: Secondary | ICD-10-CM | POA: Insufficient documentation

## 2024-01-11 LAB — CBC
HCT: 40.6 % (ref 36.0–46.0)
Hemoglobin: 12.4 g/dL (ref 12.0–15.0)
MCH: 26.2 pg (ref 26.0–34.0)
MCHC: 30.5 g/dL (ref 30.0–36.0)
MCV: 85.7 fL (ref 80.0–100.0)
Platelets: 218 K/uL (ref 150–400)
RBC: 4.74 MIL/uL (ref 3.87–5.11)
RDW: 15 % (ref 11.5–15.5)
WBC: 10.7 K/uL — ABNORMAL HIGH (ref 4.0–10.5)
nRBC: 0 % (ref 0.0–0.2)

## 2024-01-11 LAB — URINALYSIS, ROUTINE W REFLEX MICROSCOPIC
Bilirubin Urine: NEGATIVE
Glucose, UA: NEGATIVE mg/dL
Ketones, ur: NEGATIVE mg/dL
Nitrite: NEGATIVE
Protein, ur: NEGATIVE mg/dL
Specific Gravity, Urine: 1.005 (ref 1.005–1.030)
WBC, UA: >50 WBC/hpf (ref 0–5)
pH: 5 (ref 5.0–8.0)

## 2024-01-11 LAB — COMPREHENSIVE METABOLIC PANEL WITH GFR
ALT: 10 U/L (ref 0–44)
AST: 14 U/L — ABNORMAL LOW (ref 15–41)
Albumin: 3.7 g/dL (ref 3.5–5.0)
Alkaline Phosphatase: 51 U/L (ref 38–126)
Anion gap: 10 (ref 5–15)
BUN: 17 mg/dL (ref 8–23)
CO2: 27 mmol/L (ref 22–32)
Calcium: 10.1 mg/dL (ref 8.9–10.3)
Chloride: 102 mmol/L (ref 98–111)
Creatinine, Ser: 0.66 mg/dL (ref 0.44–1.00)
GFR, Estimated: >60 mL/min (ref >60)
Glucose, Bld: 114 mg/dL — ABNORMAL HIGH (ref 70–99)
Potassium: 3.7 mmol/L (ref 3.5–5.1)
Sodium: 139 mmol/L (ref 135–145)
Total Bilirubin: 0.5 mg/dL (ref 0.0–1.2)
Total Protein: 6.9 g/dL (ref 6.5–8.1)

## 2024-01-11 LAB — BRAIN NATRIURETIC PEPTIDE: B Natriuretic Peptide: 96.9 pg/mL (ref 0.0–100.0)

## 2024-01-11 LAB — TROPONIN I (HIGH SENSITIVITY): Troponin I (High Sensitivity): 5 ng/L (ref <18)

## 2024-01-11 NOTE — ED Notes (Signed)
 Pt O2 tank changed out. Pt is requesting to see a provider, stating her rash is becoming more painful.

## 2024-01-11 NOTE — ED Provider Notes (Signed)
 [place] EMERGENCY DEPARTMENT AT [facility] Provider Note   CSN: [id number] Arrival date & time: 01/11/24  1320     Patient presents with: Weakness and Rash   Kathleen [name] is a 77 y.o. female.   Patient here with shortness of breath.  She just had her Lasix /torsemide  increased by her cardiologist yesterday.  She is on 4 L of oxygen  chronically.  She denies any cough sputum production.  She feels like her oxygen  concentrator is not working very well.  She has been on some antifungal antibiotic cream for yeast infection in her left breast fold.  Overall she wanted her rash reevaluated.  She wanted to have her lungs evaluated.  She denies any chest pain.  She denies any new issues otherwise.  No fever or chills.  The history is provided by the patient.       Prior to Admission medications   Medication Sig Start Date End Date Taking? Authorizing Provider  Cholecalciferol  (VITAMIN D3) 50 MCG (2000 UT) TABS Take 2,000 Units by mouth at bedtime.    [provider]  cyanocobalamin  (VITAMIN B12) 1000 MCG tablet Take 1,000 mcg by mouth daily.    [provider]  docusate sodium  (COLACE) 100 MG capsule Take 100 mg by mouth daily.    [provider]  dofetilide  (TIKOSYN ) 250 MCG capsule Take 1 capsule (250 mcg total) by mouth in the morning and at bedtime. 07/23/23 07/22/24  Rolan Ezra RAMAN, MD  ELIQUIS  5 MG TABS tablet Take 1 tablet (5 mg total) by mouth 2 (two) times daily. 09/23/23   Bensimhon, Toribio SAUNDERS, MD  febuxostat  (ULORIC ) 40 MG tablet Take 1 tablet (40 mg total) by mouth daily. 10/06/23   Rice, Lonni ORN, MD  fluticasone  furoate-vilanterol (BREO ELLIPTA ) 100-25 MCG/ACT AEPB INHALE 1 PUFF INTO THE LUNGS DAILY 03/23/23   Jude Harden GAILS, MD  gabapentin  (NEURONTIN ) 300 MG capsule Take 1 capsule (300 mg total) by mouth at bedtime. 09/23/23 09/17/24  Gregg Lek, MD  ketoconazole  (NIZORAL ) 2 % cream Apply 1 Application topically 2 (two) times daily.  10/21/23   Joshua Debby CROME, MD  levalbuterol  (XOPENEX ) 0.63 MG/3ML nebulizer solution Take 3 mLs (0.63 mg total) by nebulization every 6 (six) hours as needed for wheezing or shortness of breath. DX J44.89 J96.21 01/20/23   Jude Harden GAILS, MD  Magnesium  500 MG TABS Take 500 mg by mouth every evening.    [provider]  meclizine  (ANTIVERT ) 12.5 MG tablet Take 1 tablet (12.5 mg total) by mouth 3 (three) times daily as needed for dizziness. 12/06/23   Alvia Corean CROME, FNP  mupirocin  ointment (BACTROBAN ) 2 % Apply 1 Application topically 2 (two) times daily. 10/22/23   Joshua Debby CROME, MD  naloxone  (NARCAN ) nasal spray 4 mg/0.1 mL Place 1 spray into the nose once as needed (opioid overdose). 11/12/23   [provider]  oxyCODONE -acetaminophen  (PERCOCET/ROXICET) 5-325 MG tablet Take 1 tablet by mouth every 8 (eight) hours as needed for moderate pain (pain score 4-6). 11/16/23   [provider]  OXYGEN  Inhale 4 L into the lungs continuous. Use with resmed ventilator    [provider]  potassium chloride  (KLOR-CON ) 10 MEQ tablet Take 4 tablets (40 mEq total) by mouth daily. 01/05/24 04/04/24  Glena Harlene HERO, FNP  revefenacin  (YUPELRI ) 175 MCG/3ML nebulizer solution Take 175 mcg by nebulization daily.    [provider]  spironolactone  (ALDACTONE ) 25 MG tablet Take 1 tablet (25 mg total) by  mouth every evening. 01/10/24   Lee, [place], NP  torsemide  (DEMADEX ) 20 MG tablet Take 2 tablets (40 mg total) by mouth daily. 01/10/24   Lee, [place], NP  triamcinolone  cream (KENALOG ) 0.1 % Apply 1 Application topically 2 (two) times daily. 07/02/23   Cyndi Shaver, PA-C    Allergies: Allopurinol, Clindamycin, Flublok [influenza vaccine recombinant], Pneumococcal 13-val conj vacc, Dronedarone, Montelukast , Brovana  [arformoterol ], Budesonide , Entresto  [sacubitril -valsartan ], Fosamax [alendronate sodium], Jardiance  [empagliflozin ], Meperidine, Microplegia msa-msg [plegisol],  Rosuvastatin, Tetracycline, Adhesive [tape], and Lovastatin    Review of Systems  Updated Vital Signs BP 106/68 (BP Location: Right Wrist)   Pulse 74   Temp 97.8 F (36.6 C)   Resp 17   Ht 5' (1.524 m)   Wt 87.1 kg   SpO2 100%   BMI 37.50 kg/m   Physical Exam Vitals and nursing note reviewed.  Constitutional:      General: She is not in acute distress.    Appearance: She is well-developed. She is not ill-appearing.  HENT:     Head: Normocephalic and atraumatic.     Nose: Nose normal.     Mouth/Throat:     Mouth: Mucous membranes are moist.  Eyes:     Extraocular Movements: Extraocular movements intact.     Conjunctiva/sclera: Conjunctivae normal.     Pupils: Pupils are equal, round, and reactive to light.  Cardiovascular:     Rate and Rhythm: Normal rate and regular rhythm.     Pulses: Normal pulses.     Heart sounds: Normal heart sounds. No murmur heard. Pulmonary:     Effort: Pulmonary effort is normal. No respiratory distress.     Breath sounds: Normal breath sounds.  Abdominal:     General: Abdomen is flat.     Palpations: Abdomen is soft.     Tenderness: There is no abdominal tenderness.  Musculoskeletal:        General: No swelling.     Cervical back: Normal range of motion and neck supple.     Right lower leg: Edema present.     Left lower leg: Edema present.     Comments: Trace edema in her legs  Skin:    General: Skin is warm and dry.     Capillary Refill: Capillary refill takes less than 2 seconds.     Comments: She has a very mild fungal rash in the left breast fold that has good central clearing  Neurological:     General: No focal deficit present.     Mental Status: She is alert and oriented to person, place, and time.     Cranial Nerves: No cranial nerve deficit.     Sensory: No sensory deficit.     Motor: No weakness.     Coordination: Coordination normal.  Psychiatric:        Mood and Affect: Mood normal.     (all labs ordered are listed,  but only abnormal results are displayed) Labs Reviewed  COMPREHENSIVE METABOLIC PANEL WITH GFR - Abnormal; Notable for the following components:      Result Value   Glucose, Bld 114 (*)    AST 14 (*)    All other components within normal limits  CBC - Abnormal; Notable for the following components:   WBC 10.7 (*)    All other components within normal limits  URINALYSIS, ROUTINE W REFLEX MICROSCOPIC - Abnormal; Notable for the following components:   Color, Urine STRAW (*)    APPearance HAZY (*)    Hgb  urine dipstick SMALL (*)    Leukocytes,Ua LARGE (*)    Bacteria, UA RARE (*)    All other components within normal limits  BRAIN NATRIURETIC PEPTIDE  TROPONIN I (HIGH SENSITIVITY)    EKG: EKG Interpretation Date/Time:  Tuesday January 11 2024 13:31:37 EDT Ventricular Rate:  70 PR Interval:  180 QRS Duration:  154 QT Interval:  468 QTC Calculation: 505 R Axis:   -72  Text Interpretation: AV dual-paced rhythm Abnormal ECG When compared with ECG of 10-Jan-2024 14:29, PREVIOUS ECG IS PRESENT Confirmed by Ruthe Cornet [phone]) on 01/11/2024 4:21:19 PM  Radiology: DG Chest Portable 1 View Result Date: 01/11/2024 EXAM: 1 VIEW XRAY OF THE CHEST 01/11/2024 04:47:05 PM COMPARISON: None available. CLINICAL HISTORY: SOB. Per [facility] pt coming from [facility] c/o weakness and dizziness since 8 am when waking. Pt c/o an ongoing productive cough and also being treated for rash to left breast that she was prescribed a cream for. FINDINGS: LUNGS AND PLEURA: No focal pulmonary opacity. No pulmonary edema. No pleural effusion. No pneumothorax. 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 Cardiomegaly, unchanged. Cardiomems ( pulmonary intra-arterial pressure monitor ). device in the left hilum. 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 Osteopenia. Multilevel thoracic osteophytosis. IMPRESSION: 1. No acute intrathoracic abnormality. Electronically signed by: Rogelia Myers MD 01/11/2024 05:17 PM EDT RP Workstation: [id number]     Procedures   Medications Ordered in the ED - No data to display                                  Medical Decision Making Amount and/or Complexity of Data Reviewed Labs: ordered. Radiology: ordered.   Kathleen [name] is here for evaluation of shortness of breath and rash.  History of COPD CHF A-fib chronically on 4 L of oxygen .  On Eliquis .  Increased torsemide  yesterday.  Her main concern is for evaluation of her rash of her left breast which looks fungal in appearance and overall looks very well.  She has been on antifungal and Bactroban  for the last 2 weeks and ultimately looks very mild I suspect this looks greatly improved from prior.  She is a little bit nervous about her oxygen  concentrator at home but sounds like after talking with case management she has several oxygen  options at home.  She will have folks come out to the house tomorrow to evaluate her main concentrator otherwise.  Ultimately will look to see if she has any pneumonia heart failure ACS but she looks very comfortable.  I do not see any signs of respiratory distress or major signs of volume overload.  Will check CBC BMP troponin BNP chest x-ray.  EKG shows paced rhythm.  No ischemic changes.  She is on anticoagulation and doubt PE.  I do not hear any wheezing on exam.  Per my review and interpretation of labs there is no significant leukocytosis anemia or electrolyte abnormality.  Chest x-ray unremarkable.  No signs of pneumonia or volume overload.  Troponin BNP normal.  Overall patient with no acute findings.  Seems like we have figured out that she should have multiple options to make sure her oxygen  is working well.  Overall given her reassurance about  her rash.  Continue her  antifungal cream and Bactroban .  Follow-up with primary care.  Discharge.  This chart was dictated using voice recognition software.  Despite best efforts to proofread,  errors can occur which can change the documentation meaning.      Final diagnoses:  Rash  Generalized weakness    ED Discharge Orders     None          Ruthe Cornet, DO 01/11/24 1924

## 2024-01-11 NOTE — ED Notes (Signed)
 [facility] called for transport. 2nd on list

## 2024-01-11 NOTE — ED Triage Notes (Signed)
 Per [facility] pt coming from [facility] independent [facility] c/o weakness and dizziness since 8 am when waking. Seen by cards yesterday for fluid overload and increased her lasix  to 80 mg. Also being treated for rash to left breast that she was prescribed a cream for. On 4L [place] at baseline. C/o chronic right hip pain that she was supposed to see ortho later today.

## 2024-01-11 NOTE — ED Notes (Signed)
 PT D/C'D AFTER INSTRUCTIONS REVIEWED. PT VERBALIZED UNDERSTANDING. NAD REPORTED OR NOTED AT THIS TIME.

## 2024-01-11 NOTE — Discharge Instructions (Signed)
 Continue your current management for your rash.  Follow-up with your primary care doctor

## 2024-01-13 NOTE — Progress Notes (Signed)
 Office Visit Note  Patient: Kathleen [name]             Date of Birth: 1945-10-01           MRN: [id number]             PCP: Joshua Debby CROME, MD Referring: Joshua Debby CROME, MD Visit Date: 01/25/2024   Subjective:  Follow-up (Patient is having come concerns with her gabapentin , states at night the left heel feels raw an hurts really bad .  )   History of Present Illness:  Discussed the use of AI scribe software for clinical note transcription with the patient, who gave verbal consent to proceed.  History of Present Illness   Kathleen [name] is a 78 y.o. female here for follow up  for chronic gout on Uloric  40 mg daily.    There have been no recent gout flare-ups, and she notes that the medication has been effective. Her last uric acid level was 8.6, checked approximately a year ago.  She experiences heel pain in her left foot, described as feeling 'thin' and particularly bothersome at night, waking her up. The pain is absent in the morning when she gets out of bed. It is localized to the bottom of the heel and does not affect the right foot. She is concerned that gabapentin  might be contributing to the symptoms, although she questions why it would only affect one foot.  She is currently taking gabapentin  300 mg at night for neuropathy, which she feels has not been effective in alleviating her symptoms. She describes the back part of her heel as feeling 'thin' and the front part of her feet as if she is wearing thick socks.  She mentions having been hospitalized for 66 days, which has impacted her ability to exercise and may have contributed to her current physical condition. She is not walking much and is experiencing tightness in her calf muscles, particularly on the lateral side, which may be affecting her heel pain.   Previous HPI 10/06/2023 Kathleen [name] is a 78 y.o. female here for follow up for chronic gout on Uloric  40 mg daily.  She is also suffering from bilateral carpal  tunnel syndrome and osteoarthritis who presents with worsening pain and functional limitations.   She experiences persistent and worsening pain in her right hip and both shoulders, describing the shoulder pain as 'bone on bone'. There are significant limitations in her range of motion, particularly in her ability to lift her arms. She has difficulty performing daily activities such as lifting heavy dishes and washing her hair. The pain is severe enough to disrupt her sleep, and turning over in bed is particularly painful.  Unfortunately she had complicated medical events with her hospitalization and emergent abdominal surgeries leading to a greater than 1 month long hospitalization.  She just had recent injections for bilateral SI joint pain but feels this is very helpful in the left not yet helping much on the right side.   She has a history of gout but reports no recent flare-ups. She has been taking uloric  for gout management. Her uric acid levels were slightly elevated in December, but she has not experienced any gout attacks since her emergency colostomy surgery in November.   She has bilateral carpal tunnel syndrome, confirmed by a nerve conduction study. She experiences significant hand weakness and frequent dropping of objects, particularly with her left hand. She describes her hand as 'jerking' and notes atrophy in her hand  muscles. She has been taking oxycodone  5 mg at night for pain, but finds it inconsistently effective. She reports numbness in her fingers and attributes her carpal tunnel syndrome to prolonged computer use during her career.   She also mentions a history of osteoarthritis, which she believes affects multiple joints. She experiences significant pain in her hips and shoulders, which limits her mobility and ability to perform daily tasks. She has been using her left arm more due to limitations, which has led to increased pain and dysfunction in her shoulder.   Her social history  includes a past occupation involving extensive computer use, which she believes contributed to her carpal tunnel syndrome. She enjoys painting but has been unable to engage in this activity due to her hand and shoulder limitations.         Previous HPI 01/27/2023 Kathleen [name] is a 78 y.o. female here for follow up for chronic gout on Uloric  40 mg daily.  Gout is doing well overall with no new definite flareup since her last visit has been only 1 in the past year.  Unfortunately has had ongoing joint pain at multiple areas.  Especially in her neck and back and shoulders.  Seeing Dr. Burnetta for this had steroid injection at the right subacromial joint which was beneficial.  Also started formal physical therapy for deficits due to rotator cuff tear and has improved her extension and abduction range of motion to slightly better than horizontal.  Has lot of trigger point and muscle knot problems in the upper back these are sometimes worse after doing shoulder exercises. Recent medical events with exacerbation of COPD leading to a hospitalization earlier this month.  Before that she was sick with worsening respiratory symptoms from a pneumonia that did not resolve for several weeks.  X-ray from July 15 did not show evidence of pneumonia.  X-ray at hospital admission on August 7 also with clear airspace but with new pleural effusion.  She was not seeing any visible increase in lower extremity swelling.  Lab testing on discharge showed decrease in total protein level. Also has ongoing trouble with intermittent numbness and dropping items unintentionally more in the right hand.  Reports previous history of carpal tunnel syndrome but reasonably well-controlled based on previous orthopedics note.  Never had any local intervention on this. Also has pain in her left foot currently after she struck her third toe on a counter corner walking at home.  There was extensive bruising and swelling immediately afterwards but  this has mostly improved after 4 weeks.  She initially treated with buddy taping the second and third toe together. Since then just wearing some shoes with a looser toe box.   Previous HPI 07/23/2022 CHELSEI MCCHESNEY is a 78 y.o. female here for follow up for chronic gout on Uloric  40 mg daily.  She has not had any new flareups since her last visit.  Recently had some additional adjustments to her diuretic medication due to increase in volume overload and oxygen  requirement.  For about the past 3 weeks is also having increase in left shoulder pain with decreased mobility.  This is limiting her quite a bit since the right shoulder has little use due to full-thickness tendon tears for which she is not a surgical candidate to repair due to severe cardiopulmonary disease.  Less severe is having pain in bilateral hips this frequently bothers her lying in bed at night or if she is seated in fixed position for more than  about half an hour.   Previous HPI 01/19/22 PEARLE WANDLER is a 78 y.o. female here for evaluation and management of chronic gout.  She has had gouty arthritis ongoing for a few years now most often affecting the right foot.  She saw a rheumatology provider in [place] for management more recently just being treated with her primary care office.  She has previously taken allopurinol stopped due to concern about allergic reaction.  She was on maintenance colchicine  for flare prevention but had stopped this due to lack of efficacy.  More recently was prescribed colchicine  during severe flareup of pain in her left hand with no obvious clinical response.  She was treated with probenecid apparently stopped due to poor clinical response, not sure if this was uric acid or other parameters.  More recently appears to have been treated with uloric  since 2020 but with increased frequency and duration of gout flares in the past year. She was admitted to the hospital in December apparently with sepsis secondary  to some chest wall cellulitis.  During his hospital stay she developed right and then the left-sided foot swelling and severe pain.  She also had a recurrence of the right foot symptoms within a few days after hospital discharge.  This was treated with prednisone  with clinical improvement.  Symptoms remained okay with her feet but in June developed sudden left fourth PIP joint pain swelling and erythema.  This was thought to represent a new gout flare and treated with oral colchicine .  Symptoms are ongoing for about a month before resolving on their own she did not see any difference when taking the medicine.  She has been taking her subluxes at 40 mg daily consistently throughout this time.  Right now joints are pretty much all back to baseline.   Review of Systems  Constitutional:  Positive for fatigue.  HENT:  Positive for mouth dryness. Negative for mouth sores.   Eyes:  Negative for dryness.  Respiratory:  Positive for shortness of breath.   Cardiovascular:  Positive for chest pain. Negative for palpitations.  Gastrointestinal:  Negative for blood in stool, constipation and diarrhea.  Endocrine: Positive for increased urination.  Genitourinary:  Negative for involuntary urination.  Musculoskeletal:  Positive for joint pain, joint pain, myalgias, muscle tenderness and myalgias. Negative for gait problem, joint swelling, muscle weakness and morning stiffness.  Skin:  Negative for color change, rash, hair loss and sensitivity to sunlight.  Allergic/Immunologic: Negative for susceptible to infections.  Neurological:  Positive for dizziness. Negative for headaches.  Hematological:  Negative for swollen glands.  Psychiatric/Behavioral:  Positive for depressed mood. Negative for sleep disturbance. The patient is not nervous/anxious.     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   History of diverticulitis 11/26/2023   Tinea corporis 10/21/2023   Surgical wound, non healing 09/10/2023   Colostomy complication (HCC) 08/18/2023   Irritant contact dermatitis associated with fecal stoma 08/13/2023   Colostomy care (HCC) 08/13/2023   Wound infection 08/04/2023   Dyslipidemia 08/03/2023   Candida esophagitis (HCC) 05/24/2023   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   Pill dysphagia 04/06/2023   COPD exacerbation (HCC) 03/26/2023   Absolute anemia 03/17/2023   Chronic nausea 03/17/2023   Hypokalemia 02/23/2023   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   Carpal tunnel syndrome, right upper limb 01/27/2023   Hypercoagulable state (HCC) 11/18/2022   COPD with acute exacerbation (HCC) 10/09/2022   Chronic obstructive pulmonary disease (HCC) 06/28/2022   Palliative care encounter 06/28/2022   Anticoagulated by anticoagulation treatment 06/11/2022   Supplemental oxygen  dependent 06/11/2022   Heart failure with reduced ejection fraction (HCC) 05/14/2022   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   Depression, major, single episode, moderate (HCC) 04/27/2022   Obesity (BMI 30-39.9) 03/18/2022   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   Low TSH level 12/02/2021   Acquired hypothyroidism 11/12/2021   Encounter for palliative care involving management of pain 11/12/2021   Chronic left-sided low back pain 11/04/2021   Gout 10/01/2021   Hyperlipidemia with target LDL less than 100 08/07/2021   Chronic anticoagulation 07/07/2021   Stage 3a chronic kidney disease (HCC) 05/23/2021   Dietary iron deficiency 05/23/2021   Age-related osteoporosis without  current pathological fracture 04/22/2021   Type 2 diabetes mellitus with diabetic neuropathy, without long-term current use of insulin  (HCC) 04/22/2021   Sleep apnea 01/22/2021   Physical deconditioning 01/16/2020   Statin intolerance 10/12/2019   Statin myopathy 02/24/2019   Chronic combined systolic and diastolic heart failure (HCC) 04/06/2018   HTN (hypertension) 04/06/2018   COPD (chronic obstructive pulmonary disease) (HCC) 04/06/2018   Persistent atrial fibrillation (HCC) 04/06/2018   Chronic respiratory failure with hypoxia (HCC) 03/28/2018   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 12/02/2015   Protein-calorie malnutrition, moderate (HCC) 09/11/2015    Past Medical History:  Diagnosis Date   Arthritis    Asthma    Blood transfusion without reported diagnosis 2015   in hospital   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HCC)    Cataract 2016   very small   CHF (congestive heart failure) (HCC)    Chronic renal insufficiency    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    Concussion 10/04/2021   COPD (chronic obstructive pulmonary disease) (HCC)    Diverticulitis    Emphysema of lung (HCC) 2013   lung collapsed   Gout    Hypertension    Longstanding persistent atrial fibrillation (HCC)    on Xarelto    Nonischemic cardiomyopathy (HCC)    Obesity    Oxygen  deficiency 2013   Pacemaker    Spontaneous pneumothorax 2013   Thyroid  disease 1990    Family History  Problem Relation Age of Onset   Breast cancer Mother 16       3 different times   Pancreatic cancer Mother    Emphysema Father    Healthy Brother    Breast cancer Cousin    Valvular heart disease Son    Obesity Daughter    Colon cancer Neg Hx    Esophageal cancer Neg Hx    Stomach cancer Neg Hx    Inflammatory bowel disease Neg Hx    Liver disease Neg Hx    Rectal cancer Neg Hx    Past Surgical History:  Procedure Laterality Date   ABDOMINAL HYSTERECTOMY      APPENDECTOMY     APPLICATION OF WOUND VAC  04/27/2023   Procedure: APPLICATION OF WOUND VAC;  Surgeon: Ebbie Cough, MD;  Location: [facility] OR;  Service: General;;   ATRIAL FIBRILLATION ABLATION  07/20/2013   by  Dr Pierrette   AV nodal ablation  11/01/2013   by Dr Pierrette, repeated by Dr Rosine   BREAST BIOPSY Bilateral 1997   negative   CARDIAC CATHETERIZATION     CESAREAN SECTION  1976   CHOLECYSTECTOMY     COLON RESECTION SIGMOID N/A 04/27/2023   Procedure: COLON RESECTION SIGMOID WITH COLOSTOMY;  Surgeon: Ebbie Cough, MD;  Location: [facility] OR;  Service: General;  Laterality: N/A;   COLON SURGERY  2024   ESOPHAGOGASTRODUODENOSCOPY (EGD) WITH PROPOFOL  N/A 05/24/2023   Procedure: ESOPHAGOGASTRODUODENOSCOPY (EGD) WITH PROPOFOL ;  Surgeon: Albertus Gordy HERO, MD;  Location[facility] ENDOSCOPY;  Service: Gastroenterology;  Laterality: N/A;   HERNIA REPAIR     LAPAROTOMY N/A 04/27/2023   Procedure: EXPLORATORY LAPAROTOMY;  Surgeon: Ebbie Cough, MD;  Location: [facility] OR;  Service: General;  Laterality: N/A;   PACEMAKER INSERTION  06/2017   MDT Viva CRT-P implanted by Dr Pierrette after AV nodal ablation,  LV lead could not be placed   PACEMAKER INSERTION  05/2020   with lead bundle   RIGHT/LEFT HEART CATH AND CORONARY ANGIOGRAPHY N/A 03/20/2022   Procedure: RIGHT/LEFT HEART CATH AND CORONARY ANGIOGRAPHY;  Surgeon: Swazil[place], Peter M, MD;  Location[facility] INVASIVE CV LAB;  Service: Cardiovascular;  Laterality: N/A;   TUBAL LIGATION  1976   Social History   Social History Narrative   Pt lives in [place] alone.  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  Her son works in [employer].   Immunization History  Administered Date(s) Administered   Fluad Quad(high Dose 65+) 04/22/2021, 03/16/2022   Hepatitis A, Adult 04/12/2001   INFLUENZA, HIGH DOSE SEASONAL PF 03/16/2019   PFIZER Comirnaty(Gray Top)Covid-19 Tri-Sucrose Vaccine 03/29/2020, 10/09/2020   PFIZER(Purple Top)SARS-COV-2  Vaccination 07/13/2019, 08/08/2019   Pneumococcal Conjugate-13 08/30/2014   Pneumococcal Polysaccharide-23 03/31/2019   Tdap 10/02/2013   Yellow Fever 04/12/2001     Objective: Vital Signs: BP (!) 102/54 (BP Location: Right Arm, Patient Position: Sitting, Cuff Size: Normal) Comment (BP Location): lower arm  Pulse 70   Resp 19   Ht 5' (1.524 m)   Wt 188 lb 11.2 oz (85.6 kg)   BMI 36.85 kg/m    Physical Exam Constitutional:      Appearance: She is obese.  Eyes:     Conjunctiva/sclera: Conjunctivae normal.  Cardiovascular:     Rate and Rhythm: Normal rate and regular rhythm.  Pulmonary:     Effort: Pulmonary effort is normal.     Breath sounds: Normal breath sounds.  Abdominal:     Comments: Ostomy  Lymphadenopathy:     Cervical: No cervical adenopathy.  Skin:    General: Skin is warm.  Neurological:     Mental Status: She is alert.  Psychiatric:        Mood and Affect: Mood normal.      Musculoskeletal Exam:  Right shoulder abduction restricted and somewhat painful. left shoulder normal ROM Good wrist flexion and extension range of motion with tenderness to percussion Squaring of first CMC joint, Heberden's nodes, thenar muscle atrophy Tenderness to pressure on plantar surface of feet, without palpable swelling warmth or redness    Investigation: No additional findings.  Imaging: US  Guided Needle Placement - No Linked Charges Result Date: 01/27/2024 Ultrasound imaging demonstrates needle placement into the glenohumeral joint with injection of fluid into the joint and no complicating features. Left shoulder  DG Chest Port 1 View Result Date: 01/19/2024 CLINICAL DATA:  Cough. EXAM: PORTABLE CHEST 1 VIEW COMPARISON:  01/11/2024. FINDINGS: Stable cardiomegaly. Mediastinal contours are unchanged. Stable left-sided pacemaker. Overlapping telemetry wires. No focal consolidation, sizeable pleural effusion, or pneumothorax. Redemonstrated high-riding right humeral head  likely secondary to chronic rotator cuff pathology/tear with moderate-to-severe glenohumeral osteoarthritis. Moderate left glenohumeral osteoarthritis. No acute osseous abnormality. IMPRESSION: 1. No acute cardiopulmonary findings. 2. Stable cardiomegaly. Electronically Signed   By: Harrietta Sherry M.D.   On: 01/19/2024 14:09   DG Chest Portable 1 View Result Date: 01/11/2024 EXAM: 1 VIEW XRAY OF THE CHEST 01/11/2024 04:47:05 PM COMPARISON: None available. CLINICAL HISTORY: SOB. Per [facility] pt coming from [place] [facility] [place] [facility] c/o weakness and dizziness since 8 am when waking. Pt c/o an ongoing productive cough and also being treated for rash to left breast that she was prescribed a cream for. FINDINGS: LUNGS AND PLEURA: No focal pulmonary opacity. No pulmonary edema. No pleural effusion. No pneumothorax. 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 Cardiomegaly, unchanged. Cardiomems ( pulmonary intra-arterial pressure monitor ). device in the left hilum. BONES AND SOFT TISSUES: Superior subluxation of the right humerus with respect to the glenoid, likely reflecting chronic rotator cuff tear with superimposed glenohumeral joint osteoarthritis. Osteopenia. Multilevel thoracic osteophytosis. IMPRESSION: 1. No acute intrathoracic abnormality. Electronically signed by: Rogelia Myers MD 01/11/2024 05:17 PM EDT RP Workstation: [id number]    Recent Labs: Lab Results  Component Value Date   WBC 10.0 01/19/2024   HGB 11.8 (L) 01/19/2024   PLT 200 01/19/2024   NA 140 02/01/2024   K 3.7 02/01/2024   CL 102 02/01/2024   CO2 26 02/01/2024   GLUCOSE 107 (H) 02/01/2024   BUN 25 (H) 02/01/2024   CREATININE 0.78 02/01/2024   BILITOT 0.5 01/11/2024   ALKPHOS 51 01/11/2024   AST 14 (L) 01/11/2024   ALT 10 01/11/2024   PROT 6.9 01/11/2024   ALBUMIN  3.7 01/11/2024   CALCIUM  10.1 02/01/2024   GFRAA 58 (L) 06/28/2019     Speciality Comments: No specialty comments available.  Procedures:  No procedures performed Allergies: Allopurinol, Clindamycin, Flublok [influenza vaccine recombinant], Pneumococcal 13-val conj vacc, Dronedarone, Montelukast , Brovana  [arformoterol ], Budesonide , Entresto  [sacubitril -valsartan ], Fosamax [alendronate sodium], Jardiance  [empagliflozin ], Meperidine, Microplegia msa-msg [plegisol], Rosuvastatin, Tetracycline, Adhesive [tape], and Lovastatin   Assessment / Plan:     Visit Diagnoses: Acute idiopathic gout of left hand - 05/24/2023 Uric Acid 8.6 - Plan: Uric acid, febuxostat  (ULORIC ) 40 MG tablet No recent flares. Uric acid level previously 8.6, above target. Febuxostat  40 mg believed effective now. - Check uric acid level to evaluate febuxostat  efficacy with target of 6, can titrate daily dose as needed. Stage 3a chronic kidney disease (HCC)  Carpal tunnel syndrome, right upper limb Carpal tunnel syndrome of left wrist - S/P Hand/UE Inj: L carpal tunnel for carpal tunnel syndrome on 10/06/2023  Pain of left heel - Plan: XR Foot 2 Views Left Left foot plantar heel pain (possible plantar fasciitis) Pain likely due to plantar fasciitis, possibly worsened by calf and Achilles tightness. Gabapentin  not a contributing factor. - Left foot xray today- large plantar enthesophyte otherwise normal - Recommend plantar fasciitis exercises for foot and ankle stretching.  Polyneuropathy Gabapentin  300 mg at night for neuropathy. Symptoms persist despite dose increase.  Primary osteoarthritis of left shoulder Severe osteoarthritis limits motion. Previous injection ineffective.   Orders: Orders Placed This Encounter  Procedures   XR Foot 2 Views Left   Uric acid   Meds ordered this encounter  Medications  febuxostat  (ULORIC ) 40 MG tablet    Sig: Take 1 tablet (40 mg total) by mouth daily.    Dispense:  90 tablet    Refill:  1     Follow-Up Instructions: Return in about 6  months (around 07/27/2024) for Gout/OA/CTS f/u 6mos.   Lonni LELON Ester, MD  Note - This record has been created using [employer].  Chart creation errors have been sought, but may not always  have been located. Such creation errors do not reflect on  the standard of medical care.

## 2024-01-14 ENCOUNTER — Telehealth: Payer: Self-pay | Admitting: Internal Medicine

## 2024-01-14 NOTE — Telephone Encounter (Signed)
 yes

## 2024-01-14 NOTE — Telephone Encounter (Signed)
 Can I give the verbals ?

## 2024-01-14 NOTE — Telephone Encounter (Signed)
 Verbal Orders has been given.

## 2024-01-14 NOTE — Telephone Encounter (Signed)
 Copied from CRM [id number]. Topic: Clinical - Home Health Verbal Orders >> Jan 13, 2024  4:37 PM Delon DASEN wrote: Caller/Agency: Sherice with [facility] Callback Number: [phone] Service Requested: Physical Therapy Frequency: n/a Any new concerns about the patient? No- need verbal order to discharge

## 2024-01-17 ENCOUNTER — Ambulatory Visit: Attending: Cardiology

## 2024-01-17 DIAGNOSIS — I5022 Chronic systolic (congestive) heart failure: Secondary | ICD-10-CM

## 2024-01-17 DIAGNOSIS — Z950 Presence of cardiac pacemaker: Secondary | ICD-10-CM

## 2024-01-17 NOTE — Progress Notes (Signed)
 EPIC Encounter for ICM Monitoring  Patient Name: Kathleen [name] is a 78 y.o. female Date: 01/17/2024 Primary Care Physican: Kathleen Debby CROME, MD Primary Cardiologist: Kathleen [name] Electrophysiologist: Kathleen [name] Bi-V Pacing: 100%          04/06/2023 Home Weight: 176 lbs    06/30/2023 Home Weight: 178 lbs 08/02/2023 Home Weight: 172 lbs 09/15/2023   Home Weight: 177 lbs 11/11/2023   Home Weight: 176 lbs 11/17/2023 Home Weight: 182 lbs 11/22/2023 Home Weight: 185 lbs 01/05/2024 Home Weight: 190 lbs  01/17/2024 Home Weight: 183 lbs   Since 10-Jan-2024 Time in AT/AF  0.0 hr/day (0.0%)                                 Spoke with patient and heart failure questions reviewed.  Transmission results reviewed.  Pt reports weight has returned closer to baseline today since she has been taking Torsemide  40 mg daily.     Diet:  She lives in an assisted living  Eats foods that are prepared at the facility she lives in which are not low salt.     Optivol thoracic impedance suggesting fluid levels returned to normal 8/5 after Torsemide  increased to 40 mg daily.  Fluid index continues to be >normal threshold starting 6/11.   Prescribed:  Torsemide  20 mg take 2 tablet(s) (40 mg total) by mouth daily.    Potassium 10 mEq take 4 tablet(s) (40 mEq total) by mouth daily  Spironolactone  25 mg take 1 (25 mg total) tablet by mouth every evening   Labs: 01/21/2024 BMET Scheduled for 8/15 01/11/2024 Creatinine 0.66, BUN 17, Potassium 3.7, Sodium 139, GFR >60  01/10/2024 Creatinine 0.73, BUN 20, Potassium 4.3, Sodium 140, GFR >60  01/02/2024 Creatinine 0.77, BUN 17, Potassium 4.0, Sodium 137, GFR >60  11/29/2023 Creatinine 0.89, BUN 19, Potassium 3.6, Sodium 137, GFR >60  11/28/2023 Creatinine 0.66, BUN 15, Potassium 4.3, Sodium 135, GFR >60  11/27/2023 Creatinine 0.59, BUN 18, Potassium 3.6, Sodium 142, GFR >60  11/26/2023 Creatinine 0.63, BUN 19, Potassium 3.8, Sodium 139, GFR >60  09/17/2023 Creatinine 1.17, BUN 323,  Potassium 3.7, Sodium 139, GFR 48  A complete set of results can be found in Results Review.   Recommendations:  Pt often has confusion on how much Torsemide  she should be taking. Confirmed she is taking the 40 mg that Kathleen [name] at HF prescribed and should continue with this dosage unless HF clinic calls her.    Follow-up plan: ICM clinic phone appointment on 02/08/2024.  91 day device clinic remote transmission 03/29/2024.     EP/Cardiology Office Visits:  02/01/2024 with HF Clinic.  02/08/2024 Afib Clinic.   Copy of ICM check sent to Dr. Inocencio.   Copy sent to [name] [place], NP at [facility] clinic as requested after 8/4 OV.  3 month ICM trend: 01/17/2024.    12-14 Month ICM trend:     Kathleen GORMAN Garner, RN 01/17/2024 11:30 AM

## 2024-01-19 ENCOUNTER — Other Ambulatory Visit: Payer: Self-pay

## 2024-01-19 ENCOUNTER — Emergency Department (HOSPITAL_COMMUNITY)

## 2024-01-19 ENCOUNTER — Encounter (HOSPITAL_COMMUNITY): Payer: Self-pay

## 2024-01-19 ENCOUNTER — Emergency Department (HOSPITAL_COMMUNITY)
Admission: EM | Admit: 2024-01-19 | Discharge: 2024-01-19 | Disposition: A | Discharge status: Home / Self Care | Attending: Emergency Medicine | Admitting: Emergency Medicine

## 2024-01-19 DIAGNOSIS — Z7951 Long term (current) use of inhaled steroids: Secondary | ICD-10-CM | POA: Insufficient documentation

## 2024-01-19 DIAGNOSIS — I4891 Unspecified atrial fibrillation: Secondary | ICD-10-CM | POA: Insufficient documentation

## 2024-01-19 DIAGNOSIS — I509 Heart failure, unspecified: Secondary | ICD-10-CM | POA: Diagnosis not present

## 2024-01-19 DIAGNOSIS — Z9981 Dependence on supplemental oxygen: Secondary | ICD-10-CM | POA: Diagnosis not present

## 2024-01-19 DIAGNOSIS — J449 Chronic obstructive pulmonary disease, unspecified: Secondary | ICD-10-CM | POA: Insufficient documentation

## 2024-01-19 DIAGNOSIS — Z950 Presence of cardiac pacemaker: Secondary | ICD-10-CM | POA: Insufficient documentation

## 2024-01-19 DIAGNOSIS — N390 Urinary tract infection, site not specified: Secondary | ICD-10-CM | POA: Insufficient documentation

## 2024-01-19 DIAGNOSIS — Z79899 Other long term (current) drug therapy: Secondary | ICD-10-CM | POA: Insufficient documentation

## 2024-01-19 DIAGNOSIS — I110 Hypertensive heart disease with heart failure: Secondary | ICD-10-CM | POA: Diagnosis not present

## 2024-01-19 DIAGNOSIS — R55 Syncope and collapse: Secondary | ICD-10-CM | POA: Diagnosis present

## 2024-01-19 DIAGNOSIS — I447 Left bundle-branch block, unspecified: Secondary | ICD-10-CM | POA: Diagnosis not present

## 2024-01-19 DIAGNOSIS — Z7901 Long term (current) use of anticoagulants: Secondary | ICD-10-CM | POA: Diagnosis not present

## 2024-01-19 DIAGNOSIS — R059 Cough, unspecified: Secondary | ICD-10-CM | POA: Diagnosis present

## 2024-01-19 DIAGNOSIS — N3001 Acute cystitis with hematuria: Secondary | ICD-10-CM

## 2024-01-19 LAB — CBC WITH DIFFERENTIAL/PLATELET
Abs Immature Granulocytes: 0.04 K/uL (ref 0.00–0.07)
Basophils Absolute: 0 K/uL (ref 0.0–0.1)
Basophils Relative: 0 %
Eosinophils Absolute: 0.1 K/uL (ref 0.0–0.5)
Eosinophils Relative: 1 %
HCT: 38.1 % (ref 36.0–46.0)
Hemoglobin: 11.8 g/dL — ABNORMAL LOW (ref 12.0–15.0)
Immature Granulocytes: 0 %
Lymphocytes Relative: 8 %
Lymphs Abs: 0.8 K/uL (ref 0.7–4.0)
MCH: 26.6 pg (ref 26.0–34.0)
MCHC: 31 g/dL (ref 30.0–36.0)
MCV: 86 fL (ref 80.0–100.0)
Monocytes Absolute: 0.8 K/uL (ref 0.1–1.0)
Monocytes Relative: 8 %
Neutro Abs: 8.2 K/uL — ABNORMAL HIGH (ref 1.7–7.7)
Neutrophils Relative %: 83 %
Platelets: 200 K/uL (ref 150–400)
RBC: 4.43 MIL/uL (ref 3.87–5.11)
RDW: 14.7 % (ref 11.5–15.5)
WBC: 10 K/uL (ref 4.0–10.5)
nRBC: 0 % (ref 0.0–0.2)

## 2024-01-19 LAB — URINALYSIS, W/ REFLEX TO CULTURE (INFECTION SUSPECTED)
Bilirubin Urine: NEGATIVE
Glucose, UA: NEGATIVE mg/dL
Ketones, ur: NEGATIVE mg/dL
Nitrite: NEGATIVE
Protein, ur: 30 mg/dL — AB
Specific Gravity, Urine: 1.013 (ref 1.005–1.030)
WBC, UA: >50 WBC/hpf (ref 0–5)
pH: 7 (ref 5.0–8.0)

## 2024-01-19 LAB — BASIC METABOLIC PANEL WITH GFR
Anion gap: 9 (ref 5–15)
BUN: 19 mg/dL (ref 8–23)
CO2: 27 mmol/L (ref 22–32)
Calcium: 9.9 mg/dL (ref 8.9–10.3)
Chloride: 100 mmol/L (ref 98–111)
Creatinine, Ser: 0.73 mg/dL (ref 0.44–1.00)
GFR, Estimated: >60 mL/min (ref >60)
Glucose, Bld: 87 mg/dL (ref 70–99)
Potassium: 3.5 mmol/L (ref 3.5–5.1)
Sodium: 136 mmol/L (ref 135–145)

## 2024-01-19 LAB — RESP PANEL BY RT-PCR (RSV, FLU A&B, COVID)  RVPGX2
Influenza A by PCR: NEGATIVE
Influenza B by PCR: NEGATIVE
Resp Syncytial Virus by PCR: NEGATIVE
SARS Coronavirus 2 by RT PCR: NEGATIVE

## 2024-01-19 LAB — TROPONIN I (HIGH SENSITIVITY)
Troponin I (High Sensitivity): 5 ng/L (ref <18)
Troponin I (High Sensitivity): 4 ng/L (ref <18)

## 2024-01-19 LAB — BRAIN NATRIURETIC PEPTIDE: B Natriuretic Peptide: 43.2 pg/mL (ref 0.0–100.0)

## 2024-01-19 LAB — CBG MONITORING, ED: Glucose-Capillary: 99 mg/dL (ref 70–99)

## 2024-01-19 MED ORDER — CIPROFLOXACIN HCL 500 MG PO TABS
500.0000 mg | ORAL_TABLET | Freq: Once | ORAL | Status: AC
  Administered 2024-01-19 (×2): 500 mg via ORAL
  Filled 2024-01-19: qty 1

## 2024-01-19 MED ORDER — LACTATED RINGERS IV BOLUS
500.0000 mL | Freq: Once | INTRAVENOUS | Status: AC
  Administered 2024-01-19 (×2): 500 mL via INTRAVENOUS

## 2024-01-19 MED ORDER — CIPROFLOXACIN HCL 500 MG PO TABS: 500.0000 mg | ORAL_TABLET | Freq: Two times a day (BID) | ORAL | 0 refills | Status: DC

## 2024-01-19 NOTE — ED Provider Notes (Signed)
 Kathleen [name] Provider Note   CSN: [id number] Arrival date & time: 01/19/24  1204     Patient presents with: Syncopal Event  HPI Kathleen [name] is a 78 y.o. 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  She reports that occurred twice this morning.  She is coming from [facility] by EMS.  States she is fairly independent.  States she was making herself some food and walked to the couch when she all of a sudden passed out without warning.  She states she landed in the couch denies head injury or loss of consciousness.  She called the nursing staff there for help.  They came to her room and then witnessed another episode of syncope where she passed out without warning on the couch again.  She denies any associated chest pain, shortness of breath or palpitations.  Does report she started to have a productive cough yesterday evening and felt fatigued and nauseous.  Denies abdominal pain vomiting or diarrhea.  Denies urinary symptoms.  Also reports potassium supplements recently increased that she believes that this could be contributing to her symptoms today.   HPI     Prior to Admission medications   Medication Sig Start Date End Date Taking? Authorizing Provider  Cholecalciferol  (VITAMIN D3) 50 MCG (2000 UT) TABS Take 2,000 Units by mouth at bedtime.    [provider]  cyanocobalamin  (VITAMIN B12) 1000 MCG tablet Take 1,000 mcg by mouth daily.    [provider]  docusate sodium  (COLACE) 100 MG capsule Take 100 mg by mouth daily.    [provider]  dofetilide  (TIKOSYN ) 250 MCG capsule Take 1 capsule (250 mcg total) by mouth in the morning and at bedtime. 07/23/23 07/22/24  Rolan Ezra RAMAN, MD  ELIQUIS  5 MG TABS tablet Take 1 tablet (5 mg total) by mouth 2 (two) times daily. 09/23/23   Bensimhon, Toribio SAUNDERS, MD  febuxostat  (ULORIC ) 40 MG tablet Take 1 tablet  (40 mg total) by mouth daily. 10/06/23   Rice, Lonni ORN, MD  fluticasone  furoate-vilanterol (BREO ELLIPTA ) 100-25 MCG/ACT AEPB INHALE 1 PUFF INTO THE LUNGS DAILY 03/23/23   Jude Harden GAILS, MD  gabapentin  (NEURONTIN ) 300 MG capsule Take 1 capsule (300 mg total) by mouth at bedtime. 09/23/23 09/17/24  Gregg Lek, MD  ketoconazole  (NIZORAL ) 2 % cream Apply 1 Application topically 2 (two) times daily. 10/21/23   Joshua Debby CROME, MD  levalbuterol  (XOPENEX ) 0.63 MG/3ML nebulizer solution Take 3 mLs (0.63 mg total) by nebulization every 6 (six) hours as needed for wheezing or shortness of breath. DX J44.89 J96.21 01/20/23   Jude Harden GAILS, MD  Magnesium  500 MG TABS Take 500 mg by mouth every evening.    [provider]  meclizine  (ANTIVERT ) 12.5 MG tablet Take 1 tablet (12.5 mg total) by mouth 3 (three) times daily as needed for dizziness. 12/06/23   Alvia Corean CROME, FNP  mupirocin  ointment (BACTROBAN ) 2 % Apply 1 Application topically 2 (two) times daily. 10/22/23   Joshua Debby CROME, MD  naloxone  (NARCAN ) nasal spray 4 mg/0.1 mL Place 1 spray into the nose once as needed (opioid overdose). 11/12/23   [provider]  oxyCODONE -acetaminophen  (PERCOCET/ROXICET) 5-325 MG tablet Take 1 tablet by mouth every 8 (eight) hours as needed for moderate pain (pain score 4-6). 11/16/23   [provider]  OXYGEN  Inhale 4 L into the lungs continuous. Use with  resmed ventilator    [provider]  potassium chloride  (KLOR-CON ) 10 MEQ tablet Take 4 tablets (40 mEq total) by mouth daily. 01/05/24 04/04/24  Glena Harlene HERO, FNP  revefenacin  (YUPELRI ) 175 MCG/3ML nebulizer solution Take 175 mcg by nebulization daily.    [provider]  spironolactone  (ALDACTONE ) 25 MG tablet Take 1 tablet (25 mg total) by mouth every evening. 01/10/24   Lee, [place], NP  torsemide  (DEMADEX ) 20 MG tablet Take 2 tablets (40 mg total) by mouth daily. 01/10/24   Lee, [place], NP  triamcinolone  cream  (KENALOG ) 0.1 % Apply 1 Application topically 2 (two) times daily. 07/02/23   Cyndi Shaver, PA-C    Allergies: Allopurinol, Clindamycin, Flublok [influenza vaccine recombinant], Pneumococcal 13-val conj vacc, Dronedarone, Montelukast , Brovana  [arformoterol ], Budesonide , Entresto  [sacubitril -valsartan ], Fosamax [alendronate sodium], Jardiance  [empagliflozin ], Meperidine, Microplegia msa-msg [plegisol], Rosuvastatin, Tetracycline, Adhesive [tape], and Lovastatin    Review of Systems See HPI   Physical Exam   Vitals:   01/19/24 1500 01/19/24 1515  BP: 126/64 (!) 134/58  Pulse:  70  Resp: (!) 24 (!) 23  Temp:    SpO2:  100%    CONSTITUTIONAL:  well-appearing, NAD NEURO:  Alert and oriented x 3, CN 3-12 grossly intact EYES:  eyes equal and reactive ENT/NECK:  Supple, no stridor CARDIO:  regular rate and rhythm, appears well-perfused PULM:  No respiratory distress, CTAB, on 4L GI/GU:  non-distended, soft, non tender MSK/SPINE:  No gross deformities, no edema, moves all extremities  SKIN:  no rash, atraumatic  *Additional and/or pertinent findings included in MDM below    (all labs ordered are listed, but only abnormal results are displayed) Labs Reviewed  RESP PANEL BY RT-PCR (RSV, FLU A&B, COVID)  [id number]  BASIC METABOLIC PANEL WITH GFR  CBC WITH DIFFERENTIAL/PLATELET  URINALYSIS, W/ REFLEX TO CULTURE (INFECTION SUSPECTED)  BRAIN NATRIURETIC PEPTIDE  CBG MONITORING, ED  TROPONIN I (HIGH SENSITIVITY)  TROPONIN I (HIGH SENSITIVITY)    EKG: None  Radiology: [facility] Chest Port 1 View Result Date: 01/19/2024 CLINICAL DATA:  Cough. EXAM: PORTABLE CHEST 1 VIEW COMPARISON:  01/11/2024. FINDINGS: Stable cardiomegaly. Mediastinal contours are unchanged. Stable left-sided pacemaker. Overlapping telemetry wires. No focal consolidation, sizeable pleural effusion, or pneumothorax. 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 Moderate left glenohumeral osteoarthritis. No acute osseous abnormality. IMPRESSION: 1. No acute cardiopulmonary findings. 2. Stable cardiomegaly. Electronically Signed   By: Harrietta Sherry M.D.   On: 01/19/2024 14:09     Procedures   Medications Ordered in the ED - No data to display  Clinical Course as of 01/19/24 1549  Wed Jan 19, 2024  1523 Had syncopal episode today falling onto the couch called EMS and then w/ EMS had a near syncopal episode. Patient reporting increase K supplement here. No other complaints/pain.  [JB]    Clinical Course User Index [JB] Barrett, Warren SAILOR, PA-C                                 Medical Decision Making Amount and/or Complexity of Data Reviewed Labs: ordered. Radiology: ordered.   Initial Impression and Ddx 78 year old well-appearing female presenting for syncope.  Exam was unremarkable.  DDx includes arrhythmia, PE, electrolyte derangement, orthostatic hypotension, seizure, pacemaker problem, other. 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  Also planning to interrogate the pacemaker.  Will likely need cardiology consult for high risk syncope.  Signed out patient to PA [facility].  At this time she is well-appearing no acute distress no syncopal episodes witnessed here and hemodynamically stable.  Patient management required discussion with the following  services or consulting groups:  None  Complexity of Problems Addressed Acute complicated illness or Injury  Additional Data Reviewed and Analyzed Further history obtained from: Past medical history and medications listed in the EMR and Prior ED visit notes  Patient Encounter Risk Assessment Consideration of hospitalization      Final diagnoses:  Syncope, unspecified syncope type    ED Discharge Orders     None          Lang Norleen POUR, PA-C 01/19/24 1549    Dreama Longs, MD 01/21/24 2213

## 2024-01-19 NOTE — Group Note (Deleted)
 Date:  01/19/2024 Time:  2:21 PM  Group Topic/Focus:  Wellness Toolbox:   The focus of this group is to discuss various aspects of wellness, balancing those aspects and exploring ways to increase the ability to experience wellness.  Patients will create a wellness toolbox for use upon discharge.     Participation Level:  {BHH PARTICIPATION OZCZO:77735}  Participation Quality:  {BHH PARTICIPATION QUALITY:22265}  Affect:  {BHH AFFECT:22266}  Cognitive:  {BHH COGNITIVE:22267}  Insight: {BHH Insight2:20797}  Engagement in Group:  {BHH ENGAGEMENT IN HMNLE:77731}  Modes of Intervention:  {BHH MODES OF INTERVENTION:22269}  Additional Comments:  ***  Myra Curtistine BROCKS 01/19/2024, 2:21 PM

## 2024-01-19 NOTE — Discharge Instructions (Addendum)
 Workup here overall looks okay.  Your urinalysis is concerning for a urinary tract infection.  I have started you on Cipro  which is an antibiotic since you have done well with this in the past.  Please return to emergency room with new or worsening symptoms and follow-up with primary care.

## 2024-01-19 NOTE — ED Provider Notes (Signed)
  Physical Exam  BP (!) 134/58   Pulse 70   Temp 98 F (36.7 C) (Oral)   Resp (!) 23   Ht 5' (1.524 m)   Wt 83 kg   SpO2 100%   BMI 35.74 kg/m   Physical Exam Cardiovascular:     Rate and Rhythm: Normal rate and regular rhythm.  Pulmonary:     Effort: Pulmonary effort is normal.     Breath sounds: Normal breath sounds.     Comments: 4L [place] Abdominal:     General: Abdomen is flat. Bowel sounds are normal.     Palpations: Abdomen is soft.     Comments: ostomy bag in lower quadrant draining     Procedures  Procedures  ED Course / MDM   Clinical Course as of 01/19/24 1611  Wed Jan 19, 2024  1523 Had syncopal episode today falling onto the couch called EMS and then w/ EMS had a near syncopal episode. Patient reporting increase K supplement and increase in Torsemide  last week. Has had productive cough and nausea for about one day. [JB]    Clinical Course User Index [JB] Ferry Matthis, Warren SAILOR, PA-C   Medical Decision Making Amount and/or Complexity of Data Reviewed Labs: ordered. Radiology: ordered.  Risk Prescription drug management.   Patient presents with complaint of syncopal episode that occurred earlier today.  At time of sign off labs and urine were pending.  Please see prior ED providers note for further details.   Patient's lab work overall reassuring without any significant bump in creatinine or electrolyte abnormalities. Her orthostatics are negative.  She is feeling better and asymptomatic after receiving half a liter here. EKG shows paced rhythm.   She has recently increased her diuretic which I suspect may have caused this episode.  We discussed taking half of this dose versus holding it for 2 days before restarting. Will follow up with cardiology.  Also has bacteria, white blood cell and large leukocytes in urine.  Patient denies any urinary symptoms but given grossly positive UA will treat. SIRS negative.  Not tachycardic.  Not febrile.  Has normal white blood  cell count.  Patient has done well with Cipro  in the past for urinary tract infection thus we will do 5 days of Cipro .  The urine has been sent for culture.   Patient was observed in emergency room with stable vital signs. We did discuss possible admission.  She feels comfortable going home and is requesting to be did discharged.  Feel stable for discharge with outpatient follow-up     Shermon Aicha Clingenpeel N, PA-C 01/19/24 1915    Kommor, [place], MD 01/20/24 [phone]

## 2024-01-19 NOTE — ED Triage Notes (Signed)
 Pt BIB [facility] from [facility] where she lives fairly independently & after she made herself some food she walked to the couch & felt like she had LOC. When she woke up pt called staff & EMS was called. EMS reports she did have a near syncopal event with them but remained A/Ox4, while en route to ED. EMS reports no injuries noted, 136/76, paced at 70bpm on 12L, CBG 150, 98% on 4L O2 via n/c that she wears at baseline, 20g Lt AC PIV. Pt reports her potassium supplements were increased & she hasn't felt well since then & is thinking what happened today is related to that (per pt).

## 2024-01-21 ENCOUNTER — Encounter: Payer: Self-pay | Admitting: Cardiology

## 2024-01-21 ENCOUNTER — Other Ambulatory Visit (HOSPITAL_COMMUNITY)

## 2024-01-21 ENCOUNTER — Encounter (HOSPITAL_COMMUNITY): Payer: Self-pay

## 2024-01-21 LAB — URINE CULTURE: Culture: <10000 — AB

## 2024-01-24 ENCOUNTER — Ambulatory Visit (INDEPENDENT_AMBULATORY_CARE_PROVIDER_SITE_OTHER): Admitting: Sports Medicine

## 2024-01-24 ENCOUNTER — Encounter: Payer: Self-pay | Admitting: Sports Medicine

## 2024-01-24 DIAGNOSIS — M7918 Myalgia, other site: Secondary | ICD-10-CM | POA: Diagnosis not present

## 2024-01-24 DIAGNOSIS — M76899 Other specified enthesopathies of unspecified lower limb, excluding foot: Secondary | ICD-10-CM

## 2024-01-24 DIAGNOSIS — M533 Sacrococcygeal disorders, not elsewhere classified: Secondary | ICD-10-CM | POA: Diagnosis not present

## 2024-01-24 DIAGNOSIS — M25511 Pain in right shoulder: Secondary | ICD-10-CM

## 2024-01-24 DIAGNOSIS — G8929 Other chronic pain: Secondary | ICD-10-CM

## 2024-01-24 DIAGNOSIS — M25512 Pain in left shoulder: Secondary | ICD-10-CM

## 2024-01-24 NOTE — Progress Notes (Signed)
 Patient had bilateral SI joint injections in April. She says that the left side did very well, and is only now beginning to notice that her discomfort is coming back. She got no relief from the right side, and says that she has increased pain when she sits for extended periods of time, walks for extended periods of time, or lays in bed. She is going to pain management, although the medicine she is taking as prescribed has not been helpful for her pain. She points over ischial tuberosity when describing her pain.

## 2024-01-24 NOTE — Progress Notes (Signed)
 Kathleen [name] - 78 y.o. female MRN [id number]  Date of birth: 1946/05/23  Office Visit Note: Visit Date: 01/24/2024 PCP: Cherrie Toribio SAUNDERS, MD Referred by: Joshua Debby CROME, MD  Subjective: Chief Complaint  Patient presents with   Lower Back - Pain   HPI: Kathleen [name] is a pleasant 78 y.o. female who presents today for follow-up of chronic low back pain, SI joint pain.  Also with bilateral shoulder pain.  Low back/SI joint/buttocks -back in April we did perform both left and right SI joint injection under ultrasound guidance.  She had excellent relief of her pain from the left SI joint, but she did not receive significant relief from the right side.  More of her pain now is emanating right over the ischial tuberosity, as she points to this location.  She has pain with prolonged sitting.  She will need to change positions frequently to get comfortable.  He also has pain with walking for periods of time.  Shoulders -she has concomitant rotator cuff arthropathy as well as osteoarthritic change of the shoulders.  Dr. Addie did performed ultrasound-guided injection for the left shoulder back in May which gave her very good relief.  Her right shoulder is bone-on-bone and so she has not had injection therapy for this.  She does see chronic pain management.  She is managed on Percocet 5-325 mg every 8 hours as needed.  She recently was discussing with her pain medicine physician who considered going up to an additional 4 times daily.  She does take 1 tablet up to 2 tablets at nighttime if needed depending upon her pain.  Pertinent ROS were reviewed with the patient and found to be negative unless otherwise specified above in HPI.   Assessment & Plan: Visit Diagnoses:  1. Hamstring tendinitis at origin   2. Chronic left SI joint pain   3. Bilateral buttock pain   4. Chronic pain of both shoulders    Plan: Impression is chronic bilateral buttock/SI joint related pain.  She had good relief  from SI joint injection back in April, although no significant pain from her right buttock pain.  Today more for pain is emanating from the ischial bursa/insertional hamstrings.  We discussed further evaluating this to see her true pathology whether this is ischial tuberosity bursitis versus degree of insertional hamstring tearing.  We did discuss the role for an ultrasound-guided injection in this location, although would want to see her exact pathology before proceeding with this.  Could consider repeat left SI joint injection under ultrasound guidance in the future as well if needed.  She will continue her PT/HEP.  In terms of her shoulders, she did get good relief from previous image based injection with Dr. Addie for the left shoulder, recommended she seek follow-up with him to repeat.  Her right shoulder has end-stage arthritic change, could consider injection but likely would not give prolonged benefit.  She is plugged in with pain management and will continue her Percocet 5-325 mg 3 times daily to 4 times daily as needed.  I will review her MRI of the pelvis and then reach out in terms of follow-up/treatment depending on these results.  This will need performed at the hospital as she does have an MRI, but reports MRI compatibility.  Follow-up: Return for I will message/call once MRI reviewed to discuss next steps .   Meds & Orders: No orders of the defined types were placed in this encounter.   Orders Placed This  Encounter  Procedures   MR Pelvis w/o contrast     Procedures: No procedures performed      Clinical History: No specialty comments available.  She reports that she quit smoking about 36 years ago. Her smoking use included cigarettes. She started smoking about 56 years ago. She has a 20 pack-year smoking history. She has been exposed to tobacco smoke. She has never used smokeless tobacco.  Recent Labs    01/27/23 1137 02/22/23 2013 04/23/23 2314 05/24/23 0334 06/30/23 1122   HGBA1C  --  5.5 6.2*  --  5.9  LABURIC 7.3*  --   --  8.6*  --     Objective:    Physical Exam  Gen: Well-appearing, in no acute distress; non-toxic CV: Well-perfused. Warm.  Resp: Breathing unlabored on room air; no wheezing. Psych: Fluid speech in conversation; appropriate affect; normal thought process  Ortho Exam - Shoulders: She has limited mobility in bilateral shoulders although the right shoulder has gross restriction in both passive and active range of motion.  - SI/Buttocks: Mild TTP over the left SI joint region but certainly improved from previous visit.  She has point tenderness over the ischial bursa on the right side.  She does have weakness with activation of the hamstrings.  No significant redness or swelling in this location.  Patient does walk with the assistance of a walker.  Imaging:  10/18/23: Shoulder XR's - AP lateral outlet radiographs right shoulder reviewed.  Severe rotator  cuff arthropathy is present along with glenohumeral joint arthritis.   Acetabular rotation of the humeral head acromial interface is present.  No  acute fracture.  Shoulder is located.  Visualized lung fields intact.   Past Medical/Family/Surgical/Social History: Medications & Allergies reviewed per EMR, new medications updated. Patient Active Problem List   Diagnosis Date Noted   Long term (current) use of anticoagulants 12/12/2023   BPPV (benign paroxysmal positional vertigo), bilateral 12/06/2023   BPPV (benign paroxysmal positional vertigo) 11/28/2023   UTI (urinary tract infection) 11/26/2023   Dizziness 11/26/2023   S/P AV nodal ablation 11/26/2023   GERD (gastroesophageal reflux disease) 11/26/2023   Colostomy in place [facility]) 11/26/2023   History of diverticulitis 11/26/2023   Tinea corporis 10/21/2023   Surgical wound, non healing 09/10/2023   Colostomy complication (HCC) 08/18/2023   Irritant contact dermatitis associated with fecal stoma 08/13/2023   Colostomy care  (HCC) 08/13/2023   Wound infection 08/04/2023   Dyslipidemia 08/03/2023   Candida esophagitis (HCC) 05/24/2023   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   Pill dysphagia 04/06/2023   COPD exacerbation (HCC) 03/26/2023   Absolute anemia 03/17/2023   Chronic nausea 03/17/2023   Hypokalemia 02/23/2023   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   Carpal tunnel syndrome, right upper limb 01/27/2023   Hypercoagulable state (HCC) 11/18/2022   COPD with acute exacerbation (HCC) 10/09/2022   Chronic obstructive pulmonary disease (HCC) 06/28/2022   Palliative care encounter 06/28/2022   Anticoagulated by anticoagulation treatment 06/11/2022   Supplemental oxygen  dependent 06/11/2022   Heart failure with reduced ejection fraction (HCC) 05/14/2022   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   Depression, major, single episode, moderate (HCC) 04/27/2022   Obesity (BMI 30-39.9) 03/18/2022   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   Low TSH level 12/02/2021   Acquired hypothyroidism 11/12/2021   Encounter for palliative care involving management of pain 11/12/2021   Chronic left-sided low back pain 11/04/2021   Gout 10/01/2021   Hyperlipidemia with target LDL less than 100 08/07/2021   Chronic  anticoagulation 07/07/2021   Stage 3a chronic kidney disease (HCC) 05/23/2021   Dietary iron deficiency 05/23/2021   Age-related osteoporosis without current pathological fracture 04/22/2021   Type 2 diabetes mellitus with diabetic neuropathy, without long-term current use of insulin  (HCC) 04/22/2021   Sleep apnea 01/22/2021   Physical deconditioning 01/16/2020   Statin intolerance 10/12/2019   Statin myopathy 02/24/2019   Chronic combined systolic and diastolic heart failure (HCC) 04/06/2018   HTN (hypertension) 04/06/2018   COPD (chronic obstructive pulmonary disease) (HCC) 04/06/2018   Persistent atrial fibrillation (HCC) 04/06/2018   Chronic respiratory failure with hypoxia (HCC)  03/28/2018   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 12/02/2015   Protein-calorie malnutrition, moderate (HCC) 09/11/2015   Past Medical History:  Diagnosis Date   Arthritis    Asthma    Blood transfusion without reported diagnosis 2015   in hospital   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HCC)    Cataract 2016   very small   CHF (congestive heart failure) (HCC)    Chronic renal insufficiency    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    Concussion 10/04/2021   COPD (chronic obstructive pulmonary disease) (HCC)    Diverticulitis    Emphysema of lung (HCC) 2013   lung collapsed   Gout    Hypertension    Longstanding persistent atrial fibrillation (HCC)    on Xarelto    Nonischemic cardiomyopathy (HCC)    Obesity    Oxygen  deficiency 2013   Pacemaker    Spontaneous pneumothorax 2013   Thyroid  disease 1990   Family History  Problem Relation Age of Onset   Breast cancer Mother 109       3 different times   Pancreatic cancer Mother    Emphysema Father    Healthy Brother    Breast cancer Cousin    Valvular heart disease Son    Obesity Daughter    Colon cancer Neg Hx    Esophageal cancer Neg Hx    Stomach cancer Neg Hx    Inflammatory bowel disease Neg Hx    Liver disease Neg Hx    Rectal cancer Neg Hx    Past Surgical History:  Procedure Laterality Date   ABDOMINAL HYSTERECTOMY     APPENDECTOMY     APPLICATION OF WOUND VAC  04/27/2023   Procedure: APPLICATION OF WOUND VAC;  Surgeon: Ebbie Cough, MD;  Location: [facility] OR;  Service: General;;   ATRIAL FIBRILLATION ABLATION  07/20/2013   by Dr Pierrette   AV nodal ablation  11/01/2013   by Dr Pierrette, repeated by Dr Rosine   BREAST BIOPSY Bilateral 1997   negative   CARDIAC CATHETERIZATION     CESAREAN SECTION  1976   CHOLECYSTECTOMY     COLON RESECTION SIGMOID N/A 04/27/2023   Procedure: COLON RESECTION SIGMOID WITH COLOSTOMY;  Surgeon: Ebbie Cough, MD;   Location: [facility] OR;  Service: General;  Laterality: N/A;   COLON SURGERY  2024   ESOPHAGOGASTRODUODENOSCOPY (EGD) WITH PROPOFOL  N/A 05/24/2023   Procedure: ESOPHAGOGASTRODUODENOSCOPY (EGD) WITH PROPOFOL ;  Surgeon: Albertus Gordy HERO, MD;  Location[facility] ENDOSCOPY;  Service: Gastroenterology;  Laterality: N/A;   HERNIA REPAIR     LAPAROTOMY N/A 04/27/2023   Procedure: EXPLORATORY LAPAROTOMY;  Surgeon: Ebbie Cough, MD;  Location: [facility] OR;  Service: General;  Laterality: N/A;   PACEMAKER INSERTION  06/2017   MDT Viva CRT-P implanted by Dr Pierrette after AV nodal ablation,  LV lead could not be placed  PACEMAKER INSERTION  05/2020   with lead bundle   RIGHT/LEFT HEART CATH AND CORONARY ANGIOGRAPHY N/A 03/20/2022   Procedure: RIGHT/LEFT HEART CATH AND CORONARY ANGIOGRAPHY;  Surgeon: Swaziland, Peter M, MD;  Location: [facility] INVASIVE CV LAB;  Service: Cardiovascular;  Laterality: N/A;   TUBAL LIGATION  1976   Social History   Occupational History   Occupation: retired  Tobacco Use   Smoking status: Former    Current packs/day: 0.00    Average packs/day: 1 pack/day for 20.0 years (20.0 ttl pk-yrs)    Types: Cigarettes    Start date: 06/09/1967    Quit date: 06/09/1987    Years since quitting: 36.6    Passive exposure: Past   Smokeless tobacco: Never   Tobacco comments:    Former smoker 12/25/21  Vaping Use   Vaping status: Never Used  Substance and Sexual Activity   Alcohol use: No    Alcohol/week: 0.0 standard drinks of alcohol   Drug use: No   Sexual activity: Not Currently

## 2024-01-25 ENCOUNTER — Ambulatory Visit

## 2024-01-25 ENCOUNTER — Encounter: Payer: Self-pay | Admitting: Internal Medicine

## 2024-01-25 ENCOUNTER — Ambulatory Visit: Attending: Internal Medicine | Admitting: Internal Medicine

## 2024-01-25 VITALS — BP 102/54 | HR 70 | Resp 19 | Ht 60.0 in | Wt 188.7 lb

## 2024-01-25 DIAGNOSIS — M79672 Pain in left foot: Secondary | ICD-10-CM | POA: Diagnosis present

## 2024-01-25 DIAGNOSIS — M10042 Idiopathic gout, left hand: Secondary | ICD-10-CM | POA: Insufficient documentation

## 2024-01-25 DIAGNOSIS — N1831 Chronic kidney disease, stage 3a: Secondary | ICD-10-CM | POA: Diagnosis not present

## 2024-01-25 DIAGNOSIS — G8929 Other chronic pain: Secondary | ICD-10-CM | POA: Diagnosis present

## 2024-01-25 DIAGNOSIS — Z5181 Encounter for therapeutic drug level monitoring: Secondary | ICD-10-CM | POA: Insufficient documentation

## 2024-01-25 DIAGNOSIS — G5601 Carpal tunnel syndrome, right upper limb: Secondary | ICD-10-CM | POA: Insufficient documentation

## 2024-01-25 DIAGNOSIS — M25559 Pain in unspecified hip: Secondary | ICD-10-CM | POA: Diagnosis present

## 2024-01-25 DIAGNOSIS — G5602 Carpal tunnel syndrome, left upper limb: Secondary | ICD-10-CM | POA: Insufficient documentation

## 2024-01-25 DIAGNOSIS — M25512 Pain in left shoulder: Secondary | ICD-10-CM | POA: Diagnosis present

## 2024-01-25 MED ORDER — FEBUXOSTAT 40 MG PO TABS: 40.0000 mg | ORAL_TABLET | Freq: Every day | ORAL | 1 refills | Status: AC

## 2024-01-26 ENCOUNTER — Ambulatory Visit (INDEPENDENT_AMBULATORY_CARE_PROVIDER_SITE_OTHER): Admitting: Orthopedic Surgery

## 2024-01-26 ENCOUNTER — Other Ambulatory Visit: Payer: Self-pay

## 2024-01-26 DIAGNOSIS — M12812 Other specific arthropathies, not elsewhere classified, left shoulder: Secondary | ICD-10-CM

## 2024-01-26 LAB — URIC ACID: Uric Acid, Serum: 6.6 mg/dL (ref 2.5–7.0)

## 2024-01-27 ENCOUNTER — Encounter: Payer: Self-pay | Admitting: Orthopedic Surgery

## 2024-01-27 NOTE — Progress Notes (Signed)
 Office Visit Note   Patient: Kathleen [name]           Date of Birth: [birth year]           MRN: [id number] Visit Date: 01/26/2024 Requested by: Cherrie Toribio SAUNDERS, MD [address] [place],  [place] [address] PCP: Cherrie Toribio SAUNDERS, MD  Subjective: Chief Complaint  Patient presents with   Left Shoulder - Pain   Right Shoulder - Pain    HPI: Kathleen [name] is a 78 y.o. female who presents to the office reporting bilateral shoulder pain left worse than right.  She states she would like to have an injection in the left shoulder today if possible.  She is right-hand dominant and ambulates with a walker.  Not really a surgical candidate because of medical comorbidities.  Had prior left shoulder injection 10/18/2023 which helped until a few weeks ago..                ROS: All systems reviewed are negative as they relate to the chief complaint within the history of present illness.  Patient denies fevers or chills.  Assessment & Plan: Visit Diagnoses:  1. Rotator cuff arthropathy, left     Plan: Impression is bilateral shoulder arthritis with left and right sided symptomatic shoulders.  Left-sided glenohumeral joint injection performed today.  2-week return for right-sided glenohumeral joint injection.  We will try to do 2 or at most 3 injections/year for the shoulders as long as they continue to work.  Follow-Up Instructions: No follow-ups on file.   Orders:  Orders Placed This Encounter  Procedures   US  Guided Needle Placement - No Linked Charges   No orders of the defined types were placed in this encounter.     Procedures: Large Joint Inj: L glenohumeral on 01/26/2024 10:45 AM Indications: diagnostic evaluation and pain Details: 22 G 3.5 in needle, ultrasound-guided posterior approach  Arthrogram: No  Medications: 9 mL bupivacaine  0.5 %; 5 mL lidocaine  1 %; 40 mg triamcinolone  acetonide 40 MG/ML Outcome: tolerated well, no immediate complications Procedure,  treatment alternatives, risks and benefits explained, specific risks discussed. Consent was given by the patient. Immediately prior to procedure a time out was called to verify the correct patient, procedure, equipment, support staff and site/side marked as required. Patient was prepped and draped in the usual sterile fashion.       Clinical Data: No additional findings.  Objective: Vital Signs: There were no vitals taken for this visit.  Physical Exam:  Constitutional: Patient appears well-developed HEENT:  Head: Normocephalic Eyes:EOM are normal Neck: Normal range of motion Cardiovascular: Normal rate Pulmonary/chest: Effort normal Neurologic: Patient is alert Skin: Skin is warm Psychiatric: Patient has normal mood and affect  Ortho Exam: Ortho exam demonstrates range of motion of active forward flexion on the left of 80 degrees active forward flexion on the right of 70 degrees.  Patient states she cannot really turn the bacon because of her shoulder pain.  Ortho exam demonstrates functional deltoid on both sides but diminished active forward flexion abduction on both sides.  Does have some coarseness with passive range of motion in both shoulders.  Deltoid does fire bilaterally.  Specialty Comments:  No specialty comments available.  Imaging: US  Guided Needle Placement - No Linked Charges Result Date: 01/27/2024 Ultrasound imaging demonstrates needle placement into the glenohumeral joint with injection of fluid into the joint and no complicating features. Left shoulder    PMFS History: Patient Active Problem  List   Diagnosis Date Noted   Pain of left heel 01/25/2024   Long term (current) use of anticoagulants 12/12/2023   BPPV (benign paroxysmal positional vertigo), bilateral 12/06/2023   BPPV (benign paroxysmal positional vertigo) 11/28/2023   UTI (urinary tract infection) 11/26/2023   Dizziness 11/26/2023   S/P AV nodal ablation 11/26/2023   GERD (gastroesophageal  reflux disease) 11/26/2023   Colostomy in place [facility]) 11/26/2023   History of diverticulitis 11/26/2023   Tinea corporis 10/21/2023   Surgical wound, non healing 09/10/2023   Colostomy complication (HCC) 08/18/2023   Irritant contact dermatitis associated with fecal stoma 08/13/2023   Colostomy care (HCC) 08/13/2023   Wound infection 08/04/2023   Dyslipidemia 08/03/2023   Candida esophagitis (HCC) 05/24/2023   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   Pill dysphagia 04/06/2023   COPD exacerbation (HCC) 03/26/2023   Absolute anemia 03/17/2023   Chronic nausea 03/17/2023   Hypokalemia 02/23/2023   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   Carpal tunnel syndrome, right upper limb 01/27/2023   Hypercoagulable state (HCC) 11/18/2022   COPD with acute exacerbation (HCC) 10/09/2022   Chronic obstructive pulmonary disease (HCC) 06/28/2022   Palliative care encounter 06/28/2022   Anticoagulated by anticoagulation treatment 06/11/2022   Supplemental oxygen  dependent 06/11/2022   Heart failure with reduced ejection fraction (HCC) 05/14/2022   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   Depression, major, single episode, moderate (HCC) 04/27/2022   Obesity (BMI 30-39.9) 03/18/2022   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   Low TSH level 12/02/2021   Acquired hypothyroidism 11/12/2021   Encounter for palliative care involving management of pain 11/12/2021   Chronic left-sided low back pain 11/04/2021   Gout 10/01/2021   Hyperlipidemia with target LDL less than 100 08/07/2021   Chronic anticoagulation 07/07/2021   Stage 3a chronic kidney disease (HCC) 05/23/2021   Dietary iron deficiency 05/23/2021   Age-related osteoporosis without current pathological fracture 04/22/2021   Type 2 diabetes mellitus with diabetic neuropathy, without long-term current use of insulin  (HCC) 04/22/2021   Sleep apnea 01/22/2021   Physical deconditioning 01/16/2020   Statin intolerance 10/12/2019    Statin myopathy 02/24/2019   Chronic combined systolic and diastolic heart failure (HCC) 04/06/2018   HTN (hypertension) 04/06/2018   COPD (chronic obstructive pulmonary disease) (HCC) 04/06/2018   Persistent atrial fibrillation (HCC) 04/06/2018   Chronic respiratory failure with hypoxia (HCC) 03/28/2018   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 12/02/2015   Protein-calorie malnutrition, moderate (HCC) 09/11/2015   Past Medical History:  Diagnosis Date   Arthritis    Asthma    Blood transfusion without reported diagnosis 2015   in hospital   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HCC)    Cataract 2016   very small   CHF (congestive heart failure) (HCC)    Chronic renal insufficiency    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    Concussion 10/04/2021   COPD (chronic obstructive pulmonary disease) (HCC)    Diverticulitis    Emphysema of lung (HCC) 2013   lung collapsed   Gout    Hypertension    Longstanding persistent atrial fibrillation (HCC)    on Xarelto    Nonischemic cardiomyopathy (HCC)    Obesity    Oxygen  deficiency 2013   Pacemaker    Spontaneous pneumothorax 2013   Thyroid  disease 1990    Family History  Problem Relation Age of Onset   Breast cancer Mother 62       3 different times   Pancreatic cancer  Mother    Emphysema Father    Healthy Brother    Breast cancer Cousin    Valvular heart disease Son    Obesity Daughter    Colon cancer Neg Hx    Esophageal cancer Neg Hx    Stomach cancer Neg Hx    Inflammatory bowel disease Neg Hx    Liver disease Neg Hx    Rectal cancer Neg Hx     Past Surgical History:  Procedure Laterality Date   ABDOMINAL HYSTERECTOMY     APPENDECTOMY     APPLICATION OF WOUND VAC  04/27/2023   Procedure: APPLICATION OF WOUND VAC;  Surgeon: Ebbie Cough, MD;  Location: [facility] OR;  Service: General;;   ATRIAL FIBRILLATION ABLATION  07/20/2013   by Dr Pierrette   AV nodal  ablation  11/01/2013   by Dr Pierrette, repeated by Dr Rosine   BREAST BIOPSY Bilateral 1997   negative   CARDIAC CATHETERIZATION     CESAREAN SECTION  1976   CHOLECYSTECTOMY     COLON RESECTION SIGMOID N/A 04/27/2023   Procedure: COLON RESECTION SIGMOID WITH COLOSTOMY;  Surgeon: Ebbie Cough, MD;  Location: [facility] OR;  Service: General;  Laterality: N/A;   COLON SURGERY  2024   ESOPHAGOGASTRODUODENOSCOPY (EGD) WITH PROPOFOL  N/A 05/24/2023   Procedure: ESOPHAGOGASTRODUODENOSCOPY (EGD) WITH PROPOFOL ;  Surgeon: Albertus Gordy HERO, MD;  Location: [facility] ENDOSCOPY;  Service: Gastroenterology;  Laterality: N/A;   HERNIA REPAIR     LAPAROTOMY N/A 04/27/2023   Procedure: EXPLORATORY LAPAROTOMY;  Surgeon: Ebbie Cough, MD;  Location: [facility] OR;  Service: General;  Laterality: N/A;   PACEMAKER INSERTION  06/2017   MDT Viva CRT-P implanted by Dr Pierrette after AV nodal ablation,  LV lead could not be placed   PACEMAKER INSERTION  05/2020   with lead bundle   RIGHT/LEFT HEART CATH AND CORONARY ANGIOGRAPHY N/A 03/20/2022   Procedure: RIGHT/LEFT HEART CATH AND CORONARY ANGIOGRAPHY;  Surgeon: Swaziland, Peter M, MD;  Location: [facility] INVASIVE CV LAB;  Service: Cardiovascular;  Laterality: N/A;   TUBAL LIGATION  1976   Social History   Occupational History   Occupation: retired  Tobacco Use   Smoking status: Former    Current packs/day: 0.00    Average packs/day: 1 pack/day for 20.0 years (20.0 ttl pk-yrs)    Types: Cigarettes    Start date: 06/09/1967    Quit date: 06/09/1987    Years since quitting: 36.6    Passive exposure: Past   Smokeless tobacco: Never   Tobacco comments:    Former smoker 12/25/21  Vaping Use   Vaping status: Never Used  Substance and Sexual Activity   Alcohol use: No    Alcohol/week: 0.0 standard drinks of alcohol   Drug use: No   Sexual activity: Not Currently

## 2024-01-29 MED ORDER — BUPIVACAINE HCL 0.5 % IJ SOLN
9.0000 mL | INTRAMUSCULAR | Status: AC | PRN
  Administered 2024-01-26: 9 mL via INTRA_ARTICULAR

## 2024-01-29 MED ORDER — TRIAMCINOLONE ACETONIDE 40 MG/ML IJ SUSP
40.0000 mg | INTRAMUSCULAR | Status: AC | PRN
  Administered 2024-01-26: 40 mg via INTRA_ARTICULAR

## 2024-01-29 MED ORDER — LIDOCAINE HCL 1 % IJ SOLN
5.0000 mL | INTRAMUSCULAR | Status: AC | PRN
  Administered 2024-01-26: 5 mL

## 2024-01-31 ENCOUNTER — Telehealth (HOSPITAL_COMMUNITY): Payer: Self-pay

## 2024-01-31 NOTE — Telephone Encounter (Signed)
 Called to confirm/remind patient of their appointment at the [facility] on 02/01/24.   Appointment:   [x] Confirmed  [] Left mess   [] No answer/No voice mail  [] VM Full/unable to leave message  [] Phone not in service  Patient reminded to bring all medications and/or complete list.  Confirmed patient has transportation. Gave directions, instructed to utilize valet parking.

## 2024-02-01 ENCOUNTER — Encounter (HOSPITAL_COMMUNITY): Payer: Self-pay

## 2024-02-01 ENCOUNTER — Ambulatory Visit (HOSPITAL_COMMUNITY)
Admission: RE | Admit: 2024-02-01 | Discharge: 2024-02-01 | Disposition: A | Discharge status: Home / Self Care | Source: Ambulatory Visit | Attending: Family Medicine

## 2024-02-01 VITALS — BP 118/62 | HR 65 | Ht 60.0 in | Wt 185.0 lb

## 2024-02-01 DIAGNOSIS — Z9981 Dependence on supplemental oxygen: Secondary | ICD-10-CM | POA: Insufficient documentation

## 2024-02-01 DIAGNOSIS — I480 Paroxysmal atrial fibrillation: Secondary | ICD-10-CM | POA: Diagnosis not present

## 2024-02-01 DIAGNOSIS — Z79899 Other long term (current) drug therapy: Secondary | ICD-10-CM | POA: Diagnosis not present

## 2024-02-01 DIAGNOSIS — E669 Obesity, unspecified: Secondary | ICD-10-CM

## 2024-02-01 DIAGNOSIS — J398 Other specified diseases of upper respiratory tract: Secondary | ICD-10-CM | POA: Diagnosis not present

## 2024-02-01 DIAGNOSIS — Z6836 Body mass index (BMI) 36.0-36.9, adult: Secondary | ICD-10-CM | POA: Diagnosis not present

## 2024-02-01 DIAGNOSIS — G4733 Obstructive sleep apnea (adult) (pediatric): Secondary | ICD-10-CM | POA: Insufficient documentation

## 2024-02-01 DIAGNOSIS — I2510 Atherosclerotic heart disease of native coronary artery without angina pectoris: Secondary | ICD-10-CM | POA: Insufficient documentation

## 2024-02-01 DIAGNOSIS — Z7901 Long term (current) use of anticoagulants: Secondary | ICD-10-CM | POA: Diagnosis not present

## 2024-02-01 DIAGNOSIS — N823 Fistula of vagina to large intestine: Secondary | ICD-10-CM | POA: Insufficient documentation

## 2024-02-01 DIAGNOSIS — I130 Hypertensive heart and chronic kidney disease with heart failure and stage 1 through stage 4 chronic kidney disease, or unspecified chronic kidney disease: Secondary | ICD-10-CM | POA: Diagnosis not present

## 2024-02-01 DIAGNOSIS — I428 Other cardiomyopathies: Secondary | ICD-10-CM | POA: Diagnosis not present

## 2024-02-01 DIAGNOSIS — J9611 Chronic respiratory failure with hypoxia: Secondary | ICD-10-CM | POA: Insufficient documentation

## 2024-02-01 DIAGNOSIS — I5022 Chronic systolic (congestive) heart failure: Secondary | ICD-10-CM | POA: Insufficient documentation

## 2024-02-01 DIAGNOSIS — J8489 Other specified interstitial pulmonary diseases: Secondary | ICD-10-CM | POA: Diagnosis not present

## 2024-02-01 DIAGNOSIS — N189 Chronic kidney disease, unspecified: Secondary | ICD-10-CM | POA: Diagnosis not present

## 2024-02-01 DIAGNOSIS — I4811 Longstanding persistent atrial fibrillation: Secondary | ICD-10-CM | POA: Insufficient documentation

## 2024-02-01 DIAGNOSIS — Z87891 Personal history of nicotine dependence: Secondary | ICD-10-CM | POA: Insufficient documentation

## 2024-02-01 LAB — BRAIN NATRIURETIC PEPTIDE: B Natriuretic Peptide: 65.5 pg/mL (ref 0.0–100.0)

## 2024-02-01 LAB — BASIC METABOLIC PANEL WITH GFR
Anion gap: 12 (ref 5–15)
BUN: 25 mg/dL — ABNORMAL HIGH (ref 8–23)
CO2: 26 mmol/L (ref 22–32)
Calcium: 10.1 mg/dL (ref 8.9–10.3)
Chloride: 102 mmol/L (ref 98–111)
Creatinine, Ser: 0.78 mg/dL (ref 0.44–1.00)
GFR, Estimated: >60 mL/min (ref >60)
Glucose, Bld: 107 mg/dL — ABNORMAL HIGH (ref 70–99)
Potassium: 3.7 mmol/L (ref 3.5–5.1)
Sodium: 140 mmol/L (ref 135–145)

## 2024-02-01 NOTE — Patient Instructions (Addendum)
 Thank you for coming in today  If you had labs drawn today, any labs that are abnormal the clinic will call you No news is good news  Medications: No changes  Follow up appointments:  Your physician recommends that you schedule a follow-up appointment in:  4 months With Dr. Cherrie Please call our office to schedule the follow-up appointment in October 2025 for December 2025.    Do the following things EVERYDAY: Weigh yourself in the morning before breakfast. Write it down and keep it in a log. Take your medicines as prescribed Eat low salt foods--Limit salt (sodium) to 2000 mg per day.  Stay as active as you can everyday Limit all fluids for the day to less than 2 liters   At the [facility], you and your health needs are our priority. As part of our continuing mission to provide you with exceptional heart care, we have created designated Provider Care Teams. These Care Teams include your primary Cardiologist (physician) and Advanced Practice Providers (APPs- Physician Assistants and Nurse Practitioners) who all work together to provide you with the care you need, when you need it.   You may see any of the following providers on your designated Care Team at your next follow up: Dr Toribio Cherrie Dr Ezra Shuck Dr. Ria Gardenia Greig Lenetta, NP Caffie Shed, [place] [facility] [place], [place] Beckey Coe, NP Tinnie Redman, PharmD   Please be sure to bring in all your medications bottles to every appointment.    Thank you for choosing [facility]  If you have any questions or concerns before your next appointment please send us  a message through [place] or call our office at [phone].    TO LEAVE A MESSAGE FOR THE NURSE SELECT OPTION 2, PLEASE LEAVE A MESSAGE INCLUDING: YOUR NAME DATE OF BIRTH CALL BACK NUMBER REASON FOR CALL**this is important as we prioritize the call backs  YOU WILL RECEIVE  A CALL BACK THE SAME DAY AS LONG AS YOU CALL BEFORE 4:00 PM

## 2024-02-01 NOTE — Progress Notes (Signed)
 ADVANCED HF CLINIC NOTE  Primary Care: Bensimhon, Toribio SAUNDERS, MD Primary Cardiologist: Dr. Orlin ([facility]) HF Cardiologist: Dr. Cherrie  HPI: Kathleen [name] is a 78 y.o. 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 Recently moved from [place] to [place] and referred by Dr. Orlin to establish cardiology care.   Previous echo with EF 40% but EF recovered. Echo ([facility]) 8/22 EF > 55% RV normal   She has been followed by Dr. Rosine and Orlin at [facility]. Has maintained NSR on Tikosyn .   RHC + LHC (01/17/20): RA 12, PA mean 29, PCWP 13, CI 2.2, Coronary angiogram without any stenosis  RHC (02/08/20): RA 8, PAM 24, wedge 16, CI 2.5  Now followed by Dr. Kara in Pulmonary for her BOOP.    Established care in HF Clinic in 9/22.  Zio 11/22: Predominantly AF with v-pacing with periods of sinus rhythm with V-pacing - avg HR of 75 bpm. 2. 2. Rare PACs and PVCs.   Seen in AF clinic 3/23, continued on dofetilide  250 bid.  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    Seen in ED 01/19/24 with recurrent syncope. HsTroponin unremarkable, ECG nonischemic, and orthostatics normal. Given IVF and felt better. Concern for possible UTI and discussed admission however patient opted for discharge home.  Today she returns for HF follow up. Overall feeling fair. She has increased her oxygen  to 4.5L as she feels she just cannot get a full breath in. Normally wears 4L oxygen . Follows with Dr. Jude. She walks with a rolling walker, able to walk short distances on flat ground if she takes her time. She remains in independent living facility [facility]). Felt palps yesterday. Denies abnormal bleeding,  CP, dizziness, edema, or PND/Orthopnea. Appetite ok. Weight at home 182 pounds. Taking all medications. Not wearing BiPap.  Cardiac Studies - Echo 6/25: EF 45-50%, mild LVH, normal RV, Mild MR  - Echo 4/24 EF 35-40% RV ok  - Echo (10/23) and (11/23) EF 35-40% (Dr. Cherrie felt 40-45%). Having episodes of CP that can wake her up from sleep. No exertional CP.   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     cyanocobalamin  (  VITAMIN B12) 1000 MCG tablet Take 1,000 mcg by mouth daily.     docusate sodium  (COLACE) 100 MG capsule Take 100 mg by mouth daily.     dofetilide  (TIKOSYN ) 250 MCG capsule Take 1 capsule (250 mcg total) by mouth in the morning and at bedtime. 60 capsule 6   ELIQUIS  5 MG TABS tablet Take 1 tablet (5 mg total) by mouth 2 (two) times daily. 60 tablet 11   febuxostat  (ULORIC ) 40 MG tablet Take 1 tablet (40 mg total) by mouth daily. 90 tablet 1   fluticasone  furoate-vilanterol (BREO ELLIPTA ) 100-25 MCG/ACT AEPB INHALE 1 PUFF INTO THE LUNGS DAILY 60 each 11   gabapentin   (NEURONTIN ) 300 MG capsule Take 1 capsule (300 mg total) by mouth at bedtime. 90 capsule 3   ketoconazole  (NIZORAL ) 2 % cream Apply 1 Application topically 2 (two) times daily. (Patient taking differently: Apply 1 Application topically as needed.) 60 g 2   levalbuterol  (XOPENEX ) 0.63 MG/3ML nebulizer solution Take 3 mLs (0.63 mg total) by nebulization every 6 (six) hours as needed for wheezing or shortness of breath. DX J44.89 J96.21 3 mL 12   Magnesium  500 MG TABS Take 500 mg by mouth every evening.     meclizine  (ANTIVERT ) 12.5 MG tablet Take 1 tablet (12.5 mg total) by mouth 3 (three) times daily as needed for dizziness. 30 tablet 0   mupirocin  ointment (BACTROBAN ) 2 % Apply 1 Application topically 2 (two) times daily. 30 g 3   naloxone  (NARCAN ) nasal spray 4 mg/0.1 mL Place 1 spray into the nose once as needed (opioid overdose).     oxyCODONE -acetaminophen  (PERCOCET/ROXICET) 5-325 MG tablet Take 1 tablet by mouth every 8 (eight) hours as needed for moderate pain (pain score 4-6).     OXYGEN  Inhale 4 L into the lungs continuous. Use with resmed ventilator     potassium chloride  (KLOR-CON ) 10 MEQ tablet Take 4 tablets (40 mEq total) by mouth daily. 360 tablet 2   revefenacin  (YUPELRI ) 175 MCG/3ML nebulizer solution Take 175 mcg by nebulization daily.     spironolactone  (ALDACTONE ) 25 MG tablet Take 1 tablet (25 mg total) by mouth every evening. (Patient taking differently: Take 12.5 mg by mouth every evening.) 90 tablet 3   torsemide  (DEMADEX ) 20 MG tablet Take 2 tablets (40 mg total) by mouth daily. (Patient taking differently: Take 20 mg by mouth daily.) 180 tablet 3   triamcinolone  cream (KENALOG ) 0.1 % Apply 1 Application topically 2 (two) times daily. 45 g 0   No current facility-administered medications for this encounter.   Allergies  Allergen Reactions   Allopurinol Other (See Comments)    Reaction:  Dizziness    Clindamycin Anaphylaxis and Hives   Flublok [Influenza Vaccine  Recombinant] Other (See Comments)    Fever 103 with no alternative explanation day after vaccine.  Darice Highfill FNP-C   Pneumococcal 13-Val Conj Vacc Itching, Swelling and Rash   Dronedarone Rash   Montelukast  Other (See Comments)    Makes her loopy.   Brovana  [Arformoterol ]     Caused muscle pain   Budesonide      Caused extreme joint pain   Entresto  [Sacubitril -Valsartan ] Other (See Comments)    hypotension   Fosamax [Alendronate Sodium] Nausea Only   Jardiance  [Empagliflozin ]     Caused a vaginal infection   Meperidine Nausea And Vomiting   Microplegia Msa-Msg [Plegisol]     Loopy,diarrhea   Rosuvastatin Other (See Comments)    Reaction:  Muscle spasms  Tetracycline Hives   Adhesive [Tape] Rash   Lovastatin Rash and Other (See Comments)    Muscle Pain   Social History   Socioeconomic History   Marital status: Widowed    Spouse name: Not on file   Number of children: 1   Years of education: Not on file   Highest education level: Not on file  Occupational History   Occupation: retired  Tobacco Use   Smoking status: Former    Current packs/day: 0.00    Average packs/day: 1 pack/day for 20.0 years (20.0 ttl pk-yrs)    Types: Cigarettes    Start date: 06/09/1967    Quit date: 06/09/1987    Years since quitting: 36.6    Passive exposure: Past   Smokeless tobacco: Never   Tobacco comments:    Former smoker 12/25/21  Vaping Use   Vaping status: Never Used  Substance and Sexual Activity   Alcohol use: No    Alcohol/week: 0.0 standard drinks of alcohol   Drug use: No   Sexual activity: Not Currently  Other Topics Concern   Not on file  Social History Narrative   Pt lives in [place] alone.  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  Her son works in [employer].   Social Drivers of Corporate investment banker Strain: Low Risk  (09/15/2023)   Overall Financial Resource Strain (CARDIA)    Difficulty of Paying Living Expenses: Not hard at all  Food  Insecurity: No Food Insecurity (12/01/2023)   Hunger Vital Sign    Worried About Running Out of Food in the Last Year: Never true    Ran Out of Food in the Last Year: Never true  Transportation Needs: No Transportation Needs (12/01/2023)   PRAPARE - Administrator, Civil Service (Medical): No    Lack of Transportation (Non-Medical): No  Physical Activity: Insufficiently Active (09/15/2023)   Exercise Vital Sign    Days of Exercise per Week: 2 days    Minutes of Exercise per Session: 60 min  Stress: Stress Concern Present (09/15/2023)   [employer] of Occupational Health - Occupational Stress Questionnaire    Feeling of Stress : To some extent  Social Connections: Socially Isolated (11/26/2023)   Social Connection and Isolation Panel    Frequency of Communication with Friends and Family: More than three times a week    Frequency of Social Gatherings with Friends and Family: Once a week    Attends Religious Services: Never    Database administrator or Organizations: No    Attends Banker Meetings: Never    Marital Status: Widowed  Intimate Partner Violence: Not At Risk (12/01/2023)   Humiliation, Afraid, Rape, and Kick questionnaire    Fear of Current or Ex-Partner: No    Emotionally Abused: No    Physically Abused: No    Sexually Abused: No   Family History  Problem Relation Age of Onset   Breast cancer Mother 57       3 different times   Pancreatic cancer Mother    Emphysema Father    Healthy Brother    Breast cancer Cousin    Valvular heart disease Son    Obesity Daughter    Colon cancer Neg Hx    Esophageal cancer Neg Hx    Stomach cancer Neg Hx    Inflammatory bowel disease Neg Hx    Liver disease Neg Hx    Rectal cancer Neg Hx  BP 118/62   Pulse 65   Ht 5' (1.524 m)   Wt 83.9 kg (185 lb)   SpO2 95% Comment: 4 Ls of o2  BMI 36.13 kg/m   Wt Readings from Last 3 Encounters:  02/01/24 83.9 kg (185 lb)  01/25/24 85.6 kg (188 lb 11.2 oz)   01/19/24 83 kg (183 lb)   PHYSICAL EXAM: General:  NAD. No resp difficulty, walked into clinic with RW on oxygen  HEENT: Normal Neck: Supple. No JVD. Thick neck Cor: Regular rate & rhythm. No rubs, gallops or murmurs. Lungs: Diminished all lobes Abdomen: Soft, obese, nontender, nondistended. + ostomy & hernia Extremities: No cyanosis, clubbing, rash, edema Neuro: Alert & oriented x 3, moves all 4 extremities w/o difficulty. Affect pleasant.  MDT Device interrogation (personally reviewed): Thoracic impedence back to baseline, no AT/AF, 0.8 hr/day activity, 100% VP  ASSESSMENT & PLAN:  1. 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 RV ok  - Echo (10/23 to 4/24) EF 35-40%.  - R/L cath (10/23):  minimal CAD.  - Echo 6/25: EF 45-50%, RV nl, mild MR - Chronic NYHA IIIb, functional class multifactorial due to resp failure and deconditioning. Volume improving, weight down and OptiVol stable.  - Continue torsemide  20 mg daily + 40 KCL daily. - Continue spiro 12.5 mg daily. - no ? blocker with severe lung disease. - no SGLT2i with frequent UTIs. - Last Cardiomems reading 10/23 but pillow is not working. Will work on getting her a new pillow - Labs today.  2. 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 Jude - Continue O2. No change  3. Persistent AF - s/p AVN ablation and PM (failed upgrade to BiV) - Followed by EP/AF Clinic - Continue Tikosyn , per EP - Continue Eliquis  5 bid. N0 bleeding issues.  4. Morbid obesity  - Body mass index is 36.13 kg/m. - weight down after surgery  5. OSA - Unable to configure BiPap tubing at facility - I asked her to ask her son to help her set up in her room  6. H/o ostomy 11/24 - c/b colovaginal fistula - no change  Follow up in 4 months with Dr. Cherrie  Kathleen CHRISTELLA Gainer, FNP  3:18 PM

## 2024-02-02 ENCOUNTER — Ambulatory Visit (HOSPITAL_COMMUNITY): Payer: Self-pay | Admitting: Family Medicine

## 2024-02-03 ENCOUNTER — Telehealth: Payer: Self-pay

## 2024-02-03 NOTE — Telephone Encounter (Signed)
 Copied from CRM [id number]. Topic: General - Other >> Feb 02, 2024  3:21 PM [place] E wrote: Reason for CRM: Patient stated that she missed a call from Dr. Joshua' office, patient thought she would be receiving a call from the ostomy clinic but has not heard anything from them. Callback number for patient is [phone] to clarify.

## 2024-02-08 ENCOUNTER — Ambulatory Visit (INDEPENDENT_AMBULATORY_CARE_PROVIDER_SITE_OTHER)

## 2024-02-08 ENCOUNTER — Encounter (HOSPITAL_COMMUNITY): Payer: Self-pay | Admitting: Physician Assistant

## 2024-02-08 ENCOUNTER — Ambulatory Visit (HOSPITAL_COMMUNITY)
Admission: RE | Admit: 2024-02-08 | Discharge: 2024-02-08 | Disposition: A | Discharge status: Home / Self Care | Source: Ambulatory Visit | Attending: Physician Assistant | Admitting: Physician Assistant

## 2024-02-08 VITALS — BP 118/68 | HR 73 | Ht 60.0 in | Wt 186.6 lb

## 2024-02-08 DIAGNOSIS — Z79899 Other long term (current) drug therapy: Secondary | ICD-10-CM | POA: Diagnosis not present

## 2024-02-08 DIAGNOSIS — I5022 Chronic systolic (congestive) heart failure: Secondary | ICD-10-CM | POA: Diagnosis present

## 2024-02-08 DIAGNOSIS — D6869 Other thrombophilia: Secondary | ICD-10-CM | POA: Diagnosis present

## 2024-02-08 DIAGNOSIS — Z950 Presence of cardiac pacemaker: Secondary | ICD-10-CM | POA: Diagnosis present

## 2024-02-08 DIAGNOSIS — Z5181 Encounter for therapeutic drug level monitoring: Secondary | ICD-10-CM | POA: Diagnosis not present

## 2024-02-08 DIAGNOSIS — D6859 Other primary thrombophilia: Secondary | ICD-10-CM | POA: Diagnosis present

## 2024-02-08 DIAGNOSIS — I4811 Longstanding persistent atrial fibrillation: Secondary | ICD-10-CM | POA: Diagnosis not present

## 2024-02-08 DIAGNOSIS — I4819 Other persistent atrial fibrillation: Secondary | ICD-10-CM | POA: Diagnosis present

## 2024-02-08 NOTE — Progress Notes (Signed)
 Primary Care Physician: Joshua Debby CROME, MD Referring Physician:  Dr. Kelsie  Prior EP: Dr. Kelsie AHF: Dr.Benshimon   Kathleen [name] is a 78 y.o. female with a h/o  obesity, BOOP, COPD, persistent  afib, and CHB s/p AV nodal ablation and PPM (MDT) by Dr Pierrette who presents today to establish care in the Electrophysiology device clinic  Per Dr. Merrick note of 05/2021,   She has had a challenging CV history.  She says that she was diagnosed with atrial fibrillation in 2014.  She took multaq but developed a rash.  She was placed on tikosyn  but continued to have afib.  She appears to have had PVI + CTI by Dr Pierrette 2/15.  She states that this took 9 hours and was not successful.  She continued to have atrial arrhythmias.  She underwent AV nodal ablation and pacemaker implantation 11/02/15.  LV lead could not be placed however she received a CRT-P device.  Per Dr Laural recent note, it appears that the lead was not placed due to high LV lead thresholds.  She had return of her AV nodal conduction and required repeat AV nodal ablation by Dr Rosine the following day.  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  She returned to EP lab and had the device placed submuscular Sept 2015. Per Dr Rachele note (8/22), she had redo ablation 2019.    She was started on tikosyn  3/22 with improvement in her AF burden.The patient also had left bundle lead placed 05/09/2020.  She was hospitalized  for sepsis due to extensive chest wall cellulitis 12/22 at [facility]. She no longer is followed at [facility] as she moved out of the area.   She is in the afib clinic for Tikosyn  surveillance. She is a sensed/v paced. She has not had any awareness of afib. She continues on tikosyn  250 mcg bid. No issues with anticoagulation.     She was placed on Tikosyn  possibly last March 2022 at [facility]. She struggles with chronic lung disease. She is on CPAP and continuous O2 via  nasal cannula at 4 L /min  F/u in afib clinic 12/26/22 for Tikosyn  surveillance. She is av paced. She reports that she had a fall with head trauma end of April with subarachnoid hemorrhage that resolved holding xarelto  that  did not require reversal of xarelto . She also that TSH indicated hyperthyroidism at .20 and her replacement was stopped but now feels her symptoms of hypothyroidism have returned. Asking for a TSH to be drawn today. Last paceart report did not show any significant afib burden.   Follow up in the AF clinic 11/18/22. Patient reports that she has been feeling much more fatigued over the past several weeks. She has seen Dr Jude and has been on two rounds of antibiotics, felt to be acute bronchitis. She has finished Augmentin  but does not feel better. She had a subjective fever two days ago. Optivol on her PPM shows normal fluid levels. PPM also shows 0% afib burden.   Follow up 02/08/24. Patient returns for follow up for atrial fibrillation and dofetilide  monitoring. She is in SR today. Her last device interrogation only showed 8 minutes of afib. No bleeding issues on anticoagulation.   Today, she  denies symptoms of palpitations, chest pain, shortness of breath, orthopnea, PND, lower extremity edema, dizziness, presyncope, syncope, bleeding, or neurologic sequela. The patient is tolerating medications without difficulties and is otherwise without complaint  today.    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     cyanocobalamin  (VITAMIN B12) 1000 MCG tablet Take 1,000 mcg by mouth daily.     docusate sodium  (COLACE) 100 MG capsule Take 100 mg by mouth daily.     dofetilide  (TIKOSYN ) 250 MCG capsule Take 1 capsule (250 mcg total) by mouth in the morning and at bedtime. 60 capsule 6   ELIQUIS  5 MG TABS tablet Take 1 tablet (5 mg total) by mouth 2 (two) times daily. 60 tablet 11   febuxostat  (ULORIC ) 40 MG tablet Take 1 tablet (40 mg total) by mouth daily. 90 tablet 1   fluticasone  furoate-vilanterol (BREO ELLIPTA ) 100-25 MCG/ACT AEPB INHALE 1 PUFF INTO THE LUNGS DAILY 60 each 11   gabapentin  (NEURONTIN ) 300 MG capsule Take 1 capsule (300 mg total) by mouth at bedtime. 90 capsule 3   ketoconazole  (NIZORAL ) 2 % cream Apply 1 Application topically 2 (two) times daily. (Patient taking differently: Apply 1 Application topically as needed.) 60 g 2   levalbuterol  (XOPENEX ) 0.63 MG/3ML nebulizer solution Take 3 mLs (0.63 mg total) by nebulization every 6 (six) hours as needed for wheezing or shortness of breath. DX J44.89 J96.21 3 mL 12   Magnesium  500 MG TABS Take 500 mg by mouth every evening.     mupirocin  ointment (BACTROBAN ) 2 % Apply 1 Application topically 2 (two) times daily. 30 g 3   naloxone  (NARCAN ) nasal spray 4 mg/0.1 mL Place 1 spray into the nose once as needed (opioid overdose).     oxyCODONE -acetaminophen  (PERCOCET/ROXICET) 5-325 MG tablet Take 1 tablet by mouth every 8 (eight) hours as needed for moderate pain (pain score 4-6).     OXYGEN  Inhale 4 L into the lungs continuous. Use with resmed ventilator     potassium chloride   (KLOR-CON ) 10 MEQ tablet Take 4 tablets (40 mEq total) by mouth daily. 360 tablet 2   revefenacin  (YUPELRI ) 175 MCG/3ML nebulizer solution Take 175 mcg by nebulization daily.     spironolactone  (ALDACTONE ) 25 MG tablet Take 1 tablet (25 mg total) by mouth every evening. (Patient taking differently: Take 12.5 mg by mouth every evening.) 90 tablet 3   torsemide  (DEMADEX ) 20 MG tablet Take 2 tablets (40 mg total) by mouth daily. (Patient taking differently: Take 20 mg by mouth daily.) 180 tablet 3   triamcinolone  cream (KENALOG ) 0.1 % Apply 1 Application topically 2 (two) times daily. 45 g 0   No current facility-administered medications for this encounter.   ROS-  All systems are reviewed and negative except as per the HPI above  Physical Exam: Vitals:   02/08/24 1325  BP: 118/68  Pulse: 73  Weight: 84.6 kg  Height: 5' (1.524 m)     Wt Readings from Last 3 Encounters:  02/08/24 84.6 kg  02/01/24 83.9 kg  01/25/24 85.6 kg    GEN: Well nourished, well developed in no acute distress CARDIAC: Regular rate and rhythm, no murmurs, rubs, gallops RESPIRATORY:  Clear to auscultation without rales, wheezing or rhonchi, nasal canula O2 ABDOMEN: Soft, non-tender, non-distended EXTREMITIES:  No edema; No deformity    EKG today demonstrates AV dual paced rhythm Vent. rate 73 BPM PR interval 182 ms QRS duration 146 ms QT/QTcB 426/469 ms   Echo 11/28/23  1. Left ventricular ejection fraction, by estimation, is 45 to 50%. The  left ventricle has mildly decreased function. The left ventricle  demonstrates global hypokinesis. There is mild left ventricular  hypertrophy. Left ventricular diastolic parameters  were normal.   2. Right ventricular systolic function is normal. The right ventricular  size is mildly enlarged. There is normal pulmonary artery systolic  pressure. The estimated right ventricular systolic pressure is 32.6 mmHg.   3. The mitral valve is normal in structure. Mild  mitral valve  regurgitation. No evidence of mitral stenosis.   4. The aortic valve is calcified. There is moderate calcification of the  aortic valve. Aortic valve regurgitation is not visualized. Aortic valve  sclerosis is present, with no evidence of aortic valve stenosis. Aortic  valve mean gradient measures 8.0  mmHg. Aortic valve Vmax measures 2.08 m/s.   5. The inferior vena cava is normal in size with greater than 50%  respiratory variability, suggesting right atrial pressure of 3 mmHg.    CHA2DS2-VASc Score = 7  The patient's score is based upon: CHF History: 1 HTN History: 1 Diabetes History: 1 Stroke History: 0 Vascular Disease History: 1 Age Score: 2 Gender Score: 1       ASSESSMENT AND PLAN: Persistent Atrial Fibrillation (ICD10:  I48.19) The patient's CHA2DS2-VASc score is 7, indicating a 11.2% annual risk of stroke.   Patient maintaining SR Continue dofetilide  250 mcg BID Continue Eliquis  5 mg BID  Secondary Hypercoagulable State (ICD10:  D68.69) The patient is at significant risk for stroke/thromboembolism based upon her CHA2DS2-VASc Score of 7.  Continue Apixaban  (Eliquis ). No bleeding issues.   High Risk Medication Monitoring (ICD 10: U5195107) Patient requires ongoing monitoring for anti-arrhythmic medication which has the potential to cause life threatening arrhythmias. QT interval on ECG acceptable for dofetilide  monitoring. Check bmet/mag today.     Obesity Body mass index is 36.44 kg/m.  Encouraged lifestyle modification  HFmrEF EF 45-50% GDMT per [facility] team Fluid status appears stable today  HTN Stable on current regimen  CHB S/p AV nodal ablation at [facility] Followed by Dr Inocencio  OSA  Encouraged nightly BiPAP   Follow up with Dr Inocencio per recall. AF clinic in 6 months.    Daril Kicks PA-C Afib Clinic [facility] [address] [place], [place] [address] [phone]

## 2024-02-09 ENCOUNTER — Ambulatory Visit (HOSPITAL_COMMUNITY): Payer: Self-pay | Admitting: Physician Assistant

## 2024-02-09 LAB — BASIC METABOLIC PANEL WITH GFR
BUN/Creatinine Ratio: 21 (ref 12–28)
BUN: 15 mg/dL (ref 8–27)
CO2: 27 mmol/L (ref 20–29)
Calcium: 10.2 mg/dL (ref 8.7–10.3)
Chloride: 100 mmol/L (ref 96–106)
Creatinine, Ser: 0.73 mg/dL (ref 0.57–1.00)
Glucose: 111 mg/dL — ABNORMAL HIGH (ref 70–99)
Potassium: 4.2 mmol/L (ref 3.5–5.2)
Sodium: 141 mmol/L (ref 134–144)
eGFR: 85 mL/min/1.73 (ref >59)

## 2024-02-09 LAB — MAGNESIUM: Magnesium: 2.2 mg/dL (ref 1.6–2.3)

## 2024-02-10 NOTE — Progress Notes (Signed)
 EPIC Encounter for ICM Monitoring  Patient Name: Kathleen [name] is a 78 y.o. female Date: 02/10/2024 Primary Care Physican: Joshua Debby CROME, MD Primary Cardiologist: Bensimhon Electrophysiologist: Camnitz Bi-V Pacing: 100%          04/06/2023 Home Weight: 176 lbs    06/30/2023 Home Weight: 178 lbs 08/02/2023 Home Weight: 172 lbs 09/15/2023   Home Weight: 177 lbs 11/11/2023   Home Weight: 176 lbs 11/17/2023 Home Weight: 182 lbs 11/22/2023 Home Weight: 185 lbs 01/05/2024 Home Weight: 190 lbs  01/17/2024 Home Weight: 183 lbs 02/10/2024 Home Weight: 183 lbs   Since 01-Feb-2024 Time in AT/AF  0.0 hr/day (0.0%)                                 Spoke with patient and heart failure questions reviewed.  Transmission results reviewed.  Pt asymptomatic for fluid accumulation.  Reports feeling well at this time and voices no complaints.     Diet:  She lives in an assisted living  Eats foods that are prepared at the facility she lives in which are not low salt.     Optivol thoracic impedance suggesting intermittent days with possible fluid accumulation since 8/5.   Prescribed:  Torsemide  20 mg take 2 tablet(s) (40 mg total) by mouth daily.   Per 02/01/2024 HF clinic office note, pt to continue with Torsemide  20 mg daily. Potassium 10 mEq take 4 tablet(s) (40 mEq total) by mouth daily  Spironolactone  25 mg take 1 (25 mg total) tablet by mouth every evening   Labs: 02/08/2024 Creatinine 0.73, BUN 15, Potassium 4.2, Sodium 141, GFR 85  02/01/2024 Creatinine 0.78, BUN 25, Potassium 3.7, Sodium 140  01/19/2024 Creatinine 0.73, BUN 19, Potassium 3.5, Sodium 136  01/11/2024 Creatinine 0.66, BUN 17, Potassium 3.7, Sodium 139, GFR >60  01/10/2024 Creatinine 0.73, BUN 20, Potassium 4.3, Sodium 140, GFR >60  A complete set of results can be found in Results Review.   Recommendations:  No changes and encouraged to call if experiencing any fluid symptoms.   Follow-up plan: ICM clinic phone appointment on  03/20/2024.  91 day device clinic remote transmission 03/29/2024.     EP/Cardiology Office Visits:  Recall 04/19/2024 with Dr Inocencio.   Copy of ICM check sent to Dr. Inocencio.    3 month ICM trend: 02/07/2024.    12-14 Month ICM trend:     [name], RN 02/10/2024 7:44 AM

## 2024-02-10 NOTE — Telephone Encounter (Signed)
 Spoke with the ostomy clinic and they advised me that the patient has been cancelling her appointments. I asked them could they reach out to the patient and get her scheduled for an appointment they said they would.

## 2024-02-16 ENCOUNTER — Other Ambulatory Visit: Payer: Self-pay

## 2024-02-16 ENCOUNTER — Ambulatory Visit (INDEPENDENT_AMBULATORY_CARE_PROVIDER_SITE_OTHER): Admitting: Orthopedic Surgery

## 2024-02-16 ENCOUNTER — Encounter: Payer: Self-pay | Admitting: Orthopedic Surgery

## 2024-02-16 DIAGNOSIS — G8929 Other chronic pain: Secondary | ICD-10-CM | POA: Diagnosis not present

## 2024-02-16 DIAGNOSIS — M19011 Primary osteoarthritis, right shoulder: Secondary | ICD-10-CM | POA: Diagnosis not present

## 2024-02-16 DIAGNOSIS — M25511 Pain in right shoulder: Secondary | ICD-10-CM | POA: Diagnosis not present

## 2024-02-16 DIAGNOSIS — M19012 Primary osteoarthritis, left shoulder: Secondary | ICD-10-CM | POA: Diagnosis not present

## 2024-02-16 MED ORDER — METHYLPREDNISOLONE ACETATE 40 MG/ML IJ SUSP
40.0000 mg | INTRAMUSCULAR | Status: AC | PRN
  Administered 2024-02-16: 40 mg via INTRA_ARTICULAR

## 2024-02-16 MED ORDER — BUPIVACAINE HCL 0.5 % IJ SOLN
9.0000 mL | INTRAMUSCULAR | Status: AC | PRN
  Administered 2024-02-16: 9 mL via INTRA_ARTICULAR

## 2024-02-16 MED ORDER — LIDOCAINE HCL 1 % IJ SOLN
5.0000 mL | INTRAMUSCULAR | Status: AC | PRN
  Administered 2024-02-16: 5 mL

## 2024-02-16 NOTE — Progress Notes (Signed)
 Office Visit Note   Patient: Kathleen [name]           Date of Birth: [birth year]           MRN: [id number] Visit Date: 02/16/2024 Requested by: Cherrie Toribio SAUNDERS, MD [address] [place],  [place] [address] PCP: Joshua Debby CROME, MD  Subjective: Chief Complaint  Patient presents with   Other    Right shoulder GH injection planned    HPI: Kathleen [name] is a 78 y.o. female who presents to the office reporting right shoulder pain.  She did well with her left shoulder injection several weeks ago.  Would like to get the right shoulder injected today.  Has known end-stage right shoulder arthritis.  Not an operative candidate because of medical issues..                ROS: All systems reviewed are negative as they relate to the chief complaint within the history of present illness.  Patient denies fevers or chills.  Assessment & Plan: Visit Diagnoses:  1. Chronic right shoulder pain     Plan: Impression is left shoulder arthritis and right shoulder arthritis.  Right shoulder injected today under ultrasound guidance.  We could do 2 or maximum 3 injections/year for joint depending on how well she responds.  Follow-up with us  as needed.  Follow-Up Instructions: No follow-ups on file.   Orders:  Orders Placed This Encounter  Procedures   US  Guided Needle Placement - No Linked Charges   No orders of the defined types were placed in this encounter.     Procedures: Large Joint Inj: R glenohumeral on 02/16/2024 6:10 PM Indications: diagnostic evaluation and pain Details: 22 G 3.5 in needle, ultrasound-guided posterior approach  Arthrogram: No  Medications: 9 mL bupivacaine  0.5 %; 40 mg methylPREDNISolone  acetate 40 MG/ML; 5 mL lidocaine  1 % Outcome: tolerated well, no immediate complications Procedure, treatment alternatives, risks and benefits explained, specific risks discussed. Consent was given by the patient. Immediately prior to procedure a time out was called to  verify the correct patient, procedure, equipment, support staff and site/side marked as required. Patient was prepped and draped in the usual sterile fashion.       Clinical Data: No additional findings.  Objective: Vital Signs: There were no vitals taken for this visit.  Physical Exam:  Constitutional: Patient appears well-developed HEENT:  Head: Normocephalic Eyes:EOM are normal Neck: Normal range of motion Cardiovascular: Normal rate Pulmonary/chest: Effort normal Neurologic: Patient is alert Skin: Skin is warm Psychiatric: Patient has normal mood and affect  Ortho Exam: Right shoulder exam unchanged.  Patient has painful range of motion with some coarseness with passive range of motion.  Deltoid fires.  Patient has diminished strength with internal and external rotation.  Motor or sensory function to the hand is intact.  Specialty Comments:  No specialty comments available.  Imaging: No results found.   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[facility]) 11/26/2023   History of diverticulitis 11/26/2023   Tinea corporis 10/21/2023   Surgical wound, non healing 09/10/2023  Colostomy complication (HCC) 08/18/2023   Irritant contact dermatitis associated with fecal stoma 08/13/2023   Colostomy care (HCC) 08/13/2023   Wound infection 08/04/2023   Dyslipidemia 08/03/2023   Candida esophagitis (HCC) 05/24/2023   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   Pill dysphagia 04/06/2023   COPD exacerbation (HCC) 03/26/2023   Absolute  anemia 03/17/2023   Chronic nausea 03/17/2023   Hypokalemia 02/23/2023   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   Carpal tunnel syndrome, right upper limb 01/27/2023   Hypercoagulable state (HCC) 11/18/2022   COPD with acute exacerbation (HCC) 10/09/2022   Chronic obstructive pulmonary disease (HCC) 06/28/2022   Palliative care encounter 06/28/2022   Anticoagulated by anticoagulation treatment 06/11/2022   Supplemental oxygen  dependent 06/11/2022   Heart failure with reduced ejection fraction (HCC) 05/14/2022   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   Depression, major, single episode, moderate (HCC) 04/27/2022   Obesity (BMI 30-39.9) 03/18/2022   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   Low TSH level 12/02/2021   Acquired hypothyroidism 11/12/2021   Encounter for palliative care involving management of pain 11/12/2021   Chronic left-sided low back pain 11/04/2021   Gout 10/01/2021   Hyperlipidemia with target LDL less than 100 08/07/2021   Chronic anticoagulation 07/07/2021   Stage 3a chronic kidney disease (HCC) 05/23/2021   Dietary iron deficiency 05/23/2021   Age-related osteoporosis without current pathological fracture 04/22/2021   Type 2 diabetes mellitus with diabetic neuropathy, without long-term current use of insulin  (HCC) 04/22/2021   Sleep apnea 01/22/2021   Physical deconditioning 01/16/2020   Statin intolerance 10/12/2019   Statin myopathy 02/24/2019   Chronic combined systolic and diastolic heart failure (HCC) 04/06/2018   HTN (hypertension) 04/06/2018   COPD (chronic obstructive pulmonary disease) (HCC) 04/06/2018   Persistent atrial fibrillation (HCC) 04/06/2018   Chronic respiratory failure with hypoxia (HCC) 03/28/2018   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 12/02/2015   Protein-calorie malnutrition, moderate (HCC) 09/11/2015   Past Medical History:  Diagnosis Date   Arthritis    Asthma    Blood transfusion without reported diagnosis 2015   in  hospital   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HCC)    Cataract 2016   very small   CHF (congestive heart failure) (HCC)    Chronic renal insufficiency    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    Concussion 10/04/2021   COPD (chronic obstructive pulmonary disease) (HCC)    Diverticulitis    Emphysema of lung (HCC) 2013   lung collapsed   Gout    Hypertension    Longstanding persistent atrial fibrillation (HCC)    on Xarelto    Nonischemic cardiomyopathy (HCC)    Obesity    Oxygen  deficiency 2013   Pacemaker    Spontaneous pneumothorax 2013   Thyroid  disease 1990    Family History  Problem Relation Age of Onset   Breast cancer Mother 69       3 different times   Pancreatic cancer Mother    Emphysema Father    Healthy Brother    Breast cancer Cousin    Valvular heart disease Son    Obesity Daughter    Colon cancer Neg Hx    Esophageal cancer Neg Hx    Stomach cancer Neg Hx    Inflammatory bowel disease Neg Hx    Liver disease Neg Hx    Rectal cancer Neg Hx     Past Surgical History:  Procedure Laterality Date   ABDOMINAL HYSTERECTOMY  APPENDECTOMY     APPLICATION OF WOUND VAC  04/27/2023   Procedure: APPLICATION OF WOUND VAC;  Surgeon: Ebbie Cough, MD;  Location: [facility] OR;  Service: General;;   ATRIAL FIBRILLATION ABLATION  07/20/2013   by Dr Pierrette   AV nodal ablation  11/01/2013   by Dr Pierrette, repeated by Dr Rosine   BREAST BIOPSY Bilateral 1997   negative   CARDIAC CATHETERIZATION     CESAREAN SECTION  1976   CHOLECYSTECTOMY     COLON RESECTION SIGMOID N/A 04/27/2023   Procedure: COLON RESECTION SIGMOID WITH COLOSTOMY;  Surgeon: Ebbie Cough, MD;  Location: [facility] OR;  Service: General;  Laterality: N/A;   COLON SURGERY  2024   ESOPHAGOGASTRODUODENOSCOPY (EGD) WITH PROPOFOL  N/A 05/24/2023   Procedure: ESOPHAGOGASTRODUODENOSCOPY (EGD) WITH PROPOFOL ;  Surgeon: Albertus Gordy HERO, MD;  Location: [facility] ENDOSCOPY;  Service:  Gastroenterology;  Laterality: N/A;   HERNIA REPAIR     LAPAROTOMY N/A 04/27/2023   Procedure: EXPLORATORY LAPAROTOMY;  Surgeon: Ebbie Cough, MD;  Location: [facility] OR;  Service: General;  Laterality: N/A;   PACEMAKER INSERTION  06/2017   MDT Viva CRT-P implanted by Dr Pierrette after AV nodal ablation,  LV lead could not be placed   PACEMAKER INSERTION  05/2020   with lead bundle   RIGHT/LEFT HEART CATH AND CORONARY ANGIOGRAPHY N/A 03/20/2022   Procedure: RIGHT/LEFT HEART CATH AND CORONARY ANGIOGRAPHY;  Surgeon: Swaziland, Peter M, MD;  Location: [facility] INVASIVE CV LAB;  Service: Cardiovascular;  Laterality: N/A;   TUBAL LIGATION  1976   Social History   Occupational History   Occupation: retired  Tobacco Use   Smoking status: Former    Current packs/day: 0.00    Average packs/day: 1 pack/day for 20.0 years (20.0 ttl pk-yrs)    Types: Cigarettes    Start date: 06/09/1967    Quit date: 06/09/1987    Years since quitting: 36.7    Passive exposure: Past   Smokeless tobacco: Never   Tobacco comments:    Former smoker 12/25/21  Vaping Use   Vaping status: Never Used  Substance and Sexual Activity   Alcohol use: No    Alcohol/week: 0.0 standard drinks of alcohol   Drug use: No   Sexual activity: Not Currently

## 2024-03-07 ENCOUNTER — Encounter: Payer: Self-pay | Admitting: Sports Medicine

## 2024-03-07 ENCOUNTER — Ambulatory Visit (INDEPENDENT_AMBULATORY_CARE_PROVIDER_SITE_OTHER): Admitting: Sports Medicine

## 2024-03-07 ENCOUNTER — Other Ambulatory Visit: Payer: Self-pay

## 2024-03-07 DIAGNOSIS — M7918 Myalgia, other site: Secondary | ICD-10-CM | POA: Diagnosis not present

## 2024-03-07 DIAGNOSIS — M7071 Other bursitis of hip, right hip: Secondary | ICD-10-CM

## 2024-03-07 DIAGNOSIS — M76899 Other specified enthesopathies of unspecified lower limb, excluding foot: Secondary | ICD-10-CM

## 2024-03-07 MED ORDER — BETAMETHASONE SOD PHOS & ACET 6 (3-3) MG/ML IJ SUSP
9.0000 mg | INTRAMUSCULAR | Status: AC | PRN
  Administered 2024-03-07: 9 mg via INTRA_ARTICULAR

## 2024-03-07 MED ORDER — LIDOCAINE HCL 1 % IJ SOLN
4.0000 mL | INTRAMUSCULAR | Status: AC | PRN
  Administered 2024-03-07: 4 mL

## 2024-03-07 NOTE — Progress Notes (Signed)
 Patient says that her pain has been especially bad over the last few days. She says that it tends to move around, but is always in the posterior hip/buttock and never comes around to the front. She is here for injection today.

## 2024-03-07 NOTE — Progress Notes (Signed)
 Office & Procedure Note  Patient: Kathleen [name]             Date of Birth: 04-09-1946           MRN: [id number]             Visit Date: 03/07/2024  YEP:Ejupzwu here today for follow-up chronic right posterior buttock pain/ischial bursa pain.  She was unable to undergo MRI given her pacemaker was not MRI compatible.   She would like to proceed with ultrasound-guided right ischial bursa injection.  Patient says that her pain has been especially bad over the last few days. She says that it tends to move around, but is always in the posterior hip/buttock, worse with prolonged sitting. Concern for at least partial hamstring tearing at origin on ischial tuberosity based on prior exams.  Patient with still like to proceed with injection today for pain relief and diagnostic/therapeutic purposes after thorough discussion of risks and benefits. For pain control, she is managed on Percocet 5-325 mg every 8 hours as needed.   PE:  + TTP directly over the right ischial bursa and proximal hamstring tendon origin.  There is weakness and poor activation with hamstring/hip extension.  Procedures: Visit Diagnoses:  1. Ischial bursitis of right side   2. Bilateral buttock pain   3. Hamstring tendinitis at origin    Large Joint Inj: R hip joint on 03/07/2024 11:09 PM Indications: pain and diagnostic evaluation Details: 22 G 3.5 in needle, ultrasound-guided posterior approach Medications: 4 mL lidocaine  1 %; 9 mg betamethasone  acetate-betamethasone  sodium phosphate  6 (3-3) MG/ML Outcome: tolerated well, no immediate complications  Procedure: US -guided ischial bursa injection, right posterior hip After discussion on risks/benefits/indications and informed verbal consent was obtained, a timeout was performed. Patient was lying prone on exam table. The posterior hip and right buttock was cleaned with ChloraPrep and multiple alcohol swabs. Using ultrasound guidance, the ischial tuberosity and overlying  ischial bursa was identified in a long-axis view. The overlying soft tissue was anesthetized with 5 cc of lidocaine  1% only.  Then, utilizing ultrasound guidance via an in-plane approach the needle was advanced into the ischial bursa and subsequently injected with 4:1.5 lidocaine :celestone  with ultrasound visualization of the injectate administered into the bursa. Patient tolerated procedure well. Procedure, treatment alternatives, risks and benefits explained, specific risks discussed. Consent was given by the patient. Immediately prior to procedure a time out was called to verify the correct patient, procedure, equipment, support staff and site/side marked as required. Patient was prepped and draped in the usual sterile fashion.     *US  scan and soft tissue numbing performed with Dr. Prentice Agent, MD  Plan:  - Discussed with Kathleen [name] that based on our bedside ultrasound, imaging is suspicious for at least partial tearing (may be high-grade) of the conjoined tendon of the insertional hamstrings.  There is evidence of reciprocal ischial bursitis. - Through shared decision making, we did proceed with ultrasound-guided right ischial bursa injection of the posterior hip.  Advised on postinjection protocol.  May use ice as well as Tylenol  in the short-term. - Discussed with Kathleen [name] if this is more bursitis, this should help her pain for both the short and long-term, however if there is notable high-grade hamstring tearing, this may not be effective in the long-term.  She can notify me via MyChart over the next month what degree and duration of relief she receives. - She may continue her chronic Percocet 5-325 mg every 8 hours as  needed.  - Follow-up with me as needed  Lonell Sprang, DO Primary Care Sports Medicine Physician  [facility] - Orthopedics  This note was dictated using Dragon naturally speaking software and may contain errors in syntax, spelling, or content which have not been identified  prior to signing this note.

## 2024-03-09 NOTE — Progress Notes (Addendum)
 Remote PPM Transmission

## 2024-03-14 ENCOUNTER — Other Ambulatory Visit (HOSPITAL_COMMUNITY): Payer: Self-pay | Admitting: Cardiology

## 2024-03-19 ENCOUNTER — Encounter (HOSPITAL_BASED_OUTPATIENT_CLINIC_OR_DEPARTMENT_OTHER): Payer: Self-pay | Admitting: Pulmonary Disease

## 2024-03-20 ENCOUNTER — Ambulatory Visit: Attending: Cardiology

## 2024-03-20 DIAGNOSIS — I5022 Chronic systolic (congestive) heart failure: Secondary | ICD-10-CM | POA: Diagnosis not present

## 2024-03-20 DIAGNOSIS — Z950 Presence of cardiac pacemaker: Secondary | ICD-10-CM

## 2024-03-20 NOTE — Telephone Encounter (Signed)
**Note De-identified  [place] Obfuscation** Please advise 

## 2024-03-22 ENCOUNTER — Ambulatory Visit: Admitting: Gastroenterology

## 2024-03-22 NOTE — Telephone Encounter (Signed)
 Please reach out to pt to schedule I have notified via My chart

## 2024-03-22 NOTE — Progress Notes (Signed)
 EPIC Encounter for ICM Monitoring  Patient Name: Kathleen [name] is a 78 y.o. female Date: 03/22/2024 Primary Care Physican: Joshua Debby CROME, MD Primary Cardiologist: Bensimhon Electrophysiologist: Camnitz Bi-V Pacing: 100%          04/06/2023 Home Weight: 176 lbs    06/30/2023 Home Weight: 178 lbs 08/02/2023 Home Weight: 172 lbs 09/15/2023   Home Weight: 177 lbs 11/11/2023   Home Weight: 176 lbs 11/17/2023 Home Weight: 182 lbs 11/22/2023 Home Weight: 185 lbs 01/05/2024 Home Weight: 190 lbs  01/17/2024 Home Weight: 183 lbs 02/10/2024 Home Weight: 183 lbs 03/22/2024 Home Weight: 183-184 lbs   Since 07-Feb-2024 Time in AT/AF  0.0 hr/day (0.0%)                                 Spoke with patient and heart failure questions reviewed.  Transmission results reviewed.  Pt asymptomatic for fluid accumulation.  Reports feeling well at this time and voices no complaints.      Diet:  Lives in an assisted living and meals are provided which are not low salt.     Since 02/08/2024 ICM Remote Transmission: Optivol thoracic impedance suggesting normal fluid levels with the exception of possible fluid accumulation from 02/13/2024-02/20/2024 and 03/05/2024-03/12/2024.   Prescribed:  Torsemide  20 mg take 2 tablet(s) (40 mg total) by mouth daily.   Potassium 10 mEq take 4 tablet(s) (40 mEq total) by mouth daily  Spironolactone  25 mg take 1 (25 mg total) tablet by mouth every evening   Labs: 02/08/2024 Creatinine 0.73, BUN 15, Potassium 4.2, Sodium 141, GFR 85  02/01/2024 Creatinine 0.78, BUN 25, Potassium 3.7, Sodium 140  01/19/2024 Creatinine 0.73, BUN 19, Potassium 3.5, Sodium 136  01/11/2024 Creatinine 0.66, BUN 17, Potassium 3.7, Sodium 139, GFR >60  01/10/2024 Creatinine 0.73, BUN 20, Potassium 4.3, Sodium 140, GFR >60  A complete set of results can be found in Results Review.   Recommendations:  No changes and encouraged to call if experiencing any fluid symptoms.   Follow-up plan: ICM clinic phone  appointment on 03/20/2024.  91 day device clinic remote transmission 03/29/2024.     EP/Cardiology Office Visits:  Recall 04/19/2024 with Dr Inocencio.   Copy of ICM check sent to Dr. Inocencio.    Remote monitoring is medically necessary for Heart Failure Management.    Daily Thoracic Impedance ICM trend: 12/20/2023 through 03/20/2024.    12-14 Month Thoracic Impedance ICM trend:     Kathleen GORMAN Garner, RN 03/22/2024 3:39 PM

## 2024-03-24 ENCOUNTER — Ambulatory Visit: Payer: Self-pay | Admitting: Gastroenterology

## 2024-03-24 ENCOUNTER — Other Ambulatory Visit (INDEPENDENT_AMBULATORY_CARE_PROVIDER_SITE_OTHER)

## 2024-03-24 ENCOUNTER — Ambulatory Visit (INDEPENDENT_AMBULATORY_CARE_PROVIDER_SITE_OTHER): Admitting: Gastroenterology

## 2024-03-24 ENCOUNTER — Telehealth (HOSPITAL_BASED_OUTPATIENT_CLINIC_OR_DEPARTMENT_OTHER): Payer: Self-pay

## 2024-03-24 ENCOUNTER — Encounter: Payer: Self-pay | Admitting: Gastroenterology

## 2024-03-24 VITALS — BP 118/84 | HR 72 | Ht 60.0 in | Wt 189.0 lb

## 2024-03-24 DIAGNOSIS — N898 Other specified noninflammatory disorders of vagina: Secondary | ICD-10-CM

## 2024-03-24 DIAGNOSIS — D649 Anemia, unspecified: Secondary | ICD-10-CM

## 2024-03-24 DIAGNOSIS — N824 Other female intestinal-genital tract fistulae: Secondary | ICD-10-CM

## 2024-03-24 DIAGNOSIS — K439 Ventral hernia without obstruction or gangrene: Secondary | ICD-10-CM | POA: Diagnosis not present

## 2024-03-24 DIAGNOSIS — K5732 Diverticulitis of large intestine without perforation or abscess without bleeding: Secondary | ICD-10-CM | POA: Diagnosis not present

## 2024-03-24 DIAGNOSIS — N939 Abnormal uterine and vaginal bleeding, unspecified: Secondary | ICD-10-CM

## 2024-03-24 LAB — CBC WITH DIFFERENTIAL/PLATELET
Basophils Absolute: 0.1 K/uL (ref 0.0–0.1)
Basophils Relative: 0.8 % (ref 0.0–3.0)
Eosinophils Absolute: 0.1 K/uL (ref 0.0–0.7)
Eosinophils Relative: 1.5 % (ref 0.0–5.0)
HCT: 40.9 % (ref 36.0–46.0)
Hemoglobin: 13.3 g/dL (ref 12.0–15.0)
Lymphocytes Relative: 9 % — ABNORMAL LOW (ref 12.0–46.0)
Lymphs Abs: 0.8 K/uL (ref 0.7–4.0)
MCHC: 32.5 g/dL (ref 30.0–36.0)
MCV: 81.9 fl (ref 78.0–100.0)
Monocytes Absolute: 0.6 K/uL (ref 0.1–1.0)
Monocytes Relative: 6.9 % (ref 3.0–12.0)
Neutro Abs: 7.5 K/uL (ref 1.4–7.7)
Neutrophils Relative %: 81.8 % — ABNORMAL HIGH (ref 43.0–77.0)
Platelets: 186 K/uL (ref 150.0–400.0)
RBC: 4.99 Mil/uL (ref 3.87–5.11)
RDW: 16.8 % — ABNORMAL HIGH (ref 11.5–15.5)
WBC: 9.2 K/uL (ref 4.0–10.5)

## 2024-03-24 LAB — BASIC METABOLIC PANEL WITH GFR
BUN: 17 mg/dL (ref 6–23)
CO2: 30 meq/L (ref 19–32)
Calcium: 9.6 mg/dL (ref 8.4–10.5)
Chloride: 102 meq/L (ref 96–112)
Creatinine, Ser: 0.69 mg/dL (ref 0.40–1.20)
GFR: 83.82 mL/min (ref >60.00)
Glucose, Bld: 117 mg/dL — ABNORMAL HIGH (ref 70–99)
Potassium: 4.1 meq/L (ref 3.5–5.1)
Sodium: 141 meq/L (ref 135–145)

## 2024-03-24 LAB — IBC + FERRITIN
Ferritin: 31 ng/mL (ref 10.0–291.0)
Iron: 40 ug/dL — ABNORMAL LOW (ref 42–145)
Saturation Ratios: 10.2 % — ABNORMAL LOW (ref 20.0–50.0)
TIBC: 393.4 ug/dL (ref 250.0–450.0)
Transferrin: 281 mg/dL (ref 212.0–360.0)

## 2024-03-24 LAB — VITAMIN B12: Vitamin B-12: 1077 pg/mL — ABNORMAL HIGH (ref 211–911)

## 2024-03-24 LAB — FOLATE: Folate: 14.2 ng/mL (ref >5.9)

## 2024-03-24 NOTE — Progress Notes (Unsigned)
 GASTROENTEROLOGY OUTPATIENT CLINIC VISIT   Primary Care Provider Joshua Debby CROME, MD [address] [place] [address] [phone]   Patient Profile: Kathleen [name] is a 78 y.o. 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 fib (status post prior AVN ablation and PPM on anticoagulation), hypertension, gout, CRI, iron deficiency, family history of pancreas cancer (mother), colon polyps (found on CT colonography in 2021), diverticulitis (complicated with colovaginal fistula).  The patient presents to the [facility] for an evaluation and management of problem(s) noted below:  Problem List No diagnosis found.   Discussed the use of AI scribe software for clinical note transcription with the patient, who gave verbal consent to proceed.  History of Present Illness Please see prior notes for full details of HPI.  Interval History The patient presents for follow-up today.   The patient was recently admitted to the hospital with issues of acute on chronic respiratory failure due to her COPD exacerbation as well as an episode of septic physiology with acute diverticulitis and persisting colovaginal fistula requiring 2-week course of antibiotic therapy and also self-limited rectal bleeding felt to be a likely diverticular bleed (though no endoscopic evaluation performed).  Post hospital discharge, she continued oral antibiotics, including Flagyl  and Doxycycline , for an additional few days.  As has been documented previously she has had multiple diverticulitis flares which have led to complication of colovaginal fistula.  She continues to have passage of stool from the vagina at times, but interestingly, she reports that in the last few days, she has also noticed stool passing from the rectum, which was previously more of a rare occurrence.  She has not noted any blood in her stools  since her hospitalization either.  The patient redescribes her longer standing chronic constipation since childhood, which has been managed with various laxatives.  Using Trulance  samples helped her, and she wonders if she can continue this with her current colovaginal fistula.  She also reports occasional pill dysphagia.  This has improved since being in the hospital.  She denies solid or liquid dysphagia symptoms.  She has concerns about performing an upper endoscopy.   GI Review of Systems Positive as above Negative for odynophagia, nausea, vomiting, progressive abdominal pain  Review of Systems General: Denies fevers/chills Cardiovascular: Denies chest pain Pulmonary: SOB at baseline per her report (remains on 5 L O2) Gastroenterological: See HPI Genitourinary: Denies darkened urine or hematuria Hematological: Positive for history of easy bruising/bleeding due to anticoagulation Dermatological: Denies jaundice Psychological: Mood is anxious   Medications Current Outpatient Medications  Medication Sig Dispense Refill   Cholecalciferol  (VITAMIN D3) 50 MCG (2000 UT) TABS Take 2,000 Units by mouth at bedtime.     cyanocobalamin  (VITAMIN B12) 1000 MCG tablet Take 1,000 mcg by mouth daily.     docusate sodium  (COLACE) 100 MG capsule Take 100 mg by mouth daily.     dofetilide  (TIKOSYN ) 250 MCG capsule TAKE ONE CAPSULE BY MOUTH EVERY MORNING AND AT BEDTIME. 60 capsule 0   ELIQUIS  5 MG TABS tablet Take 1 tablet (5 mg total) by mouth 2 (two) times daily. 60 tablet 11   febuxostat  (ULORIC ) 40 MG tablet Take 1 tablet (40 mg total) by mouth daily. 90 tablet 1   fluticasone  furoate-vilanterol (BREO ELLIPTA ) 100-25 MCG/ACT AEPB INHALE 1 PUFF INTO THE LUNGS DAILY 60 each 11   gabapentin  (NEURONTIN ) 300 MG capsule Take 1 capsule (300 mg  total) by mouth at bedtime. 90 capsule 3   ketoconazole  (NIZORAL ) 2 % cream Apply 1 Application topically 2 (two) times daily. (Patient taking differently: Apply 1  Application topically as needed.) 60 g 2   levalbuterol  (XOPENEX ) 0.63 MG/3ML nebulizer solution Take 3 mLs (0.63 mg total) by nebulization every 6 (six) hours as needed for wheezing or shortness of breath. DX J44.89 J96.21 3 mL 12   Magnesium  500 MG TABS Take 500 mg by mouth every evening.     mupirocin  ointment (BACTROBAN ) 2 % Apply 1 Application topically 2 (two) times daily. 30 g 3   naloxone  (NARCAN ) nasal spray 4 mg/0.1 mL Place 1 spray into the nose once as needed (opioid overdose).     oxyCODONE -acetaminophen  (PERCOCET/ROXICET) 5-325 MG tablet Take 1 tablet by mouth every 8 (eight) hours as needed for moderate pain (pain score 4-6).     OXYGEN  Inhale 4 L into the lungs continuous. Use with resmed ventilator     potassium chloride  (KLOR-CON ) 10 MEQ tablet Take 4 tablets (40 mEq total) by mouth daily. 360 tablet 2   revefenacin  (YUPELRI ) 175 MCG/3ML nebulizer solution Take 175 mcg by nebulization daily.     spironolactone  (ALDACTONE ) 25 MG tablet Take 1 tablet (25 mg total) by mouth every evening. (Patient taking differently: Take 12.5 mg by mouth every evening.) 90 tablet 3   torsemide  (DEMADEX ) 20 MG tablet Take 2 tablets (40 mg total) by mouth daily. (Patient taking differently: Take 20 mg by mouth daily.) 180 tablet 3   triamcinolone  cream (KENALOG ) 0.1 % Apply 1 Application topically 2 (two) times daily. 45 g 0   No current facility-administered medications for this visit.    Allergies Allergies  Allergen Reactions   Allopurinol Other (See Comments)    Reaction:  Dizziness    Clindamycin Anaphylaxis and Hives   Flublok [Influenza Vaccine Recombinant] Other (See Comments)    Fever 103 with no alternative explanation day after vaccine.  Darice Highfill FNP-C   Pneumococcal 13-Val Conj Vacc Itching, Swelling and Rash   Dronedarone Rash   Montelukast  Other (See Comments)    Makes her loopy.   Brovana  [Arformoterol ]     Caused muscle pain   Budesonide      Caused extreme joint pain    Entresto  [Sacubitril -Valsartan ] Other (See Comments)    hypotension   Fosamax [Alendronate Sodium] Nausea Only   Jardiance  [Empagliflozin ]     Caused a vaginal infection   Meperidine Nausea And Vomiting   Microplegia Msa-Msg [Plegisol]     Loopy,diarrhea   Rosuvastatin Other (See Comments)    Reaction:  Muscle spasms    Tetracycline Hives   Adhesive [Tape] Rash   Lovastatin Rash and Other (See Comments)    Muscle Pain    Histories Past Medical History:  Diagnosis Date   Arthritis    Asthma    Blood transfusion without reported diagnosis 2015   in hospital   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HCC)    Cataract 2016   very small   CHF (congestive heart failure) (HCC)    Chronic renal insufficiency    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    Concussion 10/04/2021   COPD (chronic obstructive pulmonary disease) (HCC)    Diverticulitis    Emphysema of lung (HCC) 2013   lung collapsed   Gout    Hypertension    Longstanding persistent atrial fibrillation (HCC)    on Xarelto    Nonischemic cardiomyopathy (HCC)  Obesity    Oxygen  deficiency 2013   Pacemaker    Spontaneous pneumothorax 2013   Thyroid  disease 1990   Past Surgical History:  Procedure Laterality Date   ABDOMINAL HYSTERECTOMY     APPENDECTOMY     APPLICATION OF WOUND VAC  04/27/2023   Procedure: APPLICATION OF WOUND VAC;  Surgeon: Ebbie Cough, MD;  Location: [facility] OR;  Service: General;;   ATRIAL FIBRILLATION ABLATION  07/20/2013   by Dr Pierrette   AV nodal ablation  11/01/2013   by Dr Pierrette, repeated by Dr Rosine   BREAST BIOPSY Bilateral 1997   negative   CARDIAC CATHETERIZATION     CESAREAN SECTION  1976   CHOLECYSTECTOMY     COLON RESECTION SIGMOID N/A 04/27/2023   Procedure: COLON RESECTION SIGMOID WITH COLOSTOMY;  Surgeon: Ebbie Cough, MD;  Location: [facility] OR;  Service: General;  Laterality: N/A;   COLON SURGERY  2024   ESOPHAGOGASTRODUODENOSCOPY  (EGD) WITH PROPOFOL  N/A 05/24/2023   Procedure: ESOPHAGOGASTRODUODENOSCOPY (EGD) WITH PROPOFOL ;  Surgeon: Albertus Gordy HERO, MD;  Location: [facility] ENDOSCOPY;  Service: Gastroenterology;  Laterality: N/A;   HERNIA REPAIR     LAPAROTOMY N/A 04/27/2023   Procedure: EXPLORATORY LAPAROTOMY;  Surgeon: Ebbie Cough, MD;  Location[facility] OR;  Service: General;  Laterality: N/A;   PACEMAKER INSERTION  06/2017   MDT Viva CRT-P implanted by Dr Pierrette after AV nodal ablation,  LV lead could not be placed   PACEMAKER INSERTION  05/2020   with lead bundle   RIGHT/LEFT HEART CATH AND CORONARY ANGIOGRAPHY N/A 03/20/2022   Procedure: RIGHT/LEFT HEART CATH AND CORONARY ANGIOGRAPHY;  Surgeon: Swazil[place], Peter M, MD;  Location: [facility] INVASIVE CV LAB;  Service: Cardiovascular;  Laterality: N/A;   TUBAL LIGATION  1976   Social History   Socioeconomic History   Marital status: Widowed    Spouse name: Not on file   Number of children: 1   Years of education: Not on file   Highest education level: Not on file  Occupational History   Occupation: retired  Tobacco Use   Smoking status: Former    Current packs/day: 0.00    Average packs/day: 1 pack/day for 20.0 years (20.0 ttl pk-yrs)    Types: Cigarettes    Start date: 06/09/1967    Quit date: 06/09/1987    Years since quitting: 36.8    Passive exposure: Past   Smokeless tobacco: Never   Tobacco comments:    Former smoker 12/25/21  Vaping Use   Vaping status: Never Used  Substance and Sexual Activity   Alcohol use: No    Alcohol/week: 0.0 standard drinks of alcohol   Drug use: No   Sexual activity: Not Currently  Other Topics Concern   Not on file  Social History Narrative   Pt lives in [place] alone.  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  Her son works in [employer].   Social Drivers of Corporate investment banker Strain: Low Risk  (09/15/2023)   Overall Financial Resource Strain (CARDIA)    Difficulty of Paying Living Expenses: Not hard at  all  Food Insecurity: No Food Insecurity (12/01/2023)   Hunger Vital Sign    Worried About Running Out of Food in the Last Year: Never true    Ran Out of Food in the Last Year: Never true  Transportation Needs: No Transportation Needs (12/01/2023)   PRAPARE - Transportation    Lack of Transportation (Medical): No    Lack of  Transportation (Non-Medical): No  Physical Activity: Insufficiently Active (09/15/2023)   Exercise Vital Sign    Days of Exercise per Week: 2 days    Minutes of Exercise per Session: 60 min  Stress: Stress Concern Present (09/15/2023)   [employer] of Occupational Health - Occupational Stress Questionnaire    Feeling of Stress : To some extent  Social Connections: Socially Isolated (11/26/2023)   Social Connection and Isolation Panel    Frequency of Communication with Friends and Family: More than three times a week    Frequency of Social Gatherings with Friends and Family: Once a week    Attends Religious Services: Never    Database administrator or Organizations: No    Attends Banker Meetings: Never    Marital Status: Widowed  Intimate Partner Violence: Not At Risk (12/01/2023)   Humiliation, Afraid, Rape, and Kick questionnaire    Fear of Current or Ex-Partner: No    Emotionally Abused: No    Physically Abused: No    Sexually Abused: No   Family History  Problem Relation Age of Onset   Breast cancer Mother 55       3 different times   Pancreatic cancer Mother    Emphysema Father    Healthy Brother    Breast cancer Cousin    Valvular heart disease Son    Obesity Daughter    Colon cancer Neg Hx    Esophageal cancer Neg Hx    Stomach cancer Neg Hx    Inflammatory bowel disease Neg Hx    Liver disease Neg Hx    Rectal cancer Neg Hx    I have reviewed her medical, social, and family history in detail and updated the electronic medical record as necessary.    PHYSICAL EXAMINATION  There were no vitals taken for this visit. Wt Readings  from Last 3 Encounters:  02/08/24 186 lb 9.6 oz (84.6 kg)  02/01/24 185 lb (83.9 kg)  01/25/24 188 lb 11.2 oz (85.6 kg)  GEN: NAD, appears older than stated age, appears chronically ill but is nontoxic (looks more ill then prior visit however), accompanied by son PSYCH: Cooperative, without pressured speech EYE: Conjunctivae pale-pink ENT: MMM, [place] in place CV: Nontachycardic RESP: Audible wheezing present GI: NABS, soft, rounded, obese, minimal TTP in LLQ without rebound or guarding (better than previous clinic visit) MSK/EXT: Bilateral lower extremity edema present SKIN: No jaundice NEURO:  Alert & Oriented x 3, no focal deficits   REVIEW OF DATA  I reviewed the following data at the time of this encounter:  GI Procedures and Studies  12/24 EGD - Esophageal plaques were found, consistent with candidiasis. - No evidence of esophageal foreign body. - Normal stomach. - Normal examined duodenum.  Laboratory Studies  Reviewed those in epic and care everywhere  Imaging Studies  October 24 CT abdomen pelvis with contrast IMPRESSION: 1. Interval decrease in free fluid seen previously anterior to the distal sigmoid colon and in the cul-de-sac. 2. Loss of fat plane between the vaginal cuff and the distal sigmoid colon. As before, vaginal vault is filled with gas and high density material, features compatible with reported clinical history of colovaginal fistula. 3.  Aortic Atherosclerosis (ICD10-I70.0).  October 19 CT abdomen pelvis with contrast IMPRESSION: Persistent changes consistent with a colovaginal fistula. Persistent acute sigmoid diverticulitis is identified as well. No discrete abscess is noted. Stable 5 mm nodule in the right lower lobe. No follow-up needed if patient is low-risk. Non-contrast chest  CT can be considered in 12 months if patient is high-risk. This recommendation follows the consensus statement: Guidelines for Management of Incidental Pulmonary Nodules  Detected on CT Images: From the Fleischner Society 2017; Radiology 2017; 284:228-243.   ASSESSMENT  Ms. Larrick is a 78 y.o. 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 fib (status post prior AVN ablation and PPM on anticoagulation), hypertension, gout, CRI, iron deficiency, family history of pancreas cancer (mother), colon polyps (found on CT colonography in 2021), diverticulitis (complicated with colovaginal fistula).  The patient is seen today for evaluation and management of:  No diagnosis found.  The patient is hemodynamically stable.  Clinically however she continues to be in a very difficult situation with complicated diverticulitis and a most recent hospitalization with recurrent diverticulitis and potentially diverticular hemorrhage.  Thankfully she is improved and now discharged.  Completion of antibiotic therapy is reasonable.  We discussed that it is okay for her to not be on a low residue diet, if she is feeling that she can advance her diet somewhat.  She would not benefit however from being on a very high fiber diet currently.  For her chronic constipation, I did improve with Trulance  samples and we will plan to give her a prescription that she can use on a daily or every other day basis to help keep things moving and hopefully prevent her from having further issues.  I plan to still move forward with her high risk colonoscopy to ensure that we are not missing a colon cancer masquerading as recurrent diverticulitis as a reason for her fistulization into her vagina and also to attempt to remove the previously noted polyps that were found on her virtual colonoscopy.  She and her son understand that her anesthesia risks are high.  The risks and benefits of endoscopic evaluation were discussed with the patient; these include but are not limited to the risk of perforation, infection, bleeding, missed lesions,  lack of diagnosis, severe illness requiring hospitalization, as well as anesthesia and sedation related illnesses.  The patient and/or family is agreeable to proceed.  We discussed consideration of an upper endoscopy for her issues of pill dysphagia, but she understands and is very worried about an upper endoscopy in the setting of her tracheomalacia and her respiratory status.  As such we offered the consideration of a barium swallow but as she feels she is not having true solid food dysphagia or liquid dysphagia, she does not want to pursue this currently.  Will hold off on the upper endoscopy.  I will get a CT scan 1-1/2 to 1-week before her scheduled colonoscopy to ensure that there is no complicating factors for me to move forward with her high risk colonoscopy in the hospital-based setting.  We will get approval for anticoagulation hold.  Will also ask our cardiology and pulmonary colleagues to see if she is as optimized as possible before her upcoming colonoscopy.  Appreciate surgery evaluating the patient for consideration of treatment for her colovaginal fistula.  Multiple discussions today by the patient and patient's son as to what type of interventions could be offered by surgery and I told them both that this is more specific for our surgeons to discuss with them. All patient questions were answered to the best of my ability, and the patient agrees to the aforementioned plan of action with follow-up as indicated.    PLAN  Laboratories as outlined below within  1-1/2 to 1-week before planned colonoscopy Proceed with scheduling CT abdomen pelvis with contrast to be performed 1-1/2 to 1-week before planned colonoscopy to ensure safety of moving forward with procedure Proceed with scheduling colonoscopy Holding on EGD even though she has indication of prior iron deficiency because she feels she is high risk for complications and endoscopy Appreciate pulmonary and cardiology optimization prior to  endoscopic evaluation and also for our surgeons as they move forward with potential interventions in the near future  Trulance  3 mg daily   No orders of the defined types were placed in this encounter.   New Prescriptions   No medications on file   Modified Medications   No medications on file    Planned Follow Up No follow-ups on file.   Total Time in Face-to-Face and in Coordination of Care for patient including independent/personal interpretation/review of prior testing, medical history, examination, medication adjustment, communicating results with the patient directly, and documentation within the EHR is 40 minutes (30 minutes with the patient and son and >10 minutes on note completion).   Aloha Finner, MD  Gastroenterology Advanced Endoscopy Office # [phone]

## 2024-03-24 NOTE — Telephone Encounter (Signed)
 CMN received for oxygen  signed from [employer] by provider and faxed confirmation received

## 2024-03-24 NOTE — Patient Instructions (Addendum)
 Purchase a ostomy abdominal binder from [facility].  Your provider has requested that you go to the basement level for lab work before leaving today. Press B on the elevator. The lab is located at the first door on the left as you exit the elevator.  We have referred you to Gynocology. They will contact you directly with an appointment.   You have been scheduled for a CT scan of the abdomen and pelvis at [facility], 1st floor Radiology. You are scheduled on 03/31/24 at 4:30pm-arriving at 2:15 pm.   Please follow the written instructions below on the day of your exam:   1) Do not eat anything after 12:30pm (4 hours prior to your test)    You may take any medications as prescribed with a small amount of water, if necessary. If you take any of the following medications: METFORMIN, GLUCOPHAGE, GLUCOVANCE, AVANDAMET, RIOMET, FORTAMET, ACTOPLUS MET, JANUMET, GLUMETZA or METAGLIP, you MAY be asked to HOLD this medication 48 hours AFTER the exam.   The purpose of you drinking the oral contrast is to aid in the visualization of your intestinal tract. The contrast solution may cause some diarrhea. Depending on your individual set of symptoms, you may also receive an intravenous injection of x-ray contrast/dye. Plan on being at [facility] for 45 minutes or longer, depending on the type of exam you are having performed.   If you have any questions regarding your exam or if you need to reschedule, you may call [facility] at [phone] between the hours of 8:00 am and 5:00 pm, Monday-Friday.   _______________________________________________________  If your blood pressure at your visit was 140/90 or greater, please contact your primary care physician to follow up on this.  _______________________________________________________  If you are age 27 or older, your body mass index should be between 23-30. Your Body mass index is 36.91 kg/m. If this is out of the aforementioned  range listed, please consider follow up with your Primary Care Provider.  If you are age 71 or younger, your body mass index should be between 19-25. Your Body mass index is 36.91 kg/m. If this is out of the aformentioned range listed, please consider follow up with your Primary Care Provider.   ________________________________________________________  The [facility] providers would like to encourage you to use MYCHART to communicate with providers for non-urgent requests or questions.  Due to long hold times on the telephone, sending your provider a message by [facility] may be a faster and more efficient way to get a response.  Please allow 48 business hours for a response.  Please remember that this is for non-urgent requests.  _______________________________________________________  [facility] is using a team-based approach to care.  Your team is made up of your doctor and two to three APPS. Our APPS (Nurse Practitioners and Physician Assistants) work with your physician to ensure care continuity for you. They are fully qualified to address your health concerns and develop a treatment plan. They communicate directly with your gastroenterologist to care for you. Seeing the Advanced Practice Practitioners on your physician's team can help you by facilitating care more promptly, often allowing for earlier appointments, access to diagnostic testing, procedures, and other specialty referrals.

## 2024-03-25 ENCOUNTER — Encounter: Payer: Self-pay | Admitting: Gastroenterology

## 2024-03-25 DIAGNOSIS — N898 Other specified noninflammatory disorders of vagina: Secondary | ICD-10-CM | POA: Insufficient documentation

## 2024-03-25 DIAGNOSIS — K5732 Diverticulitis of large intestine without perforation or abscess without bleeding: Secondary | ICD-10-CM | POA: Insufficient documentation

## 2024-03-25 DIAGNOSIS — N939 Abnormal uterine and vaginal bleeding, unspecified: Secondary | ICD-10-CM | POA: Insufficient documentation

## 2024-03-25 DIAGNOSIS — K439 Ventral hernia without obstruction or gangrene: Secondary | ICD-10-CM | POA: Insufficient documentation

## 2024-03-28 ENCOUNTER — Other Ambulatory Visit: Payer: Self-pay

## 2024-03-28 MED ORDER — FERROUS GLUCONATE 324 (38 FE) MG PO TABS: 324.0000 mg | ORAL_TABLET | Freq: Every day | ORAL | 3 refills | Status: AC

## 2024-03-28 NOTE — Telephone Encounter (Signed)
 Patty, Lets initiate ferrous gluconate 324 mg daily (30/6). She can continue taking her B12 as she is already doing. I am not familiar with a decreased amount of contrast being done when patients have ostomies.  If there is a protocol per radiology, I am fine with that otherwise would drink what they have outlined for the patient. Thanks. GM

## 2024-03-29 ENCOUNTER — Ambulatory Visit

## 2024-03-29 DIAGNOSIS — I442 Atrioventricular block, complete: Secondary | ICD-10-CM

## 2024-03-30 ENCOUNTER — Encounter (HOSPITAL_BASED_OUTPATIENT_CLINIC_OR_DEPARTMENT_OTHER)

## 2024-03-30 ENCOUNTER — Ambulatory Visit (HOSPITAL_BASED_OUTPATIENT_CLINIC_OR_DEPARTMENT_OTHER)

## 2024-03-30 ENCOUNTER — Other Ambulatory Visit (HOSPITAL_BASED_OUTPATIENT_CLINIC_OR_DEPARTMENT_OTHER): Payer: Self-pay

## 2024-03-30 ENCOUNTER — Encounter (HOSPITAL_BASED_OUTPATIENT_CLINIC_OR_DEPARTMENT_OTHER): Payer: Self-pay

## 2024-03-30 ENCOUNTER — Encounter (HOSPITAL_BASED_OUTPATIENT_CLINIC_OR_DEPARTMENT_OTHER): Payer: Self-pay | Admitting: Pulmonary Disease

## 2024-03-30 ENCOUNTER — Ambulatory Visit (INDEPENDENT_AMBULATORY_CARE_PROVIDER_SITE_OTHER): Admitting: Pulmonary Disease

## 2024-03-30 VITALS — BP 134/66 | HR 85 | Ht 60.0 in | Wt 189.6 lb

## 2024-03-30 DIAGNOSIS — G4730 Sleep apnea, unspecified: Secondary | ICD-10-CM | POA: Diagnosis not present

## 2024-03-30 DIAGNOSIS — J4489 Other specified chronic obstructive pulmonary disease: Secondary | ICD-10-CM

## 2024-03-30 DIAGNOSIS — R609 Edema, unspecified: Secondary | ICD-10-CM

## 2024-03-30 DIAGNOSIS — R0602 Shortness of breath: Secondary | ICD-10-CM

## 2024-03-30 DIAGNOSIS — J398 Other specified diseases of upper respiratory tract: Secondary | ICD-10-CM

## 2024-03-30 DIAGNOSIS — J9611 Chronic respiratory failure with hypoxia: Secondary | ICD-10-CM | POA: Diagnosis not present

## 2024-03-30 LAB — CUP PACEART REMOTE DEVICE CHECK
Battery Remaining Longevity: 65 mo
Battery Voltage: 2.96 V
Brady Statistic AP VP Percent: 80.87 %
Brady Statistic AP VS Percent: 0.02 %
Brady Statistic AS VP Percent: 19.11 %
Brady Statistic AS VS Percent: 0 %
Brady Statistic RA Percent Paced: 80.82 %
Brady Statistic RV Percent Paced: 99.98 %
Date Time Interrogation Session: 20251021212147
Implantable Lead Connection Status: 753985
Implantable Lead Connection Status: 753985
Implantable Lead Implant Date: 20150527
Implantable Lead Implant Date: 20211221
Implantable Lead Location: 753858
Implantable Lead Location: 753860
Implantable Lead Model: 3830
Implantable Lead Model: 5076
Implantable Pulse Generator Implant Date: 20211221
Lead Channel Impedance Value: 266 Ohm
Lead Channel Impedance Value: 323 Ohm
Lead Channel Impedance Value: 323 Ohm
Lead Channel Impedance Value: 342 Ohm
Lead Channel Impedance Value: 380 Ohm
Lead Channel Impedance Value: 456 Ohm
Lead Channel Impedance Value: 456 Ohm
Lead Channel Impedance Value: 475 Ohm
Lead Channel Impedance Value: 494 Ohm
Lead Channel Pacing Threshold Amplitude: 0.625 V
Lead Channel Pacing Threshold Amplitude: 0.75 V
Lead Channel Pacing Threshold Amplitude: 0.75 V
Lead Channel Pacing Threshold Pulse Width: 0.4 ms
Lead Channel Pacing Threshold Pulse Width: 0.4 ms
Lead Channel Pacing Threshold Pulse Width: 0.4 ms
Lead Channel Sensing Intrinsic Amplitude: 1.5 mV
Lead Channel Sensing Intrinsic Amplitude: 1.5 mV
Lead Channel Sensing Intrinsic Amplitude: 6.25 mV
Lead Channel Sensing Intrinsic Amplitude: 6.25 mV
Lead Channel Setting Pacing Amplitude: 1.25 V
Lead Channel Setting Pacing Amplitude: 1.5 V
Lead Channel Setting Pacing Amplitude: 2 V
Lead Channel Setting Pacing Pulse Width: 0.4 ms
Lead Channel Setting Pacing Pulse Width: 0.4 ms
Lead Channel Setting Sensing Sensitivity: 2 mV
Zone Setting Status: 755011

## 2024-03-30 LAB — PULMONARY FUNCTION TEST
FEF 25-75 Post: 0.73 L/s
FEF 25-75 Pre: 0.57 L/s
FEF2575-%Change-Post: 27 %
FEF2575-%Pred-Post: 54 %
FEF2575-%Pred-Pre: 42 %
FEV1-%Change-Post: 7 %
FEV1-%Pred-Post: 71 %
FEV1-%Pred-Pre: 65 %
FEV1-Post: 1.19 L
FEV1-Pre: 1.1 L
FEV1FVC-%Change-Post: 4 %
FEV1FVC-%Pred-Pre: 83 %
FEV6-%Change-Post: 3 %
FEV6-%Pred-Post: 85 %
FEV6-%Pred-Pre: 82 %
FEV6-Post: 1.83 L
FEV6-Pre: 1.76 L
FEV6FVC-%Change-Post: 0 %
FEV6FVC-%Pred-Post: 105 %
FEV6FVC-%Pred-Pre: 105 %
FVC-%Change-Post: 2 %
FVC-%Pred-Post: 80 %
FVC-%Pred-Pre: 78 %
FVC-Post: 1.83 L
FVC-Pre: 1.77 L
Post FEV1/FVC ratio: 65 %
Post FEV6/FVC ratio: 100 %
Pre FEV1/FVC ratio: 62 %
Pre FEV6/FVC Ratio: 99 %

## 2024-03-30 MED ORDER — AZITHROMYCIN 250 MG PO TABS: 250.0000 mg | ORAL_TABLET | Freq: Every day | ORAL | 0 refills | Status: DC

## 2024-03-30 MED ORDER — AZELASTINE HCL 0.1 % NA SOLN: 2.0000 | Freq: Two times a day (BID) | NASAL | 5 refills | Status: AC

## 2024-03-30 NOTE — Progress Notes (Addendum)
 Subjective:    Patient ID: Kathleen [name], female    DOB: Nov 03, 1945, 78 y.o.   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 admitted  04/2022 for acute on chronic respiratory failure secondary to RSV pneumonia/Boop? >> prolonged steroid taper upon discharge.   Meds -She was switched from an inhaler regimen in 2022 to budesonide /Brovana  and Yupelri  combination -did not tolerate budesonide /Brovana  due to joint pains - did not tolerate symbicort     Discussed the use of AI scribe software for clinical note transcription with the patient, who gave verbal consent to proceed.  History of Present Illness Kathleen [name] is a 78 year old female with chronic respiratory issues who presents with worsening shortness of breath and cough. She is accompanied by her daughter-in-law, [name].  She experiences increased shortness of breath during light activities, such as watering plants or cooking breakfast, with exacerbation when cooking bacon. She uses Breo and a nebulizer for her respiratory condition and a BiPAP machine at night, though she finds the  mask uncomfortable. She is on home oxygen  at 4.5 liters, increasing to 5 liters with a portable concentrator.  She has had a cough for the past three weeks, producing yellow-green sputum, and experiences wheezing, particularly in the mornings. Swelling in her left nostril affects her breathing.  Her weight has increased by 5-6 pounds recently, correlating with increased difficulty in breathing. She adjusts her diuretic intake based on weight fluctuations.  Colostomy status with persistent enterocutaneous fistula, with ongoing discharge and occasional bleeding. The fistula has not decreased in size over the past year, raising concerns about potential other sources of bleeding. CT planned.   Significant tests/ events reviewed BiPAP titration study 01/2021 12/8 cm   ABG 03/2022 7.3 4/33/87 04/2022 CT sinuses diffuse paranasal sinus disease  03/2022 CTA chest neg  08/2021 CT chest without contrast emphysema, stable nodules   HRCT 01/2021 emphysema, tracheobronchomalacia , bland scarring both bases , new atelectasis/consolidation of the lingula with scattered groundglass in left upper lobe   01/2020  With exertion her lowest oxygen  saturation was 92% on room air. Her peak HR was 103. She walked a total of 43m which was 25% predicted     Spiro 01/2021 severe airway obstruction, ratio 59, FEV1 0.95/48%, FVC 1.61/64% Spiro 03/2024 ratio 62, FEV1 65%, no sig BD response  Review of Systems  neg for any significant sore throat, dysphagia, itching, sneezing, nasal congestion or excess/ purulent secretions, fever, chills, sweats, unintended wt loss, pleuritic or exertional cp, hempoptysis, orthopnea pnd or change in chronic leg swelling. Also denies presyncope, palpitations, heartburn, abdominal pain, nausea, vomiting, diarrhea or change in bowel or urinary habits, dysuria,hematuria, rash, arthralgias, visual  complaints, headache, numbness weakness or ataxia.      Objective:   Physical Exam  Gen.  Pleasant, obese, in no distress ENT - no lesions, no post nasal drip Neck: No JVD, no thyromegaly, no carotid bruits Lungs: no use of accessory muscles, no dullness to percussion, decreased without rales or rhonchi  Cardiovascular: Rhythm regular, heart sounds  normal, no murmurs or gallops, no peripheral edema Musculoskeletal: No deformities, no cyanosis or clubbing , no tremors  Abd- colostomy      Assessment & Plan:   Assessment and Plan Assessment & Plan Chronic obstructive pulmonary disease (COPD) with acute bronchitis and productive cough COPD with acute bronchitis, presenting with a productive cough and wheezing. Lung function tests show 65% FEV1, improving to 70% post-albuterol , indicating moderate obstruction. The cough has been present for three weeks, with yellow-green sputum production, suggesting a bacterial infection. Wheezing noted, and she is on home oxygen  therapy, currently at 4.5 liters, with adjustments made based on oxygen  saturation levels. - Prescribe azithromycin  (Z-Pak) for acute bronchitis. - Refill azelastine  nasal spray for nasal congestion. - Continue Breo and Yupelri  inhalers.  Chr resp failure with hypoxia Tracheomalacia, on bipap - Switch to a full face mask for BiPAP to improve comfort and compliance. - Encourage use of BiPAP every night. - Monitor oxygen  levels and adjust oxygen  therapy as needed, ensuring saturation remains above 88%.  Fluid overload (edema) Fluid overload with recent weight gain of 5-6 pounds, causing increased difficulty in breathing. She is on diuretics, but adjustments are needed to manage fluid status effectively. - Increase torsemide  to two tablets daily until weight decreases. - Monitor weight and adjust diuretic therapy based on weight changes.   Ventral hernia post-colon surgery Ventral hernia post-colon surgery, noted to be enlarging. Surgical repair is not recommended due to high surgical risk. The hernia was a result of  previous emergency colon surgery. - restricts breathing, prevent abdominal distension/constipation

## 2024-03-30 NOTE — Addendum Note (Signed)
 Addended by: Shantika Bermea O on: 03/30/2024 05:14 PM   Modules accepted: Orders

## 2024-03-30 NOTE — Progress Notes (Signed)
Spirometry pre/post performed today. 

## 2024-03-30 NOTE — Patient Instructions (Signed)
Spirometry pre/post performed today. 

## 2024-03-30 NOTE — Patient Instructions (Addendum)
 X Refill on azelastine  nasal spray  X Rx for Airfit F30 full face mask to [facility]  X Zpak    VISIT SUMMARY: Today, we discussed your worsening shortness of breath and cough, which have been affecting your daily activities. We also addressed your recent weight gain, colostomy status, and ventral hernia. We made some adjustments to your medications and treatment plan to help manage your symptoms more effectively.  YOUR PLAN: -CHRONIC OBSTRUCTIVE PULMONARY DISEASE (COPD) WITH ACUTE BRONCHITIS AND PRODUCTIVE COUGH: COPD is a chronic lung condition that makes it hard to breathe, and acute bronchitis is an infection in the airways. You have been experiencing a productive cough and wheezing. We prescribed azithromycin  (Z-Pak) to treat the bronchitis, refilled your mesolastine nasal spray for congestion, and advised you to continue using your Breo and Yupelri  inhalers. We also recommended switching to a full face mask for your BiPAP machine to improve comfort and compliance, and to use it every night. Monitor your oxygen  levels and adjust your oxygen  therapy as needed to keep your saturation above 88%.  -FLUID OVERLOAD (EDEMA): Fluid overload means your body is retaining too much fluid, which can cause swelling and weight gain. We noticed a recent weight gain of 5-6 pounds, which is making it harder for you to breathe. We increased your torsemide  to two tablets daily until your weight decreases. Please monitor your weight and adjust your diuretic therapy based on any changes.                        Contains text generated by Abridge.                                 Contains text generated by Abridge.

## 2024-03-31 ENCOUNTER — Ambulatory Visit (HOSPITAL_COMMUNITY)
Admission: RE | Admit: 2024-03-31 | Discharge: 2024-03-31 | Disposition: A | Discharge status: Home / Self Care | Source: Ambulatory Visit | Attending: Gastroenterology | Admitting: Gastroenterology

## 2024-03-31 ENCOUNTER — Ambulatory Visit: Payer: Self-pay | Admitting: Cardiology

## 2024-03-31 DIAGNOSIS — N898 Other specified noninflammatory disorders of vagina: Secondary | ICD-10-CM | POA: Insufficient documentation

## 2024-03-31 DIAGNOSIS — N939 Abnormal uterine and vaginal bleeding, unspecified: Secondary | ICD-10-CM | POA: Insufficient documentation

## 2024-03-31 DIAGNOSIS — K439 Ventral hernia without obstruction or gangrene: Secondary | ICD-10-CM | POA: Insufficient documentation

## 2024-03-31 DIAGNOSIS — N824 Other female intestinal-genital tract fistulae: Secondary | ICD-10-CM | POA: Diagnosis present

## 2024-03-31 DIAGNOSIS — D649 Anemia, unspecified: Secondary | ICD-10-CM | POA: Diagnosis present

## 2024-03-31 MED ORDER — IOHEXOL 300 MG/ML  SOLN
100.0000 mL | Freq: Once | INTRAMUSCULAR | Status: AC | PRN
  Administered 2024-03-31: 100 mL via INTRAVENOUS

## 2024-03-31 MED ORDER — IOHEXOL 9 MG/ML PO SOLN
ORAL | Status: AC
  Filled 2024-03-31: qty 1000

## 2024-03-31 MED ORDER — IOHEXOL 9 MG/ML PO SOLN
1000.0000 mL | ORAL | Status: AC
  Administered 2024-03-31: 1000 mL via ORAL

## 2024-03-31 NOTE — Progress Notes (Signed)
 Remote PPM Transmission

## 2024-03-31 NOTE — Progress Notes (Deleted)
 Remote ICD Transmission

## 2024-04-06 ENCOUNTER — Telehealth: Payer: Self-pay | Admitting: Gastroenterology

## 2024-04-06 NOTE — Telephone Encounter (Signed)
 Kathleen [name] the GYN referral was faxed, but patient is calling back about the referral to Dr Gelene office.

## 2024-04-06 NOTE — Telephone Encounter (Signed)
 Inbound call from patient stating she was referred to Dr.Wakefield and when she called office to be scheduled they stated they never received referral. Patient would like referral to be sent again and to be given a call when referral is sent. Please advise  Thank you

## 2024-04-06 NOTE — Telephone Encounter (Signed)
 Dr Wilhelmenia per your note below you were going to speak with Dr Ebbie. Have you heard anything?  Kathleen [name], Please let the patient know I reviewed her CT scan. Thankfully no evidence of any bowel obstruction is present. She has multiple hernias which was one of our concerns. There is also continued thickening along the bladder and colon, concerning for potential persistent colovesical fistula. I will forward these results to Dr. Ebbie, based on her medical comorbidities, is not clear what else could be potentially offered in regards to hernia repair or further workup and evaluation for the fistula but at this point really will need to see if our surgeons can help clarify next steps. Thankfully no evidence of septic physiology on her last clinic visit or on this scan showing any evidence of problems/abscesses. GM   MW, Thoughts on being able to see patient in followup for her hernias and continued concern for Colovesical fistula? Thanks. GM

## 2024-04-07 NOTE — Telephone Encounter (Signed)
 Referral has been made to [facility] with records faxed  The pt has been advised

## 2024-04-07 NOTE — Telephone Encounter (Signed)
 Patty, I have not heard back from Dr. Ebbie. Go ahead and place referral back to his office. Thanks. GM

## 2024-04-10 ENCOUNTER — Encounter: Payer: Self-pay | Admitting: Radiology

## 2024-04-14 ENCOUNTER — Other Ambulatory Visit (HOSPITAL_COMMUNITY): Payer: Self-pay | Admitting: Cardiology

## 2024-04-18 ENCOUNTER — Other Ambulatory Visit (HOSPITAL_BASED_OUTPATIENT_CLINIC_OR_DEPARTMENT_OTHER): Payer: Self-pay | Admitting: Pulmonary Disease

## 2024-04-18 DIAGNOSIS — J4489 Other specified chronic obstructive pulmonary disease: Secondary | ICD-10-CM

## 2024-04-20 ENCOUNTER — Telehealth: Payer: Self-pay | Admitting: Orthopedic Surgery

## 2024-04-20 NOTE — Telephone Encounter (Signed)
 The one that would fit was injection with hip. Advised patient of this.

## 2024-04-20 NOTE — Telephone Encounter (Signed)
 Put what would fit on a CD.

## 2024-04-20 NOTE — Telephone Encounter (Signed)
 Pt called asking for a call back. Pt states she need her ultra sound injection right shoulder ultra sound injection need to be sent to another provider and unsure of what need to be done. Please call pt at [phone]. Spoke with Tammy and also advise to send to Tinnie FALCON and Katheryn

## 2024-04-24 ENCOUNTER — Ambulatory Visit: Attending: Cardiology

## 2024-04-24 DIAGNOSIS — Z950 Presence of cardiac pacemaker: Secondary | ICD-10-CM

## 2024-04-24 DIAGNOSIS — I5022 Chronic systolic (congestive) heart failure: Secondary | ICD-10-CM | POA: Diagnosis not present

## 2024-04-24 NOTE — Progress Notes (Signed)
 EPIC Encounter for ICM Monitoring  Patient Name: Kathleen [name] is a 78 y.o. female Date: 04/24/2024 Primary Care Physican: Joshua Debby CROME, MD Primary Cardiologist: Bensimhon Electrophysiologist: Camnitz Bi-V Pacing: 100%          04/06/2023 Home Weight: 176 lbs    06/30/2023 Home Weight: 178 lbs 08/02/2023 Home Weight: 172 lbs 09/15/2023   Home Weight: 177 lbs 11/11/2023   Home Weight: 176 lbs 11/17/2023 Home Weight: 182 lbs 11/22/2023 Home Weight: 185 lbs 01/05/2024 Home Weight: 190 lbs  01/17/2024 Home Weight: 183 lbs 02/10/2024 Home Weight: 183 lbs 03/22/2024 Home Weight: 183-184 lbs   Since 28-Mar-2024 Time in AT/AF  0.0 hr/day (0.0%)                                 Spoke with patient and heart failure questions reviewed.  Transmission results reviewed.  Pt asymptomatic for fluid accumulation.  She has bilateral shoulder and hip pain.   She would like to know if she is MRI compatible.    Diet:  Lives in an assisted living and meals are provided which are not low salt.     Since 03/20/2024 ICM Remote Transmission: Optivol thoracic impedance suggesting patterns of 3-4 days with possible fluid accumulation alternating at baseline 1-2 days.  Message sent to device clinic to check if her device is MRI compatible.    Prescribed:  Torsemide  20 mg take 2 tablet(s) (40 mg total) by mouth daily.   Potassium 10 mEq take 4 tablet(s) (40 mEq total) by mouth daily  Spironolactone  25 mg take 1 (25 mg total) tablet by mouth every evening   Labs: 03/24/2024 Creatinine 0.69, BUN 17, Potassium 4.1, Sodium 141 02/08/2024 Creatinine 0.73, BUN 15, Potassium 4.2, Sodium 141, GFR 85  02/01/2024 Creatinine 0.78, BUN 25, Potassium 3.7, Sodium 140  01/19/2024 Creatinine 0.73, BUN 19, Potassium 3.5, Sodium 136  01/11/2024 Creatinine 0.66, BUN 17, Potassium 3.7, Sodium 139, GFR >60  01/10/2024 Creatinine 0.73, BUN 20, Potassium 4.3, Sodium 140, GFR >60  A complete set of results can be found in Results  Review.   Recommendations:  No changes and encouraged to call if experiencing any fluid symptoms.   Follow-up plan: ICM clinic phone appointment on 06/05/2024.  91 day device clinic remote transmission 06/28/2024.     EP/Cardiology Office Visits:  Recall 04/19/2024 with Dr Inocencio.  05/15/2024 with Dr Cherrie.   Copy of ICM check sent to Dr. Inocencio.    Remote monitoring is medically necessary for Heart Failure Management.    Daily Thoracic Impedance ICM trend: 01/25/2024 through 04/24/2024.    12-14 Month Thoracic Impedance ICM trend:     [name], RN 04/24/2024 5:07 PM

## 2024-04-26 NOTE — Progress Notes (Addendum)
 Spoke with patient and she stated she spoke with [employer] and they told her the device was not compatible but could discuss with [employer] Doctor further if needed about compatibility.   Message sent to Delon Sharps Device clinic nurse regarding pt spoke with [employer].  Advised to have the office needing the MRI contact Dr Inocencio office.

## 2024-04-26 NOTE — Progress Notes (Signed)
  Received: [name], Kathleen LABOR, RN  Kathleen [name], Kathleen RAMAN, RN It appears she is. She has had all of her device implants/surgeries at [facility]. It appears first pacemaker was 2015 potentially?  History of Twiddler's syndrome.   RV lead is 2015.  Atrial lead is 2019.  Then they added a left bundle area lead in 2021 when they upgraded to a BIV device.  The device and all the leads are MRI compatible.  Only question would be if they ever abandoned a lead.  I will see if I can find that information.   I read the [facility] notes in Careeverywhere and I do not believe she has an abandoned wires.   Or she could double check with [facility] herself.  The office that wants the MRI has a form we have to complete and fax back to them.  So they can call us  or just fax us  the form. [phone]

## 2024-05-01 ENCOUNTER — Encounter: Payer: Self-pay | Admitting: Sports Medicine

## 2024-05-01 ENCOUNTER — Encounter: Payer: Self-pay | Admitting: Orthopedic Surgery

## 2024-05-02 NOTE — Telephone Encounter (Signed)
 Rosina can you bring her in to see me or Herlene sometime next week if possible.  Thanks

## 2024-05-10 ENCOUNTER — Ambulatory Visit: Admitting: Orthopedic Surgery

## 2024-05-12 ENCOUNTER — Other Ambulatory Visit: Payer: Self-pay | Admitting: Orthopedic Surgery

## 2024-05-12 ENCOUNTER — Encounter: Payer: Self-pay | Admitting: Orthopedic Surgery

## 2024-05-12 ENCOUNTER — Ambulatory Visit: Admitting: Orthopedic Surgery

## 2024-05-12 ENCOUNTER — Other Ambulatory Visit: Payer: Self-pay

## 2024-05-12 DIAGNOSIS — M19011 Primary osteoarthritis, right shoulder: Secondary | ICD-10-CM

## 2024-05-12 DIAGNOSIS — M19012 Primary osteoarthritis, left shoulder: Secondary | ICD-10-CM | POA: Diagnosis not present

## 2024-05-12 NOTE — Progress Notes (Signed)
 Office Visit Note   Patient: Kathleen [name]           Date of Birth: 1945/08/05           MRN: [id number] Visit Date: 05/12/2024 Requested by: Joshua Debby CROME, MD [address] [place],  [place] [address] PCP: Joshua Debby CROME, MD  Subjective: Chief Complaint  Patient presents with   Left Shoulder - Pain    HPI: [name] is a 78 y.o. female who presents to the office reporting both right and left shoulder pain.  She is thinking about stem cells for the right shoulder.  She has had reasonable results with left shoulder injections in the past.  No interval history of injury or trauma..                ROS: All systems reviewed are negative as they relate to the chief complaint within the history of present illness.  Patient denies fevers or chills.  Assessment & Plan: Visit Diagnoses:  1. Primary osteoarthritis, right shoulder     Plan: Impression is left shoulder pain with early arthritis.  Glenohumeral joint injection performed today.  Could consider repeat injection in about 4 months.  She will follow-up with us  as needed.  Stem cells may or may not be beneficial for the severe end-stage arthritis that she has in the right shoulder.  Radiographs provided today for that clinic evaluation for the right shoulder in [facility]  Follow-Up Instructions: No follow-ups on file.   Orders:  No orders of the defined types were placed in this encounter.  No orders of the defined types were placed in this encounter.     Procedures: No procedures performed   Clinical Data: No additional findings.  Objective: Vital Signs: There were no vitals taken for this visit.  Physical Exam:  Constitutional: Patient appears well-developed HEENT:  Head: Normocephalic Eyes:EOM are normal Neck: Normal range of motion Cardiovascular: Normal rate Pulmonary/chest: Effort normal Neurologic: Patient is alert Skin: Skin is warm Psychiatric: Patient has normal mood and affect  Ortho Exam:  Ortho exam demonstrates forward flexion active abduction on the right of about 45 to 50 degrees.  On the left she can get the arm up closer to 90 degrees.  Passive range of motion is maintained above 90 degrees of forward flexion and abduction.  Deltoid fires on the left.  Specialty Comments:  No specialty comments available.  Imaging: No results found.   PMFS History: Patient Active Problem List   Diagnosis Date Noted   Vaginal discharge 03/25/2024   Vaginal bleeding 03/25/2024   Ventral hernia without obstruction or gangrene 03/25/2024   Diverticulitis of large intestine with complication 03/25/2024   Pain of left heel 01/25/2024   Long term (current) use of anticoagulants 12/12/2023   BPPV (benign paroxysmal positional vertigo), bilateral 12/06/2023   BPPV (benign paroxysmal positional vertigo) 11/28/2023   UTI (urinary tract infection) 11/26/2023   Dizziness 11/26/2023   S/P AV nodal ablation 11/26/2023   GERD (gastroesophageal reflux disease) 11/26/2023   Colostomy in place [facility]) 11/26/2023   History of diverticulitis 11/26/2023   Tinea corporis 10/21/2023   Surgical wound, non healing 09/10/2023   Colostomy complication (HCC) 08/18/2023   Irritant contact dermatitis associated with fecal stoma 08/13/2023   Colostomy care (HCC) 08/13/2023   Wound infection 08/04/2023   Dyslipidemia 08/03/2023   Candida esophagitis (HCC) 05/24/2023   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   Pill dysphagia 04/06/2023   COPD  exacerbation (HCC) 03/26/2023   Absolute anemia 03/17/2023   Chronic nausea 03/17/2023   Hypokalemia 02/23/2023   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   Carpal tunnel syndrome, right upper limb 01/27/2023   Hypercoagulable state 11/18/2022   COPD with acute exacerbation (HCC) 10/09/2022   Chronic obstructive pulmonary disease (HCC) 06/28/2022   Palliative care encounter 06/28/2022   Anticoagulated by anticoagulation treatment  06/11/2022   Supplemental oxygen  dependent 06/11/2022   Heart failure with reduced ejection fraction (HCC) 05/14/2022   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   Depression, major, single episode, moderate (HCC) 04/27/2022   Obesity (BMI 30-39.9) 03/18/2022   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   Low TSH level 12/02/2021   Acquired hypothyroidism 11/12/2021   Encounter for palliative care involving management of pain 11/12/2021   Chronic left-sided low back pain 11/04/2021   Gout 10/01/2021   Hyperlipidemia with target LDL less than 100 08/07/2021   Chronic anticoagulation 07/07/2021   Stage 3a chronic kidney disease (HCC) 05/23/2021   Dietary iron deficiency 05/23/2021   Age-related osteoporosis without current pathological fracture 04/22/2021   Type 2 diabetes mellitus with diabetic neuropathy, without long-term current use of insulin  (HCC) 04/22/2021   Sleep apnea 01/22/2021   Physical deconditioning 01/16/2020   Statin intolerance 10/12/2019   Statin myopathy 02/24/2019   Chronic combined systolic and diastolic heart failure (HCC) 04/06/2018   HTN (hypertension) 04/06/2018   COPD (chronic obstructive pulmonary disease) (HCC) 04/06/2018   Persistent atrial fibrillation (HCC) 04/06/2018   Chronic respiratory failure with hypoxia (HCC) 03/28/2018   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 12/02/2015   Protein-calorie malnutrition, moderate 09/11/2015   Past Medical History:  Diagnosis Date   Arthritis    Asthma    Blood transfusion without reported diagnosis 2015   in hospital   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HCC)    Cataract 2016   very small   CHF (congestive heart failure) (HCC)    Chronic renal insufficiency    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    Concussion 10/04/2021   COPD (chronic obstructive pulmonary disease) (HCC)    Diverticulitis    Emphysema of lung (HCC) 2013   lung collapsed   Gout    Hypertension     Longstanding persistent atrial fibrillation (HCC)    on Xarelto    Nonischemic cardiomyopathy (HCC)    Obesity    Oxygen  deficiency 2013   Pacemaker    Spontaneous pneumothorax 2013   Thyroid  disease 1990    Family History  Problem Relation Age of Onset   Breast cancer Mother 40       3 different times   Pancreatic cancer Mother    Emphysema Father    Healthy Brother    Breast cancer Cousin    Valvular heart disease Son    Obesity Daughter    Colon cancer Neg Hx    Esophageal cancer Neg Hx    Stomach cancer Neg Hx    Inflammatory bowel disease Neg Hx    Liver disease Neg Hx    Rectal cancer Neg Hx     Past Surgical History:  Procedure Laterality Date   ABDOMINAL HYSTERECTOMY     APPENDECTOMY     APPLICATION OF WOUND VAC  04/27/2023   Procedure: APPLICATION OF WOUND VAC;  Surgeon: Ebbie Cough, MD;  Location: [facility] OR;  Service: General;;   ATRIAL FIBRILLATION ABLATION  07/20/2013   by Dr Pierrette   AV nodal ablation  11/01/2013   by Dr Pierrette, repeated  by Dr Rosine   BREAST BIOPSY Bilateral 1997   negative   CARDIAC CATHETERIZATION     CESAREAN SECTION  1976   CHOLECYSTECTOMY     COLON RESECTION SIGMOID N/A 04/27/2023   Procedure: COLON RESECTION SIGMOID WITH COLOSTOMY;  Surgeon: Ebbie Cough, MD;  Location[facility] OR;  Service: General;  Laterality: N/A;   COLON SURGERY  2024   ESOPHAGOGASTRODUODENOSCOPY (EGD) WITH PROPOFOL  N/A 05/24/2023   Procedure: ESOPHAGOGASTRODUODENOSCOPY (EGD) WITH PROPOFOL ;  Surgeon: Albertus Gordy HERO, MD;  Location: [facility] ENDOSCOPY;  Service: Gastroenterology;  Laterality: N/A;   HERNIA REPAIR     LAPAROTOMY N/A 04/27/2023   Procedure: EXPLORATORY LAPAROTOMY;  Surgeon: Ebbie Cough, MD;  Location: [facility] OR;  Service: General;  Laterality: N/A;   PACEMAKER INSERTION  06/2017   MDT Viva CRT-P implanted by Dr Pierrette after AV nodal ablation,  LV lead could not be placed   PACEMAKER INSERTION  05/2020   with lead bundle   RIGHT/LEFT HEART  CATH AND CORONARY ANGIOGRAPHY N/A 03/20/2022   Procedure: RIGHT/LEFT HEART CATH AND CORONARY ANGIOGRAPHY;  Surgeon: Jordan, Peter M, MD;  Location: [facility] INVASIVE CV LAB;  Service: Cardiovascular;  Laterality: N/A;   TUBAL LIGATION  1976   Social History   Occupational History   Occupation: retired  Tobacco Use   Smoking status: Former    Current packs/day: 0.00    Average packs/day: 1 pack/day for 20.0 years (20.0 ttl pk-yrs)    Types: Cigarettes    Start date: 06/09/1967    Quit date: 06/09/1987    Years since quitting: 36.9    Passive exposure: Past   Smokeless tobacco: Never   Tobacco comments:    Former smoker 12/25/21  Vaping Use   Vaping status: Never Used  Substance and Sexual Activity   Alcohol use: No    Alcohol/week: 0.0 standard drinks of alcohol   Drug use: No   Sexual activity: Not Currently

## 2024-05-15 ENCOUNTER — Encounter (HOSPITAL_COMMUNITY): Payer: Self-pay

## 2024-05-15 ENCOUNTER — Encounter (HOSPITAL_COMMUNITY): Admitting: Internal Medicine

## 2024-05-17 ENCOUNTER — Encounter: Payer: Self-pay | Admitting: Sports Medicine

## 2024-05-17 ENCOUNTER — Ambulatory Visit: Admitting: Orthopedic Surgery

## 2024-05-17 ENCOUNTER — Ambulatory Visit: Admitting: Sports Medicine

## 2024-05-17 DIAGNOSIS — M76899 Other specified enthesopathies of unspecified lower limb, excluding foot: Secondary | ICD-10-CM

## 2024-05-17 DIAGNOSIS — M7918 Myalgia, other site: Secondary | ICD-10-CM | POA: Diagnosis not present

## 2024-05-17 NOTE — Progress Notes (Signed)
 Kathleen [name] - 78 y.o. female MRN [id number]  Date of birth: [birth year]  Office Visit Note: Visit Date: 05/17/2024 PCP: Kathleen Debby CROME, MD Referred by: Kathleen Debby CROME, MD  Subjective: Chief Complaint  Patient presents with   Right Hip - Pain   HPI: Kathleen [name] is a pleasant 78 y.o. female who presents today for acute on chronic right posterior hip/proximal hamstring pain.  Kathleen [name] has been dealing with sided posterior hip/buttock pain with insertion of the hamstring tendons.  We did perform ultrasound-guided proximal hamstring/ischial bursa injection back on 03/07/2024.  She received good relief for this but only for a couple hours and she did not receive long-lasting pain.  She is here to discuss possible shockwave another conservative treatment options.  For pain control, she is managed on Percocet 5-325 mg every 8 hours as needed.   Pertinent ROS were reviewed with the patient and found to be negative unless otherwise specified above in HPI.   Assessment & Plan: Visit Diagnoses:  1. Hamstring tendinitis at origin   2. Right buttock pain    Plan: Impression is acute on chronic right posterior buttock pain near the ischial bursa and insertional hamstring.  Her exam suggest significant insertional hamstring tendinopathy versus a degree of tearing.  Through shared decision making, we did proceed with our first trial of extracorporeal shockwave therapy.  I would like to perform a second additional trial and then after 2 treatments see what sort of cumulative benefit she is receiving before discussing additional treatment options.   If she does have high-grade tearing, she would not be a surgical candidate.  She did bring in a form from [facility] that states her pacemaker is MRI compatible.  We may consider obtaining MRI of the pelvis to further evaluate if not finding relief from shockwave.  For her chronic pain, she may continue her oxycodone -acetaminophen  5-325 mg every 8 hours as  needed for pain.  Follow-up: Return in about 8 days (around 05/25/2024).   Meds & Orders: No orders of the defined types were placed in this encounter.  No orders of the defined types were placed in this encounter.    Procedures: Procedure: ECSWT Indications:  Proximal hamstring tendinopathy   Procedure Details Consent: Risks of procedure as well as the alternatives and risks of each were explained to the patient.  Verbal consent for procedure obtained. Time Out: Verified patient identification, verified procedure, site was marked, verified correct patient position. The area was cleaned with alcohol swab.     The right ischial bursa and proximal hamstring was targeted for Extracorporeal shockwave therapy.    Preset: Muscular injury/Tendinopathy Power Level: 110 mJ Frequency: 12 Hz Impulse/cycles: 2400 Head size: Regular   Patient tolerated procedure well without immediate complications.         Clinical History: No specialty comments available.  She reports that she quit smoking about 36 years ago. Her smoking use included cigarettes. She started smoking about 56 years ago. She has a 20 pack-year smoking history. She has been exposed to tobacco smoke. She has never used smokeless tobacco.  Recent Labs    05/24/23 0334 06/30/23 1122 01/25/24 0000  HGBA1C  --  5.9  --   LABURIC 8.6*  --  6.6    Objective:   Physical Exam  Gen: Well-appearing, in no acute distress; non-toxic CV: Well-perfused. Warm.  Resp: Breathing unlabored on room air; no wheezing. Psych: Fluid speech in conversation; appropriate affect; normal thought process  Ortho Exam -  Right hip/buttock: + TTP around the region of the ischial bursa and the proximal hamstring origin.  No redness swelling or effusion here.  Patient ambulates with the assistance of a rolling walker.  Imaging: No results found.  Past Medical/Family/Surgical/Social History: Medications & Allergies reviewed per EMR, new  medications updated. Patient Active Problem List   Diagnosis Date Noted   Vaginal discharge 03/25/2024   Vaginal bleeding 03/25/2024   Ventral hernia without obstruction or gangrene 03/25/2024   Diverticulitis of large intestine with complication 03/25/2024   Pain of left heel 01/25/2024   Long term (current) use of anticoagulants 12/12/2023   BPPV (benign paroxysmal positional vertigo), bilateral 12/06/2023   BPPV (benign paroxysmal positional vertigo) 11/28/2023   UTI (urinary tract infection) 11/26/2023   Dizziness 11/26/2023   S/P AV nodal ablation 11/26/2023   GERD (gastroesophageal reflux disease) 11/26/2023   Colostomy in place [facility]) 11/26/2023   History of diverticulitis 11/26/2023   Tinea corporis 10/21/2023   Surgical wound, non healing 09/10/2023   Colostomy complication (HCC) 08/18/2023   Irritant contact dermatitis associated with fecal stoma 08/13/2023   Colostomy care (HCC) 08/13/2023   Wound infection 08/04/2023   Dyslipidemia 08/03/2023   Candida esophagitis (HCC) 05/24/2023   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   Pill dysphagia 04/06/2023   COPD exacerbation (HCC) 03/26/2023   Absolute anemia 03/17/2023   Chronic nausea 03/17/2023   Hypokalemia 02/23/2023   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   Carpal tunnel syndrome, right upper limb 01/27/2023   Hypercoagulable state 11/18/2022   COPD with acute exacerbation (HCC) 10/09/2022   Chronic obstructive pulmonary disease (HCC) 06/28/2022   Palliative care encounter 06/28/2022   Anticoagulated by anticoagulation treatment 06/11/2022   Supplemental oxygen  dependent 06/11/2022   Heart failure with reduced ejection fraction (HCC) 05/14/2022   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   Depression, major, single episode, moderate (HCC) 04/27/2022   Obesity (BMI 30-39.9) 03/18/2022   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   Low TSH level 12/02/2021   Acquired hypothyroidism 11/12/2021    Encounter for palliative care involving management of pain 11/12/2021   Chronic left-sided low back pain 11/04/2021   Gout 10/01/2021   Hyperlipidemia with target LDL less than 100 08/07/2021   Chronic anticoagulation 07/07/2021   Stage 3a chronic kidney disease (HCC) 05/23/2021   Dietary iron deficiency 05/23/2021   Age-related osteoporosis without current pathological fracture 04/22/2021   Type 2 diabetes mellitus with diabetic neuropathy, without long-term current use of insulin  (HCC) 04/22/2021   Sleep apnea 01/22/2021   Physical deconditioning 01/16/2020   Statin intolerance 10/12/2019   Statin myopathy 02/24/2019   Chronic combined systolic and diastolic heart failure (HCC) 04/06/2018   HTN (hypertension) 04/06/2018   COPD (chronic obstructive pulmonary disease) (HCC) 04/06/2018   Persistent atrial fibrillation (HCC) 04/06/2018   Chronic respiratory failure with hypoxia (HCC) 03/28/2018   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 12/02/2015   Protein-calorie malnutrition, moderate 09/11/2015   Past Medical History:  Diagnosis Date   Arthritis    Asthma    Blood transfusion without reported diagnosis 2015   in hospital   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HCC)    Cataract 2016   very small   CHF (congestive heart failure) (HCC)    Chronic renal insufficiency    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    Concussion 10/04/2021   COPD (chronic obstructive pulmonary disease) (HCC)    Diverticulitis    Emphysema of lung (HCC) 2013   lung  collapsed   Gout    Hypertension    Longstanding persistent atrial fibrillation (HCC)    on Xarelto    Nonischemic cardiomyopathy (HCC)    Obesity    Oxygen  deficiency 2013   Pacemaker    Spontaneous pneumothorax 2013   Thyroid  disease 1990   Family History  Problem Relation Age of Onset   Breast cancer Mother 94       3 different times   Pancreatic cancer Mother    Emphysema  Father    Healthy Brother    Breast cancer Cousin    Valvular heart disease Son    Obesity Daughter    Colon cancer Neg Hx    Esophageal cancer Neg Hx    Stomach cancer Neg Hx    Inflammatory bowel disease Neg Hx    Liver disease Neg Hx    Rectal cancer Neg Hx    Past Surgical History:  Procedure Laterality Date   ABDOMINAL HYSTERECTOMY     APPENDECTOMY     APPLICATION OF WOUND VAC  04/27/2023   Procedure: APPLICATION OF WOUND VAC;  Surgeon: Ebbie Cough, MD;  Location: [facility] OR;  Service: General;;   ATRIAL FIBRILLATION ABLATION  07/20/2013   by Dr Pierrette   AV nodal ablation  11/01/2013   by Dr Pierrette, repeated by Dr Rosine   BREAST BIOPSY Bilateral 1997   negative   CARDIAC CATHETERIZATION     CESAREAN SECTION  1976   CHOLECYSTECTOMY     COLON RESECTION SIGMOID N/A 04/27/2023   Procedure: COLON RESECTION SIGMOID WITH COLOSTOMY;  Surgeon: Ebbie Cough, MD;  Location: [facility] OR;  Service: General;  Laterality: N/A;   COLON SURGERY  2024   ESOPHAGOGASTRODUODENOSCOPY (EGD) WITH PROPOFOL  N/A 05/24/2023   Procedure: ESOPHAGOGASTRODUODENOSCOPY (EGD) WITH PROPOFOL ;  Surgeon: Albertus Gordy HERO, MD;  Location: [facility] ENDOSCOPY;  Service: Gastroenterology;  Laterality: N/A;   HERNIA REPAIR     LAPAROTOMY N/A 04/27/2023   Procedure: EXPLORATORY LAPAROTOMY;  Surgeon: Ebbie Cough, MD;  Location[facility] OR;  Service: General;  Laterality: N/A;   PACEMAKER INSERTION  06/2017   MDT Viva CRT-P implanted by Dr Pierrette after AV nodal ablation,  LV lead could not be placed   PACEMAKER INSERTION  05/2020   with lead bundle   RIGHT/LEFT HEART CATH AND CORONARY ANGIOGRAPHY N/A 03/20/2022   Procedure: RIGHT/LEFT HEART CATH AND CORONARY ANGIOGRAPHY;  Surgeon: Jordan, Peter M, MD;  Location: [facility] INVASIVE CV LAB;  Service: Cardiovascular;  Laterality: N/A;   TUBAL LIGATION  1976   Social History   Occupational History   Occupation: retired  Tobacco Use   Smoking status: Former    Current  packs/day: 0.00    Average packs/day: 1 pack/day for 20.0 years (20.0 ttl pk-yrs)    Types: Cigarettes    Start date: 06/09/1967    Quit date: 06/09/1987    Years since quitting: 36.9    Passive exposure: Past   Smokeless tobacco: Never   Tobacco comments:    Former smoker 12/25/21  Vaping Use   Vaping status: Never Used  Substance and Sexual Activity   Alcohol use: No    Alcohol/week: 0.0 standard drinks of alcohol   Drug use: No   Sexual activity: Not Currently

## 2024-05-17 NOTE — Progress Notes (Signed)
 Patient says that she got about 2 hours of relief from the injection the day that she had it, but otherwise did not get any longer term relief. She says that her pain feels the same, and even a bit worse. She is here to try shockwave therapy today.

## 2024-05-24 ENCOUNTER — Encounter (HOSPITAL_BASED_OUTPATIENT_CLINIC_OR_DEPARTMENT_OTHER): Payer: Self-pay | Admitting: Pulmonary Disease

## 2024-05-25 ENCOUNTER — Ambulatory Visit: Admitting: Sports Medicine

## 2024-05-25 ENCOUNTER — Encounter: Payer: Self-pay | Admitting: Sports Medicine

## 2024-05-25 DIAGNOSIS — M67952 Unspecified disorder of synovium and tendon, left thigh: Secondary | ICD-10-CM

## 2024-05-25 DIAGNOSIS — M7918 Myalgia, other site: Secondary | ICD-10-CM | POA: Diagnosis not present

## 2024-05-25 DIAGNOSIS — M76899 Other specified enthesopathies of unspecified lower limb, excluding foot: Secondary | ICD-10-CM

## 2024-05-25 NOTE — Progress Notes (Signed)
 Patient says that she felt about 25% better after the first shockwave treatment until yesterday. She denies having any significant soreness or tenderness after the first session. She is here for repeat shockwave therapy today.

## 2024-05-25 NOTE — Progress Notes (Signed)
 Kathleen [name] - 78 y.o. female MRN [id number]  Date of birth: 09-26-1945  Office Visit Note: Visit Date: 05/25/2024 PCP: Joshua Debby CROME, MD Referred by: Joshua Debby CROME, MD  Subjective: Chief Complaint  Patient presents with   Right Hip - Follow-up   HPI: Kathleen [name] is a pleasant 78 y.o. female who presents today for f/u of  acute on chronic right posterior hip/proximal hamstring pain.   Rhoda reports she felt about 25% better after the first shockwave treatment until yesterday. She denies having any significant soreness or tenderness after the first session.  She did have a few days where she really did not notice the pain which was encouraging for her.  She is asking about other treatment options that are available. For pain control, she is managed on Percocet 5-325 mg every 8 hours as needed.   Pertinent ROS were reviewed with the patient and found to be negative unless otherwise specified above in HPI.   Assessment & Plan: Visit Diagnoses:  1. Hamstring tendinitis at origin   2. Right buttock pain   3. Tendinopathy of left gluteal region    Plan: Impression is chronic right posterior hip/buttock pain with likely insertional hamstring tendinopathy with a degree of partial to high-grade tearing.  She also has associated gluteal tendinopathy and hip abduction insufficiency.  Last week we did proceed with our first trial of extracorporeal shockwave therapy which she noted a positive response to, we did repeat our second trial today.  I would like to see over the next 2-3 weeks to see what degree of relief she receives.  We also discussed considering trigger point injections for the gluteal musculature given this is compensatory of her underlying hamstring tendinopathy/tearing.  We could consider additional shockwave only visits going forward if finding beneficial.  For this and her chronic pain, she may continue her oxycodone -acetaminophen  5-325 mg every 8 hours as needed.  We did  discuss other conservative treatment options.  Did discuss SAM device for her -she is interested in considering this if insurance will cover.  I will reach out to our rep's, Aleck and Bernardino to see if they can contact her to discuss details.  Follow-up: Return in about 3-wk f/u for R-post hip pain   Meds & Orders: No orders of the defined types were placed in this encounter.  No orders of the defined types were placed in this encounter.    Procedures:  Procedure: ECSWT Indications:  Proximal hamstring tendinopathy   Procedure Details Consent: Risks of procedure as well as the alternatives and risks of each were explained to the patient.  Verbal consent for procedure obtained. Time Out: Verified patient identification, verified procedure, site was marked, verified correct patient position. The area was cleaned with alcohol swab.     The right ischial bursa and proximal hamstring was targeted for Extracorporeal shockwave therapy.    Preset: Muscular injury/Tendinopathy Power Level: 120 mJ Frequency: 13 Hz Impulse/cycles: 2400 Head size: Regular   Patient tolerated procedure well without immediate complications.       Clinical History: No specialty comments available.  She reports that she quit smoking about 36 years ago. Her smoking use included cigarettes. She started smoking about 57 years ago. She has a 20 pack-year smoking history. She has been exposed to tobacco smoke. She has never used smokeless tobacco.  Recent Labs    06/30/23 1122 01/25/24 0000  HGBA1C 5.9  --   LABURIC  --  6.6  Objective:    Physical Exam  Gen: Well-appearing, in no acute distress; non-toxic CV: Well-perfused. Warm.  Resp: Breathing unlabored on room air; no wheezing. Psych: Fluid speech in conversation; appropriate affect; normal thought process  Ortho Exam - Buttock/Hips: + TTP over the gluteal musculature as well as at the origin of the insertion of the  proximal hamstring.  No  significant redness swelling or effusion here.  She does have a degree of lower leg weakness as she ambulates with the assistance of a rolling walker.  Imaging: No results found.  Past Medical/Family/Surgical/Social History: Medications & Allergies reviewed per EMR, new medications updated. Patient Active Problem List   Diagnosis Date Noted   Vaginal discharge 03/25/2024   Vaginal bleeding 03/25/2024   Ventral hernia without obstruction or gangrene 03/25/2024   Diverticulitis of large intestine with complication 03/25/2024   Pain of left heel 01/25/2024   Long term (current) use of anticoagulants 12/12/2023   BPPV (benign paroxysmal positional vertigo), bilateral 12/06/2023   BPPV (benign paroxysmal positional vertigo) 11/28/2023   UTI (urinary tract infection) 11/26/2023   Dizziness 11/26/2023   S/P AV nodal ablation 11/26/2023   GERD (gastroesophageal reflux disease) 11/26/2023   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/26/2023   History of diverticulitis 11/26/2023   Tinea corporis 10/21/2023   Surgical wound, non healing 09/10/2023   Colostomy complication (HCC) 08/18/2023   Irritant contact dermatitis associated with fecal stoma 08/13/2023   Colostomy care (HCC) 08/13/2023   Wound infection 08/04/2023   Dyslipidemia 08/03/2023   Candida esophagitis (HCC) 05/24/2023   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   Pill dysphagia 04/06/2023   COPD exacerbation (HCC) 03/26/2023   Absolute anemia 03/17/2023   Chronic nausea 03/17/2023   Hypokalemia 02/23/2023   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   Carpal tunnel syndrome, right upper limb 01/27/2023   Hypercoagulable state 11/18/2022   COPD with acute exacerbation (HCC) 10/09/2022   Chronic obstructive pulmonary disease (HCC) 06/28/2022   Palliative care encounter 06/28/2022   Anticoagulated by anticoagulation treatment 06/11/2022   Supplemental oxygen  dependent 06/11/2022   Heart failure with reduced  ejection fraction (HCC) 05/14/2022   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   Depression, major, single episode, moderate (HCC) 04/27/2022   Obesity (BMI 30-39.9) 03/18/2022   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   Low TSH level 12/02/2021   Acquired hypothyroidism 11/12/2021   Encounter for palliative care involving management of pain 11/12/2021   Chronic left-sided low back pain 11/04/2021   Gout 10/01/2021   Hyperlipidemia with target LDL less than 100 08/07/2021   Chronic anticoagulation 07/07/2021   Stage 3a chronic kidney disease (HCC) 05/23/2021   Dietary iron  deficiency 05/23/2021   Age-related osteoporosis without current pathological fracture 04/22/2021   Type 2 diabetes mellitus with diabetic neuropathy, without long-term current use of insulin  (HCC) 04/22/2021   Sleep apnea 01/22/2021   Physical deconditioning 01/16/2020   Statin intolerance 10/12/2019   Statin myopathy 02/24/2019   Chronic combined systolic and diastolic heart failure (HCC) 04/06/2018   HTN (hypertension) 04/06/2018   COPD (chronic obstructive pulmonary disease) (HCC) 04/06/2018   Persistent atrial fibrillation (HCC) 04/06/2018   Chronic respiratory failure with hypoxia (HCC) 03/28/2018   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 12/02/2015   Protein-calorie malnutrition, moderate 09/11/2015   Past Medical History:  Diagnosis Date   Arthritis    Asthma    Blood transfusion without reported diagnosis 2015   in hospital   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HCC)    Cataract 2016  very small   CHF (congestive heart failure) (HCC)    Chronic renal insufficiency    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    Concussion 10/04/2021   COPD (chronic obstructive pulmonary disease) (HCC)    Diverticulitis    Emphysema of lung (HCC) 2013   lung collapsed   Gout    Hypertension    Longstanding persistent atrial fibrillation (HCC)    on Xarelto    Nonischemic  cardiomyopathy (HCC)    Obesity    Oxygen  deficiency 2013   Pacemaker    Spontaneous pneumothorax 2013   Thyroid  disease 1990   Family History  Problem Relation Age of Onset   Breast cancer Mother 49       3 different times   Pancreatic cancer Mother    Emphysema Father    Healthy Brother    Breast cancer Cousin    Valvular heart disease Son    Obesity Daughter    Colon cancer Neg Hx    Esophageal cancer Neg Hx    Stomach cancer Neg Hx    Inflammatory bowel disease Neg Hx    Liver disease Neg Hx    Rectal cancer Neg Hx    Past Surgical History:  Procedure Laterality Date   ABDOMINAL HYSTERECTOMY     APPENDECTOMY     APPLICATION OF WOUND VAC  04/27/2023   Procedure: APPLICATION OF WOUND VAC;  Surgeon: Ebbie Cough, MD;  Location: [facility] OR;  Service: General;;   ATRIAL FIBRILLATION ABLATION  07/20/2013   by Dr Pierrette   AV nodal ablation  11/01/2013   by Dr Pierrette, repeated by Dr Rosine   BREAST BIOPSY Bilateral 1997   negative   CARDIAC CATHETERIZATION     CESAREAN SECTION  1976   CHOLECYSTECTOMY     COLON RESECTION SIGMOID N/A 04/27/2023   Procedure: COLON RESECTION SIGMOID WITH COLOSTOMY;  Surgeon: Ebbie Cough, MD;  Location: [facility] OR;  Service: General;  Laterality: N/A;   COLON SURGERY  2024   ESOPHAGOGASTRODUODENOSCOPY (EGD) WITH PROPOFOL  N/A 05/24/2023   Procedure: ESOPHAGOGASTRODUODENOSCOPY (EGD) WITH PROPOFOL ;  Surgeon: Albertus Gordy HERO, MD;  Location: [facility] ENDOSCOPY;  Service: Gastroenterology;  Laterality: N/A;   HERNIA REPAIR     LAPAROTOMY N/A 04/27/2023   Procedure: EXPLORATORY LAPAROTOMY;  Surgeon: Ebbie Cough, MD;  Location: [facility] OR;  Service: General;  Laterality: N/A;   PACEMAKER INSERTION  06/2017   MDT Viva CRT-P implanted by Dr Pierrette after AV nodal ablation,  LV lead could not be placed   PACEMAKER INSERTION  05/2020   with lead bundle   RIGHT/LEFT HEART CATH AND CORONARY ANGIOGRAPHY N/A 03/20/2022   Procedure: RIGHT/LEFT HEART CATH AND  CORONARY ANGIOGRAPHY;  Surgeon: Jordan, Peter M, MD;  Location: [facility] INVASIVE CV LAB;  Service: Cardiovascular;  Laterality: N/A;   TUBAL LIGATION  1976   Social History   Occupational History   Occupation: retired  Tobacco Use   Smoking status: Former    Current packs/day: 0.00    Average packs/day: 1 pack/day for 20.0 years (20.0 ttl pk-yrs)    Types: Cigarettes    Start date: 06/09/1967    Quit date: 06/09/1987    Years since quitting: 36.9    Passive exposure: Past   Smokeless tobacco: Never   Tobacco comments:    Former smoker 12/25/21  Vaping Use   Vaping status: Never Used  Substance and Sexual Activity   Alcohol use: No    Alcohol/week: 0.0 standard drinks of alcohol  Drug use: No   Sexual activity: Not Currently

## 2024-05-25 NOTE — Telephone Encounter (Signed)
 Spoke with pt and relayed the following information:Spoke with Melissa from [employer], who stated that the patient transitioned from renting BIPAP to purchasing it in May 2024. The patient has not been wearing her BIPAP; the last recorded use was on 03/28/24, for four hours and eight minutes. Per insurance requirements, the patient must use the BIPAP for 21 consecutive days to meet compliance. The order for a new mask will be canceled at this time.   She is going to try to wear her BIPAP and will look online into ordering the one we ordered to [employer]

## 2024-05-29 ENCOUNTER — Other Ambulatory Visit: Payer: Self-pay

## 2024-05-29 ENCOUNTER — Emergency Department (HOSPITAL_BASED_OUTPATIENT_CLINIC_OR_DEPARTMENT_OTHER)

## 2024-05-29 ENCOUNTER — Encounter (HOSPITAL_BASED_OUTPATIENT_CLINIC_OR_DEPARTMENT_OTHER): Payer: Self-pay | Admitting: Emergency Medicine

## 2024-05-29 ENCOUNTER — Inpatient Hospital Stay (HOSPITAL_BASED_OUTPATIENT_CLINIC_OR_DEPARTMENT_OTHER)
Admission: EM | Admit: 2024-05-29 | Discharge: 2024-06-02 | DRG: 191 | Disposition: A | Discharge status: Home Health | Attending: Student | Admitting: Student

## 2024-05-29 ENCOUNTER — Emergency Department (HOSPITAL_BASED_OUTPATIENT_CLINIC_OR_DEPARTMENT_OTHER): Admitting: Radiology

## 2024-05-29 DIAGNOSIS — N1831 Chronic kidney disease, stage 3a: Secondary | ICD-10-CM | POA: Diagnosis present

## 2024-05-29 DIAGNOSIS — Z7901 Long term (current) use of anticoagulants: Secondary | ICD-10-CM

## 2024-05-29 DIAGNOSIS — Z933 Colostomy status: Secondary | ICD-10-CM

## 2024-05-29 DIAGNOSIS — E66812 Obesity, class 2: Secondary | ICD-10-CM | POA: Diagnosis present

## 2024-05-29 DIAGNOSIS — Z79899 Other long term (current) drug therapy: Secondary | ICD-10-CM

## 2024-05-29 DIAGNOSIS — I130 Hypertensive heart and chronic kidney disease with heart failure and stage 1 through stage 4 chronic kidney disease, or unspecified chronic kidney disease: Secondary | ICD-10-CM | POA: Diagnosis present

## 2024-05-29 DIAGNOSIS — J101 Influenza due to other identified influenza virus with other respiratory manifestations: Principal | ICD-10-CM | POA: Diagnosis present

## 2024-05-29 DIAGNOSIS — J439 Emphysema, unspecified: Secondary | ICD-10-CM | POA: Diagnosis present

## 2024-05-29 DIAGNOSIS — Z9981 Dependence on supplemental oxygen: Secondary | ICD-10-CM

## 2024-05-29 DIAGNOSIS — I4811 Longstanding persistent atrial fibrillation: Secondary | ICD-10-CM | POA: Diagnosis present

## 2024-05-29 DIAGNOSIS — J9611 Chronic respiratory failure with hypoxia: Secondary | ICD-10-CM | POA: Diagnosis present

## 2024-05-29 DIAGNOSIS — Z803 Family history of malignant neoplasm of breast: Secondary | ICD-10-CM

## 2024-05-29 DIAGNOSIS — Z888 Allergy status to other drugs, medicaments and biological substances status: Secondary | ICD-10-CM

## 2024-05-29 DIAGNOSIS — Z91048 Other nonmedicinal substance allergy status: Secondary | ICD-10-CM

## 2024-05-29 DIAGNOSIS — G8929 Other chronic pain: Secondary | ICD-10-CM | POA: Diagnosis present

## 2024-05-29 DIAGNOSIS — Z90710 Acquired absence of both cervix and uterus: Secondary | ICD-10-CM

## 2024-05-29 DIAGNOSIS — Z881 Allergy status to other antibiotic agents status: Secondary | ICD-10-CM

## 2024-05-29 DIAGNOSIS — Z887 Allergy status to serum and vaccine status: Secondary | ICD-10-CM

## 2024-05-29 DIAGNOSIS — Z825 Family history of asthma and other chronic lower respiratory diseases: Secondary | ICD-10-CM

## 2024-05-29 DIAGNOSIS — Z1152 Encounter for screening for COVID-19: Secondary | ICD-10-CM

## 2024-05-29 DIAGNOSIS — I5032 Chronic diastolic (congestive) heart failure: Secondary | ICD-10-CM | POA: Diagnosis present

## 2024-05-29 DIAGNOSIS — Z87891 Personal history of nicotine dependence: Secondary | ICD-10-CM

## 2024-05-29 DIAGNOSIS — R0602 Shortness of breath: Principal | ICD-10-CM

## 2024-05-29 DIAGNOSIS — M25519 Pain in unspecified shoulder: Secondary | ICD-10-CM | POA: Diagnosis present

## 2024-05-29 DIAGNOSIS — Z800 Family history of malignant neoplasm of digestive organs: Secondary | ICD-10-CM

## 2024-05-29 DIAGNOSIS — J441 Chronic obstructive pulmonary disease with (acute) exacerbation: Principal | ICD-10-CM | POA: Diagnosis present

## 2024-05-29 DIAGNOSIS — R531 Weakness: Secondary | ICD-10-CM | POA: Diagnosis present

## 2024-05-29 DIAGNOSIS — Z6836 Body mass index (BMI) 36.0-36.9, adult: Secondary | ICD-10-CM

## 2024-05-29 DIAGNOSIS — M549 Dorsalgia, unspecified: Secondary | ICD-10-CM | POA: Diagnosis present

## 2024-05-29 DIAGNOSIS — I428 Other cardiomyopathies: Secondary | ICD-10-CM | POA: Diagnosis present

## 2024-05-29 DIAGNOSIS — Z950 Presence of cardiac pacemaker: Secondary | ICD-10-CM

## 2024-05-29 LAB — RESP PANEL BY RT-PCR (RSV, FLU A&B, COVID)  RVPGX2
Influenza A by PCR: POSITIVE — AB
Influenza B by PCR: NEGATIVE
Resp Syncytial Virus by PCR: NEGATIVE
SARS Coronavirus 2 by RT PCR: NEGATIVE

## 2024-05-29 LAB — CBC
HCT: 39.5 % (ref 36.0–46.0)
Hemoglobin: 12.6 g/dL (ref 12.0–15.0)
MCH: 27.4 pg (ref 26.0–34.0)
MCHC: 31.9 g/dL (ref 30.0–36.0)
MCV: 85.9 fL (ref 80.0–100.0)
Platelets: 161 K/uL (ref 150–400)
RBC: 4.6 MIL/uL (ref 3.87–5.11)
RDW: 13.9 % (ref 11.5–15.5)
WBC: 7 K/uL (ref 4.0–10.5)
nRBC: 0 % (ref 0.0–0.2)

## 2024-05-29 LAB — BASIC METABOLIC PANEL WITH GFR
Anion gap: 10 (ref 5–15)
BUN: 12 mg/dL (ref 8–23)
CO2: 26 mmol/L (ref 22–32)
Calcium: 10 mg/dL (ref 8.9–10.3)
Chloride: 104 mmol/L (ref 98–111)
Creatinine, Ser: 0.71 mg/dL (ref 0.44–1.00)
GFR, Estimated: >60 mL/min
Glucose, Bld: 120 mg/dL — ABNORMAL HIGH (ref 70–99)
Potassium: 4.5 mmol/L (ref 3.5–5.1)
Sodium: 140 mmol/L (ref 135–145)

## 2024-05-29 MED ORDER — OSELTAMIVIR PHOSPHATE 75 MG PO CAPS
75.0000 mg | ORAL_CAPSULE | Freq: Two times a day (BID) | ORAL | Status: DC
  Administered 2024-05-29: 75 mg via ORAL
  Filled 2024-05-29 (×2): qty 1

## 2024-05-29 MED ORDER — IOHEXOL 350 MG/ML SOLN
100.0000 mL | Freq: Once | INTRAVENOUS | Status: AC | PRN
  Administered 2024-05-29: 80 mL via INTRAVENOUS

## 2024-05-29 MED ORDER — ACETAMINOPHEN 325 MG PO TABS
650.0000 mg | ORAL_TABLET | Freq: Once | ORAL | Status: AC
  Administered 2024-05-29: 650 mg via ORAL
  Filled 2024-05-29: qty 2

## 2024-05-29 MED ORDER — IPRATROPIUM-ALBUTEROL 0.5-2.5 (3) MG/3ML IN SOLN
3.0000 mL | Freq: Once | RESPIRATORY_TRACT | Status: AC
  Administered 2024-05-29: 3 mL via RESPIRATORY_TRACT
  Filled 2024-05-29: qty 3

## 2024-05-29 MED ORDER — IBUPROFEN 400 MG PO TABS: 600.0000 mg | ORAL_TABLET | Freq: Once | ORAL | Status: DC

## 2024-05-29 NOTE — ED Triage Notes (Signed)
 Pt bib wheelchair, uses o2 at 5L baselineat home. Pt c/o shob starting last night, reports increase in o2 need. Pt endorses cough for a few days with fever

## 2024-05-29 NOTE — ED Provider Notes (Signed)
" °  Physical Exam   Vitals:   05/29/24 1700 05/29/24 1715 05/29/24 1730 05/29/24 1745  BP: (!) 134/59 115/71 122/70 (!) 121/57  Pulse: 71 73 76 69  Resp:    20  Temp:      TempSrc:      SpO2: 97% 98% 98% 98%  Weight:         Physical Exam  Procedures  Procedures  ED Course / MDM   Clinical Course as of 05/29/24 1852  Mon May 29, 2024  1752 Spoke with CT, patient is in line for imaging  [CH]    Clinical Course User Index [CH] Hinnant, Terrall FALCON, PA-C   Medical Decision Making Amount and/or Complexity of Data Reviewed Labs: ordered. Radiology: ordered.  Risk OTC drugs. Prescription drug management. Decision regarding hospitalization.   Patient received at shift change from prior EDP [facility], see their note for initial history, physical exam findings, lab/imaging interpretations, and initial assessment/plan.   Patient presenting with increased shortness of breath, h/o COPD on 5L [place] at baseline, had to up-titrate her home O2 to 7L due to SOB. Presenting with fever, positive for Flu A. CXR questionable for PNA, CTA pending at time of shift change. Patient would likely benefit from admission given age/comorbidities.  CTA: 1. No pulmonary embolus. 2. Moderate emphysema. Moderate bronchial thickening. Subsegmental bandlike atelectasis but no evidence of pneumonia. 3. Stable small left upper lobe pulmonary nodules, largest measuring 4 mm. These nodules can be considered benign and no specific imaging follow-up.  I spoke with the hospitalist, Dr. Franky, who agrees that this patient is appropriate for admission and requests initiation of Tamiflu . Patient/son are aware that she is being admitted.        Kathleen [name] SAILOR, [place] 05/29/24 2111  "

## 2024-05-29 NOTE — ED Provider Notes (Signed)
 " [place] EMERGENCY DEPARTMENT AT [facility] Provider Note   CSN: [id number] Arrival date & time: 05/29/24  1110     Patient presents with: Shortness of Breath   Kathleen [name] is a 78 y.o. female who presents to the ED with a chief complaint shortness of breath. Patient is chronically on 4.5L of oxygen  at home however had to raise her oxygen  requirement due to new onset shortness of breath. Patient is on Eliquis  for atrial fibrillation and has not missed any doses of her medication. Patient states she has not taken her temperature at home but did feel febrile yesterday so she took some tylenol . Patient also reports cough for approximately 3 days. Denies chest pain, abdominal pain, nausea, vomiting.  Has medical history significant for chronic respiratory failure with hypoxia, chronic combined systolic and diastolic heart failure, hypertension, COPD, persistent atrial fibrillation, type 2 diabetes, chronic anticoagulation on Eliquis , pacemaker placement, stage IIIa chronic kidney disease, hyperlipidemia, asthma, etc. Denies known sick contacts.     Shortness of Breath      Prior to Admission medications  Medication Sig Start Date End Date Taking? Authorizing Provider  oxyCODONE -acetaminophen  (PERCOCET) 10-325 MG tablet Take 0.5-1 tablets by mouth See admin instructions. *1-3 times daily* 05/16/24  Yes [provider]  azelastine  (ASTELIN ) 0.1 % nasal spray Place 2 sprays into both nostrils 2 (two) times daily. 03/30/24   Jude Harden GAILS, MD  azithromycin  (ZITHROMAX ) 250 MG tablet Take 1 tablet (250 mg total) by mouth daily. 03/30/24   Jude Harden GAILS, MD  BREO ELLIPTA  100-25 MCG/ACT AEPB inhale ONE PUFF into THE lungs daily. 04/18/24   Jude Harden GAILS, MD  Cholecalciferol  (VITAMIN D3) 50 MCG (2000 UT) TABS Take 2,000 Units by mouth at bedtime.    [provider]  cyanocobalamin  (VITAMIN B12) 1000 MCG tablet Take 1,000 mcg by mouth daily.    [provider]  docusate sodium  (COLACE) 100 MG capsule Take 100 mg by mouth daily.    [provider]  dofetilide  (TIKOSYN ) 250 MCG capsule TAKE ONE CAPSULE BY MOUTH EVERY MORNING AND AT BEDTIME. 04/14/24   Rolan Ezra RAMAN, MD  ELIQUIS  5 MG TABS tablet Take 1 tablet (5 mg total) by mouth 2 (two) times daily. 09/23/23   Bensimhon, Toribio SAUNDERS, MD  febuxostat  (ULORIC ) 40 MG tablet Take 1 tablet (40 mg total) by mouth daily. 01/25/24   Jeannetta Lonni ORN, MD  ferrous gluconate  [facility]) 324 MG tablet Take 1 tablet (324 mg total) by mouth daily with breakfast. 03/28/24   Mansouraty, Aloha Raddle., MD  gabapentin  (NEURONTIN ) 300 MG capsule Take 1 capsule (300 mg total) by mouth at bedtime. 09/23/23 09/17/24  Gregg Lek, MD  ketoconazole  (NIZORAL ) 2 % cream Apply 1 Application topically 2 (two) times daily. Patient taking differently: Apply 1 Application topically as needed. 10/21/23   Joshua Debby CROME, MD  Magnesium  500 MG TABS Take 500 mg by mouth every evening.    [provider]  mupirocin  ointment (BACTROBAN ) 2 % Apply 1 Application topically 2 (two) times daily. 10/22/23   Joshua Debby CROME, MD  naloxone  (NARCAN ) nasal spray 4 mg/0.1 mL Place 1 spray into the nose once as needed (opioid overdose). 11/12/23   [provider]  oxyCODONE -acetaminophen  (PERCOCET/ROXICET) 5-325 MG tablet Take 1 tablet by mouth every 8 (eight) hours as needed for moderate pain (pain score 4-6). 11/16/23   [provider]  OXYGEN  Inhale 4 L into the lungs continuous. Use with resmed ventilator  [provider]  potassium chloride  (KLOR-CON ) 10 MEQ tablet Take 4 tablets (40 mEq total) by mouth daily. 01/05/24 04/04/24  Glena Harlene HERO, FNP  revefenacin  (YUPELRI ) 175 MCG/3ML nebulizer solution Take 175 mcg by nebulization daily.    [provider]  spironolactone  (ALDACTONE ) 25 MG tablet Take 1 tablet (25 mg total) by mouth every evening. Patient taking differently: Take 12.5 mg by mouth  every evening. 01/10/24   Lee, Jordan, NP  torsemide  (DEMADEX ) 20 MG tablet Take 2 tablets (40 mg total) by mouth daily. Patient taking differently: Take 20 mg by mouth daily. 01/10/24   Lee, Jordan, NP  triamcinolone  cream (KENALOG ) 0.1 % Apply 1 Application topically 2 (two) times daily. 07/02/23   Cyndi Shaver, PA-C    Allergies: Allopurinol, Clindamycin, Flublok [influenza vaccine recombinant], Pneumococcal 13-val conj vacc, Dronedarone, Montelukast , Brovana  [arformoterol ], Budesonide , Entresto  [sacubitril -valsartan ], Fosamax [alendronate sodium], Jardiance  [empagliflozin ], Meperidine, Microplegia msa-msg [plegisol], Rosuvastatin, Tetracycline, Adhesive [tape], and Lovastatin    Review of Systems  Respiratory:  Positive for shortness of breath.     Updated Vital Signs BP (!) 126/51 (BP Location: Right Arm)   Pulse 72   Temp 98.8 F (37.1 C) (Oral)   Resp 18   Ht 5' (1.524 m)   Wt 84 kg   SpO2 99%   BMI 36.17 kg/m   Physical Exam Vitals and nursing note reviewed.  Constitutional:      General: She is not in acute distress.    Appearance: She is well-developed. She is ill-appearing (Chronically ill-appearing). She is not toxic-appearing or diaphoretic.     Comments: Patient on 7L of oxygen  at time of my initial exam, I decreased this to 5L which the patient tolerated well throughout stay  HENT:     Head: Normocephalic and atraumatic.  Eyes:     General: No scleral icterus. Neck:     Vascular: No JVD.     Comments: No JVD Cardiovascular:     Rate and Rhythm: Normal rate and regular rhythm.  Pulmonary:     Effort: Pulmonary effort is normal. No respiratory distress.     Breath sounds: No stridor. Rhonchi present.     Comments: Patient talking in full sentences on room air Musculoskeletal:        General: Normal range of motion.     Right lower leg: No tenderness. No edema.     Left lower leg: No tenderness. No edema.     Comments: Grossly normal ROM of all 4 extremities   Skin:    General: Skin is warm.     Capillary Refill: Capillary refill takes less than 2 seconds.     Comments: No obvious rashes  Neurological:     General: No focal deficit present.     Mental Status: She is alert and oriented to person, place, and time.  Psychiatric:        Mood and Affect: Mood normal.        Behavior: Behavior normal.     (all labs ordered are listed, but only abnormal results are displayed) Labs Reviewed  RESP PANEL BY RT-PCR (RSV, FLU A&B, COVID)  RVPGX2 - Abnormal; Notable for the following components:      Result Value   Influenza A by PCR POSITIVE (*)    All other components within normal limits  BASIC METABOLIC PANEL WITH GFR - Abnormal; Notable for the following components:   Glucose, Bld 120 (*)    All other components within normal limits  CBC  EKG: EKG Interpretation Date/Time:  Monday May 29 2024 15:53:52 EST Ventricular Rate:  73 PR Interval:  187 QRS Duration:  133 QT Interval:  392 QTC Calculation: 432 R Axis:   -63  Text Interpretation: paced, no sig change from previous Confirmed by Armenta Canning [phone]) on 05/29/2024 4:54:57 PM  Radiology: CT Angio Chest PE W and/or Wo Contrast Result Date: 05/29/2024 CLINICAL DATA:  Cough, shortness of breath and fever. EXAM: CT ANGIOGRAPHY CHEST WITH CONTRAST TECHNIQUE: Multidetector CT imaging of the chest was performed using the standard protocol during bolus administration of intravenous contrast. Multiplanar CT image reconstructions and MIPs were obtained to evaluate the vascular anatomy. RADIATION DOSE REDUCTION: This exam was performed according to the departmental dose-optimization program which includes automated exposure control, adjustment of the mA and/or kV according to patient size and/or use of iterative reconstruction technique. CONTRAST:  80mL OMNIPAQUE  IOHEXOL  350 MG/ML SOLN COMPARISON:  Radiograph earlier today.  Chest CT 03/30/2023 FINDINGS: Cardiovascular: There are no  filling defects within the pulmonary arteries to suggest pulmonary embolus. CardioMEMS in the left lower lobe pulmonary artery. Atherosclerosis of the thoracic aorta. No aortic aneurysm. Limited aortic assessment, but no findings to suggest acute aortic injury. 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 The heart is mildly enlarged. There are coronary artery calcifications. No pericardial effusion. Mediastinum/Nodes: Shotty mediastinal lymph nodes not enlarged by size criteria. No hilar adenopathy. Patulous upper esophagus. Lungs/Pleura: Moderate emphysema. Diffuse bronchial thickening, greatest in the bilateral lower lobes and right upper lobe. Bandlike subsegmental scarring in the lower lobes. No confluent opacity. 4 mm left upper lobe pulmonary nodule, series 6, image 41, unchanged from prior exam. Punctate left upper lobe nodule series 6, image 56, also unchanged. No new pulmonary nodules. No pleural effusion. Upper Abdomen: Small cyst in the central liver. Broad-based upper abdominal ventral abdominal wall hernia containing portions of the stomach, partially included in the field of view. Cholecystectomy. No acute upper abdominal findings. Musculoskeletal: Degenerative change throughout the spine. Chronic shoulder arthropathy. There are no acute or suspicious osseous abnormalities. Review of the MIP images confirms the above findings. IMPRESSION: 1. No pulmonary embolus. 2. Moderate emphysema. Moderate bronchial thickening. Subsegmental bandlike atelectasis but no evidence of pneumonia. 3. Stable small left upper lobe pulmonary nodules, largest measuring 4 mm. These nodules can be considered benign and no specific imaging follow-up. Aortic Atherosclerosis (ICD10-I70.0) and Emphysema (ICD10-J43.9). Electronically Signed   By: Andrea Gasman M.D.   On: 05/29/2024 19:44   DG Chest 2 View Result Date: 05/29/2024 CLINICAL DATA:  Cough. EXAM: CHEST - 2 VIEW COMPARISON:  Chest CT dated  01/19/2024 FINDINGS: Background of emphysema. There are bibasilar atelectasis/scarring. Pneumonia is not excluded. There is mild cardiomegaly. Left pectoral pacemaker device. No acute osseous pathology. IMPRESSION: Bibasilar atelectasis/scarring. Pneumonia is not excluded. Electronically Signed   By: Vanetta Chou M.D.   On: 05/29/2024 13:48     Procedures   Medications Ordered in the ED  oseltamivir  (TAMIFLU ) capsule 75 mg (75 mg Oral Given 05/29/24 2111)  acetaminophen  (TYLENOL ) tablet 650 mg (650 mg Oral Given 05/29/24 1548)  ipratropium-albuterol  (DUONEB) 0.5-2.5 (3) MG/3ML nebulizer solution 3 mL (3 mLs Nebulization Given 05/29/24 1540)  acetaminophen  (TYLENOL ) tablet 650 mg (650 mg Oral Given 05/29/24 1903)  iohexol  (OMNIPAQUE ) 350 MG/ML injection 100 mL (80 mLs Intravenous Contrast Given 05/29/24 1915)    Clinical Course as of 05/29/24 2314  Mon May 29, 2024  1752 Spoke with CT, patient is in line for imaging  [  CH]    Clinical Course User Index [CH] Jenisa Monty, [place] FALCON, PA-C                                 Medical Decision Making Amount and/or Complexity of Data Reviewed Labs: ordered. Radiology: ordered.  Risk OTC drugs. Prescription drug management. Decision regarding hospitalization.   Patient presents to the ED for concern of shortness of breath, this involves an extensive number of treatment options, and is a complaint that carries with it a high risk of complications and morbidity.  The differential diagnosis includes viral infection, pneumonia, PE, heart failure exacerbation, pneumothorax, etc.   Co morbidities that complicate the patient evaluation  chronic respiratory failure with hypoxia, chronic combined systolic and diastolic heart failure, hypertension, COPD, persistent atrial fibrillation, type 2 diabetes, chronic anticoagulation on Eliquis , pacemaker placement, stage IIIa chronic kidney disease, hyperlipidemia, asthma   Lab Tests:  I Ordered, and  personally interpreted labs.  The pertinent results include: Positive for influenza A, BMP unremarkable, CBC unremarkable, no elevated white blood cell count   Imaging Studies ordered:  I ordered imaging studies including chest x-ray I independently visualized and interpreted imaging which showed bibasilar atelectasis/scarring, pneumonia is not excluded I agree with the radiologist interpretation   Cardiac Monitoring:  The patient was maintained on a cardiac monitor.  I personally viewed and interpreted the cardiac monitored which showed an underlying rhythm of: Sinus rhythm, paced   Medicines ordered and prescription drug management:  I ordered medication including tylenol   for pain and fever, breathing treatments for shortness of breath  Reevaluation of the patient after these medicines showed that the patient stayed the same I have reviewed the patients home medicines and have made adjustments as needed   Test Considered:  BNP: considered however HF exacerbation less likely as patient is febrile with URI like symptoms, no evidence of fluid on CXR   Critical Interventions:  none   Problem List / ED Course:  78 year old female, vital signs significant for fever, presents to the emergency department with a chief complaint of shortness of breath as well as URI symptoms Extensive comorbidities including COPD, heart failure, CKD, etc., patient is typically on 4-1/2 L of oxygen  at home and has had to increase this Rhonchi present on physical exam, patient talking in full sentences on room air, I decreased the 7 L of oxygen  and that the patient was on to 5 L which the patient tolerated well throughout her stay Respiratory panel significant for flu A, chest x-ray read as atelectasis/scarring versus pneumonia Will obtain CT imaging to assess possibility of pneumonia as patient is febrile with productive cough however patient does not have a white blood cell count and labs are otherwise  reassuring Patient pending CT at time of signout to oncoming provider Rocky Hamilton, PA-C, at this time plan is for CT imaging of chest and most likely admission to the hospital for ongoing diagnosis and treatment due to shortness of breath and need for increased oxygen  requirement at home.  Oncoming provider assumes care over this patient, further work-up, and ultimately disposition.  Doubt PE as patient has been compliant with Eliquis  therapy, also no evidence of heart failure exacerbation on work-up, most likely diagnosis for shortness of breath at this time is acute flu infection  Vitals have been stable throughout ED stay   Reevaluation:  After the interventions noted above, I reevaluated the patient and found that they  have :stayed the same   Social Determinants of Health:  Patient lives in independent living facility   Dispostion:  Patient pending CT at time of signout to oncoming provider Rocky Hamilton, PA-C, at this time plan is for CT imaging of chest and most likely admission to the hospital for ongoing diagnosis and treatment. Oncoming provider assumes care over this patient, further work-up, and ultimately disposition.      Final diagnoses:  Shortness of breath  Influenza A    ED Discharge Orders     None          Janetta Terrall FALCON, [place] 05/29/24 2314  "

## 2024-05-30 DIAGNOSIS — Z79899 Other long term (current) drug therapy: Secondary | ICD-10-CM | POA: Diagnosis not present

## 2024-05-30 DIAGNOSIS — Z881 Allergy status to other antibiotic agents status: Secondary | ICD-10-CM | POA: Diagnosis not present

## 2024-05-30 DIAGNOSIS — R0602 Shortness of breath: Secondary | ICD-10-CM | POA: Diagnosis present

## 2024-05-30 DIAGNOSIS — J9611 Chronic respiratory failure with hypoxia: Secondary | ICD-10-CM | POA: Diagnosis present

## 2024-05-30 DIAGNOSIS — Z87891 Personal history of nicotine dependence: Secondary | ICD-10-CM | POA: Diagnosis not present

## 2024-05-30 DIAGNOSIS — Z90710 Acquired absence of both cervix and uterus: Secondary | ICD-10-CM | POA: Diagnosis not present

## 2024-05-30 DIAGNOSIS — J101 Influenza due to other identified influenza virus with other respiratory manifestations: Secondary | ICD-10-CM

## 2024-05-30 DIAGNOSIS — G8929 Other chronic pain: Secondary | ICD-10-CM | POA: Diagnosis present

## 2024-05-30 DIAGNOSIS — Z9981 Dependence on supplemental oxygen: Secondary | ICD-10-CM | POA: Diagnosis not present

## 2024-05-30 DIAGNOSIS — J441 Chronic obstructive pulmonary disease with (acute) exacerbation: Secondary | ICD-10-CM | POA: Diagnosis present

## 2024-05-30 DIAGNOSIS — Z800 Family history of malignant neoplasm of digestive organs: Secondary | ICD-10-CM | POA: Diagnosis not present

## 2024-05-30 DIAGNOSIS — I5032 Chronic diastolic (congestive) heart failure: Secondary | ICD-10-CM | POA: Diagnosis present

## 2024-05-30 DIAGNOSIS — Z803 Family history of malignant neoplasm of breast: Secondary | ICD-10-CM | POA: Diagnosis not present

## 2024-05-30 DIAGNOSIS — N1831 Chronic kidney disease, stage 3a: Secondary | ICD-10-CM | POA: Diagnosis present

## 2024-05-30 DIAGNOSIS — J439 Emphysema, unspecified: Secondary | ICD-10-CM | POA: Diagnosis present

## 2024-05-30 DIAGNOSIS — Z825 Family history of asthma and other chronic lower respiratory diseases: Secondary | ICD-10-CM | POA: Diagnosis not present

## 2024-05-30 DIAGNOSIS — I428 Other cardiomyopathies: Secondary | ICD-10-CM | POA: Diagnosis present

## 2024-05-30 DIAGNOSIS — Z1152 Encounter for screening for COVID-19: Secondary | ICD-10-CM | POA: Diagnosis not present

## 2024-05-30 DIAGNOSIS — Z933 Colostomy status: Secondary | ICD-10-CM | POA: Diagnosis not present

## 2024-05-30 DIAGNOSIS — I130 Hypertensive heart and chronic kidney disease with heart failure and stage 1 through stage 4 chronic kidney disease, or unspecified chronic kidney disease: Secondary | ICD-10-CM | POA: Diagnosis present

## 2024-05-30 DIAGNOSIS — Z6836 Body mass index (BMI) 36.0-36.9, adult: Secondary | ICD-10-CM | POA: Diagnosis not present

## 2024-05-30 DIAGNOSIS — Z7901 Long term (current) use of anticoagulants: Secondary | ICD-10-CM | POA: Diagnosis not present

## 2024-05-30 DIAGNOSIS — E66812 Obesity, class 2: Secondary | ICD-10-CM | POA: Diagnosis present

## 2024-05-30 DIAGNOSIS — I4811 Longstanding persistent atrial fibrillation: Secondary | ICD-10-CM | POA: Diagnosis present

## 2024-05-30 DIAGNOSIS — Z950 Presence of cardiac pacemaker: Secondary | ICD-10-CM | POA: Diagnosis not present

## 2024-05-30 LAB — CBC
HCT: 37.2 % (ref 36.0–46.0)
Hemoglobin: 11.9 g/dL — ABNORMAL LOW (ref 12.0–15.0)
MCH: 27.5 pg (ref 26.0–34.0)
MCHC: 32 g/dL (ref 30.0–36.0)
MCV: 85.9 fL (ref 80.0–100.0)
Platelets: 150 K/uL (ref 150–400)
RBC: 4.33 MIL/uL (ref 3.87–5.11)
RDW: 14.3 % (ref 11.5–15.5)
WBC: 4.9 K/uL (ref 4.0–10.5)
nRBC: 0 % (ref 0.0–0.2)

## 2024-05-30 LAB — MAGNESIUM: Magnesium: 2.1 mg/dL (ref 1.7–2.4)

## 2024-05-30 LAB — BASIC METABOLIC PANEL WITH GFR
Anion gap: 10 (ref 5–15)
BUN: 9 mg/dL (ref 8–23)
CO2: 25 mmol/L (ref 22–32)
Calcium: 9.2 mg/dL (ref 8.9–10.3)
Chloride: 104 mmol/L (ref 98–111)
Creatinine, Ser: 0.56 mg/dL (ref 0.44–1.00)
GFR, Estimated: >60 mL/min
Glucose, Bld: 91 mg/dL (ref 70–99)
Potassium: 4.2 mmol/L (ref 3.5–5.1)
Sodium: 139 mmol/L (ref 135–145)

## 2024-05-30 MED ORDER — ONDANSETRON HCL 4 MG PO TABS: 4.0000 mg | ORAL_TABLET | Freq: Four times a day (QID) | ORAL | Status: DC | PRN

## 2024-05-30 MED ORDER — TRAZODONE HCL 50 MG PO TABS
25.0000 mg | ORAL_TABLET | Freq: Every evening | ORAL | Status: DC | PRN
  Administered 2024-06-01: 25 mg via ORAL
  Filled 2024-05-30: qty 1

## 2024-05-30 MED ORDER — IPRATROPIUM-ALBUTEROL 0.5-2.5 (3) MG/3ML IN SOLN
3.0000 mL | Freq: Four times a day (QID) | RESPIRATORY_TRACT | Status: DC
  Administered 2024-05-30: 3 mL via RESPIRATORY_TRACT
  Filled 2024-05-30: qty 3

## 2024-05-30 MED ORDER — FLUTICASONE FUROATE-VILANTEROL 100-25 MCG/ACT IN AEPB
1.0000 | INHALATION_SPRAY | Freq: Every day | RESPIRATORY_TRACT | Status: DC
  Administered 2024-05-31 – 2024-06-02 (×3): 1 via RESPIRATORY_TRACT
  Filled 2024-05-30: qty 28

## 2024-05-30 MED ORDER — DOFETILIDE 250 MCG PO CAPS
250.0000 ug | ORAL_CAPSULE | Freq: Two times a day (BID) | ORAL | Status: DC
  Administered 2024-05-30 – 2024-06-02 (×7): 250 ug via ORAL
  Filled 2024-05-30 (×8): qty 1

## 2024-05-30 MED ORDER — APIXABAN 5 MG PO TABS
5.0000 mg | ORAL_TABLET | Freq: Two times a day (BID) | ORAL | Status: DC
  Administered 2024-05-30 – 2024-06-02 (×7): 5 mg via ORAL
  Filled 2024-05-30 (×7): qty 1

## 2024-05-30 MED ORDER — ONDANSETRON HCL 4 MG/2ML IJ SOLN
INTRAMUSCULAR | Status: AC
  Filled 2024-05-30: qty 2

## 2024-05-30 MED ORDER — FERROUS GLUCONATE 324 (38 FE) MG PO TABS
324.0000 mg | ORAL_TABLET | Freq: Every day | ORAL | Status: DC
  Administered 2024-05-31 – 2024-06-02 (×3): 324 mg via ORAL
  Filled 2024-05-30 (×3): qty 1

## 2024-05-30 MED ORDER — GUAIFENESIN-DM 100-10 MG/5ML PO SYRP
10.0000 mL | ORAL_SOLUTION | Freq: Four times a day (QID) | ORAL | Status: DC | PRN
  Administered 2024-05-30 – 2024-06-02 (×6): 10 mL via ORAL
  Filled 2024-05-30 (×6): qty 10

## 2024-05-30 MED ORDER — GABAPENTIN 300 MG PO CAPS
300.0000 mg | ORAL_CAPSULE | Freq: Every day | ORAL | Status: DC
  Administered 2024-05-30 – 2024-06-01 (×3): 300 mg via ORAL
  Filled 2024-05-30 (×3): qty 1

## 2024-05-30 MED ORDER — FEBUXOSTAT 40 MG PO TABS
40.0000 mg | ORAL_TABLET | Freq: Every day | ORAL | Status: DC
  Administered 2024-05-30 – 2024-06-02 (×4): 40 mg via ORAL
  Filled 2024-05-30 (×4): qty 1

## 2024-05-30 MED ORDER — IPRATROPIUM-ALBUTEROL 0.5-2.5 (3) MG/3ML IN SOLN: 3.0000 mL | Freq: Four times a day (QID) | RESPIRATORY_TRACT | Status: DC | PRN

## 2024-05-30 MED ORDER — DOCUSATE SODIUM 100 MG PO CAPS
100.0000 mg | ORAL_CAPSULE | Freq: Two times a day (BID) | ORAL | Status: DC
  Administered 2024-05-31 – 2024-06-02 (×5): 100 mg via ORAL
  Filled 2024-05-30 (×5): qty 1

## 2024-05-30 MED ORDER — HYDROMORPHONE HCL 1 MG/ML IJ SOLN
0.5000 mg | INTRAMUSCULAR | Status: DC | PRN
  Administered 2024-05-30 – 2024-06-01 (×10): 0.5 mg via INTRAVENOUS
  Filled 2024-05-30 (×10): qty 0.5

## 2024-05-30 MED ORDER — REVEFENACIN 175 MCG/3ML IN SOLN
175.0000 ug | Freq: Every day | RESPIRATORY_TRACT | Status: DC
  Administered 2024-05-30 – 2024-06-02 (×4): 175 ug via RESPIRATORY_TRACT
  Filled 2024-05-30 (×4): qty 3

## 2024-05-30 MED ORDER — OSELTAMIVIR PHOSPHATE 30 MG PO CAPS
30.0000 mg | ORAL_CAPSULE | Freq: Two times a day (BID) | ORAL | Status: DC
  Administered 2024-05-30 – 2024-06-02 (×7): 30 mg via ORAL
  Filled 2024-05-30 (×7): qty 1

## 2024-05-30 MED ORDER — METOCLOPRAMIDE HCL 5 MG/ML IJ SOLN: 10.0000 mg | Freq: Four times a day (QID) | INTRAMUSCULAR | Status: DC | PRN

## 2024-05-30 MED ORDER — DOFETILIDE 250 MCG PO CAPS: 250.0000 ug | ORAL_CAPSULE | Freq: Two times a day (BID) | ORAL | Status: DC

## 2024-05-30 MED ORDER — ONDANSETRON HCL 4 MG/2ML IJ SOLN
4.0000 mg | Freq: Four times a day (QID) | INTRAMUSCULAR | Status: DC | PRN
  Administered 2024-05-30: 4 mg via INTRAVENOUS

## 2024-05-30 MED ORDER — OXYCODONE-ACETAMINOPHEN 5-325 MG PO TABS
1.0000 | ORAL_TABLET | Freq: Three times a day (TID) | ORAL | Status: DC | PRN
  Administered 2024-05-30 – 2024-06-01 (×5): 1 via ORAL
  Filled 2024-05-30 (×5): qty 1

## 2024-05-30 MED ORDER — ACETAMINOPHEN 325 MG PO TABS
650.0000 mg | ORAL_TABLET | Freq: Four times a day (QID) | ORAL | Status: DC | PRN
  Administered 2024-06-02: 650 mg via ORAL
  Filled 2024-05-30: qty 2

## 2024-05-30 MED ORDER — SPIRONOLACTONE 12.5 MG HALF TABLET
12.5000 mg | ORAL_TABLET | Freq: Every evening | ORAL | Status: DC
  Administered 2024-05-30: 12.5 mg via ORAL
  Filled 2024-05-30 (×2): qty 1

## 2024-05-30 NOTE — ED Notes (Signed)
 [facility] arrived to transport pt. All belongings including cell phone with pt at time of transfer.

## 2024-05-30 NOTE — Plan of Care (Signed)
" °  Problem: Clinical Measurements: Goal: Respiratory complications will improve Outcome: Progressing   Problem: Clinical Measurements: Goal: Cardiovascular complication will be avoided Outcome: Progressing   Problem: Elimination: Goal: Will not experience complications related to bowel motility Outcome: Progressing   Problem: Elimination: Goal: Will not experience complications related to urinary retention Outcome: Progressing   Problem: Pain Managment: Goal: General experience of comfort will improve and/or be controlled Outcome: Progressing   "

## 2024-05-30 NOTE — H&P (Signed)
 " History and Physical  [name] FMW:[id number] DOB: 1946-03-29 DOA: 05/29/2024  PCP: Joshua Debby CROME, MD   Chief Complaint: Cough, body aches, fever  HPI: Kathleen [name] is a 78 y.o. 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  Patient states she started feeling unwell about 3 days ago now, with bodyaches, cough productive of yellow sputum, congestion and subjective fever.  She did not measure the fever.  Denies any nausea, vomiting, chest pain, diarrhea or any known sick contacts.  Review of Systems: Please see HPI for pertinent positives and negatives. A complete 10 system review of systems are otherwise negative.  Past Medical History:  Diagnosis Date   Arthritis    Asthma    Blood transfusion without reported diagnosis 2015   in hospital   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HCC)    Cataract 2016   very small   CHF (congestive heart failure) (HCC)    Chronic renal insufficiency    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    Concussion 10/04/2021   COPD (chronic obstructive pulmonary disease) (HCC)    Diverticulitis    Emphysema of lung (HCC) 2013   lung collapsed   Gout    Hypertension    Longstanding persistent atrial fibrillation (HCC)    on Xarelto    Nonischemic cardiomyopathy (HCC)    Obesity    Oxygen  deficiency 2013   Pacemaker    Spontaneous pneumothorax 2013   Thyroid  disease 1990   Past Surgical History:  Procedure Laterality Date   ABDOMINAL HYSTERECTOMY     APPENDECTOMY     APPLICATION OF WOUND VAC  04/27/2023   Procedure: APPLICATION OF WOUND VAC;  Surgeon: Ebbie Cough, MD;  Location: [facility] OR;  Service: General;;   ATRIAL FIBRILLATION ABLATION  07/20/2013   by Dr  Pierrette   AV nodal ablation  11/01/2013   by Dr Pierrette, repeated by Dr Rosine   BREAST BIOPSY Bilateral 1997   negative   CARDIAC CATHETERIZATION     CESAREAN SECTION  1976   CHOLECYSTECTOMY     COLON RESECTION SIGMOID N/A 04/27/2023   Procedure: COLON RESECTION SIGMOID WITH COLOSTOMY;  Surgeon: Ebbie Cough, MD;  Location: [facility] OR;  Service: General;  Laterality: N/A;   COLON SURGERY  2024   ESOPHAGOGASTRODUODENOSCOPY (EGD) WITH PROPOFOL  N/A 05/24/2023   Procedure: ESOPHAGOGASTRODUODENOSCOPY (EGD) WITH PROPOFOL ;  Surgeon: Albertus Gordy HERO, MD;  Location: [facility] ENDOSCOPY;  Service: Gastroenterology;  Laterality: N/A;   HERNIA REPAIR     LAPAROTOMY N/A 04/27/2023   Procedure: EXPLORATORY LAPAROTOMY;  Surgeon: Ebbie Cough, MD;  Location: [facility] OR;  Service: General;  Laterality: N/A;   PACEMAKER INSERTION  06/2017   MDT Viva CRT-P implanted by Dr Pierrette after AV nodal ablation,  LV lead could not be placed   PACEMAKER INSERTION  05/2020   with lead bundle   RIGHT/LEFT HEART CATH AND CORONARY ANGIOGRAPHY N/A 03/20/2022   Procedure: RIGHT/LEFT HEART CATH AND CORONARY ANGIOGRAPHY;  Surgeon: Jordan, Peter M, MD;  Location: [facility] INVASIVE CV LAB;  Service: Cardiovascular;  Laterality: N/A;   TUBAL LIGATION  1976   Social History:  reports that she quit smoking about 37 years ago. Her smoking use included cigarettes. She started smoking about 57 years ago. She has  a 20 pack-year smoking history. She has been exposed to tobacco smoke. She has never used smokeless tobacco. She reports that she does not drink alcohol and does not use drugs.  Allergies[1]  Family History  Problem Relation Age of Onset   Breast cancer Mother 11       3 different times   Pancreatic cancer Mother    Emphysema Father    Healthy Brother    Breast cancer Cousin    Valvular heart disease Son    Obesity Daughter    Colon cancer Neg Hx    Esophageal cancer Neg Hx    Stomach cancer Neg Hx    Inflammatory bowel  disease Neg Hx    Liver disease Neg Hx    Rectal cancer Neg Hx      Prior to Admission medications  Medication Sig Start Date End Date Taking? Authorizing Provider  oxyCODONE -acetaminophen  (PERCOCET) 10-325 MG tablet Take 0.5-1 tablets by mouth See admin instructions. *1-3 times daily* 05/16/24  Yes [provider]  azelastine  (ASTELIN ) 0.1 % nasal spray Place 2 sprays into both nostrils 2 (two) times daily. 03/30/24   Jude Harden GAILS, MD  azithromycin  (ZITHROMAX ) 250 MG tablet Take 1 tablet (250 mg total) by mouth daily. 03/30/24   Jude Harden GAILS, MD  BREO ELLIPTA  100-25 MCG/ACT AEPB inhale ONE PUFF into THE lungs daily. 04/18/24   Jude Harden GAILS, MD  Cholecalciferol  (VITAMIN D3) 50 MCG (2000 UT) TABS Take 2,000 Units by mouth at bedtime.    [provider]  cyanocobalamin  (VITAMIN B12) 1000 MCG tablet Take 1,000 mcg by mouth daily.    [provider]  docusate sodium  (COLACE) 100 MG capsule Take 100 mg by mouth daily.    [provider]  dofetilide  (TIKOSYN ) 250 MCG capsule TAKE ONE CAPSULE BY MOUTH EVERY MORNING AND AT BEDTIME. 04/14/24   Rolan Ezra RAMAN, MD  ELIQUIS  5 MG TABS tablet Take 1 tablet (5 mg total) by mouth 2 (two) times daily. 09/23/23   Bensimhon, Toribio SAUNDERS, MD  febuxostat  (ULORIC ) 40 MG tablet Take 1 tablet (40 mg total) by mouth daily. 01/25/24   Jeannetta Lonni ORN, MD  ferrous gluconate  [facility]) 324 MG tablet Take 1 tablet (324 mg total) by mouth daily with breakfast. 03/28/24   Mansouraty, Aloha Raddle., MD  gabapentin  (NEURONTIN ) 300 MG capsule Take 1 capsule (300 mg total) by mouth at bedtime. 09/23/23 09/17/24  Gregg Lek, MD  ketoconazole  (NIZORAL ) 2 % cream Apply 1 Application topically 2 (two) times daily. Patient taking differently: Apply 1 Application topically as needed. 10/21/23   Joshua Debby CROME, MD  Magnesium  500 MG TABS Take 500 mg by mouth every evening.    [provider]  mupirocin  ointment (BACTROBAN ) 2 % Apply 1  Application topically 2 (two) times daily. 10/22/23   Joshua Debby CROME, MD  naloxone  (NARCAN ) nasal spray 4 mg/0.1 mL Place 1 spray into the nose once as needed (opioid overdose). 11/12/23   [provider]  oxyCODONE -acetaminophen  (PERCOCET/ROXICET) 5-325 MG tablet Take 1 tablet by mouth every 8 (eight) hours as needed for moderate pain (pain score 4-6). 11/16/23   [provider]  OXYGEN  Inhale 4 L into the lungs continuous. Use with resmed ventilator    [provider]  potassium chloride  (KLOR-CON ) 10 MEQ tablet Take 4 tablets (40 mEq total) by mouth daily. 01/05/24 04/04/24  Glena Harlene HERO, FNP  revefenacin  (YUPELRI ) 175 MCG/3ML nebulizer solution Take 175 mcg by nebulization daily.  [provider]  spironolactone  (ALDACTONE ) 25 MG tablet Take 1 tablet (25 mg total) by mouth every evening. Patient taking differently: Take 12.5 mg by mouth every evening. 01/10/24   Lee, Jordan, NP  torsemide  (DEMADEX ) 20 MG tablet Take 2 tablets (40 mg total) by mouth daily. Patient taking differently: Take 20 mg by mouth daily. 01/10/24   Lee, Jordan, NP  triamcinolone  cream (KENALOG ) 0.1 % Apply 1 Application topically 2 (two) times daily. 07/02/23   Cyndi Shaver, PA-C    Physical Exam: BP 136/64 (BP Location: Right Arm)   Pulse 71   Temp 99 F (37.2 C) (Oral)   Resp 20   Ht 5' (1.524 m)   Wt 87.3 kg   SpO2 100%   BMI 37.59 kg/m  General:  Alert, oriented, calm, in no acute distress, looks tired but not chronically ill.  Currently wearing her home baseline 4 L nasal cannula oxygen .  Speaking a few words at a time without coughing. Cardiovascular: RRR, no murmurs or rubs, no peripheral edema  Respiratory: Good equal bilateral air movement, without respiratory distress such as tachypnea or stridor.  She does have some diffuse rhonchi, without wheezing. Abdomen: soft, nontender, nondistended, normal bowel tones heard  Skin: dry, no rashes  Musculoskeletal: no joint  effusions, normal range of motion  Psychiatric: appropriate affect, normal speech  Neurologic: extraocular muscles intact, clear speech, moving all extremities with intact sensorium         Labs on Admission:  Basic Metabolic Panel: Recent Labs  Lab 05/29/24 1129  NA 140  K 4.5  CL 104  CO2 26  GLUCOSE 120*  BUN 12  CREATININE 0.71  CALCIUM  10.0   Liver Function Tests: No results for input(s): AST, ALT, ALKPHOS, BILITOT, PROT, ALBUMIN  in the last 168 hours. No results for input(s): LIPASE, AMYLASE in the last 168 hours. No results for input(s): AMMONIA in the last 168 hours. CBC: Recent Labs  Lab 05/29/24 1129  WBC 7.0  HGB 12.6  HCT 39.5  MCV 85.9  PLT 161   Cardiac Enzymes: No results for input(s): CKTOTAL, CKMB, CKMBINDEX, TROPONINI in the last 168 hours. BNP (last 3 results) Recent Labs    01/11/24 1735 01/19/24 1337 02/01/24 1619  BNP 96.9 43.2 65.5    ProBNP (last 3 results) No results for input(s): PROBNP in the last 8760 hours.  CBG: No results for input(s): GLUCAP in the last 168 hours.  Radiological Exams on Admission: CT Angio Chest PE W and/or Wo Contrast Result Date: 05/29/2024 CLINICAL DATA:  Cough, shortness of breath and fever. EXAM: CT ANGIOGRAPHY CHEST WITH CONTRAST TECHNIQUE: Multidetector CT imaging of the chest was performed using the standard protocol during bolus administration of intravenous contrast. Multiplanar CT image reconstructions and MIPs were obtained to evaluate the vascular anatomy. RADIATION DOSE REDUCTION: This exam was performed according to the departmental dose-optimization program which includes automated exposure control, adjustment of the mA and/or kV according to patient size and/or use of iterative reconstruction technique. CONTRAST:  80mL OMNIPAQUE  IOHEXOL  350 MG/ML SOLN COMPARISON:  Radiograph earlier today.  Chest CT 03/30/2023 FINDINGS: Cardiovascular: There are no filling defects  within the pulmonary arteries to suggest pulmonary embolus. CardioMEMS in the left lower lobe pulmonary artery. Atherosclerosis of the thoracic aorta. No aortic aneurysm. Limited aortic assessment, but no findings to suggest acute aortic injury. 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 The heart is mildly enlarged. There are coronary artery calcifications. No pericardial effusion. Mediastinum/Nodes: Shotty mediastinal  lymph nodes not enlarged by size criteria. No hilar adenopathy. Patulous upper esophagus. Lungs/Pleura: Moderate emphysema. Diffuse bronchial thickening, greatest in the bilateral lower lobes and right upper lobe. Bandlike subsegmental scarring in the lower lobes. No confluent opacity. 4 mm left upper lobe pulmonary nodule, series 6, image 41, unchanged from prior exam. Punctate left upper lobe nodule series 6, image 56, also unchanged. No new pulmonary nodules. No pleural effusion. Upper Abdomen: Small cyst in the central liver. Broad-based upper abdominal ventral abdominal wall hernia containing portions of the stomach, partially included in the field of view. Cholecystectomy. No acute upper abdominal findings. Musculoskeletal: Degenerative change throughout the spine. Chronic shoulder arthropathy. There are no acute or suspicious osseous abnormalities. Review of the MIP images confirms the above findings. IMPRESSION: 1. No pulmonary embolus. 2. Moderate emphysema. Moderate bronchial thickening. Subsegmental bandlike atelectasis but no evidence of pneumonia. 3. Stable small left upper lobe pulmonary nodules, largest measuring 4 mm. These nodules can be considered benign and no specific imaging follow-up. Aortic Atherosclerosis (ICD10-I70.0) and Emphysema (ICD10-J43.9). Electronically Signed   By: Andrea Gasman M.D.   On: 05/29/2024 19:44   DG Chest 2 View Result Date: 05/29/2024 CLINICAL DATA:  Cough. EXAM: CHEST - 2 VIEW COMPARISON:  Chest CT dated 01/19/2024 FINDINGS:  Background of emphysema. There are bibasilar atelectasis/scarring. Pneumonia is not excluded. There is mild cardiomegaly. Left pectoral pacemaker device. No acute osseous pathology. IMPRESSION: Bibasilar atelectasis/scarring. Pneumonia is not excluded. Electronically Signed   By: Vanetta Chou M.D.   On: 05/29/2024 13:48   Assessment/Plan SUSANNA BENGE is a 78 y.o. 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    Influenza A, acute on chronic hypoxia-patient with cough, congestion, subjective fever.  Respiratory virus panel positive for influenza A.  Imaging has ruled out PE or consolidation. -Observation admission -Patient initially was requiring 7 L of nasal cannula, now weaned to her home baseline 4 L -Continue Tamiflu  -Continue scheduled and as needed breathing treatments  Acute exacerbation of COPD-due to influenza A as above, presented with wheezing, fever, productive cough.  Patient is now breathing better, on her baseline oxygen  without any active wheezing. -Treat influenza as above, scheduled and as needed breathing treatment  Persistent A-fib-monitor on telemetry, continue Tikosyn  and Eliquis  anticoagulation  Heart failure with preserved EF-patient appears euvolemic currently, continue home Aldactone , torsemide     Code Status: Full Code  Consults called: None  Admission status: Observation  Time spent: 48 minutes  Alben Jepsen CHRISTELLA Gail MD [facility] Pager [phone]  If 7PM-7AM, please contact night-coverage [email or web address] Password [facility]  05/30/2024, 7:51 AM      [1]  Allergies Allergen Reactions   Allopurinol Other (See Comments)    Reaction:  Dizziness    Clindamycin Anaphylaxis and Hives   Flublok [Influenza  Vaccine Recombinant] Other (See Comments)    Fever 103 with no alternative explanation day after vaccine.  Darice Highfill FNP-C   Pneumococcal 13-Val Conj Vacc Itching, Swelling and Rash   Dronedarone Rash   Montelukast  Other (See Comments)    Makes her loopy.   Brovana  [Arformoterol ]     Caused muscle pain   Budesonide      Caused extreme joint pain   Entresto  [Sacubitril -Valsartan ] Other (See Comments)    hypotension   Fosamax [Alendronate Sodium] Nausea  Only   Jardiance  [Empagliflozin ]     Caused a vaginal infection   Meperidine Nausea And Vomiting   Microplegia Msa-Msg [Plegisol]     Loopy,diarrhea   Rosuvastatin Other (See Comments)    Reaction:  Muscle spasms    Tetracycline Hives   Adhesive [Tape] Rash   Lovastatin Rash and Other (See Comments)    Muscle Pain   "

## 2024-05-30 NOTE — Progress Notes (Signed)
" °   05/30/24 2353  BiPAP/CPAP/SIPAP  BiPAP/CPAP/SIPAP Pt Type Adult  Reason BIPAP/CPAP not in use Other(comment) (pt refused)    "

## 2024-05-31 DIAGNOSIS — J441 Chronic obstructive pulmonary disease with (acute) exacerbation: Secondary | ICD-10-CM | POA: Diagnosis not present

## 2024-05-31 DIAGNOSIS — J101 Influenza due to other identified influenza virus with other respiratory manifestations: Secondary | ICD-10-CM | POA: Diagnosis not present

## 2024-05-31 LAB — MAGNESIUM: Magnesium: 2.1 mg/dL (ref 1.7–2.4)

## 2024-05-31 LAB — CBC
HCT: 36.4 % (ref 36.0–46.0)
Hemoglobin: 11.7 g/dL — ABNORMAL LOW (ref 12.0–15.0)
MCH: 27.8 pg (ref 26.0–34.0)
MCHC: 32.1 g/dL (ref 30.0–36.0)
MCV: 86.5 fL (ref 80.0–100.0)
Platelets: 143 K/uL — ABNORMAL LOW (ref 150–400)
RBC: 4.21 MIL/uL (ref 3.87–5.11)
RDW: 14.6 % (ref 11.5–15.5)
WBC: 4.3 K/uL (ref 4.0–10.5)
nRBC: 0 % (ref 0.0–0.2)

## 2024-05-31 MED ORDER — KETOROLAC TROMETHAMINE 15 MG/ML IJ SOLN
15.0000 mg | Freq: Three times a day (TID) | INTRAMUSCULAR | Status: DC | PRN
  Administered 2024-05-31 – 2024-06-02 (×4): 15 mg via INTRAVENOUS
  Filled 2024-05-31 (×4): qty 1

## 2024-05-31 MED ORDER — PANTOPRAZOLE SODIUM 40 MG PO TBEC
40.0000 mg | DELAYED_RELEASE_TABLET | Freq: Every day | ORAL | Status: DC
  Administered 2024-05-31 – 2024-06-02 (×3): 40 mg via ORAL
  Filled 2024-05-31 (×3): qty 1

## 2024-05-31 MED ORDER — PREDNISONE 50 MG PO TABS
50.0000 mg | ORAL_TABLET | Freq: Every day | ORAL | Status: DC
  Administered 2024-05-31 – 2024-06-02 (×3): 50 mg via ORAL
  Filled 2024-05-31 (×3): qty 1

## 2024-05-31 NOTE — Progress Notes (Signed)
 " PROGRESS NOTE  Kathleen [name] FMW:[id number] DOB: [birth year]   PCP: Joshua Debby CROME, MD  Patient is from: ILF.  Uses rollator for mobility.  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  She was started on Tamiflu  and breathing treatments.  The next day, continued to have myalgia significant respiratory symptoms.  Started on prednisone .   Subjective: Seen and examined earlier this morning.  No major events overnight or this morning.  Continues to report shortness of breath, dry cough, myalgia.  Denies nausea, vomiting or abdominal pain.   Assessment and plan: Acute COPD exacerbation due to influenza A infection: Presents with cough, congestion and fever.  RVP positive for influenza A.  CTA negative for PE or consolidation - Continue Tamiflu  - Add p.o. prednisone  50 mg daily - Continue Breo Ellipta  and Yupelri . - Continue DuoNebs as needed. - IS   Chronic respiratory failure with hypoxia: Stable on home 4 L. -Ambulatory saturation -Breathing treatments as above  Chronic HFpEF: Stable -Continue holding diuretics -Strict intake and output and daily weight   Persistent A-fib: Rate controlled. -Continue Tikosyn  and Eliquis .   Chronic pain -Continue Percocet. -Added Toradol   Generalized weakness/physical deconditioning - PT/OT eval  Class II obesity Body mass index is 36.86 kg/m.           DVT prophylaxis:   apixaban  (ELIQUIS ) tablet 5 mg  Code Status: Full code Family Communication: None at bedside Level of care: Progressive Status is: Inpatient Remains inpatient appropriate because:  Due to COPD exacerbation and influenza A infection   Final disposition: ILF   55 minutes with more than 50% spent in reviewing records, counseling patient/family and coordinating care.  Consultants:  None  Procedures: None  Microbiology summarized: COVID-19, influenza B and RSV nonreactive Influenza A reactive.  Objective: Vitals:   05/30/24 2021 05/31/24 0535 05/31/24 0859 05/31/24 1307  BP: (!) 153/77 134/73  123/61  Pulse: 71 70  70  Resp: 18 18    Temp: 100 F (37.8 C) 99.1 F (37.3 C)  98 F (36.7 C)  TempSrc: Oral Oral  Oral  SpO2: 100% 100% 99% 97%  Weight:      Height:        Examination:  GENERAL: No apparent distress.  Nontoxic. HEENT: MMM.  Vision and hearing grossly intact.  NECK: Supple.  No apparent JVD.  RESP:  No IWOB.  Fair aeration bilaterally.  Rhonchi bilaterally.  End expiratory wheeze. CVS:  RRR. Heart sounds normal.  ABD/GI/GU: BS+. Abd soft, NTND.  MSK/EXT:  Moves extremities. No apparent deformity. No edema.  SKIN: no apparent skin lesion or wound NEURO: AA.  Oriented appropriately.  No apparent focal neuro deficit. PSYCH: Calm. Normal affect.   Sch Meds:  Scheduled Meds:  apixaban   5 mg Oral BID   docusate sodium   100 mg Oral BID   dofetilide   250 mcg Oral BID   febuxostat   40 mg Oral Daily   ferrous gluconate   324 mg Oral Q breakfast   fluticasone  furoate-vilanterol  1 puff Inhalation Daily   gabapentin   300 mg Oral QHS   oseltamivir   30 mg Oral  BID   pantoprazole   40 mg Oral Daily   predniSONE   50 mg Oral Q breakfast   revefenacin   175 mcg Nebulization Daily   spironolactone   12.5 mg Oral QPM   Continuous Infusions: PRN Meds:.acetaminophen , guaiFENesin -dextromethorphan , HYDROmorphone  (DILAUDID ) injection, ipratropium-albuterol , ketorolac , metoCLOPramide  (REGLAN ) injection, oxyCODONE -acetaminophen , traZODone   Antimicrobials: Anti-infectives (From admission, onward)    Start     Dose/Rate Route Frequency Ordered Stop    05/30/24 1000  oseltamivir  (TAMIFLU ) capsule 30 mg        30 mg Oral 2 times daily 05/30/24 0438 06/03/24 2159   05/29/24 2200  oseltamivir  (TAMIFLU ) capsule 75 mg  Status:  Discontinued        75 mg Oral 2 times daily 05/29/24 2047 05/30/24 0438        I have personally reviewed the following labs and images: CBC: Recent Labs  Lab 05/29/24 1129 05/30/24 0723 05/31/24 0403  WBC 7.0 4.9 4.3  HGB 12.6 11.9* 11.7*  HCT 39.5 37.2 36.4  MCV 85.9 85.9 86.5  PLT 161 150 143*   BMP &GFR Recent Labs  Lab 05/29/24 1129 05/30/24 0723 05/30/24 1121 05/31/24 0403  NA 140 139  --   --   K 4.5 4.2  --   --   CL 104 104  --   --   CO2 26 25  --   --   GLUCOSE 120* 91  --   --   BUN 12 9  --   --   CREATININE 0.71 0.56  --   --   CALCIUM  10.0 9.2  --   --   MG  --   --  2.1 2.1   Estimated Creatinine Clearance: 57.2 mL/min (by C-G formula based on SCr of 0.56 mg/dL). Liver & Pancreas: No results for input(s): AST, ALT, ALKPHOS, BILITOT, PROT, ALBUMIN  in the last 168 hours. No results for input(s): LIPASE, AMYLASE in the last 168 hours. No results for input(s): AMMONIA in the last 168 hours. Diabetic: No results for input(s): HGBA1C in the last 72 hours. No results for input(s): GLUCAP in the last 168 hours. Cardiac Enzymes: No results for input(s): CKTOTAL, CKMB, CKMBINDEX, TROPONINI in the last 168 hours. No results for input(s): PROBNP in the last 8760 hours. Coagulation Profile: No results for input(s): INR, PROTIME in the last 168 hours. Thyroid  Function Tests: No results for input(s): TSH, T4TOTAL, FREET4, T3FREE, THYROIDAB in the last 72 hours. Lipid Profile: No results for input(s): CHOL, HDL, LDLCALC, TRIG, CHOLHDL, LDLDIRECT in the last 72 hours. Anemia Panel: No results for input(s): VITAMINB12, FOLATE, FERRITIN, TIBC, IRON , RETICCTPCT in the last 72 hours. Urine analysis:    Component Value  Date/Time   COLORURINE YELLOW 01/19/2024 1536   APPEARANCEUR CLOUDY (A) 01/19/2024 1536   APPEARANCEUR Clear 09/23/2023 1118   LABSPEC 1.013 01/19/2024 1536   LABSPEC 1.021 12/25/2012 1653   PHURINE 7.0 01/19/2024 1536   GLUCOSEU NEGATIVE 01/19/2024 1536   GLUCOSEU NEGATIVE 08/10/2023 1012   HGBUR MODERATE (A) 01/19/2024 1536   BILIRUBINUR NEGATIVE 01/19/2024 1536   BILIRUBINUR negative 12/06/2023 1719   BILIRUBINUR Negative 09/23/2023 1118   BILIRUBINUR Negative 12/25/2012 1653   KETONESUR NEGATIVE 01/19/2024 1536   PROTEINUR 30 (A) 01/19/2024 1536   UROBILINOGEN 0.2 12/06/2023 1719   UROBILINOGEN 0.2 08/10/2023 1012   NITRITE NEGATIVE 01/19/2024 1536   LEUKOCYTESUR LARGE (A) 01/19/2024 1536   LEUKOCYTESUR Negative 12/25/2012 1653   Sepsis Labs: Invalid input(s): PROCALCITONIN, LACTICIDVEN  Microbiology: Recent Results (from the past  240 hours)  Resp panel by RT-PCR (RSV, Flu A&B, Covid) Anterior Nasal Swab     Status: Abnormal   Collection Time: 05/29/24 11:28 AM   Specimen: Anterior Nasal Swab  Result Value Ref Range Status   SARS Coronavirus 2 by RT PCR NEGATIVE NEGATIVE Final    Comment: (NOTE) SARS-CoV-2 target nucleic acids are NOT DETECTED.  The SARS-CoV-2 RNA is generally detectable in upper respiratory specimens during the acute phase of infection. The lowest concentration of SARS-CoV-2 viral copies this assay can detect is 138 copies/mL. A negative result does not preclude SARS-Cov-2 infection and should not be used as the sole basis for treatment or other patient management decisions. A negative result may occur with  improper specimen collection/handling, submission of specimen other than nasopharyngeal swab, presence of viral mutation(s) within the areas targeted by this assay, and inadequate number of viral copies(<138 copies/mL). A negative result must be combined with clinical observations, patient history, and epidemiological information. The  expected result is Negative.  Fact Sheet for Patients:  bloggercourse.com  Fact Sheet for Healthcare Providers:  seriousbroker.it  This test is no t yet approved or cleared by the [place]  FDA and  has been authorized for detection and/or diagnosis of SARS-CoV-2 by FDA under an Emergency Use Authorization (EUA). This EUA will remain  in effect (meaning this test can be used) for the duration of the COVID-19 declaration under Section 564(b)(1) of the Act, 21 U.S.C.section 360bbb-3(b)(1), unless the authorization is terminated  or revoked sooner.       Influenza A by PCR POSITIVE (A) NEGATIVE Final   Influenza B by PCR NEGATIVE NEGATIVE Final    Comment: (NOTE) The Xpert Xpress SARS-CoV-2/FLU/RSV plus assay is intended as an aid in the diagnosis of influenza from Nasopharyngeal swab specimens and should not be used as a sole basis for treatment. Nasal washings and aspirates are unacceptable for Xpert Xpress SARS-CoV-2/FLU/RSV testing.  Fact Sheet for Patients: bloggercourse.com  Fact Sheet for Healthcare Providers: seriousbroker.it  This test is not yet approved or cleared by the [place]  FDA and has been authorized for detection and/or diagnosis of SARS-CoV-2 by FDA under an Emergency Use Authorization (EUA). This EUA will remain in effect (meaning this test can be used) for the duration of the COVID-19 declaration under Section 564(b)(1) of the Act, 21 U.S.C. section 360bbb-3(b)(1), unless the authorization is terminated or revoked.     Resp Syncytial Virus by PCR NEGATIVE NEGATIVE Final    Comment: (NOTE) Fact Sheet for Patients: bloggercourse.com  Fact Sheet for Healthcare Providers: seriousbroker.it  This test is not yet approved or cleared by the [place]  FDA and has been authorized for detection and/or  diagnosis of SARS-CoV-2 by FDA under an Emergency Use Authorization (EUA). This EUA will remain in effect (meaning this test can be used) for the duration of the COVID-19 declaration under Section 564(b)(1) of the Act, 21 U.S.C. section 360bbb-3(b)(1), unless the authorization is terminated or revoked.  Performed at [facility], [address], [place], [place] [address]     Radiology Studies: No results found.    Loi Rennaker T. Denice Cardon [facility]  If 7PM-7AM, please contact night-coverage [email or web address] 05/31/2024, 1:32 PM   "

## 2024-05-31 NOTE — Plan of Care (Signed)
" °  Problem: Clinical Measurements: Goal: Cardiovascular complication will be avoided Outcome: Progressing   Problem: Elimination: Goal: Will not experience complications related to bowel motility Outcome: Progressing   Problem: Elimination: Goal: Will not experience complications related to urinary retention Outcome: Progressing   Problem: Pain Managment: Goal: General experience of comfort will improve and/or be controlled Outcome: Progressing   "

## 2024-05-31 NOTE — Evaluation (Signed)
 Physical Therapy Evaluation-1x Patient Details Name: Kathleen [name] MRN: [id number] DOB: 1946-04-03 Today's Date: 05/31/2024  History of Present Illness  78 yo female admitted with flu. Hx of COPD-O2 dep 4L, HF, Afib, colostomy, pacemaker, gout, obsity, BPPV  Clinical Impression  On eval, pt was Mod Ind with mobility. She ambulated ~50 feet with a RW around her room. O2 94% on 4L [place] O2 with ambulation. Pt tolerated activity fairly well. Recommend daily ambulation with nursing and/or mobility team. Will recommend HHPT f/u. 1x eval.         If plan is discharge home, recommend the following:     Can travel by private vehicle        Equipment Recommendations None recommended by PT  Recommendations for Other Services       Functional Status Assessment Patient has had a recent decline in their functional status and demonstrates the ability to make significant improvements in function in a reasonable and predictable amount of time.     Precautions / Restrictions Precautions Precautions: None Precaution/Restrictions Comments: monitor O2-O2 dep 4L at baseline Restrictions Weight Bearing Restrictions Per Provider Order: No      Mobility  Bed Mobility               General bed mobility comments: oob in recliner    Transfers Overall transfer level: Needs assistance Equipment used: Rolling walker (2 wheels) Transfers: Sit to/from Stand Sit to Stand: Modified independent (Device/Increase time)           General transfer comment: increased time. cues for safety    Ambulation/Gait Ambulation/Gait assistance: Modified independent (Device/Increase time) Gait Distance (Feet): 50 Feet Assistive device: Rolling walker (2 wheels) Gait Pattern/deviations: Step-through pattern, Decreased stride length       General Gait Details: slow, steady gait with use of RW. No dizziness reported. O2 94% on 4L with ambulation. reported some fatigue but tolerated activity  well.  Stairs            Wheelchair Mobility     Tilt Bed    Modified Rankin (Stroke Patients Only)       Balance Overall balance assessment: Mild deficits observed, not formally tested                                           Pertinent Vitals/Pain Pain Assessment Pain Assessment: No/denies pain    Home Living Family/patient expects to be discharged to:: Private residence Living Arrangements: Alone Available Help at Discharge: Available PRN/intermittently Type of Home: Independent living facility Home Access: Level entry       Home Layout: One level Home Equipment: Rollator (4 wheels);Cane - single point;Shower seat;Grab bars - tub/shower;Hand held shower head;Other (comment);Adaptive equipment;BSC/3in1      Prior Function Prior Level of Function : Independent/Modified Independent             Mobility Comments: rollator outdoors       Extremity/Trunk Assessment   Upper Extremity Assessment Upper Extremity Assessment: Defer to OT evaluation    Lower Extremity Assessment Lower Extremity Assessment: Overall WFL for tasks assessed    Cervical / Trunk Assessment Cervical / Trunk Assessment: Normal  Communication   Communication Communication: No apparent difficulties    Cognition Arousal: Alert Behavior During Therapy: WFL for tasks assessed/performed   PT - Cognitive impairments: No apparent impairments  Following commands: Intact       Cueing Cueing Techniques: Verbal cues     General Comments      Exercises     Assessment/Plan    PT Assessment Patient needs continued PT services  PT Problem List Decreased strength;Decreased range of motion;Decreased activity tolerance;Decreased balance;Decreased mobility;Decreased knowledge of use of DME       PT Treatment Interventions DME instruction;Gait training;Functional mobility training;Therapeutic activities;Therapeutic  exercise;Patient/family education;Balance training    PT Goals (Current goals can be found in the Care Plan section)  Acute Rehab PT Goals Patient Stated Goal: home soon. feel better PT Goal Formulation: With patient/family Time For Goal Achievement: 06/14/24 Potential to Achieve Goals: Good    Frequency Min 3X/week     Co-evaluation               AM-PAC PT 6 Clicks Mobility  Outcome Measure Help needed turning from your back to your side while in a flat bed without using bedrails?: None Help needed moving from lying on your back to sitting on the side of a flat bed without using bedrails?: None Help needed moving to and from a bed to a chair (including a wheelchair)?: None Help needed standing up from a chair using your arms (e.g., wheelchair or bedside chair)?: None Help needed to walk in hospital room?: None Help needed climbing 3-5 steps with a railing? : A Little 6 Click Score: 23    End of Session Equipment Utilized During Treatment: Oxygen  Activity Tolerance: Patient tolerated treatment well Patient left: in chair;with call bell/phone within reach;with family/visitor present   PT Visit Diagnosis: Muscle weakness (generalized) (M62.81)    Time: 8882-8842 PT Time Calculation (min) (ACUTE ONLY): 40 min   Charges:   PT Evaluation $PT Eval Low Complexity: 1 Low PT Treatments $Gait Training: 23-37 mins PT General Charges $$ ACUTE PT VISIT: 1 Visit           Dannial SQUIBB, PT Acute Rehabilitation  Office: [phone]

## 2024-05-31 NOTE — Evaluation (Signed)
 Occupational Therapy Evaluation Patient Details Name: SUMAYAH BEARSE MRN: [id number] DOB: 1946-04-22 Today's Date: 05/31/2024   History of Present Illness   78 yr old female admitted with influenza A and COPD exacerbation. PMH: COPD-O2 dependent on 4L, heart block, a fib, colostomy, pacemaker, gout, arthritis, emphysema     Clinical Impressions The pt is currently presenting slightly below her baseline level of functioning for ADL management. She is limited by the below listed deficits (see OT problem list). During the session today, she required supervision for tasks, including lower body dressing seated EOB, sit to stand, and short distance ambulation using a RW. She reported feelings of generalized weakness and she was also noted to be with deconditioning. She used 4L O2 around the clock at her baseline, however she reported currently experiencing increased shortness of breath, as compared to typical for her. OT will follow her for services in the acute care setting. Home health OT is recommended.      If plan is discharge home, recommend the following:   Assistance with cooking/housework;Assist for transportation     Functional Status Assessment   Patient has had a recent decline in their functional status and demonstrates the ability to make significant improvements in function in a reasonable and predictable amount of time.     Equipment Recommendations   None recommended by OT     Recommendations for Other Services         Precautions/Restrictions   Restrictions Weight Bearing Restrictions Per Provider Order: No Other Position/Activity Restrictions: uses 4L O2 around the clock at baseline     Mobility Bed Mobility Overal bed mobility: Needs Assistance Bed Mobility: Supine to Sit     Supine to sit: Supervision, HOB elevated, Used rails          Transfers Overall transfer level: Needs assistance Equipment used: Rolling walker (2 wheels) Transfers:  Sit to/from Stand Sit to Stand: Supervision             Balance Overall balance assessment: Mild deficits observed, not formally tested         ADL either performed or assessed with clinical judgement   ADL Overall ADL's : Needs assistance/impaired Eating/Feeding: Independent;Sitting   Grooming: Set up;Supervision/safety;Standing   Upper Body Bathing: Set up;Sitting   Lower Body Bathing: Supervison/ safety;Set up;Sitting/lateral leans;Sit to/from stand   Upper Body Dressing : Set up;Sitting   Lower Body Dressing: Set up;Supervision/safety;Sitting/lateral leans Lower Body Dressing Details (indicate cue type and reason): pt doffed then donned her socks seated EOB Toilet Transfer: Set up;Supervision/safety;Ambulation;Rolling walker (2 wheels);Grab bars;Regular Toilet   Toileting- Clothing Manipulation and Hygiene: Supervision/safety;Set up;Sit to/from stand Toileting - Clothing Manipulation Details (indicate cue type and reason): at bathroom level based on clinical judgement             Vision   Additional Comments: She correctly read the time depicted on the wall  clock.            Pertinent Vitals/Pain Pain Assessment Pain Assessment: 0-10 Pain Score: 6  Pain Location: back Pain Intervention(s): Limited activity within patient's tolerance, Monitored during session, Patient requesting pain meds-RN notified, Repositioned     Extremity/Trunk Assessment Upper Extremity Assessment Upper Extremity Assessment: RUE deficits/detail;LUE deficits/detail RUE Deficits / Details: Significant chronic shoulder AROM limitations (prior surgery). Elbow and hand AROM WFL. Gross strength 4/5 to 4+/5 LUE Deficits / Details: AROM WFL. Gross strength 4/5 to 4+/5   Lower Extremity Assessment Lower Extremity Assessment: Overall WFL for tasks assessed  Communication Communication Communication: No apparent difficulties   Cognition Arousal: Alert Behavior During Therapy:  WFL for tasks assessed/performed Cognition: No apparent impairments             OT - Cognition Comments: Oriented x4                 Following commands: Intact       Cueing  General Comments   Cueing Techniques: Verbal cues              Home Living Family/patient expects to be discharged to:: Private residence Living Arrangements: Alone   Type of Home: Independent living facility [facility]) Home Access: Level entry     Home Layout: One level     Bathroom Shower/Tub: Walk-in shower         Home Equipment: Cane - single Librarian, Academic (2 wheels);Rollator (4 wheels);Shower seat   Additional Comments: 4L O2 baseline      Prior Functioning/Environment Prior Level of Function : Independent/Modified Independent             Mobility Comments:  (She used a rollator for ambulation outside the home.) ADLs Comments: Modified independent to independent with ADLs and cooking. She does not drive and the staff at the ILF clean.    OT Problem List: Decreased strength;Decreased activity tolerance;Cardiopulmonary status limiting activity;Pain   OT Treatment/Interventions: Self-care/ADL training;Therapeutic exercise;Energy conservation;DME and/or AE instruction;Therapeutic activities;Balance training;Patient/family education      OT Goals(Current goals can be found in the care plan section)   Acute Rehab OT Goals OT Goal Formulation: With patient Time For Goal Achievement: 06/14/24 Potential to Achieve Goals: Good ADL Goals Pt Will Perform Grooming: with modified independence;standing Pt Will Perform Lower Body Dressing: with modified independence;sit to/from stand Pt Will Transfer to Toilet: with modified independence;ambulating;grab bars;regular height toilet Pt Will Perform Toileting - Clothing Manipulation and hygiene: with modified independence;sit to/from stand   OT Frequency:  Min 2X/week       AM-PAC OT 6 Clicks Daily Activity      Outcome Measure Help from another person eating meals?: None Help from another person taking care of personal grooming?: A Little Help from another person toileting, which includes using toliet, bedpan, or urinal?: A Little Help from another person bathing (including washing, rinsing, drying)?: A Little Help from another person to put on and taking off regular upper body clothing?: None Help from another person to put on and taking off regular lower body clothing?: A Little 6 Click Score: 20   End of Session Equipment Utilized During Treatment: Gait belt;Rolling walker (2 wheels);Oxygen  Nurse Communication: Mobility status  Activity Tolerance: Patient tolerated treatment well Patient left: in bed;with call bell/phone within reach  OT Visit Diagnosis: Muscle weakness (generalized) (M62.81)                Time: 8369-8345 OT Time Calculation (min): 24 min Charges:  OT General Charges $OT Visit: 1 Visit OT Evaluation $OT Eval Low Complexity: 1 Low    Delanna JINNY Lesches, OTR/L 05/31/2024, 5:55 PM

## 2024-06-01 DIAGNOSIS — J101 Influenza due to other identified influenza virus with other respiratory manifestations: Secondary | ICD-10-CM | POA: Diagnosis not present

## 2024-06-01 DIAGNOSIS — J441 Chronic obstructive pulmonary disease with (acute) exacerbation: Secondary | ICD-10-CM | POA: Diagnosis not present

## 2024-06-01 LAB — MAGNESIUM: Magnesium: 2.4 mg/dL (ref 1.7–2.4)

## 2024-06-01 MED ORDER — AZELASTINE HCL 0.1 % NA SOLN
2.0000 | Freq: Two times a day (BID) | NASAL | Status: DC
  Administered 2024-06-01 – 2024-06-02 (×3): 2 via NASAL
  Filled 2024-06-01: qty 30

## 2024-06-01 NOTE — Progress Notes (Signed)
" °   06/01/24 0901  TOC Brief Assessment  Insurance and Status Reviewed  Patient has primary care physician Yes  Home environment has been reviewed From Home  Prior level of function: Independeent/[facility]  Prior/Current Home Services No current home services  Social Drivers of Health Review SDOH reviewed no interventions necessary  Readmission risk has been reviewed Yes  Transition of care needs no transition of care needs at this time (Refreed to HHPT/Accepted)   CSW spoke with pt, stated that she is open to HHPT at [facility].  CSW explored options/choice, pt stated that she used a company before but is open to anyone.  No particular Choice.  CSW agreed to try to secure  a provider.  CS[facility] stated that they are outsider provider of choice for that facility as well. Adding to AVS, no further ICM needs at this time.  "

## 2024-06-01 NOTE — Progress Notes (Signed)
 " PROGRESS NOTE  Kathleen [name] FMW:[id number] DOB: [birth year]   PCP: Joshua Debby CROME, MD  Patient is from: ILF.  Uses rollator for mobility.  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  She was started on Tamiflu  and breathing treatments.  The next day, continued to have myalgia significant respiratory symptoms.  Started on prednisone .  Slowly improving.  Subjective: Seen and examined earlier this morning.  No major events overnight of this morning.  Continues to endorse shortness of breath and cough.  Also complains of back pain.  Very uncomfortable in the bed.  Daughter at bedside.   Assessment and plan: Acute COPD exacerbation due to influenza A infection: Presents with cough, congestion and fever.  RVP positive for influenza A.  CTA negative for PE or consolidation.  Very rhonchorous on exam. - Continue Tamiflu  and prednisone  - Continue Breo Ellipta  and Yupelri . - Continue DuoNebs as needed. - IS, OOB, PT/OT   Chronic respiratory failure with hypoxia: Stable on home 4 L.  Acute respiratory failure ruled out. -Ambulatory saturation -Breathing treatments as above  Chronic HFpEF: Stable -Continue holding diuretics -Strict intake and output and daily weight   Persistent A-fib: Rate controlled. -Continue Tikosyn  and Eliquis .   Chronic back and shoulder pain -Continue Percocet. -Added Toradol   Generalized weakness/physical deconditioning - PT/OT eval  Class II obesity Body mass index is 36.86 kg/m.           DVT prophylaxis:   apixaban  (ELIQUIS ) tablet 5 mg  Code Status: Full  code Family Communication: Updated patient's daughter at bedside. Level of care: Progressive Status is: Inpatient Remains inpatient appropriate because: Due to COPD exacerbation and influenza A infection   Final disposition: ILF   55 minutes with more than 50% spent in reviewing records, counseling patient/family and coordinating care.  Consultants:  None  Procedures: None  Microbiology summarized: COVID-19, influenza B and RSV nonreactive Influenza A reactive.  Objective: Vitals:   05/31/24 1307 05/31/24 1951 06/01/24 0420 06/01/24 1022  BP: 123/61 (!) 161/71 137/65   Pulse: 70 71 69   Resp:  15 16   Temp: 98 F (36.7 C) 97.8 F (36.6 C) 98 F (36.7 C)   TempSrc: Oral Oral    SpO2: 97% 92% 100% 94%  Weight:      Height:        Examination:  GENERAL: No apparent distress.  Nontoxic. HEENT: MMM.  Vision and hearing grossly intact.  NECK: Supple.  No apparent JVD.  RESP:  No IWOB.  Very rhonchorous. CVS:  RRR. Heart sounds normal.  ABD/GI/GU: BS+. Abd soft, NTND.  MSK/EXT:  Moves extremities. No apparent deformity. No edema.  SKIN: no apparent skin lesion or wound NEURO: AA.  Oriented appropriately.  No apparent focal neuro deficit. PSYCH: Calm. Normal affect.   Sch Meds:  Scheduled Meds:  apixaban   5 mg Oral BID   azelastine   2 spray Each Nare BID   docusate sodium   100 mg Oral BID   dofetilide   250 mcg Oral BID   febuxostat   40 mg Oral Daily   ferrous gluconate   324 mg Oral Q breakfast  fluticasone  furoate-vilanterol  1 puff Inhalation Daily   gabapentin   300 mg Oral QHS   oseltamivir   30 mg Oral BID   pantoprazole   40 mg Oral Daily   predniSONE   50 mg Oral Q breakfast   revefenacin   175 mcg Nebulization Daily   Continuous Infusions: PRN Meds:.acetaminophen , guaiFENesin -dextromethorphan , HYDROmorphone  (DILAUDID ) injection, ipratropium-albuterol , ketorolac , metoCLOPramide  (REGLAN ) injection, oxyCODONE -acetaminophen ,  traZODone   Antimicrobials: Anti-infectives (From admission, onward)    Start     Dose/Rate Route Frequency Ordered Stop   05/30/24 1000  oseltamivir  (TAMIFLU ) capsule 30 mg        30 mg Oral 2 times daily 05/30/24 0438 06/03/24 2159   05/29/24 2200  oseltamivir  (TAMIFLU ) capsule 75 mg  Status:  Discontinued        75 mg Oral 2 times daily 05/29/24 2047 05/30/24 0438        I have personally reviewed the following labs and images: CBC: Recent Labs  Lab 05/29/24 1129 05/30/24 0723 05/31/24 0403  WBC 7.0 4.9 4.3  HGB 12.6 11.9* 11.7*  HCT 39.5 37.2 36.4  MCV 85.9 85.9 86.5  PLT 161 150 143*   BMP &GFR Recent Labs  Lab 05/29/24 1129 05/30/24 0723 05/30/24 1121 05/31/24 0403 06/01/24 0349  NA 140 139  --   --   --   K 4.5 4.2  --   --   --   CL 104 104  --   --   --   CO2 26 25  --   --   --   GLUCOSE 120* 91  --   --   --   BUN 12 9  --   --   --   CREATININE 0.71 0.56  --   --   --   CALCIUM  10.0 9.2  --   --   --   MG  --   --  2.1 2.1 2.4   Estimated Creatinine Clearance: 57.2 mL/min (by C-G formula based on SCr of 0.56 mg/dL). Liver & Pancreas: No results for input(s): AST, ALT, ALKPHOS, BILITOT, PROT, ALBUMIN  in the last 168 hours. No results for input(s): LIPASE, AMYLASE in the last 168 hours. No results for input(s): AMMONIA in the last 168 hours. Diabetic: No results for input(s): HGBA1C in the last 72 hours. No results for input(s): GLUCAP in the last 168 hours. Cardiac Enzymes: No results for input(s): CKTOTAL, CKMB, CKMBINDEX, TROPONINI in the last 168 hours. No results for input(s): PROBNP in the last 8760 hours. Coagulation Profile: No results for input(s): INR, PROTIME in the last 168 hours. Thyroid  Function Tests: No results for input(s): TSH, T4TOTAL, FREET4, T3FREE, THYROIDAB in the last 72 hours. Lipid Profile: No results for input(s): CHOL, HDL, LDLCALC, TRIG, CHOLHDL, LDLDIRECT in  the last 72 hours. Anemia Panel: No results for input(s): VITAMINB12, FOLATE, FERRITIN, TIBC, IRON , RETICCTPCT in the last 72 hours. Urine analysis:    Component Value Date/Time   COLORURINE YELLOW 01/19/2024 1536   APPEARANCEUR CLOUDY (A) 01/19/2024 1536   APPEARANCEUR Clear 09/23/2023 1118   LABSPEC 1.013 01/19/2024 1536   LABSPEC 1.021 12/25/2012 1653   PHURINE 7.0 01/19/2024 1536   GLUCOSEU NEGATIVE 01/19/2024 1536   GLUCOSEU NEGATIVE 08/10/2023 1012   HGBUR MODERATE (A) 01/19/2024 1536   BILIRUBINUR NEGATIVE 01/19/2024 1536   BILIRUBINUR negative 12/06/2023 1719   BILIRUBINUR Negative 09/23/2023 1118   BILIRUBINUR Negative 12/25/2012 1653   KETONESUR NEGATIVE 01/19/2024 1536   PROTEINUR 30 (A) 01/19/2024 1536   UROBILINOGEN 0.2 12/06/2023  1719   UROBILINOGEN 0.2 08/10/2023 1012   NITRITE NEGATIVE 01/19/2024 1536   LEUKOCYTESUR LARGE (A) 01/19/2024 1536   LEUKOCYTESUR Negative 12/25/2012 1653   Sepsis Labs: Invalid input(s): PROCALCITONIN, LACTICIDVEN  Microbiology: Recent Results (from the past 240 hours)  Resp panel by RT-PCR (RSV, Flu A&B, Covid) Anterior Nasal Swab     Status: Abnormal   Collection Time: 05/29/24 11:28 AM   Specimen: Anterior Nasal Swab  Result Value Ref Range Status   SARS Coronavirus 2 by RT PCR NEGATIVE NEGATIVE Final    Comment: (NOTE) SARS-CoV-2 target nucleic acids are NOT DETECTED.  The SARS-CoV-2 RNA is generally detectable in upper respiratory specimens during the acute phase of infection. The lowest concentration of SARS-CoV-2 viral copies this assay can detect is 138 copies/mL. A negative result does not preclude SARS-Cov-2 infection and should not be used as the sole basis for treatment or other patient management decisions. A negative result may occur with  improper specimen collection/handling, submission of specimen other than nasopharyngeal swab, presence of viral mutation(s) within the areas targeted by this  assay, and inadequate number of viral copies(<138 copies/mL). A negative result must be combined with clinical observations, patient history, and epidemiological information. The expected result is Negative.  Fact Sheet for Patients:  bloggercourse.com  Fact Sheet for Healthcare Providers:  seriousbroker.it  This test is no t yet approved or cleared by the [place]  FDA and  has been authorized for detection and/or diagnosis of SARS-CoV-2 by FDA under an Emergency Use Authorization (EUA). This EUA will remain  in effect (meaning this test can be used) for the duration of the COVID-19 declaration under Section 564(b)(1) of the Act, 21 U.S.C.section 360bbb-3(b)(1), unless the authorization is terminated  or revoked sooner.       Influenza A by PCR POSITIVE (A) NEGATIVE Final   Influenza B by PCR NEGATIVE NEGATIVE Final    Comment: (NOTE) The Xpert Xpress SARS-CoV-2/FLU/RSV plus assay is intended as an aid in the diagnosis of influenza from Nasopharyngeal swab specimens and should not be used as a sole basis for treatment. Nasal washings and aspirates are unacceptable for Xpert Xpress SARS-CoV-2/FLU/RSV testing.  Fact Sheet for Patients: bloggercourse.com  Fact Sheet for Healthcare Providers: seriousbroker.it  This test is not yet approved or cleared by the [place]  FDA and has been authorized for detection and/or diagnosis of SARS-CoV-2 by FDA under an Emergency Use Authorization (EUA). This EUA will remain in effect (meaning this test can be used) for the duration of the COVID-19 declaration under Section 564(b)(1) of the Act, 21 U.S.C. section 360bbb-3(b)(1), unless the authorization is terminated or revoked.     Resp Syncytial Virus by PCR NEGATIVE NEGATIVE Final    Comment: (NOTE) Fact Sheet for Patients: bloggercourse.com  Fact Sheet  for Healthcare Providers: seriousbroker.it  This test is not yet approved or cleared by the [place]  FDA and has been authorized for detection and/or diagnosis of SARS-CoV-2 by FDA under an Emergency Use Authorization (EUA). This EUA will remain in effect (meaning this test can be used) for the duration of the COVID-19 declaration under Section 564(b)(1) of the Act, 21 U.S.C. section 360bbb-3(b)(1), unless the authorization is terminated or revoked.  Performed at [facility], [address], [place], [place] [address]     Radiology Studies: No results found.    Erie Sica T. Eran Windish [facility]  If 7PM-7AM, please contact night-coverage [email or web address] 06/01/2024, 12:49 PM   "

## 2024-06-01 NOTE — Progress Notes (Signed)
" °   06/01/24 2225  BiPAP/CPAP/SIPAP  BiPAP/CPAP/SIPAP Pt Type Adult  Reason BIPAP/CPAP not in use Non-compliant (Patient refused BiPAP qhs.  Patient states that she is unable to tolerate it and prefers to sleep with her O2 in.)  BiPAP/CPAP /SiPAP Vitals  Resp 20  MEWS Score/Color  MEWS Score 0  MEWS Score Color Green    "

## 2024-06-02 ENCOUNTER — Other Ambulatory Visit (HOSPITAL_COMMUNITY): Payer: Self-pay

## 2024-06-02 DIAGNOSIS — J101 Influenza due to other identified influenza virus with other respiratory manifestations: Secondary | ICD-10-CM | POA: Diagnosis not present

## 2024-06-02 DIAGNOSIS — J9611 Chronic respiratory failure with hypoxia: Secondary | ICD-10-CM

## 2024-06-02 DIAGNOSIS — J441 Chronic obstructive pulmonary disease with (acute) exacerbation: Secondary | ICD-10-CM | POA: Diagnosis not present

## 2024-06-02 LAB — CBC
HCT: 36.5 % (ref 36.0–46.0)
Hemoglobin: 11.7 g/dL — ABNORMAL LOW (ref 12.0–15.0)
MCH: 27.3 pg (ref 26.0–34.0)
MCHC: 32.1 g/dL (ref 30.0–36.0)
MCV: 85.3 fL (ref 80.0–100.0)
Platelets: 159 K/uL (ref 150–400)
RBC: 4.28 MIL/uL (ref 3.87–5.11)
RDW: 13.7 % (ref 11.5–15.5)
WBC: 6.1 K/uL (ref 4.0–10.5)
nRBC: 0 % (ref 0.0–0.2)

## 2024-06-02 LAB — RENAL FUNCTION PANEL
Albumin: 3.7 g/dL (ref 3.5–5.0)
Anion gap: 11 (ref 5–15)
BUN: 16 mg/dL (ref 8–23)
CO2: 25 mmol/L (ref 22–32)
Calcium: 9.8 mg/dL (ref 8.9–10.3)
Chloride: 104 mmol/L (ref 98–111)
Creatinine, Ser: 0.6 mg/dL (ref 0.44–1.00)
GFR, Estimated: >60 mL/min
Glucose, Bld: 184 mg/dL — ABNORMAL HIGH (ref 70–99)
Phosphorus: 2.7 mg/dL (ref 2.5–4.6)
Potassium: 4.4 mmol/L (ref 3.5–5.1)
Sodium: 140 mmol/L (ref 135–145)

## 2024-06-02 LAB — MAGNESIUM: Magnesium: 2.3 mg/dL (ref 1.7–2.4)

## 2024-06-02 MED ORDER — OXYCODONE-ACETAMINOPHEN 5-325 MG PO TABS
1.0000 | ORAL_TABLET | Freq: Three times a day (TID) | ORAL | Status: DC | PRN
  Administered 2024-06-02: 1 via ORAL
  Filled 2024-06-02: qty 1

## 2024-06-02 MED ORDER — OSELTAMIVIR PHOSPHATE 30 MG PO CAPS
30.0000 mg | ORAL_CAPSULE | Freq: Two times a day (BID) | ORAL | 0 refills | Status: AC
  Filled 2024-06-02: qty 3, 2d supply, fill #0

## 2024-06-02 MED ORDER — PREDNISONE 50 MG PO TABS
50.0000 mg | ORAL_TABLET | Freq: Every day | ORAL | 0 refills | Status: DC
  Filled 2024-06-02: qty 2, 2d supply, fill #0

## 2024-06-02 NOTE — Progress Notes (Signed)
 Discharge instructions reviewed with patient, verbalized understanding. All questions answered. All belongings accounted for. Patient to follow up with MD in  1-2 weeks. PIV removed. Assisted via WC to private vehicle.

## 2024-06-02 NOTE — Plan of Care (Signed)
" °  Problem: Education: Goal: Knowledge of General Education information will improve Description: Including pain rating scale, medication(s)/side effects and non-pharmacologic comfort measures 06/02/2024 0757 by Alaina Dozier PARAS, RN Outcome: Adequate for Discharge 06/02/2024 0754 by Alaina Dozier PARAS, RN Outcome: Progressing   Problem: Health Behavior/Discharge Planning: Goal: Ability to manage health-related needs will improve 06/02/2024 0757 by Alaina Dozier PARAS, RN Outcome: Adequate for Discharge 06/02/2024 0754 by Alaina Dozier PARAS, RN Outcome: Progressing   Problem: Clinical Measurements: Goal: Ability to maintain clinical measurements within normal limits will improve 06/02/2024 0757 by Alaina Dozier PARAS, RN Outcome: Adequate for Discharge 06/02/2024 0754 by Alaina Dozier PARAS, RN Outcome: Progressing Goal: Will remain free from infection 06/02/2024 0757 by Alaina Dozier PARAS, RN Outcome: Adequate for Discharge 06/02/2024 0754 by Alaina Dozier PARAS, RN Outcome: Progressing Goal: Diagnostic test results will improve 06/02/2024 0757 by Alaina Dozier PARAS, RN Outcome: Adequate for Discharge 06/02/2024 0754 by Alaina Dozier PARAS, RN Outcome: Progressing Goal: Respiratory complications will improve Outcome: Adequate for Discharge Goal: Cardiovascular complication will be avoided Outcome: Adequate for Discharge   Problem: Activity: Goal: Risk for activity intolerance will decrease Outcome: Adequate for Discharge   Problem: Nutrition: Goal: Adequate nutrition will be maintained Outcome: Adequate for Discharge   Problem: Coping: Goal: Level of anxiety will decrease Outcome: Adequate for Discharge   Problem: Elimination: Goal: Will not experience complications related to bowel motility Outcome: Adequate for Discharge Goal: Will not experience complications related to urinary retention Outcome: Adequate for Discharge   Problem: Pain  Managment: Goal: General experience of comfort will improve and/or be controlled Outcome: Adequate for Discharge   Problem: Safety: Goal: Ability to remain free from injury will improve Outcome: Adequate for Discharge   Problem: Skin Integrity: Goal: Risk for impaired skin integrity will decrease Outcome: Adequate for Discharge   Problem: Education: Goal: Knowledge of disease or condition will improve Outcome: Adequate for Discharge Goal: Understanding of medication regimen will improve Outcome: Adequate for Discharge Goal: Individualized Educational Video(s) Outcome: Adequate for Discharge   Problem: Activity: Goal: Ability to tolerate increased activity will improve Outcome: Adequate for Discharge   Problem: Cardiac: Goal: Ability to achieve and maintain adequate cardiopulmonary perfusion will improve Outcome: Adequate for Discharge   Problem: Health Behavior/Discharge Planning: Goal: Ability to safely manage health-related needs after discharge will improve Outcome: Adequate for Discharge   "

## 2024-06-02 NOTE — Discharge Summary (Signed)
 "  Physician Discharge Summary  Kathleen [name] FMW:[id number] DOB: 01-Feb-1946 DOA: 05/29/2024  PCP: Joshua Debby CROME, MD  Admit date: 05/29/2024 Discharge date: 06/02/2024  Admitted From: ILF Disposition: ILF Recommendations for Outpatient Follow-up:  Outpatient follow-up with PCP in 1 to 2 weeks Check CMP and CBC at follow-up Please follow up on the following pending results: None  Home Health: PT and OT Equipment/Devices: Patient has home oxygen , 4 L by [employer].  Discharge Condition: Stable CODE STATUS: Full code   Contact information for follow-up providers     Care, [facility] Follow up.   Specialty: Home Health Services Why: Will call you to schedule first home health physical therapy once discharged home. Contact information: [address] [place] [address] [phone]         Joshua Debby CROME, MD. Schedule an appointment as soon as possible for a visit in 1 week(s).   Specialty: Internal Medicine Contact information: [address] [place] [address] [phone]              Contact information for after-discharge care     Home Medical Care     [facility]) .   Service: Home Health Services Contact information: 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  She was started on Tamiflu  and breathing treatments.   The next day, continued to have myalgia significant respiratory symptoms.  Started on prednisone .   On the day of discharge, significant improvement in her symptoms.  Maintain saturation of 89% on room air at rest but desaturated to 87% with  ambulation.  She recovered to 96% on home 4 L.  She is discharged on Tamiflu  and prednisone  to complete 5 days course.  She is already optimized on COPD inhalers.  See individual problem list below for more.   Problems addressed during this hospitalization Acute COPD exacerbation due to influenza A infection: Presents with cough, congestion and fever.  RVP positive for influenza A.  CTA negative for PE or consolidation.  -Continue Tamiflu  for 3 more doses. -P.o. prednisone  for 2 more days -Continue home triple inhalers  Chronic respiratory failure with hypoxia: Stable on home 4 L.  Acute respiratory failure ruled out. - Management as above.   Chronic HFpEF: Stable - Continue home diuretics.   Persistent A-fib: Rate controlled. -Continue Tikosyn  and Eliquis .   Chronic back and shoulder pain - Continue home meds.   Generalized weakness/physical deconditioning - HHPT/OT ordered.   Class II obesity Body mass index is 36.86 kg/m.           Consultations:  Time spent 35  minutes  Vital signs Vitals:   06/01/24 2129 06/01/24 2225 06/02/24 0530 06/02/24 0947  BP: (!) 157/71  135/70   Pulse: 71  69   Temp: 99.3 F (37.4 C)  97.6 F (36.4 C)   Resp: (!) 24 20 12    Height:      Weight:      SpO2: 99%  100% 100%  TempSrc: Oral  Oral   BMI (Calculated):  Discharge exam  GENERAL: No apparent distress.  Nontoxic. HEENT: MMM.  Vision and hearing grossly intact.  NECK: Supple.  No apparent JVD.  RESP:  No IWOB.  Fair aeration bilaterally. CVS:  RRR. Heart sounds normal.  ABD/GI/GU: BS+. Abd soft, NTND.  MSK/EXT:  Moves extremities. No apparent deformity. No edema.  SKIN: no apparent skin lesion or wound NEURO: Awake and alert. Oriented appropriately.  No apparent focal neuro deficit. PSYCH: Calm. Normal affect.   Discharge Instructions Discharge Instructions     Discharge instructions   Complete by: As directed    It has been a pleasure taking care of  you!  You were hospitalized due to influenza A infection for which you have been started on medication.  Your symptoms improved.  We are discharging you on more Tamiflu  and prednisone  to complete treatment course.  Continue your inhalers.  Follow-up with your primary care doctor in 1 to 2 weeks or sooner if needed.   Take care,   Increase activity slowly   Complete by: As directed       Allergies as of 06/02/2024       Reactions   Allopurinol Other (See Comments)   Reaction:  Dizziness    Clindamycin Anaphylaxis, Hives   Flublok [influenza Vaccine Recombinant] Other (See Comments)   Fever 103 with no alternative explanation day after vaccine.  Darice Highfill FNP-C   Pneumococcal 13-val Conj Vacc Itching, Swelling, Rash   Dronedarone Rash   Montelukast  Other (See Comments)   Makes her loopy.   Brovana  [arformoterol ]    Caused muscle pain   Budesonide     Caused extreme joint pain   Entresto  [sacubitril -valsartan ] Other (See Comments)   hypotension   Fosamax [alendronate Sodium] Nausea Only   Jardiance  [empagliflozin ]    Caused a vaginal infection   Meperidine Nausea And Vomiting   Microplegia Msa-msg [plegisol]    Loopy,diarrhea   Rosuvastatin Other (See Comments)   Reaction:  Muscle spasms    Tetracycline Hives   Adhesive [tape] Rash   Lovastatin Rash, Other (See Comments)   Muscle Pain        Medication List     TAKE these medications    azelastine  0.1 % nasal spray Commonly known as: ASTELIN  Place 2 sprays into both nostrils 2 (two) times daily.   Breo Ellipta  100-25 MCG/ACT Aepb Generic drug: fluticasone  furoate-vilanterol inhale ONE PUFF into THE lungs daily. What changed: See the new instructions.   cyanocobalamin  1000 MCG tablet Commonly known as: VITAMIN B12 Take 1,000 mcg by mouth daily.   docusate sodium  100 MG capsule Commonly known as: COLACE Take 100 mg by mouth 2 (two) times daily.   dofetilide  250 MCG capsule Commonly known as:  TIKOSYN  TAKE ONE CAPSULE BY MOUTH EVERY MORNING AND AT BEDTIME. What changed: See the new instructions.   Eliquis  5 MG Tabs tablet Generic drug: apixaban  Take 1 tablet (5 mg total) by mouth 2 (two) times daily.   febuxostat  40 MG tablet Commonly known as: ULORIC  Take 1 tablet (40 mg total) by mouth daily.   ferrous gluconate  324 MG tablet Commonly known as: FERGON Take 1 tablet (324 mg total) by mouth daily with breakfast.   gabapentin  300 MG capsule Commonly known as: Neurontin  Take 1 capsule (300 mg total) by mouth at bedtime.   ketoconazole  2 % cream Commonly known as: NIZORAL  Apply 1 Application topically 2 (two) times daily. What changed:  when to take this reasons to take this   Magnesium  500  MG Tabs Take 500 mg by mouth every evening.   mupirocin  ointment 2 % Commonly known as: BACTROBAN  Apply 1 Application topically 2 (two) times daily.   naloxone  4 MG/0.1ML Liqd nasal spray kit Commonly known as: NARCAN  Place 1 spray into the nose once as needed (opioid overdose).   oseltamivir  30 MG capsule Commonly known as: TAMIFLU  Take 1 capsule (30 mg total) by mouth 2 (two) times daily for 3 doses.   oxyCODONE -acetaminophen  10-325 MG tablet Commonly known as: PERCOCET Take 0.5-1 tablets by mouth See admin instructions. *1-3 times daily* What changed: Another medication with the same name was removed. Continue taking this medication, and follow the directions you see here.   OXYGEN  Inhale 4 L into the lungs continuous. Use with resmed ventilator   potassium chloride  10 MEQ tablet Commonly known as: KLOR-CON  Take 4 tablets (40 mEq total) by mouth daily.   predniSONE  50 MG tablet Commonly known as: DELTASONE  Take 1 tablet (50 mg total) by mouth daily with breakfast. Start taking on: June 03, 2024   revefenacin  175 MCG/3ML nebulizer solution Commonly known as: YUPELRI  Take 175 mcg by nebulization daily.   spironolactone  25 MG tablet Commonly known as:  ALDACTONE  Take 1 tablet (25 mg total) by mouth every evening. What changed: how much to take   torsemide  20 MG tablet Commonly known as: DEMADEX  Take 2 tablets (40 mg total) by mouth daily.   triamcinolone  cream 0.1 % Commonly known as: KENALOG  Apply 1 Application topically 2 (two) times daily.   Vitamin D3 50 MCG (2000 UT) Tabs Take 2,000 Units by mouth at bedtime.         Procedures/Studies:   CT Angio Chest PE W and/or Wo Contrast Result Date: 05/29/2024 CLINICAL DATA:  Cough, shortness of breath and fever. EXAM: CT ANGIOGRAPHY CHEST WITH CONTRAST TECHNIQUE: Multidetector CT imaging of the chest was performed using the standard protocol during bolus administration of intravenous contrast. Multiplanar CT image reconstructions and MIPs were obtained to evaluate the vascular anatomy. RADIATION DOSE REDUCTION: This exam was performed according to the departmental dose-optimization program which includes automated exposure control, adjustment of the mA and/or kV according to patient size and/or use of iterative reconstruction technique. CONTRAST:  80mL OMNIPAQUE  IOHEXOL  350 MG/ML SOLN COMPARISON:  Radiograph earlier today.  Chest CT 03/30/2023 FINDINGS: Cardiovascular: There are no filling defects within the pulmonary arteries to suggest pulmonary embolus. CardioMEMS in the left lower lobe pulmonary artery. Atherosclerosis of the thoracic aorta. No aortic aneurysm. Limited aortic assessment, but no findings to suggest acute aortic injury. 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 The heart is mildly enlarged. There are coronary artery calcifications. No pericardial effusion. Mediastinum/Nodes: Shotty mediastinal lymph nodes not enlarged by size criteria. No hilar adenopathy. Patulous upper esophagus. Lungs/Pleura: Moderate emphysema. Diffuse bronchial thickening, greatest in the bilateral lower lobes and right upper lobe. Bandlike subsegmental scarring in the lower lobes. No  confluent opacity. 4 mm left upper lobe pulmonary nodule, series 6, image 41, unchanged from prior exam. Punctate left upper lobe nodule series 6, image 56, also unchanged. No new pulmonary nodules. No pleural effusion. Upper Abdomen: Small cyst in the central liver. Broad-based upper abdominal ventral abdominal wall hernia containing portions of the stomach, partially included in the field of view. Cholecystectomy. No acute upper abdominal findings. Musculoskeletal: Degenerative change throughout the spine. Chronic shoulder arthropathy. There are no acute or suspicious osseous abnormalities. Review of the MIP images confirms the above findings. IMPRESSION: 1. No pulmonary embolus.  2. Moderate emphysema. Moderate bronchial thickening. Subsegmental bandlike atelectasis but no evidence of pneumonia. 3. Stable small left upper lobe pulmonary nodules, largest measuring 4 mm. These nodules can be considered benign and no specific imaging follow-up. Aortic Atherosclerosis (ICD10-I70.0) and Emphysema (ICD10-J43.9). Electronically Signed   By: Andrea Gasman M.D.   On: 05/29/2024 19:44   DG Chest 2 View Result Date: 05/29/2024 CLINICAL DATA:  Cough. EXAM: CHEST - 2 VIEW COMPARISON:  Chest CT dated 01/19/2024 FINDINGS: Background of emphysema. There are bibasilar atelectasis/scarring. Pneumonia is not excluded. There is mild cardiomegaly. Left pectoral pacemaker device. No acute osseous pathology. IMPRESSION: Bibasilar atelectasis/scarring. Pneumonia is not excluded. Electronically Signed   By: Vanetta Chou M.D.   On: 05/29/2024 13:48       The results of significant diagnostics from this hospitalization (including imaging, microbiology, ancillary and laboratory) are listed below for reference.     Microbiology: Recent Results (from the past 240 hours)  Resp panel by RT-PCR (RSV, Flu A&B, Covid) Anterior Nasal Swab     Status: Abnormal   Collection Time: 05/29/24 11:28 AM   Specimen: Anterior Nasal  Swab  Result Value Ref Range Status   SARS Coronavirus 2 by RT PCR NEGATIVE NEGATIVE Final    Comment: (NOTE) SARS-CoV-2 target nucleic acids are NOT DETECTED.  The SARS-CoV-2 RNA is generally detectable in upper respiratory specimens during the acute phase of infection. The lowest concentration of SARS-CoV-2 viral copies this assay can detect is 138 copies/mL. A negative result does not preclude SARS-Cov-2 infection and should not be used as the sole basis for treatment or other patient management decisions. A negative result may occur with  improper specimen collection/handling, submission of specimen other than nasopharyngeal swab, presence of viral mutation(s) within the areas targeted by this assay, and inadequate number of viral copies(<138 copies/mL). A negative result must be combined with clinical observations, patient history, and epidemiological information. The expected result is Negative.  Fact Sheet for Patients:  bloggercourse.com  Fact Sheet for Healthcare Providers:  seriousbroker.it  This test is no t yet approved or cleared by the [place]  FDA and  has been authorized for detection and/or diagnosis of SARS-CoV-2 by FDA under an Emergency Use Authorization (EUA). This EUA will remain  in effect (meaning this test can be used) for the duration of the COVID-19 declaration under Section 564(b)(1) of the Act, 21 U.S.C.section 360bbb-3(b)(1), unless the authorization is terminated  or revoked sooner.       Influenza A by PCR POSITIVE (A) NEGATIVE Final   Influenza B by PCR NEGATIVE NEGATIVE Final    Comment: (NOTE) The Xpert Xpress SARS-CoV-2/FLU/RSV plus assay is intended as an aid in the diagnosis of influenza from Nasopharyngeal swab specimens and should not be used as a sole basis for treatment. Nasal washings and aspirates are unacceptable for Xpert Xpress SARS-CoV-2/FLU/RSV testing.  Fact Sheet for  Patients: bloggercourse.com  Fact Sheet for Healthcare Providers: seriousbroker.it  This test is not yet approved or cleared by the [place]  FDA and has been authorized for detection and/or diagnosis of SARS-CoV-2 by FDA under an Emergency Use Authorization (EUA). This EUA will remain in effect (meaning this test can be used) for the duration of the COVID-19 declaration under Section 564(b)(1) of the Act, 21 U.S.C. section 360bbb-3(b)(1), unless the authorization is terminated or revoked.     Resp Syncytial Virus by PCR NEGATIVE NEGATIVE Final    Comment: (NOTE) Fact Sheet for Patients: bloggercourse.com  Fact Sheet for Healthcare Providers:  seriousbroker.it  This test is not yet approved or cleared by the [place]  FDA and has been authorized for detection and/or diagnosis of SARS-CoV-2 by FDA under an Emergency Use Authorization (EUA). This EUA will remain in effect (meaning this test can be used) for the duration of the COVID-19 declaration under Section 564(b)(1) of the Act, 21 U.S.C. section 360bbb-3(b)(1), unless the authorization is terminated or revoked.  Performed at [facility], [address] [place], [place] [address]      Labs:  CBC: Recent Labs  Lab 05/29/24 1129 05/30/24 0723 05/31/24 0403 06/02/24 0334  WBC 7.0 4.9 4.3 6.1  HGB 12.6 11.9* 11.7* 11.7*  HCT 39.5 37.2 36.4 36.5  MCV 85.9 85.9 86.5 85.3  PLT 161 150 143* 159   BMP &GFR Recent Labs  Lab 05/29/24 1129 05/30/24 0723 05/30/24 1121 05/31/24 0403 06/01/24 0349 06/02/24 0334  NA 140 139  --   --   --  140  K 4.5 4.2  --   --   --  4.4  CL 104 104  --   --   --  104  CO2 26 25  --   --   --  25  GLUCOSE 120* 91  --   --   --  184*  BUN 12 9  --   --   --  16  CREATININE 0.71 0.56  --   --   --  0.60  CALCIUM  10.0 9.2  --   --   --  9.8  MG  --   --   2.1 2.1 2.4 2.3  PHOS  --   --   --   --   --  2.7   Estimated Creatinine Clearance: 57.2 mL/min (by C-G formula based on SCr of 0.6 mg/dL). Liver & Pancreas: Recent Labs  Lab 06/02/24 0334  ALBUMIN  3.7   No results for input(s): LIPASE, AMYLASE in the last 168 hours. No results for input(s): AMMONIA in the last 168 hours. Diabetic: No results for input(s): HGBA1C in the last 72 hours. No results for input(s): GLUCAP in the last 168 hours. Cardiac Enzymes: No results for input(s): CKTOTAL, CKMB, CKMBINDEX, TROPONINI in the last 168 hours. No results for input(s): PROBNP in the last 8760 hours. Coagulation Profile: No results for input(s): INR, PROTIME in the last 168 hours. Thyroid  Function Tests: No results for input(s): TSH, T4TOTAL, FREET4, T3FREE, THYROIDAB in the last 72 hours. Lipid Profile: No results for input(s): CHOL, HDL, LDLCALC, TRIG, CHOLHDL, LDLDIRECT in the last 72 hours. Anemia Panel: No results for input(s): VITAMINB12, FOLATE, FERRITIN, TIBC, IRON , RETICCTPCT in the last 72 hours. Urine analysis:    Component Value Date/Time   COLORURINE YELLOW 01/19/2024 1536   APPEARANCEUR CLOUDY (A) 01/19/2024 1536   APPEARANCEUR Clear 09/23/2023 1118   LABSPEC 1.013 01/19/2024 1536   LABSPEC 1.021 12/25/2012 1653   PHURINE 7.0 01/19/2024 1536   GLUCOSEU NEGATIVE 01/19/2024 1536   GLUCOSEU NEGATIVE 08/10/2023 1012   HGBUR MODERATE (A) 01/19/2024 1536   BILIRUBINUR NEGATIVE 01/19/2024 1536   BILIRUBINUR negative 12/06/2023 1719   BILIRUBINUR Negative 09/23/2023 1118   BILIRUBINUR Negative 12/25/2012 1653   KETONESUR NEGATIVE 01/19/2024 1536   PROTEINUR 30 (A) 01/19/2024 1536   UROBILINOGEN 0.2 12/06/2023 1719   UROBILINOGEN 0.2 08/10/2023 1012   NITRITE NEGATIVE 01/19/2024 1536   LEUKOCYTESUR LARGE (A) 01/19/2024 1536   LEUKOCYTESUR Negative 12/25/2012 1653   Sepsis Labs: Invalid input(s):  PROCALCITONIN, LACTICIDVEN   SIGNED:  Mignon ONEIDA Bump, MD  [facility] 06/02/2024, 1:32 PM   "

## 2024-06-02 NOTE — Progress Notes (Signed)
 SATURATION QUALIFICATIONS: (This note is used to comply with regulatory documentation for home oxygen )  Patient Saturations on Room Air at Rest = 89%  Patient Saturations on Room Air while Ambulating = 87%  Patient Saturations on 4 Liters of oxygen  while Ambulating = 96%  Please briefly explain why patient needs home oxygen : COPD

## 2024-06-02 NOTE — Plan of Care (Signed)

## 2024-06-05 ENCOUNTER — Telehealth: Payer: Self-pay

## 2024-06-05 ENCOUNTER — Ambulatory Visit: Attending: Cardiology

## 2024-06-05 ENCOUNTER — Telehealth: Payer: Self-pay | Admitting: *Deleted

## 2024-06-05 ENCOUNTER — Other Ambulatory Visit (HOSPITAL_COMMUNITY): Payer: Self-pay | Admitting: General Surgery

## 2024-06-05 DIAGNOSIS — Z950 Presence of cardiac pacemaker: Secondary | ICD-10-CM

## 2024-06-05 DIAGNOSIS — I5022 Chronic systolic (congestive) heart failure: Secondary | ICD-10-CM

## 2024-06-05 DIAGNOSIS — N824 Other female intestinal-genital tract fistulae: Secondary | ICD-10-CM

## 2024-06-05 NOTE — Telephone Encounter (Signed)
 Is this okay?

## 2024-06-05 NOTE — Patient Instructions (Signed)
 Visit Information  Thank you for taking time to visit with me today. Please don't hesitate to contact me if I can be of assistance to you before our next scheduled telephone appointment.  Our next appointment is by telephone on Wednesday, June 14, 2024 at 10:00 am  Please call the care guide team at [phone] if you need to cancel or reschedule your appointment.   Patient Self Care Activities:  Attend all scheduled provider appointments Call provider office for new concerns or questions  Participate in Transition of Care Program/Attend TOC scheduled calls Take medications as prescribed   follow rescue plan if symptoms flare-up keep follow-up appointments: please make an appointment with Dr. Joshua for hospital follow up office visit; you are scheduled to see Dr. Cherrie on Tuesday June 13, 2024 at 1:30 pm Continue pacing activity to avoid episodes of shortness of breath Continue using home oxygen  as prescribed Use assistive devices as needed to prevent falls Continue to follow your established action plan for episodes of shortness of breath- use your home oxygen  and your nebulizer; rest throughout the day Continue working with the home health team that is involved in your care: [facility] [phone] If you believe your condition is getting worse- contact your care providers (doctors) promptly- reaching out to your doctor early when you have concerns can prevent you from having to go to the hospital  Following is a copy of your care plan:   Goals Addressed             This Visit's Progress    [facility] Transitions of Care (TOC) Care Plan       Problems:  Recent Hospitalization for treatment of COPD Independent: resides at [facility]; uses cane/ walker at baseline; no falls x > 12 months; son assists with transportation; also has transportation services at [facility]; home O2 at 4 L/min as per baseline [facility]/ PT active [phone] Recent hospitalization December 22-26, 2025 for COPD  exacerbation secondary to influenza A  Goal:  Over the next 30 days, the patient will not experience hospital readmission  Interventions:  Transitions of Care: Week # 1/ Day # 1 Durable Medical Equipment (DME) needs assessed with patient/caregiver Doctor Visits  - discussed the importance of doctor visits Communication with PCP re: successful enrollment into [facility] 30-day program Post discharge activity limitations prescribed by provider reviewed Reviewed Signs and symptoms of infection Reviewed upcoming provider office visits: CHF provider 06/13/24: confirmed patient is aware of all and has plans to attend as scheduled Provided my direct contact information should questions/ concerns/ needs arise post-TOC initial call, prior to next TOC 30-day program RN CM telephone visit    COPD Interventions: Advised patient to track and manage COPD triggers Advised patient to self assesses COPD action plan zone and make appointment with provider if in the yellow zone for 48 hours without improvement Assessed social determinant of health barriers Discussed the importance of adequate rest and management of fatigue with COPD Provided education about and advised patient to utilize infection prevention strategies to reduce risk of respiratory infection Provided instruction about proper use of medications used for management of COPD including inhalers Screening for signs and symptoms of depression related to chronic disease state  Use of home oxygen - confirmed well established with baseline home O2 for many years Reinforced/ provided education around safe use/ maintenance of home O2 Confirmed/ reinforced action plan for shortness of breath: verbalizes excellent ongoing understanding/ adherence to same  Patient Self Care Activities:  Attend all scheduled  provider appointments Call provider office for new concerns or questions  Participate in Transition of Care Program/Attend [facility] scheduled calls Take  medications as prescribed   follow rescue plan if symptoms flare-up keep follow-up appointments: please make an appointment with Dr. Joshua for hospital follow up office visit; you are scheduled to see Dr. Cherrie on Tuesday June 13, 2024 at 1:30 pm Continue pacing activity to avoid episodes of shortness of breath Continue using home oxygen  as prescribed Use assistive devices as needed to prevent falls Continue to follow your established action plan for episodes of shortness of breath- use your home oxygen  and your nebulizer; rest throughout the day Continue working with the home health team that is involved in your care: [facility] [phone] If you believe your condition is getting worse- contact your care providers (doctors) promptly- reaching out to your doctor early when you have concerns can prevent you from having to go to the hospital  Plan:  Telephone follow up appointment with care management team member scheduled for:  06/14/24       Care plan and visit instructions communicated with the patient verbally today. Patient agrees to receive a copy in MyChart. Active MyChart status and patient understanding of how to access instructions and care plan via MyChart confirmed with patient.     If you are experiencing a Mental Health or Behavioral Health Crisis or need someone to talk to, please  call the Suicide and Crisis Lifeline: 988 call the [place]  National Suicide Prevention Lifeline: 317-818-5475 or TTY: 579-337-8567 TTY (313) 355-4358) to talk to a trained counselor call 1-800-273-TALK (toll free, 24 hour hotline) go to [facility] [address], [place] [phone]) call the [facility] Crisis Line: [phone] call 911   Beatris Blinda Lawrence, RN, BSN, CCRN Alumnus RN Care Manager  Transitions of Care  [facility] - Population Health  [place] [phone]: direct office

## 2024-06-05 NOTE — Telephone Encounter (Signed)
 yes

## 2024-06-05 NOTE — Transitions of Care (Post Inpatient/ED Visit) (Signed)
 "  06/05/2024  Name: Kathleen [name] MRN: [id number] DOB: 01-Oct-1945  Today's TOC FU Call Status: Today's TOC FU Call Status:: Successful TOC FU Call Completed TOC FU Call Complete Date: 06/05/24  Patient's Name and Date of Birth confirmed. Name, DOB  Transition Care Management Follow-up Telephone Call Date of Discharge: 06/02/24 Discharge Facility: [facility]) Type of Discharge: Inpatient Admission Primary Inpatient Discharge Diagnosis:: COPD exacerbation secondary to Influenza A How have you been since you were released from the hospital?: Same (I really don't feel a whole lot better- just really weak; still have a cough; I don't want to schedule with Dr. Joshua right now- I will see how I do as far as feeling better/ stronger and call him myself to schedule whenever I get around to it) Any questions or concerns?: No  Items Reviewed: Did you receive and understand the discharge instructions provided?: Yes (reviewed with patient who verbalizes good understanding of same) Medications obtained,verified, and reconciled?: Yes (Medications Reviewed) (Full medication reconciliation/ review completed; no concerns or discrepancies identified; confirmed patient obtained/ is taking all newly Rx'd medications as instructed; self-manages medications and denies questions/ concerns around medications today) Any new allergies since your discharge?: No Dietary orders reviewed?: Yes Type of Diet Ordered:: Healthy as I can Do you have support at home?: Yes People in Home [RPT]: alone Name of Support/Comfort Primary Source: Reports independent in self-care activities; resides alone in ILF ([facility])- local son assists as/ if needed/ indicated  Medications Reviewed Today: Medications Reviewed Today     Reviewed by Brentin Shin M, RN (Registered Nurse) on 06/05/24 at 1018  Med List Status: <None>   Medication Order Taking? Sig Documenting Provider Last Dose Status Informant  azelastine   (ASTELIN ) 0.1 % nasal spray [id number] Yes Place 2 sprays into both nostrils 2 (two) times daily. Jude Harden GAILS, MD  Active Self, Pharmacy Records  BREO ELLIPTA  100-25 MCG/ACT AEPB [id number] Yes inhale ONE PUFF into THE lungs daily. Jude Harden GAILS, MD  Active Self, Pharmacy Records  Cholecalciferol  (VITAMIN D3) 50 MCG (2000 UT) TABS [id number] Yes Take 2,000 Units by mouth at bedtime. [provider]  Active Self, Pharmacy Records  cyanocobalamin  (VITAMIN B12) 1000 MCG tablet [id number] Yes Take 1,000 mcg by mouth daily. [provider]  Active Self, Pharmacy Records  docusate sodium  (COLACE) 100 MG capsule [id number] Yes Take 100 mg by mouth 2 (two) times daily. [provider]  Active Self, Pharmacy Records  dofetilide  (TIKOSYN ) 250 MCG capsule [id number] Yes TAKE ONE CAPSULE BY MOUTH EVERY MORNING AND AT BEDTIME. Rolan Ezra RAMAN, MD  Active Self, Pharmacy Records  ELIQUIS  5 MG TABS tablet [id number] Yes Take 1 tablet (5 mg total) by mouth 2 (two) times daily. Bensimhon, Toribio SAUNDERS, MD  Active Self, Pharmacy Records  febuxostat  (ULORIC ) 40 MG tablet [id number] Yes Take 1 tablet (40 mg total) by mouth daily. Jeannetta Lonni ORN, MD  Active Self, Pharmacy Records  ferrous gluconate  [facility]) 324 MG tablet [id number]  Take 1 tablet (324 mg total) by mouth daily with breakfast. Mansouraty, Aloha Raddle., MD  Active Self, Pharmacy Records  gabapentin  (NEURONTIN ) 300 MG capsule [id number] Yes Take 1 capsule (300 mg total) by mouth at bedtime. Gregg Lek, MD  Active Self, Pharmacy Records  ketoconazole  (NIZORAL ) 2 % cream [id number] Yes Apply 1 Application topically 2 (two) times daily. Joshua Debby CROME, MD  Active Self, Pharmacy Records  Magnesium  500 MG TABS [id number] Yes Take 500 mg by mouth every evening. [provider]  Active Self, Pharmacy Records  mupirocin  ointment (BACTROBAN ) 2 % [id number]  Apply 1 Application topically 2 (two) times daily.  Patient not taking: Reported  on 06/05/2024   Joshua Debby CROME, MD  Active Self, Pharmacy Records  naloxone  (NARCAN ) nasal spray 4 mg/0.1 mL [id number] Yes Place 1 spray into the nose once as needed (opioid overdose). [provider]  Active Self, Pharmacy Records           Med Note CARLEEN, [place] D   Tue May 30, 2024  9:34 AM) Has at home - hasn't had to use.  oxyCODONE -acetaminophen  (PERCOCET) 10-325 MG tablet [id number] Yes Take 0.5-1 tablets by mouth See admin instructions. *1-3 times daily* [provider]  Active Self, Pharmacy Records  OXYGEN  [id number] Yes Inhale 4 L into the lungs continuous. Use with resmed ventilator [provider]  Active Self, Pharmacy Records  potassium chloride  (KLOR-CON ) 10 MEQ tablet [id number] Yes Take 4 tablets (40 mEq total) by mouth daily. Linden, Harlene HERO, FNP  Active Self, Pharmacy Records  predniSONE  (DELTASONE ) 50 MG tablet [id number] Yes Take 1 tablet (50 mg total) by mouth daily with breakfast. Gonfa, Taye T, MD  Active   revefenacin  (YUPELRI ) 175 MCG/3ML nebulizer solution [id number] Yes Take 175 mcg by nebulization daily. [provider]  Active Self, Pharmacy Records           Med Note CARLEEN, [facility] D   Tue May 30, 2024  9:34 AM)    spironolactone  (ALDACTONE ) 25 MG tablet [id number] Yes Take 1 tablet (25 mg total) by mouth every evening. Lee, Jordan, NP  Active Self, Pharmacy Records  torsemide  (DEMADEX ) 20 MG tablet [id number] Yes Take 2 tablets (40 mg total) by mouth daily. Lee, Jordan, NP  Active Self, Pharmacy Records  triamcinolone  cream (KENALOG ) 0.1 % [id number] Yes Apply 1 Application topically 2 (two) times daily. Cyndi Shaver, PA-C  Active Self, Pharmacy Records  Med List Note Leobardo Garre, CPhT 04/22/23 2309): [facility]- use mobile number to contact           Home Care and Equipment/Supplies: Were Home Health Services Ordered?: Yes Name of Home Health Agency:: [facility] [phone] Has Agency set  up a time to come to your home?: Yes First Home Health Visit Date: 06/05/24 (They left me a message saying they are supposed to be calling me today to come out; confirmed- re-provided contact information for [facility] agency) Any new equipment or medical supplies ordered?: No  Functional Questionnaire: Do you need assistance with bathing/showering or dressing?: No Do you need assistance with meal preparation?: No Do you need assistance with eating?: No Do you have difficulty maintaining continence: No Do you need assistance with getting out of bed/getting out of a chair/moving?: No Do you have difficulty managing or taking your medications?: No  Follow up appointments reviewed: PCP Follow-up appointment confirmed?: No (Patient declined scheduling assistance for hospital follow up with PCP- states she prefers to call herself, whenever I get around to it) MD Provider Line Number:[phone] Given: No Specialist Hospital Follow-up appointment confirmed?: Yes Date of Specialist follow-up appointment?: 06/14/23 Follow-Up Specialty Provider:: CHF provider: Dr. Bensimhon Do you need transportation to your follow-up appointment?: No Do you understand care options if your condition(s) worsen?: Yes-patient verbalized understanding  SDOH Interventions Today    Flowsheet Row Most Recent Value  SDOH Interventions   Food Insecurity Interventions Intervention Not Indicated  Housing Interventions Intervention Not Indicated  [resides at ILF: [facility] x last 4 years]  Transportation  Interventions Intervention Not Indicated  [reports my son usually takes me where I go but I also have the ILF that can help if I need them to]  Utilities Interventions Intervention Not Indicated   See TOC assessment tabs/ care plan for additional assessment/ TOC intervention information  I appreciate the opportunity to participate in Andreia's care,  Pls call/ message for questions,  Fatin Bachicha Mckinney Jerremy Maione, RN, BSN,  CCRN Alumnus RN Care Manager  Transitions of Care  [facility] - [name] [phone]: direct office  "

## 2024-06-05 NOTE — Telephone Encounter (Signed)
 Verbals were given

## 2024-06-05 NOTE — Telephone Encounter (Signed)
 Copied from CRM #[id number]. Topic: General - Other >> Jun 05, 2024 11:14 AM Antony RAMAN wrote: Reason for CRM: holly from [facility] calling to see if they can start home health today instead of [phone]

## 2024-06-06 NOTE — Progress Notes (Signed)
 EPIC Encounter for ICM Monitoring  Patient Name: Kathleen [name] is a 78 y.o. female Date: 06/06/2024 Primary Care Physican: Joshua Debby CROME, MD Primary Cardiologist: Bensimhon Electrophysiologist: Camnitz Bi-V Pacing: 100%          04/06/2023 Home Weight: 176 lbs    06/30/2023 Home Weight: 178 lbs 08/02/2023 Home Weight: 172 lbs 09/15/2023   Home Weight: 177 lbs 11/11/2023   Home Weight: 176 lbs 11/17/2023 Home Weight: 182 lbs 11/22/2023 Home Weight: 185 lbs 01/05/2024 Home Weight: 190 lbs  01/17/2024 Home Weight: 183 lbs 02/10/2024 Home Weight: 183 lbs 03/22/2024 Home Weight: 183-184 lbs 06/06/2024 Weight: 180 lbs   Since 24-Apr-2024 Time in AT/AF  0.0 hr/day (0.0%)                                 Spoke with patient and heart failure questions reviewed.  Transmission results reviewed.  Pt was in hospital with flu and still feeling weak.  She was hospitalized between 05/29/2024-06/02/2025 d/t flu.  Weight during hospitalization 192 lbs.    Diet:  Lives in an assisted living and meals are provided which are not low salt.     Since 04/24/2024 ICM Remote Transmission: Optivol thoracic impedance suggesting possible fluid accumulation starting 05/25/2024 and return to baseline 06/04/2024.    Prescribed:  Torsemide  20 mg take 2 tablet(s) (40 mg total) by mouth daily.   Potassium 10 mEq take 4 tablet(s) (40 mEq total) by mouth daily  Spironolactone  25 mg take 1 (25 mg total) tablet by mouth every evening   Labs: 06/02/2024 Creatinine 0.60, BUN 16, Potassium 4.4, Sodium 140, GFR >60  05/30/2024 Creatinine 0.56, BUN 9,   Potassium 4.2, Sodium 139, GFR >60  05/29/2024 Creatinine 0.71, BUN 12, Potassium 4.5, Sodium 140, GFR >60  03/24/2024 Creatinine 0.69, BUN 17, Potassium 4.1, Sodium 141 02/08/2024 Creatinine 0.73, BUN 15, Potassium 4.2, Sodium 141, GFR 85  02/01/2024 Creatinine 0.78, BUN 25, Potassium 3.7, Sodium 140  01/19/2024 Creatinine 0.73, BUN 19, Potassium 3.5, Sodium 136  01/11/2024  Creatinine 0.66, BUN 17, Potassium 3.7, Sodium 139, GFR >60  01/10/2024 Creatinine 0.73, BUN 20, Potassium 4.3, Sodium 140, GFR >60  A complete set of results can be found in Results Review.   Recommendations:  No changes and encouraged to call if experiencing any fluid symptoms.   Follow-up plan: ICM clinic phone appointment on 07/10/2024.  91 day device clinic remote transmission 06/28/2024.     EP/Cardiology Office Visits:  Recall 04/19/2024 with Dr Inocencio.  06/13/2024 with Dr Cherrie.   Copy of ICM check sent to Dr. Inocencio.   Remote monitoring is medically necessary for Heart Failure Management.    Daily Thoracic Impedance ICM trend: 03/06/2024 through 06/05/2024.    12-14 Month Thoracic Impedance ICM trend:     Kathleen GORMAN Garner, RN 06/06/2024 12:41 PM

## 2024-06-07 ENCOUNTER — Inpatient Hospital Stay (HOSPITAL_BASED_OUTPATIENT_CLINIC_OR_DEPARTMENT_OTHER)
Admission: EM | Admit: 2024-06-07 | Discharge: 2024-06-13 | DRG: 194 | Disposition: A | Discharge status: Skilled Nursing Facility | Attending: Internal Medicine | Admitting: Internal Medicine

## 2024-06-07 ENCOUNTER — Other Ambulatory Visit: Payer: Self-pay

## 2024-06-07 ENCOUNTER — Encounter (HOSPITAL_BASED_OUTPATIENT_CLINIC_OR_DEPARTMENT_OTHER): Payer: Self-pay

## 2024-06-07 ENCOUNTER — Emergency Department (HOSPITAL_BASED_OUTPATIENT_CLINIC_OR_DEPARTMENT_OTHER)

## 2024-06-07 DIAGNOSIS — Z803 Family history of malignant neoplasm of breast: Secondary | ICD-10-CM

## 2024-06-07 DIAGNOSIS — J441 Chronic obstructive pulmonary disease with (acute) exacerbation: Secondary | ICD-10-CM | POA: Diagnosis present

## 2024-06-07 DIAGNOSIS — R0789 Other chest pain: Secondary | ICD-10-CM

## 2024-06-07 DIAGNOSIS — R059 Cough, unspecified: Secondary | ICD-10-CM | POA: Diagnosis present

## 2024-06-07 DIAGNOSIS — I130 Hypertensive heart and chronic kidney disease with heart failure and stage 1 through stage 4 chronic kidney disease, or unspecified chronic kidney disease: Secondary | ICD-10-CM | POA: Diagnosis present

## 2024-06-07 DIAGNOSIS — E440 Moderate protein-calorie malnutrition: Secondary | ICD-10-CM | POA: Diagnosis present

## 2024-06-07 DIAGNOSIS — Z91048 Other nonmedicinal substance allergy status: Secondary | ICD-10-CM

## 2024-06-07 DIAGNOSIS — I4811 Longstanding persistent atrial fibrillation: Secondary | ICD-10-CM | POA: Diagnosis present

## 2024-06-07 DIAGNOSIS — Z79899 Other long term (current) drug therapy: Secondary | ICD-10-CM

## 2024-06-07 DIAGNOSIS — Z9981 Dependence on supplemental oxygen: Secondary | ICD-10-CM

## 2024-06-07 DIAGNOSIS — Z887 Allergy status to serum and vaccine status: Secondary | ICD-10-CM

## 2024-06-07 DIAGNOSIS — J440 Chronic obstructive pulmonary disease with acute lower respiratory infection: Secondary | ICD-10-CM | POA: Diagnosis present

## 2024-06-07 DIAGNOSIS — N189 Chronic kidney disease, unspecified: Secondary | ICD-10-CM | POA: Diagnosis present

## 2024-06-07 DIAGNOSIS — Z6836 Body mass index (BMI) 36.0-36.9, adult: Secondary | ICD-10-CM

## 2024-06-07 DIAGNOSIS — Z950 Presence of cardiac pacemaker: Secondary | ICD-10-CM

## 2024-06-07 DIAGNOSIS — Z800 Family history of malignant neoplasm of digestive organs: Secondary | ICD-10-CM

## 2024-06-07 DIAGNOSIS — I4891 Unspecified atrial fibrillation: Secondary | ICD-10-CM | POA: Diagnosis present

## 2024-06-07 DIAGNOSIS — Z7901 Long term (current) use of anticoagulants: Secondary | ICD-10-CM

## 2024-06-07 DIAGNOSIS — E66812 Obesity, class 2: Secondary | ICD-10-CM | POA: Diagnosis present

## 2024-06-07 DIAGNOSIS — M7918 Myalgia, other site: Secondary | ICD-10-CM | POA: Diagnosis present

## 2024-06-07 DIAGNOSIS — Z933 Colostomy status: Secondary | ICD-10-CM

## 2024-06-07 DIAGNOSIS — J439 Emphysema, unspecified: Secondary | ICD-10-CM | POA: Diagnosis present

## 2024-06-07 DIAGNOSIS — Z888 Allergy status to other drugs, medicaments and biological substances status: Secondary | ICD-10-CM

## 2024-06-07 DIAGNOSIS — Z825 Family history of asthma and other chronic lower respiratory diseases: Secondary | ICD-10-CM

## 2024-06-07 DIAGNOSIS — G894 Chronic pain syndrome: Secondary | ICD-10-CM | POA: Diagnosis present

## 2024-06-07 DIAGNOSIS — Z881 Allergy status to other antibiotic agents status: Secondary | ICD-10-CM

## 2024-06-07 DIAGNOSIS — R5381 Other malaise: Secondary | ICD-10-CM | POA: Diagnosis present

## 2024-06-07 DIAGNOSIS — J189 Pneumonia, unspecified organism: Principal | ICD-10-CM | POA: Diagnosis present

## 2024-06-07 DIAGNOSIS — R54 Age-related physical debility: Secondary | ICD-10-CM | POA: Diagnosis present

## 2024-06-07 DIAGNOSIS — J449 Chronic obstructive pulmonary disease, unspecified: Secondary | ICD-10-CM | POA: Diagnosis not present

## 2024-06-07 DIAGNOSIS — J9611 Chronic respiratory failure with hypoxia: Secondary | ICD-10-CM | POA: Diagnosis not present

## 2024-06-07 DIAGNOSIS — Z90710 Acquired absence of both cervix and uterus: Secondary | ICD-10-CM

## 2024-06-07 DIAGNOSIS — Z8619 Personal history of other infectious and parasitic diseases: Secondary | ICD-10-CM

## 2024-06-07 DIAGNOSIS — I428 Other cardiomyopathies: Secondary | ICD-10-CM | POA: Diagnosis present

## 2024-06-07 DIAGNOSIS — Z87891 Personal history of nicotine dependence: Secondary | ICD-10-CM

## 2024-06-07 DIAGNOSIS — I5032 Chronic diastolic (congestive) heart failure: Secondary | ICD-10-CM | POA: Diagnosis present

## 2024-06-07 DIAGNOSIS — Z79891 Long term (current) use of opiate analgesic: Secondary | ICD-10-CM

## 2024-06-07 DIAGNOSIS — I10 Essential (primary) hypertension: Secondary | ICD-10-CM | POA: Diagnosis present

## 2024-06-07 LAB — CBC WITH DIFFERENTIAL/PLATELET
Abs Immature Granulocytes: 0.28 K/uL — ABNORMAL HIGH (ref 0.00–0.07)
Basophils Absolute: 0.1 K/uL (ref 0.0–0.1)
Basophils Relative: 1 %
Eosinophils Absolute: 0.1 K/uL (ref 0.0–0.5)
Eosinophils Relative: 1 %
HCT: 44.7 % (ref 36.0–46.0)
Hemoglobin: 14.8 g/dL (ref 12.0–15.0)
Immature Granulocytes: 2 %
Lymphocytes Relative: 9 %
Lymphs Abs: 1.1 K/uL (ref 0.7–4.0)
MCH: 27.5 pg (ref 26.0–34.0)
MCHC: 33.1 g/dL (ref 30.0–36.0)
MCV: 83.1 fL (ref 80.0–100.0)
Monocytes Absolute: 0.8 K/uL (ref 0.1–1.0)
Monocytes Relative: 6 %
Neutro Abs: 10.5 K/uL — ABNORMAL HIGH (ref 1.7–7.7)
Neutrophils Relative %: 81 %
Platelets: 253 K/uL (ref 150–400)
RBC: 5.38 MIL/uL — ABNORMAL HIGH (ref 3.87–5.11)
RDW: 14.2 % (ref 11.5–15.5)
WBC: 12.8 K/uL — ABNORMAL HIGH (ref 4.0–10.5)
nRBC: 0 % (ref 0.0–0.2)

## 2024-06-07 LAB — COMPREHENSIVE METABOLIC PANEL WITH GFR
ALT: 14 U/L (ref 0–44)
AST: 16 U/L (ref 15–41)
Albumin: 4.6 g/dL (ref 3.5–5.0)
Alkaline Phosphatase: 54 U/L (ref 38–126)
Anion gap: 17 — ABNORMAL HIGH (ref 5–15)
BUN: 21 mg/dL (ref 8–23)
CO2: 26 mmol/L (ref 22–32)
Calcium: 9.7 mg/dL (ref 8.9–10.3)
Chloride: 97 mmol/L — ABNORMAL LOW (ref 98–111)
Creatinine, Ser: 0.85 mg/dL (ref 0.44–1.00)
GFR, Estimated: >60 mL/min
Glucose, Bld: 140 mg/dL — ABNORMAL HIGH (ref 70–99)
Potassium: 3.7 mmol/L (ref 3.5–5.1)
Sodium: 140 mmol/L (ref 135–145)
Total Bilirubin: 0.7 mg/dL (ref 0.0–1.2)
Total Protein: 7.2 g/dL (ref 6.5–8.1)

## 2024-06-07 LAB — PROTIME-INR
INR: 1 (ref 0.8–1.2)
Prothrombin Time: 13.3 s (ref 11.4–15.2)

## 2024-06-07 LAB — LACTIC ACID, PLASMA
Lactic Acid, Venous: 2.2 mmol/L (ref 0.5–1.9)
Lactic Acid, Venous: 1.4 mmol/L (ref 0.5–1.9)

## 2024-06-07 MED ORDER — IPRATROPIUM-ALBUTEROL 0.5-2.5 (3) MG/3ML IN SOLN
3.0000 mL | Freq: Three times a day (TID) | RESPIRATORY_TRACT | Status: DC
  Administered 2024-06-08 – 2024-06-11 (×10): 3 mL via RESPIRATORY_TRACT
  Filled 2024-06-07 (×10): qty 3

## 2024-06-07 MED ORDER — IPRATROPIUM-ALBUTEROL 0.5-2.5 (3) MG/3ML IN SOLN
3.0000 mL | Freq: Once | RESPIRATORY_TRACT | Status: AC
  Administered 2024-06-07: 3 mL via RESPIRATORY_TRACT
  Filled 2024-06-07: qty 3

## 2024-06-07 MED ORDER — DOFETILIDE 250 MCG PO CAPS
250.0000 ug | ORAL_CAPSULE | Freq: Two times a day (BID) | ORAL | Status: DC
  Administered 2024-06-07 – 2024-06-13 (×12): 250 ug via ORAL
  Filled 2024-06-07 (×12): qty 1

## 2024-06-07 MED ORDER — GUAIFENESIN 100 MG/5ML PO LIQD
200.0000 mg | Freq: Three times a day (TID) | ORAL | Status: AC | PRN
  Administered 2024-06-08 – 2024-06-11 (×4): 200 mg via ORAL
  Filled 2024-06-07 (×5): qty 10

## 2024-06-07 MED ORDER — SPIRONOLACTONE 25 MG PO TABS
25.0000 mg | ORAL_TABLET | Freq: Every evening | ORAL | Status: DC
  Administered 2024-06-07 – 2024-06-12 (×6): 25 mg via ORAL
  Filled 2024-06-07: qty 1
  Filled 2024-06-07: qty 2
  Filled 2024-06-07 (×4): qty 1

## 2024-06-07 MED ORDER — REVEFENACIN 175 MCG/3ML IN SOLN: 175.0000 ug | Freq: Every day | RESPIRATORY_TRACT | Status: DC

## 2024-06-07 MED ORDER — HYDROCOD POLI-CHLORPHE POLI ER 10-8 MG/5ML PO SUER
5.0000 mL | Freq: Every evening | ORAL | Status: DC | PRN
  Administered 2024-06-07 – 2024-06-09 (×3): 5 mL via ORAL
  Filled 2024-06-07 (×3): qty 5

## 2024-06-07 MED ORDER — LIDOCAINE 5 % EX PTCH
1.0000 | MEDICATED_PATCH | CUTANEOUS | Status: AC
  Administered 2024-06-08: 1 via TRANSDERMAL
  Filled 2024-06-07: qty 1

## 2024-06-07 MED ORDER — METHYLPREDNISOLONE SODIUM SUCC 40 MG IJ SOLR
40.0000 mg | Freq: Every day | INTRAMUSCULAR | Status: AC
  Administered 2024-06-08: 40 mg via INTRAVENOUS
  Filled 2024-06-07: qty 1

## 2024-06-07 MED ORDER — OXYCODONE HCL 5 MG PO TABS: 5.0000 mg | ORAL_TABLET | Freq: Three times a day (TID) | ORAL | Status: DC | PRN

## 2024-06-07 MED ORDER — ONDANSETRON HCL 4 MG/2ML IJ SOLN
4.0000 mg | Freq: Once | INTRAMUSCULAR | Status: AC
  Administered 2024-06-07: 4 mg via INTRAVENOUS
  Filled 2024-06-07: qty 2

## 2024-06-07 MED ORDER — METHYLPREDNISOLONE SODIUM SUCC 125 MG IJ SOLR
125.0000 mg | Freq: Once | INTRAMUSCULAR | Status: AC
  Administered 2024-06-07: 125 mg via INTRAVENOUS
  Filled 2024-06-07: qty 2

## 2024-06-07 MED ORDER — OXYCODONE-ACETAMINOPHEN 10-325 MG PO TABS: 0.5000 | ORAL_TABLET | ORAL | Status: DC

## 2024-06-07 MED ORDER — BENZONATATE 100 MG PO CAPS
100.0000 mg | ORAL_CAPSULE | Freq: Once | ORAL | Status: AC
  Administered 2024-06-07: 100 mg via ORAL
  Filled 2024-06-07: qty 1

## 2024-06-07 MED ORDER — MAGNESIUM 500 MG PO TABS: 500.0000 mg | ORAL_TABLET | Freq: Every evening | ORAL | Status: DC

## 2024-06-07 MED ORDER — TRIMETHOBENZAMIDE HCL 100 MG/ML IM SOLN: 200.0000 mg | Freq: Four times a day (QID) | INTRAMUSCULAR | Status: AC | PRN

## 2024-06-07 MED ORDER — APIXABAN 5 MG PO TABS
5.0000 mg | ORAL_TABLET | Freq: Two times a day (BID) | ORAL | Status: DC
  Administered 2024-06-07 – 2024-06-13 (×13): 5 mg via ORAL
  Filled 2024-06-07 (×13): qty 1

## 2024-06-07 MED ORDER — ACETAMINOPHEN 325 MG PO TABS
650.0000 mg | ORAL_TABLET | Freq: Four times a day (QID) | ORAL | Status: AC | PRN
  Administered 2024-06-09 – 2024-06-11 (×2): 650 mg via ORAL
  Filled 2024-06-07 (×2): qty 2

## 2024-06-07 MED ORDER — FLUTICASONE FUROATE-VILANTEROL 100-25 MCG/ACT IN AEPB
1.0000 | INHALATION_SPRAY | Freq: Every day | RESPIRATORY_TRACT | Status: DC
  Administered 2024-06-08 – 2024-06-13 (×6): 1 via RESPIRATORY_TRACT
  Filled 2024-06-07: qty 28

## 2024-06-07 MED ORDER — ACETAMINOPHEN 650 MG RE SUPP: 650.0000 mg | Freq: Four times a day (QID) | RECTAL | Status: AC | PRN

## 2024-06-07 MED ORDER — SODIUM CHLORIDE 0.9 % IV SOLN: 500.0000 mg | INTRAVENOUS | Status: DC

## 2024-06-07 MED ORDER — LIDOCAINE 5 % EX PTCH: 1.0000 | MEDICATED_PATCH | CUTANEOUS | Status: DC

## 2024-06-07 MED ORDER — LACTATED RINGERS IV BOLUS
500.0000 mL | Freq: Once | INTRAVENOUS | Status: AC
  Administered 2024-06-07: 500 mL via INTRAVENOUS

## 2024-06-07 MED ORDER — FEBUXOSTAT 40 MG PO TABS
40.0000 mg | ORAL_TABLET | Freq: Every day | ORAL | Status: DC
  Administered 2024-06-08 – 2024-06-13 (×6): 40 mg via ORAL
  Filled 2024-06-07 (×6): qty 1

## 2024-06-07 MED ORDER — ONDANSETRON HCL 4 MG/2ML IJ SOLN: 4.0000 mg | Freq: Four times a day (QID) | INTRAMUSCULAR | Status: DC | PRN

## 2024-06-07 MED ORDER — SODIUM CHLORIDE 0.9 % IV SOLN
100.0000 mg | Freq: Two times a day (BID) | INTRAVENOUS | Status: DC
  Administered 2024-06-07 – 2024-06-11 (×9): 100 mg via INTRAVENOUS
  Filled 2024-06-07 (×10): qty 100

## 2024-06-07 MED ORDER — VITAMIN D 25 MCG (1000 UNIT) PO TABS
2000.0000 [IU] | ORAL_TABLET | Freq: Every day | ORAL | Status: DC
  Administered 2024-06-07 – 2024-06-12 (×6): 2000 [IU] via ORAL
  Filled 2024-06-07 (×6): qty 2

## 2024-06-07 MED ORDER — MAGNESIUM OXIDE -MG SUPPLEMENT 400 (240 MG) MG PO TABS
400.0000 mg | ORAL_TABLET | Freq: Every evening | ORAL | Status: DC
  Administered 2024-06-07 – 2024-06-12 (×6): 400 mg via ORAL
  Filled 2024-06-07 (×6): qty 1

## 2024-06-07 MED ORDER — OXYCODONE HCL 5 MG PO TABS
5.0000 mg | ORAL_TABLET | Freq: Three times a day (TID) | ORAL | Status: AC | PRN
  Administered 2024-06-08 (×2): 5 mg via ORAL
  Filled 2024-06-07 (×2): qty 1

## 2024-06-07 MED ORDER — IPRATROPIUM-ALBUTEROL 0.5-2.5 (3) MG/3ML IN SOLN
3.0000 mL | RESPIRATORY_TRACT | Status: DC | PRN
  Administered 2024-06-07: 3 mL via RESPIRATORY_TRACT
  Filled 2024-06-07: qty 3

## 2024-06-07 MED ORDER — OXYCODONE-ACETAMINOPHEN 5-325 MG PO TABS: 1.0000 | ORAL_TABLET | Freq: Three times a day (TID) | ORAL | Status: DC | PRN

## 2024-06-07 MED ORDER — SODIUM CHLORIDE 0.9 % IV SOLN
12.5000 mg | Freq: Four times a day (QID) | INTRAVENOUS | Status: AC | PRN
  Administered 2024-06-07: 12.5 mg via INTRAVENOUS
  Filled 2024-06-07: qty 0.5

## 2024-06-07 MED ORDER — HYDRALAZINE HCL 20 MG/ML IJ SOLN: 5.0000 mg | Freq: Four times a day (QID) | INTRAMUSCULAR | Status: AC | PRN

## 2024-06-07 MED ORDER — REVEFENACIN 175 MCG/3ML IN SOLN
175.0000 ug | Freq: Every day | RESPIRATORY_TRACT | Status: DC
  Administered 2024-06-08 – 2024-06-13 (×6): 175 ug via RESPIRATORY_TRACT
  Filled 2024-06-07 (×6): qty 3

## 2024-06-07 MED ORDER — SODIUM CHLORIDE 0.9 % IV SOLN
2.0000 g | INTRAVENOUS | Status: AC
  Administered 2024-06-08 – 2024-06-11 (×4): 2 g via INTRAVENOUS
  Filled 2024-06-07 (×4): qty 20

## 2024-06-07 MED ORDER — ONDANSETRON HCL 4 MG PO TABS: 4.0000 mg | ORAL_TABLET | Freq: Four times a day (QID) | ORAL | Status: DC | PRN

## 2024-06-07 MED ORDER — SODIUM CHLORIDE 0.9 % IV SOLN
500.0000 mg | Freq: Once | INTRAVENOUS | Status: AC
  Administered 2024-06-07: 500 mg via INTRAVENOUS
  Filled 2024-06-07: qty 5

## 2024-06-07 MED ORDER — DOCUSATE SODIUM 100 MG PO CAPS
100.0000 mg | ORAL_CAPSULE | Freq: Two times a day (BID) | ORAL | Status: DC
  Administered 2024-06-07 – 2024-06-13 (×12): 100 mg via ORAL
  Filled 2024-06-07 (×12): qty 1

## 2024-06-07 MED ORDER — LACTATED RINGERS IV SOLN: INTRAVENOUS | Status: DC

## 2024-06-07 MED ORDER — PROMETHAZINE HCL 25 MG/ML IJ SOLN
INTRAMUSCULAR | Status: AC
  Filled 2024-06-07: qty 1

## 2024-06-07 MED ORDER — FERROUS GLUCONATE 324 (38 FE) MG PO TABS
324.0000 mg | ORAL_TABLET | Freq: Every day | ORAL | Status: DC
  Administered 2024-06-08 – 2024-06-13 (×6): 324 mg via ORAL
  Filled 2024-06-07 (×6): qty 1

## 2024-06-07 MED ORDER — TORSEMIDE 20 MG PO TABS
40.0000 mg | ORAL_TABLET | Freq: Every day | ORAL | Status: DC
  Administered 2024-06-08 – 2024-06-13 (×6): 40 mg via ORAL
  Filled 2024-06-07 (×6): qty 2

## 2024-06-07 MED ORDER — VITAMIN B-12 1000 MCG PO TABS
1000.0000 ug | ORAL_TABLET | Freq: Every day | ORAL | Status: DC
  Administered 2024-06-08 – 2024-06-13 (×6): 1000 ug via ORAL
  Filled 2024-06-07 (×6): qty 1

## 2024-06-07 MED ORDER — SODIUM CHLORIDE 0.9 % IV SOLN
1.0000 g | Freq: Once | INTRAVENOUS | Status: AC
  Administered 2024-06-07: 1 g via INTRAVENOUS
  Filled 2024-06-07: qty 10

## 2024-06-07 MED ORDER — SENNOSIDES-DOCUSATE SODIUM 8.6-50 MG PO TABS: 1.0000 | ORAL_TABLET | Freq: Every evening | ORAL | Status: DC | PRN

## 2024-06-07 MED ORDER — GABAPENTIN 300 MG PO CAPS
300.0000 mg | ORAL_CAPSULE | Freq: Every day | ORAL | Status: DC
  Administered 2024-06-07 – 2024-06-12 (×6): 300 mg via ORAL
  Filled 2024-06-07 (×6): qty 1

## 2024-06-07 MED ORDER — APIXABAN 5 MG PO TABS: 5.0000 mg | ORAL_TABLET | Freq: Two times a day (BID) | ORAL | Status: DC

## 2024-06-07 MED ORDER — OXYCODONE-ACETAMINOPHEN 5-325 MG PO TABS
1.0000 | ORAL_TABLET | Freq: Three times a day (TID) | ORAL | Status: AC | PRN
  Administered 2024-06-08 (×2): 1 via ORAL
  Filled 2024-06-07 (×2): qty 1

## 2024-06-07 MED ORDER — LACTATED RINGERS IV SOLN: INTRAVENOUS | Status: AC

## 2024-06-07 NOTE — ED Notes (Signed)
Called [facility] for hospitalist consult

## 2024-06-07 NOTE — Assessment & Plan Note (Signed)
 And abdominal musculoskeletal pain Lidocaine  patches ordered Oxycodone  5-325 mg +5 mg every 8 hours as needed for moderate-severe pain

## 2024-06-07 NOTE — Plan of Care (Signed)
 Patient seen and examined.  78 year old female with history of COPD on 4 L oxygen  at baseline, HFpEF, A-fib on Eliquis , recurrent diverticulitis status post colostomy recently admitted to the hospital on 05/30/2024 for acute COPD exacerbation due to influenza A infection presents back to the ER because of worsening shortness of breath and cough with increased oxygen  requirement.  Chest x-ray shows worsening retrocardiac infiltrates concerning for pneumonia was started on empiric antibiotics.  Admitted for further management.  I reviewed patient's labs and orders and medications.  Blood pressure 150/67 pulse 72/min respiration 22/min temperature 97.9  HEENT anicteric no pallor. Chest bilateral air present mild expiratory wheeze. Heart S1-S2 heard. Abdomen soft.  Nontender.  Assessment and plan -     #1.  Community-acquired pneumonia started on empiric antibiotics.  Follow respiratory closely.  #2.  Chronic respiratory failure secondary to COPD on 4 L oxygen .  Continue home inhalers.  #3.  A-fib on Eliquis  for anticoagulation.  Tikosyn .  #4.  History of chronic HFpEF hypertension on spironolactone  and torsemide .  Redia Cleaver MD

## 2024-06-07 NOTE — Assessment & Plan Note (Signed)
 Continue oxygen  supplementation to maintain SpO2 greater than 92% DuoNebs every 4 hours as needed for wheezing and shortness of breath Resume home long-acting inhaler equivalent Yupelri  nebulizer daily

## 2024-06-07 NOTE — Assessment & Plan Note (Signed)
 With productive yellow sputum Guaifenesin  during the day to loosen phlegm Tussionex nightly as needed for coughing at night

## 2024-06-07 NOTE — ED Provider Notes (Signed)
 " [place] [facility] Provider Note   CSN: [id number] Arrival date & time: 06/07/24  1023     Patient presents with: Nausea   Kathleen [name] is a 78 y.o. female.   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  She has now been home approximately 6 days and have been doing okay other than feeling generally weak due to her prolonged hospitalization.  However yesterday she started having worsening cough, low-grade fever and nausea.  She reports dry heaves throughout the night.  Her appetite has not been great but she is reporting worsening cough and myalgias.  She has lost 12 pounds and has not noticed any swelling in her legs.  She has been using her inhalers but has not done any breathing treatments this morning.  She has not had any specific abdominal pain and continues to have output in her ostomy.  The history is provided by the patient.       Prior to Admission medications  Medication Sig Start Date End Date Taking? Authorizing Provider  azelastine  (ASTELIN ) 0.1 % nasal spray Place 2 sprays into both nostrils 2 (two) times daily. 03/30/24   Jude Harden GAILS, MD  BREO ELLIPTA  100-25 MCG/ACT AEPB inhale ONE PUFF into THE lungs daily. 04/18/24   Jude Harden GAILS, MD  Cholecalciferol  (VITAMIN D3) 50 MCG (2000 UT) TABS Take 2,000 Units by mouth at bedtime.    [provider]  cyanocobalamin  (VITAMIN B12) 1000 MCG tablet Take 1,000 mcg by mouth daily.    [provider]  docusate sodium  (COLACE) 100 MG capsule Take 100 mg by mouth 2 (two) times daily.    [provider]  dofetilide  (TIKOSYN ) 250 MCG capsule TAKE ONE CAPSULE BY MOUTH EVERY MORNING AND AT BEDTIME. 04/14/24   Rolan Ezra RAMAN, MD  ELIQUIS  5 MG TABS tablet Take 1 tablet (5 mg total) by mouth 2 (two) times  daily. 09/23/23   Bensimhon, Toribio SAUNDERS, MD  febuxostat  (ULORIC ) 40 MG tablet Take 1 tablet (40 mg total) by mouth daily. 01/25/24   Jeannetta Lonni ORN, MD  ferrous gluconate  [facility]) 324 MG tablet Take 1 tablet (324 mg total) by mouth daily with breakfast. 03/28/24   Mansouraty, Aloha Raddle., MD  gabapentin  (NEURONTIN ) 300 MG capsule Take 1 capsule (300 mg total) by mouth at bedtime. 09/23/23 09/17/24  Gregg Lek, MD  ketoconazole  (NIZORAL ) 2 % cream Apply 1 Application topically 2 (two) times daily. 10/21/23   Joshua Debby CROME, MD  Magnesium  500 MG TABS Take 500 mg by mouth every evening.    [provider]  mupirocin  ointment (BACTROBAN ) 2 % Apply 1 Application topically 2 (two) times daily. Patient not taking: Reported on 06/05/2024 10/22/23   Joshua Debby CROME, MD  naloxone  (NARCAN ) nasal spray 4 mg/0.1 mL Place 1 spray into the nose once as needed (opioid overdose). 11/12/23   [provider]  oxyCODONE -acetaminophen  (PERCOCET) 10-325 MG tablet Take 0.5-1 tablets by mouth See admin instructions. *1-3 times daily* 05/16/24   [provider]  OXYGEN  Inhale 4 L into the lungs continuous. Use with resmed ventilator    [provider]  potassium chloride  (KLOR-CON ) 10 MEQ tablet Take 4 tablets (40 mEq total) by mouth daily. 01/05/24 06/05/24  Glena Harlene HERO, FNP  predniSONE  (DELTASONE ) 50 MG tablet Take  1 tablet (50 mg total) by mouth daily with breakfast. 06/03/24   Gonfa, Taye T, MD  revefenacin  (YUPELRI ) 175 MCG/3ML nebulizer solution Take 175 mcg by nebulization daily.    [provider]  spironolactone  (ALDACTONE ) 25 MG tablet Take 1 tablet (25 mg total) by mouth every evening. 01/10/24   Lee, Jordan, NP  torsemide  (DEMADEX ) 20 MG tablet Take 2 tablets (40 mg total) by mouth daily. 01/10/24   Lee, Jordan, NP  triamcinolone  cream (KENALOG ) 0.1 % Apply 1 Application topically 2 (two) times daily. 07/02/23   Cyndi Shaver, PA-C    Allergies: Allopurinol,  Clindamycin, Flublok [influenza vaccine recombinant], Pneumococcal 13-val conj vacc, Dronedarone, Montelukast , Brovana  [arformoterol ], Budesonide , Entresto  [sacubitril -valsartan ], Fosamax [alendronate sodium], Jardiance  [empagliflozin ], Meperidine, Microplegia msa-msg [plegisol], Rosuvastatin, Tetracycline, Adhesive [tape], and Lovastatin    Review of Systems  Updated Vital Signs BP (!) 150/106 (BP Location: Left Wrist)   Pulse 75   Temp 98.2 F (36.8 C) (Oral)   Resp 20   Ht 5' (1.524 m)   Wt 85.6 kg   SpO2 99%   BMI 36.86 kg/m   Physical Exam Vitals and nursing note reviewed.  Constitutional:      General: She is not in acute distress.    Appearance: She is well-developed.  HENT:     Head: Normocephalic and atraumatic.     Mouth/Throat:     Mouth: Mucous membranes are dry.  Eyes:     Pupils: Pupils are equal, round, and reactive to light.  Cardiovascular:     Rate and Rhythm: Normal rate and regular rhythm.     Heart sounds: Normal heart sounds. No murmur heard.    No friction rub.  Pulmonary:     Effort: Pulmonary effort is normal.     Breath sounds: Examination of the right-upper field reveals wheezing and rhonchi. Examination of the right-middle field reveals wheezing and rhonchi. Examination of the right-lower field reveals wheezing and rhonchi. Wheezing and rhonchi present. No rales.  Abdominal:     General: Bowel sounds are normal. There is no distension.     Palpations: Abdomen is soft.     Tenderness: There is no abdominal tenderness. There is no guarding or rebound.  Musculoskeletal:        General: No tenderness. Normal range of motion.     Right lower leg: No edema.     Left lower leg: No edema.     Comments: No edema  Skin:    General: Skin is warm and dry.     Findings: No rash.  Neurological:     Mental Status: She is alert and oriented to person, place, and time.     Cranial Nerves: No cranial nerve deficit.  Psychiatric:        Behavior: Behavior  normal.     (all labs ordered are listed, but only abnormal results are displayed) Labs Reviewed  LACTIC ACID, PLASMA - Abnormal; Notable for the following components:      Result Value   Lactic Acid, Venous 2.2 (*)    All other components within normal limits  COMPREHENSIVE METABOLIC PANEL WITH GFR - Abnormal; Notable for the following components:   Chloride 97 (*)    Glucose, Bld 140 (*)    Anion gap 17 (*)    All other components within normal limits  CBC WITH DIFFERENTIAL/PLATELET - Abnormal; Notable for the following components:   WBC 12.8 (*)    RBC 5.38 (*)    Neutro Abs 10.5 (*)  Abs Immature Granulocytes 0.28 (*)    All other components within normal limits  CULTURE, BLOOD (ROUTINE X 2)  CULTURE, BLOOD (ROUTINE X 2)  PROTIME-INR  LACTIC ACID, PLASMA    EKG: EKG Interpretation Date/Time:  Wednesday June 07 2024 11:02:39 EST Ventricular Rate:  71 PR Interval:  199 QRS Duration:  135 QT Interval:  429 QTC Calculation: 467 R Axis:   -79  Text Interpretation: A-V dual-paced rhythm with some inhibition No further analysis attempted due to paced rhythm No significant change since last tracing Confirmed by Doretha Folks ([id number]) on 06/07/2024 12:00:56 PM  Radiology: [facility] Chest Port 1 View Result Date: 06/07/2024 EXAM: 1 VIEW(S) XRAY OF THE CHEST 06/07/2024 11:22:00 AM COMPARISON: 05/29/2024 CLINICAL HISTORY: Questionable sepsis - evaluate for abnormality FINDINGS: LINES, TUBES AND DEVICES: 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 LUNGS AND PLEURA: Patient is rotated. Hyperinflation of the lungs. Chronic coarse and interstitial markings. Increase of retrocardiac airspace opacity. Left costophrenic angle blunting with possible trace left pleural effusion. No pneumothorax. HEART AND MEDIASTINUM: Unchanged cardiomediastinal silhouette. Atherosclerotic plaque. BONES AND SOFT TISSUES: Degenerative changes in both shoulders. No acute osseous abnormality.  IMPRESSION: 1. Increased retrocardiac airspace opacity, concerning for pneumonia. Left costophrenic angle blunting with possible trace left pleural effusion.Follow-up PA and lateral chest X-ray is recommended in 3-4 weeks following therapy to ensure resolution . Electronically signed by: Morgane Naveau MD 06/07/2024 12:27 PM EST RP Workstation: [id number]     Procedures   Medications Ordered in the ED  cefTRIAXone  (ROCEPHIN ) 1 g in sodium chloride  0.9 % 100 mL IVPB (has no administration in time range)  azithromycin  (ZITHROMAX ) 500 mg in sodium chloride  0.9 % 250 mL IVPB (has no administration in time range)  ondansetron  (ZOFRAN ) injection 4 mg (4 mg Intravenous Given 06/07/24 1134)  ipratropium-albuterol  (DUONEB) 0.5-2.5 (3) MG/3ML nebulizer solution 3 mL (3 mLs Nebulization Given 06/07/24 1123)  lactated ringers  bolus 500 mL (500 mLs Intravenous New Bag/Given 06/07/24 1229)  methylPREDNISolone  sodium succinate (SOLU-MEDROL ) 125 mg/2 mL injection 125 mg (125 mg Intravenous Given 06/07/24 1247)  benzonatate  (TESSALON ) capsule 100 mg (100 mg Oral Given 06/07/24 1248)                                    Medical Decision Making Amount and/or Complexity of Data Reviewed External Data Reviewed: notes. Labs: ordered. Decision-making details documented in ED Course. Radiology: ordered and independent interpretation performed. Decision-making details documented in ED Course. ECG/medicine tests: ordered and independent interpretation performed. Decision-making details documented in ED Course.  Risk Prescription drug management. Decision regarding hospitalization.   Pt with multiple medical problems and comorbidities and presenting today with a complaint that caries a high risk for morbidity and mortality.  Here today with the above complaints.  Abnormal exam findings as described in physical exam.  Concern for post influenza pneumonia versus COPD exacerbation as she did finish her prednisone  4  days ago after hospitalization.  Low suspicion for CHF at this time or PE as patient is anticoagulated and is not having signs of fluid overload and has actually lost 12 pounds.  Also patient has no abdominal pain and has been having regular output and low suspicion for an acute GI process.  Patient was given a DuoNeb and antiemetic.  12:57 PM Patient continues to have persistent coughing and some shortness of breath.  I independently interpreted patient's EKG and labs.  EKG shows a  paced rhythm which is unchanged, lactic acid is elevated at 2.2, CMP without acute findings other than an anion gap of 17, CBC with leukocytosis of 12.8 with normal hemoglobin and INR is normal  I have independently visualized and interpreted pt's images today.  Chest x-ray with concern for possible pneumonia.  Radiology reports an increased retrocardiac air opacity concerning for pneumonia and left costophrenic angle blunting with possible trace pleural effusion.  Given patient's other medical problems, recent influenza concern for post influenza pneumonia as well as possible COPD exacerbation.  Patient was given Rocephin  and azithromycin .  She had also received Solu-Medrol .  Feel that patient will need admission for further care.  She and her family member who is present at bedside agree with this plan.  Consulted hospitalist for admission.       Final diagnoses:  Community acquired pneumonia, unspecified laterality  COPD with acute exacerbation [facility])    ED Discharge Orders     None          Doretha Folks, MD 06/07/24 1257  "

## 2024-06-07 NOTE — Assessment & Plan Note (Signed)
 Home apixaban  5 mg p.o. twice daily, Tikosyn  250 mcg daily

## 2024-06-07 NOTE — H&P (Addendum)
 " History and Physical - Telemedicine  AMBERROSE FRIEBEL FMW:[id number] DOB: Oct 15, 1945 DOA: 06/07/2024  PCP: Joshua Debby CROME, MD  Patient coming from: home  Referring provider: Dr. Doretha, EDP Telemedicine provider: Dr. Sherre Patient location: [facility], [place], [place] Referring diagnosis: pneumonia Patient name and DOB verified: Patient was able to verify her first and last name: Kathleen [name], date of birth: [birth year]. Patient consented to Telemedicine Evaluation: yes RN virtual assistant: Shelba Booty, RN Video encounter time and date: 06/07/2024 and at approximately: 14:23  Chief Concern: shortness of breath, cough  HPI: Kathleen [name] is a 78 year old female with history of COPD, requiring chronic 4 L nasal cannula, atrial fibrillation on Eliquis , heart failure preserved ejection fraction, history of recurrent diverticulitis status post colostomy.  06/07/2024: She presents ED for chief concerns of fever, cough, nausea.  Vitals at the time of my evaluation showed t of 98.2, rr 19, heart rate 70, blood pressure 130/61, SpO2 100% on 5 L nasal cannula.  Serum sodium is 140, potassium 3.7, chloride 97, bicarb 26, BUN is 21, serum creatinine is 2.85, eGFR greater than 60, nonfasting blood glucose 140, WBC 12.8, hemoglobin 14.8, platelets of 253.  Lactic acid was elevated at 2.2.  On repeat was normalized.  ED treatment: Tessalon  100 mg p.o. one-time dose, DuoNebs, Solu-Medrol  125 mg IV one-time dose, ondansetron  4 mg IV one-time dose, ceftriaxone  1 g IV one-time dose, azithromycin  500 mg IV one-time dose.  06/07/2024: Patient admitted to the hospitalist service for chief concerns of superimposed bacterial pneumonia in setting of recent influenza infection. ------------------------------- At bedside, via telemedicine encounter, patient was able to confirm her first and last name, her date of birth, and the current calendar year.  Patient reports she started feeling nausea,  fever, cough yesterday.  She reports she was discharged home from the hospital on Friday, 12/26.  She reports she was feeling fine until yesterday.  She reports she was too weak to actually check a temperature however she felt really hot.  She endorses nausea and denies vomiting.  She reports chest tenderness and abdominal tenderness that is worsened with cough.  She denies syncope, swelling of the lower extremities.  She does not make a bowel movement as she has a colostomy bag.  She does not notice any blood in the colostomy bag.  She denies dysuria, hematuria and swelling of her lower extremities.  Social history: She lives at home.  She is a former tobacco user, quitting in 1989.  At her most she was smoking half pack per day for 20 years.  She also grew up in a household where her father smoked cigarettes inside the home.  She denies EtOH and recreational drug use.  She is retired and currently lives at independent living facility.  ROS: Constitutional: no weight change, no fever ENT/Mouth: no sore throat, no rhinorrhea Eyes: no eye pain, no vision changes Cardiovascular: + dyspnea,  no edema, no palpitations Respiratory: + cough, + sputum (yellow sputum), no wheezing Gastrointestinal: no nausea, no vomiting, no diarrhea, no constipation Genitourinary: no urinary incontinence, no dysuria, no hematuria Musculoskeletal: no arthralgias, + myalgias, + chest and abdominal tenderness Skin: no skin lesions, no pruritus, Neuro: + weakness, no loss of consciousness, no syncope Psych: no anxiety, no depression, + decrease appetite Heme/Lymph: no bruising, no bleeding  ED Course: Discussed with EDP, patient requiring hospitalization for chief concerns of pneumonia superimposed on recent influenza infection.  Assessment/Plan  Principal Problem:   Community acquired pneumonia Active Problems:  Chronic respiratory failure with hypoxia (HCC)   COPD (chronic obstructive pulmonary disease) (HCC)    HTN (hypertension)   Persistent atrial fibrillation (HCC)   Cough   Muscular chest pain   Protein-calorie malnutrition, moderate   Physical deconditioning    Assessment and Plan: * Community acquired pneumonia Check MRSA PCR Continue with ceftriaxone  2 g IV daily to complete a 5-day course Azithromycin  500 mg IV daily was considered however in setting of Tikosyn  use and risk of QT prolongation, will change to doxycycline  100 mg IV twice daily to complete a 5-day course Incentive spirometry, flutter valve  COPD (chronic obstructive pulmonary disease) (HCC) Continue oxygen  supplementation to maintain SpO2 greater than 92% DuoNebs every 4 hours as needed for wheezing and shortness of breath Resume home long-acting inhaler equivalent Yupelri  nebulizer daily  Chronic respiratory failure with hypoxia (HCC) On 4 L nasal cannula at baseline continuously  Muscular chest pain And abdominal musculoskeletal pain Lidocaine  patches ordered Oxycodone  5-325 mg +5 mg every 8 hours as needed for moderate-severe pain  Cough With productive yellow sputum Guaifenesin  during the day to loosen phlegm Tussionex nightly as needed for coughing at night  Persistent atrial fibrillation (HCC) Home apixaban  5 mg p.o. twice daily, Tikosyn  250 mcg daily  HTN (hypertension) Home spironolactone  25 mg every evening resumed Torsemide  40 mg daily resumed  Chart reviewed.   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    Concussion 10/04/2021   COPD (chronic obstructive pulmonary disease) (HCC)    Diverticulitis    Emphysema of lung (HCC) 2013   lung collapsed   Gout    Hypertension    Longstanding persistent atrial fibrillation (HCC)    on Xarelto    Nonischemic cardiomyopathy (HCC)    Obesity    Oxygen  deficiency 2013   Pacemaker    Spontaneous pneumothorax 2013   Thyroid  disease 1990   Past Surgical History:  Procedure Laterality Date   ABDOMINAL HYSTERECTOMY     APPENDECTOMY     APPLICATION OF WOUND VAC  04/27/2023   Procedure: APPLICATION OF WOUND VAC;  Surgeon: Ebbie Cough, MD;  Location: [facility] OR;  Service: General;;   ATRIAL FIBRILLATION ABLATION  07/20/2013   by Dr Pierrette   AV nodal ablation  11/01/2013   by Dr Pierrette, repeated by Dr Rosine   BREAST BIOPSY Bilateral 1997   negative   CARDIAC CATHETERIZATION     CESAREAN SECTION  1976   CHOLECYSTECTOMY     COLON RESECTION SIGMOID N/A 04/27/2023   Procedure: COLON RESECTION SIGMOID WITH COLOSTOMY;  Surgeon: Ebbie Cough, MD;  Location: [facility] OR;  Service: General;  Laterality: N/A;   COLON SURGERY  2024   ESOPHAGOGASTRODUODENOSCOPY (EGD) WITH PROPOFOL  N/A 05/24/2023   Procedure: ESOPHAGOGASTRODUODENOSCOPY (EGD) WITH PROPOFOL ;  Surgeon: Albertus Gordy HERO, MD;  Location: [facility] ENDOSCOPY;  Service: Gastroenterology;  Laterality: N/A;   HERNIA REPAIR     LAPAROTOMY N/A 04/27/2023   Procedure: EXPLORATORY LAPAROTOMY;  Surgeon: Ebbie Cough, MD;  Location: [facility] OR;  Service: General;  Laterality: N/A;   PACEMAKER INSERTION  06/2017   MDT Viva CRT-P implanted by Dr Pierrette after AV nodal ablation,  LV lead could not be placed   PACEMAKER INSERTION  05/2020   with lead bundle   RIGHT/LEFT HEART CATH AND CORONARY ANGIOGRAPHY N/A 03/20/2022   Procedure: RIGHT/LEFT HEART CATH AND CORONARY ANGIOGRAPHY;  Surgeon: Jordan, Peter M, MD;  Location: [facility] INVASIVE CV LAB;  Service:  Cardiovascular;  Laterality: N/A;   TUBAL LIGATION  1976   Social History:  reports that she quit smoking about 37 years ago. Her smoking use included cigarettes. She started smoking about 57 years ago. She has a 20 pack-year smoking history. She has been exposed to tobacco smoke. She has never used smokeless tobacco. She reports that she does not drink alcohol and does not use drugs.  Allergies[1] Family History  Problem Relation Age of Onset   Breast cancer Mother 73       3 different times   Pancreatic cancer Mother    Emphysema Father    Healthy Brother    Breast cancer Cousin    Valvular heart disease Son    Obesity Daughter    Colon cancer Neg Hx    Esophageal cancer Neg Hx    Stomach cancer Neg Hx    Inflammatory bowel disease Neg Hx    Liver disease Neg Hx    Rectal cancer Neg Hx    Family history: Family history reviewed and not pertinent.  Prior to Admission medications  Medication Sig Start Date End Date Taking? Authorizing Provider  azelastine  (ASTELIN ) 0.1 % nasal spray Place 2 sprays into both nostrils 2 (two) times daily. 03/30/24  Yes Jude Harden GAILS, MD  BREO ELLIPTA  100-25 MCG/ACT AEPB inhale ONE PUFF into THE lungs daily. 04/18/24  Yes Jude Harden GAILS, MD  Cholecalciferol  (VITAMIN D3) 50 MCG (2000 UT) TABS Take 2,000 Units by mouth at bedtime.   Yes [provider]  cyanocobalamin  (VITAMIN B12) 1000 MCG tablet Take 1,000 mcg by mouth daily.   Yes [provider]  docusate sodium  (COLACE) 100 MG capsule Take 100 mg by mouth 2 (two) times daily.   Yes [provider]  dofetilide  (TIKOSYN ) 250 MCG capsule TAKE ONE CAPSULE BY MOUTH EVERY MORNING AND AT BEDTIME. 04/14/24  Yes Rolan Ezra RAMAN, MD  ELIQUIS  5 MG TABS tablet Take 1 tablet (5 mg total) by mouth 2 (two) times daily. 09/23/23  Yes Bensimhon, Toribio SAUNDERS, MD  febuxostat  (ULORIC ) 40 MG tablet Take 1 tablet (40 mg total) by mouth daily. 01/25/24  Yes Rice, Lonni ORN, MD  ferrous  gluconate (FERGON) 324 MG tablet Take 1 tablet (324 mg total) by mouth daily with breakfast. 03/28/24  Yes Mansouraty, Aloha Raddle., MD  gabapentin  (NEURONTIN ) 300 MG capsule Take 1 capsule (300 mg total) by mouth at bedtime. 09/23/23 09/17/24 Yes Camara, Pastor, MD  guaifenesin  (ROBITUSSIN) 100 MG/5ML syrup Take 200 mg by mouth 3 (three) times daily as needed for cough.   Yes [provider]  ketoconazole  (NIZORAL ) 2 % cream Apply 1 Application topically 2 (two) times daily. 10/21/23  Yes Joshua Debby CROME, MD  Magnesium  500 MG TABS Take 500 mg by mouth every evening.   Yes [provider]  mupirocin  ointment (BACTROBAN ) 2 % Apply 1 Application topically 2 (two) times daily. 10/22/23  Yes Joshua Debby CROME, MD  oxyCODONE -acetaminophen  (PERCOCET) 10-325 MG tablet Take 0.5-1 tablets  by mouth See admin instructions. *1-3 times daily* 05/16/24  Yes [provider]  OXYGEN  Inhale 4 L into the lungs continuous. Use with resmed ventilator   Yes [provider]  potassium chloride  (KLOR-CON ) 10 MEQ tablet Take 4 tablets (40 mEq total) by mouth daily. 01/05/24 06/07/24 Yes Milford, Harlene HERO, FNP  predniSONE  (DELTASONE ) 50 MG tablet Take 1 tablet (50 mg total) by mouth daily with breakfast. 06/03/24  Yes Gonfa, Taye T, MD  revefenacin  (YUPELRI ) 175 MCG/3ML nebulizer solution Take 175 mcg by nebulization daily.   Yes [provider]  spironolactone  (ALDACTONE ) 25 MG tablet Take 1 tablet (25 mg total) by mouth every evening. 01/10/24  Yes Lee, Jordan, NP  torsemide  (DEMADEX ) 20 MG tablet Take 2 tablets (40 mg total) by mouth daily. 01/10/24  Yes Lee, Jordan, NP  triamcinolone  cream (KENALOG ) 0.1 % Apply 1 Application topically 2 (two) times daily. 07/02/23  Yes Drubel, Manuelita, PA-C  naloxone  (NARCAN ) nasal spray 4 mg/0.1 mL Place 1 spray into the nose once as needed (opioid overdose). 11/12/23   [provider]   Physical Exam completed with assistance of: Shelba Booty,  RN, who was at bedside during this portion of the virtual encounter:  Vitals:   06/07/24 1037 06/07/24 1123 06/07/24 1300 06/07/24 1330  BP: (!) 150/106  125/72 130/61  Pulse: 75  69 70  Resp: 20  19 19   Temp:      TempSrc:      SpO2: 97% 99% 100% 100%  Weight:      Height:       Constitutional: appears age appropriate, frail, calm Eyes: EOMI,  conjunctivae normal HENMT: atraumatic, normocephalic, hearing appropriate Neck: normal, supple, no masses, no thyromegaly Respiratory: Diffuse rhonchi throughout, no wheezing. Mild increase respiratory effort. No accessory muscle use.  Cardiovascular: Regular rate and rhythm Abdomen: no tenderness. Bowel sounds positive.  Musculoskeletal: No joint deformity upper and lower extremities. Good ROM, no contractures, no atrophy. Skin: no rashes, ulcers on visible skin Neurologic: Strength is appropriate upper extremities.  Psychiatric: Normal judgment and insight. Alert and oriented x 3. Normal mood.   EKG: independently reviewed, showing dual paced with rate of 71, QTc 467  Chest x-ray on Admission: I personally reviewed and I agree with radiologist reading as below.  DG Chest Port 1 View Result Date: 06/07/2024 EXAM: 1 VIEW(S) XRAY OF THE CHEST 06/07/2024 11:22:00 AM COMPARISON: 05/29/2024 CLINICAL HISTORY: Questionable sepsis - evaluate for abnormality FINDINGS: LINES, TUBES AND DEVICES: 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 LUNGS AND PLEURA: Patient is rotated. Hyperinflation of the lungs. Chronic coarse and interstitial markings. Increase of retrocardiac airspace opacity. Left costophrenic angle blunting with possible trace left pleural effusion. No pneumothorax. HEART AND MEDIASTINUM: Unchanged cardiomediastinal silhouette. Atherosclerotic plaque. BONES AND SOFT TISSUES: Degenerative changes in both shoulders. No acute osseous abnormality. IMPRESSION: 1. Increased retrocardiac airspace opacity, concerning for pneumonia. Left  costophrenic angle blunting with possible trace left pleural effusion.Follow-up PA and lateral chest X-ray is recommended in 3-4 weeks following therapy to ensure resolution . Electronically signed by: Kate Plummer MD 06/07/2024 12:27 PM EST RP Workstation: [id number]   Labs on Admission: I have personally reviewed following labs  CBC: Recent Labs  Lab 06/02/24 0334 06/07/24 1102  WBC 6.1 12.8*  NEUTROABS  --  10.5*  HGB 11.7* 14.8  HCT 36.5 44.7  MCV 85.3 83.1  PLT 159 253   Basic Metabolic Panel: Recent Labs  Lab 06/01/24 0349 06/02/24 0334 06/07/24 1102  NA  --  140 140  K  --  4.4 3.7  CL  --  104 97*  CO2  --  25 26  GLUCOSE  --  184* 140*  BUN  --  16 21  CREATININE  --  0.60 0.85  CALCIUM   --  9.8 9.7  MG 2.4 2.3  --   PHOS  --  2.7  --    GFR: Estimated Creatinine Clearance: 53.8 mL/min (by C-G formula based on SCr of 0.85 mg/dL).  Liver Function Tests: Recent Labs  Lab 06/02/24 0334 06/07/24 1102  AST  --  16  ALT  --  14  ALKPHOS  --  54  BILITOT  --  0.7  PROT  --  7.2  ALBUMIN  3.7 4.6   Coagulation Profile: Recent Labs  Lab 06/07/24 1102  INR 1.0   Urine analysis:    Component Value Date/Time   COLORURINE YELLOW 01/19/2024 1536   APPEARANCEUR CLOUDY (A) 01/19/2024 1536   APPEARANCEUR Clear 09/23/2023 1118   LABSPEC 1.013 01/19/2024 1536   LABSPEC 1.021 12/25/2012 1653   PHURINE 7.0 01/19/2024 1536   GLUCOSEU NEGATIVE 01/19/2024 1536   GLUCOSEU NEGATIVE 08/10/2023 1012   HGBUR MODERATE (A) 01/19/2024 1536   BILIRUBINUR NEGATIVE 01/19/2024 1536   BILIRUBINUR negative 12/06/2023 1719   BILIRUBINUR Negative 09/23/2023 1118   BILIRUBINUR Negative 12/25/2012 1653   KETONESUR NEGATIVE 01/19/2024 1536   PROTEINUR 30 (A) 01/19/2024 1536   UROBILINOGEN 0.2 12/06/2023 1719   UROBILINOGEN 0.2 08/10/2023 1012   NITRITE NEGATIVE 01/19/2024 1536   LEUKOCYTESUR LARGE (A) 01/19/2024 1536   LEUKOCYTESUR Negative 12/25/2012 1653   This document  was prepared using Dragon Voice Recognition software and may include unintentional dictation errors.  Dr. Sherre [facility] My Location: [place]  If 7PM-7AM, please contact overnight-coverage provider If 7AM-7PM, please contact day attending provider [email or web address]  06/07/2024, 3:18 PM      [1]  Allergies Allergen Reactions   Allopurinol Other (See Comments)    Reaction:  Dizziness    Clindamycin Anaphylaxis and Hives   Flublok [Influenza Vaccine Recombinant] Other (See Comments)    Fever 103 with no alternative explanation day after vaccine.  Darice Highfill FNP-C   Pneumococcal 13-Val Conj Vacc Itching, Swelling and Rash   Dronedarone Rash   Montelukast  Other (See Comments)    Makes her loopy.   Brovana  [Arformoterol ]     Caused muscle pain   Budesonide      Caused extreme joint pain   Entresto  [Sacubitril -Valsartan ] Other (See Comments)    hypotension   Fosamax [Alendronate Sodium] Nausea Only   Jardiance  [Empagliflozin ]     Caused a vaginal infection   Meperidine Nausea And Vomiting   Microplegia Msa-Msg [Plegisol]     Loopy,diarrhea   Rosuvastatin Other (See Comments)    Reaction:  Muscle spasms    Tetracycline Hives   Adhesive [Tape] Rash   Lovastatin Rash and Other (See Comments)    Muscle Pain   "

## 2024-06-07 NOTE — Assessment & Plan Note (Signed)
 On 4 L nasal cannula at baseline continuously

## 2024-06-07 NOTE — Assessment & Plan Note (Signed)
 Home spironolactone  25 mg every evening resumed Torsemide  40 mg daily resumed

## 2024-06-07 NOTE — ED Notes (Signed)
 RN called to let them know the patient was leaving and to look over the hand off and call if they have any questions.

## 2024-06-07 NOTE — Assessment & Plan Note (Addendum)
 Check MRSA PCR Continue with ceftriaxone  2 g IV daily to complete a 5-day course Azithromycin  500 mg IV daily was considered however in setting of Tikosyn  use and risk of QT prolongation, will change to doxycycline  100 mg IV twice daily to complete a 5-day course Incentive spirometry, flutter valve

## 2024-06-07 NOTE — Hospital Course (Addendum)
 Ms. Yohanna Tow is a 78 year old female with history of COPD, requiring chronic 4 L nasal cannula, atrial fibrillation on Eliquis , heart failure preserved ejection fraction, history of recurrent diverticulitis status post colostomy.  06/07/2024: She presents ED for chief concerns of fever, cough, nausea.  Vitals at the time of my evaluation showed t of 98.2, rr 19, heart rate 70, blood pressure 130/61, SpO2 100% on 5 L nasal cannula.  Serum sodium is 140, potassium 3.7, chloride 97, bicarb 26, BUN is 21, serum creatinine is 2.85, eGFR greater than 60, nonfasting blood glucose 140, WBC 12.8, hemoglobin 14.8, platelets of 253.  Lactic acid was elevated at 2.2.  On repeat was normalized.  ED treatment: Tessalon  100 mg p.o. one-time dose, DuoNebs, Solu-Medrol  125 mg IV one-time dose, ondansetron  4 mg IV one-time dose, ceftriaxone  1 g IV one-time dose, azithromycin  500 mg IV one-time dose.  06/07/2024: Patient admitted to the hospitalist service for chief concerns of superimposed bacterial pneumonia in setting of recent influenza infection.

## 2024-06-07 NOTE — ED Triage Notes (Signed)
 Was admitted to [facility] last week with the Flu. Last night started having nausea. Continues to have cough. Wearing 5L O2, increased from 4.5 at baseline. Denies abdominal pain

## 2024-06-07 NOTE — Progress Notes (Signed)
 Elink following for sepsis protocol.

## 2024-06-07 NOTE — ED Notes (Signed)
 ED Provider at bedside.

## 2024-06-08 DIAGNOSIS — N189 Chronic kidney disease, unspecified: Secondary | ICD-10-CM | POA: Diagnosis present

## 2024-06-08 DIAGNOSIS — Z79899 Other long term (current) drug therapy: Secondary | ICD-10-CM | POA: Diagnosis not present

## 2024-06-08 DIAGNOSIS — I130 Hypertensive heart and chronic kidney disease with heart failure and stage 1 through stage 4 chronic kidney disease, or unspecified chronic kidney disease: Secondary | ICD-10-CM | POA: Diagnosis present

## 2024-06-08 DIAGNOSIS — I5032 Chronic diastolic (congestive) heart failure: Secondary | ICD-10-CM | POA: Diagnosis present

## 2024-06-08 DIAGNOSIS — Z933 Colostomy status: Secondary | ICD-10-CM | POA: Diagnosis not present

## 2024-06-08 DIAGNOSIS — Z7901 Long term (current) use of anticoagulants: Secondary | ICD-10-CM | POA: Diagnosis not present

## 2024-06-08 DIAGNOSIS — G894 Chronic pain syndrome: Secondary | ICD-10-CM | POA: Diagnosis present

## 2024-06-08 DIAGNOSIS — E66812 Obesity, class 2: Secondary | ICD-10-CM | POA: Diagnosis present

## 2024-06-08 DIAGNOSIS — Z950 Presence of cardiac pacemaker: Secondary | ICD-10-CM | POA: Diagnosis not present

## 2024-06-08 DIAGNOSIS — Z800 Family history of malignant neoplasm of digestive organs: Secondary | ICD-10-CM | POA: Diagnosis not present

## 2024-06-08 DIAGNOSIS — J439 Emphysema, unspecified: Secondary | ICD-10-CM | POA: Diagnosis present

## 2024-06-08 DIAGNOSIS — J189 Pneumonia, unspecified organism: Secondary | ICD-10-CM | POA: Diagnosis present

## 2024-06-08 DIAGNOSIS — Z87891 Personal history of nicotine dependence: Secondary | ICD-10-CM | POA: Diagnosis not present

## 2024-06-08 DIAGNOSIS — Z803 Family history of malignant neoplasm of breast: Secondary | ICD-10-CM | POA: Diagnosis not present

## 2024-06-08 DIAGNOSIS — I428 Other cardiomyopathies: Secondary | ICD-10-CM | POA: Diagnosis present

## 2024-06-08 DIAGNOSIS — Z90710 Acquired absence of both cervix and uterus: Secondary | ICD-10-CM | POA: Diagnosis not present

## 2024-06-08 DIAGNOSIS — J9611 Chronic respiratory failure with hypoxia: Secondary | ICD-10-CM | POA: Diagnosis present

## 2024-06-08 DIAGNOSIS — J440 Chronic obstructive pulmonary disease with acute lower respiratory infection: Secondary | ICD-10-CM | POA: Diagnosis present

## 2024-06-08 DIAGNOSIS — I4811 Longstanding persistent atrial fibrillation: Secondary | ICD-10-CM | POA: Diagnosis present

## 2024-06-08 DIAGNOSIS — Z6836 Body mass index (BMI) 36.0-36.9, adult: Secondary | ICD-10-CM | POA: Diagnosis not present

## 2024-06-08 DIAGNOSIS — Z9981 Dependence on supplemental oxygen: Secondary | ICD-10-CM | POA: Diagnosis not present

## 2024-06-08 DIAGNOSIS — Z79891 Long term (current) use of opiate analgesic: Secondary | ICD-10-CM | POA: Diagnosis not present

## 2024-06-08 DIAGNOSIS — E440 Moderate protein-calorie malnutrition: Secondary | ICD-10-CM | POA: Diagnosis present

## 2024-06-08 DIAGNOSIS — J441 Chronic obstructive pulmonary disease with (acute) exacerbation: Secondary | ICD-10-CM | POA: Diagnosis present

## 2024-06-08 LAB — CBC WITH DIFFERENTIAL/PLATELET
Abs Immature Granulocytes: 0.03 K/uL (ref 0.00–0.07)
Basophils Absolute: 0 K/uL (ref 0.0–0.1)
Basophils Relative: 0 %
Eosinophils Absolute: 0 K/uL (ref 0.0–0.5)
Eosinophils Relative: 0 %
HCT: 29.5 % — ABNORMAL LOW (ref 36.0–46.0)
Hemoglobin: 9.2 g/dL — ABNORMAL LOW (ref 12.0–15.0)
Immature Granulocytes: 1 %
Lymphocytes Relative: 21 %
Lymphs Abs: 1 K/uL (ref 0.7–4.0)
MCH: 27.7 pg (ref 26.0–34.0)
MCHC: 31.2 g/dL (ref 30.0–36.0)
MCV: 88.9 fL (ref 80.0–100.0)
Monocytes Absolute: 0.6 K/uL (ref 0.1–1.0)
Monocytes Relative: 14 %
Neutro Abs: 3 K/uL (ref 1.7–7.7)
Neutrophils Relative %: 64 %
Platelets: 135 K/uL — ABNORMAL LOW (ref 150–400)
RBC: 3.32 MIL/uL — ABNORMAL LOW (ref 3.87–5.11)
RDW: 14.7 % (ref 11.5–15.5)
WBC: 4.7 K/uL (ref 4.0–10.5)
nRBC: 0 % (ref 0.0–0.2)

## 2024-06-08 LAB — HEMOGLOBIN AND HEMATOCRIT, BLOOD
HCT: 41.9 % (ref 36.0–46.0)
Hemoglobin: 13.5 g/dL (ref 12.0–15.0)

## 2024-06-08 LAB — BASIC METABOLIC PANEL WITH GFR
Anion gap: 8 (ref 5–15)
BUN: 26 mg/dL — ABNORMAL HIGH (ref 8–23)
CO2: 20 mmol/L — ABNORMAL LOW (ref 22–32)
Calcium: 8.5 mg/dL — ABNORMAL LOW (ref 8.9–10.3)
Chloride: 109 mmol/L (ref 98–111)
Creatinine, Ser: 1.05 mg/dL — ABNORMAL HIGH (ref 0.44–1.00)
GFR, Estimated: 54 mL/min — ABNORMAL LOW
Glucose, Bld: 106 mg/dL — ABNORMAL HIGH (ref 70–99)
Potassium: 3.5 mmol/L (ref 3.5–5.1)
Sodium: 138 mmol/L (ref 135–145)

## 2024-06-08 MED ORDER — POTASSIUM CHLORIDE CRYS ER 20 MEQ PO TBCR
20.0000 meq | EXTENDED_RELEASE_TABLET | ORAL | Status: AC
  Administered 2024-06-08 (×2): 20 meq via ORAL
  Filled 2024-06-08 (×2): qty 1

## 2024-06-08 MED ORDER — PREDNISONE 50 MG PO TABS
50.0000 mg | ORAL_TABLET | Freq: Every day | ORAL | Status: DC
  Administered 2024-06-09 – 2024-06-11 (×3): 50 mg via ORAL
  Filled 2024-06-08 (×4): qty 1

## 2024-06-08 NOTE — Evaluation (Signed)
 Physical Therapy Evaluation Patient Details Name: Kathleen [name] MRN: [id number] DOB: Mar 04, 1946 Today's Date: 06/08/2024  History of Present Illness  79 yo F adm 12/31 with community acquired pneumonia. She had a recent admission for flu A with acute on chronic hypoxic RF. 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  Clinical Impression  Pt with recent hospital admission due to flu, returns for community acquired pneumonia. Prior to illness, she was IND with mobility and uses rollator for community distances. She now has limited strength and activity tolerance, and voices concerns for being home alone with her low energy and strength levels. She has intermittent suipport from family but can not stay with them. PT recommends <3hrs/day therapy at follow up to improve her overall health, strength and energy levels before returning home.         If plan is discharge home, recommend the following: A little help with walking and/or transfers;A little help with bathing/dressing/bathroom;Assistance with cooking/housework   Can travel by private vehicle   Yes    Equipment Recommendations None recommended by PT  Recommendations for Other Services       Functional Status Assessment Patient has had a recent decline in their functional status and demonstrates the ability to make significant improvements in function in a reasonable and predictable amount of time.     Precautions / Restrictions Precautions Precautions: None Precaution/Restrictions Comments: monitor O2-O2 dep 4L at baseline Restrictions Weight Bearing Restrictions Per Provider Order: No Other Position/Activity Restrictions: uses 4L O2 around the clock at baseline      Mobility  Bed Mobility Overal bed mobility: Needs Assistance Bed Mobility: Supine to Sit     Supine to sit: Supervision, HOB elevated, Used rails     General bed mobility comments:  increased time secondary to fatigue    Transfers Overall transfer level: Needs assistance Equipment used: None Transfers: Sit to/from Stand, Bed to chair/wheelchair/BSC Sit to Stand: Contact guard assist Stand pivot transfers: Contact guard assist         General transfer comment: CGA no device, increased time secondary to decreased energy and generalized weakness    Ambulation/Gait Ambulation/Gait assistance: Contact guard assist Gait Distance (Feet): 10 Feet Assistive device: None   Gait velocity: decreased     General Gait Details: slow gait in room without device, no dizziness or SOB reported. 4L O2 with sats >92%, limited activity tolerance, declined further ambulation distance  Stairs            Wheelchair Mobility     Tilt Bed    Modified Rankin (Stroke Patients Only)       Balance Overall balance assessment: Needs assistance Sitting-balance support: Feet supported Sitting balance-Leahy Scale: Good     Standing balance support: During functional activity, Single extremity supported Standing balance-Leahy Scale: Fair                               Pertinent Vitals/Pain Pain Assessment Pain Assessment: 0-10 Pain Score: 6  Pain Location: R hip, back, shoulders Pain Descriptors / Indicators: Constant, Aching, Sore, Discomfort Pain Intervention(s): Limited activity within patient's tolerance, Premedicated before session, Monitored during session, Repositioned    Home Living Family/patient expects to be discharged to:: Private residence Living Arrangements: Alone Available Help at Discharge: Available PRN/intermittently Type of Home: Independent living facility [facility]) Home Access: Level entry       Home Layout:  One level Home Equipment: Cane - single Librarian, Academic (2 wheels);Rollator (4 wheels);Shower seat Additional Comments: 4L O2 baseline    Prior Function Prior Level of Function : Independent/Modified Independent              Mobility Comments: tries to ambulate in apartment without a device, uses rollator for community distances ADLs Comments: Modified independent to independent with ADLs and cooking. She does not drive and the staff at the ILF clean.     Extremity/Trunk Assessment   Upper Extremity Assessment Upper Extremity Assessment: RUE deficits/detail;Right hand dominant;Generalized weakness RUE Deficits / Details: chronic R shoulder ROM limitations (<90deg), elbow and hand WFL, MMT WFL LUE Deficits / Details: AROM WFL. Gross strength 4/5 to 4+/5    Lower Extremity Assessment Lower Extremity Assessment: Generalized weakness;RLE deficits/detail RLE Deficits / Details: chronic hip pain, reports her ortho can't do anything to help the pain RLE Sensation: WNL    Cervical / Trunk Assessment Cervical / Trunk Assessment: Normal  Communication   Communication Communication: No apparent difficulties    Cognition Arousal: Alert Behavior During Therapy: WFL for tasks assessed/performed   PT - Cognitive impairments: No apparent impairments                                 Cueing Cueing Techniques: Verbal cues     General Comments      Exercises     Assessment/Plan    PT Assessment Patient needs continued PT services  PT Problem List Decreased strength;Decreased range of motion;Decreased activity tolerance;Decreased balance;Decreased mobility       PT Treatment Interventions Gait training;Functional mobility training;Therapeutic activities;Therapeutic exercise;Patient/family education;Balance training    PT Goals (Current goals can be found in the Care Plan section)  Acute Rehab PT Goals Patient Stated Goal: to feel stronger and get home safely PT Goal Formulation: With patient Time For Goal Achievement: 06/22/24 Potential to Achieve Goals: Good    Frequency Min 3X/week     Co-evaluation               AM-PAC PT 6 Clicks Mobility  Outcome  Measure Help needed turning from your back to your side while in a flat bed without using bedrails?: None Help needed moving from lying on your back to sitting on the side of a flat bed without using bedrails?: None Help needed moving to and from a bed to a chair (including a wheelchair)?: A Little Help needed standing up from a chair using your arms (e.g., wheelchair or bedside chair)?: A Little Help needed to walk in hospital room?: A Little Help needed climbing 3-5 steps with a railing? : A Little 6 Click Score: 20    End of Session Equipment Utilized During Treatment: Oxygen  Activity Tolerance: Patient tolerated treatment well Patient left: in chair;with call bell/phone within reach Nurse Communication: Mobility status;Other (comment) (IV pain) PT Visit Diagnosis: Muscle weakness (generalized) (M62.81)    Time: 9082-9048 PT Time Calculation (min) (ACUTE ONLY): 34 min   Charges:   PT Evaluation $PT Eval Low Complexity: 1 Low PT Treatments $Therapeutic Activity: 8-22 mins           Isaiah DEL. Quinnley Colasurdo, PT, DPT   [employer] 06/08/2024, 10:00 AM

## 2024-06-08 NOTE — Progress Notes (Signed)
 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  Since last few days started having more cough and rhonchorous wheezing so came back to the emergency room. In the emergency room normotensive, normal temperature, blood pressure stable.  On 5 L oxygen .  Chest x-ray with possible retrocardiac opacity.  Clinically diffuse wheezing.  Patient was given DuoNebs, Solu-Medrol , IV Rocephin  and azithromycin  and admitted to the hospital due to significant symptoms.  Subjective: Patient seen and examined.  She has dry cough, very difficult to take deep breaths without inducing cough, wheezing.  Remains afebrile overnight.  On my exam she was on 4 L of oxygen  that she uses at home.  Complains of being very weak. Lab error in the morning-repeat hemoglobin is stable.  Assessment & Plan:   Postviral pneumonia, COPD exacerbation secondary to postviral pneumonia and recent influenza A: Chronic hypoxemia on 4 L oxygen . Agree with admission to monitored unit because of severity of symptoms. Aggressive bronchodilator therapy, IV steroids to oral steroids, Breo, Yupelri , scheduled and as needed bronchodilators, deep breathing exercises, incentive spirometry, chest physiotherapy . Mobility with PT OT. Rocephin   and doxycycline . Blood cultures drawn, negative so far. MRSA swab negative. Supplemental oxygen  to keep saturations more than 92%.   Persistent A-fib: Therapeutic on Eliquis .  Patient on Tikosyn .  Additional potassium today.  Hypertension: Blood pressure stable on  spironolactone  and torsemide .  DVT prophylaxis: Place TED hose Start: 06/07/24 1440 apixaban  (ELIQUIS ) tablet 5 mg   Code Status: Full code Family Communication: None at the bedside Disposition Plan: Status is: Inpatient Remains inpatient appropriate because: Significant shortness of breath, wheezing, IV antibiotics needed     Consultants:  None  Procedures:  None  Antimicrobials:  Rocephin  and doxycycline  12/31--     Objective: Vitals:   06/08/24 0832 06/08/24 1104 06/08/24 1335 06/08/24 1406  BP:  (!) 130/57 (!) 159/75   Pulse:  70 83   Resp:  20 18   Temp:  (!) 97.5 F (36.4 C) (!) 97.5 F (36.4 C)   TempSrc:  Oral Oral   SpO2: 99%  96% 96%  Weight:      Height:        Intake/Output Summary (Last 24 hours) at 06/08/2024 1416 Last data filed at 06/08/2024 1331 Gross per 24 hour  Intake 1332.56 ml  Output 300 ml  Net 1032.56 ml   Filed Weights   06/07/24 1035  Weight: 85.6 kg    Examination:  General exam: Appears anxious.  Audibly wheezing. Respiratory system: Extensive inspiratory and expiratory wheezes.  On 4 L oxygen .  Audible wheezing present.  No crackles. Cardiovascular system: S1 & S2 heard, irregular irregular.  No edema.   Gastrointestinal system: Abdomen is nondistended, soft and nontender. No organomegaly or masses felt. Normal bowel sounds heard. Central nervous system: Alert and oriented. No focal neurological deficits.    Data Reviewed: I have personally reviewed following labs and imaging studies  CBC: Recent Labs  Lab 06/02/24 0334 06/07/24 1102 06/08/24 0608 06/08/24 0830  WBC 6.1 12.8* 4.7  --   NEUTROABS  --  10.5* 3.0  --  HGB 11.7* 14.8 9.2* 13.5  HCT 36.5 44.7 29.5* 41.9  MCV 85.3 83.1 88.9  --   PLT 159 253 135*  --    Basic Metabolic Panel: Recent Labs  Lab 06/02/24 0334 06/07/24 1102 06/08/24 0608  NA 140 140 138  K 4.4 3.7 3.5  CL 104 97* 109  CO2 25 26 20*  GLUCOSE 184* 140* 106*  BUN 16 21 26*   CREATININE 0.60 0.85 1.05*  CALCIUM  9.8 9.7 8.5*  MG 2.3  --   --   PHOS 2.7  --   --    GFR: Estimated Creatinine Clearance: 43.6 mL/min (A) (by C-G formula based on SCr of 1.05 mg/dL (H)). Liver Function Tests: Recent Labs  Lab 06/02/24 0334 06/07/24 1102  AST  --  16  ALT  --  14  ALKPHOS  --  54  BILITOT  --  0.7  PROT  --  7.2  ALBUMIN  3.7 4.6   No results for input(s): LIPASE, AMYLASE in the last 168 hours. No results for input(s): AMMONIA in the last 168 hours. Coagulation Profile: Recent Labs  Lab 06/07/24 1102  INR 1.0   Cardiac Enzymes: No results for input(s): CKTOTAL, CKMB, CKMBINDEX, TROPONINI in the last 168 hours. BNP (last 3 results) No results for input(s): PROBNP in the last 8760 hours. HbA1C: No results for input(s): HGBA1C in the last 72 hours. CBG: No results for input(s): GLUCAP in the last 168 hours. Lipid Profile: No results for input(s): CHOL, HDL, LDLCALC, TRIG, CHOLHDL, LDLDIRECT in the last 72 hours. Thyroid  Function Tests: No results for input(s): TSH, T4TOTAL, FREET4, T3FREE, THYROIDAB in the last 72 hours. Anemia Panel: No results for input(s): VITAMINB12, FOLATE, FERRITIN, TIBC, IRON , RETICCTPCT in the last 72 hours. Sepsis Labs: Recent Labs  Lab 06/07/24 1102 06/07/24 1255  LATICACIDVEN 2.2* 1.4    Recent Results (from the past 240 hours)  Blood Culture (routine x 2)     Status: None (Preliminary result)   Collection Time: 06/07/24 11:02 AM   Specimen: BLOOD  Result Value Ref Range Status   Specimen Description   Final    BLOOD LEFT ANTECUBITAL Performed at [facility], [address] [place], [place] [address]    Special Requests   Final    BOTTLES DRAWN AEROBIC AND ANAEROBIC Blood Culture results may not be optimal due to an inadequate volume of blood received in culture bottles Performed at [facility], [address] [place], [place]  [address]    Culture   Final    NO GROWTH < 24 HOURS Performed at [facility], [address] [place], [place] [address]    Report Status PENDING  Incomplete         Radiology Studies: DG Chest Port 1 View Result Date: 06/07/2024 EXAM: 1 VIEW(S) XRAY OF THE CHEST 06/07/2024 11:22:00 AM COMPARISON: 05/29/2024 CLINICAL HISTORY: Questionable sepsis - evaluate for abnormality FINDINGS: LINES, TUBES AND DEVICES: 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 LUNGS AND PLEURA: Patient is rotated. Hyperinflation of the lungs. Chronic coarse and interstitial markings. Increase of retrocardiac airspace opacity. Left costophrenic angle blunting with possible trace left pleural effusion. No pneumothorax. HEART AND MEDIASTINUM: Unchanged cardiomediastinal silhouette. Atherosclerotic plaque. BONES AND SOFT TISSUES: Degenerative changes in both shoulders. No acute osseous abnormality. IMPRESSION: 1. Increased retrocardiac airspace opacity, concerning for pneumonia. Left costophrenic angle blunting with possible trace left pleural effusion.Follow-up PA and lateral chest X-ray is recommended  in 3-4 weeks following therapy to ensure resolution . Electronically signed by: Morgane Naveau MD 06/07/2024 12:27 PM EST RP Workstation: [id number]        Scheduled Meds:  apixaban   5 mg Oral BID   cholecalciferol   2,000 Units Oral QHS   cyanocobalamin   1,000 mcg Oral Daily   docusate sodium   100 mg Oral BID   dofetilide   250 mcg Oral BID   febuxostat   40 mg Oral Daily   ferrous gluconate   324 mg Oral Q breakfast   fluticasone  furoate-vilanterol  1 puff Inhalation Daily   gabapentin   300 mg Oral QHS   ipratropium-albuterol   3 mL Nebulization TID   lidocaine   1-2 patch Transdermal Q24H   magnesium  oxide  400 mg Oral QPM   potassium chloride   20 mEq Oral Q2H   [START ON 06/09/2024] predniSONE   50 mg Oral Q breakfast   revefenacin   175 mcg Nebulization Daily   spironolactone   25 mg Oral QPM    torsemide   40 mg Oral Daily   Continuous Infusions:  cefTRIAXone  (ROCEPHIN )  IV 2 g (06/08/24 1331)   doxycycline  (VIBRAMYCIN ) IV 100 mg (06/08/24 0905)   promethazine  (PHENERGAN ) injection (IM or IVPB) Stopped (06/07/24 1525)     LOS: 0 days    Time spent: 52 minutes    Renato Applebaum, MD [facility]   "

## 2024-06-09 ENCOUNTER — Telehealth: Payer: Self-pay

## 2024-06-09 DIAGNOSIS — J9611 Chronic respiratory failure with hypoxia: Secondary | ICD-10-CM | POA: Diagnosis not present

## 2024-06-09 DIAGNOSIS — J439 Emphysema, unspecified: Secondary | ICD-10-CM | POA: Diagnosis not present

## 2024-06-09 DIAGNOSIS — J189 Pneumonia, unspecified organism: Secondary | ICD-10-CM | POA: Diagnosis not present

## 2024-06-09 MED ORDER — BENZONATATE 100 MG PO CAPS: 100.0000 mg | ORAL_CAPSULE | Freq: Three times a day (TID) | ORAL | Status: DC

## 2024-06-09 MED ORDER — HYDROCOD POLI-CHLORPHE POLI ER 10-8 MG/5ML PO SUER
5.0000 mL | Freq: Two times a day (BID) | ORAL | Status: DC | PRN
  Administered 2024-06-10 – 2024-06-12 (×4): 5 mL via ORAL
  Filled 2024-06-09 (×4): qty 5

## 2024-06-09 NOTE — Telephone Encounter (Signed)
 Can I give the orders ?

## 2024-06-09 NOTE — Plan of Care (Signed)

## 2024-06-09 NOTE — Telephone Encounter (Signed)
 Copied from CRM #[id number]. Topic: Clinical - Home Health Verbal Orders >> Jun 07, 2024  2:14 PM Rea BROCKS wrote: Caller/Agency: [facility]  Callback Number: [phone] Service Requested: Physical Therapy Frequency: 1 week 1  2 week 2  1 week 3  Any new concerns about the patient?

## 2024-06-09 NOTE — Progress Notes (Signed)
 " PROGRESS NOTE    JUNO ALERS  FMW:[id number] DOB: [birth year] DOA: 06/07/2024 PCP: Joshua Debby CROME, MD    Brief Narrative:  79 year old with history of COPD and chronic hypoxemic failure on 4 L nasal cannula oxygen , A-fib on Eliquis  and Tikosyn , heart failure with preserved ejection fraction, recurrent diverticulitis status post colostomy who was recently admitted to the hospital with influenza A infection and COPD exacerbation 12/22-12/26 and went home, was initially better.  Since last few days started having more cough and rhonchorous wheezing so came back to the emergency room. In the emergency room normotensive, normal temperature, blood pressure stable.  On 5 L oxygen .  Chest x-ray with possible retrocardiac opacity.  Clinically diffuse wheezing.  Patient was given DuoNebs, Solu-Medrol , IV Rocephin  and azithromycin  and admitted to the hospital due to significant symptoms.  Subjective: Patient was seen and examined.  Still has significant shortness of breath and wheezing.  She was having a hard time catching up her breath after coming back from bathroom.  No other overnight events.  She is agreed to refer her to skilled nursing facility for rehab. Patient has some upper abdominal pain worse with coughing.  Assessment & Plan:   Postviral pneumonia, COPD exacerbation secondary to postviral pneumonia and recent influenza A: Chronic hypoxemia on 4 L oxygen . Continue monitoring in the hospital due to significant symptoms. Aggressive bronchodilator therapy, IV steroids to oral steroids, Breo, Yupelri , scheduled and as needed bronchodilators, deep breathing exercises, incentive spirometry, chest physiotherapy . Mobility with PT OT. Rocephin   and doxycycline . Blood cultures drawn, negative so far. MRSA swab negative. Supplemental oxygen  to keep saturations more than 92%.  Persistent A-fib: Therapeutic on Eliquis .  Patient on Tikosyn .  Electrolytes are adequate.  On potassium  supplements.  Hypertension: Blood pressure stable on spironolactone  and torsemide .  DVT prophylaxis: Place TED hose Start: 06/07/24 1440 apixaban  (ELIQUIS ) tablet 5 mg   Code Status: Full code Family Communication: None at the bedside Disposition Plan: Status is: Inpatient Remains inpatient appropriate because: Significant shortness of breath, wheezing, IV antibiotics needed     Consultants:  None  Procedures:  None  Antimicrobials:  Rocephin  and doxycycline  12/31--     Objective: Vitals:   06/09/24 0549 06/09/24 0834 06/09/24 0836 06/09/24 0837  BP: 131/61     Pulse: 72     Resp: 16     Temp: 98.3 F (36.8 C)     TempSrc: Oral     SpO2:  96% 97% 97%  Weight:      Height:        Intake/Output Summary (Last 24 hours) at 06/09/2024 1305 Last data filed at 06/08/2024 1500 Gross per 24 hour  Intake 404.89 ml  Output 300 ml  Net 104.89 ml   Filed Weights   06/07/24 1035  Weight: 85.6 kg    Examination:  General exam: Slightly anxious and in moderate distress with mobility. Respiratory system: Extensive expiratory wheezes and bronchial sounds.  On 3 L oxygen .  In moderate respiratory distress with mobility. Cardiovascular system: S1 & S2 heard, irregular irregular.  No edema.   Gastrointestinal system: Abdomen is nondistended, soft and nontender. No organomegaly or masses felt. Normal bowel sounds heard. Patient has a colostomy with adequate stool.  Bowel sounds present. Central nervous system: Alert and oriented. No focal neurological deficits.    Data Reviewed: I have personally reviewed following labs and imaging studies  CBC: Recent Labs  Lab 06/07/24 1102 06/08/24 0608 06/08/24 0830  WBC 12.8* 4.7  --  NEUTROABS 10.5* 3.0  --   HGB 14.8 9.2* 13.5  HCT 44.7 29.5* 41.9  MCV 83.1 88.9  --   PLT 253 135*  --    Basic Metabolic Panel: Recent Labs  Lab 06/07/24 1102 06/08/24 0608  NA 140 138  K 3.7 3.5  CL 97* 109  CO2 26 20*  GLUCOSE 140*  106*  BUN 21 26*  CREATININE 0.85 1.05*  CALCIUM  9.7 8.5*   GFR: Estimated Creatinine Clearance: 43.6 mL/min (A) (by C-G formula based on SCr of 1.05 mg/dL (H)). Liver Function Tests: Recent Labs  Lab 06/07/24 1102  AST 16  ALT 14  ALKPHOS 54  BILITOT 0.7  PROT 7.2  ALBUMIN  4.6   No results for input(s): LIPASE, AMYLASE in the last 168 hours. No results for input(s): AMMONIA in the last 168 hours. Coagulation Profile: Recent Labs  Lab 06/07/24 1102  INR 1.0   Cardiac Enzymes: No results for input(s): CKTOTAL, CKMB, CKMBINDEX, TROPONINI in the last 168 hours. BNP (last 3 results) No results for input(s): PROBNP in the last 8760 hours. HbA1C: No results for input(s): HGBA1C in the last 72 hours. CBG: No results for input(s): GLUCAP in the last 168 hours. Lipid Profile: No results for input(s): CHOL, HDL, LDLCALC, TRIG, CHOLHDL, LDLDIRECT in the last 72 hours. Thyroid  Function Tests: No results for input(s): TSH, T4TOTAL, FREET4, T3FREE, THYROIDAB in the last 72 hours. Anemia Panel: No results for input(s): VITAMINB12, FOLATE, FERRITIN, TIBC, IRON , RETICCTPCT in the last 72 hours. Sepsis Labs: Recent Labs  Lab 06/07/24 1102 06/07/24 1255  LATICACIDVEN 2.2* 1.4    Recent Results (from the past 240 hours)  Blood Culture (routine x 2)     Status: None (Preliminary result)   Collection Time: 06/07/24 11:02 AM   Specimen: BLOOD  Result Value Ref Range Status   Specimen Description   Final    BLOOD LEFT ANTECUBITAL Performed at [facility], [address] [place], [place] [address]    Special Requests   Final    BOTTLES DRAWN AEROBIC AND ANAEROBIC Blood Culture results may not be optimal due to an inadequate volume of blood received in culture bottles Performed at [facility], [address] [place], [place] [address]    Culture   Final    NO GROWTH 2 DAYS Performed at [facility], [address] [place], [place] [address]    Report Status PENDING  Incomplete  Blood Culture (routine x 2)     Status: None (Preliminary result)   Collection Time: 06/07/24 11:40 AM   Specimen: Right Antecubital; Blood  Result Value Ref Range Status   Specimen Description   Final    RIGHT ANTECUBITAL Performed at [facility], [address] [place], [place] [address]    Special Requests   Final    BOTTLES DRAWN AEROBIC AND ANAEROBIC Blood Culture results may not be optimal due to an inadequate volume of blood received in culture bottles Performed at [facility], [address] [place], [place] [address]    Culture   Final    NO GROWTH < 24 HOURS Performed at [facility], [address], [place], [place] [address]    Report Status PENDING  Incomplete         Radiology Studies: No results found.       Scheduled Meds:  apixaban   5 mg Oral BID   cholecalciferol   2,000 Units Oral QHS   cyanocobalamin   1,000 mcg Oral Daily   docusate sodium   100 mg Oral BID   dofetilide   250 mcg Oral BID   febuxostat   40 mg Oral Daily   ferrous gluconate   324 mg Oral Q breakfast   fluticasone  furoate-vilanterol  1 puff Inhalation Daily   gabapentin   300 mg Oral QHS   ipratropium-albuterol   3 mL Nebulization TID   lidocaine   1-2 patch Transdermal Q24H   magnesium  oxide  400 mg Oral QPM   predniSONE   50 mg Oral Q breakfast   revefenacin   175 mcg Nebulization Daily   spironolactone   25 mg Oral QPM   torsemide   40 mg Oral Daily   Continuous Infusions:  cefTRIAXone  (ROCEPHIN )  IV 2 g (06/08/24 1331)   doxycycline  (VIBRAMYCIN ) IV 100 mg (06/09/24 0917)   promethazine  (PHENERGAN ) injection (IM or IVPB) Stopped (06/07/24 1525)     LOS: 1 day      Renato Applebaum, MD [facility]   "

## 2024-06-09 NOTE — Plan of Care (Signed)
" °  Problem: Health Behavior/Discharge Planning: Goal: Ability to manage health-related needs will improve Outcome: Progressing   Problem: Education: Goal: Knowledge of General Education information will improve Description: Including pain rating scale, medication(s)/side effects and non-pharmacologic comfort measures Outcome: Progressing   Problem: Clinical Measurements: Goal: Ability to maintain clinical measurements within normal limits will improve Outcome: Progressing Goal: Will remain free from infection Outcome: Progressing Goal: Diagnostic test results will improve Outcome: Progressing Goal: Respiratory complications will improve Outcome: Progressing   Problem: Clinical Measurements: Goal: Ability to maintain clinical measurements within normal limits will improve Outcome: Progressing Goal: Will remain free from infection Outcome: Progressing Goal: Diagnostic test results will improve Outcome: Progressing Goal: Respiratory complications will improve Outcome: Progressing   Problem: Nutrition: Goal: Adequate nutrition will be maintained Outcome: Progressing   Problem: Coping: Goal: Level of anxiety will decrease Outcome: Progressing   Problem: Pain Managment: Goal: General experience of comfort will improve and/or be controlled Outcome: Progressing   "

## 2024-06-09 NOTE — Evaluation (Signed)
 Occupational Therapy Evaluation Patient Details Name: Kathleen [name] MRN: [id number] DOB: [birth year] Today's Date: 06/09/2024   History of Present Illness   79 yo F adm 12/31 with community acquired pneumonia. She had a recent admission for flu A with acute on chronic hypoxic RF. 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     Clinical Impressions Pt admitted with above. Prior to admission, pt lives alone in ILF and able to perform ADLs and light cooking mod independent, no AD inside her apartment but uses 4WW for community mobility on 4L home O2. Pt presents with decreased activity tolerance, generalized weakness, decreased cardiopulmonary status. Able to perform bed mobility with increased time/effort supervision level, MIN A for SPT from bed > BSC > recliner, and up to MIN A steadying assist for clothing mgmt + hygiene after continent void.Pt would benefit from skilled OT services to address noted impairments and functional limitations (see below for any additional details) in order to maximize safety and independence while minimizing falls risk and caregiver burden. Anticipate the need for follow up OT services upon acute hospital DC as pt is not at her functional baseline. Patient will benefit from continued inpatient follow up therapy, <3 hours/day.      If plan is discharge home, recommend the following:   A little help with walking and/or transfers;A little help with bathing/dressing/bathroom;Assist for transportation;Help with stairs or ramp for entrance     Functional Status Assessment   Patient has had a recent decline in their functional status and demonstrates the ability to make significant improvements in function in a reasonable and predictable amount of time.     Equipment Recommendations   None recommended by OT      Precautions/Restrictions   Precautions Precautions:  Fall Precaution/Restrictions Comments: monitor O2-O2 dep 4L at baseline Restrictions Weight Bearing Restrictions Per Provider Order: No     Mobility Bed Mobility Overal bed mobility: Needs Assistance Bed Mobility: Supine to Sit     Supine to sit: Supervision, HOB elevated, Used rails     General bed mobility comments: increased time secondary to fatigue    Transfers Overall transfer level: Needs assistance Equipment used: None Transfers: Sit to/from Stand, Bed to chair/wheelchair/BSC Sit to Stand: Min assist     Step pivot transfers: Min assist     General transfer comment: increased time and effort secondary to fatigue and weakness. requires multiple recovery breaks      Balance Overall balance assessment: Needs assistance Sitting-balance support: Feet supported Sitting balance-Leahy Scale: Good     Standing balance support: During functional activity, Single extremity supported Standing balance-Leahy Scale: Fair Standing balance comment: increased postural sway without AD                           ADL either performed or assessed with clinical judgement   ADL Overall ADL's : Needs assistance/impaired Eating/Feeding: Independent;Sitting   Grooming: Sitting;Wash/dry hands;Set up   Upper Body Bathing: Set up;Sitting   Lower Body Bathing: Sitting/lateral leans;Sit to/from stand;Minimal assistance   Upper Body Dressing : Set up;Sitting   Lower Body Dressing: Minimal assistance;Sitting/lateral leans;Sit to/from stand   Toilet Transfer: Minimal assistance;BSC/3in1;Stand-pivot Statistician Details (indicate cue type and reason): bed > BSC > recliner, MIN A for steadying assist, pt fatigues quickly Toileting- Clothing Manipulation and Hygiene: Minimal assistance;Sit to/from stand Toileting - Clothing Manipulation Details (indicate cue type and reason): pt able  to perform clothing mgmt and pericare from standing, MIN A for standing balance, fatigues  quickly with poor tolerance in standing     Functional mobility during ADLs: Minimal assistance General ADL Comments: poor activity tolerance, fatigues quickly. SPT from bed > BSC for continent void > recliner with HHA. Mildly unsteady, MIN A to correct LOB. On 4L O2 via [place], SpO2 93% or above.     Vision Baseline Vision/History: 1 Wears glasses Ability to See in Adequate Light: 0 Adequate Patient Visual Report: No change from baseline              Pertinent Vitals/Pain Pain Assessment Pain Assessment: Faces Faces Pain Scale: Hurts little more Pain Location: stomach Pain Descriptors / Indicators: Constant, Aching, Sore, Discomfort Pain Intervention(s): Limited activity within patient's tolerance, Monitored during session, Repositioned     Extremity/Trunk Assessment Upper Extremity Assessment RUE Deficits / Details: chronic R shoulder ROM limitations (<90deg), elbow and hand WFL, MMT WFL LUE Deficits / Details: AROM WFL. Gross strength 4/5 to 4+/5   Lower Extremity Assessment RLE Deficits / Details: chronic hip pain, reports her ortho can't do anything to help the pain RLE Sensation: WNL   Cervical / Trunk Assessment Cervical / Trunk Assessment: Normal   Communication Communication Communication: No apparent difficulties   Cognition Arousal: Alert Behavior During Therapy: WFL for tasks assessed/performed Cognition: No apparent impairments             OT - Cognition Comments: Oriented x4                 Following commands: Intact       Cueing  General Comments   Cueing Techniques: Verbal cues  Pt with c/o stomach discomfort, MD in room during session and made aware. Edu on IS use throughout the day. SpO2 93% or above on 4L via [place].           Home Living Family/patient expects to be discharged to:: Private residence Living Arrangements: Alone Available Help at Discharge: Available PRN/intermittently Type of Home: Independent living facility  [facility]) Home Access: Level entry     Home Layout: One level     Bathroom Shower/Tub: Producer, Television/film/video: Handicapped height Bathroom Accessibility: Yes   Home Equipment: Cane - single Librarian, Academic (2 wheels);Rollator (4 wheels);Shower seat Adaptive Equipment: Reacher;Long-handled sponge Additional Comments: 4L O2 baseline      Prior Functioning/Environment Prior Level of Function : Independent/Modified Independent             Mobility Comments: 4WW for community distances, no AD inside home ADLs Comments: mod ind with ADL and cooking, household chores are performed by staff at ILF    OT Problem List: Decreased strength;Decreased activity tolerance;Impaired balance (sitting and/or standing);Cardiopulmonary status limiting activity   OT Treatment/Interventions: Self-care/ADL training;Therapeutic exercise;Energy conservation;DME and/or AE instruction;Therapeutic activities;Balance training;Patient/family education      OT Goals(Current goals can be found in the care plan section)   Acute Rehab OT Goals OT Goal Formulation: With patient Time For Goal Achievement: 06/23/24 Potential to Achieve Goals: Good   OT Frequency:  Min 2X/week       AM-PAC OT 6 Clicks Daily Activity     Outcome Measure Help from another person eating meals?: None Help from another person taking care of personal grooming?: A Little Help from another person toileting, which includes using toliet, bedpan, or urinal?: A Little Help from another person bathing (including washing, rinsing, drying)?: A Little Help from another person to  put on and taking off regular upper body clothing?: None Help from another person to put on and taking off regular lower body clothing?: A Little 6 Click Score: 20   End of Session Equipment Utilized During Treatment: Oxygen  Nurse Communication: Mobility status  Activity Tolerance: Patient tolerated treatment well Patient left: in  chair;with call bell/phone within reach;with chair alarm set  OT Visit Diagnosis: Muscle weakness (generalized) (M62.81);Unsteadiness on feet (R26.81)                Time: 8997-8966 OT Time Calculation (min): 31 min Charges:  OT General Charges $OT Visit: 1 Visit OT Evaluation $OT Eval Low Complexity: 1 Low OT Treatments $Self Care/Home Management : 8-22 mins Burnett Lieber L. Barack Nicodemus, OTR/L  06/09/2024, 10:41 AM

## 2024-06-10 DIAGNOSIS — J189 Pneumonia, unspecified organism: Secondary | ICD-10-CM | POA: Diagnosis not present

## 2024-06-10 MED ORDER — MELATONIN 5 MG PO TABS
5.0000 mg | ORAL_TABLET | Freq: Every evening | ORAL | Status: DC | PRN
  Administered 2024-06-10: 5 mg via ORAL
  Filled 2024-06-10: qty 1

## 2024-06-10 MED ORDER — OXYCODONE HCL 5 MG PO TABS
5.0000 mg | ORAL_TABLET | Freq: Three times a day (TID) | ORAL | Status: DC | PRN
  Administered 2024-06-10 (×2): 5 mg via ORAL
  Filled 2024-06-10 (×2): qty 1

## 2024-06-10 MED ORDER — OXYCODONE-ACETAMINOPHEN 5-325 MG PO TABS
1.0000 | ORAL_TABLET | Freq: Three times a day (TID) | ORAL | Status: DC | PRN
  Administered 2024-06-10 – 2024-06-12 (×5): 1 via ORAL
  Filled 2024-06-10 (×5): qty 1

## 2024-06-10 NOTE — Progress Notes (Signed)
 " PROGRESS NOTE    Kathleen [name]  FMW:[id number] DOB: December 18, 1945 DOA: 06/07/2024 PCP: Joshua Debby CROME, MD    Brief Narrative:  79 year old with history of COPD and chronic hypoxemic failure on 4 L nasal cannula oxygen , A-fib on Eliquis  and Tikosyn , heart failure with preserved ejection fraction, recurrent diverticulitis status post colostomy who was recently admitted to the hospital with influenza A infection and COPD exacerbation 12/22-12/26 and went home, was initially better.  Since last few days started having more cough and rhonchorous wheezing so came back to the emergency room. In the emergency room normotensive, normal temperature, blood pressure stable.  On 5 L oxygen .  Chest x-ray with possible retrocardiac opacity.  Clinically diffuse wheezing.  Patient was given DuoNebs, Solu-Medrol , IV Rocephin  and azithromycin  and admitted to the hospital due to significant symptoms.  Subjective:  Patient seen and examined.  still coughing and wheezing however slightly improved than before.  She is agreeable to referral to a SNF however really wants to go home if she can.  Assessment & Plan:   Postviral pneumonia, COPD exacerbation secondary to postviral pneumonia and recent influenza A: Chronic hypoxemia on 4 L oxygen . Continue monitoring in the hospital due to significant symptoms. Continue oral steroids, Breo, Yupelri , scheduled and as needed bronchodilators, deep breathing exercises, incentive spirometry, chest physiotherapy . Mobility with PT OT. Rocephin   and doxycycline .  Will treat with 5 days of antibiotic therapy. Blood cultures drawn, negative so far. MRSA swab negative. Supplemental oxygen  to keep saturations more than 90%.  Continue to work with PT OT.  Persistent A-fib: Therapeutic on Eliquis .  Patient on Tikosyn .  Electrolytes are adequate.  On potassium supplements.  Hypertension: Blood pressure stable on spironolactone  and torsemide .  DVT prophylaxis: Place TED hose  Start: 06/07/24 1440 apixaban  (ELIQUIS ) tablet 5 mg   Code Status: Full code Family Communication: None at the bedside Disposition Plan: Status is: Inpatient Remains inpatient appropriate because: Significant shortness of breath, wheezing, IV antibiotics needed     Consultants:  None  Procedures:  None  Antimicrobials:  Rocephin  and doxycycline  12/31--     Objective: Vitals:   06/09/24 1416 06/09/24 1938 06/09/24 2043 06/10/24 0456  BP:   (!) 143/61 (!) 144/79  Pulse:   70 71  Resp:   (!) 22 20  Temp:   98.1 F (36.7 C) 97.7 F (36.5 C)  TempSrc:   Oral Oral  SpO2: 97% 96% 97% 100%  Weight:      Height:        Intake/Output Summary (Last 24 hours) at 06/10/2024 1122 Last data filed at 06/10/2024 1040 Gross per 24 hour  Intake 200 ml  Output --  Net 200 ml   Filed Weights   06/07/24 1035  Weight: 85.6 kg    Examination:  General: In mild distress with mobility.  Able to talk in complete sentences. Cardiovascular: S1-S2 normal.  Regular rate rhythm. Respiratory: Bilateral upper airway sounds.  Bronchial breath sounds.  Some expiratory wheezes. Gastrointestinal: Soft.  Nontender.  Bowel sound present.  Colostomy with loose stool. Ext: No swelling or edema. Neuro: Alert awake and oriented.  No focal neurological deficits.     Data Reviewed: I have personally reviewed following labs and imaging studies  CBC: Recent Labs  Lab 06/07/24 1102 06/08/24 0608 06/08/24 0830  WBC 12.8* 4.7  --   NEUTROABS 10.5* 3.0  --   HGB 14.8 9.2* 13.5  HCT 44.7 29.5* 41.9  MCV 83.1 88.9  --  PLT 253 135*  --    Basic Metabolic Panel: Recent Labs  Lab 06/07/24 1102 06/08/24 0608  NA 140 138  K 3.7 3.5  CL 97* 109  CO2 26 20*  GLUCOSE 140* 106*  BUN 21 26*  CREATININE 0.85 1.05*  CALCIUM  9.7 8.5*   GFR: Estimated Creatinine Clearance: 43.6 mL/min (A) (by C-G formula based on SCr of 1.05 mg/dL (H)). Liver Function Tests: Recent Labs  Lab 06/07/24 1102   AST 16  ALT 14  ALKPHOS 54  BILITOT 0.7  PROT 7.2  ALBUMIN  4.6   No results for input(s): LIPASE, AMYLASE in the last 168 hours. No results for input(s): AMMONIA in the last 168 hours. Coagulation Profile: Recent Labs  Lab 06/07/24 1102  INR 1.0   Cardiac Enzymes: No results for input(s): CKTOTAL, CKMB, CKMBINDEX, TROPONINI in the last 168 hours. BNP (last 3 results) No results for input(s): PROBNP in the last 8760 hours. HbA1C: No results for input(s): HGBA1C in the last 72 hours. CBG: No results for input(s): GLUCAP in the last 168 hours. Lipid Profile: No results for input(s): CHOL, HDL, LDLCALC, TRIG, CHOLHDL, LDLDIRECT in the last 72 hours. Thyroid  Function Tests: No results for input(s): TSH, T4TOTAL, FREET4, T3FREE, THYROIDAB in the last 72 hours. Anemia Panel: No results for input(s): VITAMINB12, FOLATE, FERRITIN, TIBC, IRON , RETICCTPCT in the last 72 hours. Sepsis Labs: Recent Labs  Lab 06/07/24 1102 06/07/24 1255  LATICACIDVEN 2.2* 1.4    Recent Results (from the past 240 hours)  Blood Culture (routine x 2)     Status: None (Preliminary result)   Collection Time: 06/07/24 11:02 AM   Specimen: BLOOD  Result Value Ref Range Status   Specimen Description   Final    BLOOD LEFT ANTECUBITAL Performed at [facility], [address] [place], [place] [address]    Special Requests   Final    BOTTLES DRAWN AEROBIC AND ANAEROBIC Blood Culture results may not be optimal due to an inadequate volume of blood received in culture bottles Performed at [facility], [address] [place], [place] [address]    Culture   Final    NO GROWTH 3 DAYS Performed at [facility], [address] [place], [place] [address]    Report Status PENDING  Incomplete  Blood Culture (routine x 2)     Status: None (Preliminary result)   Collection Time: 06/07/24 11:40 AM   Specimen: Right Antecubital;  Blood  Result Value Ref Range Status   Specimen Description   Final    RIGHT ANTECUBITAL Performed at [facility], [address] [place], [place] [address]    Special Requests   Final    BOTTLES DRAWN AEROBIC AND ANAEROBIC Blood Culture results may not be optimal due to an inadequate volume of blood received in culture bottles Performed at [facility], [address] [place], [place] [address]    Culture   Final    NO GROWTH 2 DAYS Performed at [facility], [address], [place], [place] [address]    Report Status PENDING  Incomplete         Radiology Studies: No results found.       Scheduled Meds:  apixaban   5 mg Oral BID   cholecalciferol   2,000 Units Oral QHS   cyanocobalamin   1,000 mcg Oral Daily   docusate sodium   100 mg Oral BID   dofetilide   250 mcg Oral BID   febuxostat   40 mg Oral Daily   ferrous gluconate   324 mg Oral Q breakfast   fluticasone  furoate-vilanterol  1 puff Inhalation Daily   gabapentin   300 mg Oral QHS   ipratropium-albuterol   3 mL Nebulization TID   lidocaine   1-2 patch Transdermal Q24H   magnesium  oxide  400 mg Oral QPM   predniSONE   50 mg Oral Q breakfast   revefenacin   175 mcg Nebulization Daily   spironolactone   25 mg Oral QPM   torsemide   40 mg Oral Daily   Continuous Infusions:  cefTRIAXone  (ROCEPHIN )  IV 2 g (06/09/24 1324)   doxycycline  (VIBRAMYCIN ) IV 100 mg (06/10/24 0911)     LOS: 2 days      Renato Applebaum, MD [facility]   "

## 2024-06-10 NOTE — Plan of Care (Signed)

## 2024-06-10 NOTE — Plan of Care (Signed)
" °  Problem: Clinical Measurements: Goal: Respiratory complications will improve Outcome: Not Progressing   Problem: Activity: Goal: Risk for activity intolerance will decrease Outcome: Not Progressing   Problem: Coping: Goal: Level of anxiety will decrease Outcome: Not Progressing   Problem: Pain Managment: Goal: General experience of comfort will improve and/or be controlled Outcome: Not Progressing   "

## 2024-06-11 DIAGNOSIS — J439 Emphysema, unspecified: Secondary | ICD-10-CM | POA: Diagnosis not present

## 2024-06-11 DIAGNOSIS — J189 Pneumonia, unspecified organism: Secondary | ICD-10-CM | POA: Diagnosis not present

## 2024-06-11 DIAGNOSIS — J9611 Chronic respiratory failure with hypoxia: Secondary | ICD-10-CM | POA: Diagnosis not present

## 2024-06-11 LAB — BASIC METABOLIC PANEL WITH GFR
Anion gap: 16 — ABNORMAL HIGH (ref 5–15)
BUN: 27 mg/dL — ABNORMAL HIGH (ref 8–23)
CO2: 28 mmol/L (ref 22–32)
Calcium: 10.5 mg/dL — ABNORMAL HIGH (ref 8.9–10.3)
Chloride: 96 mmol/L — ABNORMAL LOW (ref 98–111)
Creatinine, Ser: 0.94 mg/dL (ref 0.44–1.00)
GFR, Estimated: >60 mL/min
Glucose, Bld: 138 mg/dL — ABNORMAL HIGH (ref 70–99)
Potassium: 3.5 mmol/L (ref 3.5–5.1)
Sodium: 141 mmol/L (ref 135–145)

## 2024-06-11 MED ORDER — POTASSIUM CHLORIDE CRYS ER 20 MEQ PO TBCR
20.0000 meq | EXTENDED_RELEASE_TABLET | ORAL | Status: AC
  Administered 2024-06-11 (×2): 20 meq via ORAL
  Filled 2024-06-11: qty 1

## 2024-06-11 NOTE — TOC Initial Note (Signed)
 Transition of Care [facility]) - Initial/Assessment Note    Patient Details  Name: Kathleen [name] MRN: [id number] Date of Birth: [birth year]  Transition of Care [facility]) CM/SW Contact:    Hoy DELENA Bigness, LCSW Phone Number: 06/11/2024, 3:22 PM  Clinical Narrative:                 Pt from ILF at [facility]. Pt has home O2 provided by [employer]. Pt agreeable to recommendation for STR placement and reports being to several facilities in the past. Referrals have been faxed out and currently awaiting bed offers.   Expected Discharge Plan: Skilled Nursing Facility Barriers to Discharge: Continued Medical Work up, SNF Pending bed offer   Patient Goals and CMS Choice Patient states their goals for this hospitalization and ongoing recovery are:: To feel better          Expected Discharge Plan and Services In-house Referral: Clinical Social Work Discharge Planning Services: NA Post Acute Care Choice: Skilled Nursing Facility Living arrangements for the past 2 months: Independent Living Facility ([facility])                 DME Arranged: N/A DME Agency: NA                  Prior Living Arrangements/Services Living arrangements for the past 2 months: Independent Living Facility ([facility]) Lives with:: Self Patient language and need for interpreter reviewed:: Yes Do you feel safe going back to the place where you live?: Yes      Need for Family Participation in Patient Care: No (Comment) Care giver support system in place?: No (comment) Current home services: DME (Oxygen  w/ [employer]) Criminal Activity/Legal Involvement Pertinent to Current Situation/Hospitalization: No - Comment as needed  Activities of Daily Living   ADL Screening (condition at time of admission) Independently performs ADLs?: Yes (appropriate for developmental age) Is the patient deaf or have difficulty hearing?: No Does the patient have difficulty seeing, even when wearing  glasses/contacts?: No Does the patient have difficulty concentrating, remembering, or making decisions?: No  Permission Sought/Granted Permission sought to share information with : Facility Industrial/product Designer granted to share information with : Yes, Verbal Permission Granted     Permission granted to share info w AGENCY: SNF's        Emotional Assessment Appearance:: Appears stated age Attitude/Demeanor/Rapport: Engaged Affect (typically observed): Accepting Orientation: : Oriented to Self, Oriented to Place, Oriented to  Time, Oriented to Situation Alcohol / Substance Use: Not Applicable Psych Involvement: No (comment)  Admission diagnosis:  Community acquired pneumonia [J18.9] COPD with acute exacerbation (HCC) [J44.1] Community acquired pneumonia, unspecified laterality [J18.9] CAP (community acquired pneumonia) [J18.9] Patient Active Problem List   Diagnosis Date Noted   CAP (community acquired pneumonia) 06/08/2024   Community acquired pneumonia 06/07/2024   Muscular chest pain 06/07/2024   Influenza A 05/29/2024   Vaginal discharge 03/25/2024   Vaginal bleeding 03/25/2024   Ventral hernia without obstruction or gangrene 03/25/2024   Diverticulitis of large intestine with complication 03/25/2024   Pain of left heel 01/25/2024   Long term (current) use of anticoagulants 12/12/2023   BPPV (benign paroxysmal positional vertigo), bilateral 12/06/2023   BPPV (benign paroxysmal positional vertigo) 11/28/2023   UTI (urinary tract infection) 11/26/2023   Dizziness 11/26/2023   S/P AV nodal ablation 11/26/2023   GERD (gastroesophageal reflux disease) 11/26/2023   Colostomy in place [facility]) 11/26/2023   History of diverticulitis 11/26/2023   Tinea corporis 10/21/2023  Surgical wound, non healing 09/10/2023   Colostomy complication (HCC) 08/18/2023   Irritant contact dermatitis associated with fecal stoma 08/13/2023   Colostomy care (HCC) 08/13/2023   Wound  infection 08/04/2023   Dyslipidemia 08/03/2023   Candida esophagitis (HCC) 05/24/2023   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   Pill dysphagia 04/06/2023   COPD exacerbation (HCC) 03/26/2023   Absolute anemia 03/17/2023   Chronic nausea 03/17/2023   Hypokalemia 02/23/2023   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   Carpal tunnel syndrome, right upper limb 01/27/2023   Hypercoagulable state 11/18/2022   COPD with acute exacerbation (HCC) 10/09/2022   Chronic obstructive pulmonary disease (HCC) 06/28/2022   Palliative care encounter 06/28/2022   Anticoagulated by anticoagulation treatment 06/11/2022   Supplemental oxygen  dependent 06/11/2022   Heart failure with reduced ejection fraction (HCC) 05/14/2022   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   Depression, major, single episode, moderate (HCC) 04/27/2022   Obesity (BMI 30-39.9) 03/18/2022   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   Low TSH level 12/02/2021   Acquired hypothyroidism 11/12/2021   Encounter for palliative care involving management of pain 11/12/2021   Chronic left-sided low back pain 11/04/2021   Gout 10/01/2021   Hyperlipidemia with target LDL less than 100 08/07/2021   Cough 08/07/2021   Chronic anticoagulation 07/07/2021   Stage 3a chronic kidney disease (HCC) 05/23/2021   Dietary iron  deficiency 05/23/2021   Age-related osteoporosis without current pathological fracture 04/22/2021   Type 2 diabetes mellitus with diabetic neuropathy, without long-term current use of insulin  (HCC) 04/22/2021   Sleep apnea 01/22/2021   Physical deconditioning 01/16/2020   Statin intolerance 10/12/2019   Statin myopathy 02/24/2019   Chronic combined systolic and diastolic heart failure (HCC) 04/06/2018   HTN (hypertension) 04/06/2018   COPD (chronic obstructive pulmonary disease) (HCC) 04/06/2018   Persistent atrial fibrillation (HCC) 04/06/2018   Chronic respiratory failure with hypoxia (HCC) 03/28/2018    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 [place] [address] Phone: [phone] Fax: [phone]     Social Drivers of Health (SDOH) Social History: SDOH Screenings   Food Insecurity: No Food Insecurity (06/08/2024)  Housing: Low Risk (06/08/2024)  Transportation Needs: No Transportation Needs (06/08/2024)  Utilities: Not At Risk (06/08/2024)  Alcohol Screen: Low Risk (09/15/2023)  Depression (PHQ2-9): Low Risk (06/05/2024)  Financial Resource Strain: Low Risk (09/15/2023)  Physical Activity: Insufficiently Active (09/15/2023)  Social Connections: Moderately Integrated (06/08/2024)  Stress: Stress Concern Present (09/15/2023)  Tobacco Use: Medium Risk (06/07/2024)  Health Literacy: Adequate Health Literacy (09/15/2023)   SDOH Interventions:     Readmission Risk Interventions    06/11/2024    3:19 PM 05/18/2023   12:10 PM 04/26/2023    9:51 AM  Readmission Risk Prevention Plan  Transportation Screening Complete Complete Complete  PCP or Specialist Appt within 3-5 Days Complete  Complete  HRI or Home Care Consult Complete  Complete  Social Work Consult for Recovery Care Planning/Counseling Complete    Palliative Care Screening Not Applicable  --  Medication Review Oceanographer) Complete Complete Complete  PCP or Specialist appointment within 3-5 days of discharge  Complete   HRI or Home Care Consult  Complete   SW Recovery Care/Counseling  Consult  Complete   Palliative Care Screening  Not Applicable   Skilled Nursing Facility  Complete

## 2024-06-11 NOTE — [place] FL2 (Signed)
 " [place]  MEDICAID FL2 LEVEL OF CARE FORM     IDENTIFICATION  Patient Name: Kathleen [name] Birthdate: 07/13/1945 Sex: female Admission Date (Current Location): 06/07/2024  [facility] and [place] Number:  Producer, Television/film/video and Address:  [facility],  [address], [place] [address]      Provider Number: [id number]  Attending Physician Name and Address:  Raenelle Coria, MD  Relative Name and Phone Number:       Current Level of Care: Hospital Recommended Level of Care: Skilled Nursing Facility Prior Approval Number:    Date Approved/Denied:   PASRR Number: [id number]  Discharge Plan: SNF    Current Diagnoses: Patient Active Problem List   Diagnosis Date Noted   CAP (community acquired pneumonia) 06/08/2024   Community acquired pneumonia 06/07/2024   Muscular chest pain 06/07/2024   Influenza A 05/29/2024   Vaginal discharge 03/25/2024   Vaginal bleeding 03/25/2024   Ventral hernia without obstruction or gangrene 03/25/2024   Diverticulitis of large intestine with complication 03/25/2024   Pain of left heel 01/25/2024   Long term (current) use of anticoagulants 12/12/2023   BPPV (benign paroxysmal positional vertigo), bilateral 12/06/2023   BPPV (benign paroxysmal positional vertigo) 11/28/2023   UTI (urinary tract infection) 11/26/2023   Dizziness 11/26/2023   S/P AV nodal ablation 11/26/2023   GERD (gastroesophageal reflux disease) 11/26/2023   Colostomy in place [facility]) 11/26/2023   History of diverticulitis 11/26/2023   Tinea corporis 10/21/2023   Surgical wound, non healing 09/10/2023   Colostomy complication (HCC) 08/18/2023   Irritant contact dermatitis associated with fecal stoma 08/13/2023   Colostomy care (HCC) 08/13/2023   Wound infection 08/04/2023   Dyslipidemia 08/03/2023   Candida esophagitis (HCC) 05/24/2023   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   Pill dysphagia 04/06/2023   COPD  exacerbation (HCC) 03/26/2023   Absolute anemia 03/17/2023   Chronic nausea 03/17/2023   Hypokalemia 02/23/2023   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   Carpal tunnel syndrome, right upper limb 01/27/2023   Hypercoagulable state 11/18/2022   COPD with acute exacerbation (HCC) 10/09/2022   Chronic obstructive pulmonary disease (HCC) 06/28/2022   Palliative care encounter 06/28/2022   Anticoagulated by anticoagulation treatment 06/11/2022   Supplemental oxygen  dependent 06/11/2022   Heart failure with reduced ejection fraction (HCC) 05/14/2022   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   Depression, major, single episode, moderate (HCC) 04/27/2022   Obesity (BMI 30-39.9) 03/18/2022   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   Low TSH level 12/02/2021   Acquired hypothyroidism 11/12/2021   Encounter for palliative care involving management of pain 11/12/2021   Chronic left-sided low back pain 11/04/2021   Gout 10/01/2021   Hyperlipidemia with target LDL less than 100 08/07/2021   Cough 08/07/2021   Chronic anticoagulation 07/07/2021   Stage 3a chronic kidney disease (HCC) 05/23/2021   Dietary iron  deficiency 05/23/2021   Age-related osteoporosis without current pathological fracture 04/22/2021   Type 2 diabetes mellitus with diabetic neuropathy, without long-term current use of insulin  (HCC) 04/22/2021   Sleep apnea 01/22/2021   Physical deconditioning 01/16/2020   Statin intolerance 10/12/2019   Statin myopathy 02/24/2019   Chronic combined systolic and diastolic heart failure (HCC) 04/06/2018   HTN (hypertension) 04/06/2018   COPD (chronic obstructive pulmonary disease) (HCC) 04/06/2018   Persistent atrial fibrillation (HCC) 04/06/2018   Chronic respiratory failure with hypoxia (HCC) 03/28/2018   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 12/02/2015   Protein-calorie malnutrition, moderate 09/11/2015    Orientation RESPIRATION BLADDER  Height & Weight     Self, Time, Situation,  Place  O2 (4L) Continent Weight: 188 lb 11.4 oz (85.6 kg) Height:  5' (152.4 cm)  BEHAVIORAL SYMPTOMS/MOOD NEUROLOGICAL BOWEL NUTRITION STATUS      Colostomy Diet (See DC summary)  AMBULATORY STATUS COMMUNICATION OF NEEDS Skin   Limited Assist Verbally Normal                       Personal Care Assistance Level of Assistance  Bathing, Feeding, Dressing Bathing Assistance: Limited assistance Feeding assistance: Independent Dressing Assistance: Limited assistance     Functional Limitations Info  Sight, Hearing, Speech Sight Info: Adequate (reading glasses) Hearing Info: Adequate Speech Info: Adequate    SPECIAL CARE FACTORS FREQUENCY  PT (By licensed PT), OT (By licensed OT)     PT Frequency: 5x/wk OT Frequency: 5x/wk            Contractures Contractures Info: Not present    Additional Factors Info  Code Status, Allergies Code Status Info: FULL Allergies Info: Allopurinol, Clindamycin, Flublok (Influenza Vaccine Recombinant), Pneumococcal 13-val Conj Vacc, Dronedarone, Montelukast , Brovana  (Arformoterol ), Budesonide , Entresto  (Sacubitril -valsartan ), Fosamax (Alendronate Sodium), Jardiance  (Empagliflozin ), Meperidine, Microplegia Msa-msg (Plegisol), Rosuvastatin, Tetracycline, Adhesive (Tape), Lovastatin           Current Medications (06/11/2024):  This is the current hospital active medication list Current Facility-Administered Medications  Medication Dose Route Frequency Provider Last Rate Last Admin   acetaminophen  (TYLENOL ) tablet 650 mg  650 mg Oral Q6H PRN Cox, Amy N, DO   650 mg at 06/11/24 1130   Or   acetaminophen  (TYLENOL ) suppository 650 mg  650 mg Rectal Q6H PRN Cox, Amy N, DO       apixaban  (ELIQUIS ) tablet 5 mg  5 mg Oral BID Cox, Amy N, DO   5 mg at 06/11/24 9060   cefTRIAXone  (ROCEPHIN ) 2 g in sodium chloride  0.9 % 100 mL IVPB  2 g Intravenous Q24H Cox, Amy N, DO   Stopped at 06/10/24 1300   chlorpheniramine-HYDROcodone  (TUSSIONEX) 10-8 MG/5ML  suspension 5 mL  5 mL Oral Q12H PRN Ghimire, Kuber, MD   5 mL at 06/10/24 2128   cholecalciferol  (VITAMIN D3) 25 MCG (1000 UNIT) tablet 2,000 Units  2,000 Units Oral QHS Cox, Amy N, DO   2,000 Units at 06/10/24 2118   cyanocobalamin  (VITAMIN B12) tablet 1,000 mcg  1,000 mcg Oral Daily Cox, Amy N, DO   1,000 mcg at 06/11/24 9060   docusate sodium  (COLACE) capsule 100 mg  100 mg Oral BID Cox, Amy N, DO   100 mg at 06/11/24 9060   dofetilide  (TIKOSYN ) capsule 250 mcg  250 mcg Oral BID Doretha Folks, MD   250 mcg at 06/11/24 0900   doxycycline  (VIBRAMYCIN ) 100 mg in sodium chloride  0.9 % 250 mL IVPB  100 mg Intravenous Q12H Cox, Amy N, DO 125 mL/hr at 06/11/24 0934 100 mg at 06/11/24 0934   febuxostat  (ULORIC ) tablet 40 mg  40 mg Oral Daily Plunkett, Whitney, MD   40 mg at 06/11/24 9060   ferrous gluconate  (FERGON) tablet 324 mg  324 mg Oral Q breakfast Doretha Folks, MD   324 mg at 06/11/24 0900   fluticasone  furoate-vilanterol (BREO ELLIPTA ) 100-25 MCG/ACT 1 puff  1 puff Inhalation Daily Doretha Folks, MD   1 puff at 06/11/24 0842   gabapentin  (NEURONTIN ) capsule 300 mg  300 mg Oral QHS Plunkett, Whitney, MD   300 mg at 06/10/24 2118   guaiFENesin  (  ROBITUSSIN) 100 MG/5ML liquid 200 mg  200 mg Oral TID PRN Cox, Amy N, DO   200 mg at 06/11/24 1130   hydrALAZINE  (APRESOLINE ) injection 5 mg  5 mg Intravenous Q6H PRN Cox, Amy N, DO       ipratropium-albuterol  (DUONEB) 0.5-2.5 (3) MG/3ML nebulizer solution 3 mL  3 mL Nebulization Q4H PRN Cox, Amy N, DO   3 mL at 06/07/24 1812   magnesium  oxide (MAG-OX) tablet 400 mg  400 mg Oral QPM Cox, Amy N, DO   400 mg at 06/10/24 1702   melatonin tablet 5 mg  5 mg Oral QHS PRN Chavez, Abigail, NP   5 mg at 06/10/24 0028   oxyCODONE -acetaminophen  (PERCOCET/ROXICET) 5-325 MG per tablet 1 tablet  1 tablet Oral Q8H PRN Chavez, Abigail, NP   1 tablet at 06/11/24 9372   And   oxyCODONE  (Oxy IR/ROXICODONE ) immediate release tablet 5 mg  5 mg Oral Q8H PRN Chavez,  Abigail, NP   5 mg at 06/10/24 1600   potassium chloride  SA (KLOR-CON  M) CR tablet 20 mEq  20 mEq Oral Q2H Ghimire, Kuber, MD       predniSONE  (DELTASONE ) tablet 50 mg  50 mg Oral Q breakfast Ghimire, Kuber, MD   50 mg at 06/11/24 9060   revefenacin  (YUPELRI ) nebulizer solution 175 mcg  175 mcg Nebulization Daily Cox, Amy N, DO   175 mcg at 06/11/24 9157   senna-docusate (Senokot-S) tablet 1 tablet  1 tablet Oral QHS PRN Cox, Amy N, DO       spironolactone  (ALDACTONE ) tablet 25 mg  25 mg Oral QPM Plunkett, Whitney, MD   25 mg at 06/10/24 1702   torsemide  (DEMADEX ) tablet 40 mg  40 mg Oral Daily Plunkett, Whitney, MD   40 mg at 06/11/24 9060     Discharge Medications: Please see discharge summary for a list of discharge medications.  Relevant Imaging Results:  Relevant Lab Results:   Additional Information SSN [id number]  Hoy DELENA Bigness, LCSW     "

## 2024-06-11 NOTE — Progress Notes (Signed)
 " PROGRESS NOTE    Kathleen [name]  FMW:[id number] DOB: 03-Oct-1945 DOA: 06/07/2024 PCP: Joshua Debby CROME, MD    Brief Narrative:  79 year old with history of COPD and chronic hypoxemic failure on 4 L nasal cannula oxygen , A-fib on Eliquis  and Tikosyn , heart failure with preserved ejection fraction, recurrent diverticulitis status post colostomy who was recently admitted to the hospital with influenza A infection and COPD exacerbation 12/22-12/26 and went home, was initially better.  Since last few days started having more cough and rhonchorous wheezing so came back to the emergency room. In the emergency room normotensive, normal temperature, blood pressure stable.  On 5 L oxygen .  Chest x-ray with possible retrocardiac opacity.  Clinically diffuse wheezing.  Patient was given DuoNebs, Solu-Medrol , IV Rocephin  and azithromycin  and admitted to the hospital due to significant symptoms.  Subjective:  Patient seen and examined.  He still has significant wheezing, however some improvement of symptoms than before.  Afebrile.  She is very hesitant whether she can go home or has to go to SNF.  Assessment & Plan:   Postviral pneumonia, COPD exacerbation secondary to postviral pneumonia and recent influenza A: Chronic hypoxemia on 4 L oxygen . Continue monitoring in the hospital due to significant symptoms. Continue oral steroids with slow taper, Breo, Yupelri , scheduled and as needed bronchodilators, deep breathing exercises, incentive spirometry, chest physiotherapy . Mobility with PT OT. Rocephin   and doxycycline .  Will treat with 5 days of antibiotic therapy. Blood cultures drawn, negative so far. MRSA swab negative. Supplemental oxygen  to keep saturations more than 90%.  Continue to work with PT OT.  Persistent A-fib: Therapeutic on Eliquis .  Patient on Tikosyn .  Electrolytes are adequate.  On potassium supplements.  Hypertension: Blood pressure stable on spironolactone  and torsemide .  DVT  prophylaxis: Place TED hose Start: 06/07/24 1440 apixaban  (ELIQUIS ) tablet 5 mg   Code Status: Full code Family Communication: None at the bedside Disposition Plan: Status is: Inpatient Remains inpatient appropriate because: Significant shortness of breath, wheezing, IV antibiotics needed     Consultants:  None  Procedures:  None  Antimicrobials:  Rocephin  and doxycycline  12/31--     Objective: Vitals:   06/10/24 2011 06/11/24 0547 06/11/24 0842 06/11/24 1344  BP: (!) 151/74 (!) 159/81  135/63  Pulse: 70 71  71  Resp: 18 18  20   Temp: 98.7 F (37.1 C) 97.7 F (36.5 C)  97.9 F (36.6 C)  TempSrc: Oral Oral  Oral  SpO2: 96% 100% 100% 92%  Weight:      Height:        Intake/Output Summary (Last 24 hours) at 06/11/2024 1433 Last data filed at 06/11/2024 0853 Gross per 24 hour  Intake --  Output 350 ml  Net -350 ml   Filed Weights   06/07/24 1035  Weight: 85.6 kg    Examination:  General: Mildly dyspneic with talking.  Audible wheezing. Cardiovascular: S1-S2 normal.  Regular rate rhythm. Respiratory: Bilateral upper airway sounds.  Bronchial breath sounds.  Some expiratory wheezes. SpO2: 92 % O2 Flow Rate (L/min): (S) 4 L/min (4 lpm dep) FiO2 (%): 36 %  Gastrointestinal: Soft.  Nontender.  Bowel sound present.  Colostomy with loose stool. Ext: No swelling or edema. Neuro: Alert awake and oriented.  No focal neurological deficits.     Data Reviewed: I have personally reviewed following labs and imaging studies  CBC: Recent Labs  Lab 06/07/24 1102 06/08/24 0608 06/08/24 0830  WBC 12.8* 4.7  --   NEUTROABS 10.5* 3.0  --  HGB 14.8 9.2* 13.5  HCT 44.7 29.5* 41.9  MCV 83.1 88.9  --   PLT 253 135*  --    Basic Metabolic Panel: Recent Labs  Lab 06/07/24 1102 06/08/24 0608 06/11/24 1214  NA 140 138 141  K 3.7 3.5 3.5  CL 97* 109 96*  CO2 26 20* 28  GLUCOSE 140* 106* 138*  BUN 21 26* 27*  CREATININE 0.85 1.05* 0.94  CALCIUM  9.7 8.5* 10.5*    GFR: Estimated Creatinine Clearance: 48.7 mL/min (by C-G formula based on SCr of 0.94 mg/dL). Liver Function Tests: Recent Labs  Lab 06/07/24 1102  AST 16  ALT 14  ALKPHOS 54  BILITOT 0.7  PROT 7.2  ALBUMIN  4.6   No results for input(s): LIPASE, AMYLASE in the last 168 hours. No results for input(s): AMMONIA in the last 168 hours. Coagulation Profile: Recent Labs  Lab 06/07/24 1102  INR 1.0   Cardiac Enzymes: No results for input(s): CKTOTAL, CKMB, CKMBINDEX, TROPONINI in the last 168 hours. BNP (last 3 results) No results for input(s): PROBNP in the last 8760 hours. HbA1C: No results for input(s): HGBA1C in the last 72 hours. CBG: No results for input(s): GLUCAP in the last 168 hours. Lipid Profile: No results for input(s): CHOL, HDL, LDLCALC, TRIG, CHOLHDL, LDLDIRECT in the last 72 hours. Thyroid  Function Tests: No results for input(s): TSH, T4TOTAL, FREET4, T3FREE, THYROIDAB in the last 72 hours. Anemia Panel: No results for input(s): VITAMINB12, FOLATE, FERRITIN, TIBC, IRON , RETICCTPCT in the last 72 hours. Sepsis Labs: Recent Labs  Lab 06/07/24 1102 06/07/24 1255  LATICACIDVEN 2.2* 1.4    Recent Results (from the past 240 hours)  Blood Culture (routine x 2)     Status: None (Preliminary result)   Collection Time: 06/07/24 11:02 AM   Specimen: BLOOD  Result Value Ref Range Status   Specimen Description   Final    BLOOD LEFT ANTECUBITAL Performed at [facility], [address] [place], [place] [address]    Special Requests   Final    BOTTLES DRAWN AEROBIC AND ANAEROBIC Blood Culture results may not be optimal due to an inadequate volume of blood received in culture bottles Performed at [facility], [address] [place], [place] [address]    Culture   Final    NO GROWTH 4 DAYS Performed at [facility], [address] [place], [place] [address]    Report Status  PENDING  Incomplete  Blood Culture (routine x 2)     Status: None (Preliminary result)   Collection Time: 06/07/24 11:40 AM   Specimen: Right Antecubital; Blood  Result Value Ref Range Status   Specimen Description   Final    RIGHT ANTECUBITAL Performed at [facility], [address] [place], [place] [address]    Special Requests   Final    BOTTLES DRAWN AEROBIC AND ANAEROBIC Blood Culture results may not be optimal due to an inadequate volume of blood received in culture bottles Performed at [facility], [address] [place], [place] [address]    Culture   Final    NO GROWTH 3 DAYS Performed at [facility], [address] [place], [place] [address]    Report Status PENDING  Incomplete         Radiology Studies: No results found.       Scheduled Meds:  apixaban   5 mg Oral BID   cholecalciferol   2,000 Units Oral QHS   cyanocobalamin   1,000 mcg Oral Daily   docusate sodium   100 mg Oral BID   dofetilide   250 mcg Oral BID   febuxostat   40 mg Oral Daily   ferrous gluconate   324 mg Oral Q breakfast   fluticasone  furoate-vilanterol  1 puff Inhalation Daily   gabapentin   300 mg Oral QHS   magnesium  oxide  400 mg Oral QPM   potassium chloride   20 mEq Oral Q2H   predniSONE   50 mg Oral Q breakfast   revefenacin   175 mcg Nebulization Daily   spironolactone   25 mg Oral QPM   torsemide   40 mg Oral Daily   Continuous Infusions:  cefTRIAXone  (ROCEPHIN )  IV Stopped (06/10/24 1300)   doxycycline  (VIBRAMYCIN ) IV 100 mg (06/11/24 0934)     LOS: 3 days      Renato Applebaum, MD [facility]   "

## 2024-06-12 ENCOUNTER — Encounter: Payer: Self-pay | Admitting: *Deleted

## 2024-06-12 DIAGNOSIS — J189 Pneumonia, unspecified organism: Secondary | ICD-10-CM | POA: Diagnosis not present

## 2024-06-12 LAB — BASIC METABOLIC PANEL WITH GFR
Anion gap: 15 (ref 5–15)
BUN: 29 mg/dL — ABNORMAL HIGH (ref 8–23)
CO2: 29 mmol/L (ref 22–32)
Calcium: 10.7 mg/dL — ABNORMAL HIGH (ref 8.9–10.3)
Chloride: 96 mmol/L — ABNORMAL LOW (ref 98–111)
Creatinine, Ser: 0.78 mg/dL (ref 0.44–1.00)
GFR, Estimated: >60 mL/min
Glucose, Bld: 137 mg/dL — ABNORMAL HIGH (ref 70–99)
Potassium: 4.1 mmol/L (ref 3.5–5.1)
Sodium: 139 mmol/L (ref 135–145)

## 2024-06-12 LAB — CULTURE, BLOOD (ROUTINE X 2): Culture: NO GROWTH

## 2024-06-12 MED ORDER — PREDNISONE 20 MG PO TABS
40.0000 mg | ORAL_TABLET | Freq: Every day | ORAL | Status: DC
  Administered 2024-06-13: 40 mg via ORAL
  Filled 2024-06-12 (×2): qty 2

## 2024-06-12 MED ORDER — OXYCODONE-ACETAMINOPHEN 5-325 MG PO TABS: 1.0000 | ORAL_TABLET | Freq: Three times a day (TID) | ORAL | 0 refills | Status: AC | PRN

## 2024-06-12 MED ORDER — HYDROCOD POLI-CHLORPHE POLI ER 10-8 MG/5ML PO SUER: 5.0000 mL | Freq: Two times a day (BID) | ORAL | 0 refills | Status: AC | PRN

## 2024-06-12 NOTE — Plan of Care (Signed)

## 2024-06-12 NOTE — TOC Progression Note (Addendum)
 Transition of Care [facility]) - Progression Note    Patient Details  Name: Kathleen [name] MRN: [id number] Date of Birth: 1946-04-01  Transition of Care [facility]) CM/SW Contact  Heather DELENA Saltness, LCSW Phone Number: 06/12/2024, 2:53 PM  Clinical Narrative:    CSW met with pt at bedside to discuss SNF rehab availability and obtain pt's preferred facility. CSW provided pt with list of SNF's with available beds including name of facility, location, and Medicare Star-Rating. Pt chooses bed at [facility]. CSW notified [facility] of acceptance. [facility] reports pt can admit to facility tomorrow 1/6.  [facility] [address] [place], [place] [address] [phone] Overall rating ???? Above average  [facility] [address] [place], [place] [address] [phone] Overall rating ??? Much above average  [facility] [address] [place], [place] [address] [phone] Overall rating ?????  [facility] [address] [place], [place] [address] [phone] Overall rating ????  [facility] [address] [place], [place] [address] [phone] Overall rating ? Much below average  [facility] [address] [place], [place] [address] [phone] Overall rating ??? Much below average  [facility] [address] [place], [place] [address] [phone] Overall rating ?? Much below average  [facility] [address] [place], [place] [address] [phone] Overall rating ?? Much below average   Expected Discharge Plan: Skilled Nursing Facility Barriers to Discharge: Continued Medical Work up, SNF Pending bed offer   Expected Discharge Plan and Services In-house Referral: Clinical Social Work Discharge  Planning Services: NA Post Acute Care Choice: Skilled Nursing Facility Living arrangements for the past 2 months: Independent Living Facility ([facility])                 DME Arranged: N/A DME Agency: NA                   Social Drivers of Health (SDOH) Interventions SDOH Screenings   Food Insecurity: No Food Insecurity (06/08/2024)  Housing: Low Risk (06/08/2024)  Transportation Needs: No Transportation Needs (06/08/2024)  Utilities: Not At Risk (06/08/2024)  Alcohol Screen: Low Risk (09/15/2023)  Depression (PHQ2-9): Low Risk (06/05/2024)  Financial Resource Strain: Low Risk (09/15/2023)  Physical Activity: Insufficiently Active (09/15/2023)  Social Connections: Moderately Integrated (06/08/2024)  Stress: Stress Concern Present (09/15/2023)  Tobacco Use: Medium Risk (06/07/2024)  Health Literacy: Adequate Health Literacy (09/15/2023)    Readmission Risk Interventions    06/11/2024    3:19 PM 05/18/2023   12:10 PM 04/26/2023    9:51 AM  Readmission Risk Prevention Plan  Transportation Screening Complete Complete Complete  PCP or Specialist Appt within 3-5 Days Complete  Complete  HRI or Home Care Consult Complete  Complete  Social Work Consult for Recovery Care Planning/Counseling Complete    Palliative Care Screening Not Applicable  --  Medication Review Oceanographer) Complete Complete Complete  PCP or Specialist appointment within 3-5 days of discharge  Complete   HRI or Home Care Consult  Complete   SW Recovery Care/Counseling Consult  Complete   Palliative Care Screening  Not Applicable   Skilled Nursing Facility  Complete     Signed: Heather Saltness, MSW, LCSW Clinical Social Worker Inpatient Care Management 06/12/2024 2:57 PM

## 2024-06-12 NOTE — Plan of Care (Signed)
" °  Problem: Education: Goal: Knowledge of General Education information will improve Description: Including pain rating scale, medication(s)/side effects and non-pharmacologic comfort measures Outcome: Progressing   Problem: Health Behavior/Discharge Planning: Goal: Ability to manage health-related needs will improve Outcome: Progressing   Problem: Clinical Measurements: Goal: Ability to maintain clinical measurements within normal limits will improve Outcome: Progressing Goal: Will remain free from infection Outcome: Progressing Goal: Diagnostic test results will improve Outcome: Progressing Goal: Respiratory complications will improve Outcome: Progressing Goal: Cardiovascular complication will be avoided Outcome: Progressing   Problem: Activity: Goal: Risk for activity intolerance will decrease Outcome: Progressing   Problem: Nutrition: Goal: Adequate nutrition will be maintained Outcome: Progressing   Problem: Coping: Goal: Level of anxiety will decrease Outcome: Progressing   Problem: Pain Managment: Goal: General experience of comfort will improve and/or be controlled Outcome: Progressing   Problem: Elimination: Goal: Will not experience complications related to bowel motility Outcome: Progressing Goal: Will not experience complications related to urinary retention Outcome: Progressing   Problem: Education: Goal: Knowledge of disease or condition will improve Outcome: Progressing Goal: Knowledge of the prescribed therapeutic regimen will improve Outcome: Progressing Goal: Individualized Educational Video(s) Outcome: Progressing   Problem: Skin Integrity: Goal: Risk for impaired skin integrity will decrease Outcome: Progressing   Problem: Respiratory: Goal: Ability to maintain a clear airway will improve Outcome: Progressing Goal: Levels of oxygenation will improve Outcome: Progressing Goal: Ability to maintain adequate ventilation will improve Outcome:  Progressing   Problem: Respiratory: Goal: Ability to maintain adequate ventilation will improve Outcome: Progressing Goal: Ability to maintain a clear airway will improve Outcome: Progressing   Problem: Clinical Measurements: Goal: Ability to maintain a body temperature in the normal range will improve Outcome: Progressing   "

## 2024-06-12 NOTE — Progress Notes (Signed)
 " PROGRESS NOTE    Kathleen [name]  FMW:[id number] DOB: Dec 19, 1945 DOA: 06/07/2024 PCP: Joshua Debby CROME, MD    Brief Narrative:  79 year old with history of COPD and chronic hypoxemic failure on 4 L nasal cannula oxygen , A-fib on Eliquis  and Tikosyn , heart failure with preserved ejection fraction, recurrent diverticulitis status post colostomy who was recently admitted to the hospital with influenza A infection and COPD exacerbation 12/22-12/26 and went home, was initially better.  Since last few days started having more cough and rhonchorous wheezing so came back to the emergency room. In the emergency room normotensive, normal temperature, blood pressure stable.  On 5 L oxygen .  Chest x-ray with possible retrocardiac opacity.  Clinically diffuse wheezing.  Patient was given DuoNebs, Solu-Medrol , IV Rocephin  and azithromycin  and admitted to the hospital due to significant symptoms.  Subjective:  Patient was seen and examined.  Still has significant wheezing but much better than before.  No other overnight events.  Completed antibiotics.  Agreeable to go to SNF. Patient is appropriately anxious about her health and needing for extra support at home.  Assessment & Plan:   Postviral pneumonia, COPD exacerbation secondary to postviral pneumonia and recent influenza A: Chronic hypoxemia on 4 L oxygen . Significant symptoms however medically stabilizing. Continue oral steroids with slow taper, Breo, Yupelri , scheduled and as needed bronchodilators, deep breathing exercises, incentive spirometry, chest physiotherapy . Mobility with PT OT. Completed 5 days of Rocephin  and doxycycline .   Blood cultures negative. MRSA swab negative. Supplemental oxygen  to keep saturations more than 90%.  Continue to work with PT OT.  Persistent A-fib: Therapeutic on Eliquis .  Patient on Tikosyn .  Electrolytes are adequate.  On potassium supplements.  Hypertension: Blood pressure stable on spironolactone  and  torsemide .  Med stabilized to transition to SNF when bed available.  DVT prophylaxis: Place TED hose Start: 06/07/24 1440 apixaban  (ELIQUIS ) tablet 5 mg   Code Status: Full code Family Communication: None at the bedside Disposition Plan: Status is: Inpatient Remains inpatient appropriate because: Medically stable.  Transition to SNF when bed available.   Consultants:  None  Procedures:  None  Antimicrobials:  Rocephin  and doxycycline  12/31--1/4     Objective: Vitals:   06/11/24 0842 06/11/24 1344 06/11/24 2057 06/12/24 0938  BP:  135/63 (!) 140/80   Pulse:  71 72   Resp:  20 18   Temp:  97.9 F (36.6 C) 98.6 F (37 C)   TempSrc:  Oral Oral   SpO2: 100% 92% 97% 95%  Weight:      Height:       No intake or output data in the 24 hours ending 06/12/24 1140  Filed Weights   06/07/24 1035  Weight: 85.6 kg    Examination:  General: Cannot talk in complete sentences.  Mildly dyspneic on talking. Cardiovascular: S1-S2 normal.  Regular rate rhythm. Respiratory: Bilateral upper airway sounds.  Bronchial breath sounds.  Some expiratory wheezes. SpO2: 95 % O2 Flow Rate (L/min): 4 L/min (found on 2, patient says she wears 4L at home) FiO2 (%): 36 %  Gastrointestinal: Soft.  Nontender.  Bowel sound present.  Colostomy with loose stool. Ext: No swelling or edema. Neuro: Alert awake and oriented.  No focal neurological deficits.     Data Reviewed: I have personally reviewed following labs and imaging studies  CBC: Recent Labs  Lab 06/07/24 1102 06/08/24 0608 06/08/24 0830  WBC 12.8* 4.7  --   NEUTROABS 10.5* 3.0  --   HGB 14.8 9.2* 13.5  HCT 44.7 29.5* 41.9  MCV 83.1 88.9  --   PLT 253 135*  --    Basic Metabolic Panel: Recent Labs  Lab 06/07/24 1102 06/08/24 0608 06/11/24 1214 06/12/24 0518  NA 140 138 141 139  K 3.7 3.5 3.5 4.1  CL 97* 109 96* 96*  CO2 26 20* 28 29  GLUCOSE 140* 106* 138* 137*  BUN 21 26* 27* 29*  CREATININE 0.85 1.05* 0.94  0.78  CALCIUM  9.7 8.5* 10.5* 10.7*   GFR: Estimated Creatinine Clearance: 57.2 mL/min (by C-G formula based on SCr of 0.78 mg/dL). Liver Function Tests: Recent Labs  Lab 06/07/24 1102  AST 16  ALT 14  ALKPHOS 54  BILITOT 0.7  PROT 7.2  ALBUMIN  4.6   No results for input(s): LIPASE, AMYLASE in the last 168 hours. No results for input(s): AMMONIA in the last 168 hours. Coagulation Profile: Recent Labs  Lab 06/07/24 1102  INR 1.0   Cardiac Enzymes: No results for input(s): CKTOTAL, CKMB, CKMBINDEX, TROPONINI in the last 168 hours. BNP (last 3 results) No results for input(s): PROBNP in the last 8760 hours. HbA1C: No results for input(s): HGBA1C in the last 72 hours. CBG: No results for input(s): GLUCAP in the last 168 hours. Lipid Profile: No results for input(s): CHOL, HDL, LDLCALC, TRIG, CHOLHDL, LDLDIRECT in the last 72 hours. Thyroid  Function Tests: No results for input(s): TSH, T4TOTAL, FREET4, T3FREE, THYROIDAB in the last 72 hours. Anemia Panel: No results for input(s): VITAMINB12, FOLATE, FERRITIN, TIBC, IRON , RETICCTPCT in the last 72 hours. Sepsis Labs: Recent Labs  Lab 06/07/24 1102 06/07/24 1255  LATICACIDVEN 2.2* 1.4    Recent Results (from the past 240 hours)  Blood Culture (routine x 2)     Status: None   Collection Time: 06/07/24 11:02 AM   Specimen: BLOOD  Result Value Ref Range Status   Specimen Description   Final    BLOOD LEFT ANTECUBITAL Performed at [facility], [address] [place], [place] [address]    Special Requests   Final    BOTTLES DRAWN AEROBIC AND ANAEROBIC Blood Culture results may not be optimal due to an inadequate volume of blood received in culture bottles Performed at [facility], [address] [place], [place] [address]    Culture   Final    NO GROWTH 5 DAYS Performed at [facility], [address], [place], [place] [address]     Report Status 06/12/2024 FINAL  Final  Blood Culture (routine x 2)     Status: None (Preliminary result)   Collection Time: 06/07/24 11:40 AM   Specimen: Right Antecubital; Blood  Result Value Ref Range Status   Specimen Description   Final    RIGHT ANTECUBITAL Performed at [facility], [address] [place], [place] [address]    Special Requests   Final    BOTTLES DRAWN AEROBIC AND ANAEROBIC Blood Culture results may not be optimal due to an inadequate volume of blood received in culture bottles Performed at [facility], [address] [place], [place] [address]    Culture   Final    NO GROWTH 4 DAYS Performed at [facility], [address] [place], [place] [address]    Report Status PENDING  Incomplete         Radiology Studies: No results found.       Scheduled Meds:  apixaban   5 mg Oral  BID   cholecalciferol   2,000 Units Oral QHS   cyanocobalamin   1,000 mcg Oral Daily   docusate sodium   100 mg Oral BID   dofetilide   250 mcg Oral BID   febuxostat   40 mg Oral Daily   ferrous gluconate   324 mg Oral Q breakfast   fluticasone  furoate-vilanterol  1 puff Inhalation Daily   gabapentin   300 mg Oral QHS   magnesium  oxide  400 mg Oral QPM   [START ON 06/13/2024] predniSONE   40 mg Oral Q breakfast   revefenacin   175 mcg Nebulization Daily   spironolactone   25 mg Oral QPM   torsemide   40 mg Oral Daily   Continuous Infusions:     LOS: 4 days      Renato Applebaum, MD [facility]   "

## 2024-06-12 NOTE — Telephone Encounter (Signed)
 Verbal orders has been given

## 2024-06-13 ENCOUNTER — Ambulatory Visit (HOSPITAL_COMMUNITY): Admitting: Internal Medicine

## 2024-06-13 DIAGNOSIS — J189 Pneumonia, unspecified organism: Secondary | ICD-10-CM | POA: Diagnosis not present

## 2024-06-13 DIAGNOSIS — J440 Chronic obstructive pulmonary disease with acute lower respiratory infection: Secondary | ICD-10-CM

## 2024-06-13 LAB — CULTURE, BLOOD (ROUTINE X 2): Culture: NO GROWTH

## 2024-06-13 MED ORDER — POTASSIUM CHLORIDE ER 10 MEQ PO TBCR: 40.0000 meq | EXTENDED_RELEASE_TABLET | Freq: Every day | ORAL | Status: AC

## 2024-06-13 MED ORDER — MAGNESIUM OXIDE -MG SUPPLEMENT 400 (240 MG) MG PO TABS: 400.0000 mg | ORAL_TABLET | Freq: Every evening | ORAL | Status: AC

## 2024-06-13 MED ORDER — PREDNISONE 20 MG PO TABS: ORAL_TABLET | ORAL | Status: AC

## 2024-06-13 NOTE — TOC Transition Note (Signed)
 Transition of Care [facility]) - Discharge Note   Patient Details  Name: Kathleen [name] MRN: [id number] Date of Birth: 1945-09-22  Transition of Care [facility]) CM/SW Contact:  Heather DELENA Saltness, LCSW Phone Number: 06/13/2024, 11:04 AM   Clinical Narrative:    Pt discharging to [facility] today for short-term SNF rehab. Pt admitting to room 808p. D/C packet placed in pt's chart at RN station. RN to call report to [phone]. PTAR called at [phone]. Pt made aware and in agreement with discharge plan. No further TOC needs at this time.   Final next level of care: Skilled Nursing Facility Barriers to Discharge: Barriers Resolved   Patient Goals and CMS Choice Patient states their goals for this hospitalization and ongoing recovery are:: To go to [facility] for SNF CMS Medicare.gov Compare Post Acute Care list provided to:: Patient Choice offered to / list presented to : Patient [place] ownership interest in [facility].provided to:: Patient    Discharge Placement   Patient chooses bed at: [facility] Patient to be transferred to facility by: PTAR Name of family member notified: Patient Patient and family notified of of transfer: 06/13/24  Discharge Plan and Services Additional resources added to the After Visit Summary for  Follow Up In-house Referral: Clinical Social Work Discharge Planning Services: NA Post Acute Care Choice: Skilled Nursing Facility          DME Arranged: N/A DME Agency: NA       HH Arranged: NA HH Agency: NA        Social Drivers of Health (SDOH) Interventions SDOH Screenings   Food Insecurity: No Food Insecurity (06/08/2024)  Housing: Low Risk (06/08/2024)  Transportation Needs: No Transportation Needs (06/08/2024)  Utilities: Not At Risk (06/08/2024)  Alcohol Screen: Low Risk (09/15/2023)  Depression (PHQ2-9): Low Risk (06/05/2024)  Financial Resource Strain: Low Risk (09/15/2023)  Physical Activity: Insufficiently Active (09/15/2023)   Social Connections: Moderately Integrated (06/08/2024)  Stress: Stress Concern Present (09/15/2023)  Tobacco Use: Medium Risk (06/07/2024)  Health Literacy: Adequate Health Literacy (09/15/2023)     Readmission Risk Interventions    06/11/2024    3:19 PM 05/18/2023   12:10 PM 04/26/2023    9:51 AM  Readmission Risk Prevention Plan  Transportation Screening Complete Complete Complete  PCP or Specialist Appt within 3-5 Days Complete  Complete  HRI or Home Care Consult Complete  Complete  Social Work Consult for Recovery Care Planning/Counseling Complete    Palliative Care Screening Not Applicable  --  Medication Review Oceanographer) Complete Complete Complete  PCP or Specialist appointment within 3-5 days of discharge  Complete   HRI or Home Care Consult  Complete   SW Recovery Care/Counseling Consult  Complete   Palliative Care Screening  Not Applicable   Skilled Nursing Facility  Complete      Signed: Heather Saltness, MSW, LCSW Clinical Social Worker Inpatient Care Management 06/13/2024 11:31 AM

## 2024-06-13 NOTE — Discharge Summary (Signed)
 Physician Discharge Summary  Kathleen [name] FMW:[id number] DOB: 1945-09-20 DOA: 06/07/2024  PCP: Joshua Debby CROME, MD  Admit date: 06/07/2024 Discharge date: 06/13/2024  Admitted From: Home Disposition: Skilled nursing facility  Recommendations for Outpatient Follow-up:  Follow up with PCP in 1-2 weeks Follow-up with pulmonology  Home Health: N/A Equipment/Devices: N/A  Discharge Condition: Stable and fair CODE STATUS: Full code Diet recommendation: Low-salt diet  Discharge summary: 79 year old with history of COPD and chronic hypoxemic failure on 4 L nasal cannula oxygen , A-fib on Eliquis  and Tikosyn , heart failure with preserved ejection fraction, recurrent diverticulitis status post colostomy who was recently admitted to the hospital with influenza A infection and COPD exacerbation 12/22-12/26 and went home, was initially better.  Since last few days started having more cough and rhonchorous wheezing so came back to the emergency room. In the emergency room normotensive, normal temperature, blood pressure stable.  On 5 L oxygen .  Chest x-ray with possible retrocardiac opacity.  Clinically diffuse wheezing.  Patient was given DuoNebs, Solu-Medrol , IV Rocephin  and azithromycin  and admitted to the hospital due to significant symptoms.  Patient was treated with multimodal therapy, some improvement of symptoms today.  Needs to go to subacute rehab.   # Postviral pneumonia, COPD exacerbation secondary to postviral pneumonia and recent influenza A: Chronic hypoxemia on 4 L oxygen . She had persistent wheezing and intractable cough.  Treated with IV steroids.  Will discharge patient on slow taper of oral steroids, continue Breo and bronchodilator therapy.  She will continue to chest PT therapy at a SNF.  Completed 5 days of antibiotics. Continue to work for mobility with PT OT. Blood cultures negative. MRSA swab negative. Supplemental oxygen  to keep saturations more than 90%.  Continue to work  with PT OT.   Persistent A-fib: Therapeutic on Eliquis .  Patient on Tikosyn .  Electrolytes are adequate.  On potassium supplements.   Hypertension: Blood pressure stable on spironolactone  and torsemide .   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  Chronic pain syndrome: Patient on opiate prescriptions from outpatient.  Continued.  Chronically sick.  Medically stabilized to transition to a SNF today.   Discharge Diagnoses:  Principal Problem:   Community acquired pneumonia Active Problems:   Chronic respiratory failure with hypoxia (HCC)   COPD (chronic obstructive pulmonary disease) (HCC)   HTN (hypertension)   Persistent atrial fibrillation (HCC)   Cough   Muscular chest pain   Protein-calorie malnutrition, moderate   Physical deconditioning   CAP (community acquired pneumonia)    Discharge Instructions  Discharge Instructions     Diet - low sodium heart healthy   Complete by: As directed    Increase activity slowly   Complete by: As directed       Allergies as of 06/13/2024       Reactions   Allopurinol Other (See Comments)   Reaction:  Dizziness    Clindamycin Anaphylaxis, Hives   Flublok [influenza Vaccine Recombinant] Other (See Comments)   Fever 103 with no alternative explanation day after vaccine.  Darice Highfill FNP-C   Pneumococcal 13-val Conj Vacc Itching, Swelling, Rash   Dronedarone Rash   Montelukast  Other (See Comments)   Makes her loopy.   Brovana  [arformoterol ]    Caused muscle pain   Budesonide     Caused extreme joint pain   Entresto  [sacubitril -valsartan ] Other (See Comments)   hypotension   Fosamax [alendronate Sodium] Nausea Only   Jardiance  [empagliflozin ]    Caused a vaginal infection   Meperidine Nausea And Vomiting  Microplegia Msa-msg [plegisol]    Loopy,diarrhea   Rosuvastatin Other (See Comments)   Reaction:  Muscle spasms    Tetracycline Hives   Adhesive [tape] Rash   Lovastatin Rash, Other (See  Comments)   Muscle Pain        Medication List     TAKE these medications    azelastine  0.1 % nasal spray Commonly known as: ASTELIN  Place 2 sprays into both nostrils 2 (two) times daily.   Breo Ellipta  100-25 MCG/ACT Aepb Generic drug: fluticasone  furoate-vilanterol inhale ONE PUFF into THE lungs daily.   chlorpheniramine-HYDROcodone  10-8 MG/5ML Commonly known as: TUSSIONEX Take 5 mLs by mouth every 12 (twelve) hours as needed for cough.   cyanocobalamin  1000 MCG tablet Commonly known as: VITAMIN B12 Take 1,000 mcg by mouth daily.   docusate sodium  100 MG capsule Commonly known as: COLACE Take 100 mg by mouth 2 (two) times daily.   dofetilide  250 MCG capsule Commonly known as: TIKOSYN  TAKE ONE CAPSULE BY MOUTH EVERY MORNING AND AT BEDTIME.   Eliquis  5 MG Tabs tablet Generic drug: apixaban  Take 1 tablet (5 mg total) by mouth 2 (two) times daily.   febuxostat  40 MG tablet Commonly known as: ULORIC  Take 1 tablet (40 mg total) by mouth daily.   ferrous gluconate  324 MG tablet Commonly known as: FERGON Take 1 tablet (324 mg total) by mouth daily with breakfast.   gabapentin  300 MG capsule Commonly known as: Neurontin  Take 1 capsule (300 mg total) by mouth at bedtime.   guaifenesin  100 MG/5ML syrup Commonly known as: ROBITUSSIN Take 200 mg by mouth 3 (three) times daily as needed for cough.   ketoconazole  2 % cream Commonly known as: NIZORAL  Apply 1 Application topically 2 (two) times daily.   Magnesium  500 MG Tabs Take 500 mg by mouth every evening.   magnesium  oxide 400 (240 Mg) MG tablet Commonly known as: MAG-OX Take 1 tablet (400 mg total) by mouth every evening.   mupirocin  ointment 2 % Commonly known as: BACTROBAN  Apply 1 Application topically 2 (two) times daily.   naloxone  4 MG/0.1ML Liqd nasal spray kit Commonly known as: NARCAN  Place 1 spray into the nose once as needed (opioid overdose).   oxyCODONE -acetaminophen  10-325 MG  tablet Commonly known as: PERCOCET Take 0.5-1 tablets by mouth See admin instructions. *1-3 times daily* What changed: Another medication with the same name was added. Make sure you understand how and when to take each.   oxyCODONE -acetaminophen  5-325 MG tablet Commonly known as: PERCOCET/ROXICET Take 1-2 tablets by mouth every 8 (eight) hours as needed for moderate pain (pain score 4-6) or severe pain (pain score 7-10) (1 tab for moderate pain, 2 tab for severe pain). What changed: You were already taking a medication with the same name, and this prescription was added. Make sure you understand how and when to take each.   OXYGEN  Inhale 4 L into the lungs continuous. Use with resmed ventilator   potassium chloride  10 MEQ tablet Commonly known as: KLOR-CON  Take 4 tablets (40 mEq total) by mouth daily.   predniSONE  20 MG tablet Commonly known as: DELTASONE  Take 2 tablets (40 mg total) by mouth daily with breakfast for 3 days, THEN 1 tablet (20 mg total) daily with breakfast for 3 days, THEN 1 tablet (20 mg total) daily with breakfast for 3 days, THEN 0.5 tablets (10 mg total) daily with breakfast for 3 days. Start taking on: June 14, 2024 What changed:  medication strength See the new instructions.  revefenacin  175 MCG/3ML nebulizer solution Commonly known as: YUPELRI  Take 175 mcg by nebulization daily.   spironolactone  25 MG tablet Commonly known as: ALDACTONE  Take 1 tablet (25 mg total) by mouth every evening.   torsemide  20 MG tablet Commonly known as: DEMADEX  Take 2 tablets (40 mg total) by mouth daily.   triamcinolone  cream 0.1 % Commonly known as: KENALOG  Apply 1 Application topically 2 (two) times daily.   Vitamin D3 50 MCG (2000 UT) Tabs Take 2,000 Units by mouth at bedtime.        Contact information for after-discharge care     Destination     [facility] [place] .   Service: Skilled Nursing Contact information: [address] [place]  [address] [phone]                    Allergies[1]  Consultations: None   Procedures/Studies: DG Chest Port 1 View Result Date: 06/07/2024 EXAM: 1 VIEW(S) XRAY OF THE CHEST 06/07/2024 11:22:00 AM COMPARISON: 05/29/2024 CLINICAL HISTORY: Questionable sepsis - evaluate for abnormality FINDINGS: LINES, TUBES AND DEVICES: 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 LUNGS AND PLEURA: Patient is rotated. Hyperinflation of the lungs. Chronic coarse and interstitial markings. Increase of retrocardiac airspace opacity. Left costophrenic angle blunting with possible trace left pleural effusion. No pneumothorax. HEART AND MEDIASTINUM: Unchanged cardiomediastinal silhouette. Atherosclerotic plaque. BONES AND SOFT TISSUES: Degenerative changes in both shoulders. No acute osseous abnormality. IMPRESSION: 1. Increased retrocardiac airspace opacity, concerning for pneumonia. Left costophrenic angle blunting with possible trace left pleural effusion.Follow-up PA and lateral chest X-ray is recommended in 3-4 weeks following therapy to ensure resolution . Electronically signed by: Morgane Naveau MD 06/07/2024 12:27 PM EST RP Workstation: [id number]   CT Angio Chest PE W and/or Wo Contrast Result Date: 05/29/2024 CLINICAL DATA:  Cough, shortness of breath and fever. EXAM: CT ANGIOGRAPHY CHEST WITH CONTRAST TECHNIQUE: Multidetector CT imaging of the chest was performed using the standard protocol during bolus administration of intravenous contrast. Multiplanar CT image reconstructions and MIPs were obtained to evaluate the vascular anatomy. RADIATION DOSE REDUCTION: This exam was performed according to the departmental dose-optimization program which includes automated exposure control, adjustment of the mA and/or kV according to patient size and/or use of iterative reconstruction technique. CONTRAST:  80mL OMNIPAQUE  IOHEXOL  350 MG/ML SOLN COMPARISON:  Radiograph  earlier today.  Chest CT 03/30/2023 FINDINGS: Cardiovascular: There are no filling defects within the pulmonary arteries to suggest pulmonary embolus. CardioMEMS in the left lower lobe pulmonary artery. Atherosclerosis of the thoracic aorta. No aortic aneurysm. Limited aortic assessment, but no findings to suggest acute aortic injury. 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 The heart is mildly enlarged. There are coronary artery calcifications. No pericardial effusion. Mediastinum/Nodes: Shotty mediastinal lymph nodes not enlarged by size criteria. No hilar adenopathy. Patulous upper esophagus. Lungs/Pleura: Moderate emphysema. Diffuse bronchial thickening, greatest in the bilateral lower lobes and right upper lobe. Bandlike subsegmental scarring in the lower lobes. No confluent opacity. 4 mm left upper lobe pulmonary nodule, series 6, image 41, unchanged from prior exam. Punctate left upper lobe nodule series 6, image 56, also unchanged. No new pulmonary nodules. No pleural effusion. Upper Abdomen: Small cyst in the central liver. Broad-based upper abdominal ventral abdominal wall hernia containing portions of the stomach, partially included in the field of view. Cholecystectomy. No acute upper abdominal findings. Musculoskeletal: Degenerative change throughout the spine. Chronic shoulder arthropathy. There are no acute or suspicious osseous abnormalities.  Review of the MIP images confirms the above findings. IMPRESSION: 1. No pulmonary embolus. 2. Moderate emphysema. Moderate bronchial thickening. Subsegmental bandlike atelectasis but no evidence of pneumonia. 3. Stable small left upper lobe pulmonary nodules, largest measuring 4 mm. These nodules can be considered benign and no specific imaging follow-up. Aortic Atherosclerosis (ICD10-I70.0) and Emphysema (ICD10-J43.9). Electronically Signed   By: Andrea Gasman M.D.   On: 05/29/2024 19:44   DG Chest 2 View Result Date:  05/29/2024 CLINICAL DATA:  Cough. EXAM: CHEST - 2 VIEW COMPARISON:  Chest CT dated 01/19/2024 FINDINGS: Background of emphysema. There are bibasilar atelectasis/scarring. Pneumonia is not excluded. There is mild cardiomegaly. Left pectoral pacemaker device. No acute osseous pathology. IMPRESSION: Bibasilar atelectasis/scarring. Pneumonia is not excluded. Electronically Signed   By: Vanetta Chou M.D.   On: 05/29/2024 13:48   (Echo, Carotid, EGD, Colonoscopy, ERCP)    Subjective: Patient seen in the morning rounds.  She still has some hacking cough and some upper airway sounds but overall feels better.  Comfortable with plan to transition to SNF today. She is also considering other housing options.   Discharge Exam: Vitals:   06/13/24 0500 06/13/24 0754  BP: (!) 144/77   Pulse: 69   Resp: 18   Temp: (!) 97.4 F (36.3 C)   SpO2: 99% 93%   Vitals:   06/12/24 1334 06/12/24 1950 06/13/24 0500 06/13/24 0754  BP: (!) 151/70 136/73 (!) 144/77   Pulse: 70 70 69   Resp: 16 18 18    Temp: 98.1 F (36.7 C) 98.1 F (36.7 C) (!) 97.4 F (36.3 C)   TempSrc:  Oral Oral   SpO2: 95% 96% 99% 93%  Weight:      Height:        General: Pt is alert, awake, not in acute distress Chronically sick looking.  Frail and debilitated. Cardiovascular: RRR, S1/S2 +, no rubs, no gallops Respiratory: CTA bilaterally, conducted upper airway sounds.  Audible bronchial sounds.  On 4 L oxygen . Abdominal: Soft, NT, ND, bowel sounds +, colostomy left lower quadrant with loose stool. Extremities: no edema, no cyanosis    The results of significant diagnostics from this hospitalization (including imaging, microbiology, ancillary and laboratory) are listed below for reference.     Microbiology: Recent Results (from the past 240 hours)  Blood Culture (routine x 2)     Status: None   Collection Time: 06/07/24 11:02 AM   Specimen: BLOOD  Result Value Ref Range Status   Specimen Description   Final    BLOOD  LEFT ANTECUBITAL Performed at [facility], [address] [place], [place] [address]    Special Requests   Final    BOTTLES DRAWN AEROBIC AND ANAEROBIC Blood Culture results may not be optimal due to an inadequate volume of blood received in culture bottles Performed at [facility], [address] [place], [place] [address]    Culture   Final    NO GROWTH 5 DAYS Performed at [facility], [address], [place], [place] [address]    Report Status 06/12/2024 FINAL  Final  Blood Culture (routine x 2)     Status: None   Collection Time: 06/07/24 11:40 AM   Specimen: Right Antecubital; Blood  Result Value Ref Range Status   Specimen Description   Final    RIGHT ANTECUBITAL Performed at [facility], [address] [place], [place] [address]    Special Requests  Final    BOTTLES DRAWN AEROBIC AND ANAEROBIC Blood Culture results may not be optimal due to an inadequate volume of blood received in culture bottles Performed at [facility], [address] [place], [place] [address]    Culture   Final    NO GROWTH 5 DAYS Performed at [facility], [address], [place], [place] [address]    Report Status 06/13/2024 FINAL  Final     Labs: BNP (last 3 results) Recent Labs    01/11/24 1735 01/19/24 1337 02/01/24 1619  BNP 96.9 43.2 65.5   Basic Metabolic Panel: Recent Labs  Lab 06/07/24 1102 06/08/24 0608 06/11/24 1214 06/12/24 0518  NA 140 138 141 139  K 3.7 3.5 3.5 4.1  CL 97* 109 96* 96*  CO2 26 20* 28 29  GLUCOSE 140* 106* 138* 137*  BUN 21 26* 27* 29*  CREATININE 0.85 1.05* 0.94 0.78  CALCIUM  9.7 8.5* 10.5* 10.7*   Liver Function Tests: Recent Labs  Lab 06/07/24 1102  AST 16  ALT 14  ALKPHOS 54  BILITOT 0.7  PROT 7.2  ALBUMIN  4.6   No results for input(s): LIPASE, AMYLASE in the last 168 hours. No results for input(s): AMMONIA in the last 168 hours. CBC: Recent Labs  Lab  06/07/24 1102 06/08/24 0608 06/08/24 0830  WBC 12.8* 4.7  --   NEUTROABS 10.5* 3.0  --   HGB 14.8 9.2* 13.5  HCT 44.7 29.5* 41.9  MCV 83.1 88.9  --   PLT 253 135*  --    Cardiac Enzymes: No results for input(s): CKTOTAL, CKMB, CKMBINDEX, TROPONINI in the last 168 hours. BNP: Invalid input(s): POCBNP CBG: No results for input(s): GLUCAP in the last 168 hours. D-Dimer No results for input(s): DDIMER in the last 72 hours. Hgb A1c No results for input(s): HGBA1C in the last 72 hours. Lipid Profile No results for input(s): CHOL, HDL, LDLCALC, TRIG, CHOLHDL, LDLDIRECT in the last 72 hours. Thyroid  function studies No results for input(s): TSH, T4TOTAL, T3FREE, THYROIDAB in the last 72 hours.  Invalid input(s): FREET3 Anemia work up No results for input(s): VITAMINB12, FOLATE, FERRITIN, TIBC, IRON , RETICCTPCT in the last 72 hours. Urinalysis    Component Value Date/Time   COLORURINE YELLOW 01/19/2024 1536   APPEARANCEUR CLOUDY (A) 01/19/2024 1536   APPEARANCEUR Clear 09/23/2023 1118   LABSPEC 1.013 01/19/2024 1536   LABSPEC 1.021 12/25/2012 1653   PHURINE 7.0 01/19/2024 1536   GLUCOSEU NEGATIVE 01/19/2024 1536   GLUCOSEU NEGATIVE 08/10/2023 1012   HGBUR MODERATE (A) 01/19/2024 1536   BILIRUBINUR NEGATIVE 01/19/2024 1536   BILIRUBINUR negative 12/06/2023 1719   BILIRUBINUR Negative 09/23/2023 1118   BILIRUBINUR Negative 12/25/2012 1653   KETONESUR NEGATIVE 01/19/2024 1536   PROTEINUR 30 (A) 01/19/2024 1536   UROBILINOGEN 0.2 12/06/2023 1719   UROBILINOGEN 0.2 08/10/2023 1012   NITRITE NEGATIVE 01/19/2024 1536   LEUKOCYTESUR LARGE (A) 01/19/2024 1536   LEUKOCYTESUR Negative 12/25/2012 1653   Sepsis Labs Recent Labs  Lab 06/07/24 1102 06/08/24 0608  WBC 12.8* 4.7   Microbiology Recent Results (from the past 240 hours)  Blood Culture (routine x 2)     Status: None   Collection Time: 06/07/24 11:02 AM   Specimen:  BLOOD  Result Value Ref Range Status   Specimen Description   Final    BLOOD LEFT ANTECUBITAL Performed at [facility], [address] [place], [place] [address]    Special Requests  Final    BOTTLES DRAWN AEROBIC AND ANAEROBIC Blood Culture results may not be optimal due to an inadequate volume of blood received in culture bottles Performed at [facility], [address] [place], [place] [address]    Culture   Final    NO GROWTH 5 DAYS Performed at [facility], [address], [place], [place] [address]    Report Status 06/12/2024 FINAL  Final  Blood Culture (routine x 2)     Status: None   Collection Time: 06/07/24 11:40 AM   Specimen: Right Antecubital; Blood  Result Value Ref Range Status   Specimen Description   Final    RIGHT ANTECUBITAL Performed at [facility], [address] [place], [place] [address]    Special Requests   Final    BOTTLES DRAWN AEROBIC AND ANAEROBIC Blood Culture results may not be optimal due to an inadequate volume of blood received in culture bottles Performed at [facility], [address] [place], [place] [address]    Culture   Final    NO GROWTH 5 DAYS Performed at [facility], [address] [place], [place] [address]    Report Status 06/13/2024 FINAL  Final     Time coordinating discharge: 40 minutes  SIGNED:   Renato Applebaum, MD  [facility] 06/13/2024, 9:34 AM     [1]  Allergies Allergen Reactions   Allopurinol Other (See Comments)    Reaction:  Dizziness    Clindamycin Anaphylaxis and Hives   Flublok [Influenza Vaccine Recombinant] Other (See Comments)    Fever 103 with no alternative explanation day after vaccine.  Darice Highfill FNP-C   Pneumococcal 13-Val Conj Vacc Itching, Swelling and Rash   Dronedarone Rash   Montelukast  Other (See Comments)    Makes her loopy.   Brovana  [Arformoterol ]     Caused muscle pain   Budesonide      Caused extreme  joint pain   Entresto  [Sacubitril -Valsartan ] Other (See Comments)    hypotension   Fosamax [Alendronate Sodium] Nausea Only   Jardiance  [Empagliflozin ]     Caused a vaginal infection   Meperidine Nausea And Vomiting   Microplegia Msa-Msg [Plegisol]     Loopy,diarrhea   Rosuvastatin Other (See Comments)    Reaction:  Muscle spasms    Tetracycline Hives   Adhesive [Tape] Rash   Lovastatin Rash and Other (See Comments)    Muscle Pain

## 2024-06-13 NOTE — Progress Notes (Signed)
 Kathleen [name] to be D/C'd Skilled nursing facility per MD order.  Discussed with the patient and all questions fully answered.  VSS, Skin clean, dry and intact without evidence of skin break down, no evidence of skin tears noted. IV catheter discontinued intact. Site without signs and symptoms of complications. Dressing and pressure applied.  An After Visit Summary, and signed prescription were placed in the d/c packet for receiving facility.  Report called to Narjette, LPN who will resume care of the patient at [facility].  Patient instructed to return to ED, call 911, or call MD for any changes in condition.   Patient escorted via stretcher, and D/C to [facility] via non emergency ambulance.  Kathleen [name] 06/13/2024 11:42 AM

## 2024-06-13 NOTE — Care Management Important Message (Signed)
 Important Message  Patient Details IM Letter given. Name: Kathleen [name] MRN: [id number] Date of Birth: June 27, 1945   Important Message Given:  Yes - Medicare IM     Melba Ates 06/13/2024, 10:28 AM

## 2024-06-14 ENCOUNTER — Telehealth: Admitting: *Deleted

## 2024-06-14 ENCOUNTER — Ambulatory Visit: Admitting: Sports Medicine

## 2024-06-16 ENCOUNTER — Ambulatory Visit: Admitting: Sports Medicine

## 2024-06-16 ENCOUNTER — Telehealth: Payer: Self-pay | Admitting: Orthopedic Surgery

## 2024-06-16 NOTE — Telephone Encounter (Signed)
 Pt called wanting to know if we have a written radiology report. [facility] in [place], Dr. Gordy Drones is wanting to know. Call back number is [phone].

## 2024-06-19 ENCOUNTER — Ambulatory Visit: Admitting: Sports Medicine

## 2024-06-19 ENCOUNTER — Ambulatory Visit: Admitting: Orthopedic Surgery

## 2024-06-20 ENCOUNTER — Encounter (HOSPITAL_COMMUNITY): Payer: Self-pay

## 2024-06-20 ENCOUNTER — Telehealth: Payer: Self-pay

## 2024-06-20 ENCOUNTER — Ambulatory Visit (HOSPITAL_COMMUNITY)

## 2024-06-20 NOTE — Telephone Encounter (Signed)
 Copied from CRM [id number]. Topic: General - Other >> Jun 20, 2024  3:20 PM Suzen RAMAN wrote: Reason for CRM: Holli called to inform provider that [facility] will be following patient after discharge and will be calling provider back after initial evaluation is completed for patient to request verbal orders.

## 2024-06-26 ENCOUNTER — Telehealth: Payer: Self-pay

## 2024-06-26 ENCOUNTER — Ambulatory Visit (INDEPENDENT_AMBULATORY_CARE_PROVIDER_SITE_OTHER)

## 2024-06-26 ENCOUNTER — Encounter (HOSPITAL_BASED_OUTPATIENT_CLINIC_OR_DEPARTMENT_OTHER): Payer: Self-pay

## 2024-06-26 VITALS — BP 122/61 | HR 80 | Ht 60.0 in | Wt 187.0 lb

## 2024-06-26 DIAGNOSIS — Z87891 Personal history of nicotine dependence: Secondary | ICD-10-CM | POA: Diagnosis not present

## 2024-06-26 DIAGNOSIS — K435 Parastomal hernia without obstruction or  gangrene: Secondary | ICD-10-CM

## 2024-06-26 DIAGNOSIS — Z8709 Personal history of other diseases of the respiratory system: Secondary | ICD-10-CM | POA: Diagnosis not present

## 2024-06-26 DIAGNOSIS — J449 Chronic obstructive pulmonary disease, unspecified: Secondary | ICD-10-CM

## 2024-06-26 DIAGNOSIS — J101 Influenza due to other identified influenza virus with other respiratory manifestations: Secondary | ICD-10-CM

## 2024-06-26 DIAGNOSIS — J189 Pneumonia, unspecified organism: Secondary | ICD-10-CM | POA: Diagnosis not present

## 2024-06-26 DIAGNOSIS — J9611 Chronic respiratory failure with hypoxia: Secondary | ICD-10-CM

## 2024-06-26 DIAGNOSIS — J398 Other specified diseases of upper respiratory tract: Secondary | ICD-10-CM

## 2024-06-26 DIAGNOSIS — G4730 Sleep apnea, unspecified: Secondary | ICD-10-CM

## 2024-06-26 DIAGNOSIS — J4489 Other specified chronic obstructive pulmonary disease: Secondary | ICD-10-CM

## 2024-06-26 MED ORDER — LEVALBUTEROL TARTRATE 45 MCG/ACT IN AERO: 2.0000 | INHALATION_SPRAY | RESPIRATORY_TRACT | Status: DC | PRN

## 2024-06-26 NOTE — Progress Notes (Signed)
 "  @Patient  ID: Kathleen [name], female    DOB: 1946/02/17, 79 y.o.   MRN: [id number]  Chief Complaint  Patient presents with   COPD    Follow up     Referring provider: Joshua Debby CROME, MD  HPI: Discussed the use of AI scribe software for clinical note transcription with the patient, who gave verbal consent to proceed.  History of Present Illness ELYSABETH [name] is a 79 year old female who presents as a hospital follow up for pneumonia and subacute rehab stay.  She was hospitalized just before Christmas for five days with the flu and was discharged for four days before returning to the hospital with pneumonia, leading to a nine-day stay. Following this, she underwent a twelve-day rehabilitation stay at [facility], from which she was discharged last Thursday.  She feels extremely tired and not back to her baseline. She experiences a 'horrible cough,' raspiness, and coughs up yellow mucus. No fevers, chills, or sweats. Her appetite is good, and she has gained four pounds. She has a stoma (GI) with regular output but also reports a hernia that becomes prominent when standing which is uncomfortable.  Her current medications include Breo, which she finds effective, and she has a nebulizer for Yupelri  that she has not used in a few days. She does not use albuterol  or other rescue inhalers regularly but has used them in the past. She experiences shortness of breath with activity and prefers a puffer for portability.  She has a pacemaker and recalls a procedure at [facility] involving leads placed near her heart. She mentions a previous hospital stay of sixty-six days related to her stoma surgery.  Hospitalization: Admit date: 06/07/2024 Discharge date: 06/13/2024 79 year old with history of COPD and chronic hypoxemic failure on 4 L nasal cannula oxygen , A-fib on Eliquis  and Tikosyn , heart failure with preserved ejection fraction, recurrent diverticulitis status post colostomy who was recently  admitted to the hospital with influenza A infection and COPD exacerbation 12/22-12/26 and went home, was initially better.  Since last few days started having more cough and rhonchorous wheezing so came back to the emergency room. In the emergency room normotensive, normal temperature, blood pressure stable.  On 5 L oxygen .  Chest x-ray with possible retrocardiac opacity.  Clinically diffuse wheezing.  Patient was given DuoNebs, Solu-Medrol , IV Rocephin  and azithromycin  and admitted to the hospital due to significant symptoms.  Patient was treated with multimodal therapy, some improvement of symptoms today.  Needs to go to subacute rehab. 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 Dyana at [facility] interventional pulmonary and felt not to be a candidate for stent placement for tracheobronchomalacia  Kathleen [name] is a 79 year old female with chronic respiratory issues who presents with worsening shortness of breath and cough. She is accompanied by her daughter-in-law, Kathleen [name].   She experiences increased shortness of breath during light activities, such as watering plants or cooking breakfast, with exacerbation when cooking bacon. She uses Breo and a nebulizer for her respiratory condition and a BiPAP machine at night, though she finds the mask uncomfortable. She is on home oxygen  at 4.5 liters, increasing to 5 liters with a portable concentrator.   She has had a cough for the past three weeks, producing  yellow-green sputum, and experiences wheezing, particularly in the mornings. Swelling in her left nostril affects her breathing.   Her weight has increased by 5-6 pounds recently, correlating with increased difficulty in breathing. She adjusts her diuretic intake  based on weight fluctuations.   Colostomy status with persistent enterocutaneous fistula, with ongoing discharge and occasional bleeding. The fistula has not decreased in size over the past year, raising concerns about potential other sources of bleeding. CT planned.    Allergies[1]  Significant tests/ events reviewed  Chest XR 06/07/2024: IMPRESSION: 1. Increased retrocardiac airspace opacity, concerning for pneumonia. Left costophrenic angle blunting with possible trace left pleural effusion.Follow-up PA and lateral chest X-ray is recommended in 3-4 weeks following therapy to ensure resolution .  BiPAP titration study 01/2021 12/8 cm   ABG 03/2022 7.3 4/33/87 04/2022 CT sinuses diffuse paranasal sinus disease  03/2022 CTA chest neg  08/2021 CT chest without contrast emphysema, stable nodules   HRCT 01/2021 emphysema, tracheobronchomalacia , bland scarring both bases , new atelectasis/consolidation of the lingula with scattered groundglass in left upper lobe   01/2020  With exertion her lowest oxygen  saturation was 92% on room air. Her peak HR was 103. She walked a total of 27m which was 25% predicted     Spiro 01/2021 severe airway obstruction, ratio 59, FEV1 0.95/48%, FVC 1.61/64% Spiro 03/2024 ratio 62, FEV1 65%, no sig BD response   Past Medical History:  Diagnosis Date   Arthritis    Asthma    Blood transfusion without reported diagnosis 2015   in hospital   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HCC)    Cataract 2016   very small   CHF (congestive heart failure) (HCC)    Chronic renal insufficiency    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    Concussion 10/04/2021   COPD (chronic obstructive pulmonary disease) (HCC)    Diverticulitis    Emphysema of lung (HCC) 2013   lung collapsed   Gout    Hypertension    Longstanding persistent atrial fibrillation (HCC)    on Xarelto    Nonischemic cardiomyopathy (HCC)    Obesity    Oxygen   deficiency 2013   Pacemaker    Spontaneous pneumothorax 2013   Thyroid  disease 1990    Tobacco History: Tobacco Use History[2] Counseling given: Not Answered Tobacco comments: Former smoker 12/25/21   Outpatient Medications Prior to Visit  Medication Sig Dispense Refill   azelastine  (ASTELIN ) 0.1 % nasal spray Place 2 sprays into both nostrils 2 (two) times daily. 30 mL 5   BREO ELLIPTA  100-25 MCG/ACT AEPB inhale ONE PUFF into THE lungs daily. 60 each 6   chlorpheniramine-HYDROcodone  (TUSSIONEX) 10-8 MG/5ML Take 5 mLs by mouth every 12 (twelve) hours as needed for cough. 115 mL 0   Cholecalciferol  (VITAMIN D3) 50 MCG (2000 UT) TABS Take 2,000 Units by mouth at bedtime.     cyanocobalamin  (VITAMIN B12) 1000 MCG tablet Take 1,000 mcg by mouth daily.     docusate sodium  (COLACE) 100 MG capsule Take 100 mg by mouth 2 (two) times daily.     dofetilide  (TIKOSYN ) 250 MCG capsule TAKE ONE CAPSULE BY MOUTH EVERY MORNING AND AT BEDTIME. 120 capsule 0   ELIQUIS  5 MG TABS tablet Take 1 tablet (5 mg total) by mouth 2 (two) times daily. 60 tablet 11   febuxostat  (ULORIC ) 40 MG tablet Take 1 tablet (40 mg total) by mouth daily. 90 tablet 1   ferrous gluconate  (FERGON) 324 MG  tablet Take 1 tablet (324 mg total) by mouth daily with breakfast. 30 tablet 3   gabapentin  (NEURONTIN ) 300 MG capsule Take 1 capsule (300 mg total) by mouth at bedtime. 90 capsule 3   guaifenesin  (ROBITUSSIN) 100 MG/5ML syrup Take 200 mg by mouth 3 (three) times daily as needed for cough.     ketoconazole  (NIZORAL ) 2 % cream Apply 1 Application topically 2 (two) times daily. 60 g 2   Magnesium  500 MG TABS Take 500 mg by mouth every evening.     magnesium  oxide (MAG-OX) 400 (240 Mg) MG tablet Take 1 tablet (400 mg total) by mouth every evening.     mupirocin  ointment (BACTROBAN ) 2 % Apply 1 Application topically 2 (two) times daily. 30 g 3   naloxone  (NARCAN ) nasal spray 4 mg/0.1 mL Place 1 spray into the nose once as needed  (opioid overdose).     oxyCODONE -acetaminophen  (PERCOCET) 10-325 MG tablet Take 0.5-1 tablets by mouth See admin instructions. *1-3 times daily*     oxyCODONE -acetaminophen  (PERCOCET/ROXICET) 5-325 MG tablet Take 1-2 tablets by mouth every 8 (eight) hours as needed for moderate pain (pain score 4-6) or severe pain (pain score 7-10) (1 tab for moderate pain, 2 tab for severe pain). 30 tablet 0   OXYGEN  Inhale 4 L into the lungs continuous. Use with resmed ventilator     potassium chloride  (KLOR-CON ) 10 MEQ tablet Take 4 tablets (40 mEq total) by mouth daily.     predniSONE  (DELTASONE ) 20 MG tablet Take 2 tablets (40 mg total) by mouth daily with breakfast for 3 days, THEN 1 tablet (20 mg total) daily with breakfast for 3 days, THEN 1 tablet (20 mg total) daily with breakfast for 3 days, THEN 0.5 tablets (10 mg total) daily with breakfast for 3 days.     revefenacin  (YUPELRI ) 175 MCG/3ML nebulizer solution Take 175 mcg by nebulization daily.     spironolactone  (ALDACTONE ) 25 MG tablet Take 1 tablet (25 mg total) by mouth every evening. 90 tablet 3   torsemide  (DEMADEX ) 20 MG tablet Take 2 tablets (40 mg total) by mouth daily. 180 tablet 3   triamcinolone  cream (KENALOG ) 0.1 % Apply 1 Application topically 2 (two) times daily. 45 g 0   No facility-administered medications prior to visit.     Review of Systems: as per hpi  Constitutional:   No  weight loss, night sweats,  Fevers, chills, fatigue, or  lassitude.  HEENT:   No headaches,  Difficulty swallowing,  Tooth/dental problems, or  Sore throat,                No sneezing, itching, ear ache, nasal congestion, post nasal drip,   CV:  No chest pain,  Orthopnea, PND, swelling in lower extremities, anasarca, dizziness, palpitations, syncope.   GI  No heartburn, indigestion, abdominal pain, nausea, vomiting, diarrhea, change in bowel habits, loss of appetite, bloody stools.   Resp: No shortness of breath with exertion or at rest.  No excess  mucus, no productive cough,  No non-productive cough,  No coughing up of blood.  No change in color of mucus.  No wheezing.  No chest wall deformity  Skin: no rash or lesions.  GU: no dysuria, change in color of urine, no urgency or frequency.  No flank pain, no hematuria   MS:  No joint pain or swelling.  No decreased range of motion.  No back pain.    Physical Exam  BP (!) 146/71   Pulse 80  Ht 5' (1.524 m)   Wt 187 lb (84.8 kg)   SpO2 98% Comment: 5 liters  BMI 36.52 kg/m   GEN: A/Ox3; pleasant , NAD, well nourished.  Speaks in full sentences   HEENT:  Floyd/AT,  EACs-clear, TMs-wnl, NOSE-clear, THROAT-clear, no lesions, no postnasal drip or exudate noted.   NECK:  Supple w/ fair ROM; no JVD; normal carotid impulses w/o bruits; no thyromegaly or nodules palpated; no lymphadenopathy.    RESP  Clear  P & A; w/o, wheezes/ rales/ or rhonchi. no accessory muscle use, no dullness to percussion  CARD:  RRR, no m/r/g, no peripheral edema, pulses intact, no cyanosis or clubbing.  GI: nml bowel sounds; no organomegaly.  Ventral hernia visible above stoma; ostomy bag intact with thin stool present.   Musco: Warm bil, no deformities or joint swelling noted.   Neuro: alert, no focal deficits noted.    Skin: Warm, no lesions or rashes    Lab Results:  CBC    Component Value Date/Time   WBC 4.7 06/08/2024 0608   RBC 3.32 (L) 06/08/2024 0608   HGB 13.5 06/08/2024 0830   HGB 13.4 12/28/2012 0748   HCT 41.9 06/08/2024 0830   HCT 39.2 12/28/2012 0748   PLT 135 (L) 06/08/2024 0608   PLT 186 12/28/2012 0748   MCV 88.9 06/08/2024 0608   MCV 87 12/28/2012 0748   MCH 27.7 06/08/2024 0608   MCHC 31.2 06/08/2024 0608   RDW 14.7 06/08/2024 0608   RDW 15.0 (H) 12/28/2012 0748   LYMPHSABS 1.0 06/08/2024 0608   LYMPHSABS 0.4 (L) 12/28/2012 0748   MONOABS 0.6 06/08/2024 0608   MONOABS 0.5 12/28/2012 0748   EOSABS 0.0 06/08/2024 0608   EOSABS 0.0 12/28/2012 0748   BASOSABS 0.0  06/08/2024 0608   BASOSABS 0.0 12/28/2012 0748    BMET    Component Value Date/Time   NA 139 06/12/2024 0518   NA 141 02/08/2024 1358   NA 130 (L) 12/28/2012 0748   K 4.1 06/12/2024 0518   K 4.6 12/28/2012 0748   CL 96 (L) 06/12/2024 0518   CL 95 (L) 12/28/2012 0748   CO2 29 06/12/2024 0518   CO2 23 12/28/2012 0748   GLUCOSE 137 (H) 06/12/2024 0518   GLUCOSE 391 (H) 12/28/2012 0748   BUN 29 (H) 06/12/2024 0518   BUN 15 02/08/2024 1358   BUN 32 (H) 12/28/2012 0748   CREATININE 0.78 06/12/2024 0518   CREATININE 0.86 01/27/2023 1137   CALCIUM  10.7 (H) 06/12/2024 0518   CALCIUM  14.3 (HH) 06/26/2019 1618   GFRNONAA >60 06/12/2024 0518   GFRNONAA 43 (L) 12/28/2012 0748   GFRAA 58 (L) 06/28/2019 0427   GFRAA 50 (L) 12/28/2012 0748    BNP    Component Value Date/Time   BNP 65.5 02/01/2024 1619    ProBNP No results found for: PROBNP  Imaging: DG Chest Port 1 View Result Date: 06/07/2024 EXAM: 1 VIEW(S) XRAY OF THE CHEST 06/07/2024 11:22:00 AM COMPARISON: 05/29/2024 CLINICAL HISTORY: Questionable sepsis - evaluate for abnormality FINDINGS: LINES, TUBES AND DEVICES: 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 LUNGS AND PLEURA: Patient is rotated. Hyperinflation of the lungs. Chronic coarse and interstitial markings. Increase of retrocardiac airspace opacity. Left costophrenic angle blunting with possible trace left pleural effusion. No pneumothorax. HEART AND MEDIASTINUM: Unchanged cardiomediastinal silhouette. Atherosclerotic plaque. BONES AND SOFT TISSUES: Degenerative changes in both shoulders. No acute osseous abnormality. IMPRESSION: 1. Increased retrocardiac airspace opacity, concerning for pneumonia. Left costophrenic angle blunting  with possible trace left pleural effusion.Follow-up PA and lateral chest X-ray is recommended in 3-4 weeks following therapy to ensure resolution . Electronically signed by: Morgane Naveau MD 06/07/2024 12:27 PM EST RP Workstation:  [id number]   CT Angio Chest PE W and/or Wo Contrast Result Date: 05/29/2024 CLINICAL DATA:  Cough, shortness of breath and fever. EXAM: CT ANGIOGRAPHY CHEST WITH CONTRAST TECHNIQUE: Multidetector CT imaging of the chest was performed using the standard protocol during bolus administration of intravenous contrast. Multiplanar CT image reconstructions and MIPs were obtained to evaluate the vascular anatomy. RADIATION DOSE REDUCTION: This exam was performed according to the departmental dose-optimization program which includes automated exposure control, adjustment of the mA and/or kV according to patient size and/or use of iterative reconstruction technique. CONTRAST:  80mL OMNIPAQUE  IOHEXOL  350 MG/ML SOLN COMPARISON:  Radiograph earlier today.  Chest CT 03/30/2023 FINDINGS: Cardiovascular: There are no filling defects within the pulmonary arteries to suggest pulmonary embolus. CardioMEMS in the left lower lobe pulmonary artery. Atherosclerosis of the thoracic aorta. No aortic aneurysm. Limited aortic assessment, but no findings to suggest acute aortic injury. 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 The heart is mildly enlarged. There are coronary artery calcifications. No pericardial effusion. Mediastinum/Nodes: Shotty mediastinal lymph nodes not enlarged by size criteria. No hilar adenopathy. Patulous upper esophagus. Lungs/Pleura: Moderate emphysema. Diffuse bronchial thickening, greatest in the bilateral lower lobes and right upper lobe. Bandlike subsegmental scarring in the lower lobes. No confluent opacity. 4 mm left upper lobe pulmonary nodule, series 6, image 41, unchanged from prior exam. Punctate left upper lobe nodule series 6, image 56, also unchanged. No new pulmonary nodules. No pleural effusion. Upper Abdomen: Small cyst in the central liver. Broad-based upper abdominal ventral abdominal wall hernia containing portions of the stomach, partially included in the field of view.  Cholecystectomy. No acute upper abdominal findings. Musculoskeletal: Degenerative change throughout the spine. Chronic shoulder arthropathy. There are no acute or suspicious osseous abnormalities. Review of the MIP images confirms the above findings. IMPRESSION: 1. No pulmonary embolus. 2. Moderate emphysema. Moderate bronchial thickening. Subsegmental bandlike atelectasis but no evidence of pneumonia. 3. Stable small left upper lobe pulmonary nodules, largest measuring 4 mm. These nodules can be considered benign and no specific imaging follow-up. Aortic Atherosclerosis (ICD10-I70.0) and Emphysema (ICD10-J43.9). Electronically Signed   By: Andrea Gasman M.D.   On: 05/29/2024 19:44   DG Chest 2 View Result Date: 05/29/2024 CLINICAL DATA:  Cough. EXAM: CHEST - 2 VIEW COMPARISON:  Chest CT dated 01/19/2024 FINDINGS: Background of emphysema. There are bibasilar atelectasis/scarring. Pneumonia is not excluded. There is mild cardiomegaly. Left pectoral pacemaker device. No acute osseous pathology. IMPRESSION: Bibasilar atelectasis/scarring. Pneumonia is not excluded. Electronically Signed   By: Vanetta Chou M.D.   On: 05/29/2024 13:48    Administration History     None          Latest Ref Rng & Units 03/30/2024    1:52 PM  PFT Results  FVC-Pre L 1.77   FVC-Predicted Pre % 78   FVC-Post L 1.83   FVC-Predicted Post % 80   Pre FEV1/FVC % % 62   Post FEV1/FCV % % 65   FEV1-Pre L 1.10   FEV1-Predicted Pre % 65   FEV1-Post L 1.19     No results found for: NITRICOXIDE   Assessment & Plan:   Assessment & Plan COPD with asthma (HCC)  Chronic respiratory failure with hypoxia (HCC)  Tracheobronchomalacia  Sleep apnea, unspecified type  [employer]  acquired pneumonia of left lower lobe of lung  Influenza A   Assessment & Plan Community-acquired pneumonia, left lower lobe Recent hospitalization for left lower lobe pneumonia.  Repeat imaging needed to confirm complete  resolution. - Ordered repeat chest x-ray in 3-4 weeks to ensure resolution of pneumonia.  Chronic obstructive pulmonary disease COPD managed with Breo. Symptoms include shortness of breath and mucus production. Yupelri  recommended for help with mucus production. Xopenex  prescribed due to potential cardiac side effects of albuterol . - Continue Breo. - Resume Yupelri  once daily. - Prescribed Xopenex  as needed for shortness of breath.  Recent flu infection treated during hospitalization.  Parastomal hernia Causing discomfort and pressure on the lungs. Hernia is large and worsens with standing. Previous surgeon advised against surgical intervention due to high risk. - Recommended follow-up with surgeon for evaluation of parastomal hernia.    Return in about 6 weeks (around 08/07/2024).  Candis Dandy, PA-C 06/26/2024      [1]  Allergies Allergen Reactions   Allopurinol Other (See Comments)    Reaction:  Dizziness    Clindamycin Anaphylaxis and Hives   Flublok [Influenza Vaccine Recombinant] Other (See Comments)    Fever 103 with no alternative explanation day after vaccine.  Darice Highfill FNP-C   Pneumococcal 13-Val Conj Vacc Itching, Swelling and Rash   Dronedarone Rash   Montelukast  Other (See Comments)    Makes her loopy.   Brovana  [Arformoterol ]     Caused muscle pain   Budesonide      Caused extreme joint pain   Entresto  [Sacubitril -Valsartan ] Other (See Comments)    hypotension   Fosamax [Alendronate Sodium] Nausea Only   Jardiance  [Empagliflozin ]     Caused a vaginal infection   Meperidine Nausea And Vomiting   Microplegia Msa-Msg [Plegisol]     Loopy,diarrhea   Rosuvastatin Other (See Comments)    Reaction:  Muscle spasms    Tetracycline Hives   Adhesive [Tape] Rash   Lovastatin Rash and Other (See Comments)    Muscle Pain  [2]  Social History Tobacco Use  Smoking Status Former   Current packs/day: 0.00   Average packs/day: 1 pack/day for 20.0 years (20.0  ttl pk-yrs)   Types: Cigarettes   Start date: 06/09/1967   Quit date: 06/09/1987   Years since quitting: 37.0   Passive exposure: Past  Smokeless Tobacco Never  Tobacco Comments   Former smoker 12/25/21   "

## 2024-06-26 NOTE — Patient Instructions (Signed)
 Continue Breo.  Resume Yupelri .  Use Xopenex  MDI 2 puffs inhaled every 6 hours as needed for shortness of breath.  Follow up Chest XR in one month.  Follow up in 6 weeks.  Continue cough and deep breathing exercises.  Follow up with GI/surg for hernia.

## 2024-06-26 NOTE — Telephone Encounter (Signed)
 Copied from CRM #[id number]. Topic: Clinical - Home Health Verbal Orders >> Jun 26, 2024 11:09 AM Alfonso ORN wrote: Caller/Agency: [place] with [facility]  Callback Number: [phone] Service Requested: Physical Therapy Frequency: one week one, two week 2 , 1 week 4 Any new concerns about the patient? No

## 2024-06-27 NOTE — Telephone Encounter (Signed)
 yes

## 2024-06-27 NOTE — Telephone Encounter (Signed)
 Can I give the verbals ?

## 2024-06-28 ENCOUNTER — Ambulatory Visit

## 2024-06-28 ENCOUNTER — Other Ambulatory Visit (HOSPITAL_BASED_OUTPATIENT_CLINIC_OR_DEPARTMENT_OTHER): Payer: Self-pay | Admitting: *Deleted

## 2024-06-28 DIAGNOSIS — I442 Atrioventricular block, complete: Secondary | ICD-10-CM | POA: Diagnosis not present

## 2024-06-28 MED ORDER — LEVALBUTEROL TARTRATE 45 MCG/ACT IN AERO: 2.0000 | INHALATION_SPRAY | RESPIRATORY_TRACT | 5 refills | Status: AC | PRN

## 2024-06-28 NOTE — Telephone Encounter (Signed)
 Noted. Hedda has reached out to us  in regards to this patient and verbal orders has been given.

## 2024-06-28 NOTE — Telephone Encounter (Signed)
 Kathleen [name] has been given.

## 2024-06-29 ENCOUNTER — Ambulatory Visit: Payer: Self-pay | Admitting: Cardiology

## 2024-06-29 ENCOUNTER — Telehealth (HOSPITAL_BASED_OUTPATIENT_CLINIC_OR_DEPARTMENT_OTHER): Payer: Self-pay

## 2024-06-29 LAB — CUP PACEART REMOTE DEVICE CHECK
Battery Remaining Longevity: 59 mo
Battery Voltage: 2.96 V
Brady Statistic AP VP Percent: 82.44 %
Brady Statistic AP VS Percent: 0.02 %
Brady Statistic AS VP Percent: 17.54 %
Brady Statistic AS VS Percent: 0 %
Brady Statistic RA Percent Paced: 82.35 %
Brady Statistic RV Percent Paced: 99.98 %
Date Time Interrogation Session: 20260121041933
Implantable Lead Connection Status: 753985
Implantable Lead Connection Status: 753985
Implantable Lead Connection Status: 753985
Implantable Lead Implant Date: 20150527
Implantable Lead Implant Date: 20190109
Implantable Lead Implant Date: 20211221
Implantable Lead Location: 753858
Implantable Lead Location: 753859
Implantable Lead Location: 753860
Implantable Lead Model: 3830
Implantable Lead Model: 5076
Implantable Lead Model: 5076
Implantable Pulse Generator Implant Date: 20211221
Lead Channel Impedance Value: 285 Ohm
Lead Channel Impedance Value: 323 Ohm
Lead Channel Impedance Value: 323 Ohm
Lead Channel Impedance Value: 361 Ohm
Lead Channel Impedance Value: 399 Ohm
Lead Channel Impedance Value: 456 Ohm
Lead Channel Impedance Value: 475 Ohm
Lead Channel Impedance Value: 494 Ohm
Lead Channel Impedance Value: 494 Ohm
Lead Channel Pacing Threshold Amplitude: 0.75 V
Lead Channel Pacing Threshold Amplitude: 0.75 V
Lead Channel Pacing Threshold Amplitude: 0.875 V
Lead Channel Pacing Threshold Pulse Width: 0.4 ms
Lead Channel Pacing Threshold Pulse Width: 0.4 ms
Lead Channel Pacing Threshold Pulse Width: 0.4 ms
Lead Channel Sensing Intrinsic Amplitude: 1.25 mV
Lead Channel Sensing Intrinsic Amplitude: 1.25 mV
Lead Channel Sensing Intrinsic Amplitude: 6.25 mV
Lead Channel Sensing Intrinsic Amplitude: 6.25 mV
Lead Channel Setting Pacing Amplitude: 1.25 V
Lead Channel Setting Pacing Amplitude: 1.5 V
Lead Channel Setting Pacing Amplitude: 2 V
Lead Channel Setting Pacing Pulse Width: 0.4 ms
Lead Channel Setting Pacing Pulse Width: 0.4 ms
Lead Channel Setting Sensing Sensitivity: 2 mV
Zone Setting Status: 755011

## 2024-06-29 NOTE — Telephone Encounter (Signed)
 Rx sent to pharmacy and pt notified     Copied from CRM #[id number]. Topic: Clinical - Medication Question >> Jun 28, 2024 12:11 PM LaVerne A wrote: Reason for CRM: Patient saw Candis Dandy yesterday and she was supposed to send prescription for levalbuterol  (XOPENEX  HFA) 45 MCG/ACT inhaler.  It is suppose to be sent to: [facility] [place], [place] - [address] [place] [address] Phone: [phone] Fax: [phone] Hours: Not open 24 hours  The pharmacy does not have the prescription as yet.  Please let patient know what the status is by calling [phone].  Thanks.

## 2024-07-01 NOTE — Progress Notes (Signed)
 Remote PPM Transmission

## 2024-07-03 ENCOUNTER — Ambulatory Visit (HOSPITAL_COMMUNITY): Admitting: Internal Medicine

## 2024-07-06 ENCOUNTER — Ambulatory Visit (HOSPITAL_COMMUNITY)
Admission: RE | Admit: 2024-07-06 | Discharge: 2024-07-06 | Disposition: A | Discharge status: Home / Self Care | Source: Ambulatory Visit | Attending: Internal Medicine | Admitting: Internal Medicine

## 2024-07-06 ENCOUNTER — Encounter (HOSPITAL_COMMUNITY): Payer: Self-pay | Admitting: Internal Medicine

## 2024-07-06 VITALS — BP 110/70 | HR 71 | Wt 186.0 lb

## 2024-07-06 DIAGNOSIS — I5042 Chronic combined systolic (congestive) and diastolic (congestive) heart failure: Secondary | ICD-10-CM | POA: Diagnosis not present

## 2024-07-06 DIAGNOSIS — J9611 Chronic respiratory failure with hypoxia: Secondary | ICD-10-CM | POA: Diagnosis not present

## 2024-07-06 DIAGNOSIS — I130 Hypertensive heart and chronic kidney disease with heart failure and stage 1 through stage 4 chronic kidney disease, or unspecified chronic kidney disease: Secondary | ICD-10-CM | POA: Diagnosis not present

## 2024-07-06 DIAGNOSIS — Z6836 Body mass index (BMI) 36.0-36.9, adult: Secondary | ICD-10-CM | POA: Diagnosis not present

## 2024-07-06 DIAGNOSIS — Z9981 Dependence on supplemental oxygen: Secondary | ICD-10-CM | POA: Diagnosis not present

## 2024-07-06 DIAGNOSIS — N189 Chronic kidney disease, unspecified: Secondary | ICD-10-CM | POA: Diagnosis not present

## 2024-07-06 DIAGNOSIS — Z87891 Personal history of nicotine dependence: Secondary | ICD-10-CM | POA: Insufficient documentation

## 2024-07-06 DIAGNOSIS — J398 Other specified diseases of upper respiratory tract: Secondary | ICD-10-CM | POA: Diagnosis not present

## 2024-07-06 DIAGNOSIS — E669 Obesity, unspecified: Secondary | ICD-10-CM

## 2024-07-06 DIAGNOSIS — N823 Fistula of vagina to large intestine: Secondary | ICD-10-CM | POA: Insufficient documentation

## 2024-07-06 DIAGNOSIS — I4819 Other persistent atrial fibrillation: Secondary | ICD-10-CM

## 2024-07-06 DIAGNOSIS — I5022 Chronic systolic (congestive) heart failure: Secondary | ICD-10-CM | POA: Insufficient documentation

## 2024-07-06 DIAGNOSIS — J8489 Other specified interstitial pulmonary diseases: Secondary | ICD-10-CM | POA: Insufficient documentation

## 2024-07-06 DIAGNOSIS — G4733 Obstructive sleep apnea (adult) (pediatric): Secondary | ICD-10-CM | POA: Insufficient documentation

## 2024-07-06 DIAGNOSIS — I2510 Atherosclerotic heart disease of native coronary artery without angina pectoris: Secondary | ICD-10-CM | POA: Diagnosis not present

## 2024-07-06 DIAGNOSIS — Z79899 Other long term (current) drug therapy: Secondary | ICD-10-CM | POA: Diagnosis not present

## 2024-07-06 NOTE — Progress Notes (Signed)
 "  ADVANCED HF CLINIC NOTE  Primary Care: Joshua Debby CROME, MD Primary Cardiologist: Dr. Orlin ([facility]) HF Cardiologist: Dr. Cherrie  HPI: [name] is a 79 y.o. 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 Recently moved from [place] to [place] [place] and referred by Dr. Orlin to establish cardiology care.   Previous echo with EF 40% but EF recovered. Echo ([facility]) 8/22 EF > 55% RV normal   She has been followed by Dr. Rosine and Orlin at [facility]. Has maintained NSR on Tikosyn .   RHC + LHC (01/17/20): RA 12, PA mean 29, PCWP 13, CI 2.2, Coronary angiogram without any stenosis  RHC (02/08/20): RA 8, PAM 24, wedge 16, CI 2.5   Zio 11/22: Predominantly AF with v-pacing with periods of sinus rhythm with V-pacing - avg HR of 75 bpm. 2. 2. Rare PACs and PVCs.   Seen in AF clinic 3/23, continued on dofetilide  250 bid.  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 Discharged to SNF  Here for f/u with her son. Back at [facility] Overall feeling better. But remains tired. Still with severe DOE. Edema well controlled. Wears O2 4-5L     Cardiac Studies - Echo 6/25: EF 45-50%, mild LVH, normal RV, Mild MR  - Echo 4/24 EF 35-40% RV ok  - Echo (10/23) and (11/23) EF 35-40% (Dr. Cherrie felt 40-45%). Having episodes of CP that can wake her up from sleep. No exertional CP.   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 30 mL 5   BREO ELLIPTA  100-25 MCG/ACT AEPB inhale ONE PUFF into THE lungs daily. 60 each 6   chlorpheniramine-HYDROcodone  (TUSSIONEX) 10-8 MG/5ML Take 5 mLs by mouth every 12 (twelve) hours as needed for cough. 115 mL 0   Cholecalciferol  (VITAMIN D3) 50 MCG (2000 UT) TABS Take 2,000 Units by mouth at bedtime.     cyanocobalamin  (VITAMIN B12) 1000 MCG tablet Take 1,000 mcg by mouth daily.     docusate sodium  (COLACE) 100 MG capsule Take 100 mg by mouth 2 (two) times daily.     dofetilide  (TIKOSYN ) 250  MCG capsule TAKE ONE CAPSULE BY MOUTH EVERY MORNING AND AT BEDTIME. 120 capsule 0   ELIQUIS  5 MG TABS tablet Take 1 tablet (5 mg total) by mouth 2 (two) times daily. 60 tablet 11   febuxostat  (ULORIC ) 40 MG tablet Take 1 tablet (40 mg total) by mouth daily. 90 tablet 1   ferrous gluconate  (FERGON) 324 MG tablet Take 1 tablet (324 mg total) by mouth daily with breakfast. 30 tablet 3   gabapentin  (NEURONTIN ) 300 MG capsule Take 1 capsule (300 mg total) by mouth at bedtime. 90 capsule 3   guaifenesin  (ROBITUSSIN) 100 MG/5ML syrup Take 200 mg by mouth 3 (three) times daily as needed for cough.     levalbuterol  (XOPENEX  HFA) 45 MCG/ACT inhaler Inhale 2 puffs into the lungs every 4  (four) hours as needed for wheezing. 15 g 5   magnesium  oxide (MAG-OX) 400 (240 Mg) MG tablet Take 1 tablet (400 mg total) by mouth every evening.     naloxone  (NARCAN ) nasal spray 4 mg/0.1 mL Place 1 spray into the nose once as needed (opioid overdose).     oxyCODONE -acetaminophen  (PERCOCET) 10-325 MG tablet Take 0.5-1 tablets by mouth See admin instructions. *1-3 times daily*     oxyCODONE -acetaminophen  (PERCOCET/ROXICET) 5-325 MG tablet Take 1-2 tablets by mouth every 8 (eight) hours as needed for moderate pain (pain score 4-6) or severe pain (pain score 7-10) (1 tab for moderate pain, 2 tab for severe pain). 30 tablet 0   OXYGEN  Inhale 4 L into the lungs continuous. Use with resmed ventilator     potassium chloride  (KLOR-CON ) 10 MEQ tablet Take 4 tablets (40 mEq total) by mouth daily. (Patient taking differently: Take 20 mEq by mouth daily.)     revefenacin  (YUPELRI ) 175 MCG/3ML nebulizer solution Take 175 mcg by nebulization daily.     spironolactone  (ALDACTONE ) 25 MG tablet Take 1 tablet (25 mg total) by mouth every evening. 90 tablet 3   torsemide  (DEMADEX ) 20 MG tablet Take 2 tablets (40 mg total) by mouth daily. 180 tablet 3   ketoconazole  (NIZORAL ) 2 % cream Apply 1 Application topically 2 (two) times daily. (Patient not taking: Reported on 07/06/2024) 60 g 2   Magnesium  500 MG TABS Take 500 mg by mouth every evening. (Patient not taking: Reported on 07/06/2024)     mupirocin  ointment (BACTROBAN ) 2 % Apply 1 Application topically 2 (two) times daily. (Patient not taking: Reported on 07/06/2024) 30 g 3   triamcinolone  cream (KENALOG ) 0.1 % Apply 1 Application topically 2 (two) times daily. (Patient not taking: Reported on 07/06/2024) 45 g 0   No current facility-administered medications for this encounter.   Allergies  Allergen Reactions   Allopurinol Other (See Comments)    Reaction:  Dizziness    Clindamycin Anaphylaxis and Hives   Flublok [Influenza Vaccine Recombinant] Other (See  Comments)    Fever 103 with no alternative explanation day after vaccine.  Darice Highfill FNP-C   Pneumococcal 13-Val Conj Vacc Itching, Swelling and Rash   Dronedarone Rash   Montelukast  Other (See Comments)    Makes her loopy.   Brovana  [Arformoterol ]     Caused muscle pain   Budesonide      Caused extreme joint pain   Entresto  [Sacubitril -Valsartan ] Other (See Comments)    hypotension   Fosamax [Alendronate Sodium] Nausea Only   Jardiance  [Empagliflozin ]     Caused a vaginal infection   Meperidine Nausea And Vomiting   Microplegia Msa-Msg [Plegisol]     Loopy,diarrhea  Rosuvastatin Other (See Comments)    Reaction:  Muscle spasms    Tetracycline Hives   Adhesive [Tape] Rash   Lovastatin Rash and Other (See Comments)    Muscle Pain   Social History   Socioeconomic History   Marital status: Widowed    Spouse name: Not on file   Number of children: 1   Years of education: Not on file   Highest education level: Not on file  Occupational History   Occupation: retired  Tobacco Use   Smoking status: Former    Current packs/day: 0.00    Average packs/day: 1 pack/day for 20.0 years (20.0 ttl pk-yrs)    Types: Cigarettes    Start date: 06/09/1967    Quit date: 06/09/1987    Years since quitting: 37.1    Passive exposure: Past   Smokeless tobacco: Never   Tobacco comments:    Former smoker 12/25/21  Vaping Use   Vaping status: Never Used  Substance and Sexual Activity   Alcohol use: No    Alcohol/week: 0.0 standard drinks of alcohol   Drug use: No   Sexual activity: Not Currently  Other Topics Concern   Not on file  Social History Narrative   Pt lives in [place] alone.  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  Her son works in [employer].   Social Drivers of Health   Tobacco Use: Medium Risk (07/06/2024)   Patient History    Smoking Tobacco Use: Former    Smokeless Tobacco Use: Never    Passive Exposure: Past  Physicist, Medical Strain: Low Risk  (09/15/2023)   Overall Financial Resource Strain (CARDIA)    Difficulty of Paying Living Expenses: Not hard at all  Food Insecurity: No Food Insecurity (06/08/2024)   Epic    Worried About Programme Researcher, Broadcasting/film/video in the Last Year: Never true    Ran Out of Food in the Last Year: Never true  Transportation Needs: No Transportation Needs (06/08/2024)   Epic    Lack of Transportation (Medical): No    Lack of Transportation (Non-Medical): No  Physical Activity: Insufficiently Active (09/15/2023)   Exercise Vital Sign    Days of Exercise per Week: 2 days    Minutes of Exercise per Session: 60 min  Stress: Stress Concern Present (09/15/2023)   [employer] of Occupational Health - Occupational Stress Questionnaire    Feeling of Stress : To some extent  Social Connections: Moderately Integrated (06/08/2024)   Social Connection and Isolation Panel    Frequency of Communication with Friends and Family: Three times a week    Frequency of Social Gatherings with Friends and Family: Not on file    Attends Religious Services: 1 to 4 times per year    Active Member of Clubs or Organizations: No    Attends Banker Meetings: 1 to 4 times per year    Marital Status: Widowed  Intimate Partner Violence: Not At Risk (06/08/2024)   Epic    Fear of Current or Ex-Partner: No    Emotionally Abused: No    Physically Abused: No    Sexually Abused: No  Depression (PHQ2-9): Low Risk (06/05/2024)   Depression (PHQ2-9)    PHQ-2 Score: 0  Alcohol Screen: Low Risk (09/15/2023)   Alcohol Screen    Last Alcohol Screening Score (AUDIT): 0  Housing: Low Risk (06/08/2024)   Epic    Unable to Pay for Housing in the Last Year: No    Number  of Times Moved in the Last Year: 0    Homeless in the Last Year: No  Utilities: Not At Risk (06/08/2024)   Epic    Threatened with loss of utilities: No  Health Literacy: Adequate Health Literacy (09/15/2023)   B1300 Health Literacy    Frequency of need for help with medical  instructions: Never   Family History  Problem Relation Age of Onset   Breast cancer Mother 17       3 different times   Pancreatic cancer Mother    Emphysema Father    Healthy Brother    Breast cancer Cousin    Valvular heart disease Son    Obesity Daughter    Colon cancer Neg Hx    Esophageal cancer Neg Hx    Stomach cancer Neg Hx    Inflammatory bowel disease Neg Hx    Liver disease Neg Hx    Rectal cancer Neg Hx    BP 110/70   Pulse 71   Wt 84.4 kg (186 lb)   SpO2 99% Comment: 5 l n/c  BMI 36.33 kg/m   Wt Readings from Last 3 Encounters:  07/06/24 84.4 kg (186 lb)  06/26/24 84.8 kg (187 lb)  06/07/24 85.6 kg (188 lb 11.4 oz)   PHYSICAL EXAM: General:  Obese woman wearing O2 HEENT: normal Neck: supple. no JVD.  Cor: Regular rate & rhythm. No rubs, gallops or murmurs. Lungs: clear but decreased Abdomen: obese soft, nontender, nondistended.Good bowel sounds. + ostomy Extremities: no cyanosis, clubbing, rash, edema Neuro: alert & orientedx3, cranial nerves grossly intact. moves all 4 extremities w/o difficulty. Affect pleasant  ECG: sinus 72 with vpacing QTC with paced rhythm Personally reviewed    MDT Device interrogation (personally reviewed):ICD: Impedance ok No VT/AF. Activity level 0.3hr/d 100% BiV pacing Personally reviewed  ASSESSMENT & PLAN:  1. 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 RV ok  - Echo (10/23 to 4/24) EF 35-40%.  - R/L cath (10/23):  minimal CAD.  - Echo 6/25: EF 45-50%, RV nl, mild MR - Chronic NYHA IIIb, functional class multifactorial due to resp failure and deconditioning. No change today - Continue torsemide  40 mg daily + 40 KCL daily. Discussed sliding scale diuretic regimen to use 40 bid for 1-2 days if weight increasing - Continue spiro 12.5 mg daily. - no ? blocker with severe lung disease. - no SGLT2i with frequent UTIs. - Last Cardiomems reading 10/23 but pillow is not working. Has not ben using - Recent labs  from 06/12/24 reviewed k 4.1 Scr 0.78  2. 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 Jude - Continue O2.  - Stable  3. Persistent AF - s/p AVN ablation and PM (failed upgrade to BiV) - Followed by EP/AF Clinic - Continue Tikosyn , per EP - Remains in NSR - QTC k  4. Morbid obesity  - Body mass index is 36.33 kg/m. - weight down after surgery  5. OSA - Reinforced need for compliance  6. H/o ostomy 11/24 - c/b colovaginal fistula - no change - site looks ok today   Toribio Fuel, MD  9:29 AM  "

## 2024-07-06 NOTE — Progress Notes (Signed)
 31 day ICM Remote transmission canceled due to [facility] clinic is on hold until further notice.  91 day remote monitoring will continue per protocol.

## 2024-07-06 NOTE — Patient Instructions (Addendum)
 Good to see you today!  Your physician recommends that you schedule a follow-up appointment in 3 months as scheduled   If you have any questions or concerns before your next appointment please send us  a message through [place] or call our office at [phone].    TO LEAVE A MESSAGE FOR THE NURSE SELECT OPTION 2, PLEASE LEAVE A MESSAGE INCLUDING: YOUR NAME DATE OF BIRTH CALL BACK NUMBER REASON FOR CALL**this is important as we prioritize the call backs  YOU WILL RECEIVE A CALL BACK THE SAME DAY AS LONG AS YOU CALL BEFORE 4:00 PM At the [facility], you and your health needs are our priority. As part of our continuing mission to provide you with exceptional heart care, we have created designated Provider Care Teams. These Care Teams include your primary Cardiologist (physician) and Advanced Practice Providers (APPs- Physician Assistants and Nurse Practitioners) who all work together to provide you with the care you need, when you need it.   You may see any of the following providers on your designated Care Team at your next follow up: Dr Toribio Fuel Dr Ezra Shuck Dr. Morene Brownie Greig Mosses, NP Caffie Shed, [place] [facility] Manuel Garcia, [place] Beckey Coe, NP Jordan Lee, NP Ellouise Class, NP Tinnie Redman, PharmD Jaun Bash, PharmD   Please be sure to bring in all your medications bottles to every appointment.    Thank you for choosing [facility]

## 2024-07-10 ENCOUNTER — Ambulatory Visit

## 2024-07-31 ENCOUNTER — Ambulatory Visit: Admitting: Internal Medicine

## 2024-08-07 ENCOUNTER — Ambulatory Visit (HOSPITAL_BASED_OUTPATIENT_CLINIC_OR_DEPARTMENT_OTHER)

## 2024-08-09 ENCOUNTER — Ambulatory Visit (HOSPITAL_COMMUNITY): Admitting: Physician Assistant

## 2024-08-16 ENCOUNTER — Ambulatory Visit: Admitting: Orthopedic Surgery

## 2024-09-13 ENCOUNTER — Ambulatory Visit: Admitting: Orthopedic Surgery

## 2024-09-18 ENCOUNTER — Ambulatory Visit

## 2024-09-25 ENCOUNTER — Ambulatory Visit: Admitting: Neurology

## 2024-09-27 ENCOUNTER — Ambulatory Visit

## 2024-10-04 ENCOUNTER — Ambulatory Visit (HOSPITAL_COMMUNITY)

## 2024-10-18 ENCOUNTER — Ambulatory Visit

## 2024-12-27 ENCOUNTER — Ambulatory Visit

## 2025-03-28 ENCOUNTER — Ambulatory Visit
